# Patient Record
Sex: Female | Born: 1955 | Race: White | Hispanic: No | Marital: Married | Smoking: Former smoker
Health system: Southern US, Community
[De-identification: ages and names within clinical notes are randomized; demographics above are authoritative.]

## PROBLEM LIST (undated history)

## (undated) ENCOUNTER — Inpatient Hospital Stay: Admission: EM | Payer: Self-pay | Source: Home / Self Care

## (undated) DIAGNOSIS — N189 Chronic kidney disease, unspecified: Secondary | ICD-10-CM

## (undated) DIAGNOSIS — H269 Unspecified cataract: Secondary | ICD-10-CM

## (undated) DIAGNOSIS — I509 Heart failure, unspecified: Secondary | ICD-10-CM

## (undated) DIAGNOSIS — K529 Noninfective gastroenteritis and colitis, unspecified: Secondary | ICD-10-CM

## (undated) DIAGNOSIS — R350 Frequency of micturition: Secondary | ICD-10-CM

## (undated) DIAGNOSIS — F419 Anxiety disorder, unspecified: Secondary | ICD-10-CM

## (undated) DIAGNOSIS — K5090 Crohn's disease, unspecified, without complications: Secondary | ICD-10-CM

## (undated) DIAGNOSIS — K5790 Diverticulosis of intestine, part unspecified, without perforation or abscess without bleeding: Secondary | ICD-10-CM

## (undated) DIAGNOSIS — Z5189 Encounter for other specified aftercare: Secondary | ICD-10-CM

## (undated) DIAGNOSIS — F32A Depression, unspecified: Secondary | ICD-10-CM

## (undated) DIAGNOSIS — K743 Primary biliary cirrhosis: Secondary | ICD-10-CM

## (undated) DIAGNOSIS — N390 Urinary tract infection, site not specified: Secondary | ICD-10-CM

## (undated) DIAGNOSIS — E119 Type 2 diabetes mellitus without complications: Secondary | ICD-10-CM

## (undated) DIAGNOSIS — M1990 Unspecified osteoarthritis, unspecified site: Secondary | ICD-10-CM

## (undated) DIAGNOSIS — D649 Anemia, unspecified: Secondary | ICD-10-CM

## (undated) DIAGNOSIS — R51 Headache: Secondary | ICD-10-CM

## (undated) DIAGNOSIS — I5189 Other ill-defined heart diseases: Secondary | ICD-10-CM

## (undated) DIAGNOSIS — R748 Abnormal levels of other serum enzymes: Secondary | ICD-10-CM

## (undated) DIAGNOSIS — R519 Headache, unspecified: Secondary | ICD-10-CM

## (undated) DIAGNOSIS — R112 Nausea with vomiting, unspecified: Secondary | ICD-10-CM

## (undated) DIAGNOSIS — N3010 Interstitial cystitis (chronic) without hematuria: Secondary | ICD-10-CM

## (undated) DIAGNOSIS — G459 Transient cerebral ischemic attack, unspecified: Secondary | ICD-10-CM

## (undated) DIAGNOSIS — Z9889 Other specified postprocedural states: Secondary | ICD-10-CM

## (undated) DIAGNOSIS — G2581 Restless legs syndrome: Secondary | ICD-10-CM

## (undated) DIAGNOSIS — F329 Major depressive disorder, single episode, unspecified: Secondary | ICD-10-CM

## (undated) HISTORY — DX: Noninfective gastroenteritis and colitis, unspecified: K52.9

## (undated) HISTORY — DX: Unspecified osteoarthritis, unspecified site: M19.90

## (undated) HISTORY — DX: Encounter for other specified aftercare: Z51.89

## (undated) HISTORY — DX: Headache, unspecified: R51.9

## (undated) HISTORY — DX: Type 2 diabetes mellitus without complications: E11.9

## (undated) HISTORY — DX: Anxiety disorder, unspecified: F41.9

## (undated) HISTORY — DX: Crohn's disease, unspecified, without complications: K50.90

## (undated) HISTORY — DX: Heart failure, unspecified: I50.9

## (undated) HISTORY — DX: Major depressive disorder, single episode, unspecified: F32.9

## (undated) HISTORY — PX: COLONOSCOPY: SHX174

## (undated) HISTORY — DX: Unspecified cataract: H26.9

## (undated) HISTORY — DX: Interstitial cystitis (chronic) without hematuria: N30.10

## (undated) HISTORY — DX: Transient cerebral ischemic attack, unspecified: G45.9

## (undated) HISTORY — DX: Headache: R51

## (undated) HISTORY — DX: Urinary tract infection, site not specified: N39.0

## (undated) HISTORY — DX: Depression, unspecified: F32.A

## (undated) HISTORY — PX: FL INJ LEFT KNEE CT ARTHROGRAM (ARMC HX): HXRAD1307

## (undated) HISTORY — DX: Frequency of micturition: R35.0

## (undated) HISTORY — PX: ABDOMINAL HYSTERECTOMY: SHX81

## (undated) HISTORY — PX: CARPAL TUNNEL RELEASE: SHX101

## (undated) HISTORY — PX: TONSILLECTOMY: SUR1361

## (undated) HISTORY — PX: TOTAL ABDOMINAL HYSTERECTOMY W/ BILATERAL SALPINGOOPHORECTOMY: SHX83

---
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
## 1994-07-30 HISTORY — PX: KNEE ARTHROSCOPY: SUR90

## 2000-09-21 ENCOUNTER — Emergency Department (HOSPITAL_COMMUNITY): Admission: EM | Admit: 2000-09-21 | Discharge: 2000-09-21 | Payer: Self-pay | Admitting: Emergency Medicine

## 2001-07-30 HISTORY — PX: CARPAL TUNNEL RELEASE: SHX101

## 2003-12-30 ENCOUNTER — Other Ambulatory Visit: Payer: Self-pay

## 2004-09-03 ENCOUNTER — Emergency Department: Payer: Self-pay | Admitting: Emergency Medicine

## 2005-03-26 ENCOUNTER — Ambulatory Visit: Payer: Self-pay | Admitting: General Practice

## 2005-03-30 ENCOUNTER — Ambulatory Visit: Payer: Self-pay | Admitting: General Practice

## 2005-04-23 ENCOUNTER — Ambulatory Visit: Payer: Self-pay | Admitting: Family Medicine

## 2005-04-23 IMAGING — CT CT ABD-PELV W/ CM
1 of 2 series · 15 of 32 positions shown, 20 images · non-contrast
Comparison: none

REASON FOR EXAM: ACUTE ABDOMINAL PAIN
 Call report to:[[email or web address]_NUMBER]
COMMENTS:

PROCEDURE:     CT  - CT ABDOMEN / PELVIS  W  - [DATE]  [DATE]
RESULT:     Standard IV and oral contrast enhanced CT of abdomen and pelvis
is obtained.

[Series 2: abdomen · axial · 0.81mm/px · z∈[-491,-75]mm · 15 of 58 slices shown, 20 images]
[im 3/58  soft-tissue]
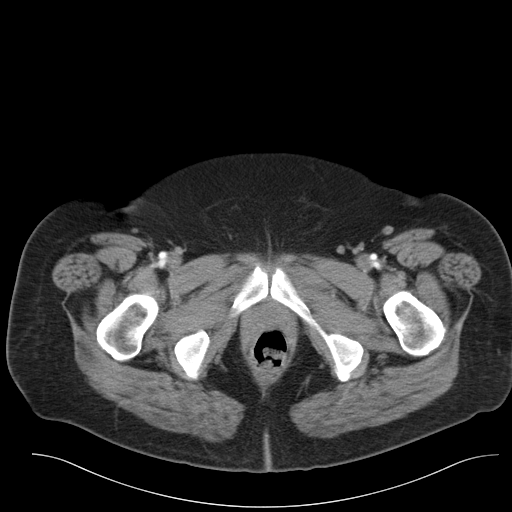
[im 3/58  bone]
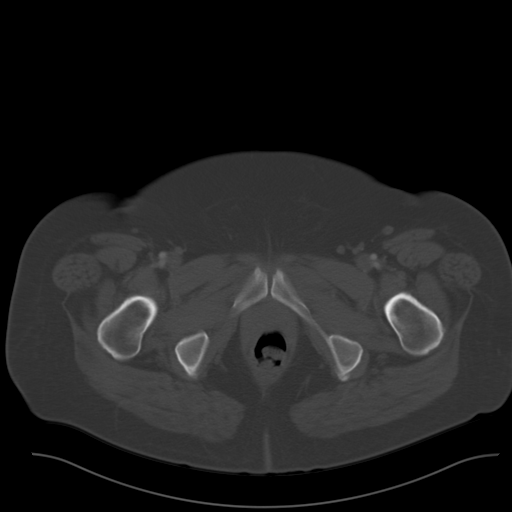
[im 8/58  soft-tissue]
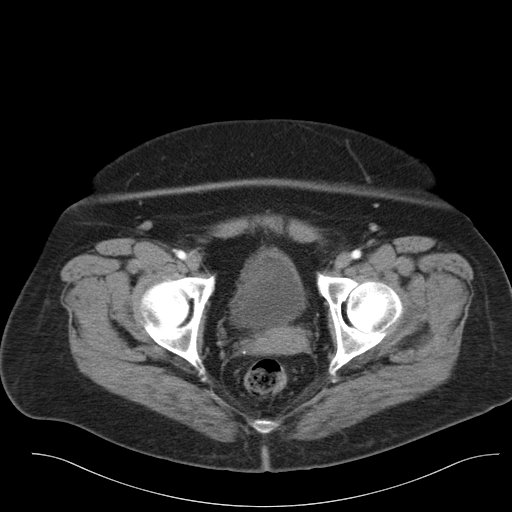
[im 10/58  soft-tissue]
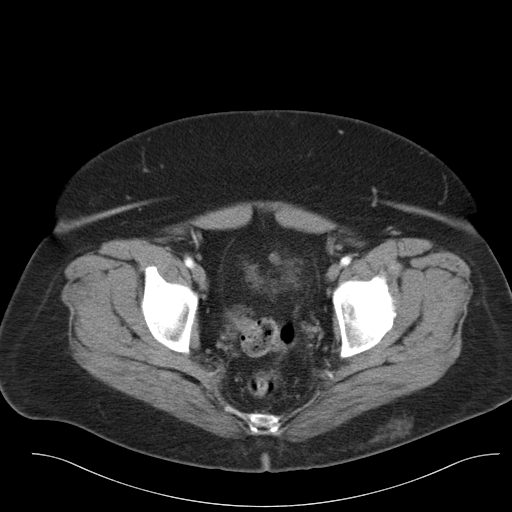
[im 15/58  soft-tissue]
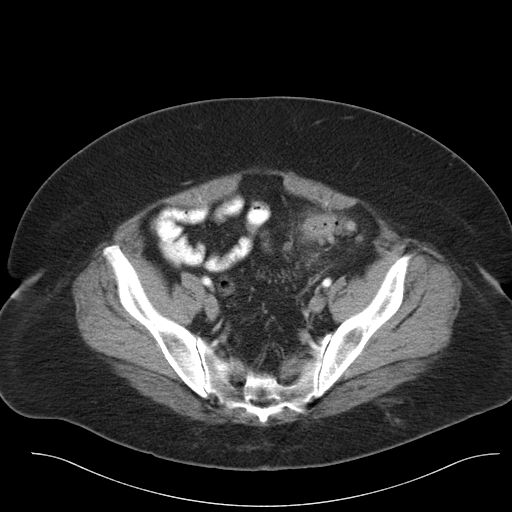
[im 20/58  soft-tissue]
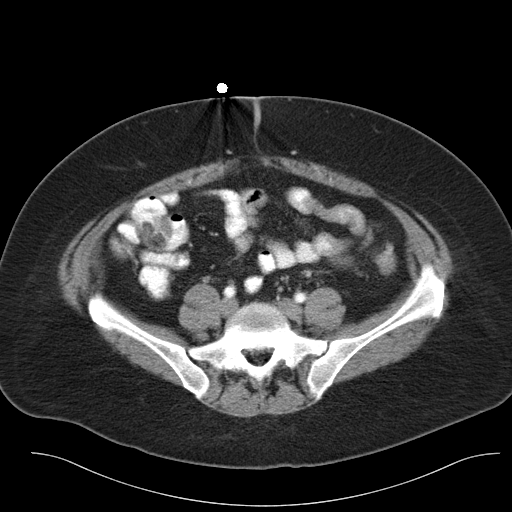
[im 23/58  soft-tissue]
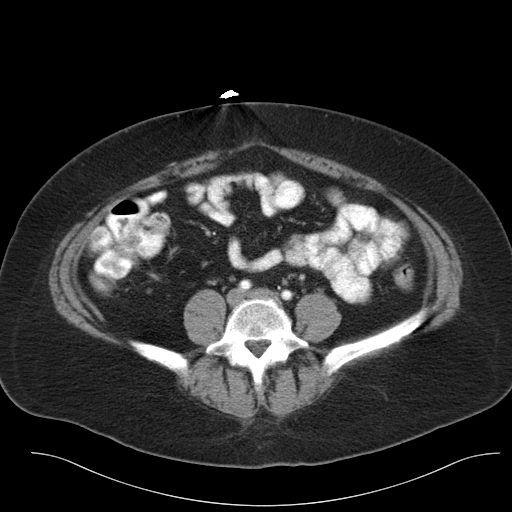
[im 28/58  soft-tissue]
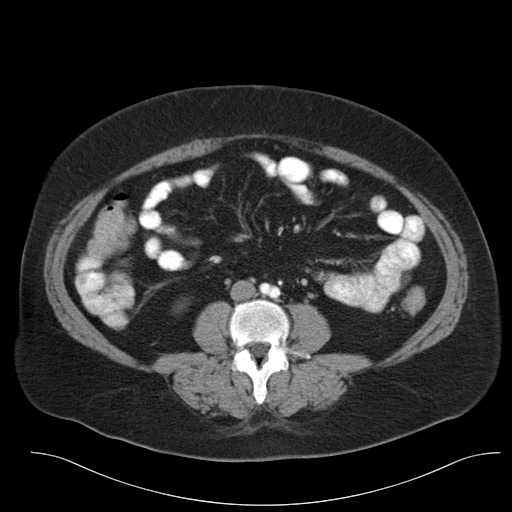
[im 30/58  soft-tissue]
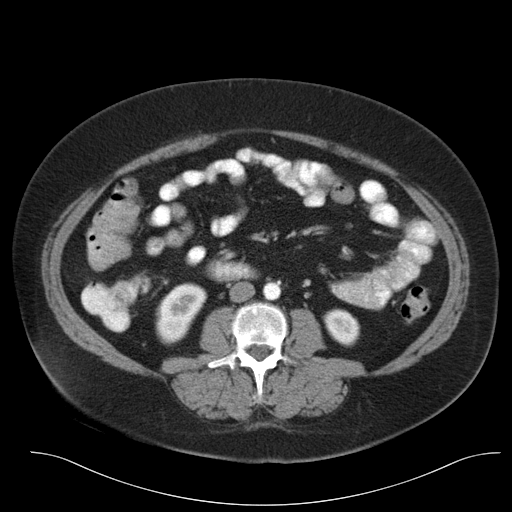
[im 35/58  soft-tissue]
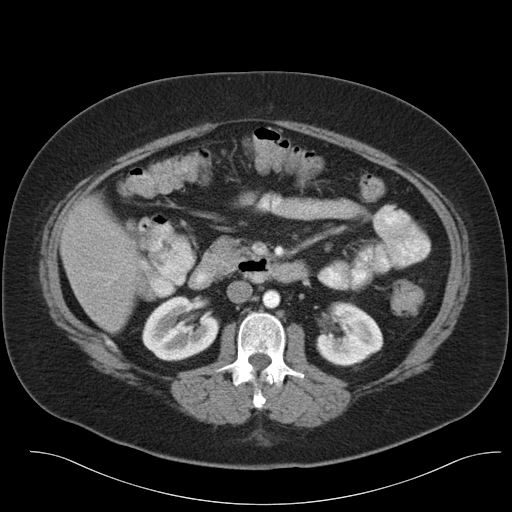
[im 35/58  bone]
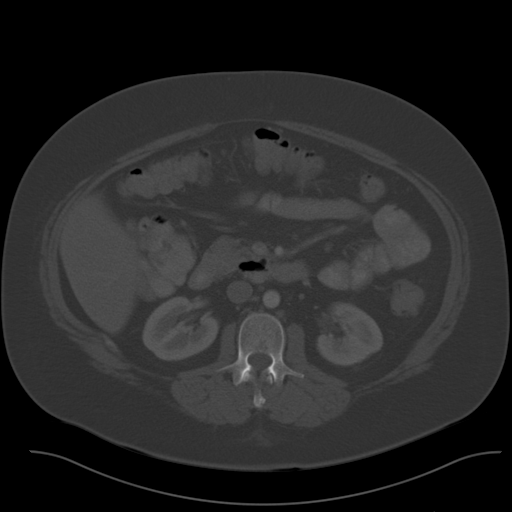
[im 38/58  soft-tissue]
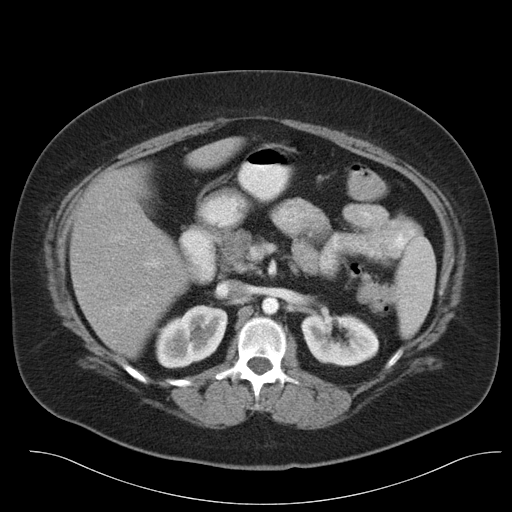
[im 43/58  soft-tissue]
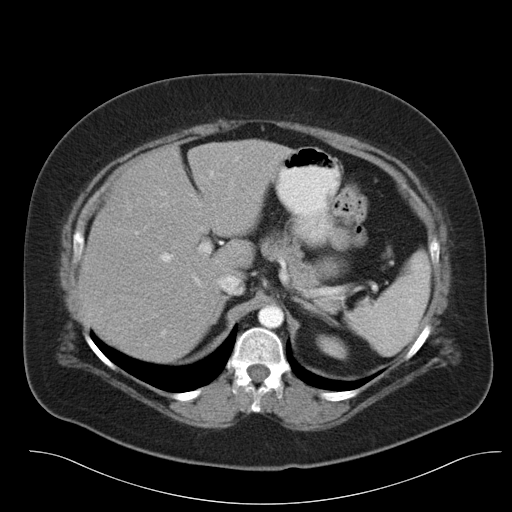
[im 48/58  soft-tissue]
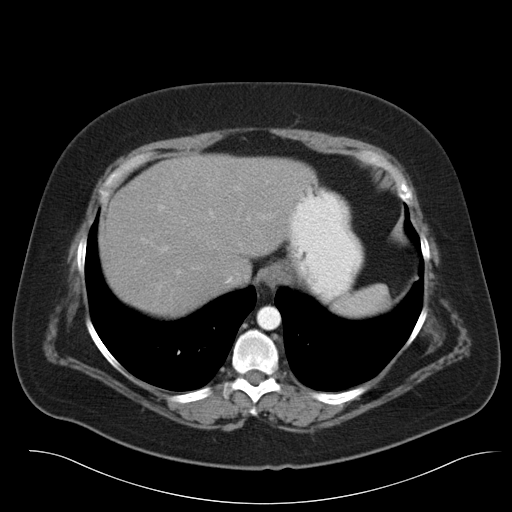
[im 48/58  lung]
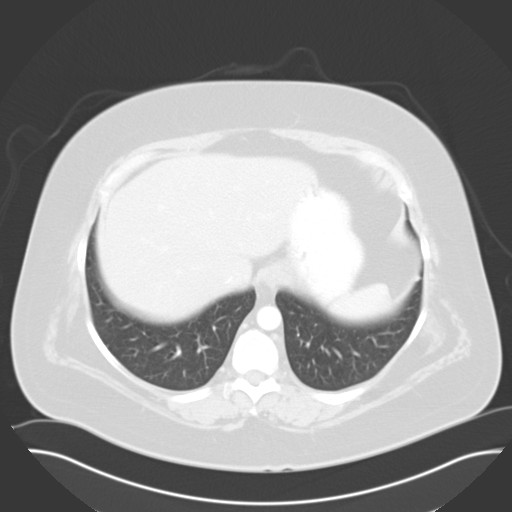
[im 50/58  soft-tissue]
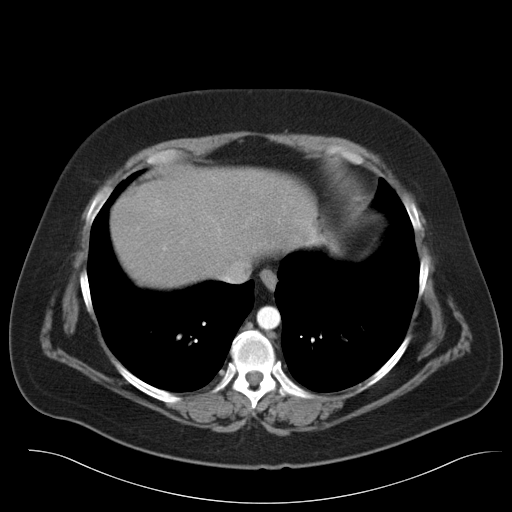
[im 50/58  lung]
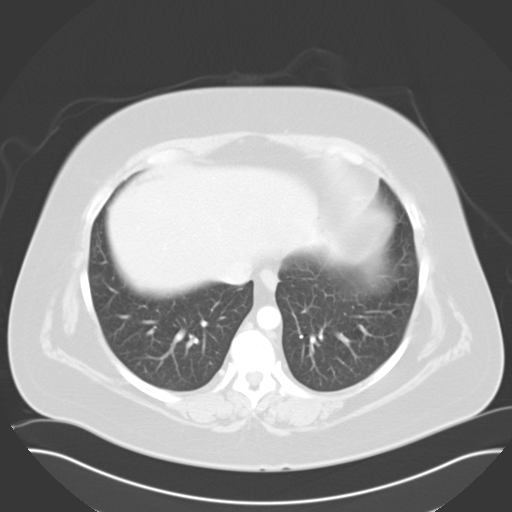
[im 53/58  lung]
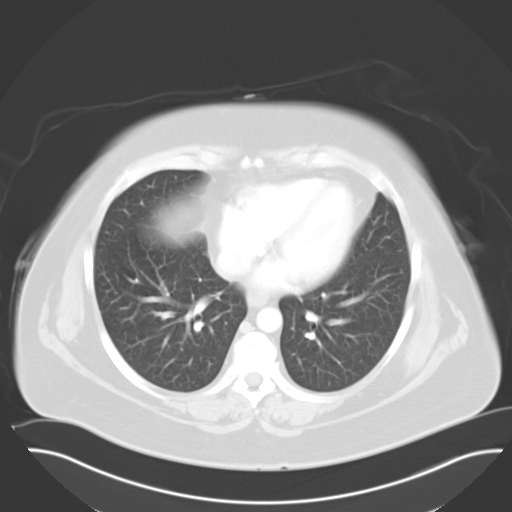
[im 55/58  soft-tissue]
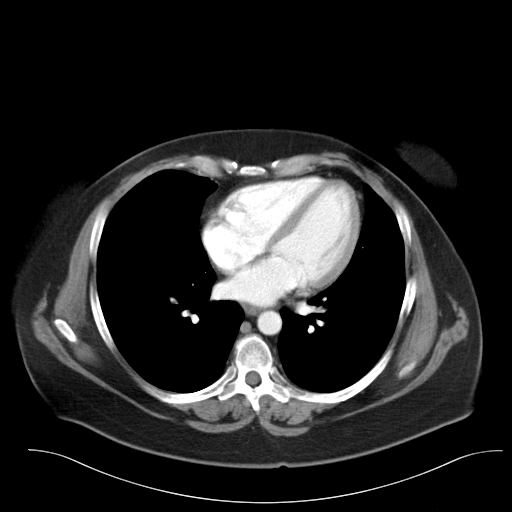
[im 55/58  lung]
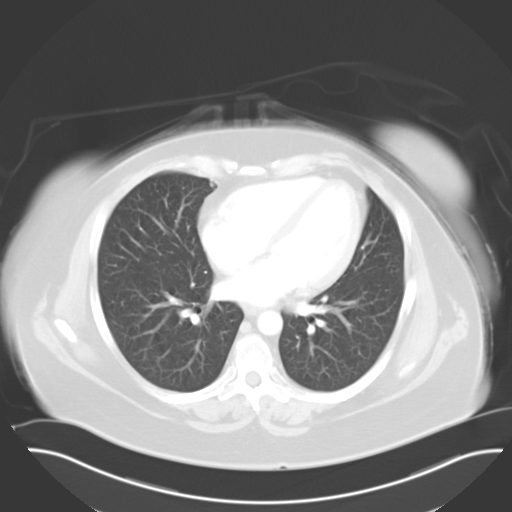

[15 of 32 positions shown; findings below may reference images not displayed]

FINDINGS: The liver and spleen are normal. The pancreas is normal. The
adrenals are normal. No focal renal abnormalities are identified. There is
no bowel distension. The RIGHT lower quadrant is unremarkable. Sigmoid
colonic diverticulosis is noted. Mild diverticulitis cannot be excluded as
there is mild perisigmoid colonic fat plane streaking. The pelvis is
otherwise unremarkable. No inguinal adenopathy is noted. The lung bases are
clear.
IMPRESSION: Subtle diverticulitis cannot be excluded.

This report was phoned to the physician at the time of the study.

## 2005-04-29 ENCOUNTER — Ambulatory Visit: Payer: Self-pay | Admitting: General Practice

## 2005-04-30 ENCOUNTER — Inpatient Hospital Stay: Payer: Self-pay | Admitting: Internal Medicine

## 2005-04-30 IMAGING — CR DG ABDOMEN 3V
1 series · 4 of 4 positions shown · non-contrast
Comparison: none

REASON FOR EXAM: please do tonight  abdominal pain
COMMENTS:

[Series 1: view not recorded · 0.17mm/px · 4 of 4 slices shown]
[im 1/4]
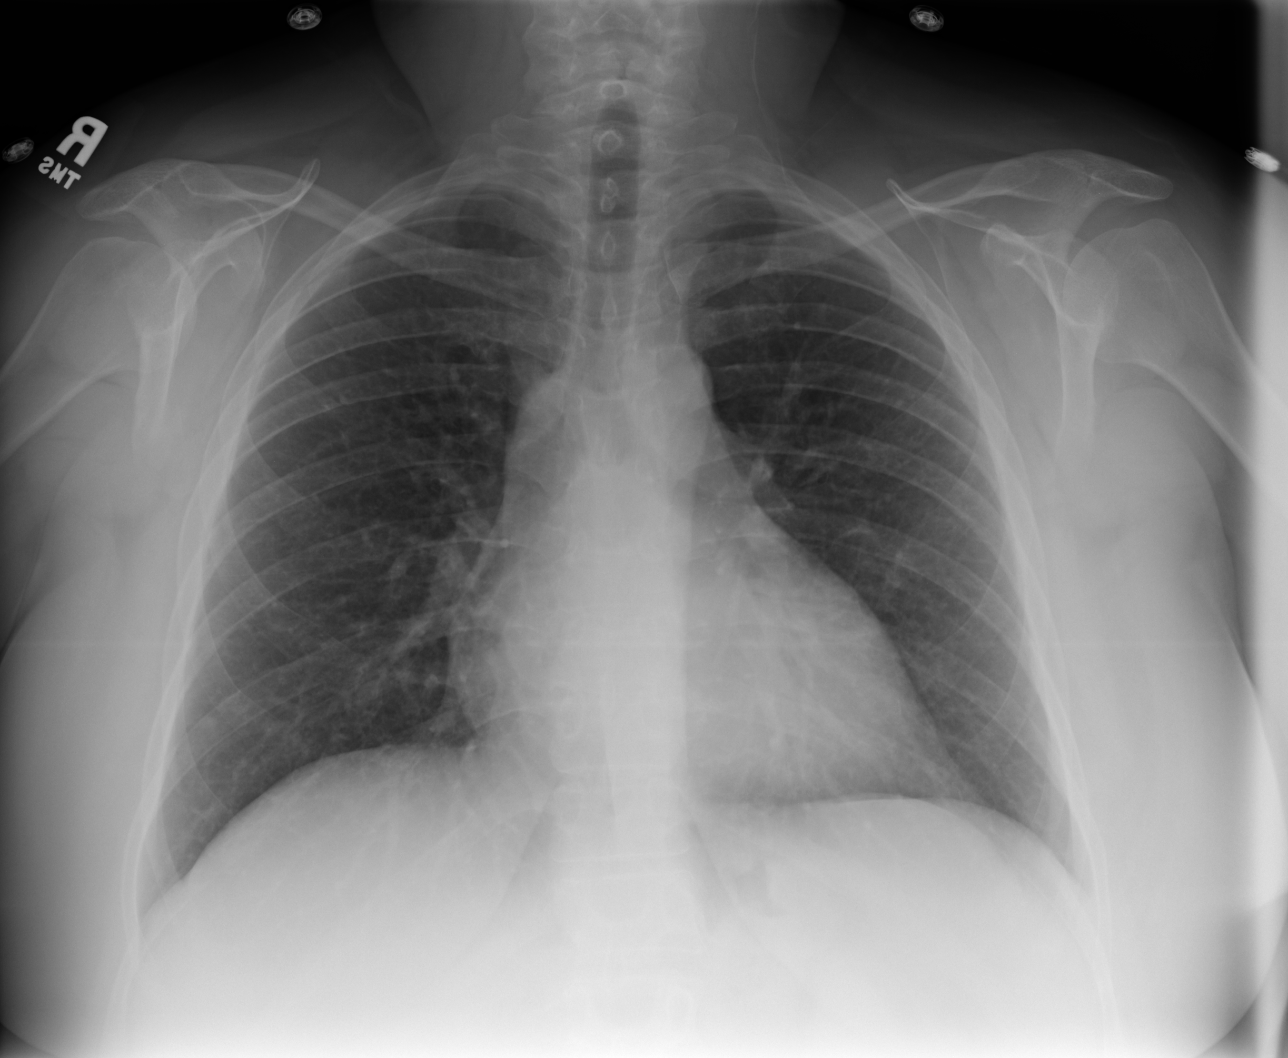
[im 2/4]
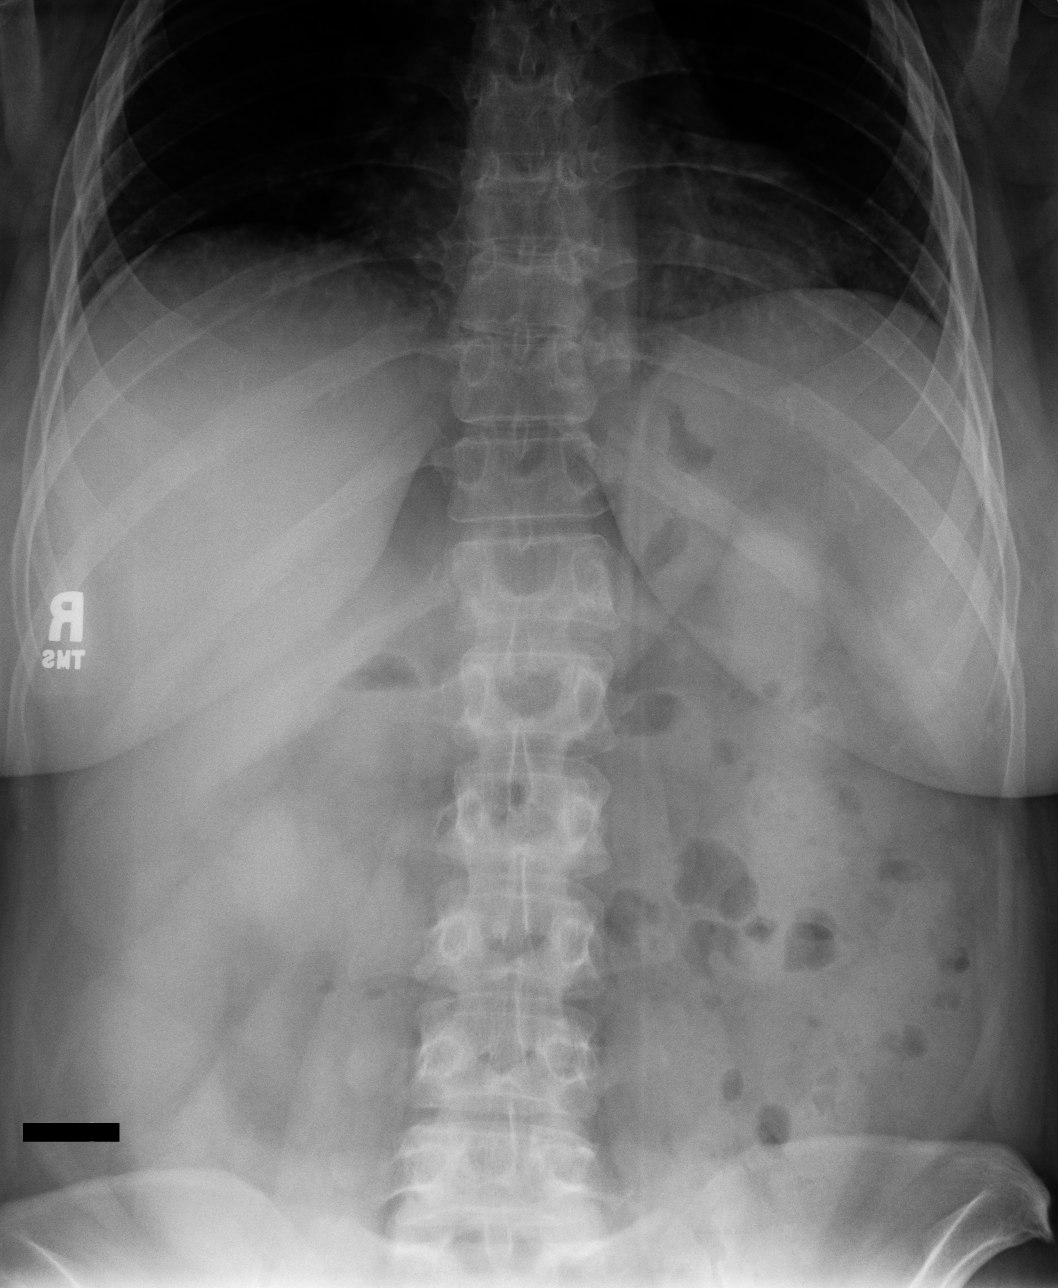
[im 3/4]
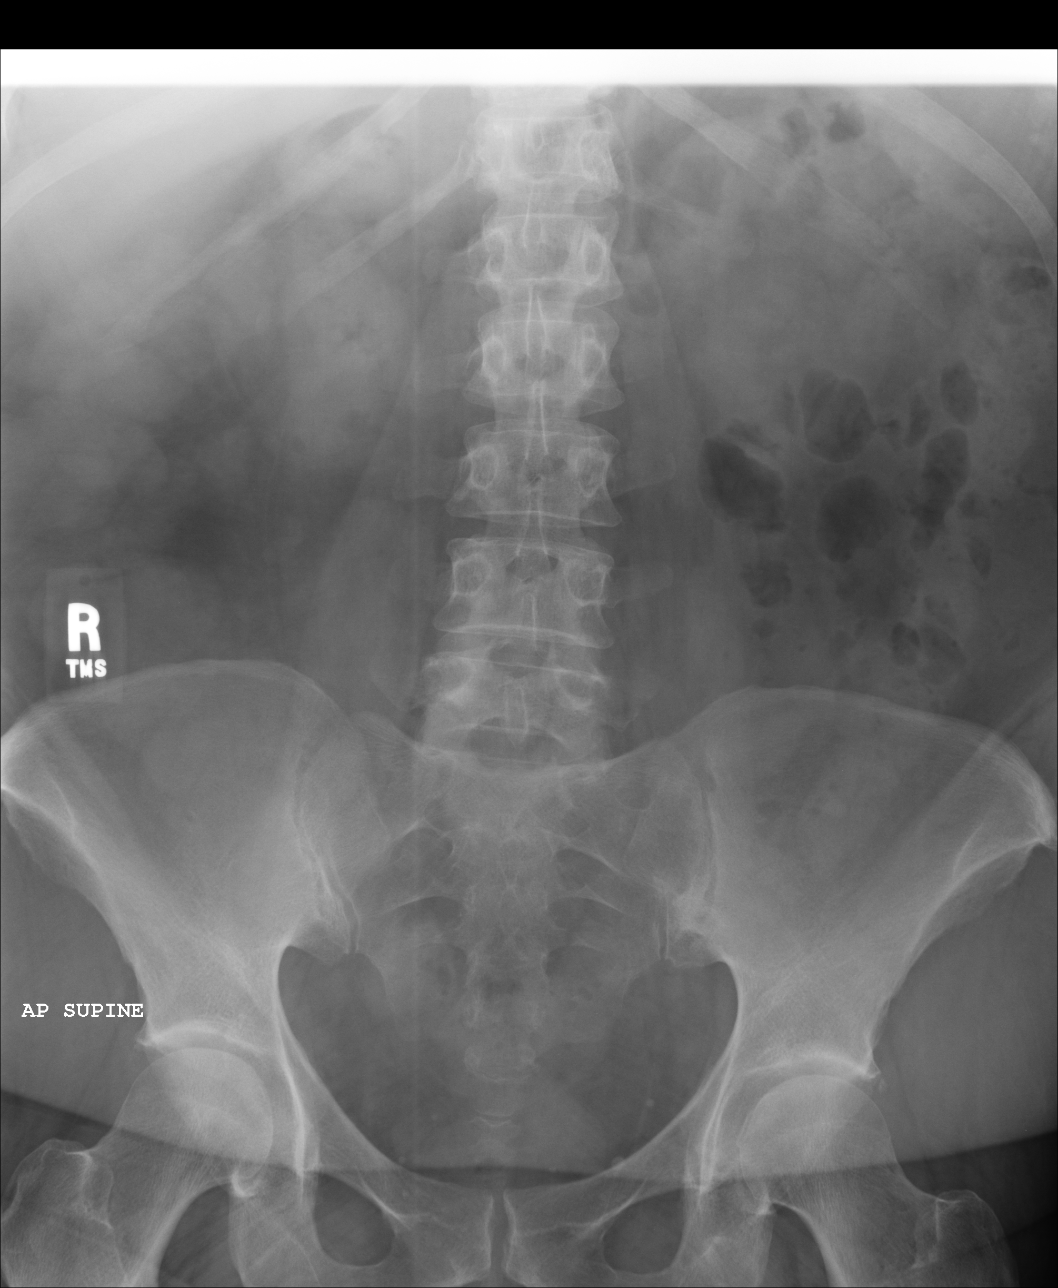
[im 4/4]
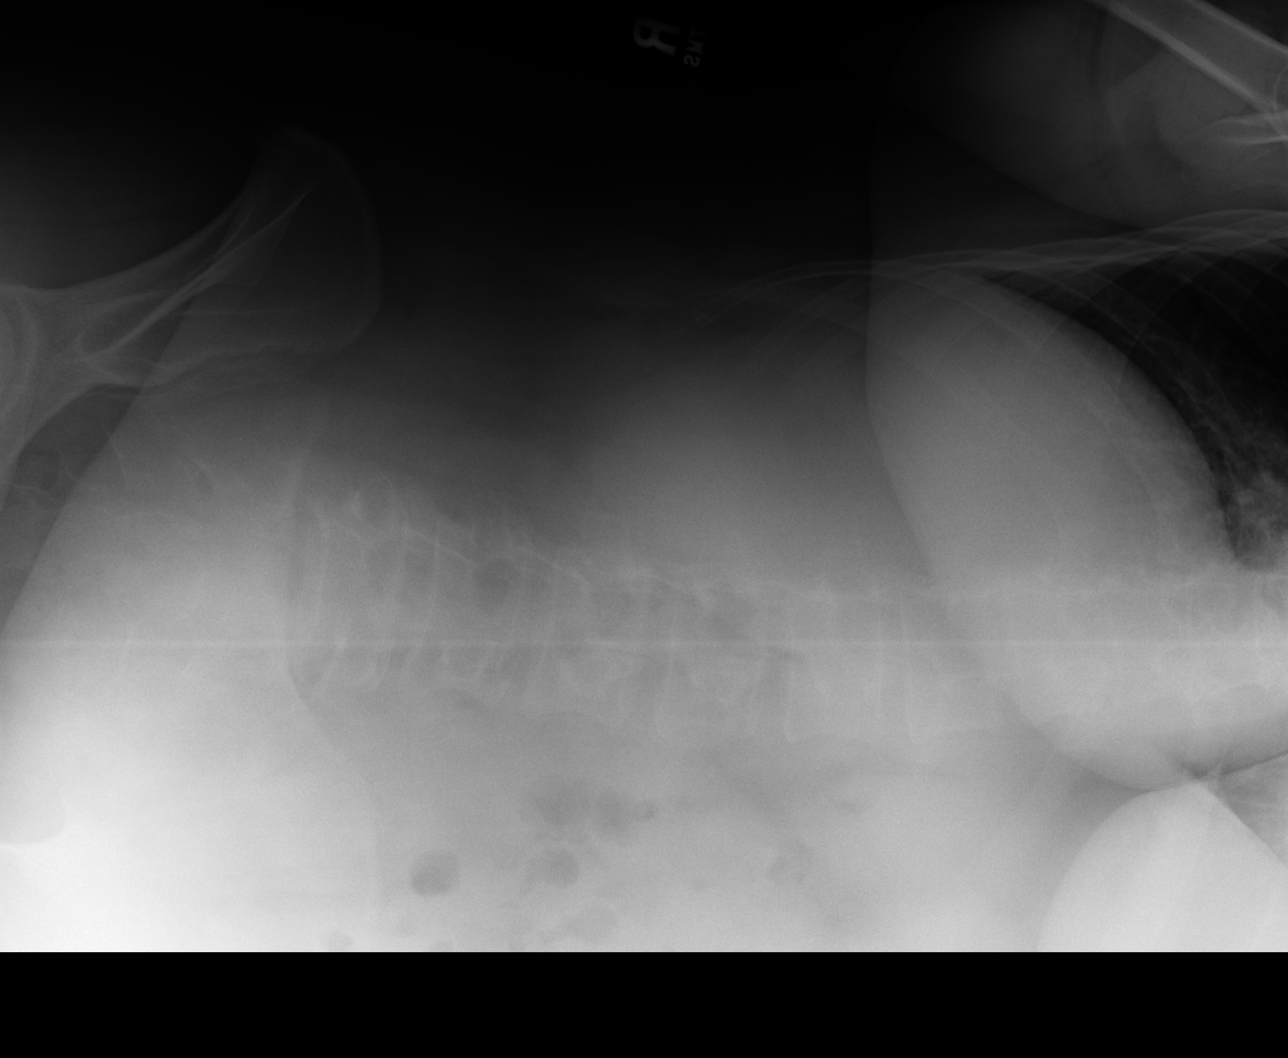

[4 of 4 positions shown; findings below may reference images not displayed]

PROCEDURE:     DXR - DXR ABDOMEN 3-WAY (INCL PA CXR)  - [DATE] [DATE]

RESULT:     PA view of the chest shows the lung fields to be clear.  The
heart size is normal. Three views of the abdomen were obtained. No
subdiaphragmatic free air is seen. The bowel gas pattern shows no specific
abnormalities. No significantly dilated loops of bowel are noted.  No
abnormal intraabdominal calcifications are seen. The osseous structures are
normal in appearance.
IMPRESSION: 1)No specific abnormalities are identified.

## 2005-05-01 IMAGING — CT CT ABD-PELV W/O CM
1 of 2 series · 15 of 32 positions shown, 19 images · non-contrast
Comparison: none

REASON FOR EXAM: LEFT lower quadrant and midabdominal pain
COMMENTS:

[Series 2: abdomen · axial · 0.76mm/px · z∈[-625,-210]mm · 15 of 91 slices shown, 19 images]
[im 4/91  soft-tissue]
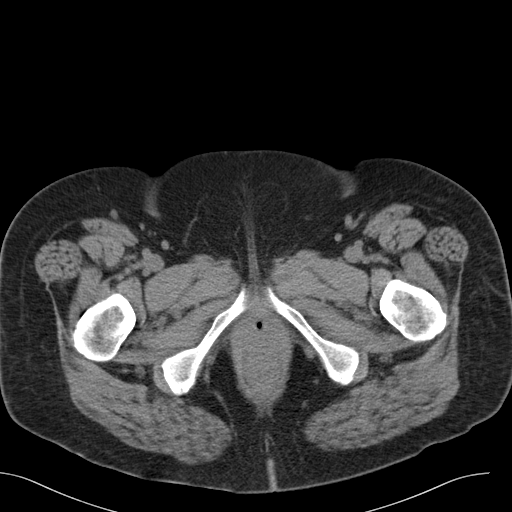
[im 4/91  bone]
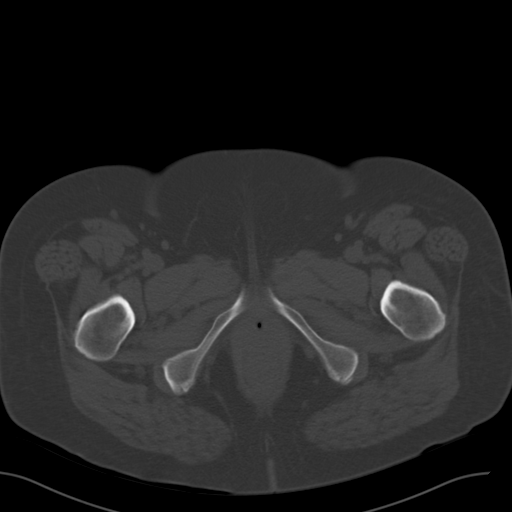
[im 12/91  soft-tissue]
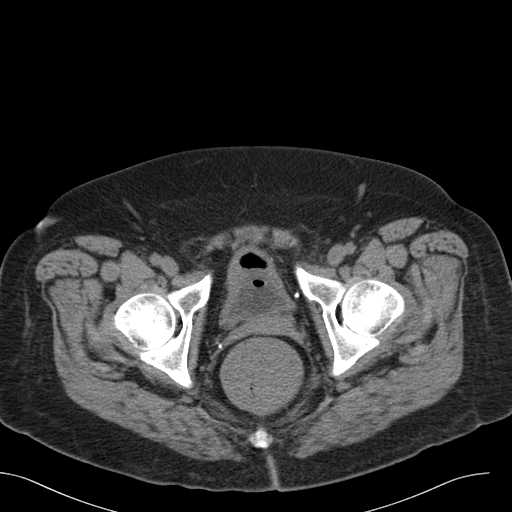
[im 19/91  soft-tissue]
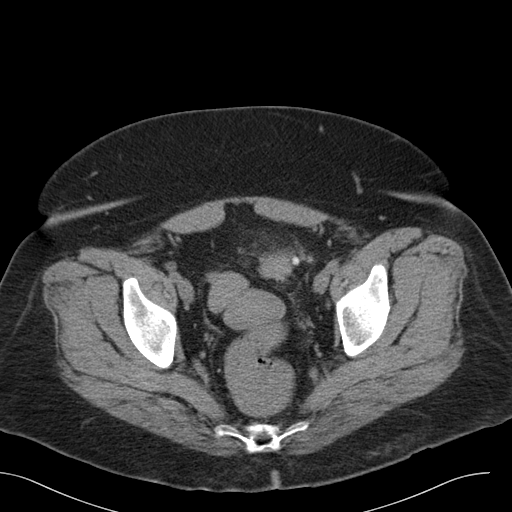
[im 27/91  soft-tissue]
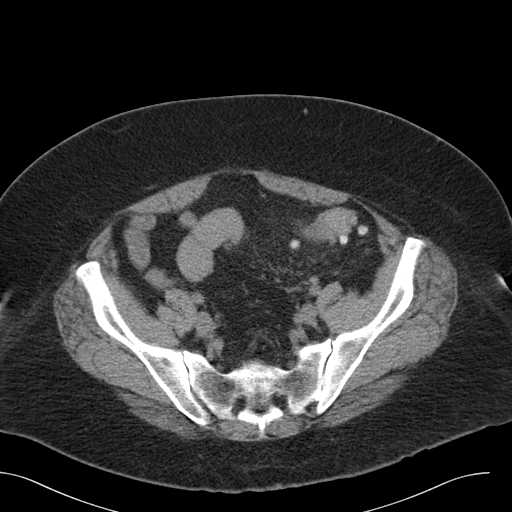
[im 31/91  soft-tissue]
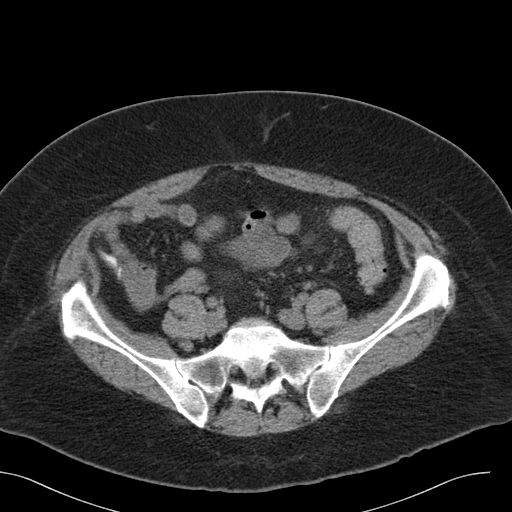
[im 38/91  soft-tissue]
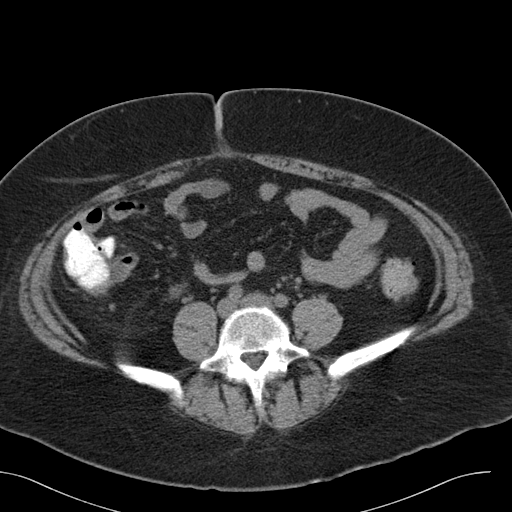
[im 46/91  soft-tissue]
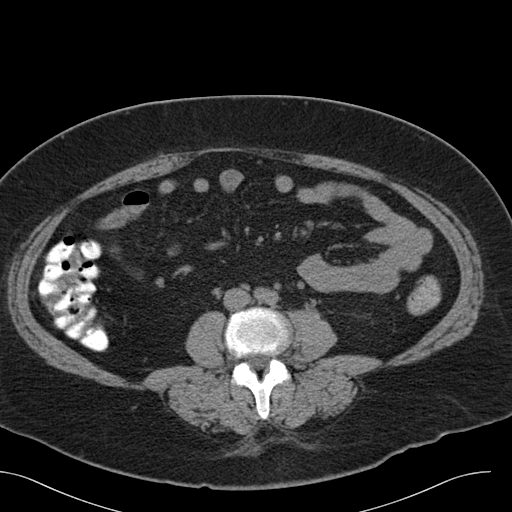
[im 53/91  soft-tissue]
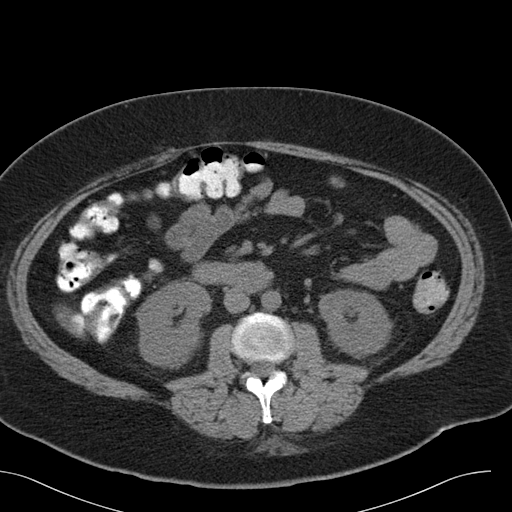
[im 61/91  soft-tissue]
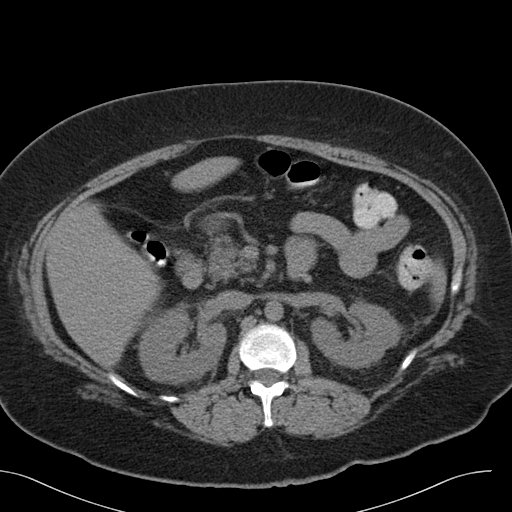
[im 61/91  bone]
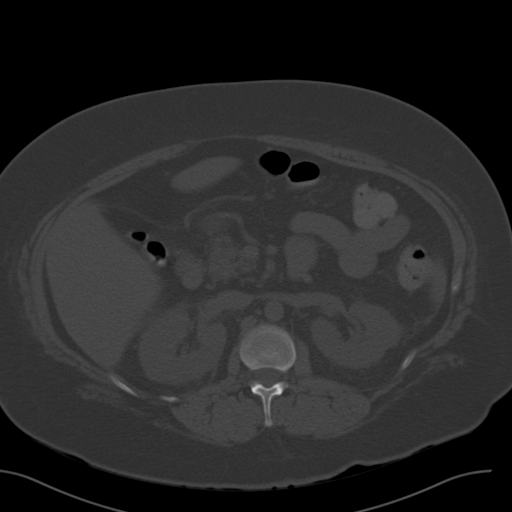
[im 64/91  soft-tissue]
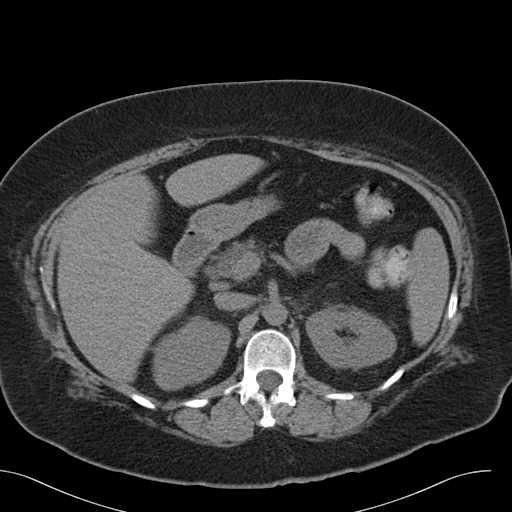
[im 72/91  soft-tissue]
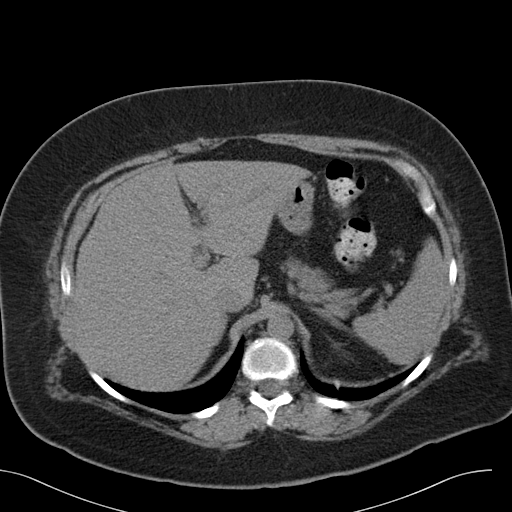
[im 76/91  lung]
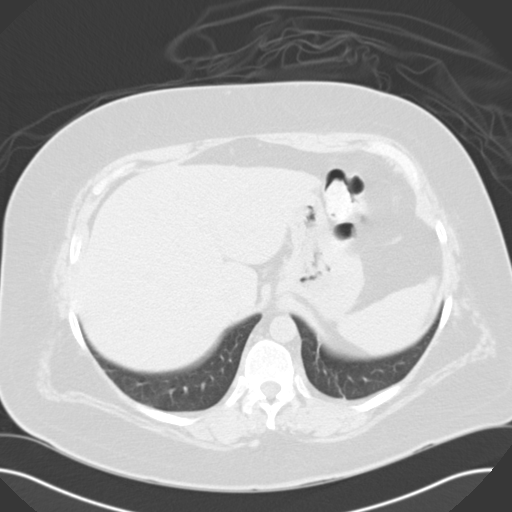
[im 79/91  soft-tissue]
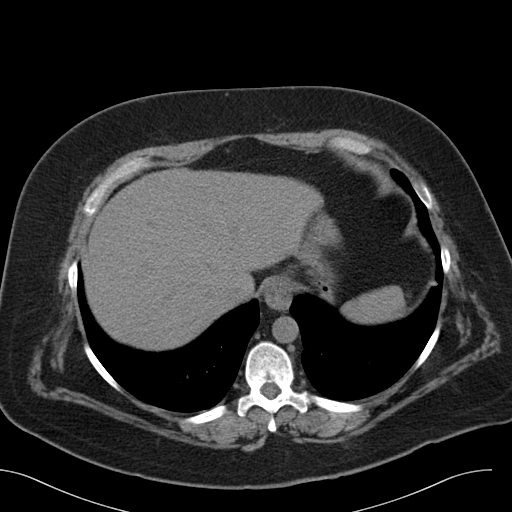
[im 79/91  lung]
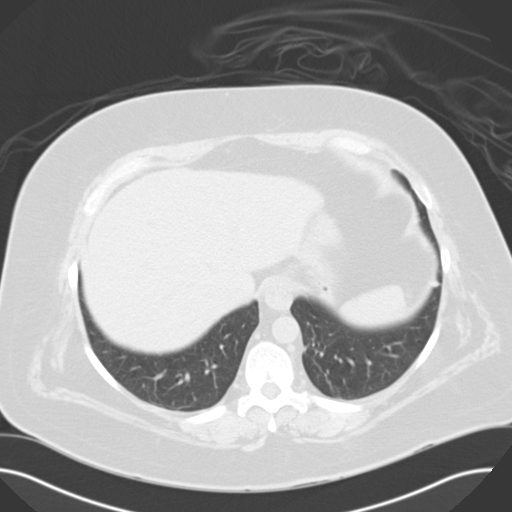
[im 83/91  lung]
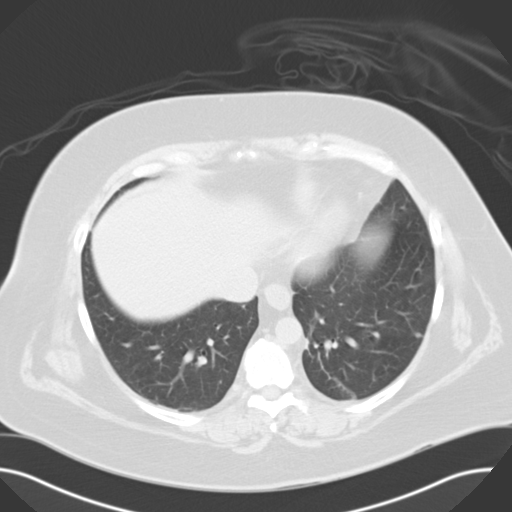
[im 87/91  soft-tissue]
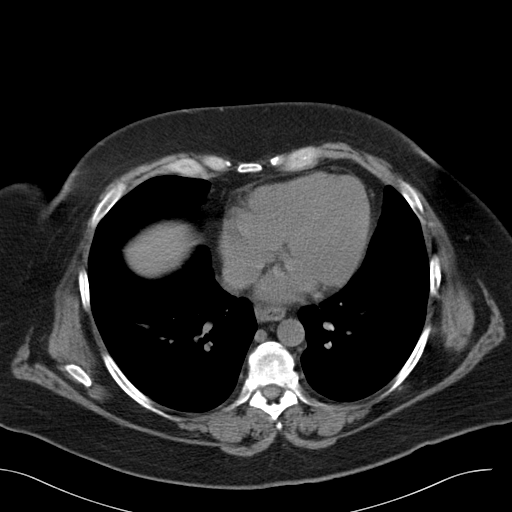
[im 87/91  lung]
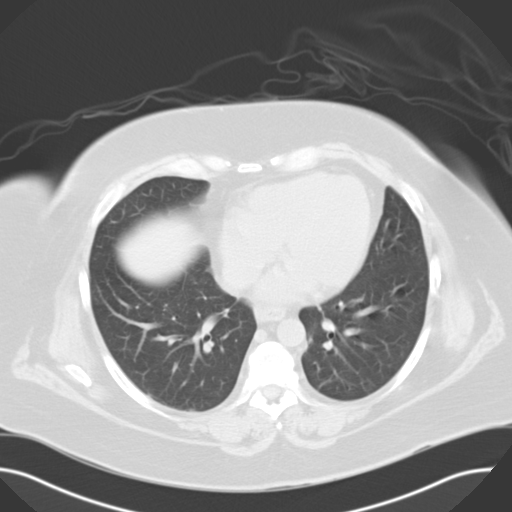

[15 of 32 positions shown; findings below may reference images not displayed]

PROCEDURE:     CT  - CT ABDOMEN AND PELVIS W[DATE] [DATE]

RESULT:     Axial images were obtained from the hemidiaphragm to the pubic
symphysis.  No IV contrast was administered since the patient's creatinine
is elevated at 1.9.  There is some oral contrast although the patient
apparently continued to vomit the oral contrast.

No major organ abnormalities are identified.  No obvious hydronephrosis is
noted.  No intraabdominal or retroperitoneal masses are seen.  There are
noted multiple diverticula in the descending colon and sigmoid colon
regions.  No fluid collections or drainable loculated fluid is noted in the
abdomen or pelvis.

The images through the lung bases reveal the lung bases to be clear. There
are no effusions noted.
IMPRESSION: Diverticulosis in the descending and sigmoid colon regions.  No definite
evidence of diverticulitis seen. No fluid collections are seen in the
abdomen or pelvis.

## 2006-01-18 ENCOUNTER — Emergency Department: Payer: Self-pay | Admitting: Emergency Medicine

## 2006-01-18 ENCOUNTER — Other Ambulatory Visit: Payer: Self-pay

## 2007-08-20 ENCOUNTER — Emergency Department: Payer: Self-pay | Admitting: Emergency Medicine

## 2007-12-02 ENCOUNTER — Ambulatory Visit: Payer: Self-pay | Admitting: Surgery

## 2007-12-02 IMAGING — RF DG UGI W/O KUB
1 series · 14 of 24 positions shown · non-contrast
Comparison: none

RESULT:      Comparison: No available comparison exam.

Procedure:
Following the ingestion of gas-forming crystals, the patient ingested thick
barium in the upright position. Multiple fluoroscopic spot images of the
esophagus were obtained. The patient was placed in a prone position, and
turned from right lateral decubitus through the supine position to left
lateral decubitus. Multiple fluoroscopic spot images of the stomach and
duodenum were obtained. Following this, the patient was placed in a prone
right side down oblique position and thin barium was ingested while multiple
fluoroscopic spot images of the esophagus were obtained. Patient swallowed a
13 mm barium pill at the end of the exam.

[Series 1: run · 23 acquisitions, 14 frames shown]
[im 1/23]
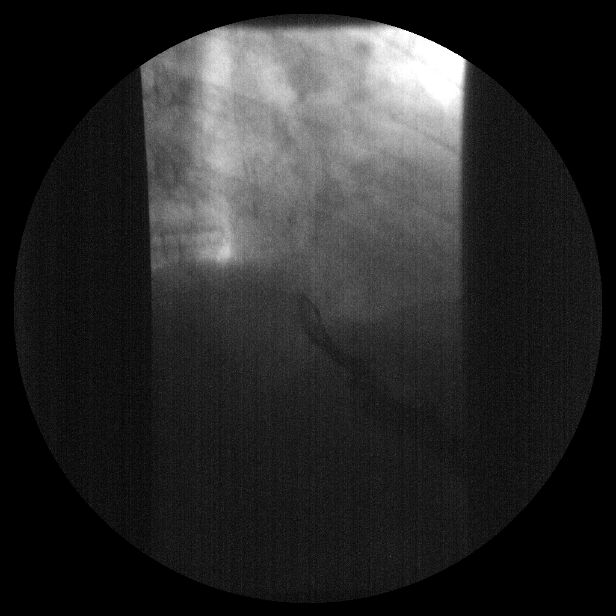
[im 2/23]
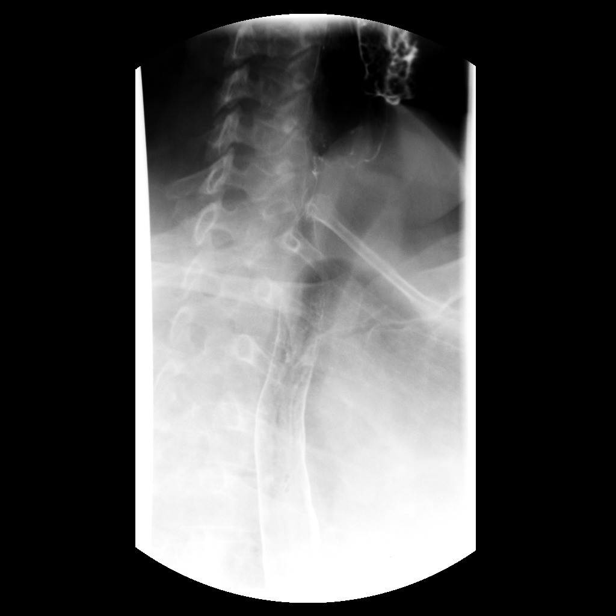
[im 3/23]
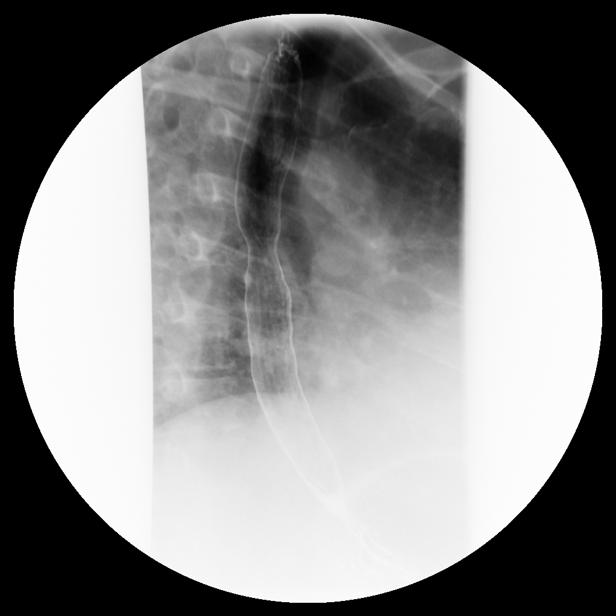
[im 5/23]
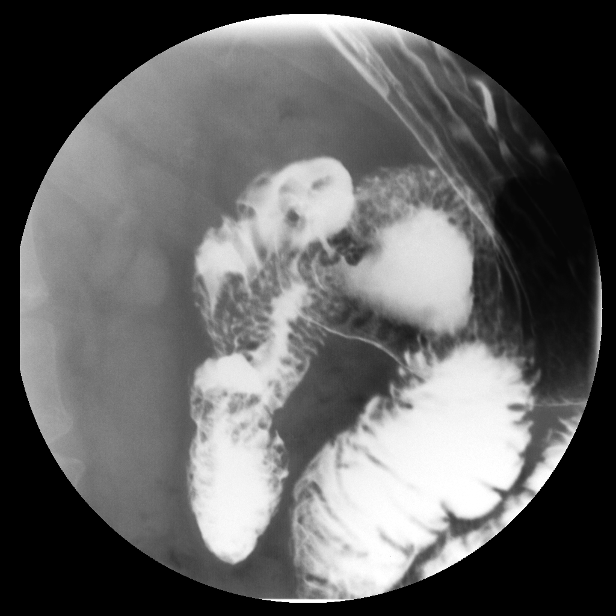
[im 6/23]
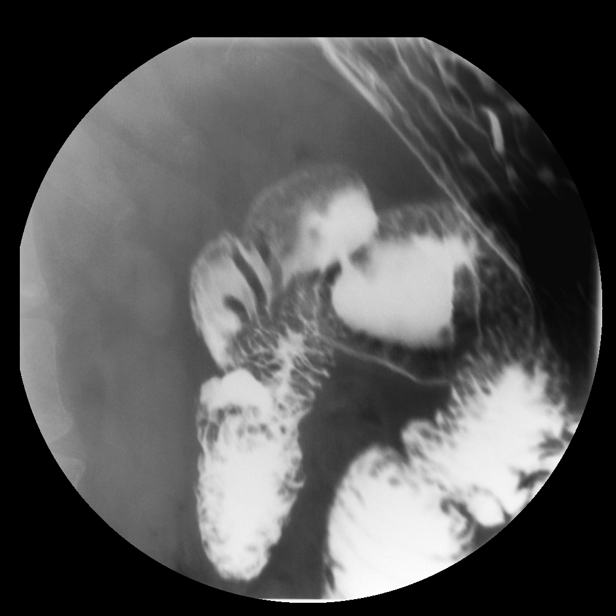
[im 9/23]
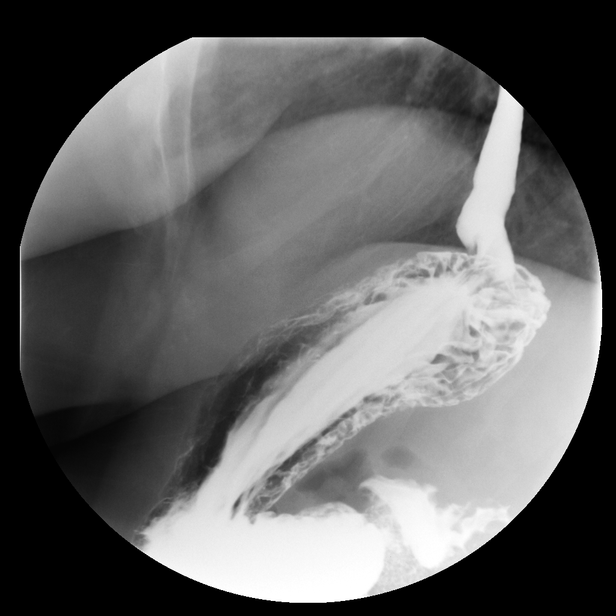
[im 13/23]
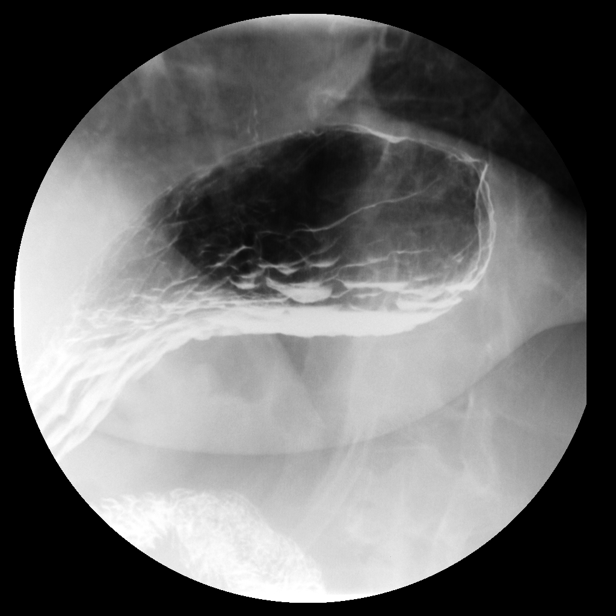
[im 14/23]
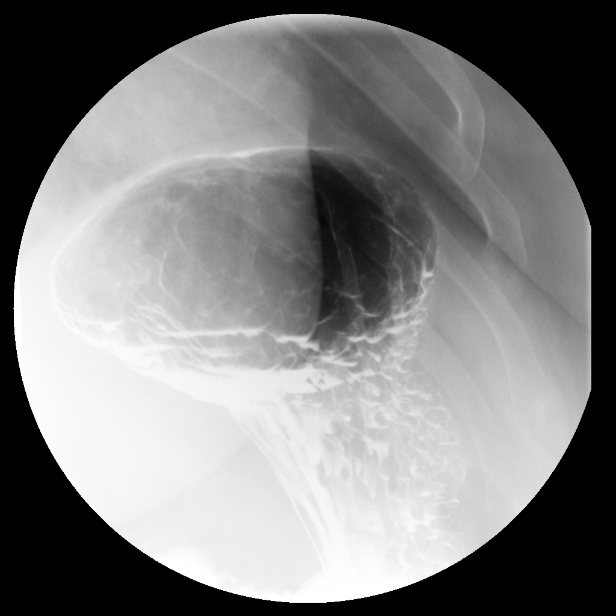
[im 15/23]
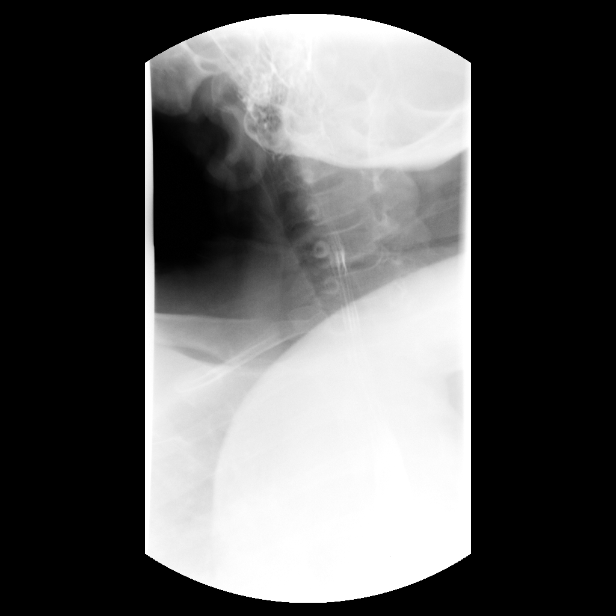
[im 16/23]
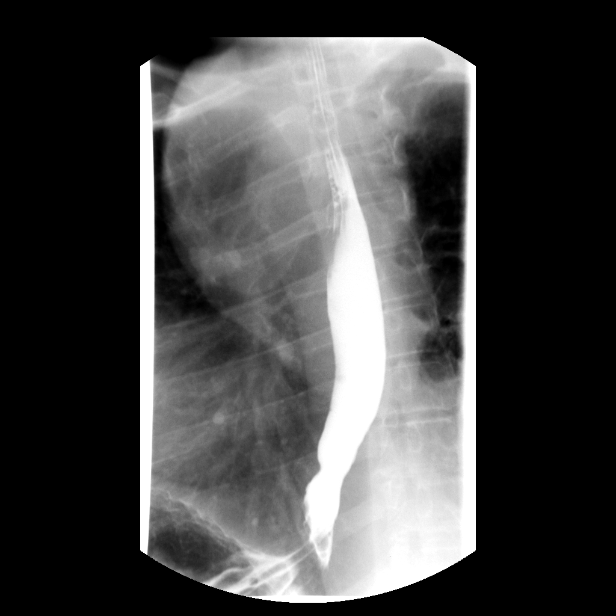
[im 17/23]
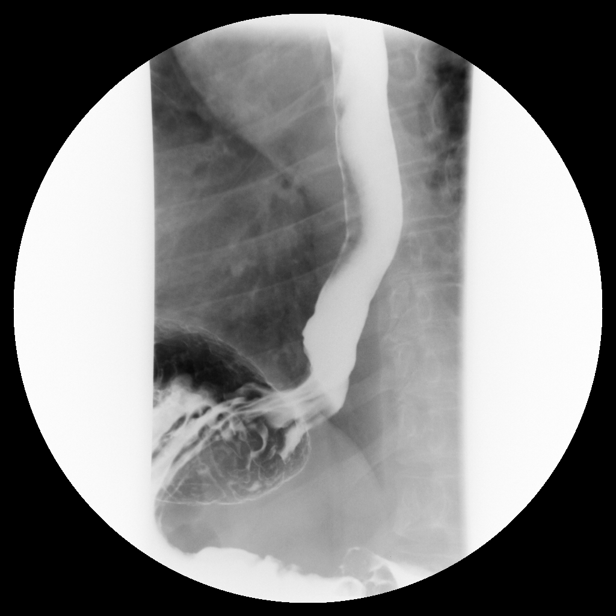
[im 17/23]
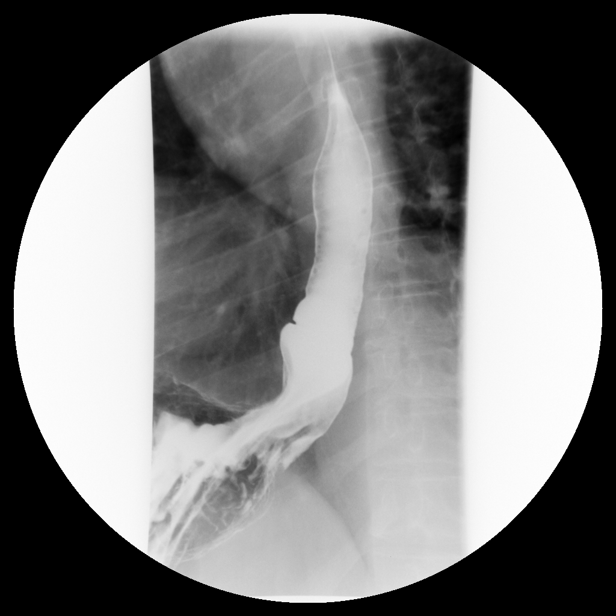
[im 20/23]
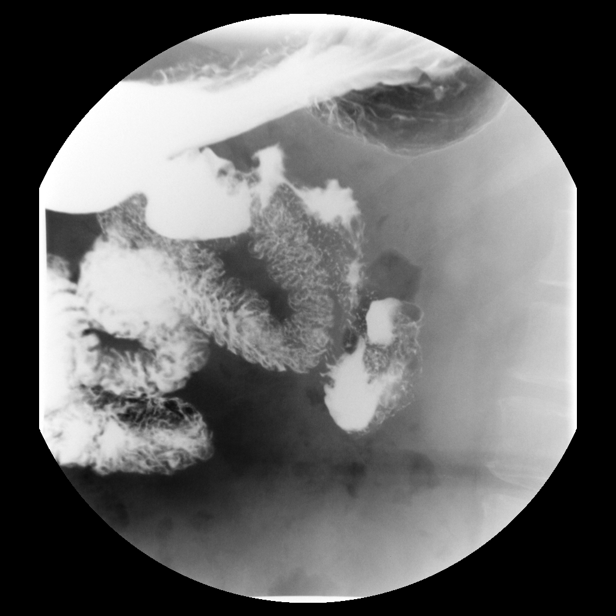
[im 23/23]
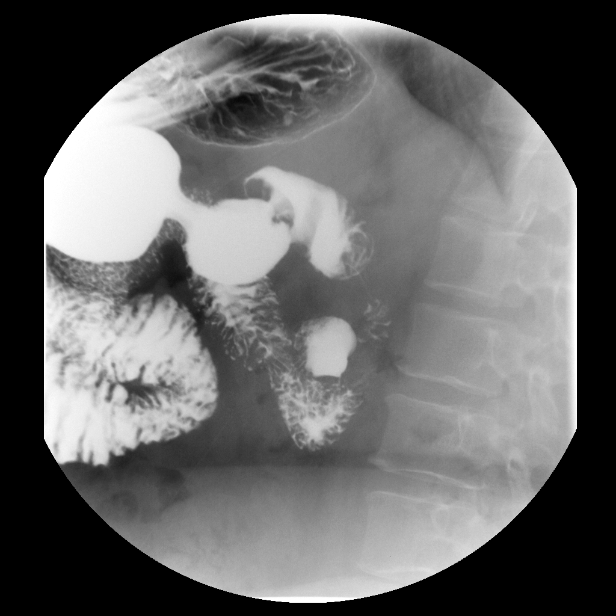

[14 of 24 positions shown; findings below may reference images not displayed]

FINDINGS: The swallowing function is grossly normal. There is normal esophageal
motility. There is a small hiatal hernia. Gastroesophageal reflux is noted.
There is a diverticulum along the inner aspect of the 2nd/3rd duodenal
junction, measuring approximately 2.5 cm. There is no evidence of stricture,
mass, or gross ulceration. 13 mm barium pill passed without difficulty from
the esophagus into the stomach.
IMPRESSION: 1. Small sliding hiatal hernia.
2. Gastroesophageal reflux
3. Duodenal diverticulum.

## 2008-07-30 HISTORY — PX: GASTRIC BYPASS: SHX52

## 2008-09-02 ENCOUNTER — Emergency Department: Payer: Self-pay | Admitting: Emergency Medicine

## 2008-09-02 IMAGING — CT CT ABD-PELV W/O CM
1 of 2 series · 15 of 32 positions shown, 19 images · non-contrast
Comparison: none

REASON FOR EXAM: (1) LLQ/L FLANK PAIN AND URINARY RETENTION; (2) EVAL
DIVERTICULITIS, KIDNEY STONE
COMMENTS:

[Series 2: stone · axial · 0.75mm/px · z∈[-177,+225]mm · 15 of 146 slices shown, 19 images]
[im 6/146  soft-tissue]
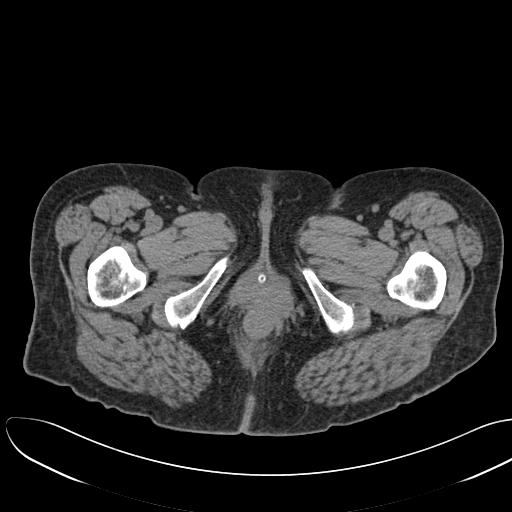
[im 6/146  bone]
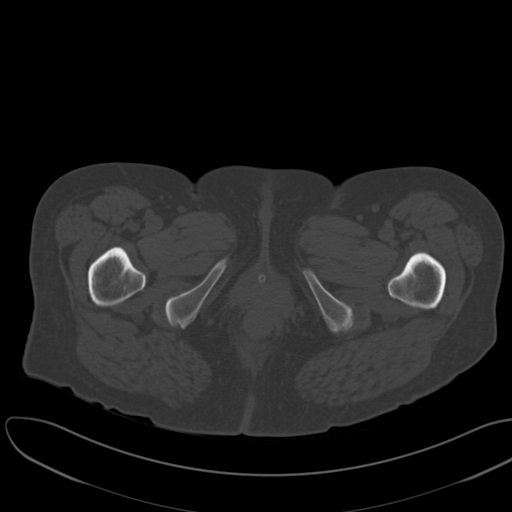
[im 17/146  soft-tissue]
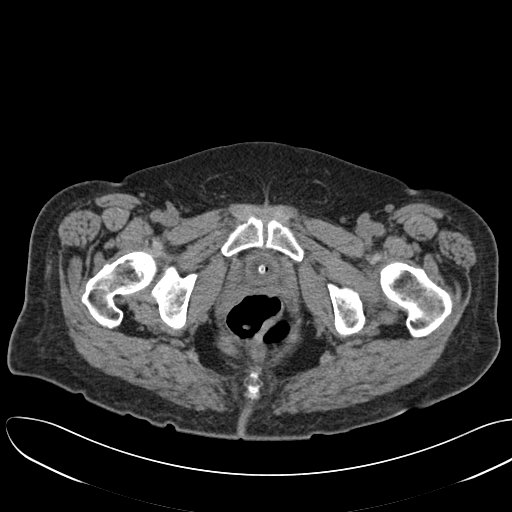
[im 28/146  soft-tissue]
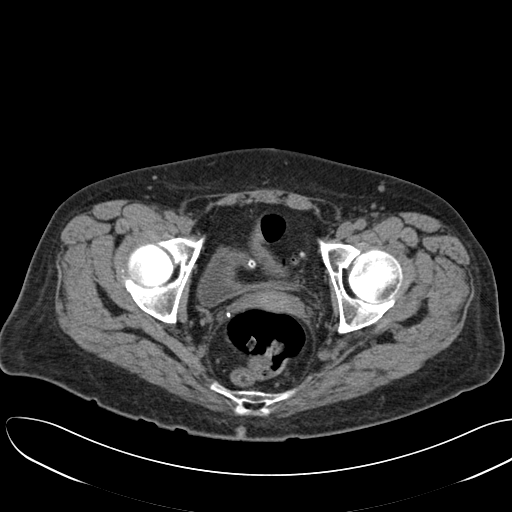
[im 40/146  soft-tissue]
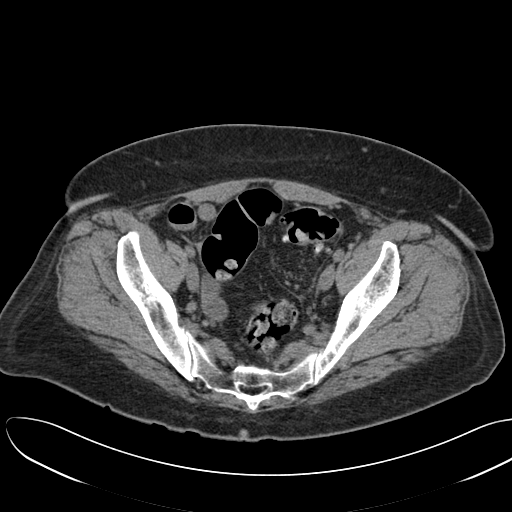
[im 51/146  soft-tissue]
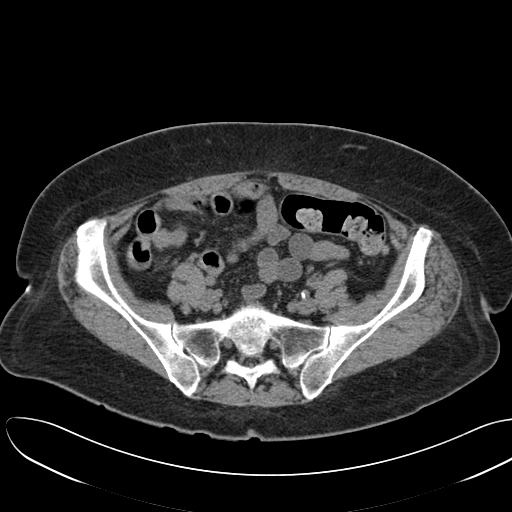
[im 62/146  soft-tissue]
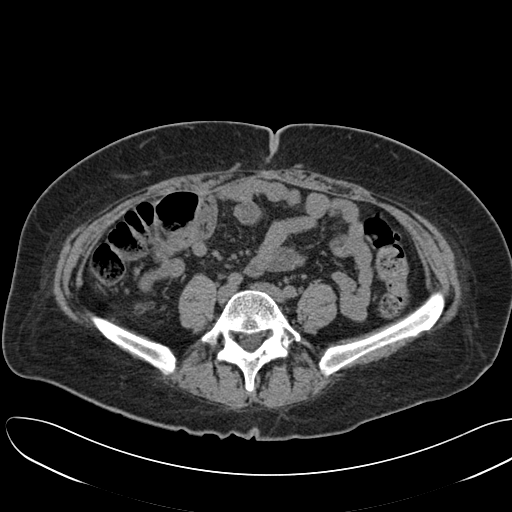
[im 73/146  soft-tissue]
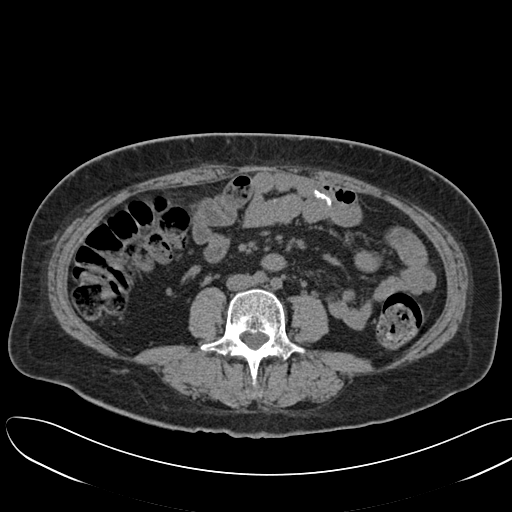
[im 84/146  soft-tissue]
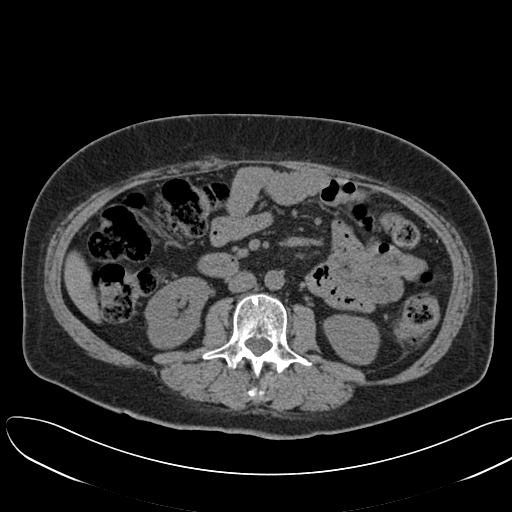
[im 95/146  soft-tissue]
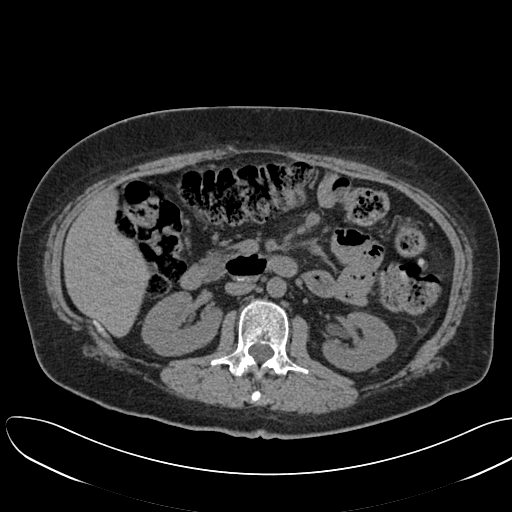
[im 95/146  bone]
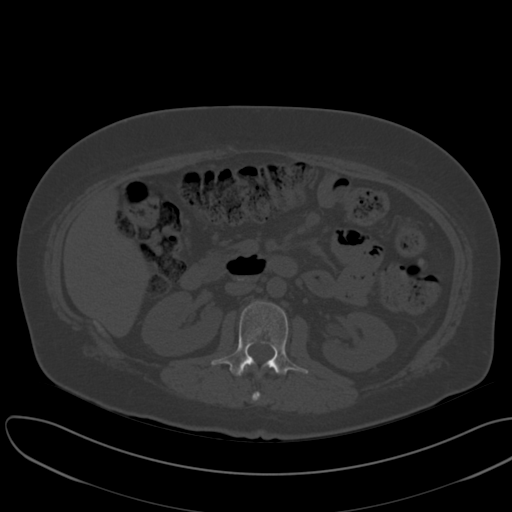
[im 106/146  soft-tissue]
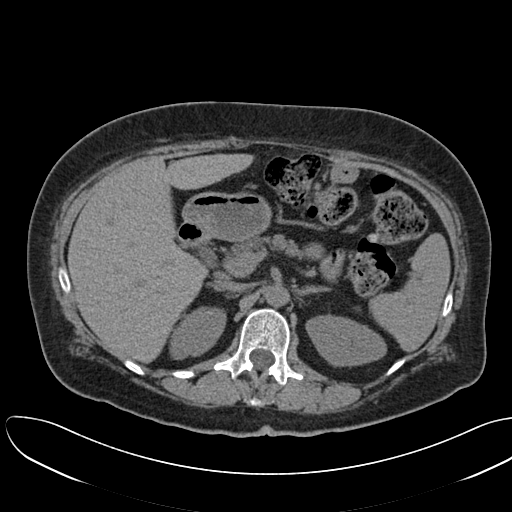
[im 118/146  soft-tissue]
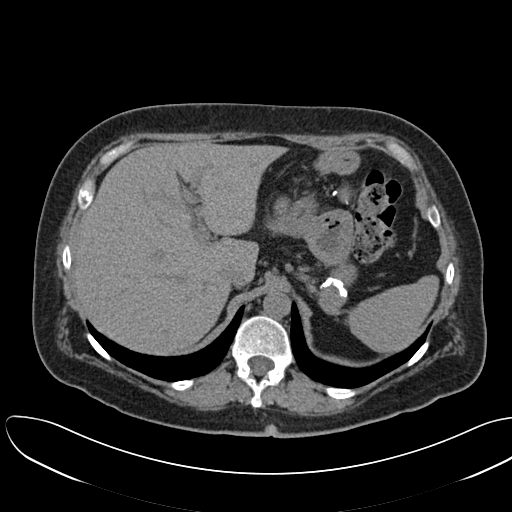
[im 123/146  lung]
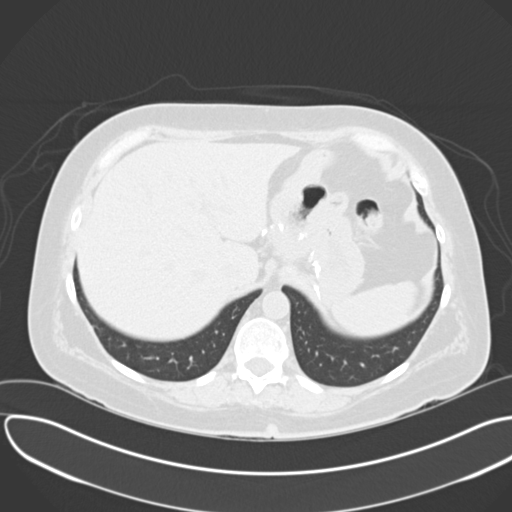
[im 129/146  soft-tissue]
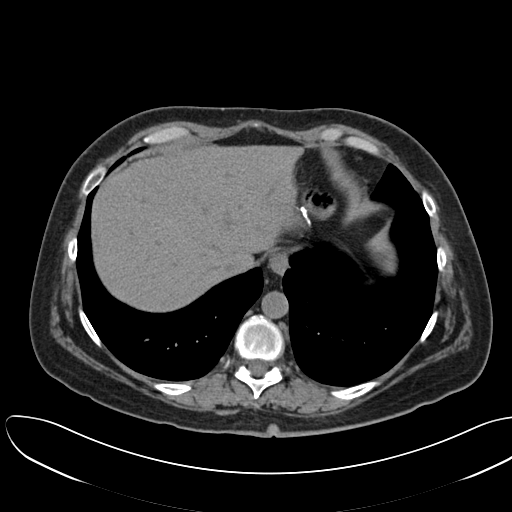
[im 129/146  lung]
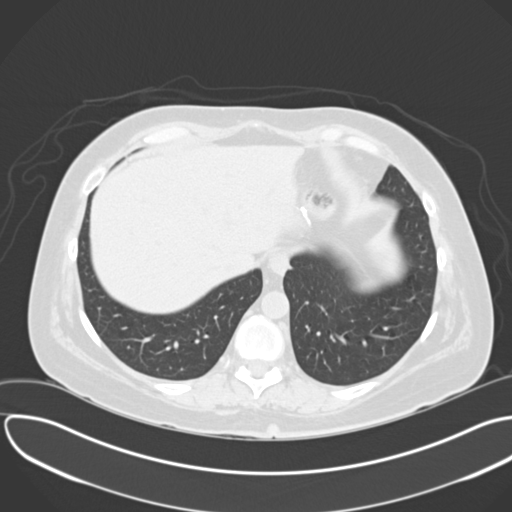
[im 134/146  lung]
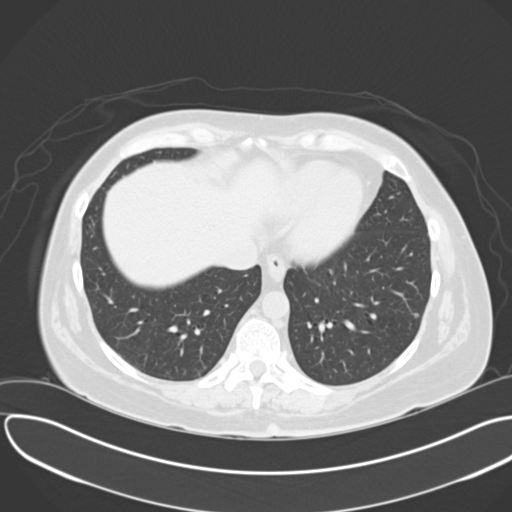
[im 140/146  soft-tissue]
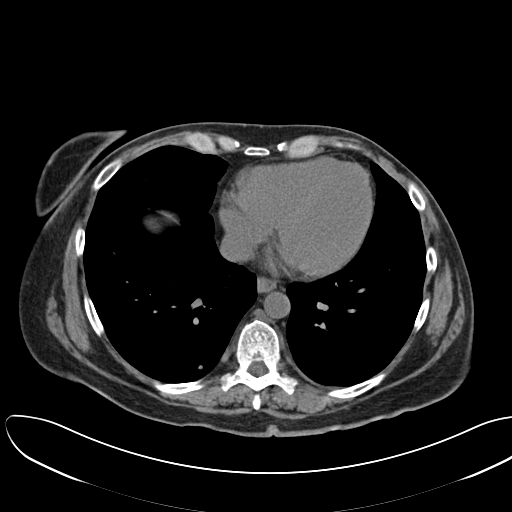
[im 140/146  lung]
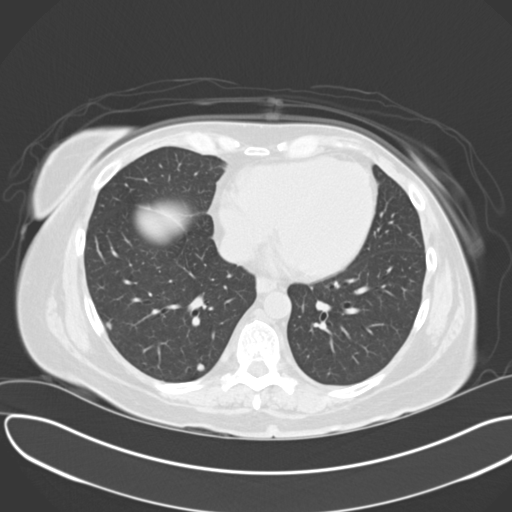

[15 of 32 positions shown; findings below may reference images not displayed]

PROCEDURE:     CT  - CT ABDOMEN AND PELVIS W[DATE]  [DATE]

RESULT:     Renal stone protocol emergent CT of the abdomen and pelvis is
performed and reconstructed in the axial plane at 3 mm slice thickness. The
patient has no previous examination for comparison.

The urinary bladder is nondistended. A Foley catheter is present. There is
extensive colonic diverticulosis. There appears to be some minimal
peridiverticular inflammatory stranding in the mid descending colon
posteriorly best seen on images #59 through #62. There is no abscess or free
air. There is no free fluid. The urinary system shows no obstruction. No
urinary calculi are present. The appendix is not evident. There is no
abnormal bowel distention. Surgical changes are seen in the anterior mid
abdomen. Gastric surgical changes are present. The upper abdominal viscera
appear to be within normal limits for a noncontrast exam. The lungs
demonstrate a tiny nodule on image
#8 in the LEFT lower lobe at lung window settings only which is nonspecific
measuring 4 mm. Follow-up in three months with a Chest CT would be
recommended. There appears to be some linear fibrosis in the LEFT
costophrenic angle. Minimal nodular density is seen in the paraspinal RIGHT
lower lobe on image #4 at lung window settings measuring approximately 4 mm.
Subpleural RIGHT lower lobe soft tissue nodular density is seen on image #4
more laterally measuring approximately 4 mm.
IMPRESSION: 1. Descending colon mild diverticulosis with diverticulitis. There is no
abscess or perforation. There is no urinary obstruction or evidence of a
urinary tract stone.
2. Multiple tiny pulmonary nodules for which chest CT follow-up is
recommended. Is there a history of underlying malignancy?

## 2008-10-20 ENCOUNTER — Ambulatory Visit: Payer: Self-pay | Admitting: Unknown Physician Specialty

## 2008-10-20 LAB — HM COLONOSCOPY

## 2008-12-15 ENCOUNTER — Ambulatory Visit: Payer: Self-pay | Admitting: Unknown Physician Specialty

## 2008-12-22 ENCOUNTER — Ambulatory Visit: Payer: Self-pay | Admitting: Unknown Physician Specialty

## 2008-12-23 ENCOUNTER — Emergency Department: Payer: Self-pay | Admitting: Unknown Physician Specialty

## 2008-12-23 IMAGING — CT CT CHEST-ABD-PELV W/ CM
1 of 2 series · 14 of 32 positions shown, 18 images · non-contrast
Comparison: none

REASON FOR EXAM: (1) pulmonary nodules on ct in [DATE]) abd pain
COMMENTS:

[Series 2: soft tissue · axial · 0.72mm/px · z∈[-474,+70]mm · 14 of 121 slices shown, 18 images]
[im 6/121  soft-tissue]
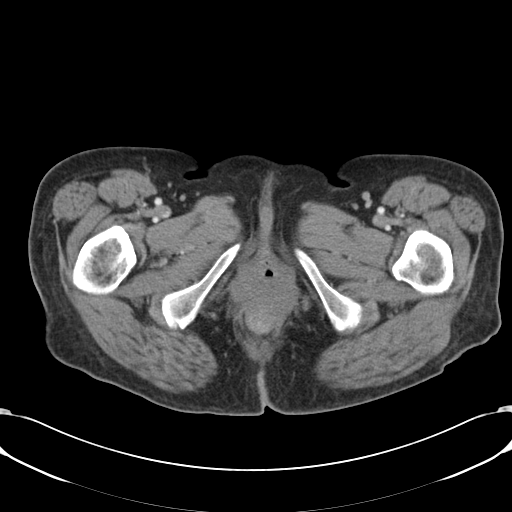
[im 6/121  bone]
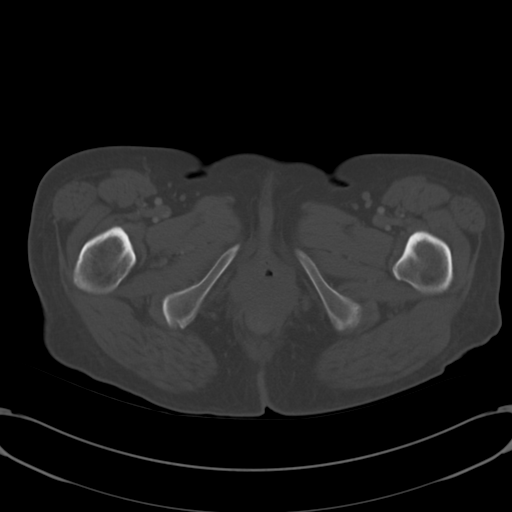
[im 18/121  soft-tissue]
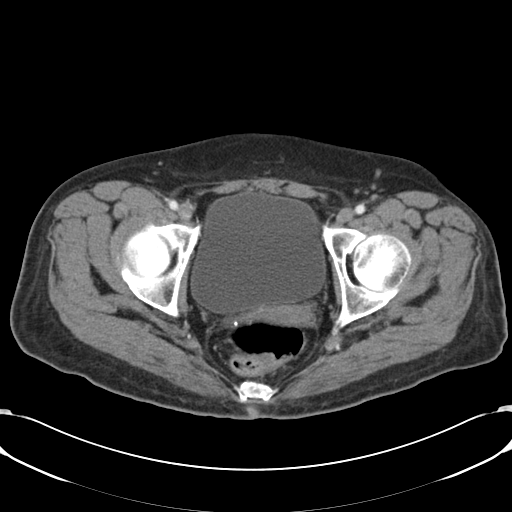
[im 29/121  soft-tissue]
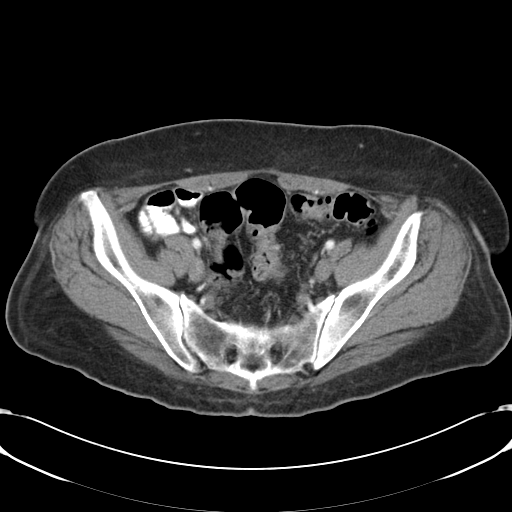
[im 35/121  soft-tissue]
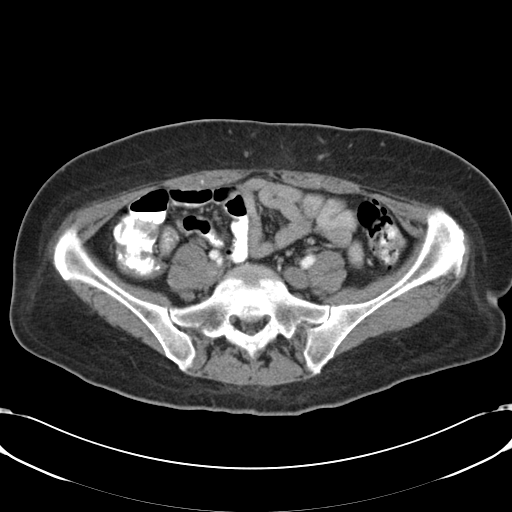
[im 46/121  soft-tissue]
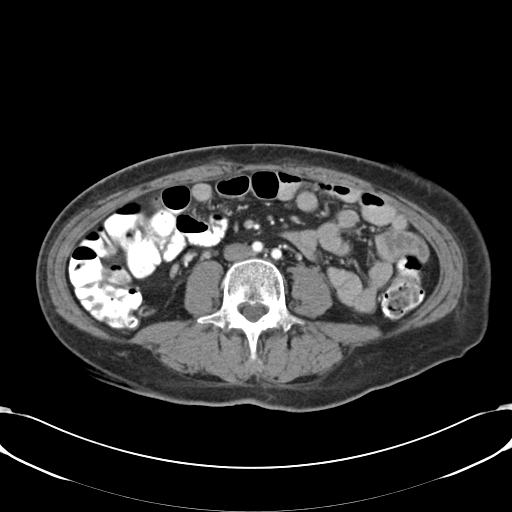
[im 58/121  soft-tissue]
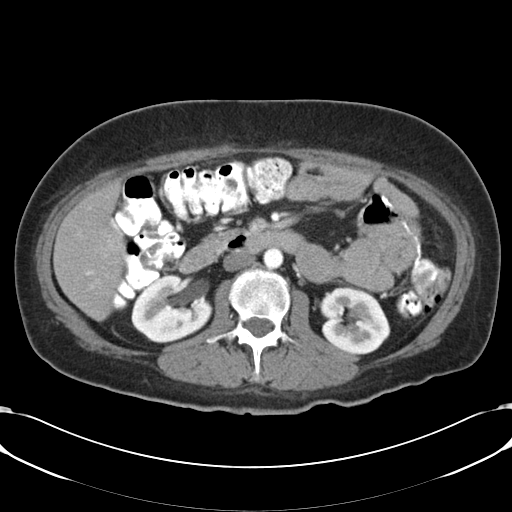
[im 63/121  soft-tissue]
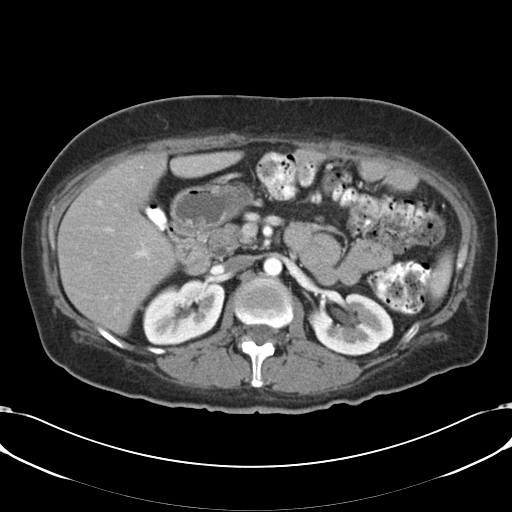
[im 75/121  soft-tissue]
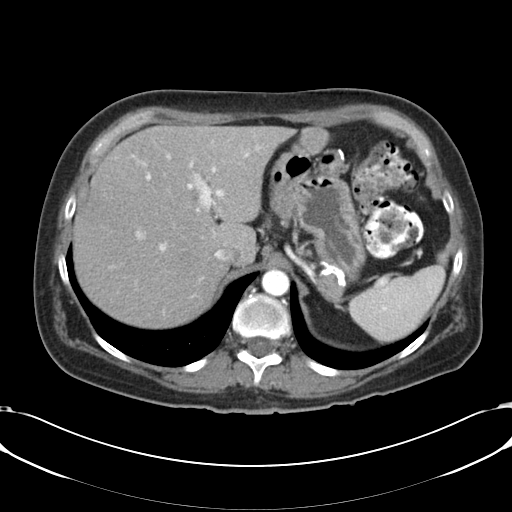
[im 86/121  soft-tissue]
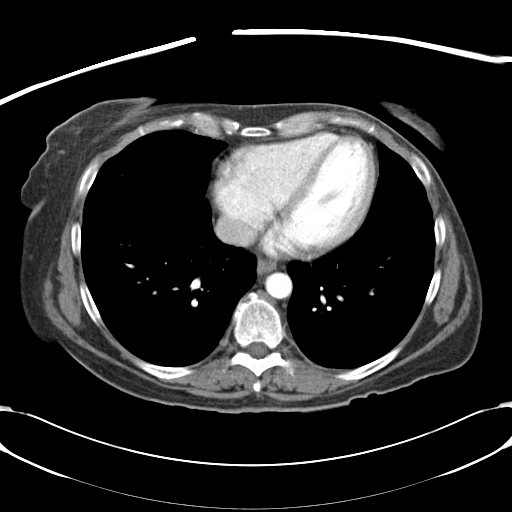
[im 86/121  bone]
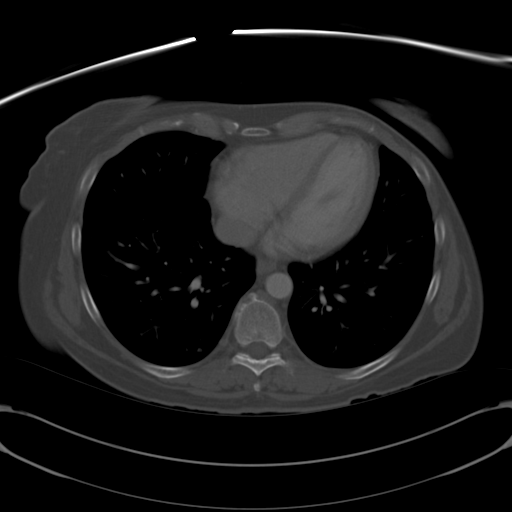
[im 92/121  soft-tissue]
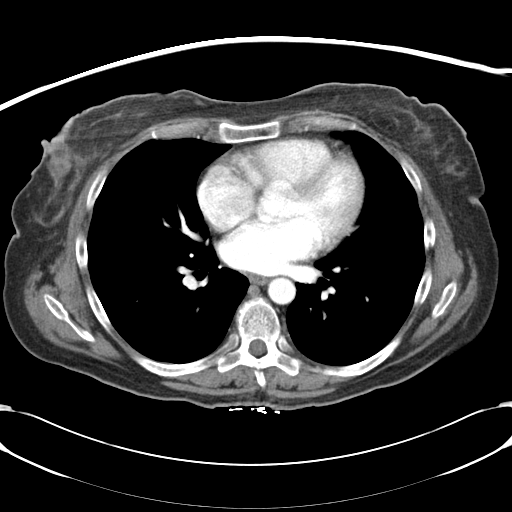
[im 98/121  lung]
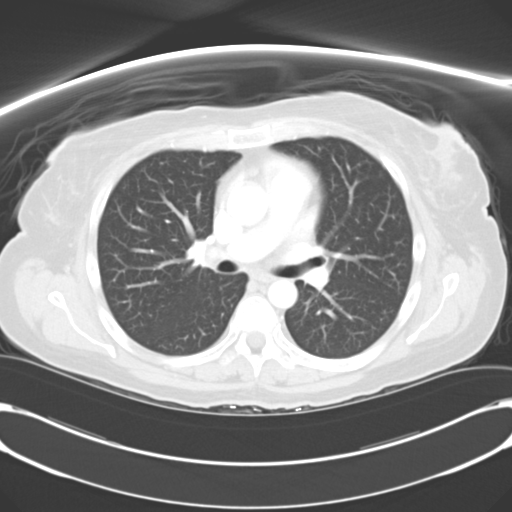
[im 103/121  soft-tissue]
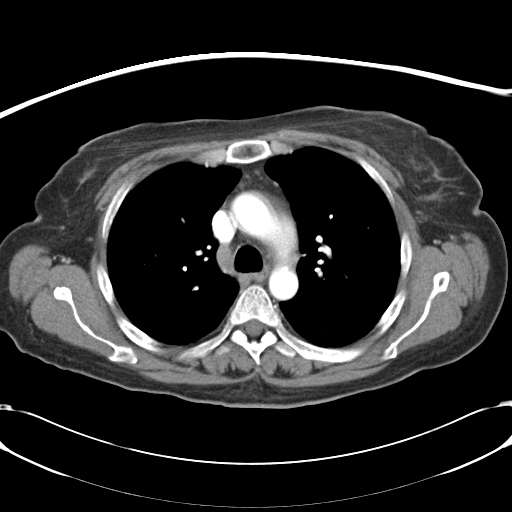
[im 103/121  lung]
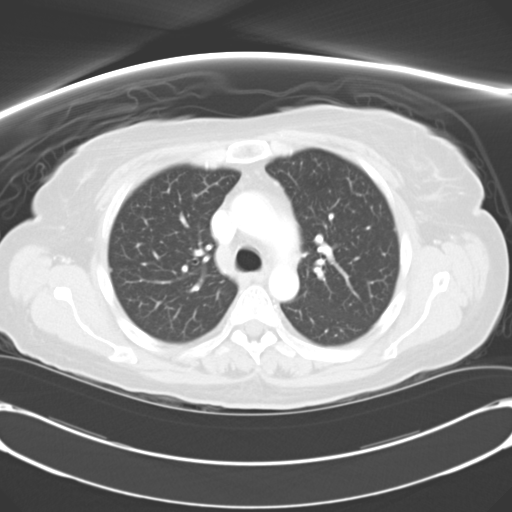
[im 109/121  lung]
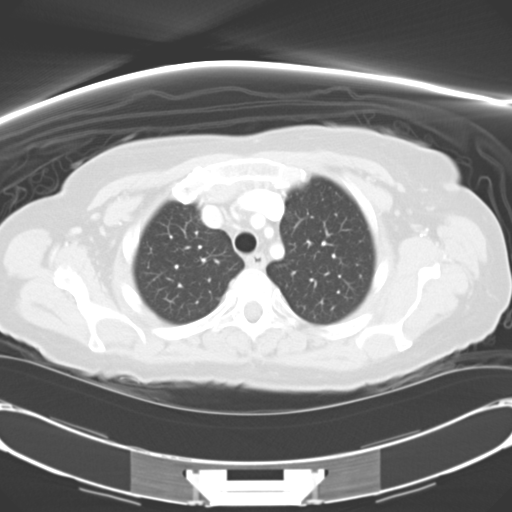
[im 115/121  soft-tissue]
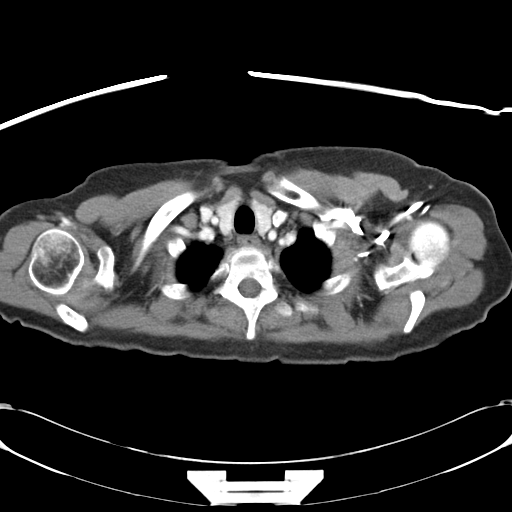
[im 115/121  lung]
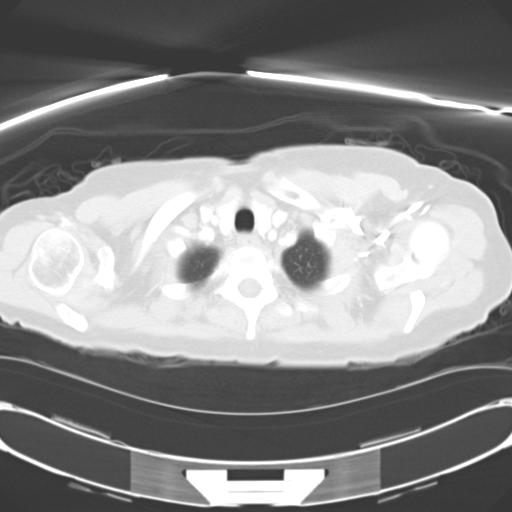

[14 of 32 positions shown; findings below may reference images not displayed]

PROCEDURE:     CT  - CT CHEST ABDOMEN AND PELVIS W  - [DATE]  [DATE]

RESULT:     Axial CT scanning was performed through the chest, abdomen, and
pelvis at 5 mm intervals and slice thicknesses. Review of 3-dimensional
reconstructed images was performed separately on the [employer] monitor.

CT scan of the chest: At lung window settings I see no interstitial nor
alveolar infiltrates. No pulmonary parenchymal masses are identified. On
image 32 in the right lower lobe there is a tiny nodule measuring
approximately 3 mm in diameter. On image 36 there is an additional
approximately 3 mm diameter nodule. These are stable from the study [DATE]. A tiny subpleural nodule on image 19 measuring 2 mm in
diameter was not included in the field-of-view on the prior abdominal CT
scan. It lies laterally in the right upper lobe in a subpleural location. On
the left a tiny nodule measuring no more than 2 mm in diameter seen on image
19 and a subpleural location in the upper lobe. I do not see definite
nodules elsewhere in the left lung.

At mediastinal window settings the cardiac chambers are top normal in size.
No pathologic sized mediastinal or hilar lymph nodes are demonstrated. The
caliber of the thoracic aorta is normal. The thoracic vertebral bodies are
preserved in height.
CONCLUSION: There are a few subcentimeter nodules in both lungs. The ones
visible on the prior study have not recently changed.
2. I see no mediastinal or hilar lymphadenopathy nor other acute abnormality
within the thorax.

CT scan of the abdomen and pelvis colon on the prior study from [DATE] the patient was noted to have descending colonic diverticulitis. This
area has normalized in appearance. There is a moderate amount of stool in
the left colon that may indicate an element of constipation. The sigmoid
colon exhibits no finding to suggest acute diverticulitis. The right colon
as well as the partially contrast-filled loops of small bowel are normal in
appearance.

The liver, spleen, kidneys, adrenal glands, and pancreas are normal in
appearance. There are surgical sutures noted adjacent to the stomach in the
upper abdomen. The gallbladder is surgically absent. The caliber of the
abdominal aorta is normal. I do not see evidence of ascites. No inguinal nor
umbilical hernia is demonstrated. The psoas musculature is normal in density
and contour. The partially distended urinary bladder exhibits no acute
abnormality. The lumbar vertebral bodies are preserved in height.
IMPRESSION: 1. I do not see objective evidence of acute diverticulitis or other forms of
colitis nor for bowel obstruction. There may be an element of constipation
present.
2. I do not see evidence of acute hepatobiliary abnormality nor acute
urinary tract abnormality.
3. Please see the discussion above regarding findings in the thorax.

## 2009-11-01 LAB — HM PAP SMEAR

## 2009-11-06 ENCOUNTER — Emergency Department: Payer: Self-pay | Admitting: Unknown Physician Specialty

## 2010-03-13 ENCOUNTER — Ambulatory Visit: Payer: Self-pay | Admitting: Internal Medicine

## 2010-03-30 ENCOUNTER — Ambulatory Visit: Payer: Self-pay | Admitting: Internal Medicine

## 2010-04-19 ENCOUNTER — Ambulatory Visit: Payer: Self-pay | Admitting: Internal Medicine

## 2010-04-29 ENCOUNTER — Ambulatory Visit: Payer: Self-pay | Admitting: Internal Medicine

## 2010-05-16 IMAGING — US ABDOMEN ULTRASOUND
1 series · 17 of 25 positions shown · non-contrast
Comparison: none

REASON FOR EXAM: leukopenia anemia eval for hepatosplenomegaly or
cirrhosis
COMMENTS:

[Series 1: abdomen ultrasound · 17 of 72 slices shown]
[im 1/72]
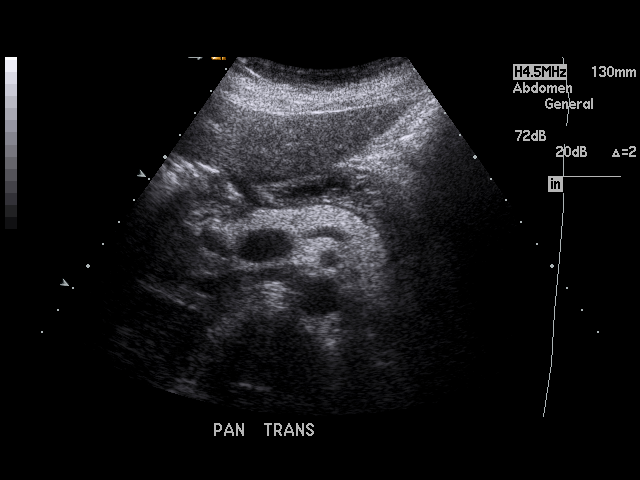
[im 6/72]
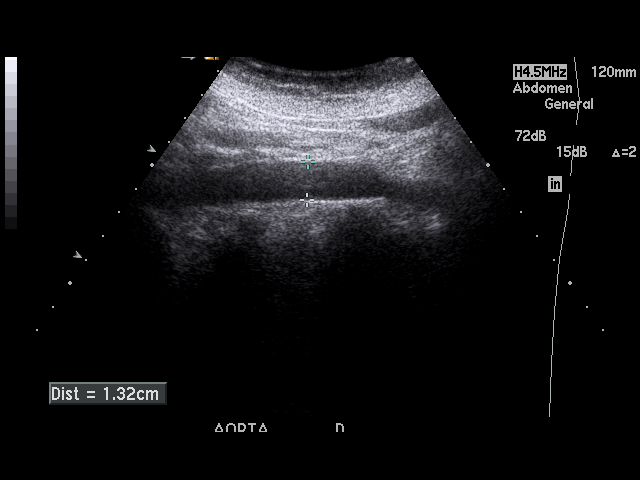
[im 9/72]
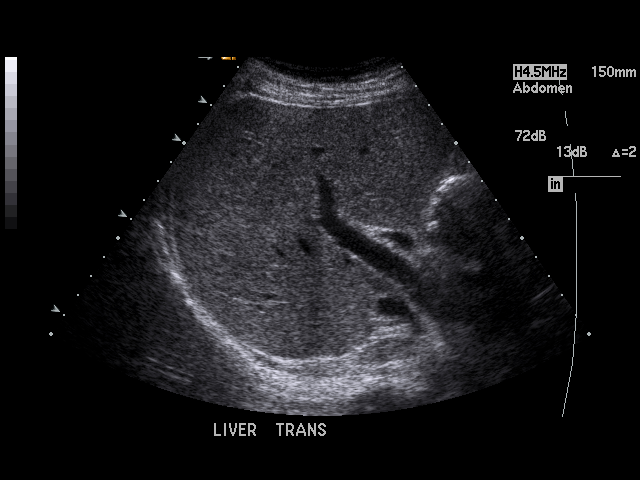
[im 15/72]
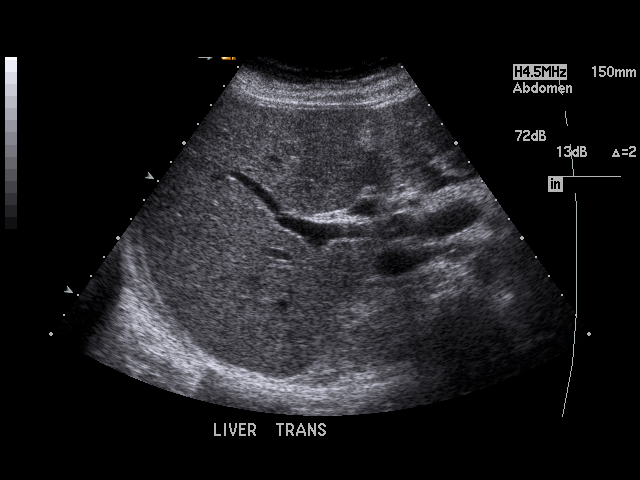
[im 18/72]
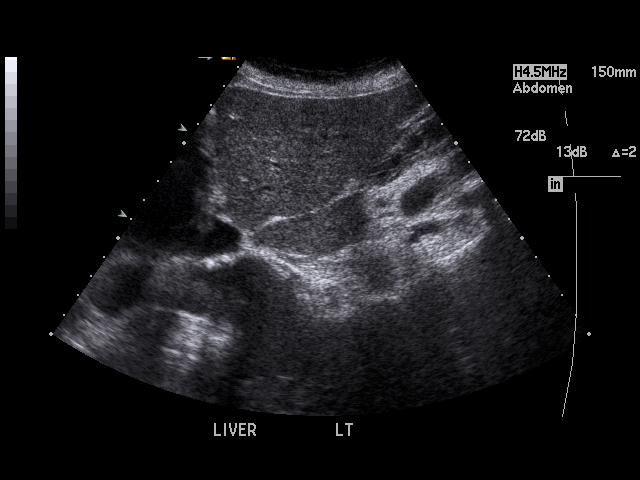
[im 24/72]
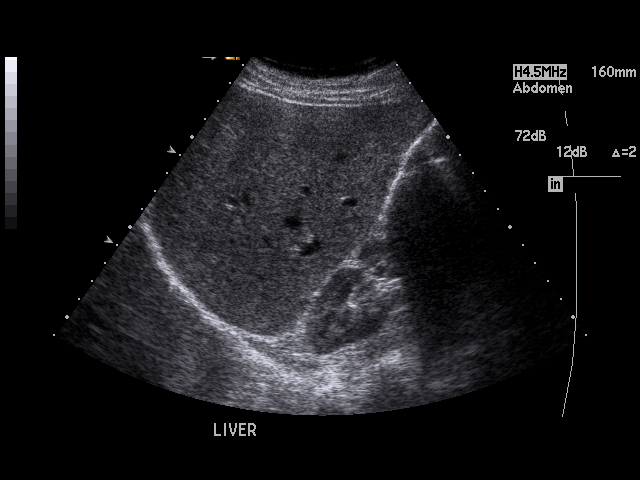
[im 27/72]
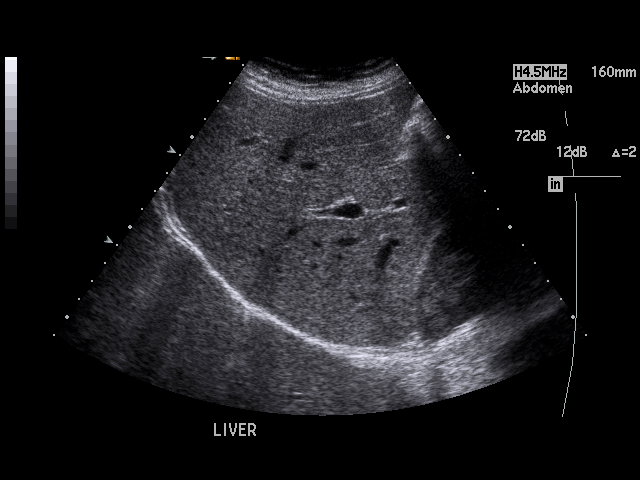
[im 33/72]
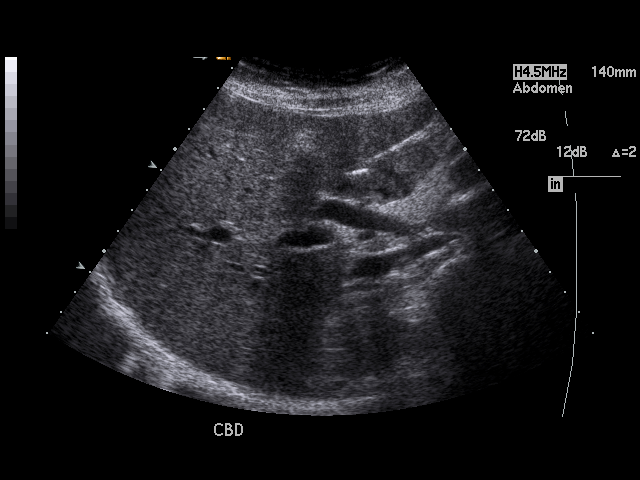
[im 36/72]
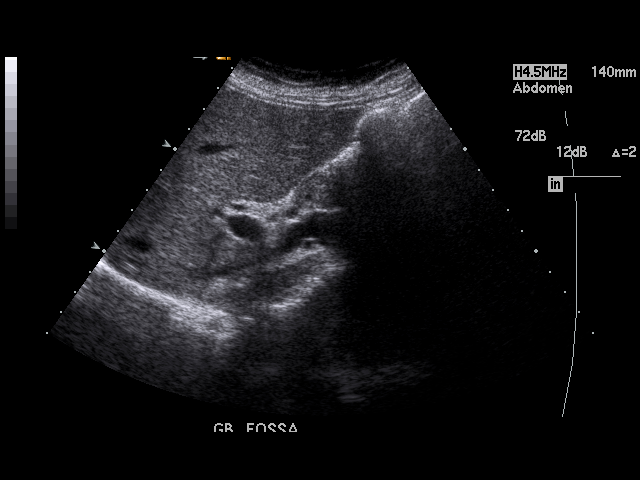
[im 39/72]
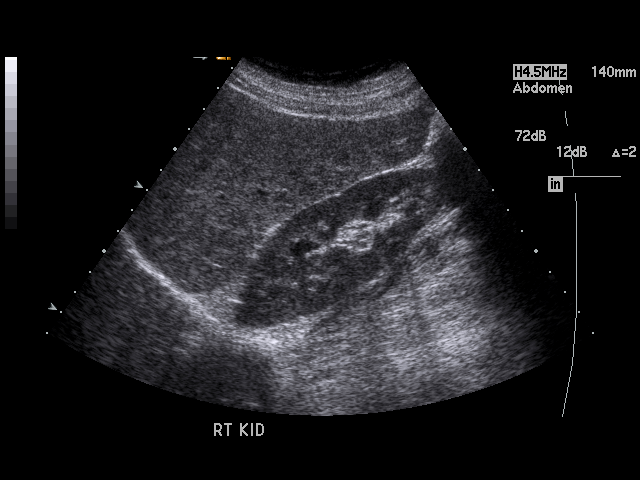
[im 45/72]
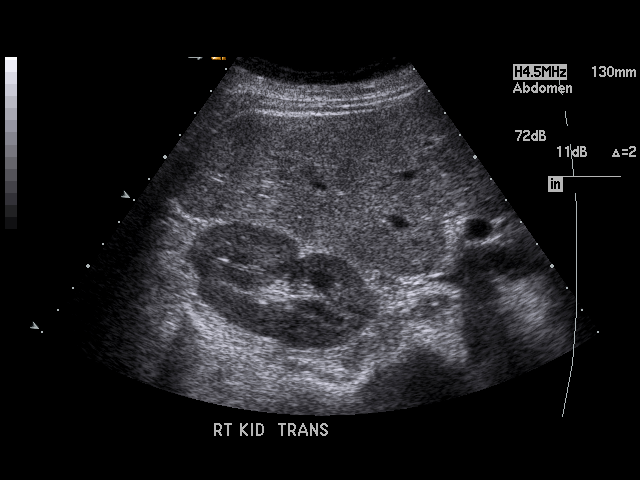
[im 48/72]
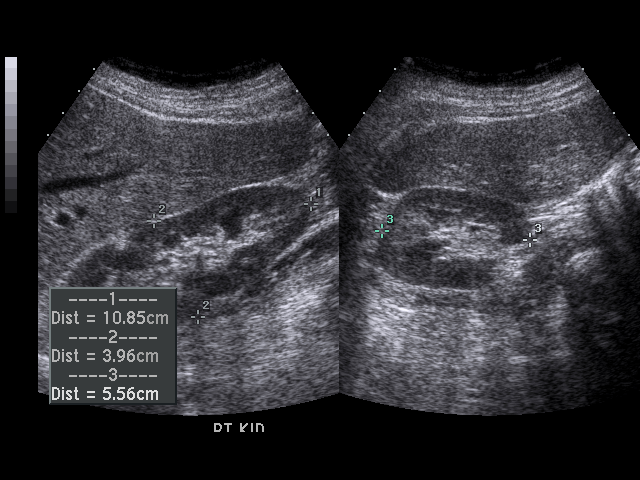
[im 54/72]
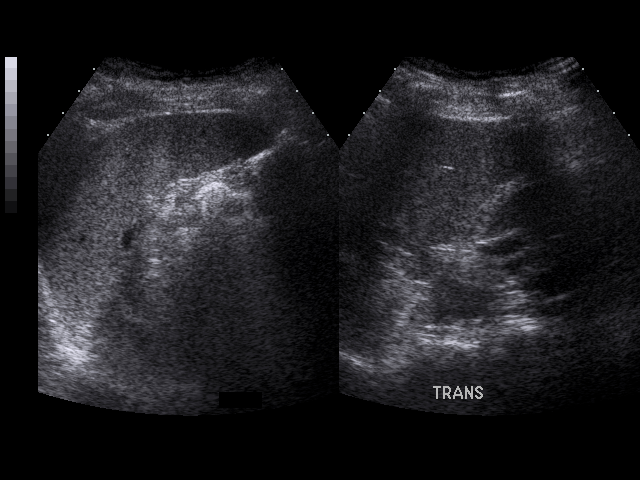
[im 57/72]
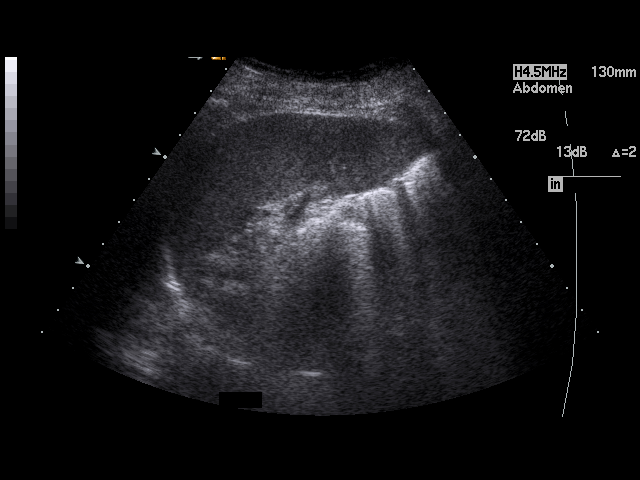
[im 63/72]
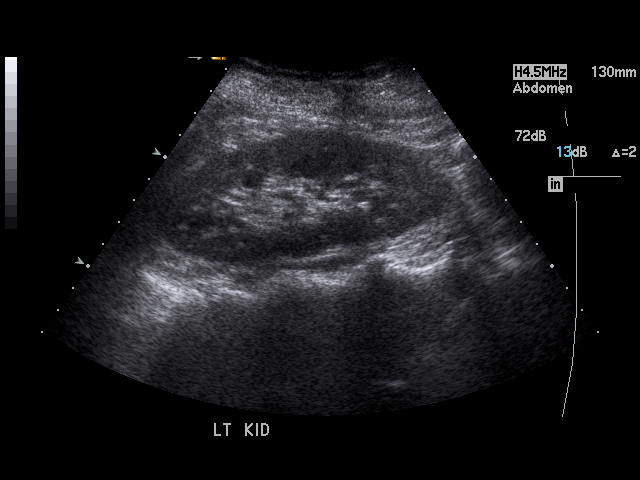
[im 66/72]
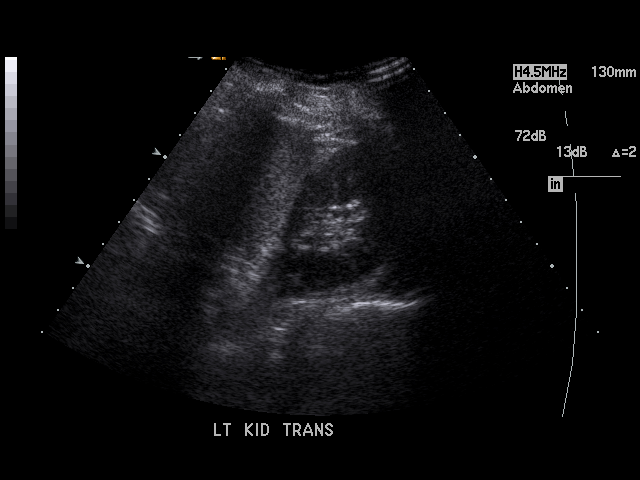
[im 72/72]
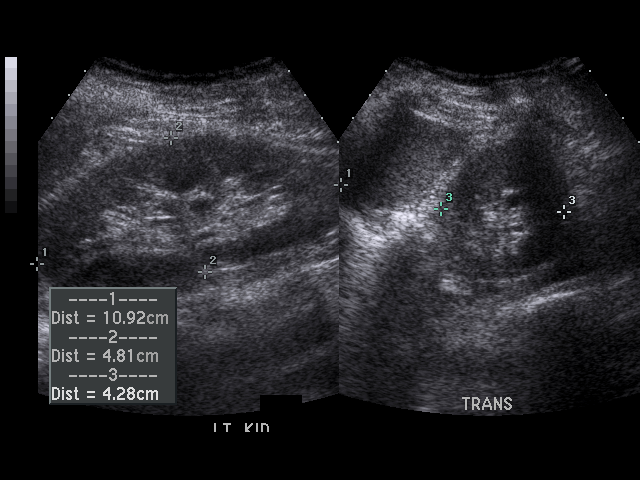

[17 of 25 positions shown; findings below may reference images not displayed]

PROCEDURE:     US  - US ABDOMEN GENERAL SURVEY  - [DATE] [DATE]

RESULT:     The liver, spleen, abdominal aorta and inferior vena cava show
no significant abnormalities. A portion of the pancreatic tail is obscured
by bowel gas but otherwise the pancreas is normal in appearance. The
gallbladder is not seen compatible with prior cholecystectomy. The common
bile duct measures 8.4 mm in diameter which is prominent but within the
limits of normal for the post cholecystectomy patient. The kidneys show no
hydronephrosis. The right kidney measures 10.85 cm sagittally and the left
kidney measures 10.92 cm. No ascites is seen.
IMPRESSION: 1. The patient is status post cholecystectomy.
2. No acute changes are identified.

## 2010-05-30 ENCOUNTER — Ambulatory Visit: Payer: Self-pay | Admitting: Internal Medicine

## 2010-06-29 ENCOUNTER — Ambulatory Visit: Payer: Self-pay | Admitting: Internal Medicine

## 2010-07-30 HISTORY — PX: OTHER SURGICAL HISTORY: SHX169

## 2011-03-07 ENCOUNTER — Ambulatory Visit: Payer: Self-pay | Admitting: Internal Medicine

## 2011-03-31 ENCOUNTER — Ambulatory Visit: Payer: Self-pay | Admitting: Internal Medicine

## 2011-07-31 HISTORY — PX: HEMORRHOID SURGERY: SHX153

## 2011-09-21 ENCOUNTER — Ambulatory Visit: Payer: Self-pay | Admitting: Internal Medicine

## 2011-09-28 ENCOUNTER — Ambulatory Visit: Payer: Self-pay | Admitting: Internal Medicine

## 2011-12-12 ENCOUNTER — Ambulatory Visit: Payer: Self-pay | Admitting: Surgery

## 2012-01-09 ENCOUNTER — Observation Stay: Payer: Self-pay | Admitting: Internal Medicine

## 2012-01-09 LAB — URINALYSIS, COMPLETE
Bacteria: NONE SEEN
Bilirubin,UR: NEGATIVE
Blood: NEGATIVE
Ketone: NEGATIVE
Leukocyte Esterase: NEGATIVE
Nitrite: NEGATIVE
Ph: 8 (ref 4.5–8.0)
RBC,UR: NONE SEEN /HPF (ref 0–5)
Specific Gravity: 1.003 (ref 1.003–1.030)
Squamous Epithelial: <1
WBC UR: NONE SEEN /HPF (ref 0–5)

## 2012-01-09 LAB — BASIC METABOLIC PANEL
Calcium, Total: 8.7 mg/dL (ref 8.5–10.1)
EGFR (African American): >60
Glucose: 259 mg/dL — ABNORMAL HIGH (ref 65–99)
Osmolality: 269 (ref 275–301)
Potassium: 4.1 mmol/L (ref 3.5–5.1)
Sodium: 131 mmol/L — ABNORMAL LOW (ref 136–145)

## 2012-01-09 LAB — CBC
HCT: 37.5 % (ref 35.0–47.0)
MCH: 29.2 pg (ref 26.0–34.0)
MCHC: 32.4 g/dL (ref 32.0–36.0)
Platelet: 218 10*3/uL (ref 150–440)
RBC: 4.17 10*6/uL (ref 3.80–5.20)
RDW: 12.9 % (ref 11.5–14.5)
WBC: 4.2 10*3/uL (ref 3.6–11.0)

## 2012-01-09 LAB — CK TOTAL AND CKMB (NOT AT ARMC)
CK, Total: 48 U/L (ref 21–215)
CK-MB: <0.5 ng/mL — ABNORMAL LOW (ref 0.5–3.6)

## 2012-01-09 LAB — TROPONIN I: Troponin-I: <0.02 ng/mL

## 2012-01-09 IMAGING — CR DG CHEST 2V
1 series · 2 of 2 positions shown · non-contrast
Comparison: none

REASON FOR EXAM: cp
COMMENTS:

[Series 1: w chest pa · 0.14mm/px · 2 of 2 slices shown]
[im 1/2]
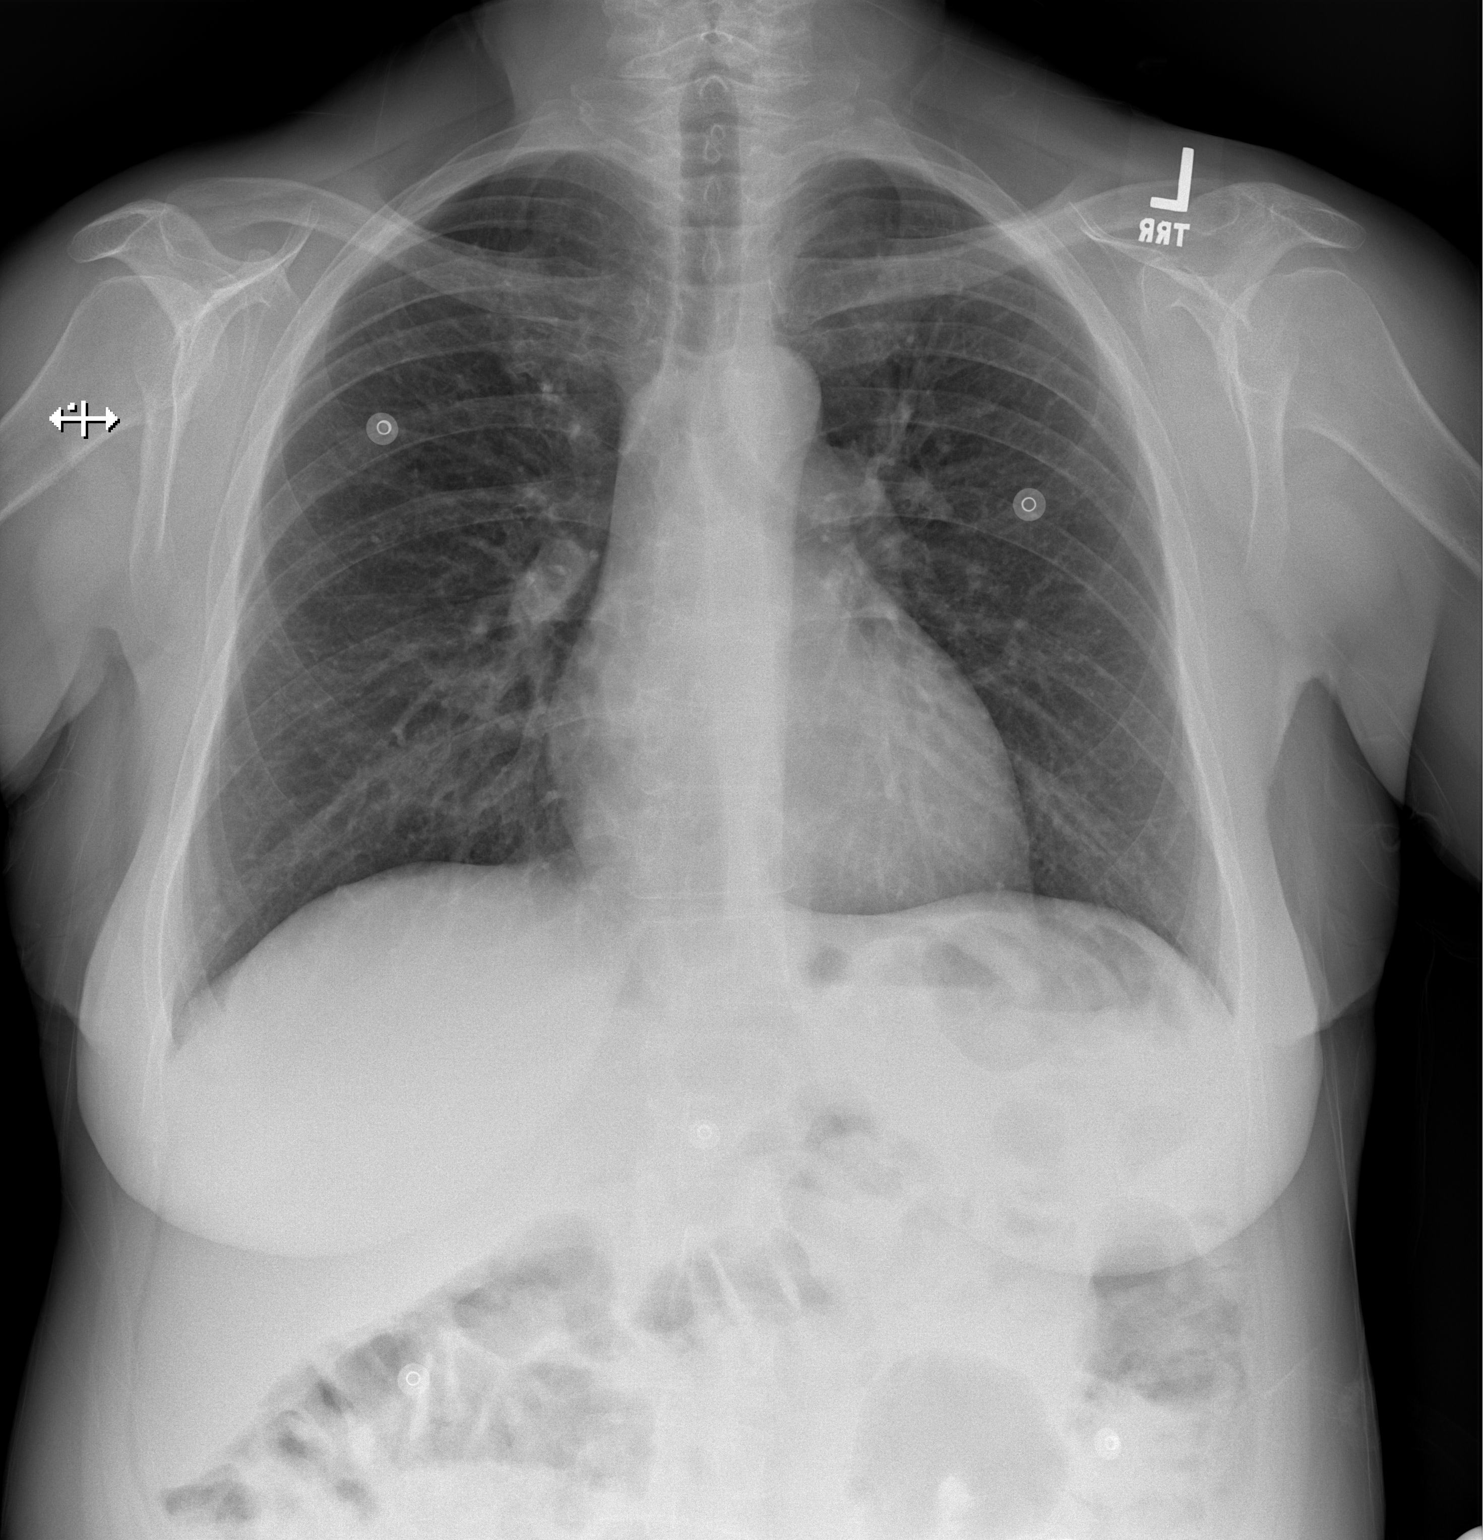
[im 2/2]
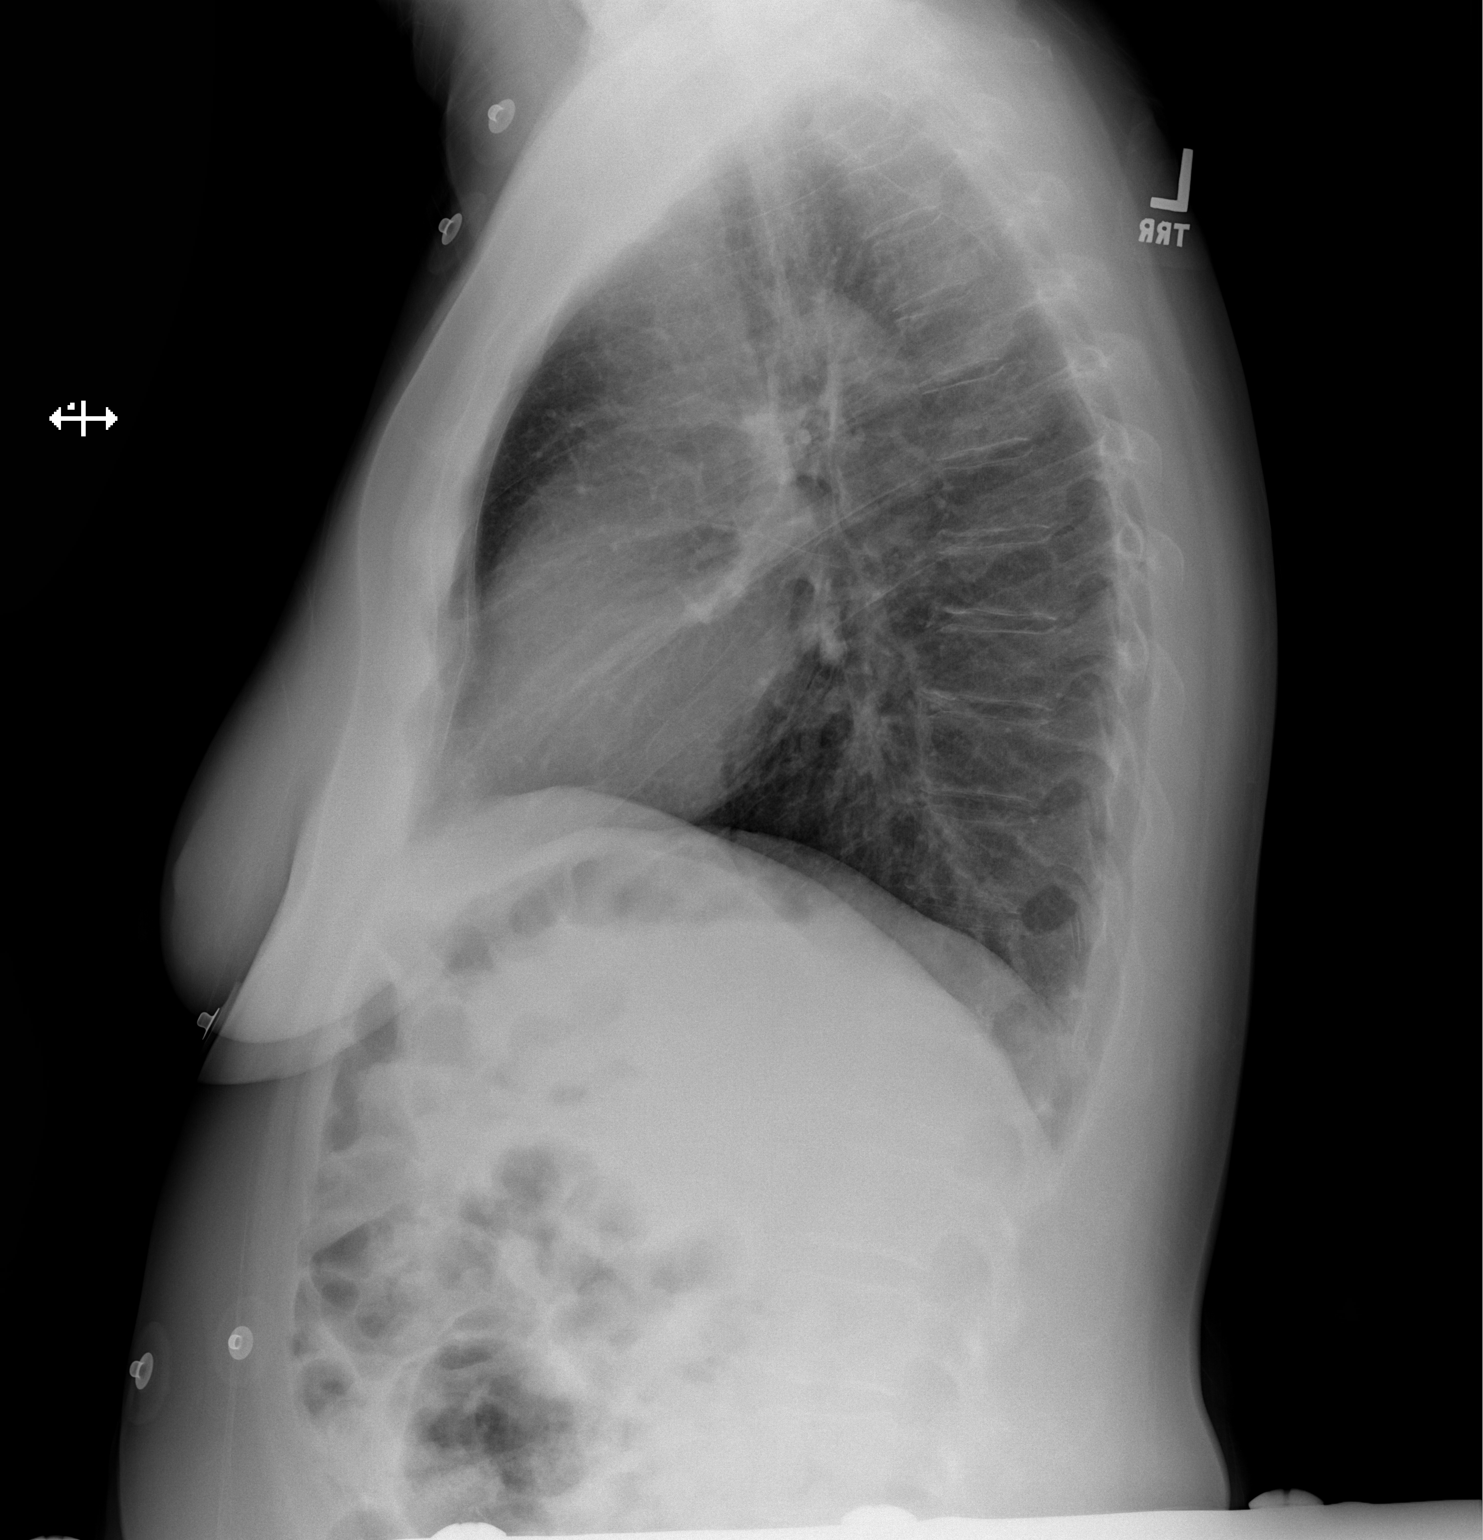

[2 of 2 positions shown; findings below may reference images not displayed]

PROCEDURE:     DXR - DXR CHEST PA (OR AP) AND LATERAL  - [DATE]  [DATE]

RESULT:     There is no previous exam for comparison.

There is some ill-defined increased density superimposed over the anterior
right first rib which could represent some prominent calcification. Lordotic
projection is recommended to exclude underlying mass. The lungs are
otherwise clear. The cardiac silhouette and pulmonary vasculature are
normal. The bony mediastinal structures otherwise within normal limits.
IMPRESSION: 1. Followup lordotic view is recommended to evaluate the right lung apex.
Please see above.

[REDACTED]

## 2012-01-10 LAB — CBC WITH DIFFERENTIAL/PLATELET
Basophil #: 0 10*3/uL (ref 0.0–0.1)
Eosinophil %: 0.7 %
HCT: 37.4 % (ref 35.0–47.0)
HGB: 12.3 g/dL (ref 12.0–16.0)
Lymphocyte #: 1.4 10*3/uL (ref 1.0–3.6)
Lymphocyte %: 45.3 %
MCHC: 32.9 g/dL (ref 32.0–36.0)
Monocyte #: 0.3 x10 3/mm (ref 0.2–0.9)
Neutrophil #: 1.4 10*3/uL (ref 1.4–6.5)
Platelet: 225 10*3/uL (ref 150–440)
RBC: 4.21 10*6/uL (ref 3.80–5.20)
RDW: 12.9 % (ref 11.5–14.5)
WBC: 3 10*3/uL — ABNORMAL LOW (ref 3.6–11.0)

## 2012-01-10 LAB — BASIC METABOLIC PANEL
Anion Gap: 7 (ref 7–16)
Calcium, Total: 8.8 mg/dL (ref 8.5–10.1)
Chloride: 105 mmol/L (ref 98–107)
Co2: 28 mmol/L (ref 21–32)
Creatinine: 0.58 mg/dL — ABNORMAL LOW (ref 0.60–1.30)

## 2012-01-10 LAB — LIPID PANEL
Triglycerides: 110 mg/dL (ref 0–200)
VLDL Cholesterol, Calc: 22 mg/dL (ref 5–40)

## 2012-01-10 LAB — CK TOTAL AND CKMB (NOT AT ARMC): CK-MB: <0.5 ng/mL — ABNORMAL LOW (ref 0.5–3.6)

## 2012-08-30 ENCOUNTER — Ambulatory Visit: Payer: Self-pay | Admitting: Internal Medicine

## 2012-09-27 ENCOUNTER — Ambulatory Visit: Payer: Self-pay | Admitting: Internal Medicine

## 2012-12-24 ENCOUNTER — Ambulatory Visit: Payer: Self-pay

## 2012-12-24 LAB — HM MAMMOGRAPHY

## 2012-12-24 IMAGING — MG MM CAD SCREENING MAMMO
1 series · 5 of 5 positions shown · non-contrast
Comparison: none

REASON FOR EXAM: SCR MAMMO NO ORDER
COMMENTS:

[R CC · right · 5 of 5 slices shown]
[im 1/5]
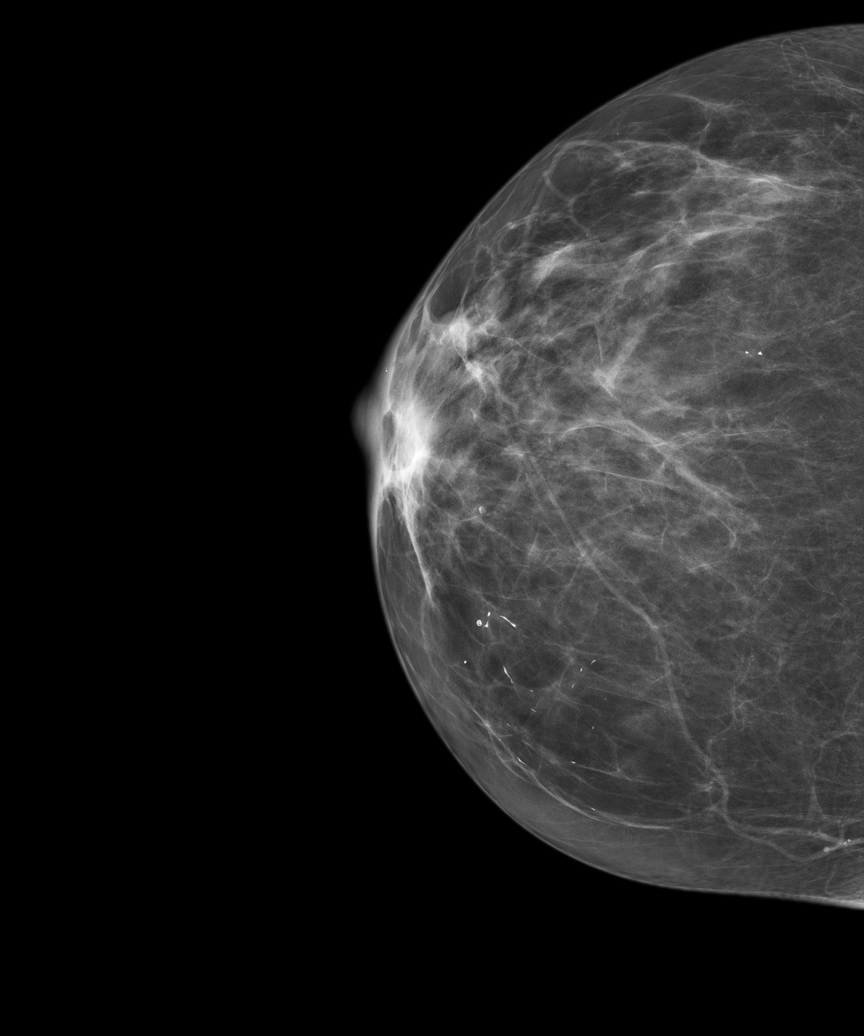
[im 2/5]
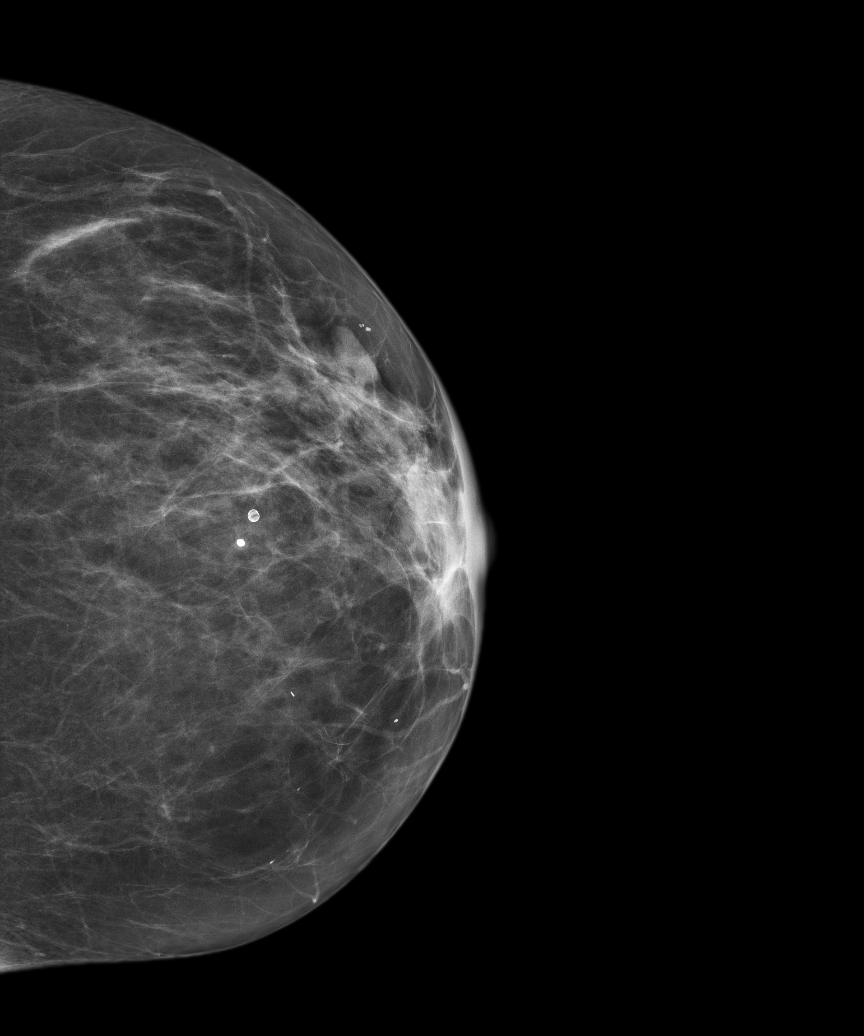
[im 3/5]
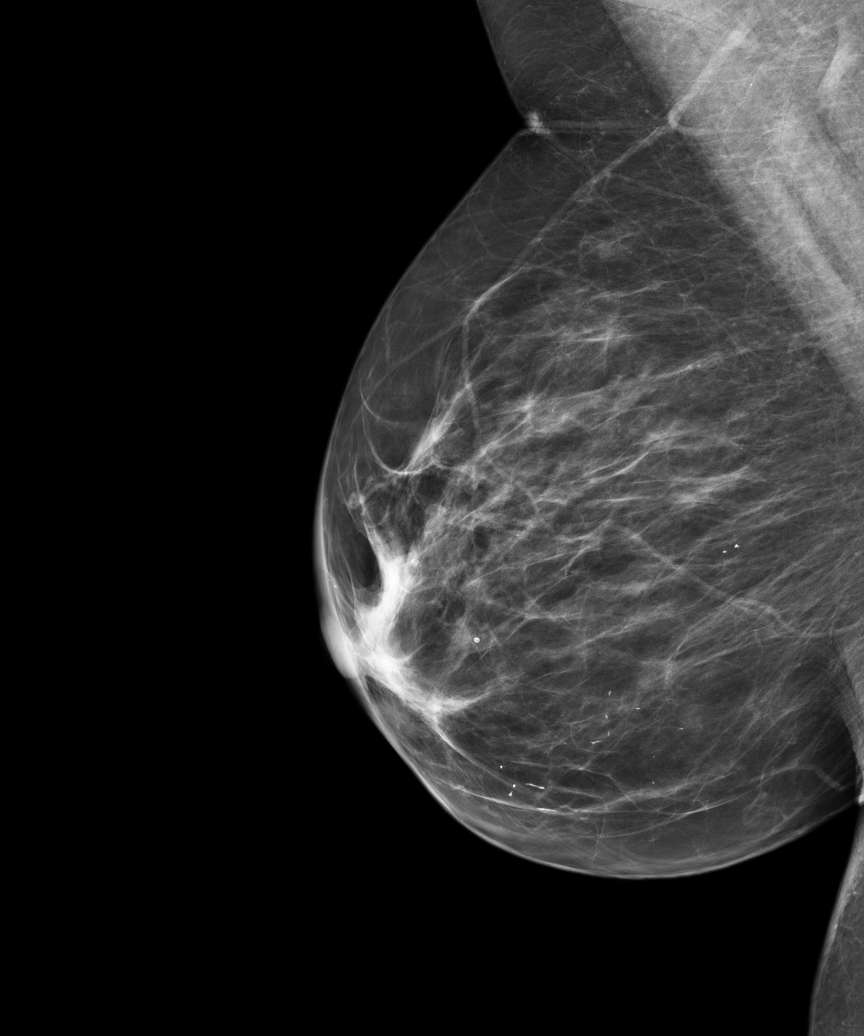
[im 4/5]
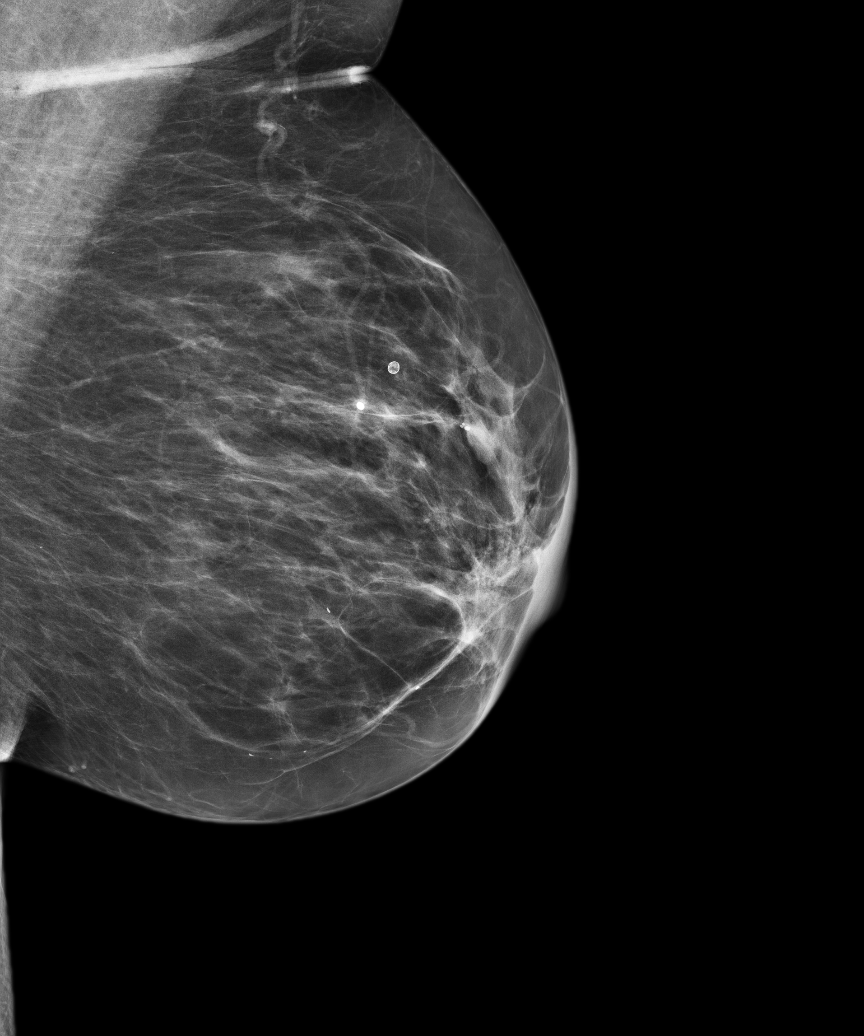
[im 5/5]
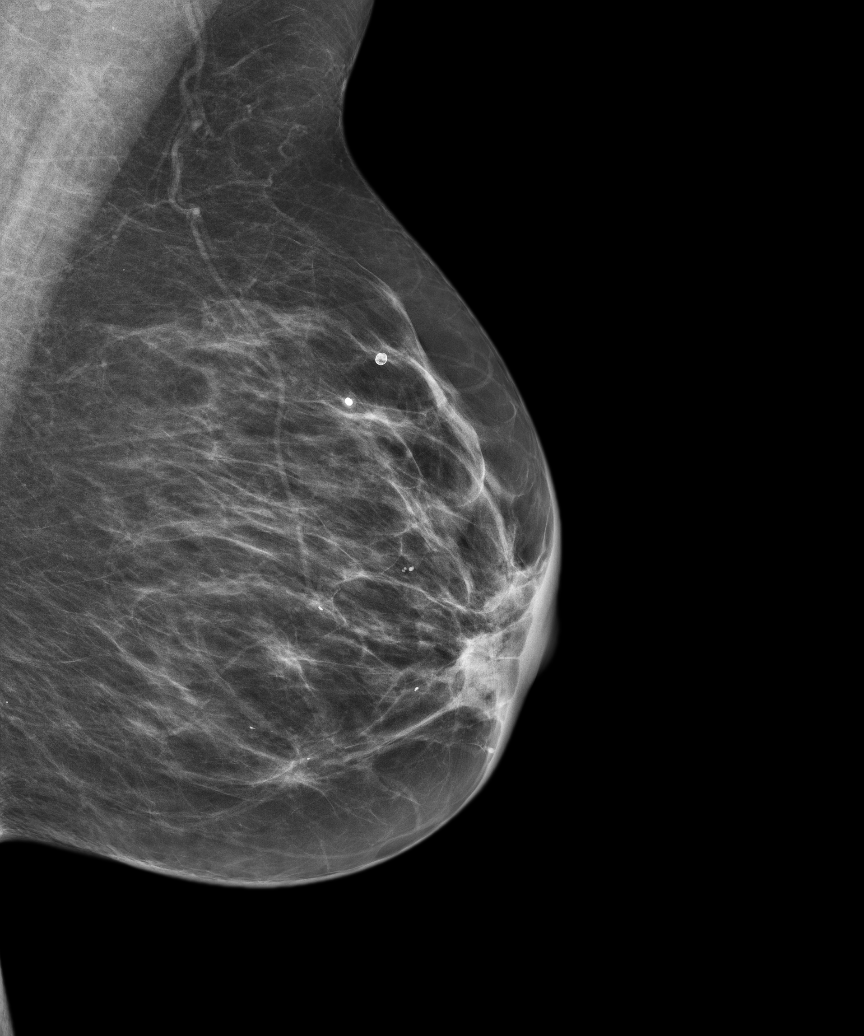

[5 of 5 positions shown; findings below may reference images not displayed]

PROCEDURE:     MAM - MAM DGTL SCRN MAM NO ORDER W/CAD  - [DATE]  [DATE]

RESULT:     There is a family history of breast cancer in the patient's
aunt. The patient denies previous breast surgery or breast cancer history.
Comparison is made to previous digital images dated [DATE] common
[DATE] and [DATE] area there is a heterogeneous pattern of parenchyma
without a dominant mass, developing density, malignant calcification or
architectural distortion. Stable benign appearing calcifications are present.
IMPRESSION: Stable, benign appearing bilateral mammogram. Please
continue to encourage annual mammographic followup and monthly breast self
exam. BREAST COMPOSITION: The breast composition is HETEROGENEOUSLY DENSE
(glandular tissue is 51-75%) This may decrease the sensitivity of
mammography.  BI-RADS: Category 2- Benign Finding

A NEGATIVE MAMMOGRAM REPORT DOES NOT PRECLUDE BIOPSY OR OTHER EVALUATION OF
A CLINICALLY PALPABLE OR OTHERWISE SUSPICIOUS MASS OR LESION. BREAST CANCER
MAY NOT BE DETECTED IN UP TO 10% OF CASES.

[REDACTED]

## 2013-09-08 ENCOUNTER — Ambulatory Visit: Payer: Self-pay | Admitting: Internal Medicine

## 2013-09-28 ENCOUNTER — Ambulatory Visit: Payer: Self-pay | Admitting: Internal Medicine

## 2013-10-28 ENCOUNTER — Ambulatory Visit: Payer: Self-pay | Admitting: Internal Medicine

## 2014-09-07 ENCOUNTER — Emergency Department: Payer: Self-pay | Admitting: Emergency Medicine

## 2014-09-07 IMAGING — CT CT HEAD WITHOUT CONTRAST
1 series · 16 of 30 positions shown, 20 images · non-contrast
Comparison: Report of CT scan dated [DATE] end brain MR dated
[DATE] bone

CLINICAL DATA: Left frontal headache with photosensitivity.

EXAM:
CT HEAD WITHOUT CONTRAST
TECHNIQUE: Contiguous axial images were obtained from the base of the skull
through the vertex without intravenous contrast.

[Series 2: head wo · axial · 0.38mm/px · z∈[+463,+593]mm · 16 of 30 slices shown, 20 images]
[im 2/30  brain]
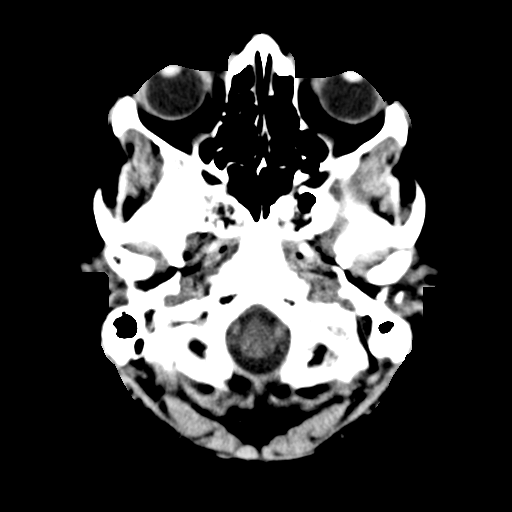
[im 2/30  bone]
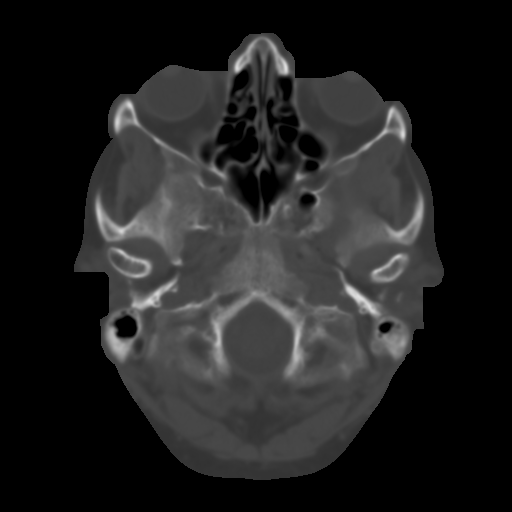
[im 4/30  brain]
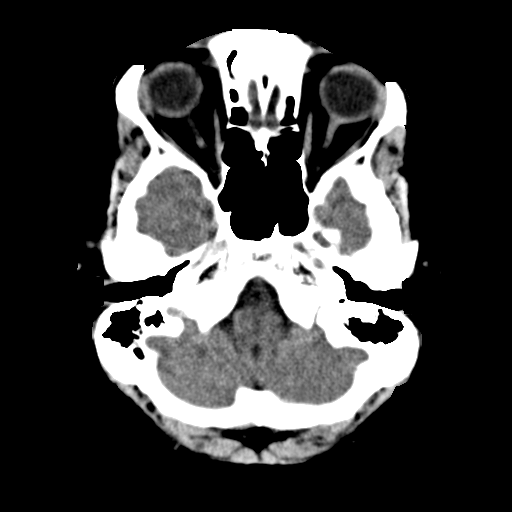
[im 6/30  brain]
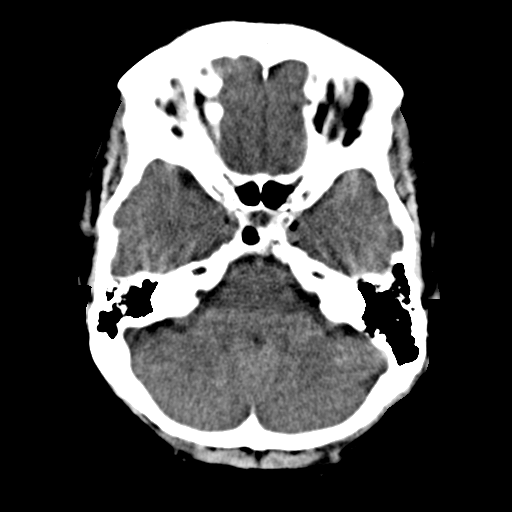
[im 8/30  brain]
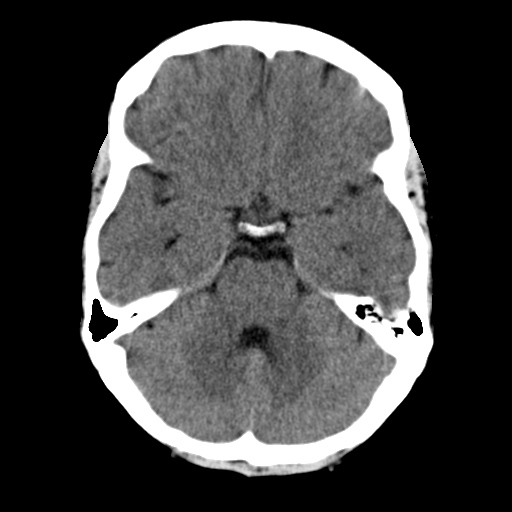
[im 9/30  brain]
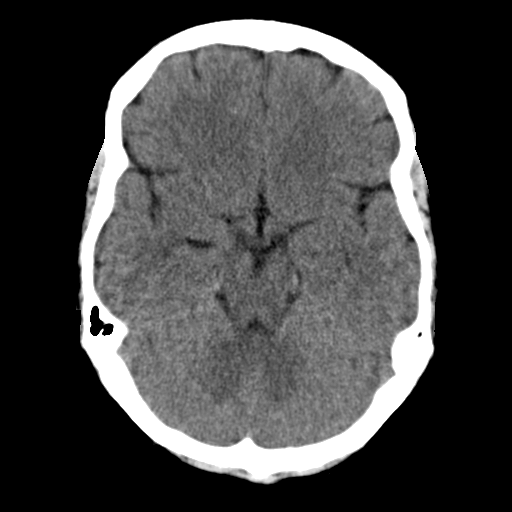
[im 9/30  bone]
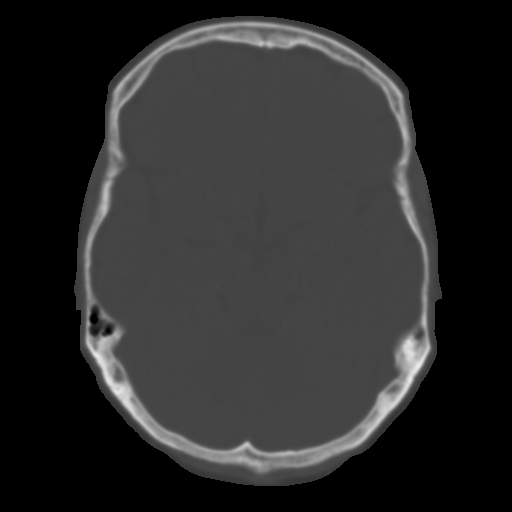
[im 11/30  brain]
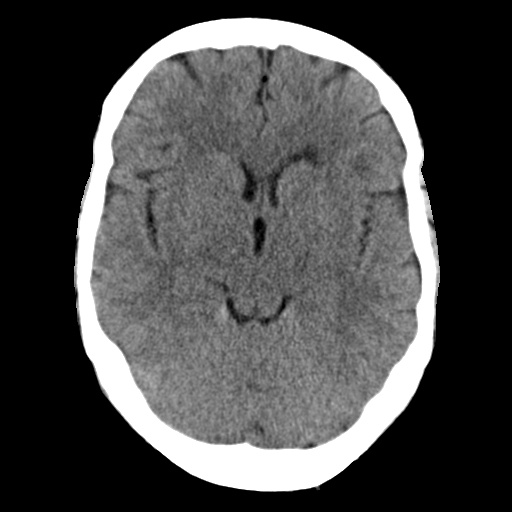
[im 13/30  brain]
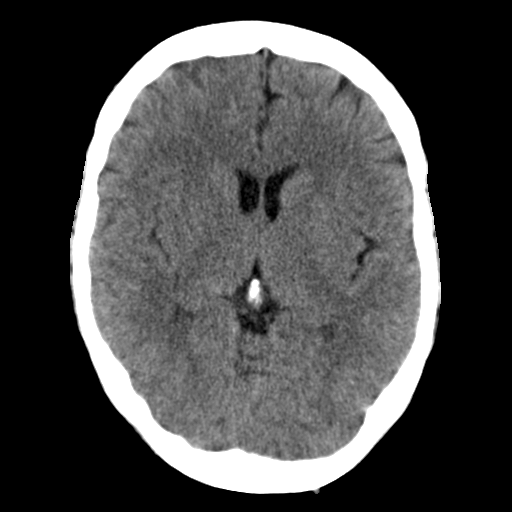
[im 15/30  brain]
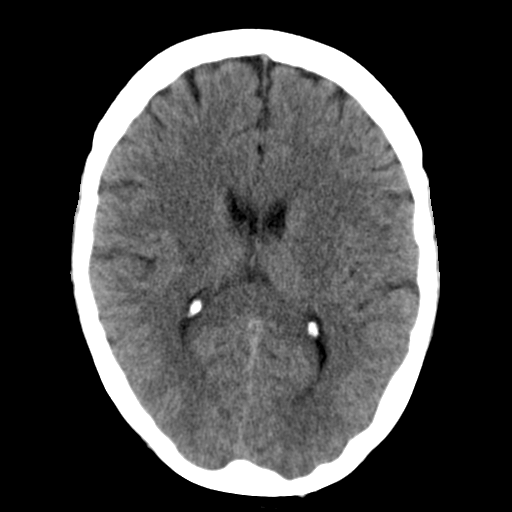
[im 16/30  brain]
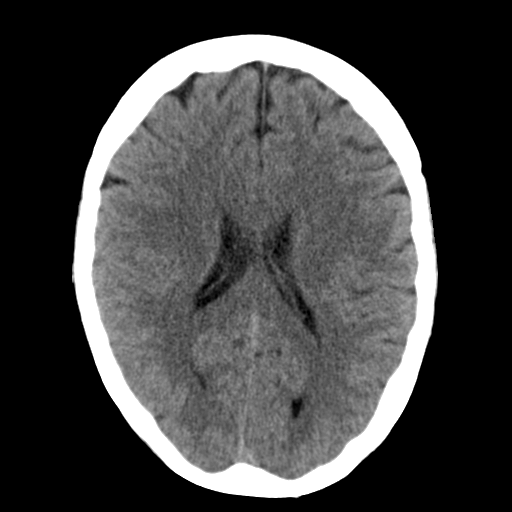
[im 16/30  bone]
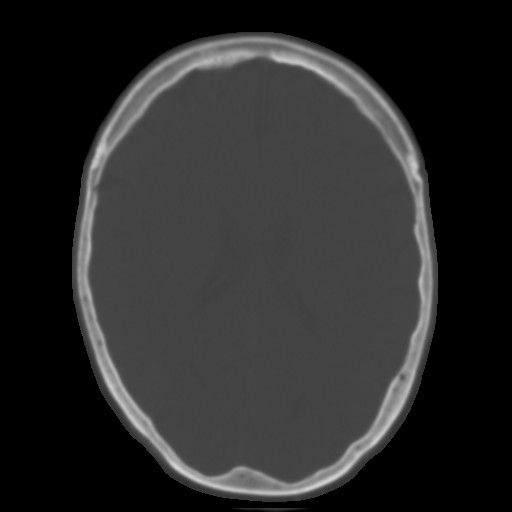
[im 18/30  brain]
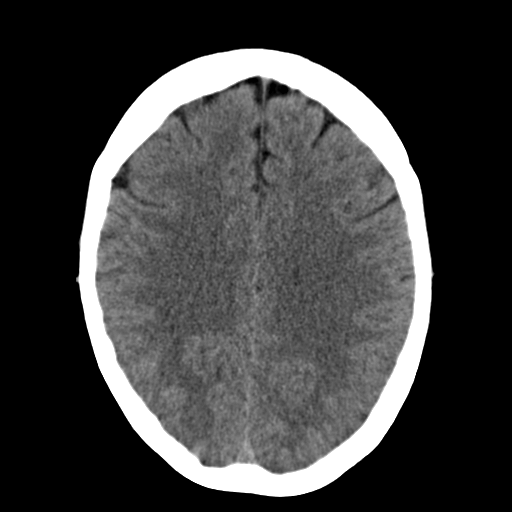
[im 20/30  brain]
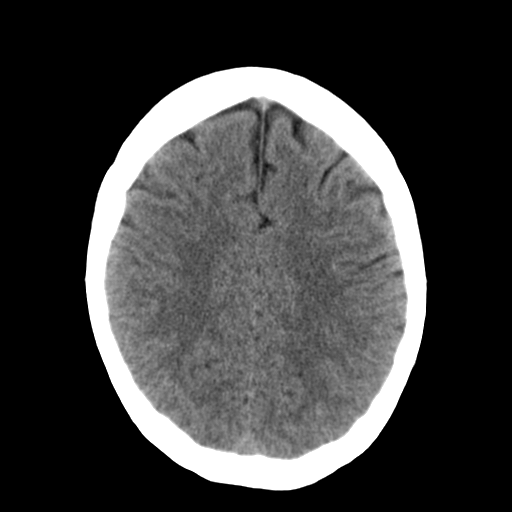
[im 22/30  brain]
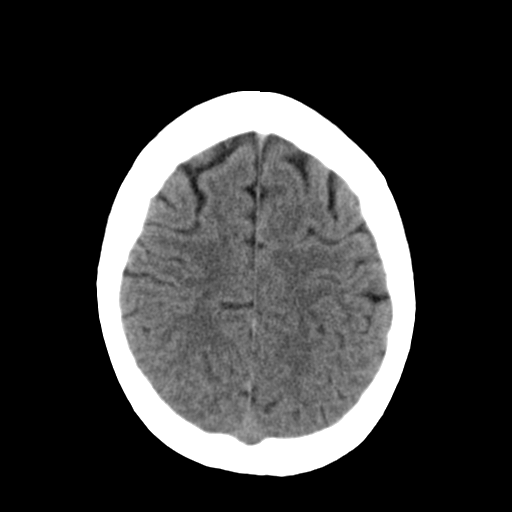
[im 23/30  brain]
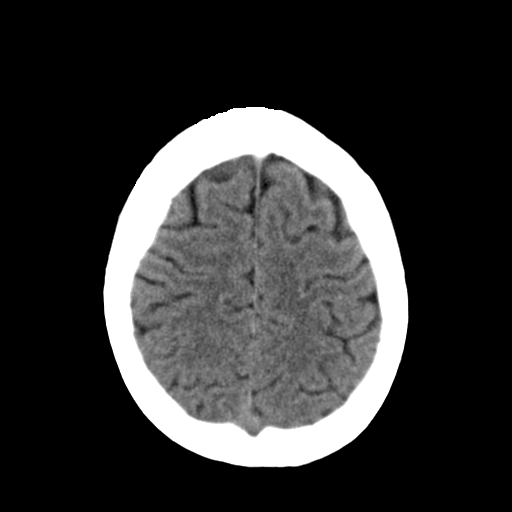
[im 23/30  bone]
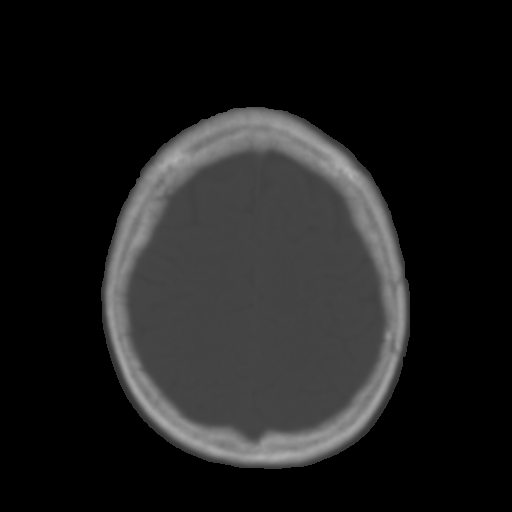
[im 25/30  brain]
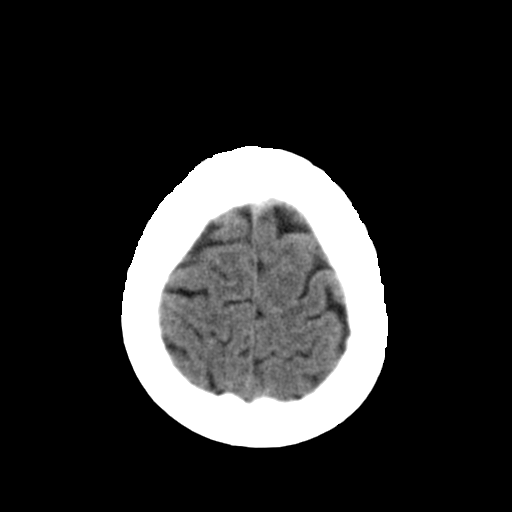
[im 27/30  brain]
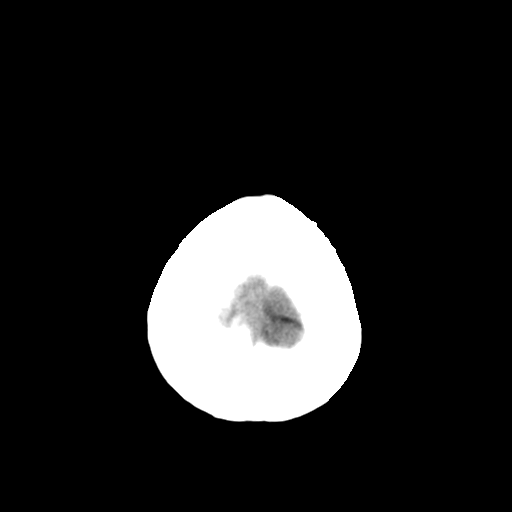
[im 29/30  brain]
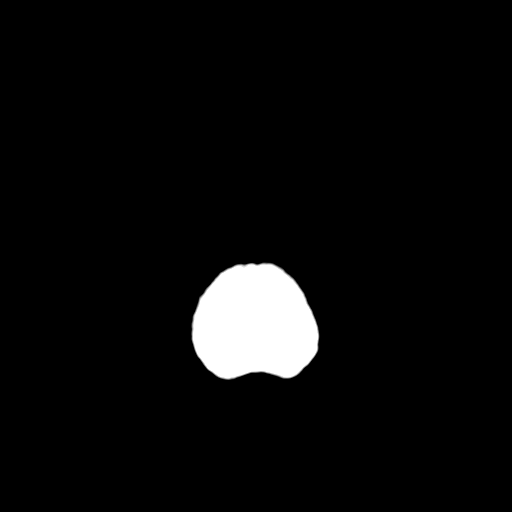

[16 of 30 positions shown; findings below may reference images not displayed]

FINDINGS: There is no acute intracranial hemorrhage, acute infarction, or mass
lesion.

There is a chronic old infarct in the anterior aspect of the head of
the left caudate nucleus, described on the prior brain MR. brain
parenchyma is otherwise normal. No osseous abnormality.
IMPRESSION: No acute abnormalities.

## 2014-11-21 NOTE — Op Note (Signed)
PATIENT NAME:  Amy Hansen, TOOTHAKER MR#:  [id number] DATE OF BIRTH:  19-Jun-1956  DATE OF PROCEDURE:  12/12/2011  PREOPERATIVE DIAGNOSIS: Hemorrhoids.   POSTOPERATIVE DIAGNOSIS: Hemorrhoids.   PROCEDURE: Hemorrhoidectomy.   SURGEON: Loreli Dollar, MD   ANESTHESIA: General.   INDICATIONS: This 60 year old female has history of anal pain, swelling, itching, burning, and bleeding. She had physical findings of large internal and external hemorrhoids. There were four complexes identified and surgery was recommended for definitive treatment.   DESCRIPTION OF PROCEDURE: The patient was placed on the operating table in the supine position under general anesthesia. Legs were elevated into the lithotomy position using ankle straps. The anal area was prepared with Betadine solution and draped in a sterile manner. Initial inspection revealed that there were four hemorrhoid complexes at approximately 2 o'clock, 4 o'clock, 8 o'clock, and 11 o'clock. The anoderm was infiltrated with 0.5% Sensorcaine with epinephrine and also deeper tissues infiltrated as well using a total of 10 mL. Next, the anal canal was dilated large enough to admit three fingers. The bivalve anal retractor was introduced. There were internal hemorrhoids which were moderately enlarged which corresponded to the hemorrhoids found on the outside. There was a rectocele demonstrated which was moderate in size. No fissure was seen.   First at the 4 o'clock position the upper extent of the internal component was ligated with 3-0 chromic suture ligature and external V-shaped incision was made. The external hemorrhoid was dissected away from surrounding subcutaneous tissues with scissors. This dissection was carried up over the internal anal sphincter and continued with electrocautery up to the previously placed suture ligature and the hemorrhoid was again ligated and was excised. The wound was inspected. Several small bleeding points were cauterized. Next,  the wound was closed with a running locked tied 3-0 chromic suture leaving a small opening externally for drainage. Next, a similar procedure was carried out at the 2 o'clock position. Next, a similar procedure was carried out at the 11 o'clock position. Next, a similar procedure was carried out at the 8 o'clock position for a total of four internal and external hemorrhoidectomies.   There was another fairly prominent external hemorrhoidal skin tag which was excised with an obliquely oriented incision using scissors and this extended from the 12 o'clock to the 2 o'clock position. This wound was hemostatic and was closed with interrupted 5-0 chromic simple sutures.   Following this, the wound was again dressed with Betadine solution. The skin and subcutaneous tissues were then infiltrated with 20 mL of Exparel. Following this, dressings were applied with paper tape. The patient tolerated surgery satisfactorily and was then prepared for transfer to the recovery room.   ____________________________ Lenna Sciara. Rochel Brome, MD [id number] D: 12/12/2011 09:06:22 ET T: 12/12/2011 09:55:19 ET JOB#: [id number]  cc: Loreli Dollar, MD, <Dictator> Loreli Dollar MD ELECTRONICALLY SIGNED 12/12/2011 20:07

## 2014-11-21 NOTE — Discharge Summary (Signed)
PATIENT NAME:  Amy Hansen, Amy Hansen MR#:  [id number] DATE OF BIRTH:  1955/12/19  DATE OF ADMISSION:  01/09/2012 DATE OF DISCHARGE:  01/10/2012  ADMISSION DIAGNOSIS: Chest pain.   DISCHARGE DIAGNOSES:  1. Chest pain, likely not cardiac, possible gastrointestinal-related, negative stress test.  2. Hyperlipidemia.  3. Diabetes-type 2, not on any treatment. Blood glucose elevated on admission but then normalized.  4. History of anxiety.  5. Depression. 6. Bipolar disorder.  7. Hemorrhoids, status post hemorrhoidectomy.  8. Asthma.  9. History of allergic rhinitis.  10. 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 Ma Hillock.  11. History of iron deficiency anemia.  12. History of morbid obesity, status post gastric bypass surgery in July 2009.  13. Possible gastroparesis and needs outpatient Gastroenterology followup for any further evaluation.   PERTINENT LABORATORY, DIAGNOSTIC AND RADIOLOGICAL DATA:  EKG shows normal sinus rhythm without any ST-T wave changes.  CPK 48. CK-MB was less than 0.5. Troponin was less than 0.02.  CBC was normal.  BMP: Sodium 131, potassium 4.1, chloride 97, bicarbonate 26, BUN 6, creatinine 0.76, glucose 259, calcium 8.7.  Chest x-ray: Negative.  D-dimer was less than 0.02.   Lexiscan showed no evidence of ischemia or infarct. Ejection fraction 50%.   HOSPITAL COURSE: Please refer to History and Physical done by the admitting physician. The patient is a 59 year old white female who presented with multiple complaints of having some palpitations, chest pain, numbness in her face. The patient, due to her symptoms, was seen in the ED and was placed under observation for chest pain. Serial cardiac enzymes were negative. She underwent a stress MIBI which was negative. She does have a history of gastric bypass, and her symptoms could be related to possible gastroesophageal reflux disease. She could also have  gastroparesis. She will be tried on PPIs. Consider outpatient gastric emptying study. The patient otherwise is doing much better and is stable for discharge.   DISCHARGE MEDICATIONS:  1. Lamictal 150 mg, 2 tabs daily.  2. Lunesta 3 mg at bedtime.  3. Folic acid 1 mg daily.  4. Bupropion 150 mg, 3 tabs daily.  5. Risperidone 3 mg daily.  6. Protonix 40 mg daily.   DIET: ADA diet.   ACTIVITY: As tolerated.   TIMEFRAME FOR FOLLOWUP:  1. Follow up in 1 to 2 weeks with Dr. Jeananne Rama.  2. Follow up in 1 to 2 weeks with Dr. Vira Agar.   TIME SPENT ON DISCHARGE:  25 minutes spent.  ____________________________ Lafonda Mosses. Posey Pronto, MD shp:cbb D: 01/11/2012 10:29:16 ET T: 01/11/2012 11:40:42 ET JOB#: [id number]  cc: Samaa Ueda H. Posey Pronto, MD, <Dictator> Guadalupe Maple, MD Alric Seton MD ELECTRONICALLY SIGNED 01/21/2012 14:48

## 2014-11-21 NOTE — H&P (Signed)
PATIENT NAME:  Amy Hansen, Amy Hansen MR#:  [id number] DATE OF BIRTH:  April 24, 1956  DATE OF ADMISSION:  01/09/2012  REFERRING PHYSICIAN: Dr. Robet Leu. PRIMARY CARE PHYSICIAN:  Dr. Jeananne Rama.   CHIEF COMPLAINT: "I've been having pain in my chest."   HISTORY OF PRESENT ILLNESS: The patient is a 59 year old female with past medical history as listed below who presented to the Emergency Department with the above-mentioned chief complaint. The patient states that she has had chest pain which developed while at rest while sitting at her computer which started yesterday and persisted until today located in the substernal portion of her chest radiating through to her back associated with shortness of breath, nausea, and diaphoresis, but no vomiting. Chest pain has also radiated to the left arm some. She denies fevers or chills. Chest pain is described as a squeezing sensation. Chest pain is five out of ten in intensity at its worst. The chest pain is better with rest and relaxation and there are no exacerbating factors. She came to the ER for further evaluation of her symptoms. She is noted to have a normal set of cardiac enzymes with negative troponin and EKG is unremarkable. Thereafter, hospitalist services were contacted for further evaluation and for hospital admission. Otherwise, she is without specific complaints at this time.   PAST SURGICAL HISTORY:  1. Cholecystectomy. 2. Hysterectomy.  3. Gastric bypass surgery in July 2009.  4. Hemorrhoidectomy.   PAST MEDICAL HISTORY:    1. Hyperlipidemia.  2. Type 2 diabetes mellitus, but the patient states that after her gastric bypass surgery she is no longer taking any medications for her diabetes and her sugars have been overall relatively well controlled.  3. History of anxiety. 4. Depression--bipolar disorder.  5. Hemorrhoids status post hemorrhoidectomy.  6. Asthma.  7. History of allergic rhinitis. 8. 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 The patient is followed by Dr. Ma Hillock.  9. History of iron deficiency anemia followed by Dr. Ma Hillock.  10. History of morbid obesity status post gastric bypass surgery July 2009.   ALLERGIES: Avelox, morphine, Stadol, tape, but she states she can tolerate Percocet and has done so in the ER today.   MEDICATIONS:  1. Wellbutrin XL 150 mg 3 tablets p.o. daily for a total of 450 mg daily.  2. Lamictal 300 mg at bedtime.  3. Risperdal 3 mg at bedtime. 4. Lunesta 3 mg at bedtime.   FAMILY HISTORY: Mother alive age at 48, relatively healthy. Father deceased at age 69. History of stroke. History of angina.   SOCIAL HISTORY: Negative for tobacco, alcohol, or illicit drug use. The patient lives in [place], [place]. She works as a Charity fundraiser for [employer]. She comes accompanied by her husband today.   REVIEW OF SYSTEMS: CONSTITUTIONAL: Reports chest pain associated with shortness of breath. Denies fevers, chills, or recent changes in weight or fatigue. Denies any weakness. HEAD/EYES: Denies headache or blurry vision. ENT: Denies tinnitus, earache, nasal discharge, or sore throat. RESPIRATORY: Has some shortness of breath associated with her chest pain. Denies cough or wheezing. CARDIOVASCULAR: Reports chest pain and also some heart palpitations at times. Denies lower extremity edema or orthopnea. GASTROINTESTINAL: Has some nausea. Denies vomiting, diarrhea, constipation, melena, hematochezia, or abdominal pain. GENITOURINARY: Denies dysuria or hematuria. ENDOCRINE: Denies heat or cold intolerance. HEME/LYMPH: Denies easy bruising or bleeding. INTEGUMENTARY: Denies rash or lesions. MUSCULOSKELETAL: She had some chest pain radiating to her back. Denies muscle weakness. NEUROLOGIC: Denies headache, numbness, weakness, tingling, or dysarthria. PSYCH:  Has underlying depression and anxiety. Denies suicidal or homicidal ideation.   PHYSICAL EXAMINATION:  VITAL SIGNS: Temperature 97.9, pulse 70,  blood pressure 132/89 on arrival, currently 119/76 respirations 18, oxygen saturation 99-100% on room air.   GENERAL: The patient is alert and oriented, not in acute distress.   HEENT: Normocephalic, atraumatic. Pupils are equal, round and reactive to light accommodation. Extraocular muscles are intact. Anicteric sclerae. Conjunctivae pink. Hearing intact to voice. Lids are without drainage. Oral mucosa moist without lesions.   NECK: Supple with full range of motion. No jugular venous distention, lymphadenopathy, or carotid bruits bilaterally. No thyromegaly or tenderness to palpation over thyroid gland.   CHEST: Normal respiratory effort without use of accessory respiratory muscles.   LUNGS: Clear to auscultation bilaterally without crackles, rales, or wheezes.   CARDIOVASCULAR: S1, S2 positive. Regular rate and rhythm. No murmurs, rubs, or gallops. PMI is non-lateralized. No tenderness to palpation over anterior chest wall.   ABDOMEN: Soft, nontender, nondistended. Normoactive bowel sounds. No hepatosplenomegaly or palpable masses. No hernias.   EXTREMITIES: No clubbing, cyanosis, or edema. Pedal pulses are palpable bilaterally.   SKIN: No suspicious rashes. Skin turgor is good.   LYMPH: No cervical lymphadenopathy.   NEUROLOGIC: Alert and oriented x3. Cranial nerves II through XII are grossly intact. No focal deficits.   PSYCH: Somewhat flattened affect. Otherwise, pleasant female and otherwise her affect is appropriate.   LABORATORY, RADIOLOGICAL AND DIAGNOSTIC DATA: EKG: Normal sinus rhythm, heart rate 78 beats per minute with normal axis, normal intervals, no acute ST or T wave changes. Cardiac enzymes were within normal limits. CK 48, CK-MB less than 0.5, troponin less than 0.02. CBC within normal limits with normal WBC, RBC, hemoglobin, hematocrit and platelet counts. BMP: Sodium 131, potassium 4.1, chloride 97, bicarbonate 26, BUN 6, creatinine 0.76, glucose 259, calcium 8.7, anion  gap 8. Preliminary report on chest x-ray PA and lateral: No acute cardiopulmonary abnormalities. Awaiting official read. D-dimer is pending at this time.   ASSESSMENT AND PLAN: 59 year old female with past medical history of hyperlipidemia, diabetes mellitus, morbid obesity, status post gastric bypass surgery, bipolar disorder with depression, anxiety, asthma, migraines, leukopenia and anemia here with.  1. Chest pain with some typical as well as atypical features. She does have some risk factors for coronary artery disease. Will admit the patient to telemetry unit for observation. We will rule out myocardial infarction by continuing to cycle her cardiac enzymes. In the meanwhile, will start her on oxygen and aspirin therapy and also low dose nitrate. Consider low dose nitrate until she is chest pain free. Also consider beta blocker but currently her blood pressure is normotensive and at goal and her current blood pressure may not allow initiation of beta blocker and we do not want to precipitate hypotension. Therefore, we will hold off on starting this acutely for now. Will risk stratify by checking hemoglobin A1c and lipid fasting lipid profile in a.m. If she rules out for myocardial infarction, we will proceed with treadmill Myoview in the a.m. Of note, she also complains of chest pain radiating to her back and differential diagnoses would include PE and aortic dissection. Will check a d-dimer and if negative/within normal limits, the likelihood of pulmonary embolus would be very less likely. However, she is refusing a chest CT at this time and states that she will only proceed with chest CT if her d-dimer comes back elevated. I have also explained her that differential diagnoses includes aortic dissection and if she has  this that it could potentially lead to death and she understands this risk, but she continues to refuse CT scan at this time. Further work-up and management to follow depending on the  patient's clinical course.  2. Type 2 diabetes mellitus with uncontrolled hyperglycemia. The patient states that her blood sugars have been well controlled after her gastric bypass surgery and she is currently not on any diabetic medications. She currently has hyperglycemia. Will keep her on NovoLog sliding scale insulin and check hemoglobin A1c and if not at goal, consider oral hypoglycemic upon discharge. We will defer to primary team.  3. Bipolar disorder. Continue Lamictal and Wellbutrin as well as Risperdal and she will need close outpatient follow up with a psychiatrist.  4. Anxiety. Will write for p.r.n. Xanax as she states this helps her some and she is actually requesting it.  5. History of leukopenia, (felt to be due to early MDS per oncology) as well as history of iron deficiency anemia. CBC is currently within normal limits with normal white blood cell count and normal hemoglobin and hematocrit and she will need to have this closely monitored as an outpatient and is followed by Dr. Ma Hillock.  6. Asthma, stable, and without acute exacerbation. She is not on any inhalers at home at baseline. We will write for p.r.n. albuterol metered dose inhaler in the event of worsening shortness of breath or wheezing. Currently her oxygen saturations are normal on room air. 7. Deep venous thrombosis prophylaxis. SCDs and TEDs and the patient is ambulatory and we will encourage early ambulation.  8. CODE STATUS: Full code.   TIME SPENT ON ADMISSION: Approximately 45 minutes.   ____________________________ Romie Jumper, MD knl:ap D: 01/09/2012 21:44:16 ET T: 01/10/2012 08:26:21 ET JOB#: [id number]  cc: Romie Jumper, MD, <Dictator> Guadalupe Maple, MD  Romie Jumper MD ELECTRONICALLY SIGNED 01/29/2012 1:18

## 2015-02-08 ENCOUNTER — Ambulatory Visit: Payer: Self-pay | Admitting: Unknown Physician Specialty

## 2015-02-09 ENCOUNTER — Telehealth: Payer: Self-pay | Admitting: Unknown Physician Specialty

## 2015-02-09 NOTE — Telephone Encounter (Signed)
Tried to call and ask patient a few questions about this medication because she should not be out yet. This medication was refilled on 11/23/14 for a 90 day supply with one refill. I left the patient a voicemail and asked for her to return my call.

## 2015-02-09 NOTE — Telephone Encounter (Signed)
E-fax came through for refill on her: Rx: Nitrofurant Copy in basket

## 2015-02-14 ENCOUNTER — Telehealth: Payer: Self-pay | Admitting: Unknown Physician Specialty

## 2015-02-14 ENCOUNTER — Other Ambulatory Visit: Payer: Self-pay

## 2015-02-14 MED ORDER — PRAMIPEXOLE DIHYDROCHLORIDE 0.5 MG PO TABS: 0.5000 mg | ORAL_TABLET | Freq: Every day | ORAL | Status: DC

## 2015-02-14 MED ORDER — NITROFURANTOIN MACROCRYSTAL 50 MG PO CAPS: 50.0000 mg | ORAL_CAPSULE | Freq: Every day | ORAL | Status: DC

## 2015-02-14 NOTE — Telephone Encounter (Signed)
Patient was last seen on 08/10/14, practice partner number is [phone], and pharmacy is [facility] on [address]. I found that the Nitrofurant was last refilled on 11/23/14 for 90 days with one refill. I have tried to call the patient about this medication to find out if she is truly out or what exactly is going on.

## 2015-02-14 NOTE — Telephone Encounter (Signed)
Called and left patient a voicemail to return my call so I can ask her about this medication.

## 2015-02-14 NOTE — Telephone Encounter (Signed)
E-Fax came through for refill on her: Rx: Pramipexole Copy in basket

## 2015-04-06 ENCOUNTER — Encounter: Payer: 59 | Admitting: Unknown Physician Specialty

## 2015-04-19 ENCOUNTER — Encounter: Payer: Self-pay | Admitting: *Deleted

## 2015-04-19 ENCOUNTER — Ambulatory Visit: Payer: 59 | Admitting: Urology

## 2015-04-25 ENCOUNTER — Encounter: Payer: Self-pay | Admitting: Urology

## 2015-04-25 ENCOUNTER — Ambulatory Visit (INDEPENDENT_AMBULATORY_CARE_PROVIDER_SITE_OTHER): Payer: 59 | Admitting: Urology

## 2015-04-25 VITALS — BP 117/79 | HR 79 | Ht 62.0 in | Wt 151.4 lb

## 2015-04-25 DIAGNOSIS — N952 Postmenopausal atrophic vaginitis: Secondary | ICD-10-CM | POA: Diagnosis not present

## 2015-04-25 DIAGNOSIS — N390 Urinary tract infection, site not specified: Secondary | ICD-10-CM | POA: Diagnosis not present

## 2015-04-25 DIAGNOSIS — N941 Unspecified dyspareunia: Secondary | ICD-10-CM

## 2015-04-25 LAB — URINALYSIS, COMPLETE
BILIRUBIN UA: NEGATIVE
KETONES UA: NEGATIVE
Leukocytes, UA: NEGATIVE
NITRITE UA: NEGATIVE
Protein, UA: NEGATIVE
RBC UA: NEGATIVE
SPEC GRAV UA: 1.01 (ref 1.005–1.030)
UUROB: 0.2 mg/dL (ref 0.2–1.0)
pH, UA: 6 (ref 5.0–7.5)

## 2015-04-25 LAB — BLADDER SCAN AMB NON-IMAGING: SCAN RESULT: 104

## 2015-04-25 LAB — MICROSCOPIC EXAMINATION
Bacteria, UA: NONE SEEN
RBC MICROSCOPIC, UA: NONE SEEN /HPF (ref 0–?)

## 2015-04-25 NOTE — Progress Notes (Signed)
04/25/2015 10:56 PM   Amy Hansen 12/25/1955 [id number]  Referring provider: Kathrine Haddock, NP [place], [place] [address]  Chief Complaint  Patient presents with  . Recurrent UTI    the pt was seen at [facility] x2 for UTI in the last 2 mths.     HPI: Patient is a 59 year old white female who is a former patient of [facility] who presents today for further evaluation after experiencing 3 urinary tract infections in less than 1 year.    She had her first infection in March while she was in [place]. She sought treatment in an urgent care and was treated for a urinary tract infection. She believes the organism at that time was Escherichia coli.  She did visit an urgent care here and in town and had a positive urine culture for  Beta-hemolytic Streptococcus, group B on 03/07/2015 and 04/12/2015.  Her urinary tract infection symptoms consist of bladder spasms, pain and stinging when urinating and back pain. She is not sexually active due to dyspareunia and does not suffer with constipation. She has been experiencing diarrhea. She is uncertain whether this is from the antibiotics given to her for the urinary tract infections or if the diarrhea started before infections.  She is not experiencing gross hematuria or suprapubic pain. She has not had fevers, chills, nausea or vomiting with these infections. She does not have a prior history of nephrolithiasis.  Her baseline urinary symptoms consist of nocturia 3 and voiding 5-6 times during the day. She does not have urinary incontinence. She did not have microscopic hematuria on the urinalysis from 03/07/2015, 04/12/2015 or on today's UA.  PMH: Past Medical History  Diagnosis Date  . Anxiety   . Depression   . Diabetes   . Frequent headaches   . Recurrent UTI   . Urinary frequency     Surgical History: Past Surgical History  Procedure Laterality Date  . Cholecystectomy  1975  . Hemorrhoid surgery  2013    . Gastric bypass  2010  . Carpal tunnel release Right 2003  . Knee arthroscopy Left 1996  . Abdominal hysterectomy      Home Medications:    Medication List       This list is accurate as of: 04/25/15 11:59 PM.  Always use your most recent med list.               ALPRAZolam 0.5 MG tablet  Commonly known as:  XANAX  Take 0.5 mg by mouth at bedtime as needed for anxiety.     buPROPion 150 MG 12 hr tablet  Commonly known as:  ZYBAN  Take 150 mg by mouth 2 (two) times daily.     buPROPion 150 MG 24 hr tablet  Commonly known as:  WELLBUTRIN XL  Take by mouth.     cephALEXin 500 MG capsule  Commonly known as:  KEFLEX  Take by mouth.     Eszopiclone 3 MG Tabs  Take by mouth.     fluticasone 50 MCG/ACT nasal spray  Commonly known as:  FLONASE  Place into the nose.     lamoTRIgine 150 MG tablet  Commonly known as:  LAMICTAL  Take 150 mg by mouth daily.     nitrofurantoin 50 MG capsule  Commonly known as:  MACRODANTIN  Take by mouth.     pramipexole 0.5 MG tablet  Commonly known as:  MIRAPEX  Take 1 tablet (0.5 mg total) by mouth at  bedtime.     risperiDONE 1 MG tablet  Commonly known as:  RISPERDAL  Take by mouth.     zaleplon 10 MG capsule  Commonly known as:  SONATA        Allergies:  Allergies  Allergen Reactions  . Avelox [Moxifloxacin Hcl In Nacl] Anaphylaxis  . Stadol [Butorphanol] Rash    Family History: Family History  Problem Relation Age of Onset  . Stroke Father   . Bladder Cancer Neg Hx   . Kidney disease Neg Hx   . Prostate cancer Neg Hx     Social History:  reports that she quit smoking about 40 years ago. She does not have any smokeless tobacco history on file. She reports that she does not drink alcohol or use illicit drugs.  ROS: UROLOGY Frequent Urination?: Yes Hard to postpone urination?: No Burning/pain with urination?: No Get up at night to urinate?: Yes Leakage of urine?: No Urine stream starts and stops?:  No Trouble starting stream?: Yes Do you have to strain to urinate?: No Blood in urine?: No Urinary tract infection?: Yes Sexually transmitted disease?: No Injury to kidneys or bladder?: No Painful intercourse?: Yes Weak stream?: No Currently pregnant?: No Vaginal bleeding?: No Last menstrual period?: n  Gastrointestinal Nausea?: No Vomiting?: No Indigestion/heartburn?: No Diarrhea?: No Constipation?: No  Constitutional Fever: No Night sweats?: No Weight loss?: Yes Fatigue?: Yes  Skin Skin rash/lesions?: No Itching?: No  Eyes Blurred vision?: No Double vision?: No  Ears/Nose/Throat Sore throat?: No Sinus problems?: No  Hematologic/Lymphatic Swollen glands?: No Easy bruising?: Yes  Cardiovascular Leg swelling?: No Chest pain?: No  Respiratory Cough?: No Shortness of breath?: No  Endocrine Excessive thirst?: No  Musculoskeletal Back pain?: Yes Joint pain?: No  Neurological Headaches?: Yes Dizziness?: No  Psychologic Depression?: No Anxiety?: No  Physical Exam: BP 117/79 mmHg  Pulse 79  Ht 5' 2"  (1.575 m)  Wt 151 lb 6.4 oz (68.675 kg)  BMI 27.68 kg/m2  Constitutional:  Alert and oriented, No acute distress. HEENT: [place] AT, moist mucus membranes.  Trachea midline, no masses. Cardiovascular: No clubbing, cyanosis, or edema. Respiratory: Normal respiratory effort, no increased work of breathing. GI: Abdomen is soft, nontender, nondistended, no abdominal masses GU: No CVA tenderness. Atrophic external genitalia.  Normal urethral meatus. No urethral masses and/or tenderness. No bladder fullness or masses. No vaginal lesions or discharge. Normal rectal tone, no masses. Normal anus and perineum.  Skin: No rashes, bruises or suspicious lesions. Lymph: No cervical or inguinal adenopathy. Neurologic: Grossly intact, no focal deficits, moving all 4 extremities. Psychiatric: Normal mood and affect.  Laboratory Data: Lab Results  Component Value Date    WBC 3.0* 01/10/2012   HGB 12.3 01/10/2012   HCT 37.4 01/10/2012   MCV 89 01/10/2012   PLT 225 01/10/2012   Lab Results  Component Value Date   CREATININE 0.58* 01/10/2012   Lab Results  Component Value Date   HGBA1C 6.6* 01/10/2012    Urinalysis Results for orders placed or performed in visit on 04/25/15  Microscopic Examination  Result Value Ref Range   WBC, UA 0-5 0 -  5 /hpf   RBC, UA None seen 0 -  2 /hpf   Epithelial Cells (non renal) 0-10 0 - 10 /hpf   Bacteria, UA None seen None seen/Few  Urinalysis, Complete  Result Value Ref Range   Specific Gravity, UA 1.010 1.005 - 1.030   pH, UA 6.0 5.0 - 7.5   Color, UA Yellow Yellow  Appearance Ur Clear Clear   Leukocytes, UA Negative Negative   Protein, UA Negative Negative/Trace   Glucose, UA 3+ (A) Negative   Ketones, UA Negative Negative   RBC, UA Negative Negative   Bilirubin, UA Negative Negative   Urobilinogen, Ur 0.2 0.2 - 1.0 mg/dL   Nitrite, UA Negative Negative   Microscopic Examination See below:    Pertinent Imaging: Results for ZANE, SAMSON (MRN [id number]) as of 04/25/2015 15:47  Ref. Range 04/25/2015 15:38  Scan Result Unknown 104    Assessment & Plan:   1. Recurrent UTI:   Patient has had 3 urinary tract infections and less than 1 year.  They have responded to antibiotics, but patient is fearful that they may continue. She has a remote history of recurrent urinary tract infections. Her UA today is unremarkable.    - Urinalysis, Complete - Bladder Scan (Post Void Residual) in office  2. Atrophic vaginitis:   I explained to the patient how the condition of atrophic vaginitis contributes to recurrent urinary tract infections. Patient was given a sample of vaginal estrogen cream (Premarin) and instructed to apply 0.19m (pea-sized amount)  just inside the vaginal introitus with a finger-tip every night for two weeks and then Monday, Wednesday and Friday nights.  I explained to the patient that  vaginally administered estrogen, which causes only a slight increase in the blood estrogen levels, have fewer contraindications and adverse systemic effects that oral HT.  3. Dyspareunia:   Patient's pain during sexual intercourse is frictional in nature. I explained to her how the vaginal estrogen cream can help rebuild her vaginal mucosa and increased mucous secretions.  Return in about 2 weeks (around 05/09/2015) for UA and exam.  SZara Council [facility] [address] [place] [phone]

## 2015-04-25 NOTE — Progress Notes (Signed)
Bladder Scan Patient} void: 104 ml Performed By: Larna Daughters

## 2015-04-26 ENCOUNTER — Encounter: Payer: Self-pay | Admitting: Urology

## 2015-04-26 DIAGNOSIS — N941 Unspecified dyspareunia: Secondary | ICD-10-CM | POA: Insufficient documentation

## 2015-04-26 DIAGNOSIS — N390 Urinary tract infection, site not specified: Secondary | ICD-10-CM | POA: Insufficient documentation

## 2015-04-26 DIAGNOSIS — N952 Postmenopausal atrophic vaginitis: Secondary | ICD-10-CM | POA: Insufficient documentation

## 2015-05-12 ENCOUNTER — Telehealth: Payer: Self-pay

## 2015-05-12 DIAGNOSIS — B379 Candidiasis, unspecified: Secondary | ICD-10-CM

## 2015-05-12 MED ORDER — FLUCONAZOLE 100 MG PO TABS: 100.0000 mg | ORAL_TABLET | Freq: Every day | ORAL | Status: DC

## 2015-05-12 NOTE — Telephone Encounter (Signed)
Pt called c/o having a severe yeast infection post abx therapy. Per Dr. Pilar Jarvis pt was given 1 diflucan pill. Pt voiced understanding. Medication called into pt pharmacy.

## 2015-05-18 ENCOUNTER — Ambulatory Visit (INDEPENDENT_AMBULATORY_CARE_PROVIDER_SITE_OTHER): Payer: 59 | Admitting: Urology

## 2015-05-18 ENCOUNTER — Encounter: Payer: Self-pay | Admitting: Urology

## 2015-05-18 ENCOUNTER — Telehealth: Payer: Self-pay | Admitting: Urology

## 2015-05-18 VITALS — BP 123/75 | HR 85 | Ht 62.0 in | Wt 157.5 lb

## 2015-05-18 DIAGNOSIS — N952 Postmenopausal atrophic vaginitis: Secondary | ICD-10-CM

## 2015-05-18 DIAGNOSIS — N941 Unspecified dyspareunia: Secondary | ICD-10-CM | POA: Diagnosis not present

## 2015-05-18 DIAGNOSIS — N390 Urinary tract infection, site not specified: Secondary | ICD-10-CM

## 2015-05-18 LAB — MICROSCOPIC EXAMINATION
Bacteria, UA: NONE SEEN
RBC MICROSCOPIC, UA: NONE SEEN /HPF (ref 0–?)

## 2015-05-18 LAB — URINALYSIS, COMPLETE
BILIRUBIN UA: NEGATIVE
KETONES UA: NEGATIVE
NITRITE UA: NEGATIVE
Protein, UA: NEGATIVE
RBC UA: NEGATIVE
SPEC GRAV UA: 1.01 (ref 1.005–1.030)
Urobilinogen, Ur: 0.2 mg/dL (ref 0.2–1.0)
pH, UA: 5.5 (ref 5.0–7.5)

## 2015-05-18 NOTE — Telephone Encounter (Signed)
Would you call in the compounded vaginal estrogen cream to [facility]?

## 2015-05-18 NOTE — Progress Notes (Signed)
4:47 PM   Amy Hansen [birth year] [id number]  Referring provider: Kathrine Haddock, NP [place], [place] [address]  Chief Complaint  Patient presents with  . Recurrent UTI    one month follow up  . Vaginitis    HPI: Patient is a 59 year old white female who is a former patient of [facility] who presents today after being placed on vaginal estrogen cream for recurrent urinary tract infections, dyspareunia and vaginal atrophy.   Previous history: She had her first infection in March while she was in [place]. She sought treatment in an urgent care and was treated for a urinary tract infection. She believes the organism at that time was Escherichia coli.  She did visit an urgent care here and in town and had a positive urine culture for  Beta-hemolytic Streptococcus, group B on 03/07/2015 and 04/12/2015.  Her urinary tract infection symptoms consist of bladder spasms, pain and stinging when urinating and back pain. She is not sexually active due to dyspareunia and does not suffer with constipation. She has been experiencing diarrhea. She is uncertain whether this is from the antibiotics given to her for the urinary tract infections or if the diarrhea started before infections.  She is not experiencing gross hematuria or suprapubic pain. She has not had fevers, chills, nausea or vomiting with these infections. She does not have a prior history of nephrolithiasis.  Her baseline urinary symptoms consist of nocturia 3 and voiding 5-6 times during the day. She does not have urinary incontinence. She did not have microscopic hematuria on the urinalysis from 03/07/2015, 04/12/2015 or on today's UA.  Today, she did experience a vaginal yeast infection last week for which she was prescribed Diflucan. She no longer has the symptoms of the vaginal yeast infection. She is using the vaginal estrogen cream and has seen improvement in her dyspareunia. She is not having symptoms of urinary tract  infection at this time.  Her UA is unremarkable.    PMH: Past Medical History  Diagnosis Date  . Anxiety   . Depression   . Diabetes ([place])   . Frequent headaches   . Recurrent UTI   . Urinary frequency     Surgical History: Past Surgical History  Procedure Laterality Date  . Cholecystectomy  1975  . Hemorrhoid surgery  2013  . Gastric bypass  2010  . Carpal tunnel release Right 2003  . Knee arthroscopy Left 1996  . Abdominal hysterectomy      Home Medications:    Medication List       This list is accurate as of: 05/18/15  4:47 PM.  Always use your most recent med list.               ALPRAZolam 0.5 MG tablet  Commonly known as:  XANAX  Take 0.5 mg by mouth at bedtime as needed for anxiety.     buPROPion 150 MG 24 hr tablet  Commonly known as:  WELLBUTRIN XL  Take by mouth.     estrogens (conjugated) 0.625 MG tablet  Commonly known as:  PREMARIN  Take 0.625 mg by mouth daily. Take daily for 21 days then do not take for 7 days.     Eszopiclone 3 MG Tabs  Take by mouth.     fluconazole 100 MG tablet  Commonly known as:  DIFLUCAN  Take 1 tablet (100 mg total) by mouth daily. X 7 days     fluticasone 50 MCG/ACT nasal spray  Commonly known  as:  FLONASE  Place into the nose.     lamoTRIgine 150 MG tablet  Commonly known as:  LAMICTAL  Take 150 mg by mouth daily.     nitrofurantoin 50 MG capsule  Commonly known as:  MACRODANTIN  Take by mouth.     pramipexole 0.5 MG tablet  Commonly known as:  MIRAPEX  Take 1 tablet (0.5 mg total) by mouth at bedtime.     risperiDONE 1 MG tablet  Commonly known as:  RISPERDAL  Take by mouth.     zaleplon 10 MG capsule  Commonly known as:  SONATA        Allergies:  Allergies  Allergen Reactions  . Avelox [Moxifloxacin Hcl In Nacl] Anaphylaxis  . Stadol [Butorphanol] Rash    Family History: Family History  Problem Relation Age of Onset  . Stroke Father   . Bladder Cancer Neg Hx   . Kidney disease Neg  Hx   . Prostate cancer Neg Hx     Social History:  reports that she quit smoking about 40 years ago. She does not have any smokeless tobacco history on file. She reports that she does not drink alcohol or use illicit drugs.  ROS: UROLOGY Frequent Urination?: Yes Hard to postpone urination?: No Burning/pain with urination?: No Get up at night to urinate?: Yes Leakage of urine?: No Urine stream starts and stops?: No Trouble starting stream?: Yes Do you have to strain to urinate?: No Blood in urine?: No Urinary tract infection?: No Sexually transmitted disease?: No Injury to kidneys or bladder?: No Painful intercourse?: No Weak stream?: No Currently pregnant?: No Vaginal bleeding?: No Last menstrual period?: n  Gastrointestinal Nausea?: No Vomiting?: No Indigestion/heartburn?: No Diarrhea?: No Constipation?: No  Constitutional Fever: No Night sweats?: No Weight loss?: No Fatigue?: No  Skin Skin rash/lesions?: No Itching?: No  Eyes Blurred vision?: No Double vision?: No  Ears/Nose/Throat Sore throat?: No Sinus problems?: No  Hematologic/Lymphatic Swollen glands?: No Easy bruising?: Yes  Cardiovascular Leg swelling?: No Chest pain?: No  Respiratory Cough?: No Shortness of breath?: No  Endocrine Excessive thirst?: Yes  Musculoskeletal Back pain?: Yes Joint pain?: No  Neurological Headaches?: Yes Dizziness?: No  Psychologic Depression?: No Anxiety?: No  Physical Exam: Blood pressure 123/75, pulse 85, height 5' 2"  (1.575 m), weight 157 lb 8 oz (71.442 kg).  GU: No CVA tenderness. Atrophic external genitalia.  Normal urethral meatus. No urethral masses and/or tenderness. No bladder fullness or masses. No vaginal lesions or discharge. Normal rectal tone, no masses. Normal anus and perineum.   Laboratory Data:  Urinalysis: Results for orders placed or performed in visit on 05/18/15  Microscopic Examination  Result Value Ref Range   WBC, UA  0-5 0 -  5 /hpf   RBC, UA None seen 0 -  2 /hpf   Epithelial Cells (non renal) 0-10 0 - 10 /hpf   Renal Epithel, UA 0-10 (A) None seen /hpf   Bacteria, UA None seen None seen/Few  Urinalysis, Complete  Result Value Ref Range   Specific Gravity, UA 1.010 1.005 - 1.030   pH, UA 5.5 5.0 - 7.5   Color, UA Yellow Yellow   Appearance Ur Clear Clear   Leukocytes, UA 1+ (A) Negative   Protein, UA Negative Negative/Trace   Glucose, UA 3+ (A) Negative   Ketones, UA Negative Negative   RBC, UA Negative Negative   Bilirubin, UA Negative Negative   Urobilinogen, Ur 0.2 0.2 - 1.0 mg/dL   Nitrite, UA  Negative Negative   Microscopic Examination See below:     Pertinent Imaging: Results for JAZLINE, CUMBEE (MRN [id number]) as of 04/25/2015 15:47  Ref. Range 04/25/2015 15:38  Scan Result Unknown 104    Assessment & Plan:   1. Recurrent UTI:   Patient's UA today is unremarkable.   She is currently not experiencing symptoms of urinary tract infection.    - Urinalysis, Complete   2. Atrophic vaginitis:   Patient will continue the vaginal estrogen cream on Monday nights, Wednesday nights and Friday nights.  Her insurance will not cover the Premarin or the Estrace cream, so I gave her samples of the Premarin cream.  I will also call into a compounding pharmacy to see if this is a financially feasible.  She will RTC in 3 months for a vaginal exam.   3. Dyspareunia:   Patient's dyspareunia is improving.  She will continue the vaginal estrogen cream.    Return in about 3 months (around 08/18/2015) for exam.  Zara Council, [facility]  [place] [address] [place], [place] [address] [phone]

## 2015-05-19 NOTE — Telephone Encounter (Signed)
Vaginal cream called into [facility].

## 2015-05-22 ENCOUNTER — Emergency Department
Admission: EM | Admit: 2015-05-22 | Discharge: 2015-05-22 | Disposition: A | Discharge status: Home / Self Care | Payer: 59 | Attending: Emergency Medicine | Admitting: Emergency Medicine

## 2015-05-22 ENCOUNTER — Emergency Department: Payer: 59

## 2015-05-22 DIAGNOSIS — K5900 Constipation, unspecified: Secondary | ICD-10-CM | POA: Diagnosis not present

## 2015-05-22 DIAGNOSIS — Z79899 Other long term (current) drug therapy: Secondary | ICD-10-CM | POA: Insufficient documentation

## 2015-05-22 DIAGNOSIS — R109 Unspecified abdominal pain: Secondary | ICD-10-CM

## 2015-05-22 DIAGNOSIS — N390 Urinary tract infection, site not specified: Secondary | ICD-10-CM | POA: Diagnosis not present

## 2015-05-22 DIAGNOSIS — Z792 Long term (current) use of antibiotics: Secondary | ICD-10-CM | POA: Diagnosis not present

## 2015-05-22 DIAGNOSIS — Z87891 Personal history of nicotine dependence: Secondary | ICD-10-CM | POA: Insufficient documentation

## 2015-05-22 DIAGNOSIS — E119 Type 2 diabetes mellitus without complications: Secondary | ICD-10-CM | POA: Diagnosis not present

## 2015-05-22 LAB — URINALYSIS COMPLETE WITH MICROSCOPIC (ARMC ONLY)
Bilirubin Urine: NEGATIVE
Glucose, UA: NEGATIVE mg/dL
Ketones, ur: NEGATIVE mg/dL
Nitrite: NEGATIVE
PROTEIN: NEGATIVE mg/dL
Specific Gravity, Urine: 1.004 — ABNORMAL LOW (ref 1.005–1.030)
pH: 6 (ref 5.0–8.0)

## 2015-05-22 LAB — CBC WITH DIFFERENTIAL/PLATELET
BASOS ABS: 0 10*3/uL (ref 0–0.1)
Basophils Relative: 0 %
EOS PCT: 0 %
Eosinophils Absolute: 0 10*3/uL (ref 0–0.7)
HEMATOCRIT: 36.4 % (ref 35.0–47.0)
Hemoglobin: 12.3 g/dL (ref 12.0–16.0)
LYMPHS PCT: 32 %
Lymphs Abs: 1.5 10*3/uL (ref 1.0–3.6)
MCH: 29.5 pg (ref 26.0–34.0)
MCHC: 33.8 g/dL (ref 32.0–36.0)
MCV: 87.2 fL (ref 80.0–100.0)
Monocytes Absolute: 0.3 10*3/uL (ref 0.2–0.9)
Monocytes Relative: 6 %
NEUTROS ABS: 2.9 10*3/uL (ref 1.4–6.5)
Neutrophils Relative %: 62 %
PLATELETS: 271 10*3/uL (ref 150–440)
RBC: 4.18 MIL/uL (ref 3.80–5.20)
RDW: 12.9 % (ref 11.5–14.5)
WBC: 4.7 10*3/uL (ref 3.6–11.0)

## 2015-05-22 LAB — COMPREHENSIVE METABOLIC PANEL
ALT: 27 U/L (ref 14–54)
AST: 26 U/L (ref 15–41)
Albumin: 3.7 g/dL (ref 3.5–5.0)
Alkaline Phosphatase: 132 U/L — ABNORMAL HIGH (ref 38–126)
Anion gap: 7 (ref 5–15)
BUN: 7 mg/dL (ref 6–20)
CALCIUM: 8.9 mg/dL (ref 8.9–10.3)
CO2: 24 mmol/L (ref 22–32)
Chloride: 108 mmol/L (ref 101–111)
Creatinine, Ser: 0.68 mg/dL (ref 0.44–1.00)
GFR calc Af Amer: >60 mL/min (ref >60)
Glucose, Bld: 149 mg/dL — ABNORMAL HIGH (ref 65–99)
POTASSIUM: 3.6 mmol/L (ref 3.5–5.1)
Sodium: 139 mmol/L (ref 135–145)
TOTAL PROTEIN: 6.9 g/dL (ref 6.5–8.1)
Total Bilirubin: 0.1 mg/dL — ABNORMAL LOW (ref 0.3–1.2)

## 2015-05-22 LAB — LIPASE, BLOOD: Lipase: 20 U/L (ref 11–51)

## 2015-05-22 IMAGING — CT CT RENAL STONE PROTOCOL
1 of 2 series · 15 of 32 positions shown, 19 images · non-contrast
Comparison: [DATE]

CLINICAL DATA: Recurrent UTIs, left flank pain/tenderness

EXAM:
CT ABDOMEN AND PELVIS WITHOUT CONTRAST
TECHNIQUE: Multidetector CT imaging of the abdomen and pelvis was performed
following the standard protocol without IV contrast.

[Series 2: stone standard full · axial · 0.71mm/px · z∈[-940,-544]mm · 15 of 87 slices shown, 19 images]
[im 4/87  soft-tissue]
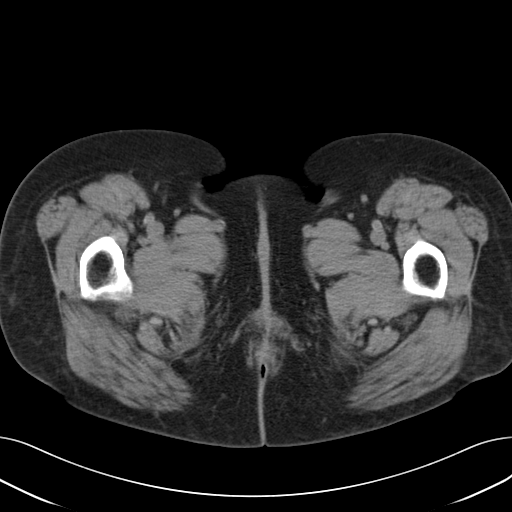
[im 4/87  bone]
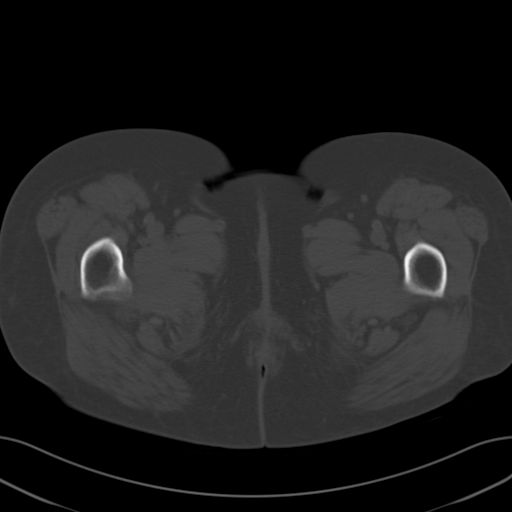
[im 11/87  soft-tissue]
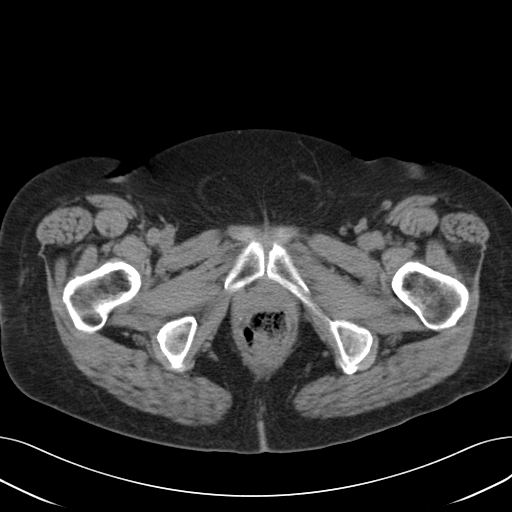
[im 18/87  soft-tissue]
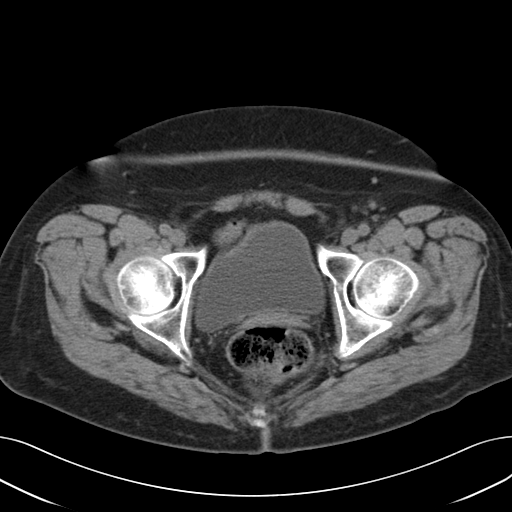
[im 26/87  soft-tissue]
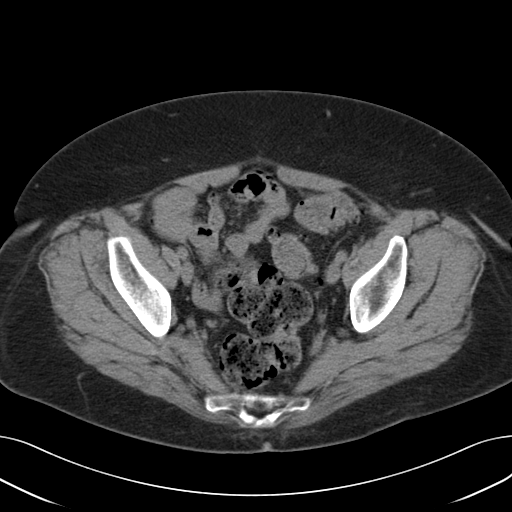
[im 29/87  soft-tissue]
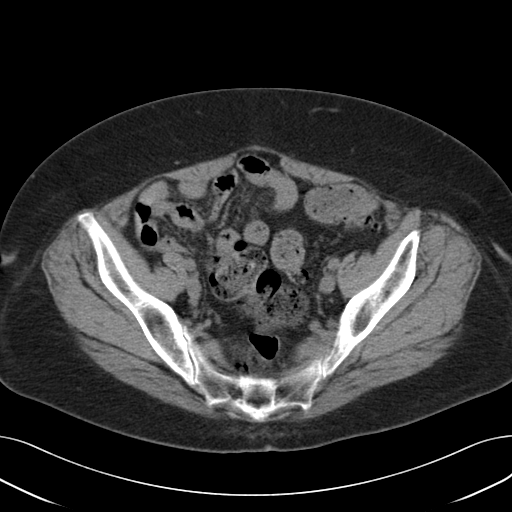
[im 36/87  soft-tissue]
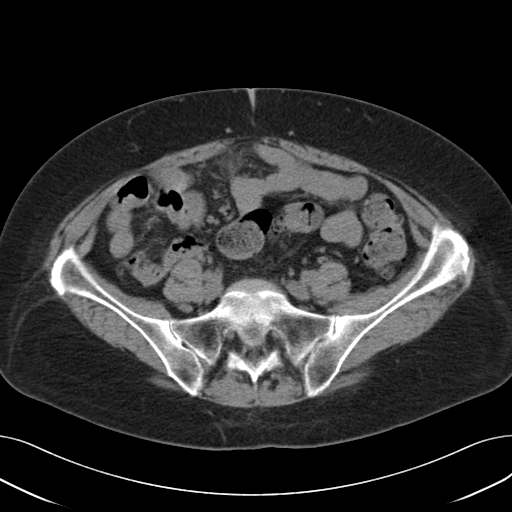
[im 44/87  soft-tissue]
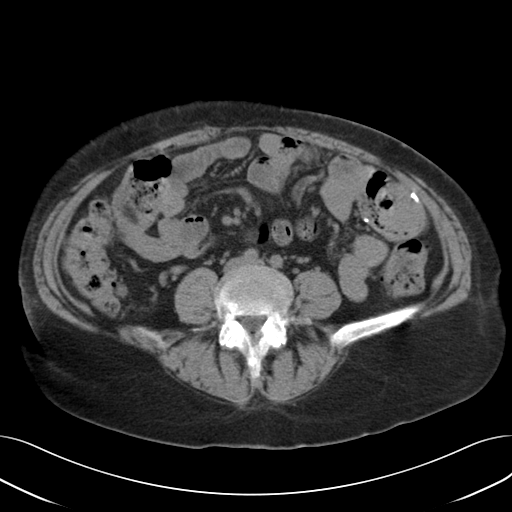
[im 51/87  soft-tissue]
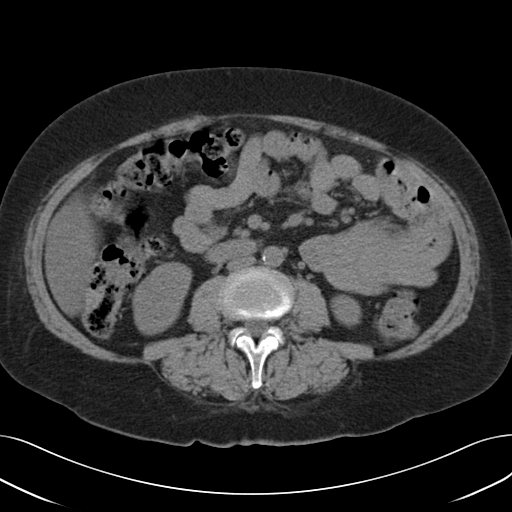
[im 58/87  soft-tissue]
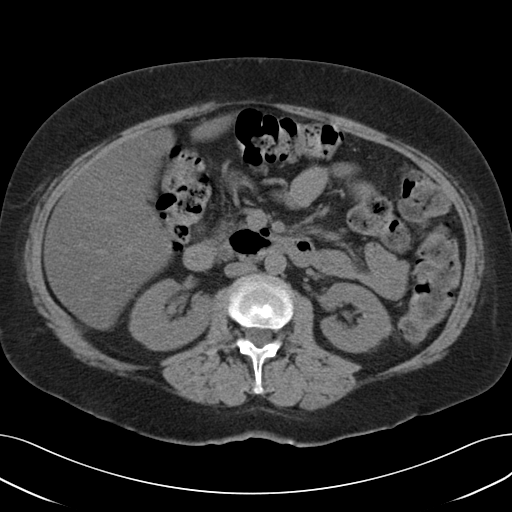
[im 58/87  bone]
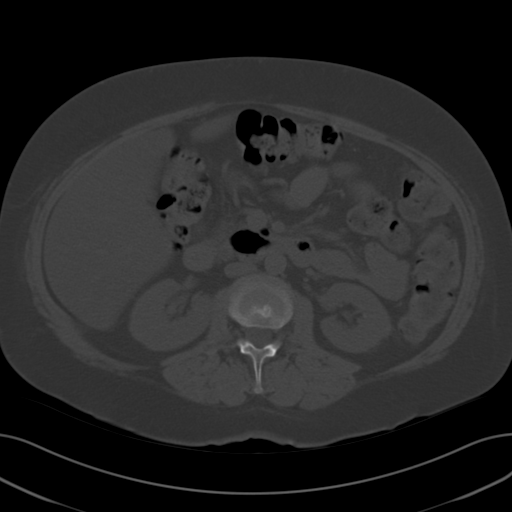
[im 61/87  soft-tissue]
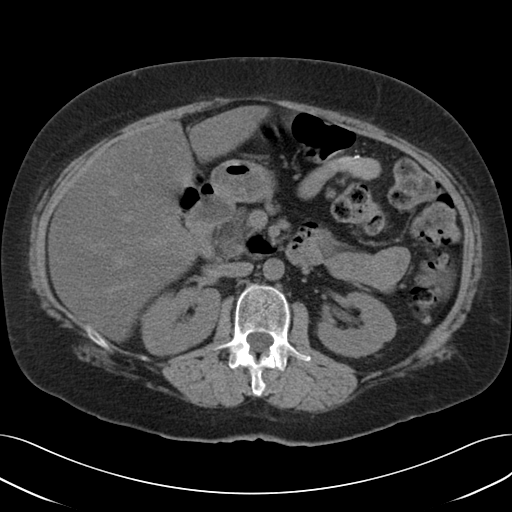
[im 69/87  soft-tissue]
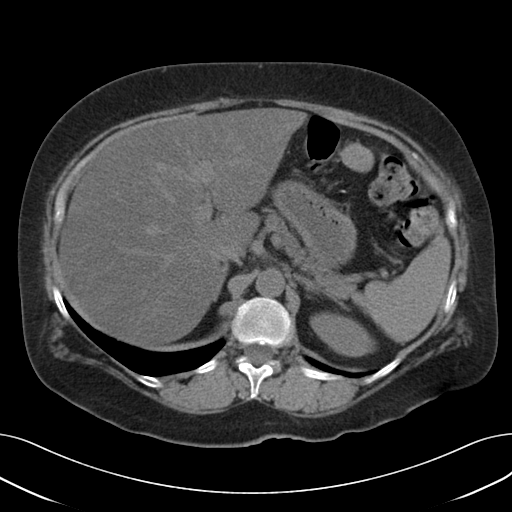
[im 72/87  lung]
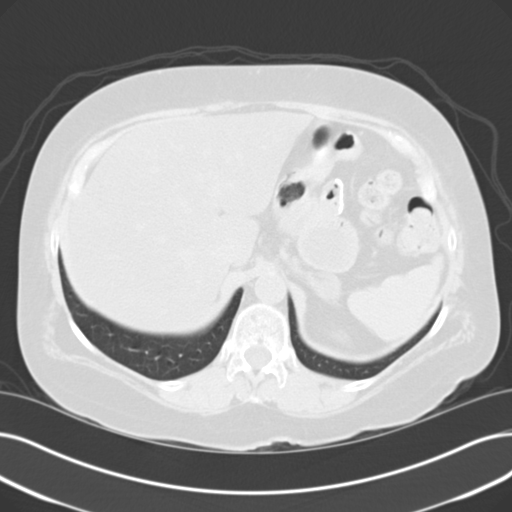
[im 76/87  soft-tissue]
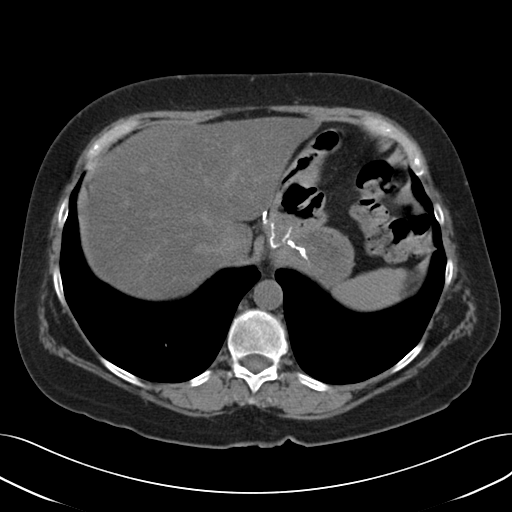
[im 76/87  lung]
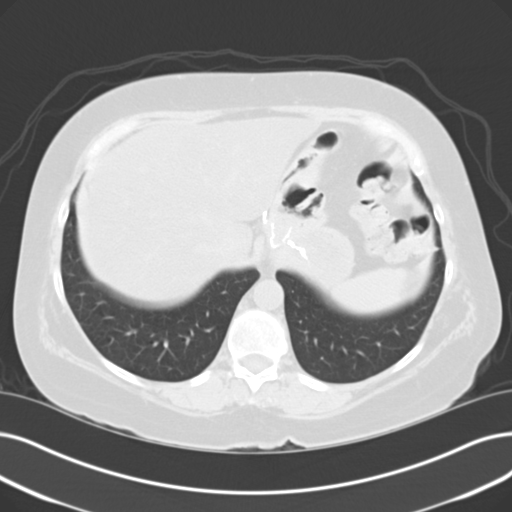
[im 79/87  lung]
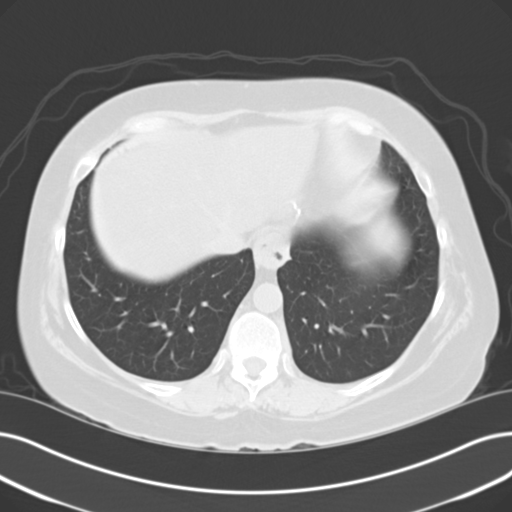
[im 83/87  soft-tissue]
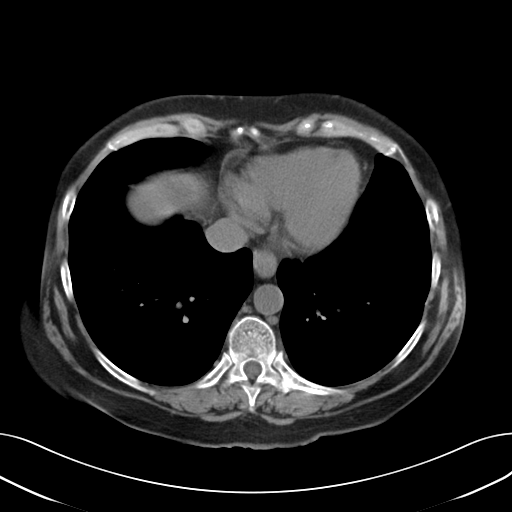
[im 83/87  lung]
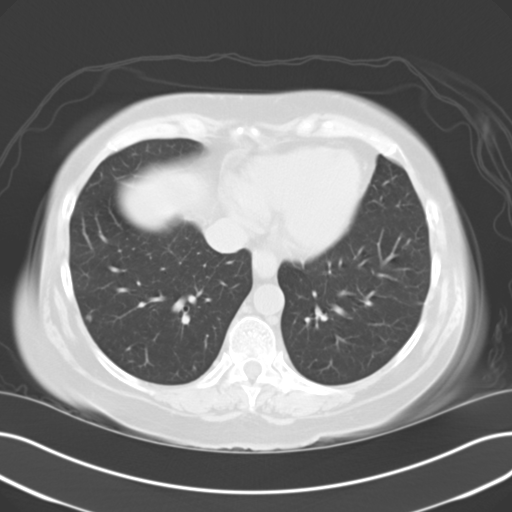

[15 of 32 positions shown; findings below may reference images not displayed]

FINDINGS: Lower chest:  Lung bases are clear.

Hepatobiliary: Moderate to severe hepatic steatosis. Suspected focal
fat along the lateral segment left hepatic lobe (series 2/image 15).

Status post cholecystectomy. No intrahepatic or extrahepatic ductal
dilatation.

Pancreas: Within normal limits.

Spleen: Within normal limits.

Adrenals/Urinary Tract: Adrenal glands are within normal limits.

Kidneys are within normal limits.

No renal, ureteral, or bladder calculi.  No hydronephrosis.

Bladder is unremarkable.

Stomach/Bowel: Stomach is notable for a small hiatal hernia and
postsurgical changes related to gastric bypass.

No evidence of bowel obstruction.

Normal appendix (series 2/image 51).

Colonic diverticulosis, without evidence of diverticulitis.

Moderate colonic stool burden.

Vascular/Lymphatic: No evidence of abdominal aortic aneurysm.

No suspicious abdominopelvic lymphadenopathy.

Reproductive: Uterus is unremarkable.

No adnexal masses.

Other: No abdominopelvic ascites.

Musculoskeletal: Mild degenerative changes of the visualized
thoracolumbar spine.
IMPRESSION: No renal, ureteral, or bladder calculi.  No hydronephrosis.

No evidence of bowel obstruction.  Normal appendix.

Colonic diverticulosis, without evidence of diverticulitis.

Moderate colonic stool burden, suggesting constipation.

## 2015-05-22 MED ORDER — SODIUM CHLORIDE 0.9 % IV BOLUS (SEPSIS)
500.0000 mL | Freq: Once | INTRAVENOUS | Status: AC
  Administered 2015-05-22: 500 mL via INTRAVENOUS

## 2015-05-22 MED ORDER — CEPHALEXIN 500 MG PO CAPS: 500.0000 mg | ORAL_CAPSULE | Freq: Four times a day (QID) | ORAL | Status: AC

## 2015-05-22 MED ORDER — DEXTROSE 5 % IV SOLN
1.0000 g | Freq: Once | INTRAVENOUS | Status: AC
  Administered 2015-05-22: 1 g via INTRAVENOUS
  Filled 2015-05-22: qty 10

## 2015-05-22 MED ORDER — FENTANYL CITRATE (PF) 100 MCG/2ML IJ SOLN
50.0000 ug | Freq: Once | INTRAMUSCULAR | Status: AC
  Administered 2015-05-22: 50 ug via INTRAVENOUS
  Filled 2015-05-22: qty 2

## 2015-05-22 NOTE — ED Provider Notes (Addendum)
[facility] Emergency Department Provider Note  ____________________________________________   I have reviewed the triage vital signs and the nursing notes.   HISTORY  Chief Complaint Flank Pain    HPI Amy Hansen is a 59 y.o. female presents today complaining of flank pain on the left, had a little bit of flank pain on the right as well. Began in the middle the night. History of recurrent urinary tract infections has been on urinary tract antibiotics off and on for last 3 months. Does have a urologist for this. Has never had a cystoscopy. States that she does have cystitis recurrently. She has had a little bit of hematuria. She was not suffering from significant amount of dysuria however prior to the advent of this pain last night. She has not vomited but felt nauseated. She has not had flank pain like this before. History of gastric bypass remotely otherwise nonsurgical, no right lower quadrant pain. Also complaining of suprapubic discomfort.  Past Medical History  Diagnosis Date  . Anxiety   . Depression   . Diabetes ([place])   . Frequent headaches   . Recurrent UTI   . Urinary frequency     Patient Active Problem List   Diagnosis Date Noted  . Recurrent UTI 04/26/2015  . Atrophic vaginitis 04/26/2015  . Dyspareunia, female 04/26/2015  . Frequent UTI 04/25/2015    Past Surgical History  Procedure Laterality Date  . Cholecystectomy  1975  . Hemorrhoid surgery  2013  . Gastric bypass  2010  . Carpal tunnel release Right 2003  . Knee arthroscopy Left 1996  . Abdominal hysterectomy      Current Outpatient Rx  Name  Route  Sig  Dispense  Refill  . ALPRAZolam (XANAX) 0.5 MG tablet   Oral   Take 0.5 mg by mouth at bedtime as needed for anxiety.         [name] buPROPion (WELLBUTRIN XL) 150 MG 24 hr tablet   Oral   Take by mouth.         . estrogens, conjugated, (PREMARIN) 0.625 MG tablet   Oral   Take 0.625 mg by mouth daily. Take daily for 21  days then do not take for 7 days.         . Eszopiclone 3 MG TABS   Oral   Take by mouth.         . fluconazole (DIFLUCAN) 100 MG tablet   Oral   Take 1 tablet (100 mg total) by mouth daily. X 7 days Patient not taking: Reported on 05/18/2015   7 tablet   0   . fluticasone (FLONASE) 50 MCG/ACT nasal spray   Nasal   Place into the nose.         . lamoTRIgine (LAMICTAL) 150 MG tablet   Oral   Take 150 mg by mouth daily.         . nitrofurantoin (MACRODANTIN) 50 MG capsule   Oral   Take by mouth.         . pramipexole (MIRAPEX) 0.5 MG tablet   Oral   Take 1 tablet (0.5 mg total) by mouth at bedtime.   90 tablet   3   . risperiDONE (RISPERDAL) 1 MG tablet   Oral   Take by mouth.         . zaleplon (SONATA) 10 MG capsule                 Allergies Avelox and Stadol  Family History  Problem Relation Age of Onset  . Stroke Father   . Bladder Cancer Neg Hx   . Kidney disease Neg Hx   . Prostate cancer Neg Hx     Social History Social History  Substance Use Topics  . Smoking status: Former Smoker    Quit date: 04/25/1975  . Smokeless tobacco: None     Comment: quit 40 years  . Alcohol Use: No    Review of Systems Constitutional: No fever/chills Eyes: No visual changes. ENT: No sore throat. No stiff neck no neck pain Cardiovascular: Denies chest pain. Respiratory: Denies shortness of breath. Gastrointestinal:   no vomiting.  No diarrhea.  No constipation. Genitourinary: See history of present illness Musculoskeletal: Negative lower extremity swelling Skin: Negative for rash. Neurological: Negative for headaches, focal weakness or numbness. 10-point ROS otherwise negative.  ____________________________________________   PHYSICAL EXAM:  VITAL SIGNS: ED Triage Vitals  Enc Vitals Group     BP 05/22/15 1413 162/97 mmHg     Pulse Rate 05/22/15 1413 71     Resp 05/22/15 1413 17     Temp 05/22/15 1413 97.8 F (36.6 C)     Temp Source  05/22/15 1413 Oral     SpO2 05/22/15 1413 99 %     Weight 05/22/15 1413 156 lb (70.761 kg)     Height 05/22/15 1413 5' 2"  (1.575 m)     Head Cir --      Peak Flow --      Pain Score 05/22/15 1417 5     Pain Loc --      Pain Edu? --      Excl. in [place]? --     Constitutional: Alert and oriented. Well appearing and in no acute distress. Appears uncomfortable but not toxic Eyes: Conjunctivae are normal. PERRL. EOMI. Head: Atraumatic. Nose: No congestion/rhinnorhea. Mouth/Throat: Mucous membranes are moist.  Oropharynx non-erythematous. Neck: No stridor.   Nontender with no meningismus Cardiovascular: Normal rate, regular rhythm. Grossly normal heart sounds.  Good peripheral circulation. Respiratory: Normal respiratory effort.  No retractions. Lungs CTAB. Gastrointestinal: Soft and superpubic tenderness noted. No distention. No guarding no rebound Back:  There is no focal tenderness or step off there is no midline tenderness there are no lesions noted. there is left CVA tenderness Musculoskeletal: No lower extremity tenderness. No joint effusions, no DVT signs strong distal pulses no edema Neurologic:  Normal speech and language. No gross focal neurologic deficits are appreciated.  Skin:  Skin is warm, dry and intact. No rash noted. Psychiatric: Mood and affect are normal. Speech and behavior are normal.  ____________________________________________   LABS (all labs ordered are listed, but only abnormal results are displayed)  Labs Reviewed  URINE CULTURE  URINALYSIS COMPLETEWITH MICROSCOPIC ([place])  CBC WITH DIFFERENTIAL/PLATELET  COMPREHENSIVE METABOLIC PANEL  LIPASE, BLOOD   ____________________________________________  EKG   ____________________________________________  RADIOLOGY  Personally reviewed ____________________________________________   PROCEDURES  Procedure(s) performed: None  Critical Care performed:  None  ____________________________________________   INITIAL IMPRESSION / ASSESSMENT AND PLAN / ED COURSE  Pertinent labs & imaging results that were available during my care of the patient were reviewed by me and considered in my medical decision making (see chart for details).  Patient with history of recurrent cystitis presents today with hematuria and flank pain. Certainly could be pyelonephritis, she is afebrile here, vital signs are reassuring we will give her IV fluid, pain medication I'll obtain a CT scan to make sure she does  not have an infected or otherwise complicated kidney stone. We'll obtain basic blood work including liver function tests and lipase given her history of gastric bypass and reassess ____________________________________________  ----------------------------------------- 4:22 PM on 05/22/2015 -----------------------------------------  Christoper Fabian reassuring exam serial exams show no progression of abdominal discomfort and affect at this time she is pain-free. CT is unremarkable blood work is reassuring, possibly a small urinary tract infection unclear. We will give her, pending cultures, antibiotics here and to go. Patient does not require admission and I do not believe. There is no evidence of ischemic or diverticulitis or any other acute intra-abdominal pathology today. I have extended patient that she is somewhat constipated which certainly could be causing her left-sided discomfort. Blood work is very reassuring vital signs are very reassuring. She could've passed a stone but there is no evidence of kidney stone on CT. There is no evidence of pyelonephritis either. We will discharge patient therefore with close follow-up with her urologist return precautions and urine culture pending   FINAL CLINICAL IMPRESSION(S) / ED DIAGNOSES  Final diagnoses:  Flank pain     Schuyler Amor, MD 05/22/15 [place], MD 05/22/15 1623

## 2015-05-22 NOTE — ED Notes (Signed)
Pt reports having reoccurrence UTI's. Pain and tenderness on palpitation on left flank and left lower abdomen.

## 2015-05-22 NOTE — Discharge Instructions (Signed)
Return to the emergency room if you feel worse in any way including fever, nausea, vomiting, diarrhea. Follow closely with your urologist.

## 2015-05-22 NOTE — ED Notes (Signed)
Pt c/o left flank pain with nausea since 4am..denies hx of kidney stones but states this feels like UTI she has had in the past.

## 2015-05-23 ENCOUNTER — Encounter: Payer: Self-pay | Admitting: Family Medicine

## 2015-05-23 ENCOUNTER — Ambulatory Visit (INDEPENDENT_AMBULATORY_CARE_PROVIDER_SITE_OTHER): Payer: 59 | Admitting: Family Medicine

## 2015-05-23 VITALS — BP 143/91 | HR 77 | Temp 98.0°F | Ht 60.6 in | Wt 153.0 lb

## 2015-05-23 DIAGNOSIS — G2581 Restless legs syndrome: Secondary | ICD-10-CM

## 2015-05-23 DIAGNOSIS — Z23 Encounter for immunization: Secondary | ICD-10-CM | POA: Diagnosis not present

## 2015-05-23 DIAGNOSIS — N390 Urinary tract infection, site not specified: Secondary | ICD-10-CM | POA: Diagnosis not present

## 2015-05-23 DIAGNOSIS — G43909 Migraine, unspecified, not intractable, without status migrainosus: Secondary | ICD-10-CM | POA: Diagnosis not present

## 2015-05-23 LAB — URINE CULTURE

## 2015-05-23 MED ORDER — TRAMADOL HCL 50 MG PO TABS: 50.0000 mg | ORAL_TABLET | Freq: Three times a day (TID) | ORAL | Status: DC | PRN

## 2015-05-23 MED ORDER — BUTALBITAL-APAP-CAFFEINE 50-325-40 MG PO TABS: 1.0000 | ORAL_TABLET | Freq: Four times a day (QID) | ORAL | Status: DC | PRN

## 2015-05-23 MED ORDER — PRAMIPEXOLE DIHYDROCHLORIDE 0.5 MG PO TABS: 0.5000 mg | ORAL_TABLET | Freq: Every day | ORAL | Status: DC

## 2015-05-23 NOTE — Assessment & Plan Note (Signed)
Take Keflex as prescribed by ER. Tramadol given for pain. ? Passed kidney stone. Follow up 1 week for recheck on pain.

## 2015-05-23 NOTE — Assessment & Plan Note (Signed)
Refill of fioricet given.

## 2015-05-23 NOTE — Progress Notes (Signed)
BP 143/91 mmHg  Pulse 77  Temp(Src) 98 F (36.7 C)  Ht 5' 0.6" (1.539 m)  Wt 153 lb (69.4 kg)  BMI 29.30 kg/m2  SpO2 97%   Subjective:    Patient ID: Amy Hansen, female    DOB: 10/08/1955, 59 y.o.   MRN: [id number]  HPI: Amy Hansen is a 59 y.o. female  Chief Complaint  Patient presents with  . ER Follow Up    UTI, Patient was sent to the ER yesterday by UC to rule out a kidney stone. She is here to follow up. She states that some of the pelvic pressure is better, but the back pain is still bad.   ER FOLLOW UP Time since discharge: 1 day Hospital/facility: [facility] Diagnosis: flank pain Procedures/tests: CT abdomen and pelvis Consultants: none New medications: rocephin IV  Discharge instructions:  Follow up here Status: stable  URINARY SYMPTOMS- flank pain Duration: 1 day Dysuria: burning Urinary frequency: yes Urgency: yes Small volume voids: no Symptom severity: moderate Urinary incontinence: no Foul odor: yes Hematuria: yes Abdominal pain: yes Back pain: yes Suprapubic pain/pressure: yes Flank pain: yes Fever:  subjective Vomiting: no- but got really nauseous with dry heaving Relief with cranberry juice: no Relief with pyridium: no Status: stable Previous urinary tract infection: yes Recurrent urinary tract infection: yes Vaginal discharge: no Treatments attempted: antibiotics, pyridium, cranberry and increasing fluids   Relevant past medical, surgical, family and social history reviewed and updated as indicated. Interim medical history since our last visit reviewed. Allergies and medications reviewed and updated.  Review of Systems  Constitutional: Negative.   Respiratory: Negative.   Cardiovascular: Negative.   Gastrointestinal: Negative.   Genitourinary: Negative.   Psychiatric/Behavioral: Negative.     Per HPI unless specifically indicated above     Objective:    BP 143/91 mmHg  Pulse 77  Temp(Src) 98 F (36.7 C)  Ht 5' 0.6"  (1.539 m)  Wt 153 lb (69.4 kg)  BMI 29.30 kg/m2  SpO2 97%  Wt Readings from Last 3 Encounters:  05/23/15 153 lb (69.4 kg)  05/22/15 156 lb (70.761 kg)  05/18/15 157 lb 8 oz (71.442 kg)    Physical Exam  Constitutional: She is oriented to person, place, and time. She appears well-developed and well-nourished. No distress.  HENT:  Head: Normocephalic and atraumatic.  Right Ear: Hearing normal.  Left Ear: Hearing normal.  Nose: Nose normal.  Eyes: Conjunctivae and lids are normal. Right eye exhibits no discharge. Left eye exhibits no discharge. No scleral icterus.  Cardiovascular: Normal rate, regular rhythm, normal heart sounds and intact distal pulses.  Exam reveals no gallop and no friction rub.   No murmur heard. Pulmonary/Chest: Effort normal and breath sounds normal. No respiratory distress. She has no wheezes. She has no rales. She exhibits no tenderness.  Abdominal: Soft. Bowel sounds are normal. She exhibits no distension and no mass. There is tenderness in the suprapubic area. There is CVA tenderness. There is no rebound and no guarding.  Musculoskeletal: Normal range of motion.  Neurological: She is alert and oriented to person, place, and time.  Skin: Skin is intact. No rash noted.  Psychiatric: She has a normal mood and affect. Her speech is normal and behavior is normal. Judgment and thought content normal. Cognition and memory are normal.  Nursing note and vitals reviewed.   Results for orders placed or performed during the hospital encounter of 05/22/15  Urine culture  Result Value Ref Range  Specimen Description URINE, CLEAN CATCH    Special Requests NONE    Culture INSIGNIFICANT GROWTH    Report Status 05/23/2015 FINAL   Urinalysis complete, with microscopic  Result Value Ref Range   Color, Urine STRAW (A) YELLOW   APPearance CLEAR (A) CLEAR   Glucose, UA NEGATIVE NEGATIVE mg/dL   Bilirubin Urine NEGATIVE NEGATIVE   Ketones, ur NEGATIVE NEGATIVE mg/dL    Specific Gravity, Urine 1.004 (L) 1.005 - 1.030   Hgb urine dipstick 1+ (A) NEGATIVE   pH 6.0 5.0 - 8.0   Protein, ur NEGATIVE NEGATIVE mg/dL   Nitrite NEGATIVE NEGATIVE   Leukocytes, UA TRACE (A) NEGATIVE   RBC / HPF 0-5 0 - 5 RBC/hpf   WBC, UA 6-30 0 - 5 WBC/hpf   Bacteria, UA RARE (A) NONE SEEN   Squamous Epithelial / LPF 0-5 (A) NONE SEEN  CBC with Differential  Result Value Ref Range   WBC 4.7 3.6 - 11.0 K/uL   RBC 4.18 3.80 - 5.20 MIL/uL   Hemoglobin 12.3 12.0 - 16.0 g/dL   HCT 36.4 35.0 - 47.0 %   MCV 87.2 80.0 - 100.0 fL   MCH 29.5 26.0 - 34.0 pg   MCHC 33.8 32.0 - 36.0 g/dL   RDW 12.9 11.5 - 14.5 %   Platelets 271 150 - 440 K/uL   Neutrophils Relative % 62 %   Neutro Abs 2.9 1.4 - 6.5 K/uL   Lymphocytes Relative 32 %   Lymphs Abs 1.5 1.0 - 3.6 K/uL   Monocytes Relative 6 %   Monocytes Absolute 0.3 0.2 - 0.9 K/uL   Eosinophils Relative 0 %   Eosinophils Absolute 0.0 0 - 0.7 K/uL   Basophils Relative 0 %   Basophils Absolute 0.0 0 - 0.1 K/uL  Comprehensive metabolic panel  Result Value Ref Range   Sodium 139 135 - 145 mmol/L   Potassium 3.6 3.5 - 5.1 mmol/L   Chloride 108 101 - 111 mmol/L   CO2 24 22 - 32 mmol/L   Glucose, Bld 149 (H) 65 - 99 mg/dL   BUN 7 6 - 20 mg/dL   Creatinine, Ser 0.68 0.44 - 1.00 mg/dL   Calcium 8.9 8.9 - 10.3 mg/dL   Total Protein 6.9 6.5 - 8.1 g/dL   Albumin 3.7 3.5 - 5.0 g/dL   AST 26 15 - 41 U/L   ALT 27 14 - 54 U/L   Alkaline Phosphatase 132 (H) 38 - 126 U/L   Total Bilirubin 0.1 (L) 0.3 - 1.2 mg/dL   GFR calc non Af Amer >60 >60 mL/min   GFR calc Af Amer >60 >60 mL/min   Anion gap 7 5 - 15  Lipase, blood  Result Value Ref Range   Lipase 20 11 - 51 U/L      Assessment & Plan:   Problem List Items Addressed This Visit      Cardiovascular and Mediastinum   Migraines    Refill of fioricet given.       Relevant Medications   traMADol (ULTRAM) 50 MG tablet   butalbital-acetaminophen-caffeine (FIORICET, ESGIC) 50-325-40  MG tablet     Genitourinary   Recurrent UTI    Take Keflex as prescribed by ER. Tramadol given for pain. ? Passed kidney stone. Follow up 1 week for recheck on pain.         Other   Restless legs syndrome (RLS)    Refill of mirapex given.  Other Visit Diagnoses    Immunization due    -  Primary    Relevant Orders    Flu Vaccine QUAD 36+ mos PF IM (Fluarix & Fluzone Quad PF) (Completed)     Records from the hospital and her most recent urology visit reviewed and discussed with patient. Lab results reviewed. Imaging reviewed and discussed. Previous notes reviewed.    Follow up plan: Return in about 1 week (around 05/30/2015) for follow up UTI with CW.

## 2015-05-23 NOTE — Assessment & Plan Note (Signed)
Refill of mirapex given.

## 2015-05-25 ENCOUNTER — Telehealth: Payer: Self-pay | Admitting: Urology

## 2015-05-25 NOTE — Telephone Encounter (Signed)
I spoke with the patient while she was drawing my blood from my TB test.  She is still having pelvic discomfort. She did have an episode of gross hematuria over the weekend. Her urine culture was negative. Her non contrast CT of the abdomen and pelvis were negative for stones.  She states she is still having significant amount of pelvic discomfort.  I suggested she undergo cystoscopic with bilateral retrogrades. The procedure and risks involved were explained to the patient. She understands and wishes to proceed. I told her that you will be contacting her to set this procedure.

## 2015-05-25 NOTE — Telephone Encounter (Signed)
LMOM

## 2015-05-25 NOTE — Telephone Encounter (Signed)
Urine culture from the ED was negative for infection.  Her CT scan was negative for stones.  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

## 2015-05-26 NOTE — Telephone Encounter (Signed)
LMOM

## 2015-05-26 NOTE — Telephone Encounter (Signed)
Pt notified of surgery scheduled 06/06/15, pre-admit appt on 05/30/15 @9 :00 and to call Friday prior to surgery for arrival time to [facility]. Pt advised to be npo after mn day of surgery. Pt voices understanding.

## 2015-05-30 ENCOUNTER — Encounter
Admission: RE | Admit: 2015-05-30 | Discharge: 2015-05-30 | Disposition: A | Discharge status: Home / Self Care | Payer: 59 | Source: Ambulatory Visit | Attending: Urology | Admitting: Urology

## 2015-05-30 ENCOUNTER — Other Ambulatory Visit: Payer: Self-pay | Admitting: Urology

## 2015-05-30 DIAGNOSIS — Z01818 Encounter for other preprocedural examination: Secondary | ICD-10-CM | POA: Diagnosis not present

## 2015-05-30 DIAGNOSIS — R310 Gross hematuria: Secondary | ICD-10-CM | POA: Diagnosis not present

## 2015-05-30 HISTORY — DX: Anemia, unspecified: D64.9

## 2015-05-30 HISTORY — DX: Diverticulosis of intestine, part unspecified, without perforation or abscess without bleeding: K57.90

## 2015-05-30 HISTORY — DX: Restless legs syndrome: G25.81

## 2015-05-30 HISTORY — DX: Chronic kidney disease, unspecified: N18.9

## 2015-05-30 NOTE — Patient Instructions (Signed)
  Your procedure is scheduled PI:[id number] 7, 2016 (Monday) Report to Day [facility]) To find out your arrival time please call [phone] between 1PM - 3PM on June 03, 2015 ([place]).  Remember: Instructions that are not followed completely may result in serious medical risk, up to and including death, or upon the discretion of your surgeon and anesthesiologist your surgery may need to be rescheduled.    __x__ 1. Do not eat food or drink liquids after midnight. No gum chewing or hard candies.     ____ 2. No Alcohol for 24 hours before or after surgery.   ____ 3. Bring all medications with you on the day of surgery if instructed.    _x___ 4. Notify your doctor if there is any change in your medical condition     (cold, fever, infections).     Do not wear jewelry, make-up, hairpins, clips or nail polish.  Do not wear lotions, powders, or perfumes. You may wear deodorant.  Do not shave 48 hours prior to surgery. Men may shave face and neck.  Do not bring valuables to the hospital.    [facility] is not responsible for any belongings or valuables.               Contacts, dentures or bridgework may not be worn into surgery.  Leave your suitcase in the car. After surgery it may be brought to your room.  For patients admitted to the hospital, discharge time is determined by your                treatment team.   Patients discharged the day of surgery will not be allowed to drive home.   Please read over the following fact sheets that you were given:      __x__ Take these medicines the morning of surgery with A SIP OF WATER:    1. Wellbutrin  2.   3.   4.  5.  6.  ____ Fleet Enema (as directed)   ____ Use CHG Soap as directed  ____ Use inhalers on the day of surgery  ____ Stop metformin 2 days prior to surgery    ____ Take 1/2 of usual insulin dose the night before surgery and none on the morning of surgery.   ____ Stop Coumadin/Plavix/aspirin on   __x__  Stop Anti-inflammatories on (Tylenol ok to take for pain if needed)   ____ Stop supplements until after surgery.    ____ Bring C-Pap to the hospital.

## 2015-05-31 ENCOUNTER — Ambulatory Visit: Payer: 59 | Admitting: Family Medicine

## 2015-05-31 LAB — BASIC METABOLIC PANEL
BUN/Creatinine Ratio: 8 — ABNORMAL LOW (ref 9–23)
BUN: 5 mg/dL — ABNORMAL LOW (ref 6–24)
CALCIUM: 8.7 mg/dL (ref 8.7–10.2)
CHLORIDE: 101 mmol/L (ref 97–106)
CO2: 20 mmol/L (ref 18–29)
Creatinine, Ser: 0.63 mg/dL (ref 0.57–1.00)
GFR calc Af Amer: 115 mL/min/{1.73_m2} (ref >59)
GFR, EST NON AFRICAN AMERICAN: 100 mL/min/{1.73_m2} (ref >59)
GLUCOSE: 171 mg/dL — AB (ref 65–99)
POTASSIUM: 4 mmol/L (ref 3.5–5.2)
SODIUM: 139 mmol/L (ref 136–144)

## 2015-05-31 LAB — CBC, NO DIFFERENTIAL/PLATELET
HEMOGLOBIN: 11.7 g/dL (ref 11.1–15.9)
Hematocrit: 36.6 % (ref 34.0–46.6)
MCH: 28 pg (ref 26.6–33.0)
MCHC: 32 g/dL (ref 31.5–35.7)
MCV: 88 fL (ref 79–97)
RBC: 4.18 x10E6/uL (ref 3.77–5.28)
RDW: 13.1 % (ref 12.3–15.4)
WBC: 4.6 10*3/uL (ref 3.4–10.8)

## 2015-05-31 LAB — URINE CULTURE: Organism ID, Bacteria: NO GROWTH

## 2015-06-06 ENCOUNTER — Encounter
Admission: RE | Disposition: A | Discharge status: Home / Self Care | Payer: Self-pay | Source: Ambulatory Visit | Attending: Urology

## 2015-06-06 ENCOUNTER — Ambulatory Visit: Payer: 59 | Admitting: *Deleted

## 2015-06-06 ENCOUNTER — Ambulatory Visit
Admission: RE | Admit: 2015-06-06 | Discharge: 2015-06-06 | Disposition: A | Discharge status: Home / Self Care | Payer: 59 | Source: Ambulatory Visit | Attending: Urology | Admitting: Urology

## 2015-06-06 ENCOUNTER — Encounter: Payer: Self-pay | Admitting: *Deleted

## 2015-06-06 DIAGNOSIS — Z87891 Personal history of nicotine dependence: Secondary | ICD-10-CM | POA: Diagnosis not present

## 2015-06-06 DIAGNOSIS — R102 Pelvic and perineal pain: Secondary | ICD-10-CM | POA: Insufficient documentation

## 2015-06-06 DIAGNOSIS — Z888 Allergy status to other drugs, medicaments and biological substances status: Secondary | ICD-10-CM | POA: Insufficient documentation

## 2015-06-06 DIAGNOSIS — Z9049 Acquired absence of other specified parts of digestive tract: Secondary | ICD-10-CM | POA: Diagnosis not present

## 2015-06-06 DIAGNOSIS — F419 Anxiety disorder, unspecified: Secondary | ICD-10-CM | POA: Diagnosis not present

## 2015-06-06 DIAGNOSIS — Z87440 Personal history of urinary (tract) infections: Secondary | ICD-10-CM | POA: Diagnosis not present

## 2015-06-06 DIAGNOSIS — Z9884 Bariatric surgery status: Secondary | ICD-10-CM | POA: Insufficient documentation

## 2015-06-06 DIAGNOSIS — Z90710 Acquired absence of both cervix and uterus: Secondary | ICD-10-CM | POA: Diagnosis not present

## 2015-06-06 DIAGNOSIS — F329 Major depressive disorder, single episode, unspecified: Secondary | ICD-10-CM | POA: Diagnosis not present

## 2015-06-06 DIAGNOSIS — Z9889 Other specified postprocedural states: Secondary | ICD-10-CM | POA: Insufficient documentation

## 2015-06-06 DIAGNOSIS — R350 Frequency of micturition: Secondary | ICD-10-CM | POA: Insufficient documentation

## 2015-06-06 DIAGNOSIS — R309 Painful micturition, unspecified: Secondary | ICD-10-CM | POA: Diagnosis not present

## 2015-06-06 DIAGNOSIS — Z823 Family history of stroke: Secondary | ICD-10-CM | POA: Insufficient documentation

## 2015-06-06 DIAGNOSIS — G2581 Restless legs syndrome: Secondary | ICD-10-CM | POA: Diagnosis not present

## 2015-06-06 DIAGNOSIS — E119 Type 2 diabetes mellitus without complications: Secondary | ICD-10-CM | POA: Diagnosis not present

## 2015-06-06 DIAGNOSIS — Z79899 Other long term (current) drug therapy: Secondary | ICD-10-CM | POA: Insufficient documentation

## 2015-06-06 DIAGNOSIS — D649 Anemia, unspecified: Secondary | ICD-10-CM | POA: Insufficient documentation

## 2015-06-06 HISTORY — PX: CYSTOSCOPY W/ RETROGRADES: SHX1426

## 2015-06-06 LAB — GLUCOSE, CAPILLARY
Glucose-Capillary: 134 mg/dL — ABNORMAL HIGH (ref 65–99)
Glucose-Capillary: 129 mg/dL — ABNORMAL HIGH (ref 65–99)

## 2015-06-06 SURGERY — CYSTOSCOPY, WITH RETROGRADE PYELOGRAM
Anesthesia: General | Laterality: Bilateral | Wound class: Clean Contaminated

## 2015-06-06 MED ORDER — SCOPOLAMINE 1 MG/3DAYS TD PT72
MEDICATED_PATCH | TRANSDERMAL | Status: AC
  Administered 2015-06-06: 1.5 mg via TRANSDERMAL
  Filled 2015-06-06: qty 1

## 2015-06-06 MED ORDER — BUPIVACAINE HCL (PF) 0.5 % IJ SOLN
INTRAMUSCULAR | Status: AC
  Filled 2015-06-06: qty 30

## 2015-06-06 MED ORDER — ONDANSETRON HCL 4 MG/2ML IJ SOLN
4.0000 mg | Freq: Once | INTRAMUSCULAR | Status: AC | PRN
  Administered 2015-06-06: 4 mg via INTRAVENOUS

## 2015-06-06 MED ORDER — FENTANYL CITRATE (PF) 100 MCG/2ML IJ SOLN
INTRAMUSCULAR | Status: DC | PRN
  Administered 2015-06-06 (×2): 50 ug via INTRAVENOUS

## 2015-06-06 MED ORDER — METOCLOPRAMIDE HCL 5 MG/ML IJ SOLN
INTRAMUSCULAR | Status: DC | PRN
  Administered 2015-06-06: 5 mg via INTRAVENOUS

## 2015-06-06 MED ORDER — SODIUM CHLORIDE 0.9 % IV SOLN
INTRAVENOUS | Status: DC
  Administered 2015-06-06: 13:00:00 via INTRAVENOUS

## 2015-06-06 MED ORDER — FENTANYL CITRATE (PF) 100 MCG/2ML IJ SOLN
25.0000 ug | INTRAMUSCULAR | Status: DC | PRN
  Administered 2015-06-06: 50 ug via INTRAVENOUS

## 2015-06-06 MED ORDER — MIDAZOLAM HCL 2 MG/2ML IJ SOLN
INTRAMUSCULAR | Status: DC | PRN
  Administered 2015-06-06: 2 mg via INTRAVENOUS

## 2015-06-06 MED ORDER — GLYCOPYRROLATE 0.2 MG/ML IJ SOLN
INTRAMUSCULAR | Status: DC | PRN
  Administered 2015-06-06: .2 mg via INTRAVENOUS

## 2015-06-06 MED ORDER — SCOPOLAMINE 1 MG/3DAYS TD PT72
1.0000 | MEDICATED_PATCH | TRANSDERMAL | Status: DC
  Administered 2015-06-06: 1.5 mg via TRANSDERMAL

## 2015-06-06 MED ORDER — ONDANSETRON HCL 4 MG/2ML IJ SOLN
INTRAMUSCULAR | Status: AC
  Administered 2015-06-06: 4 mg via INTRAVENOUS
  Filled 2015-06-06: qty 2

## 2015-06-06 MED ORDER — CEFAZOLIN SODIUM-DEXTROSE 2-3 GM-% IV SOLR
INTRAVENOUS | Status: AC
  Filled 2015-06-06: qty 50

## 2015-06-06 MED ORDER — LIDOCAINE HCL (CARDIAC) 20 MG/ML IV SOLN
INTRAVENOUS | Status: DC | PRN
  Administered 2015-06-06: 80 mg via INTRAVENOUS

## 2015-06-06 MED ORDER — EPHEDRINE SULFATE 50 MG/ML IJ SOLN
INTRAMUSCULAR | Status: DC | PRN
  Administered 2015-06-06: 5 mg via INTRAVENOUS

## 2015-06-06 MED ORDER — PROMETHAZINE HCL 25 MG/ML IJ SOLN
INTRAMUSCULAR | Status: AC
  Filled 2015-06-06: qty 1

## 2015-06-06 MED ORDER — CEFAZOLIN SODIUM-DEXTROSE 2-3 GM-% IV SOLR
2.0000 g | Freq: Once | INTRAVENOUS | Status: AC
  Administered 2015-06-06: 2 g via INTRAVENOUS

## 2015-06-06 MED ORDER — PROPOFOL 10 MG/ML IV BOLUS
INTRAVENOUS | Status: DC | PRN
  Administered 2015-06-06: 150 mg via INTRAVENOUS

## 2015-06-06 MED ORDER — MEPERIDINE HCL 25 MG/ML IJ SOLN
INTRAMUSCULAR | Status: AC
  Filled 2015-06-06: qty 1

## 2015-06-06 MED ORDER — FAMOTIDINE 20 MG PO TABS
ORAL_TABLET | ORAL | Status: AC
  Filled 2015-06-06: qty 1

## 2015-06-06 MED ORDER — PROMETHAZINE HCL 25 MG/ML IJ SOLN
6.2500 mg | Freq: Once | INTRAMUSCULAR | Status: AC
  Administered 2015-06-06: 6.25 mg via INTRAVENOUS

## 2015-06-06 MED ORDER — FENTANYL CITRATE (PF) 100 MCG/2ML IJ SOLN
INTRAMUSCULAR | Status: AC
  Administered 2015-06-06: 50 ug via INTRAVENOUS
  Filled 2015-06-06: qty 2

## 2015-06-06 MED ORDER — PHENYLEPHRINE HCL 10 MG/ML IJ SOLN
INTRAMUSCULAR | Status: DC | PRN
  Administered 2015-06-06: 100 ug via INTRAVENOUS

## 2015-06-06 MED ORDER — IOTHALAMATE MEGLUMINE 43 % IV SOLN
INTRAVENOUS | Status: DC | PRN
  Administered 2015-06-06: 25 mL

## 2015-06-06 MED ORDER — ONDANSETRON HCL 4 MG/2ML IJ SOLN
INTRAMUSCULAR | Status: DC | PRN
  Administered 2015-06-06: 4 mg via INTRAVENOUS

## 2015-06-06 MED ORDER — BUPIVACAINE HCL 0.5 % IJ SOLN
INTRAMUSCULAR | Status: DC | PRN
  Administered 2015-06-06: 30 mL

## 2015-06-06 MED ORDER — MEPERIDINE HCL 25 MG/ML IJ SOLN
25.0000 mg | Freq: Once | INTRAMUSCULAR | Status: AC
  Administered 2015-06-06: 25 mg via INTRAVENOUS

## 2015-06-06 SURGICAL SUPPLY — 41 items
BAG DRAIN CYSTO-URO LG1000N (MISCELLANEOUS) ×1 IMPLANT
CATH FOL LX CONE TIP  8F (CATHETERS) ×1
CATH FOL LX CONE TIP 8F (CATHETERS) ×2 IMPLANT
CATH ROBINSON RED A/P 20FR (CATHETERS) IMPLANT
CATH URETL 5X70 OPEN END (CATHETERS) ×3 IMPLANT
CATH URETL OPEN END 6X70 (CATHETERS) ×3 IMPLANT
CONRAY 43 FOR UROLOGY 50M (MISCELLANEOUS) ×3 IMPLANT
DRAPE XRAY CASSETTE 23X24 (DRAPES) ×3 IMPLANT
ELECT BUTTON HF 24-28F 2 30DE (ELECTRODE) IMPLANT
ELECT LOOP MED HF 24F 12D (CUTTING LOOP) IMPLANT
GLOVE BIO SURGEON STRL SZ7 (GLOVE) ×3 IMPLANT
GLOVE BIO SURGEON STRL SZ7.5 (GLOVE) ×3 IMPLANT
GLOVE BIO SURGEON STRL SZ8 (GLOVE) ×3 IMPLANT
GOWN STRL REUS W/ TWL LRG LVL3 (GOWN DISPOSABLE) ×2 IMPLANT
GOWN STRL REUS W/ TWL LRG LVL4 (GOWN DISPOSABLE) ×2 IMPLANT
GOWN STRL REUS W/ TWL XL LVL3 (GOWN DISPOSABLE) ×2 IMPLANT
GOWN STRL REUS W/TWL LRG LVL3 (GOWN DISPOSABLE) ×3
GOWN STRL REUS W/TWL LRG LVL4 (GOWN DISPOSABLE) ×3
GOWN STRL REUS W/TWL XL LVL3 (GOWN DISPOSABLE) ×3
GUIDEWIRE STR ZIPWIRE 035X150 (MISCELLANEOUS) ×3 IMPLANT
NDL SAFETY 18GX1.5 (NEEDLE) IMPLANT
NS IRRIG 500ML POUR BTL (IV SOLUTION) ×3 IMPLANT
PACK CYSTO AR (MISCELLANEOUS) ×3 IMPLANT
PAD GROUND ADULT SPLIT (MISCELLANEOUS) ×1 IMPLANT
PLUG CATH AND CAP STER (CATHETERS) IMPLANT
PREP PVP WINGED SPONGE (MISCELLANEOUS) ×3 IMPLANT
SET CYSTO W/LG BORE CLAMP LF (SET/KITS/TRAYS/PACK) ×3 IMPLANT
SET IRRIG Y TYPE TUR BLADDER L (SET/KITS/TRAYS/PACK) ×3 IMPLANT
SOL .9 NS 3000ML IRR  AL (IV SOLUTION) ×1
SOL .9 NS 3000ML IRR AL (IV SOLUTION) ×2
SOL .9 NS 3000ML IRR UROMATIC (IV SOLUTION) ×2 IMPLANT
SOL PREP PVP 2OZ (MISCELLANEOUS) ×3
SOLUTION PREP PVP 2OZ (MISCELLANEOUS) ×2 IMPLANT
STENT URET 6FRX24 CONTOUR (STENTS) ×3 IMPLANT
STENT URET 6FRX26 CONTOUR (STENTS) ×3 IMPLANT
SURGILUBE 2OZ TUBE FLIPTOP (MISCELLANEOUS) ×3 IMPLANT
SYR 50ML LL SCALE MARK (SYRINGE) IMPLANT
SYRINGE IRR TOOMEY STRL 70CC (SYRINGE) ×3 IMPLANT
TUBING CONNECTING 10 (TUBING) ×3 IMPLANT
WATER STERILE IRR 1000ML POUR (IV SOLUTION) ×3 IMPLANT
WATER STERILE IRR 3000ML UROMA (IV SOLUTION) ×3 IMPLANT

## 2015-06-06 NOTE — Progress Notes (Signed)
In with pt at this time. Pt is complaining of a Migraine HA and reports that she gets them often. Pt rated Headache a 10 on 0-10 pain scale and Dr. Kayleen Memos was notified and orders for Demoral 25 mg was ordered and given. Will continue to monitor and tx pt according to MD orders. Husband and daughter are at the chairside.

## 2015-06-06 NOTE — Op Note (Signed)
Pre-op diagnosis: UTI symptoms, pelvic pain, flank pain  Post-op diagnosis: same  Procedure: Exam under anesthesia, cystoscopy, bilateral retrograde pyelogram  Surgeon: Junious Silk  Anesthesia: Gen.  Indication for procedure: 59 year old with history of UTI like symptoms, pelvic pain and flank pain. Noncontrast CT was normal. She was brought for exam under anesthesia, cystoscopy and bilateral retrograde pyelogram to complete the evaluation.  Findings: On exam under anesthesia the full flow appeared normal without mass or lesion. There was some mild atrophy present. Meatus appeared normal. On bimanual exam the urethra was palpably normal, the bladder was palpably normal. There were no vaginal masses palpated.  On cystoscopy the urethra appeared normal, the bladder was unremarkable. The trigone and ureteral orifices were normal with clear efflux. There were no stones or foreign bodies in the bladder. There were no tumors or other mucosal abnormalities. Bladder capacity seemed normal.  Right retrograde pyelogram-this outlined a single ureter single collecting system unit without filling defect, stricture or dilation. There was brisk drainage.  Left retrograde pyelogram-this outlined a single ureter single collecting system unit without filling defect, stricture or dilation. There was brisk drainage.  Description of procedure: After consent was obtained patient brought to the operating room. After adequate anesthesia she was placed in lithotomy position and prepped and draped in the usual sterile fashion. A timeout was performed to confirm the patient and procedure. A cystoscope was passed per urethra and the bladder carefully inspected. The bladder was examined with a 30 and 70 lens. Bladder mucosa was very healthy and capacity seemed a normal. The 5 [place] open-ended catheter was used to cannulate the right ureteral orifice and retrograde injection of contrast was performed. A 5 French open-ended  catheter was used to cannulate the left ureteral orifice and retrograde injection of contrast was performed. The bladder was drained. 30 mL of Marcaine was instilled into the bladder per the scope and the scope removed. She was awakened and taken to the recovery room in stable condition.  Complications: None Blood loss: Minimal Specimens: None Drains: None Disposition: Patient stable to PACU

## 2015-06-06 NOTE — Discharge Instructions (Signed)
Cystoscopy, Care After Refer to this sheet in the next few weeks. These instructions provide you with information on caring for yourself after your procedure. Your caregiver may also give you more specific instructions. Your treatment has been planned according to current medical practices, but problems sometimes occur. Call your caregiver if you have any problems or questions after your procedure. HOME CARE INSTRUCTIONS  Things you can do to ease any discomfort after your procedure include:  Drinking enough water and fluids to keep your urine clear or pale yellow.  Taking a warm bath to relieve any burning feelings. SEEK IMMEDIATE MEDICAL CARE IF:   You have an increase in blood in your urine.  You notice blood clots in your urine.  You have difficulty passing urine.  You have the chills.  You have abdominal pain.  You have a fever or persistent symptoms for more than 2-3 days.  You have a fever and your symptoms suddenly get worse. MAKE SURE YOU:   Understand these instructions.  Will watch your condition.  Will get help right away if you are not doing well or get worse.   This information is not intended to replace advice given to you by your health care provider. Make sure you discuss any questions you have with your health care provider.  General Anesthesia, Adult, Care After Refer to this sheet in the next few weeks. These instructions provide you with information on caring for yourself after your procedure. Your health care provider may also give you more specific instructions. Your treatment has been planned according to current medical practices, but problems sometimes occur. Call your health care provider if you have any problems or questions after your procedure. WHAT TO EXPECT AFTER THE PROCEDURE After the procedure, it is typical to experience:  Sleepiness.  Nausea and vomiting. HOME CARE INSTRUCTIONS  For the first 24 hours after general anesthesia:  Have a  responsible person with you.  Do not drive a car. If you are alone, do not take public transportation.  Do not drink alcohol.  Do not take medicine that has not been prescribed by your health care provider.  Do not sign important papers or make important decisions.  You may resume a normal diet and activities as directed by your health care provider.  Change bandages (dressings) as directed.  If you have questions or problems that seem related to general anesthesia, call the hospital and ask for the anesthetist or anesthesiologist on call. SEEK MEDICAL CARE IF:  You have nausea and vomiting that continue the day after anesthesia.  You develop a rash. SEEK IMMEDIATE MEDICAL CARE IF:   You have difficulty breathing.  You have chest pain.  You have any allergic problems.   This information is not intended to replace advice given to you by your health care provider. Make sure you discuss any questions you have with your health care provider.   Document Released: 10/22/2000 Document Revised: 08/06/2014 Document Reviewed: 11/14/2011 Elsevier Interactive Patient Education [employer].

## 2015-06-06 NOTE — Progress Notes (Signed)
In with pt at this time. Pt is vomiting and complaining of severe nausea. Dr. Kayleen Memos notified and orders given for 6.25 mg of Phenergan. Lights in the room are dimmed and curtains pulled. VS and orders assessed and will continue to monitor and tx pt according to MD orders. Husband and daughter remains at the bedside.

## 2015-06-06 NOTE — Anesthesia Preprocedure Evaluation (Addendum)
Anesthesia Evaluation  Patient identified by MRN, date of birth, ID band Patient awake    Reviewed: Allergy & Precautions, NPO status , Patient's Chart, lab work & pertinent test results  History of Anesthesia Complications (+) PONV  Airway Mallampati: II  TM Distance: >3 FB Neck ROM: Full    Dental  (+) Upper Dentures   Pulmonary former smoker,    breath sounds clear to auscultation       Cardiovascular Exercise Tolerance: Good Normal cardiovascular exam Rhythm:Regular Rate:Normal     Neuro/Psych    GI/Hepatic negative GI ROS,   Endo/Other  diabetes, Type 2BG 134.  Renal/GU Stones     Musculoskeletal   Abdominal (+)  Abdomen: soft.    Peds  Hematology  (+) anemia , Hb 11.7.   Anesthesia Other Findings   Reproductive/Obstetrics                            Anesthesia Physical Anesthesia Plan  ASA: III  Anesthesia Plan: General   Post-op Pain Management:    Induction: Intravenous  Airway Management Planned: LMA  Additional Equipment:   Intra-op Plan:   Post-operative Plan: Extubation in OR  Informed Consent: I have reviewed the patients History and Physical, chart, labs and discussed the procedure including the risks, benefits and alternatives for the proposed anesthesia with the patient or authorized representative who has indicated his/her understanding and acceptance.     Plan Discussed with: CRNA  Anesthesia Plan Comments:         Anesthesia Quick Evaluation

## 2015-06-06 NOTE — Anesthesia Postprocedure Evaluation (Signed)
  Anesthesia Post-op Note  Patient: Amy Hansen  Procedure(s) Performed: Procedure(s): CYSTOSCOPY WITH RETROGRADE PYELOGRAM (Bilateral)  Anesthesia type:General  Patient location: PACU  Post pain: Pain level controlled  Post assessment: Post-op Vital signs reviewed, Patient's Cardiovascular Status Stable, Respiratory Function Stable, Patent Airway and No signs of Nausea or vomiting  Post vital signs: Reviewed and stable  Last Vitals:  Filed Vitals:   06/06/15 1346  BP: 125/78  Pulse: 93  Temp: 36.4 C  Resp: 12    Level of consciousness: awake, alert  and patient cooperative  Complications: No apparent anesthesia complications

## 2015-06-06 NOTE — Transfer of Care (Signed)
Immediate Anesthesia Transfer of Care Note  Patient: Amy Hansen  Procedure(s) Performed: Procedure(s): CYSTOSCOPY WITH RETROGRADE PYELOGRAM (Bilateral)  Patient Location: PACU  Anesthesia Type:General  Level of Consciousness: awake, alert , oriented and patient cooperative  Airway & Oxygen Therapy: Patient Spontanous Breathing and Patient connected to face mask oxygen  Post-op Assessment: Report given to RN, Post -op Vital signs reviewed and stable and Patient moving all extremities X 4  Post vital signs: Reviewed and stable  Last Vitals:  Filed Vitals:   06/06/15 1346  BP: 125/78  Pulse: 93  Temp: 36.4 C  Resp: 12    Complications: No apparent anesthesia complications

## 2015-06-06 NOTE — Anesthesia Procedure Notes (Signed)
Procedure Name: LMA Insertion Date/Time: 06/06/2015 1:16 PM Performed by: Silvana Newness Pre-anesthesia Checklist: Patient identified, Emergency Drugs available, Suction available, Patient being monitored and Timeout performed Patient Re-evaluated:Patient Re-evaluated prior to inductionOxygen Delivery Method: Circle system utilized Preoxygenation: Pre-oxygenation with 100% oxygen Intubation Type: IV induction Ventilation: Mask ventilation without difficulty LMA: LMA inserted LMA Size: 3.5 Number of attempts: 1 Placement Confirmation: positive ETCO2 and breath sounds checked- equal and bilateral Tube secured with: Tape Dental Injury: Teeth and Oropharynx as per pre-operative assessment

## 2015-06-06 NOTE — H&P (Signed)
H&P  Chief Complaint: pelvic pain  History of Present Illness: 59 yo with pelvic pain, recurrent UTI symptoms. Last cx and labs normal. Non-con CT reviewed -normal. She is well today with no dysuria, fever or hematuria.   Past Medical History  Diagnosis Date  . Anxiety   . Depression   . Diabetes ([place])   . Frequent headaches   . Recurrent UTI   . Urinary frequency   . Chronic kidney disease     UTI, hematuria in urine  . Anemia   . Diverticulosis   . Restless leg syndrome    Past Surgical History  Procedure Laterality Date  . Cholecystectomy  1975  . Hemorrhoid surgery  2013  . Gastric bypass  2010  . Carpal tunnel release Right 2003  . Knee arthroscopy Left 1996  . Abdominal hysterectomy    . Tonsillectomy      Home Medications:  Prescriptions prior to admission  Medication Sig Dispense Refill Last Dose  . ALPRAZolam (XANAX) 0.5 MG tablet Take 0.5 mg by mouth at bedtime as needed for anxiety.   06/06/2015 at Unknown time  . buPROPion (WELLBUTRIN XL) 150 MG 24 hr tablet Take 150 mg by mouth daily.    06/05/2015 at Unknown time  . Eszopiclone 3 MG TABS Take 3 mg by mouth at bedtime.    06/05/2015 at Unknown time  . lamoTRIgine (LAMICTAL) 150 MG tablet Take 150 mg by mouth at bedtime.    06/05/2015 at Unknown time  . pramipexole (MIRAPEX) 0.5 MG tablet Take 1 tablet (0.5 mg total) by mouth at bedtime. (Patient taking differently: Take 0.5 mg by mouth as needed. ) 90 tablet 0 Past Month at Unknown time  . risperiDONE (RISPERDAL) 1 MG tablet Take 1 mg by mouth at bedtime.    06/05/2015 at Unknown time  . traMADol (ULTRAM) 50 MG tablet Take 1 tablet (50 mg total) by mouth every 8 (eight) hours as needed. 30 tablet 0 Past Week at Unknown time  . Albiglutide (TANZEUM) 30 MG PEN Inject 30 mg into the skin once a week. (Tuesday)   05/16/2015  . butalbital-acetaminophen-caffeine (FIORICET, ESGIC) 50-325-40 MG tablet Take 1 tablet by mouth every 6 (six) hours as needed for headache. 40  tablet 0 06/01/2015  . fluticasone (FLONASE) 50 MCG/ACT nasal spray Place 2 sprays into the nose as needed.    06/03/2015   Allergies:  Allergies  Allergen Reactions  . Avelox [Moxifloxacin Hcl In Nacl] Anaphylaxis  . Stadol [Butorphanol] Rash    Family History  Problem Relation Age of Onset  . Stroke Father   . Bladder Cancer Neg Hx   . Kidney disease Neg Hx   . Prostate cancer Neg Hx    Social History:  reports that she quit smoking about 40 years ago. Her smoking use included Cigarettes. She smoked 0.50 packs per day. She has never used smokeless tobacco. She reports that she does not drink alcohol or use illicit drugs.  ROS: A complete review of systems was performed.  All systems are negative except for pertinent findings as noted. ROS   Physical Exam:  Vital signs in last 24 hours: Temp:  [96.2 F (35.7 C)] 96.2 F (35.7 C) (11/07 1205) Pulse Rate:  [65] 65 (11/07 1205) Resp:  [16] 16 (11/07 1205) BP: (117)/(62) 117/62 mmHg (11/07 1205) SpO2:  [100 %] 100 % (11/07 1205) General:  Alert and oriented, No acute distress HEENT: Normocephalic, atraumatic Neck: No JVD or lymphadenopathy Cardiovascular: Regular rate and  rhythm Lungs: Regular rate and effort Abdomen: Soft, nontender, nondistended, no abdominal masses Extremities: No edema Neurologic: Grossly intact  Laboratory Data:  Results for orders placed or performed during the hospital encounter of 06/06/15 (from the past 24 hour(s))  Glucose, capillary     Status: Abnormal   Collection Time: 06/06/15 11:55 AM  Result Value Ref Range   Glucose-Capillary 134 (H) 65 - 99 mg/dL   Recent Results (from the past 240 hour(s))  Urine culture     Status: None   Collection Time: 05/30/15 10:13 AM  Result Value Ref Range Status   Urine Culture, Routine Final report  Final   Urine Culture result 1 No growth  Final   Creatinine: No results for input(s): CREATININE in the last 168 hours.  Impression/Assessment:  Pelvic  pain, luts  Plan:  I discussed with the patient the nature, potential benefits, risks and alternatives to cysto, bilateral RGP's, including side effects of the proposed treatment, the likelihood of the patient achieving the goals of the procedure, and any potential problems that might occur during the procedure or recuperation. All questions answered. Discussed alternatives such as cystoscopy in the office and contrast CT imaging. Patient elects to proceed with the above.   Justinian Miano 06/06/2015, 1:04 PM

## 2015-06-07 ENCOUNTER — Encounter: Payer: Self-pay | Admitting: Family Medicine

## 2015-06-07 ENCOUNTER — Ambulatory Visit (INDEPENDENT_AMBULATORY_CARE_PROVIDER_SITE_OTHER): Payer: 59 | Admitting: Family Medicine

## 2015-06-07 VITALS — BP 150/89 | HR 68 | Temp 97.2°F | Wt 157.0 lb

## 2015-06-07 DIAGNOSIS — R030 Elevated blood-pressure reading, without diagnosis of hypertension: Secondary | ICD-10-CM | POA: Diagnosis not present

## 2015-06-07 DIAGNOSIS — N390 Urinary tract infection, site not specified: Secondary | ICD-10-CM | POA: Diagnosis not present

## 2015-06-07 DIAGNOSIS — IMO0001 Reserved for inherently not codable concepts without codable children: Secondary | ICD-10-CM

## 2015-06-07 NOTE — Patient Instructions (Signed)
DASH Eating Plan  DASH stands for "Dietary Approaches to Stop Hypertension." The DASH eating plan is a healthy eating plan that has been shown to reduce high blood pressure (hypertension). Additional health benefits may include reducing the risk of type 2 diabetes mellitus, heart disease, and stroke. The DASH eating plan may also help with weight loss.  WHAT DO I NEED TO KNOW ABOUT THE DASH EATING PLAN?  For the DASH eating plan, you will follow these general guidelines:  · Choose foods with a percent daily value for sodium of less than 5% (as listed on the food label).  · Use salt-free seasonings or herbs instead of table salt or sea salt.  · Check with your health care provider or pharmacist before using salt substitutes.  · Eat lower-sodium products, often labeled as "lower sodium" or "no salt added."  · Eat fresh foods.  · Eat more vegetables, fruits, and low-fat dairy products.  · Choose whole grains. Look for the word "whole" as the first word in the ingredient list.  · Choose fish and skinless chicken or turkey more often than red meat. Limit fish, poultry, and meat to 6 oz (170 g) each day.  · Limit sweets, desserts, sugars, and sugary drinks.  · Choose heart-healthy fats.  · Limit cheese to 1 oz (28 g) per day.  · Eat more home-cooked food and less restaurant, buffet, and fast food.  · Limit fried foods.  · Cook foods using methods other than frying.  · Limit canned vegetables. If you do use them, rinse them well to decrease the sodium.  · When eating at a restaurant, ask that your food be prepared with less salt, or no salt if possible.  WHAT FOODS CAN I EAT?  Seek help from a dietitian for individual calorie needs.  Grains  Whole grain or whole wheat bread. Brown rice. Whole grain or whole wheat pasta. Quinoa, bulgur, and whole grain cereals. Low-sodium cereals. Corn or whole wheat flour tortillas. Whole grain cornbread. Whole grain crackers. Low-sodium crackers.  Vegetables  Fresh or frozen vegetables  (raw, steamed, roasted, or grilled). Low-sodium or reduced-sodium tomato and vegetable juices. Low-sodium or reduced-sodium tomato sauce and paste. Low-sodium or reduced-sodium canned vegetables.   Fruits  All fresh, canned (in natural juice), or frozen fruits.  Meat and Other Protein Products  Ground beef (85% or leaner), grass-fed beef, or beef trimmed of fat. Skinless chicken or turkey. Ground chicken or turkey. Pork trimmed of fat. All fish and seafood. Eggs. Dried beans, peas, or lentils. Unsalted nuts and seeds. Unsalted canned beans.  Dairy  Low-fat dairy products, such as skim or 1% milk, 2% or reduced-fat cheeses, low-fat ricotta or cottage cheese, or plain low-fat yogurt. Low-sodium or reduced-sodium cheeses.  Fats and Oils  Tub margarines without trans fats. Light or reduced-fat mayonnaise and salad dressings (reduced sodium). Avocado. Safflower, olive, or canola oils. Natural peanut or almond butter.  Other  Unsalted popcorn and pretzels.  The items listed above may not be a complete list of recommended foods or beverages. Contact your dietitian for more options.  WHAT FOODS ARE NOT RECOMMENDED?  Grains  White bread. White pasta. White rice. Refined cornbread. Bagels and croissants. Crackers that contain trans fat.  Vegetables  Creamed or fried vegetables. Vegetables in a cheese sauce. Regular canned vegetables. Regular canned tomato sauce and paste. Regular tomato and vegetable juices.  Fruits  Dried fruits. Canned fruit in light or heavy syrup. Fruit juice.  Meat and Other Protein   Products  Fatty cuts of meat. Ribs, chicken wings, bacon, sausage, bologna, salami, chitterlings, fatback, hot dogs, bratwurst, and packaged luncheon meats. Salted nuts and seeds. Canned beans with salt.  Dairy  Whole or 2% milk, cream, half-and-half, and cream cheese. Whole-fat or sweetened yogurt. Full-fat cheeses or blue cheese. Nondairy creamers and whipped toppings. Processed cheese, cheese spreads, or cheese  curds.  Condiments  Onion and garlic salt, seasoned salt, table salt, and sea salt. Canned and packaged gravies. Worcestershire sauce. Tartar sauce. Barbecue sauce. Teriyaki sauce. Soy sauce, including reduced sodium. Steak sauce. Fish sauce. Oyster sauce. Cocktail sauce. Horseradish. Ketchup and mustard. Meat flavorings and tenderizers. Bouillon cubes. Hot sauce. Tabasco sauce. Marinades. Taco seasonings. Relishes.  Fats and Oils  Butter, stick margarine, lard, shortening, ghee, and bacon fat. Coconut, palm kernel, or palm oils. Regular salad dressings.  Other  Pickles and olives. Salted popcorn and pretzels.  The items listed above may not be a complete list of foods and beverages to avoid. Contact your dietitian for more information.  WHERE CAN I FIND MORE INFORMATION?  National Heart, Lung, and Blood Institute: www.nhlbi.nih.gov/health/health-topics/topics/dash/     This information is not intended to replace advice given to you by your health care provider. Make sure you discuss any questions you have with your health care provider.     Document Released: 07/05/2011 Document Revised: 08/06/2014 Document Reviewed: 05/20/2013  Elsevier Interactive Patient Education ©2016 Elsevier Inc.

## 2015-06-07 NOTE — Assessment & Plan Note (Signed)
Continue to follow with urology. Referral for future appointments made today. Continue to monitor.

## 2015-06-07 NOTE — Progress Notes (Signed)
BP 150/89 mmHg  Pulse 68  Temp(Src) 97.2 F (36.2 C)  Wt 157 lb (71.215 kg)  SpO2 99%   Subjective:    Patient ID: Amy Hansen, female    DOB: March 19, 1956, 59 y.o.   MRN: [id number]  HPI: Amy Hansen is a 59 y.o. female  Chief Complaint  Patient presents with  . Hematuria    Patient needs a referral to [facility], she needs it to be dated 05/23/15 if possible, they will not accept the referral from Urgent Care   Patient presents today for referral to urology. Has been seeing them for 2 months, but she got a letter from her insurance stating that the referral from the Urgent Care didn't count and that she needed a referral from her PCP or she would not get covered. She has been following with them and is doing well. She notes that she is a little groggy today from being under anesthesia yesterday, but is otherwise feeling well with no other concerns or complaints.   Relevant past medical, surgical, family and social history reviewed and updated as indicated. Interim medical history since our last visit reviewed. Allergies and medications reviewed and updated.  Review of Systems  Constitutional: Negative.   Respiratory: Negative.   Cardiovascular: Negative.   Gastrointestinal: Negative.   Genitourinary: Negative.   Psychiatric/Behavioral: Negative.     Per HPI unless specifically indicated above     Objective:    BP 150/89 mmHg  Pulse 68  Temp(Src) 97.2 F (36.2 C)  Wt 157 lb (71.215 kg)  SpO2 99%  Wt Readings from Last 3 Encounters:  06/07/15 157 lb (71.215 kg)  05/30/15 156 lb (70.761 kg)  05/23/15 153 lb (69.4 kg)    Physical Exam  Constitutional: She is oriented to person, place, and time. She appears well-developed and well-nourished. No distress.  HENT:  Head: Normocephalic and atraumatic.  Right Ear: Hearing normal.  Left Ear: Hearing normal.  Nose: Nose normal.  Eyes: Conjunctivae and lids are normal. Right eye exhibits no discharge. Left  eye exhibits no discharge. No scleral icterus.  Cardiovascular: Normal rate, regular rhythm, normal heart sounds and intact distal pulses.  Exam reveals no gallop and no friction rub.   No murmur heard. Pulmonary/Chest: Effort normal and breath sounds normal. No respiratory distress. She has no wheezes. She has no rales. She exhibits no tenderness.  Musculoskeletal: Normal range of motion.  Neurological: She is alert and oriented to person, place, and time.  Skin: Skin is warm, dry and intact. No rash noted. No erythema. No pallor.  Psychiatric: She has a normal mood and affect. Her speech is normal and behavior is normal. Judgment and thought content normal. Cognition and memory are normal.  Nursing note and vitals reviewed.   Results for orders placed or performed during the hospital encounter of 06/06/15  Glucose, capillary  Result Value Ref Range   Glucose-Capillary 134 (H) 65 - 99 mg/dL  Glucose, capillary  Result Value Ref Range   Glucose-Capillary 129 (H) 65 - 99 mg/dL      Assessment & Plan:   Problem List Items Addressed This Visit      Genitourinary   Recurrent UTI - Primary    Continue to follow with urology. Referral for future appointments made today. Continue to monitor.       Relevant Orders   Ambulatory referral to Urology    Other Visit Diagnoses    Elevated BP  Information on DASH diet given today. Return as scheduled for recheck on BP.        Follow up plan: Return if symptoms worsen or fail to improve.

## 2015-06-13 ENCOUNTER — Ambulatory Visit (INDEPENDENT_AMBULATORY_CARE_PROVIDER_SITE_OTHER): Payer: 59 | Admitting: Urology

## 2015-06-13 ENCOUNTER — Encounter: Payer: Self-pay | Admitting: Urology

## 2015-06-13 VITALS — BP 146/87 | HR 83 | Ht 62.0 in | Wt 155.6 lb

## 2015-06-13 DIAGNOSIS — N390 Urinary tract infection, site not specified: Secondary | ICD-10-CM | POA: Diagnosis not present

## 2015-06-13 DIAGNOSIS — R300 Dysuria: Secondary | ICD-10-CM

## 2015-06-13 LAB — URINALYSIS, COMPLETE
BILIRUBIN UA: NEGATIVE
GLUCOSE, UA: NEGATIVE
KETONES UA: NEGATIVE
Leukocytes, UA: NEGATIVE
NITRITE UA: POSITIVE — AB
PROTEIN UA: NEGATIVE
Specific Gravity, UA: <1.005 — ABNORMAL LOW (ref 1.005–1.030)
UUROB: 0.2 mg/dL (ref 0.2–1.0)
pH, UA: 6 (ref 5.0–7.5)

## 2015-06-13 LAB — MICROSCOPIC EXAMINATION: Bacteria, UA: NONE SEEN

## 2015-06-13 LAB — BLADDER SCAN AMB NON-IMAGING: SCAN RESULT: 128

## 2015-06-13 MED ORDER — URELLE 81 MG PO TABS: 1.0000 | ORAL_TABLET | Freq: Four times a day (QID) | ORAL | Status: DC

## 2015-06-13 NOTE — Progress Notes (Signed)
06/13/2015 10:16 AM   [name] [birth year] [id number]  Referring provider: Kathrine Haddock, NP [place], [place] [address]  Chief Complaint  Patient presents with  . Urinary Tract Infection    HPI: Patient is a 59 year old white female who presents today complaining of a four day history of dysuria and bladder spasms that are occuring intermittently.  She is not experiencing any gross hematuria.  She is not having any associated fevers, chills, nausea or vomiting.  She is taking OTC AZO with some relief.  Her UA is unremarkable today.    About three weeks ago, she was seen in [facility]  ED for gross hematuria and had a non-contrast CT that demonstrated no renal, ureteral or bladder calculi.  Her urine culture at that time was negative for infection.  She was still having significant bladder symptoms, so she underwent cystoscopy with bilateral retrogrades and no GU pathology was identified.    She continues to have intermittent bladder spasms.  She has done some research on the Internet and feels she has interstitial cystitis.     PMH: Past Medical History  Diagnosis Date  . Anxiety   . Depression   . Diabetes ([place])   . Frequent headaches   . Recurrent UTI   . Urinary frequency   . Chronic kidney disease     UTI, hematuria in urine  . Anemia   . Diverticulosis   . Restless leg syndrome     Surgical History: Past Surgical History  Procedure Laterality Date  . Cholecystectomy  1975  . Hemorrhoid surgery  2013  . Gastric bypass  2010  . Carpal tunnel release Right 2003  . Knee arthroscopy Left 1996  . Abdominal hysterectomy    . Tonsillectomy    . Cystoscopy w/ retrogrades Bilateral 06/06/2015    Procedure: CYSTOSCOPY WITH RETROGRADE PYELOGRAM;  Surgeon: Festus Aloe, MD;  Location: [facility] ORS;  Service: Urology;  Laterality: Bilateral;    Home Medications:    Medication List       This list is accurate as of: 06/13/15 11:59 PM.  Always use your most recent  med list.               ALPRAZolam 0.5 MG tablet  Commonly known as:  XANAX  Take 0.5 mg by mouth at bedtime as needed for anxiety.     buPROPion 150 MG 24 hr tablet  Commonly known as:  WELLBUTRIN XL  Take 150 mg by mouth daily.     butalbital-acetaminophen-caffeine 50-325-40 MG tablet  Commonly known as:  FIORICET, ESGIC  Take 1 tablet by mouth every 6 (six) hours as needed for headache.     Eszopiclone 3 MG Tabs  Take 3 mg by mouth at bedtime.     fluticasone 50 MCG/ACT nasal spray  Commonly known as:  FLONASE  Place 2 sprays into the nose as needed.     lamoTRIgine 150 MG tablet  Commonly known as:  LAMICTAL  Take 150 mg by mouth at bedtime.     pramipexole 0.5 MG tablet  Commonly known as:  MIRAPEX  Take 1 tablet (0.5 mg total) by mouth at bedtime.     risperiDONE 1 MG tablet  Commonly known as:  RISPERDAL  Take 1 mg by mouth at bedtime.     TANZEUM 30 MG Pen  Generic drug:  Albiglutide  Inject 30 mg into the skin once a week. (Tuesday)     traMADol 50 MG tablet  Commonly known as:  ULTRAM  Take 1 tablet (50 mg total) by mouth every 8 (eight) hours as needed.     URELLE 81 MG Tabs tablet  Take 1 tablet (81 mg total) by mouth 4 (four) times daily.        Allergies:  Allergies  Allergen Reactions  . Avelox [Moxifloxacin Hcl In Nacl] Anaphylaxis  . Stadol [Butorphanol] Rash    Family History: Family History  Problem Relation Age of Onset  . Stroke Father   . Bladder Cancer Neg Hx   . Kidney disease Neg Hx   . Prostate cancer Neg Hx     Social History:  reports that she quit smoking about 40 years ago. Her smoking use included Cigarettes. She smoked 0.50 packs per day. She has never used smokeless tobacco. She reports that she does not drink alcohol or use illicit drugs.  ROS: UROLOGY Frequent Urination?: Yes Hard to postpone urination?: Yes Burning/pain with urination?: Yes Get up at night to urinate?: Yes Leakage of urine?: No Urine  stream starts and stops?: No Trouble starting stream?: No Do you have to strain to urinate?: No Blood in urine?: Yes Urinary tract infection?: Yes Sexually transmitted disease?: No Injury to kidneys or bladder?: No Painful intercourse?: Yes Weak stream?: No Currently pregnant?: No Vaginal bleeding?: No Last menstrual period?: n  Gastrointestinal Nausea?: No Vomiting?: No Indigestion/heartburn?: No Diarrhea?: Yes Constipation?: Yes  Constitutional Fever: No Night sweats?: No Weight loss?: No Fatigue?: Yes  Skin Skin rash/lesions?: No Itching?: No  Eyes Blurred vision?: No Double vision?: No  Ears/Nose/Throat Sore throat?: No Sinus problems?: Yes  Hematologic/Lymphatic Swollen glands?: No Easy bruising?: Yes  Cardiovascular Leg swelling?: Yes Chest pain?: No  Respiratory Cough?: No Shortness of breath?: No  Endocrine Excessive thirst?: No  Musculoskeletal Back pain?: No Joint pain?: No  Neurological Headaches?: Yes Dizziness?: No  Psychologic Depression?: Yes Anxiety?: Yes  Physical Exam: BP 146/87 mmHg  Pulse 83  Ht 5' 2"  (1.575 m)  Wt 155 lb 9.6 oz (70.58 kg)  BMI 28.45 kg/m2  Constitutional: Well nourished. Alert and oriented, No acute distress. HEENT: [place] AT, moist mucus membranes. Trachea midline, no masses. Cardiovascular: No clubbing, cyanosis, or edema. Respiratory: Normal respiratory effort, no increased work of breathing. GI: Abdomen is soft, non tender, non distended, no abdominal masses. Liver and spleen not palpable.  No hernias appreciated.  Stool sample for occult testing is not indicated.   GU: No CVA tenderness.  No bladder fullness or masses.   Skin: No rashes, bruises or suspicious lesions. Lymph: No cervical or inguinal adenopathy. Neurologic: Grossly intact, no focal deficits, moving all 4 extremities. Psychiatric: Normal mood and affect.  Laboratory Data: Lab Results  Component Value Date   WBC 4.6 05/30/2015    HGB 12.3 05/22/2015   HCT 36.6 05/30/2015   MCV 87.2 05/22/2015   PLT 271 05/22/2015    Lab Results  Component Value Date   CREATININE 0.63 05/30/2015    Lab Results  Component Value Date   HGBA1C 6.6* 01/10/2012    Urinalysis Results for orders placed or performed in visit on 06/13/15  Microscopic Examination  Result Value Ref Range   WBC, UA 0-5 0 -  5 /hpf   RBC, UA 0-2 0 -  2 /hpf   Epithelial Cells (non renal) 0-10 0 - 10 /hpf   Renal Epithel, UA 0-10 (A) None seen /hpf   Bacteria, UA None seen None seen/Few  Urinalysis, Complete  Result Value Ref  Range   Specific Gravity, UA <1.005 (L) 1.005 - 1.030   pH, UA 6.0 5.0 - 7.5   Color, UA Amber (A) Yellow   Appearance Ur Clear Clear   Leukocytes, UA Negative Negative   Protein, UA Negative Negative/Trace   Glucose, UA Negative Negative   Ketones, UA Negative Negative   RBC, UA Trace (A) Negative   Bilirubin, UA Negative Negative   Urobilinogen, Ur 0.2 0.2 - 1.0 mg/dL   Nitrite, UA Positive (A) Negative   Microscopic Examination See below:   BLADDER SCAN AMB NON-IMAGING  Result Value Ref Range   Scan Result 128      Assessment & Plan:    1. Dysuria:   I will send the patient's urine for culture and will wait to prescribe an antibiotic until sensitives are available.  If the urine culture is negative, I will obtain an urine cytology.  I have sent a prescription to her pharmacy for U[place].    - Urinalysis, Complete - BLADDER SCAN AMB NON-IMAGING  2. Recurrent UTI:   Patient has had 3 documented infections in less than one year.  She has not had a recent UTI, but she is still experiencing bladder spasms.  Her urine will be sent today for culture.  Return for pending urine culture.  Zara Council, [facility] [address], [place], [place] [address] [phone]

## 2015-06-14 DIAGNOSIS — R300 Dysuria: Secondary | ICD-10-CM | POA: Insufficient documentation

## 2015-06-15 LAB — CULTURE, URINE COMPREHENSIVE

## 2015-06-16 ENCOUNTER — Telehealth: Payer: Self-pay

## 2015-06-16 DIAGNOSIS — N390 Urinary tract infection, site not specified: Secondary | ICD-10-CM

## 2015-06-16 MED ORDER — AMOXICILLIN-POT CLAVULANATE 875-125 MG PO TABS: 1.0000 | ORAL_TABLET | Freq: Two times a day (BID) | ORAL | Status: AC

## 2015-06-16 NOTE — Telephone Encounter (Signed)
-----   Message from Nori Riis, PA-C sent at 06/15/2015 11:50 AM EST ----- Patient has an infection.  She needs to start Augmentin 875/125 one tablet twice daily for seven days and then recheck in 3 to 5 days for a CATH specimen.

## 2015-06-16 NOTE — Telephone Encounter (Signed)
LMOM-medication sent to pharmacy

## 2015-06-17 NOTE — Telephone Encounter (Signed)
Pt stated she spoke with michelle.

## 2015-06-17 NOTE — Telephone Encounter (Signed)
Spoke with the patient and she is aware and will follow up after she finishes the abx  michelle

## 2015-07-20 ENCOUNTER — Telehealth: Payer: Self-pay | Admitting: *Deleted

## 2015-07-20 NOTE — Telephone Encounter (Signed)
Pt called to say that she is very fatigued, no energy and having frequent UTI's that she is seeing Zara Council PA for.  She wants to know if she can have labs checked and then if needed see md.  She uses [facility] for her labs because she gets them for free.  The fax # to send form for labs is [phone]. I told pt that I will have to ask md in am and then let her know what md wants to do. She is agreeable to plan.

## 2015-07-22 ENCOUNTER — Encounter: Payer: Self-pay | Admitting: Internal Medicine

## 2015-07-26 ENCOUNTER — Telehealth: Payer: Self-pay | Admitting: *Deleted

## 2015-07-26 NOTE — Telephone Encounter (Signed)
Pt contacted to let her know the labs she had 12/23 resulted out on 12/24 and faxed to [facility].  Her cbc completely normal no IDA, her ferritin level was within normal limits and she would not need any iron.  Asked her to call me back if she has questions or concerns.

## 2015-08-03 ENCOUNTER — Encounter: Payer: Self-pay | Admitting: Unknown Physician Specialty

## 2015-08-03 ENCOUNTER — Telehealth: Payer: Self-pay | Admitting: Unknown Physician Specialty

## 2015-08-03 ENCOUNTER — Ambulatory Visit (INDEPENDENT_AMBULATORY_CARE_PROVIDER_SITE_OTHER): Payer: Managed Care, Other (non HMO) | Admitting: Unknown Physician Specialty

## 2015-08-03 VITALS — BP 119/82 | HR 74 | Temp 98.3°F | Ht 61.1 in | Wt 155.6 lb

## 2015-08-03 DIAGNOSIS — E119 Type 2 diabetes mellitus without complications: Secondary | ICD-10-CM

## 2015-08-03 MED ORDER — METFORMIN HCL 1000 MG PO TABS: 1000.0000 mg | ORAL_TABLET | Freq: Two times a day (BID) | ORAL | Status: DC

## 2015-08-03 MED ORDER — CANAGLIFLOZIN 100 MG PO TABS: 100.0000 mg | ORAL_TABLET | Freq: Every day | ORAL | Status: DC

## 2015-08-03 NOTE — Progress Notes (Signed)
BP 119/82 mmHg  Pulse 74  Temp(Src) 98.3 F (36.8 C)  Ht 5' 1.1" (1.552 m)  Wt 155 lb 9.6 oz (70.58 kg)  BMI 29.30 kg/m2  SpO2 98%   Subjective:    Patient ID: Amy Hansen, female    DOB: October 19, 1955, 60 y.o.   MRN: [id number]  HPI: Amy Hansen is a 60 y.o. female  Chief Complaint  Patient presents with  . Fatigue    pt states she has felt tired for about 3 to 4 months  . Shortness of Breath    pt states she feels like a medication is causing her SOB   Labs were sent over by [facility].  Included was a CBC, BMP, TSH and Lithium.  Sodium was 131 but blood glucose was 419.  Fatigue has been ongoing for 3 to 4 months.  She is complaining of frequent urination and thirst.  No feet problems.  Feeling SOB but breathing is normal.  Checking BS at home and it is running around 220 and sometimes higher.  GFR was 103  Relevant past medical, surgical, family and social history reviewed and updated as indicated. Interim medical history since our last visit reviewed. Allergies and medications reviewed and updated.  Review of Systems  Per HPI unless specifically indicated above     Objective:    BP 119/82 mmHg  Pulse 74  Temp(Src) 98.3 F (36.8 C)  Ht 5' 1.1" (1.552 m)  Wt 155 lb 9.6 oz (70.58 kg)  BMI 29.30 kg/m2  SpO2 98%  Wt Readings from Last 3 Encounters:  08/03/15 155 lb 9.6 oz (70.58 kg)  06/13/15 155 lb 9.6 oz (70.58 kg)  06/07/15 157 lb (71.215 kg)    Physical Exam  Constitutional: She is oriented to person, place, and time. She appears well-developed and well-nourished. No distress.  HENT:  Head: Normocephalic and atraumatic.  Eyes: Conjunctivae and lids are normal. Right eye exhibits no discharge. Left eye exhibits no discharge. No scleral icterus.  Neck: Normal range of motion. Neck supple. No JVD present. Carotid bruit is not present.  Cardiovascular: Normal rate, regular rhythm and normal heart sounds.   Pulmonary/Chest: Effort normal and breath sounds normal.   Abdominal: Normal appearance. There is no splenomegaly or hepatomegaly.  Musculoskeletal: Normal range of motion.  Neurological: She is alert and oriented to person, place, and time.  Skin: Skin is warm, dry and intact. No rash noted. No pallor.  Psychiatric: She has a normal mood and affect. Her behavior is normal. Judgment and thought content normal.      Assessment & Plan:   Problem List Items Addressed This Visit    None    Visit Diagnoses    Type 2 diabetes mellitus without complication, without long-term current use of insulin (HCC)    -  Primary    Relevant Medications    risperiDONE (RISPERDAL) 3 MG tablet    zaleplon (SONATA) 10 MG capsule    canagliflozin (INVOKANA) 100 MG TABS tablet    metFORMIN (GLUCOPHAGE) 1000 MG tablet    Other Relevant Orders    Microalbumin, Urine Waived    Bayer DCA Hb A1c Waived    Lipid Panel Piccolo, Waived    Glucose Hemocue Waived       Not a new diagnosis with a history of DM but diet controlled for quite some time.  She does not want to go to the lifestyle center.  Blood sugar is 200 now. Will start Metformin 500 mg twice  a day and Invokana 100 mg.    Follow up plan: Return in about 4 weeks (around 08/31/2015).

## 2015-08-03 NOTE — Telephone Encounter (Signed)
Pt left without checking out, called and left voicemail to call and schedule follow up appt.

## 2015-08-04 LAB — LIPID PANEL PICCOLO, WAIVED
CHOL/HDL RATIO PICCOLO,WAIVE: 2.8 mg/dL
CHOLESTEROL PICCOLO, WAIVED: 138 mg/dL (ref <200)
HDL Chol Piccolo, Waived: 49 mg/dL — ABNORMAL LOW (ref >59)
LDL Chol Calc Piccolo Waived: 61 mg/dL (ref <100)
Triglycerides Piccolo,Waived: 139 mg/dL (ref <150)
VLDL Chol Calc Piccolo,Waive: 28 mg/dL (ref <30)

## 2015-08-04 LAB — GLUCOSE HEMOCUE WAIVED: GLU HEMOCUE WAIVED: 200 mg/dL — AB (ref 65–99)

## 2015-08-04 LAB — MICROALBUMIN, URINE WAIVED
Creatinine, Urine Waived: 50 mg/dL (ref 10–300)
MICROALB, UR WAIVED: 10 mg/L (ref 0–19)

## 2015-08-04 LAB — BAYER DCA HB A1C WAIVED: HB A1C: 9.4 % — AB (ref <7.0)

## 2015-08-04 NOTE — Telephone Encounter (Signed)
Called patient and scheduled her 4 week f/u for 08/31/15.

## 2015-08-09 ENCOUNTER — Telehealth: Payer: Self-pay | Admitting: Unknown Physician Specialty

## 2015-08-09 NOTE — Telephone Encounter (Signed)
Pt would like a call back invokana. She stated that she did research on the medication and it has effects of heart attacks, strokes, kidney failure and kidney. She would like to know her other options.

## 2015-08-09 NOTE — Telephone Encounter (Signed)
Routing to provider  

## 2015-08-09 NOTE — Telephone Encounter (Signed)
Another option would be an injection.  An example is Victoza or Byetta.  It can be daily or weekly

## 2015-08-09 NOTE — Telephone Encounter (Signed)
Called and let patient know about other options. Patient stated she was going to research them and then give [name] a call back.

## 2015-08-15 NOTE — Telephone Encounter (Signed)
Routing to provider  

## 2015-08-15 NOTE — Telephone Encounter (Signed)
Pt called stated she has decided to go with Byetta. Pharm is [facility] [address] in [place]. Thanks.

## 2015-08-15 NOTE — Telephone Encounter (Signed)
Called and left patient a voicemail asking for her to please return my call.  

## 2015-08-15 NOTE — Telephone Encounter (Signed)
Please ask her if it's the Byetta she has in mind which is twice a day vs Bydureon which is weekly

## 2015-08-16 MED ORDER — EXENATIDE ER 2 MG ~~LOC~~ PEN: 2.0000 mg | PEN_INJECTOR | SUBCUTANEOUS | Status: DC

## 2015-08-16 NOTE — Telephone Encounter (Signed)
Patient returned my call and stated she wanted to do the bydureon, the weekly injection. Pharmacy is [facility].

## 2015-08-16 NOTE — Telephone Encounter (Signed)
Called and left patient a voicemail asking for her to please return my call.  

## 2015-08-17 ENCOUNTER — Encounter: Payer: Self-pay | Admitting: Urology

## 2015-08-17 ENCOUNTER — Ambulatory Visit (INDEPENDENT_AMBULATORY_CARE_PROVIDER_SITE_OTHER): Payer: 59 | Admitting: Urology

## 2015-08-17 VITALS — BP 113/70 | HR 98 | Ht 62.0 in | Wt 155.7 lb

## 2015-08-17 DIAGNOSIS — N390 Urinary tract infection, site not specified: Secondary | ICD-10-CM

## 2015-08-17 DIAGNOSIS — R300 Dysuria: Secondary | ICD-10-CM | POA: Diagnosis not present

## 2015-08-17 LAB — URINALYSIS, COMPLETE
Bilirubin, UA: NEGATIVE
Glucose, UA: NEGATIVE
NITRITE UA: NEGATIVE
Protein, UA: NEGATIVE
RBC, UA: NEGATIVE
SPEC GRAV UA: 1.01 (ref 1.005–1.030)
Urobilinogen, Ur: 0.2 mg/dL (ref 0.2–1.0)
pH, UA: 5.5 (ref 5.0–7.5)

## 2015-08-17 LAB — MICROSCOPIC EXAMINATION
BACTERIA UA: NONE SEEN
RBC MICROSCOPIC, UA: NONE SEEN /HPF (ref 0–?)

## 2015-08-17 NOTE — Progress Notes (Signed)
8:31 AM   Amy Hansen 11/03/1955 [id number]  Referring provider: Kathrine Haddock, NP [place], [place] [id number]  Chief Complaint  Patient presents with  . Recurrent UTI    3 month follow up    HPI: Patient is a 60 year old Caucasian female who presents today for a 3 month follow up after under a work up for gross hematuria, with a non-contrast CT and cystoscopy with bilateral retrogrades.    Previous history She was seen in [facility]  ED on 05/22/2015 for gross hematuria and had a non-contrast CT that demonstrated no renal, ureteral or bladder calculi.  Her urine culture at that time was negative for infection.  She was still having significant bladder symptoms, so she underwent cystoscopy with bilateral retrogrades on  06/06/2015 and no GU pathology was identified.    Today, she is not having any urinary symptoms.  She has not experienced any further gross hematuria.  Her UA was negative on today's exam.  She has not had recent fevers, chills, nausea or vomiting.  She is having diarrhea due to her starting metformin recently.     PMH: Past Medical History  Diagnosis Date  . Anxiety   . Depression   . Diabetes ([place])   . Frequent headaches   . Recurrent UTI   . Urinary frequency   . Chronic kidney disease     UTI, hematuria in urine  . Anemia   . Diverticulosis   . Restless leg syndrome     Surgical History: Past Surgical History  Procedure Laterality Date  . Cholecystectomy  1975  . Hemorrhoid surgery  2013  . Gastric bypass  2010  . Carpal tunnel release Right 2003  . Knee arthroscopy Left 1996  . Abdominal hysterectomy    . Tonsillectomy    . Cystoscopy w/ retrogrades Bilateral 06/06/2015    Procedure: CYSTOSCOPY WITH RETROGRADE PYELOGRAM;  Surgeon: Festus Aloe, MD;  Location: [facility] ORS;  Service: Urology;  Laterality: Bilateral;    Home Medications:    Medication List       This list is accurate as of: 08/17/15 11:59 PM.  Always use your most recent  med list.               ALPRAZolam 0.5 MG tablet  Commonly known as:  XANAX  Take 0.5 mg by mouth at bedtime as needed for anxiety.     buPROPion 150 MG 24 hr tablet  Commonly known as:  WELLBUTRIN XL  Take 150 mg by mouth daily.     butalbital-acetaminophen-caffeine 50-325-40 MG tablet  Commonly known as:  FIORICET, ESGIC  Take 1 tablet by mouth every 6 (six) hours as needed for headache.     canagliflozin 100 MG Tabs tablet  Commonly known as:  INVOKANA  Take 1 tablet (100 mg total) by mouth daily before breakfast.     Eszopiclone 3 MG Tabs  Take 3 mg by mouth at bedtime.     Exenatide ER 2 MG Pen  Commonly known as:  BYDUREON  Inject 2 mg into the skin once a week.     fluticasone 50 MCG/ACT nasal spray  Commonly known as:  FLONASE  Place 2 sprays into the nose as needed.     lamoTRIgine 150 MG tablet  Commonly known as:  LAMICTAL  Take 150 mg by mouth at bedtime.     metFORMIN 1000 MG tablet  Commonly known as:  GLUCOPHAGE  Take 1 tablet (1,000 mg total)  by mouth 2 (two) times daily with a meal.     pramipexole 0.5 MG tablet  Commonly known as:  MIRAPEX  Take 1 tablet (0.5 mg total) by mouth at bedtime.     risperiDONE 3 MG tablet  Commonly known as:  RISPERDAL  Take 3 mg by mouth at bedtime.     TANZEUM 30 MG Pen  Generic drug:  Albiglutide  Inject 30 mg into the skin once a week. Reported on 08/17/2015     URELLE 81 MG Tabs tablet  Take 1 tablet (81 mg total) by mouth 4 (four) times daily.     zaleplon 10 MG capsule  Commonly known as:  SONATA  Take 10 mg by mouth daily as needed.        Allergies:  Allergies  Allergen Reactions  . Avelox [Moxifloxacin Hcl In Nacl] Anaphylaxis  . Stadol [Butorphanol] Rash    Family History: Family History  Problem Relation Age of Onset  . Stroke Father   . Bladder Cancer Neg Hx   . Kidney disease Neg Hx   . Prostate cancer Neg Hx     Social History:  reports that she quit smoking about 40 years ago.  Her smoking use included Cigarettes. She smoked 0.50 packs per day. She has never used smokeless tobacco. She reports that she does not drink alcohol or use illicit drugs.  ROS: UROLOGY Frequent Urination?: Yes Hard to postpone urination?: Yes Burning/pain with urination?: Yes Get up at night to urinate?: Yes Leakage of urine?: No Urine stream starts and stops?: No Trouble starting stream?: No Do you have to strain to urinate?: No Blood in urine?: No Urinary tract infection?: No Sexually transmitted disease?: No Injury to kidneys or bladder?: No Painful intercourse?: No Weak stream?: No Currently pregnant?: No Vaginal bleeding?: No Last menstrual period?: n  Gastrointestinal Nausea?: No Vomiting?: No Indigestion/heartburn?: No Diarrhea?: No Constipation?: No  Constitutional Fever: No Night sweats?: No Weight loss?: No Fatigue?: No  Skin Skin rash/lesions?: No Itching?: No  Eyes Blurred vision?: No Double vision?: No  Ears/Nose/Throat Sore throat?: No Sinus problems?: No  Hematologic/Lymphatic Swollen glands?: No Easy bruising?: No  Cardiovascular Leg swelling?: No Chest pain?: No  Respiratory Cough?: No Shortness of breath?: Yes  Endocrine Excessive thirst?: Yes  Musculoskeletal Back pain?: Yes Joint pain?: No  Neurological Headaches?: No Dizziness?: Yes  Psychologic Depression?: No Anxiety?: No  Physical Exam: BP 113/70 mmHg  Pulse 98  Ht 5' 2"  (1.575 m)  Wt 155 lb 11.2 oz (70.625 kg)  BMI 28.47 kg/m2  Constitutional: Well nourished. Alert and oriented, No acute distress. HEENT: [place] AT, moist mucus membranes. Trachea midline, no masses. Cardiovascular: No clubbing, cyanosis, or edema. Respiratory: Normal respiratory effort, no increased work of breathing. GI: Abdomen is soft, non tender, non distended, no abdominal masses. Liver and spleen not palpable.  No hernias appreciated.  Stool sample for occult testing is not indicated.     GU: No CVA tenderness.  No bladder fullness or masses.   Skin: No rashes, bruises or suspicious lesions. Lymph: No cervical or inguinal adenopathy. Neurologic: Grossly intact, no focal deficits, moving all 4 extremities. Psychiatric: Normal mood and affect.  Laboratory Data: Lab Results  Component Value Date   WBC 4.6 05/30/2015   HGB 12.3 05/22/2015   HCT 36.6 05/30/2015   MCV 88 05/30/2015   PLT 271 05/22/2015    Lab Results  Component Value Date   CREATININE 0.63 05/30/2015    Lab Results  Component Value Date   HGBA1C 6.6* 01/10/2012    Urinalysis Results for orders placed or performed in visit on 08/17/15  Microscopic Examination  Result Value Ref Range   WBC, UA 0-5 0 -  5 /hpf   RBC, UA None seen 0 -  2 /hpf   Epithelial Cells (non renal) 0-10 0 - 10 /hpf   Renal Epithel, UA 0-10 (A) None seen /hpf   Mucus, UA Present (A) Not Estab.   Bacteria, UA None seen None seen/Few  Urinalysis, Complete  Result Value Ref Range   Specific Gravity, UA 1.010 1.005 - 1.030   pH, UA 5.5 5.0 - 7.5   Color, UA Yellow Yellow   Appearance Ur Clear Clear   Leukocytes, UA Trace (A) Negative   Protein, UA Negative Negative/Trace   Glucose, UA Negative Negative   Ketones, UA Trace (A) Negative   RBC, UA Negative Negative   Bilirubin, UA Negative Negative   Urobilinogen, Ur 0.2 0.2 - 1.0 mg/dL   Nitrite, UA Negative Negative   Microscopic Examination See below:      Assessment & Plan:    1. Dysuria:   Resolved for now.  She will RTC in 3 months for symptom recheck, exam and UA.  Today's UA is negative.  - Urinalysis, Complete   2. Recurrent UTI:   Patient has had 3 documented infections in less than one year.  She is currently not having symptoms.  Her UA is negative.  We will continue to monitor.  She will RTC in 3 months for symptom recheck, exam and UA.   Return in about 3 months (around 11/15/2015) for exam and UA.  Zara Council, [facility] [address], [place], [place] [address] [phone]

## 2015-08-31 ENCOUNTER — Ambulatory Visit: Payer: Self-pay | Admitting: Unknown Physician Specialty

## 2015-09-12 LAB — HM DIABETES EYE EXAM

## 2015-09-13 ENCOUNTER — Ambulatory Visit (INDEPENDENT_AMBULATORY_CARE_PROVIDER_SITE_OTHER): Payer: Managed Care, Other (non HMO) | Admitting: Unknown Physician Specialty

## 2015-09-13 ENCOUNTER — Encounter: Payer: Self-pay | Admitting: Unknown Physician Specialty

## 2015-09-13 VITALS — BP 126/84 | HR 73 | Temp 98.1°F | Ht 60.9 in | Wt 154.0 lb

## 2015-09-13 DIAGNOSIS — G43009 Migraine without aura, not intractable, without status migrainosus: Secondary | ICD-10-CM

## 2015-09-13 DIAGNOSIS — E1165 Type 2 diabetes mellitus with hyperglycemia: Secondary | ICD-10-CM

## 2015-09-13 DIAGNOSIS — E119 Type 2 diabetes mellitus without complications: Secondary | ICD-10-CM

## 2015-09-13 MED ORDER — EXENATIDE ER 2 MG ~~LOC~~ PEN: 2.0000 mg | PEN_INJECTOR | SUBCUTANEOUS | Status: DC

## 2015-09-13 MED ORDER — METFORMIN HCL 1000 MG PO TABS: 1000.0000 mg | ORAL_TABLET | Freq: Two times a day (BID) | ORAL | Status: DC

## 2015-09-13 MED ORDER — TRAMADOL HCL 50 MG PO TABS: 50.0000 mg | ORAL_TABLET | Freq: Three times a day (TID) | ORAL | Status: DC | PRN

## 2015-09-13 NOTE — Assessment & Plan Note (Signed)
>>  ASSESSMENT AND PLAN FOR DIABETES MELLITUS TYPE 2 IN NONOBESE (HCC) WRITTEN ON 09/13/2015  9:40 AM BY Gabriel Cirri, NP  Doing better with the addition of Bydureon

## 2015-09-13 NOTE — Progress Notes (Signed)
BP 126/84 mmHg  Pulse 73  Temp(Src) 98.1 F (36.7 C)  Ht 5' 0.9" (1.547 m)  Wt 154 lb (69.854 kg)  BMI 29.19 kg/m2  SpO2 100%  LMP  (LMP Unknown)   Subjective:    Patient ID: Amy Hansen, female    DOB: October 09, 1955, 60 y.o.   MRN: [id number]  HPI: Amy Hansen is a 60 y.o. female  Chief Complaint  Patient presents with  . Diabetes   F/U of poorly controlled blood sugar.  Pt started weekly Bydureon and doing well.  She states that her blood sugars are generally lower and highest she has seen is 200.  She feels better with less fatigue.     Migraines: she had been taking Fiorinol but found Tramadol seemed to work beter and would like a prescription for this  Relevant past medical, surgical, family and social history reviewed and updated as indicated. Interim medical history since our last visit reviewed. Allergies and medications reviewed and updated.  Review of Systems  Per HPI unless specifically indicated above     Objective:    BP 126/84 mmHg  Pulse 73  Temp(Src) 98.1 F (36.7 C)  Ht 5' 0.9" (1.547 m)  Wt 154 lb (69.854 kg)  BMI 29.19 kg/m2  SpO2 100%  LMP  (LMP Unknown)  Wt Readings from Last 3 Encounters:  09/13/15 154 lb (69.854 kg)  08/17/15 155 lb 11.2 oz (70.625 kg)  08/03/15 155 lb 9.6 oz (70.58 kg)    Physical Exam  Constitutional: She is oriented to person, place, and time. She appears well-developed and well-nourished. No distress.  HENT:  Head: Normocephalic and atraumatic.  Eyes: Conjunctivae and lids are normal. Right eye exhibits no discharge. Left eye exhibits no discharge. No scleral icterus.  Cardiovascular: Normal rate.   Pulmonary/Chest: Effort normal.  Abdominal: Normal appearance. There is no splenomegaly or hepatomegaly.  Musculoskeletal: Normal range of motion.  Neurological: She is alert and oriented to person, place, and time.  Skin: Skin is intact. No rash noted. No pallor.  Psychiatric: She has a normal mood and affect.  Her behavior is normal. Judgment and thought content normal.    Results for orders placed or performed in visit on 08/17/15  Microscopic Examination  Result Value Ref Range   WBC, UA 0-5 0 -  5 /hpf   RBC, UA None seen 0 -  2 /hpf   Epithelial Cells (non renal) 0-10 0 - 10 /hpf   Renal Epithel, UA 0-10 (A) None seen /hpf   Mucus, UA Present (A) Not Estab.   Bacteria, UA None seen None seen/Few  Urinalysis, Complete  Result Value Ref Range   Specific Gravity, UA 1.010 1.005 - 1.030   pH, UA 5.5 5.0 - 7.5   Color, UA Yellow Yellow   Appearance Ur Clear Clear   Leukocytes, UA Trace (A) Negative   Protein, UA Negative Negative/Trace   Glucose, UA Negative Negative   Ketones, UA Trace (A) Negative   RBC, UA Negative Negative   Bilirubin, UA Negative Negative   Urobilinogen, Ur 0.2 0.2 - 1.0 mg/dL   Nitrite, UA Negative Negative   Microscopic Examination See below:       Assessment & Plan:   Problem List Items Addressed This Visit      Unprioritized   Migraines - Primary    Rx for Tramadol      Relevant Medications   traMADol (ULTRAM) 50 MG tablet   Poorly controlled type 2  diabetes mellitus ([place])    Doing better with the addition of Bydureon      Relevant Medications   Exenatide ER (BYDUREON) 2 MG PEN   metFORMIN (GLUCOPHAGE) 1000 MG tablet    Other Visit Diagnoses    Type 2 diabetes mellitus without complication, without long-term current use of insulin (HCC)        Relevant Medications    Exenatide ER (BYDUREON) 2 MG PEN    metFORMIN (GLUCOPHAGE) 1000 MG tablet        Follow up plan: No Follow-up on file.

## 2015-09-13 NOTE — Assessment & Plan Note (Signed)
Rx for Tramadol

## 2015-09-13 NOTE — Assessment & Plan Note (Signed)
Doing better with the addition of Bydureon

## 2015-11-01 ENCOUNTER — Other Ambulatory Visit: Payer: Self-pay | Admitting: Family Medicine

## 2015-11-01 NOTE — Telephone Encounter (Signed)
Your patient 

## 2015-11-09 ENCOUNTER — Ambulatory Visit: Payer: Managed Care, Other (non HMO) | Admitting: Unknown Physician Specialty

## 2015-11-29 ENCOUNTER — Other Ambulatory Visit: Payer: Self-pay | Admitting: Family Medicine

## 2015-11-30 NOTE — Telephone Encounter (Signed)
Your patient 

## 2016-01-02 ENCOUNTER — Telehealth: Payer: Self-pay | Admitting: Unknown Physician Specialty

## 2016-01-02 DIAGNOSIS — N644 Mastodynia: Secondary | ICD-10-CM

## 2016-01-02 NOTE — Telephone Encounter (Signed)
Pt would like to get an order for a mammogram. She said she is having breast tenderness in her right breast.

## 2016-01-02 NOTE — Telephone Encounter (Signed)
Called and left patient a voicemail letting her know that orders were placed. I gave her the number to [facility] and ask for her to give [name] a call if we can do anything else for her.

## 2016-01-02 NOTE — Telephone Encounter (Signed)
Routing to provider  

## 2016-01-20 ENCOUNTER — Ambulatory Visit
Admission: RE | Admit: 2016-01-20 | Discharge: 2016-01-20 | Disposition: A | Discharge status: Home / Self Care | Payer: Managed Care, Other (non HMO) | Source: Ambulatory Visit | Attending: Unknown Physician Specialty | Admitting: Unknown Physician Specialty

## 2016-01-20 ENCOUNTER — Other Ambulatory Visit: Payer: Self-pay | Admitting: Unknown Physician Specialty

## 2016-01-20 DIAGNOSIS — N644 Mastodynia: Secondary | ICD-10-CM

## 2016-01-20 DIAGNOSIS — Z1231 Encounter for screening mammogram for malignant neoplasm of breast: Secondary | ICD-10-CM

## 2016-01-20 IMAGING — MG MM DIGITAL SCREENING BILAT W/ TOMO W/ CAD
9 of 14 series · 9 of 30 positions shown · non-contrast
Comparison: Previous exam(s).

CLINICAL DATA: Screening.

EXAM:
2D DIGITAL SCREENING BILATERAL MAMMOGRAM WITH CAD AND ADJUNCT TOMO

[L MLO]
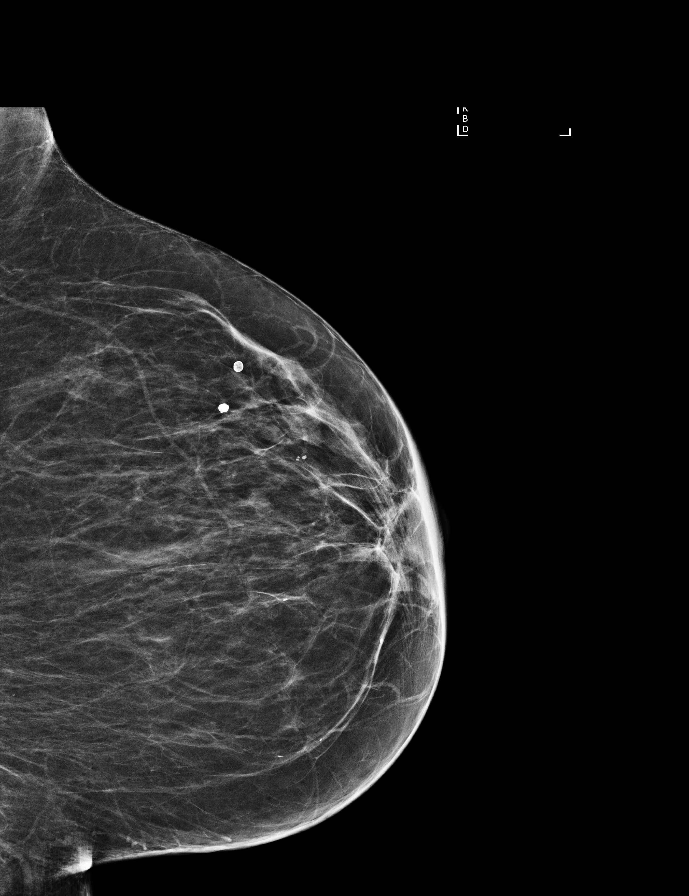

[R MLO (1 of 2)]
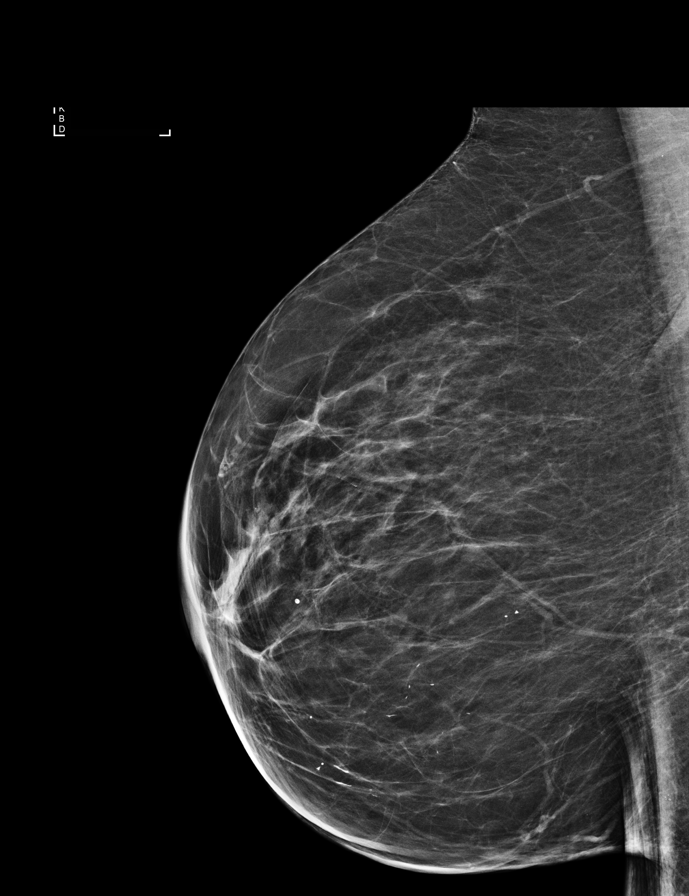

[L CC]
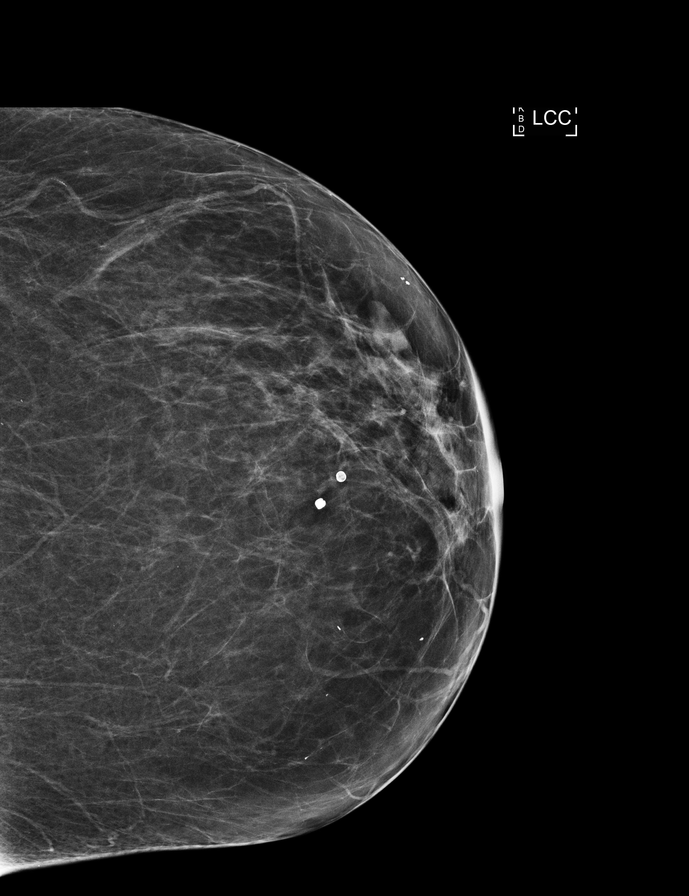

[R MLO synth-2D]
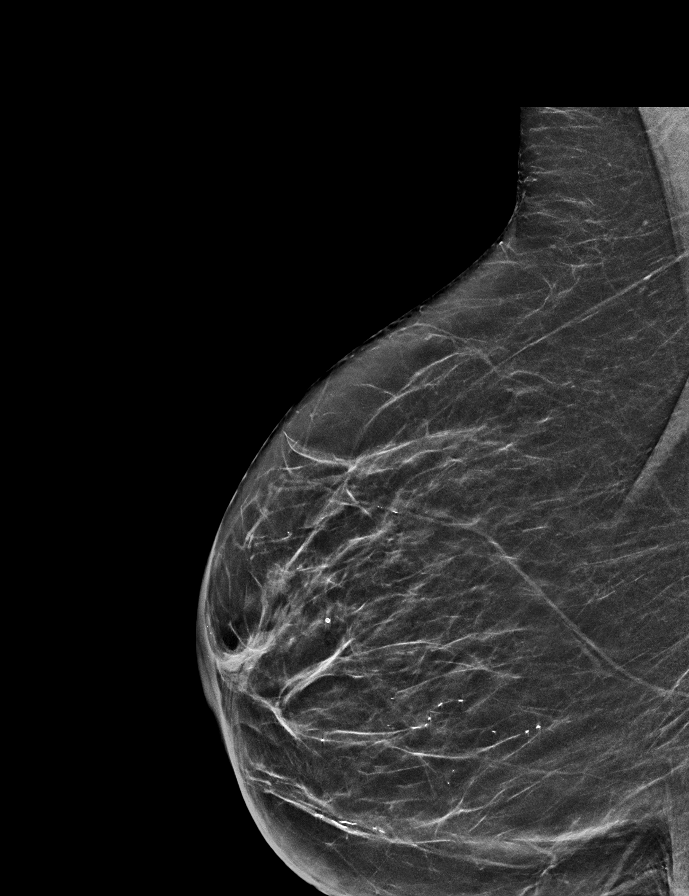

[R CC]
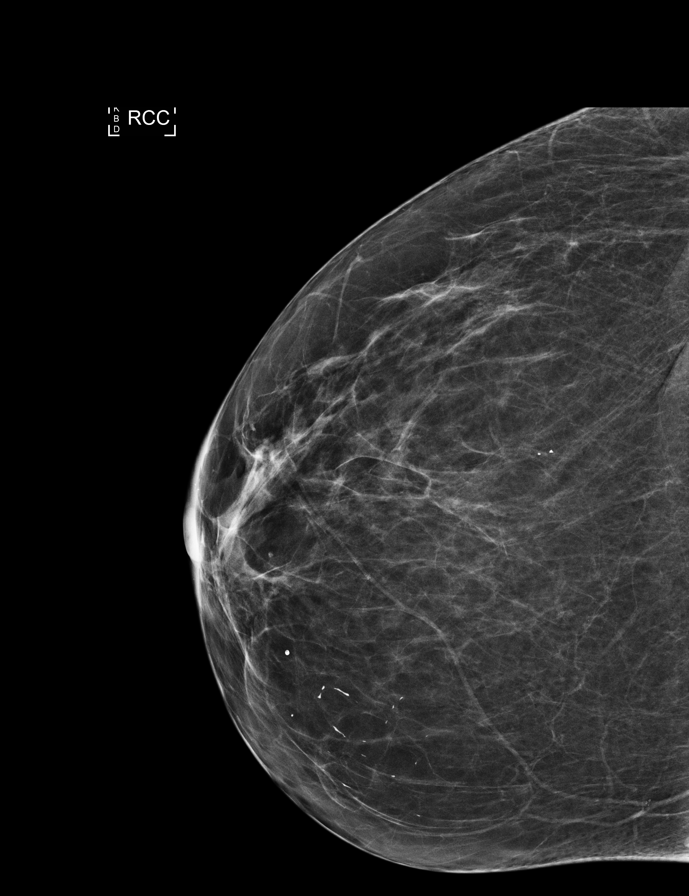

[L MLO synth-2D]
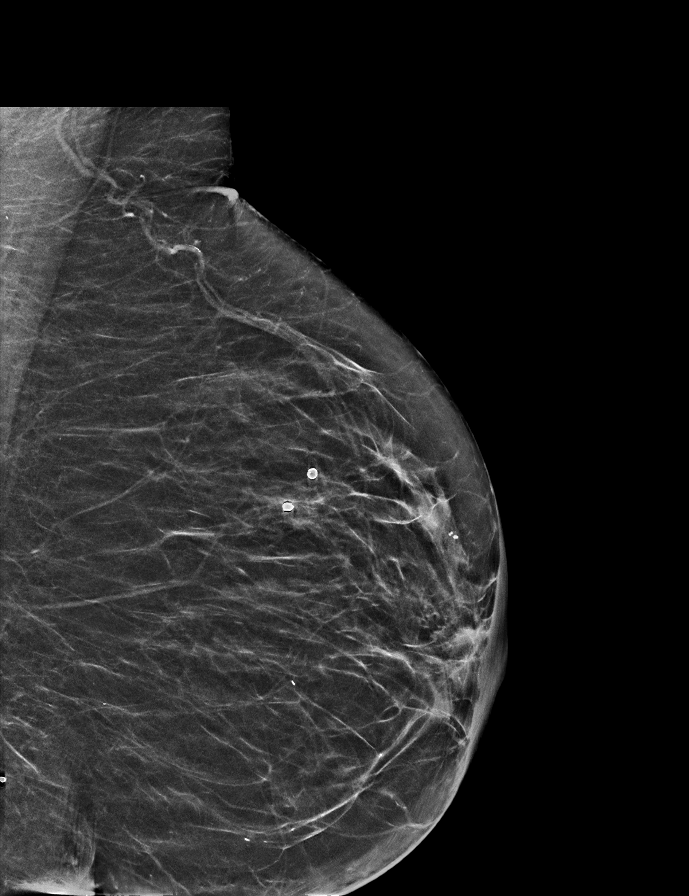

[R MLO (2 of 2)]
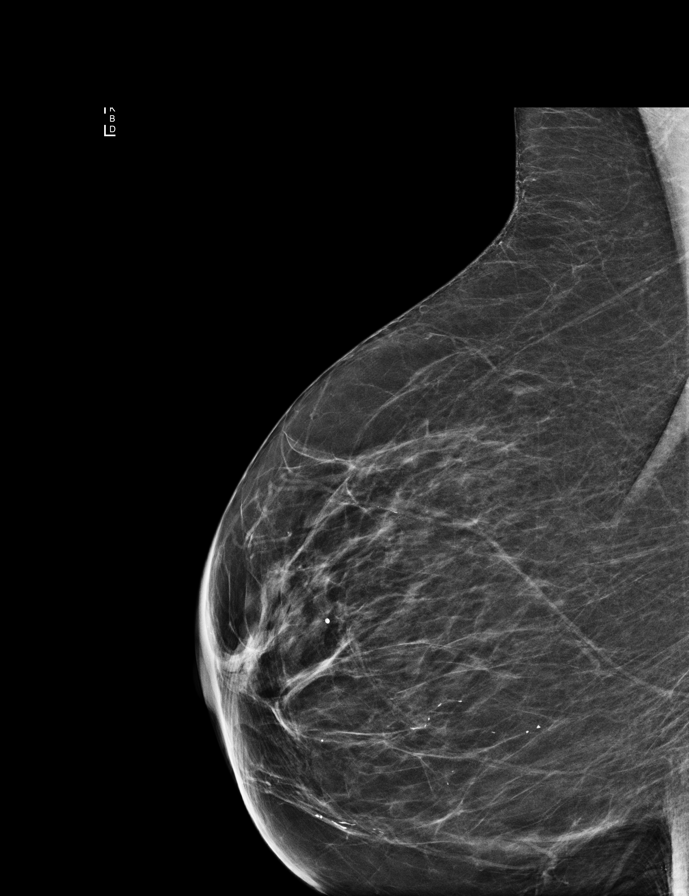

[R CC synth-2D]
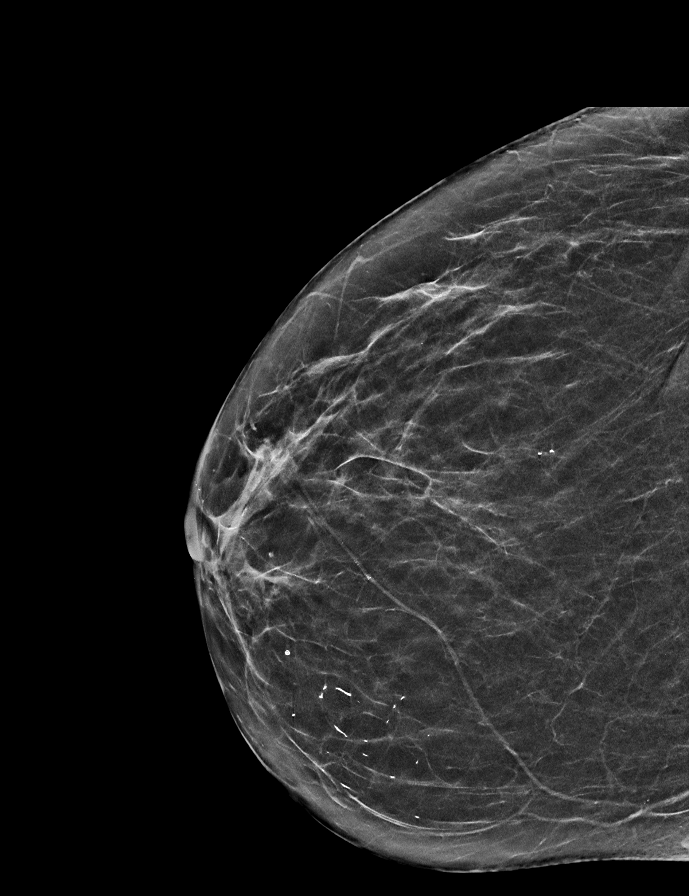

[L CC synth-2D]
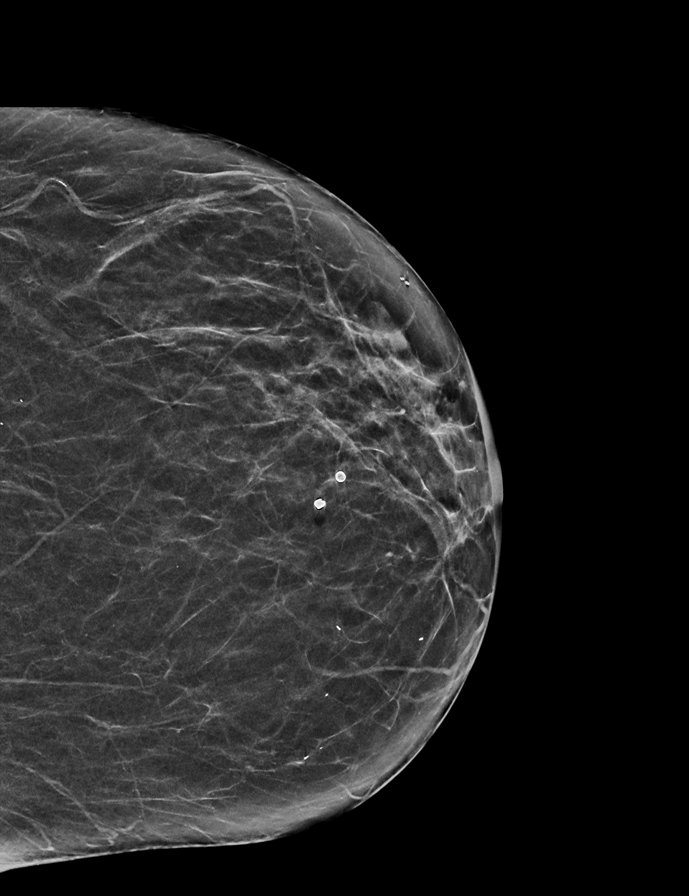

[9 of 30 positions shown; findings below may reference images not displayed]

ACR Breast Density Category b: There are scattered areas of
fibroglandular density.
FINDINGS: There are no findings suspicious for malignancy. Images were
processed with CAD.
IMPRESSION: No mammographic evidence of malignancy. A result letter of this
screening mammogram will be mailed directly to the patient.

RECOMMENDATION:
Screening mammogram in one year. (Code:[33])

BI-RADS CATEGORY  1: Negative.

## 2016-02-01 ENCOUNTER — Other Ambulatory Visit: Payer: Self-pay | Admitting: Unknown Physician Specialty

## 2016-02-01 MED ORDER — TRAMADOL HCL 50 MG PO TABS: 50.0000 mg | ORAL_TABLET | Freq: Three times a day (TID) | ORAL | Status: DC | PRN

## 2016-02-01 NOTE — Telephone Encounter (Signed)
Routing to provider  

## 2016-02-01 NOTE — Telephone Encounter (Signed)
Pt is out of Tramadol and has a migraine, would like to get a prescription asap.  Please call when ready, her husband, [name] will pick up per pt.

## 2016-02-01 NOTE — Telephone Encounter (Signed)
Called and left patient a voicemail letting her know that rx was ready for pick up.

## 2016-02-06 ENCOUNTER — Inpatient Hospital Stay: Payer: Managed Care, Other (non HMO) | Admitting: Family Medicine

## 2016-02-21 ENCOUNTER — Ambulatory Visit (INDEPENDENT_AMBULATORY_CARE_PROVIDER_SITE_OTHER): Payer: Managed Care, Other (non HMO) | Admitting: Unknown Physician Specialty

## 2016-02-21 ENCOUNTER — Encounter: Payer: Self-pay | Admitting: Unknown Physician Specialty

## 2016-02-21 VITALS — BP 155/95 | HR 69 | Temp 98.0°F | Ht 61.8 in | Wt 156.4 lb

## 2016-02-21 DIAGNOSIS — E559 Vitamin D deficiency, unspecified: Secondary | ICD-10-CM | POA: Insufficient documentation

## 2016-02-21 DIAGNOSIS — R159 Full incontinence of feces: Secondary | ICD-10-CM

## 2016-02-21 DIAGNOSIS — D649 Anemia, unspecified: Secondary | ICD-10-CM | POA: Diagnosis not present

## 2016-02-21 DIAGNOSIS — E538 Deficiency of other specified B group vitamins: Secondary | ICD-10-CM

## 2016-02-21 DIAGNOSIS — R5382 Chronic fatigue, unspecified: Secondary | ICD-10-CM | POA: Diagnosis not present

## 2016-02-21 MED ORDER — CYANOCOBALAMIN 1000 MCG/ML IJ SOLN
1000.0000 ug | INTRAMUSCULAR | Status: AC
  Administered 2016-02-21 – 2016-11-27 (×8): 1000 ug via INTRAMUSCULAR

## 2016-02-21 NOTE — Assessment & Plan Note (Signed)
Taking 50 K Vit D wekly through psychiatry

## 2016-02-21 NOTE — Progress Notes (Signed)
BP (!) 155/95 (BP Location: Left Arm, Cuff Size: Normal)   Pulse 69   Temp 98 F (36.7 C)   Ht 5' 1.8" (1.57 m)   Wt 156 lb 6.4 oz (70.9 kg)   LMP  (LMP Unknown)   SpO2 99%   BMI 28.79 kg/m    Subjective:    Patient ID: Amy Hansen, female    DOB: 1956-06-14, 60 y.o.   MRN: [id number]  HPI: Amy Hansen is a 60 y.o. female  Chief Complaint  Patient presents with  . Fatigue    pt states she has felt tired and had some lab done recently that show vitamin defs.    Fatigue Psychiatrist did some lab work due to fatigue. Her Vitamin D was found to be 6.2 and B12 143.    Finds she is gassy and is having pain.  She is having night time incontinence.  States she wears Depends at night for about 6 months. States these symptoms preceded the Bydureon.    Swelling  in feet and ankles, especially the left one.     Diabetes Last Hgb A1C was 9.4 in January.  She is refusing a Hgb A1C as "I'm not ready."  Not checking her blood sugar but her Glucose was 186  Relevant past medical, surgical, family and social history reviewed and updated as indicated. Interim medical history since our last visit reviewed. Allergies and medications reviewed and updated.  Review of Systems  Per HPI unless specifically indicated above     Objective:    BP (!) 155/95 (BP Location: Left Arm, Cuff Size: Normal)   Pulse 69   Temp 98 F (36.7 C)   Ht 5' 1.8" (1.57 m)   Wt 156 lb 6.4 oz (70.9 kg)   LMP  (LMP Unknown)   SpO2 99%   BMI 28.79 kg/m   Wt Readings from Last 3 Encounters:  02/21/16 156 lb 6.4 oz (70.9 kg)  09/13/15 154 lb (69.9 kg)  08/17/15 155 lb 11.2 oz (70.6 kg)    Physical Exam  Constitutional: She is oriented to person, place, and time. She appears well-developed and well-nourished. No distress.  HENT:  Head: Normocephalic and atraumatic.  Eyes: Conjunctivae and lids are normal. Right eye exhibits no discharge. Left eye exhibits no discharge. No scleral icterus.  Neck:  Normal range of motion. Neck supple. No JVD present. Carotid bruit is not present.  Cardiovascular: Normal rate, regular rhythm and normal heart sounds.   Pulmonary/Chest: Effort normal and breath sounds normal.  Abdominal: Normal appearance. There is no splenomegaly or hepatomegaly.  Musculoskeletal: Normal range of motion.       Right ankle: She exhibits swelling. She exhibits normal pulse.       Left ankle: She exhibits swelling. She exhibits normal pulse.  Trace edema  Neurological: She is alert and oriented to person, place, and time.  Skin: Skin is warm, dry and intact. No rash noted. No pallor.  Psychiatric: She has a normal mood and affect. Her behavior is normal. Judgment and thought content normal.    Results for orders placed or performed in visit on 08/17/15  Microscopic Examination  Result Value Ref Range   WBC, UA 0-5 0 - 5 /hpf   RBC, UA None seen 0 - 2 /hpf   Epithelial Cells (non renal) 0-10 0 - 10 /hpf   Renal Epithel, UA 0-10 (A) None seen /hpf   Mucus, UA Present (A) Not Estab.   Bacteria, UA  None seen None seen/Few  Urinalysis, Complete  Result Value Ref Range   Specific Gravity, UA 1.010 1.005 - 1.030   pH, UA 5.5 5.0 - 7.5   Color, UA Yellow Yellow   Appearance Ur Clear Clear   Leukocytes, UA Trace (A) Negative   Protein, UA Negative Negative/Trace   Glucose, UA Negative Negative   Ketones, UA Trace (A) Negative   RBC, UA Negative Negative   Bilirubin, UA Negative Negative   Urobilinogen, Ur 0.2 0.2 - 1.0 mg/dL   Nitrite, UA Negative Negative   Microscopic Examination See below:       Assessment & Plan:   Problem List Items Addressed This Visit      Unprioritized   B12 deficiency - Primary    Start B12 monthly plus Vit B12 sublingual      Relevant Medications   cyanocobalamin ((VITAMIN B-12)) injection 1,000 mcg   Incontinent of feces    Refer to GI      Relevant Orders   Ambulatory referral to Gastroenterology   Tissue transglutaminase,  IgA   Vitamin D deficiency    Taking 22 K Vit D wekly through psychiatry       Other Visit Diagnoses    Chronic fatigue       Anemia, unspecified anemia type       Relevant Medications   cyanocobalamin ((VITAMIN B-12)) injection 1,000 mcg   Other Relevant Orders   Ambulatory referral to Gastroenterology   Tissue transglutaminase, IgA   IgA      Check celiac panel Follow up plan: Return in about 4 weeks (around 03/20/2016) for sugar and B12 shot.

## 2016-02-21 NOTE — Assessment & Plan Note (Signed)
Refer to GI 

## 2016-02-21 NOTE — Assessment & Plan Note (Signed)
Start B12 monthly plus Vit B12 sublingual

## 2016-02-23 LAB — IGA: IgA/Immunoglobulin A, Serum: 94 mg/dL (ref 87–352)

## 2016-02-23 LAB — TISSUE TRANSGLUTAMINASE, IGA: Transglutaminase IgA: <2 U/mL (ref 0–3)

## 2016-02-24 ENCOUNTER — Encounter: Payer: Self-pay | Admitting: Unknown Physician Specialty

## 2016-02-27 ENCOUNTER — Telehealth: Payer: Self-pay | Admitting: Unknown Physician Specialty

## 2016-02-27 MED ORDER — PRAMIPEXOLE DIHYDROCHLORIDE 0.5 MG PO TABS
0.5000 mg | ORAL_TABLET | Freq: Every day | ORAL | 3 refills | Status: DC
Start: 2016-02-27 — End: 2017-02-18

## 2016-02-27 NOTE — Telephone Encounter (Signed)
Routing to provider  

## 2016-02-27 NOTE — Telephone Encounter (Signed)
Pt would like restless leg medication sent to [facility] for [facility] day supply.

## 2016-03-06 ENCOUNTER — Other Ambulatory Visit: Payer: Self-pay

## 2016-03-21 ENCOUNTER — Ambulatory Visit (INDEPENDENT_AMBULATORY_CARE_PROVIDER_SITE_OTHER): Payer: Managed Care, Other (non HMO) | Admitting: Unknown Physician Specialty

## 2016-03-21 ENCOUNTER — Encounter: Payer: Self-pay | Admitting: Unknown Physician Specialty

## 2016-03-21 ENCOUNTER — Other Ambulatory Visit: Payer: Self-pay

## 2016-03-21 VITALS — BP 115/70 | HR 84 | Temp 98.3°F | Ht 60.6 in | Wt 153.4 lb

## 2016-03-21 DIAGNOSIS — E538 Deficiency of other specified B group vitamins: Secondary | ICD-10-CM

## 2016-03-21 DIAGNOSIS — E1165 Type 2 diabetes mellitus with hyperglycemia: Secondary | ICD-10-CM | POA: Diagnosis not present

## 2016-03-21 DIAGNOSIS — I10 Essential (primary) hypertension: Secondary | ICD-10-CM | POA: Diagnosis not present

## 2016-03-21 DIAGNOSIS — E559 Vitamin D deficiency, unspecified: Secondary | ICD-10-CM

## 2016-03-21 DIAGNOSIS — E1159 Type 2 diabetes mellitus with other circulatory complications: Secondary | ICD-10-CM | POA: Insufficient documentation

## 2016-03-21 LAB — HEMOGLOBIN A1C

## 2016-03-21 MED ORDER — VITAMIN D (ERGOCALCIFEROL) 1.25 MG (50000 UNIT) PO CAPS: 50000.0000 [IU] | ORAL_CAPSULE | ORAL | 0 refills | Status: DC

## 2016-03-21 NOTE — Assessment & Plan Note (Signed)
Refilled weekly Vitamin D.  Recheck in 2 months

## 2016-03-21 NOTE — Assessment & Plan Note (Signed)
Injection today.  Recheck levels in 2 months

## 2016-03-21 NOTE — Assessment & Plan Note (Signed)
Stable, continue present medications.   

## 2016-03-21 NOTE — Progress Notes (Signed)
BP 115/70 (BP Location: Left Arm, Patient Position: Sitting, Cuff Size: Normal)   Pulse 84   Temp 98.3 F (36.8 C)   Ht 5' 0.6" (1.539 m)   Wt 153 lb 6.4 oz (69.6 kg)   LMP  (LMP Unknown)   SpO2 97%   BMI 29.37 kg/m    Subjective:    Patient ID: Amy Hansen, female    DOB: [birth year], 60 y.o.   MRN: [id number]  HPI: Amy Hansen is a 60 y.o. female  Chief Complaint  Patient presents with  . Diabetes   Diabetes: Using medications without difficulties No hypoglycemic episodes No hyperglycemic episodes Feet problems: Blood Sugars averaging: eye exam within last year Last Hgb A1C:9.4  Hypertension  Using medications without difficulty Average home BPs   Using medication without problems or lightheadedness No chest pain with exertion or shortness of breath No Edema  B12 deficiency Feeling better with B12 shots  Vit D Deficiency Taking weekly Vitamin D  Relevant past medical, surgical, family and social history reviewed and updated as indicated. Interim medical history since our last visit reviewed. Allergies and medications reviewed and updated.  Review of Systems  Per HPI unless specifically indicated above     Objective:    BP 115/70 (BP Location: Left Arm, Patient Position: Sitting, Cuff Size: Normal)   Pulse 84   Temp 98.3 F (36.8 C)   Ht 5' 0.6" (1.539 m)   Wt 153 lb 6.4 oz (69.6 kg)   LMP  (LMP Unknown)   SpO2 97%   BMI 29.37 kg/m   Wt Readings from Last 3 Encounters:  03/21/16 153 lb 6.4 oz (69.6 kg)  02/21/16 156 lb 6.4 oz (70.9 kg)  09/13/15 154 lb (69.9 kg)    Physical Exam  Constitutional: She is oriented to person, place, and time. She appears well-developed and well-nourished. No distress.  HENT:  Head: Normocephalic and atraumatic.  Eyes: Conjunctivae and lids are normal. Right eye exhibits no discharge. Left eye exhibits no discharge. No scleral icterus.  Neck: Normal range of motion. Neck supple. No JVD present. Carotid  bruit is not present.  Cardiovascular: Normal rate, regular rhythm and normal heart sounds.   Pulmonary/Chest: Effort normal and breath sounds normal.  Abdominal: Normal appearance. There is no splenomegaly or hepatomegaly.  Musculoskeletal: Normal range of motion.  Neurological: She is alert and oriented to person, place, and time.  Skin: Skin is warm, dry and intact. No rash noted. No pallor.  Psychiatric: She has a normal mood and affect. Her behavior is normal. Judgment and thought content normal.    Results for orders placed or performed in visit on 02/21/16  Tissue transglutaminase, IgA  Result Value Ref Range   Transglutaminase IgA <2 0 - 3 U/mL  IgA  Result Value Ref Range   IgA/Immunoglobulin A, Serum 94 87 - 352 mg/dL      Assessment & Plan:   Problem List Items Addressed This Visit      Unprioritized   B12 deficiency    Injection today.  Recheck levels in 2 months      Hypertension    Stable, continue present medications.        Poorly controlled type 2 diabetes mellitus ([place]) - Primary    Improved today.  Hgb A1C is 7.3 which is 9.4      Relevant Orders   Bayer DCA Hb A1c Waived   Comprehensive metabolic panel   Microalbumin, Urine Waived   Vitamin D  deficiency    Refilled weekly Vitamin D.  Recheck in 2 months       Other Visit Diagnoses   None.      Follow up plan: Return in about 2 months (around 05/21/2016) for Vit D and Vit B12.

## 2016-03-21 NOTE — Assessment & Plan Note (Signed)
>>  ASSESSMENT AND PLAN FOR DIABETES MELLITUS TYPE 2 IN NONOBESE (HCC) WRITTEN ON 03/21/2016  4:40 PM BY Gabriel Cirri, NP  Improved today.  Hgb A1C is 7.3 which is 9.4

## 2016-03-21 NOTE — Assessment & Plan Note (Signed)
Improved today.  Hgb A1C is 7.3 which is 9.4

## 2016-03-22 LAB — COMPREHENSIVE METABOLIC PANEL
ALBUMIN: 3.8 g/dL (ref 3.5–5.5)
ALK PHOS: 127 IU/L — AB (ref 39–117)
ALT: 17 IU/L (ref 0–32)
AST: 20 IU/L (ref 0–40)
Albumin/Globulin Ratio: 1.7 (ref 1.2–2.2)
BUN / CREAT RATIO: 17 (ref 9–23)
BUN: 12 mg/dL (ref 6–24)
Bilirubin Total: <0.2 mg/dL (ref 0.0–1.2)
CALCIUM: 8.5 mg/dL — AB (ref 8.7–10.2)
CO2: 22 mmol/L (ref 18–29)
CREATININE: 0.71 mg/dL (ref 0.57–1.00)
Chloride: 100 mmol/L (ref 96–106)
GFR calc Af Amer: 109 mL/min/{1.73_m2} (ref >59)
GFR, EST NON AFRICAN AMERICAN: 94 mL/min/{1.73_m2} (ref >59)
GLUCOSE: 155 mg/dL — AB (ref 65–99)
Globulin, Total: 2.3 g/dL (ref 1.5–4.5)
Potassium: 4.1 mmol/L (ref 3.5–5.2)
Sodium: 136 mmol/L (ref 134–144)
Total Protein: 6.1 g/dL (ref 6.0–8.5)

## 2016-03-22 LAB — MICROALBUMIN, URINE WAIVED
CREATININE, URINE WAIVED: 10 mg/dL (ref 10–300)
MICROALB, UR WAIVED: 10 mg/L (ref 0–19)
Microalb/Creat Ratio: <30 mg/g (ref <30)

## 2016-03-22 LAB — BAYER DCA HB A1C WAIVED: HB A1C (BAYER DCA - WAIVED): 7.3 % — ABNORMAL HIGH (ref <7.0)

## 2016-04-25 ENCOUNTER — Ambulatory Visit (INDEPENDENT_AMBULATORY_CARE_PROVIDER_SITE_OTHER): Payer: Managed Care, Other (non HMO)

## 2016-04-25 DIAGNOSIS — E538 Deficiency of other specified B group vitamins: Secondary | ICD-10-CM | POA: Diagnosis not present

## 2016-05-30 ENCOUNTER — Ambulatory Visit (INDEPENDENT_AMBULATORY_CARE_PROVIDER_SITE_OTHER): Payer: Managed Care, Other (non HMO) | Admitting: Unknown Physician Specialty

## 2016-05-30 ENCOUNTER — Other Ambulatory Visit: Payer: Self-pay

## 2016-05-30 ENCOUNTER — Encounter: Payer: Self-pay | Admitting: Unknown Physician Specialty

## 2016-05-30 VITALS — BP 145/87 | HR 80 | Temp 98.0°F | Wt 156.8 lb

## 2016-05-30 DIAGNOSIS — G43009 Migraine without aura, not intractable, without status migrainosus: Secondary | ICD-10-CM | POA: Diagnosis not present

## 2016-05-30 DIAGNOSIS — Z23 Encounter for immunization: Secondary | ICD-10-CM | POA: Diagnosis not present

## 2016-05-30 DIAGNOSIS — E538 Deficiency of other specified B group vitamins: Secondary | ICD-10-CM | POA: Diagnosis not present

## 2016-05-30 MED ORDER — TRAMADOL HCL 50 MG PO TABS: 50.0000 mg | ORAL_TABLET | Freq: Three times a day (TID) | ORAL | 1 refills | Status: DC | PRN

## 2016-05-30 MED ORDER — CYCLOBENZAPRINE HCL 10 MG PO TABS: 10.0000 mg | ORAL_TABLET | Freq: Every day | ORAL | 0 refills | Status: DC

## 2016-05-30 NOTE — Assessment & Plan Note (Signed)
Refill Tramadol

## 2016-05-30 NOTE — Progress Notes (Signed)
   BP (!) 145/87 (BP Location: Left Arm, Cuff Size: Normal)   Pulse 80   Temp 98 F (36.7 C)   Wt 156 lb 12.8 oz (71.1 kg)   LMP  (LMP Unknown)   SpO2 97%   BMI 30.02 kg/m    Subjective:    Patient ID: Amy Hansen, female    DOB: [birth year], 60 y.o.   MRN: [id number]  HPI: [name] is a 60 y.o. female  Chief Complaint  Patient presents with  . Migraine    pt states she has had a continuous migraine since Thursday    Migraine Pt has a well known history of migraine headaches.  She is taking Tramadol and is out.  Pt states she has had a migraine and requires 8/month.  She has had a migraine 4/7 days.  She is having generalized tingling in her head but not one side or the other.  No assymetry in face.    Vitamin B12 deficiency Needs that checked today  Relevant past medical, surgical, family and social history reviewed and updated as indicated. Interim medical history since our last visit reviewed. Allergies and medications reviewed and updated.  Review of Systems  Per HPI unless specifically indicated above     Objective:    BP (!) 145/87 (BP Location: Left Arm, Cuff Size: Normal)   Pulse 80   Temp 98 F (36.7 C)   Wt 156 lb 12.8 oz (71.1 kg)   LMP  (LMP Unknown)   SpO2 97%   BMI 30.02 kg/m   Wt Readings from Last 3 Encounters:  05/30/16 156 lb 12.8 oz (71.1 kg)  03/21/16 153 lb 6.4 oz (69.6 kg)  02/21/16 156 lb 6.4 oz (70.9 kg)    Physical Exam  Constitutional: She is oriented to person, place, and time. She appears well-developed and well-nourished. No distress.  HENT:  Head: Normocephalic and atraumatic.  Eyes: Conjunctivae and lids are normal. Right eye exhibits no discharge. Left eye exhibits no discharge. No scleral icterus.  Neck: Normal range of motion. Neck supple. No JVD present. Carotid bruit is not present.  Cardiovascular: Normal rate, regular rhythm and normal heart sounds.   Pulmonary/Chest: Effort normal and breath sounds normal.    Abdominal: Normal appearance. There is no splenomegaly or hepatomegaly.  Musculoskeletal: Normal range of motion.  Neurological: She is alert and oriented to person, place, and time. She has normal strength. No cranial nerve deficit or sensory deficit. She displays a negative Romberg sign.  Skin: Skin is warm, dry and intact. No rash noted. No pallor.  Psychiatric: She has a normal mood and affect. Her behavior is normal. Judgment and thought content normal.    Results for orders placed or performed in visit on 03/21/16  Hemoglobin A1c  Result Value Ref Range   Hemoglobin A1C 7.3%       Assessment & Plan:   Problem List Items Addressed This Visit      Unprioritized   Migraines    Refill Tramadol      Relevant Medications   traMADol (ULTRAM) 50 MG tablet    Other Visit Diagnoses    Need for influenza vaccination    -  Primary   Relevant Orders   Flu Vaccine QUAD 36+ mos IM (Completed)       Follow up plan: Return in about 4 weeks (around 06/27/2016) for blood sugar.

## 2016-05-30 NOTE — Patient Instructions (Signed)

## 2016-06-12 ENCOUNTER — Telehealth: Payer: Self-pay | Admitting: Unknown Physician Specialty

## 2016-06-12 NOTE — Telephone Encounter (Signed)
Called pt to notify that she needed to call 911. She didn't say she would call only said ok and hung up.

## 2016-06-12 NOTE — Telephone Encounter (Signed)
Please tell her to call 911

## 2016-06-12 NOTE — Telephone Encounter (Signed)
Christian placed the patient on hold and called me. I advised that patient needed to go to the ER. Cheryl walked by and I asked her. Malachy Mood stated that the patient needed to go to the ER. Will route to [place] for documentation.

## 2016-06-12 NOTE — Telephone Encounter (Signed)
Pt called and stated that she felt like she was having a stroke. She stated that her head/face was numb, she was having sharp stabbing pains on the right side of her head(front and back), and she felt like she was going to fall whenever she tried to stand or walk. I advised the pt to go to the ED and asked the pt if she had a ride to go to the ED and she stated that she would go to [facility] if she went but she's scared and may not go.

## 2016-06-18 ENCOUNTER — Ambulatory Visit (INDEPENDENT_AMBULATORY_CARE_PROVIDER_SITE_OTHER): Payer: Managed Care, Other (non HMO) | Admitting: Unknown Physician Specialty

## 2016-06-18 ENCOUNTER — Encounter: Payer: Self-pay | Admitting: Unknown Physician Specialty

## 2016-06-18 VITALS — BP 137/83 | HR 87 | Temp 97.9°F | Wt 157.0 lb

## 2016-06-18 DIAGNOSIS — M7711 Lateral epicondylitis, right elbow: Secondary | ICD-10-CM | POA: Diagnosis not present

## 2016-06-18 DIAGNOSIS — G4485 Primary stabbing headache: Secondary | ICD-10-CM | POA: Diagnosis not present

## 2016-06-18 DIAGNOSIS — G43009 Migraine without aura, not intractable, without status migrainosus: Secondary | ICD-10-CM | POA: Diagnosis not present

## 2016-06-18 NOTE — Assessment & Plan Note (Signed)
Refer to neurology as also has migraines

## 2016-06-18 NOTE — Patient Instructions (Addendum)
Tennis Elbow Rehab Ask your health care provider which exercises are safe for you. Do exercises exactly as told by your health care provider and adjust them as directed. It is normal to feel mild stretching, pulling, tightness, or discomfort as you do these exercises, but you should stop right away if you feel sudden pain or your pain gets worse. Do not begin these exercises until told by your health care provider. Stretching and range of motion exercises These exercises warm up your muscles and joints and improve the movement and flexibility of your elbow. These exercises also help to relieve pain, numbness, and tingling. Exercise A: Wrist extensor stretch 1. Extend your left / right elbow with your fingers pointing down. 2. Gently pull the palm of your left / right hand toward you until you feel a gentle stretch on the top of your forearm. 3. To increase the stretch, push your left / right hand toward the outer edge or pinkie side of your forearm. 4. Hold this position for __________ seconds. Repeat __________ times. Complete this exercise __________ times a day. If directed by your health care provider, repeat this stretch except do it with a bent elbow this time. Exercise B: Wrist flexor stretch 1. Extend your left / right elbow and turn your palm upward. 2. Gently pull your left / right palm and fingertips back so your wrist extends and your fingers point more toward the ground. 3. You should feel a gentle stretch on the inside of your forearm. 4. Hold this position for __________ seconds. Repeat __________ times. Complete this exercise __________ times a day. If directed by your health care provider, repeat this stretch except do it with a bent elbow this time. Strengthening exercises These exercises build strength and endurance in your elbow. Endurance is the ability to use your muscles for a long time, even after they get tired. Exercise C: Wrist extensors 1. Sit with your left / right  forearm palm-down and fully supported on a table or countertop. Your elbow should be resting below the height of your shoulder. 2. Let your left / right wrist extend over the edge of the surface. 3. Loosely hold a __________ weight or a piece of rubber exercise band or tubing in your left / right hand. Slowly curl your left / right hand up toward your forearm. If you are using band or tubing, hold the band or tubing in place with your other hand to provide resistance. 4. Hold this position for __________ seconds. 5. Slowly return to the starting position. Repeat __________ times. Complete this exercise __________ times a day. Exercise D: Radial deviators 1. Stand with a __________ weight in your left / righthand. Or, sit while holding a rubber exercise band or tubing with your other arm supported on a table or countertop. Position your hand so your thumb is on top. 2. Raise your hand upward in front of you so your thumb travels toward your forearm, or pull up on the rubber tubing. 3. Hold this position for __________ seconds. 4. Slowly return to the starting position. Repeat __________ times. Complete this exercise __________ times a day. Exercise E: Eccentric wrist extensors 1. Sit with your left / right forearm palm-down and fully supported on a table or countertop. Your elbow should be resting below the height of your shoulder. 2. If told by your health care provider, hold a __________ weight in your hand. 3. Let your left / right wrist extend over the edge of the surface. 4.  Use your other hand to lift up your left / right hand toward your forearm. Keep your forearm on the table. 5. Using only the muscles in your left / right hand, slowly lower your hand back down to the starting position. Repeat __________ times. Complete this exercise __________ times a day. This information is not intended to replace advice given to you by your health care provider. Make sure you discuss any questions you  have with your health care provider. Document Released: 07/16/2005 Document Revised: 03/21/2016 Document Reviewed: 04/14/2015 Elsevier Interactive Patient Education  2017 [employer]  Tennis Elbow Tennis elbow (lateral epicondylitis) is inflammation of the outer tendons of your forearm close to your elbow. Your tendons attach your muscles to your bones. The outer tendons of your forearm are used to extend your wrist, and they attach on the outside part of your elbow. Tennis elbow is often found in people who play tennis, but anyone may get the condition from repeatedly extending the wrist or turning the forearm. What are the causes? This condition is caused by repeatedly extending your wrist and using your hands. It can result from sports or work that requires repetitive forearm movements. Tennis elbow may also be caused by an injury. What increases the risk? You have a higher risk of developing tennis elbow if you play tennis or another racquet sport. You also have a higher risk if you frequently use your hands for work. This condition is also more likely to develop in:  Musicians.  Carpenters, painters, and plumbers.  Cooks.  Cashiers.  People who work in [employer].  Architect workers.  Butchers.  People who use computers. What are the signs or symptoms? Symptoms of this condition include:  Pain and tenderness in your forearm and the outer part of your elbow. You may only feel the pain when you use your arm, or you may feel it even when you are not using your arm.  A burning feeling that runs from your elbow through your arm.  Weak grip in your hands. How is this diagnosed? This condition may be diagnosed by medical history and physical exam. You may also have other tests, including:  X-rays.  MRI. How is this treated? Your health care provider will recommend lifestyle adjustments, such as resting and icing your arm. Treatment may also include:  Medicines for  inflammation. This may include shots of cortisone if your pain continues.  Physical therapy. This may include massage or exercises.  An elbow brace. Surgery may eventually be recommended if your pain does not go away with treatment. Follow these instructions at home: Activity  Rest your elbow and wrist as directed by your health care provider. Try to avoid any activities that caused the problem until your health care provider says that you can do them again.  If a physical therapist teaches you exercises, do all of them as directed.  If you lift an object, lift it with your palm facing upward. This lowers the stress on your elbow. Lifestyle  If your tennis elbow is caused by sports, check your equipment and make sure that:  You are using it correctly.  It is the best fit for you.  If your tennis elbow is caused by work, take breaks frequently, if you are able. Talk with your manager about how to best perform tasks in a way that is safe.  If your tennis elbow is caused by computer use, talk with your manager about any changes that can be made to  your work environment. General instructions  If directed, apply ice to the painful area:  Put ice in a plastic bag.  Place a towel between your skin and the bag.  Leave the ice on for 20 minutes, 2-3 times per day.  Take medicines only as directed by your health care provider.  If you were given a brace, wear it as directed by your health care provider.  Keep all follow-up visits as directed by your health care provider. This is important. Contact a health care provider if:  Your pain does not get better with treatment.  Your pain gets worse.  You have numbness or weakness in your forearm, hand, or fingers. This information is not intended to replace advice given to you by your health care provider. Make sure you discuss any questions you have with your health care provider. Document Released: 07/16/2005 Document Revised:  03/15/2016 Document Reviewed: 07/12/2014 Elsevier Interactive Patient Education  2017 [employer].

## 2016-06-18 NOTE — Assessment & Plan Note (Signed)
Tennis elbow brace and stretching

## 2016-06-18 NOTE — Progress Notes (Signed)
BP 137/83 (BP Location: Left Arm, Patient Position: Sitting, Cuff Size: Normal)   Pulse 87   Temp 97.9 F (36.6 C)   Wt 157 lb (71.2 kg)   LMP  (LMP Unknown)   SpO2 97%   BMI 30.06 kg/m    Subjective:    Patient ID: Amy Hansen, female    DOB: Oct 13, 1955, 60 y.o.   MRN: [id number]  HPI: Amy Hansen is a 60 y.o. female  Chief Complaint  Patient presents with  . Headache    pt states she has a stabbing like pain on right side of her head that started about 2 weeks ago  . Arm Pain    pt states she has radiating pain down her right arm, states it started about 3 to 4 weeks ago   . Referral    pt states she would like a neurology referral to see Dr. Melrose Nakayama    Pt states she is having sharp stabbing pain right side of head.  No real pattern.  No nausea.  She does have a history of migraines.  Pt without any speech or movement disorders.    Also having radiating pain from wrist area up through arm but not to neck.    Since she already has migraines, she would like a referral to neurology  Relevant past medical, surgical, family and social history reviewed and updated as indicated. Interim medical history since our last visit reviewed. Allergies and medications reviewed and updated.  Review of Systems  Per HPI unless specifically indicated above     Objective:    BP 137/83 (BP Location: Left Arm, Patient Position: Sitting, Cuff Size: Normal)   Pulse 87   Temp 97.9 F (36.6 C)   Wt 157 lb (71.2 kg)   LMP  (LMP Unknown)   SpO2 97%   BMI 30.06 kg/m   Wt Readings from Last 3 Encounters:  06/18/16 157 lb (71.2 kg)  05/30/16 156 lb 12.8 oz (71.1 kg)  03/21/16 153 lb 6.4 oz (69.6 kg)    Physical Exam  Constitutional: She is oriented to person, place, and time. She appears well-developed and well-nourished. No distress.  HENT:  Head: Normocephalic and atraumatic.  Eyes: Conjunctivae and lids are normal. Right eye exhibits no discharge. Left eye exhibits no  discharge. No scleral icterus.  Neck: Normal range of motion. Neck supple. No JVD present. Carotid bruit is not present.  Cardiovascular: Normal rate, regular rhythm and normal heart sounds.   Pulmonary/Chest: Effort normal and breath sounds normal.  Abdominal: Normal appearance. There is no splenomegaly or hepatomegaly.  Musculoskeletal: Normal range of motion.       Right elbow: She exhibits normal range of motion, no swelling, no effusion, no deformity and no laceration. Tenderness found.  Tenderness lateral epicondyle  Neurological: She is alert and oriented to person, place, and time. She has normal strength and normal reflexes. No cranial nerve deficit or sensory deficit. She displays a negative Romberg sign.  Skin: Skin is warm, dry and intact. No rash noted. No pallor.  Psychiatric: She has a normal mood and affect. Her behavior is normal. Judgment and thought content normal.    Results for orders placed or performed in visit on 03/21/16  Hemoglobin A1c  Result Value Ref Range   Hemoglobin A1C 7.3%       Assessment & Plan:   Problem List Items Addressed This Visit      Unprioritized   Epicondylitis, lateral, right  Tennis elbow brace and stretching      Migraines - Primary   Relevant Medications   lamoTRIgine (LAMICTAL) 200 MG tablet   Other Relevant Orders   Ambulatory referral to Neurology   Stabbing headache    Refer to neurology as also has migraines      Relevant Medications   lamoTRIgine (LAMICTAL) 200 MG tablet   Other Relevant Orders   Ambulatory referral to Neurology       Follow up plan: Return if symptoms worsen or fail to improve.

## 2016-06-26 ENCOUNTER — Ambulatory Visit: Payer: Managed Care, Other (non HMO) | Admitting: Unknown Physician Specialty

## 2016-07-17 ENCOUNTER — Encounter: Payer: Self-pay | Admitting: Unknown Physician Specialty

## 2016-07-17 ENCOUNTER — Ambulatory Visit (INDEPENDENT_AMBULATORY_CARE_PROVIDER_SITE_OTHER): Payer: Managed Care, Other (non HMO) | Admitting: Unknown Physician Specialty

## 2016-07-17 VITALS — BP 138/85 | HR 71 | Temp 98.5°F | Wt 161.4 lb

## 2016-07-17 DIAGNOSIS — I10 Essential (primary) hypertension: Secondary | ICD-10-CM

## 2016-07-17 DIAGNOSIS — M7711 Lateral epicondylitis, right elbow: Secondary | ICD-10-CM | POA: Diagnosis not present

## 2016-07-17 DIAGNOSIS — G43009 Migraine without aura, not intractable, without status migrainosus: Secondary | ICD-10-CM | POA: Diagnosis not present

## 2016-07-17 DIAGNOSIS — M7989 Other specified soft tissue disorders: Secondary | ICD-10-CM

## 2016-07-17 DIAGNOSIS — E538 Deficiency of other specified B group vitamins: Secondary | ICD-10-CM | POA: Diagnosis not present

## 2016-07-17 DIAGNOSIS — E1165 Type 2 diabetes mellitus with hyperglycemia: Secondary | ICD-10-CM

## 2016-07-17 LAB — LIPID PANEL PICCOLO, WAIVED
CHOL/HDL RATIO PICCOLO,WAIVE: 2.8 mg/dL
Cholesterol Piccolo, Waived: 139 mg/dL (ref <200)
HDL Chol Piccolo, Waived: 54 mg/dL — ABNORMAL LOW (ref >59)
LDL CHOL CALC PICCOLO WAIVED: 65 mg/dL (ref <100)
TRIGLYCERIDES PICCOLO,WAIVED: 99 mg/dL (ref <150)
VLDL Chol Calc Piccolo,Waive: 20 mg/dL (ref <30)

## 2016-07-17 LAB — BAYER DCA HB A1C WAIVED: HB A1C: 7.6 % — AB (ref <7.0)

## 2016-07-17 LAB — MICROALBUMIN, URINE WAIVED
CREATININE, URINE WAIVED: 10 mg/dL (ref 10–300)
MICROALB, UR WAIVED: 10 mg/L (ref 0–19)
Microalb/Creat Ratio: <30 mg/g (ref <30)

## 2016-07-17 MED ORDER — DICLOFENAC SODIUM 1 % TD GEL: 4.0000 g | Freq: Four times a day (QID) | TRANSDERMAL | 12 refills | Status: DC

## 2016-07-17 MED ORDER — FUROSEMIDE 20 MG PO TABS: 20.0000 mg | ORAL_TABLET | Freq: Every day | ORAL | 1 refills | Status: DC

## 2016-07-17 NOTE — Progress Notes (Signed)
BP 138/85 (BP Location: Left Arm, Patient Position: Sitting, Cuff Size: Large)   Pulse 71   Temp 98.5 F (36.9 C)   Wt 161 lb 6.4 oz (73.2 kg)   LMP  (LMP Unknown)   SpO2 98%   BMI 30.90 kg/m    Subjective:    Patient ID: [name], female    DOB: 11-05-1955, 60 y.o.   MRN: [id number]  HPI: DISA RIEDLINGER is a 60 y.o. female  Chief Complaint  Patient presents with  . Follow-up    4 week f/up  . Foot Swelling    pt states she has bilateral feet swelling that started about a month ago, but states it got very bad Friday, Saturday, and Sunday. States she went to urgent care Sunday and was given 3 days worth of 20 mg furosemide to get to this appointment.   . Diabetes    pt states she had eye exam earlier in the year, will fax form to Dr. Gloriann Loan   Migraine Appt with Dr. Melrose Nakayama conflicting with work.  She plans to reschedule.    Feet swelling States she has swelling in her feet that goes down at night.  She went to urgent care and received Furosemide.  It is better.  Originally, she even had bruising on right foot.    Diabetes: Using medications without difficulties No hypoglycemic episodes No hyperglycemic episodes Feet problems:see above Blood Sugars averaging: Not checking eye exam within last year Last Hgb A1C: 7.3  Hypertension  Using medications without difficulty Average home BPs not checking   Using medication without problems or lightheadedness No chest pain with exertion or shortness of breath No Edema  Elevated Cholesterol Using medications without problems No Muscle aches  Diet/Exercise: "not great"  Relevant past medical, surgical, family and social history reviewed and updated as indicated. Interim medical history since our last visit reviewed. Allergies and medications reviewed and updated.  Review of Systems  Per HPI unless specifically indicated above     Objective:    BP 138/85 (BP Location: Left Arm, Patient Position: Sitting, Cuff  Size: Large)   Pulse 71   Temp 98.5 F (36.9 C)   Wt 161 lb 6.4 oz (73.2 kg)   LMP  (LMP Unknown)   SpO2 98%   BMI 30.90 kg/m   Wt Readings from Last 3 Encounters:  07/17/16 161 lb 6.4 oz (73.2 kg)  06/18/16 157 lb (71.2 kg)  05/30/16 156 lb 12.8 oz (71.1 kg)    Physical Exam  Constitutional: She is oriented to person, place, and time. She appears well-developed and well-nourished. No distress.  HENT:  Head: Normocephalic and atraumatic.  Eyes: Conjunctivae and lids are normal. Right eye exhibits no discharge. Left eye exhibits no discharge. No scleral icterus.  Neck: Normal range of motion. Neck supple. No JVD present. Carotid bruit is not present.  Cardiovascular: Normal rate, regular rhythm and normal heart sounds.   Pulmonary/Chest: Effort normal and breath sounds normal.  Abdominal: Normal appearance. There is no splenomegaly or hepatomegaly.  Musculoskeletal: Normal range of motion.  Neurological: She is alert and oriented to person, place, and time.  Skin: Skin is warm, dry and intact. No rash noted. No pallor.  Psychiatric: She has a normal mood and affect. Her behavior is normal. Judgment and thought content normal.    Results for orders placed or performed in visit on 03/21/16  Hemoglobin A1c  Result Value Ref Range   Hemoglobin A1C 7.3%  Assessment & Plan:   Problem List Items Addressed This Visit      Unprioritized   Epicondylitis, lateral, right    This is bothering her.  I suspect the Ibuprofen is causing the swelling      Foot swelling    She saw a cardilologist 10 years ago.  needs an echocardiogram.  Stop Ibuprofen she is taking for her arm.  Wean off Ibuprofen      Relevant Orders   Ambulatory referral to Cardiology   Hypertension   Relevant Medications   furosemide (LASIX) 20 MG tablet   Other Relevant Orders   Comprehensive metabolic panel   Lipid Panel [place], Waived   Uric acid   Migraines    Pending visit with neurology       Relevant Medications   furosemide (LASIX) 20 MG tablet   Poorly controlled type 2 diabetes mellitus (HCC) - Primary    Hgb A1C is 7.6.  She plans to work on diet      Relevant Orders   [facility] [place] Hb A1c Waived   Comprehensive metabolic panel   Microalbumin, Urine Waived       Follow up plan: Return in about 3 months (around 10/15/2016).

## 2016-07-17 NOTE — Assessment & Plan Note (Signed)
>>  ASSESSMENT AND PLAN FOR DIABETES MELLITUS TYPE 2 IN NONOBESE (HCC) WRITTEN ON 07/17/2016  2:35 PM BY WICKER, CHERYL, NP  Hgb A1C is 7.6.  She plans to work on diet

## 2016-07-17 NOTE — Assessment & Plan Note (Signed)
Pending visit with neurology

## 2016-07-17 NOTE — Assessment & Plan Note (Signed)
Hgb A1C is 7.6.  She plans to work on diet

## 2016-07-17 NOTE — Assessment & Plan Note (Addendum)
She saw a cardilologist 10 years ago.  needs an echocardiogram.  Stop Ibuprofen she is taking for her arm.  Wean off Ibuprofen

## 2016-07-17 NOTE — Assessment & Plan Note (Signed)
This is bothering her.  I suspect the Ibuprofen is causing the swelling

## 2016-07-18 LAB — COMPREHENSIVE METABOLIC PANEL
A/G RATIO: 1.6 (ref 1.2–2.2)
ALT: 26 IU/L (ref 0–32)
AST: 22 IU/L (ref 0–40)
Albumin: 3.9 g/dL (ref 3.5–5.5)
Alkaline Phosphatase: 145 IU/L — ABNORMAL HIGH (ref 39–117)
BUN/Creatinine Ratio: 11 (ref 9–23)
BUN: 7 mg/dL (ref 6–24)
CHLORIDE: 98 mmol/L (ref 96–106)
CO2: 27 mmol/L (ref 18–29)
Calcium: 8.9 mg/dL (ref 8.7–10.2)
Creatinine, Ser: 0.64 mg/dL (ref 0.57–1.00)
GFR calc non Af Amer: 98 mL/min/{1.73_m2} (ref >59)
GFR, EST AFRICAN AMERICAN: 113 mL/min/{1.73_m2} (ref >59)
Globulin, Total: 2.4 g/dL (ref 1.5–4.5)
Glucose: 166 mg/dL — ABNORMAL HIGH (ref 65–99)
POTASSIUM: 3.6 mmol/L (ref 3.5–5.2)
Sodium: 140 mmol/L (ref 134–144)
TOTAL PROTEIN: 6.3 g/dL (ref 6.0–8.5)

## 2016-07-18 LAB — URIC ACID: Uric Acid: 3.4 mg/dL (ref 2.5–7.1)

## 2016-07-25 ENCOUNTER — Other Ambulatory Visit: Payer: Self-pay

## 2016-07-26 MED ORDER — FUROSEMIDE 20 MG PO TABS: 20.0000 mg | ORAL_TABLET | Freq: Every day | ORAL | 1 refills | Status: DC

## 2016-07-27 ENCOUNTER — Telehealth: Payer: Self-pay | Admitting: Unknown Physician Specialty

## 2016-07-27 NOTE — Telephone Encounter (Signed)
Microbid patient needs a refill  [facility]

## 2016-08-02 ENCOUNTER — Ambulatory Visit (INDEPENDENT_AMBULATORY_CARE_PROVIDER_SITE_OTHER): Payer: Managed Care, Other (non HMO) | Admitting: Cardiology

## 2016-08-02 ENCOUNTER — Encounter: Payer: Self-pay | Admitting: Cardiology

## 2016-08-02 VITALS — BP 112/85 | HR 85 | Ht 62.0 in | Wt 159.5 lb

## 2016-08-02 DIAGNOSIS — R609 Edema, unspecified: Secondary | ICD-10-CM | POA: Diagnosis not present

## 2016-08-02 DIAGNOSIS — R0602 Shortness of breath: Secondary | ICD-10-CM

## 2016-08-02 DIAGNOSIS — Z136 Encounter for screening for cardiovascular disorders: Secondary | ICD-10-CM | POA: Diagnosis not present

## 2016-08-02 NOTE — Patient Instructions (Addendum)
Medication Instructions:  Your physician has recommended you make the following change in your medication:  1. Take Lasix for one more week.   Labwork: Labs today and I will call you with results.   Testing/Procedures: Your physician has requested that you have an echocardiogram. Echocardiography is a painless test that uses sound waves to create images of your heart. It provides your doctor with information about the size and shape of your heart and how well your heart's chambers and valves are working. This procedure takes approximately one hour. There are no restrictions for this procedure.  [place]  Your caregiver has ordered a Stress Test with nuclear imaging. The purpose of this test is to evaluate the blood supply to your heart muscle. This procedure is referred to as a "Non-Invasive Stress Test." This is because other than having an IV started in your vein, nothing is inserted or "invades" your body. Cardiac stress tests are done to find areas of poor blood flow to the heart by determining the extent of coronary artery disease (CAD). Some patients exercise on a treadmill, which naturally increases the blood flow to your heart, while others who are  unable to walk on a treadmill due to physical limitations have a pharmacologic/chemical stress agent called Lexiscan . This medicine will mimic walking on a treadmill by temporarily increasing your coronary blood flow.   Please note: these test may take anywhere between 2-4 hours to complete  PLEASE REPORT TO [place] AT THE FIRST DESK WILL DIRECT YOU WHERE TO GO  Date of Procedure:_Thursday, August 16, 2016 at 89:30AM _  Arrival Time for Procedure:__Arrive at 07:15AM to register_  Instructions regarding medication:   __X__ : Hold diabetes medication Metformin (Glucophage) the morning of procedure    PLEASE NOTIFY THE OFFICE AT LEAST 24 HOURS IN ADVANCE IF YOU ARE UNABLE TO KEEP YOUR APPOINTMENT.   [phone] AND  PLEASE NOTIFY NUCLEAR MEDICINE AT [facility] AT LEAST 24 HOURS IN ADVANCE IF YOU ARE UNABLE TO KEEP YOUR APPOINTMENT. [phone]  How to prepare for your Myoview test:  1. Do not eat or drink after midnight 2. No caffeine for 24 hours prior to test 3. No smoking 24 hours prior to test. 4. Your medication may be taken with water.  If your doctor stopped a medication because of this test, do not take that medication. 5. Ladies, please do not wear dresses.  Skirts or pants are appropriate. Please wear a short sleeve shirt. 6. No perfume, cologne or lotion. 7. Wear comfortable walking shoes. No heels!   Follow-Up: Your physician recommends that you schedule a follow-up appointment after testing with Dr. Yvone Neu.   It was a pleasure seeing you today here in the office. Please do not hesitate to give [name] a call back if you have any further questions. [place], BSN    Echocardiogram An echocardiogram, or echocardiography, uses sound waves (ultrasound) to produce an image of your heart. The echocardiogram is simple, painless, obtained within a short period of time, and offers valuable information to your health care provider. The images from an echocardiogram can provide information such as:  Evidence of coronary artery disease (CAD).  Heart size.  Heart muscle function.  Heart valve function.  Aneurysm detection.  Evidence of a past heart attack.  Fluid buildup around the heart.  Heart muscle thickening.  Assess heart valve function. Tell a health care provider about:  Any allergies you have.  All  medicines you are taking, including vitamins, herbs, eye drops, creams, and over-the-counter medicines.  Any problems you or family members have had with anesthetic medicines.  Any blood disorders you have.  Any surgeries you have had.  Any medical conditions you have.  Whether you are pregnant or may be pregnant. What happens before the  procedure? No special preparation is needed. Eat and drink normally. What happens during the procedure?  In order to produce an image of your heart, gel will be applied to your chest and a wand-like tool (transducer) will be moved over your chest. The gel will help transmit the sound waves from the transducer. The sound waves will harmlessly bounce off your heart to allow the heart images to be captured in real-time motion. These images will then be recorded.  You may need an IV to receive a medicine that improves the quality of the pictures. What happens after the procedure? You may return to your normal schedule including diet, activities, and medicines, unless your health care provider tells you otherwise. This information is not intended to replace advice given to you by your health care provider. Make sure you discuss any questions you have with your health care provider. Document Released: 07/13/2000 Document Revised: 03/03/2016 Document Reviewed: 03/23/2013 Elsevier Interactive Patient Education  2017 [place]. Cardiac Nuclear Scanning A cardiac nuclear scan is used to check your heart for problems, such as the following:  A portion of the heart is not getting enough blood.  Part of the heart muscle has died, which happens with a heart attack.  The heart wall is not working normally.  In this test, a radioactive dye (tracer) is injected into your bloodstream. After the tracer has traveled to your heart, a scanning device is used to measure how much of the tracer is absorbed by or distributed to various areas of your heart. LET [facility] CARE PROVIDER KNOW ABOUT:  Any allergies you have.  All medicines you are taking, including vitamins, herbs, eye drops, creams, and over-the-counter medicines.  Previous problems you or members of your family have had with the use of anesthetics.  Any blood disorders you have.  Previous surgeries you have had.  Medical conditions you  have.  RISKS AND COMPLICATIONS Generally, this is a safe procedure. However, as with any procedure, problems can occur. Possible problems include:   Serious chest pain.  Rapid heartbeat.  Sensation of warmth in your chest. This usually passes quickly. BEFORE THE PROCEDURE Ask your health care provider about changing or stopping your regular medicines. PROCEDURE This procedure is usually done at a hospital and takes 2-4 hours.  An IV tube is inserted into one of your veins.  Your health care provider will inject a small amount of radioactive tracer through the tube.  You will then wait for 20-40 minutes while the tracer travels through your bloodstream.  You will lie down on an exam table so images of your heart can be taken. Images will be taken for about 15-20 minutes.  You will exercise on a treadmill or stationary bike. While you exercise, your heart activity will be monitored with an electrocardiogram (ECG), and your blood pressure will be checked.  If you are unable to exercise, you may be given a medicine to make your heart beat faster.  When blood flow to your heart has peaked, tracer will again be injected through the IV tube.  After 20-40 minutes, you will get back on the exam table and have more images  taken of your heart.  When the procedure is over, your IV tube will be removed. AFTER THE PROCEDURE  You will likely be able to leave shortly after the test. Unless your health care provider tells you otherwise, you may return to your normal schedule, including diet, activities, and medicines.  Make sure you find out how and when you will get your test results. This information is not intended to replace advice given to you by your health care provider. Make sure you discuss any questions you have with your health care provider. Document Released: 08/10/2004 Document Revised: 07/21/2013 Document Reviewed: 06/24/2013 Elsevier Interactive Patient Education  2017 [employer].

## 2016-08-02 NOTE — Progress Notes (Signed)
Cardiology Office Note   Date:  08/02/2016   ID:  Amy Hansen, DOB 1956-07-03, MRN [id number]  Referring Doctor:  Kathrine Haddock, NP   Cardiologist:   Wende Bushy, MD   Reason for consultation:  Chief Complaint  Patient presents with  . other    C/o back pain radiates down left arm, sob and edema. Meds reviewed verbally with pt.      History of Present Illness: Amy Hansen is a 61 y.o. female who presents for leg swelling. Going on for 2 months. Significantly progressed over time. When it was at its worse, she developed some bruising over the dorsum of the right foot. There was significant discomfort at that time as well. She presented to urgent care and was given Lasix for 3 days. She followed up with PCP we'll continue the Lasix. With the Lasix, she has noted improvement in the swelling almost down to baseline. Swelling mainly in the feet and ankles and lower legs. Intensity was severe but improving now with the diuretics. Her PCP  Has mentioned the possibility that this was related to her increased NSAID use. She was using NSAIDs for elbow pain. Patient also admits to increased sodium intake.  Patient also complains of shortness of breath. This has been going on for quite some time now. Slightly progressed over time. No associated chest discomfort or chest pain. She has not noticed improvement with iuretic use.  Patient also complains of back pain that radiates down to the left underside of the arm This pain is moderate in intensity, lasting a few minutes at a time, precipitated by movements of the torso. Thi This is not related to the shortness of breath.  Patient denies palpitations, PND, orthopnea, loss of consciousness.   ROS:  Please see the history of present illness. Aside from mentioned under HPI, all other systems are reviewed and negative.     Past Medical History:  Diagnosis Date  . Anemia   . Anxiety   . Chronic kidney disease    UTI, hematuria in urine    . Depression   . Diabetes ([place])   . Diverticulosis   . Frequent headaches   . Recurrent UTI   . Restless leg syndrome   . Urinary frequency     Past Surgical History:  Procedure Laterality Date  . ABDOMINAL HYSTERECTOMY    . CARPAL TUNNEL RELEASE Right 2003  . CHOLECYSTECTOMY  1975  . CYSTOSCOPY W/ RETROGRADES Bilateral 06/06/2015   Procedure: CYSTOSCOPY WITH RETROGRADE PYELOGRAM;  Surgeon: Festus Aloe, MD;  Location: [facility] ORS;  Service: Urology;  Laterality: Bilateral;  . GASTRIC BYPASS  2010  . [place]  2013  . KNEE ARTHROSCOPY Left 1996  . TONSILLECTOMY       reports that she quit smoking about 41 years ago. Her smoking use included Cigarettes. She smoked 0.50 packs per day. She has never used smokeless tobacco. She reports that she does not drink alcohol or use drugs.   family history includes Heart failure in her sister; Stroke in her father.   Outpatient Medications Prior to Visit  Medication Sig Dispense Refill  . ALPRAZolam (XANAX) 0.5 MG tablet Take 0.5 mg by mouth at bedtime as needed for anxiety.    [name] buPROPion (WELLBUTRIN XL) 150 MG 24 hr tablet Take 150 mg by mouth daily.     . cyclobenzaprine (FLEXERIL) 10 MG tablet Take 1 tablet (10 mg total) by mouth at bedtime. 30 tablet 0  . diclofenac  sodium (VOLTAREN) 1 % GEL Apply 4 g topically 4 (four) times daily. Failed Ibuprofen 5 Tube 12  . Eszopiclone 3 MG TABS Take 3 mg by mouth at bedtime.     . Exenatide ER (BYDUREON) 2 MG PEN Inject 2 mg into the skin once a week. 12 each 3  . fluticasone (FLONASE) 50 MCG/ACT nasal spray Place 2 sprays into the nose as needed.     . furosemide (LASIX) 20 MG tablet Take 1 tablet (20 mg total) by mouth daily. 30 tablet 1  . lamoTRIgine (LAMICTAL) 200 MG tablet Take by mouth. Take 2 tablets daily at bedtime    . metFORMIN (GLUCOPHAGE) 1000 MG tablet Take 1 tablet (1,000 mg total) by mouth 2 (two) times daily with a meal. 180 tablet 3  . ONE TOUCH ULTRA TEST test strip  daily.    . pramipexole (MIRAPEX) 0.5 MG tablet Take 1 tablet (0.5 mg total) by mouth at bedtime. 90 tablet 3  . risperiDONE (RISPERDAL) 3 MG tablet Take 3 mg by mouth at bedtime.    . sertraline (ZOLOFT) 100 MG tablet Take 100 mg by mouth daily.    . traMADol (ULTRAM) 50 MG tablet Take 1 tablet (50 mg total) by mouth every 8 (eight) hours as needed. 40 tablet 1  . URELLE (URELLE/URISED) 81 MG TABS tablet Take 1 tablet (81 mg total) by mouth 4 (four) times daily. (Patient taking differently: Take 1 tablet by mouth every 6 (six) hours as needed. ) 120 each 0  . Vitamin D, Ergocalciferol, (DRISDOL) 50000 units CAPS capsule Take 1 capsule (50,000 Units total) by mouth every 7 (seven) days. 12 capsule 0  . zaleplon (SONATA) 10 MG capsule Take 10 mg by mouth daily as needed.    . prazosin (MINIPRESS) 1 MG capsule Take 1 mg by mouth at bedtime.     Facility-Administered Medications Prior to Visit  Medication Dose Route Frequency Provider Last Rate Last Dose  . cyanocobalamin ((VITAMIN B-12)) injection 1,000 mcg  1,000 mcg Intramuscular Q30 days Kathrine Haddock, NP   1,000 mcg at 07/17/16 1448     Allergies: Avelox [moxifloxacin hcl in nacl] and Stadol [butorphanol]    PHYSICAL EXAM: VS:  BP 112/85 (BP Location: Right Arm, Patient Position: Sitting, Cuff Size: Normal)   Pulse 85   Ht 5' 2"  (1.575 m)   Wt 159 lb 8 oz (72.3 kg)   LMP  (LMP Unknown)   BMI 29.17 kg/m  , Body mass index is 29.17 kg/m. Wt Readings from Last 3 Encounters:  08/02/16 159 lb 8 oz (72.3 kg)  07/17/16 161 lb 6.4 oz (73.2 kg)  06/18/16 157 lb (71.2 kg)    GENERAL:  well developed, well nourished,  not in acute distress HEENT: normocephalic, pink conjunctivae, anicteric sclerae, no xanthelasma, normal dentition, oropharynx clear NECK:  no neck vein engorgement, JVP normal, no hepatojugular reflux, carotid upstroke brisk and symmetric, no bruit, no thyromegaly, no lymphadenopathy LUNGS:  good respiratory effort, clear to  auscultation bilaterally CV:  PMI not displaced, no thrills, no lifts, S1 and S2 within normal limits, no palpable S3 or S4, no murmurs, no rubs, no gallops ABD:  Soft, nontender, nondistended, normoactive bowel sounds, no abdominal aortic bruit, no hepatomegaly, no splenomegaly MS: nontender back, no kyphosis, no scoliosis, no joint deformities EXT:  2+ DP/PT pulses, +-++ edema, no varicosities, no cyanosis, no clubbing SKIN: warm, nondiaphoretic, normal turgor, no ulcers NEUROPSYCH: alert, oriented to person, place, and time, sensory/motor grossly intact, normal  mood, appropriate affect  Recent Labs: 07/17/2016: ALT 26; BUN 7; Creatinine, Ser 0.64; Potassium 3.6; Sodium 140   Lipid Panel    Component Value Date/Time   CHOL 139 07/17/2016 1408   CHOL 148 01/10/2012 0717   TRIG 99 07/17/2016 1408   TRIG 110 01/10/2012 0717   HDL 47 01/10/2012 0717   VLDL 20 07/17/2016 1408   VLDL 22 01/10/2012 0717   LDLCALC 79 01/10/2012 0717     Other studies Reviewed:  EKG:  The ekg from1/10/2016 was personally reviewed by me and it revealed sinus rhythm, 85 BPM.  Additional studies/ records that were reviewed personally reviewed by me today include: none available   ASSESSMENT AND PLAN:  leg swelling Shortness of breath These 2 symptoms may not be completely related. Legs swelling may be multifactorial, agree with relationship betw and leg swelling. Also, increased sodium intake maybe playing a significant role as well. Continue Lasix 20 mg daily times one more week. Check BMP today. Check echocardiogram Recommend to rule out ischemia with exercise nuclear stress test.  Back pain radiating to left arm This is not exertional in nature Likely musculoskeletal vs neuropathic Recommend to follow-up with PCP   Current medicines are reviewed at length with the patient today.  The patient does not have concerns regarding medicines.  Labs/ tests ordered today include:  Orders Placed This  Encounter  Procedures  . EKG 12-Lead    I had a lengthy and detailed discussion with the patient regarding diagnoses, prognosis, diagnostic options, treatment options , and side effects of medications.   I counseled the patient on importance of lifestyle modification including heart healthy diet, regular physical activity .   Disposition:   FU with undersigned after tests    Signed, Wende Bushy, MD  08/02/2016 2:28 PM    [place]  This note was generated in part with voice recognition software and I apologize for any typographical errors that were not detected and corrected.

## 2016-08-07 NOTE — Telephone Encounter (Signed)
ERROR

## 2016-08-14 LAB — BASIC METABOLIC PANEL
BUN/Creatinine Ratio: 14 (ref 9–23)
BUN: 11 mg/dL (ref 6–24)
CO2: 23 mmol/L (ref 18–29)
Calcium: 8.7 mg/dL (ref 8.7–10.2)
Chloride: 98 mmol/L (ref 96–106)
Creatinine, Ser: 0.77 mg/dL (ref 0.57–1.00)
GFR calc Af Amer: 98 mL/min/{1.73_m2} (ref >59)
GFR, EST NON AFRICAN AMERICAN: 85 mL/min/{1.73_m2} (ref >59)
GLUCOSE: 398 mg/dL — AB (ref 65–99)
Potassium: 3.9 mmol/L (ref 3.5–5.2)
Sodium: 136 mmol/L (ref 134–144)

## 2016-08-20 ENCOUNTER — Telehealth: Payer: Self-pay | Admitting: Cardiology

## 2016-08-20 NOTE — Telephone Encounter (Signed)
Spoke with patient and rescheduled stress test that had been canceled due to snow. Scheduled testing for 08/30/16 at 07:30AM to arrive at 07:15AM to register. Reviewed instructions and information for testing and she verbalized understanding with no further questions at this time.

## 2016-08-20 NOTE — Telephone Encounter (Signed)
Left voicemail message to call back so that we can reschedule her exercise myoview that was canceled due to snow.

## 2016-08-21 ENCOUNTER — Other Ambulatory Visit: Payer: Self-pay | Admitting: Unknown Physician Specialty

## 2016-08-22 ENCOUNTER — Encounter: Payer: Self-pay | Admitting: Unknown Physician Specialty

## 2016-08-23 ENCOUNTER — Other Ambulatory Visit: Payer: Self-pay

## 2016-08-23 ENCOUNTER — Ambulatory Visit (INDEPENDENT_AMBULATORY_CARE_PROVIDER_SITE_OTHER): Payer: Managed Care, Other (non HMO)

## 2016-08-23 DIAGNOSIS — R609 Edema, unspecified: Secondary | ICD-10-CM

## 2016-08-23 DIAGNOSIS — R0602 Shortness of breath: Secondary | ICD-10-CM

## 2016-08-23 NOTE — Progress Notes (Unsigned)
[place] [employer]

## 2016-08-29 ENCOUNTER — Telehealth: Payer: Self-pay | Admitting: Cardiology

## 2016-08-29 NOTE — Telephone Encounter (Signed)
Reviewed instructions for stress test scheduled tomorrow. She verbalized understanding with no further questions at this time.

## 2016-08-30 ENCOUNTER — Encounter
Admission: RE | Admit: 2016-08-30 | Discharge: 2016-08-30 | Disposition: A | Discharge status: Home / Self Care | Payer: Managed Care, Other (non HMO) | Source: Ambulatory Visit | Attending: Cardiology | Admitting: Cardiology

## 2016-08-30 ENCOUNTER — Encounter: Payer: Self-pay | Admitting: *Deleted

## 2016-08-30 ENCOUNTER — Ambulatory Visit: Payer: Managed Care, Other (non HMO) | Admitting: Cardiology

## 2016-08-30 DIAGNOSIS — R0602 Shortness of breath: Secondary | ICD-10-CM | POA: Insufficient documentation

## 2016-08-30 DIAGNOSIS — R609 Edema, unspecified: Secondary | ICD-10-CM | POA: Diagnosis not present

## 2016-08-30 LAB — NM MYOCAR MULTI W/SPECT W/WALL MOTION / EF
CHL CUP MPHR: 136 {beats}/min
CHL CUP NUCLEAR SDS: 5
CHL CUP NUCLEAR SRS: 0
CHL CUP RESTING HR STRESS: 65 {beats}/min
CHL CUP STRESS STAGE 1 SPEED: 0 mph
CHL CUP STRESS STAGE 2 HR: 77 {beats}/min
CHL CUP STRESS STAGE 2 SPEED: 0 mph
CHL CUP STRESS STAGE 3 DBP: 62 mmHg
CHL CUP STRESS STAGE 3 SBP: 133 mmHg
CHL CUP STRESS STAGE 3 SPEED: 1.7 mph
CHL CUP STRESS STAGE 4 DBP: 67 mmHg
CHL CUP STRESS STAGE 5 SPEED: 0 mph
CHL CUP STRESS STAGE 6 GRADE: 0 %
CHL CUP STRESS STAGE 6 HR: 90 {beats}/min
CSEPED: 5 min
CSEPEDS: 51 s
CSEPEW: 7 METS
CSEPHR: 1076 %
CSEPPMHR: 90 %
LV dias vol: 76 mL (ref 46–106)
LV sys vol: 30 mL
Peak BP: 153 mmHg
Peak HR: 146 {beats}/min
SSS: 5
Stage 1 Grade: 0 %
Stage 1 HR: 77 {beats}/min
Stage 2 Grade: 0 %
Stage 3 Grade: 10 %
Stage 3 HR: 122 {beats}/min
Stage 4 Grade: 12 %
Stage 4 HR: 146 {beats}/min
Stage 4 SBP: 153 mmHg
Stage 4 Speed: 2.5 mph
Stage 5 Grade: 0 %
Stage 5 HR: 122 {beats}/min
Stage 6 DBP: 59 mmHg
Stage 6 SBP: 132 mmHg
Stage 6 Speed: 0 mph
TID: 1.09

## 2016-08-30 MED ORDER — TECHNETIUM TC 99M TETROFOSMIN IV KIT
13.0000 | PACK | Freq: Once | INTRAVENOUS | Status: AC | PRN
  Administered 2016-08-30: 11.8 via INTRAVENOUS

## 2016-08-30 MED ORDER — TECHNETIUM TC 99M TETROFOSMIN IV KIT
31.8500 | PACK | Freq: Once | INTRAVENOUS | Status: AC | PRN
  Administered 2016-08-30: 31.85 via INTRAVENOUS

## 2016-09-06 ENCOUNTER — Ambulatory Visit (INDEPENDENT_AMBULATORY_CARE_PROVIDER_SITE_OTHER): Payer: Managed Care, Other (non HMO) | Admitting: Cardiology

## 2016-09-06 ENCOUNTER — Encounter: Payer: Self-pay | Admitting: Cardiology

## 2016-09-06 VITALS — BP 118/74 | HR 80 | Ht 62.0 in | Wt 161.2 lb

## 2016-09-06 DIAGNOSIS — I5033 Acute on chronic diastolic (congestive) heart failure: Secondary | ICD-10-CM

## 2016-09-06 DIAGNOSIS — R609 Edema, unspecified: Secondary | ICD-10-CM

## 2016-09-06 MED ORDER — FUROSEMIDE 20 MG PO TABS: 20.0000 mg | ORAL_TABLET | Freq: Every day | ORAL | 3 refills | Status: DC

## 2016-09-06 NOTE — Patient Instructions (Signed)
Medication Instructions:  Refills sent in for lasix  Labwork: Make sure to have BMP done.   Follow-Up: Your physician wants you to follow-up in: 6 months with Dr. Yvone Neu. You will receive a reminder letter in the mail two months in advance. If you don't receive a letter, please call our office to schedule the follow-up appointment.  It was a pleasure seeing you today here in the office. Please do not hesitate to give [name] a call back if you have any further questions. [place], BSN

## 2016-09-06 NOTE — Progress Notes (Signed)
Cardiology Office Note   Date:  09/06/2016   ID:  Amy Hansen, DOB 1956/06/06, MRN [id number]  Referring Doctor:  Kathrine Haddock, NP   Cardiologist:   Wende Bushy, MD   Reason for consultation:  Chief Complaint  Patient presents with  . OTHER    F/u echo and myoview. Meds reviewed verbally with pt.      History of Present Illness: Amy Hansen is a 61 y.o. female who presents for Follow-up after testing  Since starting Lasix, she has noticed improvement in her swelling. She would like to continue taking the medication. Shortness of breath has improved as well.  Patient continues to try to work on her diet patient admits to taking in a lot of salt in her food, pretzels.   Patient denies chest pain, shortness of breath is better, no PND, orthopnea  ROS:  Please see the history of present illness. Aside from mentioned under HPI, all other systems are reviewed and negative.    Past Medical History:  Diagnosis Date  . Anemia   . Anxiety   . Chronic kidney disease    UTI, hematuria in urine  . Depression   . Diabetes ([place])   . Diverticulosis   . Frequent headaches   . Recurrent UTI   . Restless leg syndrome   . Urinary frequency     Past Surgical History:  Procedure Laterality Date  . ABDOMINAL HYSTERECTOMY    . CARPAL TUNNEL RELEASE Right 2003  . CHOLECYSTECTOMY  1975  . CYSTOSCOPY W/ RETROGRADES Bilateral 06/06/2015   Procedure: CYSTOSCOPY WITH RETROGRADE PYELOGRAM;  Surgeon: Festus Aloe, MD;  Location: [facility] ORS;  Service: Urology;  Laterality: Bilateral;  . GASTRIC BYPASS  2010  . [place]  2013  . KNEE ARTHROSCOPY Left 1996  . TONSILLECTOMY       reports that she quit smoking about 41 years ago. Her smoking use included Cigarettes. She smoked 0.50 packs per day. She has never used smokeless tobacco. She reports that she does not drink alcohol or use drugs.   family history includes Heart failure in her sister; Stroke in her father.    Outpatient Medications Prior to Visit  Medication Sig Dispense Refill  . ALPRAZolam (XANAX) 0.5 MG tablet Take 0.5 mg by mouth at bedtime as needed for anxiety.    [name] buPROPion (WELLBUTRIN XL) 150 MG 24 hr tablet Take 150 mg by mouth daily.     . cyclobenzaprine (FLEXERIL) 10 MG tablet Take 1 tablet (10 mg total) by mouth at bedtime. 30 tablet 0  . diclofenac sodium (VOLTAREN) 1 % GEL Apply 4 g topically 4 (four) times daily. Failed Ibuprofen 5 Tube 12  . Eszopiclone 3 MG TABS Take 3 mg by mouth at bedtime.     . Exenatide ER (BYDUREON) 2 MG PEN Inject 2 mg into the skin once a week. 12 each 3  . fluticasone (FLONASE) 50 MCG/ACT nasal spray Place 2 sprays into the nose as needed.     . lamoTRIgine (LAMICTAL) 200 MG tablet Take by mouth. Take 2 tablets daily at bedtime    . metFORMIN (GLUCOPHAGE) 1000 MG tablet Take 1 tablet (1,000 mg total) by mouth 2 (two) times daily with a meal. 180 tablet 3  . ONE TOUCH ULTRA TEST test strip daily.    . pramipexole (MIRAPEX) 0.5 MG tablet Take 1 tablet (0.5 mg total) by mouth at bedtime. 90 tablet 3  . risperiDONE (RISPERDAL) 3 MG tablet Take 3  mg by mouth at bedtime.    . sertraline (ZOLOFT) 100 MG tablet Take 100 mg by mouth daily.    . traMADol (ULTRAM) 50 MG tablet Take 1 tablet (50 mg total) by mouth every 8 (eight) hours as needed. 40 tablet 1  . URELLE (URELLE/URISED) 81 MG TABS tablet Take 1 tablet (81 mg total) by mouth 4 (four) times daily. (Patient taking differently: Take 1 tablet by mouth every 6 (six) hours as needed. ) 120 each 0  . Vitamin D, Ergocalciferol, (DRISDOL) 50000 units CAPS capsule TAKE 1 CAPSULE BY MOUTH  EVERY 7 DAYS 12 capsule 3  . zaleplon (SONATA) 10 MG capsule Take 10 mg by mouth daily as needed.    . furosemide (LASIX) 20 MG tablet Take 1 tablet (20 mg total) by mouth daily. 30 tablet 1   Facility-Administered Medications Prior to Visit  Medication Dose Route Frequency Provider Last Rate Last Dose  . cyanocobalamin  ((VITAMIN B-12)) injection 1,000 mcg  1,000 mcg Intramuscular Q30 days Kathrine Haddock, NP   1,000 mcg at 07/17/16 1448     Allergies: Avelox [moxifloxacin hcl in nacl] and Stadol [butorphanol]    PHYSICAL EXAM: VS:  BP 118/74 (BP Location: Left Arm, Patient Position: Sitting, Cuff Size: Normal)   Pulse 80   Ht 5' 2"  (1.575 m)   Wt 161 lb 4 oz (73.1 kg)   LMP  (LMP Unknown)   BMI 29.49 kg/m  , Body mass index is 29.49 kg/m. Wt Readings from Last 3 Encounters:  09/06/16 161 lb 4 oz (73.1 kg)  08/02/16 159 lb 8 oz (72.3 kg)  07/17/16 161 lb 6.4 oz (73.2 kg)    GENERAL:  well developed, well nourished,  not in acute distress HEENT: normocephalic, pink conjunctivae, anicteric sclerae, no xanthelasma, normal dentition, oropharynx clear NECK:  no neck vein engorgement, JVP normal, no hepatojugular reflux, carotid upstroke brisk and symmetric, no bruit, no thyromegaly, no lymphadenopathy LUNGS:  good respiratory effort, clear to auscultation bilaterally CV:  PMI not displaced, no thrills, no lifts, S1 and S2 within normal limits, no palpable S3 or S4, no murmurs, no rubs, no gallops ABD:  Soft, nontender, nondistended, normoactive bowel sounds, no abdominal aortic bruit, no hepatomegaly, no splenomegaly MS: nontender back, no kyphosis, no scoliosis, no joint deformities EXT:  2+ DP/PT pulses, + edema, no varicosities, no cyanosis, no clubbing SKIN: warm, nondiaphoretic, normal turgor, no ulcers NEUROPSYCH: alert, oriented to person, place, and time, sensory/motor grossly intact, normal mood, appropriate affect  Recent Labs: 07/17/2016: ALT 26 08/13/2016: BUN 11; Creatinine, Ser 0.77; Potassium 3.9; Sodium 136   Lipid Panel    Component Value Date/Time   CHOL 139 07/17/2016 1408   CHOL 148 01/10/2012 0717   TRIG 99 07/17/2016 1408   TRIG 110 01/10/2012 0717   HDL 47 01/10/2012 0717   VLDL 20 07/17/2016 1408   VLDL 22 01/10/2012 0717   LDLCALC 79 01/10/2012 0717     Other studies  Reviewed:  EKG:  The ekg from1/10/2016 was personally reviewed by me and it revealed sinus rhythm, 85 BPM.  Additional studies/ records that were reviewed personally reviewed by me today include: none available Echo 08/23/2016: Left ventricle: The cavity size was normal. Wall thickness was   normal. Systolic function was normal. The estimated ejection   fraction was in the range of 55% to 60%. Wall motion was normal;   there were no regional wall motion abnormalities. Doppler   parameters are consistent with abnormal left ventricular  relaxation (grade 1 diastolic dysfunction). - Mitral valve: There was mild regurgitation. - Left atrium: The atrium was mildly dilated.  Nuclear stress test 08/30/2016: Exercise  myocardial perfusion imaging study with no significant  ischemia Normal wall motion, EF estimated at 50% No EKG changes concerning for ischemia at peak stress or in recovery. Target heart rate achieved Adequate exercise tolerance, exercised for 5:50 min Low risk scan    ASSESSMENT AND PLAN:  leg swelling Shortness of breath Possible CHF, diastolic dysfunction, acute on chronic, improving Continue Lasix. Check BMP today. Sent in potassium supplementation of potassium is on the lower side.  Continue sodium restriction, dietary changes.  No evidence of ischemia on stress test.  Current medicines are reviewed at length with the patient today.  The patient does not have concerns regarding medicines.  Labs/ tests ordered today include:  Orders Placed This Encounter  Procedures  . Basic metabolic panel    I had a lengthy and detailed discussion with the patient regarding diagnoses, prognosis, diagnostic options, treatment options , and side effects of medications.   I counseled the patient on importance of lifestyle modification including heart healthy diet, regular physical activity .   Disposition:   FU with undersigned 6 months  Signed, Wende Bushy, MD    09/06/2016 4:48 PM    [place]  This note was generated in part with voice recognition software and I apologize for any typographical errors that were not detected and corrected.

## 2016-09-10 ENCOUNTER — Encounter: Payer: Self-pay | Admitting: Unknown Physician Specialty

## 2016-09-10 ENCOUNTER — Ambulatory Visit (INDEPENDENT_AMBULATORY_CARE_PROVIDER_SITE_OTHER): Payer: Managed Care, Other (non HMO) | Admitting: Unknown Physician Specialty

## 2016-09-10 VITALS — BP 137/81 | HR 92 | Temp 98.6°F | Wt 160.2 lb

## 2016-09-10 DIAGNOSIS — M62838 Other muscle spasm: Secondary | ICD-10-CM

## 2016-09-10 DIAGNOSIS — E538 Deficiency of other specified B group vitamins: Secondary | ICD-10-CM | POA: Diagnosis not present

## 2016-09-10 MED ORDER — TRAMADOL HCL 50 MG PO TABS: 50.0000 mg | ORAL_TABLET | Freq: Three times a day (TID) | ORAL | 1 refills | Status: DC | PRN

## 2016-09-10 MED ORDER — CYCLOBENZAPRINE HCL 10 MG PO TABS: 10.0000 mg | ORAL_TABLET | Freq: Three times a day (TID) | ORAL | 0 refills | Status: DC | PRN

## 2016-09-10 NOTE — Progress Notes (Signed)
   BP 137/81 (BP Location: Left Arm, Patient Position: Sitting, Cuff Size: Normal)   Pulse 92   Temp 98.6 F (37 C)   Wt 160 lb 3.2 oz (72.7 kg)   LMP  (LMP Unknown)   SpO2 98%   BMI 29.30 kg/m    Subjective:    Patient ID: Amy Hansen, female    DOB: March 10, 1956, 61 y.o.   MRN: [id number]  HPI: Amy Hansen is a 61 y.o. female  Chief Complaint  Patient presents with  . Pain    pt states she hurt her neck and right shoulder on Friday while in traffic. States she remembers turning her head to the right but nothing else.   . Letter for School/Work    pt states she is out of work today, but may need to be out tomorrow if provider thinks she does and pt states she will need a note for this   Neck pain States this happened after backing out of a parking space.  She is unable to move her neck at all.  No numbness and tingling in arm.  She is already taking Flexeril QHS and takes Tramadol one.    Relevant past medical, surgical, family and social history reviewed and updated as indicated. Interim medical history since our last visit reviewed. Allergies and medications reviewed and updated.  Review of Systems  Per HPI unless specifically indicated above     Objective:    BP 137/81 (BP Location: Left Arm, Patient Position: Sitting, Cuff Size: Normal)   Pulse 92   Temp 98.6 F (37 C)   Wt 160 lb 3.2 oz (72.7 kg)   LMP  (LMP Unknown)   SpO2 98%   BMI 29.30 kg/m   Wt Readings from Last 3 Encounters:  09/10/16 160 lb 3.2 oz (72.7 kg)  09/06/16 161 lb 4 oz (73.1 kg)  08/02/16 159 lb 8 oz (72.3 kg)    Physical Exam  Constitutional: She is oriented to person, place, and time. She appears well-developed and well-nourished. No distress.  HENT:  Head: Normocephalic and atraumatic.  Eyes: Conjunctivae and lids are normal. Right eye exhibits no discharge. Left eye exhibits no discharge. No scleral icterus.  Neck: Normal range of motion. Neck supple. No JVD present. Carotid bruit  is not present.  Cardiovascular: Normal rate, regular rhythm and normal heart sounds.   Pulmonary/Chest: Effort normal and breath sounds normal.  Abdominal: Normal appearance. There is no splenomegaly or hepatomegaly.  Musculoskeletal:       Cervical back: She exhibits decreased range of motion, tenderness and spasm. She exhibits no bony tenderness and no swelling.  Neurological: She is alert and oriented to person, place, and time.  Skin: Skin is warm, dry and intact. No rash noted. No pallor.  Psychiatric: She has a normal mood and affect. Her behavior is normal. Judgment and thought content normal.      Assessment & Plan:   Problem List Items Addressed This Visit    None    Visit Diagnoses    Cervical paraspinal muscle spasm    -  Primary   Rx for Flexeril up to 3 times a day.  Rx for Tramadol.  PT for improving mobility as unable to turn or bend at this time.  Unable to take NSAIDS due to history        Follow up plan: Return if symptoms worsen or fail to improve.

## 2016-09-11 ENCOUNTER — Other Ambulatory Visit
Admission: RE | Admit: 2016-09-11 | Discharge: 2016-09-11 | Disposition: A | Discharge status: Home / Self Care | Payer: Managed Care, Other (non HMO) | Source: Ambulatory Visit | Attending: Cardiology | Admitting: Cardiology

## 2016-09-11 ENCOUNTER — Other Ambulatory Visit: Payer: Self-pay | Admitting: *Deleted

## 2016-09-11 DIAGNOSIS — I10 Essential (primary) hypertension: Secondary | ICD-10-CM

## 2016-09-15 LAB — BASIC METABOLIC PANEL
BUN / CREAT RATIO: 8 — AB (ref 9–23)
BUN: 5 mg/dL — AB (ref 6–24)
CALCIUM: 8.5 mg/dL — AB (ref 8.7–10.2)
CO2: 26 mmol/L (ref 18–29)
CREATININE: 0.64 mg/dL (ref 0.57–1.00)
Chloride: 95 mmol/L — ABNORMAL LOW (ref 96–106)
GFR calc Af Amer: 113 mL/min/{1.73_m2} (ref >59)
GFR calc non Af Amer: 98 mL/min/{1.73_m2} (ref >59)
Glucose: 123 mg/dL — ABNORMAL HIGH (ref 65–99)
Potassium: 3.7 mmol/L (ref 3.5–5.2)
Sodium: 138 mmol/L (ref 134–144)

## 2016-09-18 ENCOUNTER — Telehealth: Payer: Self-pay | Admitting: *Deleted

## 2016-09-18 DIAGNOSIS — M7989 Other specified soft tissue disorders: Secondary | ICD-10-CM

## 2016-09-18 MED ORDER — POTASSIUM CHLORIDE ER 10 MEQ PO TBCR: 20.0000 meq | EXTENDED_RELEASE_TABLET | Freq: Every day | ORAL | 0 refills | Status: DC

## 2016-09-18 NOTE — Telephone Encounter (Signed)
Left voicemail message to call back regarding results and medication changes.

## 2016-09-18 NOTE — Telephone Encounter (Signed)
Spoke with patient and reviewed results and recommendations. Instructed her to start taking potassium once daily and to have repeat labs in one week. She verbalized understanding of our conversation, agreement with plan, and had no further questions at this time.

## 2016-09-18 NOTE — Telephone Encounter (Signed)
-----   Message from Wende Bushy, MD sent at 09/18/2016 10:20 AM EST ----- We can send in KCl 14mq qd and rpt in 1 week bmp

## 2016-10-17 ENCOUNTER — Ambulatory Visit (INDEPENDENT_AMBULATORY_CARE_PROVIDER_SITE_OTHER): Payer: Managed Care, Other (non HMO) | Admitting: Family Medicine

## 2016-10-17 ENCOUNTER — Encounter: Payer: Self-pay | Admitting: Family Medicine

## 2016-10-17 VITALS — BP 112/77 | HR 108 | Temp 98.6°F | Wt 157.0 lb

## 2016-10-17 DIAGNOSIS — E538 Deficiency of other specified B group vitamins: Secondary | ICD-10-CM | POA: Diagnosis not present

## 2016-10-17 DIAGNOSIS — D509 Iron deficiency anemia, unspecified: Secondary | ICD-10-CM | POA: Diagnosis not present

## 2016-10-17 LAB — CBC WITH DIFFERENTIAL/PLATELET
HEMATOCRIT: 34.1 % (ref 34.0–46.6)
HEMOGLOBIN: 10.7 g/dL — AB (ref 11.1–15.9)
Lymphocytes Absolute: 1.4 10*3/uL (ref 0.7–3.1)
Lymphs: 31 %
MCH: 23.4 pg — AB (ref 26.6–33.0)
MCHC: 31.4 g/dL — ABNORMAL LOW (ref 31.5–35.7)
MCV: 75 fL — AB (ref 79–97)
MID (ABSOLUTE): 0.3 10*3/uL (ref 0.1–1.6)
MID: 7 %
Neutrophils Absolute: 2.7 10*3/uL (ref 1.4–7.0)
Neutrophils: 62 %
Platelets: 368 10*3/uL (ref 150–379)
RBC: 4.58 x10E6/uL (ref 3.77–5.28)
RDW: 15.8 % — ABNORMAL HIGH (ref 12.3–15.4)
WBC: 4.4 10*3/uL (ref 3.4–10.8)

## 2016-10-17 NOTE — Progress Notes (Signed)
BP 112/77   Pulse (!) 108   Temp 98.6 F (37 C)   Wt 157 lb (71.2 kg)   LMP  (LMP Unknown)   SpO2 96%   BMI 28.72 kg/m    Subjective:    Patient ID: Amy Hansen, female    DOB: Nov 09, 1955, 61 y.o.   MRN: [id number]  HPI: Amy Hansen is a 61 y.o. female  Chief Complaint  Patient presents with  . Fatigue    x approx 6 weeks, getting worse. History of iron deficiency anemia and would like it checked.    Patient presents with progressively worsening fatigue for more than 6 weeks. Hx of MDS and iron deficiency anemia, was getting infusions with Hematology up until about 2 years ago but provider left and labs were stable at that time so she has not been back. Denies CP, SOB, HAs. Hx of gastric bypass so cannot take PO iron supplements. Denies recent travel, abnormal weight loss, fevers, chills, sweats, tick bites.   Also due for B12 injection today, gets them every 4 weeks. No issues or concerns, tolerates them well.   Relevant past medical, surgical, family and social history reviewed and updated as indicated. Interim medical history since our last visit reviewed. Allergies and medications reviewed and updated.  Review of Systems  Constitutional: Positive for fatigue.  HENT: Negative.   Eyes: Negative.   Respiratory: Negative.   Cardiovascular: Negative.   Gastrointestinal: Negative.  Negative for blood in stool.  Genitourinary: Negative.   Musculoskeletal: Negative.   Neurological: Negative.   Psychiatric/Behavioral: Negative.     Per HPI unless specifically indicated above     Objective:    BP 112/77   Pulse (!) 108   Temp 98.6 F (37 C)   Wt 157 lb (71.2 kg)   LMP  (LMP Unknown)   SpO2 96%   BMI 28.72 kg/m   Wt Readings from Last 3 Encounters:  10/17/16 157 lb (71.2 kg)  09/10/16 160 lb 3.2 oz (72.7 kg)  09/06/16 161 lb 4 oz (73.1 kg)    Physical Exam  Constitutional: She is oriented to person, place, and time. She appears well-developed and  well-nourished. No distress.  HENT:  Head: Atraumatic.  Nose: Nose normal.  Mouth/Throat: Oropharynx is clear and moist.  Pale conjunctiva   Eyes: Conjunctivae are normal. Pupils are equal, round, and reactive to light.  Neck: Normal range of motion. Neck supple.  Cardiovascular: Normal heart sounds and intact distal pulses.   Tachycardic rate  Pulmonary/Chest: Effort normal and breath sounds normal. No respiratory distress.  Abdominal: Soft. Bowel sounds are normal.  Musculoskeletal: Normal range of motion.  Lymphadenopathy:    She has no cervical adenopathy.  Neurological: She is alert and oriented to person, place, and time.  Skin: Skin is warm and dry.  Psychiatric: She has a normal mood and affect. Her behavior is normal.  Nursing note and vitals reviewed.     Assessment & Plan:   Problem List Items Addressed This Visit      Other   B12 deficiency    B12 injection given today. Stable      Iron deficiency anemia - Primary    Abnormal CBC indicating microcytic anemia, await anemia panel and refer back to hematology for management      Relevant Orders   CBC With Differential/Platelet (Completed)   Vitamin B12 (Completed)   Folate (Completed)   Iron and TIBC (Completed)   Ferritin (Completed)   Ambulatory  referral to Hematology       Follow up plan: Return if symptoms worsen or fail to improve.

## 2016-10-18 DIAGNOSIS — D509 Iron deficiency anemia, unspecified: Secondary | ICD-10-CM | POA: Insufficient documentation

## 2016-10-18 LAB — FOLATE: Folate: 17.4 ng/mL (ref >3.0)

## 2016-10-18 LAB — IRON AND TIBC
IRON: 27 ug/dL (ref 27–159)
Iron Saturation: 6 % — CL (ref 15–55)
TIBC: 473 ug/dL — AB (ref 250–450)
UIBC: 446 ug/dL — AB (ref 131–425)

## 2016-10-18 LAB — FERRITIN: FERRITIN: 9 ng/mL — AB (ref 15–150)

## 2016-10-18 LAB — VITAMIN B12: VITAMIN B 12: 375 pg/mL (ref 232–1245)

## 2016-10-18 NOTE — Assessment & Plan Note (Signed)
B12 injection given today. Stable

## 2016-10-18 NOTE — Assessment & Plan Note (Signed)
Abnormal CBC indicating microcytic anemia, await anemia panel and refer back to hematology for management

## 2016-10-18 NOTE — Patient Instructions (Signed)
Follow up with Hematology as scheduled

## 2016-10-19 ENCOUNTER — Telehealth: Payer: Self-pay

## 2016-10-19 NOTE — Telephone Encounter (Signed)
Received a fax from [facility] on tramadol presciption. They stated that they just want to make sure we are aware that the patient also takes Alprazolam 0.5 mg from Dr. Adelene Idler. They just wanted to let us know that she is on a benzo / opiod at the same time. Just FYI.

## 2016-10-30 ENCOUNTER — Encounter: Payer: Self-pay | Admitting: Unknown Physician Specialty

## 2016-10-30 ENCOUNTER — Other Ambulatory Visit: Payer: Self-pay | Admitting: Unknown Physician Specialty

## 2016-10-30 DIAGNOSIS — E119 Type 2 diabetes mellitus without complications: Secondary | ICD-10-CM

## 2016-11-02 ENCOUNTER — Encounter: Payer: Self-pay | Admitting: Oncology

## 2016-11-02 ENCOUNTER — Inpatient Hospital Stay: Payer: Managed Care, Other (non HMO) | Attending: Oncology | Admitting: Oncology

## 2016-11-02 VITALS — BP 152/81 | HR 88 | Temp 96.0°F | Resp 18 | Ht 62.99 in | Wt 159.5 lb

## 2016-11-02 DIAGNOSIS — R531 Weakness: Secondary | ICD-10-CM | POA: Diagnosis not present

## 2016-11-02 DIAGNOSIS — Z79899 Other long term (current) drug therapy: Secondary | ICD-10-CM | POA: Diagnosis not present

## 2016-11-02 DIAGNOSIS — R159 Full incontinence of feces: Secondary | ICD-10-CM | POA: Diagnosis not present

## 2016-11-02 DIAGNOSIS — G2581 Restless legs syndrome: Secondary | ICD-10-CM

## 2016-11-02 DIAGNOSIS — Z9884 Bariatric surgery status: Secondary | ICD-10-CM | POA: Diagnosis not present

## 2016-11-02 DIAGNOSIS — E538 Deficiency of other specified B group vitamins: Secondary | ICD-10-CM | POA: Insufficient documentation

## 2016-11-02 DIAGNOSIS — D509 Iron deficiency anemia, unspecified: Secondary | ICD-10-CM | POA: Diagnosis not present

## 2016-11-02 DIAGNOSIS — F419 Anxiety disorder, unspecified: Secondary | ICD-10-CM | POA: Diagnosis not present

## 2016-11-02 DIAGNOSIS — N952 Postmenopausal atrophic vaginitis: Secondary | ICD-10-CM | POA: Insufficient documentation

## 2016-11-02 DIAGNOSIS — Z7984 Long term (current) use of oral hypoglycemic drugs: Secondary | ICD-10-CM

## 2016-11-02 DIAGNOSIS — E1165 Type 2 diabetes mellitus with hyperglycemia: Secondary | ICD-10-CM | POA: Insufficient documentation

## 2016-11-02 DIAGNOSIS — Z87440 Personal history of urinary (tract) infections: Secondary | ICD-10-CM | POA: Diagnosis not present

## 2016-11-02 DIAGNOSIS — N189 Chronic kidney disease, unspecified: Secondary | ICD-10-CM | POA: Insufficient documentation

## 2016-11-02 DIAGNOSIS — Z8669 Personal history of other diseases of the nervous system and sense organs: Secondary | ICD-10-CM

## 2016-11-02 DIAGNOSIS — R300 Dysuria: Secondary | ICD-10-CM

## 2016-11-02 DIAGNOSIS — E559 Vitamin D deficiency, unspecified: Secondary | ICD-10-CM | POA: Diagnosis not present

## 2016-11-02 DIAGNOSIS — R5383 Other fatigue: Secondary | ICD-10-CM | POA: Diagnosis not present

## 2016-11-02 DIAGNOSIS — N941 Unspecified dyspareunia: Secondary | ICD-10-CM | POA: Diagnosis not present

## 2016-11-02 DIAGNOSIS — M7989 Other specified soft tissue disorders: Secondary | ICD-10-CM | POA: Diagnosis not present

## 2016-11-02 DIAGNOSIS — Z87891 Personal history of nicotine dependence: Secondary | ICD-10-CM | POA: Insufficient documentation

## 2016-11-02 DIAGNOSIS — F329 Major depressive disorder, single episode, unspecified: Secondary | ICD-10-CM | POA: Insufficient documentation

## 2016-11-02 DIAGNOSIS — I129 Hypertensive chronic kidney disease with stage 1 through stage 4 chronic kidney disease, or unspecified chronic kidney disease: Secondary | ICD-10-CM

## 2016-11-02 DIAGNOSIS — D508 Other iron deficiency anemias: Secondary | ICD-10-CM

## 2016-11-02 NOTE — Progress Notes (Signed)
Here  for new pt eval

## 2016-11-05 ENCOUNTER — Encounter: Payer: Self-pay | Admitting: Oncology

## 2016-11-05 NOTE — Progress Notes (Signed)
Hematology/Oncology Consult note [facility] [phone] [phone]  Patient Care Team: Kathrine Haddock, NP as PCP - General (Nurse Practitioner)   Name of the patient: Amy Hansen  [id number]  1956-04-13    Reason for referral- iron deficiency anemia   Referring physician- Phoebe Perch  Date of visit: 11/05/16   History of presenting illness- Patient is a 61 year old female with a history of gastric bypass in 2013. She has been referred to [name] for iron deficiency anemia. Patient also has a history of B12 deficiency and gets then monthly. 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 She has received IV iron in the past. Currently patient is not on any oral iron. She has GI in the past for fecal incontinence issues. Most recent CBC from 10/17/2016 showed white count of 4.4, H&H of 10.7/34.1 with an MCV of 75 and platelet count of 368. B12 and folate were within normal limits. Iron studies showed low iron saturation of 6 and elevated TIBC of 173. Serum iron was low normal at 27. Ferritin was low at 9. Patient denies any regular use of NSAIDs. Denies any bleeding in her urine and stool.  ECOG PS- 0  Pain scale- 0   Review of systems- Review of Systems  Constitutional: Positive for malaise/fatigue. Negative for chills, fever and weight loss.  HENT: Negative for congestion, ear discharge and nosebleeds.   Eyes: Negative for blurred vision.  Respiratory: Negative for cough, hemoptysis, sputum production, shortness of breath and wheezing.   Cardiovascular: Negative for chest pain, palpitations, orthopnea and claudication.  Gastrointestinal: Negative for abdominal pain, blood in stool, constipation, diarrhea, heartburn, melena, nausea and vomiting.  Genitourinary: Negative for dysuria, flank pain, frequency, hematuria and urgency.  Musculoskeletal: Negative for back pain, joint pain and myalgias.  Skin:  Negative for rash.  Neurological: Negative for dizziness, tingling, focal weakness, seizures, weakness and headaches.  Endo/Heme/Allergies: Does not bruise/bleed easily.  Psychiatric/Behavioral: Negative for depression and suicidal ideas. The patient does not have insomnia.     Allergies  Allergen Reactions  . Avelox [Moxifloxacin Hcl In Nacl] Anaphylaxis  . Stadol [Butorphanol] Rash    Patient Active Problem List   Diagnosis Date Noted  . Iron deficiency anemia 10/18/2016  . Foot swelling 07/17/2016  . Hypertension 03/21/2016  . Vitamin D deficiency 02/21/2016  . B12 deficiency 02/21/2016  . Incontinent of feces 02/21/2016  . Poorly controlled type 2 diabetes mellitus ([place]) 09/13/2015  . Dysuria 06/14/2015  . Migraines 05/23/2015  . Restless legs syndrome (RLS) 05/23/2015  . Recurrent UTI 04/26/2015  . Atrophic vaginitis 04/26/2015  . Dyspareunia, female 04/26/2015     Past Medical History:  Diagnosis Date  . Anemia   . Anxiety   . Chronic kidney disease    UTI, hematuria in urine  . Depression   . Diabetes ([place])   . Diverticulosis   . Frequent headaches   . Interstitial cystitis   . Recurrent UTI   . Restless leg syndrome   . Urinary frequency      Past Surgical History:  Procedure Laterality Date  . ABDOMINAL HYSTERECTOMY    . bariatric bypass  2012  . CARPAL TUNNEL RELEASE Right 2003  . CARPAL TUNNEL RELEASE Right    2008  . CHOLECYSTECTOMY  1975  . CYSTOSCOPY W/ RETROGRADES Bilateral 06/06/2015   Procedure: CYSTOSCOPY WITH RETROGRADE PYELOGRAM;  Surgeon: Festus Aloe, MD;  Location: [facility] ORS;  Service:  Urology;  Laterality: Bilateral;  . FL INJ LEFT KNEE CT ARTHROGRAM ([facility] HX) Left    1995  . GASTRIC BYPASS  2010  . [place]  2013  . KNEE ARTHROSCOPY Left 1996  . TONSILLECTOMY      Social History   Social History  . Marital status: Married    Spouse name: N/A  . Number of children: N/A  . Years of education: N/A    Occupational History  . Not on file.   Social History Main Topics  . Smoking status: Former Smoker    Packs/day: 0.50    Types: Cigarettes    Quit date: 04/25/1975  . Smokeless tobacco: Never Used     Comment: quit 40 years  . Alcohol use No  . Drug use: No  . Sexual activity: No   Other Topics Concern  . Not on file   Social History Narrative   Caffeine 5 servings per day.     Family History  Problem Relation Age of Onset  . Stroke Father   . Heart failure Sister   . Bladder Cancer Neg Hx   . Kidney disease Neg Hx   . Prostate cancer Neg Hx      Current Outpatient Prescriptions:  .  ALPRAZolam (XANAX) 0.5 MG tablet, Take 0.5 mg by mouth at bedtime as needed for anxiety., Disp: , Rfl:  .  buPROPion (WELLBUTRIN XL) 150 MG 24 hr tablet, Take 150 mg by mouth daily. , Disp: , Rfl:  .  BYDUREON 2 MG PEN, INJECT SUBCUTANEOUSLY 2MG  ONCE A WEEK, Disp: 12 each, Rfl: 3 .  cyclobenzaprine (FLEXERIL) 10 MG tablet, Take 1 tablet (10 mg total) by mouth 3 (three) times daily as needed for muscle spasms., Disp: 90 tablet, Rfl: 0 .  eszopiclone (LUNESTA) 1 MG TABS tablet, Take 1 mg by mouth at bedtime as needed for sleep. Take immediately before bedtime, Disp: , Rfl:  .  furosemide (LASIX) 20 MG tablet, Take 1 tablet (20 mg total) by mouth daily., Disp: 90 tablet, Rfl: 3 .  lamoTRIgine (LAMICTAL) 200 MG tablet, Take 150 mg by mouth. Take 2 tablets daily at bedtime, Disp: , Rfl:  .  metFORMIN (GLUCOPHAGE) 1000 MG tablet, TAKE 1 TABLET BY MOUTH TWO  TIMES DAILY WITH MEALS, Disp: 180 tablet, Rfl: 1 .  ONE TOUCH ULTRA TEST test strip, daily., Disp: , Rfl:  .  pramipexole (MIRAPEX) 0.5 MG tablet, Take 1 tablet (0.5 mg total) by mouth at bedtime., Disp: 90 tablet, Rfl: 3 .  risperiDONE (RISPERDAL) 1 MG tablet, Take 1 mg by mouth at bedtime., Disp: , Rfl:  .  sertraline (ZOLOFT) 100 MG tablet, Take 100 mg by mouth daily., Disp: , Rfl:  .  traMADol (ULTRAM) 50 MG tablet, Take 1 tablet (50 mg  total) by mouth every 8 (eight) hours as needed., Disp: 40 tablet, Rfl: 1 .  Vitamin D, Ergocalciferol, (DRISDOL) 50000 units CAPS capsule, TAKE 1 CAPSULE BY MOUTH  EVERY 7 DAYS, Disp: 12 capsule, Rfl: 3 .  fluticasone (FLONASE) 50 MCG/ACT nasal spray, Place 2 sprays into the nose as needed. , Disp: , Rfl:  .  URELLE (URELLE/URISED) 81 MG TABS tablet, Take 1 tablet (81 mg total) by mouth 4 (four) times daily. (Patient not taking: Reported on 11/02/2016), Disp: 120 each, Rfl: 0  Current Facility-Administered Medications:  .  cyanocobalamin ((VITAMIN B-12)) injection 1,000 mcg, 1,000 mcg, Intramuscular, Q30 days, Kathrine Haddock, NP, 1,000 mcg at 10/17/16 1000   Physical  exam:  Vitals:   11/02/16 1515  BP: (!) 152/81  Pulse: 88  Resp: 18  Temp: (!) 96 F (35.6 C)  TempSrc: Tympanic  Weight: 159 lb 8 oz (72.3 kg)  Height: 5' 2.99" (1.6 m)   Physical Exam  Constitutional: She is oriented to person, place, and time and well-developed, well-nourished, and in no distress.  HENT:  Head: Normocephalic and atraumatic.  Eyes: EOM are normal. Pupils are equal, round, and reactive to light.  Neck: Normal range of motion.  Cardiovascular: Normal rate, regular rhythm and normal heart sounds.   Pulmonary/Chest: Effort normal and breath sounds normal.  Abdominal: Soft. Bowel sounds are normal.  Neurological: She is alert and oriented to person, place, and time.  Skin: Skin is warm and dry.       CMP Latest Ref Rng & Units 09/06/2016  Glucose 65 - 99 mg/dL 123(H)  BUN 6 - 24 mg/dL 5(L)  Creatinine 0.57 - 1.00 mg/dL 0.64  Sodium 134 - 144 mmol/L 138  Potassium 3.5 - 5.2 mmol/L 3.7  Chloride 96 - 106 mmol/L 95(L)  CO2 18 - 29 mmol/L 26  Calcium 8.7 - 10.2 mg/dL 8.5(L)  Total Protein 6.0 - 8.5 g/dL -  Total Bilirubin 0.0 - 1.2 mg/dL -  Alkaline Phos 39 - 117 IU/L -  AST 0 - 40 IU/L -  ALT 0 - 32 IU/L -   CBC Latest Ref Rng & Units 10/17/2016  WBC 3.4 - 10.8 x10E3/uL 4.4  Hemoglobin 12.0 -  16.0 g/dL -  Hematocrit 34.0 - 46.6 % 34.1  Platelets 150 - 379 x10E3/uL 368    No images are attached to the encounter.  No results found.  Assessment and plan- Patient is a 61 y.o. female with a history of iron deficiency anemia likely secondary to gastric bypass  Patient has mild normocytic anemia and iron studies are indicated above severe iron deficiency. Given her history of gastric bypass I will proceed with 2 doses of ferriheme 510 mg. I will see her back in 4-6 weeks time with repeat CBC and iron studies.   Thank you for this kind referral and the opportunity to participate in the care of this patient   Visit Diagnosis 1. Other iron deficiency anemia     Dr. Randa Evens, MD, MPH [place] [facility] Pager- [phone] 11/05/2016

## 2016-11-06 ENCOUNTER — Inpatient Hospital Stay: Payer: Managed Care, Other (non HMO)

## 2016-11-06 VITALS — BP 118/76 | HR 74 | Temp 97.0°F | Resp 18

## 2016-11-06 DIAGNOSIS — D509 Iron deficiency anemia, unspecified: Secondary | ICD-10-CM

## 2016-11-06 MED ORDER — SODIUM CHLORIDE 0.9 % IV SOLN
510.0000 mg | Freq: Once | INTRAVENOUS | Status: AC
  Administered 2016-11-06: 510 mg via INTRAVENOUS
  Filled 2016-11-06: qty 17

## 2016-11-06 MED ORDER — SODIUM CHLORIDE 0.9 % IV SOLN
Freq: Once | INTRAVENOUS | Status: AC
  Administered 2016-11-06: 13:00:00 via INTRAVENOUS
  Filled 2016-11-06: qty 1000

## 2016-11-14 ENCOUNTER — Inpatient Hospital Stay: Payer: Managed Care, Other (non HMO)

## 2016-11-14 VITALS — BP 106/72 | HR 87 | Temp 96.5°F | Resp 18

## 2016-11-14 DIAGNOSIS — D509 Iron deficiency anemia, unspecified: Secondary | ICD-10-CM

## 2016-11-14 MED ORDER — SODIUM CHLORIDE 0.9 % IV SOLN
510.0000 mg | Freq: Once | INTRAVENOUS | Status: AC
  Administered 2016-11-14: 510 mg via INTRAVENOUS
  Filled 2016-11-14: qty 17

## 2016-11-14 MED ORDER — SODIUM CHLORIDE 0.9 % IV SOLN
Freq: Once | INTRAVENOUS | Status: AC
  Administered 2016-11-14: 13:00:00 via INTRAVENOUS
  Filled 2016-11-14: qty 1000

## 2016-11-27 ENCOUNTER — Ambulatory Visit (INDEPENDENT_AMBULATORY_CARE_PROVIDER_SITE_OTHER): Payer: Managed Care, Other (non HMO)

## 2016-11-27 DIAGNOSIS — E538 Deficiency of other specified B group vitamins: Secondary | ICD-10-CM

## 2016-11-28 LAB — HM DIABETES EYE EXAM

## 2016-12-01 ENCOUNTER — Other Ambulatory Visit: Payer: Self-pay | Admitting: Unknown Physician Specialty

## 2016-12-03 ENCOUNTER — Telehealth: Payer: Self-pay

## 2016-12-03 NOTE — Telephone Encounter (Signed)
Tramadol RX faxed to [facility].

## 2016-12-27 ENCOUNTER — Inpatient Hospital Stay: Payer: Managed Care, Other (non HMO) | Admitting: Oncology

## 2016-12-28 ENCOUNTER — Inpatient Hospital Stay: Payer: Managed Care, Other (non HMO) | Admitting: Oncology

## 2017-01-10 ENCOUNTER — Inpatient Hospital Stay: Payer: Managed Care, Other (non HMO) | Admitting: Oncology

## 2017-01-24 ENCOUNTER — Other Ambulatory Visit: Payer: Self-pay

## 2017-01-24 NOTE — Telephone Encounter (Signed)
Pharmacy is requesting a refill on Ondansetron. The request did not have a strength on it.

## 2017-01-25 ENCOUNTER — Inpatient Hospital Stay: Payer: Managed Care, Other (non HMO) | Attending: Oncology | Admitting: Oncology

## 2017-01-25 VITALS — BP 116/76 | HR 85 | Temp 96.2°F | Resp 18 | Wt 156.4 lb

## 2017-01-25 DIAGNOSIS — Z87440 Personal history of urinary (tract) infections: Secondary | ICD-10-CM | POA: Diagnosis not present

## 2017-01-25 DIAGNOSIS — G2581 Restless legs syndrome: Secondary | ICD-10-CM | POA: Insufficient documentation

## 2017-01-25 DIAGNOSIS — R350 Frequency of micturition: Secondary | ICD-10-CM | POA: Insufficient documentation

## 2017-01-25 DIAGNOSIS — D469 Myelodysplastic syndrome, unspecified: Secondary | ICD-10-CM | POA: Diagnosis not present

## 2017-01-25 DIAGNOSIS — E119 Type 2 diabetes mellitus without complications: Secondary | ICD-10-CM | POA: Diagnosis not present

## 2017-01-25 DIAGNOSIS — F419 Anxiety disorder, unspecified: Secondary | ICD-10-CM | POA: Diagnosis not present

## 2017-01-25 DIAGNOSIS — D509 Iron deficiency anemia, unspecified: Secondary | ICD-10-CM | POA: Diagnosis not present

## 2017-01-25 DIAGNOSIS — Z87891 Personal history of nicotine dependence: Secondary | ICD-10-CM

## 2017-01-25 DIAGNOSIS — F329 Major depressive disorder, single episode, unspecified: Secondary | ICD-10-CM | POA: Diagnosis not present

## 2017-01-25 DIAGNOSIS — E538 Deficiency of other specified B group vitamins: Secondary | ICD-10-CM | POA: Insufficient documentation

## 2017-01-25 DIAGNOSIS — Z79899 Other long term (current) drug therapy: Secondary | ICD-10-CM | POA: Insufficient documentation

## 2017-01-25 DIAGNOSIS — Z9884 Bariatric surgery status: Secondary | ICD-10-CM | POA: Diagnosis not present

## 2017-01-25 DIAGNOSIS — Z7984 Long term (current) use of oral hypoglycemic drugs: Secondary | ICD-10-CM

## 2017-01-25 MED ORDER — ONDANSETRON 4 MG PO TBDP: 4.0000 mg | ORAL_TABLET | Freq: Three times a day (TID) | ORAL | 0 refills | Status: DC | PRN

## 2017-01-25 NOTE — Telephone Encounter (Signed)
Rx sent in

## 2017-01-25 NOTE — Progress Notes (Signed)
Patient here today regarding iron deficiency anemia.

## 2017-01-26 NOTE — Progress Notes (Signed)
Hematology/Oncology Consult note [facility]  [phone] [phone]  Patient Care Team: Kathrine Haddock, NP as PCP - General (Nurse Practitioner)   Name of the patient: Amy Hansen  [id number]  June 20, 1956   Date of visit: 01/26/17  Diagnosis- iron deficiency anemia likely due to gastric bypass  Chief complaint/ Reason for visit- routine f/u  Heme/Onc history: Patient is a 61 year old female with a history of gastric bypass in 2013. She has been referred to [name] for iron deficiency anemia. Patient also has a history of B12 deficiency and gets then monthly. 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 She has received IV iron in the past. Currently patient is not on any oral iron. She has GI in the past for fecal incontinence issues. Most recent CBC from 10/17/2016 showed white count of 4.4, H&H of 10.7/34.1 with an MCV of 75 and platelet count of 368. B12 and folate were within normal limits. Iron studies showed low iron saturation of 6 and elevated TIBC of 173. Serum iron was low normal at 27. Ferritin was low at 9. Patient denies any regular use of NSAIDs. Denies any bleeding in her urine and stool.  Interval history- doing well. Denies any complaints. Energy levels improved  After IV iron  ECOG PS- 0   Review of systems- Review of Systems  Constitutional: Negative for chills, fever, malaise/fatigue and weight loss.  HENT: Negative for congestion, ear discharge and nosebleeds.   Eyes: Negative for blurred vision.  Respiratory: Negative for cough, hemoptysis, sputum production, shortness of breath and wheezing.   Cardiovascular: Negative for chest pain, palpitations, orthopnea and claudication.  Gastrointestinal: Negative for abdominal pain, blood in stool, constipation, diarrhea, heartburn, melena, nausea and vomiting.  Genitourinary: Negative for dysuria, flank pain, frequency, hematuria and  urgency.  Musculoskeletal: Negative for back pain, joint pain and myalgias.  Skin: Negative for rash.  Neurological: Negative for dizziness, tingling, focal weakness, seizures, weakness and headaches.  Endo/Heme/Allergies: Does not bruise/bleed easily.  Psychiatric/Behavioral: Negative for depression and suicidal ideas. The patient does not have insomnia.       Allergies  Allergen Reactions  . Avelox [Moxifloxacin Hcl In Nacl] Anaphylaxis  . Stadol [Butorphanol] Rash     Past Medical History:  Diagnosis Date  . Anemia   . Anxiety   . Chronic kidney disease    UTI, hematuria in urine  . Depression   . Diabetes ([place])   . Diverticulosis   . Frequent headaches   . Interstitial cystitis   . Recurrent UTI   . Restless leg syndrome   . Urinary frequency      Past Surgical History:  Procedure Laterality Date  . ABDOMINAL HYSTERECTOMY    . bariatric bypass  2012  . CARPAL TUNNEL RELEASE Right 2003  . CARPAL TUNNEL RELEASE Right    2008  . CHOLECYSTECTOMY  1975  . CYSTOSCOPY W/ RETROGRADES Bilateral 06/06/2015   Procedure: CYSTOSCOPY WITH RETROGRADE PYELOGRAM;  Surgeon: Festus Aloe, MD;  Location: [facility] ORS;  Service: Urology;  Laterality: Bilateral;  . FL INJ LEFT KNEE CT ARTHROGRAM ([facility] HX) Left    1995  . GASTRIC BYPASS  2010  . [place]  2013  . KNEE ARTHROSCOPY Left 1996  . TONSILLECTOMY      Social History   Social History  . Marital status: Married    Spouse name: N/A  . Number of children: N/A  . Years  of education: N/A   Occupational History  . Not on file.   Social History Main Topics  . Smoking status: Former Smoker    Packs/day: 0.50    Types: Cigarettes    Quit date: 04/25/1975  . Smokeless tobacco: Never Used     Comment: quit 40 years  . Alcohol use No  . Drug use: No  . Sexual activity: No   Other Topics Concern  . Not on file   Social History Narrative   Caffeine 5 servings per day.    Family History  Problem  Relation Age of Onset  . Stroke Father   . Heart failure Sister   . Bladder Cancer Neg Hx   . Kidney disease Neg Hx   . Prostate cancer Neg Hx      Current Outpatient Prescriptions:  .  ALPRAZolam (XANAX) 0.5 MG tablet, Take 0.5 mg by mouth at bedtime as needed for anxiety., Disp: , Rfl:  .  buPROPion (WELLBUTRIN XL) 150 MG 24 hr tablet, Take 150 mg by mouth daily. , Disp: , Rfl:  .  BYDUREON 2 MG PEN, INJECT SUBCUTANEOUSLY 2MG  ONCE A WEEK, Disp: 12 each, Rfl: 3 .  cyclobenzaprine (FLEXERIL) 10 MG tablet, Take 1 tablet (10 mg total) by mouth 3 (three) times daily as needed for muscle spasms., Disp: 90 tablet, Rfl: 0 .  eszopiclone (LUNESTA) 1 MG TABS tablet, Take 1 mg by mouth at bedtime as needed for sleep. Take immediately before bedtime, Disp: , Rfl:  .  fluticasone (FLONASE) 50 MCG/ACT nasal spray, Place 2 sprays into the nose as needed. , Disp: , Rfl:  .  furosemide (LASIX) 20 MG tablet, Take 1 tablet (20 mg total) by mouth daily., Disp: 90 tablet, Rfl: 3 .  lamoTRIgine (LAMICTAL) 200 MG tablet, Take 150 mg by mouth. Take 2 tablets daily at bedtime, Disp: , Rfl:  .  metFORMIN (GLUCOPHAGE) 1000 MG tablet, TAKE 1 TABLET BY MOUTH TWO  TIMES DAILY WITH MEALS, Disp: 180 tablet, Rfl: 1 .  ondansetron (ZOFRAN ODT) 4 MG disintegrating tablet, Take 1 tablet (4 mg total) by mouth every 8 (eight) hours as needed., Disp: 40 tablet, Rfl: 0 .  ONE TOUCH ULTRA TEST test strip, daily., Disp: , Rfl:  .  pramipexole (MIRAPEX) 0.5 MG tablet, Take 1 tablet (0.5 mg total) by mouth at bedtime., Disp: 90 tablet, Rfl: 3 .  risperiDONE (RISPERDAL) 1 MG tablet, Take 1 mg by mouth at bedtime., Disp: , Rfl:  .  sertraline (ZOLOFT) 100 MG tablet, Take 100 mg by mouth daily., Disp: , Rfl:  .  Vitamin D, Ergocalciferol, (DRISDOL) 50000 units CAPS capsule, TAKE 1 CAPSULE BY MOUTH  EVERY 7 DAYS, Disp: 12 capsule, Rfl: 3 .  traMADol (ULTRAM) 50 MG tablet, TAKE 1 TABLET BY MOUTH EVERY 8 HOURS AS NEEDED (Patient not  taking: Reported on 01/25/2017), Disp: 40 tablet, Rfl: 1 .  URELLE (URELLE/URISED) 81 MG TABS tablet, Take 1 tablet (81 mg total) by mouth 4 (four) times daily. (Patient not taking: Reported on 11/02/2016), Disp: 120 each, Rfl: 0  Current Facility-Administered Medications:  .  cyanocobalamin ((VITAMIN B-12)) injection 1,000 mcg, 1,000 mcg, Intramuscular, Q30 days, Kathrine Haddock, NP, 1,000 mcg at 11/27/16 1137  Physical exam:  Vitals:   01/25/17 1432  BP: 116/76  Pulse: 85  Resp: 18  Temp: (!) 96.2 F (35.7 C)  TempSrc: Tympanic  Weight: 156 lb 6 oz (70.9 kg)   Physical Exam  Constitutional: She is oriented  to person, place, and time and well-developed, well-nourished, and in no distress.  HENT:  Head: Normocephalic and atraumatic.  Eyes: EOM are normal. Pupils are equal, round, and reactive to light.  Neck: Normal range of motion.  Cardiovascular: Normal rate, regular rhythm and normal heart sounds.   Pulmonary/Chest: Effort normal and breath sounds normal.  Abdominal: Soft. Bowel sounds are normal.  Neurological: She is alert and oriented to person, place, and time.  Skin: Skin is warm and dry.     CMP Latest Ref Rng & Units 09/06/2016  Glucose 65 - 99 mg/dL 123(H)  BUN 6 - 24 mg/dL 5(L)  Creatinine 0.57 - 1.00 mg/dL 0.64  Sodium 134 - 144 mmol/L 138  Potassium 3.5 - 5.2 mmol/L 3.7  Chloride 96 - 106 mmol/L 95(L)  CO2 18 - 29 mmol/L 26  Calcium 8.7 - 10.2 mg/dL 8.5(L)  Total Protein 6.0 - 8.5 g/dL -  Total Bilirubin 0.0 - 1.2 mg/dL -  Alkaline Phos 39 - 117 IU/L -  AST 0 - 40 IU/L -  ALT 0 - 32 IU/L -   CBC Latest Ref Rng & Units 10/17/2016  WBC 3.4 - 10.8 x10E3/uL 4.4  Hemoglobin 11.1 - 15.9 g/dL 10.7(L)  Hematocrit 34.0 - 46.6 % 34.1  Platelets 150 - 379 x10E3/uL 368      Assessment and plan- Patient is a 61 y.o. female with iron deficiency anemia likely due to gastric bypass  Last dose of feraheme was on 11/14/16. H/H signifucantly improved to 12.1/38.0 from  10.7/34.1. Iron studies, copper and zinc levels WNL. No need for IV iron at this time. Repeat cbc and iron studies in 3 months and she will see me in 6 months with same labs   Visit Diagnosis 1. Iron deficiency anemia, unspecified iron deficiency anemia type      Dr. Randa Evens, MD, MPH [place] [facility] Pager- [phone] 01/26/2017 3:08 PM

## 2017-01-29 ENCOUNTER — Encounter: Payer: Self-pay | Admitting: Oncology

## 2017-02-05 ENCOUNTER — Ambulatory Visit: Payer: Managed Care, Other (non HMO) | Admitting: Unknown Physician Specialty

## 2017-02-18 ENCOUNTER — Ambulatory Visit (INDEPENDENT_AMBULATORY_CARE_PROVIDER_SITE_OTHER): Payer: Managed Care, Other (non HMO) | Admitting: Unknown Physician Specialty

## 2017-02-18 ENCOUNTER — Encounter: Payer: Self-pay | Admitting: Unknown Physician Specialty

## 2017-02-18 ENCOUNTER — Encounter: Payer: Self-pay | Admitting: Family Medicine

## 2017-02-18 VITALS — BP 134/87 | HR 79 | Temp 98.2°F | Wt 153.8 lb

## 2017-02-18 DIAGNOSIS — E538 Deficiency of other specified B group vitamins: Secondary | ICD-10-CM

## 2017-02-18 DIAGNOSIS — D509 Iron deficiency anemia, unspecified: Secondary | ICD-10-CM

## 2017-02-18 DIAGNOSIS — G43009 Migraine without aura, not intractable, without status migrainosus: Secondary | ICD-10-CM | POA: Diagnosis not present

## 2017-02-18 DIAGNOSIS — K219 Gastro-esophageal reflux disease without esophagitis: Secondary | ICD-10-CM

## 2017-02-18 DIAGNOSIS — E1165 Type 2 diabetes mellitus with hyperglycemia: Secondary | ICD-10-CM | POA: Diagnosis not present

## 2017-02-18 DIAGNOSIS — I10 Essential (primary) hypertension: Secondary | ICD-10-CM | POA: Diagnosis not present

## 2017-02-18 MED ORDER — TRAMADOL HCL 50 MG PO TABS: 50.0000 mg | ORAL_TABLET | Freq: Three times a day (TID) | ORAL | 1 refills | Status: DC | PRN

## 2017-02-18 MED ORDER — ONDANSETRON 4 MG PO TBDP: 4.0000 mg | ORAL_TABLET | Freq: Three times a day (TID) | ORAL | 0 refills | Status: DC | PRN

## 2017-02-18 MED ORDER — PRAMIPEXOLE DIHYDROCHLORIDE 0.5 MG PO TABS: 0.5000 mg | ORAL_TABLET | Freq: Every day | ORAL | 3 refills | Status: DC

## 2017-02-18 MED ORDER — OMEPRAZOLE MAGNESIUM 20 MG PO TBEC: 20.0000 mg | DELAYED_RELEASE_TABLET | Freq: Every day | ORAL | 1 refills | Status: DC

## 2017-02-18 MED ORDER — CYANOCOBALAMIN 1000 MCG/ML IJ SOLN
1000.0000 ug | INTRAMUSCULAR | Status: AC
  Administered 2017-02-18 – 2017-05-21 (×2): 1000 ug via INTRAMUSCULAR

## 2017-02-18 NOTE — Assessment & Plan Note (Signed)
Stable, continue present medications.   

## 2017-02-18 NOTE — Assessment & Plan Note (Signed)
Hgb A1C is 7.2 and down from 7.6.  Continue lifestyle changes

## 2017-02-18 NOTE — Patient Instructions (Addendum)
Gastroesophageal Reflux Disease, Adult Normally, food travels down the esophagus and stays in the stomach to be digested. However, when a person has gastroesophageal reflux disease (GERD), food and stomach acid move back up into the esophagus. When this happens, the esophagus becomes sore and inflamed. Over time, GERD can create small holes (ulcers) in the lining of the esophagus. What are the causes? This condition is caused by a problem with the muscle between the esophagus and the stomach (lower esophageal sphincter, or LES). Normally, the LES muscle closes after food passes through the esophagus to the stomach. When the LES is weakened or abnormal, it does not close properly, and that allows food and stomach acid to go back up into the esophagus. The LES can be weakened by certain dietary substances, medicines, and medical conditions, including:  Tobacco use.  Pregnancy.  Having a hiatal hernia.  Heavy alcohol use.  Certain foods and beverages, such as coffee, chocolate, onions, and peppermint.  What increases the risk? This condition is more likely to develop in:  People who have an increased body weight.  People who have connective tissue disorders.  People who use NSAID medicines.  What are the signs or symptoms? Symptoms of this condition include:  Heartburn.  Difficult or painful swallowing.  The feeling of having a lump in the throat.  Abitter taste in the mouth.  Bad breath.  Having a large amount of saliva.  Having an upset or bloated stomach.  Belching.  Chest pain.  Shortness of breath or wheezing.  Ongoing (chronic) cough or a night-time cough.  Wearing away of tooth enamel.  Weight loss.  Different conditions can cause chest pain. Make sure to see your health care provider if you experience chest pain. How is this diagnosed? Your health care provider will take a medical history and perform a physical exam. To determine if you have mild or severe  GERD, your health care provider may also monitor how you respond to treatment. You may also have other tests, including:  An endoscopy toexamine your stomach and esophagus with a small camera.  A test thatmeasures the acidity level in your esophagus.  A test thatmeasures how much pressure is on your esophagus.  A barium swallow or modified barium swallow to show the shape, size, and functioning of your esophagus.  How is this treated? The goal of treatment is to help relieve your symptoms and to prevent complications. Treatment for this condition may vary depending on how severe your symptoms are. Your health care provider may recommend:  Changes to your diet.  Medicine.  Surgery.  Follow these instructions at home: Diet  Follow a diet as recommended by your health care provider. This may involve avoiding foods and drinks such as: ? Coffee and tea (with or without caffeine). ? Drinks that containalcohol. ? Energy drinks and sports drinks. ? Carbonated drinks or sodas. ? Chocolate and cocoa. ? Peppermint and mint flavorings. ? Garlic and onions. ? Horseradish. ? Spicy and acidic foods, including peppers, chili powder, curry powder, vinegar, hot sauces, and barbecue sauce. ? Citrus fruit juices and citrus fruits, such as oranges, lemons, and limes. ? Tomato-based foods, such as red sauce, chili, salsa, and pizza with red sauce. ? Fried and fatty foods, such as donuts, french fries, potato chips, and high-fat dressings. ? High-fat meats, such as hot dogs and fatty cuts of red and white meats, such as rib eye steak, sausage, ham, and bacon. ? High-fat dairy items, such as whole milk,  butter, and cream cheese.  Eat small, frequent meals instead of large meals.  Avoid drinking large amounts of liquid with your meals.  Avoid eating meals during the 2-3 hours before bedtime.  Avoid lying down right after you eat.  Do not exercise right after you eat. General  instructions  Pay attention to any changes in your symptoms.  Take over-the-counter and prescription medicines only as told by your health care provider. Do not take aspirin, ibuprofen, or other NSAIDs unless your health care provider told you to do so.  Do not use any tobacco products, including cigarettes, chewing tobacco, and e-cigarettes. If you need help quitting, ask your health care provider.  Wear loose-fitting clothing. Do not wear anything tight around your waist that causes pressure on your abdomen.  Raise (elevate) the head of your bed 6 inches (15cm).  Try to reduce your stress, such as with yoga or meditation. If you need help reducing stress, ask your health care provider.  If you are overweight, reduce your weight to an amount that is healthy for you. Ask your health care provider for guidance about a safe weight loss goal.  Keep all follow-up visits as told by your health care provider. This is important. Contact a health care provider if:  You have new symptoms.  You have unexplained weight loss.  You have difficulty swallowing, or it hurts to swallow.  You have wheezing or a persistent cough.  Your symptoms do not improve with treatment.  You have a hoarse voice. Get help right away if:  You have pain in your arms, neck, jaw, teeth, or back.  You feel sweaty, dizzy, or light-headed.  You have chest pain or shortness of breath.  You vomit and your vomit looks like blood or coffee grounds.  You faint.  Your stool is bloody or black.  You cannot swallow, drink, or eat. This information is not intended to replace advice given to you by your health care provider. Make sure you discuss any questions you have with your health care provider. Document Released: 04/25/2005 Document Revised: 12/14/2015 Document Reviewed: 11/10/2014 Elsevier Interactive Patient Education  2017 [employer].

## 2017-02-18 NOTE — Progress Notes (Signed)
++++++++++++  BP 134/87   Pulse 79   Temp 98.2 F (36.8 C)   Wt 153 lb 12.8 oz (69.8 kg)   LMP  (LMP Unknown)   SpO2 98%   BMI 27.25 kg/m    Subjective:    Patient ID: Amy Hansen, female    DOB: Dec 20, 1955, 61 y.o.   MRN: [id number]  HPI: Amy Hansen is a 61 y.o. female  Chief Complaint  Patient presents with  . Diabetes    pt states she had a recent eye exam, will fax form to [facility]   . Hernia    pt states she was told she has a hernia that was seen on kidney scans per patient    Iron Deficiency anemia Seeing Heme/Onc for iron infusions  B12 deficiency Monthly injections at this office    Migraine headaches Taking Tramadol for her migraine headaches.  Took more while in PT for her neck.  Had a severe migraine attack a few days ago which required 3Tramadol.  Typically takes 20 in a month.    Diabetes: Using medications without difficulties No hypoglycemic episodes No hyperglycemic episodes Feet problems:none Blood Sugars averaging:Not checking eye exam within last year Last Hgb A1C: 7.6  Hypertension  Using medications without difficulty Average home BPs Not checking   Using medication without problems or lightheadedness No chest pain with exertion or shortness of breath No Edema  Elevated Cholesterol Using medications without problems No Muscle aches  Diet: Exercise: Started drinking a lot of water.  Lost weight.  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   Reflux Getting worse. Small hiatal hernia noted on CT scan upon chart review    Relevant past medical, surgical, family and social history reviewed and updated as indicated. Interim medical history since our last visit reviewed. Allergies and medications reviewed and updated.  Review of Systems  Per HPI unless specifically indicated above     Objective:    BP 134/87   Pulse 79   Temp 98.2 F (36.8 C)   Wt 153 lb 12.8 oz (69.8 kg)   LMP  (LMP Unknown)   SpO2 98%   BMI 27.25 kg/m    Wt Readings from Last 3 Encounters:  02/18/17 153 lb 12.8 oz (69.8 kg)  01/25/17 156 lb 6 oz (70.9 kg)  11/02/16 159 lb 8 oz (72.3 kg)    Physical Exam  Results for orders placed or performed in visit on 10/17/16  CBC With Differential/Platelet  Result Value Ref Range   WBC 4.4 3.4 - 10.8 x10E3/uL   RBC 4.58 3.77 - 5.28 x10E6/uL   Hemoglobin 10.7 (L) 11.1 - 15.9 g/dL   Hematocrit 34.1 34.0 - 46.6 %   MCV 75 (L) 79 - 97 fL   MCH 23.4 (L) 26.6 - 33.0 pg   MCHC 31.4 (L) 31.5 - 35.7 g/dL   RDW 15.8 (H) 12.3 - 15.4 %   Platelets 368 150 - 379 x10E3/uL   Neutrophils 62 Not Estab. %   Lymphs 31 Not Estab. %   MID 7 Not Estab. %   Neutrophils Absolute 2.7 1.4 - 7.0 x10E3/uL   Lymphocytes Absolute 1.4 0.7 - 3.1 x10E3/uL   MID (Absolute) 0.3 0.1 - 1.6 X10E3/uL  Vitamin B12  Result Value Ref Range   Vitamin B-12 375 232 - 1,245 pg/mL  Folate  Result Value Ref Range   Folate 17.4 >3.0 ng/mL  Iron and TIBC  Result Value Ref Range   Total Iron Binding  Capacity 473 (H) 250 - 450 ug/dL   UIBC 446 (H) 131 - 425 ug/dL   Iron 27 27 - 159 ug/dL   Iron Saturation 6 (LL) 15 - 55 %  Ferritin  Result Value Ref Range   Ferritin 9 (L) 15 - 150 ng/mL      Assessment & Plan:   Problem List Items Addressed This Visit      Unprioritized   B12 deficiency - Primary   Relevant Medications   cyanocobalamin ((VITAMIN B-12)) injection 1,000 mcg   GERD (gastroesophageal reflux disease)    New diagnosis.  Will start Omeprazole 20 mg      Relevant Medications   ondansetron (ZOFRAN ODT) 4 MG disintegrating tablet   omeprazole (PRILOSEC OTC) 20 MG tablet   Hypertension    Stable, continue present medications.        Relevant Orders   Comprehensive metabolic panel   Iron deficiency anemia   Relevant Medications   cyanocobalamin ((VITAMIN B-12)) injection 1,000 mcg   Migraines    Stable, continue present medications.        Relevant Medications   traMADol (ULTRAM) 50 MG tablet    Poorly controlled type 2 diabetes mellitus (HCC)    Hgb A1C is 7.2 and down from 7.6.  Continue lifestyle changes      Relevant Orders   Comprehensive metabolic panel   Bayer DCA Hb A1c Waived       Follow up plan: Return in about 3 months (around 05/21/2017).

## 2017-02-18 NOTE — Assessment & Plan Note (Signed)
>>  ASSESSMENT AND PLAN FOR DIABETES MELLITUS TYPE 2 IN NONOBESE (HCC) WRITTEN ON 02/18/2017  1:52 PM BY WICKER, CHERYL, NP  Hgb A1C is 7.2 and down from 7.6.  Continue lifestyle changes

## 2017-02-18 NOTE — Assessment & Plan Note (Signed)
New diagnosis.  Will start Omeprazole 20 mg

## 2017-02-19 LAB — COMPREHENSIVE METABOLIC PANEL
ALBUMIN: 4.2 g/dL (ref 3.5–5.5)
ALT: 23 IU/L (ref 0–32)
AST: 23 IU/L (ref 0–40)
Albumin/Globulin Ratio: 1.8 (ref 1.2–2.2)
Alkaline Phosphatase: 126 IU/L — ABNORMAL HIGH (ref 39–117)
BILIRUBIN TOTAL: 0.2 mg/dL (ref 0.0–1.2)
BUN / CREAT RATIO: 10 (ref 9–23)
BUN: 6 mg/dL (ref 6–24)
CHLORIDE: 96 mmol/L (ref 96–106)
CO2: 27 mmol/L (ref 20–29)
Calcium: 9.3 mg/dL (ref 8.7–10.2)
Creatinine, Ser: 0.61 mg/dL (ref 0.57–1.00)
GFR calc non Af Amer: 100 mL/min/{1.73_m2} (ref >59)
GFR, EST AFRICAN AMERICAN: 115 mL/min/{1.73_m2} (ref >59)
GLOBULIN, TOTAL: 2.4 g/dL (ref 1.5–4.5)
GLUCOSE: 157 mg/dL — AB (ref 65–99)
Potassium: 4 mmol/L (ref 3.5–5.2)
SODIUM: 140 mmol/L (ref 134–144)
TOTAL PROTEIN: 6.6 g/dL (ref 6.0–8.5)

## 2017-02-19 NOTE — Progress Notes (Signed)
Normal labs.  Pt notified through mychart

## 2017-02-21 LAB — BAYER DCA HB A1C WAIVED: HB A1C (BAYER DCA - WAIVED): 7.2 % — ABNORMAL HIGH (ref <7.0)

## 2017-03-02 ENCOUNTER — Encounter: Payer: Self-pay | Admitting: Unknown Physician Specialty

## 2017-03-05 ENCOUNTER — Encounter: Payer: Self-pay | Admitting: Internal Medicine

## 2017-03-05 ENCOUNTER — Ambulatory Visit (INDEPENDENT_AMBULATORY_CARE_PROVIDER_SITE_OTHER): Payer: Managed Care, Other (non HMO) | Admitting: Internal Medicine

## 2017-03-05 VITALS — BP 120/60 | HR 94 | Ht 62.0 in | Wt 155.5 lb

## 2017-03-05 DIAGNOSIS — I5032 Chronic diastolic (congestive) heart failure: Secondary | ICD-10-CM

## 2017-03-05 DIAGNOSIS — R0789 Other chest pain: Secondary | ICD-10-CM | POA: Diagnosis not present

## 2017-03-05 MED ORDER — FUROSEMIDE 20 MG PO TABS
20.0000 mg | ORAL_TABLET | ORAL | 3 refills | Status: DC
Start: 2017-03-05 — End: 2017-08-16

## 2017-03-05 NOTE — Patient Instructions (Signed)
Medication Instructions:  Your physician has recommended you make the following change in your medication:  1- CHANGE Lasix to 20 mg (1 tablet) by mouth every other day.  - You may take an additional dose as needed for swelling or weight gain.   Labwork: NONE  Testing/Procedures: NONE  Follow-Up: Your physician wants you to follow-up in: 6 MONTHS WITH DR END. You will receive a reminder letter in the mail two months in advance. If you don't receive a letter, please call our office to schedule the follow-up appointment.  If you need a refill on your cardiac medications before your next appointment, please call your pharmacy.

## 2017-03-05 NOTE — Progress Notes (Signed)
Follow-up Outpatient Visit Date: 03/05/2017  Primary Care Provider: Kathrine Haddock, NP [address] [address]  Chief Complaint: Chest pain  HPI:  Amy Hansen is a 61 y.o. year-old female with history of diastolic heart failure, diabetes mellitus, anemia, anxiety, depression, and restless leg syndrome, who presents for follow-up of chronic diastolic heart failure and atypical chest pain. She was previously followed by Dr. Yvone Neu, having last been seen in February. Today, Amy Hansen reports that she continues to have intermittent chest pain. She describes a substernal stabbing discomfort with maximal intensity is 7/10 that can occur at any time. Happens about once a month. She was recently diagnosed with a hiatal hernia and started on omeprazole, which she has been using for about 2 weeks. His Amy Hansen is not sure if this has been helping. However, she notes that dietary changes have significantly improved her discomfort. She is avoiding spicy and fatty foods. She is also trying not to eat for at least 2-3 hours before going to bed.  Amy Hansen reports that her breathing has been good. She has occasional trace ankle edema, which is well-controlled with furosemide. She weighs herself about once a week and notes that her weight has been fairly stable. 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 This has been present for several years. She has never fallen or passed out.  --------------------------------------------------------------------------------------------------  Cardiovascular History & Procedures: Cardiovascular Problems:  Chronic diastolic heart failure  Atypical chest pain  Risk Factors:  Diabetes mellitus  Cath/PCI:  None  CV Surgery:  None  EP Procedures and Devices:  None  Non-Invasive Evaluation(s):  Exercise MPI (08/30/16): Low risk study without significant ischemia or scar. LVEF  50%.  TTE (08/23/16): Normal LV size and wall thickness. LVEF 55-60% with normal wall motion. Grade 1 diastolic dysfunction. Mild MR. Mild left atrial enlargement. Normal RV size and function.  Recent CV Pertinent Labs: Lab Results  Component Value Date   CHOL 139 07/17/2016   CHOL 148 01/10/2012   HDL 47 01/10/2012   LDLCALC 79 01/10/2012   TRIG 99 07/17/2016   TRIG 110 01/10/2012   K 4.0 02/18/2017   K 4.1 01/10/2012   BUN 6 02/18/2017   BUN 4 (L) 01/10/2012   CREATININE 0.61 02/18/2017   CREATININE 0.58 (L) 01/10/2012    Past medical and surgical history were reviewed and updated in EPIC.  No outpatient prescriptions have been marked as taking for the 03/05/17 encounter (Appointment) with Eithan Beagle, Harrell Gave, MD.   Current Facility-Administered Medications for the 03/05/17 encounter (Appointment) with Giovannina Mun, Harrell Gave, MD  Medication  . cyanocobalamin ((VITAMIN B-12)) injection 1,000 mcg    Allergies: Avelox [moxifloxacin hcl in nacl] and Stadol [butorphanol]  Social History   Social History  . Marital status: Married    Spouse name: N/A  . Number of children: N/A  . Years of education: N/A   Occupational History  . Not on file.   Social History Main Topics  . Smoking status: Former Smoker    Packs/day: 0.50    Types: Cigarettes    Quit date: 04/25/1975  . Smokeless tobacco: Never Used     Comment: quit 40 years  . Alcohol use No  . Drug use: No  . Sexual activity: No   Other Topics Concern  . Not on file   Social History Narrative   Caffeine 5 servings per day.    Family History  Problem Relation Age of Onset  .  Stroke Father   . Heart failure Sister   . Bladder Cancer Neg Hx   . Kidney disease Neg Hx   . Prostate cancer Neg Hx     Review of Systems: A 12-system review of systems was performed and was negative except as noted in the HPI.  --------------------------------------------------------------------------------------------------  Physical  Exam: BP 120/60 (BP Location: Left Arm, Patient Position: Sitting, Cuff Size: Normal)   Pulse 94   Ht 5' 2"  (1.575 m)   Wt 155 lb 8 oz (70.5 kg)   LMP  (LMP Unknown)   BMI 28.44 kg/m   General:  Overweight woman, seated comfortably in the exam room. HEENT: No conjunctival pallor or scleral icterus. Moist mucous membranes.  OP clear. Neck: Supple without lymphadenopathy, thyromegaly, JVD, or HJR. No carotid bruit. Lungs: Normal work of breathing. Clear to auscultation bilaterally without wheezes or crackles. Heart: Distant heart sounds. Regular rate and rhythm without murmurs, rubs, or gallops. Non-displaced PMI. Abd: Bowel sounds present. Soft, NT/ND without hepatosplenomegaly Ext: No lower extremity edema. Radial, PT, and DP pulses are 2+ bilaterally. Skin: Warm and dry without rash.  EKG:  Normal sinus rhythm without abnormalities.  Lab Results  Component Value Date   WBC 4.4 10/17/2016   HGB 10.7 (L) 10/17/2016   HCT 34.1 10/17/2016   MCV 75 (L) 10/17/2016   PLT 368 10/17/2016    Lab Results  Component Value Date   NA 140 02/18/2017   K 4.0 02/18/2017   CL 96 02/18/2017   CO2 27 02/18/2017   BUN 6 02/18/2017   CREATININE 0.61 02/18/2017   GLUCOSE 157 (H) 02/18/2017   ALT 23 02/18/2017    Lab Results  Component Value Date   CHOL 139 07/17/2016   HDL 47 01/10/2012   LDLCALC 79 01/10/2012   TRIG 99 07/17/2016    --------------------------------------------------------------------------------------------------  ASSESSMENT AND PLAN: Atypical chest pain Symptoms remain atypical and are most consistent with GI etiology. I encouraged Amy Hansen to continue with her PPI and dietary modifications, as these interventions seem to be working. Myocardial perfusion stress test in February was low risk. No further workup at this time.  Chronic diastolic heart failure Amy Hansen appears euvolemic with the exception of trace ankle edema. She does not have any symptoms to  suggest decompensated heart failure. Given her orthostatic lightheadedness and normal blood pressure today, we have agreed to decrease furosemide to 20 mg every other day. She may take an additional dose if she gains weight or notices worsening edema.  Follow-up: Return to clinic in 6 months.  Nelva Bush, MD 03/05/2017 10:57 AM

## 2017-04-18 ENCOUNTER — Encounter: Payer: Self-pay | Admitting: Urology

## 2017-04-18 ENCOUNTER — Other Ambulatory Visit: Payer: Self-pay | Admitting: Urology

## 2017-04-18 ENCOUNTER — Ambulatory Visit (INDEPENDENT_AMBULATORY_CARE_PROVIDER_SITE_OTHER): Payer: Managed Care, Other (non HMO) | Admitting: Urology

## 2017-04-18 VITALS — BP 122/82 | HR 88 | Ht 62.0 in | Wt 149.4 lb

## 2017-04-18 DIAGNOSIS — N390 Urinary tract infection, site not specified: Secondary | ICD-10-CM

## 2017-04-18 DIAGNOSIS — Z87448 Personal history of other diseases of urinary system: Secondary | ICD-10-CM | POA: Diagnosis not present

## 2017-04-18 LAB — URINALYSIS, COMPLETE
Bilirubin, UA: NEGATIVE
GLUCOSE, UA: NEGATIVE
KETONES UA: NEGATIVE
NITRITE UA: POSITIVE — AB
Protein, UA: NEGATIVE
RBC UA: NEGATIVE
SPEC GRAV UA: 1.01 (ref 1.005–1.030)
UUROB: 0.2 mg/dL (ref 0.2–1.0)
pH, UA: 6 (ref 5.0–7.5)

## 2017-04-18 LAB — MICROSCOPIC EXAMINATION: RBC MICROSCOPIC, UA: NONE SEEN /HPF (ref 0–?)

## 2017-04-18 MED ORDER — AMOXICILLIN-POT CLAVULANATE 875-125 MG PO TABS: 1.0000 | ORAL_TABLET | Freq: Two times a day (BID) | ORAL | 0 refills | Status: DC

## 2017-04-18 MED ORDER — URELLE 81 MG PO TABS: 1.0000 | ORAL_TABLET | Freq: Four times a day (QID) | ORAL | 0 refills | Status: DC | PRN

## 2017-04-18 MED ORDER — URETRON D/S PO TABS: 1.0000 | ORAL_TABLET | Freq: Four times a day (QID) | ORAL | 0 refills | Status: DC | PRN

## 2017-04-18 NOTE — Progress Notes (Signed)
2:36 PM   Amy Hansen 1956/02/11 [id number]  Referring provider: Kathrine Haddock, NP [address], [place] [address]  Chief Complaint  Patient presents with  . Recurrent UTI    Patient states having Bladder Spams last seen 07/2015    HPI: Patient is a 61 year old Caucasian female who presents today for a requesting an urgent appointment for frequency, intermittency and hesitancy.   Previous history She was seen in [facility]  ED on 05/22/2015 for gross hematuria and had a non-contrast CT that demonstrated no renal, ureteral or bladder calculi.  Her urine culture at that time was negative for infection.  She was still having significant bladder symptoms, so she underwent cystoscopy with bilateral retrogrades on  06/06/2015 and no GU pathology was identified.    Today, she is experiencing bladder spasm for the last 3 months.  She is also complaining of frequency, urgency and hesitancy.  She has not experienced any further gross hematuria.  Her UA was positive for nitrate, many bacteria and 11-30 WBC's.  She has not had recent fevers, chills, nausea or vomiting.  She is having nausea, but that has been occurring for the last three months.   She has been vomiting in the morning for the last three months.    PMH: Past Medical History:  Diagnosis Date  . Anemia   . Anxiety   . Chronic kidney disease    UTI, hematuria in urine  . Depression   . Diabetes ([place])   . Diverticulosis   . Frequent headaches   . Interstitial cystitis   . Recurrent UTI   . Restless leg syndrome   . Urinary frequency     Surgical History: Past Surgical History:  Procedure Laterality Date  . ABDOMINAL HYSTERECTOMY    . bariatric bypass  2012  . CARPAL TUNNEL RELEASE Right 2003  . CARPAL TUNNEL RELEASE Right    2008  . CHOLECYSTECTOMY  1975  . CYSTOSCOPY W/ RETROGRADES Bilateral 06/06/2015   Procedure: CYSTOSCOPY WITH RETROGRADE PYELOGRAM;  Surgeon: Festus Aloe, MD;  Location: [facility] ORS;  Service:  Urology;  Laterality: Bilateral;  . FL INJ LEFT KNEE CT ARTHROGRAM ([facility] HX) Left    1995  . GASTRIC BYPASS  2010  . [place]  2013  . KNEE ARTHROSCOPY Left 1996  . TONSILLECTOMY      Home Medications:  Allergies as of 04/18/2017      Reactions   Avelox [moxifloxacin Hcl In Nacl] Anaphylaxis   Stadol [butorphanol] Rash      Medication List       Accurate as of 04/18/17  2:36 PM. Always use your most recent med list.          ALPRAZolam 0.5 MG tablet Commonly known as:  XANAX Take 0.5 mg by mouth at bedtime as needed for anxiety.   amoxicillin-clavulanate 875-125 MG tablet Commonly known as:  AUGMENTIN Take 1 tablet by mouth every 12 (twelve) hours.   buPROPion 150 MG 24 hr tablet Commonly known as:  WELLBUTRIN XL Take 150 mg by mouth daily.   BYDUREON 2 MG Pen Generic drug:  Exenatide ER INJECT SUBCUTANEOUSLY 2MG  ONCE A WEEK   cyclobenzaprine 10 MG tablet Commonly known as:  FLEXERIL Take 1 tablet (10 mg total) by mouth 3 (three) times daily as needed for muscle spasms.   fluticasone 50 MCG/ACT nasal spray Commonly known as:  FLONASE Place 2 sprays into the nose as needed.   furosemide 20 MG tablet Commonly  known as:  LASIX Take 1 tablet (20 mg total) by mouth every other day.   lamoTRIgine 200 MG tablet Commonly known as:  LAMICTAL Take 150 mg by mouth. Take 2 tablets daily at bedtime   metFORMIN 1000 MG tablet Commonly known as:  GLUCOPHAGE TAKE 1 TABLET BY MOUTH TWO  TIMES DAILY WITH MEALS   omeprazole 20 MG tablet Commonly known as:  PRILOSEC OTC Take 1 tablet (20 mg total) by mouth daily.   ondansetron 4 MG disintegrating tablet Commonly known as:  ZOFRAN ODT Take 1 tablet (4 mg total) by mouth every 8 (eight) hours as needed.   ONE TOUCH ULTRA TEST test strip Generic drug:  glucose blood daily.   pramipexole 0.5 MG tablet Commonly known as:  MIRAPEX Take 1 tablet (0.5 mg total) by mouth at bedtime.   risperiDONE 1 MG  tablet Commonly known as:  RISPERDAL Take 1 mg by mouth at bedtime.   sertraline 100 MG tablet Commonly known as:  ZOLOFT Take 100 mg by mouth daily.   traMADol 50 MG tablet Commonly known as:  ULTRAM Take 1 tablet (50 mg total) by mouth every 8 (eight) hours as needed.   URELLE 81 MG Tabs tablet Take 1 tablet (81 mg total) by mouth every 6 (six) hours as needed for bladder spasms.   Vitamin D (Ergocalciferol) 50000 units Caps capsule Commonly known as:  DRISDOL TAKE 1 CAPSULE BY MOUTH  EVERY 7 DAYS   zolpidem 10 MG tablet Commonly known as:  AMBIEN Take 10 mg by mouth at bedtime.            Discharge Care Instructions        Start     Ordered   04/18/17 0000  Urinalysis, Complete     04/18/17 1354   04/18/17 0000  CULTURE, URINE COMPREHENSIVE     04/18/17 1421   04/18/17 0000  URELLE (URELLE/URISED) 81 MG TABS tablet  Every 6 hours PRN    Question:  Supervising Provider  Answer:  Hollice Espy   04/18/17 1433   04/18/17 0000  amoxicillin-clavulanate (AUGMENTIN) 875-125 MG tablet  Every 12 hours    Question:  Supervising Provider  Answer:  Hollice Espy   04/18/17 1434      Allergies:  Allergies  Allergen Reactions  . Avelox [Moxifloxacin Hcl In Nacl] Anaphylaxis  . Stadol [Butorphanol] Rash    Family History: Family History  Problem Relation Age of Onset  . Stroke Father   . Heart failure Sister   . Bladder Cancer Neg Hx   . Kidney disease Neg Hx   . Prostate cancer Neg Hx   . Kidney cancer Neg Hx     Social History:  reports that she quit smoking about 42 years ago. Her smoking use included Cigarettes. She smoked 0.50 packs per day. She has never used smokeless tobacco. She reports that she does not drink alcohol or use drugs.  ROS: UROLOGY Frequent Urination?: Yes Hard to postpone urination?: No Burning/pain with urination?: No Get up at night to urinate?: No Leakage of urine?: No Urine stream starts and stops?: Yes Trouble starting  stream?: Yes Do you have to strain to urinate?: No Blood in urine?: No Urinary tract infection?: No Sexually transmitted disease?: No Injury to kidneys or bladder?: No Painful intercourse?: No Weak stream?: No Currently pregnant?: No Vaginal bleeding?: No Last menstrual period?: n  Gastrointestinal Nausea?: No Vomiting?: Yes Indigestion/heartburn?: No Diarrhea?: Yes Constipation?: No  Constitutional Fever: No Night sweats?:  No Weight loss?: Yes Fatigue?: No  Skin Skin rash/lesions?: No Itching?: No  Eyes Blurred vision?: No Double vision?: No  Ears/Nose/Throat Sore throat?: No Sinus problems?: No  Hematologic/Lymphatic Swollen glands?: No Easy bruising?: Yes  Cardiovascular Leg swelling?: No Chest pain?: No  Respiratory Cough?: No Shortness of breath?: No  Endocrine Excessive thirst?: Yes  Musculoskeletal Back pain?: No Joint pain?: No  Neurological Headaches?: Yes Dizziness?: Yes  Psychologic Depression?: No Anxiety?: Yes  Physical Exam: BP 122/82   Pulse 88   Ht 5' 2"  (1.575 m)   Wt 149 lb 6.4 oz (67.8 kg)   LMP  (LMP Unknown)   BMI 27.33 kg/m   Constitutional: Well nourished. Alert and oriented, No acute distress. HEENT: [place] AT, moist mucus membranes. Trachea midline, no masses. Cardiovascular: No clubbing, cyanosis, or edema. Respiratory: Normal respiratory effort, no increased work of breathing. Skin: No rashes, bruises or suspicious lesions. Lymph: No cervical or inguinal adenopathy. Neurologic: Grossly intact, no focal deficits, moving all 4 extremities. Psychiatric: Normal mood and affect.  Laboratory Data: Lab Results  Component Value Date   WBC 4.4 10/17/2016   HGB 10.7 (L) 10/17/2016   HCT 34.1 10/17/2016   MCV 75 (L) 10/17/2016   PLT 368 10/17/2016    Lab Results  Component Value Date   CREATININE 0.61 02/18/2017    Lab Results  Component Value Date   HGBA1C 7.3% 03/21/2016    Urinalysis 11-30 WBC's.    Many bacteria.  Nitrite positive.  See EPIC.    I have reviewed the labs  Assessment & Plan:    1. UTI  - UA is suspicious for infection - will send for culture - Augmentin prescribed - will change if appropriate once culture is available  - Urinalysis, Complete   2. History of hematuria  - She was seen in [facility]  ED on 05/22/2015 for gross hematuria and had a non-contrast CT that demonstrated no renal, ureteral or bladder calculi.  Her urine culture at that time was negative for infection.  She was still having significant bladder symptoms, so she underwent cystoscopy with bilateral retrogrades on  06/06/2015 and no GU pathology was identified.     Return in about 1 year (around 04/18/2018) for UA, , PVR and OAB questionnaire.  Zara Council, [facility] [address], [place], [place] [address] [phone]

## 2017-04-22 ENCOUNTER — Telehealth: Payer: Self-pay

## 2017-04-22 LAB — CULTURE, URINE COMPREHENSIVE

## 2017-04-22 NOTE — Telephone Encounter (Signed)
Notified on vmail

## 2017-04-22 NOTE — Telephone Encounter (Signed)
-----   Message from Nori Riis, PA-C sent at 04/22/2017  7:53 AM EDT ----- Please let Mrs. Mckimmy know that her urine culture is positive.  The Augmentin 875/125 is the appropriate antibiotic.

## 2017-05-03 ENCOUNTER — Other Ambulatory Visit: Payer: Self-pay | Admitting: Unknown Physician Specialty

## 2017-05-16 ENCOUNTER — Telehealth: Payer: Self-pay | Admitting: Unknown Physician Specialty

## 2017-05-16 MED ORDER — SUMATRIPTAN SUCCINATE 50 MG PO TABS: ORAL_TABLET | ORAL | 0 refills | Status: DC

## 2017-05-16 NOTE — Telephone Encounter (Signed)
Left message to call.

## 2017-05-16 NOTE — Telephone Encounter (Signed)
Patient notified. She said that Imitrex gives her neck and shoulder pain so she can't take that.

## 2017-05-16 NOTE — Telephone Encounter (Signed)
Routing to provider  

## 2017-05-16 NOTE — Telephone Encounter (Signed)
Patient calling in regards to a prescription for a different migraine medication.. Currently taking tramadol but it is not working. Patient use to take Fiorinal for her migraines and would like to see if she can start taking that again instead.  Patient would like a call back in regards to the medication.   Please Advise.  Thank you

## 2017-05-16 NOTE — Telephone Encounter (Signed)
Looks like she's already got an appt scheduled for next week, needs to wait until then for a controlled substance. Please let her know Malachy Mood will be back in Monday and will give her a call. In meantime, sent over some imitrex to [facility] for her to try

## 2017-05-17 NOTE — Telephone Encounter (Signed)
Ok, I will leave this for Malachy Mood to review Monday morning and advise pt on next steps

## 2017-05-20 MED ORDER — BUTALBITAL-ASPIRIN-CAFFEINE 50-325-40 MG PO CAPS: 1.0000 | ORAL_CAPSULE | Freq: Two times a day (BID) | ORAL | 1 refills | Status: DC | PRN

## 2017-05-20 NOTE — Telephone Encounter (Signed)
We can prescribe Fiorinol, but it is controversial.  I am OK to prescribe if she also sees a neurologist.

## 2017-05-20 NOTE — Telephone Encounter (Signed)
Called and let patient know about medication. Patient is coming in for appointment tomorrow and will talk about neurology then.

## 2017-05-20 NOTE — Telephone Encounter (Signed)
RX signed and placed up front for pick up. Will call patient to let her know and ask about neurology.

## 2017-05-21 ENCOUNTER — Ambulatory Visit (INDEPENDENT_AMBULATORY_CARE_PROVIDER_SITE_OTHER): Payer: Managed Care, Other (non HMO) | Admitting: Unknown Physician Specialty

## 2017-05-21 ENCOUNTER — Encounter: Payer: Self-pay | Admitting: Unknown Physician Specialty

## 2017-05-21 VITALS — BP 121/79 | HR 82 | Temp 98.2°F | Wt 151.8 lb

## 2017-05-21 DIAGNOSIS — G43009 Migraine without aura, not intractable, without status migrainosus: Secondary | ICD-10-CM | POA: Diagnosis not present

## 2017-05-21 DIAGNOSIS — I10 Essential (primary) hypertension: Secondary | ICD-10-CM

## 2017-05-21 DIAGNOSIS — E119 Type 2 diabetes mellitus without complications: Secondary | ICD-10-CM

## 2017-05-21 DIAGNOSIS — Z23 Encounter for immunization: Secondary | ICD-10-CM

## 2017-05-21 DIAGNOSIS — E538 Deficiency of other specified B group vitamins: Secondary | ICD-10-CM

## 2017-05-21 DIAGNOSIS — K219 Gastro-esophageal reflux disease without esophagitis: Secondary | ICD-10-CM

## 2017-05-21 DIAGNOSIS — E1165 Type 2 diabetes mellitus with hyperglycemia: Secondary | ICD-10-CM | POA: Diagnosis not present

## 2017-05-21 LAB — BAYER DCA HB A1C WAIVED: HB A1C: 6.4 % (ref <7.0)

## 2017-05-21 NOTE — Assessment & Plan Note (Signed)
Stable, continue present medications.   

## 2017-05-21 NOTE — Progress Notes (Signed)
BP 121/79   Pulse 82   Temp 98.2 F (36.8 C)   Wt 151 lb 12.8 oz (68.9 kg)   LMP  (LMP Unknown)   SpO2 98%   BMI 27.76 kg/m    Subjective:    Patient ID: Amy Hansen, female    DOB: 06-Jun-1956, 61 y.o.   MRN: [id number]  HPI: Amy Hansen is a 61 y.o. female  Chief Complaint  Patient presents with  . Follow-up  . Immunizations    pt interested in shingles vaccine, Shingrix written on RX pad for provider to sign    Migraine headaches Getting worse.  Required restarting Fioricet and prescription given.  Will go back to see neurology  Diabetes: Using medications without difficulties.  Lost some weight and drinking a lot of water No hypoglycemic episodes No hyperglycemic episodes Feet problems:none Blood Sugars averaging: Not checking eye exam within last year Last Hgb A1C: 7.2  Hypertension  Using medications without difficulty Average home BPs Not checking                    Using medication without problems or lightheadedness No chest pain with exertion or shortness of breath No Edema  Elevated Cholesterol Not fasting today Using medications without problems No Muscle aches  Diet: Exercise: Started drinking a lot of water.  Lost weight.  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   Reflux Doing much better with Omeprazole  Relevant past medical, surgical, family and social history reviewed and updated as indicated. Interim medical history since our last visit reviewed. Allergies and medications reviewed and updated.  Review of Systems  Per HPI unless specifically indicated above     Objective:    BP 121/79   Pulse 82   Temp 98.2 F (36.8 C)   Wt 151 lb 12.8 oz (68.9 kg)   LMP  (LMP Unknown)   SpO2 98%   BMI 27.76 kg/m   Wt Readings from Last 3 Encounters:  05/21/17 151 lb 12.8 oz (68.9 kg)  04/18/17 149 lb 6.4 oz (67.8 kg)  03/05/17 155 lb 8 oz (70.5 kg)    Physical Exam  Constitutional: She is oriented to person, place, and time. She  appears well-developed and well-nourished. No distress.  HENT:  Head: Normocephalic and atraumatic.  Eyes: Conjunctivae and lids are normal. Right eye exhibits no discharge. Left eye exhibits no discharge. No scleral icterus.  Neck: Normal range of motion. Neck supple. No JVD present. Carotid bruit is not present.  Cardiovascular: Normal rate, regular rhythm and normal heart sounds.   Pulmonary/Chest: Effort normal and breath sounds normal.  Abdominal: Normal appearance. There is no splenomegaly or hepatomegaly.  Musculoskeletal: Normal range of motion.  Neurological: She is alert and oriented to person, place, and time.  Skin: Skin is warm, dry and intact. No rash noted. No pallor.  Psychiatric: She has a normal mood and affect. Her behavior is normal. Judgment and thought content normal.    Results for orders placed or performed in visit on 04/18/17  CULTURE, URINE COMPREHENSIVE  Result Value Ref Range   Urine Culture, Comprehensive Final report (A)    Organism ID, Bacteria Klebsiella oxytoca (A)    ANTIMICROBIAL SUSCEPTIBILITY Comment   Microscopic Examination  Result Value Ref Range   WBC, UA 11-30 (A) 0 - 5 /hpf   RBC, UA None seen 0 - 2 /hpf   Epithelial Cells (non renal) 0-10 0 - 10 /hpf   Bacteria, UA Many (A) None  seen/Few  Urinalysis, Complete  Result Value Ref Range   Specific Gravity, UA 1.010 1.005 - 1.030   pH, UA 6.0 5.0 - 7.5   Color, UA Yellow Yellow   Appearance Ur Cloudy (A) Clear   Leukocytes, UA 2+ (A) Negative   Protein, UA Negative Negative/Trace   Glucose, UA Negative Negative   Ketones, UA Negative Negative   RBC, UA Negative Negative   Bilirubin, UA Negative Negative   Urobilinogen, Ur 0.2 0.2 - 1.0 mg/dL   Nitrite, UA Positive (A) Negative   Microscopic Examination See below:       Assessment & Plan:   Problem List Items Addressed This Visit      Unprioritized   B12 deficiency   Diabetes type 2, controlled ([place])    Hgb A1C was 6.4.   Continue present medications      GERD (gastroesophageal reflux disease)    Stable, continue present medications.        Hypertension    Stable, continue present medications.        Migraines    Worsening.  Refer back to neurology.  Rx given to Fioricet       Other Visit Diagnoses    Need for influenza vaccination    -  Primary   Relevant Orders   Flu Vaccine QUAD 36+ mos IM (Completed)       Follow up plan: Return in about 6 months (around 11/19/2017).

## 2017-05-21 NOTE — Assessment & Plan Note (Signed)
Worsening.  Refer back to neurology.  Rx given to Fioricet

## 2017-05-21 NOTE — Patient Instructions (Signed)

## 2017-05-21 NOTE — Assessment & Plan Note (Signed)
>>  ASSESSMENT AND PLAN FOR DIABETES MELLITUS TYPE 2 IN NONOBESE (HCC) WRITTEN ON 05/21/2017  2:31 PM BY WICKER, CHERYL, NP  Hgb A1C was 6.4.  Continue present medications

## 2017-05-21 NOTE — Assessment & Plan Note (Signed)
Hgb A1C was 6.4.  Continue present medications

## 2017-05-31 ENCOUNTER — Telehealth: Payer: Self-pay

## 2017-05-31 ENCOUNTER — Encounter: Payer: Self-pay | Admitting: Oncology

## 2017-05-31 NOTE — Telephone Encounter (Signed)
Copied from [place] [phone]. Topic: Inquiry >> May 31, 2017 12:26 PM Amy Hansen, RMA wrote: Reason for CRM: patient needs documentation of medication prescribed in 2017, florinal, and is requesting for Amy Haddock, NP nurse to call her please at [phone]   Called and spoke to patient. Patient wanted to know the last time she was prescribed Florinal and I told her that it looks like 05/23/15. Patient states she does not need anything else from [name] right now.

## 2017-06-07 ENCOUNTER — Ambulatory Visit: Payer: Managed Care, Other (non HMO) | Admitting: Family Medicine

## 2017-06-07 ENCOUNTER — Encounter: Payer: Self-pay | Admitting: Family Medicine

## 2017-06-07 VITALS — BP 129/82 | HR 108 | Temp 98.2°F | Wt 146.4 lb

## 2017-06-07 DIAGNOSIS — S39012A Strain of muscle, fascia and tendon of lower back, initial encounter: Secondary | ICD-10-CM | POA: Diagnosis not present

## 2017-06-07 MED ORDER — CYCLOBENZAPRINE HCL 10 MG PO TABS: 10.0000 mg | ORAL_TABLET | Freq: Three times a day (TID) | ORAL | 0 refills | Status: DC | PRN

## 2017-06-07 MED ORDER — NAPROXEN 500 MG PO TBEC: 500.0000 mg | DELAYED_RELEASE_TABLET | Freq: Two times a day (BID) | ORAL | 0 refills | Status: DC

## 2017-06-07 NOTE — Progress Notes (Signed)
Date:  06/07/2017   Name:  Amy Hansen   DOB:  07/03/1956   MRN:  [id number]  PCP:  Kathrine Haddock, NP    Chief Complaint: Back Pain (pt states she has moving a box with help about 2 hours ago and heard a pop in her back while moving the box, pt states the box was about 60 pounds. Pt states she is in a lot of pain )   History of Present Illness:  This is a 61 y.o. female seen urgently for acute back pain which started when lifting a heavy object about noon today. Felt pop in back, pain worse since then. Pain bilateral, non-radiating, no prior hx back problems, has taking nothing for pain yet.   Review of Systems:  Review of Systems  Constitutional: Negative for fever.  Respiratory: Negative for shortness of breath.   Cardiovascular: Negative for chest pain.  Gastrointestinal: Negative for abdominal pain.  Genitourinary: Negative for difficulty urinating and dysuria.  Neurological: Negative for syncope and light-headedness.    Patient Active Problem List   Diagnosis Date Noted  . Atypical chest pain 03/05/2017  . GERD (gastroesophageal reflux disease) 02/18/2017  . Iron deficiency anemia 10/18/2016  . Foot swelling 07/17/2016  . Hypertension 03/21/2016  . Vitamin D deficiency 02/21/2016  . B12 deficiency 02/21/2016  . Incontinent of feces 02/21/2016  . Diabetes type 2, controlled ([place]) 09/13/2015  . Dysuria 06/14/2015  . Migraines 05/23/2015  . Restless legs syndrome (RLS) 05/23/2015  . Recurrent UTI 04/26/2015  . Atrophic vaginitis 04/26/2015  . Dyspareunia, female 04/26/2015    Prior to Admission medications   Medication Sig Start Date End Date Taking? Authorizing Provider  ALPRAZolam Duanne Moron) 0.5 MG tablet Take 0.5 mg by mouth at bedtime as needed for anxiety.   Yes [provider]  buPROPion (WELLBUTRIN XL) 150 MG 24 hr tablet Take 150 mg by mouth daily.    Yes [provider]  butalbital-aspirin-caffeine Acquanetta Chain) 50-325-40 MG capsule Take 1  capsule by mouth 2 (two) times daily as needed for headache. 05/20/17  Yes Kathrine Haddock, NP  BYDUREON 2 MG PEN INJECT SUBCUTANEOUSLY 2MG  ONCE A WEEK 10/30/16  Yes Kathrine Haddock, NP  fluticasone (FLONASE) 50 MCG/ACT nasal spray Place 2 sprays into the nose as needed.  08/07/14  Yes [provider]  furosemide (LASIX) 20 MG tablet Take 1 tablet (20 mg total) by mouth every other day. 03/05/17 03/05/18 Yes End, Harrell Gave, MD  lamoTRIgine (LAMICTAL) 200 MG tablet Take 150 mg by mouth. Take 2 tablets daily at bedtime 06/10/16  Yes [provider]  metFORMIN (GLUCOPHAGE) 1000 MG tablet TAKE 1 TABLET BY MOUTH TWO  TIMES DAILY WITH MEALS 10/30/16  Yes Kathrine Haddock, NP  Meth-Hyo-M Barnett Hatter Phos-Ph Sal (URETRON D/S) TABS Take 1 tablet by mouth every 6 (six) hours as needed. 04/18/17  Yes McGowan, Larene Beach A, PA-C  omeprazole (PRILOSEC OTC) 20 MG tablet Take 1 tablet (20 mg total) by mouth daily. 02/18/17  Yes Kathrine Haddock, NP  ondansetron (ZOFRAN ODT) 4 MG disintegrating tablet Take 1 tablet (4 mg total) by mouth every 8 (eight) hours as needed. 02/18/17  Yes Kathrine Haddock, NP  ONE TOUCH ULTRA TEST test strip daily. 03/30/16  Yes [provider]  pramipexole (MIRAPEX) 0.5 MG tablet Take 1 tablet (0.5 mg total) by mouth at bedtime. 02/18/17  Yes Kathrine Haddock, NP  risperiDONE (RISPERDAL) 1 MG tablet Take 1 mg by mouth at bedtime.   Yes [provider]  sertraline (ZOLOFT) 100 MG tablet Take 100 mg by mouth daily. 01/25/16  Yes [provider]  Vitamin D, Ergocalciferol, (DRISDOL) 50000 units CAPS capsule TAKE 1 CAPSULE BY MOUTH  EVERY 7 DAYS 08/22/16  Yes Kathrine Haddock, NP  zolpidem (AMBIEN) 10 MG tablet Take 10 mg by mouth at bedtime. 02/13/17  Yes [provider]  cyclobenzaprine (FLEXERIL) 10 MG tablet Take 1 tablet (10 mg total) 3 (three) times daily as needed by mouth for muscle spasms. 06/07/17   Dima Mini, Gwyndolyn Saxon, MD  naproxen (NAPROXEN DR) 500 MG EC tablet Take 1  tablet (500 mg total) 2 (two) times daily with a meal by mouth. 06/07/17   Adline Potter, MD    Allergies  Allergen Reactions  . Avelox [Moxifloxacin Hcl In Nacl] Anaphylaxis  . Imitrex [Sumatriptan] Other (See Comments)    Neck and shoulder pain  . Stadol [Butorphanol] Rash    Past Surgical History:  Procedure Laterality Date  . ABDOMINAL HYSTERECTOMY    . bariatric bypass  2012  . CARPAL TUNNEL RELEASE Right 2003  . CARPAL TUNNEL RELEASE Right    2008  . CHOLECYSTECTOMY  1975  . FL INJ LEFT KNEE CT ARTHROGRAM ([facility] HX) Left    1995  . GASTRIC BYPASS  2010  . [place]  2013  . KNEE ARTHROSCOPY Left 1996  . TONSILLECTOMY      Social History   Tobacco Use  . Smoking status: Former Smoker    Packs/day: 0.50    Types: Cigarettes    Last attempt to quit: 04/25/1975    Years since quitting: 42.1  . Smokeless tobacco: Never Used  . Tobacco comment: quit 40 years  Substance Use Topics  . Alcohol use: No    Alcohol/week: 0.0 oz  . Drug use: No    Family History  Problem Relation Age of Onset  . Stroke Father   . Heart failure Sister   . Bladder Cancer Neg Hx   . Kidney disease Neg Hx   . Prostate cancer Neg Hx   . Kidney cancer Neg Hx     Medication list has been reviewed and updated.  Physical Examination: BP 129/82   Pulse (!) 108   Temp 98.2 F (36.8 C) (Oral)   Wt 146 lb 6.4 oz (66.4 kg)   LMP  (LMP Unknown)   SpO2 98%   BMI 26.78 kg/m   Physical Exam  Constitutional: She appears well-developed and well-nourished.  Musculoskeletal:  Pain with B SLR, bent knee flexion, and hip rotation B paralumbar tenderness but spine non-tender  Neurological: She is alert.  Skin: Skin is warm and dry.  Psychiatric: She has a normal mood and affect. Her behavior is normal.  Nursing note and vitals reviewed.   Assessment and Plan:  1. Strain of lumbar region, initial encounter No evidence radiculopathy or compression fracture, begin Naprosyn bid x 5d  and Flexeril prn, call if sxs worsen/persist  Return if symptoms worsen or fail to improve.  Satira Anis. Vada Swift, [facility]  06/07/2017

## 2017-07-05 ENCOUNTER — Encounter: Payer: Self-pay | Admitting: Unknown Physician Specialty

## 2017-07-05 ENCOUNTER — Ambulatory Visit (INDEPENDENT_AMBULATORY_CARE_PROVIDER_SITE_OTHER): Payer: Self-pay | Admitting: Unknown Physician Specialty

## 2017-07-05 DIAGNOSIS — G43009 Migraine without aura, not intractable, without status migrainosus: Secondary | ICD-10-CM

## 2017-07-05 NOTE — Progress Notes (Signed)
   BP 120/84   Pulse 85   Temp 98.2 F (36.8 C) (Oral)   Wt 154 lb 3.2 oz (69.9 kg)   LMP  (LMP Unknown)   SpO2 98%   BMI 28.20 kg/m    Subjective:    Patient ID: Amy Hansen, female    DOB: July 26, 1956, 61 y.o.   MRN: [id number]  HPI: Amy Hansen is a 61 y.o. female  Chief Complaint  Patient presents with  . Medication Problem    pt wants to discuss fiorinal prescription with provider   Pt states she has been taking Fiorinall for many years for severe migraine headaches.  Therefore, when she had a drug screen and tested positive, she lost her job as her prescription at that time had expired.  She has not been able to make it to the neurologist as she lost her job. She would like a letter letting them know I was aware of her taking the occasional Fiornol and has an active prescription for this.    Relevant past medical, surgical, family and social history reviewed and updated as indicated. Interim medical history since our last visit reviewed. Allergies and medications reviewed and updated.  Review of Systems  Per HPI unless specifically indicated above     Objective:    BP 120/84   Pulse 85   Temp 98.2 F (36.8 C) (Oral)   Wt 154 lb 3.2 oz (69.9 kg)   LMP  (LMP Unknown)   SpO2 98%   BMI 28.20 kg/m   Wt Readings from Last 3 Encounters:  07/05/17 154 lb 3.2 oz (69.9 kg)  06/07/17 146 lb 6.4 oz (66.4 kg)  05/21/17 151 lb 12.8 oz (68.9 kg)    Physical Exam  Constitutional: She is oriented to person, place, and time. She appears well-developed and well-nourished. No distress.  HENT:  Head: Normocephalic and atraumatic.  Eyes: Conjunctivae and lids are normal. Right eye exhibits no discharge. Left eye exhibits no discharge. No scleral icterus.  Cardiovascular: Normal rate.  Pulmonary/Chest: Effort normal.  Abdominal: Normal appearance. There is no splenomegaly or hepatomegaly.  Musculoskeletal: Normal range of motion.  Neurological: She is alert and oriented to  person, place, and time.  Skin: Skin is intact. No rash noted. No pallor.  Psychiatric: She has a normal mood and affect. Her behavior is normal. Judgment and thought content normal.    Results for orders placed or performed in visit on 05/21/17  Bayer DCA Hb A1c Waived  Result Value Ref Range   Bayer DCA Hb A1c Waived 6.4 <7.0 %      Assessment & Plan:   Problem List Items Addressed This Visit      Unprioritized   Migraines    Discussed migraines.  Will refer again to neurology when she has a job.  Continue current medications          Follow up plan: Return if symptoms worsen or fail to improve.

## 2017-07-05 NOTE — Assessment & Plan Note (Addendum)
Discussed migraines.  Will refer again to neurology when she has a job.  Continue current medications

## 2017-07-17 ENCOUNTER — Telehealth: Payer: Self-pay | Admitting: Oncology

## 2017-07-17 NOTE — Telephone Encounter (Signed)
Cancel appt, per patient request. Per patient, will call back to reschedule. MF

## 2017-07-26 ENCOUNTER — Other Ambulatory Visit: Payer: Self-pay | Admitting: Unknown Physician Specialty

## 2017-07-26 DIAGNOSIS — E119 Type 2 diabetes mellitus without complications: Secondary | ICD-10-CM

## 2017-08-02 ENCOUNTER — Ambulatory Visit: Payer: Managed Care, Other (non HMO) | Admitting: Oncology

## 2017-08-10 ENCOUNTER — Other Ambulatory Visit: Payer: Self-pay | Admitting: Unknown Physician Specialty

## 2017-08-16 ENCOUNTER — Telehealth: Payer: Self-pay | Admitting: Internal Medicine

## 2017-08-16 MED ORDER — FUROSEMIDE 20 MG PO TABS: 20.0000 mg | ORAL_TABLET | ORAL | 3 refills | Status: DC

## 2017-08-16 NOTE — Telephone Encounter (Signed)
Patient called in needing refills of her fluid pill. Reviewed chart and instructions and refills sent in to her pharmacy of choice. She had no further questions or concerns at this time.

## 2017-10-28 ENCOUNTER — Ambulatory Visit
Admission: RE | Admit: 2017-10-28 | Discharge: 2017-10-28 | Disposition: A | Discharge status: Home / Self Care | Payer: Self-pay | Source: Ambulatory Visit | Attending: Unknown Physician Specialty | Admitting: Unknown Physician Specialty

## 2017-10-28 ENCOUNTER — Ambulatory Visit (INDEPENDENT_AMBULATORY_CARE_PROVIDER_SITE_OTHER): Payer: Self-pay | Admitting: Unknown Physician Specialty

## 2017-10-28 ENCOUNTER — Encounter: Payer: Self-pay | Admitting: Unknown Physician Specialty

## 2017-10-28 VITALS — BP 109/72 | HR 84 | Temp 99.0°F | Wt 150.6 lb

## 2017-10-28 DIAGNOSIS — R0781 Pleurodynia: Secondary | ICD-10-CM

## 2017-10-28 DIAGNOSIS — G43009 Migraine without aura, not intractable, without status migrainosus: Secondary | ICD-10-CM

## 2017-10-28 IMAGING — DX DG RIBS 2V*L*
4 series · 4 of 4 positions shown · non-contrast
Comparison: None.

CLINICAL DATA: Left-sided chest pain, no known injury, initial
encounter

EXAM:
LEFT RIBS - 2 VIEW

[chest ap]
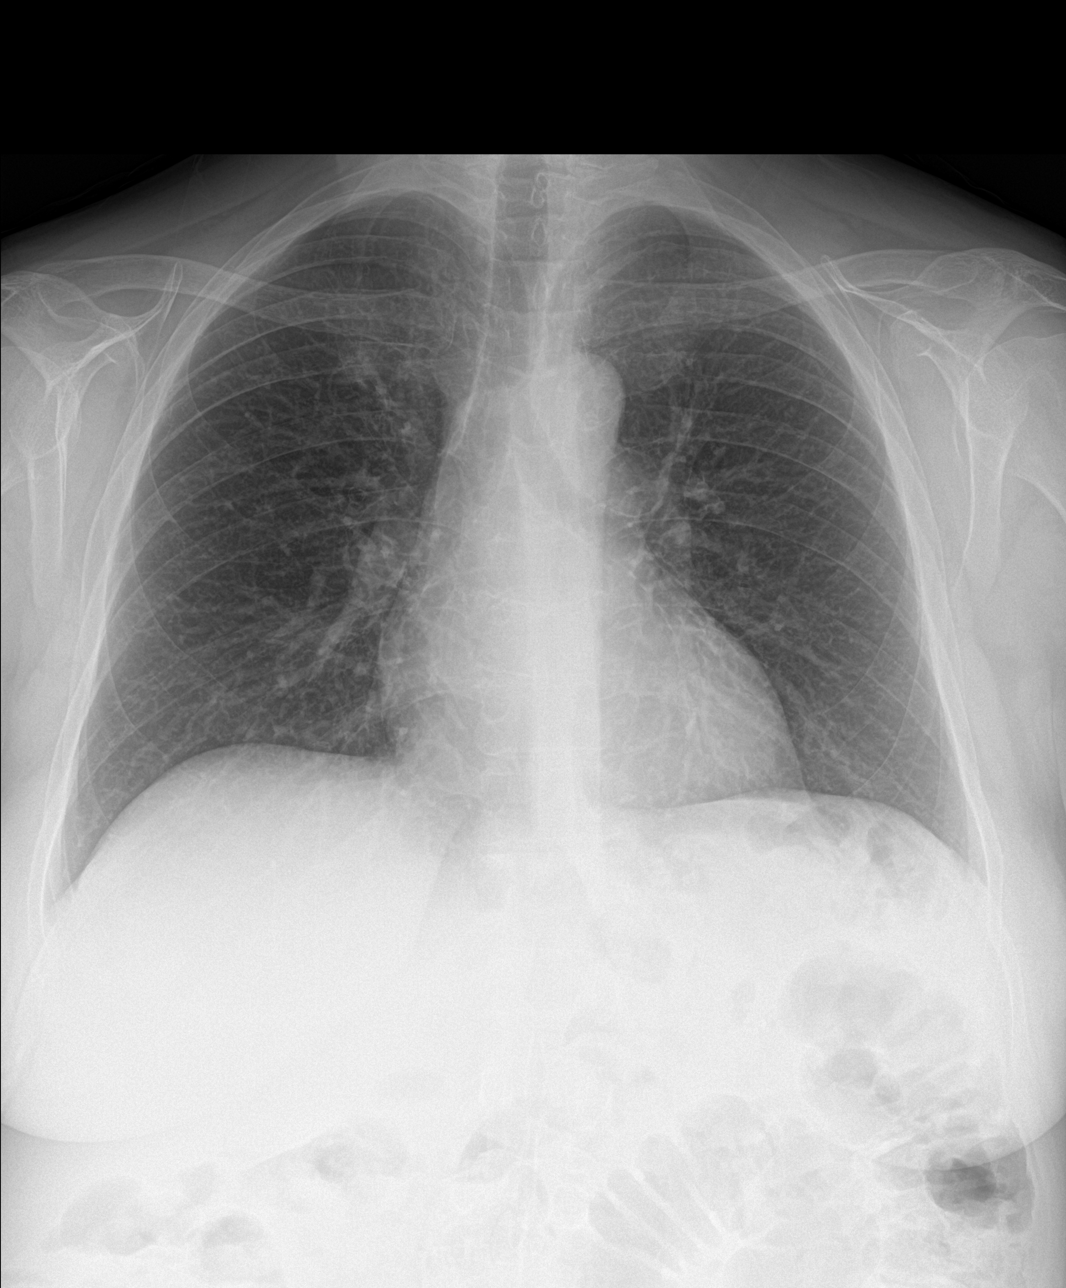

[rib ap (1 of 2)]
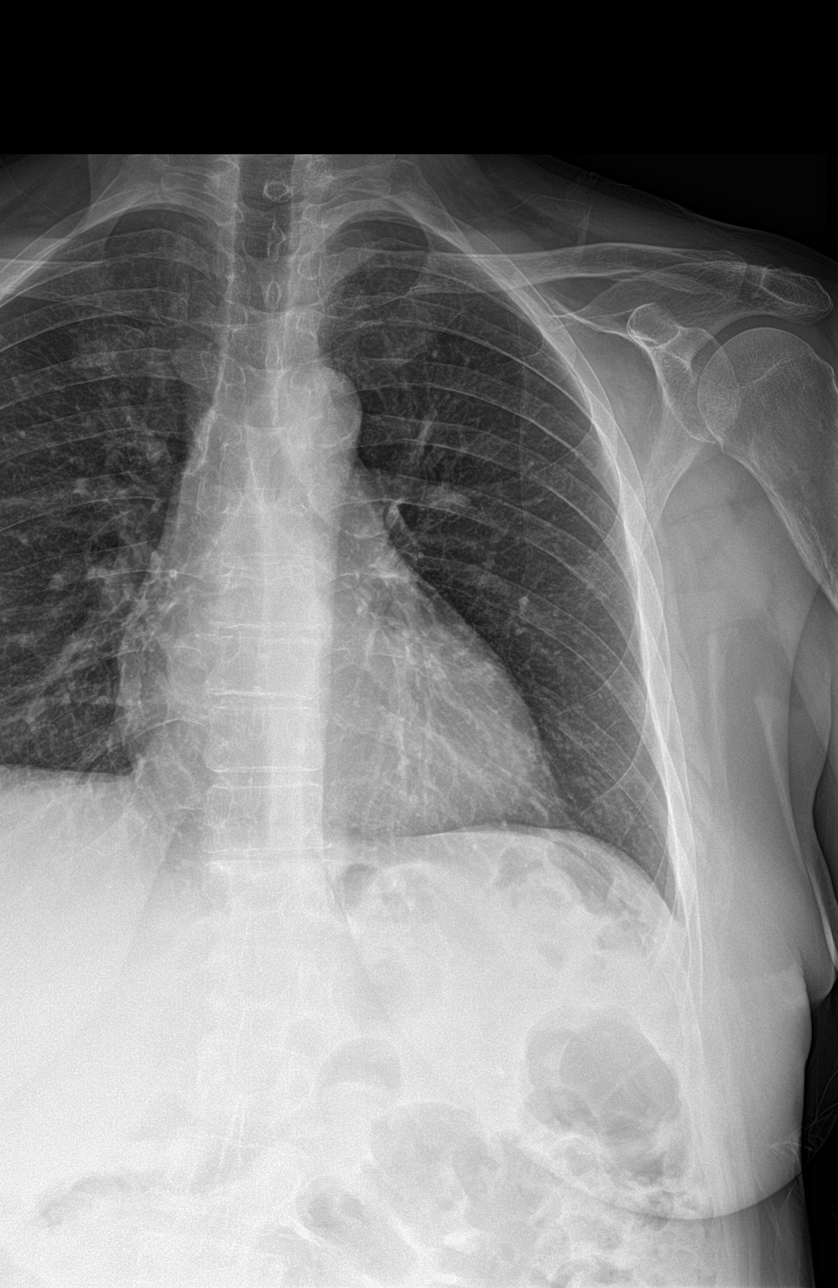

[rib ap (2 of 2)]
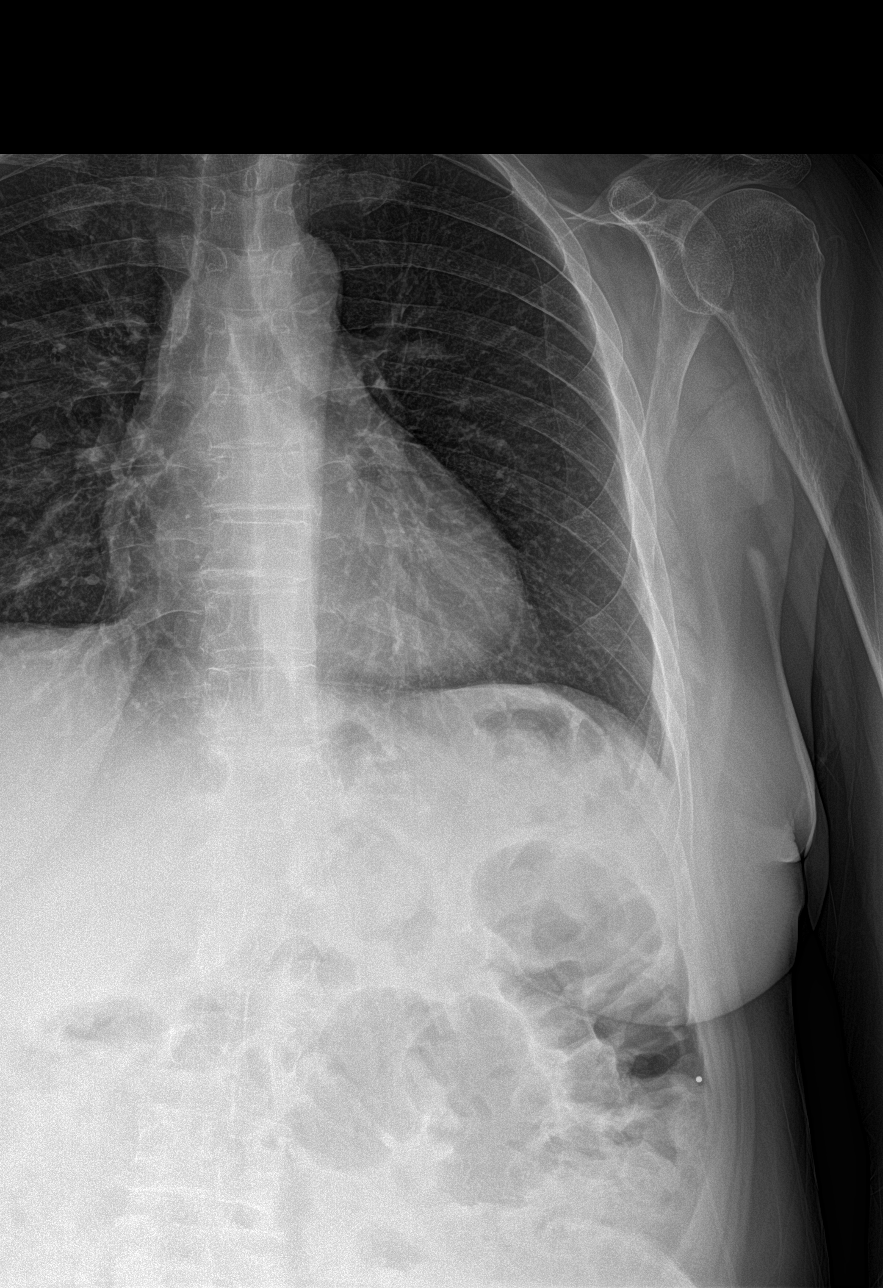

[rib obl]
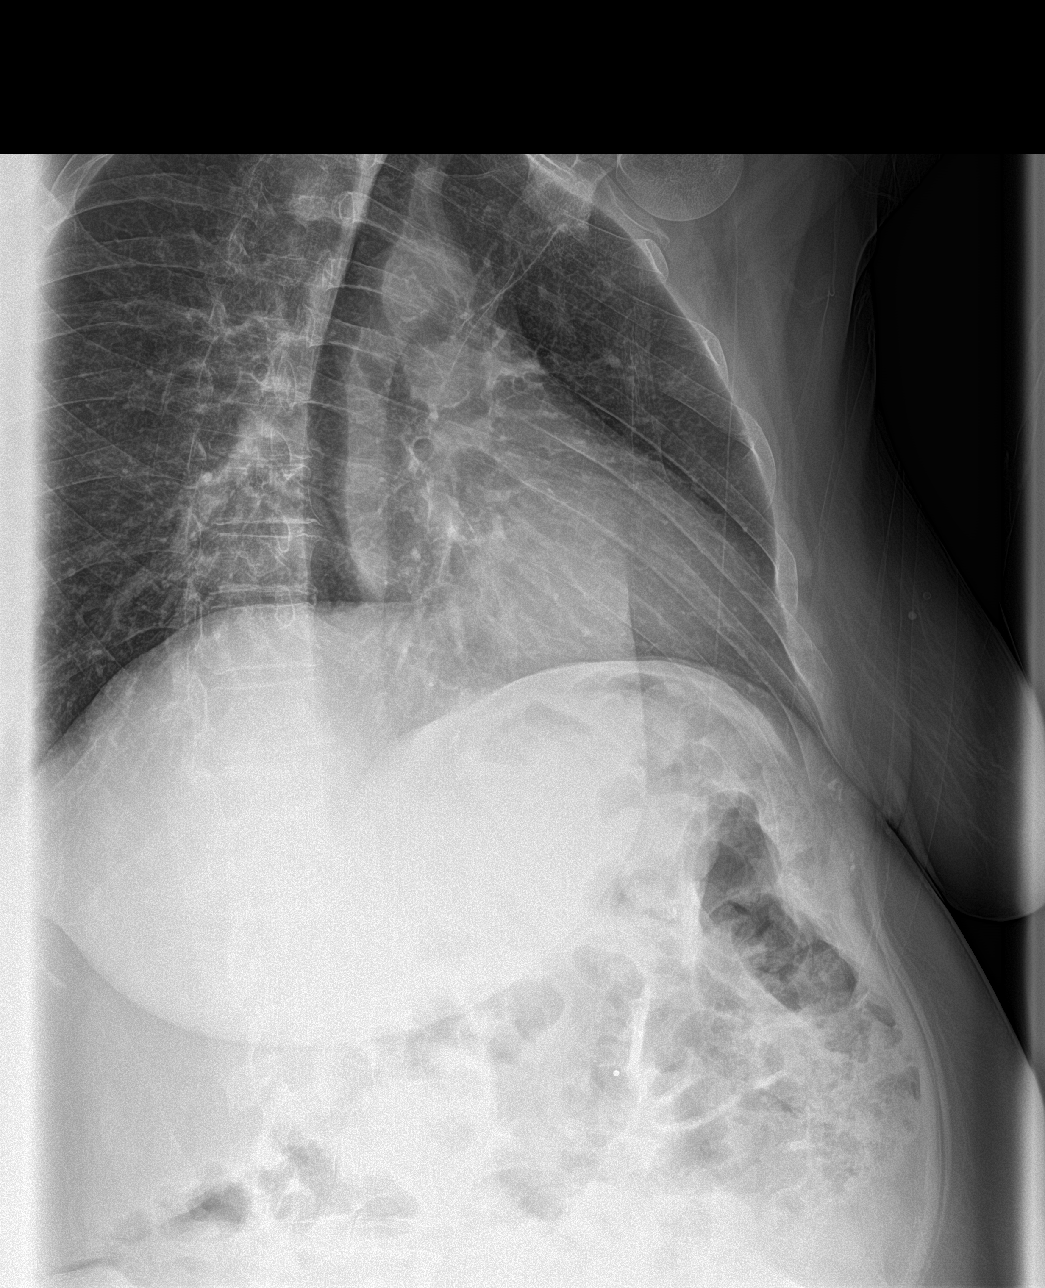

[4 of 4 positions shown; findings below may reference images not displayed]

FINDINGS: Cardiac shadow is within normal limits. The lungs are well aerated
bilaterally. No focal infiltrate, effusion or pneumothorax is seen.
No acute rib fracture is noted.
IMPRESSION: No acute abnormality noted.

## 2017-10-28 MED ORDER — CYCLOBENZAPRINE HCL 10 MG PO TABS: 10.0000 mg | ORAL_TABLET | Freq: Three times a day (TID) | ORAL | 0 refills | Status: DC | PRN

## 2017-10-28 MED ORDER — BUTALBITAL-ASPIRIN-CAFFEINE 50-325-40 MG PO CAPS: ORAL_CAPSULE | ORAL | 1 refills | Status: DC

## 2017-10-28 MED ORDER — ONDANSETRON 4 MG PO TBDP: 4.0000 mg | ORAL_TABLET | Freq: Three times a day (TID) | ORAL | 0 refills | Status: DC | PRN

## 2017-10-28 NOTE — Assessment & Plan Note (Signed)
Improved.  Refill medications.

## 2017-10-28 NOTE — Progress Notes (Signed)
BP 109/72   Pulse 84   Temp 99 F (37.2 C) (Oral)   Wt 150 lb 9.6 oz (68.3 kg)   LMP  (LMP Unknown)   SpO2 97%   BMI 27.55 kg/m    Subjective:    Patient ID: Amy Hansen, female    DOB: 1955/11/17, 62 y.o.   MRN: [id number]  HPI: Amy Hansen is a 62 y.o. female  Chief Complaint  Patient presents with  . Back Pain    pt states she felt a pain or knot on the upper left part of her back this morning  . Medication Refill    pt states she needs zofran and fiorinal refilled   Pt with left sided pain.  States she was hurting in her upper back.  When she touched her left side against the doorway it took her breath away.  State it is a nauseating pain which seemed to start suddenly.  Hurts to do anything.     Migraine Needs refill of Fiorinol and Zofran. States migraines are better since not working.     Relevant past medical, surgical, family and social history reviewed and updated as indicated. Interim medical history since our last visit reviewed. Allergies and medications reviewed and updated.  Review of Systems  Per HPI unless specifically indicated above     Objective:    BP 109/72   Pulse 84   Temp 99 F (37.2 C) (Oral)   Wt 150 lb 9.6 oz (68.3 kg)   LMP  (LMP Unknown)   SpO2 97%   BMI 27.55 kg/m   Wt Readings from Last 3 Encounters:  10/28/17 150 lb 9.6 oz (68.3 kg)  07/05/17 154 lb 3.2 oz (69.9 kg)  06/07/17 146 lb 6.4 oz (66.4 kg)    Physical Exam  Constitutional: She is oriented to person, place, and time. She appears well-developed and well-nourished. No distress.  HENT:  Head: Normocephalic and atraumatic.  Eyes: Conjunctivae and lids are normal. Right eye exhibits no discharge. Left eye exhibits no discharge. No scleral icterus.  Neck: Normal range of motion. Neck supple. No JVD present. Carotid bruit is not present.  Cardiovascular: Normal rate, regular rhythm and normal heart sounds.  Pulmonary/Chest: Effort normal and breath sounds normal.    Pt with point tenderness left lateral lower rib cage.    Abdominal: Normal appearance. There is no splenomegaly or hepatomegaly.  Musculoskeletal: Normal range of motion.  Neurological: She is alert and oriented to person, place, and time.  Skin: Skin is warm, dry and intact. No rash noted. No pallor.  Psychiatric: She has a normal mood and affect. Her behavior is normal. Judgment and thought content normal.    Results for orders placed or performed in visit on 05/21/17  Bayer DCA Hb A1c Waived  Result Value Ref Range   Bayer DCA Hb A1c Waived 6.4 <7.0 %      Assessment & Plan:   Problem List Items Addressed This Visit      Unprioritized   Migraines    Improved.  Refill medications.        Relevant Medications   butalbital-aspirin-caffeine (FIORINAL) 50-325-40 MG capsule   cyclobenzaprine (FLEXERIL) 10 MG tablet    Other Visit Diagnoses    Rib pain on left side    -  Primary   New problem.  Lidoderm patches, heat, and aspercreme.  Flexeril for severe pain.  Told to be alert to rash indicating zoster reaction   Relevant Orders  DG Ribs Unilateral Left   DG Chest 2 View       Follow up plan: Return if symptoms worsen or fail to improve.

## 2017-10-31 ENCOUNTER — Telehealth: Payer: Self-pay | Admitting: Unknown Physician Specialty

## 2017-10-31 ENCOUNTER — Other Ambulatory Visit: Payer: Self-pay | Admitting: Unknown Physician Specialty

## 2017-10-31 MED ORDER — CYCLOBENZAPRINE HCL 10 MG PO TABS: 10.0000 mg | ORAL_TABLET | Freq: Three times a day (TID) | ORAL | 0 refills | Status: DC | PRN

## 2017-10-31 NOTE — Telephone Encounter (Signed)
Called and spoke to patient. I let her know what Malachy Mood said and patient asked if the prescription could be sent to [address] instead. Will route back to [facility] and Dr. Wynetta Emery since Malachy Mood is not in and may not see this message until tomorrow.

## 2017-10-31 NOTE — Telephone Encounter (Signed)
Copied from [place] [id number]. Topic: Quick Communication - Rx Refill/Question >> Oct 31, 2017  9:38 AM Cecelia Byars, NT wrote: Medication:  cyclobenzaprine (FLEXERIL) 10 MG tablet  Has the patient contacted their pharmacy? {yes  (Agent: If no, request that the patient contact the pharmacy for the refill.) Preferred Pharmacy (with phone number or street name): [facility] [address] [place] - [place] [phone] (Phone) [phone] (Fax Agent: Please be advised that RX refills may take up to 3 business days. We ask that you follow-up with your pharmacy.

## 2017-10-31 NOTE — Telephone Encounter (Signed)
The x-ray was normal.  I apologize.  I thought I asked someone to call and let her know.  Lidoderm patches may be useful.  You can get them OTC at the pharmacy.  Happy to refill the Flexeril.  Yes, we can certainly see her again for this.  I would give it a little more time.  Called rx to [facility] [address]

## 2017-10-31 NOTE — Addendum Note (Signed)
Addended by: Georgina Peer on: 10/31/2017 11:03 AM   Modules accepted: Orders

## 2017-10-31 NOTE — Telephone Encounter (Signed)
Left detailed message for patient.

## 2017-10-31 NOTE — Addendum Note (Signed)
Addended by: Kathrine Haddock on: 10/31/2017 10:40 AM   Modules accepted: Orders

## 2017-10-31 NOTE — Telephone Encounter (Signed)
Pt requested refill of Flexeril. Seen by C. Wicker on 10/28/17 for left sided rib area pain. Call back to pt to discuss symptoms, need for additional medication. Pt stated the spasms "seen to be worsening" in intensity and frequency. Questioning if needs to be seen again. Also asking for results of X-Ray done; unable to give results as not released to [place].  Please advise: [phone]

## 2017-10-31 NOTE — Telephone Encounter (Signed)
Left message on machine.

## 2017-11-04 ENCOUNTER — Encounter: Payer: Self-pay | Admitting: Unknown Physician Specialty

## 2017-11-04 ENCOUNTER — Ambulatory Visit (INDEPENDENT_AMBULATORY_CARE_PROVIDER_SITE_OTHER): Payer: Self-pay | Admitting: Unknown Physician Specialty

## 2017-11-04 VITALS — BP 113/75 | HR 112 | Temp 98.0°F | Ht 62.0 in | Wt 152.3 lb

## 2017-11-04 DIAGNOSIS — R300 Dysuria: Secondary | ICD-10-CM

## 2017-11-04 DIAGNOSIS — N390 Urinary tract infection, site not specified: Secondary | ICD-10-CM

## 2017-11-04 DIAGNOSIS — R0789 Other chest pain: Secondary | ICD-10-CM

## 2017-11-04 DIAGNOSIS — M79605 Pain in left leg: Secondary | ICD-10-CM | POA: Insufficient documentation

## 2017-11-04 DIAGNOSIS — N3000 Acute cystitis without hematuria: Secondary | ICD-10-CM

## 2017-11-04 MED ORDER — SULFAMETHOXAZOLE-TRIMETHOPRIM 800-160 MG PO TABS: 1.0000 | ORAL_TABLET | Freq: Two times a day (BID) | ORAL | 0 refills | Status: DC

## 2017-11-04 MED ORDER — NITROFURANTOIN MACROCRYSTAL 50 MG PO CAPS: 50.0000 mg | ORAL_CAPSULE | Freq: Four times a day (QID) | ORAL | 2 refills | Status: DC

## 2017-11-04 MED ORDER — GABAPENTIN 300 MG PO CAPS: 300.0000 mg | ORAL_CAPSULE | Freq: Every day | ORAL | 3 refills | Status: DC

## 2017-11-04 NOTE — Patient Instructions (Signed)
Dr Francisca December:  [phone]

## 2017-11-04 NOTE — Assessment & Plan Note (Signed)
Start Nitrofurantoin 50 mg daily

## 2017-11-04 NOTE — Assessment & Plan Note (Addendum)
Appears musculoskeletal.  Get a D dimer to r/o PE.  Unable to do a CT due to costs.  Recommended Dr. Francisca December for chiropractic care.  Stretching discussed.  Gabapentin QHS.  Titrate up as needed.

## 2017-11-04 NOTE — Progress Notes (Signed)
BP 113/75   Pulse (!) 112   Temp 98 F (36.7 C) (Oral)   Ht 5' 2"  (1.575 m)   Wt 152 lb 4.8 oz (69.1 kg)   LMP  (LMP Unknown)   SpO2 98%   BMI 27.86 kg/m    Subjective:    Patient ID: Amy Hansen, female    DOB: [birth year], 62 y.o.   MRN: [id number]  HPI: Amy Hansen is a 62 y.o. female  Chief Complaint  Patient presents with  . Urinary Tract Infection    pt states she has had burning and a foul odor since yesterday   Pt with complaints of left chest wall pain.  She was improving but it "popped again" and felt severe pain.  No rash.  Rib films were negative.  Lidoderm patches OTC are helping.  Again gradually improving.    Urinary Tract Infection   This is a new problem. The current episode started today. The problem occurs every urination. Associated symptoms include frequency. Pertinent negatives include no chills, discharge, flank pain, hematuria, hesitancy, nausea, possible pregnancy, sweats, urgency or vomiting. Associated symptoms comments: Burning and odor. She has tried nothing for the symptoms. Her past medical history is significant for recurrent UTIs. Had been on preventative maintenance in the past and would like to try that again    Relevant past medical, surgical, family and social history reviewed and updated as indicated. Interim medical history since our last visit reviewed. Allergies and medications reviewed and updated.  Review of Systems  Constitutional: Negative for chills.  Gastrointestinal: Negative for nausea and vomiting.  Genitourinary: Positive for frequency. Negative for flank pain, hematuria, hesitancy and urgency.    Per HPI unless specifically indicated above     Objective:    BP 113/75   Pulse (!) 112   Temp 98 F (36.7 C) (Oral)   Ht 5' 2"  (1.575 m)   Wt 152 lb 4.8 oz (69.1 kg)   LMP  (LMP Unknown)   SpO2 98%   BMI 27.86 kg/m   Wt Readings from Last 3 Encounters:  11/04/17 152 lb 4.8 oz (69.1 kg)  10/28/17 150 lb 9.6 oz  (68.3 kg)  07/05/17 154 lb 3.2 oz (69.9 kg)    Physical Exam  Constitutional: She is oriented to person, place, and time. She appears well-developed and well-nourished. No distress.  HENT:  Head: Normocephalic and atraumatic.  Eyes: Conjunctivae and lids are normal. Right eye exhibits no discharge. Left eye exhibits no discharge. No scleral icterus.  Neck: Normal range of motion. Neck supple. No JVD present. Carotid bruit is not present.  Cardiovascular: Normal rate, regular rhythm and normal heart sounds.  Pulmonary/Chest: Effort normal and breath sounds normal.  Pt with left lower chest wall tenderness.  No CVA  Abdominal: Normal appearance. There is no splenomegaly or hepatomegaly.  Musculoskeletal: Normal range of motion.  Neurological: She is alert and oriented to person, place, and time.  Skin: Skin is warm, dry and intact. No rash noted. No pallor.  Psychiatric: She has a normal mood and affect. Her behavior is normal. Judgment and thought content normal.   Urine is positive for Nitratee, Leukocytes, and WBC  Results for orders placed or performed in visit on 05/21/17  Bayer DCA Hb A1c Waived  Result Value Ref Range   Bayer DCA Hb A1c Waived 6.4 <7.0 %      Assessment & Plan:   Problem List Items Addressed This Visit  Unprioritized   Left-sided chest wall pain    Appears musculoskeletal.  Get a D dimer to r/o PE.  Unable to do a CT due to costs.  Recommended Dr. Francisca December for chiropractic care.  Stretching discussed.  Gabapentin QHS.  Titrate up as needed.        Relevant Orders   D-Dimer, Quantitative   Recurrent UTI    Start Nitrofurantoin 50 mg daily      Relevant Medications   nitrofurantoin (MACRODANTIN) 50 MG capsule   sulfamethoxazole-trimethoprim (BACTRIM DS,SEPTRA DS) 800-160 MG tablet    Other Visit Diagnoses    Burning with urination    -  Primary   Relevant Orders   UA/M w/rflx Culture, Routine   Acute cystitis without hematuria       Urine  positive.  Pt ed on fluids.  Rx for Bactrim for 5 days.         Follow up plan: Return if symptoms worsen or fail to improve.

## 2017-11-05 LAB — D-DIMER, QUANTITATIVE (NOT AT ARMC): D-DIMER: 0.38 mg{FEU}/L (ref 0.00–0.49)

## 2017-11-06 LAB — MICROSCOPIC EXAMINATION: RBC MICROSCOPIC, UA: NONE SEEN /HPF (ref 0–2)

## 2017-11-06 LAB — URINE CULTURE, REFLEX

## 2017-11-06 LAB — UA/M W/RFLX CULTURE, ROUTINE
Bilirubin, UA: NEGATIVE
Ketones, UA: NEGATIVE
NITRITE UA: POSITIVE — AB
PH UA: 5 (ref 5.0–7.5)
Protein, UA: NEGATIVE
RBC, UA: NEGATIVE
Specific Gravity, UA: 1.015 (ref 1.005–1.030)
Urobilinogen, Ur: 0.2 mg/dL (ref 0.2–1.0)

## 2017-11-12 ENCOUNTER — Other Ambulatory Visit: Payer: Self-pay | Admitting: Unknown Physician Specialty

## 2017-11-12 NOTE — Telephone Encounter (Signed)
Left message to call back to office to review symptoms.

## 2017-11-12 NOTE — Telephone Encounter (Signed)
Copied from [place]. Topic: Quick Communication - Rx Refill/Question >> Nov 12, 2017  2:08 PM Margot Ables wrote: Medication: cyclobenzaprine (FLEXERIL) 10 MG tablet - pt is taking 2-3 per day. She only has 3 left.  Has the patient contacted their pharmacy? No - no refills Preferred Pharmacy (with phone number or street name): [facility] [address], [place] [phone] (Phone) [phone] (Fax)

## 2017-11-12 NOTE — Telephone Encounter (Signed)
Patient returned call. I asked how often is she taking the flexeril, she says "3 times a day. I am getting some better, but at times I turn and it catches and hurt. I was just wanting a refill on the medicine." I advised it was noted to return if symptoms are worse or not improving, she says "they are some better." I advised this would be sent to the provider and if she has any other questions or recommendations, someone will call with those, she verbalized understanding.

## 2017-11-13 MED ORDER — CYCLOBENZAPRINE HCL 10 MG PO TABS: 10.0000 mg | ORAL_TABLET | Freq: Three times a day (TID) | ORAL | 0 refills | Status: DC | PRN

## 2017-11-19 ENCOUNTER — Ambulatory Visit: Payer: Managed Care, Other (non HMO) | Admitting: Unknown Physician Specialty

## 2017-12-06 ENCOUNTER — Telehealth: Payer: Self-pay | Admitting: Unknown Physician Specialty

## 2017-12-06 NOTE — Telephone Encounter (Signed)
She can get the omeprazole OTC. The fiorinol is a controlled substance. I would need to see her to refill it, but Malachy Mood may be willing to on Monday without an appointment.

## 2017-12-06 NOTE — Telephone Encounter (Signed)
Patient notified and verbalized understanding. Routing to [employer] as FYI.

## 2017-12-06 NOTE — Telephone Encounter (Signed)
Copied from [place] [id number]. Topic: Quick Communication - Rx Refill/Question >> Dec 06, 2017 11:44 AM Boyd Kerbs wrote: Medication:  omeprazole (PRILOSEC OTC) 20 MG tablet Migraine medication  She does not have insurance right now and can not come in right now for OV.  She said she is not having any problems with her Diabetes right now   Has the patient contacted their pharmacy? No. (Agent: If no, request that the patient contact the pharmacy for the refill.) Preferred Pharmacy (with phone number or street name):  [facility] [address] [place] [address] Phone: [phone] Fax: [phone]   Agent: Please be advised that RX refills may take up to 3 business days. We ask that you follow-up with your pharmacy.

## 2017-12-06 NOTE — Telephone Encounter (Signed)
Routing to provider  

## 2017-12-09 ENCOUNTER — Ambulatory Visit: Payer: Self-pay | Admitting: Unknown Physician Specialty

## 2017-12-09 MED ORDER — BUTALBITAL-ASPIRIN-CAFFEINE 50-325-40 MG PO CAPS: ORAL_CAPSULE | ORAL | 1 refills | Status: DC

## 2017-12-10 ENCOUNTER — Other Ambulatory Visit: Payer: Self-pay | Admitting: Unknown Physician Specialty

## 2018-01-13 ENCOUNTER — Telehealth: Payer: Self-pay | Admitting: Unknown Physician Specialty

## 2018-01-13 NOTE — Telephone Encounter (Signed)
Copied from [place] [id number]. Topic: Quick Communication - Rx Refill/Question >> Jan 13, 2018 11:57 AM Antonieta Iba C wrote: Medication: cyclobenzaprine (FLEXERIL) 10 MG tablet --pt is self pay, she said that she was seen for a back injury, pt has an apt with PT but would like a Rx until.   Has the patient contacted their pharmacy? No  (Agent: If no, request that the patient contact the pharmacy for the refill.) (Agent: If yes, when and what did the pharmacy advise?)  Preferred Pharmacy (with phone number or street name): [facility] [address], [place] - [place] [phone] (Phone) [phone] (Fax)      Agent: Please be advised that RX refills may take up to 3 business days. We ask that you follow-up with your pharmacy.

## 2018-01-14 MED ORDER — CYCLOBENZAPRINE HCL 10 MG PO TABS: 10.0000 mg | ORAL_TABLET | Freq: Three times a day (TID) | ORAL | 0 refills | Status: DC | PRN

## 2018-01-14 NOTE — Telephone Encounter (Signed)
LOV 11/04/17 Amy Hansen Last refill 11/13/17  # 30 with 0 refill

## 2018-01-15 ENCOUNTER — Telehealth: Payer: Self-pay | Admitting: Internal Medicine

## 2018-01-15 NOTE — Telephone Encounter (Signed)
Copied from [place]. Topic: Inquiry >> Jan 15, 2018  6:19 PM Cecelia Byars, NT wrote: Reason for CRM: Patient called to follow up on the prescription for flexeril that was sent to [place], [place] instead of [place], [place] - [place] [phone] (Phone  , [phone] (Fax) as she requested

## 2018-01-15 NOTE — Telephone Encounter (Signed)
This med needs to go to [name] on [address].  Pt is no longer employed and needs to go to [facility] [address] [facility] [phone] She is wanting to get this medicine today

## 2018-01-16 ENCOUNTER — Telehealth: Payer: Self-pay | Admitting: Internal Medicine

## 2018-01-16 ENCOUNTER — Other Ambulatory Visit: Payer: Self-pay | Admitting: Unknown Physician Specialty

## 2018-01-16 NOTE — Telephone Encounter (Signed)
Has already been sent to [facility]. She needs to call [facility].

## 2018-01-16 NOTE — Telephone Encounter (Signed)
Copied from [place] [id number]. Topic: General - Other >> Jan 16, 2018  6:12 PM Cecelia Byars, NT wrote: Reason for CRM: Patient has called and said her migraine to medicine was sent to the pharmacy instead of the cyclobenzaprine (FLEXERIL) 10 MG tablet , please resend to [place], [place] - [place] [phone] (Phone) [phone] (Fax

## 2018-01-21 MED ORDER — CYCLOBENZAPRINE HCL 10 MG PO TABS: 10.0000 mg | ORAL_TABLET | Freq: Three times a day (TID) | ORAL | 0 refills | Status: DC | PRN

## 2018-01-21 NOTE — Telephone Encounter (Signed)
Can you please make sure that she got this- it had been sent to [facility] already for her.

## 2018-01-21 NOTE — Telephone Encounter (Signed)
The cyclobenzaprine was sent to [facility] and patient is requesting [facility]. Can we resend for her please?

## 2018-02-11 ENCOUNTER — Ambulatory Visit: Payer: Self-pay | Admitting: Family Medicine

## 2018-02-13 ENCOUNTER — Telehealth: Payer: Self-pay | Admitting: Unknown Physician Specialty

## 2018-02-13 NOTE — Telephone Encounter (Signed)
Copied from [place] [id number]. Topic: Quick Communication - Rx Refill/Question >> Feb 13, 2018  4:26 PM Waylan Rocher, Lumin L wrote: Medication: metFORMIN (GLUCOPHAGE) 1000 MG tablet (Out of script)  Has the patient contacted their pharmacy? Yes.   (Agent: If no, request that the patient contact the pharmacy for the refill.) (Agent: If yes, when and what did the pharmacy advise?)  Preferred Pharmacy (with phone number or street name): [name], [place] - [id number] [place] [address] Phone: [phone] Fax: [phone]   Agent: Please be advised that RX refills may take up to 3 business days. We ask that you follow-up with your pharmacy.

## 2018-02-14 ENCOUNTER — Other Ambulatory Visit: Payer: Self-pay | Admitting: *Deleted

## 2018-02-14 DIAGNOSIS — E119 Type 2 diabetes mellitus without complications: Secondary | ICD-10-CM

## 2018-02-14 MED ORDER — METFORMIN HCL 1000 MG PO TABS: ORAL_TABLET | ORAL | 0 refills | Status: DC

## 2018-02-14 NOTE — Telephone Encounter (Signed)
Rx refilled per protocol- lab 05/21/17

## 2018-02-18 ENCOUNTER — Ambulatory Visit: Payer: Self-pay | Admitting: *Deleted

## 2018-02-18 NOTE — Telephone Encounter (Signed)
Patient is calling to report that she has lost her insurance- she stopped her injectable and she has not been checking her glucose levels. Patient did urine dip today and she has ketones in her urine. She states she has been having fatigue, thrist and some confusion. Office has no open appointments today- patient advised to go to ED for her symptoms. She is going to ED - knowing they may send her next door to ED.  Patient may need help from office for supplies and insulin. Not insured.  Reason for Disposition . Patient sounds very sick or weak to the triager  Answer Assessment - Initial Assessment Questions 1. BLOOD GLUCOSE: "What is your blood glucose level?"      High glucose- patient has not been able to check glucose levels for a weeks 2. ONSET: "When did you check the blood glucose?"     Patient was checking for UTI and she noticed she has ketones as well 3. USUAL RANGE: "What is your glucose level usually?" (e.g., usual fasting morning value, usual evening value)     unknown 4. KETONES: "Do you check for ketones (urine or blood test strips)?" If yes, ask: "What does the test show now?"      Just today- + ketones 5. TYPE 1 or 2:  "Do you know what type of diabetes you have?"  (e.g., Type 1, Type 2, Gestational; doesn't know)      Type 2 6. INSULIN: "Do you take insulin?" "What type of insulin(s) do you use? What is the mode of delivery? (syringe, pen (e.g., injection or  pump)?"      No- she was on injections- but lost insurance and stopped medication 7. DIABETES PILLS: "Do you take any pills for your diabetes?" If yes, ask: "Have you missed taking any pills recently?"     Metformin- she is taking that regularly 8. OTHER SYMPTOMS: "Do you have any symptoms?" (e.g., fever, frequent urination, difficulty breathing, dizziness, weakness, vomiting)     Fatigue, thirst , some confusion 9. PREGNANCY: "Is there any chance you are pregnant?" "When was your last menstrual period?"      n/a  Protocols used: DIABETES - HIGH BLOOD SUGAR-A-AH

## 2018-02-28 ENCOUNTER — Other Ambulatory Visit: Payer: Self-pay | Admitting: Unknown Physician Specialty

## 2018-02-28 NOTE — Telephone Encounter (Signed)
Fiorinal 50-325-40 mg refill request  LOV 10/28/17 with Kathrine Haddock  LR:  12/09/17   #14   Refills:  1  [facility], [place]

## 2018-02-28 NOTE — Telephone Encounter (Signed)
Patient notified.  Patient understood and stated she would call back for an appointment.

## 2018-02-28 NOTE — Telephone Encounter (Signed)
Have done controlled substance search. Fairly heavy usage in the month of May with Rx filled every two weeks. Looks like usage has dropped off, I'm willing to fill this prescription once per month. Using this medication more than 4 times a month puts her at risk for worsening rebound headaches, should be used sparingly. She should come in for OV if her migraines are worsening.

## 2018-03-04 ENCOUNTER — Other Ambulatory Visit: Payer: Self-pay

## 2018-03-04 ENCOUNTER — Telehealth: Payer: Self-pay | Admitting: Internal Medicine

## 2018-03-04 MED ORDER — FUROSEMIDE 20 MG PO TABS: 20.0000 mg | ORAL_TABLET | ORAL | 0 refills | Status: DC

## 2018-03-04 NOTE — Telephone Encounter (Signed)
°*  STAT* If patient is at the pharmacy, call can be transferred to refill team.   1. Which medications need to be refilled? (please list name of each medication and dose if known)  Lasix 20 mg po everyother day   2. Which pharmacy/location (including street and city if local pharmacy) is medication to be sent to? [facility] [address]   3. Do they need a 30 day or 90 day supply? [place]

## 2018-03-04 NOTE — Telephone Encounter (Signed)
furosemide (LASIX) 20 MG tablet 45 tablet 0 03/04/2018 06/02/2018   Sig - Route: Take 1 tablet (20 mg total) by mouth every other day. - Oral   Sent to pharmacy as: furosemide (LASIX) 20 MG tablet   E-Prescribing Status: Receipt confirmed by pharmacy (03/04/2018 3:35 PM EDT)   Pharmacy   [facility], [place]

## 2018-03-25 ENCOUNTER — Ambulatory Visit: Payer: Self-pay | Admitting: Physician Assistant

## 2018-03-25 ENCOUNTER — Encounter: Payer: Self-pay | Admitting: Physician Assistant

## 2018-03-25 VITALS — BP 130/86 | HR 87 | Temp 98.2°F | Ht 62.0 in | Wt 143.2 lb

## 2018-03-25 DIAGNOSIS — Z23 Encounter for immunization: Secondary | ICD-10-CM

## 2018-03-25 DIAGNOSIS — R300 Dysuria: Secondary | ICD-10-CM

## 2018-03-25 DIAGNOSIS — E119 Type 2 diabetes mellitus without complications: Secondary | ICD-10-CM

## 2018-03-25 LAB — URINE CULTURE, REFLEX

## 2018-03-25 MED ORDER — CEPHALEXIN 500 MG PO CAPS: 500.0000 mg | ORAL_CAPSULE | Freq: Two times a day (BID) | ORAL | 0 refills | Status: AC

## 2018-03-25 NOTE — Patient Instructions (Signed)

## 2018-03-25 NOTE — Progress Notes (Signed)
Subjective:    Patient ID: Amy Hansen, female    DOB: [birth year], 62 y.o.   MRN: [id number]  Amy Hansen is a 62 y.o. female presenting on 03/25/2018 for Urinary Tract Infection (pt states she has been having burning with urination, odor, and cloudy urine for the past few days )   HPI   Dysuria x 3 days. Some stinging. Some nausea, three episodes of vomiting. Currently coming off Xanax. Has some mild back pain. Had positive urine culture April of this year, also was seen at urgent care two times since for UTI. Treated with Keflex and was effective. Had been followed by urology for interstitial cystitis, has appointment in September.   She has Type II DM. Would like flu shot today.   Social History   Tobacco Use  . Smoking status: Former Smoker    Packs/day: 0.50    Types: Cigarettes    Last attempt to quit: 04/25/1975    Years since quitting: 42.9  . Smokeless tobacco: Never Used  . Tobacco comment: quit 40 years  Substance Use Topics  . Alcohol use: No    Alcohol/week: 0.0 standard drinks  . Drug use: No    Review of Systems Per HPI unless specifically indicated above     Objective:    BP 130/86 (BP Location: Left Arm, Cuff Size: Normal)   Pulse 87   Temp 98.2 F (36.8 C) (Oral)   Ht 5' 2"  (1.575 m)   Wt 143 lb 3.2 oz (65 kg)   LMP  (LMP Unknown)   SpO2 98%   BMI 26.19 kg/m   Wt Readings from Last 3 Encounters:  03/25/18 143 lb 3.2 oz (65 kg)  11/04/17 152 lb 4.8 oz (69.1 kg)  10/28/17 150 lb 9.6 oz (68.3 kg)    Physical Exam  Constitutional: She is oriented to person, place, and time. She appears well-developed and well-nourished. No distress.  Cardiovascular: Normal rate and regular rhythm.  Pulmonary/Chest: Effort normal and breath sounds normal.  Abdominal: Soft. Bowel sounds are normal. She exhibits no distension. There is tenderness in the suprapubic area. There is no rebound, no guarding and no CVA tenderness.  Neurological: She is alert and  oriented to person, place, and time.  Skin: Skin is warm and dry. She is not diaphoretic.  Psychiatric: She has a normal mood and affect. Her behavior is normal.   Results for orders placed or performed in visit on 03/25/18  Microscopic Examination  Result Value Ref Range   WBC, UA 6-10 (A) 0 - 5 /hpf   RBC, UA 3-10 (A) 0 - 2 /hpf   Epithelial Cells (non renal) 0-10 0 - 10 /hpf   Bacteria, UA Many (A) None seen/Few  Urine Culture, Reflex  Result Value Ref Range   Urine Culture, Routine WILL FOLLOW   UA/M w/rflx Culture, Routine  Result Value Ref Range   Specific Gravity, UA 1.010 1.005 - 1.030   pH, UA 6.5 5.0 - 7.5   Color, UA Yellow Yellow   Appearance Ur Clear Clear   Leukocytes, UA Trace Negative   Protein, UA Negative Negative/Trace   Glucose, UA 3+ (A) Negative   Ketones, UA Negative Negative   RBC, UA 1+ (A) Negative   Bilirubin, UA Negative Negative   Urobilinogen, Ur 0.2 0.2 - 1.0 mg/dL   Nitrite, UA Positive (A) Negative   Microscopic Examination See below:    Urinalysis Reflex Comment       Assessment &  Plan:  1. Burning with urination  Recurrent UTI in diabetic. There is glucose in her urine. She is not checking her fasting sugars. She has not had a1c checked in 10 months, suggest she check her fasting sugars as her medications may need adjustments, likely contributing to UTI recurrence . Will need follow up for that.   - UA/M w/rflx Culture, Routine - cephALEXin (KEFLEX) 500 MG capsule; Take 1 capsule (500 mg total) by mouth 2 (two) times daily for 7 days.  Dispense: 14 capsule; Refill: 0  2. Need for immunization against influenza  - Flu Vaccine QUAD 36+ mos IM  3. Type II DM Follow up plan: Return if symptoms worsen or fail to improve.  Carles Collet, PA-C [facility] 03/25/2018, 4:59 PM

## 2018-03-26 NOTE — Addendum Note (Signed)
Addended by: Trinna Post on: 03/26/2018 08:33 AM   Modules accepted: Orders

## 2018-03-27 LAB — MICROSCOPIC EXAMINATION

## 2018-03-27 LAB — UA/M W/RFLX CULTURE, ROUTINE
Bilirubin, UA: NEGATIVE
Ketones, UA: NEGATIVE
Nitrite, UA: POSITIVE — AB
Protein, UA: NEGATIVE
Specific Gravity, UA: 1.01 (ref 1.005–1.030)
Urobilinogen, Ur: 0.2 mg/dL (ref 0.2–1.0)
pH, UA: 6.5 (ref 5.0–7.5)

## 2018-03-28 ENCOUNTER — Telehealth: Payer: Self-pay | Admitting: Unknown Physician Specialty

## 2018-03-28 MED ORDER — FLUCONAZOLE 150 MG PO TABS
150.0000 mg | ORAL_TABLET | Freq: Every day | ORAL | 0 refills | Status: DC
Start: 2018-03-28 — End: 2018-04-07

## 2018-03-28 NOTE — Telephone Encounter (Signed)
Rx sent to her pharmacy 

## 2018-03-28 NOTE — Telephone Encounter (Signed)
Patient notified via voicemail.

## 2018-03-28 NOTE — Telephone Encounter (Signed)
Copied from [place] [id number]. Topic: Quick Communication - See Telephone Encounter >> Mar 28, 2018  4:18 PM Neva Seat wrote: Pt was put on antibiotic and now has a yeast infection.  Pt is asking for the difulcan pill.  Pt needing this today before weekend comes.   [facility]

## 2018-03-29 LAB — CULTURE, URINE COMPREHENSIVE

## 2018-04-01 ENCOUNTER — Other Ambulatory Visit: Payer: Self-pay | Admitting: Unknown Physician Specialty

## 2018-04-01 DIAGNOSIS — R0789 Other chest pain: Secondary | ICD-10-CM

## 2018-04-02 NOTE — Telephone Encounter (Signed)
Neurontin refill Last Refill:11/04/17 # 90 with 3 refills Last OV: 03/25/18 PCP: [facility] Pharmacy:[facility] [address]

## 2018-04-07 ENCOUNTER — Ambulatory Visit (INDEPENDENT_AMBULATORY_CARE_PROVIDER_SITE_OTHER): Payer: Self-pay | Admitting: Family Medicine

## 2018-04-07 ENCOUNTER — Ambulatory Visit (INDEPENDENT_AMBULATORY_CARE_PROVIDER_SITE_OTHER): Payer: Self-pay | Admitting: Urology

## 2018-04-07 ENCOUNTER — Ambulatory Visit
Admission: RE | Admit: 2018-04-07 | Discharge: 2018-04-07 | Disposition: A | Discharge status: Home / Self Care | Payer: Self-pay | Source: Ambulatory Visit | Attending: Family Medicine | Admitting: Family Medicine

## 2018-04-07 ENCOUNTER — Encounter: Payer: Self-pay | Admitting: Urology

## 2018-04-07 VITALS — BP 116/80 | HR 83 | Ht 62.0 in | Wt 141.0 lb

## 2018-04-07 VITALS — BP 117/77 | HR 109 | Temp 98.0°F | Ht 62.0 in | Wt 143.2 lb

## 2018-04-07 DIAGNOSIS — Z87440 Personal history of urinary (tract) infections: Secondary | ICD-10-CM

## 2018-04-07 DIAGNOSIS — M25552 Pain in left hip: Secondary | ICD-10-CM | POA: Insufficient documentation

## 2018-04-07 DIAGNOSIS — Z87448 Personal history of other diseases of urinary system: Secondary | ICD-10-CM

## 2018-04-07 DIAGNOSIS — M79605 Pain in left leg: Secondary | ICD-10-CM

## 2018-04-07 LAB — BLADDER SCAN AMB NON-IMAGING: Scan Result: 0

## 2018-04-07 IMAGING — DX DG HIP (WITH OR WITHOUT PELVIS) 2-3V*L*
3 series · 3 of 3 positions shown · non-contrast
Comparison: Coronal and sagittal reconstructed images through the
pelvis and hips from an abdominal and pelvic CT scan [DATE].

CLINICAL DATA: The patient has sustained a significant fall 2 weeks
ago and has had persistent left anterior hip and groin pain. No
previous injury.

EXAM:
DG HIP (WITH OR WITHOUT PELVIS) 2-3V LEFT

[pelvis ap]
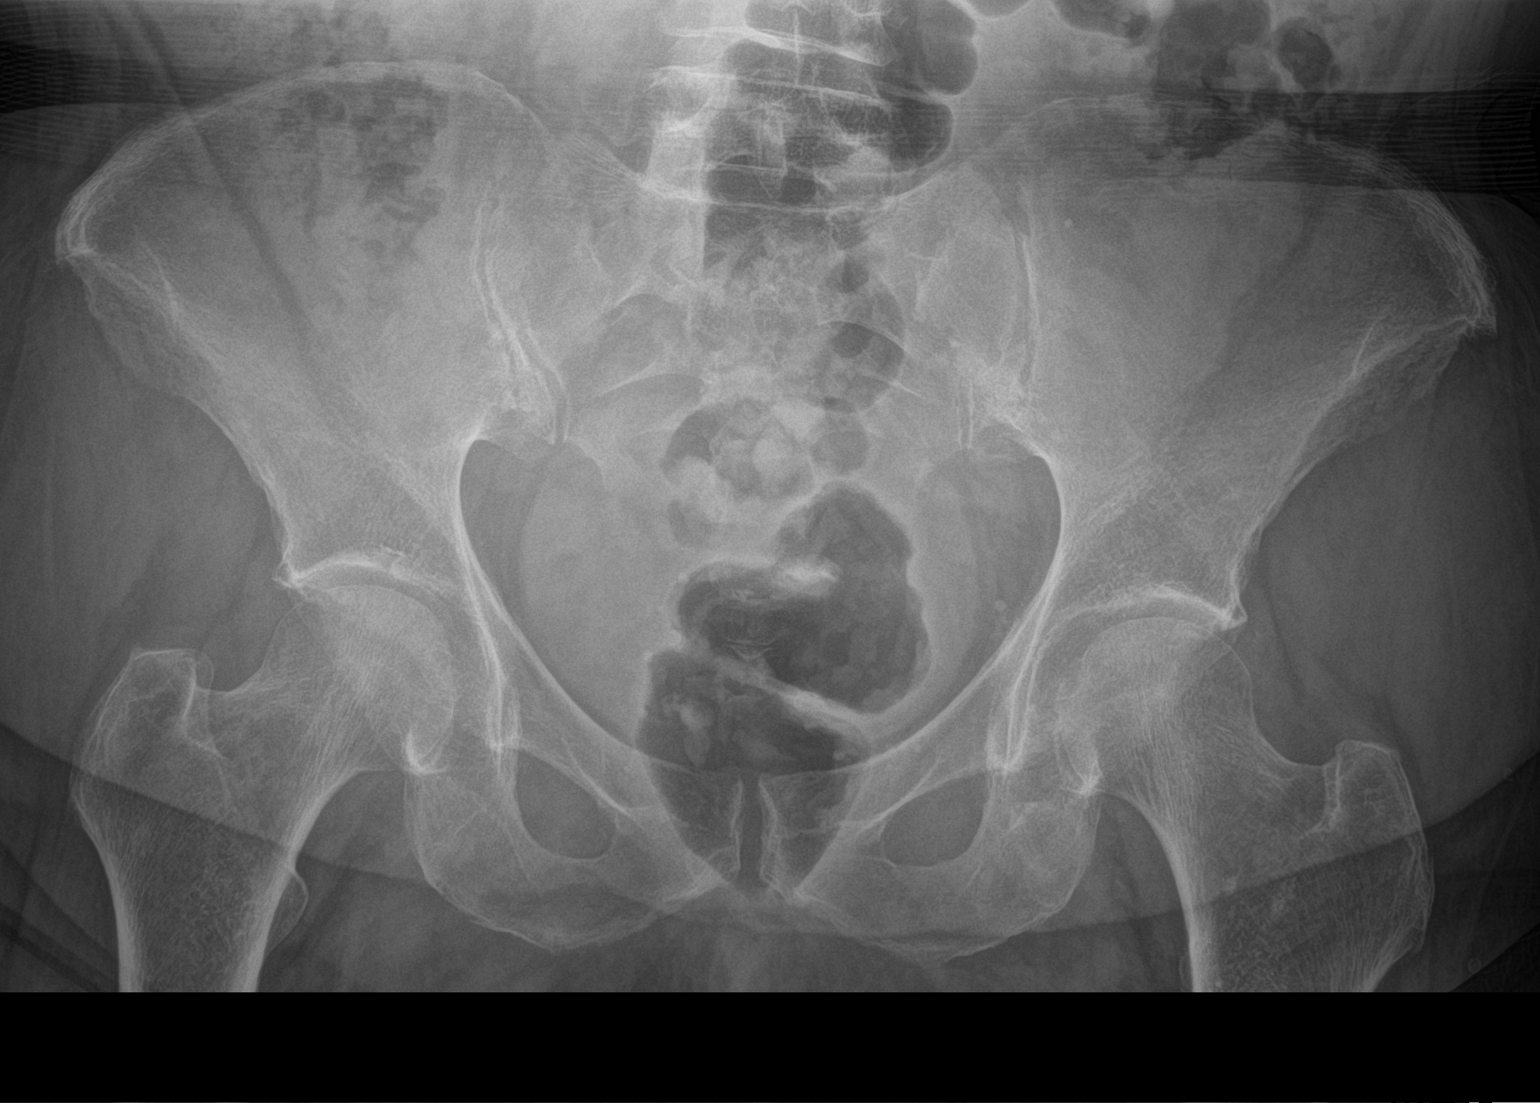

[hip ap]
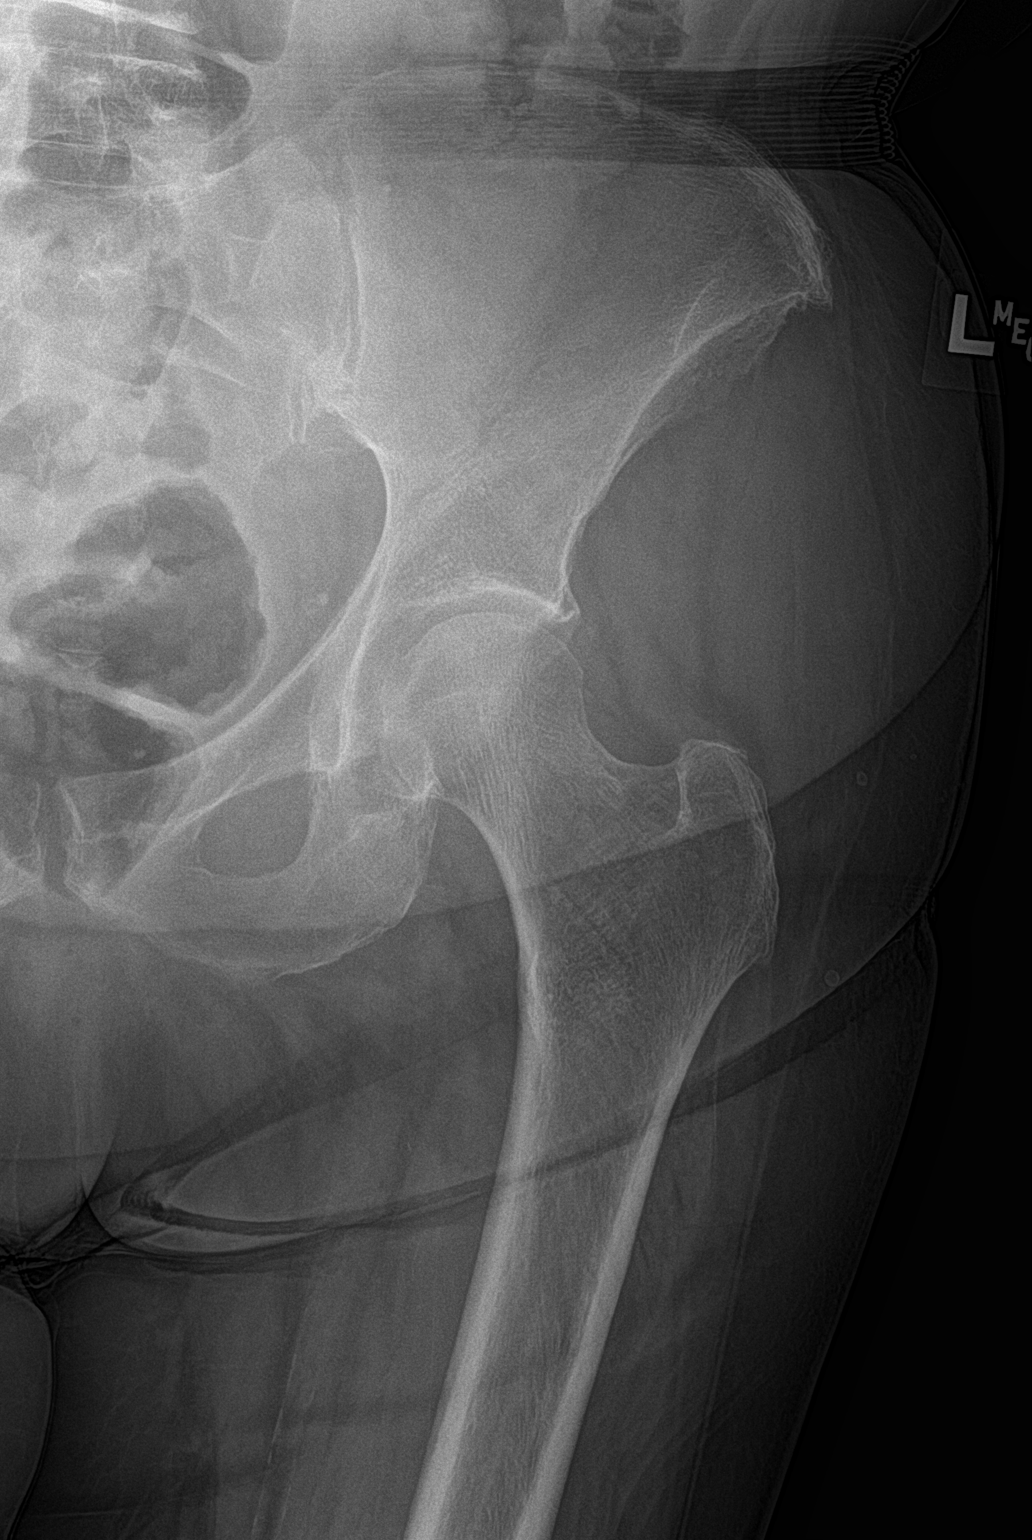

[hip lat]
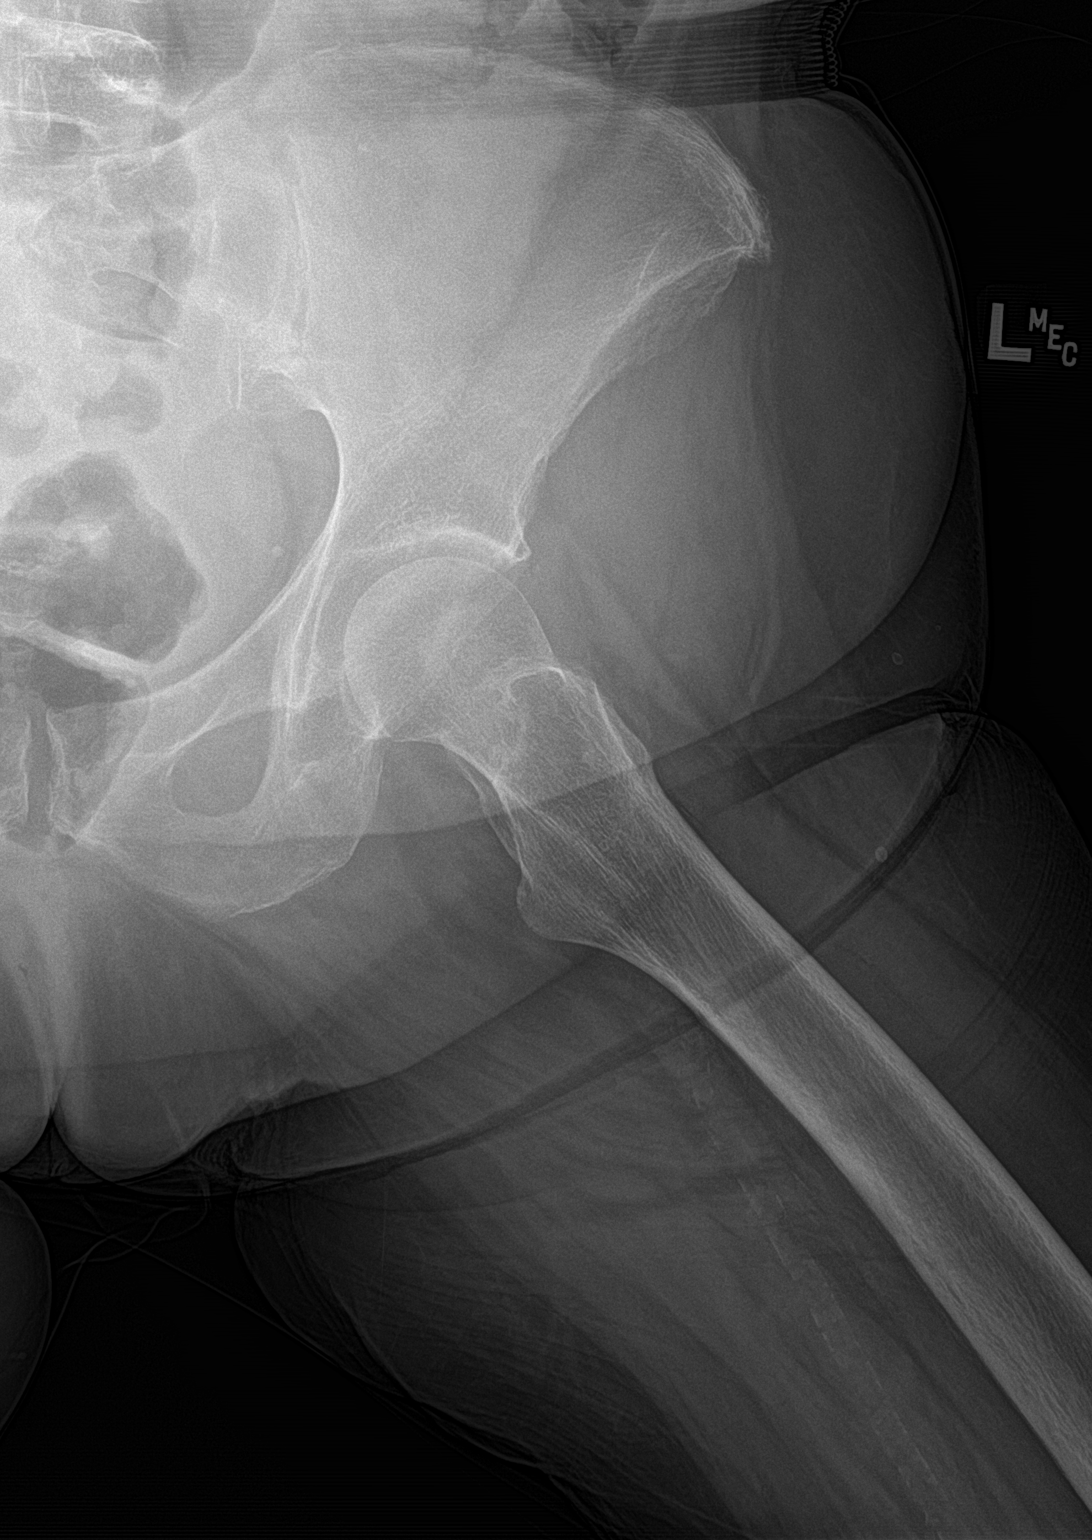

[3 of 3 positions shown; findings below may reference images not displayed]

FINDINGS: The bony pelvis is subjectively adequately mineralized. There is no
acute or healing fracture. AP and lateral views of the left hip
reveal preservation of the joint space. The articular surfaces of
the left femoral head and acetabulum remains smoothly rounded. The
femoral neck, intertrochanteric, and subtrochanteric regions are
normal.
IMPRESSION: There is no acute or significant chronic bony abnormality of the
left hip.

## 2018-04-07 MED ORDER — CYCLOBENZAPRINE HCL 10 MG PO TABS: 10.0000 mg | ORAL_TABLET | Freq: Three times a day (TID) | ORAL | 0 refills | Status: DC | PRN

## 2018-04-07 MED ORDER — ESTRADIOL 0.1 MG/GM VA CREA: TOPICAL_CREAM | VAGINAL | 12 refills | Status: DC

## 2018-04-07 MED ORDER — GABAPENTIN 300 MG PO CAPS: 300.0000 mg | ORAL_CAPSULE | Freq: Three times a day (TID) | ORAL | 1 refills | Status: DC | PRN

## 2018-04-07 MED ORDER — ESTROGENS, CONJUGATED 0.625 MG/GM VA CREA: TOPICAL_CREAM | VAGINAL | 12 refills | Status: DC

## 2018-04-07 NOTE — Progress Notes (Signed)
BP 117/77 (BP Location: Left Arm, Patient Position: Sitting, Cuff Size: Normal)   Pulse (!) 109   Temp 98 F (36.7 C) (Tympanic)   Ht 5' 2"  (1.575 m)   Wt 143 lb 3.2 oz (65 kg)   LMP  (LMP Unknown)   SpO2 96%   BMI 26.19 kg/m    Subjective:    Patient ID: [name], female    DOB: 1955-08-21, 62 y.o.   MRN: [id number]  HPI: SCARLETT PORTLOCK is a 62 y.o. female  Chief Complaint  Patient presents with  . Fall    Fell 2 weeks ago off side of porch. Patient states it's only getting worse. Severe pain when she walks.  . Hip Pain   [place] off the front porch into the yard onto the left side of her body 2 weeks ago. Taking naproxen with temporary relief. Sharp, deep pain in groin that's worse with weight bearing. Still has full ROM in b/l hips. Denies other areas of injury from fall other than two small abrasions to left side of body.   Relevant past medical, surgical, family and social history reviewed and updated as indicated. Interim medical history since our last visit reviewed. Allergies and medications reviewed and updated.  Review of Systems  Per HPI unless specifically indicated above     Objective:    BP 117/77 (BP Location: Left Arm, Patient Position: Sitting, Cuff Size: Normal)   Pulse (!) 109   Temp 98 F (36.7 C) (Tympanic)   Ht 5' 2"  (1.575 m)   Wt 143 lb 3.2 oz (65 kg)   LMP  (LMP Unknown)   SpO2 96%   BMI 26.19 kg/m   Wt Readings from Last 3 Encounters:  04/07/18 143 lb 3.2 oz (65 kg)  04/07/18 141 lb (64 kg)  03/25/18 143 lb 3.2 oz (65 kg)    Physical Exam  Constitutional: She is oriented to person, place, and time. She appears well-developed and well-nourished. No distress.  HENT:  Head: Atraumatic.  Eyes: Conjunctivae and EOM are normal.  Neck: Normal range of motion. Neck supple.  Cardiovascular: Normal rate, regular rhythm and intact distal pulses.  Pulmonary/Chest: Effort normal and breath sounds normal.  Musculoskeletal: She exhibits no  edema or deformity.  Antalgic gait Good ROM in b/l hips, though exam limited somewhat d/t patient discomfort  Neurological: She is alert and oriented to person, place, and time.  Skin: Skin is warm and dry. No erythema.  Psychiatric: She has a normal mood and affect. Her behavior is normal.  Vitals reviewed.   Results for orders placed or performed in visit on 04/07/18  CULTURE, URINE COMPREHENSIVE  Result Value Ref Range   Urine Culture, Comprehensive Preliminary report    Organism ID, Bacteria Comment   Microscopic Examination  Result Value Ref Range   WBC, UA 6-10 (A) 0 - 5 /hpf   RBC, UA 0-2 0 - 2 /hpf   Epithelial Cells (non renal) 0-10 0 - 10 /hpf   Mucus, UA Present (A) Not Estab.   Bacteria, UA Moderate (A) None seen/Few  Urinalysis, Complete  Result Value Ref Range   Specific Gravity, UA 1.010 1.005 - 1.030   pH, UA 5.5 5.0 - 7.5   Color, UA Yellow Yellow   Appearance Ur Clear Clear   Leukocytes, UA Trace (A) Negative   Protein, UA Negative Negative/Trace   Glucose, UA 2+ (A) Negative   Ketones, UA Negative Negative   RBC, UA Negative Negative  Bilirubin, UA Negative Negative   Urobilinogen, Ur 0.2 0.2 - 1.0 mg/dL   Nitrite, UA Negative Negative   Microscopic Examination See below:   Bladder Scan (Post Void Residual) in office  Result Value Ref Range   Scan Result 0       Assessment & Plan:   Problem List Items Addressed This Visit      Other   Left leg pain    Increase gabapentin to up to TID prn. Flexeril and naproxen prn.       Relevant Medications   gabapentin (NEURONTIN) 300 MG capsule    Other Visit Diagnoses    Left hip pain    -  Primary   Await x-ray report. Continue naproxen prn as well as flexeril and gabapentin.    Relevant Orders   DG HIP UNILAT WITH PELVIS 2-3 VIEWS LEFT (Completed)       Follow up plan: Return if symptoms worsen or fail to improve.

## 2018-04-07 NOTE — Progress Notes (Signed)
11:11 AM   SURAYA VIDRINE 1956-01-02 [id number]  Referring provider: Kathrine Haddock, NP [address], [place] [address]  Chief Complaint  Patient presents with  . Recurrent UTI    HPI: Patient is a 62 year old Caucasian female with a history of hematuria and IC who presents today for a one year follow up.  rUTI's She states that she had foul smelling urine, dysuria and frequency with her infections.  She is not using any vaginal estrogen cream.   Patient denies any gross hematuria, dysuria or suprapubic/flank pain.  Patient denies any fevers, chills, nausea or vomiting.   She has had three UTI's back to back over the summer.  She is drinking diet colas, six packs daily.  Her UA today is positive for moderate bacteria.  She feels that she may be starting another urinary tract infection at this time.    History of hematuria She was seen in [facility]  ED on 05/22/2015 for gross hematuria and had a non-contrast CT that demonstrated no renal, ureteral or bladder calculi.  Her urine culture at that time was negative for infection.  She was still having significant bladder symptoms, so she underwent cystoscopy with bilateral retrogrades on  06/06/2015 and no GU pathology was identified.   UA is negative for hematuria.    History of IC Her PVR today is 0 mL.    PMH: Past Medical History:  Diagnosis Date  . Anemia   . Anxiety   . Chronic kidney disease    UTI, hematuria in urine  . Depression   . Diabetes ([place])   . Diverticulosis   . Frequent headaches   . Interstitial cystitis   . Recurrent UTI   . Restless leg syndrome   . Urinary frequency     Surgical History: Past Surgical History:  Procedure Laterality Date  . ABDOMINAL HYSTERECTOMY    . bariatric bypass  2012  . CARPAL TUNNEL RELEASE Right 2003  . CARPAL TUNNEL RELEASE Right    2008  . CHOLECYSTECTOMY  1975  . CYSTOSCOPY W/ RETROGRADES Bilateral 06/06/2015   Procedure: CYSTOSCOPY WITH RETROGRADE PYELOGRAM;  Surgeon:  Festus Aloe, MD;  Location: [facility] ORS;  Service: Urology;  Laterality: Bilateral;  . FL INJ LEFT KNEE CT ARTHROGRAM ([facility] HX) Left    1995  . GASTRIC BYPASS  2010  . [place]  2013  . KNEE ARTHROSCOPY Left 1996  . TONSILLECTOMY      Home Medications:  Allergies as of 04/07/2018      Reactions   Avelox [moxifloxacin Hcl In Nacl] Anaphylaxis   Imitrex [sumatriptan] Other (See Comments)   Neck and shoulder pain   Stadol [butorphanol] Rash      Medication List        Accurate as of 04/07/18 11:11 AM. Always use your most recent med list.          ALPRAZolam 0.5 MG tablet Commonly known as:  XANAX Take 0.5 mg by mouth at bedtime as needed for anxiety.   buPROPion 150 MG 24 hr tablet Commonly known as:  WELLBUTRIN XL Take 150 mg by mouth daily.   butalbital-aspirin-caffeine 50-325-40 MG capsule Commonly known as:  FIORINAL TAKE 1 CAPSULE BY MOUTH TWICE DAILY AS NEEDED FOR HEADACHE   BYDUREON 2 MG Pen Generic drug:  Exenatide ER INJECT SUBCUTANEOUSLY 2MG  ONCE A WEEK   carbamazepine 200 MG tablet Commonly known as:  TEGRETOL TAKE 1 2 (ONE HALF) TABLET BY MOUTH THREE TIMES DAILY  conjugated estrogens vaginal cream Commonly known as:  PREMARIN Apply 0.61m (pea-sized amount)  just inside the vaginal introitus with a finger-tip on  Monday, Wednesday and Friday nights.   cyclobenzaprine 10 MG tablet Commonly known as:  FLEXERIL Take 1 tablet (10 mg total) by mouth 3 (three) times daily as needed for muscle spasms.   estradiol 0.1 MG/GM vaginal cream Commonly known as:  ESTRACE Apply 0.570m(pea-sized amount)  just inside the vaginal introitus with a finger-tip on Monday, Wednesday and Friday nights.   fluconazole 150 MG tablet Commonly known as:  DIFLUCAN Take 1 tablet (150 mg total) by mouth daily.   fluticasone 50 MCG/ACT nasal spray Commonly known as:  FLONASE Place 2 sprays into the nose as needed.   furosemide 20 MG tablet Commonly known as:   LASIX Take 1 tablet (20 mg total) by mouth every other day.   gabapentin 300 MG capsule Commonly known as:  NEURONTIN TAKE 1 CAPSULE BY MOUTH AT BEDTIME   lamoTRIgine 200 MG tablet Commonly known as:  LAMICTAL Take 150 mg by mouth. Take 2 tablets daily at bedtime   metFORMIN 1000 MG tablet Commonly known as:  GLUCOPHAGE TAKE 1 TABLET BY MOUTH TWO  TIMES DAILY WITH MEALS   naproxen 500 MG EC tablet Commonly known as:  EC NAPROSYN Take 1 tablet (500 mg total) 2 (two) times daily with a meal by mouth.   nitrofurantoin 50 MG capsule Commonly known as:  MACRODANTIN Take 1 capsule (50 mg total) by mouth 4 (four) times daily.   omeprazole 20 MG tablet Commonly known as:  PRILOSEC OTC Take 1 tablet (20 mg total) by mouth daily.   ondansetron 4 MG disintegrating tablet Commonly known as:  ZOFRAN-ODT Take 1 tablet (4 mg total) by mouth every 8 (eight) hours as needed.   ONE TOUCH ULTRA TEST test strip Generic drug:  glucose blood daily.   pramipexole 0.5 MG tablet Commonly known as:  MIRAPEX Take 1 tablet (0.5 mg total) by mouth at bedtime.   risperiDONE 1 MG tablet Commonly known as:  RISPERDAL Take 1 mg by mouth at bedtime.   sertraline 100 MG tablet Commonly known as:  ZOLOFT Take 100 mg by mouth daily.   URETRON D/S Tabs Take 1 tablet by mouth every 6 (six) hours as needed.   zolpidem 10 MG tablet Commonly known as:  AMBIEN Take 10 mg by mouth at bedtime.       Allergies:  Allergies  Allergen Reactions  . Avelox [Moxifloxacin Hcl In Nacl] Anaphylaxis  . Imitrex [Sumatriptan] Other (See Comments)    Neck and shoulder pain  . Stadol [Butorphanol] Rash    Family History: Family History  Problem Relation Age of Onset  . Stroke Father   . Heart failure Sister   . Bladder Cancer Neg Hx   . Kidney disease Neg Hx   . Prostate cancer Neg Hx   . Kidney cancer Neg Hx     Social History:  reports that she quit smoking about 42 years ago. Her smoking use  included cigarettes. She smoked 0.50 packs per day. She has never used smokeless tobacco. She reports that she does not drink alcohol or use drugs.  ROS: UROLOGY Frequent Urination?: No Hard to postpone urination?: No Burning/pain with urination?: No Get up at night to urinate?: No Leakage of urine?: No Urine stream starts and stops?: No Trouble starting stream?: No Do you have to strain to urinate?: No Blood in urine?: No Urinary tract  infection?: Yes Sexually transmitted disease?: No Injury to kidneys or bladder?: No Painful intercourse?: No Weak stream?: No Currently pregnant?: No Vaginal bleeding?: No Last menstrual period?: n  Gastrointestinal Nausea?: No Vomiting?: No Indigestion/heartburn?: No Diarrhea?: No Constipation?: No  Constitutional Fever: No Night sweats?: No Weight loss?: No Fatigue?: No  Skin Skin rash/lesions?: No Itching?: No  Eyes Blurred vision?: No Double vision?: No  Ears/Nose/Throat Sore throat?: No Sinus problems?: No  Hematologic/Lymphatic Swollen glands?: No Easy bruising?: No  Cardiovascular Leg swelling?: No Chest pain?: No  Respiratory Cough?: No Shortness of breath?: No  Endocrine Excessive thirst?: No  Musculoskeletal Back pain?: No Joint pain?: No  Neurological Headaches?: No Dizziness?: No  Psychologic Depression?: No Anxiety?: No  Physical Exam: BP 116/80   Pulse 83   Ht 5' 2"  (1.575 m)   Wt 141 lb (64 kg)   LMP  (LMP Unknown)   BMI 25.79 kg/m   Constitutional: Well nourished. Alert and oriented, No acute distress. HEENT: [place] AT, moist mucus membranes. Trachea midline, no masses. Cardiovascular: No clubbing, cyanosis, or edema. Respiratory: Normal respiratory effort, no increased work of breathing. GI: Abdomen is soft, non tender, non distended, no abdominal masses. Liver and spleen not palpable.  No hernias appreciated.  Stool sample for occult testing is not indicated.   GU: No CVA  tenderness.  No bladder fullness or masses.  Atrophy external genitalia, normal pubic hair distribution, no lesions.  Normal urethral meatus, no lesions, no prolapse, no discharge.   No urethral masses, tenderness and/or tenderness. No bladder fullness, tenderness or masses. Normal vagina mucosa, good estrogen effect, no discharge, no lesions, good pelvic support, grade I cystocele and no rectocele noted.  Cervix, uterus and adnexa are surgically absent.  Anus and perineum are without rashes or lesions.    Skin: No rashes, bruises or suspicious lesions. Lymph: No cervical or inguinal adenopathy. Neurologic: Grossly intact, no focal deficits, moving all 4 extremities. Psychiatric: Normal mood and affect.  Laboratory Data: Lab Results  Component Value Date   WBC 4.4 10/17/2016   HGB 10.7 (L) 10/17/2016   HCT 34.1 10/17/2016   MCV 75 (L) 10/17/2016   PLT 368 10/17/2016    Lab Results  Component Value Date   CREATININE 0.61 02/18/2017    Lab Results  Component Value Date   HGBA1C 7.3% 03/21/2016    Urinalysis Moderate bacteria.   See EPIC.   I have reviewed the labs  Pertinent imaging Results for SHAYE, ELLING (MRN [id number]) as of 04/07/2018 11:12  Ref. Range 04/07/2018 10:24  Scan Result Unknown 0    Assessment & Plan:    1. rUTI's UA is suspicious for infection, will send for culture but will not prescribe antibiotic until culture results are available Patient is instructed to discontinue drinking diet colas and increase water intake We will also restart her vaginal estrogen cream  2. History of hematuria Hematuria work up completed in 05/2015-  negative No report of gross hematuria  UA today negative for hematuria RTC in one year for UA - patient to report any gross hematuria in the interim    3. History of IC Currently no symptoms of IC   Return for pending urine culture results .  Zara Council, PA-C  [facility] [address]  [place], [place] [address] [phone]

## 2018-04-08 ENCOUNTER — Telehealth: Payer: Self-pay | Admitting: Family Medicine

## 2018-04-08 LAB — URINALYSIS, COMPLETE
Bilirubin, UA: NEGATIVE
Ketones, UA: NEGATIVE
NITRITE UA: NEGATIVE
PH UA: 5.5 (ref 5.0–7.5)
Protein, UA: NEGATIVE
RBC, UA: NEGATIVE
Specific Gravity, UA: 1.01 (ref 1.005–1.030)
Urobilinogen, Ur: 0.2 mg/dL (ref 0.2–1.0)

## 2018-04-08 LAB — MICROSCOPIC EXAMINATION

## 2018-04-08 NOTE — Telephone Encounter (Signed)
Result was released this morning via Mychart. Normal x-ray, no fracture  Copied from [place] [id number]. Topic: General - Other >> Apr 08, 2018  9:57 AM Valla Leaver wrote: Reason for CRM: Patient calling for x-ray results.

## 2018-04-08 NOTE — Telephone Encounter (Signed)
LVM for patient (DPR reviewed) of result.  Explained if patient had any questions or concerns to call us back.

## 2018-04-08 NOTE — Telephone Encounter (Signed)
Patient would like to know what she is suppose to do for pain since xray results were normal. Patient states she is in a lot of pain. Please advise. CB# [phone]

## 2018-04-09 NOTE — Assessment & Plan Note (Signed)
Increase gabapentin to up to TID prn. Flexeril and naproxen prn.

## 2018-04-09 NOTE — Telephone Encounter (Signed)
Naproxen, tylenol, and I could send some prednisone to help with the inflammation. She's on a few other controlled substances so I don't feel comfortable with any controlled pain medications but another option we have is going to orthopedics for hip injection

## 2018-04-09 NOTE — Telephone Encounter (Addendum)
Left message on machine for pt to return call to the office. CRM updated.  

## 2018-04-09 NOTE — Telephone Encounter (Signed)
Routing to provider to advise.  

## 2018-04-09 NOTE — Patient Instructions (Signed)
Follow up as needed

## 2018-04-11 ENCOUNTER — Telehealth: Payer: Self-pay

## 2018-04-11 LAB — CULTURE, URINE COMPREHENSIVE

## 2018-04-11 MED ORDER — CEPHALEXIN 500 MG PO CAPS: 500.0000 mg | ORAL_CAPSULE | Freq: Two times a day (BID) | ORAL | 0 refills | Status: DC

## 2018-04-11 NOTE — Telephone Encounter (Signed)
Patient notified, she states that Cipro cross reacts with her allergy to Avelox with is anaphylaxis. Per Larene Beach ok to switch to Keflex 536m BID. [facility]

## 2018-04-11 NOTE — Telephone Encounter (Signed)
-----   Message from Nori Riis, PA-C sent at 04/11/2018 10:04 AM EDT ----- Please let Mrs. Berges know that her urine culture was positive for infection.  Please send in a script for Cipro 500 mg, bid for seven days to her chosen pharmacy.

## 2018-04-15 NOTE — Telephone Encounter (Signed)
Spoke with patient to notify her. Denied referral to ortho and pred. She stated that she hit her head and she's been having speech issues, staggering, and disorientation. Patient stated she thinks it could be the gabapentin and flexeril.   Did not want to go to UC. Scheduled patient 04/18/2018 with Malachy Mood. I explained to patient if her symptoms worsened even the smallest to seek emergency care immediately.   Routing to provider to [facility].

## 2018-04-15 NOTE — Telephone Encounter (Signed)
I recommend she go to the ER for eval if those are her sxs

## 2018-04-16 ENCOUNTER — Ambulatory Visit: Payer: Managed Care, Other (non HMO) | Admitting: Urology

## 2018-04-18 ENCOUNTER — Ambulatory Visit (INDEPENDENT_AMBULATORY_CARE_PROVIDER_SITE_OTHER): Payer: Self-pay | Admitting: Unknown Physician Specialty

## 2018-04-18 ENCOUNTER — Encounter: Payer: Self-pay | Admitting: Unknown Physician Specialty

## 2018-04-18 VITALS — BP 124/82 | HR 79 | Temp 98.4°F | Ht 62.0 in | Wt 146.8 lb

## 2018-04-18 DIAGNOSIS — I10 Essential (primary) hypertension: Secondary | ICD-10-CM

## 2018-04-18 DIAGNOSIS — E119 Type 2 diabetes mellitus without complications: Secondary | ICD-10-CM

## 2018-04-18 DIAGNOSIS — E1165 Type 2 diabetes mellitus with hyperglycemia: Secondary | ICD-10-CM

## 2018-04-18 DIAGNOSIS — R42 Dizziness and giddiness: Secondary | ICD-10-CM

## 2018-04-18 LAB — BAYER DCA HB A1C WAIVED: HB A1C: 12.6 % — AB (ref <7.0)

## 2018-04-18 NOTE — Patient Instructions (Signed)
Base your long acting insulin on your fasting (usually in the morning) blood sugar.  Increase long acting (daily insulin) 2 units if fasting blood sugar is greater than 120.  Decrease by 2 units if fasting blood sugar is less than 95.

## 2018-04-18 NOTE — Assessment & Plan Note (Signed)
>>  ASSESSMENT AND PLAN FOR DIABETES MELLITUS TYPE 2 IN NONOBESE (HCC) WRITTEN ON 04/18/2018 10:34 AM BY Jamesetta Orleans, CHERYL, NP  Poor control.  Restart Bydureon and try to get pt assistance.  Samples given.  Start insulin with the following instructions: Base your long acting insulin on your fasting (usually in the morning) blood sugar.  Increase long acting (daily insulin) 2 units if fasting blood sugar is greater than 120.  Decrease by 2 units if fasting blood sugar is less than 95.  Toujeo given

## 2018-04-18 NOTE — Assessment & Plan Note (Signed)
Poor control.  Restart Bydureon and try to get pt assistance.  Samples given.  Start insulin with the following instructions: Base your long acting insulin on your fasting (usually in the morning) blood sugar.  Increase long acting (daily insulin) 2 units if fasting blood sugar is greater than 120.  Decrease by 2 units if fasting blood sugar is less than 95.  Toujeo given

## 2018-04-18 NOTE — Progress Notes (Signed)
BP 124/82   Pulse 79   Temp 98.4 F (36.9 C) (Oral)   Ht 5' 2"  (1.575 m)   Wt 146 lb 12.8 oz (66.6 kg)   LMP  (LMP Unknown)   SpO2 97%   BMI 26.85 kg/m    Subjective:    Patient ID: Amy Hansen, female    DOB: [birth year], 62 y.o.   MRN: [id number]  HPI: Amy Hansen is a 62 y.o. female  Chief Complaint  Patient presents with  . Medication Problem    pt states that she feels like the medications flexeril and gabapentin have been causing her to stagger and have speech problems   . Diabetes    pt states she has not had a recent eye exam    Pt fell a few weeks ago following a fall.  She was put on Gabapentin and Flexeril.  Since then she has had trouble with memory, confusion, agitation, and dizzyness.  She is also on Zoloft and Wellbutrin.    Diabetes: Lost insurance and not taking Bydureon Using medications without difficulties No hypoglycemic episodes No hyperglycemic episodes Feet problems: Blood Sugars averaging: eye exam within last year Last Hgb A1C:  Hypertension  Using medications without difficulty Average home BPs   Using medication without problems or lightheadedness No chest pain with exertion or shortness of breath No Edema  Elevated Cholesterol Statin intolerant.    Relevant past medical, surgical, family and social history reviewed and updated as indicated. Interim medical history since our last visit reviewed. Allergies and medications reviewed and updated.  Review of Systems  Constitutional: Positive for fatigue.  Psychiatric/Behavioral:       Reports a lot of stress at this time.      Per HPI unless specifically indicated above     Objective:    BP 124/82   Pulse 79   Temp 98.4 F (36.9 C) (Oral)   Ht 5' 2"  (1.575 m)   Wt 146 lb 12.8 oz (66.6 kg)   LMP  (LMP Unknown)   SpO2 97%   BMI 26.85 kg/m   Wt Readings from Last 3 Encounters:  04/18/18 146 lb 12.8 oz (66.6 kg)  04/07/18 143 lb 3.2 oz (65 kg)  04/07/18 141 lb (64 kg)      Physical Exam  Constitutional: She is oriented to person, place, and time. She appears well-developed and well-nourished.  HENT:  Head: Normocephalic and atraumatic.  Eyes: Pupils are equal, round, and reactive to light. EOM are normal. Right eye exhibits no discharge. Left eye exhibits no discharge. No scleral icterus.  Neck: Normal range of motion. Neck supple. Carotid bruit is not present. No thyromegaly present.  Cardiovascular: Normal rate, regular rhythm and normal heart sounds. Exam reveals no gallop and no friction rub.  No murmur heard. Pulmonary/Chest: Effort normal and breath sounds normal. No respiratory distress. She has no wheezes. She has no rales. No breast tenderness or discharge.  Abdominal: Soft. Bowel sounds are normal. There is no tenderness. There is no rebound.  Genitourinary: No breast tenderness or discharge.  Musculoskeletal: Normal range of motion.  Lymphadenopathy:    She has no cervical adenopathy.  Neurological: She is alert and oriented to person, place, and time. She displays normal reflexes. No cranial nerve deficit. She exhibits normal muscle tone. Coordination normal.  Skin: Skin is warm, dry and intact. No rash noted.  Psychiatric: She has a normal mood and affect. Her speech is normal and behavior is normal. Judgment and  thought content normal. Cognition and memory are normal.   Diabetic Foot Exam - Simple   Simple Foot Form Diabetic Foot exam was performed with the following findings:  Yes 04/18/2018 10:03 AM  Visual Inspection No deformities, no ulcerations, no other skin breakdown bilaterally:  Yes Sensation Testing See comments:  Yes Pulse Check Posterior Tibialis and Dorsalis pulse intact bilaterally:  Yes Comments Numbness to microfilament bilateral heels and left foot between first and third toe     Results for orders placed or performed in visit on 04/07/18  CULTURE, URINE COMPREHENSIVE  Result Value Ref Range   Urine Culture,  Comprehensive Final report (A)    Organism ID, Bacteria Comment (A)    ANTIMICROBIAL SUSCEPTIBILITY Comment   Microscopic Examination  Result Value Ref Range   WBC, UA 6-10 (A) 0 - 5 /hpf   RBC, UA 0-2 0 - 2 /hpf   Epithelial Cells (non renal) 0-10 0 - 10 /hpf   Mucus, UA Present (A) Not Estab.   Bacteria, UA Moderate (A) None seen/Few  Urinalysis, Complete  Result Value Ref Range   Specific Gravity, UA 1.010 1.005 - 1.030   pH, UA 5.5 5.0 - 7.5   Color, UA Yellow Yellow   Appearance Ur Clear Clear   Leukocytes, UA Trace (A) Negative   Protein, UA Negative Negative/Trace   Glucose, UA 2+ (A) Negative   Ketones, UA Negative Negative   RBC, UA Negative Negative   Bilirubin, UA Negative Negative   Urobilinogen, Ur 0.2 0.2 - 1.0 mg/dL   Nitrite, UA Negative Negative   Microscopic Examination See below:   Bladder Scan (Post Void Residual) in office  Result Value Ref Range   Scan Result 0       Assessment & Plan:   Problem List Items Addressed This Visit      Unprioritized   Hypertension    Stable, continue present medications.        Poorly controlled type 2 diabetes mellitus ([place]) - Primary    Poor control.  Restart Bydureon and try to get pt assistance.  Samples given.  Start insulin with the following instructions: Base your long acting insulin on your fasting (usually in the morning) blood sugar.  Increase long acting (daily insulin) 2 units if fasting blood sugar is greater than 120.  Decrease by 2 units if fasting blood sugar is less than 95.  Toujeo given       Other Visit Diagnoses    Dizzy       Largely resolved.  Due to Serotonin syndrome and Gabapentin.  Check CMP.        Refusing Lipid panel  Follow up plan: Return in about 2 weeks (around 05/02/2018).

## 2018-04-18 NOTE — Assessment & Plan Note (Signed)
Stable, continue present medications.   

## 2018-04-19 LAB — COMPREHENSIVE METABOLIC PANEL
ALT: 20 IU/L (ref 0–32)
AST: 12 IU/L (ref 0–40)
Albumin/Globulin Ratio: 1.5 (ref 1.2–2.2)
Albumin: 3.9 g/dL (ref 3.6–4.8)
Alkaline Phosphatase: 175 IU/L — ABNORMAL HIGH (ref 39–117)
BUN/Creatinine Ratio: 9 — ABNORMAL LOW (ref 12–28)
BUN: 7 mg/dL — AB (ref 8–27)
Bilirubin Total: <0.2 mg/dL (ref 0.0–1.2)
CALCIUM: 9.4 mg/dL (ref 8.7–10.3)
CO2: 24 mmol/L (ref 20–29)
Chloride: 94 mmol/L — ABNORMAL LOW (ref 96–106)
Creatinine, Ser: 0.78 mg/dL (ref 0.57–1.00)
GFR, EST AFRICAN AMERICAN: 96 mL/min/{1.73_m2} (ref >59)
GFR, EST NON AFRICAN AMERICAN: 83 mL/min/{1.73_m2} (ref >59)
GLUCOSE: 316 mg/dL — AB (ref 65–99)
Globulin, Total: 2.6 g/dL (ref 1.5–4.5)
Potassium: 4.3 mmol/L (ref 3.5–5.2)
Sodium: 134 mmol/L (ref 134–144)
TOTAL PROTEIN: 6.5 g/dL (ref 6.0–8.5)

## 2018-04-21 NOTE — Progress Notes (Signed)
Normal labs.  Pt notified through mychart

## 2018-04-23 ENCOUNTER — Telehealth: Payer: Self-pay | Admitting: Urology

## 2018-04-23 ENCOUNTER — Other Ambulatory Visit: Payer: Self-pay | Admitting: Urology

## 2018-04-23 MED ORDER — FLUCONAZOLE 150 MG PO TABS: 150.0000 mg | ORAL_TABLET | Freq: Once | ORAL | 0 refills | Status: AC

## 2018-04-23 NOTE — Telephone Encounter (Signed)
Patient called asking for diflucan due to a yeast infection she got after taking an ABX we gave her. Can we call it into [facility] on [address].    Sharyn Lull

## 2018-04-23 NOTE — Progress Notes (Unsigned)
Diflucan sent to pharmacy.

## 2018-04-24 NOTE — Telephone Encounter (Signed)
Called patient and let her know  Had to lm on [place]

## 2018-05-02 ENCOUNTER — Ambulatory Visit: Payer: Self-pay | Admitting: Unknown Physician Specialty

## 2018-05-05 ENCOUNTER — Telehealth: Payer: Self-pay | Admitting: Physician Assistant

## 2018-05-07 ENCOUNTER — Other Ambulatory Visit: Payer: Self-pay

## 2018-05-13 ENCOUNTER — Other Ambulatory Visit: Payer: Self-pay | Admitting: Family Medicine

## 2018-05-13 ENCOUNTER — Telehealth: Payer: Self-pay | Admitting: Family Medicine

## 2018-05-13 DIAGNOSIS — M79605 Pain in left leg: Secondary | ICD-10-CM

## 2018-05-13 NOTE — Telephone Encounter (Signed)
Pt.notified

## 2018-05-13 NOTE — Telephone Encounter (Signed)
She would need to be taking 6 pills a day to use that much- can we please find out how many a day she is taking so we can put the proper dose on the Rx? Thanks!

## 2018-05-13 NOTE — Telephone Encounter (Signed)
Requested Prescriptions  Pending Prescriptions Disp Refills  . gabapentin (NEURONTIN) 300 MG capsule [Pharmacy Med Name: GABAPENTIN 300MG    CAP] 90 capsule 1    Sig: TAKE 1 CAPSULE BY MOUTH THREE TIMES DAILY AS NEEDED     Neurology: Anticonvulsants - gabapentin Passed - 05/13/2018  2:22 PM      Passed - Valid encounter within last 12 months    Recent Outpatient Visits          3 weeks ago Controlled type 2 diabetes mellitus without complication, without long-term current use of insulin ([place])   [facility] Kathrine Haddock, NP   1 month ago Left hip pain   [facility] Merrie Roof [place], [place]   1 month ago Burning with urination   [place], [place], [place]   6 months ago Burning with urination   [place], NP   6 months ago Rib pain on left side   [facility] Kathrine Haddock, NP

## 2018-05-13 NOTE — Telephone Encounter (Signed)
[facility] calling and states pt filled the gabapentin on 04/07/18, then used her 1 refill on 04/22/18. Now calling in for another refill. They believe the pt may be taking the drug more than directed and would like to see how the provider would like to proceed.

## 2018-05-13 NOTE — Progress Notes (Signed)
Follow-up Outpatient Visit Date: 05/15/2018  Primary Care Provider: Volney American, PA-C [place] [id number]  Chief Complaint: Follow-up HFpEF and chest pain  HPI:  Amy Hansen is a 62 y.o. year-old female with history of HFpEF, DM, anemia, anxiety, depression, and restless leg syndrome, who presents for follow-up of HFpEF and atypical chest pain.  I last saw her in 02/2017 (previously followed by Dr. Yvone Neu).  At that time, Ms. Kube reported continued intermittent chest pain occurring about once a month.  She noted that she had been recently diagnosed with a hiatal hernia and was taking omeprazole for about 2 weeks.  Due to intermittent orthostatic lightheadedness, we agreed to decrease furosemide to 20 mg every other day, though Ms. Spargo reports that she has still been taking on a daily basis.  She is feeling well, denying chest pain, shortness of breath, palpitations, lightheadedness, and edema.  She remains on omeprazole, given history of GERD and hiatal hernia.  --------------------------------------------------------------------------------------------------  Cardiovascular History & Procedures: Cardiovascular Problems:  Chronic diastolic heart failure  Atypical chest pain  Risk Factors:  Diabetes mellitus  Cath/PCI:  None  CV Surgery:  None  EP Procedures and Devices:  None  Non-Invasive Evaluation(s):  Exercise MPI (08/30/16): Low risk study without significant ischemia or scar. LVEF 50%.  TTE (08/23/16): Normal LV size and wall thickness. LVEF 55-60% with normal wall motion. Grade 1 diastolic dysfunction. Mild MR. Mild left atrial enlargement. Normal RV size and function.  Recent CV Pertinent Labs: Lab Results  Component Value Date   CHOL 139 07/17/2016   CHOL 148 01/10/2012   HDL 47 01/10/2012   LDLCALC 79 01/10/2012   TRIG 99 07/17/2016   TRIG 110 01/10/2012   K 4.3 04/18/2018   K 4.1 01/10/2012   BUN 7 (L) 04/18/2018   BUN  4 (L) 01/10/2012   CREATININE 0.78 04/18/2018   CREATININE 0.58 (L) 01/10/2012    Past medical and surgical history were reviewed and updated in EPIC.  Current Meds  Medication Sig  . ALPRAZolam (XANAX) 0.5 MG tablet Take 0.5 mg by mouth at bedtime as needed for anxiety.  [name] buPROPion (WELLBUTRIN XL) 150 MG 24 hr tablet Take 150 mg by mouth daily.   . butalbital-aspirin-caffeine (FIORINAL) 50-325-40 MG capsule TAKE 1 CAPSULE BY MOUTH TWICE DAILY AS NEEDED FOR HEADACHE  . BYDUREON 2 MG PEN INJECT SUBCUTANEOUSLY 2MG  ONCE A WEEK  . cephALEXin (KEFLEX) 500 MG capsule Take 1 capsule (500 mg total) by mouth 2 (two) times daily.  . cyclobenzaprine (FLEXERIL) 10 MG tablet Take 1 tablet (10 mg total) by mouth 3 (three) times daily as needed for muscle spasms.  [name] estradiol (ESTRACE VAGINAL) 0.1 MG/GM vaginal cream Apply 0.50m (pea-sized amount)  just inside the vaginal introitus with a finger-tip on Monday, Wednesday and Friday nights.  . fluticasone (FLONASE) 50 MCG/ACT nasal spray Place 2 sprays into the nose as needed.   . furosemide (LASIX) 20 MG tablet Take 1 tablet (20 mg total) by mouth every other day.  . gabapentin (NEURONTIN) 300 MG capsule TAKE 1 CAPSULE BY MOUTH THREE TIMES DAILY AS NEEDED  . lamoTRIgine (LAMICTAL) 200 MG tablet Take 150 mg by mouth. Take 2 tablets daily at bedtime  . metFORMIN (GLUCOPHAGE) 1000 MG tablet TAKE 1 TABLET BY MOUTH TWO  TIMES DAILY WITH MEALS  . Meth-Hyo-M Bl-Na Phos-Ph Sal (URETRON D/S) TABS Take 1 tablet by mouth every 6 (six) hours as needed.  . naproxen (NAPROXEN  DR) 500 MG EC tablet Take 1 tablet (500 mg total) 2 (two) times daily with a meal by mouth.  [name] omeprazole (PRILOSEC OTC) 20 MG tablet Take 1 tablet (20 mg total) by mouth daily.  . ondansetron (ZOFRAN ODT) 4 MG disintegrating tablet Take 1 tablet (4 mg total) by mouth every 8 (eight) hours as needed.  . ONE TOUCH ULTRA TEST test strip daily.  . pramipexole (MIRAPEX) 0.5 MG tablet Take 1 tablet  (0.5 mg total) by mouth at bedtime.  . risperiDONE (RISPERDAL) 1 MG tablet Take 1 mg by mouth at bedtime.  . sertraline (ZOLOFT) 100 MG tablet Take 100 mg by mouth daily.  [name] zolpidem (AMBIEN) 10 MG tablet Take 10 mg by mouth at bedtime.    Allergies: Avelox [moxifloxacin hcl in nacl]; Imitrex [sumatriptan]; and Stadol [butorphanol]  Social History   Tobacco Use  . Smoking status: Former Smoker    Packs/day: 0.50    Types: Cigarettes    Last attempt to quit: 04/25/1975    Years since quitting: 43.0  . Smokeless tobacco: Never Used  . Tobacco comment: quit 40 years  Substance Use Topics  . Alcohol use: No    Alcohol/week: 0.0 standard drinks  . Drug use: No    Family History  Problem Relation Age of Onset  . Stroke Father   . Heart failure Sister   . Bladder Cancer Neg Hx   . Kidney disease Neg Hx   . Prostate cancer Neg Hx   . Kidney cancer Neg Hx     Review of Systems: A 12-system review of systems was performed and was negative except as noted in the HPI.  --------------------------------------------------------------------------------------------------  Physical Exam: BP 126/80 (BP Location: Left Arm, Patient Position: Sitting, Cuff Size: Normal)   Ht 5' 2"  (1.575 m)   Wt 146 lb 12 oz (66.6 kg)   LMP  (LMP Unknown)   BMI 26.84 kg/m   General: NAD. HEENT: No conjunctival pallor or scleral icterus. Moist mucous membranes.  OP clear. Neck: Supple without lymphadenopathy, thyromegaly, JVD, or HJR. Lungs: Normal work of breathing. Clear to auscultation bilaterally without wheezes or crackles. Heart: Regular rate and rhythm without murmurs, rubs, or gallops. Non-displaced PMI. Abd: Bowel sounds present. Soft, NT/ND without hepatosplenomegaly Ext: No lower extremity edema. Skin: Warm and dry without rash.  EKG: Patient declined.  Lab Results  Component Value Date   WBC 4.4 10/17/2016   HGB 10.7 (L) 10/17/2016   HCT 34.1 10/17/2016   MCV 75 (L) 10/17/2016    PLT 368 10/17/2016    Lab Results  Component Value Date   NA 134 04/18/2018   K 4.3 04/18/2018   CL 94 (L) 04/18/2018   CO2 24 04/18/2018   BUN 7 (L) 04/18/2018   CREATININE 0.78 04/18/2018   GLUCOSE 316 (H) 04/18/2018   ALT 20 04/18/2018    Lab Results  Component Value Date   CHOL 139 07/17/2016   HDL 47 01/10/2012   LDLCALC 79 01/10/2012   TRIG 99 07/17/2016    --------------------------------------------------------------------------------------------------  ASSESSMENT AND PLAN: HFpEF Patient appears euvolemic with NYHA class I-II heart failure symptoms.  Recent labs showed normal renal function and potassium.  Given that she is tolerating furosemide 20 mg daily well, we will plan to continue this.  Atypical chest pain Resolved with PPI therapy.  Given low risk stress test in the past, I favor her prior chest pain being due to GERD.  No further work-up or intervention at this time.  Follow-up: Return to clinic in 1 year.  Nelva Bush, MD 05/15/2018 3:07 PM

## 2018-05-13 NOTE — Telephone Encounter (Signed)
Spoke with pharmacy, they stated that the patient had 90 filled on 04/07/18 and two weeks later had the other 90 filled. They are concerned that she is taking more than prescribed.  I spoke with the patient, she states that she is taking 1 tablet three times a day  Please contact the pharmacy and discuss this with them.

## 2018-05-13 NOTE — Telephone Encounter (Signed)
Requested medication (s) are due for refill today -patient should have refill for this month  Requested medication (s) are on the active medication list -yes  Future visit scheduled -no  Notes to clinic: Patient was having dizziness and was supposed to follow up early this month. Rx refill forwarded for provider review.  Requested Prescriptions  Pending Prescriptions Disp Refills   gabapentin (NEURONTIN) 300 MG capsule [Pharmacy Med Name: GABAPENTIN 300MG    CAP] 90 capsule 1    Sig: TAKE 1 CAPSULE BY MOUTH THREE TIMES DAILY AS NEEDED     Neurology: Anticonvulsants - gabapentin Passed - 05/13/2018  9:48 AM      Passed - Valid encounter within last 12 months    Recent Outpatient Visits          3 weeks ago Controlled type 2 diabetes mellitus without complication, without long-term current use of insulin ([place])   [facility] Kathrine Haddock, NP   1 month ago Left hip pain   [facility] Merrie Roof [place], [place]   1 month ago Burning with urination   [place], [place], [place]   6 months ago Burning with urination   [place], NP   6 months ago Rib pain on left side   [facility] Kathrine Haddock, NP

## 2018-05-14 NOTE — Telephone Encounter (Signed)
ERROR

## 2018-05-14 NOTE — Telephone Encounter (Signed)
Refill sent in yesterday. Pt needs to make sure she isn't taking more than directed. Will discuss at next OV

## 2018-05-15 ENCOUNTER — Ambulatory Visit: Payer: Self-pay | Admitting: Internal Medicine

## 2018-05-15 ENCOUNTER — Encounter: Payer: Self-pay | Admitting: Internal Medicine

## 2018-05-15 VITALS — BP 126/80 | Ht 62.0 in | Wt 146.8 lb

## 2018-05-15 DIAGNOSIS — I5032 Chronic diastolic (congestive) heart failure: Secondary | ICD-10-CM

## 2018-05-15 DIAGNOSIS — R0789 Other chest pain: Secondary | ICD-10-CM

## 2018-05-15 MED ORDER — FUROSEMIDE 20 MG PO TABS: 20.0000 mg | ORAL_TABLET | Freq: Every day | ORAL | 3 refills | Status: DC

## 2018-05-15 NOTE — Patient Instructions (Signed)
Medication Instructions:  Your physician has recommended you make the following change in your medication:  TAKE Furosemide 20 mg by mouth once a day.  If you need a refill on your cardiac medications before your next appointment, please call your pharmacy.   Lab work: NONE If you have labs (blood work) drawn today and your tests are completely normal, you will receive your results only by: [name] MyChart Message (if you have MyChart) OR . A paper copy in the mail If you have any lab test that is abnormal or we need to change your treatment, we will call you to review the results.  Testing/Procedures: NONE  Follow-Up: At [facility], you and your health needs are our priority.  As part of our continuing mission to provide you with exceptional heart care, we have created designated Provider Care Teams.  These Care Teams include your primary Cardiologist (physician) and Advanced Practice Providers (APPs -  Physician Assistants and Nurse Practitioners) who all work together to provide you with the care you need, when you need it. You will need a follow up appointment in 12 months.  Please call our office 2 months in advance to schedule this appointment.  You may see DR Harrell Gave END or one of the following Advanced Practice Providers on your designated Care Team:   Murray Hodgkins, NP Christell Faith, PA-C . Marrianne Mood, PA-C

## 2018-05-16 ENCOUNTER — Encounter: Payer: Self-pay | Admitting: Internal Medicine

## 2018-05-16 DIAGNOSIS — I5033 Acute on chronic diastolic (congestive) heart failure: Secondary | ICD-10-CM | POA: Insufficient documentation

## 2018-05-16 DIAGNOSIS — I5032 Chronic diastolic (congestive) heart failure: Secondary | ICD-10-CM | POA: Insufficient documentation

## 2018-05-16 HISTORY — DX: Acute on chronic diastolic (congestive) heart failure: I50.33

## 2018-05-23 ENCOUNTER — Encounter: Payer: Self-pay | Admitting: Family Medicine

## 2018-05-23 ENCOUNTER — Ambulatory Visit (INDEPENDENT_AMBULATORY_CARE_PROVIDER_SITE_OTHER): Payer: Self-pay | Admitting: Family Medicine

## 2018-05-23 ENCOUNTER — Ambulatory Visit: Payer: Self-pay | Admitting: *Deleted

## 2018-05-23 VITALS — BP 121/84 | HR 90 | Temp 98.6°F | Wt 138.0 lb

## 2018-05-23 DIAGNOSIS — R399 Unspecified symptoms and signs involving the genitourinary system: Secondary | ICD-10-CM

## 2018-05-23 DIAGNOSIS — N390 Urinary tract infection, site not specified: Secondary | ICD-10-CM

## 2018-05-23 DIAGNOSIS — E119 Type 2 diabetes mellitus without complications: Secondary | ICD-10-CM

## 2018-05-23 DIAGNOSIS — E1165 Type 2 diabetes mellitus with hyperglycemia: Secondary | ICD-10-CM

## 2018-05-23 LAB — UA/M W/RFLX CULTURE, ROUTINE
Bilirubin, UA: NEGATIVE
KETONES UA: NEGATIVE
NITRITE UA: NEGATIVE
Protein, UA: NEGATIVE
RBC UA: NEGATIVE
SPEC GRAV UA: 1.01 (ref 1.005–1.030)
Urobilinogen, Ur: 0.2 mg/dL (ref 0.2–1.0)
pH, UA: 5 (ref 5.0–7.5)

## 2018-05-23 LAB — MICROSCOPIC EXAMINATION

## 2018-05-23 MED ORDER — SULFAMETHOXAZOLE-TRIMETHOPRIM 800-160 MG PO TABS: 1.0000 | ORAL_TABLET | Freq: Two times a day (BID) | ORAL | 0 refills | Status: DC

## 2018-05-23 MED ORDER — NITROFURANTOIN MONOHYD MACRO 100 MG PO CAPS: 100.0000 mg | ORAL_CAPSULE | Freq: Two times a day (BID) | ORAL | 0 refills | Status: DC

## 2018-05-23 MED ORDER — METFORMIN HCL 1000 MG PO TABS: ORAL_TABLET | ORAL | 1 refills | Status: DC

## 2018-05-23 MED ORDER — FLUCONAZOLE 150 MG PO TABS: 150.0000 mg | ORAL_TABLET | Freq: Once | ORAL | 0 refills | Status: AC

## 2018-05-23 MED ORDER — GLIPIZIDE 5 MG PO TABS: 5.0000 mg | ORAL_TABLET | Freq: Two times a day (BID) | ORAL | 1 refills | Status: DC

## 2018-05-23 NOTE — Telephone Encounter (Signed)
Patient notified that a new antibiotic is being sent in.

## 2018-05-23 NOTE — Assessment & Plan Note (Signed)
>>  ASSESSMENT AND PLAN FOR DIABETES MELLITUS TYPE 2 IN NONOBESE (HCC) WRITTEN ON 05/23/2018 12:33 PM BY LANE, RACHEL ELIZABETH, PA-C  Significant jump in A1C from the 6's to 12.6 last month. Discussed cleaning up her eating habits drastically and will add glipizide 5 mg BID to her metformin regimen. Can't afford the bydureon so never started on it. Suspect her recurrent UTIs are largely coming from her poorly controlled sugars currently.

## 2018-05-23 NOTE — Telephone Encounter (Signed)
Pt calling with complaints of itching all over and coughing. Pt also stating that it feels like her throat is getting hoarse. Pt states that she took 2 new medications (Glipizide and Bactrim) approximately 30 min before calling the office. Pt states she has already taken 3 benadryl and wants to know if the office could provide her with an EpiPen. Pt states if she goes to the hospital they will only hook her up to an IV and give her benadryl. Pt advised that based on current symptoms she needs to go to the ED to be evaluated. Pt states she is at home by herself and pt was advised to call 911. Pt verbalized understanding. While ending the call with the pt states now I have pain in my chest going into by back. Pt advised again to call 911 for current symptoms. Understanding verbalized. Reason for Disposition . [1] Widespread hives, itching or facial swelling AND [2] onset < 2 hours of exposure to high-risk allergen (e.g., sting, nuts, 1st dose of antibiotic)  Answer Assessment - Initial Assessment Questions 1. MAIN SYMPTOM: "What is your main symptom?" "How bad is it?"      Itching, coughing and feeling as if throat is getting hoarse 2. RESPIRATORY STATUS: "Are you having difficulty breathing?"  (e.g., yes/no, wheezing, unable to complete a sentence)      Pt able to talk clearly without difficulty 3. SWALLOWING: "Can you swallow?" (e.g., yes/no, food, fluid, saliva)      Pt did not voice any difficulty swallowing 4. VASCULAR STATUS: "Are you feeling weak?" If so, ask: "Can you stand and walk normally?"     No assessed 5. ONSET: "When did the reaction start?" (Minutes or hours ago)      Pt states  6. SUBSTANCE: "What are you reacting to?" "When did the contact occur?"      Pt states she was prescribed two new medications today which were Glipizide and Bactrim 7. PREVIOUS REACTION: "Have you ever reacted to it before?" If so, ask: "What happened that time?"     Pt states she does have an allergy to  Avelox and Cipro 8. EPINEPHRINE: "Do you have an epinephrine autoinjector (e.g., EpiPen)?"     No pt asking if the office would have a EpiPen for her current symptoms  Protocols used: ANAPHYLAXIS-A-AH

## 2018-05-23 NOTE — Assessment & Plan Note (Signed)
Significant jump in A1C from the 6's to 12.6 last month. Discussed cleaning up her eating habits drastically and will add glipizide 5 mg BID to her metformin regimen. Can't afford the bydureon so never started on it. Suspect her recurrent UTIs are largely coming from her poorly controlled sugars currently.

## 2018-05-23 NOTE — Assessment & Plan Note (Signed)
U/A +, will start bactrim and await cx. Per pt, this is her 7th UTI in the last few months. Work on compliance with estrogen cream, add probiotics, starkly reduce sugar intake to improve diabetes control and in turn reduce the amount of glucose in urine (currently 3+). Pt will contact her Urologist about her request to restart prophylactic macrobid

## 2018-05-23 NOTE — Telephone Encounter (Signed)
Patient agreeable. Would like to know if new antibiotic will be called in.

## 2018-05-23 NOTE — Telephone Encounter (Signed)
Please have her go to the ER

## 2018-05-23 NOTE — Progress Notes (Signed)
BP 121/84   Pulse 90   Temp 98.6 F (37 C) (Oral)   Wt 138 lb (62.6 kg)   LMP  (LMP Unknown)   SpO2 94%   BMI 25.24 kg/m    Subjective:    Patient ID: Amy Hansen, female    DOB: [birth year], 62 y.o.   MRN: [id number]  HPI: Amy Hansen is a 62 y.o. female  Chief Complaint  Patient presents with  . Urinary Tract Infection    started on Tuesday. Burning, urine odor   Here today with 2 days of dysuria, urine odor. Denies vaginal itching, discharge, hematuria, low back pain, N/V. Hx of interstitial cystitis - has been to Urologist in the past few months and given estrogen cream to help reduce frequency. Notes poor compliance with this so unsure if it's helping. Taking AZO prn with some relief. This is the 7th UTI that she's had in the past 6 months.   A1C significantly elevated per labs 9/27, states she has made some major dietary changes for the worse since losing her job last year and now eats a high sugar diet and does not exercise. Was started on bydureon earlier this year but does not have insurance and could not afford the medicine. Only taking metformin now. Does not check home sugars.   Past Medical History:  Diagnosis Date  . Anemia   . Anxiety   . Chronic kidney disease    UTI, hematuria in urine  . Depression   . Diabetes ([place])   . Diverticulosis   . Frequent headaches   . Interstitial cystitis   . Recurrent UTI   . Restless leg syndrome   . Urinary frequency    Social History   Socioeconomic History  . Marital status: Married    Spouse name: Not on file  . Number of children: Not on file  . Years of education: Not on file  . Highest education level: Not on file  Occupational History  . Not on file  Social Needs  . Financial resource strain: Not on file  . Food insecurity:    Worry: Not on file    Inability: Not on file  . Transportation needs:    Medical: Not on file    Non-medical: Not on file  Tobacco Use  . Smoking status: Former Smoker    Packs/day: 0.50    Types: Cigarettes    Last attempt to quit: 04/25/1975    Years since quitting: 43.1  . Smokeless tobacco: Never Used  . Tobacco comment: quit 40 years  Substance and Sexual Activity  . Alcohol use: No    Alcohol/week: 0.0 standard drinks  . Drug use: No  . Sexual activity: Never  Lifestyle  . Physical activity:    Days per week: Not on file    Minutes per session: Not on file  . Stress: Not on file  Relationships  . Social connections:    Talks on phone: Not on file    Gets together: Not on file    Attends religious service: Not on file    Active member of club or organization: Not on file    Attends meetings of clubs or organizations: Not on file    Relationship status: Not on file  . Intimate partner violence:    Fear of current or ex partner: Not on file    Emotionally abused: Not on file    Physically abused: Not on file    Forced sexual activity:  Not on file  Other Topics Concern  . Not on file  Social History Narrative   Caffeine 5 servings per day.    Relevant past medical, surgical, family and social history reviewed and updated as indicated. Interim medical history since our last visit reviewed. Allergies and medications reviewed and updated.  Review of Systems  Per HPI unless specifically indicated above     Objective:    BP 121/84   Pulse 90   Temp 98.6 F (37 C) (Oral)   Wt 138 lb (62.6 kg)   LMP  (LMP Unknown)   SpO2 94%   BMI 25.24 kg/m   Wt Readings from Last 3 Encounters:  05/23/18 138 lb (62.6 kg)  05/15/18 146 lb 12 oz (66.6 kg)  04/18/18 146 lb 12.8 oz (66.6 kg)    Physical Exam  Constitutional: She is oriented to person, place, and time. She appears well-developed and well-nourished. No distress.  HENT:  Head: Atraumatic.  Eyes: Conjunctivae and EOM are normal.  Neck: Neck supple.  Cardiovascular: Normal rate, regular rhythm and normal heart sounds.  Pulmonary/Chest: Effort normal and breath sounds normal.    Abdominal: Soft. Bowel sounds are normal. She exhibits no mass. There is no tenderness. There is no guarding.  Musculoskeletal: Normal range of motion. She exhibits no tenderness (No CVA tenderness b/l).  Neurological: She is alert and oriented to person, place, and time.  Skin: Skin is warm and dry.  Psychiatric: She has a normal mood and affect. Her behavior is normal.  Nursing note and vitals reviewed.   Results for orders placed or performed in visit on 04/18/18  Bayer DCA Hb A1c Waived  Result Value Ref Range   HB A1C (BAYER DCA - WAIVED) 12.6 (H) <7.0 %  Comprehensive metabolic panel  Result Value Ref Range   Glucose 316 (H) 65 - 99 mg/dL   BUN 7 (L) 8 - 27 mg/dL   Creatinine, Ser 0.78 0.57 - 1.00 mg/dL   GFR calc non Af Amer 83 >59 mL/min/1.73   GFR calc Af Amer 96 >59 mL/min/1.73   BUN/Creatinine Ratio 9 (L) 12 - 28   Sodium 134 134 - 144 mmol/L   Potassium 4.3 3.5 - 5.2 mmol/L   Chloride 94 (L) 96 - 106 mmol/L   CO2 24 20 - 29 mmol/L   Calcium 9.4 8.7 - 10.3 mg/dL   Total Protein 6.5 6.0 - 8.5 g/dL   Albumin 3.9 3.6 - 4.8 g/dL   Globulin, Total 2.6 1.5 - 4.5 g/dL   Albumin/Globulin Ratio 1.5 1.2 - 2.2   Bilirubin Total <0.2 0.0 - 1.2 mg/dL   Alkaline Phosphatase 175 (H) 39 - 117 IU/L   AST 12 0 - 40 IU/L   ALT 20 0 - 32 IU/L      Assessment & Plan:   Problem List Items Addressed This Visit      Endocrine   Poorly controlled type 2 diabetes mellitus ([place])    Significant jump in A1C from the 6's to 12.6 last month. Discussed cleaning up her eating habits drastically and will add glipizide 5 mg BID to her metformin regimen. Can't afford the bydureon so never started on it. Suspect her recurrent UTIs are largely coming from her poorly controlled sugars currently.       Relevant Medications   glipiZIDE (GLUCOTROL) 5 MG tablet   metFORMIN (GLUCOPHAGE) 1000 MG tablet     Genitourinary   Recurrent UTI    U/A +, will start bactrim  and await cx. Per pt, this is her  7th UTI in the last few months. Work on compliance with estrogen cream, add probiotics, starkly reduce sugar intake to improve diabetes control and in turn reduce the amount of glucose in urine (currently 3+). Pt will contact her Urologist about her request to restart prophylactic macrobid      Relevant Medications   sulfamethoxazole-trimethoprim (BACTRIM DS,SEPTRA DS) 800-160 MG tablet   fluconazole (DIFLUCAN) 150 MG tablet    Other Visit Diagnoses    Lower urinary tract symptoms (LUTS)    -  Primary   Relevant Orders   UA/M w/rflx Culture, Routine   Urine Culture   Type 2 diabetes mellitus without complication, without long-term current use of insulin ([facility])       Relevant Medications   glipiZIDE (GLUCOTROL) 5 MG tablet   metFORMIN (GLUCOPHAGE) 1000 MG tablet       Follow up plan: Return in about 3 months (around 08/23/2018) for DM.

## 2018-05-23 NOTE — Patient Instructions (Signed)
Follow up in 3 months

## 2018-05-23 NOTE — Telephone Encounter (Signed)
Yes, macrobid called in

## 2018-05-27 LAB — URINE CULTURE

## 2018-06-01 DIAGNOSIS — F314 Bipolar disorder, current episode depressed, severe, without psychotic features: Secondary | ICD-10-CM

## 2018-06-01 DIAGNOSIS — F329 Major depressive disorder, single episode, unspecified: Secondary | ICD-10-CM | POA: Insufficient documentation

## 2018-06-01 DIAGNOSIS — F429 Obsessive-compulsive disorder, unspecified: Secondary | ICD-10-CM | POA: Insufficient documentation

## 2018-06-03 ENCOUNTER — Other Ambulatory Visit: Payer: Self-pay | Admitting: Physician Assistant

## 2018-06-03 NOTE — Telephone Encounter (Signed)
Checking with provider

## 2018-06-04 ENCOUNTER — Other Ambulatory Visit: Payer: Self-pay

## 2018-06-16 ENCOUNTER — Telehealth: Payer: Self-pay | Admitting: Family Medicine

## 2018-06-16 ENCOUNTER — Other Ambulatory Visit: Payer: Self-pay | Admitting: Physician Assistant

## 2018-06-16 ENCOUNTER — Other Ambulatory Visit: Payer: Self-pay

## 2018-06-16 DIAGNOSIS — R399 Unspecified symptoms and signs involving the genitourinary system: Secondary | ICD-10-CM

## 2018-06-16 MED ORDER — CEPHALEXIN 500 MG PO CAPS: 500.0000 mg | ORAL_CAPSULE | Freq: Two times a day (BID) | ORAL | 0 refills | Status: DC

## 2018-06-16 NOTE — Telephone Encounter (Signed)
Left message for patient. DPR reviewed. Explained what Apolonio Schneiders advised.

## 2018-06-16 NOTE — Telephone Encounter (Signed)
I had asked her a month ago to bring in another specimen if she wasn't feeling better as there were multiple organisms and we couldn't get a good read on it. She can drop one off today and I will send in the keflex until we can see what needs to be given. She really needs to get back in with Urology as recommended last month

## 2018-06-16 NOTE — Telephone Encounter (Signed)
Copied from [place] [id number]. Topic: Quick Communication - See Telephone Encounter >> Jun 16, 2018  8:59 AM Blase Mess A wrote: CRM for notification. See Telephone encounter for: 06/16/18.  Patient is calling because she recently saw Rachael for an UTI the UTI is still persistent. Patient is requesting Keflex for her UTI that this time. Patient tried to schedule an appointment but Ralph Leyden is out of the office Please advise [phone]

## 2018-06-17 ENCOUNTER — Ambulatory Visit: Payer: Self-pay | Admitting: Family Medicine

## 2018-06-17 ENCOUNTER — Ambulatory Visit (INDEPENDENT_AMBULATORY_CARE_PROVIDER_SITE_OTHER): Payer: Self-pay | Admitting: Physician Assistant

## 2018-06-17 ENCOUNTER — Encounter: Payer: Self-pay | Admitting: Physician Assistant

## 2018-06-17 DIAGNOSIS — F411 Generalized anxiety disorder: Secondary | ICD-10-CM

## 2018-06-17 DIAGNOSIS — G4700 Insomnia, unspecified: Secondary | ICD-10-CM

## 2018-06-17 DIAGNOSIS — F319 Bipolar disorder, unspecified: Secondary | ICD-10-CM

## 2018-06-17 LAB — URINE CULTURE, REFLEX

## 2018-06-17 MED ORDER — SERTRALINE HCL 100 MG PO TABS: 100.0000 mg | ORAL_TABLET | ORAL | 1 refills | Status: DC

## 2018-06-17 MED ORDER — CARBAMAZEPINE 200 MG PO TABS: 100.0000 mg | ORAL_TABLET | Freq: Two times a day (BID) | ORAL | 1 refills | Status: DC

## 2018-06-17 MED ORDER — BUPROPION HCL ER (XL) 150 MG PO TB24: 450.0000 mg | ORAL_TABLET | Freq: Every day | ORAL | 1 refills | Status: DC

## 2018-06-17 NOTE — Progress Notes (Signed)
[facility] Check  Patient ID: Amy Hansen,  MRN: [id number]  PCP: Volney American, PA-C  Date of Evaluation: 06/17/2018 Time spent:15 minutes  Chief Complaint:  Chief Complaint    Follow-up      HISTORY/CURRENT STATUS: HPI For 6 month med check.  Since LOV, she either lost her bottle of Xanax or someone stole it.  (Notes are in paper chart) I had put her on carbamazepine which has helped with the anxiety.  She does state that she is been getting Xanax from a friend who had extra.  "I do not know what I would have done without it.  I feel so anxious all the time.  I have panic attacks at least one a day where I get palpitations and shortness of breath.  If I do not have the Xanax it is horrible.  The Xanax helps calm me down in about 15 to 20 minutes."  She is not working.  She has not been able to find a job.  She is considering applying for disability due to the bipolar disorder.  She also has myelodysplastic syndrome is stable present.  Patient denies loss of interest in usual activities and is able to enjoy things.  Denies decreased energy or motivation.  Appetite has not changed.  No extreme sadness, tearfulness, or feelings of hopelessness.  Denies any changes in concentration, making decisions or remembering things.  Denies suicidal or homicidal thoughts.  She sleeps well as long as she has the Ambien.  Otherwise she only sleeps 4 to 5 hours and is extremely tired the next day.  States she has fallen a couple of times since her last visit 6 months ago.  She missed a step in her home and fell.  There were no serious injuries.  She also fell backwards while riding an escalator at the mall.  She did not hit her head either time but from the incident at the mall, she did have back and neck injuries and had to go to physical therapy for a while.  It is a lot better now.  Individual Medical History/ Review of Systems: Changes? :No    Past medications for mental health  diagnoses include: Trazodone, Risperdal, Zoloft, Lunesta, praises son, [employer], Prozac, Depakote, Lamictal, lithium, Wellbutrin, Xanax, Ambien, carbamazepine  Allergies: Avelox [moxifloxacin hcl in nacl]; Bactrim [sulfamethoxazole-trimethoprim]; Depakote [divalproex sodium]; Imitrex [sumatriptan]; and Stadol [butorphanol]  Current Medications:  Current Outpatient Medications:  .  ALPRAZolam (XANAX) 0.5 MG tablet, TAKE 1 TABLET BY MOUTH EVERY 6 HOURS AS NEEDED FOR ANXIETY, Disp: 360 tablet, Rfl: 0 .  buPROPion (WELLBUTRIN XL) 150 MG 24 hr tablet, Take 3 tablets (450 mg total) by mouth daily., Disp: 270 tablet, Rfl: 1 .  butalbital-aspirin-caffeine (FIORINAL) 50-325-40 MG capsule, TAKE 1 CAPSULE BY MOUTH TWICE DAILY AS NEEDED FOR HEADACHE, Disp: 14 capsule, Rfl: 0 .  cephALEXin (KEFLEX) 500 MG capsule, Take 1 capsule (500 mg total) by mouth 2 (two) times daily., Disp: 14 capsule, Rfl: 0 .  estradiol (ESTRACE VAGINAL) 0.1 MG/GM vaginal cream, Apply 0.38m (pea-sized amount)  just inside the vaginal introitus with a finger-tip on Monday, Wednesday and Friday nights., Disp: 30 g, Rfl: 12 .  fluticasone (FLONASE) 50 MCG/ACT nasal spray, Place 2 sprays into the nose as needed. , Disp: , Rfl:  .  furosemide (LASIX) 20 MG tablet, Take 1 tablet (20 mg total) by mouth daily., Disp: 90 tablet, Rfl: 3 .  glipiZIDE (GLUCOTROL) 5 MG tablet, Take 1 tablet (5 mg  total) by mouth 2 (two) times daily before a meal., Disp: 180 tablet, Rfl: 1 .  lamoTRIgine (LAMICTAL) 200 MG tablet, Take 400 mg by mouth. Take 2 tablets daily at bedtime, Disp: , Rfl:  .  metFORMIN (GLUCOPHAGE) 1000 MG tablet, TAKE 1 TABLET BY MOUTH TWO  TIMES DAILY WITH MEALS, Disp: 180 tablet, Rfl: 1 .  Meth-Hyo-M Bl-Na Phos-Ph Sal (URETRON D/S) TABS, Take 1 tablet by mouth every 6 (six) hours as needed., Disp: 120 each, Rfl: 0 .  naproxen (NAPROXEN DR) 500 MG EC tablet, Take 1 tablet (500 mg total) 2 (two) times daily with a meal by mouth., Disp: 10  tablet, Rfl: 0 .  nitrofurantoin, macrocrystal-monohydrate, (MACROBID) 100 MG capsule, Take 1 capsule (100 mg total) by mouth 2 (two) times daily., Disp: 14 capsule, Rfl: 0 .  omeprazole (PRILOSEC OTC) 20 MG tablet, Take 1 tablet (20 mg total) by mouth daily., Disp: 90 tablet, Rfl: 1 .  ondansetron (ZOFRAN ODT) 4 MG disintegrating tablet, Take 1 tablet (4 mg total) by mouth every 8 (eight) hours as needed., Disp: 40 tablet, Rfl: 0 .  ONE TOUCH ULTRA TEST test strip, daily., Disp: , Rfl:  .  pramipexole (MIRAPEX) 0.5 MG tablet, Take 1 tablet (0.5 mg total) by mouth at bedtime., Disp: 90 tablet, Rfl: 3 .  risperiDONE (RISPERDAL) 3 MG tablet, Take 3 mg by mouth at bedtime. , Disp: , Rfl:  .  sertraline (ZOLOFT) 100 MG tablet, Take 1 tablet (100 mg total) by mouth every morning., Disp: 90 tablet, Rfl: 1 .  zolpidem (AMBIEN) 10 MG tablet, TAKE 1/2 TO 1 (ONE-HALF TO ONE) TABLET BY MOUTH AT BEDTIME AS NEEDED FOR SLEEP. MAY FILL 03/06/18, Disp: 90 tablet, Rfl: 0 .  carbamazepine (TEGRETOL) 200 MG tablet, Take 0.5 tablets (100 mg total) by mouth 2 (two) times daily., Disp: 90 tablet, Rfl: 1 .  cyclobenzaprine (FLEXERIL) 10 MG tablet, Take 1 tablet (10 mg total) by mouth 3 (three) times daily as needed for muscle spasms. (Patient not taking: Reported on 06/17/2018), Disp: 90 tablet, Rfl: 0 Medication Side Effects: none  Family Medical/ Social History: Changes? No  MENTAL HEALTH EXAM:  There were no vitals taken for this visit.There is no height or weight on file to calculate BMI.  General Appearance: Casual  Eye Contact:  Good  Speech:  Clear and Coherent  Volume:  Normal  Mood:  Euthymic  Affect:  Appropriate  Thought Process:  Goal Directed  Orientation:  Full (Time, Place, and Person)  Thought Content: Logical   Suicidal Thoughts:  No  Homicidal Thoughts:  No  Memory:  WNL  Judgement:  Good  Insight:  Good  Psychomotor Activity:  Normal  Concentration:  Concentration: Good  Recall:  Good   Fund of Knowledge: Good  Language: Good  Assets:  Desire for Improvement  ADL's:  Intact  Cognition: WNL  Prognosis:  Good    DIAGNOSES:    ICD-10-CM   1. Bipolar I disorder ([place]) F31.9   2. Generalized anxiety disorder F41.1   3. Insomnia, unspecified type G47.00   Type 2 diabetes, recurrent UTI, restless leg syndrome, vitamin D deficiency, B12 deficiency, iron deficiency anemia, OCD, GERD, interstitial cystitis  Receiving Psychotherapy: No    RECOMMENDATIONS: Discussed the loss of the Xanax bottle.  She has been told that I will no longer prescribe a 90 day supply of any controlled substances.  Ambien and Xanax were filled a few weeks ago, for 90 days, but she's aware  only a 30 day supply of controlled substances, with RF,  will be given from now on.  She needs to only carry enough meds with her for a few days, not the entire bottle, when she shops, or whatever.  Since the carbamazepine seems to be helping with the anxiety, we will continue that. She is aware that it is illegal to get medications from another person or to share hers with someone else.  She is also aware that I will not refill meds early or prescribe meds if prescriptions are lost or stolen. Continue all other current medications. Recommend psychotherapy. As far as disability goes, I feel that the bipolar disorder is stable at least at the moment.  That can change quickly however. Return in 6 months or sooner as needed  Donnal Moat, PA-C

## 2018-06-18 ENCOUNTER — Telehealth: Payer: Self-pay | Admitting: Family Medicine

## 2018-06-18 LAB — UA/M W/RFLX CULTURE, ROUTINE
BILIRUBIN UA: NEGATIVE
KETONES UA: NEGATIVE
Nitrite, UA: POSITIVE — AB
Protein, UA: NEGATIVE
RBC, UA: NEGATIVE
SPEC GRAV UA: 1.01 (ref 1.005–1.030)
Urobilinogen, Ur: 0.2 mg/dL (ref 0.2–1.0)
pH, UA: 5.5 (ref 5.0–7.5)

## 2018-06-18 LAB — MICROSCOPIC EXAMINATION
EPITHELIAL CELLS (NON RENAL): NONE SEEN /HPF (ref 0–10)
RBC, UA: NONE SEEN /hpf (ref 0–2)

## 2018-06-18 MED ORDER — NITROFURANTOIN MONOHYD MACRO 100 MG PO CAPS: 100.0000 mg | ORAL_CAPSULE | Freq: Two times a day (BID) | ORAL | 0 refills | Status: DC

## 2018-06-18 NOTE — Telephone Encounter (Signed)
Please let her know that it looks like she does have a UTI again. I've sent through an antibiotic and we'll wait on the culture. Thanks!

## 2018-06-18 NOTE — Telephone Encounter (Addendum)
Message relayed to patient. Pt states she was given Keflex by Mrs. Orene Desanctis, PA-C on Monday.

## 2018-06-19 LAB — URINE CULTURE

## 2018-06-20 ENCOUNTER — Other Ambulatory Visit: Payer: Self-pay

## 2018-07-10 ENCOUNTER — Other Ambulatory Visit: Payer: Self-pay | Admitting: Physician Assistant

## 2018-07-10 ENCOUNTER — Other Ambulatory Visit: Payer: Self-pay | Admitting: Unknown Physician Specialty

## 2018-07-10 DIAGNOSIS — R0789 Other chest pain: Secondary | ICD-10-CM

## 2018-07-11 ENCOUNTER — Other Ambulatory Visit: Payer: Self-pay

## 2018-07-11 ENCOUNTER — Encounter: Payer: Self-pay | Admitting: Family Medicine

## 2018-07-11 ENCOUNTER — Ambulatory Visit (INDEPENDENT_AMBULATORY_CARE_PROVIDER_SITE_OTHER): Payer: Self-pay | Admitting: Family Medicine

## 2018-07-11 VITALS — BP 124/81 | HR 79 | Temp 98.3°F | Ht 61.0 in | Wt 145.0 lb

## 2018-07-11 DIAGNOSIS — G43009 Migraine without aura, not intractable, without status migrainosus: Secondary | ICD-10-CM

## 2018-07-11 DIAGNOSIS — M62838 Other muscle spasm: Secondary | ICD-10-CM

## 2018-07-11 DIAGNOSIS — R110 Nausea: Secondary | ICD-10-CM

## 2018-07-11 MED ORDER — CYCLOBENZAPRINE HCL 10 MG PO TABS: 10.0000 mg | ORAL_TABLET | Freq: Three times a day (TID) | ORAL | 0 refills | Status: DC | PRN

## 2018-07-11 MED ORDER — RIZATRIPTAN BENZOATE 10 MG PO TABS: 10.0000 mg | ORAL_TABLET | ORAL | 0 refills | Status: DC | PRN

## 2018-07-11 MED ORDER — ONDANSETRON HCL 4 MG PO TABS: 4.0000 mg | ORAL_TABLET | Freq: Three times a day (TID) | ORAL | 0 refills | Status: DC | PRN

## 2018-07-11 MED ORDER — BUTALBITAL-ASPIRIN-CAFFEINE 50-325-40 MG PO CAPS: 1.0000 | ORAL_CAPSULE | Freq: Every day | ORAL | 0 refills | Status: DC | PRN

## 2018-07-11 NOTE — Progress Notes (Signed)
BP 124/81   Pulse 79   Temp 98.3 F (36.8 C) (Oral)   Ht 5' 1"  (1.549 m)   Wt 145 lb (65.8 kg)   LMP  (LMP Unknown)   SpO2 97%   BMI 27.40 kg/m    Subjective:    Patient ID: Amy Hansen, female    DOB: Oct 04, 1955, 62 y.o.   MRN: [id number]  HPI: Amy Hansen is a 62 y.o. female  Chief Complaint  Patient presents with  . Migraine    medication refill   Here today for migraine f/u. Currently on fiorinal prn. Getting 5+ migraines monthly that can last days at a time. Currently has had a Migraine x 4 days and is out of medicine. Has failed imitrex in the past due to joint pain side effects. Does state she has significant neck soreness associated with her migraines for which she was previously on flexeril, and takes zofran prn for the associated nausea  Relevant past medical, surgical, family and social history reviewed and updated as indicated. Interim medical history since our last visit reviewed. Allergies and medications reviewed and updated.  Review of Systems  Per HPI unless specifically indicated above     Objective:    BP 124/81   Pulse 79   Temp 98.3 F (36.8 C) (Oral)   Ht 5' 1"  (1.549 m)   Wt 145 lb (65.8 kg)   LMP  (LMP Unknown)   SpO2 97%   BMI 27.40 kg/m   Wt Readings from Last 3 Encounters:  07/11/18 145 lb (65.8 kg)  05/23/18 138 lb (62.6 kg)  05/15/18 146 lb 12 oz (66.6 kg)    Physical Exam Vitals signs and nursing note reviewed.  Constitutional:      Appearance: Normal appearance. She is not ill-appearing.  HENT:     Head: Atraumatic.  Eyes:     Extraocular Movements: Extraocular movements intact.     Conjunctiva/sclera: Conjunctivae normal.     Pupils: Pupils are equal, round, and reactive to light.  Neck:     Musculoskeletal: Normal range of motion and neck supple. Muscular tenderness (b/l trapezius soreness) present.  Cardiovascular:     Rate and Rhythm: Normal rate and regular rhythm.     Pulses: Normal pulses.     Heart sounds:  Normal heart sounds.  Pulmonary:     Effort: Pulmonary effort is normal.     Breath sounds: Normal breath sounds.  Musculoskeletal: Normal range of motion.  Skin:    General: Skin is warm and dry.  Neurological:     General: No focal deficit present.     Mental Status: She is alert and oriented to person, place, and time.  Psychiatric:        Mood and Affect: Mood normal.        Behavior: Behavior normal.        Thought Content: Thought content normal.     Results for orders placed or performed in visit on 06/16/18  Urine Culture  Result Value Ref Range   Urine Culture, Routine Final report (A)    Organism ID, Bacteria Escherichia coli (A)    Antimicrobial Susceptibility Comment   Microscopic Examination  Result Value Ref Range   WBC, UA 0-5 0 - 5 /hpf   RBC, UA None seen 0 - 2 /hpf   Epithelial Cells (non renal) None seen 0 - 10 /hpf   Bacteria, UA Many (A) None seen/Few  Urine Culture, Reflex  Result Value Ref  Range   Urine Culture, Routine WILL FOLLOW   UA/M w/rflx Culture, Routine  Result Value Ref Range   Specific Gravity, UA 1.010 1.005 - 1.030   pH, UA 5.5 5.0 - 7.5   Color, UA Yellow Yellow   Appearance Ur Cloudy (A) Clear   Leukocytes, UA 1+ (A) Negative   Protein, UA Negative Negative/Trace   Glucose, UA Trace (A) Negative   Ketones, UA Negative Negative   RBC, UA Negative Negative   Bilirubin, UA Negative Negative   Urobilinogen, Ur 0.2 0.2 - 1.0 mg/dL   Nitrite, UA Positive (A) Negative   Microscopic Examination See below:       Assessment & Plan:   Problem List Items Addressed This Visit      Cardiovascular and Mediastinum   Migraines - Primary    Long discussion about risks with fiorinal and concerns about rebound headaches. Side effects in the past with imitrex, but will trial maxalt and monitor closely for benefit. Will give 15 fiorinal tabs and pt aware this should last year a year. She does plan to see a Neurologist to discuss this in the next  few months. Unable to try aimovig or emgality as she's self pay at this time and those are cost-prohibitive.      Relevant Medications   rizatriptan (MAXALT) 10 MG tablet   cyclobenzaprine (FLEXERIL) 10 MG tablet   butalbital-aspirin-caffeine (FIORINAL) 50-325-40 MG capsule    Other Visit Diagnoses    Nausea       related to migraines. zofran prn sent   Neck muscle spasm       Flexeril prn for neck soreness     [place] Controlled Substance Database reviewed and appropriate. Controlled substance agreement has been signed and scanned into patient's chart. Risks of sedation and addiction reviewed, particularly in conjunction with her prescribed xanax and ambien. She knows not to mix these   Follow up plan: Return for as scheduled.

## 2018-07-12 NOTE — Assessment & Plan Note (Signed)
Long discussion about risks with fiorinal and concerns about rebound headaches. Side effects in the past with imitrex, but will trial maxalt and monitor closely for benefit. Will give 15 fiorinal tabs and pt aware this should last year a year. She does plan to see a Neurologist to discuss this in the next few months. Unable to try aimovig or emgality as she's self pay at this time and those are cost-prohibitive.

## 2018-07-13 ENCOUNTER — Other Ambulatory Visit: Payer: Self-pay | Admitting: Family Medicine

## 2018-07-13 DIAGNOSIS — M79605 Pain in left leg: Secondary | ICD-10-CM

## 2018-07-29 ENCOUNTER — Telehealth: Payer: Self-pay | Admitting: Family Medicine

## 2018-07-29 NOTE — Telephone Encounter (Signed)
Needs appt unfortunately, but probably should just go ahead and see Urology as I've recommended to her multiple times given the frequency of her infections. She can just call and schedule with them (I am happy to work her in for this acute episode if they can't but she does need to see them soon for this)  Copied from [place] [id number]. Topic: General - Inquiry >> Jul 29, 2018 10:08 AM Alanda Slim E wrote: Reason for CRM: Pt called in stating she believes she has a UTI. Patient requested to be seen today/ advised Pt of schedule. She asked if she can come in and leave a urine specimen like she's done before and have something called in to treat it/ please advise

## 2018-07-29 NOTE — Telephone Encounter (Signed)
Message relayed to patient. Verbalized understanding and denied questions.   

## 2018-07-31 ENCOUNTER — Other Ambulatory Visit: Payer: BLUE CROSS/BLUE SHIELD

## 2018-07-31 DIAGNOSIS — N941 Unspecified dyspareunia: Secondary | ICD-10-CM

## 2018-07-31 DIAGNOSIS — Z87448 Personal history of other diseases of urinary system: Secondary | ICD-10-CM

## 2018-07-31 DIAGNOSIS — Z87440 Personal history of urinary (tract) infections: Secondary | ICD-10-CM

## 2018-07-31 DIAGNOSIS — N952 Postmenopausal atrophic vaginitis: Secondary | ICD-10-CM

## 2018-07-31 DIAGNOSIS — N390 Urinary tract infection, site not specified: Secondary | ICD-10-CM

## 2018-07-31 LAB — URINALYSIS, COMPLETE
BILIRUBIN UA: NEGATIVE
KETONES UA: NEGATIVE
Nitrite, UA: NEGATIVE
Protein, UA: NEGATIVE
Specific Gravity, UA: <1.005 — ABNORMAL LOW (ref 1.005–1.030)
Urobilinogen, Ur: 0.2 mg/dL (ref 0.2–1.0)
pH, UA: 5.5 (ref 5.0–7.5)

## 2018-07-31 LAB — MICROSCOPIC EXAMINATION

## 2018-08-04 ENCOUNTER — Telehealth: Payer: Self-pay

## 2018-08-04 LAB — CULTURE, URINE COMPREHENSIVE

## 2018-08-04 MED ORDER — CEPHALEXIN 500 MG PO CAPS: 500.0000 mg | ORAL_CAPSULE | Freq: Two times a day (BID) | ORAL | 0 refills | Status: DC

## 2018-08-04 NOTE — Telephone Encounter (Signed)
-----   Message from Nori Riis, PA-C sent at 08/04/2018 12:13 PM EST ----- I do not see a noted in the chart as to why patient dropped off her urine.  Please have Amy Hansen start Keflex 500 mg, BID x seven days.  She will need to have an appointment after she completes her antibiotic per her PCP for rUTI's.

## 2018-08-06 ENCOUNTER — Encounter: Payer: Self-pay | Admitting: Urology

## 2018-08-06 ENCOUNTER — Ambulatory Visit (INDEPENDENT_AMBULATORY_CARE_PROVIDER_SITE_OTHER): Payer: BLUE CROSS/BLUE SHIELD | Admitting: Urology

## 2018-08-06 VITALS — BP 147/100 | HR 105 | Ht 62.0 in | Wt 143.0 lb

## 2018-08-06 DIAGNOSIS — Z87448 Personal history of other diseases of urinary system: Secondary | ICD-10-CM

## 2018-08-06 DIAGNOSIS — N390 Urinary tract infection, site not specified: Secondary | ICD-10-CM

## 2018-08-06 LAB — URINALYSIS, COMPLETE
Bilirubin, UA: NEGATIVE
KETONES UA: NEGATIVE
Nitrite, UA: POSITIVE — AB
Protein, UA: NEGATIVE
RBC, UA: NEGATIVE
SPEC GRAV UA: 1.015 (ref 1.005–1.030)
Urobilinogen, Ur: 0.2 mg/dL (ref 0.2–1.0)
pH, UA: 7 (ref 5.0–7.5)

## 2018-08-06 LAB — MICROSCOPIC EXAMINATION: RBC, UA: NONE SEEN /hpf (ref 0–2)

## 2018-08-06 NOTE — Progress Notes (Signed)
08/06/2018 11:50 AM   Amy Hansen 06-21-1956 [id number]  Referring provider: Volney American, PA-C [address] [name],  [address]  No chief complaint on file.   HPI: Patient is a 63 year old Caucasian female with a history of hematuria, IC and history of rUTI's who currently has an UTI presents today with the complaint of seeing blood on her tissue paper when she wipes and back pain.     Background History, rUTI's:  She states that she had foul smelling urine, dysuria and frequency with her infections.  She is not using any vaginal estrogen cream.   Patient denies any gross hematuria, dysuria or suprapubic/flank pain.  Patient denies any fevers, chills, nausea or vomiting.   She has had three UTI's back to back over the summer.  She is drinking diet colas, six packs daily.  Her UA today is positive for moderate bacteria.  She feels that she may be starting another urinary tract infection at this time.    rUTI's She is complaining of right lower back pain, started 500 mg Keflex on yesterday, 08/05/2018 with 3 doses so far. She is worried about her kidney function. She reports associated hematuria, chills, and diarrhea. She notices blood on the tissue when she wipes. She consumes diet ginger ale daily with a small amount of water. She is not currently taking a probiotic, however, she used to take probiotics, but they caused her to have an upset stomach. She doesn't consume diet Dr. Malachi Bonds or tea. She is not sexually active and she doesn't soak in tubs. She denies fever, constipation, and any other symptoms. She doesn't drink enough water per day. She has had a hysterectomy.   History of hematuria She was seen in [facility]  ED on 05/22/2015 for gross hematuria and had a non-contrast CT that demonstrated no renal, ureteral or bladder calculi.  Her urine culture at that time was negative for infection.  She was still having significant bladder symptoms, so she underwent cystoscopy with  bilateral retrogrades on  06/06/2015 and no GU pathology was identified.   UA is negative for hematuria.    History of IC Previous PVR was 0 mL.    PMH: Past Medical History:  Diagnosis Date  . Anemia   . Anxiety   . Chronic kidney disease    UTI, hematuria in urine  . Depression   . Diabetes ([place])   . Diverticulosis   . Frequent headaches   . Interstitial cystitis   . Recurrent UTI   . Restless leg syndrome   . Urinary frequency     Surgical History: Past Surgical History:  Procedure Laterality Date  . ABDOMINAL HYSTERECTOMY    . bariatric bypass  2012  . CARPAL TUNNEL RELEASE Right 2003  . CARPAL TUNNEL RELEASE Right    2008  . CHOLECYSTECTOMY  1975  . CYSTOSCOPY W/ RETROGRADES Bilateral 06/06/2015   Procedure: CYSTOSCOPY WITH RETROGRADE PYELOGRAM;  Surgeon: Festus Aloe, MD;  Location: [facility] ORS;  Service: Urology;  Laterality: Bilateral;  . FL INJ LEFT KNEE CT ARTHROGRAM ([facility] HX) Left    1995  . GASTRIC BYPASS  2010  . [place]  2013  . KNEE ARTHROSCOPY Left 1996  . TONSILLECTOMY      Home Medications:  Allergies as of 08/06/2018      Reactions   Avelox [moxifloxacin Hcl In Nacl] Anaphylaxis   Bactrim [sulfamethoxazole-trimethoprim] Anaphylaxis   Depakote [divalproex Sodium]    Imitrex [sumatriptan] Other (See Comments)  Neck and shoulder pain   Stadol [butorphanol] Rash      Medication List       Accurate as of August 06, 2018 11:50 AM. Always use your most recent med list.        ALPRAZolam 0.5 MG tablet Commonly known as:  XANAX TAKE 1 TABLET BY MOUTH EVERY 6 HOURS AS NEEDED FOR ANXIETY   buPROPion 150 MG 24 hr tablet Commonly known as:  WELLBUTRIN XL Take 3 tablets (450 mg total) by mouth daily.   butalbital-aspirin-caffeine 50-325-40 MG capsule Commonly known as:  FIORINAL Take 1 capsule by mouth daily as needed for headache.   cephALEXin 500 MG capsule Commonly known as:  KEFLEX Take 1 capsule (500 mg total) by mouth 2  (two) times daily.   cephALEXin 500 MG capsule Commonly known as:  KEFLEX Take 1 capsule (500 mg total) by mouth 2 (two) times daily.   cyclobenzaprine 10 MG tablet Commonly known as:  FLEXERIL Take 1 tablet (10 mg total) by mouth 3 (three) times daily as needed for muscle spasms.   cyclobenzaprine 10 MG tablet Commonly known as:  FLEXERIL Take 1 tablet (10 mg total) by mouth 3 (three) times daily as needed for muscle spasms.   estradiol 0.1 MG/GM vaginal cream Commonly known as:  ESTRACE VAGINAL Apply 0.50m (pea-sized amount)  just inside the vaginal introitus with a finger-tip on Monday, Wednesday and Friday nights.   fluticasone 50 MCG/ACT nasal spray Commonly known as:  FLONASE Place 2 sprays into the nose as needed.   furosemide 20 MG tablet Commonly known as:  LASIX Take 1 tablet (20 mg total) by mouth daily.   glipiZIDE 5 MG tablet Commonly known as:  GLUCOTROL Take 1 tablet (5 mg total) by mouth 2 (two) times daily before a meal.   lamoTRIgine 200 MG tablet Commonly known as:  LAMICTAL Take 400 mg by mouth. Take 2 tablets daily at bedtime   metFORMIN 1000 MG tablet Commonly known as:  GLUCOPHAGE TAKE 1 TABLET BY MOUTH TWO  TIMES DAILY WITH MEALS   naproxen 500 MG EC tablet Commonly known as:  NAPROXEN DR Take 1 tablet (500 mg total) 2 (two) times daily with a meal by mouth.   nitrofurantoin (macrocrystal-monohydrate) 100 MG capsule Commonly known as:  MACROBID Take 1 capsule (100 mg total) by mouth 2 (two) times daily.   omeprazole 20 MG tablet Commonly known as:  PRILOSEC OTC Take 1 tablet (20 mg total) by mouth daily.   ondansetron 4 MG tablet Commonly known as:  ZOFRAN Take 1 tablet (4 mg total) by mouth every 8 (eight) hours as needed for nausea or vomiting.   ONE TOUCH ULTRA TEST test strip Generic drug:  glucose blood daily.   pramipexole 0.5 MG tablet Commonly known as:  MIRAPEX Take 1 tablet (0.5 mg total) by mouth at bedtime.     risperiDONE 3 MG tablet Commonly known as:  RISPERDAL Take 3 mg by mouth at bedtime.   rizatriptan 10 MG tablet Commonly known as:  MAXALT Take 1 tablet (10 mg total) by mouth as needed for migraine. May repeat in 2 hours if needed   sertraline 100 MG tablet Commonly known as:  ZOLOFT Take 1 tablet (100 mg total) by mouth every morning.   URETRON D/S Tabs Take 1 tablet by mouth every 6 (six) hours as needed.   zolpidem 10 MG tablet Commonly known as:  AMBIEN TAKE 1/2 TO 1 (ONE-HALF TO ONE) TABLET BY MOUTH AT  BEDTIME AS NEEDED FOR SLEEP. MAY FILL 03/06/18       Allergies:  Allergies  Allergen Reactions  . Avelox [Moxifloxacin Hcl In Nacl] Anaphylaxis  . Bactrim [Sulfamethoxazole-Trimethoprim] Anaphylaxis  . Depakote [Divalproex Sodium]   . Imitrex [Sumatriptan] Other (See Comments)    Neck and shoulder pain  . Stadol [Butorphanol] Rash    Family History: Family History  Problem Relation Age of Onset  . Stroke Father   . Heart failure Sister   . Bladder Cancer Neg Hx   . Kidney disease Neg Hx   . Prostate cancer Neg Hx   . Kidney cancer Neg Hx     Social History:  reports that she quit smoking about 43 years ago. Her smoking use included cigarettes. She smoked 0.50 packs per day. She has never used smokeless tobacco. She reports that she does not drink alcohol or use drugs.  ROS: UROLOGY Frequent Urination?: Yes Hard to postpone urination?: Yes Burning/pain with urination?: No Get up at night to urinate?: No Leakage of urine?: No Urine stream starts and stops?: No Trouble starting stream?: No Do you have to strain to urinate?: No Blood in urine?: Yes Urinary tract infection?: Yes Sexually transmitted disease?: No Injury to kidneys or bladder?: No Painful intercourse?: No Weak stream?: No Currently pregnant?: No Vaginal bleeding?: No Last menstrual period?: n  Gastrointestinal Nausea?: Yes Vomiting?: No Indigestion/heartburn?: No Diarrhea?:  No Constipation?: No  Constitutional Fever: No Night sweats?: Yes Weight loss?: No Fatigue?: No  Skin Skin rash/lesions?: No Itching?: No  Eyes Blurred vision?: No Double vision?: No  Ears/Nose/Throat Sore throat?: No Sinus problems?: No  Hematologic/Lymphatic Swollen glands?: No Easy bruising?: No  Cardiovascular Leg swelling?: Yes Chest pain?: No  Respiratory Cough?: Yes Shortness of breath?: No  Endocrine Excessive thirst?: No  Musculoskeletal Back pain?: Yes Joint pain?: No  Neurological Headaches?: Yes Dizziness?: No  Psychologic Depression?: No Anxiety?: No  Physical Exam: BP (!) 147/100   Pulse (!) 105   Ht 5' 2"  (1.575 m)   Wt 143 lb (64.9 kg)   LMP  (LMP Unknown)   BMI 26.16 kg/m   Constitutional: Well nourished. Alert and oriented, No acute distress. Cardiovascular: No clubbing, cyanosis, or edema. Respiratory: Normal respiratory effort, no increased work of breathing. Neurologic: Grossly intact, no focal deficits, moving all 4 extremities. Psychiatric: Normal mood and affect.  Laboratory Data: Lab Results  Component Value Date   WBC 4.4 10/17/2016   HGB 10.7 (L) 10/17/2016   HCT 34.1 10/17/2016   MCV 75 (L) 10/17/2016   PLT 368 10/17/2016    Lab Results  Component Value Date   CREATININE 0.78 04/18/2018    Lab Results  Component Value Date   HGBA1C 12.6 (H) 04/18/2018    Urinalysis Microscopic UA from 07/31/2018 showed: moderate bacteria, WBC 0-5, RBC 0-5. UA culture with E. Coli growth. See EPIC.   I have reviewed the labs  Pertinent imaging Results for KAMIA, INSALACO (MRN [id number]) as of 04/07/2018 11:12  Ref. Range 04/07/2018 10:24  Scan Result Unknown 0    Assessment & Plan:    1. rUTI's -Microscopic UA from 07/31/2018 showed: moderate bacteria, WBC 0-5, RBC 0-5. UA culture with E. Coli growth.   -Patient on 500 mg Keflex started on yesterday, 08/05/2018 (she has taken 3 total doses so far). Advised to continue  with this antibiotic and that symptoms may not start to improve until the patient is 3 days into the abx course.  -Patient is instructed  to discontinue drinking diet colas and increase water intake up to 8-10 cups per day, consume yogurt, and consuming cranberry juice.  -Patient advised to avoid soaking in tubs.  -Advised the patient to continue on her vaginal estrogen cream three times a week.  -Reviewed symptoms of UTI (fevers, chills, gross hematuria, mental status changes, dysuria, suprapubic pain, back pain and/or sudden worsening of urinary symptoms) and advised not to have urine checked or be treated for UTI if not experiencing symptoms       -RTC in 2 weeks for recheck                                  2. History of hematuria -work up in 2016 was negative  -Patient noted previous occurrences of hematuria and blood noted on tissue while wiping.  -UA from 07/31/2018 negative for hematuria -RTC in 2 weeks for recheck     3. History of IC Currently no symptoms of IC   Return in about 2 weeks (around 08/20/2018).  [facility] [address] [place], [place] [address] [phone]  I, [place], am acting as a Education administrator for [employer].   I have reviewed the above documentation for accuracy and completeness, and I agree with the above.    Zara Council, PA-C

## 2018-08-17 ENCOUNTER — Other Ambulatory Visit: Payer: Self-pay | Admitting: Physician Assistant

## 2018-08-21 ENCOUNTER — Encounter: Payer: Self-pay | Admitting: Urology

## 2018-08-21 ENCOUNTER — Ambulatory Visit (INDEPENDENT_AMBULATORY_CARE_PROVIDER_SITE_OTHER): Payer: Self-pay | Admitting: Urology

## 2018-08-21 VITALS — BP 130/78 | HR 99 | Ht 62.0 in | Wt 143.0 lb

## 2018-08-21 DIAGNOSIS — N390 Urinary tract infection, site not specified: Secondary | ICD-10-CM

## 2018-08-21 NOTE — Progress Notes (Signed)
08/21/2018 1:06 PM   Amy Hansen 01/17/1956 [id number]  Referring provider: Volney American, PA-C [address] [place], [place] [address]  Chief Complaint  Patient presents with  . Hematuria    HPI: Patient is a 63 year old Caucasian female with a history of hematuria, [name]   Pt left without completing treatment.   rUTI's She is complaining of right lower back pain, started 500 mg Keflex on yesterday, 08/05/2018 with 3 doses so far. She is worried about her kidney function. She reports associated hematuria, chills, and diarrhea. She notices blood on the tissue when she wipes. She consumes diet ginger ale daily with a small amount of water. She is not currently taking a probiotic, however, she used to take probiotics, but they caused her to have an upset stomach. She doesn't consume diet Dr. Malachi Bonds or tea. She is not sexually active and she doesn't soak in tubs. She denies fever, constipation, and any other symptoms. She doesn't drink enough water per day. She has had a hysterectomy.   She reports of improvement in symptoms starting last week. She reports of nausea and her urine is cloudy. She denies burning with urination, gross hematuria, flank pain and a little pain in the suprapubic area.   Her UA positive for  0-5 WBCs and many bacteria.   History of hematuria She was seen in [facility]  ED on 05/22/2015 for gross hematuria and had a non-contrast CT that demonstrated no renal, ureteral or bladder calculi.  Her urine culture at that time was negative for infection.  She was still having significant bladder symptoms, so she underwent cystoscopy with bilateral retrogrades on 06/06/2015 and no GU pathology was identified.    History of IC Previous PVR was 0 mL.    PMH: Past Medical History:  Diagnosis Date  . Anemia   . Anxiety   . Chronic kidney disease    UTI, hematuria in  urine  . Depression   . Diabetes ([place])   . Diverticulosis   . Frequent headaches   . Interstitial cystitis   . Recurrent UTI   . Restless leg syndrome   . Urinary frequency     Surgical History: Past Surgical History:  Procedure Laterality Date  . ABDOMINAL HYSTERECTOMY    . bariatric bypass  2012  . CARPAL TUNNEL RELEASE Right 2003  . CARPAL TUNNEL RELEASE Right    2008  . CHOLECYSTECTOMY  1975  . CYSTOSCOPY W/ RETROGRADES Bilateral 06/06/2015   Procedure: CYSTOSCOPY WITH RETROGRADE PYELOGRAM;  Surgeon: Festus Aloe, MD;  Location: [facility] ORS;  Service: Urology;  Laterality: Bilateral;  . FL INJ LEFT KNEE CT ARTHROGRAM ([facility] HX) Left    1995  . GASTRIC BYPASS  2010  . [place]  2013  . KNEE ARTHROSCOPY Left 1996  . TONSILLECTOMY      Home Medications:  Allergies as of 08/21/2018      Reactions   Avelox [moxifloxacin Hcl In Nacl] Anaphylaxis   Bactrim [sulfamethoxazole-trimethoprim] Anaphylaxis   Depakote [divalproex Sodium]    Imitrex [sumatriptan] Other (See Comments)   Neck and shoulder pain   Stadol [butorphanol] Rash      Medication List       Accurate as of August 21, 2018  1:06 PM. Always use your most recent med list.        ALPRAZolam 0.5 MG tablet Commonly known  as:  XANAX TAKE 1 TABLET BY MOUTH EVERY 6 HOURS AS NEEDED FOR ANXIETY   buPROPion 150 MG 24 hr tablet Commonly known as:  WELLBUTRIN XL Take 3 tablets (450 mg total) by mouth daily.   butalbital-aspirin-caffeine 50-325-40 MG capsule Commonly known as:  FIORINAL Take 1 capsule by mouth daily as needed for headache.   cephALEXin 500 MG capsule Commonly known as:  KEFLEX Take 1 capsule (500 mg total) by mouth 2 (two) times daily.   cephALEXin 500 MG capsule Commonly known as:  KEFLEX Take 1 capsule (500 mg total) by mouth 2 (two) times daily.   cyclobenzaprine 10 MG tablet Commonly known as:  FLEXERIL Take 1 tablet (10 mg total) by mouth 3 (three) times daily as needed  for muscle spasms.   cyclobenzaprine 10 MG tablet Commonly known as:  FLEXERIL Take 1 tablet (10 mg total) by mouth 3 (three) times daily as needed for muscle spasms.   estradiol 0.1 MG/GM vaginal cream Commonly known as:  ESTRACE VAGINAL Apply 0.63m (pea-sized amount)  just inside the vaginal introitus with a finger-tip on Monday, Wednesday and Friday nights.   fluticasone 50 MCG/ACT nasal spray Commonly known as:  FLONASE Place 2 sprays into the nose as needed.   furosemide 20 MG tablet Commonly known as:  LASIX Take 1 tablet (20 mg total) by mouth daily.   glipiZIDE 5 MG tablet Commonly known as:  GLUCOTROL Take 1 tablet (5 mg total) by mouth 2 (two) times daily before a meal.   lamoTRIgine 200 MG tablet Commonly known as:  LAMICTAL TAKE 2 TABLETS BY MOUTH AT BEDTIME   metFORMIN 1000 MG tablet Commonly known as:  GLUCOPHAGE TAKE 1 TABLET BY MOUTH TWO  TIMES DAILY WITH MEALS   naproxen 500 MG EC tablet Commonly known as:  NAPROXEN DR Take 1 tablet (500 mg total) 2 (two) times daily with a meal by mouth.   nitrofurantoin (macrocrystal-monohydrate) 100 MG capsule Commonly known as:  MACROBID Take 1 capsule (100 mg total) by mouth 2 (two) times daily.   omeprazole 20 MG tablet Commonly known as:  PRILOSEC OTC Take 1 tablet (20 mg total) by mouth daily.   ondansetron 4 MG tablet Commonly known as:  ZOFRAN Take 1 tablet (4 mg total) by mouth every 8 (eight) hours as needed for nausea or vomiting.   ONE TOUCH ULTRA TEST test strip Generic drug:  glucose blood daily.   pramipexole 0.5 MG tablet Commonly known as:  MIRAPEX Take 1 tablet (0.5 mg total) by mouth at bedtime.   risperiDONE 3 MG tablet Commonly known as:  RISPERDAL Take 3 mg by mouth at bedtime.   rizatriptan 10 MG tablet Commonly known as:  MAXALT Take 1 tablet (10 mg total) by mouth as needed for migraine. May repeat in 2 hours if needed   sertraline 100 MG tablet Commonly known as:  ZOLOFT Take  1 tablet (100 mg total) by mouth every morning.   URETRON D/S Tabs Take 1 tablet by mouth every 6 (six) hours as needed.   zolpidem 10 MG tablet Commonly known as:  AMBIEN TAKE 1/2 TO 1 (ONE-HALF TO ONE) TABLET BY MOUTH AT BEDTIME AS NEEDED FOR SLEEP. MAY FILL 03/06/18       Allergies:  Allergies  Allergen Reactions  . Avelox [Moxifloxacin Hcl In Nacl] Anaphylaxis  . Bactrim [Sulfamethoxazole-Trimethoprim] Anaphylaxis  . Depakote [Divalproex Sodium]   . Imitrex [Sumatriptan] Other (See Comments)    Neck and shoulder pain  .  Stadol [Butorphanol] Rash    Family History: Family History  Problem Relation Age of Onset  . Stroke Father   . Heart failure Sister   . Bladder Cancer Neg Hx   . Kidney disease Neg Hx   . Prostate cancer Neg Hx   . Kidney cancer Neg Hx     Social History:  reports that she quit smoking about 43 years ago. Her smoking use included cigarettes. She smoked 0.50 packs per day. She has never used smokeless tobacco. She reports that she does not drink alcohol or use drugs.  ROS: UROLOGY Frequent Urination?: Yes Hard to postpone urination?: No Burning/pain with urination?: No Get up at night to urinate?: Yes Leakage of urine?: No Urine stream starts and stops?: No Trouble starting stream?: No Do you have to strain to urinate?: No Blood in urine?: No Urinary tract infection?: No Sexually transmitted disease?: No Injury to kidneys or bladder?: No Painful intercourse?: No Weak stream?: No Currently pregnant?: No Vaginal bleeding?: No Last menstrual period?: n  Gastrointestinal Nausea?: Yes Vomiting?: No Indigestion/heartburn?: No Diarrhea?: No Constipation?: No  Constitutional Fever: No Night sweats?: No Weight loss?: No Fatigue?: No  Skin Skin rash/lesions?: No Itching?: No  Eyes Blurred vision?: No Double vision?: No  Ears/Nose/Throat Sore throat?: No Sinus problems?: No  Hematologic/Lymphatic Swollen glands?: No Easy  bruising?: No  Cardiovascular Leg swelling?: No Chest pain?: No  Respiratory Cough?: No Shortness of breath?: No  Endocrine Excessive thirst?: No  Musculoskeletal Back pain?: No Joint pain?: No  Neurological Headaches?: Yes Dizziness?: No  Psychologic Depression?: No Anxiety?: No  Physical Exam: BP 130/78   Pulse 99   Ht 5' 2"  (1.575 m)   Wt 143 lb (64.9 kg)   LMP  (LMP Unknown)   BMI 26.16 kg/m   Constitutional: Well nourished. Alert and oriented, No acute distress. Cardiovascular: No clubbing, cyanosis, or edema. Respiratory: Normal respiratory effort, no increased work of breathing. Neurologic: Grossly intact, no focal deficits, moving all 4 extremities. Psychiatric: Normal mood and affect.  Laboratory Data: Urinalysis Many bacteria.  See Epic.  I have reviewed the labs.  Pertinent imaging  Assessment & Plan:    1. rUTI's                           2. History of hematuria   3. History of IC   No follow-ups on file.  Zara Council, PA-C  [place] [address] [place], [place] [address] [phone]  I, [place], am acting as a Education administrator for [employer],  I have reviewed the above documentation for accuracy and completeness, and I agree with the above.    Zara Council, PA-C  Patient left after leaving an UA specimen, so appointment could not be completed.

## 2018-08-22 ENCOUNTER — Encounter: Payer: Self-pay | Admitting: Family Medicine

## 2018-08-22 ENCOUNTER — Ambulatory Visit (INDEPENDENT_AMBULATORY_CARE_PROVIDER_SITE_OTHER): Payer: Self-pay | Admitting: Family Medicine

## 2018-08-22 VITALS — BP 119/76 | HR 98 | Temp 98.6°F | Wt 146.2 lb

## 2018-08-22 DIAGNOSIS — E119 Type 2 diabetes mellitus without complications: Secondary | ICD-10-CM

## 2018-08-22 DIAGNOSIS — G43009 Migraine without aura, not intractable, without status migrainosus: Secondary | ICD-10-CM

## 2018-08-22 LAB — BAYER DCA HB A1C WAIVED: HB A1C (BAYER DCA - WAIVED): 6.7 % (ref <7.0)

## 2018-08-22 LAB — URINALYSIS, COMPLETE
Bilirubin, UA: NEGATIVE
GLUCOSE, UA: NEGATIVE
Ketones, UA: NEGATIVE
Nitrite, UA: NEGATIVE
Protein, UA: NEGATIVE
RBC, UA: NEGATIVE
Specific Gravity, UA: 1.01 (ref 1.005–1.030)
Urobilinogen, Ur: 0.2 mg/dL (ref 0.2–1.0)
pH, UA: 5.5 (ref 5.0–7.5)

## 2018-08-22 LAB — MICROSCOPIC EXAMINATION
Epithelial Cells (non renal): NONE SEEN /hpf (ref 0–10)
RBC, UA: NONE SEEN /hpf (ref 0–2)

## 2018-08-22 MED ORDER — RIZATRIPTAN BENZOATE 10 MG PO TABS: 10.0000 mg | ORAL_TABLET | ORAL | 0 refills | Status: DC | PRN

## 2018-08-22 MED ORDER — PIOGLITAZONE HCL 30 MG PO TABS: 30.0000 mg | ORAL_TABLET | Freq: Every day | ORAL | 2 refills | Status: DC

## 2018-08-22 NOTE — Progress Notes (Signed)
BP 119/76   Pulse 98   Temp 98.6 F (37 C) (Oral)   Wt 146 lb 3.2 oz (66.3 kg)   LMP  (LMP Unknown)   SpO2 98%   BMI 26.74 kg/m    Subjective:    Patient ID: Amy Hansen, female    DOB: 1955/09/25, 63 y.o.   MRN: [id number]  HPI: Amy Hansen is a 63 y.o. female  Chief Complaint  Patient presents with  . Diabetes    3 month f/up- pt states last eye exam in chart is correct    Here today for 3 month DM follow up. Fasting sugars running about 200 when checked intermittently. Was given an insulin sample 3 months ago but used that up and did not start the bydureon due to being self pay. Still taking metformin and glipizide daily. Has cleaned up her diet significantly since the last visit when her A1C was up so much. Denies hypoglycemic episodes.   Used up her fioricet, wanting to see about getting more. States the maxalt was too expensive so she never got it.   Past Medical History:  Diagnosis Date  . Anemia   . Anxiety   . Chronic kidney disease    UTI, hematuria in urine  . Depression   . Diabetes ([place])   . Diverticulosis   . Frequent headaches   . Interstitial cystitis   . Recurrent UTI   . Restless leg syndrome   . Urinary frequency    Social History   Socioeconomic History  . Marital status: Married    Spouse name: Not on file  . Number of children: Not on file  . Years of education: Not on file  . Highest education level: Not on file  Occupational History  . Not on file  Social Needs  . Financial resource strain: Not on file  . Food insecurity:    Worry: Not on file    Inability: Not on file  . Transportation needs:    Medical: Not on file    Non-medical: Not on file  Tobacco Use  . Smoking status: Former Smoker    Packs/day: 0.50    Types: Cigarettes    Last attempt to quit: 04/25/1975    Years since quitting: 43.3  . Smokeless tobacco: Never Used  . Tobacco comment: quit 40 years  Substance and Sexual Activity  . Alcohol use: No   Alcohol/week: 0.0 standard drinks  . Drug use: No  . Sexual activity: Not Currently    Birth control/protection: Post-menopausal  Lifestyle  . Physical activity:    Days per week: Not on file    Minutes per session: Not on file  . Stress: Not on file  Relationships  . Social connections:    Talks on phone: Not on file    Gets together: Not on file    Attends religious service: Not on file    Active member of club or organization: Not on file    Attends meetings of clubs or organizations: Not on file    Relationship status: Not on file  . Intimate partner violence:    Fear of current or ex partner: Not on file    Emotionally abused: Not on file    Physically abused: Not on file    Forced sexual activity: Not on file  Other Topics Concern  . Not on file  Social History Narrative   Caffeine 5 servings per day.    Relevant past medical, surgical, family  and social history reviewed and updated as indicated. Interim medical history since our last visit reviewed. Allergies and medications reviewed and updated.  Review of Systems  Per HPI unless specifically indicated above     Objective:    BP 119/76   Pulse 98   Temp 98.6 F (37 C) (Oral)   Wt 146 lb 3.2 oz (66.3 kg)   LMP  (LMP Unknown)   SpO2 98%   BMI 26.74 kg/m   Wt Readings from Last 3 Encounters:  08/22/18 146 lb 3.2 oz (66.3 kg)  08/21/18 143 lb (64.9 kg)  08/06/18 143 lb (64.9 kg)    Physical Exam Vitals signs and nursing note reviewed.  Constitutional:      Appearance: Normal appearance. She is not ill-appearing.  HENT:     Head: Atraumatic.  Eyes:     Extraocular Movements: Extraocular movements intact.     Conjunctiva/sclera: Conjunctivae normal.  Neck:     Musculoskeletal: Normal range of motion and neck supple.  Cardiovascular:     Rate and Rhythm: Normal rate and regular rhythm.     Heart sounds: Normal heart sounds.  Pulmonary:     Effort: Pulmonary effort is normal.     Breath sounds:  Normal breath sounds.  Musculoskeletal: Normal range of motion.  Skin:    General: Skin is warm and dry.  Neurological:     Mental Status: She is alert and oriented to person, place, and time.  Psychiatric:        Mood and Affect: Mood normal.        Thought Content: Thought content normal.        Judgment: Judgment normal.     Results for orders placed or performed in visit on 16/10/96  Basic Metabolic Panel (BMET)  Result Value Ref Range   Glucose 140 (H) 65 - 99 mg/dL   BUN 9 8 - 27 mg/dL   Creatinine, Ser 0.98 0.57 - 1.00 mg/dL   GFR calc non Af Amer 62 >59 mL/min/1.73   GFR calc Af Amer 72 >59 mL/min/1.73   BUN/Creatinine Ratio 9 (L) 12 - 28   Sodium 135 134 - 144 mmol/L   Potassium 4.1 3.5 - 5.2 mmol/L   Chloride 98 96 - 106 mmol/L   CO2 21 20 - 29 mmol/L   Calcium 9.1 8.7 - 10.3 mg/dL  Bayer DCA Hb A1c Waived  Result Value Ref Range   HB A1C (BAYER DCA - WAIVED) 6.7 <7.0 %      Assessment & Plan:   Problem List Items Addressed This Visit      Cardiovascular and Mediastinum   Migraines    Will not refill fioricet, pt aware that the 15 tabs was to last the year. Coupon given for maxalt, she will try this.       Relevant Medications   rizatriptan (MAXALT) 10 MG tablet    Other Visit Diagnoses    Type 2 diabetes mellitus without complication, without long-term current use of insulin (HCC)    -  Primary   Repeat A1C 6.7, continue metformin and glipizide as well as good diet and exercise. Recheck in 3 months    Relevant Orders   Basic Metabolic Panel (BMET) (Completed)   Bayer DCA Hb A1c Waived (Completed)       Follow up plan: Return in about 3 months (around 11/21/2018) for DM.

## 2018-08-23 LAB — BASIC METABOLIC PANEL
BUN / CREAT RATIO: 9 — AB (ref 12–28)
BUN: 9 mg/dL (ref 8–27)
CO2: 21 mmol/L (ref 20–29)
Calcium: 9.1 mg/dL (ref 8.7–10.3)
Chloride: 98 mmol/L (ref 96–106)
Creatinine, Ser: 0.98 mg/dL (ref 0.57–1.00)
GFR calc Af Amer: 72 mL/min/{1.73_m2} (ref >59)
GFR calc non Af Amer: 62 mL/min/{1.73_m2} (ref >59)
Glucose: 140 mg/dL — ABNORMAL HIGH (ref 65–99)
Potassium: 4.1 mmol/L (ref 3.5–5.2)
Sodium: 135 mmol/L (ref 134–144)

## 2018-08-24 NOTE — Assessment & Plan Note (Signed)
Will not refill fioricet, pt aware that the 15 tabs was to last the year. Coupon given for maxalt, she will try this.

## 2018-08-25 ENCOUNTER — Telehealth: Payer: Self-pay

## 2018-08-25 MED ORDER — FLUCONAZOLE 150 MG PO TABS: 150.0000 mg | ORAL_TABLET | Freq: Once | ORAL | 0 refills | Status: AC

## 2018-08-25 NOTE — Telephone Encounter (Signed)
-----   Message from Nori Riis, PA-C sent at 08/21/2018  4:39 PM EST ----- Please let Mrs. Helbig know that her urine was positive for yeast.  We will send in a script for Diflucan for her.

## 2018-08-27 ENCOUNTER — Other Ambulatory Visit: Payer: Self-pay | Admitting: Family Medicine

## 2018-08-27 NOTE — Telephone Encounter (Signed)
Copied from [place] [id number]. Topic: Quick Communication - Rx Refill/Question >> Aug 27, 2018  3:50 PM Rayann Heman wrote: Medication: cyclobenzaprine (FLEXERIL) 10 MG tablet [[id number]]   Has the patient contacted their pharmacy?no Preferred Pharmacy (with phone number or street name): [facility] [address] [place] - [place] [phone] (Phone) [phone] (Fax)  Agent: Please be advised that RX refills may take up to 3 business days. We ask that you follow-up with your pharmacy.

## 2018-08-27 NOTE — Telephone Encounter (Signed)
Requested medication (s) are due for refill today -yes  Requested medication (s) are on the active medication list -yes  Future visit scheduled -yes  Last refill: 07/11/18  Notes to clinic: Patient is requesting refill of non delegated medication. Request sent for review  Requested Prescriptions  Pending Prescriptions Disp Refills   cyclobenzaprine (FLEXERIL) 10 MG tablet 30 tablet 0    Sig: Take 1 tablet (10 mg total) by mouth 3 (three) times daily as needed for muscle spasms.     Not Delegated - Analgesics:  Muscle Relaxants Failed - 08/27/2018  3:54 PM      Failed - This refill cannot be delegated      Passed - Valid encounter within last 6 months    Recent Outpatient Visits          5 days ago Type 2 diabetes mellitus without complication, without long-term current use of insulin [facility])   [facility], [place], [place]   1 month ago Migraine without aura and without status migrainosus, not intractable   [facility] [place], [place]   3 months ago Lower urinary tract symptoms (LUTS)   [facility], [place], [place]   4 months ago Controlled type 2 diabetes mellitus without complication, without long-term current use of insulin ([address])   [facility] Kathrine Haddock, NP   4 months ago Left hip pain   [place], [name], [place]      Future Appointments            In 2 months [facility] Desanctis, [name], PA-C [place], [facility]            Requested Prescriptions  Pending Prescriptions Disp Refills   cyclobenzaprine (FLEXERIL) 10 MG tablet 30 tablet 0    Sig: Take 1 tablet (10 mg total) by mouth 3 (three) times daily as needed for muscle spasms.     Not Delegated - Analgesics:  Muscle Relaxants Failed - 08/27/2018  3:54 PM      Failed - This refill cannot be delegated      Passed - Valid encounter within last 6 months    Recent Outpatient Visits          5 days ago Type 2 diabetes mellitus without complication, without long-term current use of insulin [facility])   [facility], [place], [place]   1 month ago Migraine without aura and without status migrainosus, not intractable   [facility] Mer[place], [place]   3 months ago Lower urinary tract symptoms (LUTS)   [place], [place], [place]   4 months ago Controlled type 2 diabetes mellitus without complication, without long-term current use of insulin ([place])   [facility] Kathrine Haddock, NP   4 months ago Left hip pain   [place], [name], [place]      Future Appointments            In 2 months Orene Desanctis, [name], [place], [facility]

## 2018-08-28 ENCOUNTER — Other Ambulatory Visit: Payer: Self-pay | Admitting: Physician Assistant

## 2018-08-28 MED ORDER — CYCLOBENZAPRINE HCL 10 MG PO TABS: 10.0000 mg | ORAL_TABLET | Freq: Three times a day (TID) | ORAL | 0 refills | Status: DC | PRN

## 2018-08-29 ENCOUNTER — Other Ambulatory Visit: Payer: Self-pay | Admitting: Physician Assistant

## 2018-08-29 ENCOUNTER — Telehealth: Payer: Self-pay | Admitting: Urology

## 2018-08-29 MED ORDER — ONDANSETRON HCL 4 MG PO TABS: 4.0000 mg | ORAL_TABLET | Freq: Three times a day (TID) | ORAL | 0 refills | Status: DC | PRN

## 2018-08-29 NOTE — Telephone Encounter (Signed)
Patient is requesting ondansetron 4 mg and rizatriptan 26m  to be sent to [facility] would not do so for the patient.    Thank you

## 2018-08-29 NOTE — Telephone Encounter (Signed)
Will need appt for urine testing, and should probably try to get in with her Urologist as they are managing her recurrent UTIs.   Her rizatriptan is already at [facility], just re sent the zofran to [facility] as well

## 2018-08-29 NOTE — Telephone Encounter (Signed)
Pt called and states that she thinks that she has a UTI and wants to know if she can just come and drop off a urine. She states that she does not want to see a nurse.Please advise. Thank You/

## 2018-08-31 NOTE — Telephone Encounter (Signed)
Only allowed 30 DAY on CONTROLLED substances per provider

## 2018-09-01 ENCOUNTER — Encounter: Payer: Self-pay | Admitting: Family Medicine

## 2018-09-01 ENCOUNTER — Ambulatory Visit (INDEPENDENT_AMBULATORY_CARE_PROVIDER_SITE_OTHER): Payer: Self-pay | Admitting: Family Medicine

## 2018-09-01 VITALS — BP 116/83 | HR 98 | Ht 62.0 in | Wt 144.9 lb

## 2018-09-01 DIAGNOSIS — N390 Urinary tract infection, site not specified: Secondary | ICD-10-CM

## 2018-09-01 LAB — URINALYSIS, COMPLETE
Bilirubin, UA: NEGATIVE
Glucose, UA: NEGATIVE
Ketones, UA: NEGATIVE
Nitrite, UA: POSITIVE — AB
Protein, UA: NEGATIVE
Specific Gravity, UA: 1.015 (ref 1.005–1.030)
Urobilinogen, Ur: 0.2 mg/dL (ref 0.2–1.0)
pH, UA: 7 (ref 5.0–7.5)

## 2018-09-01 LAB — MICROSCOPIC EXAMINATION: RBC MICROSCOPIC, UA: NONE SEEN /HPF (ref 0–2)

## 2018-09-01 MED ORDER — CEPHALEXIN 500 MG PO CAPS: 500.0000 mg | ORAL_CAPSULE | Freq: Three times a day (TID) | ORAL | 0 refills | Status: DC

## 2018-09-01 NOTE — Progress Notes (Signed)
Patient presents today with urinary frequency and dysuria. Patient states she has not been on ABX or had any Urological surgeries in the last 30 days. A urine was collected today for UA, UCX. Dr. Diamantina Providence reviewed the UA in office today and sent Keflex 500 TID to pharmacy for 5 days. We will call with UCX results.

## 2018-09-01 NOTE — Telephone Encounter (Signed)
Patient walked into the office today.  Added to the nurse schedule.

## 2018-09-01 NOTE — Addendum Note (Signed)
Addended by: Kyra Manges on: 09/01/2018 03:43 PM   Modules accepted: Level of Service

## 2018-09-04 ENCOUNTER — Telehealth: Payer: Self-pay

## 2018-09-04 DIAGNOSIS — N390 Urinary tract infection, site not specified: Secondary | ICD-10-CM

## 2018-09-04 DIAGNOSIS — B9620 Unspecified Escherichia coli [E. coli] as the cause of diseases classified elsewhere: Secondary | ICD-10-CM

## 2018-09-04 LAB — CULTURE, URINE COMPREHENSIVE

## 2018-09-04 MED ORDER — CEFPODOXIME PROXETIL 100 MG PO TABS: 100.0000 mg | ORAL_TABLET | Freq: Two times a day (BID) | ORAL | 0 refills | Status: AC

## 2018-09-04 NOTE — Telephone Encounter (Signed)
Called pt informed her of the information below. Pt gave verbal understanding. RX sent in.

## 2018-09-04 NOTE — Telephone Encounter (Signed)
-----   Message from Billey Co, MD sent at 09/04/2018  5:13 PM EST ----- UTI with multiple resistances, stop keflex and start cefpodoxime 164m BID x 5 days.  BNickolas Madrid MD 09/04/2018

## 2018-09-16 ENCOUNTER — Encounter: Payer: Self-pay | Admitting: Urology

## 2018-09-16 ENCOUNTER — Ambulatory Visit (INDEPENDENT_AMBULATORY_CARE_PROVIDER_SITE_OTHER): Payer: BLUE CROSS/BLUE SHIELD | Admitting: Urology

## 2018-09-16 VITALS — BP 114/72 | HR 90 | Ht 62.0 in | Wt 147.8 lb

## 2018-09-16 DIAGNOSIS — N390 Urinary tract infection, site not specified: Secondary | ICD-10-CM | POA: Diagnosis not present

## 2018-09-16 DIAGNOSIS — Z87440 Personal history of urinary (tract) infections: Secondary | ICD-10-CM | POA: Diagnosis not present

## 2018-09-16 DIAGNOSIS — Z87448 Personal history of other diseases of urinary system: Secondary | ICD-10-CM | POA: Diagnosis not present

## 2018-09-16 LAB — URINALYSIS, COMPLETE
Bilirubin, UA: NEGATIVE
Glucose, UA: NEGATIVE
Ketones, UA: NEGATIVE
LEUKOCYTES UA: NEGATIVE
NITRITE UA: POSITIVE — AB
Protein, UA: NEGATIVE
RBC, UA: NEGATIVE
Specific Gravity, UA: 1.015 (ref 1.005–1.030)
Urobilinogen, Ur: 0.2 mg/dL (ref 0.2–1.0)
pH, UA: 6 (ref 5.0–7.5)

## 2018-09-16 NOTE — Progress Notes (Signed)
09/16/2018 11:41 AM   Amy Hansen May 08, 1956 [id number]  Referring provider: Volney American, PA-C [address] [place], [place] [address]  Chief Complaint  Patient presents with  . Recurrent UTI    HPI: Amy Hansen is a 64 y.o. female Caucasian with a history of hematuria, [name]   On 08/21/2018, patient left without completing treatment.  Patient has lost insurance due to unemployment.  rUTI's She was on 08/06/2018 complaining of right lower back pain, started 500 mg Keflex on yesterday, 08/05/2018 with 3 doses so far.  She was worried about her kidney function.  She reported associated hematuria, chills, and diarrhea.  She noticed blood on the tissue when she wiped.  She consumes diet ginger ale daily with a small amount of water.  She was not currently taking a probiotic, however, she used to take probiotics, but they caused her to have an upset stomach.  She doesn't consume diet Dr. Malachi Bonds or tea.  She was not sexually active and she doesn't soak in tubs. She denied fever, constipation, and any other symptoms.  She didn't drink enough water per day.  She has had a hysterectomy.  On 08/21/2018, she reported improvement in symptoms starting the previous week. She reported nausea and her urine was cloudy. She denied burning with urination, gross hematuria, flank pain and a little pain in the suprapubic area.   Her UA on 08/21/2018 was positive for 0-5 WBCs and many bacteria.   Today (09/16/2018), her UA is negative, with a false positive for nitrates due to taking Azo.  Patient admits dysuria and 'horrorible' odor.  Patient admits burning.  Patient says she just finished an antibiotic last week, but does not feel she got any relief.  Patient says she is very tired of this.  Patient admits positional lower back pain and nocturia.  Patient says this has been going on for months.   Patient says she has been taking Azo for the past few days.  Patient reports using the vaginal cream.  Patient says she does not feel she is drinking enough water.  History of hematuria She was seen in [facility]  ED on 05/22/2015 for gross hematuria and had a non-contrast CT that demonstrated no renal, ureteral or bladder calculi.  Her urine culture at that time was negative for infection.  She was still having significant bladder symptoms, so she underwent cystoscopy with bilateral retrogrades on 06/06/2015 and no GU pathology was identified.    History of IC Previous PVR was 0 mL.    PMH: Past Medical History:  Diagnosis Date  . Anemia   . Anxiety   . Chronic kidney disease    UTI, hematuria in urine  . Depression   . Diabetes ([place])   . Diverticulosis   . Frequent headaches   . Interstitial cystitis   . Recurrent UTI   . Restless leg syndrome   . Urinary frequency     Surgical History: Past Surgical History:  Procedure Laterality Date  . ABDOMINAL HYSTERECTOMY    . bariatric bypass  2012  . CARPAL TUNNEL RELEASE Right 2003  . CARPAL TUNNEL RELEASE Right    2008  . CHOLECYSTECTOMY  1975  . CYSTOSCOPY W/ RETROGRADES Bilateral 06/06/2015   Procedure: CYSTOSCOPY WITH RETROGRADE PYELOGRAM;  Surgeon: Festus Aloe, MD;  Location: [facility] ORS;  Service: Urology;  Laterality: Bilateral;  . [place]  INJ LEFT KNEE CT ARTHROGRAM ([facility] HX) Left    1995  . GASTRIC BYPASS  2010  . [place]  2013  . KNEE ARTHROSCOPY Left 1996  . TONSILLECTOMY      Home Medications:  Allergies as of 09/16/2018      Reactions   Avelox [moxifloxacin Hcl In Nacl] Anaphylaxis   Bactrim [sulfamethoxazole-trimethoprim] Anaphylaxis   Depakote [divalproex Sodium]    Imitrex [sumatriptan] Other (See Comments)   Neck and shoulder pain   Stadol [butorphanol] Rash      Medication List       Accurate as of September 16, 2018 11:41 AM. Always use your most recent med list.        ALPRAZolam 0.5 MG  tablet Commonly known as:  XANAX TAKE 1 TABLET BY MOUTH EVERY 6 HOURS AS NEEDED FOR ANXIETY   buPROPion 150 MG 24 hr tablet Commonly known as:  WELLBUTRIN XL Take 3 tablets (450 mg total) by mouth daily.   butalbital-aspirin-caffeine 50-325-40 MG capsule Commonly known as:  FIORINAL Take 1 capsule by mouth daily as needed for headache.   cyclobenzaprine 10 MG tablet Commonly known as:  FLEXERIL Take 1 tablet (10 mg total) by mouth 3 (three) times daily as needed for muscle spasms.   estradiol 0.1 MG/GM vaginal cream Commonly known as:  ESTRACE VAGINAL Apply 0.5m (pea-sized amount)  just inside the vaginal introitus with a finger-tip on Monday, Wednesday and Friday nights.   fluticasone 50 MCG/ACT nasal spray Commonly known as:  FLONASE Place 2 sprays into the nose as needed.   furosemide 20 MG tablet Commonly known as:  LASIX Take 1 tablet (20 mg total) by mouth daily.   glipiZIDE 5 MG tablet Commonly known as:  GLUCOTROL Take 1 tablet (5 mg total) by mouth 2 (two) times daily before a meal.   lamoTRIgine 200 MG tablet Commonly known as:  LAMICTAL TAKE 2 TABLETS BY MOUTH AT BEDTIME   metFORMIN 1000 MG tablet Commonly known as:  GLUCOPHAGE TAKE 1 TABLET BY MOUTH TWO  TIMES DAILY WITH MEALS   naproxen 500 MG EC tablet Commonly known as:  NAPROXEN DR Take 1 tablet (500 mg total) 2 (two) times daily with a meal by mouth.   omeprazole 20 MG tablet Commonly known as:  PRILOSEC OTC Take 1 tablet (20 mg total) by mouth daily.   ondansetron 4 MG tablet Commonly known as:  ZOFRAN Take 1 tablet (4 mg total) by mouth every 8 (eight) hours as needed for nausea or vomiting.   ONE TOUCH ULTRA TEST test strip Generic drug:  glucose blood daily.   pramipexole 0.5 MG tablet Commonly known as:  MIRAPEX Take 1 tablet (0.5 mg total) by mouth at bedtime.   risperiDONE 3 MG tablet Commonly known as:  RISPERDAL Take 3 mg by mouth at bedtime.   rizatriptan 10 MG  tablet Commonly known as:  MAXALT Take 1 tablet (10 mg total) by mouth as needed for migraine. May repeat in 2 hours if needed   sertraline 100 MG tablet Commonly known as:  ZOLOFT Take 1 tablet (100 mg total) by mouth every morning.   zolpidem 10 MG tablet Commonly known as:  AMBIEN TAKE 1/2 TO 1 (ONE-HALF TO ONE) TABLET BY MOUTH AT BEDTIME AS NEEDED FOR SLEEP       Allergies:  Allergies  Allergen Reactions  . Avelox [Moxifloxacin Hcl In Nacl] Anaphylaxis  . Bactrim [Sulfamethoxazole-Trimethoprim] Anaphylaxis  . Depakote [Divalproex Sodium]   . Imitrex [Sumatriptan]  Other (See Comments)    Neck and shoulder pain  . Stadol [Butorphanol] Rash    Family History: Family History  Problem Relation Age of Onset  . Stroke Father   . Heart failure Sister   . Bladder Cancer Neg Hx   . Kidney disease Neg Hx   . Prostate cancer Neg Hx   . Kidney cancer Neg Hx     Social History:  reports that she quit smoking about 43 years ago. Her smoking use included cigarettes. She smoked 0.50 packs per day. She has never used smokeless tobacco. She reports that she does not drink alcohol or use drugs.  ROS: UROLOGY Frequent Urination?: Yes Hard to postpone urination?: Yes Burning/pain with urination?: Yes Get up at night to urinate?: Yes Leakage of urine?: No Urine stream starts and stops?: No Trouble starting stream?: No Do you have to strain to urinate?: No Blood in urine?: No Urinary tract infection?: Yes Sexually transmitted disease?: No Injury to kidneys or bladder?: No Painful intercourse?: No Weak stream?: No Currently pregnant?: No Vaginal bleeding?: No Last menstrual period?: n  Gastrointestinal Nausea?: Yes Vomiting?: No Indigestion/heartburn?: No Diarrhea?: No Constipation?: No  Constitutional Fever: No Night sweats?: No Weight loss?: No Fatigue?: Yes  Skin Skin rash/lesions?: No Itching?: No  Eyes Blurred vision?: No Double vision?:  No  Ears/Nose/Throat Sore throat?: No Sinus problems?: No  Hematologic/Lymphatic Swollen glands?: No Easy bruising?: No  Cardiovascular Leg swelling?: No Chest pain?: No  Respiratory Cough?: No Shortness of breath?: No  Endocrine Excessive thirst?: No  Musculoskeletal Back pain?: Yes Joint pain?: No  Neurological Headaches?: Yes Dizziness?: No  Psychologic Depression?: Yes Anxiety?: Yes  Physical Exam: BP 114/72 (BP Location: Left Arm, Patient Position: Sitting, Cuff Size: Normal)   Pulse 90   Ht 5' 2"  (1.575 m)   Wt 147 lb 12.8 oz (67 kg)   LMP  (LMP Unknown)   BMI 27.03 kg/m   Constitutional: Well nourished. Alert and oriented, No acute distress. Cardiovascular: No clubbing, cyanosis, or edema. Respiratory: Normal respiratory effort, no increased work of breathing. GU: Bladder is tender.  No bladder fullness or masses.  Normal external genitalia, sparce pubic hair distribution, no lesions.  Normal urethral meatus, no lesions, no prolapse, no discharge.   No urethral masses, tenderness and/or tenderness. No bladder fullness, tenderness or masses. pink vagina mucosa, good estrogen effect, no discharge, no lesions, fair pelvic support, grade 1 cystocele and no rectocele noted.     Anus and perineum are without rashes or lesions. Skin: No rashes, bruises or suspicious lesions. Neurologic: Grossly intact, no focal deficits, moving all 4 extremities. Psychiatric: Normal mood and affect.   Laboratory Data:  Urinalysis Negative; NIT is a false positive due to being on Azo.  See Epic.   I have reviewed the labs.  Assessment & Plan:    1. rUTI's - continue vaginal estrogen cream - have patient scheduled for a mammogram and pelvic exam through the [facility] program - CATH UA is negative - will send for culture as patient is symptomatic - will hold on prescribing an antibiotic at this time until culture results are available as patient has a history of rUTI's with  multiresistant organsims -follow up based on urine culture results   2. History of hematuria - CATH UA is negative for RBC's  3. History of IC - see above   Return for pending urine culture .  Zara Council, [place] [address]  [place], [place] [address] [phone]  I, Adele Schilder, am acting as a Education administrator for [employer], PA-C.   I have reviewed the above documentation for accuracy and completeness, and I agree with the above.    Zara Council, PA-C

## 2018-09-16 NOTE — Progress Notes (Signed)
In and Out Catheterization  Patient is present today for a I & O catheterization due to recurrent UTI. Patient was cleaned and prepped in a sterile fashion with betadine and Lidocaine 2% jelly was instilled into the urethra.  A 14FR cath was inserted no complications were noted , 247m of urine return was noted, urine was orange in color. A clean urine sample was collected for UA,. Bladder was drained  And catheter was removed with out difficulty.    Preformed by: CElberta Leatherwood CMA

## 2018-09-19 ENCOUNTER — Encounter: Payer: Self-pay | Admitting: Family Medicine

## 2018-09-19 ENCOUNTER — Ambulatory Visit (INDEPENDENT_AMBULATORY_CARE_PROVIDER_SITE_OTHER): Payer: BLUE CROSS/BLUE SHIELD | Admitting: Family Medicine

## 2018-09-19 VITALS — BP 163/88 | HR 80 | Temp 97.8°F | Wt 154.7 lb

## 2018-09-19 DIAGNOSIS — G43009 Migraine without aura, not intractable, without status migrainosus: Secondary | ICD-10-CM

## 2018-09-19 LAB — CULTURE, URINE COMPREHENSIVE

## 2018-09-19 MED ORDER — PROMETHAZINE HCL 25 MG/ML IJ SOLN
25.0000 mg | Freq: Once | INTRAMUSCULAR | Status: AC
  Administered 2018-09-19: 25 mg via INTRAMUSCULAR

## 2018-09-19 MED ORDER — GALCANEZUMAB-GNLM 120 MG/ML ~~LOC~~ SOAJ: 1.0000 "pen " | SUBCUTANEOUS | 1 refills | Status: DC

## 2018-09-19 MED ORDER — KETOROLAC TROMETHAMINE 60 MG/2ML IM SOLN
60.0000 mg | Freq: Once | INTRAMUSCULAR | Status: AC
  Administered 2018-09-19: 60 mg via INTRAMUSCULAR

## 2018-09-19 NOTE — Progress Notes (Signed)
BP (!) 163/88   Pulse 80   Temp 97.8 F (36.6 C) (Oral)   Wt 154 lb 11.2 oz (70.2 kg)   LMP  (LMP Unknown)   SpO2 97%   BMI 28.30 kg/m    Subjective:    Patient ID: Amy Hansen, female    DOB: 11-27-1955, 63 y.o.   MRN: [id number]  HPI: Amy Hansen is a 63 y.o. female  Chief Complaint  Patient presents with  . Migraine    since this morning    Here today with a migraine that started this morning. N/V, right sided temple pain, fatigue. Could not tolerate maxalt or imitrex and has been out of fioricet. Has had issues with prophylactic migraine medications tried in the past as well and not interested in trying again. Taking OTC pain relievers without relief.   Relevant past medical, surgical, family and social history reviewed and updated as indicated. Interim medical history since our last visit reviewed. Allergies and medications reviewed and updated.  Review of Systems  Per HPI unless specifically indicated above     Objective:    BP (!) 163/88   Pulse 80   Temp 97.8 F (36.6 C) (Oral)   Wt 154 lb 11.2 oz (70.2 kg)   LMP  (LMP Unknown)   SpO2 97%   BMI 28.30 kg/m   Wt Readings from Last 3 Encounters:  09/19/18 154 lb 11.2 oz (70.2 kg)  09/16/18 147 lb 12.8 oz (67 kg)  09/01/18 144 lb 14.4 oz (65.7 kg)    Physical Exam Vitals signs and nursing note reviewed.  Constitutional:      Appearance: Normal appearance. She is not ill-appearing.  HENT:     Head: Atraumatic.  Eyes:     Extraocular Movements: Extraocular movements intact.     Conjunctiva/sclera: Conjunctivae normal.  Neck:     Musculoskeletal: Normal range of motion and neck supple.  Cardiovascular:     Rate and Rhythm: Normal rate and regular rhythm.     Heart sounds: Normal heart sounds.  Pulmonary:     Effort: Pulmonary effort is normal.     Breath sounds: Normal breath sounds.  Musculoskeletal: Normal range of motion.  Skin:    General: Skin is warm and dry.  Neurological:   Mental Status: She is alert and oriented to person, place, and time.  Psychiatric:        Mood and Affect: Mood normal.        Thought Content: Thought content normal.        Judgment: Judgment normal.     Results for orders placed or performed in visit on 09/16/18  CULTURE, URINE COMPREHENSIVE  Result Value Ref Range   Urine Culture, Comprehensive Final report (A)    Organism ID, Bacteria Comment (A)    ANTIMICROBIAL SUSCEPTIBILITY Comment   Urinalysis, Complete  Result Value Ref Range   Specific Gravity, UA 1.015 1.005 - 1.030   pH, UA 6.0 5.0 - 7.5   Color, UA Yellow Yellow   Appearance Ur Clear Clear   Leukocytes, UA Negative Negative   Protein, UA Negative Negative/Trace   Glucose, UA Negative Negative   Ketones, UA Negative Negative   RBC, UA Negative Negative   Bilirubin, UA Negative Negative   Urobilinogen, Ur 0.2 0.2 - 1.0 mg/dL   Nitrite, UA Positive (A) Negative      Assessment & Plan:   Problem List Items Addressed This Visit      Cardiovascular and Mediastinum  Migraines - Primary    Has tried and failed multiple migraine treatments in the past, fiorinal seems to be the only thing that has helped her. Will trial sample of emgality and monitor for benefit.       Relevant Medications   promethazine (PHENERGAN) injection 25 mg (Completed)   ketorolac (TORADOL) injection 60 mg (Completed)   Galcanezumab-gnlm (EMGALITY) 120 MG/ML SOAJ       Follow up plan: Return for as scheduled.

## 2018-09-20 NOTE — Assessment & Plan Note (Signed)
Has tried and failed multiple migraine treatments in the past, fiorinal seems to be the only thing that has helped her. Will trial sample of emgality and monitor for benefit.

## 2018-09-23 ENCOUNTER — Telehealth: Payer: Self-pay | Admitting: Urology

## 2018-09-23 NOTE — Telephone Encounter (Signed)
Pt called and requests a call back for urine results from last week. Please advise.

## 2018-09-24 NOTE — Telephone Encounter (Signed)
When trying to send in Cipro in to the pharmacy a contraindication for Avelox - anaphylaxis. Due to this Per Dr. Diamantina Providence patient should not be treated with Cipro and due to allergy and low colony count we will not treat at this time. Patient was called and notified of this and verbalized understanding will disregard mychart message sent previously

## 2018-10-03 ENCOUNTER — Other Ambulatory Visit: Payer: Self-pay | Admitting: Family Medicine

## 2018-10-03 DIAGNOSIS — Z1231 Encounter for screening mammogram for malignant neoplasm of breast: Secondary | ICD-10-CM

## 2018-10-06 ENCOUNTER — Encounter: Payer: Self-pay | Admitting: Urology

## 2018-10-06 ENCOUNTER — Ambulatory Visit (INDEPENDENT_AMBULATORY_CARE_PROVIDER_SITE_OTHER): Payer: BLUE CROSS/BLUE SHIELD | Admitting: Urology

## 2018-10-06 ENCOUNTER — Ambulatory Visit: Payer: BLUE CROSS/BLUE SHIELD

## 2018-10-06 VITALS — BP 125/80 | HR 105 | Ht 62.0 in | Wt 148.0 lb

## 2018-10-06 DIAGNOSIS — N390 Urinary tract infection, site not specified: Secondary | ICD-10-CM

## 2018-10-06 LAB — URINALYSIS, COMPLETE
Bilirubin, UA: NEGATIVE
Glucose, UA: NEGATIVE
Ketones, UA: NEGATIVE
NITRITE UA: NEGATIVE
Protein, UA: NEGATIVE
RBC, UA: NEGATIVE
Specific Gravity, UA: 1.01 (ref 1.005–1.030)
Urobilinogen, Ur: 0.2 mg/dL (ref 0.2–1.0)
pH, UA: 6 (ref 5.0–7.5)

## 2018-10-06 LAB — MICROSCOPIC EXAMINATION: RBC, UA: NONE SEEN /hpf (ref 0–2)

## 2018-10-06 MED ORDER — NITROFURANTOIN MACROCRYSTAL 100 MG PO CAPS: 100.0000 mg | ORAL_CAPSULE | Freq: Every day | ORAL | 11 refills | Status: DC

## 2018-10-06 NOTE — Progress Notes (Signed)
10/06/2018 2:28 PM   Amy Hansen  09/26/1955 [id number]  Referring provider: Volney American, PA-C [address] [place], [place] [address]  Chief Complaint  Patient presents with  . Recurrent UTI    HPI: Amy Hansen is a 63 y.o. female Caucasian with a history of hematuria, [name]   When seen in February by nurse practitioner she had cystitis symptoms.  It appears in 2016 she was evaluated with retrogrades and cystoscopy and a nonenhanced CT scan that was negative.  She was told to continue vaginal estrogen cream in the urine culture was positive on that same day of the visit.  It appears she has had 3+ urine cultures in the last 2 months  Today Patient describes back-to-back infections over the last 5 months.  When she is not infected she voids 3 times a night and every 2 hours during the day and stays continent.  She has had a previous sling.  She has not had kidney stones.  No blood in urine today.  Urine sent for culture  Modifying factors: There are no other modifying factors  Associated signs and symptoms: There are no other associated signs and symptoms Aggravating and relieving factors: There are no other aggravating or relieving factors Severity: Moderate Duration: Persistent   PMH: Past Medical History:  Diagnosis Date  . Anemia   . Anxiety   . Chronic kidney disease    UTI, hematuria in urine  . Depression   . Diabetes ([place])   . Diverticulosis   . Frequent headaches   . Interstitial cystitis   . Recurrent UTI   . Restless leg syndrome   . Urinary frequency     Surgical History: Past Surgical History:  Procedure Laterality Date  . ABDOMINAL HYSTERECTOMY    . bariatric bypass  2012  . CARPAL TUNNEL RELEASE Right 2003  . CARPAL TUNNEL RELEASE Right    2008  . CHOLECYSTECTOMY  1975  . CYSTOSCOPY W/ RETROGRADES Bilateral 06/06/2015   Procedure: CYSTOSCOPY WITH RETROGRADE PYELOGRAM;  Surgeon: Festus Aloe, MD;  Location: [facility] ORS;  Service: Urology;  Laterality: Bilateral;  . FL INJ LEFT KNEE CT ARTHROGRAM ([facility] HX) Left    1995  . GASTRIC BYPASS  2010  . [place]  2013  . KNEE ARTHROSCOPY Left 1996  . TONSILLECTOMY      Home Medications:  Allergies as of 10/06/2018      Reactions   Avelox [moxifloxacin Hcl In Nacl] Anaphylaxis   Bactrim [sulfamethoxazole-trimethoprim] Anaphylaxis   Depakote [divalproex Sodium]    Imitrex [sumatriptan] Other (See Comments)   Neck and shoulder pain   Stadol [butorphanol] Rash      Medication List       Accurate as of October 06, 2018  2:28 PM. Always use your most recent med list.        ALPRAZolam 0.5 MG tablet Commonly known as:  XANAX TAKE 1 TABLET BY MOUTH EVERY 6 HOURS AS NEEDED FOR ANXIETY   buPROPion 150 MG 24 hr tablet Commonly known as:  WELLBUTRIN XL Take 3 tablets (450 mg total) by mouth daily.   butalbital-aspirin-caffeine 50-325-40 MG capsule Commonly known as:  FIORINAL Take 1 capsule by mouth daily as needed for headache.   cyclobenzaprine 10 MG tablet Commonly known as:  FLEXERIL Take 1 tablet (10 mg total) by mouth 3 (three) times daily as  needed for muscle spasms.   estradiol 0.1 MG/GM vaginal cream Commonly known as:  ESTRACE VAGINAL Apply 0.26m (pea-sized amount)  just inside the vaginal introitus with a finger-tip on Monday, Wednesday and Friday nights.   fluticasone 50 MCG/ACT nasal spray Commonly known as:  FLONASE Place 2 sprays into the nose as needed.   furosemide 20 MG tablet Commonly known as:  LASIX Take 1 tablet (20 mg total) by mouth daily.   Galcanezumab-gnlm 120 MG/ML Soaj Commonly known as:  Emgality Inject 1 pen into the skin every 30 (thirty) days.   glipiZIDE 5 MG tablet Commonly known as:  GLUCOTROL Take 1 tablet (5 mg total) by mouth 2 (two) times daily before a meal.   lamoTRIgine 200 MG tablet Commonly  known as:  LAMICTAL TAKE 2 TABLETS BY MOUTH AT BEDTIME   metFORMIN 1000 MG tablet Commonly known as:  GLUCOPHAGE TAKE 1 TABLET BY MOUTH TWO  TIMES DAILY WITH MEALS   naproxen 500 MG EC tablet Commonly known as:  Naproxen DR Take 1 tablet (500 mg total) 2 (two) times daily with a meal by mouth.   omeprazole 20 MG tablet Commonly known as:  PriLOSEC OTC Take 1 tablet (20 mg total) by mouth daily.   ondansetron 4 MG tablet Commonly known as:  Zofran Take 1 tablet (4 mg total) by mouth every 8 (eight) hours as needed for nausea or vomiting.   ONE TOUCH ULTRA TEST test strip Generic drug:  glucose blood daily.   pramipexole 0.5 MG tablet Commonly known as:  MIRAPEX Take 1 tablet (0.5 mg total) by mouth at bedtime.   risperiDONE 3 MG tablet Commonly known as:  RISPERDAL Take 3 mg by mouth at bedtime.   rizatriptan 10 MG tablet Commonly known as:  Maxalt Take 1 tablet (10 mg total) by mouth as needed for migraine. May repeat in 2 hours if needed   sertraline 100 MG tablet Commonly known as:  ZOLOFT Take 1 tablet (100 mg total) by mouth every morning.   zolpidem 10 MG tablet Commonly known as:  AMBIEN TAKE 1/2 TO 1 (ONE-HALF TO ONE) TABLET BY MOUTH AT BEDTIME AS NEEDED FOR SLEEP       Allergies:  Allergies  Allergen Reactions  . Avelox [Moxifloxacin Hcl In Nacl] Anaphylaxis  . Bactrim [Sulfamethoxazole-Trimethoprim] Anaphylaxis  . Depakote [Divalproex Sodium]   . Imitrex [Sumatriptan] Other (See Comments)    Neck and shoulder pain  . Stadol [Butorphanol] Rash    Family History: Family History  Problem Relation Age of Onset  . Stroke Father   . Heart failure Sister   . Bladder Cancer Neg Hx   . Kidney disease Neg Hx   . Prostate cancer Neg Hx   . Kidney cancer Neg Hx     Social History:  reports that she quit smoking about 43 years ago. Her smoking use included cigarettes. She smoked 0.50 packs per day. She has never used smokeless tobacco. She reports that  she does not drink alcohol or use drugs.  ROS: UROLOGY Frequent Urination?: Yes Hard to postpone urination?: No Burning/pain with urination?: Yes Get up at night to urinate?: Yes Leakage of urine?: No Urine stream starts and stops?: No Trouble starting stream?: Yes Do you have to strain to urinate?: No Blood in urine?: No Urinary tract infection?: Yes Sexually transmitted disease?: No Injury to kidneys or bladder?: No Painful intercourse?: No Weak stream?: No Currently pregnant?: No Vaginal bleeding?: No Last menstrual period?: n  Gastrointestinal Nausea?:  Yes Vomiting?: No Indigestion/heartburn?: No Diarrhea?: Yes Constipation?: No  Constitutional Fever: No Night sweats?: No Weight loss?: No Fatigue?: No  Skin Skin rash/lesions?: No Itching?: No  Eyes Blurred vision?: No Double vision?: No  Ears/Nose/Throat Sore throat?: No Sinus problems?: No  Hematologic/Lymphatic Swollen glands?: No Easy bruising?: Yes  Cardiovascular Leg swelling?: Yes Chest pain?: No  Respiratory Cough?: No Shortness of breath?: No  Endocrine Excessive thirst?: No  Musculoskeletal Back pain?: No Joint pain?: No  Neurological Headaches?: Yes Dizziness?: No  Psychologic Depression?: Yes Anxiety?: Yes  Physical Exam: BP 125/80 (BP Location: Left Arm, Patient Position: Sitting)   Pulse (!) 105   Ht 5' 2"  (1.575 m)   Wt 148 lb (67.1 kg)   LMP  (LMP Unknown)   BMI 27.07 kg/m   Constitutional:  Alert and oriented, No acute distress.  Laboratory Data: Lab Results  Component Value Date   WBC 4.4 10/17/2016   HGB 10.7 (L) 10/17/2016   HCT 34.1 10/17/2016   MCV 75 (L) 10/17/2016   PLT 368 10/17/2016    Lab Results  Component Value Date   CREATININE 0.98 08/22/2018    No results found for: PSA  No results found for: TESTOSTERONE  Lab Results  Component Value Date   HGBA1C 6.7 08/22/2018    Urinalysis    Component Value Date/Time   COLORURINE  STRAW (A) 05/22/2015 1431   APPEARANCEUR Clear 09/16/2018 1103   LABSPEC 1.004 (L) 05/22/2015 1431   LABSPEC 1.003 01/09/2012 2126   PHURINE 6.0 05/22/2015 1431   GLUCOSEU Negative 09/16/2018 1103   GLUCOSEU Negative 01/09/2012 2126   HGBUR 1+ (A) 05/22/2015 1431   BILIRUBINUR Negative 09/16/2018 1103   BILIRUBINUR Negative 01/09/2012 2126   KETONESUR NEGATIVE 05/22/2015 1431   PROTEINUR Negative 09/16/2018 1103   PROTEINUR NEGATIVE 05/22/2015 1431   NITRITE Positive (A) 09/16/2018 1103   NITRITE NEGATIVE 05/22/2015 1431   LEUKOCYTESUR Negative 09/16/2018 1103   LEUKOCYTESUR Negative 01/09/2012 2126    Pertinent Imaging:   Assessment & Plan: Patient will have a renal ultrasound.  Pathophysiology described.  Call if ultrasound is normal.  Call if cultures positive.  Start patient on Macrodantin and reevaluate in 7 weeks.  She has significant allergies to a number of antibiotics.  I did not yet perform cystoscopy but she has had a previous sling  1. Recurrent UTI  - Urinalysis, Complete   No follow-ups on file.  Reece Packer, MD  [facility] [address], [place], [place] [address] [phone]

## 2018-10-09 LAB — CULTURE, URINE COMPREHENSIVE

## 2018-10-14 ENCOUNTER — Telehealth: Payer: Self-pay | Admitting: Family Medicine

## 2018-10-14 ENCOUNTER — Telehealth: Payer: Self-pay | Admitting: Urology

## 2018-10-14 DIAGNOSIS — B9620 Unspecified Escherichia coli [E. coli] as the cause of diseases classified elsewhere: Secondary | ICD-10-CM

## 2018-10-14 DIAGNOSIS — N390 Urinary tract infection, site not specified: Secondary | ICD-10-CM

## 2018-10-14 MED ORDER — CIPROFLOXACIN HCL 250 MG PO TABS: 250.0000 mg | ORAL_TABLET | Freq: Two times a day (BID) | ORAL | 0 refills | Status: DC

## 2018-10-14 NOTE — Telephone Encounter (Signed)
Pt called office again stating that she's allergic to Cipro and that should be in her chart.  She would like to know if something else can be called in for her infection.  Please give pt a call.

## 2018-10-14 NOTE — Telephone Encounter (Signed)
-----   Message from Chrystie Nose, [place] sent at 10/13/2018 10:00 AM EDT -----  ----- Message ----- From: Bjorn Loser, MD Sent: 10/12/2018  11:25 AM EDT To: Chrystie Nose, CMA   Cipro 250 mg bid for 7 days, then restart daily macrodantin        ----- Message ----- From: Chrystie Nose, CMA Sent: 10/08/2018   8:15 AM EDT To: Bjorn Loser, MD   ----- Message ----- From: Interface, [facility] Lab Results In Sent: 10/06/2018   4:36 PM EDT To: [facility]

## 2018-10-14 NOTE — Telephone Encounter (Signed)
Pt Amy Hansen and states that Cipro was called in for her and she is allergic to Cipro. Please advise.

## 2018-10-14 NOTE — Telephone Encounter (Signed)
LMOM for patient to pick up ABX from pharmacy for UTI.

## 2018-10-15 MED ORDER — CEPHALEXIN 500 MG PO CAPS: 500.0000 mg | ORAL_CAPSULE | Freq: Three times a day (TID) | ORAL | 0 refills | Status: AC

## 2018-10-15 NOTE — Telephone Encounter (Signed)
Called pt to inquire about what reaction she has when taking Cipro, no answer, left detailed message for pt to call back and give [name] more details so that I can update chart accordingly. Can we please send a new rx in the meantime? Thanks

## 2018-10-15 NOTE — Telephone Encounter (Signed)
Pt returns call she states that she was told by a pharmacist never to take cipro as it is in the Avelox family. New rx for Keflex sent in.

## 2018-10-15 NOTE — Addendum Note (Signed)
Addended by: Donalee Citrin on: 10/15/2018 04:29 PM   Modules accepted: Orders

## 2018-10-15 NOTE — Telephone Encounter (Signed)
Keflex 500 mg 3 times a day for 7 days

## 2018-10-25 ENCOUNTER — Other Ambulatory Visit: Payer: Self-pay | Admitting: Physician Assistant

## 2018-10-28 ENCOUNTER — Other Ambulatory Visit: Payer: Self-pay | Admitting: Physician Assistant

## 2018-11-10 ENCOUNTER — Telehealth: Payer: Self-pay | Admitting: Urology

## 2018-11-10 NOTE — Telephone Encounter (Signed)
Pt called and states that she thinks that she may have another UTI, Her urine is cloudy she is having discomfort when she urinates and also states that her urine has an odor. Please advise.

## 2018-11-11 ENCOUNTER — Other Ambulatory Visit: Payer: Self-pay

## 2018-11-11 ENCOUNTER — Other Ambulatory Visit: Payer: Self-pay | Admitting: Radiology

## 2018-11-11 DIAGNOSIS — N390 Urinary tract infection, site not specified: Secondary | ICD-10-CM

## 2018-11-11 LAB — URINALYSIS, COMPLETE
Bilirubin, UA: NEGATIVE
Ketones, UA: NEGATIVE
Nitrite, UA: POSITIVE — AB
Protein,UA: NEGATIVE
RBC, UA: NEGATIVE
Specific Gravity, UA: 1.015 (ref 1.005–1.030)
Urobilinogen, Ur: 0.2 mg/dL (ref 0.2–1.0)
pH, UA: 5.5 (ref 5.0–7.5)

## 2018-11-11 LAB — MICROSCOPIC EXAMINATION: RBC, Urine: NONE SEEN /hpf (ref 0–2)

## 2018-11-11 MED ORDER — CEFUROXIME AXETIL 250 MG PO TABS: 250.0000 mg | ORAL_TABLET | Freq: Two times a day (BID) | ORAL | 0 refills | Status: AC

## 2018-11-11 NOTE — Telephone Encounter (Signed)
Patient added to lab schedule. Will consult with Dr Bernardo Heater on UA and advise patient accordingly

## 2018-11-11 NOTE — Telephone Encounter (Signed)
Dr Bernardo Heater reviewed UA. Ceftin sent to [facility] on [address]. Called and advised patient. Will call with CX results. Patient verbalized understanding.

## 2018-11-11 NOTE — Addendum Note (Signed)
Addended by: Feliberto Gottron on: 11/11/2018 02:49 PM   Modules accepted: Orders

## 2018-11-14 LAB — CULTURE, URINE COMPREHENSIVE

## 2018-11-17 ENCOUNTER — Telehealth: Payer: Self-pay

## 2018-11-17 NOTE — Telephone Encounter (Signed)
-----   Message from Abbie Sons, MD sent at 11/17/2018 12:54 PM EDT ----- Urine culture was positive and sensitive to cefuroxime

## 2018-11-17 NOTE — Telephone Encounter (Signed)
Called pt informed her of of the information below. Pt gave verbal understanding.

## 2018-11-20 ENCOUNTER — Telehealth: Payer: Self-pay | Admitting: Family Medicine

## 2018-11-20 NOTE — Telephone Encounter (Signed)
Called pt to set up virtual visit for tomorrow, no answer, left voicemail.

## 2018-11-21 ENCOUNTER — Ambulatory Visit: Payer: Self-pay | Admitting: Family Medicine

## 2018-11-24 ENCOUNTER — Ambulatory Visit: Payer: BLUE CROSS/BLUE SHIELD | Admitting: Urology

## 2018-12-03 ENCOUNTER — Other Ambulatory Visit: Payer: Self-pay | Admitting: Physician Assistant

## 2018-12-09 ENCOUNTER — Telehealth: Payer: Self-pay | Admitting: Urology

## 2018-12-09 ENCOUNTER — Other Ambulatory Visit: Payer: Self-pay

## 2018-12-09 DIAGNOSIS — N390 Urinary tract infection, site not specified: Secondary | ICD-10-CM

## 2018-12-09 LAB — URINALYSIS, COMPLETE
Bilirubin, UA: NEGATIVE
Ketones, UA: NEGATIVE
Nitrite, UA: NEGATIVE
Protein,UA: NEGATIVE
RBC, UA: NEGATIVE
Specific Gravity, UA: 1.015 (ref 1.005–1.030)
Urobilinogen, Ur: 0.2 mg/dL (ref 0.2–1.0)
pH, UA: 5 (ref 5.0–7.5)

## 2018-12-09 LAB — MICROSCOPIC EXAMINATION: RBC, Urine: NONE SEEN /hpf (ref 0–2)

## 2018-12-09 MED ORDER — CEFUROXIME AXETIL 250 MG PO TABS: 250.0000 mg | ORAL_TABLET | Freq: Two times a day (BID) | ORAL | 0 refills | Status: AC

## 2018-12-09 NOTE — Telephone Encounter (Signed)
Patient is coming in to drop off a UA for  Possible UTI Burning with urination, odor and cloudy urine I have added her to the lab schedule

## 2018-12-09 NOTE — Telephone Encounter (Signed)
Pt presents in clinic for UA and CX currently pt is c/o urinary frequency, burning with urination, back pain, nausea, vomiting, and night sweats. She has been on an antibiotic in the past 30 days and has been taking a suppressive antibiotic.  Upon reviewing UA per Dr.Stoioff pt to have Ceftin 258m BID x 7 days. RX sent pt informed.

## 2018-12-09 NOTE — Telephone Encounter (Signed)
Patient notified of prescription and expressed understanding.

## 2018-12-11 ENCOUNTER — Telehealth: Payer: Self-pay | Admitting: Family Medicine

## 2018-12-11 MED ORDER — CYCLOBENZAPRINE HCL 10 MG PO TABS: 10.0000 mg | ORAL_TABLET | Freq: Three times a day (TID) | ORAL | 0 refills | Status: DC | PRN

## 2018-12-11 NOTE — Telephone Encounter (Signed)
Rx refilled.

## 2018-12-11 NOTE — Telephone Encounter (Signed)
Copied from [place] [id number]. Topic: Quick Communication - Rx Refill/Question >> Dec 11, 2018 10:01 AM Rayann Heman wrote: Medication: cyclobenzaprine (FLEXERIL) 10 MG tablet [[id number]]   Has the patient contacted their pharmacy? no Preferred Pharmacy (with phone number or street name):[facility] [address], [place] - [place] [phone] (Phone) [phone] (Fax)    Agent: Please be advised that RX refills may take up to 3 business days. We ask that you follow-up with your pharmacy.

## 2018-12-14 LAB — CULTURE, URINE COMPREHENSIVE

## 2018-12-15 ENCOUNTER — Telehealth: Payer: Self-pay

## 2018-12-15 NOTE — Telephone Encounter (Signed)
-----   Message from Abbie Sons, MD sent at 12/15/2018  2:31 PM EDT ----- Urine culture was positive and sensitive to prescribed antibiotic

## 2018-12-16 ENCOUNTER — Ambulatory Visit (INDEPENDENT_AMBULATORY_CARE_PROVIDER_SITE_OTHER): Payer: BLUE CROSS/BLUE SHIELD | Admitting: Physician Assistant

## 2018-12-16 ENCOUNTER — Other Ambulatory Visit: Payer: Self-pay

## 2018-12-16 ENCOUNTER — Encounter: Payer: Self-pay | Admitting: Physician Assistant

## 2018-12-16 DIAGNOSIS — F319 Bipolar disorder, unspecified: Secondary | ICD-10-CM

## 2018-12-16 DIAGNOSIS — G4700 Insomnia, unspecified: Secondary | ICD-10-CM

## 2018-12-16 DIAGNOSIS — F411 Generalized anxiety disorder: Secondary | ICD-10-CM

## 2018-12-16 MED ORDER — ZOLPIDEM TARTRATE ER 12.5 MG PO TBCR: 12.5000 mg | EXTENDED_RELEASE_TABLET | Freq: Every evening | ORAL | 0 refills | Status: DC | PRN

## 2018-12-16 NOTE — Progress Notes (Signed)
[facility]  Patient ID: Amy Hansen,  MRN: [id number]  PCP: Volney American, PA-C  Date of Evaluation: 12/16/2018 Time spent:15 minutes  Chief Complaint:  Chief Complaint    Depression; Anxiety; Insomnia; Medication Refill     Virtual Visit via Telephone Note  I connected with patient by a video enabled telemedicine application or telephone, with their informed consent, and verified patient privacy and that I am speaking with the correct person using two identifiers.  I am private, in my home and the patient is at home.  I discussed the limitations, risks, security and privacy concerns of performing an evaluation and management service by telephone and the availability of in person appointments. I also discussed with the patient that there may be a patient responsible charge related to this service. The patient expressed understanding and agreed to proceed.   I discussed the assessment and treatment plan with the patient. The patient was provided an opportunity to ask questions and all were answered. The patient agreed with the plan and demonstrated an understanding of the instructions.   The patient was advised to call back or seek an in-person evaluation if the symptoms worsen or if the condition fails to improve as anticipated.  I provided 15 minutes of non-face-to-face time during this encounter.  HISTORY/CURRENT STATUS: HPI for 68-monthmed check.  Patient states she is doing really well mentally.  Her only complaint is not being able to sleep through the night.  The Ambien does put her to sleep for about 4 hours but then she starts waking up frequently.  She will sometimes have trouble going back to sleep.  Patient denies loss of interest in usual activities and is able to enjoy things.  Denies decreased energy or motivation.  Appetite has not changed.  No extreme sadness, tearfulness, or feelings of hopelessness.  Denies any changes in concentration,  making decisions or remembering things.  Denies suicidal or homicidal thoughts.  Anxiety is well controlled with the Xanax.  The coronavirus pandemic has caused a little more anxiety but relieved with the Xanax.  Patient denies increased energy with decreased need for sleep, no increased talkativeness, no racing thoughts, no impulsivity or risky behaviors, no increased spending, no increased libido, no grandiosity.  Denies dizziness, syncope, seizures, numbness, tingling, tremor, tics, unsteady gait, slurred speech, confusion. Denies muscle or joint pain, stiffness, or dystonia.  Individual Medical History/ Review of Systems: Changes? :No    Past medications for mental health diagnoses include: Trazodone, Risperdal, Zoloft, Lunesta, praises son, [employer] Prozac, Depakote, Lamictal, lithium, Wellbutrin, Xanax, Ambien, carbamazepine  Allergies: Avelox [moxifloxacin hcl in nacl]; Bactrim [sulfamethoxazole-trimethoprim]; Ciprofloxacin; Depakote [divalproex sodium]; Imitrex [sumatriptan]; and Stadol [butorphanol]  Current Medications:  Current Outpatient Medications:  .  ALPRAZolam (XANAX) 0.5 MG tablet, TAKE 1 TABLET BY MOUTH EVERY 6 HOURS AS NEEDED FOR ANXIETY, Disp: 120 tablet, Rfl: 1 .  buPROPion (WELLBUTRIN XL) 150 MG 24 hr tablet, Take 3 tablets (450 mg total) by mouth daily., Disp: 270 tablet, Rfl: 1 .  butalbital-aspirin-caffeine (FIORINAL) 50-325-40 MG capsule, Take 1 capsule by mouth daily as needed for headache., Disp: 15 capsule, Rfl: 0 .  cefUROXime (CEFTIN) 250 MG tablet, Take 1 tablet (250 mg total) by mouth 2 (two) times daily with a meal for 7 days., Disp: 14 tablet, Rfl: 0 .  cyclobenzaprine (FLEXERIL) 10 MG tablet, Take 1 tablet (10 mg total) by mouth 3 (three) times daily as needed for muscle spasms., Disp: 30 tablet, Rfl: 0 .  estradiol (ESTRACE VAGINAL) 0.1 MG/GM vaginal cream, Apply 0.92m (pea-sized amount)  just inside the vaginal introitus with a finger-tip on Monday,  Wednesday and Friday nights., Disp: 30 g, Rfl: 12 .  fluticasone (FLONASE) 50 MCG/ACT nasal spray, Place 2 sprays into the nose as needed. , Disp: , Rfl:  .  furosemide (LASIX) 20 MG tablet, Take 1 tablet (20 mg total) by mouth daily., Disp: 90 tablet, Rfl: 3 .  glipiZIDE (GLUCOTROL) 5 MG tablet, Take 1 tablet (5 mg total) by mouth 2 (two) times daily before a meal., Disp: 180 tablet, Rfl: 1 .  lamoTRIgine (LAMICTAL) 200 MG tablet, TAKE 2 TABLETS BY MOUTH AT BEDTIME (Patient taking differently: 200 mg daily. ), Disp: 180 tablet, Rfl: 0 .  metFORMIN (GLUCOPHAGE) 1000 MG tablet, TAKE 1 TABLET BY MOUTH TWO  TIMES DAILY WITH MEALS, Disp: 180 tablet, Rfl: 1 .  naproxen (NAPROXEN DR) 500 MG EC tablet, Take 1 tablet (500 mg total) 2 (two) times daily with a meal by mouth., Disp: 10 tablet, Rfl: 0 .  omeprazole (PRILOSEC OTC) 20 MG tablet, Take 1 tablet (20 mg total) by mouth daily., Disp: 90 tablet, Rfl: 1 .  ondansetron (ZOFRAN) 4 MG tablet, Take 1 tablet (4 mg total) by mouth every 8 (eight) hours as needed for nausea or vomiting., Disp: 30 tablet, Rfl: 0 .  ONE TOUCH ULTRA TEST test strip, daily., Disp: , Rfl:  .  pramipexole (MIRAPEX) 0.5 MG tablet, Take 1 tablet (0.5 mg total) by mouth at bedtime., Disp: 90 tablet, Rfl: 3 .  risperiDONE (RISPERDAL) 3 MG tablet, Take 3 mg by mouth at bedtime. , Disp: , Rfl:  .  sertraline (ZOLOFT) 100 MG tablet, Take 1 tablet (100 mg total) by mouth every morning., Disp: 90 tablet, Rfl: 1 .  ciprofloxacin (CIPRO) 250 MG tablet, Take 1 tablet (250 mg total) by mouth 2 (two) times daily. (Patient not taking: Reported on 12/16/2018), Disp: 14 tablet, Rfl: 0 .  Galcanezumab-gnlm (EMGALITY) 120 MG/ML SOAJ, Inject 1 pen into the skin every 30 (thirty) days. (Patient not taking: Reported on 12/16/2018), Disp: 3 pen, Rfl: 1 .  nitrofurantoin (MACRODANTIN) 100 MG capsule, Take 1 capsule (100 mg total) by mouth daily. (Patient not taking: Reported on 12/16/2018), Disp: 30 capsule,  Rfl: 11 .  rizatriptan (MAXALT) 10 MG tablet, Take 1 tablet (10 mg total) by mouth as needed for migraine. May repeat in 2 hours if needed (Patient not taking: Reported on 12/16/2018), Disp: 9 tablet, Rfl: 0 .  zolpidem (AMBIEN CR) 12.5 MG CR tablet, Take 1 tablet (12.5 mg total) by mouth at bedtime as needed for sleep., Disp: 30 tablet, Rfl: 0 Medication Side Effects: none  Family Medical/ Social History: Changes? Yes still unemployed but is looking for a job.  States the coronavirus pandemic has "put that on hold."  She is a pCharity fundraiser  MENTAL HEALTH EXAM:  There were no vitals taken for this visit.There is no height or weight on file to calculate BMI.  General Appearance: unable to assess  Eye Contact:  unable to assess  Speech:  Clear and Coherent  Volume:  Normal  Mood:  Euthymic  Affect:  unable to assess  Thought Process:  Goal Directed  Orientation:  Full (Time, Place, and Person)  Thought Content: Logical   Suicidal Thoughts:  No  Homicidal Thoughts:  No  Memory:  WNL  Judgement:  Good  Insight:  Good  Psychomotor Activity:  unable to assess  Concentration:  Concentration:  Good  Recall:  Good  Fund of Knowledge: Good  Language: Good  Assets:  Desire for Improvement  ADL's:  Intact  Cognition: WNL  Prognosis:  Good    DIAGNOSES:    ICD-10-CM   1. Bipolar I disorder ([place]) F31.9   2. Generalized anxiety disorder F41.1   3. Insomnia, unspecified type G47.00     Receiving Psychotherapy: No    RECOMMENDATIONS:  DC Ambien. Start Ambien CR 12.5 mg nightly as needed.  I am only giving her enough for 1 month.  She will call back in approximately 3 weeks and let me know how it is working.  If it is working well, I will send in a prescription with refills, enough to get her through to her next appointment. Sleep hygiene was discussed. Continue Xanax 0.5 mg 1 every 6 hours as needed anxiety. Continue Wellbutrin XL 150 mg, 3 p.o. every morning. Continue Lamictal 200  mg, 2 nightly. Continue Risperdal 3 mg nightly. Continue Zoloft 100 mg every morning. She has lab work through her PCP and the diabetes is followed by them. Return in 6 months.  Donnal Moat, PA-C   This record has been created using [employer].  Chart creation errors have been sought, but may not always have been located and corrected. Such creation errors do not reflect on the standard of medical care.

## 2018-12-24 ENCOUNTER — Other Ambulatory Visit: Payer: Self-pay | Admitting: Physician Assistant

## 2018-12-24 NOTE — Telephone Encounter (Signed)
Last visit in May f/up 6 months. Any refills?

## 2019-01-05 ENCOUNTER — Other Ambulatory Visit: Payer: Self-pay

## 2019-01-05 ENCOUNTER — Encounter: Payer: Self-pay | Admitting: Urology

## 2019-01-05 ENCOUNTER — Ambulatory Visit: Payer: BLUE CROSS/BLUE SHIELD | Admitting: Urology

## 2019-01-05 MED ORDER — RISPERIDONE 3 MG PO TABS: 3.0000 mg | ORAL_TABLET | Freq: Every day | ORAL | 1 refills | Status: DC

## 2019-01-15 ENCOUNTER — Other Ambulatory Visit: Payer: Self-pay | Admitting: Physician Assistant

## 2019-02-09 ENCOUNTER — Ambulatory Visit (INDEPENDENT_AMBULATORY_CARE_PROVIDER_SITE_OTHER): Payer: BLUE CROSS/BLUE SHIELD | Admitting: Urology

## 2019-02-09 ENCOUNTER — Encounter: Payer: Self-pay | Admitting: Urology

## 2019-02-09 ENCOUNTER — Other Ambulatory Visit: Payer: Self-pay

## 2019-02-09 VITALS — BP 133/82 | HR 114 | Ht 62.0 in | Wt 145.0 lb

## 2019-02-09 DIAGNOSIS — N390 Urinary tract infection, site not specified: Secondary | ICD-10-CM

## 2019-02-09 LAB — URINALYSIS, COMPLETE
Bilirubin, UA: NEGATIVE
Nitrite, UA: POSITIVE — AB
Protein,UA: NEGATIVE
RBC, UA: NEGATIVE
Specific Gravity, UA: 1.02 (ref 1.005–1.030)
Urobilinogen, Ur: 0.2 mg/dL (ref 0.2–1.0)
pH, UA: 5.5 (ref 5.0–7.5)

## 2019-02-09 LAB — MICROSCOPIC EXAMINATION: RBC, Urine: NONE SEEN /hpf (ref 0–2)

## 2019-02-09 MED ORDER — NITROFURANTOIN MACROCRYSTAL 100 MG PO CAPS: 100.0000 mg | ORAL_CAPSULE | Freq: Every day | ORAL | 11 refills | Status: DC

## 2019-02-09 NOTE — Progress Notes (Signed)
02/09/2019 3:25 PM   Amy Hansen 1956/07/05 [id number]  Referring provider: Volney American, PA-C [address] [place],  [place] [address]  No chief complaint on file.   HPI: Amy Hansen with a history of hematuria, [name]  When seen in February by nurse practitioner she had cystitis symptoms.  It appears in 2016 she was evaluated with retrogrades and cystoscopy and a nonenhanced CT scan that was negative.  She was told to continue vaginal estrogen cream in the urine culture was positive on that same day of the visit.  It appears she has had 3+ urine cultures in the last 2 months  Today Patient describes back-to-back infections over the last 5 months.  When she is not infected she voids 3 times a night and every 2 hours during the day and stays continent.  She has had a previous sling.  She has not had kidney stones.    Patient will have a renal ultrasound.  Pathophysiology described.  Call if ultrasound is normal.  Call if cultures positive.  Start patient on Macrodantin and reevaluate in 7 weeks.  She has significant allergies to a number of antibiotics.  I did not yet perform cystoscopy but she has had a previous sling  Today When I saw the patient in March the urine culture is positive.  She has had 2+ culture since.  Frequency stable  Family history a little bit difficult but she seems to have been doing very well in the last 8 or 10 weeks on daily Macrodantin.  In the last 3 days her urine is a bit foul-smelling with intermittent burning and we sent the urine for culture.  She said she is been doing well on it.  She never had the ultrasound.  She is been worked up extensively as noted above  Modifying factors: There are no other modifying factors  Associated signs and symptoms: There are no other associated signs and symptoms  Aggravating and relieving factors: There are no other aggravating or relieving factors Severity: Moderate Duration: Persistent  PMH: Past Medical History:  Diagnosis Date  . Anemia   . Anxiety   . Chronic kidney disease    UTI, hematuria in urine  . Depression   . Diabetes ([place])   . Diverticulosis   . Frequent headaches   . Interstitial cystitis   . Recurrent UTI   . Restless leg syndrome   . Urinary frequency     Surgical History: Past Surgical History:  Procedure Laterality Date  . ABDOMINAL HYSTERECTOMY    . bariatric bypass  2012  . CARPAL TUNNEL RELEASE Right 2003  . CARPAL TUNNEL RELEASE Right    2008  . CHOLECYSTECTOMY  1975  . CYSTOSCOPY W/ RETROGRADES Bilateral 06/06/2015   Procedure: CYSTOSCOPY WITH RETROGRADE PYELOGRAM;  Surgeon: Festus Aloe, MD;  Location: [facility] ORS;  Service: Urology;  Laterality: Bilateral;  . FL INJ LEFT KNEE CT ARTHROGRAM ([facility] HX) Left    1995  . GASTRIC BYPASS  2010  . [place]  2013  . KNEE ARTHROSCOPY Left 1996  . TONSILLECTOMY      Home Medications:  Allergies as of 02/09/2019      Reactions   Avelox [moxifloxacin Hcl In Nacl] Anaphylaxis   Bactrim [sulfamethoxazole-trimethoprim] Anaphylaxis   Ciprofloxacin Other (See Comments)   Pt states she was told never to take  this as it is in the same family as Avelox.    Depakote [divalproex Sodium]    Imitrex [sumatriptan] Other (See Comments)   Neck and shoulder pain   Stadol [butorphanol] Rash      Medication List       Accurate as of February 09, 2019  3:25 PM. If you have any questions, ask your nurse or doctor.        ALPRAZolam 0.5 MG tablet Commonly known as: XANAX TAKE 1 TABLET BY MOUTH EVERY 6 HOURS AS NEEDED FOR ANXIETY   buPROPion 150 MG 24 hr tablet Commonly known as: WELLBUTRIN XL Take 3 tablets (450 mg total) by mouth daily.   butalbital-aspirin-caffeine 50-325-40 MG capsule Commonly known as: FIORINAL Take 1 capsule by mouth daily as needed for  headache.   ciprofloxacin 250 MG tablet Commonly known as: Cipro Take 1 tablet (250 mg total) by mouth 2 (two) times daily.   cyclobenzaprine 10 MG tablet Commonly known as: FLEXERIL Take 1 tablet (10 mg total) by mouth 3 (three) times daily as needed for muscle spasms.   estradiol 0.1 MG/GM vaginal cream Commonly known as: ESTRACE VAGINAL Apply 0.32m (pea-sized amount)  just inside the vaginal introitus with a finger-tip on Monday, Wednesday and Friday nights.   fluticasone 50 MCG/ACT nasal spray Commonly known as: FLONASE Place 2 sprays into the nose as needed.   furosemide 20 MG tablet Commonly known as: LASIX Take 1 tablet (20 mg total) by mouth daily.   Galcanezumab-gnlm 120 MG/ML Soaj Commonly known as: Emgality Inject 1 pen into the skin every 30 (thirty) days.   glipiZIDE 5 MG tablet Commonly known as: GLUCOTROL Take 1 tablet (5 mg total) by mouth 2 (two) times daily before a meal.   lamoTRIgine 200 MG tablet Commonly known as: LAMICTAL TAKE 2 TABLETS BY MOUTH AT BEDTIME What changed:   how much to take  how to take this  when to take this   metFORMIN 1000 MG tablet Commonly known as: GLUCOPHAGE TAKE 1 TABLET BY MOUTH TWO  TIMES DAILY WITH MEALS   naproxen 500 MG EC tablet Commonly known as: Naproxen DR Take 1 tablet (500 mg total) 2 (two) times daily with a meal by mouth.   nitrofurantoin 100 MG capsule Commonly known as: Macrodantin Take 1 capsule (100 mg total) by mouth daily.   omeprazole 20 MG tablet Commonly known as: PriLOSEC OTC Take 1 tablet (20 mg total) by mouth daily.   ondansetron 4 MG tablet Commonly known as: Zofran Take 1 tablet (4 mg total) by mouth every 8 (eight) hours as needed for nausea or vomiting.   ONE TOUCH ULTRA TEST test strip Generic drug: glucose blood daily.   pramipexole 0.5 MG tablet Commonly known as: MIRAPEX Take 1 tablet (0.5 mg total) by mouth at bedtime.   risperiDONE 3 MG tablet Commonly known as:  RISPERDAL Take 1 tablet (3 mg total) by mouth at bedtime.   rizatriptan 10 MG tablet Commonly known as: Maxalt Take 1 tablet (10 mg total) by mouth as needed for migraine. May repeat in 2 hours if needed   sertraline 100 MG tablet Commonly known as: ZOLOFT Take 1 tablet (100 mg total) by mouth every morning.   zolpidem 12.5 MG CR tablet Commonly known as: AMBIEN CR TAKE 1 TABLET BY MOUTH AT BEDTIME AS NEEDED FOR SLEEP       Allergies:  Allergies  Allergen Reactions  . Avelox [Moxifloxacin Hcl In Nacl] Anaphylaxis  . Bactrim [Sulfamethoxazole-Trimethoprim]  Anaphylaxis  . Ciprofloxacin Other (See Comments)    Pt states she was told never to take this as it is in the same family as Avelox.   . Depakote [Divalproex Sodium]   . Imitrex [Sumatriptan] Other (See Comments)    Neck and shoulder pain  . Stadol [Butorphanol] Rash    Family History: Family History  Problem Relation Age of Onset  . Stroke Father   . Heart failure Sister   . Bladder Cancer Neg Hx   . Kidney disease Neg Hx   . Prostate cancer Neg Hx   . Kidney cancer Neg Hx     Social History:  reports that she quit smoking about 43 years ago. Her smoking use included cigarettes. She smoked 0.50 packs per day. She has never used smokeless tobacco. She reports that she does not drink alcohol or use drugs.  ROS:                                        Physical Exam: LMP  (LMP Unknown)   Constitutional:  Alert and oriented, No acute distress.   Laboratory Data: Lab Results  Component Value Date   WBC 4.4 10/17/2016   HGB 10.7 (L) 10/17/2016   HCT 34.1 10/17/2016   MCV 75 (L) 10/17/2016   PLT 368 10/17/2016    Lab Results  Component Value Date   CREATININE 0.98 08/22/2018    No results found for: PSA  No results found for: TESTOSTERONE  Lab Results  Component Value Date   HGBA1C 6.7 08/22/2018    Urinalysis    Component Value Date/Time   COLORURINE STRAW (A)  05/22/2015 1431   APPEARANCEUR Cloudy (A) 12/09/2018 1106   LABSPEC 1.004 (L) 05/22/2015 1431   LABSPEC 1.003 01/09/2012 2126   PHURINE 6.0 05/22/2015 1431   GLUCOSEU Trace (A) 12/09/2018 1106   GLUCOSEU Negative 01/09/2012 2126   HGBUR 1+ (A) 05/22/2015 1431   BILIRUBINUR Negative 12/09/2018 1106   BILIRUBINUR Negative 01/09/2012 2126   KETONESUR NEGATIVE 05/22/2015 1431   PROTEINUR Negative 12/09/2018 1106   PROTEINUR NEGATIVE 05/22/2015 1431   NITRITE Negative 12/09/2018 1106   NITRITE NEGATIVE 05/22/2015 1431   LEUKOCYTESUR 1+ (A) 12/09/2018 1106   LEUKOCYTESUR Negative 01/09/2012 2126    Pertinent Imaging:   Assessment & Plan: Urine sent for culture.  Renew Macrodantin.  Recheck her in 6 months because of possible breakthrough  There are no diagnoses linked to this encounter.  No follow-ups on file.  Reece Packer, MD  [place] [address], [place], [place] [address] [phone]

## 2019-02-12 LAB — CULTURE, URINE COMPREHENSIVE

## 2019-02-14 ENCOUNTER — Other Ambulatory Visit: Payer: Self-pay | Admitting: Physician Assistant

## 2019-02-16 ENCOUNTER — Telehealth: Payer: Self-pay | Admitting: Family Medicine

## 2019-02-16 MED ORDER — CEFDINIR 300 MG PO CAPS: 300.0000 mg | ORAL_CAPSULE | Freq: Two times a day (BID) | ORAL | 0 refills | Status: DC

## 2019-02-16 NOTE — Telephone Encounter (Signed)
LMOM for patient to return call. I need pharmacy name to send ABX.

## 2019-02-16 NOTE — Telephone Encounter (Signed)
Pt. States-[facility] on [address].

## 2019-02-16 NOTE — Telephone Encounter (Signed)
ABX sent to pharmacy

## 2019-02-16 NOTE — Telephone Encounter (Signed)
Pt on 400 mg or 200 mg?

## 2019-02-16 NOTE — Telephone Encounter (Signed)
-----   Message from Bjorn Loser, MD sent at 02/13/2019 11:15 AM EDT -----  Carole Civil 300 mg twice a day for 7 days; then go back on daily Macrodantin  ----- Message ----- From: Kyra Manges, CMA Sent: 02/11/2019   2:38 PM EDT To: Bjorn Loser, MD   ----- Message ----- From: Interface, [facility] Lab Results In Sent: 02/09/2019   4:34 PM EDT To: [facility]

## 2019-03-17 ENCOUNTER — Ambulatory Visit (INDEPENDENT_AMBULATORY_CARE_PROVIDER_SITE_OTHER): Payer: Self-pay | Admitting: Physician Assistant

## 2019-03-17 ENCOUNTER — Encounter: Payer: Self-pay | Admitting: Physician Assistant

## 2019-03-17 ENCOUNTER — Other Ambulatory Visit: Payer: Self-pay

## 2019-03-17 VITALS — BP 132/89 | HR 91 | Ht 62.0 in | Wt 150.0 lb

## 2019-03-17 DIAGNOSIS — Z87440 Personal history of urinary (tract) infections: Secondary | ICD-10-CM

## 2019-03-17 DIAGNOSIS — R3915 Urgency of urination: Secondary | ICD-10-CM

## 2019-03-17 DIAGNOSIS — R300 Dysuria: Secondary | ICD-10-CM

## 2019-03-17 DIAGNOSIS — R350 Frequency of micturition: Secondary | ICD-10-CM

## 2019-03-17 LAB — URINALYSIS, COMPLETE
Bilirubin, UA: NEGATIVE
Glucose, UA: NEGATIVE
Ketones, UA: NEGATIVE
Nitrite, UA: NEGATIVE
Protein,UA: NEGATIVE
RBC, UA: NEGATIVE
Specific Gravity, UA: 1.01 (ref 1.005–1.030)
Urobilinogen, Ur: 0.2 mg/dL (ref 0.2–1.0)
pH, UA: 6 (ref 5.0–7.5)

## 2019-03-17 LAB — MICROSCOPIC EXAMINATION: RBC, Urine: NONE SEEN /hpf (ref 0–2)

## 2019-03-17 MED ORDER — URIBEL 118 MG PO CAPS: 1.0000 | ORAL_CAPSULE | Freq: Four times a day (QID) | ORAL | 0 refills | Status: DC | PRN

## 2019-03-17 NOTE — Patient Instructions (Signed)
Please start taking an OTC cranberry supplement for urinary tract health to help with UTI prevention. This should be taken ideally twice daily on an empty stomach. If you develop new bladder pain on this supplement, you may discontinue it.

## 2019-03-17 NOTE — Progress Notes (Signed)
03/17/2019 4:47 PM   Amy Hansen Dec 13, 1955 [id number]  CC: Dysuria, frequency  HPI: Amy Hansen is a 63 y.o. female who presents today for evaluation of possible UTI. She is an established [facility] patient who last saw Dr. Matilde Sprang on 02/09/2019 for follow-up of of IC and recurrent UTIs with a history of hematuria.  She reports a 4-day history of dysuria, malodorous urine, urgency, frequency, nocturia x4-5, nausea, and flank pain. She denies fevers, chills, vomiting, and gross hematuria. She has taken nothing at home for symptom palliation.  She does have a history of recurrent UTI, most recently on 02/09/2019 and treated with Omnicef 363m BID x7 days. She has been on nitrofurantoin daily for prophylaxis for 5 months.  She reports allergies to moxifloxacin and Bactrim.  Patient has failed probiotic supplementation for UTI prevention, reporting that probiotics cause GI upset and unpredictable bowel movements.  She reports that her only symptom of IC is vaginal dryness.  She has been prescribed Premarin cream but reports that she forgets to use it consistently.  She is having daily bowel movements.  In-office UA today positive for 1+ leukocyte esterase; urine microscopy with 11-30 WBCs/HPF.   PMH: Past Medical History:  Diagnosis Date  . Anemia   . Anxiety   . Chronic kidney disease    UTI, hematuria in urine  . Depression   . Diabetes ([place]   . Diverticulosis   . Frequent headaches   . Interstitial cystitis   . Recurrent UTI   . Restless leg syndrome   . Urinary frequency     Surgical History: Past Surgical History:  Procedure Laterality Date  . ABDOMINAL HYSTERECTOMY    . bariatric bypass  2012  . CARPAL TUNNEL RELEASE Right 2003  . CARPAL TUNNEL RELEASE Right    2008  . CHOLECYSTECTOMY  1975  . CYSTOSCOPY W/ RETROGRADES Bilateral 06/06/2015   Procedure: CYSTOSCOPY WITH RETROGRADE PYELOGRAM;  Surgeon: MFestus Aloe MD;  Location: [facility] ORS;  Service: Urology;   Laterality: Bilateral;  . FL INJ LEFT KNEE CT ARTHROGRAM ([facility] HX) Left    1995  . GASTRIC BYPASS  2010  . H[place] 2013  . KNEE ARTHROSCOPY Left 1996  . TONSILLECTOMY      Home Medications:  Allergies as of 03/17/2019      Reactions   Avelox [moxifloxacin Hcl In Nacl] Anaphylaxis   Bactrim [sulfamethoxazole-trimethoprim] Anaphylaxis   Ciprofloxacin Other (See Comments)   Pt states she was told never to take this as it is in the same family as Avelox.    Depakote [divalproex Sodium]    Imitrex [sumatriptan] Other (See Comments)   Neck and shoulder pain   Stadol [butorphanol] Rash      Medication List       Accurate as of March 17, 2019  4:47 PM. If you have any questions, ask your nurse or doctor.        STOP taking these medications   cefdinir 300 MG capsule Commonly known as: OMNICEF Stopped by: SDebroah Loop PA-C   ondansetron 4 MG tablet Commonly known as: Zofran Stopped by: SDebroah Loop PA-C     TAKE these medications   ALPRAZolam 0.5 MG tablet Commonly known as: XANAX TAKE 1 TABLET BY MOUTH EVERY 6 HOURS AS NEEDED FOR ANXIETY   buPROPion 150 MG 24 hr tablet Commonly known as: WELLBUTRIN XL Take 3 tablets (450 mg total) by mouth daily.   butalbital-aspirin-caffeine 50-325-40 MG capsule Commonly known as: FIORINAL Take 1  capsule by mouth daily as needed for headache.   cyclobenzaprine 10 MG tablet Commonly known as: FLEXERIL Take 1 tablet (10 mg total) by mouth 3 (three) times daily as needed for muscle spasms.   estradiol 0.1 MG/GM vaginal cream Commonly known as: ESTRACE VAGINAL Apply 0.35m (pea-sized amount)  just inside the vaginal introitus with a finger-tip on Monday, Wednesday and Friday nights.   fluticasone 50 MCG/ACT nasal spray Commonly known as: FLONASE Place 2 sprays into the nose as needed.   furosemide 20 MG tablet Commonly known as: LASIX Take 1 tablet (20 mg total) by mouth daily.    Galcanezumab-gnlm 120 MG/ML Soaj Commonly known as: Emgality Inject 1 pen into the skin every 30 (thirty) days.   glipiZIDE 5 MG tablet Commonly known as: GLUCOTROL Take 1 tablet (5 mg total) by mouth 2 (two) times daily before a meal.   lamoTRIgine 200 MG tablet Commonly known as: LAMICTAL TAKE 2 TABLETS BY MOUTH AT BEDTIME   metFORMIN 1000 MG tablet Commonly known as: GLUCOPHAGE TAKE 1 TABLET BY MOUTH TWO  TIMES DAILY WITH MEALS   naproxen 500 MG EC tablet Commonly known as: Naproxen DR Take 1 tablet (500 mg total) 2 (two) times daily with a meal by mouth.   nitrofurantoin 100 MG capsule Commonly known as: Macrodantin Take 1 capsule (100 mg total) by mouth daily.   omeprazole 20 MG tablet Commonly known as: PriLOSEC OTC Take 1 tablet (20 mg total) by mouth daily.   ONE TOUCH ULTRA TEST test strip Generic drug: glucose blood daily.   pramipexole 0.5 MG tablet Commonly known as: MIRAPEX Take 1 tablet (0.5 mg total) by mouth at bedtime.   risperiDONE 3 MG tablet Commonly known as: RISPERDAL Take 1 tablet (3 mg total) by mouth at bedtime.   rizatriptan 10 MG tablet Commonly known as: Maxalt Take 1 tablet (10 mg total) by mouth as needed for migraine. May repeat in 2 hours if needed   sertraline 100 MG tablet Commonly known as: ZOLOFT Take 1 tablet (100 mg total) by mouth every morning.   Uribel 118 MG Caps Take 1 capsule (118 mg total) by mouth 4 (four) times daily as needed. Started by: SDebroah Loop PA-C   zolpidem 12.5 MG CR tablet Commonly known as: AMBIEN CR TAKE 1 TABLET BY MOUTH AT BEDTIME AS NEEDED FOR SLEEP       Allergies:  Allergies  Allergen Reactions  . Avelox [Moxifloxacin Hcl In Nacl] Anaphylaxis  . Bactrim [Sulfamethoxazole-Trimethoprim] Anaphylaxis  . Ciprofloxacin Other (See Comments)    Pt states she was told never to take this as it is in the same family as Avelox.   . Depakote [Divalproex Sodium]   . Imitrex [Sumatriptan]  Other (See Comments)    Neck and shoulder pain  . Stadol [Butorphanol] Rash    Family History: Family History  Problem Relation Age of Onset  . Stroke Father   . Heart failure Sister   . Bladder Cancer Neg Hx   . Kidney disease Neg Hx   . Prostate cancer Neg Hx   . Kidney cancer Neg Hx     Social History:   reports that she quit smoking about 43 years ago. Her smoking use included cigarettes. She smoked 0.50 packs per day. She has never used smokeless tobacco. She reports that she does not drink alcohol or use drugs.  ROS: UROLOGY Frequent Urination?: Yes Hard to postpone urination?: No Burning/pain with urination?: Yes Get up at night to urinate?:  No Leakage of urine?: No Urine stream starts and stops?: No Trouble starting stream?: No Do you have to strain to urinate?: No Blood in urine?: No Urinary tract infection?: Yes Sexually transmitted disease?: No Injury to kidneys or bladder?: No Painful intercourse?: No Weak stream?: No Currently pregnant?: No Vaginal bleeding?: No Last menstrual period?: n  Gastrointestinal Nausea?: No Vomiting?: No Indigestion/heartburn?: No Diarrhea?: No Constipation?: No  Constitutional Fever: No Night sweats?: No Weight loss?: No Fatigue?: No  Skin Skin rash/lesions?: No Itching?: No  Eyes Blurred vision?: No Double vision?: No  Ears/Nose/Throat Sore throat?: No Sinus problems?: No  Hematologic/Lymphatic Swollen glands?: No Easy bruising?: No  Cardiovascular Leg swelling?: No Chest pain?: No  Respiratory Cough?: No Shortness of breath?: No  Endocrine Excessive thirst?: No  Musculoskeletal Back pain?: No Joint pain?: No  Neurological Headaches?: Yes Dizziness?: No  Psychologic Depression?: No Anxiety?: No  Physical Exam: BP 132/89 (BP Location: Left Arm, Patient Position: Sitting, Cuff Size: Normal)   Pulse 91   Ht 5' 2"  (1.575 m)   Wt 150 lb (68 kg)   LMP  (LMP Unknown)   BMI 27.44 kg/m    Constitutional:  Alert and oriented, no acute distress, nontoxic appearing HEENT: [place], AT Cardiovascular: No clubbing, cyanosis, or edema Respiratory: Normal respiratory effort, no increased work of breathing Skin: No rashes, bruises or suspicious lesions Neurologic: Grossly intact, no focal deficits, moving all 4 extremities Psychiatric: Normal mood and affect  Laboratory Data: Results for orders placed or performed in visit on 03/17/19  Microscopic Examination   URINE  Result Value Ref Range   WBC, UA 11-30 (A) 0 - 5 /hpf   RBC None seen 0 - 2 /hpf   Epithelial Cells (non renal) 0-10 0 - 10 /hpf   Renal Epithel, UA 0-10 (A) None seen /hpf   Bacteria, UA Few None seen/Few  Urinalysis, Complete  Result Value Ref Range   Specific Gravity, UA 1.010 1.005 - 1.030   pH, UA 6.0 5.0 - 7.5   Color, UA Yellow Yellow   Appearance Ur Clear Clear   Leukocytes,UA 1+ (A) Negative   Protein,UA Negative Negative/Trace   Glucose, UA Negative Negative   Ketones, UA Negative Negative   RBC, UA Negative Negative   Bilirubin, UA Negative Negative   Urobilinogen, Ur 0.2 0.2 - 1.0 mg/dL   Nitrite, UA Negative Negative   Microscopic Examination See below:    Assessment & Plan:   1. Recurrent UTI Patient with symptoms consistent with acute UTI, however UA nonspecific.  Upon chart review, prior UTIs this year have been associated with more grossly positive UAs.  Given her medication allergies and history of frequent urinary tract infections, I am hesitant to initiate antibiotic therapy today.  I counseled patient that I believe would be more prudent to await the results of urine culture.  She agrees with this plan.  We will prescribe 5 days of Uribel and await urine culture results.  In the meantime, I encouraged patient to initiate cranberry supplementation to add to her UTI prophylaxis regimen. - Urinalysis, Complete - Urine culture - Uribel 118 mg up to 4 times daily PRN  2.  History of cystitis  Patient's only reported complaint associated with her history of IC is vaginal dryness.  This seems more consistent with atrophic vaginitis.  She has been prescribed Premarin cream.  I reminded her that I expect consistent use of this would help with her dryness and with prevention of her recurrent UTIs.  She expressed an understanding of this plan but admitted that it is easy to forget to use this on a regular basis.  I made the patient aware that cranberry supplementation for my recommendation above may exacerbate bladder pain.  I advised her that if she begins to have increased bladder pain with initiation of the supplement, that she should stop it.  Debroah Loop, PA-C  [facility] [address] [place], [place] [address] [phone]

## 2019-03-20 ENCOUNTER — Telehealth: Payer: Self-pay | Admitting: Physician Assistant

## 2019-03-20 ENCOUNTER — Other Ambulatory Visit: Payer: Self-pay | Admitting: Physician Assistant

## 2019-03-20 LAB — CULTURE, URINE COMPREHENSIVE

## 2019-03-20 MED ORDER — CEFDINIR 300 MG PO CAPS: 300.0000 mg | ORAL_CAPSULE | Freq: Two times a day (BID) | ORAL | 0 refills | Status: AC

## 2019-03-20 MED ORDER — CEPHALEXIN 500 MG PO CAPS: 500.0000 mg | ORAL_CAPSULE | Freq: Two times a day (BID) | ORAL | 0 refills | Status: AC

## 2019-03-20 NOTE — Telephone Encounter (Signed)
Patient notified

## 2019-03-20 NOTE — Telephone Encounter (Signed)
LMOM for patient to return call.

## 2019-03-20 NOTE — Telephone Encounter (Signed)
Please contact the patient and inform her that the preliminary results of her urine culture indicate that she does have a urinary tract infection.  I have prescribed her an antibiotic and sent it to her pharmacy.  Please advise her to pick this up today and start it as soon as possible.  If further data indicates that we need to adjust treatment, I will contact her early next week.  Additionally, please advise her that she should not take her daily prophylactic antibiotic while she is on this medication.  She can resume the nitrofurantoin when she completes this new antibiotic.

## 2019-03-20 NOTE — Telephone Encounter (Signed)
Please contact patient again and inform her that the sensitivities report on her urine culture returned this afternoon.  The bacteria that grew out is resistant to the Keflex that I prescribed.  I have sent in a different order for [place] that she should start instead.

## 2019-03-23 NOTE — Telephone Encounter (Signed)
Patient notified

## 2019-03-25 ENCOUNTER — Other Ambulatory Visit: Payer: Self-pay | Admitting: Family Medicine

## 2019-03-25 DIAGNOSIS — E119 Type 2 diabetes mellitus without complications: Secondary | ICD-10-CM

## 2019-03-25 NOTE — Telephone Encounter (Signed)
Requested medication (s) are due for refill today: yes  Requested medication (s) are on the active medication list: yes  Last refill:  05/23/2018  Future visit scheduled: no  Notes to clinic:  Per protocol unable to refill   Requested Prescriptions  Pending Prescriptions Disp Refills   glipiZIDE (GLUCOTROL) 5 MG tablet [Pharmacy Med Name: glipiZIDE 5 MG Oral Tablet] 180 tablet 0    Sig: TAKE 1 TABLET BY MOUTH TWICE DAILY WITH A MEAL     Endocrinology:  Diabetes - Sulfonylureas Failed - 03/25/2019  5:31 AM      Failed - HBA1C is between 0 and 7.9 and within 180 days    Hemoglobin A1C  Date Value Ref Range Status  03/21/2016 7.3%  Final   HB A1C (BAYER DCA - WAIVED)  Date Value Ref Range Status  08/22/2018 6.7 <7.0 % Final    Comment:                                          Diabetic Adult            <7.0                                       Healthy Adult        4.3 - 5.7                                                           (DCCT/NGSP) American Diabetes Association's Summary of Glycemic Recommendations for Adults with Diabetes: Hemoglobin A1c <7.0%. More stringent glycemic goals (A1c <6.0%) may further reduce complications at the cost of increased risk of hypoglycemia.          Failed - Valid encounter within last 6 months    Recent Outpatient Visits          6 months ago Migraine without aura and without status migrainosus, not intractable   [place], [place], [place]   7 months ago Type 2 diabetes mellitus without complication, without long-term current use of insulin [facility])   [facility], [place], [place]   8 months ago Migraine without aura and without status migrainosus, not intractable   [facility] Mer[facility] Roof [place], [place]   10 months ago Lower urinary tract symptoms (LUTS)   [facility] [place], [place]   11 months ago Controlled type 2 diabetes mellitus without  complication, without long-term current use of insulin ([place])   [facility] Kathrine Haddock, NP              metFORMIN (GLUCOPHAGE) 1000 MG tablet [Pharmacy Med Name: metFORMIN HCl 1000 MG Oral Tablet] 180 tablet 0    Sig: TAKE 1 TABLET BY MOUTH TWICE DAILY WITH MEALS     Endocrinology:  Diabetes - Biguanides Failed - 03/25/2019  5:31 AM      Failed - HBA1C is between 0 and 7.9 and within 180 days    Hemoglobin A1C  Date Value Ref Range Status  03/21/2016 7.3%  Final   HB A1C (BAYER DCA - WAIVED)  Date  Value Ref Range Status  08/22/2018 6.7 <7.0 % Final    Comment:                                          Diabetic Adult            <7.0                                       Healthy Adult        4.3 - 5.7                                                           (DCCT/NGSP) American Diabetes Association's Summary of Glycemic Recommendations for Adults with Diabetes: Hemoglobin A1c <7.0%. More stringent glycemic goals (A1c <6.0%) may further reduce complications at the cost of increased risk of hypoglycemia.          Failed - Valid encounter within last 6 months    Recent Outpatient Visits          6 months ago Migraine without aura and without status migrainosus, not intractable   [place], [place], [place]   7 months ago Type 2 diabetes mellitus without complication, without long-term current use of insulin [facility])   [facility], [place], [place]   8 months ago Migraine without aura and without status migrainosus, not intractable   [facility] Merrie Roof [place], [place]   10 months ago Lower urinary tract symptoms (LUTS)   [facility] Merrie Roof [place], [place]   11 months ago Controlled type 2 diabetes mellitus without complication, without long-term current use of insulin ([place])   [facility] Kathrine Haddock, NP             Passed - Cr in normal range and within 360  days    Creatinine  Date Value Ref Range Status  01/10/2012 0.58 (L) 0.60 - 1.30 mg/dL Final   Creatinine, Ser  Date Value Ref Range Status  08/22/2018 0.98 0.57 - 1.00 mg/dL Final         Passed - eGFR in normal range and within 360 days    EGFR (African American)  Date Value Ref Range Status  01/10/2012 >60  Final   GFR calc Af Amer  Date Value Ref Range Status  08/22/2018 72 >59 mL/min/1.73 Final   EGFR (Non-African Amer.)  Date Value Ref Range Status  01/10/2012 >60  Final    Comment:    eGFR values <24m/min/1.73 m2 may be an indication of chronic kidney disease (CKD). Calculated eGFR is useful in patients with stable renal function. The eGFR calculation will not be reliable in acutely ill patients when serum creatinine is changing rapidly. It is not useful in  patients on dialysis. The eGFR calculation may not be applicable to patients at the low and high extremes of body sizes, pregnant women, and vegetarians.    GFR calc non Af Amer  Date Value Ref Range Status  08/22/2018 62 >59 mL/min/1.73 Final

## 2019-03-28 ENCOUNTER — Other Ambulatory Visit: Payer: Self-pay | Admitting: Physician Assistant

## 2019-04-03 ENCOUNTER — Other Ambulatory Visit: Payer: Self-pay | Admitting: Physician Assistant

## 2019-04-05 ENCOUNTER — Other Ambulatory Visit: Payer: Self-pay | Admitting: Physician Assistant

## 2019-04-21 ENCOUNTER — Other Ambulatory Visit: Payer: Self-pay

## 2019-04-21 ENCOUNTER — Encounter: Payer: Self-pay | Admitting: Physician Assistant

## 2019-04-21 ENCOUNTER — Ambulatory Visit (INDEPENDENT_AMBULATORY_CARE_PROVIDER_SITE_OTHER): Payer: BLUE CROSS/BLUE SHIELD | Admitting: Physician Assistant

## 2019-04-21 VITALS — BP 113/75 | HR 103 | Ht 62.0 in | Wt 147.0 lb

## 2019-04-21 DIAGNOSIS — R300 Dysuria: Secondary | ICD-10-CM

## 2019-04-21 DIAGNOSIS — Z87440 Personal history of urinary (tract) infections: Secondary | ICD-10-CM

## 2019-04-21 LAB — URINALYSIS, COMPLETE
Bilirubin, UA: NEGATIVE
Glucose, UA: NEGATIVE
Nitrite, UA: POSITIVE — AB
Protein,UA: NEGATIVE
RBC, UA: NEGATIVE
Specific Gravity, UA: 1.015 (ref 1.005–1.030)
Urobilinogen, Ur: 0.2 mg/dL (ref 0.2–1.0)
pH, UA: 5.5 (ref 5.0–7.5)

## 2019-04-21 LAB — MICROSCOPIC EXAMINATION: RBC, Urine: NONE SEEN /hpf (ref 0–2)

## 2019-04-21 NOTE — Progress Notes (Signed)
04/21/2019 11:46 AM   Amy Hansen 04/27/1956 [id number]  CC: Dysuria, frequency  HPI: Amy Hansen is a 63 y.o. female who presents today for evaluation of possible UTI. She is an established [facility] patient who last saw me on 03/17/2019 for evaluation of possible UTI.  Urine culture with multi-drug-resistant E. coli, treated with Omnicef 300 mg twice daily x5 days.  She reports a 3-week history of dysuria, nausea, flank pain, and frequency. She denies vomiting, fevers, chills, and gross hematuria. She has taken Uribel at home with adequate symptom palliation.  She does have a history of recurrent UTI. She takes nitrofurantoin 100 mg daily for prophylaxis.  She has been prescribed Premarin estrogen cream, but reports she forgets to use it consistently and so she has not used it in "quite some time."  She has failed oral probiotic therapy.  Of note, patient was started on Macrobid prophylaxis in March 2020.  Today would represent her fifth urinary tract infection since being on the suppressive antibiotic.  In-office UA today positive for nitrites and 1+ leukocyte esterase; urine microscopy with 11-30 WBCs/HPF and moderate bacteria.  PMH: Past Medical History:  Diagnosis Date  . Anemia   . Anxiety   . Chronic kidney disease    UTI, hematuria in urine  . Depression   . Diabetes ([place])   . Diverticulosis   . Frequent headaches   . Interstitial cystitis   . Recurrent UTI   . Restless leg syndrome   . Urinary frequency     Surgical History: Past Surgical History:  Procedure Laterality Date  . ABDOMINAL HYSTERECTOMY    . bariatric bypass  2012  . CARPAL TUNNEL RELEASE Right 2003  . CARPAL TUNNEL RELEASE Right    2008  . CHOLECYSTECTOMY  1975  . CYSTOSCOPY W/ RETROGRADES Bilateral 06/06/2015   Procedure: CYSTOSCOPY WITH RETROGRADE PYELOGRAM;  Surgeon: Festus Aloe, MD;  Location: [facility] ORS;  Service: Urology;  Laterality: Bilateral;  . FL INJ LEFT KNEE CT ARTHROGRAM ([facility]  HX) Left    1995  . GASTRIC BYPASS  2010  . [place]  2013  . KNEE ARTHROSCOPY Left 1996  . TONSILLECTOMY      Home Medications:  Allergies as of 04/21/2019      Reactions   Avelox [moxifloxacin Hcl In Nacl] Anaphylaxis   Bactrim [sulfamethoxazole-trimethoprim] Anaphylaxis   Ciprofloxacin Other (See Comments)   Pt states she was told never to take this as it is in the same family as Avelox.    Depakote [divalproex Sodium]    Imitrex [sumatriptan] Other (See Comments)   Neck and shoulder pain   Stadol [butorphanol] Rash      Medication List       Accurate as of April 21, 2019 11:59 PM. If you have any questions, ask your nurse or doctor.        ALPRAZolam 0.5 MG tablet Commonly known as: XANAX TAKE 1 TABLET BY MOUTH EVERY 6 HOURS AS NEEDED FOR ANXIETY   buPROPion 150 MG 24 hr tablet Commonly known as: WELLBUTRIN XL Take 3 tablets by mouth once daily   butalbital-aspirin-caffeine 50-325-40 MG capsule Commonly known as: FIORINAL Take 1 capsule by mouth daily as needed for headache.   cyclobenzaprine 10 MG tablet Commonly known as: FLEXERIL Take 1 tablet (10 mg total) by mouth 3 (three) times daily as needed for muscle spasms.   estradiol 0.1 MG/GM vaginal cream Commonly known as: ESTRACE VAGINAL Apply 0.94m (pea-sized amount)  just inside the  vaginal introitus with a finger-tip on Monday, Wednesday and Friday nights.   fluticasone 50 MCG/ACT nasal spray Commonly known as: FLONASE Place 2 sprays into the nose as needed.   furosemide 20 MG tablet Commonly known as: LASIX Take 1 tablet (20 mg total) by mouth daily.   Galcanezumab-gnlm 120 MG/ML Soaj Commonly known as: Emgality Inject 1 pen into the skin every 30 (thirty) days.   glipiZIDE 5 MG tablet Commonly known as: GLUCOTROL TAKE 1 TABLET BY MOUTH TWICE DAILY WITH A MEAL   lamoTRIgine 200 MG tablet Commonly known as: LAMICTAL TAKE 2 TABLETS BY MOUTH AT BEDTIME   metFORMIN 1000 MG tablet  Commonly known as: GLUCOPHAGE TAKE 1 TABLET BY MOUTH TWICE DAILY WITH MEALS   naproxen 500 MG EC tablet Commonly known as: Naproxen DR Take 1 tablet (500 mg total) 2 (two) times daily with a meal by mouth.   nitrofurantoin 100 MG capsule Commonly known as: Macrodantin Take 1 capsule (100 mg total) by mouth daily.   omeprazole 20 MG tablet Commonly known as: PriLOSEC OTC Take 1 tablet (20 mg total) by mouth daily.   ONE TOUCH ULTRA TEST test strip Generic drug: glucose blood daily.   pramipexole 0.5 MG tablet Commonly known as: MIRAPEX Take 1 tablet (0.5 mg total) by mouth at bedtime.   risperiDONE 3 MG tablet Commonly known as: RISPERDAL Take 1 tablet (3 mg total) by mouth at bedtime.   rizatriptan 10 MG tablet Commonly known as: Maxalt Take 1 tablet (10 mg total) by mouth as needed for migraine. May repeat in 2 hours if needed   sertraline 100 MG tablet Commonly known as: ZOLOFT TAKE 1 TABLET BY MOUTH IN THE MORNING   Uribel 118 MG Caps Take 1 capsule (118 mg total) by mouth 4 (four) times daily as needed.   zolpidem 12.5 MG CR tablet Commonly known as: AMBIEN CR TAKE 1 TABLET BY MOUTH AT BEDTIME AS NEEDED FOR SLEEP       Allergies:  Allergies  Allergen Reactions  . Avelox [Moxifloxacin Hcl In Nacl] Anaphylaxis  . Bactrim [Sulfamethoxazole-Trimethoprim] Anaphylaxis  . Ciprofloxacin Other (See Comments)    Pt states she was told never to take this as it is in the same family as Avelox.   . Depakote [Divalproex Sodium]   . Imitrex [Sumatriptan] Other (See Comments)    Neck and shoulder pain  . Stadol [Butorphanol] Rash    Family History: Family History  Problem Relation Age of Onset  . Stroke Father   . Heart failure Sister   . Bladder Cancer Neg Hx   . Kidney disease Neg Hx   . Prostate cancer Neg Hx   . Kidney cancer Neg Hx     Social History:   reports that she quit smoking about 44 years ago. Her smoking use included cigarettes. She smoked  0.50 packs per day. She has never used smokeless tobacco. She reports that she does not drink alcohol or use drugs.  ROS: UROLOGY Frequent Urination?: Yes Hard to postpone urination?: Yes Burning/pain with urination?: Yes Get up at night to urinate?: Yes Leakage of urine?: No Urine stream starts and stops?: No Trouble starting stream?: No Do you have to strain to urinate?: No Blood in urine?: No Urinary tract infection?: Yes Sexually transmitted disease?: No Injury to kidneys or bladder?: No Painful intercourse?: No Weak stream?: No Currently pregnant?: No Vaginal bleeding?: No Last menstrual period?: n  Gastrointestinal Nausea?: No Vomiting?: No Indigestion/heartburn?: No Diarrhea?: No Constipation?: No  Constitutional Fever: No Night sweats?: No Weight loss?: No Fatigue?: No  Skin Skin rash/lesions?: No Itching?: No  Eyes Blurred vision?: No Double vision?: No  Ears/Nose/Throat Sore throat?: No Sinus problems?: No  Hematologic/Lymphatic Swollen glands?: No Easy bruising?: No  Cardiovascular Leg swelling?: No Chest pain?: No  Respiratory Cough?: No Shortness of breath?: No  Endocrine Excessive thirst?: No  Musculoskeletal Back pain?: Yes Joint pain?: No  Neurological Headaches?: Yes Dizziness?: No  Psychologic Depression?: No Anxiety?: No  Physical Exam: BP 113/75 (BP Location: Left Arm, Patient Position: Sitting, Cuff Size: Normal)   Pulse (!) 103   Ht 5' 2"  (1.575 m)   Wt 147 lb (66.7 kg)   LMP  (LMP Unknown)   BMI 26.89 kg/m   Constitutional:  Alert and oriented, no acute distress, nontoxic appearing HEENT: [place], AT Cardiovascular: No clubbing, cyanosis, or edema Respiratory: Normal respiratory effort, no increased work of breathing Skin: No rashes, bruises or suspicious lesions Neurologic: Grossly intact, no focal deficits, moving all 4 extremities Psychiatric: Normal mood and affect  Laboratory Data: Results for orders  placed or performed in visit on 04/21/19  Microscopic Examination   URINE  Result Value Ref Range   WBC, UA 11-30 (A) 0 - 5 /hpf   RBC None seen 0 - 2 /hpf   Epithelial Cells (non renal) 0-10 0 - 10 /hpf   Renal Epithel, UA 0-10 (A) None seen /hpf   Bacteria, UA Moderate (A) None seen/Few  Urinalysis, Complete  Result Value Ref Range   Specific Gravity, UA 1.015 1.005 - 1.030   pH, UA 5.5 5.0 - 7.5   Color, UA Green (A) Yellow   Appearance Ur Hazy (A) Clear   Leukocytes,UA 1+ (A) Negative   Protein,UA Negative Negative/Trace   Glucose, UA Negative Negative   Ketones, UA Trace (A) Negative   RBC, UA Negative Negative   Bilirubin, UA Negative Negative   Urobilinogen, Ur 0.2 0.2 - 1.0 mg/dL   Nitrite, UA Positive (A) Negative   Microscopic Examination See below:    Assessment & Plan:   1. Dysuria Patient with symptoms and UA consistent with acute cystitis without hematuria. Will send for culture. Will defer treatment today given drug allergies and recent culture resistance patterns. I advised her to stay hydrated and take Uribel for symptom palliation until I call her with her results and initiate treatment later this week. She expressed understanding. - Urinalysis, Complete - CULTURE, URINE COMPREHENSIVE  2. History of recurrent UTI (urinary tract infection) I advised her to set an alarm on her phone to remind her to use her Premarin cream 3 times weekly. I counseled her that it may take several weeks of consistent use for her to see benefit of this therapy. Additionally, I counseled her to stay well hydrated and start cranberry supplementation to augment her rUTI prophylaxis regimen.  Debroah Loop, PA-C  [facility] [address], [place], [place] [address] [phone]

## 2019-04-21 NOTE — Patient Instructions (Signed)
Recurrent UTI Prevention Strategies  1. Stay well hydrated. 2. Start taking an over-the-counter cranberry supplement for urinary tract health. Take this once or twice daily on an empty stomach, e.g. right before bed. 3. Start vaginal estrogen cream. Apply a pea-sized amount around the urethra three times weekly. Set an alarm on your phone to remember to take this.

## 2019-04-24 LAB — CULTURE, URINE COMPREHENSIVE

## 2019-04-27 ENCOUNTER — Other Ambulatory Visit: Payer: Self-pay | Admitting: Physician Assistant

## 2019-04-27 ENCOUNTER — Telehealth: Payer: Self-pay | Admitting: Physician Assistant

## 2019-04-27 MED ORDER — CEFDINIR 300 MG PO CAPS: 300.0000 mg | ORAL_CAPSULE | Freq: Two times a day (BID) | ORAL | 0 refills | Status: AC

## 2019-04-27 NOTE — Progress Notes (Signed)
Omnicef 383m BID x5 days sent to pharmacy. VMOM to return call to inform pt.

## 2019-04-27 NOTE — Telephone Encounter (Signed)
Pt states that she is returning a call about lab results, I did not see any notes from today. I did see where Carole Civil was called in for her and I told her that, she would like a call back to discuss. She states that she is at work and a VM will need to be left.

## 2019-04-27 NOTE — Telephone Encounter (Signed)
I left a message for patient. Did not see any results in her chart. She will call back with any questions.,

## 2019-04-28 ENCOUNTER — Telehealth: Payer: Self-pay | Admitting: Physician Assistant

## 2019-04-28 NOTE — Telephone Encounter (Signed)
Patient's urine culture returned yesterday morning indicating a necessary change in antibiotic therapy for her urinary tract infection.  Prescription sent to her pharmacy.  I left 2 voicemails at her listed number since yesterday in an attempt to explain this to her.  So far I have not heard back from her.

## 2019-04-30 NOTE — Telephone Encounter (Signed)
I just spoke with the patient via telephone.  I explained that I needed to change her antibiotic based on the results of her urine culture as previously described.  She expressed understanding.

## 2019-05-04 ENCOUNTER — Other Ambulatory Visit: Payer: Self-pay | Admitting: Physician Assistant

## 2019-05-04 NOTE — Telephone Encounter (Signed)
Patient notified and voiced understanding.

## 2019-05-04 NOTE — Telephone Encounter (Signed)
Pt called and states that [facility] states that they have not received a new Rx for ABX.

## 2019-05-04 NOTE — Telephone Encounter (Signed)
Unable to leave message for patient. Mailbox is full. The medication she is suppose to be on is Omnicef. She does not need to change this medication. She needs to complete the course.

## 2019-05-09 ENCOUNTER — Other Ambulatory Visit: Payer: Self-pay | Admitting: Family Medicine

## 2019-05-09 NOTE — Telephone Encounter (Signed)
Forwarding medication refill request to PCP for review. 

## 2019-05-10 ENCOUNTER — Other Ambulatory Visit: Payer: Self-pay | Admitting: Physician Assistant

## 2019-05-11 ENCOUNTER — Ambulatory Visit: Payer: Self-pay | Admitting: Physician Assistant

## 2019-06-16 ENCOUNTER — Other Ambulatory Visit: Payer: Self-pay | Admitting: Physician Assistant

## 2019-06-16 NOTE — Telephone Encounter (Signed)
Due back this month nothing set up yet

## 2019-06-19 ENCOUNTER — Other Ambulatory Visit: Payer: Self-pay | Admitting: Gastroenterology

## 2019-06-19 ENCOUNTER — Ambulatory Visit
Admission: RE | Admit: 2019-06-19 | Discharge: 2019-06-19 | Disposition: A | Discharge status: Home / Self Care | Payer: Self-pay | Source: Ambulatory Visit | Attending: Gastroenterology | Admitting: Gastroenterology

## 2019-06-19 ENCOUNTER — Other Ambulatory Visit: Payer: Self-pay

## 2019-06-19 DIAGNOSIS — R197 Diarrhea, unspecified: Secondary | ICD-10-CM

## 2019-06-19 DIAGNOSIS — R1032 Left lower quadrant pain: Secondary | ICD-10-CM

## 2019-06-19 DIAGNOSIS — Z8719 Personal history of other diseases of the digestive system: Secondary | ICD-10-CM

## 2019-06-19 DIAGNOSIS — R110 Nausea: Secondary | ICD-10-CM

## 2019-06-19 IMAGING — CT CT ABD-PELV W/ CM
2 of 5 series · 16 of 46 positions shown, 18 images · IV contrast (APPLIED)
Comparison: CT stone study [DATE]

CLINICAL DATA: Left lower quadrant pain.

EXAM:
CT ABDOMEN AND PELVIS WITH CONTRAST
TECHNIQUE: Multidetector CT imaging of the abdomen and pelvis was performed
using the standard protocol following bolus administration of
intravenous contrast.
CONTRAST:  100mL OMNIPAQUE IOHEXOL 300 MG/ML  SOLN

[Series 2: routine abd/pel with · axial · 0.78mm/px · z∈[-337,+63]mm · 13 of 91 slices shown, 15 images]
[im 6/91  soft-tissue]
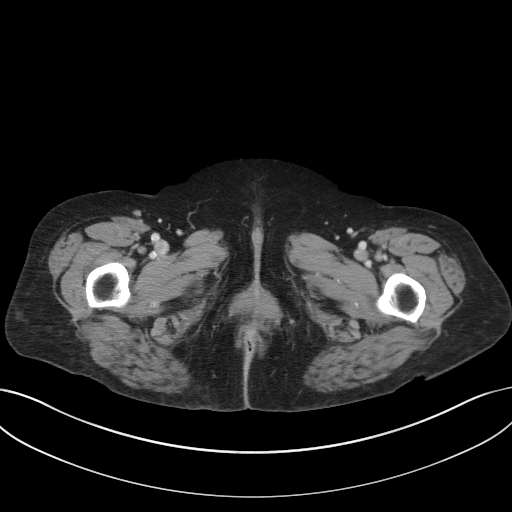
[im 6/91  bone]
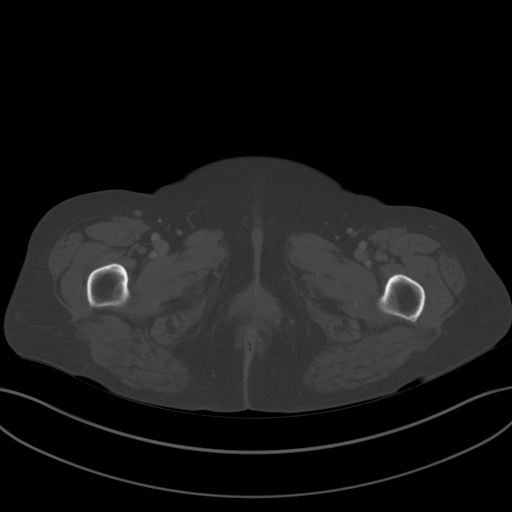
[im 11/91  soft-tissue]
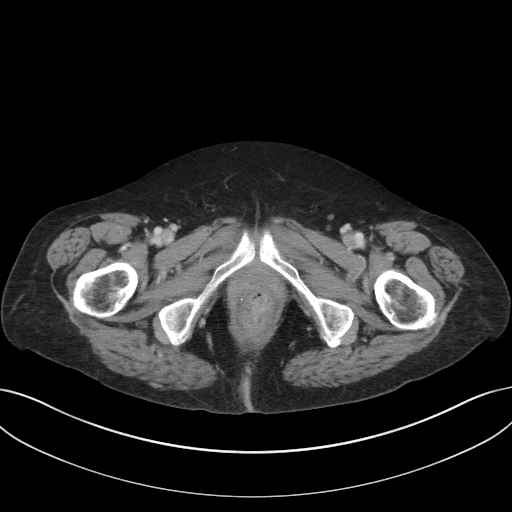
[im 21/91  soft-tissue]
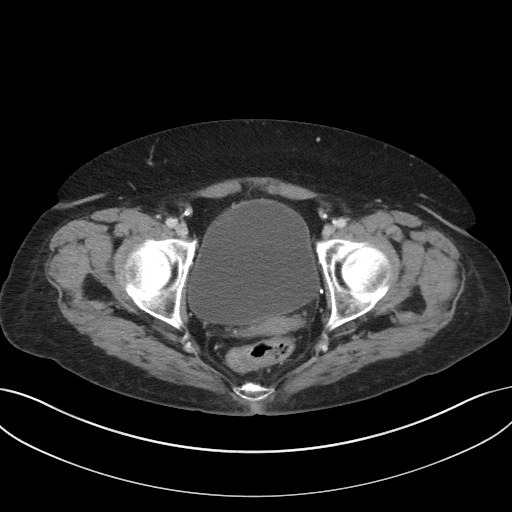
[im 26/91  soft-tissue]
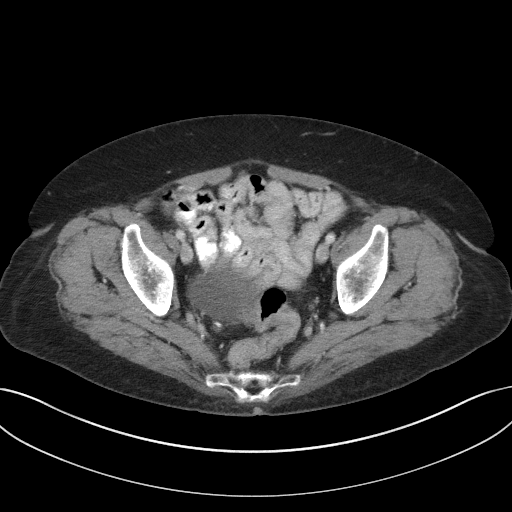
[im 31/91  soft-tissue]
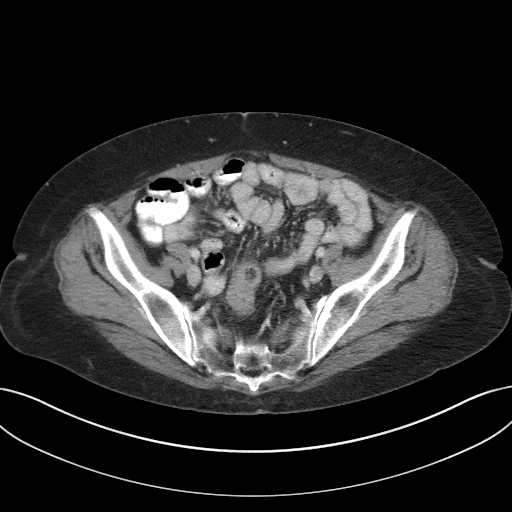
[im 41/91  soft-tissue]
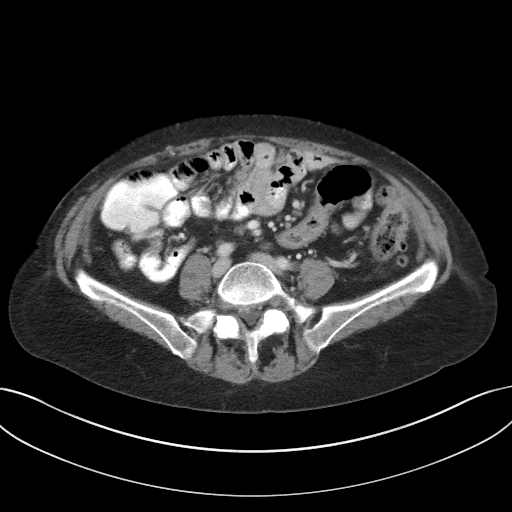
[im 46/91  soft-tissue]
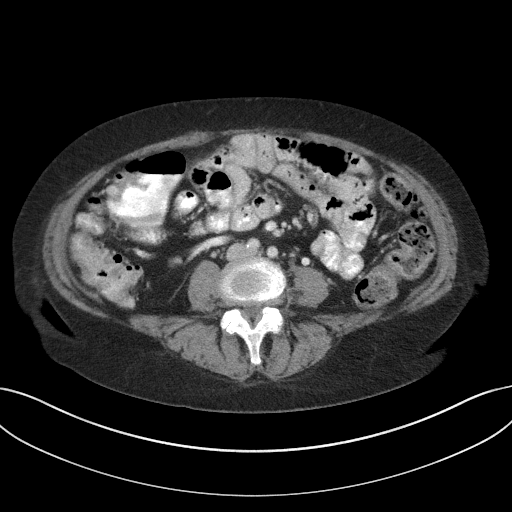
[im 51/91  soft-tissue]
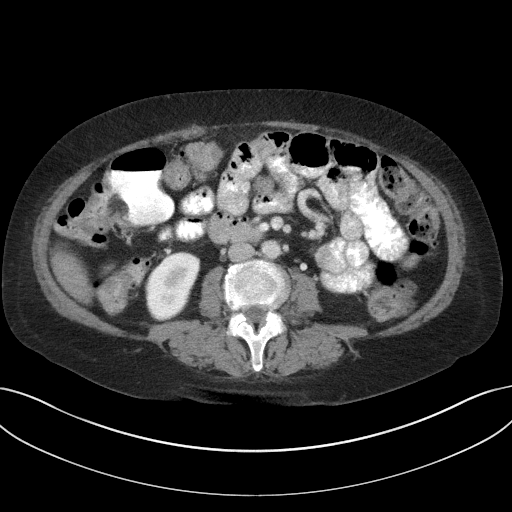
[im 61/91  soft-tissue]
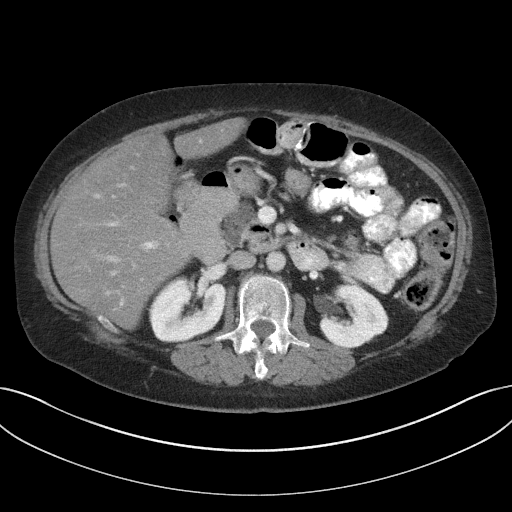
[im 61/91  bone]
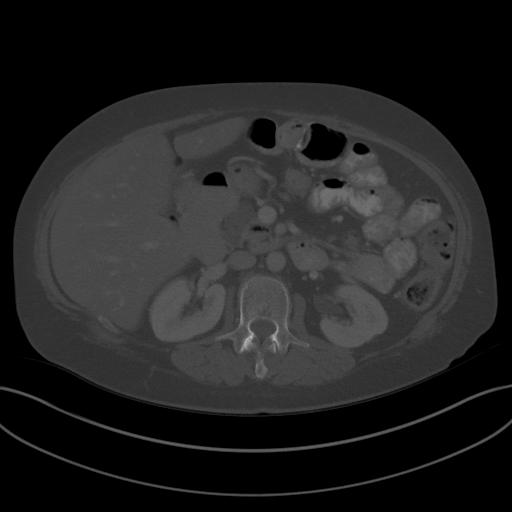
[im 66/91  soft-tissue]
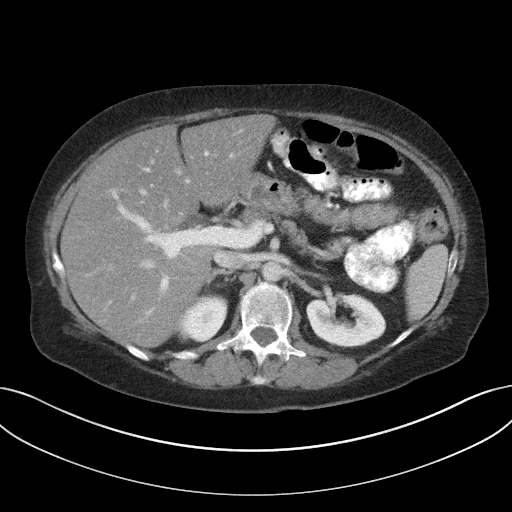
[im 71/91  soft-tissue]
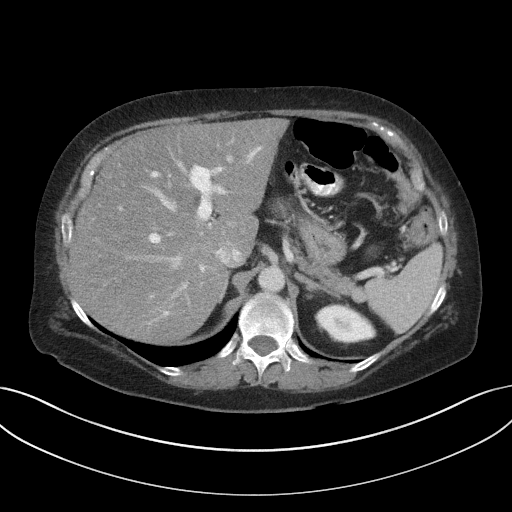
[im 81/91  soft-tissue]
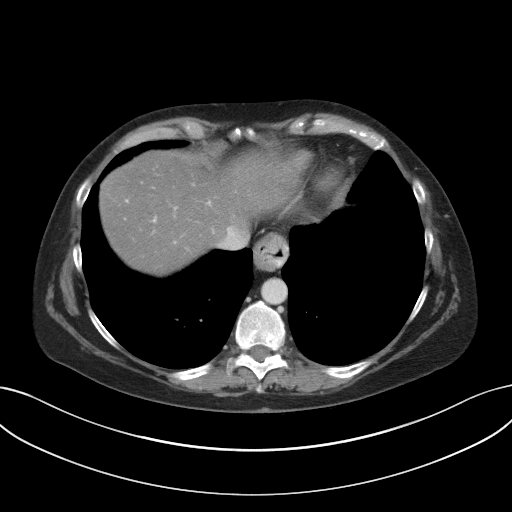
[im 86/91  soft-tissue]
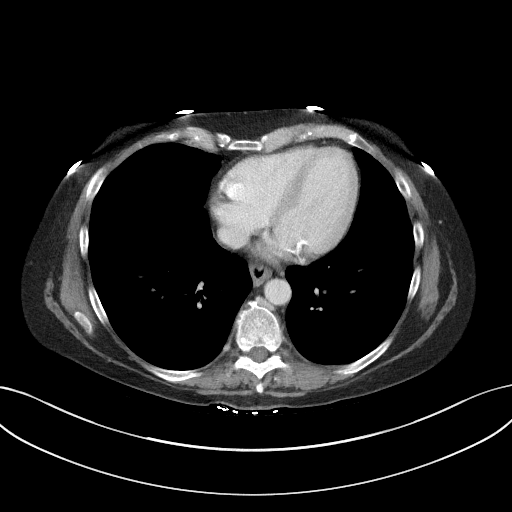

[Series 6: coronal st · coronal · 0.79mm/px · 3 of 88 slices shown]
[im 30/88  soft-tissue]
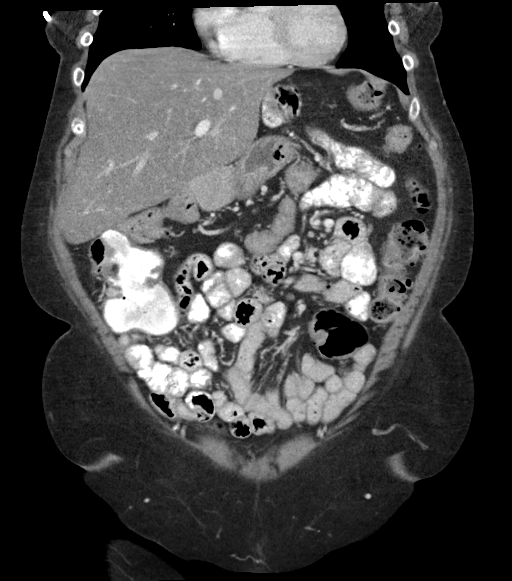
[im 39/88  soft-tissue]
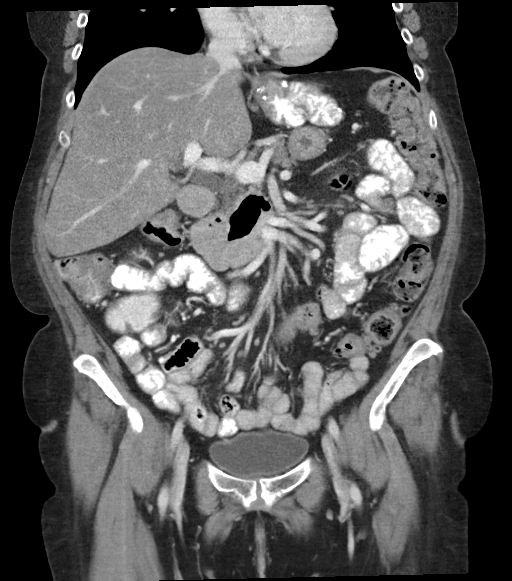
[im 49/88  soft-tissue]
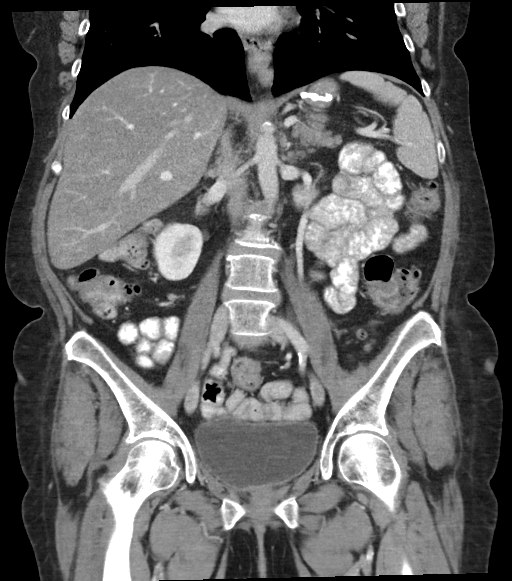

[16 of 46 positions shown; findings below may reference images not displayed]

FINDINGS: Lower chest: Tiny pulmonary nodules in the lung bases measure up to
5 mm (right lower lobe image 7/series 4) stable in the interval
consistent with benign etiology.

Hepatobiliary: The liver shows diffusely decreased attenuation
suggesting fat deposition. 9 mm low-density lesion lateral segment
left liver on [DATE] is stable since prior consistent with benign
etiology. Gallbladder surgically absent. No intrahepatic or
extrahepatic biliary dilation.

Pancreas: No focal mass lesion. No dilatation of the main duct. No
intraparenchymal cyst. No peripancreatic edema.

Spleen: No splenomegaly. No focal mass lesion.

Adrenals/Urinary Tract: No adrenal nodule or mass. Kidneys
unremarkable. No evidence for hydroureter. The urinary bladder
appears normal for the degree of distention.

Stomach/Bowel: Tiny hiatal hernia. Status post gastric bypass. Roux
limb is nondilated. Afferent limb is nondilated. No small bowel wall
thickening. No small bowel dilatation. The terminal ileum is normal.
The appendix is not visualized, but there is no edema or
inflammation in the region of the cecum. Diverticular changes are
noted in the left colon without diverticulitis. Focal area of
circumferential wall thickening is identified in the proximal
sigmoid colon (image 55/series 2). While suspicious appearing on
axial imaging, coronal imaging (38/6) is less so period.

Vascular/Lymphatic: There is abdominal aortic atherosclerosis
without aneurysm. There is no gastrohepatic or hepatoduodenal
ligament lymphadenopathy. No intraperitoneal or retroperitoneal
lymphadenopathy. No pelvic sidewall lymphadenopathy.

Reproductive: There is no adnexal mass.

Other: No intraperitoneal free fluid.

Musculoskeletal: No worrisome lytic or sclerotic osseous
abnormality. Superior endplate compression deformity at T12 is age
indeterminate but likely nonacute. This is new in the interval since
[DATE].
IMPRESSION: 1. No findings to explain the patient's history of left lower
quadrant pain. Specifically, no changes of diverticulitis in this
patient with left colonic diverticulosis.
2. Short segment of apparent wall thickening in the proximal sigmoid
colon. Likely peristalsis, but correlation with colorectal cancer
screening history recommended as neoplasm not entirely excluded.
3. Status post gastric bypass.
4.  Aortic Atherosclerois ([WS]-170.0)

## 2019-06-19 MED ORDER — IOHEXOL 300 MG/ML  SOLN
100.0000 mL | Freq: Once | INTRAMUSCULAR | Status: AC | PRN
  Administered 2019-06-19: 100 mL via INTRAVENOUS

## 2019-06-23 ENCOUNTER — Other Ambulatory Visit: Payer: Self-pay | Admitting: Family Medicine

## 2019-06-23 NOTE — Telephone Encounter (Signed)
Routing to provider  

## 2019-06-23 NOTE — Telephone Encounter (Signed)
Overdue for f/u, was already given courtesy fill. Please get her scheduled. I will give 2 weeks because it's the holiday week and likely she won't be able to get an appt right away

## 2019-06-23 NOTE — Telephone Encounter (Signed)
LVM To make medication refill appointment and to notify of refills.

## 2019-06-23 NOTE — Telephone Encounter (Signed)
Requested medication (s) are due for refill today: yes  Requested medication (s) are on the active medication list: yes  Last refill:  05/11/2019  Future visit scheduled: no  Notes to clinic:  Overdue for office visit  Review for refill   Requested Prescriptions  Pending Prescriptions Disp Refills   glipiZIDE (GLUCOTROL) 5 MG tablet [Pharmacy Med Name: glipiZIDE 5 MG Oral Tablet] 28 tablet 0    Sig: TAKE 1 TABLET BY MOUTH TWICE DAILY WITH MEALS (NEEDS  APPOINTMENT  FOR  FURTHER  REFILLS)     Endocrinology:  Diabetes - Sulfonylureas Failed - 06/23/2019  6:12 AM      Failed - HBA1C is between 0 and 7.9 and within 180 days    Hemoglobin A1C  Date Value Ref Range Status  03/21/2016 7.3%  Final   HB A1C (BAYER DCA - WAIVED)  Date Value Ref Range Status  08/22/2018 6.7 <7.0 % Final    Comment:                                          Diabetic Adult            <7.0                                       Healthy Adult        4.3 - 5.7                                                           (DCCT/NGSP) American Diabetes Association's Summary of Glycemic Recommendations for Adults with Diabetes: Hemoglobin A1c <7.0%. More stringent glycemic goals (A1c <6.0%) may further reduce complications at the cost of increased risk of hypoglycemia.          Failed - Valid encounter within last 6 months    Recent Outpatient Visits          9 months ago Migraine without aura and without status migrainosus, not intractable   [facility] Merrie Roof [place], [place]   10 months ago Type 2 diabetes mellitus without complication, without long-term current use of insulin [facility])   [facility] [place], [place], [place]   11 months ago Migraine without aura and without status migrainosus, not intractable   [facility] [place], [place]   1 year ago Lower urinary tract symptoms (LUTS)   [facility] Merrie Roof [place], [place]   1 year  ago Controlled type 2 diabetes mellitus without complication, without long-term current use of insulin ([place])   [facility] Kathrine Haddock, NP

## 2019-06-26 ENCOUNTER — Other Ambulatory Visit: Payer: Self-pay | Admitting: Physician Assistant

## 2019-07-01 ENCOUNTER — Other Ambulatory Visit: Payer: Self-pay

## 2019-07-01 ENCOUNTER — Ambulatory Visit (INDEPENDENT_AMBULATORY_CARE_PROVIDER_SITE_OTHER): Payer: Self-pay | Admitting: Family Medicine

## 2019-07-01 VITALS — BP 114/79 | HR 108 | Temp 98.3°F | Ht 60.25 in | Wt 144.0 lb

## 2019-07-01 DIAGNOSIS — R112 Nausea with vomiting, unspecified: Secondary | ICD-10-CM

## 2019-07-01 DIAGNOSIS — D508 Other iron deficiency anemias: Secondary | ICD-10-CM

## 2019-07-01 DIAGNOSIS — I10 Essential (primary) hypertension: Secondary | ICD-10-CM

## 2019-07-01 DIAGNOSIS — F329 Major depressive disorder, single episode, unspecified: Secondary | ICD-10-CM

## 2019-07-01 DIAGNOSIS — R413 Other amnesia: Secondary | ICD-10-CM

## 2019-07-01 DIAGNOSIS — I5032 Chronic diastolic (congestive) heart failure: Secondary | ICD-10-CM

## 2019-07-01 DIAGNOSIS — D509 Iron deficiency anemia, unspecified: Secondary | ICD-10-CM

## 2019-07-01 DIAGNOSIS — E1165 Type 2 diabetes mellitus with hyperglycemia: Secondary | ICD-10-CM

## 2019-07-01 MED ORDER — PROMETHAZINE HCL 25 MG PO TABS: 25.0000 mg | ORAL_TABLET | Freq: Three times a day (TID) | ORAL | 0 refills | Status: DC | PRN

## 2019-07-01 NOTE — Assessment & Plan Note (Signed)
Recheck CBC today based on poor PO intake and weakness, has a hx of requiring IV iron infusions in the past and feels similar to those times

## 2019-07-01 NOTE — Assessment & Plan Note (Signed)
>>  ASSESSMENT AND PLAN FOR DIABETES MELLITUS TYPE 2 IN NONOBESE (HCC) WRITTEN ON 07/01/2019  1:06 PM BY LANE, RACHEL ELIZABETH, PA-C  Sporadically taking both medications, and having some hypoglycemic episodes given poor PO intake. For now, will hold glipizide and decrease metformin to 500 mg BID until PO back to baseline. Recheck A1C today, continue close monitoring and advancing diet as tolerated. Hoping phenergan will help with this

## 2019-07-01 NOTE — Assessment & Plan Note (Signed)
Sporadically taking both medications, and having some hypoglycemic episodes given poor PO intake. For now, will hold glipizide and decrease metformin to 500 mg BID until PO back to baseline. Recheck A1C today, continue close monitoring and advancing diet as tolerated. Hoping phenergan will help with this

## 2019-07-01 NOTE — Patient Instructions (Signed)
Hold glipizide while not eating much, and cut back to 500 mg metformin twice daily. Once your diet returns more to normal, you can resume previous regimen

## 2019-07-01 NOTE — Assessment & Plan Note (Signed)
Followed by Psychiatry, anxiousness exacerbated currently by health conditions. Continue per their recommendations

## 2019-07-01 NOTE — Assessment & Plan Note (Signed)
Euvolemic today, cautiously continue lasix pending repeat labs.
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
## 2019-07-01 NOTE — Assessment & Plan Note (Signed)
Will cautiously proceed with lasix, pending repeat labs. Euvolemic today but concern for hypotensive episodes given her poor PO intake currently. Monitor home readings carefully

## 2019-07-02 LAB — COMPREHENSIVE METABOLIC PANEL WITH GFR
ALT: 18 [IU]/L (ref 0–32)
AST: 24 [IU]/L (ref 0–40)
Albumin/Globulin Ratio: 1.3 (ref 1.2–2.2)
Albumin: 3.2 g/dL — ABNORMAL LOW (ref 3.8–4.8)
Alkaline Phosphatase: 168 [IU]/L — ABNORMAL HIGH (ref 39–117)
BUN/Creatinine Ratio: 11 — ABNORMAL LOW (ref 12–28)
BUN: 9 mg/dL (ref 8–27)
Bilirubin Total: <0.2 mg/dL (ref 0.0–1.2)
CO2: 23 mmol/L (ref 20–29)
Calcium: 8.6 mg/dL — ABNORMAL LOW (ref 8.7–10.3)
Chloride: 103 mmol/L (ref 96–106)
Creatinine, Ser: 0.8 mg/dL (ref 0.57–1.00)
GFR calc Af Amer: 91 mL/min/{1.73_m2}
GFR calc non Af Amer: 79 mL/min/{1.73_m2}
Globulin, Total: 2.5 g/dL (ref 1.5–4.5)
Glucose: 244 mg/dL — ABNORMAL HIGH (ref 65–99)
Potassium: 4.8 mmol/L (ref 3.5–5.2)
Sodium: 138 mmol/L (ref 134–144)
Total Protein: 5.7 g/dL — ABNORMAL LOW (ref 6.0–8.5)

## 2019-07-02 LAB — CBC WITH DIFFERENTIAL/PLATELET
Basophils Absolute: 0 10*3/uL (ref 0.0–0.2)
Basos: 0 %
EOS (ABSOLUTE): 0.1 10*3/uL (ref 0.0–0.4)
Eos: 1 %
Hematocrit: 35.1 % (ref 34.0–46.6)
Hemoglobin: 11 g/dL — ABNORMAL LOW (ref 11.1–15.9)
Immature Grans (Abs): 0 10*3/uL (ref 0.0–0.1)
Immature Granulocytes: 1 %
Lymphocytes Absolute: 1.2 10*3/uL (ref 0.7–3.1)
Lymphs: 25 %
MCH: 27.2 pg (ref 26.6–33.0)
MCHC: 31.3 g/dL — ABNORMAL LOW (ref 31.5–35.7)
MCV: 87 fL (ref 79–97)
Monocytes Absolute: 0.5 10*3/uL (ref 0.1–0.9)
Monocytes: 10 %
Neutrophils Absolute: 3 10*3/uL (ref 1.4–7.0)
Neutrophils: 63 %
Platelets: 488 10*3/uL — ABNORMAL HIGH (ref 150–450)
RBC: 4.04 x10E6/uL (ref 3.77–5.28)
RDW: 14.9 % (ref 11.7–15.4)
WBC: 4.7 10*3/uL (ref 3.4–10.8)

## 2019-07-02 LAB — HEMOGLOBIN A1C
Est. average glucose Bld gHb Est-mCnc: 137 mg/dL
Hgb A1c MFr Bld: 6.4 % — ABNORMAL HIGH (ref 4.8–5.6)

## 2019-07-03 ENCOUNTER — Ambulatory Visit: Payer: Self-pay

## 2019-07-20 ENCOUNTER — Other Ambulatory Visit: Payer: Self-pay | Admitting: Family Medicine

## 2019-07-20 NOTE — Telephone Encounter (Signed)
Requested medication (s) are due for refill today: yes  Requested medication (s) are on the active medication list:yes  Last refill: 06/23/2019  Future visit scheduled: no  Notes to clinic:  patient had appointment on 07/01/2019. Review for refill and Qty   Requested Prescriptions  Pending Prescriptions Disp Refills   glipiZIDE (GLUCOTROL) 5 MG tablet [Pharmacy Med Name: glipiZIDE 5 MG Oral Tablet] 14 tablet 0    Sig: TAKE 1 TABLET BY MOUTH TWICE DAILY WITH MEALS . APPOINTMENT REQUIRED FOR FUTURE REFILLS      Endocrinology:  Diabetes - Sulfonylureas Passed - 07/20/2019  8:19 AM      Passed - HBA1C is between 0 and 7.9 and within 180 days    Hemoglobin A1C  Date Value Ref Range Status  03/21/2016 7.3%  Final   HB A1C (BAYER DCA - WAIVED)  Date Value Ref Range Status  08/22/2018 6.7 <7.0 % Final    Comment:                                          Diabetic Adult            <7.0                                       Healthy Adult        4.3 - 5.7                                                           (DCCT/NGSP) American Diabetes Association's Summary of Glycemic Recommendations for Adults with Diabetes: Hemoglobin A1c <7.0%. More stringent glycemic goals (A1c <6.0%) may further reduce complications at the cost of increased risk of hypoglycemia.    Hgb A1c MFr Bld  Date Value Ref Range Status  07/01/2019 6.4 (H) 4.8 - 5.6 % Final    Comment:             Prediabetes: 5.7 - 6.4          Diabetes: >6.4          Glycemic control for adults with diabetes: <7.0           Passed - Valid encounter within last 6 months    Recent Outpatient Visits           2 weeks ago Hypertension, unspecified type   [facility], [place], [place]   10 months ago Migraine without aura and without status migrainosus, not intractable   [facility], [place], [place]   11 months ago Type 2 diabetes mellitus without complication, without  long-term current use of insulin [facility])   [facility], [place], [place]   1 year ago Migraine without aura and without status migrainosus, not intractable   [place], [name], [place]   1 year ago Lower urinary tract symptoms (LUTS)   [place], [place], [place]

## 2019-07-25 ENCOUNTER — Other Ambulatory Visit: Payer: Self-pay | Admitting: Physician Assistant

## 2019-07-27 ENCOUNTER — Other Ambulatory Visit: Payer: Self-pay | Admitting: Internal Medicine

## 2019-07-27 NOTE — Telephone Encounter (Signed)
Sent message for front office to set up apt, last visit May. Due back 6 months

## 2019-07-28 ENCOUNTER — Telehealth: Payer: Self-pay

## 2019-07-28 NOTE — Telephone Encounter (Signed)
Patient notified of recent lab results. Patient complains of bilateral leg swelling where they are becoming tight, she has been using support stockings, she would like to know what to do about this.   Copied from [employer] [phone]. Topic: General - Other >> Jul 28, 2019  4:11 PM Wynetta Emery, [place] C wrote: Reason for CRM: pt called in for her lab results.   Please assist

## 2019-07-29 ENCOUNTER — Telehealth: Payer: Self-pay | Admitting: Internal Medicine

## 2019-07-29 MED ORDER — FUROSEMIDE 20 MG PO TABS: 20.0000 mg | ORAL_TABLET | Freq: Every day | ORAL | 11 refills | Status: DC

## 2019-07-29 NOTE — Telephone Encounter (Signed)
Call to patient to discuss LE swelling. She has been using compression and trying to keep legs elevated with some improvement. She denies SOB or weight gain.   Pt ran out of lasix and is going to pick up Rx today. Pt is scheduled for appt 1/11. She will continue to monitor swelling and weights. Encouraged pt to call if SOB occurs or if she has any new or worsening sx.

## 2019-07-29 NOTE — Telephone Encounter (Signed)
Pt c/o swelling: STAT is pt has developed SOB within 24 hours  1) How much weight have you gained and in what time span? no  2) If swelling, where is the swelling located? Legs (including feet/ankles)  3) Are you currently taking a fluid pill? Pt ran out. Requested a refill earlier in the week  4) Are you currently SOB? no  5) Do you have a log of your daily weights (if so, list)? No. Pt does not have a scale. Pt went to different doctors appts this month (~2 weeks apart). Weighed 140 at 1 appt and 145 at the next.    6) Have you gained 3 pounds in a day or 5 pounds in a week? no  7) Have you traveled recently? no  Pt wears support socks during the day, as she is on her feet for work. She notices that when she gets home the swelling (fluid) backs up and her knees are swollen to twice their normal size. She reports a 10 lb weight loss but her legs are still swollen. Pt has diabetes and her a1c was 6 at last test  Patient just got a refill of her lasix, but she has not started taking the new rx yet. Pt also has an appt scheduled to see Dr. Saunders Revel 08-10-19 at 1:40 pm

## 2019-07-30 NOTE — Telephone Encounter (Signed)
Called pt to schedule appt, no answer, left vm

## 2019-07-30 NOTE — Telephone Encounter (Signed)
Needs appt

## 2019-08-06 NOTE — Telephone Encounter (Signed)
Called patient, no answer, left a message asking patient to return my call to schedule an appt if she is still having the swelling in her legs.

## 2019-08-10 ENCOUNTER — Ambulatory Visit (INDEPENDENT_AMBULATORY_CARE_PROVIDER_SITE_OTHER): Payer: Self-pay | Admitting: Internal Medicine

## 2019-08-10 ENCOUNTER — Encounter: Payer: Self-pay | Admitting: Internal Medicine

## 2019-08-10 ENCOUNTER — Other Ambulatory Visit: Payer: Self-pay

## 2019-08-10 ENCOUNTER — Ambulatory Visit (INDEPENDENT_AMBULATORY_CARE_PROVIDER_SITE_OTHER): Payer: Self-pay

## 2019-08-10 VITALS — BP 120/62 | HR 80 | Ht 61.0 in | Wt 141.5 lb

## 2019-08-10 DIAGNOSIS — R42 Dizziness and giddiness: Secondary | ICD-10-CM

## 2019-08-10 DIAGNOSIS — R002 Palpitations: Secondary | ICD-10-CM

## 2019-08-10 DIAGNOSIS — M7989 Other specified soft tissue disorders: Secondary | ICD-10-CM

## 2019-08-10 DIAGNOSIS — I5032 Chronic diastolic (congestive) heart failure: Secondary | ICD-10-CM

## 2019-08-10 DIAGNOSIS — M79604 Pain in right leg: Secondary | ICD-10-CM

## 2019-08-10 DIAGNOSIS — M79605 Pain in left leg: Secondary | ICD-10-CM

## 2019-08-10 NOTE — Patient Instructions (Signed)
Medication Instructions:  Your physician recommends that you continue on your current medications as directed. Please refer to the Current Medication list given to you today.  *If you need a refill on your cardiac medications before your next appointment, please call your pharmacy*  Lab Work: none If you have labs (blood work) drawn today and your tests are completely normal, you will receive your results only by: [name] MyChart Message (if you have MyChart) OR . A paper copy in the mail If you have any lab test that is abnormal or we need to change your treatment, we will call you to review the results.  Testing/Procedures: Your physician has requested that you have a lower extremity venous duplex. This test is an ultrasound of the veins in the legs or arms. It looks at venous blood flow that carries blood from the heart to the legs. Allow one hour for a Lower Venous exam. There are no restrictions or special instructions.   Your physician has recommended that you wear an 14 day ZIO event monitor. Event monitors are medical devices that record the heart's electrical activity. Doctors most often [name] these monitors to diagnose arrhythmias. Arrhythmias are problems with the speed or rhythm of the heartbeat. The monitor is a small, portable device. You can wear one while you do your normal daily activities. This is usually used to diagnose what is causing palpitations/syncope (passing out). A Zio Patch Event Heart monitor will be applied to your chest today.  You will wear the patch for 14 days. After 24 hours, you may shower with the heart monitor on. If you feel any SYMPTOMS, you may press and release the button in the middle of the monitor.  Follow-Up: At [facility], you and your health needs are our priority.  As part of our continuing mission to provide you with exceptional heart care, we have created designated Provider Care Teams.  These Care Teams include your primary Cardiologist  (physician) and Advanced Practice Providers (APPs -  Physician Assistants and Nurse Practitioners) who all work together to provide you with the care you need, when you need it.  Your next appointment:   6 week(s)  The format for your next appointment:   In Person  Provider:    You may see DR Harrell Gave END or one of the following Advanced Practice Providers on your designated Care Team:    Murray Hodgkins, NP  Christell Faith, PA-C  Marrianne Mood, PA-C

## 2019-08-10 NOTE — Progress Notes (Signed)
Follow-up Outpatient Visit Date: 08/10/2019  Primary Care Provider: Volney American, PA-C [place] [id number]  Chief Complaint: Swelling and lightheadedness  HPI:  Ms. Balash is a 64 y.o. female with history of chronic HFpEF, diabetes mellitus, anemia, anxiety, depression, and restless leg syndrome, who presents for follow-up of HFpEF and atypical chest pain.  I last saw her in 04/2018, at which time Ms. Fitzsimons reported feeling well without further chest pain after the addition of omeprazole for treatment of possible GERD in the setting of hiatal hernia.  Today, Ms. Linebaugh reports doing relatively well.  Beginning in November, she experienced worsening leg swelling that she now attributes to increased sodium intake.  Since cutting back on her salt consumption, the edema has resolved.  She had recently put on 4 pounds, though that she has lost this weight after taking an extra dose of furosemide last week.  She denies chest pain, shortness of breath, and orthopnea.  Ms. Righter is also concerned about intermittent palpitations that happen randomly and are associated with lightheadedness.  These also began in November around the time that she was diagnosed with diverticulitis.  Sometimes, the palpitations will last up to 30 minutes at a time.  She tried cutting back on her caffeine consumption in November, though it is unclear if this affected her palpitations.  Ms. Hogsett also notes intermittent leg pain, including cramping in her her calves yesterday.  --------------------------------------------------------------------------------------------------  Cardiovascular History & Procedures: Cardiovascular Problems:  Chronic diastolic heart failure  Atypical chest pain  Risk Factors:  Diabetes mellitus  Cath/PCI:  None  CV Surgery:  None  EP Procedures and Devices:  None  Non-Invasive Evaluation(s):  Exercise MPI (08/30/16): Low risk study without  significant ischemia or scar. LVEF 50%.  TTE (08/23/16): Normal LV size and wall thickness. LVEF 55-60% with normal wall motion. Grade 1 diastolic dysfunction. Mild MR. Mild left atrial enlargement. Normal RV size and function.  Recent CV Pertinent Labs: Lab Results  Component Value Date   CHOL 139 07/17/2016   CHOL 148 01/10/2012   HDL 47 01/10/2012   LDLCALC 79 01/10/2012   TRIG 99 07/17/2016   TRIG 110 01/10/2012   K 4.8 07/01/2019   K 4.1 01/10/2012   BUN 9 07/01/2019   BUN 4 (L) 01/10/2012   CREATININE 0.80 07/01/2019   CREATININE 0.58 (L) 01/10/2012    Past medical and surgical history were reviewed and updated in EPIC.  Current Meds  Medication Sig  . ALPRAZolam (XANAX) 0.5 MG tablet TAKE 1 TABLET BY MOUTH EVERY 6 HOURS AS NEEDED FOR ANXIETY NEED  TO  SCHEDULE  APPOINTMENT  . buPROPion (WELLBUTRIN XL) 150 MG 24 hr tablet Take 3 tablets by mouth once daily  . estradiol (ESTRACE VAGINAL) 0.1 MG/GM vaginal cream Apply 0.104m (pea-sized amount)  just inside the vaginal introitus with a finger-tip on Monday, Wednesday and Friday nights.  . furosemide (LASIX) 20 MG tablet Take 1 tablet (20 mg total) by mouth daily.  .Marland KitchenglipiZIDE (GLUCOTROL) 5 MG tablet TAKE 1 TABLET BY MOUTH TWICE DAILY WITH MEALS . APPOINTMENT REQUIRED FOR FUTURE REFILLS  . lamoTRIgine (LAMICTAL) 200 MG tablet TAKE 2 TABLETS BY MOUTH AT BEDTIME  . metFORMIN (GLUCOPHAGE) 1000 MG tablet TAKE 1 TABLET BY MOUTH TWICE DAILY WITH MEALS  . nitrofurantoin (MACRODANTIN) 100 MG capsule Take 1 capsule (100 mg total) by mouth daily.  . ONE TOUCH ULTRA TEST test strip daily.  . pramipexole (MIRAPEX) 0.5 MG tablet Take  1 tablet (0.5 mg total) by mouth at bedtime.  . promethazine (PHENERGAN) 25 MG tablet Take 1 tablet (25 mg total) by mouth every 8 (eight) hours as needed for nausea or vomiting.  . risperiDONE (RISPERDAL) 3 MG tablet TAKE 1 TABLET BY MOUTH AT BEDTIME  . sertraline (ZOLOFT) 100 MG tablet TAKE 1 TABLET BY MOUTH IN  THE MORNING  . zolpidem (AMBIEN CR) 12.5 MG CR tablet TAKE 1 TABLET BY MOUTH AT BEDTIME AS NEEDED FOR SLEEP    Allergies: Avelox [moxifloxacin hcl in nacl], Bactrim [sulfamethoxazole-trimethoprim], Ciprofloxacin, Depakote [divalproex sodium], Imitrex [sumatriptan], and Stadol [butorphanol]  Social History   Tobacco Use  . Smoking status: Former Smoker    Packs/day: 0.50    Types: Cigarettes    Quit date: 04/25/1975    Years since quitting: 44.3  . Smokeless tobacco: Never Used  . Tobacco comment: quit 40 years  Substance Use Topics  . Alcohol use: No    Alcohol/week: 0.0 standard drinks  . Drug use: No    Family History  Problem Relation Age of Onset  . Stroke Father   . Heart failure Sister   . Bladder Cancer Neg Hx   . Kidney disease Neg Hx   . Prostate cancer Neg Hx   . Kidney cancer Neg Hx     Review of Systems: A 12-system review of systems was performed and was negative except as noted in the HPI.  --------------------------------------------------------------------------------------------------  Physical Exam: BP 120/62 (BP Location: Left Arm, Patient Position: Sitting, Cuff Size: Normal)   Pulse 80   Ht 5' 1"  (1.549 m)   Wt 141 lb 8 oz (64.2 kg)   LMP  (LMP Unknown)   SpO2 97%   BMI 26.74 kg/m   General: NAD. HEENT: No conjunctival pallor or scleral icterus. Facemask in place. Neck: Supple without lymphadenopathy, thyromegaly, JVD, or HJR. Lungs: Normal work of breathing. Clear to auscultation bilaterally without wheezes or crackles. Heart: Regular rate and rhythm without murmurs, rubs, or gallops. Non-displaced PMI. Abd: Bowel sounds present. Soft, NT/ND without hepatosplenomegaly Ext: Trace pretibial edema bilaterally.  Tenderness noted along the posterior aspect of the left calf.  Radial and pedal pulses are 1-2+. Skin: Warm and dry without rash.  EKG: Normal sinus rhythm with borderline LVH.  Lab Results  Component Value Date   WBC 4.7 07/01/2019    HGB 11.0 (L) 07/01/2019   HCT 35.1 07/01/2019   MCV 87 07/01/2019   PLT 488 (H) 07/01/2019    Lab Results  Component Value Date   NA 138 07/01/2019   K 4.8 07/01/2019   CL 103 07/01/2019   CO2 23 07/01/2019   BUN 9 07/01/2019   CREATININE 0.80 07/01/2019   GLUCOSE 244 (H) 07/01/2019   ALT 18 07/01/2019    Lab Results  Component Value Date   CHOL 139 07/17/2016   HDL 47 01/10/2012   LDLCALC 79 01/10/2012   TRIG 99 07/17/2016    --------------------------------------------------------------------------------------------------  ASSESSMENT AND PLAN: Chronic HFpEF: I suspect recent weight gain and edema was related to increased sodium intake.  Since cutting down on her salt and taking an extra dose of furosemide last week, Ms. Heinke reports that her edema is essentially gone and she feels close to her baseline.  We discussed the importance of sodium restriction.  Continue current dose of furosemide.  Palpitations: Present since November with associated lightheadedness.  EKG today is normal.  We will obtain a 14-day event monitor for further assessment.  Leg pain:  Most likely musculoskeletal, though in the setting of recent edema, we have agreed to obtain a lower extremity venous duplex to exclude DVT.  Pedal pulses are reasonable on exam today.  If symptoms persist, we will need to consider ABIs.  Follow-up: Return to clinic in 6 weeks.  Nelva Bush, MD 08/11/2019 8:35 PM

## 2019-08-12 ENCOUNTER — Ambulatory Visit (INDEPENDENT_AMBULATORY_CARE_PROVIDER_SITE_OTHER): Payer: Self-pay

## 2019-08-12 ENCOUNTER — Other Ambulatory Visit: Payer: Self-pay

## 2019-08-12 DIAGNOSIS — M7989 Other specified soft tissue disorders: Secondary | ICD-10-CM

## 2019-08-12 DIAGNOSIS — M79605 Pain in left leg: Secondary | ICD-10-CM

## 2019-08-12 DIAGNOSIS — M79604 Pain in right leg: Secondary | ICD-10-CM

## 2019-08-13 ENCOUNTER — Encounter: Payer: Self-pay | Admitting: Physician Assistant

## 2019-08-13 ENCOUNTER — Ambulatory Visit (INDEPENDENT_AMBULATORY_CARE_PROVIDER_SITE_OTHER): Payer: Self-pay | Admitting: Physician Assistant

## 2019-08-13 VITALS — BP 100/64 | HR 93 | Ht 61.0 in | Wt 139.0 lb

## 2019-08-13 DIAGNOSIS — R300 Dysuria: Secondary | ICD-10-CM

## 2019-08-13 DIAGNOSIS — Z87440 Personal history of urinary (tract) infections: Secondary | ICD-10-CM

## 2019-08-13 LAB — URINALYSIS, COMPLETE
Bilirubin, UA: NEGATIVE
Glucose, UA: NEGATIVE
Nitrite, UA: POSITIVE — AB
Protein,UA: NEGATIVE
RBC, UA: NEGATIVE
Specific Gravity, UA: 1.02 (ref 1.005–1.030)
Urobilinogen, Ur: 0.2 mg/dL (ref 0.2–1.0)
pH, UA: 5 (ref 5.0–7.5)

## 2019-08-13 LAB — MICROSCOPIC EXAMINATION
Epithelial Cells (non renal): >10 /hpf — AB (ref 0–10)
RBC, Urine: NONE SEEN /hpf (ref 0–2)

## 2019-08-13 NOTE — Progress Notes (Signed)
08/13/2019 3:44 PM   Amy Hansen 07-Sep-1955 [id number]  CC: Dysuria, urgency, frequency, nocturia  HPI: Amy Hansen is a 64 y.o. female who presents today for evaluation of possible UTI. PMH significant for recurrent UTI, most recently on 04/21/2019 with probable ESBL E. coli and treated with Omnicef twice daily x5 days. She is on Macrobid 100 mg daily for prophylaxis.  Today, she reports a 1 week history of dysuria, urgency, frequency, and nocturia.  She denies gross hematuria, flank pain, fever, and chills.  She has not taken anything at home to treat the symptoms.  She also reports some nausea and dry heaves over the last 3 weeks, however she is unsure if this is associated with her urinary symptoms.  Of note, she did have an episode of diverticulitis in late 2020 associated with nausea and vomiting.  In-office UA today positive for nitrites and trace leukocyte esterase; urine microscopy with >10 epithelial cells and moderate bacteria.   PMH: Past Medical History:  Diagnosis Date  . Anemia   . Anxiety   . Chronic kidney disease    UTI, hematuria in urine  . Depression   . Diabetes ([place])   . Diverticulosis   . Frequent headaches   . Interstitial cystitis   . Recurrent UTI   . Restless leg syndrome   . Urinary frequency     Surgical History: Past Surgical History:  Procedure Laterality Date  . ABDOMINAL HYSTERECTOMY    . bariatric bypass  2012  . CARPAL TUNNEL RELEASE Right 2003  . CARPAL TUNNEL RELEASE Right    2008  . CHOLECYSTECTOMY  1975  . CYSTOSCOPY W/ RETROGRADES Bilateral 06/06/2015   Procedure: CYSTOSCOPY WITH RETROGRADE PYELOGRAM;  Surgeon: Festus Aloe, MD;  Location: [facility] ORS;  Service: Urology;  Laterality: Bilateral;  . FL INJ LEFT KNEE CT ARTHROGRAM ([facility] HX) Left    1995  . GASTRIC BYPASS  2010  . [place]  2013  . KNEE ARTHROSCOPY Left 1996  . TONSILLECTOMY      Home Medications:  Allergies as of 08/13/2019      Reactions   Avelox [moxifloxacin Hcl In Nacl] Anaphylaxis   Bactrim [sulfamethoxazole-trimethoprim] Anaphylaxis   Ciprofloxacin Other (See Comments)   Pt states she was told never to take this as it is in the same family as Avelox.    Depakote [divalproex Sodium]    Imitrex [sumatriptan] Other (See Comments)   Neck and shoulder pain   Stadol [butorphanol] Rash      Medication List       Accurate as of August 13, 2019  3:44 PM. If you have any questions, ask your nurse or doctor.        ALPRAZolam 0.5 MG tablet Commonly known as: XANAX TAKE 1 TABLET BY MOUTH EVERY 6 HOURS AS NEEDED FOR ANXIETY NEED  TO  SCHEDULE  APPOINTMENT   buPROPion 150 MG 24 hr tablet Commonly known as: WELLBUTRIN XL Take 3 tablets by mouth once daily   estradiol 0.1 MG/GM vaginal cream Commonly known as: ESTRACE VAGINAL Apply 0.27m (pea-sized amount)  just inside the vaginal introitus with a finger-tip on Monday, Wednesday and Friday nights.   furosemide 20 MG tablet Commonly known as: LASIX Take 1 tablet (20 mg total) by mouth daily.   glipiZIDE 5 MG tablet Commonly known as: GLUCOTROL TAKE 1 TABLET BY MOUTH TWICE DAILY WITH MEALS . APPOINTMENT REQUIRED FOR FUTURE REFILLS   lamoTRIgine 200 MG tablet Commonly known as: LAMICTAL TAKE 2  TABLETS BY MOUTH AT BEDTIME   metFORMIN 1000 MG tablet Commonly known as: GLUCOPHAGE TAKE 1 TABLET BY MOUTH TWICE DAILY WITH MEALS   nitrofurantoin 100 MG capsule Commonly known as: Macrodantin Take 1 capsule (100 mg total) by mouth daily.   ONE TOUCH ULTRA TEST test strip Generic drug: glucose blood daily.   pramipexole 0.5 MG tablet Commonly known as: MIRAPEX Take 1 tablet (0.5 mg total) by mouth at bedtime.   promethazine 25 MG tablet Commonly known as: PHENERGAN Take 1 tablet (25 mg total) by mouth every 8 (eight) hours as needed for nausea or vomiting.   risperiDONE 3 MG tablet Commonly known as: RISPERDAL TAKE 1 TABLET BY MOUTH AT BEDTIME   sertraline 100  MG tablet Commonly known as: ZOLOFT TAKE 1 TABLET BY MOUTH IN THE MORNING   zolpidem 12.5 MG CR tablet Commonly known as: AMBIEN CR TAKE 1 TABLET BY MOUTH AT BEDTIME AS NEEDED FOR SLEEP       Allergies:  Allergies  Allergen Reactions  . Avelox [Moxifloxacin Hcl In Nacl] Anaphylaxis  . Bactrim [Sulfamethoxazole-Trimethoprim] Anaphylaxis  . Ciprofloxacin Other (See Comments)    Pt states she was told never to take this as it is in the same family as Avelox.   . Depakote [Divalproex Sodium]   . Imitrex [Sumatriptan] Other (See Comments)    Neck and shoulder pain  . Stadol [Butorphanol] Rash    Family History: Family History  Problem Relation Age of Onset  . Stroke Father   . Heart failure Sister   . Bladder Cancer Neg Hx   . Kidney disease Neg Hx   . Prostate cancer Neg Hx   . Kidney cancer Neg Hx     Social History:   reports that she quit smoking about 44 years ago. Her smoking use included cigarettes. She smoked 0.50 packs per day. She has never used smokeless tobacco. She reports that she does not drink alcohol or use drugs.  ROS: UROLOGY Frequent Urination?: Yes Hard to postpone urination?: Yes Burning/pain with urination?: Yes Get up at night to urinate?: Yes Leakage of urine?: Yes Urine stream starts and stops?: No Trouble starting stream?: No Do you have to strain to urinate?: No Blood in urine?: No Urinary tract infection?: Yes Sexually transmitted disease?: No Injury to kidneys or bladder?: No Painful intercourse?: No Weak stream?: No Currently pregnant?: No Vaginal bleeding?: No Last menstrual period?: n  Gastrointestinal Nausea?: Yes Vomiting?: No Indigestion/heartburn?: No Diarrhea?: No Constipation?: No  Constitutional Fever: No Night sweats?: No Weight loss?: No Fatigue?: No  Skin Skin rash/lesions?: No Itching?: No  Eyes Blurred vision?: No Double vision?: No  Ears/Nose/Throat Sore throat?: No Sinus problems?:  No  Hematologic/Lymphatic Swollen glands?: No Easy bruising?: Yes  Cardiovascular Leg swelling?: Yes Chest pain?: No  Respiratory Cough?: No Shortness of breath?: No  Endocrine Excessive thirst?: No  Musculoskeletal Back pain?: No Joint pain?: No  Neurological Headaches?: Yes Dizziness?: No  Psychologic Depression?: No Anxiety?: No  Physical Exam: BP 100/64   Pulse 93   Ht 5' 1"  (1.549 m)   Wt 139 lb (63 kg)   LMP  (LMP Unknown)   BMI 26.26 kg/m   Constitutional:  Alert and oriented, no acute distress, nontoxic appearing HEENT: [employer], AT Cardiovascular: No clubbing, cyanosis, or edema Respiratory: Normal respiratory effort, no increased work of breathing GU: No CVA tenderness Skin: No rashes, bruises or suspicious lesions Neurologic: Grossly intact, no focal deficits, moving all 4 extremities Psychiatric: Normal mood  and affect  Laboratory Data: Results for orders placed or performed in visit on 08/13/19  Microscopic Examination   URINE  Result Value Ref Range   WBC, UA 0-5 0 - 5 /hpf   RBC None seen 0 - 2 /hpf   Epithelial Cells (non renal) >10 (A) 0 - 10 /hpf   Bacteria, UA Moderate (A) None seen/Few  Urinalysis, Complete  Result Value Ref Range   Specific Gravity, UA 1.020 1.005 - 1.030   pH, UA 5.0 5.0 - 7.5   Color, UA Yellow Yellow   Appearance Ur Cloudy (A) Clear   Leukocytes,UA Trace (A) Negative   Protein,UA Negative Negative/Trace   Glucose, UA Negative Negative   Ketones, UA Trace (A) Negative   RBC, UA Negative Negative   Bilirubin, UA Negative Negative   Urobilinogen, Ur 0.2 0.2 - 1.0 mg/dL   Nitrite, UA Positive (A) Negative   Microscopic Examination See below:    Assessment & Plan:   64 year old female with a history of recurrent UTI here with a 1 week history of dysuria, urgency, frequency, and nocturia. 1. Dysuria Patient provided a contaminated sample today, however it was nitrite positive.  Will send for culture and treat based  on results. - Urinalysis, Complete - CULTURE, URINE COMPREHENSIVE  2. History of recurrent UTI (urinary tract infection) On Macrobid daily prophylaxis, due for routine follow-up with Dr. Matilde Sprang.  We will schedule this today; anticipate that we will have today's urine culture results in advance of that appointment.  If her current infection is resistant to Macrobid, may consider transitioning her to new prophylactic antibiotic or stopping them altogether.  Return in about 4 weeks (around 09/10/2019) for rUTI follow-up with Dr. Matilde Sprang.  Debroah Loop, PA-C  [facility] [address] [place], [place] [address] [phone]

## 2019-08-16 LAB — CULTURE, URINE COMPREHENSIVE

## 2019-08-17 ENCOUNTER — Telehealth: Payer: Self-pay | Admitting: Physician Assistant

## 2019-08-17 MED ORDER — CEFDINIR 300 MG PO CAPS: 300.0000 mg | ORAL_CAPSULE | Freq: Two times a day (BID) | ORAL | 0 refills | Status: AC

## 2019-08-17 NOTE — Telephone Encounter (Signed)
Called pt and informed her of her prescription waiting for pickup. Advised pt to stop the Macrobid and  Start the [place]. Pt verbalized understanding.

## 2019-08-17 NOTE — Telephone Encounter (Signed)
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  Omnicef 300 mg twice daily x5 days sent to the [facility] in [place].  Please contact patient and instruct her to stop her suppressive Macrobid and start this antibiotic ASAP.    As this is her third Macrobid resistant infection since August, I do not think she needs to restart Macrobid when she completes [place].  She can discuss further with Dr. Matilde Sprang at her follow-up appointment with him later this month.

## 2019-08-19 NOTE — Telephone Encounter (Signed)
No contact from patient after multiple attempts. Will close this encounter.

## 2019-08-21 ENCOUNTER — Telehealth: Payer: Self-pay | Admitting: Family Medicine

## 2019-08-21 MED ORDER — PRAMIPEXOLE DIHYDROCHLORIDE 0.5 MG PO TABS: 0.5000 mg | ORAL_TABLET | Freq: Every day | ORAL | 0 refills | Status: DC

## 2019-08-21 NOTE — Telephone Encounter (Signed)
Will send in short supply, needs to be seen in the next week or two about it. Can see anyone

## 2019-08-21 NOTE — Telephone Encounter (Signed)
Pt called and stated that she would like medications called Pramipexole dihydrochloride pt states that when she is trying to sleep her legs will not stop moving. Pt states that she can not come see Apolonio Schne[address] until next Wednesday but it looks like she will be out of the office. Pt would like to know if this can be called in because the old RX she has is out of date. Please advise    [place], [place] Phone:  [phone]  Fax:  [phone]

## 2019-08-21 NOTE — Telephone Encounter (Signed)
Routing to provider to advise.  

## 2019-08-22 ENCOUNTER — Other Ambulatory Visit: Payer: Self-pay | Admitting: Physician Assistant

## 2019-08-23 NOTE — Telephone Encounter (Signed)
Has apt 01/27

## 2019-08-24 ENCOUNTER — Encounter: Payer: Self-pay | Admitting: Urology

## 2019-08-24 ENCOUNTER — Ambulatory Visit (INDEPENDENT_AMBULATORY_CARE_PROVIDER_SITE_OTHER): Payer: Self-pay | Admitting: Urology

## 2019-08-24 ENCOUNTER — Other Ambulatory Visit: Payer: Self-pay

## 2019-08-24 VITALS — BP 118/79 | HR 109 | Ht 61.0 in | Wt 140.0 lb

## 2019-08-24 DIAGNOSIS — R300 Dysuria: Secondary | ICD-10-CM

## 2019-08-24 DIAGNOSIS — N3020 Other chronic cystitis without hematuria: Secondary | ICD-10-CM

## 2019-08-24 LAB — URINALYSIS, COMPLETE
Bilirubin, UA: NEGATIVE
Glucose, UA: NEGATIVE
Ketones, UA: NEGATIVE
Leukocytes,UA: NEGATIVE
Nitrite, UA: NEGATIVE
Protein,UA: NEGATIVE
RBC, UA: NEGATIVE
Specific Gravity, UA: 1.015 (ref 1.005–1.030)
Urobilinogen, Ur: 0.2 mg/dL (ref 0.2–1.0)
pH, UA: 5.5 (ref 5.0–7.5)

## 2019-08-24 LAB — MICROSCOPIC EXAMINATION
Bacteria, UA: NONE SEEN
RBC, Urine: NONE SEEN /hpf (ref 0–2)

## 2019-08-24 MED ORDER — CEPHALEXIN 250 MG PO CAPS: 250.0000 mg | ORAL_CAPSULE | Freq: Two times a day (BID) | ORAL | 11 refills | Status: DC

## 2019-08-24 NOTE — Addendum Note (Signed)
Addended by: Verlene Mayer A on: 08/24/2019 09:02 AM   Modules accepted: Orders

## 2019-08-24 NOTE — Progress Notes (Signed)
08/24/2019 8:52 AM   Amy Hansen [birth year] [id number]  Referring provider: Volney American, PA-C [address] [place],  [place] [address]  Chief Complaint  Patient presents with  . Follow-up    HPI: Amy Hansen:Amy Hansen a57 y.o.femaleCaucasian with a history of hematuria, IC and history of rUTI's who returns today for a f/u for the evaluation and management of rUTIs and hematuria.When seen in February by nurse practitioner she had cystitis symptoms. It appears in 2016 she was evaluated with retrogrades and cystoscopy and a nonenhanced CT scan that was negative. She was told to continue vaginal estrogen cream in the urine culture was positive on that same day of the visit. It appears she has had 3+ urine cultures in the last 2 months  Today Patient describes back-to-back infections over the last 5 months. When she is not infected she voids 3 times a night and every 2 hours during the day and stays continent. She has had a previous sling. .   Start patient on Macrodantin and reevaluate in 7 weeks.She has significant allergies to a number of antibiotics. I did not yet perform cystoscopy but she has had a previous sling  Today When I saw the patient in March the urine culture is positive.  She has had 2+ culture since.  Frequency stable  Family history a little bit difficult but she seems to have been doing very well in the last 8 or 10 weeks on daily Macrodantin.  In the last 3 days her urine is a bit foul-smelling with intermittent burning and we sent the urine for culture.  She said she is been doing well on it.  She never had the ultrasound.    Reviewed chart Patient has breakthrough infections on daily Macrobid.  Just finished antibiotic.  She is not certain she still infected.  Frequency stable.  Nocturia stable  I will send urine for culture.  Because of allergies I cannot use trimethoprim.  I will switch her to daily Keflex and see her in 3  months.  Urinalysis reviewed.  Culture sent.  Reviewed   PMH: Past Medical History:  Diagnosis Date  . Anemia   . Anxiety   . Chronic kidney disease    UTI, hematuria in urine  . Depression   . Diabetes ([place])   . Diverticulosis   . Frequent headaches   . Interstitial cystitis   . Recurrent UTI   . Restless leg syndrome   . Urinary frequency     Surgical History: Past Surgical History:  Procedure Laterality Date  . ABDOMINAL HYSTERECTOMY    . bariatric bypass  2012  . CARPAL TUNNEL RELEASE Right 2003  . CARPAL TUNNEL RELEASE Right    2008  . CHOLECYSTECTOMY  1975  . CYSTOSCOPY W/ RETROGRADES Bilateral 06/06/2015   Procedure: CYSTOSCOPY WITH RETROGRADE PYELOGRAM;  Surgeon: Festus Aloe, MD;  Location: [facility] ORS;  Service: Urology;  Laterality: Bilateral;  . FL INJ LEFT KNEE CT ARTHROGRAM ([facility] HX) Left    1995  . GASTRIC BYPASS  2010  . [place]  2013  . KNEE ARTHROSCOPY Left 1996  . TONSILLECTOMY      Home Medications:  Allergies as of 08/24/2019      Reactions   Avelox [moxifloxacin Hcl In Nacl] Anaphylaxis   Bactrim [sulfamethoxazole-trimethoprim] Anaphylaxis   Ciprofloxacin Other (See Comments)   Pt states she was told never to take this as it is in the same family as Avelox.    Depakote [  divalproex Sodium]    Imitrex [sumatriptan] Other (See Comments)   Neck and shoulder pain   Stadol [butorphanol] Rash      Medication List       Accurate as of August 24, 2019  8:52 AM. If you have any questions, ask your nurse or doctor.        ALPRAZolam 0.5 MG tablet Commonly known as: XANAX TAKE 1 TABLET BY MOUTH EVERY 6 HOURS AS NEEDED FOR ANXIETY NEED  TO  SCHEDULE  APPOINTMENT   buPROPion 150 MG 24 hr tablet Commonly known as: WELLBUTRIN XL Take 3 tablets by mouth once daily   estradiol 0.1 MG/GM vaginal cream Commonly known as: ESTRACE VAGINAL Apply 0.69m (pea-sized amount)  just inside the vaginal introitus with a finger-tip on Monday,  Wednesday and Friday nights.   furosemide 20 MG tablet Commonly known as: LASIX Take 1 tablet (20 mg total) by mouth daily.   glipiZIDE 5 MG tablet Commonly known as: GLUCOTROL TAKE 1 TABLET BY MOUTH TWICE DAILY WITH MEALS . APPOINTMENT REQUIRED FOR FUTURE REFILLS   lamoTRIgine 200 MG tablet Commonly known as: LAMICTAL TAKE 2 TABLETS BY MOUTH AT BEDTIME   metFORMIN 1000 MG tablet Commonly known as: GLUCOPHAGE TAKE 1 TABLET BY MOUTH TWICE DAILY WITH MEALS   nitrofurantoin 100 MG capsule Commonly known as: Macrodantin Take 1 capsule (100 mg total) by mouth daily.   ONE TOUCH ULTRA TEST test strip Generic drug: glucose blood daily.   pramipexole 0.5 MG tablet Commonly known as: MIRAPEX Take 1 tablet (0.5 mg total) by mouth at bedtime.   promethazine 25 MG tablet Commonly known as: PHENERGAN Take 1 tablet (25 mg total) by mouth every 8 (eight) hours as needed for nausea or vomiting.   risperiDONE 3 MG tablet Commonly known as: RISPERDAL TAKE 1 TABLET BY MOUTH AT BEDTIME   sertraline 100 MG tablet Commonly known as: ZOLOFT TAKE 1 TABLET BY MOUTH IN THE MORNING   zolpidem 12.5 MG CR tablet Commonly known as: AMBIEN CR TAKE 1 TABLET BY MOUTH AT BEDTIME AS NEEDED FOR SLEEP       Allergies:  Allergies  Allergen Reactions  . Avelox [Moxifloxacin Hcl In Nacl] Anaphylaxis  . Bactrim [Sulfamethoxazole-Trimethoprim] Anaphylaxis  . Ciprofloxacin Other (See Comments)    Pt states she was told never to take this as it is in the same family as Avelox.   . Depakote [Divalproex Sodium]   . Imitrex [Sumatriptan] Other (See Comments)    Neck and shoulder pain  . Stadol [Butorphanol] Rash    Family History: Family History  Problem Relation Age of Onset  . Stroke Father   . Heart failure Sister   . Bladder Cancer Neg Hx   . Kidney disease Neg Hx   . Prostate cancer Neg Hx   . Kidney cancer Neg Hx     Social History:  reports that she quit smoking about 44 years ago.  Her smoking use included cigarettes. She smoked 0.50 packs per day. She has never used smokeless tobacco. She reports that she does not drink alcohol or use drugs.  ROS: UROLOGY Frequent Urination?: Yes Hard to postpone urination?: No Burning/pain with urination?: Yes Get up at night to urinate?: Yes Leakage of urine?: No Urine stream starts and stops?: No Trouble starting stream?: No Do you have to strain to urinate?: No Blood in urine?: No Urinary tract infection?: Yes Sexually transmitted disease?: No Injury to kidneys or bladder?: No Painful intercourse?: No Weak stream?: No Currently  pregnant?: No Vaginal bleeding?: No Last menstrual period?: n  Gastrointestinal Nausea?: No Vomiting?: No Indigestion/heartburn?: No Diarrhea?: No Constipation?: No  Constitutional Fever: No Night sweats?: No Weight loss?: No Fatigue?: No  Skin Skin rash/lesions?: No Itching?: No  Eyes Blurred vision?: No Double vision?: No  Ears/Nose/Throat Sore throat?: No Sinus problems?: No  Hematologic/Lymphatic Swollen glands?: No Easy bruising?: No  Cardiovascular Leg swelling?: No Chest pain?: No  Respiratory Cough?: No Shortness of breath?: No  Endocrine Excessive thirst?: No  Musculoskeletal Back pain?: No Joint pain?: No  Neurological Headaches?: No Dizziness?: No  Psychologic Depression?: No Anxiety?: No  Physical Exam: BP 118/79   Pulse (!) 109   Ht 5' 1"  (1.549 m)   Wt 63.5 kg   LMP  (LMP Unknown)   BMI 26.45 kg/m   Constitutional:  Alert and oriented, No acute distress.   Laboratory Data: Lab Results  Component Value Date   WBC 4.7 07/01/2019   HGB 11.0 (L) 07/01/2019   HCT 35.1 07/01/2019   MCV 87 07/01/2019   PLT 488 (H) 07/01/2019    Lab Results  Component Value Date   CREATININE 0.80 07/01/2019    No results found for: PSA  No results found for: TESTOSTERONE  Lab Results  Component Value Date   HGBA1C 6.4 (H) 07/01/2019     Urinalysis    Component Value Date/Time   COLORURINE STRAW (A) 05/22/2015 1431   APPEARANCEUR Cloudy (A) 08/13/2019 1509   LABSPEC 1.004 (L) 05/22/2015 1431   LABSPEC 1.003 01/09/2012 2126   PHURINE 6.0 05/22/2015 1431   GLUCOSEU Negative 08/13/2019 1509   GLUCOSEU Negative 01/09/2012 2126   HGBUR 1+ (A) 05/22/2015 1431   BILIRUBINUR Negative 08/13/2019 1509   BILIRUBINUR Negative 01/09/2012 2126   KETONESUR NEGATIVE 05/22/2015 1431   PROTEINUR Negative 08/13/2019 1509   PROTEINUR NEGATIVE 05/22/2015 1431   NITRITE Positive (A) 08/13/2019 1509   NITRITE NEGATIVE 05/22/2015 1431   LEUKOCYTESUR Trace (A) 08/13/2019 1509   LEUKOCYTESUR Negative 01/09/2012 2126    Pertinent Imaging:   Assessment & Plan: See in 64-month 1. Dysuria  - Urinalysis, Complete   No follow-ups on file.  SReece Packer MD  [address] [place] [phone]

## 2019-08-24 NOTE — Telephone Encounter (Signed)
Called pt to schedule appt per Apolonio Schneiders, no answer, left vm

## 2019-08-25 NOTE — Telephone Encounter (Signed)
LVM for pt to call back.

## 2019-08-26 ENCOUNTER — Ambulatory Visit: Payer: Self-pay | Admitting: Physician Assistant

## 2019-08-28 ENCOUNTER — Encounter: Payer: Self-pay | Admitting: Family Medicine

## 2019-08-28 LAB — CULTURE, URINE COMPREHENSIVE

## 2019-08-28 NOTE — Telephone Encounter (Signed)
LVM for pt to call back.

## 2019-08-28 NOTE — Telephone Encounter (Signed)
Letter sent through [place] and printed to mail

## 2019-08-31 ENCOUNTER — Telehealth: Payer: Self-pay | Admitting: *Deleted

## 2019-08-31 NOTE — Telephone Encounter (Addendum)
Left Vm to return call.  ----- Message from Bjorn Loser, MD sent at 08/30/2019 12:36 PM EST ----- Macrodantin 100 mg bid for 7 days Then go back to daily triprim      ----- Message ----- From: Shanon Ace, CMA Sent: 08/28/2019   1:05 PM EST To: Bjorn Loser, MD   ----- Message ----- From: Interface, [facility] Lab Results In Sent: 08/24/2019   4:36 PM EST To: Rowe [facility] Clinical

## 2019-09-03 NOTE — Telephone Encounter (Signed)
2nd attempt to contact patient, left Vm to return call

## 2019-09-11 ENCOUNTER — Ambulatory Visit (INDEPENDENT_AMBULATORY_CARE_PROVIDER_SITE_OTHER): Payer: Self-pay | Admitting: Family Medicine

## 2019-09-11 ENCOUNTER — Encounter: Payer: Self-pay | Admitting: Family Medicine

## 2019-09-11 ENCOUNTER — Other Ambulatory Visit: Payer: Self-pay

## 2019-09-11 VITALS — BP 102/71 | HR 102 | Temp 98.1°F | Ht 61.0 in | Wt 142.0 lb

## 2019-09-11 DIAGNOSIS — G2581 Restless legs syndrome: Secondary | ICD-10-CM

## 2019-09-11 DIAGNOSIS — R110 Nausea: Secondary | ICD-10-CM

## 2019-09-11 MED ORDER — PRAMIPEXOLE DIHYDROCHLORIDE 1 MG PO TABS: 1.0000 mg | ORAL_TABLET | Freq: Every day | ORAL | 1 refills | Status: DC

## 2019-09-11 MED ORDER — PROMETHAZINE HCL 25 MG PO TABS: 25.0000 mg | ORAL_TABLET | Freq: Three times a day (TID) | ORAL | 1 refills | Status: DC | PRN

## 2019-09-11 NOTE — Assessment & Plan Note (Signed)
Increase mirapex to 1 mg QHS prn and continue to monitor closely

## 2019-09-11 NOTE — Progress Notes (Signed)
BP 102/71   Pulse (!) 102   Temp 98.1 F (36.7 C) (Oral)   Ht 5' 1"  (1.549 m)   Wt 142 lb (64.4 kg)   LMP  (LMP Unknown)   SpO2 95%   BMI 26.83 kg/m    Subjective:    Patient ID: Amy Hansen, female    DOB: [birth year], 64 y.o.   MRN: [id number]  HPI: Amy Hansen is a 64 y.o. female  Chief Complaint  Patient presents with  . Diabetes  . RLS  . Medication Refill   Patient presenting today following up on RLS after restarting mirapex. Taking mirapex as needed at bedtime, taking about 3-4 times per month. Taking 2 of the 0.5 mg tabs at a time usually to get good relief. Denies side effects or new concerns.   Stomach issues have gotten much better the last few months - following with GI for these. Still having occasional dry heaves for which she takes phenergan.   Relevant past medical, surgical, family and social history reviewed and updated as indicated. Interim medical history since our last visit reviewed. Allergies and medications reviewed and updated.  Review of Systems  Per HPI unless specifically indicated above     Objective:    BP 102/71   Pulse (!) 102   Temp 98.1 F (36.7 C) (Oral)   Ht 5' 1"  (1.549 m)   Wt 142 lb (64.4 kg)   LMP  (LMP Unknown)   SpO2 95%   BMI 26.83 kg/m   Wt Readings from Last 3 Encounters:  09/11/19 142 lb (64.4 kg)  08/24/19 140 lb (63.5 kg)  08/13/19 139 lb (63 kg)    Physical Exam Vitals and nursing note reviewed.  Constitutional:      Appearance: Normal appearance. She is not ill-appearing.  HENT:     Head: Atraumatic.  Eyes:     Extraocular Movements: Extraocular movements intact.     Conjunctiva/sclera: Conjunctivae normal.  Cardiovascular:     Rate and Rhythm: Normal rate and regular rhythm.     Heart sounds: Normal heart sounds.  Pulmonary:     Effort: Pulmonary effort is normal.     Breath sounds: Normal breath sounds.  Abdominal:     General: Bowel sounds are normal. There is no distension.   Palpations: Abdomen is soft.     Tenderness: There is no abdominal tenderness.  Musculoskeletal:        General: Normal range of motion.     Cervical back: Normal range of motion and neck supple.  Skin:    General: Skin is warm and dry.  Neurological:     Mental Status: She is alert and oriented to person, place, and time.  Psychiatric:        Mood and Affect: Mood normal.        Thought Content: Thought content normal.        Judgment: Judgment normal.     Results for orders placed or performed in visit on 08/24/19  CULTURE, URINE COMPREHENSIVE   Specimen: Urine   UR  Result Value Ref Range   Urine Culture, Comprehensive Final report (A)    Organism ID, Bacteria Enterococcus faecalis (A)    ANTIMICROBIAL SUSCEPTIBILITY Comment   Microscopic Examination   URINE  Result Value Ref Range   WBC, UA 0-5 0 - 5 /hpf   RBC None seen 0 - 2 /hpf   Epithelial Cells (non renal) 0-10 0 - 10 /hpf   Renal  Epithel, UA 0-10 (A) None seen /hpf   Casts Present (A) None seen /lpf   Cast Type Hyaline casts N/A   Bacteria, UA None seen None seen/Few  Urinalysis, Complete  Result Value Ref Range   Specific Gravity, UA 1.015 1.005 - 1.030   pH, UA 5.5 5.0 - 7.5   Color, UA Yellow Yellow   Appearance Ur Clear Clear   Leukocytes,UA Negative Negative   Protein,UA Negative Negative/Trace   Glucose, UA Negative Negative   Ketones, UA Negative Negative   RBC, UA Negative Negative   Bilirubin, UA Negative Negative   Urobilinogen, Ur 0.2 0.2 - 1.0 mg/dL   Nitrite, UA Negative Negative   Microscopic Examination See below:       Assessment & Plan:   Problem List Items Addressed This Visit      Other   Restless legs syndrome (RLS) - Primary    Increase mirapex to 1 mg QHS prn and continue to monitor closely       Other Visit Diagnoses    Nausea       Following with GI, on prn phenergan wtih good relief. Continue current regimen       Follow up plan: Return in about 4 months (around  01/09/2020) for 6 month f/u.

## 2019-09-16 ENCOUNTER — Telehealth: Payer: Self-pay | Admitting: Internal Medicine

## 2019-09-16 NOTE — Telephone Encounter (Signed)
No answer. Left message to call back.   

## 2019-09-16 NOTE — Telephone Encounter (Signed)
Patient calling to discuss recent zio testing results   Please call

## 2019-09-17 MED ORDER — METOPROLOL SUCCINATE ER 25 MG PO TB24: 12.5000 mg | ORAL_TABLET | Freq: Every day | ORAL | 1 refills | Status: DC

## 2019-09-17 NOTE — Telephone Encounter (Signed)
S/w patient and she verbalized understanding of the ZIO results. She will think about trying the metoprolol succinate and would like me to go ahead and send to her pharmacy.  Rx sent. She is aware of upcoming appointment date and time and will call back if she needs to reschedule.

## 2019-09-18 ENCOUNTER — Ambulatory Visit (INDEPENDENT_AMBULATORY_CARE_PROVIDER_SITE_OTHER): Payer: Self-pay | Admitting: Physician Assistant

## 2019-09-18 ENCOUNTER — Other Ambulatory Visit: Payer: Self-pay

## 2019-09-18 ENCOUNTER — Encounter: Payer: Self-pay | Admitting: Physician Assistant

## 2019-09-18 DIAGNOSIS — F411 Generalized anxiety disorder: Secondary | ICD-10-CM

## 2019-09-18 DIAGNOSIS — F319 Bipolar disorder, unspecified: Secondary | ICD-10-CM

## 2019-09-18 DIAGNOSIS — G4700 Insomnia, unspecified: Secondary | ICD-10-CM

## 2019-09-18 MED ORDER — ZOLPIDEM TARTRATE ER 12.5 MG PO TBCR: 12.5000 mg | EXTENDED_RELEASE_TABLET | Freq: Every evening | ORAL | 2 refills | Status: DC | PRN

## 2019-09-18 MED ORDER — RISPERIDONE 3 MG PO TABS: 3.0000 mg | ORAL_TABLET | Freq: Every day | ORAL | 2 refills | Status: DC

## 2019-09-18 MED ORDER — BUPROPION HCL ER (XL) 150 MG PO TB24: 450.0000 mg | ORAL_TABLET | Freq: Every day | ORAL | 2 refills | Status: DC

## 2019-09-18 MED ORDER — SERTRALINE HCL 100 MG PO TABS: 150.0000 mg | ORAL_TABLET | Freq: Every day | ORAL | 2 refills | Status: DC

## 2019-09-18 MED ORDER — ALPRAZOLAM 0.5 MG PO TABS: 0.5000 mg | ORAL_TABLET | Freq: Four times a day (QID) | ORAL | 2 refills | Status: DC | PRN

## 2019-09-18 NOTE — Progress Notes (Signed)
[facility]  Patient ID: Amy Hansen,  MRN: [id number]  PCP: Volney American, PA-C  Date of Evaluation: 09/18/2019 Time spent:20 minutes  Chief Complaint:  Chief Complaint    Depression; Anxiety      HISTORY/CURRENT STATUS: HPI For routine med check.  Not doing well.  Feels hopeless at times. Her daughter has divorced her husband and moved back.  Pt has been working at [employer] 2 days a week, trying to support her and her kids. That has put a strain on her and her husband. Has low energy and motivation. Job wears her down so bad.  When she gets home, she's so tired. Doesn't cry easily.  Has thoughts about 'not being here' but doesn't have plans to kill herself. No HI.  Patient denies increased energy with decreased need for sleep, no increased talkativeness, no racing thoughts, no impulsivity or risky behaviors, no increased spending, no increased libido, no grandiosity. No AH/VH.  Still is anxious a lot. The Xanax helps a lot. "I'm just going through a lot and I'll admit to taking 5-6 per day sometimes."  Sleeps okay most of the time with the Ambien.    Denies dizziness, syncope, seizures, numbness, tingling, tremor, tics, unsteady gait, slurred speech, confusion. Denies muscle or joint pain, stiffness, or dystonia.  Individual Medical History/ Review of Systems: Changes? :Yes  has had cardiac w/u recently, checked for DVT, and Holter monitor.    Past medications for mental health diagnoses include: Trazodone, Risperdal, Zoloft, Johnnye Sima, praises son, [employer], Prozac, Depakote, Lamictal, lithium, Wellbutrin, Xanax, Ambien, carbamazepine  Allergies: Avelox [moxifloxacin hcl in nacl], Bactrim [sulfamethoxazole-trimethoprim], Ciprofloxacin, Depakote [divalproex sodium], Imitrex [sumatriptan], and Stadol [butorphanol]  Current Medications:  Current Outpatient Medications:  .  ALPRAZolam (XANAX) 0.5 MG tablet, Take 1 tablet (0.5 mg total) by mouth 4 (four) times  daily as needed for anxiety., Disp: 120 tablet, Rfl: 2 .  buPROPion (WELLBUTRIN XL) 150 MG 24 hr tablet, Take 3 tablets (450 mg total) by mouth daily., Disp: 90 tablet, Rfl: 2 .  estradiol (ESTRACE VAGINAL) 0.1 MG/GM vaginal cream, Apply 0.32m (pea-sized amount)  just inside the vaginal introitus with a finger-tip on Monday, Wednesday and Friday nights., Disp: 30 g, Rfl: 12 .  furosemide (LASIX) 20 MG tablet, Take 1 tablet (20 mg total) by mouth daily., Disp: 30 tablet, Rfl: 11 .  glipiZIDE (GLUCOTROL) 5 MG tablet, TAKE 1 TABLET BY MOUTH TWICE DAILY WITH MEALS . APPOINTMENT REQUIRED FOR FUTURE REFILLS, Disp: 180 tablet, Rfl: 1 .  lamoTRIgine (LAMICTAL) 200 MG tablet, TAKE 2 TABLETS BY MOUTH AT BEDTIME, Disp: 180 tablet, Rfl: 1 .  metFORMIN (GLUCOPHAGE) 1000 MG tablet, TAKE 1 TABLET BY MOUTH TWICE DAILY WITH MEALS, Disp: 60 tablet, Rfl: 0 .  ONE TOUCH ULTRA TEST test strip, daily., Disp: , Rfl:  .  pramipexole (MIRAPEX) 1 MG tablet, Take 1 tablet (1 mg total) by mouth at bedtime., Disp: 30 tablet, Rfl: 1 .  promethazine (PHENERGAN) 25 MG tablet, Take 1 tablet (25 mg total) by mouth every 8 (eight) hours as needed for nausea or vomiting., Disp: 30 tablet, Rfl: 1 .  risperiDONE (RISPERDAL) 3 MG tablet, Take 1 tablet (3 mg total) by mouth at bedtime., Disp: 30 tablet, Rfl: 2 .  zolpidem (AMBIEN CR) 12.5 MG CR tablet, Take 1 tablet (12.5 mg total) by mouth at bedtime as needed. for sleep, Disp: 30 tablet, Rfl: 2 .  cephALEXin (KEFLEX) 250 MG capsule, Take 1 capsule (250 mg total) by  mouth 2 (two) times daily. (Patient not taking: Reported on 09/18/2019), Disp: 30 capsule, Rfl: 11 .  metoprolol succinate (TOPROL XL) 25 MG 24 hr tablet, Take 0.5 tablets (12.5 mg total) by mouth daily. (Patient not taking: Reported on 09/18/2019), Disp: 45 tablet, Rfl: 1 .  sertraline (ZOLOFT) 100 MG tablet, Take 1.5 tablets (150 mg total) by mouth daily., Disp: 45 tablet, Rfl: 2 Medication Side Effects: none  Family Medical/  Social History: Changes? Yes Her Mom died last fall  [place]:  There were no vitals taken for this visit.There is no height or weight on file to calculate BMI.  General Appearance: Casual, Neat, Well Groomed and Obese  Eye Contact:  Good  Speech:  Clear and Coherent  Volume:  Normal  Mood:  Depressed and Hopeless  Affect:  Depressed  Thought Process:  Goal Directed and Descriptions of Associations: Intact  Orientation:  Full (Time, Place, and Person)  Thought Content: Logical   Suicidal Thoughts:  Yes.  without intent/plan  Homicidal Thoughts:  No  Memory:  WNL  Judgement:  Good  Insight:  Good  Psychomotor Activity:  Normal  Concentration:  Concentration: Good and Attention Span: Good  Recall:  Good  Fund of Knowledge: Good  Language: Good  Assets:  Desire for Improvement  ADL's:  Intact  Cognition: WNL  Prognosis:  Good    DIAGNOSES:    ICD-10-CM   1. Bipolar disorder with depression ([place])  F31.9   2. Generalized anxiety disorder  F41.1   3. Insomnia, unspecified type  G47.00     Receiving Psychotherapy: No    RECOMMENDATIONS:  Contract for safety is in place.  She knows to go to [place] [facility] emergency [facility] or [facility] if she becomes suicidal with a plan. PDMP reviewed.  I spent 20 minutes with her. Increase Zoloft 100 mg, 1.5 pills daily. Continue Xanax 0.5 mg 4 times daily as needed.  Do not overtake!  She understands.  She is going to get her husband to be in control of the Xanax.  She understands that there is a correlation between high-dose benzos for a long period of time and possible dementia.  She accepts those risks. Continue Wellbutrin XL 150 mg, 3 p.o. daily. Continue Lamictal 200 mg, 1 p.o. twice daily. Continue Mirapex 1 mg nightly (from another provider). Continue Risperdal 3 mg nightly. Continue Ambien CR 12.5 mg nightly as needed. Recommend counseling. Return in 6 weeks.  Donnal Moat, PA-C

## 2019-09-21 ENCOUNTER — Ambulatory Visit: Payer: Self-pay | Admitting: Internal Medicine

## 2019-10-06 ENCOUNTER — Ambulatory Visit: Payer: Self-pay | Admitting: Physician Assistant

## 2019-10-16 ENCOUNTER — Ambulatory Visit: Payer: Self-pay | Attending: Internal Medicine

## 2019-10-16 DIAGNOSIS — Z23 Encounter for immunization: Secondary | ICD-10-CM

## 2019-10-16 NOTE — Progress Notes (Signed)
   Covid-19 Vaccination Clinic  Name:  Amy Hansen    MRN: [id number] DOB: 1955-12-17  10/16/2019  [name] was observed post Covid-19 immunization for 15 minutes without incident. She was provided with Vaccine Information Sheet and instruction to access the V-Safe system.   [name] was instructed to call 911 with any severe reactions post vaccine: [name] Difficulty breathing  . Swelling of face and throat  . A fast heartbeat  . A bad rash all over body  . Dizziness and weakness   Immunizations Administered    Name Date Dose VIS Date Route   Pfizer COVID-19 Vaccine 10/16/2019  8:51 AM 0.3 mL 07/10/2019 Intramuscular   Manufacturer: [place]   Lot: [id number]   [place]: [id number]

## 2019-10-23 ENCOUNTER — Other Ambulatory Visit: Payer: Self-pay | Admitting: Family Medicine

## 2019-11-10 ENCOUNTER — Ambulatory Visit: Payer: Self-pay

## 2019-11-10 ENCOUNTER — Ambulatory Visit: Payer: Self-pay | Attending: Internal Medicine

## 2019-11-10 DIAGNOSIS — Z23 Encounter for immunization: Secondary | ICD-10-CM

## 2019-11-10 NOTE — Progress Notes (Signed)
   Covid-19 Vaccination Clinic  Name:  Amy Hansen    MRN: [id number] DOB: [birth year]  11/10/2019  Amy Hansen was observed post Covid-19 immunization for 15 minutes without incident. She was provided with Vaccine Information Sheet and instruction to access the V-Safe system.   Amy Hansen was instructed to call 911 with any severe reactions post vaccine: [name] Difficulty breathing  . Swelling of face and throat  . A fast heartbeat  . A bad rash all over body  . Dizziness and weakness   Immunizations Administered    Name Date Dose VIS Date Route   Pfizer COVID-19 Vaccine 11/10/2019 10:27 AM 0.3 mL 07/10/2019 Intramuscular   Manufacturer: [place]   Lot: [id number]   [place]: [id number]

## 2019-11-27 ENCOUNTER — Telehealth: Payer: Self-pay | Admitting: Family Medicine

## 2019-11-27 ENCOUNTER — Encounter: Payer: Self-pay | Admitting: Family Medicine

## 2019-11-27 NOTE — Telephone Encounter (Signed)
Letter generated, called pt and told her letter is ready she states she will come Monday

## 2019-11-27 NOTE — Telephone Encounter (Signed)
OK to generate note stating she should have access to water during day at her desk

## 2019-11-27 NOTE — Telephone Encounter (Signed)
Copied from [place] [id number]. Topic: General - Other >> Nov 27, 2019  9:03 AM [name] wrote: Reason for CRM: Patient would like to speak with the nurse regarding her medication at work and she stated that she needs to have fluid at her desk during the day.  Her employer said that she will need a note from the doctor.  Please advise and call patient to discuss at [phone]

## 2019-11-30 ENCOUNTER — Other Ambulatory Visit: Payer: Self-pay

## 2019-11-30 ENCOUNTER — Ambulatory Visit: Payer: Self-pay

## 2019-11-30 VITALS — BP 111/73 | HR 105 | Temp 98.0°F | Ht 61.0 in | Wt 129.7 lb

## 2019-11-30 DIAGNOSIS — N390 Urinary tract infection, site not specified: Secondary | ICD-10-CM

## 2019-11-30 LAB — URINALYSIS, COMPLETE
Bilirubin, UA: NEGATIVE
Ketones, UA: NEGATIVE
Leukocytes,UA: NEGATIVE
Nitrite, UA: POSITIVE — AB
Protein,UA: NEGATIVE
RBC, UA: NEGATIVE
Specific Gravity, UA: 1.015 (ref 1.005–1.030)
Urobilinogen, Ur: 0.2 mg/dL (ref 0.2–1.0)
pH, UA: 7 (ref 5.0–7.5)

## 2019-11-30 LAB — MICROSCOPIC EXAMINATION: RBC, Urine: NONE SEEN /hpf (ref 0–2)

## 2019-11-30 MED ORDER — NITROFURANTOIN MACROCRYSTAL 100 MG PO CAPS: 100.0000 mg | ORAL_CAPSULE | Freq: Two times a day (BID) | ORAL | 0 refills | Status: AC

## 2019-11-30 NOTE — Progress Notes (Signed)
Urine Drop Off  Patient is present today complaining of dysuria, frequency, incontinence and urgency. Patient also complains of nausea and night sweats. Patient states that symptoms have been present for several days. Patient is taking her suppressive antibiotic Keflex 272m BID. Confirmed patient's allergies and pharmacy. Will review urine results with provider and call patient for further instruction.   Spoke with Dr. MMatilde Sprangwho reviewed U/A and gave verbal orders for me to send in Macrodantin 1033m BID x 7 days. RX sent. Urine today was positive for nitrites and many bacteria. Urine was sent for culture. Antibiotics given today will call with culture results. Patient gave verbal understanding.

## 2019-12-01 NOTE — Telephone Encounter (Signed)
Detailed VM per DPR left with information below.

## 2019-12-01 NOTE — Telephone Encounter (Signed)
Pt states she wants note to say access to beverages not only water.Pt states she wants to be able to drink tea and coffee as well.

## 2019-12-01 NOTE — Telephone Encounter (Signed)
There is no medical necessity to her being allowed access to tea or water while at work. Will not write a medical note for this. She is allowed water to stay hydrated.

## 2019-12-02 LAB — CULTURE, URINE COMPREHENSIVE

## 2019-12-03 ENCOUNTER — Telehealth: Payer: Self-pay | Admitting: *Deleted

## 2019-12-03 MED ORDER — DOXYCYCLINE HYCLATE 100 MG PO CAPS
100.0000 mg | ORAL_CAPSULE | Freq: Two times a day (BID) | ORAL | 0 refills | Status: DC
Start: 2019-12-03 — End: 2019-12-15

## 2019-12-03 NOTE — Telephone Encounter (Addendum)
Patient informed, RX sent in to [facility] as requested.  Voiced understanding.   ----- Message from Bjorn Loser, MD sent at 12/02/2019  5:37 PM EDT ----- Doxycycline 100 mg twice a day for 1 weekThen restart daily prophylactic antibiotics  ----- Message ----- From: Shanon Ace, CMA Sent: 12/02/2019   4:47 PM EDT To: Bjorn Loser, MD   ----- Message ----- From: Interface, [facility] Lab Results In Sent: 11/30/2019   4:37 PM EDT To: [facility]

## 2019-12-07 ENCOUNTER — Telehealth: Payer: Self-pay | Admitting: Urology

## 2019-12-07 NOTE — Telephone Encounter (Signed)
Pt states she now has a yeast infection from the antibiotics given for UTI, asks for a Rx for Diflucan to be sent to [facility] on [address] in [place]. Please advise.

## 2019-12-08 NOTE — Telephone Encounter (Signed)
Please send diflucan 100 mg daily for 3 days

## 2019-12-09 MED ORDER — FLUCONAZOLE 100 MG PO TABS
100.0000 mg | ORAL_TABLET | Freq: Every day | ORAL | 0 refills | Status: DC
Start: 2019-12-09 — End: 2019-12-14

## 2019-12-09 NOTE — Telephone Encounter (Signed)
Left patient a VM with message details, RX sent to [facility].

## 2019-12-14 ENCOUNTER — Emergency Department
Admission: EM | Admit: 2019-12-14 | Discharge: 2019-12-14 | Disposition: A | Discharge status: Home / Self Care | Payer: Self-pay | Attending: Emergency Medicine | Admitting: Emergency Medicine

## 2019-12-14 ENCOUNTER — Emergency Department: Payer: Self-pay

## 2019-12-14 ENCOUNTER — Encounter: Payer: Self-pay | Admitting: Family Medicine

## 2019-12-14 ENCOUNTER — Other Ambulatory Visit: Payer: Self-pay

## 2019-12-14 ENCOUNTER — Ambulatory Visit (INDEPENDENT_AMBULATORY_CARE_PROVIDER_SITE_OTHER): Payer: Self-pay | Admitting: Family Medicine

## 2019-12-14 ENCOUNTER — Encounter: Payer: Self-pay | Admitting: Emergency Medicine

## 2019-12-14 VITALS — BP 102/69 | HR 102 | Temp 98.1°F | Wt 121.0 lb

## 2019-12-14 DIAGNOSIS — R630 Anorexia: Secondary | ICD-10-CM

## 2019-12-14 DIAGNOSIS — Z79899 Other long term (current) drug therapy: Secondary | ICD-10-CM | POA: Insufficient documentation

## 2019-12-14 DIAGNOSIS — R634 Abnormal weight loss: Secondary | ICD-10-CM

## 2019-12-14 DIAGNOSIS — R410 Disorientation, unspecified: Secondary | ICD-10-CM

## 2019-12-14 DIAGNOSIS — E1165 Type 2 diabetes mellitus with hyperglycemia: Secondary | ICD-10-CM | POA: Insufficient documentation

## 2019-12-14 DIAGNOSIS — R739 Hyperglycemia, unspecified: Secondary | ICD-10-CM

## 2019-12-14 DIAGNOSIS — I5032 Chronic diastolic (congestive) heart failure: Secondary | ICD-10-CM | POA: Insufficient documentation

## 2019-12-14 DIAGNOSIS — Z87891 Personal history of nicotine dependence: Secondary | ICD-10-CM | POA: Insufficient documentation

## 2019-12-14 DIAGNOSIS — I130 Hypertensive heart and chronic kidney disease with heart failure and stage 1 through stage 4 chronic kidney disease, or unspecified chronic kidney disease: Secondary | ICD-10-CM | POA: Insufficient documentation

## 2019-12-14 DIAGNOSIS — N189 Chronic kidney disease, unspecified: Secondary | ICD-10-CM | POA: Insufficient documentation

## 2019-12-14 DIAGNOSIS — E1122 Type 2 diabetes mellitus with diabetic chronic kidney disease: Secondary | ICD-10-CM | POA: Insufficient documentation

## 2019-12-14 DIAGNOSIS — Z7984 Long term (current) use of oral hypoglycemic drugs: Secondary | ICD-10-CM | POA: Insufficient documentation

## 2019-12-14 DIAGNOSIS — R300 Dysuria: Secondary | ICD-10-CM

## 2019-12-14 LAB — GLUCOSE HEMOCUE WAIVED: Glu Hemocue Waived: >444 mg/dL (ref 65–99)

## 2019-12-14 LAB — CBC
HCT: 40 % (ref 36.0–46.0)
Hemoglobin: 12.6 g/dL (ref 12.0–15.0)
MCH: 22.1 pg — ABNORMAL LOW (ref 26.0–34.0)
MCHC: 31.5 g/dL (ref 30.0–36.0)
MCV: 70.3 fL — ABNORMAL LOW (ref 80.0–100.0)
Platelets: 469 10*3/uL — ABNORMAL HIGH (ref 150–400)
RBC: 5.69 MIL/uL — ABNORMAL HIGH (ref 3.87–5.11)
RDW: 15.3 % (ref 11.5–15.5)
WBC: 6.8 10*3/uL (ref 4.0–10.5)
nRBC: 0 % (ref 0.0–0.2)

## 2019-12-14 LAB — BLOOD GAS, VENOUS
Acid-base deficit: 3.9 mmol/L — ABNORMAL HIGH (ref 0.0–2.0)
Bicarbonate: 22.2 mmol/L (ref 20.0–28.0)
O2 Saturation: 66.7 %
Patient temperature: 37
pCO2, Ven: 43 mmHg — ABNORMAL LOW (ref 44.0–60.0)
pH, Ven: 7.32 (ref 7.250–7.430)
pO2, Ven: 38 mmHg (ref 32.0–45.0)

## 2019-12-14 LAB — URINALYSIS, COMPLETE (UACMP) WITH MICROSCOPIC
Bacteria, UA: NONE SEEN
Bilirubin Urine: NEGATIVE
Glucose, UA: >=500 mg/dL — AB
Hgb urine dipstick: NEGATIVE
Ketones, ur: NEGATIVE mg/dL
Leukocytes,Ua: NEGATIVE
Nitrite: NEGATIVE
Protein, ur: NEGATIVE mg/dL
Specific Gravity, Urine: 1.031 — ABNORMAL HIGH (ref 1.005–1.030)
pH: 6 (ref 5.0–8.0)

## 2019-12-14 LAB — BASIC METABOLIC PANEL
Anion gap: 12 (ref 5–15)
BUN: 14 mg/dL (ref 8–23)
CO2: 21 mmol/L — ABNORMAL LOW (ref 22–32)
Calcium: 8.7 mg/dL — ABNORMAL LOW (ref 8.9–10.3)
Chloride: 93 mmol/L — ABNORMAL LOW (ref 98–111)
Creatinine, Ser: 0.87 mg/dL (ref 0.44–1.00)
GFR calc Af Amer: >60 mL/min (ref >60)
GFR calc non Af Amer: >60 mL/min (ref >60)
Glucose, Bld: 571 mg/dL (ref 70–99)
Potassium: 4.4 mmol/L (ref 3.5–5.1)
Sodium: 126 mmol/L — ABNORMAL LOW (ref 135–145)

## 2019-12-14 LAB — BAYER DCA HB A1C WAIVED: HB A1C (BAYER DCA - WAIVED): >14 % — ABNORMAL HIGH (ref <7.0)

## 2019-12-14 LAB — CBC WITH DIFFERENTIAL/PLATELET
Hematocrit: 38.4 % (ref 34.0–46.6)
Hemoglobin: 12.5 g/dL (ref 11.1–15.9)
Lymphocytes Absolute: 1.2 10*3/uL (ref 0.7–3.1)
Lymphs: 21 %
MCH: 23.4 pg — ABNORMAL LOW (ref 26.6–33.0)
MCHC: 32.6 g/dL (ref 31.5–35.7)
MCV: 72 fL — ABNORMAL LOW (ref 79–97)
MID (Absolute): 0.3 10*3/uL (ref 0.1–1.6)
MID: 6 %
Neutrophils Absolute: 4.2 10*3/uL (ref 1.4–7.0)
Neutrophils: 73 %
Platelets: 501 10*3/uL — ABNORMAL HIGH (ref 150–450)
RBC: 5.35 x10E6/uL — ABNORMAL HIGH (ref 3.77–5.28)
RDW: 16.1 % — ABNORMAL HIGH (ref 11.7–15.4)
WBC: 5.7 10*3/uL (ref 3.4–10.8)

## 2019-12-14 LAB — GLUCOSE, CAPILLARY
Glucose-Capillary: 587 mg/dL (ref 70–99)
Glucose-Capillary: 334 mg/dL — ABNORMAL HIGH (ref 70–99)
Glucose-Capillary: 301 mg/dL — ABNORMAL HIGH (ref 70–99)

## 2019-12-14 IMAGING — CT CT HEAD W/O CM
3 series · 15 of 46 positions shown, 18 images · non-contrast
Comparison: [DATE]

CLINICAL DATA: Altered mental status, disorientation

EXAM:
CT HEAD WITHOUT CONTRAST
TECHNIQUE: Contiguous axial images were obtained from the base of the skull
through the vertex without intravenous contrast.

[Series 2: head wo · axial · 0.44mm/px · z∈[-136,-16]mm · 9 of 29 slices shown, 12 images]
[im 3/29  brain]
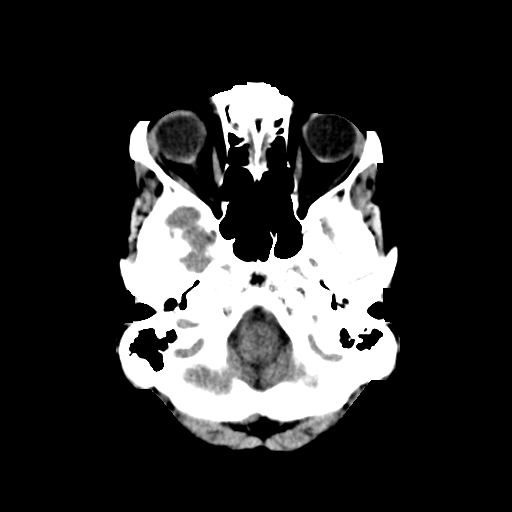
[im 3/29  bone]
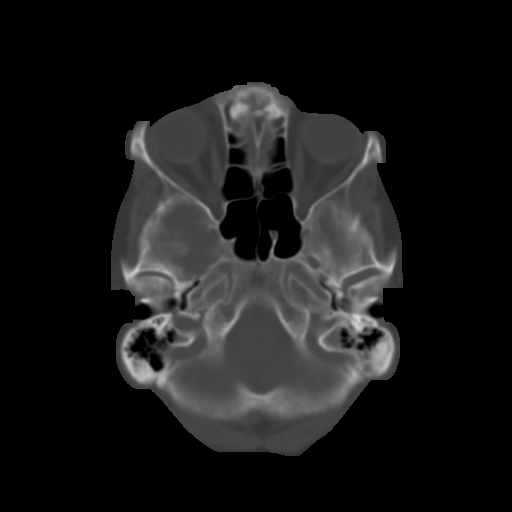
[im 6/29  brain]
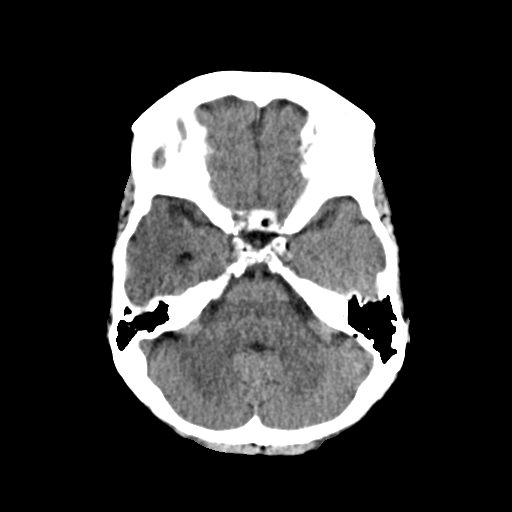
[im 9/29  brain]
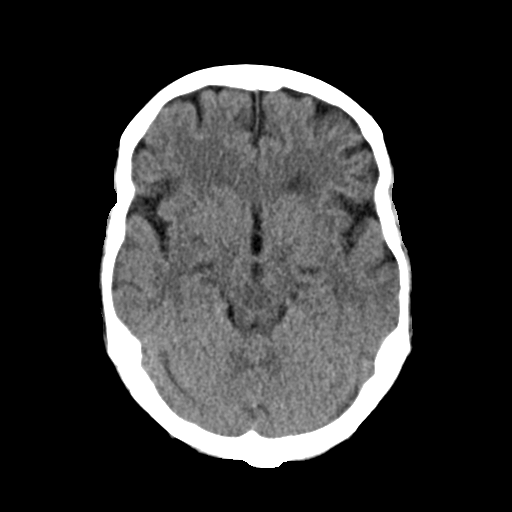
[im 12/29  brain]
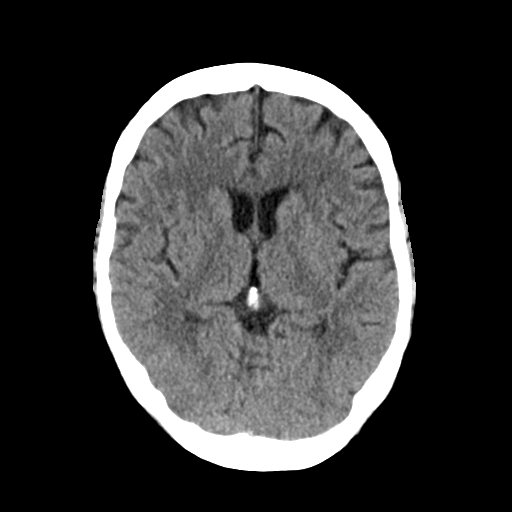
[im 15/29  brain]
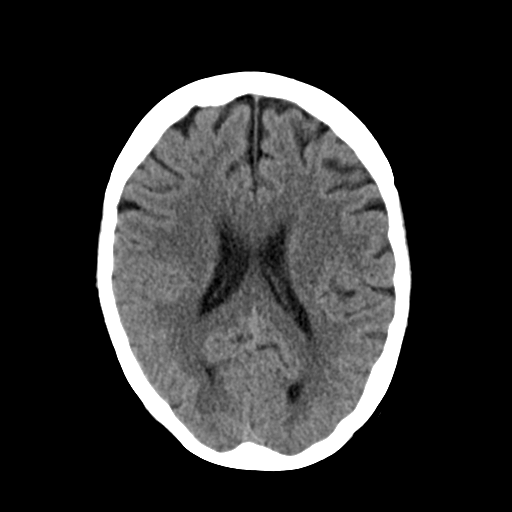
[im 15/29  bone]
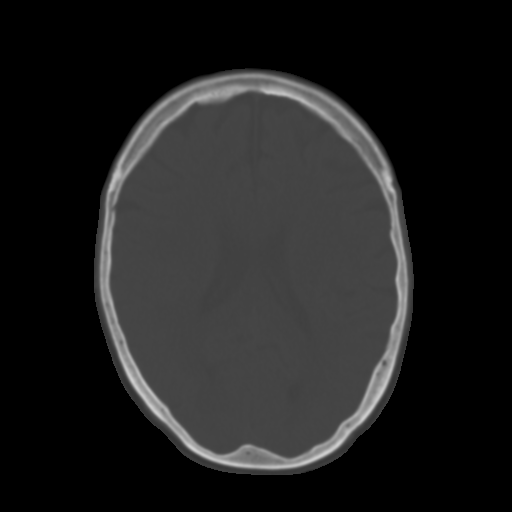
[im 18/29  brain]
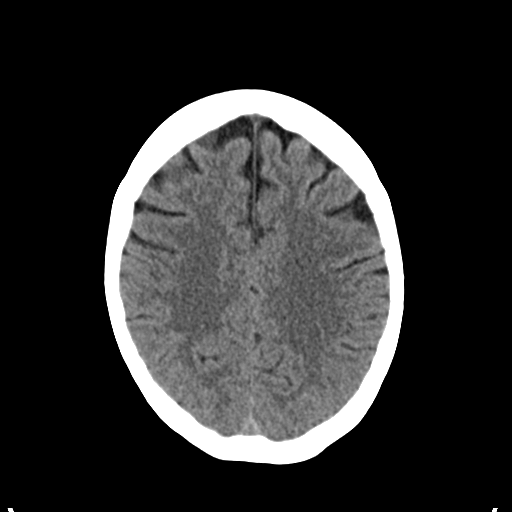
[im 21/29  brain]
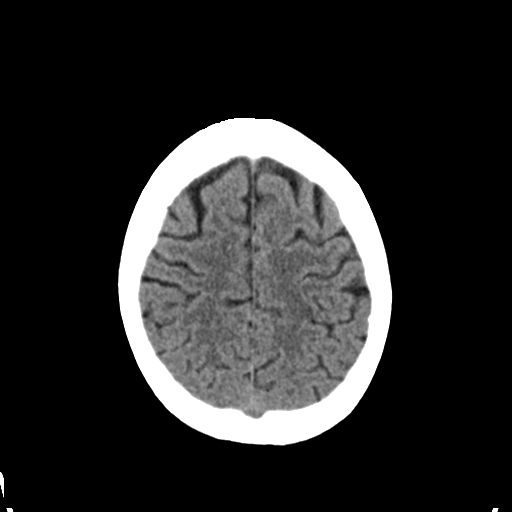
[im 24/29  brain]
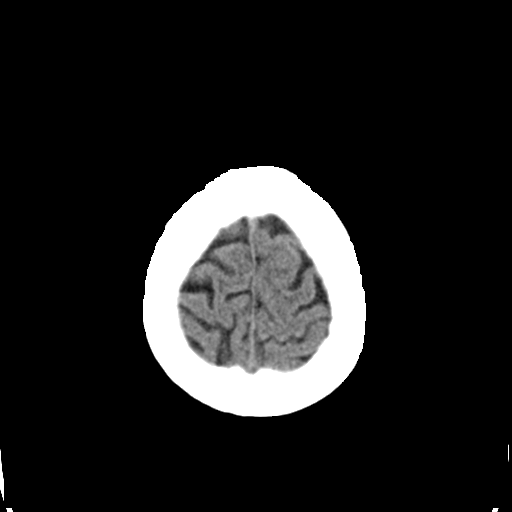
[im 27/29  brain]
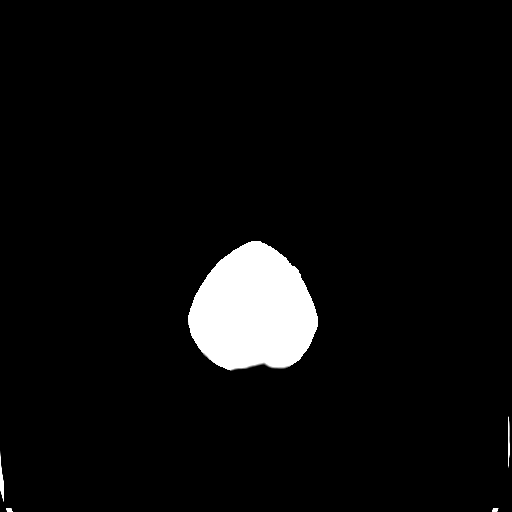
[im 27/29  bone]
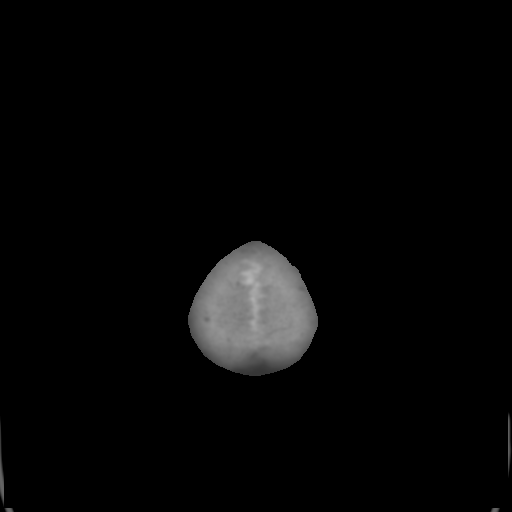

[Series 4: coronal soft tissue · coronal · 0.29mm/px · 3 of 60 slices shown]
[im 20/60  brain]
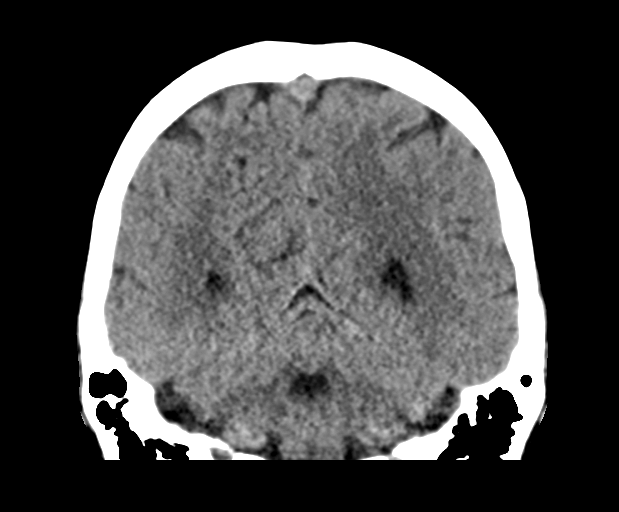
[im 27/60  brain]
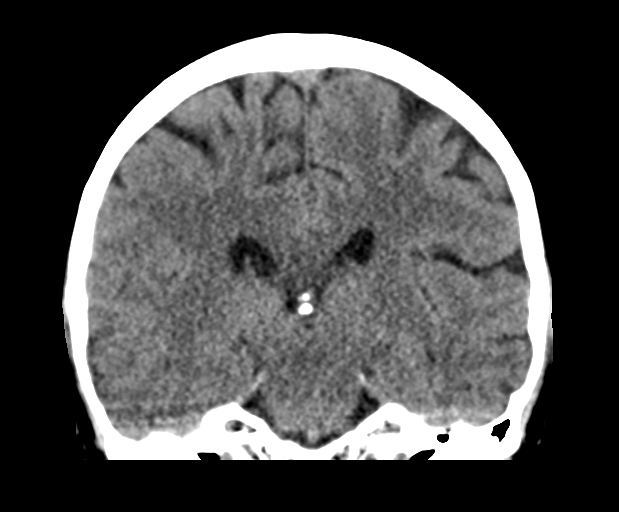
[im 33/60  brain]
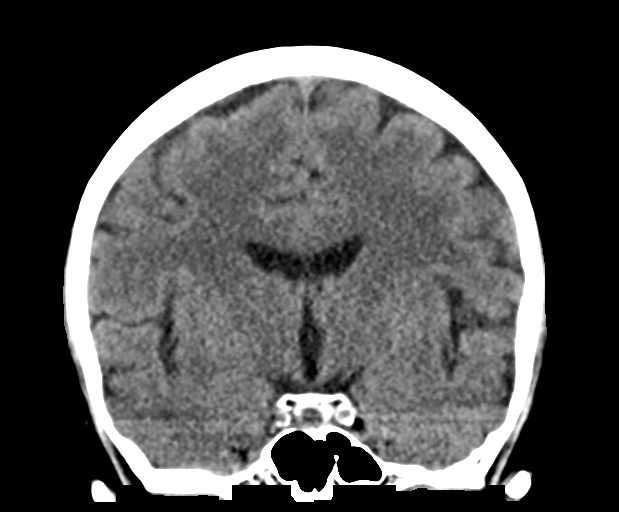

[Series 5: sagittal soft tissue · sagittal · 0.29mm/px · 3 of 48 slices shown]
[im 16/48  brain]
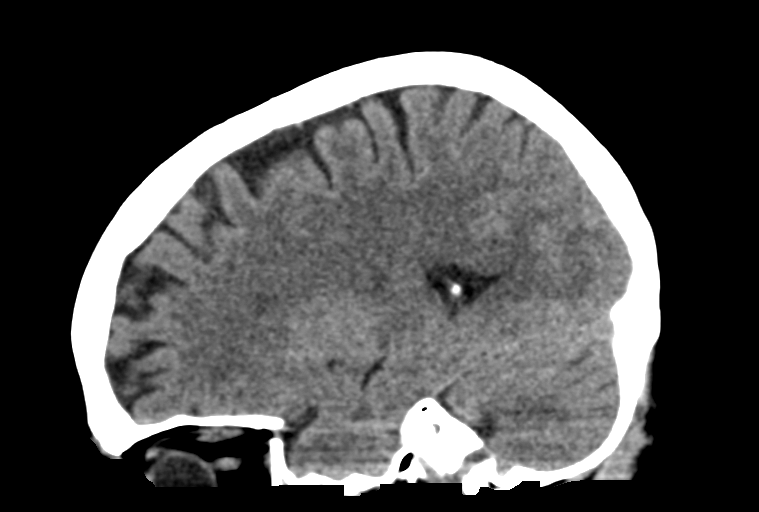
[im 24/48  brain]
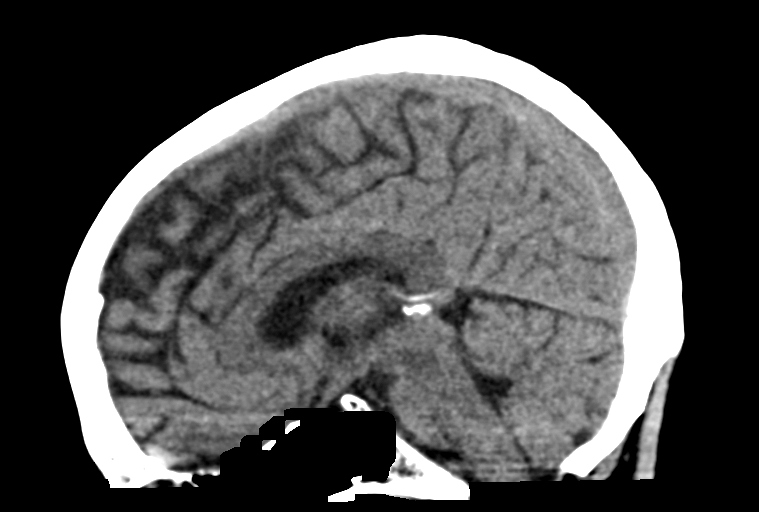
[im 32/48  brain]
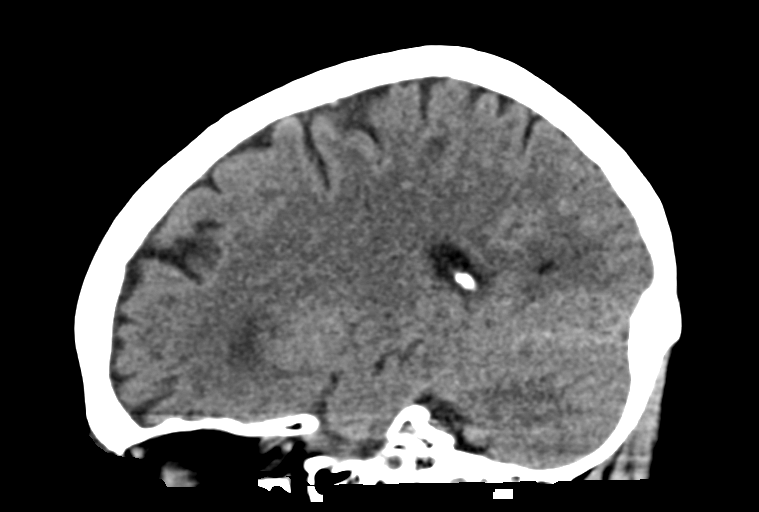

[15 of 46 positions shown; findings below may reference images not displayed]

FINDINGS: Brain: No evidence of acute infarction, hemorrhage, hydrocephalus,
extra-axial collection or mass lesion/mass effect. Mild
periventricular white matter hypodensity.

Vascular: No hyperdense vessel or unexpected calcification.

Skull: Normal. Negative for fracture or focal lesion.

Sinuses/Orbits: No acute finding.

Other: None.
IMPRESSION: No acute intracranial pathology.  Small-vessel white matter disease.

## 2019-12-14 MED ORDER — SODIUM CHLORIDE 0.9 % IV BOLUS
1000.0000 mL | Freq: Once | INTRAVENOUS | Status: AC
  Administered 2019-12-14: 1000 mL via INTRAVENOUS

## 2019-12-14 NOTE — Progress Notes (Signed)
BP 102/69   Pulse (!) 102   Temp 98.1 F (36.7 C) (Oral)   Wt 121 lb (54.9 kg)   LMP  (LMP Unknown)   SpO2 97%   BMI 22.86 kg/m    Subjective:    Patient ID: Amy Hansen, female    DOB: 1955-10-11, 64 y.o.   MRN: [id number]  HPI: Amy Hansen is a 64 y.o. female  Chief Complaint  Patient presents with  . Altered Mental Status    disoriented x over 2 weeks  . Dysuria   Patient presents today with husband, who provides most of the history given her altered mental status recently. About 2 weeks not feeling well, no appetite, no energy, disoriented off and on. Keeping down what she eats, but not eating much. Husband is unsure if she has been taking any of her medications recently, she typically manages these on her own. Does not typically check home BSs.  Known UTI currently, being treated with doxycycline by her Urologist. Has been keeping UTIs despite suppressive keflex regimen and per record review, 3+ glucose in urinalysis recently. Denies known fever, chills, sweats, CP, SOB.    Relevant past medical, surgical, family and social history reviewed and updated as indicated. Interim medical history since our last visit reviewed. Allergies and medications reviewed and updated.  Review of Systems  Per HPI unless specifically indicated above     Objective:    BP 102/69   Pulse (!) 102   Temp 98.1 F (36.7 C) (Oral)   Wt 121 lb (54.9 kg)   LMP  (LMP Unknown)   SpO2 97%   BMI 22.86 kg/m   Wt Readings from Last 3 Encounters:  12/15/19 120 lb (54.4 kg)  12/14/19 120 lb (54.4 kg)  12/14/19 121 lb (54.9 kg)    Physical Exam Vitals and nursing note reviewed.  Constitutional:      Appearance: She is ill-appearing.     Comments: Underweight  HENT:     Head: Atraumatic.  Eyes:     Extraocular Movements: Extraocular movements intact.     Conjunctiva/sclera: Conjunctivae normal.  Cardiovascular:     Rate and Rhythm: Regular rhythm. Tachycardia present.     Heart  sounds: Normal heart sounds.  Pulmonary:     Effort: Pulmonary effort is normal.     Breath sounds: Normal breath sounds.  Abdominal:     General: Bowel sounds are normal. There is no distension.     Palpations: Abdomen is soft.     Tenderness: There is no abdominal tenderness.  Musculoskeletal:     Cervical back: Normal range of motion and neck supple.     Comments: Weak, ambulating independently but appears more unsteady than baseline  Skin:    General: Skin is warm and dry.  Neurological:     Comments: Disoriented, withdrawn, not at baseline  Psychiatric:     Comments: Appears withdrawn, disoriented. Minimally conversative during interview     Results for orders placed or performed in visit on 12/14/19  Microscopic Examination   URINE  Result Value Ref Range   WBC, UA 11-30 (A) 0 - 5 /hpf   RBC None seen 0 - 2 /hpf   Epithelial Cells (non renal) 0-10 0 - 10 /hpf   Bacteria, UA Many (A) None seen/Few  Urine Culture, Reflex   URINE  Result Value Ref Range   Urine Culture, Routine Final report (A)    Organism ID, Bacteria Klebsiella aerogenes (A)  Antimicrobial Susceptibility Comment   UA/M w/rflx Culture, Routine   Specimen: Urine   URINE  Result Value Ref Range   Specific Gravity, UA <1.005 (L) 1.005 - 1.030   pH, UA 5.5 5.0 - 7.5   Color, UA Yellow Yellow   Appearance Ur Clear Clear   Leukocytes,UA Negative Negative   Protein,UA Negative Negative/Trace   Glucose, UA 3+ (A) Negative   Ketones, UA Negative Negative   RBC, UA Negative Negative   Bilirubin, UA Negative Negative   Urobilinogen, Ur 0.2 0.2 - 1.0 mg/dL   Nitrite, UA Positive (A) Negative   Microscopic Examination See below:    Urinalysis Reflex Comment   Comprehensive metabolic panel  Result Value Ref Range   Glucose 626 (HH) 65 - 99 mg/dL   BUN 13 8 - 27 mg/dL   Creatinine, Ser 0.75 0.57 - 1.00 mg/dL   GFR calc non Af Amer 86 >59 mL/min/1.73   GFR calc Af Amer 99 >59 mL/min/1.73    BUN/Creatinine Ratio 17 12 - 28   Sodium 127 (L) 134 - 144 mmol/L   Potassium 5.0 3.5 - 5.2 mmol/L   Chloride 94 (L) 96 - 106 mmol/L   CO2 18 (L) 20 - 29 mmol/L   Calcium 9.0 8.7 - 10.3 mg/dL   Total Protein 6.5 6.0 - 8.5 g/dL   Albumin 4.2 3.8 - 4.8 g/dL   Globulin, Total 2.3 1.5 - 4.5 g/dL   Albumin/Globulin Ratio 1.8 1.2 - 2.2   Bilirubin Total <0.2 0.0 - 1.2 mg/dL   Alkaline Phosphatase 107 48 - 121 IU/L   AST 16 0 - 40 IU/L   ALT 17 0 - 32 IU/L  Bayer DCA Hb A1c Waived  Result Value Ref Range   HB A1C (BAYER DCA - WAIVED) >14.0 (H) <7.0 %  Glucose Hemocue Waived  Result Value Ref Range   Glu Hemocue Waived >444 (HH) 65 - 99 mg/dL  CBC With Differential/Platelet  Result Value Ref Range   WBC 5.7 3.4 - 10.8 x10E3/uL   RBC 5.35 (H) 3.77 - 5.28 x10E6/uL   Hemoglobin 12.5 11.1 - 15.9 g/dL   Hematocrit 38.4 34.0 - 46.6 %   MCV 72 (L) 79 - 97 fL   MCH 23.4 (L) 26.6 - 33.0 pg   MCHC 32.6 31.5 - 35.7 g/dL   RDW 16.1 (H) 11.7 - 15.4 %   Platelets 501 (H) 150 - 450 x10E3/uL   Neutrophils 73 Not Estab. %   Lymphs 21 Not Estab. %   MID 6 Not Estab. %   Neutrophils Absolute 4.2 1.4 - 7.0 x10E3/uL   Lymphocytes Absolute 1.2 0.7 - 3.1 x10E3/uL   MID (Absolute) 0.3 0.1 - 1.6 X10E3/uL      Assessment & Plan:   Problem List Items Addressed This Visit      Endocrine   Poorly controlled type 2 diabetes mellitus ([place])    Unclear if or how she's been taking her medications recently. Glucose above register on point of care testing today, suspect this and significant dehydration are likely contributing to her disorientation, weight loss, poor appetite. Strongly recommending husband take her to ER for further evaluation, IV hydration and monitoring      Relevant Orders   Bayer DCA Hb A1c Waived (Completed)   Glucose Hemocue Waived (Completed)     Other   Dysuria   Relevant Orders   UA/M w/rflx Culture, Routine (Completed)    Other Visit Diagnoses    Disorientation    -  Primary    Suspect signifciant hyperglycemia and dehydration as culprit in addition to her current UTI but reccomend ER for further eval today, husband will drive her   Relevant Orders   Comprehensive metabolic panel (Completed)   Weight loss       Likely from poor PO intake and significantly uncontrolled DM. Work on both of thse and monitor for [employer] gain   Relevant Orders   CBC With Differential/Platelet (Completed)   Anorexia           Follow up plan: Return for ER f/u.

## 2019-12-14 NOTE — ED Provider Notes (Signed)
[facility] Emergency Department Provider Note   ____________________________________________   First MD Initiated Contact with Patient 12/14/19 1558     (approximate)  I have reviewed the triage vital signs and the nursing notes.   HISTORY  Chief Complaint Hyperglycemia    HPI Amy Hansen is a 64 y.o. female with past medical history of diabetes, anxiety, and depression who presents to the ED for hyperglycemia and altered mental status.  Patient states she has been "feeling confused" for the past couple of days and her husband agrees that she has not been acting like her usual self.  She reports feeling fatigued and lightheaded, but denies any fevers, cough, chest pain, shortness of breath, or dysuria.  Family was initially concerned for stroke but she has not had any changes in her vision or speech, denies any numbness or weakness.  She was seen by her PCP earlier this morning, noted to be hyperglycemic and sent to the ED for further evaluation.  Patient states she has forgotten to take any of her medications for the past week and her husband was not aware of this.  He states that she has never had similar episodes in the past, denies any history of dementia.        Past Medical History:  Diagnosis Date  . Anemia   . Anxiety   . Chronic kidney disease    UTI, hematuria in urine  . Depression   . Diabetes ([place])   . Diverticulosis   . Frequent headaches   . Interstitial cystitis   . Recurrent UTI   . Restless leg syndrome   . Urinary frequency     Patient Active Problem List   Diagnosis Date Noted  . Major depression, chronic 06/01/2018  . OCD (obsessive compulsive disorder) 06/01/2018  . Chronic heart failure with preserved ejection fraction ([place]) 05/16/2018  . Left leg pain 11/04/2017  . Atypical chest pain 03/05/2017  . GERD (gastroesophageal reflux disease) 02/18/2017  . Iron deficiency anemia 10/18/2016  . Foot swelling 07/17/2016  .  Hypertension 03/21/2016  . Vitamin D deficiency 02/21/2016  . B12 deficiency 02/21/2016  . Incontinent of feces 02/21/2016  . Poorly controlled type 2 diabetes mellitus ([place]) 09/13/2015  . Dysuria 06/14/2015  . Migraines 05/23/2015  . Restless legs syndrome (RLS) 05/23/2015  . Recurrent UTI 04/26/2015  . Atrophic vaginitis 04/26/2015  . Dyspareunia, female 04/26/2015    Past Surgical History:  Procedure Laterality Date  . ABDOMINAL HYSTERECTOMY    . bariatric bypass  2012  . CARPAL TUNNEL RELEASE Right 2003  . CARPAL TUNNEL RELEASE Right    2008  . CHOLECYSTECTOMY  1975  . CYSTOSCOPY W/ RETROGRADES Bilateral 06/06/2015   Procedure: CYSTOSCOPY WITH RETROGRADE PYELOGRAM;  Surgeon: Festus Aloe, MD;  Location: [facility] ORS;  Service: Urology;  Laterality: Bilateral;  . FL INJ LEFT KNEE CT ARTHROGRAM ([facility] HX) Left    1995  . GASTRIC BYPASS  2010  . [place]  2013  . KNEE ARTHROSCOPY Left 1996  . TONSILLECTOMY      Prior to Admission medications   Medication Sig Start Date End Date Taking? Authorizing Provider  ALPRAZolam Duanne Moron) 0.5 MG tablet Take 1 tablet (0.5 mg total) by mouth 4 (four) times daily as needed for anxiety. 09/18/19   Addison Lank, PA-C  buPROPion (WELLBUTRIN XL) 150 MG 24 hr tablet Take 3 tablets (450 mg total) by mouth daily. 09/18/19   Donnal Moat T, PA-C  clotrimazole (GYNE-LOTRIMIN) 1 %  vaginal cream Can apply externally twice daily for 7 days 10/08/19   [provider]  doxycycline (VIBRAMYCIN) 100 MG capsule Take 1 capsule (100 mg total) by mouth 2 (two) times daily. 12/03/19   Bjorn Loser, MD  estradiol (ESTRACE VAGINAL) 0.1 MG/GM vaginal cream Apply 0.75m (pea-sized amount)  just inside the vaginal introitus with a finger-tip on Monday, Wednesday and Friday nights. 04/07/18   MZara CouncilA, PA-C  furosemide (LASIX) 20 MG tablet Take 1 tablet (20 mg total) by mouth daily. 07/29/19   End, CHarrell Gave MD  glipiZIDE (GLUCOTROL) 5 MG  tablet TAKE 1 TABLET BY MOUTH TWICE DAILY WITH MEALS . APPOINTMENT REQUIRED FOR FUTURE REFILLS 07/21/19   LVolney American PA-C  lamoTRIgine (LAMICTAL) 200 MG tablet TAKE 2 TABLETS BY MOUTH AT BEDTIME 05/10/19   HDonnal MoatT, PA-C  metFORMIN (GLUCOPHAGE) 1000 MG tablet TAKE 1 TABLET BY MOUTH TWICE DAILY WITH MEALS 03/26/19   LVolney American PA-C  metoprolol succinate (TOPROL XL) 25 MG 24 hr tablet Take 0.5 tablets (12.5 mg total) by mouth daily. 09/17/19   End, CHarrell Gave MD  ONE TOUCH ULTRA TEST test strip daily. 03/30/16   [provider]  pramipexole (MIRAPEX) 1 MG tablet TAKE 1 TABLET BY MOUTH AT BEDTIME 10/23/19   LVolney American PA-C  promethazine (PHENERGAN) 25 MG tablet Take 1 tablet (25 mg total) by mouth every 8 (eight) hours as needed for nausea or vomiting. 09/11/19   LVolney American PA-C  risperiDONE (RISPERDAL) 3 MG tablet Take 1 tablet (3 mg total) by mouth at bedtime. 09/18/19   HDonnal MoatT, PA-C  sertraline (ZOLOFT) 100 MG tablet Take 1.5 tablets (150 mg total) by mouth daily. 09/18/19   HDonnal MoatT, PA-C  zolpidem (AMBIEN CR) 12.5 MG CR tablet Take 1 tablet (12.5 mg total) by mouth at bedtime as needed. for sleep Patient not taking: Reported on 12/14/2019 09/18/19   HDonnal MoatT, PA-C    Allergies Avelox [moxifloxacin hcl in nacl], Bactrim [sulfamethoxazole-trimethoprim], Ciprofloxacin, Depakote [divalproex sodium], Imitrex [sumatriptan], and Stadol [butorphanol]  Family History  Problem Relation Age of Onset  . Stroke Father   . Colon cancer Mother   . Heart failure Sister   . Bladder Cancer Neg Hx   . Kidney disease Neg Hx   . Prostate cancer Neg Hx   . Kidney cancer Neg Hx     Social History Social History   Tobacco Use  . Smoking status: Former Smoker    Packs/day: 0.50    Types: Cigarettes    Quit date: 04/25/1975    Years since quitting: 44.6  . Smokeless tobacco: Never Used  . Tobacco comment: quit 40 years    Substance Use Topics  . Alcohol use: No    Alcohol/week: 0.0 standard drinks  . Drug use: No    Review of Systems  Constitutional: No fever/chills.  Positive for confusion and generalized weakness. Eyes: No visual changes. ENT: No sore throat. Cardiovascular: Denies chest pain. Respiratory: Denies shortness of breath. Gastrointestinal: No abdominal pain.  No nausea, no vomiting.  No diarrhea.  No constipation. Genitourinary: Negative for dysuria. Musculoskeletal: Negative for back pain. Skin: Negative for rash. Neurological: Negative for headaches, focal weakness or numbness.  ____________________________________________   PHYSICAL EXAM:  VITAL SIGNS: ED Triage Vitals [12/14/19 1214]  Enc Vitals Group     BP 118/77     Pulse Rate 91     Resp 16     Temp 98.2 F (  36.8 C)     Temp Source Oral     SpO2 98 %     Weight 120 lb (54.4 kg)     Height 5' 1"  (1.549 m)     Head Circumference      Peak Flow      Pain Score 0     Pain Loc      Pain Edu?      Excl. in [place]?     Constitutional: Alert and oriented to person, place, and time. Eyes: Conjunctivae are normal.  Pupils equal round and reactive to light bilaterally. Head: Atraumatic. Nose: No congestion/rhinnorhea. Mouth/Throat: Mucous membranes are moist. Neck: Normal ROM Cardiovascular: Normal rate, regular rhythm. Grossly normal heart sounds. Respiratory: Normal respiratory effort.  No retractions. Lungs CTAB. Gastrointestinal: Soft and nontender. No distention. Genitourinary: deferred Musculoskeletal: No lower extremity tenderness nor edema. Neurologic:  Normal speech and language. No gross focal neurologic deficits are appreciated.  5-5 strength in bilateral upper and lower extremities, no pronator drift. Skin:  Skin is warm, dry and intact. No rash noted. Psychiatric: Mood and affect are normal. Speech and behavior are normal.  ____________________________________________   LABS (all labs ordered are  listed, but only abnormal results are displayed)  Labs Reviewed  GLUCOSE, CAPILLARY - Abnormal; Notable for the following components:      Result Value   Glucose-Capillary 587 (*)    All other components within normal limits  BASIC METABOLIC PANEL - Abnormal; Notable for the following components:   Sodium 126 (*)    Chloride 93 (*)    CO2 21 (*)    Glucose, Bld 571 (*)    Calcium 8.7 (*)    All other components within normal limits  CBC - Abnormal; Notable for the following components:   RBC 5.69 (*)    MCV 70.3 (*)    MCH 22.1 (*)    Platelets 469 (*)    All other components within normal limits  URINALYSIS, COMPLETE (UACMP) WITH MICROSCOPIC - Abnormal; Notable for the following components:   Color, Urine STRAW (*)    APPearance CLEAR (*)    Specific Gravity, Urine 1.031 (*)    Glucose, UA >=500 (*)    All other components within normal limits  BLOOD GAS, VENOUS - Abnormal; Notable for the following components:   pCO2, Ven 43 (*)    Acid-base deficit 3.9 (*)    All other components within normal limits  GLUCOSE, CAPILLARY - Abnormal; Notable for the following components:   Glucose-Capillary 334 (*)    All other components within normal limits  GLUCOSE, CAPILLARY - Abnormal; Notable for the following components:   Glucose-Capillary 301 (*)    All other components within normal limits  CBG MONITORING, ED  CBG MONITORING, ED   ____________________________________________  EKG  ED ECG REPORT I, Blake Divine, the attending physician, personally viewed and interpreted this ECG.   Date: 12/14/2019  EKG Time: 18:00  Rate: 81  Rhythm: normal EKG, normal sinus rhythm, unchanged from previous tracings  Axis: Normal  Intervals:none  ST&T Change: None   PROCEDURES  Procedure(s) performed (including Critical Care):  Procedures   ____________________________________________   INITIAL IMPRESSION / ASSESSMENT AND PLAN / ED COURSE       64 year old female with  history of diabetes, anxiety, and depression presents to the ED for confusion, fatigue, and generalized weakness over the past week.  She admits to not taking her medications recently and was noted to be hyperglycemic at her PCPs  office.  Lab work here is significant for hyperglycemia however there is no acidosis to suggest DKA.  She does have pseudohyponatremia, sodium within normal limits when corrected for glucose.  Her glucose is now improved following IV fluid bolus.  She has no focal neurologic deficits on exam and stroke seems unlikely.  We will further assess with CT head.  No evidence of infectious process as she denies any infectious symptoms, UA is unremarkable.  CT head is negative for acute process, patient remains neurologically intact on my exam.  She was offered admission for MRI and further work-up, but she and husband declined.  Husband states that her mental status seems to have improved following IV fluids.  I counseled husband to ensure that she takes her diabetic medications as prescribed and have her closely follow-up with her PCP.  We will also provide with referral to neurology, has been counseled to have patient return to the ED for any new or worsening symptoms.  Patient and family agree with plan.      ____________________________________________   FINAL CLINICAL IMPRESSION(S) / ED DIAGNOSES  Final diagnoses:  Hyperglycemia  Confusion     ED Discharge Orders    None       Note:  This document was prepared using Dragon voice recognition software and may include unintentional dictation errors.   Blake Divine, MD 12/14/19 2130

## 2019-12-14 NOTE — ED Triage Notes (Signed)
Patient presents to the ED for hyperglycemia after seeing her PCP this morning.  Patient states she has felt confused for the past several days.  Patient states, "I worked Friday but I can't remember how I got there or how I got home."  Patient states, "I'm having trouble remembering, I need my husband."  Since patient verbalizes confusion, this RN agreed it would be appropriate for patient's husband to be a visitor in triage and in the waiting room.

## 2019-12-14 NOTE — ED Triage Notes (Signed)
Patient's husband states patient has not been feeling herself for the past few weeks, patient came home disoriented on Friday and yesterday asked her husband multiple times what day it was yesterday which is abnormal.  Husband states patient was not taking her diabetes medication last week.  Patient states this past week she can't remember taking her medicine.

## 2019-12-15 ENCOUNTER — Ambulatory Visit: Payer: Self-pay | Admitting: General Practice

## 2019-12-15 ENCOUNTER — Telehealth: Payer: Self-pay | Admitting: Family Medicine

## 2019-12-15 ENCOUNTER — Telehealth (INDEPENDENT_AMBULATORY_CARE_PROVIDER_SITE_OTHER): Payer: Self-pay | Admitting: Family Medicine

## 2019-12-15 ENCOUNTER — Encounter: Payer: Self-pay | Admitting: Family Medicine

## 2019-12-15 ENCOUNTER — Ambulatory Visit: Payer: Self-pay | Admitting: Pharmacist

## 2019-12-15 VITALS — BP 100/71 | HR 90 | Temp 98.0°F | Wt 120.0 lb

## 2019-12-15 DIAGNOSIS — R634 Abnormal weight loss: Secondary | ICD-10-CM

## 2019-12-15 DIAGNOSIS — G2581 Restless legs syndrome: Secondary | ICD-10-CM

## 2019-12-15 DIAGNOSIS — I5032 Chronic diastolic (congestive) heart failure: Secondary | ICD-10-CM

## 2019-12-15 DIAGNOSIS — E1165 Type 2 diabetes mellitus with hyperglycemia: Secondary | ICD-10-CM

## 2019-12-15 DIAGNOSIS — F329 Major depressive disorder, single episode, unspecified: Secondary | ICD-10-CM

## 2019-12-15 DIAGNOSIS — R410 Disorientation, unspecified: Secondary | ICD-10-CM

## 2019-12-15 DIAGNOSIS — I10 Essential (primary) hypertension: Secondary | ICD-10-CM

## 2019-12-15 LAB — COMPREHENSIVE METABOLIC PANEL
ALT: 17 IU/L (ref 0–32)
AST: 16 IU/L (ref 0–40)
Albumin/Globulin Ratio: 1.8 (ref 1.2–2.2)
Albumin: 4.2 g/dL (ref 3.8–4.8)
Alkaline Phosphatase: 107 IU/L (ref 48–121)
BUN/Creatinine Ratio: 17 (ref 12–28)
BUN: 13 mg/dL (ref 8–27)
Bilirubin Total: <0.2 mg/dL (ref 0.0–1.2)
CO2: 18 mmol/L — ABNORMAL LOW (ref 20–29)
Calcium: 9 mg/dL (ref 8.7–10.3)
Chloride: 94 mmol/L — ABNORMAL LOW (ref 96–106)
Creatinine, Ser: 0.75 mg/dL (ref 0.57–1.00)
GFR calc Af Amer: 99 mL/min/{1.73_m2} (ref >59)
GFR calc non Af Amer: 86 mL/min/{1.73_m2} (ref >59)
Globulin, Total: 2.3 g/dL (ref 1.5–4.5)
Glucose: 626 mg/dL (ref 65–99)
Potassium: 5 mmol/L (ref 3.5–5.2)
Sodium: 127 mmol/L — ABNORMAL LOW (ref 134–144)
Total Protein: 6.5 g/dL (ref 6.0–8.5)

## 2019-12-15 MED ORDER — PEN NEEDLES 32G X 4 MM MISC: 1.0000 [IU] | Freq: Every day | 1 refills | Status: DC

## 2019-12-15 NOTE — Chronic Care Management (AMB) (Signed)
Chronic Care Management   Note  12/15/2019 Name: Amy Hansen MRN: [id number] DOB: [birth year]   Subjective:  Amy Hansen is a 64 y.o. year old female who is a primary care patient of Volney American, [place]. The CCM team was consulted for assistance with chronic disease management and care coordination needs.    Contacted patient for urgent medication access needs.  Review of patient status, including review of consultants reports, laboratory and other test data, was performed as part of comprehensive evaluation and provision of chronic care management services.   SDOH (Social Determinants of Health) assessments and interventions performed:  yes  Objective:  Lab Results  Component Value Date   CREATININE 0.75 12/14/2019   CREATININE 0.87 12/14/2019   CREATININE 0.80 07/01/2019    Lab Results  Component Value Date   HGBA1C >14.0 (H) 12/14/2019       Component Value Date/Time   CHOL 139 07/17/2016 1408   CHOL 148 01/10/2012 0717   TRIG 99 07/17/2016 1408   TRIG 110 01/10/2012 0717   HDL 47 01/10/2012 0717   VLDL 20 07/17/2016 1408   VLDL 22 01/10/2012 0717   LDLCALC 79 01/10/2012 0717    Clinical ASCVD: No  The ASCVD Risk score Mikey Bussing DC Jr., et al., 2013) failed to calculate for the following reasons:   Cannot find a previous HDL lab   Cannot find a previous total cholesterol lab    BP Readings from Last 3 Encounters:  12/15/19 100/71  12/14/19 111/74  12/14/19 102/69    Allergies  Allergen Reactions  . Avelox [Moxifloxacin Hcl In Nacl] Anaphylaxis  . Bactrim [Sulfamethoxazole-Trimethoprim] Anaphylaxis  . Ciprofloxacin Other (See Comments)    Pt states she was told never to take this as it is in the same family as Avelox.   . Depakote [Divalproex Sodium]   . Imitrex [Sumatriptan] Other (See Comments)    Neck and shoulder pain  . Stadol [Butorphanol] Rash    Medications Reviewed Today    Reviewed by De Hollingshead, [facility] (Pharmacist) on  12/15/19 at [place] List Status: <None>  Medication Order Taking? Sig Documenting Provider Last Dose Status Informant  ALPRAZolam (XANAX) 0.5 MG tablet [id number] Yes Take 1 tablet (0.5 mg total) by mouth 4 (four) times daily as needed for anxiety. Addison Lank, PA-C Taking Active            Med Note De Hollingshead   Tue Dec 15, 2019  3:57 PM) 2-3 times daily   buPROPion (WELLBUTRIN XL) 150 MG 24 hr tablet [id number] Yes Take 3 tablets (450 mg total) by mouth daily. Donnal Moat T, PA-C Taking Active   clotrimazole (GYNE-LOTRIMIN) 1 % vaginal cream [id number] Yes Can apply externally twice daily for 7 days [provider] Taking Active            Med Note Sharlyne Pacas Dec 14, 2019 10:32 AM) As needed        Discontinued 12/15/19 1605 (Completed Course)   estradiol (ESTRACE VAGINAL) 0.1 MG/GM vaginal cream [id number] Yes Apply 0.90m (pea-sized amount)  just inside the vaginal introitus with a finger-tip on Monday, Wednesday and Friday nights. MZara CouncilA, PA-C Taking Active   furosemide (LASIX) 20 MG tablet [id number] Take 1 tablet (20 mg total) by mouth daily. End, CHarrell Gave MD Taking Active   glipiZIDE (GLUCOTROL) 5 MG tablet [id number] TAKE 1 TABLET BY MOUTH TWICE DAILY WITH MEALS . APPOINTMENT REQUIRED FOR FUTURE  REFILLS Volney American, PA-C Taking Active   insulin glargine, 2 Unit Dial, (TOUJEO MAX SOLOSTAR) 300 UNIT/ML Solostar Pen [id number] Yes Inject 10 Units into the skin. [provider] Taking Active   Insulin Pen Needle (PEN NEEDLES) 32G X 4 MM MISC [id number] Yes 1 Units by Does not apply route at bedtime. Volney American, [place] Taking Active   lamoTRIgine (LAMICTAL) 200 MG tablet [id number] Yes TAKE 2 TABLETS BY MOUTH AT BEDTIME Addison Lank, PA-C Taking Active   metFORMIN (GLUCOPHAGE) 1000 MG tablet [id number] Yes TAKE 1 TABLET BY MOUTH TWICE DAILY WITH MEALS Volney American, PA-C Taking Active   metoprolol succinate  (TOPROL XL) 25 MG 24 hr tablet [id number] No Take 0.5 tablets (12.5 mg total) by mouth daily.  Patient not taking: Reported on 12/15/2019   Nelva Bush, MD Not Taking Active   ONE TOUCH ULTRA TEST test strip [id number] Yes daily. [provider] Taking Active            Med Note Shelia Media, Gwendolyn Fill May 30, 2016  1:19 PM) Received from: External Pharmacy  pramipexole (MIRAPEX) 1 MG tablet [id number] Yes TAKE 1 TABLET BY MOUTH AT BEDTIME Volney American, PA-C Taking Active            Med Note ([place] Dec 15, 2019  4:00 PM) PRN  promethazine (PHENERGAN) 25 MG tablet [id number]  Take 1 tablet (25 mg total) by mouth every 8 (eight) hours as needed for nausea or vomiting. Volney American, [place]  Active   risperiDONE (RISPERDAL) 3 MG tablet [id number] Yes Take 1 tablet (3 mg total) by mouth at bedtime. Addison Lank, PA-C Taking Active   sertraline (ZOLOFT) 100 MG tablet [id number] Yes Take 1.5 tablets (150 mg total) by mouth daily. Addison Lank, PA-C Taking Active   zolpidem (AMBIEN CR) 12.5 MG CR tablet [id number] No Take 1 tablet (12.5 mg total) by mouth at bedtime as needed. for sleep  Patient not taking: Reported on 12/14/2019   Alen Blew Not Taking Active            Assessment:   Goals Addressed            This Visit's Progress     Patient Stated   . PharmD "I can't afford my medications" (pt-stated)       CARE PLAN ENTRY (see longtitudinal plan of care for additional care plan information)  Current Barriers:  . Polypharmacy; complex patient with multiple comorbidities including HFpEF, T2DM, depression, OCD, RLS . Financial concerns - patient is uninsured, started on insulin therapy today. Referred for affordability concerns . Most recent eGFR: ~90 o T2DM: A1c 14%, ED visit yesterday for hyperglycemia and resultant mental status changes. Metformin 1000 mg BID (though prescription has not been refilled since August),  glipizide 5 mg BID, and started on Toujeo sample today. To start at 10 units QPM. Patient picked up sample this afternoon and will start tonight. Notes that she worries her glucometer is not accurate o Depression: Managed by Donnal Moat, PA, psychiatry. sertraline 150 mg daily, bupropion XL 450 mg daily, lamotrigine 400 mg QPM, risperidone 3 mg QPM, alprazolam 0.5 mg up to 4 times daily PRN, using 2-3 times daily  o HFpEF: furosemide 20 mg daily, prescribed metoprolol succinate 12.5 mg daily per Dr. Saunders Revel after Zio placement, but patient reports that she didn't pick it up or ever start it o RLS: pramipexole 1  mg QPM  Pharmacist Clinical Goal(s):  [name] Over the next 90 days, patient will work with PharmD and provider towards optimized medication management  Interventions: . Comprehensive medication review performed; medication list updated in electronic medical record . Inter-disciplinary care team collaboration (see longitudinal plan of care) . Discussed [facility]. Patient amenable to having prescriptions sent there to hopefully receive for free. Will collaborate w/ PCP to send metformin, glipizide, Toujeo, pramipexole, pen needles, and glucometer prescriptions (they will provide glucometers and testing supplies for patients on insulin). Will collaborate w/ PharmD to outreach patient to discuss eligibility . Will outreach Donnal Moat to send psych scripts to [facility]. [facility] does not supply controlled substances, patient is aware she will need to continue to fill alprazolam at [facility] . Reviewed that given HFpEF and previous reports of palpitations, starting metoprolol and continuing f/u with Dr. Saunders Revel would be in her best interest. Will continue to discuss moving forward, and engage financial resource support as appropriate.   Patient Self Care Activities:  . Patient will take medications as prescribed  Please see past updates related to this goal by clicking on the "Past Updates" button in  the selected goal         Plan: - Will collaborate w/ interdisciplinary team as above  Catie Darnelle Maffucci, PharmD, [place] [phone]

## 2019-12-15 NOTE — Telephone Encounter (Signed)
Just spoke to patient on husband's number - [phone]. Can you call that to schedule? She said her phone is acting up. Thanks!

## 2019-12-15 NOTE — Patient Instructions (Signed)
Visit Information  Goals Addressed            This Visit's Progress    RNCM: Pt- "Today my blood sugar was 465" (pt-stated)       CARE PLAN ENTRY (see longtitudinal plan of care for additional care plan information)  Objective:  Lab Results  Component Value Date   HGBA1C >14.0 (H) 12/14/2019    Lab Results  Component Value Date   CREATININE 0.75 12/14/2019   CREATININE 0.87 12/14/2019   CREATININE 0.80 07/01/2019     No results found for: EGFR  Current Barriers:   Knowledge Deficits related to basic Diabetes pathophysiology and self care/management  Knowledge Deficits related to medications used for management of diabetes  Difficulty obtaining or cannot afford medications  Does not have glucometer to monitor blood sugar- has access to a meter to take blood sugar readings  Financial Constraints  Cognitive Deficits  Limited Social Support  Case Manager Clinical Goal(s):  Over the next 120 days, patient will demonstrate improved adherence to prescribed treatment plan for diabetes self care/management as evidenced by:   daily monitoring and recording of CBG   adherence to ADA/ carb modified diet  adherence to prescribed medication regimen  Interventions:   Provided education to patient about basic DM disease process  Reviewed medications with patient and discussed importance of medication adherence  Discussed plans with patient for ongoing care management follow up and provided patient with direct contact information for care management team  Provided patient with written educational materials related to hypo and hyperglycemia and importance of correct treatment. Sending information by my Chart and the EMMI system.   Reviewed scheduled/upcoming provider appointments including: Was in ER on 12/14/2019 for hyperglycemia. Telephone visit today with pcp. Next pcp visit 12-31-2019 at 10:45.   Reviewed with the patient the effects uncontrolled diabetes can have on  the eyes, kidneys, and other organ systems.   Advised patient, providing education and rationale, to check cbg 2 times or more daily and record, calling pcp for findings outside established parameters.    Provided a calendar/record book for recording blood sugars and samples with coupons for Glucerna for the patient at the front desk. She will come by and pick up the book and Glucerna samples.   Referral for LCSW due to depression and help with coping mechanisms.   Patient Self Care Activities:   UNABLE to independently manage diabetes  Self administers oral medications as prescribed  Self administers insulin as prescribed  Checks blood sugars as prescribed and utilize hyper and hypoglycemia protocol as needed  Adheres to prescribed ADA/carb modified  Initial goal documentation      RNCM: Pt-"I have a way of checking my blood pressure" "I do not weigh daily" (pt-stated)       CARE PLAN ENTRY (see longtitudinal plan of care for additional care plan information)  Current Barriers:   Chronic Disease Management support, education, and care coordination needs related to CHF, HTN, and Depression  Clinical Goal(s) related to CHF, HTN, and Depression:  Over the next 120 days, patient will:   Work with the care management team to address educational, disease management, and care coordination needs   Begin or continue self health monitoring activities as directed today Measure and record blood pressure 2 times per week, Measure and record weight daily, and adhere to a heart healthy/ADA diet  Call provider office for new or worsened signs and symptoms Blood pressure findings outside established parameters, Weight outside established parameters, and  New or worsened symptom related to depression or other chronic health conditions  Call care management team with questions or concerns  Verbalize basic understanding of patient centered plan of care established today  Interventions related  to CHF, HTN, and Depression:   Evaluation of current treatment plans and patient's adherence to plan as established by provider  Assessed patient understanding of disease states.  The patient verbalized she understands and she doesn't know why she let things get so out of control. Has not taken medications in 2 weeks. Education and support given.   Assessed patient's education and care coordination needs.  Education on the benefits of effective management of chronic conditions. The importance of daily weights even with preserved heart failure. The patient does have a way of checking her blood pressure and will start doing this on a consistent basis.   Provided disease specific education to patient. Educational information sent to the patient by my Chart and EMMI on chronic conditions and effective management.   Collaborated with appropriate clinical care team members regarding patient needs.  Pharm D spoke to the patient today. A referral made to the LCSW to assist with depression. The patient denies any suicidal ideation/harm of self or others. The RNCM was concerned since she has been off of her medications of  changes in mood/mental status.   Patient Self Care Activities related to CHF, HTN, and Depression:   Patient is unable to independently self-manage chronic health conditions  Initial goal documentation        Ms. Kriesel was given information about Care Management services today including:  1. Care Management services include personalized support from designated clinical staff supervised by her physician, including individualized plan of care and coordination with other care providers 2. 24/7 contact phone numbers for assistance for urgent and routine care needs. 3. The patient may stop CCM services at any time (effective at the end of the month) by phone call to the office staff.  Patient agreed to services and verbal consent obtained.   Patient verbalizes understanding of  instructions provided today.   The care management team will reach out to the patient again over the next 30 to 60 days.   Noreene Larsson RN, MSN, [facility] Mobile: [phone]

## 2019-12-15 NOTE — Chronic Care Management (AMB) (Signed)
Care Management   Initial Visit Note  12/15/2019 Name: Amy Hansen MRN: [id number] DOB: 11-25-1955  Subjective: "My sugar today was 465"  Objective:  BP Readings from Last 3 Encounters:  12/15/19 100/71  12/14/19 111/74  12/14/19 102/69   Lab Results  Component Value Date   HGBA1C >14.0 (H) 12/14/2019    Assessment: Amy Hansen is a 64 y.o. year old female who sees Volney American, [place] for primary care. The care management team was consulted for assistance with care management and care coordination needs related to Disease Management Educational Needs Care Coordination Medication Management and Education Psychosocial Support.   Review of patient status, including review of consultants reports, relevant laboratory and other test results, and collaboration with appropriate care team members and the patient's provider was performed as part of comprehensive patient evaluation and provision of care management services.    SDOH (Social Determinants of Health) assessments performed: Yes See Care Plan activities for detailed interventions related to SDOH)  SDOH Interventions     Most Recent Value  SDOH Interventions  SDOH Interventions for the Following Domains  Physical Activity  Physical Activity Interventions  Other (Comments) [Does not do structured activity.]  Depression Interventions/Treatment   -- [The patient has a history of depression and on medications.  Has been off of medications for 2 weeks.]       Outpatient Encounter Medications as of 12/15/2019  Medication Sig Note  . ALPRAZolam (XANAX) 0.5 MG tablet Take 1 tablet (0.5 mg total) by mouth 4 (four) times daily as needed for anxiety. 12/15/2019: 2-3 times daily   . buPROPion (WELLBUTRIN XL) 150 MG 24 hr tablet Take 3 tablets (450 mg total) by mouth daily.   . clotrimazole (GYNE-LOTRIMIN) 1 % vaginal cream Can apply externally twice daily for 7 days 12/14/2019: As needed  . estradiol (ESTRACE VAGINAL) 0.1  MG/GM vaginal cream Apply 0.21m (pea-sized amount)  just inside the vaginal introitus with a finger-tip on Monday, Wednesday and Friday nights.   . furosemide (LASIX) 20 MG tablet Take 1 tablet (20 mg total) by mouth daily.   .Marland KitchenglipiZIDE (GLUCOTROL) 5 MG tablet TAKE 1 TABLET BY MOUTH TWICE DAILY WITH MEALS . APPOINTMENT REQUIRED FOR FUTURE REFILLS   . insulin glargine, 2 Unit Dial, (TOUJEO MAX SOLOSTAR) 300 UNIT/ML Solostar Pen Inject 10 Units into the skin.   . Insulin Pen Needle (PEN NEEDLES) 32G X 4 MM MISC 1 Units by Does not apply route at bedtime.   . lamoTRIgine (LAMICTAL) 200 MG tablet TAKE 2 TABLETS BY MOUTH AT BEDTIME   . metFORMIN (GLUCOPHAGE) 1000 MG tablet TAKE 1 TABLET BY MOUTH TWICE DAILY WITH MEALS   . metoprolol succinate (TOPROL XL) 25 MG 24 hr tablet Take 0.5 tablets (12.5 mg total) by mouth daily. (Patient not taking: Reported on 12/15/2019)   . ONE TOUCH ULTRA TEST test strip daily. 05/30/2016: Received from: External Pharmacy  . pramipexole (MIRAPEX) 1 MG tablet TAKE 1 TABLET BY MOUTH AT BEDTIME 12/15/2019: PRN  . promethazine (PHENERGAN) 25 MG tablet Take 1 tablet (25 mg total) by mouth every 8 (eight) hours as needed for nausea or vomiting.   . risperiDONE (RISPERDAL) 3 MG tablet Take 1 tablet (3 mg total) by mouth at bedtime.   . sertraline (ZOLOFT) 100 MG tablet Take 1.5 tablets (150 mg total) by mouth daily.   .Marland Kitchenzolpidem (AMBIEN CR) 12.5 MG CR tablet Take 1 tablet (12.5 mg total) by mouth at bedtime as needed. for sleep (  Patient not taking: Reported on 12/14/2019)    No facility-administered encounter medications on file as of 12/15/2019.    Goals Addressed            This Visit's Progress   . RNCM: Pt- "Today my blood sugar was 465" (pt-stated)       CARE PLAN ENTRY (see longtitudinal plan of care for additional care plan information)  Objective:  Lab Results  Component Value Date   HGBA1C >14.0 (H) 12/14/2019 .   Lab Results  Component Value Date    CREATININE 0.75 12/14/2019   CREATININE 0.87 12/14/2019   CREATININE 0.80 07/01/2019 .   [name] No results found for: EGFR  Current Barriers:  [name] Knowledge Deficits related to basic Diabetes pathophysiology and self care/management . Knowledge Deficits related to medications used for management of diabetes . Difficulty obtaining or cannot afford medications . Does not have glucometer to monitor blood sugar- has access to a meter to take blood sugar readings . Film/video editor . Cognitive Deficits . Limited Social Support  Case Manager Clinical Goal(s):  Over the next 120 days, patient will demonstrate improved adherence to prescribed treatment plan for diabetes self care/management as evidenced by:  . daily monitoring and recording of CBG  . adherence to ADA/ carb modified diet . adherence to prescribed medication regimen  Interventions:  . Provided education to patient about basic DM disease process . Reviewed medications with patient and discussed importance of medication adherence . Discussed plans with patient for ongoing care management follow up and provided patient with direct contact information for care management team . Provided patient with written educational materials related to hypo and hyperglycemia and importance of correct treatment. Sending information by my Chart and the EMMI system.  . Reviewed scheduled/upcoming provider appointments including: Was in ER on 12/14/2019 for hyperglycemia. Telephone visit today with pcp. Next pcp visit 12-31-2019 at 10:45.  . Reviewed with the patient the effects uncontrolled diabetes can have on the eyes, kidneys, and other organ systems.  . Advised patient, providing education and rationale, to check cbg 2 times or more daily and record, calling pcp for findings outside established parameters.   . Provided a calendar/record book for recording blood sugars and samples with coupons for Glucerna for the patient at the front desk. She will  come by and pick up the book and Glucerna samples.  . Referral for LCSW due to depression and help with coping mechanisms.   Patient Self Care Activities:  . UNABLE to independently manage diabetes . Self administers oral medications as prescribed . Self administers insulin as prescribed . Checks blood sugars as prescribed and utilize hyper and hypoglycemia protocol as needed . Adheres to prescribed ADA/carb modified  Initial goal documentation     . RNCM: Pt-"I have a way of checking my blood pressure" "I do not weigh daily" (pt-stated)       CARE PLAN ENTRY (see longtitudinal plan of care for additional care plan information)  Current Barriers:  . Chronic Disease Management support, education, and care coordination needs related to CHF, HTN, and Depression  Clinical Goal(s) related to CHF, HTN, and Depression:  Over the next 120 days, patient will:  . Work with the care management team to address educational, disease management, and care coordination needs  . Begin or continue self health monitoring activities as directed today Measure and record blood pressure 2 times per week, Measure and record weight daily, and adhere to a heart healthy/ADA diet . Call provider  office for new or worsened signs and symptoms Blood pressure findings outside established parameters, Weight outside established parameters, and New or worsened symptom related to depression or other chronic health conditions . Call care management team with questions or concerns . Verbalize basic understanding of patient centered plan of care established today  Interventions related to CHF, HTN, and Depression:  . Evaluation of current treatment plans and patient's adherence to plan as established by provider . Assessed patient understanding of disease states.  The patient verbalized she understands and she doesn't know why she let things get so out of control. Has not taken medications in 2 weeks. Education and support  given.  . Assessed patient's education and care coordination needs.  Education on the benefits of effective management of chronic conditions. The importance of daily weights even with preserved heart failure. The patient does have a way of checking her blood pressure and will start doing this on a consistent basis.  . Provided disease specific education to patient. Educational information sent to the patient by my Chart and EMMI on chronic conditions and effective management.  Nash Dimmer with appropriate clinical care team members regarding patient needs.  Pharm D spoke to the patient today. A referral made to the LCSW to assist with depression. The patient denies any suicidal ideation/harm of self or others. The RNCM was concerned since she has been off of her medications of  changes in mood/mental status.   Patient Self Care Activities related to CHF, HTN, and Depression:  . Patient is unable to independently self-manage chronic health conditions  Initial goal documentation         Follow up plan:  The care management team will reach out to the patient again over the next 30 to 60 days.   Ms. Boesen was given information about Care Management services today including:  1. Care Management services include personalized support from designated clinical staff supervised by a physician, including individualized plan of care and coordination with other care providers 2. 24/7 contact phone numbers for assistance for urgent and routine care needs. 3. The patient may stop Care Management services at any time (effective at the end of the month) by phone call to the office staff.  Patient agreed to services and verbal consent obtained.  Noreene Larsson RN, MSN, [facility] Mobile: [phone]

## 2019-12-15 NOTE — Telephone Encounter (Signed)
lvm to make this apt.

## 2019-12-15 NOTE — Assessment & Plan Note (Signed)
>>  ASSESSMENT AND PLAN FOR DIABETES MELLITUS TYPE 2 IN NONOBESE (HCC) WRITTEN ON 12/15/2019 12:05 PM BY LANE, RACHEL ELIZABETH, PA-C  Poor glycemic control on oral agents, with recent inconsistent use. Given the extent of her hyperglycemia do recommend starting low dose insulin. Will give toujeo sample from office to start, and try 10 units initially at bedtime. Will check in often about how BSs have been after starting, discussed currently that the goal for fasting sugars in the AM will be around 150-180 to not bring sugars down too quickly given they've recently been in 300-500 range. Also discussed importance of consistency with PO intake. Urgent CCM referral placed to consult on case with DM education and med adherence/coverage given pt self pay status. Pt's husband states they have access to a family member's glucometer so they can get reliable BS readings. Questions answered, return precautions reviewed. Will recheck in 2 weeks in person and be in touch by phone in meantime for check ins

## 2019-12-15 NOTE — Assessment & Plan Note (Signed)
Poor glycemic control on oral agents, with recent inconsistent use. Given the extent of her hyperglycemia do recommend starting low dose insulin. Will give toujeo sample from office to start, and try 10 units initially at bedtime. Will check in often about how BSs have been after starting, discussed currently that the goal for fasting sugars in the AM will be around 150-180 to not bring sugars down too quickly given they've recently been in 300-500 range. Also discussed importance of consistency with PO intake. Urgent CCM referral placed to consult on case with DM education and med adherence/coverage given pt self pay status. Pt's husband states they have access to a family member's glucometer so they can get reliable BS readings. Questions answered, return precautions reviewed. Will recheck in 2 weeks in person and be in touch by phone in meantime for check ins

## 2019-12-15 NOTE — Patient Instructions (Signed)
Visit Information  Goals Addressed            This Visit's Progress     Patient Stated   . PharmD "I can't afford my medications" (pt-stated)       CARE PLAN ENTRY (see longtitudinal plan of care for additional care plan information)  Current Barriers:  . Polypharmacy; complex patient with multiple comorbidities including HFpEF, T2DM, depression, OCD, RLS . Financial concerns - patient is uninsured, started on insulin therapy today. Referred for affordability concerns . Most recent eGFR: ~90 o T2DM: A1c 14%, ED visit yesterday for hyperglycemia and resultant mental status changes. Metformin 1000 mg BID (though prescription has not been refilled since August), glipizide 5 mg BID, and started on Toujeo sample today. To start at 10 units QPM. Patient picked up sample this afternoon and will start tonight. Notes that she worries her glucometer is not accurate o Depression: Managed by Donnal Moat, PA, psychiatry. sertraline 150 mg daily, bupropion XL 450 mg daily, lamotrigine 400 mg QPM, risperidone 3 mg QPM, alprazolam 0.5 mg up to 4 times daily PRN, using 2-3 times daily  o HFpEF: furosemide 20 mg daily, prescribed metoprolol succinate 12.5 mg daily per Dr. Saunders Revel after Zio placement, but patient reports that she didn't pick it up or ever start it o RLS: pramipexole 1 mg QPM  Pharmacist Clinical Goal(s):  [name] Over the next 90 days, patient will work with PharmD and provider towards optimized medication management  Interventions: . Comprehensive medication review performed; medication list updated in electronic medical record . Inter-disciplinary care team collaboration (see longitudinal plan of care) . D[facility]ed [facility]. Patient amenable to having prescriptions sent there to hopefully receive for free. Will collaborate w/ PCP to send metformin, glipizide, Toujeo, pramipexole, pen needles, and glucometer prescriptions (they will provide glucometers and testing supplies for  patients on insulin). Will collaborate w/ PharmD to outreach patient to discuss eligibility . Will outreach Donnal Moat to send psych scripts to [facility]. [facility] does not supply controlled substances, patient is aware she will need to continue to fill alprazolam at [facility] . Reviewed that given HFpEF and previous reports of palpitations, starting metoprolol and continuing f/u with Dr. Saunders Revel would be in her best interest. Will continue to discuss moving forward, and engage financial resource support as appropriate.   Patient Self Care Activities:  . Patient will take medications as prescribed  Please see past updates related to this goal by clicking on the "Past Updates" button in the selected goal         Patient verbalizes understanding of instructions provided today.  Plan: - Will collaborate w/ interdisciplinary team as above  Catie Darnelle Maffucci, PharmD, [place] [phone]

## 2019-12-15 NOTE — Progress Notes (Signed)
BP 100/71   Pulse 90   Temp 98 F (36.7 C)   Wt 120 lb (54.4 kg)   LMP  (LMP Unknown)   BMI 22.67 kg/m    Subjective:    Patient ID: Amy Hansen, female    DOB: January 21, 1956, 64 y.o.   MRN: [id number]  HPI: Amy Hansen is a 64 y.o. female  Chief Complaint  Patient presents with  . er follow up    Diabetes    . This visit was completed via MyChart due to the restrictions of the COVID-19 pandemic. All issues as above were discussed and addressed. Physical exam was done as above through visual confirmation on MyChart. If it was felt that the patient should be evaluated in the office, they were directed there. The patient verbally consented to this visit. . Location of the patient: home . Location of the provider: home . Those involved with this call:  . Provider: Merrie Roof, PA-C . CMA: Tiffany Reel, CMA . Front Desk/Registration: Jill Side  . Time spent on call: 25 minutes with patient face to face via video conference. More than 50% of this time was spent in counseling and coordination of care. 5 minutes total spent in review of patient's record and preparation of their chart. I verified patient identity using two factors (patient name and date of birth). Patient consents verbally to being seen via telemedicine visit today.   Presenting today for ER f/u for severe hyperglycemia and altered mental status. On presentation to ER sugars were 587 and pt was significantly dehydrated. She was given IV fluids which helped bring sugars down to 301 and helped some with her mental status. CT head was overall benign but did show evidence of some small vessel disease. BS this morning was 465, but per husband they don't believe the machine is functioning properly as it read his personal sugar in the 400s and he is not diabetic. They plan to obtain a new machine from a family member who never used it later today so they have reliable equipment at home. Overall pt feeling slightly better  but still very groggy and without appetite. She did admit in ER to skipping her medications the past week but took them as prescribed this morning.   Relevant past medical, surgical, family and social history reviewed and updated as indicated. Interim medical history since our last visit reviewed. Allergies and medications reviewed and updated.  Review of Systems  Per HPI unless specifically indicated above     Objective:    BP 100/71   Pulse 90   Temp 98 F (36.7 C)   Wt 120 lb (54.4 kg)   LMP  (LMP Unknown)   BMI 22.67 kg/m   Wt Readings from Last 3 Encounters:  12/15/19 120 lb (54.4 kg)  12/14/19 120 lb (54.4 kg)  12/14/19 121 lb (54.9 kg)    Physical Exam Vitals and nursing note reviewed.  Constitutional:      General: She is not in acute distress.    Appearance: Normal appearance.  HENT:     Head: Atraumatic.     Right Ear: External ear normal.     Left Ear: External ear normal.     Nose: Nose normal. No congestion.     Mouth/Throat:     Mouth: Mucous membranes are moist.     Pharynx: Oropharynx is clear. No posterior oropharyngeal erythema.  Eyes:     Extraocular Movements: Extraocular movements intact.  Conjunctiva/sclera: Conjunctivae normal.  Cardiovascular:     Comments: Unable to assess via virtual visit Pulmonary:     Effort: Pulmonary effort is normal. No respiratory distress.  Musculoskeletal:        General: Normal range of motion.     Cervical back: Normal range of motion.  Skin:    General: Skin is dry.     Findings: No erythema.  Neurological:     Mental Status: She is alert.     Comments: Mental status not quite back to baseline but improved from yesterday, alert and responsive, answering questions simply but appropriately  Psychiatric:        Mood and Affect: Mood normal.        Thought Content: Thought content normal.        Judgment: Judgment normal.     Results for orders placed or performed during the hospital encounter of  12/14/19  Glucose, capillary  Result Value Ref Range   Glucose-Capillary 587 (HH) 70 - 99 mg/dL   Comment 1 Notify RN    Comment 2 Repeat Test   Basic metabolic panel  Result Value Ref Range   Sodium 126 (L) 135 - 145 mmol/L   Potassium 4.4 3.5 - 5.1 mmol/L   Chloride 93 (L) 98 - 111 mmol/L   CO2 21 (L) 22 - 32 mmol/L   Glucose, Bld 571 (HH) 70 - 99 mg/dL   BUN 14 8 - 23 mg/dL   Creatinine, Ser 0.87 0.44 - 1.00 mg/dL   Calcium 8.7 (L) 8.9 - 10.3 mg/dL   GFR calc non Af Amer >60 >60 mL/min   GFR calc Af Amer >60 >60 mL/min   Anion gap 12 5 - 15  CBC  Result Value Ref Range   WBC 6.8 4.0 - 10.5 K/uL   RBC 5.69 (H) 3.87 - 5.11 MIL/uL   Hemoglobin 12.6 12.0 - 15.0 g/dL   HCT 40.0 36.0 - 46.0 %   MCV 70.3 (L) 80.0 - 100.0 fL   MCH 22.1 (L) 26.0 - 34.0 pg   MCHC 31.5 30.0 - 36.0 g/dL   RDW 15.3 11.5 - 15.5 %   Platelets 469 (H) 150 - 400 K/uL   nRBC 0.0 0.0 - 0.2 %  Urinalysis, Complete w Microscopic  Result Value Ref Range   Color, Urine STRAW (A) YELLOW   APPearance CLEAR (A) CLEAR   Specific Gravity, Urine 1.031 (H) 1.005 - 1.030   pH 6.0 5.0 - 8.0   Glucose, UA >=500 (A) NEGATIVE mg/dL   Hgb urine dipstick NEGATIVE NEGATIVE   Bilirubin Urine NEGATIVE NEGATIVE   Ketones, ur NEGATIVE NEGATIVE mg/dL   Protein, ur NEGATIVE NEGATIVE mg/dL   Nitrite NEGATIVE NEGATIVE   Leukocytes,Ua NEGATIVE NEGATIVE   WBC, UA 6-10 0 - 5 WBC/hpf   Bacteria, UA NONE SEEN NONE SEEN   Squamous Epithelial / LPF 0-5 0 - 5  Blood gas, venous  Result Value Ref Range   pH, Ven 7.32 7.250 - 7.430   pCO2, Ven 43 (L) 44.0 - 60.0 mmHg   pO2, Ven 38.0 32.0 - 45.0 mmHg   Bicarbonate 22.2 20.0 - 28.0 mmol/L   Acid-base deficit 3.9 (H) 0.0 - 2.0 mmol/L   O2 Saturation 66.7 %   Patient temperature 37.0    Collection site VEIN    Sample type VEIN   Glucose, capillary  Result Value Ref Range   Glucose-Capillary 334 (H) 70 - 99 mg/dL  Glucose, capillary  Result Value Ref  Range   Glucose-Capillary  301 (H) 70 - 99 mg/dL      Assessment & Plan:   Problem List Items Addressed This Visit      Endocrine   Poorly controlled type 2 diabetes mellitus ([place])    Poor glycemic control on oral agents, with recent inconsistent use. Given the extent of her hyperglycemia do recommend starting low dose insulin. Will give toujeo sample from office to start, and try 10 units initially at bedtime. Will check in often about how BSs have been after starting, discussed currently that the goal for fasting sugars in the AM will be around 150-180 to not bring sugars down too quickly given they've recently been in 300-500 range. Also discussed importance of consistency with PO intake. Urgent CCM referral placed to consult on case with DM education and med adherence/coverage given pt self pay status. Pt's husband states they have access to a family member's glucometer so they can get reliable BS readings. Questions answered, return precautions reviewed. Will recheck in 2 weeks in person and be in touch by phone in meantime for check ins      Relevant Orders   Referral to Chronic Care Management Services    Other Visit Diagnoses    Disorientation    -  Primary   Improved from yesterday with IV re-hydration in ER but not back to baseline. Continue to monitor and work on glycemic control. Neuro referral pending   Relevant Orders   Referral to Chronic Care Management Services   Weight loss       Increase PO intake, improve glycemic control. May need GI referral if not re-gaining as these improve   Relevant Orders   Referral to Chronic Care Management Services       Follow up plan: Return in about 2 weeks (around 12/29/2019) for DM, weight f/u.

## 2019-12-15 NOTE — Telephone Encounter (Signed)
-----   Message from Volney American, [place] sent at 12/15/2019 12:11 PM EDT ----- 2 week DM f/u in person please

## 2019-12-16 ENCOUNTER — Other Ambulatory Visit: Payer: Self-pay | Admitting: Physician Assistant

## 2019-12-16 MED ORDER — RISPERIDONE 3 MG PO TABS: 3.0000 mg | ORAL_TABLET | Freq: Every day | ORAL | 2 refills | Status: DC

## 2019-12-16 MED ORDER — BUPROPION HCL ER (XL) 150 MG PO TB24: 450.0000 mg | ORAL_TABLET | Freq: Every day | ORAL | 2 refills | Status: DC

## 2019-12-16 MED ORDER — SERTRALINE HCL 100 MG PO TABS: 150.0000 mg | ORAL_TABLET | Freq: Every day | ORAL | 2 refills | Status: DC

## 2019-12-16 MED ORDER — LAMOTRIGINE 200 MG PO TABS: 200.0000 mg | ORAL_TABLET | Freq: Two times a day (BID) | ORAL | 2 refills | Status: DC

## 2019-12-16 NOTE — Telephone Encounter (Signed)
Apt 09/18/2019 due back 6 weeks

## 2019-12-17 ENCOUNTER — Telehealth: Payer: Self-pay | Admitting: Family Medicine

## 2019-12-17 ENCOUNTER — Encounter: Payer: Self-pay | Admitting: Family Medicine

## 2019-12-17 LAB — MICROSCOPIC EXAMINATION: RBC, Urine: NONE SEEN /hpf (ref 0–2)

## 2019-12-17 LAB — UA/M W/RFLX CULTURE, ROUTINE
Bilirubin, UA: NEGATIVE
Ketones, UA: NEGATIVE
Leukocytes,UA: NEGATIVE
Nitrite, UA: POSITIVE — AB
Protein,UA: NEGATIVE
RBC, UA: NEGATIVE
Specific Gravity, UA: <1.005 — ABNORMAL LOW (ref 1.005–1.030)
Urobilinogen, Ur: 0.2 mg/dL (ref 0.2–1.0)
pH, UA: 5.5 (ref 5.0–7.5)

## 2019-12-17 LAB — URINE CULTURE, REFLEX

## 2019-12-17 NOTE — Telephone Encounter (Signed)
Letter sent through mychart.

## 2019-12-17 NOTE — Telephone Encounter (Signed)
OK to generate note

## 2019-12-17 NOTE — Telephone Encounter (Signed)
Copied from [place] [id number]. Topic: General - Call Back - No Documentation >> Dec 17, 2019 11:33 AM Amy Hansen wrote: Reason for CRM: Pt called to report that she needs an out of work note for the week because she has been sick and unable to work due to hyperglycemia complications. She is requesting this for 12/14/2019 - 12/18/2019  Best contact: [phone]

## 2019-12-19 ENCOUNTER — Other Ambulatory Visit: Payer: Self-pay | Admitting: Physician Assistant

## 2019-12-20 ENCOUNTER — Other Ambulatory Visit: Payer: Self-pay | Admitting: Family Medicine

## 2019-12-20 DIAGNOSIS — E119 Type 2 diabetes mellitus without complications: Secondary | ICD-10-CM

## 2019-12-20 MED ORDER — PRAMIPEXOLE DIHYDROCHLORIDE 1 MG PO TABS: 1.0000 mg | ORAL_TABLET | Freq: Every day | ORAL | 0 refills | Status: DC

## 2019-12-20 MED ORDER — PEN NEEDLES 32G X 4 MM MISC: 1.0000 [IU] | Freq: Every day | 1 refills | Status: DC

## 2019-12-20 MED ORDER — TOUJEO MAX SOLOSTAR 300 UNIT/ML ~~LOC~~ SOPN: 10.0000 [IU] | PEN_INJECTOR | Freq: Every day | SUBCUTANEOUS | 0 refills | Status: DC

## 2019-12-20 MED ORDER — METFORMIN HCL 1000 MG PO TABS: ORAL_TABLET | ORAL | 0 refills | Status: DC

## 2019-12-20 MED ORDER — GLIPIZIDE 5 MG PO TABS: ORAL_TABLET | ORAL | 1 refills | Status: DC

## 2019-12-21 NOTE — Telephone Encounter (Signed)
Last apt 09/18/2019, was due back 6 weeks

## 2019-12-22 ENCOUNTER — Telehealth: Payer: Self-pay | Admitting: Pharmacy Technician

## 2019-12-22 NOTE — Telephone Encounter (Signed)
Patient received a 30 day supply of medication.  Provided patient with new patient packet to obtain ongoing [facility] services.  [facility] must receive requested financial documentation within 30 days in order to determine eligibility and provide additional medication assistance.  [facility]

## 2019-12-28 NOTE — Assessment & Plan Note (Signed)
Unclear if or how she's been taking her medications recently. Glucose above register on point of care testing today, suspect this and significant dehydration are likely contributing to her disorientation, weight loss, poor appetite. Strongly recommending husband take her to ER for further evaluation, IV hydration and monitoring

## 2019-12-28 NOTE — Assessment & Plan Note (Signed)
>>  ASSESSMENT AND PLAN FOR DIABETES MELLITUS TYPE 2 IN NONOBESE (HCC) WRITTEN ON 12/28/2019 10:43 PM BY LANE, RACHEL ELIZABETH, PA-C  Unclear if or how she's been taking her medications recently. Glucose above register on point of care testing today, suspect this and significant dehydration are likely contributing to her disorientation, weight loss, poor appetite. Strongly recommending husband take her to ER for further evaluation, IV hydration and monitoring

## 2019-12-31 ENCOUNTER — Ambulatory Visit: Payer: Self-pay | Admitting: Family Medicine

## 2020-01-01 ENCOUNTER — Ambulatory Visit (INDEPENDENT_AMBULATORY_CARE_PROVIDER_SITE_OTHER): Payer: Self-pay | Admitting: Family Medicine

## 2020-01-01 ENCOUNTER — Other Ambulatory Visit: Payer: Self-pay

## 2020-01-01 ENCOUNTER — Encounter: Payer: Self-pay | Admitting: Family Medicine

## 2020-01-01 VITALS — BP 106/73 | HR 89 | Temp 98.3°F | Wt 130.0 lb

## 2020-01-01 DIAGNOSIS — E1165 Type 2 diabetes mellitus with hyperglycemia: Secondary | ICD-10-CM

## 2020-01-01 NOTE — Assessment & Plan Note (Signed)
Toujeo samples given until we can work with Pharmacy on her insulin rx, increase to 15 units and monitor BSs. May titrate up slowly by 2 units at a time until reaching goal of 100-150 fasting in the AMs. Continue working on diet changes.

## 2020-01-01 NOTE — Assessment & Plan Note (Signed)
>>  ASSESSMENT AND PLAN FOR DIABETES MELLITUS TYPE 2 IN NONOBESE (HCC) WRITTEN ON 01/01/2020 11:58 AM BY LANE, RACHEL ELIZABETH, PA-C  Toujeo samples given until we can work with Pharmacy on her insulin rx, increase to 15 units and monitor BSs. May titrate up slowly by 2 units at a time until reaching goal of 100-150 fasting in the AMs. Continue working on diet changes.

## 2020-01-01 NOTE — Progress Notes (Signed)
BP 106/73   Pulse 89   Temp 98.3 F (36.8 C) (Oral)   Wt 130 lb (59 kg)   LMP  (LMP Unknown)   SpO2 98%   BMI 24.56 kg/m    Subjective:    Patient ID: Amy Hansen, female    DOB: 1956-07-03, 64 y.o.   MRN: [id number]  HPI: Amy Hansen is a 64 y.o. female  Chief Complaint  Patient presents with  . Diabetes  . Weight Check   Here today for DM f/u after adding insulin for severely uncontrolled DM 2 weeks ago. Was evaluated in the ER for altered mental status, sugars in the 400-500s, anorexia and weight loss, dehydration. Restarted metformin and glipizide, and started on toujeo. Was taking 10 units of tujeo nightly prior to running out about a week ago. BSs running between 180-350 range when taking. Has gained 10 lb, appetite has returned back to a more normal pace and feeling mentally much more clear. Denies low blood sugar spells, side effects to medications.   Relevant past medical, surgical, family and social history reviewed and updated as indicated. Interim medical history since our last visit reviewed. Allergies and medications reviewed and updated.  Review of Systems  Per HPI unless specifically indicated above     Objective:    BP 106/73   Pulse 89   Temp 98.3 F (36.8 C) (Oral)   Wt 130 lb (59 kg)   LMP  (LMP Unknown)   SpO2 98%   BMI 24.56 kg/m   Wt Readings from Last 3 Encounters:  01/01/20 130 lb (59 kg)  12/15/19 120 lb (54.4 kg)  12/14/19 120 lb (54.4 kg)    Physical Exam Vitals and nursing note reviewed.  Constitutional:      Appearance: Normal appearance. She is not ill-appearing.  HENT:     Head: Atraumatic.  Eyes:     Extraocular Movements: Extraocular movements intact.     Conjunctiva/sclera: Conjunctivae normal.  Cardiovascular:     Rate and Rhythm: Normal rate and regular rhythm.     Heart sounds: Normal heart sounds.  Pulmonary:     Effort: Pulmonary effort is normal.     Breath sounds: Normal breath sounds.  Musculoskeletal:         General: Normal range of motion.     Cervical back: Normal range of motion and neck supple.  Skin:    General: Skin is warm and dry.  Neurological:     Mental Status: She is alert and oriented to person, place, and time.  Psychiatric:        Mood and Affect: Mood normal.        Thought Content: Thought content normal.        Judgment: Judgment normal.     Results for orders placed or performed during the hospital encounter of 12/14/19  Glucose, capillary  Result Value Ref Range   Glucose-Capillary 587 (HH) 70 - 99 mg/dL   Comment 1 Notify RN    Comment 2 Repeat Test   Basic metabolic panel  Result Value Ref Range   Sodium 126 (L) 135 - 145 mmol/L   Potassium 4.4 3.5 - 5.1 mmol/L   Chloride 93 (L) 98 - 111 mmol/L   CO2 21 (L) 22 - 32 mmol/L   Glucose, Bld 571 (HH) 70 - 99 mg/dL   BUN 14 8 - 23 mg/dL   Creatinine, Ser 0.87 0.44 - 1.00 mg/dL   Calcium 8.7 (L) 8.9 - 10.3  mg/dL   GFR calc non Af Amer >60 >60 mL/min   GFR calc Af Amer >60 >60 mL/min   Anion gap 12 5 - 15  CBC  Result Value Ref Range   WBC 6.8 4.0 - 10.5 K/uL   RBC 5.69 (H) 3.87 - 5.11 MIL/uL   Hemoglobin 12.6 12.0 - 15.0 g/dL   HCT 40.0 36.0 - 46.0 %   MCV 70.3 (L) 80.0 - 100.0 fL   MCH 22.1 (L) 26.0 - 34.0 pg   MCHC 31.5 30.0 - 36.0 g/dL   RDW 15.3 11.5 - 15.5 %   Platelets 469 (H) 150 - 400 K/uL   nRBC 0.0 0.0 - 0.2 %  Urinalysis, Complete w Microscopic  Result Value Ref Range   Color, Urine STRAW (A) YELLOW   APPearance CLEAR (A) CLEAR   Specific Gravity, Urine 1.031 (H) 1.005 - 1.030   pH 6.0 5.0 - 8.0   Glucose, UA >=500 (A) NEGATIVE mg/dL   Hgb urine dipstick NEGATIVE NEGATIVE   Bilirubin Urine NEGATIVE NEGATIVE   Ketones, ur NEGATIVE NEGATIVE mg/dL   Protein, ur NEGATIVE NEGATIVE mg/dL   Nitrite NEGATIVE NEGATIVE   Leukocytes,Ua NEGATIVE NEGATIVE   WBC, UA 6-10 0 - 5 WBC/hpf   Bacteria, UA NONE SEEN NONE SEEN   Squamous Epithelial / LPF 0-5 0 - 5  Blood gas, venous  Result Value Ref  Range   pH, Ven 7.32 7.250 - 7.430   pCO2, Ven 43 (L) 44.0 - 60.0 mmHg   pO2, Ven 38.0 32.0 - 45.0 mmHg   Bicarbonate 22.2 20.0 - 28.0 mmol/L   Acid-base deficit 3.9 (H) 0.0 - 2.0 mmol/L   O2 Saturation 66.7 %   Patient temperature 37.0    Collection site VEIN    Sample type VEIN   Glucose, capillary  Result Value Ref Range   Glucose-Capillary 334 (H) 70 - 99 mg/dL  Glucose, capillary  Result Value Ref Range   Glucose-Capillary 301 (H) 70 - 99 mg/dL      Assessment & Plan:   Problem List Items Addressed This Visit      Endocrine   Poorly controlled type 2 diabetes mellitus ([place]) - Primary    Toujeo samples given until we can work with Pharmacy on her insulin rx, increase to 15 units and monitor BSs. May titrate up slowly by 2 units at a time until reaching goal of 100-150 fasting in the AMs. Continue working on diet changes.           Follow up plan: Return in about 2 months (around 03/02/2020) for DM.

## 2020-01-02 ENCOUNTER — Other Ambulatory Visit: Payer: Self-pay | Admitting: Physician Assistant

## 2020-01-03 NOTE — Telephone Encounter (Signed)
Apt 06/29

## 2020-01-19 ENCOUNTER — Other Ambulatory Visit: Payer: Self-pay

## 2020-01-19 ENCOUNTER — Ambulatory Visit: Payer: Self-pay | Admitting: Pharmacy Technician

## 2020-01-19 ENCOUNTER — Ambulatory Visit: Payer: Self-pay | Admitting: Pharmacist

## 2020-01-19 ENCOUNTER — Telehealth: Payer: Self-pay | Admitting: Pharmacist

## 2020-01-19 DIAGNOSIS — E1165 Type 2 diabetes mellitus with hyperglycemia: Secondary | ICD-10-CM

## 2020-01-19 DIAGNOSIS — Z79899 Other long term (current) drug therapy: Secondary | ICD-10-CM

## 2020-01-19 NOTE — Patient Instructions (Signed)
Visit Information  Goals Addressed              This Visit's Progress     Patient Stated   .  PharmD "I can't afford my medications" (pt-stated)        CARE PLAN ENTRY (see longtitudinal plan of care for additional care plan information)  Current Barriers:  . Polypharmacy; complex patient with multiple comorbidities including HFpEF, T2DM, depression, OCD, RLS . Financial concerns - patient is uninsured. Received message from Medication Management that patient is over income for insulin assistance, based upon their total household income . Most recent eGFR: ~90 o T2DM: A1c 14%, metformin 1000 mg BID, glipizide 5 mg BID, Toujeo 20 units daily  o Depression: Managed by Donnal Moat, PA, psychiatry. sertraline 150 mg daily, bupropion XL 450 mg daily, lamotrigine 400 mg QPM, risperidone 3 mg QPM, alprazolam 0.5 mg up to 4 times daily PRN, using 2-3 times daily  o HFpEF: furosemide 20 mg daily, prescribed metoprolol succinate 12.5 mg daily per Dr. Saunders Revel after Zio placement, but patient reports that she didn't pick it up or ever start it o RLS: pramipexole 1 mg QPM  Pharmacist Clinical Goal(s):  [name] Over the next 90 days, patient will work with PharmD and provider towards optimized medication management  Interventions: . Comprehensive medication review performed; medication list updated in electronic medical record . Inter-disciplinary care team collaboration (see longitudinal plan of care) . Reviewed [employer] $99/month program savings card. Downloaded card on behalf of patient. Will collaborate w/ PCP to send Toujeo script to her local pharmacy, and I will call the copay card information in to the pharmacy.   Patient Self Care Activities:  . Patient will take medications as prescribed  Please see past updates related to this goal by clicking on the "Past Updates" button in the selected goal         The patient verbalized understanding of instructions provided today and declined a print  copy of patient instruction materials.  Plan:  - Will collaborate w/ patient and provider as above  Catie Darnelle Maffucci, PharmD, [place] [phone]

## 2020-01-19 NOTE — Telephone Encounter (Signed)
Patient needs script for Lenon Curt Solostar sent to local pharmacy to use in combination w/ $99/month card. Pended script for provider ease - please send if in agreement

## 2020-01-19 NOTE — Chronic Care Management (AMB) (Signed)
Chronic Care Management   Follow Up Note   01/19/2020 Name: Amy Hansen MRN: [id number] DOB: Dec 13, 1955  Referred by: Volney American, PA-C Reason for referral : Chronic Care Management (Medication Management)   Amy Hansen is a 64 y.o. year old female who is a primary care patient of Volney American, [place]. The CCM team was consulted for assistance with chronic disease management and care coordination needs.    Medication access needs addressed today.   Review of patient status, including review of consultants reports, relevant laboratory and other test results, and collaboration with appropriate care team members and the patient's provider was performed as part of comprehensive patient evaluation and provision of chronic care management services.    SDOH (Social Determinants of Health) assessments performed: Yes See Care Plan activities for detailed interventions related to [facility])     Outpatient Encounter Medications as of 01/19/2020  Medication Sig Note  . insulin glargine, 2 Unit Dial, (TOUJEO MAX SOLOSTAR) 300 UNIT/ML Solostar Pen Inject 10 Units into the skin daily. 01/19/2020: 20 units   . ALPRAZolam (XANAX) 0.5 MG tablet TAKE 1 TABLET BY MOUTH 4 TIMES DAILY AS NEEDED FOR  ANXIETY  MUST  MAKE  AN  APPOINTMENT  BEFORE  ANY  OTHER  REFILLS   . buPROPion (WELLBUTRIN XL) 150 MG 24 hr tablet Take 3 tablets (450 mg total) by mouth daily.   . clotrimazole (GYNE-LOTRIMIN) 1 % vaginal cream Can apply externally twice daily for 7 days 12/14/2019: As needed  . estradiol (ESTRACE VAGINAL) 0.1 MG/GM vaginal cream Apply 0.51m (pea-sized amount)  just inside the vaginal introitus with a finger-tip on Monday, Wednesday and Friday nights.   . furosemide (LASIX) 20 MG tablet Take 1 tablet (20 mg total) by mouth daily.   .Marland KitchenglipiZIDE (GLUCOTROL) 5 MG tablet TAKE 1 TABLET BY MOUTH TWICE DAILY WITH MEALS . APPOINTMENT REQUIRED FOR FUTURE REFILLS   . Insulin Pen Needle (PEN NEEDLES) 32G  X 4 MM MISC 1 Units by Does not apply route at bedtime.   . lamoTRIgine (LAMICTAL) 200 MG tablet Take 1 tablet (200 mg total) by mouth 2 (two) times daily.   . metFORMIN (GLUCOPHAGE) 1000 MG tablet TAKE 1 TABLET BY MOUTH TWICE DAILY WITH MEALS   . ONE TOUCH ULTRA TEST test strip daily. 05/30/2016: Received from: External Pharmacy  . pramipexole (MIRAPEX) 1 MG tablet Take 1 tablet (1 mg total) by mouth at bedtime.   . promethazine (PHENERGAN) 25 MG tablet Take 1 tablet (25 mg total) by mouth every 8 (eight) hours as needed for nausea or vomiting.   . risperiDONE (RISPERDAL) 3 MG tablet Take 1 tablet (3 mg total) by mouth at bedtime.   . sertraline (ZOLOFT) 100 MG tablet Take 1.5 tablets (150 mg total) by mouth daily.    No facility-administered encounter medications on file as of 01/19/2020.     Objective:   Goals Addressed              This Visit's Progress     Patient Stated   .  PharmD "I can't afford my medications" (pt-stated)        CARE PLAN ENTRY (see longtitudinal plan of care for additional care plan information)  Current Barriers:  . Polypharmacy; complex patient with multiple comorbidities including HFpEF, T2DM, depression, OCD, RLS . Financial concerns - patient is uninsured. Received message from Medication Management that patient is over income for insulin assistance, based upon their total household income . Most  recent eGFR: ~90 o T2DM: A1c 14%, metformin 1000 mg BID, glipizide 5 mg BID, Toujeo 20 units daily  o Depression: Managed by Donnal Moat, PA, psychiatry. sertraline 150 mg daily, bupropion XL 450 mg daily, lamotrigine 400 mg QPM, risperidone 3 mg QPM, alprazolam 0.5 mg up to 4 times daily PRN, using 2-3 times daily  o HFpEF: furosemide 20 mg daily, prescribed metoprolol succinate 12.5 mg daily per Dr. Saunders Revel after Zio placement, but patient reports that she didn't pick it up or ever start it o RLS: pramipexole 1 mg QPM  Pharmacist Clinical Goal(s):  [name] Over the  next 90 days, patient will work with PharmD and provider towards optimized medication management  Interventions: . Comprehensive medication review performed; medication list updated in electronic medical record . Inter-disciplinary care team collaboration (see longitudinal plan of care) . Reviewed [employer] $99/month program savings card. Downloaded card on behalf of patient. Will collaborate w/ PCP to send Toujeo script to her local pharmacy, and I will call the copay card information in to the pharmacy.   Patient Self Care Activities:  . Patient will take medications as prescribed  Please see past updates related to this goal by clicking on the "Past Updates" button in the selected goal          Plan:  - Will collaborate w/ patient and provider as above  Catie Darnelle Maffucci, PharmD, [place] [phone]

## 2020-01-19 NOTE — Progress Notes (Signed)
Patient presented with 2020 Tax Return with income that exceeds the eligibility criteria for [facility] program.  Patient stated that she is concerned about how to pay for her Toujeo.  Toujeo comes from [employer].  Income exceeds [employer]'s income limit.  Patient referred to [facility] by Catie Darnelle Maffucci, Pharmacist at [facility].  I sent Catie Darnelle Maffucci a message making her aware of the situation.  Provided patient with information about [employer].  [employer] provide medications at [facility].  Patient to reach out to their clinics to find out cost and what she needs to do to obtain the Toujeo from one of their clinics.  [place] [facility] [facility]

## 2020-01-20 ENCOUNTER — Ambulatory Visit: Payer: Self-pay | Admitting: Pharmacist

## 2020-01-20 DIAGNOSIS — E1165 Type 2 diabetes mellitus with hyperglycemia: Secondary | ICD-10-CM

## 2020-01-20 MED ORDER — TOUJEO MAX SOLOSTAR 300 UNIT/ML ~~LOC~~ SOPN: PEN_INJECTOR | SUBCUTANEOUS | 1 refills | Status: DC

## 2020-01-20 NOTE — Telephone Encounter (Signed)
Copay card information called to the pharmacy. $99 copay confirmed. Patient notified

## 2020-01-20 NOTE — Telephone Encounter (Signed)
Sent!

## 2020-01-20 NOTE — Patient Instructions (Signed)
Visit Information  Goals Addressed              This Visit's Progress     Patient Stated   .  PharmD "I can't afford my medications" (pt-stated)        CARE PLAN ENTRY (see longtitudinal plan of care for additional care plan information)  Current Barriers:  . Polypharmacy; complex patient with multiple comorbidities including HFpEF, T2DM, depression, OCD, RLS . Financial concerns - patient is uninsured. Over income for insulin assistance. Utilizing [employer] My$99 insulin program. . Most recent eGFR: ~90 o T2DM: A1c 14%, metformin 1000 mg BID, glipizide 5 mg BID, Toujeo 20 units daily  o Depression: Managed by Donnal Moat, PA, psychiatry. sertraline 150 mg daily, bupropion XL 450 mg daily, lamotrigine 400 mg QPM, risperidone 3 mg QPM, alprazolam 0.5 mg up to 4 times daily PRN, using 2-3 times daily  o HFpEF: furosemide 20 mg daily, prescribed metoprolol succinate 12.5 mg daily per Dr. Saunders Revel after Zio placement, but patient reports that she didn't pick it up or ever start it o RLS: pramipexole 1 mg QPM  Pharmacist Clinical Goal(s):  [name] Over the next 90 days, patient will work with PharmD and provider towards optimized medication management  Interventions: . Confirmed [employer] coupon card worked.  . Discussed with patient to increase Toujeo to 22 units daily. She will pick up from [facility]. Though [facility] has card information on file now, I will place the printed coupon card at the front desk for patient to pick up at her convenience .  Patient Self Care Activities:  . Patient will take medications as prescribed  Please see past updates related to this goal by clicking on the "Past Updates" button in the selected goal         The patient verbalized understanding of instructions provided today and declined a print copy of patient instruction materials.   Plan:  - Scheduled f/u call in ~ 6 weeks  Catie Darnelle Maffucci, PharmD, [place] [phone]

## 2020-01-20 NOTE — Chronic Care Management (AMB) (Signed)
Chronic Care Management   Follow Up Note   01/20/2020 Name: Amy Hansen MRN: [id number] DOB: 12/06/1955  Referred by: Volney American, PA-C Reason for referral : Chronic Care Management (Medication Management)   Amy Hansen is a 64 y.o. year old female who is a primary care patient of Volney American, [place]. The CCM team was consulted for assistance with chronic disease management and care coordination needs.    Review of patient status, including review of consultants reports, relevant laboratory and other test results, and collaboration with appropriate care team members and the patient's provider was performed as part of comprehensive patient evaluation and provision of chronic care management services.    SDOH (Social Determinants of Health) assessments performed: Yes See Care Plan activities for detailed interventions related to [facility])     Outpatient Encounter Medications as of 01/20/2020  Medication Sig Note  . ALPRAZolam (XANAX) 0.5 MG tablet TAKE 1 TABLET BY MOUTH 4 TIMES DAILY AS NEEDED FOR  ANXIETY  MUST  MAKE  AN  APPOINTMENT  BEFORE  ANY  OTHER  REFILLS   . buPROPion (WELLBUTRIN XL) 150 MG 24 hr tablet Take 3 tablets (450 mg total) by mouth daily.   . clotrimazole (GYNE-LOTRIMIN) 1 % vaginal cream Can apply externally twice daily for 7 days 12/14/2019: As needed  . estradiol (ESTRACE VAGINAL) 0.1 MG/GM vaginal cream Apply 0.86m (pea-sized amount)  just inside the vaginal introitus with a finger-tip on Monday, Wednesday and Friday nights.   . furosemide (LASIX) 20 MG tablet Take 1 tablet (20 mg total) by mouth daily.   .Marland KitchenglipiZIDE (GLUCOTROL) 5 MG tablet TAKE 1 TABLET BY MOUTH TWICE DAILY WITH MEALS . APPOINTMENT REQUIRED FOR FUTURE REFILLS   . insulin glargine, 2 Unit Dial, (TOUJEO MAX SOLOSTAR) 300 UNIT/ML Solostar Pen Inject 24 units daily. Titrate as instructed. Max daily dose 40 units   . Insulin Pen Needle (PEN NEEDLES) 32G X 4 MM MISC 1 Units by Does not  apply route at bedtime.   . lamoTRIgine (LAMICTAL) 200 MG tablet Take 1 tablet (200 mg total) by mouth 2 (two) times daily.   . metFORMIN (GLUCOPHAGE) 1000 MG tablet TAKE 1 TABLET BY MOUTH TWICE DAILY WITH MEALS   . ONE TOUCH ULTRA TEST test strip daily. 05/30/2016: Received from: External Pharmacy  . pramipexole (MIRAPEX) 1 MG tablet Take 1 tablet (1 mg total) by mouth at bedtime.   . promethazine (PHENERGAN) 25 MG tablet Take 1 tablet (25 mg total) by mouth every 8 (eight) hours as needed for nausea or vomiting.   . risperiDONE (RISPERDAL) 3 MG tablet Take 1 tablet (3 mg total) by mouth at bedtime.   . sertraline (ZOLOFT) 100 MG tablet Take 1.5 tablets (150 mg total) by mouth daily.    No facility-administered encounter medications on file as of 01/20/2020.     Objective:   Goals Addressed              This Visit's Progress     Patient Stated   .  PharmD "I can't afford my medications" (pt-stated)        CARE PLAN ENTRY (see longtitudinal plan of care for additional care plan information)  Current Barriers:  . Polypharmacy; complex patient with multiple comorbidities including HFpEF, T2DM, depression, OCD, RLS . Financial concerns - patient is uninsured. Over income for insulin assistance. Utilizing [employer] insulin program. . Most recent eGFR: ~90 o T2DM: A1c 14%, metformin 1000 mg BID, glipizide 5 mg  BID, Toujeo 20 units daily  o Depression: Managed by Donnal Moat, PA, psychiatry. sertraline 150 mg daily, bupropion XL 450 mg daily, lamotrigine 400 mg QPM, risperidone 3 mg QPM, alprazolam 0.5 mg up to 4 times daily PRN, using 2-3 times daily  o HFpEF: furosemide 20 mg daily, prescribed metoprolol succinate 12.5 mg daily per Dr. Saunders Revel after Zio placement, but patient reports that she didn't pick it up or ever start it o RLS: pramipexole 1 mg QPM  Pharmacist Clinical Goal(s):  [name] Over the next 90 days, patient will work with PharmD and provider towards optimized medication  management  Interventions: . Confirmed [employer] coupon card worked.  . Discussed with patient to increase Toujeo to 22 units daily. She will pick up from [facility]. Though [facility] has card information on file now, I will place the printed coupon card at the front desk for patient to pick up at her convenience .  Patient Self Care Activities:  . Patient will take medications as prescribed  Please see past updates related to this goal by clicking on the "Past Updates" button in the selected goal          Plan:  - Scheduled f/u call in ~ 6 weeks  Catie Darnelle Maffucci, PharmD, [place] [phone]

## 2020-01-22 ENCOUNTER — Ambulatory Visit: Payer: Self-pay | Admitting: Pharmacist

## 2020-01-22 DIAGNOSIS — E1165 Type 2 diabetes mellitus with hyperglycemia: Secondary | ICD-10-CM

## 2020-01-22 NOTE — Patient Instructions (Signed)
Visit Information  Goals Addressed              This Visit's Progress     Patient Stated     PharmD "I can't afford my medications" (pt-stated)        CARE PLAN ENTRY (see longtitudinal plan of care for additional care plan information)  Current Barriers:   Polypharmacy; complex patient with multiple comorbidities including HFpEF, T2DM, depression, OCD, RLS  Financial concerns - patient is uninsured. Over income for insulin assistance. Utilizing [employer] My$99 insulin program. Confirmed on Wednesday that they pharmacy ran this through appropriately.  Most recent eGFR: ~90 o T2DM: A1c 14%, metformin 1000 mg BID, glipizide 5 mg BID, Toujeo 22 units daily  o Depression: Managed by Donnal Moat, PA, psychiatry. sertraline 150 mg daily, bupropion XL 450 mg daily, lamotrigine 400 mg QPM, risperidone 3 mg QPM, alprazolam 0.5 mg up to 4 times daily PRN, using 2-3 times daily  o HFpEF: furosemide 20 mg daily, prescribed metoprolol succinate 12.5 mg daily per Dr. Saunders Revel after Zio placement, but patient reports that she didn't pick it up or ever start it o RLS: pramipexole 1 mg QPM  Pharmacist Clinical Goal(s):   Over the next 90 days, patient will work with PharmD and provider towards optimized medication management  Interventions:  Confirmed with patient's husband that they were able to pick up patient's San[employer]i from the pharmacy. Denied any other questions or concerns at this time.   Patient Self Care Activities:   Patient will take medications as prescribed  Please see past updates related to this goal by clicking on the "Past Updates" button in the selected goal         The patient verbalized understanding of instructions provided today and declined a print copy of patient instruction materials.   Plan:  - CCM team will outreach patient again over the next 4-6 weeks  Catie Darnelle Maffucci, PharmD, [place] [phone]

## 2020-01-22 NOTE — Chronic Care Management (AMB) (Signed)
Chronic Care Management   Follow Up Note   01/22/2020 Name: DAVINITY FANARA MRN: [id number] DOB: [birth year]  Referred by: Volney American, PA-C Reason for referral : Chronic Care Management (Medication Management)   Amy Hansen is a 64 y.o. year old female who is a primary care patient of Volney American, [place]. The CCM team was consulted for assistance with chronic disease management and care coordination needs.    Contacted patient for medication management review follow up. Spoke with husband.   Review of patient status, including review of consultants reports, relevant laboratory and other test results, and collaboration with appropriate care team members and the patient's provider was performed as part of comprehensive patient evaluation and provision of chronic care management services.    SDOH (Social Determinants of Health) assessments performed: Yes See Care Plan activities for detailed interventions related to [facility])     Outpatient Encounter Medications as of 01/22/2020  Medication Sig Note  . ALPRAZolam (XANAX) 0.5 MG tablet TAKE 1 TABLET BY MOUTH 4 TIMES DAILY AS NEEDED FOR  ANXIETY  MUST  MAKE  AN  APPOINTMENT  BEFORE  ANY  OTHER  REFILLS   . buPROPion (WELLBUTRIN XL) 150 MG 24 hr tablet Take 3 tablets (450 mg total) by mouth daily.   . clotrimazole (GYNE-LOTRIMIN) 1 % vaginal cream Can apply externally twice daily for 7 days 12/14/2019: As needed  . estradiol (ESTRACE VAGINAL) 0.1 MG/GM vaginal cream Apply 0.105m (pea-sized amount)  just inside the vaginal introitus with a finger-tip on Monday, Wednesday and Friday nights.   . furosemide (LASIX) 20 MG tablet Take 1 tablet (20 mg total) by mouth daily.   .Marland KitchenglipiZIDE (GLUCOTROL) 5 MG tablet TAKE 1 TABLET BY MOUTH TWICE DAILY WITH MEALS . APPOINTMENT REQUIRED FOR FUTURE REFILLS   . insulin glargine, 2 Unit Dial, (TOUJEO MAX SOLOSTAR) 300 UNIT/ML Solostar Pen Inject 24 units daily. Titrate as instructed. Max daily  dose 40 units   . Insulin Pen Needle (PEN NEEDLES) 32G X 4 MM MISC 1 Units by Does not apply route at bedtime.   . lamoTRIgine (LAMICTAL) 200 MG tablet Take 1 tablet (200 mg total) by mouth 2 (two) times daily.   . metFORMIN (GLUCOPHAGE) 1000 MG tablet TAKE 1 TABLET BY MOUTH TWICE DAILY WITH MEALS   . ONE TOUCH ULTRA TEST test strip daily. 05/30/2016: Received from: External Pharmacy  . pramipexole (MIRAPEX) 1 MG tablet Take 1 tablet (1 mg total) by mouth at bedtime.   . promethazine (PHENERGAN) 25 MG tablet Take 1 tablet (25 mg total) by mouth every 8 (eight) hours as needed for nausea or vomiting.   . risperiDONE (RISPERDAL) 3 MG tablet Take 1 tablet (3 mg total) by mouth at bedtime.   . sertraline (ZOLOFT) 100 MG tablet Take 1.5 tablets (150 mg total) by mouth daily.    No facility-administered encounter medications on file as of 01/22/2020.     Objective:   Goals Addressed              This Visit's Progress     Patient Stated   .  PharmD "I can't afford my medications" (pt-stated)        CARE PLAN ENTRY (see longtitudinal plan of care for additional care plan information)  Current Barriers:  . Polypharmacy; complex patient with multiple comorbidities including HFpEF, T2DM, depression, OCD, RLS . Financial concerns - patient is uninsured. Over income for insulin assistance. Utilizing [employer] insulin program. Confirmed on Wednesday  that they pharmacy ran this through appropriately. . Most recent eGFR: ~90 o T2DM: A1c 14%, metformin 1000 mg BID, glipizide 5 mg BID, Toujeo 22 units daily  o Depression: Managed by Donnal Moat, PA, psychiatry. sertraline 150 mg daily, bupropion XL 450 mg daily, lamotrigine 400 mg QPM, risperidone 3 mg QPM, alprazolam 0.5 mg up to 4 times daily PRN, using 2-3 times daily  o HFpEF: furosemide 20 mg daily, prescribed metoprolol succinate 12.5 mg daily per Dr. Saunders Revel after Zio placement, but patient reports that she didn't pick it up or ever start  it o RLS: pramipexole 1 mg QPM  Pharmacist Clinical Goal(s):  [name] Over the next 90 days, patient will work with PharmD and provider towards optimized medication management  Interventions: . Confirmed with patient's husband that they were able to pick up patient's Sanofi from the pharmacy. Denied any other questions or concerns at this time.   Patient Self Care Activities:  . Patient will take medications as prescribed  Please see past updates related to this goal by clicking on the "Past Updates" button in the selected goal          Plan:  - CCM team will outreach patient again over the next 4-6 weeks  Catie Darnelle Maffucci, PharmD, [place] [phone]

## 2020-01-26 ENCOUNTER — Other Ambulatory Visit: Payer: Self-pay

## 2020-01-26 ENCOUNTER — Encounter: Payer: Self-pay | Admitting: Physician Assistant

## 2020-01-26 ENCOUNTER — Other Ambulatory Visit: Payer: Self-pay | Admitting: Physician Assistant

## 2020-01-26 ENCOUNTER — Ambulatory Visit (INDEPENDENT_AMBULATORY_CARE_PROVIDER_SITE_OTHER): Payer: Self-pay | Admitting: Physician Assistant

## 2020-01-26 DIAGNOSIS — F319 Bipolar disorder, unspecified: Secondary | ICD-10-CM

## 2020-01-26 DIAGNOSIS — F411 Generalized anxiety disorder: Secondary | ICD-10-CM

## 2020-01-26 MED ORDER — ALPRAZOLAM 0.5 MG PO TABS: 0.5000 mg | ORAL_TABLET | Freq: Four times a day (QID) | ORAL | 5 refills | Status: DC | PRN

## 2020-01-26 NOTE — Progress Notes (Signed)
[facility] Med Check  Patient ID: Amy Hansen,  MRN: [id number]  PCP: Volney American, PA-C  Date of Evaluation: 01/26/2020 Time spent:20 minutes  Chief Complaint:  Chief Complaint    Anxiety; Depression; Insomnia      HISTORY/CURRENT STATUS: HPI For routine med check.  Doing better.  At [place] 08/2019 we increased Zoloft d/t anxiety and depression. It's helped a lot. Not needing the Xanax but qid now.  She is able to enjoy things.  Energy and motivation are good for the most part.  She continues to work at [employer].  Not crying easily.  She sleeps well most of the time.  The last time she had the Ambien refilled, it was from a different generic company evidently and it was way too strong for her.  She was very sedated the next day after she took it.  She has stopped it and is sleeping fine without any sleep aid.  No suicidal or homicidal thoughts.  Patient denies increased energy with decreased need for sleep, no increased talkativeness, no racing thoughts, no impulsivity or risky behaviors, no increased spending, no increased libido, no grandiosity. No AH/VH.  Denies dizziness, syncope, seizures, numbness, tingling, tremor, tics, unsteady gait, slurred speech, confusion. Denies muscle or joint pain, stiffness, or dystonia.  Individual Medical History/ Review of Systems: Changes? :No    Past medications for mental health diagnoses include: Trazodone, Risperdal, Zoloft, Lunesta, praises son, [employer], Prozac, Depakote, Lamictal, lithium, Wellbutrin, Xanax, Ambien, carbamazepine  Allergies: Avelox [moxifloxacin hcl in nacl], Bactrim [sulfamethoxazole-trimethoprim], Ciprofloxacin, Depakote [divalproex sodium], Imitrex [sumatriptan], and Stadol [butorphanol]  Current Medications:  Current Outpatient Medications:  .  ALPRAZolam (XANAX) 0.5 MG tablet, Take 1 tablet (0.5 mg total) by mouth 4 (four) times daily as needed for anxiety., Disp: 120 tablet, Rfl: 5 .  buPROPion  (WELLBUTRIN XL) 150 MG 24 hr tablet, Take 3 tablets (450 mg total) by mouth daily., Disp: 90 tablet, Rfl: 2 .  clotrimazole (GYNE-LOTRIMIN) 1 % vaginal cream, Can apply externally twice daily for 7 days, Disp: , Rfl:  .  estradiol (ESTRACE VAGINAL) 0.1 MG/GM vaginal cream, Apply 0.83m (pea-sized amount)  just inside the vaginal introitus with a finger-tip on Monday, Wednesday and Friday nights., Disp: 30 g, Rfl: 12 .  furosemide (LASIX) 20 MG tablet, Take 1 tablet (20 mg total) by mouth daily., Disp: 30 tablet, Rfl: 11 .  glipiZIDE (GLUCOTROL) 5 MG tablet, TAKE 1 TABLET BY MOUTH TWICE DAILY WITH MEALS . APPOINTMENT REQUIRED FOR FUTURE REFILLS, Disp: 180 tablet, Rfl: 1 .  insulin glargine, 2 Unit Dial, (TOUJEO MAX SOLOSTAR) 300 UNIT/ML Solostar Pen, Inject 24 units daily. Titrate as instructed. Max daily dose 40 units, Disp: 12 mL, Rfl: 1 .  Insulin Pen Needle (PEN NEEDLES) 32G X 4 MM MISC, 1 Units by Does not apply route at bedtime., Disp: 100 each, Rfl: 1 .  lamoTRIgine (LAMICTAL) 200 MG tablet, Take 1 tablet (200 mg total) by mouth 2 (two) times daily., Disp: 60 tablet, Rfl: 2 .  metFORMIN (GLUCOPHAGE) 1000 MG tablet, TAKE 1 TABLET BY MOUTH TWICE DAILY WITH MEALS, Disp: 180 tablet, Rfl: 0 .  ONE TOUCH ULTRA TEST test strip, daily., Disp: , Rfl:  .  pramipexole (MIRAPEX) 1 MG tablet, Take 1 tablet (1 mg total) by mouth at bedtime., Disp: 90 tablet, Rfl: 0 .  promethazine (PHENERGAN) 25 MG tablet, Take 1 tablet (25 mg total) by mouth every 8 (eight) hours as needed for nausea or vomiting., Disp: 30 tablet,  Rfl: 1 .  risperiDONE (RISPERDAL) 3 MG tablet, Take 1 tablet (3 mg total) by mouth at bedtime., Disp: 30 tablet, Rfl: 2 .  sertraline (ZOLOFT) 100 MG tablet, Take 1.5 tablets (150 mg total) by mouth daily., Disp: 45 tablet, Rfl: 2 Medication Side Effects: none  Family Medical/ Social History: Changes?  No  MENTAL HEALTH EXAM:  There were no vitals taken for this visit.There is no height or  weight on file to calculate BMI.  General Appearance: Casual, Neat, Well Groomed and Obese  Eye Contact:  Good  Speech:  Clear and Coherent and Normal Rate  Volume:  Normal  Mood:  Euthymic  Affect:  Appropriate  Thought Process:  Goal Directed and Descriptions of Associations: Intact  Orientation:  Full (Time, Place, and Person)  Thought Content: Logical   Suicidal Thoughts:  No  Homicidal Thoughts:  No  Memory:  WNL  Judgement:  Good  Insight:  Good  Psychomotor Activity:  Normal  Concentration:  Concentration: Good and Attention Span: Good  Recall:  Good  Fund of Knowledge: Good  Language: Good  Assets:  Desire for Improvement  ADL's:  Intact  Cognition: WNL  Prognosis:  Good    DIAGNOSES:    ICD-10-CM   1. Bipolar I disorder ([place])  F31.9   2. Generalized anxiety disorder  F41.1     Receiving Psychotherapy: No    RECOMMENDATIONS:  PDMP reviewed.  I provided 20 minutes of face-to-face care during this encounter. I am glad to see her doing so well! Continue Zoloft 100 mg, 1.5 pills daily. Continue Xanax 0.5 mg 4 times daily as needed.  Continue Wellbutrin XL 150 mg, 3 p.o. daily. Continue Lamictal 200 mg, 1 p.o. twice daily. Continue Mirapex 1 mg nightly (from another provider). Continue Risperdal 3 mg nightly.  (Labs are followed by another provider because of her known diabetes.) D/C ambien Recommend counseling. Return in 6 months.  Donnal Moat, PA-C

## 2020-01-29 ENCOUNTER — Telehealth: Payer: Self-pay

## 2020-01-29 ENCOUNTER — Ambulatory Visit: Payer: Self-pay | Admitting: General Practice

## 2020-01-29 NOTE — Chronic Care Management (AMB) (Signed)
  Chronic Care Management   Outreach Note  01/29/2020 Name: GEOFFREY MANKIN MRN: [id number] DOB: 1956-03-20  Referred by: Volney American, PA-C Reason for referral : Care Coordination (RNCM: Follow up- Chronic Disease Management and Care Coordination needs)   An unsuccessful telephone outreach was attempted today. The patient was referred to the case management team for assistance with care management and care coordination.   Follow Up Plan: A HIPPA compliant phone message was left for the patient providing contact information and requesting a return call.   Noreene Larsson RN, MSN, [facility] Mobile: [phone]

## 2020-02-03 ENCOUNTER — Ambulatory Visit: Payer: Self-pay

## 2020-02-03 NOTE — Chronic Care Management (AMB) (Signed)
  Care Management   Follow Up Note   02/03/2020 Name: Amy Hansen MRN: [id number] DOB: 1956-05-08  Referred by: Volney American, PA-C Reason for referral : [name] is a 64 y.o. year old female who is a primary care patient of Volney American, [place]. The care management team was consulted for assistance with care management and care coordination needs.    Review of patient status, including review of consultants reports, relevant laboratory and other test results, and collaboration with appropriate care team members and the patient's provider was performed as part of comprehensive patient evaluation and provision of chronic care management services.    LCSW completed CCM outreach attempt today but was unable to reach patient successfully. A HIPPA compliant voice message was left encouraging patient to return call once available. LCSW rescheduled CCM SW appointment as well.  A HIPPA compliant phone message was left for the patient providing contact information and requesting a return call.   Eula Fried, BSW, MSW, [facility] Care Management [email or web address] Phone: [phone]

## 2020-02-15 ENCOUNTER — Other Ambulatory Visit: Payer: Self-pay | Admitting: Physician Assistant

## 2020-03-04 ENCOUNTER — Telehealth: Payer: Self-pay | Admitting: Pharmacist

## 2020-03-04 ENCOUNTER — Ambulatory Visit: Payer: Self-pay

## 2020-03-04 NOTE — Chronic Care Management (AMB) (Deleted)
  Chronic Care Management   Outreach Note  03/04/2020 Name: Amy Hansen MRN: [id number] DOB: 30-Jul-1956  Referred by: Volney American, PA-C Reason for referral : Medication management  An unsuccessful telephone outreach was attempted today. The patient was referred to the pharmacist for assistance with care management and care coordination.   Follow Up Plan: Left HIPAA compliant message  to return my call at her convenience.  Will outreach patient in next 6-8  weeks for continued medication management concerns.    [name]. Kenton Kingfisher PharmD, [facility] [phone]

## 2020-03-04 NOTE — Progress Notes (Signed)
°  Chronic Care Management   Outreach Note  03/04/2020 Name: Amy Hansen MRN: [id number] DOB: July 12, 1956  Referred by: Volney American, PA-C Reason for referral : Medication management.  An unsuccessful telephone outreach was attempted today. The patient was referred to the pharmacist for assistance with care management and care coordination.    Follow Up Plan: Left HIPAA compliant message for her  to return my call at her convenience.  Will outreach patient in next 4-6 weeks for continued medication management concerns.   [name]. Kenton Kingfisher PharmD, [facility] [phone]

## 2020-03-04 NOTE — Progress Notes (Signed)
  Chronic Care Management   Outreach Note  03/04/2020 Name: Amy Hansen MRN: [id number] DOB: 04-14-1956  Referred by: Volney American, PA-C Reason for referral : Medication Management  An unsuccessful telephone outreach was attempted today. The patient was referred to the pharmacist for assistance with care management and care coordination.   Follow Up Plan: Left HIPAA compliant message to return my call at her convenience.  Will outreach patient in next 6-8 weeks for continued medication management concerns.    [name]. Kenton Kingfisher PharmD, [facility] [phone]

## 2020-03-07 NOTE — Progress Notes (Signed)
Pt has been rt/s for 03/14/2020

## 2020-03-11 ENCOUNTER — Telehealth: Payer: Self-pay | Admitting: General Practice

## 2020-03-11 ENCOUNTER — Telehealth: Payer: Self-pay

## 2020-03-11 NOTE — Telephone Encounter (Signed)
  Chronic Care Management   Outreach Note  03/11/2020 Name: Amy Hansen MRN: [id number] DOB: 1956-07-05  Referred by: Volney American, PA-C Reason for referral : Appointment (RNCM Follow up Call: 2nd attempt and Chronic Disease Management and Care Coordination Needs.)   A second unsuccessful telephone outreach was attempted today. The patient was referred to the case management team for assistance with care management and care coordination.   Follow Up Plan: A HIPPA compliant phone message was left for the patient providing contact information and requesting a return call.   Noreene Larsson RN, MSN, [facility] Mobile: [phone]

## 2020-03-13 ENCOUNTER — Other Ambulatory Visit: Payer: Self-pay | Admitting: Physician Assistant

## 2020-03-14 ENCOUNTER — Other Ambulatory Visit: Payer: Self-pay | Admitting: Family Medicine

## 2020-03-14 ENCOUNTER — Ambulatory Visit: Payer: Self-pay | Admitting: Pharmacist

## 2020-03-14 DIAGNOSIS — E119 Type 2 diabetes mellitus without complications: Secondary | ICD-10-CM

## 2020-03-14 NOTE — Telephone Encounter (Signed)
LOV 01/01/20  Last filled: 12/20/2019, 180 pills, with 1 refill

## 2020-03-14 NOTE — Telephone Encounter (Signed)
Requested medication (s) are due for refill today: no  Requested medication (s) are on the active medication list: yes  Last refill:  5/242021  Future visit scheduled: yes  Notes to clinic: overdue for follow up    Requested Prescriptions  Pending Prescriptions Disp Refills   glipiZIDE (GLUCOTROL) 5 MG tablet [Pharmacy Med Name: glipiZIDE 5 MG Oral Tablet] 180 tablet 0    Sig: TAKE 1 TABLET BY MOUTH TWICE DAILY WITH MEALS (APPOINTMENT  REQUIRED  FOR  FUTURE  REFILLS)      Endocrinology:  Diabetes - Sulfonylureas Failed - 03/14/2020  5:30 AM      Failed - HBA1C is between 0 and 7.9 and within 180 days    Hemoglobin A1C  Date Value Ref Range Status  03/21/2016 7.3%  Final   HB A1C (BAYER DCA - WAIVED)  Date Value Ref Range Status  12/14/2019 >14.0 (H) <7.0 % Final    Comment:                                          Diabetic Adult            <7.0                                       Healthy Adult        4.3 - 5.7                                                           (DCCT/NGSP) American Diabetes Association's Summary of Glycemic Recommendations for Adults with Diabetes: Hemoglobin A1c <7.0%. More stringent glycemic goals (A1c <6.0%) may further reduce complications at the cost of increased risk of hypoglycemia.           Passed - Valid encounter within last 6 months    Recent Outpatient Visits           2 months ago Poorly controlled type 2 diabetes mellitus [facility])   [facility] Volney American, [place]   3 months ago [place], [place], [place]   3 months ago [place], [place], [place]   6 months ago Restless legs syndrome (RLS)   [place], [place], [place]   8 months ago Hypertension, unspecified type   [facility], [place], [place]

## 2020-03-15 ENCOUNTER — Telehealth: Payer: Self-pay

## 2020-03-15 NOTE — Telephone Encounter (Signed)
Last seen 01/01/20

## 2020-03-15 NOTE — Telephone Encounter (Signed)
Requested medications are due for refill today?  Yes  Requested medications are on active medication list?  Yes  Last Refill:   12/20/2019  # 180 with no refills - note stating patient will need an appointment for further refills.    Future visit scheduled? No   Notes to Clinic:  Poorly controlled diabetic.  No future appointments scheduled.

## 2020-03-15 NOTE — Chronic Care Management (AMB) (Signed)
  Care Management   Note  03/15/2020 Name: Amy Hansen MRN: [id number] DOB: 1955/09/27  Amy Hansen is a 64 y.o. year old female who is a primary care patient of Volney American, Hershal Coria and is actively engaged with the care management team. I reached out to [name] by phone today to assist with re-scheduling a follow up visit with the RN Case Manager  Follow up plan: Unsuccessful telephone outreach attempt made. A HIPPA compliant phone message was left for the patient providing contact information and requesting a return call.  The care management team will reach out to the patient again over the next 7 days.  If patient returns call to provider office, please advise to call [facility]  at [place], [place] [address], [place], [place] [address] Direct Dial: [phone] [email or web address]: [email or web address]

## 2020-03-15 NOTE — Telephone Encounter (Signed)
Requested medications are due for refill today?  Yes  Requested medications are on active medication list?  Yes  Last Refill:   12/20/2019  # 180 with no refills - also note indicating patient would need appt for further refills.    Future visit scheduled?  No   Notes to Clinic:    Poorly controlled diabetic.  Patient does not have a future appt scheduled.

## 2020-03-17 NOTE — Chronic Care Management (AMB) (Signed)
Chronic Care Management   Follow Up Note   03/17/2020 Name: Amy Hansen MRN: [id number] DOB: 1956/07/03  Referred by: Volney American, PA-C Reason for referral : No chief complaint on file.   Amy Hansen is a 64 y.o. year old female who is a primary care patient of Volney American, [place]. The CCM team was consulted for assistance with chronic disease management and care coordination needs.    Review of patient status, including review of consultants reports, relevant laboratory and other test results, and collaboration with appropriate care team members and the patient's provider was performed as part of comprehensive patient evaluation and provision of chronic care management services.    SDOH (Social Determinants of Health) assessments performed: No See Care Plan activities for detailed interventions related to [facility])     Outpatient Encounter Medications as of 03/14/2020  Medication Sig Note  . ALPRAZolam (XANAX) 0.5 MG tablet Take 1 tablet (0.5 mg total) by mouth 4 (four) times daily as needed for anxiety.   [name] buPROPion (WELLBUTRIN XL) 150 MG 24 hr tablet Take 3 tablets (450 mg total) by mouth daily.   . clotrimazole (GYNE-LOTRIMIN) 1 % vaginal cream Can apply externally twice daily for 7 days 12/14/2019: As needed  . estradiol (ESTRACE VAGINAL) 0.1 MG/GM vaginal cream Apply 0.74m (pea-sized amount)  just inside the vaginal introitus with a finger-tip on Monday, Wednesday and Friday nights.   . furosemide (LASIX) 20 MG tablet Take 1 tablet (20 mg total) by mouth daily.   .Marland KitchenglipiZIDE (GLUCOTROL) 5 MG tablet TAKE 1 TABLET BY MOUTH TWICE DAILY WITH MEALS . APPOINTMENT REQUIRED FOR FUTURE REFILLS   . insulin glargine, 2 Unit Dial, (TOUJEO MAX SOLOSTAR) 300 UNIT/ML Solostar Pen Inject 24 units daily. Titrate as instructed. Max daily dose 40 units   . Insulin Pen Needle (PEN NEEDLES) 32G X 4 MM MISC 1 Units by Does not apply route at bedtime.   . ONE TOUCH ULTRA TEST test  strip daily. 05/30/2016: Received from: External Pharmacy  . pramipexole (MIRAPEX) 1 MG tablet Take 1 tablet (1 mg total) by mouth at bedtime.   . promethazine (PHENERGAN) 25 MG tablet Take 1 tablet (25 mg total) by mouth every 8 (eight) hours as needed for nausea or vomiting.   . risperiDONE (RISPERDAL) 3 MG tablet TAKE 1 TABLET BY MOUTH EVERY DAY AT BEDTIME   . sertraline (ZOLOFT) 100 MG tablet Take 1.5 tablets (150 mg total) by mouth daily.   . [DISCONTINUED] lamoTRIgine (LAMICTAL) 200 MG tablet Take 1 tablet (200 mg total) by mouth 2 (two) times daily.   . [DISCONTINUED] metFORMIN (GLUCOPHAGE) 1000 MG tablet TAKE 1 TABLET BY MOUTH TWICE DAILY WITH MEALS    No facility-administered encounter medications on file as of 03/14/2020.     Objective:   Goals Addressed              This Visit's Progress   .  PharmD "I can't afford my medications" (pt-stated)        CARE PLAN ENTRY (see longtitudinal plan of care for additional care plan information)  Current Barriers:  . Polypharmacy; complex patient with multiple comorbidities including HFpEF, T2DM, depression, OCD, RLS . Financial concerns - patient is uninsured. Over income for insulin assistance. Utilizing [facility] insulin program. Confirmed on Wednesday that they pharmacy ran this through appropriately. . Most recent eGFR: ~90 o T2DM: A1c 14%, metformin 1000 mg BID, glipizide 5 mg BID, Toujeo 22 units daily  o Depression:  Managed by Donnal Moat, PA, psychiatry. sertraline 150 mg daily, bupropion XL 450 mg daily, lamotrigine 400 mg QPM, risperidone 3 mg QPM, alprazolam 0.5 mg up to 4 times daily PRN, using 2-3 times daily  o HFpEF: furosemide 20 mg daily, prescribed metoprolol succinate 12.5 mg daily per Dr. Saunders Revel after Zio placement, but patient reports that she didn't pick it up or ever start it o RLS: pramipexole 1 mg QPM  Pharmacist Clinical Goal(s):  [name] Over the next 90 days, patient will work with PharmD and provider towards  optimized medication management  Interventions: . Patient with adequate supply of Toujeo. Denied any other questions or concerns at this time.   Patient Self Care Activities:  . Patient will take medications as prescribed  Please see past updates related to this goal by clicking on the "Past Updates" button in the selected goal          Plan:   The patient has been provided with contact information for the care management team and has been advised to call with any health related questions or concerns.    [name]. Kenton Kingfisher PharmD, [facility] [phone]

## 2020-03-23 NOTE — Chronic Care Management (AMB) (Signed)
°  Care Management   Note  03/23/2020 Name: TAMYIA MINICH MRN: [id number] DOB: 1956-02-09  JERZI TIGERT is a 64 y.o. year old female who is a primary care patient of Volney American, Hershal Coria and is actively engaged with the care management team. I reached out to [name] by phone today to assist with re-scheduling a follow up visit with the RN Case Manager  Follow up plan: Unsuccessful telephone outreach attempt made. A HIPPA compliant phone message was left for the patient providing contact information and requesting a return call.  The care management team will reach out to the patient again over the next 7 days.  If patient returns call to provider office, please advise to call [facility]  at [place], [place], [place], [place] [address] Direct Dial: [phone] [email or web address] Website: [email or web address]

## 2020-03-28 NOTE — Telephone Encounter (Signed)
Third Unsuccessful Outreach

## 2020-03-28 NOTE — Telephone Encounter (Signed)
Thanks Museum/gallery conservator.

## 2020-03-28 NOTE — Chronic Care Management (AMB) (Signed)
  Care Management   Note  03/28/2020 Name: Amy Hansen MRN: [id number] DOB: [birth year]  Amy Hansen is a 64 y.o. year old female who is a primary care patient of Volney American, Hershal Coria and is actively engaged with the care management team. I reached out to [name] by phone today to assist with re-scheduling a follow up visit with the RN Case Manager  Follow up plan: Unable to make contact on outreach attempts x 3. PCP Merrie Roof PA-C and Dellie Catholic RN CM  notified via routed documentation in medical record.   Noreene Larsson, [place], [place], [place] [address] Direct Dial: [phone] [email or web address] Website: [name]

## 2020-04-13 ENCOUNTER — Telehealth: Payer: Self-pay

## 2020-04-14 ENCOUNTER — Other Ambulatory Visit: Payer: Self-pay | Admitting: Family Medicine

## 2020-04-14 DIAGNOSIS — E119 Type 2 diabetes mellitus without complications: Secondary | ICD-10-CM

## 2020-04-18 ENCOUNTER — Telehealth: Payer: Self-pay | Admitting: Family Medicine

## 2020-04-18 NOTE — Telephone Encounter (Signed)
If covid test was negative, can be in person

## 2020-04-18 NOTE — Telephone Encounter (Signed)
Copied from [facility] [place] [phone]. Topic: General - Inquiry >> Apr 18, 2020  3:09 PM Greggory Keen D wrote: Reason for CRM: Pt called saying she was in acute care yesterday with a fever of 103. and said she has had diarrhea for a month.  They took her out of work until Friday .  They did a rapid Covid and it was negative.    Pt has apt on 04/21/2020 in office did it need to be virtual?   CB#  [phone]

## 2020-04-21 ENCOUNTER — Inpatient Hospital Stay
Admission: EM | Admit: 2020-04-21 | Discharge: 2020-04-24 | DRG: 872 | Disposition: A | Discharge status: Home Health | Payer: Self-pay | Attending: Internal Medicine | Admitting: Internal Medicine

## 2020-04-21 ENCOUNTER — Other Ambulatory Visit: Payer: Self-pay

## 2020-04-21 ENCOUNTER — Encounter: Payer: Self-pay | Admitting: Nurse Practitioner

## 2020-04-21 ENCOUNTER — Ambulatory Visit: Payer: Self-pay | Admitting: Nurse Practitioner

## 2020-04-21 ENCOUNTER — Emergency Department: Payer: Self-pay

## 2020-04-21 ENCOUNTER — Ambulatory Visit (INDEPENDENT_AMBULATORY_CARE_PROVIDER_SITE_OTHER): Payer: Self-pay | Admitting: Nurse Practitioner

## 2020-04-21 VITALS — BP 90/61 | HR 102 | Temp 98.5°F | Wt 124.4 lb

## 2020-04-21 DIAGNOSIS — E1165 Type 2 diabetes mellitus with hyperglycemia: Secondary | ICD-10-CM | POA: Diagnosis present

## 2020-04-21 DIAGNOSIS — A419 Sepsis, unspecified organism: Secondary | ICD-10-CM

## 2020-04-21 DIAGNOSIS — N179 Acute kidney failure, unspecified: Secondary | ICD-10-CM

## 2020-04-21 DIAGNOSIS — Z79899 Other long term (current) drug therapy: Secondary | ICD-10-CM

## 2020-04-21 DIAGNOSIS — Z87891 Personal history of nicotine dependence: Secondary | ICD-10-CM

## 2020-04-21 DIAGNOSIS — I110 Hypertensive heart disease with heart failure: Secondary | ICD-10-CM | POA: Diagnosis present

## 2020-04-21 DIAGNOSIS — K5100 Ulcerative (chronic) pancolitis without complications: Secondary | ICD-10-CM | POA: Diagnosis present

## 2020-04-21 DIAGNOSIS — D649 Anemia, unspecified: Secondary | ICD-10-CM

## 2020-04-21 DIAGNOSIS — Z881 Allergy status to other antibiotic agents status: Secondary | ICD-10-CM

## 2020-04-21 DIAGNOSIS — Z791 Long term (current) use of non-steroidal anti-inflammatories (NSAID): Secondary | ICD-10-CM

## 2020-04-21 DIAGNOSIS — E1122 Type 2 diabetes mellitus with diabetic chronic kidney disease: Secondary | ICD-10-CM | POA: Diagnosis present

## 2020-04-21 DIAGNOSIS — Z20822 Contact with and (suspected) exposure to covid-19: Secondary | ICD-10-CM | POA: Diagnosis present

## 2020-04-21 DIAGNOSIS — K219 Gastro-esophageal reflux disease without esophagitis: Secondary | ICD-10-CM | POA: Diagnosis present

## 2020-04-21 DIAGNOSIS — F419 Anxiety disorder, unspecified: Secondary | ICD-10-CM | POA: Diagnosis present

## 2020-04-21 DIAGNOSIS — D509 Iron deficiency anemia, unspecified: Secondary | ICD-10-CM | POA: Diagnosis present

## 2020-04-21 DIAGNOSIS — Z888 Allergy status to other drugs, medicaments and biological substances status: Secondary | ICD-10-CM

## 2020-04-21 DIAGNOSIS — A4150 Gram-negative sepsis, unspecified: Principal | ICD-10-CM | POA: Diagnosis present

## 2020-04-21 DIAGNOSIS — I5032 Chronic diastolic (congestive) heart failure: Secondary | ICD-10-CM | POA: Diagnosis present

## 2020-04-21 DIAGNOSIS — K529 Noninfective gastroenteritis and colitis, unspecified: Secondary | ICD-10-CM | POA: Diagnosis present

## 2020-04-21 DIAGNOSIS — R6520 Severe sepsis without septic shock: Secondary | ICD-10-CM | POA: Diagnosis present

## 2020-04-21 DIAGNOSIS — Z9884 Bariatric surgery status: Secondary | ICD-10-CM

## 2020-04-21 DIAGNOSIS — E861 Hypovolemia: Secondary | ICD-10-CM | POA: Diagnosis present

## 2020-04-21 DIAGNOSIS — N3010 Interstitial cystitis (chronic) without hematuria: Secondary | ICD-10-CM | POA: Diagnosis present

## 2020-04-21 DIAGNOSIS — N390 Urinary tract infection, site not specified: Secondary | ICD-10-CM | POA: Diagnosis present

## 2020-04-21 DIAGNOSIS — E876 Hypokalemia: Secondary | ICD-10-CM

## 2020-04-21 DIAGNOSIS — Z1619 Resistance to other specified beta lactam antibiotics: Secondary | ICD-10-CM | POA: Diagnosis present

## 2020-04-21 DIAGNOSIS — Z87440 Personal history of urinary (tract) infections: Secondary | ICD-10-CM

## 2020-04-21 DIAGNOSIS — E119 Type 2 diabetes mellitus without complications: Secondary | ICD-10-CM | POA: Diagnosis present

## 2020-04-21 DIAGNOSIS — Z794 Long term (current) use of insulin: Secondary | ICD-10-CM

## 2020-04-21 DIAGNOSIS — R197 Diarrhea, unspecified: Secondary | ICD-10-CM

## 2020-04-21 DIAGNOSIS — Z800 Family history of malignant neoplasm of digestive organs: Secondary | ICD-10-CM

## 2020-04-21 DIAGNOSIS — Z8249 Family history of ischemic heart disease and other diseases of the circulatory system: Secondary | ICD-10-CM

## 2020-04-21 DIAGNOSIS — Z823 Family history of stroke: Secondary | ICD-10-CM

## 2020-04-21 DIAGNOSIS — E871 Hypo-osmolality and hyponatremia: Secondary | ICD-10-CM

## 2020-04-21 DIAGNOSIS — F429 Obsessive-compulsive disorder, unspecified: Secondary | ICD-10-CM | POA: Diagnosis present

## 2020-04-21 DIAGNOSIS — G2581 Restless legs syndrome: Secondary | ICD-10-CM | POA: Diagnosis present

## 2020-04-21 DIAGNOSIS — R1032 Left lower quadrant pain: Secondary | ICD-10-CM

## 2020-04-21 DIAGNOSIS — F329 Major depressive disorder, single episode, unspecified: Secondary | ICD-10-CM | POA: Diagnosis present

## 2020-04-21 LAB — URINALYSIS, COMPLETE (UACMP) WITH MICROSCOPIC
Bilirubin Urine: NEGATIVE
Glucose, UA: NEGATIVE mg/dL
Hgb urine dipstick: NEGATIVE
Ketones, ur: NEGATIVE mg/dL
Nitrite: POSITIVE — AB
Protein, ur: NEGATIVE mg/dL
Specific Gravity, Urine: 1.011 (ref 1.005–1.030)
pH: 5 (ref 5.0–8.0)

## 2020-04-21 LAB — CBC
HCT: 29.6 % — ABNORMAL LOW (ref 36.0–46.0)
Hemoglobin: 9.5 g/dL — ABNORMAL LOW (ref 12.0–15.0)
MCH: 19.9 pg — ABNORMAL LOW (ref 26.0–34.0)
MCHC: 32.1 g/dL (ref 30.0–36.0)
MCV: 62.1 fL — ABNORMAL LOW (ref 80.0–100.0)
Platelets: 583 10*3/uL — ABNORMAL HIGH (ref 150–400)
RBC: 4.77 MIL/uL (ref 3.87–5.11)
RDW: 18.8 % — ABNORMAL HIGH (ref 11.5–15.5)
WBC: 19.5 10*3/uL — ABNORMAL HIGH (ref 4.0–10.5)
nRBC: 0 % (ref 0.0–0.2)

## 2020-04-21 LAB — COMPREHENSIVE METABOLIC PANEL
ALT: 14 U/L (ref 0–44)
AST: 21 U/L (ref 15–41)
Albumin: 2.4 g/dL — ABNORMAL LOW (ref 3.5–5.0)
Alkaline Phosphatase: 165 U/L — ABNORMAL HIGH (ref 38–126)
Anion gap: 15 (ref 5–15)
BUN: 14 mg/dL (ref 8–23)
CO2: 20 mmol/L — ABNORMAL LOW (ref 22–32)
Calcium: 8.1 mg/dL — ABNORMAL LOW (ref 8.9–10.3)
Chloride: 90 mmol/L — ABNORMAL LOW (ref 98–111)
Creatinine, Ser: 1.39 mg/dL — ABNORMAL HIGH (ref 0.44–1.00)
GFR calc Af Amer: 47 mL/min — ABNORMAL LOW (ref >60)
GFR calc non Af Amer: 41 mL/min — ABNORMAL LOW (ref >60)
Glucose, Bld: 219 mg/dL — ABNORMAL HIGH (ref 70–99)
Potassium: 2.7 mmol/L — CL (ref 3.5–5.1)
Sodium: 125 mmol/L — ABNORMAL LOW (ref 135–145)
Total Bilirubin: 0.5 mg/dL (ref 0.3–1.2)
Total Protein: 6.7 g/dL (ref 6.5–8.1)

## 2020-04-21 LAB — GLUCOSE, CAPILLARY: Glucose-Capillary: 145 mg/dL — ABNORMAL HIGH (ref 70–99)

## 2020-04-21 LAB — LACTIC ACID, PLASMA
Lactic Acid, Venous: 3.9 mmol/L (ref 0.5–1.9)
Lactic Acid, Venous: 2 mmol/L (ref 0.5–1.9)

## 2020-04-21 LAB — SODIUM: Sodium: 131 mmol/L — ABNORMAL LOW (ref 135–145)

## 2020-04-21 LAB — BAYER DCA HB A1C WAIVED: HB A1C (BAYER DCA - WAIVED): 7.7 % — ABNORMAL HIGH (ref <7.0)

## 2020-04-21 LAB — RESPIRATORY PANEL BY RT PCR (FLU A&B, COVID)
Influenza A by PCR: NEGATIVE
Influenza B by PCR: NEGATIVE
SARS Coronavirus 2 by RT PCR: NEGATIVE

## 2020-04-21 LAB — LIPASE, BLOOD: Lipase: 18 U/L (ref 11–51)

## 2020-04-21 IMAGING — CT CT ABD-PELV W/ CM
2 of 5 series · 15 of 46 positions shown, 17 images · IV contrast (APPLIED)
Comparison: [DATE]

CLINICAL DATA: Diarrhea for 1 month.

EXAM:
CT ABDOMEN AND PELVIS WITH CONTRAST
TECHNIQUE: Multidetector CT imaging of the abdomen and pelvis was performed
using the standard protocol following bolus administration of
intravenous contrast.
CONTRAST:  75mL OMNIPAQUE IOHEXOL 300 MG/ML  SOLN

[Series 2: routine abd/pel with · axial · 0.81mm/px · z∈[-284,+141]mm · 12 of 95 slices shown, 14 images]
[im 5/95  soft-tissue]
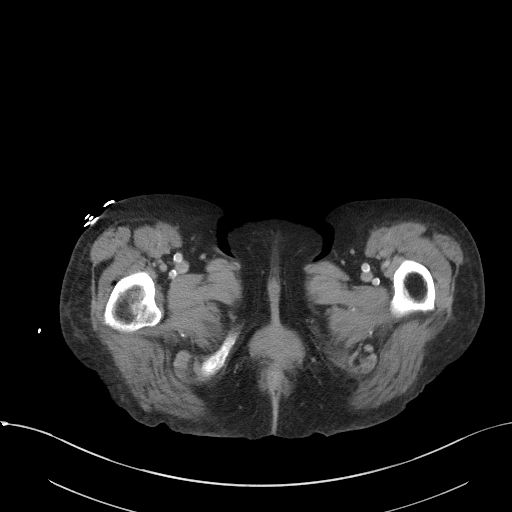
[im 5/95  bone]
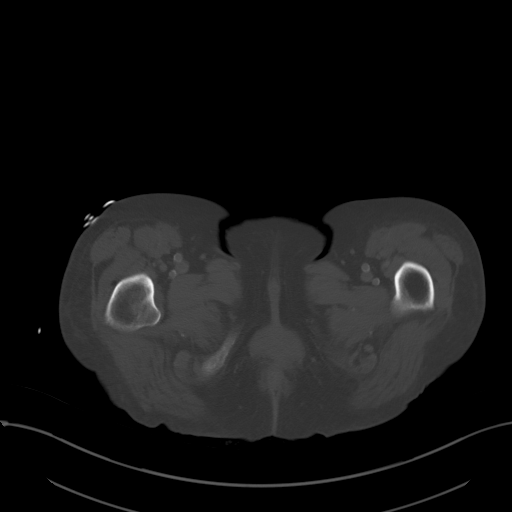
[im 15/95  soft-tissue]
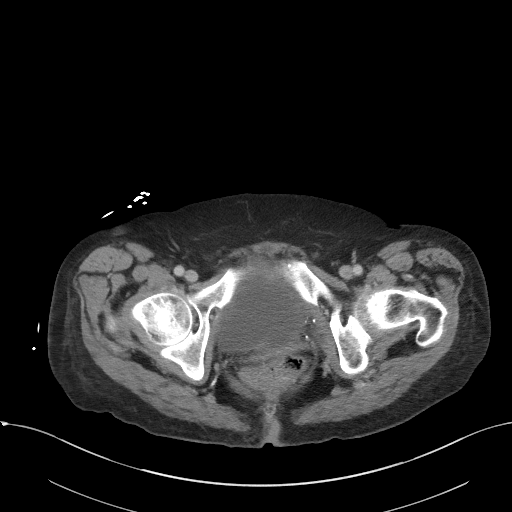
[im 20/95  soft-tissue]
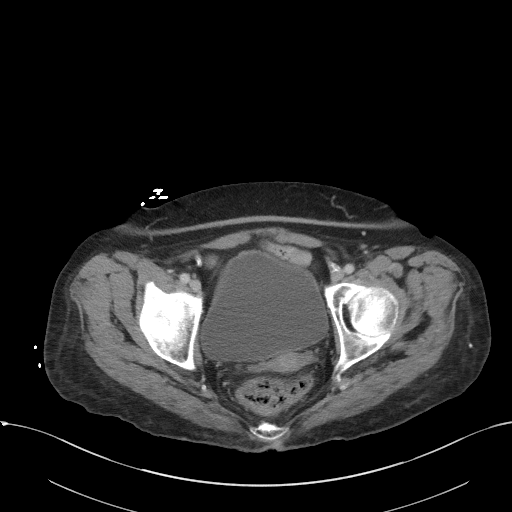
[im 30/95  soft-tissue]
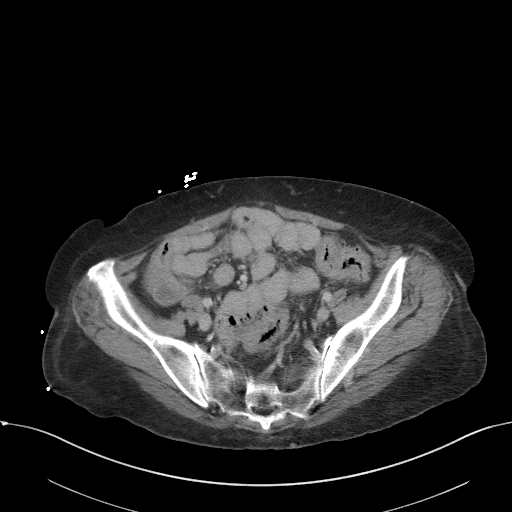
[im 35/95  soft-tissue]
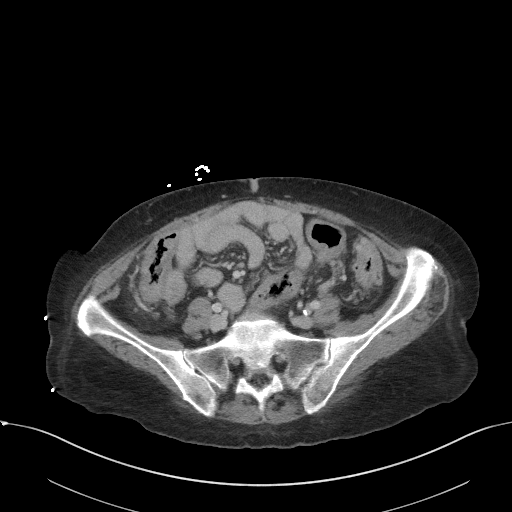
[im 45/95  soft-tissue]
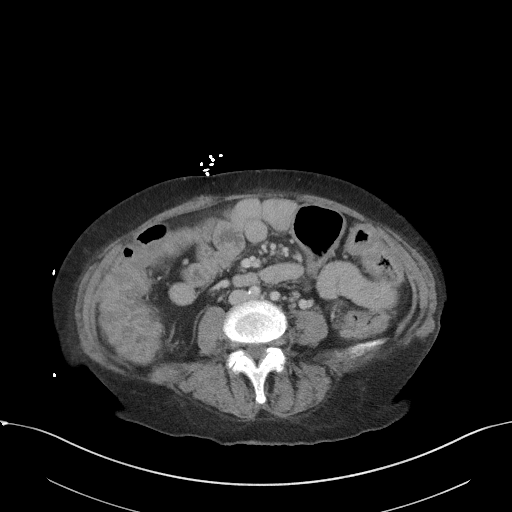
[im 50/95  soft-tissue]
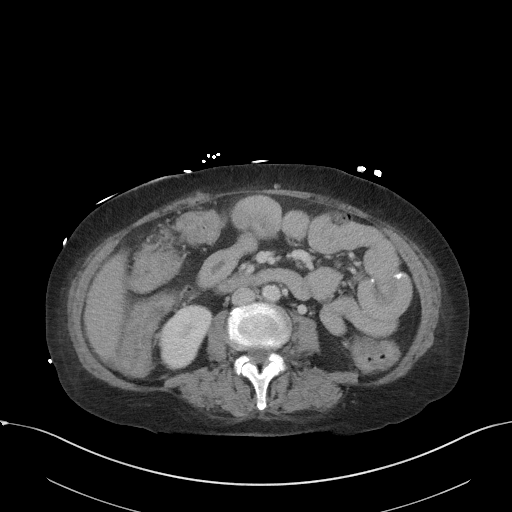
[im 60/95  soft-tissue]
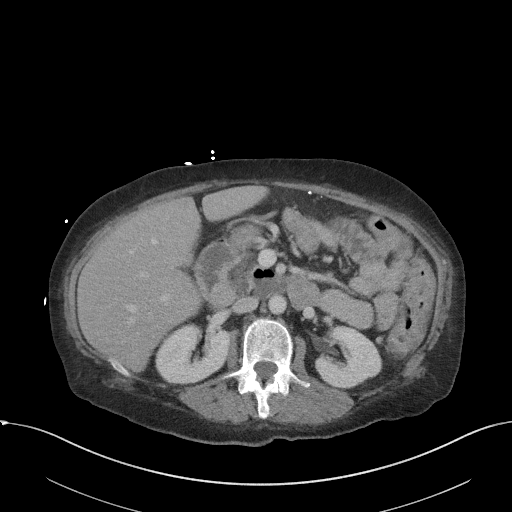
[im 65/95  soft-tissue]
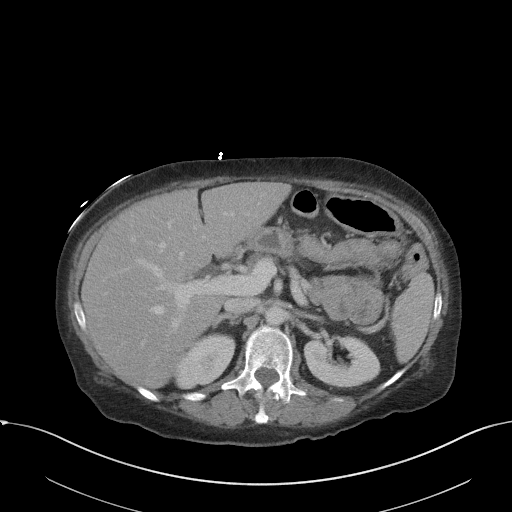
[im 65/95  bone]
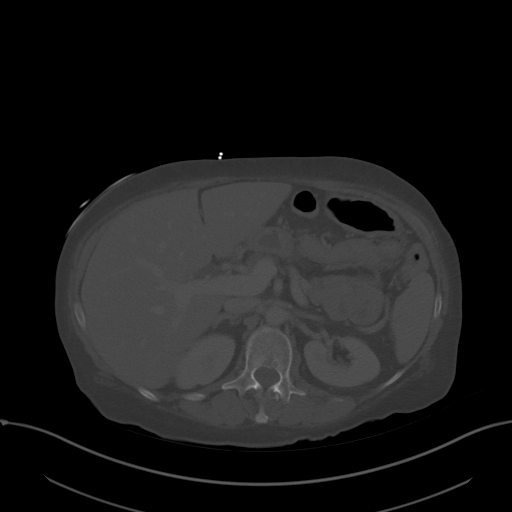
[im 75/95  soft-tissue]
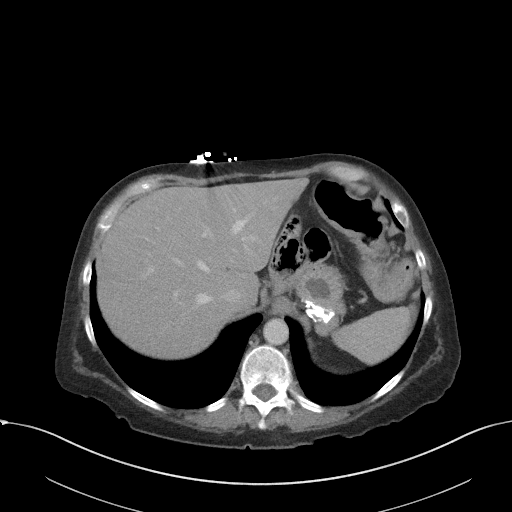
[im 80/95  soft-tissue]
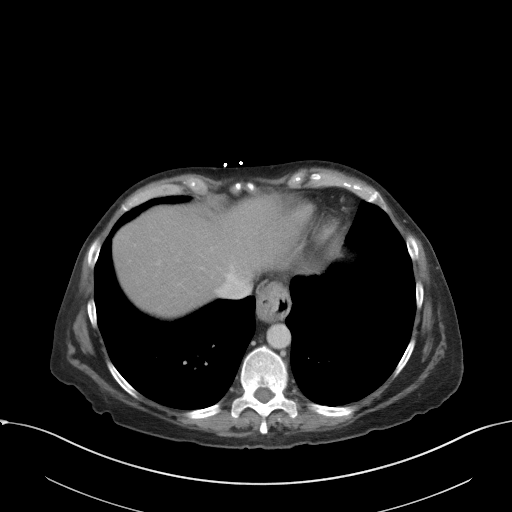
[im 90/95  soft-tissue]
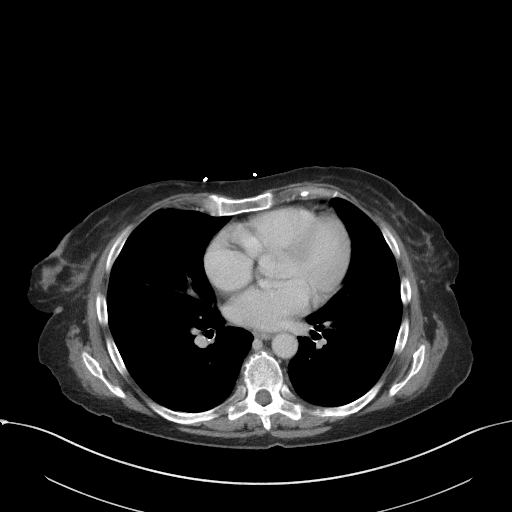

[Series 5: coronal st · coronal · 0.73mm/px · 3 of 83 slices shown]
[im 28/83  soft-tissue]
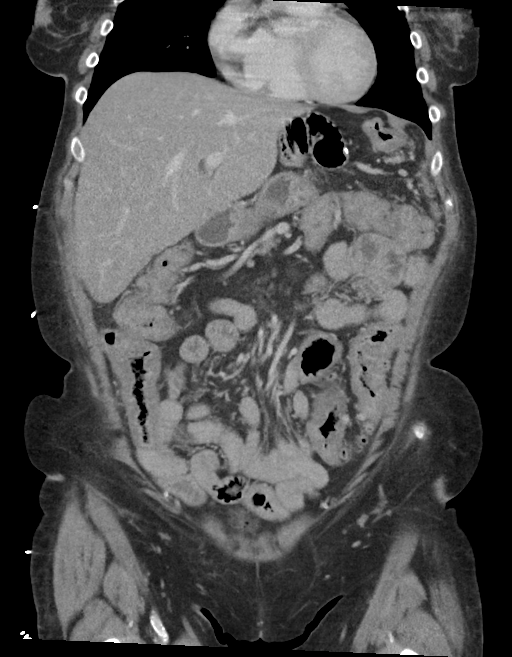
[im 37/83  soft-tissue]
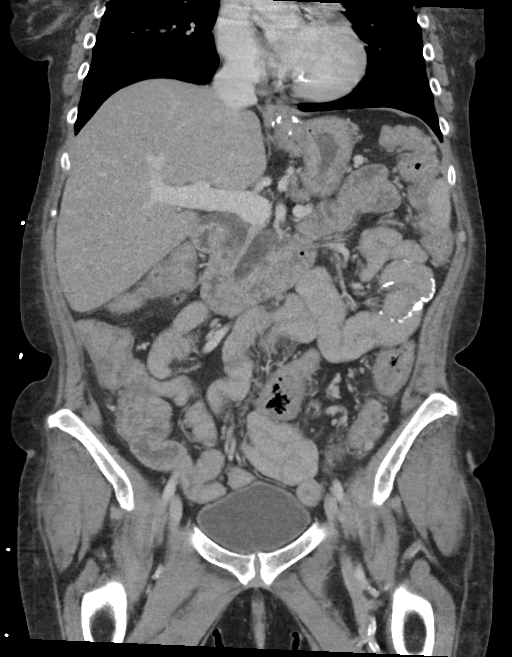
[im 46/83  soft-tissue]
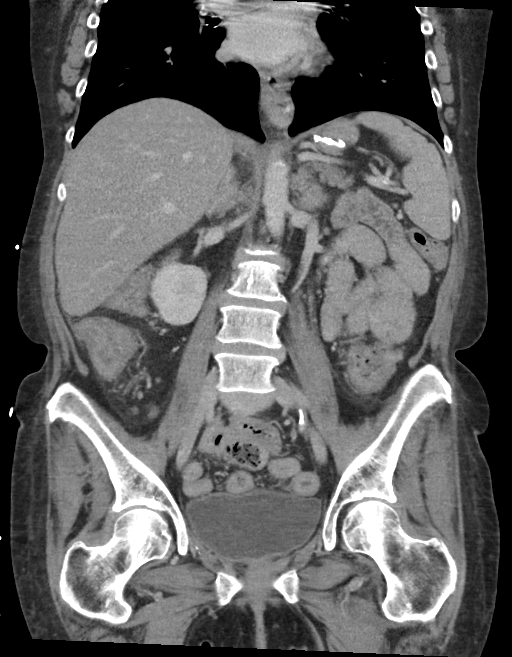

[15 of 46 positions shown; findings below may reference images not displayed]

FINDINGS: Lower chest: There are stable small pulmonary nodules at the lung
bases measuring up to approximately 5 mm. No further follow-up is
recommended.The heart size is normal.

Hepatobiliary: The liver is normal. Status post
cholecystectomy.There is mild intrahepatic and extrahepatic biliary
ductal dilatation.

Pancreas: There appear to be a cluster cysts at the pancreatic head,
similar to prior study.

Spleen: Unremarkable.

Adrenals/Urinary Tract:

--Adrenal glands: Unremarkable.

--Right kidney/ureter: No hydronephrosis or radiopaque kidney
stones.

--Left kidney/ureter: No hydronephrosis or radiopaque kidney stones.

--Urinary bladder: There is a punctate focus of gas within the
urinary bladder.

Stomach/Bowel:

--Stomach/Duodenum: There are postsurgical changes of the stomach
related to prior gastric bypass.

--Small bowel: Unremarkable.

--Colon: There is diffuse circumferential wall thickening of
virtually all of the colon, most notably at the level of the cecum
and ascending colon.

--Appendix: Normal.

Vascular/Lymphatic: Atherosclerotic calcification is present within
the non-aneurysmal abdominal aorta, without hemodynamically
significant stenosis.

--No retroperitoneal lymphadenopathy.

--there are few mildly enlarged mesenteric lymph nodes, most notably
in the right lower quadrant. These are presumably reactive.

--No pelvic or inguinal lymphadenopathy.

Reproductive: Status post hysterectomy. No adnexal mass.

Other: No ascites or free air. The abdominal wall is normal.

Musculoskeletal. No acute displaced fractures.
IMPRESSION: 1. Diffuse circumferential wall thickening of virtually all of the
colon, most notably at the level of the cecum and ascending colon,
consistent with infectious or inflammatory colitis.
2. There is a punctate focus of gas within the urinary bladder.
Correlate for history of recent instrumentation.

Aortic Atherosclerosis ([7X]-[7X]).

## 2020-04-21 MED ORDER — SODIUM CHLORIDE 0.9 % IV SOLN: INTRAVENOUS | Status: AC

## 2020-04-21 MED ORDER — INSULIN ASPART 100 UNIT/ML ~~LOC~~ SOLN: 0.0000 [IU] | Freq: Every day | SUBCUTANEOUS | Status: DC

## 2020-04-21 MED ORDER — BUPROPION HCL ER (XL) 150 MG PO TB24
450.0000 mg | ORAL_TABLET | Freq: Every day | ORAL | Status: DC
  Administered 2020-04-22 – 2020-04-24 (×3): 450 mg via ORAL
  Filled 2020-04-21 (×3): qty 3

## 2020-04-21 MED ORDER — SODIUM CHLORIDE 0.9 % IV SOLN: Freq: Once | INTRAVENOUS | Status: AC

## 2020-04-21 MED ORDER — RISPERIDONE 3 MG PO TABS
3.0000 mg | ORAL_TABLET | Freq: Every day | ORAL | Status: DC
  Administered 2020-04-21 – 2020-04-23 (×3): 3 mg via ORAL
  Filled 2020-04-21 (×2): qty 1
  Filled 2020-04-21: qty 3
  Filled 2020-04-21 (×2): qty 1

## 2020-04-21 MED ORDER — CLOTRIMAZOLE 1 % VA CREA
1.0000 | TOPICAL_CREAM | Freq: Every day | VAGINAL | Status: DC | PRN
  Filled 2020-04-21: qty 45

## 2020-04-21 MED ORDER — PANTOPRAZOLE SODIUM 40 MG IV SOLR
40.0000 mg | Freq: Every day | INTRAVENOUS | Status: DC
  Administered 2020-04-21 – 2020-04-23 (×3): 40 mg via INTRAVENOUS
  Filled 2020-04-21 (×3): qty 40

## 2020-04-21 MED ORDER — ALPRAZOLAM 0.5 MG PO TABS
0.5000 mg | ORAL_TABLET | Freq: Four times a day (QID) | ORAL | Status: DC | PRN
  Administered 2020-04-22 – 2020-04-23 (×2): 0.5 mg via ORAL
  Filled 2020-04-21 (×2): qty 1

## 2020-04-21 MED ORDER — SODIUM CHLORIDE 0.9 % IV BOLUS (SEPSIS)
500.0000 mL | Freq: Once | INTRAVENOUS | Status: AC
  Administered 2020-04-21: 500 mL via INTRAVENOUS

## 2020-04-21 MED ORDER — SODIUM CHLORIDE 0.9 % IV BOLUS (SEPSIS)
250.0000 mL | Freq: Once | INTRAVENOUS | Status: AC
  Administered 2020-04-21: 250 mL via INTRAVENOUS

## 2020-04-21 MED ORDER — VANCOMYCIN 50 MG/ML ORAL SOLUTION
125.0000 mg | Freq: Four times a day (QID) | ORAL | Status: DC
  Administered 2020-04-21: 125 mg via ORAL
  Filled 2020-04-21 (×2): qty 2.5

## 2020-04-21 MED ORDER — PIPERACILLIN-TAZOBACTAM 3.375 G IVPB
3.3750 g | Freq: Once | INTRAVENOUS | Status: AC
  Administered 2020-04-21: 3.375 g via INTRAVENOUS
  Filled 2020-04-21: qty 50

## 2020-04-21 MED ORDER — POTASSIUM CHLORIDE 10 MEQ/100ML IV SOLN
10.0000 meq | Freq: Once | INTRAVENOUS | Status: AC
  Administered 2020-04-21: 10 meq via INTRAVENOUS
  Filled 2020-04-21: qty 100

## 2020-04-21 MED ORDER — ESTRADIOL 0.1 MG/GM VA CREA
1.0000 | TOPICAL_CREAM | VAGINAL | Status: DC
  Filled 2020-04-21: qty 42.5

## 2020-04-21 MED ORDER — PRAMIPEXOLE DIHYDROCHLORIDE 0.25 MG PO TABS
1.0000 mg | ORAL_TABLET | Freq: Every day | ORAL | Status: DC
  Administered 2020-04-21 – 2020-04-23 (×3): 1 mg via ORAL
  Filled 2020-04-21: qty 1
  Filled 2020-04-21 (×2): qty 4
  Filled 2020-04-21: qty 1

## 2020-04-21 MED ORDER — SERTRALINE HCL 50 MG PO TABS
150.0000 mg | ORAL_TABLET | Freq: Every day | ORAL | Status: DC
  Administered 2020-04-22 – 2020-04-24 (×3): 150 mg via ORAL
  Filled 2020-04-21 (×3): qty 3

## 2020-04-21 MED ORDER — POTASSIUM CHLORIDE 10 MEQ/100ML IV SOLN
10.0000 meq | INTRAVENOUS | Status: AC
  Administered 2020-04-21 (×3): 10 meq via INTRAVENOUS
  Filled 2020-04-21 (×3): qty 100

## 2020-04-21 MED ORDER — INSULIN DETEMIR 100 UNIT/ML ~~LOC~~ SOLN
10.0000 [IU] | Freq: Every day | SUBCUTANEOUS | Status: DC
  Administered 2020-04-21: 10 [IU] via SUBCUTANEOUS
  Filled 2020-04-21 (×2): qty 0.1

## 2020-04-21 MED ORDER — IOHEXOL 300 MG/ML  SOLN
75.0000 mL | Freq: Once | INTRAMUSCULAR | Status: AC | PRN
  Administered 2020-04-21: 75 mL via INTRAVENOUS

## 2020-04-21 MED ORDER — INSULIN ASPART 100 UNIT/ML ~~LOC~~ SOLN
0.0000 [IU] | Freq: Three times a day (TID) | SUBCUTANEOUS | Status: DC
  Administered 2020-04-23 – 2020-04-24 (×3): 2 [IU] via SUBCUTANEOUS
  Filled 2020-04-21 (×4): qty 1

## 2020-04-21 MED ORDER — ENOXAPARIN SODIUM 40 MG/0.4ML ~~LOC~~ SOLN
40.0000 mg | SUBCUTANEOUS | Status: DC
  Administered 2020-04-21: 40 mg via SUBCUTANEOUS
  Filled 2020-04-21: qty 0.4

## 2020-04-21 MED ORDER — ACETAMINOPHEN 325 MG PO TABS
650.0000 mg | ORAL_TABLET | Freq: Once | ORAL | Status: AC
  Administered 2020-04-21: 650 mg via ORAL
  Filled 2020-04-21: qty 2

## 2020-04-21 MED ORDER — SODIUM CHLORIDE 0.9 % IV BOLUS
1000.0000 mL | Freq: Once | INTRAVENOUS | Status: AC
  Administered 2020-04-21: 1000 mL via INTRAVENOUS

## 2020-04-21 MED ORDER — PIPERACILLIN-TAZOBACTAM 3.375 G IVPB
3.3750 g | Freq: Three times a day (TID) | INTRAVENOUS | Status: DC
  Administered 2020-04-22 – 2020-04-24 (×7): 3.375 g via INTRAVENOUS
  Filled 2020-04-21 (×7): qty 50

## 2020-04-21 MED ORDER — LAMOTRIGINE 100 MG PO TABS
400.0000 mg | ORAL_TABLET | Freq: Every day | ORAL | Status: DC
  Administered 2020-04-21 – 2020-04-23 (×3): 400 mg via ORAL
  Filled 2020-04-21 (×3): qty 4

## 2020-04-21 NOTE — Assessment & Plan Note (Addendum)
Chronic, improved with Hgba1c 7.7% today.  Continue current medication regimen for now.  BMP checked today.  Follow up in 3 months.

## 2020-04-21 NOTE — ED Notes (Signed)
Assumed care of pt, reporting headache at this time, provider made aware and awaiting orders. Pt husband at bedside. Medicated per MAR. Pt made aware of NPO diet orders. AO x4. Talking in full sentences with regular and unlabored breathing.

## 2020-04-21 NOTE — Progress Notes (Signed)
BP 90/61   Pulse (!) 102   Temp 98.5 F (36.9 C) (Oral)   Wt 124 lb 6.4 oz (56.4 kg)   LMP  (LMP Unknown)   SpO2 98%   BMI 23.51 kg/m    Subjective:    Patient ID: Amy Hansen, female    DOB: Feb 22, 1956, 64 y.o.   MRN: [id number]  HPI: Amy Hansen is a 64 y.o. female presenting for follow up.  Chief Complaint  Patient presents with  . Diabetes  . Diarrhea    pt states she has been having diarrhea for the last month, chills and a fever for the last 5 days. Was seen at UC over the weekend, negative COVID. HX of diverticulitis 8 years ago per husband.    Patient reports she has had diarrhea for 1 month.  It has gotten worse the past 2 weeks and she has been wearing depends because she has been having some incontinent episodes.    This Sunday, she was running a fever of 103.  Her husband took her to the walk-in clinic where she was COVID tested and was negative.  Since then, she has been having 101.9 fever daily.  She has been vomiting and has been nauseous every night and morning.  She has been unable to eat much as "it goes right through me."    Patient is wondering if it could be H. Pylori or C. Diff.  She was taking a preventative antibiotic for UTIs (thinks Cephalexin); was not taking when symptoms started.  Husband reports 8-10 years ago, spent about 4 days in the hospital for diverticulitis.    ABDOMINAL PAIN  Duration:months Onset: sudden Severity: moderate Quality: throbbing Location:  LLQ  Episode duration: minutes Radiation: no Frequency: intermittent; before bowel movement Alleviating factors: nothing Aggravating factors: eating, drinking Status: worse Treatments attempted: pedialyte bland foods Fever: yes Nausea: yes Vomiting: yes  Episodes of vomit per day: 2-3 Description of vomit: stomach acid/bile-colored Weight loss: yes Decreased appetite: yes Diarrhea: yes ; non-bloody Episodes of diarrhea per day: 5-6 Description of diarrhea: mucus, some  solids, mostly watery Constipation: no Blood in stool: no Heartburn: yes Jaundice: no Rash: no Dysuria/urinary frequency: no Hematuria: no History of sexually transmitted disease: no Recurrent NSAID use: no   DIABETES Hypoglycemic episodes:no Polydipsia/polyuria: no Visual disturbance: no Chest pain: no Paresthesias: no Glucose Monitoring: yes  Accucheck frequency: at random times  Fasting glucose: <90 Taking Insulin?: yes  Long acting insulin: Toujeo 15 units Blood Pressure Monitoring: not checking Retinal Examination: Not up to Date Foot Exam: Not up to Date Diabetic Education: Not Completed Pneumovax: Up to Date Influenza: Not up to Date Aspirin: no  Allergies  Allergen Reactions  . Avelox [Moxifloxacin Hcl In Nacl] Anaphylaxis  . Bactrim [Sulfamethoxazole-Trimethoprim] Anaphylaxis  . Ciprofloxacin Other (See Comments)    Pt states she was told never to take this as it is in the same family as Avelox.   . Depakote [Divalproex Sodium]   . Imitrex [Sumatriptan] Other (See Comments)    Neck and shoulder pain  . Stadol [Butorphanol] Rash   Outpatient Encounter Medications as of 04/21/2020  Medication Sig Note  . ALPRAZolam (XANAX) 0.5 MG tablet Take 1 tablet (0.5 mg total) by mouth 4 (four) times daily as needed for anxiety.   [name] buPROPion (WELLBUTRIN XL) 150 MG 24 hr tablet Take 3 tablets (450 mg total) by mouth daily.   . clotrimazole (GYNE-LOTRIMIN) 1 % vaginal cream Can apply externally twice daily  for 7 days 12/14/2019: As needed  . estradiol (ESTRACE VAGINAL) 0.1 MG/GM vaginal cream Apply 0.24m (pea-sized amount)  just inside the vaginal introitus with a finger-tip on Monday, Wednesday and Friday nights.   . furosemide (LASIX) 20 MG tablet Take 1 tablet (20 mg total) by mouth daily.   .Marland KitchenglipiZIDE (GLUCOTROL) 5 MG tablet TAKE 1 TABLET BY MOUTH TWICE DAILY WITH MEALS (APPOINTMENT  REQUIRED  FOR  FUTURE  REFILLS)   . insulin glargine, 2 Unit Dial, (TOUJEO MAX SOLOSTAR)  300 UNIT/ML Solostar Pen Inject 24 units daily. Titrate as instructed. Max daily dose 40 units   . Insulin Pen Needle (PEN NEEDLES) 32G X 4 MM MISC 1 Units by Does not apply route at bedtime.   . lamoTRIgine (LAMICTAL) 200 MG tablet TAKE 2 TABLETS BY MOUTH AT BEDTIME   . metFORMIN (GLUCOPHAGE) 1000 MG tablet TAKE 1 TABLET BY MOUTH TWICE DAILY WITH MEALS . APPOINTMENT REQUIRED FOR FUTURE REFILLS   . ONE TOUCH ULTRA TEST test strip daily. 05/30/2016: Received from: External Pharmacy  . pramipexole (MIRAPEX) 1 MG tablet Take 1 tablet (1 mg total) by mouth at bedtime.   . promethazine (PHENERGAN) 25 MG tablet Take 1 tablet (25 mg total) by mouth every 8 (eight) hours as needed for nausea or vomiting.   . risperiDONE (RISPERDAL) 3 MG tablet TAKE 1 TABLET BY MOUTH EVERY DAY AT BEDTIME   . sertraline (ZOLOFT) 100 MG tablet Take 1.5 tablets (150 mg total) by mouth daily.    No facility-administered encounter medications on file as of 04/21/2020.   Patient Active Problem List   Diagnosis Date Noted  . Diarrhea 04/21/2020  . Left lower quadrant abdominal pain 04/21/2020  . Major depression, chronic 06/01/2018  . OCD (obsessive compulsive disorder) 06/01/2018  . Chronic heart failure with preserved ejection fraction ([place] 05/16/2018  . Left leg pain 11/04/2017  . Atypical chest pain 03/05/2017  . GERD (gastroesophageal reflux disease) 02/18/2017  . Iron deficiency anemia 10/18/2016  . Foot swelling 07/17/2016  . Hypertension 03/21/2016  . Vitamin D deficiency 02/21/2016  . B12 deficiency 02/21/2016  . Incontinent of feces 02/21/2016  . Poorly controlled type 2 diabetes mellitus ([place] 09/13/2015  . Dysuria 06/14/2015  . Migraines 05/23/2015  . Restless legs syndrome (RLS) 05/23/2015  . Recurrent UTI 04/26/2015  . Atrophic vaginitis 04/26/2015  . Dyspareunia, female 04/26/2015   Past Medical History:  Diagnosis Date  . Anemia   . Anxiety   . Chronic kidney disease    UTI, hematuria in urine   . Depression   . Diabetes ([place]   . Diverticulosis   . Frequent headaches   . Interstitial cystitis   . Recurrent UTI   . Restless leg syndrome   . Urinary frequency    Relevant past medical, surgical, family and social history reviewed and updated as indicated. Interim medical history since our last visit reviewed.  Review of Systems  Constitutional: Positive for activity change, appetite change, fever and unexpected weight change. Negative for fatigue.  HENT: Positive for sore throat and trouble swallowing. Negative for congestion, dental problem, ear discharge, facial swelling, mouth sores, postnasal drip, rhinorrhea and sinus pressure.   Eyes: Negative.   Respiratory: Negative.  Negative for cough, shortness of breath and wheezing.   Cardiovascular: Negative.  Negative for chest pain.  Gastrointestinal: Positive for abdominal pain, diarrhea, nausea and vomiting. Negative for blood in stool and constipation.       + acid reflux  Genitourinary: Negative.  Negative for dysuria, flank pain, frequency, hematuria and urgency.  Musculoskeletal: Negative.   Skin: Negative.  Negative for color change, pallor and rash.  Neurological: Positive for weakness and light-headedness. Negative for dizziness and headaches.  Psychiatric/Behavioral: Negative.    Per HPI unless specifically indicated above     Objective:    BP 90/61   Pulse (!) 102   Temp 98.5 F (36.9 C) (Oral)   Wt 124 lb 6.4 oz (56.4 kg)   LMP  (LMP Unknown)   SpO2 98%   BMI 23.51 kg/m   Wt Readings from Last 3 Encounters:  04/21/20 124 lb 5.4 oz (56.4 kg)  04/21/20 124 lb 6.4 oz (56.4 kg)  01/01/20 130 lb (59 kg)    Physical Exam Vitals and nursing note reviewed.  Constitutional:      General: She is not in acute distress.    Appearance: She is ill-appearing and toxic-appearing.  HENT:     Head: Normocephalic and atraumatic.     Right Ear: External ear normal.     Left Ear: External ear normal.  Eyes:      General: No scleral icterus.    Extraocular Movements: Extraocular movements intact.  Cardiovascular:     Rate and Rhythm: Regular rhythm. Tachycardia present.  Pulmonary:     Effort: Pulmonary effort is normal. No respiratory distress.     Breath sounds: Normal breath sounds. No wheezing or rhonchi.  Abdominal:     General: Bowel sounds are normal.     Palpations: There is no mass.     Tenderness: There is abdominal tenderness in the left lower quadrant. There is guarding. There is no right CVA tenderness or left CVA tenderness.  Skin:    Capillary Refill: Capillary refill takes less than 2 seconds.     Coloration: Skin is pale. Skin is not jaundiced.     Findings: No bruising, erythema, lesion or rash.  Neurological:     Mental Status: She is alert and oriented to person, place, and time.     Motor: Weakness present.     Gait: Gait normal.  Psychiatric:        Mood and Affect: Mood normal.        Behavior: Behavior normal.        Thought Content: Thought content normal.        Judgment: Judgment normal.        Assessment & Plan:   Problem List Items Addressed This Visit      Endocrine   Poorly controlled type 2 diabetes mellitus ([place]) - Primary    Chronic, improved with Hgba1c 7.7% today.  Continue current medication regimen for now.  BMP checked today.  Follow up in 3 months.      Relevant Orders   Bayer DCA Hb A1c Waived (Completed)   Basic Metabolic Panel (BMET)     Other   Diarrhea    Acute x 1 month, ongoing.  Unclear etiology although infectious organism such as C. Diff seems likely.  Also consider diverticulitis or colitis.  Stool samples obtained while in clinic today.  CBC in clinic showed WBC >17,000.  With leukocytosis, elevated HR, low BP, and fever, concern for sepsis.  Advised to emergently go to ER for further evaluation.       Relevant Orders   Cdiff NAA+O+P+Stool Culture   CBC with Differential/Platelet   Left lower quadrant abdominal pain     Acute, ongoing.  With diarrhea.  Unclear etiology although diverticulitis vs.  C. Diff colitis seems likely.  With unstable vital signs, increased WBC and unstable appearance, advised to emergently go to ER for further evaluation.            Follow up plan: Return in about 3 months (around 07/21/2020) for diabetes follow up.   45+ minutes spent with the patient today.

## 2020-04-21 NOTE — ED Notes (Signed)
One set of cultures and lactic sent down.

## 2020-04-21 NOTE — Consult Note (Signed)
Pharmacy Antibiotic Note  Amy Hansen is a 64 y.o. female admitted on 04/21/2020 with Intra-abdominal infection.  Pharmacy has been consulted for pip/tazo dosing. Pt is also on PO vancomycin. F/u with C.diff test.   Plan: Zosyn 3.375g IV q8h (4 hour infusion).  Height: 5' 2"  (157.5 cm) Weight: 56.4 kg (124 lb 5.4 oz) IBW/kg (Calculated) : 50.1  Temp (24hrs), Avg:98.2 F (36.8 C), Min:97.9 F (36.6 C), Max:98.5 F (36.9 C)  Recent Labs  Lab 04/21/20 1523 04/21/20 1642  WBC 19.5*  --   CREATININE 1.39*  --   LATICACIDVEN  --  3.9*    Estimated Creatinine Clearance: 33.2 mL/min (A) (by C-G formula based on SCr of 1.39 mg/dL (H)).    Allergies  Allergen Reactions  . Avelox [Moxifloxacin Hcl In Nacl] Anaphylaxis  . Bactrim [Sulfamethoxazole-Trimethoprim] Anaphylaxis  . Ciprofloxacin Other (See Comments)    Pt states she was told never to take this as it is in the same family as Avelox.   . Depakote [Divalproex Sodium]   . Imitrex [Sumatriptan] Other (See Comments)    Neck and shoulder pain  . Stadol [Butorphanol] Rash     Thank you for allowing pharmacy to be a part of this patient's care.  Oswald Hillock 04/21/2020 9:06 PM

## 2020-04-21 NOTE — Patient Instructions (Signed)
Diarrhea, Adult Diarrhea is frequent loose and watery bowel movements. Diarrhea can make you feel weak and cause you to become dehydrated. Dehydration can make you tired and thirsty, cause you to have a dry mouth, and decrease how often you urinate. Diarrhea typically lasts 2-3 days. However, it can last longer if it is a sign of something more serious. It is important to treat your diarrhea as told by your health care provider. Follow these instructions at home: Eating and drinking     Follow these recommendations as told by your health care provider:  Take an oral rehydration solution (ORS). This is an over-the-counter medicine that helps return your body to its normal balance of nutrients and water. It is found at pharmacies and retail stores.  Drink plenty of fluids, such as water, ice chips, diluted fruit juice, and low-calorie sports drinks. You can drink milk also, if desired.  Avoid drinking fluids that contain a lot of sugar or caffeine, such as energy drinks, sports drinks, and soda.  Eat bland, easy-to-digest foods in small amounts as you are able. These foods include bananas, applesauce, rice, lean meats, toast, and crackers.  Avoid alcohol.  Avoid spicy or fatty foods.  Medicines  Take over-the-counter and prescription medicines only as told by your health care provider.  If you were prescribed an antibiotic medicine, take it as told by your health care provider. Do not stop using the antibiotic even if you start to feel better. General instructions   Wash your hands often using soap and water. If soap and water are not available, use a hand sanitizer. Others in the household should wash their hands as well. Hands should be washed: ? After using the toilet or changing a diaper. ? Before preparing, cooking, or serving food. ? While caring for a sick person or while visiting someone in a hospital.  Drink enough fluid to keep your urine pale yellow.  Rest at home while  you recover.  Watch your condition for any changes.  Take a warm bath to relieve any burning or pain from frequent diarrhea episodes.  Keep all follow-up visits as told by your health care provider. This is important. Contact a health care provider if:  You have a fever.  Your diarrhea gets worse.  You have new symptoms.  You cannot keep fluids down.  You feel light-headed or dizzy.  You have a headache.  You have muscle cramps. Get help right away if:  You have chest pain.  You feel extremely weak or you faint.  You have bloody or black stools or stools that look like tar.  You have severe pain, cramping, or bloating in your abdomen.  You have trouble breathing or you are breathing very quickly.  Your heart is beating very quickly.  Your skin feels cold and clammy.  You feel confused.  You have signs of dehydration, such as: ? Dark urine, very little urine, or no urine. ? Cracked lips. ? Dry mouth. ? Sunken eyes. ? Sleepiness. ? Weakness. Summary  Diarrhea is frequent loose and watery bowel movements. Diarrhea can make you feel weak and cause you to become dehydrated.  Drink enough fluids to keep your urine pale yellow.  Make sure that you wash your hands after using the toilet. If soap and water are not available, use hand sanitizer.  Contact a health care provider if your diarrhea gets worse or you have new symptoms.  Get help right away if you have signs of dehydration. This  information is not intended to replace advice given to you by your health care provider. Make sure you discuss any questions you have with your health care provider. Document Revised: 12/02/2018 Document Reviewed: 12/20/2017 Elsevier Patient Education  [place].

## 2020-04-21 NOTE — ED Notes (Addendum)
Pt has K+ of 2.7, this RN attempted to call EDP who was unavailable. Secure chat sent to Charge RN and EDP.

## 2020-04-21 NOTE — Assessment & Plan Note (Signed)
Acute x 1 month, ongoing.  Unclear etiology although infectious organism such as C. Diff seems likely.  Also consider diverticulitis or colitis.  Stool samples obtained while in clinic today.  CBC in clinic showed WBC >17,000.  With leukocytosis, elevated HR, low BP, and fever, concern for sepsis.  Advised to emergently go to ER for further evaluation.

## 2020-04-21 NOTE — ED Notes (Signed)
Dr. Cherylann Banas made aware of lactic 3.9

## 2020-04-21 NOTE — ED Provider Notes (Signed)
[facility] Emergency Department Provider Note ____________________________________________   First MD Initiated Contact with Patient 04/21/20 1626     (approximate)  I have reviewed the triage vital signs and the nursing notes.   HISTORY  Chief Complaint Diarrhea, Abdominal Pain, and Nausea    HPI Amy Hansen is a 64 y.o. female with PMH as noted below who presents with diarrhea over the last month, persistent course, watery in nature, nonbloody, and occurring about 5 times per day.  It is associated with some left lower and mid abdominal pain as well as with fever.  The patient states that she had diverticulitis once several years ago but states it was not as severe as this.  She reports nausea, decreased appetite, and generalized weakness, but has not been vomiting.  She states that she was previously on an antibiotic for prevention of UTIs, but has had no other antibiotics recently.  She has not been hospitalized recently.  Past Medical History:  Diagnosis Date  . Anemia   . Anxiety   . Chronic kidney disease    UTI, hematuria in urine  . Depression   . Diabetes ([place])   . Diverticulosis   . Frequent headaches   . Interstitial cystitis   . Recurrent UTI   . Restless leg syndrome   . Urinary frequency     Patient Active Problem List   Diagnosis Date Noted  . Diarrhea 04/21/2020  . Left lower quadrant abdominal pain 04/21/2020  . Major depression, chronic 06/01/2018  . OCD (obsessive compulsive disorder) 06/01/2018  . Chronic heart failure with preserved ejection fraction ([place]) 05/16/2018  . Left leg pain 11/04/2017  . Atypical chest pain 03/05/2017  . GERD (gastroesophageal reflux disease) 02/18/2017  . Iron deficiency anemia 10/18/2016  . Foot swelling 07/17/2016  . Hypertension 03/21/2016  . Vitamin D deficiency 02/21/2016  . B12 deficiency 02/21/2016  . Incontinent of feces 02/21/2016  . Poorly controlled type 2 diabetes mellitus  ([place]) 09/13/2015  . Dysuria 06/14/2015  . Migraines 05/23/2015  . Restless legs syndrome (RLS) 05/23/2015  . Recurrent UTI 04/26/2015  . Atrophic vaginitis 04/26/2015  . Dyspareunia, female 04/26/2015    Past Surgical History:  Procedure Laterality Date  . ABDOMINAL HYSTERECTOMY    . bariatric bypass  2012  . CARPAL TUNNEL RELEASE Right 2003  . CARPAL TUNNEL RELEASE Right    2008  . CHOLECYSTECTOMY  1975  . CYSTOSCOPY W/ RETROGRADES Bilateral 06/06/2015   Procedure: CYSTOSCOPY WITH RETROGRADE PYELOGRAM;  Surgeon: Festus Aloe, MD;  Location: [facility] ORS;  Service: Urology;  Laterality: Bilateral;  . FL INJ LEFT KNEE CT ARTHROGRAM ([facility] HX) Left    1995  . GASTRIC BYPASS  2010  . [place]  2013  . KNEE ARTHROSCOPY Left 1996  . TONSILLECTOMY      Prior to Admission medications   Medication Sig Start Date End Date Taking? Authorizing Provider  ALPRAZolam Duanne Moron) 0.5 MG tablet Take 1 tablet (0.5 mg total) by mouth 4 (four) times daily as needed for anxiety. 01/26/20   Addison Lank, PA-C  buPROPion (WELLBUTRIN XL) 150 MG 24 hr tablet Take 3 tablets (450 mg total) by mouth daily. 12/16/19   Addison Lank, PA-C  clotrimazole (GYNE-LOTRIMIN) 1 % vaginal cream Can apply externally twice daily for 7 days 10/08/19   [provider]  estradiol (ESTRACE VAGINAL) 0.1 MG/GM vaginal cream Apply 0.42m (pea-sized amount)  just inside the vaginal introitus with a finger-tip on Monday, Wednesday  and Friday nights. 04/07/18   Zara Council A, PA-C  furosemide (LASIX) 20 MG tablet Take 1 tablet (20 mg total) by mouth daily. 07/29/19   End, Harrell Gave, MD  glipiZIDE (GLUCOTROL) 5 MG tablet TAKE 1 TABLET BY MOUTH TWICE DAILY WITH MEALS (APPOINTMENT  REQUIRED  FOR  FUTURE  REFILLS) 04/14/20   Volney American, PA-C  insulin glargine, 2 Unit Dial, (TOUJEO MAX SOLOSTAR) 300 UNIT/ML Solostar Pen Inject 24 units daily. Titrate as instructed. Max daily dose 40 units 01/20/20   Volney American, PA-C  Insulin Pen Needle (PEN NEEDLES) 32G X 4 MM MISC 1 Units by Does not apply route at bedtime. 12/20/19   Volney American, PA-C  lamoTRIgine (LAMICTAL) 200 MG tablet TAKE 2 TABLETS BY MOUTH AT BEDTIME 03/14/20   Hurst, Helene Kelp T, PA-C  metFORMIN (GLUCOPHAGE) 1000 MG tablet TAKE 1 TABLET BY MOUTH TWICE DAILY WITH MEALS . APPOINTMENT REQUIRED FOR FUTURE REFILLS 04/14/20   Volney American, PA-C  ONE [facility] ULTRA TEST test strip daily. 03/30/16   [provider]  pramipexole (MIRAPEX) 1 MG tablet Take 1 tablet (1 mg total) by mouth at bedtime. 12/20/19   Volney American, PA-C  promethazine (PHENERGAN) 25 MG tablet Take 1 tablet (25 mg total) by mouth every 8 (eight) hours as needed for nausea or vomiting. 09/11/19   Volney American, PA-C  risperiDONE (RISPERDAL) 3 MG tablet TAKE 1 TABLET BY MOUTH EVERY DAY AT BEDTIME 02/16/20   Donnal Moat T, PA-C  sertraline (ZOLOFT) 100 MG tablet Take 1.5 tablets (150 mg total) by mouth daily. 12/16/19   Donnal Moat T, PA-C    Allergies Avelox [moxifloxacin hcl in nacl], Bactrim [sulfamethoxazole-trimethoprim], Ciprofloxacin, Depakote [divalproex sodium], Imitrex [sumatriptan], and Stadol [butorphanol]  Family History  Problem Relation Age of Onset  . Stroke Father   . Colon cancer Mother   . Heart failure Sister   . Bladder Cancer Neg Hx   . Kidney disease Neg Hx   . Prostate cancer Neg Hx   . Kidney cancer Neg Hx     Social History Social History   Tobacco Use  . Smoking status: Former Smoker    Packs/day: 0.50    Types: Cigarettes    Quit date: 04/25/1975    Years since quitting: 45.0  . Smokeless tobacco: Never Used  . Tobacco comment: quit 40 years  Vaping Use  . Vaping Use: Never used  Substance Use Topics  . Alcohol use: No    Alcohol/week: 0.0 standard drinks  . Drug use: No    Review of Systems  Constitutional: Positive for fever and weakness. Eyes: No redness. ENT: No sore  throat. Cardiovascular: Denies chest pain. Respiratory: Denies shortness of breath. Gastrointestinal: Positive for nausea and diarrhea.  Genitourinary: Negative for dysuria.  Musculoskeletal: Negative for back pain. Skin: Negative for rash. Neurological: Negative for headache.   ____________________________________________   PHYSICAL EXAM:  VITAL SIGNS: ED Triage Vitals  Enc Vitals Group     BP 04/21/20 1506 (!) 85/59     Pulse Rate 04/21/20 1506 (!) 101     Resp 04/21/20 1506 16     Temp 04/21/20 1506 97.9 F (36.6 C)     Temp Source 04/21/20 1506 Oral     SpO2 04/21/20 1506 100 %     Weight 04/21/20 1508 124 lb 5.4 oz (56.4 kg)     Height 04/21/20 1508 5' 2"  (1.575 m)     Head Circumference --  Peak Flow --      Pain Score 04/21/20 1507 5     Pain Loc --      Pain Edu? --      Excl. in [place]? --     Constitutional: Alert and oriented.  Somewhat weak appearing but in no acute distress. Eyes: Conjunctivae are normal.  Head: Atraumatic. Nose: No congestion/rhinnorhea. Mouth/Throat: Mucous membranes are dry.   Neck: Normal range of motion.  Cardiovascular: Normal rate, regular rhythm.   Good peripheral circulation. Respiratory: Normal respiratory effort.  No retractions. Gastrointestinal: Soft with mild left lower/mid abdominal tenderness.  No distention.  Genitourinary: No flank tenderness. Musculoskeletal: No lower extremity edema.  Extremities warm and well perfused.  Neurologic:  Normal speech and language. No gross focal neurologic deficits are appreciated.  Skin:  Skin is warm and dry. No rash noted. Psychiatric: Mood and affect are normal. Speech and behavior are normal.  ____________________________________________   LABS (all labs ordered are listed, but only abnormal results are displayed)  Labs Reviewed  COMPREHENSIVE METABOLIC PANEL - Abnormal; Notable for the following components:      Result Value   Sodium 125 (*)    Potassium 2.7 (*)     Chloride 90 (*)    CO2 20 (*)    Glucose, Bld 219 (*)    Creatinine, Ser 1.39 (*)    Calcium 8.1 (*)    Albumin 2.4 (*)    Alkaline Phosphatase 165 (*)    GFR calc non Af Amer 41 (*)    GFR calc Af Amer 47 (*)    All other components within normal limits  CBC - Abnormal; Notable for the following components:   WBC 19.5 (*)    Hemoglobin 9.5 (*)    HCT 29.6 (*)    MCV 62.1 (*)    MCH 19.9 (*)    RDW 18.8 (*)    Platelets 583 (*)    All other components within normal limits  URINALYSIS, COMPLETE (UACMP) WITH MICROSCOPIC - Abnormal; Notable for the following components:   Color, Urine YELLOW (*)    APPearance CLOUDY (*)    Nitrite POSITIVE (*)    Leukocytes,Ua LARGE (*)    Bacteria, UA MANY (*)    All other components within normal limits  LACTIC ACID, PLASMA - Abnormal; Notable for the following components:   Lactic Acid, Venous 3.9 (*)    All other components within normal limits  RESPIRATORY PANEL BY RT PCR (FLU A&B, COVID)  GASTROINTESTINAL PANEL BY PCR, STOOL (REPLACES STOOL CULTURE)  C DIFFICILE QUICK SCREEN W PCR REFLEX  LIPASE, BLOOD  LACTIC ACID, PLASMA   ____________________________________________  EKG  ED ECG REPORT I, Arta Silence, the attending physician, personally viewed and interpreted this ECG.  Date: 04/21/2020 EKG Time: 1758 Rate: 105 Rhythm: Sinus tachycardia QRS Axis: normal Intervals: normal ST/T Wave abnormalities: normal Narrative Interpretation: no evidence of acute ischemia  ____________________________________________  RADIOLOGY  CT abdomen: Diffuse colitis with no evidence of abscess or other complication  ____________________________________________   PROCEDURES  Procedure(s) performed: No  Procedures  Critical Care performed: No ____________________________________________   INITIAL IMPRESSION / ASSESSMENT AND PLAN / ED COURSE  Pertinent labs & imaging results that were available during my care of the patient  were reviewed by me and considered in my medical decision making (see chart for details).  64 year old female with PMH as noted above presents with diarrhea for the last month associated with nausea, decreased appetite, generalized weakness, and over the  last week left sided abdominal pain and fever.  I reviewed the past medical records in epic; the patient has had no recent ED visits or admissions.  On exam, she is somewhat weak and pale appearing but in no acute distress.  She is slightly hypotensive and orthostatic, with mild tachycardia when standing.  Her other vital signs are normal.  The abdomen is soft with some mild left lower quadrant tenderness but no peritoneal signs.  Initial lab work-up obtained from triage reveals significantly elevated WBC count as well as hyponatremia and hypokalemia.  Differential includes diverticulitis, colitis, enteritis, C. difficile, or other infectious diarrhea.  I suspect that the orthostatic hypotension is more related to dehydration and hypovolemia rather than sepsis.  We will obtain additional labs including a lactic acid, a CT scan of the abdomen, and give fluids and IV potassium.  ----------------------------------------- 8:16 PM on 04/21/2020 -----------------------------------------  CT shows evidence of diffuse colitis.  I have ordered IV Zosyn as the patient is allergic to ciprofloxacin.  The lactic acid is also elevated, and fluids have been ordered per the sepsis protocol.  CT also shows a punctate focus of air in the bladder.  The patient has no history of bladder instrumentation, however she has no tenderness there or peritoneal signs, and there is no evidence that this is clinically significant.  I discussed the case with Dr. Flossie Buffy from the hospitalist service for admission. ____________________________________________   FINAL CLINICAL IMPRESSION(S) / ED DIAGNOSES  Final diagnoses:  Colitis      NEW MEDICATIONS STARTED DURING THIS  VISIT:  New Prescriptions   No medications on file     Note:  This document was prepared using Dragon voice recognition software and may include unintentional dictation errors.    Arta Silence, MD 04/21/20 2017

## 2020-04-21 NOTE — ED Triage Notes (Signed)
Says she has had diarrhea for a month now.  Has been seen by her doctor.  She went to them to day and was sent here for dehydration.  She says she did get stool specimens today.  Says she cannot eat--she is nauseated and dry heaving.

## 2020-04-21 NOTE — Assessment & Plan Note (Signed)
>>  ASSESSMENT AND PLAN FOR DIABETES MELLITUS TYPE 2 IN NONOBESE (HCC) WRITTEN ON 04/21/2020  4:33 PM BY MARTINEZ, JESSICA A, NP  Chronic, improved with Hgba1c 7.7% today.  Continue current medication regimen for now.  BMP checked today.  Follow up in 3 months.

## 2020-04-21 NOTE — Assessment & Plan Note (Signed)
Acute, ongoing.  With diarrhea.  Unclear etiology although diverticulitis vs. C. Diff colitis seems likely.  With unstable vital signs, increased WBC and unstable appearance, advised to emergently go to ER for further evaluation.

## 2020-04-21 NOTE — H&P (Signed)
History and Physical    Amy Hansen QTM:[id number] DOB: [birth year] DOA: 04/21/2020  PCP: Volney American, PA-C  Patient coming from: Home  I have personally briefly reviewed patient's old medical records in [place]  Chief Complaint: Persistent diarrhea  HPI: Amy Hansen is a 64 y.o. female with medical history significant for chronic UTI on chronic prophylactic Keflex, hypertension, migraine, insulin-dependent type 2 diabetes, chronic diastolic heart failure, GERD, restless leg syndrome, OCD who presents with persistent diarrhea and weakness.  Patient reports that she started having diarrhea a month ago that has been persistent.  Has about 5 episodes a day.  Denies any bright red blood per rectum or dark stools but does note mucus.  She also has been having fever up to 103 for the past 4 days.  Prior to this she had been eating more ice cream but states they were lactose-free.  She has since discontinued this.  She denies any recent travels.  Does not drink any well water.  Husband who lives with her does not have any symptoms.  She has had history of diverticulitis flareups requiring hospitalization and states she has mostly left lower quadrant pain.  She notes bad reflux and has been feeling nauseous when she tries to eat.   She has chronic UTI and has been on Keflex for the past year but stopped this 3 weeks ago.  She is followed by urology outpatient.  She states that normally when she has an acute UTI she notices dysuria and odor but has not noted any of the symptoms this time. She denies any alcohol or illicit drug use. Her abdominal surgeries include cholecystectomy, total hysterectomy and bariatric gastric bypass about 10 years ago.  ED Course: She was afebrile, hypotensive down to 70s over 50s and was tachycardic on room air.  Has significant leukocytosis of 19.5, lactate acid of 3.9. microcytic anemia of 9.5 compared to 12.64 months ago.  Thrombocytosis of  583. Hyponatremia 125, hypokalemia of 2.7, glucose of 219, creatinine with AKI of 1.39 from prior of 0.75, LFTs otherwise normal.  UA shows positive nitrate, large leukocytes and many bacteria.  CT abdomen pelvis showed diffuse circumferential wall thickening of all of the colon reflecting colitis.  There is also a punctate focus of gas within the urinary bladder.  Review of Systems:  Constitutional: No Weight Change, + Fever ENT/Mouth: No sore throat, No Rhinorrhea Eyes: No Eye Pain, No Vision Changes Cardiovascular: No Chest Pain, no SOB Respiratory: No Cough, No Sputum, No Wheezing, no Dyspnea  Gastrointestinal: + Nausea, No Vomiting, + Diarrhea, No Constipation,+ Pain Genitourinary: no Urinary Incontinence, No Urgency, No Flank Pain Musculoskeletal: No Arthralgias, No Myalgias Skin: No Skin Lesions, No Pruritus, Neuro:+ Weakness, No Numbness,  No Loss of Consciousness, No Syncope Psych: No Anxiety/Panic, No Depression, no decrease appetite Heme/Lymph: No Bruising, No Bleeding Past Medical History:  Diagnosis Date  . Anemia   . Anxiety   . Chronic kidney disease    UTI, hematuria in urine  . Depression   . Diabetes ([place])   . Diverticulosis   . Frequent headaches   . Interstitial cystitis   . Recurrent UTI   . Restless leg syndrome   . Urinary frequency     Past Surgical History:  Procedure Laterality Date  . ABDOMINAL HYSTERECTOMY    . bariatric bypass  2012  . CARPAL TUNNEL RELEASE Right 2003  . CARPAL TUNNEL RELEASE Right    2008  . CHOLECYSTECTOMY  1975  .  CYSTOSCOPY W/ RETROGRADES Bilateral 06/06/2015   Procedure: CYSTOSCOPY WITH RETROGRADE PYELOGRAM;  Surgeon: Festus Aloe, MD;  Location: [facility] ORS;  Service: Urology;  Laterality: Bilateral;  . FL INJ LEFT KNEE CT ARTHROGRAM ([facility] HX) Left    1995  . GASTRIC BYPASS  2010  . Home  2013  . KNEE ARTHROSCOPY Left 1996  . TONSILLECTOMY       reports that she quit smoking about 45 years ago.  Her smoking use included cigarettes. She smoked 0.50 packs per day. She has never used smokeless tobacco. She reports that she does not drink alcohol and does not use drugs. Social History  Allergies  Allergen Reactions  . Avelox [Moxifloxacin Hcl In Nacl] Anaphylaxis  . Bactrim [Sulfamethoxazole-Trimethoprim] Anaphylaxis  . Ciprofloxacin Other (See Comments)    Pt states she was told never to take this as it is in the same family as Avelox.   . Depakote [Divalproex Sodium]   . Imitrex [Sumatriptan] Other (See Comments)    Neck and shoulder pain  . Stadol [Butorphanol] Rash    Family History  Problem Relation Age of Onset  . Stroke Father   . Colon cancer Mother   . Heart failure Sister   . Bladder Cancer Neg Hx   . Kidney disease Neg Hx   . Prostate cancer Neg Hx   . Kidney cancer Neg Hx      Prior to Admission medications   Medication Sig Start Date End Date Taking? Authorizing Provider  ALPRAZolam Duanne Moron) 0.5 MG tablet Take 1 tablet (0.5 mg total) by mouth 4 (four) times daily as needed for anxiety. 01/26/20   Addison Lank, PA-C  buPROPion (WELLBUTRIN XL) 150 MG 24 hr tablet Take 3 tablets (450 mg total) by mouth daily. 12/16/19   Addison Lank, PA-C  clotrimazole (GYNE-LOTRIMIN) 1 % vaginal cream Can apply externally twice daily for 7 days 10/08/19   [provider]  estradiol (ESTRACE VAGINAL) 0.1 MG/GM vaginal cream Apply 0.91m (pea-sized amount)  just inside the vaginal introitus with a finger-tip on Monday, Wednesday and Friday nights. 04/07/18   MZara CouncilA, PA-C  furosemide (LASIX) 20 MG tablet Take 1 tablet (20 mg total) by mouth daily. 07/29/19   End, CHarrell Gave MD  glipiZIDE (GLUCOTROL) 5 MG tablet TAKE 1 TABLET BY MOUTH TWICE DAILY WITH MEALS (APPOINTMENT  REQUIRED  FOR  FUTURE  REFILLS) Patient taking differently: Take 5 mg by mouth 2 (two) times daily before a meal. TAKE 1 TABLET BY MOUTH TWICE DAILY WITH MEALS (APPOINTMENT  REQUIRED  FOR  FUTURE   REFILLS) 04/14/20   LVolney American PA-C  insulin glargine, 2 Unit Dial, (TOUJEO MAX SOLOSTAR) 300 UNIT/ML Solostar Pen Inject 24 units daily. Titrate as instructed. Max daily dose 40 units 01/20/20   LVolney American PA-C  Insulin Pen Needle (PEN NEEDLES) 32G X 4 MM MISC 1 Units by Does not apply route at bedtime. 12/20/19   LVolney American PA-C  lamoTRIgine (LAMICTAL) 200 MG tablet TAKE 2 TABLETS BY MOUTH AT BEDTIME Patient taking differently: Take 400 mg by mouth at bedtime.  03/14/20   HDonnal MoatT, PA-C  metFORMIN (GLUCOPHAGE) 1000 MG tablet TAKE 1 TABLET BY MOUTH TWICE DAILY WITH MEALS . APPOINTMENT REQUIRED FOR FUTURE REFILLS Patient taking differently: Take 1,000 mg by mouth 2 (two) times daily with a meal. TAKE 1 TABLET BY MOUTH TWICE DAILY WITH MEALS . APPOINTMENT REQUIRED FOR FUTURE REFILLS 04/14/20   LVolney American PA-C  ONE TOUCH ULTRA TEST test strip daily. 03/30/16   [provider]  pramipexole (MIRAPEX) 1 MG tablet Take 1 tablet (1 mg total) by mouth at bedtime. 12/20/19   Volney American, PA-C  promethazine (PHENERGAN) 25 MG tablet Take 1 tablet (25 mg total) by mouth every 8 (eight) hours as needed for nausea or vomiting. 09/11/19   Volney American, PA-C  risperiDONE (RISPERDAL) 3 MG tablet TAKE 1 TABLET BY MOUTH EVERY DAY AT BEDTIME Patient taking differently: Take 3 mg by mouth at bedtime.  02/16/20   Donnal Moat T, PA-C  sertraline (ZOLOFT) 100 MG tablet Take 1.5 tablets (150 mg total) by mouth daily. 12/16/19   Addison Lank, PA-C    Physical Exam: Vitals:   04/21/20 1508 04/21/20 1514 04/21/20 1810 04/21/20 2000  BP:  (!) 76/56 118/64 123/67  Pulse:  (!) 113 98 (!) 104  Resp:   18 18  Temp:      TempSrc:      SpO2:   98% 100%  Weight: 56.4 kg     Height: 5' 2"  (1.575 m)       Constitutional: NAD, calm, comfortable, ill appearing elderly female laying flat in bed Vitals:   04/21/20 1508 04/21/20 1514 04/21/20 1810  04/21/20 2000  BP:  (!) 76/56 118/64 123/67  Pulse:  (!) 113 98 (!) 104  Resp:   18 18  Temp:      TempSrc:      SpO2:   98% 100%  Weight: 56.4 kg     Height: 5' 2"  (1.575 m)      Eyes: PERRL, lids and conjunctivae normal ENMT: Mucous membranes are moist. Posterior pharynx clear of any exudate or lesions.Normal dentition. Cobblestoning of posterior tongue. Neck: normal, supple, no masses, no thyromegaly Respiratory: clear to auscultation bilaterally, no wheezing, no crackles. Normal respiratory effort. No accessory muscle use.  Cardiovascular: Regular rate and rhythm, no murmurs / rubs / gallops. No extremity edema.   Abdomen: moderate tenderness to left upper and lower quadrants without any guarding, rigidity or rebound tenderness, no masses palpated.  Bowel sounds positive.  Musculoskeletal: no clubbing / cyanosis. No joint deformity upper and lower extremities. Good ROM, no contractures. Normal muscle tone.  Skin: no rashes, lesions, ulcers. No induration Neurologic: CN 2-12 grossly intact. Sensation intact. Strength 5/5 in all 4.  Psychiatric: Normal judgment and insight. Alert and oriented x 3. Normal mood.     Labs on Admission: I have personally reviewed following labs and imaging studies  CBC: Recent Labs  Lab 04/21/20 1523  WBC 19.5*  HGB 9.5*  HCT 29.6*  MCV 62.1*  PLT 174*   Basic Metabolic Panel: Recent Labs  Lab 04/21/20 1523  NA 125*  K 2.7*  CL 90*  CO2 20*  GLUCOSE 219*  BUN 14  CREATININE 1.39*  CALCIUM 8.1*   GFR: Estimated Creatinine Clearance: 33.2 mL/min (A) (by C-G formula based on SCr of 1.39 mg/dL (H)). Liver Function Tests: Recent Labs  Lab 04/21/20 1523  AST 21  ALT 14  ALKPHOS 165*  BILITOT 0.5  PROT 6.7  ALBUMIN 2.4*   Recent Labs  Lab 04/21/20 1523  LIPASE 18   No results for input(s): AMMONIA in the last 168 hours. Coagulation Profile: No results for input(s): INR, PROTIME in the last 168 hours. Cardiac Enzymes: No  results for input(s): CKTOTAL, CKMB, CKMBINDEX, TROPONINI in the last 168 hours. BNP (last 3 results) No results for input(s): PROBNP in the  last 8760 hours. HbA1C: Recent Labs    04/21/20 1350  HGBA1C 7.7*   CBG: No results for input(s): GLUCAP in the last 168 hours. Lipid Profile: No results for input(s): CHOL, HDL, LDLCALC, TRIG, CHOLHDL, LDLDIRECT in the last 72 hours. Thyroid Function Tests: No results for input(s): TSH, T4TOTAL, FREET4, T3FREE, THYROIDAB in the last 72 hours. Anemia Panel: No results for input(s): VITAMINB12, FOLATE, FERRITIN, TIBC, IRON, RETICCTPCT in the last 72 hours. Urine analysis:    Component Value Date/Time   COLORURINE YELLOW (A) 04/21/2020 1747   APPEARANCEUR CLOUDY (A) 04/21/2020 1747   APPEARANCEUR Clear 12/14/2019 1040   LABSPEC 1.011 04/21/2020 1747   LABSPEC 1.003 01/09/2012 2126   PHURINE 5.0 04/21/2020 1747   GLUCOSEU NEGATIVE 04/21/2020 1747   GLUCOSEU Negative 01/09/2012 2126   HGBUR NEGATIVE 04/21/2020 [place] 04/21/2020 1747   BILIRUBINUR Negative 12/14/2019 1040   BILIRUBINUR Negative 01/09/2012 2126   KETONESUR NEGATIVE 04/21/2020 1747   PROTEINUR NEGATIVE 04/21/2020 1747   NITRITE POSITIVE (A) 04/21/2020 1747   LEUKOCYTESUR LARGE (A) 04/21/2020 1747   LEUKOCYTESUR Negative 01/09/2012 2126    Radiological Exams on Admission: CT ABDOMEN PELVIS W CONTRAST  Result Date: 04/21/2020 CLINICAL DATA:  Diarrhea for 1 month. EXAM: CT ABDOMEN AND PELVIS WITH CONTRAST TECHNIQUE: Multidetector CT imaging of the abdomen and pelvis was performed using the standard protocol following bolus administration of intravenous contrast. CONTRAST:  66m OMNIPAQUE IOHEXOL 300 MG/ML  SOLN COMPARISON:  June 19, 2019 FINDINGS: Lower chest: There are stable small pulmonary nodules at the lung bases measuring up to approximately 5 mm. No further follow-up is recommended.The heart size is normal. Hepatobiliary: The liver is normal.  Status post cholecystectomy.There is mild intrahepatic and extrahepatic biliary ductal dilatation. Pancreas: There appear to be a cluster cysts at the pancreatic head, similar to prior study. Spleen: Unremarkable. Adrenals/Urinary Tract: --Adrenal glands: Unremarkable. --Right kidney/ureter: No hydronephrosis or radiopaque kidney stones. --Left kidney/ureter: No hydronephrosis or radiopaque kidney stones. --Urinary bladder: There is a punctate focus of gas within the urinary bladder. 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 --Small bowel: Unremarkable. --Colon: There is diffuse circumferential wall thickening of virtually all of the colon, most notably at the level of the cecum and ascending colon. --Appendix: Normal. Vascular/Lymphatic: Atherosclerotic calcification is present within the non-aneurysmal abdominal aorta, without hemodynamically significant stenosis. --No retroperitoneal lymphadenopathy. --there are few mildly enlarged mesenteric lymph nodes, most notably in the right lower quadrant. These are presumably reactive. --No pelvic or inguinal lymphadenopathy. Reproductive: Status post hysterectomy. No adnexal mass. Other: No ascites or free air. The abdominal wall is normal. Musculoskeletal. No acute displaced fractures. IMPRESSION: 1. Diffuse circumferential wall thickening of virtually all of the colon, most notably at the level of the cecum and ascending colon, consistent with infectious or inflammatory colitis. 2. There is a punctate focus of gas within the urinary bladder. Correlate for history of recent instrumentation. Aortic Atherosclerosis (ICD10-I70.0). Electronically Signed   By: CConstance HolsterM.D.   On: 04/21/2020 18:04      Assessment/Plan  Severe sepsis secondary to pancolitis Pt tachycardic, has leukocytosis and elevated lactate C. difficile and GI panel testing pending.  High risk of C. difficile  infection given patient is normally on prophylactic antibiotics for her chronic UTI, has fever, persistent diarrhea and significant abdominal tenderness on exam. Will treat prophylactically with oral vancomycin pending C. difficile results.  Also will continue treatment with IV Zosyn for colitis  should this not be C. difficile related. Continue to trend lactate Has received a bolus of IV normal saline fluids in the ED.  Continuous IV fluids  Chronic UTI with ?acute UTI Recently was on Keflex but discontinued 3 weeks ago. Currently has asymptomatic bacteriuria.  Urine culture pending  Hypotension secondary to sepsis and hypovolemia continuous IV fluids Hold antihypertensives   Hyponatremia Due to hypovolemia.  Check every 4 on IV continuous fluid to avoid rapid correction.  Hypokalemia replete with IV potassium  AKI Pre-renal from hypovolemia Continuous IV fluid Avoid nephrotoxic agent  Microcytic anemia Has hx of gastric bypass will check for vitamin deficiency and FOBT  Insulin-dependent type 2 diabetes HbA1C of 7.7 in september  At home she is on glargine 24 units,glipizide, metformin start with 10 units Levermir qHS and sensitive SSI while on clear liquid diet   OCD Continue bupropion, Risperdal, Lamictal, Zoloft  Restless leg syndrome Continue Pramipexole   DVT prophylaxis:.Lovenox Code Status: Full Family Communication: Plan discussed with patient at bedside  disposition Plan: Home with at least 2 midnight stays  Consults called:  Admission status: inpatient Status is: Inpatient  Remains inpatient appropriate because:Inpatient level of care appropriate due to severity of illness   Dispo: The patient is from: Home              Anticipated d/c is to: Home              Anticipated d/c date is: 3 days              Patient currently is not medically stable to d/c.         Orene Desanctis DO [facility]   If 7PM-7AM, please contact  night-coverage [email or web address]   04/21/2020, 8:56 PM

## 2020-04-22 LAB — GASTROINTESTINAL PANEL BY PCR, STOOL (REPLACES STOOL CULTURE)

## 2020-04-22 LAB — MAGNESIUM: Magnesium: 1.7 mg/dL (ref 1.7–2.4)

## 2020-04-22 LAB — BASIC METABOLIC PANEL
Anion gap: 9 (ref 5–15)
BUN/Creatinine Ratio: 10 — ABNORMAL LOW (ref 12–28)
BUN: 12 mg/dL (ref 8–27)
BUN: 5 mg/dL — ABNORMAL LOW (ref 8–23)
CO2: 18 mmol/L — ABNORMAL LOW (ref 20–29)
CO2: 26 mmol/L (ref 22–32)
Calcium: 8.3 mg/dL — ABNORMAL LOW (ref 8.7–10.3)
Calcium: 7.7 mg/dL — ABNORMAL LOW (ref 8.9–10.3)
Chloride: 91 mmol/L — ABNORMAL LOW (ref 96–106)
Chloride: 99 mmol/L (ref 98–111)
Creatinine, Ser: 1.24 mg/dL — ABNORMAL HIGH (ref 0.57–1.00)
Creatinine, Ser: 0.57 mg/dL (ref 0.44–1.00)
GFR calc Af Amer: 54 mL/min/{1.73_m2} — ABNORMAL LOW (ref >59)
GFR calc Af Amer: >60 mL/min (ref >60)
GFR calc non Af Amer: 47 mL/min/{1.73_m2} — ABNORMAL LOW (ref >59)
GFR calc non Af Amer: >60 mL/min (ref >60)
Glucose, Bld: 68 mg/dL — ABNORMAL LOW (ref 70–99)
Glucose: 244 mg/dL — ABNORMAL HIGH (ref 65–99)
Potassium: 3.3 mmol/L — ABNORMAL LOW (ref 3.5–5.2)
Potassium: 2.6 mmol/L — CL (ref 3.5–5.1)
Sodium: 129 mmol/L — ABNORMAL LOW (ref 134–144)
Sodium: 134 mmol/L — ABNORMAL LOW (ref 135–145)

## 2020-04-22 LAB — CBC WITH DIFFERENTIAL/PLATELET
Basophils Absolute: 0.1 10*3/uL (ref 0.0–0.2)
Basos: 0 %
EOS (ABSOLUTE): 0 10*3/uL (ref 0.0–0.4)
Eos: 0 %
Hematocrit: 27.7 % — ABNORMAL LOW (ref 34.0–46.6)
Hemoglobin: 8.2 g/dL — ABNORMAL LOW (ref 11.1–15.9)
Immature Grans (Abs): 0.1 10*3/uL (ref 0.0–0.1)
Immature Granulocytes: 1 %
Lymphocytes Absolute: 1.2 10*3/uL (ref 0.7–3.1)
Lymphs: 7 %
MCH: 19.5 pg — ABNORMAL LOW (ref 26.6–33.0)
MCHC: 29.6 g/dL — ABNORMAL LOW (ref 31.5–35.7)
MCV: 66 fL — ABNORMAL LOW (ref 79–97)
Monocytes Absolute: 0.9 10*3/uL (ref 0.1–0.9)
Monocytes: 5 %
Neutrophils Absolute: 15.5 10*3/uL — ABNORMAL HIGH (ref 1.4–7.0)
Neutrophils: 87 %
Platelets: 567 10*3/uL — ABNORMAL HIGH (ref 150–450)
RBC: 4.2 x10E6/uL (ref 3.77–5.28)
RDW: 17.7 % — ABNORMAL HIGH (ref 11.7–15.4)
WBC: 17.8 10*3/uL — ABNORMAL HIGH (ref 3.4–10.8)

## 2020-04-22 LAB — IRON AND TIBC
Iron: 7 ug/dL — ABNORMAL LOW (ref 28–170)
Saturation Ratios: 3 % — ABNORMAL LOW (ref 10.4–31.8)
TIBC: 204 ug/dL — ABNORMAL LOW (ref 250–450)
UIBC: 197 ug/dL

## 2020-04-22 LAB — CBC
HCT: 23.5 % — ABNORMAL LOW (ref 36.0–46.0)
HCT: 23 % — ABNORMAL LOW (ref 36.0–46.0)
Hemoglobin: 7.3 g/dL — ABNORMAL LOW (ref 12.0–15.0)
Hemoglobin: 7.1 g/dL — ABNORMAL LOW (ref 12.0–15.0)
MCH: 19.9 pg — ABNORMAL LOW (ref 26.0–34.0)
MCH: 19.6 pg — ABNORMAL LOW (ref 26.0–34.0)
MCHC: 31.1 g/dL (ref 30.0–36.0)
MCHC: 30.9 g/dL (ref 30.0–36.0)
MCV: 64 fL — ABNORMAL LOW (ref 80.0–100.0)
MCV: 63.4 fL — ABNORMAL LOW (ref 80.0–100.0)
Platelets: 668 10*3/uL — ABNORMAL HIGH (ref 150–400)
Platelets: 676 10*3/uL — ABNORMAL HIGH (ref 150–400)
RBC: 3.67 MIL/uL — ABNORMAL LOW (ref 3.87–5.11)
RBC: 3.63 MIL/uL — ABNORMAL LOW (ref 3.87–5.11)
RDW: 18.9 % — ABNORMAL HIGH (ref 11.5–15.5)
RDW: 18.7 % — ABNORMAL HIGH (ref 11.5–15.5)
WBC: 12.2 10*3/uL — ABNORMAL HIGH (ref 4.0–10.5)
WBC: 11.6 10*3/uL — ABNORMAL HIGH (ref 4.0–10.5)
nRBC: 0 % (ref 0.0–0.2)
nRBC: 0 % (ref 0.0–0.2)

## 2020-04-22 LAB — VITAMIN B12: Vitamin B-12: 346 pg/mL (ref 180–914)

## 2020-04-22 LAB — C DIFFICILE QUICK SCREEN W PCR REFLEX
C Diff antigen: NEGATIVE
C Diff interpretation: NOT DETECTED
C Diff toxin: NEGATIVE

## 2020-04-22 LAB — HIV ANTIBODY (ROUTINE TESTING W REFLEX): HIV Screen 4th Generation wRfx: NONREACTIVE

## 2020-04-22 LAB — GLUCOSE, CAPILLARY
Glucose-Capillary: 61 mg/dL — ABNORMAL LOW (ref 70–99)
Glucose-Capillary: 94 mg/dL (ref 70–99)
Glucose-Capillary: 76 mg/dL (ref 70–99)
Glucose-Capillary: 159 mg/dL — ABNORMAL HIGH (ref 70–99)
Glucose-Capillary: 106 mg/dL — ABNORMAL HIGH (ref 70–99)

## 2020-04-22 LAB — SODIUM
Sodium: 135 mmol/L (ref 135–145)
Sodium: 134 mmol/L — ABNORMAL LOW (ref 135–145)

## 2020-04-22 LAB — OCCULT BLOOD X 1 CARD TO LAB, STOOL: Fecal Occult Bld: POSITIVE — AB

## 2020-04-22 LAB — FOLATE: Folate: 10.1 ng/mL (ref >5.9)

## 2020-04-22 MED ORDER — ACETAMINOPHEN 325 MG PO TABS
650.0000 mg | ORAL_TABLET | Freq: Four times a day (QID) | ORAL | Status: DC | PRN
  Administered 2020-04-22: 650 mg via ORAL
  Filled 2020-04-22: qty 2

## 2020-04-22 MED ORDER — POTASSIUM CHLORIDE 10 MEQ/100ML IV SOLN
10.0000 meq | INTRAVENOUS | Status: AC
  Administered 2020-04-22 (×2): 10 meq via INTRAVENOUS
  Filled 2020-04-22: qty 100

## 2020-04-22 MED ORDER — POTASSIUM CHLORIDE 10 MEQ/100ML IV SOLN
10.0000 meq | INTRAVENOUS | Status: DC
  Administered 2020-04-22 (×3): 10 meq via INTRAVENOUS
  Filled 2020-04-22 (×3): qty 100

## 2020-04-22 MED ORDER — POTASSIUM CHLORIDE CRYS ER 20 MEQ PO TBCR
40.0000 meq | EXTENDED_RELEASE_TABLET | Freq: Once | ORAL | Status: AC
  Administered 2020-04-22: 40 meq via ORAL
  Filled 2020-04-22: qty 2

## 2020-04-22 MED ORDER — SODIUM CHLORIDE 0.9 % IV SOLN
510.0000 mg | Freq: Once | INTRAVENOUS | Status: AC
  Administered 2020-04-22: 510 mg via INTRAVENOUS
  Filled 2020-04-22: qty 17

## 2020-04-22 MED ORDER — ONDANSETRON HCL 4 MG/2ML IJ SOLN
4.0000 mg | Freq: Four times a day (QID) | INTRAMUSCULAR | Status: DC | PRN
  Administered 2020-04-22: 4 mg via INTRAVENOUS
  Filled 2020-04-22: qty 2

## 2020-04-22 MED ORDER — MAGNESIUM SULFATE IN D5W 1-5 GM/100ML-% IV SOLN
1.0000 g | Freq: Once | INTRAVENOUS | Status: AC
  Administered 2020-04-22: 1 g via INTRAVENOUS
  Filled 2020-04-22: qty 100

## 2020-04-22 MED ORDER — LOPERAMIDE HCL 2 MG PO CAPS
2.0000 mg | ORAL_CAPSULE | ORAL | Status: DC | PRN
  Administered 2020-04-24: 2 mg via ORAL
  Filled 2020-04-22: qty 1

## 2020-04-22 MED ORDER — INSULIN DETEMIR 100 UNIT/ML ~~LOC~~ SOLN
7.0000 [IU] | Freq: Every day | SUBCUTANEOUS | Status: DC
  Filled 2020-04-22 (×3): qty 0.07

## 2020-04-22 MED ORDER — SODIUM CHLORIDE 0.9 % IV SOLN
INTRAVENOUS | Status: DC | PRN
  Administered 2020-04-22: 250 mL via INTRAVENOUS

## 2020-04-22 NOTE — ED Notes (Signed)
Attempted to call floor for pt's room assignment. Per Secretary they are not taking anymore pt's at this time due to staff

## 2020-04-22 NOTE — ED Notes (Signed)
Pt provide with OJ at this time. Pt requesting tylenol and nausea medication. Admit MD messaged regarding pt's requests. Will wait for orders

## 2020-04-22 NOTE — ED Notes (Signed)
Attempted to call Admit MD to inform of pt's critical K+ result. Unable to get an answer at this time. Will continue to try

## 2020-04-22 NOTE — Progress Notes (Addendum)
   04/22/20 1945  Clinical Encounter Type  Visited With Patient and family together  Visit Type Initial  Referral From Chaplain  Consult/Referral To [place] responded to OR for an AD. Chaplain took form to room. Chaplain explained 2 witnesses and a notary are needed to complete the form. When pt is ready to complete form chaplains will orchestrate getting form completed.

## 2020-04-22 NOTE — Progress Notes (Signed)
Inpatient Diabetes Program Recommendations  AACE/ADA: New Consensus Statement on Inpatient Glycemic Control (2015)  Target Ranges:  Prepandial:   less than 140 mg/dL      Peak postprandial:   less than 180 mg/dL (1-2 hours)      Critically ill patients:  140 - 180 mg/dL   Results for Amy Hansen, Amy Hansen (MRN [id number]) as of 04/22/2020 10:33  Ref. Range 04/21/2020 22:05 04/22/2020 08:29 04/22/2020 09:18  Glucose-Capillary Latest Ref Range: 70 - 99 mg/dL 145 (H)  10 units LEVEMIR 61 (L) 94    Admit with: Severe sepsis secondary to pancolitis  History: DM  Home DM Meds: Glipizide 5 mg BID       Toujeo 24 units Daily       Metformin 1000 mg BID  Current Orders: Levemir 10 units QHS      Novolog Sensitive Correction Scale/ SSI (0-9 units) TID AC + HS     MD- Note patient with mild Hypoglycemia this AM after getting 10 units Levemir last PM  Please consider reducing the Levemir to 7 units QHS for tonight     --Will follow patient during hospitalization--  Wyn Quaker RN, MSN, CDE Diabetes Coordinator Inpatient Glycemic Control Team Team Pager: [phone] (8a-5p)

## 2020-04-22 NOTE — ED Notes (Signed)
Pt third bad of 10 meq potassium started at this time. Pharmacy contacted to retime subsequent doses for every 2 hours due to ml/hr decrease for pt comfort.

## 2020-04-22 NOTE — Progress Notes (Signed)
PROGRESS NOTE    Amy Hansen  GLO:[id number] DOB: 1955-10-07 DOA: 04/21/2020 PCP: Volney American, PA-C   Brief Narrative: Taken from H&P. Amy Hansen is a 64 y.o. female with medical history significant for chronic UTI on chronic prophylactic Keflex, hypertension, migraine, insulin-dependent type 2 diabetes, chronic diastolic heart failure, GERD, restless leg syndrome, OCD who presents with persistent diarrhea and weakness.  Patient reports that she started having diarrhea a month ago that has been persistent.  Has about 5 episodes a day.  Denies any bright red blood per rectum or dark stools but does note mucus.  Patient did endorse some subjective fever but she was afebrile on admission.  Denies any recent travels.  Has a prior history of diverticulitis requiring hospitalization. Has an history of chronic UTI and was taking Keflex for the past whole year, recently stopped about 3 weeks ago.  C. difficile colitis and GI pathogen was negative.  He followed up with urology as an outpatient.  But denies any urinary symptoms.  UA looked infected, may be colonization.  FOBT was positive but that can be secondary to colitis.  CT abdomen was concerning of colitis.  She was started on Zosyn.  Subjective: Patient continued to have some nausea, vomiting seems improving.  Continues to have some belly pain.  She had 2 incidents of diarrhea since this morning.  She is having 4-5 loose bowel movements daily for the past 1 month.  Has not seen a GI yet.  Assessment & Plan:   Active Problems:   Recurrent UTI   Restless legs syndrome (RLS)   Poorly controlled type 2 diabetes mellitus (HCC)   OCD (obsessive compulsive disorder)   Colitis   Severe sepsis (HCC)   AKI (acute kidney injury) ([place])   Hypokalemia   Hyponatremia   Anemia  Severe sepsis secondary to pancolitis.  Initially meeting sepsis criteria with tachycardia, tachypnea, leukocytosis and elevated lactic acid.  C. difficile and  GI pathogen negative.  Patient is having chronic diarrhea. She was started on Zosyn for pancolitis. She was also started on p.o. vancomycin while waiting for C. difficile results as she was high risk after taking antibiotics for a year-p.o. vancomycin was discontinued when C. difficile came back negative.  Lactic acidosis improving. -Continue supportive care. -Continue Zosyn. -Stool cultures.  Chronic UTI.  UA with nitrites and pyuria.  Most likely a colonization as she is not having any urinary symptoms at this time. -Urine culture pending.  Electrolyte abnormalities.  Most likely secondary to GI losses.  Patient has hyponatremia which is improving.  Hypokalemia with borderline magnesium which are being repleted. -Continue to monitor and replete as needed.  Microcytic anemia.  Patient has an history of gastric bypass.  Iron studies with low iron and saturation along with low TIBC more consistent with iron deficiency with anemia of chronic disease.  FOBT positive but patient also has active colitis.  B12 and folate within normal limit. -Give her 1 dose of Feraheme. -Patient needs outpatient GI evaluation for chronic diarrhea and positive FOBT once recovered from acute colitis.  Insulin-dependent type 2 diabetes.  A1c of 7.7 in September. -Continue sliding scale with Levemir.  Hypertension.  Blood pressure within goal today. Home antihypertensives were held due to softer blood pressure on admission. -Continue to hold home antihypertensives-can be restarted if blood pressure started going up.  OCD Continue bupropion, Risperdal, Lamictal, Zoloft  Restless leg syndrome Continue Pramipexole.  Objective: Vitals:   04/22/20 0840 04/22/20 0841 04/22/20  1135 04/22/20 1418  BP:  (!) 113/91 (!) 115/95 111/67  Pulse:  85 84 80  Resp:  17 18 16   Temp:      TempSrc:      SpO2:  97% 99% 99%  Weight: 56.4 kg     Height: 5' 2"  (1.575 m)       Intake/Output Summary (Last 24 hours) at  04/22/2020 1609 Last data filed at 04/22/2020 1538 Gross per 24 hour  Intake 2490 ml  Output 1200 ml  Net 1290 ml   Filed Weights   04/21/20 1508 04/22/20 0840  Weight: 56.4 kg 56.4 kg    Examination:  General exam: Appears calm and comfortable  Respiratory system: Clear to auscultation. Respiratory effort normal. Cardiovascular system: S1 & S2 heard, RRR. No JVD, murmurs, rubs, gallops or clicks. Gastrointestinal system: Soft, mild periumbilical tenderness, nondistended, bowel sounds positive. Central nervous system: Alert and oriented. No focal neurological deficits.Symmetric 5 x 5 power. Extremities: No edema, no cyanosis, pulses intact and symmetrical. Psychiatry: Judgement and insight appear normal. Mood & affect appropriate.    DVT prophylaxis: SCDs Code Status: Full Family Communication: Husband was updated at bedside. Disposition Plan:  Status is: Inpatient  Remains inpatient appropriate because:Inpatient level of care appropriate due to severity of illness   Dispo: The patient is from: Home              Anticipated d/c is to: Home              Anticipated d/c date is: 1 day              Patient currently is not medically stable to d/c.  Consultants:   None  Procedures:  Antimicrobials:  Zosyn  Data Reviewed: I have personally reviewed following labs and imaging studies  CBC: Recent Labs  Lab 04/21/20 1503 04/21/20 1523 04/22/20 0515 04/22/20 0833  WBC 17.8* 19.5* 12.2* 11.6*  NEUTROABS 15.5*  --   --   --   HGB 8.2* 9.5* 7.3* 7.1*  HCT 27.7* 29.6* 23.5* 23.0*  MCV 66* 62.1* 64.0* 63.4*  PLT 567* 583* 668* 413*   Basic Metabolic Panel: Recent Labs  Lab 04/21/20 1406 04/21/20 1523 04/21/20 2114 04/22/20 0515 04/22/20 0833 04/22/20 1416  NA 129* 125* 131* 135 134* 134*  K 3.3* 2.7*  --   --  2.6*  --   CL 91* 90*  --   --  99  --   CO2 18* 20*  --   --  26  --   GLUCOSE 244* 219*  --   --  68*  --   BUN 12 14  --   --  5*  --    CREATININE 1.24* 1.39*  --   --  0.57  --   CALCIUM 8.3* 8.1*  --   --  7.7*  --   MG  --   --   --  1.7  --   --    GFR: Estimated Creatinine Clearance: 57.7 mL/min (by C-G formula based on SCr of 0.57 mg/dL). Liver Function Tests: Recent Labs  Lab 04/21/20 1523  AST 21  ALT 14  ALKPHOS 165*  BILITOT 0.5  PROT 6.7  ALBUMIN 2.4*   Recent Labs  Lab 04/21/20 1523  LIPASE 18   No results for input(s): AMMONIA in the last 168 hours. Coagulation Profile: No results for input(s): INR, PROTIME in the last 168 hours. Cardiac Enzymes: No results for input(s): CKTOTAL, CKMB, CKMBINDEX,  TROPONINI in the last 168 hours. BNP (last 3 results) No results for input(s): PROBNP in the last 8760 hours. HbA1C: Recent Labs    04/21/20 1350  HGBA1C 7.7*   CBG: Recent Labs  Lab 04/21/20 2205 04/22/20 0829 04/22/20 0918 04/22/20 1140 04/22/20 1420  GLUCAP 145* 61* 94 76 159*   Lipid Profile: No results for input(s): CHOL, HDL, LDLCALC, TRIG, CHOLHDL, LDLDIRECT in the last 72 hours. Thyroid Function Tests: No results for input(s): TSH, T4TOTAL, FREET4, T3FREE, THYROIDAB in the last 72 hours. Anemia Panel: Recent Labs    04/22/20 0515  VITAMINB12 346  FOLATE 10.1  TIBC 204*  IRON 7*   Sepsis Labs: Recent Labs  Lab 04/21/20 1642 04/21/20 2114  LATICACIDVEN 3.9* 2.0*    Recent Results (from the past 240 hour(s))  Respiratory Panel by RT PCR (Flu A&B, Covid) - Nasopharyngeal Swab     Status: None   Collection Time: 04/21/20  5:47 PM   Specimen: Nasopharyngeal Swab  Result Value Ref Range Status   SARS Coronavirus 2 by RT PCR NEGATIVE NEGATIVE Final    Comment: (NOTE) SARS-CoV-2 target nucleic acids are NOT DETECTED.  The SARS-CoV-2 RNA is generally detectable in upper respiratoy specimens during the acute phase of infection. The lowest concentration of SARS-CoV-2 viral copies this assay can detect is 131 copies/mL. A negative result does not preclude  SARS-Cov-2 infection and should not be used as the sole basis for treatment or other patient management decisions. A negative result may occur with  improper specimen collection/handling, submission of specimen other than nasopharyngeal swab, presence of viral mutation(s) within the areas targeted by this assay, and inadequate number of viral copies (<131 copies/mL). A negative result must be combined with clinical observations, patient history, and epidemiological information. The expected result is Negative.  Fact Sheet for Patients:  PinkCheek.be  Fact Sheet for Healthcare Providers:  GravelBags.it  This test is no t yet approved or cleared by the [place] FDA and  has been authorized for detection and/or diagnosis of SARS-CoV-2 by FDA under an Emergency Use Authorization (EUA). This EUA will remain  in effect (meaning this test can be used) for the duration of the COVID-19 declaration under Section 564(b)(1) of the Act, 21 U.S.C. section 360bbb-3(b)(1), unless the authorization is terminated or revoked sooner.     Influenza A by PCR NEGATIVE NEGATIVE Final   Influenza B by PCR NEGATIVE NEGATIVE Final    Comment: (NOTE) The Xpert Xpress SARS-CoV-2/FLU/RSV assay is intended as an aid in  the diagnosis of influenza from Nasopharyngeal swab specimens and  should not be used as a sole basis for treatment. Nasal washings and  aspirates are unacceptable for Xpert Xpress SARS-CoV-2/FLU/RSV  testing.  Fact Sheet for Patients: PinkCheek.be  Fact Sheet for Healthcare Providers: GravelBags.it  This test is not yet approved or cleared by the [place] FDA and  has been authorized for detection and/or diagnosis of SARS-CoV-2 by  FDA under an Emergency Use Authorization (EUA). This EUA will remain  in effect (meaning this test can be used) for the duration of the   Covid-19 declaration under Section 564(b)(1) of the Act, 21  U.S.C. section 360bbb-3(b)(1), unless the authorization is  terminated or revoked. Performed at [facility], [address] [place], [place] [address]   Gastrointestinal Panel by PCR , Stool     Status: None   Collection Time: 04/22/20  2:07 AM   Specimen: Stool  Result Value Ref Range Status  Campylobacter species NOT DETECTED NOT DETECTED Final   Plesimonas shigelloides NOT DETECTED NOT DETECTED Final   Salmonella species NOT DETECTED NOT DETECTED Final   Yersinia enterocolitica NOT DETECTED NOT DETECTED Final   Vibrio species NOT DETECTED NOT DETECTED Final   Vibrio cholerae NOT DETECTED NOT DETECTED Final   Enteroaggregative E coli (EAEC) NOT DETECTED NOT DETECTED Final   Enteropathogenic E coli (EPEC) NOT DETECTED NOT DETECTED Final   Enterotoxigenic E coli (ETEC) NOT DETECTED NOT DETECTED Final   Shiga like toxin producing E coli (STEC) NOT DETECTED NOT DETECTED Final   Shigella/Enteroinvasive E coli (EIEC) NOT DETECTED NOT DETECTED Final   Cryptosporidium NOT DETECTED NOT DETECTED Final   Cyclospora cayetanensis NOT DETECTED NOT DETECTED Final   Entamoeba histolytica NOT DETECTED NOT DETECTED Final   Giardia lamblia NOT DETECTED NOT DETECTED Final   Adenovirus F40/41 NOT DETECTED NOT DETECTED Final   Astrovirus NOT DETECTED NOT DETECTED Final   Norovirus GI/GII NOT DETECTED NOT DETECTED Final   Rotavirus A NOT DETECTED NOT DETECTED Final   Sapovirus (I, II, IV, and V) NOT DETECTED NOT DETECTED Final    Comment: Performed at [facility], [address] [place], [place] [address]  C Difficile Quick Screen w PCR reflex     Status: None   Collection Time: 04/22/20  2:07 AM   Specimen: STOOL  Result Value Ref Range Status   C Diff antigen NEGATIVE NEGATIVE Final   C Diff toxin NEGATIVE NEGATIVE Final   C Diff interpretation No C. difficile detected.  Final    Comment: Performed at [facility], [place] [address] [place], [place] [address]     Radiology Studies: CT ABDOMEN PELVIS W CONTRAST  Result Date: 04/21/2020 CLINICAL DATA:  Diarrhea for 1 month. EXAM: CT ABDOMEN AND PELVIS WITH CONTRAST TECHNIQUE: Multidetector CT imaging of the abdomen and pelvis was performed using the standard protocol following bolus administration of intravenous contrast. CONTRAST:  19m OMNIPAQUE IOHEXOL 300 MG/ML  SOLN COMPARISON:  June 19, 2019 FINDINGS: Lower chest: There are stable small pulmonary nodules at the lung bases measuring up to approximately 5 mm. No further follow-up is recommended.The heart size is normal. Hepatobiliary: The liver is normal. Status post cholecystectomy.There is mild intrahepatic and extrahepatic biliary ductal dilatation. Pancreas: There appear to be a cluster cysts at the pancreatic head, similar to prior study. Spleen: Unremarkable. Adrenals/Urinary Tract: --Adrenal glands: Unremarkable. --Right kidney/ureter: No hydronephrosis or radiopaque kidney stones. --Left kidney/ureter: No hydronephrosis or radiopaque kidney stones. --Urinary bladder: There is a punctate focus of gas within the urinary bladder. 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 --Small bowel: Unremarkable. --Colon: There is diffuse circumferential wall thickening of virtually all of the colon, most notably at the level of the cecum and ascending colon. --Appendix: Normal. Vascular/Lymphatic: Atherosclerotic calcification is present within the non-aneurysmal abdominal aorta, without hemodynamically significant stenosis. --No retroperitoneal lymphadenopathy. --there are few mildly enlarged mesenteric lymph nodes, most notably in the right lower quadrant. These are presumably reactive. --No pelvic or inguinal lymphadenopathy. Reproductive: Status post hysterectomy. No adnexal mass. Other: No ascites or free air. The abdominal wall is  normal. Musculoskeletal. No acute displaced fractures. IMPRESSION: 1. Diffuse circumferential wall thickening of virtually all of the colon, most notably at the level of the cecum and ascending colon, consistent with infectious or inflammatory colitis. 2. There is a punctate focus of gas within the urinary bladder. Correlate for history of recent  instrumentation. Aortic Atherosclerosis (ICD10-I70.0). Electronically Signed   By: Constance Holster M.D.   On: 04/21/2020 18:04    Scheduled Meds: . buPROPion  450 mg Oral Daily  . estradiol  1 Applicatorful Vaginal Once per day on Mon Wed Fri  . insulin aspart  0-5 Units Subcutaneous QHS  . insulin aspart  0-9 Units Subcutaneous TID WC  . insulin detemir  10 Units Subcutaneous QHS  . lamoTRIgine  400 mg Oral QHS  . pantoprazole (PROTONIX) IV  40 mg Intravenous QHS  . pramipexole  1 mg Oral QHS  . risperiDONE  3 mg Oral QHS  . sertraline  150 mg Oral Daily   Continuous Infusions: . ferumoxytol    . piperacillin-tazobactam (ZOSYN)  IV 3.375 g (04/22/20 1417)  . potassium chloride       LOS: 1 day   Time spent: 35 minutes.  Lorella Nimrod, MD [facility]  If 7PM-7AM, please contact night-coverage [email or web address]  04/22/2020, 4:09 PM   This record has been created using Systems analyst. Errors have been sought and corrected,but may not always be located. Such creation errors do not reflect on the standard of care.

## 2020-04-22 NOTE — ED Notes (Signed)
Pt given more OJ at this time.

## 2020-04-23 DIAGNOSIS — D509 Iron deficiency anemia, unspecified: Secondary | ICD-10-CM

## 2020-04-23 DIAGNOSIS — K529 Noninfective gastroenteritis and colitis, unspecified: Secondary | ICD-10-CM

## 2020-04-23 LAB — CBC
HCT: 21.9 % — ABNORMAL LOW (ref 36.0–46.0)
Hemoglobin: 6.9 g/dL — ABNORMAL LOW (ref 12.0–15.0)
MCH: 20 pg — ABNORMAL LOW (ref 26.0–34.0)
MCHC: 31.5 g/dL (ref 30.0–36.0)
MCV: 63.5 fL — ABNORMAL LOW (ref 80.0–100.0)
Platelets: 639 10*3/uL — ABNORMAL HIGH (ref 150–400)
RBC: 3.45 MIL/uL — ABNORMAL LOW (ref 3.87–5.11)
RDW: 18.9 % — ABNORMAL HIGH (ref 11.5–15.5)
WBC: 7.2 10*3/uL (ref 4.0–10.5)
nRBC: 0 % (ref 0.0–0.2)

## 2020-04-23 LAB — LACTIC ACID, PLASMA: Lactic Acid, Venous: 2.7 mmol/L (ref 0.5–1.9)

## 2020-04-23 LAB — BASIC METABOLIC PANEL
Anion gap: 8 (ref 5–15)
BUN: <5 mg/dL — ABNORMAL LOW (ref 8–23)
CO2: 26 mmol/L (ref 22–32)
Calcium: 8 mg/dL — ABNORMAL LOW (ref 8.9–10.3)
Chloride: 99 mmol/L (ref 98–111)
Creatinine, Ser: 0.47 mg/dL (ref 0.44–1.00)
GFR calc Af Amer: >60 mL/min (ref >60)
GFR calc non Af Amer: >60 mL/min (ref >60)
Glucose, Bld: 137 mg/dL — ABNORMAL HIGH (ref 70–99)
Potassium: 3.7 mmol/L (ref 3.5–5.1)
Sodium: 133 mmol/L — ABNORMAL LOW (ref 135–145)

## 2020-04-23 LAB — GLUCOSE, CAPILLARY
Glucose-Capillary: 154 mg/dL — ABNORMAL HIGH (ref 70–99)
Glucose-Capillary: 130 mg/dL — ABNORMAL HIGH (ref 70–99)
Glucose-Capillary: 127 mg/dL — ABNORMAL HIGH (ref 70–99)
Glucose-Capillary: 145 mg/dL — ABNORMAL HIGH (ref 70–99)

## 2020-04-23 LAB — PREPARE RBC (CROSSMATCH)

## 2020-04-23 LAB — ABO/RH: ABO/RH(D): O POS

## 2020-04-23 LAB — MAGNESIUM: Magnesium: 1.7 mg/dL (ref 1.7–2.4)

## 2020-04-23 MED ORDER — ADULT MULTIVITAMIN W/MINERALS CH
1.0000 | ORAL_TABLET | Freq: Every day | ORAL | Status: DC
  Administered 2020-04-23: 1 via ORAL
  Filled 2020-04-23 (×2): qty 1

## 2020-04-23 MED ORDER — BOOST / RESOURCE BREEZE PO LIQD CUSTOM
1.0000 | Freq: Two times a day (BID) | ORAL | Status: DC
  Administered 2020-04-24: 1 via ORAL

## 2020-04-23 MED ORDER — SODIUM CHLORIDE 0.9% IV SOLUTION: Freq: Once | INTRAVENOUS | Status: AC

## 2020-04-23 NOTE — Progress Notes (Signed)
Initial Nutrition Assessment  DOCUMENTATION CODES:   Not applicable  INTERVENTION:  Boost Breeze po BID, each supplement provides 250 kcal and 9 grams of protein  MVI with minerals daily  Pt is at risk for refeeding given persistent diarrhea and reported poor po intake, recommend monitoring K, Mg, and P daily as po intake improves.   NUTRITION DIAGNOSIS:   Inadequate oral intake related to decreased appetite, diarrhea as evidenced by per patient/family report.    GOAL:   Patient will meet greater than or equal to 90% of their needs    MONITOR:   Labs, I & O's, Supplement acceptance, PO intake, Weight trends  REASON FOR ASSESSMENT:   Malnutrition Screening Tool    ASSESSMENT:  RD working remotely.  65 year old female with history of chronic UTI, HTN, migraine, IDDM2, chronic dCHF, s/p gastric bypass, GERD, RLS, OCD presents with persistent diarrhea and weakness over the past month.  Pt admitted with severe sepsis secondary to pancolitis.  Spoke with pt via phone this afternoon. She reports feeling some better today, still having multiple bowel movements however decreasing in frequency and stools are more formed. Diet advanced to CM, she has ordered vegetable soup with crackers for dinner. Pt endorsed decreased appetite and intake at home over the past couple of months, states that nothing has tasted good and limited po d/t frequent bowel movements. RD educated on soft foods and small frequent meals for easier digestion as well as recommended oral supplement to aid with meeting needs. Pt agreeable to Boost Breeze BID. She has history of DM, will monitor CBGs and make changes to supplement if needed.    UBW 146 lbs per pt report, endorses 10 lb wt loss over the last 3 months.  Per chart, weights have trended down ~18 lbs (12.5%) over the past 7 months; significant. Pt reports some intentional wt loss over this time due to dietary modifications.   Per GI notes, colitis  likely NSAID induced.  Recommendations for outpt EGD and colonoscopy once colitis has resolved, start PPI, continue for 8-12 weeks and stop all NSAID use.  Medications reviewed and include: SSI, Levemir 7 units daily, Protonix, Mirapex, Risperdal IVPB: Zosyn  Labs: CBGs 130,154,106,159,76 x 24 hrs, Na 133 (L), BUN <5 L), Hgb 6.9 (L), Hgb 21.9 (L), RBC 3.45 (L) K/Mg - WNL   NUTRITION - FOCUSED PHYSICAL EXAM: Unable to complete at this time, RD working remotely.  Diet Order:   Diet Order            Diet Carb Modified Fluid consistency: Thin; Room service appropriate? Yes  Diet effective now                 EDUCATION NEEDS:   Education needs have been addressed  Skin:  Skin Assessment: Reviewed RN Assessment  Last BM:  9/26-type 6  Height:   Ht Readings from Last 1 Encounters:  04/22/20 5' 2"  (1.575 m)    Weight:   Wt Readings from Last 1 Encounters:  04/22/20 56.4 kg    Ideal Body Weight:  50 kg  BMI:  Body mass index is 22.74 kg/m.  Estimated Nutritional Needs:   Kcal:  1700-1900  Protein:  85-95  Fluid:  >1.6 L/day   Lajuan Lines, RD, LDN Clinical Nutrition After Hours/Weekend Pager # in [place]

## 2020-04-23 NOTE — Progress Notes (Signed)
PROGRESS NOTE    Amy Hansen  YQM:[id number] DOB: [birth year] DOA: 04/21/2020 PCP: Volney American, PA-C   Brief Narrative: Taken from H&P. Amy Hansen is a 64 y.o. female with medical history significant for chronic UTI on chronic prophylactic Keflex, hypertension, migraine, insulin-dependent type 2 diabetes, chronic diastolic heart failure, GERD, restless leg syndrome, OCD who presents with persistent diarrhea and weakness.  Patient reports that she started having diarrhea a month ago that has been persistent.  Has about 5 episodes a day.  Denies any bright red blood per rectum or dark stools but does note mucus.  Patient did endorse some subjective fever but she was afebrile on admission.  Denies any recent travels.  Has a prior history of diverticulitis requiring hospitalization. Has an history of chronic UTI and was taking Keflex for the past whole year, recently stopped about 3 weeks ago.  C. difficile colitis and GI pathogen was negative.  He followed up with urology as an outpatient.  But denies any urinary symptoms.  UA looked infected, may be colonization.  FOBT was positive but that can be secondary to colitis.  CT abdomen was concerning of pan colitis.  She was started on Zosyn. Hemoglobin dropped to 6.9 on 04/23/2020.  No obvious bleeding.  She did received 2 units of PRBC.  GI was also consulted.  They ordered celiac panel and our concern of NSAID induced colitis which can also cause chronic diarrhea and chronic GI bleed as her hemoglobin dropped over the period of few months.  It was 12.5 in May 2021.  They recommend no NSAIDs and PPI for 6 to 8 weeks.  Outpatient EGD and colonoscopy once colitis subsides.  Subjective: Patient was complaining of dizziness and nausea.  Has not seen any blood in his stool.  Denies any melena.  Belly pain is improving.  She was becoming dizzy whenever she was standing up or moving around.  Discussed that hemoglobin is 6.9 today and we will  give her some blood transfusions and she agrees.  Assessment & Plan:   Active Problems:   Recurrent UTI   Restless legs syndrome (RLS)   Poorly controlled type 2 diabetes mellitus (HCC)   OCD (obsessive compulsive disorder)   Colitis   Severe sepsis (HCC)   AKI (acute kidney injury) ([place])   Hypokalemia   Hyponatremia   Anemia  Severe sepsis secondary to pancolitis.  Initially meeting sepsis criteria with tachycardia, tachypnea, leukocytosis and elevated lactic acid.  C. difficile and GI pathogen negative.  Patient is having chronic diarrhea. She was started on Zosyn for pancolitis. She was also started on p.o. vancomycin while waiting for C. difficile results as she was high risk after taking antibiotics for a year-p.o. vancomycin was discontinued when C. difficile came back negative.  Lactic acidosis improving. Apparently patient was using Excedrin daily for headaches.  GI was consulted-appreciate their help.  According to their note chronic NSAID use can cause colitis, chronic GI bleed and diarrhea.  They advised stopping all NSAID and continue with PPI for 6 to 8 weeks followed by outpatient EGD and colonoscopy.  They did order some celiac labs.  And fecal calprotectin. -Continue supportive care. -Continue Zosyn.  Chronic UTI.  UA with nitrites and pyuria.  Most likely a colonization as she is not having any urinary symptoms at this time. -Urine culture-with Enterobacter aeruginosa-pending susceptibility results. Patient has an history of prior cultures positive for Klebsiella aeruginosa.  Electrolyte abnormalities.  Improved today.  Most likely  secondary to GI losses.  Patient has hyponatremia which is improving.  Hypokalemia with borderline magnesium which are being repleted. -Continue to monitor and replete as needed.  Microcytic anemia.  Patient has an history of gastric bypass.  Iron studies with low iron and saturation along with low TIBC more consistent with iron deficiency  with anemia of chronic disease.  FOBT positive but patient also has active colitis.  B12 and folate within normal limit. GI ordered homocystine level and MMA levels. Received 1 dose of Feraheme on 04/22/2020. Hemoglobin dropped to 6.9 today.  Can be dilutional as it was 7.3 on admission.  Patient was complaining of dizziness. -Give her 2 unit of PRBC for symptomatic anemia. -Continue to monitor.  Insulin-dependent type 2 diabetes.  A1c of 7.7 in September. -Continue sliding scale with Levemir.  Hypertension.  Blood pressure within goal today. Home antihypertensives were held due to softer blood pressure on admission. -Continue to hold home antihypertensives-can be restarted if blood pressure started going up.  OCD Continue bupropion, Risperdal, Lamictal, Zoloft  Restless leg syndrome Continue Pramipexole.  Objective: Vitals:   04/23/20 1120 04/23/20 1129 04/23/20 1158 04/23/20 1505  BP:   107/67 116/75  Pulse: 85 85 85 82  Resp:    20  Temp: 98.7 F (37.1 C) 98.7 F (37.1 C) 98.3 F (36.8 C) 98.5 F (36.9 C)  TempSrc: Oral Oral Oral Oral  SpO2: 97% 97% 95% 97%  Weight:      Height:        Intake/Output Summary (Last 24 hours) at 04/23/2020 1541 Last data filed at 04/23/2020 1158 Gross per 24 hour  Intake 319.84 ml  Output 650 ml  Net -330.16 ml   Filed Weights   04/21/20 1508 04/22/20 0840  Weight: 56.4 kg 56.4 kg    Examination:  General.  Well-developed lady, in no acute distress. Pulmonary.  Lungs clear bilaterally, normal respiratory effort. CV.  Regular rate and rhythm, no JVD, rub or murmur. Abdomen.  Soft, nontender, nondistended, BS positive. CNS.  Alert and oriented x3.  No focal neurologic deficit. Extremities.  No edema, no cyanosis, pulses intact and symmetrical. Psychiatry.  Judgment and insight appears normal.  DVT prophylaxis: SCDs Code Status: Full Family Communication: Discussed with patient. Disposition Plan:  Status is:  Inpatient  Remains inpatient appropriate because:Inpatient level of care appropriate due to severity of illness   Dispo: The patient is from: Home              Anticipated d/c is to: Home              Anticipated d/c date is: 1 day              Patient currently is not medically stable to d/c.  Consultants:   GI  Procedures:  Antimicrobials:  Zosyn  Data Reviewed: I have personally reviewed following labs and imaging studies  CBC: Recent Labs  Lab 04/21/20 1503 04/21/20 1523 04/22/20 0515 04/22/20 0833 04/23/20 0603  WBC 17.8* 19.5* 12.2* 11.6* 7.2  NEUTROABS 15.5*  --   --   --   --   HGB 8.2* 9.5* 7.3* 7.1* 6.9*  HCT 27.7* 29.6* 23.5* 23.0* 21.9*  MCV 66* 62.1* 64.0* 63.4* 63.5*  PLT 567* 583* 668* 676* 101*   Basic Metabolic Panel: Recent Labs  Lab 04/21/20 1406 04/21/20 1523 04/21/20 1523 04/21/20 2114 04/22/20 0515 04/22/20 0833 04/22/20 1416 04/23/20 0603  NA 129* 125*   < > 131* 135 134* 134* 133*  K 3.3* 2.7*  --   --   --  2.6*  --  3.7  CL 91* 90*  --   --   --  99  --  99  CO2 18* 20*  --   --   --  26  --  26  GLUCOSE 244* 219*  --   --   --  68*  --  137*  BUN 12 14  --   --   --  5*  --  <5*  CREATININE 1.24* 1.39*  --   --   --  0.57  --  0.47  CALCIUM 8.3* 8.1*  --   --   --  7.7*  --  8.0*  MG  --   --   --   --  1.7  --   --  1.7   < > = values in this interval not displayed.   GFR: Estimated Creatinine Clearance: 57.7 mL/min (by C-G formula based on SCr of 0.47 mg/dL). Liver Function Tests: Recent Labs  Lab 04/21/20 1523  AST 21  ALT 14  ALKPHOS 165*  BILITOT 0.5  PROT 6.7  ALBUMIN 2.4*   Recent Labs  Lab 04/21/20 1523  LIPASE 18   No results for input(s): AMMONIA in the last 168 hours. Coagulation Profile: No results for input(s): INR, PROTIME in the last 168 hours. Cardiac Enzymes: No results for input(s): CKTOTAL, CKMB, CKMBINDEX, TROPONINI in the last 168 hours. BNP (last 3 results) No results for input(s): PROBNP  in the last 8760 hours. HbA1C: Recent Labs    04/21/20 1350  HGBA1C 7.7*   CBG: Recent Labs  Lab 04/22/20 1140 04/22/20 1420 04/22/20 2139 04/23/20 0737 04/23/20 1158  GLUCAP 76 159* 106* 154* 130*   Lipid Profile: No results for input(s): CHOL, HDL, LDLCALC, TRIG, CHOLHDL, LDLDIRECT in the last 72 hours. Thyroid Function Tests: No results for input(s): TSH, T4TOTAL, FREET4, T3FREE, THYROIDAB in the last 72 hours. Anemia Panel: Recent Labs    04/22/20 0515  VITAMINB12 346  FOLATE 10.1  TIBC 204*  IRON 7*   Sepsis Labs: Recent Labs  Lab 04/21/20 1642 04/21/20 2114 04/23/20 0603  LATICACIDVEN 3.9* 2.0* 2.7*    Recent Results (from the past 240 hour(s))  Cdiff NAA+O+P+Stool Culture     Status: None (Preliminary result)   Collection Time: 04/21/20  2:45 PM   Specimen: Stool   ST  Result Value Ref Range Status   Salmonella/Shigella Screen WILL FOLLOW  Preliminary   Campylobacter Culture WILL FOLLOW  Preliminary   E coli, Shiga toxin Assay Negative Negative Final   OVA + PARASITE EXAM WILL FOLLOW  Preliminary   Toxigenic C. Difficile by PCR Negative Negative Final  Respiratory Panel by RT PCR (Flu A&B, Covid) - Nasopharyngeal Swab     Status: None   Collection Time: 04/21/20  5:47 PM   Specimen: Nasopharyngeal Swab  Result Value Ref Range Status   SARS Coronavirus 2 by RT PCR NEGATIVE NEGATIVE Final    Comment: (NOTE) SARS-CoV-2 target nucleic acids are NOT DETECTED.  The SARS-CoV-2 RNA is generally detectable in upper respiratoy specimens during the acute phase of infection. The lowest concentration of SARS-CoV-2 viral copies this assay can detect is 131 copies/mL. A negative result does not preclude SARS-Cov-2 infection and should not be used as the sole basis for treatment or other patient management decisions. A negative result may occur with  improper specimen collection/handling, submission of specimen other than nasopharyngeal swab,  presence of viral  mutation(s) within the areas targeted by this assay, and inadequate number of viral copies (<131 copies/mL). A negative result must be combined with clinical observations, patient history, and epidemiological information. The expected result is Negative.  Fact Sheet for Patients:  PinkCheek.be  Fact Sheet for Healthcare Providers:  GravelBags.it  This test is no t yet approved or cleared by the [place] FDA and  has been authorized for detection and/or diagnosis of SARS-CoV-2 by FDA under an Emergency Use Authorization (EUA). This EUA will remain  in effect (meaning this test can be used) for the duration of the COVID-19 declaration under Section 564(b)(1) of the Act, 21 U.S.C. section 360bbb-3(b)(1), unless the authorization is terminated or revoked sooner.     Influenza A by PCR NEGATIVE NEGATIVE Final   Influenza B by PCR NEGATIVE NEGATIVE Final    Comment: (NOTE) The Xpert Xpress SARS-CoV-2/FLU/RSV assay is intended as an aid in  the diagnosis of influenza from Nasopharyngeal swab specimens and  should not be used as a sole basis for treatment. Nasal washings and  aspirates are unacceptable for Xpert Xpress SARS-CoV-2/FLU/RSV  testing.  Fact Sheet for Patients: PinkCheek.be  Fact Sheet for Healthcare Providers: GravelBags.it  This test is not yet approved or cleared by the [place] FDA and  has been authorized for detection and/or diagnosis of SARS-CoV-2 by  FDA under an Emergency Use Authorization (EUA). This EUA will remain  in effect (meaning this test can be used) for the duration of the  Covid-19 declaration under Section 564(b)(1) of the Act, 21  U.S.C. section 360bbb-3(b)(1), unless the authorization is  terminated or revoked. Performed at [facility], [address] [place], [place] [address]   Urine Culture     Status:  Abnormal (Preliminary result)   Collection Time: 04/21/20  5:47 PM   Specimen: Urine, Random  Result Value Ref Range Status   Specimen Description   Final    URINE, RANDOM Performed at [facility], [address] [place], [place] [address]    Special Requests   Final    NONE Performed at [facility], [address], [place], [place] [address]    Culture (A)  Final    >=100,000 COLONIES/mL ENTEROBACTER AEROGENES SUSCEPTIBILITIES TO FOLLOW Performed at [facility], [place] [address], [place], [place] [address]    Report Status PENDING  Incomplete  Gastrointestinal Panel by PCR , Stool     Status: None   Collection Time: 04/22/20  2:07 AM   Specimen: Stool  Result Value Ref Range Status   Campylobacter species NOT DETECTED NOT DETECTED Final   Plesimonas shigelloides NOT DETECTED NOT DETECTED Final   Salmonella species NOT DETECTED NOT DETECTED Final   Yersinia enterocolitica NOT DETECTED NOT DETECTED Final   Vibrio species NOT DETECTED NOT DETECTED Final   Vibrio cholerae NOT DETECTED NOT DETECTED Final   Enteroaggregative E coli (EAEC) NOT DETECTED NOT DETECTED Final   Enteropathogenic E coli (EPEC) NOT DETECTED NOT DETECTED Final   Enterotoxigenic E coli (ETEC) NOT DETECTED NOT DETECTED Final   Shiga like toxin producing E coli (STEC) NOT DETECTED NOT DETECTED Final   Shigella/Enteroinvasive E coli (EIEC) NOT DETECTED NOT DETECTED Final   Cryptosporidium NOT DETECTED NOT DETECTED Final   Cyclospora cayetanensis NOT DETECTED NOT DETECTED Final   Entamoeba histolytica NOT DETECTED NOT DETECTED Final   Giardia lamblia NOT DETECTED NOT DETECTED Final   Adenovirus F40/41 NOT DETECTED NOT DETECTED Final  Astrovirus NOT DETECTED NOT DETECTED Final   Norovirus GI/GII NOT DETECTED NOT DETECTED Final   Rotavirus A NOT DETECTED NOT DETECTED Final   Sapovirus (I, II, IV, and V) NOT DETECTED NOT DETECTED Final    Comment: Performed at [facility],  [address] [place], [place] [address]  C Difficile Quick Screen w PCR reflex     Status: None   Collection Time: 04/22/20  2:07 AM   Specimen: STOOL  Result Value Ref Range Status   C Diff antigen NEGATIVE NEGATIVE Final   C Diff toxin NEGATIVE NEGATIVE Final   C Diff interpretation No C. difficile detected.  Final    Comment: Performed at [facility], [place] [address], [place], [place] [address]     Radiology Studies: CT ABDOMEN PELVIS W CONTRAST  Result Date: 04/21/2020 CLINICAL DATA:  Diarrhea for 1 month. EXAM: CT ABDOMEN AND PELVIS WITH CONTRAST TECHNIQUE: Multidetector CT imaging of the abdomen and pelvis was performed using the standard protocol following bolus administration of intravenous contrast. CONTRAST:  20m OMNIPAQUE IOHEXOL 300 MG/ML  SOLN COMPARISON:  June 19, 2019 FINDINGS: Lower chest: There are stable small pulmonary nodules at the lung bases measuring up to approximately 5 mm. No further follow-up is recommended.The heart size is normal. Hepatobiliary: The liver is normal. Status post cholecystectomy.There is mild intrahepatic and extrahepatic biliary ductal dilatation. Pancreas: There appear to be a cluster cysts at the pancreatic head, similar to prior study. Spleen: Unremarkable. Adrenals/Urinary Tract: --Adrenal glands: Unremarkable. --Right kidney/ureter: No hydronephrosis or radiopaque kidney stones. --Left kidney/ureter: No hydronephrosis or radiopaque kidney stones. --Urinary bladder: There is a punctate focus of gas within the urinary bladder. 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 --Small bowel: Unremarkable. --Colon: There is diffuse circumferential wall thickening of virtually all of the colon, most notably at the level of the cecum and ascending colon. --Appendix: Normal. Vascular/Lymphatic: Atherosclerotic calcification is present within the non-aneurysmal abdominal aorta,  without hemodynamically significant stenosis. --No retroperitoneal lymphadenopathy. --there are few mildly enlarged mesenteric lymph nodes, most notably in the right lower quadrant. These are presumably reactive. --No pelvic or inguinal lymphadenopathy. Reproductive: Status post hysterectomy. No adnexal mass. Other: No ascites or free air. The abdominal wall is normal. Musculoskeletal. No acute displaced fractures. IMPRESSION: 1. Diffuse circumferential wall thickening of virtually all of the colon, most notably at the level of the cecum and ascending colon, consistent with infectious or inflammatory colitis. 2. There is a punctate focus of gas within the urinary bladder. Correlate for history of recent instrumentation. Aortic Atherosclerosis (ICD10-I70.0). Electronically Signed   By: CConstance HolsterM.D.   On: 04/21/2020 18:04    Scheduled Meds: . buPROPion  450 mg Oral Daily  . estradiol  1 Applicatorful Vaginal Once per day on Mon Wed Fri  . insulin aspart  0-5 Units Subcutaneous QHS  . insulin aspart  0-9 Units Subcutaneous TID WC  . insulin detemir  7 Units Subcutaneous QHS  . lamoTRIgine  400 mg Oral QHS  . pantoprazole (PROTONIX) IV  40 mg Intravenous QHS  . pramipexole  1 mg Oral QHS  . risperiDONE  3 mg Oral QHS  . sertraline  150 mg Oral Daily   Continuous Infusions: . sodium chloride Stopped (04/23/20 0544)  . piperacillin-tazobactam (ZOSYN)  IV 3.375 g (04/23/20 1353)     LOS: 2 days   Time spent: 30 minutes.  SLorella Nimrod MD [facility]  If 7PM-7AM,  please contact night-coverage [email or web address]  04/23/2020, 3:41 PM   This record has been created using Systems analyst. Errors have been sought and corrected,but may not always be located. Such creation errors do not reflect on the standard of care.

## 2020-04-23 NOTE — Progress Notes (Signed)
Mobility Specialist - Progress Note   04/23/20 1059  Mobility  Activity Contraindicated/medical hold  Mobility performed by Mobility specialist    Per chart review, pt's Hgb currently at 6.9. Not appropriate for mobility session at this time. Will hold and re-attempt session at a later date/time.   Annamay Laymon Mobility Specialist  04/23/20, 11:01 AM

## 2020-04-23 NOTE — Consult Note (Signed)
Amy Hansen , MD [address] [place], [place], [place], [address] [address], [place], [place], [place], [address] Phone: [phone]  Fax: [phone]  Consultation  Referring Provider:    Dr Reesa Chew  Primary Care Physician:  Volney American, [place] Primary Gastroenterologist:  Dr. Alice Reichert        Reason for Consultation:    Anemia   Date of Admission:  04/21/2020 Date of Consultation:  04/23/2020         HPI:   Amy Hansen is a 64 y.o. female is a patient who follows with Dr. Alice Reichert as an outpatient.  Last seen at their office on 06/19/2019.  She has previously been evaluated for chronic diarrhea, diverticulitis.  Last colonoscopy in March 2010 showed sigmoid diverticulosis and internal hemorrhoids.  At her last visit she continued to have diarrhea and a CT scan of the abdomen was ordered.  It demonstrated no findings.  Some thickening of the proximal sigmoid colon was noted likely peristalsis.  History of gastric bypass.  She did not follow-up subsequently.   She presented to the emergency room yesterday with persistent diarrhea.  Up to 5 episodes a day.  Having fever of 103 degrees for the past 4 days.  Has been on Keflex for the past years and stopped 3 weeks back.  This has been for recurrent UTIs.  She underwent a CT scan of the abdomen and pelvis 2 days back that demonstrated diffuse circumferential wall thickening of virtually all of the colon most notably at the level of the cecum and ascending colon consistent with infectious or inflammatory colitis.  GI PCR panel, stool for C. difficile was negative.  On admission hemoglobin was 7.3 g with an MCV of 64.  Looking back hemoglobin was normal at 12.6 g 4 months back with an MCV of 70.  In November 2020 when seen at [facility] clinic hemoglobin was 12.7 g with MCV of 85.5.  Iron studies were checked which showed an iron of 7, B12 346.  And folate which was normal.  On admission creatinine was elevated at 1.24 and this morning  creatinine 0.57.  She states that she has had diarrhea for a few months. Up to 5 times a day. Watery in nature. Nonbloody. Denies any abdominal pain. She has been taking Excedrin 4 tablets a day for many years for migraines. No other complaints.  Past Medical History:  Diagnosis Date  . Anemia   . Anxiety   . Chronic kidney disease    UTI, hematuria in urine  . Depression   . Diabetes ([place])   . Diverticulosis   . Frequent headaches   . Interstitial cystitis   . Recurrent UTI   . Restless leg syndrome   . Urinary frequency     Past Surgical History:  Procedure Laterality Date  . ABDOMINAL HYSTERECTOMY    . bariatric bypass  2012  . CARPAL TUNNEL RELEASE Right 2003  . CARPAL TUNNEL RELEASE Right    2008  . CHOLECYSTECTOMY  1975  . CYSTOSCOPY W/ RETROGRADES Bilateral 06/06/2015   Procedure: CYSTOSCOPY WITH RETROGRADE PYELOGRAM;  Surgeon: Festus Aloe, MD;  Location: [facility] ORS;  Service: Urology;  Laterality: Bilateral;  . FL INJ LEFT KNEE CT ARTHROGRAM ([facility] HX) Left    1995  . GASTRIC BYPASS  2010  . [place]  2013  . KNEE ARTHROSCOPY Left 1996  . TONSILLECTOMY      Prior to Admission medications   Medication Sig Start Date  End Date Taking? Authorizing Provider  ALPRAZolam Duanne Moron) 0.5 MG tablet Take 1 tablet (0.5 mg total) by mouth 4 (four) times daily as needed for anxiety. 01/26/20  Yes Hurst, Dorothea Glassman, PA-C  buPROPion (WELLBUTRIN XL) 150 MG 24 hr tablet Take 3 tablets (450 mg total) by mouth daily. 12/16/19  Yes Donnal Moat T, PA-C  estradiol (ESTRACE VAGINAL) 0.1 MG/GM vaginal cream Apply 0.35m (pea-sized amount)  just inside the vaginal introitus with a finger-tip on Monday, Wednesday and Friday nights. 04/07/18  Yes McGowan, SLarene BeachA, PA-C  furosemide (LASIX) 20 MG tablet Take 1 tablet (20 mg total) by mouth daily. 07/29/19  Yes End, CHarrell Gave MD  glipiZIDE (GLUCOTROL) 5 MG tablet TAKE 1 TABLET BY MOUTH TWICE DAILY WITH MEALS (APPOINTMENT  REQUIRED  FOR   FUTURE  REFILLS) Patient taking differently: Take 5 mg by mouth 2 (two) times daily before a meal. TAKE 1 TABLET BY MOUTH TWICE DAILY WITH MEALS (APPOINTMENT  REQUIRED  FOR  FUTURE  REFILLS) 04/14/20  Yes LVolney American PA-C  lamoTRIgine (LAMICTAL) 200 MG tablet TAKE 2 TABLETS BY MOUTH AT BEDTIME Patient taking differently: Take 400 mg by mouth at bedtime.  03/14/20  Yes Hurst, THelene KelpT, PA-C  metFORMIN (GLUCOPHAGE) 1000 MG tablet TAKE 1 TABLET BY MOUTH TWICE DAILY WITH MEALS . APPOINTMENT REQUIRED FOR FUTURE REFILLS Patient taking differently: Take 1,000 mg by mouth 2 (two) times daily with a meal. TAKE 1 TABLET BY MOUTH TWICE DAILY WITH MEALS . APPOINTMENT REQUIRED FOR FUTURE REFILLS 04/14/20  Yes LVolney American PA-C  pramipexole (MIRAPEX) 1 MG tablet Take 1 tablet (1 mg total) by mouth at bedtime. 12/20/19  Yes LVolney American PA-C  promethazine (PHENERGAN) 25 MG tablet Take 1 tablet (25 mg total) by mouth every 8 (eight) hours as needed for nausea or vomiting. 09/11/19  Yes LVolney American PA-C  risperiDONE (RISPERDAL) 3 MG tablet TAKE 1 TABLET BY MOUTH EVERY DAY AT BEDTIME Patient taking differently: Take 3 mg by mouth at bedtime.  02/16/20  Yes HDonnal MoatT, PA-C  sertraline (ZOLOFT) 100 MG tablet Take 1.5 tablets (150 mg total) by mouth daily. 12/16/19  Yes HDonnal MoatT, PA-C  clotrimazole (GYNE-LOTRIMIN) 1 % vaginal cream Can apply externally twice daily for 7 days 10/08/19   [provider]  insulin glargine, 2 Unit Dial, (TOUJEO MAX SOLOSTAR) 300 UNIT/ML Solostar Pen Inject 24 units daily. Titrate as instructed. Max daily dose 40 units 01/20/20   LVolney American PA-C  Insulin Pen Needle (PEN NEEDLES) 32G X 4 MM MISC 1 Units by Does not apply route at bedtime. 12/20/19   LVolney American PA-C  ONE TOUCH ULTRA TEST test strip daily. 03/30/16   [provider]    Family History  Problem Relation Age of Onset  . Stroke Father   .  Colon cancer Mother   . Heart failure Sister   . Bladder Cancer Neg Hx   . Kidney disease Neg Hx   . Prostate cancer Neg Hx   . Kidney cancer Neg Hx      Social History   Tobacco Use  . Smoking status: Former Smoker    Packs/day: 0.50    Types: Cigarettes    Quit date: 04/25/1975    Years since quitting: 45.0  . Smokeless tobacco: Never Used  . Tobacco comment: quit 40 years  Vaping Use  . Vaping Use: Never used  Substance Use Topics  . Alcohol use: No  Alcohol/week: 0.0 standard drinks  . Drug use: No    Allergies as of 04/21/2020 - Review Complete 04/21/2020  Allergen Reaction Noted  . Avelox [moxifloxacin hcl in nacl] Anaphylaxis 04/19/2015  . Bactrim [sulfamethoxazole-trimethoprim] Anaphylaxis 06/17/2018  . Ciprofloxacin Other (See Comments) 10/15/2018  . Depakote [divalproex sodium]  06/01/2018  . Imitrex [sumatriptan] Other (See Comments) 05/21/2017  . Stadol [butorphanol] Rash 04/19/2015    Review of Systems:    All systems reviewed and negative except where noted in HPI.   Physical Exam:  Vital signs in last 24 hours: Temp:  [98.5 F (36.9 C)-98.9 F (37.2 C)] 98.9 F (37.2 C) (09/25 0319) Pulse Rate:  [80-97] 94 (09/25 0319) Resp:  [16-22] 18 (09/25 0319) BP: (106-126)/(62-95) 109/66 (09/25 0319) SpO2:  [96 %-100 %] 96 % (09/25 0319) Last BM Date: 04/22/20 General:   Pleasant, cooperative in NAD Head:  Normocephalic and atraumatic. Eyes:   No icterus.   Conjunctiva pink. PERRLA. Ears:  Normal auditory acuity. Neck:  Supple; no masses or thyroidomegaly Lungs: Respirations even and unlabored. Lungs clear to auscultation bilaterally.   No wheezes, crackles, or rhonchi.  Heart:  Regular rate and rhythm;  Without murmur, clicks, rubs or gallops Abdomen:  Soft, nondistended, nontender. Normal bowel sounds. No appreciable masses or hepatomegaly.  No rebound or guarding.  Neurologic:  Alert and oriented x3;  grossly normal neurologically. Psych:  Alert and  cooperative. Normal affect.  LAB RESULTS: Recent Labs    04/22/20 0515 04/22/20 0833 04/23/20 0603  WBC 12.2* 11.6* 7.2  HGB 7.3* 7.1* 6.9*  HCT 23.5* 23.0* 21.9*  PLT 668* 676* 639*   BMET Recent Labs    04/21/20 1523 04/21/20 2114 04/22/20 0833 04/22/20 1416 04/23/20 0603  NA 125*   < > 134* 134* 133*  K 2.7*  --  2.6*  --  3.7  CL 90*  --  99  --  99  CO2 20*  --  26  --  26  GLUCOSE 219*  --  68*  --  137*  BUN 14  --  5*  --  <5*  CREATININE 1.39*  --  0.57  --  0.47  CALCIUM 8.1*  --  7.7*  --  8.0*   < > = values in this interval not displayed.   LFT Recent Labs    04/21/20 1523  PROT 6.7  ALBUMIN 2.4*  AST 21  ALT 14  ALKPHOS 165*  BILITOT 0.5   PT/INR No results for input(s): LABPROT, INR in the last 72 hours.  STUDIES: CT ABDOMEN PELVIS W CONTRAST  Result Date: 04/21/2020 CLINICAL DATA:  Diarrhea for 1 month. EXAM: CT ABDOMEN AND PELVIS WITH CONTRAST TECHNIQUE: Multidetector CT imaging of the abdomen and pelvis was performed using the standard protocol following bolus administration of intravenous contrast. CONTRAST:  75m OMNIPAQUE IOHEXOL 300 MG/ML  SOLN COMPARISON:  June 19, 2019 FINDINGS: Lower chest: There are stable small pulmonary nodules at the lung bases measuring up to approximately 5 mm. No further follow-up is recommended.The heart size is normal. Hepatobiliary: The liver is normal. Status post cholecystectomy.There is mild intrahepatic and extrahepatic biliary ductal dilatation. Pancreas: There appear to be a cluster cysts at the pancreatic head, similar to prior study. Spleen: Unremarkable. Adrenals/Urinary Tract: --Adrenal glands: Unremarkable. --Right kidney/ureter: No hydronephrosis or radiopaque kidney stones. --Left kidney/ureter: No hydronephrosis or radiopaque kidney stones. --Urinary bladder: There is a punctate focus of gas within the urinary bladder. Stomach/Bowel: --Stomach/Duodenum: There are postsurgical changes of  the stomach  related to prior gastric bypass. --Small bowel: Unremarkable. --Colon: There is diffuse circumferential wall thickening of virtually all of the colon, most notably at the level of the cecum and ascending colon. --Appendix: Normal. Vascular/Lymphatic: Atherosclerotic calcification is present within the non-aneurysmal abdominal aorta, without hemodynamically significant stenosis. --No retroperitoneal lymphadenopathy. --there are few mildly enlarged mesenteric lymph nodes, most notably in the right lower quadrant. These are presumably reactive. --No pelvic or inguinal lymphadenopathy. Reproductive: Status post hysterectomy. No adnexal mass. Other: No ascites or free air. The abdominal wall is normal. Musculoskeletal. No acute displaced fractures. IMPRESSION: 1. Diffuse circumferential wall thickening of virtually all of the colon, most notably at the level of the cecum and ascending colon, consistent with infectious or inflammatory colitis. 2. There is a punctate focus of gas within the urinary bladder. Correlate for history of recent instrumentation. Aortic Atherosclerosis (ICD10-I70.0). Electronically Signed   By: Constance Holster M.D.   On: 04/21/2020 18:04      Impression / Plan:   RAVYNN HOGATE is a 64 y.o. y/o female with a patient of Dr. Alice Reichert last seen at their office in November 2020 presents to the hospital with diarrhea and fever.  She has a history of chronic diarrhea which she was seen by them back in November 2024.  She did not follow-up.  Last colonoscopy was back in 2010.  Cannot obtain report but was recommended to be repeated in 10 years.  On admission there was a drastic drop in hemoglobin since November 2020 and new onset microcytosis with iron deficiency.  The B12 levels are less than 400 so it is indeterminate whether she has B12 deficiency.  No overt blood loss per history.  CT scan of the abdomen shows pancolitis most significant at the cecum level. She has a long history of use of  Excedrin which can cause NSAID induced colitis which in turn can cause diarrhea and the long-term use of NSAIDs is associated with iron deficiency anemia.  Plan 1.  IV iron  2.  Check homocysteine and MMA levels.  If both are elevated likely B12 deficiency.  3.  Check celiac serology.  4. Suggest to stop all NSAID use. Commence on PPI and continue for the next 8 to 12 weeks. Since there is no overt blood loss and we have a history of long-term NSAID use would suggest that we perform an EGD and colonoscopy as an outpatient once the colitis has resolved which is very likely due to NSAID use. At that time we can confirm that the colitis if resolved would be consistent with NSAID use. If not resolved will need to rule out ulcerative colitis  5.  In terms of the acute drop in hemoglobin since yesterday it merely reflects hemodilution as the creatinine was 1.24 yesterday and has dropped by 50% 2.6 with rehydration and appropriately the hemoglobin has followed the same.  I would not be concerned of an acute GI bleed unless the patient had hematemesis or melena.  Stool occult testing is not appropriate to be performed in an inpatient setting as it is a test for colon cancer screening only. It  does not rule in or rule out a GI bleed.  6. Check baseline CRP,fecal calprotectin , diet without any lactose, high fructose corn syrup and artificial sweetners  7. Discussed with the patient's nurse if has no evidence of melena or hematochezia the next bowel movement can resume normal diet. I would expect the diarrhea to start improving in  24 to 48 hours.   Thank you for involving me in the care of this patient.      LOS: 2 days   Amy Bellows, MD  04/23/2020, 10:45 AM

## 2020-04-24 LAB — BASIC METABOLIC PANEL
Anion gap: 11 (ref 5–15)
BUN: <5 mg/dL — ABNORMAL LOW (ref 8–23)
CO2: 28 mmol/L (ref 22–32)
Calcium: 7.9 mg/dL — ABNORMAL LOW (ref 8.9–10.3)
Chloride: 96 mmol/L — ABNORMAL LOW (ref 98–111)
Creatinine, Ser: 0.48 mg/dL (ref 0.44–1.00)
GFR calc Af Amer: >60 mL/min (ref >60)
GFR calc non Af Amer: >60 mL/min (ref >60)
Glucose, Bld: 112 mg/dL — ABNORMAL HIGH (ref 70–99)
Potassium: 3 mmol/L — ABNORMAL LOW (ref 3.5–5.1)
Sodium: 135 mmol/L (ref 135–145)

## 2020-04-24 LAB — TYPE AND SCREEN
ABO/RH(D): O POS
Antibody Screen: NEGATIVE
Unit division: 0
Unit division: 0

## 2020-04-24 LAB — CBC
HCT: 27.9 % — ABNORMAL LOW (ref 36.0–46.0)
Hemoglobin: 9.4 g/dL — ABNORMAL LOW (ref 12.0–15.0)
MCH: 21.9 pg — ABNORMAL LOW (ref 26.0–34.0)
MCHC: 33.7 g/dL (ref 30.0–36.0)
MCV: 65 fL — ABNORMAL LOW (ref 80.0–100.0)
Platelets: 574 10*3/uL — ABNORMAL HIGH (ref 150–400)
RBC: 4.29 MIL/uL (ref 3.87–5.11)
RDW: 22.5 % — ABNORMAL HIGH (ref 11.5–15.5)
WBC: 4.4 10*3/uL (ref 4.0–10.5)
nRBC: 0 % (ref 0.0–0.2)

## 2020-04-24 LAB — URINE CULTURE: Culture: >=100000 — AB

## 2020-04-24 LAB — BPAM RBC
Blood Product Expiration Date: 202110282359
Blood Product Expiration Date: 202110282359
ISSUE DATE / TIME: 202109251129
ISSUE DATE / TIME: 202109251946
Unit Type and Rh: 5100
Unit Type and Rh: 5100

## 2020-04-24 LAB — GLUCOSE, CAPILLARY
Glucose-Capillary: 182 mg/dL — ABNORMAL HIGH (ref 70–99)
Glucose-Capillary: 187 mg/dL — ABNORMAL HIGH (ref 70–99)

## 2020-04-24 LAB — HEMOGLOBIN AND HEMATOCRIT, BLOOD
HCT: 29.3 % — ABNORMAL LOW (ref 36.0–46.0)
Hemoglobin: 9.5 g/dL — ABNORMAL LOW (ref 12.0–15.0)

## 2020-04-24 LAB — C-REACTIVE PROTEIN: CRP: 12.7 mg/dL — ABNORMAL HIGH (ref <1.0)

## 2020-04-24 LAB — MAGNESIUM: Magnesium: 1.7 mg/dL (ref 1.7–2.4)

## 2020-04-24 LAB — LACTIC ACID, PLASMA: Lactic Acid, Venous: 1.7 mmol/L (ref 0.5–1.9)

## 2020-04-24 MED ORDER — FERROUS SULFATE 325 (65 FE) MG PO TBEC: 325.0000 mg | DELAYED_RELEASE_TABLET | Freq: Two times a day (BID) | ORAL | 3 refills | Status: DC

## 2020-04-24 MED ORDER — AMOXICILLIN-POT CLAVULANATE 875-125 MG PO TABS: 1.0000 | ORAL_TABLET | Freq: Two times a day (BID) | ORAL | 0 refills | Status: AC

## 2020-04-24 MED ORDER — POTASSIUM CHLORIDE CRYS ER 20 MEQ PO TBCR
40.0000 meq | EXTENDED_RELEASE_TABLET | ORAL | Status: AC
  Administered 2020-04-24 (×2): 40 meq via ORAL
  Filled 2020-04-24 (×2): qty 2

## 2020-04-24 MED ORDER — FOSFOMYCIN TROMETHAMINE 3 G PO PACK: 3.0000 g | PACK | Freq: Once | ORAL | 0 refills | Status: AC

## 2020-04-24 MED ORDER — FOSFOMYCIN TROMETHAMINE 3 G PO PACK
3.0000 g | PACK | ORAL | Status: DC
  Administered 2020-04-24: 3 g via ORAL
  Filled 2020-04-24 (×2): qty 3

## 2020-04-24 MED ORDER — AMOXICILLIN-POT CLAVULANATE 875-125 MG PO TABS
1.0000 | ORAL_TABLET | Freq: Two times a day (BID) | ORAL | Status: DC
  Administered 2020-04-24: 1 via ORAL
  Filled 2020-04-24: qty 1

## 2020-04-24 MED ORDER — ADULT MULTIVITAMIN W/MINERALS CH: 1.0000 | ORAL_TABLET | Freq: Every day | ORAL | 0 refills | Status: DC

## 2020-04-24 MED ORDER — LOPERAMIDE HCL 2 MG PO CAPS: 2.0000 mg | ORAL_CAPSULE | ORAL | 0 refills | Status: DC | PRN

## 2020-04-24 NOTE — Evaluation (Signed)
Physical Therapy Evaluation Patient Details Name: Amy Hansen MRN: [id number] DOB: October 08, 1955 Today's Date: 04/24/2020   History of Present Illness  Pt is a 64 y.o. female presenting to hospital 9/23 with diarrhea x1 month; L lower and mid abdominal pain and fever; nausea, decreased appetite, and generalized weakness.  Pt admitted with severe sepsis secondary to pancolitis, chronic UTI with questionable acute UTI, hypotension, hyponatremia, hypokalemia, AKI, and microcytic anemia.  PMH includes anemia, anxiety, CKD, depression, DM, RLS, recurrent UTI, urinary frequency, OCD, R CTR, gastric bypass.  Clinical Impression  Prior to hospital admission, pt was independent with ambulation; lives with her husband on main level of home with 3 STE.  Currently pt is modified independent with bed mobility; SBA with transfers; CGA ambulating up to 150 feet with RW; and CGA to min assist (railing vs hand hold assist) navigating stairs.  Initially pt appearing cautious and shaky with ambulation but improved balance, gait quality, and confidence noted with RW use and increased distance ambulating.  Pt would benefit from skilled PT to address noted impairments and functional limitations (see below for any additional details).  Upon hospital discharge, pt would benefit from HHPT, initial SBA for functional mobility (using RW), and assist for stairs (pt reports having this assist available).    Follow Up Recommendations Home health PT;Supervision for mobility/OOB;Other (comment) (assist for stairs)    Equipment Recommendations  Rolling walker with 5" wheels (pt reports she will get a RW from her sister (will stop by sister's house on way home))    Recommendations for Other Services       Precautions / Restrictions Precautions Precautions: Fall Restrictions Weight Bearing Restrictions: No      Mobility  Bed Mobility Overal bed mobility: Modified Independent             General bed mobility  comments: Semi-supine to/from sitting without any noted difficulties  Transfers Overall transfer level: Needs assistance Equipment used: None;Rolling walker (2 wheeled) Transfers: Sit to/from [facility] Sit to Stand: Supervision (able to stand x2 trials no AD use and x2 trials with RW use (pt prefering to hold onto something with one hand for support)) Stand pivot transfers: Supervision       General transfer comment: mild increased effort to stand but overall steady and safe  Ambulation/Gait Ambulation/Gait assistance: Min guard Gait Distance (Feet):  (40 feet x2; 150 feet) Assistive device: Rolling walker (2 wheeled)   Gait velocity: mildly decreased   General Gait Details: initially decreased B LE step length and pt appearing cautious but improved step length and confidence noted with increased distance ambulating  Stairs Stairs: Yes Stairs assistance: Min guard;Min assist Stair Management: One rail Right;Forwards;Step to pattern;Alternating pattern Number of Stairs: 8 General stair comments: step to pattern descending 8 steps with R UE support on railing CGA; alternating steps ascending 5 steps with R UE support on railing CGA and then 3 steps with L UE hand hold assist (min assist)  Wheelchair Mobility    Modified Rankin (Stroke Patients Only)       Balance Overall balance assessment: Needs assistance Sitting-balance support: No upper extremity supported;Feet supported Sitting balance-Leahy Scale: Normal Sitting balance - Comments: steady sitting reaching outside BOS   Standing balance support: No upper extremity supported;During functional activity Standing balance-Leahy Scale: Good Standing balance comment: pt able to walk short distances no AD but appearing shaky (no loss of balance noted)  Pertinent Vitals/Pain Pain Assessment: No/denies pain  Vitals (HR and O2 on room air) stable and WFL throughout  treatment session.    Home Living Family/patient expects to be discharged to:: Private residence Living Arrangements: Spouse/significant other Available Help at Discharge: Family;Available PRN/intermittently Type of Home: House Home Access: Stairs to enter Entrance Stairs-Rails: None Entrance Stairs-Number of Steps: 3 Home Layout: One level;Laundry or work area in basement (15 steps down to basement (R railing ascending) for work area) Actor: Environmental consultant - 2 wheels (pt's sister has RW pt can use)      Prior Function Level of Independence: Independent         Comments: No falls in past 6 months; 3 falls in past year     Hand Dominance        Extremity/Trunk Assessment   Upper Extremity Assessment Upper Extremity Assessment: Generalized weakness    Lower Extremity Assessment Lower Extremity Assessment: Generalized weakness    Cervical / Trunk Assessment Cervical / Trunk Assessment: Normal  Communication   Communication: No difficulties  Cognition Arousal/Alertness: Awake/alert Behavior During Therapy: WFL for tasks assessed/performed Overall Cognitive Status: Within Functional Limits for tasks assessed                                        General Comments   Nursing cleared pt for participation in physical therapy.  Pt agreeable to PT session.    Exercises  Walker use; transfers; ambulation; stairs training   Assessment/Plan    PT Assessment Patient needs continued PT services  PT Problem List Decreased strength;Decreased activity tolerance;Decreased balance;Decreased mobility;Decreased knowledge of use of DME       PT Treatment Interventions DME instruction;Gait training;Stair training;Functional mobility training;Therapeutic activities;Therapeutic exercise;Balance training;Patient/family education    PT Goals (Current goals can be found in the Care Plan section)  Acute Rehab PT Goals Patient Stated Goal: to go home PT Goal  Formulation: With patient Time For Goal Achievement: 05/08/20 Potential to Achieve Goals: Good    Frequency Min 2X/week   Barriers to discharge        Co-evaluation               AM-PAC PT "6 Clicks" Mobility  Outcome Measure Help needed turning from your back to your side while in a flat bed without using bedrails?: None Help needed moving from lying on your back to sitting on the side of a flat bed without using bedrails?: None Help needed moving to and from a bed to a chair (including a wheelchair)?: A Little Help needed standing up from a chair using your arms (e.g., wheelchair or bedside chair)?: A Little Help needed to walk in hospital room?: A Little Help needed climbing 3-5 steps with a railing? : A Little 6 Click Score: 20    End of Session Equipment Utilized During Treatment: Gait belt Activity Tolerance: Patient tolerated treatment well Patient left: in bed;with call bell/phone within reach;with nursing/sitter in room (pt sitting on edge of bed eating lunch) Nurse Communication: Mobility status;Precautions;Other (comment) (nurse reports pt does not need bed alarm on) PT Visit Diagnosis: Unsteadiness on feet (R26.81);Other abnormalities of gait and mobility (R26.89);Muscle weakness (generalized) (M62.81);History of falling (Z91.81);Difficulty in walking, not elsewhere classified (R26.2)    Time: 8850-2774 PT Time Calculation (min) (ACUTE ONLY): 32 min   Charges:   PT Evaluation $PT Eval Low Complexity: 1 Low PT Treatments $Gait Training:  23-37 mins       Leitha Bleak, PT 04/24/20, 1:15 PM

## 2020-04-24 NOTE — Plan of Care (Addendum)
Discharge order received. Patient mental status is at baseline. Vital signs stable . No signs of acute distress. Discharge instructions given. Patient verbalized understanding. No other issues noted at this time. Per [facility] PT has been ordered. Not sure if can be done today. Patient was discharged and picked up by family member.

## 2020-04-24 NOTE — Discharge Summary (Signed)
Physician Discharge Summary  Amy Hansen:[id number] DOB: [birth year] DOA: 04/21/2020  PCP: Volney American, PA-C  Admit date: 04/21/2020 Discharge date: 04/24/2020  Admitted From: Home Disposition: Home  Recommendations for Outpatient Follow-up:  1. Follow up with PCP in 1-2 weeks 2. Follow-up with gastroenterology 3. Please obtain BMP/CBC in one week 4. Please follow up on the following pending results: Celiac labs, calprotectin, MMA and homocystine levels.  Home Health: Yes Equipment/Devices: Rolling walker Discharge Condition: Stable CODE STATUS: Full Diet recommendation: Heart Healthy / Carb Modified   Brief/Interim Summary: Amy Hansen a 64 y.o.femalewith medical history significant forchronic UTI on chronic prophylactic Keflex, hypertension, migraine, insulin-dependent type 2 diabetes,chronic diastolic heart failure, GERD, restless leg syndrome, OCD who presents with persistent diarrhea and weakness.  Patient reports that she started having diarrhea a month ago that has been persistent. Has about 5 episodes a day. Denies any bright red blood per rectum or dark stools but does note mucus.  Patient did endorse some subjective fever but she was afebrile on admission.  Denies any recent travels.  Has a prior history of diverticulitis requiring hospitalization. Has an history of chronic UTI and was taking Keflex for the past whole year, recently stopped about 3 weeks ago.  C. difficile colitis and GI pathogen was negative.  He followed up with urology as an outpatient.  But denies any urinary symptoms.  UA looked infected, may be colonization.  FOBT was positive but that can be secondary to colitis.  CT abdomen was concerning of pan colitis.  She was started on Zosyn. Hemoglobin dropped to 6.9 on 04/23/2020, no gross bleeding but FOBT was positive. Patient has hemoglobin of 12.5 in May 2021. On admission it was 7.3. She did received 2 units of PRBC and hemoglobin  on the day of discharge was 9.5. GI was consulted. Patient uses Excedrin daily for headaches. There was some concern of NSAID induced colitis which can also cause chronic diarrhea and chronic GI bleed. She was instructed to avoid NSAID. She will follow up with GI after completion of antibiotics and resolution of inflammation from her pancolitis for outpatient EGD and colonoscopy. They also ordered some specific labs for chronic diarrhea with pending results on discharge.  She was treated with Zosyn while in the hospital and discharged on Augmentin to complete the course of antibiotics. Patient did meet sepsis criteria with tachycardia, tachypnea, leukocytosis and elevated lactic acidosis with pancolitis. C. difficile was negative.  Patient has an history of chronic UTI and was taking Keflex for the past year. She recently stopped taking Keflex for couple of weeks ago. Prior urine cultures with Klebsiella aeruginosa, no current urinary symptoms. UA with positive nitrites and pyuria can be a colonization. Urine cultures grew Enterobacter aeruginosa which were resistant to Keflex and Zosyn. She was given a dose of fosfomycin while in the hospital and discharged home for an another dose to be taken on April 27, 2020. She was advised to discontinue taking Keflex.  She had multiple electrolyte abnormalities on admission secondary to GI losses. Which improved with repletion.  Patient had microcytic anemia. History of gastric bypass. Iron studies with low iron and saturation along with low TIBC more consistent with iron deficiency with anemia of chronic disease.  Patient was experiencing some dizziness with ambulation initially. Most likely secondary to symptomatic anemia. Symptoms improved with blood transfusion. PT also evaluated her and recommended home health PT which was ordered.  She will continue with rest of her home meds  and follow-up with her primary care provider.  Discharge Diagnoses:  Active  Problems:   Recurrent UTI   Restless legs syndrome (RLS)   Poorly controlled type 2 diabetes mellitus (HCC)   OCD (obsessive compulsive disorder)   Colitis   Severe sepsis (HCC)   AKI (acute kidney injury) ([place])   Hypokalemia   Hyponatremia   Anemia  Discharge Instructions  Discharge Instructions    Diet - low sodium heart healthy   Complete by: As directed    Discharge instructions   Complete by: As directed    It was pleasure taking care of you. Your urine culture grew a bacteria which was resistant to Keflex, please do not take Keflex anymore.  You are being provided with 1 more dose of fosfomycin which should be taken on April 27, 2020.  You already received 1 dose in the hospital.  That should take care of your urinary tract infection. You are being given Augmentin for your colitis for 5 more days. Please follow-up with GI in 2 weeks for further recommendations as you are losing blood through your intestines and need colonoscopy for further evaluation. You are also being given iron supplement to keep your blood levels at a reasonable level, iron can also upset your stomach, please take it with meals. If you continue to have diarrhea you can use Imodium and do not use your home dose of Lasix as it can worsen your dehydration when you are losing with diarrhea. You can continue with rest of your medication and follow-up with your primary care provider.   Increase activity slowly   Complete by: As directed      Allergies as of 04/24/2020      Reactions   Avelox [moxifloxacin Hcl In Nacl] Anaphylaxis   Bactrim [sulfamethoxazole-trimethoprim] Anaphylaxis   Ciprofloxacin Other (See Comments)   Pt states she was told never to take this as it is in the same family as Avelox.    Depakote [divalproex Sodium]    Imitrex [sumatriptan] Other (See Comments)   Neck and shoulder pain   Stadol [butorphanol] Rash      Medication List    TAKE these medications   ALPRAZolam 0.5 MG  tablet Commonly known as: XANAX Take 1 tablet (0.5 mg total) by mouth 4 (four) times daily as needed for anxiety.   amoxicillin-clavulanate 875-125 MG tablet Commonly known as: AUGMENTIN Take 1 tablet by mouth every 12 (twelve) hours for 5 days.   buPROPion 150 MG 24 hr tablet Commonly known as: WELLBUTRIN XL Take 3 tablets (450 mg total) by mouth daily.   clotrimazole 1 % vaginal cream Commonly known as: GYNE-LOTRIMIN Can apply externally twice daily for 7 days   estradiol 0.1 MG/GM vaginal cream Commonly known as: ESTRACE VAGINAL Apply 0.16m (pea-sized amount)  just inside the vaginal introitus with a finger-tip on Monday, Wednesday and Friday nights.   ferrous sulfate 325 (65 FE) MG EC tablet Take 1 tablet (325 mg total) by mouth 2 (two) times daily.   fosfomycin 3 g Pack Commonly known as: MONUROL Take 3 g by mouth once for 1 dose. Start taking on: April 27, 2020   furosemide 20 MG tablet Commonly known as: LASIX Take 1 tablet (20 mg total) by mouth daily.   glipiZIDE 5 MG tablet Commonly known as: GLUCOTROL TAKE 1 TABLET BY MOUTH TWICE DAILY WITH MEALS (APPOINTMENT  REQUIRED  FOR  FUTURE  REFILLS) What changed:   how much to take  how to take this  when to take this   lamoTRIgine 200 MG tablet Commonly known as: LAMICTAL TAKE 2 TABLETS BY MOUTH AT BEDTIME   loperamide 2 MG capsule Commonly known as: IMODIUM Take 1 capsule (2 mg total) by mouth as needed for diarrhea or loose stools.   metFORMIN 1000 MG tablet Commonly known as: GLUCOPHAGE TAKE 1 TABLET BY MOUTH TWICE DAILY WITH MEALS . APPOINTMENT REQUIRED FOR FUTURE REFILLS What changed:   how much to take  how to take this  when to take this   multivitamin with minerals Tabs tablet Take 1 tablet by mouth daily. Start taking on: April 25, 2020   ONE TOUCH ULTRA TEST test strip Generic drug: glucose blood daily.   Pen Needles 32G X 4 MM Misc 1 Units by Does not apply route at  bedtime.   pramipexole 1 MG tablet Commonly known as: MIRAPEX Take 1 tablet (1 mg total) by mouth at bedtime.   promethazine 25 MG tablet Commonly known as: PHENERGAN Take 1 tablet (25 mg total) by mouth every 8 (eight) hours as needed for nausea or vomiting.   risperiDONE 3 MG tablet Commonly known as: RISPERDAL TAKE 1 TABLET BY MOUTH EVERY DAY AT BEDTIME   sertraline 100 MG tablet Commonly known as: ZOLOFT Take 1.5 tablets (150 mg total) by mouth daily.   Toujeo Max SoloStar 300 UNIT/ML Solostar Pen Generic drug: insulin glargine (2 Unit Dial) Inject 24 units daily. Titrate as instructed. Max daily dose 40 units       Follow-up Information    [name], MD. Schedule an appointment as soon as possible for a visit in 2 week(s).   Specialty: Gastroenterology Contact information: [place] [address] [phone]        Volney American, [place]. Schedule an appointment as soon as possible for a visit.   Specialty: Family Medicine Contact information: [place] [address] [phone]              Allergies  Allergen Reactions  . Avelox [Moxifloxacin Hcl In Nacl] Anaphylaxis  . Bactrim [Sulfamethoxazole-Trimethoprim] Anaphylaxis  . Ciprofloxacin Other (See Comments)    Pt states she was told never to take this as it is in the same family as Avelox.   . Depakote [Divalproex Sodium]   . Imitrex [Sumatriptan] Other (See Comments)    Neck and shoulder pain  . Stadol [Butorphanol] Rash    Consultations:  GI  Procedures/Studies: CT ABDOMEN PELVIS W CONTRAST  Result Date: 04/21/2020 CLINICAL DATA:  Diarrhea for 1 month. EXAM: CT ABDOMEN AND PELVIS WITH CONTRAST TECHNIQUE: Multidetector CT imaging of the abdomen and pelvis was performed using the standard protocol following bolus administration of intravenous contrast. CONTRAST:  107m OMNIPAQUE IOHEXOL 300 MG/ML  SOLN COMPARISON:  June 19, 2019 FINDINGS: Lower  chest: There are stable small pulmonary nodules at the lung bases measuring up to approximately 5 mm. No further follow-up is recommended.The heart size is normal. Hepatobiliary: The liver is normal. Status post cholecystectomy.There is mild intrahepatic and extrahepatic biliary ductal dilatation. Pancreas: There appear to be a cluster cysts at the pancreatic head, similar to prior study. Spleen: Unremarkable. Adrenals/Urinary Tract: --Adrenal glands: Unremarkable. --Right kidney/ureter: No hydronephrosis or radiopaque kidney stones. --Left kidney/ureter: No hydronephrosis or radiopaque kidney stones. --Urinary bladder: There is a punctate focus of gas within the urinary bladder. 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 --Small bowel: Unremarkable. --Colon: There is diffuse  circumferential wall thickening of virtually all of the colon, most notably at the level of the cecum and ascending colon. --Appendix: Normal. Vascular/Lymphatic: Atherosclerotic calcification is present within the non-aneurysmal abdominal aorta, without hemodynamically significant stenosis. --No retroperitoneal lymphadenopathy. --there are few mildly enlarged mesenteric lymph nodes, most notably in the right lower quadrant. These are presumably reactive. --No pelvic or inguinal lymphadenopathy. Reproductive: Status post hysterectomy. No adnexal mass. Other: No ascites or free air. The abdominal wall is normal. Musculoskeletal. No acute displaced fractures. IMPRESSION: 1. Diffuse circumferential wall thickening of virtually all of the colon, most notably at the level of the cecum and ascending colon, consistent with infectious or inflammatory colitis. 2. There is a punctate focus of gas within the urinary bladder. Correlate for history of recent instrumentation. Aortic Atherosclerosis (ICD10-I70.0). Electronically Signed   By: Constance Holster M.D.   On: 04/21/2020 18:04      Subjective: Patient was feeling better when seen today. Dizziness has been improved. She wants to go home. She was also asking for a letter for work which was provided.  Discharge Exam: Vitals:   04/24/20 0410 04/24/20 1128  BP: 117/69 119/88  Pulse: 84 84  Resp: 16 18  Temp: 98.4 F (36.9 C) 98.2 F (36.8 C)  SpO2: 94% 97%   Vitals:   04/23/20 2013 04/23/20 2305 04/24/20 0410 04/24/20 1128  BP: 117/70 100/65 117/69 119/88  Pulse: 84 75 84 84  Resp: 19 16 16 18   Temp: 98.3 F (36.8 C) 98.5 F (36.9 C) 98.4 F (36.9 C) 98.2 F (36.8 C)  TempSrc:   Oral   SpO2:  98% 94% 97%  Weight:      Height:        General: Pt is alert, awake, not in acute distress Cardiovascular: RRR, S1/S2 +, no rubs, no gallops Respiratory: CTA bilaterally, no wheezing, no rhonchi Abdominal: Soft, NT, ND, bowel sounds + Extremities: no edema, no cyanosis   The results of significant diagnostics from this hospitalization (including imaging, microbiology, ancillary and laboratory) are listed below for reference.    Microbiology: Recent Results (from the past 240 hour(s))  Cdiff NAA+O+P+Stool Culture     Status: None (Preliminary result)   Collection Time: 04/21/20  2:45 PM   Specimen: Stool   ST  Result Value Ref Range Status   Salmonella/Shigella Screen Final report  Final   Stool Culture result 1 (RSASHR) Comment  Final    Comment: No Salmonella or Shigella recovered.   Campylobacter Culture Preliminary report  Preliminary   Stool Culture result 1 (CMPCXR) Comment  Preliminary    Comment: Specimen has been received and testing has been initiated.   E coli, Shiga toxin Assay Negative Negative Final   OVA + PARASITE EXAM WILL FOLLOW  Preliminary   Toxigenic C. Difficile by PCR Negative Negative Final  Respiratory Panel by RT PCR (Flu A&B, Covid) - Nasopharyngeal Swab     Status: None   Collection Time: 04/21/20  5:47 PM   Specimen: Nasopharyngeal Swab  Result Value Ref Range Status    SARS Coronavirus 2 by RT PCR NEGATIVE NEGATIVE Final    Comment: (NOTE) SARS-CoV-2 target nucleic acids are NOT DETECTED.  The SARS-CoV-2 RNA is generally detectable in upper respiratoy specimens during the acute phase of infection. The lowest concentration of SARS-CoV-2 viral copies this assay can detect is 131 copies/mL. A negative result does not preclude SARS-Cov-2 infection and should not be used as the sole basis for treatment or other patient management  decisions. A negative result may occur with  improper specimen collection/handling, submission of specimen other than nasopharyngeal swab, presence of viral mutation(s) within the areas targeted by this assay, and inadequate number of viral copies (<131 copies/mL). A negative result must be combined with clinical observations, patient history, and epidemiological information. The expected result is Negative.  Fact Sheet for Patients:  PinkCheek.be  Fact Sheet for Healthcare Providers:  GravelBags.it  This test is no t yet approved or cleared by the [place] FDA and  has been authorized for detection and/or diagnosis of SARS-CoV-2 by FDA under an Emergency Use Authorization (EUA). This EUA will remain  in effect (meaning this test can be used) for the duration of the COVID-19 declaration under Section 564(b)(1) of the Act, 21 U.S.C. section 360bbb-3(b)(1), unless the authorization is terminated or revoked sooner.     Influenza A by PCR NEGATIVE NEGATIVE Final   Influenza B by PCR NEGATIVE NEGATIVE Final    Comment: (NOTE) The Xpert Xpress SARS-CoV-2/FLU/RSV assay is intended as an aid in  the diagnosis of influenza from Nasopharyngeal swab specimens and  should not be used as a sole basis for treatment. Nasal washings and  aspirates are unacceptable for Xpert Xpress SARS-CoV-2/FLU/RSV  testing.  Fact Sheet for  Patients: PinkCheek.be  Fact Sheet for Healthcare Providers: GravelBags.it  This test is not yet approved or cleared by the [place] FDA and  has been authorized for detection and/or diagnosis of SARS-CoV-2 by  FDA under an Emergency Use Authorization (EUA). This EUA will remain  in effect (meaning this test can be used) for the duration of the  Covid-19 declaration under Section 564(b)(1) of the Act, 21  U.S.C. section 360bbb-3(b)(1), unless the authorization is  terminated or revoked. Performed at [facility], [address] [place], [place] [address]   Urine Culture     Status: Abnormal   Collection Time: 04/21/20  5:47 PM   Specimen: Urine, Random  Result Value Ref Range Status   Specimen Description   Final    URINE, RANDOM Performed at [facility], [address] [place], [place] [address]    Special Requests   Final    NONE Performed at [facility], [address] [place], [place] [address]    Culture >=100,000 COLONIES/mL ENTEROBACTER AEROGENES (A)  Final   Report Status 04/24/2020 FINAL  Final   Organism ID, Bacteria ENTEROBACTER AEROGENES (A)  Final      Susceptibility   Enterobacter aerogenes - MIC*    CEFAZOLIN >=64 RESISTANT Resistant     CEFTRIAXONE 32 RESISTANT Resistant     CIPROFLOXACIN <=0.25 SENSITIVE Sensitive     GENTAMICIN <=1 SENSITIVE Sensitive     IMIPENEM 1 SENSITIVE Sensitive     NITROFURANTOIN 64 INTERMEDIATE Intermediate     TRIMETH/SULFA <=20 SENSITIVE Sensitive     PIP/TAZO >=128 RESISTANT Resistant     * >=100,000 COLONIES/mL ENTEROBACTER AEROGENES  Gastrointestinal Panel by PCR , Stool     Status: None   Collection Time: 04/22/20  2:07 AM   Specimen: Stool  Result Value Ref Range Status   Campylobacter species NOT DETECTED NOT DETECTED Final   Plesimonas shigelloides NOT DETECTED NOT DETECTED Final   Salmonella species NOT DETECTED NOT  DETECTED Final   Yersinia enterocolitica NOT DETECTED NOT DETECTED Final   Vibrio species NOT DETECTED NOT DETECTED Final   Vibrio cholerae NOT DETECTED NOT DETECTED Final   Enteroaggregative E coli (EAEC) NOT DETECTED NOT DETECTED Final  Enteropathogenic E coli (EPEC) NOT DETECTED NOT DETECTED Final   Enterotoxigenic E coli (ETEC) NOT DETECTED NOT DETECTED Final   Shiga like toxin producing E coli (STEC) NOT DETECTED NOT DETECTED Final   Shigella/Enteroinvasive E coli (EIEC) NOT DETECTED NOT DETECTED Final   Cryptosporidium NOT DETECTED NOT DETECTED Final   Cyclospora cayetanensis NOT DETECTED NOT DETECTED Final   Entamoeba histolytica NOT DETECTED NOT DETECTED Final   Giardia lamblia NOT DETECTED NOT DETECTED Final   Adenovirus F40/41 NOT DETECTED NOT DETECTED Final   Astrovirus NOT DETECTED NOT DETECTED Final   Norovirus GI/GII NOT DETECTED NOT DETECTED Final   Rotavirus A NOT DETECTED NOT DETECTED Final   Sapovirus (I, II, IV, and V) NOT DETECTED NOT DETECTED Final    Comment: Performed at [facility], [address] [place], [place] [address]  C Difficile Quick Screen w PCR reflex     Status: None   Collection Time: 04/22/20  2:07 AM   Specimen: STOOL  Result Value Ref Range Status   C Diff antigen NEGATIVE NEGATIVE Final   C Diff toxin NEGATIVE NEGATIVE Final   C Diff interpretation No C. difficile detected.  Final    Comment: Performed at [facility], [place] [address], [place], [place] [address]     Labs: BNP (last 3 results) No results for input(s): BNP in the last 8760 hours. Basic Metabolic Panel: Recent Labs  Lab 04/21/20 1406 04/21/20 1523 04/21/20 2114 04/22/20 0515 04/22/20 0833 04/22/20 1416 04/23/20 0603 04/24/20 0416  NA 129* 125*   < > 135 134* 134* 133* 135  K 3.3* 2.7*  --   --  2.6*  --  3.7 3.0*  CL 91* 90*  --   --  99  --  99 96*  CO2 18* 20*  --   --  26  --  26 28  GLUCOSE 244* 219*  --   --  68*  --  137* 112*  BUN 12 14  --    --  5*  --  <5* <5*  CREATININE 1.24* 1.39*  --   --  0.57  --  0.47 0.48  CALCIUM 8.3* 8.1*  --   --  7.7*  --  8.0* 7.9*  MG  --   --   --  1.7  --   --  1.7 1.7   < > = values in this interval not displayed.   Liver Function Tests: Recent Labs  Lab 04/21/20 1523  AST 21  ALT 14  ALKPHOS 165*  BILITOT 0.5  PROT 6.7  ALBUMIN 2.4*   Recent Labs  Lab 04/21/20 1523  LIPASE 18   No results for input(s): AMMONIA in the last 168 hours. CBC: Recent Labs  Lab 04/21/20 1503 04/21/20 1523 04/21/20 1523 04/22/20 0515 04/22/20 5427 04/23/20 0603 04/24/20 0034 04/24/20 0416  WBC 17.8* 19.5*  --  12.2* 11.6* 7.2  --  4.4  NEUTROABS 15.5*  --   --   --   --   --   --   --   HGB 8.2* 9.5*   < > 7.3* 7.1* 6.9* 9.5* 9.4*  HCT 27.7* 29.6*   < > 23.5* 23.0* 21.9* 29.3* 27.9*  MCV 66* 62.1*  --  64.0* 63.4* 63.5*  --  65.0*  PLT 567* 583*  --  668* 676* 639*  --  574*   < > = values in this interval not displayed.   Cardiac Enzymes: No  results for input(s): CKTOTAL, CKMB, CKMBINDEX, TROPONINI in the last 168 hours. BNP: Invalid input(s): POCBNP CBG: Recent Labs  Lab 04/23/20 1158 04/23/20 1647 04/23/20 2106 04/24/20 0745 04/24/20 1123  GLUCAP 130* 127* 145* 182* 187*   D-Dimer No results for input(s): DDIMER in the last 72 hours. Hgb A1c Recent Labs    04/21/20 1350  HGBA1C 7.7*   Lipid Profile No results for input(s): CHOL, HDL, LDLCALC, TRIG, CHOLHDL, LDLDIRECT in the last 72 hours. Thyroid function studies No results for input(s): TSH, T4TOTAL, T3FREE, THYROIDAB in the last 72 hours.  Invalid input(s): FREET3 Anemia work up Recent Labs    04/22/20 0515  VITAMINB12 346  FOLATE 10.1  TIBC 204*  IRON 7*   Urinalysis    Component Value Date/Time   COLORURINE YELLOW (A) 04/21/2020 1747   APPEARANCEUR CLOUDY (A) 04/21/2020 1747   APPEARANCEUR Clear 12/14/2019 1040   LABSPEC 1.011 04/21/2020 1747   LABSPEC 1.003 01/09/2012 2126   PHURINE 5.0 04/21/2020  1747   GLUCOSEU NEGATIVE 04/21/2020 1747   GLUCOSEU Negative 01/09/2012 2126   HGBUR NEGATIVE 04/21/2020 1747   BILIRUBINUR NEGATIVE 04/21/2020 1747   BILIRUBINUR Negative 12/14/2019 1040   BILIRUBINUR Negative 01/09/2012 2126   KETONESUR NEGATIVE 04/21/2020 1747   PROTEINUR NEGATIVE 04/21/2020 1747   NITRITE POSITIVE (A) 04/21/2020 1747   LEUKOCYTESUR LARGE (A) 04/21/2020 1747   LEUKOCYTESUR Negative 01/09/2012 2126   Sepsis Labs Invalid input(s): PROCALCITONIN,  WBC,  LACTICIDVEN Microbiology Recent Results (from the past 240 hour(s))  Cdiff NAA+O+P+Stool Culture     Status: None (Preliminary result)   Collection Time: 04/21/20  2:45 PM   Specimen: Stool   ST  Result Value Ref Range Status   Salmonella/Shigella Screen Final report  Final   Stool Culture result 1 (RSASHR) Comment  Final    Comment: No Salmonella or Shigella recovered.   Campylobacter Culture Preliminary report  Preliminary   Stool Culture result 1 (CMPCXR) Comment  Preliminary    Comment: Specimen has been received and testing has been initiated.   E coli, Shiga toxin Assay Negative Negative Final   OVA + PARASITE EXAM WILL FOLLOW  Preliminary   Toxigenic C. Difficile by PCR Negative Negative Final  Respiratory Panel by RT PCR (Flu A&B, Covid) - Nasopharyngeal Swab     Status: None   Collection Time: 04/21/20  5:47 PM   Specimen: Nasopharyngeal Swab  Result Value Ref Range Status   SARS Coronavirus 2 by RT PCR NEGATIVE NEGATIVE Final    Comment: (NOTE) SARS-CoV-2 target nucleic acids are NOT DETECTED.  The SARS-CoV-2 RNA is generally detectable in upper respiratoy specimens during the acute phase of infection. The lowest concentration of SARS-CoV-2 viral copies this assay can detect is 131 copies/mL. A negative result does not preclude SARS-Cov-2 infection and should not be used as the sole basis for treatment or other patient management decisions. A negative result may occur with  improper specimen  collection/handling, submission of specimen other than nasopharyngeal swab, presence of viral mutation(s) within the areas targeted by this assay, and inadequate number of viral copies (<131 copies/mL). A negative result must be combined with clinical observations, patient history, and epidemiological information. The expected result is Negative.  Fact Sheet for Patients:  PinkCheek.be  Fact Sheet for Healthcare Providers:  GravelBags.it  This test is no t yet approved or cleared by the [place] FDA and  has been authorized for detection and/or diagnosis of SARS-CoV-2 by FDA under an Emergency Use Authorization (EUA).  This EUA will remain  in effect (meaning this test can be used) for the duration of the COVID-19 declaration under Section 564(b)(1) of the Act, 21 U.S.C. section 360bbb-3(b)(1), unless the authorization is terminated or revoked sooner.     Influenza A by PCR NEGATIVE NEGATIVE Final   Influenza B by PCR NEGATIVE NEGATIVE Final    Comment: (NOTE) The Xpert Xpress SARS-CoV-2/FLU/RSV assay is intended as an aid in  the diagnosis of influenza from Nasopharyngeal swab specimens and  should not be used as a sole basis for treatment. Nasal washings and  aspirates are unacceptable for Xpert Xpress SARS-CoV-2/FLU/RSV  testing.  Fact Sheet for Patients: PinkCheek.be  Fact Sheet for Healthcare Providers: [email or web address]  This test is not yet approved or cleared by the [place] FDA and  has been authorized for detection and/or diagnosis of SARS-CoV-2 by  FDA under an Emergency Use Authorization (EUA). This EUA will remain  in effect (meaning this test can be used) for the duration of the  Covid-19 declaration under Section 564(b)(1) of the Act, 21  U.S.C. section 360bbb-3(b)(1), unless the authorization is  terminated or revoked. Performed at [facility], [address] [place], [place] [address]   Urine Culture     Status: Abnormal   Collection Time: 04/21/20  5:47 PM   Specimen: Urine, Random  Result Value Ref Range Status   Specimen Description   Final    URINE, RANDOM Performed at [facility], [address] [place], [place] [address]    Special Requests   Final    NONE Performed at [facility], [address] [place], [place] [address]    Culture >=100,000 COLONIES/mL ENTEROBACTER AEROGENES (A)  Final   Report Status 04/24/2020 FINAL  Final   Organism ID, Bacteria ENTEROBACTER AEROGENES (A)  Final      Susceptibility   Enterobacter aerogenes - MIC*    CEFAZOLIN >=64 RESISTANT Resistant     CEFTRIAXONE 32 RESISTANT Resistant     CIPROFLOXACIN <=0.25 SENSITIVE Sensitive     GENTAMICIN <=1 SENSITIVE Sensitive     IMIPENEM 1 SENSITIVE Sensitive     NITROFURANTOIN 64 INTERMEDIATE Intermediate     TRIMETH/SULFA <=20 SENSITIVE Sensitive     PIP/TAZO >=128 RESISTANT Resistant     * >=100,000 COLONIES/mL ENTEROBACTER AEROGENES  Gastrointestinal Panel by PCR , Stool     Status: None   Collection Time: 04/22/20  2:07 AM   Specimen: Stool  Result Value Ref Range Status   Campylobacter species NOT DETECTED NOT DETECTED Final   Plesimonas shigelloides NOT DETECTED NOT DETECTED Final   Salmonella species NOT DETECTED NOT DETECTED Final   Yersinia enterocolitica NOT DETECTED NOT DETECTED Final   Vibrio species NOT DETECTED NOT DETECTED Final   Vibrio cholerae NOT DETECTED NOT DETECTED Final   Enteroaggregative E coli (EAEC) NOT DETECTED NOT DETECTED Final   Enteropathogenic E coli (EPEC) NOT DETECTED NOT DETECTED Final   Enterotoxigenic E coli (ETEC) NOT DETECTED NOT DETECTED Final   Shiga like toxin producing E coli (STEC) NOT DETECTED NOT DETECTED Final   Shigella/Enteroinvasive E coli (EIEC) NOT DETECTED NOT DETECTED Final   Cryptosporidium NOT DETECTED NOT DETECTED Final   Cyclospora  cayetanensis NOT DETECTED NOT DETECTED Final   Entamoeba histolytica NOT DETECTED NOT DETECTED Final   Giardia lamblia NOT DETECTED NOT DETECTED Final   Adenovirus F40/41 NOT DETECTED NOT DETECTED Final   Astrovirus NOT DETECTED NOT DETECTED Final   Norovirus GI/GII NOT DETECTED  NOT DETECTED Final   Rotavirus A NOT DETECTED NOT DETECTED Final   Sapovirus (I, II, IV, and V) NOT DETECTED NOT DETECTED Final    Comment: Performed at [facility], [address] [place], [place] [address]  C Difficile Quick Screen w PCR reflex     Status: None   Collection Time: 04/22/20  2:07 AM   Specimen: STOOL  Result Value Ref Range Status   C Diff antigen NEGATIVE NEGATIVE Final   C Diff toxin NEGATIVE NEGATIVE Final   C Diff interpretation No C. difficile detected.  Final    Comment: Performed at [facility], [place] [address], [place], [place] [address]    Time coordinating discharge: Over 30 minutes  SIGNED:  Lorella Nimrod, MD  [facility] 04/24/2020, 1:08 PM  If 7PM-7AM, please contact night-coverage [email or web address]  This record has been created using Systems analyst. Errors have been sought and corrected,but may not always be located. Such creation errors do not reflect on the standard of care.

## 2020-04-25 ENCOUNTER — Encounter: Payer: Self-pay | Admitting: Emergency Medicine

## 2020-04-25 ENCOUNTER — Observation Stay
Admission: EM | Admit: 2020-04-25 | Discharge: 2020-04-26 | Disposition: A | Discharge status: Home / Self Care | Payer: Self-pay | Attending: Internal Medicine | Admitting: Internal Medicine

## 2020-04-25 ENCOUNTER — Emergency Department: Payer: Self-pay

## 2020-04-25 ENCOUNTER — Ambulatory Visit: Payer: Self-pay

## 2020-04-25 ENCOUNTER — Other Ambulatory Visit: Payer: Self-pay

## 2020-04-25 DIAGNOSIS — K5100 Ulcerative (chronic) pancolitis without complications: Secondary | ICD-10-CM

## 2020-04-25 DIAGNOSIS — Z20822 Contact with and (suspected) exposure to covid-19: Secondary | ICD-10-CM | POA: Insufficient documentation

## 2020-04-25 DIAGNOSIS — R471 Dysarthria and anarthria: Secondary | ICD-10-CM | POA: Diagnosis present

## 2020-04-25 DIAGNOSIS — Z794 Long term (current) use of insulin: Secondary | ICD-10-CM | POA: Insufficient documentation

## 2020-04-25 DIAGNOSIS — K529 Noninfective gastroenteritis and colitis, unspecified: Secondary | ICD-10-CM

## 2020-04-25 DIAGNOSIS — N390 Urinary tract infection, site not specified: Secondary | ICD-10-CM | POA: Diagnosis present

## 2020-04-25 DIAGNOSIS — F429 Obsessive-compulsive disorder, unspecified: Secondary | ICD-10-CM | POA: Diagnosis present

## 2020-04-25 DIAGNOSIS — Z87891 Personal history of nicotine dependence: Secondary | ICD-10-CM | POA: Insufficient documentation

## 2020-04-25 DIAGNOSIS — I129 Hypertensive chronic kidney disease with stage 1 through stage 4 chronic kidney disease, or unspecified chronic kidney disease: Secondary | ICD-10-CM | POA: Insufficient documentation

## 2020-04-25 DIAGNOSIS — R202 Paresthesia of skin: Secondary | ICD-10-CM | POA: Insufficient documentation

## 2020-04-25 DIAGNOSIS — E119 Type 2 diabetes mellitus without complications: Secondary | ICD-10-CM

## 2020-04-25 DIAGNOSIS — G2581 Restless legs syndrome: Secondary | ICD-10-CM | POA: Diagnosis present

## 2020-04-25 DIAGNOSIS — Z79899 Other long term (current) drug therapy: Secondary | ICD-10-CM | POA: Insufficient documentation

## 2020-04-25 DIAGNOSIS — R260 Ataxic gait: Secondary | ICD-10-CM | POA: Insufficient documentation

## 2020-04-25 DIAGNOSIS — N189 Chronic kidney disease, unspecified: Secondary | ICD-10-CM | POA: Insufficient documentation

## 2020-04-25 DIAGNOSIS — D509 Iron deficiency anemia, unspecified: Secondary | ICD-10-CM | POA: Diagnosis present

## 2020-04-25 DIAGNOSIS — H532 Diplopia: Secondary | ICD-10-CM

## 2020-04-25 DIAGNOSIS — R270 Ataxia, unspecified: Secondary | ICD-10-CM

## 2020-04-25 DIAGNOSIS — R200 Anesthesia of skin: Secondary | ICD-10-CM

## 2020-04-25 DIAGNOSIS — R531 Weakness: Principal | ICD-10-CM

## 2020-04-25 LAB — DIFFERENTIAL
Abs Immature Granulocytes: 0.28 10*3/uL — ABNORMAL HIGH (ref 0.00–0.07)
Basophils Absolute: 0 10*3/uL (ref 0.0–0.1)
Basophils Relative: 0 %
Eosinophils Absolute: 0 10*3/uL (ref 0.0–0.5)
Eosinophils Relative: 0 %
Immature Granulocytes: 5 %
Lymphocytes Relative: 17 %
Lymphs Abs: 0.9 10*3/uL (ref 0.7–4.0)
Monocytes Absolute: 0.3 10*3/uL (ref 0.1–1.0)
Monocytes Relative: 6 %
Neutro Abs: 3.9 10*3/uL (ref 1.7–7.7)
Neutrophils Relative %: 72 %
Smear Review: NORMAL

## 2020-04-25 LAB — CBC
HCT: 34.3 % — ABNORMAL LOW (ref 36.0–46.0)
Hemoglobin: 10.9 g/dL — ABNORMAL LOW (ref 12.0–15.0)
MCH: 22 pg — ABNORMAL LOW (ref 26.0–34.0)
MCHC: 31.8 g/dL (ref 30.0–36.0)
MCV: 69.2 fL — ABNORMAL LOW (ref 80.0–100.0)
Platelets: 675 10*3/uL — ABNORMAL HIGH (ref 150–400)
RBC: 4.96 MIL/uL (ref 3.87–5.11)
RDW: 23.1 % — ABNORMAL HIGH (ref 11.5–15.5)
WBC: 5.5 10*3/uL (ref 4.0–10.5)
nRBC: 0 % (ref 0.0–0.2)

## 2020-04-25 LAB — COMPREHENSIVE METABOLIC PANEL
ALT: 13 U/L (ref 0–44)
AST: 15 U/L (ref 15–41)
Albumin: 2.3 g/dL — ABNORMAL LOW (ref 3.5–5.0)
Alkaline Phosphatase: 176 U/L — ABNORMAL HIGH (ref 38–126)
Anion gap: 12 (ref 5–15)
BUN: <5 mg/dL — ABNORMAL LOW (ref 8–23)
CO2: 26 mmol/L (ref 22–32)
Calcium: 8.3 mg/dL — ABNORMAL LOW (ref 8.9–10.3)
Chloride: 97 mmol/L — ABNORMAL LOW (ref 98–111)
Creatinine, Ser: 0.55 mg/dL (ref 0.44–1.00)
GFR calc Af Amer: >60 mL/min (ref >60)
GFR calc non Af Amer: >60 mL/min (ref >60)
Glucose, Bld: 143 mg/dL — ABNORMAL HIGH (ref 70–99)
Potassium: 3.6 mmol/L (ref 3.5–5.1)
Sodium: 135 mmol/L (ref 135–145)
Total Bilirubin: 0.5 mg/dL (ref 0.3–1.2)
Total Protein: 6.3 g/dL — ABNORMAL LOW (ref 6.5–8.1)

## 2020-04-25 LAB — APTT: aPTT: 32 seconds (ref 24–36)

## 2020-04-25 LAB — GLUCOSE, CAPILLARY
Glucose-Capillary: 149 mg/dL — ABNORMAL HIGH (ref 70–99)
Glucose-Capillary: 116 mg/dL — ABNORMAL HIGH (ref 70–99)

## 2020-04-25 LAB — RESPIRATORY PANEL BY RT PCR (FLU A&B, COVID)
Influenza A by PCR: NEGATIVE
Influenza B by PCR: NEGATIVE
SARS Coronavirus 2 by RT PCR: NEGATIVE

## 2020-04-25 LAB — PROTIME-INR
INR: 0.9 (ref 0.8–1.2)
Prothrombin Time: 11.5 seconds (ref 11.4–15.2)

## 2020-04-25 IMAGING — CT CT HEAD W/O CM
3 series · 15 of 46 positions shown, 18 images · non-contrast
Comparison: [DATE]

CLINICAL DATA: Dizziness and diplopia

EXAM:
CT HEAD WITHOUT CONTRAST
TECHNIQUE: Contiguous axial images were obtained from the base of the skull
through the vertex without intravenous contrast.

[Series 3: head wo · axial · 0.40mm/px · z∈[+271,+391]mm · 9 of 29 slices shown, 12 images]
[im 3/29  brain]
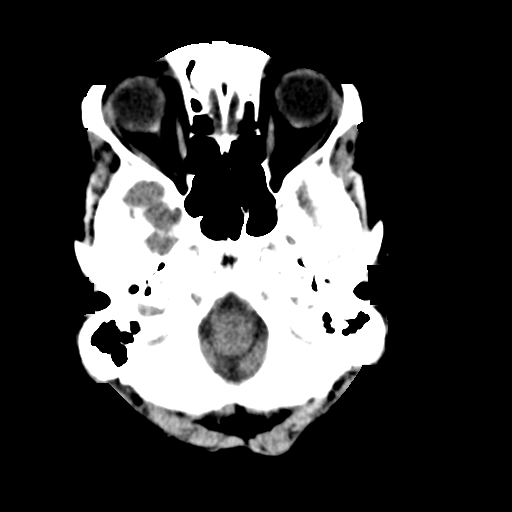
[im 3/29  bone]
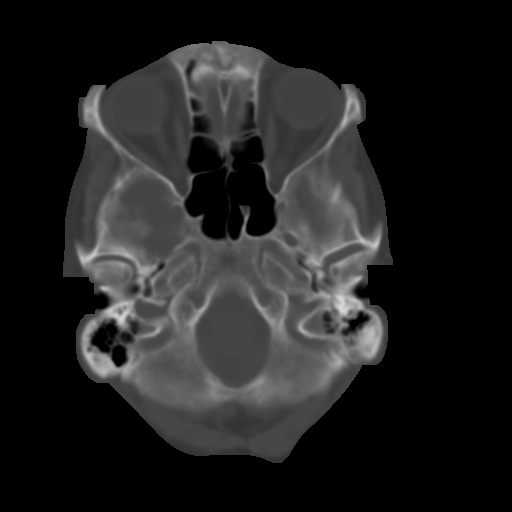
[im 6/29  brain]
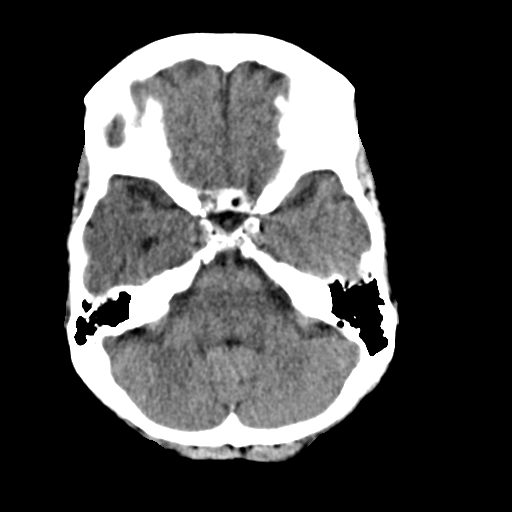
[im 9/29  brain]
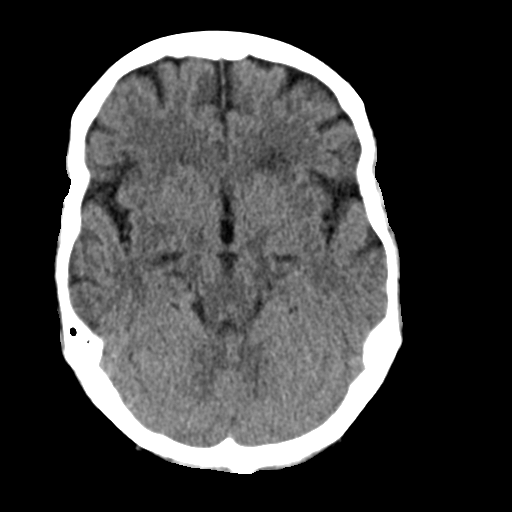
[im 12/29  brain]
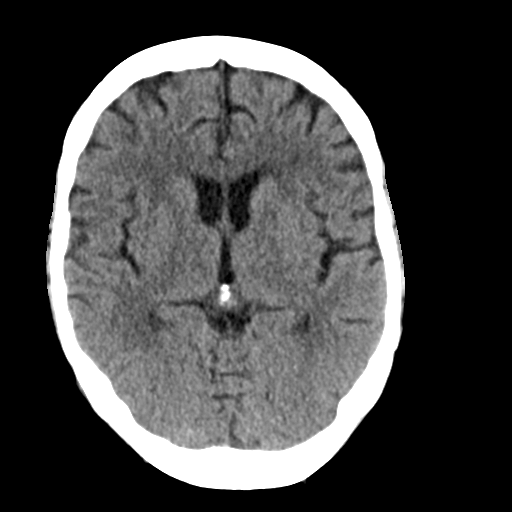
[im 15/29  brain]
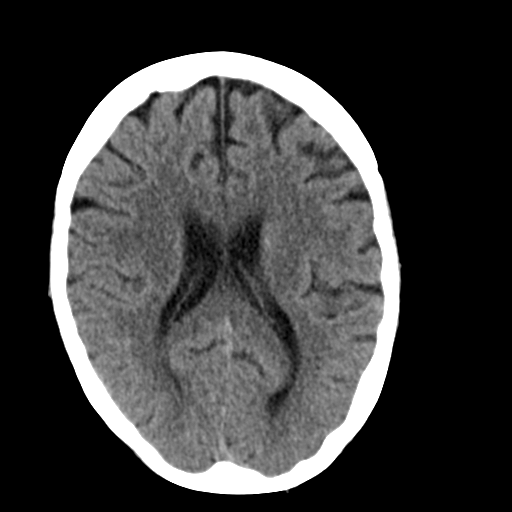
[im 15/29  bone]
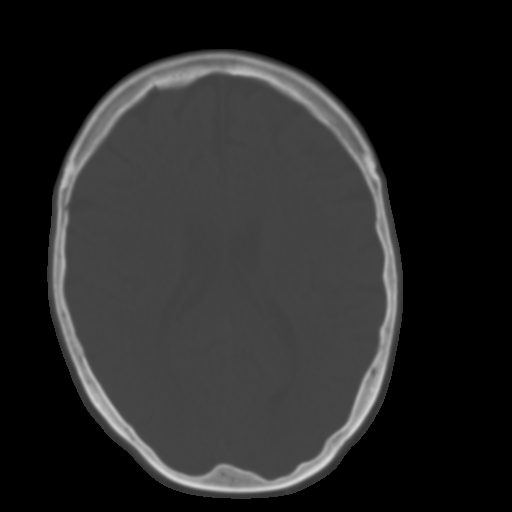
[im 18/29  brain]
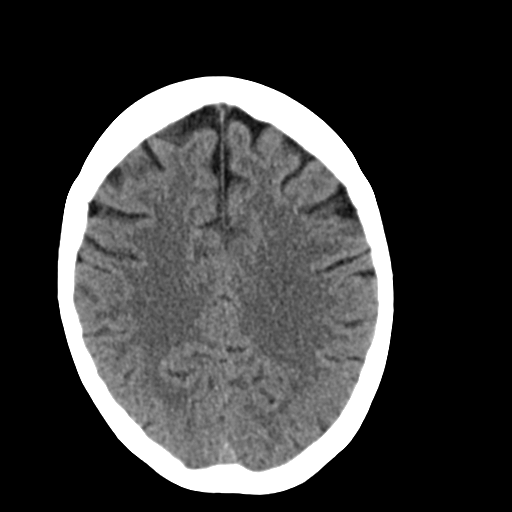
[im 21/29  brain]
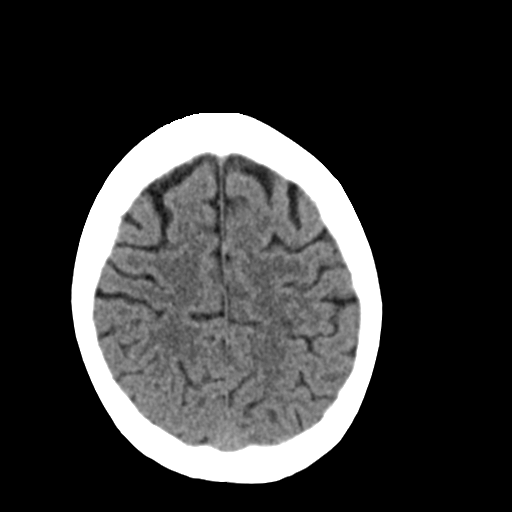
[im 24/29  brain]
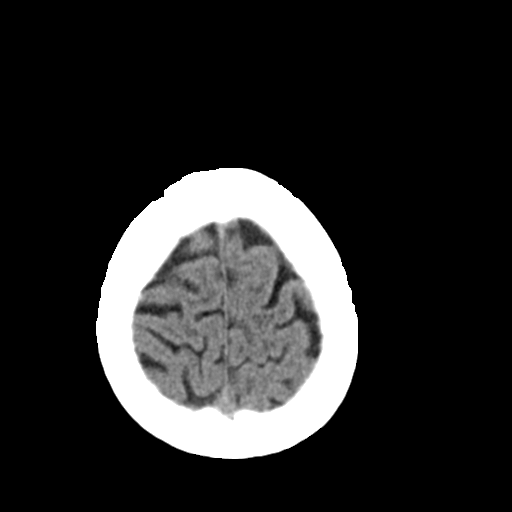
[im 27/29  brain]
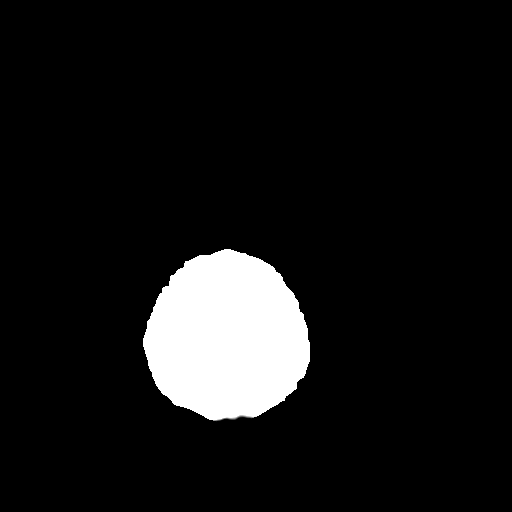
[im 27/29  bone]
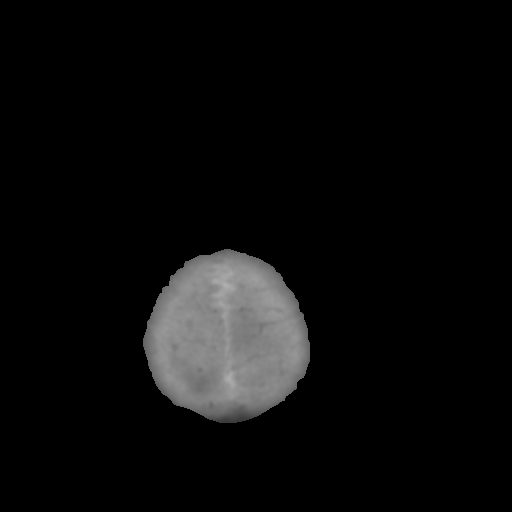

[Series 4: coronal soft tissue · coronal · 0.30mm/px · 3 of 65 slices shown]
[im 22/65  brain]
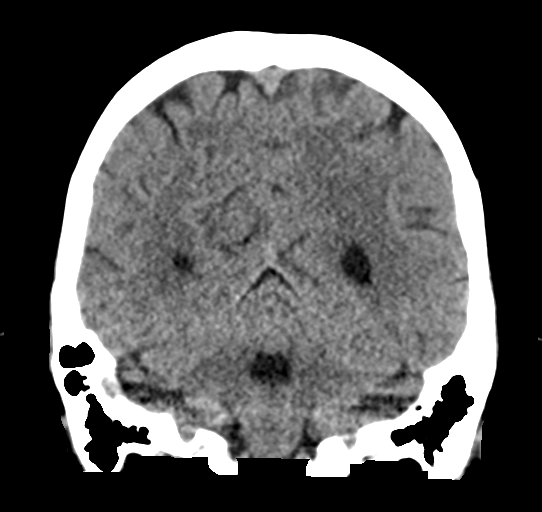
[im 29/65  brain]
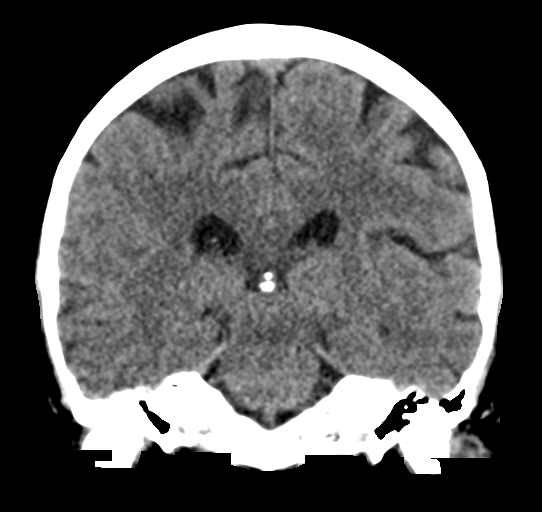
[im 36/65  brain]
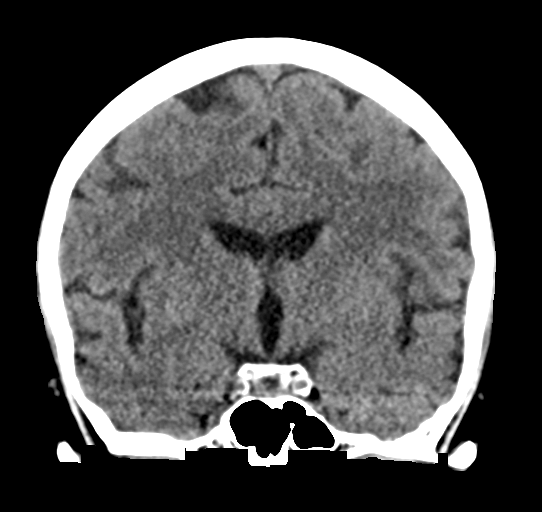

[Series 5: sagittal soft tissue · sagittal · 0.32mm/px · 3 of 51 slices shown]
[im 17/51  brain]
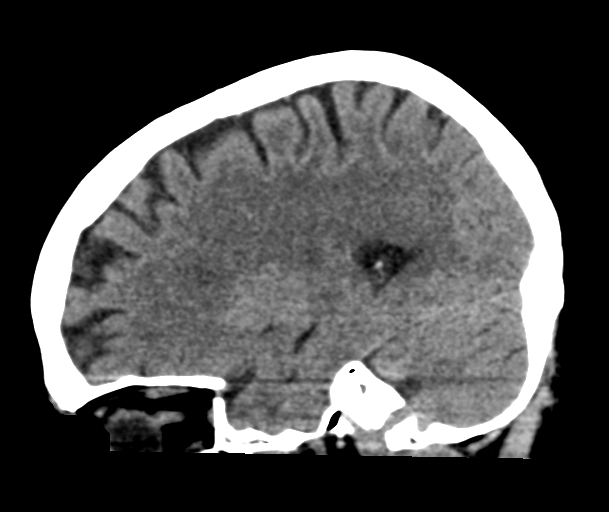
[im 26/51  brain]
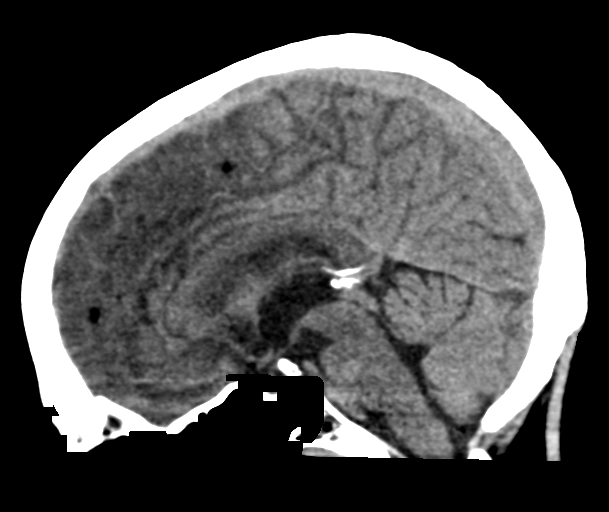
[im 34/51  brain]
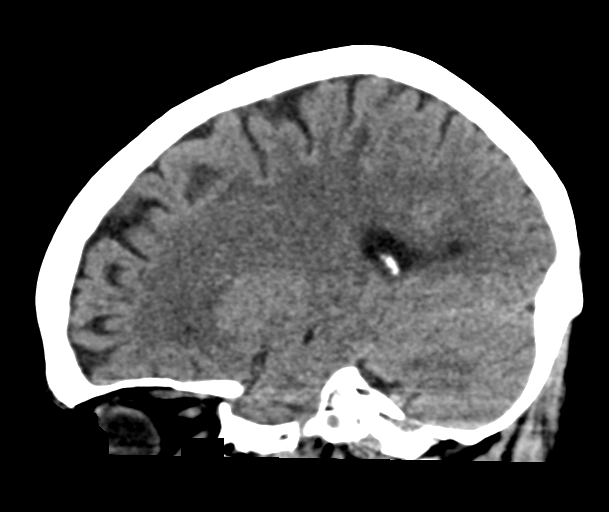

[15 of 46 positions shown; findings below may reference images not displayed]

FINDINGS: Brain: Ventricles and sulci are normal in size and configuration.
There is no intracranial mass, hemorrhage, extra-axial fluid
collection, or midline shift. There is mild decreased attenuation in
the anterior centra semiovale bilaterally as well as in the anterior
limbs of the left internal and external capsule. Elsewhere brain
parenchyma appears unremarkable. No acute infarct evident.

Vascular: No hyperdense vessel. There is mild calcification in each
carotid siphon region.

Skull: The bony calvarium appears intact.

Sinuses/Orbits: There is mild mucosal thickening in several ethmoid
air cells. Other visualized paranasal sinuses are clear. Visualized
orbits appear symmetric bilaterally.

Other: Mastoid air cells are clear.
IMPRESSION: Decreased attenuation in the anterior periventricular white matter
and in portions of the anterior limbs of the internal and external
capsules. Suspect small vessel vascular disease as most likely.
Diplopia does raise concern for possible underlying demyelination.
No acute appearing infarct. The appearance of the brain parenchyma
is stable compared to previous study. No mass or hemorrhage.

Mild arterial vascular calcification noted. Mild mucosal thickening
noted in several ethmoid air cells.

## 2020-04-25 MED ORDER — INSULIN DETEMIR 100 UNIT/ML ~~LOC~~ SOLN
10.0000 [IU] | Freq: Every day | SUBCUTANEOUS | Status: DC
  Filled 2020-04-25: qty 0.1

## 2020-04-25 MED ORDER — INSULIN ASPART 100 UNIT/ML ~~LOC~~ SOLN
0.0000 [IU] | Freq: Three times a day (TID) | SUBCUTANEOUS | Status: DC
  Administered 2020-04-26: 2 [IU] via SUBCUTANEOUS
  Filled 2020-04-25: qty 1

## 2020-04-25 MED ORDER — SENNOSIDES-DOCUSATE SODIUM 8.6-50 MG PO TABS: 1.0000 | ORAL_TABLET | Freq: Every evening | ORAL | Status: DC | PRN

## 2020-04-25 MED ORDER — FUROSEMIDE 40 MG PO TABS
20.0000 mg | ORAL_TABLET | Freq: Every day | ORAL | Status: DC
  Administered 2020-04-26: 20 mg via ORAL
  Filled 2020-04-25: qty 1

## 2020-04-25 MED ORDER — BUPROPION HCL ER (XL) 150 MG PO TB24
450.0000 mg | ORAL_TABLET | Freq: Every day | ORAL | Status: DC
  Administered 2020-04-26: 450 mg via ORAL
  Filled 2020-04-25: qty 3

## 2020-04-25 MED ORDER — ESTRADIOL 0.1 MG/GM VA CREA
1.0000 | TOPICAL_CREAM | VAGINAL | Status: DC
  Filled 2020-04-25: qty 42.5

## 2020-04-25 MED ORDER — ACETAMINOPHEN 325 MG PO TABS
650.0000 mg | ORAL_TABLET | ORAL | Status: DC | PRN
  Filled 2020-04-25: qty 2

## 2020-04-25 MED ORDER — PRAMIPEXOLE DIHYDROCHLORIDE 1 MG PO TABS
1.0000 mg | ORAL_TABLET | Freq: Every day | ORAL | Status: DC
  Filled 2020-04-25 (×2): qty 1

## 2020-04-25 MED ORDER — RISPERIDONE 1 MG PO TABS
3.0000 mg | ORAL_TABLET | Freq: Every day | ORAL | Status: DC
  Administered 2020-04-25: 3 mg via ORAL
  Filled 2020-04-25: qty 3

## 2020-04-25 MED ORDER — AMOXICILLIN-POT CLAVULANATE 875-125 MG PO TABS
1.0000 | ORAL_TABLET | Freq: Two times a day (BID) | ORAL | Status: DC
  Administered 2020-04-25 – 2020-04-26 (×2): 1 via ORAL
  Filled 2020-04-25 (×2): qty 1

## 2020-04-25 MED ORDER — ENOXAPARIN SODIUM 40 MG/0.4ML ~~LOC~~ SOLN
40.0000 mg | SUBCUTANEOUS | Status: DC
  Administered 2020-04-25: 40 mg via SUBCUTANEOUS
  Filled 2020-04-25: qty 0.4

## 2020-04-25 MED ORDER — SERTRALINE HCL 50 MG PO TABS
150.0000 mg | ORAL_TABLET | Freq: Every day | ORAL | Status: DC
  Administered 2020-04-26: 150 mg via ORAL
  Filled 2020-04-25: qty 3

## 2020-04-25 MED ORDER — INSULIN ASPART 100 UNIT/ML ~~LOC~~ SOLN: 0.0000 [IU] | Freq: Every day | SUBCUTANEOUS | Status: DC

## 2020-04-25 MED ORDER — ASPIRIN 81 MG PO CHEW
81.0000 mg | CHEWABLE_TABLET | Freq: Once | ORAL | Status: AC
  Administered 2020-04-25: 81 mg via ORAL
  Filled 2020-04-25: qty 1

## 2020-04-25 MED ORDER — ALPRAZOLAM 0.25 MG PO TABS
0.5000 mg | ORAL_TABLET | Freq: Four times a day (QID) | ORAL | Status: DC | PRN
  Administered 2020-04-26: 0.5 mg via ORAL
  Filled 2020-04-25: qty 1

## 2020-04-25 MED ORDER — ACETAMINOPHEN 500 MG PO TABS
1000.0000 mg | ORAL_TABLET | Freq: Once | ORAL | Status: AC
  Administered 2020-04-25: 1000 mg via ORAL
  Filled 2020-04-25: qty 2

## 2020-04-25 MED ORDER — FERROUS SULFATE 325 (65 FE) MG PO TABS
325.0000 mg | ORAL_TABLET | Freq: Two times a day (BID) | ORAL | Status: DC
  Filled 2020-04-25 (×2): qty 1

## 2020-04-25 MED ORDER — ACETAMINOPHEN 160 MG/5ML PO SOLN
650.0000 mg | ORAL | Status: DC | PRN
  Filled 2020-04-25: qty 20.3

## 2020-04-25 MED ORDER — STROKE: EARLY STAGES OF RECOVERY BOOK: Freq: Once | Status: DC

## 2020-04-25 MED ORDER — FOSFOMYCIN TROMETHAMINE 3 G PO PACK: 3.0000 g | PACK | Freq: Once | ORAL | Status: DC

## 2020-04-25 MED ORDER — LAMOTRIGINE 100 MG PO TABS
400.0000 mg | ORAL_TABLET | Freq: Every day | ORAL | Status: DC
  Administered 2020-04-25: 400 mg via ORAL
  Filled 2020-04-25: qty 4

## 2020-04-25 MED ORDER — LOPERAMIDE HCL 2 MG PO CAPS: 2.0000 mg | ORAL_CAPSULE | ORAL | Status: DC | PRN

## 2020-04-25 MED ORDER — ACETAMINOPHEN 650 MG RE SUPP: 650.0000 mg | RECTAL | Status: DC | PRN

## 2020-04-25 NOTE — H&P (Addendum)
History and Physical    Amy Hansen IWL:[id number] DOB: [birth year] DOA: 04/25/2020  PCP: Volney American, PA-C  Patient coming from: Home, husband at bedside  I have personally briefly reviewed patient's old medical records in [place]  Chief Complaint: worsening weakness and diplopia  HPI: Amy Hansen is a 64 y.o. female with medical history significant for chronic UTI on chronic prophylactic Keflex, hypertension, migraine, insulin-dependent type 2 diabetes, chronic diastolic heart failure, GERD, restless leg syndrome, OCD who presents with worsening weakness and diplopia.  Patient recently admitted 9/23-9/26 for persistent diarrhea for a month with CT abdomen showing pancolitis.  Patient was treated with IV Zosyn in the hospital and was discharged on Augmentin.  Also has history of chronic UTI previously on Keflex.  Her urine culture grew Enterobacter aeruginosa that was resistant to Keflex and Zosyn.  She was given a dose of fosfomycin in the hospital and was discharged to take home another dose on 9/29.  Her hospital course was also complicated by acute anemia with a hemoglobin drop of 6.9 from 7.3 on admission and she was given 2 units of PRBC with discharge hemoglobin of 9.5.  FOBT was positive and GI was consulted and thinks that it could be due to her Excedrin use daily for headache which can also contribute to her chronic diarrhea and chronic GI bleed.  An outpatient GI follow-up was recommended for outpatient EGD and colonoscopy after completion of her antibiotics.  About 2 days prior to discharge she has felt worsening bilateral lower extremity weakness and required the assistance of a walker to ambulate which she did not before.  She also noticed some new left-sided upper extremity weakness and bilateral hand tremors.  When she returned home following discharge she was watching TV and  noted a brief episode of diplopia that has since resolved.  Here in the ER she has  also noted new left-sided facial numbness and tingling around her eyes and cheek.  Has been also endorsing slurred speech that comes and goes but usually is only when she is tired. She continues to have her diarrhea poor p.o. intake since discharge.  ED Course: She was afebrile, intermittently hypertensive at times up to 160s over 90s.  No leukocytosis, microcytic anemia stable at 10.9 from 9.4 at discharge from the hospital yesterday.  Platelets continue to be elevated up to 675.  Other electrolytes and creatinine remained stable.  PCR test negative.  CT head shows "decreased attenuation in the anterior periventricular white matter and in portions of the anterior limbs of the internal and external capsule suspicious of small vessel vascular disease ."However there is concerns that her diplopia could be possible for underlying demyelination.  Tele-neuro recommendations pending at time of admission.  Review of Systems: Constitutional: No Weight Change, No Fever ENT/Mouth: No sore throat, No Rhinorrhea Eyes: No Eye Pain, No Vision Changes Cardiovascular: No Chest Pain, no SOB Respiratory: No Cough, No Sputum, No Wheezing, no Dyspnea  Gastrointestinal: No Nausea, No Vomiting, + Diarrhea, No Constipation, No Pain Genitourinary: no Urinary Incontinence Musculoskeletal: No Arthralgias, No Myalgias Skin: No Skin Lesions, No Pruritus, Neuro: + Weakness, No Numbness,  No Loss of Consciousness, No Syncope Psych: No Anxiety/Panic, No Depression, no decrease appetite Heme/Lymph: No Bruising, No Bleeding   Past Medical History:  Diagnosis Date  . Anemia   . Anxiety   . Chronic kidney disease    UTI, hematuria in urine  . Depression   . Diabetes ([place])   .  Diverticulosis   . Frequent headaches   . Interstitial cystitis   . Recurrent UTI   . Restless leg syndrome   . Urinary frequency     Past Surgical History:  Procedure Laterality Date  . ABDOMINAL HYSTERECTOMY    . bariatric bypass   2012  . CARPAL TUNNEL RELEASE Right 2003  . CARPAL TUNNEL RELEASE Right    2008  . CHOLECYSTECTOMY  1975  . CYSTOSCOPY W/ RETROGRADES Bilateral 06/06/2015   Procedure: CYSTOSCOPY WITH RETROGRADE PYELOGRAM;  Surgeon: Festus Aloe, MD;  Location: [facility] ORS;  Service: Urology;  Laterality: Bilateral;  . FL INJ LEFT KNEE CT ARTHROGRAM ([facility] HX) Left    1995  . GASTRIC BYPASS  2010  . [place]  2013  . KNEE ARTHROSCOPY Left 1996  . TONSILLECTOMY       reports that she quit smoking about 45 years ago. Her smoking use included cigarettes. She smoked 0.50 packs per day. She has never used smokeless tobacco. She reports that she does not drink alcohol and does not use drugs. Social History  Allergies  Allergen Reactions  . Avelox [Moxifloxacin Hcl In Nacl] Anaphylaxis  . Bactrim [Sulfamethoxazole-Trimethoprim] Anaphylaxis  . Ciprofloxacin Other (See Comments)    Pt states she was told never to take this as it is in the same family as Avelox.   . Depakote [Divalproex Sodium]   . Imitrex [Sumatriptan] Other (See Comments)    Neck and shoulder pain  . Stadol [Butorphanol] Rash    Family History  Problem Relation Age of Onset  . Stroke Father   . Colon cancer Mother   . Heart failure Sister   . Bladder Cancer Neg Hx   . Kidney disease Neg Hx   . Prostate cancer Neg Hx   . Kidney cancer Neg Hx      Prior to Admission medications   Medication Sig Start Date End Date Taking? Authorizing Provider  ALPRAZolam Duanne Moron) 0.5 MG tablet Take 1 tablet (0.5 mg total) by mouth 4 (four) times daily as needed for anxiety. 01/26/20  Yes Donnal Moat T, PA-C  amoxicillin-clavulanate (AUGMENTIN) 875-125 MG tablet Take 1 tablet by mouth every 12 (twelve) hours for 5 days. 04/24/20 04/29/20 Yes Lorella Nimrod, MD  buPROPion (WELLBUTRIN XL) 150 MG 24 hr tablet Take 3 tablets (450 mg total) by mouth daily. 12/16/19  Yes Donnal Moat T, PA-C  estradiol (ESTRACE VAGINAL) 0.1 MG/GM vaginal cream  Apply 0.59m (pea-sized amount)  just inside the vaginal introitus with a finger-tip on Monday, Wednesday and Friday nights. 04/07/18  Yes McGowan, SLarene BeachA, PA-C  ferrous sulfate 325 (65 FE) MG EC tablet Take 1 tablet (325 mg total) by mouth 2 (two) times daily. 04/24/20 04/24/21 Yes ALorella Nimrod MD  fosfomycin (MONUROL) 3 g PACK Take 3 g by mouth once for 1 dose. 04/27/20 04/27/20 Yes ALorella Nimrod MD  furosemide (LASIX) 20 MG tablet Take 1 tablet (20 mg total) by mouth daily. 07/29/19  Yes End, CHarrell Gave MD  glipiZIDE (GLUCOTROL) 5 MG tablet TAKE 1 TABLET BY MOUTH TWICE DAILY WITH MEALS (APPOINTMENT  REQUIRED  FOR  FUTURE  REFILLS) Patient taking differently: Take 5 mg by mouth 2 (two) times daily before a meal. TAKE 1 TABLET BY MOUTH TWICE DAILY WITH MEALS (APPOINTMENT  REQUIRED  FOR  FUTURE  REFILLS) 04/14/20  Yes LVolney American PA-C  lamoTRIgine (LAMICTAL) 200 MG tablet TAKE 2 TABLETS BY MOUTH AT BEDTIME Patient taking differently: Take 400 mg by mouth at bedtime.  03/14/20  Yes Hurst, Helene Kelp T, PA-C  metFORMIN (GLUCOPHAGE) 1000 MG tablet TAKE 1 TABLET BY MOUTH TWICE DAILY WITH MEALS . APPOINTMENT REQUIRED FOR FUTURE REFILLS Patient taking differently: Take 1,000 mg by mouth 2 (two) times daily with a meal. TAKE 1 TABLET BY MOUTH TWICE DAILY WITH MEALS . APPOINTMENT REQUIRED FOR FUTURE REFILLS 04/14/20  Yes Volney American, PA-C  Multiple Vitamin (MULTIVITAMIN WITH MINERALS) TABS tablet Take 1 tablet by mouth daily. 04/25/20  Yes Lorella Nimrod, MD  pramipexole (MIRAPEX) 1 MG tablet Take 1 tablet (1 mg total) by mouth at bedtime. 12/20/19  Yes Volney American, PA-C  risperiDONE (RISPERDAL) 3 MG tablet TAKE 1 TABLET BY MOUTH EVERY DAY AT BEDTIME Patient taking differently: Take 3 mg by mouth at bedtime.  02/16/20  Yes Donnal Moat T, PA-C  sertraline (ZOLOFT) 100 MG tablet Take 1.5 tablets (150 mg total) by mouth daily. 12/16/19  Yes Hurst, Teresa T, PA-C  insulin glargine, 2 Unit  Dial, (TOUJEO MAX SOLOSTAR) 300 UNIT/ML Solostar Pen Inject 24 units daily. Titrate as instructed. Max daily dose 40 units 01/20/20   Volney American, PA-C  Insulin Pen Needle (PEN NEEDLES) 32G X 4 MM MISC 1 Units by Does not apply route at bedtime. 12/20/19   Volney American, PA-C  loperamide (IMODIUM) 2 MG capsule Take 1 capsule (2 mg total) by mouth as needed for diarrhea or loose stools. 04/24/20   Lorella Nimrod, MD  promethazine (PHENERGAN) 25 MG tablet Take 1 tablet (25 mg total) by mouth every 8 (eight) hours as needed for nausea or vomiting. Patient not taking: Reported on 04/25/2020 09/11/19   Volney American, [place]    Physical Exam: Vitals:   04/25/20 1900 04/25/20 1930 04/25/20 2000 04/25/20 2030  BP: (!) 159/96 (!) 162/98 (!) 167/96 (!) 154/89  Pulse: 85 83 92 98  Resp: 19 (!) 21 (!) 21 (!) 22  Temp:      TempSrc:      SpO2: 98% 99% 97% 98%  Weight:      Height:        Constitutional: NAD, calm, comfortable, elderly female laying flat in bed Vitals:   04/25/20 1900 04/25/20 1930 04/25/20 2000 04/25/20 2030  BP: (!) 159/96 (!) 162/98 (!) 167/96 (!) 154/89  Pulse: 85 83 92 98  Resp: 19 (!) 21 (!) 21 (!) 22  Temp:      TempSrc:      SpO2: 98% 99% 97% 98%  Weight:      Height:       Eyes: PERRL, lids and conjunctivae normal ENMT: Mucous membranes are moist.  Neck: normal, supple Respiratory: clear to auscultation bilaterally, no wheezing, no crackles. Normal respiratory effort. No accessory muscle use.  Cardiovascular: Regular rate and rhythm, no murmurs / rubs / gallops. No extremity edema. 2+ pedal pulses. No carotid bruits.  Abdomen: no tenderness, no masses palpated.  Bowel sounds positive.  Musculoskeletal: no clubbing / cyanosis. No joint deformity upper and lower extremities. Good ROM, no contractures. Normal muscle tone.  Skin: no rashes, lesions, ulcers. No induration Neurologic: CN 2-12 grossly intact. Sensation intact, DTR normal. Strength 5/5  in all 4 although lower extremity began shaking following strength testing.  Also had intentional tremors in bilateral hands.  Intact finger-nose.  Intact heel-to-shin. Psychiatric: Normal judgment and insight. Alert and oriented x 3. Normal mood.    Labs on Admission: I have personally reviewed following labs and imaging studies  CBC: Recent Labs  Lab  04/21/20 1503 04/21/20 1523 04/22/20 0515 04/22/20 0515 04/22/20 5809 04/23/20 0603 04/24/20 0034 04/24/20 0416 04/25/20 1201  WBC 17.8*   < > 12.2*  --  11.6* 7.2  --  4.4 5.5  NEUTROABS 15.5*  --   --   --   --   --   --   --  3.9  HGB 8.2*   < > 7.3*   < > 7.1* 6.9* 9.5* 9.4* 10.9*  HCT 27.7*   < > 23.5*   < > 23.0* 21.9* 29.3* 27.9* 34.3*  MCV 66*   < > 64.0*  --  63.4* 63.5*  --  65.0* 69.2*  PLT 567*   < > 668*  --  676* 639*  --  574* 675*   < > = values in this interval not displayed.   Basic Metabolic Panel: Recent Labs  Lab 04/21/20 1523 04/21/20 2114 04/22/20 0515 04/22/20 0515 04/22/20 0833 04/22/20 1416 04/23/20 0603 04/24/20 0416 04/25/20 1201  NA 125*   < > 135   < > 134* 134* 133* 135 135  K 2.7*  --   --   --  2.6*  --  3.7 3.0* 3.6  CL 90*  --   --   --  99  --  99 96* 97*  CO2 20*  --   --   --  26  --  26 28 26   GLUCOSE 219*  --   --   --  68*  --  137* 112* 143*  BUN 14  --   --   --  5*  --  <5* <5* <5*  CREATININE 1.39*  --   --   --  0.57  --  0.47 0.48 0.55  CALCIUM 8.1*  --   --   --  7.7*  --  8.0* 7.9* 8.3*  MG  --   --  1.7  --   --   --  1.7 1.7  --    < > = values in this interval not displayed.   GFR: Estimated Creatinine Clearance: 57.7 mL/min (by C-G formula based on SCr of 0.55 mg/dL). Liver Function Tests: Recent Labs  Lab 04/21/20 1523 04/25/20 1201  AST 21 15  ALT 14 13  ALKPHOS 165* 176*  BILITOT 0.5 0.5  PROT 6.7 6.3*  ALBUMIN 2.4* 2.3*   Recent Labs  Lab 04/21/20 1523  LIPASE 18   No results for input(s): AMMONIA in the last 168 hours. Coagulation  Profile: Recent Labs  Lab 04/25/20 1201  INR 0.9   Cardiac Enzymes: No results for input(s): CKTOTAL, CKMB, CKMBINDEX, TROPONINI in the last 168 hours. BNP (last 3 results) No results for input(s): PROBNP in the last 8760 hours. HbA1C: No results for input(s): HGBA1C in the last 72 hours. CBG: Recent Labs  Lab 04/23/20 1647 04/23/20 2106 04/24/20 0745 04/24/20 1123 04/25/20 1200  GLUCAP 127* 145* 182* 187* 149*   Lipid Profile: No results for input(s): CHOL, HDL, LDLCALC, TRIG, CHOLHDL, LDLDIRECT in the last 72 hours. Thyroid Function Tests: No results for input(s): TSH, T4TOTAL, FREET4, T3FREE, THYROIDAB in the last 72 hours. Anemia Panel: No results for input(s): VITAMINB12, FOLATE, FERRITIN, TIBC, IRON, RETICCTPCT in the last 72 hours. Urine analysis:    Component Value Date/Time   COLORURINE YELLOW (A) 04/21/2020 1747   APPEARANCEUR CLOUDY (A) 04/21/2020 1747   APPEARANCEUR Clear 12/14/2019 1040   LABSPEC 1.011 04/21/2020 1747   LABSPEC 1.003 01/09/2012 2126   PHURINE  5.0 04/21/2020 1747   GLUCOSEU NEGATIVE 04/21/2020 1747   GLUCOSEU Negative 01/09/2012 2126   HGBUR NEGATIVE 04/21/2020 [place] 04/21/2020 1747   BILIRUBINUR Negative 12/14/2019 1040   BILIRUBINUR Negative 01/09/2012 2126   KETONESUR NEGATIVE 04/21/2020 1747   PROTEINUR NEGATIVE 04/21/2020 1747   NITRITE POSITIVE (A) 04/21/2020 1747   LEUKOCYTESUR LARGE (A) 04/21/2020 1747   LEUKOCYTESUR Negative 01/09/2012 2126    Radiological Exams on Admission: CT HEAD WO CONTRAST  Result Date: 04/25/2020 CLINICAL DATA:  Dizziness and diplopia EXAM: CT HEAD WITHOUT CONTRAST TECHNIQUE: Contiguous axial images were obtained from the base of the skull through the vertex without intravenous contrast. COMPARISON:  Dec 14, 2019 FINDINGS: Brain: Ventricles and sulci are normal in size and configuration. There is no intracranial mass, hemorrhage, extra-axial fluid collection, or midline shift.  There is mild decreased attenuation in the anterior centra semiovale bilaterally as well as in the anterior limbs of the left internal and external capsule. Elsewhere brain parenchyma appears unremarkable. No acute infarct evident. Vascular: No hyperdense vessel. There is mild calcification in each carotid siphon region. Skull: The bony calvarium appears intact. Sinuses/Orbits: There is mild mucosal thickening in several ethmoid air cells. Other visualized paranasal sinuses are clear. Visualized orbits appear symmetric bilaterally. Other: Mastoid air cells are clear. IMPRESSION: Decreased attenuation in the anterior periventricular white matter and in portions of the anterior limbs of the internal and external capsules. 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 Diplopia does raise concern for possible underlying demyelination. No acute appearing infarct. The appearance of the brain parenchyma is stable compared to previous study. No mass or hemorrhage. Mild arterial vascular calcification noted. Mild mucosal thickening noted in several ethmoid air cells. Electronically Signed   By: Lowella Grip III M.D.   On: 04/25/2020 12:26      Assessment/Plan  Weakness/Diplopia Majority of her weakness I suspect is coming from her persistent diarrhea and decrease PO intake for the last month.  However that does not explain her acute episode of diplopia and some left-sided facial numbness. CT of the head show "decreased attenuation in the anterior periventricular white matter and in portions of the anterior limb of the internal and external capsule.  Suspect small vascular disease but difficulty also raise concerns of underlying demyelination" Pt overall more tremulous on exam compare to when I evaluated her for her last admission a few days ago. No focal neurology deficits.  Appreciate Tele-neuro for further recommendations  Hx of recent pancolitis Continue Augmentin  Hx of recent UTI Need  second dose of fosfomycin on 9/29   Microcytic anemia stable Has hx of gastric bypass Required 2 units of pRBC transfusion in last admission that GI thinks is due to chronic GI bleed from chronic Excedrin use Continue iron supplement   Insulin-dependent type 2 diabetes HbA1C of 7.7 in september  At home she is on glargine 24 units,glipizide, metformin start with 10 units Levermir qHS and sensitive SSI    OCD Continue bupropion, Risperdal, Lamictal, Zoloft   Restless leg syndrome Continue Pramipexole    Status is: Observation  The patient remains OBS appropriate and will d/c before 2 midnights.  Dispo: The patient is from: Home              Anticipated d/c is to: Home              Anticipated d/c date is: 1 day  Patient currently is not medically stable to d/c.         Orene Desanctis DO [facility]   If 7PM-7AM, please contact night-coverage [email or web address]   04/25/2020, 9:16 PM

## 2020-04-25 NOTE — Telephone Encounter (Signed)
Agree with disposition. 

## 2020-04-25 NOTE — ED Triage Notes (Signed)
Pt presents to ED via [facility] with c/o dizziness and increasing weakness. Pt states has been unable to walk without assistance. Pt states has been using a walker at home to get around which is not at baseline. Per First RN Note pt dx with UTI yesterday and started her abx today. Pt states dizziness started on Saturday.   Pt with sudden onset slower speech and word seeking in triage that resolved, pt with L sided facial droop noted in triage, pt with numbness to L side face/hand/leg, L hand grip weaker. Pt states numbness/tingling since last week. Pt with noted intermittent confusion, stating symptoms started Monday, this RN informed patient it is Monday, pt appears confused. Pt disoriented to month, oriented to year, place, situation, and self.

## 2020-04-25 NOTE — ED Provider Notes (Signed)
[facility] Emergency Department Provider Note   ____________________________________________   First MD Initiated Contact with Patient 04/25/20 1656     (approximate)  I have reviewed the triage vital signs and the nursing notes.   HISTORY  Chief Complaint Weakness and Dizziness    HPI Amy Hansen is a 64 y.o. female with a past medical history of diabetes, anxiety, and CKD who presents for multiple symptoms including ataxia, generalized weakness, left upper extremity numbness, difficulty word finding, and diplopia that occurred within the last 3 days. Patient states that her generalized weakness and and ambulatory dysfunction began approximately 48 hours ago. Patient states that she has had to walk with a walker in the last 24 hours has been unable to ambulate. Patient states that her left upper extremity numbness began approximately 24 hours prior to arrival and has somewhat decreased. Patient states that she started having diplopia approximately 24 hours ago that resolved spontaneously in less than 5 minutes. Patient states that she started having difficulty word finding this morning and has been stable since onset. Patient denies any other complaints at this time.         Past Medical History:  Diagnosis Date  . Anemia   . Anxiety   . Chronic kidney disease    UTI, hematuria in urine  . Depression   . Diabetes ([place])   . Diverticulosis   . Frequent headaches   . Interstitial cystitis   . Recurrent UTI   . Restless leg syndrome   . Urinary frequency     Patient Active Problem List   Diagnosis Date Noted  . Dysarthria 04/25/2020  . Diarrhea 04/21/2020  . Left lower quadrant abdominal pain 04/21/2020  . Colitis 04/21/2020  . Severe sepsis ([place]) 04/21/2020  . AKI (acute kidney injury) ([place]) 04/21/2020  . Hypokalemia 04/21/2020  . Hyponatremia 04/21/2020  . Anemia 04/21/2020  . Major depression, chronic 06/01/2018  . OCD (obsessive  compulsive disorder) 06/01/2018  . Chronic heart failure with preserved ejection fraction ([place]) 05/16/2018  . Left leg pain 11/04/2017  . Atypical chest pain 03/05/2017  . GERD (gastroesophageal reflux disease) 02/18/2017  . Iron deficiency anemia 10/18/2016  . Foot swelling 07/17/2016  . Hypertension 03/21/2016  . Vitamin D deficiency 02/21/2016  . B12 deficiency 02/21/2016  . Incontinent of feces 02/21/2016  . Poorly controlled type 2 diabetes mellitus ([place]) 09/13/2015  . Dysuria 06/14/2015  . Migraines 05/23/2015  . Restless legs syndrome (RLS) 05/23/2015  . Recurrent UTI 04/26/2015  . Atrophic vaginitis 04/26/2015  . Dyspareunia, female 04/26/2015    Past Surgical History:  Procedure Laterality Date  . ABDOMINAL HYSTERECTOMY    . bariatric bypass  2012  . CARPAL TUNNEL RELEASE Right 2003  . CARPAL TUNNEL RELEASE Right    2008  . CHOLECYSTECTOMY  1975  . CYSTOSCOPY W/ RETROGRADES Bilateral 06/06/2015   Procedure: CYSTOSCOPY WITH RETROGRADE PYELOGRAM;  Surgeon: Festus Aloe, MD;  Location: [facility] ORS;  Service: Urology;  Laterality: Bilateral;  . FL INJ LEFT KNEE CT ARTHROGRAM ([facility] HX) Left    1995  . GASTRIC BYPASS  2010  . [place]  2013  . KNEE ARTHROSCOPY Left 1996  . TONSILLECTOMY      Prior to Admission medications   Medication Sig Start Date End Date Taking? Authorizing Provider  ALPRAZolam Duanne Moron) 0.5 MG tablet Take 1 tablet (0.5 mg total) by mouth 4 (four) times daily as needed for anxiety. 01/26/20  Yes Addison Lank, PA-C  amoxicillin-clavulanate (AUGMENTIN) 875-125 MG tablet Take 1 tablet by mouth every 12 (twelve) hours for 5 days. 04/24/20 04/29/20 Yes Lorella Nimrod, MD  buPROPion (WELLBUTRIN XL) 150 MG 24 hr tablet Take 3 tablets (450 mg total) by mouth daily. 12/16/19  Yes Donnal Moat T, PA-C  estradiol (ESTRACE VAGINAL) 0.1 MG/GM vaginal cream Apply 0.59m (pea-sized amount)  just inside the vaginal introitus with a finger-tip on Monday,  Wednesday and Friday nights. 04/07/18  Yes McGowan, SLarene BeachA, PA-C  ferrous sulfate 325 (65 FE) MG EC tablet Take 1 tablet (325 mg total) by mouth 2 (two) times daily. 04/24/20 04/24/21 Yes ALorella Nimrod MD  fosfomycin (MONUROL) 3 g PACK Take 3 g by mouth once for 1 dose. 04/27/20 04/27/20 Yes ALorella Nimrod MD  furosemide (LASIX) 20 MG tablet Take 1 tablet (20 mg total) by mouth daily. 07/29/19  Yes End, CHarrell Gave MD  glipiZIDE (GLUCOTROL) 5 MG tablet TAKE 1 TABLET BY MOUTH TWICE DAILY WITH MEALS (APPOINTMENT  REQUIRED  FOR  FUTURE  REFILLS) Patient taking differently: Take 5 mg by mouth 2 (two) times daily before a meal. TAKE 1 TABLET BY MOUTH TWICE DAILY WITH MEALS (APPOINTMENT  REQUIRED  FOR  FUTURE  REFILLS) 04/14/20  Yes LVolney American PA-C  lamoTRIgine (LAMICTAL) 200 MG tablet TAKE 2 TABLETS BY MOUTH AT BEDTIME Patient taking differently: Take 400 mg by mouth at bedtime.  03/14/20  Yes Hurst, THelene KelpT, PA-C  metFORMIN (GLUCOPHAGE) 1000 MG tablet TAKE 1 TABLET BY MOUTH TWICE DAILY WITH MEALS . APPOINTMENT REQUIRED FOR FUTURE REFILLS Patient taking differently: Take 1,000 mg by mouth 2 (two) times daily with a meal. TAKE 1 TABLET BY MOUTH TWICE DAILY WITH MEALS . APPOINTMENT REQUIRED FOR FUTURE REFILLS 04/14/20  Yes LVolney American PA-C  Multiple Vitamin (MULTIVITAMIN WITH MINERALS) TABS tablet Take 1 tablet by mouth daily. 04/25/20  Yes ALorella Nimrod MD  pramipexole (MIRAPEX) 1 MG tablet Take 1 tablet (1 mg total) by mouth at bedtime. 12/20/19  Yes LVolney American PA-C  risperiDONE (RISPERDAL) 3 MG tablet TAKE 1 TABLET BY MOUTH EVERY DAY AT BEDTIME Patient taking differently: Take 3 mg by mouth at bedtime.  02/16/20  Yes HDonnal MoatT, PA-C  sertraline (ZOLOFT) 100 MG tablet Take 1.5 tablets (150 mg total) by mouth daily. 12/16/19  Yes Hurst, Teresa T, PA-C  insulin glargine, 2 Unit Dial, (TOUJEO MAX SOLOSTAR) 300 UNIT/ML Solostar Pen Inject 24 units daily. Titrate as  instructed. Max daily dose 40 units 01/20/20   LVolney American PA-C  Insulin Pen Needle (PEN NEEDLES) 32G X 4 MM MISC 1 Units by Does not apply route at bedtime. 12/20/19   LVolney American PA-C  loperamide (IMODIUM) 2 MG capsule Take 1 capsule (2 mg total) by mouth as needed for diarrhea or loose stools. 04/24/20   ALorella Nimrod MD  promethazine (PHENERGAN) 25 MG tablet Take 1 tablet (25 mg total) by mouth every 8 (eight) hours as needed for nausea or vomiting. Patient not taking: Reported on 04/25/2020 09/11/19   LVolney American PA-C    Allergies Avelox [moxifloxacin hcl in nacl], Bactrim [sulfamethoxazole-trimethoprim], Ciprofloxacin, Depakote [divalproex sodium], Imitrex [sumatriptan], and Stadol [butorphanol]  Family History  Problem Relation Age of Onset  . Stroke Father   . Colon cancer Mother   . Heart failure Sister   . Bladder Cancer Neg Hx   . Kidney disease Neg Hx   . Prostate cancer Neg Hx   . Kidney cancer Neg Hx  Social History Social History   Tobacco Use  . Smoking status: Former Smoker    Packs/day: 0.50    Types: Cigarettes    Quit date: 04/25/1975    Years since quitting: 45.0  . Smokeless tobacco: Never Used  . Tobacco comment: quit 40 years  Vaping Use  . Vaping Use: Never used  Substance Use Topics  . Alcohol use: No    Alcohol/week: 0.0 standard drinks  . Drug use: No    Review of Systems Constitutional: No fever/chills Eyes: No visual changes. ENT: No sore throat. Cardiovascular: Denies chest pain. Respiratory: Denies shortness of breath. Gastrointestinal: No abdominal pain.  No nausea, no vomiting.  No diarrhea. Genitourinary: Negative for dysuria. Musculoskeletal: Negative for acute arthralgias Skin: Negative for rash. Neurological: Negative for headaches, paresthesias in any extremity Psychiatric: Negative for suicidal ideation/homicidal ideation   ____________________________________________   PHYSICAL  EXAM:  VITAL SIGNS: ED Triage Vitals  Enc Vitals Group     BP 04/25/20 1143 (!) 144/82     Pulse Rate 04/25/20 1143 90     Resp 04/25/20 1143 18     Temp 04/25/20 1143 98.7 F (37.1 C)     Temp Source 04/25/20 1143 Oral     SpO2 04/25/20 1143 98 %     Weight 04/25/20 1144 125 lb (56.7 kg)     Height 04/25/20 1144 5' 2"  (1.575 m)     Head Circumference --      Peak Flow --      Pain Score 04/25/20 1149 0     Pain Loc --      Pain Edu? --      Excl. in [place]? --    Constitutional: Alert and oriented. Well appearing and in no acute distress. Eyes: Conjunctivae are normal. PERRL. Head: Atraumatic. Nose: No congestion/rhinnorhea. Mouth/Throat: Mucous membranes are moist. Neck: No stridor Cardiovascular: Grossly normal heart sounds.  Good peripheral circulation. Respiratory: Normal respiratory effort.  No retractions. Gastrointestinal: Soft and nontender. No distention. Musculoskeletal: No obvious deformities Neurologic: Patient alert and oriented x3. Difficulty word finding. Decreased sensation in left hand. Decreased strength in bilateral lower extremities Skin:  Skin is warm and dry. No rash noted. Psychiatric: Mood and affect are normal. Speech and behavior are normal.  ____________________________________________   LABS (all labs ordered are listed, but only abnormal results are displayed)  Labs Reviewed  GLUCOSE, CAPILLARY - Abnormal; Notable for the following components:      Result Value   Glucose-Capillary 149 (*)    All other components within normal limits  CBC - Abnormal; Notable for the following components:   Hemoglobin 10.9 (*)    HCT 34.3 (*)    MCV 69.2 (*)    MCH 22.0 (*)    RDW 23.1 (*)    Platelets 675 (*)    All other components within normal limits  DIFFERENTIAL - Abnormal; Notable for the following components:   Abs Immature Granulocytes 0.28 (*)    All other components within normal limits  COMPREHENSIVE METABOLIC PANEL - Abnormal; Notable for  the following components:   Chloride 97 (*)    Glucose, Bld 143 (*)    BUN <5 (*)    Calcium 8.3 (*)    Total Protein 6.3 (*)    Albumin 2.3 (*)    Alkaline Phosphatase 176 (*)    All other components within normal limits  RESPIRATORY PANEL BY RT PCR (FLU A&B, COVID)  PROTIME-INR  APTT  LIPID PANEL  CBG MONITORING,  ED   ____________________________________________  EKG  ED ECG REPORT I, Naaman Plummer, the attending physician, personally viewed and interpreted this ECG.  Date: 04/25/2020 EKG Time: 1144 Rate: 97 Rhythm: normal sinus rhythm QRS Axis: normal Intervals: normal ST/T Wave abnormalities: normal Narrative Interpretation: no evidence of acute ischemia  ____________________________________________  RADIOLOGY  ED MD interpretation: CT noncontrast of the brain shows decreased attenuation in the anterior periventricular white matter and portions of the anterior limbs of the external and internal capsules that likely represent small vessel vascular disease. No evidence of acute hemorrhage Official radiology report(s): CT HEAD WO CONTRAST  Result Date: 04/25/2020 CLINICAL DATA:  Dizziness and diplopia EXAM: CT HEAD WITHOUT CONTRAST TECHNIQUE: Contiguous axial images were obtained from the base of the skull through the vertex without intravenous contrast. COMPARISON:  Dec 14, 2019 FINDINGS: Brain: Ventricles and sulci are normal in size and configuration. There is no intracranial mass, hemorrhage, extra-axial fluid collection, or midline shift. There is mild decreased attenuation in the anterior centra semiovale bilaterally as well as in the anterior limbs of the left internal and external capsule. Elsewhere brain parenchyma appears unremarkable. No acute infarct evident. Vascular: No hyperdense vessel. There is mild calcification in each carotid siphon region. Skull: The bony calvarium appears intact. Sinuses/Orbits: There is mild mucosal thickening in several ethmoid air  cells. Other visualized paranasal sinuses are clear. Visualized orbits appear symmetric bilaterally. Other: Mastoid air cells are clear. IMPRESSION: Decreased attenuation in the anterior periventricular white matter and in portions of the anterior limbs of the internal and external capsules. 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 Diplopia does raise concern for possible underlying demyelination. No acute appearing infarct. The appearance of the brain parenchyma is stable compared to previous study. No mass or hemorrhage. Mild arterial vascular calcification noted. Mild mucosal thickening noted in several ethmoid air cells. Electronically Signed   By: Lowella Grip III M.D.   On: 04/25/2020 12:26    ____________________________________________   PROCEDURES  Procedure(s) performed (including Critical Care):  Procedures   ____________________________________________   INITIAL IMPRESSION / ASSESSMENT AND PLAN / ED COURSE  As part of my medical decision making, I reviewed the following data within the [place] notes reviewed and incorporated, Labs reviewed, EKG interpreted, Old chart reviewed, Radiograph reviewed and Notes from prior ED visits reviewed and incorporated        Patient is a 64 year old female who presents with symptoms concerning for TIA PMH risk factors: Hypertension Neurologic Deficits: Word finding issues, diplopia, ataxia, left-sided numbness Last known Well Time: 3 days prior to arrival NIH Stroke Score: 2 Given History and Exam I have lower suspicion for infectious etiology, neurologic changes secondary to toxicologic ingestion, seizure, complex migraine. Presentation concerning for possible stroke requiring workup.  Workup: Labs: POC glucose, CBC, BMP, LFTs, Troponin, PT/INR, PTT, Type and Screen Other Diagnostics: ECG, CXR, non-contrast head CT followed by CTA brain and neck (to r/o large vessel occlusion amenable to  thrombectomy) Interventions: Patient's not eligible for TPA due to resolution of symptoms prior to evaluation  Consult: hospitalist Disposition: Admit      ____________________________________________   FINAL CLINICAL IMPRESSION(S) / ED DIAGNOSES  Final diagnoses:  Generalized weakness  Left upper extremity numbness  Ataxia     ED Discharge Orders    None       Note:  This document was prepared using Dragon voice recognition software and may include unintentional dictation errors.   Naaman Plummer, MD 04/25/20  2120  

## 2020-04-25 NOTE — ED Triage Notes (Signed)
Pt in via EMS from home with c/o weakness increasing over yesterday. Pt dx'd with UTI yesterday and starting abx today, FSBS 150, pt also c/o dizziness  EMS reports neg stroke screen

## 2020-04-25 NOTE — Telephone Encounter (Signed)
Went to ER last Thursday for colitis. Discharged yesterday. Cannot walk without a walker. Incontinence of bowels and having diarrhea. Dizzy, vision double, not able to text can't write. Numbness in left hand. Having trouble talking. Advised pt to call 911. Offered to call for pt but she stated she will call. Advised pt to call as soon as we hang up.   Reason for Disposition . [1] SEVERE weakness (i.e., unable to walk or barely able to walk, requires support) AND [2] new-onset or worsening  Additional Information . Commented on: [1] Numbness (i.e., loss of sensation) of the face, arm / hand, or leg / foot on one side of the body AND [2] sudden onset AND [3] present now    Left hand numb  Answer Assessment - Initial Assessment Questions 1. SYMPTOM: "What is the main symptom you are concerned about?" (e.g., weakness, numbness)     Incontinence, double vision, not able to write or text.  2. ONSET: "When did this start?" (minutes, hours, days; while sleeping)     This am 3. LAST NORMAL: "When was the last time you were normal (no symptoms)?"     Pt was symptomatic while in hospital, pt stated that she think she had a stroke 4. PATTERN "Does this come and go, or has it been constant since it started?"  "Is it present now?"     Present now 6. NEUROLOGIC SYMPTOMS: "Have you had any of the following symptoms: headache, dizziness, vision loss, double vision, changes in speech, unsteady on your feet?"     Dizziness, double vision, changes in speech, unsteady on feet. 7. OTHER SYMPTOMS: "Do you have any other symptoms?"     Left hand numbness (left hand is pt's dominant hand, incontinent of stool  Protocols used: NEUROLOGIC DEFICIT-A-AH

## 2020-04-25 NOTE — ED Triage Notes (Signed)
This RN discussed patient care with Dr. Creig Hines, per Dr. Creig Hines, due to being unable to establish LKW with numbness/weakness/L sided facial droop, and speech difficulties intermittent, no code stroke.

## 2020-04-25 NOTE — ED Notes (Signed)
Pt requesting Tylenol r/t a headache. Dr. Cheri Fowler, MD made aware.

## 2020-04-25 NOTE — ED Notes (Signed)
Pt ambulated to bedside toilet with walker and this RN at this time. Pt tolerated well.

## 2020-04-25 NOTE — ED Notes (Addendum)
Pt presents to the ED for difficulty ambulating and confusion. Pt was admitted to the hospital on Thursday r/t low hgb and low K+, pt was given a blood transfusion and was d/c from the hospital yesterday. Per husband, pt has been having difficulty ambulating and needed to use a walker he borrowed from the pt's sister which is not normal for this pt. Also, pt has been having some confusion and numbness on the L side. Pt is alert and oriented to self, situation, location but disoriented to time. Pt does not know the day. Per husband, pt was d/c with antibiotics r/t pt's colitis

## 2020-04-25 NOTE — ED Notes (Signed)
Pt ambulatory to the restroom at this time.

## 2020-04-25 NOTE — ED Notes (Signed)
This RN to bedside at this time with telecart in place from previous shift. This RN on phone with tele neuro, states nurse manager will call this RN back about tele neuro consult.

## 2020-04-26 ENCOUNTER — Observation Stay: Payer: Self-pay

## 2020-04-26 DIAGNOSIS — G459 Transient cerebral ischemic attack, unspecified: Secondary | ICD-10-CM

## 2020-04-26 LAB — LIPID PANEL
Cholesterol: 58 mg/dL (ref 0–200)
HDL: 29 mg/dL — ABNORMAL LOW (ref >40)
LDL Cholesterol: 16 mg/dL (ref 0–99)
Total CHOL/HDL Ratio: 2 RATIO
Triglycerides: 63 mg/dL (ref <150)
VLDL: 13 mg/dL (ref 0–40)

## 2020-04-26 LAB — CALPROTECTIN, FECAL: Calprotectin, Fecal: 5964 ug/g — ABNORMAL HIGH (ref 0–120)

## 2020-04-26 LAB — CDIFF NAA+O+P+STOOL CULTURE
E coli, Shiga toxin Assay: NEGATIVE
Toxigenic C. Difficile by PCR: NEGATIVE

## 2020-04-26 LAB — GLUCOSE, CAPILLARY: Glucose-Capillary: 155 mg/dL — ABNORMAL HIGH (ref 70–99)

## 2020-04-26 LAB — HOMOCYSTEINE: Homocysteine: 9.5 umol/L (ref 0.0–17.2)

## 2020-04-26 IMAGING — MR MR HEAD W/O CM
11 series · 40 of 48 positions shown · non-contrast
Comparison: Head CT [DATE] and earlier.

CLINICAL DATA: 63-year-old female with dizziness, ataxia, weakness,
left upper extremity numbness, abnormal speech, diplopia onset
within the last 3 days.

EXAM:
MRI HEAD WITHOUT CONTRAST
TECHNIQUE: Multiplanar, multiecho pulse sequences of the brain and surrounding
structures were obtained without intravenous contrast.

[Series 5: ax dwi_tracew · axial · 3.0mm · 0.60mm/px · z∈[-89,+66]mm · 4 of 48 slices shown]
[im 1/48]
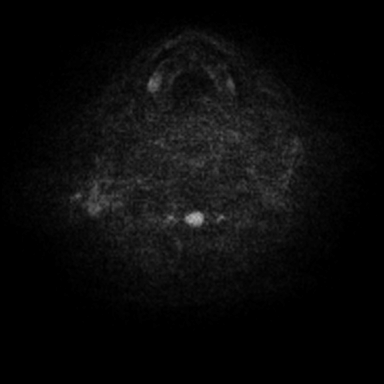
[im 16/48]
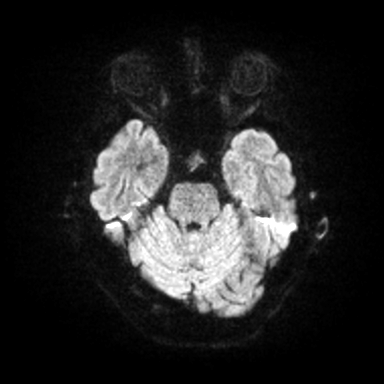
[im 32/48]
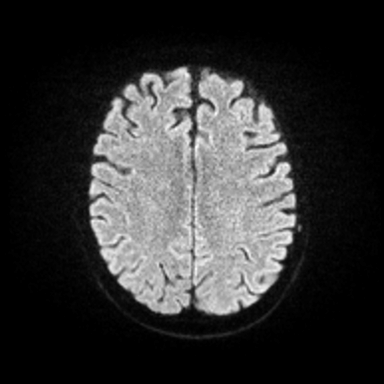
[im 48/48]
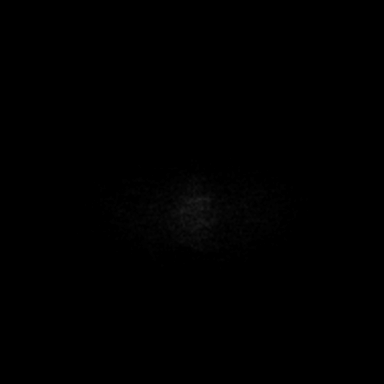

[Series 6: ax dwi_adc · axial · 3.0mm · 0.60mm/px · z∈[-89,+62]mm · 4 of 47 slices shown]
[im 1/47]
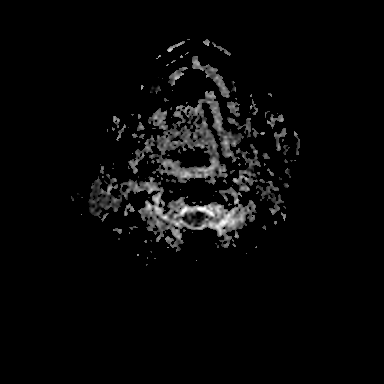
[im 16/47]
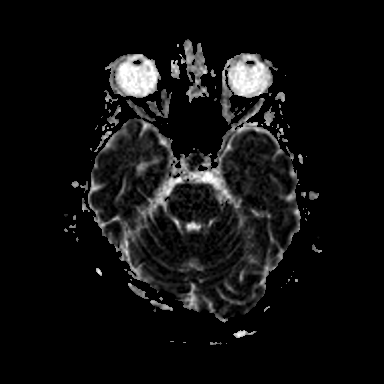
[im 31/47]
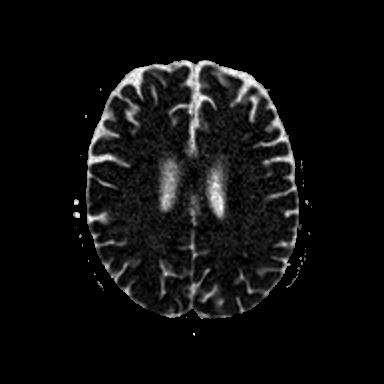
[im 47/47]
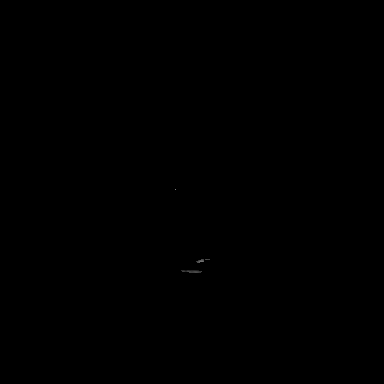

[Series 7: cor dwi_tracew · coronal · 5.0mm · 0.60mm/px · 3 of 38 slices shown]
[im 1/38]
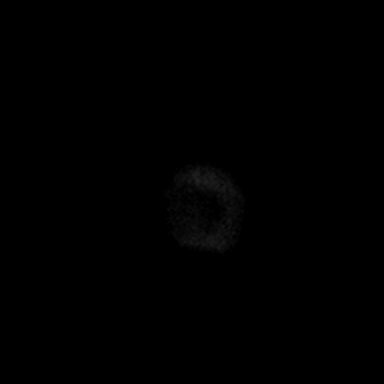
[im 19/38]
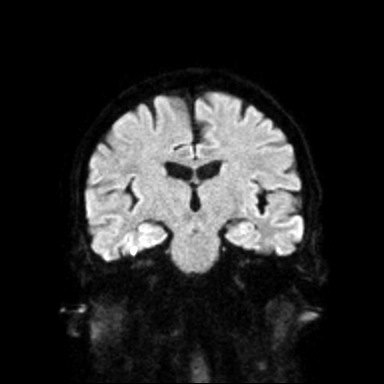
[im 38/38]
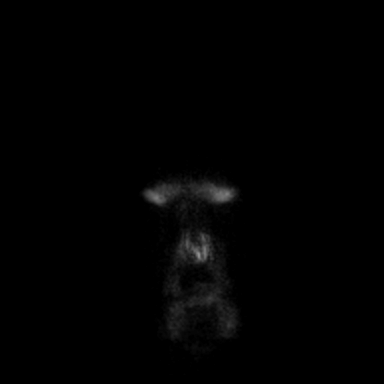

[Series 8: cor dwi_adc · coronal · 5.0mm · 0.60mm/px · 3 of 37 slices shown]
[im 1/37]
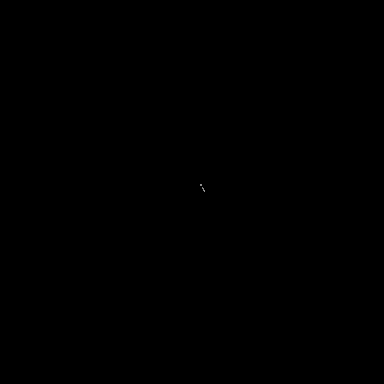
[im 19/37]
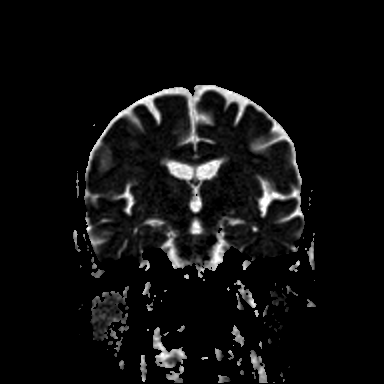
[im 37/37]
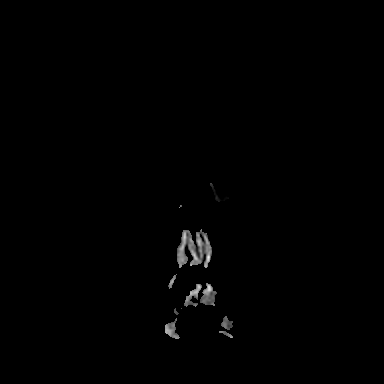

[Series 9: T1 · sagittal · 5.0mm · 0.62mm/px · 2 of 22 slices shown (1 of 2)]
[im 1/22]
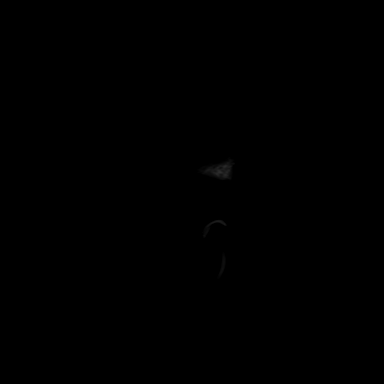
[im 22/22]
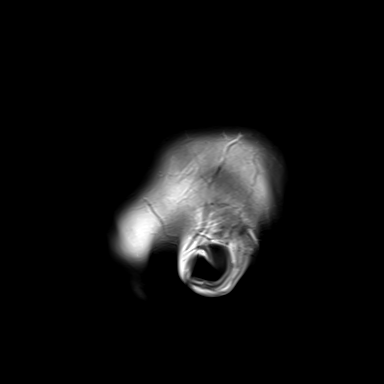

[Series 10: T2 · axial · 5.0mm · 0.53mm/px · z∈[-90,+66]mm · 2 of 27 slices shown (1 of 2)]
[im 1/27]
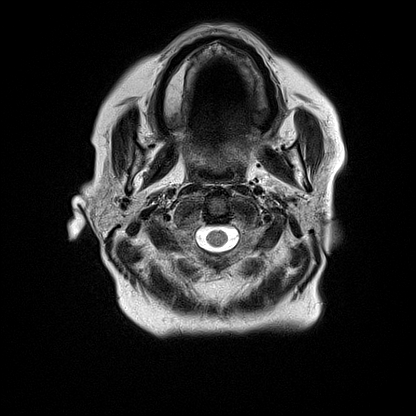
[im 27/27]
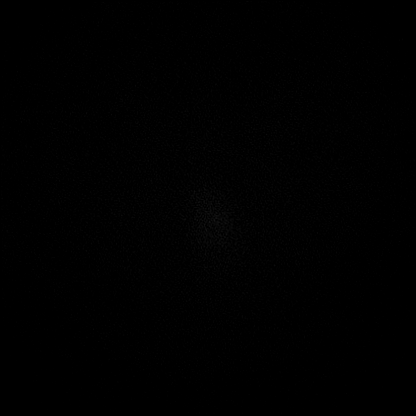

[Series 12: pha_images · axial · 3.0mm · 0.90mm/px · z∈[-96,+68]mm · 5 of 56 slices shown]
[im 1/56]
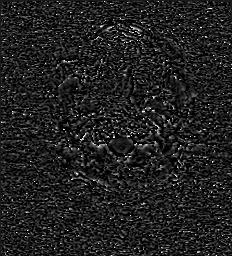
[im 14/56]
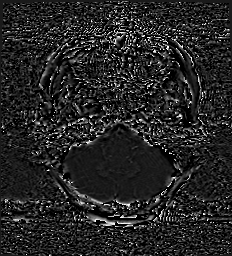
[im 28/56]
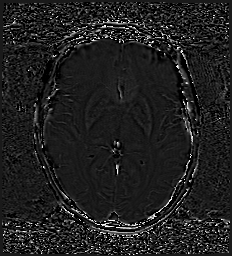
[im 42/56]
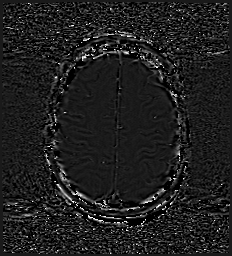
[im 56/56]
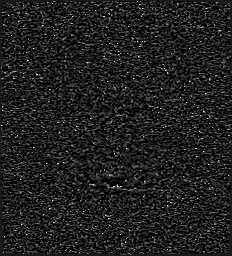

[Series 13: swi_images · axial · 3.0mm · 0.90mm/px · z∈[-99,-13]mm · 3 of 60 slices shown]
[im 1/60]
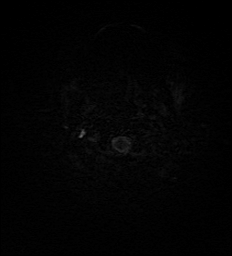
[im 15/60]
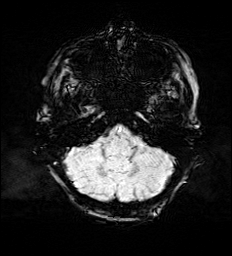
[im 30/60]
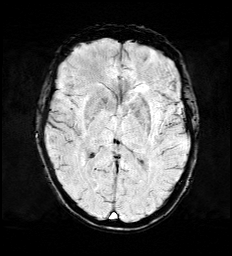

[Series 15: FLAIR · axial · 3.0mm · 0.53mm/px · z∈[-92,+69]mm · 4 of 55 slices shown]
[im 1/55]
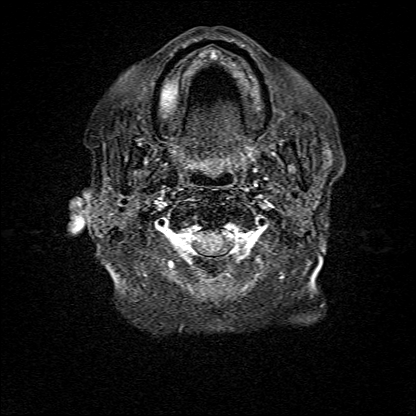
[im 19/55]
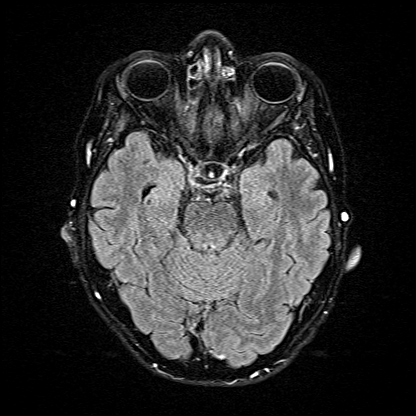
[im 37/55]
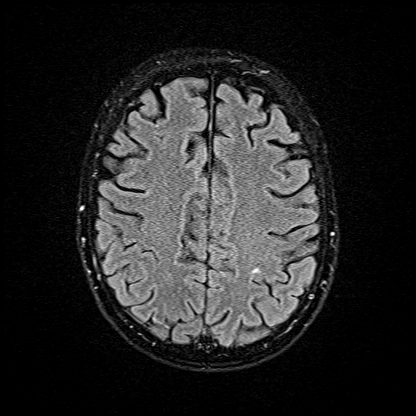
[im 55/55]
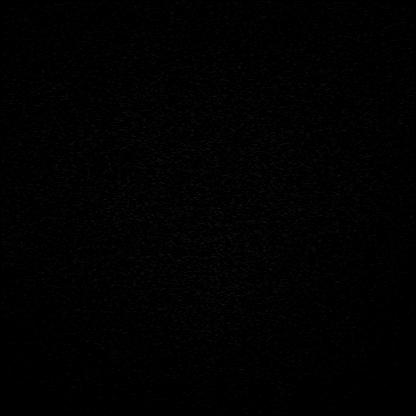

[Series 16: T1 · axial · 1.0mm · 0.98mm/px · z∈[-97,+77]mm · 8 of 176 slices shown (2 of 2)]
[im 1/176]
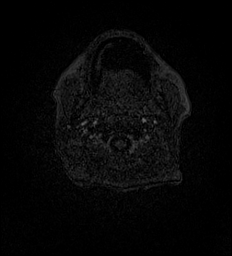
[im 27/176]
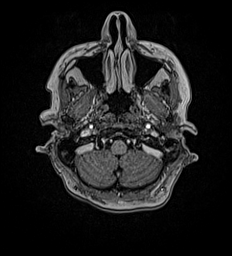
[im 54/176]
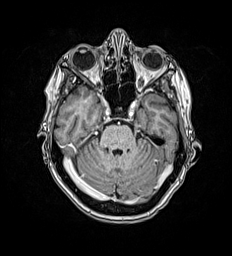
[im 81/176]
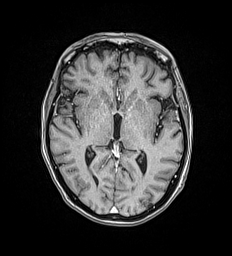
[im 95/176]
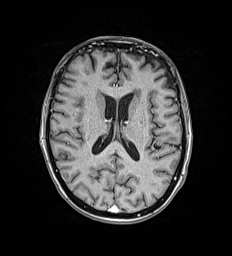
[im 122/176]
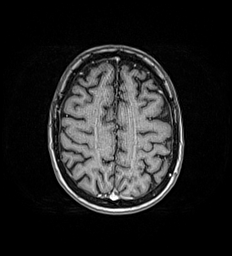
[im 149/176]
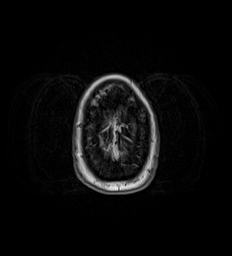
[im 176/176]
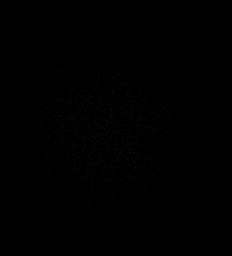

[Series 17: T2 · coronal · 5.0mm · 0.57mm/px · 2 of 29 slices shown (2 of 2)]
[im 1/29]
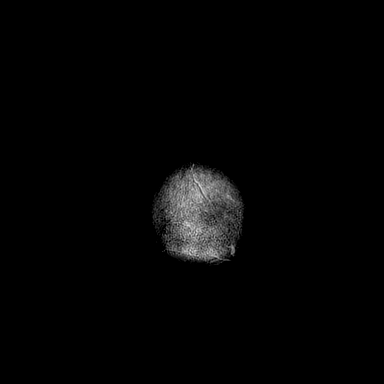
[im 29/29]
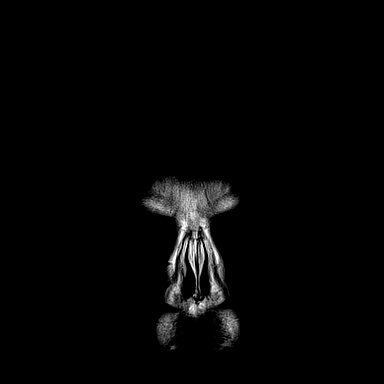

[40 of 48 positions shown; findings below may reference images not displayed]

FINDINGS: Brain: No restricted diffusion to suggest acute infarction. No
midline shift, mass effect, evidence of mass lesion,
ventriculomegaly, extra-axial collection or acute intracranial
hemorrhage. Cervicomedullary junction and pituitary are within
normal limits.

Scattered asymmetric T2 and FLAIR hyperintensity in the cerebral
white matter, with a small area of cortical or subcortical
encephalomalacia in the left parietal lobe (series 15, image 39).
The deep white matter capsules are involved bilaterally. The extent
is moderate for age. No chronic cerebral blood products or other
cortical encephalomalacia identified. Minimal T2 heterogeneity in
the deep gray nuclei. The brainstem and cerebellum appear within
normal limits.

Vascular: Major intracranial vascular flow voids are preserved.
Intrinsic T1 hyperintensity within the major intracranial vascular
structures. If the patient did not undergo IV contrast
administration prior to these images the intravenous iron could
explain this appearance.

Skull and upper cervical spine: Negative visible cervical spine for
age. Visualized bone marrow signal is within normal limits.

Sinuses/Orbits: Normal suprasellar cistern. Unremarkable noncontrast
cavernous sinus. Orbits appear symmetric and within normal limits.
Paranasal sinuses remain well pneumatized.

Other: Trace bilateral mastoid air cell fluid. Negative visible
nasopharynx aside from a probable tiny retention cyst in the midline
(series 10, image 5). Grossly normal visible internal auditory
structures. Visible scalp and face appear negative.
IMPRESSION: 1.  No acute intracranial abnormality.
2. Mild to moderate for age signal changes in the brain most
compatible with chronic small vessel disease.
3. Intrinsic T1 signal in the major vascular structures. If this
patient did not in a diverting TIGER receive gadolinium contrast prior
to this exam then recent administration of intravenous iron could
also have this appearance.

## 2020-04-26 IMAGING — MR MR MRA NECK W/O CM
1 of 2 series · 1 of 48 positions shown · non-contrast
Comparison: Brain MRI today reported separately.

CLINICAL DATA: 63-year-old female with dizziness, ataxia, weakness,
left upper extremity numbness, abnormal speech, diplopia onset
within the last 3 days.

EXAM:
MRA NECK WITHOUT CONTRAST
TECHNIQUE: Multiplanar, multiecho pulse sequences of the brain and surrounding
structures were obtained without intravenous contrast. Angiographic
images of the Circle of Willis were obtained using MRA technique
without intravenous contrast. Angiographic images of the neck were
obtained using MRA technique without intravenous contrast. Carotid
stenosis measurements (when applicable) are obtained utilizing
NASCET criteria, using the distal internal carotid diameter as the
denominator.

[Series 1048: neck tumble · axial · 0.5mm · 0.47mm/px · 1 of 2 slices shown]
[im 1/2]
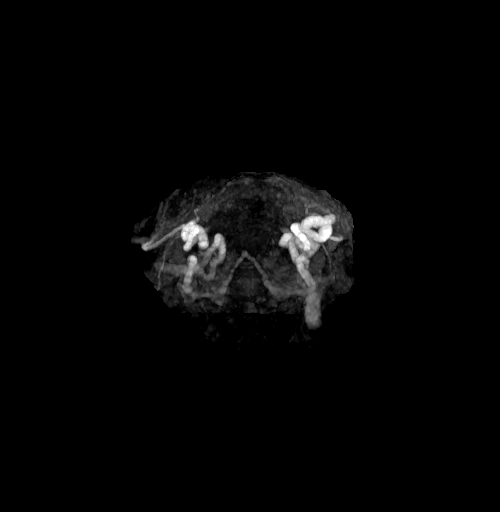

[1 of 48 positions shown; findings below may reference images not displayed]

FINDINGS: 3D time-of-flight neck MRA images reveal antegrade flow in both
cervical carotid and vertebral arteries to the skull base.

Both vertebral artery origins are included and appear normal.
Codominant vertebral arteries with no evidence of stenosis in the
neck.

Both carotid bifurcations appear within normal limits. No
hemodynamically significant cervical carotid stenosis identified.
IMPRESSION: Negative noncontrast neck MRA.

## 2020-04-26 IMAGING — MR MR MRA HEAD W/O CM
1 series · 19 of 48 positions shown · non-contrast
Comparison: Brain MRI and neck MRA today reported separately.

CLINICAL DATA: 63-year-old female with dizziness, ataxia, weakness,
left upper extremity numbness, abnormal speech, diplopia onset
within the last 3 days.

EXAM:
MRA HEAD WITHOUT CONTRAST
TECHNIQUE: Angiographic images of the Circle of Willis were obtained using MRA
technique without intravenous contrast.

[Series 1: TOF · axial · 0.5mm · 0.41mm/px · z∈[-91,+15]mm · 19 of 224 slices shown]
[im 1/224]
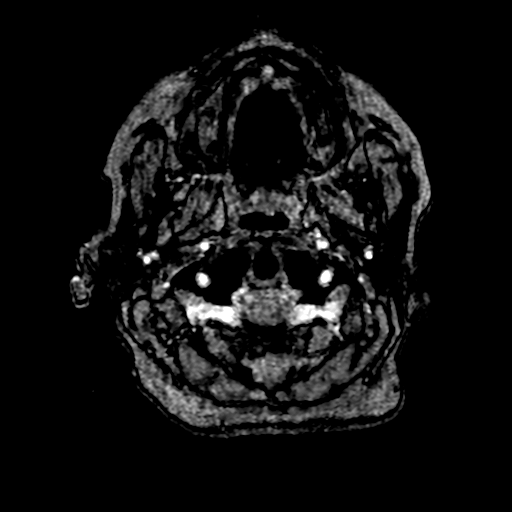
[im 5/224]
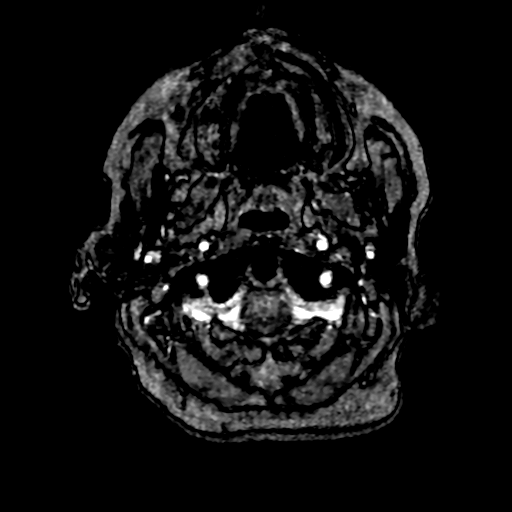
[im 10/224]
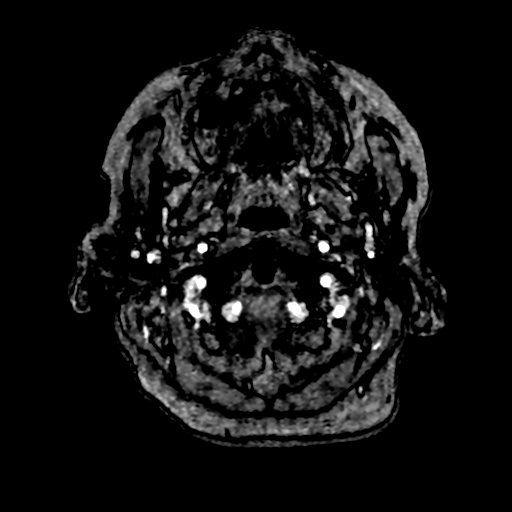
[im 15/224]
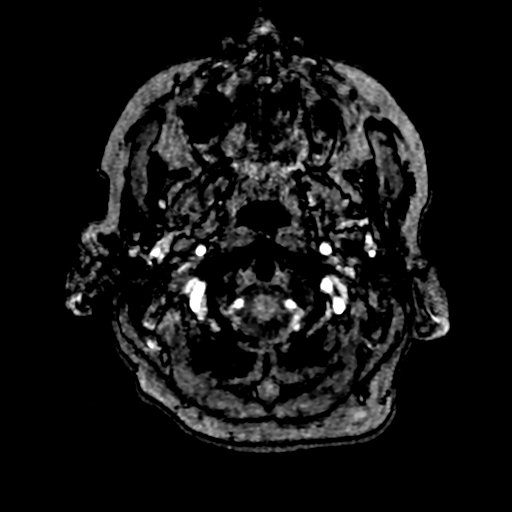
[im 19/224]
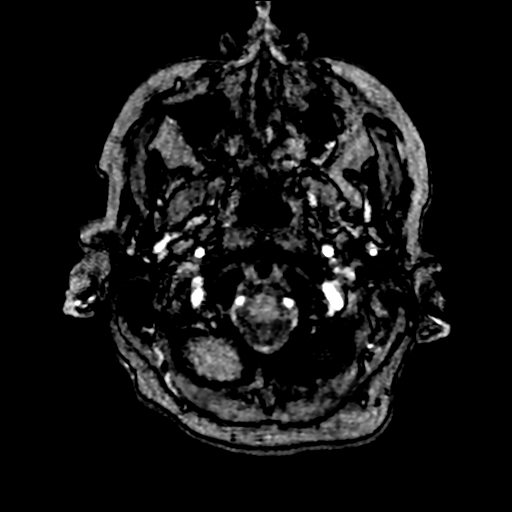
[im 24/224]
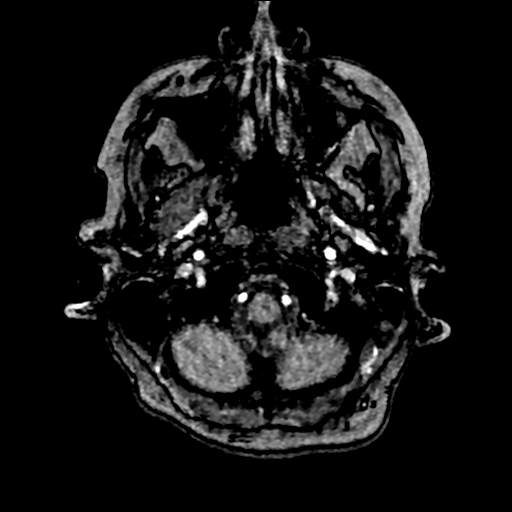
[im 29/224]
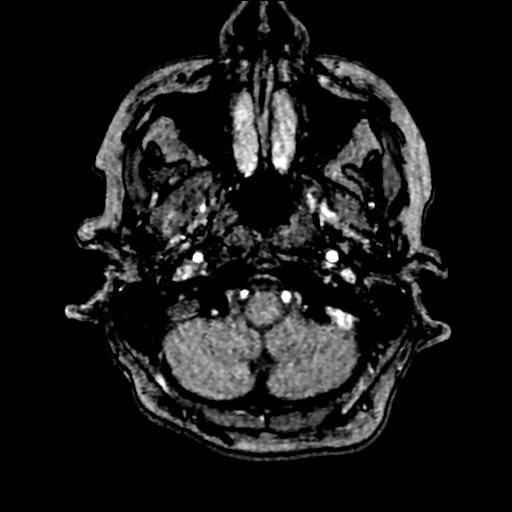
[im 34/224]
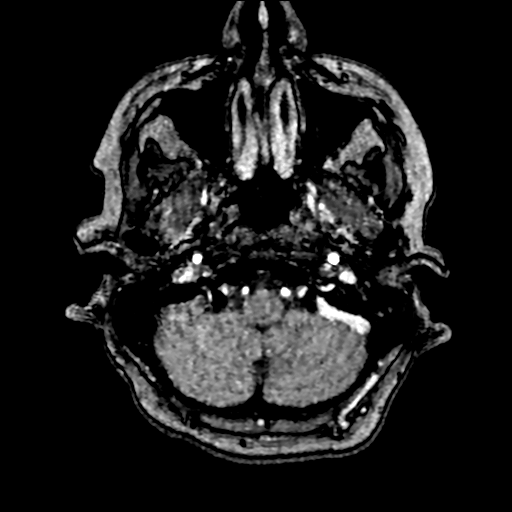
[im 38/224]
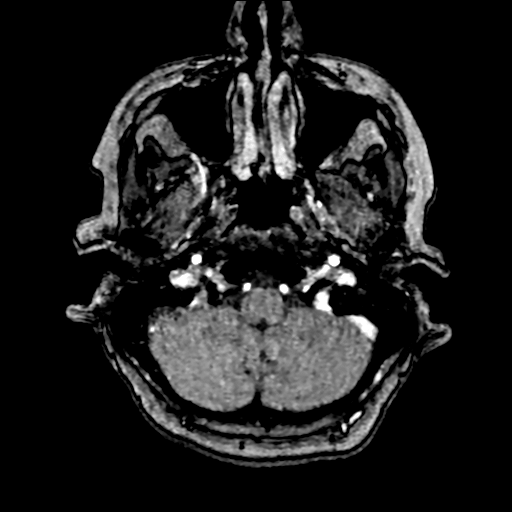
[im 43/224]
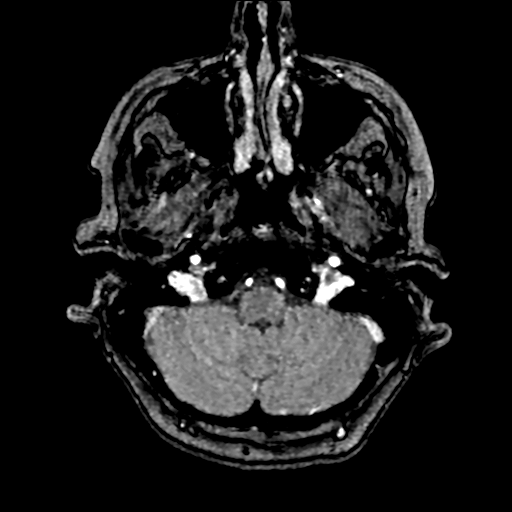
[im 48/224]
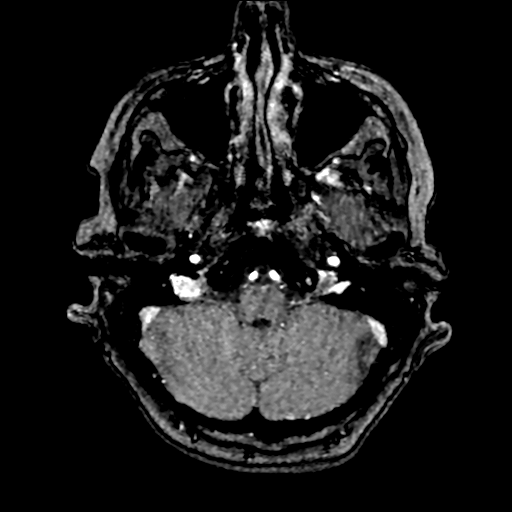
[im 72/224]
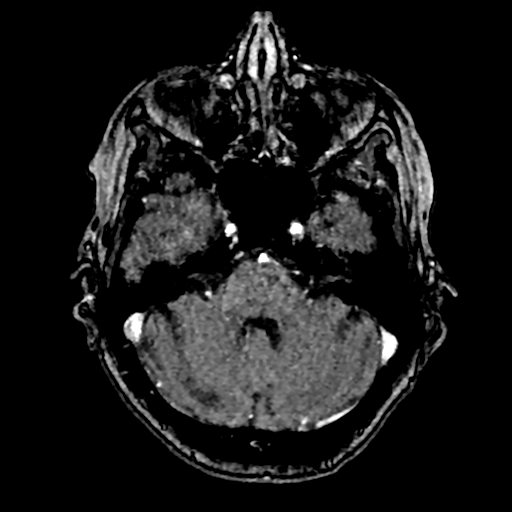
[im 100/224]
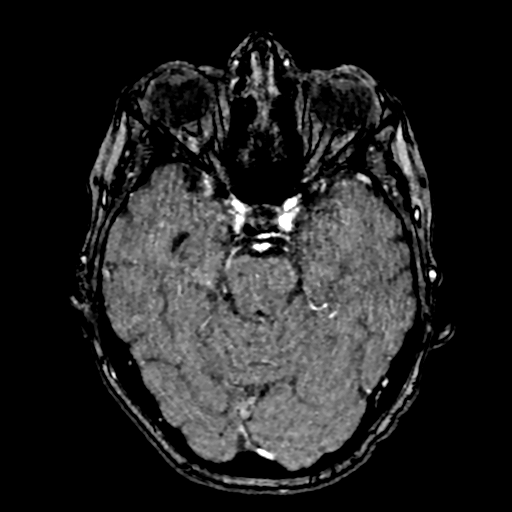
[im 114/224]
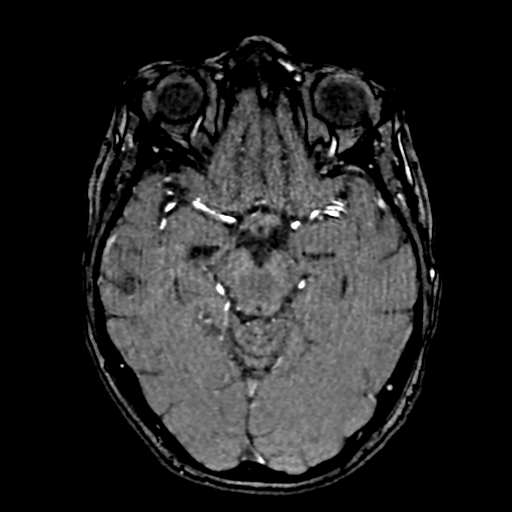
[im 129/224]
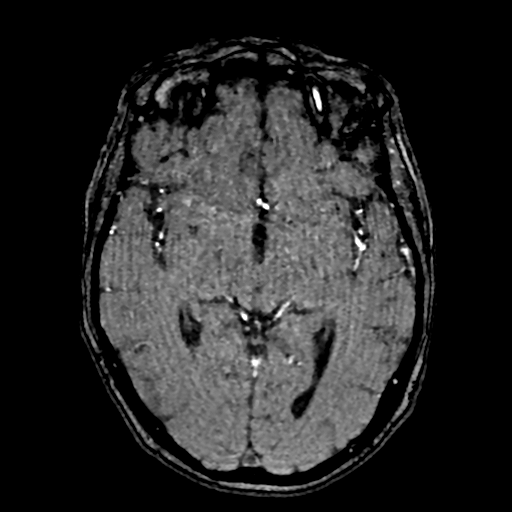
[im 157/224]
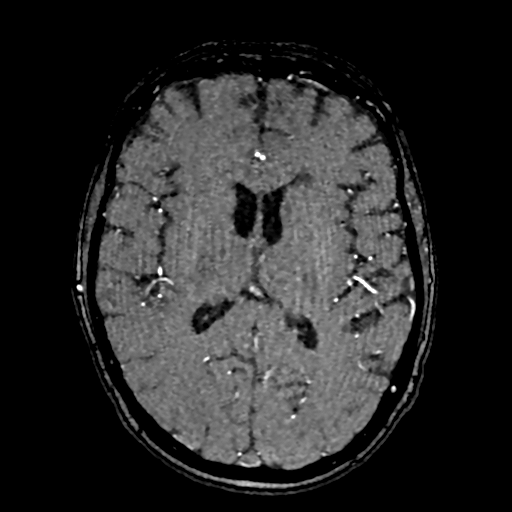
[im 186/224]
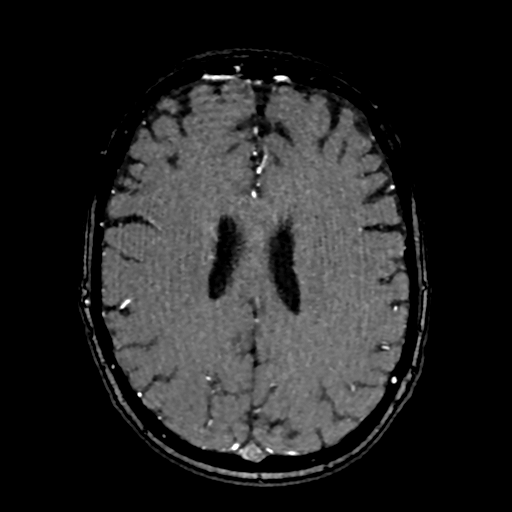
[im 190/224]
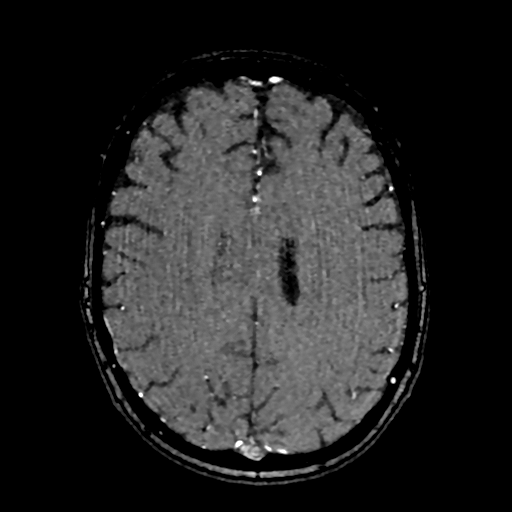
[im 214/224]
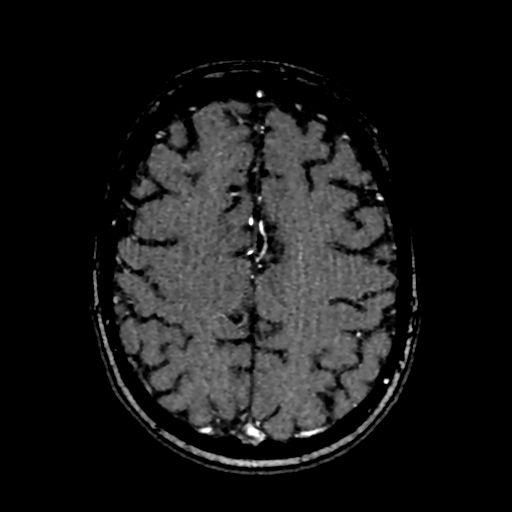

[19 of 48 positions shown; findings below may reference images not displayed]

FINDINGS: Intrinsic T1 hyperintensity as noted on the MRI this morning, with
associated increased signal seen in the venous vasculature here,
although not significantly affecting the quality of this
intracranial MRA.

Antegrade flow in the posterior circulation with codominant distal
vertebral arteries. No distal vertebral stenosis. Patent PICA
origins, vertebrobasilar junction, AICA origins, basilar artery, SCA
and PCA origins. Posterior communicating arteries are diminutive or
absent. Bilateral PCA branches are within normal limits.

Antegrade flow in both ICA siphons. No siphon stenosis. Patent
carotid termini. Dominant left and diminutive right ACA A1 segments.
Patent MCA and ACA origins. Anterior communicating artery and
visible bilateral ACA branches are within normal limits. The left
MCA M1 bifurcates early without stenosis. Right MCA M1 and right MCA
bifurcation are patent without stenosis. Visible bilateral MCA
branches are within normal limits.
IMPRESSION: 1.  Negative intracranial MRA.
2. Intrinsic T1 hyperintensity as described on the MRI this morning,
but not significantly affecting the quality of this MRA.

## 2020-04-26 MED ORDER — ASPIRIN EC 81 MG PO TBEC: 81.0000 mg | DELAYED_RELEASE_TABLET | Freq: Every day | ORAL | 0 refills | Status: DC

## 2020-04-26 NOTE — Discharge Summary (Signed)
Physician Discharge Summary  Patient ID: Amy Hansen MRN: [id number] DOB/AGE: [birth year] 63 y.o.  Admit date: 04/25/2020 Discharge date: 04/26/2020  Admission Diagnoses:  Discharge Diagnoses:  Principal Problem:   Weakness Active Problems:   Recurrent UTI   Restless legs syndrome (RLS)   Iron deficiency anemia   OCD (obsessive compulsive disorder)   Dysarthria   Diplopia   Pancolitis (HCC)   Type 2 diabetes mellitus (HCC) TIA.   Discharged Condition: good  Hospital Course:  Amy Hansen is a 64 y.o. female with medical history significant for chronic UTI on chronic prophylactic Keflex, hypertension, migraine, insulin-dependent type 2 diabetes,chronic diastolic heart failure, GERD, restless leg syndrome, OCD who presents with worsening weakness and diplopia. Patient was recently admitted to the hospital with UTI secondary to Enterobacter.  She was just discharged on 9/26.  Since discharge, her diarrhea essentially resolved.  She does not have any fever chills, no urinary symptoms.  She has yet to finish her antibiotics. Stroke work-up in the hospital including MRI of the brain, MRA of the brain and the neck.  Did not show significant cervical and intracranial vascular abnormality.  MRI of the brain did not show stroke.  Patient has been seen by teleneurology, her condition may be more consistent with TIA.  She did not have hypoglycemia at time of admission.  We will start aspirin per recommendation from teleneurology.  Her lipid panel showed LDL of 16, HDL 29.  She is advised to follow-up with her family doctor in 1 week and neurology in 2 weeks.   Consults: neurology  Significant Diagnostic Studies:  MRI HEAD WITHOUT CONTRAST  TECHNIQUE: Multiplanar, multiecho pulse sequences of the brain and surrounding structures were obtained without intravenous contrast.  COMPARISON:  Head CT 04/25/2020 and earlier.  FINDINGS: Brain: No restricted diffusion to suggest acute  infarction. No midline shift, mass effect, evidence of mass lesion, ventriculomegaly, extra-axial collection or acute intracranial hemorrhage. Cervicomedullary junction and pituitary are within normal limits.  Scattered asymmetric T2 and FLAIR hyperintensity in the cerebral white matter, with a small area of cortical or subcortical encephalomalacia in the left parietal lobe (series 15, image 39). The deep white matter capsules are involved bilaterally. The extent is moderate for age. No chronic cerebral blood products or other cortical encephalomalacia identified. Minimal T2 heterogeneity in the deep gray nuclei. The brainstem and cerebellum appear within normal limits.  Vascular: Major intracranial vascular flow voids are preserved. Intrinsic T1 hyperintensity within the major intracranial vascular structures. If the patient did not undergo IV contrast administration prior to these images the intravenous iron could explain this appearance.  Skull and upper cervical spine: Negative visible cervical spine for age. Visualized bone marrow signal is within normal limits.  Sinuses/Orbits: Normal suprasellar cistern. Unremarkable noncontrast cavernous sinus. Orbits appear symmetric and within normal limits. Paranasal sinuses remain well pneumatized.  Other: Trace bilateral mastoid air cell fluid. Negative visible nasopharynx aside from a probable tiny retention cyst in the midline (series 10, image 5). Grossly normal visible internal auditory structures. Visible scalp and face appear negative.  IMPRESSION: 1.  No acute intracranial abnormality. 2. Mild to moderate for age signal changes in the brain most compatible with chronic small vessel disease. 3. Intrinsic T1 signal in the major vascular structures. If this patient did not in a diverting [place] receive gadolinium contrast prior to this exam then recent administration of intravenous iron could also have this  appearance.  MRA NECK WITHOUT CONTRAST  TECHNIQUE: Multiplanar, multiecho pulse sequences  of the brain and surrounding structures were obtained without intravenous contrast. Angiographic images of the Circle of Willis were obtained using MRA technique without intravenous contrast. Angiographic images of the neck were obtained using MRA technique without intravenous contrast. Carotid stenosis measurements (when applicable) are obtained utilizing NASCET criteria, using the distal internal carotid diameter as the denominator.  COMPARISON:  Brain MRI today reported separately.  FINDINGS: 3D time-of-flight neck MRA images reveal antegrade flow in both cervical carotid and vertebral arteries to the skull base.  Both vertebral artery origins are included and appear normal. Codominant vertebral arteries with no evidence of stenosis in the neck.  Both carotid bifurcations appear within normal limits. No hemodynamically significant cervical carotid stenosis identified.  IMPRESSION: Negative noncontrast neck MRA.   Electronically Signed   By: Genevie Ann M.D.   On: 04/26/2020 06:26  MRA HEAD WITHOUT CONTRAST  TECHNIQUE: Angiographic images of the Circle of Willis were obtained using MRA technique without intravenous contrast.  COMPARISON:  Brain MRI and neck MRA today reported separately.  FINDINGS: Intrinsic T1 hyperintensity as noted on the MRI this morning, with associated increased signal seen in the venous vasculature here, although not significantly affecting the quality of this intracranial MRA.  Antegrade flow in the posterior circulation with codominant distal vertebral arteries. No distal vertebral stenosis. Patent PICA origins, vertebrobasilar junction, AICA origins, basilar artery, SCA and PCA origins. Posterior communicating arteries are diminutive or absent. Bilateral PCA branches are within normal limits.  Antegrade flow in both ICA siphons. No  siphon stenosis. Patent carotid termini. Dominant left and diminutive right ACA A1 segments. Patent MCA and ACA origins. Anterior communicating artery and visible bilateral ACA branches are within normal limits. The left MCA M1 bifurcates early without stenosis. Right MCA M1 and right MCA bifurcation are patent without stenosis. Visible bilateral MCA branches are within normal limits.  IMPRESSION: 1.  Negative intracranial MRA. 2. Intrinsic T1 hyperintensity as described on the MRI this morning, but not significantly affecting the quality of this MRA.   Electronically Signed   By: Genevie Ann M.D.   On: 04/26/2020 06:29    Treatments: Stroke work-up.  Discharge Exam: Blood pressure 125/81, pulse 90, temperature 98.7 F (37.1 C), temperature source Oral, resp. rate 19, height 5' 2"  (1.575 m), weight 56.7 kg, SpO2 97 %. General appearance: alert, cooperative and Oriented to time place and person. Resp: clear to auscultation bilaterally Cardio: regular rate and rhythm, S1, S2 normal, no murmur, click, rub or gallop GI: soft, non-tender; bowel sounds normal; no masses,  no organomegaly Extremities: extremities normal, atraumatic, no cyanosis or edema  Disposition: Discharge disposition: 01-Home or Self Care       Discharge Instructions    Diet - low sodium heart healthy   Complete by: As directed    Increase activity slowly   Complete by: As directed      Allergies as of 04/26/2020      Reactions   Avelox [moxifloxacin Hcl In Nacl] Anaphylaxis   Bactrim [sulfamethoxazole-trimethoprim] Anaphylaxis   Ciprofloxacin Other (See Comments)   Pt states she was told never to take this as it is in the same family as Avelox.    Depakote [divalproex Sodium]    Imitrex [sumatriptan] Other (See Comments)   Neck and shoulder pain   Stadol [butorphanol] Rash      Medication List    TAKE these medications   ALPRAZolam 0.5 MG tablet Commonly known as: XANAX Take 1 tablet (0.5 mg  total) by  mouth 4 (four) times daily as needed for anxiety.   amoxicillin-clavulanate 875-125 MG tablet Commonly known as: AUGMENTIN Take 1 tablet by mouth every 12 (twelve) hours for 5 days.   aspirin EC 81 MG tablet Take 1 tablet (81 mg total) by mouth daily. Swallow whole.   buPROPion 150 MG 24 hr tablet Commonly known as: WELLBUTRIN XL Take 3 tablets (450 mg total) by mouth daily.   estradiol 0.1 MG/GM vaginal cream Commonly known as: ESTRACE VAGINAL Apply 0.12m (pea-sized amount)  just inside the vaginal introitus with a finger-tip on Monday, Wednesday and Friday nights.   ferrous sulfate 325 (65 FE) MG EC tablet Take 1 tablet (325 mg total) by mouth 2 (two) times daily.   fosfomycin 3 g Pack Commonly known as: MONUROL Take 3 g by mouth once for 1 dose. Start taking on: April 27, 2020   furosemide 20 MG tablet Commonly known as: LASIX Take 1 tablet (20 mg total) by mouth daily.   glipiZIDE 5 MG tablet Commonly known as: GLUCOTROL TAKE 1 TABLET BY MOUTH TWICE DAILY WITH MEALS (APPOINTMENT  REQUIRED  FOR  FUTURE  REFILLS) What changed:   how much to take  how to take this  when to take this   lamoTRIgine 200 MG tablet Commonly known as: LAMICTAL TAKE 2 TABLETS BY MOUTH AT BEDTIME   loperamide 2 MG capsule Commonly known as: IMODIUM Take 1 capsule (2 mg total) by mouth as needed for diarrhea or loose stools.   metFORMIN 1000 MG tablet Commonly known as: GLUCOPHAGE TAKE 1 TABLET BY MOUTH TWICE DAILY WITH MEALS . APPOINTMENT REQUIRED FOR FUTURE REFILLS What changed:   how much to take  how to take this  when to take this   multivitamin with minerals Tabs tablet Take 1 tablet by mouth daily.   Pen Needles 32G X 4 MM Misc 1 Units by Does not apply route at bedtime.   pramipexole 1 MG tablet Commonly known as: MIRAPEX Take 1 tablet (1 mg total) by mouth at bedtime.   promethazine 25 MG tablet Commonly known as: PHENERGAN Take 1 tablet (25 mg  total) by mouth every 8 (eight) hours as needed for nausea or vomiting.   risperiDONE 3 MG tablet Commonly known as: RISPERDAL TAKE 1 TABLET BY MOUTH EVERY DAY AT BEDTIME   sertraline 100 MG tablet Commonly known as: ZOLOFT Take 1.5 tablets (150 mg total) by mouth daily.   Toujeo Max SoloStar 300 UNIT/ML Solostar Pen Generic drug: insulin glargine (2 Unit Dial) Inject 24 units daily. Titrate as instructed. Max daily dose 40 units       Follow-up Information    LVolney American PA-C Follow up in 1 week(s).   Specialty: Family Medicine Contact information: [address] [phone]        [facility] Follow up in 2 week(s).   Contact information: [phone]             Signed: DSharen Hones9/28/2021, 10:29 AM

## 2020-04-26 NOTE — Evaluation (Cosign Needed Addendum)
Speech Language Pathology Evaluation Patient Details Name: Amy Hansen MRN: [id number] DOB: 12/23/1955 Today's Date: 04/26/2020 Time: 1000-1040 SLP Time Calculation (min) (ACUTE ONLY): 40 min  Problem List:  Patient Active Problem List   Diagnosis Date Noted  . Dysarthria 04/25/2020  . Weakness 04/25/2020  . Diplopia 04/25/2020  . Pancolitis ([place]) 04/25/2020  . Type 2 diabetes mellitus ([place]) 04/25/2020  . Diarrhea 04/21/2020  . Left lower quadrant abdominal pain 04/21/2020  . Colitis 04/21/2020  . Severe sepsis ([place]) 04/21/2020  . AKI (acute kidney injury) ([place]) 04/21/2020  . Hypokalemia 04/21/2020  . Hyponatremia 04/21/2020  . Anemia 04/21/2020  . Major depression, chronic 06/01/2018  . OCD (obsessive compulsive disorder) 06/01/2018  . Chronic heart failure with preserved ejection fraction ([place]) 05/16/2018  . Left leg pain 11/04/2017  . Atypical chest pain 03/05/2017  . GERD (gastroesophageal reflux disease) 02/18/2017  . Iron deficiency anemia 10/18/2016  . Foot swelling 07/17/2016  . Hypertension 03/21/2016  . Vitamin D deficiency 02/21/2016  . B12 deficiency 02/21/2016  . Incontinent of feces 02/21/2016  . Poorly controlled type 2 diabetes mellitus ([place]) 09/13/2015  . Dysuria 06/14/2015  . Migraines 05/23/2015  . Restless legs syndrome (RLS) 05/23/2015  . Recurrent UTI 04/26/2015  . Atrophic vaginitis 04/26/2015  . Dyspareunia, female 04/26/2015   Past Medical History:  Past Medical History:  Diagnosis Date  . Anemia   . Anxiety   . Chronic kidney disease    UTI, hematuria in urine  . Depression   . Diabetes ([place])   . Diverticulosis   . Frequent headaches   . Interstitial cystitis   . Recurrent UTI   . Restless leg syndrome   . Urinary frequency    Past Surgical History:  Past Surgical History:  Procedure Laterality Date  . ABDOMINAL HYSTERECTOMY    . bariatric bypass  2012  . CARPAL TUNNEL RELEASE Right 2003  . CARPAL TUNNEL RELEASE Right     2008  . CHOLECYSTECTOMY  1975  . CYSTOSCOPY W/ RETROGRADES Bilateral 06/06/2015   Procedure: CYSTOSCOPY WITH RETROGRADE PYELOGRAM;  Surgeon: Festus Aloe, MD;  Location: [facility] ORS;  Service: Urology;  Laterality: Bilateral;  . FL INJ LEFT KNEE CT ARTHROGRAM ([facility] HX) Left    1995  . GASTRIC BYPASS  2010  . [place]  2013  . KNEE ARTHROSCOPY Left 1996  . TONSILLECTOMY     HPI:  Pt is a 64 y.o. female with a past medical history of diabetes, anxiety, and CKD who presents for multiple symptoms including ataxia, generalized weakness, left upper extremity numbness, difficulty word finding, and diplopia that occurred within the last 3 days. Patient states that her generalized weakness and and ambulatory dysfunction began approximately 48 hours ago. MRI (04/26/20): 1.No acute intracranial abnormality. 2. Mild to moderate for age signal changes in the brain most compatible with chronic small vessel disease. 3. Intrinsic T1 signal in the major vascular structures.  Assessment / Plan / Recommendation Clinical Impression  Pt was seen for informal cognitive-linguistic screening at bedside. Pt was A&O x4. Pt reports word finding difficulty which began on 9/27, concomittant with the other symptoms leading to her ED admittance. Per MRI (9/28): No acute intracranial abnormality; Mild to moderate for age signal changes in the brain most compatible with chronic small vessel disease; Intrinsic T1 signal in the major vascular structures. Upon arrival, pt appeared mildly agitated & fatigued, requiring increased listening and reassurance from the clinician. Given gentle encouragement, the pt's affect calmed and participation increased--  her communication appeared less deliberate as well.  Environmental distractions were reduced to allow the pt to remain on task. Throughout eval, pt's speech appeared to be deliberate, requiring min extra time to organize thoughts. Pt reported difficulty formulating a text to  her daughter this AM. Of note, she also reports difficulty swallowing and a globus sensation on the L side of the neck with mod associated pain-- especially prevalent when swallowing pills but present for all swallows. Pt recalls having swallowing difficulty for ~2 months & believes her overall oral intake is decreased d/t these symptoms. Pt was observed swallowing pills whole in thin liquids & no overt clinical S/S of aspiration were noted.  Pt's receptive and expressive verbal abilities appear to be functional. Both domains are impacted by pt's observed tendency to quickly lose train of thought & focus. During picture description task, pt used agrammatic speech-- using single words to describe objects seen in the picture. When prompted to use a full sentence to describe items in the picture, pt was stimulable & produced 4 complete sentences, appearing min effortful. During picture description pt also demonstrated a single semantic paraphasia-- replacing "sand castle" with "sand person". Deficits in receptive & expressive domains were improved when the clinician engaged the following communication strategies: repetition for follow through with tasks, simplification of linguistic output to increase understanding & follow through, and intermittent redirection to the task. Unsure if current setting & pt health status are having an impact on pt's overall cognitive-linguisitic presentation.   Based on the presentation of this pt. Follow up could be warranted-- pt may benefit from f/u at discharge for a formal assessment of speech, language, and cognitive abilities. Findings and suggestions were discussed with husband and pt: both agreed.     SLP Assessment  SLP Recommendation/Assessment: All further Speech Lanaguage Pathology  needs can be addressed in the next venue of care SLP Visit Diagnosis: Attention and concentration deficit;Cognitive communication deficit (R41.841) Attention and concentration deficit  following: Other cerebrovascular disease    Follow Up Recommendations  Home health SLP;Outpatient SLP    Frequency and Duration           SLP Evaluation Cognition  Overall Cognitive Status: Within Functional Limits for tasks assessed Arousal/Alertness: Awake/alert Orientation Level: Oriented X4 Memory: Appears intact Awareness: Appears intact Problem Solving: Appears intact Behaviors: Verbal agitation;Poor frustration tolerance;Lability       Comprehension  Auditory Comprehension Overall Auditory Comprehension: Appears within functional limits for tasks assessed Yes/No Questions: Within Functional Limits Commands: Within Functional Limits Conversation: Simple Other Conversation Comments: Some rephrasing required to reduce confusion EffectiveTechniques: Extra processing time;Pausing;Repetition;Slowed speech    Expression Expression Primary Mode of Expression: Verbal Verbal Expression Overall Verbal Expression: Appears within functional limits for tasks assessed Initiation: No impairment Automatic Speech: Name;Social Response Level of Generative/Spontaneous Verbalization: Word;Phrase;Sentence;Conversation Naming: No impairment Pragmatics: No impairment Interfering Components: Attention Effective Techniques: Open ended questions Written Expression Dominant Hand: Left Written Expression: Exceptions to [facility]   Oral / Motor  Motor Speech Overall Motor Speech: Appears within functional limits for tasks assessed Respiration: Within functional limits Phonation: Normal Resonance: Within functional limits Articulation: Within functional limitis Intelligibility: Intelligible   GO                    Quintella Baton  Graduate Clinician 04/26/2020, 12:03 PM

## 2020-04-26 NOTE — ED Notes (Addendum)
Admitting mD at bedisde

## 2020-04-26 NOTE — Consult Note (Signed)
[facility] Tele[facility]urology Consult Services  Stat Consult  Date of Service:   04/25/2020 23:49:12  Impression:     .  R42 - Dizziness/ Vertigo/ Giddiness     .  R53.1 - Weakness  Comments/Sign-Out: Inpatient stroke workup - telemetry, neurochecks, add asa, check lipids, MRI Head, MRA Head/neck, TTE, physical/occupational/speech therapy if appropriate  CT HEAD: Showed No Acute Hemorrhage or Acute Core Infarct  Metrics: [facility] Notification Time: 04/25/2020 23:48:00 Stamp Time: 04/25/2020 23:49:12 Callback Response Time: 04/25/2020 23:50:21  Our recommendations are outlined below.  Imaging Studies:     .  MRI Head Without Contrast     .  MRA Head and Neck Without Contrast When Available - Stroke Protocol     .  Echocardiogram - Transthoracic Echocardiogram  Therapies:     .  Physical Therapy, Occupational Therapy, Speech Therapy Assessment When Applicable  Other WorkUp:     .  Infectious/metabolic workup per primary team  Disposition: Neurology Follow Up Recommended  Sign Out:     .  Discussed with Emergency Department Provider  ----------------------------------------------------------------------------------------------------  Chief Complaint: weakness, dizziness  History of Present Illness: Patient is a 64 year old Female.  64 yo F h/o HTN, DM, migraine, GERD, CHF presents with dizziness, weakness, feeling funny with onset 3 days ago. She c/o dizziness now. She has had difficulty walking since yesterday, she fell yesterday and had to use a walker.   Past Medical History:     . Hypertension     . Diabetes Mellitus     . There is NO history of Stroke  Anticoagulant use:  No  Antiplatelet use: No    Examination: BP(131/92), Pulse(103), Blood Glucose(116)  Neuro Exam:  General: Alert,Awake  Speech: Fluent:  Language: Intact:  Face: Symmetric:  Facial Sensation:  Visual Fields:  Extraocular Movements: Intact:  Motor Exam: No  Drift:  Sensation:  Coordination: Intact:     Patient/Family was informed the Neurology Consult would occur via TeleHealth consult by way of interactive audio and video telecommunications and consented to receiving care in this manner.  Patient is being evaluated for possible acute neurologic impairment and high probability of imminent or life-threatening deterioration. I spent total of 30 minutes providing care to this patient, including time for face to face visit via telemedicine, review of medical records, imaging studies and discussion of findings with providers, the patient and/or family.   Dr Launa Grill   [facility] [phone]  Case [id number]

## 2020-04-26 NOTE — Evaluation (Signed)
Occupational Therapy Evaluation Patient Details Name: Amy Hansen MRN: [id number] DOB: 1956-02-11 Today's Date: 04/26/2020    History of Present Illness 64 y.o. female with medical history significant for chronic UTI on chronic prophylactic Keflex, hypertension, migraine, insulin-dependent type 2 diabetes, chronic diastolic heart failure, GERD, restless leg syndrome, OCD who presents with worsening weakness and diplopia.Patient was recently admitted to the hospital with UTI secondary to Enterobacter.  She was just discharged on 9/26.  Since discharge, her diarrhea essentially resolved.  She does not have any fever chills, no urinary symptoms.  She has yet to finish her antibiotics.Stroke work-up in the hospital including MRI of the brain, MRA of the brain and the neck.   Clinical Impression   Patient presenting with decreased I in self care, balance, functional mobility/transfers, endurance, and pt reports  Patient reports being independent PTA. She has been using a RW, shower seat, and BSC since fall yesterday. Patient currently functioning at min guard with functional tasks on RW. Pt is reporting diplopia throughout session. Patient will benefit from acute OT to increase overall independence in the areas of ADLs, functional mobility, and safety awareness in order to safely discharge home with caregiver.    Follow Up Recommendations  Home health OT;Supervision - Intermittent    Equipment Recommendations  None recommended by OT    Recommendations for Other Services Other (comment) (none at this time)     Precautions / Restrictions Precautions Precautions: Fall      Mobility Bed Mobility Overal bed mobility: Needs Assistance Bed Mobility: Rolling;Supine to Sit;Sit to Supine Rolling: Supervision   Supine to sit: Supervision Sit to supine: Supervision      Transfers Overall transfer level: Needs assistance   Transfers: Sit to/from Stand;Stand Pivot Transfers Sit to Stand: Min  guard Stand pivot transfers: Min guard       General transfer comment: mild increased effort to stand but overall steady and safe    Balance Overall balance assessment: Needs assistance Sitting-balance support: No upper extremity supported;Feet unsupported Sitting balance-Leahy Scale: Good Sitting balance - Comments: steady sitting reaching outside BOS   Standing balance support: No upper extremity supported;During functional activity Standing balance-Leahy Scale: Fair Standing balance comment: reliance on AD          ADL either performed or assessed with clinical judgement   ADL Overall ADL's : Needs assistance/impaired     Grooming: Wash/dry hands;Wash/dry face;Oral care;Standing;Supervision/safety               Lower Body Dressing: Min guard;Sitting/lateral leans Lower Body Dressing Details (indicate cue type and reason): B socks         Vision Patient Visual Report: Diplopia Vision Assessment?: Yes Eye Alignment: Within Functional Limits Ocular Range of Motion: Within Functional Limits Alignment/Gaze Preference: Within Defined Limits Tracking/Visual Pursuits: Decreased smoothness of eye movement to LEFT superior field;Decreased smoothness of eye movement to RIGHT superior field Saccades: Additional eye shifts occurred during testing Convergence: Impaired - to be further tested in functional context Diplopia Assessment: Objects split side to side;Objects split on top of one another;Only with left gaze            Pertinent Vitals/Pain Pain Assessment: No/denies pain     Hand Dominance Left   Extremity/Trunk Assessment Upper Extremity Assessment Upper Extremity Assessment: Generalized weakness   Lower Extremity Assessment Lower Extremity Assessment: Generalized weakness   Cervical / Trunk Assessment Cervical / Trunk Assessment: Normal   Communication Communication Communication: No difficulties   Cognition Arousal/Alertness:  Awake/alert  Behavior During Therapy: WFL for tasks assessed/performed Overall Cognitive Status: Within Functional Limits for tasks assessed                     Home Living Family/patient expects to be discharged to:: Private residence Living Arrangements: Spouse/significant other Available Help at Discharge: Family;Available PRN/intermittently Type of Home: House Home Access: Stairs to enter [employer] of Steps: 3 Entrance Stairs-Rails: None Home Layout: One level;Laundry or work area in [place] Shower/Tub: Teacher, early years/pre: [place]: Environmental consultant - 2 wheels;Bedside commode;Shower seat          Prior Functioning/Environment Level of Independence: Independent        Comments: She fell yesterday and has been using RW since        OT Problem List: Decreased strength;Decreased activity tolerance;Decreased safety awareness;Impaired balance (sitting and/or standing);Decreased knowledge of use of DME or AE;Impaired vision/perception;Cardiopulmonary status limiting activity      OT Treatment/Interventions: Self-care/ADL training;Therapeutic activities;Therapeutic exercise;Energy conservation;Patient/family education;Balance training;DME and/or AE instruction    OT Goals(Current goals can be found in the care plan section) Acute Rehab OT Goals Patient Stated Goal: to go home OT Goal Formulation: With patient Time For Goal Achievement: 05/10/20 Potential to Achieve Goals: Good ADL Goals Pt Will Perform Grooming: with modified independence;standing Pt Will Transfer to Toilet: with modified independence;ambulating;bedside commode Pt Will Perform Toileting - Clothing Manipulation and hygiene: with modified independence Pt Will Perform Tub/Shower Transfer: with supervision;shower seat  OT Frequency: Min 1X/week   Barriers to D/C: Other (comment)  none known at this time          AM-PAC OT "6 Clicks" Daily  Activity     Outcome Measure Help from another person eating meals?: None Help from another person taking care of personal grooming?: A Little Help from another person toileting, which includes using toliet, bedpan, or urinal?: A Little Help from another person bathing (including washing, rinsing, drying)?: A Little Help from another person to put on and taking off regular upper body clothing?: None Help from another person to put on and taking off regular lower body clothing?: A Little 6 Click Score: 20   End of Session Nurse Communication: Mobility status  Activity Tolerance: Patient tolerated treatment well Patient left: in bed  OT Visit Diagnosis: Muscle weakness (generalized) (M62.81);Repeated falls (R29.6);Unsteadiness on feet (R26.81)                Time: 6734-1937 OT Time Calculation (min): 22 min Charges:  OT General Charges $OT Visit: 1 Visit OT Evaluation $OT Eval Low Complexity: 1 Low OT Treatments $Self Care/Home Management : 8-22 mins  Darleen Crocker, MS, OTR/L , CBIS ascom [phone]  04/26/20, 11:16 AM

## 2020-04-26 NOTE — Discharge Instructions (Signed)
1.  Follow-up with PCP in 1 week. 2.  Follow-up with neurology in 2 weeks.

## 2020-04-26 NOTE — Evaluation (Signed)
Physical Therapy Evaluation Patient Details Name: Amy Hansen MRN: [id number] DOB: Sep 10, 1955 Today's Date: 04/26/2020   History of Present Illness  64 y.o. female with medical history significant for chronic UTI on chronic prophylactic Keflex, hypertension, migraine, insulin-dependent type 2 diabetes, chronic diastolic heart failure, GERD, restless leg syndrome, OCD who presents with worsening weakness and diplopia.Patient was recently admitted to the hospital with UTI secondary to Enterobacter.  She was just discharged on 9/26.  Since discharge, her diarrhea essentially resolved.  She does not have any fever chills, no urinary symptoms.  She has yet to finish her antibiotics.Stroke work-up in the hospital including MRI of the brain, MRA of the brain and the neck.  Clinical Impression  Patient received on stretcher. Husband present. Patient with flat affect, agreeable to PT assessment. She requires increased time for all mobility. Min guard for supine to sit. Min guard for sit to stand and min guard for ambulation using RW. She has slow cadence, requires multimodal cues for ambulation. Running into obstacles on her left with RW. Strength and sensation WNL bilaterally. She will continue to benefit from skilled PT while here to improve functional mobility and independence for safe return home.      Follow Up Recommendations Outpatient PT    Equipment Recommendations  None recommended by PT    Recommendations for Other Services       Precautions / Restrictions Precautions Precautions: Fall Restrictions Weight Bearing Restrictions: No      Mobility  Bed Mobility Overal bed mobility: Needs Assistance Bed Mobility: Supine to Sit;Sit to Supine Rolling: Supervision   Supine to sit: Min guard Sit to supine: Min assist   General bed mobility comments: min guard for safety coming off stretcher, min assist to bring legs back onto stretcher  Transfers Overall transfer level: Needs  assistance Equipment used: Rolling walker (2 wheeled) Transfers: Sit to/from Stand Sit to Stand: Min guard Stand pivot transfers: Min guard       General transfer comment: mild increased effort to stand but overall steady and safe  Ambulation/Gait Ambulation/Gait assistance: Min guard Gait Distance (Feet): 20 Feet Assistive device: Rolling walker (2 wheeled) Gait Pattern/deviations: Step-through pattern;Decreased stride length Gait velocity: decreased   General Gait Details: head down, cautious/hesitant with mobility. Requires mod multimodal cues. Slow to respond verbally and physically.  Stairs            Wheelchair Mobility    Modified Rankin (Stroke Patients Only)       Balance Overall balance assessment: Needs assistance Sitting-balance support: Feet supported Sitting balance-Leahy Scale: Good Sitting balance - Comments: steady sitting reaching outside BOS   Standing balance support: Bilateral upper extremity supported;During functional activity Standing balance-Leahy Scale: Fair Standing balance comment: reliance on AD                             Pertinent Vitals/Pain Pain Assessment: No/denies pain    Home Living Family/patient expects to be discharged to:: Private residence Living Arrangements: Spouse/significant other Available Help at Discharge: Family Type of Home: House Home Access: Stairs to enter Entrance Stairs-Rails: None Entrance Stairs-Number of Steps: 3 Home Layout: One level;Laundry or work area in [place]: Environmental consultant - 2 wheels;Bedside commode;Shower seat      Prior Function Level of Independence: Independent         Comments: She fell yesterday and has been using RW since, she was working at [employer] prior to her illness  Hand Dominance   Dominant Hand: Left    Extremity/Trunk Assessment   Upper Extremity Assessment Upper Extremity Assessment: Overall WFL for tasks assessed    Lower  Extremity Assessment Lower Extremity Assessment: Overall WFL for tasks assessed    Cervical / Trunk Assessment Cervical / Trunk Assessment: Normal  Communication   Communication: No difficulties  Cognition Arousal/Alertness: Awake/alert Behavior During Therapy: Flat affect;WFL for tasks assessed/performed Overall Cognitive Status: Within Functional Limits for tasks assessed                                 General Comments: patient slow to respond      General Comments      Exercises     Assessment/Plan    PT Assessment Patient needs continued PT services  PT Problem List Decreased strength;Decreased activity tolerance;Decreased balance;Decreased mobility;Decreased knowledge of use of DME;Decreased cognition       PT Treatment Interventions DME instruction;Gait training;Stair training;Functional mobility training;Therapeutic activities;Therapeutic exercise;Balance training;Patient/family education    PT Goals (Current goals can be found in the Care Plan section)  Acute Rehab PT Goals Patient Stated Goal: to go home PT Goal Formulation: With patient Time For Goal Achievement: 05/08/20 Potential to Achieve Goals: Good    Frequency Min 2X/week   Barriers to discharge Inaccessible home environment;Decreased caregiver support      Co-evaluation               AM-PAC PT "6 Clicks" Mobility  Outcome Measure Help needed turning from your back to your side while in a flat bed without using bedrails?: A Little Help needed moving from lying on your back to sitting on the side of a flat bed without using bedrails?: A Little Help needed moving to and from a bed to a chair (including a wheelchair)?: A Little Help needed standing up from a chair using your arms (e.g., wheelchair or bedside chair)?: A Little Help needed to walk in hospital room?: A Little Help needed climbing 3-5 steps with a railing? : A Little 6 Click Score: 18    End of Session Equipment  Utilized During Treatment: Gait belt Activity Tolerance: Patient tolerated treatment well Patient left: in bed;with call bell/phone within reach;with family/visitor present Nurse Communication: Mobility status PT Visit Diagnosis: Unsteadiness on feet (R26.81);Other abnormalities of gait and mobility (R26.89);Muscle weakness (generalized) (M62.81);History of falling (Z91.81);Difficulty in walking, not elsewhere classified (R26.2)    Time: 1443-1540 PT Time Calculation (min) (ACUTE ONLY): 16 min   Charges:   PT Evaluation $PT Eval Moderate Complexity: 1 Mod          Elton Heid, PT, GCS 04/26/20,12:20 PM

## 2020-04-26 NOTE — ED Notes (Signed)
Pt transported to MRI 

## 2020-04-26 NOTE — ED Notes (Signed)
This RN at bedside waiting for tele neurologist to come on cart at this time.

## 2020-04-26 NOTE — ED Notes (Signed)
Pt given orange juice at this time

## 2020-04-27 LAB — CELIAC DISEASE PANEL
Endomysial Ab, IgA: NEGATIVE
IgA: 150 mg/dL (ref 87–352)
Tissue Transglutaminase Ab, IgA: <2 U/mL (ref 0–3)

## 2020-04-29 ENCOUNTER — Telehealth: Payer: Self-pay

## 2020-04-29 LAB — METHYLMALONIC ACID, SERUM: Methylmalonic Acid, Quantitative: 126 nmol/L (ref 0–378)

## 2020-04-29 NOTE — Telephone Encounter (Signed)
Called pt to inform of results and Dr. Georgeann Oppenheim recommendations.  Unable to contact, LVM to return call

## 2020-04-29 NOTE — Telephone Encounter (Signed)
-----   Message from [name], MD sent at 04/28/2020  9:14 AM EDT ----- Amy Hansen inform stool test shows significant inflammation which can be from the NSAID's that she was taking- she has to follow up with her GI Dr [name], [name], PA-C   Dr [name] MD,MRCP [facility]) Gastroenterology/Hepatology Pager: [phone]

## 2020-04-30 ENCOUNTER — Encounter (HOSPITAL_COMMUNITY): Payer: Self-pay

## 2020-04-30 ENCOUNTER — Other Ambulatory Visit: Payer: Self-pay

## 2020-04-30 ENCOUNTER — Inpatient Hospital Stay (HOSPITAL_COMMUNITY)
Admission: EM | Admit: 2020-04-30 | Discharge: 2020-05-03 | DRG: 386 | Disposition: A | Discharge status: Home / Self Care | Payer: Self-pay | Attending: Internal Medicine | Admitting: Internal Medicine

## 2020-04-30 DIAGNOSIS — Z23 Encounter for immunization: Secondary | ICD-10-CM

## 2020-04-30 DIAGNOSIS — Z79890 Hormone replacement therapy (postmenopausal): Secondary | ICD-10-CM

## 2020-04-30 DIAGNOSIS — R933 Abnormal findings on diagnostic imaging of other parts of digestive tract: Secondary | ICD-10-CM

## 2020-04-30 DIAGNOSIS — E119 Type 2 diabetes mellitus without complications: Secondary | ICD-10-CM

## 2020-04-30 DIAGNOSIS — E876 Hypokalemia: Secondary | ICD-10-CM | POA: Diagnosis present

## 2020-04-30 DIAGNOSIS — Z87440 Personal history of urinary (tract) infections: Secondary | ICD-10-CM

## 2020-04-30 DIAGNOSIS — Z7982 Long term (current) use of aspirin: Secondary | ICD-10-CM

## 2020-04-30 DIAGNOSIS — Z9884 Bariatric surgery status: Secondary | ICD-10-CM

## 2020-04-30 DIAGNOSIS — K529 Noninfective gastroenteritis and colitis, unspecified: Secondary | ICD-10-CM | POA: Diagnosis present

## 2020-04-30 DIAGNOSIS — Z79899 Other long term (current) drug therapy: Secondary | ICD-10-CM

## 2020-04-30 DIAGNOSIS — I10 Essential (primary) hypertension: Secondary | ICD-10-CM | POA: Diagnosis present

## 2020-04-30 DIAGNOSIS — Z794 Long term (current) use of insulin: Secondary | ICD-10-CM

## 2020-04-30 DIAGNOSIS — Z90710 Acquired absence of both cervix and uterus: Secondary | ICD-10-CM

## 2020-04-30 DIAGNOSIS — R7982 Elevated C-reactive protein (CRP): Secondary | ICD-10-CM | POA: Diagnosis present

## 2020-04-30 DIAGNOSIS — Z888 Allergy status to other drugs, medicaments and biological substances status: Secondary | ICD-10-CM

## 2020-04-30 DIAGNOSIS — F419 Anxiety disorder, unspecified: Secondary | ICD-10-CM | POA: Diagnosis present

## 2020-04-30 DIAGNOSIS — R7989 Other specified abnormal findings of blood chemistry: Secondary | ICD-10-CM | POA: Diagnosis present

## 2020-04-30 DIAGNOSIS — K50111 Crohn's disease of large intestine with rectal bleeding: Principal | ICD-10-CM | POA: Diagnosis present

## 2020-04-30 DIAGNOSIS — D62 Acute posthemorrhagic anemia: Secondary | ICD-10-CM | POA: Diagnosis present

## 2020-04-30 DIAGNOSIS — Z20822 Contact with and (suspected) exposure to covid-19: Secondary | ICD-10-CM | POA: Diagnosis present

## 2020-04-30 DIAGNOSIS — Z9049 Acquired absence of other specified parts of digestive tract: Secondary | ICD-10-CM

## 2020-04-30 DIAGNOSIS — E1159 Type 2 diabetes mellitus with other circulatory complications: Secondary | ICD-10-CM | POA: Diagnosis present

## 2020-04-30 DIAGNOSIS — F32A Depression, unspecified: Secondary | ICD-10-CM | POA: Diagnosis present

## 2020-04-30 DIAGNOSIS — Z8249 Family history of ischemic heart disease and other diseases of the circulatory system: Secondary | ICD-10-CM

## 2020-04-30 DIAGNOSIS — G2581 Restless legs syndrome: Secondary | ICD-10-CM | POA: Diagnosis present

## 2020-04-30 DIAGNOSIS — Z87891 Personal history of nicotine dependence: Secondary | ICD-10-CM

## 2020-04-30 DIAGNOSIS — F429 Obsessive-compulsive disorder, unspecified: Secondary | ICD-10-CM | POA: Diagnosis present

## 2020-04-30 DIAGNOSIS — K625 Hemorrhage of anus and rectum: Secondary | ICD-10-CM | POA: Diagnosis present

## 2020-04-30 DIAGNOSIS — I959 Hypotension, unspecified: Secondary | ICD-10-CM | POA: Diagnosis present

## 2020-04-30 DIAGNOSIS — D638 Anemia in other chronic diseases classified elsewhere: Secondary | ICD-10-CM | POA: Diagnosis present

## 2020-04-30 DIAGNOSIS — G43909 Migraine, unspecified, not intractable, without status migrainosus: Secondary | ICD-10-CM | POA: Diagnosis present

## 2020-04-30 DIAGNOSIS — D509 Iron deficiency anemia, unspecified: Secondary | ICD-10-CM | POA: Diagnosis present

## 2020-04-30 DIAGNOSIS — N3010 Interstitial cystitis (chronic) without hematuria: Secondary | ICD-10-CM | POA: Diagnosis present

## 2020-04-30 DIAGNOSIS — I152 Hypertension secondary to endocrine disorders: Secondary | ICD-10-CM | POA: Diagnosis present

## 2020-04-30 DIAGNOSIS — Z881 Allergy status to other antibiotic agents status: Secondary | ICD-10-CM

## 2020-04-30 LAB — COMPREHENSIVE METABOLIC PANEL
ALT: 11 U/L (ref 0–44)
AST: 14 U/L — ABNORMAL LOW (ref 15–41)
Albumin: 1.8 g/dL — ABNORMAL LOW (ref 3.5–5.0)
Alkaline Phosphatase: 104 U/L (ref 38–126)
Anion gap: 10 (ref 5–15)
BUN: 5 mg/dL — ABNORMAL LOW (ref 8–23)
CO2: 19 mmol/L — ABNORMAL LOW (ref 22–32)
Calcium: 7.7 mg/dL — ABNORMAL LOW (ref 8.9–10.3)
Chloride: 103 mmol/L (ref 98–111)
Creatinine, Ser: 0.64 mg/dL (ref 0.44–1.00)
GFR calc Af Amer: >60 mL/min (ref >60)
GFR calc non Af Amer: >60 mL/min (ref >60)
Glucose, Bld: 172 mg/dL — ABNORMAL HIGH (ref 70–99)
Potassium: 3.3 mmol/L — ABNORMAL LOW (ref 3.5–5.1)
Sodium: 132 mmol/L — ABNORMAL LOW (ref 135–145)
Total Bilirubin: 0.2 mg/dL — ABNORMAL LOW (ref 0.3–1.2)
Total Protein: 5 g/dL — ABNORMAL LOW (ref 6.5–8.1)

## 2020-04-30 LAB — CBC
HCT: 30.3 % — ABNORMAL LOW (ref 36.0–46.0)
HCT: 27 % — ABNORMAL LOW (ref 36.0–46.0)
Hemoglobin: 9 g/dL — ABNORMAL LOW (ref 12.0–15.0)
Hemoglobin: 8.5 g/dL — ABNORMAL LOW (ref 12.0–15.0)
MCH: 21.6 pg — ABNORMAL LOW (ref 26.0–34.0)
MCH: 22.6 pg — ABNORMAL LOW (ref 26.0–34.0)
MCHC: 29.7 g/dL — ABNORMAL LOW (ref 30.0–36.0)
MCHC: 31.5 g/dL (ref 30.0–36.0)
MCV: 72.7 fL — ABNORMAL LOW (ref 80.0–100.0)
MCV: 71.8 fL — ABNORMAL LOW (ref 80.0–100.0)
Platelets: 470 10*3/uL — ABNORMAL HIGH (ref 150–400)
Platelets: 444 10*3/uL — ABNORMAL HIGH (ref 150–400)
RBC: 4.17 MIL/uL (ref 3.87–5.11)
RBC: 3.76 MIL/uL — ABNORMAL LOW (ref 3.87–5.11)
RDW: 24.8 % — ABNORMAL HIGH (ref 11.5–15.5)
RDW: 24.6 % — ABNORMAL HIGH (ref 11.5–15.5)
WBC: 8.3 10*3/uL (ref 4.0–10.5)
WBC: 5.5 10*3/uL (ref 4.0–10.5)
nRBC: 0 % (ref 0.0–0.2)
nRBC: 0 % (ref 0.0–0.2)

## 2020-04-30 LAB — POC OCCULT BLOOD, ED: Fecal Occult Bld: POSITIVE — AB

## 2020-04-30 MED ORDER — LACTATED RINGERS IV BOLUS
1000.0000 mL | Freq: Once | INTRAVENOUS | Status: AC
  Administered 2020-04-30: 1000 mL via INTRAVENOUS

## 2020-04-30 NOTE — ED Provider Notes (Signed)
[facility] EMERGENCY DEPARTMENT Provider Note   CSN: [id number] Arrival date & time: 04/30/20  1547     History No chief complaint on file.   Amy Hansen is a 64 y.o. female with history of diabetes, CKD, anemia who presents with bloody stool.  Patient endorses 5 weeks of diarrhea.  She was in the hospital last week and diagnosed with pancolitis.  She was C. difficile negative at that time, but did require transfusions for hemoglobin of 6.9.  She was discharged on Augmentin and has been extremely fatigued and lightheaded with continued diarrhea since then.  Denies vomiting, abdominal pain, dysuria, fevers.  Today, patient had 4 bowel movements that consisted of bright red blood per rectum with some blood clots.  No bleeding in between bowel movements.  No pain with defecation.   Rectal Bleeding Quality:  Bright red Amount:  Moderate Timing:  Intermittent Chronicity:  New Context: diarrhea   Context: not anal fissures, not foreign body, not rectal injury and not rectal pain   Similar prior episodes: no   Relieved by:  None tried Worsened by:  Nothing Ineffective treatments:  None tried Associated symptoms: light-headedness and recent illness   Associated symptoms: no abdominal pain, no fever, no hematemesis, no loss of consciousness and no vomiting   Risk factors: NSAID use        Past Medical History:  Diagnosis Date  . Anemia   . Anxiety   . Chronic kidney disease    UTI, hematuria in urine  . Depression   . Diabetes ([place])   . Diverticulosis   . Frequent headaches   . Interstitial cystitis   . Recurrent UTI   . Restless leg syndrome   . Urinary frequency     Patient Active Problem List   Diagnosis Date Noted  . Dysarthria 04/25/2020  . Weakness 04/25/2020  . Diplopia 04/25/2020  . Pancolitis ([place]) 04/25/2020  . Type 2 diabetes mellitus ([place]) 04/25/2020  . Diarrhea 04/21/2020  . Left lower quadrant abdominal pain 04/21/2020  . Colitis  04/21/2020  . Severe sepsis ([place]) 04/21/2020  . AKI (acute kidney injury) ([place]) 04/21/2020  . Hypokalemia 04/21/2020  . Hyponatremia 04/21/2020  . Anemia 04/21/2020  . Major depression, chronic 06/01/2018  . OCD (obsessive compulsive disorder) 06/01/2018  . Chronic heart failure with preserved ejection fraction ([place]) 05/16/2018  . Left leg pain 11/04/2017  . Atypical chest pain 03/05/2017  . GERD (gastroesophageal reflux disease) 02/18/2017  . Iron deficiency anemia 10/18/2016  . Foot swelling 07/17/2016  . Hypertension 03/21/2016  . Vitamin D deficiency 02/21/2016  . B12 deficiency 02/21/2016  . Incontinent of feces 02/21/2016  . Poorly controlled type 2 diabetes mellitus ([place]) 09/13/2015  . Dysuria 06/14/2015  . Migraines 05/23/2015  . Restless legs syndrome (RLS) 05/23/2015  . Recurrent UTI 04/26/2015  . Atrophic vaginitis 04/26/2015  . Dyspareunia, female 04/26/2015    Past Surgical History:  Procedure Laterality Date  . ABDOMINAL HYSTERECTOMY    . bariatric bypass  2012  . CARPAL TUNNEL RELEASE Right 2003  . CARPAL TUNNEL RELEASE Right    2008  . CHOLECYSTECTOMY  1975  . CYSTOSCOPY W/ RETROGRADES Bilateral 06/06/2015   Procedure: CYSTOSCOPY WITH RETROGRADE PYELOGRAM;  Surgeon: Festus Aloe, MD;  Location: [facility] ORS;  Service: Urology;  Laterality: Bilateral;  . FL INJ LEFT KNEE CT ARTHROGRAM ([facility] HX) Left    1995  . GASTRIC BYPASS  2010  . [place]  2013  . KNEE ARTHROSCOPY  Left 1996  . TONSILLECTOMY       OB History   No obstetric history on file.     Family History  Problem Relation Age of Onset  . Stroke Father   . Colon cancer Mother   . Heart failure Sister   . Bladder Cancer Neg Hx   . Kidney disease Neg Hx   . Prostate cancer Neg Hx   . Kidney cancer Neg Hx     Social History   Tobacco Use  . Smoking status: Former Smoker    Packs/day: 0.50    Types: Cigarettes    Quit date: 04/25/1975    Years since quitting: 45.0  .  Smokeless tobacco: Never Used  . Tobacco comment: quit 40 years  Vaping Use  . Vaping Use: Never used  Substance Use Topics  . Alcohol use: No    Alcohol/week: 0.0 standard drinks  . Drug use: No    Home Medications Prior to Admission medications   Medication Sig Start Date End Date Taking? Authorizing Provider  ALPRAZolam Duanne Moron) 0.5 MG tablet Take 1 tablet (0.5 mg total) by mouth 4 (four) times daily as needed for anxiety. 01/26/20   Addison Lank, PA-C  aspirin EC 81 MG tablet Take 1 tablet (81 mg total) by mouth daily. Swallow whole. 04/26/20 05/26/20  Sharen Hones, MD  buPROPion (WELLBUTRIN XL) 150 MG 24 hr tablet Take 3 tablets (450 mg total) by mouth daily. 12/16/19   Donnal Moat T, PA-C  estradiol (ESTRACE VAGINAL) 0.1 MG/GM vaginal cream Apply 0.84m (pea-sized amount)  just inside the vaginal introitus with a finger-tip on Monday, Wednesday and Friday nights. 04/07/18   MZara CouncilA, PA-C  ferrous sulfate 325 (65 FE) MG EC tablet Take 1 tablet (325 mg total) by mouth 2 (two) times daily. 04/24/20 04/24/21  ALorella Nimrod MD  furosemide (LASIX) 20 MG tablet Take 1 tablet (20 mg total) by mouth daily. 07/29/19   End, CHarrell Gave MD  glipiZIDE (GLUCOTROL) 5 MG tablet TAKE 1 TABLET BY MOUTH TWICE DAILY WITH MEALS (APPOINTMENT  REQUIRED  FOR  FUTURE  REFILLS) Patient taking differently: Take 5 mg by mouth 2 (two) times daily before a meal. TAKE 1 TABLET BY MOUTH TWICE DAILY WITH MEALS (APPOINTMENT  REQUIRED  FOR  FUTURE  REFILLS) 04/14/20   LVolney American PA-C  insulin glargine, 2 Unit Dial, (TOUJEO MAX SOLOSTAR) 300 UNIT/ML Solostar Pen Inject 24 units daily. Titrate as instructed. Max daily dose 40 units 01/20/20   LVolney American PA-C  Insulin Pen Needle (PEN NEEDLES) 32G X 4 MM MISC 1 Units by Does not apply route at bedtime. 12/20/19   LVolney American PA-C  lamoTRIgine (LAMICTAL) 200 MG tablet TAKE 2 TABLETS BY MOUTH AT BEDTIME Patient taking differently:  Take 400 mg by mouth at bedtime.  03/14/20   HDonnal MoatT, PA-C  loperamide (IMODIUM) 2 MG capsule Take 1 capsule (2 mg total) by mouth as needed for diarrhea or loose stools. 04/24/20   ALorella Nimrod MD  metFORMIN (GLUCOPHAGE) 1000 MG tablet TAKE 1 TABLET BY MOUTH TWICE DAILY WITH MEALS . APPOINTMENT REQUIRED FOR FUTURE REFILLS Patient taking differently: Take 1,000 mg by mouth 2 (two) times daily with a meal. TAKE 1 TABLET BY MOUTH TWICE DAILY WITH MEALS . APPOINTMENT REQUIRED FOR FUTURE REFILLS 04/14/20   LVolney American PA-C  Multiple Vitamin (MULTIVITAMIN WITH MINERALS) TABS tablet Take 1 tablet by mouth daily. 04/25/20   ALorella Nimrod MD  pramipexole (  MIRAPEX) 1 MG tablet Take 1 tablet (1 mg total) by mouth at bedtime. 12/20/19   Volney American, PA-C  promethazine (PHENERGAN) 25 MG tablet Take 1 tablet (25 mg total) by mouth every 8 (eight) hours as needed for nausea or vomiting. Patient not taking: Reported on 04/25/2020 09/11/19   Volney American, PA-C  risperiDONE (RISPERDAL) 3 MG tablet TAKE 1 TABLET BY MOUTH EVERY DAY AT BEDTIME Patient taking differently: Take 3 mg by mouth at bedtime.  02/16/20   Donnal Moat T, PA-C  sertraline (ZOLOFT) 100 MG tablet Take 1.5 tablets (150 mg total) by mouth daily. 12/16/19   Donnal Moat T, PA-C    Allergies    Avelox [moxifloxacin hcl in nacl], Bactrim [sulfamethoxazole-trimethoprim], Ciprofloxacin, Depakote [divalproex sodium], Imitrex [sumatriptan], and Stadol [butorphanol]  Review of Systems   Review of Systems  Constitutional: Negative for chills and fever.  HENT: Negative for ear pain and sore throat.   Eyes: Negative for pain and visual disturbance.  Respiratory: Negative for cough and shortness of breath.   Cardiovascular: Negative for chest pain and palpitations.  Gastrointestinal: Positive for blood in stool, diarrhea, hematochezia and nausea. Negative for abdominal pain, hematemesis and vomiting.  Genitourinary:  Negative for dysuria and hematuria.  Musculoskeletal: Negative for arthralgias and back pain.  Skin: Negative for color change and rash.  Neurological: Positive for light-headedness. Negative for seizures, loss of consciousness and syncope.  All other systems reviewed and are negative.   Physical Exam Updated Vital Signs BP 115/61   Pulse 88   Temp 98.1 F (36.7 C) (Oral)   Resp 20   LMP  (LMP Unknown)   SpO2 99%   Physical Exam Vitals and nursing note reviewed.  Constitutional:      Appearance: She is well-developed.     Comments: Appears fatigued.  HENT:     Head: Normocephalic and atraumatic.     Mouth/Throat:     Mouth: Mucous membranes are moist.     Pharynx: Oropharynx is clear.  Eyes:     Extraocular Movements: Extraocular movements intact.     Comments: Pale conjunctiva.  Cardiovascular:     Rate and Rhythm: Normal rate and regular rhythm.     Heart sounds: No murmur heard.   Pulmonary:     Effort: Pulmonary effort is normal. No respiratory distress.     Breath sounds: Normal breath sounds.  Abdominal:     Palpations: Abdomen is soft.     Tenderness: There is no abdominal tenderness.  Genitourinary:    Comments: No gross blood on DRE.  Patient has skin irritation around her rectum, but no visible hemorrhoids or fissures.  Performed with RN chaperone. Musculoskeletal:     Cervical back: Neck supple.  Skin:    General: Skin is warm and dry.     Coloration: Skin is pale.  Neurological:     General: No focal deficit present.     Mental Status: She is alert and oriented to person, place, and time.     ED Results / Procedures / Treatments   Labs (all labs ordered are listed, but only abnormal results are displayed) Labs Reviewed  COMPREHENSIVE METABOLIC PANEL - Abnormal; Notable for the following components:      Result Value   Sodium 132 (*)    Potassium 3.3 (*)    CO2 19 (*)    Glucose, Bld 172 (*)    BUN 5 (*)    Calcium 7.7 (*)    Total Protein  5.0 (*)    Albumin 1.8 (*)    AST 14 (*)    Total Bilirubin 0.2 (*)    All other components within normal limits  CBC - Abnormal; Notable for the following components:   Hemoglobin 9.0 (*)    HCT 30.3 (*)    MCV 72.7 (*)    MCH 21.6 (*)    MCHC 29.7 (*)    RDW 24.8 (*)    Platelets 470 (*)    All other components within normal limits  CBC - Abnormal; Notable for the following components:   RBC 3.76 (*)    Hemoglobin 8.5 (*)    HCT 27.0 (*)    MCV 71.8 (*)    MCH 22.6 (*)    RDW 24.6 (*)    Platelets 444 (*)    All other components within normal limits  POC OCCULT BLOOD, ED - Abnormal; Notable for the following components:   Fecal Occult Bld POSITIVE (*)    All other components within normal limits  GASTROINTESTINAL PANEL BY PCR, STOOL (REPLACES STOOL CULTURE)  RESPIRATORY PANEL BY RT PCR (FLU A&B, COVID)  TYPE AND SCREEN    EKG None  Radiology No results found.  Procedures Procedures (including critical care time)  Medications Ordered in ED Medications  lactated ringers bolus 1,000 mL (1,000 mLs Intravenous New Bag/Given 04/30/20 2313)    ED Course  I have reviewed the triage vital signs and the nursing notes.  Pertinent labs & imaging results that were available during my care of the patient were reviewed by me and considered in my medical decision making (see chart for details).    MDM Rules/Calculators/A&P                          On arrival, patient mildly hypotensive to 90/61 and tachycardic to 106.  Positive orthostatics.  Improved after 1 L fluid.  Patient is supposed to follow-up in 1 month with GI for EGD/colonoscopy in the setting of presumed lower GI bleed from her hospital admission.  She did require transfusions during that admission.  She was told that they would not scope her until her colitis had resolved.Upon discharge from the hospital 5 days ago, hemoglobin 10.9.  Today, hemoglobin 9.  Patient had additional bloody bowel movement in the ED,  and repeat hemoglobin 8.5 after 6 hours.  Patient is very uncomfortable with going home and prefer to stay in the hospital for hemoglobin monitoring.  Admit to hospitalist for further management.  This patient was seen with Dr. Maryan Rued. Final Clinical Impression(s) / ED Diagnoses Final diagnoses:  Rectal bleeding  Anemia due to acute blood loss    Rx / DC Orders ED Discharge Orders    None       Asencion Noble, MD 05/01/20 2229    Blanchie Dessert, MD 05/02/20 2204

## 2020-04-30 NOTE — ED Triage Notes (Signed)
Patient reports that she has had diarrhea x 5 weeks and just recently diagnosed with colitis. Patient states that today the diarrhea is bloody. Patient alert and oriented, denis pain

## 2020-05-01 DIAGNOSIS — K625 Hemorrhage of anus and rectum: Secondary | ICD-10-CM | POA: Diagnosis present

## 2020-05-01 LAB — C DIFFICILE QUICK SCREEN W PCR REFLEX
C Diff antigen: NEGATIVE
C Diff interpretation: NOT DETECTED
C Diff toxin: NEGATIVE

## 2020-05-01 LAB — GASTROINTESTINAL PANEL BY PCR, STOOL (REPLACES STOOL CULTURE)

## 2020-05-01 LAB — CBG MONITORING, ED
Glucose-Capillary: 100 mg/dL — ABNORMAL HIGH (ref 70–99)
Glucose-Capillary: 113 mg/dL — ABNORMAL HIGH (ref 70–99)
Glucose-Capillary: 96 mg/dL (ref 70–99)
Glucose-Capillary: 143 mg/dL — ABNORMAL HIGH (ref 70–99)

## 2020-05-01 LAB — RESPIRATORY PANEL BY RT PCR (FLU A&B, COVID)
Influenza A by PCR: NEGATIVE
Influenza B by PCR: NEGATIVE
SARS Coronavirus 2 by RT PCR: NEGATIVE

## 2020-05-01 LAB — HEMOGLOBIN AND HEMATOCRIT, BLOOD
HCT: 24.6 % — ABNORMAL LOW (ref 36.0–46.0)
HCT: 25.9 % — ABNORMAL LOW (ref 36.0–46.0)
Hemoglobin: 7.4 g/dL — ABNORMAL LOW (ref 12.0–15.0)
Hemoglobin: 7.8 g/dL — ABNORMAL LOW (ref 12.0–15.0)

## 2020-05-01 LAB — BASIC METABOLIC PANEL
Anion gap: 6 (ref 5–15)
BUN: <5 mg/dL — ABNORMAL LOW (ref 8–23)
CO2: 25 mmol/L (ref 22–32)
Calcium: 7.9 mg/dL — ABNORMAL LOW (ref 8.9–10.3)
Chloride: 105 mmol/L (ref 98–111)
Creatinine, Ser: 0.5 mg/dL (ref 0.44–1.00)
GFR calc Af Amer: >60 mL/min (ref >60)
GFR calc non Af Amer: >60 mL/min (ref >60)
Glucose, Bld: 103 mg/dL — ABNORMAL HIGH (ref 70–99)
Potassium: 3.5 mmol/L (ref 3.5–5.1)
Sodium: 136 mmol/L (ref 135–145)

## 2020-05-01 LAB — CBC
HCT: 25.1 % — ABNORMAL LOW (ref 36.0–46.0)
Hemoglobin: 7.7 g/dL — ABNORMAL LOW (ref 12.0–15.0)
MCH: 22.2 pg — ABNORMAL LOW (ref 26.0–34.0)
MCHC: 30.7 g/dL (ref 30.0–36.0)
MCV: 72.3 fL — ABNORMAL LOW (ref 80.0–100.0)
Platelets: 435 10*3/uL — ABNORMAL HIGH (ref 150–400)
RBC: 3.47 MIL/uL — ABNORMAL LOW (ref 3.87–5.11)
RDW: 24.9 % — ABNORMAL HIGH (ref 11.5–15.5)
WBC: 4.5 10*3/uL (ref 4.0–10.5)
nRBC: 0 % (ref 0.0–0.2)

## 2020-05-01 LAB — GLUCOSE, CAPILLARY
Glucose-Capillary: 108 mg/dL — ABNORMAL HIGH (ref 70–99)
Glucose-Capillary: 122 mg/dL — ABNORMAL HIGH (ref 70–99)

## 2020-05-01 MED ORDER — PRAMIPEXOLE DIHYDROCHLORIDE 1 MG PO TABS
1.0000 mg | ORAL_TABLET | Freq: Every day | ORAL | Status: DC
  Administered 2020-05-01 (×2): 1 mg via ORAL
  Filled 2020-05-01: qty 1
  Filled 2020-05-01: qty 8
  Filled 2020-05-01 (×3): qty 1
  Filled 2020-05-01: qty 8

## 2020-05-01 MED ORDER — LAMOTRIGINE 100 MG PO TABS
400.0000 mg | ORAL_TABLET | Freq: Every day | ORAL | Status: DC
  Administered 2020-05-01 – 2020-05-02 (×3): 400 mg via ORAL
  Filled 2020-05-01: qty 1
  Filled 2020-05-01 (×2): qty 4
  Filled 2020-05-01: qty 1

## 2020-05-01 MED ORDER — BUPROPION HCL ER (XL) 150 MG PO TB24
450.0000 mg | ORAL_TABLET | Freq: Every day | ORAL | Status: DC
  Administered 2020-05-01 – 2020-05-03 (×3): 450 mg via ORAL
  Filled 2020-05-01 (×3): qty 3

## 2020-05-01 MED ORDER — ACETAMINOPHEN 500 MG PO TABS
1000.0000 mg | ORAL_TABLET | Freq: Four times a day (QID) | ORAL | Status: DC | PRN
  Administered 2020-05-01 – 2020-05-03 (×5): 1000 mg via ORAL
  Filled 2020-05-01 (×5): qty 2

## 2020-05-01 MED ORDER — ALPRAZOLAM 0.5 MG PO TABS
0.5000 mg | ORAL_TABLET | Freq: Four times a day (QID) | ORAL | Status: DC | PRN
  Administered 2020-05-01 – 2020-05-02 (×3): 0.5 mg via ORAL
  Filled 2020-05-01 (×3): qty 1

## 2020-05-01 MED ORDER — ONDANSETRON HCL 4 MG PO TABS: 4.0000 mg | ORAL_TABLET | Freq: Four times a day (QID) | ORAL | Status: DC | PRN

## 2020-05-01 MED ORDER — PANTOPRAZOLE SODIUM 40 MG IV SOLR
40.0000 mg | Freq: Once | INTRAVENOUS | Status: AC
  Administered 2020-05-01: 40 mg via INTRAVENOUS
  Filled 2020-05-01: qty 40

## 2020-05-01 MED ORDER — INSULIN ASPART 100 UNIT/ML ~~LOC~~ SOLN
0.0000 [IU] | SUBCUTANEOUS | Status: DC
  Administered 2020-05-02 (×2): 1 [IU] via SUBCUTANEOUS
  Administered 2020-05-03: 3 [IU] via SUBCUTANEOUS

## 2020-05-01 MED ORDER — SODIUM CHLORIDE 0.9 % IV SOLN
80.0000 mg | Freq: Once | INTRAVENOUS | Status: AC
  Administered 2020-05-01: 80 mg via INTRAVENOUS
  Filled 2020-05-01: qty 80

## 2020-05-01 MED ORDER — POTASSIUM CHLORIDE 20 MEQ/15ML (10%) PO SOLN
40.0000 meq | Freq: Once | ORAL | Status: DC
  Filled 2020-05-01: qty 30

## 2020-05-01 MED ORDER — PANTOPRAZOLE SODIUM 40 MG IV SOLR: 40.0000 mg | Freq: Two times a day (BID) | INTRAVENOUS | Status: DC

## 2020-05-01 MED ORDER — RISPERIDONE 3 MG PO TABS
3.0000 mg | ORAL_TABLET | Freq: Every day | ORAL | Status: DC
  Administered 2020-05-01 – 2020-05-02 (×3): 3 mg via ORAL
  Filled 2020-05-01 (×5): qty 1

## 2020-05-01 MED ORDER — ONDANSETRON HCL 4 MG/2ML IJ SOLN: 4.0000 mg | Freq: Four times a day (QID) | INTRAMUSCULAR | Status: DC | PRN

## 2020-05-01 MED ORDER — POTASSIUM CHLORIDE 2 MEQ/ML IV SOLN
INTRAVENOUS | Status: AC
  Filled 2020-05-01 (×2): qty 1000

## 2020-05-01 MED ORDER — SODIUM CHLORIDE 0.9 % IV SOLN
8.0000 mg/h | INTRAVENOUS | Status: DC
  Administered 2020-05-01 – 2020-05-02 (×2): 8 mg/h via INTRAVENOUS
  Filled 2020-05-01 (×3): qty 80

## 2020-05-01 MED ORDER — SERTRALINE HCL 50 MG PO TABS
150.0000 mg | ORAL_TABLET | Freq: Every day | ORAL | Status: DC
  Administered 2020-05-01 – 2020-05-03 (×3): 150 mg via ORAL
  Filled 2020-05-01 (×3): qty 1

## 2020-05-01 MED ORDER — SODIUM CHLORIDE 0.9% FLUSH
3.0000 mL | Freq: Two times a day (BID) | INTRAVENOUS | Status: DC
  Administered 2020-05-02 – 2020-05-03 (×3): 3 mL via INTRAVENOUS

## 2020-05-01 NOTE — H&P (Signed)
History and Physical    Amy Hansen YYT:[id number] DOB: November 05, 1955 DOA: 04/30/2020  PCP: Volney American, PA-C  Patient coming from: Home  I have personally briefly reviewed patient's old medical records in [place]  Chief Complaint: Bloody diarrhea  HPI: Amy Hansen is a 64 y.o. female with medical history significant for type 2 diabetes, hypertension, anxiety/depression, OCD, RLS who presents to the ED for evaluation of bloody diarrhea.  Patient was recently admitted at [facility] hospital from 9/23-9/26 for pancolitis.  She was treated with IV Zosyn while in hospital and discharged on Augmentin.  She had associated rectal bleeding requiring 2 unit PRBC transfusions.  Colitis and GI bleeding were thought to be NSAID related.  C. difficile and GI pathogen panels were negative.  Patient states that since discharge she has been having continued black color watery diarrhea up to 5-6 times per day.  She has had poor oral intake with associated nausea and nonbloody nonbilious emesis.  Yesterday she developed bright red bloody diarrhea.  She has had lightheadedness and dizziness without syncope.  Patient states she was told to discontinue Excedrin which she has avoided.  However she says that she has been continuing to alternate ibuprofen with Tylenol for management of her back pain.  She was also back in the hospital 9/27-9/28 for generalized weakness.  She underwent stroke work-up without evidence of CVA.  There is question of TIA and she was started on aspirin 81 mg daily which she has been taking since leaving the hospital.  ED Course:  Initial vitals showed BP 90/61, pulse 106, RR 16, temp 98.1 Fahrenheit, SPO2 100% on room air.  Labs notable for sodium 132, potassium 3.3, bicarb 19, BUN 5, creatinine 0.64, serum glucose 172, AST 14, ALT 11, alk phos 104, total bilirubin 0.2, WBC 8.3, hemoglobin 9.0 (previously 10.9 on 04/25/2020), platelets 470,000.  Patient was given 1 L LR  with improvement in blood pressure.  Repeat CBC was obtained which showed repeat hemoglobin of 8.5.  FOBT was positive.  Patient had a bloody bowel movement while in the ED.  GI pathogen panel was obtained and pending.  Respiratory PCR panel was obtained and pending.  The hospitalist service was consulted to admit for further evaluation and management.  Review of Systems: All systems reviewed and are negative except as documented in history of present illness above.   Past Medical History:  Diagnosis Date  . Anemia   . Anxiety   . Chronic kidney disease    UTI, hematuria in urine  . Depression   . Diabetes ([place])   . Diverticulosis   . Frequent headaches   . Interstitial cystitis   . Recurrent UTI   . Restless leg syndrome   . Urinary frequency     Past Surgical History:  Procedure Laterality Date  . ABDOMINAL HYSTERECTOMY    . bariatric bypass  2012  . CARPAL TUNNEL RELEASE Right 2003  . CARPAL TUNNEL RELEASE Right    2008  . CHOLECYSTECTOMY  1975  . CYSTOSCOPY W/ RETROGRADES Bilateral 06/06/2015   Procedure: CYSTOSCOPY WITH RETROGRADE PYELOGRAM;  Surgeon: Festus Aloe, MD;  Location: [facility] ORS;  Service: Urology;  Laterality: Bilateral;  . FL INJ LEFT KNEE CT ARTHROGRAM ([facility] HX) Left    1995  . GASTRIC BYPASS  2010  . [place]  2013  . KNEE ARTHROSCOPY Left 1996  . TONSILLECTOMY      Social History:  reports that she quit smoking about 45 years  ago. Her smoking use included cigarettes. She smoked 0.50 packs per day. She has never used smokeless tobacco. She reports that she does not drink alcohol and does not use drugs.  Allergies  Allergen Reactions  . Avelox [Moxifloxacin Hcl In Nacl] Anaphylaxis  . Bactrim [Sulfamethoxazole-Trimethoprim] Anaphylaxis  . Ciprofloxacin Other (See Comments)    Pt states she was told never to take this as it is in the same family as Avelox.   . Depakote [Divalproex Sodium]   . Imitrex [Sumatriptan] Other (See Comments)     Neck and shoulder pain  . Stadol [Butorphanol] Rash    Family History  Problem Relation Age of Onset  . Stroke Father   . Colon cancer Mother   . Heart failure Sister   . Bladder Cancer Neg Hx   . Kidney disease Neg Hx   . Prostate cancer Neg Hx   . Kidney cancer Neg Hx      Prior to Admission medications   Medication Sig Start Date End Date Taking? Authorizing Provider  ALPRAZolam Duanne Moron) 0.5 MG tablet Take 1 tablet (0.5 mg total) by mouth 4 (four) times daily as needed for anxiety. 01/26/20  Yes Addison Lank, PA-C  aspirin EC 81 MG tablet Take 1 tablet (81 mg total) by mouth daily. Swallow whole. 04/26/20 05/26/20 Yes Sharen Hones, MD  buPROPion (WELLBUTRIN XL) 150 MG 24 hr tablet Take 3 tablets (450 mg total) by mouth daily. 12/16/19  Yes Donnal Moat T, PA-C  estradiol (ESTRACE VAGINAL) 0.1 MG/GM vaginal cream Apply 0.90m (pea-sized amount)  just inside the vaginal introitus with a finger-tip on Monday, Wednesday and Friday nights. 04/07/18  Yes McGowan, SLarene BeachA, PA-C  ferrous sulfate 325 (65 FE) MG EC tablet Take 1 tablet (325 mg total) by mouth 2 (two) times daily. 04/24/20 04/24/21 Yes ALorella Nimrod MD  furosemide (LASIX) 20 MG tablet Take 1 tablet (20 mg total) by mouth daily. 07/29/19  Yes End, CHarrell Gave MD  glipiZIDE (GLUCOTROL) 5 MG tablet TAKE 1 TABLET BY MOUTH TWICE DAILY WITH MEALS (APPOINTMENT  REQUIRED  FOR  FUTURE  REFILLS) Patient taking differently: Take 5 mg by mouth 2 (two) times daily before a meal. TAKE 1 TABLET BY MOUTH TWICE DAILY WITH MEALS (APPOINTMENT  REQUIRED  FOR  FUTURE  REFILLS) 04/14/20  Yes LVolney American PA-C  insulin glargine, 2 Unit Dial, (TOUJEO MAX SOLOSTAR) 300 UNIT/ML Solostar Pen Inject 24 units daily. Titrate as instructed. Max daily dose 40 units 01/20/20  Yes LVolney American PA-C  lamoTRIgine (LAMICTAL) 200 MG tablet TAKE 2 TABLETS BY MOUTH AT BEDTIME Patient taking differently: Take 400 mg by mouth at bedtime.  03/14/20   Yes HDonnal MoatT, PA-C  loperamide (IMODIUM) 2 MG capsule Take 1 capsule (2 mg total) by mouth as needed for diarrhea or loose stools. 04/24/20  Yes ALorella Nimrod MD  metFORMIN (GLUCOPHAGE) 1000 MG tablet TAKE 1 TABLET BY MOUTH TWICE DAILY WITH MEALS . APPOINTMENT REQUIRED FOR FUTURE REFILLS Patient taking differently: Take 1,000 mg by mouth 2 (two) times daily with a meal. TAKE 1 TABLET BY MOUTH TWICE DAILY WITH MEALS . APPOINTMENT REQUIRED FOR FUTURE REFILLS 04/14/20  Yes LVolney American PA-C  Multiple Vitamin (MULTIVITAMIN WITH MINERALS) TABS tablet Take 1 tablet by mouth daily. 04/25/20  Yes ALorella Nimrod MD  pramipexole (MIRAPEX) 1 MG tablet Take 1 tablet (1 mg total) by mouth at bedtime. 12/20/19  Yes LVolney American PA-C  risperiDONE (RISPERDAL) 3 MG tablet TAKE  1 TABLET BY MOUTH EVERY DAY AT BEDTIME Patient taking differently: Take 3 mg by mouth at bedtime.  02/16/20  Yes Donnal Moat T, PA-C  sertraline (ZOLOFT) 100 MG tablet Take 1.5 tablets (150 mg total) by mouth daily. 12/16/19  Yes Hurst, Dorothea Glassman, PA-C  Insulin Pen Needle (PEN NEEDLES) 32G X 4 MM MISC 1 Units by Does not apply route at bedtime. 12/20/19   Volney American, PA-C  promethazine (PHENERGAN) 25 MG tablet Take 1 tablet (25 mg total) by mouth every 8 (eight) hours as needed for nausea or vomiting. Patient not taking: Reported on 04/25/2020 09/11/19   Volney American, PA-C    Physical Exam: Vitals:   04/30/20 2000 04/30/20 2015 04/30/20 2345 05/01/20 0000  BP: 114/71 111/69 112/65 115/61  Pulse: 84 87 83 88  Resp:   17 20  Temp:      TempSrc:      SpO2: 100% 100% 99% 99%   Constitutional: Resting supine in bed, NAD, calm, comfortable Eyes: PERRL, lids and conjunctivae normal ENMT: Mucous membranes are moist. Posterior pharynx clear of any exudate or lesions.Normal dentition.  Neck: normal, supple, no masses. Respiratory: clear to auscultation bilaterally, no wheezing, no crackles. Normal  respiratory effort. No accessory muscle use.  Cardiovascular: Regular rate and rhythm, no murmurs / rubs / gallops. No extremity edema. 2+ pedal pulses. Abdomen: no tenderness, no masses palpated. No hepatosplenomegaly. Bowel sounds positive.  Musculoskeletal: no clubbing / cyanosis. No joint deformity upper and lower extremities. Good ROM, no contractures. Normal muscle tone.  Skin: no rashes, lesions, ulcers. No induration Neurologic: CN 2-12 grossly intact. Sensation intact,Strength 5/5 in all 4.  Psychiatric: Normal judgment and insight. Alert and oriented x 3. Normal mood.    Labs on Admission: I have personally reviewed following labs and imaging studies  CBC: Recent Labs  Lab 04/24/20 0416 04/25/20 1201 04/30/20 1619 04/30/20 2213  WBC 4.4 5.5 8.3 5.5  NEUTROABS  --  3.9  --   --   HGB 9.4* 10.9* 9.0* 8.5*  HCT 27.9* 34.3* 30.3* 27.0*  MCV 65.0* 69.2* 72.7* 71.8*  PLT 574* 675* 470* 010*   Basic Metabolic Panel: Recent Labs  Lab 04/24/20 0416 04/25/20 1201 04/30/20 1619  NA 135 135 132*  K 3.0* 3.6 3.3*  CL 96* 97* 103  CO2 28 26 19*  GLUCOSE 112* 143* 172*  BUN <5* <5* 5*  CREATININE 0.48 0.55 0.64  CALCIUM 7.9* 8.3* 7.7*  MG 1.7  --   --    GFR: Estimated Creatinine Clearance: 57.7 mL/min (by C-G formula based on SCr of 0.64 mg/dL). Liver Function Tests: Recent Labs  Lab 04/25/20 1201 04/30/20 1619  AST 15 14*  ALT 13 11  ALKPHOS 176* 104  BILITOT 0.5 0.2*  PROT 6.3* 5.0*  ALBUMIN 2.3* 1.8*   No results for input(s): LIPASE, AMYLASE in the last 168 hours. No results for input(s): AMMONIA in the last 168 hours. Coagulation Profile: Recent Labs  Lab 04/25/20 1201  INR 0.9   Cardiac Enzymes: No results for input(s): CKTOTAL, CKMB, CKMBINDEX, TROPONINI in the last 168 hours. BNP (last 3 results) No results for input(s): PROBNP in the last 8760 hours. HbA1C: No results for input(s): HGBA1C in the last 72 hours. CBG: Recent Labs  Lab  04/24/20 0745 04/24/20 1123 04/25/20 1200 04/25/20 2325 04/26/20 0804  GLUCAP 182* 187* 149* 116* 155*   Lipid Profile: No results for input(s): CHOL, HDL, LDLCALC, TRIG, CHOLHDL, LDLDIRECT in the  last 72 hours. Thyroid Function Tests: No results for input(s): TSH, T4TOTAL, FREET4, T3FREE, THYROIDAB in the last 72 hours. Anemia Panel: No results for input(s): VITAMINB12, FOLATE, FERRITIN, TIBC, IRON, RETICCTPCT in the last 72 hours. Urine analysis:    Component Value Date/Time   COLORURINE YELLOW (A) 04/21/2020 1747   APPEARANCEUR CLOUDY (A) 04/21/2020 1747   APPEARANCEUR Clear 12/14/2019 1040   LABSPEC 1.011 04/21/2020 1747   LABSPEC 1.003 01/09/2012 2126   PHURINE 5.0 04/21/2020 1747   GLUCOSEU NEGATIVE 04/21/2020 1747   GLUCOSEU Negative 01/09/2012 2126   HGBUR NEGATIVE 04/21/2020 [place] 04/21/2020 1747   BILIRUBINUR Negative 12/14/2019 1040   BILIRUBINUR Negative 01/09/2012 2126   KETONESUR NEGATIVE 04/21/2020 1747   PROTEINUR NEGATIVE 04/21/2020 1747   NITRITE POSITIVE (A) 04/21/2020 1747   LEUKOCYTESUR LARGE (A) 04/21/2020 1747   LEUKOCYTESUR Negative 01/09/2012 2126    Radiological Exams on Admission: No results found.  EKG: Not performed.  Assessment/Plan Principal Problem:   BRBPR (bright red blood per rectum) Active Problems:   Hypertension   Iron deficiency anemia   Hypokalemia   Type 2 diabetes mellitus ([place])  Amy Hansen is a 64 y.o. female with medical history significant for type 2 diabetes, hypertension, anxiety/depression, OCD, RLS who is admitted with rectal bleeding.  Diarrhea with rectal bleeding Recent pancolitis Iron deficiency anemia:  Recent admit for pancolitis with negative GI pathogen and C. difficile panels.  She was treated with Zosyn and discharged on oral antibiotics.  There is suspicion for NSAID mediated colitis and GI bleeding.  She was advised to discontinue Excedrin which she has stopped.  She was  started on aspirin 81 mg daily for questionable TIA with significant hospital 9/27-9/28.  She also is taking ibuprofen for back pain. -Hemoglobin 9.0 > 8.5 while in the ED -Repeat CBC in a.m., transfuse for hemoglobin <7.0 -Patient advised to avoid all NSAIDs including ibuprofen -Need to reconsider aspirin use without clear history of CVA, will hold for now -Continue IV fluid hydration overnight -Follow GI pathogen panel  Hypokalemia: Replete and recheck in a.m.  Type 2 diabetes: Hold home meds and place on sensitive SSI while NPO.  Anxiety/depression/OCD/RLS: Resume home sertraline, Risperdal, Lamictal, Wellbutrin, Mirapex, and Xanax as needed with hold parameters.  DVT prophylaxis: SCDs Code Status: Full code, confirmed with patient Family Communication: Discussed with patient, she has discussed with family Disposition Plan: From home and likely discharge to home pending symptomatic improvement Consults called: None Admission status:  Status is: Observation  The patient remains OBS appropriate and will d/c before 2 midnights.  Dispo: The patient is from: Home              Anticipated d/c is to: Home              Anticipated d/c date is: 1 day              Patient currently is not medically stable to d/c.   Zada Finders MD [facility]  If 7PM-7AM, please contact night-coverage [email or web address]  05/01/2020, 1:55 AM

## 2020-05-01 NOTE — ED Notes (Signed)
Pt's CBG result was 96. Informed Joe - RN.

## 2020-05-01 NOTE — Progress Notes (Signed)
The patient is a 64 yr old woman who was admitted by my colleague, Dr. Posey Pronto this morning. She presented to [facility] with complaints of black watery diarrhea up to 5-6 x a day after discharge from [facility] on 04/24/2020 for pancolitis with rectal bleeding requiring transfusion of 2 units of PRBC's. This colitis was thought to be NSAID related. Records indicate that the patient was told not to take NSAIDS at home, although the patient asserts that she was only told to avoid aspirin. She thinks that she was told that she could take NSAIDS.  She came back to the hospital on 9/27-9/28 for generalized weakness. Stroke work-up was performed on this occasion and was found to be negative for CVA. She was sent home on ASA 81 mg daily for a presumed TIA.  The patient states that her diarrhea turned bright red yesterday. She states taht she has not been able to eat due to nausea  She has been lightheaded, but has not passed out.   In the ED she was found to be hypotensive with systolic in the 31'S. Pulse was elevated at 106. Her temperature was normal and her SaO2 was 100% on room air.   Lab demonstrated K of 3.3, Bicarbonate of 19, and hemoglobin of 9.0. Her hemoglobin had been 10.8 on 9/27. Repeat Hgb demonstrate a drop in hemoglobin to 8.5 . FOBT is positive. GI pathogen panel was negative.   This morning the patient's vitals were within normal limits. She continues to saturate 100% on room air. Hemoglobin has now dropped to 7.4. Will transfuse for hemoglobin less than 7.0. She is receiving IV protonix.   The patient is awake, alert, and oriented x 3. No acute distress. Heart and lung sounds are within normal limits. Abdomen is soft, non-tender, non-distended. No distention or tenderness. Bowel sounds are normoactive. Extremities are without cyanosis, clubbing, or edema.  Will continue protonix. Monitor hemoglobin and transfuse for less than 7.0. The patient will be on a clear liquid diet. Consider GI consult should  the patient's hemoglobin continue to drop. At discharge on 04/24/2020, they had planned to scope the patient as outpatient after she had recovered from her pancolitis.

## 2020-05-01 NOTE — ED Notes (Signed)
Lunch Tray Ordered @ 0740.

## 2020-05-01 NOTE — ED Notes (Signed)
Gave pt diet ginger ale and quickly checked on her. Pt stated she was fine

## 2020-05-02 DIAGNOSIS — D509 Iron deficiency anemia, unspecified: Secondary | ICD-10-CM

## 2020-05-02 DIAGNOSIS — K625 Hemorrhage of anus and rectum: Secondary | ICD-10-CM

## 2020-05-02 DIAGNOSIS — R933 Abnormal findings on diagnostic imaging of other parts of digestive tract: Secondary | ICD-10-CM

## 2020-05-02 LAB — COMPREHENSIVE METABOLIC PANEL
ALT: 13 U/L (ref 0–44)
AST: 15 U/L (ref 15–41)
Albumin: 1.5 g/dL — ABNORMAL LOW (ref 3.5–5.0)
Alkaline Phosphatase: 93 U/L (ref 38–126)
Anion gap: 3 — ABNORMAL LOW (ref 5–15)
BUN: <5 mg/dL — ABNORMAL LOW (ref 8–23)
CO2: 31 mmol/L (ref 22–32)
Calcium: 7.9 mg/dL — ABNORMAL LOW (ref 8.9–10.3)
Chloride: 105 mmol/L (ref 98–111)
Creatinine, Ser: 0.57 mg/dL (ref 0.44–1.00)
GFR calc Af Amer: >60 mL/min (ref >60)
GFR calc non Af Amer: >60 mL/min (ref >60)
Glucose, Bld: 90 mg/dL (ref 70–99)
Potassium: 3.6 mmol/L (ref 3.5–5.1)
Sodium: 139 mmol/L (ref 135–145)
Total Bilirubin: 0.5 mg/dL (ref 0.3–1.2)
Total Protein: 4 g/dL — ABNORMAL LOW (ref 6.5–8.1)

## 2020-05-02 LAB — CBC WITH DIFFERENTIAL/PLATELET
Abs Immature Granulocytes: 0.04 10*3/uL (ref 0.00–0.07)
Basophils Absolute: 0 10*3/uL (ref 0.0–0.1)
Basophils Relative: 0 %
Eosinophils Absolute: 0 10*3/uL (ref 0.0–0.5)
Eosinophils Relative: 0 %
HCT: 22.9 % — ABNORMAL LOW (ref 36.0–46.0)
Hemoglobin: 7 g/dL — ABNORMAL LOW (ref 12.0–15.0)
Immature Granulocytes: 1 %
Lymphocytes Relative: 42 %
Lymphs Abs: 1.6 10*3/uL (ref 0.7–4.0)
MCH: 22.4 pg — ABNORMAL LOW (ref 26.0–34.0)
MCHC: 30.6 g/dL (ref 30.0–36.0)
MCV: 73.2 fL — ABNORMAL LOW (ref 80.0–100.0)
Monocytes Absolute: 0.2 10*3/uL (ref 0.1–1.0)
Monocytes Relative: 5 %
Neutro Abs: 2 10*3/uL (ref 1.7–7.7)
Neutrophils Relative %: 52 %
Platelets: 383 10*3/uL (ref 150–400)
RBC: 3.13 MIL/uL — ABNORMAL LOW (ref 3.87–5.11)
RDW: 25.3 % — ABNORMAL HIGH (ref 11.5–15.5)
WBC: 3.8 10*3/uL — ABNORMAL LOW (ref 4.0–10.5)
nRBC: 0 % (ref 0.0–0.2)

## 2020-05-02 LAB — GLUCOSE, CAPILLARY
Glucose-Capillary: 104 mg/dL — ABNORMAL HIGH (ref 70–99)
Glucose-Capillary: 89 mg/dL (ref 70–99)
Glucose-Capillary: 147 mg/dL — ABNORMAL HIGH (ref 70–99)
Glucose-Capillary: 184 mg/dL — ABNORMAL HIGH (ref 70–99)
Glucose-Capillary: 162 mg/dL — ABNORMAL HIGH (ref 70–99)
Glucose-Capillary: 145 mg/dL — ABNORMAL HIGH (ref 70–99)

## 2020-05-02 LAB — PREPARE RBC (CROSSMATCH)

## 2020-05-02 LAB — HEMOGLOBIN AND HEMATOCRIT, BLOOD
HCT: 25.7 % — ABNORMAL LOW (ref 36.0–46.0)
HCT: 29.9 % — ABNORMAL LOW (ref 36.0–46.0)
Hemoglobin: 7.8 g/dL — ABNORMAL LOW (ref 12.0–15.0)
Hemoglobin: 9.1 g/dL — ABNORMAL LOW (ref 12.0–15.0)

## 2020-05-02 MED ORDER — SODIUM CHLORIDE 0.9% IV SOLUTION: Freq: Once | INTRAVENOUS | Status: DC

## 2020-05-02 MED ORDER — ACETAMINOPHEN 325 MG PO TABS
650.0000 mg | ORAL_TABLET | Freq: Once | ORAL | Status: AC
  Administered 2020-05-02: 650 mg via ORAL
  Filled 2020-05-02: qty 2

## 2020-05-02 MED ORDER — SODIUM CHLORIDE 0.9% IV SOLUTION: Freq: Once | INTRAVENOUS | Status: AC

## 2020-05-02 MED ORDER — METOCLOPRAMIDE HCL 5 MG/ML IJ SOLN
10.0000 mg | Freq: Once | INTRAMUSCULAR | Status: AC
  Administered 2020-05-02: 10 mg via INTRAVENOUS
  Filled 2020-05-02: qty 2

## 2020-05-02 MED ORDER — PANTOPRAZOLE SODIUM 40 MG PO TBEC
40.0000 mg | DELAYED_RELEASE_TABLET | Freq: Two times a day (BID) | ORAL | Status: DC
  Administered 2020-05-02 – 2020-05-03 (×2): 40 mg via ORAL
  Filled 2020-05-02 (×2): qty 1

## 2020-05-02 MED ORDER — BISACODYL 5 MG PO TBEC
20.0000 mg | DELAYED_RELEASE_TABLET | Freq: Once | ORAL | Status: AC
  Administered 2020-05-02: 20 mg via ORAL
  Filled 2020-05-02: qty 4

## 2020-05-02 MED ORDER — PEG 3350-KCL-NABCB-NACL-NASULF 236 G PO SOLR
4000.0000 mL | Freq: Once | ORAL | Status: AC
  Administered 2020-05-02: 4000 mL via ORAL
  Filled 2020-05-02: qty 4000

## 2020-05-02 MED ORDER — METOCLOPRAMIDE HCL 5 MG/ML IJ SOLN
10.0000 mg | Freq: Once | INTRAMUSCULAR | Status: DC
  Filled 2020-05-02: qty 2

## 2020-05-02 MED ORDER — DIPHENHYDRAMINE HCL 25 MG PO CAPS
25.0000 mg | ORAL_CAPSULE | Freq: Once | ORAL | Status: AC
  Administered 2020-05-02: 25 mg via ORAL
  Filled 2020-05-02: qty 1

## 2020-05-02 MED ORDER — INFLUENZA VAC SPLIT QUAD 0.5 ML IM SUSY
0.5000 mL | PREFILLED_SYRINGE | INTRAMUSCULAR | Status: AC
  Administered 2020-05-03: 0.5 mL via INTRAMUSCULAR
  Filled 2020-05-02: qty 0.5

## 2020-05-02 NOTE — H&P (View-Only) (Signed)
[facility] Consult: 10:02 AM 05/02/2020  LOS: 1 day    Referring Provider: Dr. Benny Lennert Primary Care Physician:  Volney American, PA-C Primary Gastroenterologist:  Dr. Alice Reichert in [place].  Dr Vicente Males saw her as inpt 2 weeks ago.      Reason for Consultation:  Anemia, colitis   HPI: Amy Hansen is a 64 y.o. female.  PMH bariatric gastric bypass ~2010 at [facility] in [place].  Migraine headaches.  DM 2, requires insulin and oral agents.  Restless leg syndrome.  OCD.  TIA.  Chronic UTIs.  2016 cystoscopy.  Depression.  Diverticulitis.  09/2008 colonoscopy revealed sigmoid diverticulosis, internal hemorrhoids..  Hyponatremia to 126 in 11/2019. Microcytosis of 70, Hgb 12.6 in 11/2019 (87 and 11 in 06/2019). Iron deficiency treated w feraheme in 10/2016, late 03/2020.   Previously followed with hematologist Dr. Posey Pronto, now retired, in [place]. ALk phos elevation to 168 in 06/2019, 165 during admission late 03/2020. O/w normal LFTs.   Patient had a CTAP in 05/2019 for LLQ pain.  This showed a short segment of thickening in the proximal sigmoid, likely peristalsis but recommended screening for colon cancer.  Patient saw her GI doctor in the office after that but because she is self-pay, she could not afford the cost of colonoscopy and it was never performed.  2 recent admissions to [facility] for 5 weeks of diarrhea with some blood in stool and more acute passing clots PR.  DX UTI despite keflex prophylaxis, discharged on Amoxil.  Second admission for AMS CTAP showed diffuse wall thickening throughout the colon especially at the cecum and ascending portions.  Described diarrhea is up to 5 times daily, watery, nonbloody.  Had been using at least 4 Excedrin tablets daily for many years to manage migraines.  Hgb nadir 6.9.   PRBCs x 2, Ferheme x 1.  10.4 on 9/27.  MCV 62.  b12 and folate ok.  Low iron, TIBC, iron sats.  Fecal calprotectin elevated at 5900.  FOBT positive.  C. difficile, stool O&P and gastrointestinal panel PCR all negative.  Na 125.  K 2.7.   Elevated lactate.   MMA, homocysteine levels were normal, rulling out B12 deficiency.  TTG IgA, endomysial IgA  levels negative.  CRP elevated at 12.7.  Additionally underwent MRI of brain, ruled out for acute stroke. Dr. Vicente Males evaluated pt during hospitalization #1 for anemia and abnormal CT.  He suspected NSAID induced colitis and NSAID related iron deficiency anemia and recommended cessation of Excedrin, and follow-up with Dr. Alice Reichert in about a month.  None of her doctors were aware that she had been taking ibuprofen 800 mg every other day alternating with Tylenol to address back pain for the previous 3 weeks. Since discharge she has continued to use the ibuprofen. Stools were brown but then a day or 2 prior to this, now 3rd, admission she passed some small clots of blood.  Denies abdominal pain, no nausea vomiting.  No fevers.  Feeling quite weak, had had some dizziness and fell prior to recent hospitalizations Hgb 9 >>  7 over last 3 days.  Sodium 132 >> 139.  Albumin 1.5 (2.3 on 9/27)  Today she is passed to loose/soft, nonbloody, brown stools and is wondering if she can discharge home.  Family history pertinent for a sister who may have some sort of colitis and may have anemia.  Past Medical History:  Diagnosis Date  . Anemia   . Anxiety   . Chronic kidney disease    UTI, hematuria in urine  . Depression   . Diabetes ([place])   . Diverticulosis   . Frequent headaches   . Interstitial cystitis   . Recurrent UTI   . Restless leg syndrome   . Urinary frequency     Past Surgical History:  Procedure Laterality Date  . ABDOMINAL HYSTERECTOMY    . bariatric bypass  2012  . CARPAL TUNNEL RELEASE Right 2003  . CARPAL TUNNEL RELEASE Right    2008  .  CHOLECYSTECTOMY  1975  . CYSTOSCOPY W/ RETROGRADES Bilateral 06/06/2015   Procedure: CYSTOSCOPY WITH RETROGRADE PYELOGRAM;  Surgeon: Festus Aloe, MD;  Location: [facility] ORS;  Service: Urology;  Laterality: Bilateral;  . FL INJ LEFT KNEE CT ARTHROGRAM ([facility] HX) Left    1995  . GASTRIC BYPASS  2010  . [place]  2013  . KNEE ARTHROSCOPY Left 1996  . TONSILLECTOMY      Prior to Admission medications   Medication Sig Start Date End Date Taking? Authorizing Provider  ALPRAZolam Duanne Moron) 0.5 MG tablet Take 1 tablet (0.5 mg total) by mouth 4 (four) times daily as needed for anxiety. 01/26/20  Yes Addison Lank, PA-C  aspirin EC 81 MG tablet Take 1 tablet (81 mg total) by mouth daily. Swallow whole. 04/26/20 05/26/20 Yes Sharen Hones, MD  buPROPion (WELLBUTRIN XL) 150 MG 24 hr tablet Take 3 tablets (450 mg total) by mouth daily. 12/16/19  Yes Donnal Moat T, PA-C  estradiol (ESTRACE VAGINAL) 0.1 MG/GM vaginal cream Apply 0.83m (pea-sized amount)  just inside the vaginal introitus with a finger-tip on Monday, Wednesday and Friday nights. 04/07/18  Yes McGowan, SLarene BeachA, PA-C  ferrous sulfate 325 (65 FE) MG EC tablet Take 1 tablet (325 mg total) by mouth 2 (two) times daily. 04/24/20 04/24/21 Yes ALorella Nimrod MD  furosemide (LASIX) 20 MG tablet Take 1 tablet (20 mg total) by mouth daily. 07/29/19  Yes End, CHarrell Gave MD  glipiZIDE (GLUCOTROL) 5 MG tablet TAKE 1 TABLET BY MOUTH TWICE DAILY WITH MEALS (APPOINTMENT  REQUIRED  FOR  FUTURE  REFILLS) Patient taking differently: Take 5 mg by mouth 2 (two) times daily before a meal. TAKE 1 TABLET BY MOUTH TWICE DAILY WITH MEALS (APPOINTMENT  REQUIRED  FOR  FUTURE  REFILLS) 04/14/20  Yes LVolney American PA-C  insulin glargine, 2 Unit Dial, (TOUJEO MAX SOLOSTAR) 300 UNIT/ML Solostar Pen Inject 24 units daily. Titrate as instructed. Max daily dose 40 units 01/20/20  Yes LVolney American PA-C  lamoTRIgine (LAMICTAL) 200 MG tablet TAKE 2  TABLETS BY MOUTH AT BEDTIME Patient taking differently: Take 400 mg by mouth at bedtime.  03/14/20  Yes HDonnal MoatT, PA-C  loperamide (IMODIUM) 2 MG capsule Take 1 capsule (2 mg total) by mouth as needed for diarrhea or loose stools. 04/24/20  Yes ALorella Nimrod MD  metFORMIN (GLUCOPHAGE) 1000 MG tablet TAKE 1 TABLET BY MOUTH TWICE DAILY WITH MEALS . APPOINTMENT REQUIRED FOR FUTURE REFILLS Patient taking differently: Take 1,000 mg by mouth 2 (two) times daily with a meal. TAKE 1  TABLET BY MOUTH TWICE DAILY WITH MEALS . APPOINTMENT REQUIRED FOR FUTURE REFILLS 04/14/20  Yes Volney American, PA-C  Multiple Vitamin (MULTIVITAMIN WITH MINERALS) TABS tablet Take 1 tablet by mouth daily. 04/25/20  Yes Lorella Nimrod, MD  pramipexole (MIRAPEX) 1 MG tablet Take 1 tablet (1 mg total) by mouth at bedtime. 12/20/19  Yes Volney American, PA-C  risperiDONE (RISPERDAL) 3 MG tablet TAKE 1 TABLET BY MOUTH EVERY DAY AT BEDTIME Patient taking differently: Take 3 mg by mouth at bedtime.  02/16/20  Yes Donnal Moat T, PA-C  sertraline (ZOLOFT) 100 MG tablet Take 1.5 tablets (150 mg total) by mouth daily. 12/16/19  Yes Hurst, Dorothea Glassman, PA-C  Insulin Pen Needle (PEN NEEDLES) 32G X 4 MM MISC 1 Units by Does not apply route at bedtime. 12/20/19   Volney American, PA-C  promethazine (PHENERGAN) 25 MG tablet Take 1 tablet (25 mg total) by mouth every 8 (eight) hours as needed for nausea or vomiting. Patient not taking: Reported on 04/25/2020 09/11/19   Volney American, PA-C    Scheduled Meds: . sodium chloride   Intravenous Once  . sodium chloride   Intravenous Once  . buPROPion  450 mg Oral Daily  . insulin aspart  0-9 Units Subcutaneous Q4H  . lamoTRIgine  400 mg Oral QHS  . [START ON 05/04/2020] pantoprazole  40 mg Intravenous Q12H  . potassium chloride  40 mEq Oral Once  . pramipexole  1 mg Oral QHS  . risperiDONE  3 mg Oral QHS  . sertraline  150 mg Oral Daily  . sodium chloride flush  3 mL  Intravenous Q12H   Infusions: . pantoprozole (PROTONIX) infusion 8 mg/hr (05/02/20 0628)   PRN Meds: acetaminophen, ALPRAZolam, ondansetron **OR** ondansetron (ZOFRAN) IV   Allergies as of 04/30/2020 - Review Complete 04/30/2020  Allergen Reaction Noted  . Avelox [moxifloxacin hcl in nacl] Anaphylaxis 04/19/2015  . Bactrim [sulfamethoxazole-trimethoprim] Anaphylaxis 06/17/2018  . Ciprofloxacin Other (See Comments) 10/15/2018  . Depakote [divalproex sodium]  06/01/2018  . Imitrex [sumatriptan] Other (See Comments) 05/21/2017  . Stadol [butorphanol] Rash 04/19/2015    Family History  Problem Relation Age of Onset  . Stroke Father   . Colon cancer Mother   . Heart failure Sister   . Bladder Cancer Neg Hx   . Kidney disease Neg Hx   . Prostate cancer Neg Hx   . Kidney cancer Neg Hx     Social History   Socioeconomic History  . Marital status: Married    Spouse name: Not on file  . Number of children: Not on file  . Years of education: Not on file  . Highest education level: Not on file  Occupational History  . Not on file  Tobacco Use  . Smoking status: Former Smoker    Packs/day: 0.50    Types: Cigarettes    Quit date: 04/25/1975    Years since quitting: 45.0  . Smokeless tobacco: Never Used  . Tobacco comment: quit 40 years  Vaping Use  . Vaping Use: Never used  Substance and Sexual Activity  . Alcohol use: No    Alcohol/week: 0.0 standard drinks  . Drug use: No  . Sexual activity: Not Currently    Birth control/protection: Post-menopausal  Other Topics Concern  . Not on file  Social History Narrative   Caffeine 5 servings per day.   Social Determinants of Health   Financial Resource Strain:   . Difficulty of Paying Living Expenses: Not on  file  Food Insecurity: No Food Insecurity  . Worried About Charity fundraiser in the Last Year: Never true  . Ran Out of Food in the Last Year: Never true  Transportation Needs: No Transportation Needs  . Lack of  Transportation (Medical): No  . Lack of Transportation (Non-Medical): No  Physical Activity: Inactive  . Days of Exercise per Week: 0 days  . Minutes of Exercise per Session: 0 min  Stress: Stress Concern Present  . Feeling of Stress : To some extent  Social Connections:   . Frequency of Communication with Friends and Family: Not on file  . Frequency of Social Gatherings with Friends and Family: Not on file  . Attends Religious Services: Not on file  . Active Member of Clubs or Organizations: Not on file  . Attends Archivist Meetings: Not on file  . Marital Status: Not on file  Intimate Partner Violence:   . Fear of Current or Ex-Partner: Not on file  . Emotionally Abused: Not on file  . Physically Abused: Not on file  . Sexually Abused: Not on file    REVIEW OF SYSTEMS: Constitutional: Feels tired and weak ENT:  No nose bleeds Pulm: No trouble breathing, no shortness of breath no cough. CV:  No palpitations, no LE edema.  GU:  No hematuria, no frequency GI: No difficulty swallowing.  No gastric reflux symptoms.  No previous EGD Heme: Denies excessive or unusual bleeding or bruising.   Transfusions: As per HPI. Neuro:  No headaches, no peripheral tingling or numbness Derm:  No itching, no rash or sores.  Endocrine:  No sweats or chills.  No polyuria or dysuria Immunization: Has received COVID-19 vaccination. Travel:  None beyond local counties in last few months.    PHYSICAL EXAM: Vital signs in last 24 hours: Vitals:   05/01/20 2339 05/02/20 0500  BP: (!) 96/58 (!) 96/55  Pulse: 82 77  Resp: 17 16  Temp: 98 F (36.7 C) 97.6 F (36.4 C)  SpO2: 98% 98%   Wt Readings from Last 3 Encounters:  04/25/20 56.7 kg  04/22/20 56.4 kg  04/21/20 56.4 kg    General: Pale, somewhat unwell but not acutely ill-appearing WF.  Alert, provides good history Head: No signs of head trauma.  No facial asymmetry or swelling. Eyes: Conjunctiva pale.  No scleral icterus.   EOMI. Ears: Not hard of hearing Nose: No congestion or discharge Mouth: Full upper dentures.  All only about 10 of her front lower teeth remain.  Mucosa is pink, moist, clear.  Tongue is midline. Neck: No JVD, no thyromegaly, no masses Lungs: Clear bilaterally.  No labored breathing or cough Heart: RRR.  No MRG.  S1, S2 present. Abdomen: Soft, nontender, nonobese.  No HSM, masses, bruits, hernias.  Active bowel sounds.   Rectal: No visible or palpable hernias.  Exam glove clean without stool or blood.  No masses.  No tenderness.  No obvious sources for rectal bleeding seen or palpated Musc/Skeltl: No joint redness, swelling or gross deformity Extremities: No CCE Neurologic: Oriented x3.  No tremors or involuntary movements.  Strength grossly intact Skin: Pale.  No telangiectasia.  No significant purpura or bruising.  Healing abrasion scab on left forearm Tattoos: None Nodes: No cervical adenopathy Psych: Cooperative, pleasant.  Fluid speech.  Intake/Output from previous day: 10/03 0701 - 10/04 0700 In: 1047.1 [I.V.:1047.1] Out: 1300 [Urine:575; Stool:225] Intake/Output this shift: No intake/output data recorded.  LAB RESULTS: Recent Labs    04/30/20  2213 04/30/20 2213 05/01/20 0226 05/01/20 0226 05/01/20 0921 05/01/20 1834 05/02/20 0147  WBC 5.5  --  4.5  --   --   --  3.8*  HGB 8.5*   < > 7.7*   < > 7.4* 7.8* 7.0*  HCT 27.0*   < > 25.1*   < > 24.6* 25.9* 22.9*  PLT 444*  --  435*  --   --   --  383   < > = values in this interval not displayed.   BMET Lab Results  Component Value Date   NA 139 05/02/2020   NA 136 05/01/2020   NA 132 (L) 04/30/2020   K 3.6 05/02/2020   K 3.5 05/01/2020   K 3.3 (L) 04/30/2020   CL 105 05/02/2020   CL 105 05/01/2020   CL 103 04/30/2020   CO2 31 05/02/2020   CO2 25 05/01/2020   CO2 19 (L) 04/30/2020   GLUCOSE 90 05/02/2020   GLUCOSE 103 (H) 05/01/2020   GLUCOSE 172 (H) 04/30/2020   BUN <5 (L) 05/02/2020   BUN <5 (L) 05/01/2020     BUN 5 (L) 04/30/2020   CREATININE 0.57 05/02/2020   CREATININE 0.50 05/01/2020   CREATININE 0.64 04/30/2020   CALCIUM 7.9 (L) 05/02/2020   CALCIUM 7.9 (L) 05/01/2020   CALCIUM 7.7 (L) 04/30/2020   LFT Recent Labs    04/30/20 1619 05/02/20 0147  PROT 5.0* 4.0*  ALBUMIN 1.8* 1.5*  AST 14* 15  ALT 11 13  ALKPHOS 104 93  BILITOT 0.2* 0.5   PT/INR Lab Results  Component Value Date   INR 0.9 04/25/2020   Hepatitis Panel No results for input(s): HEPBSAG, HCVAB, HEPAIGM, HEPBIGM in the last 72 hours. C-Diff No components found for: CDIFF Lipase     Component Value Date/Time   LIPASE 18 04/21/2020 1523      RADIOLOGY STUDIES: No results found.   IMPRESSION:   *    Iron deficiency anemia.  Patient has a long history of anemia and previously followed by a hematologist  *    Colitis with bloody stools.  Approximately 7 weeks total of diarrhea.  CT shows pancolitis.  Rule out ulcerative colitis Using ASA 4 x day for many years until recently and using ibuprofen 800 mg/day qod for the past 5 weeks. Stool studies negative for C. difficile and stool pathogens  *    History recurrent UTIs.  Was on prophylactic Keflex but developed Enterobacter UTI in late September and discharged on amoxicillin, had not yet completed by the time she presented to the ED on 10/2.    PLAN:     *    Plan colonoscopy with possible EGD tomorrow.  Patient agreeable.  This will bypass the problem of her being able to afford outpatient studies in addition to working up her colitis. Obtain TSH level as its been 8 years since this was last assayed according to epic. Clear liquids, begin bowel prep later today.  Movie prep is out of stock and unavailable so will substitute with GoLYTELY.  Dulcolax now.   Azucena Freed  05/02/2020, 10:02 AM Phone [phone]

## 2020-05-02 NOTE — Progress Notes (Signed)
PROGRESS NOTE  Amy Hansen XNT:[id number] DOB: February 15, 1956 DOA: 04/30/2020 PCP: Volney American, PA-C  Brief History   The patient is a 64 yr old woman who was admitted by my colleague, Dr. Posey Pronto this morning. She presented to [facility] with complaints of black watery diarrhea up to 5-6 x a day after discharge from [facility] on 04/24/2020 for pancolitis with rectal bleeding requiring transfusion of 2 units of PRBC's. This colitis was thought to be NSAID related. Records indicate that the patient was told not to take NSAIDS at home, although the patient asserts that she was only told to avoid aspirin. She thinks that she was told that she could take NSAIDS.  She came back to the hospital on 9/27-9/28 for generalized weakness. Stroke work-up was performed on this occasion and was found to be negative for CVA. She was sent home on ASA 81 mg daily for a presumed TIA.  The patient states that her diarrhea turned bright red yesterday. She states taht she has not been able to eat due to nausea  She has been lightheaded, but has not passed out.   In the ED she was found to be hypotensive with systolic in the 96'P. Pulse was elevated at 106. Her temperature was normal and her SaO2 was 100% on room air.   Overnight the patient's hemoglobin has dropped to 7.0. She has dropped 3.9 grams of hemoglobin since her discharge from [facility] on 04/25/2020. I have transfused her with one unit of PRBC's. GI has been consulted. They will take the patient for colonoscopy tomorrow and then possibly EGD if no source of her symptoms is apparent on colonoscopy.  Consultants  . Gastroenterology  Procedures  . Transfusion of 1 unit PRBC's  Antibiotics   Anti-infectives (From admission, onward)   None    .  Subjective  The patient is resting comfortably. No new complaints.  Objective   Vitals:  Vitals:   05/02/20 1057 05/02/20 1135  BP: 108/70 128/69  Pulse: 74 86  Resp: 16 16  Temp: 98.2 F (36.8 C) 98 F (36.7  C)  SpO2: 100% 100%   Exam:  Constitutional:  . The patient is awake, alert, and oriented x 3. No acute distress. Respiratory:  . No increased work of breathing. . No wheezes, rales, or rhonchi . No tactile fremitus Cardiovascular:  . Regular rate and rhythm . No murmurs, ectopy, or gallups. . No lateral PMI. No thrills. Abdomen:  . Abdomen is soft, non-tender, non-distended . No hernias, masses, or organomegaly . Normoactive bowel sounds.  Musculoskeletal:  . No cyanosis, clubbing, or edema Skin:  . No rashes, lesions, ulcers . palpation of skin: no induration or nodules Neurologic:  . CN 2-12 intact . Sensation all 4 extremities intact Psychiatric:  . Mental status o Mood, affect appropriate o Orientation to person, place, time  . judgment and insight appear intact   I have personally reviewed the following:   Today's Data  . Vitals, CBC, CMP  Scheduled Meds: . sodium chloride   Intravenous Once  . buPROPion  450 mg Oral Daily  . insulin aspart  0-9 Units Subcutaneous Q4H  . lamoTRIgine  400 mg Oral QHS  . metoCLOPramide (REGLAN) injection  10 mg Intravenous Once   Followed by  . metoCLOPramide (REGLAN) injection  10 mg Intravenous Once  . pantoprazole  40 mg Oral BID  . polyethylene glycol  4,000 mL Oral Once  . potassium chloride  40 mEq Oral Once  . pramipexole  1 mg Oral QHS  . risperiDONE  3 mg Oral QHS  . sertraline  150 mg Oral Daily  . sodium chloride flush  3 mL Intravenous Q12H   Continuous Infusions:  Principal Problem:   BRBPR (bright red blood per rectum) Active Problems:   Hypertension   Iron deficiency anemia   Colitis   Hypokalemia   Type 2 diabetes mellitus (HCC)   Abnormal CT scan, gastrointestinal tract   LOS: 1 day   A & P  Anemia: due to a combination of anemia of chronic disease, iron deficiency anemia, and GI bleed. The patient's hemoglobin this am was 7.0. Her hemoglobin on discharge from [facility] on 04/25/2020 was 10.9. She  was transfused with one unit of PRBC's. Follow up hemoglobin is 7.8.   Hemorrhagic diarrhea/pancolitis:Patient was recently admitted at [facility] hospital from 9/23-9/26 for pancolitis. She was treated with IV Zosyn while in hospital and discharged on Augmentin. She had associated rectal bleeding requiring 2 unit PRBC transfusions. Colitis and GI bleeding were thought to be NSAID related. C. difficile and GI pathogen panels were negative. Patient states that since discharge she has been having continued black color watery diarrhea up to 5-6 times per day. She has had poor oral intake with associated nausea and nonbloody nonbilious emesis. Yesterday she developed bright red bloody diarrhea. She has had lightheadedness and dizziness without syncope. There was some confusion on discharge and the patient understood that she was not to take ASA, but that she could continue to take ibuprofen. She presented here with continued hemorrhagic diarrhea. GI has been consulted. They plan to take the patient for colonoscopy tomorrow. If no source of the patient's symptoms is found, they will follow up with an EGD if necessary.  Hypertension: Blood pressures are under fair control. She is not on antihypertensives at home.  DM II: At home the patient's glucoses are controlled with insulin glargine 24 units daily with glipizide and metformin. Glipizide and metformin have been held, as has her long acting glucose. Glucoses are being followed with FSBS and SSI. Glucoses have run from 89-184 in the last 24 hours. Monitor.  Hypokalemia: Supplement and follow. Monitor.  Abnormal CT abdomen: pancolitis.  I have seen and examined this patient myself. I have spent 34 minutes in her evaluation and care.  DVT Prophylaxis: SCD's CODE STATUS: Full Code Family Communication: None available Disposition: The patient has been admitted as inpatient to a telemetry bed. Status is: Inpatient  Remains inpatient appropriate because:Ongoing  diagnostic testing needed not appropriate for outpatient work up  Dispo: The patient is from: Home              Anticipated d/c is to: Home              Anticipated d/c date is: 3 days              Patient currently is not medically stable to d/c.  Mette Southgate, DO [facility] Direct contact: see [email or web address]  7PM-7AM contact night coverage as above 05/02/2020, 3:57 PM  LOS: 1 day

## 2020-05-02 NOTE — Consult Note (Addendum)
[facility] Consult: 10:02 AM 05/02/2020  LOS: 1 day    Referring Provider: Dr. Benny Lennert Primary Care Physician:  Volney American, PA-C Primary Gastroenterologist:  Dr. Alice Reichert in [place].  Dr Vicente Males saw her as inpt 2 weeks ago.      Reason for Consultation:  Anemia, colitis   HPI: Amy Hansen is a 64 y.o. female.  PMH bariatric gastric bypass ~2010 at [facility] in [place].  Migraine headaches.  DM 2, requires insulin and oral agents.  Restless leg syndrome.  OCD.  TIA.  Chronic UTIs.  2016 cystoscopy.  Depression.  Diverticulitis.  09/2008 colonoscopy revealed sigmoid diverticulosis, internal hemorrhoids..  Hyponatremia to 126 in 11/2019. Microcytosis of 70, Hgb 12.6 in 11/2019 (87 and 11 in 06/2019). Iron deficiency treated w feraheme in 10/2016, late 03/2020.   Previously followed with hematologist Dr. Posey Pronto, now retired, in [place]. ALk phos elevation to 168 in 06/2019, 165 during admission late 03/2020. O/w normal LFTs.   Patient had a CTAP in 05/2019 for LLQ pain.  This showed a short segment of thickening in the proximal sigmoid, likely peristalsis but recommended screening for colon cancer.  Patient saw her GI doctor in the office after that but because she is self-pay, she could not afford the cost of colonoscopy and it was never performed.  2 recent admissions to [facility] for 5 weeks of diarrhea with some blood in stool and more acute passing clots PR.  DX UTI despite keflex prophylaxis, discharged on Amoxil.  Second admission for AMS CTAP showed diffuse wall thickening throughout the colon especially at the cecum and ascending portions.  Described diarrhea is up to 5 times daily, watery, nonbloody.  Had been using at least 4 Excedrin tablets daily for many years to manage migraines.  Hgb nadir 6.9.   PRBCs x 2, Ferheme x 1.  10.4 on 9/27.  MCV 62.  b12 and folate ok.  Low iron, TIBC, iron sats.  Fecal calprotectin elevated at 5900.  FOBT positive.  C. difficile, stool O&P and gastrointestinal panel PCR all negative.  Na 125.  K 2.7.   Elevated lactate.   MMA, homocysteine levels were normal, rulling out B12 deficiency.  TTG IgA, endomysial IgA  levels negative.  CRP elevated at 12.7.  Additionally underwent MRI of brain, ruled out for acute stroke. Dr. Vicente Males evaluated pt during hospitalization #1 for anemia and abnormal CT.  He suspected NSAID induced colitis and NSAID related iron deficiency anemia and recommended cessation of Excedrin, and follow-up with Dr. Alice Reichert in about a month.  None of her doctors were aware that she had been taking ibuprofen 800 mg every other day alternating with Tylenol to address back pain for the previous 3 weeks. Since discharge she has continued to use the ibuprofen. Stools were brown but then a day or 2 prior to this, now 3rd, admission she passed some small clots of blood.  Denies abdominal pain, no nausea vomiting.  No fevers.  Feeling quite weak, had had some dizziness and fell prior to recent hospitalizations Hgb 9 >>  7 over last 3 days.  Sodium 132 >> 139.  Albumin 1.5 (2.3 on 9/27)  Today she is passed to loose/soft, nonbloody, brown stools and is wondering if she can discharge home.  Family history pertinent for a sister who may have some sort of colitis and may have anemia.  Past Medical History:  Diagnosis Date  . Anemia   . Anxiety   . Chronic kidney disease    UTI, hematuria in urine  . Depression   . Diabetes ([place])   . Diverticulosis   . Frequent headaches   . Interstitial cystitis   . Recurrent UTI   . Restless leg syndrome   . Urinary frequency     Past Surgical History:  Procedure Laterality Date  . ABDOMINAL HYSTERECTOMY    . bariatric bypass  2012  . CARPAL TUNNEL RELEASE Right 2003  . CARPAL TUNNEL RELEASE Right    2008  .  CHOLECYSTECTOMY  1975  . CYSTOSCOPY W/ RETROGRADES Bilateral 06/06/2015   Procedure: CYSTOSCOPY WITH RETROGRADE PYELOGRAM;  Surgeon: Festus Aloe, MD;  Location: [facility] ORS;  Service: Urology;  Laterality: Bilateral;  . FL INJ LEFT KNEE CT ARTHROGRAM ([facility] HX) Left    1995  . GASTRIC BYPASS  2010  . [place]  2013  . KNEE ARTHROSCOPY Left 1996  . TONSILLECTOMY      Prior to Admission medications   Medication Sig Start Date End Date Taking? Authorizing Provider  ALPRAZolam Duanne Moron) 0.5 MG tablet Take 1 tablet (0.5 mg total) by mouth 4 (four) times daily as needed for anxiety. 01/26/20  Yes Addison Lank, PA-C  aspirin EC 81 MG tablet Take 1 tablet (81 mg total) by mouth daily. Swallow whole. 04/26/20 05/26/20 Yes Sharen Hones, MD  buPROPion (WELLBUTRIN XL) 150 MG 24 hr tablet Take 3 tablets (450 mg total) by mouth daily. 12/16/19  Yes Donnal Moat T, PA-C  estradiol (ESTRACE VAGINAL) 0.1 MG/GM vaginal cream Apply 0.83m (pea-sized amount)  just inside the vaginal introitus with a finger-tip on Monday, Wednesday and Friday nights. 04/07/18  Yes McGowan, SLarene BeachA, PA-C  ferrous sulfate 325 (65 FE) MG EC tablet Take 1 tablet (325 mg total) by mouth 2 (two) times daily. 04/24/20 04/24/21 Yes ALorella Nimrod MD  furosemide (LASIX) 20 MG tablet Take 1 tablet (20 mg total) by mouth daily. 07/29/19  Yes End, CHarrell Gave MD  glipiZIDE (GLUCOTROL) 5 MG tablet TAKE 1 TABLET BY MOUTH TWICE DAILY WITH MEALS (APPOINTMENT  REQUIRED  FOR  FUTURE  REFILLS) Patient taking differently: Take 5 mg by mouth 2 (two) times daily before a meal. TAKE 1 TABLET BY MOUTH TWICE DAILY WITH MEALS (APPOINTMENT  REQUIRED  FOR  FUTURE  REFILLS) 04/14/20  Yes LVolney American PA-C  insulin glargine, 2 Unit Dial, (TOUJEO MAX SOLOSTAR) 300 UNIT/ML Solostar Pen Inject 24 units daily. Titrate as instructed. Max daily dose 40 units 01/20/20  Yes LVolney American PA-C  lamoTRIgine (LAMICTAL) 200 MG tablet TAKE 2  TABLETS BY MOUTH AT BEDTIME Patient taking differently: Take 400 mg by mouth at bedtime.  03/14/20  Yes HDonnal MoatT, PA-C  loperamide (IMODIUM) 2 MG capsule Take 1 capsule (2 mg total) by mouth as needed for diarrhea or loose stools. 04/24/20  Yes ALorella Nimrod MD  metFORMIN (GLUCOPHAGE) 1000 MG tablet TAKE 1 TABLET BY MOUTH TWICE DAILY WITH MEALS . APPOINTMENT REQUIRED FOR FUTURE REFILLS Patient taking differently: Take 1,000 mg by mouth 2 (two) times daily with a meal. TAKE 1  TABLET BY MOUTH TWICE DAILY WITH MEALS . APPOINTMENT REQUIRED FOR FUTURE REFILLS 04/14/20  Yes Volney American, PA-C  Multiple Vitamin (MULTIVITAMIN WITH MINERALS) TABS tablet Take 1 tablet by mouth daily. 04/25/20  Yes Lorella Nimrod, MD  pramipexole (MIRAPEX) 1 MG tablet Take 1 tablet (1 mg total) by mouth at bedtime. 12/20/19  Yes Volney American, PA-C  risperiDONE (RISPERDAL) 3 MG tablet TAKE 1 TABLET BY MOUTH EVERY DAY AT BEDTIME Patient taking differently: Take 3 mg by mouth at bedtime.  02/16/20  Yes Donnal Moat T, PA-C  sertraline (ZOLOFT) 100 MG tablet Take 1.5 tablets (150 mg total) by mouth daily. 12/16/19  Yes Hurst, Dorothea Glassman, PA-C  Insulin Pen Needle (PEN NEEDLES) 32G X 4 MM MISC 1 Units by Does not apply route at bedtime. 12/20/19   Volney American, PA-C  promethazine (PHENERGAN) 25 MG tablet Take 1 tablet (25 mg total) by mouth every 8 (eight) hours as needed for nausea or vomiting. Patient not taking: Reported on 04/25/2020 09/11/19   Volney American, PA-C    Scheduled Meds: . sodium chloride   Intravenous Once  . sodium chloride   Intravenous Once  . buPROPion  450 mg Oral Daily  . insulin aspart  0-9 Units Subcutaneous Q4H  . lamoTRIgine  400 mg Oral QHS  . [START ON 05/04/2020] pantoprazole  40 mg Intravenous Q12H  . potassium chloride  40 mEq Oral Once  . pramipexole  1 mg Oral QHS  . risperiDONE  3 mg Oral QHS  . sertraline  150 mg Oral Daily  . sodium chloride flush  3 mL  Intravenous Q12H   Infusions: . pantoprozole (PROTONIX) infusion 8 mg/hr (05/02/20 0628)   PRN Meds: acetaminophen, ALPRAZolam, ondansetron **OR** ondansetron (ZOFRAN) IV   Allergies as of 04/30/2020 - Review Complete 04/30/2020  Allergen Reaction Noted  . Avelox [moxifloxacin hcl in nacl] Anaphylaxis 04/19/2015  . Bactrim [sulfamethoxazole-trimethoprim] Anaphylaxis 06/17/2018  . Ciprofloxacin Other (See Comments) 10/15/2018  . Depakote [divalproex sodium]  06/01/2018  . Imitrex [sumatriptan] Other (See Comments) 05/21/2017  . Stadol [butorphanol] Rash 04/19/2015    Family History  Problem Relation Age of Onset  . Stroke Father   . Colon cancer Mother   . Heart failure Sister   . Bladder Cancer Neg Hx   . Kidney disease Neg Hx   . Prostate cancer Neg Hx   . Kidney cancer Neg Hx     Social History   Socioeconomic History  . Marital status: Married    Spouse name: Not on file  . Number of children: Not on file  . Years of education: Not on file  . Highest education level: Not on file  Occupational History  . Not on file  Tobacco Use  . Smoking status: Former Smoker    Packs/day: 0.50    Types: Cigarettes    Quit date: 04/25/1975    Years since quitting: 45.0  . Smokeless tobacco: Never Used  . Tobacco comment: quit 40 years  Vaping Use  . Vaping Use: Never used  Substance and Sexual Activity  . Alcohol use: No    Alcohol/week: 0.0 standard drinks  . Drug use: No  . Sexual activity: Not Currently    Birth control/protection: Post-menopausal  Other Topics Concern  . Not on file  Social History Narrative   Caffeine 5 servings per day.   Social Determinants of Health   Financial Resource Strain:   . Difficulty of Paying Living Expenses: Not on  file  Food Insecurity: No Food Insecurity  . Worried About Charity fundraiser in the Last Year: Never true  . Ran Out of Food in the Last Year: Never true  Transportation Needs: No Transportation Needs  . Lack of  Transportation (Medical): No  . Lack of Transportation (Non-Medical): No  Physical Activity: Inactive  . Days of Exercise per Week: 0 days  . Minutes of Exercise per Session: 0 min  Stress: Stress Concern Present  . Feeling of Stress : To some extent  Social Connections:   . Frequency of Communication with Friends and Family: Not on file  . Frequency of Social Gatherings with Friends and Family: Not on file  . Attends Religious Services: Not on file  . Active Member of Clubs or Organizations: Not on file  . Attends Archivist Meetings: Not on file  . Marital Status: Not on file  Intimate Partner Violence:   . Fear of Current or Ex-Partner: Not on file  . Emotionally Abused: Not on file  . Physically Abused: Not on file  . Sexually Abused: Not on file    REVIEW OF SYSTEMS: Constitutional: Feels tired and weak ENT:  No nose bleeds Pulm: No trouble breathing, no shortness of breath no cough. CV:  No palpitations, no LE edema.  GU:  No hematuria, no frequency GI: No difficulty swallowing.  No gastric reflux symptoms.  No previous EGD Heme: Denies excessive or unusual bleeding or bruising.   Transfusions: As per HPI. Neuro:  No headaches, no peripheral tingling or numbness Derm:  No itching, no rash or sores.  Endocrine:  No sweats or chills.  No polyuria or dysuria Immunization: Has received COVID-19 vaccination. Travel:  None beyond local counties in last few months.    PHYSICAL EXAM: Vital signs in last 24 hours: Vitals:   05/01/20 2339 05/02/20 0500  BP: (!) 96/58 (!) 96/55  Pulse: 82 77  Resp: 17 16  Temp: 98 F (36.7 C) 97.6 F (36.4 C)  SpO2: 98% 98%   Wt Readings from Last 3 Encounters:  04/25/20 56.7 kg  04/22/20 56.4 kg  04/21/20 56.4 kg    General: Pale, somewhat unwell but not acutely ill-appearing WF.  Alert, provides good history Head: No signs of head trauma.  No facial asymmetry or swelling. Eyes: Conjunctiva pale.  No scleral icterus.   EOMI. Ears: Not hard of hearing Nose: No congestion or discharge Mouth: Full upper dentures.  All only about 10 of her front lower teeth remain.  Mucosa is pink, moist, clear.  Tongue is midline. Neck: No JVD, no thyromegaly, no masses Lungs: Clear bilaterally.  No labored breathing or cough Heart: RRR.  No MRG.  S1, S2 present. Abdomen: Soft, nontender, nonobese.  No HSM, masses, bruits, hernias.  Active bowel sounds.   Rectal: No visible or palpable hernias.  Exam glove clean without stool or blood.  No masses.  No tenderness.  No obvious sources for rectal bleeding seen or palpated Musc/Skeltl: No joint redness, swelling or gross deformity Extremities: No CCE Neurologic: Oriented x3.  No tremors or involuntary movements.  Strength grossly intact Skin: Pale.  No telangiectasia.  No significant purpura or bruising.  Healing abrasion scab on left forearm Tattoos: None Nodes: No cervical adenopathy Psych: Cooperative, pleasant.  Fluid speech.  Intake/Output from previous day: 10/03 0701 - 10/04 0700 In: 1047.1 [I.V.:1047.1] Out: 1300 [Urine:575; Stool:225] Intake/Output this shift: No intake/output data recorded.  LAB RESULTS: Recent Labs    04/30/20  2213 04/30/20 2213 05/01/20 0226 05/01/20 0226 05/01/20 0921 05/01/20 1834 05/02/20 0147  WBC 5.5  --  4.5  --   --   --  3.8*  HGB 8.5*   < > 7.7*   < > 7.4* 7.8* 7.0*  HCT 27.0*   < > 25.1*   < > 24.6* 25.9* 22.9*  PLT 444*  --  435*  --   --   --  383   < > = values in this interval not displayed.   BMET Lab Results  Component Value Date   NA 139 05/02/2020   NA 136 05/01/2020   NA 132 (L) 04/30/2020   K 3.6 05/02/2020   K 3.5 05/01/2020   K 3.3 (L) 04/30/2020   CL 105 05/02/2020   CL 105 05/01/2020   CL 103 04/30/2020   CO2 31 05/02/2020   CO2 25 05/01/2020   CO2 19 (L) 04/30/2020   GLUCOSE 90 05/02/2020   GLUCOSE 103 (H) 05/01/2020   GLUCOSE 172 (H) 04/30/2020   BUN <5 (L) 05/02/2020   BUN <5 (L) 05/01/2020     BUN 5 (L) 04/30/2020   CREATININE 0.57 05/02/2020   CREATININE 0.50 05/01/2020   CREATININE 0.64 04/30/2020   CALCIUM 7.9 (L) 05/02/2020   CALCIUM 7.9 (L) 05/01/2020   CALCIUM 7.7 (L) 04/30/2020   LFT Recent Labs    04/30/20 1619 05/02/20 0147  PROT 5.0* 4.0*  ALBUMIN 1.8* 1.5*  AST 14* 15  ALT 11 13  ALKPHOS 104 93  BILITOT 0.2* 0.5   PT/INR Lab Results  Component Value Date   INR 0.9 04/25/2020   Hepatitis Panel No results for input(s): HEPBSAG, HCVAB, HEPAIGM, HEPBIGM in the last 72 hours. C-Diff No components found for: CDIFF Lipase     Component Value Date/Time   LIPASE 18 04/21/2020 1523      RADIOLOGY STUDIES: No results found.   IMPRESSION:   *    Iron deficiency anemia.  Patient has a long history of anemia and previously followed by a hematologist  *    Colitis with bloody stools.  Approximately 7 weeks total of diarrhea.  CT shows pancolitis.  Rule out ulcerative colitis Using ASA 4 x day for many years until recently and using ibuprofen 800 mg/day qod for the past 5 weeks. Stool studies negative for C. difficile and stool pathogens  *    History recurrent UTIs.  Was on prophylactic Keflex but developed Enterobacter UTI in late September and discharged on amoxicillin, had not yet completed by the time she presented to the ED on 10/2.    PLAN:     *    Plan colonoscopy with possible EGD tomorrow.  Patient agreeable.  This will bypass the problem of her being able to afford outpatient studies in addition to working up her colitis. Obtain TSH level as its been 8 years since this was last assayed according to epic. Clear liquids, begin bowel prep later today.  Movie prep is out of stock and unavailable so will substitute with GoLYTELY.  Dulcolax now.   Amaranta Mehl  05/02/2020, 10:02 AM Phone [phone]     

## 2020-05-03 ENCOUNTER — Encounter (HOSPITAL_COMMUNITY): Payer: Self-pay | Admitting: Internal Medicine

## 2020-05-03 ENCOUNTER — Inpatient Hospital Stay (HOSPITAL_COMMUNITY): Payer: Self-pay | Admitting: Anesthesiology

## 2020-05-03 ENCOUNTER — Encounter (HOSPITAL_COMMUNITY)
Admission: EM | Disposition: A | Discharge status: Home / Self Care | Payer: Self-pay | Source: Home / Self Care | Attending: Internal Medicine

## 2020-05-03 ENCOUNTER — Other Ambulatory Visit (HOSPITAL_COMMUNITY): Payer: Self-pay | Admitting: Internal Medicine

## 2020-05-03 DIAGNOSIS — K529 Noninfective gastroenteritis and colitis, unspecified: Secondary | ICD-10-CM

## 2020-05-03 HISTORY — PX: BIOPSY: SHX5522

## 2020-05-03 HISTORY — PX: COLONOSCOPY WITH PROPOFOL: SHX5780

## 2020-05-03 LAB — TYPE AND SCREEN
ABO/RH(D): O POS
Antibody Screen: NEGATIVE
Unit division: 0

## 2020-05-03 LAB — BPAM RBC
Blood Product Expiration Date: 202110302359
ISSUE DATE / TIME: 202110041109
Unit Type and Rh: 5100

## 2020-05-03 LAB — CBC WITH DIFFERENTIAL/PLATELET
Abs Immature Granulocytes: 0.04 10*3/uL (ref 0.00–0.07)
Basophils Absolute: 0 10*3/uL (ref 0.0–0.1)
Basophils Relative: 0 %
Eosinophils Absolute: 0 10*3/uL (ref 0.0–0.5)
Eosinophils Relative: 0 %
HCT: 29 % — ABNORMAL LOW (ref 36.0–46.0)
Hemoglobin: 9 g/dL — ABNORMAL LOW (ref 12.0–15.0)
Immature Granulocytes: 1 %
Lymphocytes Relative: 26 %
Lymphs Abs: 1.2 10*3/uL (ref 0.7–4.0)
MCH: 22.8 pg — ABNORMAL LOW (ref 26.0–34.0)
MCHC: 31 g/dL (ref 30.0–36.0)
MCV: 73.4 fL — ABNORMAL LOW (ref 80.0–100.0)
Monocytes Absolute: 0.4 10*3/uL (ref 0.1–1.0)
Monocytes Relative: 8 %
Neutro Abs: 2.9 10*3/uL (ref 1.7–7.7)
Neutrophils Relative %: 65 %
Platelets: 432 10*3/uL — ABNORMAL HIGH (ref 150–400)
RBC: 3.95 MIL/uL (ref 3.87–5.11)
RDW: 24.8 % — ABNORMAL HIGH (ref 11.5–15.5)
WBC: 4.5 10*3/uL (ref 4.0–10.5)
nRBC: 0 % (ref 0.0–0.2)

## 2020-05-03 LAB — BASIC METABOLIC PANEL
Anion gap: 9 (ref 5–15)
BUN: <5 mg/dL — ABNORMAL LOW (ref 8–23)
CO2: 24 mmol/L (ref 22–32)
Calcium: 8.1 mg/dL — ABNORMAL LOW (ref 8.9–10.3)
Chloride: 104 mmol/L (ref 98–111)
Creatinine, Ser: 0.51 mg/dL (ref 0.44–1.00)
GFR calc Af Amer: >60 mL/min (ref >60)
GFR calc non Af Amer: >60 mL/min (ref >60)
Glucose, Bld: 108 mg/dL — ABNORMAL HIGH (ref 70–99)
Potassium: 3 mmol/L — ABNORMAL LOW (ref 3.5–5.1)
Sodium: 137 mmol/L (ref 135–145)

## 2020-05-03 LAB — HEMOGLOBIN AND HEMATOCRIT, BLOOD
HCT: 30.1 % — ABNORMAL LOW (ref 36.0–46.0)
Hemoglobin: 9.2 g/dL — ABNORMAL LOW (ref 12.0–15.0)

## 2020-05-03 LAB — GLUCOSE, CAPILLARY
Glucose-Capillary: 230 mg/dL — ABNORMAL HIGH (ref 70–99)
Glucose-Capillary: 87 mg/dL (ref 70–99)
Glucose-Capillary: 90 mg/dL (ref 70–99)
Glucose-Capillary: 94 mg/dL (ref 70–99)

## 2020-05-03 LAB — TSH: TSH: 0.996 u[IU]/mL (ref 0.350–4.500)

## 2020-05-03 SURGERY — COLONOSCOPY WITH PROPOFOL
Anesthesia: Monitor Anesthesia Care

## 2020-05-03 MED ORDER — ONDANSETRON HCL 4 MG/2ML IJ SOLN
INTRAMUSCULAR | Status: DC | PRN
  Administered 2020-05-03: 4 mg via INTRAVENOUS

## 2020-05-03 MED ORDER — LACTATED RINGERS IV SOLN: INTRAVENOUS | Status: DC | PRN

## 2020-05-03 MED ORDER — PHENYLEPHRINE 40 MCG/ML (10ML) SYRINGE FOR IV PUSH (FOR BLOOD PRESSURE SUPPORT)
PREFILLED_SYRINGE | INTRAVENOUS | Status: DC | PRN
  Administered 2020-05-03: 80 ug via INTRAVENOUS

## 2020-05-03 MED ORDER — PANTOPRAZOLE SODIUM 40 MG PO TBEC
40.0000 mg | DELAYED_RELEASE_TABLET | Freq: Two times a day (BID) | ORAL | 0 refills | Status: DC
Start: 2020-05-03 — End: 2020-05-30

## 2020-05-03 MED ORDER — SODIUM CHLORIDE 0.9 % IV SOLN: INTRAVENOUS | Status: DC

## 2020-05-03 MED ORDER — PREDNISONE 20 MG PO TABS
40.0000 mg | ORAL_TABLET | Freq: Every day | ORAL | 0 refills | Status: DC
Start: 2020-05-04 — End: 2020-05-30

## 2020-05-03 MED ORDER — PREDNISONE 20 MG PO TABS
40.0000 mg | ORAL_TABLET | Freq: Every day | ORAL | Status: DC
  Administered 2020-05-03: 40 mg via ORAL
  Filled 2020-05-03: qty 2

## 2020-05-03 MED ORDER — PROPOFOL 500 MG/50ML IV EMUL
INTRAVENOUS | Status: DC | PRN
  Administered 2020-05-03: 100 ug/kg/min via INTRAVENOUS

## 2020-05-03 MED FILL — predniSONE 20 MG TABS: 20 | 30 days supply | Qty: 60 | Fill #0

## 2020-05-03 MED FILL — PANTOPRAZOLE SOD DR 40 MG T: 40 | 30 days supply | Qty: 60 | Fill #0

## 2020-05-03 SURGICAL SUPPLY — 25 items

## 2020-05-03 NOTE — Telephone Encounter (Signed)
Called pt to inform of results and Dr. Georgeann Oppenheim recommendations.  Unable to contact, LVM to return call

## 2020-05-03 NOTE — Anesthesia Postprocedure Evaluation (Signed)
Anesthesia Post Note  Patient: Amy Hansen  Procedure(s) Performed: COLONOSCOPY WITH PROPOFOL (N/A ) BIOPSY     Patient location during evaluation: PACU Anesthesia Type: MAC Level of consciousness: awake and alert Pain management: pain level controlled Vital Signs Assessment: post-procedure vital signs reviewed and stable Respiratory status: spontaneous breathing, nonlabored ventilation and respiratory function stable Cardiovascular status: stable and blood pressure returned to baseline Postop Assessment: no apparent nausea or vomiting Anesthetic complications: no   No complications documented.  Last Vitals:  Vitals:   05/03/20 0940 05/03/20 0945  BP: (!) 129/58 (!) 141/65  Pulse: 77 76  Resp: 20 19  Temp:    SpO2: 100% 100%    Last Pain:  Vitals:   05/03/20 0945  TempSrc:   PainSc: 0-No pain                 Rambo Sarafian A.

## 2020-05-03 NOTE — Anesthesia Preprocedure Evaluation (Signed)
Anesthesia Evaluation  Patient identified by MRN, date of birth, ID band Patient awake    Reviewed: Allergy & Precautions, NPO status , Patient's Chart, lab work & pertinent test results, reviewed documented beta blocker date and time   Airway Mallampati: II  TM Distance: >3 FB Neck ROM: Full    Dental  (+) Upper Dentures, Dental Advisory Given   Pulmonary former smoker,    Pulmonary exam normal breath sounds clear to auscultation       Cardiovascular hypertension, Pt. on medications Normal cardiovascular exam Rhythm:Regular Rate:Normal  Echo 08/23/16 Left ventricle: The cavity size was normal. Wall thickness was normal. Systolic function was normal. The estimated ejection fraction was in the range of 55% to 60%. Wall motion was normal;there were no regional wall motion abnormalities. Doppler parameters are consistent with abnormal left ventricular relaxation (grade 1 diastolic dysfunction).  - Mitral valve: There was mild regurgitation.  - Left atrium: The atrium was mildly dilated.   EKG 04/25/20 NSR, normal   Neuro/Psych  Headaches, PSYCHIATRIC DISORDERS Anxiety Depression Restless legs syndrome    GI/Hepatic Neg liver ROS, PUD, GERD  Medicated and Controlled,Pancolitis Lower GI bleed Hx/o diverticulosis Hx/o gastric bypass   Endo/Other  diabetes  Renal/GU Renal InsufficiencyRenal disease   Interstitial cystitis    Musculoskeletal negative musculoskeletal ROS (+)   Abdominal   Peds  Hematology  (+) anemia , Transfused 3 units of PRBC   Anesthesia Other Findings   Reproductive/Obstetrics                             Anesthesia Physical Anesthesia Plan  ASA: III  Anesthesia Plan: MAC   Post-op Pain Management:    Induction: Intravenous  PONV Risk Score and Plan: 2 and Propofol infusion, Treatment may vary due to age or medical condition and Ondansetron  Airway Management  Planned: Natural Airway, Nasal Cannula and Simple Face Mask  Additional Equipment:   Intra-op Plan:   Post-operative Plan:   Informed Consent: I have reviewed the patients History and Physical, chart, labs and discussed the procedure including the risks, benefits and alternatives for the proposed anesthesia with the patient or authorized representative who has indicated his/her understanding and acceptance.     Dental advisory given  Plan Discussed with: CRNA and Anesthesiologist  Anesthesia Plan Comments:         Anesthesia Quick Evaluation

## 2020-05-03 NOTE — Telephone Encounter (Signed)
Patient returning call for lab results.

## 2020-05-03 NOTE — Telephone Encounter (Signed)
Pt has been notified of results and Dr. Georgeann Oppenheim recommendations.

## 2020-05-03 NOTE — Interval H&P Note (Signed)
History and Physical Interval Note:  05/03/2020 8:13 AM  Amy Hansen  has presented today for surgery, with the diagnosis of iron def anemia.  passing clots of blood.  transfusions of PRBCs september 2021 and 04/2020.  colitis per ct.  using ibuprofen..  The various methods of treatment have been discussed with the patient and family. After consideration of risks, benefits and other options for treatment, the patient has consented to  Procedure(s): COLONOSCOPY WITH PROPOFOL (N/A) ESOPHAGOGASTRODUODENOSCOPY (EGD) WITH PROPOFOL (N/A) as a surgical intervention.  The patient's history has been reviewed, patient examined, no change in status, stable for surgery.  I have reviewed the patient's chart and labs.  Questions were answered to the patient's satisfaction.     Milus Banister

## 2020-05-03 NOTE — Progress Notes (Signed)
Discharge instructions reviewed with pt and her husband by other RN Remo Lipps.  Copy of instructions and scripts given to pt.  Pt d/c'd via wheelchair with belongings, with husband.            Escorted by hospital volunteer.

## 2020-05-03 NOTE — Telephone Encounter (Signed)
-----   Message from [name], MD sent at 04/28/2020  9:14 AM EDT ----- Sherald Hess inform stool test shows significant inflammation which can be from the NSAID's that she was taking- she has to follow up with her GI Dr [name], [name], PA-C   Dr [name] MD,MRCP [facility]) Gastroenterology/Hepatology Pager: [phone]

## 2020-05-03 NOTE — Op Note (Signed)
[facility] Patient Name: Amy Hansen Procedure Date : 05/03/2020 MRN: [id number] Attending MD: Milus Banister , MD Date of Birth: 1956/05/18 CSN: [id number] Age: 64 Admit Type: Inpatient Procedure:                Colonoscopy Indications:              Diarrhea, intermittently blood, abnormal colon on                            recent CT scan, QOD high dose NSAID use Providers:                Milus Banister, MD, Burtis Junes, RN, Erenest Rasher,                            RN, Ladona Ridgel, Technician, Viann Fish, CRNA Referring MD:              Medicines:                Monitored Anesthesia Care Complications:            No immediate complications. Estimated blood loss:                            None. Estimated Blood Loss:     Estimated blood loss: none. Procedure:                Pre-Anesthesia Assessment:                           - Prior to the procedure, a History and Physical                            was performed, and patient medications and                            allergies were reviewed. The patient's tolerance of                            previous anesthesia was also reviewed. The risks                            and benefits of the procedure and the sedation                            options and risks were discussed with the patient.                            All questions were answered, and informed consent                            was obtained. Prior Anticoagulants: The patient has                            taken no previous anticoagulant or antiplatelet  agents. ASA Grade Assessment: II - A patient with                            mild systemic disease. After reviewing the risks                            and benefits, the patient was deemed in                            satisfactory condition to undergo the procedure.                           After obtaining informed consent, the colonoscope                             was passed under direct vision. Throughout the                            procedure, the patient's blood pressure, pulse, and                            oxygen saturations were monitored continuously. The                            CF-HQ190L ([id number]) Olympus colonoscope was                            introduced through the anus and advanced to the the                            cecum, identified by appendiceal orifice and                            ileocecal valve. The colonoscopy was performed                            without difficulty. The patient tolerated the                            procedure well. The quality of the bowel                            preparation was excellent. The terminal ileum,                            ileocecal valve, appendiceal orifice, and rectum                            were photographed. Scope In: 9:01:39 AM Scope Out: 9:20:09 AM Scope Withdrawal Time: 0 hours 12 minutes 14 seconds  Total Procedure Duration: 0 hours 18 minutes 30 seconds  Findings:      The terminal ileum appeared normal. Biopsies were taken with a cold       forceps for histology (jar1).  There was mild to moderate inflammation from anus to cecum. This was       characterized by erythema, several punctate erosions which were most       signficant in the left colon. Biopsies taken from right (jar 2) and left       (jar 3)      The exam was otherwise without abnormality on direct and retroflexion       views. Impression:               - Mild to moderate inflammation from anus to cecum                            with a normal appearing terminal ileum. Multiple                            biopsies taken.                           - I am suspicious that she has IBD. Crohn's colitis                            favored over UC. Await final path results. Will                            start prednisone 31m once daily now. She is                            probably OK for d/c later  today or tomorrow and can                            follow up with her primary GI (Dr. TAlice Reichert). I will                            communicate the biopsy results to her when back. Recommendation:           - Return patient to hospital floor.                           - Will start prednisone for likely new diagnosis IBD Procedure Code(s):        --- Professional ---                           [id number] Colonoscopy, flexible; with biopsy, single                            or multiple Diagnosis Code(s):        --- Professional ---                           K52.9, Noninfective gastroenteritis and colitis,                            unspecified CPT copyright 2019 [employer]. All rights reserved. The codes documented in this report are preliminary and upon coder review may  be revised  to meet current compliance requirements. Milus Banister, MD 05/03/2020 9:36:09 AM This report has been signed electronically. Number of Addenda: 0

## 2020-05-03 NOTE — Transfer of Care (Signed)
Immediate Anesthesia Transfer of Care Note  Patient: Amy Hansen  Procedure(s) Performed: COLONOSCOPY WITH PROPOFOL (N/A ) BIOPSY  Patient Location: Endoscopy Unit  Anesthesia Type:MAC  Level of Consciousness: awake, alert  and oriented  Airway & Oxygen Therapy: Patient Spontanous Breathing  Post-op Assessment: Report given to RN and Post -op Vital signs reviewed and stable  Post vital signs: Reviewed and stable  Last Vitals:  Vitals Value Taken Time  BP 111/53 05/03/20 0927  Temp 36.5 C 05/03/20 0927  Pulse 81 05/03/20 0929  Resp 18 05/03/20 0929  SpO2 100 % 05/03/20 0929  Vitals shown include unvalidated device data.  Last Pain:  Vitals:   05/03/20 0927  TempSrc:   PainSc: 0-No pain         Complications: No complications documented.

## 2020-05-03 NOTE — Discharge Summary (Signed)
Physician Discharge Summary  Amy Hansen GYI:[id number] DOB: Oct 20, 1955 DOA: 04/30/2020  PCP: Volney American, PA-C  Admit date: 04/30/2020 Discharge date: 05/03/2020  Recommendations for Outpatient Follow-up:  1. Discharge to home 2. Follow up with Dr. Alice Reichert at next available appointment. 3. Follow up with PCP in 7-10 days. Have chemistry and CBC drawn at that visit.  Discharge Diagnoses: Principal diagnosis is #1 1. IBD - Croh's Colitis favored over ulcerative colitis Biopsies are pending 2. Hemorrhagic diarrhea 3. Hypertension 4. DM II 5. Hypokalemia 6. Abnormal CT abdomen: Pancolitis  Discharge Condition: Fair  Disposition: Home  Diet recommendation: Regular  There were no vitals filed for this visit.  History of present illness:  Amy Hansen is a 64 y.o. female with medical history significant for type 2 diabetes, hypertension, anxiety/depression, OCD, RLS who presents to the ED for evaluation of bloody diarrhea.  Patient was recently admitted at [facility] hospital from 9/23-9/26 for pancolitis.  She was treated with IV Zosyn while in hospital and discharged on Augmentin.  She had associated rectal bleeding requiring 2 unit PRBC transfusions.  Colitis and GI bleeding were thought to be NSAID related.  C. difficile and GI pathogen panels were negative.  Patient states that since discharge she has been having continued black color watery diarrhea up to 5-6 times per day.  She has had poor oral intake with associated nausea and nonbloody nonbilious emesis.  Yesterday she developed bright red bloody diarrhea.  She has had lightheadedness and dizziness without syncope.  Patient states she was told to discontinue Excedrin which she has avoided.  However she says that she has been continuing to alternate ibuprofen with Tylenol for management of her back pain.  She was also back in the hospital 9/27-9/28 for generalized weakness.  She underwent stroke work-up without  evidence of CVA.  There is question of TIA and she was started on aspirin 81 mg daily which she has been taking since leaving the hospital.  ED Course:  Initial vitals showed BP 90/61, pulse 106, RR 16, temp 98.1 Fahrenheit, SPO2 100% on room air.  Labs notable for sodium 132, potassium 3.3, bicarb 19, BUN 5, creatinine 0.64, serum glucose 172, AST 14, ALT 11, alk phos 104, total bilirubin 0.2, WBC 8.3, hemoglobin 9.0 (previously 10.9 on 04/25/2020), platelets 470,000.  Patient was given 1 L LR with improvement in blood pressure.  Repeat CBC was obtained which showed repeat hemoglobin of 8.5.  FOBT was positive.  Patient had a bloody bowel movement while in the ED.  GI pathogen panel was obtained and pending.  Respiratory PCR panel was obtained and pending.  The hospitalist service was consulted to admit for further evaluation and management.  Hospital Course:  The patient is a 64 yr old woman who was admitted by my colleague, Dr. Posey Pronto this morning. She presented to [facility] with complaints of black watery diarrhea up to 5-6 x a day after discharge from [facility] on 04/24/2020 for pancolitis with rectal bleeding requiring transfusion of 2 units of PRBC's. This colitis was thought to be NSAID related. Records indicate that the patient was told not to take NSAIDS at home, although the patient asserts that she was only told to avoid aspirin. She thinks that she was told that she could take NSAIDS.  She came back to the hospital on 9/27-9/28 for generalized weakness. Stroke work-up was performed on this occasion and was found to be negative for CVA. She was sent home on ASA 81 mg daily  for a presumed TIA.  The patient states that her diarrhea turned bright red yesterday. She states taht she has not been able to eat due to nausea She has been lightheaded, but has not passed out.   In the ED she was found to be hypotensive with systolic in the 54'U. Pulse was elevated at 106. Her temperature was normal  and her SaO2 was 100% on room air.   Overnight the patient's hemoglobin has dropped to 7.0. She has dropped 3.9 grams of hemoglobin since her discharge from [facility] on 04/25/2020. I have transfused her with one unit of PRBC's. GI has been consulted. They will take the patient for colonoscopy tomorrow and then possibly EGD if no source of her symptoms is apparent on colonoscopy.  The patient underwent colonoscopy with GI today. Dr. Ardis Hughs demonstrated finding consistent with IBD. He favors Chron's Colitis over ulcerative colitis. Biopsies taken.  She has been placed on oral steroids in instructed to follow up with her GI Dr., Dr. Alice Reichert ASAP. Biopsies are pending.  She has been cleared for discharge.  Today's assessment: S: The patient is resting comfortably. No new complaints. O: Vitals:  Vitals:   05/03/20 0945 05/03/20 1000  BP: (!) 141/65 (!) 151/74  Pulse: 76 74  Resp: 19 20  Temp:  98 F (36.7 C)  SpO2: 100% 99%   Exam:  Constitutional:   The patient is awake, alert, and oriented x 3. No acute distress. Respiratory:   No increased work of breathing.  No wheezes, rales, or rhonchi  No tactile fremitus Cardiovascular:   Regular rate and rhythm  No murmurs, ectopy, or gallups.  No lateral PMI. No thrills. Abdomen:   Abdomen is soft, non-tender, non-distended  No hernias, masses, or organomegaly  Normoactive bowel sounds.  Musculoskeletal:   No cyanosis, clubbing, or edema Skin:   No rashes, lesions, ulcers  palpation of skin: no induration or nodules Neurologic:   CN 2-12 intact  Sensation all 4 extremities intact Psychiatric:   Mental status ? Mood, affect appropriate ? Orientation to person, place, time   judgment and insight appear intact   Discharge Instructions  Discharge Instructions    Activity as tolerated - No restrictions   Complete by: As directed    Call MD for:  persistant nausea and vomiting   Complete by: As directed    Call  MD for:  temperature >100.4   Complete by: As directed    Diet - low sodium heart healthy   Complete by: As directed    Discharge instructions   Complete by: As directed    Discharge to home Follow up with Dr. Alice Reichert at next available appointment. Follow up with PCP in 7-10 days. Have chemistry and CBC drawn at that visit.   Increase activity slowly   Complete by: As directed      Allergies as of 05/03/2020      Reactions   Avelox [moxifloxacin Hcl In Nacl] Anaphylaxis   Bactrim [sulfamethoxazole-trimethoprim] Anaphylaxis   Ciprofloxacin Other (See Comments)   Pt states she was told never to take this as it is in the same family as Avelox.    Depakote [divalproex Sodium]    Imitrex [sumatriptan] Other (See Comments)   Neck and shoulder pain   Stadol [butorphanol] Rash      Medication List    STOP taking these medications   aspirin EC 81 MG tablet   estradiol 0.1 MG/GM vaginal cream Commonly known as: ESTRACE VAGINAL   promethazine  25 MG tablet Commonly known as: PHENERGAN     TAKE these medications   ALPRAZolam 0.5 MG tablet Commonly known as: XANAX Take 1 tablet (0.5 mg total) by mouth 4 (four) times daily as needed for anxiety.   buPROPion 150 MG 24 hr tablet Commonly known as: WELLBUTRIN XL Take 3 tablets (450 mg total) by mouth daily.   ferrous sulfate 325 (65 FE) MG EC tablet Take 1 tablet (325 mg total) by mouth 2 (two) times daily.   furosemide 20 MG tablet Commonly known as: LASIX Take 1 tablet (20 mg total) by mouth daily.   glipiZIDE 5 MG tablet Commonly known as: GLUCOTROL TAKE 1 TABLET BY MOUTH TWICE DAILY WITH MEALS (APPOINTMENT  REQUIRED  FOR  FUTURE  REFILLS) What changed:   how much to take  how to take this  when to take this   lamoTRIgine 200 MG tablet Commonly known as: LAMICTAL TAKE 2 TABLETS BY MOUTH AT BEDTIME   loperamide 2 MG capsule Commonly known as: IMODIUM Take 1 capsule (2 mg total) by mouth as needed for diarrhea or  loose stools.   metFORMIN 1000 MG tablet Commonly known as: GLUCOPHAGE TAKE 1 TABLET BY MOUTH TWICE DAILY WITH MEALS . APPOINTMENT REQUIRED FOR FUTURE REFILLS What changed:   how much to take  how to take this  when to take this   multivitamin with minerals Tabs tablet Take 1 tablet by mouth daily.   pantoprazole 40 MG tablet Commonly known as: PROTONIX Take 1 tablet (40 mg total) by mouth 2 (two) times daily.   Pen Needles 32G X 4 MM Misc 1 Units by Does not apply route at bedtime.   pramipexole 1 MG tablet Commonly known as: MIRAPEX Take 1 tablet (1 mg total) by mouth at bedtime.   predniSONE 20 MG tablet Commonly known as: DELTASONE Take 2 tablets (40 mg total) by mouth daily with breakfast. Start taking on: May 04, 2020   risperiDONE 3 MG tablet Commonly known as: RISPERDAL TAKE 1 TABLET BY MOUTH EVERY DAY AT BEDTIME   sertraline 100 MG tablet Commonly known as: ZOLOFT Take 1.5 tablets (150 mg total) by mouth daily.   Toujeo Max SoloStar 300 UNIT/ML Solostar Pen Generic drug: insulin glargine (2 Unit Dial) Inject 24 units daily. Titrate as instructed. Max daily dose 40 units      Allergies  Allergen Reactions  . Avelox [Moxifloxacin Hcl In Nacl] Anaphylaxis  . Bactrim [Sulfamethoxazole-Trimethoprim] Anaphylaxis  . Ciprofloxacin Other (See Comments)    Pt states she was told never to take this as it is in the same family as Avelox.   . Depakote [Divalproex Sodium]   . Imitrex [Sumatriptan] Other (See Comments)    Neck and shoulder pain  . Stadol [Butorphanol] Rash    The results of significant diagnostics from this hospitalization (including imaging, microbiology, ancillary and laboratory) are listed below for reference.    Significant Diagnostic Studies: CT HEAD WO CONTRAST  Result Date: 04/25/2020 CLINICAL DATA:  Dizziness and diplopia EXAM: CT HEAD WITHOUT CONTRAST TECHNIQUE: Contiguous axial images were obtained from the base of the skull  through the vertex without intravenous contrast. COMPARISON:  Dec 14, 2019 FINDINGS: Brain: Ventricles and sulci are normal in size and configuration. There is no intracranial mass, hemorrhage, extra-axial fluid collection, or midline shift. There is mild decreased attenuation in the anterior centra semiovale bilaterally as well as in the anterior limbs of the left internal and external capsule. Elsewhere brain parenchyma appears unremarkable.  No acute infarct evident. Vascular: No hyperdense vessel. There is mild calcification in each carotid siphon region. Skull: The bony calvarium appears intact. Sinuses/Orbits: There is mild mucosal thickening in several ethmoid air cells. Other visualized paranasal sinuses are clear. Visualized orbits appear symmetric bilaterally. Other: Mastoid air cells are clear. IMPRESSION: Decreased attenuation in the anterior periventricular white matter and in portions of the anterior limbs of the internal and external capsules. 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 Diplopia does raise concern for possible underlying demyelination. No acute appearing infarct. The appearance of the brain parenchyma is stable compared to previous study. No mass or hemorrhage. Mild arterial vascular calcification noted. Mild mucosal thickening noted in several ethmoid air cells. Electronically Signed   By: Lowella Grip III M.D.   On: 04/25/2020 12:26   MR ANGIO HEAD WO CONTRAST  Result Date: 04/26/2020 CLINICAL DATA:  64 year old female with dizziness, ataxia, weakness, left upper extremity numbness, abnormal speech, diplopia onset within the last 3 days. EXAM: MRA HEAD WITHOUT CONTRAST TECHNIQUE: Angiographic images of the Circle of Willis were obtained using MRA technique without intravenous contrast. COMPARISON:  Brain MRI and neck MRA today reported separately. FINDINGS: Intrinsic T1 hyperintensity as noted on the MRI this morning, with associated increased signal seen in the  venous vasculature here, although not significantly affecting the quality of this intracranial MRA. Antegrade flow in the posterior circulation with codominant distal vertebral arteries. No distal vertebral stenosis. Patent PICA origins, vertebrobasilar junction, AICA origins, basilar artery, SCA and PCA origins. Posterior communicating arteries are diminutive or absent. Bilateral PCA branches are within normal limits. Antegrade flow in both ICA siphons. No siphon stenosis. Patent carotid termini. Dominant left and diminutive right ACA A1 segments. Patent MCA and ACA origins. Anterior communicating artery and visible bilateral ACA branches are within normal limits. The left MCA M1 bifurcates early without stenosis. Right MCA M1 and right MCA bifurcation are patent without stenosis. Visible bilateral MCA branches are within normal limits. IMPRESSION: 1.  Negative intracranial MRA. 2. Intrinsic T1 hyperintensity as described on the MRI this morning, but not significantly affecting the quality of this MRA. Electronically Signed   By: Genevie Ann M.D.   On: 04/26/2020 06:29   MR ANGIO NECK WO CONTRAST  Result Date: 04/26/2020 CLINICAL DATA:  64 year old female with dizziness, ataxia, weakness, left upper extremity numbness, abnormal speech, diplopia onset within the last 3 days. EXAM: MRA NECK WITHOUT CONTRAST TECHNIQUE: Multiplanar, multiecho pulse sequences of the brain and surrounding structures were obtained without intravenous contrast. Angiographic images of the Circle of Willis were obtained using MRA technique without intravenous contrast. Angiographic images of the neck were obtained using MRA technique without intravenous contrast. Carotid stenosis measurements (when applicable) are obtained utilizing NASCET criteria, using the distal internal carotid diameter as the denominator. COMPARISON:  Brain MRI today reported separately. FINDINGS: 3D time-of-flight neck MRA images reveal antegrade flow in both cervical  carotid and vertebral arteries to the skull base. Both vertebral artery origins are included and appear normal. Codominant vertebral arteries with no evidence of stenosis in the neck. Both carotid bifurcations appear within normal limits. No hemodynamically significant cervical carotid stenosis identified. IMPRESSION: Negative noncontrast neck MRA. Electronically Signed   By: Genevie Ann M.D.   On: 04/26/2020 06:26   MR BRAIN WO CONTRAST  Result Date: 04/26/2020 CLINICAL DATA:  64 year old female with dizziness, ataxia, weakness, left upper extremity numbness, abnormal speech, diplopia onset within the last 3 days. EXAM: MRI HEAD WITHOUT CONTRAST TECHNIQUE:  Multiplanar, multiecho pulse sequences of the brain and surrounding structures were obtained without intravenous contrast. COMPARISON:  Head CT 04/25/2020 and earlier. FINDINGS: Brain: No restricted diffusion to suggest acute infarction. No midline shift, mass effect, evidence of mass lesion, ventriculomegaly, extra-axial collection or acute intracranial hemorrhage. Cervicomedullary junction and pituitary are within normal limits. Scattered asymmetric T2 and FLAIR hyperintensity in the cerebral white matter, with a small area of cortical or subcortical encephalomalacia in the left parietal lobe (series 15, image 39). The deep white matter capsules are involved bilaterally. The extent is moderate for age. No chronic cerebral blood products or other cortical encephalomalacia identified. Minimal T2 heterogeneity in the deep gray nuclei. The brainstem and cerebellum appear within normal limits. Vascular: Major intracranial vascular flow voids are preserved. Intrinsic T1 hyperintensity within the major intracranial vascular structures. If the patient did not undergo IV contrast administration prior to these images the intravenous iron could explain this appearance. Skull and upper cervical spine: Negative visible cervical spine for age. Visualized bone marrow signal  is within normal limits. Sinuses/Orbits: Normal suprasellar cistern. Unremarkable noncontrast cavernous sinus. Orbits appear symmetric and within normal limits. Paranasal sinuses remain well pneumatized. Other: Trace bilateral mastoid air cell fluid. Negative visible nasopharynx aside from a probable tiny retention cyst in the midline (series 10, image 5). Grossly normal visible internal auditory structures. Visible scalp and face appear negative. IMPRESSION: 1.  No acute intracranial abnormality. 2. Mild to moderate for age signal changes in the brain most compatible with chronic small vessel disease. 3. Intrinsic T1 signal in the major vascular structures. If this patient did not in a diverting Lea receive gadolinium contrast prior to this exam then recent administration of intravenous iron could also have this appearance. Electronically Signed   By: Genevie Ann M.D.   On: 04/26/2020 06:13   CT ABDOMEN PELVIS W CONTRAST  Result Date: 04/21/2020 CLINICAL DATA:  Diarrhea for 1 month. EXAM: CT ABDOMEN AND PELVIS WITH CONTRAST TECHNIQUE: Multidetector CT imaging of the abdomen and pelvis was performed using the standard protocol following bolus administration of intravenous contrast. CONTRAST:  68m OMNIPAQUE IOHEXOL 300 MG/ML  SOLN COMPARISON:  June 19, 2019 FINDINGS: Lower chest: There are stable small pulmonary nodules at the lung bases measuring up to approximately 5 mm. No further follow-up is recommended.The heart size is normal. Hepatobiliary: The liver is normal. Status post cholecystectomy.There is mild intrahepatic and extrahepatic biliary ductal dilatation. Pancreas: There appear to be a cluster cysts at the pancreatic head, similar to prior study. Spleen: Unremarkable. Adrenals/Urinary Tract: --Adrenal glands: Unremarkable. --Right kidney/ureter: No hydronephrosis or radiopaque kidney stones. --Left kidney/ureter: No hydronephrosis or radiopaque kidney stones. --Urinary bladder: There is a punctate  focus of gas within the urinary bladder. 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 --Small bowel: Unremarkable. --Colon: There is diffuse circumferential wall thickening of virtually all of the colon, most notably at the level of the cecum and ascending colon. --Appendix: Normal. Vascular/Lymphatic: Atherosclerotic calcification is present within the non-aneurysmal abdominal aorta, without hemodynamically significant stenosis. --No retroperitoneal lymphadenopathy. --there are few mildly enlarged mesenteric lymph nodes, most notably in the right lower quadrant. These are presumably reactive. --No pelvic or inguinal lymphadenopathy. Reproductive: Status post hysterectomy. No adnexal mass. Other: No ascites or free air. The abdominal wall is normal. Musculoskeletal. No acute displaced fractures. IMPRESSION: 1. Diffuse circumferential wall thickening of virtually all of the colon, most notably at the level of the cecum and ascending colon, consistent  with infectious or inflammatory colitis. 2. There is a punctate focus of gas within the urinary bladder. Correlate for history of recent instrumentation. Aortic Atherosclerosis (ICD10-I70.0). Electronically Signed   By: Constance Holster M.D.   On: 04/21/2020 18:04    Microbiology: Recent Results (from the past 240 hour(s))  Respiratory Panel by RT PCR (Flu A&B, Covid) - Nasopharyngeal Swab     Status: None   Collection Time: 04/25/20  7:25 PM   Specimen: Nasopharyngeal Swab  Result Value Ref Range Status   SARS Coronavirus 2 by RT PCR NEGATIVE NEGATIVE Final    Comment: (NOTE) SARS-CoV-2 target nucleic acids are NOT DETECTED.  The SARS-CoV-2 RNA is generally detectable in upper respiratoy specimens during the acute phase of infection. The lowest concentration of SARS-CoV-2 viral copies this assay can detect is 131 copies/mL. A negative result does not preclude SARS-Cov-2 infection and  should not be used as the sole basis for treatment or other patient management decisions. A negative result may occur with  improper specimen collection/handling, submission of specimen other than nasopharyngeal swab, presence of viral mutation(s) within the areas targeted by this assay, and inadequate number of viral copies (<131 copies/mL). A negative result must be combined with clinical observations, patient history, and epidemiological information. The expected result is Negative.  Fact Sheet for Patients:  PinkCheek.be  Fact Sheet for Healthcare Providers:  GravelBags.it  This test is no t yet approved or cleared by the [place] FDA and  has been authorized for detection and/or diagnosis of SARS-CoV-2 by FDA under an Emergency Use Authorization (EUA). This EUA will remain  in effect (meaning this test can be used) for the duration of the COVID-19 declaration under Section 564(b)(1) of the Act, 21 U.S.C. section 360bbb-3(b)(1), unless the authorization is terminated or revoked sooner.     Influenza A by PCR NEGATIVE NEGATIVE Final   Influenza B by PCR NEGATIVE NEGATIVE Final    Comment: (NOTE) The Xpert Xpress SARS-CoV-2/FLU/RSV assay is intended as an aid in  the diagnosis of influenza from Nasopharyngeal swab specimens and  should not be used as a sole basis for treatment. Nasal washings and  aspirates are unacceptable for Xpert Xpress SARS-CoV-2/FLU/RSV  testing.  Fact Sheet for Patients: PinkCheek.be  Fact Sheet for Healthcare Providers: GravelBags.it  This test is not yet approved or cleared by the [place] FDA and  has been authorized for detection and/or diagnosis of SARS-CoV-2 by  FDA under an Emergency Use Authorization (EUA). This EUA will remain  in effect (meaning this test can be used) for the duration of the  Covid-19 declaration  under Section 564(b)(1) of the Act, 21  U.S.C. section 360bbb-3(b)(1), unless the authorization is  terminated or revoked. Performed at [facility], [address] [place], [place] [address]   Gastrointestinal Panel by PCR , Stool     Status: None   Collection Time: 04/30/20 11:13 PM   Specimen: Stool  Result Value Ref Range Status   Campylobacter species NOT DETECTED NOT DETECTED Final   Plesimonas shigelloides NOT DETECTED NOT DETECTED Final   Salmonella species NOT DETECTED NOT DETECTED Final   Yersinia enterocolitica NOT DETECTED NOT DETECTED Final   Vibrio species NOT DETECTED NOT DETECTED Final   Vibrio cholerae NOT DETECTED NOT DETECTED Final   Enteroaggregative E coli (EAEC) NOT DETECTED NOT DETECTED Final   Enteropathogenic E coli (EPEC) NOT DETECTED NOT DETECTED Final   Enterotoxigenic E coli (ETEC) NOT DETECTED NOT DETECTED Final   Shiga  like toxin producing E coli (STEC) NOT DETECTED NOT DETECTED Final   Shigella/Enteroinvasive E coli (EIEC) NOT DETECTED NOT DETECTED Final   Cryptosporidium NOT DETECTED NOT DETECTED Final   Cyclospora cayetanensis NOT DETECTED NOT DETECTED Final   Entamoeba histolytica NOT DETECTED NOT DETECTED Final   Giardia lamblia NOT DETECTED NOT DETECTED Final   Adenovirus F40/41 NOT DETECTED NOT DETECTED Final   Astrovirus NOT DETECTED NOT DETECTED Final   Norovirus GI/GII NOT DETECTED NOT DETECTED Final   Rotavirus A NOT DETECTED NOT DETECTED Final   Sapovirus (I, II, IV, and V) NOT DETECTED NOT DETECTED Final    Comment: Performed at [facility], [address] [place], [place] [address]  Respiratory Panel by RT PCR (Flu A&B, Covid) - Nasopharyngeal Swab     Status: None   Collection Time: 05/01/20 12:47 AM   Specimen: Nasopharyngeal Swab  Result Value Ref Range Status   SARS Coronavirus 2 by RT PCR NEGATIVE NEGATIVE Final    Comment: (NOTE) SARS-CoV-2 target nucleic acids are NOT DETECTED.  The SARS-CoV-2 RNA is  generally detectable in upper respiratoy specimens during the acute phase of infection. The lowest concentration of SARS-CoV-2 viral copies this assay can detect is 131 copies/mL. A negative result does not preclude SARS-Cov-2 infection and should not be used as the sole basis for treatment or other patient management decisions. A negative result may occur with  improper specimen collection/handling, submission of specimen other than nasopharyngeal swab, presence of viral mutation(s) within the areas targeted by this assay, and inadequate number of viral copies (<131 copies/mL). A negative result must be combined with clinical observations, patient history, and epidemiological information. The expected result is Negative.  Fact Sheet for Patients:  PinkCheek.be  Fact Sheet for Healthcare Providers:  GravelBags.it  This test is no t yet approved or cleared by the [place] FDA and  has been authorized for detection and/or diagnosis of SARS-CoV-2 by FDA under an Emergency Use Authorization (EUA). This EUA will remain  in effect (meaning this test can be used) for the duration of the COVID-19 declaration under Section 564(b)(1) of the Act, 21 U.S.C. section 360bbb-3(b)(1), unless the authorization is terminated or revoked sooner.     Influenza A by PCR NEGATIVE NEGATIVE Final   Influenza B by PCR NEGATIVE NEGATIVE Final    Comment: (NOTE) The Xpert Xpress SARS-CoV-2/FLU/RSV assay is intended as an aid in  the diagnosis of influenza from Nasopharyngeal swab specimens and  should not be used as a sole basis for treatment. Nasal washings and  aspirates are unacceptable for Xpert Xpress SARS-CoV-2/FLU/RSV  testing.  Fact Sheet for Patients: PinkCheek.be  Fact Sheet for Healthcare Providers: GravelBags.it  This test is not yet approved or cleared by the [place] FDA and  has been authorized for detection and/or diagnosis of SARS-CoV-2 by  FDA under an Emergency Use Authorization (EUA). This EUA will remain  in effect (meaning this test can be used) for the duration of the  Covid-19 declaration under Section 564(b)(1) of the Act, 21  U.S.C. section 360bbb-3(b)(1), unless the authorization is  terminated or revoked. Performed at [facility], [place] [address] [place], [place] [address]   C Difficile Quick Screen w PCR reflex     Status: None   Collection Time: 05/01/20  5:52 PM   Specimen: STOOL  Result Value Ref Range Status   C Diff antigen NEGATIVE NEGATIVE Final   C Diff toxin NEGATIVE NEGATIVE Final  C Diff interpretation No C. difficile detected.  Final    Comment: Performed at [facility], [place] [address], [place], [place] [address]     Labs: Basic Metabolic Panel: Recent Labs  Lab 04/30/20 1619 05/01/20 0226 05/02/20 0147 05/03/20 0028  NA 132* 136 139 137  K 3.3* 3.5 3.6 3.0*  CL 103 105 105 104  CO2 19* 25 31 24   GLUCOSE 172* 103* 90 108*  BUN 5* <5* <5* <5*  CREATININE 0.64 0.50 0.57 0.51  CALCIUM 7.7* 7.9* 7.9* 8.1*   Liver Function Tests: Recent Labs  Lab 04/30/20 1619 05/02/20 0147  AST 14* 15  ALT 11 13  ALKPHOS 104 93  BILITOT 0.2* 0.5  PROT 5.0* 4.0*  ALBUMIN 1.8* 1.5*   No results for input(s): LIPASE, AMYLASE in the last 168 hours. No results for input(s): AMMONIA in the last 168 hours. CBC: Recent Labs  Lab 04/30/20 1619 04/30/20 1619 04/30/20 2213 04/30/20 2213 05/01/20 0226 05/01/20 0921 05/02/20 0147 05/02/20 1004 05/02/20 1658 05/03/20 0028 05/03/20 1040  WBC 8.3  --  5.5  --  4.5  --  3.8*  --   --  4.5  --   NEUTROABS  --   --   --   --   --   --  2.0  --   --  2.9  --   HGB 9.0*   < > 8.5*   < > 7.7*   < > 7.0* 7.8* 9.1* 9.0* 9.2*  HCT 30.3*   < > 27.0*   < > 25.1*   < > 22.9* 25.7* 29.9* 29.0* 30.1*  MCV 72.7*  --  71.8*  --  72.3*  --  73.2*  --   --  73.4*  --    PLT 470*  --  444*  --  435*  --  383  --   --  432*  --    < > = values in this interval not displayed.   Cardiac Enzymes: No results for input(s): CKTOTAL, CKMB, CKMBINDEX, TROPONINI in the last 168 hours. BNP: BNP (last 3 results) No results for input(s): BNP in the last 8760 hours.  ProBNP (last 3 results) No results for input(s): PROBNP in the last 8760 hours.  CBG: Recent Labs  Lab 05/02/20 1950 05/03/20 0013 05/03/20 0402 05/03/20 0735 05/03/20 1143  GLUCAP 145* 87 94 90 230*    Principal Problem:   BRBPR (bright red blood per rectum) Active Problems:   Hypertension   Iron deficiency anemia   Colitis   Hypokalemia   Type 2 diabetes mellitus (HCC)   Abnormal CT scan, gastrointestinal tract   Time coordinating discharge: 38 minutes.  Signed:        Caryl Manas, DO [facility]  05/03/2020, 7:42 PM

## 2020-05-03 NOTE — Telephone Encounter (Signed)
-----   Message from [name], MD sent at 04/28/2020  9:14 AM EDT ----- Sherald Hess inform stool test shows significant inflammation which can be from the NSAID's that she was taking- she has to follow up with her GI Dr [name], [name], PA-C   Dr [name] MD,MRCP [facility]) Gastroenterology/Hepatology Pager: [phone]

## 2020-05-05 ENCOUNTER — Telehealth: Payer: Self-pay

## 2020-05-05 LAB — SURGICAL PATHOLOGY

## 2020-05-05 NOTE — Telephone Encounter (Signed)
Called and spoke to pt.  She confirmed she can come to appt on Nov 1st at 9:00am to see [facility]. She indicated she has the paperwork with our address,

## 2020-05-05 NOTE — Telephone Encounter (Signed)
-----   Message from Roetta Sessions, [place] sent at 05/03/2020 12:25 PM EDT ----- Regarding: FW: follow up Call patient and let her know she is scheduled with Jaclyn Shaggy on Monday, 11-1 at 9:00 am    ----- Message ----- From: Yetta Flock, MD Sent: 05/03/2020  12:07 PM EDT To: Roetta Sessions, CMA Subject: follow up                                      Hey Jan, This patient will need follow up with me or APP in the next 3 weeks or so. To be discharged later today or tomorrow. She has seen multiple GIs recently but wants to follow with [name]. Thanks

## 2020-05-06 ENCOUNTER — Other Ambulatory Visit: Payer: Self-pay

## 2020-05-06 ENCOUNTER — Encounter (HOSPITAL_COMMUNITY): Payer: Self-pay | Admitting: Gastroenterology

## 2020-05-06 ENCOUNTER — Ambulatory Visit: Payer: Self-pay | Admitting: Licensed Clinical Social Worker

## 2020-05-06 ENCOUNTER — Ambulatory Visit: Payer: Self-pay | Admitting: General Practice

## 2020-05-06 ENCOUNTER — Ambulatory Visit (INDEPENDENT_AMBULATORY_CARE_PROVIDER_SITE_OTHER): Payer: Self-pay | Admitting: Nurse Practitioner

## 2020-05-06 ENCOUNTER — Telehealth: Payer: Self-pay | Admitting: Gastroenterology

## 2020-05-06 VITALS — BP 101/70 | HR 96 | Temp 98.7°F | Ht 62.0 in | Wt 120.0 lb

## 2020-05-06 DIAGNOSIS — F329 Major depressive disorder, single episode, unspecified: Secondary | ICD-10-CM

## 2020-05-06 DIAGNOSIS — Z09 Encounter for follow-up examination after completed treatment for conditions other than malignant neoplasm: Secondary | ICD-10-CM

## 2020-05-06 DIAGNOSIS — I10 Essential (primary) hypertension: Secondary | ICD-10-CM

## 2020-05-06 DIAGNOSIS — E1165 Type 2 diabetes mellitus with hyperglycemia: Secondary | ICD-10-CM

## 2020-05-06 DIAGNOSIS — K529 Noninfective gastroenteritis and colitis, unspecified: Secondary | ICD-10-CM

## 2020-05-06 DIAGNOSIS — I5032 Chronic diastolic (congestive) heart failure: Secondary | ICD-10-CM

## 2020-05-06 NOTE — Progress Notes (Signed)
BP 101/70 (BP Location: Right Arm, Patient Position: Sitting)   Pulse 96   Temp 98.7 F (37.1 C) (Oral)   Ht 5' 2"  (1.575 m)   Wt 120 lb (54.4 kg)   LMP  (LMP Unknown)   SpO2 98%   BMI 21.95 kg/m    Subjective:    Patient ID: Amy Hansen, female    DOB: 08-16-1955, 64 y.o.   MRN: [id number]  HPI: Amy Hansen is a 64 y.o. female presenting for hospital follow up.  Chief Complaint  Patient presents with  . Hospitalization Follow-up    rectum bleeding and anemia, feels weak today, back pain pt thinks it could be due to being in the bed at the hospital   Transition of [facility] Follow up.   Has had 3 hospital visits in the past 2 weeks.  9/23: Patient admitted to [facility] hospital on for colitis.  CT abdomen was concerning for pancolitis and patient was started on IV antibiotics.  C. difficile was negative.  Occult stool was positive and hemoglobin dropped to 6.9.  She received 2 units of PRBC while at the hospital.  GI was consulted and recommended stopping all NSAIDs and following up as an outpatient after pancolitis has improved.  They also ordered some specific laboratory testing for chronic diarrhea.  She was discharged on Augmentin to complete her course of antibiotics.  She was also discharged with home health physical therapy, and a rolling walker.  She was experiencing some weakness and dizziness with ambulation, likely due to the anemia and GI losses.  She was discharged home on September 26 by Dr. Reesa Chew.   9/27: Patient presented to [facility] for generalized weakness, left upper extremity numbness, difficulty word finding, and diplopia that started 3 days prior to this visit.  She had a CT of the brain that suggested small vessel vascular disease, however there is no appearing infarct, mass, or hemorrhage.  She was placed in observation, and later had an MRI of her head and neck.  These did not show any acute stroke.  She was discharged on September 29 and was told to start a  daily baby aspirin.  10/2: Patient presented to [facility] emergency room for bright red blood per rectum.  At the time, she had stopped taking Excedrin but was alternating Tylenol and ibuprofen for management of her back pain.  She was also taking a daily baby aspirin.  She continued to have bright red blood per rectum in the ED and received 1 unit of packed red blood cells.  GI was consulted.  She had a colonoscopy on October 5 with Dr. Ardis Hughs that showed findings consistent with IBD and Crohn's colitis.  They took biopsies and patient was placed on oral prednisone and instructed to follow-up with GI doctor-Dr. Alice Reichert.  She was discharged on October 5.  Records Requested: completed Records Received:  completed Records Reviewed: completed  Diagnoses on Discharge:  IBD-Crohn's colitis favored over ulcerative colitis.  Hemorrhagic diarrhea Hypertension Type 2 diabetes Hypokalemia Abnormal CT abdomen: Pancolitis   Date of interactive Contact within 48 hours of discharge:  Contact was through: phone  Date of 7 day or 14 day face-to-face visit:    within 7 days  Outpatient Encounter Medications as of 05/06/2020  Medication Sig  . ALPRAZolam (XANAX) 0.5 MG tablet Take 1 tablet (0.5 mg total) by mouth 4 (four) times daily as needed for anxiety.  [name] buPROPion (WELLBUTRIN XL) 150 MG 24 hr tablet Take  3 tablets (450 mg total) by mouth daily.  . ferrous sulfate 325 (65 FE) MG EC tablet Take 1 tablet (325 mg total) by mouth 2 (two) times daily.  . furosemide (LASIX) 20 MG tablet Take 1 tablet (20 mg total) by mouth daily.  [name] glipiZIDE (GLUCOTROL) 5 MG tablet TAKE 1 TABLET BY MOUTH TWICE DAILY WITH MEALS (APPOINTMENT  REQUIRED  FOR  FUTURE  REFILLS) (Patient taking differently: Take 5 mg by mouth 2 (two) times daily before a meal. TAKE 1 TABLET BY MOUTH TWICE DAILY WITH MEALS (APPOINTMENT  REQUIRED  FOR  FUTURE  REFILLS))  . insulin glargine, 2 Unit Dial, (TOUJEO MAX SOLOSTAR) 300 UNIT/ML Solostar Pen  Inject 24 units daily. Titrate as instructed. Max daily dose 40 units  . Insulin Pen Needle (PEN NEEDLES) 32G X 4 MM MISC 1 Units by Does not apply route at bedtime.  . lamoTRIgine (LAMICTAL) 200 MG tablet TAKE 2 TABLETS BY MOUTH AT BEDTIME (Patient taking differently: Take 400 mg by mouth at bedtime. )  . loperamide (IMODIUM) 2 MG capsule Take 1 capsule (2 mg total) by mouth as needed for diarrhea or loose stools.  . metFORMIN (GLUCOPHAGE) 1000 MG tablet TAKE 1 TABLET BY MOUTH TWICE DAILY WITH MEALS . APPOINTMENT REQUIRED FOR FUTURE REFILLS (Patient taking differently: Take 1,000 mg by mouth 2 (two) times daily with a meal. TAKE 1 TABLET BY MOUTH TWICE DAILY WITH MEALS . APPOINTMENT REQUIRED FOR FUTURE REFILLS)  . pantoprazole (PROTONIX) 40 MG tablet Take 1 tablet (40 mg total) by mouth 2 (two) times daily.  . pramipexole (MIRAPEX) 1 MG tablet Take 1 tablet (1 mg total) by mouth at bedtime.  . predniSONE (DELTASONE) 20 MG tablet Take 2 tablets (40 mg total) by mouth daily with breakfast.  . risperiDONE (RISPERDAL) 3 MG tablet TAKE 1 TABLET BY MOUTH EVERY DAY AT BEDTIME (Patient taking differently: Take 3 mg by mouth at bedtime. )  . sertraline (ZOLOFT) 100 MG tablet Take 1.5 tablets (150 mg total) by mouth daily.  . [DISCONTINUED] Multiple Vitamin (MULTIVITAMIN WITH MINERALS) TABS tablet Take 1 tablet by mouth daily. (Patient not taking: Reported on 05/06/2020)   No facility-administered encounter medications on file as of 05/06/2020.    Diagnostic Tests Reviewed/Disposition: see above; CT abdomen, colonoscopy, CT head, MRI brain  Consults: Gastroenterology  Discharge Instructions: Follow-up with PCP, follow-up ASAP with Dr. Alice Reichert.  Obtain CBC, CMP  Disease/illness Education: done; has been eating some soft pudding and soup  Home Health/Community Services Discussions/Referrals: Discharge with home health  Establishment or re-establishment of referral orders for community resources:  n/a  Discussion with other health care providers: n/a  Assessment and Support of treatment regimen adherence: completed  Appointments Coordinated with: patient  Education for self-management, independent living, and ADLs: completed  Allergies  Allergen Reactions  . Avelox [Moxifloxacin Hcl In Nacl] Anaphylaxis  . Bactrim [Sulfamethoxazole-Trimethoprim] Anaphylaxis  . Ciprofloxacin Other (See Comments)    Pt states she was told never to take this as it is in the same family as Avelox.   . Depakote [Divalproex Sodium]   . Imitrex [Sumatriptan] Other (See Comments)    Neck and shoulder pain  . Stadol [Butorphanol] Rash   Outpatient Encounter Medications as of 05/06/2020  Medication Sig  . ALPRAZolam (XANAX) 0.5 MG tablet Take 1 tablet (0.5 mg total) by mouth 4 (four) times daily as needed for anxiety.  [name] buPROPion (WELLBUTRIN XL) 150 MG 24 hr tablet Take 3 tablets (450 mg total) by  mouth daily.  . ferrous sulfate 325 (65 FE) MG EC tablet Take 1 tablet (325 mg total) by mouth 2 (two) times daily.  . furosemide (LASIX) 20 MG tablet Take 1 tablet (20 mg total) by mouth daily.  [name] glipiZIDE (GLUCOTROL) 5 MG tablet TAKE 1 TABLET BY MOUTH TWICE DAILY WITH MEALS (APPOINTMENT  REQUIRED  FOR  FUTURE  REFILLS) (Patient taking differently: Take 5 mg by mouth 2 (two) times daily before a meal. TAKE 1 TABLET BY MOUTH TWICE DAILY WITH MEALS (APPOINTMENT  REQUIRED  FOR  FUTURE  REFILLS))  . insulin glargine, 2 Unit Dial, (TOUJEO MAX SOLOSTAR) 300 UNIT/ML Solostar Pen Inject 24 units daily. Titrate as instructed. Max daily dose 40 units  . Insulin Pen Needle (PEN NEEDLES) 32G X 4 MM MISC 1 Units by Does not apply route at bedtime.  . lamoTRIgine (LAMICTAL) 200 MG tablet TAKE 2 TABLETS BY MOUTH AT BEDTIME (Patient taking differently: Take 400 mg by mouth at bedtime. )  . loperamide (IMODIUM) 2 MG capsule Take 1 capsule (2 mg total) by mouth as needed for diarrhea or loose stools.  . metFORMIN (GLUCOPHAGE)  1000 MG tablet TAKE 1 TABLET BY MOUTH TWICE DAILY WITH MEALS . APPOINTMENT REQUIRED FOR FUTURE REFILLS (Patient taking differently: Take 1,000 mg by mouth 2 (two) times daily with a meal. TAKE 1 TABLET BY MOUTH TWICE DAILY WITH MEALS . APPOINTMENT REQUIRED FOR FUTURE REFILLS)  . pantoprazole (PROTONIX) 40 MG tablet Take 1 tablet (40 mg total) by mouth 2 (two) times daily.  . pramipexole (MIRAPEX) 1 MG tablet Take 1 tablet (1 mg total) by mouth at bedtime.  . predniSONE (DELTASONE) 20 MG tablet Take 2 tablets (40 mg total) by mouth daily with breakfast.  . risperiDONE (RISPERDAL) 3 MG tablet TAKE 1 TABLET BY MOUTH EVERY DAY AT BEDTIME (Patient taking differently: Take 3 mg by mouth at bedtime. )  . sertraline (ZOLOFT) 100 MG tablet Take 1.5 tablets (150 mg total) by mouth daily.  . [DISCONTINUED] Multiple Vitamin (MULTIVITAMIN WITH MINERALS) TABS tablet Take 1 tablet by mouth daily. (Patient not taking: Reported on 05/06/2020)   No facility-administered encounter medications on file as of 05/06/2020.   Patient Active Problem List   Diagnosis Date Noted  . Abnormal CT scan, gastrointestinal tract   . BRBPR (bright red blood per rectum) 05/01/2020  . Dysarthria 04/25/2020  . Weakness 04/25/2020  . Diplopia 04/25/2020  . Pancolitis ([place]) 04/25/2020  . Type 2 diabetes mellitus ([place]) 04/25/2020  . Diarrhea 04/21/2020  . Left lower quadrant abdominal pain 04/21/2020  . Colitis 04/21/2020  . Severe sepsis ([place]) 04/21/2020  . AKI (acute kidney injury) ([place]) 04/21/2020  . Hypokalemia 04/21/2020  . Hyponatremia 04/21/2020  . Anemia 04/21/2020  . Major depression, chronic 06/01/2018  . OCD (obsessive compulsive disorder) 06/01/2018  . Chronic heart failure with preserved ejection fraction ([place]) 05/16/2018  . Left leg pain 11/04/2017  . Atypical chest pain 03/05/2017  . GERD (gastroesophageal reflux disease) 02/18/2017  . Iron deficiency anemia 10/18/2016  . Foot swelling 07/17/2016  .  Hypertension 03/21/2016  . Vitamin D deficiency 02/21/2016  . B12 deficiency 02/21/2016  . Incontinent of feces 02/21/2016  . Poorly controlled type 2 diabetes mellitus ([place]) 09/13/2015  . Dysuria 06/14/2015  . Migraines 05/23/2015  . Restless legs syndrome (RLS) 05/23/2015  . Recurrent UTI 04/26/2015  . Atrophic vaginitis 04/26/2015  . Dyspareunia, female 04/26/2015   Past Medical History:  Diagnosis Date  . Anemia   .  Anxiety   . Chronic kidney disease    UTI, hematuria in urine  . Colitis   . Crohn's disease ([place])   . Depression   . Diabetes ([place])   . Diverticulosis   . Frequent headaches   . Interstitial cystitis   . Recurrent UTI   . Restless leg syndrome   . Urinary frequency    Relevant past medical, surgical, family and social history reviewed and updated as indicated. Interim medical history since our last visit reviewed. Allergies and medications reviewed and updated.  Review of Systems  Constitutional: Positive for activity change, appetite change and fatigue. Negative for chills, diaphoresis, fever and unexpected weight change.  Respiratory: Negative.   Cardiovascular: Negative.   Gastrointestinal: Positive for abdominal pain and nausea. Negative for abdominal distention, blood in stool, constipation, diarrhea and vomiting.  Genitourinary: Negative.   Musculoskeletal: Negative.   Skin: Negative.   Neurological: Positive for weakness. Negative for dizziness, light-headedness, numbness and headaches.  Psychiatric/Behavioral: Negative.     Per HPI unless specifically indicated above     Objective:    BP 101/70 (BP Location: Right Arm, Patient Position: Sitting)   Pulse 96   Temp 98.7 F (37.1 C) (Oral)   Ht 5' 2"  (1.575 m)   Wt 120 lb (54.4 kg)   LMP  (LMP Unknown)   SpO2 98%   BMI 21.95 kg/m   Wt Readings from Last 3 Encounters:  05/06/20 120 lb (54.4 kg)  04/25/20 125 lb (56.7 kg)  04/22/20 124 lb 5.4 oz (56.4 kg)    Physical Exam Vitals and  nursing note reviewed.  Constitutional:      General: She is not in acute distress.    Appearance: Normal appearance. She is not toxic-appearing.  HENT:     Head: Normocephalic and atraumatic.  Eyes:     General: No scleral icterus.    Extraocular Movements: Extraocular movements intact.  Cardiovascular:     Rate and Rhythm: Normal rate and regular rhythm.     Heart sounds: Normal heart sounds. No murmur heard.   Pulmonary:     Effort: Pulmonary effort is normal. No respiratory distress.     Breath sounds: Normal breath sounds. No wheezing or rhonchi.  Abdominal:     General: Abdomen is flat. Bowel sounds are normal. There is no distension.     Palpations: Abdomen is soft. There is no mass.     Tenderness: There is abdominal tenderness.     Hernia: No hernia is present.  Skin:    General: Skin is warm and dry.     Capillary Refill: Capillary refill takes less than 2 seconds.     Coloration: Skin is pale. Skin is not jaundiced.     Findings: No bruising or erythema.  Neurological:     General: No focal deficit present.     Mental Status: She is alert and oriented to person, place, and time.     Motor: No weakness.     Gait: Gait normal.  Psychiatric:        Mood and Affect: Mood normal.        Behavior: Behavior normal.        Thought Content: Thought content normal.        Judgment: Judgment normal.       Assessment & Plan:   Problem List Items Addressed This Visit      Digestive   Colitis    Acute, ongoing.  Reports she has finished course of  antibiotics.  Has continued prednisone daily.  Follow-up scheduled with gastroenterologist 1 November.  Continue to slowly increase activity and continue easy to digest foods.  Follow-up in 4 weeks or sooner if needs arise.      Relevant Orders   CBC with Differential/Platelet (Completed)   Comprehensive metabolic panel (Completed)     Endocrine   Poorly controlled type 2 diabetes mellitus (HCC)    Chronic, ongoing.  A1c  late September 7.7%.  Is going to be complicated in future by treatment for Crohn's disease-has started daily prednisone.  Will need very close follow-up ongoing to carefully manage ongoing Crohn's disease with poorly controlled diabetes.  Follow-up in 4 weeks or sooner if needs arise.       Other Visit Diagnoses    Hospital discharge follow-up    -  Primary       Follow up plan: Return in about 4 weeks (around 06/03/2020).

## 2020-05-06 NOTE — Patient Instructions (Signed)
Visit Information  Goals Addressed              This Visit's Progress     RNCM: Pt- "Today my blood sugar was 465" (pt-stated)        CARE PLAN ENTRY (see longtitudinal plan of care for additional care plan information)  Objective:   Lab Results  Component Value Date   HGBA1C 7.7 (H) 04/21/2020      Lab Results  Component Value Date   CREATININE 0.51 05/03/2020   BUN <5 (L) 05/03/2020   NA 137 05/03/2020   K 3.0 (L) 05/03/2020   CL 104 05/03/2020   CO2 24 05/03/2020     No results found for: EGFR  Current Barriers:   Knowledge Deficits related to basic Diabetes pathophysiology and self care/management  Knowledge Deficits related to medications used for management of diabetes  Difficulty obtaining or cannot afford medications  Does not have glucometer to monitor blood sugar- has access to a meter to take blood sugar readings  Financial Constraints  Cognitive Deficits  Limited Social Support  Case Manager Clinical Goal(s):  Over the next 120 days, patient will demonstrate improved adherence to prescribed treatment plan for diabetes self care/management as evidenced by:   daily monitoring and recording of CBG   adherence to ADA/ carb modified diet  adherence to prescribed medication regimen  Interventions:   Provided education to patient about basic DM disease process  Reviewed medications with patient and discussed importance of medication adherence. 05-06-2020: The patient is taking prednisone right now so her blood sugars are a little elevated at 230.  Education and support.   Discussed plans with patient for ongoing care management follow up and provided patient with direct contact information for care management team  Provided patient with written educational materials related to hypo and hyperglycemia and importance of correct treatment. Sending information by my Chart and the EMMI system. 05-06-2020: the patient is doing better at managing her DM.  Praised for [employer].   Reviewed scheduled/upcoming provider appointments including: 05-06-2020 with pcp. Reminded the patient to take her paperwork with her from recent hospitalization and questions she has for the pcp.   Reviewed with the patient the effects uncontrolled diabetes can have on the eyes, kidneys, and other organ systems.   Advised patient, providing education and rationale, to check cbg 2 times or more daily and record, calling pcp for findings outside established parameters.  05-06-2020: The patient is checking blood sugars. Her sugars had been doing really good, she is having some higher readings right now but is on prednisone. The patient educated on prednisone causing blood sugars to be elevated. The patient had gotten her hemoglobin A1C down to 7.7. Praised for accomplishments.   Provided a calendar/record book for recording blood sugars and samples with coupons for Glucerna for the patient at the front desk. She will come by and pick up the book and Glucerna samples.   Referral for LCSW due to depression and help with coping mechanisms. Will alert the CCM pharmacist for help with the management of medications and new diagnosis of chron's disease.   Patient Self Care Activities:   UNABLE to independently manage diabetes. Hemoglobin A1C was >14 on 12-14-2019; Hemoglobin A1C on 04-21-2020 7.7%  Self administers oral medications as prescribed  Self administers insulin as prescribed  Checks blood sugars as prescribed and utilize hyper and hypoglycemia protocol as needed  Adheres to prescribed ADA/carb modified  Please see past updates related to this goal by  clicking on the "Past Updates" button in the selected goal        RNCM: Pt-"I have a way of checking my blood pressure" "I do not weigh daily" (pt-stated)        [place] (see longtitudinal plan of care for additional care plan information)  Current Barriers:   Chronic Disease Management support,  education, and care coordination needs related to CHF, HTN, Depression, with new diagnosis of Chron's and IBS with recent hospitalization in September of 2021  Clinical Goal(s) related to : CHF, HTN, Depression, with new diagnosis of Chron's and IBS with recent hospitalization in September of 2021 Over the next 120 days, patient will:   Work with the care management team to address educational, disease management, and care coordination needs   Begin or continue self health monitoring activities as directed today Measure and record blood pressure 2 times per week, Measure and record weight daily, and adhere to a heart healthy/ADA diet. Also to report changes related to Chron's and IBS.   Call provider office for new or worsened signs and symptoms Blood pressure findings outside established parameters, Weight outside established parameters, and New or worsened symptom related to depression or other chronic health conditions  Call care management team with questions or concerns  Verbalize basic understanding of patient centered plan of care established today  Interventions related to : CHF, HTN, Depression, with new diagnosis of Chron's and IBS with recent hospitalization in September of 2021  Evaluation of current treatment plans and patient's adherence to plan as established by provider.  05-06-2020: The patient has a post hospitalization visit with the pcp today. She is concerned about her recent hospitalization. She has a new diagnosis of Chron's and IBS. She is doing well post hospitalization and says the medication she is on is helping a lot. She is experiencing elevated blood sugars due to the prednisone she is taking. Education on taking discharge paperwork with her to appointment today and follow recommendations of the provider.   Assessed patient understanding of disease states.  05-06-2020: The patient feels like she has some answers now. She does not know how long she will be out of work  but she wants to manage her health and well being. The patient also spoke to the LCSW today to help with management of her depression and the impact of her new diagnosis.  She is thankful for the CCM support and actually reached out to the [facility] asking for assistance.   Assessed patient's education and care coordination needs.  Education on the benefits of effective management of chronic conditions. The importance of daily weights even with preserved heart failure. The patient does have a way of checking her blood pressure and will start doing this on a consistent basis.   Provided disease specific education to patient. Educational information sent to the patient by my Chart and EMMI on chronic conditions and effective management. 05-06-2020: Will also send new information on Chron;s and IBS.   Collaborated with appropriate clinical care team members regarding patient needs.  Pharm D spoke to the patient today. A referral made to the LCSW to assist with depression. The patient denies any suicidal ideation/harm of self or others. The RNCM was concerned since she has been off of her medications of  changes in mood/mental status. 05-06-2020: The LCSW is working with the patient. Will ask the pharmacist to follow up with the patient since she has a new diagnosis.   Patient Self Care Activities related  to : CHF, HTN, Depression, with new diagnosis of Chron's and IBS with recent hospitalization in September of 2021  Patient is unable to independently self-manage chronic health conditions  Please see past updates related to this goal by clicking on the "Past Updates" button in the selected goal         Patient verbalizes understanding of instructions provided today.   Telephone follow up appointment with care management team member scheduled for: 06-21-2020 at 4:15 pm  [place], MSN, [facility] Mobile:  [phone]

## 2020-05-06 NOTE — Chronic Care Management (AMB) (Signed)
Care Management   Follow Up Note   05/06/2020 Name: Amy Hansen MRN: [id number] DOB: 10-11-1955  Referred by: Volney American, PA-C Reason for referral : Amy Hansen is a 64 y.o. year old female who is a primary care patient of Volney American, [place]. The care management team was consulted for assistance with care management and care coordination needs.    Review of patient status, including review of consultants reports, relevant laboratory and other test results, and collaboration with appropriate care team members and the patient's provider was performed as part of comprehensive patient evaluation and provision of chronic care management services.    SDOH (Social Determinants of Health) assessments performed: Yes See Care Plan activities for detailed interventions related to [facility])     Advanced Directives: See Care Plan and Vynca application for related entries.   Goals Addressed    . SW-Matintain My Quality of Life       Follow Up Date- 80 days from 05/06/20   - check out options for in-home help, long-term care or hospice - complete a living will - discuss my treatment options with the doctor or nurse - do one enjoyable thing every day - do something different, like talking to a new person or going to a new place, every day - learn something new by asking, reading and searching the Internet every day - make an audio or video recording for my loved ones - make shared treatment decisions with doctor - meditate daily - name a health care proxy (decision maker) - share memories using a picture album or scrapbook with my loved ones - spend time with a child every day, borrow one if I have to - spend time outdoors at least 3 times a week - strengthen or fix relationships with loved ones    Why is this important?   Having a long-term illness can be scary.  It can also be stressful for you and your caregiver.  These steps may help.      Notes: Patient has had several recent hospital admissions. She was admitted for a GI bleed and was diagnosed with Colitis and Crohn's disease. Patient wishes for RNCM involvement to provide education and material on new disorders. LCSW will place new referral.     . SW-Protect My Health       Follow Up Date 90 days from 05/06/20   - schedule appointment for flu shot - schedule appointment for vaccines needed due to my age or health - schedule recommended health tests (blood work, mammogram, colonoscopy, pap test) - schedule and keep appointment for annual check-up    Why is this important?   Screening tests can find diseases early when they are easier to treat.  Your doctor or nurse will talk with you about which tests are important for you.  Getting shots for common diseases like the flu and shingles will help prevent them.     Notes: Patient's caregiver is spouse. She reports that he is a great support to her. She states that she was having difficulty walking two weeks ago but is now able to walk again without her walker. Patient is using her deceased mother's medical equipment for mobility assistance.     [name] SW-Track and Manage My Symptoms       Follow Up Date 90 days from 05/06/20   - avoid negative self-talk - develop a personal safety plan - develop a plan to deal with triggers like  holidays, anniversaries - exercise at least 2 to 3 times per week - have a plan for how to handle bad days - journal feelings and what helps to feel better or worse - spend time or talk with others at least 2 to 3 times per week - spend time or talk with others every day - watch for early signs of feeling worse - write in journal every day    Why is this important?   Keeping track of your progress will help your treatment team find the right mix of medicine and therapy for you.  Write in your journal every day.  Day-to-day changes in depression symptoms are normal. It may be more helpful to check  your progress at the end of each week instead of every day.     Notes: Patient has a long term mental health provider that she sees regular. Patient sees Amy Hansen at [facility] in [place], [place]. Patient is currently on several medications to treat her anxiety/depression which include: Lamictal, Wellbutrin, Zoloft, Risperdal and Xanax.      The care management team will reach out to the patient again over the next 90 days.   Eula Fried, BSW, MSW, [facility] Care Management [email or web address] Phone: [phone]

## 2020-05-06 NOTE — Chronic Care Management (AMB) (Signed)
Care Management   Follow Up Note   05/06/2020 Name: Amy Hansen MRN: [id number] DOB: 12/27/1955  Referred by: Volney American, PA-C Reason for referral : Care Coordination (RNCM: Patient called and left a VM, returning patient call: Chronic Disease Management and Care Coordination Needs)   Amy Hansen is a 64 y.o. year old female who is a primary care patient of Volney American, [place]. The care management team was consulted for assistance with care management and care coordination needs.    Review of patient status, including review of consultants reports, relevant laboratory and other test results, and collaboration with appropriate care team members and the patient's provider was performed as part of comprehensive patient evaluation and provision of chronic care management services.    SDOH (Social Determinants of Health) assessments performed: Yes See Care Plan activities for detailed interventions related to [name])     Advanced Directives: See Care Plan and Vynca application for related entries.   Goals Addressed              This Visit's Progress   .  RNCM: Pt- "Today my blood sugar was 465" (pt-stated)        CARE PLAN ENTRY (see longtitudinal plan of care for additional care plan information)  Objective:   Lab Results  Component Value Date   HGBA1C 7.7 (H) 04/21/2020      Lab Results  Component Value Date   CREATININE 0.51 05/03/2020   BUN <5 (L) 05/03/2020   NA 137 05/03/2020   K 3.0 (L) 05/03/2020   CL 104 05/03/2020   CO2 24 05/03/2020    . No results found for: EGFR  Current Barriers:  [name] Knowledge Deficits related to basic Diabetes pathophysiology and self care/management . Knowledge Deficits related to medications used for management of diabetes . Difficulty obtaining or cannot afford medications . Does not have glucometer to monitor blood sugar- has access to a meter to take blood sugar readings . Film/video editor . Cognitive  Deficits . Limited Social Support  Case Manager Clinical Goal(s):  Over the next 120 days, patient will demonstrate improved adherence to prescribed treatment plan for diabetes self care/management as evidenced by:  . daily monitoring and recording of CBG  . adherence to ADA/ carb modified diet . adherence to prescribed medication regimen  Interventions:  . Provided education to patient about basic DM disease process . Reviewed medications with patient and discussed importance of medication adherence. 05-06-2020: The patient is taking prednisone right now so her blood sugars are a little elevated at 230.  Education and support.  . Discussed plans with patient for ongoing care management follow up and provided patient with direct contact information for care management team . Provided patient with written educational materials related to hypo and hyperglycemia and importance of correct treatment. Sending information by my Chart and the EMMI system. 05-06-2020: the patient is doing better at managing her DM. Praised for [employer].  . Reviewed scheduled/upcoming provider appointments including: 05-06-2020 with pcp. Reminded the patient to take her paperwork with her from recent hospitalization and questions she has for the pcp.  . Reviewed with the patient the effects uncontrolled diabetes can have on the eyes, kidneys, and other organ systems.  . Advised patient, providing education and rationale, to check cbg 2 times or more daily and record, calling pcp for findings outside established parameters.  05-06-2020: The patient is checking blood sugars. Her sugars had been doing really good, she is having some  higher readings right now but is on prednisone. The patient educated on prednisone causing blood sugars to be elevated. The patient had gotten her hemoglobin A1C down to 7.7. Praised for accomplishments.  . Provided a calendar/record book for recording blood sugars and samples with coupons for  Glucerna for the patient at the front desk. She will come by and pick up the book and Glucerna samples.  . Referral for LCSW due to depression and help with coping mechanisms. Will alert the CCM pharmacist for help with the management of medications and new diagnosis of chron's disease.   Patient Self Care Activities:  . UNABLE to independently manage diabetes. Hemoglobin A1C was >14 on 12-14-2019; Hemoglobin A1C on 04-21-2020 7.7% . Self administers oral medications as prescribed . Self administers insulin as prescribed . Checks blood sugars as prescribed and utilize hyper and hypoglycemia protocol as needed . Adheres to prescribed ADA/carb modified  Please see past updates related to this goal by clicking on the "Past Updates" button in the selected goal      .  RNCM: Pt-"I have a way of checking my blood pressure" "I do not weigh daily" (pt-stated)        [place] (see longtitudinal plan of care for additional care plan information)  Current Barriers:  . Chronic Disease Management support, education, and care coordination needs related to CHF, HTN, Depression, with new diagnosis of Chron's and IBS with recent hospitalization in September of 2021  Clinical Goal(s) related to : CHF, HTN, Depression, with new diagnosis of Chron's and IBS with recent hospitalization in September of 2021 Over the next 120 days, patient will:  . Work with the care management team to address educational, disease management, and care coordination needs  . Begin or continue self health monitoring activities as directed today Measure and record blood pressure 2 times per week, Measure and record weight daily, and adhere to a heart healthy/ADA diet. Also to report changes related to Chron's and IBS.  . Call provider office for new or worsened signs and symptoms Blood pressure findings outside established parameters, Weight outside established parameters, and New or worsened symptom related to depression or other  chronic health conditions . Call care management team with questions or concerns . Verbalize basic understanding of patient centered plan of care established today  Interventions related to : CHF, HTN, Depression, with new diagnosis of Chron's and IBS with recent hospitalization in September of 2021 . Evaluation of current treatment plans and patient's adherence to plan as established by provider.  05-06-2020: The patient has a post hospitalization visit with the pcp today. She is concerned about her recent hospitalization. She has a new diagnosis of Chron's and IBS. She is doing well post hospitalization and says the medication she is on is helping a lot. She is experiencing elevated blood sugars due to the prednisone she is taking. Education on taking discharge paperwork with her to appointment today and follow recommendations of the provider.  . Assessed patient understanding of disease states.  05-06-2020: The patient feels like she has some answers now. She does not know how long she will be out of work but she wants to manage her health and well being. The patient also spoke to the LCSW today to help with management of her depression and the impact of her new diagnosis.  She is thankful for the CCM support and actually reached out to the [facility] asking for assistance.  . Assessed patient's education and care coordination needs.  Education on the benefits of effective management of chronic conditions. The importance of daily weights even with preserved heart failure. The patient does have a way of checking her blood pressure and will start doing this on a consistent basis.  . Provided disease specific education to patient. Educational information sent to the patient by my Chart and EMMI on chronic conditions and effective management. 05-06-2020: Will also send new information on Chron;s and IBS.  [name] Collaborated with appropriate clinical care team members regarding patient needs.  Pharm D spoke to the patient  today. A referral made to the LCSW to assist with depression. The patient denies any suicidal ideation/harm of self or others. The RNCM was concerned since she has been off of her medications of  changes in mood/mental status. 05-06-2020: The LCSW is working with the patient. Will ask the pharmacist to follow up with the patient since she has a new diagnosis.   Patient Self Care Activities related to : CHF, HTN, Depression, with new diagnosis of Chron's and IBS with recent hospitalization in September of 2021 . Patient is unable to independently self-manage chronic health conditions  Please see past updates related to this goal by clicking on the "Past Updates" button in the selected goal          Telephone follow up appointment with care management team member scheduled for: 06-21-2020 at 4:15 pm  [place], MSN, [facility] Mobile: [phone]

## 2020-05-07 LAB — CBC WITH DIFFERENTIAL/PLATELET
Basophils Absolute: 0 10*3/uL (ref 0.0–0.2)
Basos: 0 %
EOS (ABSOLUTE): 0 10*3/uL (ref 0.0–0.4)
Eos: 0 %
Hematocrit: 32.4 % — ABNORMAL LOW (ref 34.0–46.6)
Hemoglobin: 10.1 g/dL — ABNORMAL LOW (ref 11.1–15.9)
Immature Grans (Abs): 0.1 10*3/uL (ref 0.0–0.1)
Immature Granulocytes: 1 %
Lymphocytes Absolute: 0.9 10*3/uL (ref 0.7–3.1)
Lymphs: 12 %
MCH: 23.8 pg — ABNORMAL LOW (ref 26.6–33.0)
MCHC: 31.2 g/dL — ABNORMAL LOW (ref 31.5–35.7)
MCV: 76 fL — ABNORMAL LOW (ref 79–97)
Monocytes Absolute: 0.1 10*3/uL (ref 0.1–0.9)
Monocytes: 2 %
Neutrophils Absolute: 6.6 10*3/uL (ref 1.4–7.0)
Neutrophils: 85 %
Platelets: 604 10*3/uL — ABNORMAL HIGH (ref 150–450)
RBC: 4.24 x10E6/uL (ref 3.77–5.28)
RDW: 24.8 % — ABNORMAL HIGH (ref 11.7–15.4)
WBC: 7.7 10*3/uL (ref 3.4–10.8)

## 2020-05-07 LAB — COMPREHENSIVE METABOLIC PANEL
ALT: 15 IU/L (ref 0–32)
AST: 18 IU/L (ref 0–40)
Albumin/Globulin Ratio: 1 — ABNORMAL LOW (ref 1.2–2.2)
Albumin: 2.8 g/dL — ABNORMAL LOW (ref 3.8–4.8)
Alkaline Phosphatase: 143 IU/L — ABNORMAL HIGH (ref 44–121)
BUN/Creatinine Ratio: 10 — ABNORMAL LOW (ref 12–28)
BUN: 6 mg/dL — ABNORMAL LOW (ref 8–27)
Bilirubin Total: <0.2 mg/dL (ref 0.0–1.2)
CO2: 24 mmol/L (ref 20–29)
Calcium: 8.4 mg/dL — ABNORMAL LOW (ref 8.7–10.3)
Chloride: 97 mmol/L (ref 96–106)
Creatinine, Ser: 0.58 mg/dL (ref 0.57–1.00)
GFR calc Af Amer: 114 mL/min/{1.73_m2} (ref >59)
GFR calc non Af Amer: 99 mL/min/{1.73_m2} (ref >59)
Globulin, Total: 2.7 g/dL (ref 1.5–4.5)
Glucose: 184 mg/dL — ABNORMAL HIGH (ref 65–99)
Potassium: 4.3 mmol/L (ref 3.5–5.2)
Sodium: 136 mmol/L (ref 134–144)
Total Protein: 5.5 g/dL — ABNORMAL LOW (ref 6.0–8.5)

## 2020-05-07 NOTE — Assessment & Plan Note (Signed)
Chronic, ongoing.  A1c late September 7.7%.  Is going to be complicated in future by treatment for Crohn's disease-has started daily prednisone.  Will need very close follow-up ongoing to carefully manage ongoing Crohn's disease with poorly controlled diabetes.  Follow-up in 4 weeks or sooner if needs arise.

## 2020-05-07 NOTE — Assessment & Plan Note (Signed)
>>  ASSESSMENT AND PLAN FOR DIABETES MELLITUS TYPE 2 IN NONOBESE (HCC) WRITTEN ON 05/07/2020  7:48 AM BY MARTINEZ, JESSICA A, NP  Chronic, ongoing.  A1c late September 7.7%.  Is going to be complicated in future by treatment for Crohn's disease-has started daily prednisone.  Will need very close follow-up ongoing to carefully manage ongoing Crohn's disease with poorly controlled diabetes.  Follow-up in 4 weeks or sooner if needs arise.

## 2020-05-07 NOTE — Assessment & Plan Note (Signed)
Acute, ongoing.  Reports she has finished course of antibiotics.  Has continued prednisone daily.  Follow-up scheduled with gastroenterologist 1 November.  Continue to slowly increase activity and continue easy to digest foods.  Follow-up in 4 weeks or sooner if needs arise.

## 2020-05-09 NOTE — Telephone Encounter (Signed)
Spoke with patient regarding her pathology results, see 05/03/20 pathology result note.

## 2020-05-10 ENCOUNTER — Encounter: Payer: Self-pay | Admitting: Nurse Practitioner

## 2020-05-13 ENCOUNTER — Other Ambulatory Visit: Payer: Self-pay | Admitting: Family Medicine

## 2020-05-13 ENCOUNTER — Encounter: Payer: Self-pay | Admitting: Nurse Practitioner

## 2020-05-13 MED ORDER — PRAMIPEXOLE DIHYDROCHLORIDE 1 MG PO TABS
1.0000 mg | ORAL_TABLET | Freq: Every day | ORAL | 1 refills | Status: DC
Start: 2020-05-13 — End: 2020-05-16

## 2020-05-13 NOTE — Telephone Encounter (Signed)
Requested medication (s) are due for refill today:yes  Requested medication (s) are on the active medication list: yes  Last refill: 12/20/19  #90  0 refills  Future visit scheduled: no  Notes to clinic:   received High warning that this dose exceeds recommended daily dose.    Requested Prescriptions  Pending Prescriptions Disp Refills   pramipexole (MIRAPEX) 1 MG tablet 90 tablet 1    Sig: Take 1 tablet (1 mg total) by mouth at bedtime.      Neurology:  Parkinsonian Agents Passed - 05/13/2020  9:33 AM      Passed - Last BP in normal range    BP Readings from Last 1 Encounters:  05/06/20 101/70          Passed - Valid encounter within last 12 months    Recent Outpatient Visits           1 week ago Hospital discharge follow-up   [facility] Eulogio Bear, NP   3 weeks ago Poorly controlled type 2 diabetes mellitus ([place])   [facility] Eulogio Bear, NP   4 months ago Poorly controlled type 2 diabetes mellitus [facility])   [facility] Volney American, [place]   5 months ago [place], [place], [place]   5 months ago [place], [place], [place]

## 2020-05-13 NOTE — Telephone Encounter (Signed)
Do one of you mind looking into this refill if you get a chance pleae?

## 2020-05-13 NOTE — Telephone Encounter (Signed)
Pt scheduled for 06/10/2020 4 week f.u

## 2020-05-13 NOTE — Telephone Encounter (Signed)
Looks like she takes for RLS as needed when I read through notes and cleaned up her problem list.  I will send refill.  She does need 4 week follow-up though as stated in [place] recent note, can we get that scheduled please.

## 2020-05-13 NOTE — Telephone Encounter (Signed)
Medication Refill - Medication: Mirapex   Has the patient contacted their pharmacy? Yes.   Pt states that she is completely out as of today. Please advise. (Agent: If no, request that the patient contact the pharmacy for the refill.) (Agent: If yes, when and what did the pharmacy advise?)  Preferred Pharmacy (with phone number or street name):  [place], [place] - [place]  [address] [place] [address]  Phone: [phone] Fax: [phone]  Hours: Not open 24 hours     Agent: Please be advised that RX refills may take up to 3 business days. We ask that you follow-up with your pharmacy.

## 2020-05-16 ENCOUNTER — Other Ambulatory Visit: Payer: Self-pay

## 2020-05-16 MED ORDER — PRAMIPEXOLE DIHYDROCHLORIDE 1 MG PO TABS
1.0000 mg | ORAL_TABLET | Freq: Every day | ORAL | 1 refills | Status: DC
Start: 2020-05-16 — End: 2020-11-16

## 2020-05-16 NOTE — Telephone Encounter (Signed)
Copied from [place] [id number]. Topic: General - Other >> May 13, 2020  5:13 PM Rainey Pines A wrote: Patient stated that prescription is still not at pharmacy. Checked status prescription on our end and it states that is it in progress due to escribe being down today. Please advise

## 2020-05-16 NOTE — Telephone Encounter (Signed)
Pt presented in office stating that she is unable to go back to work, she states that she has tried and she's just too weak. Please advise.

## 2020-05-16 NOTE — Telephone Encounter (Signed)
RX did not go through as e-prescribing was down. Can we resend please?

## 2020-05-18 ENCOUNTER — Encounter: Payer: Self-pay | Admitting: Physician Assistant

## 2020-05-18 ENCOUNTER — Telehealth: Payer: Self-pay | Admitting: Physician Assistant

## 2020-05-18 ENCOUNTER — Other Ambulatory Visit: Payer: Self-pay

## 2020-05-18 ENCOUNTER — Ambulatory Visit (INDEPENDENT_AMBULATORY_CARE_PROVIDER_SITE_OTHER): Payer: Self-pay | Admitting: Physician Assistant

## 2020-05-18 VITALS — BP 100/65 | HR 89 | Temp 98.2°F | Ht 62.0 in | Wt 125.8 lb

## 2020-05-18 DIAGNOSIS — N390 Urinary tract infection, site not specified: Secondary | ICD-10-CM

## 2020-05-18 MED ORDER — NITROFURANTOIN MONOHYD MACRO 100 MG PO CAPS: 100.0000 mg | ORAL_CAPSULE | Freq: Two times a day (BID) | ORAL | 0 refills | Status: AC

## 2020-05-18 NOTE — Telephone Encounter (Signed)
Patient called the office this morning with complaint of possible UTI.  Her symptoms include: coudy urine, stinging with urination, nauseous, dry heaves.  I added her to Sam's schedule for this afternoon at 2pm.

## 2020-05-19 LAB — URINALYSIS, COMPLETE
Bilirubin, UA: NEGATIVE
Leukocytes,UA: NEGATIVE
Nitrite, UA: NEGATIVE
Protein,UA: NEGATIVE
RBC, UA: NEGATIVE
Specific Gravity, UA: 1.015 (ref 1.005–1.030)
Urobilinogen, Ur: 0.2 mg/dL (ref 0.2–1.0)
pH, UA: 6.5 (ref 5.0–7.5)

## 2020-05-19 LAB — MICROSCOPIC EXAMINATION

## 2020-05-19 NOTE — Progress Notes (Signed)
05/18/2020 3:27 PM   Amy Hansen 1955-10-11 [id number]  CC: Chief Complaint  Patient presents with  . Urinary Tract Infection    HPI: Amy Hansen is a 64 y.o. female with PMH recurrent UTI on daily suppressive Macrobid, IC, and hematuria who presents today for evaluation of possible UTI.   Today she reports an approximate 3-day history of dysuria, lower abdominal pain, nausea, and dry heaves.  She had cloudy urine this morning.  She denies fever and chills.  She took her daily suppressive Macrobid this morning.  She was recently hospitalized with Enterobacter UTI and treated with 2 doses of fosfomycin.  Urine culture with cefazolin, ceftriaxone, and Pepto-Bismol resistant Enterobacter with intermediate susceptibility to Macrobid.  She is allergic to Bactrim and Cipro.  In-office UA today positive for trace glucose and trace ketones; urine microscopy with moderate bacteria.  PMH: Past Medical History:  Diagnosis Date  . Anemia   . Anxiety   . Chronic kidney disease    UTI, hematuria in urine  . Colitis   . Crohn's disease ([place])   . Depression   . Diabetes ([place])   . Diverticulosis   . Frequent headaches   . Interstitial cystitis   . Recurrent UTI   . Restless leg syndrome   . Urinary frequency     Surgical History: Past Surgical History:  Procedure Laterality Date  . ABDOMINAL HYSTERECTOMY    . bariatric bypass  2012  . BIOPSY  05/03/2020   Procedure: BIOPSY;  Surgeon: Milus Banister, MD;  Location: [facility] ENDOSCOPY;  Service: Endoscopy;;  . CARPAL TUNNEL RELEASE Right 2003  . CARPAL TUNNEL RELEASE Right    2008  . CHOLECYSTECTOMY  1975  . COLONOSCOPY WITH PROPOFOL N/A 05/03/2020   Procedure: COLONOSCOPY WITH PROPOFOL;  Surgeon: Milus Banister, MD;  Location: [facility] ENDOSCOPY;  Service: Endoscopy;  Laterality: N/A;  . CYSTOSCOPY W/ RETROGRADES Bilateral 06/06/2015   Procedure: CYSTOSCOPY WITH RETROGRADE PYELOGRAM;  Surgeon: Festus Aloe, MD;  Location: [facility]  ORS;  Service: Urology;  Laterality: Bilateral;  . FL INJ LEFT KNEE CT ARTHROGRAM ([facility] HX) Left    1995  . GASTRIC BYPASS  2010  . [place]  2013  . KNEE ARTHROSCOPY Left 1996  . TONSILLECTOMY      Home Medications:  Allergies as of 05/18/2020      Reactions   Avelox [moxifloxacin Hcl In Nacl] Anaphylaxis   Bactrim [sulfamethoxazole-trimethoprim] Anaphylaxis   Ciprofloxacin Other (See Comments)   Pt states she was told never to take this as it is in the same family as Avelox.    Depakote [divalproex Sodium]    Imitrex [sumatriptan] Other (See Comments)   Neck and shoulder pain   Stadol [butorphanol] Rash      Medication List       Accurate as of May 18, 2020 11:59 PM. If you have any questions, ask your nurse or doctor.        ALPRAZolam 0.5 MG tablet Commonly known as: XANAX Take 1 tablet (0.5 mg total) by mouth 4 (four) times daily as needed for anxiety.   buPROPion 150 MG 24 hr tablet Commonly known as: WELLBUTRIN XL Take 3 tablets (450 mg total) by mouth daily.   ferrous sulfate 325 (65 FE) MG EC tablet Take 1 tablet (325 mg total) by mouth 2 (two) times daily.   furosemide 20 MG tablet Commonly known as: LASIX Take 1 tablet (20 mg total) by mouth daily.   glipiZIDE 5  MG tablet Commonly known as: GLUCOTROL TAKE 1 TABLET BY MOUTH TWICE DAILY WITH MEALS (APPOINTMENT  REQUIRED  FOR  FUTURE  REFILLS) What changed:   how much to take  how to take this  when to take this   lamoTRIgine 200 MG tablet Commonly known as: LAMICTAL TAKE 2 TABLETS BY MOUTH AT BEDTIME   loperamide 2 MG capsule Commonly known as: IMODIUM Take 1 capsule (2 mg total) by mouth as needed for diarrhea or loose stools.   metFORMIN 1000 MG tablet Commonly known as: GLUCOPHAGE TAKE 1 TABLET BY MOUTH TWICE DAILY WITH MEALS . APPOINTMENT REQUIRED FOR FUTURE REFILLS What changed:   how much to take  how to take this  when to take this   nitrofurantoin  (macrocrystal-monohydrate) 100 MG capsule Commonly known as: MACROBID Take 1 capsule (100 mg total) by mouth every 12 (twelve) hours for 10 days. Started by: Debroah Loop, PA-C   pantoprazole 40 MG tablet Commonly known as: PROTONIX Take 1 tablet (40 mg total) by mouth 2 (two) times daily.   Pen Needles 32G X 4 MM Misc 1 Units by Does not apply route at bedtime.   pramipexole 1 MG tablet Commonly known as: MIRAPEX Take 1 tablet (1 mg total) by mouth at bedtime.   predniSONE 20 MG tablet Commonly known as: DELTASONE Take 2 tablets (40 mg total) by mouth daily with breakfast.   risperiDONE 3 MG tablet Commonly known as: RISPERDAL TAKE 1 TABLET BY MOUTH EVERY DAY AT BEDTIME   sertraline 100 MG tablet Commonly known as: ZOLOFT Take 1.5 tablets (150 mg total) by mouth daily.   Toujeo Max SoloStar 300 UNIT/ML Solostar Pen Generic drug: insulin glargine (2 Unit Dial) Inject 24 units daily. Titrate as instructed. Max daily dose 40 units       Allergies:  Allergies  Allergen Reactions  . Avelox [Moxifloxacin Hcl In Nacl] Anaphylaxis  . Bactrim [Sulfamethoxazole-Trimethoprim] Anaphylaxis  . Ciprofloxacin Other (See Comments)    Pt states she was told never to take this as it is in the same family as Avelox.   . Depakote [Divalproex Sodium]   . Imitrex [Sumatriptan] Other (See Comments)    Neck and shoulder pain  . Stadol [Butorphanol] Rash    Family History: Family History  Problem Relation Age of Onset  . Stroke Father   . Colon cancer Mother   . Heart failure Sister   . Bladder Cancer Neg Hx   . Kidney disease Neg Hx   . Prostate cancer Neg Hx   . Kidney cancer Neg Hx     Social History:   reports that she quit smoking about 45 years ago. Her smoking use included cigarettes. She smoked 0.50 packs per day. She has never used smokeless tobacco. She reports that she does not drink alcohol and does not use drugs.  Physical Exam: BP 100/65   Pulse 89    Temp 98.2 F (36.8 C)   Ht 5' 2"  (1.575 m)   Wt 125 lb 12.8 oz (57.1 kg)   LMP  (LMP Unknown)   BMI 23.01 kg/m   Constitutional:  Alert and oriented, no acute distress, nontoxic appearing HEENT: [place], AT Cardiovascular: No clubbing, cyanosis, or edema Respiratory: Normal respiratory effort, no increased work of breathing Skin: No rashes, bruises or suspicious lesions Neurologic: Grossly intact, no focal deficits, moving all 4 extremities Psychiatric: Normal mood and affect  Laboratory Data: Results for orders placed or performed in visit on 05/18/20  Microscopic Examination  Urine  Result Value Ref Range   WBC, UA 0-5 0 - 5 /hpf   RBC 0-2 0 - 2 /hpf   Epithelial Cells (non renal) 0-10 0 - 10 /hpf   Renal Epithel, UA 0-10 (A) None seen /hpf   Bacteria, UA Moderate (A) None seen/Few  Urinalysis, Complete  Result Value Ref Range   Specific Gravity, UA 1.015 1.005 - 1.030   pH, UA 6.5 5.0 - 7.5   Color, UA Yellow Yellow   Appearance Ur Hazy (A) Clear   Leukocytes,UA Negative Negative   Protein,UA Negative Negative/Trace   Glucose, UA Trace (A) Negative   Ketones, UA Trace (A) Negative   RBC, UA Negative Negative   Bilirubin, UA Negative Negative   Urobilinogen, Ur 0.2 0.2 - 1.0 mg/dL   Nitrite, UA Negative Negative   Microscopic Examination See below:    Assessment & Plan:   1. Recurrent UTI History of recurrent UTI and acute presentation of irritative voiding symptoms.  UA is benign today, unclear if this is secondary to suppressive Macrobid use.  Per chart review, her most recent urine culture was resistant or she reports allergies to all tested oral antibiotics.  Recommend starting an empiric treatment course of Macrobid and sending urine for culture for further evaluation. - Urinalysis, Complete - CULTURE, URINE COMPREHENSIVE - nitrofurantoin, macrocrystal-monohydrate, (MACROBID) 100 MG capsule; Take 1 capsule (100 mg total) by mouth every 12 (twelve) hours for 10 days.   Dispense: 20 capsule; Refill: 0  Return for Will call with results.  Debroah Loop, PA-C  [facility] [address] [place], [place] [address] [phone]

## 2020-05-21 LAB — CULTURE, URINE COMPREHENSIVE

## 2020-05-23 ENCOUNTER — Telehealth: Payer: Self-pay | Admitting: Physician Assistant

## 2020-05-23 MED ORDER — DOXYCYCLINE HYCLATE 100 MG PO CAPS: 100.0000 mg | ORAL_CAPSULE | Freq: Two times a day (BID) | ORAL | 0 refills | Status: AC

## 2020-05-23 NOTE — Telephone Encounter (Signed)
Please contact the patient and inform her that I would like to switch her prescribed antibiotics based on her urine culture results.  I have sent a prescription for doxycycline 100 mg twice daily x7 days to the [facility] on [address].  She should start this ASAP and may resume suppressive Macrobid upon completion.

## 2020-05-23 NOTE — Telephone Encounter (Signed)
Notified patient as instructed, patient pleased. Discussed follow-up appointments, patient agrees  

## 2020-05-25 MED ORDER — FLUCONAZOLE 150 MG PO TABS: 150.0000 mg | ORAL_TABLET | Freq: Once | ORAL | 0 refills | Status: AC

## 2020-05-25 NOTE — Telephone Encounter (Signed)
Patient called and left a vmail, she believes she has a yeast infection due to abx. And would like medication sent in to her pharmacy if possible

## 2020-05-25 NOTE — Telephone Encounter (Signed)
Which she prefer a prescription for Diflucan or a vaginal suppository for her yeast infection?

## 2020-05-25 NOTE — Telephone Encounter (Signed)
Spoke to patient and she is requesting Diflucan

## 2020-05-25 NOTE — Addendum Note (Signed)
Addended by: Kyra Manges on: 05/25/2020 04:47 PM   Modules accepted: Orders

## 2020-05-25 NOTE — Telephone Encounter (Signed)
Okay.  We can send in a script for Diflucan.

## 2020-05-26 ENCOUNTER — Ambulatory Visit (INDEPENDENT_AMBULATORY_CARE_PROVIDER_SITE_OTHER): Payer: Self-pay | Admitting: Nurse Practitioner

## 2020-05-26 ENCOUNTER — Encounter: Payer: Self-pay | Admitting: Nurse Practitioner

## 2020-05-26 ENCOUNTER — Other Ambulatory Visit: Payer: Self-pay

## 2020-05-26 VITALS — BP 111/71 | HR 104 | Temp 98.7°F | Wt 134.2 lb

## 2020-05-26 DIAGNOSIS — M7989 Other specified soft tissue disorders: Secondary | ICD-10-CM

## 2020-05-26 DIAGNOSIS — E1165 Type 2 diabetes mellitus with hyperglycemia: Secondary | ICD-10-CM

## 2020-05-26 DIAGNOSIS — K529 Noninfective gastroenteritis and colitis, unspecified: Secondary | ICD-10-CM

## 2020-05-26 NOTE — Assessment & Plan Note (Signed)
Acute, ongoing. Was seen by cardiology last [place] likely needs close follow-up with them after everything is straightened with gastroenterologist. For now, will increase Lasix to twice daily for the next 3 days. BNP, BMP, and CBC checked today. Return to clinic in 2 weeks for follow-up.

## 2020-05-26 NOTE — Assessment & Plan Note (Signed)
>>  ASSESSMENT AND PLAN FOR DIABETES MELLITUS TYPE 2 IN NONOBESE (HCC) WRITTEN ON 05/26/2020 10:42 AM BY MARTINEZ, JESSICA A, NP  Chronic, ongoing. A1c in September 7.7%, next A1c will be due in December. With some low blood sugars, encouraged patient to routinely check blood sugar 3 times daily: Fasting, 2 hours after biggest meal, and before bed. If blood sugar is lower than 150 at bedtime, encouraged to eat high-protein snack. Bring this log to next appointment. Most likely complicated by daily prednisone for Crohn's disease-we'll continue to need close follow-up.

## 2020-05-26 NOTE — Assessment & Plan Note (Signed)
Chronic, ongoing. A1c in September 7.7%, next A1c will be due in December. With some low blood sugars, encouraged patient to routinely check blood sugar 3 times daily: Fasting, 2 hours after biggest meal, and before bed. If blood sugar is lower than 150 at bedtime, encouraged to eat high-protein snack. Bring this log to next appointment. Most likely complicated by daily prednisone for Crohn's disease-we'll continue to need close follow-up.

## 2020-05-26 NOTE — Progress Notes (Signed)
BP 111/71    Pulse (!) 104    Temp 98.7 F (37.1 C) (Oral)    Wt 134 lb 3.2 oz (60.9 kg)    LMP  (LMP Unknown)    SpO2 97%    BMI 24.55 kg/m    Subjective:    Patient ID: [name], female    DOB: August 26, 1955, 64 y.o.   MRN: [id number]  HPI: HAYDEE JABBOUR is a 64 y.o. female presenting for follow up.  Chief Complaint  Patient presents with   colitis    follow up and needs work note, doing better today, having a little abdominal pain this morning.   Reports overall, she is starting to feel better.  Has not been back to work and is asking to return to work starting November 8th, after her appointment with the Gastroenterologist.  [place] Reports this has been getting worse since she was discharged from the hospital.  A couple of days ago, she took 2 doses of her Lasix when the swelling was really bad and this helped the swelling decrease slightly. Duration: weeks Pain: no Severity: mild  Quality:  Pulling of skin Location:  tops of feet, lower calves Bilateral:  yes Onset: gradual Frequency: constant Time of  day:   at random Sudden unintentional leg jerking:   no Paresthesias:   no Decreased sensation:  no Weakness:   no Insomnia:   no Fatigue:   no Alleviating factors: laying in bed at night, Lasix twice daily Aggravating factors: walking, Status: stable Treatments attempted:  Increasing Lasix  DIABETES Reports today, woke up in the middle of a night for a blood sugar of 60, ate some food and it came up to 130.  She went back to sleep and then woke up again with symptoms and it was 67.  She was shaky, sweaty, and lightheaded. Hypoglycemic episodes:no Polydipsia/polyuria: no Visual disturbance: no Chest pain: no Paresthesias: no Glucose Monitoring: yes  Accucheck frequency: random times Taking Insulin?: yes Blood Pressure Monitoring: not checking Retinal Examination: Not up to Date Foot Exam: Not up to Date Diabetic Education: Completed Pneumovax: Up  to Date Influenza: Up to Date Aspirin: no   Allergies  Allergen Reactions   Avelox [Moxifloxacin Hcl In Nacl] Anaphylaxis   Bactrim [Sulfamethoxazole-Trimethoprim] Anaphylaxis   Ciprofloxacin Other (See Comments)    Pt states she was told never to take this as it is in the same family as Avelox.    Depakote [Divalproex Sodium]    Imitrex [Sumatriptan] Other (See Comments)    Neck and shoulder pain   Stadol [Butorphanol] Rash   Outpatient Encounter Medications as of 05/26/2020  Medication Sig   ALPRAZolam (XANAX) 0.5 MG tablet Take 1 tablet (0.5 mg total) by mouth 4 (four) times daily as needed for anxiety.   buPROPion (WELLBUTRIN XL) 150 MG 24 hr tablet Take 3 tablets (450 mg total) by mouth daily.   doxycycline (VIBRAMYCIN) 100 MG capsule Take 1 capsule (100 mg total) by mouth 2 (two) times daily for 7 days.   ferrous sulfate 325 (65 FE) MG EC tablet Take 1 tablet (325 mg total) by mouth 2 (two) times daily.   furosemide (LASIX) 20 MG tablet Take 1 tablet (20 mg total) by mouth daily.   glipiZIDE (GLUCOTROL) 5 MG tablet TAKE 1 TABLET BY MOUTH TWICE DAILY WITH MEALS (APPOINTMENT  REQUIRED  FOR  FUTURE  REFILLS) (Patient taking differently: Take 5 mg by mouth 2 (two) times daily before a meal. TAKE  1 TABLET BY MOUTH TWICE DAILY WITH MEALS (APPOINTMENT  REQUIRED  FOR  FUTURE  REFILLS))   insulin glargine, 2 Unit Dial, (TOUJEO MAX SOLOSTAR) 300 UNIT/ML Solostar Pen Inject 24 units daily. Titrate as instructed. Max daily dose 40 units   Insulin Pen Needle (PEN NEEDLES) 32G X 4 MM MISC 1 Units by Does not apply route at bedtime.   lamoTRIgine (LAMICTAL) 200 MG tablet TAKE 2 TABLETS BY MOUTH AT BEDTIME (Patient taking differently: Take 400 mg by mouth at bedtime. )   loperamide (IMODIUM) 2 MG capsule Take 1 capsule (2 mg total) by mouth as needed for diarrhea or loose stools. (Patient not taking: Reported on 05/26/2020)   metFORMIN (GLUCOPHAGE) 1000 MG tablet TAKE 1 TABLET BY  MOUTH TWICE DAILY WITH MEALS . APPOINTMENT REQUIRED FOR FUTURE REFILLS (Patient taking differently: Take 1,000 mg by mouth 2 (two) times daily with a meal. TAKE 1 TABLET BY MOUTH TWICE DAILY WITH MEALS . APPOINTMENT REQUIRED FOR FUTURE REFILLS)   nitrofurantoin, macrocrystal-monohydrate, (MACROBID) 100 MG capsule Take 1 capsule (100 mg total) by mouth every 12 (twelve) hours for 10 days.   pantoprazole (PROTONIX) 40 MG tablet Take 1 tablet (40 mg total) by mouth 2 (two) times daily.   pramipexole (MIRAPEX) 1 MG tablet Take 1 tablet (1 mg total) by mouth at bedtime.   predniSONE (DELTASONE) 20 MG tablet Take 2 tablets (40 mg total) by mouth daily with breakfast.   risperiDONE (RISPERDAL) 3 MG tablet TAKE 1 TABLET BY MOUTH EVERY DAY AT BEDTIME (Patient taking differently: Take 3 mg by mouth at bedtime. )   sertraline (ZOLOFT) 100 MG tablet Take 1.5 tablets (150 mg total) by mouth daily.   No facility-administered encounter medications on file as of 05/26/2020.   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   Type 2 diabetes mellitus ([place]) 04/25/2020   Colitis 04/21/2020   AKI (acute kidney injury) ([place]) 04/21/2020   Major depression, chronic 06/01/2018   OCD (obsessive compulsive disorder) 06/01/2018   Chronic heart failure with preserved ejection fraction ([place]) 05/16/2018   Left leg pain 11/04/2017   GERD (gastroesophageal reflux disease) 02/18/2017   Iron deficiency anemia 10/18/2016   Leg swelling 07/17/2016   Hypertension 03/21/2016   Vitamin D deficiency 02/21/2016   B12 deficiency 02/21/2016   Poorly controlled type 2 diabetes mellitus ([place]) 09/13/2015   Migraines 05/23/2015   Restless legs syndrome ([place]) 05/23/2015   Recurrent UTI 04/26/2015   Atrophic vaginitis 04/26/2015   Dyspareunia, female  04/26/2015   Past Medical History:  Diagnosis Date   Anemia    Anxiety    Chronic kidney disease    UTI, hematuria in urine   Colitis    Crohn's disease ([place])    Depression    Diabetes ([place])    Diverticulosis    Frequent headaches    Interstitial cystitis    Recurrent UTI    Restless leg syndrome    Urinary frequency    Relevant past medical, surgical, family and social history reviewed and updated as indicated. Interim medical history since our last visit reviewed.  Review of Systems  Constitutional: Negative.  Negative for activity change, appetite change, fatigue and fever.  Eyes: Negative.  Negative for visual disturbance.  Respiratory: Negative.  Negative for chest tightness, shortness of breath and wheezing.   Cardiovascular: Positive  for leg swelling. Negative for chest pain and palpitations.  Gastrointestinal: Negative.   Skin: Negative.  Negative for color change and pallor.  Neurological: Positive for weakness. Negative for dizziness, light-headedness and headaches.  Psychiatric/Behavioral: Negative.     Per HPI unless specifically indicated above     Objective:    BP 111/71    Pulse (!) 104    Temp 98.7 F (37.1 C) (Oral)    Wt 134 lb 3.2 oz (60.9 kg)    LMP  (LMP Unknown)    SpO2 97%    BMI 24.55 kg/m   Wt Readings from Last 3 Encounters:  05/26/20 134 lb 3.2 oz (60.9 kg)  05/18/20 125 lb 12.8 oz (57.1 kg)  05/06/20 120 lb (54.4 kg)    Physical Exam Vitals and nursing note reviewed.  Constitutional:      General: She is not in acute distress.    Appearance: Normal appearance. She is not toxic-appearing.  Cardiovascular:     Rate and Rhythm: Normal rate and regular rhythm.     Heart sounds: Normal heart sounds. No murmur heard.   Pulmonary:     Effort: Pulmonary effort is normal. No respiratory distress.     Breath sounds: Normal breath sounds. No wheezing, rhonchi or rales.  Abdominal:     General: Abdomen is flat. There is no  distension.     Palpations: Abdomen is soft.     Tenderness: There is no abdominal tenderness.  Musculoskeletal:     Right lower leg: No tenderness or bony tenderness. 3+ Pitting Edema present.     Left lower leg: No tenderness or bony tenderness. 3+ Pitting Edema present.  Skin:    General: Skin is warm and dry.     Coloration: Skin is not jaundiced or pale.     Findings: No erythema.  Neurological:     General: No focal deficit present.     Mental Status: She is alert and oriented to person, place, and time.     Motor: Weakness present.  Psychiatric:        Mood and Affect: Mood normal.        Behavior: Behavior normal.        Thought Content: Thought content normal.        Judgment: Judgment normal.       Assessment & Plan:   Problem List Items Addressed This Visit      Digestive   Colitis    Chronic, ongoing. Reports pain has slowed down. Will fill out FMLA paperwork for time out of work since September. To return to work November 8-work note given. Has GI appointment this upcoming Monday. Encouraged patient to discuss with them ongoing intermittent disability.        Endocrine   Poorly controlled type 2 diabetes mellitus (HCC) - Primary    Chronic, ongoing. A1c in September 7.7%, next A1c will be due in December. With some low blood sugars, encouraged patient to routinely check blood sugar 3 times daily: Fasting, 2 hours after biggest meal, and before bed. If blood sugar is lower than 150 at bedtime, encouraged to eat high-protein snack. Bring this log to next appointment. Most likely complicated by daily prednisone for Crohn's disease-we'll continue to need close follow-up.         Other   Leg swelling    Acute, ongoing. Was seen by cardiology last [place] likely needs close follow-up with them after everything is straightened with gastroenterologist. For now, will increase Lasix  to twice daily for the next 3 days. BNP, BMP, and CBC checked today. Return to clinic  in 2 weeks for follow-up.      Relevant Orders   B Nat Peptide   Basic Metabolic Panel (BMET)   CBC with Differential/Platelet       Follow up plan: Return in about 2 weeks (around 06/09/2020) for follow up of blood sugars and leg swelling.

## 2020-05-26 NOTE — Assessment & Plan Note (Addendum)
Chronic, ongoing. Reports pain has slowed down. Will fill out FMLA paperwork for time out of work since September. To return to work November 8-work note given. Has GI appointment this upcoming Monday. Encouraged patient to discuss with them ongoing intermittent disability.

## 2020-05-27 LAB — CBC WITH DIFFERENTIAL/PLATELET
Basophils Absolute: 0 10*3/uL (ref 0.0–0.2)
Basos: 0 %
EOS (ABSOLUTE): 0 10*3/uL (ref 0.0–0.4)
Eos: 0 %
Hematocrit: 35.6 % (ref 34.0–46.6)
Hemoglobin: 10.9 g/dL — ABNORMAL LOW (ref 11.1–15.9)
Immature Grans (Abs): 0 10*3/uL (ref 0.0–0.1)
Immature Granulocytes: 1 %
Lymphocytes Absolute: 0.6 10*3/uL — ABNORMAL LOW (ref 0.7–3.1)
Lymphs: 11 %
MCH: 26.6 pg (ref 26.6–33.0)
MCHC: 30.6 g/dL — ABNORMAL LOW (ref 31.5–35.7)
MCV: 87 fL (ref 79–97)
Monocytes Absolute: 0.1 10*3/uL (ref 0.1–0.9)
Monocytes: 2 %
Neutrophils Absolute: 4.8 10*3/uL (ref 1.4–7.0)
Neutrophils: 86 %
Platelets: 364 10*3/uL (ref 150–450)
RBC: 4.1 x10E6/uL (ref 3.77–5.28)
WBC: 5.6 10*3/uL (ref 3.4–10.8)

## 2020-05-27 LAB — BASIC METABOLIC PANEL WITH GFR
BUN/Creatinine Ratio: 8 — ABNORMAL LOW (ref 12–28)
BUN: 5 mg/dL — ABNORMAL LOW (ref 8–27)
CO2: 21 mmol/L (ref 20–29)
Calcium: 8.1 mg/dL — ABNORMAL LOW (ref 8.7–10.3)
Chloride: 103 mmol/L (ref 96–106)
Creatinine, Ser: 0.61 mg/dL (ref 0.57–1.00)
GFR calc Af Amer: 112 mL/min/1.73
GFR calc non Af Amer: 97 mL/min/1.73
Glucose: 261 mg/dL — ABNORMAL HIGH (ref 65–99)
Potassium: 4.1 mmol/L (ref 3.5–5.2)
Sodium: 138 mmol/L (ref 134–144)

## 2020-05-27 LAB — BRAIN NATRIURETIC PEPTIDE: BNP: 55 pg/mL (ref 0.0–100.0)

## 2020-05-29 NOTE — Progress Notes (Signed)
05/31/2020 Amy Hansen [id number] 30-Oct-1955   Chief Complaint: colitis follow up   History of Present Illness: Amy Hansen is a 64 year old female with a past medical history anxiety, depression, hypertension, DM II, restless syndrome and iron deficiency anemia which required IV iron 2018 and 04/22/2020. S/P gastric bypass surgery 2010. She was admitted at [facility] hospital from 9/23-9/26 for pancolitis. She had associated rectal bleeding requiring 2 unit PRBC transfusions. She was treated with IV Zosyn then discharged home on Augmentin. Her colitis and rectal bleeding were thought to be NSAID related. She reported taking Ibuprofen alternating with Tylenol for back pain. C. difficile and GI pathogen panels were negative.   She came back to the hospital on 9/27-9/28 for generalized weakness. Stroke work-up was performed on this occasion and was found to be negative for CVA. She was sent home on ASA 81 mg daily for a presumed TIA.  She was readmitted to the hospital 04/30/2020 with a colitis flare. She reported having black watery diarrhea 5 to 6 times daily since her discharged from the hospital 9/26. Her Hg dropped to 7.0. She received 1 unit of PRBCs. She underwent a colonoscopy by Dr. Ardis Hughs which showed nonspecific ileitis and nonspecific colitis. Crohn's disease was suspected but her colonoscopy biopsies are not definitive. She was discharged home 10/5 on Prednisone 10m QD. Her Hg level was 9.2 at the time of discharge.    She presents to our office today for further follow up and to establish her GI management with Dr. AHavery Moros She reports feeling fairly well. Today is her lats dose of Prednisone 474mQD, she requires further Prednisone tapering instructions and RX. She is having less frequent BMs and minimal lower abdominal pain. She is passing 4 to 5 loose stools daily. No bloody diarrhea. No melena. Her appetite is good. No fevers. No family history of IBD. Her mother died from  colon cancer at the age of 63  CBC Latest Ref Rng & Units 05/26/2020 05/06/2020 05/03/2020  WBC 3.4 - 10.8 x10E3/uL 5.6 7.7 -  Hemoglobin 11.1 - 15.9 g/dL 10.9(L) 10.1(L) 9.2(L)  Hematocrit 34.0 - 46.6 % 35.6 32.4(L) 30.1(L)  Platelets 150 - 450 x10E3/uL 364 604(H) -   CMP Latest Ref Rng & Units 05/26/2020 05/06/2020 05/03/2020  Glucose 65 - 99 mg/dL 261(H) 184(H) 108(H)  BUN 8 - 27 mg/dL 5(L) 6(L) <5(L)  Creatinine 0.57 - 1.00 mg/dL 0.61 0.58 0.51  Sodium 134 - 144 mmol/L 138 136 137  Potassium 3.5 - 5.2 mmol/L 4.1 4.3 3.0(L)  Chloride 96 - 106 mmol/L 103 97 104  CO2 20 - 29 mmol/L _0 Calcium 8.7 - 10.3 mg/dL 8.1(L) 8.4(L) 8.1(L)  Total Protein 6.0 - 8.5 g/dL - 5.5(L) -  Total Bilirubin 0.0 - 1.2 mg/dL - <0.2 -  Alkaline Phos 44 - 121 IU/L - 143(H) -  AST 0 - 40 IU/L - 18 -  ALT 0 - 32 IU/L - 15 -     Colonoscopy 05/03/2020: - Mild to moderate inflammation from anus to cecum with a normal appearing terminal ileum. Multiple biopsies taken.  A. SMALL BOWEL, TERMINAL ILEUM, BIOPSY:  - Focally active nonspecific ileitis  - Negative for features of chronicity or granulomas   B. COLON, RIGHT, BIOPSY:  - Patchy active nonspecific colitis with focal ulceration  - Focal microscopic aggregate of epithelioid histiocytes suggestive of a  microadenoma  - See comment   C. COLON, LEFT, BIOPSY:  -  Patchy mildly active nonspecific colitis  - Negative for features of chronicity or granulomas   CTAP with contrast 04/21/2020: 1. Diffuse circumferential wall thickening of virtually all of the colon, most notably at the level of the cecum and ascending colon, consistent with infectious or inflammatory colitis. 2. There is a punctate focus of gas within the urinary bladder. Correlate for history of recent instrumentation. Aortic Atherosclerosis    Current Outpatient Medications on File Prior to Visit  Medication Sig Dispense Refill  . ALPRAZolam (XANAX) 0.5 MG tablet Take 1  tablet (0.5 mg total) by mouth 4 (four) times daily as needed for anxiety. 120 tablet 5  . buPROPion (WELLBUTRIN XL) 150 MG 24 hr tablet Take 3 tablets (450 mg total) by mouth daily. 90 tablet 2  . ferrous sulfate 325 (65 FE) MG EC tablet Take 1 tablet (325 mg total) by mouth 2 (two) times daily. 60 tablet 3  . furosemide (LASIX) 20 MG tablet Take 1 tablet (20 mg total) by mouth daily. 30 tablet 11  . glipiZIDE (GLUCOTROL) 5 MG tablet TAKE 1 TABLET BY MOUTH TWICE DAILY WITH MEALS (APPOINTMENT  REQUIRED  FOR  FUTURE  REFILLS) (Patient taking differently: Take 5 mg by mouth 2 (two) times daily before a meal. TAKE 1 TABLET BY MOUTH TWICE DAILY WITH MEALS (APPOINTMENT  REQUIRED  FOR  FUTURE  REFILLS)) 180 tablet 0  . insulin glargine, 2 Unit Dial, (TOUJEO MAX SOLOSTAR) 300 UNIT/ML Solostar Pen Inject 24 units daily. Titrate as instructed. Max daily dose 40 units 12 mL 1  . Insulin Pen Needle (PEN NEEDLES) 32G X 4 MM MISC 1 Units by Does not apply route at bedtime. 100 each 1  . lamoTRIgine (LAMICTAL) 200 MG tablet TAKE 2 TABLETS BY MOUTH AT BEDTIME (Patient taking differently: Take 400 mg by mouth at bedtime. ) 180 tablet 1  . loperamide (IMODIUM) 2 MG capsule Take 1 capsule (2 mg total) by mouth as needed for diarrhea or loose stools. 30 capsule 0  . metFORMIN (GLUCOPHAGE) 1000 MG tablet TAKE 1 TABLET BY MOUTH TWICE DAILY WITH MEALS . APPOINTMENT REQUIRED FOR FUTURE REFILLS (Patient taking differently: Take 1,000 mg by mouth 2 (two) times daily with a meal. TAKE 1 TABLET BY MOUTH TWICE DAILY WITH MEALS . APPOINTMENT REQUIRED FOR FUTURE REFILLS) 60 tablet 0  . pramipexole (MIRAPEX) 1 MG tablet Take 1 tablet (1 mg total) by mouth at bedtime. 90 tablet 1  . risperiDONE (RISPERDAL) 3 MG tablet TAKE 1 TABLET BY MOUTH EVERY DAY AT BEDTIME (Patient taking differently: Take 3 mg by mouth at bedtime. ) 30 tablet 5  . sertraline (ZOLOFT) 100 MG tablet Take 1.5 tablets (150 mg total) by mouth daily. 45 tablet 2   No  current facility-administered medications on file prior to visit.   Allergies  Allergen Reactions  . Avelox [Moxifloxacin Hcl In Nacl] Anaphylaxis  . Bactrim [Sulfamethoxazole-Trimethoprim] Anaphylaxis  . Ciprofloxacin Other (See Comments)    Pt states she was told never to take this as it is in the same family as Avelox.   . Depakote [Divalproex Sodium]   . Imitrex [Sumatriptan] Other (See Comments)    Neck and shoulder pain  . Stadol [Butorphanol] Rash   Current Medications, Allergies, Past Medical History, Past Surgical History, Family History and Social History were reviewed in [employer] record.   Review of Systems:   Constitutional: Negative for fever, sweats, chills or weight loss.  Respiratory: Negative for shortness of breath.  Cardiovascular: Negative for chest pain, palpitations and leg swelling.  Gastrointestinal: See HPI.  Musculoskeletal: + back pain.  Neurological: Negative for dizziness, headaches or paresthesias.    Physical Exam: BP 108/60   Pulse 86   Ht _0  (1.549 m)   Wt 126 lb (57.2 kg)   LMP  (LMP Unknown)   SpO2 98%   BMI 23.81 kg/m   BP 108/60   Pulse 86   Ht _1  (1.549 m)   Wt 126 lb (57.2 kg)   LMP  (LMP Unknown)   SpO2 98%   BMI 23.81 kg/m   General: Well developed 64 year old female in no acute distress. Head: Normocephalic and atraumatic. Eyes: No scleral icterus. Conjunctiva pink . Ears: Normal auditory acuity. Mouth: Dentition intact. No ulcers or lesions.  Lungs: Clear throughout to auscultation. Heart: Regular rate and rhythm, no murmur. Abdomen: Soft, nontender and nondistended. No masses or hepatomegaly. Large vertical scar right of midline.  Normal bowel sounds x 4 quadrants.  Rectal: Deferred.  Musculoskeletal: Symmetrical with no gross deformities. Extremities: No edema. Neurological: Alert oriented x 4. No focal deficits.  Psychological: Alert and cooperative. Normal mood and  affect  Assessment and Recommendations:  28. 64 year old female with pancolitis s/p colonoscopy during he hospital admission 05/03/2020 showed nonspecific ileitis and colitis, suspected Crohn's disease +/- NSAID component -Prednisone taper as follows: Prednisone 89m QD x 7 days then 250mQD x 7 days then 2037mD x 7 days then 21m77m x 7 days then 10mg27mx 7 days then 5mg Q9m 7 days  -Consider starting Lialda, await recommendations from Dr. ArmbruHavery MorosNSAIDs. ASA was discontinued during her hospitalization -C. Diff PCR quant  -Reduce Pantoprazole to QD ( Pantoprazole 40mg p42md may be contributing to her ongoing diarrhea) -Follow up in office in 6 to 8 weeks -Patient to call our office if her loose stools or abdominal pain persists or worsens  2. 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 She received Feraheme IV x 2  in 2018 and x 1 on 04/22/2020.  -Iron, iron saturation, TIBC, Ferritin level in 2 weeks. (She had labs done 10/28 and she preferred to have iron studies in 2 weeks. Repeat CBC in 2 weeks as well)  3. Elevated alk phos with normal AST, ALT and T. Bili levels. CTAP 04/21/2020 showed a normal liver, mild intrahepatic and extrahepatic biliary ductal dilatation s/p cholecystectomy -Add hepatic panel, GGT, ANA, SMA and AMA to the above lab order -Consider abdominal MRI/MRCP after the above lab results reviewed

## 2020-05-30 ENCOUNTER — Encounter: Payer: Self-pay | Admitting: Nurse Practitioner

## 2020-05-30 ENCOUNTER — Ambulatory Visit (INDEPENDENT_AMBULATORY_CARE_PROVIDER_SITE_OTHER): Payer: Self-pay | Admitting: Nurse Practitioner

## 2020-05-30 VITALS — BP 108/60 | HR 86 | Ht 61.0 in | Wt 126.0 lb

## 2020-05-30 DIAGNOSIS — K529 Noninfective gastroenteritis and colitis, unspecified: Secondary | ICD-10-CM

## 2020-05-30 DIAGNOSIS — D509 Iron deficiency anemia, unspecified: Secondary | ICD-10-CM

## 2020-05-30 MED ORDER — PREDNISONE 10 MG PO TABS: ORAL_TABLET | ORAL | 0 refills | Status: AC

## 2020-05-30 MED ORDER — PANTOPRAZOLE SODIUM 40 MG PO TBEC
40.0000 mg | DELAYED_RELEASE_TABLET | Freq: Every day | ORAL | 1 refills | Status: DC
Start: 2020-05-30 — End: 2021-02-10

## 2020-05-30 NOTE — Patient Instructions (Addendum)
If you are age 63 or older, your body mass index should be between 23-30. Your Body mass index is 23.81 kg/m. If this is out of the aforementioned range listed, please consider follow up with your Primary Care Provider.  If you are age 9 or younger, your body mass index should be between 19-25. Your Body mass index is 23.81 kg/m. If this is out of the aformentioned range listed, please consider follow up with your Primary Care Provider.   Your provider has requested that you go to the basement level for lab work in two weeks. Press "B" on the elevator. The lab is located at the first door on the left as you exit the elevator.  Due to recent changes in healthcare laws, you may see the results of your imaging and laboratory studies on MyChart before your provider has had a chance to review them.  We understand that in some cases there may be results that are confusing or concerning to you. Not all laboratory results come back in the same time frame and the provider may be waiting for multiple results in order to interpret others.  Please give [name] 48 hours in order for your provider to thoroughly review all the results before contacting the office for clarification of your results.   Decrease your Pantoprazole to 40 mg once daily.  START Pantoprazole 10 mg 3 tablets daily for 7 days, then 2.5 tablets for 7 days, then 2 tablets for 7 days, then 1.5 tablets for 7 days, then 1 tablet daily for 7 days, then 0.5 tablet for 7 days.  Pleas contact the office in a few weeks to schedule your 2 month follow up with [name], CRNP.

## 2020-05-31 NOTE — Progress Notes (Signed)
Beth, Pls contact the patient and let her know Dr. Havery Moros would like her to start taking Lialda 2.4gm po once daily. Continue Prednisone taper and prescribed. Remind her no NSAIDs. Pt to call our office if her symptoms worsen. She has a follow up appt scheduled.  Pls send RX for Lialda 1.2gm tab take two tabs po QD # 60, 2 additional refills. Thx

## 2020-05-31 NOTE — Progress Notes (Signed)
Agree with assessment and plan as outlined. NSAIDs could have been related to this presentation although symptoms much more dramatic and severe that would expect with solely NSAIDs. Path not diagnostic for Crohn's but that remains quite possible. Time will tell what her diagnosis is. Sounds like she has improved significantly with the prednisone and would taper as outlined. Lialda is safe and I think reasonable to try this dosed at 2.4gm / day as she tapers off prednisone and see how she does, but will need to keep a close eye on things. If she truly has Crohn's disease, I think would benefit most from biologic therapy, and Lialda may not be the best long term choice for her but I think okay to try it now and await her course. Will await GI pathogen panel to ensure nothing else going on in light of her prednisone use, ensure she did not develop C Diff. Will need to keep a close eye on her blood counts to make sure anemia does not recur. I will see her back in 6-8 weeks for reassessment and to discuss long term plan. If she flares in the interim while tapering prednisone she needs to let us know. She needs to completely avoid all NSAIDs.

## 2020-06-01 ENCOUNTER — Other Ambulatory Visit: Payer: Self-pay

## 2020-06-01 ENCOUNTER — Telehealth: Payer: Self-pay

## 2020-06-01 DIAGNOSIS — R748 Abnormal levels of other serum enzymes: Secondary | ICD-10-CM

## 2020-06-01 NOTE — Telephone Encounter (Signed)
Called the patient. No answer. Left a message to call back to discuss.

## 2020-06-01 NOTE — Telephone Encounter (Addendum)
-----   Message from Noralyn Pick, NP sent at 05/31/2020  2:40 PM EDT ----- Author: Noralyn Pick, NP Service: Gastroenterology Author Type: Nurse Practitioner  Filed: 05/31/2020 2:45 PM Encounter Date: 05/30/2020 Status: Signed  Editor: Noralyn Pick, NP (Nurse Practitioner)     Show:Clear all [x] Manual[] Template[] Copied  Added by: [x] Noralyn Pick, NP  [] Hover for details Beth, Pls contact the patient and let her know Dr. Havery Moros would like her to start taking Lialda 2.4gm po once daily. Continue Prednisone taper and prescribed. Remind her no NSAIDs. Pt to call our office if her symptoms worsen. She has a follow up appt scheduled.  Pls send RX for Lialda 1.2gm tab take two tabs po QD # 60, 2 additional refills. Thx

## 2020-06-01 NOTE — Progress Notes (Signed)
ggtt

## 2020-06-02 ENCOUNTER — Other Ambulatory Visit: Payer: Self-pay

## 2020-06-02 ENCOUNTER — Other Ambulatory Visit (INDEPENDENT_AMBULATORY_CARE_PROVIDER_SITE_OTHER): Payer: Self-pay

## 2020-06-02 DIAGNOSIS — D509 Iron deficiency anemia, unspecified: Secondary | ICD-10-CM

## 2020-06-02 DIAGNOSIS — K529 Noninfective gastroenteritis and colitis, unspecified: Secondary | ICD-10-CM

## 2020-06-02 DIAGNOSIS — R748 Abnormal levels of other serum enzymes: Secondary | ICD-10-CM

## 2020-06-02 LAB — B12 AND FOLATE PANEL
Folate: 23 ng/mL (ref >5.9)
Vitamin B-12: 208 pg/mL — ABNORMAL LOW (ref 211–911)

## 2020-06-02 LAB — CBC
HCT: 36.7 % (ref 36.0–46.0)
Hemoglobin: 11.8 g/dL — ABNORMAL LOW (ref 12.0–15.0)
MCHC: 32.2 g/dL (ref 30.0–36.0)
MCV: 83.9 fl (ref 78.0–100.0)
Platelets: 357 10*3/uL (ref 150.0–400.0)
RBC: 4.38 Mil/uL (ref 3.87–5.11)
RDW: 29.7 % — ABNORMAL HIGH (ref 11.5–15.5)
WBC: 5.6 10*3/uL (ref 4.0–10.5)

## 2020-06-02 LAB — HEPATIC FUNCTION PANEL
ALT: 67 U/L — ABNORMAL HIGH (ref 0–35)
AST: 42 U/L — ABNORMAL HIGH (ref 0–37)
Albumin: 3.4 g/dL — ABNORMAL LOW (ref 3.5–5.2)
Alkaline Phosphatase: 115 U/L (ref 39–117)
Bilirubin, Direct: 0 mg/dL (ref 0.0–0.3)
Total Bilirubin: 0.2 mg/dL (ref 0.2–1.2)
Total Protein: 6 g/dL (ref 6.0–8.3)

## 2020-06-02 LAB — GAMMA GT: GGT: 109 U/L — ABNORMAL HIGH (ref 7–51)

## 2020-06-02 LAB — VITAMIN D 25 HYDROXY (VIT D DEFICIENCY, FRACTURES): VITD: 10.78 ng/mL — ABNORMAL LOW (ref 30.00–100.00)

## 2020-06-02 MED ORDER — MESALAMINE 1.2 G PO TBEC: 2.4000 g | DELAYED_RELEASE_TABLET | Freq: Every day | ORAL | 2 refills | Status: DC

## 2020-06-02 NOTE — Telephone Encounter (Signed)
Spoke to Amy Hansen and advised her about the Lialda medication and to continue prednisone taper as prescribed. She would like the medication to be sent to [facility] on [address] in [place]. Says she will come in today to obtain labwork that she declined the other day. Would like the lab orders released so she can stop by with no issues. Says she will call back next week to schedule the follow up or sooner if she has any issues.

## 2020-06-02 NOTE — Telephone Encounter (Signed)
Lialda 1.2 gm.  To take 2 PO daily transmitted to the [facility] as requested.

## 2020-06-03 ENCOUNTER — Telehealth: Payer: Self-pay | Admitting: Nurse Practitioner

## 2020-06-03 ENCOUNTER — Other Ambulatory Visit: Payer: Self-pay

## 2020-06-03 ENCOUNTER — Telehealth: Payer: Self-pay | Admitting: Family Medicine

## 2020-06-03 DIAGNOSIS — K529 Noninfective gastroenteritis and colitis, unspecified: Secondary | ICD-10-CM

## 2020-06-03 DIAGNOSIS — D509 Iron deficiency anemia, unspecified: Secondary | ICD-10-CM

## 2020-06-03 NOTE — Telephone Encounter (Signed)
Patient is calling for advice and lab results regarding her liver. Patient has viewed lab results on mychart. CB- [phone]

## 2020-06-03 NOTE — Telephone Encounter (Signed)
Called the patient back. She has seen some of her lab results that have not been reviewed by her provider as of yet. Labs were drawn in the last 24 hours. With her permission, I looked at the currently resulted labs. She was particularly worried about the liver test. I explained that while I cannot give recommendations, I can tell her of things that can cause elevations in these tests including, some medications, steatosis and alcohol. Reassured th patient that once all of the labs are resulted and reviewed by her provider, she will be contacted. Patient expressed gratitude for my call.

## 2020-06-03 NOTE — Telephone Encounter (Signed)
The doctor has not yet reviewed lab results for the patient. Once reviewed, and documented on we will call the patient with results.

## 2020-06-08 LAB — GI PROFILE, STOOL, PCR
Adenovirus F 40/41: NOT DETECTED
Astrovirus: NOT DETECTED
C difficile toxin A/B: NOT DETECTED
Campylobacter: NOT DETECTED
Cryptosporidium: NOT DETECTED
Cyclospora cayetanensis: NOT DETECTED
E coli O157: NOT DETECTED
Entamoeba histolytica: NOT DETECTED
Enteroaggregative E coli: NOT DETECTED
Enteropathogenic E coli: NOT DETECTED
Enterotoxigenic E coli: NOT DETECTED
Giardia lamblia: NOT DETECTED
Norovirus GI/GII: NOT DETECTED
Plesiomonas shigelloides: NOT DETECTED
Rotavirus A: NOT DETECTED
Salmonella: NOT DETECTED
Sapovirus: NOT DETECTED
Shiga-toxin-producing E coli: NOT DETECTED
Shigella/Enteroinvasive E coli: NOT DETECTED
Vibrio cholerae: NOT DETECTED
Vibrio: NOT DETECTED
Yersinia enterocolitica: NOT DETECTED

## 2020-06-09 ENCOUNTER — Telehealth: Payer: Self-pay | Admitting: Nurse Practitioner

## 2020-06-09 LAB — MITOCHONDRIAL ANTIBODIES: Mitochondrial M2 Ab, IgG: 24.5 U — ABNORMAL HIGH

## 2020-06-09 LAB — ANTI-NUCLEAR AB-TITER (ANA TITER): ANA Titer 1: 1:40 {titer} — ABNORMAL HIGH

## 2020-06-09 LAB — IRON,TIBC AND FERRITIN PANEL
%SAT: 13 % (calc) — ABNORMAL LOW (ref 16–45)
Ferritin: 30 ng/mL (ref 16–288)
Iron: 39 ug/dL — ABNORMAL LOW (ref 45–160)
TIBC: 310 mcg/dL (calc) (ref 250–450)

## 2020-06-09 LAB — ANA: Anti Nuclear Antibody (ANA): POSITIVE — AB

## 2020-06-09 LAB — ANTI-SMOOTH MUSCLE ANTIBODY, IGG: Actin (Smooth Muscle) Antibody (IGG): <20 U (ref <20)

## 2020-06-09 NOTE — Telephone Encounter (Signed)
Pt is requesting a call back from a nurse regarding her lab results, pt missed a call from [place] this morning

## 2020-06-10 ENCOUNTER — Ambulatory Visit: Payer: Self-pay | Admitting: Nurse Practitioner

## 2020-06-13 NOTE — Telephone Encounter (Signed)
2nd call to patient, see lab notes and my chart msg.

## 2020-06-14 ENCOUNTER — Telehealth: Payer: Self-pay

## 2020-06-14 ENCOUNTER — Other Ambulatory Visit: Payer: Self-pay

## 2020-06-14 DIAGNOSIS — E559 Vitamin D deficiency, unspecified: Secondary | ICD-10-CM

## 2020-06-14 DIAGNOSIS — D509 Iron deficiency anemia, unspecified: Secondary | ICD-10-CM

## 2020-06-14 DIAGNOSIS — R748 Abnormal levels of other serum enzymes: Secondary | ICD-10-CM

## 2020-06-14 MED ORDER — VITAMIN D3 1.25 MG (50000 UT) PO CAPS: 1.0000 | ORAL_CAPSULE | ORAL | 0 refills | Status: DC

## 2020-06-14 NOTE — Telephone Encounter (Signed)
See result note from her Gastro provider from 06/02/20 labs. Patient was told to follow up with PCP for B12 replacement.

## 2020-06-14 NOTE — Telephone Encounter (Signed)
Patient notified of Amy Hansen's recommendation and verbalized understanding. Patient states she will call back to schedule lab visit as she does not have her schedule yet.

## 2020-06-14 NOTE — Telephone Encounter (Signed)
Copied from [place] [id number]. Topic: General - Other >> Jun 14, 2020 11:49 AM Leward Quan A wrote: Reason for CRM: Patient called in to inquire of Noemi Chapel if she can start her coming to the office for her B-12 shots. Please call Ph# [phone]

## 2020-06-14 NOTE — Telephone Encounter (Signed)
Thank you for pointing that out.  She can start with oral replacement - take OTC Vitamin B12 1000 mcg daily and ensure we recheck levels in about 6 weeks.

## 2020-06-14 NOTE — Telephone Encounter (Signed)
I don't see where she has been on B-12 injections - please call her to clarify?

## 2020-06-21 ENCOUNTER — Telehealth: Payer: Self-pay | Admitting: General Practice

## 2020-06-21 ENCOUNTER — Telehealth: Payer: Self-pay

## 2020-06-21 NOTE — Telephone Encounter (Signed)
  Chronic Care Management   Outreach Note  06/21/2020 Name: Amy Hansen MRN: [id number] DOB: 1955-11-15  Referred by: Volney American, PA-C Reason for referral : Care Coordination (RNCM Follow up Call for Chronic Disease Management and Care Coordination Needs )   An unsuccessful telephone outreach was attempted today. The patient was referred to the case management team for assistance with care management and care coordination.   Follow Up Plan: A HIPAA compliant phone message was left for the patient providing contact information and requesting a return call.   Noreene Larsson RN, MSN, [facility] Mobile: [phone]

## 2020-06-27 ENCOUNTER — Ambulatory Visit: Payer: Self-pay | Admitting: Physician Assistant

## 2020-06-28 ENCOUNTER — Other Ambulatory Visit (INDEPENDENT_AMBULATORY_CARE_PROVIDER_SITE_OTHER): Payer: Self-pay

## 2020-06-28 ENCOUNTER — Other Ambulatory Visit: Payer: Self-pay | Admitting: Physician Assistant

## 2020-06-28 DIAGNOSIS — R748 Abnormal levels of other serum enzymes: Secondary | ICD-10-CM

## 2020-06-28 DIAGNOSIS — D509 Iron deficiency anemia, unspecified: Secondary | ICD-10-CM

## 2020-06-28 DIAGNOSIS — E559 Vitamin D deficiency, unspecified: Secondary | ICD-10-CM

## 2020-06-28 LAB — VITAMIN D 25 HYDROXY (VIT D DEFICIENCY, FRACTURES): VITD: 34.85 ng/mL (ref 30.00–100.00)

## 2020-06-28 LAB — CBC
HCT: 38.8 % (ref 36.0–46.0)
Hemoglobin: 12.7 g/dL (ref 12.0–15.0)
MCHC: 32.7 g/dL (ref 30.0–36.0)
MCV: 87.5 fl (ref 78.0–100.0)
Platelets: 413 10*3/uL — ABNORMAL HIGH (ref 150.0–400.0)
RBC: 4.43 Mil/uL (ref 3.87–5.11)
RDW: 22.6 % — ABNORMAL HIGH (ref 11.5–15.5)
WBC: 7.2 10*3/uL (ref 4.0–10.5)

## 2020-06-28 LAB — HEPATIC FUNCTION PANEL
ALT: 32 U/L (ref 0–35)
AST: 27 U/L (ref 0–37)
Albumin: 3.7 g/dL (ref 3.5–5.2)
Alkaline Phosphatase: 104 U/L (ref 39–117)
Bilirubin, Direct: 0 mg/dL (ref 0.0–0.3)
Total Bilirubin: 0.2 mg/dL (ref 0.2–1.2)
Total Protein: 6.7 g/dL (ref 6.0–8.3)

## 2020-06-29 ENCOUNTER — Ambulatory Visit: Payer: Self-pay | Admitting: Gastroenterology

## 2020-06-29 ENCOUNTER — Other Ambulatory Visit: Payer: Self-pay | Admitting: Physician Assistant

## 2020-06-29 ENCOUNTER — Telehealth: Payer: Self-pay | Admitting: Urology

## 2020-06-29 LAB — IGG: IgG (Immunoglobin G), Serum: 1098 mg/dL (ref 600–1540)

## 2020-06-29 LAB — IRON,TIBC AND FERRITIN PANEL
%SAT: 14 % (calc) — ABNORMAL LOW (ref 16–45)
Ferritin: 23 ng/mL (ref 16–288)
Iron: 48 ug/dL (ref 45–160)
TIBC: 355 mcg/dL (calc) (ref 250–450)

## 2020-06-29 MED ORDER — ESTRADIOL 0.1 MG/GM VA CREA: TOPICAL_CREAM | VAGINAL | 12 refills | Status: DC

## 2020-06-29 NOTE — Telephone Encounter (Signed)
Done

## 2020-06-29 NOTE — Telephone Encounter (Signed)
Patient requesting a new prescription for estradol cream.  The one that she has (prescribed by Dr. Erlene Quan) is expired.  She would like it sent to the [facility] on [address] in [place].

## 2020-06-29 NOTE — Telephone Encounter (Signed)
Per DPR, ok to leave detailed message on VM. Notified patient of RX.

## 2020-07-07 ENCOUNTER — Telehealth: Payer: Self-pay

## 2020-07-07 NOTE — Chronic Care Management (AMB) (Signed)
  Care Management   Note  07/07/2020 Name: ACQUANETTA CABANILLA MRN: [id number] DOB: [birth year]  DARIELA STOKER is a 64 y.o. year old female who is a primary care patient of Volney American, Hershal Coria and is actively engaged with the care management team. I reached out to [name] by phone today to assist with re-scheduling a follow up visit with the RN Case Manager  Follow up plan: Telephone appointment with care management team member scheduled for:08/27/2019  Noreene Larsson, [place] [address], [place], [place] [address] Direct Dial: [phone] [email or web address]

## 2020-07-07 NOTE — Chronic Care Management (AMB) (Signed)
  Care Management   Note  07/07/2020 Name: [name] MRN: [id number] DOB: 25-Jan-1956  Amy Hansen is a 64 y.o. year old female who is a primary care patient of Volney American, Hershal Coria and is actively engaged with the care management team. I reached out to [name] by phone today to assist with re-scheduling a follow up visit with the RN Case Manager  Follow up plan: Unsuccessful telephone outreach attempt made. A HIPAA compliant phone message was left for the patient providing contact information and requesting a return call.  The care management team will reach out to the patient again over the next 7 days.  If patient returns call to provider office, please advise to call [facility]  at [place], [place], [place], [place] [address] Direct Dial: [phone] [email or web address]: [email or web address]

## 2020-07-12 ENCOUNTER — Ambulatory Visit (INDEPENDENT_AMBULATORY_CARE_PROVIDER_SITE_OTHER): Payer: Self-pay | Admitting: Nurse Practitioner

## 2020-07-12 ENCOUNTER — Encounter: Payer: Self-pay | Admitting: Nurse Practitioner

## 2020-07-12 ENCOUNTER — Other Ambulatory Visit (INDEPENDENT_AMBULATORY_CARE_PROVIDER_SITE_OTHER): Payer: Self-pay

## 2020-07-12 VITALS — BP 110/64 | HR 105 | Ht 62.0 in | Wt 133.0 lb

## 2020-07-12 DIAGNOSIS — K529 Noninfective gastroenteritis and colitis, unspecified: Secondary | ICD-10-CM

## 2020-07-12 DIAGNOSIS — D509 Iron deficiency anemia, unspecified: Secondary | ICD-10-CM

## 2020-07-12 LAB — HEPATIC FUNCTION PANEL
ALT: 37 U/L — ABNORMAL HIGH (ref 0–35)
AST: 38 U/L — ABNORMAL HIGH (ref 0–37)
Albumin: 3.3 g/dL — ABNORMAL LOW (ref 3.5–5.2)
Alkaline Phosphatase: 116 U/L (ref 39–117)
Bilirubin, Direct: 0 mg/dL (ref 0.0–0.3)
Total Bilirubin: 0.2 mg/dL (ref 0.2–1.2)
Total Protein: 6.2 g/dL (ref 6.0–8.3)

## 2020-07-12 MED ORDER — MESALAMINE 1.2 G PO TBEC: 4.8000 g | DELAYED_RELEASE_TABLET | Freq: Every day | ORAL | 1 refills | Status: DC

## 2020-07-12 MED ORDER — NYSTATIN 100000 UNIT/GM EX POWD: 1.0000 "application " | Freq: Three times a day (TID) | CUTANEOUS | 0 refills | Status: DC

## 2020-07-12 NOTE — Progress Notes (Signed)
07/12/2020 GLENETTA KIGER [id number] 1955/08/10   Chief Complaint: Follow up colitis   History of Present Illness: Amy Hansen is a 64 year old female with a past medical history anxiety, depression, hypertension, DM II, restless syndrome and iron deficiency anemia which required IV iron 2018 and 04/22/2020. S/P gastric bypass surgery 2010.  Refer to her office consultation 05/30/2020 for a comprehensive history review including her recent hospital admissions for IDA and colitis.  At that time, her colitis symptoms improved on Prednisone 40 mg daily.  She was having 4-5 loose stools at that time, no bloody diarrhea.  She was instructed to taper her Prednisone by 5 mg weekly until off.  She is currently on Prednisone 5 mg daily for the past 2 to 3 days.  She remains on Mesalamine 1.2 g two tabs daily since 11/1.  Overall, she reports feeling  well.  She is passing a softly formed to loose bowel movement four times daily and every other day she is passing a firm solid stool.  No bloody stools.  No abdominal pain.  Her appetite is good.  No nausea or vomiting.  Repeat laboratory studies 06/28/2020 showed improved hemoglobin 12.7.  Iron 48.  Iron saturation 14.  Ferritin 23.  LFTs were normal.  IgG 1098.  See results below.  Note laboratory studies 06/02/2020 showed a positive ANA.  AMA mildly elevated 24.5.  SMA < 20.    CBC Latest Ref Rng & Units 06/28/2020 06/02/2020 05/26/2020  WBC 4.0 - 10.5 K/uL 7.2 5.6 5.6  Hemoglobin 12.0 - 15.0 g/dL 12.7 11.8(L) 10.9(L)  Hematocrit 36.0 - 46.0 % 38.8 36.7 35.6  Platelets 150.0 - 400.0 K/uL 413.0(H) 357.0 364    CMP Latest Ref Rng & Units 06/28/2020 06/02/2020 05/26/2020  Glucose 65 - 99 mg/dL - - 261(H)  BUN 8 - 27 mg/dL - - 5(L)  Creatinine 0.57 - 1.00 mg/dL - - 0.61  Sodium 134 - 144 mmol/L - - 138  Potassium 3.5 - 5.2 mmol/L - - 4.1  Chloride 96 - 106 mmol/L - - 103  CO2 20 - 29 mmol/L - - 21  Calcium 8.7 - 10.3 mg/dL - - 8.1(L)  Total  Protein 6.0 - 8.3 g/dL 6.7 6.0 -  Total Bilirubin 0.2 - 1.2 mg/dL 0.2 0.2 -  Alkaline Phos 39 - 117 U/L 104 115 -  AST 0 - 37 U/L 27 42(H) -  ALT 0 - 35 U/L 32 67(H) -    Colonoscopy 05/03/2020: - Mild to moderate inflammation from anus to cecum with a normal appearing terminal ileum. Multiple biopsies taken.  A. SMALL BOWEL, TERMINAL ILEUM, BIOPSY:  - Focally active nonspecific ileitis  - Negative for features of chronicity or granulomas   B. COLON, RIGHT, BIOPSY:  - Patchy active nonspecific colitis with focal ulceration  - Focal microscopic aggregate of epithelioid histiocytes suggestive of a  microadenoma  - See comment   C. COLON, LEFT, BIOPSY:  - Patchy mildly active nonspecific colitis  - Negative for features of chronicity or granulomas   CTAP with contrast 04/21/2020: 1. Diffuse circumferential wall thickening of virtually all of the colon, most notably at the level of the cecum and ascending colon, consistent with infectious or inflammatory colitis. 2. There is a punctate focus of gas within the urinary bladder. Correlate for history of recent instrumentation. Aortic Atherosclerosis   . Current Outpatient Medications on File Prior to Visit  Medication Sig Dispense Refill  . ALPRAZolam Duanne Moron)  0.5 MG tablet Take 1 tablet (0.5 mg total) by mouth 4 (four) times daily as needed for anxiety. 120 tablet 5  . buPROPion (WELLBUTRIN XL) 150 MG 24 hr tablet Take 3 tablets by mouth once daily 270 tablet 0  . Cholecalciferol (VITAMIN D3) 1.25 MG (50000 UT) CAPS Take 1 capsule by mouth once a week. 12 capsule 0  . estradiol (ESTRACE) 0.1 MG/GM vaginal cream Apply one pea-sized amount around the opening of the urethra three times weekly. 42.5 g 12  . ferrous sulfate 325 (65 FE) MG EC tablet Take 1 tablet (325 mg total) by mouth 2 (two) times daily. 60 tablet 3  . furosemide (LASIX) 20 MG tablet Take 1 tablet (20 mg total) by mouth daily. 30 tablet 11  . glipiZIDE (GLUCOTROL)  5 MG tablet TAKE 1 TABLET BY MOUTH TWICE DAILY WITH MEALS (APPOINTMENT  REQUIRED  FOR  FUTURE  REFILLS) (Patient taking differently: Take 5 mg by mouth 2 (two) times daily before a meal. TAKE 1 TABLET BY MOUTH TWICE DAILY WITH MEALS (APPOINTMENT  REQUIRED  FOR  FUTURE  REFILLS)) 180 tablet 0  . insulin glargine, 2 Unit Dial, (TOUJEO MAX SOLOSTAR) 300 UNIT/ML Solostar Pen Inject 24 units daily. Titrate as instructed. Max daily dose 40 units 12 mL 1  . Insulin Pen Needle (PEN NEEDLES) 32G X 4 MM MISC 1 Units by Does not apply route at bedtime. 100 each 1  . lamoTRIgine (LAMICTAL) 200 MG tablet TAKE 2 TABLETS BY MOUTH AT BEDTIME (Patient taking differently: Take 400 mg by mouth at bedtime.) 180 tablet 1  . loperamide (IMODIUM) 2 MG capsule Take 1 capsule (2 mg total) by mouth as needed for diarrhea or loose stools. 30 capsule 0  . metFORMIN (GLUCOPHAGE) 1000 MG tablet TAKE 1 TABLET BY MOUTH TWICE DAILY WITH MEALS . APPOINTMENT REQUIRED FOR FUTURE REFILLS (Patient taking differently: Take 1,000 mg by mouth 2 (two) times daily with a meal. TAKE 1 TABLET BY MOUTH TWICE DAILY WITH MEALS . APPOINTMENT REQUIRED FOR FUTURE REFILLS) 60 tablet 0  . pantoprazole (PROTONIX) 40 MG tablet Take 1 tablet (40 mg total) by mouth daily. 30 tablet 1  . pramipexole (MIRAPEX) 1 MG tablet Take 1 tablet (1 mg total) by mouth at bedtime. 90 tablet 1  . risperiDONE (RISPERDAL) 3 MG tablet TAKE 1 TABLET BY MOUTH EVERY DAY AT BEDTIME (Patient taking differently: Take 3 mg by mouth at bedtime.) 30 tablet 5  . sertraline (ZOLOFT) 100 MG tablet TAKE 1 & 1/2 (ONE & ONE-HALF) TABLETS BY MOUTH ONCE DAILY 45 tablet 0   No current facility-administered medications on file prior to visit.   Allergies  Allergen Reactions  . Avelox [Moxifloxacin Hcl In Nacl] Anaphylaxis  . Bactrim [Sulfamethoxazole-Trimethoprim] Anaphylaxis  . Ciprofloxacin Other (See Comments)    Pt states she was told never to take this as it is in the same family as  Avelox.   . Depakote [Divalproex Sodium]   . Imitrex [Sumatriptan] Other (See Comments)    Neck and shoulder pain  . Stadol [Butorphanol] Rash    Current Medications, Allergies, Past Medical History, Past Surgical History, Family History and Social History were reviewed in [employer] record.   Review of Systems:   Constitutional: Negative for fever, sweats, chills or weight loss.  Respiratory: Negative for shortness of breath.   Cardiovascular: Negative for chest pain, palpitations and leg swelling.  Gastrointestinal: See HPI.  Musculoskeletal: Negative for back pain or muscle aches.  Neurological: Negative for dizziness, headaches or paresthesias.    Physical Exam: BP 110/64   Pulse (!) 105   Ht _0  (1.575 m)   Wt 133 lb (60.3 kg)   LMP  (LMP Unknown)   BMI 24.33 kg/m    Wt Readings from Last 3 Encounters:  07/12/20 133 lb (60.3 kg)  05/30/20 126 lb (57.2 kg)  05/26/20 134 lb 3.2 oz (60.9 kg)   General: Well developed 64 year old female in no acute distress. Head: Normocephalic and atraumatic. Eyes: No scleral icterus. Conjunctiva pink . Ears: Normal auditory acuity. Lungs: Clear throughout to auscultation. Heart: Regular rate and rhythm, no murmur. Abdomen: Soft, nontender and nondistended. No masses or hepatomegaly. Normal bowel sounds x 4 quadrants.  Rectal: Deferred. Musculoskeletal: Symmetrical with no gross deformities. Extremities: No edema. Neurological: Alert oriented x 4. No focal deficits.  Psychological: Alert and cooperative. Normal mood and affect  Assessment and Recommendations:  50. 45. 64 year old female with pancolitis s/p colonoscopy during he hospital admission 05/03/2020 showed nonspecific ileitis and colitis, suspected Crohn's disease +/- NSAID component.  Her colitis symptoms improved on a Prednisone 40 mg taper, she is down to Prednisone 5 mg daily at this time.  She was also started on Mesalamine 2.4 g p.o. daily on  05/30/2020.  She is having four softly formed to loose bowel movements daily, one solid stool QOD.  -Complete Prednisone taper -Increase Mesalamine 1.2 g four tabs p.o. daily (patient dose not wish to take 2 tabs po bid)  2.   IDA improved. Transfused 2 units of PRBCs 9/20201 at [facility] and  1 unit of PRBCs 10/2 at [facility]. She received Feraheme IV x 2  in 2018 and x 1 on 04/22/2020.  Hemoglobin 11.8 -> 12.7. -Repeat CBC, iron, iron saturation, TIBC and ferritin in 6 weeks  3. Elevated alk phos level resolved. Normal AST/ALT levels. AMA + with mildly elevated AMA 24.5. -No need for liver biopsy or MRCP at this time -Hepatic panel  Patient to follow-up in the office with Dr. Havery Moros in 6 weeks

## 2020-07-12 NOTE — Progress Notes (Signed)
Agree with assessment and plan as outlined. I will see her back in the office in 6 weeks.

## 2020-07-12 NOTE — Patient Instructions (Addendum)
If you are age 63 or older, your body mass index should be between 23-30. Your Body mass index is 24.33 kg/m. If this is out of the aforementioned range listed, please consider follow up with your Primary Care Provider.  If you are age 73 or younger, your body mass index should be between 19-25. Your Body mass index is 24.33 kg/m. If this is out of the aformentioned range listed, please consider follow up with your Primary Care Provider.   Your provider has requested that you go to the basement level for lab work before leaving today. Press "B" on the elevator. The lab is located at the first door on the left as you exit the elevator.   We have sent the following medications to your pharmacy for you to pick up at your convenience: Mesalamine 4.8 g daily.   Follow up with Dr. Havery Moros in 6-8 weeks, complete blood test before appointment.

## 2020-07-13 ENCOUNTER — Other Ambulatory Visit: Payer: Self-pay

## 2020-07-13 DIAGNOSIS — R748 Abnormal levels of other serum enzymes: Secondary | ICD-10-CM

## 2020-07-13 DIAGNOSIS — D509 Iron deficiency anemia, unspecified: Secondary | ICD-10-CM

## 2020-07-13 NOTE — Telephone Encounter (Signed)
Pt has been r/s  

## 2020-07-20 ENCOUNTER — Telehealth: Payer: Self-pay

## 2020-07-20 ENCOUNTER — Other Ambulatory Visit: Payer: Self-pay | Admitting: Physician Assistant

## 2020-08-02 ENCOUNTER — Other Ambulatory Visit: Payer: Self-pay | Admitting: Family Medicine

## 2020-08-02 DIAGNOSIS — E119 Type 2 diabetes mellitus without complications: Secondary | ICD-10-CM

## 2020-08-02 NOTE — Telephone Encounter (Signed)
Notes to clinic:  medication last filled by Amy Hansen Review for refill   Requested Prescriptions  Pending Prescriptions Disp Refills   metFORMIN (GLUCOPHAGE) 1000 MG tablet [Pharmacy Med Name: metFORMIN HCl 1000 MG Oral Tablet] 60 tablet 0    Sig: TAKE 1 TABLET BY MOUTH TWICE DAILY WITH MEALS . APPOINTMENT REQUIRED FOR FUTURE REFILLS      Endocrinology:  Diabetes - Biguanides Passed - 08/02/2020  5:30 AM      Passed - Cr in normal range and within 360 days    Creatinine  Date Value Ref Range Status  01/10/2012 0.58 (L) 0.60 - 1.30 mg/dL Final   Creatinine, Ser  Date Value Ref Range Status  05/26/2020 0.61 0.57 - 1.00 mg/dL Final          Passed - HBA1C is between 0 and 7.9 and within 180 days    Hemoglobin A1C  Date Value Ref Range Status  03/21/2016 7.3%  Final   HB A1C (BAYER DCA - WAIVED)  Date Value Ref Range Status  04/21/2020 7.7 (H) <7.0 % Final    Comment:                                          Diabetic Adult            <7.0                                       Healthy Adult        4.3 - 5.7                                                           (DCCT/NGSP) American Diabetes Association's Summary of Glycemic Recommendations for Adults with Diabetes: Hemoglobin A1c <7.0%. More stringent glycemic goals (A1c <6.0%) may further reduce complications at the cost of increased risk of hypoglycemia.           Passed - eGFR in normal range and within 360 days    EGFR (African American)  Date Value Ref Range Status  01/10/2012 >60  Final   GFR calc Af Amer  Date Value Ref Range Status  05/26/2020 112 >59 mL/min/1.73 Final    Comment:    **In accordance with recommendations from the NKF-ASN Task force,**   [facility] is in the process of updating its eGFR calculation to the   2021 CKD-EPI creatinine equation that estimates kidney function   without a race variable.    EGFR (Non-African Amer.)  Date Value Ref Range Status  01/10/2012 >60  Final    Comment:     eGFR values <65m/min/1.73 m2 may be an indication of chronic kidney disease (CKD). Calculated eGFR is useful in patients with stable renal function. The eGFR calculation will not be reliable in acutely ill patients when serum creatinine is changing rapidly. It is not useful in  patients on dialysis. The eGFR calculation may not be applicable to patients at the low and high extremes of body sizes, pregnant women, and vegetarians.    GFR calc non Af Amer  Date Value Ref Range Status  05/26/2020 97 >  59 mL/min/1.73 Final          Passed - Valid encounter within last 6 months    Recent Outpatient Visits           2 months ago Poorly controlled type 2 diabetes mellitus ([place])   [facility] Eulogio Bear, NP   2 months ago Hospital discharge follow-up   [facility] Eulogio Bear, NP   3 months ago Poorly controlled type 2 diabetes mellitus [facility])   [facility] Eulogio Bear, NP   7 months ago Poorly controlled type 2 diabetes mellitus [facility])   [facility] [place], [place]   7 months ago [place], [place], [place]

## 2020-08-02 NOTE — Telephone Encounter (Signed)
Last seen in October

## 2020-08-02 NOTE — Telephone Encounter (Signed)
On recall list

## 2020-08-18 ENCOUNTER — Telehealth: Payer: Self-pay

## 2020-08-18 NOTE — Telephone Encounter (Signed)
-----   Message from Hughie Closs, RN sent at 07/13/2020  3:21 PM EST ----- Call pt. To come in for repeat labs in 6 weeks = 08/24/20. Orders in [place]

## 2020-08-18 NOTE — Telephone Encounter (Signed)
Called and left message on patient's voice mail to please come into our lab in the basement for repeat labs. Let her know she can come in Mon.-Fri. Between 7:30am-4:30pm, and to please call back at [phone] if  any questions.

## 2020-08-23 ENCOUNTER — Other Ambulatory Visit: Payer: Self-pay | Admitting: Physician Assistant

## 2020-08-23 ENCOUNTER — Other Ambulatory Visit (INDEPENDENT_AMBULATORY_CARE_PROVIDER_SITE_OTHER): Payer: Self-pay

## 2020-08-23 DIAGNOSIS — D509 Iron deficiency anemia, unspecified: Secondary | ICD-10-CM

## 2020-08-23 DIAGNOSIS — R748 Abnormal levels of other serum enzymes: Secondary | ICD-10-CM

## 2020-08-23 LAB — CBC WITH DIFFERENTIAL/PLATELET
Basophils Absolute: 0 10*3/uL (ref 0.0–0.1)
Basophils Relative: 0.3 % (ref 0.0–3.0)
Eosinophils Absolute: 0.1 10*3/uL (ref 0.0–0.7)
Eosinophils Relative: 1.1 % (ref 0.0–5.0)
HCT: 37.2 % (ref 36.0–46.0)
Hemoglobin: 12.2 g/dL (ref 12.0–15.0)
Lymphocytes Relative: 25.6 % (ref 12.0–46.0)
Lymphs Abs: 1.2 10*3/uL (ref 0.7–4.0)
MCHC: 32.7 g/dL (ref 30.0–36.0)
MCV: 88.8 fl (ref 78.0–100.0)
Monocytes Absolute: 0.3 10*3/uL (ref 0.1–1.0)
Monocytes Relative: 6.6 % (ref 3.0–12.0)
Neutro Abs: 3.2 10*3/uL (ref 1.4–7.7)
Neutrophils Relative %: 66.4 % (ref 43.0–77.0)
Platelets: 357 10*3/uL (ref 150.0–400.0)
RBC: 4.19 Mil/uL (ref 3.87–5.11)
RDW: 13.9 % (ref 11.5–15.5)
WBC: 4.8 10*3/uL (ref 4.0–10.5)

## 2020-08-23 LAB — HEPATIC FUNCTION PANEL
ALT: 21 U/L (ref 0–35)
AST: 15 U/L (ref 0–37)
Albumin: 3.8 g/dL (ref 3.5–5.2)
Alkaline Phosphatase: 99 U/L (ref 39–117)
Bilirubin, Direct: 0.1 mg/dL (ref 0.0–0.3)
Total Bilirubin: 0.2 mg/dL (ref 0.2–1.2)
Total Protein: 6.5 g/dL (ref 6.0–8.3)

## 2020-08-23 NOTE — Telephone Encounter (Signed)
Next apt 09/02/20

## 2020-08-24 ENCOUNTER — Other Ambulatory Visit: Payer: Self-pay | Admitting: Physician Assistant

## 2020-08-24 LAB — IRON,TIBC AND FERRITIN PANEL
%SAT: 22 % (calc) (ref 16–45)
Ferritin: 27 ng/mL (ref 16–288)
Iron: 74 ug/dL (ref 45–160)
TIBC: 343 mcg/dL (calc) (ref 250–450)

## 2020-08-26 ENCOUNTER — Telehealth: Payer: Self-pay | Admitting: General Practice

## 2020-08-26 ENCOUNTER — Ambulatory Visit: Payer: Self-pay | Admitting: General Practice

## 2020-08-26 DIAGNOSIS — I10 Essential (primary) hypertension: Secondary | ICD-10-CM

## 2020-08-26 DIAGNOSIS — K529 Noninfective gastroenteritis and colitis, unspecified: Secondary | ICD-10-CM

## 2020-08-26 DIAGNOSIS — R197 Diarrhea, unspecified: Secondary | ICD-10-CM

## 2020-08-26 DIAGNOSIS — E1165 Type 2 diabetes mellitus with hyperglycemia: Secondary | ICD-10-CM

## 2020-08-26 NOTE — Patient Instructions (Signed)
Visit Information  Goals Addressed              This Visit's Progress   .  COMPLETED: RNCM: Pt- "Today my blood sugar was 465" (pt-stated)        CARE PLAN ENTRY (see longtitudinal plan of care for additional care plan information)  Objective: Closing this goal and opening in new ELS  Lab Results  Component Value Date   HGBA1C 7.7 (H) 04/21/2020      Lab Results  Component Value Date   CREATININE 0.51 05/03/2020   BUN <5 (L) 05/03/2020   NA 137 05/03/2020   K 3.0 (L) 05/03/2020   CL 104 05/03/2020   CO2 24 05/03/2020    . No results found for: EGFR  Current Barriers:  [name] Knowledge Deficits related to basic Diabetes pathophysiology and self care/management . Knowledge Deficits related to medications used for management of diabetes . Difficulty obtaining or cannot afford medications . Does not have glucometer to monitor blood sugar- has access to a meter to take blood sugar readings . Film/video editor . Cognitive Deficits . Limited Social Support  Case Manager Clinical Goal(s):  Over the next 120 days, patient will demonstrate improved adherence to prescribed treatment plan for diabetes self care/management as evidenced by:  . daily monitoring and recording of CBG  . adherence to ADA/ carb modified diet . adherence to prescribed medication regimen  Interventions:  . Provided education to patient about basic DM disease process . Reviewed medications with patient and discussed importance of medication adherence. 05-06-2020: The patient is taking prednisone right now so her blood sugars are a little elevated at 230.  Education and support.  . Discussed plans with patient for ongoing care management follow up and provided patient with direct contact information for care management team . Provided patient with written educational materials related to hypo and hyperglycemia and importance of correct treatment. Sending information by my Chart and the EMMI system.  05-06-2020: the patient is doing better at managing her DM. Praised for [employer].  . Reviewed scheduled/upcoming provider appointments including: 05-06-2020 with pcp. Reminded the patient to take her paperwork with her from recent hospitalization and questions she has for the pcp.  . Reviewed with the patient the effects uncontrolled diabetes can have on the eyes, kidneys, and other organ systems.  . Advised patient, providing education and rationale, to check cbg 2 times or more daily and record, calling pcp for findings outside established parameters.  05-06-2020: The patient is checking blood sugars. Her sugars had been doing really good, she is having some higher readings right now but is on prednisone. The patient educated on prednisone causing blood sugars to be elevated. The patient had gotten her hemoglobin A1C down to 7.7. Praised for accomplishments.  . Provided a calendar/record book for recording blood sugars and samples with coupons for Glucerna for the patient at the front desk. She will come by and pick up the book and Glucerna samples.  . Referral for LCSW due to depression and help with coping mechanisms. Will alert the CCM pharmacist for help with the management of medications and new diagnosis of chron's disease.   Patient Self Care Activities:  . UNABLE to independently manage diabetes. Hemoglobin A1C was >14 on 12-14-2019; Hemoglobin A1C on 04-21-2020 7.7% . Self administers oral medications as prescribed . Self administers insulin as prescribed . Checks blood sugars as prescribed and utilize hyper and hypoglycemia protocol as needed . Adheres to prescribed ADA/carb modified  Please  see past updates related to this goal by clicking on the "Past Updates" button in the selected goal      .  COMPLETED: RNCM: Pt-"I have a way of checking my blood pressure" "I do not weigh daily" (pt-stated)        CARE PLAN ENTRY (see longtitudinal plan of care for additional care plan  information)  Current Barriers: Closing this goal and opening in new ELS . Chronic Disease Management support, education, and care coordination needs related to CHF, HTN, Depression, with new diagnosis of Chron's and IBS with recent hospitalization in September of 2021  Clinical Goal(s) related to : CHF, HTN, Depression, with new diagnosis of Chron's and IBS with recent hospitalization in September of 2021 Over the next 120 days, patient will:  . Work with the care management team to address educational, disease management, and care coordination needs  . Begin or continue self health monitoring activities as directed today Measure and record blood pressure 2 times per week, Measure and record weight daily, and adhere to a heart healthy/ADA diet. Also to report changes related to Chron's and IBS.  . Call provider office for new or worsened signs and symptoms Blood pressure findings outside established parameters, Weight outside established parameters, and New or worsened symptom related to depression or other chronic health conditions . Call care management team with questions or concerns . Verbalize basic understanding of patient centered plan of care established today  Interventions related to : CHF, HTN, Depression, with new diagnosis of Chron's and IBS with recent hospitalization in September of 2021 . Evaluation of current treatment plans and patient's adherence to plan as established by provider.  05-06-2020: The patient has a post hospitalization visit with the pcp today. She is concerned about her recent hospitalization. She has a new diagnosis of Chron's and IBS. She is doing well post hospitalization and says the medication she is on is helping a lot. She is experiencing elevated blood sugars due to the prednisone she is taking. Education on taking discharge paperwork with her to appointment today and follow recommendations of the provider.  . Assessed patient understanding of disease states.   05-06-2020: The patient feels like she has some answers now. She does not know how long she will be out of work but she wants to manage her health and well being. The patient also spoke to the LCSW today to help with management of her depression and the impact of her new diagnosis.  She is thankful for the CCM support and actually reached out to the [facility] asking for assistance.  . Assessed patient's education and care coordination needs.  Education on the benefits of effective management of chronic conditions. The importance of daily weights even with preserved heart failure. The patient does have a way of checking her blood pressure and will start doing this on a consistent basis.  . Provided disease specific education to patient. Educational information sent to the patient by my Chart and EMMI on chronic conditions and effective management. 05-06-2020: Will also send new information on Chron;s and IBS.  [name] Collaborated with appropriate clinical care team members regarding patient needs.  Pharm D spoke to the patient today. A referral made to the LCSW to assist with depression. The patient denies any suicidal ideation/harm of self or others. The RNCM was concerned since she has been off of her medications of  changes in mood/mental status. 05-06-2020: The LCSW is working with the patient. Will ask the pharmacist to follow up  with the patient since she has a new diagnosis.   Patient Self Care Activities related to : CHF, HTN, Depression, with new diagnosis of Chron's and IBS with recent hospitalization in September of 2021 . Patient is unable to independently self-manage chronic health conditions  Please see past updates related to this goal by clicking on the "Past Updates" button in the selected goal          Patient Care Plan: RNCM: Diabetes Type 2 (Adult)    Problem Identified: RNCM: Glycemic Management (Diabetes, Type 2)   Priority: Medium    Goal: RNCM: Glycemic Management Optimized   Priority:  Medium  Note:   Objective:  Lab Results  Component Value Date   HGBA1C 7.7 (H) 04/21/2020 .   Lab Results  Component Value Date   CREATININE 0.61 05/26/2020   CREATININE 0.58 05/06/2020   CREATININE 0.51 05/03/2020 .   [name] No results found for: EGFR Current Barriers:  [name] Knowledge Deficits related to basic Diabetes pathophysiology and self care/management . Knowledge Deficits related to medications used for management of diabetes . Limited Social Support . Unable to independently manage DM . Does not contact provider office for questions/concerns Case Manager Clinical Goal(s):  [name] Collaboration with [name], NP regarding development and update of comprehensive plan of care as evidenced by provider attestation and co-signature . Inter-disciplinary care team collaboration (see longitudinal plan of care) . Over the next 120 days, patient will demonstrate improved adherence to prescribed treatment plan for diabetes self care/management as evidenced by:  . daily monitoring and recording of CBG  . adherence to ADA/ carb modified diet . exercise 2/3 days/week . adherence to prescribed medication regimen Interventions:  . Provided education to patient about basic DM disease process . Reviewed medications with patient and discussed importance of medication adherence . Discussed plans with patient for ongoing care management follow up and provided patient with direct contact information for care management team . Provided patient with written educational materials related to hypo and hyperglycemia and importance of correct treatment . Reviewed scheduled/upcoming provider appointments including: encouraged the patient to call and get an appointment to see pcp . Advised patient, providing education and rationale, to check cbg daily  and record, calling pcp for findings outside established parameters.   . Review of patient status, including review of consultants reports, relevant  laboratory and other test results, and medications completed. Patient Goals/Self-Care Activities . Over the next 120 days, patient will:  - Self administers oral medications as prescribed Self administers insulin as prescribed Attends all scheduled provider appointments Checks blood sugars as prescribed and utilize hyper and hypoglycemia protocol as needed Adheres to prescribed ADA/carb modified - barriers to adherence to treatment plan identified - blood glucose monitoring encouraged - blood glucose readings reviewed - mutual A1C goal set or reviewed - resources required to improve adherence to care identified - self-awareness of signs/symptoms of hypo or hyperglycemia encouraged - use of blood glucose monitoring log promoted Follow Up Plan: Telephone follow up appointment with care management team member scheduled for: 10-19-2020 at 0900 am   Task: RNCM: Alleviate Barriers to Glycemic Management   Note:   Care Management Activities:    - barriers to adherence to treatment plan identified - blood glucose monitoring encouraged - blood glucose readings reviewed - mutual A1C goal set or reviewed - resources required to improve adherence to care identified - self-awareness of signs/symptoms of hypo or hyperglycemia encouraged - use of blood glucose monitoring log promoted  Patient Care Plan: RNCM: Hypertension (Adult)    Problem Identified: RNCM: Hypertension (Hypertension)   Priority: Medium    Goal: RNCM: Hypertension Monitored   Priority: Medium  Note:   Objective:  . Last practice recorded BP readings:  BP Readings from Last 3 Encounters:  07/12/20 110/64  05/30/20 108/60  05/26/20 111/71 .   [name] Most recent eGFR/CrCl: No results found for: EGFR  No components found for: CRCL Current Barriers:  [name] Knowledge Deficits related to basic understanding of hypertension pathophysiology and self care management . Knowledge Deficits related to understanding of medications  prescribed for management of hypertension . Limited Social Support . Unable to self administer medications as prescribed . Lacks social connections . Does not contact provider office for questions/concerns Case Manager Clinical Goal(s):  [name] Over the next 120 days, patient will verbalize understanding of plan for hypertension management . Over the next 120 days, patient will demonstrate improved adherence to prescribed treatment plan for hypertension as evidenced by taking all medications as prescribed, monitoring and recording blood pressure as directed, adhering to low sodium/DASH diet . Over the next 120 days, patient will demonstrate improved health management independence as evidenced by checking blood pressure as directed and notifying PCP if SBP>160 or DBP > 90, taking all medications as prescribe, and adhering to a low sodium diet as discussed. Interventions:  . Collaboration with [name], NP regarding development and update of comprehensive plan of care as evidenced by provider attestation and co-signature . Inter-disciplinary care team collaboration (see longitudinal plan of care) . Evaluation of current treatment plan related to hypertension self management and patient's adherence to plan as established by provider. . Provided education to patient re: stroke prevention, s/s of heart attack and stroke, DASH diet, complications of uncontrolled blood pressure . Reviewed medications with patient and discussed importance of compliance . Discussed plans with patient for ongoing care management follow up and provided patient with direct contact information for care management team . Advised patient, providing education and rationale, to monitor blood pressure daily and record, calling PCP for findings outside established parameters.  Patient Goals/Self-Care Activities . Over the next 120 days, patient will:  - Self administers medications as prescribed Attends all scheduled provider  appointments Calls provider office for new concerns, questions, or BP outside discussed parameters Checks BP and records as discussed Follows a low sodium diet/DASH diet - blood pressure trends reviewed - depression screen reviewed - home or ambulatory blood pressure monitoring encouraged Follow Up Plan: Telephone follow up appointment with care management team member scheduled for: 10-18-2020 at 0900 am   Task: RNCM: Identify and Monitor Blood Pressure Elevation   Note:   Care Management Activities:    - blood pressure trends reviewed - depression screen reviewed - home or ambulatory blood pressure monitoring encouraged       Patient Care Plan: RNCM: Effective management of Colitis/Chron's    Problem Identified: RNCM: Health Promotion or Disease Self-Management (General Plan of Care) Colitis and Chron's management   Priority: High    Goal: RNCM: Self-Management Plan Developed   Priority: High  Note:   Current Barriers:  [name] Knowledge Deficits related to resources and education on effective management of colitis and chron's  . Chronic Disease Management support and education needs related to colitis and chron's . Lacks caregiver support.  . Unable to independently manage exacerbations of colitis/chron's . Lacks social connections . Does not contact provider office for questions/concerns  Nurse Case Manager Clinical Goal(s):  [name] Over  the next 120 days, patient will verbalize understanding of plan for effective management of chron's and colitis  . Over the next 120 days, patient will work with [place], and pcp to address needs related to Management of chron's and colitis . Over the next 120 days, patient will demonstrate a decrease in colitis/chron's  exacerbations as evidenced by effective management of sx/sx, taking medications as prescribed, managing diet, and keeping appointments  . Over the next 120 days, patient will verbalize basic understanding of colitis/chron's  disease  process and self health management plan as evidenced by no acute exacerbations and stabilization of disease process  Interventions:  . 1:1 collaboration with [name], NP regarding development and update of comprehensive plan of care as evidenced by provider attestation and co-signature . Inter-disciplinary care team collaboration (see longitudinal plan of care) . Evaluation of current treatment plan related to effective management of colitis and chron's disease  and patient's adherence to plan as established by provider. . Advised patient to call the provider for changes in condition, worsening sx/sx or exacerbations  . Provided education to patient re: dietary support with colitis and chron's . Discussed plans with patient for ongoing care management follow up and provided patient with direct contact information for care management team  Patient Goals/Self-Care Activities Over the next 120 days, patient will:  - Patient will self administer medications as prescribed Patient will attend all scheduled provider appointments Patient will call pharmacy for medication refills Patient will call provider office for new concerns or questions Patient will work with BSW to address care coordination needs and will continue to work with the clinical team to address health care and disease management related needs.   - barriers to meeting goals identified - choices provided - collaboration with team encouraged - decision-making supported - health risks reviewed - problem-solving facilitated - questions answered - readiness for change evaluated - reassurance provided - resources needed to meet goals identified - self-reflection promoted - self-reliance encouraged - verbalization of feelings encouraged  Follow Up Plan: Telephone follow up appointment with care management team member scheduled for: 10-19-2020 at 0900       Task: RNCM: Mutually Develop and Royce Macadamia Achievement of Patient  Goals   Note:   Care Management Activities:    - barriers to meeting goals identified - choices provided - collaboration with team encouraged - decision-making supported - health risks reviewed - problem-solving facilitated - questions answered - readiness for change evaluated - reassurance provided - resources needed to meet goals identified - self-reflection promoted - self-reliance encouraged - verbalization of feelings encouraged    Notes: 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   Telephone follow up appointment with care management team member scheduled for: 10-19-2020 at 0900 am  Noreene Larsson RN, MSN, [facility] Mobile: [phone]

## 2020-08-26 NOTE — Telephone Encounter (Signed)
  Chronic Care Management   Note  08/26/2020 Name: Amy Hansen MRN: [id number] DOB: 1955-12-07  The patient called and left a VM. The RNCM was able to call the patient back and completed the outreach. See new encounter.   Follow up plan: Telephone follow up appointment with care management team member scheduled for: 10-19-2020 at 0900 am  Noreene Larsson RN, MSN, [facility] Mobile: [phone]

## 2020-08-26 NOTE — Chronic Care Management (AMB) (Signed)
Care Management    RN Visit Note  08/26/2020 Name: Amy Hansen MRN: [id number] DOB: [birth year]  Subjective: Amy Hansen is a 65 y.o. year old female who is a primary care patient of Amy Billings, NP. The care management team was consulted for assistance with disease management and care coordination needs.    Engaged with patient by telephone for follow up visit in response to provider referral for case management and/or care coordination services.   Consent to Services:   Amy Hansen was given information about Care Management services today including:  1. Care Management services includes personalized support from designated clinical staff supervised by her physician, including individualized plan of care and coordination with other care providers 2. 24/7 contact phone numbers for assistance for urgent and routine care needs. 3. The patient may stop case management services at any time by phone call to the office staff.  Patient agreed to services and consent obtained.   Assessment: Review of patient past medical history, allergies, medications, health status, including review of consultants reports, laboratory and other test data, was performed as part of comprehensive evaluation and provision of chronic care management services.   SDOH (Social Determinants of Health) assessments and interventions performed:    Care Plan  Allergies  Allergen Reactions   Avelox [Moxifloxacin Hcl In Nacl] Anaphylaxis   Bactrim [Sulfamethoxazole-Trimethoprim] Anaphylaxis   Ciprofloxacin Other (See Comments)    Pt states she was told never to take this as it is in the same family as Avelox.    Depakote [Divalproex Sodium]    Imitrex [Sumatriptan] Other (See Comments)    Neck and shoulder pain   Stadol [Butorphanol] Rash    Outpatient Encounter Medications as of 08/26/2020  Medication Sig   ALPRAZolam (XANAX) 0.5 MG tablet TAKE 1 TABLET BY MOUTH 4 TIMES DAILY AS NEEDED FOR ANXIETY    buPROPion (WELLBUTRIN XL) 150 MG 24 hr tablet Take 3 tablets by mouth once daily   Cholecalciferol (VITAMIN D3) 1.25 MG (50000 UT) CAPS Take 1 capsule by mouth once a week.   estradiol (ESTRACE) 0.1 MG/GM vaginal cream Apply one pea-sized amount around the opening of the urethra three times weekly.   ferrous sulfate 325 (65 FE) MG EC tablet Take 1 tablet (325 mg total) by mouth 2 (two) times daily.   furosemide (LASIX) 20 MG tablet Take 1 tablet (20 mg total) by mouth daily.   glipiZIDE (GLUCOTROL) 5 MG tablet TAKE 1 TABLET BY MOUTH TWICE DAILY WITH MEALS (APPOINTMENT  REQUIRED  FOR  FUTURE  REFILLS) (Patient taking differently: Take 5 mg by mouth 2 (two) times daily before a meal. TAKE 1 TABLET BY MOUTH TWICE DAILY WITH MEALS (APPOINTMENT  REQUIRED  FOR  FUTURE  REFILLS))   insulin glargine, 2 Unit Dial, (TOUJEO MAX SOLOSTAR) 300 UNIT/ML Solostar Pen Inject 24 units daily. Titrate as instructed. Max daily dose 40 units   Insulin Pen Needle (PEN NEEDLES) 32G X 4 MM MISC 1 Units by Does not apply route at bedtime.   lamoTRIgine (LAMICTAL) 200 MG tablet TAKE 2 TABLETS BY MOUTH AT BEDTIME (Patient taking differently: Take 400 mg by mouth at bedtime.)   loperamide (IMODIUM) 2 MG capsule Take 1 capsule (2 mg total) by mouth as needed for diarrhea or loose stools.   mesalamine (LIALDA) 1.2 g EC tablet Take 4 tablets (4.8 g total) by mouth daily with breakfast.   metFORMIN (GLUCOPHAGE) 1000 MG tablet TAKE 1 TABLET BY MOUTH TWICE DAILY WITH MEALS  APPOINTMENT  REQUIRED  FOR  FUTURE REFILLS   nystatin (MYCOSTATIN/NYSTOP) powder Apply 1 application topically 3 (three) times daily. Apply to lower abdominal fold tid x 2 to 3 weeks   pantoprazole (PROTONIX) 40 MG tablet Take 1 tablet (40 mg total) by mouth daily.   pramipexole (MIRAPEX) 1 MG tablet Take 1 tablet (1 mg total) by mouth at bedtime.   risperiDONE (RISPERDAL) 3 MG tablet TAKE 1 TABLET BY MOUTH EVERY DAY AT BEDTIME (Patient taking  differently: Take 3 mg by mouth at bedtime.)   sertraline (ZOLOFT) 100 MG tablet TAKE 1 & 1/2 (ONE & ONE-HALF) TABLETS BY MOUTH ONCE DAILY   No facility-administered encounter medications on file as of 08/26/2020.    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   Type 2 diabetes mellitus ([place]) 04/25/2020   Colitis 04/21/2020   AKI (acute kidney injury) ([place]) 04/21/2020   Major depression, chronic 06/01/2018   OCD (obsessive compulsive disorder) 06/01/2018   Chronic heart failure with preserved ejection fraction ([place]) 05/16/2018   Left leg pain 11/04/2017   GERD (gastroesophageal reflux disease) 02/18/2017   Iron deficiency anemia 10/18/2016   Leg swelling 07/17/2016   Hypertension 03/21/2016   Vitamin D deficiency 02/21/2016   B12 deficiency 02/21/2016   Poorly controlled type 2 diabetes mellitus ([place]) 09/13/2015   Migraines 05/23/2015   Restless legs syndrome (RLS) 05/23/2015   Recurrent UTI 04/26/2015   Atrophic vaginitis 04/26/2015   Dyspareunia, female 04/26/2015    Conditions to be addressed/monitored: HTN, DMII and colitis/chron's disease  Care Plan : Depression (Adult)  Updates made by Amy Hansen since 08/26/2020 12:00 AM    Care Plan : RNCM: Diabetes Type 2 (Adult)  Updates made by Amy Hansen since 08/26/2020 12:00 AM    Problem: RNCM: Glycemic Management (Diabetes, Type 2)   Priority: Medium    Goal: RNCM: Glycemic Management Optimized   Priority: Medium  Note:   Objective:  Lab Results  Component Value Date   HGBA1C 7.7 (H) 04/21/2020    Lab Results  Component Value Date   CREATININE 0.61 05/26/2020   CREATININE 0.58 05/06/2020   CREATININE 0.51 05/03/2020     No results found for: EGFR Current Barriers:   Knowledge Deficits related to basic Diabetes pathophysiology  and self care/management  Knowledge Deficits related to medications used for management of diabetes  Limited Social Support  Unable to independently manage DM  Does not contact provider office for questions/concerns Case Manager Clinical Goal(s):   Collaboration with Amy Billings, NP regarding development and update of comprehensive plan of care as evidenced by provider attestation and co-signature  Inter-disciplinary care team collaboration (see longitudinal plan of care)  Over the next 120 days, patient will demonstrate improved adherence to prescribed treatment plan for diabetes self care/management as evidenced by:   daily monitoring and recording of CBG   adherence to ADA/ carb modified diet  exercise 2/3 days/week  adherence to prescribed medication regimen Interventions:   Provided education to patient about basic DM disease process  Reviewed medications with patient and discussed importance of medication adherence  Discussed plans with patient for ongoing care management follow up and provided patient with direct contact information for care management team  Provided patient with written educational materials related to hypo and hyperglycemia and importance of correct treatment  Reviewed  scheduled/upcoming provider appointments including: encouraged the patient to call and get an appointment to see pcp  Advised patient, providing education and rationale, to check cbg daily  and record, calling pcp for findings outside established parameters.    Review of patient status, including review of consultants reports, relevant laboratory and other test results, and medications completed. Patient Goals/Self-Care Activities  Over the next 120 days, patient will:  - Self administers oral medications as prescribed Self administers insulin as prescribed Attends all scheduled provider appointments Checks blood sugars as prescribed and utilize hyper and hypoglycemia  protocol as needed Adheres to prescribed ADA/carb modified - barriers to adherence to treatment plan identified - blood glucose monitoring encouraged - blood glucose readings reviewed - mutual A1C goal set or reviewed - resources required to improve adherence to care identified - self-awareness of signs/symptoms of hypo or hyperglycemia encouraged - use of blood glucose monitoring log promoted Follow Up Plan: Telephone follow up appointment with care management team member scheduled for: 10-19-2020 at 0900 am   Task: RNCM: Alleviate Barriers to Glycemic Management   Note:   Care Management Activities:    - barriers to adherence to treatment plan identified - blood glucose monitoring encouraged - blood glucose readings reviewed - mutual A1C goal set or reviewed - resources required to improve adherence to care identified - self-awareness of signs/symptoms of hypo or hyperglycemia encouraged - use of blood glucose monitoring log promoted     Care Plan : RNCM: Hypertension (Adult)  Updates made by Amy Hansen since 08/26/2020 12:00 AM    Problem: RNCM: Hypertension (Hypertension)   Priority: Medium    Goal: RNCM: Hypertension Monitored   Priority: Medium  Note:   Objective:   Last practice recorded BP readings:  BP Readings from Last 3 Encounters:  07/12/20 110/64  05/30/20 108/60  05/26/20 111/71     Most recent eGFR/CrCl: No results found for: EGFR  No components found for: CRCL Current Barriers:   Knowledge Deficits related to basic understanding of hypertension pathophysiology and self care management  Knowledge Deficits related to understanding of medications prescribed for management of hypertension  Limited Social Support  Unable to self administer medications as prescribed  Lacks social connections  Does not contact provider office for questions/concerns Case Manager Clinical Goal(s):   Over the next 120 days, patient will verbalize understanding of  plan for hypertension management  Over the next 120 days, patient will demonstrate improved adherence to prescribed treatment plan for hypertension as evidenced by taking all medications as prescribed, monitoring and recording blood pressure as directed, adhering to low sodium/DASH diet  Over the next 120 days, patient will demonstrate improved health management independence as evidenced by checking blood pressure as directed and notifying PCP if SBP>160 or DBP > 90, taking all medications as prescribe, and adhering to a low sodium diet as discussed. Interventions:   Collaboration with Amy Billings, NP regarding development and update of comprehensive plan of care as evidenced by provider attestation and co-signature  Inter-disciplinary care team collaboration (see longitudinal plan of care)  Evaluation of current treatment plan related to hypertension self management and patient's adherence to plan as established by provider.  Provided education to patient re: stroke prevention, s/s of heart attack and stroke, DASH diet, complications of uncontrolled blood pressure  Reviewed medications with patient and discussed importance of compliance  Discussed plans with patient for ongoing care management follow up and provided patient with direct contact information for care management team  Advised  patient, providing education and rationale, to monitor blood pressure daily and record, calling PCP for findings outside established parameters.  Patient Goals/Self-Care Activities  Over the next 120 days, patient will:  - Self administers medications as prescribed Attends all scheduled provider appointments Calls provider office for new concerns, questions, or BP outside discussed parameters Checks BP and records as discussed Follows a low sodium diet/DASH diet - blood pressure trends reviewed - depression screen reviewed - home or ambulatory blood pressure monitoring encouraged Follow Up  Plan: Telephone follow up appointment with care management team member scheduled for: 10-18-2020 at 0900 am   Task: RNCM: Identify and Monitor Blood Pressure Elevation   Note:   Care Management Activities:    - blood pressure trends reviewed - depression screen reviewed - home or ambulatory blood pressure monitoring encouraged       Care Plan : RNCM: Effective management of Colitis/Chron's  Updates made by Amy Hansen since 08/26/2020 12:00 AM    Problem: RNCM: Health Promotion or Disease Self-Management (General Plan of Care) Colitis and Chron's management   Priority: High    Goal: RNCM: Self-Management Plan Developed   Priority: High  Note:   Current Barriers:   Knowledge Deficits related to resources and education on effective management of colitis and chron's   Chronic Disease Management support and education needs related to colitis and chron's  Lacks caregiver support.   Unable to independently manage exacerbations of colitis/chron's  Lacks social connections  Does not contact provider office for questions/concerns  Nurse Case Manager Clinical Goal(s):   Over the next 120 days, patient will verbalize understanding of plan for effective management of chron's and colitis   Over the next 120 days, patient will work with [place], and pcp to address needs related to Management of chron's and colitis  Over the next 120 days, patient will demonstrate a decrease in colitis/chron's  exacerbations as evidenced by effective management of sx/sx, taking medications as prescribed, managing diet, and keeping appointments   Over the next 120 days, patient will verbalize basic understanding of colitis/chron's  disease process and self health management plan as evidenced by no acute exacerbations and stabilization of disease process  Interventions:   1:1 collaboration with Amy Billings, NP regarding development and update of comprehensive plan of care as evidenced by provider  attestation and co-signature  Inter-disciplinary care team collaboration (see longitudinal plan of care)  Evaluation of current treatment plan related to effective management of colitis and chron's disease  and patient's adherence to plan as established by provider.  Advised patient to call the provider for changes in condition, worsening sx/sx or exacerbations   Provided education to patient re: dietary support with colitis and chron's  Discussed plans with patient for ongoing care management follow up and provided patient with direct contact information for care management team  Patient Goals/Self-Care Activities Over the next 120 days, patient will:  - Patient will self administer medications as prescribed Patient will attend all scheduled provider appointments Patient will call pharmacy for medication refills Patient will call provider office for new concerns or questions Patient will work with BSW to address care coordination needs and will continue to work with the clinical team to address health care and disease management related needs.   - barriers to meeting goals identified - choices provided - collaboration with team encouraged - decision-making supported - health risks reviewed - problem-solving facilitated - questions answered - readiness for change evaluated - reassurance provided - resources needed to  meet goals identified - self-reflection promoted - self-reliance encouraged - verbalization of feelings encouraged  Follow Up Plan: Telephone follow up appointment with care management team member scheduled for: 10-19-2020 at 0900       Task: RNCM: Mutually Develop and Royce Macadamia Achievement of Patient Goals   Note:   Care Management Activities:    - barriers to meeting goals identified - choices provided - collaboration with team encouraged - decision-making supported - health risks reviewed - problem-solving facilitated - questions answered - readiness for  change evaluated - reassurance provided - resources needed to meet goals identified - self-reflection promoted - self-reliance encouraged - verbalization of feelings encouraged    Notes: 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

## 2020-08-29 ENCOUNTER — Telehealth: Payer: Self-pay

## 2020-08-29 ENCOUNTER — Telehealth: Payer: Self-pay | Admitting: Licensed Clinical Social Worker

## 2020-08-29 NOTE — Telephone Encounter (Signed)
Chronic Care Management    Clinical Social Work General Follow Up Note  08/29/2020 Name: Amy Hansen MRN: [id number] DOB: 1956-06-16  Amy Hansen is a 65 y.o. year old female who is a primary care patient of [name], NP. The CCM team was consulted for assistance with [employer] .   Review of patient status, including review of consultants reports, relevant laboratory and other test results, and collaboration with appropriate care team members and the patient's provider was performed as part of comprehensive patient evaluation and provision of chronic care management services.    LCSW completed CCM outreach attempt today but was unable to reach patient successfully. A HIPPA compliant voice message was left encouraging patient to return call once available. LCSW will ask Scheduling Care Guide to reschedule CCM SW appointment with patient as well.  Outpatient Encounter Medications as of 08/29/2020  Medication Sig  . ALPRAZolam (XANAX) 0.5 MG tablet TAKE 1 TABLET BY MOUTH 4 TIMES DAILY AS NEEDED FOR ANXIETY  . buPROPion (WELLBUTRIN XL) 150 MG 24 hr tablet Take 3 tablets by mouth once daily  . Cholecalciferol (VITAMIN D3) 1.25 MG (50000 UT) CAPS Take 1 capsule by mouth once a week.  . estradiol (ESTRACE) 0.1 MG/GM vaginal cream Apply one pea-sized amount around the opening of the urethra three times weekly.  . ferrous sulfate 325 (65 FE) MG EC tablet Take 1 tablet (325 mg total) by mouth 2 (two) times daily.  . furosemide (LASIX) 20 MG tablet Take 1 tablet (20 mg total) by mouth daily.  [name] glipiZIDE (GLUCOTROL) 5 MG tablet TAKE 1 TABLET BY MOUTH TWICE DAILY WITH MEALS (APPOINTMENT  REQUIRED  FOR  FUTURE  REFILLS) (Patient taking differently: Take 5 mg by mouth 2 (two) times daily before a meal. TAKE 1 TABLET BY MOUTH TWICE DAILY WITH MEALS (APPOINTMENT  REQUIRED  FOR  FUTURE  REFILLS))  . insulin glargine, 2 Unit Dial, (TOUJEO MAX SOLOSTAR) 300 UNIT/ML Solostar Pen Inject 24  units daily. Titrate as instructed. Max daily dose 40 units  . Insulin Pen Needle (PEN NEEDLES) 32G X 4 MM MISC 1 Units by Does not apply route at bedtime.  . lamoTRIgine (LAMICTAL) 200 MG tablet TAKE 2 TABLETS BY MOUTH AT BEDTIME (Patient taking differently: Take 400 mg by mouth at bedtime.)  . loperamide (IMODIUM) 2 MG capsule Take 1 capsule (2 mg total) by mouth as needed for diarrhea or loose stools.  . mesalamine (LIALDA) 1.2 g EC tablet Take 4 tablets (4.8 g total) by mouth daily with breakfast.  . metFORMIN (GLUCOPHAGE) 1000 MG tablet TAKE 1 TABLET BY MOUTH TWICE DAILY WITH MEALS APPOINTMENT  REQUIRED  FOR  FUTURE REFILLS  . nystatin (MYCOSTATIN/NYSTOP) powder Apply 1 application topically 3 (three) times daily. Apply to lower abdominal fold tid x 2 to 3 weeks  . pantoprazole (PROTONIX) 40 MG tablet Take 1 tablet (40 mg total) by mouth daily.  . pramipexole (MIRAPEX) 1 MG tablet Take 1 tablet (1 mg total) by mouth at bedtime.  . risperiDONE (RISPERDAL) 3 MG tablet TAKE 1 TABLET BY MOUTH EVERY DAY AT BEDTIME (Patient taking differently: Take 3 mg by mouth at bedtime.)  . sertraline (ZOLOFT) 100 MG tablet TAKE 1 & 1/2 (ONE & ONE-HALF) TABLETS BY MOUTH ONCE DAILY   No facility-administered encounter medications on file as of 08/29/2020.    Follow Up Plan: [name] will reach out to patient to reschedule appointment.   Eula Fried, BSW, MSW, [facility] Care Management [email or web address] Phone: [phone]

## 2020-09-01 ENCOUNTER — Ambulatory Visit (INDEPENDENT_AMBULATORY_CARE_PROVIDER_SITE_OTHER): Payer: Self-pay | Admitting: Physician Assistant

## 2020-09-01 ENCOUNTER — Encounter: Payer: Self-pay | Admitting: Physician Assistant

## 2020-09-01 ENCOUNTER — Other Ambulatory Visit: Payer: Self-pay

## 2020-09-01 VITALS — BP 142/86 | HR 90 | Temp 98.2°F | Ht 61.0 in | Wt 138.0 lb

## 2020-09-01 DIAGNOSIS — N390 Urinary tract infection, site not specified: Secondary | ICD-10-CM

## 2020-09-01 LAB — URINALYSIS, COMPLETE
Bilirubin, UA: NEGATIVE
Glucose, UA: NEGATIVE
Ketones, UA: NEGATIVE
Nitrite, UA: POSITIVE — AB
Protein,UA: NEGATIVE
RBC, UA: NEGATIVE
Specific Gravity, UA: 1.01 (ref 1.005–1.030)
Urobilinogen, Ur: 0.2 mg/dL (ref 0.2–1.0)
pH, UA: 5.5 (ref 5.0–7.5)

## 2020-09-01 LAB — MICROSCOPIC EXAMINATION

## 2020-09-01 MED ORDER — DOXYCYCLINE HYCLATE 100 MG PO CAPS: 100.0000 mg | ORAL_CAPSULE | Freq: Two times a day (BID) | ORAL | 0 refills | Status: AC

## 2020-09-01 NOTE — Progress Notes (Unsigned)
09/01/2020 2:35 PM   Amy Hansen March 09, 1956 [id number]  CC: Chief Complaint  Patient presents with  . Recurrent UTI   HPI: Amy Hansen is a 65 y.o. female with PMH recurrent UTI previously on suppressive Macrobid, IC, and hematuria who presents today for evaluation of possible UTI.  Today she reports a 3-week history of dysuria, nausea, low back pain, urgency, frequency, and headache.  She initially had blood on the toilet paper when wiping after urination that has since resolved.  She denies fever, chills, and vomiting.  She states she ran out of suppressive Macrobid 1 month ago.  She continues to take topical vaginal estrogen cream twice weekly.  Notably, her most recent urine culture from October 2021 revealed Macrobid intermediate Klebsiella.  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  She has an EpiPen and is comfortable using it.  In-office UA today positive for nitrites and trace leukocyte esterase; urine microscopy with 11-30 WBCs/HPF and many bacteria.   PMH: Past Medical History:  Diagnosis Date  . Anemia   . Anxiety   . Chronic kidney disease    UTI, hematuria in urine  . Colitis   . Crohn's disease ([place])   . Depression   . Diabetes ([place])   . Diverticulosis   . Frequent headaches   . Interstitial cystitis   . Recurrent UTI   . Restless leg syndrome   . Urinary frequency     Surgical History: Past Surgical History:  Procedure Laterality Date  . ABDOMINAL HYSTERECTOMY    . bariatric bypass  2012  . BIOPSY  05/03/2020   Procedure: BIOPSY;  Surgeon: Milus Banister, MD;  Location: [facility] ENDOSCOPY;  Service: Endoscopy;;  . CARPAL TUNNEL RELEASE Right 2003  . CARPAL TUNNEL RELEASE Right    2008  . CHOLECYSTECTOMY  1975  . COLONOSCOPY WITH PROPOFOL N/A 05/03/2020   Procedure: COLONOSCOPY WITH PROPOFOL;  Surgeon: Milus Banister, MD;  Location: [facility] ENDOSCOPY;  Service: Endoscopy;  Laterality: N/A;  .  CYSTOSCOPY W/ RETROGRADES Bilateral 06/06/2015   Procedure: CYSTOSCOPY WITH RETROGRADE PYELOGRAM;  Surgeon: Festus Aloe, MD;  Location: [facility] ORS;  Service: Urology;  Laterality: Bilateral;  . FL INJ LEFT KNEE CT ARTHROGRAM ([facility] HX) Left    1995  . GASTRIC BYPASS  2010  . [place]  2013  . KNEE ARTHROSCOPY Left 1996  . TONSILLECTOMY      Home Medications:  Allergies as of 09/01/2020      Reactions   Avelox [moxifloxacin Hcl In Nacl] Anaphylaxis   Bactrim [sulfamethoxazole-trimethoprim] Anaphylaxis   Ciprofloxacin Other (See Comments)   Pt states she was told never to take this as it is in the same family as Avelox.    Depakote [divalproex Sodium]    Imitrex [sumatriptan] Other (See Comments)   Neck and shoulder pain   Stadol [butorphanol] Rash      Medication List       Accurate as of September 01, 2020  2:35 PM. If you have any questions, ask your nurse or doctor.        ALPRAZolam 0.5 MG tablet Commonly known as: XANAX TAKE 1 TABLET BY MOUTH 4 TIMES DAILY AS NEEDED FOR ANXIETY   buPROPion 150 MG 24 hr tablet Commonly known as: WELLBUTRIN XL Take 3 tablets by mouth once daily   cephALEXin 250 MG capsule Commonly known as: KEFLEX Take 250 mg by mouth 2 (two) times daily.   estradiol 0.1  MG/GM vaginal cream Commonly known as: ESTRACE Apply one pea-sized amount around the opening of the urethra three times weekly.   ferrous sulfate 325 (65 FE) MG EC tablet Take 1 tablet (325 mg total) by mouth 2 (two) times daily.   furosemide 20 MG tablet Commonly known as: LASIX Take 1 tablet (20 mg total) by mouth daily.   glipiZIDE 5 MG tablet Commonly known as: GLUCOTROL TAKE 1 TABLET BY MOUTH TWICE DAILY WITH MEALS (APPOINTMENT  REQUIRED  FOR  FUTURE  REFILLS) What changed:   how much to take  how to take this  when to take this   lamoTRIgine 200 MG tablet Commonly known as: LAMICTAL TAKE 2 TABLETS BY MOUTH AT BEDTIME   loperamide 2 MG  capsule Commonly known as: IMODIUM Take 1 capsule (2 mg total) by mouth as needed for diarrhea or loose stools.   mesalamine 1.2 g EC tablet Commonly known as: LIALDA Take 4 tablets (4.8 g total) by mouth daily with breakfast.   metFORMIN 1000 MG tablet Commonly known as: GLUCOPHAGE TAKE 1 TABLET BY MOUTH TWICE DAILY WITH MEALS APPOINTMENT  REQUIRED  FOR  FUTURE REFILLS   nystatin powder Commonly known as: MYCOSTATIN/NYSTOP Apply 1 application topically 3 (three) times daily. Apply to lower abdominal fold tid x 2 to 3 weeks   pantoprazole 40 MG tablet Commonly known as: PROTONIX Take 1 tablet (40 mg total) by mouth daily.   Pen Needles 32G X 4 MM Misc 1 Units by Does not apply route at bedtime.   pramipexole 1 MG tablet Commonly known as: MIRAPEX Take 1 tablet (1 mg total) by mouth at bedtime.   risperiDONE 3 MG tablet Commonly known as: RISPERDAL TAKE 1 TABLET BY MOUTH EVERY DAY AT BEDTIME   sertraline 100 MG tablet Commonly known as: ZOLOFT TAKE 1 & 1/2 (ONE & ONE-HALF) TABLETS BY MOUTH ONCE DAILY   Toujeo Max SoloStar 300 UNIT/ML Solostar Pen Generic drug: insulin glargine (2 Unit Dial) Inject 24 units daily. Titrate as instructed. Max daily dose 40 units   Vitamin D3 1.25 MG (50000 UT) Caps Take 1 capsule by mouth once a week.       Allergies:  Allergies  Allergen Reactions  . Avelox [Moxifloxacin Hcl In Nacl] Anaphylaxis  . Bactrim [Sulfamethoxazole-Trimethoprim] Anaphylaxis  . Ciprofloxacin Other (See Comments)    Pt states she was told never to take this as it is in the same family as Avelox.   . Depakote [Divalproex Sodium]   . Imitrex [Sumatriptan] Other (See Comments)    Neck and shoulder pain  . Stadol [Butorphanol] Rash    Family History: Family History  Problem Relation Age of Onset  . Stroke Father   . Colon cancer Mother   . Heart failure Sister   . Bladder Cancer Neg Hx   . Kidney disease Neg Hx   . Prostate cancer Neg Hx   . Kidney  cancer Neg Hx   . Pancreatic cancer Neg Hx   . Esophageal cancer Neg Hx     Social History:   reports that she quit smoking about 45 years ago. Her smoking use included cigarettes. She smoked 0.50 packs per day. She has never used smokeless tobacco. She reports that she does not drink alcohol and does not use drugs.  Physical Exam: BP (!) 142/86   Pulse 90   Temp 98.2 F (36.8 C) (Oral)   Ht 5' 1"  (1.549 m)   Wt 138 lb (62.6 kg)  LMP  (LMP Unknown)   BMI 26.07 kg/m   Constitutional:  Alert and oriented, no acute distress, nontoxic appearing HEENT: [place], AT Cardiovascular: No clubbing, cyanosis, or edema Respiratory: Normal respiratory effort, no increased work of breathing Skin: No rashes, bruises or suspicious lesions Neurologic: Grossly intact, no focal deficits, moving all 4 extremities Psychiatric: Normal mood and affect  Laboratory Data: Results for orders placed or performed in visit on 09/01/20  Microscopic Examination   Urine  Result Value Ref Range   WBC, UA 11-30 (A) 0 - 5 /hpf   RBC 0-2 0 - 2 /hpf   Epithelial Cells (non renal) 0-10 0 - 10 /hpf   Bacteria, UA Many (A) None seen/Few  Urinalysis, Complete  Result Value Ref Range   Specific Gravity, UA 1.010 1.005 - 1.030   pH, UA 5.5 5.0 - 7.5   Color, UA Yellow Yellow   Appearance Ur Cloudy (A) Clear   Leukocytes,UA Trace (A) Negative   Protein,UA Negative Negative/Trace   Glucose, UA Negative Negative   Ketones, UA Negative Negative   RBC, UA Negative Negative   Bilirubin, UA Negative Negative   Urobilinogen, Ur 0.2 0.2 - 1.0 mg/dL   Nitrite, UA Positive (A) Negative   Microscopic Examination See below:    Assessment & Plan:   1. Recurrent UTI Grossly infected UA today with multiple irritative symptoms consistent with past infections in the setting of recent discontinuation of suppressive Macrobid.  We will start empiric doxycycline and send for culture for further evaluation.  Recommend reinitiation  of suppressive antibiotics.  If culture finalizes with a Macrobid sensitive bacteria, okay to resume Macrobid.  If Macrobid resistant, I recommend consideration of alternative agents for suppression.  This will be challenging given her multiple medication allergies and recent resistance patterns on urine culture.  A potential option would be a trial of trimethoprim, as it is possible that her anaphylactic reaction is to the sulfa component of Bactrim.  I offered the patient a referral to immunology today for trimethoprim allergy testing, however she declined this as she is uninsured and concerned about cost.  She would like to try trimethoprim.  I explained that I would only agree to this if she took the medication while having an unexpired EpiPen on hand, being comfortable using the EpiPen, and with the understanding that she proceed to the ED immediately upon using her EpiPen regardless of symptomatic improvement. Patient is in agreement with this plan and demonstrated knowledge of the s/s of anaphylaxis. - Urinalysis, Complete - CULTURE, URINE COMPREHENSIVE - doxycycline (VIBRAMYCIN) 100 MG capsule; Take 1 capsule (100 mg total) by mouth 2 (two) times daily for 7 days.  Dispense: 14 capsule; Refill: 0   Return if symptoms worsen or fail to improve.  Debroah Loop, PA-C  [facility] [address], [place], [place] [address] [phone]

## 2020-09-01 NOTE — Patient Instructions (Signed)
Please make sure that your EpiPen is current and not expired, then let me know if you require a refill. Today we discussed holding off on allergy testing due to cost concerns. Instead we will try to switch your suppressive antibiotics to trimethoprim, but we will only attempt this if the following are true: 1. You have your EpiPen with you and is it not expired. 2. You know how to use your EpiPen and feel comfortable doing so. 3. You know the signs and symptoms of anaphylaxis. 4. You know to go to the Emergency Department IMMEDIATELY after using your EpiPen, even if you feel better.

## 2020-09-02 ENCOUNTER — Encounter: Payer: Self-pay | Admitting: Gastroenterology

## 2020-09-02 ENCOUNTER — Encounter: Payer: Self-pay | Admitting: Physician Assistant

## 2020-09-02 ENCOUNTER — Ambulatory Visit (INDEPENDENT_AMBULATORY_CARE_PROVIDER_SITE_OTHER): Payer: Self-pay | Admitting: Gastroenterology

## 2020-09-02 ENCOUNTER — Ambulatory Visit (INDEPENDENT_AMBULATORY_CARE_PROVIDER_SITE_OTHER): Payer: Self-pay | Admitting: Physician Assistant

## 2020-09-02 VITALS — BP 128/88 | HR 96 | Ht 60.0 in | Wt 135.4 lb

## 2020-09-02 DIAGNOSIS — G4700 Insomnia, unspecified: Secondary | ICD-10-CM

## 2020-09-02 DIAGNOSIS — F411 Generalized anxiety disorder: Secondary | ICD-10-CM

## 2020-09-02 DIAGNOSIS — G2581 Restless legs syndrome: Secondary | ICD-10-CM

## 2020-09-02 DIAGNOSIS — E559 Vitamin D deficiency, unspecified: Secondary | ICD-10-CM

## 2020-09-02 DIAGNOSIS — K529 Noninfective gastroenteritis and colitis, unspecified: Secondary | ICD-10-CM

## 2020-09-02 DIAGNOSIS — F319 Bipolar disorder, unspecified: Secondary | ICD-10-CM

## 2020-09-02 DIAGNOSIS — D509 Iron deficiency anemia, unspecified: Secondary | ICD-10-CM

## 2020-09-02 DIAGNOSIS — G43009 Migraine without aura, not intractable, without status migrainosus: Secondary | ICD-10-CM

## 2020-09-02 MED ORDER — SUTAB 1479-225-188 MG PO TABS: 1.0000 | ORAL_TABLET | Freq: Once | ORAL | 0 refills | Status: AC

## 2020-09-02 MED ORDER — LAMOTRIGINE 200 MG PO TABS: 400.0000 mg | ORAL_TABLET | Freq: Every day | ORAL | 1 refills | Status: DC

## 2020-09-02 MED ORDER — VITAMIN D 50 MCG (2000 UT) PO CAPS: 1.0000 | ORAL_CAPSULE | Freq: Every day | ORAL | Status: DC

## 2020-09-02 MED ORDER — SERTRALINE HCL 100 MG PO TABS: ORAL_TABLET | ORAL | 1 refills | Status: DC

## 2020-09-02 NOTE — Progress Notes (Signed)
HPI :  65 year old female here for a follow-up visit for colitis and iron deficiency anemia.  She has a history of gastric bypass surgery in 2010.  I know her from when she was admitted to the hospital in October 21 for iron deficiency anemia and diarrhea/colitis.  She had an infectious work-up performed which was negative.  CT scan showed pancolitis at the time.  She underwent a colonoscopy with Dr. Ardis Hughs showing focal active nonspecific ileitis, patchy nonspecific colitis in the right and left colon on biopsies, pancolonic inflammation grossly.  She was started on prednisone and then transition eventually to Lialda maintenance therapy.  She has received a few doses of IV iron.  She has done really well with IV iron, steroids and transitioning to Lialda.  She currently has 1-2 bowel movements a day with formed stools.  No diarrhea.  No blood in the stools.  Her hemoglobin has come back to normal, as have iron studies, from January 25. She denies any abdominal pains.  During her course in the hospitalization she had some elevated liver enzymes which was worked up with serologies.  AMA was mildly elevated and ANA was positive however smooth muscle AB negative as well as IgG.  With treatment of her colitis her liver enzymes have normalized.  She states around the time she was diagnosed with colitis she was taking Excedrin twice daily for migraines as well as daily use of Advil and aspirin.  She has since stopped all NSAIDs since her diagnosis.  She is also been taking vitamin D supplementation when she was noted to have deficiency there.  We discussed options moving forward and any further work-up needs to occur.   Colonoscopy 05/03/2020: - Mild to moderate inflammation from anus to cecum with a normal appearing terminal ileum. Multiple biopsies taken.  A. SMALL BOWEL, TERMINAL ILEUM, BIOPSY:  - Focally active nonspecific ileitis  - Negative for features of chronicity or granulomas   B. COLON,  RIGHT, BIOPSY:  - Patchy active nonspecific colitis with focal ulceration  - Focal microscopic aggregate of epithelioid histiocytes suggestive of a  microadenoma  - See comment   C. COLON, LEFT, BIOPSY:  - Patchy mildly active nonspecific colitis  - Negative for features of chronicity or granulomas  COMMENT:   A, B and C.  Taken together, the ileocolonic biopsies show patchy  active colitis with microgranulomas within the right colon. Diagnostic  histologic features of idiopathic inflammatory bowel disease, including  lamina propria and basal lymphoplasmacytosis or crypt architectural  distortion are not present. Differential diagnosis can include  infection, drug effect and evolving/early inflammatory bowel disease  (e.g. Crohn's disease). AFB and GMS special stains are negative for  acid-fast bacilli and fungal organisms respectively.  Clinical-radiologic correlation and patient follow-up is suggested.    CTAP with contrast 04/21/2020: 1. Diffuse circumferential wall thickening of virtually all of the colon, most notably at the level of the cecum and ascending colon, consistent with infectious or inflammatory colitis. 2. There is a punctate focus of gas within the urinary bladder. Correlate for history of recent instrumentation. Aortic Atherosclerosis    Past Medical History:  Diagnosis Date  . Anemia   . Anxiety   . Chronic kidney disease    UTI, hematuria in urine  . Colitis   . Crohn's disease ([place])   . Depression   . Diabetes ([place])   . Diverticulosis   . Frequent headaches   . Interstitial cystitis   . Recurrent UTI   .  Restless leg syndrome   . Urinary frequency      Past Surgical History:  Procedure Laterality Date  . ABDOMINAL HYSTERECTOMY    . bariatric bypass  2012  . BIOPSY  05/03/2020   Procedure: BIOPSY;  Surgeon: Milus Banister, MD;  Location: [facility] ENDOSCOPY;  Service: Endoscopy;;  . CARPAL TUNNEL RELEASE Right 2003  . CARPAL TUNNEL  RELEASE Right    2008  . CHOLECYSTECTOMY  1975  . COLONOSCOPY WITH PROPOFOL N/A 05/03/2020   Procedure: COLONOSCOPY WITH PROPOFOL;  Surgeon: Milus Banister, MD;  Location: [facility] ENDOSCOPY;  Service: Endoscopy;  Laterality: N/A;  . CYSTOSCOPY W/ RETROGRADES Bilateral 06/06/2015   Procedure: CYSTOSCOPY WITH RETROGRADE PYELOGRAM;  Surgeon: Festus Aloe, MD;  Location: [facility] ORS;  Service: Urology;  Laterality: Bilateral;  . FL INJ LEFT KNEE CT ARTHROGRAM ([facility] HX) Left    1995  . GASTRIC BYPASS  2010  . [place]  2013  . KNEE ARTHROSCOPY Left 1996  . TONSILLECTOMY     Family History  Problem Relation Age of Onset  . Stroke Father   . Colon cancer Mother   . Heart failure Sister   . Bladder Cancer Neg Hx   . Kidney disease Neg Hx   . Prostate cancer Neg Hx   . Kidney cancer Neg Hx   . Pancreatic cancer Neg Hx   . Esophageal cancer Neg Hx    Social History   Tobacco Use  . Smoking status: Former Smoker    Packs/day: 0.50    Types: Cigarettes    Quit date: 04/25/1975    Years since quitting: 45.3  . Smokeless tobacco: Never Used  . Tobacco comment: quit 40 years  Vaping Use  . Vaping Use: Never used  Substance Use Topics  . Alcohol use: No    Alcohol/week: 0.0 standard drinks  . Drug use: No   Current Outpatient Medications  Medication Sig Dispense Refill  . ALPRAZolam (XANAX) 0.5 MG tablet TAKE 1 TABLET BY MOUTH 4 TIMES DAILY AS NEEDED FOR ANXIETY 120 tablet 0  . buPROPion (WELLBUTRIN XL) 150 MG 24 hr tablet Take 3 tablets by mouth once daily 270 tablet 0  . Cholecalciferol (VITAMIN D3) 1.25 MG (50000 UT) CAPS Take 1 capsule by mouth once a week. 12 capsule 0  . doxycycline (VIBRAMYCIN) 100 MG capsule Take 1 capsule (100 mg total) by mouth 2 (two) times daily for 7 days. 14 capsule 0  . estradiol (ESTRACE) 0.1 MG/GM vaginal cream Apply one pea-sized amount around the opening of the urethra three times weekly. 42.5 g 12  . ferrous sulfate 325 (65 FE) MG EC tablet  Take 1 tablet (325 mg total) by mouth 2 (two) times daily. 60 tablet 3  . furosemide (LASIX) 20 MG tablet Take 1 tablet (20 mg total) by mouth daily. 30 tablet 11  . glipiZIDE (GLUCOTROL) 5 MG tablet TAKE 1 TABLET BY MOUTH TWICE DAILY WITH MEALS (APPOINTMENT  REQUIRED  FOR  FUTURE  REFILLS) (Patient taking differently: Take 5 mg by mouth 2 (two) times daily before a meal. TAKE 1 TABLET BY MOUTH TWICE DAILY WITH MEALS (APPOINTMENT  REQUIRED  FOR  FUTURE  REFILLS)) 180 tablet 0  . insulin glargine, 2 Unit Dial, (TOUJEO MAX SOLOSTAR) 300 UNIT/ML Solostar Pen Inject 24 units daily. Titrate as instructed. Max daily dose 40 units 12 mL 1  . Insulin Pen Needle (PEN NEEDLES) 32G X 4 MM MISC 1 Units by Does not apply route at bedtime.  100 each 1  . lamoTRIgine (LAMICTAL) 200 MG tablet Take 2 tablets (400 mg total) by mouth at bedtime. 180 tablet 1  . mesalamine (LIALDA) 1.2 g EC tablet Take 4 tablets (4.8 g total) by mouth daily with breakfast. 360 tablet 1  . metFORMIN (GLUCOPHAGE) 1000 MG tablet TAKE 1 TABLET BY MOUTH TWICE DAILY WITH MEALS APPOINTMENT  REQUIRED  FOR  FUTURE REFILLS 60 tablet 0  . nystatin (MYCOSTATIN/NYSTOP) powder Apply 1 application topically 3 (three) times daily. Apply to lower abdominal fold tid x 2 to 3 weeks 15 g 0  . pantoprazole (PROTONIX) 40 MG tablet Take 1 tablet (40 mg total) by mouth daily. 30 tablet 1  . pramipexole (MIRAPEX) 1 MG tablet Take 1 tablet (1 mg total) by mouth at bedtime. 90 tablet 1  . risperiDONE (RISPERDAL) 3 MG tablet TAKE 1 TABLET BY MOUTH EVERY DAY AT BEDTIME (Patient taking differently: Take 3 mg by mouth at bedtime.) 30 tablet 5  . sertraline (ZOLOFT) 100 MG tablet TAKE 1 & 1/2 (ONE & ONE-HALF) TABLETS BY MOUTH ONCE DAILY 135 tablet 1  . loperamide (IMODIUM) 2 MG capsule Take 1 capsule (2 mg total) by mouth as needed for diarrhea or loose stools. (Patient not taking: Reported on 09/02/2020) 30 capsule 0   No current facility-administered medications for  this visit.   Allergies  Allergen Reactions  . Avelox [Moxifloxacin Hcl In Nacl] Anaphylaxis  . Bactrim [Sulfamethoxazole-Trimethoprim] Anaphylaxis  . Ciprofloxacin Other (See Comments)    Pt states she was told never to take this as it is in the same family as Avelox.   . Depakote [Divalproex Sodium]   . Imitrex [Sumatriptan] Other (See Comments)    Neck and shoulder pain  . Stadol [Butorphanol] Rash     Review of Systems: All systems reviewed and negative except where noted in HPI.   Lab Results  Component Value Date   WBC 4.8 08/23/2020   HGB 12.2 08/23/2020   HCT 37.2 08/23/2020   MCV 88.8 08/23/2020   PLT 357.0 08/23/2020    Lab Results  Component Value Date   CREATININE 0.61 05/26/2020   BUN 5 (L) 05/26/2020   NA 138 05/26/2020   K 4.1 05/26/2020   CL 103 05/26/2020   CO2 21 05/26/2020  '  Lab Results  Component Value Date   ALT 21 08/23/2020   AST 15 08/23/2020   ALKPHOS 99 08/23/2020   BILITOT 0.2 08/23/2020   Lab Results  Component Value Date   IRON 74 08/23/2020   TIBC 343 08/23/2020   FERRITIN 27 08/23/2020     Physical Exam: BP 128/88 (BP Location: Left Arm, Patient Position: Sitting, Cuff Size: Normal)   Pulse 96   Ht 5' (1.524 m) Comment: height measured without shoes  Wt 135 lb 6 oz (61.4 kg)   LMP  (LMP Unknown)   BMI 26.44 kg/m  Constitutional: Pleasant,well-developed, female in no acute distress. Neurological: Alert and oriented to person place and time. Psychiatric: Normal mood and affect. Behavior is normal.   ASSESSMENT AND PLAN: 65 year old female here for reassessment of the following:  Colitis Iron deficiency anemia Vitamin D deficiency  History as outlined above.  Severe iron deficiency anemia with diarrhea and colitis back in October.  This was occurring in the setting of significant NSAID use.  Her presentation would be rather dramatic for NSAID related colitis although it is possible, as her colonoscopy biopsies were  not classic for Crohn's disease /IBD.  She has  done remarkably well since stopping NSAIDs, course of prednisone, and transitioning to Lialda monotherapy.  She has normal bowel habits, no abdominal pain and her anemia has resolved.  She has now been off NSAIDs for several months.  I am recommending a colonoscopy to reevaluate her colon on Lialda to ensure mucosal healing, take biopsies to see if any evidence of ongoing colitis.  If she has no evidence of colitis or chronicity on biopsies then we could consider stopping the Lialda and monitoring her course with resumption of it if she notices any symptoms.  If she has clear evidence of IBD otherwise would continue her therapy definitely.  I discussed risks and benefits of colonoscopy and anesthesia with her and she wants to proceed.  I think it is okay to reduce her Lialda to 2 tabs per day at this time.  She can continue to take vitamin D supplementation 2000 units a day and will need to monitor CBC and LFTs moving forward.  If she has any flares of symptoms in the interim she should contact us.  Plan: - colonoscopy - reduce Lialda to 2 tabs / day - continue vitamin D at 2000 IU / day - monitor Hgb and LFTs moving forward - NO NSAIDs  [place] Cellar, MD [facility]

## 2020-09-02 NOTE — Patient Instructions (Addendum)
If you are age 64 or older, your body mass index should be between 23-30. Your Body mass index is 26.44 kg/m. If this is out of the aforementioned range listed, please consider follow up with your Primary Care Provider.  If you are age 77 or younger, your body mass index should be between 19-25. Your Body mass index is 26.44 kg/m. If this is out of the aformentioned range listed, please consider follow up with your Primary Care Provider.   Due to recent changes in healthcare laws, you may see the results of your imaging and laboratory studies on MyChart before your provider has had a chance to review them.  We understand that in some cases there may be results that are confusing or concerning to you. Not all laboratory results come back in the same time frame and the provider may be waiting for multiple results in order to interpret others.  Please give [name] 48 hours in order for your provider to thoroughly review all the results before contacting the office for clarification of your results.   You have been scheduled for a colonoscopy. Please follow written instructions given to you at your visit today.  Please pick up your prep supplies at the pharmacy within the next 1-3 days. If you use inhalers (even only as needed), please bring them with you on the day of your procedure.  We are giving you a sample of Sutab today.   Decrease Lialda to 2.4 g (2 tablets) daily with breakfast.  Please purchase the following medications over the counter and take as directed: Vitamin D 2000 units daily.     Thank you for entrusting me with your care and for choosing [facility], Dr. [place] Cellar

## 2020-09-02 NOTE — Progress Notes (Signed)
[facility]  Patient ID: Amy Hansen,  MRN: [id number]  PCP: [name], NP  Date of Evaluation: 09/02/2020 Time spent:20 minutes  Chief Complaint:  Chief Complaint    Anxiety; Depression; Insomnia      HISTORY/CURRENT STATUS: For 6 month f/u.  Had a GI bleed in Oct and had to have 2 blood transfusions. Found to have Crohns colitis. Is doing much better now.   Doing well emotionally. Able to enjoy things, energy and motivation are better since her H/H are better.  She is not isolating.  She works at [employer] and that is going fine.  It is hard work at her age.  Appetite is normal.  Denies suicidal or homicidal thoughts.  Sleeps good.  Anxiety is well controlled with the Xanax.  She is not having panic attacks but more of a generalized sense of uneasiness that is relieved with Xanax.  Patient denies increased energy with decreased need for sleep, no increased talkativeness, no racing thoughts, no impulsivity or risky behaviors, no increased spending, no increased libido, no grandiosity. No AH/VH.  Denies dizziness, syncope, seizures, numbness, tingling, tremor, tics, unsteady gait, slurred speech, confusion. Denies muscle or joint pain, stiffness, or dystonia.  Individual Medical History/ Review of Systems: Changes? :Yes See above  Past medications for mental health diagnoses include: Trazodone, Risperdal, Zoloft, Lunesta, praises son, Read D[employer], Prozac, Depakote, Lamictal, lithium, Wellbutrin, Xanax, Ambien, carbamazepine  Allergies: Avelox [moxifloxacin hcl in nacl], Bactrim [sulfamethoxazole-trimethoprim], Ciprofloxacin, Depakote [divalproex sodium], Imitrex [sumatriptan], and Stadol [butorphanol]  Current Medications:  Current Outpatient Medications:  .  ALPRAZolam (XANAX) 0.5 MG tablet, TAKE 1 TABLET BY MOUTH 4 TIMES DAILY AS NEEDED FOR ANXIETY, Disp: 120 tablet, Rfl: 0 .  buPROPion (WELLBUTRIN XL) 150 MG 24 hr tablet, Take 3 tablets by mouth once  daily, Disp: 270 tablet, Rfl: 0 .  Cholecalciferol (VITAMIN D3) 1.25 MG (50000 UT) CAPS, Take 1 capsule by mouth once a week., Disp: 12 capsule, Rfl: 0 .  doxycycline (VIBRAMYCIN) 100 MG capsule, Take 1 capsule (100 mg total) by mouth 2 (two) times daily for 7 days., Disp: 14 capsule, Rfl: 0 .  estradiol (ESTRACE) 0.1 MG/GM vaginal cream, Apply one pea-sized amount around the opening of the urethra three times weekly., Disp: 42.5 g, Rfl: 12 .  ferrous sulfate 325 (65 FE) MG EC tablet, Take 1 tablet (325 mg total) by mouth 2 (two) times daily., Disp: 60 tablet, Rfl: 3 .  furosemide (LASIX) 20 MG tablet, Take 1 tablet (20 mg total) by mouth daily., Disp: 30 tablet, Rfl: 11 .  glipiZIDE (GLUCOTROL) 5 MG tablet, TAKE 1 TABLET BY MOUTH TWICE DAILY WITH MEALS (APPOINTMENT  REQUIRED  FOR  FUTURE  REFILLS) (Patient taking differently: Take 5 mg by mouth 2 (two) times daily before a meal. TAKE 1 TABLET BY MOUTH TWICE DAILY WITH MEALS (APPOINTMENT  REQUIRED  FOR  FUTURE  REFILLS)), Disp: 180 tablet, Rfl: 0 .  insulin glargine, 2 Unit Dial, (TOUJEO MAX SOLOSTAR) 300 UNIT/ML Solostar Pen, Inject 24 units daily. Titrate as instructed. Max daily dose 40 units, Disp: 12 mL, Rfl: 1 .  Insulin Pen Needle (PEN NEEDLES) 32G X 4 MM MISC, 1 Units by Does not apply route at bedtime., Disp: 100 each, Rfl: 1 .  loperamide (IMODIUM) 2 MG capsule, Take 1 capsule (2 mg total) by mouth as needed for diarrhea or loose stools., Disp: 30 capsule, Rfl: 0 .  mesalamine (LIALDA) 1.2 g EC tablet, Take 4 tablets (4.8 g  total) by mouth daily with breakfast., Disp: 360 tablet, Rfl: 1 .  metFORMIN (GLUCOPHAGE) 1000 MG tablet, TAKE 1 TABLET BY MOUTH TWICE DAILY WITH MEALS APPOINTMENT  REQUIRED  FOR  FUTURE REFILLS, Disp: 60 tablet, Rfl: 0 .  nystatin (MYCOSTATIN/NYSTOP) powder, Apply 1 application topically 3 (three) times daily. Apply to lower abdominal fold tid x 2 to 3 weeks, Disp: 15 g, Rfl: 0 .  pantoprazole (PROTONIX) 40 MG tablet, Take  1 tablet (40 mg total) by mouth daily., Disp: 30 tablet, Rfl: 1 .  pramipexole (MIRAPEX) 1 MG tablet, Take 1 tablet (1 mg total) by mouth at bedtime., Disp: 90 tablet, Rfl: 1 .  risperiDONE (RISPERDAL) 3 MG tablet, TAKE 1 TABLET BY MOUTH EVERY DAY AT BEDTIME (Patient taking differently: Take 3 mg by mouth at bedtime.), Disp: 30 tablet, Rfl: 5 .  cephALEXin (KEFLEX) 250 MG capsule, Take 250 mg by mouth 2 (two) times daily. (Patient not taking: Reported on 09/01/2020), Disp: , Rfl:  .  lamoTRIgine (LAMICTAL) 200 MG tablet, Take 2 tablets (400 mg total) by mouth at bedtime., Disp: 180 tablet, Rfl: 1 .  sertraline (ZOLOFT) 100 MG tablet, TAKE 1 & 1/2 (ONE & ONE-HALF) TABLETS BY MOUTH ONCE DAILY, Disp: 135 tablet, Rfl: 1 Medication Side Effects: none  Family Medical/ Social History: Changes?  No  MENTAL HEALTH EXAM:  There were no vitals taken for this visit.There is no height or weight on file to calculate BMI.  General Appearance: Casual, Neat, Well Groomed and Obese  Eye Contact:  Good  Speech:  Clear and Coherent and Normal Rate  Volume:  Normal  Mood:  Euthymic  Affect:  Appropriate  Thought Process:  Goal Directed and Descriptions of Associations: Intact  Orientation:  Full (Time, Place, and Person)  Thought Content: Logical   Suicidal Thoughts:  No  Homicidal Thoughts:  No  Memory:  WNL  Judgement:  Good  Insight:  Good  Psychomotor Activity:  Normal  Concentration:  Concentration: Good and Attention Span: Good  Recall:  Good  Fund of Knowledge: Good  Language: Good  Assets:  Desire for Improvement  ADL's:  Intact  Cognition: WNL  Prognosis:  Good   Reviewed 04/30/2020 ER notes and labs. Most recent pertinent labs for psych medications include: 05/26/2020 BMP glucose 261 BUN 5, creatinine 0.61 04/26/2020 lipid panel cholesterol 58, triglycerides 63, HDL 29, LDL 16  DIAGNOSES:    ICD-10-CM   1. Bipolar I disorder ([place])  F31.9   2. Generalized anxiety disorder  F41.1   3.  Insomnia, unspecified type  G47.00   4. Restless legs syndrome (RLS)  G25.81   5. Vitamin D deficiency  E55.9   6. Migraine without aura and without status migrainosus, not intractable  G43.009     Receiving Psychotherapy: No    RECOMMENDATIONS:  PDMP reviewed.  I provided 30 minutes of face-to-face care during this encounter reviewing hospital records including most recent labs for psych purposes.  She does follow up with her endocrinologist concerning the diabetes.  As far as her mental health goes she is doing well so no changes would be made. Continue Zoloft 100 mg, 1.5 pills daily. Continue Xanax 0.5 mg 4 times daily as needed.  Continue Wellbutrin XL 150 mg, 3 p.o. daily. Continue Lamictal 200 mg, 1 p.o. twice daily. Continue Mirapex 1 mg nightly (from another provider). Continue Risperdal 3 mg nightly.   Recommend counseling. Return in 6 months.  Donnal Moat, PA-C

## 2020-09-05 ENCOUNTER — Telehealth: Payer: Self-pay | Admitting: Gastroenterology

## 2020-09-05 LAB — CULTURE, URINE COMPREHENSIVE

## 2020-09-06 ENCOUNTER — Telehealth: Payer: Self-pay

## 2020-09-06 NOTE — Telephone Encounter (Signed)
Resolved patient's questions, please diregard message

## 2020-09-06 NOTE — Chronic Care Management (AMB) (Signed)
  Care Management   Note  09/06/2020 Name: TAKODA SIEDLECKI MRN: [id number] DOB: May 23, 1956  RANISHA ALLAIRE is a 65 y.o. year old female who is a primary care patient of [name], NP and is actively engaged with the care management team. I reached out to [name] by phone today to assist with re-scheduling a follow up visit with the Licensed Clinical Social Worker  Follow up plan: Unsuccessful telephone outreach attempt made. A HIPAA compliant phone message was left for the patient providing contact information and requesting a return call.  The care management team will reach out to the patient again over the next 7 days.  If patient returns call to provider office, please advise to call [facility]  at [place], [place], [place],  [address] Direct Dial: [phone] [email or web address]: [email or web address]

## 2020-09-07 ENCOUNTER — Other Ambulatory Visit: Payer: Self-pay | Admitting: Physician Assistant

## 2020-09-16 ENCOUNTER — Telehealth: Payer: Self-pay | Admitting: Family Medicine

## 2020-09-16 NOTE — Telephone Encounter (Signed)
-----   Message from Nori Riis, PA-C sent at 09/16/2020 12:42 PM EST ----- Please let Mrs. Morikawa know that her urine culture was positive for infection and to finish the doxycyline.

## 2020-09-16 NOTE — Telephone Encounter (Signed)
Patient notified and voiced understanding.

## 2020-09-23 ENCOUNTER — Other Ambulatory Visit: Payer: Self-pay | Admitting: Physician Assistant

## 2020-09-27 NOTE — Chronic Care Management (AMB) (Signed)
  Care Management   Note  09/27/2020 Name: PAVIELLE BIGGAR MRN: [id number] DOB: August 12, 1955  ELAINE ROANHORSE is a 65 y.o. year old female who is a primary care patient of [name], NP and is actively engaged with the care management team. I reached out to [name] by phone today to assist with re-scheduling a follow up visit with the Licensed Clinical Social Worker  Follow up plan: Telephone appointment with care management team member scheduled for:10/31/2020  Noreene Larsson, [place], [place], [place] [address] Direct Dial: [phone] [email or web address] Website: [email or web address]

## 2020-09-29 ENCOUNTER — Other Ambulatory Visit: Payer: Self-pay | Admitting: Physician Assistant

## 2020-10-04 ENCOUNTER — Other Ambulatory Visit: Payer: Self-pay | Admitting: Internal Medicine

## 2020-10-06 ENCOUNTER — Other Ambulatory Visit: Payer: Self-pay

## 2020-10-06 ENCOUNTER — Encounter: Payer: Self-pay | Admitting: Gastroenterology

## 2020-10-06 ENCOUNTER — Ambulatory Visit (AMBULATORY_SURGERY_CENTER): Payer: Self-pay | Admitting: Gastroenterology

## 2020-10-06 VITALS — BP 144/66 | HR 78 | Temp 97.7°F | Resp 21 | Ht 60.0 in | Wt 135.0 lb

## 2020-10-06 DIAGNOSIS — K5150 Left sided colitis without complications: Secondary | ICD-10-CM

## 2020-10-06 DIAGNOSIS — K5730 Diverticulosis of large intestine without perforation or abscess without bleeding: Secondary | ICD-10-CM

## 2020-10-06 DIAGNOSIS — K529 Noninfective gastroenteritis and colitis, unspecified: Secondary | ICD-10-CM

## 2020-10-06 MED ORDER — SODIUM CHLORIDE 0.9 % IV SOLN: 500.0000 mL | Freq: Once | INTRAVENOUS | Status: DC

## 2020-10-06 NOTE — Progress Notes (Signed)
Called to room to assist during endoscopic procedure.  Patient ID and intended procedure confirmed with present staff. Received instructions for my participation in the procedure from the performing physician.  

## 2020-10-06 NOTE — Progress Notes (Signed)
VS taken by Enis Gash

## 2020-10-06 NOTE — Op Note (Signed)
[facility] Patient Name: Amy Hansen Procedure Date: 10/06/2020 5:09 PM MRN: [id number] Endoscopist: Remo Lipps P. Yancarlos Berthold , MD Age: 65 Referring MD:  Date of Birth: 29-Jan-1956 Gender: Female Account #: [id number] Procedure:                Colonoscopy Indications:              Chronic diarrhea, Follow-up of colitis - previously                            noted to have mild colitis with mild inflammation                            in the ileum on prior colonoscopy 04/2020 when                            admitted for severe IDA and colitis in the setting                            of NSAID use. Now on Lialda after treatment with                            prednisone with significant improvement in                            symptoms. Surveillance to see if in remission on                            Lialda 2.4gm / day Medicines:                Monitored Anesthesia Care Procedure:                Pre-Anesthesia Assessment:                           - Prior to the procedure, a History and Physical                            was performed, and patient medications and                            allergies were reviewed. The patient's tolerance of                            previous anesthesia was also reviewed. The risks                            and benefits of the procedure and the sedation                            options and risks were discussed with the patient.                            All questions were answered, and informed consent  was obtained. Prior Anticoagulants: The patient has                            taken no previous anticoagulant or antiplatelet                            agents. ASA Grade Assessment: III - A patient with                            severe systemic disease. After reviewing the risks                            and benefits, the patient was deemed in                            satisfactory condition to undergo the  procedure.                           After obtaining informed consent, the colonoscope                            was passed under direct vision. Throughout the                            procedure, the patient's blood pressure, pulse, and                            oxygen saturations were monitored continuously. The                            Olympus PCF-H190DL ([id number]) Colonoscope was                            introduced through the anus and advanced to the the                            terminal ileum, with identification of the                            appendiceal orifice and IC valve. The colonoscopy                            was performed without difficulty. The patient                            tolerated the procedure well. The quality of the                            bowel preparation was fair. The terminal ileum,                            ileocecal valve, appendiceal orifice, and rectum  were photographed. Scope In: 5:17:16 PM Scope Out: 5:39:00 PM Scope Withdrawal Time: 0 hours 14 minutes 51 seconds  Total Procedure Duration: 0 hours 21 minutes 44 seconds  Findings:                 The perianal and digital rectal examinations were                            normal.                           The terminal ileum appeared normal.                           Patchy inflammation characterized by erosions,                            granularity, mucus and aphthous ulcerations was                            found in the entire colon, most prominent in the                            left colon. Biopsies were taken with a cold forceps                            for histology.                           Scattered medium-mouthed diverticula were found in                            the entire colon.                           The exam was otherwise without abnormality. Complications:            No immediate complications. Estimated blood loss:                             Minimal. Estimated Blood Loss:     Estimated blood loss was minimal. Impression:               - Preparation of the colon was fair.                           - The examined portion of the ileum was normal.                           - Patchy inflammation was found in the entire                            examined colon - characterized by patchy erosions /                            ulcerations, mild erythema. Biopsied.                           -  Diverticulosis in the entire examined colon.                           - The examination was otherwise normal.                           Patient has been on NSAIDs. Suspect findings could                            represent Crohn's colitis, will await biopsy                            results. Recommendation:           - Patient has a contact number available for                            emergencies. The signs and symptoms of potential                            delayed complications were discussed with the                            patient. Return to normal activities tomorrow.                            Written discharge instructions were provided to the                            patient.                           - Resume previous diet.                           - Continue present medications.                           - Increase Lialda back to 4 tabs / day                           - Await pathology results. May need to discuss role                            of biologic therapy given active disease on Lialda,                            will discuss with the patient Amy Hansen. Amy Kluth, MD 10/06/2020 5:48:32 PM This report has been signed electronically.

## 2020-10-06 NOTE — Progress Notes (Signed)
A/ox3, pleased with MAC, report to RN 

## 2020-10-06 NOTE — Patient Instructions (Signed)
Read all of the handouts given to you by your recovery room nurse.  Be sure to take 4 tablets of lialda per day instead of 2.  The doctor will wait for path results and discuss further medications with you.  YOU HAD AN ENDOSCOPIC PROCEDURE TODAY AT [facility]:   Refer to the procedure report that was given to you for any specific questions about what was found during the examination.  If the procedure report does not answer your questions, please call your gastroenterologist to clarify.  If you requested that your care partner not be given the details of your procedure findings, then the procedure report has been included in a sealed envelope for you to review at your convenience later.  YOU SHOULD EXPECT: Some feelings of bloating in the abdomen. Passage of more gas than usual.  Walking can help get rid of the air that was put into your GI tract during the procedure and reduce the bloating. If you had a lower endoscopy (such as a colonoscopy or flexible sigmoidoscopy) you may notice spotting of blood in your stool or on the toilet paper. If you underwent a bowel prep for your procedure, you may not have a normal bowel movement for a few days.  Please Note:  You might notice some irritation and congestion in your nose or some drainage.  This is from the oxygen used during your procedure.  There is no need for concern and it should clear up in a day or so.  SYMPTOMS TO REPORT IMMEDIATELY:   Following lower endoscopy (colonoscopy or flexible sigmoidoscopy):  Excessive amounts of blood in the stool  Significant tenderness or worsening of abdominal pains  Swelling of the abdomen that is new, acute  Fever of 100F or higher   For urgent or emergent issues, a gastroenterologist can be reached at any hour by calling [phone]. Do not use MyChart messaging for urgent concerns.    DIET:  We do recommend a small meal at first, but then you may proceed to your regular diet.  Drink  plenty of fluids but you should avoid alcoholic beverages for 24 hours.  ACTIVITY:  You should plan to take it easy for the rest of today and you should NOT DRIVE or use heavy machinery until tomorrow (because of the sedation medicines used during the test).    FOLLOW UP: Our staff will call the number listed on your records 48-72 hours following your procedure to check on you and address any questions or concerns that you may have regarding the information given to you following your procedure. If we do not reach you, we will leave a message.  We will attempt to reach you two times.  During this call, we will ask if you have developed any symptoms of COVID 19. If you develop any symptoms (ie: fever, flu-like symptoms, shortness of breath, cough etc.) before then, please call [phone].  If you test positive for Covid 19 in the 2 weeks post procedure, please call and report this information to [name].    If any biopsies were taken you will be contacted by phone or by letter within the next 1-3 weeks.  Please call us at [phone] if you have not heard about the biopsies in 3 weeks.    SIGNATURES/CONFIDENTIALITY: You and/or your care partner have signed paperwork which will be entered into your electronic medical record.  These signatures attest to the fact that that the information above on your After  Visit Summary has been reviewed and is understood.  Full responsibility of the confidentiality of this discharge information lies with you and/or your care-partner.

## 2020-10-07 ENCOUNTER — Telehealth: Payer: Self-pay | Admitting: Gastroenterology

## 2020-10-07 MED ORDER — ONDANSETRON 4 MG PO TBDP: 4.0000 mg | ORAL_TABLET | Freq: Four times a day (QID) | ORAL | 3 refills | Status: DC | PRN

## 2020-10-07 NOTE — Telephone Encounter (Signed)
Pt is requesting a call back from a nurse to see if she can be prescribed something for her nausea/colitis.

## 2020-10-07 NOTE — Telephone Encounter (Signed)
Lm on vm for patient to return call 

## 2020-10-07 NOTE — Telephone Encounter (Signed)
Let's try Zofran 43m ODT every 6 hours PRN #30 RF#3.  I will call her when biopsies come back from her procedures to discuss next steps in management of her colitis. Thanks

## 2020-10-07 NOTE — Telephone Encounter (Signed)
Spoke with patient's husband, he is aware that we have sent in a prescription for Zofran and advised that we will be in contact with her once the biopsy results come back to discuss next steps of management. Husband verbalized understanding of all information, he will relay the information to patient when he returns home, he had no concerns at the end of the call.  Prescription for Zofran sent to pharmacy on file.

## 2020-10-07 NOTE — Telephone Encounter (Signed)
Spoke with patient, reports that nausea started several days before procedure, comes and goes, when she is having an episode of nausea she is not able to eat, no vomiting. Patient states that since she is having a colitis flare she is not able to eat much anyway but is taking Lialda 4 tablets a day. Patient would like medication for nausea, please advise. Thanks.

## 2020-10-10 ENCOUNTER — Telehealth: Payer: Self-pay | Admitting: Gastroenterology

## 2020-10-10 MED ORDER — PREDNISONE 10 MG PO TABS: ORAL_TABLET | ORAL | 0 refills | Status: DC

## 2020-10-10 NOTE — Telephone Encounter (Signed)
Sorry to hear about this. Colonoscopy done recently showed active colitis. I had asked her to increase Lialda to 4 tabs per day.  If that is not helping and her symptoms are consistent for her with typical colitis symptoms, we can give her prednisone 40 mg a day for 2 weeks followed by a slow taper of 5 mg/week until done.  I am awaiting biopsy results and will contact her with those, we may need to consider biologic therapy moving forward.  Thanks.  If she is feeling poorly in the interim despite the prednisone she is to contact us.  Thanks

## 2020-10-10 NOTE — Telephone Encounter (Signed)
Spoke with patient, she reports mid left sided abdominal pain, sharp pain yesterday but has eased off through the night, no diarrhea, some nausea but she has a prescription for Zofran. She states that when she was in the hospital with a flare before they prescribed her Prednisone which seemed to help, she reports taking Tylenol about 30 minutes ago. Patient requesting a prescription for prednisone. Please advise, thank you.

## 2020-10-10 NOTE — Telephone Encounter (Signed)
Spoke with patient in regards to Dr. Doyne Keel recommendations. Patient has increased Lialda to 4 tabs daily and still having symptoms. Patient is aware that I have sent in prescription for Prednisone taper, she has been advised that if she is still not feeling well despite Prednisone she will need to call and let us know. Patient is aware that we will reach out to her once her pathology results have returned, she is aware that a biologic therapy may be considered. Patient verbalized understanding and had no concerns at the end of the call.

## 2020-10-11 ENCOUNTER — Telehealth: Payer: Self-pay

## 2020-10-11 NOTE — Telephone Encounter (Signed)
First attempt follow up call to pt, voicemail full

## 2020-10-11 NOTE — Telephone Encounter (Signed)
  Follow up Call-  Call back number 10/06/2020  Post procedure Call Back phone  # [phone]  Permission to leave phone message Yes  Some recent data might be hidden     Called patient and got voicemail but mailbox is full

## 2020-10-17 ENCOUNTER — Telehealth: Payer: Self-pay | Admitting: Gastroenterology

## 2020-10-17 ENCOUNTER — Other Ambulatory Visit: Payer: Self-pay | Admitting: *Deleted

## 2020-10-17 ENCOUNTER — Telehealth: Payer: Self-pay

## 2020-10-17 MED ORDER — FUROSEMIDE 20 MG PO TABS: 20.0000 mg | ORAL_TABLET | Freq: Every day | ORAL | 1 refills | Status: DC

## 2020-10-17 NOTE — Chronic Care Management (AMB) (Signed)
  Care Management   Note  10/17/2020 Name: Amy Hansen MRN: [id number] DOB: 1955-08-02  Amy Hansen is a 65 y.o. year old female who is a primary care patient of [name], NP and is actively engaged with the care management team. I reached out to [name] by phone today to assist with re-scheduling a follow up visit with the RN Case Manager  Follow up plan: Unsuccessful telephone outreach attempt made. A HIPAA compliant phone message was left for the patient providing contact information and requesting a return call.  The care management team will reach out to the patient again over the next 7 days.  If patient returns call to provider office, please advise to call [facility] at [place], [place], [place], [place] [address] Direct Dial: [phone] [email or web address]: [email or web address]

## 2020-10-17 NOTE — Telephone Encounter (Signed)
Patient requesting refill for furosemide.  Patient needs office visit for further refills. 30-day supply sent in with message to contact office for further refills.

## 2020-10-17 NOTE — Telephone Encounter (Signed)
Patient has returned call and is requesting to be contacted again when possible Please contact to advise further at [phone]

## 2020-10-18 ENCOUNTER — Telehealth: Payer: Self-pay

## 2020-10-18 NOTE — Telephone Encounter (Signed)
Spoke with patient, see 10/06/20 pathology result note for more information.

## 2020-10-18 NOTE — Chronic Care Management (AMB) (Signed)
  Care Management   Note  10/18/2020 Name: Amy Hansen MRN: [id number] DOB: 01-09-1956  [name] is a 65 y.o. year old female who is a primary care patient of [name], NP and is actively engaged with the care management team. I reached out to [name] by phone today to assist with re-scheduling a follow up visit with the RN Case Manager  Follow up plan: Unsuccessful telephone outreach attempt made. The care management team will reach out to the patient again over the next 7 days.  If patient returns call to provider office, please advise to call [facility]  at [place], [place], [place], [place] [address] Direct Dial: [phone] [email or web address]

## 2020-10-18 NOTE — Telephone Encounter (Signed)
Patient is returning your call for path results.

## 2020-10-19 ENCOUNTER — Other Ambulatory Visit: Payer: Self-pay | Admitting: Physician Assistant

## 2020-10-19 NOTE — Telephone Encounter (Signed)
Controlled substance 

## 2020-10-25 ENCOUNTER — Other Ambulatory Visit (INDEPENDENT_AMBULATORY_CARE_PROVIDER_SITE_OTHER): Payer: Self-pay

## 2020-10-25 ENCOUNTER — Ambulatory Visit (INDEPENDENT_AMBULATORY_CARE_PROVIDER_SITE_OTHER): Payer: Self-pay | Admitting: Gastroenterology

## 2020-10-25 ENCOUNTER — Other Ambulatory Visit: Payer: Self-pay

## 2020-10-25 VITALS — BP 138/70 | HR 108 | Wt 130.0 lb

## 2020-10-25 DIAGNOSIS — K523 Indeterminate colitis: Secondary | ICD-10-CM

## 2020-10-25 LAB — BASIC METABOLIC PANEL
BUN: 14 mg/dL (ref 6–23)
CO2: 23 mEq/L (ref 19–32)
Calcium: 8.6 mg/dL (ref 8.4–10.5)
Chloride: 98 mEq/L (ref 96–112)
Creatinine, Ser: 0.83 mg/dL (ref 0.40–1.20)
GFR: 75.04 mL/min (ref >60.00)
Glucose, Bld: 457 mg/dL — ABNORMAL HIGH (ref 70–99)
Potassium: 4.1 mEq/L (ref 3.5–5.1)
Sodium: 133 mEq/L — ABNORMAL LOW (ref 135–145)

## 2020-10-25 MED ORDER — CURCUMIN POWD: 0 refills | Status: DC

## 2020-10-25 NOTE — Patient Instructions (Addendum)
If you are age 64 or older, your body mass index should be between 23-30. Your Body mass index is 25.39 kg/m. If this is out of the aforementioned range listed, please consider follow up with your Primary Care Provider.  If you are age 64 or younger, your body mass index should be between 19-25. Your Body mass index is 25.39 kg/m. If this is out of the aformentioned range listed, please consider follow up with your Primary Care Provider.   Continue Lialda 4.8 g daily  Please go to the lab in the basement of our building to have lab work done as you leave today. Hit "B" for basement when you get on the elevator.  When the doors open the lab is on your left.  We will call you with the results. Thank you.  Due to recent changes in healthcare laws, you may see the results of your imaging and laboratory studies on MyChart before your provider has had a chance to review them.  We understand that in some cases there may be results that are confusing or concerning to you. Not all laboratory results come back in the same time frame and the provider may be waiting for multiple results in order to interpret others.  Please give [name] 48 hours in order for your provider to thoroughly review all the results before contacting the office for clarification of your results.   Continue the prednisone taper - decrease by 5 mg each week until gone.  Please purchase the following medications over the counter and take as directed: Curcumin  We have scheduled you for a Follow up appointment on Monday, 6-27 at 10:10am. Please call to reschedule if this is not convenient. [phone].   Thank you for entrusting me with your care and for choosing [facility], Dr. Assumption Cellar

## 2020-10-25 NOTE — Progress Notes (Signed)
HPI :  65 year old female here for a follow-up visit for colitis.  Recall she has a history of gastric bypass surgery in 2010.  I know her from when she was admitted to the hospital in October 21 for iron deficiency anemia and diarrhea/colitis.  She had an infectious work-up performed which was negative.  CT scan showed pancolitis at the time.  She underwent a colonoscopy with Dr. Ardis Hughs showing focal active nonspecific ileitis (on biopsies only - endoscopically ileum was normal), patchy nonspecific colitis in the right and left colon on biopsies, pancolonic inflammation grossly.  She was started on prednisone and then transition eventually to Lialda maintenance therapy.  She has received a few doses of IV iron.  Over time she had done well with steroids and transitioning to Lialda.  When I saw her in the office in February she was doing really well, her anemia had gone away and iron studies normalized.  During her hospitalization she had some elevated liver enzymes which was worked up with serologies.  AMA was mildly elevated and ANA was positive however smooth muscle AB negative as well as IgG.  With treatment of her colitis her liver enzymes have normalized. Around the time she was diagnosed with colitis she was taking Excedrin twice daily for migraines as well as daily use of Advil and aspirin.  She has since stopped all NSAIDs since her diagnosis.    She had a colonoscopy with me on March 10 for surveillance.  She states about a week prior to that colonoscopy she started developing flare of symptoms.  She had increased stool frequency along with abdominal pain and cramping.  Colonoscopy showed a normal ileum, but with pancolitis that is active, worse in the left colon.  Biopsies showed chronic active colitis throughout.  I placed her back on prednisone 40 mg for 2 weeks, she has started tapering by 5 mg/week, currently on 35 mg a day.  Also we increased her Lialda to 4 tabs per day.  She is in the  office today to discuss biologic therapy.  She tells me today that she does not have any healthcare insurance.  She is applying for a job that would provide her benefits and she is hopeful that may come through in the upcoming weeks.   Prior work-up  Colonoscopy 05/03/2020: - Mild to moderate inflammation from anus to cecum with a normal appearing terminal ileum. Multiple biopsies taken.  A. SMALL BOWEL, TERMINAL ILEUM, BIOPSY:  - Focally active nonspecific ileitis  - Negative for features of chronicity or granulomas   B. COLON, RIGHT, BIOPSY:  - Patchy active nonspecific colitis with focal ulceration  - Focal microscopic aggregate of epithelioid histiocytes suggestive of a  microadenoma  - See comment   C. COLON, LEFT, BIOPSY:  - Patchy mildly active nonspecific colitis  - Negative for features of chronicity or granulomas  COMMENT:   A, B and C.  Taken together, the ileocolonic biopsies show patchy  active colitis with microgranulomas within the right colon. Diagnostic  histologic features of idiopathic inflammatory bowel disease, including  lamina propria and basal lymphoplasmacytosis or crypt architectural  distortion are not present. Differential diagnosis can include  infection, drug effect and evolving/early inflammatory bowel disease  (e.g. Crohn's disease). AFB and GMS special stains are negative for  acid-fast bacilli and fungal organisms respectively.  Clinical-radiologic correlation and patient follow-up is suggested.    CTAP with contrast 04/21/2020: 1. Diffuse circumferential wall thickening of virtually all of the colon, most  notably at the level of the cecum and ascending colon, consistent with infectious or inflammatory colitis. 2. There is a punctate focus of gas within the urinary bladder. Correlate for history of recent instrumentation. Aortic Atherosclerosis    Colonoscopy 10/06/20 - The perianal and digital rectal examinations were  normal. - The terminal ileum appeared normal. - Patchy inflammation characterized by erosions, granularity, mucus and aphthous ulcerations was found in the entire colon, most prominent in the left colon. Biopsies were taken with a cold forceps for histology. - Scattered medium-mouthed diverticula were found in the entire colon. - The exam was otherwise without abnormality.  1. Surgical [P], random right sided sites - [place], DYSPLASIA OR MALIGNANCY IDENTIFIED 2. Surgical [P], colon, transverse - CHRONIC ACTIVE COLITIS - NO GRANULOMATA, DYSPLASIA OR MALIGNANCY IDENTIFIED 3. Surgical [P], random left sided sites - CHRONIC ACTIVE COLITIS - NO GRANULOMATA, DYSPLASIA OR MALIGNANCY IDENTIFIED    Past Medical History:  Diagnosis Date  . Anemia   . Anxiety   . Chronic kidney disease    UTI, hematuria in urine  . Colitis   . Crohn's disease ([place])   . Depression   . Diabetes ([place])   . Diverticulosis   . Frequent headaches   . Interstitial cystitis   . Recurrent UTI   . Restless leg syndrome   . Urinary frequency      Past Surgical History:  Procedure Laterality Date  . ABDOMINAL HYSTERECTOMY    . bariatric bypass  2012  . BIOPSY  05/03/2020   Procedure: BIOPSY;  Surgeon: Milus Banister, MD;  Location: [facility] ENDOSCOPY;  Service: Endoscopy;;  . CARPAL TUNNEL RELEASE Right 2003  . CARPAL TUNNEL RELEASE Right    2008  . CHOLECYSTECTOMY  1975  . COLONOSCOPY WITH PROPOFOL N/A 05/03/2020   Procedure: COLONOSCOPY WITH PROPOFOL;  Surgeon: Milus Banister, MD;  Location: [facility] ENDOSCOPY;  Service: Endoscopy;  Laterality: N/A;  . CYSTOSCOPY W/ RETROGRADES Bilateral 06/06/2015   Procedure: CYSTOSCOPY WITH RETROGRADE PYELOGRAM;  Surgeon: Festus Aloe, MD;  Location: [facility] ORS;  Service: Urology;  Laterality: Bilateral;  . FL INJ LEFT KNEE CT ARTHROGRAM ([facility] HX) Left    1995  . GASTRIC BYPASS  2010  . [place]  2013  . KNEE ARTHROSCOPY Left  1996  . TONSILLECTOMY     Family History  Problem Relation Age of Onset  . Stroke Father   . Colon cancer Mother   . Heart failure Sister   . Bladder Cancer Neg Hx   . Kidney disease Neg Hx   . Prostate cancer Neg Hx   . Kidney cancer Neg Hx   . Pancreatic cancer Neg Hx   . Esophageal cancer Neg Hx   . Stomach cancer Neg Hx   . Rectal cancer Neg Hx    Social History   Tobacco Use  . Smoking status: Former Smoker    Packs/day: 0.50    Types: Cigarettes    Quit date: 04/25/1975    Years since quitting: 45.5  . Smokeless tobacco: Never Used  . Tobacco comment: quit 40 years  Vaping Use  . Vaping Use: Never used  Substance Use Topics  . Alcohol use: No    Alcohol/week: 0.0 standard drinks  . Drug use: No   Current Outpatient Medications  Medication Sig Dispense Refill  . ALPRAZolam (XANAX) 0.5 MG tablet TAKE 1 TABLET BY MOUTH 4 TIMES DAILY AS NEEDED FOR ANXIETY 120 tablet 5  . buPROPion [facility]  XL) 150 MG 24 hr tablet Take 3 tablets by mouth once daily 270 tablet 3  . Cholecalciferol (VITAMIN D) 50 MCG (2000 UT) CAPS Take 1 capsule (2,000 Units total) by mouth daily. 30 capsule   . estradiol (ESTRACE) 0.1 MG/GM vaginal cream Apply one pea-sized amount around the opening of the urethra three times weekly. 42.5 g 12  . ferrous sulfate 325 (65 FE) MG EC tablet Take 1 tablet (325 mg total) by mouth 2 (two) times daily. 60 tablet 3  . furosemide (LASIX) 20 MG tablet Take 1 tablet (20 mg total) by mouth daily. 30 tablet 1  . glipiZIDE (GLUCOTROL) 5 MG tablet TAKE 1 TABLET BY MOUTH TWICE DAILY WITH MEALS (APPOINTMENT  REQUIRED  FOR  FUTURE  REFILLS) (Patient taking differently: Take 5 mg by mouth 2 (two) times daily before a meal. TAKE 1 TABLET BY MOUTH TWICE DAILY WITH MEALS (APPOINTMENT  REQUIRED  FOR  FUTURE  REFILLS)) 180 tablet 0  . insulin glargine, 2 Unit Dial, (TOUJEO MAX SOLOSTAR) 300 UNIT/ML Solostar Pen Inject 24 units daily. Titrate as instructed. Max daily dose 40  units 12 mL 1  . Insulin Pen Needle (PEN NEEDLES) 32G X 4 MM MISC 1 Units by Does not apply route at bedtime. 100 each 1  . lamoTRIgine (LAMICTAL) 200 MG tablet Take 2 tablets (400 mg total) by mouth at bedtime. 180 tablet 1  . loperamide (IMODIUM) 2 MG capsule Take 1 capsule (2 mg total) by mouth as needed for diarrhea or loose stools. (Patient not taking: No sig reported) 30 capsule 0  . mesalamine (LIALDA) 1.2 g EC tablet Take 4.8 g by mouth daily with breakfast. 60 tablet 5  . metFORMIN (GLUCOPHAGE) 1000 MG tablet TAKE 1 TABLET BY MOUTH TWICE DAILY WITH MEALS APPOINTMENT  REQUIRED  FOR  FUTURE REFILLS 60 tablet 0  . nystatin (MYCOSTATIN/NYSTOP) powder Apply 1 application topically 3 (three) times daily. Apply to lower abdominal fold tid x 2 to 3 weeks 15 g 0  . ondansetron (ZOFRAN-ODT) 4 MG disintegrating tablet Take 1 tablet (4 mg total) by mouth every 6 (six) hours as needed for nausea. Place tablet under tongue 30 tablet 3  . pantoprazole (PROTONIX) 40 MG tablet Take 1 tablet (40 mg total) by mouth daily. 30 tablet 1  . pramipexole (MIRAPEX) 1 MG tablet Take 1 tablet (1 mg total) by mouth at bedtime. 90 tablet 1  . predniSONE (DELTASONE) 10 MG tablet Take 4 tablets (40 mg total) by mouth daily with breakfast for 14 days, THEN 3.5 tablets (35 mg total) daily with breakfast for 7 days, THEN 3 tablets (30 mg total) daily with breakfast for 7 days, THEN 2.5 tablets (25 mg total) daily with breakfast for 7 days, THEN 2 tablets (20 mg total) daily with breakfast for 7 days, THEN 1.5 tablets (15 mg total) daily with breakfast for 7 days, THEN 1 tablet (10 mg total) daily with breakfast for 7 days, THEN 0.5 tablets (5 mg total) daily with breakfast for 7 days. 160 tablet 0  . risperiDONE (RISPERDAL) 3 MG tablet Take 1 tablet (3 mg total) by mouth at bedtime. 30 tablet 5  . sertraline (ZOLOFT) 100 MG tablet TAKE 1 & 1/2 (ONE & ONE-HALF) TABLETS BY MOUTH ONCE DAILY 135 tablet 1   Current  Facility-Administered Medications  Medication Dose Route Frequency Provider Last Rate Last Admin  . 0.9 %  sodium chloride infusion  500 mL Intravenous Once Artemis Loyal, Carlota Raspberry, MD  Allergies  Allergen Reactions  . Avelox [Moxifloxacin Hcl In Nacl] Anaphylaxis  . Bactrim [Sulfamethoxazole-Trimethoprim] Anaphylaxis  . Ciprofloxacin Other (See Comments)    Pt states she was told never to take this as it is in the same family as Avelox.   . Depakote [Divalproex Sodium]   . Imitrex [Sumatriptan] Other (See Comments)    Neck and shoulder pain  . Stadol [Butorphanol] Rash     Review of Systems: All systems reviewed and negative except where noted in HPI.   Lab Results  Component Value Date   WBC 4.8 08/23/2020   HGB 12.2 08/23/2020   HCT 37.2 08/23/2020   MCV 88.8 08/23/2020   PLT 357.0 08/23/2020    Lab Results  Component Value Date   CREATININE 0.61 05/26/2020   BUN 5 (L) 05/26/2020   NA 138 05/26/2020   K 4.1 05/26/2020   CL 103 05/26/2020   CO2 21 05/26/2020    Lab Results  Component Value Date   ALT 21 08/23/2020   AST 15 08/23/2020   ALKPHOS 99 08/23/2020   BILITOT 0.2 08/23/2020     Physical Exam: BP 138/70   Pulse (!) 108   Wt 130 lb (59 kg)   LMP  (LMP Unknown)   BMI 25.39 kg/m  Constitutional: Pleasant,well-developed, female in no acute distress. Neurological: Alert and oriented to person place and time. Psychiatric: Normal mood and affect. Behavior is normal.   ASSESSMENT AND PLAN: 65 year old female here for reassessment of the following:  Indeterminate colitis - location fits ulcerative colitis but she had some aphthous ulcerations throughout her colon, grossly this looked more so consistent with Crohn's.  She unfortunately developed a flare on Lialda monotherapy just prior to her last colonoscopy.  She has been on steroids since that time and doing quite well.  Given her course over the past year, including hospitalization, severe IDA, now  with recurrent flare despite oral mesalamine, I think she probably warrants biologic therapy.  I discussed treatment options with her to include anti-TNF therapy, Rosalene Billings, thiopurines, etc. I discussed risks and benefits of all of these.  If she could pick any of them, her preference would be Entyvio given her disease location and safety profile of the drug.  Unfortunately she does not have insurance and I do not think any of these biologic therapies will be affordable right now. Potentially should could afford thiopurines but wants to hold off on those right now. She is hopeful she will get a job with benefits in the upcoming weeks and if that is the case we may be able to pursue biologic therapy relatively soon.  She will keep me posted on this.  In the interim she will continue Lialda 4 tabs a day, continue prednisone taper, decrease by 5 mg a week till gone.  We discussed potentially adding curcumin to the regimen as that has been shown to provide benefit in patients with colitis on Lialda.  I will see her back in the office in 3 months.  If she has a flare of her symptoms in the interim she should contact me.  Given she has been on mesalamine for a few months now we will recheck her renal function to ensure normal.  She agreed  Plan: - discussed importance of obtaining healthcare insurance if you want to use biologic therapy, she will work on this.  Hopefully she will get the job she applied for that has benefits with insurance, if not she may apply  for Medicaid.  She will contact me when she has insurance - continue Lialda 4.8gm / day - taper prednisone by 5 mg/week until done - can add curcumin supplementation to the Lialda - BMET today to make sure renal function stable - follow-up in the office in 3 months or sooner with issues  Raymond Cellar, MD [facility]

## 2020-10-26 ENCOUNTER — Ambulatory Visit: Payer: Self-pay | Admitting: General Practice

## 2020-10-26 ENCOUNTER — Telehealth: Payer: Self-pay | Admitting: Nurse Practitioner

## 2020-10-26 DIAGNOSIS — E1165 Type 2 diabetes mellitus with hyperglycemia: Secondary | ICD-10-CM

## 2020-10-26 NOTE — Telephone Encounter (Signed)
Can you get patient scheduled for a follow up

## 2020-10-26 NOTE — Patient Instructions (Signed)
Visit Information  Patient Care Plan: Depression (Adult)    Patient Care Plan: RNCM: Diabetes Type 2 (Adult)    Problem Identified: RNCM: Glycemic Management (Diabetes, Type 2)   Priority: Medium    Goal: RNCM: Glycemic Management Optimized   Priority: Medium  Note:   Objective:  Lab Results  Component Value Date   HGBA1C 7.7 (H) 04/21/2020 .   Lab Results  Component Value Date   CREATININE 0.61 05/26/2020   CREATININE 0.58 05/06/2020   CREATININE 0.51 05/03/2020 .   [name] No results found for: EGFR Current Barriers:  [name] Knowledge Deficits related to basic Diabetes pathophysiology and self care/management . Knowledge Deficits related to medications used for management of diabetes . Limited Social Support . Unable to independently manage DM . Does not contact provider office for questions/concerns Case Manager Clinical Goal(s):  [name] Collaboration with [name], NP regarding development and update of comprehensive plan of care as evidenced by provider attestation and co-signature . Inter-disciplinary care team collaboration (see longitudinal plan of care) . Over the next 120 days, patient will demonstrate improved adherence to prescribed treatment plan for diabetes self care/management as evidenced by:  . daily monitoring and recording of CBG  . adherence to ADA/ carb modified diet . exercise 2/3 days/week . adherence to prescribed medication regimen Interventions:  . Provided education to patient about basic DM disease process . Reviewed medications with patient and discussed importance of medication adherence . Discussed plans with patient for ongoing care management follow up and provided patient with direct contact information for care management team . Provided patient with written educational materials related to hypo and hyperglycemia and importance of correct treatment . Reviewed scheduled/upcoming provider appointments including: encouraged the patient to call and  get an appointment to see pcp . Advised patient, providing education and rationale, to check cbg daily  and record, calling pcp for findings outside established parameters.  10-26-2020: The patient told the care guide she had been having elevated blood sugars. Care guide reached out to the [facility] and ask for recommendations. RNCM sent messages by in basket to pcp and staff. The patient has an appointment for evaluation and recommendations on 10-27-2020.  The RNCM called the patient and the patient states she forget that she has been taking prednisone x 2 weeks for a colitis flare up and that is likely why her blood sugars are elevated. Advised the patient to go to the ER for nausea/vomiting, fruity smelling/tasting breath, confusion, elevated blood sugars, and changes in condition. The patient verbalized understanding. The patient advised also to keep her appointment with the pcp on 10-27-2020.  The patient verbalized understanding of recommendations. Will continue to monitor.  . Review of patient status, including review of consultants reports, relevant laboratory and other test results, and medications completed. Patient Goals/Self-Care Activities . Over the next 120 days, patient will:  - Self administers oral medications as prescribed Self administers insulin as prescribed Attends all scheduled provider appointments Checks blood sugars as prescribed and utilize hyper and hypoglycemia protocol as needed Adheres to prescribed ADA/carb modified - barriers to adherence to treatment plan identified - blood glucose monitoring encouraged - blood glucose readings reviewed - mutual A1C goal set or reviewed - resources required to improve adherence to care identified - self-awareness of signs/symptoms of hypo or hyperglycemia encouraged - use of blood glucose monitoring log promoted Follow Up Plan: Telephone follow up appointment with care management team member scheduled for: next quarter.    Task: RNCM:  Alleviate  Barriers to Glycemic Management   Note:   Care Management Activities:    - barriers to adherence to treatment plan identified - blood glucose monitoring encouraged - blood glucose readings reviewed - mutual A1C goal set or reviewed - resources required to improve adherence to care identified - self-awareness of signs/symptoms of hypo or hyperglycemia encouraged - use of blood glucose monitoring log promoted     Patient Care Plan: RNCM: Hypertension (Adult)    Problem Identified: RNCM: Hypertension (Hypertension)   Priority: Medium    Goal: RNCM: Hypertension Monitored   Priority: Medium  Note:   Objective:  . Last practice recorded BP readings:  BP Readings from Last 3 Encounters:  07/12/20 110/64  05/30/20 108/60  05/26/20 111/71 .   [name] Most recent eGFR/CrCl: No results found for: EGFR  No components found for: CRCL Current Barriers:  [name] Knowledge Deficits related to basic understanding of hypertension pathophysiology and self care management . Knowledge Deficits related to understanding of medications prescribed for management of hypertension . Limited Social Support . Unable to self administer medications as prescribed . Lacks social connections . Does not contact provider office for questions/concerns Case Manager Clinical Goal(s):  [name] Over the next 120 days, patient will verbalize understanding of plan for hypertension management . Over the next 120 days, patient will demonstrate improved adherence to prescribed treatment plan for hypertension as evidenced by taking all medications as prescribed, monitoring and recording blood pressure as directed, adhering to low sodium/DASH diet . Over the next 120 days, patient will demonstrate improved health management independence as evidenced by checking blood pressure as directed and notifying PCP if SBP>160 or DBP > 90, taking all medications as prescribe, and adhering to a low sodium diet as discussed. Interventions:   . Collaboration with [name], NP regarding development and update of comprehensive plan of care as evidenced by provider attestation and co-signature . Inter-disciplinary care team collaboration (see longitudinal plan of care) . Evaluation of current treatment plan related to hypertension self management and patient's adherence to plan as established by provider. . Provided education to patient re: stroke prevention, s/s of heart attack and stroke, DASH diet, complications of uncontrolled blood pressure . Reviewed medications with patient and discussed importance of compliance . Discussed plans with patient for ongoing care management follow up and provided patient with direct contact information for care management team . Advised patient, providing education and rationale, to monitor blood pressure daily and record, calling PCP for findings outside established parameters.  Patient Goals/Self-Care Activities . Over the next 120 days, patient will:  - Self administers medications as prescribed Attends all scheduled provider appointments Calls provider office for new concerns, questions, or BP outside discussed parameters Checks BP and records as discussed Follows a low sodium diet/DASH diet - blood pressure trends reviewed - depression screen reviewed - home or ambulatory blood pressure monitoring encouraged Follow Up Plan: Telephone follow up appointment with care management team member scheduled for: 10-18-2020 at 0900 am   Task: RNCM: Identify and Monitor Blood Pressure Elevation   Note:   Care Management Activities:    - blood pressure trends reviewed - depression screen reviewed - home or ambulatory blood pressure monitoring encouraged       Patient Care Plan: RNCM: Effective management of Colitis/Chron's    Problem Identified: RNCM: Health Promotion or Disease Self-Management (General Plan of Care) Colitis and Chron's management   Priority: High    Goal: RNCM:  Self-Management Plan Developed   Priority: High  Note:   Current Barriers:  [name] Knowledge Deficits related to resources and education on effective management of colitis and chron's  . Chronic Disease Management support and education needs related to colitis and chron's . Lacks caregiver support.  . Unable to independently manage exacerbations of colitis/chron's . Lacks social connections . Does not contact provider office for questions/concerns  Nurse Case Manager Clinical Goal(s):  [name] Over the next 120 days, patient will verbalize understanding of plan for effective management of chron's and colitis  . Over the next 120 days, patient will work with Bruno, and pcp to address needs related to Management of chron's and colitis . Over the next 120 days, patient will demonstrate a decrease in colitis/chron's  exacerbations as evidenced by effective management of sx/sx, taking medications as prescribed, managing diet, and keeping appointments  . Over the next 120 days, patient will verbalize basic understanding of colitis/chron's  disease process and self health management plan as evidenced by no acute exacerbations and stabilization of disease process  Interventions:  . 1:1 collaboration with [name], NP regarding development and update of comprehensive plan of care as evidenced by provider attestation and co-signature . Inter-disciplinary care team collaboration (see longitudinal plan of care) . Evaluation of current treatment plan related to effective management of colitis and chron's disease  and patient's adherence to plan as established by provider. . Advised patient to call the provider for changes in condition, worsening sx/sx or exacerbations  . Provided education to patient re: dietary support with colitis and chron's . Discussed plans with patient for ongoing care management follow up and provided patient with direct contact information for care management team  Patient  Goals/Self-Care Activities Over the next 120 days, patient will:  - Patient will self administer medications as prescribed Patient will attend all scheduled provider appointments Patient will call pharmacy for medication refills Patient will call provider office for new concerns or questions Patient will work with BSW to address care coordination needs and will continue to work with the clinical team to address health care and disease management related needs.   - barriers to meeting goals identified - choices provided - collaboration with team encouraged - decision-making supported - health risks reviewed - problem-solving facilitated - questions answered - readiness for change evaluated - reassurance provided - resources needed to meet goals identified - self-reflection promoted - self-reliance encouraged - verbalization of feelings encouraged  Follow Up Plan: Telephone follow up appointment with care management team member scheduled for: 10-19-2020 at 0900       Task: RNCM: Mutually Develop and Royce Macadamia Achievement of Patient Goals   Note:   Care Management Activities:    - barriers to meeting goals identified - choices provided - collaboration with team encouraged - decision-making supported - health risks reviewed - problem-solving facilitated - questions answered - readiness for change evaluated - reassurance provided - resources needed to meet goals identified - self-reflection promoted - self-reliance encouraged - verbalization of feelings encouraged    Notes: new diagnosis in September 2021 of chron's colitis     Patient verbalizes understanding of instructions provided today and agrees to view in [place].   Telephone follow up appointment with care management team member scheduled for: next quarter   Noreene Larsson RN, MSN, [facility] Mobile: [phone]

## 2020-10-26 NOTE — Telephone Encounter (Signed)
-----   Message from Vanita Ingles sent at 10/26/2020 12:27 PM EDT ----- Regarding: Patient with blood sugar >500 Hello, The scheduling care guide reached out to me and had the patient on the phone. The patient states her blood sugars are >500 and she took 20 units of insulin.  She has tried to call the office but could not get through. Asking for call back with recommendations.  Thanks, Pam

## 2020-10-26 NOTE — Telephone Encounter (Signed)
Please make patient an appointment.  If she is having symptoms of hyperglycemia such Fruity-smelling breath, Nausea and vomiting, Shortness of breath, Dry mouth, Weakness, Confusion she should be evaluated in the ER.

## 2020-10-26 NOTE — Chronic Care Management (AMB) (Signed)
Care Management    RN Visit Note  10/26/2020 Name: Amy Hansen MRN: [id number] DOB: [birth year]  Subjective: Amy Hansen is a 65 y.o. year old female who is a primary care patient of Amy Billings, NP. The care management team was consulted for assistance with disease management and care coordination needs.    Engaged with patient by telephone for follow up visit in response to provider referral for case management and/or care coordination services.   Consent to Services:   [name] was given information about Care Management services today including:  1. Care Management services includes personalized support from designated clinical staff supervised by her physician, including individualized plan of care and coordination with other care providers 2. 24/7 contact phone numbers for assistance for urgent and routine care needs. 3. The patient may stop case management services at any time by phone call to the office staff.  Patient agreed to services and consent obtained.   Assessment: Review of patient past medical history, allergies, medications, health status, including review of consultants reports, laboratory and other test data, was performed as part of comprehensive evaluation and provision of chronic care management services.   SDOH (Social Determinants of Health) assessments and interventions performed:    Care Plan  Allergies  Allergen Reactions  . Avelox [Moxifloxacin Hcl In Nacl] Anaphylaxis  . Bactrim [Sulfamethoxazole-Trimethoprim] Anaphylaxis  . Ciprofloxacin Other (See Comments)    Pt states she was told never to take this as it is in the same family as Avelox.   . Depakote [Divalproex Sodium]   . Imitrex [Sumatriptan] Other (See Comments)    Neck and shoulder pain  . Stadol [Butorphanol] Rash    Outpatient Encounter Medications as of 10/26/2020  Medication Sig  . ALPRAZolam (XANAX) 0.5 MG tablet TAKE 1 TABLET BY MOUTH 4 TIMES DAILY AS NEEDED FOR ANXIETY   . buPROPion (WELLBUTRIN XL) 150 MG 24 hr tablet Take 3 tablets by mouth once daily  . Cholecalciferol (VITAMIN D) 50 MCG (2000 UT) CAPS Take 1 capsule (2,000 Units total) by mouth daily.  [name] estradiol (ESTRACE) 0.1 MG/GM vaginal cream Apply one pea-sized amount around the opening of the urethra three times weekly.  . ferrous sulfate 325 (65 FE) MG EC tablet Take 1 tablet (325 mg total) by mouth 2 (two) times daily.  . furosemide (LASIX) 20 MG tablet Take 1 tablet (20 mg total) by mouth daily.  [name] glipiZIDE (GLUCOTROL) 5 MG tablet TAKE 1 TABLET BY MOUTH TWICE DAILY WITH MEALS (APPOINTMENT  REQUIRED  FOR  FUTURE  REFILLS) (Patient taking differently: Take 5 mg by mouth 2 (two) times daily before a meal. TAKE 1 TABLET BY MOUTH TWICE DAILY WITH MEALS (APPOINTMENT  REQUIRED  FOR  FUTURE  REFILLS))  . insulin glargine, 2 Unit Dial, (TOUJEO MAX SOLOSTAR) 300 UNIT/ML Solostar Pen Inject 24 units daily. Titrate as instructed. Max daily dose 40 units  . Insulin Pen Needle (PEN NEEDLES) 32G X 4 MM MISC 1 Units by Does not apply route at bedtime.  . lamoTRIgine (LAMICTAL) 200 MG tablet Take 2 tablets (400 mg total) by mouth at bedtime.  [name] loperamide (IMODIUM) 2 MG capsule Take 1 capsule (2 mg total) by mouth as needed for diarrhea or loose stools. (Patient not taking: No sig reported)  . mesalamine (LIALDA) 1.2 g EC tablet Take 4.8 g by mouth daily with breakfast.  . metFORMIN (GLUCOPHAGE) 1000 MG tablet TAKE 1 TABLET BY MOUTH TWICE DAILY WITH MEALS APPOINTMENT  REQUIRED  FOR  FUTURE REFILLS  . nystatin (MYCOSTATIN/NYSTOP) powder Apply 1 application topically 3 (three) times daily. Apply to lower abdominal fold tid x 2 to 3 weeks  . ondansetron (ZOFRAN-ODT) 4 MG disintegrating tablet Take 1 tablet (4 mg total) by mouth every 6 (six) hours as needed for nausea. Place tablet under tongue  . pantoprazole (PROTONIX) 40 MG tablet Take 1 tablet (40 mg total) by mouth daily.  . pramipexole (MIRAPEX) 1 MG tablet Take 1  tablet (1 mg total) by mouth at bedtime.  . predniSONE (DELTASONE) 10 MG tablet Take 4 tablets (40 mg total) by mouth daily with breakfast for 14 days, THEN 3.5 tablets (35 mg total) daily with breakfast for 7 days, THEN 3 tablets (30 mg total) daily with breakfast for 7 days, THEN 2.5 tablets (25 mg total) daily with breakfast for 7 days, THEN 2 tablets (20 mg total) daily with breakfast for 7 days, THEN 1.5 tablets (15 mg total) daily with breakfast for 7 days, THEN 1 tablet (10 mg total) daily with breakfast for 7 days, THEN 0.5 tablets (5 mg total) daily with breakfast for 7 days.  . risperiDONE (RISPERDAL) 3 MG tablet Take 1 tablet (3 mg total) by mouth at bedtime.  . sertraline (ZOLOFT) 100 MG tablet TAKE 1 & 1/2 (ONE & ONE-HALF) TABLETS BY MOUTH ONCE DAILY  . Turmeric, Curcuma Longa, (CURCUMIN) POWD Take as directed   Facility-Administered Encounter Medications as of 10/26/2020  Medication  . 0.9 %  sodium chloride infusion    Patient Active Problem List   Diagnosis Date Noted  . Abnormal CT scan, gastrointestinal tract   . BRBPR (bright red blood per rectum) 05/01/2020  . Weakness 04/25/2020  . Pancolitis ([place]) 04/25/2020  . Type 2 diabetes mellitus ([place]) 04/25/2020  . Colitis 04/21/2020  . AKI (acute kidney injury) ([place]) 04/21/2020  . Major depression, chronic 06/01/2018  . OCD (obsessive compulsive disorder) 06/01/2018  . Chronic heart failure with preserved ejection fraction ([place]) 05/16/2018  . Left leg pain 11/04/2017  . GERD (gastroesophageal reflux disease) 02/18/2017  . Iron deficiency anemia 10/18/2016  . Leg swelling 07/17/2016  . Hypertension 03/21/2016  . Vitamin D deficiency 02/21/2016  . B12 deficiency 02/21/2016  . Poorly controlled type 2 diabetes mellitus ([place]) 09/13/2015  . Migraines 05/23/2015  . Restless legs syndrome (RLS) 05/23/2015  . Recurrent UTI 04/26/2015  . Atrophic vaginitis 04/26/2015  . Dyspareunia, female 04/26/2015    Conditions to be  addressed/monitored: DMII  Care Plan : RNCM: Diabetes Type 2 (Adult)  Updates made by Vanita Ingles since 10/26/2020 12:00 AM    Problem: RNCM: Glycemic Management (Diabetes, Type 2)   Priority: Medium    Goal: RNCM: Glycemic Management Optimized   Priority: Medium  Note:   Objective:  Lab Results  Component Value Date   HGBA1C 7.7 (H) 04/21/2020 .   Lab Results  Component Value Date   CREATININE 0.61 05/26/2020   CREATININE 0.58 05/06/2020   CREATININE 0.51 05/03/2020 .   [name] No results found for: EGFR Current Barriers:  [name] Knowledge Deficits related to basic Diabetes pathophysiology and self care/management . Knowledge Deficits related to medications used for management of diabetes . Limited Social Support . Unable to independently manage DM . Does not contact provider office for questions/concerns Case Manager Clinical Goal(s):  [name] Collaboration with Amy Billings, NP regarding development and update of comprehensive plan of care as evidenced by provider attestation and co-signature . Inter-disciplinary care team collaboration (see longitudinal plan  of care) . Over the next 120 days, patient will demonstrate improved adherence to prescribed treatment plan for diabetes self care/management as evidenced by:  . daily monitoring and recording of CBG  . adherence to ADA/ carb modified diet . exercise 2/3 days/week . adherence to prescribed medication regimen Interventions:  . Provided education to patient about basic DM disease process . Reviewed medications with patient and discussed importance of medication adherence . Discussed plans with patient for ongoing care management follow up and provided patient with direct contact information for care management team . Provided patient with written educational materials related to hypo and hyperglycemia and importance of correct treatment . Reviewed scheduled/upcoming provider appointments including: encouraged the patient to call  and get an appointment to see pcp . Advised patient, providing education and rationale, to check cbg daily  and record, calling pcp for findings outside established parameters.  10-26-2020: The patient told the care guide she had been having elevated blood sugars. Care guide reached out to the [facility] and ask for recommendations. RNCM sent messages by in basket to pcp and staff. The patient has an appointment for evaluation and recommendations on 10-27-2020.  The RNCM called the patient and the patient states she forget that she has been taking prednisone x 2 weeks for a colitis flare up and that is likely why her blood sugars are elevated. Advised the patient to go to the ER for nausea/vomiting, fruity smelling/tasting breath, confusion, elevated blood sugars, and changes in condition. The patient verbalized understanding. The patient advised also to keep her appointment with the pcp on 10-27-2020.  The patient verbalized understanding of recommendations. Will continue to monitor.  . Review of patient status, including review of consultants reports, relevant laboratory and other test results, and medications completed. Patient Goals/Self-Care Activities . Over the next 120 days, patient will:  - Self administers oral medications as prescribed Self administers insulin as prescribed Attends all scheduled provider appointments Checks blood sugars as prescribed and utilize hyper and hypoglycemia protocol as needed Adheres to prescribed ADA/carb modified - barriers to adherence to treatment plan identified - blood glucose monitoring encouraged - blood glucose readings reviewed - mutual A1C goal set or reviewed - resources required to improve adherence to care identified - self-awareness of signs/symptoms of hypo or hyperglycemia encouraged - use of blood glucose monitoring log promoted Follow Up Plan: Telephone follow up appointment with care management team member scheduled for: next quarter.      Plan:  Telephone follow up appointment with care management team member scheduled for:  next quarter   Noreene Larsson RN, MSN, [facility] Mobile: [phone]

## 2020-10-27 ENCOUNTER — Ambulatory Visit (INDEPENDENT_AMBULATORY_CARE_PROVIDER_SITE_OTHER): Payer: Self-pay | Admitting: Nurse Practitioner

## 2020-10-27 ENCOUNTER — Other Ambulatory Visit: Payer: Self-pay

## 2020-10-27 ENCOUNTER — Encounter: Payer: Self-pay | Admitting: Nurse Practitioner

## 2020-10-27 ENCOUNTER — Telehealth: Payer: Self-pay | Admitting: Nurse Practitioner

## 2020-10-27 VITALS — BP 125/75 | HR 118 | Temp 98.2°F | Wt 130.0 lb

## 2020-10-27 DIAGNOSIS — Z794 Long term (current) use of insulin: Secondary | ICD-10-CM

## 2020-10-27 DIAGNOSIS — E119 Type 2 diabetes mellitus without complications: Secondary | ICD-10-CM

## 2020-10-27 DIAGNOSIS — E1165 Type 2 diabetes mellitus with hyperglycemia: Secondary | ICD-10-CM

## 2020-10-27 DIAGNOSIS — N898 Other specified noninflammatory disorders of vagina: Secondary | ICD-10-CM

## 2020-10-27 LAB — URINALYSIS, ROUTINE W REFLEX MICROSCOPIC
Bilirubin, UA: NEGATIVE
Ketones, UA: NEGATIVE
Nitrite, UA: NEGATIVE
Protein,UA: NEGATIVE
RBC, UA: NEGATIVE
Specific Gravity, UA: 1.005 (ref 1.005–1.030)
Urobilinogen, Ur: 0.2 mg/dL (ref 0.2–1.0)
pH, UA: 6 (ref 5.0–7.5)

## 2020-10-27 LAB — WET PREP FOR TRICH, YEAST, CLUE
Clue Cell Exam: NEGATIVE
Trichomonas Exam: NEGATIVE
Yeast Exam: NEGATIVE

## 2020-10-27 LAB — BAYER DCA HB A1C WAIVED: HB A1C (BAYER DCA - WAIVED): 8.7 % — ABNORMAL HIGH (ref <7.0)

## 2020-10-27 LAB — MICROSCOPIC EXAMINATION

## 2020-10-27 MED ORDER — TOUJEO MAX SOLOSTAR 300 UNIT/ML ~~LOC~~ SOPN
PEN_INJECTOR | SUBCUTANEOUS | 1 refills | Status: DC
Start: 2020-10-27 — End: 2021-02-22

## 2020-10-27 MED ORDER — DOXYCYCLINE HYCLATE 100 MG PO TABS: 100.0000 mg | ORAL_TABLET | Freq: Two times a day (BID) | ORAL | 0 refills | Status: AC

## 2020-10-27 NOTE — Assessment & Plan Note (Signed)
>>  ASSESSMENT AND PLAN FOR DIABETES MELLITUS TYPE 2 IN NONOBESE (HCC) WRITTEN ON 10/27/2020  8:40 AM BY HOLDSWORTH, KAREN, NP  Chronic.  Uncontrolled.  Patient is currently on Prednisone by GI for Colitis.  Lengthy discussion had about titrating insulin.  Patient instructed to do 40 u of insulin daily.  Need to titrate insulin to goal of <130.  Increase insulin every other day by 2u until goal of <130 is met. Max dose to inject at one time is 40u.  If patient requires more insulin to meet goal she will need to inject twice daily. Patient declined referral to Endocrinology due to not having insurance.  Emphasized the importance of checking blood sugars regularly. Return in 1 month to have medication adjusted.  ER precautions discussed with patient during visit.

## 2020-10-27 NOTE — Telephone Encounter (Signed)
Can you please let patient know that her a1c was 8.7%.  Not terrible but she only started the steroids two weeks ago so we may see it increase but titrating the insulin will help combat this.   She did not have a yeast infection but there were some bacteria in her urine so I sent doxycycline to the pharmacy. Twice daily for 7 days.  I set it for culture and may need to change the antibiotic but we will start with this.

## 2020-10-27 NOTE — Assessment & Plan Note (Signed)
Chronic.  Uncontrolled.  Patient is currently on Prednisone by GI for Colitis.  Lengthy discussion had about titrating insulin.  Patient instructed to do 40 u of insulin daily.  Need to titrate insulin to goal of <130.  Increase insulin every other day by 2u until goal of <130 is met. Max dose to inject at one time is 40u.  If patient requires more insulin to meet goal she will need to inject twice daily. Patient declined referral to Endocrinology due to not having insurance.  Emphasized the importance of checking blood sugars regularly. Return in 1 month to have medication adjusted.  ER precautions discussed with patient during visit.

## 2020-10-27 NOTE — Patient Instructions (Addendum)
40 u of insulin daily.  Need to titrate insulin to goal of <130.    Increase insulin every other day by 2u until goal of <130 is met.   Max dose to inject at one time is 50u.  If patient requires more insulin she will need to inject twice daily.

## 2020-10-27 NOTE — Telephone Encounter (Signed)
Patient notified

## 2020-10-27 NOTE — Progress Notes (Signed)
BP 125/75   Pulse (!) 118   Temp 98.2 F (36.8 C)   Wt 130 lb (59 kg)   LMP  (LMP Unknown)   SpO2 96%   BMI 25.39 kg/m    Subjective:    Patient ID: Amy Hansen, female    DOB: [birth year], 65 y.o.   MRN: [id number]  HPI: Amy Hansen is a 65 y.o. female  Chief Complaint  Patient presents with  . Diabetes    Currently on prednisone  . Vaginal Itching   DIABETES Patient states that she has not been checking her sugars.  She states that yesterday she wasn't feeling well she decided to check it and it was 504. Patient took 30u of insulin, an additional metformin, and glipizide.  Patient took another 30u of insulin later in the day and she came down to 109.  Patient is currently on Prednisone for about the last 2 weeks.  Patient will be on the Prednisone for about 50 more days.  Patient is taking Prednisone for colitis. She has had two episodes since October and two colonoscopies.  Denies visual disturbance, polyuria, polydipsia, chest pain and numbness.   Patient is having vaginal itching.  Would like to know if she has a yeast infection.  Denies dysuria, fever, or back pain.   Relevant past medical, surgical, family and social history reviewed and updated as indicated. Interim medical history since our last visit reviewed. Allergies and medications reviewed and updated.  Review of Systems  Eyes: Negative for visual disturbance.  Cardiovascular: Negative for chest pain.  Endocrine: Negative for polydipsia and polyuria.  Genitourinary: Negative for dysuria.       Vaginal itching  Musculoskeletal: Negative for gait problem.  Neurological: Negative for numbness.    Per HPI unless specifically indicated above     Objective:    BP 125/75   Pulse (!) 118   Temp 98.2 F (36.8 C)   Wt 130 lb (59 kg)   LMP  (LMP Unknown)   SpO2 96%   BMI 25.39 kg/m   Wt Readings from Last 3 Encounters:  10/27/20 130 lb (59 kg)  10/25/20 130 lb (59 kg)  10/06/20 135 lb (61.2 kg)     Physical Exam Vitals and nursing note reviewed.  Constitutional:      General: She is not in acute distress.    Appearance: Normal appearance. She is normal weight. She is not ill-appearing, toxic-appearing or diaphoretic.  HENT:     Head: Normocephalic.     Right Ear: External ear normal.     Left Ear: External ear normal.     Nose: Nose normal.     Mouth/Throat:     Mouth: Mucous membranes are moist.     Pharynx: Oropharynx is clear.  Eyes:     General:        Right eye: No discharge.        Left eye: No discharge.     Extraocular Movements: Extraocular movements intact.     Conjunctiva/sclera: Conjunctivae normal.     Pupils: Pupils are equal, round, and reactive to light.  Cardiovascular:     Rate and Rhythm: Normal rate and regular rhythm.     Heart sounds: No murmur heard.   Pulmonary:     Effort: Pulmonary effort is normal. No respiratory distress.     Breath sounds: Normal breath sounds. No wheezing or rales.  Musculoskeletal:     Cervical back: Normal range of motion and neck  supple.  Skin:    General: Skin is warm and dry.     Capillary Refill: Capillary refill takes less than 2 seconds.  Neurological:     General: No focal deficit present.     Mental Status: She is alert and oriented to person, place, and time. Mental status is at baseline.  Psychiatric:        Mood and Affect: Mood normal.        Behavior: Behavior normal.        Thought Content: Thought content normal.        Judgment: Judgment normal.     Results for orders placed or performed in visit on 46/27/03  Basic metabolic panel  Result Value Ref Range   Sodium 133 (L) 135 - 145 mEq/L   Potassium 4.1 3.5 - 5.1 mEq/L   Chloride 98 96 - 112 mEq/L   CO2 23 19 - 32 mEq/L   Glucose, Bld 457 (H) 70 - 99 mg/dL   BUN 14 6 - 23 mg/dL   Creatinine, Ser 0.83 0.40 - 1.20 mg/dL   GFR 75.04 >60.00 mL/min   Calcium 8.6 8.4 - 10.5 mg/dL      Assessment & Plan:   Problem List Items Addressed This  Visit      Endocrine   Poorly controlled type 2 diabetes mellitus ([place]) - Primary    Chronic.  Uncontrolled.  Patient is currently on Prednisone by GI for Colitis.  Lengthy discussion had about titrating insulin.  Patient instructed to do 40 u of insulin daily.  Need to titrate insulin to goal of <130.  Increase insulin every other day by 2u until goal of <130 is met. Max dose to inject at one time is 40u.  If patient requires more insulin to meet goal she will need to inject twice daily. Patient declined referral to Endocrinology due to not having insurance.  Emphasized the importance of checking blood sugars regularly. Return in 1 month to have medication adjusted.  ER precautions discussed with patient during visit.       Relevant Medications   insulin glargine, 2 Unit Dial, (TOUJEO MAX SOLOSTAR) 300 UNIT/ML Solostar Pen   Other Relevant Orders   Bayer DCA Hb A1c Waived   Basic metabolic panel   Type 2 diabetes mellitus (HCC)   Relevant Medications   insulin glargine, 2 Unit Dial, (TOUJEO MAX SOLOSTAR) 300 UNIT/ML Solostar Pen   Other Relevant Orders   Bayer DCA Hb A1c Waived   Basic metabolic panel    Other Visit Diagnoses    Vaginal itching       Wet prep and UA/UC obtained in office today.  Will treat patient based on results. Return to clinic if symptoms worsen or fail to improve.    Relevant Orders   WET PREP FOR [place], YEAST, CLUE   Urinalysis, Routine w reflex microscopic   Urine Culture       Follow up plan: Return in about 1 month (around 11/26/2020) for Blood sugar check .

## 2020-10-27 NOTE — Addendum Note (Signed)
Addended by: [name] on: 10/27/2020 10:12 AM   Modules accepted: Orders

## 2020-10-28 LAB — BASIC METABOLIC PANEL
BUN/Creatinine Ratio: 18 (ref 12–28)
BUN: 13 mg/dL (ref 8–27)
CO2: 17 mmol/L — ABNORMAL LOW (ref 20–29)
Calcium: 8.3 mg/dL — ABNORMAL LOW (ref 8.7–10.3)
Chloride: 98 mmol/L (ref 96–106)
Creatinine, Ser: 0.71 mg/dL (ref 0.57–1.00)
Glucose: 351 mg/dL — ABNORMAL HIGH (ref 65–99)
Potassium: 3.9 mmol/L (ref 3.5–5.2)
Sodium: 136 mmol/L (ref 134–144)
eGFR: 95 mL/min/{1.73_m2} (ref >59)

## 2020-10-28 NOTE — Progress Notes (Signed)
Can you please let patient know that her a1c was 8.7%.  Not terrible but she only started the steroids two weeks ago so we may see it increase but titrating the insulin will help combat this.  Calcium is a little bit high.  We will continue to monitor at future visits. Otherwise her lab work looks good.  Complete the course of antibiotics that were sent.  Follow up in one month.

## 2020-10-31 ENCOUNTER — Telehealth: Payer: Self-pay

## 2020-11-01 NOTE — Progress Notes (Signed)
Please let patient know that her urine shows she has a UTI.  She should complete the course of doxycycline that was prescribed.  Return to clinic if symptoms worsen or fail to improve.

## 2020-11-03 LAB — URINE CULTURE

## 2020-11-04 ENCOUNTER — Ambulatory Visit: Payer: Self-pay | Admitting: Nurse Practitioner

## 2020-11-04 ENCOUNTER — Other Ambulatory Visit: Payer: Self-pay

## 2020-11-04 DIAGNOSIS — N390 Urinary tract infection, site not specified: Secondary | ICD-10-CM

## 2020-11-04 DIAGNOSIS — A499 Bacterial infection, unspecified: Secondary | ICD-10-CM

## 2020-11-04 MED ORDER — CEFTRIAXONE SODIUM 1 G IJ SOLR
1.0000 g | Freq: Once | INTRAMUSCULAR | Status: AC
  Administered 2020-11-04: 1 g via INTRAMUSCULAR

## 2020-11-04 NOTE — Progress Notes (Signed)
Patient presents today for an injection of Ceftriaxone 1 g for UTI due to culture growth per pcp

## 2020-11-04 NOTE — Progress Notes (Signed)
Please call patient and let her know that the Doxycycline she is on is not effective for the bacteria that grew in her urine.  She needs to come in for an injection of Ceftriaxone 1G.  Also, she needs to see a specialist, specifically Urology due to this being a difficult bacteria to kill.  I understand that patient is self pay however this is very important.  Please let me know if she is not able to do this.  I would also like patient to come back in two weeks to repeat the urine culture if she is not able to get into see Urology.

## 2020-11-05 ENCOUNTER — Other Ambulatory Visit: Payer: Self-pay | Admitting: Nurse Practitioner

## 2020-11-05 ENCOUNTER — Other Ambulatory Visit: Payer: Self-pay | Admitting: Family Medicine

## 2020-11-05 DIAGNOSIS — E119 Type 2 diabetes mellitus without complications: Secondary | ICD-10-CM

## 2020-11-05 NOTE — Telephone Encounter (Signed)
Requested Prescriptions  Pending Prescriptions Disp Refills  . glipiZIDE (GLUCOTROL) 5 MG tablet [Pharmacy Med Name: glipiZIDE 5 MG Oral Tablet] 180 tablet 1    Sig: TAKE 1 TABLET BY MOUTH TWICE DAILY WITH MEALS (APPOINTMENT  REQUIRED  FOR  FUTURE  REFILLS)     Endocrinology:  Diabetes - Sulfonylureas Failed - 11/05/2020  6:30 AM      Failed - HBA1C is between 0 and 7.9 and within 180 days    Hemoglobin A1C  Date Value Ref Range Status  03/21/2016 7.3%  Final   HB A1C (BAYER DCA - WAIVED)  Date Value Ref Range Status  10/27/2020 8.7 (H) <7.0 % Final    Comment:                                          Diabetic Adult            <7.0                                       Healthy Adult        4.3 - 5.7                                                           (DCCT/NGSP) American Diabetes Association's Summary of Glycemic Recommendations for Adults with Diabetes: Hemoglobin A1c <7.0%. More stringent glycemic goals (A1c <6.0%) may further reduce complications at the cost of increased risk of hypoglycemia.          Passed - Valid encounter within last 6 months    Recent Outpatient Visits          Yesterday UTI (urinary tract infection), bacterial   [place], NP   1 week ago Poorly controlled type 2 diabetes mellitus ([place])   [facility] [name], NP   5 months ago Poorly controlled type 2 diabetes mellitus ([place])   [place] Eulogio Bear, NP   6 months ago Hospital discharge follow-up   [place], NP   6 months ago Poorly controlled type 2 diabetes mellitus ([place])   [place] Eulogio Bear, NP      Future Appointments            In 2 days Luana Shu [place]   In 3 weeks [name], NP [facility], [place]

## 2020-11-05 NOTE — Telephone Encounter (Signed)
Requested medications are due for refill today.  yes  Requested medications are on the active medications list.  yes  Last refill. 08/02/2020  Future visit scheduled.   Yes  Notes to clinic.  Courtesy refill already given.

## 2020-11-07 ENCOUNTER — Encounter: Payer: Self-pay | Admitting: Physician Assistant

## 2020-11-07 ENCOUNTER — Ambulatory Visit (INDEPENDENT_AMBULATORY_CARE_PROVIDER_SITE_OTHER): Payer: Self-pay | Admitting: Physician Assistant

## 2020-11-07 ENCOUNTER — Other Ambulatory Visit: Payer: Self-pay

## 2020-11-07 VITALS — BP 138/81 | HR 103 | Ht 60.0 in | Wt 133.0 lb

## 2020-11-07 DIAGNOSIS — R350 Frequency of micturition: Secondary | ICD-10-CM

## 2020-11-07 DIAGNOSIS — Z87440 Personal history of urinary (tract) infections: Secondary | ICD-10-CM

## 2020-11-07 DIAGNOSIS — N952 Postmenopausal atrophic vaginitis: Secondary | ICD-10-CM

## 2020-11-07 NOTE — Progress Notes (Signed)
11/07/2020 9:31 AM   Amy Hansen [birth year] [id number]  CC: Chief Complaint  Patient presents with  . Recurrent UTI   HPI: Amy Hansen is a 65 y.o. female with PMH recurrent UTI previously on suppressive Macrobid, IC, and hematuria who presents today for evaluation of possible UTI.  She was seen by her PCP 11 days ago for routine follow-up of her diabetes.  She reported vaginal pruritus at that time and UA was checked, which was notable for 2+ glucose and trace leukocyte esterase.  No urine microscopy was performed.  Urine culture grew Augmentin, cefazolin, and cefuroxime resistant Klebsiella aerogenes.  She was initially treated with doxycycline, however for unclear reasons this was felt to be culture inappropriate and she also received 1 dose of Rocephin IM in clinic.  Today she reports interim resolution of vaginal pruritus.  She clarifies that she had no dysuria at her recent PCP visit, nor does she have dysuria today.  She does have ongoing urinary frequency x7-8 and nocturia x2-3 associated with urgency and infrequent urge incontinence.  She wears absorbent pads at night for security, but rarely wets these.  Patient took Myrbetriq in the past for management of OAB, however she developed urinary retention requiring Foley catheterization per patient report.  No notes available to corroborate this.  Notably, she has been prescribed topical vaginal estrogen cream previously but has struggled to take this continuously.  In addition to her recent episode of vaginal pruritus, she reports ongoing vaginal dryness.  Additionally, patient reports frustration with her recurrent UTIs.  She wishes to undergo allergy testing to determine if her history of anaphylaxis to Bactrim is related to sulfamethoxazole or trimethoprim with the goal of initiating suppressive trimethoprim in the long run.  In-office UA today positive for 1+ glucose; urine microscopy pan negative.   PMH: Past Medical  History:  Diagnosis Date  . Anemia   . Anxiety   . Chronic kidney disease    UTI, hematuria in urine  . Colitis   . Crohn's disease ([place])   . Depression   . Diabetes ([place])   . Diverticulosis   . Frequent headaches   . Interstitial cystitis   . Recurrent UTI   . Restless leg syndrome   . Urinary frequency     Surgical History: Past Surgical History:  Procedure Laterality Date  . ABDOMINAL HYSTERECTOMY    . bariatric bypass  2012  . BIOPSY  05/03/2020   Procedure: BIOPSY;  Surgeon: Milus Banister, MD;  Location[facility] ENDOSCOPY;  Service: Endoscopy;;  . CARPAL TUNNEL RELEASE Right 2003  . CARPAL TUNNEL RELEASE Right    2008  . CHOLECYSTECTOMY  1975  . COLONOSCOPY WITH PROPOFOL N/A 05/03/2020   Procedure: COLONOSCOPY WITH PROPOFOL;  Surgeon: Milus Banister, MD;  Location: [facility] ENDOSCOPY;  Service: Endoscopy;  Laterality: N/A;  . CYSTOSCOPY W/ RETROGRADES Bilateral 06/06/2015   Procedure: CYSTOSCOPY WITH RETROGRADE PYELOGRAM;  Surgeon: Festus Aloe, MD;  Location: [facility] ORS;  Service: Urology;  Laterality: Bilateral;  . FL INJ LEFT KNEE CT ARTHROGRAM ([facility] HX) Left    1995  . GASTRIC BYPASS  2010  . [place]  2013  . KNEE ARTHROSCOPY Left 1996  . TONSILLECTOMY      Home Medications:  Allergies as of 11/07/2020      Reactions   Avelox [moxifloxacin Hcl In Nacl] Anaphylaxis   Bactrim [sulfamethoxazole-trimethoprim] Anaphylaxis   Ciprofloxacin Other (See Comments)   Pt states she was told never to take  this as it is in the same family as Avelox.    Depakote [divalproex Sodium]    Imitrex [sumatriptan] Other (See Comments)   Neck and shoulder pain   Stadol [butorphanol] Rash      Medication List       Accurate as of November 07, 2020  9:31 AM. If you have any questions, ask your nurse or doctor.        ALPRAZolam 0.5 MG tablet Commonly known as: XANAX TAKE 1 TABLET BY MOUTH 4 TIMES DAILY AS NEEDED FOR ANXIETY   buPROPion 150 MG 24 hr tablet Commonly  known as: WELLBUTRIN XL Take 3 tablets by mouth once daily   Curcumin Powd Take as directed   estradiol 0.1 MG/GM vaginal cream Commonly known as: ESTRACE Apply one pea-sized amount around the opening of the urethra three times weekly.   ferrous sulfate 325 (65 FE) MG EC tablet Take 1 tablet (325 mg total) by mouth 2 (two) times daily.   furosemide 20 MG tablet Commonly known as: LASIX Take 1 tablet (20 mg total) by mouth daily.   glipiZIDE 5 MG tablet Commonly known as: GLUCOTROL TAKE 1 TABLET BY MOUTH TWICE DAILY WITH MEALS (APPOINTMENT  REQUIRED  FOR  FUTURE  REFILLS)   lamoTRIgine 200 MG tablet Commonly known as: LAMICTAL Take 2 tablets (400 mg total) by mouth at bedtime.   loperamide 2 MG capsule Commonly known as: IMODIUM Take 1 capsule (2 mg total) by mouth as needed for diarrhea or loose stools.   mesalamine 1.2 g EC tablet Commonly known as: LIALDA Take 4.8 g by mouth daily with breakfast.   metFORMIN 1000 MG tablet Commonly known as: GLUCOPHAGE TAKE 1 TABLET BY MOUTH TWICE DAILY WITH MEALS (APPOINTMENT  REQUIRED  FOR  FUTURE  REFILLS)   nystatin powder Commonly known as: MYCOSTATIN/NYSTOP Apply 1 application topically 3 (three) times daily. Apply to lower abdominal fold tid x 2 to 3 weeks   ondansetron 4 MG disintegrating tablet Commonly known as: ZOFRAN-ODT Take 1 tablet (4 mg total) by mouth every 6 (six) hours as needed for nausea. Place tablet under tongue   pantoprazole 40 MG tablet Commonly known as: PROTONIX Take 1 tablet (40 mg total) by mouth daily.   Pen Needles 32G X 4 MM Misc 1 Units by Does not apply route at bedtime.   pramipexole 1 MG tablet Commonly known as: MIRAPEX Take 1 tablet (1 mg total) by mouth at bedtime.   predniSONE 10 MG tablet Commonly known as: DELTASONE Take 4 tablets (40 mg total) by mouth daily with breakfast for 14 days, THEN 3.5 tablets (35 mg total) daily with breakfast for 7 days, THEN 3 tablets (30 mg total)  daily with breakfast for 7 days, THEN 2.5 tablets (25 mg total) daily with breakfast for 7 days, THEN 2 tablets (20 mg total) daily with breakfast for 7 days, THEN 1.5 tablets (15 mg total) daily with breakfast for 7 days, THEN 1 tablet (10 mg total) daily with breakfast for 7 days, THEN 0.5 tablets (5 mg total) daily with breakfast for 7 days. Start taking on: October 10, 2020   risperiDONE 3 MG tablet Commonly known as: RISPERDAL Take 1 tablet (3 mg total) by mouth at bedtime.   sertraline 100 MG tablet Commonly known as: ZOLOFT TAKE 1 & 1/2 (ONE & ONE-HALF) TABLETS BY MOUTH ONCE DAILY   Toujeo Max SoloStar 300 UNIT/ML Solostar Pen Generic drug: insulin glargine (2 Unit Dial) Inject 24 units daily. Titrate  as instructed. Max daily dose 40 units   Vitamin D 50 MCG (2000 UT) Caps Take 1 capsule (2,000 Units total) by mouth daily.       Allergies:  Allergies  Allergen Reactions  . Avelox [Moxifloxacin Hcl In Nacl] Anaphylaxis  . Bactrim [Sulfamethoxazole-Trimethoprim] Anaphylaxis  . Ciprofloxacin Other (See Comments)    Pt states she was told never to take this as it is in the same family as Avelox.   . Depakote [Divalproex Sodium]   . Imitrex [Sumatriptan] Other (See Comments)    Neck and shoulder pain  . Stadol [Butorphanol] Rash    Family History: Family History  Problem Relation Age of Onset  . Stroke Father   . Colon cancer Mother   . Heart failure Sister   . Bladder Cancer Neg Hx   . Kidney disease Neg Hx   . Prostate cancer Neg Hx   . Kidney cancer Neg Hx   . Pancreatic cancer Neg Hx   . Esophageal cancer Neg Hx   . Stomach cancer Neg Hx   . Rectal cancer Neg Hx     Social History:   reports that she quit smoking about 45 years ago. Her smoking use included cigarettes. She smoked 0.50 packs per day. She has never used smokeless tobacco. She reports that she does not drink alcohol and does not use drugs.  Physical Exam: BP 138/81   Pulse (!) 103   Ht 5'  (1.524 m)   Wt 133 lb (60.3 kg)   LMP  (LMP Unknown)   BMI 25.97 kg/m   Constitutional:  Alert and oriented, no acute distress, nontoxic appearing HEENT: [place], AT Cardiovascular: No clubbing, cyanosis, or edema Respiratory: Normal respiratory effort, no increased work of breathing Skin: No rashes, bruises or suspicious lesions Neurologic: Grossly intact, no focal deficits, moving all 4 extremities Psychiatric: Normal mood and affect  Laboratory Data: Results for orders placed or performed in visit on 11/07/20  Microscopic Examination   Urine  Result Value Ref Range   WBC, UA None seen 0 - 5 /hpf   RBC 0-2 0 - 2 /hpf   Epithelial Cells (non renal) None seen 0 - 10 /hpf   Bacteria, UA None seen None seen/Few  Urinalysis, Complete  Result Value Ref Range   Specific Gravity, UA <1.005 (L) 1.005 - 1.030   pH, UA 5.5 5.0 - 7.5   Color, UA Yellow Yellow   Appearance Ur Clear Clear   Leukocytes,UA Negative Negative   Protein,UA Negative Negative/Trace   Glucose, UA 1+ (A) Negative   Ketones, UA Negative Negative   RBC, UA Negative Negative   Bilirubin, UA Negative Negative   Urobilinogen, Ur 0.2 0.2 - 1.0 mg/dL   Nitrite, UA Negative Negative   Microscopic Examination See below:    Assessment & Plan:   1. Urinary frequency Patient with a history of OAB wet not associated with dysuria.  We discussed various treatment options today including pharmacotherapy, InterStim, intravesical Botox, and PTNS.  Patient is hesitant to pursue pharmacotherapy given her history of retention on Myrbetriq in the past.  She reports minimal bother from her OAB symptoms and states she wishes to defer further treatment at this point.  I am in agreement with this plan. - Urinalysis, Complete  2. Atrophic vaginitis I again encouraged the patient to use her topical vaginal estrogen cream 3 times weekly and explained that this will help with vaginal dryness and her history of recurrent UTIs as below.  Additionally, I counseled her to use nonhormonal vaginal moisturizers on days she is not using topical estrogen for symptomatic relief.  3. History of recurrent UTI (urinary tract infection) Not clinically infected today, and not clinically infected at the time of her recent treatment.  With a recent positive urine culture in the absence of irritative voiding symptoms, I suspect she may be chronically colonized with Klebsiella.  On chart review, she has grown this organism numerous times on urine culture.  I again encouraged 3 times weekly vaginal estrogen as above.  I advised her to set an alarm on her phone to remind her to use this.  I will reach out to ID to discuss allergy testing against trimethoprim in case we wish to pursue this as a suppressive agent in the future, however I explained today that asymptomatic bacteriuria does not warrant antibiotic therapy.  Return if symptoms worsen or fail to improve.  Debroah Loop, PA-C  [facility] [address], [place], [place] [address] [phone]

## 2020-11-07 NOTE — Patient Instructions (Addendum)
For management of your recurrent UTIs and vaginal dryness: -Topical vaginal estrogen cream is the cornerstone of UTI prevention. It needs to be used consistently three times weekly for up to 3 months to work appropriately. I recommend setting an alarm on your phone with usage reminders. -If you are having bothersome vaginal dryness on the days you don't use vaginal estrogen, I recommend adding the nonhormonal vaginal moisturizer called Replens. It is available over the counter at the pharmacy.  For management of your overactive bladder: -I do not recommend trying Myrbetriq again given your history of retention on this medication. We could try another class of medications, called anticholinergics, starting with the lowest possible dose and monitoring you closely for adverse effects. We decided to defer this today. -Alternative treatments include Botox injections in the bladder, which carry the rare risk of incomplete bladder emptying for the duration of the medication's effect (~6 months); PTNS, which requires weekly treatments for 12 weeks followed by monthly maintenance treatments; and InterStim, the implantable device that would work to modulate the signal from your brain to your bladder. -If and when you decide to pursue any of the above, please let me know.  I will coordinate with my colleagues in infectious diseases and immunology to arrange allergy testing for antibiotics; I'll contact you when I have more information.

## 2020-11-07 NOTE — Telephone Encounter (Signed)
Pt last apt was on 11/04/20 does pt need another apt? Next apt on 11/28/2020

## 2020-11-08 LAB — MICROSCOPIC EXAMINATION
Bacteria, UA: NONE SEEN
Epithelial Cells (non renal): NONE SEEN /hpf (ref 0–10)
WBC, UA: NONE SEEN /hpf (ref 0–5)

## 2020-11-08 LAB — URINALYSIS, COMPLETE
Bilirubin, UA: NEGATIVE
Ketones, UA: NEGATIVE
Leukocytes,UA: NEGATIVE
Nitrite, UA: NEGATIVE
Protein,UA: NEGATIVE
RBC, UA: NEGATIVE
Specific Gravity, UA: <1.005 — ABNORMAL LOW (ref 1.005–1.030)
Urobilinogen, Ur: 0.2 mg/dL (ref 0.2–1.0)
pH, UA: 5.5 (ref 5.0–7.5)

## 2020-11-10 ENCOUNTER — Ambulatory Visit: Payer: Self-pay | Admitting: Licensed Clinical Social Worker

## 2020-11-10 NOTE — Patient Instructions (Signed)
Licensed Clinical Social Worker Visit Information  Goals we discussed today:  Goals Addressed            This Visit's Progress   . SW-Track and Manage My Symptoms       Timeframe:  Long-Range Goal Priority:  Medium Start Date:   11/10/20                       Expected End Date:  02/09/21                     Follow Up Date- 01/09/21  Current barriers:   . Chronic Mental Health needs related to Depression and Anxiety  Needs Support, Education, and Care Coordination in order to meet unmet mental health needs. Amy Hansen knowledge of where and how to connect for mental health support resources  Clinical Goal(s): Over the next 120 days, patient will work with SW to reduce or manage symptoms of anxiety and depression and increase knowledge and/or ability of: coping skills, healthy habits, self-management skills, and stress reduction.until connected for ongoing counseling.   Clinical Interventions:  . Assessed patient's previous treatment, needs, coping skills, current treatment, support system and barriers to care  .  brief mental health assessment;Suicidal Ideation/Homicidal Ideation: No ) . Provided basic mental health support, education  . Provided interventions: Active listening / Reflection utilized , Emotional Supportive Provided, Behavioral Activation, Motivational Interviewing, and Verbalization of feelings encouraged  ; . Patient interviewed and appropriate assessments performed . Assisted patient/caregiver with obtaining information about health plan benefits . Provided education and assistance to client regarding Advanced Directives. . Provided education to patient/caregiver regarding level of care options. . Discussed several options for long term counseling based on need and insurance.  . Reviewed mental health medications with patient prescribed and discussed compliance. [name] Collaboration with PCP regarding development and update of comprehensive plan of care as evidenced by  provider attestation and co-signature . Patient was informed that current CCM LCSW will be leaving position next month and her next CCM Social Work follow up visit will be with another LCSW. Patient was appreciative of support provided and receptive to news . Inter-disciplinary care team collaboration (see longitudinal plan of care) . Patient has a long term mental health provider that she sees regular. Patient sees Amy Hansen at [facility] in [place], [place]. Patient is currently on several medications to treat her anxiety/depression which include: Lamictal, Wellbutrin, Zoloft, Risperdal and Xanax.   Patient Goals/Self-Care Activities: Over the next 120 days . - avoid negative self-talk . - develop a personal safety plan . - develop a plan to deal with triggers like holidays, anniversaries . - exercise at least 2 to 3 times per week . - have a plan for how to handle bad days . - journal feelings and what helps to feel better or worse . - spend time or talk with others at least 2 to 3 times per week . - spend time or talk with others every day . - watch for early signs of feeling worse . - write in journal every day         Amy Hansen, [name], MSW, [name] Phone: [phone]

## 2020-11-10 NOTE — Chronic Care Management (AMB) (Signed)
Care Management Clinical Social Work Note  11/10/2020 Name: Amy Hansen MRN: [id number] DOB: November 15, 1955  Amy Hansen is a 65 y.o. year old female who is a primary care patient of [name], NP.  The Care Management team was consulted for assistance with chronic disease management and coordination needs.  Engaged with patient by telephone for follow up visit in response to provider referral for social work chronic care management and care coordination services  Consent to Services:  Ms. Likes was given information about Care Management services today including:  1. Care Management services includes personalized support from designated clinical staff supervised by her physician, including individualized plan of care and coordination with other care providers 2. 24/7 contact phone numbers for assistance for urgent and routine care needs. 3. The patient may stop case management services at any time by phone call to the office staff.  Patient agreed to services and consent obtained.   Assessment: Review of patient past medical history, allergies, medications, and health status, including review of relevant consultants reports was performed today as part of a comprehensive evaluation and provision of chronic care management and care coordination services.  SDOH (Social Determinants of Health) assessments and interventions performed:    Advanced Directives Status: See Care Plan for related entries.  Care Plan  Allergies  Allergen Reactions  . Avelox [Moxifloxacin Hcl In Nacl] Anaphylaxis  . Bactrim [Sulfamethoxazole-Trimethoprim] Anaphylaxis  . Ciprofloxacin Other (See Comments)    Pt states she was told never to take this as it is in the same family as Avelox.   . Depakote [Divalproex Sodium]   . Imitrex [Sumatriptan] Other (See Comments)    Neck and shoulder pain  . Stadol [Butorphanol] Rash    Outpatient Encounter Medications as of 11/10/2020  Medication Sig  .  ALPRAZolam (XANAX) 0.5 MG tablet TAKE 1 TABLET BY MOUTH 4 TIMES DAILY AS NEEDED FOR ANXIETY  . buPROPion (WELLBUTRIN XL) 150 MG 24 hr tablet Take 3 tablets by mouth once daily  . Cholecalciferol (VITAMIN D) 50 MCG (2000 UT) CAPS Take 1 capsule (2,000 Units total) by mouth daily.  [name] estradiol (ESTRACE) 0.1 MG/GM vaginal cream Apply one pea-sized amount around the opening of the urethra three times weekly.  . ferrous sulfate 325 (65 FE) MG EC tablet Take 1 tablet (325 mg total) by mouth 2 (two) times daily.  . furosemide (LASIX) 20 MG tablet Take 1 tablet (20 mg total) by mouth daily.  [name] glipiZIDE (GLUCOTROL) 5 MG tablet TAKE 1 TABLET BY MOUTH TWICE DAILY WITH MEALS (APPOINTMENT  REQUIRED  FOR  FUTURE  REFILLS)  . insulin glargine, 2 Unit Dial, (TOUJEO MAX SOLOSTAR) 300 UNIT/ML Solostar Pen Inject 24 units daily. Titrate as instructed. Max daily dose 40 units  . Insulin Pen Needle (PEN NEEDLES) 32G X 4 MM MISC 1 Units by Does not apply route at bedtime.  . lamoTRIgine (LAMICTAL) 200 MG tablet Take 2 tablets (400 mg total) by mouth at bedtime.  [name] loperamide (IMODIUM) 2 MG capsule Take 1 capsule (2 mg total) by mouth as needed for diarrhea or loose stools.  . mesalamine (LIALDA) 1.2 g EC tablet Take 4.8 g by mouth daily with breakfast.  . metFORMIN (GLUCOPHAGE) 1000 MG tablet TAKE 1 TABLET BY MOUTH TWICE DAILY WITH MEALS (APPOINTMENT  REQUIRED  FOR  FUTURE  REFILLS)  . nystatin (MYCOSTATIN/NYSTOP) powder Apply 1 application topically 3 (three) times daily. Apply to lower abdominal fold tid x 2 to 3 weeks  .  ondansetron (ZOFRAN-ODT) 4 MG disintegrating tablet Take 1 tablet (4 mg total) by mouth every 6 (six) hours as needed for nausea. Place tablet under tongue  . pantoprazole (PROTONIX) 40 MG tablet Take 1 tablet (40 mg total) by mouth daily.  . pramipexole (MIRAPEX) 1 MG tablet Take 1 tablet (1 mg total) by mouth at bedtime.  . predniSONE (DELTASONE) 10 MG tablet Take 4 tablets (40 mg total) by mouth  daily with breakfast for 14 days, THEN 3.5 tablets (35 mg total) daily with breakfast for 7 days, THEN 3 tablets (30 mg total) daily with breakfast for 7 days, THEN 2.5 tablets (25 mg total) daily with breakfast for 7 days, THEN 2 tablets (20 mg total) daily with breakfast for 7 days, THEN 1.5 tablets (15 mg total) daily with breakfast for 7 days, THEN 1 tablet (10 mg total) daily with breakfast for 7 days, THEN 0.5 tablets (5 mg total) daily with breakfast for 7 days.  . risperiDONE (RISPERDAL) 3 MG tablet Take 1 tablet (3 mg total) by mouth at bedtime.  . sertraline (ZOLOFT) 100 MG tablet TAKE 1 & 1/2 (ONE & ONE-HALF) TABLETS BY MOUTH ONCE DAILY  . Turmeric, Curcuma Longa, (CURCUMIN) POWD Take as directed   No facility-administered encounter medications on file as of 11/10/2020.    Patient Active Problem List   Diagnosis Date Noted  . Abnormal CT scan, gastrointestinal tract   . BRBPR (bright red blood per rectum) 05/01/2020  . Weakness 04/25/2020  . Pancolitis ([facility]) 04/25/2020  . Type 2 diabetes mellitus ([facility]) 04/25/2020  . Colitis 04/21/2020  . AKI (acute kidney injury) ([place]) 04/21/2020  . Major depression, chronic 06/01/2018  . OCD (obsessive compulsive disorder) 06/01/2018  . Chronic heart failure with preserved ejection fraction ([facility]) 05/16/2018  . Left leg pain 11/04/2017  . GERD (gastroesophageal reflux disease) 02/18/2017  . Iron deficiency anemia 10/18/2016  . Leg swelling 07/17/2016  . Hypertension 03/21/2016  . Vitamin D deficiency 02/21/2016  . B12 deficiency 02/21/2016  . Poorly controlled type 2 diabetes mellitus ([place]) 09/13/2015  . Migraines 05/23/2015  . Restless legs syndrome (RLS) 05/23/2015  . Recurrent UTI 04/26/2015  . Atrophic vaginitis 04/26/2015  . Dyspareunia, female 04/26/2015    Conditions to be addressed/monitored: Anxiety; Mental Health Concerns   Care Plan : Depression (Adult)  Updates made by Greg Cutter, LCSW since 11/10/2020 12:00 AM     Problem: Response to Treatment (Depression)     Long-Range Goal: Response to Treatment Maximized   Start Date: 11/10/2020  Priority: Medium  Note:   Timeframe:  Long-Range Goal Priority:  Medium Start Date:   11/10/20                       Expected End Date:  02/09/21                     Follow Up Date- 01/09/21  Current barriers:   . Chronic Mental Health needs related to Depression and Anxiety  Needs Support, Education, and Care Coordination in order to meet unmet mental health needs. Leodis Liverpool knowledge of where and how to connect for mental health support resources  Clinical Goal(s): Over the next 120 days, patient will work with SW to reduce or manage symptoms of anxiety and depression and increase knowledge and/or ability of: coping skills, healthy habits, self-management skills, and stress reduction.until connected for ongoing counseling.   Clinical Interventions:  . Assessed patient's previous treatment, needs, coping  skills, current treatment, support system and barriers to care  .  brief mental health assessment;Suicidal Ideation/Homicidal Ideation: No ) . Provided basic mental health support, education  . Provided interventions: Active listening / Reflection utilized , Emotional Supportive Provided, Behavioral Activation, Motivational Interviewing, and Verbalization of feelings encouraged  ; . Patient interviewed and appropriate assessments performed . Assisted patient/caregiver with obtaining information about health plan benefits . Provided education and assistance to client regarding Advanced Directives. . Provided education to patient/caregiver regarding level of care options. . Discussed several options for long term counseling based on need and insurance.  . Reviewed mental health medications with patient prescribed and discussed compliance. [name] Collaboration with PCP regarding development and update of comprehensive plan of care as evidenced by provider attestation and  co-signature . Patient was informed that current CCM LCSW will be leaving position next month and her next CCM Social Work follow up visit will be with another LCSW. Patient was appreciative of support provided and receptive to news . Inter-disciplinary care team collaboration (see longitudinal plan of care) . Patient has a long term mental health provider that she sees regular. Patient sees Donnal Moat at [facility] in [place], [place]. Patient is currently on several medications to treat her anxiety/depression which include: Lamictal, Wellbutrin, Zoloft, Risperdal and Xanax.   Patient Goals/Self-Care Activities: Over the next 120 days . - avoid negative self-talk . - develop a personal safety plan . - develop a plan to deal with triggers like holidays, anniversaries . - exercise at least 2 to 3 times per week . - have a plan for how to handle bad days . - journal feelings and what helps to feel better or worse . - spend time or talk with others at least 2 to 3 times per week . - spend time or talk with others every day . - watch for early signs of feeling worse . - write in journal every day    Task: Facilitate Engagement in [place]   Note:   Care Management Activities:    - attendance at mental health appointments reviewed - barriers to treatment reviewed and addressed - risk of unmanaged depression discussed    Notes:       Follow Up Plan: SW will follow up with patient by phone over the next quarter  Eula Fried, BSW, MSW, [email or web address] Phone: [phone]

## 2020-11-16 ENCOUNTER — Other Ambulatory Visit: Payer: Self-pay | Admitting: Nurse Practitioner

## 2020-11-16 MED ORDER — PRAMIPEXOLE DIHYDROCHLORIDE 1 MG PO TABS: 1.0000 mg | ORAL_TABLET | Freq: Every day | ORAL | 0 refills | Status: DC

## 2020-11-16 NOTE — Telephone Encounter (Signed)
Medication Refill - Medication: pramipexole (MIRAPEX) 1 MG tablet   Has the patient contacted their pharmacy? No. (Agent: If no, request that the patient contact the pharmacy for the refill.) (Agent: If yes, when and what did the pharmacy advise?)  Preferred Pharmacy (with phone number or street name):  [facility] [address] [place] - [place]  [place] [address]  Phone: [phone] Fax: [phone]     Agent: Please be advised that RX refills may take up to 3 business days. We ask that you follow-up with your pharmacy.

## 2020-11-16 NOTE — Telephone Encounter (Signed)
Requested Prescriptions  Pending Prescriptions Disp Refills  . pramipexole (MIRAPEX) 1 MG tablet 90 tablet 0    Sig: Take 1 tablet (1 mg total) by mouth at bedtime.     Neurology:  Parkinsonian Agents Passed - 11/16/2020  4:18 PM      Passed - Last BP in normal range    BP Readings from Last 1 Encounters:  11/07/20 138/81         Passed - Valid encounter within last 12 months    Recent Outpatient Visits          1 week ago UTI (urinary tract infection), bacterial   [facility] [name], NP   2 weeks ago Poorly controlled type 2 diabetes mellitus ([facility])   [facility] [name], NP   5 months ago Poorly controlled type 2 diabetes mellitus ([facility])   [facility] Eulogio Bear, NP   6 months ago Hospital discharge follow-up   [facility] Eulogio Bear, NP   6 months ago Poorly controlled type 2 diabetes mellitus [facility])   [facility] Eulogio Bear, NP      Future Appointments            In 1 week [name], NP [facility], [place]

## 2020-11-16 NOTE — Chronic Care Management (AMB) (Signed)
  Care Management   Note  11/16/2020 Name: Amy Hansen MRN: [id number] DOB: Jan 27, 1956  Amy Hansen is a 65 y.o. year old female who is a primary care patient of [name], NP and is actively engaged with the care management team. I reached out to [name] by phone today to assist with re-scheduling a follow up visit with the RN Case Manager  Follow up plan: Telephone appointment with care management team member scheduled for:12/02/2020  Noreene Larsson, [place], [place], [place] [address] Direct Dial: [phone] [email or web address]: [email or web address]

## 2020-11-28 ENCOUNTER — Ambulatory Visit: Payer: Self-pay | Admitting: Nurse Practitioner

## 2020-11-29 ENCOUNTER — Telehealth: Payer: Self-pay | Admitting: Gastroenterology

## 2020-11-29 DIAGNOSIS — R197 Diarrhea, unspecified: Secondary | ICD-10-CM

## 2020-11-29 DIAGNOSIS — K529 Noninfective gastroenteritis and colitis, unspecified: Secondary | ICD-10-CM

## 2020-11-29 NOTE — Telephone Encounter (Signed)
Spoke with patient, she reports that she has been having a colitis flare since 11/24/20. Patient reports about 5-6 episodes of loose stools/ diarrhea a day. She reports that she noticed BRB in the commode on Saturday. Reports vomiting, and mid to lower left sided abdominal pain. Denies having a fever but has experienced chills. She reports that she is still taking Lialda 4.8 g daily, sje states that she has finished the prednisone taper. Please advise, thanks.

## 2020-11-29 NOTE — Telephone Encounter (Signed)
Inbound call from patient. States she has been having a colitis flare up since 4/28. Asked if there is something that can be called in to help. Best contact number [phone].

## 2020-11-29 NOTE — Telephone Encounter (Signed)
Difficult situation. She warrants biologic therapy but has no insurance. Can you clarify if she has any plans to obtain insurance soon as her medication is not holding her colitis in check.  That being said, I see she received antibiotics recently for a UTI. She should go to the lab for C diff PCR to ensure negative. Otherwise, continue Lialda, if C diff is negative would plan on giving her another prednisone taper, but would like C diff result back first. Can give some Zofran 76m ODT to use every 6 hours PRN. If she can go to the lab as soon as possible that would be helpful.

## 2020-11-29 NOTE — Telephone Encounter (Signed)
Spoke with patient in regards to recommendations. Patient states that she is still trying to obtain insurance at this time but has not had any success. Patient will come by tomorrow to pick up the stool kit and complete the stool sample. Advised patient to continue Lialda, she states that she has some Zofran to take and will take that as well. Advised patient that if stool study is negative then Dr. Havery Moros would like to begin patient on a Prednisone taper. Patient verbalized understanding of all information and had no concerns at the end of the call.   Lab order in epic.

## 2020-11-30 ENCOUNTER — Other Ambulatory Visit: Payer: Self-pay

## 2020-11-30 DIAGNOSIS — K529 Noninfective gastroenteritis and colitis, unspecified: Secondary | ICD-10-CM

## 2020-11-30 DIAGNOSIS — R197 Diarrhea, unspecified: Secondary | ICD-10-CM

## 2020-12-01 LAB — CLOSTRIDIUM DIFFICILE BY PCR: Toxigenic C. Difficile by PCR: NEGATIVE

## 2020-12-02 ENCOUNTER — Telehealth: Payer: Self-pay | Admitting: General Practice

## 2020-12-02 ENCOUNTER — Ambulatory Visit: Payer: Self-pay | Admitting: General Practice

## 2020-12-02 DIAGNOSIS — M7989 Other specified soft tissue disorders: Secondary | ICD-10-CM

## 2020-12-02 DIAGNOSIS — I10 Essential (primary) hypertension: Secondary | ICD-10-CM

## 2020-12-02 DIAGNOSIS — E1165 Type 2 diabetes mellitus with hyperglycemia: Secondary | ICD-10-CM

## 2020-12-02 DIAGNOSIS — K529 Noninfective gastroenteritis and colitis, unspecified: Secondary | ICD-10-CM

## 2020-12-02 MED ORDER — PREDNISONE 10 MG PO TABS: ORAL_TABLET | ORAL | 0 refills | Status: AC

## 2020-12-02 MED ORDER — PREDNISONE 10 MG PO TABS: 10.0000 mg | ORAL_TABLET | Freq: Every day | ORAL | 0 refills | Status: DC

## 2020-12-02 NOTE — Telephone Encounter (Signed)
Inbound call from patient. Wants to know if test results are back. States she can't stop using the restroom, can't eat. Comes in little pieces and lots of mucus. Best contact [phone]

## 2020-12-02 NOTE — Telephone Encounter (Signed)
Amy Flock, MD  12/01/2020 6:31 PM EDT Back to [facility] can you please relay the following: -C. difficile testing is negative -Please give her prednisone 40 mg/day for 2 weeks then taper by 5 mg/week until done for recent flare of symptoms -Please let her know this is a temporary solution I suspect she will continue to flare without more aggressive therapy. She really warrants biologic therapy and needs to get healthcare insurance in order to be on any other medication to treat this. If she can get insurance please have her contact us to discuss other options. Thanks. If for some reason prednisone does not help she needs to let us know as well   Patient notified of the results and recommendations. New rx sent.  She will call back if not improved by early next week.  She will call back if she is able to obtain health insurance.

## 2020-12-02 NOTE — Addendum Note (Signed)
Addended by: Marlon Pel on: 12/02/2020 09:14 AM   Modules accepted: Orders

## 2020-12-02 NOTE — Patient Instructions (Signed)
Visit Information  Patient Care Plan: Depression (Adult)    Problem Identified: Response to Treatment (Depression)     Long-Range Goal: Response to Treatment Maximized   Start Date: 11/10/2020  Priority: Medium  Note:   Timeframe:  Long-Range Goal Priority:  Medium Start Date:   11/10/20                       Expected End Date:  02/09/21                     Follow Up Date- 01/09/21  Current barriers:   . Chronic Mental Health needs related to Depression and Anxiety  Needs Support, Education, and Care Coordination in order to meet unmet mental health needs. Leodis [place] knowledge of where and how to connect for mental health support resources  Clinical Goal(s): Over the next 120 days, patient will work with SW to reduce or manage symptoms of anxiety and depression and increase knowledge and/or ability of: coping skills, healthy habits, self-management skills, and stress reduction.until connected for ongoing counseling.   Clinical Interventions:  . Assessed patient's previous treatment, needs, coping skills, current treatment, support system and barriers to care  .  brief mental health assessment;Suicidal Ideation/Homicidal Ideation: No ) . Provided basic mental health support, education  . Provided interventions: Active listening / Reflection utilized , Emotional Supportive Provided, Behavioral Activation, Motivational Interviewing, and Verbalization of feelings encouraged  ; . Patient interviewed and appropriate assessments performed . Assisted patient/caregiver with obtaining information about health plan benefits . Provided education and assistance to client regarding Advanced Directives. . Provided education to patient/caregiver regarding level of care options. . Discussed several options for long term counseling based on need and insurance.  . Reviewed mental health medications with patient prescribed and discussed compliance. [name] Collaboration with PCP regarding development and update  of comprehensive plan of care as evidenced by provider attestation and co-signature . Patient was informed that current CCM LCSW will be leaving position next month and her next CCM Social Work follow up visit will be with another LCSW. Patient was appreciative of support provided and receptive to news . Inter-disciplinary care team collaboration (see longitudinal plan of care) . Patient has a long term mental health provider that she sees regular. Patient sees Donnal Moat at [facility] in [place], [place]. Patient is currently on several medications to treat her anxiety/depression which include: Lamictal, Wellbutrin, Zoloft, Risperdal and Xanax.   Patient Goals/Self-Care Activities: Over the next 120 days . - avoid negative self-talk . - develop a personal safety plan . - develop a plan to deal with triggers like holidays, anniversaries . - exercise at least 2 to 3 times per week . - have a plan for how to handle bad days . - journal feelings and what helps to feel better or worse . - spend time or talk with others at least 2 to 3 times per week . - spend time or talk with others every day . - watch for early signs of feeling worse . - write in journal every day    Task: Facilitate Engagement in [place]   Note:   Care Management Activities:    - attendance at mental health appointments reviewed - barriers to treatment reviewed and addressed - risk of unmanaged depression discussed    Notes:    Patient Care Plan: RNCM: Diabetes Type 2 (Adult)    Problem Identified: RNCM: Glycemic Management (Diabetes, Type 2)  Priority: Medium    Goal: RNCM: Glycemic Management Optimized   Priority: Medium  Note:   Objective:  . Lab Results .  Component . Value . Date .   [name] HGBA1C . 8.7 (H) . 10/27/2020 .    Lab Results  Component Value Date   CREATININE 0.61 05/26/2020   CREATININE 0.58 05/06/2020   CREATININE 0.51 05/03/2020 .   [name] No results found for: EGFR Current  Barriers:  [name] Knowledge Deficits related to basic Diabetes pathophysiology and self care/management . Knowledge Deficits related to medications used for management of diabetes . Limited Social Support . Unable to independently manage DM . Does not contact provider office for questions/concerns . Frequent exacerbations of colitis with steroid therapy making glucose levels elevated Case Manager Clinical Goal(s):  [name] Collaboration with [name], NP regarding development and update of comprehensive plan of care as evidenced by provider attestation and co-signature . Inter-disciplinary care team collaboration (see longitudinal plan of care) . Over the next 120 days, patient will demonstrate improved adherence to prescribed treatment plan for diabetes self care/management as evidenced by:  . daily monitoring and recording of CBG  . adherence to ADA/ carb modified diet . exercise 2/3 days/week . adherence to prescribed medication regimen Interventions:  . Provided education to patient about basic DM disease process . Reviewed medications with patient and discussed importance of medication adherence. 12-02-2020: The patient is compliant with medications. The patient is currently experiencing and exacerbation in her colitis and is going to start a prednisone dose pack. Review of action plan provided by the pcp on how to take her insulin for a goal of keeping blood sugar <130.  The patient forgot that her blood sugars would elevate with the steroid therapy. Extensive education and support given. Advised the patient to call the office for questions or concerns related to elevated blood sugars.  The patient verbalized understanding.  . Discussed plans with patient for ongoing care management follow up and provided patient with direct contact information for care management team . Provided patient with written educational materials related to hypo and hyperglycemia and importance of correct treatment.  12-02-2020: Education on monitoring for elevated blood sugars especially with the use of prednisone. The patient is aware of sx and sx and has action plan in place. . Reviewed scheduled/upcoming provider appointments including: encouraged the patient to call the office for changes or questions, last saw pcp on 11-04-2020 for UTI that is now resolved.  . Advised patient, providing education and rationale, to check cbg daily  and record, calling pcp for findings outside established parameters.  10-26-2020: The patient told the care guide she had been having elevated blood sugars. Care guide reached out to the [facility] and ask for recommendations. RNCM sent messages by in basket to pcp and staff. The patient has an appointment for evaluation and recommendations on 10-27-2020.  The RNCM called the patient and the patient states she forget that she has been taking prednisone x 2 weeks for a colitis flare up and that is likely why her blood sugars are elevated. Advised the patient to go to the ER for nausea/vomiting, fruity smelling/tasting breath, confusion, elevated blood sugars, and changes in condition. The patient verbalized understanding. The patient advised also to keep her appointment with the pcp on 10-27-2020.  The patient verbalized understanding of recommendations. Will continue to monitor. 12-02-2020: The patient is having a new exacerbation of her colitis and is being followed by her GI provider. Review of blood sugar  readings. The patient states she has not taken her blood sugars in several days as she has not felt well. Encouraged the patient to take blood sugars and write down.  Reminded the patient of blood sugar elevations with taking prednisone and to follow the action plan by the pcp. Review of what worse looks like and to seek emergent help if needed.  . Review of patient status, including review of consultants reports, relevant laboratory and other test results, and medications completed. Patient  Goals/Self-Care Activities . Over the next 120 days, patient will:  - Self administers oral medications as prescribed Self administers insulin as prescribed Attends all scheduled provider appointments Checks blood sugars as prescribed and utilize hyper and hypoglycemia protocol as needed Adheres to prescribed ADA/carb modified - barriers to adherence to treatment plan identified- 12-02-2020: Exacerbations of colitis with prednisone use - blood glucose monitoring encouraged - blood glucose readings reviewed- 12-02-2020: has not taken in several days as she does not feel well. Education on taking readings and writing down the numbers and the importance of glucose testing even when not feeling well - mutual A1C goal set or reviewed. - resources required to improve adherence to care identified - self-awareness of signs/symptoms of hypo or hyperglycemia encouraged - use of blood glucose monitoring log promoted Follow Up Plan: Telephone follow up appointment with care management team member scheduled for: 01-24-2021 at 0900 am   Task: RNCM: Alleviate Barriers to Glycemic Management   Note:   Care Management Activities:    - barriers to adherence to treatment plan identified - blood glucose monitoring encouraged - blood glucose readings reviewed - mutual A1C goal set or reviewed - resources required to improve adherence to care identified - self-awareness of signs/symptoms of hypo or hyperglycemia encouraged - use of blood glucose monitoring log promoted     Patient Care Plan: RNCM: Hypertension (Adult)    Problem Identified: RNCM: Hypertension (Hypertension)   Priority: Medium    Long-Range Goal: RNCM: Hypertension Monitored   Priority: Medium  Note:   Objective:  . Last practice recorded BP readings:  . BP Readings from Last 3 Encounters: .  11/07/20 . 138/81 .  10/27/20 . 125/75 .  10/25/20 . 138/70 .    [name] Most recent eGFR/CrCl: No results found for: EGFR  No components found for:  CRCL Current Barriers:  [name] Knowledge Deficits related to basic understanding of hypertension pathophysiology and self care management . Knowledge Deficits related to understanding of medications prescribed for management of hypertension . Limited Social Support . Unable to self administer medications as prescribed . Lacks social connections . Does not contact provider office for questions/concerns Case Manager Clinical Goal(s):  [name] Over the next 120 days, patient will verbalize understanding of plan for hypertension management . Over the next 120 days, patient will demonstrate improved adherence to prescribed treatment plan for hypertension as evidenced by taking all medications as prescribed, monitoring and recording blood pressure as directed, adhering to low sodium/DASH diet . Over the next 120 days, patient will demonstrate improved health management independence as evidenced by checking blood pressure as directed and notifying PCP if SBP>160 or DBP > 90, taking all medications as prescribe, and adhering to a low sodium diet as discussed. Interventions:  . Collaboration with [name], NP regarding development and update of comprehensive plan of care as evidenced by provider attestation and co-signature . Inter-disciplinary care team collaboration (see longitudinal plan of care) . Evaluation of current treatment plan related to hypertension self management  and patient's adherence to plan as established by provider. 12-02-2020: The patient states she is doing well with managing her blood pressure. Is having swelling in her feet and legs likely due to her chronic heart failure, not worse than usual. Education on elevation of feet and legs when sitting, compliance with heart healthy/ADA diet, medications compliance, use of ted hose or compression socks, and calling provider for changes.  . Provided education to patient re: stroke prevention, s/s of heart attack and stroke, DASH diet,  complications of uncontrolled blood pressure . Reviewed medications with patient and discussed importance of compliance. 12-02-2020: The patient states that she is compliant with medications  . Discussed plans with patient for ongoing care management follow up and provided patient with direct contact information for care management team . Advised patient, providing education and rationale, to monitor blood pressure daily and record, calling PCP for findings outside established parameters.  Patient Goals/Self-Care Activities . Over the next 120 days, patient will:  - Self administers medications as prescribed Attends all scheduled provider appointments Calls provider office for new concerns, questions, or BP outside discussed parameters Checks BP and records as discussed Follows a low sodium diet/DASH diet - blood pressure trends reviewed - depression screen reviewed - home or ambulatory blood pressure monitoring encouraged Follow Up Plan: Telephone follow up appointment with care management team member scheduled for: 01-24-2021 at 0900 am   Task: RNCM: Identify and Monitor Blood Pressure Elevation   Note:   Care Management Activities:    - blood pressure trends reviewed - depression screen reviewed - home or ambulatory blood pressure monitoring encouraged       Patient Care Plan: RNCM: Effective management of Colitis/Chron's    Problem Identified: RNCM: Health Promotion or Disease Self-Management (General Plan of Care) Colitis and Chron's management   Priority: High    Goal: RNCM: Self-Management Plan Developed   Priority: High  Note:   Current Barriers:  [name] Knowledge Deficits related to resources and education on effective management of colitis and chron's  . Chronic Disease Management support and education needs related to colitis and chron's . Lacks caregiver support.  . Unable to independently manage exacerbations of colitis/chron's . Lacks social connections . Does not  contact provider office for questions/concerns  Nurse Case Manager Clinical Goal(s):  [name] Over the next 120 days, patient will verbalize understanding of plan for effective management of chron's and colitis  . Over the next 120 days, patient will work with [place], and pcp to address needs related to Management of chron's and colitis . Over the next 120 days, patient will demonstrate a decrease in colitis/chron's  exacerbations as evidenced by effective management of sx/sx, taking medications as prescribed, managing diet, and keeping appointments  . Over the next 120 days, patient will verbalize basic understanding of colitis/chron's  disease process and self health management plan as evidenced by no acute exacerbations and stabilization of disease process  Interventions:  . 1:1 collaboration with [name], NP regarding development and update of comprehensive plan of care as evidenced by provider attestation and co-signature . Inter-disciplinary care team collaboration (see longitudinal plan of care) . Evaluation of current treatment plan related to effective management of colitis and chron's disease  and patient's adherence to plan as established by provider. 12-02-2020: The patient is currently having an exacerbation of her colitis. Working with GI provider to get steroid dose pack for flare up. States since she was diagnosed in October she has had two bad flare ups.  Reminded the patient of the effects of prednisone on blood sugars and DM health. The patient advised to follow recommendations by pcp and specialist.  . Advised patient to call the provider for changes in condition, worsening sx/sx or exacerbations. 12-02-2020: Reviewed  . Provided education to patient re: dietary support with colitis and chron's. 12-02-2020: Education on staying hydrated and watching dietary restrictions of salt, fats, and sugars . Discussed plans with patient for ongoing care management follow up and provided patient  with direct contact information for care management team  Patient Goals/Self-Care Activities Over the next 120 days, patient will:  - Patient will self administer medications as prescribed Patient will attend all scheduled provider appointments Patient will call pharmacy for medication refills Patient will call provider office for new concerns or questions Patient will work with BSW to address care coordination needs and will continue to work with the clinical team to address health care and disease management related needs.   - barriers to meeting goals identified- current flare up (12-02-2020)  - choices provided - collaboration with team encouraged - decision-making supported - health risks reviewed - problem-solving facilitated - questions answered - readiness for change evaluated - reassurance provided - resources needed to meet goals identified - self-reflection promoted - self-reliance encouraged - verbalization of feelings encouraged  Follow Up Plan: Telephone follow up appointment with care management team member scheduled for: 01-24-2021 at 0900       Task: RNCM: Mutually Develop and Royce Macadamia Achievement of Patient Goals   Note:   Care Management Activities:    - barriers to meeting goals identified - choices provided - collaboration with team encouraged - decision-making supported - health risks reviewed - problem-solving facilitated - questions answered - readiness for change evaluated - reassurance provided - resources needed to meet goals identified - self-reflection promoted - self-reliance encouraged - verbalization of feelings encouraged    Notes: 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   Telephone follow up appointment with care management team member scheduled for: 01-24-2021 at 0900 am  Noreene Larsson RN, MSN, [facility] Mobile: [phone]

## 2020-12-02 NOTE — Telephone Encounter (Signed)
  Chronic Care Management   Note  12/02/2020 Name: LORELI DEBRULER MRN: [id number] DOB: [birth year]  The patient returned call the [facility].  See new encounter for information. Visit completed.   Chronic Care Management   Outreach Note  12/02/2020 Name: ANJANNETTE GAUGER MRN: [id number] DOB: 06-Sep-1955  Referred by: [name], NP Reason for referral : Appointment (RNCM: Follow up for Chronic Disease Management and Care Coordination Needs)  Follow up plan: Telephone follow up appointment with care management team member scheduled for:01-24-2021 at 0900 am  [place], MSN, [facility] Mobile: [phone]

## 2020-12-02 NOTE — Chronic Care Management (AMB) (Signed)
Care Management    RN Visit Note  12/02/2020 Name: Amy Hansen MRN: [id number] DOB: 1956/07/30  Subjective: Amy Hansen is a 65 y.o. year old female who is a primary care patient of [name], NP. The care management team was consulted for assistance with disease management and care coordination needs.    Engaged with patient by telephone for follow up visit in response to provider referral for case management and/or care coordination services.   Consent to Services:   Ms. Amy Hansen was given information about Care Management services today including:  1. Care Management services includes personalized support from designated clinical staff supervised by her physician, including individualized plan of care and coordination with other care providers 2. 24/7 contact phone numbers for assistance for urgent and routine care needs. 3. The patient may stop case management services at any time by phone call to the office staff.  Patient agreed to services and consent obtained.   Assessment: Review of patient past medical history, allergies, medications, health status, including review of consultants reports, laboratory and other test data, was performed as part of comprehensive evaluation and provision of chronic care management services.   SDOH (Social Determinants of Health) assessments and interventions performed:    Care Plan  Allergies  Allergen Reactions  . Avelox [Moxifloxacin Hcl In Nacl] Anaphylaxis  . Bactrim [Sulfamethoxazole-Trimethoprim] Anaphylaxis  . Ciprofloxacin Other (See Comments)    Pt states she was told never to take this as it is in the same family as Avelox.   . Depakote [Divalproex Sodium]   . Imitrex [Sumatriptan] Other (See Comments)    Neck and shoulder pain  . Stadol [Butorphanol] Rash    Outpatient Encounter Medications as of 12/02/2020  Medication Sig  . ALPRAZolam (XANAX) 0.5 MG tablet TAKE 1 TABLET BY MOUTH 4 TIMES DAILY AS NEEDED FOR ANXIETY   . buPROPion (WELLBUTRIN XL) 150 MG 24 hr tablet Take 3 tablets by mouth once daily  . Cholecalciferol (VITAMIN D) 50 MCG (2000 UT) CAPS Take 1 capsule (2,000 Units total) by mouth daily.  [name] estradiol (ESTRACE) 0.1 MG/GM vaginal cream Apply one pea-sized amount around the opening of the urethra three times weekly.  . ferrous sulfate 325 (65 FE) MG EC tablet Take 1 tablet (325 mg total) by mouth 2 (two) times daily.  . furosemide (LASIX) 20 MG tablet Take 1 tablet (20 mg total) by mouth daily.  [name] glipiZIDE (GLUCOTROL) 5 MG tablet TAKE 1 TABLET BY MOUTH TWICE DAILY WITH MEALS (APPOINTMENT  REQUIRED  FOR  FUTURE  REFILLS)  . insulin glargine, 2 Unit Dial, (TOUJEO MAX SOLOSTAR) 300 UNIT/ML Solostar Pen Inject 24 units daily. Titrate as instructed. Max daily dose 40 units  . Insulin Pen Needle (PEN NEEDLES) 32G X 4 MM MISC 1 Units by Does not apply route at bedtime.  . lamoTRIgine (LAMICTAL) 200 MG tablet Take 2 tablets (400 mg total) by mouth at bedtime.  [name] loperamide (IMODIUM) 2 MG capsule Take 1 capsule (2 mg total) by mouth as needed for diarrhea or loose stools.  . mesalamine (LIALDA) 1.2 g EC tablet Take 4.8 g by mouth daily with breakfast.  . metFORMIN (GLUCOPHAGE) 1000 MG tablet TAKE 1 TABLET BY MOUTH TWICE DAILY WITH MEALS (APPOINTMENT  REQUIRED  FOR  FUTURE  REFILLS)  . nystatin (MYCOSTATIN/NYSTOP) powder Apply 1 application topically 3 (three) times daily. Apply to lower abdominal fold tid x 2 to 3 weeks  . ondansetron (ZOFRAN-ODT) 4 MG disintegrating tablet Take  1 tablet (4 mg total) by mouth every 6 (six) hours as needed for nausea. Place tablet under tongue  . pantoprazole (PROTONIX) 40 MG tablet Take 1 tablet (40 mg total) by mouth daily.  . pramipexole (MIRAPEX) 1 MG tablet Take 1 tablet (1 mg total) by mouth at bedtime.  . predniSONE (DELTASONE) 10 MG tablet Take 1 tablet (10 mg total) by mouth daily with breakfast.  . predniSONE (DELTASONE) 10 MG tablet Take 4 tablets (40 mg total) by  mouth daily with breakfast for 14 days, THEN 3.5 tablets (35 mg total) daily with breakfast for 7 days, THEN 3 tablets (30 mg total) daily with breakfast for 7 days, THEN 2.5 tablets (25 mg total) daily with breakfast for 7 days, THEN 2 tablets (20 mg total) daily with breakfast for 7 days, THEN 1.5 tablets (15 mg total) daily with breakfast for 7 days, THEN 1 tablet (10 mg total) daily with breakfast for 7 days, THEN 0.5 tablets (5 mg total) daily with breakfast for 7 days.  . risperiDONE (RISPERDAL) 3 MG tablet Take 1 tablet (3 mg total) by mouth at bedtime.  . sertraline (ZOLOFT) 100 MG tablet TAKE 1 & 1/2 (ONE & ONE-HALF) TABLETS BY MOUTH ONCE DAILY  . Turmeric, Curcuma [place]a, (CURCUMIN) POWD Take as directed   No facility-administered encounter medications on file as of 12/02/2020.    Patient Active Problem List   Diagnosis Date Noted  . Abnormal CT scan, gastrointestinal tract   . BRBPR (bright red blood per rectum) 05/01/2020  . Weakness 04/25/2020  . Pancolitis ([place]) 04/25/2020  . Type 2 diabetes mellitus ([place]) 04/25/2020  . Colitis 04/21/2020  . AKI (acute kidney injury) ([place]) 04/21/2020  . Major depression, chronic 06/01/2018  . OCD (obsessive compulsive disorder) 06/01/2018  . Chronic heart failure with preserved ejection fraction ([place]) 05/16/2018  . Left leg pain 11/04/2017  . GERD (gastroesophageal reflux disease) 02/18/2017  . Iron deficiency anemia 10/18/2016  . Leg swelling 07/17/2016  . Hypertension 03/21/2016  . Vitamin D deficiency 02/21/2016  . B12 deficiency 02/21/2016  . Poorly controlled type 2 diabetes mellitus ([place]) 09/13/2015  . Migraines 05/23/2015  . Restless legs syndrome (RLS) 05/23/2015  . Recurrent UTI 04/26/2015  . Atrophic vaginitis 04/26/2015  . Dyspareunia, female 04/26/2015    Conditions to be addressed/monitored: HTN, DMII and Colitis   Care Plan : RNCM: Diabetes Type 2 (Adult)  Updates made by Vanita Ingles since 12/02/2020 12:00 AM     Problem: RNCM: Glycemic Management (Diabetes, Type 2)   Priority: Medium    Goal: RNCM: Glycemic Management Optimized   Priority: Medium  Note:   Objective:  . Lab Results .  Component . Value . Date .   [name] HGBA1C . 8.7 (H) . 10/27/2020 .    Lab Results  Component Value Date   CREATININE 0.61 05/26/2020   CREATININE 0.58 05/06/2020   CREATININE 0.51 05/03/2020 .   [name] No results found for: EGFR Current Barriers:  [name] Knowledge Deficits related to basic Diabetes pathophysiology and self care/management . Knowledge Deficits related to medications used for management of diabetes . Limited Social Support . Unable to independently manage DM . Does not contact provider office for questions/concerns . Frequent exacerbations of colitis with steroid therapy making glucose levels elevated Case Manager Clinical Goal(s):  [name] Collaboration with [name], NP regarding development and update of comprehensive plan of care as evidenced by provider attestation and co-signature . Inter-disciplinary care team collaboration (see longitudinal plan of  care) . Over the next 120 days, patient will demonstrate improved adherence to prescribed treatment plan for diabetes self care/management as evidenced by:  . daily monitoring and recording of CBG  . adherence to ADA/ carb modified diet . exercise 2/3 days/week . adherence to prescribed medication regimen Interventions:  . Provided education to patient about basic DM disease process . Reviewed medications with patient and discussed importance of medication adherence. 12-02-2020: The patient is compliant with medications. The patient is currently experiencing and exacerbation in her colitis and is going to start a prednisone dose pack. Review of action plan provided by the pcp on how to take her insulin for a goal of keeping blood sugar <130.  The patient forgot that her blood sugars would elevate with the steroid therapy. Extensive education and support  given. Advised the patient to call the office for questions or concerns related to elevated blood sugars.  The patient verbalized understanding.  . Discussed plans with patient for ongoing care management follow up and provided patient with direct contact information for care management team . Provided patient with written educational materials related to hypo and hyperglycemia and importance of correct treatment. 12-02-2020: Education on monitoring for elevated blood sugars especially with the use of prednisone. The patient is aware of sx and sx and has action plan in place. . Reviewed scheduled/upcoming provider appointments including: encouraged the patient to call the office for changes or questions, last saw pcp on 11-04-2020 for UTI that is now resolved.  . Advised patient, providing education and rationale, to check cbg daily  and record, calling pcp for findings outside established parameters.  10-26-2020: The patient told the care guide she had been having elevated blood sugars. Care guide reached out to the [facility] and ask for recommendations. RNCM sent messages by in basket to pcp and staff. The patient has an appointment for evaluation and recommendations on 10-27-2020.  The RNCM called the patient and the patient states she forget that she has been taking prednisone x 2 weeks for a colitis flare up and that is likely why her blood sugars are elevated. Advised the patient to go to the ER for nausea/vomiting, fruity smelling/tasting breath, confusion, elevated blood sugars, and changes in condition. The patient verbalized understanding. The patient advised also to keep her appointment with the pcp on 10-27-2020.  The patient verbalized understanding of recommendations. Will continue to monitor. 12-02-2020: The patient is having a new exacerbation of her colitis and is being followed by her GI provider. Review of blood sugar readings. The patient states she has not taken her blood sugars in several days as she has  not felt well. Encouraged the patient to take blood sugars and write down.  Reminded the patient of blood sugar elevations with taking prednisone and to follow the action plan by the pcp. Review of what worse looks like and to seek emergent help if needed.  . Review of patient status, including review of consultants reports, relevant laboratory and other test results, and medications completed. Patient Goals/Self-Care Activities . Over the next 120 days, patient will:  - Self administers oral medications as prescribed Self administers insulin as prescribed Attends all scheduled provider appointments Checks blood sugars as prescribed and utilize hyper and hypoglycemia protocol as needed Adheres to prescribed ADA/carb modified - barriers to adherence to treatment plan identified- 12-02-2020: Exacerbations of colitis with prednisone use - blood glucose monitoring encouraged - blood glucose readings reviewed- 12-02-2020: has not taken in several days as she does  not feel well. Education on taking readings and writing down the numbers and the importance of glucose testing even when not feeling well - mutual A1C goal set or reviewed. - resources required to improve adherence to care identified - self-awareness of signs/symptoms of hypo or hyperglycemia encouraged - use of blood glucose monitoring log promoted Follow Up Plan: Telephone follow up appointment with care management team member scheduled for: 01-24-2021 at 0900 am   Care Plan : RNCM: Hypertension (Adult)  Updates made by Vanita Ingles since 12/02/2020 12:00 AM    Problem: RNCM: Hypertension (Hypertension)   Priority: Medium    Long-Range Goal: RNCM: Hypertension Monitored   Priority: Medium  Note:   Objective:  . Last practice recorded BP readings:  . BP Readings from Last 3 Encounters: .  11/07/20 . 138/81 .  10/27/20 . 125/75 .  10/25/20 . 138/70 .    [name] Most recent eGFR/CrCl: No results found for: EGFR  No components found for:  CRCL Current Barriers:  [name] Knowledge Deficits related to basic understanding of hypertension pathophysiology and self care management . Knowledge Deficits related to understanding of medications prescribed for management of hypertension . Limited Social Support . Unable to self administer medications as prescribed . Lacks social connections . Does not contact provider office for questions/concerns Case Manager Clinical Goal(s):  [name] Over the next 120 days, patient will verbalize understanding of plan for hypertension management . Over the next 120 days, patient will demonstrate improved adherence to prescribed treatment plan for hypertension as evidenced by taking all medications as prescribed, monitoring and recording blood pressure as directed, adhering to low sodium/DASH diet . Over the next 120 days, patient will demonstrate improved health management independence as evidenced by checking blood pressure as directed and notifying PCP if SBP>160 or DBP > 90, taking all medications as prescribe, and adhering to a low sodium diet as discussed. Interventions:  . Collaboration with [name], NP regarding development and update of comprehensive plan of care as evidenced by provider attestation and co-signature . Inter-disciplinary care team collaboration (see longitudinal plan of care) . Evaluation of current treatment plan related to hypertension self management and patient's adherence to plan as established by provider. 12-02-2020: The patient states she is doing well with managing her blood pressure. Is having swelling in her feet and legs likely due to her chronic heart failure, not worse than usual. Education on elevation of feet and legs when sitting, compliance with heart healthy/ADA diet, medications compliance, use of ted hose or compression socks, and calling provider for changes.  . Provided education to patient re: stroke prevention, s/s of heart attack and stroke, DASH diet,  complications of uncontrolled blood pressure . Reviewed medications with patient and discussed importance of compliance. 12-02-2020: The patient states that she is compliant with medications  . Discussed plans with patient for ongoing care management follow up and provided patient with direct contact information for care management team . Advised patient, providing education and rationale, to monitor blood pressure daily and record, calling PCP for findings outside established parameters.  Patient Goals/Self-Care Activities . Over the next 120 days, patient will:  - Self administers medications as prescribed Attends all scheduled provider appointments Calls provider office for new concerns, questions, or BP outside discussed parameters Checks BP and records as discussed Follows a low sodium diet/DASH diet - blood pressure trends reviewed - depression screen reviewed - home or ambulatory blood pressure monitoring encouraged Follow Up Plan: Telephone follow up appointment with  care management team member scheduled for: 01-24-2021 at 0900 am   Care Plan : RNCM: Effective management of Colitis/Chron's  Updates made by Vanita Ingles since 12/02/2020 12:00 AM    Problem: RNCM: Health Promotion or Disease Self-Management (General Plan of Care) Colitis and Chron's management   Priority: High    Goal: RNCM: Self-Management Plan Developed   Priority: High  Note:   Current Barriers:  [name] Knowledge Deficits related to resources and education on effective management of colitis and chron's  . Chronic Disease Management support and education needs related to colitis and chron's . Lacks caregiver support.  . Unable to independently manage exacerbations of colitis/chron's . Lacks social connections . Does not contact provider office for questions/concerns  Nurse Case Manager Clinical Goal(s):  [name] Over the next 120 days, patient will verbalize understanding of plan for effective management of chron's and  colitis  . Over the next 120 days, patient will work with Fox, and pcp to address needs related to Management of chron's and colitis . Over the next 120 days, patient will demonstrate a decrease in colitis/chron's  exacerbations as evidenced by effective management of sx/sx, taking medications as prescribed, managing diet, and keeping appointments  . Over the next 120 days, patient will verbalize basic understanding of colitis/chron's  disease process and self health management plan as evidenced by no acute exacerbations and stabilization of disease process  Interventions:  . 1:1 collaboration with [name], NP regarding development and update of comprehensive plan of care as evidenced by provider attestation and co-signature . Inter-disciplinary care team collaboration (see longitudinal plan of care) . Evaluation of current treatment plan related to effective management of colitis and chron's disease  and patient's adherence to plan as established by provider. 12-02-2020: The patient is currently having an exacerbation of her colitis. Working with GI provider to get steroid dose pack for flare up. States since she was diagnosed in October she has had two bad flare ups. Reminded the patient of the effects of prednisone on blood sugars and DM health. The patient advised to follow recommendations by pcp and specialist.  . Advised patient to call the provider for changes in condition, worsening sx/sx or exacerbations. 12-02-2020: Reviewed  . Provided education to patient re: dietary support with colitis and chron's. 12-02-2020: Education on staying hydrated and watching dietary restrictions of salt, fats, and sugars . Discussed plans with patient for ongoing care management follow up and provided patient with direct contact information for care management team  Patient Goals/Self-Care Activities Over the next 120 days, patient will:  - Patient will self administer medications as prescribed Patient  will attend all scheduled provider appointments Patient will call pharmacy for medication refills Patient will call provider office for new concerns or questions Patient will work with BSW to address care coordination needs and will continue to work with the clinical team to address health care and disease management related needs.   - barriers to meeting goals identified- current flare up (12-02-2020)  - choices provided - collaboration with team encouraged - decision-making supported - health risks reviewed - problem-solving facilitated - questions answered - readiness for change evaluated - reassurance provided - resources needed to meet goals identified - self-reflection promoted - self-reliance encouraged - verbalization of feelings encouraged  Follow Up Plan: Telephone follow up appointment with care management team member scheduled for: 01-24-2021 at 0900         Plan: Telephone follow up appointment with care management team member scheduled  for:  01-24-2021 at 0900 am  Noreene Larsson RN, MSN, [facility] Mobile: [phone]

## 2020-12-13 ENCOUNTER — Telehealth: Payer: Self-pay | Admitting: Internal Medicine

## 2020-12-13 MED ORDER — FUROSEMIDE 20 MG PO TABS: 20.0000 mg | ORAL_TABLET | Freq: Every day | ORAL | 0 refills | Status: DC

## 2020-12-13 NOTE — Telephone Encounter (Signed)
Requested Prescriptions   Signed Prescriptions Disp Refills   furosemide (LASIX) 20 MG tablet 30 tablet 0    Sig: Take 1 tablet (20 mg total) by mouth daily. Please keep upcoming appointment for further refills. Thank you!    Authorizing Provider: END, CHRISTOPHER    Ordering User: Raelene Bott, Elois Averitt L

## 2020-12-13 NOTE — Telephone Encounter (Signed)
*  STAT* If patient is at the pharmacy, call can be transferred to refill team.   1. Which medications need to be refilled? (please list name of each medication and dose if known) lasix  2. Which pharmacy/location (including street and city if local pharmacy) is medication to be sent to? [facility] [address]  3. Do they need a 30 day or 90 day supply? 90  scheduled 5/31

## 2020-12-27 ENCOUNTER — Encounter: Payer: Self-pay | Admitting: Family

## 2020-12-27 ENCOUNTER — Other Ambulatory Visit: Payer: Self-pay

## 2020-12-27 ENCOUNTER — Ambulatory Visit (INDEPENDENT_AMBULATORY_CARE_PROVIDER_SITE_OTHER): Payer: Self-pay | Admitting: Family

## 2020-12-27 VITALS — BP 110/74 | HR 95 | Ht 61.0 in | Wt 131.0 lb

## 2020-12-27 DIAGNOSIS — R600 Localized edema: Secondary | ICD-10-CM

## 2020-12-27 DIAGNOSIS — I5032 Chronic diastolic (congestive) heart failure: Secondary | ICD-10-CM

## 2020-12-27 MED ORDER — FUROSEMIDE 20 MG PO TABS: ORAL_TABLET | ORAL | 0 refills | Status: DC

## 2020-12-27 MED ORDER — FUROSEMIDE 20 MG PO TABS: ORAL_TABLET | ORAL | 3 refills | Status: DC

## 2020-12-27 MED ORDER — POTASSIUM CHLORIDE ER 10 MEQ PO TBCR: 10.0000 meq | EXTENDED_RELEASE_TABLET | Freq: Every day | ORAL | 0 refills | Status: DC

## 2020-12-27 NOTE — Patient Instructions (Addendum)
Medication Instructions:  Your physician has recommended you make the following change in your medication:   CHANGE Furosemide to 64m daily for 2 weeks  START Potassium 192m daily  After 2 weeks,  CHANGE Furosemide to 2070maily with additional 45m40m needed for edema or fluid retention  STOP Potassium  *If you need a refill on your cardiac medications before your next appointment, please call your pharmacy*  Lab Work: Your physician recommends that you return for lab work today: BNP, CMP, CBC  If you have labs (blood work) drawn today and your tests are completely normal, you will receive your results only by: . MyMarland Kitchenhart Message (if you have MyChart) OR . A paper copy in the mail If you have any lab test that is abnormal or we need to change your treatment, we will call you to review the results.  Testing/Procedures:  Your physician has requested that you have an echocardiogram. Echocardiography is a painless test that uses sound waves to create images of your heart. It provides your doctor with information about the size and shape of your heart and how well your heart's chambers and valves are working. This procedure takes approximately one hour. There are no restrictions for this procedure.   Your EKG today showed normal sinus rhythm.    Follow-Up: [facility] and your health needs are our priority.  As part of our continuing mission to provide you with exceptional heart care, we have created designated Provider Care Teams.  These Care Teams include your primary Cardiologist (physician) and Advanced Practice Providers (APPs -  Physician Assistants and Nurse Practitioners) who all work together to provide you with the care you need, when you need it.  We recommend signing up for the patient portal called "MyChart".  Sign up information is provided on this After Visit Summary.  MyChart is used to connect with patients for Virtual Visits (Telemedicine).  Patients are  able to view lab/test results, encounter notes, upcoming appointments, etc.  Non-urgent messages can be sent to your provider as well.   To learn more about what you can do with MyChart, go to [email or web address] Your next appointment:   2 month(s)  The format for your next appointment:   In Person  Provider:   You may see ChriNelva Bush or one of the following Advanced Practice Providers on your designated Care Team:    ChriMurray Hodgkins  RyanChristell Faith-C  JacqMarrianne Mood-C  Cadence FurtKathlen Mody-CVermontitLaurann [place]  Other Instructions  To prevent or reduce lower extremity swelling: . Eat a low salt diet. Salt makes the body hold onto extra fluid which causes swelling. . Sit with legs elevated. For example, in the recliner or on an [place] Wear knee-high compression stockings during the daytime. Ones labeled 15-20 mmHg provide good compression.  Exercise recommendations: The American Heart Association recommends 150 minutes of moderate intensity exercise weekly. Try 30 minutes of moderate intensity exercise 4-5 times per week. This could include walking, jogging, or swimming.  Heart Healthy Diet Recommendations: A low-salt diet is recommended. Meats should be grilled, baked, or boiled. Avoid fried foods. Focus on lean protein sources like fish or chicken with vegetables and fruits. The American Heart Association is a GREAMicrobiologistmerican Heart Association Diet and Lifeystyle Recommendations

## 2020-12-27 NOTE — Progress Notes (Signed)
Office Visit    Patient Name: Amy Hansen Date of Encounter: 12/27/2020  PCP:  [name], NP   [facility]  Cardiologist:  Nelva Bush, MD  Advanced Practice Provider:  No care team member to display Electrophysiologist:  None   Chief Complaint    Amy Hansen is a 65 y.o. female with a hx of chronic HFpEF, diabetes mellitus, anemia, anxiety, depression, restless leg syndrome, atypical chest pain, diverticulitis, GERD, hiatal hernia presents today for follow-up of chronic HFpEF  Past Medical History    Past Medical History:  Diagnosis Date  . Anemia   . Anxiety   . Chronic kidney disease    UTI, hematuria in urine  . Colitis   . Crohn's disease ([place])   . Depression   . Diabetes ([place])   . Diverticulosis   . Frequent headaches   . Interstitial cystitis   . Recurrent UTI   . Restless leg syndrome   . Urinary frequency    Past Surgical History:  Procedure Laterality Date  . ABDOMINAL HYSTERECTOMY    . bariatric bypass  2012  . BIOPSY  05/03/2020   Procedure: BIOPSY;  Surgeon: Milus Banister, MD;  Location: [facility] ENDOSCOPY;  Service: Endoscopy;;  . CARPAL TUNNEL RELEASE Right 2003  . CARPAL TUNNEL RELEASE Right    2008  . CHOLECYSTECTOMY  1975  . COLONOSCOPY WITH PROPOFOL N/A 05/03/2020   Procedure: COLONOSCOPY WITH PROPOFOL;  Surgeon: Milus Banister, MD;  Location: [facility] ENDOSCOPY;  Service: Endoscopy;  Laterality: N/A;  . CYSTOSCOPY W/ RETROGRADES Bilateral 06/06/2015   Procedure: CYSTOSCOPY WITH RETROGRADE PYELOGRAM;  Surgeon: Festus Aloe, MD;  Location: [facility] ORS;  Service: Urology;  Laterality: Bilateral;  . FL INJ LEFT KNEE CT ARTHROGRAM ([facility] HX) Left    1995  . GASTRIC BYPASS  2010  . [place]  2013  . KNEE ARTHROSCOPY Left 1996  . TONSILLECTOMY      Allergies  Allergies  Allergen Reactions  . Avelox [Moxifloxacin Hcl In Nacl] Anaphylaxis  . Bactrim [Sulfamethoxazole-Trimethoprim] Anaphylaxis  .  Ciprofloxacin Other (See Comments)    Pt states she was told never to take this as it is in the same family as Avelox.   . Depakote [Divalproex Sodium]   . Imitrex [Sumatriptan] Other (See Comments)    Neck and shoulder pain  . Stadol [Butorphanol] Rash    History of Present Illness    NIKOL Hansen is a 65 y.o. female with a hx of chronic HFpEF, diabetes mellitus, anemia, anxiety, depression, restless leg syndrome, atypical chest pain, diverticulitis, GERD, hiatal hernia  last seen 08/10/2019 by Dr. Saunders Revel.  Previous echocardiogram January 2018 LVEF 55 to 60%, no R WMA, grade 1 diastolic dysfunction, mild to mildly dilated.  Stress test February 2018 was low risk with no evidence of ischemia.  She was previously evaluated for atypical chest pain which resolved after the addition of omeprazole for treatment of possible GERD in the setting of hiatal hernia.  She was last seen January 2021 at which time she noted 42-monthhistory of worsening lower extremity edema which she attributed to elevated sodium intake.  This resolved after he was reducing salt consumption.  She also noted intermittent palpitations.  She was recommended for 14-day event monitor.  She also noted leg pain and given recent edema venous duplex was recommended.  This was negative for DVT.  Her monitor showed predominantly normal sinus rhythm, average rate 101 bpm, rare PAC/PVC, single  episode of nonsustained VT lasting 9 beats, 20 episodes of SVT up to 11 beats with max rate of 176 bpm.  She was recommended for metoprolol succinate 12.5 mg daily.  She presents today for follow up with chief complaint of lower extremity edema. Of note, on 12/02/20 she was started on Prednisone 36m per day for 2 weeks then taper 511mweek for colitis flare.  She shares with me that she has been on and off prednisone since November.  Her lower extremity edema has been ongoing for 3 weeks and is worse at the end of the day.  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  Does sit most of the day with feet down.  She is wearing compression stockings with some improvement.  Endorses following a high salt diet.  Reports no shortness of breath, orthopnea, PND.  No chest pain, pressure, tightness.  EKGs/Labs/Other Studies Reviewed:   The following studies were reviewed today:  ZIO 07/2019  The patient was monitored for 12 days, 14 hours.  The predominant rhythm was sinus with an average rate of 101 bpm (range 66 to 150 bpm in sinus).  Rare PACs and PVCs were noted.  A single episode of nonsustained ventricular tachycardia lasting nine beats occurred, with a maximum rate of 152 bpm.  There were 20 episodes of supraventricular tachycardia lasting up to 11 beats with a maximal rate of 176 bpm.  No sustained arrhythmia or prolonged pause was observed.  Patient triggered events correspond to sinus rhythm with PACs.   Predominantly sinus rhythm with rare PACs and PVCs as well as brief PSVT and NSVT.  Patient triggered events correspond to sinus rhythm with PACs  Echocardiogram 07/2016 Left ventricle: The cavity size was normal. Wall thickness was    normal. Systolic function was normal. The estimated ejection    fraction was in the range of 55% to 60%. Wall motion was normal;    there were no regional wall motion abnormalities. Doppler    parameters are consistent with abnormal left ventricular    relaxation (grade 1 diastolic dysfunction).  - Mitral valve: There was mild regurgitation.  - Left atrium: The atrium was mildly dilated.   EKG:  EKG is  ordered today.  The ekg ordered today demonstrates NSR 95 bpm with possible LA enlargement and LVH. No acute ST/T wave changes.   Recent Labs: 04/24/2020: Magnesium 1.7 05/03/2020: TSH 0.996 05/26/2020: BNP 55.0 08/23/2020: ALT 21; Hemoglobin 12.2; Platelets 357.0 10/27/2020: BUN 13; Creatinine, Ser 0.71; Potassium 3.9; Sodium 136  Recent Lipid Panel     Component Value Date/Time   CHOL 58 04/26/2020 0339   CHOL 139 07/17/2016 1408   CHOL 148 01/10/2012 0717   TRIG 63 04/26/2020 0339   TRIG 99 07/17/2016 1408   TRIG 110 01/10/2012 0717   HDL 29 (L) 04/26/2020 0339   HDL 47 01/10/2012 0717   CHOLHDL 2.0 04/26/2020 0339   VLDL 13 04/26/2020 0339   VLDL 20 07/17/2016 1408   VLDL 22 01/10/2012 0717   LDLCALC 16 04/26/2020 0339   LDLCALC 79 01/10/2012 0717    Home Medications   Current Meds  Medication Sig  . ALPRAZolam (XANAX) 0.5 MG tablet TAKE 1 TABLET BY MOUTH 4 TIMES DAILY AS NEEDED FOR ANXIETY  . buPROPion (WELLBUTRIN XL) 150 MG 24 hr tablet Take 3 tablets by mouth once daily  . Cholecalciferol (VITAMIN D) 50 MCG (2000 UT) CAPS Take 1 capsule (2,000 Units total) by  mouth daily.  [name] estradiol (ESTRACE) 0.1 MG/GM vaginal cream Apply one pea-sized amount around the opening of the urethra three times weekly.  . ferrous sulfate 325 (65 FE) MG EC tablet Take 1 tablet (325 mg total) by mouth 2 (two) times daily.  . furosemide (LASIX) 20 MG tablet Take 1 tablet (65m) daily. Take an additional tablet (24m AS NEEDED.  . Marland KitchenlipiZIDE (GLUCOTROL) 5 MG tablet TAKE 1 TABLET BY MOUTH TWICE DAILY WITH MEALS (APPOINTMENT  REQUIRED  FOR  FUTURE  REFILLS)  . insulin glargine, 2 Unit Dial, (TOUJEO MAX SOLOSTAR) 300 UNIT/ML Solostar Pen Inject 24 units daily. Titrate as instructed. Max daily dose 40 units  . Insulin Pen Needle (PEN NEEDLES) 32G X 4 MM MISC 1 Units by Does not apply route at bedtime.  . lamoTRIgine (LAMICTAL) 200 MG tablet Take 2 tablets (400 mg total) by mouth at bedtime.  . Marland Kitchenoperamide (IMODIUM) 2 MG capsule Take 1 capsule (2 mg total) by mouth as needed for diarrhea or loose stools.  . mesalamine (LIALDA) 1.2 g EC tablet Take 4.8 g by mouth daily with breakfast.  . metFORMIN (GLUCOPHAGE) 1000 MG tablet TAKE 1 TABLET BY MOUTH TWICE DAILY WITH MEALS (APPOINTMENT  REQUIRED  FOR  FUTURE  REFILLS)  . nystatin (MYCOSTATIN/NYSTOP) powder  Apply 1 application topically 3 (three) times daily. Apply to lower abdominal fold tid x 2 to 3 weeks  . ondansetron (ZOFRAN-ODT) 4 MG disintegrating tablet Take 1 tablet (4 mg total) by mouth every 6 (six) hours as needed for nausea. Place tablet under tongue  . pantoprazole (PROTONIX) 40 MG tablet Take 1 tablet (40 mg total) by mouth daily.  . potassium chloride (KLOR-CON) 10 MEQ tablet Take 1 tablet (10 mEq total) by mouth daily for 14 days.  . pramipexole (MIRAPEX) 1 MG tablet Take 1 tablet (1 mg total) by mouth at bedtime.  . predniSONE (DELTASONE) 10 MG tablet Take 4 tablets (40 mg total) by mouth daily with breakfast for 14 days, THEN 3.5 tablets (35 mg total) daily with breakfast for 7 days, THEN 3 tablets (30 mg total) daily with breakfast for 7 days, THEN 2.5 tablets (25 mg total) daily with breakfast for 7 days, THEN 2 tablets (20 mg total) daily with breakfast for 7 days, THEN 1.5 tablets (15 mg total) daily with breakfast for 7 days, THEN 1 tablet (10 mg total) daily with breakfast for 7 days, THEN 0.5 tablets (5 mg total) daily with breakfast for 7 days.  . risperiDONE (RISPERDAL) 3 MG tablet Take 1 tablet (3 mg total) by mouth at bedtime.  . sertraline (ZOLOFT) 100 MG tablet TAKE 1 & 1/2 (ONE & ONE-HALF) TABLETS BY MOUTH ONCE DAILY  . Turmeric, Curcuma Longa, (CURCUMIN) POWD Take as directed  . [DISCONTINUED] furosemide (LASIX) 20 MG tablet Take 2 tablets (4038mdaily x 2 weeks, then take 1 tablet (29m17maily. Take an additional 29mg67mlet AS NEEDED.     Review of Systems  All other systems reviewed and are otherwise negative except as noted above.  Physical Exam    VS:  BP 110/74 (BP Location: Left Arm, Patient Position: Sitting, Cuff Size: Normal)   Pulse 95   Ht 5' 1"  (1.549 m)   Wt 131 lb (59.4 kg)   LMP  (LMP Unknown)   SpO2 98%   BMI 24.75 kg/m  , BMI Body mass index is 24.75 kg/m.  Wt Readings from Last 3 Encounters:  12/27/20 131 lb (59.4 kg)  11/07/20 133  lb  (60.3 kg)  10/27/20 130 lb (59 kg)    GEN: Well nourished, well developed, in no acute distress. HEENT: normal. Neck: Supple, no JVD, carotid bruits, or masses. Cardiac: RRR, no murmurs, rubs, or gallops. No clubbing, cyanosis. Bilateral 2+ pitting pedal edema..  Radials/DP/PT 2+ and equal bilaterally.  Respiratory:  Respirations regular and unlabored, clear to auscultation bilaterally. GI: Soft, nontender, nondistended. MS: No deformity or atrophy. Skin: Warm and dry, no rash. Neuro:  Strength and sensation are intact. Psych: Normal affect.  Assessment & Plan    1. HFpEF / LE edema - Weight stable. Notes 3-week history of worsening lower extremity edema likely due to high-dose prednisone taper, sitting with legs in dependent position, high sodium diet.  Recommend compression stockings, low-salt diet, elevation. Change furosemide to 40 mg daily for 2 weeks while she is on higher doses of prednisone.  She will take potassium 10 mEq daily. After 2 weeks, reduce to Lasix 47m QD with additional 237mPRN for edema, weight gain and stop potassium. BNP, CBC, CMP today. Update echocardiogram   2. Colitis -presently on prednisone taper as prescribed by GI.  Likely contributory to lower extremity edema.  3. Palpitations- no recurrence.  No indication for AV nodal blocking therapy at this time.  If symptoms return could consider addition of low-dose Toprol.  4. DM2 - Continue to follow with PCP.  5. GERD- Symptoms well controlled on Protonix 40 mg daily.  Continue to follow with GI.   Disposition: Follow up in 2 month(s) with Dr. EnSaunders Revelr APP  Signed, CaLoel DubonnetNP 12/27/2020, 10:07 AM [place]

## 2020-12-28 ENCOUNTER — Telehealth: Payer: Self-pay

## 2020-12-28 LAB — CBC
Hematocrit: 36.1 % (ref 34.0–46.6)
Hemoglobin: 11.5 g/dL (ref 11.1–15.9)
MCH: 28.3 pg (ref 26.6–33.0)
MCHC: 31.9 g/dL (ref 31.5–35.7)
MCV: 89 fL (ref 79–97)
Platelets: 475 10*3/uL — ABNORMAL HIGH (ref 150–450)
RBC: 4.06 x10E6/uL (ref 3.77–5.28)
RDW: 13.4 % (ref 11.7–15.4)
WBC: 9.1 10*3/uL (ref 3.4–10.8)

## 2020-12-28 LAB — COMPREHENSIVE METABOLIC PANEL
ALT: 16 IU/L (ref 0–32)
AST: 20 IU/L (ref 0–40)
Albumin/Globulin Ratio: 1.2 (ref 1.2–2.2)
Albumin: 3.2 g/dL — ABNORMAL LOW (ref 3.8–4.8)
Alkaline Phosphatase: 139 IU/L — ABNORMAL HIGH (ref 44–121)
BUN/Creatinine Ratio: 12 (ref 12–28)
BUN: 9 mg/dL (ref 8–27)
Bilirubin Total: <0.2 mg/dL (ref 0.0–1.2)
CO2: 19 mmol/L — ABNORMAL LOW (ref 20–29)
Calcium: 8.3 mg/dL — ABNORMAL LOW (ref 8.7–10.3)
Chloride: 97 mmol/L (ref 96–106)
Creatinine, Ser: 0.76 mg/dL (ref 0.57–1.00)
Globulin, Total: 2.7 g/dL (ref 1.5–4.5)
Glucose: 346 mg/dL — ABNORMAL HIGH (ref 65–99)
Potassium: 4.7 mmol/L (ref 3.5–5.2)
Sodium: 133 mmol/L — ABNORMAL LOW (ref 134–144)
Total Protein: 5.9 g/dL — ABNORMAL LOW (ref 6.0–8.5)
eGFR: 88 mL/min/{1.73_m2} (ref >59)

## 2020-12-28 LAB — BRAIN NATRIURETIC PEPTIDE: BNP: 60.6 pg/mL (ref 0.0–100.0)

## 2020-12-28 NOTE — Chronic Care Management (AMB) (Signed)
  Care Management   Note  12/28/2020 Name: Amy Hansen MRN: [id number] DOB: 1956/03/27  Amy Hansen is a 65 y.o. year old female who is a primary care patient of [name], NP and is actively engaged with the care management team. I reached out to [name] by phone today to assist with re-scheduling a follow up visit with the Licensed Clinical Social Worker  Follow up plan: Unsuccessful telephone outreach attempt made. A HIPAA compliant phone message was left for the patient providing contact information and requesting a return call.  The care management team will reach out to the patient again over the next 7 days.  If patient returns call to provider office, please advise to call [facility]  at [place], [place], [place], [place] [address] Direct Dial: [phone] [email or web address]: [email or web address]

## 2020-12-29 ENCOUNTER — Telehealth: Payer: Self-pay | Admitting: *Deleted

## 2020-12-29 DIAGNOSIS — Z79899 Other long term (current) drug therapy: Secondary | ICD-10-CM

## 2020-12-29 DIAGNOSIS — I5032 Chronic diastolic (congestive) heart failure: Secondary | ICD-10-CM

## 2020-12-29 NOTE — Telephone Encounter (Signed)
Attempted to call pt to review results. No answer. Lmtcb.

## 2020-12-29 NOTE — Telephone Encounter (Signed)
-----   Message from Loel Dubonnet, NP sent at 12/28/2020  7:49 AM EDT ----- Lab work shows normal kidney function and liver enzymes. Glucose elevated, continue to follow with primary care regarding diabetes. Sodium mildly low, recommend less than 2L fluid intake per day and repeat BMP in 1 week.  Protein and albumin mildly low which can contribute to fluid retention, recommend increased protein intake.  Alkaline phosphatase mildly elevated - avoid Acetaminophen, fried foods, alcohol. CBC with no significant anemia. Mild elevation in platelets which has been seen previously.

## 2021-01-03 NOTE — Telephone Encounter (Signed)
Spoke to pt. Notified of lab results and provider's recc.  Pt verbalized understanding. [facility]  Lab orders placed. Pt has no further questions.

## 2021-01-04 ENCOUNTER — Other Ambulatory Visit: Payer: Self-pay | Admitting: Nurse Practitioner

## 2021-01-04 DIAGNOSIS — E119 Type 2 diabetes mellitus without complications: Secondary | ICD-10-CM

## 2021-01-04 NOTE — Telephone Encounter (Signed)
Requested Prescriptions  Pending Prescriptions Disp Refills  . metFORMIN (GLUCOPHAGE) 1000 MG tablet [Pharmacy Med Name: metFORMIN HCl 1000 MG Oral Tablet] 60 tablet 2    Sig: TAKE 1 TABLET BY MOUTH TWICE DAILY WITH MEALS (APPOINTMENT  REQUIRED  FOR  FUTURE  REFILLS)     Endocrinology:  Diabetes - Biguanides Failed - 01/04/2021  5:30 AM      Failed - HBA1C is between 0 and 7.9 and within 180 days    Hemoglobin A1C  Date Value Ref Range Status  03/21/2016 7.3%  Final   HB A1C (BAYER DCA - WAIVED)  Date Value Ref Range Status  10/27/2020 8.7 (H) <7.0 % Final    Comment:                                          Diabetic Adult            <7.0                                       Healthy Adult        4.3 - 5.7                                                           (DCCT/NGSP) American Diabetes Association's Summary of Glycemic Recommendations for Adults with Diabetes: Hemoglobin A1c <7.0%. More stringent glycemic goals (A1c <6.0%) may further reduce complications at the cost of increased risk of hypoglycemia.          Passed - Cr in normal range and within 360 days    Creatinine  Date Value Ref Range Status  01/10/2012 0.58 (L) 0.60 - 1.30 mg/dL Final   Creatinine, Ser  Date Value Ref Range Status  12/27/2020 0.76 0.57 - 1.00 mg/dL Final         Passed - eGFR in normal range and within 360 days    EGFR (African American)  Date Value Ref Range Status  01/10/2012 >60  Final   GFR calc Af Amer  Date Value Ref Range Status  05/26/2020 112 >59 mL/min/1.73 Final    Comment:    **In accordance with recommendations from the NKF-ASN Task force,**   [facility] is in the process of updating its eGFR calculation to the   2021 CKD-EPI creatinine equation that estimates kidney function   without a race variable.    EGFR (Non-African Amer.)  Date Value Ref Range Status  01/10/2012 >60  Final    Comment:    eGFR values <68m/min/1.73 m2 may be an indication of chronic kidney disease  (CKD). Calculated eGFR is useful in patients with stable renal function. The eGFR calculation will not be reliable in acutely ill patients when serum creatinine is changing rapidly. It is not useful in  patients on dialysis. The eGFR calculation may not be applicable to patients at the low and high extremes of body sizes, pregnant women, and vegetarians.    GFR calc non Af Amer  Date Value Ref Range Status  05/26/2020 97 >59 mL/min/1.73 Final   GFR  Date Value Ref Range Status  10/25/2020 75.04 >  60.00 mL/min Final    Comment:    Calculated using the CKD-EPI Creatinine Equation (2021)   eGFR  Date Value Ref Range Status  12/27/2020 88 >59 mL/min/1.73 Final         Passed - Valid encounter within last 6 months    Recent Outpatient Visits          2 months ago UTI (urinary tract infection), bacterial   [facility] [name], NP   2 months ago Poorly controlled type 2 diabetes mellitus ([place])   [facility] [name], NP   7 months ago Poorly controlled type 2 diabetes mellitus ([place])   [place] Eulogio Bear, NP   8 months ago Hospital discharge follow-up   [place], NP   8 months ago Poorly controlled type 2 diabetes mellitus [facility])   [facility] Eulogio Bear, NP

## 2021-01-05 NOTE — Chronic Care Management (AMB) (Signed)
  Care Management   Note  01/05/2021 Name: Amy Hansen MRN: [id number] DOB: Mar 19, 1956  Amy Hansen is a 65 y.o. year old female who is a primary care patient of [name], NP and is actively engaged with the care management team. I reached out to [name] by phone today to assist with re-scheduling a follow up visit with the Licensed Clinical Social Worker  Follow up plan: Unsuccessful telephone outreach attempt made. A HIPAA compliant phone message was left for the patient providing contact information and requesting a return call.  The care management team will reach out to the patient again over the next 7 days.  If patient returns call to provider office, please advise to call [facility] at [place], [place], [place], [place] [address] Direct Dial: [phone] [email or web address]: [email or web address]

## 2021-01-06 ENCOUNTER — Other Ambulatory Visit: Payer: Self-pay

## 2021-01-06 ENCOUNTER — Emergency Department: Payer: Self-pay

## 2021-01-06 ENCOUNTER — Other Ambulatory Visit: Payer: Self-pay | Admitting: Surgery

## 2021-01-06 ENCOUNTER — Emergency Department
Admission: EM | Admit: 2021-01-06 | Discharge: 2021-01-06 | Disposition: A | Discharge status: Home / Self Care | Payer: Self-pay | Attending: Emergency Medicine | Admitting: Emergency Medicine

## 2021-01-06 DIAGNOSIS — Z87891 Personal history of nicotine dependence: Secondary | ICD-10-CM | POA: Insufficient documentation

## 2021-01-06 DIAGNOSIS — E119 Type 2 diabetes mellitus without complications: Secondary | ICD-10-CM | POA: Insufficient documentation

## 2021-01-06 DIAGNOSIS — Z794 Long term (current) use of insulin: Secondary | ICD-10-CM | POA: Insufficient documentation

## 2021-01-06 DIAGNOSIS — N189 Chronic kidney disease, unspecified: Secondary | ICD-10-CM | POA: Insufficient documentation

## 2021-01-06 DIAGNOSIS — Y9389 Activity, other specified: Secondary | ICD-10-CM | POA: Insufficient documentation

## 2021-01-06 DIAGNOSIS — S61012A Laceration without foreign body of left thumb without damage to nail, initial encounter: Secondary | ICD-10-CM | POA: Insufficient documentation

## 2021-01-06 DIAGNOSIS — W272XXA Contact with scissors, initial encounter: Secondary | ICD-10-CM | POA: Insufficient documentation

## 2021-01-06 DIAGNOSIS — I509 Heart failure, unspecified: Secondary | ICD-10-CM | POA: Insufficient documentation

## 2021-01-06 DIAGNOSIS — Z7984 Long term (current) use of oral hypoglycemic drugs: Secondary | ICD-10-CM | POA: Insufficient documentation

## 2021-01-06 DIAGNOSIS — I130 Hypertensive heart and chronic kidney disease with heart failure and stage 1 through stage 4 chronic kidney disease, or unspecified chronic kidney disease: Secondary | ICD-10-CM | POA: Insufficient documentation

## 2021-01-06 IMAGING — DX DG FINGER THUMB 2+V*L*
3 series · 3 of 3 positions shown · non-contrast
Comparison: None.

CLINICAL DATA: The patient suffered a left thumb laceration on
scissors due to a slip and fall today. Initial encounter.

EXAM:
LEFT THUMB 2+V

[finger ap]
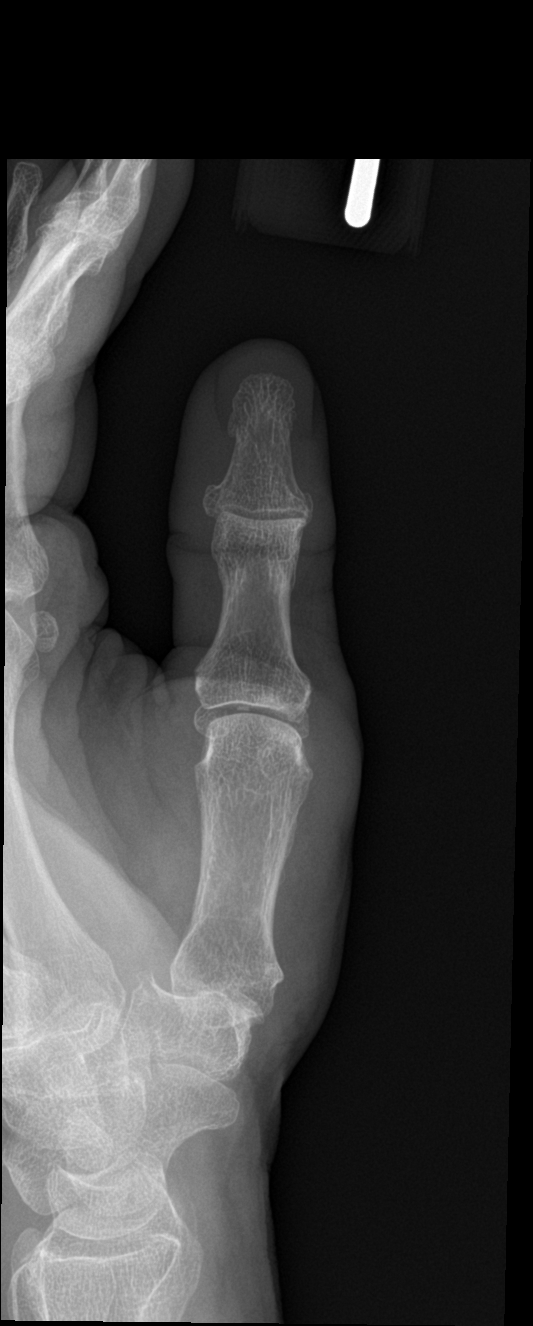

[finger obl]
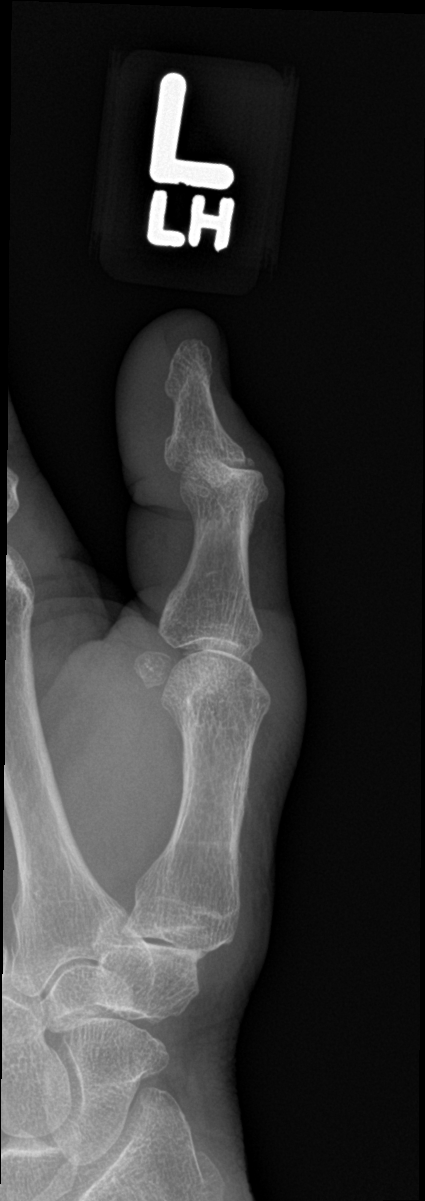

[finger lat]
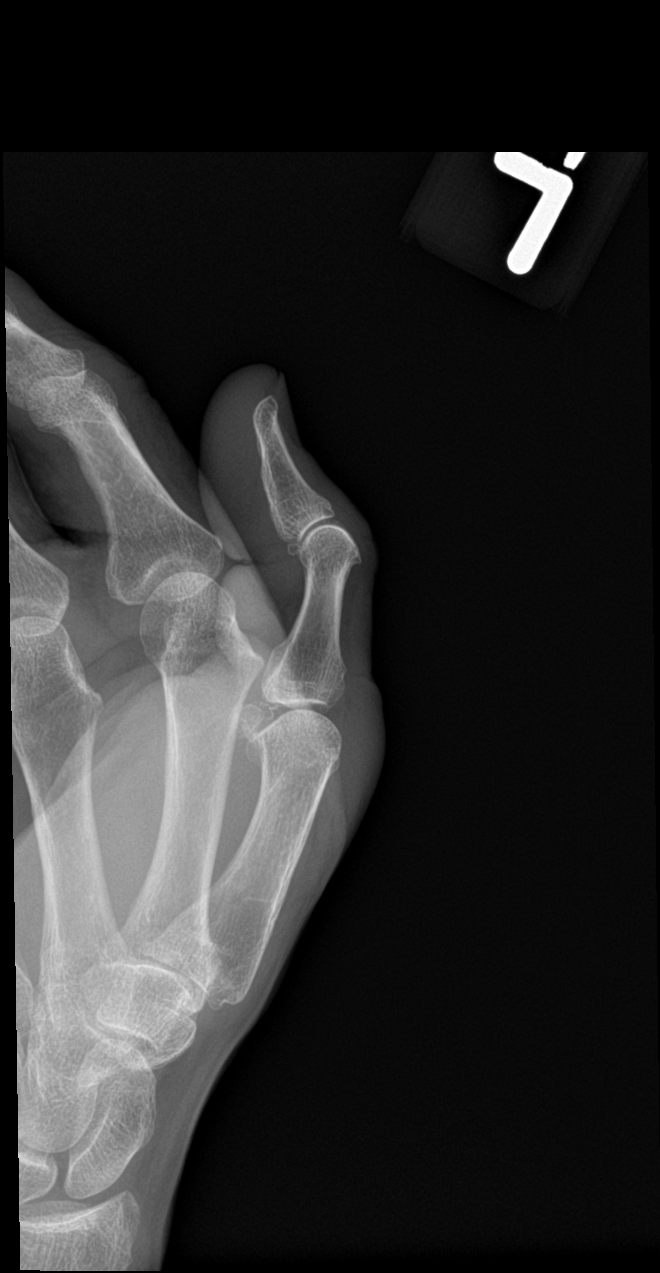

[3 of 3 positions shown; findings below may reference images not displayed]

FINDINGS: There is no evidence of fracture or dislocation. Mild first CMC
osteoarthritis is noted. No radiopaque foreign body. Soft tissues
are unremarkable.
IMPRESSION: No acute abnormality.

## 2021-01-06 MED ORDER — LIDOCAINE HCL (PF) 1 % IJ SOLN
5.0000 mL | Freq: Once | INTRAMUSCULAR | Status: AC
  Administered 2021-01-06: 5 mL
  Filled 2021-01-06: qty 5

## 2021-01-06 MED ORDER — OXYCODONE-ACETAMINOPHEN 5-325 MG PO TABS: 1.0000 | ORAL_TABLET | ORAL | 0 refills | Status: DC | PRN

## 2021-01-06 MED ORDER — BACITRACIN-NEOMYCIN-POLYMYXIN 400-5-5000 EX OINT
TOPICAL_OINTMENT | Freq: Once | CUTANEOUS | Status: AC
  Filled 2021-01-06: qty 1

## 2021-01-06 NOTE — Discharge Instructions (Addendum)
Follow discharge care instructions and take medication as directed.  May remove splint for hygiene purposes and then reapply.

## 2021-01-06 NOTE — ED Notes (Signed)
See triage note  Presents with laceration to left thumb  States she was standing on boxes ..slipped fell  Stats she cut her thumb with the scissors

## 2021-01-06 NOTE — ED Triage Notes (Signed)
Pt states she was breaking down some boxes with scissors and slipped and fell and cut the posterior left thumb, bandage in place on arrival, bleeding is controlled

## 2021-01-06 NOTE — ED Provider Notes (Signed)
[facility] Emergency Department Provider Note   ____________________________________________   Event Date/Time   First MD Initiated Contact with Patient 01/06/21 1021     (approximate)  I have reviewed the triage vital signs and the nursing notes.   HISTORY  Chief Complaint Laceration    HPI Amy Hansen is a 66 y.o. female patient presents with flexion injury and laceration of the left thumb.  Patient states she is loss of balance and fell and cut her thumb on the past open scissors.  Patient denies loss sensation.  Patient stated decreased extension of the thumb after injury.  Patient went to urgent care clinic and was sent to the emergency secondary to suspected tendon injury.  Patient rates her pain as a 6/10.  Patient described pain as "achy".  Patient is left-hand dominant.  Tetanus shot is up-to-date.         Past Medical History:  Diagnosis Date   Anemia    Anxiety    Chronic kidney disease    UTI, hematuria in urine   Colitis    Crohn's disease ([place])    Depression    Diabetes ([place])    Diverticulosis    Frequent headaches    Interstitial cystitis    Recurrent UTI    Restless leg syndrome    Urinary frequency     Patient Active Problem List   Diagnosis Date Noted   Abnormal CT scan, gastrointestinal tract    BRBPR (bright red blood per rectum) 05/01/2020   Weakness 04/25/2020   Pancolitis ([place]) 04/25/2020   Type 2 diabetes mellitus ([place]) 04/25/2020   Colitis 04/21/2020   AKI (acute kidney injury) ([place]) 04/21/2020   Major depression, chronic 06/01/2018   OCD (obsessive compulsive disorder) 06/01/2018   Chronic heart failure with preserved ejection fraction ([place]) 05/16/2018   Left leg pain 11/04/2017   GERD (gastroesophageal reflux disease) 02/18/2017   Iron deficiency anemia 10/18/2016   Leg swelling 07/17/2016   Hypertension 03/21/2016   Vitamin D deficiency 02/21/2016   B12 deficiency 02/21/2016   Poorly controlled  type 2 diabetes mellitus ([place]) 09/13/2015   Migraines 05/23/2015   Restless legs syndrome (RLS) 05/23/2015   Recurrent UTI 04/26/2015   Atrophic vaginitis 04/26/2015   Dyspareunia, female 04/26/2015    Past Surgical History:  Procedure Laterality Date   ABDOMINAL HYSTERECTOMY     bariatric bypass  2012   BIOPSY  05/03/2020   Procedure: BIOPSY;  Surgeon: Milus Banister, MD;  Location: [facility] ENDOSCOPY;  Service: Endoscopy;;   CARPAL TUNNEL RELEASE Right 2003   CARPAL TUNNEL RELEASE Right    2008   CHOLECYSTECTOMY  1975   COLONOSCOPY WITH PROPOFOL N/A 05/03/2020   Procedure: COLONOSCOPY WITH PROPOFOL;  Surgeon: Milus Banister, MD;  Location: [facility]COPY;  Service: Endoscopy;  Laterality: N/A;   CYSTOSCOPY W/ RETROGRADES Bilateral 06/06/2015   Procedure: CYSTOSCOPY WITH RETROGRADE PYELOGRAM;  Surgeon: Festus Aloe, MD;  Location: [facility] ORS;  Service: Urology;  Laterality: Bilateral;   FL INJ LEFT KNEE CT ARTHROGRAM ([facility] HX) Left    1995   GASTRIC BYPASS  2010   HEMORRHOID SURGERY  2013   KNEE ARTHROSCOPY Left 1996   TONSILLECTOMY      Prior to Admission medications   Medication Sig Start Date End Date Taking? Authorizing Provider  oxyCODONE-acetaminophen (PERCOCET) 5-325 MG tablet Take 1 tablet by mouth every 4 (four) hours as needed for severe pain. 01/06/21 01/06/22 Yes Sable Feil, PA-C  ALPRAZolam Duanne Moron)  0.5 MG tablet TAKE 1 TABLET BY MOUTH 4 TIMES DAILY AS NEEDED FOR ANXIETY 10/19/20   Donnal Moat T, PA-C  buPROPion (WELLBUTRIN XL) 150 MG 24 hr tablet Take 3 tablets by mouth once daily 09/29/20   Addison Lank, PA-C  Cholecalciferol (VITAMIN D) 50 MCG (2000 UT) CAPS Take 1 capsule (2,000 Units total) by mouth daily. 09/02/20   Armbruster, Carlota Raspberry, MD  estradiol (ESTRACE) 0.1 MG/GM vaginal cream Apply one pea-sized amount around the opening of the urethra three times weekly. 06/29/20   Vaillancourt, Aldona Bar, PA-C  ferrous sulfate 325 (65 FE) MG EC tablet Take 1 tablet (325  mg total) by mouth 2 (two) times daily. 04/24/20 04/24/21  Lorella Nimrod, MD  furosemide (LASIX) 20 MG tablet Take 1 tablet (21m) daily. Take an additional tablet (266m AS NEEDED. 12/27/20   WaLoel DubonnetNP  furosemide (LASIX) 20 MG tablet Take 2 tablets (4091mdaily x 2 weeks, then take 1 tablet (33m61maily. Take an additional 33mg52mlet AS NEEDED. 12/27/20 01/26/21  WalkeLoel Dubonnet glipiZIDE (GLUCOTROL) 5 MG tablet TAKE 1 TABLET BY MOUTH TWICE DAILY WITH MEALS (APPOINTMENT  REQUIRED  FOR  FUTURE  REFILLS) 11/05/20   HoldsJon Billings insulin glargine, 2 Unit Dial, (TOUJEO MAX SOLOSTAR) 300 UNIT/ML Solostar Pen Inject 24 units daily. Titrate as instructed. Max daily dose 40 units 10/27/20   HoldsJon Billings Insulin Pen Needle (PEN NEEDLES) 32G X 4 MM MISC 1 Units by Does not apply route at bedtime. 12/20/19   Lane,Volney AmericanC  lamoTRIgine (LAMICTAL) 200 MG tablet Take 2 tablets (400 mg total) by mouth at bedtime. 09/02/20   HurstDonnal MoatA-C  loperamide (IMODIUM) 2 MG capsule Take 1 capsule (2 mg total) by mouth as needed for diarrhea or loose stools. 04/24/20   Amin,Lorella Nimrod mesalamine (LIALDA) 1.2 g EC tablet Take 4.8 g by mouth daily with breakfast. 09/02/20   Armbruster, SteveCarlota Raspberry metFORMIN (GLUCOPHAGE) 1000 MG tablet TAKE 1 TABLET BY MOUTH TWICE DAILY WITH MEALS (APPOINTMENT  REQUIRED  FOR  FUTURE  REFILLS) 01/04/21   HoldsJon Billings nystatin (MYCOSTATIN/NYSTOP) powder Apply 1 application topically 3 (three) times daily. Apply to lower abdominal fold tid x 2 to 3 weeks 07/12/20   KenneNoralyn Pick ondansetron (ZOFRAN-ODT) 4 MG disintegrating tablet Take 1 tablet (4 mg total) by mouth every 6 (six) hours as needed for nausea. Place tablet under tongue 10/07/20   Armbruster, SteveCarlota Raspberry pantoprazole (PROTONIX) 40 MG tablet Take 1 tablet (40 mg total) by mouth daily. 05/30/20   KenneNoralyn Pick potassium chloride (KLOR-CON) 10 MEQ  tablet Take 1 tablet (10 mEq total) by mouth daily for 14 days. 12/27/20 01/10/21  WalkeLoel Dubonnet pramipexole (MIRAPEX) 1 MG tablet Take 1 tablet (1 mg total) by mouth at bedtime. 11/16/20   Cannady, JolenHenrine ScrewsP  predniSONE (DELTASONE) 10 MG tablet Take 4 tablets (40 mg total) by mouth daily with breakfast for 14 days, THEN 3.5 tablets (35 mg total) daily with breakfast for 7 days, THEN 3 tablets (30 mg total) daily with breakfast for 7 days, THEN 2.5 tablets (25 mg total) daily with breakfast for 7 days, THEN 2 tablets (20 mg total) daily with breakfast for 7 days, THEN 1.5 tablets (15 mg total) daily with breakfast for 7 days, THEN 1 tablet (10 mg total) daily with breakfast for 7  days, THEN 0.5 tablets (5 mg total) daily with breakfast for 7 days. 12/02/20 02/03/21  Armbruster, Carlota Raspberry, MD  risperiDONE (RISPERDAL) 3 MG tablet Take 1 tablet (3 mg total) by mouth at bedtime. 09/08/20   Donnal Moat T, PA-C  sertraline (ZOLOFT) 100 MG tablet TAKE 1 & 1/2 (ONE & ONE-HALF) TABLETS BY MOUTH ONCE DAILY 09/02/20   Addison Lank, PA-C  Turmeric, Lear Ng, [place]) POWD Take as directed 10/25/20   Armbruster, Carlota Raspberry, MD    Allergies Avelox [moxifloxacin hcl in nacl], Bactrim [sulfamethoxazole-trimethoprim], Ciprofloxacin, Depakote [divalproex sodium], Imitrex [sumatriptan], and Stadol [butorphanol]  Family History  Problem Relation Age of Onset   Stroke Father    Colon cancer Mother    Heart failure Sister    Bladder Cancer Neg Hx    Kidney disease Neg Hx    Prostate cancer Neg Hx    Kidney cancer Neg Hx    Pancreatic cancer Neg Hx    Esophageal cancer Neg Hx    Stomach cancer Neg Hx    Rectal cancer Neg Hx     Social History Social History   Tobacco Use   Smoking status: Former    Packs/day: 0.50    Pack years: 0.00    Types: Cigarettes    Quit date: 04/25/1975    Years since quitting: 45.7   Smokeless tobacco: Never   Tobacco comments:    quit 40 years  Vaping Use    Vaping Use: Never used  Substance Use Topics   Alcohol use: No    Alcohol/week: 0.0 standard drinks   Drug use: No    Review of Systems  Constitutional: No fever/chills Eyes: No visual changes. ENT: No sore throat. Cardiovascular: Denies chest pain. Respiratory: Denies shortness of breath. Gastrointestinal: No abdominal pain.  No nausea, no vomiting.  No diarrhea.  No constipation. Genitourinary: Negative for dysuria. Musculoskeletal: Negative for back pain. Skin: Negative for rash.  Left thumb laceration. Neurological: Negative for headaches, focal weakness or numbness. Psychiatric: Anxiety and depression. Endocrine: Diabetes Allergic/Immunilogical: See extensive medication allergy list ____________________________________________   PHYSICAL EXAM:  VITAL SIGNS: ED Triage Vitals  Enc Vitals Group     BP 01/06/21 1016 120/85     Pulse Rate 01/06/21 1016 100     Resp 01/06/21 1016 18     Temp 01/06/21 1016 98 F (36.7 C)     Temp Source 01/06/21 1016 Oral     SpO2 01/06/21 1016 95 %     Weight 01/06/21 1006 130 lb (59 kg)     Height 01/06/21 1006 5' 1"  (1.549 m)     Head Circumference --      Peak Flow --      Pain Score 01/06/21 1006 6     Pain Loc --      Pain Edu? --      Excl. in [place]? --     Constitutional: Alert and oriented. Well appearing and in no acute distress. Eyes: Conjunctivae are normal. PERRL. EOMI. Head: Atraumatic. Nose: No congestion/rhinnorhea. Mouth/Throat: Mucous membranes are moist.  Oropharynx non-erythematous. Neck: No stridor.  No cervical spine tenderness to palpation. Hematological/Lymphatic/Immunilogical: No cervical lymphadenopathy. Cardiovascular: Normal rate, regular rhythm. Grossly normal heart sounds.  Good peripheral circulation. Respiratory: Normal respiratory effort.  No retractions. Lungs CTAB. Gastrointestinal: Soft and nontender. No distention. No abdominal bruits. No CVA tenderness. Genitourinary:  Musculoskeletal: No  lower extremity tenderness nor edema.  No joint effusions. Neurologic:  Normal speech and language. No gross  focal neurologic deficits are appreciated. No gait instability. Skin:  Skin is warm, dry and intact. No rash noted. Psychiatric: Mood and affect are normal. Speech and behavior are normal.  ____________________________________________   LABS (all labs ordered are listed, but only abnormal results are displayed)  Labs Reviewed - No data to display ____________________________________________  EKG   ____________________________________________  RADIOLOGY I, Sable Feil, personally viewed and evaluated these images (plain radiographs) as part of my medical decision making, as well as reviewing the written report by the radiologist.  ED MD interpretation:    Official radiology report(s): DG Finger Thumb Left  Result Date: 01/06/2021 CLINICAL DATA:  The patient suffered a left thumb laceration on scissors due to a slip and fall today. Initial encounter. EXAM: LEFT THUMB 2+V COMPARISON:  None. FINDINGS: There is no evidence of fracture or dislocation. Mild first CMC osteoarthritis is noted. No radiopaque foreign body. Soft tissues are unremarkable. IMPRESSION: No acute abnormality. Electronically Signed   By: Inge Rise M.D.   On: 01/06/2021 11:10    ____________________________________________   PROCEDURES  Procedure(s) performed (including Critical Care):  Marland KitchenMarland KitchenLaceration Repair  Date/Time: 01/06/2021 12:04 PM Performed by: Sable Feil, PA-C Authorized by: Sable Feil, PA-C   Consent:    Consent obtained:  Verbal   Consent given by:  Patient   Risks discussed:  Infection, pain, tendon damage, poor cosmetic result, nerve damage, poor wound healing and need for additional repair Universal protocol:    Procedure explained and questions answered to patient or proxy's satisfaction: yes     Imaging studies available: yes     Immediately prior to procedure, a  time out was called: yes     Patient identity confirmed:  Verbally with patient Anesthesia:    Anesthesia method:  Nerve block   Block anesthetic:  Lidocaine 1% w/o epi   Block injection procedure:  Anatomic landmarks identified, incremental injection and anatomic landmarks palpated   Block outcome:  Anesthesia achieved Laceration details:    Location:  Finger   Finger location:  L thumb   Length (cm):  1.5   Depth (mm):  2 Pre-procedure details:    Preparation:  Imaging obtained to evaluate for foreign bodies Exploration:    Limited defect created (wound extended): no     Hemostasis achieved with:  Tourniquet   Wound exploration: wound explored through full range of motion and entire depth of wound visualized     Contaminated: no   Treatment:    Area cleansed with:  Povidone-iodine and saline   Amount of cleaning:  Standard   Irrigation solution:  Sterile saline   Irrigation method:  Syringe   Visualized foreign bodies/material removed: no     Debridement:  None   Undermining:  None   Scar revision: no   Skin repair:    Repair method:  Sutures   Suture size:  4-0   Suture material:  Nylon   Suture technique:  Simple interrupted   Number of sutures:  6 Approximation:    Approximation:  Close Repair type:    Repair type:  Simple Post-procedure details:    Dressing:  Antibiotic ointment, sterile dressing and splint for protection   Procedure completion:  Tolerated well, no immediate complications   ____________________________________________   INITIAL IMPRESSION / ASSESSMENT AND PLAN / ED COURSE  As part of my medical decision making, I reviewed the following data within the electronic MEDICAL RECORD NUMBER         Patient  presents for laceration and a flexor deformity to the left thumb.  No acute findings on x-ray.  Patient complaint physical exam consistent with laceration and tendon injury to the left thumb.  Discussed with supervising doctor, Ortho on-call, and hand  surgeon.  See procedure note for wound closure.  Patient will follow-up with [facility] for tendon repair per schedule appointment.  Patient given discharge care instruction advised take pain medication as needed.     ____________________________________________   FINAL CLINICAL IMPRESSION(S) / ED DIAGNOSES  Final diagnoses:  Laceration of left thumb without damage to nail, foreign body presence unspecified, initial encounter     ED Discharge Orders          Ordered    oxyCODONE-acetaminophen (PERCOCET) 5-325 MG tablet  Every 4 hours PRN        01/06/21 1203             Note:  This document was prepared using Dragon voice recognition software and may include unintentional dictation errors.    Sable Feil, PA-C 01/06/21 1211    Lucrezia Starch, MD 01/08/21 [phone]

## 2021-01-09 ENCOUNTER — Telehealth: Payer: Self-pay

## 2021-01-10 ENCOUNTER — Ambulatory Visit: Admit: 2021-01-10 | Payer: Self-pay | Admitting: Surgery

## 2021-01-10 SURGERY — REPAIR, TENDON, EXTENSOR
Anesthesia: Choice | Site: Thumb | Laterality: Left

## 2021-01-19 HISTORY — PX: HAND SURGERY: SHX662

## 2021-01-20 ENCOUNTER — Ambulatory Visit: Payer: Self-pay | Admitting: Gastroenterology

## 2021-01-23 ENCOUNTER — Ambulatory Visit: Payer: Self-pay | Admitting: Gastroenterology

## 2021-01-24 ENCOUNTER — Telehealth: Payer: Self-pay

## 2021-01-24 ENCOUNTER — Telehealth: Payer: Self-pay | Admitting: General Practice

## 2021-01-24 NOTE — Telephone Encounter (Signed)
  Care Management   Follow Up Note   01/24/2021 Name: Amy Hansen MRN: [id number] DOB: 02-06-1956   Referred by: [name], NP Reason for referral : Care Coordination (RNCM: Follow up call for Chronic Disease Management and Care Coordination Needs )   An unsuccessful telephone outreach was attempted today. The patient was referred to the case management team for assistance with care management and care coordination.   Follow Up Plan: A HIPPA compliant phone message was left for the patient providing contact information and requesting a return call.   Noreene Larsson RN, MSN, [facility] Mobile: [phone]

## 2021-02-01 ENCOUNTER — Other Ambulatory Visit: Payer: Self-pay | Admitting: Gastroenterology

## 2021-02-01 ENCOUNTER — Telehealth: Payer: Self-pay | Admitting: Gastroenterology

## 2021-02-01 DIAGNOSIS — K523 Indeterminate colitis: Secondary | ICD-10-CM

## 2021-02-01 DIAGNOSIS — K529 Noninfective gastroenteritis and colitis, unspecified: Secondary | ICD-10-CM

## 2021-02-01 DIAGNOSIS — R197 Diarrhea, unspecified: Secondary | ICD-10-CM

## 2021-02-01 NOTE — Telephone Encounter (Signed)
Okay thanks very much I appreciate the follow-up

## 2021-02-01 NOTE — Telephone Encounter (Signed)
Spoke with patient, she states that she finished her Prednisone taper on Monday and feels bad. She states that on Monday she had a fever of 101 degrees. Patient states that she has had vomiting and diarrhea. She states that she has diarrhea anytime she eats or drinks anything. She states that she did notice BRB in her stool once there other day but hasn't seen anymore. She states that she is not eating solid foods and trying to stay on clear liquids. She is currently taking Lialda 4.8 g daily. No fever today. Patient is wanting to know if she needs to have more steroids. Please advise, thanks.

## 2021-02-01 NOTE — Telephone Encounter (Signed)
Spoke with patient in regards to recommendations. She will come in the morning for lab work and to pick up stool kit. She is aware that no appt is necessary. Patient states that she started a new job 2 months ago and has to work 90 days before she is eligible for insurance. She states that she will be without insurance for another month. Advised patient to hold imodium until we receive stool study results back. Patient verbalized understanding of all information and had no concerns at the end of the call.

## 2021-02-01 NOTE — Telephone Encounter (Signed)
Lm on vm for patient to return call 

## 2021-02-01 NOTE — Telephone Encounter (Signed)
Inbound call from patient. States she is having a colitis flare up and requesting steroids or if she need OV. Best contact number [phone]

## 2021-02-01 NOTE — Telephone Encounter (Signed)
Lm on vm for patient to return call.  Lab orders in epic.

## 2021-02-01 NOTE — Telephone Encounter (Signed)
Sorry to hear this. With her fever would recommend C diff PCR to make sure negative and CBC to ensure stable. Can you clarify if she had made any progress in getting insurance? She warrants biologic therapy but does not have insurance so we have not pursued it yet, but I am concerned she will continue to have flares until she can escalate therapy to a biologic. If she does have insurance her preference would be Entyvio.   Otherwise, I would like to get her stool study and labs back first but if symptoms persist I agree she probably needs steroids again and will plan on starting that if her C diff is negative. Thanks

## 2021-02-02 ENCOUNTER — Other Ambulatory Visit: Payer: Self-pay | Admitting: Gastroenterology

## 2021-02-02 ENCOUNTER — Other Ambulatory Visit (INDEPENDENT_AMBULATORY_CARE_PROVIDER_SITE_OTHER): Payer: BC Managed Care – PPO

## 2021-02-02 DIAGNOSIS — K529 Noninfective gastroenteritis and colitis, unspecified: Secondary | ICD-10-CM

## 2021-02-02 DIAGNOSIS — K51919 Ulcerative colitis, unspecified with unspecified complications: Secondary | ICD-10-CM

## 2021-02-02 DIAGNOSIS — R197 Diarrhea, unspecified: Secondary | ICD-10-CM | POA: Diagnosis not present

## 2021-02-02 DIAGNOSIS — K523 Indeterminate colitis: Secondary | ICD-10-CM | POA: Diagnosis not present

## 2021-02-02 DIAGNOSIS — A0472 Enterocolitis due to Clostridium difficile, not specified as recurrent: Secondary | ICD-10-CM

## 2021-02-02 LAB — CBC WITH DIFFERENTIAL/PLATELET
Basophils Absolute: 0 10*3/uL (ref 0.0–0.1)
Basophils Relative: 0.1 % (ref 0.0–3.0)
Eosinophils Absolute: 0 10*3/uL (ref 0.0–0.7)
Eosinophils Relative: 0 % (ref 0.0–5.0)
HCT: 33.9 % — ABNORMAL LOW (ref 36.0–46.0)
Hemoglobin: 10.9 g/dL — ABNORMAL LOW (ref 12.0–15.0)
Lymphocytes Relative: 4.3 % — ABNORMAL LOW (ref 12.0–46.0)
Lymphs Abs: 0.8 10*3/uL (ref 0.7–4.0)
MCHC: 32.2 g/dL (ref 30.0–36.0)
MCV: 84.5 fl (ref 78.0–100.0)
Monocytes Absolute: 1.3 10*3/uL — ABNORMAL HIGH (ref 0.1–1.0)
Monocytes Relative: 7.5 % (ref 3.0–12.0)
Neutro Abs: 15.5 10*3/uL — ABNORMAL HIGH (ref 1.4–7.7)
Neutrophils Relative %: 88.1 % — ABNORMAL HIGH (ref 43.0–77.0)
Platelets: 576 10*3/uL — ABNORMAL HIGH (ref 150.0–400.0)
RBC: 4.01 Mil/uL (ref 3.87–5.11)
RDW: 15.7 % — ABNORMAL HIGH (ref 11.5–15.5)
WBC: 17.6 10*3/uL — ABNORMAL HIGH (ref 4.0–10.5)

## 2021-02-02 MED ORDER — VANCOMYCIN HCL 125 MG PO CAPS
125.0000 mg | ORAL_CAPSULE | Freq: Four times a day (QID) | ORAL | 0 refills | Status: DC
Start: 2021-02-02 — End: 2021-02-10

## 2021-02-02 MED ORDER — PREDNISONE 10 MG PO TABS: ORAL_TABLET | ORAL | 0 refills | Status: DC

## 2021-02-02 NOTE — Progress Notes (Signed)
See telephone note for details

## 2021-02-02 NOTE — Telephone Encounter (Signed)
I received a voicemail from patient requesting a prescription for Prednisone be sent to the pharmacy. She came in for lab work today. She states that she is afraid that she will get worse going into the weekend with no medication. Please advise, thanks.

## 2021-02-03 ENCOUNTER — Other Ambulatory Visit: Payer: Self-pay

## 2021-02-03 DIAGNOSIS — K529 Noninfective gastroenteritis and colitis, unspecified: Secondary | ICD-10-CM

## 2021-02-03 DIAGNOSIS — R197 Diarrhea, unspecified: Secondary | ICD-10-CM

## 2021-02-03 DIAGNOSIS — K523 Indeterminate colitis: Secondary | ICD-10-CM

## 2021-02-03 NOTE — Telephone Encounter (Signed)
Spoke with patient, she states that she has not had a fever since Wednesday. Diarrhea is not as bad, still kind of watery, but she is not going as often. No blood. Feeling better. Patient would like to know if you want her to go ahead and start vancomycin? Please advise, thanks.

## 2021-02-03 NOTE — Telephone Encounter (Signed)
Noted, will await patient's call with an update on symptoms.

## 2021-02-03 NOTE — Telephone Encounter (Signed)
Called patient.  She states she has been feeling much better since her dose of steroid yesterday.  She has not started empiric vancomycin.  She submitted C. difficile stool test and that is pending.  Given she is feeling much better with just steroids we will hold off on empiric vancomycin for now and await her stool test.  She will continue the steroids as previously recommended, 40 mg/day for 2 weeks followed by a slow taper.  She anticipates getting insurance in the next month and will contact me when she does so so we can coordinate biologic therapy for her.  If she has any worsening over the weekend I asked her to contact us.  She agreed

## 2021-02-03 NOTE — Telephone Encounter (Signed)
Pt called to update you on her sxs.

## 2021-02-06 NOTE — Telephone Encounter (Signed)
Inbound call from patient stating she started to feel ill again since yesterday and is wanting to know if she should take the antibiotics.  Please advise.

## 2021-02-06 NOTE — Telephone Encounter (Signed)
Noted, thank you

## 2021-02-06 NOTE — Telephone Encounter (Signed)
Safety zone portal event entered, event [id number]

## 2021-02-06 NOTE — Telephone Encounter (Signed)
Not sure why the lab did that. Can you help submit a safety portal zone on this, as this error is unfortunately now complicating her management.  I would ask her to come back to the lab to submit a C Diff PCR if possible, not sure if she is feeling well enough to do that, but if she is having chills may be best to just start the oral vancomycin and treat empirically for C diff given she already has it at home and let's see how she does. She should also continue prednisone. If she is not feeling better tomorrow she should call us back, may repeat her labs. If she has high fever, worsening symptoms in the interim on this regimen, she may need to go to the ED. She should contact us should that occur. Thanks

## 2021-02-06 NOTE — Addendum Note (Signed)
Addended by: Yevette Edwards on: 02/06/2021 01:23 PM   Modules accepted: Orders

## 2021-02-06 NOTE — Telephone Encounter (Signed)
Spoke with patient, she states that she felt better Friday and Saturday but then began having chills yesterday and today. No fever noted at this time, pt states that usually when she has consistent chills a fever will follow. She reports that she has had 8 episodes of watery diarrhea today. Currently on Prednisone 40 mg. I just checked the stool study results and it looks like the lab has been cancelled, it looks like it was received at the wrong temperature. Please advise, thanks.

## 2021-02-06 NOTE — Chronic Care Management (AMB) (Signed)
  Care Management   Note  02/06/2021 Name: DAVEIGH BATTY MRN: [id number] DOB: 04/20/1956  Amy Hansen is a 65 y.o. year old female who is a primary care patient of [name], NP and is actively engaged with the care management team. I reached out to [name] by phone today to assist with re-scheduling a follow up visit with the RN Case Manager and Licensed Clinical Social Worker  Follow up plan: Patient declines further follow up and engagement by the care management team. Appropriate care team members and provider have been notified via electronic communication.   Noreene Larsson, [place], [place], [place] [address] Direct Dial: [phone] [email or web address]

## 2021-02-06 NOTE — Telephone Encounter (Signed)
Spoke with patient in regards to recommendations. Patient will have her husband come by to pick up new kit. Patient will start oral vancomycin and will continue Prednisone. Patient now reports fever of 102. Advised patient that if her symptoms worsen throughout the day or her fever increases she will need to go to the ED for evaluation. Patient will call tomorrow if she is not any better. Patient verbalized understanding of all information and had no concerns at the end of the call.  New lab order in epic.

## 2021-02-06 NOTE — Telephone Encounter (Signed)
Patient declined rescheduling f/u

## 2021-02-08 ENCOUNTER — Encounter (HOSPITAL_COMMUNITY): Payer: Self-pay

## 2021-02-08 ENCOUNTER — Other Ambulatory Visit: Payer: Self-pay

## 2021-02-08 ENCOUNTER — Emergency Department (HOSPITAL_COMMUNITY): Payer: BC Managed Care – PPO

## 2021-02-08 ENCOUNTER — Inpatient Hospital Stay (HOSPITAL_COMMUNITY)
Admission: EM | Admit: 2021-02-08 | Discharge: 2021-02-10 | DRG: 386 | Disposition: A | Discharge status: Home / Self Care | Payer: BC Managed Care – PPO | Attending: Family Medicine | Admitting: Family Medicine

## 2021-02-08 DIAGNOSIS — K51919 Ulcerative colitis, unspecified with unspecified complications: Secondary | ICD-10-CM

## 2021-02-08 DIAGNOSIS — E1165 Type 2 diabetes mellitus with hyperglycemia: Secondary | ICD-10-CM | POA: Diagnosis not present

## 2021-02-08 DIAGNOSIS — D62 Acute posthemorrhagic anemia: Secondary | ICD-10-CM | POA: Diagnosis present

## 2021-02-08 DIAGNOSIS — Z8249 Family history of ischemic heart disease and other diseases of the circulatory system: Secondary | ICD-10-CM

## 2021-02-08 DIAGNOSIS — K529 Noninfective gastroenteritis and colitis, unspecified: Secondary | ICD-10-CM

## 2021-02-08 DIAGNOSIS — Z6824 Body mass index (BMI) 24.0-24.9, adult: Secondary | ICD-10-CM

## 2021-02-08 DIAGNOSIS — Z800 Family history of malignant neoplasm of digestive organs: Secondary | ICD-10-CM

## 2021-02-08 DIAGNOSIS — Y929 Unspecified place or not applicable: Secondary | ICD-10-CM

## 2021-02-08 DIAGNOSIS — K523 Indeterminate colitis: Secondary | ICD-10-CM

## 2021-02-08 DIAGNOSIS — F32A Depression, unspecified: Secondary | ICD-10-CM | POA: Diagnosis not present

## 2021-02-08 DIAGNOSIS — Z9884 Bariatric surgery status: Secondary | ICD-10-CM

## 2021-02-08 DIAGNOSIS — E440 Moderate protein-calorie malnutrition: Secondary | ICD-10-CM | POA: Diagnosis not present

## 2021-02-08 DIAGNOSIS — N3000 Acute cystitis without hematuria: Secondary | ICD-10-CM | POA: Diagnosis not present

## 2021-02-08 DIAGNOSIS — Z823 Family history of stroke: Secondary | ICD-10-CM | POA: Diagnosis not present

## 2021-02-08 DIAGNOSIS — R197 Diarrhea, unspecified: Secondary | ICD-10-CM | POA: Diagnosis not present

## 2021-02-08 DIAGNOSIS — Z20822 Contact with and (suspected) exposure to covid-19: Secondary | ICD-10-CM | POA: Diagnosis not present

## 2021-02-08 DIAGNOSIS — Z7984 Long term (current) use of oral hypoglycemic drugs: Secondary | ICD-10-CM

## 2021-02-08 DIAGNOSIS — Z87440 Personal history of urinary (tract) infections: Secondary | ICD-10-CM | POA: Diagnosis not present

## 2021-02-08 DIAGNOSIS — E43 Unspecified severe protein-calorie malnutrition: Secondary | ICD-10-CM | POA: Insufficient documentation

## 2021-02-08 DIAGNOSIS — A09 Infectious gastroenteritis and colitis, unspecified: Secondary | ICD-10-CM | POA: Diagnosis not present

## 2021-02-08 DIAGNOSIS — K5090 Crohn's disease, unspecified, without complications: Secondary | ICD-10-CM | POA: Diagnosis not present

## 2021-02-08 DIAGNOSIS — E871 Hypo-osmolality and hyponatremia: Secondary | ICD-10-CM | POA: Diagnosis present

## 2021-02-08 DIAGNOSIS — R748 Abnormal levels of other serum enzymes: Secondary | ICD-10-CM | POA: Diagnosis present

## 2021-02-08 DIAGNOSIS — Z9049 Acquired absence of other specified parts of digestive tract: Secondary | ICD-10-CM | POA: Diagnosis not present

## 2021-02-08 DIAGNOSIS — F419 Anxiety disorder, unspecified: Secondary | ICD-10-CM | POA: Diagnosis not present

## 2021-02-08 DIAGNOSIS — Z794 Long term (current) use of insulin: Secondary | ICD-10-CM

## 2021-02-08 DIAGNOSIS — K5010 Crohn's disease of large intestine without complications: Secondary | ICD-10-CM | POA: Diagnosis not present

## 2021-02-08 DIAGNOSIS — G2581 Restless legs syndrome: Secondary | ICD-10-CM | POA: Diagnosis present

## 2021-02-08 DIAGNOSIS — E876 Hypokalemia: Secondary | ICD-10-CM | POA: Diagnosis present

## 2021-02-08 DIAGNOSIS — Z90710 Acquired absence of both cervix and uterus: Secondary | ICD-10-CM | POA: Diagnosis not present

## 2021-02-08 DIAGNOSIS — E861 Hypovolemia: Secondary | ICD-10-CM | POA: Diagnosis not present

## 2021-02-08 DIAGNOSIS — T380X5A Adverse effect of glucocorticoids and synthetic analogues, initial encounter: Secondary | ICD-10-CM | POA: Diagnosis present

## 2021-02-08 DIAGNOSIS — K50118 Crohn's disease of large intestine with other complication: Secondary | ICD-10-CM | POA: Diagnosis not present

## 2021-02-08 DIAGNOSIS — Z79899 Other long term (current) drug therapy: Secondary | ICD-10-CM

## 2021-02-08 DIAGNOSIS — Z87891 Personal history of nicotine dependence: Secondary | ICD-10-CM

## 2021-02-08 LAB — CBC
HCT: 38 % (ref 36.0–46.0)
Hemoglobin: 12 g/dL (ref 12.0–15.0)
MCH: 26.8 pg (ref 26.0–34.0)
MCHC: 31.6 g/dL (ref 30.0–36.0)
MCV: 85 fL (ref 80.0–100.0)
Platelets: 639 10*3/uL — ABNORMAL HIGH (ref 150–400)
RBC: 4.47 MIL/uL (ref 3.87–5.11)
RDW: 14.6 % (ref 11.5–15.5)
WBC: 16.3 10*3/uL — ABNORMAL HIGH (ref 4.0–10.5)
nRBC: 0 % (ref 0.0–0.2)

## 2021-02-08 LAB — URINALYSIS, ROUTINE W REFLEX MICROSCOPIC
Bilirubin Urine: NEGATIVE
Glucose, UA: >=500 mg/dL — AB
Hgb urine dipstick: NEGATIVE
Ketones, ur: 5 mg/dL — AB
Nitrite: POSITIVE — AB
Protein, ur: NEGATIVE mg/dL
Specific Gravity, Urine: 1.021 (ref 1.005–1.030)
pH: 6 (ref 5.0–8.0)

## 2021-02-08 LAB — COMPREHENSIVE METABOLIC PANEL
ALT: 11 U/L (ref 0–44)
AST: 9 U/L — ABNORMAL LOW (ref 15–41)
Albumin: 2.3 g/dL — ABNORMAL LOW (ref 3.5–5.0)
Alkaline Phosphatase: 130 U/L — ABNORMAL HIGH (ref 38–126)
Anion gap: 12 (ref 5–15)
BUN: 5 mg/dL — ABNORMAL LOW (ref 8–23)
CO2: 21 mmol/L — ABNORMAL LOW (ref 22–32)
Calcium: 8.3 mg/dL — ABNORMAL LOW (ref 8.9–10.3)
Chloride: 93 mmol/L — ABNORMAL LOW (ref 98–111)
Creatinine, Ser: 0.7 mg/dL (ref 0.44–1.00)
GFR, Estimated: >60 mL/min (ref >60)
Glucose, Bld: 453 mg/dL — ABNORMAL HIGH (ref 70–99)
Potassium: 3.5 mmol/L (ref 3.5–5.1)
Sodium: 126 mmol/L — ABNORMAL LOW (ref 135–145)
Total Bilirubin: 0.5 mg/dL (ref 0.3–1.2)
Total Protein: 6.5 g/dL (ref 6.5–8.1)

## 2021-02-08 LAB — LACTIC ACID, PLASMA
Lactic Acid, Venous: 3.2 mmol/L (ref 0.5–1.9)
Lactic Acid, Venous: 0.9 mmol/L (ref 0.5–1.9)

## 2021-02-08 LAB — LIPASE, BLOOD: Lipase: 21 U/L (ref 11–51)

## 2021-02-08 IMAGING — CT CT ABD-PELV W/ CM
2 of 5 series · 15 of 46 positions shown, 17 images · IV contrast (Omni 300)
Comparison: CT abdomen pelvis dated [DATE].

CLINICAL DATA: 64-year-old female with abdominal pain and fever.

EXAM:
CT ABDOMEN AND PELVIS WITH CONTRAST
TECHNIQUE: Multidetector CT imaging of the abdomen and pelvis was performed
using the standard protocol following bolus administration of
intravenous contrast.
CONTRAST:  100mL OMNIPAQUE IOHEXOL 300 MG/ML  SOLN

[Series 3: a/p w/ 5mm · axial · 0.70mm/px · z∈[+73,+498]mm · 12 of 97 slices shown, 14 images]
[im 6/97  soft-tissue]
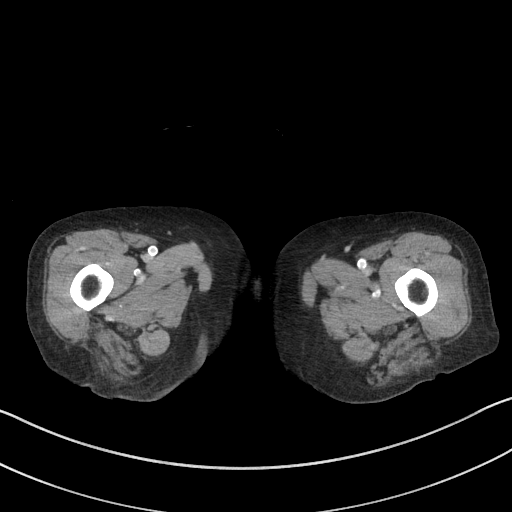
[im 6/97  bone]
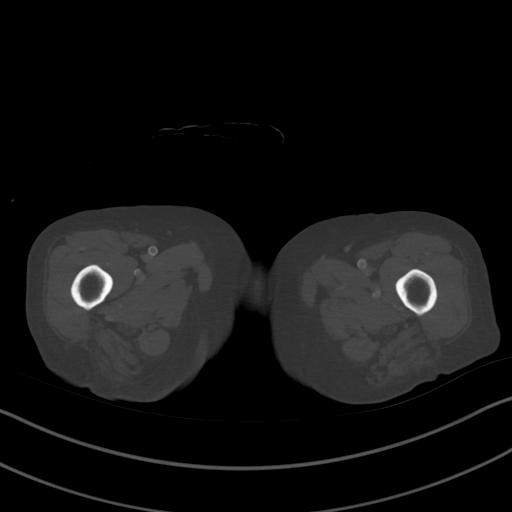
[im 17/97  soft-tissue]
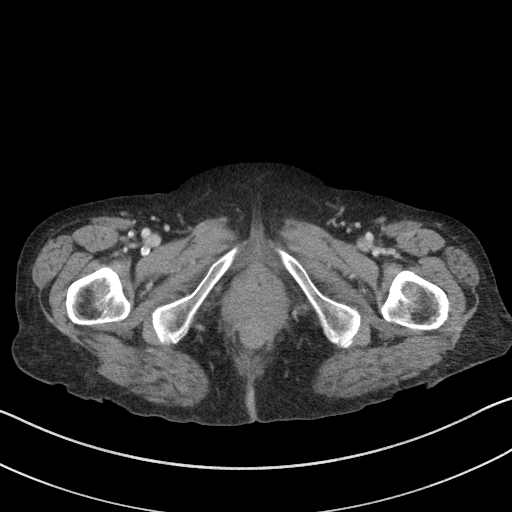
[im 22/97  soft-tissue]
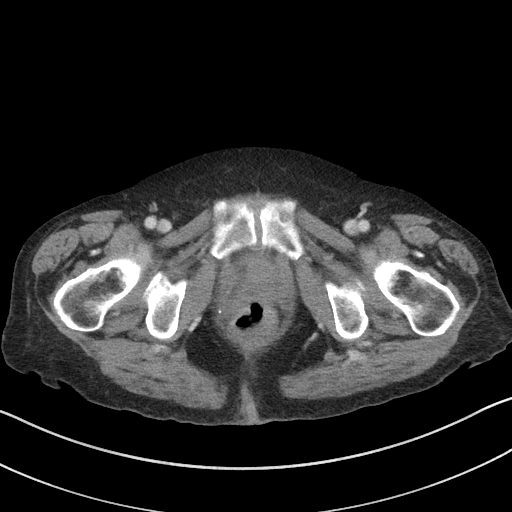
[im 27/97  soft-tissue]
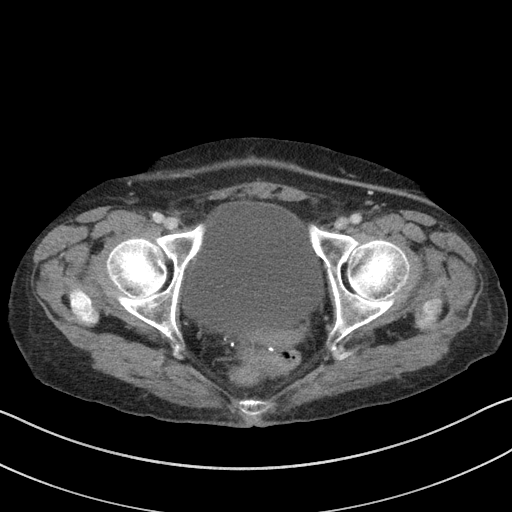
[im 38/97  soft-tissue]
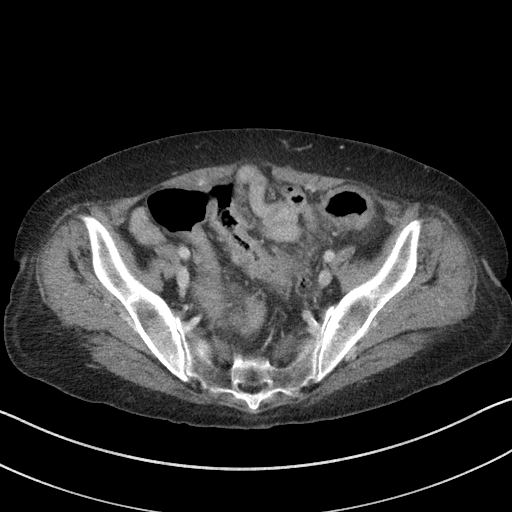
[im 43/97  soft-tissue]
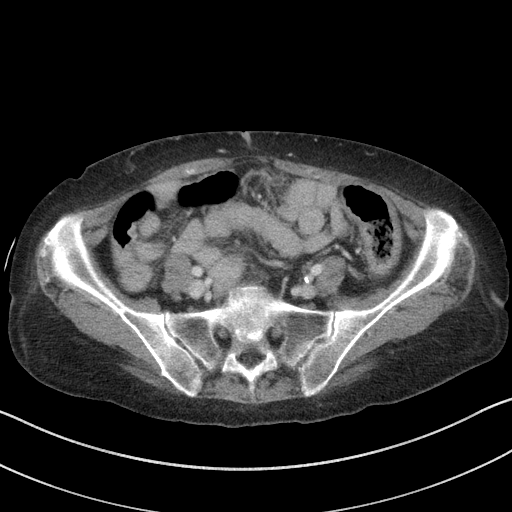
[im 54/97  soft-tissue]
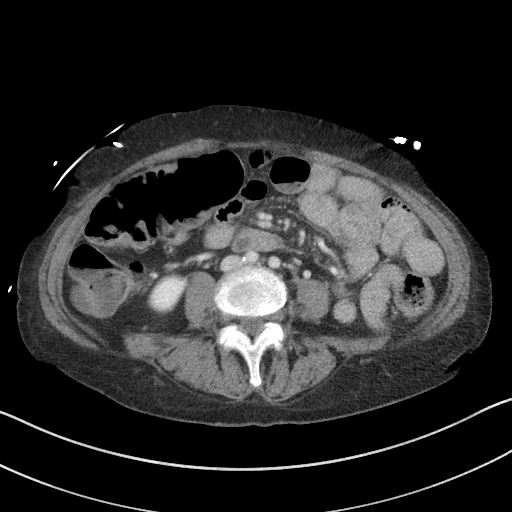
[im 59/97  soft-tissue]
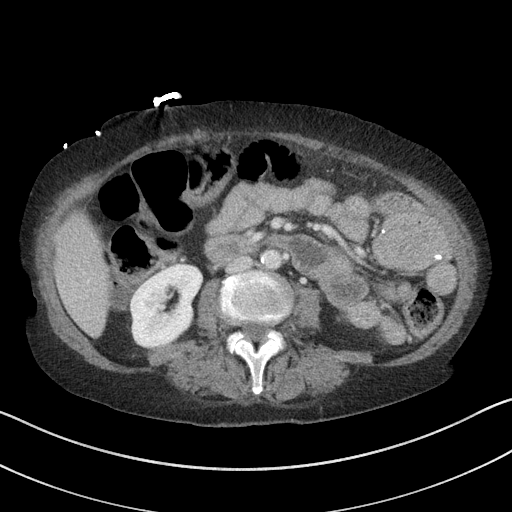
[im 70/97  soft-tissue]
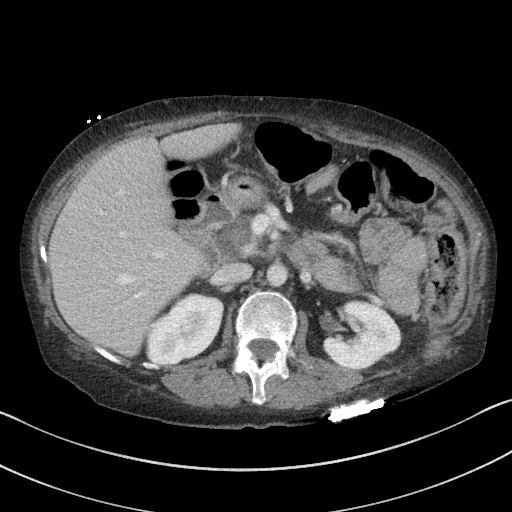
[im 70/97  bone]
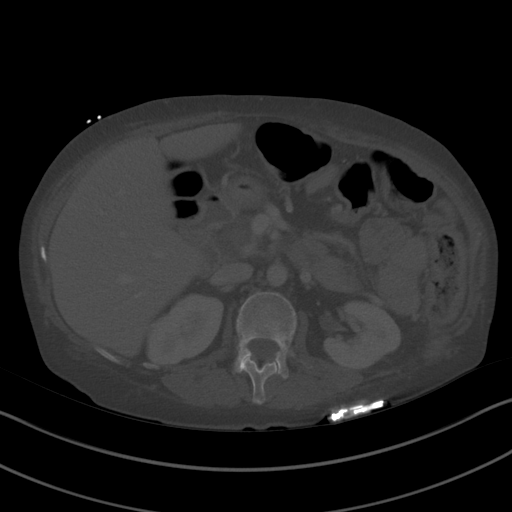
[im 75/97  soft-tissue]
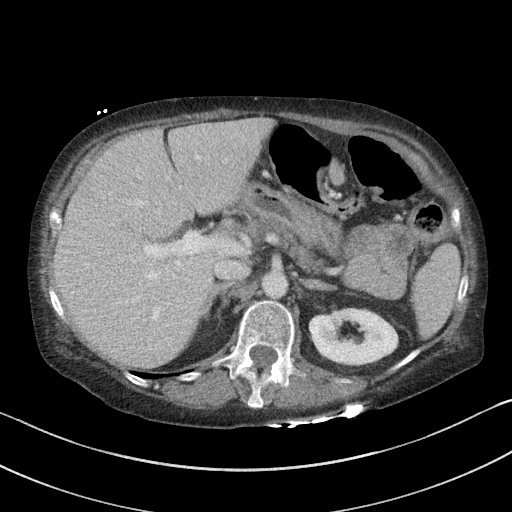
[im 81/97  soft-tissue]
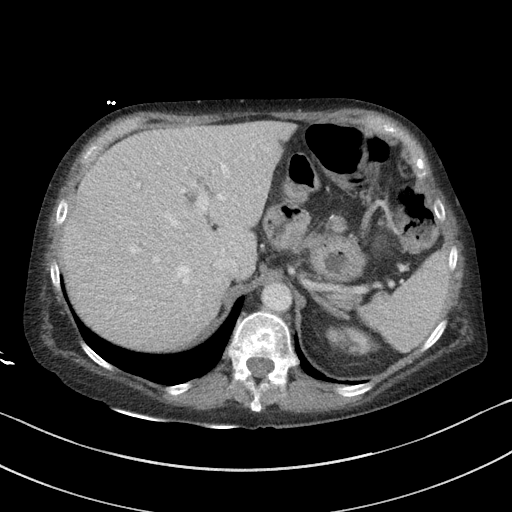
[im 91/97  soft-tissue]
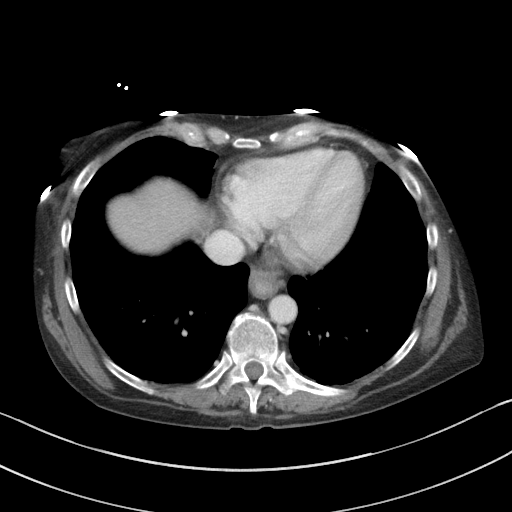

[Series 6: a/p w/ cor · coronal · 0.77mm/px · 3 of 127 slices shown]
[im 43/127  soft-tissue]
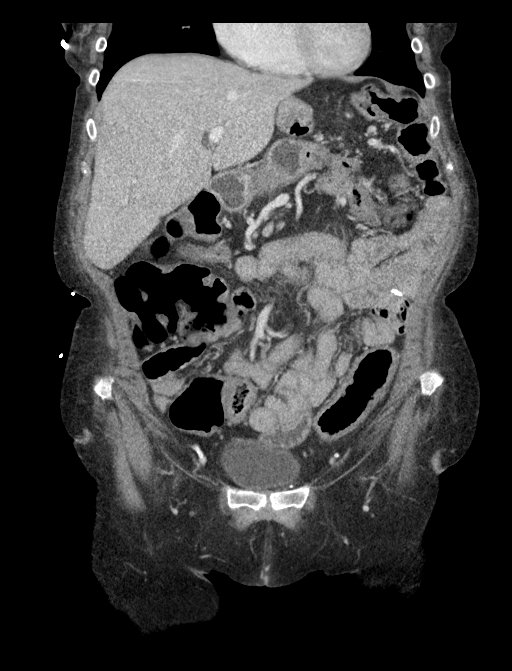
[im 57/127  soft-tissue]
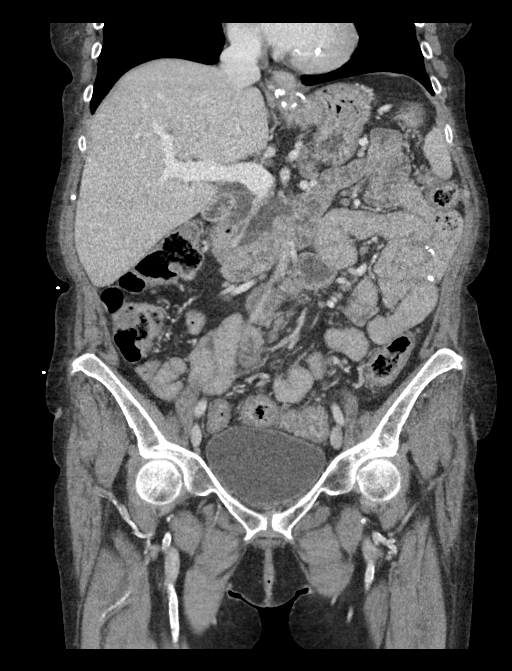
[im 71/127  soft-tissue]
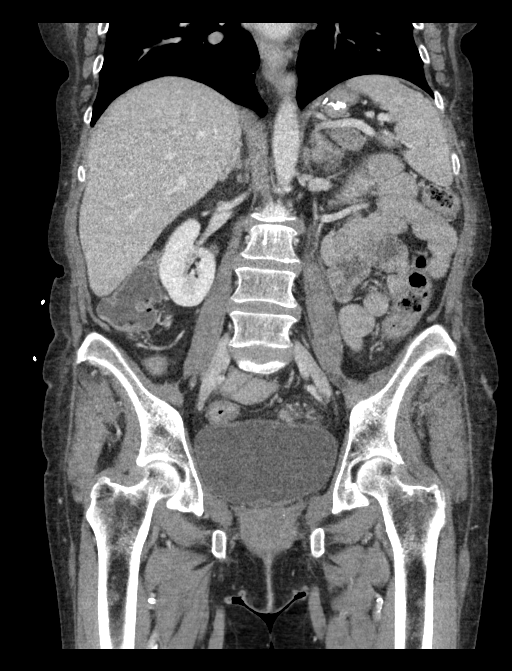

[15 of 46 positions shown; findings below may reference images not displayed]

FINDINGS: Lower chest: Minimal bibasilar atelectasis. The visualized lung
bases are otherwise clear. There is coronary vascular calcification.

No intra-abdominal free air or free fluid.

Hepatobiliary: The liver is unremarkable. There is mild intrahepatic
biliary duct dilatation, likely post cholecystectomy. No retained
calcified stone noted in the central CBD.

Pancreas: No acute findings.

Spleen: Normal in size without focal abnormality.

Adrenals/Urinary Tract: The adrenal glands are unremarkable. There
is a 6 mm focus of calcification or a nonobstructing stone in the
upper pole of the left kidney. There is mild cortical irregularity
and scarring of the upper pole of the left kidney. There is no
hydronephrosis on either side. There is symmetric enhancement and
excretion of contrast by both kidneys. The visualized ureters and
the urinary bladder are unremarkable.

Stomach/Bowel: There are scattered distal colonic diverticula
without active inflammatory changes. There is diffuse thickened
appearance of the distal colon most consistent with colitis.
Correlation with clinical exam and stool cultures recommended. There
is postsurgical changes of the bowel with anastomotic sutures. There
is a small hiatal hernia. There is no bowel obstruction. The
appendix is not visualized with certainty. No inflammatory changes
identified in the right lower quadrant.

Vascular/Lymphatic: Mild aortoiliac atherosclerotic disease. The IVC
is unremarkable. No portal venous gas. There is no adenopathy.

Reproductive: Hysterectomy. No adnexal masses.

Other: None

Musculoskeletal: Chronic appearing mild T12 compression fracture and
Schmorl's node of the superior endplate. No acute osseous pathology.
IMPRESSION: 1. Colitis of the distal colon. Correlation with clinical exam and
stool cultures recommended. No bowel obstruction.
2. Colonic diverticulosis.
3. A 6 mm focus of calcification in the upper pole of the left
kidney. No hydronephrosis.
4. Aortic Atherosclerosis ([Z8]-[Z8]).

## 2021-02-08 MED ORDER — ONDANSETRON HCL 4 MG/2ML IJ SOLN
4.0000 mg | Freq: Once | INTRAMUSCULAR | Status: AC
  Administered 2021-02-08: 4 mg via INTRAVENOUS
  Filled 2021-02-08: qty 2

## 2021-02-08 MED ORDER — MORPHINE SULFATE (PF) 4 MG/ML IV SOLN
6.0000 mg | Freq: Once | INTRAVENOUS | Status: AC
  Administered 2021-02-09: 6 mg via INTRAVENOUS
  Filled 2021-02-08: qty 2

## 2021-02-08 MED ORDER — SODIUM CHLORIDE 0.9 % IV BOLUS
1000.0000 mL | Freq: Once | INTRAVENOUS | Status: AC
  Administered 2021-02-08: 1000 mL via INTRAVENOUS

## 2021-02-08 MED ORDER — IOHEXOL 300 MG/ML  SOLN
100.0000 mL | Freq: Once | INTRAMUSCULAR | Status: AC | PRN
  Administered 2021-02-08: 100 mL via INTRAVENOUS

## 2021-02-08 MED ORDER — HYDROMORPHONE HCL 1 MG/ML IJ SOLN
1.0000 mg | Freq: Once | INTRAMUSCULAR | Status: AC
  Administered 2021-02-08: 1 mg via INTRAVENOUS
  Filled 2021-02-08: qty 1

## 2021-02-08 MED ORDER — SODIUM CHLORIDE 0.9 % IV SOLN
1.0000 g | Freq: Once | INTRAVENOUS | Status: AC
  Administered 2021-02-09: 1 g via INTRAVENOUS
  Filled 2021-02-08: qty 10

## 2021-02-08 MED ORDER — ENOXAPARIN SODIUM 40 MG/0.4ML IJ SOSY
40.0000 mg | PREFILLED_SYRINGE | INTRAMUSCULAR | Status: DC
  Administered 2021-02-09: 40 mg via SUBCUTANEOUS
  Filled 2021-02-08: qty 0.4

## 2021-02-08 MED ORDER — METRONIDAZOLE 500 MG/100ML IV SOLN
500.0000 mg | Freq: Three times a day (TID) | INTRAVENOUS | Status: DC
  Administered 2021-02-09 – 2021-02-10 (×5): 500 mg via INTRAVENOUS
  Filled 2021-02-08 (×5): qty 100

## 2021-02-08 MED ORDER — SODIUM CHLORIDE 0.9 % IV SOLN: 2.0000 g | INTRAVENOUS | Status: DC

## 2021-02-08 MED ORDER — SODIUM CHLORIDE 0.9 % IV SOLN
1.0000 g | Freq: Once | INTRAVENOUS | Status: AC
  Administered 2021-02-08: 1 g via INTRAVENOUS
  Filled 2021-02-08: qty 10

## 2021-02-08 MED ORDER — SODIUM CHLORIDE 0.9 % IV SOLN: INTRAVENOUS | Status: DC

## 2021-02-08 NOTE — Telephone Encounter (Signed)
Patient is currently in the ED

## 2021-02-08 NOTE — Telephone Encounter (Signed)
Pt states that she had fever of 105 last night. She wants to know if she needs to repeat bloodwork. Pls call her.

## 2021-02-08 NOTE — Telephone Encounter (Signed)
Spoke with patient and advised that she needed to go to the ED for evaluation. Patient did not sound well on the phone. Advised that if she is admitted we have providers at the hospital. Patient verbalized understanding and had no concerns at the end of the call.

## 2021-02-08 NOTE — H&P (Signed)
History and Physical  Amy Hansen RXV:[id number] DOB: Nov 26, 1955 DOA: 02/08/2021  Referring physician: Dr. Kathrynn Humble, [place]. PCP: [name], NP  Outpatient Specialists: GI Patient coming from: Home.  Chief Complaint: Diffuse abdominal discomfort.  HPI: Amy Hansen is a 65 y.o. female with medical history significant for Crohn's disease, type 2 diabetes, chronic anxiety/depression, iron deficiency anemia, GERD who presented to [facility] ED due to persistent abdominal pain, gradually worsening in the last week.  Associated with intermittent nausea, vomiting, diarrhea, intermittent blood in the stool, fever 105 the night prior to presentation.  Patient is on prednisone taper.  Reports symptoms onset in October 2021 intermittently, on prednisone taper off and on, no worsening or improving factors.  She follows with GI [place].  She was started on p.o. vancomycin and prednisone taper 4 days ago.  She presented to the ED for further evaluation.  CT abdomen and pelvis showed colitis to the distal colon.  Stool cultures recommended.  No bowel obstruction.  A 6 mm focus of calcification in the upper pole of the left kidney no hydronephrosis.  Aortic atherosclerosis.  ED Course:  Temperature 98.2.  BP 127/85, pulse 83, respiratory rate 24 O2 saturation 97%.  Lab studies remarkable for serum sodium 136, potassium 3.5, serum bicarb 21, serum glucose 453, BUN 5, creatinine 0.70, calcium 8.3, alkaline phosphatase 130, albumin 2.3, AST 9, ALT 11, GFR greater than 60.  Lactic acid 3.2, 0.9.  WBC 16.3, hemoglobin 12.0, platelet count 639.  UA positive for glycosuria and pyuria.  Review of Systems: Review of systems as noted in the HPI. All other systems reviewed and are negative.   Past Medical History:  Diagnosis Date   Anemia    Anxiety    Chronic kidney disease    UTI, hematuria in urine   Colitis    Crohn's disease ([place])    Depression    Diabetes ([place])    Diverticulosis    Frequent headaches     Interstitial cystitis    Recurrent UTI    Restless leg syndrome    Urinary frequency    Past Surgical History:  Procedure Laterality Date   ABDOMINAL HYSTERECTOMY     bariatric bypass  2012   BIOPSY  05/03/2020   Procedure: BIOPSY;  Surgeon: Milus Banister, MD;  Location: [facility] ENDOSCOPY;  Service: Endoscopy;;   CARPAL TUNNEL RELEASE Right 2003   CARPAL TUNNEL RELEASE Right    2008   CHOLECYSTECTOMY  1975   COLONOSCOPY WITH PROPOFOL N/A 05/03/2020   Procedure: COLONOSCOPY WITH PROPOFOL;  Surgeon: Milus Banister, MD;  Location: [facility] ENDOSCOPY;  Service: Endoscopy;  Laterality: N/A;   CYSTOSCOPY W/ RETROGRADES Bilateral 06/06/2015   Procedure: CYSTOSCOPY WITH RETROGRADE PYELOGRAM;  Surgeon: Festus Aloe, MD;  Location: [facility] ORS;  Service: Urology;  Laterality: Bilateral;   FL INJ LEFT KNEE CT ARTHROGRAM ([facility] HX) Left    1995   GASTRIC BYPASS  2010   HEMORRHOID SURGERY  2013   KNEE ARTHROSCOPY Left 1996   TONSILLECTOMY      Social History:  reports that she quit smoking about 45 years ago. Her smoking use included cigarettes. She smoked an average of .5 packs per day. She has never used smokeless tobacco. She reports that she does not drink alcohol and does not use drugs.   Allergies  Allergen Reactions   Avelox [Moxifloxacin Hcl In Nacl] Anaphylaxis   Bactrim [Sulfamethoxazole-Trimethoprim] Anaphylaxis   Ciprofloxacin Other (See Comments)    Pt states she was told  never to take this as it is in the same family as Avelox.    Depakote [Divalproex Sodium]    Imitrex [Sumatriptan] Other (See Comments)    Neck and shoulder pain   Stadol [Butorphanol] Rash    Family History  Problem Relation Age of Onset   Stroke Father    Colon cancer Mother    Heart failure Sister    Bladder Cancer Neg Hx    Kidney disease Neg Hx    Prostate cancer Neg Hx    Kidney cancer Neg Hx    Pancreatic cancer Neg Hx    Esophageal cancer Neg Hx    Stomach cancer Neg Hx    Rectal cancer Neg Hx        Prior to Admission medications   Medication Sig Start Date End Date Taking? Authorizing Provider  ALPRAZolam Duanne Moron) 0.5 MG tablet TAKE 1 TABLET BY MOUTH 4 TIMES DAILY AS NEEDED FOR ANXIETY 10/19/20   Donnal Moat T, PA-C  buPROPion (WELLBUTRIN XL) 150 MG 24 hr tablet Take 3 tablets by mouth once daily 09/29/20   Addison Lank, PA-C  Cholecalciferol (VITAMIN D) 50 MCG (2000 UT) CAPS Take 1 capsule (2,000 Units total) by mouth daily. 09/02/20   Armbruster, Carlota Raspberry, MD  estradiol (ESTRACE) 0.1 MG/GM vaginal cream Apply one pea-sized amount around the opening of the urethra three times weekly. 06/29/20   Vaillancourt, Aldona Bar, PA-C  ferrous sulfate 325 (65 FE) MG EC tablet Take 1 tablet (325 mg total) by mouth 2 (two) times daily. 04/24/20 04/24/21  Lorella Nimrod, MD  furosemide (LASIX) 20 MG tablet Take 1 tablet (72m) daily. Take an additional tablet (29m AS NEEDED. 12/27/20   WaLoel DubonnetNP  furosemide (LASIX) 20 MG tablet Take 2 tablets (4012mdaily x 2 weeks, then take 1 tablet (54m88maily. Take an additional 54mg55mlet AS NEEDED. 12/27/20 01/26/21  WalkeLoel Dubonnet glipiZIDE (GLUCOTROL) 5 MG tablet TAKE 1 TABLET BY MOUTH TWICE DAILY WITH MEALS (APPOINTMENT  REQUIRED  FOR  FUTURE  REFILLS) 11/05/20   HoldsJon Billings insulin glargine, 2 Unit Dial, (TOUJEO MAX SOLOSTAR) 300 UNIT/ML Solostar Pen Inject 24 units daily. Titrate as instructed. Max daily dose 40 units 10/27/20   HoldsJon Billings Insulin Pen Needle (PEN NEEDLES) 32G X 4 MM MISC 1 Units by Does not apply route at bedtime. 12/20/19   Lane,Volney AmericanC  lamoTRIgine (LAMICTAL) 200 MG tablet Take 2 tablets (400 mg total) by mouth at bedtime. 09/02/20   HurstDonnal MoatA-C  loperamide (IMODIUM) 2 MG capsule Take 1 capsule (2 mg total) by mouth as needed for diarrhea or loose stools. 04/24/20   Amin,Lorella Nimrod mesalamine (LIALDA) 1.2 g EC tablet Take 4.8 g by mouth daily with breakfast. 09/02/20   Armbruster,  SteveCarlota Raspberry metFORMIN (GLUCOPHAGE) 1000 MG tablet TAKE 1 TABLET BY MOUTH TWICE DAILY WITH MEALS (APPOINTMENT  REQUIRED  FOR  FUTURE  REFILLS) 01/04/21   HoldsJon Billings nystatin (MYCOSTATIN/NYSTOP) powder Apply 1 application topically 3 (three) times daily. Apply to lower abdominal fold tid x 2 to 3 weeks 07/12/20   KenneNoralyn Pick ondansetron (ZOFRAN-ODT) 4 MG disintegrating tablet DISSOLVE 1 TABLET IN MOUTH EVERY 6 HOURS AS NEEDED FOR NAUSEA 02/02/21   Armbruster, SteveCarlota Raspberry oxyCODONE-acetaminophen (PERCOCET) 5-325 MG tablet Take 1 tablet by mouth every 4 (four) hours as needed for severe pain. 01/06/21 01/06/22  SmithTamala Julian  Hinda Lenis, PA-C  pantoprazole (PROTONIX) 40 MG tablet Take 1 tablet (40 mg total) by mouth daily. 05/30/20   Noralyn Pick, NP  potassium chloride (KLOR-CON) 10 MEQ tablet Take 1 tablet (10 mEq total) by mouth daily for 14 days. 12/27/20 01/10/21  Loel Dubonnet, NP  pramipexole (MIRAPEX) 1 MG tablet Take 1 tablet (1 mg total) by mouth at bedtime. 11/16/20   Cannady, Henrine Screws T, NP  predniSONE (DELTASONE) 10 MG tablet Take 4 tablets (40 mg total) by mouth daily with breakfast for 14 days, THEN 3.5 tablets (35 mg total) daily with breakfast for 7 days, THEN 3 tablets (30 mg total) daily with breakfast for 7 days, THEN 2.5 tablets (25 mg total) daily with breakfast for 7 days, THEN 2 tablets (20 mg total) daily with breakfast for 7 days, THEN 1.5 tablets (15 mg total) daily with breakfast for 7 days, THEN 1 tablet (10 mg total) daily with breakfast for 7 days, THEN 0.5 tablets (5 mg total) daily with breakfast for 7 days. 02/02/21 04/06/21  Armbruster, Carlota Raspberry, MD  risperiDONE (RISPERDAL) 3 MG tablet Take 1 tablet (3 mg total) by mouth at bedtime. 09/08/20   Donnal Moat T, PA-C  sertraline (ZOLOFT) 100 MG tablet TAKE 1 & 1/2 (ONE & ONE-HALF) TABLETS BY MOUTH ONCE DAILY 09/02/20   Addison Lank, PA-C  Turmeric, Lear Ng, [place]) POWD Take as directed 10/25/20    Armbruster, Carlota Raspberry, MD  vancomycin (VANCOCIN) 125 MG capsule Take 1 capsule (125 mg total) by mouth 4 (four) times daily for 10 days. 02/02/21 02/12/21  Yetta Flock, MD    Physical Exam: BP 130/74   Pulse 82   Temp 98.1 F (36.7 C) (Oral)   Resp 15   LMP  (LMP Unknown)   SpO2 97%   General: 65 y.o. year-old female well developed well nourished in no acute distress.  Alert and oriented x3. Cardiovascular: Regular rate and rhythm with no rubs or gallops.  No thyromegaly or JVD noted.  No lower extremity edema. 2/4 pulses in all 4 extremities. Respiratory: Clear to auscultation with no wheezes or rales. Good inspiratory effort. Abdomen: Soft lower quadrant abdominal tenderness, nondistended with normal bowel sounds x4 quadrants. Muskuloskeletal: No cyanosis, clubbing or edema noted bilaterally Neuro: CN II-XII intact, strength, sensation, reflexes Skin: No ulcerative lesions noted or rashes Psychiatry: Judgement and insight appear normal. Mood is appropriate for condition and setting          Labs on Admission:  Basic Metabolic Panel: Recent Labs  Lab 02/08/21 1253  NA 126*  K 3.5  CL 93*  CO2 21*  GLUCOSE 453*  BUN 5*  CREATININE 0.70  CALCIUM 8.3*   Liver Function Tests: Recent Labs  Lab 02/08/21 1253  AST 9*  ALT 11  ALKPHOS 130*  BILITOT 0.5  PROT 6.5  ALBUMIN 2.3*   Recent Labs  Lab 02/08/21 1253  LIPASE 21   No results for input(s): AMMONIA in the last 168 hours. CBC: Recent Labs  Lab 02/02/21 1201 02/08/21 1253  WBC 17.6* 16.3*  NEUTROABS 15.5*  --   HGB 10.9* 12.0  HCT 33.9* 38.0  MCV 84.5 85.0  PLT 576.0* 639*   Cardiac Enzymes: No results for input(s): CKTOTAL, CKMB, CKMBINDEX, TROPONINI in the last 168 hours.  BNP (last 3 results) Recent Labs    05/26/20 0907 12/27/20 0929  BNP 55.0 60.6    ProBNP (last 3 results) No results for input(s): PROBNP in the  last 8760 hours.  CBG: No results for input(s): GLUCAP in the last  168 hours.  Radiological Exams on Admission: CT ABDOMEN PELVIS W CONTRAST  Result Date: 02/08/2021 CLINICAL DATA:  65 year old female with abdominal pain and fever. EXAM: CT ABDOMEN AND PELVIS WITH CONTRAST TECHNIQUE: Multidetector CT imaging of the abdomen and pelvis was performed using the standard protocol following bolus administration of intravenous contrast. CONTRAST:  168m OMNIPAQUE IOHEXOL 300 MG/ML  SOLN COMPARISON:  CT abdomen pelvis dated 04/21/2020. FINDINGS: Lower chest: Minimal bibasilar atelectasis. The visualized lung bases are otherwise clear. There is coronary vascular calcification. No intra-abdominal free air or free fluid. Hepatobiliary: The liver is unremarkable. There is mild intrahepatic biliary duct dilatation, likely post cholecystectomy. No retained calcified stone noted in the central CBD. Pancreas: No acute findings. Spleen: Normal in size without focal abnormality. Adrenals/Urinary Tract: The adrenal glands are unremarkable. There is a 6 mm focus of calcification or a nonobstructing stone in the upper pole of the left kidney. There is mild cortical irregularity and scarring of the upper pole of the left kidney. There is no hydronephrosis on either side. There is symmetric enhancement and excretion of contrast by both kidneys. The visualized ureters and the urinary bladder are unremarkable. Stomach/Bowel: There are scattered distal colonic diverticula without active inflammatory changes. There is diffuse thickened appearance of the distal colon most consistent with colitis. Correlation with clinical exam and stool cultures recommended. There is postsurgical changes of the bowel with anastomotic sutures. There is a small hiatal hernia. There is no bowel obstruction. The appendix is not visualized with certainty. No inflammatory changes identified in the right lower quadrant. Vascular/Lymphatic: Mild aortoiliac atherosclerotic disease. The IVC is unremarkable. No portal venous gas.  There is no adenopathy. Reproductive: Hysterectomy. No adnexal masses. Other: None Musculoskeletal: Chronic appearing mild T12 compression fracture and Schmorl's node of the superior endplate. No acute osseous pathology. IMPRESSION: 1. Colitis of the distal colon. Correlation with clinical exam and stool cultures recommended. No bowel obstruction. 2. Colonic diverticulosis. 3. A 6 mm focus of calcification in the upper pole of the left kidney. No hydronephrosis. 4. Aortic Atherosclerosis (ICD10-I70.0). Electronically Signed   By: AAnner CreteM.D.   On: 02/08/2021 21:17    EKG: I independently viewed the EKG done and my findings are as followed: None available at time of this visit.  Assessment/Plan Present on Admission:  Colitis  Active Problems:   Colitis  Crohn's colitis Presented with worsening abdominal pain, nausea, vomiting, diarrhea, fever of 105 last night, intermittent blood in the stool Followed by GI [place] On prednisone taper prior to admission, switched to Solu-Medrol IV 40 mg daily. Resume home regimen GI consult Continue IV antibiotic started in the ED, Rocephin and IV Flagyl.  Diarrhea in the setting of Crohn's colitis Obtain C. difficile PCR and GI panel by PCR Can add As Needed Imodium if C. difficile ruled out She was started on p.o. vancomycin outpatient x10 days with end date on 02/16/2021.  Hypovolemic hyponatremia Serum sodium 126 Normal saline at 75 cc/h Repeat BMP in the morning  Type 2 diabetes with hyperglycemia Likely exacerbated by steroids Obtain A1c Insulin sliding scale  Moderate protein calorie malnutrition Albumin 2.3 Moderate muscle mass loss Dietary consult  Isolated elevated alkaline phosphatase, nonspecific Monitor for now  Restless leg syndrome Resume home regimen  Chronic anxiety/depression Resume home regimen  Pyuria, asymptomatic Leukocyte esterase small, nitrite positive, WBC 6-10. Obtain urine culture Follow for ID  and sensitivities.   DVT  prophylaxis: Subcu Lovenox daily.  Code Status: Full code  Family Communication: Husband at bedside  Disposition Plan: Admitted to telemetry medical.  Consults called: GI consulted by EDP.  Admission status: Inpatient status.  Patient will require at least 2 midnights for further evaluation and treatment of present condition.   Status is: Inpatient   Dispo:  Patient From: Home  Planned Disposition: Home  Medically stable for discharge: No         Kayleen Memos MD [facility] Pager [phone]  If 7PM-7AM, please contact night-coverage [email or web address] Password [facility]  02/08/2021, 11:03 PM

## 2021-02-08 NOTE — ED Triage Notes (Signed)
Patient complains of ongoing lower abdominal pain with nausea/vomiting for 1 week. Her MD told her colitis and just started antibiotics. Fever again this am.

## 2021-02-08 NOTE — Telephone Encounter (Signed)
Sorry to hear this.  She just submitted her C. difficile PCR this morning (first study was canceled).  She should be on steroids and oral vancomycin at this time.  If she is having fever to 105 and feeling worse despite this regimen I agree she should probably go to the emergency room for blood work and further evaluation as appropriate.

## 2021-02-08 NOTE — ED Provider Notes (Signed)
[facility] EMERGENCY DEPARTMENT Provider Note   CSN: [id number] Arrival date & time: 02/08/21  1238     History No chief complaint on file.   Amy Hansen is a 65 y.o. female.  HPI     Amy Hansen is a 65 y.o. female, with a history of anemia, anxiety, Crohn's disease, diabetes, recurrent UTIs, presenting to the ED with abdominal pain for the last week. She endorses waxing and waning pain throughout the abdomen.  Sharper on the right, more dull on the left.  Currently rates her pain 5/10.  Also endorses nausea, vomiting, and diarrhea with multiple stools a day for the past several days.  Fever up to 105 F. Also notes dysuria and urinary frequency for several days. She states she was communicating with Dr. Havery Moros via phone.  There was suspicion on their part for possible C. difficile.  She was started on a long taper of prednisone last week and started on vancomycin orally 2 days ago. She denies any improvement.  Denies any recent antibiotics.  Denies chest pain, shortness of breath, flank/back pain, hematochezia/melena, or any other complaints.     Past Medical History:  Diagnosis Date   Anemia    Anxiety    Chronic kidney disease    UTI, hematuria in urine   Colitis    Crohn's disease ([place])    Depression    Diabetes ([place])    Diverticulosis    Frequent headaches    Interstitial cystitis    Recurrent UTI    Restless leg syndrome    Urinary frequency     Patient Active Problem List   Diagnosis Date Noted   Abnormal CT scan, gastrointestinal tract    BRBPR (bright red blood per rectum) 05/01/2020   Weakness 04/25/2020   Pancolitis ([place]) 04/25/2020   Type 2 diabetes mellitus ([place]) 04/25/2020   Colitis 04/21/2020   AKI (acute kidney injury) ([place]) 04/21/2020   Major depression, chronic 06/01/2018   OCD (obsessive compulsive disorder) 06/01/2018   Chronic heart failure with preserved ejection fraction ([place]) 05/16/2018   Left leg pain  11/04/2017   GERD (gastroesophageal reflux disease) 02/18/2017   Iron deficiency anemia 10/18/2016   Leg swelling 07/17/2016   Hypertension 03/21/2016   Vitamin D deficiency 02/21/2016   B12 deficiency 02/21/2016   Poorly controlled type 2 diabetes mellitus ([place]) 09/13/2015   Migraines 05/23/2015   Restless legs syndrome (RLS) 05/23/2015   Recurrent UTI 04/26/2015   Atrophic vaginitis 04/26/2015   Dyspareunia, female 04/26/2015    Past Surgical History:  Procedure Laterality Date   ABDOMINAL HYSTERECTOMY     bariatric bypass  2012   BIOPSY  05/03/2020   Procedure: BIOPSY;  Surgeon: Milus Banister, MD;  Location: [facility] ENDOSCOPY;  Service: Endoscopy;;   CARPAL TUNNEL RELEASE Right 2003   CARPAL TUNNEL RELEASE Right    2008   CHOLECYSTECTOMY  1975   COLONOSCOPY WITH PROPOFOL N/A 05/03/2020   Procedure: COLONOSCOPY WITH PROPOFOL;  Surgeon: Milus Banister, MD;  Location: [facility] ENDOSCOPY;  Service: Endoscopy;  Laterality: N/A;   CYSTOSCOPY W/ RETROGRADES Bilateral 06/06/2015   Procedure: CYSTOSCOPY WITH RETROGRADE PYELOGRAM;  Surgeon: Festus Aloe, MD;  Location: [facility] ORS;  Service: Urology;  Laterality: Bilateral;   FL INJ LEFT KNEE CT ARTHROGRAM ([facility] HX) Left    1995   GASTRIC BYPASS  2010   HEMORRHOID SURGERY  2013   KNEE ARTHROSCOPY Left 1996   TONSILLECTOMY       OB  History   No obstetric history on file.     Family History  Problem Relation Age of Onset   Stroke Father    Colon cancer Mother    Heart failure Sister    Bladder Cancer Neg Hx    Kidney disease Neg Hx    Prostate cancer Neg Hx    Kidney cancer Neg Hx    Pancreatic cancer Neg Hx    Esophageal cancer Neg Hx    Stomach cancer Neg Hx    Rectal cancer Neg Hx     Social History   Tobacco Use   Smoking status: Former    Packs/day: 0.50    Pack years: 0.00    Types: Cigarettes    Quit date: 04/25/1975    Years since quitting: 45.8   Smokeless tobacco: Never   Tobacco comments:    quit 40 years   Vaping Use   Vaping Use: Never used  Substance Use Topics   Alcohol use: No    Alcohol/week: 0.0 standard drinks   Drug use: No    Home Medications Prior to Admission medications   Medication Sig Start Date End Date Taking? Authorizing Provider  ALPRAZolam Duanne Moron) 0.5 MG tablet TAKE 1 TABLET BY MOUTH 4 TIMES DAILY AS NEEDED FOR ANXIETY 10/19/20   Donnal Moat T, PA-C  buPROPion (WELLBUTRIN XL) 150 MG 24 hr tablet Take 3 tablets by mouth once daily 09/29/20   Addison Lank, PA-C  Cholecalciferol (VITAMIN D) 50 MCG (2000 UT) CAPS Take 1 capsule (2,000 Units total) by mouth daily. 09/02/20   Armbruster, Carlota Raspberry, MD  estradiol (ESTRACE) 0.1 MG/GM vaginal cream Apply one pea-sized amount around the opening of the urethra three times weekly. 06/29/20   Vaillancourt, Aldona Bar, PA-C  ferrous sulfate 325 (65 FE) MG EC tablet Take 1 tablet (325 mg total) by mouth 2 (two) times daily. 04/24/20 04/24/21  Lorella Nimrod, MD  furosemide (LASIX) 20 MG tablet Take 1 tablet (75m) daily. Take an additional tablet (267m AS NEEDED. 12/27/20   WaLoel DubonnetNP  furosemide (LASIX) 20 MG tablet Take 2 tablets (4057mdaily x 2 weeks, then take 1 tablet (23m18maily. Take an additional 23mg64mlet AS NEEDED. 12/27/20 01/26/21  WalkeLoel Dubonnet glipiZIDE (GLUCOTROL) 5 MG tablet TAKE 1 TABLET BY MOUTH TWICE DAILY WITH MEALS (APPOINTMENT  REQUIRED  FOR  FUTURE  REFILLS) 11/05/20   HoldsJon Billings insulin glargine, 2 Unit Dial, (TOUJEO MAX SOLOSTAR) 300 UNIT/ML Solostar Pen Inject 24 units daily. Titrate as instructed. Max daily dose 40 units 10/27/20   HoldsJon Billings Insulin Pen Needle (PEN NEEDLES) 32G X 4 MM MISC 1 Units by Does not apply route at bedtime. 12/20/19   Lane,Volney AmericanC  lamoTRIgine (LAMICTAL) 200 MG tablet Take 2 tablets (400 mg total) by mouth at bedtime. 09/02/20   HurstDonnal MoatA-C  loperamide (IMODIUM) 2 MG capsule Take 1 capsule (2 mg total) by mouth as needed for  diarrhea or loose stools. 04/24/20   Amin,Lorella Nimrod mesalamine (LIALDA) 1.2 g EC tablet Take 4.8 g by mouth daily with breakfast. 09/02/20   Armbruster, SteveCarlota Raspberry metFORMIN (GLUCOPHAGE) 1000 MG tablet TAKE 1 TABLET BY MOUTH TWICE DAILY WITH MEALS (APPOINTMENT  REQUIRED  FOR  FUTURE  REFILLS) 01/04/21   HoldsJon Billings nystatin (MYCOSTATIN/NYSTOP) powder Apply 1 application topically 3 (three) times daily. Apply to lower abdominal fold tid x 2 to 3  weeks 07/12/20   Noralyn Pick, NP  ondansetron (ZOFRAN-ODT) 4 MG disintegrating tablet DISSOLVE 1 TABLET IN MOUTH EVERY 6 HOURS AS NEEDED FOR NAUSEA 02/02/21   Armbruster, Carlota Raspberry, MD  oxyCODONE-acetaminophen (PERCOCET) 5-325 MG tablet Take 1 tablet by mouth every 4 (four) hours as needed for severe pain. 01/06/21 01/06/22  Sable Feil, PA-C  pantoprazole (PROTONIX) 40 MG tablet Take 1 tablet (40 mg total) by mouth daily. 05/30/20   Noralyn Pick, NP  potassium chloride (KLOR-CON) 10 MEQ tablet Take 1 tablet (10 mEq total) by mouth daily for 14 days. 12/27/20 01/10/21  Loel Dubonnet, NP  pramipexole (MIRAPEX) 1 MG tablet Take 1 tablet (1 mg total) by mouth at bedtime. 11/16/20   Cannady, Henrine Screws T, NP  predniSONE (DELTASONE) 10 MG tablet Take 4 tablets (40 mg total) by mouth daily with breakfast for 14 days, THEN 3.5 tablets (35 mg total) daily with breakfast for 7 days, THEN 3 tablets (30 mg total) daily with breakfast for 7 days, THEN 2.5 tablets (25 mg total) daily with breakfast for 7 days, THEN 2 tablets (20 mg total) daily with breakfast for 7 days, THEN 1.5 tablets (15 mg total) daily with breakfast for 7 days, THEN 1 tablet (10 mg total) daily with breakfast for 7 days, THEN 0.5 tablets (5 mg total) daily with breakfast for 7 days. 02/02/21 04/06/21  Armbruster, Carlota Raspberry, MD  risperiDONE (RISPERDAL) 3 MG tablet Take 1 tablet (3 mg total) by mouth at bedtime. 09/08/20   Donnal Moat T, PA-C  sertraline (ZOLOFT) 100 MG tablet  TAKE 1 & 1/2 (ONE & ONE-HALF) TABLETS BY MOUTH ONCE DAILY 09/02/20   Addison Lank, PA-C  Turmeric, Lear Ng, [place]) POWD Take as directed 10/25/20   Armbruster, Carlota Raspberry, MD  vancomycin (VANCOCIN) 125 MG capsule Take 1 capsule (125 mg total) by mouth 4 (four) times daily for 10 days. 02/02/21 02/12/21  Yetta Flock, MD    Allergies    Avelox [moxifloxacin hcl in nacl], Bactrim [sulfamethoxazole-trimethoprim], Ciprofloxacin, Depakote [divalproex sodium], Imitrex [sumatriptan], and Stadol [butorphanol]  Review of Systems   Review of Systems  Constitutional:  Positive for fatigue and fever.  Respiratory:  Negative for cough and shortness of breath.   Cardiovascular:  Negative for chest pain.  Gastrointestinal:  Positive for abdominal pain, diarrhea, nausea and vomiting. Negative for blood in stool.  Genitourinary:  Positive for dysuria and frequency. Negative for flank pain.  Neurological:  Positive for weakness.  All other systems reviewed and are negative.  Physical Exam Updated Vital Signs BP 121/75 (BP Location: Right Arm)   Pulse 98   Temp 98.1 F (36.7 C) (Oral)   Resp 16   LMP  (LMP Unknown)   SpO2 97%   Physical Exam Vitals and nursing note reviewed.  Constitutional:      General: She is not in acute distress.    Appearance: She is well-developed. She is not diaphoretic.  HENT:     Head: Normocephalic and atraumatic.     Mouth/Throat:     Mouth: Mucous membranes are moist.     Pharynx: Oropharynx is clear.  Eyes:     Conjunctiva/sclera: Conjunctivae normal.  Cardiovascular:     Rate and Rhythm: Normal rate and regular rhythm.     Pulses: Normal pulses.          Radial pulses are 2+ on the right side and 2+ on the left side.     Heart sounds: Normal  heart sounds.  Pulmonary:     Effort: Pulmonary effort is normal. No respiratory distress.     Breath sounds: Normal breath sounds.  Abdominal:     Palpations: Abdomen is soft.     Tenderness: There is  generalized abdominal tenderness. There is no guarding.  Musculoskeletal:     Cervical back: Neck supple.     Right lower leg: No edema.     Left lower leg: No edema.  Skin:    General: Skin is warm and dry.  Neurological:     Mental Status: She is alert.  Psychiatric:        Mood and Affect: Mood and affect normal.        Speech: Speech normal.        Behavior: Behavior normal.    ED Results / Procedures / Treatments   Labs (all labs ordered are listed, but only abnormal results are displayed) Labs Reviewed  COMPREHENSIVE METABOLIC PANEL - Abnormal; Notable for the following components:      Result Value   Sodium 126 (*)    Chloride 93 (*)    CO2 21 (*)    Glucose, Bld 453 (*)    BUN 5 (*)    Calcium 8.3 (*)    Albumin 2.3 (*)    AST 9 (*)    Alkaline Phosphatase 130 (*)    All other components within normal limits  CBC - Abnormal; Notable for the following components:   WBC 16.3 (*)    Platelets 639 (*)    All other components within normal limits  URINALYSIS, ROUTINE W REFLEX MICROSCOPIC - Abnormal; Notable for the following components:   APPearance HAZY (*)    Glucose, UA >=500 (*)    Ketones, ur 5 (*)    Nitrite POSITIVE (*)    Leukocytes,Ua SMALL (*)    Bacteria, UA RARE (*)    All other components within normal limits  URINE CULTURE  GASTROINTESTINAL PANEL BY PCR, STOOL (REPLACES STOOL CULTURE)  C DIFFICILE QUICK SCREEN W PCR REFLEX    CULTURE, BLOOD (ROUTINE X 2)  CULTURE, BLOOD (ROUTINE X 2)  LIPASE, BLOOD  LACTIC ACID, PLASMA  LACTIC ACID, PLASMA    EKG None  Radiology No results found.  Procedures Procedures   Medications Ordered in ED Medications  cefTRIAXone (ROCEPHIN) 1 g in sodium chloride 0.9 % 100 mL IVPB (has no administration in time range)  HYDROmorphone (DILAUDID) injection 1 mg (1 mg Intravenous Given 02/08/21 1902)  ondansetron (ZOFRAN) injection 4 mg (4 mg Intravenous Given 02/08/21 1900)  sodium chloride 0.9 % bolus 1,000 mL  (1,000 mLs Intravenous New Bag/Given 02/08/21 1859)    ED Course  I have reviewed the triage vital signs and the nursing notes.  Pertinent labs & imaging results that were available during my care of the patient were reviewed by me and considered in my medical decision making (see chart for details).    MDM Rules/Calculators/A&P                          Patient presents with abdominal pain, nausea, vomiting, diarrhea. Patient is nontoxic appearing, afebrile, not tachycardic on my exam, not tachypneic, not hypotensive, maintains excellent SPO2 on room air.  I have reviewed the patient's chart to obtain more information.   I reviewed and interpreted the patient's available labs. She does have a leukocytosis, however, it should be noted she is currently on prednisone. UA with nitrites,  leukocytes, rare bacteria, combined with urinary symptoms, we do think it prudent to initiate treatment on this, especially since we have no definitive prove of current C. difficile infection and she has not had any recent antibiotics.  C. difficile samples were negative in May 2022 and September 2021.  A sample was submitted for testing July 8, but testing was canceled due to the lab receiving a frozen sample.   End of shift patient care handoff report given to Varney Biles, MD. Plan: Pending abdominal CT.  Disposition accordingly.  Continue to treat for UTI?  Final Clinical Impression(s) / ED Diagnoses Final diagnoses:  None    Rx / DC Orders ED Discharge Orders     None        Layla Maw 02/08/21 1936    Varney Biles, MD 02/08/21 2251

## 2021-02-09 ENCOUNTER — Encounter (HOSPITAL_COMMUNITY): Payer: Self-pay | Admitting: Internal Medicine

## 2021-02-09 DIAGNOSIS — R197 Diarrhea, unspecified: Secondary | ICD-10-CM

## 2021-02-09 DIAGNOSIS — E43 Unspecified severe protein-calorie malnutrition: Secondary | ICD-10-CM | POA: Insufficient documentation

## 2021-02-09 DIAGNOSIS — E876 Hypokalemia: Secondary | ICD-10-CM

## 2021-02-09 DIAGNOSIS — A09 Infectious gastroenteritis and colitis, unspecified: Secondary | ICD-10-CM

## 2021-02-09 DIAGNOSIS — K50118 Crohn's disease of large intestine with other complication: Secondary | ICD-10-CM

## 2021-02-09 DIAGNOSIS — F419 Anxiety disorder, unspecified: Secondary | ICD-10-CM

## 2021-02-09 LAB — BASIC METABOLIC PANEL
Anion gap: 6 (ref 5–15)
BUN: <5 mg/dL — ABNORMAL LOW (ref 8–23)
CO2: 25 mmol/L (ref 22–32)
Calcium: 7.9 mg/dL — ABNORMAL LOW (ref 8.9–10.3)
Chloride: 104 mmol/L (ref 98–111)
Creatinine, Ser: 0.51 mg/dL (ref 0.44–1.00)
GFR, Estimated: >60 mL/min (ref >60)
Glucose, Bld: 221 mg/dL — ABNORMAL HIGH (ref 70–99)
Potassium: 3 mmol/L — ABNORMAL LOW (ref 3.5–5.1)
Sodium: 135 mmol/L (ref 135–145)

## 2021-02-09 LAB — CBC
HCT: 30.2 % — ABNORMAL LOW (ref 36.0–46.0)
Hemoglobin: 9.7 g/dL — ABNORMAL LOW (ref 12.0–15.0)
MCH: 27.2 pg (ref 26.0–34.0)
MCHC: 32.1 g/dL (ref 30.0–36.0)
MCV: 84.6 fL (ref 80.0–100.0)
Platelets: 496 10*3/uL — ABNORMAL HIGH (ref 150–400)
RBC: 3.57 MIL/uL — ABNORMAL LOW (ref 3.87–5.11)
RDW: 14.4 % (ref 11.5–15.5)
WBC: 8.6 10*3/uL (ref 4.0–10.5)
nRBC: 0 % (ref 0.0–0.2)

## 2021-02-09 LAB — C DIFFICILE QUICK SCREEN W PCR REFLEX
C Diff antigen: NEGATIVE
C Diff interpretation: NOT DETECTED
C Diff toxin: NEGATIVE

## 2021-02-09 LAB — GLUCOSE, CAPILLARY
Glucose-Capillary: 297 mg/dL — ABNORMAL HIGH (ref 70–99)
Glucose-Capillary: 306 mg/dL — ABNORMAL HIGH (ref 70–99)
Glucose-Capillary: 229 mg/dL — ABNORMAL HIGH (ref 70–99)
Glucose-Capillary: 126 mg/dL — ABNORMAL HIGH (ref 70–99)

## 2021-02-09 LAB — SARS CORONAVIRUS 2 (TAT 6-24 HRS): SARS Coronavirus 2: NEGATIVE

## 2021-02-09 LAB — HEMOGLOBIN A1C
Hgb A1c MFr Bld: 11.7 % — ABNORMAL HIGH (ref 4.8–5.6)
Mean Plasma Glucose: 289.09 mg/dL

## 2021-02-09 LAB — VITAMIN B12: Vitamin B-12: 1067 pg/mL — ABNORMAL HIGH (ref 180–914)

## 2021-02-09 MED ORDER — PRAMIPEXOLE DIHYDROCHLORIDE 1 MG PO TABS
1.0000 mg | ORAL_TABLET | Freq: Every day | ORAL | Status: DC
  Administered 2021-02-09: 1 mg via ORAL
  Filled 2021-02-09: qty 1

## 2021-02-09 MED ORDER — ALPRAZOLAM 0.5 MG PO TABS: 0.5000 mg | ORAL_TABLET | Freq: Four times a day (QID) | ORAL | Status: DC | PRN

## 2021-02-09 MED ORDER — POTASSIUM CHLORIDE 10 MEQ/100ML IV SOLN
10.0000 meq | INTRAVENOUS | Status: AC
  Administered 2021-02-09 (×4): 10 meq via INTRAVENOUS
  Filled 2021-02-09 (×4): qty 100

## 2021-02-09 MED ORDER — LAMOTRIGINE 100 MG PO TABS
400.0000 mg | ORAL_TABLET | Freq: Every day | ORAL | Status: DC
  Administered 2021-02-09: 400 mg via ORAL
  Filled 2021-02-09: qty 4

## 2021-02-09 MED ORDER — INSULIN ASPART 100 UNIT/ML IJ SOLN: 0.0000 [IU] | Freq: Every day | INTRAMUSCULAR | Status: DC

## 2021-02-09 MED ORDER — ONDANSETRON HCL 4 MG/2ML IJ SOLN
4.0000 mg | Freq: Four times a day (QID) | INTRAMUSCULAR | Status: DC | PRN
  Administered 2021-02-09 – 2021-02-10 (×3): 4 mg via INTRAVENOUS
  Filled 2021-02-09 (×2): qty 2

## 2021-02-09 MED ORDER — METHYLPREDNISOLONE SODIUM SUCC 40 MG IJ SOLR
40.0000 mg | Freq: Every day | INTRAMUSCULAR | Status: DC
  Administered 2021-02-09 – 2021-02-10 (×2): 40 mg via INTRAVENOUS
  Filled 2021-02-09 (×2): qty 1

## 2021-02-09 MED ORDER — ADULT MULTIVITAMIN W/MINERALS CH
1.0000 | ORAL_TABLET | Freq: Every day | ORAL | Status: DC
  Administered 2021-02-10: 1 via ORAL
  Filled 2021-02-09 (×2): qty 1

## 2021-02-09 MED ORDER — PANTOPRAZOLE SODIUM 40 MG PO TBEC
40.0000 mg | DELAYED_RELEASE_TABLET | Freq: Every day | ORAL | Status: DC
  Administered 2021-02-09 – 2021-02-10 (×2): 40 mg via ORAL
  Filled 2021-02-09 (×2): qty 1

## 2021-02-09 MED ORDER — RISPERIDONE 3 MG PO TABS
3.0000 mg | ORAL_TABLET | Freq: Every day | ORAL | Status: DC
  Administered 2021-02-09: 3 mg via ORAL
  Filled 2021-02-09: qty 1

## 2021-02-09 MED ORDER — INSULIN ASPART 100 UNIT/ML IJ SOLN
0.0000 [IU] | Freq: Three times a day (TID) | INTRAMUSCULAR | Status: DC
  Administered 2021-02-09: 5 [IU] via SUBCUTANEOUS
  Administered 2021-02-09: 3 [IU] via SUBCUTANEOUS
  Administered 2021-02-09: 7 [IU] via SUBCUTANEOUS
  Administered 2021-02-10: 2 [IU] via SUBCUTANEOUS

## 2021-02-09 MED ORDER — SODIUM CHLORIDE 0.9 % IV SOLN: 12.5000 mg | Freq: Four times a day (QID) | INTRAVENOUS | Status: DC | PRN

## 2021-02-09 MED ORDER — POTASSIUM CHLORIDE CRYS ER 20 MEQ PO TBCR
40.0000 meq | EXTENDED_RELEASE_TABLET | Freq: Once | ORAL | Status: AC
  Administered 2021-02-09: 40 meq via ORAL
  Filled 2021-02-09: qty 2

## 2021-02-09 MED ORDER — ACETAMINOPHEN 325 MG PO TABS
650.0000 mg | ORAL_TABLET | Freq: Four times a day (QID) | ORAL | Status: DC | PRN
  Filled 2021-02-09: qty 2

## 2021-02-09 MED ORDER — SERTRALINE HCL 100 MG PO TABS
100.0000 mg | ORAL_TABLET | Freq: Every day | ORAL | Status: DC
  Administered 2021-02-09 – 2021-02-10 (×2): 100 mg via ORAL
  Filled 2021-02-09: qty 1

## 2021-02-09 MED ORDER — ONDANSETRON HCL 4 MG/2ML IJ SOLN
INTRAMUSCULAR | Status: AC
  Filled 2021-02-09: qty 2

## 2021-02-09 MED ORDER — VANCOMYCIN HCL 125 MG PO CAPS
125.0000 mg | ORAL_CAPSULE | Freq: Four times a day (QID) | ORAL | Status: DC
  Administered 2021-02-09 – 2021-02-10 (×5): 125 mg via ORAL
  Filled 2021-02-09 (×6): qty 1

## 2021-02-09 MED ORDER — HYDROMORPHONE HCL 1 MG/ML IJ SOLN
0.5000 mg | Freq: Once | INTRAMUSCULAR | Status: DC
  Filled 2021-02-09: qty 0.5

## 2021-02-09 MED ORDER — SERTRALINE HCL 50 MG PO TABS
150.0000 mg | ORAL_TABLET | Freq: Every day | ORAL | Status: DC
  Filled 2021-02-09: qty 3

## 2021-02-09 NOTE — Progress Notes (Addendum)
PROGRESS NOTE    Amy Hansen  PQZ:[id number] DOB: 1955-08-05 DOA: 02/08/2021 PCP: [name], NP    Brief Narrative:  Amy Hansen is a 65 y.o. female with medical history significant for Crohn's disease, type 2 diabetes, chronic anxiety/depression, iron deficiency anemia, GERD who [facility] due to persistent abdominal pain, gradually worsening in the last week.  Associated with intermittent nausea, vomiting, diarrhea, intermittent blood in the stool, fever 105 the night prior to presentation.  Patient is on prednisone taper.  Reports symptoms onset in October 2021 intermittently, on prednisone taper off and on, no worsening or improving factors.  She follows with GI [place].  She was started on p.o. vancomycin and prednisone taper 4 days ago.  She presented to the ED for further evaluation.  CT abdomen and pelvis showed colitis to the distal colon.  Stool cultures recommended.  No bowel obstruction.  A 6 mm focus of calcification in the upper pole of the left kidney no hydronephrosis.  Aortic atherosclerosis.    7/14 stool studies pending  Consultants:  GI  Procedures: CT scan  Antimicrobials:  Ceftriaxone x1 vancomycin, metronidazole   Subjective: Feels very weak this a.m.  Still with mild abdominal pain.  Although feeling better than when she had was admitted.  Objective: Vitals:   02/08/21 2200 02/08/21 2300 02/09/21 0242 02/09/21 0310  BP: 130/74  123/72 127/85  Pulse: 82  82 83  Resp: 15  16 20   Temp:   97.9 F (36.6 C) 98.2 F (36.8 C)  TempSrc:   Oral Oral  SpO2: 97%  96% 97%  Weight:  59 kg    Height:  5' 1"  (1.549 m)      Intake/Output Summary (Last 24 hours) at 02/09/2021 0713 Last data filed at 02/09/2021 0500 Gross per 24 hour  Intake 2348.09 ml  Output --  Net 2348.09 ml   Filed Weights   02/08/21 2300  Weight: 59 kg    Examination:  General exam: Appears calm and comfortable, looks weak Respiratory system: Clear to  auscultation. Respiratory effort normal. Cardiovascular system: S1 & S2 heard, RRR. No gallop Gastrointestinal system: Abdomen is nondistended, soft and nontender.  Normal bowel sounds heard. Central nervous system: Alert and oriented.  Grossly intact Extremities: No edema Skin: Warm dry Psychiatry: Judgement and insight appear normal. Mood & affect appropriate.     Data Reviewed: I have personally reviewed following labs and imaging studies  CBC: Recent Labs  Lab 02/02/21 1201 02/08/21 1253 02/09/21 0614  WBC 17.6* 16.3* 8.6  NEUTROABS 15.5*  --   --   HGB 10.9* 12.0 9.7*  HCT 33.9* 38.0 30.2*  MCV 84.5 85.0 84.6  PLT 576.0* 639* 335*   Basic Metabolic Panel: Recent Labs  Lab 02/08/21 1253  NA 126*  K 3.5  CL 93*  CO2 21*  GLUCOSE 453*  BUN 5*  CREATININE 0.70  CALCIUM 8.3*   GFR: Estimated Creatinine Clearance: 59.4 mL/min (by C-G formula based on SCr of 0.7 mg/dL). Liver Function Tests: Recent Labs  Lab 02/08/21 1253  AST 9*  ALT 11  ALKPHOS 130*  BILITOT 0.5  PROT 6.5  ALBUMIN 2.3*   Recent Labs  Lab 02/08/21 1253  LIPASE 21   No results for input(s): AMMONIA in the last 168 hours. Coagulation Profile: No results for input(s): INR, PROTIME in the last 168 hours. Cardiac Enzymes: No results for input(s): CKTOTAL, CKMB, CKMBINDEX, TROPONINI in the last 168 hours. BNP (last 3 results)  No results for input(s): PROBNP in the last 8760 hours. HbA1C: Recent Labs    02/09/21 0614  HGBA1C 11.7*   CBG: No results for input(s): GLUCAP in the last 168 hours. Lipid Profile: No results for input(s): CHOL, HDL, LDLCALC, TRIG, CHOLHDL, LDLDIRECT in the last 72 hours. Thyroid Function Tests: No results for input(s): TSH, T4TOTAL, FREET4, T3FREE, THYROIDAB in the last 72 hours. Anemia Panel: No results for input(s): VITAMINB12, FOLATE, FERRITIN, TIBC, IRON, RETICCTPCT in the last 72 hours. Sepsis Labs: Recent Labs  Lab 02/08/21 1808 02/08/21 2317   LATICACIDVEN 3.2* 0.9    Recent Results (from the past 240 hour(s))  Clostridium Difficile by PCR     Status: None   Collection Time: 02/03/21  2:49 PM   Specimen: STOOL   Stool  Result Value Ref Range Status   Toxigenic C. Difficile by PCR CANCELED      Comment: Test not performed. Specimen received frozen.  Result canceled by the ancillary.   SARS CORONAVIRUS 2 (TAT 6-24 HRS) Nasopharyngeal Nasopharyngeal Swab     Status: None   Collection Time: 02/08/21 11:51 PM   Specimen: Nasopharyngeal Swab  Result Value Ref Range Status   SARS Coronavirus 2 NEGATIVE NEGATIVE Final    Comment: (NOTE) SARS-CoV-2 target nucleic acids are NOT DETECTED.  The SARS-CoV-2 RNA is generally detectable in upper and lower respiratory specimens during the acute phase of infection. Negative results do not preclude SARS-CoV-2 infection, do not rule out co-infections with other pathogens, and should not be used as the sole basis for treatment or other patient management decisions. Negative results must be combined with clinical observations, patient history, and epidemiological information. The expected result is Negative.  Fact Sheet for Patients: SugarRoll.be  Fact Sheet for Healthcare Providers: https://www.woods-mathews.com/  This test is not yet approved or cleared by the [place] FDA and  has been authorized for detection and/or diagnosis of SARS-CoV-2 by FDA under an Emergency Use Authorization (EUA). This EUA will remain  in effect (meaning this test can be used) for the duration of the COVID-19 declaration under Se ction 564(b)(1) of the Act, 21 U.S.C. section 360bbb-3(b)(1), unless the authorization is terminated or revoked sooner.  Performed at [facility], [place] [address] [place], [place] [address]          Radiology Studies: CT ABDOMEN PELVIS W CONTRAST  Result Date: 02/08/2021 CLINICAL DATA:  65 year old female with  abdominal pain and fever. EXAM: CT ABDOMEN AND PELVIS WITH CONTRAST TECHNIQUE: Multidetector CT imaging of the abdomen and pelvis was performed using the standard protocol following bolus administration of intravenous contrast. CONTRAST:  150m OMNIPAQUE IOHEXOL 300 MG/ML  SOLN COMPARISON:  CT abdomen pelvis dated 04/21/2020. FINDINGS: Lower chest: Minimal bibasilar atelectasis. The visualized lung bases are otherwise clear. There is coronary vascular calcification. No intra-abdominal free air or free fluid. Hepatobiliary: The liver is unremarkable. There is mild intrahepatic biliary duct dilatation, likely post cholecystectomy. No retained calcified stone noted in the central CBD. Pancreas: No acute findings. Spleen: Normal in size without focal abnormality. Adrenals/Urinary Tract: The adrenal glands are unremarkable. There is a 6 mm focus of calcification or a nonobstructing stone in the upper pole of the left kidney. There is mild cortical irregularity and scarring of the upper pole of the left kidney. There is no hydronephrosis on either side. There is symmetric enhancement and excretion of contrast by both kidneys. The visualized ureters and the urinary bladder are unremarkable. Stomach/Bowel: There are scattered distal colonic  diverticula without active inflammatory changes. There is diffuse thickened appearance of the distal colon most consistent with colitis. Correlation with clinical exam and stool cultures recommended. There is postsurgical changes of the bowel with anastomotic sutures. There is a small hiatal hernia. There is no bowel obstruction. The appendix is not visualized with certainty. No inflammatory changes identified in the right lower quadrant. Vascular/Lymphatic: Mild aortoiliac atherosclerotic disease. The IVC is unremarkable. No portal venous gas. There is no adenopathy. Reproductive: Hysterectomy. No adnexal masses. Other: None Musculoskeletal: Chronic appearing mild T12 compression  fracture and Schmorl's node of the superior endplate. No acute osseous pathology. IMPRESSION: 1. Colitis of the distal colon. Correlation with clinical exam and stool cultures recommended. No bowel obstruction. 2. Colonic diverticulosis. 3. A 6 mm focus of calcification in the upper pole of the left kidney. No hydronephrosis. 4. Aortic Atherosclerosis (ICD10-I70.0). Electronically Signed   By: Anner Crete M.D.   On: 02/08/2021 21:17        Scheduled Meds:  enoxaparin (LOVENOX) injection  40 mg Subcutaneous Q24H    HYDROmorphone (DILAUDID) injection  0.5 mg Intravenous Once   insulin aspart  0-5 Units Subcutaneous QHS   insulin aspart  0-9 Units Subcutaneous TID WC   lamoTRIgine  400 mg Oral QHS   methylPREDNISolone (SOLU-MEDROL) injection  40 mg Intravenous Daily   pramipexole  1 mg Oral QHS   risperiDONE  3 mg Oral QHS   sertraline  150 mg Oral Daily   vancomycin  125 mg Oral QID   Continuous Infusions:  sodium chloride 75 mL/hr at 02/09/21 0131   cefTRIAXone (ROCEPHIN)  IV     metronidazole Stopped (02/09/21 0233)   promethazine (PHENERGAN) injection (IM or IVPB)      Assessment & Plan:   Active Problems:   Colitis   Crohn's colitis Presented with worsening abdominal pain, nausea, vomiting, diarrhea, fever of 105 last night, intermittent blood in the stool Followed by GI [place] On prednisone taper prior to admission, switched to Solu-Medrol IV 40 mg daily. 7/14 GI consulted, pending Continue IV antibiotics started in ED, vancomycin and metronidazole IV fluids for hydration Monitor H&H   Diarrhea in the setting of Crohn's colitis She was started on p.o. vancomycin outpatient x10 days with end date on 02/16/2021. Lactic acid improved with IV fluids 7/14 stool for C. difficile PCR and GI panel are pending  Hypovolemic hyponatremia Sodium 126>>> 135 Improving with IV fluids Monitor levels  Hypokalemia K3.0 this a.m. Will replace with p.o. and IV Monitor  levels  Acute drop in h/h- Hemoglobin from 12>>>> 9.7 She did report intermittent blood in the stool Also possibly dilutional as she was dehydrated on admission and receiving IV fluids Continue to monitor Transfuse if hemoglobin less than 7    Type 2 diabetes with hyperglycemia Likely exacerbated by steroids Obtain A1c 7/14 BG's are elevated On Lantus at home we will start low dose here Lantus 3 units qd   Moderate protein calorie malnutrition Albumin 2.3 Moderate muscle mass loss RD consult   Isolated elevated alkaline phosphatase, nonspecific Monitor for now   Restless leg syndrome Resume home regimen   Chronic anxiety/depression Continue home regimen   Pyuria, asymptomatic Leukocyte esterase small, nitrite positive, WBC 6-10. Obtain urine culture Follow for ID and sensitivities.     DVT prophylaxis: Lovenox discontinued as H&H dropped.  SCD to start Code Status: Full Family Communication: None at bedside Disposition Plan:  Status is: Inpatient   Remains inpatient appropriate because:Inpatient level of care  appropriate due to severity of illness Dispo:  Patient From: Home  Planned Disposition: Home  Medically stable for discharge: No              LOS: 1 day   Time spent: 45 minutes with more than 50% on [place], MD [facility] Pager [phone]  If 7PM-7AM, please contact night-coverage 02/09/2021, 7:13 AM

## 2021-02-09 NOTE — Discharge Instructions (Signed)

## 2021-02-09 NOTE — Progress Notes (Addendum)
Inpatient Diabetes Program Recommendations  AACE/ADA: New Consensus Statement on Inpatient Glycemic Control (2015)  Target Ranges:  Prepandial:   less than 140 mg/dL      Peak postprandial:   less than 180 mg/dL (1-2 hours)      Critically ill patients:  140 - 180 mg/dL   Lab Results  Component Value Date   GLUCAP 306 (H) 02/09/2021   HGBA1C 11.7 (H) 02/09/2021    Review of Glycemic Control Results for ELODIA, HAVILAND (MRN [id number]) as of 02/09/2021 11:29  Ref. Range 02/09/2021 09:15 02/09/2021 11:23  Glucose-Capillary Latest Ref Range: 70 - 99 mg/dL 297 (H) 306 (H)   Diabetes history: DM 2 Outpatient Diabetes medications:  Glucotrol 5 mg bid, Lantus 24 units daily, Metformin 1000 mg bid Current orders for Inpatient glycemic control:  Novolog sensitive tid with meals and HS Solumedrol 40 mg daily  Inpatient Diabetes Program Recommendations:    Please add Lantus 24 units daily.  This is patients home dose.  Also consider adding Novolog 4 units tid with meals (hold if patient eats less than 50% or NPO).   Thanks,  Adah Perl, RN, BC-ADM Inpatient Diabetes Coordinator Pager [phone]    Addendum:  Spoke with patient regarding A1C=11.7%.  She states that she was recently started on steroids which likely affected her blood sugar control.  Informed her of A1C and she was very surprised. She had not been monitoring blood sugars at home prior to admit.  Recommended that she check blood sugars at least 2 times a day and reminded her of glucose goals of 100-180 mg/dL.  She see's MD at [facility].  She states she does take her insulin as ordered.  May need adjustment in home meds if she is sent home on steroids.  Patient appreciative of call.  Will follow.

## 2021-02-09 NOTE — Plan of Care (Signed)

## 2021-02-09 NOTE — Plan of Care (Signed)
Care plan added

## 2021-02-09 NOTE — Consult Note (Addendum)
Consultation  Referring Provider:      Primary Care Physician:  [name], NP Primary Gastroenterologist:         Reason for Consultation:          Impression / Plan:   Acute febrile diarrheal illness most likely C. difficile question other infection question possibility of her Crohn's colitis though I favor an infection such as C. difficile.  Continue current care and await C. difficile testing further steps after that.  I think that the findings on CT represent her known Crohn's colitis, she had antibiotic use in the past few months and Dr. Havery Moros was suspicious of C. difficile but unfortunately the outpatient specimen was submitted improperly though she followed the directions about her lab.  Uncontrolled diabetes mellitus exacerbated by steroids though that is only recently so there are other issues  Pseudohyponatremia related to severe hyperglycemia at presentation, resolved  Normocytic anemia history of iron deficiency, also B12 level 208 earlier this year.  Would not repeat iron test right now necessarily because they can  be acute phase reactants.  She has had iron supplementation before.  Her hemoglobin is relatively stable.  I will repeat her B12 level  We will follow-up regarding the results and plans.  I have allowed her to have a soft carb modified diet.  I agree with dietitian input.  She needs nutrient dense foods and I would favor a lower carbohydrate diet in this patient for many reasons.  Diabetes is uncontrolled at this time certainly exacerbated by steroids.  If she is C. difficile positive we will reduce the steroids quickly.   I have discontinued ceftriaxone I think we need to cover C. difficile and her Crohn's disease for now.   I appreciate the opportunity to care for you.  Gatha Mayer, MD, [facility] [place] [facility] 02/09/2021 10:33 AM          HPI:   Amy Hansen is a 65 y.o. female with bariatric surgery with gastric bypass,  and Crohn's colitis diagnosed last year and confirmed to have chronic colitis on colonoscopy in March.  She had a granulomatous colitis last fall without signs of chronicity but has had persistent problems though improved on Lialda as an outpatient.  More recently she developed diarrhea problems around-the-clock she was recommended to start prednisone which she did she felt a little bit better.  A stool C. difficile specimen was submitted but she says our lab told her to freeze it which was not correct so it was resubmitted and that is pending as well as labs from the ER last night when she presented with worsening diarrhea and problems.  A CT scan has shown distal colitis with circumferential thickening this is consistent with the sigmoid findings at March 2022 colonoscopy per Dr. Havery Moros.  She feels a little better has not defecated since last night and wants to eat.  Cramps are less.  She has been febrile.  She has been without insurance which has been a barrier to treatment, plans are to start biologic therapy at some point.  She has a job at this point 2 months and at 3 months she is able to refer her benefits.   She has had a little bit of bleeding with this but not much.  She says this illness is not as severe as what led her to hospitalization last year at the time of her diagnosis.  Past Medical History:  Diagnosis Date   Anemia  Anxiety    Chronic kidney disease    UTI, hematuria in urine   Colitis    Crohn's disease ([place])    Depression    Diabetes ([place])    Diverticulosis    Frequent headaches    Interstitial cystitis    Recurrent UTI    Restless leg syndrome    Urinary frequency     Past Surgical History:  Procedure Laterality Date   ABDOMINAL HYSTERECTOMY     bariatric bypass  2012   BIOPSY  05/03/2020   Procedure: BIOPSY;  Surgeon: Milus Banister, MD;  Location: [facility] ENDOSCOPY;  Service: Endoscopy;;   CARPAL TUNNEL RELEASE Right 2003   CARPAL TUNNEL RELEASE Right     2008   CHOLECYSTECTOMY  1975   COLONOSCOPY WITH PROPOFOL N/A 05/03/2020   Procedure: COLONOSCOPY WITH PROPOFOL;  Surgeon: Milus Banister, MD;  Location: [facility] ENDOSCOPY;  Service: Endoscopy;  Laterality: N/A;   CYSTOSCOPY W/ RETROGRADES Bilateral 06/06/2015   Procedure: CYSTOSCOPY WITH RETROGRADE PYELOGRAM;  Surgeon: Festus Aloe, MD;  Location: [facility] ORS;  Service: Urology;  Laterality: Bilateral;   FL INJ LEFT KNEE CT ARTHROGRAM ([facility] HX) Left    1995   GASTRIC BYPASS  2010   HEMORRHOID SURGERY  2013   KNEE ARTHROSCOPY Left 1996   TONSILLECTOMY      Family History  Problem Relation Age of Onset   Stroke Father    Colon cancer Mother    Heart failure Sister    Bladder Cancer Neg Hx    Kidney disease Neg Hx    Prostate cancer Neg Hx    Kidney cancer Neg Hx    Pancreatic cancer Neg Hx    Esophageal cancer Neg Hx    Stomach cancer Neg Hx    Rectal cancer Neg Hx     Social History   Tobacco Use   Smoking status: Former    Packs/day: 0.50    Types: Cigarettes    Quit date: 04/25/1975    Years since quitting: 45.8   Smokeless tobacco: Never   Tobacco comments:    quit 40 years  Vaping Use   Vaping Use: Never used  Substance Use Topics   Alcohol use: No    Alcohol/week: 0.0 standard drinks   Drug use: No    Prior to Admission medications   Medication Sig Start Date End Date Taking? Authorizing Provider  ALPRAZolam (XANAX) 0.5 MG tablet TAKE 1 TABLET BY MOUTH 4 TIMES DAILY AS NEEDED FOR ANXIETY Patient taking differently: Take 0.5 mg by mouth 4 (four) times daily as needed for anxiety. 10/19/20  Yes Addison Lank, PA-C  buPROPion (WELLBUTRIN XL) 150 MG 24 hr tablet Take 3 tablets by mouth once daily 09/29/20  Yes Hurst, Teresa T, PA-C  Cholecalciferol (VITAMIN D) 50 MCG (2000 UT) CAPS Take 1 capsule (2,000 Units total) by mouth daily. 09/02/20  Yes Armbruster, Carlota Raspberry, MD  furosemide (LASIX) 20 MG tablet Take 1 tablet (80m) daily. Take an additional tablet (286m AS  NEEDED. Patient taking differently: Take 20 mg by mouth daily. Take 1 tablet (202mdaily. Take an additional tablet (86m49mS NEEDED. 12/27/20  Yes WalkLoel Dubonnet  glipiZIDE (GLUCOTROL) 5 MG tablet TAKE 1 TABLET BY MOUTH TWICE DAILY WITH MEALS (APPOINTMENT  REQUIRED  FOR  FUTURE  REFILLS) Patient taking differently: Take 5 mg by mouth 2 (two) times daily. TAKE 1 TABLET BY MOUTH TWICE DAILY WITH MEALS (APPOINTMENT  REQUIRED  FOR  FUTURE  REFILLS)  11/05/20  Yes [name], NP  insulin glargine, 2 Unit Dial, (TOUJEO MAX SOLOSTAR) 300 UNIT/ML Solostar Pen Inject 24 units daily. Titrate as instructed. Max daily dose 40 units 10/27/20  Yes [name], NP  Insulin Pen Needle (PEN NEEDLES) 32G X 4 MM MISC 1 Units by Does not apply route at bedtime. 12/20/19  Yes Volney American, PA-C  lamoTRIgine (LAMICTAL) 200 MG tablet Take 2 tablets (400 mg total) by mouth at bedtime. 09/02/20  Yes Donnal Moat T, PA-C  loperamide (IMODIUM) 2 MG capsule Take 1 capsule (2 mg total) by mouth as needed for diarrhea or loose stools. 04/24/20  Yes Lorella Nimrod, MD  metFORMIN (GLUCOPHAGE) 1000 MG tablet TAKE 1 TABLET BY MOUTH TWICE DAILY WITH MEALS (APPOINTMENT  REQUIRED  FOR  FUTURE  REFILLS) Patient taking differently: Take 1,000 mg by mouth 2 (two) times daily with a meal. TAKE 1 TABLET BY MOUTH TWICE DAILY WITH MEALS (APPOINTMENT  REQUIRED  FOR  FUTURE  REFILLS) 01/04/21  Yes [name], NP  ondansetron (ZOFRAN-ODT) 4 MG disintegrating tablet DISSOLVE 1 TABLET IN MOUTH EVERY 6 HOURS AS NEEDED FOR NAUSEA Patient taking differently: Take 4 mg by mouth every 6 (six) hours as needed for nausea. 02/02/21  Yes Armbruster, Carlota Raspberry, MD  oxyCODONE-acetaminophen (PERCOCET) 5-325 MG tablet Take 1 tablet by mouth every 4 (four) hours as needed for severe pain. 01/06/21 01/06/22 Yes Sable Feil, PA-C  pramipexole (MIRAPEX) 1 MG tablet Take 1 tablet (1 mg total) by mouth at bedtime. 11/16/20  Yes Cannady, Jolene  T, NP  predniSONE (DELTASONE) 10 MG tablet Take 4 tablets (40 mg total) by mouth daily with breakfast for 14 days, THEN 3.5 tablets (35 mg total) daily with breakfast for 7 days, THEN 3 tablets (30 mg total) daily with breakfast for 7 days, THEN 2.5 tablets (25 mg total) daily with breakfast for 7 days, THEN 2 tablets (20 mg total) daily with breakfast for 7 days, THEN 1.5 tablets (15 mg total) daily with breakfast for 7 days, THEN 1 tablet (10 mg total) daily with breakfast for 7 days, THEN 0.5 tablets (5 mg total) daily with breakfast for 7 days. 02/02/21 04/06/21 Yes Armbruster, Carlota Raspberry, MD  risperiDONE (RISPERDAL) 3 MG tablet Take 1 tablet (3 mg total) by mouth at bedtime. 09/08/20  Yes Hurst, Teresa T, PA-C  sertraline (ZOLOFT) 100 MG tablet TAKE 1 & 1/2 (ONE & ONE-HALF) TABLETS BY MOUTH ONCE DAILY Patient taking differently: Take 100 mg by mouth daily. 09/02/20  Yes Hurst, Dorothea Glassman, PA-C  vancomycin (VANCOCIN) 125 MG capsule Take 1 capsule (125 mg total) by mouth 4 (four) times daily for 10 days. 02/02/21 02/12/21 Yes Armbruster, Carlota Raspberry, MD  estradiol (ESTRACE) 0.1 MG/GM vaginal cream Apply one pea-sized amount around the opening of the urethra three times weekly. Patient not taking: No sig reported 06/29/20   Debroah Loop, PA-C  ferrous sulfate 325 (65 FE) MG EC tablet Take 1 tablet (325 mg total) by mouth 2 (two) times daily. Patient not taking: Reported on 02/08/2021 04/24/20 04/24/21  Lorella Nimrod, MD  furosemide (LASIX) 20 MG tablet Take 2 tablets (46m) daily x 2 weeks, then take 1 tablet (239m daily. Take an additional 2060mablet AS NEEDED. Patient not taking: Reported on 02/08/2021 12/27/20 01/26/21  WalLoel DubonnetP  nystatin (MYCOSTATIN/NYSTOP) powder Apply 1 application topically 3 (three) times daily. Apply to lower abdominal fold tid x 2 to 3 weeks Patient not taking: No sig reported  07/12/20   Noralyn Pick, NP  pantoprazole (PROTONIX) 40 MG tablet Take 1 tablet (40  mg total) by mouth daily. Patient not taking: No sig reported 05/30/20   Noralyn Pick, NP  potassium chloride (KLOR-CON) 10 MEQ tablet Take 1 tablet (10 mEq total) by mouth daily for 14 days. Patient not taking: Reported on 02/08/2021 12/27/20 01/10/21  Loel Dubonnet, NP  Turmeric, Lear Ng, [place]) POWD Take as directed Patient not taking: No sig reported 10/25/20   Armbruster, Carlota Raspberry, MD    Current Facility-Administered Medications  Medication Dose Route Frequency Provider Last Rate Last Admin   0.9 %  sodium chloride infusion   Intravenous Continuous Kayleen Memos, DO 75 mL/hr at 02/09/21 0131 New Bag at 02/09/21 0131   ALPRAZolam (XANAX) tablet 0.5 mg  0.5 mg Oral QID PRN Kayleen Memos, DO       cefTRIAXone (ROCEPHIN) 2 g in sodium chloride 0.9 % 100 mL IVPB  2 g Intravenous Q24H Hall, Carole N, DO       enoxaparin (LOVENOX) injection 40 mg  40 mg Subcutaneous Q24H Hall, Carole N, DO   40 mg at 02/09/21 1004   HYDROmorphone (DILAUDID) injection 0.5 mg  0.5 mg Intravenous Once Kathryne Eriksson, NP       insulin aspart (novoLOG) injection 0-5 Units  0-5 Units Subcutaneous QHS Hall, Carole N, DO       insulin aspart (novoLOG) injection 0-9 Units  0-9 Units Subcutaneous TID WC Irene Pap N, DO   5 Units at 02/09/21 0955   lamoTRIgine (LAMICTAL) tablet 400 mg  400 mg Oral QHS Hall, Carole N, DO       methylPREDNISolone sodium succinate (SOLU-MEDROL) 40 mg/mL injection 40 mg  40 mg Intravenous Daily Irene Pap N, DO   40 mg at 02/09/21 0650   metroNIDAZOLE (FLAGYL) IVPB 500 mg  500 mg Intravenous Q8H Hall, Carole N, DO 100 mL/hr at 02/09/21 0958 500 mg at 02/09/21 0958   ondansetron (ZOFRAN) injection 4 mg  4 mg Intravenous Q6H PRN Irene Pap N, DO   4 mg at 02/09/21 0005   pramipexole (MIRAPEX) tablet 1 mg  1 mg Oral QHS Hall, Carole N, DO       promethazine (PHENERGAN) 12.5 mg in sodium chloride 0.9 % 50 mL IVPB  12.5 mg Intravenous Q6H PRN Irene Pap N, DO        risperiDONE (RISPERDAL) tablet 3 mg  3 mg Oral QHS Hall, Carole N, DO       sertraline (ZOLOFT) tablet 100 mg  100 mg Oral Daily Donnamae Jude, RPH   100 mg at 02/09/21 1005   vancomycin (VANCOCIN) capsule 125 mg  125 mg Oral QID Irene Pap N, DO        Allergies as of 02/08/2021 - Review Complete 02/08/2021  Allergen Reaction Noted   Avelox [moxifloxacin hcl in nacl] Anaphylaxis 04/19/2015   Bactrim [sulfamethoxazole-trimethoprim] Anaphylaxis 06/17/2018   Ciprofloxacin Other (See Comments) 10/15/2018   Depakote [divalproex sodium] Other (See Comments) 06/01/2018   Imitrex [sumatriptan] Other (See Comments) 05/21/2017   Stadol [butorphanol] Rash 04/19/2015     Review of Systems:    This is positive for those things mentioned in the HPI,All other review of systems are negative.       Physical Exam:  Vital signs in last 24 hours: Temp:  [97.9 F (36.6 C)-98.6 F (37 C)] 98.6 F (37 C) (07/14 0729) Pulse Rate:  [77-110] 77 (07/14  5956) Resp:  [14-20] 19 (07/14 0729) BP: (100-130)/(72-88) 127/78 (07/14 0729) SpO2:  [95 %-100 %] 96 % (07/14 0729) Weight:  [59 kg] 59 kg (07/13 2300)    General:  Somewhat pale white woman looking mild to moderately ill Eyes:  anicteric. Lungs: Clear to auscultation bilaterally anterior exam Heart:   S1S2, no rubs, murmurs, gallops. Abdomen:  soft, non-tender, no hepatosplenomegaly, hernia, or mass and BS+.  Bowel sounds are diminished Extremities:   no edema Neuro:  A&O x 3.  Psych:  appropriate mood and  Affect.   Data Reviewed:   LAB RESULTS: Recent Labs    02/08/21 1253 02/09/21 0614  WBC 16.3* 8.6  HGB 12.0 9.7*  HCT 38.0 30.2*  PLT 639* 496*   BMET Recent Labs    02/08/21 1253 02/09/21 0614  NA 126* 135  K 3.5 3.0*  CL 93* 104  CO2 21* 25  GLUCOSE 453* 221*  BUN 5* <5*  CREATININE 0.70 0.51  CALCIUM 8.3* 7.9*   LFT Recent Labs    02/08/21 1253  PROT 6.5  ALBUMIN 2.3*  AST 9*  ALT 11  ALKPHOS 130*   BILITOT 0.5    STUDIES: CT ABDOMEN PELVIS W CONTRAST  Result Date: 02/08/2021 CLINICAL DATA:  65 year old female with abdominal pain and fever. EXAM: CT ABDOMEN AND PELVIS WITH CONTRAST TECHNIQUE: Multidetector CT imaging of the abdomen and pelvis was performed using the standard protocol following bolus administration of intravenous contrast. CONTRAST:  115m OMNIPAQUE IOHEXOL 300 MG/ML  SOLN COMPARISON:  CT abdomen pelvis dated 04/21/2020. FINDINGS: Lower chest: Minimal bibasilar atelectasis. The visualized lung bases are otherwise clear. There is coronary vascular calcification. No intra-abdominal free air or free fluid. Hepatobiliary: The liver is unremarkable. There is mild intrahepatic biliary duct dilatation, likely post cholecystectomy. No retained calcified stone noted in the central CBD. Pancreas: No acute findings. Spleen: Normal in size without focal abnormality. Adrenals/Urinary Tract: The adrenal glands are unremarkable. There is a 6 mm focus of calcification or a nonobstructing stone in the upper pole of the left kidney. There is mild cortical irregularity and scarring of the upper pole of the left kidney. There is no hydronephrosis on either side. There is symmetric enhancement and excretion of contrast by both kidneys. The visualized ureters and the urinary bladder are unremarkable. Stomach/Bowel: There are scattered distal colonic diverticula without active inflammatory changes. There is diffuse thickened appearance of the distal colon most consistent with colitis. Correlation with clinical exam and stool cultures recommended. There is postsurgical changes of the bowel with anastomotic sutures. There is a small hiatal hernia. There is no bowel obstruction. The appendix is not visualized with certainty. No inflammatory changes identified in the right lower quadrant. Vascular/Lymphatic: Mild aortoiliac atherosclerotic disease. The IVC is unremarkable. No portal venous gas. There is no  adenopathy. Reproductive: Hysterectomy. No adnexal masses. Other: None Musculoskeletal: Chronic appearing mild T12 compression fracture and Schmorl's node of the superior endplate. No acute osseous pathology. IMPRESSION: 1. Colitis of the distal colon. Correlation with clinical exam and stool cultures recommended. No bowel obstruction. 2. Colonic diverticulosis. 3. A 6 mm focus of calcification in the upper pole of the left kidney. No hydronephrosis. 4. Aortic Atherosclerosis (ICD10-I70.0). Electronically Signed   By: AAnner CreteM.D.   On: 02/08/2021 21:17        Thanks   LOS: 1 day   @Madisson Kulaga  ESimonne Maffucci MD, FTaunton [facility]@  02/09/2021, 10:30 AM

## 2021-02-09 NOTE — Progress Notes (Signed)
Initial Nutrition Assessment  DOCUMENTATION CODES:  Severe malnutrition in context of acute illness/injury  INTERVENTION:  Add snacks TID - RD to order.  Add MVI with minerals daily.  NUTRITION DIAGNOSIS:  Severe Malnutrition related to acute illness (Crohn's disease flare) as evidenced by energy intake < or equal to 50% for > or equal to 5 days, severe fat depletion, severe muscle depletion.  GOAL:  Patient will meet greater than or equal to 90% of their needs  MONITOR:  PO intake, Supplement acceptance, Labs, Weight trends, I & O's  REASON FOR ASSESSMENT:  Consult, Malnutrition Screening Tool Assessment of nutrition requirement/status  ASSESSMENT:  65 yo female with PMH of Crohn's disease, T2DM, chronic anxiety/depression, iron deficiency anemia, and GERD who presents with colitis.  Spoke with pt and husband at bedside. Pt endorses a flare in her Crohn's disease, lasting the last several weeks. It was mostly diarrhea and stomach pain until about a week PTA, when pt experienced nausea and vomiting as well. She reports that she has been eating significantly less.  Pt endorses a 10-15 lb weight loss over the past 2 months. Per Epic, pt's weight has remained stable the past 5 months.  On exam, pt has some moderate to severe depletions.  Given the above information, pt with severe malnutrition in the acute setting.  Pt avoids dairy and ONS to avoid her Crohn's disease from flaring up. RD to order snacks TID. Also recommend adding MVI with minerals.  Medications: reviewed; SSI, Solu-Medrol, Vancomycin QID, NaCl @ 75 ml/hr via IV, ceftriaxone via IV, Flagyl TID via IV, K-Cl 10 mEq hourly via IV, Zofran PRN (given once today)  Labs: reviewed; K 3 (L), CBG 297 HbA1c: 11.7% (01/2021)  NUTRITION - FOCUSED PHYSICAL EXAM: Flowsheet Row Most Recent Value  Orbital Region Severe depletion  Upper Arm Region Mild depletion  Thoracic and Lumbar Region Mild depletion  Buccal Region Severe  depletion  Temple Region Severe depletion  Clavicle Bone Region Moderate depletion  Clavicle and Acromion Bone Region Moderate depletion  Scapular Bone Region Unable to assess  Dorsal Hand Moderate depletion  Patellar Region Moderate depletion  Anterior Thigh Region Moderate depletion  Posterior Calf Region Moderate depletion  Edema (RD Assessment) None  Hair Reviewed  Eyes Reviewed  Mouth Reviewed  Skin Reviewed  Nails Reviewed   Diet Order:   Diet Order             DIET SOFT Room service appropriate? Yes; Fluid consistency: Thin  Diet effective now                  EDUCATION NEEDS:  Education needs have been addressed  Skin:  Skin Assessment: Reviewed RN Assessment  Last BM:  PTA/unknown  Height:  Ht Readings from Last 1 Encounters:  02/08/21 5' 1"  (1.549 m)   Weight:  Wt Readings from Last 1 Encounters:  02/08/21 59 kg   BMI:  Body mass index is 24.58 kg/m.  Estimated Nutritional Needs:  Kcal:  1850-2050 Protein:  75-90 grams Fluid:  >1.85 L  Derrel Nip, RD, LDN (she/her/hers) Registered Dietitian I After-Hours/Weekend Pager # in [place]

## 2021-02-10 ENCOUNTER — Telehealth: Payer: Self-pay

## 2021-02-10 LAB — GASTROINTESTINAL PANEL BY PCR, STOOL (REPLACES STOOL CULTURE)

## 2021-02-10 LAB — CBC
HCT: 30.1 % — ABNORMAL LOW (ref 36.0–46.0)
Hemoglobin: 9.5 g/dL — ABNORMAL LOW (ref 12.0–15.0)
MCH: 26.9 pg (ref 26.0–34.0)
MCHC: 31.6 g/dL (ref 30.0–36.0)
MCV: 85.3 fL (ref 80.0–100.0)
Platelets: 531 10*3/uL — ABNORMAL HIGH (ref 150–400)
RBC: 3.53 MIL/uL — ABNORMAL LOW (ref 3.87–5.11)
RDW: 14.6 % (ref 11.5–15.5)
WBC: 5.9 10*3/uL (ref 4.0–10.5)
nRBC: 0 % (ref 0.0–0.2)

## 2021-02-10 LAB — GLUCOSE, CAPILLARY: Glucose-Capillary: 158 mg/dL — ABNORMAL HIGH (ref 70–99)

## 2021-02-10 MED ORDER — ONDANSETRON HCL 4 MG PO TABS: 4.0000 mg | ORAL_TABLET | Freq: Every day | ORAL | 1 refills | Status: DC | PRN

## 2021-02-10 MED ORDER — PREDNISONE 10 MG PO TABS: 10.0000 mg | ORAL_TABLET | Freq: Every day | ORAL | 0 refills | Status: DC

## 2021-02-10 MED ORDER — PREDNISONE 20 MG PO TABS: 40.0000 mg | ORAL_TABLET | Freq: Every day | ORAL | 0 refills | Status: AC

## 2021-02-10 NOTE — Progress Notes (Signed)
   Patient to go home today  C diff negative   Prednisone 40 mg daily and we will arrange f/u and taper   Gatha Mayer, MD, [facility] 02/10/2021 9:57 AM

## 2021-02-10 NOTE — Progress Notes (Signed)
Pt discharged to home. DC instructions given. No concerns voiced. Pt left unit in wheelchair pushed by hospital Transport team member. Left in stable condition.

## 2021-02-10 NOTE — Telephone Encounter (Signed)
Patient is scheduled for a follow up with Amy Esterwood on 02/23/21 at 1:30 PM.

## 2021-02-10 NOTE — Telephone Encounter (Signed)
-----   Message from Yetta Flock, MD sent at 02/10/2021  1:05 PM EDT ----- Regarding: RE: needs f/u Great, thank you Glendell Docker.  I know the pred has made her DM worse but unfortunately tough situation until she can get insurance to afford a biologic. Anikin Prosser can you let her know to take prednisone 39m / day for the next week and if feeling well can go to 321m/ day for the next week. It should get her to her appointment with Amy later this month and can discuss further taper at that time. Thanks  ----- Message ----- From: GeGatha MayerMD Sent: 02/10/2021   9:43 AM EDT To: StYetta FlockMD, BrYevette EdwardsRN Subject: needs f/u                                      Going home today on prednisone  C diff neg  Can we arrange a f/u with her in next couple of weeks APP vs work-in spot  I sent her on 40 mg prednisone so she may need advice on tapering depending upon tiiming of f/u, StRichardson Landry

## 2021-02-10 NOTE — Discharge Summary (Signed)
Physician Discharge Summary  Amy Hansen PQZ:[id number] DOB: Jun 12, 1956 DOA: 02/08/2021  PCP: [name], NP  Admit date: 02/08/2021 Discharge date: 02/10/2021  Admitted From: Home  Disposition:  Home   Recommendations for Outpatient Follow-up:  Follow up with GI in 2 weeks Please obtain BMP and CBC in 2 weeks     Home Health: None  Equipment/Devices: None new  Discharge Condition: Good  CODE STATUS: FULL Diet recommendation: Regular  Brief/Interim Summary: Amy Hansen is a 65 year old F with Crohn's disease, DM, IDA, and anxiety who presented with abdominal discomfort, nausea, frequent loose stools, and occasional hematochezia as well as fever to 105.  Had been started on vancomycin and prednisone taper several days ago as an outpatient, but continued to get worse.  In the ER CT abdomen and pelvis showed distal colitis, no bowel obstruction.  Admitted for IV fluids.     PRINCIPAL HOSPITAL DIAGNOSIS: Crohn's flare    Discharge Diagnoses:  Colitis Patient presented with abdominal pain, vomiting, diarrhea, fever, and hematochezia.  She was started on IV Solu-Medrol, as well as Flagyl and oral vancomycin and GI were consulted.  C. difficile testing was negative, antibiotics were stopped.  The patient symptoms improved, she was discharged with prednisone 40 mg daily and outpatient GI follow-up  Hyponatremia Improved with fluids  Hypokalemia Resolved with supplementation  Acute blood loss anemia Hemoglobin dropped from 12-9, possibly delusional, may be some hematochezia, no need for transfusion  Diabetes  Moderate protein calorie malnutrition  Asymptomatic pyuria No need for treatment            Discharge Instructions  Discharge Instructions     Discharge instructions   Complete by: As directed    From Dr. Loleta Books: You were admitted for diarrhea and vomiting.  Here, your Cdiff testing was negative. You should stop taking  vancomycin  We believe some of your symptoms are from Crohn's flaring up, so start prednisone Take prednisone 40 mg daily See Dr. Havery Moros WITHIN 2 weeks, and taper prednisone as he recommends   While you are on prednisone, your sugars will be high Take your Lantus and glipizide If your morning sugars are OVER 200, increase your Lantus by 3 units every 2 days If your sugars are LESS than 100, then decrease your Lantus by 3 units  Follow up with your PCP in 1 week to discuss blood sugar control  Take ondansetron as needed for nausea  Resume your other home meds   Increase activity slowly   Complete by: As directed       Allergies as of 02/10/2021       Reactions   Avelox [moxifloxacin Hcl In Nacl] Anaphylaxis   Bactrim [sulfamethoxazole-trimethoprim] Anaphylaxis   Ciprofloxacin Other (See Comments)   Pt states she was told never to take this as it is in the same family as Avelox.    Depakote [divalproex Sodium] Other (See Comments)   Unknown Reaction   Imitrex [sumatriptan] Other (See Comments)   Neck and shoulder pain   Stadol [butorphanol] Rash        Medication List     STOP taking these medications    Curcumin Powd   estradiol 0.1 MG/GM vaginal cream Commonly known as: ESTRACE   ferrous sulfate 325 (65 FE) MG EC tablet   nystatin powder Commonly known as: MYCOSTATIN/NYSTOP   ondansetron 4 MG disintegrating tablet Commonly known as: ZOFRAN-ODT   pantoprazole 40 MG tablet Commonly known as: PROTONIX   potassium chloride 10 MEQ tablet Commonly  known as: KLOR-CON   vancomycin 125 MG capsule Commonly known as: VANCOCIN       TAKE these medications    ALPRAZolam 0.5 MG tablet Commonly known as: XANAX TAKE 1 TABLET BY MOUTH 4 TIMES DAILY AS NEEDED FOR ANXIETY What changed: See the new instructions.   buPROPion 150 MG 24 hr tablet Commonly known as: WELLBUTRIN XL Take 3 tablets by mouth once daily   furosemide 20 MG tablet Commonly known as:  LASIX Take 1 tablet (27m) daily. Take an additional tablet (232m AS NEEDED. What changed:  how much to take how to take this when to take this Another medication with the same name was removed. Continue taking this medication, and follow the directions you see here.   glipiZIDE 5 MG tablet Commonly known as: GLUCOTROL TAKE 1 TABLET BY MOUTH TWICE DAILY WITH MEALS (APPOINTMENT  REQUIRED  FOR  FUTURE  REFILLS) What changed: See the new instructions.   lamoTRIgine 200 MG tablet Commonly known as: LAMICTAL Take 2 tablets (400 mg total) by mouth at bedtime.   loperamide 2 MG capsule Commonly known as: IMODIUM Take 1 capsule (2 mg total) by mouth as needed for diarrhea or loose stools.   metFORMIN 1000 MG tablet Commonly known as: GLUCOPHAGE TAKE 1 TABLET BY MOUTH TWICE DAILY WITH MEALS (APPOINTMENT  REQUIRED  FOR  FUTURE  REFILLS) What changed: See the new instructions.   ondansetron 4 MG tablet Commonly known as: Zofran Take 1 tablet (4 mg total) by mouth daily as needed for nausea or vomiting.   oxyCODONE-acetaminophen 5-325 MG tablet Commonly known as: Percocet Take 1 tablet by mouth every 4 (four) hours as needed for severe pain.   Pen Needles 32G X 4 MM Misc 1 Units by Does not apply route at bedtime.   pramipexole 1 MG tablet Commonly known as: MIRAPEX Take 1 tablet (1 mg total) by mouth at bedtime.   predniSONE 20 MG tablet Commonly known as: DELTASONE Take 2 tablets (40 mg total) by mouth daily with breakfast for 14 days. What changed: You were already taking a medication with the same name, and this prescription was added. Make sure you understand how and when to take each.   predniSONE 10 MG tablet Commonly known as: DELTASONE Take 1 tablet (10 mg total) by mouth daily. What changed: You were already taking a medication with the same name, and this prescription was added. Make sure you understand how and when to take each.   predniSONE 10 MG tablet Commonly  known as: DELTASONE Take 1 tablet (10 mg total) by mouth daily. What changed: You were already taking a medication with the same name, and this prescription was added. Make sure you understand how and when to take each.   predniSONE 20 MG tablet Commonly known as: DELTASONE Take 2 tablets (40 mg total) by mouth daily with breakfast for 14 days. What changed:  medication strength See the new instructions.   risperiDONE 3 MG tablet Commonly known as: RISPERDAL Take 1 tablet (3 mg total) by mouth at bedtime.   sertraline 100 MG tablet Commonly known as: ZOLOFT TAKE 1 & 1/2 (ONE & ONE-HALF) TABLETS BY MOUTH ONCE DAILY What changed:  how much to take how to take this when to take this additional instructions   Toujeo Max SoloStar 300 UNIT/ML Solostar Pen Generic drug: insulin glargine (2 Unit Dial) Inject 24 units daily. Titrate as instructed. Max daily dose 40 units   Vitamin D 50 MCG (2000 UT)  Caps Take 1 capsule (2,000 Units total) by mouth daily.        Follow-up Information     Armbruster, Carlota Raspberry, MD Follow up.   Specialty: Gastroenterology Why: Office will call and set up follow-up if appointment not seen in [place] information: [place] [address] [phone]         [name], NP. Schedule an appointment as soon as possible for a visit in 1 week(s).   Specialty: Nurse Practitioner Contact information: [address] [place] [address] [phone]         Nelva Bush, MD .   Specialty: Cardiology Contact information: [place] [address] [phone]                Allergies  Allergen Reactions   Avelox [Moxifloxacin Hcl In Nacl] Anaphylaxis   Bactrim [Sulfamethoxazole-Trimethoprim] Anaphylaxis   Ciprofloxacin Other (See Comments)    Pt states she was told never to take this as it is in the same family as Avelox.    Depakote [Divalproex Sodium] Other (See  Comments)    Unknown Reaction   Imitrex [Sumatriptan] Other (See Comments)    Neck and shoulder pain   Stadol [Butorphanol] Rash    Consultations: Gastroenterology   Procedures/Studies: CT ABDOMEN PELVIS W CONTRAST  Result Date: 02/08/2021 CLINICAL DATA:  66 year old female with abdominal pain and fever. EXAM: CT ABDOMEN AND PELVIS WITH CONTRAST TECHNIQUE: Multidetector CT imaging of the abdomen and pelvis was performed using the standard protocol following bolus administration of intravenous contrast. CONTRAST:  165m OMNIPAQUE IOHEXOL 300 MG/ML  SOLN COMPARISON:  CT abdomen pelvis dated 04/21/2020. FINDINGS: Lower chest: Minimal bibasilar atelectasis. The visualized lung bases are otherwise clear. There is coronary vascular calcification. No intra-abdominal free air or free fluid. Hepatobiliary: The liver is unremarkable. There is mild intrahepatic biliary duct dilatation, likely post cholecystectomy. No retained calcified stone noted in the central CBD. Pancreas: No acute findings. Spleen: Normal in size without focal abnormality. Adrenals/Urinary Tract: The adrenal glands are unremarkable. There is a 6 mm focus of calcification or a nonobstructing stone in the upper pole of the left kidney. There is mild cortical irregularity and scarring of the upper pole of the left kidney. There is no hydronephrosis on either side. There is symmetric enhancement and excretion of contrast by both kidneys. The visualized ureters and the urinary bladder are unremarkable. Stomach/Bowel: There are scattered distal colonic diverticula without active inflammatory changes. There is diffuse thickened appearance of the distal colon most consistent with colitis. Correlation with clinical exam and stool cultures recommended. There is postsurgical changes of the bowel with anastomotic sutures. There is a small hiatal hernia. There is no bowel obstruction. The appendix is not visualized with certainty. No inflammatory changes  identified in the right lower quadrant. Vascular/Lymphatic: Mild aortoiliac atherosclerotic disease. The IVC is unremarkable. No portal venous gas. There is no adenopathy. Reproductive: Hysterectomy. No adnexal masses. Other: None Musculoskeletal: Chronic appearing mild T12 compression fracture and Schmorl's node of the superior endplate. No acute osseous pathology. IMPRESSION: 1. Colitis of the distal colon. Correlation with clinical exam and stool cultures recommended. No bowel obstruction. 2. Colonic diverticulosis. 3. A 6 mm focus of calcification in the upper pole of the left kidney. No hydronephrosis. 4. Aortic Atherosclerosis (ICD10-I70.0). Electronically Signed   By: AAnner CreteM.D.   On: 02/08/2021 21:17      Subjective: Still tired.  No more vmiting  or ddiarrhea.  One stool overnight, formed, no blood.  No fever.    Discharge Exam: Vitals:   02/09/21 2033 02/10/21 0700  BP: (!) 152/70 121/74  Pulse: 77 66  Resp: 14 16  Temp: 98.2 F (36.8 C) 97.9 F (36.6 C)  SpO2: 96% 100%   Vitals:   02/09/21 0729 02/09/21 1124 02/09/21 2033 02/10/21 0700  BP: 127/78 129/74 (!) 152/70 121/74  Pulse: 77 79 77 66  Resp: 19 18 14 16   Temp: 98.6 F (37 C) 98.3 F (36.8 C) 98.2 F (36.8 C) 97.9 F (36.6 C)  TempSrc: Oral Oral  Oral  SpO2: 96% 100% 96% 100%  Weight:      Height:        General: Pt is alert, awake, not in acute distress, appears tired Cardiovascular: RRR, nl S1-S2, no murmurs appreciated.   No LE edema.   Respiratory: Normal respiratory rate and rhythm.  CTAB without rales or wheezes. Abdominal: Abdomen soft and non-tender.  No distension or HSM.   Neuro/Psych: Strength symmetric in upper and lower extremities.  Judgment and insight appear normal.   The results of significant diagnostics from this hospitalization (including imaging, microbiology, ancillary and laboratory) are listed below for reference.     Microbiology: Recent Results (from the past 240  hour(s))  Clostridium Difficile by PCR     Status: None   Collection Time: 02/03/21  2:49 PM   Specimen: STOOL   Stool  Result Value Ref Range Status   Toxigenic C. Difficile by PCR CANCELED      Comment: Test not performed. Specimen received frozen.  Result canceled by the ancillary.   Culture, blood (routine x 2)     Status: None (Preliminary result)   Collection Time: 02/08/21  6:08 PM   Specimen: BLOOD  Result Value Ref Range Status   Specimen Description BLOOD SITE NOT SPECIFIED  Final   Special Requests   Final    BOTTLES DRAWN AEROBIC AND ANAEROBIC Blood Culture results may not be optimal due to an excessive volume of blood received in culture bottles   Culture   Final    NO GROWTH 2 DAYS Performed at [facility], [place] [address], [place], [place] [address]    Report Status PENDING  Incomplete  Culture, blood (routine x 2)     Status: None (Preliminary result)   Collection Time: 02/08/21  6:17 PM   Specimen: BLOOD  Result Value Ref Range Status   Specimen Description BLOOD SITE NOT SPECIFIED  Final   Special Requests   Final    BOTTLES DRAWN AEROBIC AND ANAEROBIC Blood Culture results may not be optimal due to an excessive volume of blood received in culture bottles   Culture   Final    NO GROWTH 2 DAYS Performed at [facility], [place] [address], [place], [place] [address]    Report Status PENDING  Incomplete  SARS CORONAVIRUS 2 (TAT 6-24 HRS) Nasopharyngeal Nasopharyngeal Swab     Status: None   Collection Time: 02/08/21 11:51 PM   Specimen: Nasopharyngeal Swab  Result Value Ref Range Status   SARS Coronavirus 2 NEGATIVE NEGATIVE Final    Comment: (NOTE) SARS-CoV-2 target nucleic acids are NOT DETECTED.  The SARS-CoV-2 RNA is generally detectable in upper and lower respiratory specimens during the acute phase of infection. Negative results do not preclude SARS-CoV-2 infection, do not rule out co-infections with other pathogens, and should not be used  as the sole basis  for treatment or other patient management decisions. Negative results must be combined with clinical observations, patient history, and epidemiological information. The expected result is Negative.  Fact Sheet for Patients: SugarRoll.be  Fact Sheet for Healthcare Providers: https://www.woods-mathews.com/  This test is not yet approved or cleared by the [place] FDA and  has been authorized for detection and/or diagnosis of SARS-CoV-2 by FDA under an Emergency Use Authorization (EUA). This EUA will remain  in effect (meaning this test can be used) for the duration of the COVID-19 declaration under Se ction 564(b)(1) of the Act, 21 U.S.C. section 360bbb-3(b)(1), unless the authorization is terminated or revoked sooner.  Performed at [facility], [place] [address], [place], [place] [address]   Gastrointestinal Panel by PCR , Stool     Status: None   Collection Time: 02/09/21  3:35 PM   Specimen: Stool  Result Value Ref Range Status   Campylobacter species NOT DETECTED NOT DETECTED Final   Plesimonas shigelloides NOT DETECTED NOT DETECTED Final   Salmonella species NOT DETECTED NOT DETECTED Final   Yersinia enterocolitica NOT DETECTED NOT DETECTED Final   Vibrio species NOT DETECTED NOT DETECTED Final   Vibrio cholerae NOT DETECTED NOT DETECTED Final   Enteroaggregative E coli (EAEC) NOT DETECTED NOT DETECTED Final   Enteropathogenic E coli (EPEC) NOT DETECTED NOT DETECTED Final   Enterotoxigenic E coli (ETEC) NOT DETECTED NOT DETECTED Final   Shiga like toxin producing E coli (STEC) NOT DETECTED NOT DETECTED Final   Shigella/Enteroinvasive E coli (EIEC) NOT DETECTED NOT DETECTED Final   Cryptosporidium NOT DETECTED NOT DETECTED Final   Cyclospora cayetanensis NOT DETECTED NOT DETECTED Final   Entamoeba histolytica NOT DETECTED NOT DETECTED Final   Giardia lamblia NOT DETECTED NOT DETECTED Final   Adenovirus  F40/41 NOT DETECTED NOT DETECTED Final   Astrovirus NOT DETECTED NOT DETECTED Final   Norovirus GI/GII NOT DETECTED NOT DETECTED Final   Rotavirus A NOT DETECTED NOT DETECTED Final   Sapovirus (I, II, IV, and V) NOT DETECTED NOT DETECTED Final    Comment: Performed at [facility], [place] [place], [place] [address]  C Difficile Quick Screen w PCR reflex     Status: None   Collection Time: 02/09/21  3:35 PM   Specimen: Stool  Result Value Ref Range Status   C Diff antigen NEGATIVE NEGATIVE Final   C Diff toxin NEGATIVE NEGATIVE Final   C Diff interpretation No C. difficile detected.  Final    Comment: Performed at [facility], [place] [address] [place], [place] [address]     Labs: BNP (last 3 results) Recent Labs    05/26/20 0907 12/27/20 0929  BNP 55.0 29.9   Basic Metabolic Panel: Recent Labs  Lab 02/08/21 1253 02/09/21 0614  NA 126* 135  K 3.5 3.0*  CL 93* 104  CO2 21* 25  GLUCOSE 453* 221*  BUN 5* <5*  CREATININE 0.70 0.51  CALCIUM 8.3* 7.9*   Liver Function Tests: Recent Labs  Lab 02/08/21 1253  AST 9*  ALT 11  ALKPHOS 130*  BILITOT 0.5  PROT 6.5  ALBUMIN 2.3*   Recent Labs  Lab 02/08/21 1253  LIPASE 21   No results for input(s): AMMONIA in the last 168 hours. CBC: Recent Labs  Lab 02/08/21 1253 02/09/21 0614 02/10/21 0317  WBC 16.3* 8.6 5.9  HGB 12.0 9.7* 9.5*  HCT 38.0 30.2* 30.1*  MCV 85.0 84.6 85.3  PLT 639* 496* 531*  Cardiac Enzymes: No results for input(s): CKTOTAL, CKMB, CKMBINDEX, TROPONINI in the last 168 hours. BNP: Invalid input(s): POCBNP CBG: Recent Labs  Lab 02/09/21 0915 02/09/21 1123 02/09/21 1544 02/09/21 2036 02/10/21 0611  GLUCAP 297* 306* 229* 126* 158*   D-Dimer No results for input(s): DDIMER in the last 72 hours. Hgb A1c Recent Labs    02/09/21 0614  HGBA1C 11.7*   Lipid Profile No results for input(s): CHOL, HDL, LDLCALC, TRIG, CHOLHDL, LDLDIRECT in the last 72  hours. Thyroid function studies No results for input(s): TSH, T4TOTAL, T3FREE, THYROIDAB in the last 72 hours.  Invalid input(s): FREET3 Anemia work up Recent Labs    02/09/21 0614  VITAMINB12 1,067*   Urinalysis    Component Value Date/Time   COLORURINE YELLOW 02/08/2021 1253   APPEARANCEUR HAZY (A) 02/08/2021 1253   APPEARANCEUR Clear 11/07/2020 0907   LABSPEC 1.021 02/08/2021 1253   LABSPEC 1.003 01/09/2012 2126   PHURINE 6.0 02/08/2021 1253   GLUCOSEU >=500 (A) 02/08/2021 1253   GLUCOSEU Negative 01/09/2012 2126   HGBUR NEGATIVE 02/08/2021 1253   BILIRUBINUR NEGATIVE 02/08/2021 1253   BILIRUBINUR Negative 11/07/2020 0907   BILIRUBINUR Negative 01/09/2012 2126   KETONESUR 5 (A) 02/08/2021 1253   PROTEINUR NEGATIVE 02/08/2021 1253   NITRITE POSITIVE (A) 02/08/2021 1253   LEUKOCYTESUR SMALL (A) 02/08/2021 1253   LEUKOCYTESUR Negative 01/09/2012 2126   Sepsis Labs Invalid input(s): PROCALCITONIN,  WBC,  LACTICIDVEN Microbiology Recent Results (from the past 240 hour(s))  Clostridium Difficile by PCR     Status: None   Collection Time: 02/03/21  2:49 PM   Specimen: STOOL   Stool  Result Value Ref Range Status   Toxigenic C. Difficile by PCR CANCELED      Comment: Test not performed. Specimen received frozen.  Result canceled by the ancillary.   Culture, blood (routine x 2)     Status: None (Preliminary result)   Collection Time: 02/08/21  6:08 PM   Specimen: BLOOD  Result Value Ref Range Status   Specimen Description BLOOD SITE NOT SPECIFIED  Final   Special Requests   Final    BOTTLES DRAWN AEROBIC AND ANAEROBIC Blood Culture results may not be optimal due to an excessive volume of blood received in culture bottles   Culture   Final    NO GROWTH 2 DAYS Performed at [facility], [place] [address] [place], [place] [address]    Report Status PENDING  Incomplete  Culture, blood (routine x 2)     Status: None (Preliminary result)   Collection Time: 02/08/21   6:17 PM   Specimen: BLOOD  Result Value Ref Range Status   Specimen Description BLOOD SITE NOT SPECIFIED  Final   Special Requests   Final    BOTTLES DRAWN AEROBIC AND ANAEROBIC Blood Culture results may not be optimal due to an excessive volume of blood received in culture bottles   Culture   Final    NO GROWTH 2 DAYS Performed at [facility], [place] [address], [place], [place] [address]    Report Status PENDING  Incomplete  SARS CORONAVIRUS 2 (TAT 6-24 HRS) Nasopharyngeal Nasopharyngeal Swab     Status: None   Collection Time: 02/08/21 11:51 PM   Specimen: Nasopharyngeal Swab  Result Value Ref Range Status   SARS Coronavirus 2 NEGATIVE NEGATIVE Final    Comment: (NOTE) SARS-CoV-2 target nucleic acids are NOT DETECTED.  The SARS-CoV-2 RNA is generally detectable in upper and lower respiratory specimens during  the acute phase of infection. Negative results do not preclude SARS-CoV-2 infection, do not rule out co-infections with other pathogens, and should not be used as the sole basis for treatment or other patient management decisions. Negative results must be combined with clinical observations, patient history, and epidemiological information. The expected result is Negative.  Fact Sheet for Patients: SugarRoll.be  Fact Sheet for Healthcare Providers: https://www.woods-mathews.com/  This test is not yet approved or cleared by the [place] FDA and  has been authorized for detection and/or diagnosis of SARS-CoV-2 by FDA under an Emergency Use Authorization (EUA). This EUA will remain  in effect (meaning this test can be used) for the duration of the COVID-19 declaration under Se ction 564(b)(1) of the Act, 21 U.S.C. section 360bbb-3(b)(1), unless the authorization is terminated or revoked sooner.  Performed at [facility], [place] [address] [place], [place] [address]   Gastrointestinal Panel by PCR , Stool      Status: None   Collection Time: 02/09/21  3:35 PM   Specimen: Stool  Result Value Ref Range Status   Campylobacter species NOT DETECTED NOT DETECTED Final   Plesimonas shigelloides NOT DETECTED NOT DETECTED Final   Salmonella species NOT DETECTED NOT DETECTED Final   Yersinia enterocolitica NOT DETECTED NOT DETECTED Final   Vibrio species NOT DETECTED NOT DETECTED Final   Vibrio cholerae NOT DETECTED NOT DETECTED Final   Enteroaggregative E coli (EAEC) NOT DETECTED NOT DETECTED Final   Enteropathogenic E coli (EPEC) NOT DETECTED NOT DETECTED Final   Enterotoxigenic E coli (ETEC) NOT DETECTED NOT DETECTED Final   Shiga like toxin producing E coli (STEC) NOT DETECTED NOT DETECTED Final   Shigella/Enteroinvasive E coli (EIEC) NOT DETECTED NOT DETECTED Final   Cryptosporidium NOT DETECTED NOT DETECTED Final   Cyclospora cayetanensis NOT DETECTED NOT DETECTED Final   Entamoeba histolytica NOT DETECTED NOT DETECTED Final   Giardia lamblia NOT DETECTED NOT DETECTED Final   Adenovirus F40/41 NOT DETECTED NOT DETECTED Final   Astrovirus NOT DETECTED NOT DETECTED Final   Norovirus GI/GII NOT DETECTED NOT DETECTED Final   Rotavirus A NOT DETECTED NOT DETECTED Final   Sapovirus (I, II, IV, and V) NOT DETECTED NOT DETECTED Final    Comment: Performed at [facility], [place] [place], [place] [address]  C Difficile Quick Screen w PCR reflex     Status: None   Collection Time: 02/09/21  3:35 PM   Specimen: Stool  Result Value Ref Range Status   C Diff antigen NEGATIVE NEGATIVE Final   C Diff toxin NEGATIVE NEGATIVE Final   C Diff interpretation No C. difficile detected.  Final    Comment: Performed at [facility], [place] [address], [place], [place] [address]     Time coordinating discharge: 25 minutes     30 Day Unplanned Readmission Risk Score    Flowsheet Row ED to Hosp-Admission (Current) from 02/08/2021 in [place]  30 Day  Unplanned Readmission Risk Score (%) 21.61 Filed at 02/10/2021 0801       This score is the patient's risk of an unplanned readmission within 30 days of being discharged (0 -100%). The score is based on dignosis, age, lab data, medications, orders, and past utilization.   Low:  0-14.9   Medium: 15-21.9   High: 22-29.9   Extreme: 30 and above            SIGNED:   Edwin Dada, MD  [facility] 02/10/2021, 11:20 PM

## 2021-02-10 NOTE — Telephone Encounter (Signed)
Spoke with patient in regards to Prednisone taper as outlined by Dr. Havery Moros. Patient is aware that they will discuss tapering further at her appt with Nicoletta Ba on 02/23/21. Patient verbalized understanding and had no concerns at the end of the call.

## 2021-02-12 LAB — URINE CULTURE: Culture: >=100000 — AB

## 2021-02-13 ENCOUNTER — Telehealth: Payer: Self-pay

## 2021-02-13 LAB — CULTURE, BLOOD (ROUTINE X 2)
Culture: NO GROWTH
Culture: NO GROWTH

## 2021-02-13 NOTE — Telephone Encounter (Signed)
Transition Care Management Follow-up Telephone Call Date of discharge and from where: 02/10/2021 [facility] How have you been since you were released from the hospital? Had a fall Any questions or concerns? Yes. Says having trouble getting around, but she is seeing the neurologist next week  Items Reviewed: Did the pt receive and understand the discharge instructions provided? Yes  Medications obtained and verified? Yes  Other? No  Any new allergies since your discharge? No  Dietary orders reviewed? Yes Do you have support at home? Yes   Home Care and Equipment/Supplies: Were home health services ordered? no If so, what is the name of the agency? N/a  Has the agency set up a time to come to the patient's home? not applicable Were any new equipment or medical supplies ordered?  No What is the name of the medical supply agency? N/a Were you able to get the supplies/equipment? not applicable Do you have any questions related to the use of the equipment or supplies? No  Functional Questionnaire: (I = Independent and D = Dependent) ADLs: I  Bathing/Dressing- I  Meal Prep- I  Eating- I  Maintaining continence- I  Transferring/Ambulation- I  Managing Meds- I  Follow up appointments reviewed:  PCP Hospital f/u appt confirmed? Yes  Scheduled to see [name] NP  on 02/20/2021 @ 3:00. Are transportation arrangements needed? No  If their condition worsens, is the pt aware to call PCP or go to the Emergency Dept.? Yes Was the patient provided with contact information for the PCP's office or ED? Yes Was to pt encouraged to call back with questions or concerns? Yes

## 2021-02-16 ENCOUNTER — Encounter: Payer: Self-pay | Admitting: Oncology

## 2021-02-20 ENCOUNTER — Other Ambulatory Visit: Payer: Self-pay

## 2021-02-20 ENCOUNTER — Encounter: Payer: Self-pay | Admitting: Nurse Practitioner

## 2021-02-20 ENCOUNTER — Ambulatory Visit (INDEPENDENT_AMBULATORY_CARE_PROVIDER_SITE_OTHER): Payer: Self-pay | Admitting: Nurse Practitioner

## 2021-02-20 VITALS — BP 110/72 | HR 91 | Temp 98.8°F | Wt 119.0 lb

## 2021-02-20 DIAGNOSIS — G459 Transient cerebral ischemic attack, unspecified: Secondary | ICD-10-CM

## 2021-02-20 DIAGNOSIS — N179 Acute kidney failure, unspecified: Secondary | ICD-10-CM

## 2021-02-20 DIAGNOSIS — K529 Noninfective gastroenteritis and colitis, unspecified: Secondary | ICD-10-CM

## 2021-02-20 DIAGNOSIS — E119 Type 2 diabetes mellitus without complications: Secondary | ICD-10-CM

## 2021-02-20 DIAGNOSIS — Z09 Encounter for follow-up examination after completed treatment for conditions other than malignant neoplasm: Secondary | ICD-10-CM

## 2021-02-20 HISTORY — DX: Transient cerebral ischemic attack, unspecified: G45.9

## 2021-02-20 LAB — SPECIMEN STATUS REPORT

## 2021-02-20 LAB — CLOSTRIDIUM DIFFICILE BY PCR

## 2021-02-20 MED ORDER — METFORMIN HCL 1000 MG PO TABS: ORAL_TABLET | ORAL | 1 refills | Status: DC

## 2021-02-20 MED ORDER — GLIPIZIDE 5 MG PO TABS: ORAL_TABLET | ORAL | 1 refills | Status: DC

## 2021-02-20 NOTE — Assessment & Plan Note (Signed)
Most recent A1c is 11. Patient will have insurance soon. Will adjust medications once insurance information is updated. Refills sent today.

## 2021-02-20 NOTE — Progress Notes (Signed)
BP 110/72   Pulse 91   Temp 98.8 F (37.1 C)   Wt 119 lb (54 kg)   LMP  (LMP Unknown)   SpO2 95%   BMI 22.48 kg/m    Subjective:    Patient ID: Amy Hansen, female    DOB: [birth year], 65 y.o.   MRN: [id number]  HPI: Amy Hansen is a 65 y.o. female  Chief Complaint  Patient presents with   Hospitalization Follow-up   Paralysis    Rt foot, unable to lift foot and big toe, causing falls. Had a TIA while in the hospital late 2021   Transition of [facility] Follow up.   [facility] Physician: 02/10/2021 D/C Date: 02/10/2021  Records Requested: None Records Received: None Records Reviewed: Reviewed visit from [facility]  Diagnoses on Discharge: Colitis flare  Date of interactive Contact within 48 hours of discharge: yes Contact was through: phone  Date of 7 day or 14 day face-to-face visit:    within 7 days  Outpatient Encounter Medications as of 02/20/2021  Medication Sig Note   ALPRAZolam (XANAX) 0.5 MG tablet TAKE 1 TABLET BY MOUTH 4 TIMES DAILY AS NEEDED FOR ANXIETY    buPROPion (WELLBUTRIN XL) 150 MG 24 hr tablet Take 3 tablets by mouth once daily    Cholecalciferol (VITAMIN D) 50 MCG (2000 UT) CAPS Take 1 capsule (2,000 Units total) by mouth daily.    furosemide (LASIX) 20 MG tablet Take 1 tablet (11m) daily. Take an additional tablet (232m AS NEEDED.    glipiZIDE (GLUCOTROL) 5 MG tablet Take 1 tablet by mouth twice daily.    insulin glargine, 2 Unit Dial, (TOUJEO MAX SOLOSTAR) 300 UNIT/ML Solostar Pen Inject 24 units daily. Titrate as instructed. Max daily dose 40 units    Insulin Pen Needle (PEN NEEDLES) 32G X 4 MM MISC 1 Units by Does not apply route at bedtime.    lamoTRIgine (LAMICTAL) 200 MG tablet Take 2 tablets (400 mg total) by mouth at bedtime.    loperamide (IMODIUM) 2 MG capsule Take 1 capsule (2 mg total) by mouth as needed for diarrhea or loose stools.    metFORMIN (GLUCOPHAGE) 1000 MG tablet Take 1 tablet by mouth twice  daily.    ondansetron (ZOFRAN) 4 MG tablet Take 1 tablet (4 mg total) by mouth daily as needed for nausea or vomiting.    oxyCODONE-acetaminophen (PERCOCET) 5-325 MG tablet Take 1 tablet by mouth every 4 (four) hours as needed for severe pain.    pramipexole (MIRAPEX) 1 MG tablet Take 1 tablet (1 mg total) by mouth at bedtime.    predniSONE (DELTASONE) 10 MG tablet Take 1 tablet (10 mg total) by mouth daily.    predniSONE (DELTASONE) 10 MG tablet Take 1 tablet (10 mg total) by mouth daily.    predniSONE (DELTASONE) 20 MG tablet Take 2 tablets (40 mg total) by mouth daily with breakfast for 14 days.    predniSONE (DELTASONE) 20 MG tablet Take 2 tablets (40 mg total) by mouth daily with breakfast for 14 days.    risperiDONE (RISPERDAL) 3 MG tablet Take 1 tablet (3 mg total) by mouth at bedtime.    sertraline (ZOLOFT) 100 MG tablet TAKE 1 & 1/2 (ONE & ONE-HALF) TABLETS BY MOUTH ONCE DAILY 02/09/2021: Per pt was bumped up to 15024mor a time but has been down to 100m44mr months per MD   [DISCONTINUED] glipiZIDE (GLUCOTROL) 5 MG tablet TAKE 1 TABLET BY MOUTH TWICE  DAILY WITH MEALS (APPOINTMENT  REQUIRED  FOR  FUTURE  REFILLS)    [DISCONTINUED] metFORMIN (GLUCOPHAGE) 1000 MG tablet TAKE 1 TABLET BY MOUTH TWICE DAILY WITH MEALS (APPOINTMENT  REQUIRED  FOR  FUTURE  REFILLS)    No facility-administered encounter medications on file as of 02/20/2021.    Diagnostic Tests Reviewed/Disposition: Reviewed   Consults: GI  Discharge Instructions- sent home on oral prednisone. Antibiotics stopped.  Disease/illness Education: provided.  Home Health/Community Services Discussions/Referrals: already in contact with CCM team  Establishment or re-establishment of referral orders for community resources: yes  Discussion with other health care providers: Ongoing follow up with GI.  Assessment and Support of treatment regimen adherence: provided  Appointments Coordinated with: She will follow up with [facility]  in two weeks  Education for self-management, independent living, and ADLs: provided  Colitis Given IV Solu-Medrol and vancomycin.  C. difficile testing was negative, antibiotics were stopped. The patient symptoms improved, she was discharged with prednisone 40 mg daily and outpatient GI follow-up  Hyponatremia Improved with fluids  Hypokalemia Resolved with supplementation  Acute blood loss anemia Hemoglobin dropped from 12-9, possibly delusional, may be some hematochezia, no need for transfusion  Diabetes  Moderate protein calorie malnutrition  Patient states that she isn't able to bend and flex her right foot like she used to.  She states it is dragging on the ground.  She has fallen twice because of this.  She as been tripping often but able to catch herself. She is having to be intentional about picking her leg up in order to keep herself from falling.  She is having some swelling and numbness in her right foot.  She feels like she can't feel her foot at times.   Relevant past medical, surgical, family and social history reviewed and updated as indicated. Interim medical history since our last visit reviewed. Allergies and medications reviewed and updated.  Review of Systems  Musculoskeletal:  Positive for gait problem.       Swelling in foot  Neurological:  Positive for numbness.   Per HPI unless specifically indicated above     Objective:    BP 110/72   Pulse 91   Temp 98.8 F (37.1 C)   Wt 119 lb (54 kg)   LMP  (LMP Unknown)   SpO2 95%   BMI 22.48 kg/m   Wt Readings from Last 3 Encounters:  02/20/21 119 lb (54 kg)  02/08/21 130 lb 1.1 oz (59 kg)  01/06/21 130 lb (59 kg)    Physical Exam Vitals and nursing note reviewed.  Constitutional:      General: She is not in acute distress.    Appearance: Normal appearance. She is normal weight. She is not ill-appearing, toxic-appearing or diaphoretic.  HENT:     Head: Normocephalic.     Right Ear: External ear  normal.     Left Ear: External ear normal.     Nose: Nose normal.     Mouth/Throat:     Mouth: Mucous membranes are moist.     Pharynx: Oropharynx is clear.  Eyes:     General:        Right eye: No discharge.        Left eye: No discharge.     Extraocular Movements: Extraocular movements intact.     Conjunctiva/sclera: Conjunctivae normal.     Pupils: Pupils are equal, round, and reactive to light.  Cardiovascular:     Rate and Rhythm: Normal rate and regular  rhythm.     Heart sounds: No murmur heard. Pulmonary:     Effort: Pulmonary effort is normal. No respiratory distress.     Breath sounds: Normal breath sounds. No wheezing or rales.  Musculoskeletal:     Cervical back: Normal range of motion and neck supple.  Skin:    General: Skin is warm and dry.     Capillary Refill: Capillary refill takes less than 2 seconds.  Neurological:     General: No focal deficit present.     Mental Status: She is alert and oriented to person, place, and time. Mental status is at baseline.  Psychiatric:        Mood and Affect: Mood normal.        Behavior: Behavior normal.        Thought Content: Thought content normal.        Judgment: Judgment normal.    Results for orders placed or performed during the hospital encounter of 02/08/21  Urine Culture   Specimen: Urine, Clean Catch  Result Value Ref Range   Specimen Description URINE, CLEAN CATCH    Special Requests      NONE Performed at [facility], [place] [address], [place], [place] [address]    Culture >=100,000 COLONIES/mL ESCHERICHIA COLI (A)    Report Status 02/12/2021 FINAL    Organism ID, Bacteria ESCHERICHIA COLI (A)       Susceptibility   Escherichia coli - MIC*    AMPICILLIN >=32 RESISTANT Resistant     CEFAZOLIN <=4 SENSITIVE Sensitive     CEFEPIME <=0.12 SENSITIVE Sensitive     CEFTRIAXONE <=0.25 SENSITIVE Sensitive     CIPROFLOXACIN 1 SENSITIVE Sensitive     GENTAMICIN <=1 SENSITIVE Sensitive     IMIPENEM <=0.25  SENSITIVE Sensitive     NITROFURANTOIN <=16 SENSITIVE Sensitive     TRIMETH/SULFA <=20 SENSITIVE Sensitive     AMPICILLIN/SULBACTAM >=32 RESISTANT Resistant     PIP/TAZO <=4 SENSITIVE Sensitive     * >=100,000 COLONIES/mL ESCHERICHIA COLI  Gastrointestinal Panel by PCR , Stool   Specimen: Stool  Result Value Ref Range   Campylobacter species NOT DETECTED NOT DETECTED   Plesimonas shigelloides NOT DETECTED NOT DETECTED   Salmonella species NOT DETECTED NOT DETECTED   Yersinia enterocolitica NOT DETECTED NOT DETECTED   Vibrio species NOT DETECTED NOT DETECTED   Vibrio cholerae NOT DETECTED NOT DETECTED   Enteroaggregative E coli (EAEC) NOT DETECTED NOT DETECTED   Enteropathogenic E coli (EPEC) NOT DETECTED NOT DETECTED   Enterotoxigenic E coli (ETEC) NOT DETECTED NOT DETECTED   Shiga like toxin producing E coli (STEC) NOT DETECTED NOT DETECTED   Shigella/Enteroinvasive E coli (EIEC) NOT DETECTED NOT DETECTED   Cryptosporidium NOT DETECTED NOT DETECTED   Cyclospora cayetanensis NOT DETECTED NOT DETECTED   Entamoeba histolytica NOT DETECTED NOT DETECTED   Giardia lamblia NOT DETECTED NOT DETECTED   Adenovirus F40/41 NOT DETECTED NOT DETECTED   Astrovirus NOT DETECTED NOT DETECTED   Norovirus GI/GII NOT DETECTED NOT DETECTED   Rotavirus A NOT DETECTED NOT DETECTED   Sapovirus (I, II, IV, and V) NOT DETECTED NOT DETECTED  C Difficile Quick Screen w PCR reflex   Specimen: Stool  Result Value Ref Range   C Diff antigen NEGATIVE NEGATIVE   C Diff toxin NEGATIVE NEGATIVE   C Diff interpretation No C. difficile detected.   Culture, blood (routine x 2)   Specimen: BLOOD  Result Value Ref Range   Specimen Description BLOOD  SITE NOT SPECIFIED    Special Requests      BOTTLES DRAWN AEROBIC AND ANAEROBIC Blood Culture results may not be optimal due to an excessive volume of blood received in culture bottles   Culture      NO GROWTH 5 DAYS Performed at [facility] [address], [place], [place] [address]    Report Status 02/13/2021 FINAL   Culture, blood (routine x 2)   Specimen: BLOOD  Result Value Ref Range   Specimen Description BLOOD SITE NOT SPECIFIED    Special Requests      BOTTLES DRAWN AEROBIC AND ANAEROBIC Blood Culture results may not be optimal due to an excessive volume of blood received in culture bottles   Culture      NO GROWTH 5 DAYS Performed at [facility] [address] [place], [place] [address]    Report Status 02/13/2021 FINAL   SARS CORONAVIRUS 2 (TAT 6-24 HRS) Nasopharyngeal Nasopharyngeal Swab   Specimen: Nasopharyngeal Swab  Result Value Ref Range   SARS Coronavirus 2 NEGATIVE NEGATIVE  Lipase, blood  Result Value Ref Range   Lipase 21 11 - 51 U/L  Comprehensive metabolic panel  Result Value Ref Range   Sodium 126 (L) 135 - 145 mmol/L   Potassium 3.5 3.5 - 5.1 mmol/L   Chloride 93 (L) 98 - 111 mmol/L   CO2 21 (L) 22 - 32 mmol/L   Glucose, Bld 453 (H) 70 - 99 mg/dL   BUN 5 (L) 8 - 23 mg/dL   Creatinine, Ser 0.70 0.44 - 1.00 mg/dL   Calcium 8.3 (L) 8.9 - 10.3 mg/dL   Total Protein 6.5 6.5 - 8.1 g/dL   Albumin 2.3 (L) 3.5 - 5.0 g/dL   AST 9 (L) 15 - 41 U/L   ALT 11 0 - 44 U/L   Alkaline Phosphatase 130 (H) 38 - 126 U/L   Total Bilirubin 0.5 0.3 - 1.2 mg/dL   GFR, Estimated >60 >60 mL/min   Anion gap 12 5 - 15  CBC  Result Value Ref Range   WBC 16.3 (H) 4.0 - 10.5 K/uL   RBC 4.47 3.87 - 5.11 MIL/uL   Hemoglobin 12.0 12.0 - 15.0 g/dL   HCT 38.0 36.0 - 46.0 %   MCV 85.0 80.0 - 100.0 fL   MCH 26.8 26.0 - 34.0 pg   MCHC 31.6 30.0 - 36.0 g/dL   RDW 14.6 11.5 - 15.5 %   Platelets 639 (H) 150 - 400 K/uL   nRBC 0.0 0.0 - 0.2 %  Urinalysis, Routine w reflex microscopic Urine, Clean Catch  Result Value Ref Range   Color, Urine YELLOW YELLOW   APPearance HAZY (A) CLEAR   Specific Gravity, Urine 1.021 1.005 - 1.030   pH 6.0 5.0 - 8.0   Glucose, UA >=500 (A) NEGATIVE mg/dL   Hgb urine dipstick NEGATIVE NEGATIVE    Bilirubin Urine NEGATIVE NEGATIVE   Ketones, ur 5 (A) NEGATIVE mg/dL   Protein, ur NEGATIVE NEGATIVE mg/dL   Nitrite POSITIVE (A) NEGATIVE   Leukocytes,Ua SMALL (A) NEGATIVE   RBC / HPF 0-5 0 - 5 RBC/hpf   WBC, UA 6-10 0 - 5 WBC/hpf   Bacteria, UA RARE (A) NONE SEEN   Squamous Epithelial / LPF 0-5 0 - 5   Mucus PRESENT   Lactic acid, plasma  Result Value Ref Range   Lactic Acid, Venous 3.2 (HH) 0.5 - 1.9 mmol/L  Lactic acid, plasma  Result  Value Ref Range   Lactic Acid, Venous 0.9 0.5 - 1.9 mmol/L  Hemoglobin A1c  Result Value Ref Range   Hgb A1c MFr Bld 11.7 (H) 4.8 - 5.6 %   Mean Plasma Glucose 289.09 mg/dL  CBC  Result Value Ref Range   WBC 8.6 4.0 - 10.5 K/uL   RBC 3.57 (L) 3.87 - 5.11 MIL/uL   Hemoglobin 9.7 (L) 12.0 - 15.0 g/dL   HCT 30.2 (L) 36.0 - 46.0 %   MCV 84.6 80.0 - 100.0 fL   MCH 27.2 26.0 - 34.0 pg   MCHC 32.1 30.0 - 36.0 g/dL   RDW 14.4 11.5 - 15.5 %   Platelets 496 (H) 150 - 400 K/uL   nRBC 0.0 0.0 - 0.2 %  Basic metabolic panel  Result Value Ref Range   Sodium 135 135 - 145 mmol/L   Potassium 3.0 (L) 3.5 - 5.1 mmol/L   Chloride 104 98 - 111 mmol/L   CO2 25 22 - 32 mmol/L   Glucose, Bld 221 (H) 70 - 99 mg/dL   BUN <5 (L) 8 - 23 mg/dL   Creatinine, Ser 0.51 0.44 - 1.00 mg/dL   Calcium 7.9 (L) 8.9 - 10.3 mg/dL   GFR, Estimated >60 >60 mL/min   Anion gap 6 5 - 15  Glucose, capillary  Result Value Ref Range   Glucose-Capillary 297 (H) 70 - 99 mg/dL  Vitamin B12  Result Value Ref Range   Vitamin B-12 1,067 (H) 180 - 914 pg/mL  Glucose, capillary  Result Value Ref Range   Glucose-Capillary 306 (H) 70 - 99 mg/dL  Glucose, capillary  Result Value Ref Range   Glucose-Capillary 229 (H) 70 - 99 mg/dL  CBC  Result Value Ref Range   WBC 5.9 4.0 - 10.5 K/uL   RBC 3.53 (L) 3.87 - 5.11 MIL/uL   Hemoglobin 9.5 (L) 12.0 - 15.0 g/dL   HCT 30.1 (L) 36.0 - 46.0 %   MCV 85.3 80.0 - 100.0 fL   MCH 26.9 26.0 - 34.0 pg   MCHC 31.6 30.0 - 36.0 g/dL   RDW 14.6  11.5 - 15.5 %   Platelets 531 (H) 150 - 400 K/uL   nRBC 0.0 0.0 - 0.2 %  Glucose, capillary  Result Value Ref Range   Glucose-Capillary 126 (H) 70 - 99 mg/dL  Glucose, capillary  Result Value Ref Range   Glucose-Capillary 158 (H) 70 - 99 mg/dL      Assessment & Plan:   Problem List Items Addressed This Visit       Cardiovascular and Mediastinum   TIA (transient ischemic attack)    History of TIA in October 2021. Thinks she has had one since then but not sure when. Has not seen Neurology due to not having insurance. Does have an upcoming appointment with neurology to evaluate foot concern and previous TIAs.         Digestive   Colitis    Continues on prednisone. Follow up with GI in 2 weeks. Labs ordered today.        Relevant Orders   Comp Met (CMET)   CBC w/Diff     Endocrine   Type 2 diabetes mellitus ([place])    Most recent A1c is 11. Patient will have insurance soon. Will adjust medications once insurance information is updated. Refills sent today.        Relevant Medications   metFORMIN (GLUCOPHAGE) 1000 MG tablet   glipiZIDE (GLUCOTROL) 5 MG tablet  Genitourinary   AKI (acute kidney injury) ([place])    Labs ordered at visit today. Will make recommendations based on lab results.        Relevant Orders   Comp Met (CMET)   CBC w/Diff   Other Visit Diagnoses     Hospital discharge follow-up    -  Primary   Symptoms have improved. Has follow up with GI scheduled. Ordered CMP/CBC today. Patient needs appointment with neurology for foot concern.        Follow up plan: Return in about 2 months (around 04/23/2021) for HTN, HLD, DM2 FU.

## 2021-02-20 NOTE — Assessment & Plan Note (Signed)
Continues on prednisone. Follow up with GI in 2 weeks. Labs ordered today.

## 2021-02-20 NOTE — Assessment & Plan Note (Signed)
History of TIA in October 2021. Thinks she has had one since then but not sure when. Has not seen Neurology due to not having insurance. Does have an upcoming appointment with neurology to evaluate foot concern and previous TIAs.

## 2021-02-20 NOTE — Assessment & Plan Note (Signed)
Labs ordered at visit today. Will make recommendations based on lab results.

## 2021-02-21 LAB — COMPREHENSIVE METABOLIC PANEL
ALT: 49 IU/L — ABNORMAL HIGH (ref 0–32)
AST: 76 IU/L — ABNORMAL HIGH (ref 0–40)
Albumin/Globulin Ratio: 1.3 (ref 1.2–2.2)
Albumin: 3.4 g/dL — ABNORMAL LOW (ref 3.8–4.8)
Alkaline Phosphatase: 122 IU/L — ABNORMAL HIGH (ref 44–121)
BUN/Creatinine Ratio: 8 — ABNORMAL LOW (ref 12–28)
BUN: 5 mg/dL — ABNORMAL LOW (ref 8–27)
Bilirubin Total: <0.2 mg/dL (ref 0.0–1.2)
CO2: 26 mmol/L (ref 20–29)
Calcium: 8.8 mg/dL (ref 8.7–10.3)
Chloride: 96 mmol/L (ref 96–106)
Creatinine, Ser: 0.6 mg/dL (ref 0.57–1.00)
Globulin, Total: 2.7 g/dL (ref 1.5–4.5)
Glucose: 222 mg/dL — ABNORMAL HIGH (ref 65–99)
Potassium: 4.7 mmol/L (ref 3.5–5.2)
Sodium: 135 mmol/L (ref 134–144)
Total Protein: 6.1 g/dL (ref 6.0–8.5)
eGFR: 101 mL/min/{1.73_m2} (ref >59)

## 2021-02-21 LAB — CBC WITH DIFFERENTIAL/PLATELET
Basophils Absolute: 0 10*3/uL (ref 0.0–0.2)
Basos: 0 %
EOS (ABSOLUTE): 0 10*3/uL (ref 0.0–0.4)
Eos: 0 %
Hematocrit: 35.7 % (ref 34.0–46.6)
Hemoglobin: 11.2 g/dL (ref 11.1–15.9)
Immature Grans (Abs): 0.1 10*3/uL (ref 0.0–0.1)
Immature Granulocytes: 1 %
Lymphocytes Absolute: 2.3 10*3/uL (ref 0.7–3.1)
Lymphs: 28 %
MCH: 26.3 pg — ABNORMAL LOW (ref 26.6–33.0)
MCHC: 31.4 g/dL — ABNORMAL LOW (ref 31.5–35.7)
MCV: 84 fL (ref 79–97)
Monocytes Absolute: 0.5 10*3/uL (ref 0.1–0.9)
Monocytes: 6 %
Neutrophils Absolute: 5.3 10*3/uL (ref 1.4–7.0)
Neutrophils: 65 %
Platelets: 491 10*3/uL — ABNORMAL HIGH (ref 150–450)
RBC: 4.26 x10E6/uL (ref 3.77–5.28)
RDW: 14.8 % (ref 11.7–15.4)
WBC: 8.2 10*3/uL (ref 3.4–10.8)

## 2021-02-21 NOTE — Progress Notes (Signed)
Hi Amy Hansen.  Overall your lab work is improving. Please speak with your GI doctor about your elevated liver enzymes. They are slightly elevated and we will continue to monitor them. Your complete blood count is continuing to improve which is great news. Your white blood count has returned to normal.  Please let me know if you have any questions.

## 2021-02-22 ENCOUNTER — Ambulatory Visit: Payer: Self-pay | Admitting: *Deleted

## 2021-02-22 ENCOUNTER — Encounter: Payer: Self-pay | Admitting: *Deleted

## 2021-02-22 ENCOUNTER — Other Ambulatory Visit: Payer: Self-pay | Admitting: Family Medicine

## 2021-02-22 DIAGNOSIS — E1165 Type 2 diabetes mellitus with hyperglycemia: Secondary | ICD-10-CM

## 2021-02-22 MED ORDER — TOUJEO MAX SOLOSTAR 300 UNIT/ML ~~LOC~~ SOPN: PEN_INJECTOR | SUBCUTANEOUS | 1 refills | Status: DC

## 2021-02-22 NOTE — Telephone Encounter (Signed)
Patient notified

## 2021-02-22 NOTE — Telephone Encounter (Signed)
Patient should inject 30u once daily and increase by 2 units every other day until her sugars are less than 150 fasting.  If she develops symptoms of hyperglycemia she should be seen in the ER.

## 2021-02-22 NOTE — Telephone Encounter (Signed)
Reason for Disposition  [1] Caller has URGENT medication or insulin pump question AND [2] triager unable to answer question  Answer Assessment - Initial Assessment Questions 1. BLOOD GLUCOSE: "What is your blood glucose level?"      420 2. ONSET: "When did you check the blood glucose?"     3 hours ago- 577 3. USUAL RANGE: "What is your glucose level usually?" (e.g., usual fasting morning value, usual evening value)     Usually in 200 4. KETONES: "Do you check for ketones (urine or blood test strips)?" If yes, ask: "What does the test show now?"      N/a 5. TYPE 1 or 2:  "Do you know what type of diabetes you have?"  (e.g., Type 1, Type 2, Gestational; doesn't know)      Type 2 6. INSULIN: "Do you take insulin?" "What type of insulin(s) do you use? What is the mode of delivery? (syringe, pen; injection or pump)?"      Toujeo- patient is using prednisone and that is making her glucose high- patient has used 20 units- earlier 10 units twice 7. DIABETES PILLS: "Do you take any pills for your diabetes?" If yes, ask: "Have you missed taking any pills recently?"     Metformin and glipizide 8. OTHER SYMPTOMS: "Do you have any symptoms?" (e.g., fever, frequent urination, difficulty breathing, dizziness, weakness, vomiting)     Vision changes, nausea 9. PREGNANCY: "Is there any chance you are pregnant?" "When was your last menstrual period?"     N/a  Protocols used: Diabetes - High Blood Sugar-A-AH

## 2021-02-22 NOTE — Telephone Encounter (Signed)
  Notes to clinic:  Requested script has expired  Review for continued use    Requested Prescriptions  Pending Prescriptions Disp Refills   TOUJEO MAX SOLOSTAR 300 UNIT/ML Solostar Pen [Pharmacy Med Name: Lenon Curt SoloStar 300 UNIT/ML Subcutaneous Solution Pen-injector] 6 mL 0    Sig: INJECT 24 UNITS SUBCUTANEOUSLY ONCE DAILY TITRATE  AS  INSTRUCTED  MAX  DAILY  DOSE  OF  40  UNITS      Endocrinology:  Diabetes - Insulins Failed - 02/22/2021  3:13 PM      Failed - HBA1C is between 0 and 7.9 and within 180 days    Hemoglobin A1C  Date Value Ref Range Status  03/21/2016 7.3%  Final   HB A1C (BAYER DCA - WAIVED)  Date Value Ref Range Status  10/27/2020 8.7 (H) <7.0 % Final    Comment:                                          Diabetic Adult            <7.0                                       Healthy Adult        4.3 - 5.7                                                           (DCCT/NGSP) American Diabetes Association's Summary of Glycemic Recommendations for Adults with Diabetes: Hemoglobin A1c <7.0%. More stringent glycemic goals (A1c <6.0%) may further reduce complications at the cost of increased risk of hypoglycemia.    Hgb A1c MFr Bld  Date Value Ref Range Status  02/09/2021 11.7 (H) 4.8 - 5.6 % Final    Comment:    (NOTE) Pre diabetes:          5.7%-6.4%  Diabetes:              >6.4%  Glycemic control for   <7.0% adults with diabetes           Passed - Valid encounter within last 6 months    Recent Outpatient Visits           2 days ago Hospital discharge follow-up   [facility] [name], NP   3 months ago UTI (urinary tract infection), bacterial   [facility] [name], NP   3 months ago Poorly controlled type 2 diabetes mellitus ([place])   [facility] [name], NP   9 months ago Poorly controlled type 2 diabetes mellitus ([place])   [place] Eulogio Bear, NP   9 months  ago Hospital discharge follow-up   [facility] Eulogio Bear, NP

## 2021-02-22 NOTE — Telephone Encounter (Signed)
Please send in a refill

## 2021-02-22 NOTE — Addendum Note (Signed)
Addended by: [name] on: 02/22/2021 04:31 PM   Modules accepted: Orders

## 2021-02-22 NOTE — Telephone Encounter (Signed)
Patient states she has done 2- 20 units and then 1 - 10 units. Patient states she down to 340. Patient denies having any of the symptoms listed in the prior message. Please advise?

## 2021-02-22 NOTE — Telephone Encounter (Signed)
Possible duplicate 

## 2021-02-22 NOTE — Telephone Encounter (Signed)
Patient is calling to report she is having elevated glucose readings- patient is on taper of prednisone and she will be on this medication for several weeks. Patient only has Trujao to use for insulin control(she does take orals as well). Patient has been dosing herself with increased units of Trujao. Call to office to let PCP know that patient may need additional/different insulin during this time. Patient has been made aware also- patient is requesting samples until Rx can be filled by pharmacy if necessary.

## 2021-02-22 NOTE — Telephone Encounter (Signed)
Can we please find out what dose of the toujeo she is giving herself? I can't adjust it unless I know what she is doing.   Also, if she is having symptoms of hyperglycemia such as Fast, deep breathing, Dry skin and mouth, flushed face, fruity-smelling breath, Headache, Muscle stiffness or aches, Being very tired, Nausea and vomiting.

## 2021-02-22 NOTE — Telephone Encounter (Signed)
Refill sent.

## 2021-02-22 NOTE — Telephone Encounter (Signed)
Please advise 

## 2021-02-23 ENCOUNTER — Telehealth: Payer: Self-pay

## 2021-02-23 ENCOUNTER — Ambulatory Visit: Payer: Self-pay | Admitting: Physician Assistant

## 2021-02-23 NOTE — Chronic Care Management (AMB) (Signed)
  Chronic Care Management   Note  02/23/2021 Name: Amy Hansen MRN: [id number] DOB: Nov 25, 1955  Amy Hansen is a 65 y.o. year old female who is a primary care patient of [name], NP. I reached out to [name] by phone today in response to a referral sent by [name]'s health plan.     [name] was given information about Chronic Care Management services today including:  CCM service includes personalized support from designated clinical staff supervised by her physician, including individualized plan of care and coordination with other care providers 24/7 contact phone numbers for assistance for urgent and routine care needs. Service will only be billed when office clinical staff spend 20 minutes or more in a month to coordinate care. Only one practitioner may furnish and bill the service in a calendar month. The patient may stop CCM services at any time (effective at the end of the month) by phone call to the office staff. The patient will be responsible for cost sharing (co-pay) of up to 20% of the service fee (after annual deductible is met).  Patient agreed to services and verbal consent obtained.   Follow up plan: Telephone appointment with care management team member scheduled for:02/28/2021  Noreene Larsson, [place], [place], [place] [address] Direct Dial: [phone] [email or web address]: [email or web address]

## 2021-02-23 NOTE — Telephone Encounter (Signed)
Copied from [employer] [id number]. Topic: General - Other >> Feb 23, 2021  8:13 AM Valere Dross wrote: Reason for CRM: Pt called in about having a previous Saving card to pay for medication, and she was told by pharmacy to contact office to get another one, please advise.

## 2021-02-23 NOTE — Telephone Encounter (Signed)
Spoke with patient and advised her our office is currently out of the savings cards for prescription Tou[place]. I advised patient to try logging onto the [employer] and put her personal information to apply for the savings card and if the patient has any questions to give our office a call back. Patient verbalized understanding.

## 2021-02-28 ENCOUNTER — Telehealth: Payer: Self-pay

## 2021-02-28 ENCOUNTER — Telehealth: Payer: Self-pay | Admitting: General Practice

## 2021-02-28 NOTE — Telephone Encounter (Signed)
  Care Management   Follow Up Note   02/28/2021 Name: Amy Hansen MRN: [id number] DOB: [birth year]   Referred by: [name], NP Reason for referral : Care Coordination (RNCM; Follow up for Chronic Disease Management and Care Coordination Needs )   An unsuccessful telephone outreach was attempted today. The patient was referred to the case management team for assistance with care management and care coordination. Called the patient back and she was at the grocery store. She ask the [facility] if she could call the RNCM back. Agreed. The patient has not called back.   Follow Up Plan: A HIPPA compliant phone message was left for the patient providing contact information and requesting a return call.   Noreene Larsson RN, MSN, [facility] Mobile: [phone]

## 2021-03-01 ENCOUNTER — Encounter: Payer: Self-pay | Admitting: General Practice

## 2021-03-01 ENCOUNTER — Ambulatory Visit: Payer: Self-pay | Admitting: General Practice

## 2021-03-01 ENCOUNTER — Telehealth: Payer: Self-pay | Admitting: General Practice

## 2021-03-01 DIAGNOSIS — I10 Essential (primary) hypertension: Secondary | ICD-10-CM

## 2021-03-01 DIAGNOSIS — K529 Noninfective gastroenteritis and colitis, unspecified: Secondary | ICD-10-CM

## 2021-03-01 DIAGNOSIS — R296 Repeated falls: Secondary | ICD-10-CM

## 2021-03-01 DIAGNOSIS — F329 Major depressive disorder, single episode, unspecified: Secondary | ICD-10-CM

## 2021-03-01 DIAGNOSIS — E1165 Type 2 diabetes mellitus with hyperglycemia: Secondary | ICD-10-CM

## 2021-03-01 NOTE — Chronic Care Management (AMB) (Signed)
Care Management    RN Visit Note  03/01/2021 Name: Amy Hansen MRN: [id number] DOB: July 22, 1956  Subjective: Amy Hansen is a 65 y.o. year old female who is a primary care patient of [name], NP. The care management team was consulted for assistance with disease management and care coordination needs.    Engaged with patient by telephone for follow up visit in response to provider referral for case management and/or care coordination services.   Consent to Services:   Ms. Sinha was given information about Care Management services today including:  Care Management services includes personalized support from designated clinical staff supervised by her physician, including individualized plan of care and coordination with other care providers 24/7 contact phone numbers for assistance for urgent and routine care needs. The patient may stop case management services at any time by phone call to the office staff.  Patient agreed to services and consent obtained.   Assessment: Review of patient past medical history, allergies, medications, health status, including review of consultants reports, laboratory and other test data, was performed as part of comprehensive evaluation and provision of chronic care management services.   SDOH (Social Determinants of Health) assessments and interventions performed:    Care Plan  Allergies  Allergen Reactions   Avelox [Moxifloxacin Hcl In Nacl] Anaphylaxis   Bactrim [Sulfamethoxazole-Trimethoprim] Anaphylaxis   Ciprofloxacin Other (See Comments)    Pt states she was told never to take this as it is in the same family as Avelox.    Depakote [Divalproex Sodium] Other (See Comments)    Unknown Reaction   Imitrex [Sumatriptan] Other (See Comments)    Neck and shoulder pain   Stadol [Butorphanol] Rash    Outpatient Encounter Medications as of 03/01/2021  Medication Sig Note   ALPRAZolam (XANAX) 0.5 MG tablet TAKE 1 TABLET BY MOUTH 4 TIMES  DAILY AS NEEDED FOR ANXIETY    buPROPion (WELLBUTRIN XL) 150 MG 24 hr tablet Take 3 tablets by mouth once daily    Cholecalciferol (VITAMIN D) 50 MCG (2000 UT) CAPS Take 1 capsule (2,000 Units total) by mouth daily.    furosemide (LASIX) 20 MG tablet Take 1 tablet (82m) daily. Take an additional tablet (263m AS NEEDED.    glipiZIDE (GLUCOTROL) 5 MG tablet Take 1 tablet by mouth twice daily.    insulin glargine, 2 Unit Dial, (TOUJEO MAX SOLOSTAR) 300 UNIT/ML Solostar Pen Inject 24 units daily. Titrate as instructed. Max daily dose 40 units    Insulin Pen Needle (PEN NEEDLES) 32G X 4 MM MISC 1 Units by Does not apply route at bedtime.    lamoTRIgine (LAMICTAL) 200 MG tablet Take 2 tablets (400 mg total) by mouth at bedtime.    loperamide (IMODIUM) 2 MG capsule Take 1 capsule (2 mg total) by mouth as needed for diarrhea or loose stools.    metFORMIN (GLUCOPHAGE) 1000 MG tablet Take 1 tablet by mouth twice daily.    ondansetron (ZOFRAN) 4 MG tablet Take 1 tablet (4 mg total) by mouth daily as needed for nausea or vomiting.    oxyCODONE-acetaminophen (PERCOCET) 5-325 MG tablet Take 1 tablet by mouth every 4 (four) hours as needed for severe pain.    pramipexole (MIRAPEX) 1 MG tablet Take 1 tablet (1 mg total) by mouth at bedtime.    predniSONE (DELTASONE) 10 MG tablet Take 1 tablet (10 mg total) by mouth daily.    predniSONE (DELTASONE) 10 MG tablet Take 1 tablet (10 mg total) by mouth daily.  risperiDONE (RISPERDAL) 3 MG tablet Take 1 tablet (3 mg total) by mouth at bedtime.    sertraline (ZOLOFT) 100 MG tablet TAKE 1 & 1/2 (ONE & ONE-HALF) TABLETS BY MOUTH ONCE DAILY 02/09/2021: Per pt was bumped up to 153m for a time but has been down to 1062mfor months per MD   No facility-administered encounter medications on file as of 03/01/2021.    Patient Active Problem List   Diagnosis Date Noted   TIA (transient ischemic attack) 02/20/2021   Protein-calorie malnutrition, severe 02/09/2021   Abnormal  CT scan, gastrointestinal tract    BRBPR (bright red blood per rectum) 05/01/2020   Weakness 04/25/2020   Pancolitis ([facility]/27/2021   Type 2 diabetes mellitus ([facility]09/27/2021   Colitis 04/21/2020   AKI (acute kidney injury) ([facility]/23/2021   Major depression, chronic 06/01/2018   OCD (obsessive compulsive disorder) 06/01/2018   Chronic heart failure with preserved ejection fraction ([facility]10/18/2019   Left leg pain 11/04/2017   GERD (gastroesophageal reflux disease) 02/18/2017   Iron deficiency anemia 10/18/2016   Leg swelling 07/17/2016   Hypertension 03/21/2016   Vitamin D deficiency 02/21/2016   B12 deficiency 02/21/2016   Poorly controlled type 2 diabetes mellitus ([facility]02/14/2017   Migraines 05/23/2015   Restless legs syndrome (RLS) 05/23/2015   Recurrent UTI 04/26/2015   Atrophic vaginitis 04/26/2015   Dyspareunia, female 04/26/2015    Conditions to be addressed/monitored: HTN, DMII, Depression, and colitis and falls   Care Plan : LCSW: Depression (Adult)  Updates made by TaVanita Inglesince 03/01/2021 12:00 AM     Care Plan : RNCM: Diabetes Type 2 (Adult)  Updates made by TaVanita Inglesince 03/01/2021 12:00 AM     Problem: RNCM: Glycemic Management (Diabetes, Type 2)   Priority: Medium     Long-Range Goal: RNCM: Glycemic Management Optimized   Start Date: 03/01/2021  Expected End Date: 03/01/2022  This Visit's Progress: Not on track  Priority: Medium  Note:   Objective:  Lab Results  Component Value Date   HGBA1C 11.7 (H) 02/09/2021     Lab Results  Component Value Date   CREATININE 0.60 02/20/2021   CREATININE 0.51 02/09/2021   CREATININE 0.70 02/08/2021    No results found for: EGFR Current Barriers:  Knowledge Deficits related to basic Diabetes pathophysiology and self care/management Knowledge Deficits related to medications used for management of diabetes Limited Social Support Unable to independently manage DM Does not contact provider office  for questions/concerns Frequent exacerbations of colitis with steroid therapy making glucose levels elevated Case Manager Clinical Goal(s):  Collaboration with HoJon BillingsNP regarding development and update of comprehensive plan of care as evidenced by provider attestation and co-signature Inter-disciplinary care team collaboration (see longitudinal plan of care) patient will demonstrate improved adherence to prescribed treatment plan for diabetes self care/management as evidenced by:  daily monitoring and recording of CBG  adherence to ADA/ carb modified diet exercise 2/3 days/week adherence to prescribed medication regimen Interventions:  Provided education to patient about basic DM disease process Reviewed medications with patient and discussed importance of medication adherence. 12-02-2020: The patient is compliant with medications. The patient is currently experiencing and exacerbation in her colitis and is going to start a prednisone dose pack. Review of action plan provided by the pcp on how to take her insulin for a goal of keeping blood sugar <130.  The patient forgot that her blood sugars would elevate with the steroid therapy. Extensive education and support  given. Advised the patient to call the office for questions or concerns related to elevated blood sugars.  The patient verbalized understanding. 03-01-2021: The patient has not been taking her medications at night because during the night the patient has been having lows in the 30's to 50's and she is afraid to take the medications because of that. She is having highs during the day. Secure chat with the NP and the NP would like for the patient to come in for a visit or do a virtual visit. Advised the patient of the need for a follow up and the patient agreed.  Discussed plans with patient for ongoing care management follow up and provided patient with direct contact information for care management team Provided patient with written  educational materials related to hypo and hyperglycemia and importance of correct treatment. 12-02-2020: Education on monitoring for elevated blood sugars especially with the use of prednisone. The patient is aware of sx and sx and has action plan in place.03-01-2021: The patient is having highs during the day. The patients meter this am would not register a reading. After taking 10 units of insulin it still was in the 400's all day. The patient is on prednisone for a colitis flare up currently. The patient is very concerned because at night her blood sugars are 35, 40's, and 50's during the middle of the night. She has been eating a snack before going to bed. Education provided on making sure she takes her medications as prescribed, eating a bedtime snack and coming in to see the pcp for follow up. The patient verbalized understanding.  Reviewed scheduled/upcoming provider appointments including: 03-01-2021: The patient knows to expect a call from the pcp office for follow up on blood sugars Advised patient, providing education and rationale, to check cbg daily  and record, calling pcp for findings outside established parameters.  03-01-2021: The patient is having a new exacerbation of her colitis and is being followed by her GI provider is taking prednisone.  Review of blood sugar readings the patient is having very highs and during the middle of the night very lows.  Reminded the patient of blood sugar elevations with taking prednisone and to follow the action plan by the pcp. Review of what worse looks like and to seek emergent help if needed. Discussed with pcp about the patients concerns and she advised the patient come in for evaluation. Review of patient status, including review of consultants reports, relevant laboratory and other test results, and medications completed. Patient Goals/Self-Care Activities Over the next 120 days, patient will:  - Self administers oral medications as prescribed Self  administers insulin as prescribed Attends all scheduled provider appointments Checks blood sugars as prescribed and utilize hyper and hypoglycemia protocol as needed Adheres to prescribed ADA/carb modified - barriers to adherence to treatment plan identified-03-01-2021: Exacerbations of colitis with prednisone use - blood glucose monitoring encouraged - blood glucose readings reviewed- 03-01-2021: The patient is having high readings of 400's and is concerned, during the night levels are dropping to lows- 30-50's  - mutual A1C goal set or reviewed. - resources required to improve adherence to care identified - self-awareness of signs/symptoms of hypo or hyperglycemia encouraged - use of blood glucose monitoring log promoted Follow Up Plan: Telephone follow up appointment with care management team member scheduled for: 04-12-2021 at 1 pm    Task: RNCM: Alleviate Barriers to Glycemic Management Completed 03/01/2021  Outcome: Positive  Note:   Care Management Activities:    -  barriers to adherence to treatment plan identified - blood glucose monitoring encouraged - blood glucose readings reviewed - mutual A1C goal set or reviewed - resources required to improve adherence to care identified - self-awareness of signs/symptoms of hypo or hyperglycemia encouraged - use of blood glucose monitoring log promoted      Care Plan : RNCM: Hypertension (Adult)  Updates made by Vanita Ingles since 03/01/2021 12:00 AM     Problem: RNCM: Hypertension (Hypertension)   Priority: Medium     Long-Range Goal: RNCM: Hypertension Monitored   Start Date: 03/01/2021  Expected End Date: 03/01/2022  This Visit's Progress: On track  Priority: Medium  Note:   Objective:  Last practice recorded BP readings:  BP Readings from Last 3 Encounters:  02/20/21 110/72  02/10/21 121/74  01/06/21 120/85     Most recent eGFR/CrCl: No results found for: EGFR  No components found for: CRCL Current Barriers:  Knowledge  Deficits related to basic understanding of hypertension pathophysiology and self care management Knowledge Deficits related to understanding of medications prescribed for management of hypertension Limited Social Support Unable to self administer medications as prescribed Lacks social connections Does not contact provider office for questions/concerns Case Manager Clinical Goal(s):   patient will verbalize understanding of plan for hypertension management  patient will demonstrate improved adherence to prescribed treatment plan for hypertension as evidenced by taking all medications as prescribed, monitoring and recording blood pressure as directed, adhering to low sodium/DASH diet  patient will demonstrate improved health management independence as evidenced by checking blood pressure as directed and notifying PCP if SBP>160 or DBP > 90, taking all medications as prescribe, and adhering to a low sodium diet as discussed. Interventions:  Collaboration with [name], NP regarding development and update of comprehensive plan of care as evidenced by provider attestation and co-signature Inter-disciplinary care team collaboration (see longitudinal plan of care) Evaluation of current treatment plan related to hypertension self management and patient's adherence to plan as established by provider. 03-01-2021: The patient states she is doing well with managing her blood pressure. Is having swelling in her feet and legs likely due to her chronic heart failure, not worse than usual. Education on elevation of feet and legs when sitting, compliance with heart healthy/ADA diet, medications compliance, use of ted hose or compression socks, and calling provider for changes.  Provided education to patient re: stroke prevention, s/s of heart attack and stroke, DASH diet, complications of uncontrolled blood pressure Reviewed medications with patient and discussed importance of compliance.03-01-2021: The patient  states that she is compliant with cardiac medications  Discussed plans with patient for ongoing care management follow up and provided patient with direct contact information for care management team Advised patient, providing education and rationale, to monitor blood pressure daily and record, calling PCP for findings outside established parameters.  Patient Goals/Self-Care Activities Over the next 120 days, patient will:  - Self administers medications as prescribed Attends all scheduled provider appointments Calls provider office for new concerns, questions, or BP outside discussed parameters Checks BP and records as discussed Follows a low sodium diet/DASH diet - blood pressure trends reviewed - depression screen reviewed - home or ambulatory blood pressure monitoring encouraged Follow Up Plan: Telephone follow up appointment with care management team member scheduled for: 04-12-2021 at 1 pm    Task: RNCM: Identify and Monitor Blood Pressure Elevation Completed 03/01/2021  Outcome: Positive  Note:   Care Management Activities:    - blood pressure trends reviewed -  depression screen reviewed - home or ambulatory blood pressure monitoring encouraged        Care Plan : RNCM: Effective management of Colitis/Chron's  Updates made by Vanita Ingles since 03/01/2021 12:00 AM     Problem: RNCM: Health Promotion or Disease Self-Management (General Plan of Care) Colitis and Chron's management   Priority: High     Long-Range Goal: RNCM: Self-Management Plan Developed   Start Date: 03/01/2021  Expected End Date: 03/01/2022  This Visit's Progress: On track  Priority: High  Note:   Current Barriers:  Knowledge Deficits related to resources and education on effective management of colitis and chron's  Chronic Disease Management support and education needs related to colitis and chron's Lacks caregiver support.  Unable to independently manage exacerbations of colitis/chron's Lacks social  connections Does not contact provider office for questions/concerns  Nurse Case Manager Clinical Goal(s):  patient will verbalize understanding of plan for effective management of chron's and colitis  patient will work with [place], and pcp to address needs related to Management of chron's and colitis patient will demonstrate a decrease in colitis/chron's  exacerbations as evidenced by effective management of sx/sx, taking medications as prescribed, managing diet, and keeping appointments   patient will verbalize basic understanding of colitis/chron's  disease process and self health management plan as evidenced by no acute exacerbations and stabilization of disease process  Interventions:  1:1 collaboration with [name], NP regarding development and update of comprehensive plan of care as evidenced by provider attestation and co-signature Inter-disciplinary care team collaboration (see longitudinal plan of care) Evaluation of current treatment plan related to effective management of colitis and chron's disease  and patient's adherence to plan as established by provider. 03-01-2021: The patient is currently having an exacerbation of her colitis. She was in the hospital in July for colitis flare up. Is currently on a prednisone dose pack. Reminded the patient of the effects of prednisone on blood sugars and DM health. The patient advised to follow recommendations by pcp and specialist.  Advised patient to call the provider for changes in condition, worsening sx/sx or exacerbations. 03-01-2021: Reviewed  Provided education to patient re: dietary support with colitis and chron's. 03-01-2021: Education on staying hydrated and watching dietary restrictions of salt, fats, and sugars. Discussed bland foods and staying away from spicy foods and fried foods that can exacerbate her colitis  Discussed plans with patient for ongoing care management follow up and provided patient with direct contact information  for care management team  Patient Goals/Self-Care Activities Over the next 120 days, patient will:  - Patient will self administer medications as prescribed Patient will attend all scheduled provider appointments Patient will call pharmacy for medication refills Patient will call provider office for new concerns or questions Patient will work with BSW to address care coordination needs and will continue to work with the clinical team to address health care and disease management related needs.   - barriers to meeting goals identified- current flare up (12-02-2020)  - choices provided - collaboration with team encouraged - decision-making supported - health risks reviewed - problem-solving facilitated - questions answered - readiness for change evaluated - reassurance provided - resources needed to meet goals identified - self-reflection promoted - self-reliance encouraged - verbalization of feelings encouraged  Follow Up Plan: Telephone follow up appointment with care management team member scheduled for: 04-12-2021 at 1 pm        Task: RNCM: Mutually Develop and Royce Macadamia Achievement of Patient Goals Completed 03/01/2021  Outcome: Positive  Note:   Care Management Activities:    - barriers to meeting goals identified - choices provided - collaboration with team encouraged - decision-making supported - health risks reviewed - problem-solving facilitated - questions answered - readiness for change evaluated - reassurance provided - resources needed to meet goals identified - self-reflection promoted - self-reliance encouraged - verbalization of feelings encouraged    Notes: new diagnosis in September 2021 of chron's colitis    Care Plan : RNCM: Fall Risk (Adult)  Updates made by Vanita Ingles since 03/01/2021 12:00 AM     Problem: RNCM: Fall Risk   Priority: High  Onset Date: 03/01/2021     Long-Range Goal: RNCM: Absence of Fall and Fall-Related Injury   Start Date:  03/01/2021  Expected End Date: 03/01/2022  This Visit's Progress: Not on track  Priority: High  Note:   Current Barriers:  Knowledge Deficits related to fall precautions in patient with  Decreased adherence to prescribed treatment for fall prevention Unable to independently manage falls prevention due to onset of right foot dragging and "paralysis"  Unable to self administer medications as prescribed Does not adhere to prescribed medication regimen Does not contact provider office for questions/concerns Falls x 2 on 02-26-2021 and falls before that 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  Chronic Disease Management support and education needs related to multiple chronic conditions impacting her care Non-adherence to prescribed medication regimen Frequent falls and high fall risk due to right foot dragging and paraysis  Clinical Goal(s):  patient will demonstrate improved adherence to prescribed treatment plan for decreasing falls as evidenced by patient reporting and review of EMR patient will verbalize using fall risk reduction strategies discussed patient will not experience additional falls patient will verbalize understanding of plan for effective management of preventing falls and remaining safe in her environment  patient will work with [facility] and pcp  to address needs related to falls prevention and safety and new onset of right foot dragging and paralysis  patient will demonstrate a decrease in falls  exacerbations patient will take all medications exactly as prescribed and will call provider for medication related questions patient will attend all scheduled medical appointments: patient to see pcp for follow up, patient to call neurologist to get an appointment for new onset of paralysis and foot dragging  in her right foot patient will demonstrate improved health management independence patient will demonstrate understanding of rationale for  each prescribed medication the patient will demonstrate ongoing self health care management ability Interventions:  Collaboration with [name], NP regarding development and update of comprehensive plan of care as evidenced by provider attestation and co-signature Inter-disciplinary care team collaboration (see longitudinal plan of care) Provided written and verbal education re: Potential causes of falls and Fall prevention strategies Reviewed medications and discussed potential side effects of medications such as dizziness and frequent urination Assessed for s/s of orthostatic hypotension Assessed for falls since last encounter. 03-01-2021: Last reported fall x 2 was on 02-26-2021 Assessed patients knowledge of fall risk prevention secondary to previously provided education. Assessed working status of life alert bracelet and patient adherence Provided patient information for fall alert systems Evaluation of current treatment plan related to falls prevention and safety and patient's adherence to plan as established by provider. Advised patient to call the office for new falls, also to call the neurology office to obtain an appointment for recommendations and evaluation Provided education to patient re: safety  and using a cane or a walker to help support weak side, preventing fractures or head injuries due to high fall risk Collaborated with pcp regarding changes in condition and new onset of right foot dragging and paralysis  Discussed plans with patient for ongoing care management follow up and provided patient with direct contact information for care management team Self-Care Deficits:  Unable to independently manage falls as evidence of falls x 2 on 7-31-2022c Does not adhere to prescribed medication regimen Does not contact provider office for questions/concerns Patient Goals:  - Utilize cane or a walker  (assistive device) appropriately with all ambulation - De-clutter walkways -  Change positions slowly - Wear secure fitting shoes at all times with ambulation - Utilize home lighting for dim lit areas - Demonstrate self and pet awareness at all times Follow Up Plan: Telephone follow up appointment with care management team member scheduled for: 04-12-2021 at 1 pm    Task: Identify and Manage Contributors to Fall Risk Completed 03/01/2021  Outcome: Positive  Note:   Care Management Activities:    - activities of daily living skills assessed - assistive or adaptive device use encouraged - barriers to physical activity or exercise addressed - barriers to physical activity or exercise identified - barriers to safety identified - cognition assessed - cognitive-stimulating activities promoted - fall prevention plan reviewed and updated - fear of falling, loss of independence and pain acknowledged - medication list reviewed - modification of home and work environment promoted    Notes:      Plan: Telephone follow up appointment with care management team member scheduled for:  04-12-2021 at 1 pm  [place], MSN, [facility] Mobile: [phone]

## 2021-03-01 NOTE — Patient Instructions (Signed)
Visit Information   Goals Addressed             This Visit's Progress    RNCM: Manage emotions due to depression       Timeframe:  Long-Range Goal Priority:  High Start Date:     03-01-2021                        Expected End Date:    03-01-2022                   Follow Up Date 04/12/2021    - check out counseling -talk about feelings and what is causing changes in mood, anxiety, depression -work with the CCM team to meet mental health needs    Why is this important?   Beating depression may take some time.  If you don't feel better right away, don't give up on your treatment plan.    Notes: 03-01-2021: The patient is having a hard time with all the issues related to the changes in her health. She is really concerned about her safety and well being. She is having blood sugars drop really low at night and being very high in the am. The patient agrees to get an appointment to see the pcp for evaluation and recommendations.      RNCM: Prevent Falls and Injury       Follow Up Date 04/12/2021    - add more outdoor lighting - always use handrails on the stairs - always wear low-heeled or flat shoes or slippers with nonskid soles - call the doctor if I am feeling too drowsy - install bathroom grab bars - keep a flashlight by the bed - keep my cell phone with me always - learn how to get back up if I fall - make an emergency alert plan in case I fall - pick up clutter from the floors - use a nonslip pad with throw rugs, or remove them completely - use a cane or walker - use a nightlight in the bathroom -call neurologist for an appointment for follow up for new onset of foot paralysis and dragging of right foot.     Why is this important?   Most falls happen when it is hard for you to walk safely. Your balance may be off because of an illness. You may have pain in your knees, hip or other joints.  You may be overly tired or taking medicines that make you sleepy. You may not be able to  see or hear clearly.  Falls can lead to broken bones, bruises or other injuries.  There are things you can do to help prevent falling.     Notes: 03-01-2021: The patient is having issues with her right foot with "paralysis and dragging", this has caused her to fall several times. The patient states that she is concerned about her health and is wanting help with the management of symptoms. Agrees to work with the CCM team for effective management of health and well being and to prevent falls and injuries.         Patient verbalizes understanding of instructions provided today and agrees to view in [place].   Telephone follow up appointment with care management team member scheduled for: 04-12-2021 at 1 pm  Noreene Larsson RN, MSN, [facility] Mobile: [phone]

## 2021-03-01 NOTE — Telephone Encounter (Signed)
  Chronic Care Management   Note  03/01/2021 Name: SHINE SCROGHAM MRN: [id number] DOB: [birth year]  Returned the call the patient and completed the call. See the new encounter   Follow up plan: Telephone follow up appointment with care management team member scheduled for:04-12-2021  Noreene Larsson RN, MSN, [facility] Mobile: [phone]

## 2021-03-01 NOTE — Progress Notes (Signed)
This encounter was created in error - please disregard.

## 2021-03-02 ENCOUNTER — Ambulatory Visit (INDEPENDENT_AMBULATORY_CARE_PROVIDER_SITE_OTHER): Payer: BC Managed Care – PPO | Admitting: Physician Assistant

## 2021-03-02 ENCOUNTER — Telehealth (INDEPENDENT_AMBULATORY_CARE_PROVIDER_SITE_OTHER): Payer: Self-pay | Admitting: Nurse Practitioner

## 2021-03-02 ENCOUNTER — Encounter: Payer: Self-pay | Admitting: Nurse Practitioner

## 2021-03-02 ENCOUNTER — Encounter: Payer: Self-pay | Admitting: Oncology

## 2021-03-02 ENCOUNTER — Encounter: Payer: Self-pay | Admitting: Physician Assistant

## 2021-03-02 ENCOUNTER — Other Ambulatory Visit: Payer: Self-pay

## 2021-03-02 DIAGNOSIS — F319 Bipolar disorder, unspecified: Secondary | ICD-10-CM

## 2021-03-02 DIAGNOSIS — F411 Generalized anxiety disorder: Secondary | ICD-10-CM

## 2021-03-02 DIAGNOSIS — G4700 Insomnia, unspecified: Secondary | ICD-10-CM | POA: Diagnosis not present

## 2021-03-02 DIAGNOSIS — R69 Illness, unspecified: Secondary | ICD-10-CM

## 2021-03-02 DIAGNOSIS — E1165 Type 2 diabetes mellitus with hyperglycemia: Secondary | ICD-10-CM

## 2021-03-02 DIAGNOSIS — G2581 Restless legs syndrome: Secondary | ICD-10-CM

## 2021-03-02 MED ORDER — LAMOTRIGINE 200 MG PO TABS: 400.0000 mg | ORAL_TABLET | Freq: Every day | ORAL | 11 refills | Status: DC

## 2021-03-02 MED ORDER — RISPERIDONE 3 MG PO TABS: 3.0000 mg | ORAL_TABLET | Freq: Every day | ORAL | 5 refills | Status: DC

## 2021-03-02 MED ORDER — ALPRAZOLAM 1 MG PO TABS: 1.0000 mg | ORAL_TABLET | Freq: Three times a day (TID) | ORAL | 1 refills | Status: DC | PRN

## 2021-03-02 MED ORDER — SERTRALINE HCL 100 MG PO TABS: 200.0000 mg | ORAL_TABLET | Freq: Every day | ORAL | 11 refills | Status: DC

## 2021-03-02 NOTE — Progress Notes (Signed)
[facility]  Patient ID: Amy Hansen,  MRN: [id number]  PCP: [name], NP  Date of Evaluation: 03/02/2021 Time spent:40 minutes  Chief Complaint:  Chief Complaint   Anxiety; Depression; Stress; Follow-up      HISTORY/CURRENT STATUS: For 6 month f/u. It's been a rough 6 months.   Had an acute flair of UC and Crohns and was hospitalized in July. Didn't need transfusions this time like last fall.  Had a TIA when in hospitalized. Is now having dragging her right foot. Has appt with neuro next week. Has fallen 3 times since the TIA, she thinks it is b/c of the foot.  She does not feel dizzy or off balance in any way.  Had Ortho surgery left thumb after she accidentally fell with scissors in her hand and severed the tendon in left thumb.  Glucoses are out the roof b/c on steroids.    States all of this has taken a toll on her mental health.  She has had more anxiety and is taking more Xanax than she should.  Usually 5 pills/day instead of 4 which is what is prescribed.  She has a lot of generalized anxiety plus panic attacks.  Especially when thinking about these chronic illnesses and the unknown is unnerving.  She has trouble sleeping sometimes because she has obtrusive thoughts that will not go away to let her go to sleep.  Restless leg seems to be controlled for the most part.  She is able to work at [employer] but it is difficult.  Does not have a lot of energy or motivation.  She is helping out with her nephew who has autism and cannot be left alone.  In fact he is with her today.  Has a hard time enjoying things but it is mostly due to the physical problems.  She is not isolating.  Does cry easily at times.  Appetite is fair.  She has lost more weight with this recent episode of Crohn's and ulcerative colitis flare.  Denies suicidal thoughts.  Patient denies increased energy with decreased need for sleep, no increased talkativeness, no racing thoughts, no  impulsivity or risky behaviors, no increased spending, no increased libido, no grandiosity, no increased irritability or anger, and no hallucinations.  Review of Systems  Constitutional:  Positive for malaise/fatigue.  HENT: Negative.    Eyes: Negative.   Respiratory: Negative.    Cardiovascular: Negative.   Gastrointestinal:  Positive for abdominal pain and diarrhea.  Genitourinary: Negative.   Musculoskeletal:  Positive for falls.       See HPI  Skin: Negative.   Neurological:  Positive for weakness.  Endo/Heme/Allergies: Negative.   Psychiatric/Behavioral:  Positive for depression. The patient is nervous/anxious and has insomnia.    Individual Medical History/ Review of Systems: Changes? :Yes See above  Past medications for mental health diagnoses include: Trazodone, Risperdal, Zoloft, Lunesta, praises son, [employer], Prozac, Depakote, Lamictal, lithium, Wellbutrin, Xanax, Ambien, carbamazepine  Allergies: Avelox [moxifloxacin hcl in nacl], Bactrim [sulfamethoxazole-trimethoprim], Ciprofloxacin, Depakote [divalproex sodium], Imitrex [sumatriptan], and Stadol [butorphanol]  Current Medications:  Current Outpatient Medications:    ALPRAZolam (XANAX) 1 MG tablet, Take 1 tablet (1 mg total) by mouth 3 (three) times daily as needed for anxiety., Disp: 90 tablet, Rfl: 1   buPROPion (WELLBUTRIN XL) 150 MG 24 hr tablet, Take 3 tablets by mouth once daily, Disp: 270 tablet, Rfl: 3   furosemide (LASIX) 20 MG tablet, Take 1 tablet (65m) daily. Take an additional tablet (  30m) AS NEEDED., Disp: 30 tablet, Rfl: 3   glipiZIDE (GLUCOTROL) 5 MG tablet, Take 1 tablet by mouth twice daily., Disp: 180 tablet, Rfl: 1   insulin glargine, 2 Unit Dial, (TOUJEO MAX SOLOSTAR) 300 UNIT/ML Solostar Pen, Inject 24 units daily. Titrate as instructed. Max daily dose 40 units, Disp: 12 mL, Rfl: 1   Insulin Pen Needle (PEN NEEDLES) 32G X 4 MM MISC, 1 Units by Does not apply route at bedtime., Disp: 100 each, Rfl:  1   loperamide (IMODIUM) 2 MG capsule, Take 1 capsule (2 mg total) by mouth as needed for diarrhea or loose stools., Disp: 30 capsule, Rfl: 0   metFORMIN (GLUCOPHAGE) 1000 MG tablet, Take 1 tablet by mouth twice daily., Disp: 180 tablet, Rfl: 1   ondansetron (ZOFRAN) 4 MG tablet, Take 1 tablet (4 mg total) by mouth daily as needed for nausea or vomiting., Disp: 30 tablet, Rfl: 1   pramipexole (MIRAPEX) 1 MG tablet, Take 1 tablet (1 mg total) by mouth at bedtime., Disp: 90 tablet, Rfl: 0   predniSONE (DELTASONE) 10 MG tablet, Take 1 tablet (10 mg total) by mouth daily., Disp: 30 tablet, Rfl: 0   predniSONE (DELTASONE) 10 MG tablet, Take 1 tablet (10 mg total) by mouth daily., Disp: 30 tablet, Rfl: 0   Cholecalciferol (VITAMIN D) 50 MCG (2000 UT) CAPS, Take 1 capsule (2,000 Units total) by mouth daily., Disp: 30 capsule, Rfl:    lamoTRIgine (LAMICTAL) 200 MG tablet, Take 2 tablets (400 mg total) by mouth at bedtime., Disp: 60 tablet, Rfl: 11   oxyCODONE-acetaminophen (PERCOCET) 5-325 MG tablet, Take 1 tablet by mouth every 4 (four) hours as needed for severe pain. (Patient not taking: No sig reported), Disp: 20 tablet, Rfl: 0   risperiDONE (RISPERDAL) 3 MG tablet, Take 1 tablet (3 mg total) by mouth at bedtime., Disp: 30 tablet, Rfl: 5   sertraline (ZOLOFT) 100 MG tablet, Take 2 tablets (200 mg total) by mouth daily., Disp: 60 tablet, Rfl: 11 Medication Side Effects: none  Family Medical/ Social History: Changes?  No  MENTAL HEALTH EXAM:  There were no vitals taken for this visit.There is no height or weight on file to calculate BMI.  General Appearance: Casual, Neat, and Well Groomed  Eye Contact:  Good  Speech:  Clear and Coherent and Normal Rate  Volume:  Normal  Mood:  Euthymic  Affect:  Appropriate  Thought Process:  Goal Directed and Descriptions of Associations: Intact  Orientation:  Full (Time, Place, and Person)  Thought Content: Logical   Suicidal Thoughts:  No  Homicidal  Thoughts:  No  Memory:  WNL  Judgement:  Good  Insight:  Good  Psychomotor Activity:  Normal  Concentration:  Concentration: Good and Attention Span: Good  Recall:  Good  Fund of Knowledge: Good  Language: Good  Assets:  Desire for Improvement  ADL's:  Intact  Cognition: WNL  Prognosis:  Good   labs 02/20/2021 CBC platelets 491, otherwise essentially normal. Glucose 222, BUN and creatinine were normal, alk phos was 122, AST 76, ALT 49.  DIAGNOSES:    ICD-10-CM   1. Bipolar I disorder ([place]  F31.9     2. Chronic illness  R69     3. Generalized anxiety disorder  F41.1     4. Insomnia, unspecified type  G47.00     5. Restless legs syndrome (RLS)  G25.81        Receiving Psychotherapy: No    RECOMMENDATIONS:  PDMP reviewed.  Last Xanax filled 02/14/2021. I provided 40 minutes of face to face time during this encounter, including time spent before and after the visit in records review, medical decision making, and charting.  We discussed her serious chronic illnesses.  I think increasing the Xanax will be helpful for the anxiety which then causes more depression.  Also recommend increasing Zoloft which will help with the depression and anxiety. Increase Xanax to 1 mg, 1 po tid prn. Or ok to take 1/2 pill, 6 times per day. Not over 3 pills per day.  (This is a 0.5 to 1 mg increase daily.) Continue Wellbutrin XL 150 mg, 3 p.o. daily. Continue Lamictal 200 mg, 1 p.o. twice daily. Continue Mirapex 1 mg nightly (from another provider). Continue Risperdal 3 mg nightly.   Increase Zoloft to 100 mg, 2 p.o. daily. Recommend counseling. Return in 6 weeks.  Donnal Moat, PA-C

## 2021-03-02 NOTE — Assessment & Plan Note (Signed)
>>  ASSESSMENT AND PLAN FOR DIABETES MELLITUS TYPE 2 IN NONOBESE (HCC) WRITTEN ON 03/02/2021 11:33 AM BY HOLDSWORTH, KAREN, NP  Chronic. Not controlled. Discussed with patient the importance of being consistent with Toujeo and taking it even if her sugars are normal. Discussed with patient how toujeo works.  Reviewed with patient how to titrate insulin up by 2u every other day to have fasting blood sugars less than 150. Patient agrees with the plan.  Will follow up in 2 weeks to reevaluate blood sugars. Call sooner if concerns arise.

## 2021-03-02 NOTE — Progress Notes (Signed)
LMP  (LMP Unknown)    Subjective:    Patient ID: Amy Hansen, female    DOB: Nov 26, 1955, 65 y.o.   MRN: [id number]  HPI: Amy Hansen is a 65 y.o. female  Chief Complaint  Patient presents with   Diabetes    Patient states she was in office last week for a hospitalization follow up. Patient states she Case Manager wanted her to schedule an follow up visit due to elevated sugar levels of 600+. Patient state she took 15 units of insulin and it went down to 130 yesterday evening. Patient states she checked this morning and the reading was 184. Patient state that the elevated sugars are interfering with her visions, as she use to could see things far away and now she is having trouble. Patient Diabetic Eye Exam   DIABETES Patient has not been titrating insulin up as directed.  She is taking it only when her sugars are high and only doing 20 u.  Today patient's sugars were 310 after eating so she gave herself another 8u of toujeo. Hypoglycemic episodes:no Polydipsia/polyuria: no Visual disturbance: yes Chest pain: no Paresthesias: no Glucose Monitoring: yes  Accucheck frequency: Daily  Fasting glucose: 184  Post prandial:310  Evening:  Before meals: Taking Insulin?: yes  Long acting insulin:  Short acting insulin:   Relevant past medical, surgical, family and social history reviewed and updated as indicated. Interim medical history since our last visit reviewed. Allergies and medications reviewed and updated.  Review of Systems  Eyes:  Negative for visual disturbance.  Cardiovascular:  Negative for chest pain.  Endocrine: Negative for polydipsia and polyuria.  Neurological:  Negative for numbness.   Per HPI unless specifically indicated above     Objective:    LMP  (LMP Unknown)   Wt Readings from Last 3 Encounters:  02/20/21 119 lb (54 kg)  02/08/21 130 lb 1.1 oz (59 kg)  01/06/21 130 lb (59 kg)    Physical Exam Vitals and nursing note reviewed.  HENT:      Head: Normocephalic.     Right Ear: Hearing normal.     Left Ear: Hearing normal.     Nose: Nose normal.  Eyes:     Pupils: Pupils are equal, round, and reactive to light.  Pulmonary:     Effort: Pulmonary effort is normal. No respiratory distress.  Neurological:     Mental Status: She is alert.  Psychiatric:        Mood and Affect: Mood normal.        Behavior: Behavior normal.        Thought Content: Thought content normal.        Judgment: Judgment normal.    Results for orders placed or performed in visit on 02/20/21  Comp Met (CMET)  Result Value Ref Range   Glucose 222 (H) 65 - 99 mg/dL   BUN 5 (L) 8 - 27 mg/dL   Creatinine, Ser 0.60 0.57 - 1.00 mg/dL   eGFR 101 >59 mL/min/1.73   BUN/Creatinine Ratio 8 (L) 12 - 28   Sodium 135 134 - 144 mmol/L   Potassium 4.7 3.5 - 5.2 mmol/L   Chloride 96 96 - 106 mmol/L   CO2 26 20 - 29 mmol/L   Calcium 8.8 8.7 - 10.3 mg/dL   Total Protein 6.1 6.0 - 8.5 g/dL   Albumin 3.4 (L) 3.8 - 4.8 g/dL   Globulin, Total 2.7 1.5 - 4.5 g/dL   Albumin/Globulin Ratio 1.3  1.2 - 2.2   Bilirubin Total <0.2 0.0 - 1.2 mg/dL   Alkaline Phosphatase 122 (H) 44 - 121 IU/L   AST 76 (H) 0 - 40 IU/L   ALT 49 (H) 0 - 32 IU/L  CBC w/Diff  Result Value Ref Range   WBC 8.2 3.4 - 10.8 x10E3/uL   RBC 4.26 3.77 - 5.28 x10E6/uL   Hemoglobin 11.2 11.1 - 15.9 g/dL   Hematocrit 35.7 34.0 - 46.6 %   MCV 84 79 - 97 fL   MCH 26.3 (L) 26.6 - 33.0 pg   MCHC 31.4 (L) 31.5 - 35.7 g/dL   RDW 14.8 11.7 - 15.4 %   Platelets 491 (H) 150 - 450 x10E3/uL   Neutrophils 65 Not Estab. %   Lymphs 28 Not Estab. %   Monocytes 6 Not Estab. %   Eos 0 Not Estab. %   Basos 0 Not Estab. %   Neutrophils Absolute 5.3 1.4 - 7.0 x10E3/uL   Lymphocytes Absolute 2.3 0.7 - 3.1 x10E3/uL   Monocytes Absolute 0.5 0.1 - 0.9 x10E3/uL   EOS (ABSOLUTE) 0.0 0.0 - 0.4 x10E3/uL   Basophils Absolute 0.0 0.0 - 0.2 x10E3/uL   Immature Granulocytes 1 Not Estab. %   Immature Grans (Abs) 0.1 0.0 - 0.1  x10E3/uL      Assessment & Plan:   Problem List Items Addressed This Visit       Endocrine   Poorly controlled type 2 diabetes mellitus (HCC)    Chronic. Not controlled. Discussed with patient the importance of being consistent with Toujeo and taking it even if her sugars are normal. Discussed with patient how toujeo works.  Reviewed with patient how to titrate insulin up by 2u every other day to have fasting blood sugars less than 150. Patient agrees with the plan.  Will follow up in 2 weeks to reevaluate blood sugars. Call sooner if concerns arise.          Follow up plan: Return in about 2 weeks (around 03/16/2021) for DM check  (virtual).  This visit was completed via MyChart due to the restrictions of the COVID-19 pandemic. All issues as above were discussed and addressed. Physical exam was done as above through visual confirmation on MyChart. If it was felt that the patient should be evaluated in the office, they were directed there. The patient verbally consented to this visit. Location of the patient: Home Location of the provider: Office Those involved with this call:  Provider: [name], NP CMA: Irena Reichmann, [place] Desk/Registration: Roe Rutherford This encounter was conducted via Video.  I spent 20 dedicated to the care of this patient on the date of this encounter to include previsit review of 30, face to face time with the patient, and post visit ordering of testing.

## 2021-03-02 NOTE — Assessment & Plan Note (Signed)
Chronic. Not controlled. Discussed with patient the importance of being consistent with Toujeo and taking it even if her sugars are normal. Discussed with patient how toujeo works.  Reviewed with patient how to titrate insulin up by 2u every other day to have fasting blood sugars less than 150. Patient agrees with the plan.  Will follow up in 2 weeks to reevaluate blood sugars. Call sooner if concerns arise.

## 2021-03-07 ENCOUNTER — Other Ambulatory Visit: Payer: Self-pay | Admitting: Physician Assistant

## 2021-03-07 DIAGNOSIS — R200 Anesthesia of skin: Secondary | ICD-10-CM | POA: Diagnosis not present

## 2021-03-07 DIAGNOSIS — R262 Difficulty in walking, not elsewhere classified: Secondary | ICD-10-CM

## 2021-03-07 DIAGNOSIS — R531 Weakness: Secondary | ICD-10-CM | POA: Diagnosis not present

## 2021-03-07 DIAGNOSIS — R4781 Slurred speech: Secondary | ICD-10-CM

## 2021-03-07 DIAGNOSIS — R296 Repeated falls: Secondary | ICD-10-CM

## 2021-03-07 DIAGNOSIS — M21371 Foot drop, right foot: Secondary | ICD-10-CM

## 2021-03-15 ENCOUNTER — Encounter: Payer: Self-pay | Admitting: Oncology

## 2021-03-16 ENCOUNTER — Other Ambulatory Visit: Payer: Self-pay

## 2021-03-16 ENCOUNTER — Ambulatory Visit
Admission: RE | Admit: 2021-03-16 | Discharge: 2021-03-16 | Disposition: A | Discharge status: Home / Self Care | Payer: BC Managed Care – PPO | Source: Ambulatory Visit | Attending: Physician Assistant | Admitting: Physician Assistant

## 2021-03-16 DIAGNOSIS — R4781 Slurred speech: Secondary | ICD-10-CM | POA: Diagnosis not present

## 2021-03-16 DIAGNOSIS — R296 Repeated falls: Secondary | ICD-10-CM | POA: Diagnosis not present

## 2021-03-16 DIAGNOSIS — R200 Anesthesia of skin: Secondary | ICD-10-CM | POA: Diagnosis not present

## 2021-03-16 DIAGNOSIS — M21371 Foot drop, right foot: Secondary | ICD-10-CM | POA: Insufficient documentation

## 2021-03-16 DIAGNOSIS — R531 Weakness: Secondary | ICD-10-CM | POA: Diagnosis not present

## 2021-03-16 DIAGNOSIS — Z1231 Encounter for screening mammogram for malignant neoplasm of breast: Secondary | ICD-10-CM

## 2021-03-16 DIAGNOSIS — R202 Paresthesia of skin: Secondary | ICD-10-CM | POA: Diagnosis not present

## 2021-03-16 DIAGNOSIS — R262 Difficulty in walking, not elsewhere classified: Secondary | ICD-10-CM | POA: Diagnosis not present

## 2021-03-16 IMAGING — MR MR HEAD W/O CM
12 series · 45 of 48 positions shown · non-contrast
Comparison: Brain MRI [DATE].

CLINICAL DATA: Right foot drop [P9] ([P9]-CM). Weakness [P9]
([P9]-CM). Numbness and tingling R20.0, [P9] ([P9]-CM).
Difficulty walking [P9] ([P9]-CM). Slurred speech [P9]
([P9]-CM)F. Alls frequently [P9] ([P9]-CM). Additional history
provided by scanning technologist: Frequent falls, speech
difficulty, history of right sided footdrop.

EXAM:
MRI HEAD WITHOUT CONTRAST
TECHNIQUE: Multiplanar, multiecho pulse sequences of the brain and surrounding
structures were obtained without intravenous contrast.

[Series 5: ax dwi_tracew · axial · 3.0mm · 0.65mm/px · z∈[-101,+54]mm · 3 of 48 slices shown]
[im 1/48]
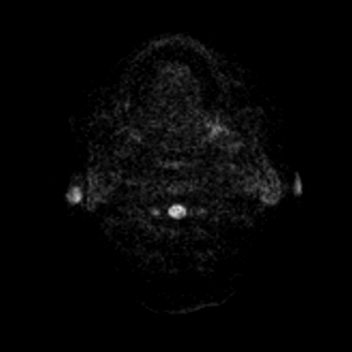
[im 24/48]
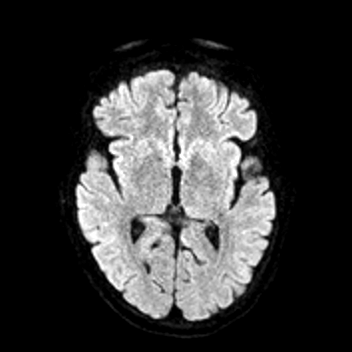
[im 48/48]
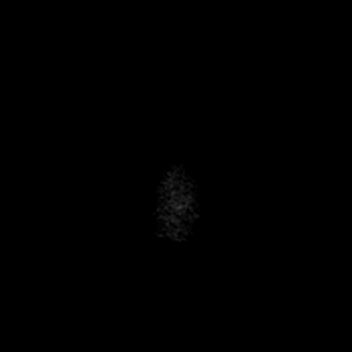

[Series 6: ax dwi_adc · axial · 3.0mm · 0.65mm/px · z∈[-101,+54]mm · 4 of 48 slices shown]
[im 1/48]
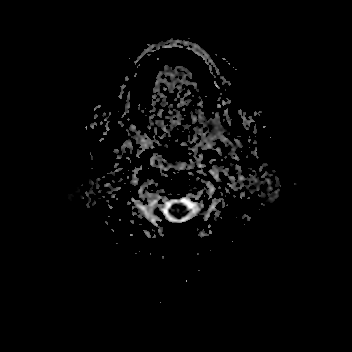
[im 16/48]
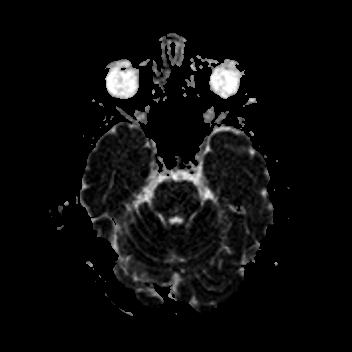
[im 32/48]
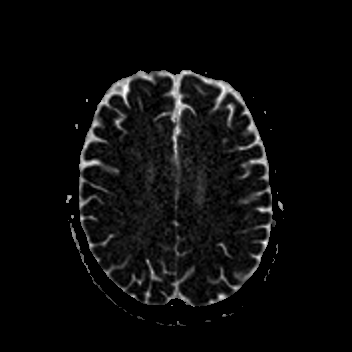
[im 48/48]
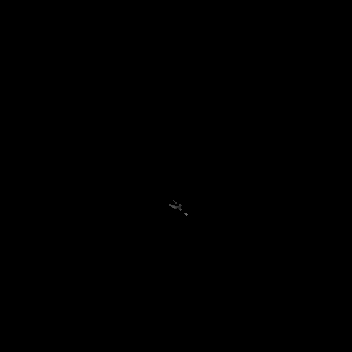

[Series 7: cor dwi_tracew · coronal · 5.0mm · 0.65mm/px · 3 of 38 slices shown]
[im 1/38]
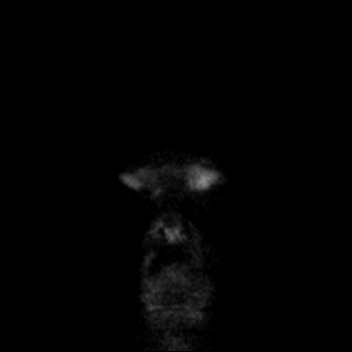
[im 19/38]
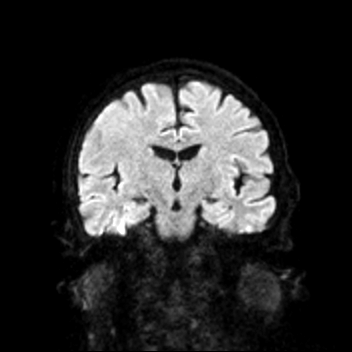
[im 38/38]
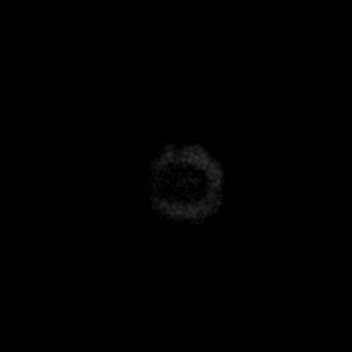

[Series 8: cor dwi_adc · coronal · 5.0mm · 0.65mm/px · 3 of 35 slices shown]
[im 1/35]
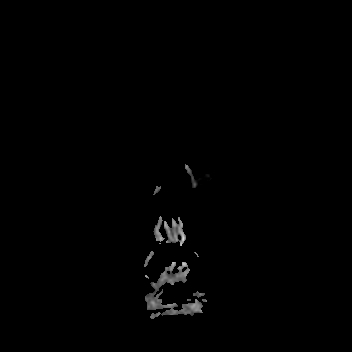
[im 18/35]
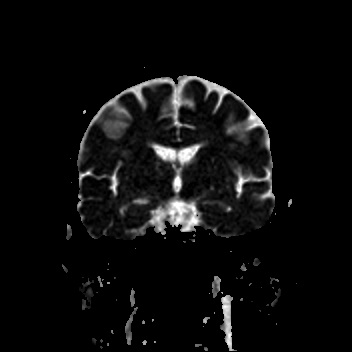
[im 35/35]
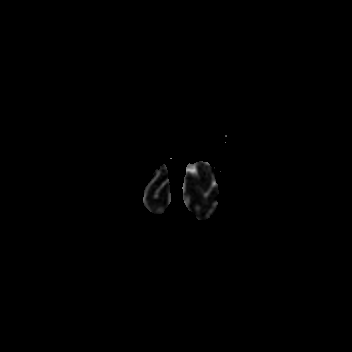

[Series 9: T1 · sagittal · 5.0mm · 0.62mm/px · 2 of 21 slices shown (1 of 2)]
[im 1/21]
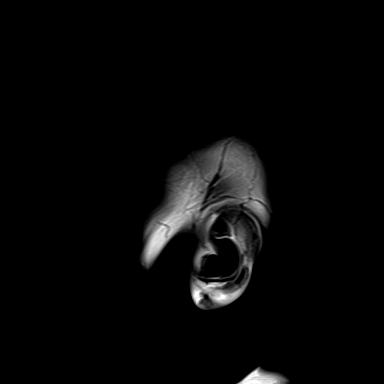
[im 21/21]
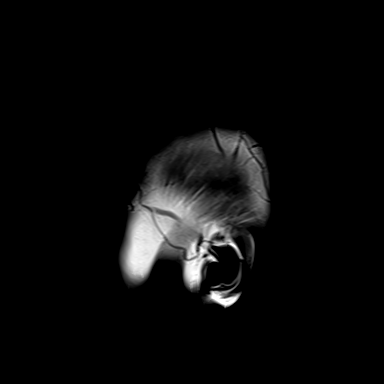

[Series 10: T2 · axial · 5.0mm · 0.53mm/px · z∈[-96,+48]mm · 2 of 25 slices shown (1 of 2)]
[im 1/25]
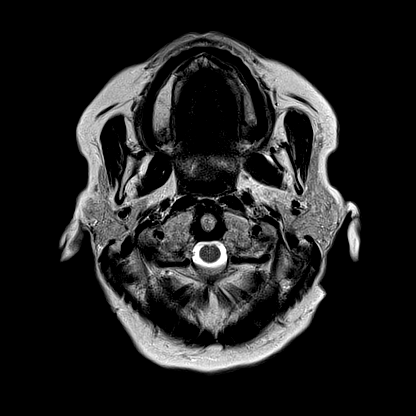
[im 25/25]
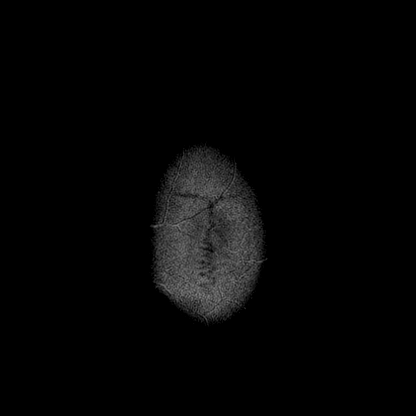

[Series 11: mag_images · axial · 3.0mm · 0.90mm/px · z∈[-112,+65]mm · 4 of 60 slices shown]
[im 1/60]
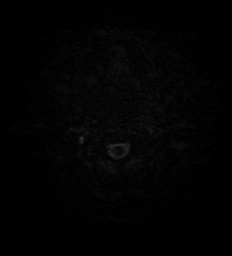
[im 20/60]
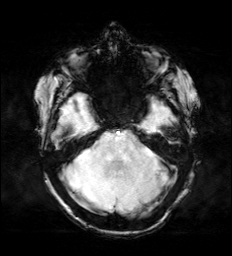
[im 40/60]
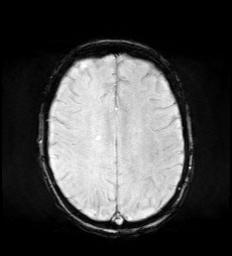
[im 60/60]
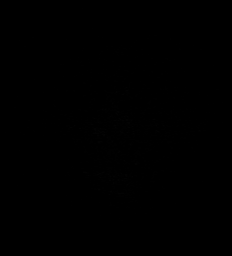

[Series 12: pha_images · axial · 3.0mm · 0.90mm/px · z∈[-109,+65]mm · 4 of 57 slices shown]
[im 1/57]
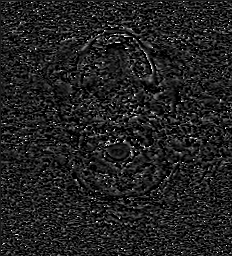
[im 19/57]
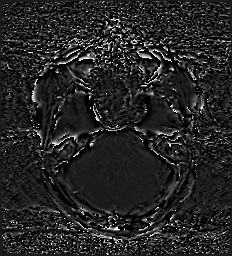
[im 38/57]
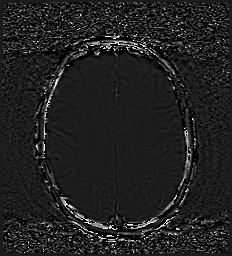
[im 57/57]
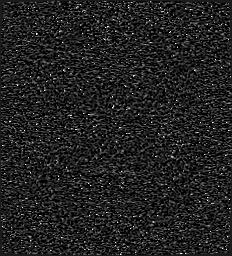

[Series 13: swi_images · axial · 3.0mm · 0.90mm/px · z∈[-112,+65]mm · 4 of 60 slices shown]
[im 1/60]
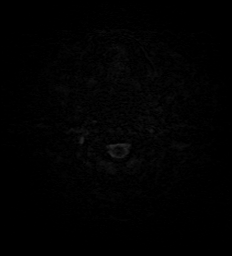
[im 20/60]
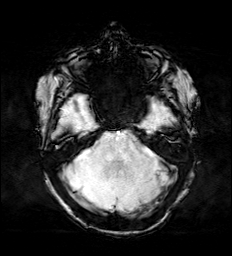
[im 40/60]
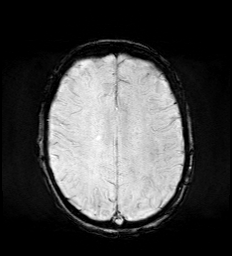
[im 60/60]
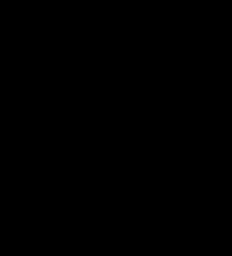

[Series 15: FLAIR · axial · 3.0mm · 0.53mm/px · z∈[-105,+57]mm · 4 of 55 slices shown]
[im 1/55]
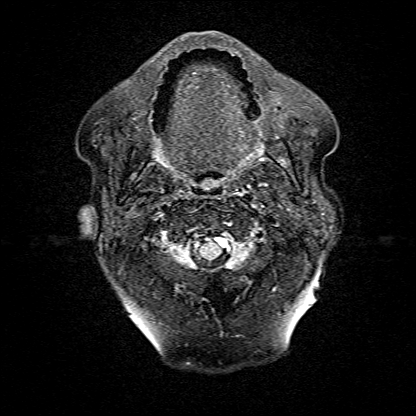
[im 19/55]
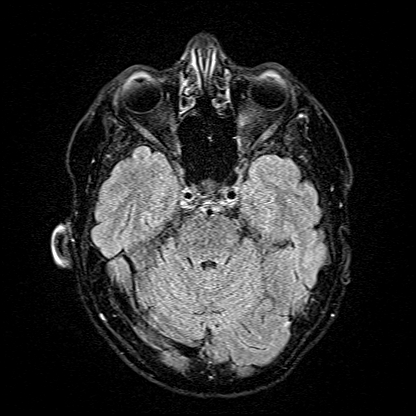
[im 37/55]
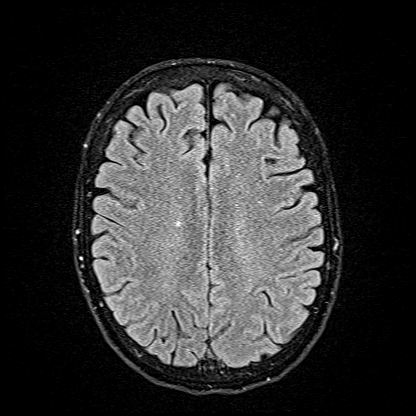
[im 55/55]
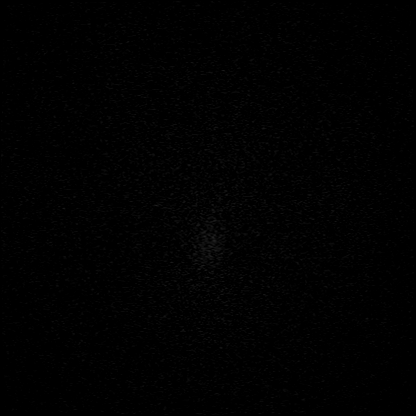

[Series 16: T1 · axial · 1.0mm · 0.98mm/px · z∈[-111,+64]mm · 10 of 176 slices shown (2 of 2)]
[im 1/176]
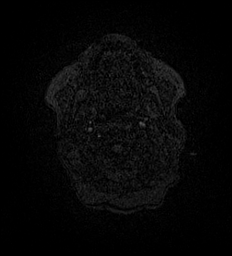
[im 15/176]
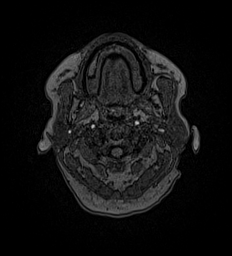
[im 30/176]
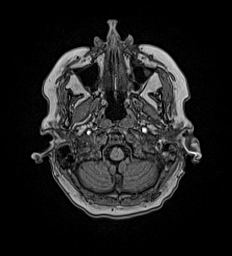
[im 44/176]
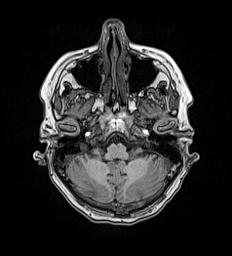
[im 59/176]
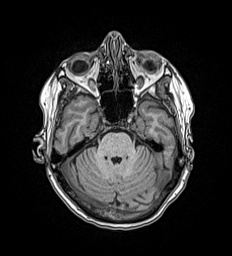
[im 73/176]
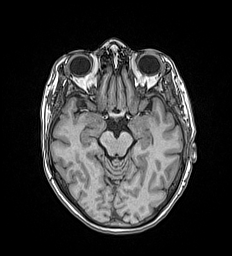
[im 103/176]
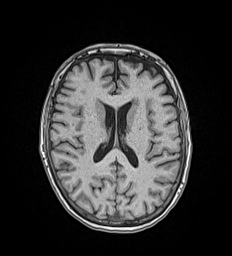
[im 117/176]
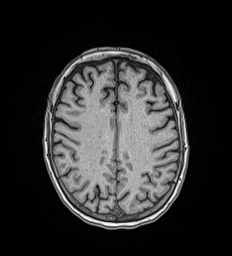
[im 146/176]
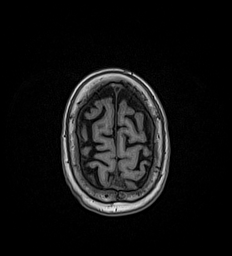
[im 176/176]
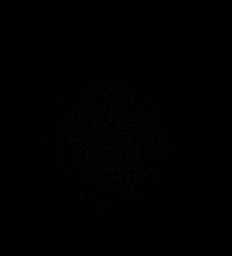

[Series 17: T2 · coronal · 5.0mm · 0.57mm/px · 2 of 29 slices shown (2 of 2)]
[im 1/29]
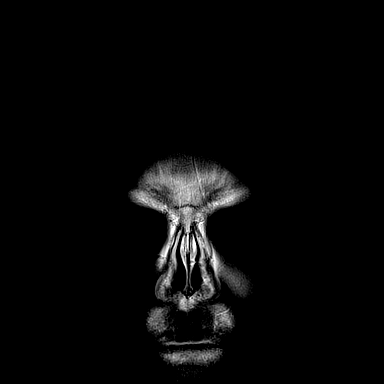
[im 29/29]
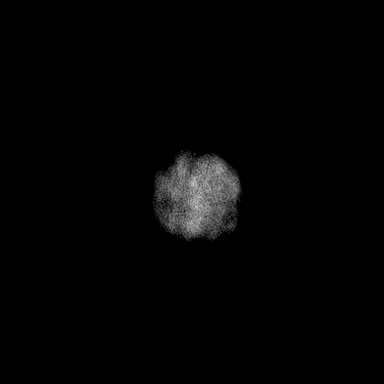

[45 of 48 positions shown; findings below may reference images not displayed]

FINDINGS: Brain:

Cerebral volume is normal for age.

Redemonstrated small chronic cortical/subcortical infarct within the
left parietal lobe (series 15, image 40).

As before, there are tiny chronic lacunar infarcts within the basal
ganglia bilaterally.

Stable mild-to-moderate multifocal T2/FLAIR hyperintense signal
abnormality within the cerebral white matter, nonspecific but most
often secondary to chronic small vessel ischemia.

There is no acute infarct.

No evidence of an intracranial mass.

No chronic intracranial blood products.

No extra-axial fluid collection.

No midline shift.

Vascular: Maintained flow voids within the proximal large arterial
vessels.

Skull and upper cervical spine: No focal suspicious marrow lesion.

Sinuses/Orbits: Visualized orbits show no acute finding. Trace
bilateral ethmoid and maxillary sinus mucosal thickening.

Other: Trace fluid within the right mastoid air cells.
IMPRESSION: No evidence of acute intracranial abnormality.

Stable non-contrast MRI appearance of the brain as compared
[DATE].

Small chronic cortical/subcortical infarct within the left parietal
lobe.

Mild-to-moderate multifocal T2/FLAIR hyperintense signal abnormality
within the cerebral white matter, nonspecific but most often
secondary to chronic small vessel ischemia.

Small chronic lacunar infarcts within the bilateral basal ganglia.

Minimal bilateral ethmoid and maxillary sinus mucosal thickening.

Trace fluid within the right mastoid air cells.

## 2021-03-17 ENCOUNTER — Other Ambulatory Visit: Payer: Self-pay | Admitting: Family

## 2021-03-20 DIAGNOSIS — M21371 Foot drop, right foot: Secondary | ICD-10-CM | POA: Diagnosis not present

## 2021-03-22 ENCOUNTER — Telehealth: Payer: Self-pay | Admitting: Internal Medicine

## 2021-03-22 ENCOUNTER — Other Ambulatory Visit: Payer: Self-pay

## 2021-03-22 MED ORDER — FUROSEMIDE 20 MG PO TABS: ORAL_TABLET | ORAL | 0 refills | Status: DC

## 2021-03-22 NOTE — Telephone Encounter (Signed)
*  STAT* If patient is at the pharmacy, call can be transferred to refill team.   1. Which medications need to be refilled? (please list name of each medication and dose if known) furosemide (LASIX) 20 MG tablet  2. Which pharmacy/location (including street and city if local pharmacy) is medication to be sent to?[facility] [place], [place]  3. Do they need a 30 day or 90 day supply? 30ds

## 2021-03-22 NOTE — Telephone Encounter (Signed)
furosemide (LASIX) 20 MG tablet 30 tablet 0 03/22/2021    Sig: Take 1 tablet (87m) daily. Take an additional tablet (217m AS NEEDED.   Sent to pharmacy as: furosemide (LASIX) 20 MG tablet   Notes to Pharmacy: Patient will need a follow up appt for further refills, Thanks ! [phone] E-Prescribing Status: Sent to pharmacy (03/22/2021 11:24 AM EDT)    [email or web address]

## 2021-03-28 ENCOUNTER — Ambulatory Visit: Payer: Self-pay | Admitting: Nurse Practitioner

## 2021-03-28 NOTE — Progress Notes (Deleted)
LMP  (LMP Unknown)    Subjective:    Patient ID: Amy Hansen, female    DOB: 1955-11-13, 65 y.o.   MRN: [id number]  HPI: Amy Hansen is a 65 y.o. female  No chief complaint on file.  DIABETES Hypoglycemic episodes:{Blank single:19197::"yes","no"} Polydipsia/polyuria: {Blank single:19197::"yes","no"} Visual disturbance: {Blank single:19197::"yes","no"} Chest pain: {Blank single:19197::"yes","no"} Paresthesias: {Blank single:19197::"yes","no"} Glucose Monitoring: {Blank single:19197::"yes","no"}  Accucheck frequency: {Blank single:19197::"Not Checking","Daily","BID","TID"}  Fasting glucose:  Post prandial:  Evening:  Before meals: Taking Insulin?: {Blank single:19197::"yes","no"}  Long acting insulin:  Short acting insulin: Blood Pressure Monitoring: {Blank single:19197::"not checking","rarely","daily","weekly","monthly","a few times a day","a few times a week","a few times a month"} Retinal Examination: {Blank single:19197::"Up to Date","Not up to Date"} Foot Exam: {Blank single:19197::"Up to Date","Not up to Date"} Diabetic Education: {Blank single:19197::"Completed","Not Completed"} Pneumovax: {Blank single:19197::"Up to Date","Not up to Date","unknown"} Influenza: {Blank single:19197::"Up to Date","Not up to Date","unknown"} Aspirin: {Blank single:19197::"yes","no"}  Relevant past medical, surgical, family and social history reviewed and updated as indicated. Interim medical history since our last visit reviewed. Allergies and medications reviewed and updated.  Review of Systems  Per HPI unless specifically indicated above     Objective:    LMP  (LMP Unknown)   Wt Readings from Last 3 Encounters:  02/20/21 119 lb (54 kg)  02/08/21 130 lb 1.1 oz (59 kg)  01/06/21 130 lb (59 kg)    Physical Exam  Results for orders placed or performed in visit on 02/20/21  Comp Met (CMET)  Result Value Ref Range   Glucose 222 (H) 65 - 99 mg/dL   BUN 5 (L) 8 - 27 mg/dL    Creatinine, Ser 0.60 0.57 - 1.00 mg/dL   eGFR 101 >59 mL/min/1.73   BUN/Creatinine Ratio 8 (L) 12 - 28   Sodium 135 134 - 144 mmol/L   Potassium 4.7 3.5 - 5.2 mmol/L   Chloride 96 96 - 106 mmol/L   CO2 26 20 - 29 mmol/L   Calcium 8.8 8.7 - 10.3 mg/dL   Total Protein 6.1 6.0 - 8.5 g/dL   Albumin 3.4 (L) 3.8 - 4.8 g/dL   Globulin, Total 2.7 1.5 - 4.5 g/dL   Albumin/Globulin Ratio 1.3 1.2 - 2.2   Bilirubin Total <0.2 0.0 - 1.2 mg/dL   Alkaline Phosphatase 122 (H) 44 - 121 IU/L   AST 76 (H) 0 - 40 IU/L   ALT 49 (H) 0 - 32 IU/L  CBC w/Diff  Result Value Ref Range   WBC 8.2 3.4 - 10.8 x10E3/uL   RBC 4.26 3.77 - 5.28 x10E6/uL   Hemoglobin 11.2 11.1 - 15.9 g/dL   Hematocrit 35.7 34.0 - 46.6 %   MCV 84 79 - 97 fL   MCH 26.3 (L) 26.6 - 33.0 pg   MCHC 31.4 (L) 31.5 - 35.7 g/dL   RDW 14.8 11.7 - 15.4 %   Platelets 491 (H) 150 - 450 x10E3/uL   Neutrophils 65 Not Estab. %   Lymphs 28 Not Estab. %   Monocytes 6 Not Estab. %   Eos 0 Not Estab. %   Basos 0 Not Estab. %   Neutrophils Absolute 5.3 1.4 - 7.0 x10E3/uL   Lymphocytes Absolute 2.3 0.7 - 3.1 x10E3/uL   Monocytes Absolute 0.5 0.1 - 0.9 x10E3/uL   EOS (ABSOLUTE) 0.0 0.0 - 0.4 x10E3/uL   Basophils Absolute 0.0 0.0 - 0.2 x10E3/uL   Immature Granulocytes 1 Not Estab. %   Immature Grans (Abs) 0.1 0.0 - 0.1 x10E3/uL  Assessment & Plan:   Problem List Items Addressed This Visit   None    Follow up plan: No follow-ups on file.

## 2021-03-30 ENCOUNTER — Encounter: Payer: Self-pay | Admitting: Physician Assistant

## 2021-03-30 ENCOUNTER — Ambulatory Visit (INDEPENDENT_AMBULATORY_CARE_PROVIDER_SITE_OTHER): Payer: BC Managed Care – PPO | Admitting: Physician Assistant

## 2021-03-30 ENCOUNTER — Telehealth: Payer: Self-pay

## 2021-03-30 ENCOUNTER — Other Ambulatory Visit (INDEPENDENT_AMBULATORY_CARE_PROVIDER_SITE_OTHER): Payer: BC Managed Care – PPO

## 2021-03-30 ENCOUNTER — Other Ambulatory Visit: Payer: Self-pay

## 2021-03-30 VITALS — BP 100/56 | HR 95 | Ht 61.0 in | Wt 136.0 lb

## 2021-03-30 DIAGNOSIS — K50119 Crohn's disease of large intestine with unspecified complications: Secondary | ICD-10-CM | POA: Diagnosis not present

## 2021-03-30 DIAGNOSIS — Z111 Encounter for screening for respiratory tuberculosis: Secondary | ICD-10-CM | POA: Diagnosis not present

## 2021-03-30 DIAGNOSIS — R197 Diarrhea, unspecified: Secondary | ICD-10-CM | POA: Diagnosis not present

## 2021-03-30 DIAGNOSIS — R7989 Other specified abnormal findings of blood chemistry: Secondary | ICD-10-CM | POA: Diagnosis not present

## 2021-03-30 LAB — BASIC METABOLIC PANEL
BUN: 10 mg/dL (ref 6–23)
CO2: 21 mEq/L (ref 19–32)
Calcium: 8.7 mg/dL (ref 8.4–10.5)
Chloride: 103 mEq/L (ref 96–112)
Creatinine, Ser: 0.72 mg/dL (ref 0.40–1.20)
GFR: 88.73 mL/min (ref >60.00)
Glucose, Bld: 235 mg/dL — ABNORMAL HIGH (ref 70–99)
Potassium: 3.9 mEq/L (ref 3.5–5.1)
Sodium: 136 mEq/L (ref 135–145)

## 2021-03-30 LAB — CBC WITH DIFFERENTIAL/PLATELET
Basophils Absolute: 0 10*3/uL (ref 0.0–0.1)
Basophils Relative: 0.2 % (ref 0.0–3.0)
Eosinophils Absolute: 0 10*3/uL (ref 0.0–0.7)
Eosinophils Relative: 0.1 % (ref 0.0–5.0)
HCT: 35.1 % — ABNORMAL LOW (ref 36.0–46.0)
Hemoglobin: 11.2 g/dL — ABNORMAL LOW (ref 12.0–15.0)
Lymphocytes Relative: 12.2 % (ref 12.0–46.0)
Lymphs Abs: 1.2 10*3/uL (ref 0.7–4.0)
MCHC: 31.9 g/dL (ref 30.0–36.0)
MCV: 86.4 fl (ref 78.0–100.0)
Monocytes Absolute: 0.5 10*3/uL (ref 0.1–1.0)
Monocytes Relative: 4.8 % (ref 3.0–12.0)
Neutro Abs: 7.9 10*3/uL — ABNORMAL HIGH (ref 1.4–7.7)
Neutrophils Relative %: 82.7 % — ABNORMAL HIGH (ref 43.0–77.0)
Platelets: 410 10*3/uL — ABNORMAL HIGH (ref 150.0–400.0)
RBC: 4.06 Mil/uL (ref 3.87–5.11)
RDW: 16.6 % — ABNORMAL HIGH (ref 11.5–15.5)
WBC: 9.5 10*3/uL (ref 4.0–10.5)

## 2021-03-30 LAB — C-REACTIVE PROTEIN: CRP: 7.4 mg/dL (ref 0.5–20.0)

## 2021-03-30 LAB — SEDIMENTATION RATE: Sed Rate: 47 mm/hr — ABNORMAL HIGH (ref 0–30)

## 2021-03-30 MED ORDER — FLUCONAZOLE 150 MG PO TABS: ORAL_TABLET | ORAL | 1 refills | Status: DC

## 2021-03-30 MED ORDER — FUROSEMIDE 20 MG PO TABS: ORAL_TABLET | ORAL | 0 refills | Status: DC

## 2021-03-30 MED ORDER — DICYCLOMINE HCL 10 MG PO CAPS: 10.0000 mg | ORAL_CAPSULE | Freq: Three times a day (TID) | ORAL | 4 refills | Status: DC | PRN

## 2021-03-30 MED ORDER — PREDNISONE 20 MG PO TABS: 20.0000 mg | ORAL_TABLET | Freq: Every day | ORAL | 2 refills | Status: DC

## 2021-03-30 NOTE — Telephone Encounter (Signed)
Patient was wondering if we could send in some Diflucan since the prednisone has been shooting her blood sugars and causing her to have a yeast infection?

## 2021-03-30 NOTE — Progress Notes (Signed)
Subjective:    Patient ID: Amy Hansen, female    DOB: 07/24/1956, 65 y.o.   MRN: [id number]  HPI Amy Hansen is a 65 year old white female, established with Dr. Havery Moros.  She comes in today for post hospital follow-up after recent hospitalization with acute febrile diarrheal illness.  Patient has diagnosis of Crohn's colitis. She was hospitalized 7/13 through 02/10/2021 with acute exacerbation of diarrhea.  There was suspicion for C. difficile and she had been started on empiric vancomycin just prior to admission as well as prednisone.  GI path panel was negative and C. difficile quick screen also negative.  She did complete a course of vancomycin, and was discharged on prednisone 40 mg/day which has gradually been tapered.  She says she has been on 20 mg/day over the past couple of weeks. She had previously been on Lialda 4/day but says she stopped that with this recent hospitalization as she does not feel that it was helping her at all and in someway she actually felt worse on the Lialda. Most recent colonoscopy had been done in March 2022 which showed a normal ileum, pandiverticular disease and patchy pancolitis worse in the left colon.  Biopsy showed chronic active colitis throughout. At that time discussion was started regarding potential need for biologic therapy however she did not have any insurance at that time.  She has since obtained insurance which has activated. Other medical problems include congestive heart failure, prior TIA, adult onset diabetes mellitus, restless leg syndrome, chronic depression/OCD and she is status post remote gastric bypass in 2010.  Since discharge from the hospital she says she has been having good days and bad days.  On good days she will have 3-4 semiformed bowel movements per day and on bad days usually at least 8 or 9 bowel movements per day associated with abdominal cramping and pain.  She is not been seeing any blood recently.  She is lactose intolerant and  has been avoiding lactose. She definitely feels that prednisone helps her and due to ongoing symptoms does not want her dose tapered.  She is in agreement with proceeding with biologic therapy.  Review of Systems Pertinent positive and negative review of systems were noted in the above HPI section.  All other review of systems was otherwise negative.   Outpatient Encounter Medications as of 03/30/2021  Medication Sig   ALPRAZolam (XANAX) 1 MG tablet Take 1 tablet (1 mg total) by mouth 3 (three) times daily as needed for anxiety.   buPROPion (WELLBUTRIN XL) 150 MG 24 hr tablet Take 3 tablets by mouth once daily   Cholecalciferol (VITAMIN D) 50 MCG (2000 UT) CAPS Take 1 capsule (2,000 Units total) by mouth daily.   dicyclomine (BENTYL) 10 MG capsule Take 1 capsule (10 mg total) by mouth 3 (three) times daily as needed for spasms.   gabapentin (NEURONTIN) 100 MG capsule Take 1 mg by mouth as needed.   glipiZIDE (GLUCOTROL) 5 MG tablet Take 1 tablet by mouth twice daily.   insulin glargine, 2 Unit Dial, (TOUJEO MAX SOLOSTAR) 300 UNIT/ML Solostar Pen Inject 24 units daily. Titrate as instructed. Max daily dose 40 units   Insulin Pen Needle (PEN NEEDLES) 32G X 4 MM MISC 1 Units by Does not apply route at bedtime.   lamoTRIgine (LAMICTAL) 200 MG tablet Take 2 tablets (400 mg total) by mouth at bedtime.   metFORMIN (GLUCOPHAGE) 1000 MG tablet Take 1 tablet by mouth twice daily.   ondansetron (ZOFRAN) 4 MG tablet Take  1 tablet (4 mg total) by mouth daily as needed for nausea or vomiting.   pramipexole (MIRAPEX) 1 MG tablet Take 1 tablet (1 mg total) by mouth at bedtime.   risperiDONE (RISPERDAL) 3 MG tablet Take 1 tablet (3 mg total) by mouth at bedtime.   sertraline (ZOLOFT) 100 MG tablet Take 2 tablets (200 mg total) by mouth daily.   [DISCONTINUED] furosemide (LASIX) 20 MG tablet Take 1 tablet (71m) daily. Take an additional tablet (272m AS NEEDED.   [DISCONTINUED] predniSONE (DELTASONE) 10 MG tablet  Take 1 tablet (10 mg total) by mouth daily.   [DISCONTINUED] predniSONE (DELTASONE) 10 MG tablet Take 1 tablet (10 mg total) by mouth daily.   predniSONE (DELTASONE) 20 MG tablet Take 1 tablet (20 mg total) by mouth daily with breakfast.   [DISCONTINUED] loperamide (IMODIUM) 2 MG capsule Take 1 capsule (2 mg total) by mouth as needed for diarrhea or loose stools.   [DISCONTINUED] oxyCODONE-acetaminophen (PERCOCET) 5-325 MG tablet Take 1 tablet by mouth every 4 (four) hours as needed for severe pain. (Patient not taking: No sig reported)   No facility-administered encounter medications on file as of 03/30/2021.   Allergies  Allergen Reactions   Avelox [Moxifloxacin Hcl In Nacl] Anaphylaxis   Bactrim [Sulfamethoxazole-Trimethoprim] Anaphylaxis   Ciprofloxacin Other (See Comments)    Pt states she was told never to take this as it is in the same family as Avelox.    Depakote [Divalproex Sodium] Other (See Comments)    Unknown Reaction   Imitrex [Sumatriptan] Other (See Comments)    Neck and shoulder pain   Stadol [Butorphanol] Rash   Patient Active Problem List   Diagnosis Date Noted   TIA (transient ischemic attack) 02/20/2021   Protein-calorie malnutrition, severe 02/09/2021   Abnormal CT scan, gastrointestinal tract    BRBPR (bright red blood per rectum) 05/01/2020   Weakness 04/25/2020   Pancolitis ([facility]/27/2021   Type 2 diabetes mellitus ([facility]/27/2021   Colitis 04/21/2020   AKI (acute kidney injury) ([facility]/23/2021   Major depression, chronic 06/01/2018   OCD (obsessive compulsive disorder) 06/01/2018   Chronic heart failure with preserved ejection fraction ([facility]/18/2019   Left leg pain 11/04/2017   GERD (gastroesophageal reflux disease) 02/18/2017   Iron deficiency anemia 10/18/2016   Leg swelling 07/17/2016   Hypertension 03/21/2016   Vitamin D deficiency 02/21/2016   B12 deficiency 02/21/2016   Poorly controlled type 2 diabetes mellitus ([facility]02/14/2017    Migraines 05/23/2015   Restless legs syndrome (RLS) 05/23/2015   Recurrent UTI 04/26/2015   Atrophic vaginitis 04/26/2015   Dyspareunia, female 04/26/2015   Social History   Socioeconomic History   Marital status: Married    Spouse name: Not on file   Number of children: Not on file   Years of education: Not on file   Highest education level: Not on file  Occupational History   Not on file  Tobacco Use   Smoking status: Former    Packs/day: 0.50    Types: Cigarettes    Quit date: 04/25/1975    Years since quitting: 45.9   Smokeless tobacco: Never   Tobacco comments:    quit 40 years  Vaping Use   Vaping Use: Never used  Substance and Sexual Activity   Alcohol use: No    Alcohol/week: 0.0 standard drinks   Drug use: No   Sexual activity: Not Currently    Birth control/protection: Post-menopausal  Other Topics Concern   Not on file  Social History Narrative   Caffeine 5 servings per day.   Social Determinants of Health   Financial Resource Strain: Not on file  Food Insecurity: Not on file  Transportation Needs: Not on file  Physical Activity: Not on file  Stress: Not on file  Social Connections: Not on file  Intimate Partner Violence: Not on file    Amy Hansen's family history includes Colon cancer in her mother; Heart failure in her sister; Stroke in her father.      Objective:    Vitals:   03/30/21 1114  BP: (!) 100/56  Pulse: 95    Physical Exam Well-developed well-nourished older white female in no acute distress.  Height, Weight, 136 BMI 25.7  HEENT; nontraumatic normocephalic, EOMI, PE R LA, sclera anicteric. Oropharynx; not examined today Neck; supple, no JVD Cardiovascular; regular rate and rhythm with S1-S2, no murmur rub or gallop Pulmonary; Clear bilaterally Abdomen; soft, he is tender in the left mid and left lower quadrant no guarding nondistended, no palpable mass or hepatosplenomegaly, bowel sounds are active Rectal; not done  today Skin; benign exam, no jaundice rash or appreciable lesions Extremities; no clubbing cyanosis or edema skin warm and dry Neuro/Psych; alert and oriented x4, grossly nonfocal mood and affect appropriate        Assessment & Plan:   # 53 65 year old white female with Crohn's colitis here for follow-up after brief hospitalization July 2022 with an acute febrile diarrheal illness. She had empirically been started on vancomycin and completed a course.  After she had been on the vancomycin for a few days GI path panel was negative as well as C. difficile quick screen.  Discharged on prednisone as well which is now at 20 mg/day.  She remains symptomatic with anywhere from 3-4 bowel movements per day up to 9 bowel movements per day.  She continues to have some abdominal cramping and discomfort.  Previously had been on Lialda which she self discontinued as she said she actually felt worse on it.  #2 status post prior gastric bypass 2010 3.  Adult onset diabetes mellitus 4.  Chronic depression/OCD 5.  Restless leg syndrome 6.  Prior TIA 7.  Congestive heart failure/preserved EF  Plan; continue prednisone at 20 mg/day.  Have sent new prescription for 20 mg tablets.  She is aware she may need to stay on prednisone over the next 6 to 8 weeks until we can get biologic initiated. Start dicyclomine 10 mg p.o. 3 times daily as needed for abdominal pain and cramping CBC with differential, sed rate, CRP today QuantiFERON gold and hepatitis B/C serology She has been vaccinated and Boosted /COVID-19 and says she is up-to-date with other immunizations Will initiate prior authorization for biologic therapy, per Dr. Doyne Keel prior notes preference would be for [facility]. Plan office follow-up with Dr. Havery Moros in 4 to 6 weeks.  Patient was advised to call should she have any worsening of symptoms in the interim while we are waiting for initiation of biologic therapy.  Amy Hansen Genia Harold  PA-C 03/30/2021   Cc: [name], NP

## 2021-03-30 NOTE — Addendum Note (Signed)
Addended by: Octavio Manns E on: 03/30/2021 12:05 PM   Modules accepted: Orders

## 2021-03-30 NOTE — Telephone Encounter (Signed)
Diflucan has been sent to the patient's pharmacy.

## 2021-03-30 NOTE — Patient Instructions (Addendum)
If you are age 64 or younger, your body mass index should be between 19-25. Your Body mass index is 25.7 kg/m. If this is out of the aformentioned range listed, please consider follow up with your Primary Care Provider.  __________________________________________________________  The [facility] providers would like to encourage you to use [facility] to communicate with providers for non-urgent requests or questions.  Due to long hold times on the telephone, sending your provider a message by [facility] may be a faster and more efficient way to get a response.  Please allow 48 business hours for a response.  Please remember that this is for non-urgent requests.   Your provider has requested that you go to the basement level for lab work before leaving today. Press "B" on the elevator. The lab is located at the first door on the left as you exit the elevator.  START Dicyclomine 20 mg 1 tablet three times daily as needed for abdominal pain/cramping  INCREASE Prednisone 20 mg 1 tablet with breakfast   Push fluids Avoid Lactose  You have been scheduled to follow up with Dr. Havery Moros on May 19, 2021 at 10:30 am  Thank you for entrusting me with your care and choosing [facility].  Amy Esterwood, PA-C

## 2021-03-30 NOTE — Telephone Encounter (Signed)
Copied from [place] [id number]. Topic: General - Inquiry >> Mar 30, 2021 10:07 AM Robina Ade, Helene Kelp D wrote: Reason for CRM: Patient called and said that she has a yeast infection and wants to know if her provider can call her antibiotic for it to [place].   Patient needs an appointment before medication can be sent in. Please call to schedule.

## 2021-03-31 ENCOUNTER — Telehealth: Payer: Self-pay

## 2021-03-31 ENCOUNTER — Other Ambulatory Visit: Payer: Self-pay | Admitting: Gastroenterology

## 2021-03-31 DIAGNOSIS — K50119 Crohn's disease of large intestine with unspecified complications: Secondary | ICD-10-CM

## 2021-03-31 LAB — HEPATITIS C ANTIBODY
Hepatitis C Ab: NONREACTIVE
SIGNAL TO CUT-OFF: 0.01 (ref <1.00)

## 2021-03-31 LAB — HEPATITIS B SURFACE ANTIGEN: Hepatitis B Surface Ag: NONREACTIVE

## 2021-03-31 NOTE — Telephone Encounter (Signed)
Pt was called and she stated that she will call the office that prescribed her the steriods to see if they can assist with the discomfort of her yeast infection.  Pt would call back if she needs [name] but at this present time she no longer needs anything further from our office.

## 2021-03-31 NOTE — Progress Notes (Signed)
Agree with assessment and plan as outlined. Yes my preference would be to treat with [facility] and get her started as soon as possible. Can you refer her to  infusion center, they can take care of prior auth, etc. Thanks

## 2021-03-31 NOTE — Telephone Encounter (Signed)
Secure staff message sent to Wynetta Fines to see which biologic is covered by patient's insurance.

## 2021-03-31 NOTE — Telephone Encounter (Signed)
[place], Amy S, PA-C  [place], Carlota Raspberry, MD; Yevette Edwards, RN I saw this patient in clinic today in hospital follow-up.  Ready to initiate biologic therapy-Crohn'Hansen colitis   Dr. Havery Moros, per your prior notes [name] would be preference   [facility], please initiate prior authorization process through her insurance company for biologic therapy, and then decision can be made based on what will be covered  Please let me/Dr. Havery Moros know so orders can be entered once we have authorization.

## 2021-04-04 ENCOUNTER — Telehealth: Payer: Self-pay | Admitting: Pharmacy Technician

## 2021-04-04 LAB — QUANTIFERON-TB GOLD PLUS
Mitogen-NIL: >10 IU/mL
NIL: 0.04 IU/mL
QuantiFERON-TB Gold Plus: NEGATIVE
TB1-NIL: 0 IU/mL
TB2-NIL: 0 IU/mL

## 2021-04-04 NOTE — Telephone Encounter (Signed)
Auth Submission: PENDING Payer: BCBS Medication & CPT/J Code(s) submitted: Entyvio (Vedolizumab) [id number] Route of submission (phone, fax, portal): COVER MY MEDS (MEDICAL BENEFIT) Auth type: Buy/Bill Units/visits requested: 9 Reference number: [id number]   Will update once we receive a response.

## 2021-04-05 NOTE — Telephone Encounter (Signed)
Entyvio approved, see alternate telephone encounter.

## 2021-04-05 NOTE — Telephone Encounter (Signed)
ENTYVIO CONNECT $5 CO-PAY ASSISTANCE: APPROVED ID# [id number] BIN: [id number] [id number] GR: [id number]

## 2021-04-05 NOTE — Telephone Encounter (Signed)
Wonderful thanks for your help

## 2021-04-05 NOTE — Telephone Encounter (Addendum)
Dr. Havery Moros,  Auth Submission: APPROVED - ENTYVIO Payer: BCBS Medication & CPT/J Code(s) submitted: Entyvio (Vedolizumab) [phone] Route of submission (phone, fax, portal): COVER MY MEDS Auth type: Buy/Bill Units/visits requested: 8 Reference number: [id number] APPROVAL DATE: 04/04/21 - 04/03/22

## 2021-04-07 DIAGNOSIS — N390 Urinary tract infection, site not specified: Secondary | ICD-10-CM | POA: Diagnosis not present

## 2021-04-07 DIAGNOSIS — R300 Dysuria: Secondary | ICD-10-CM | POA: Diagnosis not present

## 2021-04-11 DIAGNOSIS — E113393 Type 2 diabetes mellitus with moderate nonproliferative diabetic retinopathy without macular edema, bilateral: Secondary | ICD-10-CM | POA: Diagnosis not present

## 2021-04-12 ENCOUNTER — Ambulatory Visit: Payer: Self-pay

## 2021-04-12 ENCOUNTER — Telehealth: Payer: Self-pay | Admitting: General Practice

## 2021-04-12 DIAGNOSIS — R296 Repeated falls: Secondary | ICD-10-CM

## 2021-04-12 DIAGNOSIS — I10 Essential (primary) hypertension: Secondary | ICD-10-CM

## 2021-04-12 DIAGNOSIS — F329 Major depressive disorder, single episode, unspecified: Secondary | ICD-10-CM

## 2021-04-12 DIAGNOSIS — K529 Noninfective gastroenteritis and colitis, unspecified: Secondary | ICD-10-CM

## 2021-04-12 DIAGNOSIS — E1165 Type 2 diabetes mellitus with hyperglycemia: Secondary | ICD-10-CM

## 2021-04-12 NOTE — Patient Instructions (Signed)
Visit Information   Goals Addressed             This Visit's Progress    RNCM: Manage emotions due to depression       Timeframe:  Long-Range Goal Priority:  High Start Date:     03-01-2021                        Expected End Date:    03-01-2022                   Follow Up Date 06/14/2021    - check out counseling -talk about feelings and what is causing changes in mood, anxiety, depression -work with the CCM team to meet mental health needs    Why is this important?   Beating depression may take some time.  If you don't feel better right away, don't give up on your treatment plan.    Notes: 03-01-2021: The patient is having a hard time with all the issues related to the changes in her health. She is really concerned about her safety and well being. She is having blood sugars drop really low at night and being very high in the am. The patient agrees to get an appointment to see the pcp for evaluation and recommendations. 04-12-2021: The patient has been on antibiotics and still on prednisone. Blood sugars have been very erratic. The patient follows up with the pcp on 04-19-2021. Encouraged her to keep appointment with pcp.     RNCM: Prevent Falls and Injury       Follow Up Date 06/14/2021    - add more outdoor lighting - always use handrails on the stairs - always wear low-heeled or flat shoes or slippers with nonskid soles - call the doctor if I am feeling too drowsy - install bathroom grab bars - keep a flashlight by the bed - keep my cell phone with me always - learn how to get back up if I fall - make an emergency alert plan in case I fall - pick up clutter from the floors - use a nonslip pad with throw rugs, or remove them completely - use a cane or walker - use a nightlight in the bathroom -call neurologist for an appointment for follow up for new onset of foot paralysis and dragging of right foot.     Why is this important?   Most falls happen when it is hard for you to  walk safely. Your balance may be off because of an illness. You may have pain in your knees, hip or other joints.  You may be overly tired or taking medicines that make you sleepy. You may not be able to see or hear clearly.  Falls can lead to broken bones, bruises or other injuries.  There are things you can do to help prevent falling.     Notes: 03-01-2021: The patient is having issues with her right foot with "paralysis and dragging", this has caused her to fall several times. The patient states that she is concerned about her health and is wanting help with the management of symptoms. Agrees to work with the CCM team for effective management of health and well being and to prevent falls and injuries. 04-12-2021: The patient states that she has "foot drop" and had a light stroke.  Her doctor has recommended a support that will reduce her % of falls by 50%. She says she is doing better. No new falls  since last outreach. She is being careful. Will continue to monitor.         Patient verbalizes understanding of instructions provided today and agrees to view in [place].   Telephone follow up appointment with care management team member scheduled for: 06-14-2021 at 1 pm  Noreene Larsson RN, MSN, [facility] Mobile: [phone]

## 2021-04-12 NOTE — Chronic Care Management (AMB) (Signed)
Care Management    RN Visit Note  04/12/2021 Name: Amy Hansen MRN: [id number] DOB: [birth year]  Subjective: Amy Hansen is a 65 y.o. year old female who is a primary care patient of [name], NP. The care management team was consulted for assistance with disease management and care coordination needs.    Engaged with patient by telephone for follow up visit in response to provider referral for case management and/or care coordination services.   Consent to Services:   Amy Hansen was given information about Care Management services today including:  Care Management services includes personalized support from designated clinical staff supervised by her physician, including individualized plan of care and coordination with other care providers 24/7 contact phone numbers for assistance for urgent and routine care needs. The patient may stop case management services at any time by phone call to the office staff.  Patient agreed to services and consent obtained.   Assessment: Review of patient past medical history, allergies, medications, health status, including review of consultants reports, laboratory and other test data, was performed as part of comprehensive evaluation and provision of chronic care management services.   SDOH (Social Determinants of Health) assessments and interventions performed:  SDOH Interventions    Flowsheet Row Most Recent Value  SDOH Interventions   Physical Activity Interventions Other (Comments)  [no structured activity]  Social Connections Interventions Other (Comment)  [good support system]        Care Plan  Allergies  Allergen Reactions   Avelox [Moxifloxacin Hcl In Nacl] Anaphylaxis   Bactrim [Sulfamethoxazole-Trimethoprim] Anaphylaxis   Ciprofloxacin Other (See Comments)    Pt states she was told never to take this as it is in the same family as Avelox.    Depakote [Divalproex Sodium] Other (See Comments)    Unknown Reaction    Imitrex [Sumatriptan] Other (See Comments)    Neck and shoulder pain   Stadol [Butorphanol] Rash    Outpatient Encounter Medications as of 04/12/2021  Medication Sig   ALPRAZolam (XANAX) 1 MG tablet Take 1 tablet (1 mg total) by mouth 3 (three) times daily as needed for anxiety.   buPROPion (WELLBUTRIN XL) 150 MG 24 hr tablet Take 3 tablets by mouth once daily   Cholecalciferol (VITAMIN D) 50 MCG (2000 UT) CAPS Take 1 capsule (2,000 Units total) by mouth daily.   dicyclomine (BENTYL) 10 MG capsule Take 1 capsule (10 mg total) by mouth 3 (three) times daily as needed for spasms.   fluconazole (DIFLUCAN) 150 MG tablet Take 1 tablet by mouth for 1 day, then repeat in 5 days if needed.   furosemide (LASIX) 20 MG tablet Take 1 tablet (34m) daily. Take an additional tablet (272m AS NEEDED.   gabapentin (NEURONTIN) 100 MG capsule Take 1 mg by mouth as needed.   glipiZIDE (GLUCOTROL) 5 MG tablet Take 1 tablet by mouth twice daily.   insulin glargine, 2 Unit Dial, (TOUJEO MAX SOLOSTAR) 300 UNIT/ML Solostar Pen Inject 24 units daily. Titrate as instructed. Max daily dose 40 units   Insulin Pen Needle (PEN NEEDLES) 32G X 4 MM MISC 1 Units by Does not apply route at bedtime.   lamoTRIgine (LAMICTAL) 200 MG tablet Take 2 tablets (400 mg total) by mouth at bedtime.   metFORMIN (GLUCOPHAGE) 1000 MG tablet Take 1 tablet by mouth twice daily.   ondansetron (ZOFRAN) 4 MG tablet Take 1 tablet (4 mg total) by mouth daily as needed for nausea or vomiting.   pramipexole (MIRAPEX) 1  MG tablet Take 1 tablet (1 mg total) by mouth at bedtime.   predniSONE (DELTASONE) 20 MG tablet Take 1 tablet (20 mg total) by mouth daily with breakfast.   risperiDONE (RISPERDAL) 3 MG tablet Take 1 tablet (3 mg total) by mouth at bedtime.   sertraline (ZOLOFT) 100 MG tablet Take 2 tablets (200 mg total) by mouth daily.   No facility-administered encounter medications on file as of 04/12/2021.    Patient Active Problem List    Diagnosis Date Noted   TIA (transient ischemic attack) 02/20/2021   Protein-calorie malnutrition, severe 02/09/2021   Abnormal CT scan, gastrointestinal tract    BRBPR (bright red blood per rectum) 05/01/2020   Weakness 04/25/2020   Pancolitis ([place]) 04/25/2020   Type 2 diabetes mellitus ([place]) 04/25/2020   Colitis 04/21/2020   AKI (acute kidney injury) ([place]) 04/21/2020   Major depression, chronic 06/01/2018   OCD (obsessive compulsive disorder) 06/01/2018   Chronic heart failure with preserved ejection fraction ([place]) 05/16/2018   Left leg pain 11/04/2017   GERD (gastroesophageal reflux disease) 02/18/2017   Iron deficiency anemia 10/18/2016   Leg swelling 07/17/2016   Hypertension 03/21/2016   Vitamin D deficiency 02/21/2016   B12 deficiency 02/21/2016   Poorly controlled type 2 diabetes mellitus ([place]) 09/13/2015   Migraines 05/23/2015   Restless legs syndrome (RLS) 05/23/2015   Recurrent UTI 04/26/2015   Atrophic vaginitis 04/26/2015   Dyspareunia, female 04/26/2015    Conditions to be addressed/monitored: HTN, DMII, Depression, and Colitis, Chron's and falls risk  Care Plan : RNCM: Diabetes Type 2 (Adult)  Updates made by Vanita Ingles, RN since 04/12/2021 12:00 AM     Problem: RNCM: Glycemic Management (Diabetes, Type 2)   Priority: Medium     Long-Range Goal: RNCM: Glycemic Management Optimized   Start Date: 03/01/2021  Expected End Date: 03/01/2022  This Visit's Progress: Not on track  Recent Progress: Not on track  Priority: Medium  Note:   Objective:  Lab Results  Component Value Date   HGBA1C 11.7 (H) 02/09/2021     Lab Results  Component Value Date   CREATININE 0.72 03/30/2021   CREATININE 0.60 02/20/2021   CREATININE 0.51 02/09/2021    No results found for: EGFR Current Barriers:  Knowledge Deficits related to basic Diabetes pathophysiology and self care/management Knowledge Deficits related to medications used for management of diabetes Limited Social  Support Unable to independently manage DM Does not contact provider office for questions/concerns Frequent exacerbations of colitis with steroid therapy making glucose levels elevated Case Manager Clinical Goal(s):  Collaboration with [name], NP regarding development and update of comprehensive plan of care as evidenced by provider attestation and co-signature Inter-disciplinary care team collaboration (see longitudinal plan of care) patient will demonstrate improved adherence to prescribed treatment plan for diabetes self care/management as evidenced by:  daily monitoring and recording of CBG  adherence to ADA/ carb modified diet exercise 2/3 days/week adherence to prescribed medication regimen Interventions:  Provided education to patient about basic DM disease process Reviewed medications with patient and discussed importance of medication adherence. 12-02-2020: The patient is compliant with medications. The patient is currently experiencing and exacerbation in her colitis and is going to start a prednisone dose pack. Review of action plan provided by the pcp on how to take her insulin for a goal of keeping blood sugar <130.  The patient forgot that her blood sugars would elevate with the steroid therapy. Extensive education and support given. Advised the patient to call  the office for questions or concerns related to elevated blood sugars.  The patient verbalized understanding. 03-01-2021: The patient has not been taking her medications at night because during the night the patient has been having lows in the 30's to 50's and she is afraid to take the medications because of that. She is having highs during the day. Secure chat with the NP and the NP would like for the patient to come in for a visit or do a virtual visit. Advised the patient of the need for a follow up and the patient agreed. 04-12-2021: The patient is on prednisone and an abx for UTI. The patient is having erratic blood  sugars. The patient states that it will be around 66 in the am and then shoot up to 300. The patient states she is having a hard time with it. Follows up with the pcp on 04-19-2021. Encouraged the patient to keep appointment. The patient knows how to effectively manage high and low blood sugars.  Discussed plans with patient for ongoing care management follow up and provided patient with direct contact information for care management team Provided patient with written educational materials related to hypo and hyperglycemia and importance of correct treatment. 12-02-2020: Education on monitoring for elevated blood sugars especially with the use of prednisone. The patient is aware of sx and sx and has action plan in place.03-01-2021: The patient is having highs during the day. The patients meter this am would not register a reading. After taking 10 units of insulin it still was in the 400's all day. The patient is on prednisone for a colitis flare up currently. The patient is very concerned because at night her blood sugars are 35, 40's, and 50's during the middle of the night. She has been eating a snack before going to bed. Education provided on making sure she takes her medications as prescribed, eating a bedtime snack and coming in to see the pcp for follow up. The patient verbalized understanding. 04-12-2021: The patient is still having fluctuations of blood sugars. Is hopeful the infusions she is starting on the 21st for her colitis will help with management of her colitis therefore she will not have to take prednisone. The patient having range of 66 to 300. Knows how to respond to high and low blood sugars.  Reviewed scheduled/upcoming provider appointments including: 04-19-2021 at [place] am Advised patient, providing education and rationale, to check cbg daily  and record, calling pcp for findings outside established parameters.  03-01-2021: The patient is having a new exacerbation of her colitis and is being  followed by her GI provider is taking prednisone.  Review of blood sugar readings the patient is having very highs and during the middle of the night very lows.  Reminded the patient of blood sugar elevations with taking prednisone and to follow the action plan by the pcp. Review of what worse looks like and to seek emergent help if needed. Discussed with pcp about the patients concerns and she advised the patient come in for evaluation. 04-12-2021: No current exacerbation of colitis but the patient is on prednisone and has been taking abx for a UTI. Starts a new infusion next week for her colitis. She is having variations in blood sugar. Encouraged the patient to discuss with the pcp at appointment on 04-19-2021. Review of patient status, including review of consultants reports, relevant laboratory and other test results, and medications completed. Patient Goals/Self-Care Activities  patient will:  - Self administers oral medications as  prescribed Self administers insulin as prescribed Attends all scheduled provider appointments Checks blood sugars as prescribed and utilize hyper and hypoglycemia protocol as needed Adheres to prescribed ADA/carb modified - barriers to adherence to treatment plan identified-04-12-2021: Continued prednisone use - blood glucose monitoring encouraged - blood glucose readings reviewed- 04-12-2021: The patient is having readings of 66 in am to 300 in the afternoon, during the night levels are dropping to lows- 30-50's. Will follow up with the pcp next week. Encouraged her to discuss options for better control with the pcp.  - mutual A1C goal set or reviewed. 04-12-2021: Goal of A1C is <7.0 - resources required to improve adherence to care identified - self-awareness of signs/symptoms of hypo or hyperglycemia encouraged - use of blood glucose monitoring log promoted Follow Up Plan: Telephone follow up appointment with care management team member scheduled for: 06-14-2021 at 1  pm    Care Plan : RNCM: Hypertension (Adult)  Updates made by Vanita Ingles, RN since 04/12/2021 12:00 AM     Problem: RNCM: Hypertension (Hypertension)   Priority: Medium     Long-Range Goal: RNCM: Hypertension Monitored   Start Date: 03/01/2021  Expected End Date: 03/01/2022  This Visit's Progress: On track  Recent Progress: On track  Priority: Medium  Note:   Objective:  Last practice recorded BP readings:  BP Readings from Last 3 Encounters:  03/30/21 (!) 100/56  02/20/21 110/72  02/10/21 121/74     Most recent eGFR/CrCl: No results found for: EGFR  No components found for: CRCL Current Barriers:  Knowledge Deficits related to basic understanding of hypertension pathophysiology and self care management Knowledge Deficits related to understanding of medications prescribed for management of hypertension Limited Social Support Unable to self administer medications as prescribed Lacks social connections Does not contact provider office for questions/concerns Case Manager Clinical Goal(s):   patient will verbalize understanding of plan for hypertension management  patient will demonstrate improved adherence to prescribed treatment plan for hypertension as evidenced by taking all medications as prescribed, monitoring and recording blood pressure as directed, adhering to low sodium/DASH diet  patient will demonstrate improved health management independence as evidenced by checking blood pressure as directed and notifying PCP if SBP>160 or DBP > 90, taking all medications as prescribe, and adhering to a low sodium diet as discussed. Interventions:  Collaboration with [name], NP regarding development and update of comprehensive plan of care as evidenced by provider attestation and co-signature Inter-disciplinary care team collaboration (see longitudinal plan of care) Evaluation of current treatment plan related to hypertension self management and patient's adherence to plan  as established by provider. 04-12-2021: The patient states she is doing well with managing her blood pressure. Is having swelling in her feet and legs likely due to her chronic heart failure, not worse than usual. Education on elevation of feet and legs when sitting, compliance with heart healthy/ADA diet, medications compliance, use of ted hose or compression socks, and calling provider for changes.  Provided education to patient re: stroke prevention, s/s of heart attack and stroke, DASH diet, complications of uncontrolled blood pressure Reviewed medications with patient and discussed importance of compliance.04-12-2021: The patient states that she is compliant with cardiac medications  Discussed plans with patient for ongoing care management follow up and provided patient with direct contact information for care management team Advised patient, providing education and rationale, to monitor blood pressure daily and record, calling PCP for findings outside established parameters.  Patient Goals/Self-Care Activities  patient will:  -  Self administers medications as prescribed Attends all scheduled provider appointments Calls provider office for new concerns, questions, or BP outside discussed parameters Checks BP and records as discussed Follows a low sodium diet/DASH diet - blood pressure trends reviewed - depression screen reviewed - home or ambulatory blood pressure monitoring encouraged Follow Up Plan: Telephone follow up appointment with care management team member scheduled for: 04-12-2021 at 1 pm    Care Plan : RNCM: Effective management of Colitis/Chron's  Updates made by Vanita Ingles, RN since 04/12/2021 12:00 AM     Problem: RNCM: Health Promotion or Disease Self-Management (General Plan of Care) Colitis and Chron's management   Priority: High     Long-Range Goal: RNCM: Self-Management Plan Developed   Start Date: 03/01/2021  Expected End Date: 03/01/2022  This Visit's Progress: On  track  Recent Progress: On track  Priority: High  Note:   Current Barriers:  Knowledge Deficits related to resources and education on effective management of colitis and chron's  Chronic Disease Management support and education needs related to colitis and chron's Lacks caregiver support.  Unable to independently manage exacerbations of colitis/chron's Lacks social connections Does not contact provider office for questions/concerns  Nurse Case Manager Clinical Goal(s):  patient will verbalize understanding of plan for effective management of chron's and colitis  patient will work with [place], and pcp to address needs related to Management of chron's and colitis patient will demonstrate a decrease in colitis/chron's  exacerbations as evidenced by effective management of sx/sx, taking medications as prescribed, managing diet, and keeping appointments   patient will verbalize basic understanding of colitis/chron's  disease process and self health management plan as evidenced by no acute exacerbations and stabilization of disease process  Interventions:  1:1 collaboration with [name], NP regarding development and update of comprehensive plan of care as evidenced by provider attestation and co-signature Inter-disciplinary care team collaboration (see longitudinal plan of care) Evaluation of current treatment plan related to effective management of colitis and chron's disease  and patient's adherence to plan as established by provider. 03-01-2021: The patient is currently having an exacerbation of her colitis. She was in the hospital in July for colitis flare up. Is currently on a prednisone dose pack. Reminded the patient of the effects of prednisone on blood sugars and DM health. The patient advised to follow recommendations by pcp and specialist. 04-12-2021: The patient is not having a flare up but on prednisone. Her insurance has approved for her to have the infusions for colitis and she is  having her first one on the 21st. She is hopeful this will help and she will not have further exacerbations of her colitis. She also sees the pcp on 04-19-2021.  Advised patient to call the provider for changes in condition, worsening sx/sx or exacerbations. 04-12-2021: Reviewed  Provided education to patient re: dietary support with colitis and chron's. 03-01-2021: Education on staying hydrated and watching dietary restrictions of salt, fats, and sugars. Discussed bland foods and staying away from spicy foods and fried foods that can exacerbate her colitis 04-12-2021: Denies any exacerbation at this time. Is on prednisone. Is having fluctuations with her blood sugars.  Discussed plans with patient for ongoing care management follow up and provided patient with direct contact information for care management team  Patient Goals/Self-Care Activities  patient will:  - Patient will self administer medications as prescribed Patient will attend all scheduled provider appointments Patient will call pharmacy for medication refills Patient will call provider office for  new concerns or questions Patient will work with BSW to address care coordination needs and will continue to work with the clinical team to address health care and disease management related needs.   - barriers to meeting goals identified- current flare up (12-02-2020)  04-12-2021: No current flare ups at this time - choices provided - collaboration with team encouraged - decision-making supported - health risks reviewed - problem-solving facilitated - questions answered - readiness for change evaluated - reassurance provided - resources needed to meet goals identified - self-reflection promoted - self-reliance encouraged - verbalization of feelings encouraged  Follow Up Plan: Telephone follow up appointment with care management team member scheduled for: 06-14-2021 at 1 pm        Care Plan : RNCM: Fall Risk (Adult)  Updates made by  Vanita Ingles, RN since 04/12/2021 12:00 AM     Problem: RNCM: Fall Risk   Priority: High  Onset Date: 03/01/2021     Long-Range Goal: RNCM: Absence of Fall and Fall-Related Injury   Start Date: 03/01/2021  Expected End Date: 03/01/2022  This Visit's Progress: On track  Recent Progress: Not on track  Priority: High  Note:   Current Barriers:  Knowledge Deficits related to fall precautions in patient with  Decreased adherence to prescribed treatment for fall prevention Unable to independently manage falls prevention due to onset of right foot dragging and "paralysis"  Unable to self administer medications as prescribed Does not adhere to prescribed medication regimen Does not contact provider office for questions/concerns Falls x 2 on 02-26-2021 and falls before that . 04-12-2021: Denies any new falls at this time.  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  Chronic Disease Management support and education needs related to multiple chronic conditions impacting her care Non-adherence to prescribed medication regimen Frequent falls and high fall risk due to right foot dragging and paraysis  Clinical Goal(s):  patient will demonstrate improved adherence to prescribed treatment plan for decreasing falls as evidenced by patient reporting and review of EMR patient will verbalize using fall risk reduction strategies discussed patient will not experience additional falls patient will verbalize understanding of plan for effective management of preventing falls and remaining safe in her environment  patient will work with [facility] and pcp  to address needs related to falls prevention and safety and new onset of right foot dragging and paralysis  patient will demonstrate a decrease in falls  exacerbations patient will take all medications exactly as prescribed and will call provider for medication related questions patient will attend all scheduled medical  appointments: patient to see pcp for follow up, patient to call neurologist to get an appointment for new onset of paralysis and foot dragging  in her right foot patient will demonstrate improved health management independence patient will demonstrate understanding of rationale for each prescribed medication the patient will demonstrate ongoing self health care management ability Interventions:  Collaboration with [name], NP regarding development and update of comprehensive plan of care as evidenced by provider attestation and co-signature Inter-disciplinary care team collaboration (see longitudinal plan of care) Provided written and verbal education re: Potential causes of falls and Fall prevention strategies Reviewed medications and discussed potential side effects of medications such as dizziness and frequent urination Assessed for s/s of orthostatic hypotension Assessed for falls since last encounter. 03-01-2021: Last reported fall x 2 was on 02-26-2021. 04-12-2021: No new falls at this time. Will continue to monitor. The patient states that she  has been diagnosed with foot drop and had a mini stroke. She is wearing a support on her foot and this is supposed to reduce her fall risk by 50%. Assessed patients knowledge of fall risk prevention secondary to previously provided education. Assessed working status of life alert bracelet and patient adherence Provided patient information for fall alert systems Evaluation of current treatment plan related to falls prevention and safety and patient's adherence to plan as established by provider. Advised patient to call the office for new falls, also to call the neurology office to obtain an appointment for recommendations and evaluation Provided education to patient re: safety and using a cane or a walker to help support weak side, preventing fractures or head injuries due to high fall risk Collaborated with pcp regarding changes in condition and  new onset of right foot dragging and paralysis  Discussed plans with patient for ongoing care management follow up and provided patient with direct contact information for care management team Self-Care Deficits:  Unable to independently manage falls as evidence of falls x 2 on 7-31-2022c Does not adhere to prescribed medication regimen Does not contact provider office for questions/concerns Patient Goals:  - Utilize cane or a walker  (assistive device) appropriately with all ambulation - De-clutter walkways - Change positions slowly - Wear secure fitting shoes at all times with ambulation - Utilize home lighting for dim lit areas - Demonstrate self and pet awareness at all times Follow Up Plan: Telephone follow up appointment with care management team member scheduled for: 06-14-2021 at 1 pm     Plan: Telephone follow up appointment with care management team member scheduled for:  06-14-2021 at 1 pm  [place], MSN, [facility] Mobile: [phone]

## 2021-04-14 ENCOUNTER — Encounter: Payer: Self-pay | Admitting: Oncology

## 2021-04-14 ENCOUNTER — Encounter: Payer: Self-pay | Admitting: Gastroenterology

## 2021-04-18 NOTE — Progress Notes (Signed)
BP 108/73   Pulse 87   Temp 98.2 F (36.8 C) (Oral)   Wt 136 lb (61.7 kg)   LMP  (LMP Unknown)   SpO2 97%   BMI 25.70 kg/m    Subjective:    Patient ID: Amy Hansen, female    DOB: [birth year], 65 y.o.   MRN: [id number]  HPI: Amy Hansen is a 65 y.o. female  Chief Complaint  Patient presents with   Diabetes    Patient is here for a follow up on Diabetes. Patient states her readings have been low in the mornings and she states she does not check them in the evenings. Patient states it was 147 this morning. Patient having any other concerns at today's visit. Patient recent Diabetic Eye Exam was requested at today's visit.    DIABETES Patient states she has had several episodes where she has woken up and her sugars are below 60. Hypoglycemic episodes:yes Polydipsia/polyuria: yes Visual disturbance: no Chest pain: no Paresthesias:yes Glucose Monitoring: yes  Accucheck frequency: Daily  Fasting glucose: 120-130  Post prandial:  Evening:  Before meals: Taking Insulin?: yes  Long acting insulin: 30u  Short acting insulin: Blood Pressure Monitoring: not checking  Patient states her infusion of entivio for her chron's disease.  Now that she has insurance the goal is to stop the steroids.  Relevant past medical, surgical, family and social history reviewed and updated as indicated. Interim medical history since our last visit reviewed. Allergies and medications reviewed and updated.  Review of Systems  Eyes:  Negative for visual disturbance.  Cardiovascular:  Negative for chest pain.  Endocrine: Positive for polydipsia and polyuria.  Neurological:  Positive for dizziness, light-headedness and numbness.   Per HPI unless specifically indicated above     Objective:    BP 108/73   Pulse 87   Temp 98.2 F (36.8 C) (Oral)   Wt 136 lb (61.7 kg)   LMP  (LMP Unknown)   SpO2 97%   BMI 25.70 kg/m   Wt Readings from Last 3 Encounters:  04/19/21 136 lb (61.7 kg)   03/30/21 136 lb (61.7 kg)  02/20/21 119 lb (54 kg)    Physical Exam Vitals and nursing note reviewed.  Constitutional:      General: She is not in acute distress.    Appearance: Normal appearance. She is normal weight. She is not ill-appearing, toxic-appearing or diaphoretic.  HENT:     Head: Normocephalic.     Right Ear: External ear normal.     Left Ear: External ear normal.     Nose: Nose normal.     Mouth/Throat:     Mouth: Mucous membranes are moist.     Pharynx: Oropharynx is clear.  Eyes:     General:        Right eye: No discharge.        Left eye: No discharge.     Extraocular Movements: Extraocular movements intact.     Conjunctiva/sclera: Conjunctivae normal.     Pupils: Pupils are equal, round, and reactive to light.  Cardiovascular:     Rate and Rhythm: Normal rate and regular rhythm.     Heart sounds: No murmur heard. Pulmonary:     Effort: Pulmonary effort is normal. No respiratory distress.     Breath sounds: Normal breath sounds. No wheezing or rales.  Musculoskeletal:     Cervical back: Normal range of motion and neck supple.  Skin:    General: Skin is warm  and dry.     Capillary Refill: Capillary refill takes less than 2 seconds.  Neurological:     General: No focal deficit present.     Mental Status: She is alert and oriented to person, place, and time. Mental status is at baseline.  Psychiatric:        Mood and Affect: Mood normal.        Behavior: Behavior normal.        Thought Content: Thought content normal.        Judgment: Judgment normal.    Results for orders placed or performed in visit on 03/30/21  CBC w/Diff  Result Value Ref Range   WBC 9.5 4.0 - 10.5 K/uL   RBC 4.06 3.87 - 5.11 Mil/uL   Hemoglobin 11.2 (L) 12.0 - 15.0 g/dL   HCT 35.1 (L) 36.0 - 46.0 %   MCV 86.4 78.0 - 100.0 fl   MCHC 31.9 30.0 - 36.0 g/dL   RDW 16.6 (H) 11.5 - 15.5 %   Platelets 410.0 (H) 150.0 - 400.0 K/uL   Neutrophils Relative % 82.7 (H) 43.0 - 77.0 %    Lymphocytes Relative 12.2 12.0 - 46.0 %   Monocytes Relative 4.8 3.0 - 12.0 %   Eosinophils Relative 0.1 0.0 - 5.0 %   Basophils Relative 0.2 0.0 - 3.0 %   Neutro Abs 7.9 (H) 1.4 - 7.7 K/uL   Lymphs Abs 1.2 0.7 - 4.0 K/uL   Monocytes Absolute 0.5 0.1 - 1.0 K/uL   Eosinophils Absolute 0.0 0.0 - 0.7 K/uL   Basophils Absolute 0.0 0.0 - 0.1 K/uL  Basic metabolic panel  Result Value Ref Range   Sodium 136 135 - 145 mEq/L   Potassium 3.9 3.5 - 5.1 mEq/L   Chloride 103 96 - 112 mEq/L   CO2 21 19 - 32 mEq/L   Glucose, Bld 235 (H) 70 - 99 mg/dL   BUN 10 6 - 23 mg/dL   Creatinine, Ser 0.72 0.40 - 1.20 mg/dL   GFR 88.73 >60.00 mL/min   Calcium 8.7 8.4 - 10.5 mg/dL  Sed Rate (ESR)  Result Value Ref Range   Sed Rate 47 (H) 0 - 30 mm/hr  C-reactive protein  Result Value Ref Range   CRP 7.4 0.5 - 20.0 mg/dL  Hepatitis B Surface AntiGEN  Result Value Ref Range   Hepatitis B Surface Ag NON-REACTIVE NON-REACTIVE  Hepatitis C Antibody  Result Value Ref Range   Hepatitis C Ab NON-REACTIVE NON-REACTIVE   SIGNAL TO CUT-OFF 0.01 <1.00  QuantiFERON-TB Gold Plus  Result Value Ref Range   QuantiFERON-TB Gold Plus NEGATIVE NEGATIVE   NIL 0.04 IU/mL   Mitogen-NIL >10.00 IU/mL   TB1-NIL 0.00 IU/mL   TB2-NIL 0.00 IU/mL      Assessment & Plan:   Problem List Items Addressed This Visit       Endocrine   Poorly controlled type 2 diabetes mellitus ([place]) - Primary    Chronic.  Not well controlled.  Last A1c 11.4.  Patient has been on Toujeo 30u at bedtime. Will decrease Toujeo to 20u daily due to patient having low blood sugars.  Will draw A1c during visit today.  Now that patient has insurance she will likely be coming off of the prednisone.  Would like to taper Toujeo off completely if A1c improves.  Patient understands and agrees with the plan of care.       Relevant Orders   HgB A1c   Comp Met (CMET)  Follow up plan: Return in about 1 month (around 05/19/2021) for Diabetic  check.

## 2021-04-19 ENCOUNTER — Other Ambulatory Visit: Payer: Self-pay

## 2021-04-19 ENCOUNTER — Ambulatory Visit (INDEPENDENT_AMBULATORY_CARE_PROVIDER_SITE_OTHER): Payer: BC Managed Care – PPO

## 2021-04-19 ENCOUNTER — Ambulatory Visit (INDEPENDENT_AMBULATORY_CARE_PROVIDER_SITE_OTHER): Payer: BC Managed Care – PPO | Admitting: Nurse Practitioner

## 2021-04-19 ENCOUNTER — Encounter: Payer: Self-pay | Admitting: Nurse Practitioner

## 2021-04-19 VITALS — BP 108/73 | HR 87 | Temp 98.2°F | Wt 136.0 lb

## 2021-04-19 VITALS — BP 130/82 | HR 79 | Temp 98.2°F | Resp 18 | Wt 136.2 lb

## 2021-04-19 DIAGNOSIS — K5100 Ulcerative (chronic) pancolitis without complications: Secondary | ICD-10-CM | POA: Diagnosis not present

## 2021-04-19 DIAGNOSIS — E1165 Type 2 diabetes mellitus with hyperglycemia: Secondary | ICD-10-CM | POA: Diagnosis not present

## 2021-04-19 MED ORDER — SODIUM CHLORIDE 0.9 % IV SOLN: Freq: Once | INTRAVENOUS | Status: DC | PRN

## 2021-04-19 MED ORDER — METHYLPREDNISOLONE SODIUM SUCC 125 MG IJ SOLR: 125.0000 mg | Freq: Once | INTRAMUSCULAR | Status: DC | PRN

## 2021-04-19 MED ORDER — SODIUM CHLORIDE 0.9% FLUSH: 10.0000 mL | Freq: Once | INTRAVENOUS | Status: DC | PRN

## 2021-04-19 MED ORDER — VEDOLIZUMAB 300 MG IV SOLR
300.0000 mg | Freq: Once | INTRAVENOUS | Status: AC
  Administered 2021-04-19: 300 mg via INTRAVENOUS
  Filled 2021-04-19: qty 5

## 2021-04-19 MED ORDER — ALBUTEROL SULFATE HFA 108 (90 BASE) MCG/ACT IN AERS: 2.0000 | INHALATION_SPRAY | Freq: Once | RESPIRATORY_TRACT | Status: DC | PRN

## 2021-04-19 MED ORDER — HEPARIN SOD (PORK) LOCK FLUSH 100 UNIT/ML IV SOLN: 250.0000 [IU] | Freq: Once | INTRAVENOUS | Status: DC | PRN

## 2021-04-19 MED ORDER — DIPHENHYDRAMINE HCL 50 MG/ML IJ SOLN: 50.0000 mg | Freq: Once | INTRAMUSCULAR | Status: DC | PRN

## 2021-04-19 MED ORDER — FAMOTIDINE IN NACL 20-0.9 MG/50ML-% IV SOLN: 20.0000 mg | Freq: Once | INTRAVENOUS | Status: DC | PRN

## 2021-04-19 MED ORDER — HEPARIN SOD (PORK) LOCK FLUSH 100 UNIT/ML IV SOLN: 500.0000 [IU] | Freq: Once | INTRAVENOUS | Status: DC | PRN

## 2021-04-19 MED ORDER — ALTEPLASE 2 MG IJ SOLR: 2.0000 mg | Freq: Once | INTRAMUSCULAR | Status: DC | PRN

## 2021-04-19 MED ORDER — EPINEPHRINE 0.3 MG/0.3ML IJ SOAJ: 0.3000 mg | Freq: Once | INTRAMUSCULAR | Status: DC | PRN

## 2021-04-19 MED ORDER — SODIUM CHLORIDE 0.9% FLUSH: 3.0000 mL | Freq: Once | INTRAVENOUS | Status: DC | PRN

## 2021-04-19 MED ORDER — ANTICOAGULANT SODIUM CITRATE 4% (200MG/5ML) IV SOLN: 5.0000 mL | Freq: Once | Status: DC | PRN

## 2021-04-19 NOTE — Assessment & Plan Note (Signed)
Chronic.  Not well controlled.  Last A1c 11.4.  Patient has been on Toujeo 30u at bedtime. Will decrease Toujeo to 20u daily due to patient having low blood sugars.  Will draw A1c during visit today.  Now that patient has insurance she will likely be coming off of the prednisone.  Would like to taper Toujeo off completely if A1c improves.  Patient understands and agrees with the plan of care.

## 2021-04-19 NOTE — Progress Notes (Signed)
Diagnosis: Crohn's Disease  Provider:  Marshell Garfinkel, MD  Procedure: Infusion  IV Type: Peripheral, IV Location: L Antecubital  Entyvio (Vedolizumab), Dose: 300 mg  Infusion Start Time: 8466  Infusion Stop Time: 1425  Post Infusion IV Care: Observation period completed and Peripheral IV Discontinued  Discharge: Condition: Good, Destination: Home . AVS provided to patient.   Performed by:  Charlie Pitter, RN

## 2021-04-19 NOTE — Assessment & Plan Note (Signed)
>>  ASSESSMENT AND PLAN FOR DIABETES MELLITUS TYPE 2 IN NONOBESE ([facility]) WRITTEN ON 04/19/2021 10:24 AM BY HOLDSWORTH, KAREN, NP  Chronic.  Not well controlled.  Last A1c 11.4.  Patient has been on Toujeo 30u at bedtime. Will decrease Toujeo to 20u daily due to patient having low blood sugars.  Will draw A1c during visit today.  Now that patient has insurance she will likely be coming off of the prednisone.  Would like to taper Toujeo off completely if A1c improves.  Patient understands and agrees with the plan of care.

## 2021-04-20 ENCOUNTER — Encounter: Payer: Self-pay | Admitting: Physician Assistant

## 2021-04-20 ENCOUNTER — Other Ambulatory Visit: Payer: Self-pay

## 2021-04-20 ENCOUNTER — Ambulatory Visit (INDEPENDENT_AMBULATORY_CARE_PROVIDER_SITE_OTHER): Payer: BC Managed Care – PPO

## 2021-04-20 ENCOUNTER — Ambulatory Visit (INDEPENDENT_AMBULATORY_CARE_PROVIDER_SITE_OTHER): Payer: BC Managed Care – PPO | Admitting: Physician Assistant

## 2021-04-20 DIAGNOSIS — I5032 Chronic diastolic (congestive) heart failure: Secondary | ICD-10-CM | POA: Diagnosis not present

## 2021-04-20 DIAGNOSIS — F411 Generalized anxiety disorder: Secondary | ICD-10-CM

## 2021-04-20 DIAGNOSIS — G4700 Insomnia, unspecified: Secondary | ICD-10-CM

## 2021-04-20 DIAGNOSIS — G2581 Restless legs syndrome: Secondary | ICD-10-CM

## 2021-04-20 DIAGNOSIS — F319 Bipolar disorder, unspecified: Secondary | ICD-10-CM

## 2021-04-20 DIAGNOSIS — R600 Localized edema: Secondary | ICD-10-CM | POA: Diagnosis not present

## 2021-04-20 DIAGNOSIS — R69 Illness, unspecified: Secondary | ICD-10-CM

## 2021-04-20 LAB — ECHOCARDIOGRAM COMPLETE
AR max vel: 2.71 cm2
AV Area VTI: 2.49 cm2
AV Area mean vel: 2.73 cm2
AV Mean grad: 5 mmHg
AV Peak grad: 8.5 mmHg
Ao pk vel: 1.46 m/s
Area-P 1/2: 3.93 cm2
Calc EF: 64.3 %
S' Lateral: 2.1 cm
Single Plane A2C EF: 72.1 %
Single Plane A4C EF: 55.3 %

## 2021-04-20 LAB — COMPREHENSIVE METABOLIC PANEL
ALT: 111 IU/L — ABNORMAL HIGH (ref 0–32)
AST: 71 IU/L — ABNORMAL HIGH (ref 0–40)
Albumin/Globulin Ratio: 1.7 (ref 1.2–2.2)
Albumin: 4.1 g/dL (ref 3.8–4.8)
Alkaline Phosphatase: 121 IU/L (ref 44–121)
BUN/Creatinine Ratio: 15 (ref 12–28)
BUN: 10 mg/dL (ref 8–27)
Bilirubin Total: <0.2 mg/dL (ref 0.0–1.2)
CO2: 21 mmol/L (ref 20–29)
Calcium: 9 mg/dL (ref 8.7–10.3)
Chloride: 100 mmol/L (ref 96–106)
Creatinine, Ser: 0.66 mg/dL (ref 0.57–1.00)
Globulin, Total: 2.4 g/dL (ref 1.5–4.5)
Glucose: 164 mg/dL — ABNORMAL HIGH (ref 65–99)
Potassium: 4.6 mmol/L (ref 3.5–5.2)
Sodium: 137 mmol/L (ref 134–144)
Total Protein: 6.5 g/dL (ref 6.0–8.5)
eGFR: 99 mL/min/{1.73_m2} (ref >59)

## 2021-04-20 LAB — HEMOGLOBIN A1C
Est. average glucose Bld gHb Est-mCnc: 209 mg/dL
Hgb A1c MFr Bld: 8.9 % — ABNORMAL HIGH (ref 4.8–5.6)

## 2021-04-20 MED ORDER — QUETIAPINE FUMARATE 25 MG PO TABS: 25.0000 mg | ORAL_TABLET | Freq: Every day | ORAL | 1 refills | Status: DC

## 2021-04-20 MED ORDER — ALPRAZOLAM 1 MG PO TABS: 1.0000 mg | ORAL_TABLET | Freq: Three times a day (TID) | ORAL | 5 refills | Status: DC | PRN

## 2021-04-20 NOTE — Progress Notes (Signed)
Hi Amy Hansen. It was good to see you yesterday.  Your A1c has improved from 11.7 to 8.9 over the last two months which is great news. Continue to cut back on the Toujeo to 20u daily.  We will follow up in 1 month.  If you are still on your steroids at that point we will consider adding another medication with less risk of hypoglycemia.  Please let me know if you have any questions.

## 2021-04-20 NOTE — Progress Notes (Signed)
[facility] Check  Patient ID: Amy Hansen,  MRN: [id number]  PCP: [name], NP  Date of Evaluation: 04/20/2021 Time spent:40 minutes  Chief Complaint:  Chief Complaint   Insomnia     HISTORY/CURRENT STATUS: For 6 week med check.  At last office visit 6 weeks ago the Zoloft and Xanax were increased.  States anxiety is better controlled since the increased medications.  She does still need the Xanax routinely but it is more helpful now at a little bit higher dose.  States she feels better mentally and emotionally.  Is somewhat able to enjoy things.  Her physical health has improved with the Crohn's and ulcerative colitis so that has decreased the anxiety and depression as well.  Energy and motivation are a little better.  She does have chronic fatigue though.  Appetite is normal.  Not isolating.  Does not cry easily.  Denies suicidal or homicidal thoughts.  Her biggest problem right now is insomnia.  She has only been getting 3 to 4 hours of sleep every night and is exhausted.  She has trouble going to sleep and staying asleep.  She has only been taking the Mirapex as needed, prescribed by a different provider for restless leg syndrome.  May have been taking it more routinely up until several weeks ago, the insomnia worsened around that time.  Not having a lot of problems with restless leg at this point.  Patient denies increased energy with decreased need for sleep, no increased talkativeness, no racing thoughts, no impulsivity or risky behaviors, no increased spending, no increased libido, no grandiosity, no increased irritability or anger, no paranoia, and no hallucinations.  Review of Systems  Constitutional:  Positive for malaise/fatigue.  HENT: Negative.    Eyes: Negative.   Respiratory: Negative.    Cardiovascular: Negative.   Gastrointestinal: Negative.  Negative for abdominal pain and diarrhea.  Genitourinary: Negative.   Musculoskeletal: Negative.   Negative for falls.       See HPI  Skin: Negative.   Neurological: Negative.  Negative for weakness.  Endo/Heme/Allergies: Negative.   Psychiatric/Behavioral:  Negative for depression. The patient has insomnia. The patient is not nervous/anxious.        See HPI.   Individual Medical History/ Review of Systems: Changes? :No      Past medications for mental health diagnoses include: Trazodone, Risperdal, Zoloft, Lunesta, prazosin, Sonata, Prozac, Depakote, Lamictal, lithium, Wellbutrin, Xanax, Ambien, carbamazepine  Allergies: Avelox [moxifloxacin hcl in nacl], Bactrim [sulfamethoxazole-trimethoprim], Ciprofloxacin, Depakote [divalproex sodium], Imitrex [sumatriptan], and Stadol [butorphanol]  Current Medications:  Current Outpatient Medications:    buPROPion (WELLBUTRIN XL) 150 MG 24 hr tablet, Take 3 tablets by mouth once daily, Disp: 270 tablet, Rfl: 3   Cholecalciferol (VITAMIN D) 50 MCG (2000 UT) CAPS, Take 1 capsule (2,000 Units total) by mouth daily., Disp: 30 capsule, Rfl:    dicyclomine (BENTYL) 10 MG capsule, Take 1 capsule (10 mg total) by mouth 3 (three) times daily as needed for spasms., Disp: 90 capsule, Rfl: 4   furosemide (LASIX) 20 MG tablet, Take 1 tablet (94m) daily. Take an additional tablet (253m AS NEEDED., Disp: 90 tablet, Rfl: 0   gabapentin (NEURONTIN) 100 MG capsule, Take 1 mg by mouth as needed., Disp: , Rfl:    glipiZIDE (GLUCOTROL) 5 MG tablet, Take 1 tablet by mouth twice daily., Disp: 180 tablet, Rfl: 1   insulin glargine, 2 Unit Dial, (TOUJEO MAX SOLOSTAR) 300 UNIT/ML Solostar Pen, Inject 24 units daily. Titrate as instructed. Max  daily dose 40 units, Disp: 12 mL, Rfl: 1   Insulin Pen Needle (PEN NEEDLES) 32G X 4 MM MISC, 1 Units by Does not apply route at bedtime., Disp: 100 each, Rfl: 1   lamoTRIgine (LAMICTAL) 200 MG tablet, Take 2 tablets (400 mg total) by mouth at bedtime., Disp: 60 tablet, Rfl: 11   metFORMIN (GLUCOPHAGE) 1000 MG tablet, Take 1 tablet  by mouth twice daily., Disp: 180 tablet, Rfl: 1   ondansetron (ZOFRAN) 4 MG tablet, Take 1 tablet (4 mg total) by mouth daily as needed for nausea or vomiting., Disp: 30 tablet, Rfl: 1   pramipexole (MIRAPEX) 1 MG tablet, Take 1 tablet (1 mg total) by mouth at bedtime., Disp: 90 tablet, Rfl: 0   predniSONE (DELTASONE) 20 MG tablet, Take 1 tablet (20 mg total) by mouth daily with breakfast., Disp: 30 tablet, Rfl: 2   QUEtiapine (SEROQUEL) 25 MG tablet, Take 1-2 tablets (25-50 mg total) by mouth at bedtime. As needed for sleep, Disp: 20 tablet, Rfl: 1   risperiDONE (RISPERDAL) 3 MG tablet, Take 1 tablet (3 mg total) by mouth at bedtime., Disp: 30 tablet, Rfl: 5   sertraline (ZOLOFT) 100 MG tablet, Take 2 tablets (200 mg total) by mouth daily., Disp: 60 tablet, Rfl: 11   ALPRAZolam (XANAX) 1 MG tablet, Take 1 tablet (1 mg total) by mouth 3 (three) times daily as needed for anxiety., Disp: 90 tablet, Rfl: 5 Medication Side Effects: none  Family Medical/ Social History: Changes?  No  MENTAL HEALTH EXAM:  There were no vitals taken for this visit.There is no height or weight on file to calculate BMI.  General Appearance: Casual, Neat, and Well Groomed  Eye Contact:  Good  Speech:  Clear and Coherent and Normal Rate  Volume:  Normal  Mood:  Euthymic  Affect:  Appropriate  Thought Process:  Goal Directed and Descriptions of Associations: Intact  Orientation:  Full (Time, Place, and Person)  Thought Content: Logical   Suicidal Thoughts:  No  Homicidal Thoughts:  No  Memory:  WNL  Judgement:  Good  Insight:  Good  Psychomotor Activity:  Normal  Concentration:  Concentration: Good and Attention Span: Good  Recall:  Good  Fund of Knowledge: Good  Language: Good  Assets:  Desire for Improvement  ADL's:  Intact  Cognition: WNL  Prognosis:  Good   Labs 04/19/2021 Hgb A1C 8.9 down from 11.7 done 2 months ago. Glu 164, AST 71, ALT 111, o/w nl.  DIAGNOSES:    ICD-10-CM   1. Bipolar I  disorder ([name])  F31.9     2. Restless legs syndrome ([facility])  G25.81     3. Generalized anxiety disorder  F41.1     4. Insomnia, unspecified type  G47.00     5. Chronic illness  R69       Receiving Psychotherapy: No    RECOMMENDATIONS:  PDMP reviewed.  Last Xanax filled 04/02/2021. I provided 40 minutes of face to face time during this encounter, including time spent before and after the visit in records review, complex medical decision making, counseling pertinent to today's visit, and charting.  We discussed the insomnia.  First of all I think taking the Mirapex routinely will help with the insomnia.  If that is not effective after 2 nights, stop the Mirapex and increase the gabapentin to 600 mg 2-4 nights before wanting to go to sleep.  If that is not effective after 2 nights then get the Seroquel filled and take  that as needed.  She understands. Continue Xanax 1 mg, 1 po tid prn. Or ok to take 1/2 pill, 6 times per day. Not over 3 pills per day. Continue Wellbutrin XL 150 mg, 3 p.o. daily. Continue Lamictal 200 mg, 1 p.o. twice daily. Continue Mirapex 1 mg p.o. nightly but take routinely not as needed. Continue Risperdal 3 mg nightly.   Continue Zoloft 100 mg, 2 p.o. daily. Start Seroquel 25 mg, 1-2 p.o. nightly as directed.  Increase Gabapentin to 600 mg (see above.) Recommend counseling. Return in 3 months.  Donnal Moat, PA-C

## 2021-04-20 NOTE — Patient Instructions (Signed)
Re-start the Mirapex at time.  If it does not help with insomnia after tonight's, stop it and start the gabapentin.  Take 600 mg approximately 2 to 4 hours before you want to go to sleep.  Do that for 2 nights and if that does not help you sleep then get the Seroquel filled

## 2021-04-24 ENCOUNTER — Other Ambulatory Visit: Payer: Self-pay

## 2021-04-24 ENCOUNTER — Telehealth: Payer: Self-pay | Admitting: Physician Assistant

## 2021-04-24 NOTE — Telephone Encounter (Signed)
Have her stay at the 25-50 mgs. We  just started the Seroquel 3-4 days ago and I do not want her to become tolerant to it and continue to need a higher dose.

## 2021-04-24 NOTE — Telephone Encounter (Signed)
Amy Hansen called and said that she has increased the seroquel to 100 mg for sleep. She needs a script of that dose to be sent into the [facility] on [address] in [place]

## 2021-04-24 NOTE — Telephone Encounter (Signed)
Pt was taking 25-50 mg at hs of Seroquel. Pt now asking for higher dose of 100 mg.   I started to change it but wanted to double check on her.

## 2021-04-25 NOTE — Telephone Encounter (Signed)
Rtc to pt and she reports trying the 25-50 mg Seroquel first few nights and was not helping, then tried 75 mg and when she got to 100 mg she was able to sleep all through the night. Her daughter also takes 100 mg Seroquel for sleep. Advised her Amy Hansen preferred she stayed at lower doses. But I would inform  Amy Hansen with her information on sleep.

## 2021-04-25 NOTE — Telephone Encounter (Signed)
Pt was supposed to try taking the Mirapex qhs first for a few nights, if that wasn't effective then use the Seroquel.  She did not give the Mirapex time to work, if she tried it at all, I am not sure.  At any rate I am going to hold off on increasing dose, I would prefer her use the Mirapex for sleep as well as restless leg, just because she is on so many medications already, including another atypical antipsychotic.

## 2021-04-26 NOTE — Telephone Encounter (Signed)
Pt called requesting Rx for Seroquel 100 mg. Pt stated out and can not use refill on the 25 mg Rx, too early. Had to take 100 mg to even help.  Contact # [phone]. [place].

## 2021-04-26 NOTE — Telephone Encounter (Signed)
Rtc to pt and discussed if tried the Mirapex, she reports she does take that as needed for restless legs but it's not helped with sleep. Doesn't  understand why she can't get more Seroquel. She won't have any for tonight, she reports she finally finds something to help with sleep and isn't able to get a Rx.  Informed her I would update Helene Kelp and f/up asap.

## 2021-04-27 NOTE — Telephone Encounter (Signed)
Amy Hansen called again this morning wanting to know why she can't get a refill of her seroquel.  She repeated that the 161m is working.  Finally she has a medication that will help her sleep through the night.  She just doesn't understand the problem and really needs a call back.

## 2021-04-27 NOTE — Telephone Encounter (Signed)
I called [place] back.  She states she took the Mirapex plus Seroquel 25 mg on the first night after seeing me, it did not keep her asleep so she got up and took another 25 mg pill of Seroquel.  Reports taking the Mirapex plus Seroquel 50 mg the next night and it did not work.  So from then on she has taken 100 mg of Seroquel every night and has now run out.  She has called here several times telling me to send in a prescription for her. Patient was told that first of all she needs to follow my instructions, do not increase the dose without me telling her to.  I reiterated the instructions for the Mirapex at our last visit, when she does not take it seems to cause insomnia.  She interrupts me several times and said "I only take it when my restless leg syndrome is acting up."  I understand that but it can be taken every night, she may be having some restlessness while she is asleep and she does not know it, therefore the Mirapex every night may be helpful. She kept repeating over and over that her daughter takes Seroquel 100 mg and it helps her sleep.  Rosina was told that it does not matter what her daughter takes that I do not want her to take Seroquel at all if possible because she already takes numerous medications for various things.  Plus she is on Risperdal which is in the same class of drugs as the Seroquel.  Her reply was that the Risperdal does not help with her sleep.  She was told it is not a sleeping pill so I would not expect it to.  I do not want her to be on 2 antipsychotics if not needed.  That increases the risk of movement disorders especially, but of course affects blood sugar, cholesterol, increased hunger, and weight gain.  So I have a reason that I am not going to call in higher dose of Seroquel. I want her to take the Mirapex 1 pill nightly for 4 nights and if that is not effective then call our office on Monday.  At that time, I will start to wean her off Risperdal and cross taper with  Seroquel.

## 2021-05-02 ENCOUNTER — Encounter: Payer: Self-pay | Admitting: Gastroenterology

## 2021-05-02 ENCOUNTER — Encounter: Payer: Self-pay | Admitting: Oncology

## 2021-05-02 ENCOUNTER — Other Ambulatory Visit: Payer: Self-pay

## 2021-05-02 ENCOUNTER — Ambulatory Visit (INDEPENDENT_AMBULATORY_CARE_PROVIDER_SITE_OTHER): Payer: BC Managed Care – PPO

## 2021-05-02 VITALS — BP 126/78 | HR 89 | Temp 97.6°F | Resp 16 | Ht 61.0 in | Wt 136.4 lb

## 2021-05-02 DIAGNOSIS — R519 Headache, unspecified: Secondary | ICD-10-CM | POA: Diagnosis not present

## 2021-05-02 DIAGNOSIS — K5100 Ulcerative (chronic) pancolitis without complications: Secondary | ICD-10-CM | POA: Diagnosis not present

## 2021-05-02 DIAGNOSIS — R200 Anesthesia of skin: Secondary | ICD-10-CM | POA: Diagnosis not present

## 2021-05-02 DIAGNOSIS — M21371 Foot drop, right foot: Secondary | ICD-10-CM | POA: Diagnosis not present

## 2021-05-02 DIAGNOSIS — G4440 Drug-induced headache, not elsewhere classified, not intractable: Secondary | ICD-10-CM | POA: Diagnosis not present

## 2021-05-02 MED ORDER — DIPHENHYDRAMINE HCL 50 MG/ML IJ SOLN: 50.0000 mg | Freq: Once | INTRAMUSCULAR | Status: DC | PRN

## 2021-05-02 MED ORDER — ALBUTEROL SULFATE HFA 108 (90 BASE) MCG/ACT IN AERS: 2.0000 | INHALATION_SPRAY | Freq: Once | RESPIRATORY_TRACT | Status: DC | PRN

## 2021-05-02 MED ORDER — VEDOLIZUMAB 300 MG IV SOLR
300.0000 mg | Freq: Once | INTRAVENOUS | Status: AC
  Administered 2021-05-02: 300 mg via INTRAVENOUS
  Filled 2021-05-02: qty 5

## 2021-05-02 MED ORDER — SODIUM CHLORIDE 0.9 % IV SOLN: Freq: Once | INTRAVENOUS | Status: DC | PRN

## 2021-05-02 MED ORDER — METHYLPREDNISOLONE SODIUM SUCC 125 MG IJ SOLR: 125.0000 mg | Freq: Once | INTRAMUSCULAR | Status: DC | PRN

## 2021-05-02 MED ORDER — FAMOTIDINE IN NACL 20-0.9 MG/50ML-% IV SOLN: 20.0000 mg | Freq: Once | INTRAVENOUS | Status: DC | PRN

## 2021-05-02 MED ORDER — EPINEPHRINE 0.3 MG/0.3ML IJ SOAJ: 0.3000 mg | Freq: Once | INTRAMUSCULAR | Status: DC | PRN

## 2021-05-02 NOTE — Progress Notes (Addendum)
Diagnosis: Indeterminate Colitis  Provider:  Marshell Garfinkel, MD  Procedure: Infusion  IV Type: Peripheral, IV Location: L Hand  Entyvio (Vedolizumab), Dose: 300 mg  Infusion Start Time: 4473  Infusion Stop Time: 1620  Post Infusion IV Care: Peripheral IV Discontinued  Discharge: Condition: Good, Destination: Home . AVS provided to patient.   Performed by:  Charlie Pitter, RN

## 2021-05-02 NOTE — Telephone Encounter (Signed)
Addendum to previous message on 04/27/21. Amy Hansen called and said that she took the Mirapex for four nights and no Seroquel for 2 days. Mirapex is not helping. She is asking for a higher dose of Seroquel. She just called on the 29th and had spoken to [place]. I told her what the message said again.  She seems to be really confused. Her phone number is [phone].

## 2021-05-04 ENCOUNTER — Other Ambulatory Visit: Payer: Self-pay | Admitting: Physician Assistant

## 2021-05-04 ENCOUNTER — Telehealth: Payer: Self-pay | Admitting: Physician Assistant

## 2021-05-04 MED ORDER — RISPERIDONE 2 MG PO TABS: 2.0000 mg | ORAL_TABLET | Freq: Every day | ORAL | 1 refills | Status: DC

## 2021-05-04 MED ORDER — QUETIAPINE FUMARATE 100 MG PO TABS: 100.0000 mg | ORAL_TABLET | Freq: Every day | ORAL | 1 refills | Status: DC

## 2021-05-04 NOTE — Telephone Encounter (Signed)
Pt advised that she was given instructions to try (she didn't specify what) over the weekend and call back Monday with the results.  She has called back and is waiting for someone to tell her the next course of action.  Pls call her back.

## 2021-05-04 NOTE — Telephone Encounter (Signed)
Per Carlos American for documentation: Please let her know that I sent in a prescription for Seroquel 100 mg, 1 p.o. nightly.  I also sent in a prescription for Risperdal 2 mg, 1 p.o. nightly.  We are doing that to wean off the Risperdal. Reminded her she needs to make an appointment with me within the next month to 6 weeks.  I see that she does not have an appointment on the books. Please make sure these messages go in the chart as notes like they always do.  I am not sure if they do when they are attached to a prescription like this. Also cancel the refills on Risperdal 3 mg at her pharmacy. Thank you.

## 2021-05-04 NOTE — Telephone Encounter (Signed)
Pt informed

## 2021-05-04 NOTE — Telephone Encounter (Signed)
Please review

## 2021-05-04 NOTE — Telephone Encounter (Signed)
Please let her know that I sent in a prescription for Seroquel 100 mg, 1 p.o. nightly.  I also sent in a prescription for Risperdal 2 mg, 1 p.o. nightly.  We are doing that to wean off the Risperdal. Reminded her she needs to make an appointment with me within the next month to 6 weeks.  I see that she does not have an appointment on the books. Please make sure these messages go in the chart as notes like they always do.  I am not sure if they do when they are attached to a prescription like this. Also cancel the refills on Risperdal 3 mg at her pharmacy. Thank you.

## 2021-05-12 NOTE — Telephone Encounter (Signed)
Yes I did I made a note on it so it could be documented

## 2021-05-12 NOTE — Telephone Encounter (Signed)
Amy Hansen, have you spoke with pt or do I need to contact her?

## 2021-05-14 DIAGNOSIS — M25561 Pain in right knee: Secondary | ICD-10-CM | POA: Diagnosis not present

## 2021-05-14 DIAGNOSIS — M13861 Other specified arthritis, right knee: Secondary | ICD-10-CM | POA: Diagnosis not present

## 2021-05-15 ENCOUNTER — Telehealth: Payer: Self-pay | Admitting: Physician Assistant
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
## 2021-05-15 NOTE — Telephone Encounter (Signed)
Yes Medrol dose pack is fine if she needs that. She should otherwise avoid NSAIDs. Thanks

## 2021-05-15 NOTE — Telephone Encounter (Signed)
Patient is advised.  
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
## 2021-05-19 ENCOUNTER — Ambulatory Visit (INDEPENDENT_AMBULATORY_CARE_PROVIDER_SITE_OTHER): Payer: BC Managed Care – PPO | Admitting: Gastroenterology

## 2021-05-19 ENCOUNTER — Other Ambulatory Visit (INDEPENDENT_AMBULATORY_CARE_PROVIDER_SITE_OTHER): Payer: BC Managed Care – PPO

## 2021-05-19 ENCOUNTER — Encounter: Payer: Self-pay | Admitting: Gastroenterology

## 2021-05-19 VITALS — BP 134/84 | HR 74 | Ht 61.0 in | Wt 142.0 lb

## 2021-05-19 DIAGNOSIS — K523 Indeterminate colitis: Secondary | ICD-10-CM | POA: Diagnosis not present

## 2021-05-19 DIAGNOSIS — R7989 Other specified abnormal findings of blood chemistry: Secondary | ICD-10-CM | POA: Diagnosis not present

## 2021-05-19 LAB — HEPATIC FUNCTION PANEL
ALT: 45 U/L — ABNORMAL HIGH (ref 0–35)
AST: 41 U/L — ABNORMAL HIGH (ref 0–37)
Albumin: 4 g/dL (ref 3.5–5.2)
Alkaline Phosphatase: 98 U/L (ref 39–117)
Bilirubin, Direct: 0 mg/dL (ref 0.0–0.3)
Total Bilirubin: 0.2 mg/dL (ref 0.2–1.2)
Total Protein: 6.9 g/dL (ref 6.0–8.3)

## 2021-05-19 NOTE — Progress Notes (Signed)
HPI :  65 year old female here for a follow-up visit for colitis.   Colitis History: She was admitted to the hospital in October 2021 for iron deficiency anemia and diarrhea/colitis.  She had an infectious work-up performed which was negative.  CT scan showed pancolitis at the time.  She underwent a colonoscopy with Dr. Ardis Hughs showing focal active nonspecific ileitis (on biopsies only - endoscopically ileum was normal), patchy nonspecific colitis in the right and left colon on biopsies, pancolonic inflammation grossly.  She was started on prednisone and then transitioned eventually to Lialda maintenance therapy.     She had a colonoscopy with me on March 2022 for surveillance.  She states about a week prior to that colonoscopy she started developing flare of symptoms. Colonoscopy showed a normal ileum, but with pancolitis that is active, worse in the left colon.  Biopsies showed chronic active colitis throughout.  She was hospitalized 7/13 through 02/10/2021 with acute exacerbation of diarrhea.  There was suspicion for C. difficile and she had been started on empiric vancomycin just prior to admission as well as prednisone.  GI path panel was negative and C. difficile quick screen also negative.  She did complete a course of vancomycin, and was discharged on prednisone 40 mg/day which has gradually been tapered. Eventually transitioned to [facility] once she got health care insurance.   Since the last visit: We last saw her in September at which time she was able to obtain insurance.  She recently started on Entyvio about 1 month ago, she has had 2 infusions so far and has her next infusion on October 26.  She has since been off all steroids for the past month.  She states she is feeling much better.  She states this is the best her bowels have felt since she has had a diagnosis of colitis.  She is currently having 2-3 formed bowel movements per day without blood.  No abdominal pains.  No blood in her  stools.  She has been using ibuprofen fairly frequently for daily knee pain, she was seen by orthopedics recently when her knee "locked up".  Of note she has had some elevated liver enzymes in July and again in September when checked.  AST is ranged in the 70s with ALT from 49-111, normal alk phos and bilirubin.During her hospitalization she had some elevated liver enzymes which was worked up with serologies.  AMA was mildly elevated and ANA was positive however smooth muscle AB negative as well as IgG.  With treatment of her colitis her liver enzymes have normalized.  She states the only new medication she has been on since this past summer has been Seroquel.  Entyvio only added over the past month.     Prior work-up  Colonoscopy 05/03/2020: - Mild to moderate inflammation from anus to cecum with a normal appearing terminal ileum. Multiple biopsies taken.   A. SMALL BOWEL, TERMINAL ILEUM, BIOPSY:  - Focally active nonspecific ileitis  - Negative for features of chronicity or granulomas   B. COLON, RIGHT, BIOPSY:  - Patchy active nonspecific colitis with focal ulceration  - Focal microscopic aggregate of epithelioid histiocytes suggestive of a  microadenoma  - See comment   C. COLON, LEFT, BIOPSY:  - Patchy mildly active nonspecific colitis  - Negative for features of chronicity or granulomas  COMMENT:   A, B and C.   Taken together, the ileocolonic biopsies show patchy  active colitis with microgranulomas within the right colon.  Diagnostic  histologic  features of idiopathic inflammatory bowel disease, including  lamina propria and basal lymphoplasmacytosis or crypt architectural  distortion are not present.  Differential diagnosis can include  infection, drug effect and evolving/early inflammatory bowel disease  (e.g. Crohn's disease).  AFB and GMS special stains are negative for  acid-fast bacilli and fungal organisms respectively.  Clinical-radiologic correlation and patient  follow-up is suggested.      CTAP with contrast 04/21/2020: 1. Diffuse circumferential wall thickening of virtually all of the colon, most notably at the level of the cecum and ascending colon, consistent with infectious or inflammatory colitis. 2. There is a punctate focus of gas within the urinary bladder. Correlate for history of recent instrumentation.  Aortic Atherosclerosis      Colonoscopy 10/06/20 - The perianal and digital rectal examinations were normal. - The terminal ileum appeared normal. - Patchy inflammation characterized by erosions, granularity, mucus and aphthous ulcerations was found in the entire colon, most prominent in the left colon. Biopsies were taken with a cold forceps for histology. - Scattered medium-mouthed diverticula were found in the entire colon. - The exam was otherwise without abnormality.   1. Surgical [P], random right sided sites - [place], DYSPLASIA OR MALIGNANCY IDENTIFIED 2. Surgical [P], colon, transverse - CHRONIC ACTIVE COLITIS - NO GRANULOMATA, DYSPLASIA OR MALIGNANCY IDENTIFIED 3. Surgical [P], random left sided sites - CHRONIC ACTIVE COLITIS - NO GRANULOMATA, DYSPLASIA OR MALIGNANCY IDENTIFIED    Past Medical History:  Diagnosis Date   Anemia    Anxiety    Arthritis    Chronic kidney disease    UTI, hematuria in urine   Colitis    Crohn's disease ([place])    Depression    Diabetes ([place])    Diverticulosis    Frequent headaches    Interstitial cystitis    Recurrent UTI    Restless leg syndrome    Urinary frequency      Past Surgical History:  Procedure Laterality Date   ABDOMINAL HYSTERECTOMY     bariatric bypass  2012   BIOPSY  05/03/2020   Procedure: BIOPSY;  Surgeon: Milus Banister, MD;  Location[facility] ENDOSCOPY;  Service: Endoscopy;;   CARPAL TUNNEL RELEASE Right 2003   CARPAL TUNNEL RELEASE Right    2008   CHOLECYSTECTOMY  1975   COLONOSCOPY WITH PROPOFOL N/A 05/03/2020    Procedure: COLONOSCOPY WITH PROPOFOL;  Surgeon: Milus Banister, MD;  Location: [facility] ENDOSCOPY;  Service: Endoscopy;  Laterality: N/A;   CYSTOSCOPY W/ RETROGRADES Bilateral 06/06/2015   Procedure: CYSTOSCOPY WITH RETROGRADE PYELOGRAM;  Surgeon: Festus Aloe, MD;  Location: [facility] ORS;  Service: Urology;  Laterality: Bilateral;   FL INJ LEFT KNEE CT ARTHROGRAM ([facility] HX) Left    1995   GASTRIC BYPASS  2010   HAND SURGERY Left 01/19/2021   Thumb   HEMORRHOID SURGERY  2013   KNEE ARTHROSCOPY Left 1996   TONSILLECTOMY     Family History  Problem Relation Age of Onset   Stroke Father    Colon cancer Mother    Heart failure Sister    Bladder Cancer Neg Hx    Kidney disease Neg Hx    Prostate cancer Neg Hx    Kidney cancer Neg Hx    Pancreatic cancer Neg Hx    Esophageal cancer Neg Hx    Stomach cancer Neg Hx    Rectal cancer Neg Hx    Social History   Tobacco Use   Smoking status: Former  Packs/day: 0.50    Types: Cigarettes    Quit date: 04/25/1975    Years since quitting: 46.0   Smokeless tobacco: Never   Tobacco comments:    quit 40 years  Vaping Use   Vaping Use: Never used  Substance Use Topics   Alcohol use: No    Alcohol/week: 0.0 standard drinks   Drug use: No   Current Outpatient Medications  Medication Sig Dispense Refill   ALPRAZolam (XANAX) 1 MG tablet Take 1 tablet (1 mg total) by mouth 3 (three) times daily as needed for anxiety. 90 tablet 5   buPROPion (WELLBUTRIN XL) 150 MG 24 hr tablet Take 3 tablets by mouth once daily 270 tablet 3   Cholecalciferol (VITAMIN D) 50 MCG (2000 UT) CAPS Take 1 capsule (2,000 Units total) by mouth daily. 30 capsule    dicyclomine (BENTYL) 10 MG capsule Take 1 capsule (10 mg total) by mouth 3 (three) times daily as needed for spasms. 90 capsule 4   furosemide (LASIX) 20 MG tablet Take 1 tablet (79m) daily. Take an additional tablet (238m AS NEEDED. 90 tablet 0   gabapentin (NEURONTIN) 100 MG capsule Take 1 mg by mouth as  needed.     glipiZIDE (GLUCOTROL) 5 MG tablet Take 1 tablet by mouth twice daily. 180 tablet 1   insulin glargine, 2 Unit Dial, (TOUJEO MAX SOLOSTAR) 300 UNIT/ML Solostar Pen Inject 24 units daily. Titrate as instructed. Max daily dose 40 units 12 mL 1   Insulin Pen Needle (PEN NEEDLES) 32G X 4 MM MISC 1 Units by Does not apply route at bedtime. 100 each 1   lamoTRIgine (LAMICTAL) 200 MG tablet Take 2 tablets (400 mg total) by mouth at bedtime. 60 tablet 11   metFORMIN (GLUCOPHAGE) 1000 MG tablet Take 1 tablet by mouth twice daily. 180 tablet 1   ondansetron (ZOFRAN) 4 MG tablet Take 1 tablet (4 mg total) by mouth daily as needed for nausea or vomiting. 30 tablet 1   pramipexole (MIRAPEX) 1 MG tablet Take 1 tablet (1 mg total) by mouth at bedtime. 90 tablet 0   QUEtiapine (SEROQUEL) 100 MG tablet Take 1 tablet (100 mg total) by mouth at bedtime. 30 tablet 1   QUEtiapine (SEROQUEL) 25 MG tablet Take 1-2 tablets (25-50 mg total) by mouth at bedtime. As needed for sleep 20 tablet 1   risperiDONE (RISPERDAL) 2 MG tablet Take 1 tablet (2 mg total) by mouth at bedtime. 30 tablet 1   sertraline (ZOLOFT) 100 MG tablet Take 2 tablets (200 mg total) by mouth daily. 60 tablet 11   No current facility-administered medications for this visit.   Allergies  Allergen Reactions   Avelox [Moxifloxacin Hcl In Nacl] Anaphylaxis   Bactrim [Sulfamethoxazole-Trimethoprim] Anaphylaxis   Ciprofloxacin Other (See Comments)    Pt states she was told never to take this as it is in the same family as Avelox.    Depakote [Divalproex Sodium] Other (See Comments)    Unknown Reaction   Imitrex [Sumatriptan] Other (See Comments)    Neck and shoulder pain   Stadol [Butorphanol] Rash     Review of Systems: All systems reviewed and negative except where noted in HPI.    ECHOCARDIOGRAM COMPLETE  Result Date: 04/20/2021    ECHOCARDIOGRAM REPORT   Patient Name:   WAKHRYSTYNA SCHWALMate of Exam: 04/20/2021 Medical Rec #:   [id number]    Height:       61.0 in Accession #:  [id number]     Weight:       136.2 lb Date of Birth:  1956-05-28      BSA:          1.604 m Patient Age:    6 years       BP:           115/62 mmHg Patient Gender: F              HR:           93 bpm. Exam Location:  [place] Procedure: 2D Echo, Cardiac Doppler, Color Doppler and Strain Analysis Indications:    R60.0 Lower extremity edema; O12.24 Chronic diastolic                 (congestive) heart failure  History:        Patient has prior history of Echocardiogram examinations, most                 recent 08/23/2016. CHF, TIA, Signs/Symptoms:Edema; Risk                 Factors:Hypertension and Diabetes.  Sonographer:    Caesar Chestnut RDCS, RVT Referring Phys: [id number] Loel Dubonnet  Sonographer Comments: Global longitudinal strain was attempted. IMPRESSIONS  1. Left ventricular ejection fraction, by estimation, is 60 to 65%. Left ventricular ejection fraction by 2D MOD biplane is 64.3 %. The left ventricle has normal function. The left ventricle has no regional wall motion abnormalities. Left ventricular diastolic parameters are consistent with Grade I diastolic dysfunction (impaired relaxation).  2. Right ventricular systolic function is normal. The right ventricular size is normal.  3. The mitral valve is grossly normal. No evidence of mitral valve regurgitation.  4. The aortic valve is tricuspid. Aortic valve regurgitation is not visualized. Mild aortic valve sclerosis is present, with no evidence of aortic valve stenosis.  5. The inferior vena cava is normal in size with greater than 50% respiratory variability, suggesting right atrial pressure of 3 mmHg. Comparison(s): LVEF 55-60%. FINDINGS  Left Ventricle: Left ventricular ejection fraction, by estimation, is 60 to 65%. Left ventricular ejection fraction by 2D MOD biplane is 64.3 %. The left ventricle has normal function. The left ventricle has no regional wall motion abnormalities. Global   longitudinal strain performed but not reported based on interpreter judgement due to suboptimal tracking. The left ventricular internal cavity size was normal in size. There is no left ventricular hypertrophy. Left ventricular diastolic parameters are consistent with Grade I diastolic dysfunction (impaired relaxation). Right Ventricle: The right ventricular size is normal. No increase in right ventricular wall thickness. Right ventricular systolic function is normal. Left Atrium: Left atrial size was normal in size. Right Atrium: Right atrial size was normal in size. Pericardium: There is no evidence of pericardial effusion. Mitral Valve: The mitral valve is grossly normal. Mild mitral annular calcification. No evidence of mitral valve regurgitation. Tricuspid Valve: The tricuspid valve is normal in structure. Tricuspid valve regurgitation is not demonstrated. Aortic Valve: The aortic valve is tricuspid. Aortic valve regurgitation is not visualized. Mild aortic valve sclerosis is present, with no evidence of aortic valve stenosis. Aortic valve mean gradient measures 5.0 mmHg. Aortic valve peak gradient measures 8.5 mmHg. Aortic valve area, by VTI measures 2.49 cm. Pulmonic Valve: The pulmonic valve was normal in structure. Pulmonic valve regurgitation is not visualized. Aorta: The aortic root is normal in size and structure. Venous: The inferior vena cava is normal in size with greater than 50%  respiratory variability, suggesting right atrial pressure of 3 mmHg. IAS/Shunts: No atrial level shunt detected by color flow Doppler.  LEFT VENTRICLE PLAX 2D                        Biplane EF (MOD) LVIDd:         3.40 cm         LV Biplane EF:   Left LVIDs:         2.10 cm                          ventricular LV PW:         1.00 cm                          ejection LV IVS:        1.00 cm                          fraction by LVOT diam:     2.00 cm                          2D MOD LV SV:         74                                biplane is LV SV Index:   46                               64.3 %. LVOT Area:     3.14 cm                                Diastology                                LV e' medial:    5.87 cm/s LV Volumes (MOD)               LV E/e' medial:  9.0 LV vol d, MOD    94.2 ml       LV e' lateral:   6.74 cm/s A2C:                           LV E/e' lateral: 7.9 LV vol d, MOD    85.7 ml A4C: LV vol s, MOD    26.3 ml A2C: LV vol s, MOD    38.3 ml A4C: LV SV MOD A2C:   67.9 ml LV SV MOD A4C:   85.7 ml LV SV MOD BP:    57.6 ml RIGHT VENTRICLE             IVC RV Basal diam:  3.70 cm     IVC diam: 1.00 cm RV Mid diam:    3.10 cm RV S prime:     10.90 cm/s TAPSE (M-mode): 2.1 cm LEFT ATRIUM             Index       RIGHT ATRIUM           Index LA Vol (  A2C):   36.9 ml 23.00 ml/m RA Area:     11.90 cm LA Vol (A4C):   35.6 ml 22.19 ml/m RA Volume:   25.40 ml  15.83 ml/m LA Biplane Vol: 36.5 ml 22.75 ml/m  AORTIC VALVE                    PULMONIC VALVE AV Area (Vmax):    2.71 cm     PV Vmax:          0.89 m/s AV Area (Vmean):   2.73 cm     PV Peak grad:     3.1 mmHg AV Area (VTI):     2.49 cm     PR End Diast Vel: 4.16 msec AV Vmax:           146.00 cm/s AV Vmean:          103.000 cm/s AV VTI:            0.297 m AV Peak Grad:      8.5 mmHg AV Mean Grad:      5.0 mmHg LVOT Vmax:         126.00 cm/s LVOT Vmean:        89.400 cm/s LVOT VTI:          0.235 m LVOT/AV VTI ratio: 0.79  AORTA Ao Root diam: 2.60 cm Ao Asc diam:  2.60 cm Ao Arch diam: 2.6 cm MITRAL VALVE               TRICUSPID VALVE MV Area (PHT): 3.93 cm    TR Peak grad:   6.6 mmHg MV Decel Time: 193 msec    TR Vmax:        128.00 cm/s MV E velocity: 53.10 cm/s MV A velocity: 78.40 cm/s  SHUNTS MV E/A ratio:  0.68        Systemic VTI:  0.24 m                            Systemic Diam: 2.00 cm Kate Sable MD Electronically signed by Kate Sable MD Signature Date/Time: 04/20/2021/2:21:02 PM    Final     Physical Exam: BP 134/84   Pulse 74   Ht 5' 1"  (1.549  m)   Wt 142 lb (64.4 kg)   LMP  (LMP Unknown)   SpO2 97%   BMI 26.83 kg/m  Constitutional: Pleasant,well-developed, female in no acute distress. Neurological: Alert and oriented to person place and time. Psychiatric: Normal mood and affect. Behavior is normal.   ASSESSMENT AND PLAN: 65 year old female here for reassessment following:  Indeterminate colitis - favor Crohn's colitis based on endoscopic appearance in the past Elevated LFTs  Patient failed mesalamine monotherapy with flare that led to hospitalization and active colitis on colonoscopy.  Her course of been complicated by severe iron deficiency anemia in the past.  We have been trying to get her on biologic therapy for some months but was finally able to start this last month when she got healthcare insurance.  She is currently on Entyvio and feeling really well after just 2 doses.  She will continue her induction dosing and then continue with maintenance every 8 weeks.  She is off all steroids.  Moving forward we will continue this regimen, we will plan for surveillance colonoscopy in 6 to 9 months or so to assess for mucosal healing.  She has been using NSAIDs routinely for aches and  pains lately, I recommend she completely avoid NSAIDs and use Tylenol as needed for joint pains as needed.  Otherwise her liver functions had normalized from prior elevation last year, however recurrent mild elevation recently.  We will recheck her LFTs today and if persistent elevation will consider reimaging of her liver and/or liver biopsy if this persists as prior serologic work-up was unremarkable.  Seroquel can cause transient elevations in transaminases and this was the latest medication that was added to her regimen.  Plan: - continue Entyvio - stop ibuprofen and NSAIDs - Tylenol PRN for achs / pains - LFTs today - further workup if persistent - f/u 4 months in office - surveillance colonoscopy in 6 months or so to assess response to  [facility].  Jolly Mango, MD [facility]

## 2021-05-19 NOTE — Patient Instructions (Addendum)
If you are age 64 or older, your body mass index should be between 23-30. Your Body mass index is 26.83 kg/m. If this is out of the aforementioned range listed, please consider follow up with your Primary Care Provider.  If you are age 6 or younger, your body mass index should be between 19-25. Your Body mass index is 26.83 kg/m. If this is out of the aformentioned range listed, please consider follow up with your Primary Care Provider.   ________________________________________________________  The [facility] providers would like to encourage you to use [facility] to communicate with providers for non-urgent requests or questions.  Due to long hold times on the telephone, sending your provider a message by [facility] may be a faster and more efficient way to get a response.  Please allow 48 business hours for a response.  Please remember that this is for non-urgent requests.  _______________________________________________________  Amy Hansen.  Please go to the lab in the basement of our building to have lab work done as you leave today. Hit "B" for basement when you get on the elevator.  When the doors open the lab is on your left.  We will call you with the results. Thank you.  Stop all NSAIDs (Ibuprofen).  Use Tylenol for pain as needed.  Thank you for entrusting me with your care and for choosing [facility], Dr. Maui Cellar

## 2021-05-20 ENCOUNTER — Other Ambulatory Visit: Payer: Self-pay | Admitting: Physician Assistant

## 2021-05-22 ENCOUNTER — Other Ambulatory Visit: Payer: Self-pay

## 2021-05-22 DIAGNOSIS — R7989 Other specified abnormal findings of blood chemistry: Secondary | ICD-10-CM

## 2021-05-23 ENCOUNTER — Ambulatory Visit: Payer: BC Managed Care – PPO

## 2021-05-23 DIAGNOSIS — L03032 Cellulitis of left toe: Secondary | ICD-10-CM | POA: Diagnosis not present

## 2021-05-24 ENCOUNTER — Ambulatory Visit: Payer: BC Managed Care – PPO

## 2021-05-24 ENCOUNTER — Other Ambulatory Visit: Payer: Self-pay

## 2021-05-24 ENCOUNTER — Ambulatory Visit: Payer: BC Managed Care – PPO | Admitting: Nurse Practitioner

## 2021-05-24 ENCOUNTER — Encounter: Payer: Self-pay | Admitting: Nurse Practitioner

## 2021-05-24 DIAGNOSIS — E1165 Type 2 diabetes mellitus with hyperglycemia: Secondary | ICD-10-CM | POA: Diagnosis not present

## 2021-05-24 DIAGNOSIS — K5100 Ulcerative (chronic) pancolitis without complications: Secondary | ICD-10-CM

## 2021-05-24 MED ORDER — TRULICITY 0.75 MG/0.5ML ~~LOC~~ SOAJ: 0.7500 mg | SUBCUTANEOUS | 0 refills | Status: DC

## 2021-05-24 NOTE — Assessment & Plan Note (Signed)
Chronic.  Not well controlled.  Improved since stopping the steroids.  Patient is no longer on Prednisone, she is not checking her sugars and stopped the Toujeo.  Will start Trulicity 3.50VD weekly for kidney protection and A1c control.  Recommend patient check blood sugars fasting at home.  Follow up in 2 months for reevaluation of A1c.

## 2021-05-24 NOTE — Progress Notes (Signed)
Patient presented today for Entyvio Infusion. Peripheral IV was started in right [facility] with no problems and vitals obtained. After reviewing last treatment we noticed that this appointment was a week too soon as last treatment was on 05/02/21 and due 4 weeks later.  We discontinued IV and rescheduled patient. No infusion was completed. Informed practice manager Jena Gauss, RN.

## 2021-05-24 NOTE — Progress Notes (Signed)
BP 132/86   Pulse 88   Ht 5' 1"  (1.549 m)   Wt 140 lb 3.2 oz (63.6 kg)   LMP  (LMP Unknown)   SpO2 98%   BMI 26.49 kg/m    Subjective:    Patient ID: Amy Hansen, female    DOB: November 12, 1955, 65 y.o.   MRN: [id number]  HPI: Amy Hansen is a 65 y.o. female  Chief Complaint  Patient presents with   Diabetes   DIABETES Patient states she has had several episodes where she has woken up and her sugars are below 60. Hypoglycemic episodes: no Polydipsia/polyuria: no Visual disturbance: no Chest pain: no Paresthesias yes Glucose Monitoring: No  Accucheck frequency: Not checking  Fasting glucose:   Post prandial:  Evening:  Before meals: Taking Insulin?: no  Long acting insulin:   Short acting insulin: Blood Pressure Monitoring: not checking  CHRON'S  Patient states her infusion of entivio for her chron's disease.  She will get her 3rd infusion today.  She can already tell a big difference in her symptoms since starting the medication.   Relevant past medical, surgical, family and social history reviewed and updated as indicated. Interim medical history since our last visit reviewed. Allergies and medications reviewed and updated.  Review of Systems  Eyes:  Negative for visual disturbance.  Cardiovascular:  Negative for chest pain.  Endocrine: Negative for polydipsia and polyuria.  Neurological:  Positive for numbness. Negative for dizziness and light-headedness.   Per HPI unless specifically indicated above     Objective:    BP 132/86   Pulse 88   Ht 5' 1"  (1.549 m)   Wt 140 lb 3.2 oz (63.6 kg)   LMP  (LMP Unknown)   SpO2 98%   BMI 26.49 kg/m   Wt Readings from Last 3 Encounters:  05/24/21 140 lb 3.2 oz (63.6 kg)  05/19/21 142 lb (64.4 kg)  05/02/21 136 lb 6.4 oz (61.9 kg)    Physical Exam Vitals and nursing note reviewed.  Constitutional:      General: She is not in acute distress.    Appearance: Normal appearance. She is normal weight. She is  not ill-appearing, toxic-appearing or diaphoretic.  HENT:     Head: Normocephalic.     Right Ear: External ear normal.     Left Ear: External ear normal.     Nose: Nose normal.     Mouth/Throat:     Mouth: Mucous membranes are moist.     Pharynx: Oropharynx is clear.  Eyes:     General:        Right eye: No discharge.        Left eye: No discharge.     Extraocular Movements: Extraocular movements intact.     Conjunctiva/sclera: Conjunctivae normal.     Pupils: Pupils are equal, round, and reactive to light.  Cardiovascular:     Rate and Rhythm: Normal rate and regular rhythm.     Heart sounds: No murmur heard. Pulmonary:     Effort: Pulmonary effort is normal. No respiratory distress.     Breath sounds: Normal breath sounds. No wheezing or rales.  Musculoskeletal:     Cervical back: Normal range of motion and neck supple.  Skin:    General: Skin is warm and dry.     Capillary Refill: Capillary refill takes less than 2 seconds.  Neurological:     General: No focal deficit present.     Mental Status: She is alert  and oriented to person, place, and time. Mental status is at baseline.  Psychiatric:        Mood and Affect: Mood normal.        Behavior: Behavior normal.        Thought Content: Thought content normal.        Judgment: Judgment normal.    Results for orders placed or performed in visit on 05/19/21  Hepatic function panel  Result Value Ref Range   Total Bilirubin 0.2 0.2 - 1.2 mg/dL   Bilirubin, Direct 0.0 0.0 - 0.3 mg/dL   Alkaline Phosphatase 98 39 - 117 U/L   AST 41 (H) 0 - 37 U/L   ALT 45 (H) 0 - 35 U/L   Total Protein 6.9 6.0 - 8.3 g/dL   Albumin 4.0 3.5 - 5.2 g/dL      Assessment & Plan:   Problem List Items Addressed This Visit       Endocrine   Poorly controlled type 2 diabetes mellitus (HCC)    Chronic.  Not well controlled.  Improved since stopping the steroids.  Patient is no longer on Prednisone, she is not checking her sugars and stopped  the Toujeo.  Will start Trulicity 4.54UJ weekly for kidney protection and A1c control.  Recommend patient check blood sugars fasting at home.  Follow up in 2 months for reevaluation of A1c.        Relevant Medications   Dulaglutide (TRULICITY) 8.11 BJ/4.7WG SOPN     Follow up plan: Return in about 2 months (around 07/24/2021) for HTN, HLD, DM2 FU.

## 2021-05-24 NOTE — Assessment & Plan Note (Signed)
>>  ASSESSMENT AND PLAN FOR DIABETES MELLITUS TYPE 2 IN NONOBESE (HCC) WRITTEN ON 05/24/2021  9:37 AM BY HOLDSWORTH, KAREN, NP  Chronic.  Not well controlled.  Improved since stopping the steroids.  Patient is no longer on Prednisone, she is not checking her sugars and stopped the Toujeo.  Will start Trulicity 0.75mg  weekly for kidney protection and A1c control.  Recommend patient check blood sugars fasting at home.  Follow up in 2 months for reevaluation of A1c.

## 2021-05-26 ENCOUNTER — Telehealth: Payer: Self-pay

## 2021-05-26 NOTE — Telephone Encounter (Signed)
PA for Trulicity initiated and approved via Cover My Meds. Key: [id number]

## 2021-05-29 DIAGNOSIS — M1711 Unilateral primary osteoarthritis, right knee: Secondary | ICD-10-CM | POA: Diagnosis not present

## 2021-06-01 ENCOUNTER — Ambulatory Visit (INDEPENDENT_AMBULATORY_CARE_PROVIDER_SITE_OTHER): Payer: BC Managed Care – PPO

## 2021-06-01 ENCOUNTER — Other Ambulatory Visit: Payer: Self-pay

## 2021-06-01 VITALS — BP 125/78 | HR 76 | Temp 98.2°F | Ht 61.0 in | Wt 139.0 lb

## 2021-06-01 DIAGNOSIS — K5100 Ulcerative (chronic) pancolitis without complications: Secondary | ICD-10-CM

## 2021-06-01 MED ORDER — DIPHENHYDRAMINE HCL 50 MG/ML IJ SOLN: 50.0000 mg | Freq: Once | INTRAMUSCULAR | Status: DC | PRN

## 2021-06-01 MED ORDER — EPINEPHRINE 0.3 MG/0.3ML IJ SOAJ: 0.3000 mg | Freq: Once | INTRAMUSCULAR | Status: DC | PRN

## 2021-06-01 MED ORDER — ALBUTEROL SULFATE HFA 108 (90 BASE) MCG/ACT IN AERS: 2.0000 | INHALATION_SPRAY | Freq: Once | RESPIRATORY_TRACT | Status: DC | PRN

## 2021-06-01 MED ORDER — FAMOTIDINE IN NACL 20-0.9 MG/50ML-% IV SOLN: 20.0000 mg | Freq: Once | INTRAVENOUS | Status: DC | PRN

## 2021-06-01 MED ORDER — METHYLPREDNISOLONE SODIUM SUCC 125 MG IJ SOLR: 125.0000 mg | Freq: Once | INTRAMUSCULAR | Status: DC | PRN

## 2021-06-01 MED ORDER — SODIUM CHLORIDE 0.9 % IV SOLN: Freq: Once | INTRAVENOUS | Status: DC | PRN

## 2021-06-01 MED ORDER — VEDOLIZUMAB 300 MG IV SOLR
300.0000 mg | Freq: Once | INTRAVENOUS | Status: AC
  Administered 2021-06-01: 300 mg via INTRAVENOUS
  Filled 2021-06-01: qty 5

## 2021-06-01 NOTE — Progress Notes (Signed)
Diagnosis: Crohn's Disease  Provider:  Marshell Garfinkel, MD  Procedure: Infusion  IV Type: Peripheral, IV Location: R Antecubital  Entyvio (Vedolizumab), Dose: 300 mg  Infusion Start Time: 3709  Infusion Stop Time: 1216  Post Infusion IV Care: Peripheral IV Discontinued  Discharge: Condition: Good, Destination: Home . AVS provided to patient.   Performed by:  Liley Rake, Sherlon Handing, LPN

## 2021-06-03 DIAGNOSIS — R300 Dysuria: Secondary | ICD-10-CM | POA: Diagnosis not present

## 2021-06-03 DIAGNOSIS — N390 Urinary tract infection, site not specified: Secondary | ICD-10-CM | POA: Diagnosis not present

## 2021-06-06 NOTE — Progress Notes (Signed)
Follow-up Outpatient Visit Date: 06/07/2021  Primary Care Provider: [name], NP [place] [address]  Chief Complaint: Follow-up HFpEF  HPI:  Amy Hansen is a 65 y.o. female with history of chronic HFpEF, diabetes mellitus, anemia, anxiety, depression, and restless leg syndrome, who presents for follow-up of HFpEF and atypical chest pain.  She was last seen in our office in May by Laurann Montana, NP, for evaluation of lower extremity edema.  This was preceded by a course of prednisone for colitis flare.  Furosemide was increased to 40 mg daily for 2 weeks while on higher dose of prednisone.  Compression stockings were also recommended.  Repeat echo in September showed normal LVEF with grade 1 diastolic dysfunction.  RV size and function were also normal.  There was no significant valvular abnormality.  Today, Amy Hansen reports that she has been feeling fairly well.  Her leg edema has resolved with discontinuation of prednisone as well as daily use of furosemide 20 mg.  She has not had any chest pain, shortness of breath, palpitations, or lightheadedness.  Her mobility is somewhat limited by knee pain.  She is scheduled to begin physical therapy and received knee joint injections.  She believes she may ultimately require surgery.  She attributes elevated heart rate today to rushing to get to today's appointment.  --------------------------------------------------------------------------------------------------  Cardiovascular History & Procedures: Cardiovascular Problems: Chronic diastolic heart failure Atypical chest pain   Risk Factors: Diabetes mellitus   Cath/PCI: None   CV Surgery: None   EP Procedures and Devices: Event monitor (08/10/2019): Patient was monitored for 12 days, 14 hours.  Predominantly sinus rhythm with rare PACs and PVCs as well as brief PSVT and NSVT.  Patient triggered events correspond to sinus rhythm with PACs.   Non-Invasive  Evaluation(s): TTE (04/20/2021): Normal LV size and wall thickness.  LVEF 60-65% with normal wall motion.  Grade 1 diastolic dysfunction.  Normal RV size and function.  Normal biatrial size.  Mild mitral annular calcification noted.  Aortic sclerosis without stenosis present.  Normal CVP. Bilateral lower extremity venous duplex (08/12/2019): No evidence of DVT in either leg. Exercise MPI (08/30/16): Low risk study without significant ischemia or scar. LVEF 50%. TTE (08/23/16): Normal LV size and wall thickness. LVEF 55-60% with normal wall motion. Grade 1 diastolic dysfunction. Mild MR. Mild left atrial enlargement. Normal RV size and function.  Recent CV Pertinent Labs: Lab Results  Component Value Date   CHOL 58 04/26/2020   CHOL 139 07/17/2016   CHOL 148 01/10/2012   HDL 29 (L) 04/26/2020   HDL 47 01/10/2012   LDLCALC 16 04/26/2020   LDLCALC 79 01/10/2012   TRIG 63 04/26/2020   TRIG 99 07/17/2016   TRIG 110 01/10/2012   CHOLHDL 2.0 04/26/2020   INR 0.9 04/25/2020   BNP 60.6 12/27/2020   K 4.6 04/19/2021   K 4.1 01/10/2012   MG 1.7 04/24/2020   BUN 10 04/19/2021   BUN 4 (L) 01/10/2012   CREATININE 0.66 04/19/2021   CREATININE 0.58 (L) 01/10/2012    Past medical and surgical history were reviewed and updated in EPIC.  Current Meds  Medication Sig   ALPRAZolam (XANAX) 1 MG tablet Take 1 tablet (1 mg total) by mouth 3 (three) times daily as needed for anxiety.   buPROPion (WELLBUTRIN XL) 150 MG 24 hr tablet Take 3 tablets by mouth once daily   dicyclomine (BENTYL) 10 MG capsule Take 1 capsule (10 mg total) by mouth 3 (  three) times daily as needed for spasms.   doxycycline (VIBRAMYCIN) 100 MG capsule Take 100 mg by mouth 2 (two) times daily.   furosemide (LASIX) 20 MG tablet Take 1 tablet (23m) daily. Take an additional tablet (277m AS NEEDED.   gabapentin (NEURONTIN) 100 MG capsule Take 100 mg by mouth as needed.   glipiZIDE (GLUCOTROL) 5 MG tablet Take 1 tablet by mouth twice  daily.   lamoTRIgine (LAMICTAL) 200 MG tablet Take 2 tablets (400 mg total) by mouth at bedtime.   metFORMIN (GLUCOPHAGE) 1000 MG tablet Take 1 tablet by mouth twice daily.   ondansetron (ZOFRAN) 4 MG tablet Take 1 tablet (4 mg total) by mouth daily as needed for nausea or vomiting.   pramipexole (MIRAPEX) 1 MG tablet Take 1 tablet (1 mg total) by mouth at bedtime.   QUEtiapine (SEROQUEL) 100 MG tablet Take 1 tablet (100 mg total) by mouth at bedtime.   risperiDONE (RISPERDAL) 2 MG tablet Take 1 tablet (2 mg total) by mouth at bedtime.   sertraline (ZOLOFT) 100 MG tablet Take 2 tablets (200 mg total) by mouth daily.   vedolizumab (ENTYVIO) 300 MG injection Inject into the vein every 8 (eight) weeks.    Allergies: Avelox [moxifloxacin hcl in nacl], Bactrim [sulfamethoxazole-trimethoprim], Ciprofloxacin, Depakote [divalproex sodium], Imitrex [sumatriptan], and Stadol [butorphanol]  Social History   Tobacco Use   Smoking status: Former    Packs/day: 0.50    Types: Cigarettes    Quit date: 04/25/1975    Years since quitting: 46.1   Smokeless tobacco: Never   Tobacco comments:    quit 40 years  Vaping Use   Vaping Use: Never used  Substance Use Topics   Alcohol use: No    Alcohol/week: 0.0 standard drinks   Drug use: No    Family History  Problem Relation Age of Onset   Stroke Father    Colon cancer Mother    Heart failure Sister    Bladder Cancer Neg Hx    Kidney disease Neg Hx    Prostate cancer Neg Hx    Kidney cancer Neg Hx    Pancreatic cancer Neg Hx    Esophageal cancer Neg Hx    Stomach cancer Neg Hx    Rectal cancer Neg Hx     Review of Systems: A 12-system review of systems was performed and was negative except as noted in the HPI.  --------------------------------------------------------------------------------------------------  Physical Exam: BP 120/86 (BP Location: Left Arm, Patient Position: Sitting, Cuff Size: Normal)   Pulse (!) 101   Ht 5' 1"  (1.2.542m)   Wt 139 lb (63 kg)   LMP  (LMP Unknown)   SpO2 93%   BMI 26.26 kg/m   General:  NAD. Neck: No JVD or HJR. Lungs: Clear to auscultation bilaterally without wheezes or crackles. Heart: Tachycardic but regular without murmurs, rubs, or gallops. Abdomen: Soft, nontender, nondistended. Extremities: No lower extremity edema.  EKG: Sinus tachycardia with borderline LVH.  Heart rate has increased since 12/27/2020.  Otherwise, no significant interval change.  Lab Results  Component Value Date   WBC 9.5 03/30/2021   HGB 11.2 (L) 03/30/2021   HCT 35.1 (L) 03/30/2021   MCV 86.4 03/30/2021   PLT 410.0 (H) 03/30/2021    Lab Results  Component Value Date   NA 137 04/19/2021   K 4.6 04/19/2021   CL 100 04/19/2021   CO2 21 04/19/2021   BUN 10 04/19/2021   CREATININE 0.66 04/19/2021   GLUCOSE 164 (H)  04/19/2021   ALT 45 (H) 05/19/2021    Lab Results  Component Value Date   CHOL 58 04/26/2020   HDL 29 (L) 04/26/2020   LDLCALC 16 04/26/2020   TRIG 63 04/26/2020   CHOLHDL 2.0 04/26/2020    --------------------------------------------------------------------------------------------------  ASSESSMENT AND PLAN: Chronic HFpEF: Amy Hansen appears euvolemic with resolution of leg edema after discontinuation of prednisone.  We will continue furosemide 20 mg daily with additional 20 mg dose later in the day as needed for weight gain/edema.  Continued sodium restriction encouraged.  Type 2 diabetes mellitus: Hemoglobin A1c above goal on last check, potentially exacerbated by corticosteroid use earlier this year.  Ongoing management per PCP encouraged.  Follow-up: Return to clinic in 1 year.  Nelva Bush, MD 06/07/2021 11:12 AM

## 2021-06-07 ENCOUNTER — Encounter: Payer: Self-pay | Admitting: Internal Medicine

## 2021-06-07 ENCOUNTER — Ambulatory Visit: Payer: BC Managed Care – PPO | Admitting: Internal Medicine

## 2021-06-07 ENCOUNTER — Other Ambulatory Visit: Payer: Self-pay

## 2021-06-07 VITALS — BP 120/86 | HR 101 | Ht 61.0 in | Wt 139.0 lb

## 2021-06-07 DIAGNOSIS — I5032 Chronic diastolic (congestive) heart failure: Secondary | ICD-10-CM

## 2021-06-07 DIAGNOSIS — E119 Type 2 diabetes mellitus without complications: Secondary | ICD-10-CM | POA: Diagnosis not present

## 2021-06-07 MED ORDER — FUROSEMIDE 20 MG PO TABS: 20.0000 mg | ORAL_TABLET | Freq: Every day | ORAL | 3 refills | Status: DC

## 2021-06-07 NOTE — Patient Instructions (Signed)
Medication Instructions:   Your physician recommends that you continue on your current medications as directed. Please refer to the Current Medication list given to you today.  Refill has been sent to for Lasix 20 mg daily. May take an additional Lasix 20 mg AS NEEDED for swelling and/or weight gain.  *If you need a refill on your cardiac medications before your next appointment, please call your pharmacy*   Lab Work:  None ordered  Testing/Procedures:  None ordered   Follow-Up: At [facility], you and your health needs are our priority.  As part of our continuing mission to provide you with exceptional heart care, we have created designated Provider Care Teams.  These Care Teams include your primary Cardiologist (physician) and Advanced Practice Providers (APPs -  Physician Assistants and Nurse Practitioners) who all work together to provide you with the care you need, when you need it.  We recommend signing up for the patient portal called "MyChart".  Sign up information is provided on this After Visit Summary.  MyChart is used to connect with patients for Virtual Visits (Telemedicine).  Patients are able to view lab/test results, encounter notes, upcoming appointments, etc.  Non-urgent messages can be sent to your provider as well.   To learn more about what you can do with MyChart, go to [email or web address].    Your next appointment:   1 year(s)  The format for your next appointment:   In Person  Provider:   You may see Nelva Bush, MD or one of the following Advanced Practice Providers on your designated Care Team:   Murray Hodgkins, NP Christell Faith, PA-C Cadence Kathlen Mody, [place]

## 2021-06-08 ENCOUNTER — Ambulatory Visit: Payer: BC Managed Care – PPO | Admitting: Internal Medicine

## 2021-06-09 ENCOUNTER — Telehealth: Payer: Self-pay | Admitting: Nurse Practitioner

## 2021-06-09 DIAGNOSIS — M1711 Unilateral primary osteoarthritis, right knee: Secondary | ICD-10-CM | POA: Diagnosis not present

## 2021-06-09 NOTE — Telephone Encounter (Signed)
Left a message for patient to give our office a call back to discuss clarification if the patient is currently taking the Trulicity medication. As patient had an EKG on 06/07/21 and it looks like patient reported "not taking" in chart. Patient advised to give our office a call back to clarify medication.

## 2021-06-09 NOTE — Telephone Encounter (Signed)
Copied from [place] [facility] [id number]. Topic: General - Other >> Jun 09, 2021 11:37 AM Leward Quan A wrote: Reason for CRM: Patient called in to inform [name] that she is still waiting to get the Trulicity say that she was told by the pharmacy that they are waiting on the office to know if the prior auth have been approved or denied. Patient waiting to hear from Dr about medication can be reached at  Ph# [phone]

## 2021-06-12 NOTE — Telephone Encounter (Signed)
Spoke with patient and informed me that she has received her Trulicity and she is currently taking the medication. Patient verbalized understanding.

## 2021-06-14 ENCOUNTER — Other Ambulatory Visit: Payer: Self-pay

## 2021-06-14 ENCOUNTER — Encounter: Payer: Self-pay | Admitting: Physician Assistant

## 2021-06-14 ENCOUNTER — Other Ambulatory Visit: Payer: Self-pay | Admitting: Nurse Practitioner

## 2021-06-14 ENCOUNTER — Ambulatory Visit: Payer: Self-pay

## 2021-06-14 ENCOUNTER — Telehealth: Payer: Self-pay | Admitting: General Practice

## 2021-06-14 ENCOUNTER — Ambulatory Visit (INDEPENDENT_AMBULATORY_CARE_PROVIDER_SITE_OTHER): Payer: BC Managed Care – PPO | Admitting: Physician Assistant

## 2021-06-14 DIAGNOSIS — M25562 Pain in left knee: Secondary | ICD-10-CM

## 2021-06-14 DIAGNOSIS — E1165 Type 2 diabetes mellitus with hyperglycemia: Secondary | ICD-10-CM

## 2021-06-14 DIAGNOSIS — R296 Repeated falls: Secondary | ICD-10-CM

## 2021-06-14 DIAGNOSIS — Z794 Long term (current) use of insulin: Secondary | ICD-10-CM

## 2021-06-14 DIAGNOSIS — F319 Bipolar disorder, unspecified: Secondary | ICD-10-CM | POA: Diagnosis not present

## 2021-06-14 DIAGNOSIS — I10 Essential (primary) hypertension: Secondary | ICD-10-CM

## 2021-06-14 DIAGNOSIS — G4700 Insomnia, unspecified: Secondary | ICD-10-CM

## 2021-06-14 DIAGNOSIS — F419 Anxiety disorder, unspecified: Secondary | ICD-10-CM

## 2021-06-14 DIAGNOSIS — K529 Noninfective gastroenteritis and colitis, unspecified: Secondary | ICD-10-CM

## 2021-06-14 DIAGNOSIS — F329 Major depressive disorder, single episode, unspecified: Secondary | ICD-10-CM

## 2021-06-14 DIAGNOSIS — E119 Type 2 diabetes mellitus without complications: Secondary | ICD-10-CM

## 2021-06-14 DIAGNOSIS — G8929 Other chronic pain: Secondary | ICD-10-CM

## 2021-06-14 DIAGNOSIS — F411 Generalized anxiety disorder: Secondary | ICD-10-CM | POA: Diagnosis not present

## 2021-06-14 DIAGNOSIS — G2581 Restless legs syndrome: Secondary | ICD-10-CM | POA: Diagnosis not present

## 2021-06-14 DIAGNOSIS — M79605 Pain in left leg: Secondary | ICD-10-CM

## 2021-06-14 MED ORDER — QUETIAPINE FUMARATE ER 300 MG PO TB24: 300.0000 mg | ORAL_TABLET | Freq: Every day | ORAL | 1 refills | Status: DC

## 2021-06-14 NOTE — Patient Instructions (Signed)
Visit Information  RNCM Clinical Goal(s):  Patient will verbalize understanding of plan for management of HTN, DMII, Anxiety, Depression, Bipolar Disorder, and colitis, chron's, chronic pain, and falls prevention  as evidenced by no new falls, stable pain, stable chronic conditions, checking blood sugars as directed and calling the office for changes in conditions take all medications exactly as prescribed and will call provider for medication related questions as evidenced by compliance with medications and calling for refills before running out of medications     attend all scheduled medical appointments: 07-24-2021 at 0900 am as evidenced by keeping appointments and calling for schedule changes needs         demonstrate improved and ongoing health management independence as evidenced by effective management of chronic conditions and working with the CCM team to optimize health and well being        demonstrate a decrease in HTN, DMII, Anxiety, Depression, Bipolar Disorder, and Colitis/Chron's, chronic pain, and falls prevention and safety  exacerbations  as evidenced by compliance with the plan of care and working with the pcp and CCM team to effectively manage chronic conditions  demonstrate ongoing self health care management ability for effective management of chronic conditions as evidenced by working with the CCM team  through collaboration with Consulting civil engineer, provider, and care team.   Interventions: 1:1 collaboration with primary care provider regarding development and update of comprehensive plan of care as evidenced by provider attestation and co-signature Inter-disciplinary care team collaboration (see longitudinal plan of care) Evaluation of current treatment plan related to  self management and patient's adherence to plan as established by provider   Diabetes:  (Status: Goal on Track (progressing): YES.) Long Term Goal   Lab Results  Component Value Date   HGBA1C 8.9 (H)  04/19/2021  Assessed patient's understanding of A1c goal: <7% Provided education to patient about basic DM disease process; Reviewed medications with patient and discussed importance of medication adherence;        Reviewed prescribed diet with patient heart healthy/ADA; Counseled on importance of regular laboratory monitoring as prescribed;        Discussed plans with patient for ongoing care management follow up and provided patient with direct contact information for care management team;      Provided patient with written educational materials related to hypo and hyperglycemia and importance of correct treatment. 06-14-2021: Denies any lows at this time, has not been checking so does not have numbers to provide to [facility];       Reviewed scheduled/upcoming provider appointments including: 07-24-2021 at 0900 am;         Advised patient, providing education and rationale, to check cbg as directed  and record. 06-14-2021: The patient has not been checking blood sugars on a consistent basis. Reviewed the benefits of checking blood sugars and knowing what her levels are. Goals of fasting are <130 and <180 post prandial. The patient states she has trulicity now and she will start taking blood sugars.        call provider for findings outside established parameters;       Review of patient status, including review of consultants reports, relevant laboratory and other test results, and medications completed;       Advised patient to discuss discuss blood sugar trends, and changes in health( the patient has had a recent UTI and toe infection) with provider;       Falls:  (Status: Goal on Track (progressing): YES.) Long Term Goal  Provided  written and verbal education re: potential causes of falls and Fall prevention strategies Reviewed medications and discussed potential side effects of medications such as dizziness and frequent urination Advised patient of importance of notifying provider of falls.  06-14-2021: Denies any new falls, is using her cane when ambulating. Assessed for signs and symptoms of orthostatic hypotension Assessed for falls since last encounter Assessed patients knowledge of fall risk prevention secondary to previously provided education Provided patient information for fall alert systems Assessed working status of life alert bracelet and patient adherence Advised patient to discuss new falls and safety concerns with provider   Depression, Anxiety, Depression   (Status: Goal on Track (progressing): YES.) Long Term Goal  Evaluation of current treatment plan related to Anxiety, Depression, and Bipolar Disorder, Mental Health Concerns  self-management and patient's adherence to plan as established by provider. Discussed plans with patient for ongoing care management follow up and provided patient with direct contact information for care management team Advised patient to provide appropriate vaccination information to provider or CM team member at next visit; Advised patient to call the office for changes in mood, anxiety, or depression ; Provided education to patient re: keeping appointments and working with [facility] to maintain mental health and well being ; Reviewed medications with patient and discussed compliance ; Reviewed scheduled/upcoming provider appointments including 07-24-2021 at 0900 am; Discussed plans with patient for ongoing care management follow up and provided patient with direct contact information for care management team; Advised patient to discuss changes in mental health with provider; Screening for signs and symptoms of depression related to chronic disease state;  Assessed social determinant of health barriers;    Colitis/Chron's  (Status: Goal on Track (progressing): YES.) Long Term Goal  Evaluation of current treatment plan related to  Colitis/Chron's ,  self-management and patient's adherence to plan as established by provider. Discussed plans with  patient for ongoing care management follow up and provided patient with direct contact information for care management team Advised patient to call the office for changes in Chron's or Colitis ; Reviewed medications with patient and discussed compliance. The patient has been taking infusions and these are working well for her colitis/Chron's ; Reviewed scheduled/upcoming provider appointments including 07-24-2021 and several upcoming with the specialist ; Discussed plans with patient for ongoing care management follow up and provided patient with direct contact information for care management team; Advised patient to discuss new concerns with her GI health with provider;  Hypertension: (Status: Goal on Track (progressing): YES.) Last practice recorded BP readings:  BP Readings from Last 3 Encounters:  06/07/21 120/86  06/01/21 125/78  05/24/21 135/83  Most recent eGFR/CrCl:  Lab Results  Component Value Date   EGFR 99 04/19/2021    No components found for: CRCL  Evaluation of current treatment plan related to hypertension self management and patient's adherence to plan as established by provider;   Provided education to patient re: stroke prevention, s/s of heart attack and stroke; Reviewed prescribed diet heart healthy Reviewed medications with patient and discussed importance of compliance;  Discussed plans with patient for ongoing care management follow up and provided patient with direct contact information for care management team; Advised patient, providing education and rationale, to monitor blood pressure daily and record, calling PCP for findings outside established parameters;  Provided education on prescribed diet heart healthy ;  Discussed complications of poorly controlled blood pressure such as heart disease, stroke, circulatory complications, vision complications, kidney impairment, sexual dysfunction;   Pain:  (  Status: Goal on track: NO.) Long Term Goal  Pain assessment  performed. 06-14-2021: the patient rates her pain at an 8 today. States that she has had several appointments and it is worse today. The patient states that she is waiting for approval to have the "gel injections". She is taking PT. If the injections do not work she is looking at Knee replacement surgery. Medications reviewed Reviewed provider established plan for pain management; Discussed importance of adherence to all scheduled medical appointments; Counseled on the importance of reporting any/all new or changed pain symptoms or management strategies to pain management provider; Advised patient to report to care team affect of pain on daily activities; Discussed use of relaxation techniques and/or diversional activities to assist with pain reduction (distraction, imagery, relaxation, massage, acupressure, TENS, heat, and cold application; Reviewed with patient prescribed pharmacological and nonpharmacological pain relief strategies; Advised patient to discuss uncontrolled pain, changes in level or intensity of pain  with provider;  Patient Goals/Self-Care Activities: Patient will self administer medications as prescribed as evidenced by self report/primary caregiver report  Patient will attend all scheduled provider appointments as evidenced by clinician review of documented attendance to scheduled appointments and patient/caregiver report Patient will call pharmacy for medication refills as evidenced by patient report and review of pharmacy fill history as appropriate Patient will attend church or other social activities as evidenced by patient report Patient will continue to perform ADL's independently as evidenced by patient/caregiver report Patient will continue to perform IADL's independently as evidenced by patient/caregiver report Patient will call provider office for new concerns or questions as evidenced by review of documented incoming telephone call notes and patient report Patient  will work with BSW to address care coordination needs and will continue to work with the clinical team to address health care and disease management related needs as evidenced by documented adherence to scheduled care management/care coordination appointments keep appointment with eye doctor check blood sugar at prescribed times: before meals and at bedtime, when you have symptoms of low or high blood sugar, before and after exercise, and as directed by the pcp, the patient has not been taking blood sugars, explained the benefits of checking blood sugars on a consistent basis  check feet daily for cuts, sores or redness enter blood sugar readings and medication or insulin into daily log take the blood sugar log to all doctor visits take the blood sugar meter to all doctor visits trim toenails straight across drink 6 to 8 glasses of water each day eat fish at least once per week fill half of plate with vegetables keep a food diary limit fast food meals to no more than 1 per week manage portion size prepare main meal at home 3 to 5 days each week read food labels for fat, fiber, carbohydrates and portion size reduce red meat to 2 to 3 times a week keep feet up while sitting wash and dry feet carefully every day wear comfortable, cotton socks wear comfortable, well-fitting shoes - check blood pressure weekly - choose a place to take my blood pressure (home, clinic or office, retail store) - write blood pressure results in a log or diary - learn about high blood pressure - keep a blood pressure log - take blood pressure log to all doctor appointments - call doctor for signs and symptoms of high blood pressure - develop an action plan for high blood pressure - keep all doctor appointments - take medications for blood pressure exactly as prescribed - report  new symptoms to your doctor - eat more whole grains, fruits and vegetables, lean meats and healthy fats    Patient verbalizes  understanding of instructions provided today and agrees to view in [place].   Telephone follow up appointment with care management team member scheduled for: 08-08-2021 at 145 pm  Noreene Larsson RN, MSN, [facility] Mobile: [phone]

## 2021-06-14 NOTE — Telephone Encounter (Signed)
Routing to provider for refill if possible.

## 2021-06-14 NOTE — Patient Instructions (Signed)
Wean off the Risperdal 2 mg by taking 1/2 pill every night for 6 or 7 nights and then stop it.  Go ahead and start Seroquel XR 300 mg 1 every night at the same time.

## 2021-06-14 NOTE — Chronic Care Management (AMB) (Signed)
Chronic Care Management   CCM RN Visit Note  06/14/2021 Name: Amy Hansen MRN: [id number] DOB: [birth year]  Subjective: Amy Hansen is a 65 y.o. year old female who is a primary care patient of [name], NP. The care management team was consulted for assistance with disease management and care coordination needs.    Engaged with patient by telephone for follow up visit in response to provider referral for case management and/or care coordination services.   Consent to Services:  The patient was given information about Chronic Care Management services, agreed to services, and gave verbal consent prior to initiation of services.  Please see initial visit note for detailed documentation.   Patient agreed to services and verbal consent obtained.   Assessment: Review of patient past medical history, allergies, medications, health status, including review of consultants reports, laboratory and other test data, was performed as part of comprehensive evaluation and provision of chronic care management services.   SDOH (Social Determinants of Health) assessments and interventions performed:    CCM Care Plan  Allergies  Allergen Reactions   Avelox [Moxifloxacin Hcl In Nacl] Anaphylaxis   Bactrim [Sulfamethoxazole-Trimethoprim] Anaphylaxis   Ciprofloxacin Other (See Comments)    Pt states she was told never to take this as it is in the same family as Avelox.    Depakote [Divalproex Sodium] Other (See Comments)    Unknown Reaction   Imitrex [Sumatriptan] Other (See Comments)    Neck and shoulder pain   Stadol [Butorphanol] Rash    Outpatient Encounter Medications as of 06/14/2021  Medication Sig   ALPRAZolam (XANAX) 1 MG tablet Take 1 tablet (1 mg total) by mouth 3 (three) times daily as needed for anxiety.   buPROPion (WELLBUTRIN XL) 150 MG 24 hr tablet Take 3 tablets by mouth once daily   dicyclomine (BENTYL) 10 MG capsule Take 1 capsule (10 mg total) by mouth 3 (three)  times daily as needed for spasms.   doxycycline (VIBRAMYCIN) 100 MG capsule Take 100 mg by mouth 2 (two) times daily.   Dulaglutide (TRULICITY) 2.33 ID/5.6YS SOPN Inject 0.75 mg into the skin once a week.   furosemide (LASIX) 20 MG tablet Take 1 tablet (20 mg total) by mouth daily. May take an additional tablet (17m) AS NEEDED for weight gain and/or swelling.   gabapentin (NEURONTIN) 100 MG capsule Take 100 mg by mouth 2 (two) times daily as needed.   glipiZIDE (GLUCOTROL) 5 MG tablet Take 1 tablet by mouth twice daily.   lamoTRIgine (LAMICTAL) 200 MG tablet Take 2 tablets (400 mg total) by mouth at bedtime.   metFORMIN (GLUCOPHAGE) 1000 MG tablet Take 1 tablet by mouth twice daily.   ondansetron (ZOFRAN) 4 MG tablet Take 1 tablet (4 mg total) by mouth daily as needed for nausea or vomiting.   pramipexole (MIRAPEX) 1 MG tablet Take 1 tablet (1 mg total) by mouth at bedtime.   QUEtiapine (SEROQUEL XR) 300 MG 24 hr tablet Take 1 tablet (300 mg total) by mouth at bedtime.   sertraline (ZOLOFT) 100 MG tablet Take 2 tablets (200 mg total) by mouth daily.   vedolizumab (ENTYVIO) 300 MG injection Inject into the vein every 8 (eight) weeks.   No facility-administered encounter medications on file as of 06/14/2021.    Patient Active Problem List   Diagnosis Date Noted   TIA (transient ischemic attack) 02/20/2021   Protein-calorie malnutrition, severe 02/09/2021   Abnormal CT scan, gastrointestinal tract    BRBPR (bright red blood per  rectum) 05/01/2020   Weakness 04/25/2020   Pancolitis ([place]) 04/25/2020   Type 2 diabetes mellitus ([place]) 04/25/2020   Colitis 04/21/2020   AKI (acute kidney injury) ([place]) 04/21/2020   Major depression, chronic 06/01/2018   OCD (obsessive compulsive disorder) 06/01/2018   Chronic heart failure with preserved ejection fraction ([place]) 05/16/2018   Left leg pain 11/04/2017   GERD (gastroesophageal reflux disease) 02/18/2017   Iron deficiency anemia 10/18/2016   Leg  swelling 07/17/2016   Hypertension 03/21/2016   Vitamin D deficiency 02/21/2016   B12 deficiency 02/21/2016   Poorly controlled type 2 diabetes mellitus ([place]) 09/13/2015   Migraines 05/23/2015   Restless legs syndrome (RLS) 05/23/2015   Recurrent UTI 04/26/2015   Atrophic vaginitis 04/26/2015   Dyspareunia, female 04/26/2015    Conditions to be addressed/monitored:HTN, DMII, Anxiety, Depression, Bipolar Disorder, and Colitis/Chron's, chronic pain and falls prevention and safety                  Care Plan : RNCM: General Plan of Care (Adult) for Chronic Disease Management and Care Coordination Needs  Updates made by Vanita Ingles, RN since 06/14/2021 12:00 AM     Problem: RNCM: Development of Plan of Care for Chronic Disease Management (DM, HTN, Colitis, Chrons, anxiety, depression, Bipolar, chronic pain, Falls)   Priority: High     Long-Range Goal: RNCM: Effective Management of Plan of Care for Chronic Disease Management (DM, HTN, Colitis, Chrons, anxiety, depression, Bipolar, chronic pain, Falls)   Start Date: 08/14/2020  Expected End Date: 08/14/2021  Priority: High  Note:   Current Barriers:  Knowledge Deficits related to plan of care for management of HTN, DMII, Anxiety with Excessive Worry,, Depression: depressed mood anxiety disturbed sleep, and Bipolar Disorder, and Colitis/Chron's, chronic pain, falls prevention and safety  Chronic Disease Management support and education needs related to HTN, DMII, Anxiety with Excessive Worry,, Depression: depressed mood anxiety insomnia, Bipolar Disorder, and Insomnia/Sleep Difficulties, and Colitis, chron's, chronic pain, and falls and safety  RNCM Clinical Goal(s):  Patient will verbalize understanding of plan for management of HTN, DMII, Anxiety, Depression, Bipolar Disorder, and colitis, chron's, chronic pain, and falls prevention  as evidenced by no new falls, stable pain, stable chronic conditions, checking blood  sugars as directed and calling the office for changes in conditions take all medications exactly as prescribed and will call provider for medication related questions as evidenced by compliance with medications and calling for refills before running out of medications     attend all scheduled medical appointments: 07-24-2021 at 0900 am as evidenced by keeping appointments and calling for schedule changes needs         demonstrate improved and ongoing health management independence as evidenced by effective management of chronic conditions and working with the CCM team to optimize health and well being        demonstrate a decrease in HTN, DMII, Anxiety, Depression, Bipolar Disorder, and Colitis/Chron's, chronic pain, and falls prevention and safety  exacerbations  as evidenced by compliance with the plan of care and working with the pcp and CCM team to effectively manage chronic conditions  demonstrate ongoing self health care management ability for effective management of chronic conditions as evidenced by working with the CCM team  through collaboration with Consulting civil engineer, provider, and care team.   Interventions: 1:1 collaboration with primary care provider regarding development and update of comprehensive plan of care as evidenced by provider attestation and co-signature Inter-disciplinary care team collaboration (see longitudinal plan  of care) Evaluation of current treatment plan related to  self management and patient's adherence to plan as established by provider   Diabetes:  (Status: Goal on Track (progressing): YES.) Long Term Goal   Lab Results  Component Value Date   HGBA1C 8.9 (H) 04/19/2021  Assessed patient's understanding of A1c goal: <7% Provided education to patient about basic DM disease process; Reviewed medications with patient and discussed importance of medication adherence;        Reviewed prescribed diet with patient heart healthy/ADA; Counseled on importance of  regular laboratory monitoring as prescribed;        Discussed plans with patient for ongoing care management follow up and provided patient with direct contact information for care management team;      Provided patient with written educational materials related to hypo and hyperglycemia and importance of correct treatment. 06-14-2021: Denies any lows at this time, has not been checking so does not have numbers to provide to [facility];       Reviewed scheduled/upcoming provider appointments including: 07-24-2021 at 0900 am;         Advised patient, providing education and rationale, to check cbg as directed  and record. 06-14-2021: The patient has not been checking blood sugars on a consistent basis. Reviewed the benefits of checking blood sugars and knowing what her levels are. Goals of fasting are <130 and <180 post prandial. The patient states she has trulicity now and she will start taking blood sugars.        call provider for findings outside established parameters;       Review of patient status, including review of consultants reports, relevant laboratory and other test results, and medications completed;       Advised patient to discuss discuss blood sugar trends, and changes in health( the patient has had a recent UTI and toe infection) with provider;       Falls:  (Status: Goal on Track (progressing): YES.) Long Term Goal  Provided written and verbal education re: potential causes of falls and Fall prevention strategies Reviewed medications and discussed potential side effects of medications such as dizziness and frequent urination Advised patient of importance of notifying provider of falls. 06-14-2021: Denies any new falls, is using her cane when ambulating. Assessed for signs and symptoms of orthostatic hypotension Assessed for falls since last encounter Assessed patients knowledge of fall risk prevention secondary to previously provided education Provided patient information for fall alert  systems Assessed working status of life alert bracelet and patient adherence Advised patient to discuss new falls and safety concerns with provider   Depression, Anxiety, Depression   (Status: Goal on Track (progressing): YES.) Long Term Goal  Evaluation of current treatment plan related to Anxiety, Depression, and Bipolar Disorder, Mental Health Concerns  self-management and patient's adherence to plan as established by provider. Discussed plans with patient for ongoing care management follow up and provided patient with direct contact information for care management team Advised patient to provide appropriate vaccination information to provider or CM team member at next visit; Advised patient to call the office for changes in mood, anxiety, or depression ; Provided education to patient re: keeping appointments and working with [facility] to maintain mental health and well being ; Reviewed medications with patient and discussed compliance ; Reviewed scheduled/upcoming provider appointments including 07-24-2021 at 0900 am; Discussed plans with patient for ongoing care management follow up and provided patient with direct contact information for care management team; Advised patient to discuss changes  in mental health with provider; Screening for signs and symptoms of depression related to chronic disease state;  Assessed social determinant of health barriers;    Colitis/Chron's  (Status: Goal on Track (progressing): YES.) Long Term Goal  Evaluation of current treatment plan related to  Colitis/Chron's ,  self-management and patient's adherence to plan as established by provider. Discussed plans with patient for ongoing care management follow up and provided patient with direct contact information for care management team Advised patient to call the office for changes in Chron's or Colitis ; Reviewed medications with patient and discussed compliance. The patient has been taking infusions and these are  working well for her colitis/Chron's ; Reviewed scheduled/upcoming provider appointments including 07-24-2021 and several upcoming with the specialist ; Discussed plans with patient for ongoing care management follow up and provided patient with direct contact information for care management team; Advised patient to discuss new concerns with her GI health with provider;  Hypertension: (Status: Goal on Track (progressing): YES.) Last practice recorded BP readings:  BP Readings from Last 3 Encounters:  06/07/21 120/86  06/01/21 125/78  05/24/21 135/83  Most recent eGFR/CrCl:  Lab Results  Component Value Date   EGFR 99 04/19/2021    No components found for: CRCL  Evaluation of current treatment plan related to hypertension self management and patient's adherence to plan as established by provider;   Provided education to patient re: stroke prevention, s/s of heart attack and stroke; Reviewed prescribed diet heart healthy Reviewed medications with patient and discussed importance of compliance;  Discussed plans with patient for ongoing care management follow up and provided patient with direct contact information for care management team; Advised patient, providing education and rationale, to monitor blood pressure daily and record, calling PCP for findings outside established parameters;  Provided education on prescribed diet heart healthy ;  Discussed complications of poorly controlled blood pressure such as heart disease, stroke, circulatory complications, vision complications, kidney impairment, sexual dysfunction;   Pain:  (Status: Goal on track: NO.) Long Term Goal  Pain assessment performed. 06-14-2021: the patient rates her pain at an 8 today. States that she has had several appointments and it is worse today. The patient states that she is waiting for approval to have the "gel injections". She is taking PT. If the injections do not work she is looking at Knee replacement  surgery. Medications reviewed Reviewed provider established plan for pain management; Discussed importance of adherence to all scheduled medical appointments; Counseled on the importance of reporting any/all new or changed pain symptoms or management strategies to pain management provider; Advised patient to report to care team affect of pain on daily activities; Discussed use of relaxation techniques and/or diversional activities to assist with pain reduction (distraction, imagery, relaxation, massage, acupressure, TENS, heat, and cold application; Reviewed with patient prescribed pharmacological and nonpharmacological pain relief strategies; Advised patient to discuss uncontrolled pain, changes in level or intensity of pain  with provider;  Patient Goals/Self-Care Activities: Patient will self administer medications as prescribed as evidenced by self report/primary caregiver report  Patient will attend all scheduled provider appointments as evidenced by clinician review of documented attendance to scheduled appointments and patient/caregiver report Patient will call pharmacy for medication refills as evidenced by patient report and review of pharmacy fill history as appropriate Patient will attend church or other social activities as evidenced by patient report Patient will continue to perform ADL's independently as evidenced by patient/caregiver report Patient will continue to perform IADL's independently as evidenced  by patient/caregiver report Patient will call provider office for new concerns or questions as evidenced by review of documented incoming telephone call notes and patient report Patient will work with BSW to address care coordination needs and will continue to work with the clinical team to address health care and disease management related needs as evidenced by documented adherence to scheduled care management/care coordination appointments keep appointment with eye doctor check  blood sugar at prescribed times: before meals and at bedtime, when you have symptoms of low or high blood sugar, before and after exercise, and as directed by the pcp, the patient has not been taking blood sugars, explained the benefits of checking blood sugars on a consistent basis  check feet daily for cuts, sores or redness enter blood sugar readings and medication or insulin into daily log take the blood sugar log to all doctor visits take the blood sugar meter to all doctor visits trim toenails straight across drink 6 to 8 glasses of water each day eat fish at least once per week fill half of plate with vegetables keep a food diary limit fast food meals to no more than 1 per week manage portion size prepare main meal at home 3 to 5 days each week read food labels for fat, fiber, carbohydrates and portion size reduce red meat to 2 to 3 times a week keep feet up while sitting wash and dry feet carefully every day wear comfortable, cotton socks wear comfortable, well-fitting shoes - check blood pressure weekly - choose a place to take my blood pressure (home, clinic or office, retail store) - write blood pressure results in a log or diary - learn about high blood pressure - keep a blood pressure log - take blood pressure log to all doctor appointments - call doctor for signs and symptoms of high blood pressure - develop an action plan for high blood pressure - keep all doctor appointments - take medications for blood pressure exactly as prescribed - report new symptoms to your doctor - eat more whole grains, fruits and vegetables, lean meats and healthy fats       Plan:Telephone follow up appointment with care management team member scheduled for:  08-08-2021 at 1:45 pm  Noreene Larsson RN, MSN, [facility] Mobile: [phone]

## 2021-06-14 NOTE — Progress Notes (Signed)
[facility]  Patient ID: Amy Hansen,  MRN: [id number]  PCP: [name], NP  Date of Evaluation: 06/14/2021 Time spent:40 minutes  Chief Complaint:  Chief Complaint   Anxiety; Depression; Insomnia; Follow-up      HISTORY/CURRENT STATUS: For 6 week med check.  Mood is better.  Able to enjoy things.  Energy and motivation are good unless she has not slept well.  She does not cry easily.  Appetite is normal and weight is stable.  No changes in memory.  No suicidal or homicidal thoughts.  Still has a lot of anxiety but it does seem to be better overall.  She is not taking 3 Xanax every day like she had been, but always needs 2.5 pills daily.  Not having panic attacks so much is generalized sense of doom, no known trigger.  Xanax does help.  States her blood sugar is running good and she has been taken off insulin and started on Trulicity.  Has no problems with her cholesterol.  She is still not sleeping well, gets about 5 hours of sleep on average but she has noticed some improvement since being on the higher dose of Seroquel.  Mostly has trouble staying asleep.  She wakes up and sometimes is unable to go back to sleep, usually about 3 or 4 in the morning.  Other times she can go back to sleep though and does feel more rested the next morning.  Review of Systems  Constitutional: Negative.   HENT: Negative.    Eyes: Negative.   Respiratory: Negative.    Cardiovascular: Negative.   Gastrointestinal: Negative.   Genitourinary: Negative.   Musculoskeletal:  Positive for joint pain.       Right knee pain.  Skin: Negative.   Neurological: Negative.   Endo/Heme/Allergies: Negative.   Psychiatric/Behavioral:         See HPI.    Individual Medical History/ Review of Systems: Changes? :Yes   right knee problem since LOV, saw ER ortho once she wasn't able to move it. Will have 3 shots (prob rooster comb)   Past medications for mental health diagnoses  include: Trazodone, Risperdal, Zoloft, Lunesta, prazosin, Sonata, Prozac, Depakote, Lamictal, lithium, Wellbutrin, Xanax, Ambien, carbamazepine, Seroquel   Allergies: Avelox [moxifloxacin hcl in nacl], Bactrim [sulfamethoxazole-trimethoprim], Ciprofloxacin, Depakote [divalproex sodium], Imitrex [sumatriptan], and Stadol [butorphanol]  Current Medications:  Current Outpatient Medications:    ALPRAZolam (XANAX) 1 MG tablet, Take 1 tablet (1 mg total) by mouth 3 (three) times daily as needed for anxiety., Disp: 90 tablet, Rfl: 5   buPROPion (WELLBUTRIN XL) 150 MG 24 hr tablet, Take 3 tablets by mouth once daily, Disp: 270 tablet, Rfl: 3   dicyclomine (BENTYL) 10 MG capsule, Take 1 capsule (10 mg total) by mouth 3 (three) times daily as needed for spasms., Disp: 90 capsule, Rfl: 4   doxycycline (VIBRAMYCIN) 100 MG capsule, Take 100 mg by mouth 2 (two) times daily., Disp: , Rfl:    Dulaglutide (TRULICITY) 2.99 BZ/1.6RC SOPN, Inject 0.75 mg into the skin once a week., Disp: 6 mL, Rfl: 0   furosemide (LASIX) 20 MG tablet, Take 1 tablet (20 mg total) by mouth daily. May take an additional tablet (66m) AS NEEDED for weight gain and/or swelling., Disp: 90 tablet, Rfl: 3   gabapentin (NEURONTIN) 100 MG capsule, Take 100 mg by mouth 2 (two) times daily as needed., Disp: , Rfl:    glipiZIDE (GLUCOTROL) 5 MG tablet, Take 1 tablet by mouth twice daily., Disp: 180  tablet, Rfl: 1   lamoTRIgine (LAMICTAL) 200 MG tablet, Take 2 tablets (400 mg total) by mouth at bedtime., Disp: 60 tablet, Rfl: 11   metFORMIN (GLUCOPHAGE) 1000 MG tablet, Take 1 tablet by mouth twice daily., Disp: 180 tablet, Rfl: 1   ondansetron (ZOFRAN) 4 MG tablet, Take 1 tablet (4 mg total) by mouth daily as needed for nausea or vomiting., Disp: 30 tablet, Rfl: 1   pramipexole (MIRAPEX) 1 MG tablet, Take 1 tablet (1 mg total) by mouth at bedtime., Disp: 90 tablet, Rfl: 0   QUEtiapine (SEROQUEL XR) 300 MG 24 hr tablet, Take 1 tablet (300 mg total)  by mouth at bedtime., Disp: 30 tablet, Rfl: 1   sertraline (ZOLOFT) 100 MG tablet, Take 2 tablets (200 mg total) by mouth daily., Disp: 60 tablet, Rfl: 11   vedolizumab (ENTYVIO) 300 MG injection, Inject into the vein every 8 (eight) weeks., Disp: , Rfl:  Medication Side Effects: none  Family Medical/ Social History: Changes?  No  MENTAL HEALTH EXAM:  There were no vitals taken for this visit.There is no height or weight on file to calculate BMI.  General Appearance: Casual, Neat, and Well Groomed  Eye Contact:  Good  Speech:  Clear and Coherent and Normal Rate  Volume:  Normal  Mood:  Euthymic  Affect:  Appropriate  Thought Process:  Goal Directed and Descriptions of Associations: Intact  Orientation:  Full (Time, Place, and Person)  Thought Content: Logical   Suicidal Thoughts:  No  Homicidal Thoughts:  No  Memory:  WNL  Judgement:  Good  Insight:  Good  Psychomotor Activity:  Normal  Concentration:  Concentration: Good and Attention Span: Good  Recall:  Good  Fund of Knowledge: Good  Language: Good  Assets:  Desire for Improvement Financial Resources/Insurance Housing Social Support Transportation  ADL's:  Intact  Cognition: WNL  Prognosis:  Good   Labs 04/19/2021 Hgb A1C 8.9 down from 11.7 done 2 months ago. Glu 164, AST 71, ALT 111, o/w nl.  05/19/2021 AST 41, ALT 45.   DIAGNOSES:    ICD-10-CM   1. Bipolar I disorder ([place])  F31.9     2. Insomnia, unspecified type  G47.00     3. Restless legs syndrome (RLS)  G25.81     4. Generalized anxiety disorder  F41.1        Receiving Psychotherapy: No    RECOMMENDATIONS:  PDMP reviewed.  Last Xanax filled 05/30/2020.  Tramadol known to me. I provided 40 minutes of face to face time during this encounter, including time spent before and after the visit in records review, medical decision making, counseling pertinent to today's visit, and charting.  Discussed weaning off of Risperdal and increasing the Seroquel.   Seroquel has helped her sleep more and I do not want her to be on 2 antipsychotics due to the risk of increasing glucose, cholesterol, and movement disorders.  She understands and would like to change. Sleep hygiene was discussed.  No screen time within 2 hours of the time she wants to go to sleep, or use blue blocker glasses if she is going to be on her phone or tablet.  Also no caffeine after lunch, she has been drinking a [employer] at suppertime occasionally.  She will try caffeine free [facility] instead. Wean off Risperdal 2 mg by taking 1/2 pill nightly for 6 days and then stop. Start Seroquel XR 300 mg, 1 p.o. nightly at the same time she is weaning off Risperdal.  Continue Xanax 1 mg, 1 po tid prn. Or ok to take 1/2 pill, 6 times per day. Not over 3 pills per day. Continue Wellbutrin XL 150 mg, 3 p.o. daily. Continue Lamictal 200 mg, 1 p.o. twice daily. Continue Mirapex 1 mg p.o. nightly but take routinely not as needed. Continue Zoloft 100 mg, 2 p.o. daily. Continue Gabapentin 100 mg, 1 po bid prn.  Recommend counseling. Return in 4 to 6 weeks.  Donnal Moat, PA-C

## 2021-06-14 NOTE — Telephone Encounter (Signed)
Pt walked in the office wanting to be seen today because medication MIRAPEX is expired and needs a refill. Pt made an appt for tomorrow and would like to have something to hold her until then. Pt would like to be called if there can be something called in for her.

## 2021-06-15 ENCOUNTER — Ambulatory Visit: Payer: BC Managed Care – PPO | Admitting: Nurse Practitioner

## 2021-06-15 ENCOUNTER — Other Ambulatory Visit: Payer: Self-pay

## 2021-06-15 MED ORDER — PRAMIPEXOLE DIHYDROCHLORIDE 1 MG PO TABS: 1.0000 mg | ORAL_TABLET | Freq: Every day | ORAL | 0 refills | Status: DC

## 2021-06-15 NOTE — Progress Notes (Deleted)
LMP  (LMP Unknown)    Subjective:    Patient ID: Amy Hansen, female    DOB: 25-May-1956, 65 y.o.   MRN: [id number]  HPI: Amy Hansen is a 65 y.o. female  No chief complaint on file.  DIABETES Patient states she has had several episodes where she has woken up and her sugars are below 60. Hypoglycemic episodes: no Polydipsia/polyuria: no Visual disturbance: no Chest pain: no Paresthesias yes Glucose Monitoring: No  Accucheck frequency: Not checking  Fasting glucose:   Post prandial:  Evening:  Before meals: Taking Insulin?: no  Long acting insulin:   Short acting insulin: Blood Pressure Monitoring: not checking  CHRON'S  Patient states her infusion of entivio for her chron's disease.  She will get her 3rd infusion today.  She can already tell a big difference in her symptoms since starting the medication.   Relevant past medical, surgical, family and social history reviewed and updated as indicated. Interim medical history since our last visit reviewed. Allergies and medications reviewed and updated.  Review of Systems  Eyes:  Negative for visual disturbance.  Cardiovascular:  Negative for chest pain.  Endocrine: Negative for polydipsia and polyuria.  Neurological:  Positive for numbness. Negative for dizziness and light-headedness.   Per HPI unless specifically indicated above     Objective:    LMP  (LMP Unknown)   Wt Readings from Last 3 Encounters:  06/07/21 139 lb (64 kg)  06/01/21 139 lb (63 kg)  05/24/21 139 lb 9.6 oz (63.3 kg)    Physical Exam Vitals and nursing note reviewed.  Constitutional:      General: She is not in acute distress.    Appearance: Normal appearance. She is normal weight. She is not ill-appearing, toxic-appearing or diaphoretic.  HENT:     Head: Normocephalic.     Right Ear: External ear normal.     Left Ear: External ear normal.     Nose: Nose normal.     Mouth/Throat:     Mouth: Mucous membranes are moist.     Pharynx:  Oropharynx is clear.  Eyes:     General:        Right eye: No discharge.        Left eye: No discharge.     Extraocular Movements: Extraocular movements intact.     Conjunctiva/sclera: Conjunctivae normal.     Pupils: Pupils are equal, round, and reactive to light.  Cardiovascular:     Rate and Rhythm: Normal rate and regular rhythm.     Heart sounds: No murmur heard. Pulmonary:     Effort: Pulmonary effort is normal. No respiratory distress.     Breath sounds: Normal breath sounds. No wheezing or rales.  Musculoskeletal:     Cervical back: Normal range of motion and neck supple.  Skin:    General: Skin is warm and dry.     Capillary Refill: Capillary refill takes less than 2 seconds.  Neurological:     General: No focal deficit present.     Mental Status: She is alert and oriented to person, place, and time. Mental status is at baseline.  Psychiatric:        Mood and Affect: Mood normal.        Behavior: Behavior normal.        Thought Content: Thought content normal.        Judgment: Judgment normal.    Results for orders placed or performed in visit on 05/19/21  Hepatic  function panel  Result Value Ref Range   Total Bilirubin 0.2 0.2 - 1.2 mg/dL   Bilirubin, Direct 0.0 0.0 - 0.3 mg/dL   Alkaline Phosphatase 98 39 - 117 U/L   AST 41 (H) 0 - 37 U/L   ALT 45 (H) 0 - 35 U/L   Total Protein 6.9 6.0 - 8.3 g/dL   Albumin 4.0 3.5 - 5.2 g/dL      Assessment & Plan:   Problem List Items Addressed This Visit   None    Follow up plan: No follow-ups on file.

## 2021-06-21 DIAGNOSIS — M2011 Hallux valgus (acquired), right foot: Secondary | ICD-10-CM | POA: Diagnosis not present

## 2021-06-27 DIAGNOSIS — M1711 Unilateral primary osteoarthritis, right knee: Secondary | ICD-10-CM | POA: Diagnosis not present

## 2021-07-03 ENCOUNTER — Other Ambulatory Visit: Payer: Self-pay | Admitting: Gastroenterology

## 2021-07-03 NOTE — Telephone Encounter (Signed)
Yes okay to refill.  Thank

## 2021-07-04 DIAGNOSIS — M1711 Unilateral primary osteoarthritis, right knee: Secondary | ICD-10-CM | POA: Diagnosis not present

## 2021-07-07 ENCOUNTER — Telehealth: Payer: Self-pay | Admitting: Physician Assistant

## 2021-07-07 NOTE — Telephone Encounter (Signed)
Patient notified 2 Rx would be sent since medication doesn't come in 125 mg dosing.

## 2021-07-07 NOTE — Telephone Encounter (Signed)
Ok to change to 125 mg, but I'll need to send in Rx, it doesn't come that strength so will have to prescribe 2 different doses of the short acting.

## 2021-07-07 NOTE — Telephone Encounter (Signed)
Patient lm stating the Seroquel she is currently taking is too much. She has decreased dosage to 150 mg, but she is still having difficulties. Please advise. # [id number].

## 2021-07-11 DIAGNOSIS — M1711 Unilateral primary osteoarthritis, right knee: Secondary | ICD-10-CM | POA: Diagnosis not present

## 2021-07-18 ENCOUNTER — Other Ambulatory Visit: Payer: Self-pay | Admitting: Physician Assistant

## 2021-07-18 MED ORDER — QUETIAPINE FUMARATE 100 MG PO TABS: 100.0000 mg | ORAL_TABLET | Freq: Every day | ORAL | 1 refills | Status: DC

## 2021-07-18 MED ORDER — QUETIAPINE FUMARATE 25 MG PO TABS: 25.0000 mg | ORAL_TABLET | Freq: Every day | ORAL | 1 refills | Status: DC

## 2021-07-18 NOTE — Telephone Encounter (Signed)
No problem.  I sent the prescription sent.

## 2021-07-24 ENCOUNTER — Ambulatory Visit: Payer: BC Managed Care – PPO | Admitting: Nurse Practitioner

## 2021-07-27 ENCOUNTER — Other Ambulatory Visit: Payer: Self-pay

## 2021-07-27 ENCOUNTER — Ambulatory Visit (INDEPENDENT_AMBULATORY_CARE_PROVIDER_SITE_OTHER): Payer: BC Managed Care – PPO

## 2021-07-27 VITALS — BP 115/70 | HR 88 | Temp 98.4°F | Resp 16 | Ht 61.0 in | Wt 144.2 lb

## 2021-07-27 DIAGNOSIS — K5100 Ulcerative (chronic) pancolitis without complications: Secondary | ICD-10-CM

## 2021-07-27 DIAGNOSIS — Z03818 Encounter for observation for suspected exposure to other biological agents ruled out: Secondary | ICD-10-CM | POA: Diagnosis not present

## 2021-07-27 DIAGNOSIS — J101 Influenza due to other identified influenza virus with other respiratory manifestations: Secondary | ICD-10-CM | POA: Diagnosis not present

## 2021-07-27 MED ORDER — VEDOLIZUMAB 300 MG IV SOLR
300.0000 mg | Freq: Once | INTRAVENOUS | Status: AC
  Administered 2021-07-27: 11:00:00 300 mg via INTRAVENOUS
  Filled 2021-07-27: qty 5

## 2021-07-27 MED ORDER — FAMOTIDINE IN NACL 20-0.9 MG/50ML-% IV SOLN: 20.0000 mg | Freq: Once | INTRAVENOUS | Status: DC | PRN

## 2021-07-27 MED ORDER — DIPHENHYDRAMINE HCL 50 MG/ML IJ SOLN: 50.0000 mg | Freq: Once | INTRAMUSCULAR | Status: DC | PRN

## 2021-07-27 MED ORDER — ALBUTEROL SULFATE HFA 108 (90 BASE) MCG/ACT IN AERS: 2.0000 | INHALATION_SPRAY | Freq: Once | RESPIRATORY_TRACT | Status: DC | PRN

## 2021-07-27 MED ORDER — SODIUM CHLORIDE 0.9 % IV SOLN: Freq: Once | INTRAVENOUS | Status: DC | PRN

## 2021-07-27 MED ORDER — METHYLPREDNISOLONE SODIUM SUCC 125 MG IJ SOLR: 125.0000 mg | Freq: Once | INTRAMUSCULAR | Status: DC | PRN

## 2021-07-27 MED ORDER — EPINEPHRINE 0.3 MG/0.3ML IJ SOAJ: 0.3000 mg | Freq: Once | INTRAMUSCULAR | Status: DC | PRN

## 2021-07-27 NOTE — Progress Notes (Signed)
Diagnosis: Crohn's Disease  Provider:  Marshell Garfinkel, MD  Procedure: Infusion  IV Type: Peripheral, IV Location: L Antecubital  Entyvio (Vedolizumab), Dose: 300 mg  Infusion Start Time: 5217  Infusion Stop Time: 1120  Post Infusion IV Care: Peripheral IV Discontinued  Discharge: Condition: Good, Destination: Home . AVS provided to patient.   Performed by:  Pepper Kerrick, Sherlon Handing, LPN

## 2021-07-28 NOTE — Progress Notes (Signed)
BP 112/73    Pulse (!) 108    Temp 98.1 F (36.7 C) (Oral)    Ht 4' 11.2" (1.504 m)    Wt 138 lb 3.2 oz (62.7 kg)    LMP  (LMP Unknown)    SpO2 97%    BMI 27.72 kg/m    Subjective:    Patient ID: Amy Hansen, female    DOB: 12/22/1955, 65 y.o.   MRN: [id number]  HPI: Amy Hansen is a 65 y.o. female  Chief Complaint  Patient presents with   Depression   Diabetes    Eye exam requested from Dr. Gloriann Loan    Hyperlipidemia   Hypertension   DIABETES Patient states she has had several episodes where she has woken up and her sugars are below 60. Hypoglycemic episodes: yes with some dizziness Polydipsia/polyuria: no Visual disturbance: no Chest pain: no Paresthesias yes Glucose Monitoring: No  Accucheck frequency: Not checking  Fasting glucose:   Post prandial:  Evening:  Before meals: Taking Insulin?: no  Long acting insulin:   Short acting insulin: Blood Pressure Monitoring: not checking  CHRON'S  Patient states her infusion of entivio for her chron's disease.  She had an infusion last week.  She hasn't had any flares since being on the medication.  She is on an 8 week cycle.  MOOD Patient states she is doing fair.  Feels like it is more anxiety related due to her home situation.  Does not feel like medications will change that.  Denies SI.  [place] Office Visit from 08/01/2021 in [place]  PHQ-9 Total Score 9      GAD 7 : Generalized Anxiety Score 08/01/2021 05/06/2020  Nervous, Anxious, on Edge 3 3  Control/stop worrying 3 3  Worry too much - different things 3 3  Trouble relaxing 3 2  Restless 3 0  Easily annoyed or irritable 1 2  Afraid - awful might happen 1 3  Total GAD 7 Score 17 16  Anxiety Difficulty Somewhat difficult Not difficult at all        Relevant past medical, surgical, family and social history reviewed and updated as indicated. Interim medical history since our last visit reviewed. Allergies and medications reviewed and  updated.  Review of Systems  Eyes:  Negative for visual disturbance.  Cardiovascular:  Negative for chest pain.  Endocrine: Negative for polydipsia and polyuria.  Neurological:  Positive for dizziness. Negative for light-headedness and numbness.  Psychiatric/Behavioral:  Positive for dysphoric mood. Negative for suicidal ideas. The patient is nervous/anxious.    Per HPI unless specifically indicated above     Objective:    BP 112/73    Pulse (!) 108    Temp 98.1 F (36.7 C) (Oral)    Ht 4' 11.2" (1.504 m)    Wt 138 lb 3.2 oz (62.7 kg)    LMP  (LMP Unknown)    SpO2 97%    BMI 27.72 kg/m   Wt Readings from Last 3 Encounters:  08/01/21 138 lb 3.2 oz (62.7 kg)  07/27/21 144 lb 3.2 oz (65.4 kg)  06/07/21 139 lb (63 kg)    Physical Exam Vitals and nursing note reviewed.  Constitutional:      General: She is not in acute distress.    Appearance: Normal appearance. She is normal weight. She is not ill-appearing, toxic-appearing or diaphoretic.  HENT:     Head: Normocephalic.     Right Ear: External ear normal.  Left Ear: External ear normal.     Nose: Nose normal.     Mouth/Throat:     Mouth: Mucous membranes are moist.     Pharynx: Oropharynx is clear.  Eyes:     General:        Right eye: No discharge.        Left eye: No discharge.     Extraocular Movements: Extraocular movements intact.     Conjunctiva/sclera: Conjunctivae normal.     Pupils: Pupils are equal, round, and reactive to light.  Cardiovascular:     Rate and Rhythm: Normal rate and regular rhythm.     Heart sounds: No murmur heard. Pulmonary:     Effort: Pulmonary effort is normal. No respiratory distress.     Breath sounds: Normal breath sounds. No wheezing or rales.  Musculoskeletal:     Cervical back: Normal range of motion and neck supple.  Skin:    General: Skin is warm and dry.     Capillary Refill: Capillary refill takes less than 2 seconds.  Neurological:     General: No focal deficit present.      Mental Status: She is alert and oriented to person, place, and time. Mental status is at baseline.  Psychiatric:        Mood and Affect: Mood normal.        Behavior: Behavior normal.        Thought Content: Thought content normal.        Judgment: Judgment normal.    Results for orders placed or performed in visit on 05/19/21  Hepatic function panel  Result Value Ref Range   Total Bilirubin 0.2 0.2 - 1.2 mg/dL   Bilirubin, Direct 0.0 0.0 - 0.3 mg/dL   Alkaline Phosphatase 98 39 - 117 U/L   AST 41 (H) 0 - 37 U/L   ALT 45 (H) 0 - 35 U/L   Total Protein 6.9 6.0 - 8.3 g/dL   Albumin 4.0 3.5 - 5.2 g/dL      Assessment & Plan:   Problem List Items Addressed This Visit       Endocrine   Type 2 diabetes mellitus ([place]) - Primary    Chronic.  Controlled.  Continue with current medication regimen.  Having some low blood sugars. Will check lab work today and decrease diabetic medications if needed.  Will stop Glipizide first if A1c is well controlled.  Return to clinic in 3 months for reevaluation.  Call sooner if concerns arise.        Relevant Orders   HgB A1c   Comp Met (CMET)   Lipid Profile     Other   Iron deficiency anemia    Labs ordered today. Will make recommendations based on lab results.       Relevant Orders   CBC w/Diff   Major depression, chronic    Chronic. Ongoing. Not well controlled at this time.  Does not want to change medications. Will continue to assess at future visits. Call sooner if concerns arise. Follow up in 3 months.        Follow up plan: Return in about 3 months (around 10/30/2021) for HTN, HLD, DM2 FU.

## 2021-08-01 ENCOUNTER — Ambulatory Visit (INDEPENDENT_AMBULATORY_CARE_PROVIDER_SITE_OTHER): Payer: BC Managed Care – PPO | Admitting: Nurse Practitioner

## 2021-08-01 ENCOUNTER — Encounter: Payer: Self-pay | Admitting: Nurse Practitioner

## 2021-08-01 ENCOUNTER — Other Ambulatory Visit: Payer: Self-pay

## 2021-08-01 VITALS — BP 112/73 | HR 108 | Temp 98.1°F | Ht 59.2 in | Wt 138.2 lb

## 2021-08-01 DIAGNOSIS — D509 Iron deficiency anemia, unspecified: Secondary | ICD-10-CM

## 2021-08-01 DIAGNOSIS — E119 Type 2 diabetes mellitus without complications: Secondary | ICD-10-CM

## 2021-08-01 DIAGNOSIS — F329 Major depressive disorder, single episode, unspecified: Secondary | ICD-10-CM | POA: Diagnosis not present

## 2021-08-01 MED ORDER — NYSTATIN 100000 UNIT/GM EX POWD: 1.0000 "application " | Freq: Three times a day (TID) | CUTANEOUS | 1 refills | Status: DC

## 2021-08-01 NOTE — Assessment & Plan Note (Signed)
Chronic. Ongoing. Not well controlled at this time.  Does not want to change medications. Will continue to assess at future visits. Call sooner if concerns arise. Follow up in 3 months.

## 2021-08-01 NOTE — Assessment & Plan Note (Signed)
Chronic.  Controlled.  Continue with current medication regimen.  Having some low blood sugars. Will check lab work today and decrease diabetic medications if needed.  Will stop Glipizide first if A1c is well controlled.  Return to clinic in 3 months for reevaluation.  Call sooner if concerns arise.

## 2021-08-01 NOTE — Assessment & Plan Note (Signed)
Labs ordered today.  Will make recommendations based on lab results. ?

## 2021-08-02 ENCOUNTER — Encounter: Payer: Self-pay | Admitting: Physician Assistant

## 2021-08-02 ENCOUNTER — Ambulatory Visit (INDEPENDENT_AMBULATORY_CARE_PROVIDER_SITE_OTHER): Payer: BC Managed Care – PPO | Admitting: Physician Assistant

## 2021-08-02 DIAGNOSIS — F319 Bipolar disorder, unspecified: Secondary | ICD-10-CM | POA: Diagnosis not present

## 2021-08-02 DIAGNOSIS — F411 Generalized anxiety disorder: Secondary | ICD-10-CM | POA: Diagnosis not present

## 2021-08-02 DIAGNOSIS — G2581 Restless legs syndrome: Secondary | ICD-10-CM

## 2021-08-02 DIAGNOSIS — G4700 Insomnia, unspecified: Secondary | ICD-10-CM

## 2021-08-02 LAB — CBC WITH DIFFERENTIAL/PLATELET
Basophils Absolute: 0 10*3/uL (ref 0.0–0.2)
Basos: 1 %
EOS (ABSOLUTE): 0 10*3/uL (ref 0.0–0.4)
Eos: 1 %
Hematocrit: 38.8 % (ref 34.0–46.6)
Hemoglobin: 12.9 g/dL (ref 11.1–15.9)
Immature Grans (Abs): 0 10*3/uL (ref 0.0–0.1)
Immature Granulocytes: 0 %
Lymphocytes Absolute: 1.9 10*3/uL (ref 0.7–3.1)
Lymphs: 44 %
MCH: 30.3 pg (ref 26.6–33.0)
MCHC: 33.2 g/dL (ref 31.5–35.7)
MCV: 91 fL (ref 79–97)
Monocytes Absolute: 0.3 10*3/uL (ref 0.1–0.9)
Monocytes: 6 %
Neutrophils Absolute: 2.1 10*3/uL (ref 1.4–7.0)
Neutrophils: 48 %
Platelets: 369 10*3/uL (ref 150–450)
RBC: 4.26 x10E6/uL (ref 3.77–5.28)
RDW: 14.2 % (ref 11.7–15.4)
WBC: 4.4 10*3/uL (ref 3.4–10.8)

## 2021-08-02 LAB — LIPID PANEL
Chol/HDL Ratio: 3.9 ratio (ref 0.0–4.4)
Cholesterol, Total: 143 mg/dL (ref 100–199)
HDL: 37 mg/dL — ABNORMAL LOW (ref >39)
LDL Chol Calc (NIH): 68 mg/dL (ref 0–99)
Triglycerides: 234 mg/dL — ABNORMAL HIGH (ref 0–149)
VLDL Cholesterol Cal: 38 mg/dL (ref 5–40)

## 2021-08-02 LAB — COMPREHENSIVE METABOLIC PANEL
ALT: 22 IU/L (ref 0–32)
AST: 22 IU/L (ref 0–40)
Albumin/Globulin Ratio: 1.5 (ref 1.2–2.2)
Albumin: 4 g/dL (ref 3.8–4.8)
Alkaline Phosphatase: 137 IU/L — ABNORMAL HIGH (ref 44–121)
BUN/Creatinine Ratio: 10 — ABNORMAL LOW (ref 12–28)
BUN: 8 mg/dL (ref 8–27)
Bilirubin Total: <0.2 mg/dL (ref 0.0–1.2)
CO2: 21 mmol/L (ref 20–29)
Calcium: 9 mg/dL (ref 8.7–10.3)
Chloride: 96 mmol/L (ref 96–106)
Creatinine, Ser: 0.82 mg/dL (ref 0.57–1.00)
Globulin, Total: 2.6 g/dL (ref 1.5–4.5)
Glucose: 129 mg/dL — ABNORMAL HIGH (ref 70–99)
Potassium: 4.5 mmol/L (ref 3.5–5.2)
Sodium: 136 mmol/L (ref 134–144)
Total Protein: 6.6 g/dL (ref 6.0–8.5)
eGFR: 80 mL/min/{1.73_m2} (ref >59)

## 2021-08-02 LAB — HEMOGLOBIN A1C
Est. average glucose Bld gHb Est-mCnc: 134 mg/dL
Hgb A1c MFr Bld: 6.3 % — ABNORMAL HIGH (ref 4.8–5.6)

## 2021-08-02 MED ORDER — QUETIAPINE FUMARATE 25 MG PO TABS: 50.0000 mg | ORAL_TABLET | Freq: Every day | ORAL | 0 refills | Status: DC

## 2021-08-02 MED ORDER — SERTRALINE HCL 100 MG PO TABS: 200.0000 mg | ORAL_TABLET | Freq: Every day | ORAL | 11 refills | Status: DC

## 2021-08-02 NOTE — Progress Notes (Signed)
[facility]  Patient ID: Amy Hansen,  MRN: [id number]  PCP: [name], NP  Date of Evaluation: 08/02/2021.   Time spent:30 minutes  Chief Complaint:  Chief Complaint   Anxiety; Depression; Follow-up       HISTORY/CURRENT STATUS: For 6 week med check.  She called since LOV stating that the Seroquel was too sedating.  She has decreased back to 50 mg and is doing well as far as sleep goes.  She is not groggy during the day.  She sleeps all through the night except for 1 time when she gets up to go to the bathroom.  Now she is frustrated because she cannot stay awake to do her devotion which she used to do during the middle of the night when she could not go to sleep.  Since going off the Risperdal she has had no negative changes in her mood. Patient denies loss of interest in usual activities and is able to enjoy things.  Denies decreased energy or motivation.  Appetite has not changed.  No extreme sadness, tearfulness, or feelings of hopelessness.  Denies any changes in concentration, making decisions or remembering things.  Denies suicidal or homicidal thoughts.  Patient denies increased energy with decreased need for sleep, no increased talkativeness, no racing thoughts, no impulsivity or risky behaviors, no increased spending, no increased libido, no grandiosity, no increased irritability or anger, and no hallucinations.  Still complains of a lot of anxiety.  Xanax does help.  If she does not take it fairly regularly then she will have panic attacks.  Otherwise she feels a sense of dread like something bad is about to happen.  States she has a tremor in her hands bilaterally, not sure how long it has been going on.  Her daughters and her husband have mentioned it to her recently.  She has not really noticed it until they brought it up.  It does affect her handwriting but otherwise no problems.  Denies dizziness, syncope, seizures, numbness, tingling, tics,  unsteady gait, slurred speech, confusion. Denies muscle or joint pain, stiffness, or dystonia.  Individual Medical History/ Review of Systems: Changes? :No     Past medications for mental health diagnoses include: Trazodone, Risperdal, Zoloft, Lunesta, prazosin, Sonata, Prozac, Depakote, Lamictal, lithium, Wellbutrin, Xanax, Ambien, carbamazepine, Seroquel   Allergies: Avelox [moxifloxacin hcl in nacl], Bactrim [sulfamethoxazole-trimethoprim], Ciprofloxacin, Depakote [divalproex sodium], Imitrex [sumatriptan], and Stadol [butorphanol]  Current Medications:  Current Outpatient Medications:    ALPRAZolam (XANAX) 1 MG tablet, Take 1 tablet (1 mg total) by mouth 3 (three) times daily as needed for anxiety., Disp: 90 tablet, Rfl: 5   buPROPion (WELLBUTRIN XL) 150 MG 24 hr tablet, Take 3 tablets by mouth once daily, Disp: 270 tablet, Rfl: 3   dicyclomine (BENTYL) 10 MG capsule, Take 1 capsule (10 mg total) by mouth 3 (three) times daily as needed for spasms., Disp: 90 capsule, Rfl: 4   Dulaglutide (TRULICITY) 2.35 TD/3.2KG SOPN, Inject 0.75 mg into the skin once a week., Disp: 6 mL, Rfl: 0   furosemide (LASIX) 20 MG tablet, Take 1 tablet (20 mg total) by mouth daily. May take an additional tablet (7m) AS NEEDED for weight gain and/or swelling., Disp: 90 tablet, Rfl: 3   gabapentin (NEURONTIN) 100 MG capsule, Take 100 mg by mouth 2 (two) times daily as needed., Disp: , Rfl:    glipiZIDE (GLUCOTROL) 5 MG tablet, Take 1 tablet by mouth twice daily., Disp: 180 tablet, Rfl: 1   lamoTRIgine (LAMICTAL)  200 MG tablet, Take 2 tablets (400 mg total) by mouth at bedtime., Disp: 60 tablet, Rfl: 11   metFORMIN (GLUCOPHAGE) 1000 MG tablet, Take 1 tablet by mouth twice daily., Disp: 180 tablet, Rfl: 1   ondansetron (ZOFRAN-ODT) 4 MG disintegrating tablet, Take 1 tablet (4 mg total) by mouth every 6 (six) hours as needed for nausea., Disp: 30 tablet, Rfl: 1   pramipexole (MIRAPEX) 1 MG tablet, Take 1 tablet (1 mg  total) by mouth at bedtime., Disp: 90 tablet, Rfl: 0   vedolizumab (ENTYVIO) 300 MG injection, Inject into the vein every 8 (eight) weeks., Disp: , Rfl:    nystatin (MYCOSTATIN/NYSTOP) powder, Apply 1 application topically 3 (three) times daily. (Patient not taking: Reported on 08/02/2021), Disp: 60 g, Rfl: 1   QUEtiapine (SEROQUEL) 25 MG tablet, Take 2 tablets (50 mg total) by mouth at bedtime., Disp: 180 tablet, Rfl: 0   sertraline (ZOLOFT) 100 MG tablet, Take 2 tablets (200 mg total) by mouth daily., Disp: 60 tablet, Rfl: 11 Medication Side Effects: none  Family Medical/ Social History: Changes?  No  MENTAL HEALTH EXAM:  There were no vitals taken for this visit.There is no height or weight on file to calculate BMI.  General Appearance: Casual, Neat, and Well Groomed  Eye Contact:  Good  Speech:  Clear and Coherent and Normal Rate  Volume:  Normal  Mood:  Euthymic  Affect:  Appropriate  Thought Process:  Goal Directed and Descriptions of Associations: Intact  Orientation:  Full (Time, Place, and Person)  Thought Content: Logical   Suicidal Thoughts:  No  Homicidal Thoughts:  No  Memory:  WNL  Judgement:  Good  Insight:  Good  Psychomotor Activity:   Very mild fine motor tremor in hands of outstretched arms, left greater than right.  Concentration:  Concentration: Good and Attention Span: Good  Recall:  Good  Fund of Knowledge: Good  Language: Good  Assets:  Desire for Improvement Financial Resources/Insurance Housing Social Support Transportation  ADL's:  Intact  Cognition: WNL  Prognosis:  Good     DIAGNOSES:    ICD-10-CM   1. Bipolar I disorder ([place])  F31.9     2. Insomnia, unspecified type  G47.00     3. Restless legs syndrome (RLS)  G25.81     4. Generalized anxiety disorder  F41.1         Receiving Psychotherapy: No    RECOMMENDATIONS:  PDMP reviewed.  Last Xanax was filled 07/28/2021 I provided 30 minutes of face to face time during this encounter,  including time spent before and after the visit in records review, medical decision making, counseling pertinent to today's visit, and charting.  We discussed the tremor.  I think since changing Risperdal to Seroquel and then decreasing the dose of Seroquel should help with the tremor.  So I would like to watch it but she knows to contact me if it worsens or does not go away.  What I see today is not really significant.  She agrees that it is not bad today. We will keep the Seroquel at a low dose for now.  Mood wise she is stable and this dose is not causing too much sedation.  She understands that the antipsychotic is at what would be considered a subtherapeutic dose and if she starts having any symptoms of depression or mania she should call immediately and I will either increase the dose again or change back to Risperdal. Continue Seroquel 25 mg, po 2  qhs.  Continue Xanax 1 mg, 1 po tid prn. Or ok to take 1/2 pill, 6 times per day. Not over 3 pills per day. Continue Wellbutrin XL 150 mg, 3 p.o. daily. Continue Lamictal 200 mg, 1 p.o. twice daily. Continue Mirapex 1 mg p.o. nightly.  PCP prescribes. Continue Zoloft 100 mg, 2 p.o. daily. Continue Gabapentin 100 mg, 1 po bid prn.  Recommend counseling. Return in 4 to 6 weeks.  Donnal Moat, PA-C

## 2021-08-02 NOTE — Progress Notes (Signed)
Hi Amy Hansen.  As expected your A1c came down to 6.3 which is great news.  I recommend you stop the Glipizide.  We will recheck in 3 months and if it is still well controlled we will continue to wean off other medications.  Your other lab work looks good. I will see you at our next visit.

## 2021-08-08 ENCOUNTER — Telehealth: Payer: BC Managed Care – PPO

## 2021-08-08 ENCOUNTER — Ambulatory Visit: Payer: Self-pay

## 2021-08-08 DIAGNOSIS — M25562 Pain in left knee: Secondary | ICD-10-CM

## 2021-08-08 DIAGNOSIS — E119 Type 2 diabetes mellitus without complications: Secondary | ICD-10-CM

## 2021-08-08 DIAGNOSIS — K529 Noninfective gastroenteritis and colitis, unspecified: Secondary | ICD-10-CM

## 2021-08-08 DIAGNOSIS — F319 Bipolar disorder, unspecified: Secondary | ICD-10-CM

## 2021-08-08 DIAGNOSIS — F329 Major depressive disorder, single episode, unspecified: Secondary | ICD-10-CM

## 2021-08-08 DIAGNOSIS — R296 Repeated falls: Secondary | ICD-10-CM

## 2021-08-08 DIAGNOSIS — F419 Anxiety disorder, unspecified: Secondary | ICD-10-CM

## 2021-08-08 DIAGNOSIS — I10 Essential (primary) hypertension: Secondary | ICD-10-CM

## 2021-08-08 DIAGNOSIS — K5100 Ulcerative (chronic) pancolitis without complications: Secondary | ICD-10-CM

## 2021-08-08 DIAGNOSIS — G8929 Other chronic pain: Secondary | ICD-10-CM

## 2021-08-08 NOTE — Patient Instructions (Signed)
Visit Information  Thank you for taking time to visit with me today. Please don't hesitate to contact me if I can be of assistance to you before our next scheduled telephone appointment.  Following are the goals we discussed today:  RNCM Clinical Goal(s):  Patient will verbalize understanding of plan for management of HTN, DMII, Anxiety, Depression, Bipolar Disorder, and colitis, chron's, chronic pain, and falls prevention  as evidenced by no new falls, stable pain, stable chronic conditions, checking blood sugars as directed and calling the office for changes in conditions take all medications exactly as prescribed and will call provider for medication related questions as evidenced by compliance with medications and calling for refills before running out of medications     attend all scheduled medical appointments: 10-30-2021 at 0900 am as evidenced by keeping appointments and calling for schedule changes needs         demonstrate improved and ongoing health management independence as evidenced by effective management of chronic conditions and working with the CCM team to optimize health and well being        demonstrate a decrease in HTN, DMII, Anxiety, Depression, Bipolar Disorder, and Colitis/Chron's, chronic pain, and falls prevention and safety  exacerbations  as evidenced by compliance with the plan of care and working with the pcp and CCM team to effectively manage chronic conditions  demonstrate ongoing self health care management ability for effective management of chronic conditions as evidenced by working with the CCM team  through collaboration with Consulting civil engineer, provider, and care team.    Interventions: 1:1 collaboration with primary care provider regarding development and update of comprehensive plan of care as evidenced by provider attestation and co-signature Inter-disciplinary care team collaboration (see longitudinal plan of care) Evaluation of current treatment plan related to   self management and patient's adherence to plan as established by provider     Diabetes:  (Status: Goal on Track (progressing): YES.) Long Term Goal         Lab Results  Component Value Date    HGBA1C 6.3 (H) 08/01/2021  Assessed patient's understanding of A1c goal: <7% Provided education to patient about basic DM disease process; Reviewed medications with patient and discussed importance of medication adherence. 08-08-2021: The patient is no longer taking glipizide and may come of of the Trulicity in April. She is having stable blood sugars and denies any lows at this time ;        Reviewed prescribed diet with patient heart healthy/ADA. 08-08-2021: The patient states she still does not have much of an appetite even since the colitis is better but she is maintaining her weight not losing more weight. She states she is monitoring her dietary intake. ; Counseled on importance of regular laboratory monitoring as prescribed. 08-08-2021: Has regular labwork. No new concerns related to labs at this time. Will have new labs in April. Praised the patient for getting A1C level down.        Discussed plans with patient for ongoing care management follow up and provided patient with direct contact information for care management team;      Provided patient with written educational materials related to hypo and hyperglycemia and importance of correct treatment. 06-14-2021: Denies any lows at this time, has not been checking so does not have numbers to provide to [facility]. 08-08-2021: The patient denies any lows at this time. Range has been 87 to 100's;       Reviewed scheduled/upcoming provider appointments including: 10-30-2021 at  0900 am;         Advised patient, providing education and rationale, to check cbg as directed  and record. 06-14-2021: The patient has not been checking blood sugars on a consistent basis. Reviewed the benefits of checking blood sugars and knowing what her levels are. Goals of fasting are  <130 and <180 post prandial. The patient states she has trulicity now and she will start taking blood sugars.  08-08-2021: The patient is checking more consistently now and states her range has been 87 to 100's. The patient is doing well with maintaining her blood sugars since her colitis has stabilized and she is no longer having to take steroids for exacerbations.     call provider for findings outside established parameters;       Review of patient status, including review of consultants reports, relevant laboratory and other test results, and medications completed;       Advised patient to discuss discuss blood sugar trends, and changes in health( the patient has had a recent UTI and toe infection) with provider;        Falls:  (Status: Goal on Track (progressing): YES.) Long Term Goal  Provided written and verbal education re: potential causes of falls and Fall prevention strategies Reviewed medications and discussed potential side effects of medications such as dizziness and frequent urination Advised patient of importance of notifying provider of falls. 06-14-2021: Denies any new falls, is using her cane when ambulating. 08-08-2021: The patient states she had a fall about 2 weeks ago when she was trying to get back in the bed in the middle of the night after she had gone to the bathroom. She states she has a scrap on her elbow that is healing. Denies hitting her head or any other injuries. Review of safety precautions.  Assessed for signs and symptoms of orthostatic hypotension. 08-08-2021: Denies any sx and sx of orthostatic hypotension  Assessed for falls since last encounter. 08-08-2021: Had a fall about "2 weeks ago" Assessed patients knowledge of fall risk prevention secondary to previously provided education Provided patient information for fall alert systems Assessed working status of life alert bracelet and patient adherence Advised patient to discuss new falls and safety concerns with  provider     Depression, Anxiety, Depression   (Status: Goal on Track (progressing): YES.) Long Term Goal  Evaluation of current treatment plan related to Anxiety, Depression, and Bipolar Disorder, Mental Health Concerns  self-management and patient's adherence to plan as established by provider. 08-08-2021: Patient is following up with behavioral health on a regular basis. Has had medication changes and these changes have been beneficial. She is sleeping well at night and not feeling groggy during the day. A tremor was noted by the patients husband and daughter. The patient states that she did not notice it until they said something. She feels like the Seroquel dose is working well for her and she will continue to work with behavioral health for management of mental health. Also has the support of the LCSW and CCM team.  Discussed plans with patient for ongoing care management follow up and provided patient with direct contact information for care management team Advised patient to provide appropriate vaccination information to provider or CM team member at next visit; Advised patient to call the office for changes in mood, anxiety, or depression ; Provided education to patient re: keeping appointments and working with [facility] to maintain mental health and well being. 08-08-2021: Is working with the behavioral health team.  She had a visit on 08-02-2021; Reviewed medications with patient and discussed compliance. 08-08-2021: Is compliant with her medications and denies any new concerns. States the medication changes have been effective; Reviewed scheduled/upcoming provider appointments including 10-30-2021 at 0900 am; Discussed plans with patient for ongoing care management follow up and provided patient with direct contact information for care management team; Advised patient to discuss changes in mental health with provider; Screening for signs and symptoms of depression related to chronic disease state;   Assessed social determinant of health barriers;     Colitis/Chron's  (Status: Goal on Track (progressing): YES.) Long Term Goal  Evaluation of current treatment plan related to  Colitis/Chron's ,  self-management and patient's adherence to plan as established by provider. 08-08-2021: The patient has been getting regular infusions and it has made a profound difference with her Colitis/Chron's. Last infusion was 07-27-2021. The patient states she feels so much better. Still does not have much of an appetite but is thankful for no exacerbations with the need of steroids. Had some dental work today. Denies any new concerns with GI health and wellness. Will continue to monitor.  Discussed plans with patient for ongoing care management follow up and provided patient with direct contact information for care management team Advised patient to call the office for changes in Chron's or Colitis ; Reviewed medications with patient and discussed compliance. The patient has been taking infusions and these are working well for her colitis/Chron's ; Reviewed scheduled/upcoming provider appointments including 10-30-2021 at 0900 am and several upcoming with the specialist ; Discussed plans with patient for ongoing care management follow up and provided patient with direct contact information for care management team; Advised patient to discuss new concerns with her GI health with provider;   Hypertension: (Status: Goal on Track (progressing): YES.) Last practice recorded BP readings:     BP Readings from Last 3 Encounters:  08/01/21 112/73  07/27/21 115/70  06/07/21 120/86  Most recent eGFR/CrCl:       Lab Results  Component Value Date    EGFR 80 08/01/2021    No components found for: CRCL   Evaluation of current treatment plan related to hypertension self management and patient's adherence to plan as established by provider. 08-08-2021: The patient is having stable blood pressures and denies any light  headedness or dizziness. The patient denies any orthostatic hypotension   Provided education to patient re: stroke prevention, s/s of heart attack and stroke; Reviewed prescribed diet heart healthy. 08-08-2021: Review of heart healthy/ADA diet  Reviewed medications with patient and discussed importance of compliance. 08-08-2021 Is compliant with medications;  Discussed plans with patient for ongoing care management follow up and provided patient with direct contact information for care management team; Advised patient, providing education and rationale, to monitor blood pressure daily and record, calling PCP for findings outside established parameters;  Provided education on prescribed diet heart healthy ;  Discussed complications of poorly controlled blood pressure such as heart disease, stroke, circulatory complications, vision complications, kidney impairment, sexual dysfunction;    Pain:  (Status: Goal on track: NO.) Long Term Goal  Pain assessment performed. 06-14-2021: the patient rates her pain at an 8 today. States that she has had several appointments and it is worse today. The patient states that she is waiting for approval to have the "gel injections". She is taking PT. If the injections do not work she is looking at Knee replacement surgery. 08-08-2021: The patient had 3 injection sin her knee and  she currently has no pain. She can get these injections every 6 months if needed. She is thankful the shots are working well for her because she did not want to have a knee replacement.  Medications reviewed. 08-08-2021: Had a series of 3 injections. States this has completely eliminated her pain in her knee Reviewed provider established plan for pain management; Discussed importance of adherence to all scheduled medical appointments; Counseled on the importance of reporting any/all new or changed pain symptoms or management strategies to pain management provider; Advised patient to report to care  team affect of pain on daily activities; Discussed use of relaxation techniques and/or diversional activities to assist with pain reduction (distraction, imagery, relaxation, massage, acupressure, TENS, heat, and cold application; Reviewed with patient prescribed pharmacological and nonpharmacological pain relief strategies; Advised patient to discuss uncontrolled pain, changes in level or intensity of pain  with provider;   Patient Goals/Self-Care Activities: Patient will self administer medications as prescribed as evidenced by self report/primary caregiver report  Patient will attend all scheduled provider appointments as evidenced by clinician review of documented attendance to scheduled appointments and patient/caregiver report Patient will call pharmacy for medication refills as evidenced by patient report and review of pharmacy fill history as appropriate Patient will attend church or other social activities as evidenced by patient report Patient will continue to perform ADL's independently as evidenced by patient/caregiver report Patient will continue to perform IADL's independently as evidenced by patient/caregiver report Patient will call provider office for new concerns or questions as evidenced by review of documented incoming telephone call notes and patient report Patient will work with BSW to address care coordination needs and will continue to work with the clinical team to address health care and disease management related needs as evidenced by documented adherence to scheduled care management/care coordination appointments keep appointment with eye doctor check blood sugar at prescribed times: before meals and at bedtime, when you have symptoms of low or high blood sugar, before and after exercise, and as directed by the pcp, the patient has not been taking blood sugars, explained the benefits of checking blood sugars on a consistent basis  check feet daily for cuts, sores or  redness enter blood sugar readings and medication or insulin into daily log take the blood sugar log to all doctor visits take the blood sugar meter to all doctor visits trim toenails straight across drink 6 to 8 glasses of water each day eat fish at least once per week fill half of plate with vegetables keep a food diary limit fast food meals to no more than 1 per week manage portion size prepare main meal at home 3 to 5 days each week read food labels for fat, fiber, carbohydrates and portion size reduce red meat to 2 to 3 times a week keep feet up while sitting wash and dry feet carefully every day wear comfortable, cotton socks wear comfortable, well-fitting shoes - check blood pressure weekly - choose a place to take my blood pressure (home, clinic or office, retail store) - write blood pressure results in a log or diary - learn about high blood pressure - keep a blood pressure log - take blood pressure log to all doctor appointments - call doctor for signs and symptoms of high blood pressure - develop an action plan for high blood pressure - keep all doctor appointments - take medications for blood pressure exactly as prescribed - report new symptoms to your doctor - eat more whole grains, fruits  and vegetables, lean meats and healthy fats    Our next appointment is by telephone on 10-10-2021  at 1 pm  Please call the care guide team at [phone] if you need to cancel or reschedule your appointment.   If you are experiencing a Mental Health or [place] or need someone to talk to, please call the Suicide and Crisis Lifeline: 988 call the [place] National Suicide Prevention Lifeline: (812)853-3429 or TTY: 769-091-4301 TTY (680)856-6094) to talk to a trained counselor call 1-800-273-TALK (toll free, 24 hour hotline)   Patient verbalizes understanding of instructions provided today and agrees to view in [place].   Telephone follow up appointment with care  management team member scheduled for: 10-10-2021 at 1 pm  Noreene Larsson RN, MSN, [facility] Mobile: [phone]

## 2021-08-08 NOTE — Chronic Care Management (AMB) (Signed)
Care Management    RN Visit Note  08/08/2021 Name: Amy Hansen MRN: [id number] DOB: Jun 15, 1956  Subjective: Amy Hansen is a 66 y.o. year old female who is a primary care patient of Amy Billings, NP. The care management team was consulted for assistance with disease management and care coordination needs.    Engaged with patient by telephone for follow up visit in response to provider referral for case management and/or care coordination services.   Consent to Services:   Amy Hansen was given information about Care Management services today including:  Care Management services includes personalized support from designated clinical staff supervised by her physician, including individualized plan of care and coordination with other care providers 24/7 contact phone numbers for assistance for urgent and routine care needs. The patient may stop case management services at any time by phone call to the office staff.  Patient agreed to services and consent obtained.   Assessment: Review of patient past medical history, allergies, medications, health status, including review of consultants reports, laboratory and other test data, was performed as part of comprehensive evaluation and provision of chronic care management services.   SDOH (Social Determinants of Health) assessments and interventions performed:    Care Plan  Allergies  Allergen Reactions   Avelox [Moxifloxacin Hcl In Nacl] Anaphylaxis   Bactrim [Sulfamethoxazole-Trimethoprim] Anaphylaxis   Ciprofloxacin Other (See Comments)    Pt states she was told never to take this as it is in the same family as Avelox.    Depakote [Divalproex Sodium] Other (See Comments)    Unknown Reaction   Imitrex [Sumatriptan] Other (See Comments)    Neck and shoulder pain   Stadol [Butorphanol] Rash    Outpatient Encounter Medications as of 08/08/2021  Medication Sig   ALPRAZolam (XANAX) 1 MG tablet Take 1 tablet (1 mg total) by mouth  3 (three) times daily as needed for anxiety.   buPROPion (WELLBUTRIN XL) 150 MG 24 hr tablet Take 3 tablets by mouth once daily   dicyclomine (BENTYL) 10 MG capsule Take 1 capsule (10 mg total) by mouth 3 (three) times daily as needed for spasms.   Dulaglutide (TRULICITY) 0.25 EN/2.7PO SOPN Inject 0.75 mg into the skin once a week.   furosemide (LASIX) 20 MG tablet Take 1 tablet (20 mg total) by mouth daily. May take an additional tablet (39m) AS NEEDED for weight gain and/or swelling.   gabapentin (NEURONTIN) 100 MG capsule Take 100 mg by mouth 2 (two) times daily as needed.   glipiZIDE (GLUCOTROL) 5 MG tablet Take 1 tablet by mouth twice daily.   lamoTRIgine (LAMICTAL) 200 MG tablet Take 2 tablets (400 mg total) by mouth at bedtime.   metFORMIN (GLUCOPHAGE) 1000 MG tablet Take 1 tablet by mouth twice daily.   nystatin (MYCOSTATIN/NYSTOP) powder Apply 1 application topically 3 (three) times daily. (Patient not taking: Reported on 08/02/2021)   ondansetron (ZOFRAN-ODT) 4 MG disintegrating tablet Take 1 tablet (4 mg total) by mouth every 6 (six) hours as needed for nausea.   pramipexole (MIRAPEX) 1 MG tablet Take 1 tablet (1 mg total) by mouth at bedtime.   QUEtiapine (SEROQUEL) 25 MG tablet Take 2 tablets (50 mg total) by mouth at bedtime.   sertraline (ZOLOFT) 100 MG tablet Take 2 tablets (200 mg total) by mouth daily.   vedolizumab (ENTYVIO) 300 MG injection Inject into the vein every 8 (eight) weeks.   No facility-administered encounter medications on file as of 08/08/2021.    Patient Active  Problem List   Diagnosis Date Noted   TIA (transient ischemic attack) 02/20/2021   Protein-calorie malnutrition, severe 02/09/2021   Abnormal CT scan, gastrointestinal tract    BRBPR (bright red blood per rectum) 05/01/2020   Weakness 04/25/2020   Pancolitis ([place]) 04/25/2020   Type 2 diabetes mellitus ([place]) 04/25/2020   Colitis 04/21/2020   AKI (acute kidney injury) ([place]) 04/21/2020   Major  depression, chronic 06/01/2018   OCD (obsessive compulsive disorder) 06/01/2018   Chronic heart failure with preserved ejection fraction ([place]) 05/16/2018   Left leg pain 11/04/2017   GERD (gastroesophageal reflux disease) 02/18/2017   Iron deficiency anemia 10/18/2016   Leg swelling 07/17/2016   Hypertension 03/21/2016   Vitamin D deficiency 02/21/2016   B12 deficiency 02/21/2016   Poorly controlled type 2 diabetes mellitus ([place]) 09/13/2015   Migraines 05/23/2015   Restless legs syndrome (RLS) 05/23/2015   Recurrent UTI 04/26/2015   Atrophic vaginitis 04/26/2015   Dyspareunia, female 04/26/2015    Conditions to be addressed/monitored: HTN, DMII, Anxiety, Depression, Bipolar Disorder, and Colitis, Chrons, chronic pain and falls   Care Plan : RNCM: General Plan of Care (Adult) for Chronic Disease Management and Care Coordination Needs  Updates made by Amy Ingles, RN since 08/08/2021 12:00 AM     Problem: RNCM: Development of Plan of Care for Chronic Disease Management (DM, HTN, Colitis, Chrons, anxiety, depression, Bipolar, chronic pain, Falls)   Priority: High     Long-Range Goal: RNCM: Effective Management of Plan of Care for Chronic Disease Management (DM, HTN, Colitis, Chrons, anxiety, depression, Bipolar, chronic pain, Falls)   Start Date: 08/14/2020  Expected End Date: 08/14/2021  Priority: High  Note:   Current Barriers:  Knowledge Deficits related to plan of care for management of HTN, DMII, Anxiety with Excessive Worry,, Depression: depressed mood anxiety disturbed sleep, and Bipolar Disorder, and Colitis/Chron's, chronic pain, falls prevention and safety  Chronic Disease Management support and education needs related to HTN, DMII, Anxiety with Excessive Worry,, Depression: depressed mood anxiety insomnia, Bipolar Disorder, and Insomnia/Sleep Difficulties, and Colitis, chron's, chronic pain, and falls and safety  RNCM Clinical Goal(s):  Patient will verbalize  understanding of plan for management of HTN, DMII, Anxiety, Depression, Bipolar Disorder, and colitis, chron's, chronic pain, and falls prevention  as evidenced by no new falls, stable pain, stable chronic conditions, checking blood sugars as directed and calling the office for changes in conditions take all medications exactly as prescribed and will call provider for medication related questions as evidenced by compliance with medications and calling for refills before running out of medications     attend all scheduled medical appointments: 10-30-2021 at 0900 am as evidenced by keeping appointments and calling for schedule changes needs         demonstrate improved and ongoing health management independence as evidenced by effective management of chronic conditions and working with the CCM team to optimize health and well being        demonstrate a decrease in HTN, DMII, Anxiety, Depression, Bipolar Disorder, and Colitis/Chron's, chronic pain, and falls prevention and safety  exacerbations  as evidenced by compliance with the plan of care and working with the pcp and CCM team to effectively manage chronic conditions  demonstrate ongoing self health care management ability for effective management of chronic conditions as evidenced by working with the CCM team  through collaboration with Consulting civil engineer, provider, and care team.   Interventions: 1:1 collaboration with primary care provider regarding development and update  of comprehensive plan of care as evidenced by provider attestation and co-signature Inter-disciplinary care team collaboration (see longitudinal plan of care) Evaluation of current treatment plan related to  self management and patient's adherence to plan as established by provider   Diabetes:  (Status: Goal on Track (progressing): YES.) Long Term Goal   Lab Results  Component Value Date   HGBA1C 6.3 (H) 08/01/2021  Assessed patient's understanding of A1c goal: <7% Provided  education to patient about basic DM disease process; Reviewed medications with patient and discussed importance of medication adherence. 08-08-2021: The patient is no longer taking glipizide and may come of of the Trulicity in April. She is having stable blood sugars and denies any lows at this time ;        Reviewed prescribed diet with patient heart healthy/ADA. 08-08-2021: The patient states she still does not have much of an appetite even since the colitis is better but she is maintaining her weight not losing more weight. She states she is monitoring her dietary intake. ; Counseled on importance of regular laboratory monitoring as prescribed. 08-08-2021: Has regular labwork. No new concerns related to labs at this time. Will have new labs in April. Praised the patient for getting A1C level down.        Discussed plans with patient for ongoing care management follow up and provided patient with direct contact information for care management team;      Provided patient with written educational materials related to hypo and hyperglycemia and importance of correct treatment. 06-14-2021: Denies any lows at this time, has not been checking so does not have numbers to provide to [facility]. 08-08-2021: The patient denies any lows at this time. Range has been 87 to 100's;       Reviewed scheduled/upcoming provider appointments including: 10-30-2021 at 0900 am;         Advised patient, providing education and rationale, to check cbg as directed  and record. 06-14-2021: The patient has not been checking blood sugars on a consistent basis. Reviewed the benefits of checking blood sugars and knowing what her levels are. Goals of fasting are <130 and <180 post prandial. The patient states she has trulicity now and she will start taking blood sugars.  08-08-2021: The patient is checking more consistently now and states her range has been 87 to 100's. The patient is doing well with maintaining her blood sugars since her colitis has  stabilized and she is no longer having to take steroids for exacerbations.     call provider for findings outside established parameters;       Review of patient status, including review of consultants reports, relevant laboratory and other test results, and medications completed;       Advised patient to discuss discuss blood sugar trends, and changes in health( the patient has had a recent UTI and toe infection) with provider;       Falls:  (Status: Goal on Track (progressing): YES.) Long Term Goal  Provided written and verbal education re: potential causes of falls and Fall prevention strategies Reviewed medications and discussed potential side effects of medications such as dizziness and frequent urination Advised patient of importance of notifying provider of falls. 06-14-2021: Denies any new falls, is using her cane when ambulating. 08-08-2021: The patient states she had a fall about 2 weeks ago when she was trying to get back in the bed in the middle of the night after she had gone to the bathroom. She states she has  a scrap on her elbow that is healing. Denies hitting her head or any other injuries. Review of safety precautions.  Assessed for signs and symptoms of orthostatic hypotension. 08-08-2021: Denies any sx and sx of orthostatic hypotension  Assessed for falls since last encounter. 08-08-2021: Had a fall about "2 weeks ago" Assessed patients knowledge of fall risk prevention secondary to previously provided education Provided patient information for fall alert systems Assessed working status of life alert bracelet and patient adherence Advised patient to discuss new falls and safety concerns with provider   Depression, Anxiety, Depression   (Status: Goal on Track (progressing): YES.) Long Term Goal  Evaluation of current treatment plan related to Anxiety, Depression, and Bipolar Disorder, Mental Health Concerns  self-management and patient's adherence to plan as established by provider.  08-08-2021: Patient is following up with behavioral health on a regular basis. Has had medication changes and these changes have been beneficial. She is sleeping well at night and not feeling groggy during the day. A tremor was noted by the patients husband and daughter. The patient states that she did not notice it until they said something. She feels like the Seroquel dose is working well for her and she will continue to work with behavioral health for management of mental health. Also has the support of the LCSW and CCM team.  Discussed plans with patient for ongoing care management follow up and provided patient with direct contact information for care management team Advised patient to provide appropriate vaccination information to provider or CM team member at next visit; Advised patient to call the office for changes in mood, anxiety, or depression ; Provided education to patient re: keeping appointments and working with [facility] to maintain mental health and well being. 08-08-2021: Is working with the behavioral health team. She had a visit on 08-02-2021; Reviewed medications with patient and discussed compliance. 08-08-2021: Is compliant with her medications and denies any new concerns. States the medication changes have been effective; Reviewed scheduled/upcoming provider appointments including 10-30-2021 at 0900 am; Discussed plans with patient for ongoing care management follow up and provided patient with direct contact information for care management team; Advised patient to discuss changes in mental health with provider; Screening for signs and symptoms of depression related to chronic disease state;  Assessed social determinant of health barriers;    Colitis/Chron's  (Status: Goal on Track (progressing): YES.) Long Term Goal  Evaluation of current treatment plan related to  Colitis/Chron's ,  self-management and patient's adherence to plan as established by provider. 08-08-2021: The patient has been  getting regular infusions and it has made a profound difference with her Colitis/Chron's. Last infusion was 07-27-2021. The patient states she feels so much better. Still does not have much of an appetite but is thankful for no exacerbations with the need of steroids. Had some dental work today. Denies any new concerns with GI health and wellness. Will continue to monitor.  Discussed plans with patient for ongoing care management follow up and provided patient with direct contact information for care management team Advised patient to call the office for changes in Chron's or Colitis ; Reviewed medications with patient and discussed compliance. The patient has been taking infusions and these are working well for her colitis/Chron's ; Reviewed scheduled/upcoming provider appointments including 10-30-2021 at 0900 am and several upcoming with the specialist ; Discussed plans with patient for ongoing care management follow up and provided patient with direct contact information for care management team; Advised patient to discuss  new concerns with her GI health with provider;  Hypertension: (Status: Goal on Track (progressing): YES.) Last practice recorded BP readings:  BP Readings from Last 3 Encounters:  08/01/21 112/73  07/27/21 115/70  06/07/21 120/86  Most recent eGFR/CrCl:  Lab Results  Component Value Date   EGFR 80 08/01/2021    No components found for: CRCL  Evaluation of current treatment plan related to hypertension self management and patient's adherence to plan as established by provider. 08-08-2021: The patient is having stable blood pressures and denies any light headedness or dizziness. The patient denies any orthostatic hypotension   Provided education to patient re: stroke prevention, s/s of heart attack and stroke; Reviewed prescribed diet heart healthy. 08-08-2021: Review of heart healthy/ADA diet  Reviewed medications with patient and discussed importance of compliance. 08-08-2021  Is compliant with medications;  Discussed plans with patient for ongoing care management follow up and provided patient with direct contact information for care management team; Advised patient, providing education and rationale, to monitor blood pressure daily and record, calling PCP for findings outside established parameters;  Provided education on prescribed diet heart healthy ;  Discussed complications of poorly controlled blood pressure such as heart disease, stroke, circulatory complications, vision complications, kidney impairment, sexual dysfunction;   Pain:  (Status: Goal on track: NO.) Long Term Goal  Pain assessment performed. 06-14-2021: the patient rates her pain at an 8 today. States that she has had several appointments and it is worse today. The patient states that she is waiting for approval to have the "gel injections". She is taking PT. If the injections do not work she is looking at Knee replacement surgery. 08-08-2021: The patient had 3 injection sin her knee and she currently has no pain. She can get these injections every 6 months if needed. She is thankful the shots are working well for her because she did not want to have a knee replacement.  Medications reviewed. 08-08-2021: Had a series of 3 injections. States this has completely eliminated her pain in her knee Reviewed provider established plan for pain management; Discussed importance of adherence to all scheduled medical appointments; Counseled on the importance of reporting any/all new or changed pain symptoms or management strategies to pain management provider; Advised patient to report to care team affect of pain on daily activities; Discussed use of relaxation techniques and/or diversional activities to assist with pain reduction (distraction, imagery, relaxation, massage, acupressure, TENS, heat, and cold application; Reviewed with patient prescribed pharmacological and nonpharmacological pain relief  strategies; Advised patient to discuss uncontrolled pain, changes in level or intensity of pain  with provider;  Patient Goals/Self-Care Activities: Patient will self administer medications as prescribed as evidenced by self report/primary caregiver report  Patient will attend all scheduled provider appointments as evidenced by clinician review of documented attendance to scheduled appointments and patient/caregiver report Patient will call pharmacy for medication refills as evidenced by patient report and review of pharmacy fill history as appropriate Patient will attend church or other social activities as evidenced by patient report Patient will continue to perform ADL's independently as evidenced by patient/caregiver report Patient will continue to perform IADL's independently as evidenced by patient/caregiver report Patient will call provider office for new concerns or questions as evidenced by review of documented incoming telephone call notes and patient report Patient will work with BSW to address care coordination needs and will continue to work with the clinical team to address health care and disease management related needs as evidenced by documented  adherence to scheduled care management/care coordination appointments keep appointment with eye doctor check blood sugar at prescribed times: before meals and at bedtime, when you have symptoms of low or high blood sugar, before and after exercise, and as directed by the pcp, the patient has not been taking blood sugars, explained the benefits of checking blood sugars on a consistent basis  check feet daily for cuts, sores or redness enter blood sugar readings and medication or insulin into daily log take the blood sugar log to all doctor visits take the blood sugar meter to all doctor visits trim toenails straight across drink 6 to 8 glasses of water each day eat fish at least once per week fill half of plate with vegetables keep a  food diary limit fast food meals to no more than 1 per week manage portion size prepare main meal at home 3 to 5 days each week read food labels for fat, fiber, carbohydrates and portion size reduce red meat to 2 to 3 times a week keep feet up while sitting wash and dry feet carefully every day wear comfortable, cotton socks wear comfortable, well-fitting shoes - check blood pressure weekly - choose a place to take my blood pressure (home, clinic or office, retail store) - write blood pressure results in a log or diary - learn about high blood pressure - keep a blood pressure log - take blood pressure log to all doctor appointments - call doctor for signs and symptoms of high blood pressure - develop an action plan for high blood pressure - keep all doctor appointments - take medications for blood pressure exactly as prescribed - report new symptoms to your doctor - eat more whole grains, fruits and vegetables, lean meats and healthy fats       Plan: Telephone follow up appointment with care management team member scheduled for:  10-10-2021 at 1 pm  [place], MSN, [facility] Mobile: [phone]

## 2021-08-09 ENCOUNTER — Other Ambulatory Visit: Payer: Self-pay | Admitting: Physician Assistant

## 2021-08-16 ENCOUNTER — Encounter: Payer: Self-pay | Admitting: Gastroenterology

## 2021-08-22 ENCOUNTER — Telehealth: Payer: Self-pay

## 2021-08-22 DIAGNOSIS — M1711 Unilateral primary osteoarthritis, right knee: Secondary | ICD-10-CM | POA: Diagnosis not present

## 2021-08-22 NOTE — Telephone Encounter (Signed)
Called and spoke to patient. She understands to go to the lab.  Scheduled her for OV with Dr. Havery Moros

## 2021-08-22 NOTE — Telephone Encounter (Signed)
-----   Message from Marlon Pel, RN sent at 05/22/2021  2:19 PM EDT ----- Needs LFTs- see results 10/24  [place].  Orders are in, please notify the patient

## 2021-08-27 DIAGNOSIS — M1711 Unilateral primary osteoarthritis, right knee: Secondary | ICD-10-CM | POA: Insufficient documentation

## 2021-08-28 ENCOUNTER — Other Ambulatory Visit: Payer: Self-pay | Admitting: Pharmacy Technician

## 2021-09-10 ENCOUNTER — Other Ambulatory Visit: Payer: Self-pay | Admitting: Gastroenterology

## 2021-09-11 ENCOUNTER — Other Ambulatory Visit: Payer: Self-pay | Admitting: Nurse Practitioner

## 2021-09-12 NOTE — Telephone Encounter (Signed)
Requested Prescriptions  Pending Prescriptions Disp Refills   TRULICITY 5.53 ZS/8.2LM SOPN [Pharmacy Med Name: Trulicity 7.86 LJ/4.4BE Subcutaneous Solution Pen-injector] 6 mL 0    Sig: INJECT 1 DOSE (0.75MG)  SUBCUTANEOUSLY ONCE A WEEK     Endocrinology:  Diabetes - GLP-1 Receptor Agonists Passed - 09/11/2021  5:31 AM      Passed - HBA1C is between 0 and 7.9 and within 180 days    Hemoglobin A1C  Date Value Ref Range Status  03/21/2016 7.3%  Final   HB A1C (BAYER DCA - WAIVED)  Date Value Ref Range Status  10/27/2020 8.7 (H) <7.0 % Final    Comment:                                          Diabetic Adult            <7.0                                       Healthy Adult        4.3 - 5.7                                                           (DCCT/NGSP) American Diabetes Association's Summary of Glycemic Recommendations for Adults with Diabetes: Hemoglobin A1c <7.0%. More stringent glycemic goals (A1c <6.0%) may further reduce complications at the cost of increased risk of hypoglycemia.    Hgb A1c MFr Bld  Date Value Ref Range Status  08/01/2021 6.3 (H) 4.8 - 5.6 % Final    Comment:             Prediabetes: 5.7 - 6.4          Diabetes: >6.4          Glycemic control for adults with diabetes: <7.0          Passed - Valid encounter within last 6 months    Recent Outpatient Visits          1 month ago Type 2 diabetes mellitus without complication, without long-term current use of insulin ([place])   [place], Santiago Glad, NP   3 months ago Poorly controlled type 2 diabetes mellitus ([place])   [facility] [name], NP   4 months ago Poorly controlled type 2 diabetes mellitus ([place])   [facility] [name], NP   6 months ago Poorly controlled type 2 diabetes mellitus ([place])   [place] [name], NP   6 months ago Hospital discharge follow-up   [facility] [name], NP       Future Appointments            In 1 month [name], NP [facility], [place]

## 2021-09-17 ENCOUNTER — Other Ambulatory Visit: Payer: Self-pay | Admitting: Nurse Practitioner

## 2021-09-18 NOTE — Telephone Encounter (Signed)
Requested Prescriptions  Pending Prescriptions Disp Refills   pramipexole (MIRAPEX) 1 MG tablet [Pharmacy Med Name: Pramipexole Dihydrochloride 1 MG Oral Tablet] 90 tablet 1    Sig: TAKE 1 TABLET BY MOUTH AT BEDTIME     Neurology:  Parkinsonian Agents Passed - 09/17/2021  6:30 AM      Passed - Last BP in normal range    BP Readings from Last 1 Encounters:  08/01/21 112/73         Passed - Last Heart Rate in normal range    Pulse Readings from Last 1 Encounters:  08/01/21 (!) 108         Passed - Valid encounter within last 12 months    Recent Outpatient Visits          1 month ago Type 2 diabetes mellitus without complication, without long-term current use of insulin ([place])   [place], Santiago Glad, NP   3 months ago Poorly controlled type 2 diabetes mellitus ([place])   [facility] [name], NP   5 months ago Poorly controlled type 2 diabetes mellitus ([place])   [facility] [name], NP   6 months ago Poorly controlled type 2 diabetes mellitus ([place])   [place], Karen, NP   7 months ago Hospital discharge follow-up   [facility] [name], NP      Future Appointments            In 1 month [name], NP [facility], [place]

## 2021-09-21 ENCOUNTER — Other Ambulatory Visit: Payer: Self-pay

## 2021-09-21 ENCOUNTER — Ambulatory Visit (INDEPENDENT_AMBULATORY_CARE_PROVIDER_SITE_OTHER): Payer: BC Managed Care – PPO

## 2021-09-21 VITALS — BP 116/78 | HR 89 | Temp 97.5°F | Resp 18 | Wt 140.0 lb

## 2021-09-21 DIAGNOSIS — K5100 Ulcerative (chronic) pancolitis without complications: Secondary | ICD-10-CM | POA: Diagnosis not present

## 2021-09-21 MED ORDER — SODIUM CHLORIDE 0.9 % IV SOLN: Freq: Once | INTRAVENOUS | Status: DC | PRN

## 2021-09-21 MED ORDER — VEDOLIZUMAB 300 MG IV SOLR
300.0000 mg | Freq: Once | INTRAVENOUS | Status: AC
  Administered 2021-09-21: 300 mg via INTRAVENOUS
  Filled 2021-09-21: qty 5

## 2021-09-21 MED ORDER — METHYLPREDNISOLONE SODIUM SUCC 125 MG IJ SOLR: 125.0000 mg | Freq: Once | INTRAMUSCULAR | Status: DC | PRN

## 2021-09-21 MED ORDER — ALBUTEROL SULFATE HFA 108 (90 BASE) MCG/ACT IN AERS: 2.0000 | INHALATION_SPRAY | Freq: Once | RESPIRATORY_TRACT | Status: DC | PRN

## 2021-09-21 MED ORDER — EPINEPHRINE 0.3 MG/0.3ML IJ SOAJ: 0.3000 mg | Freq: Once | INTRAMUSCULAR | Status: DC | PRN

## 2021-09-21 MED ORDER — DIPHENHYDRAMINE HCL 50 MG/ML IJ SOLN: 50.0000 mg | Freq: Once | INTRAMUSCULAR | Status: DC | PRN

## 2021-09-21 MED ORDER — FAMOTIDINE IN NACL 20-0.9 MG/50ML-% IV SOLN: 20.0000 mg | Freq: Once | INTRAVENOUS | Status: DC | PRN

## 2021-09-21 NOTE — Progress Notes (Signed)
Diagnosis: Crohn's Disease  Provider:  Marshell Garfinkel, MD  Procedure: Infusion  IV Type: Peripheral, IV Location: R Antecubital  Entyvio (Vedolizumab), Dose: 300 mg  Infusion Start Time: 10.52 09/21/2021  Infusion Stop Time: 11.30 09/21/2021  Post Infusion IV Care: Peripheral IV Discontinued  Discharge: Condition: Good, Destination: Home . AVS provided to patient.   Performed by:  Arnoldo Morale, RN

## 2021-09-29 ENCOUNTER — Ambulatory Visit: Payer: BC Managed Care – PPO | Admitting: Gastroenterology

## 2021-10-10 ENCOUNTER — Telehealth: Payer: BC Managed Care – PPO

## 2021-10-10 ENCOUNTER — Ambulatory Visit: Payer: Self-pay

## 2021-10-10 DIAGNOSIS — F329 Major depressive disorder, single episode, unspecified: Secondary | ICD-10-CM

## 2021-10-10 DIAGNOSIS — I10 Essential (primary) hypertension: Secondary | ICD-10-CM

## 2021-10-10 DIAGNOSIS — M79605 Pain in left leg: Secondary | ICD-10-CM

## 2021-10-10 DIAGNOSIS — K529 Noninfective gastroenteritis and colitis, unspecified: Secondary | ICD-10-CM

## 2021-10-10 DIAGNOSIS — E119 Type 2 diabetes mellitus without complications: Secondary | ICD-10-CM

## 2021-10-10 DIAGNOSIS — F419 Anxiety disorder, unspecified: Secondary | ICD-10-CM

## 2021-10-10 DIAGNOSIS — G8929 Other chronic pain: Secondary | ICD-10-CM

## 2021-10-10 DIAGNOSIS — F319 Bipolar disorder, unspecified: Secondary | ICD-10-CM

## 2021-10-10 DIAGNOSIS — R296 Repeated falls: Secondary | ICD-10-CM

## 2021-10-10 NOTE — Patient Instructions (Signed)
Visit Information ? ?Thank you for taking time to visit with me today. Please don't hesitate to contact me if I can be of assistance to you before our next scheduled telephone appointment. ? ?Following are the goals we discussed today:  ?RNCM Clinical Goal(s):  ?Patient will verbalize understanding of plan for management of HTN, DMII, Anxiety, Depression, Bipolar Disorder, and colitis, chron's, chronic pain, and falls prevention  as evidenced by no new falls, stable pain, stable chronic conditions, checking blood sugars as directed and calling the office for changes in conditions ?take all medications exactly as prescribed and will call provider for medication related questions as evidenced by compliance with medications and calling for refills before running out of medications     ?attend all scheduled medical appointments: 10-30-2021 at 0900 am as evidenced by keeping appointments and calling for schedule changes needs         ?demonstrate improved and ongoing health management independence as evidenced by effective management of chronic conditions and working with the CCM team to optimize health and well being        ?demonstrate a decrease in HTN, DMII, Anxiety, Depression, Bipolar Disorder, and Colitis/Chron's, chronic pain, and falls prevention and safety  exacerbations  as evidenced by compliance with the plan of care and working with the pcp and CCM team to effectively manage chronic conditions  ?demonstrate ongoing self health care management ability for effective management of chronic conditions as evidenced by working with the CCM team  through collaboration with Consulting civil engineer, provider, and care team.  ?  ?Interventions: ?1:1 collaboration with primary care provider regarding development and update of comprehensive plan of care as evidenced by provider attestation and co-signature ?Inter-disciplinary care team collaboration (see longitudinal plan of care) ?Evaluation of current treatment plan related to   self management and patient's adherence to plan as established by provider ?  ?  ?Diabetes:  (Status: Goal on Track (progressing): YES.) Long Term Goal  ?  ?     ?Lab Results  ?Component Value Date  ?  HGBA1C 6.3 (H) 08/01/2021  ?Assessed patient's understanding of A1c goal: <7% ?Provided education to patient about basic DM disease process; ?Reviewed medications with patient and discussed importance of medication adherence. 08-08-2021: The patient is no longer taking glipizide and may come of of the Trulicity in April. She is having stable blood sugars and denies any lows at this time. 10-10-2021: The patient is compliant with mediations;        ?Reviewed prescribed diet with patient heart healthy/ADA. 08-08-2021: The patient states she still does not have much of an appetite even since the colitis is better but she is maintaining her weight not losing more weight. She states she is monitoring her dietary intake. 10-10-2021: The patient had dental work on 10-09-2021 and is not eating regularly right now but otherwise doing well. She is maintaining her heart healthy/ADA diet and compliant with dietary restrictions ; ?Counseled on importance of regular laboratory monitoring as prescribed. 10-10-2021: Has regular labwork. No new concerns related to labs at this time. Will have new labs in April. Praised the patient for getting A1C level down.        ?Discussed plans with patient for ongoing care management follow up and provided patient with direct contact information for care management team;      ?Provided patient with written educational materials related to hypo and hyperglycemia and importance of correct treatment. 06-14-2021: Denies any lows at this time, has not been checking  so does not have numbers to provide to [facility]. 08-08-2021: The patient denies any lows at this time. Range has been 87 to 100's. 10-10-2021: Denies any lows at this time. ;       ?Reviewed scheduled/upcoming provider appointments including: 10-30-2021  at 0900 am;         ?Advised patient, providing education and rationale, to check cbg as directed  and record. 06-14-2021: The patient has not been checking blood sugars on a consistent basis. Reviewed the benefits of checking blood sugars and knowing what her levels are. Goals of fasting are <130 and <180 post prandial. The patient states she has trulicity now and she will start taking blood sugars.  08-08-2021: The patient is checking more consistently now and states her range has been 87 to 100's. The patient is doing well with maintaining her blood sugars since her colitis has stabilized and she is no longer having to take steroids for exacerbations. 10-10-2021: The patient states that her blood sugars are really good right now. This am was 144. She denies any acute changes in her blood sugar readings.     ?call provider for findings outside established parameters;       ?Review of patient status, including review of consultants reports, relevant laboratory and other test results, and medications completed;       ?Advised patient to discuss discuss blood sugar trends, and changes in health( the patient has had a recent UTI and toe infection) with provider;      ?  ?Falls:  (Status: Goal on Track (progressing): YES.) Long Term Goal  ?Provided written and verbal education re: potential causes of falls and Fall prevention strategies ?Reviewed medications and discussed potential side effects of medications such as dizziness and frequent urination ?Advised patient of importance of notifying provider of falls. 06-14-2021: Denies any new falls, is using her cane when ambulating. 08-08-2021: The patient states she had a fall about 2 weeks ago when she was trying to get back in the bed in the middle of the night after she had gone to the bathroom. She states she has a scrap on her elbow that is healing. Denies hitting her head or any other injuries. Review of safety precautions. 10-10-2021: The patient denies any new falls  today. The patient is recovering from oral surgery on 10-09-2021 and did not want to talk very long. She feels she is doing well.  ?Assessed for signs and symptoms of orthostatic hypotension. 08-08-2021: Denies any sx and sx of orthostatic hypotension  ?Assessed for falls since last encounter. 08-08-2021: Had a fall about "2 weeks ago". 10-10-2021: Denies any new falls ?Assessed patients knowledge of fall risk prevention secondary to previously provided education ?Provided patient information for fall alert systems ?Assessed working status of life alert bracelet and patient adherence ?Advised patient to discuss new falls and safety concerns with provider ?  ?  ?Depression, Anxiety, Depression   (Status: Goal on Track (progressing): YES.) Long Term Goal  ?Evaluation of current treatment plan related to Anxiety, Depression, and Bipolar Disorder, Mental Health Concerns  self-management and patient's adherence to plan as established by provider. 08-08-2021: Patient is following up with behavioral health on a regular basis. Has had medication changes and these changes have been beneficial. She is sleeping well at night and not feeling groggy during the day. A tremor was noted by the patients husband and daughter. The patient states that she did not notice it until they said something. She feels like the Seroquel  dose is working well for her and she will continue to work with behavioral health for management of mental health. Also has the support of the LCSW and CCM team. 10-10-2021: The patient is doing well and denies any new concerns with her mood, anxiety, depression or bipolar. She was upbeat today and feels like she is doing well. She had oral surgery yesterday and states she feels much better today than she did yesterday. Is working with the CCM team for effective management of her mental health and well being.  ?Discussed plans with patient for ongoing care management follow up and provided patient with direct contact  information for care management team ?Advised patient to provide appropriate vaccination information to provider or CM team member at next visit; ?Advised patient to call the office for changes in mood

## 2021-10-10 NOTE — Chronic Care Management (AMB) (Signed)
? Care Management ?  ? RN Visit Note ? ?10/10/2021 ?Name: Amy Hansen MRN: [id number] DOB: 1956/04/19 ? ?Subjective: ?Amy Hansen is a 66 y.o. year old female who is a primary care patient of Amy Billings, NP. The care management team was consulted for assistance with disease management and care coordination needs.   ? ?Engaged with patient by telephone for follow up visit in response to provider referral for case management and/or care coordination services.  ? ?Consent to Services:  ? Amy Hansen was given information about Care Management services today including:  ?Care Management services includes personalized support from designated clinical staff supervised by her physician, including individualized plan of care and coordination with other care providers ?24/7 contact phone numbers for assistance for urgent and routine care needs. ?The patient may stop case management services at any time by phone call to the office staff. ? ?Patient agreed to services and consent obtained.  ? ?Assessment: Review of patient past medical history, allergies, medications, health status, including review of consultants reports, laboratory and other test data, was performed as part of comprehensive evaluation and provision of chronic care management services.  ? ?SDOH (Social Determinants of Health) assessments and interventions performed:   ? ?Care Plan ? ?Allergies  ?Allergen Reactions  ? Avelox [Moxifloxacin Hcl In Nacl] Anaphylaxis  ? Bactrim [Sulfamethoxazole-Trimethoprim] Anaphylaxis  ? Ciprofloxacin Other (See Comments)  ?  Pt states she was told never to take this as it is in the same family as Avelox.   ? Depakote [Divalproex Sodium] Other (See Comments)  ?  Unknown Reaction  ? Imitrex [Sumatriptan] Other (See Comments)  ?  Neck and shoulder pain  ? Stadol [Butorphanol] Rash  ? ? ?Outpatient Encounter Medications as of 10/10/2021  ?Medication Sig  ? ALPRAZolam (XANAX) 1 MG tablet Take 1 tablet (1 mg total) by mouth  3 (three) times daily as needed for anxiety.  ? buPROPion (WELLBUTRIN XL) 150 MG 24 hr tablet Take 3 tablets by mouth once daily  ? dicyclomine (BENTYL) 10 MG capsule Take 1 capsule (10 mg total) by mouth 3 (three) times daily as needed for spasms.  ? furosemide (LASIX) 20 MG tablet Take 1 tablet (20 mg total) by mouth daily. May take an additional tablet (47m) AS NEEDED for weight gain and/or swelling.  ? gabapentin (NEURONTIN) 100 MG capsule Take 100 mg by mouth 2 (two) times daily as needed.  ? glipiZIDE (GLUCOTROL) 5 MG tablet Take 1 tablet by mouth twice daily.  ? lamoTRIgine (LAMICTAL) 200 MG tablet Take 2 tablets (400 mg total) by mouth at bedtime.  ? metFORMIN (GLUCOPHAGE) 1000 MG tablet Take 1 tablet by mouth twice daily.  ? nystatin (MYCOSTATIN/NYSTOP) powder Apply 1 application topically 3 (three) times daily. (Patient not taking: Reported on 08/02/2021)  ? ondansetron (ZOFRAN-ODT) 4 MG disintegrating tablet Take 1 tablet (4 mg total) by mouth every 8 (eight) hours as needed for nausea or vomiting. Please keep your upcoming appointment in March for further refills.  ? pramipexole (MIRAPEX) 1 MG tablet TAKE 1 TABLET BY MOUTH AT BEDTIME  ? QUEtiapine (SEROQUEL) 25 MG tablet Take 2 tablets (50 mg total) by mouth at bedtime.  ? sertraline (ZOLOFT) 100 MG tablet Take 2 tablets (200 mg total) by mouth daily.  ? TRULICITY 04.43MXV/4.0GQSOPN INJECT 1 DOSE (0.75MG)  SUBCUTANEOUSLY ONCE A WEEK  ? vedolizumab (ENTYVIO) 300 MG injection Inject into the vein every 8 (eight) weeks.  ? ?No facility-administered encounter medications on file  as of 10/10/2021.  ? ? ?Patient Active Problem List  ? Diagnosis Date Noted  ? TIA (transient ischemic attack) 02/20/2021  ? Protein-calorie malnutrition, severe 02/09/2021  ? Abnormal CT scan, gastrointestinal tract   ? BRBPR (bright red blood per rectum) 05/01/2020  ? Weakness 04/25/2020  ? Pancolitis ([place]) 04/25/2020  ? Type 2 diabetes mellitus ([place]) 04/25/2020  ? Colitis 04/21/2020   ? AKI (acute kidney injury) ([place]) 04/21/2020  ? Major depression, chronic 06/01/2018  ? OCD (obsessive compulsive disorder) 06/01/2018  ? Chronic heart failure with preserved ejection fraction ([place]) 05/16/2018  ? Left leg pain 11/04/2017  ? GERD (gastroesophageal reflux disease) 02/18/2017  ? Iron deficiency anemia 10/18/2016  ? Leg swelling 07/17/2016  ? Hypertension 03/21/2016  ? Vitamin D deficiency 02/21/2016  ? B12 deficiency 02/21/2016  ? Poorly controlled type 2 diabetes mellitus ([place]) 09/13/2015  ? Migraines 05/23/2015  ? Restless legs syndrome (RLS) 05/23/2015  ? Recurrent UTI 04/26/2015  ? Atrophic vaginitis 04/26/2015  ? Dyspareunia, female 04/26/2015  ? ? ?Conditions to be addressed/monitored: HTN, DMII, Anxiety, Depression, Bipolar Disorder, and colitis/chron's, chronic pain and falls ? ?Care Plan : RNCM: General Plan of Care (Adult) for Chronic Disease Management and Care Coordination Needs  ?Updates made by Amy Ingles, RN since 10/10/2021 12:00 AM  ?  ? ?Problem: RNCM: Development of Plan of Care for Chronic Disease Management (DM, HTN, Colitis, Chrons, anxiety, depression, Bipolar, chronic pain, Falls)   ?Priority: High  ?  ? ?Long-Range Goal: RNCM: Effective Management of Plan of Care for Chronic Disease Management (DM, HTN, Colitis, Chrons, anxiety, depression, Bipolar, chronic pain, Falls)   ?Start Date: 08/14/2020  ?Expected End Date: 08/14/2021  ?Priority: High  ?Note:   ?Current Barriers:  ?Knowledge Deficits related to plan of care for management of HTN, DMII, Anxiety with Excessive Worry,, Depression: depressed mood ?anxiety ?disturbed sleep, and Bipolar Disorder, and Colitis/Chron's, chronic pain, falls prevention and safety  ?Chronic Disease Management support and education needs related to HTN, DMII, Anxiety with Excessive Worry,, Depression: depressed mood ?anxiety ?insomnia, Bipolar Disorder, and Insomnia/Sleep Difficulties, and Colitis, chron's, chronic pain, and falls and  safety ? ?RNCM Clinical Goal(s):  ?Patient will verbalize understanding of plan for management of HTN, DMII, Anxiety, Depression, Bipolar Disorder, and colitis, chron's, chronic pain, and falls prevention  as evidenced by no new falls, stable pain, stable chronic conditions, checking blood sugars as directed and calling the office for changes in conditions ?take all medications exactly as prescribed and will call provider for medication related questions as evidenced by compliance with medications and calling for refills before running out of medications     ?attend all scheduled medical appointments: 10-30-2021 at 0900 am as evidenced by keeping appointments and calling for schedule changes needs         ?demonstrate improved and ongoing health management independence as evidenced by effective management of chronic conditions and working with the CCM team to optimize health and well being        ?demonstrate a decrease in HTN, DMII, Anxiety, Depression, Bipolar Disorder, and Colitis/Chron's, chronic pain, and falls prevention and safety  exacerbations  as evidenced by compliance with the plan of care and working with the pcp and CCM team to effectively manage chronic conditions  ?demonstrate ongoing self health care management ability for effective management of chronic conditions as evidenced by working with the CCM team  through collaboration with Consulting civil engineer, provider, and care team.  ? ?Interventions: ?1:1 collaboration with primary  care provider regarding development and update of comprehensive plan of care as evidenced by provider attestation and co-signature ?Inter-disciplinary care team collaboration (see longitudinal plan of care) ?Evaluation of current treatment plan related to  self management and patient's adherence to plan as established by provider ? ? ?Diabetes:  (Status: Goal on Track (progressing): YES.) Long Term Goal  ? ?Lab Results  ?Component Value Date  ? HGBA1C 6.3 (H) 08/01/2021  ?Assessed  patient's understanding of A1c goal: <7% ?Provided education to patient about basic DM disease process; ?Reviewed medications with patient and discussed importance of medication adherence. 08-08-2021: [facility]

## 2021-10-16 ENCOUNTER — Other Ambulatory Visit: Payer: Self-pay | Admitting: Physician Assistant

## 2021-10-27 ENCOUNTER — Other Ambulatory Visit: Payer: Self-pay | Admitting: Physician Assistant

## 2021-10-27 ENCOUNTER — Ambulatory Visit: Payer: BC Managed Care – PPO | Admitting: Gastroenterology

## 2021-10-30 ENCOUNTER — Encounter: Payer: Self-pay | Admitting: Nurse Practitioner

## 2021-10-30 ENCOUNTER — Ambulatory Visit: Payer: BC Managed Care – PPO | Admitting: Nurse Practitioner

## 2021-10-30 VITALS — BP 130/85 | HR 86 | Temp 98.4°F | Wt 139.8 lb

## 2021-10-30 DIAGNOSIS — Z136 Encounter for screening for cardiovascular disorders: Secondary | ICD-10-CM

## 2021-10-30 DIAGNOSIS — D509 Iron deficiency anemia, unspecified: Secondary | ICD-10-CM

## 2021-10-30 DIAGNOSIS — E559 Vitamin D deficiency, unspecified: Secondary | ICD-10-CM

## 2021-10-30 DIAGNOSIS — I5032 Chronic diastolic (congestive) heart failure: Secondary | ICD-10-CM

## 2021-10-30 DIAGNOSIS — E119 Type 2 diabetes mellitus without complications: Secondary | ICD-10-CM

## 2021-10-30 DIAGNOSIS — E538 Deficiency of other specified B group vitamins: Secondary | ICD-10-CM

## 2021-10-30 DIAGNOSIS — K5100 Ulcerative (chronic) pancolitis without complications: Secondary | ICD-10-CM

## 2021-10-30 DIAGNOSIS — I10 Essential (primary) hypertension: Secondary | ICD-10-CM

## 2021-10-30 DIAGNOSIS — G43009 Migraine without aura, not intractable, without status migrainosus: Secondary | ICD-10-CM

## 2021-10-30 DIAGNOSIS — F329 Major depressive disorder, single episode, unspecified: Secondary | ICD-10-CM

## 2021-10-30 NOTE — Assessment & Plan Note (Signed)
Labs ordered today.  Will make recommendations based on lab results. ?

## 2021-10-30 NOTE — Assessment & Plan Note (Signed)
Improved since she is no longer on steroids.  Will check a1c at visit today. Will make recommendations based on lab results.  ?

## 2021-10-30 NOTE — Assessment & Plan Note (Signed)
Chronic. Ongoing.  Followed by Pyschiatry.  Continue to follow per their recommendations. Follow up in 6 months.  Call sooner if concerns arise.  ?

## 2021-10-30 NOTE — Assessment & Plan Note (Signed)
Chronic. Has been having them more recently.  Used to use Fioricet.  Will check with Psychiatrist to see if they are okay with her being prescribed Fioricet due to her controlled substance agreement with them.  Patient will call back and let me know.  ?

## 2021-10-30 NOTE — Assessment & Plan Note (Signed)
Chronic.  Controlled.  Continue with current medication regimen.  Labs ordered today.  Return to clinic in 6 months for reevaluation.  Call sooner if concerns arise.  ? ?

## 2021-10-30 NOTE — Assessment & Plan Note (Signed)
Chronic.  Controlled.  Continue with current medication regimen of Entivio every 8 weeks.  Labs ordered today.  Return to clinic in 6 months for reevaluation.  Call sooner if concerns arise.  ? ?

## 2021-10-30 NOTE — Progress Notes (Signed)
? ?BP 130/85   Pulse 86   Temp 98.4 ?F (36.9 ?C) (Oral)   Wt 139 lb 12.8 oz (63.4 kg)   LMP  (LMP Unknown)   SpO2 100%   BMI 28.05 kg/m?   ? ?Subjective:  ? ? Patient ID: Amy Hansen, female    DOB: 10-06-1955, 66 y.o.   MRN: [id number] ? ?HPI: ?Amy Hansen is a 66 y.o. female ? ?Chief Complaint  ?Patient presents with  ? Hypertension  ?  Follow up , states feeling well overall, would like to discuss the bruising she has all over her arms ongoing x couple of years   ? Hyperlipidemia  ? Diabetes  ?  [facility] exam [facility] from [facility]  ? ?HYPERTENSION / HYPERLIPIDEMIA ?Satisfied with current treatment? yes ?Duration of hypertension: years ?BP monitoring frequency: not checking ?BP range:  ?BP medication side effects: no ?Past BP meds:  furosemide ?Duration of hyperlipidemia: years ?Cholesterol medication side effects: no ?Cholesterol supplements: none ?Past cholesterol medications: none ?Medication compliance: excellent compliance ?Aspirin: no ?Recent stressors: no ?Recurrent headaches: no ?Visual changes: no ?Palpitations: no ?Dyspnea: no ?Chest pain: no ?Lower extremity edema: no ?Dizzy/lightheaded: no ? ? ?DIABETES ?Hypoglycemic episodes: no ?Polydipsia/polyuria: no ?Visual disturbance: no ?Chest pain: no ?Paresthesias yes ?Glucose Monitoring: No ? Accucheck frequency: Not checking ? Fasting glucose:  ? Post prandial: ? Evening: ? Before meals: ?Taking Insulin?: no ? Long acting insulin:  ? Short acting insulin: ?Blood Pressure Monitoring: not checking ? ?CHRON'S  ?Patient states her infusion of entivio for her chron's disease.  She had an infusion last week.  She hasn't had any flares since being on the medication.  She is on an 8 week cycle.  She is still doing well with this.   ? ?MIGRAINES ?Patient states she has been getting migraines more frequently. She has used Fioricet in the past which worked well for her.  Wondering if she can have a refill. ? ?MOOD ?Patient states she is doing good.  She  is continuing to follow up with psychiatry.  She is going through oral surgery and that is trying for her but she is doing well overall.  Denies SI. ? ?[place] Office Visit from 08/01/2021 in [place]  ?PHQ-9 Total Score 9  ? ?  ? ? ?  08/01/2021  ?  9:36 AM 05/06/2020  ?  1:26 PM  ?GAD 7 : Generalized Anxiety Score  ?Nervous, Anxious, on Edge 3 3  ?Control/stop worrying 3 3  ?Worry too much - different things 3 3  ?Trouble relaxing 3 2  ?Restless 3 0  ?Easily annoyed or irritable 1 2  ?Afraid - awful might happen 1 3  ?Total GAD 7 Score 17 16  ?Anxiety Difficulty Somewhat difficult Not difficult at all  ? ? ? ?  ? ?Relevant past medical, surgical, family and social history reviewed and updated as indicated. Interim medical history since our last visit reviewed. ?Allergies and medications reviewed and updated. ? ?Review of Systems  ?Eyes:  Negative for visual disturbance.  ?Cardiovascular:  Negative for chest pain.  ?Endocrine: Negative for polydipsia and polyuria.  ?Neurological:  Positive for headaches. Negative for dizziness, light-headedness and numbness.  ?Psychiatric/Behavioral:  Positive for dysphoric mood. Negative for suicidal ideas. The patient is nervous/anxious.   ? ?Per HPI unless specifically indicated above ? ?   ?Objective:  ?  ?BP 130/85   Pulse 86   Temp 98.4 ?F (36.9 ?C) (Oral)   Wt  139 lb 12.8 oz (63.4 kg)   LMP  (LMP Unknown)   SpO2 100%   BMI 28.05 kg/m?   ?Wt Readings from Last 3 Encounters:  ?10/30/21 139 lb 12.8 oz (63.4 kg)  ?09/21/21 140 lb (63.5 kg)  ?08/01/21 138 lb 3.2 oz (62.7 kg)  ?  ?Physical Exam ?Vitals and nursing note reviewed.  ?Constitutional:   ?   General: She is not in acute distress. ?   Appearance: Normal appearance. She is normal weight. She is not ill-appearing, toxic-appearing or diaphoretic.  ?HENT:  ?   Head: Normocephalic.  ?   Right Ear: External ear normal.  ?   Left Ear: External ear normal.  ?   Nose: Nose normal.  ?   Mouth/Throat:  ?    Mouth: Mucous membranes are moist.  ?   Pharynx: Oropharynx is clear.  ?Eyes:  ?   General:     ?   Right eye: No discharge.     ?   Left eye: No discharge.  ?   Extraocular Movements: Extraocular movements intact.  ?   Conjunctiva/sclera: Conjunctivae normal.  ?   Pupils: Pupils are equal, round, and reactive to light.  ?Cardiovascular:  ?   Rate and Rhythm: Normal rate and regular rhythm.  ?   Heart sounds: No murmur heard. ?Pulmonary:  ?   Effort: Pulmonary effort is normal. No respiratory distress.  ?   Breath sounds: Normal breath sounds. No wheezing or rales.  ?Musculoskeletal:  ?   Cervical back: Normal range of motion and neck supple.  ?Skin: ?   General: Skin is warm and dry.  ?   Capillary Refill: Capillary refill takes less than 2 seconds.  ?Neurological:  ?   General: No focal deficit present.  ?   Mental Status: She is alert and oriented to person, place, and time. Mental status is at baseline.  ?Psychiatric:     ?   Mood and Affect: Mood normal.     ?   Behavior: Behavior normal.     ?   Thought Content: Thought content normal.     ?   Judgment: Judgment normal.  ? ? ?Results for orders placed or performed in visit on 08/01/21  ?HgB A1c  ?Result Value Ref Range  ? Hgb A1c MFr Bld 6.3 (H) 4.8 - 5.6 %  ? Est. average glucose Bld gHb Est-mCnc 134 mg/dL  ?Comp Met (CMET)  ?Result Value Ref Range  ? Glucose 129 (H) 70 - 99 mg/dL  ? BUN 8 8 - 27 mg/dL  ? Creatinine, Ser 0.82 0.57 - 1.00 mg/dL  ? eGFR 80 >59 mL/min/1.73  ? BUN/Creatinine Ratio 10 (L) 12 - 28  ? Sodium 136 134 - 144 mmol/L  ? Potassium 4.5 3.5 - 5.2 mmol/L  ? Chloride 96 96 - 106 mmol/L  ? CO2 21 20 - 29 mmol/L  ? Calcium 9.0 8.7 - 10.3 mg/dL  ? Total Protein 6.6 6.0 - 8.5 g/dL  ? Albumin 4.0 3.8 - 4.8 g/dL  ? Globulin, Total 2.6 1.5 - 4.5 g/dL  ? Albumin/Globulin Ratio 1.5 1.2 - 2.2  ? Bilirubin Total <0.2 0.0 - 1.2 mg/dL  ? Alkaline Phosphatase 137 (H) 44 - 121 IU/L  ? AST 22 0 - 40 IU/L  ? ALT 22 0 - 32 IU/L  ?Lipid Profile  ?Result Value  Ref Range  ? Cholesterol, Total 143 100 - 199 mg/dL  ? Triglycerides 234 (H) 0 - 149  mg/dL  ? HDL 37 (L) >39 mg/dL  ? VLDL Cholesterol Cal 38 5 - 40 mg/dL  ? LDL Chol Calc (NIH) 68 0 - 99 mg/dL  ? Chol/HDL Ratio 3.9 0.0 - 4.4 ratio  ?CBC w/Diff  ?Result Value Ref Range  ? WBC 4.4 3.4 - 10.8 x10E3/uL  ? RBC 4.26 3.77 - 5.28 x10E6/uL  ? Hemoglobin 12.9 11.1 - 15.9 g/dL  ? Hematocrit 38.8 34.0 - 46.6 %  ? MCV 91 79 - 97 fL  ? MCH 30.3 26.6 - 33.0 pg  ? MCHC 33.2 31.5 - 35.7 g/dL  ? RDW 14.2 11.7 - 15.4 %  ? Platelets 369 150 - 450 x10E3/uL  ? Neutrophils 48 Not Estab. %  ? Lymphs 44 Not Estab. %  ? Monocytes 6 Not Estab. %  ? Eos 1 Not Estab. %  ? Basos 1 Not Estab. %  ? Neutrophils Absolute 2.1 1.4 - 7.0 x10E3/uL  ? Lymphocytes Absolute 1.9 0.7 - 3.1 x10E3/uL  ? Monocytes Absolute 0.3 0.1 - 0.9 x10E3/uL  ? EOS (ABSOLUTE) 0.0 0.0 - 0.4 x10E3/uL  ? Basophils Absolute 0.0 0.0 - 0.2 x10E3/uL  ? Immature Granulocytes 0 Not Estab. %  ? Immature Grans (Abs) 0.0 0.0 - 0.1 x10E3/uL  ? ?   ?Assessment & Plan:  ? ?Problem List Items Addressed This Visit   ? ?  ? Cardiovascular and Mediastinum  ? Migraines  ?  Chronic. Has been having them more recently.  Used to use Fioricet.  Will check with Psychiatrist to see if they are okay with her being prescribed Fioricet due to her controlled substance agreement with them.  Patient will call back and let me know.  ?  ?  ? Hypertension - Primary  ?  Chronic.  Controlled.  Continue with current medication regimen.  Labs ordered today.  Return to clinic in 6 months for reevaluation.  Call sooner if concerns arise.  ? ?  ?  ? Relevant Orders  ? Comp Met (CMET)  ?  ? Digestive  ? 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  Controlled.  Continue with current medication regimen of Entivio every 8 weeks.  Labs ordered today.  Return to clinic in 6 months for reevaluation.  Call sooner if concerns arise.  ? ?  ?  ?  ? Endocrine  ? Type 2 diabetes mellitus ([place])  ?  Improved since she is no longer on  steroids.  Will check a1c at visit today. Will make recommendations based on lab results.  ?  ?  ? Relevant Orders  ? HgB A1c  ?  ? Other  ? Vitamin D deficiency  ?  Labs ordered today. Will make recommendations based on lab

## 2021-10-31 LAB — CBC WITH DIFFERENTIAL/PLATELET
Basophils Absolute: 0 10*3/uL (ref 0.0–0.2)
Basos: 0 %
EOS (ABSOLUTE): 0 10*3/uL (ref 0.0–0.4)
Eos: 1 %
Hematocrit: 35.4 % (ref 34.0–46.6)
Hemoglobin: 11 g/dL — ABNORMAL LOW (ref 11.1–15.9)
Immature Grans (Abs): 0 10*3/uL (ref 0.0–0.1)
Immature Granulocytes: 1 %
Lymphocytes Absolute: 1.4 10*3/uL (ref 0.7–3.1)
Lymphs: 25 %
MCH: 26.6 pg (ref 26.6–33.0)
MCHC: 31.1 g/dL — ABNORMAL LOW (ref 31.5–35.7)
MCV: 86 fL (ref 79–97)
Monocytes Absolute: 0.3 10*3/uL (ref 0.1–0.9)
Monocytes: 6 %
Neutrophils Absolute: 4 10*3/uL (ref 1.4–7.0)
Neutrophils: 67 %
Platelets: 366 10*3/uL (ref 150–450)
RBC: 4.13 x10E6/uL (ref 3.77–5.28)
RDW: 17.1 % — ABNORMAL HIGH (ref 11.7–15.4)
WBC: 5.8 10*3/uL (ref 3.4–10.8)

## 2021-10-31 LAB — HEMOGLOBIN A1C
Est. average glucose Bld gHb Est-mCnc: 212 mg/dL
Hgb A1c MFr Bld: 9 % — ABNORMAL HIGH (ref 4.8–5.6)

## 2021-10-31 LAB — COMPREHENSIVE METABOLIC PANEL
ALT: 31 IU/L (ref 0–32)
AST: 44 IU/L — ABNORMAL HIGH (ref 0–40)
Albumin/Globulin Ratio: 1.7 (ref 1.2–2.2)
Albumin: 3.8 g/dL (ref 3.8–4.8)
Alkaline Phosphatase: 120 IU/L (ref 44–121)
BUN/Creatinine Ratio: 10 — ABNORMAL LOW (ref 12–28)
BUN: 6 mg/dL — ABNORMAL LOW (ref 8–27)
Bilirubin Total: <0.2 mg/dL (ref 0.0–1.2)
CO2: 21 mmol/L (ref 20–29)
Calcium: 9.4 mg/dL (ref 8.7–10.3)
Chloride: 103 mmol/L (ref 96–106)
Creatinine, Ser: 0.63 mg/dL (ref 0.57–1.00)
Globulin, Total: 2.3 g/dL (ref 1.5–4.5)
Glucose: 230 mg/dL — ABNORMAL HIGH (ref 70–99)
Potassium: 4.2 mmol/L (ref 3.5–5.2)
Sodium: 135 mmol/L (ref 134–144)
Total Protein: 6.1 g/dL (ref 6.0–8.5)
eGFR: 99 mL/min/{1.73_m2} (ref >59)

## 2021-10-31 LAB — LIPID PANEL
Chol/HDL Ratio: 3.4 ratio (ref 0.0–4.4)
Cholesterol, Total: 124 mg/dL (ref 100–199)
HDL: 37 mg/dL — ABNORMAL LOW (ref >39)
LDL Chol Calc (NIH): 48 mg/dL (ref 0–99)
Triglycerides: 250 mg/dL — ABNORMAL HIGH (ref 0–149)
VLDL Cholesterol Cal: 39 mg/dL (ref 5–40)

## 2021-10-31 LAB — VITAMIN B12: Vitamin B-12: 623 pg/mL (ref 232–1245)

## 2021-10-31 LAB — VITAMIN D 25 HYDROXY (VIT D DEFICIENCY, FRACTURES): Vit D, 25-Hydroxy: 25.1 ng/mL — ABNORMAL LOW (ref 30.0–100.0)

## 2021-10-31 NOTE — Progress Notes (Signed)
Please let patient know that her lab work shows that her a1c is elevated to 9.0.  I recommend she increase her dose of Trulicity to 1.5TE weekly.  We should follow up in 3 months and recheck this.  Otherwise, blood work looks good.  We will continue to monitor this in the future.

## 2021-11-15 ENCOUNTER — Encounter: Payer: Self-pay | Admitting: Gastroenterology

## 2021-11-15 ENCOUNTER — Ambulatory Visit: Payer: BC Managed Care – PPO | Admitting: Gastroenterology

## 2021-11-15 VITALS — BP 118/70 | HR 107 | Ht 60.24 in | Wt 140.0 lb

## 2021-11-15 DIAGNOSIS — K523 Indeterminate colitis: Secondary | ICD-10-CM

## 2021-11-15 DIAGNOSIS — R7989 Other specified abnormal findings of blood chemistry: Secondary | ICD-10-CM | POA: Diagnosis not present

## 2021-11-15 DIAGNOSIS — E559 Vitamin D deficiency, unspecified: Secondary | ICD-10-CM | POA: Diagnosis not present

## 2021-11-15 MED ORDER — VITAMIN D 50 MCG (2000 UT) PO TABS: 4000.0000 [IU] | ORAL_TABLET | Freq: Every day | ORAL | Status: DC

## 2021-11-15 MED ORDER — LOPERAMIDE HCL 2 MG PO TABS: 2.0000 mg | ORAL_TABLET | ORAL | 0 refills | Status: DC | PRN

## 2021-11-15 MED ORDER — SUTAB 1479-225-188 MG PO TABS: 1.0000 | ORAL_TABLET | Freq: Once | ORAL | 0 refills | Status: AC

## 2021-11-15 NOTE — Patient Instructions (Addendum)
If you are age 65 or older, your body mass index should be between 23-30. Your Body mass index is 27.13 kg/m?[name] If this is out of the aforementioned range listed, please consider follow up with your Primary Care Provider. ? ?If you are age 47 or younger, your body mass index should be between 19-25. Your Body mass index is 27.13 kg/m?[name] If this is out of the aformentioned range listed, please consider follow up with your Primary Care Provider.  ? ?________________________________________________________ ? ?The [place] [facility] providers would like to encourage you to use [facility] to communicate with providers for non-urgent requests or questions.  Due to long hold times on the telephone, sending your provider a message by [facility] may be a faster and more efficient way to get a response.  Please allow 48 business hours for a response.  Please remember that this is for non-urgent requests.  ?_______________________________________________________ ? ?You have been scheduled for a colonoscopy. Please follow written instructions given to you at your visit today.  ?Please pick up your prep supplies at the pharmacy within the next 1-3 days. ?If you use inhalers (even only as needed), please bring them with you on the day of your procedure. ? ?You will be due for lab work in 1 to 2 months.  We will remind you when it is time to go to the lab in early June. ? ?Continue Entyvio. ? ?Please purchase the following medications over the counter and take as directed: ?Vitamin D supplement: Take 2000 to 5000 units a day ? ?Take Imodium as needed. ? ?Thank you for entrusting me with your care and for choosing [employer], ?Dr. [place] Cellar ? ? ? ?

## 2021-11-15 NOTE — Progress Notes (Signed)
? ?HPI :  ?66 year old female here for a follow-up visit for colitis. ?  ?Colitis History: ?Admitted October 2021 for iron deficiency anemia and diarrhea/colitis.  She had an infectious work-up performed which was negative.  CT scan showed pancolitis at the time.  She underwent a colonoscopy with Dr. Ardis Hughs showing focal active nonspecific ileitis (on biopsies only - endoscopically ileum was normal), patchy nonspecific colitis in the right and left colon on biopsies, pancolonic inflammation grossly.  She was started on prednisone and then transitioned eventually to Lialda maintenance therapy. Colonoscopy with me on March 2022 for surveillance, showed a normal ileum, but with pancolitis that is active, worse in the left colon.  Biopsies showed chronic active colitis throughout.  She was hospitalized 7/13 through 02/10/2021 with acute exacerbation of diarrhea.  There was suspicion for C. difficile and she had been started on empiric vancomycin just prior to admission as well as prednisone.  GI path panel was negative and C. difficile quick screen also negative.  She did complete a course of vancomycin. Eventually transitioned to [facility] once she got health care insurance.  ?  ?Since the last visit: ?I last saw her in October 2022.  She started [facility] around September or so.  At that time she had 2 infusions and her symptoms resolved and was well controlled.  She essentially has been on Entyvio every 8 weeks since that time and she states generally working okay.  She typically can have a few bowel movements per day that are formed and not bothersome to her.  For the past few months however she has had some episodic urgency with occasional fecal incontinence.  Symptoms seem sporadic.  She wears depends at this time in light of some of the accident she has had.  Of note she was started on Trulicity about 6 months ago, perhaps around the time when the symptoms started. ? ?She previously was anemic with a hemoglobin is  9.5 last July, her anemia resolved this past January, hemoglobin few weeks ago was 11.0.  Her hemoglobin A1c has risen to 9.0, being managed per PCP.  She was noted to have vitamin D level of 25.1. ?  ?Of note she has had some elevated liver enzymes last year when checked.  AST ranged in the 70s with ALT from 49-111, normal alk phos and bilirubin.During her hospitalization she had some elevated liver enzymes which was worked up with serologies.  AMA was mildly elevated and ANA was positive however smooth muscle AB negative as well as IgG.  With treatment of her colitis her liver enzymes normalized.  LFTs checked a few weeks ago and her alk phos is 120, AST 44, ALT 31, bili undetectable. ?  ?  ?  ?Prior work-up  ?Colonoscopy 05/03/2020: ?- Mild to moderate inflammation from anus to cecum with a normal appearing terminal ?ileum. Multiple biopsies taken. ?  ?A. SMALL BOWEL, TERMINAL ILEUM, BIOPSY:  ?- Focally active nonspecific ileitis  ?- Negative for features of chronicity or granulomas  ? ?B. COLON, RIGHT, BIOPSY:  ?- Patchy active nonspecific colitis with focal ulceration  ?- Focal microscopic aggregate of epithelioid histiocytes suggestive of a  ?microadenoma  ?- See comment  ? ?C. COLON, LEFT, BIOPSY:  ?- Patchy mildly active nonspecific colitis  ?- Negative for features of chronicity or granulomas ? ?COMMENT:  ? ?A, B and C.   Taken together, the ileocolonic biopsies show patchy  ?active colitis with microgranulomas within the right colon.  Diagnostic  ?histologic features of  idiopathic inflammatory bowel disease, including  ?lamina propria and basal lymphoplasmacytosis or crypt architectural  ?distortion are not present.  Differential diagnosis can include  ?infection, drug effect and evolving/early inflammatory bowel disease  ?(e.g. Crohn's disease).  AFB and GMS special stains are negative for  ?acid-fast bacilli and fungal organisms respectively.  ?Clinical-radiologic correlation and patient follow-up is  suggested.  ?  ?  ?CTAP with contrast 04/21/2020: ?1. Diffuse circumferential wall thickening of virtually all of the ?colon, most notably at the level of the cecum and ascending colon, ?consistent with infectious or inflammatory colitis. ?2. There is a punctate focus of gas within the urinary bladder. ?Correlate for history of recent instrumentation.  ?Aortic Atherosclerosis  ?  ?  ?Colonoscopy 10/06/20 - The perianal and digital rectal examinations were normal. ?- The terminal ileum appeared normal. ?- Patchy inflammation characterized by erosions, granularity, mucus and aphthous ulcerations ?was found in the entire colon, most prominent in the left colon. Biopsies were taken with a ?cold forceps for histology. ?- Scattered medium-mouthed diverticula were found in the entire colon. ?- The exam was otherwise without abnormality. ?  ?1. Surgical [P], random right sided sites ?- CHRONIC ACTIVE COLITIS ?- NO GRANULOMATA, DYSPLASIA OR MALIGNANCY IDENTIFIED ?2. Surgical [P], colon, transverse ?- CHRONIC ACTIVE COLITIS ?- NO GRANULOMATA, DYSPLASIA OR MALIGNANCY IDENTIFIED ?3. Surgical [P], random left sided sites ?- CHRONIC ACTIVE COLITIS ?- NO GRANULOMATA, DYSPLASIA OR MALIGNANCY IDENTIFIED ?  ? ?Past Medical History:  ?Diagnosis Date  ? Anemia   ? Anxiety   ? Arthritis   ? Chronic kidney disease   ? UTI, hematuria in urine  ? Colitis   ? Crohn's disease ([place])   ? Depression   ? Diabetes ([place])   ? Diverticulosis   ? Frequent headaches   ? Interstitial cystitis   ? Recurrent UTI   ? Restless leg syndrome   ? Urinary frequency   ? ? ? ?Past Surgical History:  ?Procedure Laterality Date  ? bariatric bypass  2012  ? BIOPSY  05/03/2020  ? Procedure: BIOPSY;  Surgeon: Milus Banister, MD;  Location: [facility] ENDOSCOPY;  Service: Endoscopy;;  ? CARPAL TUNNEL RELEASE Right 2003  ? CARPAL TUNNEL RELEASE Right   ? 2008  ? CHOLECYSTECTOMY  1975  ? COLONOSCOPY WITH PROPOFOL N/A 05/03/2020  ? Procedure: COLONOSCOPY WITH PROPOFOL;  Surgeon:  Milus Banister, MD;  Location: [facility] ENDOSCOPY;  Service: Endoscopy;  Laterality: N/A;  ? CYSTOSCOPY W/ RETROGRADES Bilateral 06/06/2015  ? Procedure: CYSTOSCOPY WITH RETROGRADE PYELOGRAM;  Surgeon: Festus Aloe, MD;  Location: [facility] ORS;  Service: Urology;  Laterality: Bilateral;  ? FL INJ LEFT KNEE CT ARTHROGRAM ([facility] HX) Left   ? 1995  ? GASTRIC BYPASS  2010  ? HAND SURGERY Left 01/19/2021  ? Thumb  ? HEMORRHOID SURGERY  2013  ? KNEE ARTHROSCOPY Left 1996  ? TONSILLECTOMY    ? TOTAL ABDOMINAL HYSTERECTOMY W/ BILATERAL SALPINGOOPHORECTOMY    ? ?Family History  ?Problem Relation Age of Onset  ? Stroke Father   ? Colon cancer Mother   ? Heart failure Sister   ? Bladder Cancer Neg Hx   ? Kidney disease Neg Hx   ? Prostate cancer Neg Hx   ? Kidney cancer Neg Hx   ? Pancreatic cancer Neg Hx   ? Esophageal cancer Neg Hx   ? Stomach cancer Neg Hx   ? Rectal cancer Neg Hx   ? ?Social History  ? ?Tobacco Use  ? Smoking status: Former  ?  Packs/day: 0.50  ?  Types: Cigarettes  ?  Quit date: 04/25/1975  ?  Years since quitting: 46.5  ? Smokeless tobacco: Never  ? Tobacco comments:  ?  quit 40 years  ?Vaping Use  ? Vaping Use: Never used  ?Substance Use Topics  ? Alcohol use: No  ?  Alcohol/week: 0.0 standard drinks  ? Drug use: No  ? ?Current Outpatient Medications  ?Medication Sig Dispense Refill  ? ALPRAZolam (XANAX) 1 MG tablet TAKE 1 TABLET BY MOUTH THREE TIMES DAILY AS NEEDED FOR ANXIETY 90 tablet 0  ? buPROPion (WELLBUTRIN XL) 150 MG 24 hr tablet Take 3 tablets by mouth once daily 270 tablet 0  ? dicyclomine (BENTYL) 10 MG capsule Take 1 capsule (10 mg total) by mouth 3 (three) times daily as needed for spasms. 90 capsule 4  ? furosemide (LASIX) 20 MG tablet Take 1 tablet (20 mg total) by mouth daily. May take an additional tablet (67m) AS NEEDED for weight gain and/or swelling. 90 tablet 3  ? gabapentin (NEURONTIN) 100 MG capsule Take 100 mg by mouth 2 (two) times daily as needed.    ? glipiZIDE (GLUCOTROL) 5 MG  tablet Take 1 tablet by mouth twice daily. 180 tablet 1  ? lamoTRIgine (LAMICTAL) 200 MG tablet Take 2 tablets (400 mg total) by mouth at bedtime. 60 tablet 11  ? metFORMIN (GLUCOPHAGE) 1000 MG tablet Take 1

## 2021-11-16 ENCOUNTER — Ambulatory Visit (INDEPENDENT_AMBULATORY_CARE_PROVIDER_SITE_OTHER): Payer: BC Managed Care – PPO

## 2021-11-16 VITALS — BP 121/82 | HR 82 | Temp 98.2°F | Resp 18 | Ht 60.0 in | Wt 144.0 lb

## 2021-11-16 DIAGNOSIS — K5100 Ulcerative (chronic) pancolitis without complications: Secondary | ICD-10-CM | POA: Diagnosis not present

## 2021-11-16 MED ORDER — VEDOLIZUMAB 300 MG IV SOLR
300.0000 mg | Freq: Once | INTRAVENOUS | Status: AC
  Administered 2021-11-16: 300 mg via INTRAVENOUS
  Filled 2021-11-16: qty 5

## 2021-11-16 NOTE — Progress Notes (Signed)
Diagnosis: Crohn's Disease ? ?Provider:  Marshell Garfinkel, MD ? ?Procedure: Infusion ? ?IV Type: Peripheral, IV Location: R Forearm ? ?Entyvio (Vedolizumab) Dose: 300 mg ? ?Infusion Start Time: [phone] ? ?Infusion Stop Time: 7026 ? ?Post Infusion IV Care: Peripheral IV Discontinued ? ?Discharge: Condition: Good, Destination: Home . AVS provided to patient.  ? ?Performed by:  Koren Shiver, RN  ?  ?

## 2021-11-17 ENCOUNTER — Other Ambulatory Visit: Payer: Self-pay | Admitting: Physician Assistant

## 2021-11-19 NOTE — Telephone Encounter (Signed)
Last filled 3/27, due 4/25  ?

## 2021-11-29 ENCOUNTER — Other Ambulatory Visit: Payer: Self-pay | Admitting: Physician Assistant

## 2021-11-29 NOTE — Telephone Encounter (Signed)
Has appt with Helene Kelp tomorrow ?

## 2021-11-30 ENCOUNTER — Other Ambulatory Visit: Payer: Self-pay | Admitting: Physician Assistant

## 2021-11-30 ENCOUNTER — Encounter: Payer: Self-pay | Admitting: Physician Assistant

## 2021-11-30 ENCOUNTER — Ambulatory Visit: Payer: BC Managed Care – PPO | Admitting: Physician Assistant

## 2021-11-30 DIAGNOSIS — G4700 Insomnia, unspecified: Secondary | ICD-10-CM

## 2021-11-30 DIAGNOSIS — F411 Generalized anxiety disorder: Secondary | ICD-10-CM

## 2021-11-30 DIAGNOSIS — F319 Bipolar disorder, unspecified: Secondary | ICD-10-CM | POA: Diagnosis not present

## 2021-11-30 DIAGNOSIS — E113393 Type 2 diabetes mellitus with moderate nonproliferative diabetic retinopathy without macular edema, bilateral: Secondary | ICD-10-CM | POA: Diagnosis not present

## 2021-11-30 DIAGNOSIS — G2581 Restless legs syndrome: Secondary | ICD-10-CM

## 2021-11-30 DIAGNOSIS — E559 Vitamin D deficiency, unspecified: Secondary | ICD-10-CM

## 2021-11-30 DIAGNOSIS — Z79899 Other long term (current) drug therapy: Secondary | ICD-10-CM | POA: Diagnosis not present

## 2021-11-30 DIAGNOSIS — R413 Other amnesia: Secondary | ICD-10-CM

## 2021-11-30 MED ORDER — VITAMIN D (ERGOCALCIFEROL) 1.25 MG (50000 UNIT) PO CAPS: 50000.0000 [IU] | ORAL_CAPSULE | ORAL | 0 refills | Status: DC

## 2021-11-30 MED ORDER — ALPRAZOLAM 1 MG PO TABS: 1.0000 mg | ORAL_TABLET | Freq: Three times a day (TID) | ORAL | 5 refills | Status: DC | PRN

## 2021-11-30 MED ORDER — QUETIAPINE FUMARATE 300 MG PO TABS: 300.0000 mg | ORAL_TABLET | Freq: Every day | ORAL | 1 refills | Status: DC

## 2021-11-30 MED ORDER — BUPROPION HCL ER (XL) 150 MG PO TB24: 450.0000 mg | ORAL_TABLET | Freq: Every day | ORAL | 3 refills | Status: DC

## 2021-11-30 NOTE — Progress Notes (Signed)
[facility] ? ?Patient ID: [name],  ?MRN: [id number] ? ?PCP: [name], NP ? ?Date of Evaluation: 11/30/2021   ?Time spent:40 minutes ? ?Chief Complaint:  ?Chief Complaint   ?Anxiety; Depression; Insomnia; Follow-up ?  ? ? ?HISTORY/CURRENT STATUS: ?For routine med check.  Her nephew is with her. ? ?As far as her mental health is concerned she is doing well.  Is able to enjoy things.  Energy and motivation are good for the most part.  Work is going well.  ADLs and personal hygiene are normal.  Does not cry easily.  Sleeps very well.  She increased the Seroquel back to the original dose as it has helped her mood and the sedation she had has resolved.  "I think I just needed to get used to it."  Appetite is normal and weight is stable.  No suicidal or homicidal thoughts. ? ?Reports being a bit more confused in the past few months.  It has concerned her to the point of thinking she may have Alzheimer's.  Last week for example she was driving and could not remember where she was at or how to get where she was going.  States she "snapped out of it" at some point and recognized a landmark.  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  She does not remember things that people have told her.  Feels "fuzzy headed."  History of mini strokes a few years ago.  Is not having symptoms like at that time, no facial drooping, numbness tingling paresthesias, weakness in any extremities, slurred speech, severe headache or visual changes.  When she was hospitalized for that she did see Dr. Melrose Nakayama and has followed up with him since, saw his PA back last fall for headaches. ? ?Still complains of a lot of anxiety.  Xanax does help.  If she does not take it fairly regularly then she will have panic attacks.  Otherwise she feels a sense of dread like something bad is about to happen. ? ?Patient denies increased energy with decreased need for sleep, no increased talkativeness, no racing thoughts, no  impulsivity or risky behaviors, no increased spending, no grandiosity, no increased irritability or anger, and no hallucinations. ? ?Denies dizziness, syncope, seizures, numbness, tingling, tremor, tics, unsteady gait. Denies muscle or joint pain, stiffness, or dystonia. Denies unexplained weight loss, frequent infections, or sores that heal slowly.  No polyphagia, polydipsia, or polyuria. Denies visual changes or paresthesias.  ? ?Individual Medical History/ Review of Systems: Changes? :No    ? ?Past medications for mental health diagnoses include: ?Trazodone, Risperdal, Zoloft, Lunesta, prazosin, Sonata, Prozac, Depakote, Lamictal, lithium, Wellbutrin, Xanax, Ambien, carbamazepine, Seroquel  ? ?Allergies: Avelox [moxifloxacin hcl in nacl], Bactrim [sulfamethoxazole-trimethoprim], Ciprofloxacin, Depakote [divalproex sodium], Imitrex [sumatriptan], and Stadol [butorphanol] ? ?Current Medications:  ?Current Outpatient Medications:  ?  furosemide (LASIX) 20 MG tablet, Take 1 tablet (20 mg total) by mouth daily. May take an additional tablet (49m) AS NEEDED for weight gain and/or swelling., Disp: 90 tablet, Rfl: 3 ?  glipiZIDE (GLUCOTROL) 5 MG tablet, Take 1 tablet by mouth twice daily., Disp: 180 tablet, Rfl: 1 ?  lamoTRIgine (LAMICTAL) 200 MG tablet, Take 2 tablets (400 mg total) by mouth at bedtime., Disp: 60 tablet, Rfl: 11 ?  metFORMIN (GLUCOPHAGE) 1000 MG tablet, Take 1 tablet by mouth twice daily., Disp: 180 tablet, Rfl: 1 ?  ondansetron (ZOFRAN-ODT) 4 MG disintegrating tablet, Take 1 tablet (4 mg total) by mouth every 8 (eight) hours  as needed for nausea or vomiting. Please keep your upcoming appointment in March for further refills., Disp: 30 tablet, Rfl: 0 ?  pramipexole (MIRAPEX) 1 MG tablet, TAKE 1 TABLET BY MOUTH AT BEDTIME, Disp: 90 tablet, Rfl: 1 ?  QUEtiapine (SEROQUEL) 300 MG tablet, Take 1 tablet (300 mg total) by mouth at bedtime., Disp: 90 tablet, Rfl: 1 ?  sertraline (ZOLOFT) 100 MG tablet, Take 2  tablets (200 mg total) by mouth daily., Disp: 60 tablet, Rfl: 11 ?  TRULICITY 7.12 RF/7.5OI SOPN, INJECT 1 DOSE (0.75MG)  SUBCUTANEOUSLY ONCE A WEEK, Disp: 6 mL, Rfl: 0 ?  vedolizumab (ENTYVIO) 300 MG injection, Inject into the vein every 8 (eight) weeks., Disp: , Rfl:  ?  Vitamin D, Ergocalciferol, (DRISDOL) 1.25 MG (50000 UNIT) CAPS capsule, Take 1 capsule (50,000 Units total) by mouth every 7 (seven) days., Disp: 12 capsule, Rfl: 0 ?  ALPRAZolam (XANAX) 1 MG tablet, Take 1 tablet (1 mg total) by mouth 3 (three) times daily as needed. for anxiety, Disp: 90 tablet, Rfl: 5 ?  buPROPion (WELLBUTRIN XL) 150 MG 24 hr tablet, Take 3 tablets (450 mg total) by mouth daily., Disp: 270 tablet, Rfl: 3 ?  Cholecalciferol (VITAMIN D) 50 MCG (2000 UT) tablet, Take 2 tablets (4,000 Units total) by mouth daily. (Patient not taking: Reported on 11/30/2021), Disp: , Rfl:  ?  dicyclomine (BENTYL) 10 MG capsule, Take 1 capsule (10 mg total) by mouth 3 (three) times daily as needed for spasms. (Patient not taking: Reported on 11/30/2021), Disp: 90 capsule, Rfl: 4 ?  gabapentin (NEURONTIN) 100 MG capsule, Take 100 mg by mouth 2 (two) times daily as needed. (Patient not taking: Reported on 11/30/2021), Disp: , Rfl:  ?  loperamide (IMODIUM A-D) 2 MG tablet, Take 1 tablet (2 mg total) by mouth as needed for diarrhea or loose stools. (Patient not taking: Reported on 11/30/2021), Disp: 30 tablet, Rfl: 0 ?Medication Side Effects: none ? ?Family Medical/ Social History: Changes?  No ? ?MENTAL HEALTH EXAM: ? ?There were no vitals taken for this visit.There is no height or weight on file to calculate BMI.  ?General Appearance: Casual, Neat, and Well Groomed  ?Eye Contact:  Good  ?Speech:  Clear and Coherent and Normal Rate  ?Volume:  Normal  ?Mood:  Euthymic  ?Affect:  Appropriate  ?Thought Process:  Goal Directed and Descriptions of Associations: Circumstantial  ?Orientation:  Full (Time, Place, and Person)  ?Thought Content: Logical   ?Suicidal  Thoughts:  No  ?Homicidal Thoughts:  No  ?Memory:  WNL  ?Judgement:  Good  ?Insight:  Good  ?Psychomotor Activity:  Normal  ?Concentration:  Concentration: Good and Attention Span: Good  ?Recall:  Good  ?Fund of Knowledge: Good  ?Language: Good  ?Assets:  Desire for Improvement ?Financial Resources/Insurance ?Housing ?Social Support ?Transportation  ?ADL's:  Intact  ?Cognition: WNL  ?Prognosis:  Good  ? ?Labs ?10/30/2021 ?CBC Hgb 11.0 o/w essentially nl ?CMP Glu 230, BUN/cr nl, AST 44 o/w nl ?Lipids Trig 250, HDL 37 ?A1C 9.0 ?B12 612 ?Vit D 25.1 ? ?DIAGNOSES:  ?  ICD-10-CM   ?1. Bipolar I disorder ([place])  F31.9   ?  ?2. Hypovitaminosis D  E55.9 VITAMIN D 25 Hydroxy (Vit-D Deficiency, Fractures)  ?  ?3. Encounter for long-term (current) use of medications  Z79.899 VITAMIN D 25 Hydroxy (Vit-D Deficiency, Fractures)  ?  ?4. Insomnia, unspecified type  G47.00   ?  ?5. Restless legs syndrome (RLS)  G25.81   ?  ?  6. Generalized anxiety disorder  F41.1   ?  ?7. Memory changes  R41.3   ?  ? ? ?Receiving Psychotherapy: No  ? ? ?RECOMMENDATIONS:  ?PDMP reviewed.  Last Xanax was filled 11/21/2021. Hydrocodone and oxycodone since LOV.  ?I provided 40 minutes of face to face time during this encounter, including time spent before and after the visit in records review, medical decision making, counseling pertinent to today's visit, and charting.  ?We discussed the memory and confusion issues that come and go.  As far as her mental health medications go she is on a benzodiazepine as well as Seroquel and pramipexole that all could be contributing to the symptoms.  There have been no recent changes however except for increasing the Seroquel within the past 3 months.  It is hard to say if that is related or not.  I recommend she get back in with her neurologist for evaluation.  She will call and make an appointment as soon as she can get in.  Of course if the symptoms worsen she should go to the ER, especially if accompanied by slurred  speech, weakness, paresthesia, etc. ?Discussed her low vitamin D level.  She has been on the weekly dose in the past.  I recommend restarting that and will recheck labs in a couple of months when she has routine labs

## 2021-12-02 ENCOUNTER — Other Ambulatory Visit: Payer: Self-pay | Admitting: Physician Assistant

## 2021-12-04 ENCOUNTER — Telehealth: Payer: Self-pay | Admitting: Physician Assistant

## 2021-12-04 NOTE — Telephone Encounter (Signed)
Pt called at 4:05 pm and said that the pharmacy never received the seroquel 300 mg. It looks like it went to print instead. Please resend script to the [facility] in [place] ?

## 2021-12-05 MED ORDER — QUETIAPINE FUMARATE 300 MG PO TABS: 300.0000 mg | ORAL_TABLET | Freq: Every day | ORAL | 1 refills | Status: DC

## 2021-12-05 NOTE — Telephone Encounter (Signed)
Rx sent 

## 2021-12-12 ENCOUNTER — Ambulatory Visit: Payer: Self-pay

## 2021-12-12 ENCOUNTER — Telehealth: Payer: BC Managed Care – PPO

## 2021-12-12 DIAGNOSIS — E119 Type 2 diabetes mellitus without complications: Secondary | ICD-10-CM

## 2021-12-12 DIAGNOSIS — F319 Bipolar disorder, unspecified: Secondary | ICD-10-CM

## 2021-12-12 DIAGNOSIS — I10 Essential (primary) hypertension: Secondary | ICD-10-CM

## 2021-12-12 DIAGNOSIS — G8929 Other chronic pain: Secondary | ICD-10-CM

## 2021-12-12 DIAGNOSIS — K529 Noninfective gastroenteritis and colitis, unspecified: Secondary | ICD-10-CM

## 2021-12-12 DIAGNOSIS — F329 Major depressive disorder, single episode, unspecified: Secondary | ICD-10-CM

## 2021-12-12 DIAGNOSIS — F419 Anxiety disorder, unspecified: Secondary | ICD-10-CM

## 2021-12-12 DIAGNOSIS — R413 Other amnesia: Secondary | ICD-10-CM

## 2021-12-12 DIAGNOSIS — R296 Repeated falls: Secondary | ICD-10-CM

## 2021-12-12 NOTE — Chronic Care Management (AMB) (Signed)
? Care Management ?  ? RN Visit Note ? ?12/12/2021 ?Name: Amy Hansen MRN: [id number] DOB: Nov 29, 1955 ? ?Subjective: ?Amy Hansen is a 66 y.o. year old female who is a primary care patient of Amy Billings, NP. The care management team was consulted for assistance with disease management and care coordination needs.   ? ?Engaged with patient by telephone for follow up visit in response to provider referral for case management and/or care coordination services.  ? ?Consent to Services:  ? Ms. Amy Hansen was given information about Care Management services today including:  ?Care Management services includes personalized support from designated clinical staff supervised by her physician, including individualized plan of care and coordination with other care providers ?24/7 contact phone numbers for assistance for urgent and routine care needs. ?The patient may stop case management services at any time by phone call to the office staff. ? ?Patient agreed to services and consent obtained.  ? ?Assessment: Review of patient past medical history, allergies, medications, health status, including review of consultants reports, laboratory and other test data, was performed as part of comprehensive evaluation and provision of chronic care management services.  ? ?SDOH (Social Determinants of Health) assessments and interventions performed:   ? ?Care Plan ? ?Allergies  ?Allergen Reactions  ? Avelox [Moxifloxacin Hcl In Nacl] Anaphylaxis  ? Bactrim [Sulfamethoxazole-Trimethoprim] Anaphylaxis  ? Ciprofloxacin Other (See Comments)  ?  Pt states she was told never to take this as it is in the same family as Avelox.   ? Depakote [Divalproex Sodium] Other (See Comments)  ?  Unknown Reaction  ? Imitrex [Sumatriptan] Other (See Comments)  ?  Neck and shoulder pain  ? Stadol [Butorphanol] Rash  ? ? ?Outpatient Encounter Medications as of 12/12/2021  ?Medication Sig  ? ALPRAZolam (XANAX) 1 MG tablet Take 1 tablet (1 mg total) by mouth  3 (three) times daily as needed. for anxiety  ? buPROPion (WELLBUTRIN XL) 150 MG 24 hr tablet Take 3 tablets (450 mg total) by mouth daily.  ? Cholecalciferol (VITAMIN D) 50 MCG (2000 UT) tablet Take 2 tablets (4,000 Units total) by mouth daily. (Patient not taking: Reported on 11/30/2021)  ? dicyclomine (BENTYL) 10 MG capsule Take 1 capsule (10 mg total) by mouth 3 (three) times daily as needed for spasms. (Patient not taking: Reported on 11/30/2021)  ? furosemide (LASIX) 20 MG tablet Take 1 tablet (20 mg total) by mouth daily. May take an additional tablet (61m) AS NEEDED for weight gain and/or swelling.  ? gabapentin (NEURONTIN) 100 MG capsule Take 100 mg by mouth 2 (two) times daily as needed. (Patient not taking: Reported on 11/30/2021)  ? glipiZIDE (GLUCOTROL) 5 MG tablet Take 1 tablet by mouth twice daily.  ? lamoTRIgine (LAMICTAL) 200 MG tablet Take 2 tablets (400 mg total) by mouth at bedtime.  ? loperamide (IMODIUM A-D) 2 MG tablet Take 1 tablet (2 mg total) by mouth as needed for diarrhea or loose stools. (Patient not taking: Reported on 11/30/2021)  ? metFORMIN (GLUCOPHAGE) 1000 MG tablet Take 1 tablet by mouth twice daily.  ? ondansetron (ZOFRAN-ODT) 4 MG disintegrating tablet Take 1 tablet (4 mg total) by mouth every 8 (eight) hours as needed for nausea or vomiting. Please keep your upcoming appointment in March for further refills.  ? pramipexole (MIRAPEX) 1 MG tablet TAKE 1 TABLET BY MOUTH AT BEDTIME  ? QUEtiapine (SEROQUEL) 300 MG tablet Take 1 tablet (300 mg total) by mouth at bedtime.  ? sertraline (ZOLOFT) 100  MG tablet Take 2 tablets (200 mg total) by mouth daily.  ? TRULICITY 2.29 NL/8.9QJ SOPN INJECT 1 DOSE (0.75MG)  SUBCUTANEOUSLY ONCE A WEEK  ? vedolizumab (ENTYVIO) 300 MG injection Inject into the vein every 8 (eight) weeks.  ? Vitamin D, Ergocalciferol, (DRISDOL) 1.25 MG (50000 UNIT) CAPS capsule Take 1 capsule (50,000 Units total) by mouth every 7 (seven) days.  ? ?No facility-administered  encounter medications on file as of 12/12/2021.  ? ? ?Patient Active Problem List  ? Diagnosis Date Noted  ? TIA (transient ischemic attack) 02/20/2021  ? Protein-calorie malnutrition, severe 02/09/2021  ? Abnormal CT scan, gastrointestinal tract   ? BRBPR (bright red blood per rectum) 05/01/2020  ? Weakness 04/25/2020  ? Pancolitis ([place]) 04/25/2020  ? Type 2 diabetes mellitus ([place]) 04/25/2020  ? Colitis 04/21/2020  ? AKI (acute kidney injury) ([place]) 04/21/2020  ? Major depression, chronic 06/01/2018  ? OCD (obsessive compulsive disorder) 06/01/2018  ? Chronic heart failure with preserved ejection fraction ([place]) 05/16/2018  ? Left leg pain 11/04/2017  ? GERD (gastroesophageal reflux disease) 02/18/2017  ? Iron deficiency anemia 10/18/2016  ? Leg swelling 07/17/2016  ? Hypertension 03/21/2016  ? Vitamin D deficiency 02/21/2016  ? B12 deficiency 02/21/2016  ? Poorly controlled type 2 diabetes mellitus ([place]) 09/13/2015  ? Migraines 05/23/2015  ? Restless legs syndrome (RLS) 05/23/2015  ? Recurrent UTI 04/26/2015  ? Atrophic vaginitis 04/26/2015  ? Dyspareunia, female 04/26/2015  ? ? ?Conditions to be addressed/monitored: HTN, DMII, Anxiety, Depression, Bipolar Disorder, and Colitis, Chron's, chronic pain and falls ? ?Care Plan : RNCM: General Plan of Care (Adult) for Chronic Disease Management and Care Coordination Needs  ?Updates made by Vanita Ingles, RN since 12/12/2021 12:00 AM  ?  ? ?Problem: RNCM: Development of Plan of Care for Chronic Disease Management (DM, HTN, Colitis, Chrons, anxiety, depression, Bipolar, chronic pain, Falls)   ?Priority: High  ?  ? ?Long-Range Goal: RNCM: Effective Management of Plan of Care for Chronic Disease Management (DM, HTN, Colitis, Chrons, anxiety, depression, Bipolar, chronic pain, Falls)   ?Start Date: 08/14/2020  ?Expected End Date: 08/14/2021  ?Priority: High  ?Note:   ?Current Barriers:  ?Knowledge Deficits related to plan of care for management of HTN, DMII, Anxiety with  Excessive Worry,, Depression: depressed mood ?anxiety ?disturbed sleep, and Bipolar Disorder, and Colitis/Chron's, chronic pain, falls prevention and safety  ?Chronic Disease Management support and education needs related to HTN, DMII, Anxiety with Excessive Worry,, Depression: depressed mood ?anxiety ?insomnia, Bipolar Disorder, and Insomnia/Sleep Difficulties, and Colitis, chron's, chronic pain, and falls and safety ? ?RNCM Clinical Goal(s):  ?Patient will verbalize understanding of plan for management of HTN, DMII, Anxiety, Depression, Bipolar Disorder, and colitis, chron's, chronic pain, and falls prevention  as evidenced by no new falls, stable pain, stable chronic conditions, checking blood sugars as directed and calling the office for changes in conditions ?take all medications exactly as prescribed and will call provider for medication related questions as evidenced by compliance with medications and calling for refills before running out of medications     ?attend all scheduled medical appointments: routine appointments with the pcp and specialist as evidenced by keeping appointments and calling for schedule changes needs         ?demonstrate improved and ongoing health management independence as evidenced by effective management of chronic conditions and working with the CCM team to optimize health and well being        ?demonstrate a decrease in HTN, DMII, Anxiety,  Depression, Bipolar Disorder, and Colitis/Chron's, chronic pain, and falls prevention and safety  exacerbations  as evidenced by compliance with the plan of care and working with the pcp and CCM team to effectively manage chronic conditions  ?demonstrate ongoing self health care management ability for effective management of chronic conditions as evidenced by working with the CCM team  through collaboration with Consulting civil engineer, provider, and care team.  ? ?Interventions: ?1:1 collaboration with primary care provider regarding development and  update of comprehensive plan of care as evidenced by provider attestation and co-signature ?Inter-disciplinary care team collaboration (see longitudinal plan of care) ?Evaluation of current treatment plan related

## 2021-12-12 NOTE — Patient Instructions (Signed)
Visit Information ? ?Thank you for taking time to visit with me today. Please don't hesitate to contact me if I can be of assistance to you before our next scheduled telephone appointment. ? ?Following are the goals we discussed today:  ?Diabetes:  (Status: Goal on Track (progressing): YES.) Long Term Goal  ?  ?     ?Lab Results  ?Component Value Date  ?  HGBA1C 9.0 (H) 10/30/2021  ?Previous A1C 6.3 on 08-01-2021 ?Assessed patient's understanding of A1c goal: <7%. 12-12-2021: The patient knows the goal of A1c is <7.0. Does well until having to take steroids for her Chrons/IBS/colitis flare ups. Review of goals of A1C today ?Provided education to patient about basic DM disease process. 12-12-2021: Review and education given; ?Reviewed medications with patient and discussed importance of medication adherence. 08-08-2021: The patient is no longer taking glipizide and may come of of the Trulicity in April. She is having stable blood sugars and denies any lows at this time. 12-12-2021: The patient is compliant with mediations. The patient states that she has increased her Trulicity to 1.5 mg weekly and this is helping;        ?Reviewed prescribed diet with patient heart healthy/ADA. 08-08-2021: The patient states she still does not have much of an appetite even since the colitis is better but she is maintaining her weight not losing more weight. She states she is monitoring her dietary intake. 10-10-2021: The patient had dental work on 10-09-2021 and is not eating regularly right now but otherwise doing well. She is maintaining her heart healthy/ADA diet and compliant with dietary restrictions. 12-12-2021: The patient is eating better but did have a flare up of her colitis. She is feeling much better now and mindful of dietary restrictions.  ; ?Counseled on importance of regular laboratory monitoring as prescribed. 10-10-2021: Has regular labwork. No new concerns related to labs at this time. Will have new labs in April. Praised  the patient for getting A1C level down.  12-12-2021: Has labs on a regular basis. Review goals of A1C and blood sugar ranges.      ?Discussed plans with patient for ongoing care management follow up and provided patient with direct contact information for care management team;      ?Provided patient with written educational materials related to hypo and hyperglycemia and importance of correct treatment. 06-14-2021: Denies any lows at this time, has not been checking so does not have numbers to provide to [facility]. 08-08-2021: The patient denies any lows at this time. Range has been 87 to 100's. 10-10-2021: Denies any lows at this time. 12-12-2021: The patient is not checking blood sugars like she should. She could not provide numbers but knows how to manage hypo and hyperglycemia ;       ?Reviewed scheduled/upcoming provider appointments including: 05-01-2022 at 0920 am;         ?Advised patient, providing education and rationale, to check cbg as directed  and record. 06-14-2021: The patient has not been checking blood sugars on a consistent basis. Reviewed the benefits of checking blood sugars and knowing what her levels are. Goals of fasting are <130 and <180 post prandial. The patient states she has trulicity now and she will start taking blood sugars.  08-08-2021: The patient is checking more consistently now and states her range has been 87 to 100's. The patient is doing well with maintaining her blood sugars since her colitis has stabilized and she is no longer having to take steroids for exacerbations.  12-12-2021: The patient admits that she is not checking her blood sugars like she should right now. She states that she knows she needs to do better. Encouraged the patient to start back taking blood sugars on a regular basis especially with elevated A1C and changes in medications.   ?call provider for findings outside established parameters;       ?Review of patient status, including review of consultants reports,  relevant laboratory and other test results, and medications completed;       ?Advised patient to discuss discuss blood sugar trends, and changes in health( the patient has had a recent UTI and toe infection) with provider;      ?  ?Falls:  (Status: Goal on Track (progressing): YES.) Long Term Goal  ?Provided written and verbal education re: potential causes of falls and Fall prevention strategies ?Reviewed medications and discussed potential side effects of medications such as dizziness and frequent urination ?Advised patient of importance of notifying provider of falls. 06-14-2021: Denies any new falls, is using her cane when ambulating. 08-08-2021: The patient states she had a fall about 2 weeks ago when she was trying to get back in the bed in the middle of the night after she had gone to the bathroom. She states she has a scrap on her elbow that is healing. Denies hitting her head or any other injuries. Review of safety precautions. 10-10-2021: The patient denies any new falls today. The patient is recovering from oral surgery on 10-09-2021 and did not want to talk very long. She feels she is doing well. 12-12-2021: the patient denies falls but did hit the coffee table last week and broke her toe. She states she did not go and get it checked out because she has broken her toe before and they did not do anything for it. She states it is "black and blue". Review of safety and monitoring for changes.  ?Assessed for signs and symptoms of orthostatic hypotension. 12-12-2021: Denies any sx and sx of orthostatic hypotension  ?Assessed for falls since last encounter. 08-08-2021: Had a fall about "2 weeks ago". 12-12-2021: Denies any new falls but hit her toe last week on the coffee table and states that she broke her toe.  ?Assessed patients knowledge of fall risk prevention secondary to previously provided education. 12-12-2021: Review with education and support given. ?Provided patient information for fall alert  systems ?Assessed working status of life alert bracelet and patient adherence ?Advised patient to discuss new falls and safety concerns with provider ?  ?  ?Depression, Anxiety, Depression, Memory changes  (Status: Goal on Track (progressing): YES.) Long Term Goal  ?Evaluation of current treatment plan related to Anxiety, Depression, and Bipolar Disorder, Memory changes. Mental Health Concerns  self-management and patient's adherence to plan as established by provider. 08-08-2021: Patient is following up with behavioral health on a regular basis. Has had medication changes and these changes have been beneficial. She is sleeping well at night and not feeling groggy during the day. A tremor was noted by the patients husband and daughter. The patient states that she did not notice it until they said something. She feels like the Seroquel dose is working well for her and she will continue to work with behavioral health for management of mental health. Also has the support of the LCSW and CCM team. 10-10-2021: The patient is doing well and denies any new concerns with her mood, anxiety, depression or bipolar. She was upbeat today and feels like she is doing  well. She had oral surgery yesterday and states she feels much better today than she did yesterday. Is working with the CCM team for effective management of her mental health and well being. 12-12-2021: The patient over all feels her mental health is stable. She states that she has some increased anxiety at times but usually can work through it without difficulty. The patient states that she is concerned about her memory as she is forgetful a lot. 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 The patient encouraged to discuss new concerns with her providers.  ?Discussed plans with patient for ongoing care management follow up and provided patient with direct contact information for care management team ?Advised patient  to provide appropriate vaccination information to provider or CM team member at next visit; ?Advised patient to call the office for changes in mood, anxiety, or depression, or memory. 12-12-2021: Education on doing word pu

## 2021-12-15 ENCOUNTER — Encounter: Payer: Self-pay | Admitting: Oncology

## 2021-12-15 ENCOUNTER — Other Ambulatory Visit (INDEPENDENT_AMBULATORY_CARE_PROVIDER_SITE_OTHER): Payer: BC Managed Care – PPO

## 2021-12-15 ENCOUNTER — Encounter: Payer: Self-pay | Admitting: Gastroenterology

## 2021-12-15 DIAGNOSIS — R7989 Other specified abnormal findings of blood chemistry: Secondary | ICD-10-CM | POA: Diagnosis not present

## 2021-12-15 LAB — HEPATIC FUNCTION PANEL
ALT: 16 U/L (ref 0–35)
AST: 14 U/L (ref 0–37)
Albumin: 4.1 g/dL (ref 3.5–5.2)
Alkaline Phosphatase: 120 U/L — ABNORMAL HIGH (ref 39–117)
Bilirubin, Direct: 0.1 mg/dL (ref 0.0–0.3)
Total Bilirubin: 0.3 mg/dL (ref 0.2–1.2)
Total Protein: 7.5 g/dL (ref 6.0–8.3)

## 2021-12-18 ENCOUNTER — Other Ambulatory Visit: Payer: Self-pay

## 2021-12-18 DIAGNOSIS — R7989 Other specified abnormal findings of blood chemistry: Secondary | ICD-10-CM

## 2021-12-26 ENCOUNTER — Encounter: Payer: Self-pay | Admitting: Gastroenterology

## 2021-12-26 ENCOUNTER — Telehealth: Payer: Self-pay

## 2021-12-26 ENCOUNTER — Ambulatory Visit (AMBULATORY_SURGERY_CENTER): Payer: BC Managed Care – PPO | Admitting: Gastroenterology

## 2021-12-26 VITALS — BP 148/83 | HR 68 | Temp 97.5°F | Resp 10 | Ht 60.0 in | Wt 140.0 lb

## 2021-12-26 DIAGNOSIS — K50119 Crohn's disease of large intestine with unspecified complications: Secondary | ICD-10-CM

## 2021-12-26 DIAGNOSIS — K523 Indeterminate colitis: Secondary | ICD-10-CM

## 2021-12-26 DIAGNOSIS — K621 Rectal polyp: Secondary | ICD-10-CM

## 2021-12-26 DIAGNOSIS — R7989 Other specified abnormal findings of blood chemistry: Secondary | ICD-10-CM

## 2021-12-26 DIAGNOSIS — D128 Benign neoplasm of rectum: Secondary | ICD-10-CM

## 2021-12-26 DIAGNOSIS — K529 Noninfective gastroenteritis and colitis, unspecified: Secondary | ICD-10-CM | POA: Diagnosis not present

## 2021-12-26 DIAGNOSIS — K5730 Diverticulosis of large intestine without perforation or abscess without bleeding: Secondary | ICD-10-CM

## 2021-12-26 MED ORDER — SODIUM CHLORIDE 0.9 % IV SOLN: 500.0000 mL | Freq: Once | INTRAVENOUS | Status: DC

## 2021-12-26 NOTE — Progress Notes (Signed)
Cell phone off per pt  Pt took Sutab at 1000 on 12-25-21 and 1900 12-25-21-results are cloudy, no solid stools. Dr. Havery Moros made aware

## 2021-12-26 NOTE — Telephone Encounter (Signed)
-----   Message from Roetta Sessions, [place] sent at 11/15/2021 10:25 AM EDT ----- Regarding: due for labs in early June Due for IBC/Ferritin, LFTs, IgG, ANA and Creatine Kinase in early June

## 2021-12-26 NOTE — Progress Notes (Signed)
Called to room to assist during endoscopic procedure.  Patient ID and intended procedure confirmed with present staff. Received instructions for my participation in the procedure from the performing physician.  

## 2021-12-26 NOTE — Progress Notes (Signed)
PT taken to PACU. Monitors in place. VSS. Report given to RN. 

## 2021-12-26 NOTE — Telephone Encounter (Signed)
Labs entered. Called and LM for patient to go to the lab one day this week or next.  MyChart message sent as well

## 2021-12-26 NOTE — Op Note (Signed)
Amy Hansen: Amy Hansen Procedure Date: 12/26/2021 10:20 AM MRN: [id number] Endoscopist: Remo Lipps P. Havery Moros , MD Age: 66 Referring MD:  Date of Birth: 01-06-1956 Gender: Female Account #: [id number] Procedure:                Colonoscopy Indications:              disease activity assessment of IBD - indeterminate                            colitis, suspected Crohn's disease. On Entyvio                            every 8 weeks Medicines:                Monitored Anesthesia Care Procedure:                Pre-Anesthesia Assessment:                           - Prior to the procedure, a History and Physical                            was performed, and patient medications and                            allergies were reviewed. The patient's tolerance of                            previous anesthesia was also reviewed. The risks                            and benefits of the procedure and the sedation                            options and risks were discussed with the patient.                            All questions were answered, and informed consent                            was obtained. Prior Anticoagulants: The patient has                            taken no previous anticoagulant or antiplatelet                            agents. ASA Grade Assessment: III - A patient with                            severe systemic disease. After reviewing the risks                            and benefits, the patient was deemed in  satisfactory condition to undergo the procedure.                           After obtaining informed consent, the colonoscope                            was passed under direct vision. Throughout the                            procedure, the patient's blood pressure, pulse, and                            oxygen saturations were monitored continuously. The                            Olympus PCF-H190DL (#[id number])  Colonoscope was                            introduced through the anus and advanced to the the                            terminal ileum, with identification of the                            appendiceal orifice and IC valve. The colonoscopy                            was performed without difficulty. The patient                            tolerated the procedure well. The quality of the                            bowel preparation was adequate. The terminal ileum,                            ileocecal valve, appendiceal orifice, and rectum                            were photographed. Scope In: 10:41:10 AM Scope Out: 11:10:50 AM Scope Withdrawal Time: 0 hours 17 minutes 40 seconds  Total Procedure Duration: 0 hours 29 minutes 40 seconds  Findings:                 The perianal and digital rectal examinations were                            normal.                           The terminal ileum appeared normal.                           Multiple small-mouthed diverticula were found in  the entire colon.                           Localized mild inflammation characterized by                            erosions and erythema was found in the transverse                            colon in one very focal area. Biopsies were taken                            with a cold forceps for histology.                           A 4 mm polyp was found in the distal rectum. The                            polyp was sessile. The polyp was removed with a                            cold biopsy forceps. Resection and retrieval were                            complete.                           The exam was otherwise without abnormality in                            regards to inflammatory burden. No overt                            inflammation seen elsewhere. There was significant                            residual stool. The prep was adequate enough to                            assess  for inflammatory changes but small or flat                            lesions may not have been appreciated in some                            portions of the colon.                           Biopsies were taken with a cold forceps for                            histology in the right, transverse, and left colon. Complications:            No immediate complications. Estimated blood loss:  Minimal. Estimated Blood Loss:     Estimated blood loss was minimal. Impression:               - The examined portion of the ileum was normal.                           - Diverticulosis in the entire examined colon.                           - Very focal localized mild inflammation was found                            in the transverse colon. Biopsied.                           - One 4 mm polyp in the distal rectum, removed with                            a cold biopsy forceps. Resected and retrieved.                           - Residual stool burden noted as above - small or                            flat lesions may not have been appreciated in                            certain portions of the colon                           - The examination was otherwise normal.                           - Biopsies were taken with a cold forceps for                            histology throughout the colon.                           Generally, good control of colitis on Entyvio - on                            focal small area of mild inflammation. Recommendation:           - Patient has a contact number available for                            emergencies. The signs and symptoms of potential                            delayed complications were discussed with the                            patient. Return to normal activities tomorrow.  Written discharge instructions were provided to the                            patient.                           - Resume  previous diet.                           - Continue present medications.                           - Await pathology results.                           - Consideration for checking Entyvio trough level                            prior to the next Entyvio infusion, will discuss                            with the patient Carlota Raspberry. Sharna Gabrys, MD 12/26/2021 11:20:49 AM This report has been signed electronically.

## 2021-12-26 NOTE — Patient Instructions (Signed)
Handouts on polyps and diverticulosis given to patient.  Await pathology results. Resume previous diet and continue present medications. Repeat colonoscopy for surveillance will be determined based off of pathology results. Consideration for checking Entyvio trough level prior to the next Entyvio infusion, will discuss with the patient   YOU HAD AN ENDOSCOPIC PROCEDURE TODAY AT [place]:   Refer to the procedure report that was given to you for any specific questions about what was found during the examination.  If the procedure report does not answer your questions, please call your gastroenterologist to clarify.  If you requested that your care partner not be given the details of your procedure findings, then the procedure report has been included in a sealed envelope for you to review at your convenience later.  YOU SHOULD EXPECT: Some feelings of bloating in the abdomen. Passage of more gas than usual.  Walking can help get rid of the air that was put into your GI tract during the procedure and reduce the bloating. If you had a lower endoscopy (such as a colonoscopy or flexible sigmoidoscopy) you may notice spotting of blood in your stool or on the toilet paper. If you underwent a bowel prep for your procedure, you may not have a normal bowel movement for a few days.  Please Note:  You might notice some irritation and congestion in your nose or some drainage.  This is from the oxygen used during your procedure.  There is no need for concern and it should clear up in a day or so.  SYMPTOMS TO REPORT IMMEDIATELY:  Following lower endoscopy (colonoscopy or flexible sigmoidoscopy):  Excessive amounts of blood in the stool  Significant tenderness or worsening of abdominal pains  Swelling of the abdomen that is new, acute  Fever of 100F or higher  For urgent or emergent issues, a gastroenterologist can be reached at any hour by calling [phone]. Do not use MyChart  messaging for urgent concerns.    DIET:  We do recommend a small meal at first, but then you may proceed to your regular diet.  Drink plenty of fluids but you should avoid alcoholic beverages for 24 hours.  ACTIVITY:  You should plan to take it easy for the rest of today and you should NOT DRIVE or use heavy machinery until tomorrow (because of the sedation medicines used during the test).    FOLLOW UP: Our staff will call the number listed on your records 48-72 hours following your procedure to check on you and address any questions or concerns that you may have regarding the information given to you following your procedure. If we do not reach you, we will leave a message.  We will attempt to reach you two times.  During this call, we will ask if you have developed any symptoms of COVID 19. If you develop any symptoms (ie: fever, flu-like symptoms, shortness of breath, cough etc.) before then, please call [phone].  If you test positive for Covid 19 in the 2 weeks post procedure, please call and report this information to [name].    If any biopsies were taken you will be contacted by phone or by letter within the next 1-3 weeks.  Please call us at [phone] if you have not heard about the biopsies in 3 weeks.    SIGNATURES/CONFIDENTIALITY: You and/or your care partner have signed paperwork which will be entered into your electronic medical record.  These signatures attest to the fact that that  the information above on your After Visit Summary has been reviewed and is understood.  Full responsibility of the confidentiality of this discharge information lies with you and/or your care-partner.

## 2021-12-26 NOTE — Progress Notes (Signed)
[facility] History and Physical   Primary Care Physician:  [name], NP   Reason for Procedure:   Indeterminate colitis - suspected Crohn's colitis  Plan:    colonoscopy     HPI: SHENICKA SUNDERLIN is a 66 y.o. female  here for colonoscopy surveillance of colitis. On entyvio since Sept 2022 and doing pretty well. Patient denies any bowel symptoms at this time.  Otherwise feels well without any cardiopulmonary symptoms. She wants to proceed after discussion of risks / benefits.   Past Medical History:  Diagnosis Date   Anemia    Anxiety    Arthritis    Blood transfusion without reported diagnosis    Cataract    CHF (congestive heart failure) ([place])    Chronic kidney disease    UTI, hematuria in urine   Colitis    Crohn's disease ([place])    Depression    Diabetes ([place])    Diverticulosis    Frequent headaches    Interstitial cystitis    Recurrent UTI    Restless leg syndrome    TIA (transient ischemic attack)    more than year ago per pt 12-26-21   Urinary frequency     Past Surgical History:  Procedure Laterality Date   bariatric bypass  2012   BIOPSY  05/03/2020   Procedure: BIOPSY;  Surgeon: Milus Banister, MD;  Location: [place];  Service: Endoscopy;;   CARPAL TUNNEL RELEASE Right 2003   CARPAL TUNNEL RELEASE Right    2008   CHOLECYSTECTOMY  1975   COLONOSCOPY     COLONOSCOPY WITH PROPOFOL N/A 05/03/2020   Procedure: COLONOSCOPY WITH PROPOFOL;  Surgeon: Milus Banister, MD;  Location: [place];  Service: Endoscopy;  Laterality: N/A;   CYSTOSCOPY W/ RETROGRADES Bilateral 06/06/2015   Procedure: CYSTOSCOPY WITH RETROGRADE PYELOGRAM;  Surgeon: Festus Aloe, MD;  Location: [facility];  Service: Urology;  Laterality: Bilateral;   FL INJ LEFT KNEE CT ARTHROGRAM ([facility] HX) Left    1995   GASTRIC BYPASS  2010   HAND SURGERY Left 01/19/2021   Thumb   HEMORRHOID SURGERY  2013   KNEE ARTHROSCOPY Left 1996   TONSILLECTOMY     TOTAL  ABDOMINAL HYSTERECTOMY W/ BILATERAL SALPINGOOPHORECTOMY      Prior to Admission medications   Medication Sig Start Date End Date Taking? Authorizing Provider  ALPRAZolam Duanne Moron) 1 MG tablet Take 1 tablet (1 mg total) by mouth 3 (three) times daily as needed. for anxiety 11/30/21  Yes Hurst, Teresa T, PA-C  buPROPion (WELLBUTRIN XL) 150 MG 24 hr tablet Take 3 tablets (450 mg total) by mouth daily. 11/30/21  Yes Donnal Moat T, PA-C  furosemide (LASIX) 20 MG tablet Take 1 tablet (20 mg total) by mouth daily. May take an additional tablet (16m) AS NEEDED for weight gain and/or swelling. 06/07/21 06/02/22 Yes End, CHarrell Gave MD  glipiZIDE (GLUCOTROL) 5 MG tablet Take 1 tablet by mouth twice daily. 02/20/21  Yes HJon Billings NP  metFORMIN (GLUCOPHAGE) 1000 MG tablet Take 1 tablet by mouth twice daily. 02/20/21  Yes HJon Billings NP  pramipexole (MIRAPEX) 1 MG tablet TAKE 1 TABLET BY MOUTH AT BEDTIME 09/18/21  Yes HJon Billings NP  QUEtiapine (SEROQUEL) 300 MG tablet Take 1 tablet (300 mg total) by mouth at bedtime. 12/05/21  Yes HDonnal MoatT, PA-C  sertraline (ZOLOFT) 100 MG tablet Take 2 tablets (200 mg total) by mouth daily. 08/02/21  Yes Hurst, TDorothea Glassman PA-C  Cholecalciferol (VITAMIN D) 50 MCG (  2000 UT) tablet Take 2 tablets (4,000 Units total) by mouth daily. Patient not taking: Reported on 11/30/2021 11/15/21   [name] Oki, Carlota Raspberry, MD  dicyclomine (BENTYL) 10 MG capsule Take 1 capsule (10 mg total) by mouth 3 (three) times daily as needed for spasms. Patient not taking: Reported on 11/30/2021 03/30/21   Esterwood, Amy S, PA-C  gabapentin (NEURONTIN) 100 MG capsule Take 100 mg by mouth 2 (two) times daily as needed. Patient not taking: Reported on 11/30/2021 03/07/21   [provider]  lamoTRIgine (LAMICTAL) 200 MG tablet Take 2 tablets (400 mg total) by mouth at bedtime. 03/02/21   Donnal Moat T, PA-C  loperamide (IMODIUM A-D) 2 MG tablet Take 1 tablet (2 mg total) by mouth as needed  for diarrhea or loose stools. Patient not taking: Reported on 11/30/2021 11/15/21   Yetta Flock, MD  ondansetron (ZOFRAN-ODT) 4 MG disintegrating tablet Take 1 tablet (4 mg total) by mouth every 8 (eight) hours as needed for nausea or vomiting. Please keep your upcoming appointment in March for further refills. Patient not taking: Reported on 12/26/2021 09/11/21   Yetta Flock, MD  TRULICITY 9.65 YO/3.7CH SOPN INJECT 1 DOSE (0.75MG)  SUBCUTANEOUSLY ONCE A WEEK 09/12/21   [name], NP  vedolizumab (ENTYVIO) 300 MG injection Inject into the vein every 8 (eight) weeks.    [provider]  Vitamin D, Ergocalciferol, (DRISDOL) 1.25 MG (50000 UNIT) CAPS capsule Take 1 capsule (50,000 Units total) by mouth every 7 (seven) days. Patient not taking: Reported on 12/26/2021 11/30/21   Addison Lank, PA-C    Current Outpatient Medications  Medication Sig Dispense Refill   ALPRAZolam (XANAX) 1 MG tablet Take 1 tablet (1 mg total) by mouth 3 (three) times daily as needed. for anxiety 90 tablet 5   buPROPion (WELLBUTRIN XL) 150 MG 24 hr tablet Take 3 tablets (450 mg total) by mouth daily. 270 tablet 3   furosemide (LASIX) 20 MG tablet Take 1 tablet (20 mg total) by mouth daily. May take an additional tablet (45m) AS NEEDED for weight gain and/or swelling. 90 tablet 3   glipiZIDE (GLUCOTROL) 5 MG tablet Take 1 tablet by mouth twice daily. 180 tablet 1   metFORMIN (GLUCOPHAGE) 1000 MG tablet Take 1 tablet by mouth twice daily. 180 tablet 1   pramipexole (MIRAPEX) 1 MG tablet TAKE 1 TABLET BY MOUTH AT BEDTIME 90 tablet 1   QUEtiapine (SEROQUEL) 300 MG tablet Take 1 tablet (300 mg total) by mouth at bedtime. 90 tablet 1   sertraline (ZOLOFT) 100 MG tablet Take 2 tablets (200 mg total) by mouth daily. 60 tablet 11   Cholecalciferol (VITAMIN D) 50 MCG (2000 UT) tablet Take 2 tablets (4,000 Units total) by mouth daily. (Patient not taking: Reported on 11/30/2021)     dicyclomine (BENTYL) 10  MG capsule Take 1 capsule (10 mg total) by mouth 3 (three) times daily as needed for spasms. (Patient not taking: Reported on 11/30/2021) 90 capsule 4   gabapentin (NEURONTIN) 100 MG capsule Take 100 mg by mouth 2 (two) times daily as needed. (Patient not taking: Reported on 11/30/2021)     lamoTRIgine (LAMICTAL) 200 MG tablet Take 2 tablets (400 mg total) by mouth at bedtime. 60 tablet 11   loperamide (IMODIUM A-D) 2 MG tablet Take 1 tablet (2 mg total) by mouth as needed for diarrhea or loose stools. (Patient not taking: Reported on 11/30/2021) 30 tablet 0   ondansetron (ZOFRAN-ODT) 4 MG disintegrating tablet Take  1 tablet (4 mg total) by mouth every 8 (eight) hours as needed for nausea or vomiting. Please keep your upcoming appointment in March for further refills. (Patient not taking: Reported on 12/26/2021) 30 tablet 0   TRULICITY 7.90 WI/0.9BD SOPN INJECT 1 DOSE (0.75MG)  SUBCUTANEOUSLY ONCE A WEEK 6 mL 0   vedolizumab (ENTYVIO) 300 MG injection Inject into the vein every 8 (eight) weeks.     Vitamin D, Ergocalciferol, (DRISDOL) 1.25 MG (50000 UNIT) CAPS capsule Take 1 capsule (50,000 Units total) by mouth every 7 (seven) days. (Patient not taking: Reported on 12/26/2021) 12 capsule 0   Current Facility-Administered Medications  Medication Dose Route Frequency Provider Last Rate Last Admin   0.9 %  sodium chloride infusion  500 mL Intravenous Once Jolita Haefner, Carlota Raspberry, MD        Allergies as of 12/26/2021 - Review Complete 12/26/2021  Allergen Reaction Noted   Avelox [moxifloxacin hcl in nacl] Anaphylaxis 04/19/2015   Bactrim [sulfamethoxazole-trimethoprim] Anaphylaxis 06/17/2018   Ciprofloxacin Other (See Comments) 10/15/2018   Depakote [divalproex sodium] Other (See Comments) 06/01/2018   Imitrex [sumatriptan] Other (See Comments) 05/21/2017   Stadol [butorphanol] Rash 04/19/2015    Family History  Problem Relation Age of Onset   Stroke Father    Colon cancer Mother    Heart failure  Sister    Bladder Cancer Neg Hx    Kidney disease Neg Hx    Prostate cancer Neg Hx    Kidney cancer Neg Hx    Pancreatic cancer Neg Hx    Esophageal cancer Neg Hx    Stomach cancer Neg Hx    Rectal cancer Neg Hx     Social History   Socioeconomic History   Marital status: Married    Spouse name: Not on file   Number of children: Not on file   Years of education: Not on file   Highest education level: Not on file  Occupational History   Not on file  Tobacco Use   Smoking status: Former    Packs/day: 0.50    Types: Cigarettes    Quit date: 04/25/1975    Years since quitting: 46.7   Smokeless tobacco: Never   Tobacco comments:    quit 40 years  Vaping Use   Vaping Use: Never used  Substance and Sexual Activity   Alcohol use: No    Alcohol/week: 0.0 standard drinks   Drug use: No   Sexual activity: Not Currently    Birth control/protection: Post-menopausal, Surgical  Other Topics Concern   Not on file  Social History Narrative   Caffeine 5 servings per day.   Social Determinants of Health   Financial Resource Strain: Medium Risk   Difficulty of Paying Living Expenses: Somewhat hard  Food Insecurity: No Food Insecurity   Worried About Charity fundraiser in the Last Year: Never true   Ran Out of Food in the Last Year: Never true  Transportation Needs: No Transportation Needs   Lack of Transportation (Medical): No   Lack of Transportation (Non-Medical): No  Physical Activity: Inactive   Days of Exercise per Week: 0 days   Minutes of Exercise per Session: 0 min  Stress: Stress Concern Present   Feeling of Stress : To some extent  Social Connections: Moderately Integrated   Frequency of Communication with Friends and Family: More than three times a week   Frequency of Social Gatherings with Friends and Family: More than three times a week   Attends Religious Services:  More than 4 times per year   Active Member of Clubs or Organizations: No   Attends Theatre manager Meetings: Never   Marital Status: Married  Human resources officer Violence: Not At Risk   Fear of Current or Ex-Partner: No   Emotionally Abused: No   Physically Abused: No   Sexually Abused: No    Review of Systems: All other review of systems negative except as mentioned in the HPI.  Physical Exam: Vital signs BP 119/77   Pulse 77   Temp (!) 97.5 F (36.4 C) (Skin)   Ht 5' (1.524 m)   Wt 140 lb (63.5 kg)   LMP  (LMP Unknown)   SpO2 97%   BMI 27.34 kg/m   General:   Alert,  Well-developed, pleasant and cooperative in NAD Lungs:  Clear throughout to auscultation.   Heart:  Regular rate and rhythm Abdomen:  Soft, nontender and nondistended.   Neuro/Psych:  Alert and cooperative. Normal mood and affect. A and O x 3  Jolly Mango, MD [facility]

## 2021-12-27 ENCOUNTER — Telehealth: Payer: Self-pay

## 2021-12-27 NOTE — Telephone Encounter (Signed)
  Follow up Call-     12/26/2021    9:50 AM 10/06/2020    3:48 PM  Call back number  Post procedure Call Back phone  # [phone]  Permission to leave phone message Yes Yes     Patient questions:  Do you have a fever, pain , or abdominal swelling? No. Pain Score  0 *  Have you tolerated food without any problems? Yes.    Have you been able to return to your normal activities? Yes.    Do you have any questions about your discharge instructions: Diet   No. Medications  No. Follow up visit  No.  Do you have questions or concerns about your Care? No.  Actions: * If pain score is 4 or above: No action needed, pain <4.

## 2022-01-03 ENCOUNTER — Other Ambulatory Visit: Payer: Self-pay

## 2022-01-03 DIAGNOSIS — K50119 Crohn's disease of large intestine with unspecified complications: Secondary | ICD-10-CM

## 2022-01-04 ENCOUNTER — Other Ambulatory Visit: Payer: Self-pay

## 2022-01-04 DIAGNOSIS — R7989 Other specified abnormal findings of blood chemistry: Secondary | ICD-10-CM

## 2022-01-04 DIAGNOSIS — K50119 Crohn's disease of large intestine with unspecified complications: Secondary | ICD-10-CM

## 2022-01-04 DIAGNOSIS — D509 Iron deficiency anemia, unspecified: Secondary | ICD-10-CM

## 2022-01-11 ENCOUNTER — Ambulatory Visit: Payer: BC Managed Care – PPO

## 2022-01-17 ENCOUNTER — Other Ambulatory Visit (INDEPENDENT_AMBULATORY_CARE_PROVIDER_SITE_OTHER): Payer: BC Managed Care – PPO

## 2022-01-17 ENCOUNTER — Ambulatory Visit (INDEPENDENT_AMBULATORY_CARE_PROVIDER_SITE_OTHER): Payer: BC Managed Care – PPO | Admitting: *Deleted

## 2022-01-17 ENCOUNTER — Ambulatory Visit: Payer: BC Managed Care – PPO

## 2022-01-17 VITALS — BP 150/75 | HR 93 | Temp 97.7°F | Resp 18 | Ht 60.0 in | Wt 147.0 lb

## 2022-01-17 DIAGNOSIS — K5100 Ulcerative (chronic) pancolitis without complications: Secondary | ICD-10-CM | POA: Diagnosis not present

## 2022-01-17 DIAGNOSIS — Z5181 Encounter for therapeutic drug level monitoring: Secondary | ICD-10-CM | POA: Diagnosis not present

## 2022-01-17 DIAGNOSIS — D509 Iron deficiency anemia, unspecified: Secondary | ICD-10-CM | POA: Diagnosis not present

## 2022-01-17 DIAGNOSIS — R7989 Other specified abnormal findings of blood chemistry: Secondary | ICD-10-CM

## 2022-01-17 DIAGNOSIS — K50119 Crohn's disease of large intestine with unspecified complications: Secondary | ICD-10-CM

## 2022-01-17 LAB — HEPATIC FUNCTION PANEL
ALT: 26 U/L (ref 0–35)
AST: 23 U/L (ref 0–37)
Albumin: 3.8 g/dL (ref 3.5–5.2)
Alkaline Phosphatase: 119 U/L — ABNORMAL HIGH (ref 39–117)
Bilirubin, Direct: 0.1 mg/dL (ref 0.0–0.3)
Total Bilirubin: 0.3 mg/dL (ref 0.2–1.2)
Total Protein: 7.1 g/dL (ref 6.0–8.3)

## 2022-01-17 LAB — IBC + FERRITIN
Ferritin: 8.4 ng/mL — ABNORMAL LOW (ref 10.0–291.0)
Iron: 60 ug/dL (ref 42–145)
Saturation Ratios: 10.5 % — ABNORMAL LOW (ref 20.0–50.0)
TIBC: 571.2 ug/dL — ABNORMAL HIGH (ref 250.0–450.0)
Transferrin: 408 mg/dL — ABNORMAL HIGH (ref 212.0–360.0)

## 2022-01-17 LAB — CK: Total CK: 63 U/L (ref 7–177)

## 2022-01-17 MED ORDER — VEDOLIZUMAB 300 MG IV SOLR
300.0000 mg | Freq: Once | INTRAVENOUS | Status: AC
  Administered 2022-01-17: 300 mg via INTRAVENOUS
  Filled 2022-01-17: qty 5

## 2022-01-17 NOTE — Progress Notes (Deleted)
Diagnosis: Crohn's Disease  Provider:  Marshell Garfinkel, MD  Procedure: Infusion  IV Type: Peripheral, IV Location: L Antecubital  Entyvio (Vedolizumab), Dose: 300 mg  Infusion Start Time: 9295  Infusion Stop Time: 1200  Post Infusion IV Care: Peripheral IV Discontinued  Discharge: Condition: Good, Destination: Home . AVS provided to patient.   Performed by:  Adelina Mings, LPN

## 2022-01-17 NOTE — Progress Notes (Signed)
Diagnosis: Crohn's Disease  Provider:  Marshell Garfinkel, MD  Procedure: Infusion  IV Type: Peripheral, IV Location: L Antecubital  Entyvio (Vedolizumab), Dose: 300 mg  Infusion Start Time: 1125 am  Infusion Stop Time: 1200 n  Post Infusion IV Care: Observation period completed and Peripheral IV Discontinued  Discharge: Condition: Good, Destination: Home . AVS provided to patient.   Performed by:  Oren Beckmann, RN

## 2022-01-18 ENCOUNTER — Ambulatory Visit: Payer: Self-pay

## 2022-01-18 ENCOUNTER — Ambulatory Visit: Payer: BC Managed Care – PPO | Admitting: Internal Medicine

## 2022-01-18 ENCOUNTER — Encounter: Payer: Self-pay | Admitting: Internal Medicine

## 2022-01-18 VITALS — BP 124/84 | HR 93 | Temp 98.1°F | Ht 60.0 in | Wt 144.6 lb

## 2022-01-18 DIAGNOSIS — R413 Other amnesia: Secondary | ICD-10-CM | POA: Insufficient documentation

## 2022-01-18 NOTE — Telephone Encounter (Signed)
  Chief Complaint: Memory issues, confusion, leg tingling Symptoms: ibid Frequency: Last normal 3 years ago.  Pertinent Negatives: Patient denies Chest pain SOB Disposition: [] ED /[] Urgent Care (no appt availability in office) / [x] Appointment(In office/virtual)/ []  [facility]/ [] Home Care/ [] Refused Recommended Disposition /[] [facility]/ []  Follow-up with PCP Additional Notes: Pt states that last normal was 3 years ago. In the past 3 months she had had severe HA, confusion, memory loss and intermittent leg tingling. Pt does not want to go to ED.   PT would also like refill of gabapentin.   Reason for Disposition  [1] Numbness or tingling in one or both hands AND [2] is a chronic symptom (recurrent or ongoing AND present > 4 weeks)  Answer Assessment - Initial Assessment Questions 1. SYMPTOM: "What is the main symptom you are concerned about?" (e.g., weakness, numbness)     Memory loss confusion 2. ONSET: "When did this start?" (minutes, hours, days; while sleeping)      3 months ago - and  getting worse   3. LAST NORMAL: "When was the last time you (the patient) were normal (no symptoms)?"     3 years ago 4. PATTERN "Does this come and go, or has it been constant since it started?"  "Is it present now?"     Come and goes 5. CARDIAC SYMPTOMS: "Have you had any of the following symptoms: chest pain, difficulty breathing, palpitations?"     no 6. NEUROLOGIC SYMPTOMS: "Have you had any of the following symptoms: headache, dizziness, vision loss, double vision, changes in speech, unsteady on your feet?"      7. OTHER SYMPTOMS: "Do you have any other symptoms?"     Back pain 8. PREGNANCY: "Is there any chance you are pregnant?" "When was your last menstrual period?"     na  Protocols used: Neurologic Deficit-A-AH

## 2022-01-18 NOTE — Progress Notes (Signed)
BP 124/84   Pulse 93   Temp 98.1 F (36.7 C) (Oral)   Ht 5' (1.524 m)   Wt 144 lb 9.6 oz (65.6 kg)   LMP  (LMP Unknown)   SpO2 97%   BMI 28.24 kg/m    Subjective:    Patient ID: Amy Hansen, female    DOB: [birth year], 66 y.o.   MRN: [id number]  Chief Complaint  Patient presents with  . Headache    For the past week only taking Tylenol. States she has been confused, and clumsy. Patient is stating that she feels like something is off with her and is feeling irritated.  . Altered Mental Status  . Memory Loss    HPI: Amy Hansen is a 66 y.o. female  Had some memory loss x last  Had an appt for an infusion couldn't get there in time but couldn't find it. She had to call and tell them she has been having memory changes.  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 Has been seeing neurology    Chief Complaint  Patient presents with  . Headache    For the past week only taking Tylenol. States she has been confused, and clumsy. Patient is stating that she feels like something is off with her and is feeling irritated.  . Altered Mental Status  . Memory Loss    Relevant past medical, surgical, family and social history reviewed and updated as indicated. Interim medical history since our last visit reviewed. Allergies and medications reviewed and updated.  Review of Systems  Neurological:  Negative for dizziness, seizures, syncope, facial asymmetry, light-headedness, numbness and headaches.  Psychiatric/Behavioral:  Positive for confusion and sleep disturbance. Negative for agitation and behavioral problems. The patient is not nervous/anxious.     Per HPI unless specifically indicated above     Objective:    BP 124/84   Pulse 93   Temp 98.1 F (36.7 C) (Oral)   Ht 5' (1.524 m)   Wt 144 lb 9.6 oz (65.6 kg)   LMP  (LMP Unknown)   SpO2 97%   BMI 28.24 kg/m   Wt Readings from Last 3 Encounters:  01/18/22 144 lb 9.6 oz (65.6 kg)  01/17/22 147 lb (66.7 kg)   12/26/21 140 lb (63.5 kg)    Physical Exam Vitals and nursing note reviewed.  Constitutional:      General: She is not in acute distress.    Appearance: Normal appearance. She is not ill-appearing or diaphoretic.  Eyes:     Conjunctiva/sclera: Conjunctivae normal.  Cardiovascular:     Rate and Rhythm: Normal rate and regular rhythm.  Skin:    General: Skin is warm and dry.     Coloration: Skin is not jaundiced.     Findings: No erythema.  Neurological:     Mental Status: She is alert.     Cranial Nerves: No cranial nerve deficit.     Motor: Weakness present.     Comments: On right lower ext weakness noted 4/5    Results for orders placed or performed in visit on 01/17/22  CK (Creatine Kinase)  Result Value Ref Range   Total CK 63 7 - 177 U/L  IgG  Result Value Ref Range   IgG (Immunoglobin G), Serum 1,220 600 - 1,540 mg/dL  Hepatic function panel  Result Value Ref Range   Total Bilirubin 0.3 0.2 - 1.2 mg/dL   Bilirubin, Direct 0.1 0.0 - 0.3  mg/dL   Alkaline Phosphatase 119 (H) 39 - 117 U/L   AST 23 0 - 37 U/L   ALT 26 0 - 35 U/L   Total Protein 7.1 6.0 - 8.3 g/dL   Albumin 3.8 3.5 - 5.2 g/dL  IBC + Ferritin  Result Value Ref Range   Iron 60 42 - 145 ug/dL   Transferrin 408.0 (H) 212.0 - 360.0 mg/dL   Saturation Ratios 10.5 (L) 20.0 - 50.0 %   Ferritin 8.4 (L) 10.0 - 291.0 ng/mL   TIBC 571.2 (H) 250.0 - 450.0 mcg/dL        Current Outpatient Medications:  .  ALPRAZolam (XANAX) 1 MG tablet, Take 1 tablet (1 mg total) by mouth 3 (three) times daily as needed. for anxiety, Disp: 90 tablet, Rfl: 5 .  buPROPion (WELLBUTRIN XL) 150 MG 24 hr tablet, Take 3 tablets (450 mg total) by mouth daily., Disp: 270 tablet, Rfl: 3 .  furosemide (LASIX) 20 MG tablet, Take 1 tablet (20 mg total) by mouth daily. May take an additional tablet (74m) AS NEEDED for weight gain and/or swelling., Disp: 90 tablet, Rfl: 3 .  glipiZIDE (GLUCOTROL) 5 MG tablet, Take 1 tablet by mouth twice  daily., Disp: 180 tablet, Rfl: 1 .  lamoTRIgine (LAMICTAL) 200 MG tablet, Take 2 tablets (400 mg total) by mouth at bedtime., Disp: 60 tablet, Rfl: 11 .  metFORMIN (GLUCOPHAGE) 1000 MG tablet, Take 1 tablet by mouth twice daily., Disp: 180 tablet, Rfl: 1 .  pramipexole (MIRAPEX) 1 MG tablet, TAKE 1 TABLET BY MOUTH AT BEDTIME, Disp: 90 tablet, Rfl: 1 .  QUEtiapine (SEROQUEL) 300 MG tablet, Take 1 tablet (300 mg total) by mouth at bedtime., Disp: 90 tablet, Rfl: 1 .  sertraline (ZOLOFT) 100 MG tablet, Take 2 tablets (200 mg total) by mouth daily., Disp: 60 tablet, Rfl: 11 .  TRULICITY 00.86MVH/8.4ONSOPN, INJECT 1 DOSE (0.75MG)  SUBCUTANEOUSLY ONCE A WEEK, Disp: 6 mL, Rfl: 0 .  vedolizumab (ENTYVIO) 300 MG injection, Inject into the vein every 8 (eight) weeks., Disp: , Rfl:  .  Cholecalciferol (VITAMIN D) 50 MCG (2000 UT) tablet, Take 2 tablets (4,000 Units total) by mouth daily. (Patient not taking: Reported on 11/30/2021), Disp: , Rfl:  .  dicyclomine (BENTYL) 10 MG capsule, Take 1 capsule (10 mg total) by mouth 3 (three) times daily as needed for spasms. (Patient not taking: Reported on 11/30/2021), Disp: 90 capsule, Rfl: 4 .  gabapentin (NEURONTIN) 100 MG capsule, Take 100 mg by mouth 2 (two) times daily as needed. (Patient not taking: Reported on 11/30/2021), Disp: , Rfl:  .  loperamide (IMODIUM A-D) 2 MG tablet, Take 1 tablet (2 mg total) by mouth as needed for diarrhea or loose stools. (Patient not taking: Reported on 11/30/2021), Disp: 30 tablet, Rfl: 0 .  mupirocin ointment (BACTROBAN) 2 %, mupirocin 2 % topical ointment  APPLY TOPICALLY ONCE DAILY, Disp: , Rfl:  .  ondansetron (ZOFRAN-ODT) 4 MG disintegrating tablet, Take 1 tablet (4 mg total) by mouth every 8 (eight) hours as needed for nausea or vomiting. Please keep your upcoming appointment in March for further refills. (Patient not taking: Reported on 12/26/2021), Disp: 30 tablet, Rfl: 0 .  Vitamin D, Ergocalciferol, (DRISDOL) 1.25 MG (50000 UNIT)  CAPS capsule, Take 1 capsule (50,000 Units total) by mouth every 7 (seven) days. (Patient not taking: Reported on 12/26/2021), Disp: 12 capsule, Rfl: 0    Assessment & Plan:  Dementia :check B12, folate, TSH, RPR. Patient  will be referred to Neurology appropriate paperwork given to caregiver with information regarding the care for patient with dementia. Patient probably has had severe dementia for a while now. Not been treated for such.     Problem List Items Addressed This Visit       Other   Memory change - Primary   Relevant Orders   Ambulatory referral to Neurology   RPR   Vitamin B12   Folate   CBC with Differential/Platelet   HIV antibody (with reflex)     Orders Placed This Encounter  Procedures  . RPR  . Vitamin B12  . Folate  . CBC with Differential/Platelet  . HIV antibody (with reflex)  . Ambulatory referral to Neurology     No orders of the defined types were placed in this encounter.    Follow up plan: Return in about 4 weeks (around 02/15/2022).

## 2022-01-19 DIAGNOSIS — M1711 Unilateral primary osteoarthritis, right knee: Secondary | ICD-10-CM | POA: Diagnosis not present

## 2022-01-19 LAB — CBC WITH DIFFERENTIAL/PLATELET
Basophils Absolute: 0 10*3/uL (ref 0.0–0.2)
Basos: 0 %
EOS (ABSOLUTE): 0.1 10*3/uL (ref 0.0–0.4)
Eos: 1 %
Hematocrit: 31.9 % — ABNORMAL LOW (ref 34.0–46.6)
Hemoglobin: 10.1 g/dL — ABNORMAL LOW (ref 11.1–15.9)
Immature Grans (Abs): 0.1 10*3/uL (ref 0.0–0.1)
Immature Granulocytes: 1 %
Lymphocytes Absolute: 1.7 10*3/uL (ref 0.7–3.1)
Lymphs: 30 %
MCH: 25.9 pg — ABNORMAL LOW (ref 26.6–33.0)
MCHC: 31.7 g/dL (ref 31.5–35.7)
MCV: 82 fL (ref 79–97)
Monocytes Absolute: 0.4 10*3/uL (ref 0.1–0.9)
Monocytes: 6 %
Neutrophils Absolute: 3.5 10*3/uL (ref 1.4–7.0)
Neutrophils: 62 %
Platelets: 382 10*3/uL (ref 150–450)
RBC: 3.9 x10E6/uL (ref 3.77–5.28)
RDW: 13.4 % (ref 11.7–15.4)
WBC: 5.6 10*3/uL (ref 3.4–10.8)

## 2022-01-19 LAB — VITAMIN B12: Vitamin B-12: 347 pg/mL (ref 232–1245)

## 2022-01-19 LAB — RPR: RPR Ser Ql: NONREACTIVE

## 2022-01-19 LAB — FOLATE: Folate: 10 ng/mL (ref >3.0)

## 2022-01-19 LAB — HIV ANTIBODY (ROUTINE TESTING W REFLEX): HIV Screen 4th Generation wRfx: NONREACTIVE

## 2022-01-23 LAB — ANA: Anti Nuclear Antibody (ANA): NEGATIVE

## 2022-01-23 LAB — IGG: IgG (Immunoglobin G), Serum: 1220 mg/dL (ref 600–1540)

## 2022-01-23 LAB — MITOCHONDRIAL ANTIBODIES: Mitochondrial M2 Ab, IgG: <=20 U (ref <=20.0)

## 2022-01-25 DIAGNOSIS — I739 Peripheral vascular disease, unspecified: Secondary | ICD-10-CM | POA: Diagnosis not present

## 2022-01-25 DIAGNOSIS — S6701XA Crushing injury of right thumb, initial encounter: Secondary | ICD-10-CM | POA: Diagnosis not present

## 2022-01-25 DIAGNOSIS — M189 Osteoarthritis of first carpometacarpal joint, unspecified: Secondary | ICD-10-CM | POA: Diagnosis not present

## 2022-01-25 DIAGNOSIS — S61011A Laceration without foreign body of right thumb without damage to nail, initial encounter: Secondary | ICD-10-CM | POA: Diagnosis not present

## 2022-01-25 LAB — SERIAL MONITORING

## 2022-01-26 ENCOUNTER — Other Ambulatory Visit: Payer: Self-pay | Admitting: Nurse Practitioner

## 2022-01-26 ENCOUNTER — Telehealth: Payer: Self-pay

## 2022-01-26 ENCOUNTER — Encounter: Payer: Self-pay | Admitting: Gastroenterology

## 2022-01-26 ENCOUNTER — Telehealth: Payer: Self-pay | Admitting: Pharmacy Technician

## 2022-01-26 DIAGNOSIS — D509 Iron deficiency anemia, unspecified: Secondary | ICD-10-CM

## 2022-01-26 DIAGNOSIS — M1711 Unilateral primary osteoarthritis, right knee: Secondary | ICD-10-CM | POA: Diagnosis not present

## 2022-01-26 LAB — VEDOLIZUMAB AND ANTI-VEDO AB
Anti-Vedolizumab Antibody: <25 ng/mL
Vedolizumab: 7.7 ug/mL

## 2022-01-26 NOTE — Telephone Encounter (Addendum)
Amy Hansen can you please let the patient know that her Entyvio levels are too low (7.7), ideally we like this > 14 or 15. I'm recommending a dose increase to every 4 weeks if she is willing to adjust this.  Otherwise hopefully she can start iron and do follow up labs. I am also recommending EGD to further evaluate iron deficiency if she is willing, can you please coordinate?  Thanks  ----- Message from Yetta Flock, MD sent at 01/23/2022  6:11 PM EDT ----- Amy Hansen can you relay the following to the patient: -Labs show that her liver enzyme is just above the limit of normal (alk phos 119) but labs done to evaluate this are negative.  We will continue to trend this over time and if rising or persistent may consider additional imaging of her liver, she had it done last year -Her iron levels are quite low, much lower than I would expect given her colon appeared fairly well controlled on Entyvio. -She needs to start ferrous sulfate 325 mg daily, would like to repeat CBC with iron studies and 8 weeks or so. -Given her significant iron deficiency with her colon looking much better under decent control, I am recommending an EGD to further evaluate her stomach and make sure no cause there.  Can you please schedule her for an upper endoscopy if she is willing? -Otherwise I am still waiting her Entyvio levels to determine if we need any dose adjustment, hopefully that will be back soon.  Thanks

## 2022-01-26 NOTE — Telephone Encounter (Signed)
Requested Prescriptions  Pending Prescriptions Disp Refills  . pramipexole (MIRAPEX) 1 MG tablet [Pharmacy Med Name: Pramipexole Dihydrochloride 1 MG Oral Tablet] 90 tablet 0    Sig: TAKE 1 TABLET BY MOUTH AT BEDTIME     Neurology:  Parkinsonian Agents Passed - 01/26/2022  2:31 PM      Passed - Last BP in normal range    BP Readings from Last 1 Encounters:  01/18/22 124/84         Passed - Last Heart Rate in normal range    Pulse Readings from Last 1 Encounters:  01/18/22 93         Passed - Valid encounter within last 12 months    Recent Outpatient Visits          1 week ago Memory change   [facility] Vigg, Avanti, MD   2 months ago Hypertension, unspecified type   [facility] [name], NP   5 months ago Type 2 diabetes mellitus without complication, without long-term current use of insulin ([place])   [facility] [name], NP   8 months ago Poorly controlled type 2 diabetes mellitus ([place])   [facility] [name], NP   9 months ago Poorly controlled type 2 diabetes mellitus ([place])   [facility] [name], NP      Future Appointments            In 3 months [name], NP [facility] [place]

## 2022-01-26 NOTE — Telephone Encounter (Signed)
Pt returned call. We have reviewed her lab results and recommendations as outlined by Dr. Havery Moros. Patient advised to begin Ferrous Sulfate 325 mg daily, she is aware that I will call her in about 8 weeks to remind her of repeat lab work. Pt would like to proceed with EGD. Pt has been scheduled for an EGD in the [facility] on Tuesday, 02/13/22 at 10 am. Pt is aware that she will need to arrive at 9 am with a care partner. I informed pt that I will send her instructions via MyChart and will place a copy in the mail for her. Pt would like to proceed with increasing Entyvio frequency to every 4 weeks. Pt's last infusion was 01/17/22, she will be due for 4 week infusion around 02/14/22. Pt is aware that we will update the order and the infusion center will contact her to adjust her appt. I told patient that I will send all of this to her MyChart since it is a lot of information. Pt knows to contact us with any questions. Pt verbalized understanding and had no concerns at the end of the call.  Lab orders and reminder in epic. Ambulatory referral to GI in epic. EGD instructions sent to pt via MyChart and a copy has been mailed to patient. MyChart message sent to patient with lab results and recommendations. Secure staff message sent to Tresa Res about changing Entyvio frequency to every 4 weeks.

## 2022-01-26 NOTE — Telephone Encounter (Signed)
Order for [facility] every 8 weeks has been discontinued. New order for [facility] every 4 weeks placed.

## 2022-01-26 NOTE — Telephone Encounter (Signed)
Auth Submission: increase in frequency -  pending Payer: [employer] Medication & CPT/J Code(s) submitted: Entyvio (Vedolizumab) [id number] Route of submission (phone, fax, portal): COVER MY MEDS Auth type: Buy/Bill Units/visits requested: 300MG Q4WKS Reference number: [id number] Approval from:  to  at [place] as    Will update once we receive a response.

## 2022-01-26 NOTE — Addendum Note (Signed)
Addended by: Yevette Edwards on: 01/26/2022 12:16 PM   Modules accepted: Orders

## 2022-01-28 ENCOUNTER — Emergency Department: Payer: BC Managed Care – PPO

## 2022-01-28 ENCOUNTER — Encounter: Payer: Self-pay | Admitting: Emergency Medicine

## 2022-01-28 ENCOUNTER — Other Ambulatory Visit: Payer: Self-pay

## 2022-01-28 ENCOUNTER — Emergency Department
Admission: EM | Admit: 2022-01-28 | Discharge: 2022-01-28 | Disposition: A | Discharge status: Home / Self Care | Payer: BC Managed Care – PPO | Attending: Emergency Medicine | Admitting: Emergency Medicine

## 2022-01-28 DIAGNOSIS — R519 Headache, unspecified: Secondary | ICD-10-CM | POA: Insufficient documentation

## 2022-01-28 DIAGNOSIS — H0469 Other changes of lacrimal passages: Secondary | ICD-10-CM

## 2022-01-28 DIAGNOSIS — H04302 Unspecified dacryocystitis of left lacrimal passage: Secondary | ICD-10-CM | POA: Diagnosis not present

## 2022-01-28 DIAGNOSIS — H5712 Ocular pain, left eye: Secondary | ICD-10-CM | POA: Diagnosis not present

## 2022-01-28 LAB — CBC
HCT: 36.6 % (ref 36.0–46.0)
Hemoglobin: 11.2 g/dL — ABNORMAL LOW (ref 12.0–15.0)
MCH: 25.5 pg — ABNORMAL LOW (ref 26.0–34.0)
MCHC: 30.6 g/dL (ref 30.0–36.0)
MCV: 83.2 fL (ref 80.0–100.0)
Platelets: 382 10*3/uL (ref 150–400)
RBC: 4.4 MIL/uL (ref 3.87–5.11)
RDW: 14.9 % (ref 11.5–15.5)
WBC: 5.8 10*3/uL (ref 4.0–10.5)
nRBC: 0 % (ref 0.0–0.2)

## 2022-01-28 LAB — COMPREHENSIVE METABOLIC PANEL
ALT: 26 U/L (ref 0–44)
AST: 19 U/L (ref 15–41)
Albumin: 3.9 g/dL (ref 3.5–5.0)
Alkaline Phosphatase: 107 U/L (ref 38–126)
Anion gap: 10 (ref 5–15)
BUN: 13 mg/dL (ref 8–23)
CO2: 23 mmol/L (ref 22–32)
Calcium: 9.1 mg/dL (ref 8.9–10.3)
Chloride: 100 mmol/L (ref 98–111)
Creatinine, Ser: 0.68 mg/dL (ref 0.44–1.00)
GFR, Estimated: >60 mL/min (ref >60)
Glucose, Bld: 201 mg/dL — ABNORMAL HIGH (ref 70–99)
Potassium: 4.1 mmol/L (ref 3.5–5.1)
Sodium: 133 mmol/L — ABNORMAL LOW (ref 135–145)
Total Bilirubin: 0.6 mg/dL (ref 0.3–1.2)
Total Protein: 7.5 g/dL (ref 6.5–8.1)

## 2022-01-28 MED ORDER — KETOROLAC TROMETHAMINE 30 MG/ML IJ SOLN
15.0000 mg | Freq: Once | INTRAMUSCULAR | Status: AC
  Administered 2022-01-28: 15 mg via INTRAVENOUS
  Filled 2022-01-28: qty 1

## 2022-01-28 MED ORDER — SODIUM CHLORIDE 0.9 % IV BOLUS
1000.0000 mL | Freq: Once | INTRAVENOUS | Status: AC
  Administered 2022-01-28: 1000 mL via INTRAVENOUS

## 2022-01-28 MED ORDER — METOCLOPRAMIDE HCL 5 MG/ML IJ SOLN
10.0000 mg | Freq: Once | INTRAMUSCULAR | Status: AC
  Administered 2022-01-28: 10 mg via INTRAVENOUS
  Filled 2022-01-28: qty 2

## 2022-01-28 MED ORDER — IOHEXOL 300 MG/ML  SOLN
75.0000 mL | Freq: Once | INTRAMUSCULAR | Status: AC | PRN
  Administered 2022-01-28: 75 mL via INTRAVENOUS

## 2022-01-28 MED ORDER — HYDROCODONE-ACETAMINOPHEN 5-325 MG PO TABS: 1.0000 | ORAL_TABLET | ORAL | 0 refills | Status: DC | PRN

## 2022-01-28 MED ORDER — DIPHENHYDRAMINE HCL 50 MG/ML IJ SOLN
25.0000 mg | Freq: Once | INTRAMUSCULAR | Status: AC
  Administered 2022-01-28: 25 mg via INTRAVENOUS
  Filled 2022-01-28: qty 1

## 2022-01-28 NOTE — ED Provider Notes (Signed)
[facility] Provider Note    Event Date/Time   First MD Initiated Contact with Patient 01/28/22 1801     (approximate)  History   Chief Complaint: Headache and Eye Drainage  HPI  TENSLEY WERY is a 66 y.o. female with a past medical history of anemia, anxiety, CHF, diabetes, presents to the emergency department for left-sided headache.  According to the patient over the past several months she has been experiencing increased tearing from the left eye.  Patient was prescribed an eyedrop by her ophthalmologist but continued to have tearing.  Patient states on Monday she pressed just under the left eye which caused pain to the area and ever since she has had increased pain and headache to the left side of her head.  Patient states a history of migraines in the past but states this feels somewhat different.  No fever.  Physical Exam   Triage Vital Signs: ED Triage Vitals  Enc Vitals Group     BP 01/28/22 1443 (!) 151/92     Pulse Rate 01/28/22 1443 (!) 104     Resp 01/28/22 1443 20     Temp 01/28/22 1443 98.6 F (37 C)     Temp Source 01/28/22 1443 Oral     SpO2 01/28/22 1443 96 %     Weight 01/28/22 1441 149 lb (67.6 kg)     Height 01/28/22 1441 5' (1.524 m)     Head Circumference --      Peak Flow --      Pain Score 01/28/22 1441 7     Pain Loc --      Pain Edu? --      Excl. in [place]? --     Most recent vital signs: Vitals:   01/28/22 1443  BP: (!) 151/92  Pulse: (!) 104  Resp: 20  Temp: 98.6 F (37 C)  SpO2: 96%    General: Awake, no distress.  CV:  Good peripheral perfusion.  Regular rate and rhythm  Resp:  Normal effort.  Equal breath sounds bilaterally.  Abd:  No distention.  Soft, nontender.  No rebound or guarding. Other:  No obvious abnormalities of the left eye.  Patient does state tenderness to palpation just below left eye.  No obvious masses palpated.  Extraocular muscles intact, pupils reactive no significant tearing.  No edema or  inflammation or erythema noted.   ED Results / Procedures / Treatments   RADIOLOGY  I viewed and interpreted the CT scan of the head images.  No obvious bleed or large abnormality seen on my evaluation. Radiology is read the CT scan is negative for acute abnormality. 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  MEDICATIONS ORDERED IN ED: Medications  ketorolac (TORADOL) 30 MG/ML injection 15 mg (has no administration in time range)  metoCLOPramide (REGLAN) injection 10 mg (has no administration in time range)  diphenhydrAMINE (BENADRYL) injection 25 mg (has no administration in time range)  sodium chloride 0.9 % bolus 1,000 mL (has no administration in time range)     IMPRESSION / MDM / ASSESSMENT AND PLAN / ED COURSE  I reviewed the triage vital signs and the nursing notes.  Patient's presentation is most consistent with acute presentation with potential threat to life or bodily function.  Patient presents emergency department for worsening pain below her left eye and a left-sided headache.  Differential would include ocular migraine, possible tear duct obstruction, less likely infectious given no erythema  or edema noted.  No fever.  We will check labs obtain CT imaging of the head and maxillofacial to further evaluate.  Patient agreeable to plan.  We will obtain maxillofacial CT with contrast to help further differentiate possible causes.  We will treat the patient's headache with Toradol Reglan Benadryl and IV fluids while awaiting CT imaging.  We will check basic labs and continue to closely monitor.  Patient's lab work shows no significant abnormality.  CBC is normal.  Chemistry is normal.  Patient CT scan however does appear to show a dacryocystocele which is very likely the cause of the patient's discomfort.  Patient is feeling better after medications.  I discussed with the patient using warm compresses to the area and massaging  the area and prompt follow-up with ophthalmology by calling tomorrow to arrange a follow-up appointment as the patient may require intervention of her nasolacrimal duct.  Patient agreeable to plan of care.  FINAL CLINICAL IMPRESSION(S) / ED DIAGNOSES   Left-sided headache Left eye pain Dacryocystocele   Note:  This document was prepared using Dragon voice recognition software and may include unintentional dictation errors.   Harvest Dark, MD 01/28/22 2016

## 2022-01-28 NOTE — ED Notes (Signed)
Dc ppw provided to patient. Follow up and rx information reviewed.  Pt provides verbal consent for dc at this time. Pt assisted to lobby on foot.

## 2022-01-28 NOTE — ED Triage Notes (Signed)
Pt via POV from home. Pt states that a couple months ago she had excess tears on the L eye states that she went to eye doctor and they just gave her some eye drops. States that Friday, she pressed onto her tear duct on the L side and began to have a headache and has had one since then. Pt also endorses blurry vision the L side. Pt is also nausea. Pt states that she also has some photosensitivity but states that she does have a hx of migraine. Pt is A&OX4 and NAD

## 2022-01-28 NOTE — Discharge Instructions (Addendum)
As we discussed please use warm compresses every 2-3 hours to your eye and nose.  You may also gently massage this area.  Please call the number provided for ophthalmology to arrange a follow-up appointment tomorrow morning for further evaluation.  Return to the emergency department for any visual changes, any significant redness or swelling of the face, any fever.

## 2022-01-31 ENCOUNTER — Other Ambulatory Visit: Payer: Self-pay | Admitting: Nurse Practitioner

## 2022-01-31 DIAGNOSIS — Z0100 Encounter for examination of eyes and vision without abnormal findings: Secondary | ICD-10-CM | POA: Diagnosis not present

## 2022-01-31 DIAGNOSIS — H04222 Epiphora due to insufficient drainage, left lacrimal gland: Secondary | ICD-10-CM | POA: Diagnosis not present

## 2022-01-31 LAB — HM DIABETES EYE EXAM

## 2022-02-01 ENCOUNTER — Other Ambulatory Visit: Payer: Self-pay | Admitting: Ophthalmology

## 2022-02-01 ENCOUNTER — Other Ambulatory Visit: Payer: Self-pay | Admitting: Pharmacy Technician

## 2022-02-01 ENCOUNTER — Encounter: Payer: Self-pay | Admitting: Gastroenterology

## 2022-02-01 ENCOUNTER — Encounter: Payer: Self-pay | Admitting: Oncology

## 2022-02-01 DIAGNOSIS — J3489 Other specified disorders of nose and nasal sinuses: Secondary | ICD-10-CM

## 2022-02-01 NOTE — Telephone Encounter (Signed)
Requested Prescriptions  Pending Prescriptions Disp Refills  . TRULICITY 3.15 QM/0.8QP SOPN [Pharmacy Med Name: Trulicity 6.19 JK/9.3OI Subcutaneous Solution Pen-injector] 12 mL 0    Sig: INJECT 0.75 MG SUBCUTANEOUSLY ONCE A WEEK     Endocrinology:  Diabetes - GLP-1 Receptor Agonists Failed - 01/31/2022  6:38 PM      Failed - HBA1C is between 0 and 7.9 and within 180 days    Hemoglobin A1C  Date Value Ref Range Status  03/21/2016 7.3%  Final   HB A1C (BAYER DCA - WAIVED)  Date Value Ref Range Status  10/27/2020 8.7 (H) <7.0 % Final    Comment:                                          Diabetic Adult            <7.0                                       Healthy Adult        4.3 - 5.7                                                           (DCCT/NGSP) American Diabetes Association's Summary of Glycemic Recommendations for Adults with Diabetes: Hemoglobin A1c <7.0%. More stringent glycemic goals (A1c <6.0%) may further reduce complications at the cost of increased risk of hypoglycemia.    Hgb A1c MFr Bld  Date Value Ref Range Status  10/30/2021 9.0 (H) 4.8 - 5.6 % Final    Comment:             Prediabetes: 5.7 - 6.4          Diabetes: >6.4          Glycemic control for adults with diabetes: <7.0          Passed - Valid encounter within last 6 months    Recent Outpatient Visits          2 weeks ago Memory change   [place] V[facility], Avanti, MD   3 months ago Hypertension, unspecified type   [facility] [name], NP   6 months ago Type 2 diabetes mellitus without complication, without long-term current use of insulin ([place])   [facility] [name], NP   8 months ago Poorly controlled type 2 diabetes mellitus ([place])   [facility] [name], NP   9 months ago Poorly controlled type 2 diabetes mellitus ([place])   [facility] [name], NP      Future Appointments            In 2  months [name], NP [facility] [place]

## 2022-02-02 DIAGNOSIS — M1711 Unilateral primary osteoarthritis, right knee: Secondary | ICD-10-CM | POA: Diagnosis not present

## 2022-02-05 NOTE — Telephone Encounter (Signed)
Amy Hansen, pt's last infusion was on 01/17/22. Her August appt will need to be rescheduled to 02/14/22, 4 weeks after the last appt. Thanks

## 2022-02-05 NOTE — Telephone Encounter (Signed)
Noted,  scheduling team is aware.  Thanks.

## 2022-02-07 ENCOUNTER — Encounter: Payer: Self-pay | Admitting: Certified Registered Nurse Anesthetist

## 2022-02-08 ENCOUNTER — Ambulatory Visit: Payer: Self-pay | Admitting: *Deleted

## 2022-02-08 NOTE — Telephone Encounter (Signed)
  Chief Complaint: fell last night , requesting medication refill Symptoms:  patient reaching to turn on light while in bed and lamp fell on arms, patient fell out of bed. Disoriented until she crawled to door to get light in. Reports bruising noted on arms. Denies injury or hitting head. C/o restless legs beoming worse. Has been taking more than prescribed mirapex 1 mg at hs. Takes at times during the day . No longer has refill until 02/21/22. Has been taking more than prescribed > than 2 months for relief of leg pain.  Frequency: last night fall, 2 months restless legs worsening  Pertinent Negatives: Patient denies na Disposition: [] ED /[] Urgent Care (no appt availability in office) / [x] Appointment(In office/virtual)/ []  [facility]/ [] Home Care/ [] Refused Recommended Disposition /[] [place] [facility]/ []  Follow-up with PCP Additional Notes:   Please advise if patient can get refill sooner until see a provider 02/12/22.    Reason for Disposition  Small bruise is present  Answer Assessment - Initial Assessment Questions 1. MECHANISM: "How did the fall happen?"     Fell last night leaning over to turn on lamp and hit arms and patient fell out of bed  2. DOMESTIC VIOLENCE AND ELDER ABUSE SCREENING: "Did you fall because someone pushed you or tried to hurt you?" If Yes, ask: "Are you safe now?"     na 3. ONSET: "When did the fall happen?" (e.g., minutes, hours, or days ago)     Last night  4. LOCATION: "What part of the body hit the ground?" (e.g., back, buttocks, head, hips, knees, hands, head, stomach)     Lamp fell on patient and hit arms  5. INJURY: "Did you hurt (injure) yourself when you fell?" If Yes, ask: "What did you injure? Tell me more about this?" (e.g., body area; type of injury; pain severity)"     Bruising noted to arms  6. PAIN: "Is there any pain?" If Yes, ask: "How bad is the pain?" (e.g., Scale 1-10; or mild,  moderate, severe)   - NONE (0): No pain   -  MILD (1-3): Doesn't interfere with normal activities    - MODERATE (4-7): Interferes with normal activities or awakens from sleep    - SEVERE (8-10): Excruciating pain, unable to do any normal activities      No  7. SIZE: For cuts, bruises, or swelling, ask: "How large is it?" (e.g., inches or centimeters)      Bruising to bilateral arms  8. PREGNANCY: "Is there any chance you are pregnant?" "When was your last menstrual period?"     na 9. OTHER SYMPTOMS: "Do you have any other symptoms?" (e.g., dizziness, fever, weakness; new onset or worsening).      No  10. CAUSE: "What do you think caused the fall (or falling)?" (e.g., tripped, dizzy spell)       Lamp fell on arms  Protocols used: Falls and [facility]

## 2022-02-09 NOTE — Telephone Encounter (Signed)
Patient verbalized understanding  

## 2022-02-09 NOTE — Telephone Encounter (Signed)
Pt requesting med refukk fir mirapex. No longer has refill until 02/21/22. Has been taking more than prescribed > than 2 months for relief of leg pain. Please advise. Next appt 02/12/2022

## 2022-02-09 NOTE — Telephone Encounter (Signed)
Patient can discuss at her visit on the 17th.

## 2022-02-10 NOTE — Patient Instructions (Signed)

## 2022-02-12 ENCOUNTER — Other Ambulatory Visit: Payer: Self-pay | Admitting: Physician Assistant

## 2022-02-12 ENCOUNTER — Ambulatory Visit: Admission: RE | Admit: 2022-02-12 | Payer: BC Managed Care – PPO | Source: Ambulatory Visit

## 2022-02-12 ENCOUNTER — Telehealth: Payer: Self-pay | Admitting: *Deleted

## 2022-02-12 ENCOUNTER — Ambulatory Visit: Payer: BC Managed Care – PPO | Admitting: Nurse Practitioner

## 2022-02-12 ENCOUNTER — Encounter: Payer: Self-pay | Admitting: Nurse Practitioner

## 2022-02-12 VITALS — BP 113/74 | HR 99 | Temp 98.2°F | Ht 60.0 in | Wt 141.0 lb

## 2022-02-12 DIAGNOSIS — E1165 Type 2 diabetes mellitus with hyperglycemia: Secondary | ICD-10-CM

## 2022-02-12 DIAGNOSIS — G2581 Restless legs syndrome: Secondary | ICD-10-CM | POA: Diagnosis not present

## 2022-02-12 DIAGNOSIS — W19XXXA Unspecified fall, initial encounter: Secondary | ICD-10-CM

## 2022-02-12 MED ORDER — ROPINIROLE HCL 1 MG PO TABS: 1.0000 mg | ORAL_TABLET | Freq: Two times a day (BID) | ORAL | 1 refills | Status: DC

## 2022-02-12 NOTE — Telephone Encounter (Signed)
Ok to send

## 2022-02-12 NOTE — Assessment & Plan Note (Signed)
Chronic, ongoing with April A1c 9%.  She wishes not to have labs today, is scheduled with PCP in October.  Have highly recommended she obtain pill box to assist her in remembering night time doses of Metformin and Glipizide, as she is forgetting thi 90% of the time.  At this time continue current medication regimen and adjust as needed.  Check sugars daily.  Focus on diabetic diet.  Return in October as scheduled.

## 2022-02-12 NOTE — Assessment & Plan Note (Signed)
>>  ASSESSMENT AND PLAN FOR DIABETES MELLITUS TYPE 2 IN NONOBESE (HCC) WRITTEN ON 02/12/2022  2:31 PM BY CANNADY, JOLENE T, NP  Chronic, ongoing with April A1c 9%.  She wishes not to have labs today, is scheduled with PCP in October.  Have highly recommended she obtain pill box to assist her in remembering night time doses of Metformin and Glipizide, as she is forgetting thi 90% of the time.  At this time continue current medication regimen and adjust as needed.  Check sugars daily.  Focus on diabetic diet.  Return in October as scheduled.

## 2022-02-12 NOTE — Telephone Encounter (Signed)
Pt states called pharmacy for ropinirole, stated they had not received. NT called pharmacy, stated med was ready for pick up. Called pt to alert med was refilled and ready at preferred pharmacy.

## 2022-02-12 NOTE — Progress Notes (Signed)
BP 113/74   Pulse 99   Temp 98.2 F (36.8 C) (Oral)   Ht 5' (1.524 m)   Wt 141 lb (64 kg)   LMP  (LMP Unknown)   SpO2 96%   BMI 27.54 kg/m    Subjective:    Patient ID: Amy Hansen, female    DOB: [birth year], 66 y.o.   MRN: [id number]  HPI: Amy Hansen is a 66 y.o. female  Chief Complaint  Patient presents with   Restless Leg Syndrome    Patient says she ran out of her medication. Patient says is requesting a refill on her medication. Patient says she thought she had one pill left for her diagnosis and she says went to grab the medication, the lamp fell on her and she fell trying to get up.    Fall   Diabetes    Patient says her sugar level was 196 this morning and just wanted to let her provider aware at today's visit.    RESTLESS LEGS & FALL She reports she ran out of Mirapex, she ran out because she has been taking more then ordered due to symptoms during afternoon time, at nap time.  This is why she ran out of medication.  She went to try to get medication recently, her room is blacked out.  She fell trying to get medication, lamp fell on her.  When she tried to get up to put lamp back she fell, no injuries with this or loss of consciousness. Duration: chronic Discomfort description:  cramping Pain: yes Location: lower legs Bilateral: yes Symmetric: yes Severity: 10/10 Onset:  gradual Frequency:  intermittent Symptoms only occur while legs at rest: yes Sudden unintentional leg jerking: no Bed partner bothered by leg movements: no LE numbness: no Decreased sensation: no Weakness: no Insomnia: no Daytime somnolence: no Fatigue: no Alleviating factors: medication Aggravating factors: resting Status: fluctuating Treatments attempted:  Mirapex  DIABETES Last A1c 9% in April.  Sugar this morning 168. To be taking Metformin 1000 MG BID, Glipizide 5 MG BID, Trulicity 4.09 MG.  Often forgets night time doses, 90% of the time she endorses, but remembers weekly  injection.   Hypoglycemic episodes:no Polydipsia/polyuria: no Visual disturbance: no Chest pain: no Paresthesias: no Glucose Monitoring: yes  Accucheck frequency:  once a week  Fasting glucose:  Post prandial:  Evening:  Before meals: Taking Insulin?: no  Long acting insulin:  Short acting insulin: Blood Pressure Monitoring: not checking Retinal Examination: Up to Date -- Dr. Curley Spice Exam: Up to Date Pneumovax: Up to Date Influenza: Up to Date Aspirin: no   Relevant past medical, surgical, family and social history reviewed and updated as indicated. Interim medical history since our last visit reviewed. Allergies and medications reviewed and updated.  Review of Systems  Constitutional:  Negative for activity change, appetite change, diaphoresis, fatigue and fever.  Respiratory:  Negative for cough, chest tightness and shortness of breath.   Cardiovascular:  Negative for chest pain, palpitations and leg swelling.  Gastrointestinal: Negative.   Endocrine: Negative for polydipsia, polyphagia and polyuria.  Musculoskeletal:  Positive for arthralgias.  Neurological: Negative.   Psychiatric/Behavioral: Negative.      Per HPI unless specifically indicated above     Objective:    BP 113/74   Pulse 99   Temp 98.2 F (36.8 C) (Oral)   Ht 5' (1.524 m)   Wt 141 lb (64 kg)   LMP  (LMP Unknown)   SpO2 96%  BMI 27.54 kg/m   Wt Readings from Last 3 Encounters:  02/12/22 141 lb (64 kg)  01/28/22 149 lb (67.6 kg)  01/18/22 144 lb 9.6 oz (65.6 kg)    Physical Exam Vitals and nursing note reviewed.  Constitutional:      General: She is awake. She is not in acute distress.    Appearance: She is well-developed and well-groomed. She is not ill-appearing or toxic-appearing.  HENT:     Head: Normocephalic.     Right Ear: Hearing normal.     Left Ear: Hearing normal.  Eyes:     General: Lids are normal.        Right eye: No discharge.        Left eye: No discharge.      Conjunctiva/sclera: Conjunctivae normal.     Pupils: Pupils are equal, round, and reactive to light.  Neck:     Thyroid: No thyromegaly.     Vascular: No carotid bruit.  Cardiovascular:     Rate and Rhythm: Normal rate and regular rhythm.     Heart sounds: Normal heart sounds. No murmur heard.    No gallop.  Pulmonary:     Effort: Pulmonary effort is normal. No accessory muscle usage or respiratory distress.     Breath sounds: Normal breath sounds.  Abdominal:     General: Bowel sounds are normal.     Palpations: Abdomen is soft. There is no hepatomegaly or splenomegaly.  Musculoskeletal:     Cervical back: Normal range of motion and neck supple.     Right lower leg: No edema.     Left lower leg: No edema.  Skin:    General: Skin is warm and dry.  Neurological:     Mental Status: She is alert and oriented to person, place, and time.     Deep Tendon Reflexes: Reflexes are normal and symmetric.     Reflex Scores:      Brachioradialis reflexes are 2+ on the right side and 2+ on the left side.      Patellar reflexes are 2+ on the right side and 2+ on the left side. Psychiatric:        Attention and Perception: Attention normal.        Mood and Affect: Mood normal.        Speech: Speech normal.        Behavior: Behavior normal. Behavior is cooperative.        Thought Content: Thought content normal.    Results for orders placed or performed during the hospital encounter of 01/28/22  CBC  Result Value Ref Range   WBC 5.8 4.0 - 10.5 K/uL   RBC 4.40 3.87 - 5.11 MIL/uL   Hemoglobin 11.2 (L) 12.0 - 15.0 g/dL   HCT 36.6 36.0 - 46.0 %   MCV 83.2 80.0 - 100.0 fL   MCH 25.5 (L) 26.0 - 34.0 pg   MCHC 30.6 30.0 - 36.0 g/dL   RDW 14.9 11.5 - 15.5 %   Platelets 382 150 - 400 K/uL   nRBC 0.0 0.0 - 0.2 %  Comprehensive metabolic panel  Result Value Ref Range   Sodium 133 (L) 135 - 145 mmol/L   Potassium 4.1 3.5 - 5.1 mmol/L   Chloride 100 98 - 111 mmol/L   CO2 23 22 - 32 mmol/L    Glucose, Bld 201 (H) 70 - 99 mg/dL   BUN 13 8 - 23 mg/dL   Creatinine, Ser 0.68 0.44 -  1.00 mg/dL   Calcium 9.1 8.9 - 10.3 mg/dL   Total Protein 7.5 6.5 - 8.1 g/dL   Albumin 3.9 3.5 - 5.0 g/dL   AST 19 15 - 41 U/L   ALT 26 0 - 44 U/L   Alkaline Phosphatase 107 38 - 126 U/L   Total Bilirubin 0.6 0.3 - 1.2 mg/dL   GFR, Estimated >60 >60 mL/min   Anion gap 10 5 - 15      Assessment & Plan:   Problem List Items Addressed This Visit       Endocrine   Poorly controlled type 2 diabetes mellitus ([place]) - Primary    Chronic, ongoing with April A1c 9%.  She wishes not to have labs today, is scheduled with PCP in October.  Have highly recommended she obtain pill box to assist her in remembering night time doses of Metformin and Glipizide, as she is forgetting thi 90% of the time.  At this time continue current medication regimen and adjust as needed.  Check sugars daily.  Focus on diabetic diet.  Return in October as scheduled.        Other   Restless legs syndrome (RLS)    Chronic, ongoing.  Taking Mirapex, but this is not offering benefit and is taking double the amount ordered and over the beneficial dose for RLS.  Will change to Requip 1 MG BID and adjust as needed, can go to 4 MG total daily if needed.  Recommend Magnesium 400 MG QHS as well for rest and RLS.  Return as scheduled in October with PCP.      Other Visit Diagnoses     Fall, initial encounter       Over one week ago, no injuries.  Accidental as room was dark and she tripped on lamp.  Monitor closely.        Follow up plan: Return for as scheduled in October.

## 2022-02-12 NOTE — Assessment & Plan Note (Signed)
Chronic, ongoing.  Taking Mirapex, but this is not offering benefit and is taking double the amount ordered and over the beneficial dose for RLS.  Will change to Requip 1 MG BID and adjust as needed, can go to 4 MG total daily if needed.  Recommend Magnesium 400 MG QHS as well for rest and RLS.  Return as scheduled in October with PCP.

## 2022-02-13 ENCOUNTER — Ambulatory Visit: Payer: BC Managed Care – PPO | Admitting: Gastroenterology

## 2022-02-13 ENCOUNTER — Encounter: Payer: Self-pay | Admitting: Gastroenterology

## 2022-02-13 VITALS — BP 130/71 | HR 91 | Temp 97.5°F | Resp 5 | Ht 60.0 in | Wt 140.0 lb

## 2022-02-13 DIAGNOSIS — D509 Iron deficiency anemia, unspecified: Secondary | ICD-10-CM

## 2022-02-13 MED ORDER — SODIUM CHLORIDE 0.9 % IV SOLN: 500.0000 mL | Freq: Once | INTRAVENOUS | Status: DC

## 2022-02-13 NOTE — Progress Notes (Signed)
34 Pt states she fell Friday and had a decrease in LOC. States she was also confused at times and entire event happen over a 15 minute period.  Pt has history of TIA, MD aware and plan to do EGD after she see neurology. Vss    0955 Robinul 0.1 mg IV given due large amount of secretions upon assessment.  MD made aware, vss

## 2022-02-13 NOTE — Progress Notes (Signed)
Patient procedure cancelled until follow up with neurology per Dr. Havery Moros. IV removed.

## 2022-02-13 NOTE — Progress Notes (Signed)
Spoke with patient pre-procedure. Recently states she had a fall at home, when she tripped in the dark and a lamp hit her. She states she was "disoriented" and is not sure if she lost consciousness at the time. She states she got up from the fall, took her medication, and got back into bed. When asked how much time lapsed she thinks 15 minutes or so, she is really not sure. She denied any significant head trauma, but her description of this even seems a bit atypical for a mechanical fall alone. She does not have good recollection of the event, quite possible she lost consciousness, and in light of her history of TIA, recommend we postpone her EGD and have her follow up with her Neurologist for reassessment and clearance prior to scheduling this EGD, in case she may have had another brief TIA / etc. Spoke with the patient and husband about this, they agree with the plan, will touch base with her neurologist ASAP and contact us when she has been evaluated. She was seen by her PCP about this yesterday. She otherwise feels at baseline today.

## 2022-02-14 ENCOUNTER — Ambulatory Visit (INDEPENDENT_AMBULATORY_CARE_PROVIDER_SITE_OTHER): Payer: BC Managed Care – PPO

## 2022-02-14 VITALS — BP 122/80 | HR 94 | Temp 98.3°F | Resp 16 | Ht 60.0 in | Wt 139.2 lb

## 2022-02-14 DIAGNOSIS — K5100 Ulcerative (chronic) pancolitis without complications: Secondary | ICD-10-CM | POA: Diagnosis not present

## 2022-02-14 MED ORDER — VEDOLIZUMAB 300 MG IV SOLR
300.0000 mg | Freq: Once | INTRAVENOUS | Status: AC
  Administered 2022-02-14: 300 mg via INTRAVENOUS
  Filled 2022-02-14: qty 5

## 2022-02-14 NOTE — Progress Notes (Signed)
Diagnosis: Crohn's Disease  Provider:  Marshell Garfinkel, MD  Procedure: Infusion  IV Type: Peripheral, IV Location: L Antecubital  Entyvio (Vedolizumab), Dose: 300 mg  Infusion Start Time: 0905  Infusion Stop Time: 9794  Post Infusion IV Care: Peripheral IV Discontinued  Discharge: Condition: Good, Destination: Home . AVS provided to patient.   Performed by:  Adelina Mings, LPN

## 2022-02-19 ENCOUNTER — Ambulatory Visit
Admission: RE | Admit: 2022-02-19 | Discharge: 2022-02-19 | Disposition: A | Discharge status: Home / Self Care | Payer: BC Managed Care – PPO | Source: Ambulatory Visit | Attending: Ophthalmology | Admitting: Ophthalmology

## 2022-02-19 DIAGNOSIS — R519 Headache, unspecified: Secondary | ICD-10-CM | POA: Diagnosis not present

## 2022-02-19 DIAGNOSIS — H04202 Unspecified epiphora, left lacrimal gland: Secondary | ICD-10-CM | POA: Diagnosis not present

## 2022-02-19 DIAGNOSIS — R296 Repeated falls: Secondary | ICD-10-CM | POA: Diagnosis not present

## 2022-02-19 DIAGNOSIS — R531 Weakness: Secondary | ICD-10-CM | POA: Diagnosis not present

## 2022-02-19 DIAGNOSIS — G629 Polyneuropathy, unspecified: Secondary | ICD-10-CM | POA: Diagnosis not present

## 2022-02-19 DIAGNOSIS — J3489 Other specified disorders of nose and nasal sinuses: Secondary | ICD-10-CM | POA: Diagnosis not present

## 2022-02-19 MED ORDER — GADOBUTROL 1 MMOL/ML IV SOLN
6.0000 mL | Freq: Once | INTRAVENOUS | Status: AC | PRN
  Administered 2022-02-19: 6 mL via INTRAVENOUS

## 2022-02-23 ENCOUNTER — Other Ambulatory Visit: Payer: Self-pay | Admitting: Physician Assistant

## 2022-02-23 DIAGNOSIS — G459 Transient cerebral ischemic attack, unspecified: Secondary | ICD-10-CM

## 2022-02-23 DIAGNOSIS — R410 Disorientation, unspecified: Secondary | ICD-10-CM

## 2022-03-01 ENCOUNTER — Ambulatory Visit: Payer: BC Managed Care – PPO | Admitting: Physician Assistant

## 2022-03-01 ENCOUNTER — Encounter: Payer: Self-pay | Admitting: Physician Assistant

## 2022-03-01 DIAGNOSIS — Z79899 Other long term (current) drug therapy: Secondary | ICD-10-CM | POA: Diagnosis not present

## 2022-03-01 DIAGNOSIS — G4700 Insomnia, unspecified: Secondary | ICD-10-CM

## 2022-03-01 DIAGNOSIS — G43009 Migraine without aura, not intractable, without status migrainosus: Secondary | ICD-10-CM

## 2022-03-01 DIAGNOSIS — G2581 Restless legs syndrome: Secondary | ICD-10-CM

## 2022-03-01 DIAGNOSIS — F319 Bipolar disorder, unspecified: Secondary | ICD-10-CM

## 2022-03-01 DIAGNOSIS — F411 Generalized anxiety disorder: Secondary | ICD-10-CM

## 2022-03-01 DIAGNOSIS — E559 Vitamin D deficiency, unspecified: Secondary | ICD-10-CM | POA: Diagnosis not present

## 2022-03-01 MED ORDER — LAMOTRIGINE 200 MG PO TABS
400.0000 mg | ORAL_TABLET | Freq: Every day | ORAL | 11 refills | Status: DC
Start: 2022-03-01 — End: 2022-10-18

## 2022-03-01 NOTE — Progress Notes (Signed)
[facility] Check  Patient ID: Amy Hansen,  MRN: [id number]  PCP: [name], NP  Date of Evaluation: 03/01/2022   Time spent:40 minutes  Chief Complaint:  Chief Complaint   Depression; Anxiety; Follow-up    HISTORY/CURRENT STATUS: For routine med check.  Her nephew is with her.  As far as her mood goes, states she is doing pretty well. Patient is able to enjoy things.  Energy and motivation are good.  Work is going well.   No extreme sadness, tearfulness, or feelings of hopelessness.  Sleeps well most of the time. ADLs and personal hygiene are normal.   Denies any changes in concentration, making decisions, or remembering things.  Appetite has not changed.  Weight is stable.  Denies suicidal or homicidal thoughts.  Anxiety is still a problem and the Xanax is not always effective.  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  Then she has to go without for a few days before it is time to fill it again.  She would really like to not be so dependent on it but she has been on it so long she does not know how to get off, especially since she still has a lot of anxiety.  If she does not take it she feels panicky, and will start to hyperventilate on rare occasions and will have palpitations.  Most of the time though it is not panic attacks that are the problem, it is a generalized sense of extreme unease, like something bad is going to happen at any time.  The Xanax does help but just not enough.  She took gabapentin a while back for sequela of a TIA but is off of it now.  She took a low dose and does not remember how she felt as far as anxiety went at that time.  Migraines are pretty frequent, has 1 to 2/week. Saw her neuro PA, Luella Cook, a few weeks ago, who wanted to consider changing Zoloft to Effexor for migraines and told M[place]artney to mention it to me to get my opinion.  The migraines can be severe but are usually not debilitating to the  point that she misses work.  She did have a really severe 1 a month ago that lasted 3 days, had to go to the ER with that one and was diagnosed with Dacryocystocele, not sure if that brought on a migraine or not.  She does get nauseated with the migraines.  She does have a history of TIAs.  Currently no reports of focal neurologic or motor deficits.  Has fallen several times in the past 3-4 months.  States she is "clumsy."  [place] in her bedroom during the night when she got up to go to the bathroom, no serious injury.  States her husband keeps their bedroom completely blacked out so she tripped while trying to turn on a lamp.  Another time she fell in the room getting ready for an endoscopy and the endoscopy was postponed due to the fall.  She did not hurt herself seriously.  Cut her right thumb and had to have stitches, after accidentally slamming it into the car door.  Patient denies increased energy with decreased need for sleep, increased talkativeness, racing thoughts, impulsivity or risky behaviors, increased spending, increased libido, grandiosity, increased irritability or anger, paranoia, and no hallucinations.  Review of Systems  Constitutional: Negative.   HENT: Negative.    Eyes:  See HPI  Respiratory: Negative.    Cardiovascular: Negative.   Gastrointestinal: Negative.   Genitourinary: Negative.   Musculoskeletal: Negative.   Skin: Negative.   Neurological:        See HPI  Endo/Heme/Allergies: Negative.   Psychiatric/Behavioral:         See HPI    Individual Medical History/ Review of Systems: Changes? :Yes    had to go to the ER 01/28/2022, cyst of left tear duct. Had severe h/a for 3 days.  Has had MRI and has seen a specialist, will have to have surgery, sounds like soon but she does not know yet.  Past medications for mental health diagnoses include: Trazodone, Risperdal, Zoloft, Lunesta, prazosin, Sonata, Prozac, Depakote, Lamictal, lithium, Wellbutrin, Xanax, Ambien,  carbamazepine, Seroquel   Allergies: Avelox [moxifloxacin hcl in nacl], Bactrim [sulfamethoxazole-trimethoprim], Ciprofloxacin, Depakote [divalproex sodium], Imitrex [sumatriptan], and Stadol [butorphanol]  Current Medications:  Current Outpatient Medications:    ALPRAZolam (XANAX) 1 MG tablet, Take 1 tablet (1 mg total) by mouth 3 (three) times daily as needed. for anxiety, Disp: 90 tablet, Rfl: 5   buPROPion (WELLBUTRIN XL) 150 MG 24 hr tablet, Take 3 tablets (450 mg total) by mouth daily., Disp: 270 tablet, Rfl: 3   furosemide (LASIX) 20 MG tablet, Take 1 tablet (20 mg total) by mouth daily. May take an additional tablet (58m) AS NEEDED for weight gain and/or swelling., Disp: 90 tablet, Rfl: 3   glipiZIDE (GLUCOTROL) 5 MG tablet, Take 1 tablet by mouth twice daily., Disp: 180 tablet, Rfl: 1   metFORMIN (GLUCOPHAGE) 1000 MG tablet, Take 1 tablet by mouth twice daily., Disp: 180 tablet, Rfl: 1   ondansetron (ZOFRAN-ODT) 4 MG disintegrating tablet, Take 1 tablet (4 mg total) by mouth every 8 (eight) hours as needed for nausea or vomiting. Please keep your upcoming appointment in March for further refills., Disp: 30 tablet, Rfl: 0   QUEtiapine (SEROQUEL) 300 MG tablet, Take 1 tablet (300 mg total) by mouth at bedtime., Disp: 90 tablet, Rfl: 1   rOPINIRole (REQUIP) 1 MG tablet, Take 1 tablet (1 mg total) by mouth 2 (two) times daily., Disp: 180 tablet, Rfl: 1   sertraline (ZOLOFT) 100 MG tablet, Take 2 tablets (200 mg total) by mouth daily., Disp: 60 tablet, Rfl: 11   TRULICITY 05.17MOH/6.0VPSOPN, INJECT 0.75 MG SUBCUTANEOUSLY ONCE A WEEK, Disp: 6 mL, Rfl: 0   vedolizumab (ENTYVIO) 300 MG injection, Inject into the vein every 8 (eight) weeks., Disp: , Rfl:    Vitamin D, Ergocalciferol, (DRISDOL) 1.25 MG (50000 UNIT) CAPS capsule, TAKE 1 CAPSULE BY MOUTH EVERY 7 DAYS, Disp: 6 capsule, Rfl: 0   Cholecalciferol (VITAMIN D) 50 MCG (2000 UT) tablet, Take 2 tablets (4,000 Units total) by mouth daily.  (Patient not taking: Reported on 03/01/2022), Disp: , Rfl:    dicyclomine (BENTYL) 10 MG capsule, Take 1 capsule (10 mg total) by mouth 3 (three) times daily as needed for spasms. (Patient not taking: Reported on 11/30/2021), Disp: 90 capsule, Rfl: 4   HYDROcodone-acetaminophen (NORCO/VICODIN) 5-325 MG tablet, Take 1 tablet by mouth every 4 (four) hours as needed for moderate pain. (Patient not taking: Reported on 02/12/2022), Disp: 12 tablet, Rfl: 0   lamoTRIgine (LAMICTAL) 200 MG tablet, Take 2 tablets (400 mg total) by mouth at bedtime., Disp: 60 tablet, Rfl: 11   loperamide (IMODIUM A-D) 2 MG tablet, Take 1 tablet (2 mg total) by mouth as needed for diarrhea or loose stools. (Patient not taking: Reported on  11/30/2021), Disp: 30 tablet, Rfl: 0   mupirocin ointment (BACTROBAN) 2 %, mupirocin 2 % topical ointment  APPLY TOPICALLY ONCE DAILY (Patient not taking: Reported on 02/12/2022), Disp: , Rfl:  Medication Side Effects: none  Family Medical/ Social History: Changes?  No  MENTAL HEALTH EXAM:  There were no vitals taken for this visit.There is no height or weight on file to calculate BMI.  General Appearance: Casual, Neat, and Well Groomed  Eye Contact:  Good  Speech:  Clear and Coherent and Normal Rate  Volume:  Normal  Mood:  Anxious  Affect:  Congruent  Thought Process:  Goal Directed and Descriptions of Associations: Circumstantial  Orientation:  Full (Time, Place, and Person)  Thought Content: Logical   Suicidal Thoughts:  No  Homicidal Thoughts:  No  Memory:  WNL  Judgement:  Good  Insight:  Good  Psychomotor Activity:  Normal  Concentration:  Concentration: Good and Attention Span: Good  Recall:  Good  Fund of Knowledge: Good  Language: Good  Assets:  Desire for Improvement Financial Resources/Insurance Housing Social Support Transportation  ADL's:  Intact  Cognition: WNL  Prognosis:  Good   Labs 01/18/2022 CBC hemoglobin 10.1, hematocrit 31, platelets 382 01/28/2022 CMP  sodium 133, glucose 201, otherwise normal   DIAGNOSES:    ICD-10-CM   1. Generalized anxiety disorder  F41.1     2. Hypovitaminosis D  E55.9 VITAMIN D 25 Hydroxy (Vit-D Deficiency, Fractures)    3. Encounter for long-term (current) use of medications  Z79.899 VITAMIN D 25 Hydroxy (Vit-D Deficiency, Fractures)    4. Bipolar I disorder ([place])  F31.9     5. Migraine without aura and without status migrainosus, not intractable  G43.009     6. Restless legs syndrome (RLS)  G25.81     7. Insomnia, unspecified type  G47.00      Receiving Psychotherapy: No   RECOMMENDATIONS:  PDMP reviewed.  Last Xanax was filled 02/23/2022.  Also prescribed hydrocodone and tramadol since our last visit. I provided 40 minutes of face to face time during this encounter, including time spent before and after the visit in records review, medical decision making, counseling pertinent to today's visit, and charting.   Disc the change from Zoloft to Effexor. I'll talk w/ Luella Cook, PA-C about effexor. (Pr to sign ROI) Probably not good idea at the moment, b/c of the anxiety and Effexor doesn't help anx as much as an SSRI.  Consider re-starting Gabapentin possibly at a higher dose to treat the anxiety, as we're weaning off Zoloft and going to Effexor, but  I'm hesitant to make big changes right now. She'll likely be having surgery for the dacrocystocele and I don't want to muddy the water with med changes that aren't 100% necessary at this time. She understands and agrees.   Disc BZ addiction/overuse. Even when taking as directed, there's risk of confusion, dizziness, falls and since she's overtaking, it makes those risks even greater. I will not make a change in the tid dosing for now, but have instructed her to take no more than prescribed. And once a definite plan is in place about the surgery and/or afterwards, will start to wean Xanax by taking 0.25 mg less daily for 2 weeks at a time until she's off. That can take  months, but if she has more falls or other problems, will wean off more rapidly.   Continue Xanax 1 mg, 1 po tid prn. Or ok to take 1/2 pill, 6 times  per day. Not over 3 pills per day. Continue Wellbutrin XL 150 mg, 3 p.o. daily. Continue Lamictal 200 mg, 1 p.o. twice daily. Continue Requip 1 mg bid (PCP) Continue Zoloft 100 mg, 2 p.o. daily. Continue Vit D 50,000 iu weekly. Lab ordered as above. Return in 4-6 weeks.   Donnal Moat, PA-C

## 2022-03-02 ENCOUNTER — Ambulatory Visit
Admission: RE | Admit: 2022-03-02 | Discharge: 2022-03-02 | Disposition: A | Discharge status: Home / Self Care | Payer: BC Managed Care – PPO | Source: Ambulatory Visit | Attending: Physician Assistant | Admitting: Physician Assistant

## 2022-03-02 DIAGNOSIS — R410 Disorientation, unspecified: Secondary | ICD-10-CM | POA: Diagnosis not present

## 2022-03-02 DIAGNOSIS — G459 Transient cerebral ischemic attack, unspecified: Secondary | ICD-10-CM

## 2022-03-06 ENCOUNTER — Ambulatory Visit: Payer: Self-pay

## 2022-03-06 ENCOUNTER — Telehealth: Payer: BC Managed Care – PPO

## 2022-03-06 NOTE — Patient Instructions (Signed)
Visit Information  Thank you for taking time to visit with me today. Please don't hesitate to contact me if I can be of assistance to you.   Following are the goals we discussed today:   Goals Addressed             This Visit's Progress    COMPLETED: RNCM: Effective Management of DM       Care Coordination Interventions: Lab Results  Component Value Date   HGBA1C 9.0 (H) 10/30/2021    Provided education to patient about basic DM disease process Reviewed medications with patient and discussed importance of medication adherence. 03-06-2022: The patient was forgetting to take her night time medications for her DM, but the patient has started now and feels her A1C is better Counseled on importance of regular laboratory monitoring as prescribed. Education and support given Discussed plans with patient for ongoing care management follow up and provided patient with direct contact information for care management team Provided patient with written educational materials related to hypo and hyperglycemia and importance of correct treatment Reviewed scheduled/upcoming provider appointments including: 05-01-2022 with pcp, several upcoming appointments with specialist  Advised patient, providing education and rationale, to check cbg as directed  and record, calling pcp for findings outside established parameters. 03-06-2022: The patient admits she does not check her blood sugars regularly. The patient encouraged to check regularly.  Review of patient status, including review of consultants reports, relevant laboratory and other test results, and medications completed Screening for signs and symptoms of depression related to chronic disease state  Assessed social determinant of health barriers        COMPLETED: RNCM; Effective management of pain       Care Coordination Interventions: Reviewed provider established plan for pain management Discussed importance of adherence to all scheduled medical  appointments Counseled on the importance of reporting any/all new or changed pain symptoms or management strategies to pain management provider Advised patient to report to care team affect of pain on daily activities Discussed use of relaxation techniques and/or diversional activities to assist with pain reduction (distraction, imagery, relaxation, massage, acupressure, TENS, heat, and cold application Reviewed with patient prescribed pharmacological and nonpharmacological pain relief strategies Advised patient to discuss unresolved pain, changes in level or intensity of pain with provider Screening for signs and symptoms of depression related to chronic disease state  Assessed social determinant of health barriers  The patient knows the [facility] is not going to do regular outreaches. Has RNCM number to call if needed in the future. Has several upcoming appointments. Knows to call for changes or new needs.              Please call the care guide team at [phone] if you need to cancel or reschedule your appointment.   If you are experiencing a Mental Health or [place] or need someone to talk to, please call the Suicide and Crisis Lifeline: 988 call the [place] National Suicide Prevention Lifeline: 702-561-3107 or TTY: (628) 334-6854 TTY 631 669 7911) to talk to a trained counselor call 1-800-273-TALK (toll free, 24 hour hotline)  Patient verbalizes understanding of instructions and care plan provided today and agrees to view in [place]. Active MyChart status and patient understanding of how to access instructions and care plan via MyChart confirmed with patient.     No further follow up required: the patient knows to call the [facility] for new changes or concerns  Noreene Larsson RN, MSN, [facility] Mobile: [phone]

## 2022-03-06 NOTE — Patient Outreach (Signed)
Care Coordination   Follow Up Visit Note   03/06/2022 Name: JAILEIGH WEIMER MRN: [id number] DOB: Oct 22, 1955  PIEDAD STANDIFORD is a 66 y.o. year old female who sees [name], NP for primary care. I spoke with  [name] by phone today  What matters to the patients health and wellness today?  My DM and pain management    Goals Addressed             This Visit's Progress    COMPLETED: RNCM: Effective Management of DM       Care Coordination Interventions: Lab Results  Component Value Date   HGBA1C 9.0 (H) 10/30/2021    Provided education to patient about basic DM disease process Reviewed medications with patient and discussed importance of medication adherence. 03-06-2022: The patient was forgetting to take her night time medications for her DM, but the patient has started now and feels her A1C is better Counseled on importance of regular laboratory monitoring as prescribed. Education and support given Discussed plans with patient for ongoing care management follow up and provided patient with direct contact information for care management team Provided patient with written educational materials related to hypo and hyperglycemia and importance of correct treatment Reviewed scheduled/upcoming provider appointments including: 05-01-2022 with pcp, several upcoming appointments with specialist  Advised patient, providing education and rationale, to check cbg as directed  and record, calling pcp for findings outside established parameters. 03-06-2022: The patient admits she does not check her blood sugars regularly. The patient encouraged to check regularly.  Review of patient status, including review of consultants reports, relevant laboratory and other test results, and medications completed Screening for signs and symptoms of depression related to chronic disease state  Assessed social determinant of health barriers        COMPLETED: RNCM; Effective management of pain       Care  Coordination Interventions: Reviewed provider established plan for pain management Discussed importance of adherence to all scheduled medical appointments Counseled on the importance of reporting any/all new or changed pain symptoms or management strategies to pain management provider Advised patient to report to care team affect of pain on daily activities Discussed use of relaxation techniques and/or diversional activities to assist with pain reduction (distraction, imagery, relaxation, massage, acupressure, TENS, heat, and cold application Reviewed with patient prescribed pharmacological and nonpharmacological pain relief strategies Advised patient to discuss unresolved pain, changes in level or intensity of pain with provider Screening for signs and symptoms of depression related to chronic disease state  Assessed social determinant of health barriers  The patient knows the [facility] is not going to do regular outreaches. Has RNCM number to call if needed in the future. Has several upcoming appointments. Knows to call for changes or new needs.            SDOH assessments and interventions completed:  Yes  SDOH Interventions Today    Flowsheet Row Most Recent Value  SDOH Interventions   Food Insecurity Interventions Intervention Not Indicated  Housing Interventions Intervention Not Indicated  Stress Interventions Intervention Not Indicated  Social Connections Interventions Other (Comment)  [good support system]  Transportation Interventions Intervention Not Indicated        Care Coordination Interventions Activated:  Yes  Care Coordination Interventions:  Yes, provided   Follow up plan: No further intervention required.   Encounter Outcome:  Pt. Visit Completed   Noreene Larsson RN, MSN, [facility] Mobile: [phone]

## 2022-03-08 DIAGNOSIS — H04222 Epiphora due to insufficient drainage, left lacrimal gland: Secondary | ICD-10-CM | POA: Diagnosis not present

## 2022-03-09 ENCOUNTER — Other Ambulatory Visit: Payer: Self-pay | Admitting: Pharmacy Technician

## 2022-03-14 ENCOUNTER — Ambulatory Visit (INDEPENDENT_AMBULATORY_CARE_PROVIDER_SITE_OTHER): Payer: BC Managed Care – PPO | Admitting: *Deleted

## 2022-03-14 VITALS — BP 121/78 | HR 92 | Temp 98.2°F | Resp 16 | Ht 64.0 in | Wt 148.0 lb

## 2022-03-14 DIAGNOSIS — K5100 Ulcerative (chronic) pancolitis without complications: Secondary | ICD-10-CM | POA: Diagnosis not present

## 2022-03-14 MED ORDER — ONDANSETRON HCL 4 MG/2ML IJ SOLN
4.0000 mg | Freq: Once | INTRAMUSCULAR | Status: AC
  Administered 2022-03-14: 4 mg via INTRAVENOUS
  Filled 2022-03-14: qty 2

## 2022-03-14 MED ORDER — VEDOLIZUMAB 300 MG IV SOLR
300.0000 mg | Freq: Once | INTRAVENOUS | Status: AC
  Administered 2022-03-14: 300 mg via INTRAVENOUS
  Filled 2022-03-14: qty 5

## 2022-03-14 NOTE — Progress Notes (Signed)
Diagnosis: Crohn's Disease  Provider:  Marshell Garfinkel MD  Procedure: Infusion  IV Type: Peripheral, IV Location: L Antecubital  Entyvio (Vedolizumab), Dose: 300 mg  Patient became nauseated, vomited small amount undigested food, physician called, IV Zofran 52m given @ 9:10 am  Infusion Start Time: 0928 am  Infusion Stop Time: 1000 am  Post Infusion IV Care: Observation period completed and Peripheral IV Discontinued Patient doing much better after Zofran  Discharge: Condition: Good, Destination: Home . AVS provided to patient.   Performed by:  WOren Beckmann RN

## 2022-03-15 ENCOUNTER — Telehealth: Payer: Self-pay

## 2022-03-15 NOTE — Telephone Encounter (Signed)
Letter for [employer] composed and placed in Dr. Doyne Keel office for signature.

## 2022-03-20 ENCOUNTER — Other Ambulatory Visit: Payer: Self-pay | Admitting: Physician Assistant

## 2022-03-20 NOTE — Telephone Encounter (Signed)
Ok to send

## 2022-03-20 NOTE — Telephone Encounter (Signed)
Ok to send in 2 pills only. Remind her to get the Vit D level checked, I need that before I know she needs this dose. Thanks.

## 2022-03-23 ENCOUNTER — Telehealth: Payer: Self-pay

## 2022-03-23 NOTE — Telephone Encounter (Signed)
-----   Message from Yevette Edwards, RN sent at 01/26/2022 11:49 AM EDT ----- Regarding: Labs CBC, IBC + Ferritin - orders are in epic

## 2022-03-23 NOTE — Telephone Encounter (Signed)
MyChart message sent to patient with lab reminder.

## 2022-03-26 NOTE — Telephone Encounter (Signed)
Spoke with patient to remind her that she is due for repeat labs at this time. No appointment is necessary. Patient is aware that she can stop by the lab in the basement at her convenience between 7:30 AM - 5 PM, Monday through Friday. Patient stated that she was also ready to reschedule her EGD. She states that she saw her neurologist and has been cleared to proceed with EGD. Dr. Havery Moros, I told pt that I would have you review her records before we reschedule. Thanks

## 2022-03-26 NOTE — Telephone Encounter (Signed)
Thanks [employer].  I can see that she was seen by her neurologist for the fall/questionable loss of consciousness that canceled her prior endoscopy.  Looks like she had a CT scan and EEG.  I have a hard time seeing in her epic chart a recap of recommendations based on her CT and EEG, if neurology can put something in the chart that she is cleared for endoscopy then we can proceed with scheduling her for her EGD.  Thanks very much.

## 2022-03-28 NOTE — Telephone Encounter (Addendum)
Medical clearance letter printed and faxed to Amy Hansen, [place] at [facility]: [phone], F: [phone])

## 2022-03-29 ENCOUNTER — Telehealth: Payer: Self-pay | Admitting: Pharmacy Technician

## 2022-03-29 ENCOUNTER — Telehealth: Payer: Self-pay | Admitting: Gastroenterology

## 2022-03-29 NOTE — Telephone Encounter (Signed)
Inbound call from patient stating she is ready to schedule for her edg? Pleaser give patient a call back to further advise.  Thank you

## 2022-03-29 NOTE — Telephone Encounter (Signed)
Please see alternate TE. We are awaiting medical clearance from neurology to reschedule EGD. I called pt and informed her of this information. Pt verbalized understanding and had no concerns at the end of the call.

## 2022-03-29 NOTE — Telephone Encounter (Addendum)
Urgent request PA RENEWAL  Auth Submission:  APPROVED Payer: [employer] Medication & CPT/J Code(s) submitted: Entyvio (Vedolizumab) [id number] Route of submission (phone, fax, portal): COVER MY MEDS Phone # Fax # Auth type: Buy/Bill Units/visits requested: 300MG Q4WKS Reference number: [id number] Approval from:  03/29/22 to  03/28/23 at [facility]

## 2022-04-03 ENCOUNTER — Encounter: Payer: Self-pay | Admitting: Oncology

## 2022-04-03 ENCOUNTER — Encounter: Payer: Self-pay | Admitting: Gastroenterology

## 2022-04-03 NOTE — Telephone Encounter (Signed)
Attempted to reach Neurology office at [facility], they are currently closed. Will attempt to reach office again during business hours.

## 2022-04-04 NOTE — Telephone Encounter (Signed)
Medical clearance request was returned - "Would recommend patient complete EEG first to evaluate for seizure like activity. Otherwise, clear from a neurology perspective. Hold ASA for shortest duration possible". I tried to call the neurology office to see when EEG appt is. I held for 15  minutes and was not able to speak with anyone.   Attempted to reach patient. Her phone went straight to vm. I left her a detailed vm with the information above. I asked that patient give me a call back about her EEG appt or she can send the information through [place]. I advised pt that once EEG has been completed and reviewed then we can discuss rescheduling EGD. I advised pt to call back with any questions or concerns.

## 2022-04-04 NOTE — Telephone Encounter (Signed)
Called Neurology office at [facility] clinic to follow up on medical clearance request. I spoke with [facility], she informed me that the nurses have the request and she will send them a note to send it back. I asked that it be faxed to my attention at [phone]. Will await fax.

## 2022-04-05 ENCOUNTER — Encounter: Payer: Self-pay | Admitting: Physician Assistant

## 2022-04-05 ENCOUNTER — Ambulatory Visit: Payer: BC Managed Care – PPO | Admitting: Physician Assistant

## 2022-04-05 DIAGNOSIS — F411 Generalized anxiety disorder: Secondary | ICD-10-CM

## 2022-04-05 DIAGNOSIS — G4700 Insomnia, unspecified: Secondary | ICD-10-CM

## 2022-04-05 DIAGNOSIS — F319 Bipolar disorder, unspecified: Secondary | ICD-10-CM

## 2022-04-05 DIAGNOSIS — G2581 Restless legs syndrome: Secondary | ICD-10-CM | POA: Diagnosis not present

## 2022-04-05 DIAGNOSIS — E559 Vitamin D deficiency, unspecified: Secondary | ICD-10-CM

## 2022-04-05 NOTE — Progress Notes (Signed)
[facility]  Patient ID: Amy Hansen,  MRN: [id number]  PCP: [name], NP  Date of Evaluation: 04/05/2022   Time spent:20 minutes  Chief Complaint:  Chief Complaint   Anxiety; Depression; Follow-up    HISTORY/CURRENT STATUS: For routine med check.  Her nephew is with her.  Sleeping better than she had been. Feels good as far as her mood goes. Patient is able to enjoy things.  Energy and motivation are good.  Work is going well.   No extreme sadness, tearfulness, or feelings of hopelessness.  ADLs and personal hygiene are normal.   Denies any changes in concentration, making decisions, or remembering things.  Appetite has not changed.  Weight is stable.  Denies suicidal or homicidal thoughts.  Anxiety is still a problem.  The Xanax is effective.  She will start to feel panicky several times a day and will take the Xanax which relieves the symptoms.  She has not had a full-blown panic attack recently.  Patient denies increased energy with decreased need for sleep, increased talkativeness, racing thoughts, impulsivity or risky behaviors, increased spending, increased libido, grandiosity, increased irritability or anger, paranoia, or hallucinations.  Denies dizziness, syncope, seizures, numbness, tingling, tremor, tics, unsteady gait, slurred speech, confusion. Denies muscle or joint pain, stiffness, or dystonia.Denies unexplained weight loss, frequent infections, or sores that heal slowly.  No polyphagia, polydipsia, or polyuria. Denies visual changes or paresthesias.   Individual Medical History/ Review of Systems: Changes? :Yes    will be having surgery on her eye either this month or next month.  See records on chart.  Past medications for mental health diagnoses include: Trazodone, Risperdal, Zoloft, Lunesta, prazosin, Sonata, Prozac, Depakote, Lamictal, lithium, Wellbutrin, Xanax, Ambien, carbamazepine, Seroquel   Allergies: Avelox [moxifloxacin hcl in nacl],  Bactrim [sulfamethoxazole-trimethoprim], Ciprofloxacin, Depakote [divalproex sodium], Imitrex [sumatriptan], and Stadol [butorphanol]  Current Medications:  Current Outpatient Medications:    ALPRAZolam (XANAX) 1 MG tablet, Take 1 tablet (1 mg total) by mouth 3 (three) times daily as needed. for anxiety, Disp: 90 tablet, Rfl: 5   buPROPion (WELLBUTRIN XL) 150 MG 24 hr tablet, Take 3 tablets (450 mg total) by mouth daily., Disp: 270 tablet, Rfl: 3   furosemide (LASIX) 20 MG tablet, Take 1 tablet (20 mg total) by mouth daily. May take an additional tablet (68m) AS NEEDED for weight gain and/or swelling., Disp: 90 tablet, Rfl: 3   glipiZIDE (GLUCOTROL) 5 MG tablet, Take 1 tablet by mouth twice daily., Disp: 180 tablet, Rfl: 1   lamoTRIgine (LAMICTAL) 200 MG tablet, Take 2 tablets (400 mg total) by mouth at bedtime., Disp: 60 tablet, Rfl: 11   metFORMIN (GLUCOPHAGE) 1000 MG tablet, Take 1 tablet by mouth twice daily., Disp: 180 tablet, Rfl: 1   ondansetron (ZOFRAN-ODT) 4 MG disintegrating tablet, Take 1 tablet (4 mg total) by mouth every 8 (eight) hours as needed for nausea or vomiting. Please keep your upcoming appointment in March for further refills., Disp: 30 tablet, Rfl: 0   QUEtiapine (SEROQUEL) 300 MG tablet, Take 1 tablet (300 mg total) by mouth at bedtime., Disp: 90 tablet, Rfl: 1   rOPINIRole (REQUIP) 1 MG tablet, Take 1 tablet (1 mg total) by mouth 2 (two) times daily., Disp: 180 tablet, Rfl: 1   sertraline (ZOLOFT) 100 MG tablet, Take 2 tablets (200 mg total) by mouth daily., Disp: 60 tablet, Rfl: 11   TRULICITY 08.31MDV/7.6HYSOPN, INJECT 0.75 MG SUBCUTANEOUSLY ONCE A WEEK, Disp: 6 mL, Rfl: 0   vedolizumab (  ENTYVIO) 300 MG injection, Inject into the vein every 8 (eight) weeks., Disp: , Rfl:    Vitamin D, Ergocalciferol, (DRISDOL) 1.25 MG (50000 UNIT) CAPS capsule, Take 1 capsule by mouth once a week, Disp: 2 capsule, Rfl: 0   Cholecalciferol (VITAMIN D) 50 MCG (2000 UT) tablet, Take 2  tablets (4,000 Units total) by mouth daily. (Patient not taking: Reported on 03/01/2022), Disp: , Rfl:    dicyclomine (BENTYL) 10 MG capsule, Take 1 capsule (10 mg total) by mouth 3 (three) times daily as needed for spasms. (Patient not taking: Reported on 11/30/2021), Disp: 90 capsule, Rfl: 4   HYDROcodone-acetaminophen (NORCO/VICODIN) 5-325 MG tablet, Take 1 tablet by mouth every 4 (four) hours as needed for moderate pain. (Patient not taking: Reported on 02/12/2022), Disp: 12 tablet, Rfl: 0   loperamide (IMODIUM A-D) 2 MG tablet, Take 1 tablet (2 mg total) by mouth as needed for diarrhea or loose stools. (Patient not taking: Reported on 11/30/2021), Disp: 30 tablet, Rfl: 0   mupirocin ointment (BACTROBAN) 2 %, mupirocin 2 % topical ointment  APPLY TOPICALLY ONCE DAILY (Patient not taking: Reported on 02/12/2022), Disp: , Rfl:  Medication Side Effects: none  Family Medical/ Social History: Changes?  Dtr and her 2 teenage dtrs have moved in with her and her husband.   MENTAL HEALTH EXAM:  There were no vitals taken for this visit.There is no height or weight on file to calculate BMI.  General Appearance: Casual, Neat, and Well Groomed  Eye Contact:  Good  Speech:  Clear and Coherent and Normal Rate  Volume:  Normal  Mood:  Euthymic  Affect:  Congruent  Thought Process:  Goal Directed and Descriptions of Associations: Circumstantial  Orientation:  Full (Time, Place, and Person)  Thought Content: Logical   Suicidal Thoughts:  No  Homicidal Thoughts:  No  Memory:  WNL  Judgement:  Good  Insight:  Good  Psychomotor Activity:  Normal  Concentration:  Concentration: Good and Attention Span: Good  Recall:  Good  Fund of Knowledge: Good  Language: Good  Assets:  Desire for Improvement Financial Resources/Insurance Housing Social Support Transportation  ADL's:  Intact  Cognition: WNL  Prognosis:  Good   DIAGNOSES:    ICD-10-CM   1. Bipolar I disorder ([place])  F31.9     2. Generalized  anxiety disorder  F41.1     3. Insomnia, unspecified type  G47.00     4. Restless legs syndrome (RLS)  G25.81     5. Hypovitaminosis D  E55.9      Receiving Psychotherapy: No   RECOMMENDATIONS:  PDMP reviewed.  Last Xanax was filled 03/22/2022.   I provided 20 minutes of face to face time during this encounter, including time spent before and after the visit in records review, medical decision making, counseling pertinent to today's visit, and charting.   At the last visit we discussed a slow decrease of the Xanax, but honestly this is not a good time in her life.  Her daughter and 2 granddaughters have moved in with her and her husband, she will be having eye surgery sometime in the next month or so, and then there is Thanksgiving and Christmas.  She knows she cannot take over 3 mg of Xanax per day and she will not get an early refill.  She understands and agrees that now is not a good time to try to wean.  Continue Xanax 1 mg, 1 po tid prn. Or ok to take 1/2 pill, 6  times per day. Not over 3 pills per day. Continue Wellbutrin XL 150 mg, 3 p.o. daily. Continue Lamictal 200 mg, 1 p.o. twice daily. Continue Requip 1 mg bid (PCP) Continue Zoloft 100 mg, 2 p.o. daily. Continue Vit D 50,000 iu weekly. Return in 4 months.  Donnal Moat, PA-C

## 2022-04-06 DIAGNOSIS — R569 Unspecified convulsions: Secondary | ICD-10-CM | POA: Diagnosis not present

## 2022-04-10 NOTE — Telephone Encounter (Signed)
Patient completed EEG on 04/06/22. Called Neurology at [facility] to follow up and see if patient was cleared to proceed with EGD. I was informed that EEG has not been reviewed by the physician yet and a message would be sent to the nurse.

## 2022-04-11 ENCOUNTER — Ambulatory Visit: Payer: BC Managed Care – PPO

## 2022-04-12 ENCOUNTER — Ambulatory Visit (INDEPENDENT_AMBULATORY_CARE_PROVIDER_SITE_OTHER): Payer: BC Managed Care – PPO

## 2022-04-12 VITALS — BP 111/72 | HR 98 | Temp 98.4°F | Resp 18 | Ht 60.0 in | Wt 148.0 lb

## 2022-04-12 DIAGNOSIS — K5100 Ulcerative (chronic) pancolitis without complications: Secondary | ICD-10-CM | POA: Diagnosis not present

## 2022-04-12 MED ORDER — VEDOLIZUMAB 300 MG IV SOLR
300.0000 mg | Freq: Once | INTRAVENOUS | Status: AC
  Administered 2022-04-12: 300 mg via INTRAVENOUS
  Filled 2022-04-12: qty 5

## 2022-04-12 NOTE — Progress Notes (Signed)
Diagnosis: Crohn's Disease  Provider:  Marshell Garfinkel MD  Procedure: Infusion  IV Type: Peripheral, IV Location: L Antecubital  Entyvio (Vedolizumab), Dose: 300 mg  Infusion Start Time: 9233  Infusion Stop Time: 0076  Post Infusion IV Care: Peripheral IV Discontinued  Discharge: Condition: Good, Destination: Home . AVS provided to patient.   Performed by:  Adelina Mings, LPN

## 2022-04-16 NOTE — Telephone Encounter (Signed)
I have not received a return call from Neurology at [facility]. I called their office again today and was not able to speak with anyone directly. I faxed a new medical clearance request to reschedule EGD.

## 2022-04-17 ENCOUNTER — Other Ambulatory Visit: Payer: Self-pay | Admitting: Nurse Practitioner

## 2022-04-17 NOTE — Telephone Encounter (Signed)
Refilled 02/01/2022 74m . Requested Prescriptions  Pending Prescriptions Disp Refills  . TRULICITY 08.89MVQ/9.4HWSOPN [Pharmacy Med Name: Trulicity 03.88MEK/8.0KLSubcutaneous Solution Pen-injector] 12 mL 0    Sig: INJECT 1 DOSE SUBCUTANEOUSLY ONCE A WEEK     Endocrinology:  Diabetes - GLP-1 Receptor Agonists Failed - 04/17/2022  9:52 AM      Failed - HBA1C is between 0 and 7.9 and within 180 days    Hemoglobin A1C  Date Value Ref Range Status  03/21/2016 7.3%  Final   HB A1C (BAYER DCA - WAIVED)  Date Value Ref Range Status  10/27/2020 8.7 (H) <7.0 % Final    Comment:                                          Diabetic Adult            <7.0                                       Healthy Adult        4.3 - 5.7                                                           (DCCT/NGSP) American Diabetes Association's Summary of Glycemic Recommendations for Adults with Diabetes: Hemoglobin A1c <7.0%. More stringent glycemic goals (A1c <6.0%) may further reduce complications at the cost of increased risk of hypoglycemia.    Hgb A1c MFr Bld  Date Value Ref Range Status  10/30/2021 9.0 (H) 4.8 - 5.6 % Final    Comment:             Prediabetes: 5.7 - 6.4          Diabetes: >6.4          Glycemic control for adults with diabetes: <7.0          Passed - Valid encounter within last 6 months    Recent Outpatient Visits          2 months ago Poorly controlled type 2 diabetes mellitus ([place]ann Faster [place]   2 months ago Memory change   [facility] Vigg, Avanti, MD   5 months ago [facility]pertension, unspecified type   [facility]   8 months ago Type 2 diabetes mellitus without complication, without long-term current use of insulin ([place]   [facility]   10 months ago Poorly controlled type 2 diabetes mellitus ([place]   [place]      Future  Appointments            In 2 weeks [facility] NP [facility] [place]

## 2022-04-20 NOTE — Telephone Encounter (Signed)
Received a fax back from Neurology stating ""If EEG is normal OK to proceed-results not in". Secure staff message sent to Dr. Havery Moros to see if he is ok with proceed with EGD.

## 2022-04-23 DIAGNOSIS — R519 Headache, unspecified: Secondary | ICD-10-CM | POA: Diagnosis not present

## 2022-04-24 ENCOUNTER — Telehealth: Payer: Self-pay | Admitting: Gastroenterology

## 2022-04-24 ENCOUNTER — Other Ambulatory Visit: Payer: Self-pay | Admitting: Pharmacy Technician

## 2022-04-24 NOTE — Telephone Encounter (Signed)
Inbound call from patient stating that she is scheduled to have an EGD on 10/4 and is seeking advice if she can get some medication proscribed in the meantime due to having some "symptoms" . Patient is requesting a call back to discuss. Please advise.

## 2022-04-24 NOTE — Telephone Encounter (Signed)
Called and spoke with patient. She states that she had a telemedicine visit yesterday with neurology and her EEG was WNL. I found her records in [place] and neurology cleared patient to have EGD. Patient has been scheduled for EGD in the [place] on Wednesday, 05/02/22 at 11 am with Dr. Havery Moros. Pt has been advised that she will need to arrive by 10 am with a care partner. Pt is aware that I will send his instructions via MyChart. Pt verbalized understanding and had no concerns at the end of the call.   Ambulatory referral to GI in epic. Secure staff message sent to pre-certification team since procedure is next week.  EGD instructions sent to patient via [place].

## 2022-04-25 MED ORDER — ONDANSETRON 4 MG PO TBDP: 4.0000 mg | ORAL_TABLET | Freq: Three times a day (TID) | ORAL | 0 refills | Status: DC | PRN

## 2022-04-25 NOTE — Telephone Encounter (Signed)
Spoke with pt and she is aware of recommendations but declined the labs and xray. She just wanted the zofran called to her pharmacy.

## 2022-04-25 NOTE — Telephone Encounter (Signed)
Have reviewed the chart. Pt with Crohns on entiyo Having N/V/abdo pain/diarrhea.  Plan: -Check CBC, CMP, CRP. -Stool studies for GI Pathogen (includes C. Diff) and Calprotectin. -X ray KUB 2 view  -Zofran 4 mg ODT Q 8hrs prn for N/V (#20) -I think she would be fine for EGD. -Let us know how she is the next few days.  RG

## 2022-04-25 NOTE — Telephone Encounter (Signed)
[place] pt with h/o crohns. Pt scheduled for EGD 05/02/22. Reports she is having diarrhea along with nausea and vomiting. She reports her stomach feels like its tied up in knots. She is not sure if it is anxiety and nerves or what. Pt calling to see if something can be  prescribed until her procedure next week. Please advise as DOD.

## 2022-04-30 DIAGNOSIS — E785 Hyperlipidemia, unspecified: Secondary | ICD-10-CM | POA: Insufficient documentation

## 2022-04-30 DIAGNOSIS — E781 Pure hyperglyceridemia: Secondary | ICD-10-CM | POA: Insufficient documentation

## 2022-04-30 NOTE — Progress Notes (Signed)
BP 114/79   Pulse 81   Temp (!) 97.5 F (36.4 C) (Oral)   Wt 145 lb (65.8 kg)   LMP  (LMP Unknown)   SpO2 100%   BMI 28.32 kg/m    Subjective:    Patient ID: Amy Hansen, female    DOB: Sep 16, 1955, 66 y.o.   MRN: [id number]  HPI: Amy Hansen is a 66 y.o. female  Chief Complaint  Patient presents with   Hypertension   Hyperlipidemia   Diabetes    6 month follow up    [place] She reports she ran out of Mirapex, she ran out because she has been taking more then ordered due to symptoms during afternoon time, at nap time.  This is why she ran out of medication.  She went to try to get medication recently, her room is blacked out.  She fell trying to get medication, lamp fell on her.  When she tried to get up to put lamp back she fell, no injuries with this or loss of consciousness. Duration: chronic Discomfort description:  cramping Pain: yes Location: lower legs Bilateral: yes Symmetric: yes Severity: 10/10 Onset:  gradual Frequency:  intermittent Symptoms only occur while legs at rest: yes Sudden unintentional leg jerking: no Bed partner bothered by leg movements: no LE numbness: no Decreased sensation: no Weakness: no Insomnia: no Daytime somnolence: no Fatigue: no Alleviating factors: medication Aggravating factors: resting Status: fluctuating Treatments attempted:  Mirapex  DIABETES Last A1c 9% in April.  Her glucometer broke and she hasn't been able to check her sugar.  To be taking Metformin 1000 MG BID, Glipizide 5 MG BID, Trulicity 1.19 MG.   Hypoglycemic episodes: yes Polydipsia/polyuria: no Visual disturbance: no Chest pain: no Paresthesias: no Glucose Monitoring: yes  Accucheck frequency:   Fasting glucose: 80-300  Post prandial:  Evening:  Before meals: Taking Insulin?: no  Long acting insulin:  Short acting insulin: Blood Pressure Monitoring: not checking Retinal Examination: Up to Date -- Dr. Edison Pace ([place]) Foot  Exam: Up to Date Pneumovax: Up to Date Influenza: Up to Date Aspirin: no   ANEMIA Anemia status: controlled Etiology of anemia: Duration of anemia treatment:  Compliance with treatment: excellent compliance Iron supplementation side effects: no Severity of anemia: mild Fatigue: no Decreased exercise tolerance: no  Dyspnea on exertion: no Palpitations: no Bleeding: no Pica: no  MIGRAINES Duration: years Onset: gradual Severity: moderate Frequency:  3-4 x per month Headache status at time of visit: asymptomatic Treatments attempted: Treatments attempted: aleve", excedrine and triptans   Aura: yes Nausea:  yes Vomiting: yes Photophobia:  yes Phonophobia:  yes Effect on social functioning:  yes   Relevant past medical, surgical, family and social history reviewed and updated as indicated. Interim medical history since our last visit reviewed. Allergies and medications reviewed and updated.  Review of Systems  Constitutional:  Negative for activity change, appetite change, diaphoresis, fatigue and fever.  Respiratory:  Negative for cough, chest tightness and shortness of breath.   Cardiovascular:  Negative for chest pain, palpitations and leg swelling.  Gastrointestinal: Negative.   Endocrine: Negative for polydipsia, polyphagia and polyuria.  Neurological:  Positive for headaches.  Psychiatric/Behavioral:  Positive for dysphoric mood. Negative for suicidal ideas. The patient is nervous/anxious.     Per HPI unless specifically indicated above     Objective:    BP 114/79   Pulse 81   Temp (!) 97.5 F (36.4 C) (Oral)   Wt 145  lb (65.8 kg)   LMP  (LMP Unknown)   SpO2 100%   BMI 28.32 kg/m   Wt Readings from Last 3 Encounters:  05/01/22 145 lb (65.8 kg)  04/12/22 148 lb (67.1 kg)  03/14/22 148 lb (67.1 kg)    Physical Exam Vitals and nursing note reviewed.  Constitutional:      General: She is awake. She is not in acute distress.    Appearance: Normal  appearance. She is well-developed, well-groomed and normal weight. She is not ill-appearing, toxic-appearing or diaphoretic.  HENT:     Head: Normocephalic.     Right Ear: Hearing and external ear normal.     Left Ear: Hearing and external ear normal.     Nose: Nose normal.     Mouth/Throat:     Mouth: Mucous membranes are moist.     Pharynx: Oropharynx is clear.  Eyes:     General: Lids are normal.        Right eye: No discharge.        Left eye: No discharge.     Extraocular Movements: Extraocular movements intact.     Conjunctiva/sclera: Conjunctivae normal.     Pupils: Pupils are equal, round, and reactive to light.  Neck:     Thyroid: No thyromegaly.     Vascular: No carotid bruit.  Cardiovascular:     Rate and Rhythm: Normal rate and regular rhythm.     Heart sounds: Normal heart sounds. No murmur heard.    No gallop.  Pulmonary:     Effort: Pulmonary effort is normal. No accessory muscle usage or respiratory distress.     Breath sounds: Normal breath sounds. No wheezing or rales.  Abdominal:     General: Bowel sounds are normal.     Palpations: Abdomen is soft. There is no hepatomegaly or splenomegaly.  Musculoskeletal:     Cervical back: Normal range of motion and neck supple.     Right lower leg: No edema.     Left lower leg: No edema.  Skin:    General: Skin is warm and dry.     Capillary Refill: Capillary refill takes less than 2 seconds.  Neurological:     General: No focal deficit present.     Mental Status: She is alert and oriented to person, place, and time. Mental status is at baseline.     Deep Tendon Reflexes: Reflexes are normal and symmetric.     Reflex Scores:      Brachioradialis reflexes are 2+ on the right side and 2+ on the left side.      Patellar reflexes are 2+ on the right side and 2+ on the left side. Psychiatric:        Attention and Perception: Attention normal.        Mood and Affect: Mood normal.        Speech: Speech normal.         Behavior: Behavior normal. Behavior is cooperative.        Thought Content: Thought content normal.        Judgment: Judgment normal.    Results for orders placed or performed during the hospital encounter of 01/28/22  CBC  Result Value Ref Range   WBC 5.8 4.0 - 10.5 K/uL   RBC 4.40 3.87 - 5.11 MIL/uL   Hemoglobin 11.2 (L) 12.0 - 15.0 g/dL   HCT 36.6 36.0 - 46.0 %   MCV 83.2 80.0 - 100.0 fL   MCH 25.5 (L)  26.0 - 34.0 pg   MCHC 30.6 30.0 - 36.0 g/dL   RDW 14.9 11.5 - 15.5 %   Platelets 382 150 - 400 K/uL   nRBC 0.0 0.0 - 0.2 %  Comprehensive metabolic panel  Result Value Ref Range   Sodium 133 (L) 135 - 145 mmol/L   Potassium 4.1 3.5 - 5.1 mmol/L   Chloride 100 98 - 111 mmol/L   CO2 23 22 - 32 mmol/L   Glucose, Bld 201 (H) 70 - 99 mg/dL   BUN 13 8 - 23 mg/dL   Creatinine, Ser 0.68 0.44 - 1.00 mg/dL   Calcium 9.1 8.9 - 10.3 mg/dL   Total Protein 7.5 6.5 - 8.1 g/dL   Albumin 3.9 3.5 - 5.0 g/dL   AST 19 15 - 41 U/L   ALT 26 0 - 44 U/L   Alkaline Phosphatase 107 38 - 126 U/L   Total Bilirubin 0.6 0.3 - 1.2 mg/dL   GFR, Estimated >60 >60 mL/min   Anion gap 10 5 - 15      Assessment & Plan:   Problem List Items Addressed This Visit       Cardiovascular and Mediastinum   Migraines    Chronic. Having 3-4 migraines monthly.  Has allergy to triptans.  Will give Nurtec.  Side effects and benefits of medication discussed during visit.  Labs ordered today. Follow up in 1 month.       Relevant Medications   Rimegepant Sulfate (NURTEC) 75 MG TBDP   Hypertension    Chronic.  Controlled.  Continue with current medication regimen.  Labs ordered today.  Return to clinic in 3 months for reevaluation.  Call sooner if concerns arise.        Relevant Orders   Comp Met (CMET)   Chronic heart failure with preserved ejection fraction (HCC) - Primary    Chronic.  Controlled.  Continue with current medication regimen.  Labs ordered today.  Return to clinic in 6 months for reevaluation.   Call sooner if concerns arise.  - Reminded to call for an overnight weight gain of >2 pounds or a weekly weight gain of >5 pounds - not adding salt to food and read food labels. Reviewed the importance of keeping daily sodium intake to <2086m daily. - Avoid Ibuprofen products.       Relevant Orders   Comp Met (CMET)     Digestive   Pancolitis (HCC)    Chronic. Has had a recent flare.  Has an Endoscopy scheduled next week.  Her Entyvio scheduled changed from every 8 weeks to every 4 to help control symptoms. Continue to follow up with GI.        Endocrine   Poorly controlled type 2 diabetes mellitus (HCC)    Chronic. Not well controlled.  Patient not eating regularly.  Discussed importance of consistent eating to prevent low blood sugars.  A1c ordered today.  [facility]  Will stop Glipizide due to low blood sugars in the 80s and patient being symptomatic.  Microalbumin ordered today. Eye exam requested.  Follow up in 1 month.  Call sooner if concerns arise.       Relevant Medications   tirzepatide (MOUNJARO) 2.5 MG/0.5ML Pen   metFORMIN (GLUCOPHAGE) 1000 MG tablet   Other Relevant Orders   HgB A1c     Other   Vitamin D deficiency    Labs ordered at visit today.  Will make recommendations based on lab results.  B12 deficiency    Labs ordered at visit today.  Will make recommendations based on lab results.        Relevant Orders   B12   Iron deficiency anemia    Labs ordered at visit today.  Will make recommendations based on lab results.        Relevant Orders   CBC w/Diff   Major depression, chronic    Chronic. Not well controlled.  Continue to follow up with Psychiatry for further evaluation and management.       Hypertriglyceridemia    Labs ordered at visit today.  Will make recommendations based on lab results.         Relevant Orders   Lipid Profile   Other Visit Diagnoses     Need for influenza vaccination        Relevant Orders   Flu Vaccine QUAD 6+ mos PF IM (Fluarix Quad PF) (Completed)   Need for tetanus booster       Relevant Orders   Tdap vaccine greater than or equal to 7yo IM (Completed)   Encounter for screening mammogram for malignant neoplasm of breast       Relevant Orders   MM 3D SCREEN BREAST BILATERAL   Type 2 diabetes mellitus without complication, without long-term current use of insulin (HCC)       Relevant Medications   tirzepatide (MOUNJARO) 2.5 MG/0.5ML Pen   metFORMIN (GLUCOPHAGE) 1000 MG tablet        Follow up plan: Return in about 1 month (around 06/01/2022) for Blood sugars.

## 2022-05-01 ENCOUNTER — Ambulatory Visit: Payer: BC Managed Care – PPO | Admitting: Nurse Practitioner

## 2022-05-01 ENCOUNTER — Encounter: Payer: Self-pay | Admitting: Nurse Practitioner

## 2022-05-01 ENCOUNTER — Other Ambulatory Visit: Payer: Self-pay | Admitting: Nurse Practitioner

## 2022-05-01 ENCOUNTER — Other Ambulatory Visit: Payer: Self-pay | Admitting: Physician Assistant

## 2022-05-01 VITALS — BP 114/79 | HR 81 | Temp 97.5°F | Wt 145.0 lb

## 2022-05-01 DIAGNOSIS — E559 Vitamin D deficiency, unspecified: Secondary | ICD-10-CM

## 2022-05-01 DIAGNOSIS — E1165 Type 2 diabetes mellitus with hyperglycemia: Secondary | ICD-10-CM | POA: Diagnosis not present

## 2022-05-01 DIAGNOSIS — Z1231 Encounter for screening mammogram for malignant neoplasm of breast: Secondary | ICD-10-CM

## 2022-05-01 DIAGNOSIS — D509 Iron deficiency anemia, unspecified: Secondary | ICD-10-CM

## 2022-05-01 DIAGNOSIS — F329 Major depressive disorder, single episode, unspecified: Secondary | ICD-10-CM

## 2022-05-01 DIAGNOSIS — K5100 Ulcerative (chronic) pancolitis without complications: Secondary | ICD-10-CM | POA: Diagnosis not present

## 2022-05-01 DIAGNOSIS — Z8673 Personal history of transient ischemic attack (TIA), and cerebral infarction without residual deficits: Secondary | ICD-10-CM | POA: Insufficient documentation

## 2022-05-01 DIAGNOSIS — I5032 Chronic diastolic (congestive) heart failure: Secondary | ICD-10-CM

## 2022-05-01 DIAGNOSIS — G43009 Migraine without aura, not intractable, without status migrainosus: Secondary | ICD-10-CM

## 2022-05-01 DIAGNOSIS — E119 Type 2 diabetes mellitus without complications: Secondary | ICD-10-CM

## 2022-05-01 DIAGNOSIS — E781 Pure hyperglyceridemia: Secondary | ICD-10-CM

## 2022-05-01 DIAGNOSIS — Z23 Encounter for immunization: Secondary | ICD-10-CM | POA: Diagnosis not present

## 2022-05-01 DIAGNOSIS — I10 Essential (primary) hypertension: Secondary | ICD-10-CM | POA: Diagnosis not present

## 2022-05-01 DIAGNOSIS — E538 Deficiency of other specified B group vitamins: Secondary | ICD-10-CM

## 2022-05-01 MED ORDER — METFORMIN HCL 1000 MG PO TABS: ORAL_TABLET | ORAL | 1 refills | Status: DC

## 2022-05-01 MED ORDER — TIRZEPATIDE 2.5 MG/0.5ML ~~LOC~~ SOAJ: 2.5000 mg | SUBCUTANEOUS | 0 refills | Status: DC

## 2022-05-01 MED ORDER — NURTEC 75 MG PO TBDP: 1.0000 | ORAL_TABLET | Freq: Every day | ORAL | 0 refills | Status: DC | PRN

## 2022-05-01 NOTE — Assessment & Plan Note (Signed)
Labs ordered at visit today.  Will make recommendations based on lab results.

## 2022-05-01 NOTE — Assessment & Plan Note (Signed)
Chronic. Not well controlled.  Continue to follow up with Psychiatry for further evaluation and management.

## 2022-05-01 NOTE — Telephone Encounter (Signed)
Refilled 05/01/2022 #180 1 rf. Requested Prescriptions  Pending Prescriptions Disp Refills  . metFORMIN (GLUCOPHAGE) 1000 MG tablet [Pharmacy Med Name: metFORMIN HCl 1000 MG Oral Tablet] 180 tablet 0    Sig: Take 1 tablet by mouth twice daily     Endocrinology:  Diabetes - Biguanides Failed - 05/01/2022  8:24 AM      Failed - HBA1C is between 0 and 7.9 and within 180 days    Hemoglobin A1C  Date Value Ref Range Status  03/21/2016 7.3%  Final   HB A1C (BAYER DCA - WAIVED)  Date Value Ref Range Status  10/27/2020 8.7 (H) <7.0 % Final    Comment:                                          Diabetic Adult            <7.0                                       Healthy Adult        4.3 - 5.7                                                           (DCCT/NGSP) American Diabetes Association's Summary of Glycemic Recommendations for Adults with Diabetes: Hemoglobin A1c <7.0%. More stringent glycemic goals (A1c <6.0%) may further reduce complications at the cost of increased risk of hypoglycemia.    Hgb A1c MFr Bld  Date Value Ref Range Status  10/30/2021 9.0 (H) 4.8 - 5.6 % Final    Comment:             Prediabetes: 5.7 - 6.4          Diabetes: >6.4          Glycemic control for adults with diabetes: <7.0          Passed - Cr in normal range and within 360 days    Creatinine  Date Value Ref Range Status  01/10/2012 0.58 (L) 0.60 - 1.30 mg/dL Final   Creatinine, Ser  Date Value Ref Range Status  01/28/2022 0.68 0.44 - 1.00 mg/dL Final         Passed - eGFR in normal range and within 360 days    EGFR (African American)  Date Value Ref Range Status  01/10/2012 >60  Final   GFR calc Af Amer  Date Value Ref Range Status  05/26/2020 112 >59 mL/min/1.73 Final    Comment:    **In accordance with recommendations from the NKF-ASN Task force,**   [facility] is in the process of updating its eGFR calculation to the   2021 CKD-EPI creatinine equation that estimates kidney function    without a race variable.    EGFR (Non-African Amer.)  Date Value Ref Range Status  01/10/2012 >60  Final    Comment:    eGFR values <65m/min/1.73 m2 may be an indication of chronic kidney disease (CKD). Calculated eGFR is useful in patients with stable renal function. The eGFR calculation will not be reliable in acutely ill patients when serum creatinine is changing rapidly.  It is not useful in  patients on dialysis. The eGFR calculation may not be applicable to patients at the low and high extremes of body sizes, pregnant women, and vegetarians.    GFR, Estimated  Date Value Ref Range Status  01/28/2022 >60 >60 mL/min Final    Comment:    (NOTE) Calculated using the CKD-EPI Creatinine Equation (2021)    GFR  Date Value Ref Range Status  03/30/2021 88.73 >60.00 mL/min Final    Comment:    Calculated using the CKD-EPI Creatinine Equation (2021)   eGFR  Date Value Ref Range Status  10/30/2021 99 >59 mL/min/1.73 Final         Passed - B12 Level in normal range and within 720 days    Vitamin B-12  Date Value Ref Range Status  01/18/2022 347 232 - 1,245 pg/mL Final         Passed - Valid encounter within last 6 months    Recent Outpatient Visits          Today Chronic heart failure with preserved ejection fraction ([place])   [facility] [name], NP   2 months ago Poorly controlled type 2 diabetes mellitus ([place])   [place], [place] T, NP   3 months ago Memory change   [facility] Vigg, Avanti, MD   6 months ago Hypertension, unspecified type   [facility] [name], NP   9 months ago Type 2 diabetes mellitus without complication, without long-term current use of insulin ([place])   [facility] [name], NP      Future Appointments            In 1 month [name], NP [place]           Passed - CBC within normal limits and completed in  the last 12 months    WBC  Date Value Ref Range Status  01/28/2022 5.8 4.0 - 10.5 K/uL Final   RBC  Date Value Ref Range Status  01/28/2022 4.40 3.87 - 5.11 MIL/uL Final   Hemoglobin  Date Value Ref Range Status  01/28/2022 11.2 (L) 12.0 - 15.0 g/dL Final  01/18/2022 10.1 (L) 11.1 - 15.9 g/dL Final   HCT  Date Value Ref Range Status  01/28/2022 36.6 36.0 - 46.0 % Final   Hematocrit  Date Value Ref Range Status  01/18/2022 31.9 (L) 34.0 - 46.6 % Final   MCHC  Date Value Ref Range Status  01/28/2022 30.6 30.0 - 36.0 g/dL Final   [facility]  Date Value Ref Range Status  01/28/2022 25.5 (L) 26.0 - 34.0 pg Final   MCV  Date Value Ref Range Status  01/28/2022 83.2 80.0 - 100.0 fL Final  01/18/2022 82 79 - 97 fL Final  01/10/2012 89 80 - 100 fL Final   No results found for: "PLTCOUNTKUC", "LABPLAT", "POCPLA" RDW  Date Value Ref Range Status  01/28/2022 14.9 11.5 - 15.5 % Final  01/18/2022 13.4 11.7 - 15.4 % Final  01/10/2012 12.9 11.5 - 14.5 % Final

## 2022-05-01 NOTE — Assessment & Plan Note (Signed)
Chronic.  Controlled.  Continue with current medication regimen.  Labs ordered today.  Return to clinic in 6 months for reevaluation.  Call sooner if concerns arise.  - Reminded to call for an overnight weight gain of >2 pounds or a weekly weight gain of >5 pounds - not adding salt to food and read food labels. Reviewed the importance of keeping daily sodium intake to <2095m daily. - Avoid Ibuprofen products.

## 2022-05-01 NOTE — Assessment & Plan Note (Signed)
>>  ASSESSMENT AND PLAN FOR DIABETES MELLITUS TYPE 2 IN NONOBESE (HCC) WRITTEN ON 05/01/2022  9:39 AM BY HOLDSWORTH, KAREN, NP  Chronic. Not well controlled.  Patient not eating regularly.  Discussed importance of consistent eating to prevent low blood sugars.  A1c ordered today.  Will change from Trulicity to [facility].  Will stop Glipizide due to low blood sugars in the 80s and patient being symptomatic.  Microalbumin ordered today. Eye exam requested.  Follow up in 1 month.  Call sooner if concerns arise.

## 2022-05-01 NOTE — Assessment & Plan Note (Signed)
Chronic. Not well controlled.  Patient not eating regularly.  Discussed importance of consistent eating to prevent low blood sugars.  A1c ordered today.  Will change from Trulicity to [facility].  Will stop Glipizide due to low blood sugars in the 80s and patient being symptomatic.  Microalbumin ordered today. Eye exam requested.  Follow up in 1 month.  Call sooner if concerns arise.

## 2022-05-01 NOTE — Assessment & Plan Note (Signed)
Chronic. Has had a recent flare.  Has an Endoscopy scheduled next week.  Her Entyvio scheduled changed from every 8 weeks to every 4 to help control symptoms. Continue to follow up with GI.

## 2022-05-01 NOTE — Assessment & Plan Note (Signed)
Chronic.  Controlled.  Continue with current medication regimen.  Labs ordered today.  Return to clinic in 3 months for reevaluation.  Call sooner if concerns arise.

## 2022-05-01 NOTE — Assessment & Plan Note (Signed)
Chronic. Having 3-4 migraines monthly.  Has allergy to triptans.  Will give Nurtec.  Side effects and benefits of medication discussed during visit.  Labs ordered today. Follow up in 1 month.

## 2022-05-02 ENCOUNTER — Ambulatory Visit (AMBULATORY_SURGERY_CENTER): Payer: BC Managed Care – PPO | Admitting: Gastroenterology

## 2022-05-02 ENCOUNTER — Telehealth: Payer: Self-pay

## 2022-05-02 ENCOUNTER — Encounter: Payer: Self-pay | Admitting: Gastroenterology

## 2022-05-02 VITALS — BP 120/70 | HR 73 | Temp 96.6°F | Resp 11

## 2022-05-02 DIAGNOSIS — K3189 Other diseases of stomach and duodenum: Secondary | ICD-10-CM

## 2022-05-02 DIAGNOSIS — D509 Iron deficiency anemia, unspecified: Secondary | ICD-10-CM | POA: Diagnosis not present

## 2022-05-02 LAB — CBC WITH DIFFERENTIAL/PLATELET
Basophils Absolute: 0 10*3/uL (ref 0.0–0.2)
Basos: 0 %
EOS (ABSOLUTE): 0.1 10*3/uL (ref 0.0–0.4)
Eos: 1 %
Hematocrit: 33.9 % — ABNORMAL LOW (ref 34.0–46.6)
Hemoglobin: 10.9 g/dL — ABNORMAL LOW (ref 11.1–15.9)
Immature Grans (Abs): 0 10*3/uL (ref 0.0–0.1)
Immature Granulocytes: 1 %
Lymphocytes Absolute: 1.7 10*3/uL (ref 0.7–3.1)
Lymphs: 30 %
MCH: 25.2 pg — ABNORMAL LOW (ref 26.6–33.0)
MCHC: 32.2 g/dL (ref 31.5–35.7)
MCV: 79 fL (ref 79–97)
Monocytes Absolute: 0.5 10*3/uL (ref 0.1–0.9)
Monocytes: 9 %
Neutrophils Absolute: 3.4 10*3/uL (ref 1.4–7.0)
Neutrophils: 59 %
Platelets: 387 10*3/uL (ref 150–450)
RBC: 4.32 x10E6/uL (ref 3.77–5.28)
RDW: 14.4 % (ref 11.7–15.4)
WBC: 5.6 10*3/uL (ref 3.4–10.8)

## 2022-05-02 LAB — COMPREHENSIVE METABOLIC PANEL
ALT: 19 IU/L (ref 0–32)
AST: 22 IU/L (ref 0–40)
Albumin/Globulin Ratio: 1.6 (ref 1.2–2.2)
Albumin: 4.2 g/dL (ref 3.9–4.9)
Alkaline Phosphatase: 147 IU/L — ABNORMAL HIGH (ref 44–121)
BUN/Creatinine Ratio: 13 (ref 12–28)
BUN: 10 mg/dL (ref 8–27)
Bilirubin Total: <0.2 mg/dL (ref 0.0–1.2)
CO2: 22 mmol/L (ref 20–29)
Calcium: 8.9 mg/dL (ref 8.7–10.3)
Chloride: 93 mmol/L — ABNORMAL LOW (ref 96–106)
Creatinine, Ser: 0.8 mg/dL (ref 0.57–1.00)
Globulin, Total: 2.7 g/dL (ref 1.5–4.5)
Glucose: 136 mg/dL — ABNORMAL HIGH (ref 70–99)
Potassium: 4.6 mmol/L (ref 3.5–5.2)
Sodium: 130 mmol/L — ABNORMAL LOW (ref 134–144)
Total Protein: 6.9 g/dL (ref 6.0–8.5)
eGFR: 82 mL/min/{1.73_m2} (ref >59)

## 2022-05-02 LAB — LIPID PANEL
Chol/HDL Ratio: 3 ratio (ref 0.0–4.4)
Cholesterol, Total: 145 mg/dL (ref 100–199)
HDL: 49 mg/dL (ref >39)
LDL Chol Calc (NIH): 69 mg/dL (ref 0–99)
Triglycerides: 159 mg/dL — ABNORMAL HIGH (ref 0–149)
VLDL Cholesterol Cal: 27 mg/dL (ref 5–40)

## 2022-05-02 LAB — VITAMIN B12: Vitamin B-12: 303 pg/mL (ref 232–1245)

## 2022-05-02 LAB — HEMOGLOBIN A1C
Est. average glucose Bld gHb Est-mCnc: 203 mg/dL
Hgb A1c MFr Bld: 8.7 % — ABNORMAL HIGH (ref 4.8–5.6)

## 2022-05-02 MED ORDER — OMEPRAZOLE 40 MG PO CPDR: DELAYED_RELEASE_CAPSULE | ORAL | 1 refills | Status: DC

## 2022-05-02 MED ORDER — SODIUM CHLORIDE 0.9 % IV SOLN: 500.0000 mL | Freq: Once | INTRAVENOUS | Status: DC

## 2022-05-02 NOTE — Progress Notes (Signed)
[facility] History and Physical   Primary Care Physician:  [name], NP   Reason for Procedure:   Iron deficiency  Plan:    EGD     HPI: Amy Hansen is a 66 y.o. female  here for EGD to evaluate iron deficiency. History of colitis on Entyvio, recent colonoscopy showed no significant inflammation. EGD to clear upper tract in light of anemia / IDA. She does endorse some poor appetite. Otherwise feels well today without complaints. No cardiopulmonary symptoms.   I have discussed risks / benefits of anesthesia and endoscopic procedure with [name] and they wish to proceed with the exams as outlined today.    Past Medical History:  Diagnosis Date   Anemia    Anxiety    Arthritis    Blood transfusion without reported diagnosis    Cataract    CHF (congestive heart failure) ([place])    Chronic kidney disease    UTI, hematuria in urine   Colitis    Crohn's disease ([place])    Depression    Diabetes ([place])    Diverticulosis    Frequent headaches    Interstitial cystitis    Recurrent UTI    Restless leg syndrome    TIA (transient ischemic attack)    more than year ago per pt 12-26-21   TIA (transient ischemic attack) 02/20/2021   Urinary frequency     Past Surgical History:  Procedure Laterality Date   bariatric bypass  2012   BIOPSY  05/03/2020   Procedure: BIOPSY;  Surgeon: Milus Banister, MD;  Location: [place];  Service: Endoscopy;;   CARPAL TUNNEL RELEASE Right 2003   CARPAL TUNNEL RELEASE Right    2008   CHOLECYSTECTOMY  1975   COLONOSCOPY     COLONOSCOPY WITH PROPOFOL N/A 05/03/2020   Procedure: COLONOSCOPY WITH PROPOFOL;  Surgeon: Milus Banister, MD;  Location: [place];  Service: Endoscopy;  Laterality: N/A;   CYSTOSCOPY W/ RETROGRADES Bilateral 06/06/2015   Procedure: CYSTOSCOPY WITH RETROGRADE PYELOGRAM;  Surgeon: Festus Aloe, MD;  Location: [facility];  Service: Urology;  Laterality: Bilateral;   FL INJ LEFT KNEE CT  ARTHROGRAM ([facility] HX) Left    1995   GASTRIC BYPASS  2010   HAND SURGERY Left 01/19/2021   Thumb   HEMORRHOID SURGERY  2013   KNEE ARTHROSCOPY Left 1996   TONSILLECTOMY     TOTAL ABDOMINAL HYSTERECTOMY W/ BILATERAL SALPINGOOPHORECTOMY      Prior to Admission medications   Medication Sig Start Date End Date Taking? Authorizing Provider  ALPRAZolam Duanne Moron) 1 MG tablet Take 1 tablet (1 mg total) by mouth 3 (three) times daily as needed. for anxiety 11/30/21  Yes Hurst, Teresa T, PA-C  buPROPion (WELLBUTRIN XL) 150 MG 24 hr tablet Take 3 tablets (450 mg total) by mouth daily. 11/30/21  Yes Donnal Moat T, PA-C  furosemide (LASIX) 20 MG tablet Take 1 tablet (20 mg total) by mouth daily. May take an additional tablet (46m) AS NEEDED for weight gain and/or swelling. 06/07/21 06/02/22 Yes End, CHarrell Gave MD  lamoTRIgine (LAMICTAL) 200 MG tablet Take 2 tablets (400 mg total) by mouth at bedtime. 03/01/22  Yes HDonnal MoatT, PA-C  metFORMIN (GLUCOPHAGE) 1000 MG tablet Take 1 tablet by mouth twice daily. 05/01/22  Yes HJon Billings NP  ondansetron (ZOFRAN-ODT) 4 MG disintegrating tablet Take 1 tablet (4 mg total) by mouth every 8 (eight) hours as needed for nausea or vomiting. Please keep your upcoming appointment in  March for further refills. 09/11/21  Yes Harshaan Whang, Carlota Raspberry, MD  QUEtiapine (SEROQUEL) 300 MG tablet TAKE 1 TABLET BY MOUTH AT BEDTIME 05/01/22  Yes Hurst, Teresa T, PA-C  rOPINIRole (REQUIP) 1 MG tablet Take 1 tablet (1 mg total) by mouth 2 (two) times daily. 02/12/22  Yes Marnee Guarneri T, NP  sertraline (ZOLOFT) 100 MG tablet Take 2 tablets by mouth once daily 05/01/22  Yes Hurst, Teresa T, PA-C  tirzepatide [facility]) 2.5 MG/0.5ML Pen Inject 2.5 mg into the skin once a week. 05/01/22  Yes [name], NP  Vitamin D, Ergocalciferol, (DRISDOL) 1.25 MG (50000 UNIT) CAPS capsule Take 1 capsule by mouth once a week 03/22/22  Yes Hurst, Teresa T, PA-C  Rimegepant Sulfate (NURTEC) 75 MG  TBDP Take 1 tablet by mouth daily as needed. 05/01/22   [name], NP  vedolizumab (ENTYVIO) 300 MG injection Inject into the vein every 8 (eight) weeks.    [provider]    Current Outpatient Medications  Medication Sig Dispense Refill   ALPRAZolam (XANAX) 1 MG tablet Take 1 tablet (1 mg total) by mouth 3 (three) times daily as needed. for anxiety 90 tablet 5   buPROPion (WELLBUTRIN XL) 150 MG 24 hr tablet Take 3 tablets (450 mg total) by mouth daily. 270 tablet 3   furosemide (LASIX) 20 MG tablet Take 1 tablet (20 mg total) by mouth daily. May take an additional tablet (21m) AS NEEDED for weight gain and/or swelling. 90 tablet 3   lamoTRIgine (LAMICTAL) 200 MG tablet Take 2 tablets (400 mg total) by mouth at bedtime. 60 tablet 11   metFORMIN (GLUCOPHAGE) 1000 MG tablet Take 1 tablet by mouth twice daily. 180 tablet 1   ondansetron (ZOFRAN-ODT) 4 MG disintegrating tablet Take 1 tablet (4 mg total) by mouth every 8 (eight) hours as needed for nausea or vomiting. Please keep your upcoming appointment in March for further refills. 30 tablet 0   QUEtiapine (SEROQUEL) 300 MG tablet TAKE 1 TABLET BY MOUTH AT BEDTIME 90 tablet 0   rOPINIRole (REQUIP) 1 MG tablet Take 1 tablet (1 mg total) by mouth 2 (two) times daily. 180 tablet 1   sertraline (ZOLOFT) 100 MG tablet Take 2 tablets by mouth once daily 60 tablet 3   tirzepatide (MOUNJARO) 2.5 MG/0.5ML Pen Inject 2.5 mg into the skin once a week. 2 mL 0   Vitamin D, Ergocalciferol, (DRISDOL) 1.25 MG (50000 UNIT) CAPS capsule Take 1 capsule by mouth once a week 2 capsule 0   Rimegepant Sulfate (NURTEC) 75 MG TBDP Take 1 tablet by mouth daily as needed. 10 tablet 0   vedolizumab (ENTYVIO) 300 MG injection Inject into the vein every 8 (eight) weeks.     Current Facility-Administered Medications  Medication Dose Route Frequency Provider Last Rate Last Admin   0.9 %  sodium chloride infusion  500 mL Intravenous Once Pinkie Manger, SCarlota Raspberry  MD        Allergies as of 05/02/2022 - Review Complete 05/02/2022  Allergen Reaction Noted   Avelox [moxifloxacin hcl in nacl] Anaphylaxis 04/19/2015   Bactrim [sulfamethoxazole-trimethoprim] Anaphylaxis 06/17/2018   Ciprofloxacin Other (See Comments) 10/15/2018   Depakote [divalproex sodium] Other (See Comments) 06/01/2018   Imitrex [sumatriptan] Other (See Comments) 05/21/2017   Stadol [butorphanol] Rash 04/19/2015    Family History  Problem Relation Age of Onset   Stroke Father    Colon cancer Mother    Heart failure Sister    Bladder Cancer Neg Hx  Kidney disease Neg Hx    Prostate cancer Neg Hx    Kidney cancer Neg Hx    Pancreatic cancer Neg Hx    Esophageal cancer Neg Hx    Stomach cancer Neg Hx    Rectal cancer Neg Hx     Social History   Socioeconomic History   Marital status: Married    Spouse name: Not on file   Number of children: Not on file   Years of education: Not on file   Highest education level: Not on file  Occupational History   Not on file  Tobacco Use   Smoking status: Former    Packs/day: 0.50    Types: Cigarettes    Quit date: 04/25/1975    Years since quitting: 47.0   Smokeless tobacco: Never   Tobacco comments:    quit 40 years  Vaping Use   Vaping Use: Never used  Substance and Sexual Activity   Alcohol use: No    Alcohol/week: 0.0 standard drinks of alcohol   Drug use: No   Sexual activity: Not Currently    Birth control/protection: Post-menopausal, Surgical  Other Topics Concern   Not on file  Social History Narrative   Caffeine 5 servings per day.   Social Determinants of Health   Financial Resource Strain: Medium Risk (04/12/2021)   Overall Financial Resource Strain (CARDIA)    Difficulty of Paying Living Expenses: Somewhat hard  Food Insecurity: No Food Insecurity (03/06/2022)   Hunger Vital Sign    Worried About Running Out of Food in the Last Year: Never true    Ran Out of Food in the Last Year: Never true   Transportation Needs: No Transportation Needs (03/06/2022)   PRAPARE - Hydrologist (Medical): No    Lack of Transportation (Non-Medical): No  Physical Activity: Inactive (04/12/2021)   Exercise Vital Sign    Days of Exercise per Week: 0 days    Minutes of Exercise per Session: 0 min  Stress: No Stress Concern Present (03/06/2022)   [place]    Feeling of Stress : Only a little  Social Connections: Moderately Integrated (03/06/2022)   Social Connection and Isolation Panel [NHANES]    Frequency of Communication with Friends and Family: More than three times a week    Frequency of Social Gatherings with Friends and Family: More than three times a week    Attends Religious Services: More than 4 times per year    Active Member of [employer] or Organizations: No    Attends Archivist Meetings: Never    Marital Status: Married  Human resources officer Violence: Not At Risk (03/06/2022)   Humiliation, Afraid, Rape, and Kick questionnaire    Fear of Current or Ex-Partner: No    Emotionally Abused: No    Physically Abused: No    Sexually Abused: No    Review of Systems: All other review of systems negative except as mentioned in the HPI.  Physical Exam: Vital signs BP 112/69   Pulse 69   Temp (!) 96.6 F (35.9 C)   Resp 15   LMP  (LMP Unknown)   SpO2 98%   General:   Alert,  Well-developed, pleasant and cooperative in NAD Lungs:  Clear throughout to auscultation.   Heart:  Regular rate and rhythm Abdomen:  Soft, nontender and nondistended.   Neuro/Psych:  Alert and cooperative. Normal mood and affect. A and O x 3  Richardson Landry  Havery Moros, MD [facility]

## 2022-05-02 NOTE — Progress Notes (Signed)
Pt's states no medical or surgical changes since previsit or office visit. 

## 2022-05-02 NOTE — Progress Notes (Signed)
Hi Amy Hansen. It was good to see you yesterday.  Your A1c improved from 9.0 to 8.7.  I want you to stop the Glipizide due to feeling like you are having low blood sugars.  I also changed the prescription from Trulicity to [facility].  I think this will be the extra push to get your A1c better controlled. Your Anemia is slightly down from prior.  But that is likely due to the Chron's Colitis.  Follow up with your GI.  Keep appt for next month so we can continue to titrate your medication.

## 2022-05-02 NOTE — Progress Notes (Signed)
A and O x3. Report to RN. Tolerated MAC anesthesia well.Teeth unchanged after procedure. 

## 2022-05-02 NOTE — Op Note (Signed)
[place] Patient Name: Amy Hansen Procedure Date: 05/02/2022 11:52 AM MRN: [id number] Endoscopist: Remo Lipps P. Havery Moros , MD Age: 66 Referring MD:  Date of Birth: 1955-08-14 Gender: Female Account #: [id number] Procedure:                Upper GI endoscopy Indications:              Iron deficiency anemia. History of colitis which is                            well controlled on most recent colonoscopy. History                            of gastric bypass several years ago Medicines:                Monitored Anesthesia Care Procedure:                Pre-Anesthesia Assessment:                           - Prior to the procedure, a History and Physical                            was performed, and patient medications and                            allergies were reviewed. The patient's tolerance of                            previous anesthesia was also reviewed. The risks                            and benefits of the procedure and the sedation                            options and risks were discussed with the patient.                            All questions were answered, and informed consent                            was obtained. Prior Anticoagulants: The patient has                            taken no previous anticoagulant or antiplatelet                            agents. ASA Grade Assessment: III - A patient with                            severe systemic disease. After reviewing the risks                            and benefits, the patient was deemed in  satisfactory condition to undergo the procedure.                           After obtaining informed consent, the endoscope was                            passed under direct vision. Throughout the                            procedure, the patient's blood pressure, pulse, and                            oxygen saturations were monitored continuously. The                             Endoscope was introduced through the mouth, and                            advanced to the jejunum. The upper GI endoscopy was                            accomplished without difficulty. The patient                            tolerated the procedure well. Scope In: Scope Out: Findings:                 Esophagogastric landmarks were identified: the                            Z-line was found at 34 cm, the gastroesophageal                            junction was found at 34 cm and the upper extent of                            the gastric folds was found at 34 cm from the                            incisors.                           The exam of the esophagus was otherwise normal.                           Evidence of a Roux-en-Y gastrojejunostomy was                            found. The gastrojejunal anastomosis was                            characterized by 2 small areas of ulceration.                           The gastric pouch was otherwise  small but normal.                           Biopsies were taken with a cold forceps for                            Helicobacter pylori testing.                           The examined small bowel limb and blind loop were                            normal. Complications:            No immediate complications. Estimated blood loss:                            Minimal. Estimated Blood Loss:     Estimated blood loss was minimal. Impression:               - Esophagogastric landmarks identified.                           - Normal esophagus                           - Roux-en-Y gastrojejunostomy with gastrojejunal                            anastomosis characterized by ulceration - 2 small                            marginal ulcers                           - Small gastric pouch which was otherwise normal.                            Biopsies taken to rule out H pylori.                           - Normal examined small bowel limb. Recommendation:            - Patient has a contact number available for                            emergencies. The signs and symptoms of potential                            delayed complications were discussed with the                            patient. Return to normal activities tomorrow.                            Written discharge instructions were provided to the  patient.                           - Resume previous diet.                           - Continue present medications.                           - Avoid NSAIDs                           - Start ferrous sulfate 376m / day if not already                            taking                           - Start omeprazole capsule - 418monce daily - open                            capsule and ingest contents - for 6 weeks                           - Await pathology results, trend labs over time. StRemo Lipps. Enda Santo, MD 05/02/2022 12:16:02 PM This report has been signed electronically.

## 2022-05-02 NOTE — Telephone Encounter (Signed)
PA for Nurtec has been initiated via covermymeds. Awaiting determination   Key: [id number]

## 2022-05-02 NOTE — Progress Notes (Signed)
Called to room to assist during endoscopic procedure.  Patient ID and intended procedure confirmed with present staff. Received instructions for my participation in the procedure from the performing physician.  

## 2022-05-02 NOTE — Patient Instructions (Addendum)
  Start Omeprazole 40 mg daily for 6 weeks- order sent to you pharmacy  Avoid Nsaids-Ibuprofen,Aleve,Advil ,etc  Start Ferrous Sulfate 325 mg per day if not already taking   Await pathology results on biopsies done    YOU HAD AN ENDOSCOPIC PROCEDURE TODAY AT [place]:   Refer to the procedure report that was given to you for any specific questions about what was found during the examination.  If the procedure report does not answer your questions, please call your gastroenterologist to clarify.  If you requested that your care partner not be given the details of your procedure findings, then the procedure report has been included in a sealed envelope for you to review at your convenience later.  YOU SHOULD EXPECT: Some feelings of bloating in the abdomen. Passage of more gas than usual.  Walking can help get rid of the air that was put into your GI tract during the procedure and reduce the bloating. If you had a lower endoscopy (such as a colonoscopy or flexible sigmoidoscopy) you may notice spotting of blood in your stool or on the toilet paper. If you underwent a bowel prep for your procedure, you may not have a normal bowel movement for a few days.  Please Note:  You might notice some irritation and congestion in your nose or some drainage.  This is from the oxygen used during your procedure.  There is no need for concern and it should clear up in a day or so.  SYMPTOMS TO REPORT IMMEDIATELY:   Following upper endoscopy (EGD)  Vomiting of blood or coffee ground material  New chest pain or pain under the shoulder blades  Painful or persistently difficult swallowing  New shortness of breath  Fever of 100F or higher  Black, tarry-looking stools  For urgent or emergent issues, a gastroenterologist can be reached at any hour by calling [phone]. Do not use MyChart messaging for urgent concerns.    DIET:  We do recommend a small meal at first, but then you may  proceed to your regular diet.  Drink plenty of fluids but you should avoid alcoholic beverages for 24 hours.  ACTIVITY:  You should plan to take it easy for the rest of today and you should NOT DRIVE or use heavy machinery until tomorrow (because of the sedation medicines used during the test).    FOLLOW UP: Our staff will call the number listed on your records the next business day following your procedure.  We will call around 7:15- 8:00 am to check on you and address any questions or concerns that you may have regarding the information given to you following your procedure. If we do not reach you, we will leave a message.     If any biopsies were taken you will be contacted by phone or by letter within the next 1-3 weeks.  Please call us at [phone] if you have not heard about the biopsies in 3 weeks.    SIGNATURES/CONFIDENTIALITY: You and/or your care partner have signed paperwork which will be entered into your electronic medical record.  These signatures attest to the fact that that the information above on your After Visit Summary has been reviewed and is understood.  Full responsibility of the confidentiality of this discharge information lies with you and/or your care-partner.

## 2022-05-03 ENCOUNTER — Telehealth: Payer: Self-pay

## 2022-05-03 NOTE — Telephone Encounter (Signed)
  Follow up Call-     05/02/2022   11:07 AM 02/13/2022    9:09 AM 12/26/2021    9:50 AM 10/06/2020    3:48 PM  Call back number  Post procedure Call Back phone  # [phone]  Permission to leave phone message Yes Yes Yes Yes     Patient questions:  Do you have a fever, pain , or abdominal swelling? No. Pain Score  0 *  Have you tolerated food without any problems? Yes.    Have you been able to return to your normal activities? Yes.    Do you have any questions about your discharge instructions: Diet   No. Medications  No. Follow up visit  No.  Do you have questions or concerns about your Care? No.  Actions: * If pain score is 4 or above: No action needed, pain <4.

## 2022-05-05 ENCOUNTER — Encounter: Payer: Self-pay | Admitting: Emergency Medicine

## 2022-05-05 ENCOUNTER — Other Ambulatory Visit: Payer: Self-pay

## 2022-05-05 DIAGNOSIS — H5789 Other specified disorders of eye and adnexa: Secondary | ICD-10-CM | POA: Diagnosis not present

## 2022-05-05 DIAGNOSIS — H04323 Acute dacryocystitis of bilateral lacrimal passages: Secondary | ICD-10-CM | POA: Insufficient documentation

## 2022-05-05 DIAGNOSIS — Q105 Congenital stenosis and stricture of lacrimal duct: Secondary | ICD-10-CM | POA: Diagnosis not present

## 2022-05-05 LAB — COMPREHENSIVE METABOLIC PANEL
ALT: 19 U/L (ref 0–44)
AST: 22 U/L (ref 15–41)
Albumin: 3.4 g/dL — ABNORMAL LOW (ref 3.5–5.0)
Alkaline Phosphatase: 106 U/L (ref 38–126)
Anion gap: 8 (ref 5–15)
BUN: 12 mg/dL (ref 8–23)
CO2: 24 mmol/L (ref 22–32)
Calcium: 8.6 mg/dL — ABNORMAL LOW (ref 8.9–10.3)
Chloride: 101 mmol/L (ref 98–111)
Creatinine, Ser: 0.78 mg/dL (ref 0.44–1.00)
GFR, Estimated: >60 mL/min (ref >60)
Glucose, Bld: 178 mg/dL — ABNORMAL HIGH (ref 70–99)
Potassium: 3.7 mmol/L (ref 3.5–5.1)
Sodium: 133 mmol/L — ABNORMAL LOW (ref 135–145)
Total Bilirubin: 0.5 mg/dL (ref 0.3–1.2)
Total Protein: 6.9 g/dL (ref 6.5–8.1)

## 2022-05-05 LAB — CBC
HCT: 32.5 % — ABNORMAL LOW (ref 36.0–46.0)
Hemoglobin: 9.9 g/dL — ABNORMAL LOW (ref 12.0–15.0)
MCH: 24.5 pg — ABNORMAL LOW (ref 26.0–34.0)
MCHC: 30.5 g/dL (ref 30.0–36.0)
MCV: 80.4 fL (ref 80.0–100.0)
Platelets: 333 10*3/uL (ref 150–400)
RBC: 4.04 MIL/uL (ref 3.87–5.11)
RDW: 15.5 % (ref 11.5–15.5)
WBC: 5.5 10*3/uL (ref 4.0–10.5)
nRBC: 0 % (ref 0.0–0.2)

## 2022-05-05 NOTE — ED Triage Notes (Signed)
FIRST NURSE NOTE:  Pt arrived via POV, Dr. Lazarus Salines from [facility] called stating pt concerned for eye infection, scheduled to have cyst drained soon.  Referred to ED for antibiotics and pain management.

## 2022-05-05 NOTE — ED Triage Notes (Signed)
Pt presents to ER from home. Pt reports left eye is more watery that it has been, feels like a cyst that she has in left eye has increased in size. Pt called [facility] due to eye pain and left side of face pain. 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 Pt reports some discharge from left eye. Pt talks in complete sentences no respiratory distress noted

## 2022-05-06 ENCOUNTER — Emergency Department: Payer: BC Managed Care – PPO

## 2022-05-06 ENCOUNTER — Other Ambulatory Visit: Payer: Self-pay

## 2022-05-06 ENCOUNTER — Emergency Department
Admission: EM | Admit: 2022-05-06 | Discharge: 2022-05-06 | Disposition: A | Discharge status: Home / Self Care | Payer: BC Managed Care – PPO | Attending: Emergency Medicine | Admitting: Emergency Medicine

## 2022-05-06 DIAGNOSIS — D509 Iron deficiency anemia, unspecified: Secondary | ICD-10-CM

## 2022-05-06 DIAGNOSIS — K50119 Crohn's disease of large intestine with unspecified complications: Secondary | ICD-10-CM

## 2022-05-06 DIAGNOSIS — H0469 Other changes of lacrimal passages: Secondary | ICD-10-CM

## 2022-05-06 MED ORDER — OXYCODONE HCL 5 MG PO TABS
5.0000 mg | ORAL_TABLET | Freq: Once | ORAL | Status: AC
  Administered 2022-05-06: 5 mg via ORAL
  Filled 2022-05-06: qty 1

## 2022-05-06 MED ORDER — NEOMYCIN-POLYMYXIN-DEXAMETH 3.5-10000-0.1 OP SUSP: 2.0000 [drp] | Freq: Three times a day (TID) | OPHTHALMIC | 0 refills | Status: DC

## 2022-05-06 MED ORDER — OXYCODONE HCL 5 MG PO TABS: 5.0000 mg | ORAL_TABLET | Freq: Three times a day (TID) | ORAL | 0 refills | Status: DC | PRN

## 2022-05-06 MED ORDER — ACETAMINOPHEN 500 MG PO TABS
1000.0000 mg | ORAL_TABLET | Freq: Once | ORAL | Status: AC
  Administered 2022-05-06: 1000 mg via ORAL
  Filled 2022-05-06: qty 2

## 2022-05-06 NOTE — Discharge Instructions (Addendum)
Use Tylenol for pain and fevers.  Up to 1000 mg per dose, up to 4 times per day.  Do not take more than 4000 mg of Tylenol/acetaminophen within 24 hours..  Use oxycodone for more severe pain  Use the Maxitrol eye drops three times per day

## 2022-05-06 NOTE — ED Provider Notes (Signed)
[facility] Provider Note    Event Date/Time   First MD Initiated Contact with Patient 05/06/22 [phone]     (approximate)   History   Eye Problem   HPI  Amy Hansen is a 66 y.o. female who presents to the ED for evaluation of Eye Problem   Patient presents to the ED at the direction of on-call ophthalmologist for evaluation of worsening pain to her left eye.  She was recently diagnosed with dacryocystocele and has an upcoming procedure planned for the beginning of December for removal and reconstruction of the lacrimal duct.  She reports the past couple days feeling like the palpable cyst at the medial aspect of her left eye has been larger and more tender to palpation.  Reports chronic blurry vision to the left eye without any acute vision changes, falls or trauma.  Denies fevers or systemic symptoms.  She called the on-call ophthalmologist who urged her to come to the ED to get checked out   Physical Exam   Triage Vital Signs: ED Triage Vitals [05/05/22 2302]  Enc Vitals Group     BP (!) 144/91     Pulse Rate 88     Resp 16     Temp (!) 97.5 F (36.4 C)     Temp Source Oral     SpO2 96 %     Weight 143 lb (64.9 kg)     Height 5' (1.524 m)     Head Circumference      Peak Flow      Pain Score 10     Pain Loc      Pain Edu?      Excl. in [place]?     Most recent vital signs: Vitals:   05/05/22 2302 05/06/22 0113  BP: (!) 144/91 117/82  Pulse: 88 74  Resp: 16 18  Temp: (!) 97.5 F (36.4 C) 97.7 F (36.5 C)  SpO2: 96% 98%    General: Awake, no distress.  Pleasant and conversational.  Looks well. CV:  Good peripheral perfusion.  Resp:  Normal effort.  Abd:  No distention.  MSK:  No deformity noted.  Neuro:  No focal deficits appreciated. Other:  No notable external or skin changes to the area in question medial to her medial canthus of the left eye.  With palpation, I cannot appreciate a subcutaneous cystic structure that feels perhaps  8 mm - 1 cm in size.  Mildly tender to palpation without any induration or surrounding skin changes or tenderness.  No signs of EOM entrapment.   ED Results / Procedures / Treatments   Labs (all labs ordered are listed, but only abnormal results are displayed) Labs Reviewed  CBC - Abnormal; Notable for the following components:      Result Value   Hemoglobin 9.9 (*)    HCT 32.5 (*)    MCH 24.5 (*)    All other components within normal limits  COMPREHENSIVE METABOLIC PANEL - Abnormal; Notable for the following components:   Sodium 133 (*)    Glucose, Bld 178 (*)    Calcium 8.6 (*)    Albumin 3.4 (*)    All other components within normal limits    EKG   RADIOLOGY   Official radiology report(s): No results found.  PROCEDURES and INTERVENTIONS:  Procedures  Medications  acetaminophen (TYLENOL) tablet 1,000 mg (1,000 mg Oral Given 05/06/22 0110)  oxyCODONE (Oxy IR/ROXICODONE) immediate release tablet 5 mg (5 mg Oral Given  05/06/22 0110)     IMPRESSION / MDM / ASSESSMENT AND PLAN / ED COURSE  I reviewed the triage vital signs and the nursing notes.  Differential diagnosis includes, but is not limited to, dacryocystitis, local pain, MSK pain, stroke, migraine  {Patient presents with symptoms of an acute illness or injury that is potentially life-threatening.  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  She looks systemically well with normal vital signs and reassuring screening labs without signs of leukocytosis or sepsis.  CMP is essentially normal with some mild hyperglycemia, but no acidosis.  I consult with ophthalmology who is reassured by my examination.  Recommends Maxitrol ointment and systemic pain medications and patient can follow-up on Monday with ophthalmology.  Clinical Course as of 05/06/22 9150  Nancy Fetter May 06, 2022  0112 I consult with Dr Lazarus Salines, ophtho. Can try combo abx/steroid eye drops. Pain meds. Maxatrol ointment, TID [DS]    Clinical Course User Index [DS] Vladimir Crofts, MD     FINAL CLINICAL IMPRESSION(S) / ED DIAGNOSES   Final diagnoses:  Dacryocystocele     Rx / DC Orders   ED Discharge Orders          Ordered    neomycin-polymyxin b-dexamethasone (MAXITROL) 3.5-10000-0.1 SUSP  3 times daily        05/06/22 0116    oxyCODONE (ROXICODONE) 5 MG immediate release tablet  Every 8 hours PRN        05/06/22 0117             Note:  This document was prepared using Dragon voice recognition software and may include unintentional dictation errors.   Vladimir Crofts, MD 05/06/22 [phone]

## 2022-05-06 NOTE — ED Notes (Signed)
Pt verbalized understanding of discharge. Pts husband at bedside and is pts ride home

## 2022-05-06 NOTE — Progress Notes (Signed)
Due for labs in 8 weeks (CBC and TIBC/Ferritin)

## 2022-05-07 ENCOUNTER — Telehealth: Payer: Self-pay

## 2022-05-07 NOTE — Telephone Encounter (Signed)
Patient has tried antidepressants, depakote and Triptans.

## 2022-05-07 NOTE — Telephone Encounter (Signed)
Transition Care Management Follow-up Telephone Call Date of discharge and from where: 05/06/22, [facility] How have you been since you were released from the hospital? Patient states she has gotten better. States she was seen in the ER because of a painful cyst she has behind her L eye. She states she was given eye drops and pain medicine at the ER. Eye drops made her eyes burn per patient. States the cyst drained in the middle of the night last night. Patient states she contacted her eye doctor this morning and was given different eye drops to use. States she has surgery scheduled to have this cyst removed in December.  Any questions or concerns? No  Items Reviewed: Did the pt receive and understand the discharge instructions provided? Yes  Medications obtained and verified? Yes  Other? No  Any new allergies since your discharge? Yes - added to the patient's allergy and intolerance list Dietary orders reviewed? Yes Do you have support at home? Yes   Home Care and Equipment/Supplies: Were home health services ordered? no If so, what is the name of the agency? N/A  Has the agency set up a time to come to the patient's home? not applicable Were any new equipment or medical supplies ordered?  No What is the name of the medical supply agency? N/A Were you able to get the supplies/equipment? not applicable Do you have any questions related to the use of the equipment or supplies? No  Functional Questionnaire: (I = Independent and D = Dependent) ADLs: I  Bathing/Dressing- I  Meal Prep- I  Eating- I  Maintaining continence- I  Transferring/Ambulation- I  Managing Meds- I  Follow up appointments reviewed:  PCP Hospital f/u appt confirmed? No   Specialist Hospital f/u appt confirmed? No   Are transportation arrangements needed? No  If their condition worsens, is the pt aware to call PCP or go to the Emergency Dept.? Yes Was the patient provided with contact information for the PCP's office  or ED? Yes Was to pt encouraged to call back with questions or concerns? Yes

## 2022-05-07 NOTE — Telephone Encounter (Signed)
PA for Nurtex has been denied, letter states none of the alternative meds have been tried for 4-6 weeks.   Alternatives include: beta blockers, antidepressives and anticonvulsants.   Please advise.

## 2022-05-09 ENCOUNTER — Telehealth: Payer: Self-pay

## 2022-05-09 NOTE — Telephone Encounter (Signed)
Patient has been scheduled at [facility] breast center for mammogram on 06/08/2022 @ 8:20 AM. Patient has verbalized understanding.

## 2022-05-09 NOTE — Telephone Encounter (Signed)
-----   Message from [name], NP sent at 05/01/2022  9:34 AM EDT ----- Can we schedule her mammogram?

## 2022-05-10 ENCOUNTER — Ambulatory Visit: Payer: Self-pay

## 2022-05-10 ENCOUNTER — Ambulatory Visit (INDEPENDENT_AMBULATORY_CARE_PROVIDER_SITE_OTHER): Payer: BC Managed Care – PPO

## 2022-05-10 VITALS — BP 114/68 | HR 103 | Temp 98.1°F | Resp 18 | Ht 60.0 in | Wt 145.6 lb

## 2022-05-10 DIAGNOSIS — K5100 Ulcerative (chronic) pancolitis without complications: Secondary | ICD-10-CM

## 2022-05-10 MED ORDER — VEDOLIZUMAB 300 MG IV SOLR
300.0000 mg | Freq: Once | INTRAVENOUS | Status: AC
  Administered 2022-05-10: 300 mg via INTRAVENOUS
  Filled 2022-05-10: qty 5

## 2022-05-10 NOTE — Progress Notes (Signed)
Diagnosis: Crohn's Disease  Provider:  Marshell Garfinkel MD  Procedure: Infusion  IV Type: Peripheral, IV Location: L Antecubital  Entyvio (Vedolizumab), Dose: 300 mg  Infusion Start Time: 0254  Infusion Stop Time: 8628  Post Infusion IV Care: Peripheral IV Discontinued  Discharge: Condition: Good, Destination: Home . AVS provided to patient.   Performed by:  Koren Shiver, RN

## 2022-05-10 NOTE — Telephone Encounter (Signed)
Message from Scherrie Gerlach sent at 05/10/2022  8:01 AM EDT  Summary: nausea   Pt states she suffers from nausea and has this past year, but not to this extent.  Pt finds it difficult to eat. She want s to know is there anything stronger than  4 mg and/or promethazine?  Neither of these are prescr.ibed by Santiago Glad          Chief Complaint: poor appetite, chronic nausea Symptoms: vomited last time was last week. Weight loss Frequency: chronic Pertinent Negatives: Patient denies pain Disposition: [] ED /[] Urgent Care (no appt availability in office) / [x] Appointment(In office/virtual)/ []  [place] [facility]/ [] Home Care/ [] Refused Recommended Disposition /[] [place] [facility]/ []  Follow-up with PCP Additional Notes: advised pt to also call gastroenterologist  Reason for Disposition  Nausea is a chronic symptom (recurrent or ongoing AND present > 4 weeks)  Answer Assessment - Initial Assessment Questions 1. NAUSEA SEVERITY: "How bad is the nausea?" (e.g., mild, moderate, severe; dehydration, weight loss)   - MILD: loss of appetite without change in eating habits   - MODERATE: decreased oral intake without significant weight loss, dehydration, or malnutrition   - SEVERE: inadequate caloric or fluid intake, significant weight loss, symptoms of dehydration     moderate 2. ONSET: "When did the nausea begin?"     chronic 3. VOMITING: "Any vomiting?" If Yes, ask: "How many times today?"     Last week 4. RECURRENT SYMPTOM: "Have you had nausea before?" If Yes, ask: "When was the last time?" "What happened that time?"     Poor appetite 5. CAUSE: "What do you think is causing the nausea?"    Unsure h/o gastric bypass 6. PREGNANCY: "Is there any chance you are pregnant?" (e.g., unprotected intercourse, missed birth control pill, broken condom)     N/a  Protocols used: Nausea-A-AH

## 2022-05-11 ENCOUNTER — Telehealth (INDEPENDENT_AMBULATORY_CARE_PROVIDER_SITE_OTHER): Payer: BC Managed Care – PPO | Admitting: Nurse Practitioner

## 2022-05-11 ENCOUNTER — Encounter: Payer: Self-pay | Admitting: Nurse Practitioner

## 2022-05-11 DIAGNOSIS — R110 Nausea: Secondary | ICD-10-CM

## 2022-05-11 MED ORDER — PROMETHAZINE HCL 25 MG PO TABS: 25.0000 mg | ORAL_TABLET | Freq: Three times a day (TID) | ORAL | 0 refills | Status: DC | PRN

## 2022-05-11 NOTE — Progress Notes (Signed)
LMP  (LMP Unknown)    Subjective:    Patient ID: Amy Hansen, female    DOB: [birth year], 66 y.o.   MRN: [id number]  HPI: Amy Hansen is a 66 y.o. female  Chief Complaint  Patient presents with  . Nausea    Chronic , patient states she is hardly able to eat anything. Patient reports nausea medication does not help much   . Diabetic Eye Exam    Will request recent eye exam from [facility].    Patient states she has been having a lot of nausea.  She had an endoscopy done two weeks ago.  She had a few ulcerations in her stomach.  Otherwise, he stated that everything looked okay. She didn't talk to the GI doctor about her nausea.  She was prescribed phenergan by the neurologist which she feels like helped better than the zofran for her nausea.  The nausea had worsened with the recent chron's flare.  Her Entivio was changed from every 8 weeks to every 4 weeks.  Patient had gastric by pass about 10 years ago.    Relevant past medical, surgical, family and social history reviewed and updated as indicated. Interim medical history since our last visit reviewed. Allergies and medications reviewed and updated.  Review of Systems  Gastrointestinal:  Positive for nausea.    Per HPI unless specifically indicated above     Objective:    LMP  (LMP Unknown)   Wt Readings from Last 3 Encounters:  05/10/22 145 lb 9.6 oz (66 kg)  05/05/22 143 lb (64.9 kg)  05/01/22 145 lb (65.8 kg)    Physical Exam Vitals and nursing note reviewed.  HENT:     Head: Normocephalic.     Right Ear: Hearing normal.     Left Ear: Hearing normal.     Nose: Nose normal.  Eyes:     Pupils: Pupils are equal, round, and reactive to light.  Pulmonary:     Effort: Pulmonary effort is normal. No respiratory distress.  Neurological:     Mental Status: She is alert.  Psychiatric:        Mood and Affect: Mood normal.        Behavior: Behavior normal.        Thought Content: Thought content normal.         Judgment: Judgment normal.    Results for orders placed or performed during the hospital encounter of 05/06/22  CBC  Result Value Ref Range   WBC 5.5 4.0 - 10.5 K/uL   RBC 4.04 3.87 - 5.11 MIL/uL   Hemoglobin 9.9 (L) 12.0 - 15.0 g/dL   HCT 32.5 (L) 36.0 - 46.0 %   MCV 80.4 80.0 - 100.0 fL   MCH 24.5 (L) 26.0 - 34.0 pg   MCHC 30.5 30.0 - 36.0 g/dL   RDW 15.5 11.5 - 15.5 %   Platelets 333 150 - 400 K/uL   nRBC 0.0 0.0 - 0.2 %  Comprehensive metabolic panel  Result Value Ref Range   Sodium 133 (L) 135 - 145 mmol/L   Potassium 3.7 3.5 - 5.1 mmol/L   Chloride 101 98 - 111 mmol/L   CO2 24 22 - 32 mmol/L   Glucose, Bld 178 (H) 70 - 99 mg/dL   BUN 12 8 - 23 mg/dL   Creatinine, Ser 0.78 0.44 - 1.00 mg/dL   Calcium 8.6 (L) 8.9 - 10.3 mg/dL   Total Protein 6.9 6.5 - 8.1 g/dL  Albumin 3.4 (L) 3.5 - 5.0 g/dL   AST 22 15 - 41 U/L   ALT 19 0 - 44 U/L   Alkaline Phosphatase 106 38 - 126 U/L   Total Bilirubin 0.5 0.3 - 1.2 mg/dL   GFR, Estimated >60 >60 mL/min   Anion gap 8 5 - 15      Assessment & Plan:   Problem List Items Addressed This Visit   None Visit Diagnoses     Nausea    -  Primary   Ongoing. Has worsened since her recent Chron's flare.  Refilled phenergen today. Recommend following up with GI for further evaluation and treatment.        Follow up plan: No follow-ups on file.  This visit was completed via MyChart due to the restrictions of the COVID-19 pandemic. All issues as above were discussed and addressed. Physical exam was done as above through visual confirmation on MyChart. If it was felt that the patient should be evaluated in the office, they were directed there. The patient verbally consented to this visit. Location of the patient: Home Location of the provider: Office Those involved with this call:  Provider: [name], NP CMA: Valinda Hoar, [place] Desk/Registration: Lynnell Catalan This encounter was conducted via video.  I spent 20  dedicated to the care of this patient on the date of this encounter to include previsit review of symptoms, plan of care, and follow  up, face to face time with the patient, and post visit ordering of testing.

## 2022-05-14 NOTE — Telephone Encounter (Signed)
PA initiated for Nurtec via covermymeds. Awaiting determination [id number]

## 2022-05-15 ENCOUNTER — Telehealth: Payer: Self-pay

## 2022-05-15 NOTE — Telephone Encounter (Signed)
PA has been approved for Nurtec from 05/14/2022 through 08/05/2022. Pt has been notified via phone call. No further action needed.

## 2022-05-16 ENCOUNTER — Telehealth: Payer: Self-pay | Admitting: Gastroenterology

## 2022-05-16 NOTE — Telephone Encounter (Signed)
Attempted to reach patient. Her vm is full and not able to accept any new messages at this time.

## 2022-05-16 NOTE — Telephone Encounter (Signed)
Patient called states she is constantly nauseous and medications are not helping. Wants to know if there is some types of injection or medication that can be call in for her. Please call to advise.

## 2022-05-18 NOTE — Telephone Encounter (Signed)
2nd attempt to reach patient - Her vm is still full at this time and not able to accept any new messages.

## 2022-05-21 NOTE — Telephone Encounter (Signed)
No return call received. Will await further communication from patient.

## 2022-05-28 ENCOUNTER — Telehealth: Payer: Self-pay | Admitting: Nurse Practitioner

## 2022-05-28 NOTE — Telephone Encounter (Signed)
Copied from [place] [id number]. Topic: General - Other >> May 25, 2022  9:30 AM Cyndi Bender wrote: Reason for CRM: Pt stated she will be switching insurance to Medicare and she needs to speak with someone about her medications. Pt stated she normally speak with some named Pam that assists with getting her medications at a discount. Pt requests that her call be returned asap because she has to sign up for Medicare by November 5. Cb# [phone]

## 2022-05-29 ENCOUNTER — Telehealth: Payer: Self-pay | Admitting: *Deleted

## 2022-05-29 NOTE — Patient Outreach (Signed)
  Care Coordination   05/29/2022 Name: DELAINIE CHAVANA MRN: [id number] DOB: 11-10-1955   Care Coordination Outreach Attempts:   Notified that member called requesting call back regarding questions about Medicare and medications.  Call was placed, unsuccessful and mailbox was full.    Follow Up Plan:  Additional outreach attempts will be made to offer the patient care coordination information and services.   Encounter Outcome:  No Answer  Care Coordination Interventions Activated:  No   Care Coordination Interventions:  No, not indicated    Valente David, RN, MSN, [facility] Care Management Care Management Coordinator [phone]

## 2022-05-31 NOTE — Progress Notes (Signed)
BP 125/80   Pulse 94   Temp 98.1 F (36.7 C) (Oral)   Wt 145 lb 6.4 oz (66 kg)   LMP  (LMP Unknown)   SpO2 98%   BMI 28.40 kg/m    Subjective:    Patient ID: Amy Hansen, female    DOB: Aug 06, 1955, 66 y.o.   MRN: [id number]  HPI: Amy Hansen is a 66 y.o. female  Chief Complaint  Patient presents with   Diabetes   DIABETES Patient states she was able to pick up the mounjaro.  No side effects with the mounjaro.  Hypoglycemic episodes:no Polydipsia/polyuria: no Visual disturbance: no Chest pain: no Paresthesias: no Glucose Monitoring: no  Accucheck frequency: Not Checking  Fasting glucose:  Post prandial:  Evening:  Before meals: Taking Insulin?: no  Long acting insulin:  Short acting insulin:  Relevant past medical, surgical, family and social history reviewed and updated as indicated. Interim medical history since our last visit reviewed. Allergies and medications reviewed and updated.  Review of Systems  Eyes:  Negative for visual disturbance.  Cardiovascular:  Negative for chest pain.  Endocrine: Negative for polydipsia and polyuria.  Neurological:  Negative for numbness.    Per HPI unless specifically indicated above     Objective:    BP 125/80   Pulse 94   Temp 98.1 F (36.7 C) (Oral)   Wt 145 lb 6.4 oz (66 kg)   LMP  (LMP Unknown)   SpO2 98%   BMI 28.40 kg/m   Wt Readings from Last 3 Encounters:  06/01/22 145 lb 6.4 oz (66 kg)  05/10/22 145 lb 9.6 oz (66 kg)  05/05/22 143 lb (64.9 kg)    Physical Exam Vitals and nursing note reviewed.  Constitutional:      General: She is not in acute distress.    Appearance: Normal appearance. She is normal weight. She is not ill-appearing, toxic-appearing or diaphoretic.  HENT:     Head: Normocephalic.     Right Ear: External ear normal.     Left Ear: External ear normal.     Nose: Nose normal.     Mouth/Throat:     Mouth: Mucous membranes are moist.     Pharynx: Oropharynx is clear.  Eyes:      General:        Right eye: No discharge.        Left eye: No discharge.     Extraocular Movements: Extraocular movements intact.     Conjunctiva/sclera: Conjunctivae normal.     Pupils: Pupils are equal, round, and reactive to light.  Cardiovascular:     Rate and Rhythm: Normal rate and regular rhythm.     Heart sounds: No murmur heard. Pulmonary:     Effort: Pulmonary effort is normal. No respiratory distress.     Breath sounds: Normal breath sounds. No wheezing or rales.  Musculoskeletal:     Cervical back: Normal range of motion and neck supple.  Skin:    General: Skin is warm and dry.     Capillary Refill: Capillary refill takes less than 2 seconds.  Neurological:     General: No focal deficit present.     Mental Status: She is alert and oriented to person, place, and time. Mental status is at baseline.  Psychiatric:        Mood and Affect: Mood normal.        Behavior: Behavior normal.        Thought Content: Thought content  normal.        Judgment: Judgment normal.     Results for orders placed or performed in visit on 05/15/22  HM DIABETES EYE EXAM  Result Value Ref Range   HM Diabetic Eye Exam No Retinopathy No Retinopathy      Assessment & Plan:   Problem List Items Addressed This Visit       Endocrine   Poorly controlled type 2 diabetes mellitus ([place]) - Primary    Chronic. Not well controlled.  Has not been checking sugars.  Mounjaro increased to 14m. Not having any side effects from medication.  Freestyle Libre sent to the pharmacy.  Will start Crestor 541mfor Cardioprotection.  Continue with other medications.  Follow up in 2 months.  Call sooner if concerns arise.       Relevant Medications   tirzepatide (MOUNJARO) 5 MG/0.5ML Pen   Continuous Blood Gluc Receiver (FREESTYLE LIBRE READER) DEVI   Continuous Blood Gluc Sensor (FREESTYLE LIBRE 14 DAY SENSOR) MISC   rosuvastatin (CRESTOR) 5 MG tablet   Other Relevant Orders   Urine Microalbumin w/creat.  ratio   Glucose Hemocue Waived (STAT)     Follow up plan: Return in about 2 months (around 08/01/2022) for HTN, HLD, DM2 FU.

## 2022-06-01 ENCOUNTER — Encounter: Payer: Self-pay | Admitting: Nurse Practitioner

## 2022-06-01 ENCOUNTER — Ambulatory Visit: Payer: BC Managed Care – PPO | Admitting: Nurse Practitioner

## 2022-06-01 VITALS — BP 125/80 | HR 94 | Temp 98.1°F | Wt 145.4 lb

## 2022-06-01 DIAGNOSIS — E1165 Type 2 diabetes mellitus with hyperglycemia: Secondary | ICD-10-CM | POA: Diagnosis not present

## 2022-06-01 LAB — GLUCOSE HEMOCUE WAIVED: Glu Hemocue Waived: 105 mg/dL — ABNORMAL HIGH (ref 70–99)

## 2022-06-01 LAB — MICROALBUMIN, URINE WAIVED
Creatinine, Urine Waived: 50 mg/dL (ref 10–300)
Microalb, Ur Waived: 10 mg/L (ref 0–19)

## 2022-06-01 MED ORDER — FREESTYLE LIBRE READER DEVI: 1.0000 | 0 refills | Status: DC

## 2022-06-01 MED ORDER — FREESTYLE LIBRE 14 DAY SENSOR MISC: 1.0000 | 0 refills | Status: DC

## 2022-06-01 MED ORDER — ROSUVASTATIN CALCIUM 5 MG PO TABS: 5.0000 mg | ORAL_TABLET | Freq: Every day | ORAL | 1 refills | Status: DC

## 2022-06-01 MED ORDER — TIRZEPATIDE 5 MG/0.5ML ~~LOC~~ SOAJ: 5.0000 mg | SUBCUTANEOUS | 1 refills | Status: DC

## 2022-06-01 NOTE — Addendum Note (Signed)
Addended by: [name] on: 06/01/2022 10:54 AM   Modules accepted: Orders

## 2022-06-01 NOTE — Assessment & Plan Note (Signed)
>>  ASSESSMENT AND PLAN FOR DIABETES MELLITUS TYPE 2 IN NONOBESE (HCC) WRITTEN ON 06/01/2022 10:48 AM BY HOLDSWORTH, KAREN, NP  Chronic. Not well controlled.  Has not been checking sugars.  Mounjaro increased to 5mg . Not having any side effects from medication.  Freestyle Libre sent to the pharmacy.  Will start Crestor 5mg  for Cardioprotection.  Continue with other medications.  Follow up in 2 months.  Call sooner if concerns arise.

## 2022-06-01 NOTE — Assessment & Plan Note (Signed)
Chronic. Not well controlled.  Has not been checking sugars.  Mounjaro increased to 62m. Not having any side effects from medication.  Freestyle Libre sent to the pharmacy.  Will start Crestor 534mfor Cardioprotection.  Continue with other medications.  Follow up in 2 months.  Call sooner if concerns arise.

## 2022-06-01 NOTE — Progress Notes (Signed)
Results discussed with patient during visit.

## 2022-06-06 ENCOUNTER — Telehealth: Payer: Self-pay | Admitting: Nurse Practitioner

## 2022-06-06 MED ORDER — PROMETHAZINE HCL 25 MG PO TABS: 25.0000 mg | ORAL_TABLET | Freq: Three times a day (TID) | ORAL | 0 refills | Status: DC | PRN

## 2022-06-06 NOTE — Telephone Encounter (Signed)
Patient has been notified via phone call.

## 2022-06-06 NOTE — Telephone Encounter (Signed)
Patient walked in and would like refill on promethazine (phenergan) 25 mg tablet. She said she has been very nauseous.

## 2022-06-06 NOTE — Telephone Encounter (Signed)
Medication sent to the pharmacy.

## 2022-06-07 ENCOUNTER — Ambulatory Visit (INDEPENDENT_AMBULATORY_CARE_PROVIDER_SITE_OTHER): Payer: BC Managed Care – PPO

## 2022-06-07 VITALS — BP 108/73 | HR 86 | Temp 97.6°F | Resp 20 | Ht 60.0 in | Wt 144.8 lb

## 2022-06-07 DIAGNOSIS — K5100 Ulcerative (chronic) pancolitis without complications: Secondary | ICD-10-CM

## 2022-06-07 MED ORDER — VEDOLIZUMAB 300 MG IV SOLR
300.0000 mg | Freq: Once | INTRAVENOUS | Status: AC
  Administered 2022-06-07: 300 mg via INTRAVENOUS
  Filled 2022-06-07: qty 5

## 2022-06-07 NOTE — Progress Notes (Signed)
Diagnosis: Crohn's Disease  Provider:  Marshell Garfinkel MD  Procedure: Infusion  IV Type: Peripheral, IV Location: L Antecubital  Entyvio (Vedolizumab), Dose: 300 mg  Infusion Start Time: 3414  Infusion Stop Time: 1208  Post Infusion IV Care: Peripheral IV Discontinued  Discharge: Condition: Good, Destination: Home . AVS provided to patient.   Performed by:  Koren Shiver, RN

## 2022-06-08 ENCOUNTER — Ambulatory Visit
Admission: RE | Admit: 2022-06-08 | Discharge: 2022-06-08 | Disposition: A | Discharge status: Home / Self Care | Payer: BC Managed Care – PPO | Source: Ambulatory Visit | Attending: Nurse Practitioner | Admitting: Nurse Practitioner

## 2022-06-08 DIAGNOSIS — Z1231 Encounter for screening mammogram for malignant neoplasm of breast: Secondary | ICD-10-CM | POA: Diagnosis not present

## 2022-06-09 ENCOUNTER — Other Ambulatory Visit: Payer: Self-pay | Admitting: Physician Assistant

## 2022-06-11 NOTE — Progress Notes (Signed)
Please let patient know her Mammogram did not show any evidence of a malignancy.  The recommendation is to repeat the Mammogram in 1 year.  

## 2022-06-11 NOTE — Telephone Encounter (Signed)
Last filled 10/21, due 11/19

## 2022-06-12 ENCOUNTER — Encounter: Payer: Self-pay | Admitting: Physician Assistant

## 2022-06-12 ENCOUNTER — Ambulatory Visit: Payer: BC Managed Care – PPO | Attending: Physician Assistant | Admitting: Physician Assistant

## 2022-06-12 ENCOUNTER — Ambulatory Visit (INDEPENDENT_AMBULATORY_CARE_PROVIDER_SITE_OTHER): Payer: BC Managed Care – PPO

## 2022-06-12 VITALS — BP 120/76 | HR 92 | Ht 60.0 in | Wt 140.4 lb

## 2022-06-12 DIAGNOSIS — I5032 Chronic diastolic (congestive) heart failure: Secondary | ICD-10-CM

## 2022-06-12 DIAGNOSIS — E119 Type 2 diabetes mellitus without complications: Secondary | ICD-10-CM

## 2022-06-12 DIAGNOSIS — R0609 Other forms of dyspnea: Secondary | ICD-10-CM

## 2022-06-12 DIAGNOSIS — I471 Supraventricular tachycardia, unspecified: Secondary | ICD-10-CM

## 2022-06-12 DIAGNOSIS — Z8673 Personal history of transient ischemic attack (TIA), and cerebral infarction without residual deficits: Secondary | ICD-10-CM | POA: Diagnosis not present

## 2022-06-12 DIAGNOSIS — R55 Syncope and collapse: Secondary | ICD-10-CM

## 2022-06-12 MED ORDER — FUROSEMIDE 20 MG PO TABS: 20.0000 mg | ORAL_TABLET | Freq: Every day | ORAL | 3 refills | Status: DC

## 2022-06-12 NOTE — Progress Notes (Signed)
Cardiology Office Note    Date:  06/12/2022   ID:  Amy Hansen, DOB 12-03-1955, MRN [id number]  PCP:  [name], NP  Cardiologist:  Nelva Bush, MD  Electrophysiologist:  None   Chief Complaint: Follow-up  History of Present Illness:   Amy Hansen is a 66 y.o. female with history of HFpEF, atypical chest pain, PSVT, DM2, anemia, diverticulitis, RLS, hiatal hernia, anxiety, depression, and GERD who presents for follow-up of her HFpEF.  She was previously followed by Dr. Yvone Neu with echo in 07/2016 showing an EF of 55 to 60%, no regional wall motion abnormalities, grade 1 diastolic dysfunction, mild mitral regurgitation, and a mildly dilated left atrium.  Treadmill MPI in 08/2016 showed no significant ischemia with adequate exercise tolerance, and was overall low risk.  Zio patch in 07/2019 showed a predominant rhythm of sinus with an average rate of 101 bpm, a single episode of NSVT lasting 9 beats, 20 episodes of SVT lasting up to 11 beats, and rare PACs/PVCs.  Patient triggered events corresponded to sinus rhythm with PACs.  Most recent echo from 03/2021, demonstrated an EF of 60 to 65%, no regional wall motion abnormalities, grade 1 diastolic dysfunction, normal RV systolic function and ventricular cavity size, mild aortic valve sclerosis without evidence of stenosis, and an estimated right atrial pressure of 3 mmHg.  She was last seen in the office in 05/2021 and was without symptoms of angina or decompensation.  Her previously noted lower edema resolved with discontinuation of prednisone and with daily usage of furosemide 20 mg.  Her mobility was limited by knee pain.  She comes in doing reasonably well from a cardiac perspective.  She is without symptoms of angina or decompensation.  She does report a 1 week history of exertional shortness of breath when going up or down a flight of stairs.  No lower extremity swelling, abdominal distention, or orthopnea.  She has not needed any  additional furosemide.    Prior MRI of the brain in 2022 demonstrated small chronic cortical/subcortical infarct within the left parietal lobe as well as a small chronic lacunar infarcts within the bilateral basal ganglia and changes consistent with chronic small vessel ischemia.  She reports an episode of waking up on the floor of her bedroom this past summer after a lamp fell on her.  CT of the head and MRI of the face showed no acute process.  It is unclear if this was associated with a syncopal episode or not.  No cardiac pro or post-drome.  No further episodes since.     Labs independently reviewed: 04/2022 - potassium 3.7, BUN 12, serum creatinine 0.78, albumin 3.4, AST/ALT normal, Hgb 9.9, PLT 333, A1c 8.7, TC 145, TG 159, HDL 49, LDL 69 04/2020 - TSH normal  Past Medical History:  Diagnosis Date   Anemia    Anxiety    Arthritis    Blood transfusion without reported diagnosis    Cataract    CHF (congestive heart failure) (HCC)    Chronic kidney disease    UTI, hematuria in urine   Colitis    Crohn's disease ([place])    Depression    Diabetes ([place])    Diverticulosis    Frequent headaches    Interstitial cystitis    Recurrent UTI    Restless leg syndrome    TIA (transient ischemic attack)    more than year ago per pt 12-26-21   TIA (transient ischemic attack) 02/20/2021   Urinary frequency  Past Surgical History:  Procedure Laterality Date   bariatric bypass  2012   BIOPSY  05/03/2020   Procedure: BIOPSY;  Surgeon: Milus Banister, MD;  Location: [facility] ENDOSCOPY;  Service: Endoscopy;;   CARPAL TUNNEL RELEASE Right 2003   CARPAL TUNNEL RELEASE Right    2008   CHOLECYSTECTOMY  1975   COLONOSCOPY     COLONOSCOPY WITH PROPOFOL N/A 05/03/2020   Procedure: COLONOSCOPY WITH PROPOFOL;  Surgeon: Milus Banister, MD;  Location: [facility] ENDOSCOPY;  Service: Endoscopy;  Laterality: N/A;   CYSTOSCOPY W/ RETROGRADES Bilateral 06/06/2015   Procedure: CYSTOSCOPY WITH RETROGRADE PYELOGRAM;   Surgeon: Festus Aloe, MD;  Location: [facility] ORS;  Service: Urology;  Laterality: Bilateral;   FL INJ LEFT KNEE CT ARTHROGRAM ([facility] HX) Left    1995   GASTRIC BYPASS  2010   HAND SURGERY Left 01/19/2021   Thumb   HEMORRHOID SURGERY  2013   KNEE ARTHROSCOPY Left 1996   TONSILLECTOMY     TOTAL ABDOMINAL HYSTERECTOMY W/ BILATERAL SALPINGOOPHORECTOMY      Current Medications: Current Meds  Medication Sig   ALPRAZolam (XANAX) 1 MG tablet Take 1 tablet (1 mg total) by mouth 3 (three) times daily as needed. for anxiety   buPROPion (WELLBUTRIN XL) 150 MG 24 hr tablet Take 3 tablets (450 mg total) by mouth daily.   Continuous Blood Gluc Receiver (FREESTYLE LIBRE READER) DEVI 1 Dose by Does not apply route every 14 (fourteen) days.   Continuous Blood Gluc Sensor (FREESTYLE LIBRE 14 DAY SENSOR) MISC 1 each by Does not apply route every 14 (fourteen) days.   lamoTRIgine (LAMICTAL) 200 MG tablet Take 2 tablets (400 mg total) by mouth at bedtime.   metFORMIN (GLUCOPHAGE) 1000 MG tablet Take 1 tablet by mouth twice daily.   omeprazole (PRILOSEC) 40 MG capsule Omeprazole 40 mg once daily for 6 weeks   promethazine (PHENERGAN) 25 MG tablet Take 1 tablet (25 mg total) by mouth every 8 (eight) hours as needed for nausea or vomiting.   QUEtiapine (SEROQUEL) 300 MG tablet TAKE 1 TABLET BY MOUTH AT BEDTIME   Rimegepant Sulfate (NURTEC) 75 MG TBDP Take 1 tablet by mouth daily as needed.   rOPINIRole (REQUIP) 1 MG tablet Take 1 tablet (1 mg total) by mouth 2 (two) times daily.   rosuvastatin (CRESTOR) 5 MG tablet Take 1 tablet (5 mg total) by mouth daily.   sertraline (ZOLOFT) 100 MG tablet Take 2 tablets by mouth once daily   tirzepatide (MOUNJARO) 5 MG/0.5ML Pen Inject 5 mg into the skin once a week.   vedolizumab (ENTYVIO) 300 MG injection Inject into the vein every 8 (eight) weeks.   Vitamin D, Ergocalciferol, (DRISDOL) 1.25 MG (50000 UNIT) CAPS capsule Take 1 capsule by mouth once a week    [DISCONTINUED] furosemide (LASIX) 20 MG tablet Take 1 tablet (20 mg total) by mouth daily. May take an additional tablet (75m) AS NEEDED for weight gain and/or swelling.    Allergies:   Avelox [moxifloxacin hcl in nacl], Bactrim [sulfamethoxazole-trimethoprim], Ciprofloxacin, Neomycin-polymyxin-dexameth, Depakote [divalproex sodium], Imitrex [sumatriptan], and Stadol [butorphanol]   Social History   Socioeconomic History   Marital status: Married    Spouse name: Not on file   Number of children: Not on file   Years of education: Not on file   Highest education level: Not on file  Occupational History   Not on file  Tobacco Use   Smoking status: Former    Packs/day: 0.50    Types: Cigarettes  Quit date: 04/25/1975    Years since quitting: 47.1   Smokeless tobacco: Never   Tobacco comments:    quit 40 years  Vaping Use   Vaping Use: Never used  Substance and Sexual Activity   Alcohol use: No    Alcohol/week: 0.0 standard drinks of alcohol   Drug use: No   Sexual activity: Not Currently    Birth control/protection: Post-menopausal, Surgical  Other Topics Concern   Not on file  Social History Narrative   Caffeine 5 servings per day.   Social Determinants of Health   Financial Resource Strain: Medium Risk (04/12/2021)   Overall Financial Resource Strain (CARDIA)    Difficulty of Paying Living Expenses: Somewhat hard  Food Insecurity: No Food Insecurity (03/06/2022)   Hunger Vital Sign    Worried About Running Out of Food in the Last Year: Never true    Ran Out of Food in the Last Year: Never true  Transportation Needs: No Transportation Needs (03/06/2022)   PRAPARE - Hydrologist (Medical): No    Lack of Transportation (Non-Medical): No  Physical Activity: Inactive (04/12/2021)   Exercise Vital Sign    Days of Exercise per Week: 0 days    Minutes of Exercise per Session: 0 min  Stress: No Stress Concern Present (03/06/2022)   [place]    Feeling of Stress : Only a little  Social Connections: Moderately Integrated (03/06/2022)   Social Connection and Isolation Panel [NHANES]    Frequency of Communication with Friends and Family: More than three times a week    Frequency of Social Gatherings with Friends and Family: More than three times a week    Attends Religious Services: More than 4 times per year    Active Member of [employer] or Organizations: No    Attends Archivist Meetings: Never    Marital Status: Married     Family History:  The patient's family history includes Colon cancer in her mother; Heart failure in her sister; Stroke in her father. There is no history of Bladder Cancer, Kidney disease, Prostate cancer, Kidney cancer, Pancreatic cancer, Esophageal cancer, Stomach cancer, Rectal cancer, or Breast cancer.  ROS:   12-point review of systems is negative unless otherwise noted in HPI   EKGs/Labs/Other Studies Reviewed:    Studies reviewed were summarized above. The additional studies were reviewed today:  2D echo 04/20/2021: 1. Left ventricular ejection fraction, by estimation, is 60 to 65%. Left  ventricular ejection fraction by 2D MOD biplane is 64.3 %. The left  ventricle has normal function. The left ventricle has no regional wall  motion abnormalities. Left ventricular  diastolic parameters are consistent with Grade I diastolic dysfunction  (impaired relaxation).   2. Right ventricular systolic function is normal. The right ventricular  size is normal.   3. The mitral valve is grossly normal. No evidence of mitral valve  regurgitation.   4. The aortic valve is tricuspid. Aortic valve regurgitation is not  visualized. Mild aortic valve sclerosis is present, with no evidence of  aortic valve stenosis.   5. The inferior vena cava is normal in size with greater than 50%  respiratory variability, suggesting right atrial pressure of 3  mmHg.   Comparison(s): LVEF 55-60%.  __________  Elwyn Reach patch 07/2019: The patient was monitored for 12 days, 14 hours. The predominant rhythm was sinus with an average rate of 101 bpm (range 66 to 150 bpm  in sinus). Rare PACs and PVCs were noted. A single episode of nonsustained ventricular tachycardia lasting nine beats occurred, with a maximum rate of 152 bpm. There were 20 episodes of supraventricular tachycardia lasting up to 11 beats with a maximal rate of 176 bpm. No sustained arrhythmia or prolonged pause was observed. Patient triggered events correspond to sinus rhythm with PACs.   Predominantly sinus rhythm with rare PACs and PVCs as well as brief PSVT and NSVT.  Patient triggered events correspond to sinus rhythm with PACs. __________  Treadmill MPI 08/30/2016: Exercise  myocardial perfusion imaging study with no significant  ischemia Normal wall motion, EF estimated at 50% No EKG changes concerning for ischemia at peak stress or in recovery. Target heart rate achieved Adequate exercise tolerance, exercised for 5:50 min Low risk scan __________  2D echo 08/23/2016: - Left ventricle: The cavity size was normal. Wall thickness was    normal. Systolic function was normal. The estimated ejection    fraction was in the range of 55% to 60%. Wall motion was normal;    there were no regional wall motion abnormalities. Doppler    parameters are consistent with abnormal left ventricular    relaxation (grade 1 diastolic dysfunction).  - Mitral valve: There was mild regurgitation.  - Left atrium: The atrium was mildly dilated.    EKG:  EKG is ordered today.  The EKG ordered today demonstrates NSR, 92 bpm, no acute ST-T changes  Recent Labs: 05/05/2022: ALT 19; BUN 12; Creatinine, Ser 0.78; Hemoglobin 9.9; Platelets 333; Potassium 3.7; Sodium 133  Recent Lipid Panel    Component Value Date/Time   CHOL 145 05/01/2022 0943   CHOL 139 07/17/2016 1408   CHOL 148 01/10/2012 0717    TRIG 159 (H) 05/01/2022 0943   TRIG 99 07/17/2016 1408   TRIG 110 01/10/2012 0717   HDL 49 05/01/2022 0943   HDL 47 01/10/2012 0717   CHOLHDL 3.0 05/01/2022 0943   CHOLHDL 2.0 04/26/2020 0339   VLDL 13 04/26/2020 0339   VLDL 20 07/17/2016 1408   VLDL 22 01/10/2012 0717   LDLCALC 69 05/01/2022 0943   LDLCALC 79 01/10/2012 0717    PHYSICAL EXAM:    VS:  BP 120/76 (BP Location: Left Arm, Patient Position: Sitting, Cuff Size: Normal)   Pulse 92   Ht 5' (1.524 m)   Wt 140 lb 6.4 oz (63.7 kg)   LMP  (LMP Unknown)   SpO2 97%   BMI 27.42 kg/m   BMI: Body mass index is 27.42 kg/m.  Physical Exam Vitals reviewed.  Constitutional:      Appearance: She is well-developed.  HENT:     Head: Normocephalic and atraumatic.  Eyes:     General:        Right eye: No discharge.        Left eye: No discharge.  Neck:     Vascular: No JVD.  Cardiovascular:     Rate and Rhythm: Normal rate and regular rhythm.     Pulses:          Posterior tibial pulses are 2+ on the right side and 2+ on the left side.     Heart sounds: Normal heart sounds, S1 normal and S2 normal. Heart sounds not distant. No midsystolic click and no opening snap. No murmur heard.    No friction rub.  Pulmonary:     Effort: Pulmonary effort is normal. No respiratory distress.     Breath sounds: Normal breath sounds. No  decreased breath sounds, wheezing or rales.  Chest:     Chest wall: No tenderness.  Abdominal:     General: There is no distension.  Musculoskeletal:     Cervical back: Normal range of motion.     Right lower leg: No edema.     Left lower leg: No edema.  Skin:    General: Skin is warm and dry.     Nails: There is no clubbing.  Neurological:     Mental Status: She is alert and oriented to person, place, and time.  Psychiatric:        Speech: Speech normal.        Behavior: Behavior normal.        Thought Content: Thought content normal.        Judgment: Judgment normal.     Wt Readings from  Last 3 Encounters:  06/12/22 140 lb 6.4 oz (63.7 kg)  06/07/22 144 lb 12.8 oz (65.7 kg)  06/01/22 145 lb 6.4 oz (66 kg)     ASSESSMENT & PLAN:   HFpEF: Appears euvolemic and well compensated on low-dose furosemide 20 mg daily with an additional 20 mg as needed, which she has not needed since she was last seen.  Weight stable when compared to her visit in our office in 05/2021.  With a 1 week history of exertional dyspnea, we will obtain an echo.  If results are reassuring, would consider noninvasive ischemic imaging with coronary CTA.  Continued optimal blood pressure control is recommended.  PSVT: Quiescent.  Not currently requiring AV nodal blocking medication.  History of strokes/near syncope: Followed by neurology.  Obtain echo and ZIO AT.  DM2: Would benefit from addition of SGLT2 inhibitor given concomitant DM2 and HFpEF.  Defer to PCP.   Disposition: F/u with Dr. Saunders Revel or an APP in 2 months.   Medication Adjustments/Labs and Tests Ordered: Current medicines are reviewed at length with the patient today.  Concerns regarding medicines are outlined above. Medication changes, Labs and Tests ordered today are summarized above and listed in the Patient Instructions accessible in Encounters.   Signed, Christell Faith, PA-C 06/12/2022 3:32 PM     [place] [address] [place], [place] [address] [phone]

## 2022-06-12 NOTE — Patient Instructions (Signed)
Medication Instructions:  - Your physician recommends that you continue on your current medications as directed. Please refer to the Current Medication list given to you today.  *If you need a refill on your cardiac medications before your next appointment, please call your pharmacy*   Lab Work: - none ordered  If you have labs (blood work) drawn today and your tests are completely normal, you will receive your results only by: [place] (if you have MyChart) OR A paper copy in the mail If you have any lab test that is abnormal or we need to change your treatment, we will call you to review the results.   Testing/Procedures:  1) Echocardiogram: - Your physician has requested that you have an echocardiogram. Echocardiography is a painless test that uses sound waves to create images of your heart. It provides your doctor with information about the size and shape of your heart and how well your heart's chambers and valves are working. This procedure takes approximately one hour. There are no restrictions for this procedure. There is a possibility that an IV may need to be started during your test to inject an image enhancing agent. This is done to obtain more optimal pictures of your heart. Therefore we ask that you do at least drink some water prior to coming in to hydrate your veins.   Please do NOT wear cologne, perfume, aftershave, or lotions (deodorant is allowed). Please arrive 15 minutes prior to your appointment time.    2) Heart Monitor:  Length of Wear: 14 days  Your monitor will be mailed to your home address within 3-5 business days. However, if you have not received your monitor after 5 business days please send [name] a MyChart message or call the office at [phone], so we may follow up on this for you.   Your physician has recommended that you wear a Zio AT (heart) monitor.   This monitor is a medical device that records the heart's electrical activity. Doctors  most often use these monitors to diagnose arrhythmias. Arrhythmias are problems with the speed or rhythm of the heartbeat. The monitor is a small device applied to your chest. You can wear one while you do your normal daily activities. While wearing this monitor if you have any symptoms to push the button and record what you felt. Once you have worn this monitor for the period of time provider prescribed (Usually 14 days), you will return the monitor device in the postage paid box. Once it is returned they will download the data collected and provide [name] with a report which the provider will then review and we will call you with those results. Important tips:  Avoid showering during the first 24 hours of wearing the monitor. Avoid excessive sweating to help maximize wear time. Do not submerge the device, no hot tubs, and no swimming pools. Keep any lotions or oils away from the patch. After 24 hours you may shower with the patch on. Take brief showers with your back facing the shower head.  Do not remove patch once it has been placed because that will interrupt data and decrease adhesive wear time. Push the button when you have any symptoms and write down what you were feeling. Once you have completed wearing your monitor, remove and place into box which has postage paid and place in your outgoing mailbox.  If for some reason you have misplaced your box then call our office and we can provide another box and/or mail  it off for you.     Follow-Up: At [facility], you and your health needs are our priority.  As part of our continuing mission to provide you with exceptional heart care, we have created designated Provider Care Teams.  These Care Teams include your primary Cardiologist (physician) and Advanced Practice Providers (APPs -  Physician Assistants and Nurse Practitioners) who all work together to provide you with the care you need, when you need it.  We recommend signing up for the  patient portal called "MyChart".  Sign up information is provided on this After Visit Summary.  MyChart is used to connect with patients for Virtual Visits (Telemedicine).  Patients are able to view lab/test results, encounter notes, upcoming appointments, etc.  Non-urgent messages can be sent to your provider as well.   To learn more about what you can do with MyChart, go to [email or web address].    Your next appointment:   2 month(s)  The format for your next appointment:   In Person  Provider:   You may see Nelva Bush, MD or one of the following Advanced Practice Providers on your designated Care Team:    Christell Faith, PA-C   Other Instructions  Echocardiogram An echocardiogram is a test that uses sound waves (ultrasound) to produce images of the heart. Images from an echocardiogram can provide important information about: Heart size and shape. The size and thickness and movement of your heart's walls. Heart muscle function and strength. Heart valve function or if you have stenosis. Stenosis is when the heart valves are too narrow. If blood is flowing backward through the heart valves (regurgitation). A tumor or infectious growth around the heart valves. Areas of heart muscle that are not working well because of poor blood flow or injury from a heart attack. Aneurysm detection. An aneurysm is a weak or damaged part of an artery wall. The wall bulges out from the normal force of blood pumping through the body. Tell a health care provider about: Any allergies you have. All medicines you are taking, including vitamins, herbs, eye drops, creams, and over-the-counter medicines. Any blood disorders you have. Any surgeries you have had. Any medical conditions you have. Whether you are pregnant or may be pregnant. What are the risks? Generally, this is a safe test. However, problems may occur, including an allergic reaction to dye (contrast) that may be used during the test. What  happens before the test? No specific preparation is needed. You may eat and drink normally. What happens during the test?  You will take off your clothes from the waist up and put on a hospital gown. Electrodes or electrocardiogram (ECG)patches may be placed on your chest. The electrodes or patches are then connected to a device that monitors your heart rate and rhythm. You will lie down on a table for an ultrasound exam. A gel will be applied to your chest to help sound waves pass through your skin. A handheld device, called a transducer, will be pressed against your chest and moved over your heart. The transducer produces sound waves that travel to your heart and bounce back (or "echo" back) to the transducer. These sound waves will be captured in real-time and changed into images of your heart that can be viewed on a video monitor. The images will be recorded on a computer and reviewed by your health care provider. You may be asked to change positions or hold your breath for a short time. This makes it easier to get  different views or better views of your heart. In some cases, you may receive contrast through an IV in one of your veins. This can improve the quality of the pictures from your heart. The procedure may vary among health care providers and hospitals. What can I expect after the test? You may return to your normal, everyday life, including diet, activities, and medicines, unless your health care provider tells you not to do that. Follow these instructions at home: It is up to you to get the results of your test. Ask your health care provider, or the department that is doing the test, when your results will be ready. Keep all follow-up visits. This is important. Summary An echocardiogram is a test that uses sound waves (ultrasound) to produce images of the heart. Images from an echocardiogram can provide important information about the size and shape of your heart, heart muscle  function, heart valve function, and other possible heart problems. You do not need to do anything to prepare before this test. You may eat and drink normally. After the echocardiogram is completed, you may return to your normal, everyday life, unless your health care provider tells you not to do that. This information is not intended to replace advice given to you by your health care provider. Make sure you discuss any questions you have with your health care provider. Document Revised: 03/29/2021 Document Reviewed: 03/08/2020 Elsevier Patient Education  [place]

## 2022-06-15 DIAGNOSIS — Z8673 Personal history of transient ischemic attack (TIA), and cerebral infarction without residual deficits: Secondary | ICD-10-CM | POA: Diagnosis not present

## 2022-06-15 DIAGNOSIS — R55 Syncope and collapse: Secondary | ICD-10-CM | POA: Diagnosis not present

## 2022-06-16 DIAGNOSIS — Z8673 Personal history of transient ischemic attack (TIA), and cerebral infarction without residual deficits: Secondary | ICD-10-CM | POA: Diagnosis not present

## 2022-06-16 DIAGNOSIS — R55 Syncope and collapse: Secondary | ICD-10-CM | POA: Diagnosis not present

## 2022-06-19 ENCOUNTER — Telehealth: Payer: Self-pay | Admitting: Nurse Practitioner

## 2022-06-19 NOTE — Telephone Encounter (Signed)
Copied from [place] [id number]. Topic: General - Inquiry >> Jun 19, 2022 12:49 PM Rosanne Ashing P wrote: Reason for CRM: Pt called saying she still has the nausea and vomiting .  She is vomiting about everythng.  She is out of the promethazine but says it dont work that well and is wondering if there is something else she can take  She uses Paediatric nurse  [address]  [phone]  [phone]

## 2022-06-20 ENCOUNTER — Encounter: Payer: Self-pay | Admitting: Physician Assistant

## 2022-06-20 ENCOUNTER — Ambulatory Visit (INDEPENDENT_AMBULATORY_CARE_PROVIDER_SITE_OTHER): Payer: BC Managed Care – PPO | Admitting: Physician Assistant

## 2022-06-20 VITALS — BP 113/78 | HR 101 | Temp 97.6°F | Wt 139.6 lb

## 2022-06-20 DIAGNOSIS — R110 Nausea: Secondary | ICD-10-CM | POA: Diagnosis not present

## 2022-06-20 MED ORDER — PROMETHAZINE HCL 25 MG PO TABS: 25.0000 mg | ORAL_TABLET | Freq: Three times a day (TID) | ORAL | 0 refills | Status: DC | PRN

## 2022-06-20 NOTE — Progress Notes (Signed)
Acute Office Visit   Patient: Amy Hansen   DOB: 19-Apr-1956   66 y.o. Female  MRN: [id number] Visit Date: 06/20/2022  Today's healthcare provider: Dani Gobble Jefferie Holston, PA-C  Introduced myself to the patient as a Journalist, newspaper and provided education on APPs in clinical practice.    Chief Complaint  Patient presents with   Nausea    Patient states she has been nauseated daily for 2 weeks, has been taking promethazine. Patient believes she is dehydrated.    Subjective    HPI HPI     Nausea    Additional comments: Patient states she has been nauseated daily for 2 weeks, has been taking promethazine. Patient believes she is dehydrated.       Last edited by Louanna Raw, [place] on 06/20/2022 10:07 AM.        Nausea   Reports she is out of her nausea medication Reports the Phenergan sometimes helps but sometimes doesn't  She reports she has tried Zofran in the past without much relief  States she knows she is dehydrated  Reports she is vomiting - about 3 times per week - denies bloody emesis or coffee ground appearance   She reports she is able to drink- only drinks tea or diet soda  She reports has reduced appetite and is only able to eat a few bites of her food - states this has been ongoing for some time   She reports she has been under more stress lately - she is unable to reduce this right now as she is living with family   She has not discussed her nausea with GI yet  She reports her bowel movements are typically more liquid in character  She has not noticed an increase in nausea with Mounjaro    Medications: Outpatient Medications Prior to Visit  Medication Sig   ALPRAZolam (XANAX) 1 MG tablet Take 1 tablet (1 mg total) by mouth 3 (three) times daily as needed. for anxiety   buPROPion (WELLBUTRIN XL) 150 MG 24 hr tablet Take 3 tablets (450 mg total) by mouth daily.   Continuous Blood Gluc Receiver (FREESTYLE LIBRE READER) DEVI 1 Dose by Does not apply route every 14  (fourteen) days.   Continuous Blood Gluc Sensor (FREESTYLE LIBRE 14 DAY SENSOR) MISC 1 each by Does not apply route every 14 (fourteen) days.   furosemide (LASIX) 20 MG tablet Take 1 tablet (20 mg total) by mouth daily. May take an additional tablet (56m) AS NEEDED for weight gain and/or swelling.   lamoTRIgine (LAMICTAL) 200 MG tablet Take 2 tablets (400 mg total) by mouth at bedtime.   metFORMIN (GLUCOPHAGE) 1000 MG tablet Take 1 tablet by mouth twice daily.   omeprazole (PRILOSEC) 40 MG capsule Omeprazole 40 mg once daily for 6 weeks   QUEtiapine (SEROQUEL) 300 MG tablet TAKE 1 TABLET BY MOUTH AT BEDTIME   Rimegepant Sulfate (NURTEC) 75 MG TBDP Take 1 tablet by mouth daily as needed.   rOPINIRole (REQUIP) 1 MG tablet Take 1 tablet (1 mg total) by mouth 2 (two) times daily.   rosuvastatin (CRESTOR) 5 MG tablet Take 1 tablet (5 mg total) by mouth daily.   sertraline (ZOLOFT) 100 MG tablet Take 2 tablets by mouth once daily   tirzepatide (MOUNJARO) 5 MG/0.5ML Pen Inject 5 mg into the skin once a week.   vedolizumab (ENTYVIO) 300 MG injection Inject into the vein every 8 (eight) weeks.   Vitamin D,  Ergocalciferol, (DRISDOL) 1.25 MG (50000 UNIT) CAPS capsule Take 1 capsule by mouth once a week   [DISCONTINUED] promethazine (PHENERGAN) 25 MG tablet Take 1 tablet (25 mg total) by mouth every 8 (eight) hours as needed for nausea or vomiting.   No facility-administered medications prior to visit.    Review of Systems  Constitutional:  Negative for chills, fatigue and fever.  Gastrointestinal:  Positive for abdominal pain, diarrhea, nausea and vomiting. Negative for anal bleeding, blood in stool and constipation.       Objective    BP 113/78   Pulse (!) 101   Temp 97.6 F (36.4 C)   Wt 139 lb 9.6 oz (63.3 kg)   LMP  (LMP Unknown)   SpO2 98%   BMI 27.26 kg/m    Physical Exam Constitutional:      General: She is awake.     Appearance: Normal appearance. She is well-developed and  well-groomed.  Cardiovascular:     Rate and Rhythm: Regular rhythm. Tachycardia present.     Pulses:          Radial pulses are 2+ on the right side and 2+ on the left side.     Heart sounds: Normal heart sounds. No murmur heard.    No friction rub. No gallop.  Pulmonary:     Effort: Pulmonary effort is normal.     Breath sounds: Normal breath sounds. No decreased air movement. No decreased breath sounds, wheezing, rhonchi or rales.  Abdominal:     General: Abdomen is flat. Bowel sounds are normal.     Palpations: Abdomen is soft.     Tenderness: There is generalized abdominal tenderness. There is no right CVA tenderness, left CVA tenderness or guarding.  Neurological:     General: No focal deficit present.     Mental Status: She is alert and oriented to person, place, and time.  Psychiatric:        Attention and Perception: Attention and perception normal.        Mood and Affect: Mood and affect normal.        Speech: Speech normal.        Behavior: Behavior normal. Behavior is cooperative.       No results found for any visits on 06/20/22.  Assessment & Plan      No follow-ups on file.       Problem List Items Addressed This Visit       Other   Nausea - Primary    Unsure of chronicity but ongoing Suspect this is likely multifactorial from GERD, Pancolitis, stress, and dehydration May also have some contributing factors from recent addition of Mounjaro for DM management  Recommend she increase efforts to stay well hydrated with at least 75 oz of water per day along with pedialyte or other preferred electrolyte replacement and incorporate small portioned, bland diet until symptoms are improving  Will send in refill for Phenergan today  Recommend she reach out to GI to discuss ongoing nausea concerns given her extensive GI hx  Follow up as needed for persistent or progressing symptoms       Relevant Medications   promethazine (PHENERGAN) 25 MG tablet     No  follow-ups on file.   I, Traci Plemons E Quandra Fedorchak, PA-C, have reviewed all documentation for this visit. The documentation on 06/20/22 for the exam, diagnosis, procedures, and orders are all accurate and complete.   Talitha Givens, MHS, PA-C [facility]

## 2022-06-20 NOTE — Patient Instructions (Addendum)
To help with your nausea I recommend the following:  Try eating small portions throughout the day (applesauce, boiled chicken, toast, rice, bananas, etc)  Do this for about 2 weeks or until you are feeling better - once you are feeling better you can start slowly reintroducing your normal diet  It is imperative that you drink water - at least 75 oz per day or more, especially while you are dehydrated. You can add a Pedialyte per day to help with electrolyte    Please reach out to your GI provider to discuss your ongoing nausea so they can help [name] rule out any underlying issues.

## 2022-06-20 NOTE — Assessment & Plan Note (Addendum)
Unsure of chronicity but ongoing Suspect this is likely multifactorial from GERD, Pancolitis, stress, and dehydration May also have some contributing factors from recent addition of Mounjaro for DM management  Recommend she increase efforts to stay well hydrated with at least 75 oz of water per day along with pedialyte or other preferred electrolyte replacement and incorporate small portioned, bland diet until symptoms are improving  Will send in refill for Phenergan today  Recommend she reach out to GI to discuss ongoing nausea concerns given her extensive GI hx  Follow up as needed for persistent or progressing symptoms

## 2022-06-25 ENCOUNTER — Telehealth: Payer: Self-pay

## 2022-06-25 NOTE — Telephone Encounter (Signed)
-----   Message from Roetta Sessions, [place] sent at 05/06/2022  9:30 PM EDT ----- Regarding: due for labs Cbc and tibc/ferretin due around Nov 29th

## 2022-06-25 NOTE — Telephone Encounter (Signed)
MyChart message sent to patient to go to the lab

## 2022-07-02 ENCOUNTER — Other Ambulatory Visit: Payer: Self-pay | Admitting: Physician Assistant

## 2022-07-03 ENCOUNTER — Telehealth: Payer: Self-pay | Admitting: Gastroenterology

## 2022-07-03 DIAGNOSIS — R110 Nausea: Secondary | ICD-10-CM

## 2022-07-03 NOTE — Telephone Encounter (Signed)
Returned call to patient. She states that she has been having nausea for several months. Zofran did not work and her PCP prescribed Phenergan and advised her to follow up with [name]. Pt would like refill sent to [facility] on file. Pt also states that she has been having what she feels like are stomach spasms. She described as a "nervous stomach" and feels like her muscles are in a ball. Pt is not sure what is going on. Pt has not been seen in the office since 10/2021. I scheduled her for a follow up on Thursday, 07/05/22 at 3:40 pm. Please advise, thanks.

## 2022-07-03 NOTE — Telephone Encounter (Signed)
Inbound call from patient stating that she believes that she is having stomach spasms. Patient is requesting a call back to discuss. Please advise.

## 2022-07-03 NOTE — Telephone Encounter (Signed)
Okay, sorry to hear this.  You can refill her Phenergan 12.5 mg every 8 hours as needed for nausea.  She may also want to try some FD guard over-the-counter to see if that will help until we can see her in the office in a few days.  Thanks

## 2022-07-04 MED ORDER — PROMETHAZINE HCL 25 MG PO TABS: 25.0000 mg | ORAL_TABLET | Freq: Three times a day (TID) | ORAL | 0 refills | Status: DC | PRN

## 2022-07-04 NOTE — Telephone Encounter (Signed)
Called and spoke with patient. She is aware that we have refilled the Phenergan and she has been advised to get FD Donald Prose OTC and take as directed on the box. Pt is aware that we will see her for her appt tomorrow. Pt verbalized understanding and had no concerns at the end of the call.

## 2022-07-05 ENCOUNTER — Other Ambulatory Visit (INDEPENDENT_AMBULATORY_CARE_PROVIDER_SITE_OTHER): Payer: BC Managed Care – PPO

## 2022-07-05 ENCOUNTER — Encounter: Payer: Self-pay | Admitting: Gastroenterology

## 2022-07-05 ENCOUNTER — Ambulatory Visit (INDEPENDENT_AMBULATORY_CARE_PROVIDER_SITE_OTHER): Payer: BC Managed Care – PPO

## 2022-07-05 ENCOUNTER — Ambulatory Visit: Payer: BC Managed Care – PPO | Admitting: Gastroenterology

## 2022-07-05 VITALS — BP 100/62 | HR 101 | Ht 60.0 in | Wt 137.0 lb

## 2022-07-05 VITALS — BP 116/78 | HR 98 | Temp 97.9°F | Resp 18 | Ht 60.0 in | Wt 137.6 lb

## 2022-07-05 DIAGNOSIS — R634 Abnormal weight loss: Secondary | ICD-10-CM

## 2022-07-05 DIAGNOSIS — R112 Nausea with vomiting, unspecified: Secondary | ICD-10-CM

## 2022-07-05 DIAGNOSIS — K289 Gastrojejunal ulcer, unspecified as acute or chronic, without hemorrhage or perforation: Secondary | ICD-10-CM | POA: Diagnosis not present

## 2022-07-05 DIAGNOSIS — D509 Iron deficiency anemia, unspecified: Secondary | ICD-10-CM

## 2022-07-05 DIAGNOSIS — K523 Indeterminate colitis: Secondary | ICD-10-CM

## 2022-07-05 DIAGNOSIS — K5100 Ulcerative (chronic) pancolitis without complications: Secondary | ICD-10-CM

## 2022-07-05 LAB — CBC WITH DIFFERENTIAL/PLATELET
Basophils Absolute: 0 10*3/uL (ref 0.0–0.1)
Basophils Relative: 0.3 % (ref 0.0–3.0)
Eosinophils Absolute: 0 10*3/uL (ref 0.0–0.7)
Eosinophils Relative: 0.7 % (ref 0.0–5.0)
HCT: 33.1 % — ABNORMAL LOW (ref 36.0–46.0)
Hemoglobin: 10.6 g/dL — ABNORMAL LOW (ref 12.0–15.0)
Lymphocytes Relative: 39.4 % (ref 12.0–46.0)
Lymphs Abs: 1.8 10*3/uL (ref 0.7–4.0)
MCHC: 31.9 g/dL (ref 30.0–36.0)
MCV: 78.6 fl (ref 78.0–100.0)
Monocytes Absolute: 0.3 10*3/uL (ref 0.1–1.0)
Monocytes Relative: 7.1 % (ref 3.0–12.0)
Neutro Abs: 2.3 10*3/uL (ref 1.4–7.7)
Neutrophils Relative %: 52.5 % (ref 43.0–77.0)
Platelets: 392 10*3/uL (ref 150.0–400.0)
RBC: 4.21 Mil/uL (ref 3.87–5.11)
RDW: 17.8 % — ABNORMAL HIGH (ref 11.5–15.5)
WBC: 4.4 10*3/uL (ref 4.0–10.5)

## 2022-07-05 LAB — IBC + FERRITIN
Ferritin: 5.7 ng/mL — ABNORMAL LOW (ref 10.0–291.0)
Iron: 27 ug/dL — ABNORMAL LOW (ref 42–145)
Saturation Ratios: 6.2 % — ABNORMAL LOW (ref 20.0–50.0)
TIBC: 432.6 ug/dL (ref 250.0–450.0)
Transferrin: 309 mg/dL (ref 212.0–360.0)

## 2022-07-05 MED ORDER — ONDANSETRON 8 MG PO TBDP: 8.0000 mg | ORAL_TABLET | Freq: Three times a day (TID) | ORAL | 1 refills | Status: DC | PRN

## 2022-07-05 MED ORDER — VEDOLIZUMAB 300 MG IV SOLR
300.0000 mg | Freq: Once | INTRAVENOUS | Status: AC
  Administered 2022-07-05: 300 mg via INTRAVENOUS
  Filled 2022-07-05: qty 5

## 2022-07-05 NOTE — Progress Notes (Signed)
HPI :  66 year old female here for a follow-up visit for colitis, nausea.   Colitis History: Admitted October 2021 for iron deficiency anemia and diarrhea/colitis.  She had an infectious work-up performed which was negative.  CT scan showed pancolitis at the time.  She underwent a colonoscopy with Dr. Ardis Hughs showing focal active nonspecific ileitis (on biopsies only - endoscopically ileum was normal), patchy nonspecific colitis in the right and left colon on biopsies, pancolonic inflammation grossly.  She was started on prednisone and then transitioned eventually to Lialda maintenance therapy. Colonoscopy with me on March 2022 for surveillance, showed a normal ileum, but with pancolitis that is active, worse in the left colon.  Biopsies showed chronic active colitis throughout.  She was hospitalized 7/13 through 02/10/2021 with acute exacerbation of diarrhea.  There was suspicion for C. difficile and she had been started on empiric vancomycin just prior to admission as well as prednisone.  GI path panel was negative and C. difficile quick screen also negative.  She did complete a course of vancomycin. Eventually transitioned to [facility] once she got health care insurance. Has responded well to it, had some mild active disease on surveillance colonoscopy 2023 with mildly low Entyvio levels, dosing increased to every 4 weeks.   Since the last visit: Patient had a surveillance colonoscopy in June while on Entyvio every 8-week dosing, this showed generally good control of disease with very mild active inflammation.  Levels were checked and were subtherapeutic (7.7), without antibodies, dosing was subsequently increased to every 4 weeks.  She has been on that dosing since then and states that she has been feeling well in regards to control of her colitis.  She has some occasional diarrhea at times but feels that her colitis is generally in good control.  Recall she was found to have significant iron deficiency  anemia over the summer as well, given her colitis seems generally improved, we proceeded with an EGD to clear her upper tract.  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  She had an EGD with me on October 4, she had 2 small marginal ulcers at the surgical anastomosis.  Biopsies negative for H. pylori.  She was started on omeprazole 40 mg, instructed to correct capsule open and take oral iron.  I have not seen her since that time.  She had last CBC on March 7 showing hemoglobin of 9.9 with MCV of 80.  This hemoglobin is decreased from prior baseline of 11-12's.  She reports her main complaint today has been nausea.  She states she constantly feels some level of nausea and has been vomiting perhaps once daily over the past few months.  Symptoms are getting worse over time.  She has a very poor appetite.  Because she is not eating well she is lost about 10 pounds over the past several months.  She has occasional heartburn but generally well-controlled with omeprazole.  She has not been cracking capsules opened as previously recommended.  She is also not been taking any iron tablets.  She does have occasional upper abdominal discomfort, feels anxious and has spasms in her abdomen.  Her bowels are generally okay, occasionally has diarrhea but no blood in her stools.  She is not using any NSAIDs.  On review of her medication list she has been on Mounjaro.  She states she was on this for the past 4 weeks or so, prior to that she was on Trulicity for the past year or so.  She initially used Zofran which she states helped somewhat, then transition to Phenergan which she states helped somewhat as well.  We discussed her options.  She is not using any NSAIDs.   Prior work-up  Colonoscopy 05/03/2020: - Mild to moderate inflammation from anus to cecum with a normal appearing terminal ileum. Multiple biopsies taken.   A. SMALL BOWEL, TERMINAL ILEUM, BIOPSY:  - Focally active nonspecific ileitis  -  Negative for features of chronicity or granulomas   B. COLON, RIGHT, BIOPSY:  - Patchy active nonspecific colitis with focal ulceration  - Focal microscopic aggregate of epithelioid histiocytes suggestive of a  microadenoma  - See comment   C. COLON, LEFT, BIOPSY:  - Patchy mildly active nonspecific colitis  - Negative for features of chronicity or granulomas  COMMENT:   A, B and C.   Taken together, the ileocolonic biopsies show patchy  active colitis with microgranulomas within the right colon.  Diagnostic  histologic features of idiopathic inflammatory bowel disease, including  lamina propria and basal lymphoplasmacytosis or crypt architectural  distortion are not present.  Differential diagnosis can include  infection, drug effect and evolving/early inflammatory bowel disease  (e.g. Crohn's disease).  AFB and GMS special stains are negative for  acid-fast bacilli and fungal organisms respectively.  Clinical-radiologic correlation and patient follow-up is suggested.      CTAP with contrast 04/21/2020: 1. Diffuse circumferential wall thickening of virtually all of the colon, most notably at the level of the cecum and ascending colon, consistent with infectious or inflammatory colitis. 2. There is a punctate focus of gas within the urinary bladder. Correlate for history of recent instrumentation.  Aortic Atherosclerosis      Colonoscopy 10/06/20 - The perianal and digital rectal examinations were normal. - The terminal ileum appeared normal. - Patchy inflammation characterized by erosions, granularity, mucus and aphthous ulcerations was found in the entire colon, most prominent in the left colon. Biopsies were taken with a cold forceps for histology. - Scattered medium-mouthed diverticula were found in the entire colon. - The exam was otherwise without abnormality.   1. Surgical [P], random right sided sites - [place], DYSPLASIA OR MALIGNANCY  IDENTIFIED 2. Surgical [P], colon, transverse - CHRONIC ACTIVE COLITIS - NO GRANULOMATA, DYSPLASIA OR MALIGNANCY IDENTIFIED 3. Surgical [P], random left sided sites - CHRONIC ACTIVE COLITIS - NO GRANULOMATA, DYSPLASIA OR MALIGNANCY IDENTIFIED      Colonoscopy 12/26/21: - The perianal and digital rectal examinations were normal. - The terminal ileum appeared normal. - Multiple small-mouthed diverticula were found in the entire colon. - Localized mild inflammation characterized by erosions and erythema was found in the transverse colon in one very focal area. Biopsies were taken with a cold forceps for histology. - A 4 mm polyp was found in the distal rectum. The polyp was sessile. The polyp was removed with a cold biopsy forceps. Resection and retrieval were complete. - The exam was otherwise without abnormality in regards to inflammatory burden. No overt inflammation seen elsewhere. There was significant residual stool. The prep was adequate enough to assess for inflammatory changes but small or flat lesions may not have been appreciated in some portions of the colon. - Biopsies were taken with a cold forceps for histology in the right, transverse, and left colon.  1. Surgical [P], random right colon sites - COLONIC MUCOSA WITH NO SPECIFIC HISTOPATHOLOGIC CHANGES - NEGATIVE FOR ACUTE INFLAMMATION, FEATURES OF CHRONICITY, GRANULOMAS OR DYSPLASIA 2. Surgical [P], colon, transverse -  INACTIVE CHRONIC COLITIS, MANIFESTED ONLY BY FOCAL MILD ARCHITECTURAL CHANGES - NEGATIVE FOR GRANULOMAS OR DYSPLASIA 3. Surgical [P], random left colon sites - COLONIC MUCOSA WITH NO SPECIFIC HISTOPATHOLOGIC CHANGES - NEGATIVE FOR ACUTE INFLAMMATION, FEATURES OF CHRONICITY, GRANULOMAS OR DYSPLASIA 4. Surgical [P], colon, rectum, polyp (1) - HYPERPLASTIC POLYP   Entyvio levels 01/17/22 - level of 7.7, AB undetectable - increased dosing to q 4 weeks this past summer- July   IDA noted, EGD done  EGD  05/02/22: - The exam of the esophagus was otherwise normal. - Evidence of a Roux-en-Y gastrojejunostomy was found. The gastrojejunal anastomosis was characterized by 2 small areas of ulceration. - The gastric pouch was otherwise small but normal. - Biopsies were taken with a cold forceps for Helicobacter pylori testing. - The examined small bowel limb and blind loop were normal.   Past Medical History:  Diagnosis Date   Anemia    Anxiety    Arthritis    Blood transfusion without reported diagnosis    Cataract    CHF (congestive heart failure) ([place])    Chronic kidney disease    UTI, hematuria in urine   Colitis    Crohn's disease ([place])    Depression    Diabetes ([place])    Diverticulosis    Frequent headaches    Interstitial cystitis    Recurrent UTI    Restless leg syndrome    TIA (transient ischemic attack)    more than year ago per pt 12-26-21   TIA (transient ischemic attack) 02/20/2021   Urinary frequency      Past Surgical History:  Procedure Laterality Date   bariatric bypass  2012   BIOPSY  05/03/2020   Procedure: BIOPSY;  Surgeon: Milus Banister, MD;  Location: [facility];  Service: Endoscopy;;   CARPAL TUNNEL RELEASE Right 2003   CARPAL TUNNEL RELEASE Right    2008   CHOLECYSTECTOMY  1975   COLONOSCOPY     COLONOSCOPY WITH PROPOFOL N/A 05/03/2020   Procedure: COLONOSCOPY WITH PROPOFOL;  Surgeon: Milus Banister, MD;  Location: [facility] ENDOSCOPY;  Service: Endoscopy;  Laterality: N/A;   CYSTOSCOPY W/ RETROGRADES Bilateral 06/06/2015   Procedure: CYSTOSCOPY WITH RETROGRADE PYELOGRAM;  Surgeon: Festus Aloe, MD;  Location: [facility] ORS;  Service: Urology;  Laterality: Bilateral;   FL INJ LEFT KNEE CT ARTHROGRAM ([facility] HX) Left    1995   GASTRIC BYPASS  2010   HAND SURGERY Left 01/19/2021   Thumb   HEMORRHOID SURGERY  2013   KNEE ARTHROSCOPY Left 1996   TONSILLECTOMY     TOTAL ABDOMINAL HYSTERECTOMY W/ BILATERAL SALPINGOOPHORECTOMY     Family History  Problem  Relation Age of Onset   Colon cancer Mother    Stroke Father    Heart failure Sister    Bladder Cancer Neg Hx    Kidney disease Neg Hx    Prostate cancer Neg Hx    Kidney cancer Neg Hx    Pancreatic cancer Neg Hx    Esophageal cancer Neg Hx    Stomach cancer Neg Hx    Rectal cancer Neg Hx    Breast cancer Neg Hx    Social History   Tobacco Use   Smoking status: Former    Packs/day: 0.50    Types: Cigarettes    Quit date: 04/25/1975    Years since quitting: 47.2   Smokeless tobacco: Never   Tobacco comments:    quit 40 years  Vaping Use   Vaping Use: Never used  Substance Use Topics   Alcohol use: No    Alcohol/week: 0.0 standard drinks of alcohol   Drug use: No   Current Outpatient Medications  Medication Sig Dispense Refill   ALPRAZolam (XANAX) 1 MG tablet Take 1 tablet (1 mg total) by mouth 3 (three) times daily as needed. for anxiety 90 tablet 0   buPROPion (WELLBUTRIN XL) 150 MG 24 hr tablet Take 3 tablets (450 mg total) by mouth daily. 270 tablet 3   furosemide (LASIX) 20 MG tablet Take 1 tablet (20 mg total) by mouth daily. May take an additional tablet (52m) AS NEEDED for weight gain and/or swelling. 90 tablet 3   lamoTRIgine (LAMICTAL) 200 MG tablet Take 2 tablets (400 mg total) by mouth at bedtime. 60 tablet 11   metFORMIN (GLUCOPHAGE) 1000 MG tablet Take 1 tablet by mouth twice daily. 180 tablet 1   omeprazole (PRILOSEC) 40 MG capsule Omeprazole 40 mg once daily for 6 weeks 30 capsule 1   ondansetron (ZOFRAN-ODT) 8 MG disintegrating tablet Take 1 tablet (8 mg total) by mouth every 8 (eight) hours as needed for nausea or vomiting. 60 tablet 1   promethazine (PHENERGAN) 25 MG tablet Take 1 tablet (25 mg total) by mouth every 8 (eight) hours as needed for nausea or vomiting. 20 tablet 0   QUEtiapine (SEROQUEL) 300 MG tablet TAKE 1 TABLET BY MOUTH AT BEDTIME 30 tablet 0   Rimegepant Sulfate (NURTEC) 75 MG TBDP Take 1 tablet by mouth daily as needed. 10 tablet 0    rOPINIRole (REQUIP) 1 MG tablet Take 1 tablet (1 mg total) by mouth 2 (two) times daily. 180 tablet 1   rosuvastatin (CRESTOR) 5 MG tablet Take 1 tablet (5 mg total) by mouth daily. 90 tablet 1   sertraline (ZOLOFT) 100 MG tablet Take 2 tablets by mouth once daily 60 tablet 3   tirzepatide (MOUNJARO) 5 MG/0.5ML Pen Inject 5 mg into the skin once a week. 6 mL 1   vedolizumab (ENTYVIO) 300 MG injection Inject into the vein every 8 (eight) weeks.     Vitamin D, Ergocalciferol, (DRISDOL) 1.25 MG (50000 UNIT) CAPS capsule Take 1 capsule by mouth once a week 2 capsule 0   Continuous Blood Gluc Receiver (FREESTYLE LIBRE READER) DEVI 1 Dose by Does not apply route every 14 (fourteen) days. (Patient not taking: Reported on 07/05/2022) 1 each 0   Continuous Blood Gluc Sensor (FREESTYLE LIBRE 14 DAY SENSOR) MISC 1 each by Does not apply route every 14 (fourteen) days. (Patient not taking: Reported on 07/05/2022) 12 each 0   No current facility-administered medications for this visit.   Allergies  Allergen Reactions   Avelox [Moxifloxacin Hcl In Nacl] Anaphylaxis   Bactrim [Sulfamethoxazole-Trimethoprim] Anaphylaxis   Ciprofloxacin Other (See Comments)    Pt states she was told never to take this as it is in the same family as Avelox.    Neomycin-Polymyxin-Dexameth Other (See Comments)    Burning in the eyes   Depakote [Divalproex Sodium] Other (See Comments)    Unknown Reaction   Imitrex [Sumatriptan] Other (See Comments)    Neck and shoulder pain   Stadol [Butorphanol] Rash     Review of Systems: All systems reviewed and negative except where noted in HPI.     Physical Exam: BP 100/62   Pulse (!) 101   Ht 5' (1.524 m)   Wt 137 lb (62.1 kg)   LMP  (LMP Unknown)   BMI 26.76 kg/m  Constitutional: Pleasant,well-developed, female in  no acute distress. Abdominal: Soft, nondistended, nontender. There are no masses palpable.  Neurological: Alert and oriented to person place and  time. Psychiatric: Normal mood and affect. Behavior is normal.   ASSESSMENT: 66 y.o. female here for assessment of the following  1. Nausea and vomiting, unspecified vomiting type   2. Loss of weight   3. Indeterminate colitis   4. Marginal ulcer   5. Iron deficiency anemia, unspecified iron deficiency anemia type    Main complaint is nausea, poor appetite, loss of weight.  She has been dealing with this for the past year but worsening in recent months.  Recently transition from Trulicity to [place].  I do not think this is a good class of medications for her to be on with all of the symptoms, counseled her the main risks are nausea and vomiting, given her weight loss and poor p.o. intake I think she needs to stop the [facility].  She should reach out to her prescribing provider of this to discuss her options if she stops it, I will reach out to them as well.  Will switch her back to Zofran 8 mg every 8 hours as needed ODT to see if that may work better than the Phenergan.  She had a marginal ulcer on her last endoscopy, she is taking omeprazole but I think she would do better if she cracks the capsule open to maximize her absorption.  She will work on doing this.  I think this is likely the cause of her iron deficiency in the setting of a gastric bypass state in general.  She has not been taking any oral iron, I counseled her she needs to be on baseline iron or she will continue to remain iron deficient.  I will recheck her CBC to make sure her hemoglobin has not dropped further and recheck her iron studies today.  Assuming she has a significant iron deficiency anemia I think she may do better with IV iron.  Will await her lab results and let her know if she is a candidate for that.  Otherwise, now on higher dosing of Entyvio, her lower tract symptoms fairly well-controlled although if she had active colitis that could be contributing to nausea as well.  Recommend fecal calprotectin to assess  inflammatory burden in her colon.  Hopefully higher dosing of [name] is working okay for her.  Will give her some IBgard to use as needed for cramps and bloating and see if that helps.  PLAN: - recommend stopping Mounjaro and that class of medications given significant upper tract symptoms, will discuss with PCP - switch to Zofran 16m ODT every 8 hours PRN  - crack open omeprazole when taking, 436m/ day - IB gard samples PRN bloating / spasm - lab today for CBC, TIBC / ferritin panel - may need IV iron - stool for fecal calprotectin - consider CT abdomen / pelvis if no improvement  I spent 35 minutes of time, including in depth chart review, face-to-face time with the patient, coordinating care, and documentation of the encounter.   StJolly MangoMD [facility]  CC: HoJon BillingsNP

## 2022-07-05 NOTE — Progress Notes (Signed)
Diagnosis: Iron Deficiency Anemia  Provider:  Marshell Garfinkel MD  Procedure: Infusion  IV Type: Peripheral, IV Location: L Antecubital  [place] (Romosozumab-aqqg), Dose: 300 mg  Infusion Start Time: 0881  Infusion Stop Time: 1031  Post Infusion IV Care: Peripheral IV Discontinued  Discharge: Condition: Good, Destination: Home . AVS provided to patient.   Performed by:  Arnoldo Morale, RN

## 2022-07-05 NOTE — Patient Instructions (Addendum)
Your provider has requested that you go to the basement level for lab work before leaving today. Press "B" on the elevator. The lab is located at the first door on the left as you exit the elevator.  Please speak to your primary care physician regarding stopping your [facility].   Open your omeprazole capsules when taking them.  Please take your IB gard samples as directed on box for bloating.   The [facility] providers would like to encourage you to use [facility] to communicate with providers for non-urgent requests or questions.  Due to long hold times on the telephone, sending your provider a message by [facility] may be a faster and more efficient way to get a response.  Please allow 48 business hours for a response.  Please remember that this is for non-urgent requests.   Due to recent changes in healthcare laws, you may see the results of your imaging and laboratory studies on MyChart before your provider has had a chance to review them.  We understand that in some cases there may be results that are confusing or concerning to you. Not all laboratory results come back in the same time frame and the provider may be waiting for multiple results in order to interpret others.  Please give [name] 48 hours in order for your provider to thoroughly review all the results before contacting the office for clarification of your results.   Thank you for entrusting me with your care and for choosing [facility], Dr. Gilbert Cellar

## 2022-07-06 ENCOUNTER — Telehealth: Payer: Self-pay | Admitting: Nurse Practitioner

## 2022-07-06 ENCOUNTER — Encounter: Payer: Self-pay | Admitting: Gastroenterology

## 2022-07-06 ENCOUNTER — Ambulatory Visit: Payer: BC Managed Care – PPO | Admitting: Gastroenterology

## 2022-07-06 DIAGNOSIS — H04202 Unspecified epiphora, left lacrimal gland: Secondary | ICD-10-CM | POA: Diagnosis not present

## 2022-07-06 DIAGNOSIS — Z8673 Personal history of transient ischemic attack (TIA), and cerebral infarction without residual deficits: Secondary | ICD-10-CM | POA: Diagnosis not present

## 2022-07-06 DIAGNOSIS — E785 Hyperlipidemia, unspecified: Secondary | ICD-10-CM | POA: Diagnosis not present

## 2022-07-06 DIAGNOSIS — I509 Heart failure, unspecified: Secondary | ICD-10-CM | POA: Diagnosis not present

## 2022-07-06 DIAGNOSIS — I110 Hypertensive heart disease with heart failure: Secondary | ICD-10-CM | POA: Diagnosis not present

## 2022-07-06 DIAGNOSIS — H04552 Acquired stenosis of left nasolacrimal duct: Secondary | ICD-10-CM | POA: Diagnosis not present

## 2022-07-06 DIAGNOSIS — H04512 Dacryolith of left lacrimal passage: Secondary | ICD-10-CM | POA: Diagnosis not present

## 2022-07-06 HISTORY — PX: EYE SURGERY: SHX253

## 2022-07-06 NOTE — Telephone Encounter (Signed)
Pt states she was advised by her gastrologist to d/c  tirzepatide [facility]) 5 MG/0.5ML Pen but it would be the pts decision   Pt states she does not want to d/c the medication and  requesting a cb from provider to for her opinion on d/c the medication  Please assist further

## 2022-07-06 NOTE — Addendum Note (Signed)
Addended by: Yevette Edwards on: 07/06/2022 11:37 AM   Modules accepted: Orders

## 2022-07-09 ENCOUNTER — Encounter: Payer: Self-pay | Admitting: Gastroenterology

## 2022-07-09 ENCOUNTER — Encounter: Payer: Self-pay | Admitting: Oncology

## 2022-07-09 NOTE — Progress Notes (Unsigned)
   LMP  (LMP Unknown)    Subjective:    Patient ID: Amy Hansen, female    DOB: [birth year], 66 y.o.   MRN: [id number]  HPI: Amy Hansen is a 66 y.o. female  No chief complaint on file.  DIABETES Hypoglycemic episodes:{Blank single:19197::"yes","no"} Polydipsia/polyuria: {Blank single:19197::"yes","no"} Visual disturbance: {Blank single:19197::"yes","no"} Chest pain: {Blank single:19197::"yes","no"} Paresthesias: {Blank single:19197::"yes","no"} Glucose Monitoring: {Blank single:19197::"yes","no"}  Accucheck frequency: {Blank single:19197::"Not Checking","Daily","BID","TID"}  Fasting glucose:  Post prandial:  Evening:  Before meals: Taking Insulin?: {Blank single:19197::"yes","no"}  Long acting insulin:  Short acting insulin: Blood Pressure Monitoring: {Blank single:19197::"not checking","rarely","daily","weekly","monthly","a few times a day","a few times a week","a few times a month"} Retinal Examination: {Blank single:19197::"Up to Date","Not up to Date"} Foot Exam: {Blank single:19197::"Up to Date","Not up to Date"} Diabetic Education: {Blank single:19197::"Completed","Not Completed"} Pneumovax: {Blank single:19197::"Up to Date","Not up to Date","unknown"} Influenza: {Blank single:19197::"Up to Date","Not up to Date","unknown"} Aspirin: {Blank single:19197::"yes","no"}  Relevant past medical, surgical, family and social history reviewed and updated as indicated. Interim medical history since our last visit reviewed. Allergies and medications reviewed and updated.  Review of Systems  Per HPI unless specifically indicated above     Objective:    LMP  (LMP Unknown)   Wt Readings from Last 3 Encounters:  07/05/22 137 lb (62.1 kg)  07/05/22 137 lb 9.6 oz (62.4 kg)  06/20/22 139 lb 9.6 oz (63.3 kg)    Physical Exam  Results for orders placed or performed in visit on 07/05/22  IBC + Ferritin  Result Value Ref Range   Iron 27 (L) 42 - 145 ug/dL   Transferrin  309.0 212.0 - 360.0 mg/dL   Saturation Ratios 6.2 (L) 20.0 - 50.0 %   Ferritin 5.7 (L) 10.0 - 291.0 ng/mL   TIBC 432.6 250.0 - 450.0 mcg/dL  CBC with Differential/Platelet  Result Value Ref Range   WBC 4.4 4.0 - 10.5 K/uL   RBC 4.21 3.87 - 5.11 Mil/uL   Hemoglobin 10.6 (L) 12.0 - 15.0 g/dL   HCT 33.1 (L) 36.0 - 46.0 %   MCV 78.6 78.0 - 100.0 fl   MCHC 31.9 30.0 - 36.0 g/dL   RDW 17.8 (H) 11.5 - 15.5 %   Platelets 392.0 150.0 - 400.0 K/uL   Neutrophils Relative % 52.5 43.0 - 77.0 %   Lymphocytes Relative 39.4 12.0 - 46.0 %   Monocytes Relative 7.1 3.0 - 12.0 %   Eosinophils Relative 0.7 0.0 - 5.0 %   Basophils Relative 0.3 0.0 - 3.0 %   Neutro Abs 2.3 1.4 - 7.7 K/uL   Lymphs Abs 1.8 0.7 - 4.0 K/uL   Monocytes Absolute 0.3 0.1 - 1.0 K/uL   Eosinophils Absolute 0.0 0.0 - 0.7 K/uL   Basophils Absolute 0.0 0.0 - 0.1 K/uL      Assessment & Plan:   Problem List Items Addressed This Visit       Endocrine   Poorly controlled type 2 diabetes mellitus ([place]) - Primary     Follow up plan: No follow-ups on file.

## 2022-07-09 NOTE — Telephone Encounter (Addendum)
Auth Submission: NO AUTH NEEDED Payer: [employer] Medication & CPT/J Code(s) submitted: Feraheme (ferumoxytol) [id number] Route of submission (phone, fax, portal): cover my meds Phone # Fax # Auth type: Buy/Bill Units/visits requested: X2 Reference number: [id number] Approval from:07/09/22   to 07/29/22  at

## 2022-07-10 ENCOUNTER — Encounter: Payer: Self-pay | Admitting: Nurse Practitioner

## 2022-07-10 ENCOUNTER — Ambulatory Visit: Payer: BC Managed Care – PPO | Admitting: Nurse Practitioner

## 2022-07-10 ENCOUNTER — Other Ambulatory Visit: Payer: BC Managed Care – PPO

## 2022-07-10 VITALS — BP 109/70 | HR 108 | Temp 98.0°F | Wt 137.6 lb

## 2022-07-10 DIAGNOSIS — E1165 Type 2 diabetes mellitus with hyperglycemia: Secondary | ICD-10-CM

## 2022-07-10 NOTE — Assessment & Plan Note (Signed)
Chronic. Improving. Will check A1c at next visit.  Sugars are running around 100.  Patient has elected to stay on the [facility].  She is happy with her sugars and does not feel like the nausea and vomiting are related to the medication.  Patient has been on [facility] for about 6 weeks and symptoms were persistent prior to starting the mediation.  If she chooses to switch medications will need to add SGLT2 and possibly long acting insulin.  Follow up in 1 month.  Call sooner if concerns arise.

## 2022-07-10 NOTE — Assessment & Plan Note (Signed)
>>  ASSESSMENT AND PLAN FOR DIABETES MELLITUS TYPE 2 IN NONOBESE (HCC) WRITTEN ON 07/10/2022 11:50 AM BY HOLDSWORTH, KAREN, NP  Chronic. Improving. Will check A1c at next visit.  Sugars are running around 100.  Patient has elected to stay on the [facility].  She is happy with her sugars and does not feel like the nausea and vomiting are related to the medication.  Patient has been on [facility] for about 6 weeks and symptoms were persistent prior to starting the mediation.  If she chooses to switch medications will need to add SGLT2 and possibly long acting insulin.  Follow up in 1 month.  Call sooner if concerns arise.

## 2022-07-12 ENCOUNTER — Ambulatory Visit (INDEPENDENT_AMBULATORY_CARE_PROVIDER_SITE_OTHER): Payer: BC Managed Care – PPO

## 2022-07-12 VITALS — BP 114/75 | HR 98 | Temp 98.4°F | Resp 18 | Ht 60.0 in | Wt 133.6 lb

## 2022-07-12 DIAGNOSIS — D509 Iron deficiency anemia, unspecified: Secondary | ICD-10-CM | POA: Diagnosis not present

## 2022-07-12 MED ORDER — DIPHENHYDRAMINE HCL 25 MG PO CAPS
25.0000 mg | ORAL_CAPSULE | Freq: Once | ORAL | Status: AC
  Administered 2022-07-12: 25 mg via ORAL
  Filled 2022-07-12: qty 1

## 2022-07-12 MED ORDER — ACETAMINOPHEN 325 MG PO TABS
650.0000 mg | ORAL_TABLET | Freq: Once | ORAL | Status: AC
  Administered 2022-07-12: 650 mg via ORAL
  Filled 2022-07-12: qty 2

## 2022-07-12 MED ORDER — SODIUM CHLORIDE 0.9 % IV SOLN
510.0000 mg | Freq: Once | INTRAVENOUS | Status: AC
  Administered 2022-07-12: 510 mg via INTRAVENOUS
  Filled 2022-07-12: qty 17

## 2022-07-12 NOTE — Progress Notes (Signed)
Diagnosis: Iron Deficiency Anemia  Provider:  Marshell Garfinkel MD  Procedure: Infusion  IV Type: Peripheral, IV Location: L Forearm  Feraheme (Ferumoxytol), Dose: 510 mg  Infusion Start Time: 0746  Infusion Stop Time: 0029  Post Infusion IV Care: Observation period completed and Peripheral IV Discontinued  Discharge: Condition: Good, Destination: Home . AVS provided to patient.   Performed by:  Arnoldo Morale, RN

## 2022-07-16 ENCOUNTER — Ambulatory Visit: Payer: Self-pay

## 2022-07-16 DIAGNOSIS — Z881 Allergy status to other antibiotic agents status: Secondary | ICD-10-CM | POA: Diagnosis not present

## 2022-07-16 DIAGNOSIS — Z8673 Personal history of transient ischemic attack (TIA), and cerebral infarction without residual deficits: Secondary | ICD-10-CM | POA: Diagnosis not present

## 2022-07-16 DIAGNOSIS — R4182 Altered mental status, unspecified: Secondary | ICD-10-CM | POA: Diagnosis not present

## 2022-07-16 DIAGNOSIS — I5032 Chronic diastolic (congestive) heart failure: Secondary | ICD-10-CM | POA: Diagnosis not present

## 2022-07-16 DIAGNOSIS — I110 Hypertensive heart disease with heart failure: Secondary | ICD-10-CM | POA: Diagnosis not present

## 2022-07-16 DIAGNOSIS — D649 Anemia, unspecified: Secondary | ICD-10-CM | POA: Diagnosis not present

## 2022-07-16 DIAGNOSIS — Z79899 Other long term (current) drug therapy: Secondary | ICD-10-CM | POA: Diagnosis not present

## 2022-07-16 DIAGNOSIS — E119 Type 2 diabetes mellitus without complications: Secondary | ICD-10-CM | POA: Diagnosis not present

## 2022-07-16 DIAGNOSIS — E785 Hyperlipidemia, unspecified: Secondary | ICD-10-CM | POA: Diagnosis not present

## 2022-07-16 DIAGNOSIS — R410 Disorientation, unspecified: Secondary | ICD-10-CM | POA: Diagnosis not present

## 2022-07-16 NOTE — Telephone Encounter (Signed)
FYI

## 2022-07-16 NOTE — Telephone Encounter (Signed)
  Chief Complaint: Neurological deficit Symptoms: Unable to remember words, confusion Frequency: Yesterday and some today Pertinent Negatives: Patient denies Chest pain Disposition: [] ED /[] Urgent Care (no appt availability in office) / [] Appointment(In office/virtual)/ []  [facility]/ [] Home Care/ [x] Refused Recommended Disposition /[] [facility]/ []  Follow-up with PCP Additional Notes: PT has a history of stroke. Yesterday pt was unable to remember words, and was confused.  PT continues to repeat herself in conversation.  PT states that she can now remember words she could not come up with yesterday in conversation. Pt refuses ED. PT has ov for weds.    Reason for Disposition  [1] Loss of speech or garbled speech AND [2] sudden onset AND [3] brief (now gone)  Answer Assessment - Initial Assessment Questions 1. SYMPTOM: "What is the main symptom you are concerned about?" (e.g., weakness, numbness)     Memory loss, confusion 2. ONSET: "When did this start?" (minutes, hours, days; while sleeping)     Yesterday - 12:30 pm 3. LAST NORMAL: "When was the last time you (the patient) were normal (no symptoms)?"     Confusion all morning - could not complete sentences 4. PATTERN "Does this come and go, or has it been constant since it started?"  "Is it present now?"     Comes and goes 5. CARDIAC SYMPTOMS: "Have you had any of the following symptoms: chest pain, difficulty breathing, palpitations?"     Difficulty breathing - takes a lot of deep breaths 6. NEUROLOGIC SYMPTOMS: "Have you had any of the following symptoms: headache, dizziness, vision loss, double vision, changes in speech, unsteady on your feet?"     Confusion, unable to remember words. 7. OTHER SYMPTOMS: "Do you have any other symptoms?"     Confusion, Memory loss, unable to complete sentences 8. PREGNANCY: "Is there any chance you are pregnant?" "When was your last menstrual period?"  Protocols used:  Neurologic Deficit-A-AH

## 2022-07-16 NOTE — Telephone Encounter (Signed)
Called and advised patient that per Santiago Glad, she would like for her to go to the ER for evaluation. Patient states she is not going to [facility] but plans to go to [facility].

## 2022-07-16 NOTE — Telephone Encounter (Signed)
Please call patient and reiterate she should be seen in the ER for further evaluation.

## 2022-07-17 DIAGNOSIS — R410 Disorientation, unspecified: Secondary | ICD-10-CM | POA: Diagnosis not present

## 2022-07-17 NOTE — Progress Notes (Unsigned)
   LMP  (LMP Unknown)    Subjective:    Patient ID: Amy Hansen, female    DOB: 11-15-1955, 66 y.o.   MRN: [id number]  HPI: Amy Hansen is a 66 y.o. female  No chief complaint on file.   Relevant past medical, surgical, family and social history reviewed and updated as indicated. Interim medical history since our last visit reviewed. Allergies and medications reviewed and updated.  Review of Systems  Per HPI unless specifically indicated above     Objective:    LMP  (LMP Unknown)   Wt Readings from Last 3 Encounters:  07/12/22 133 lb 9.6 oz (60.6 kg)  07/10/22 137 lb 9.6 oz (62.4 kg)  07/05/22 137 lb (62.1 kg)    Physical Exam  Results for orders placed or performed in visit on 07/05/22  IBC + Ferritin  Result Value Ref Range   Iron 27 (L) 42 - 145 ug/dL   Transferrin 309.0 212.0 - 360.0 mg/dL   Saturation Ratios 6.2 (L) 20.0 - 50.0 %   Ferritin 5.7 (L) 10.0 - 291.0 ng/mL   TIBC 432.6 250.0 - 450.0 mcg/dL  CBC with Differential/Platelet  Result Value Ref Range   WBC 4.4 4.0 - 10.5 K/uL   RBC 4.21 3.87 - 5.11 Mil/uL   Hemoglobin 10.6 (L) 12.0 - 15.0 g/dL   HCT 33.1 (L) 36.0 - 46.0 %   MCV 78.6 78.0 - 100.0 fl   MCHC 31.9 30.0 - 36.0 g/dL   RDW 17.8 (H) 11.5 - 15.5 %   Platelets 392.0 150.0 - 400.0 K/uL   Neutrophils Relative % 52.5 43.0 - 77.0 %   Lymphocytes Relative 39.4 12.0 - 46.0 %   Monocytes Relative 7.1 3.0 - 12.0 %   Eosinophils Relative 0.7 0.0 - 5.0 %   Basophils Relative 0.3 0.0 - 3.0 %   Neutro Abs 2.3 1.4 - 7.7 K/uL   Lymphs Abs 1.8 0.7 - 4.0 K/uL   Monocytes Absolute 0.3 0.1 - 1.0 K/uL   Eosinophils Absolute 0.0 0.0 - 0.7 K/uL   Basophils Absolute 0.0 0.0 - 0.1 K/uL      Assessment & Plan:   Problem List Items Addressed This Visit   None    Follow up plan: No follow-ups on file.

## 2022-07-18 ENCOUNTER — Encounter: Payer: Self-pay | Admitting: Nurse Practitioner

## 2022-07-18 ENCOUNTER — Ambulatory Visit: Payer: BC Managed Care – PPO | Admitting: Nurse Practitioner

## 2022-07-18 VITALS — BP 108/71 | HR 102 | Temp 97.7°F | Wt 132.8 lb

## 2022-07-18 DIAGNOSIS — R8290 Unspecified abnormal findings in urine: Secondary | ICD-10-CM | POA: Diagnosis not present

## 2022-07-18 DIAGNOSIS — Z23 Encounter for immunization: Secondary | ICD-10-CM | POA: Diagnosis not present

## 2022-07-18 DIAGNOSIS — R410 Disorientation, unspecified: Secondary | ICD-10-CM | POA: Diagnosis not present

## 2022-07-18 NOTE — Assessment & Plan Note (Signed)
Worsened over the weekend.  Patient was seen in the ER on 07/16/22.  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  She was not able to leave a urine sample in the ER- will obtain today.  MRI ordered for further evaluation.  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  Encouraged patient to keep her appointment with neurology for next week.  Will make recommendations based on imaging results.  If UTI present will treat patient appropriately.

## 2022-07-19 ENCOUNTER — Ambulatory Visit (INDEPENDENT_AMBULATORY_CARE_PROVIDER_SITE_OTHER): Payer: BC Managed Care – PPO

## 2022-07-19 VITALS — BP 109/72 | HR 86 | Temp 97.7°F | Resp 16 | Ht 60.0 in | Wt 132.6 lb

## 2022-07-19 DIAGNOSIS — D509 Iron deficiency anemia, unspecified: Secondary | ICD-10-CM

## 2022-07-19 DIAGNOSIS — R410 Disorientation, unspecified: Secondary | ICD-10-CM | POA: Diagnosis not present

## 2022-07-19 DIAGNOSIS — R8290 Unspecified abnormal findings in urine: Secondary | ICD-10-CM | POA: Diagnosis not present

## 2022-07-19 LAB — URINALYSIS, ROUTINE W REFLEX MICROSCOPIC
Bilirubin, UA: NEGATIVE
Glucose, UA: NEGATIVE
Ketones, UA: NEGATIVE
Nitrite, UA: POSITIVE — AB
Protein,UA: NEGATIVE
RBC, UA: NEGATIVE
Specific Gravity, UA: 1.015 (ref 1.005–1.030)
Urobilinogen, Ur: 0.2 mg/dL (ref 0.2–1.0)
pH, UA: 5 (ref 5.0–7.5)

## 2022-07-19 LAB — MICROSCOPIC EXAMINATION

## 2022-07-19 MED ORDER — SODIUM CHLORIDE 0.9 % IV SOLN
510.0000 mg | Freq: Once | INTRAVENOUS | Status: AC
  Administered 2022-07-19: 510 mg via INTRAVENOUS
  Filled 2022-07-19: qty 17

## 2022-07-19 MED ORDER — ACETAMINOPHEN 325 MG PO TABS
650.0000 mg | ORAL_TABLET | Freq: Once | ORAL | Status: AC
  Administered 2022-07-19: 650 mg via ORAL
  Filled 2022-07-19: qty 2

## 2022-07-19 MED ORDER — DIPHENHYDRAMINE HCL 25 MG PO CAPS
25.0000 mg | ORAL_CAPSULE | Freq: Once | ORAL | Status: AC
  Administered 2022-07-19: 25 mg via ORAL
  Filled 2022-07-19: qty 1

## 2022-07-19 MED ORDER — NITROFURANTOIN MONOHYD MACRO 100 MG PO CAPS: 100.0000 mg | ORAL_CAPSULE | Freq: Two times a day (BID) | ORAL | 0 refills | Status: DC

## 2022-07-19 NOTE — Progress Notes (Signed)
Diagnosis: Iron Deficiency Anemia  Provider:  Marshell Garfinkel MD  Procedure: Infusion  IV Type: Peripheral, IV Location: L Antecubital  Feraheme (Ferumoxytol), Dose: 510 mg  Infusion Start Time: 0814  Infusion Stop Time: 4818  Post Infusion IV Care: Peripheral IV Discontinued  Discharge: Condition: Good, Destination: Home . AVS provided to patient.   Performed by:  Arnoldo Morale, RN

## 2022-07-19 NOTE — Addendum Note (Signed)
Addended by: [name] on: 07/19/2022 10:13 AM   Modules accepted: Orders

## 2022-07-19 NOTE — Progress Notes (Signed)
Please let patient know that she does have a UTI.  I have sent in [place] which is an antibiotic to treat this.

## 2022-07-20 ENCOUNTER — Other Ambulatory Visit: Payer: Self-pay | Admitting: Physician Assistant

## 2022-07-20 ENCOUNTER — Ambulatory Visit: Payer: BC Managed Care – PPO | Admitting: Physician Assistant

## 2022-07-22 ENCOUNTER — Other Ambulatory Visit: Payer: Self-pay | Admitting: Physician Assistant

## 2022-07-23 LAB — URINE CULTURE

## 2022-07-24 ENCOUNTER — Ambulatory Visit: Payer: BC Managed Care – PPO | Attending: Physician Assistant

## 2022-07-24 DIAGNOSIS — R0609 Other forms of dyspnea: Secondary | ICD-10-CM

## 2022-07-24 LAB — ECHOCARDIOGRAM COMPLETE
AR max vel: 1.74 cm2
AV Area VTI: 1.87 cm2
AV Area mean vel: 1.67 cm2
AV Mean grad: 5 mmHg
AV Peak grad: 7.8 mmHg
Ao pk vel: 1.4 m/s
Area-P 1/2: 2.52 cm2
Calc EF: 53.8 %
S' Lateral: 2.8 cm
Single Plane A2C EF: 54.1 %
Single Plane A4C EF: 53.1 %

## 2022-07-24 NOTE — Progress Notes (Signed)
Please make sure patient picked up her prescription for macrobid and that she completed it.

## 2022-07-28 DIAGNOSIS — L03113 Cellulitis of right upper limb: Secondary | ICD-10-CM | POA: Diagnosis not present

## 2022-07-31 NOTE — Progress Notes (Deleted)
LMP  (LMP Unknown)    Subjective:    Patient ID: Amy Hansen, female    DOB: [birth year], 67 y.o.   MRN: [id number]  HPI: Amy Hansen is a 67 y.o. female  No chief complaint on file.  RESTLESS LEGS & FALL She reports she ran out of Mirapex, she ran out because she has been taking more then ordered due to symptoms during afternoon time, at nap time.  This is why she ran out of medication.  She went to try to get medication recently, her room is blacked out.  She fell trying to get medication, lamp fell on her.  When she tried to get up to put lamp back she fell, no injuries with this or loss of consciousness. Duration: chronic Discomfort description:  cramping Pain: yes Location: lower legs Bilateral: yes Symmetric: yes Severity: 10/10 Onset:  gradual Frequency:  intermittent Symptoms only occur while legs at rest: yes Sudden unintentional leg jerking: no Bed partner bothered by leg movements: no LE numbness: no Decreased sensation: no Weakness: no Insomnia: no Daytime somnolence: no Fatigue: no Alleviating factors: medication Aggravating factors: resting Status: fluctuating Treatments attempted:  Mirapex  DIABETES Last A1c 9% in April.  Her glucometer broke and she hasn't been able to check her sugar.  To be taking Metformin 1000 MG BID, Glipizide 5 MG BID, Trulicity 4.43 MG.   Hypoglycemic episodes: yes Polydipsia/polyuria: no Visual disturbance: no Chest pain: no Paresthesias: no Glucose Monitoring: yes  Accucheck frequency:   Fasting glucose: 80-300  Post prandial:  Evening:  Before meals: Taking Insulin?: no  Long acting insulin:  Short acting insulin: Blood Pressure Monitoring: not checking Retinal Examination: Up to Date -- Dr. Edison Pace ([place]) Foot Exam: Up to Date Pneumovax: Up to Date Influenza: Up to Date Aspirin: no   ANEMIA Anemia status: controlled Etiology of anemia: Duration of anemia treatment:  Compliance with treatment:  excellent compliance Iron supplementation side effects: no Severity of anemia: mild Fatigue: no Decreased exercise tolerance: no  Dyspnea on exertion: no Palpitations: no Bleeding: no Pica: no  MIGRAINES Duration: years Onset: gradual Severity: moderate Frequency:  3-4 x per month Headache status at time of visit: asymptomatic Treatments attempted: Treatments attempted: aleve", excedrine and triptans   Aura: yes Nausea:  yes Vomiting: yes Photophobia:  yes Phonophobia:  yes Effect on social functioning:  yes   Relevant past medical, surgical, family and social history reviewed and updated as indicated. Interim medical history since our last visit reviewed. Allergies and medications reviewed and updated.  Review of Systems  Constitutional:  Negative for activity change, appetite change, diaphoresis, fatigue and fever.  Respiratory:  Negative for cough, chest tightness and shortness of breath.   Cardiovascular:  Negative for chest pain, palpitations and leg swelling.  Gastrointestinal: Negative.   Endocrine: Negative for polydipsia, polyphagia and polyuria.  Neurological:  Positive for headaches.  Psychiatric/Behavioral:  Positive for dysphoric mood. Negative for suicidal ideas. The patient is nervous/anxious.     Per HPI unless specifically indicated above     Objective:    LMP  (LMP Unknown)   Wt Readings from Last 3 Encounters:  07/19/22 132 lb 9.6 oz (60.1 kg)  07/18/22 132 lb 12.8 oz (60.2 kg)  07/12/22 133 lb 9.6 oz (60.6 kg)    Physical Exam Vitals and nursing note reviewed.  Constitutional:      General: She is awake. She is not in acute distress.    Appearance: Normal appearance. She is  well-developed, well-groomed and normal weight. She is not ill-appearing, toxic-appearing or diaphoretic.  HENT:     Head: Normocephalic.     Right Ear: Hearing and external ear normal.     Left Ear: Hearing and external ear normal.     Nose: Nose normal.      Mouth/Throat:     Mouth: Mucous membranes are moist.     Pharynx: Oropharynx is clear.  Eyes:     General: Lids are normal.        Right eye: No discharge.        Left eye: No discharge.     Extraocular Movements: Extraocular movements intact.     Conjunctiva/sclera: Conjunctivae normal.     Pupils: Pupils are equal, round, and reactive to light.  Neck:     Thyroid: No thyromegaly.     Vascular: No carotid bruit.  Cardiovascular:     Rate and Rhythm: Normal rate and regular rhythm.     Heart sounds: Normal heart sounds. No murmur heard.    No gallop.  Pulmonary:     Effort: Pulmonary effort is normal. No accessory muscle usage or respiratory distress.     Breath sounds: Normal breath sounds. No wheezing or rales.  Abdominal:     General: Bowel sounds are normal.     Palpations: Abdomen is soft. There is no hepatomegaly or splenomegaly.  Musculoskeletal:     Cervical back: Normal range of motion and neck supple.     Right lower leg: No edema.     Left lower leg: No edema.  Skin:    General: Skin is warm and dry.     Capillary Refill: Capillary refill takes less than 2 seconds.  Neurological:     General: No focal deficit present.     Mental Status: She is alert and oriented to person, place, and time. Mental status is at baseline.     Deep Tendon Reflexes: Reflexes are normal and symmetric.     Reflex Scores:      Brachioradialis reflexes are 2+ on the right side and 2+ on the left side.      Patellar reflexes are 2+ on the right side and 2+ on the left side. Psychiatric:        Attention and Perception: Attention normal.        Mood and Affect: Mood normal.        Speech: Speech normal.        Behavior: Behavior normal. Behavior is cooperative.        Thought Content: Thought content normal.        Judgment: Judgment normal.   Results for orders placed or performed in visit on 07/24/22  ECHOCARDIOGRAM COMPLETE  Result Value Ref Range   AR max vel 1.74 cm2   AV Peak  grad 7.8 mmHg   Ao pk vel 1.40 m/s   S' Lateral 2.80 cm   Area-P 1/2 2.52 cm2   AV Area VTI 1.87 cm2   AV Mean grad 5.0 mmHg   Single Plane A4C EF 53.1 %   Single Plane A2C EF 54.1 %   Calc EF 53.8 %   AV Area mean vel 1.67 cm2      Assessment & Plan:   Problem List Items Addressed This Visit      Cardiovascular and Mediastinum   Hypertension   Chronic heart failure with preserved ejection fraction ([place]) - Primary     Digestive   Pancolitis ([place])  Endocrine   Poorly controlled type 2 diabetes mellitus ([place])     Other   Vitamin D deficiency   B12 deficiency   Iron deficiency anemia   Memory change     Follow up plan: No follow-ups on file.

## 2022-08-01 ENCOUNTER — Ambulatory Visit: Payer: BC Managed Care – PPO | Admitting: Nurse Practitioner

## 2022-08-01 DIAGNOSIS — D509 Iron deficiency anemia, unspecified: Secondary | ICD-10-CM

## 2022-08-01 DIAGNOSIS — E1165 Type 2 diabetes mellitus with hyperglycemia: Secondary | ICD-10-CM

## 2022-08-01 DIAGNOSIS — E538 Deficiency of other specified B group vitamins: Secondary | ICD-10-CM

## 2022-08-01 DIAGNOSIS — I10 Essential (primary) hypertension: Secondary | ICD-10-CM

## 2022-08-01 DIAGNOSIS — E559 Vitamin D deficiency, unspecified: Secondary | ICD-10-CM

## 2022-08-01 DIAGNOSIS — I5032 Chronic diastolic (congestive) heart failure: Secondary | ICD-10-CM

## 2022-08-01 DIAGNOSIS — K5100 Ulcerative (chronic) pancolitis without complications: Secondary | ICD-10-CM

## 2022-08-01 DIAGNOSIS — R413 Other amnesia: Secondary | ICD-10-CM

## 2022-08-02 ENCOUNTER — Ambulatory Visit: Payer: BC Managed Care – PPO

## 2022-08-02 ENCOUNTER — Ambulatory Visit: Payer: BC Managed Care – PPO | Admitting: Physician Assistant

## 2022-08-02 ENCOUNTER — Encounter: Payer: Self-pay | Admitting: Physician Assistant

## 2022-08-02 VITALS — BP 110/75 | HR 103 | Temp 97.3°F | Ht 60.0 in | Wt 131.1 lb

## 2022-08-02 DIAGNOSIS — E1165 Type 2 diabetes mellitus with hyperglycemia: Secondary | ICD-10-CM

## 2022-08-02 DIAGNOSIS — E538 Deficiency of other specified B group vitamins: Secondary | ICD-10-CM

## 2022-08-02 DIAGNOSIS — L03113 Cellulitis of right upper limb: Secondary | ICD-10-CM

## 2022-08-02 DIAGNOSIS — E559 Vitamin D deficiency, unspecified: Secondary | ICD-10-CM | POA: Diagnosis not present

## 2022-08-02 NOTE — Progress Notes (Signed)
Established Patient Office Visit  Name: Amy Hansen   MRN: [id number]    DOB: 11-14-1955   Date:08/06/2022  Today's Provider: Talitha Givens, MHS, PA-C Introduced myself to the patient as a PA-C and provided education on APPs in clinical practice.         Subjective  Chief Complaint  Chief Complaint  Patient presents with   Diabetes   Arm Swelling    Right fore arm to elbow, has been there for a week, went to UC last Saturday, was given ABX, dx'd Cellulitis.     Diabetes    Diabetes, Type 2 - Last A1c 8.7 on 05/01/22 - Medications: Metformin 1000 mg PO BID, Mounjaro 5 mg weekly injection  - Compliance: Good  - Checking BG at home: She reports she has lost her glucometer but is not sure when. She reports she bought this out of pocket  She reports that her glucose levels were good before she lost meter- in 90s-100s  - Diet: She is not following any particular diet to assist with her DM- states she does not have much of an appetite  She reports she has discussed this with her PCP  - Exercise: She is not engaged in regular exercise  - Eye exam: UTD - Foot exam: UTD  - Microalbumin: Completed  - Statin: Yes - rosuvastatin 5 mg po QD  - PNA vaccine: Completed  - Denies symptoms of hypoglycemia, polyuria, polydipsia, numbness extremities, foot ulcers/trauma  Right forearm injury Was treated at [facility] (07/28/22) for cellulitis with Keflex 500 mg PO QID  She states she has been taking Keflex and reports the redness and swelling seem to be improving She reports intense itching and pain if she scratches it  She is wondering if she can have a topical ointment or cream to assist with itching  She reports she has expelled a small amount of pus from the opening - reports it was white in appearance  She has tried to cover it with bandages and tape to cover the area but has noticed this has caused further skin irritation, burning and itching so she has stopped doing  this  She reports continued concerns for confusion, remembering what day it is, finding words, and instability on her feet She has previous hx of TIAs per chart review    Patient Active Problem List   Diagnosis Date Noted   Cellulitis of right upper extremity 08/06/2022   Confusion 07/18/2022   Nausea 06/20/2022   History of TIA (transient ischemic attack) 05/01/2022   Hypertriglyceridemia 04/30/2022   Memory change 01/18/2022   Primary osteoarthritis of right knee 08/27/2021   Protein-calorie malnutrition, severe 02/09/2021   Pancolitis ([place]) 04/25/2020   Major depression, chronic 06/01/2018   OCD (obsessive compulsive disorder) 06/01/2018   Chronic heart failure with preserved ejection fraction ([place]) 05/16/2018   GERD (gastroesophageal reflux disease) 02/18/2017   Iron deficiency anemia 10/18/2016   Hypertension 03/21/2016   Vitamin D deficiency 02/21/2016   B12 deficiency 02/21/2016   Poorly controlled type 2 diabetes mellitus ([place]) 09/13/2015   Migraines 05/23/2015   Restless legs syndrome (RLS) 05/23/2015   Recurrent UTI 04/26/2015   Atrophic vaginitis 04/26/2015    Past Surgical History:  Procedure Laterality Date   bariatric bypass  2012   BIOPSY  05/03/2020   Procedure: BIOPSY;  Surgeon: Milus Banister, MD;  Location: [facility] ENDOSCOPY;  Service: Endoscopy;;   CARPAL TUNNEL RELEASE Right 2003  CARPAL TUNNEL RELEASE Right    2008   CHOLECYSTECTOMY  1975   COLONOSCOPY     COLONOSCOPY WITH PROPOFOL N/A 05/03/2020   Procedure: COLONOSCOPY WITH PROPOFOL;  Surgeon: Milus Banister, MD;  Location: [facility] ENDOSCOPY;  Service: Endoscopy;  Laterality: N/A;   CYSTOSCOPY W/ RETROGRADES Bilateral 06/06/2015   Procedure: CYSTOSCOPY WITH RETROGRADE PYELOGRAM;  Surgeon: Festus Aloe, MD;  Location: [facility] ORS;  Service: Urology;  Laterality: Bilateral;   EYE SURGERY Left 07/06/2022   FL INJ LEFT KNEE CT ARTHROGRAM ([facility] HX) Left    1995   GASTRIC BYPASS  2010   HAND SURGERY  Left 01/19/2021   Thumb   HEMORRHOID SURGERY  2013   KNEE ARTHROSCOPY Left 1996   TONSILLECTOMY     TOTAL ABDOMINAL HYSTERECTOMY W/ BILATERAL SALPINGOOPHORECTOMY      Family History  Problem Relation Age of Onset   Colon cancer Mother    Stroke Father    Heart failure Sister    Bladder Cancer Neg Hx    Kidney disease Neg Hx    Prostate cancer Neg Hx    Kidney cancer Neg Hx    Pancreatic cancer Neg Hx    Esophageal cancer Neg Hx    Stomach cancer Neg Hx    Rectal cancer Neg Hx    Breast cancer Neg Hx     Social History   Tobacco Use   Smoking status: Former    Packs/day: 0.50    Types: Cigarettes    Quit date: 04/25/1975    Years since quitting: 47.3   Smokeless tobacco: Never   Tobacco comments:    quit 40 years  Substance Use Topics   Alcohol use: No    Alcohol/week: 0.0 standard drinks of alcohol     Current Outpatient Medications:    ALPRAZolam (XANAX) 1 MG tablet, TAKE 1 TABLET BY MOUTH THREE TIMES DAILY AS NEEDED FOR ANXIETY, Disp: 90 tablet, Rfl: 0   buPROPion (WELLBUTRIN XL) 150 MG 24 hr tablet, Take 3 tablets (450 mg total) by mouth daily., Disp: 270 tablet, Rfl: 3   furosemide (LASIX) 20 MG tablet, Take 1 tablet (20 mg total) by mouth daily. May take an additional tablet (63m) AS NEEDED for weight gain and/or swelling., Disp: 90 tablet, Rfl: 3   gabapentin (NEURONTIN) 100 MG capsule, Take 200 mg by mouth 2 (two) times daily., Disp: , Rfl:    lamoTRIgine (LAMICTAL) 200 MG tablet, Take 2 tablets (400 mg total) by mouth at bedtime., Disp: 60 tablet, Rfl: 11   metFORMIN (GLUCOPHAGE) 1000 MG tablet, Take 1 tablet by mouth twice daily., Disp: 180 tablet, Rfl: 1   nitrofurantoin, macrocrystal-monohydrate, (MACROBID) 100 MG capsule, Take 1 capsule (100 mg total) by mouth 2 (two) times daily., Disp: 10 capsule, Rfl: 0   omeprazole (PRILOSEC) 40 MG capsule, Omeprazole 40 mg once daily for 6 weeks, Disp: 30 capsule, Rfl: 1   ondansetron (ZOFRAN-ODT) 8 MG disintegrating  tablet, Take 1 tablet (8 mg total) by mouth every 8 (eight) hours as needed for nausea or vomiting., Disp: 60 tablet, Rfl: 1   promethazine (PHENERGAN) 25 MG tablet, Take 1 tablet (25 mg total) by mouth every 8 (eight) hours as needed for nausea or vomiting., Disp: 20 tablet, Rfl: 0   QUEtiapine (SEROQUEL) 300 MG tablet, TAKE 1 TABLET BY MOUTH AT BEDTIME, Disp: 30 tablet, Rfl: 0   Rimegepant Sulfate (NURTEC) 75 MG TBDP, Take 1 tablet by mouth daily as needed., Disp: 10 tablet, Rfl: 0  rOPINIRole (REQUIP) 1 MG tablet, Take 1 tablet (1 mg total) by mouth 2 (two) times daily., Disp: 180 tablet, Rfl: 1   rosuvastatin (CRESTOR) 5 MG tablet, Take 1 tablet (5 mg total) by mouth daily., Disp: 90 tablet, Rfl: 1   sertraline (ZOLOFT) 100 MG tablet, Take 2 tablets by mouth once daily (Patient taking differently: Take 100 mg by mouth daily.), Disp: 60 tablet, Rfl: 3   tirzepatide (MOUNJARO) 5 MG/0.5ML Pen, Inject 5 mg into the skin once a week., Disp: 6 mL, Rfl: 1   vedolizumab (ENTYVIO) 300 MG injection, Inject into the vein every 8 (eight) weeks., Disp: , Rfl:   Allergies  Allergen Reactions   Avelox [Moxifloxacin Hcl In Nacl] Anaphylaxis   Bactrim [Sulfamethoxazole-Trimethoprim] Anaphylaxis   Ciprofloxacin Other (See Comments)    Pt states she was told never to take this as it is in the same family as Avelox.    Neomycin-Polymyxin-Dexameth Other (See Comments)    Burning in the eyes   Depakote [Divalproex Sodium] Other (See Comments)    Unknown Reaction   Imitrex [Sumatriptan] Other (See Comments)    Neck and shoulder pain   Stadol [Butorphanol] Rash    I personally reviewed active problem list, medication list, allergies, health maintenance, notes from last encounter, lab results with the patient/caregiver today.   Review of Systems  Skin:        Redness with open laceration to left forearm       Objective  Vitals:   08/02/22 1419  BP: 110/75  Pulse: (!) 103  Temp: (!) 97.3 F (36.3  C)  TempSrc: Oral  SpO2: 96%  Weight: 131 lb 1.6 oz (59.5 kg)  Height: 5' (1.524 m)    Body mass index is 25.6 kg/m.  Physical Exam Vitals reviewed.  Constitutional:      General: She is awake.     Appearance: Normal appearance. She is well-developed and well-groomed.  HENT:     Head: Normocephalic and atraumatic.  Musculoskeletal:     Cervical back: Normal range of motion and neck supple.  Skin:    General: Skin is warm and dry.     Findings: Erythema and laceration present.     Comments: Cellulitis of right forearm : see photos attached below   Neurological:     Mental Status: She is alert.  Psychiatric:        Attention and Perception: Attention and perception normal.        Mood and Affect: Affect is tearful.        Behavior: Behavior is cooperative.     Media Information   Document Information  Photos  Left forearm cellulitis  08/02/2022 15:08  Attached To:  Office Visit on 08/02/22 with Tarik Teixeira, Dani Gobble, PA-C  Source Information  Kelsey Edman, [place]  [facility]    Media Information   Document Information  Photos    08/02/2022 15:09  Attached To:  Office Visit on 08/02/22 with Ernesto Zukowski, Dani Gobble, PA-C  Source Information  [place], [place]  [facility]     Media Information   Document Information  Photos    08/02/2022 15:09  Attached To:  Office Visit on 08/02/22 with Zeniyah Peaster, Dani Gobble, PA-C  Source Information  Eldred Lievanos, Dani Gobble, PA-C  [facility]    Recent Results (from the past 2160 hour(s))  Glucose Hemocue Waived (STAT)     Status: Abnormal   Collection Time: 06/01/22 10:45 AM  Result Value  Ref Range   Glu Hemocue Waived 105 (H) 70 - 99 mg/dL  Microalbumin, Urine Waived     Status: Abnormal   Collection Time: 06/01/22 10:58 AM  Result Value Ref Range   Microalb, Ur Waived 10 0 - 19 mg/L   Creatinine, Urine Waived 50 10 - 300 mg/dL   Microalb/Creat Ratio 30-300 (H) <30 mg/g    Comment:                               Abnormal:       30 - 300                         High Abnormal:           >300   IBC + Ferritin     Status: Abnormal   Collection Time: 07/05/22  4:26 PM  Result Value Ref Range   Iron 27 (L) 42 - 145 ug/dL   Transferrin 309.0 212.0 - 360.0 mg/dL   Saturation Ratios 6.2 (L) 20.0 - 50.0 %   Ferritin 5.7 (L) 10.0 - 291.0 ng/mL   TIBC 432.6 250.0 - 450.0 mcg/dL  CBC with Differential/Platelet     Status: Abnormal   Collection Time: 07/05/22  4:26 PM  Result Value Ref Range   WBC 4.4 4.0 - 10.5 K/uL   RBC 4.21 3.87 - 5.11 Mil/uL   Hemoglobin 10.6 (L) 12.0 - 15.0 g/dL   HCT 33.1 (L) 36.0 - 46.0 %   MCV 78.6 78.0 - 100.0 fl   MCHC 31.9 30.0 - 36.0 g/dL   RDW 17.8 (H) 11.5 - 15.5 %   Platelets 392.0 150.0 - 400.0 K/uL   Neutrophils Relative % 52.5 43.0 - 77.0 %   Lymphocytes Relative 39.4 12.0 - 46.0 %   Monocytes Relative 7.1 3.0 - 12.0 %   Eosinophils Relative 0.7 0.0 - 5.0 %   Basophils Relative 0.3 0.0 - 3.0 %   Neutro Abs 2.3 1.4 - 7.7 K/uL   Lymphs Abs 1.8 0.7 - 4.0 K/uL   Monocytes Absolute 0.3 0.1 - 1.0 K/uL   Eosinophils Absolute 0.0 0.0 - 0.7 K/uL   Basophils Absolute 0.0 0.0 - 0.1 K/uL  Urinalysis, Routine w reflex microscopic     Status: Abnormal   Collection Time: 07/19/22  8:00 AM  Result Value Ref Range   Specific Gravity, UA 1.015 1.005 - 1.030   pH, UA 5.0 5.0 - 7.5   Color, UA Yellow Yellow   Appearance Ur Turbid (A) Clear   Leukocytes,UA 1+ (A) Negative   Protein,UA Negative Negative/Trace   Glucose, UA Negative Negative   Ketones, UA Negative Negative   RBC, UA Negative Negative   Bilirubin, UA Negative Negative   Urobilinogen, Ur 0.2 0.2 - 1.0 mg/dL   Nitrite, UA Positive (A) Negative   Microscopic Examination See below:   Microscopic Examination     Status: Abnormal   Collection Time: 07/19/22  8:00 AM   Urine  Result Value Ref Range   WBC, UA 6-10 (A) 0 - 5 /hpf   RBC, Urine 11-30 (A) 0 - 2 /hpf   Epithelial Cells (non renal) 0-10 0 - 10  /hpf   Bacteria, UA Many (A) None seen/Few  Urine Culture     Status: Abnormal   Collection Time: 07/19/22 12:04 PM   Specimen: Urine   UR  Result Value Ref Range  Urine Culture, Routine Final report (A)    Organism ID, Bacteria Escherichia coli (A)     Comment: Cefazolin <=4 ug/mL Cefazolin with an MIC <=16 predicts susceptibility to the oral agents cefaclor, cefdinir, cefpodoxime, cefprozil, cefuroxime, cephalexin, and loracarbef when used for therapy of uncomplicated urinary tract infections due to E. coli, Klebsiella pneumoniae, and Proteus mirabilis. Greater than 100,000 colony forming units per mL    Antimicrobial Susceptibility Comment     Comment:       ** S = Susceptible; I = Intermediate; R = Resistant **                    P = Positive; N = Negative             MICS are expressed in micrograms per mL    Antibiotic                 RSLT#1    RSLT#2    RSLT#3    RSLT#4 Amoxicillin/Clavulanic Acid    I Ampicillin                     R Cefepime                       S Ceftriaxone                    S Cefuroxime                     I Ciprofloxacin                  S Ertapenem                      S Gentamicin                     S Imipenem                       S Levofloxacin                   S Meropenem                      S Nitrofurantoin                 S Piperacillin/Tazobactam        S Tetracycline                   S Tobramycin                     S Trimethoprim/Sulfa             S   ECHOCARDIOGRAM COMPLETE     Status: None   Collection Time: 07/24/22 10:58 AM  Result Value Ref Range   AR max vel 1.74 cm2   AV Peak grad 7.8 mmHg   Ao pk vel 1.40 m/s   S' Lateral 2.80 cm   Area-P 1/2 2.52 cm2   AV Area VTI 1.87 cm2   AV Mean grad 5.0 mmHg   Single Plane A4C EF 53.1 %   Single Plane A2C EF 54.1 %   Calc EF 53.8 %   AV Area mean vel 1.67 cm2  Comp Met (CMET)     Status: Abnormal   Collection Time: 08/02/22  3:18 PM  Result Value Ref Range   Glucose  151 (H) 70 - 99 mg/dL   BUN 12 8 - 27 mg/dL   Creatinine, Ser 1.30 (H) 0.57 - 1.00 mg/dL   eGFR 46 (L) >59 mL/min/1.73   BUN/Creatinine Ratio 9 (L) 12 - 28   Sodium 136 134 - 144 mmol/L   Potassium 3.8 3.5 - 5.2 mmol/L   Chloride 100 96 - 106 mmol/L   CO2 22 20 - 29 mmol/L   Calcium 8.7 8.7 - 10.3 mg/dL   Total Protein 5.8 (L) 6.0 - 8.5 g/dL   Albumin 3.3 (L) 3.9 - 4.9 g/dL   Globulin, Total 2.5 1.5 - 4.5 g/dL   Albumin/Globulin Ratio 1.3 1.2 - 2.2   Bilirubin Total <0.2 0.0 - 1.2 mg/dL   Alkaline Phosphatase 150 (H) 44 - 121 IU/L   AST 72 (H) 0 - 40 IU/L   ALT 70 (H) 0 - 32 IU/L  HgB A1c     Status: Abnormal   Collection Time: 08/02/22  3:18 PM  Result Value Ref Range   Hgb A1c MFr Bld 6.1 (H) 4.8 - 5.6 %    Comment:          Prediabetes: 5.7 - 6.4          Diabetes: >6.4          Glycemic control for adults with diabetes: <7.0    Est. average glucose Bld gHb Est-mCnc 128 mg/dL  CBC w/Diff     Status: Abnormal   Collection Time: 08/02/22  3:18 PM  Result Value Ref Range   WBC 4.5 3.4 - 10.8 x10E3/uL   RBC 4.23 3.77 - 5.28 x10E6/uL   Hemoglobin 11.5 11.1 - 15.9 g/dL   Hematocrit 35.4 34.0 - 46.6 %   MCV 84 79 - 97 fL   MCH 27.2 26.6 - 33.0 pg   MCHC 32.5 31.5 - 35.7 g/dL   RDW 18.3 (H) 11.7 - 15.4 %   Platelets 296 150 - 450 x10E3/uL   Neutrophils 59 Not Estab. %   Lymphs 34 Not Estab. %   Monocytes 6 Not Estab. %   Eos 1 Not Estab. %   Basos 0 Not Estab. %   Neutrophils Absolute 2.6 1.4 - 7.0 x10E3/uL   Lymphocytes Absolute 1.5 0.7 - 3.1 x10E3/uL   Monocytes Absolute 0.3 0.1 - 0.9 x10E3/uL   EOS (ABSOLUTE) 0.1 0.0 - 0.4 x10E3/uL   Basophils Absolute 0.0 0.0 - 0.2 x10E3/uL   Immature Granulocytes 0 Not Estab. %   Immature Grans (Abs) 0.0 0.0 - 0.1 x10E3/uL  B12     Status: None   Collection Time: 08/02/22  3:18 PM  Result Value Ref Range   Vitamin B-12 520 232 - 1,245 pg/mL  Vitamin D (25 hydroxy)     Status: Abnormal   Collection Time: 08/02/22  3:18 PM   Result Value Ref Range   Vit D, 25-Hydroxy 29.3 (L) 30.0 - 100.0 ng/mL    Comment: Vitamin D deficiency has been defined by the [place] and an Endocrine Society practice guideline as a level of serum 25-OH vitamin D less than 20 ng/mL (1,2). The Endocrine Society went on to further define vitamin D insufficiency as a level between 21 and 29 ng/mL (2). 1. IOM (Institute of Medicine). 2010. Dietary reference    intakes for calcium and D. [place]: The    [employer]. 2. Holick MF, Binkley [place], Bischoff-Ferrari HA, et al.  Evaluation, treatment, and prevention of vitamin D    deficiency: an Endocrine Society clinical practice    guideline. JCEM. 2011 Jul; 96(7):1911-30.      PHQ2/9:    07/18/2022    8:48 AM 01/18/2022    3:24 PM 08/01/2021    9:34 AM 02/20/2021    3:02 PM 10/27/2020    8:12 AM  Depression screen PHQ 2/9  Decreased Interest 1 2 0 3 0  Down, Depressed, Hopeless 1 2 0 1 0  PHQ - 2 Score 2 4 0 4 0  Altered sleeping _0 Tired, decreased energy _1 Change in appetite _2 Feeling bad or failure about yourself  0 3 0 0 0  Trouble concentrating _3 0  Moving slowly or fidgety/restless _4 0  Suicidal thoughts 0 1 0 0 0  PHQ-9 Score _5 Difficult doing work/chores Somewhat difficult Extremely dIfficult Not difficult at all Somewhat difficult Not difficult at all      Fall Risk:    07/18/2022    8:48 AM 05/11/2022    1:35 PM 01/18/2022    3:24 PM 08/01/2021    9:34 AM 12/14/2019   11:38 AM  Fall Risk   Falls in the past year? _6 0  Number falls in past yr: 1 0 1 1 0  Injury with Fall? 0 0 1 1 0  Risk for fall due to : History of fall(s) History of fall(s) History of fall(s) Medication side effect;Impaired balance/gait   Follow up Falls evaluation completed Falls evaluation completed Falls evaluation completed Falls evaluation completed       Functional Status Survey:       Assessment & Plan  Problem List Items Addressed This Visit       Endocrine   Poorly controlled type 2 diabetes mellitus ([place]) - Primary    Chronic, appears to be improving after reviewing labs A1c today was 6.1  She is taking Metformin 1000 mg PO BID and Mounjaro 5 mg weekly injection and reports she is satisfied with treatment Recommend she continue current management plan but follow up with her PCP to discuss reduced appetite and elevated liver enzymes as this could be from her injectable Follow up at earliest convenience to discuss        Relevant Orders   Comp Met (CMET) (Completed)   HgB A1c (Completed)     Other   Vitamin D deficiency   Relevant Orders   Vitamin D (25 hydroxy) (Completed)   B12 deficiency   Relevant Orders   CBC w/Diff (Completed)   B12 (Completed)   Cellulitis of right upper extremity    Acute, ongoing She is finishing a script for Keflex for cellulitis following a scratch to her right forearm from her dog Skin appears irritated, dry and is tender to palpation She admits to using bandaids and tape to try to apply a covering to the area which may account for some of the redness and irritation Recommend follow up in about a week to assess for resolution and recommend no further topicals or tapes/ adhesives other than Aquaphor or Vaseline to assist with itching and dryness May need to add Bactrim to regimen if not resolving at follow up - CBC is overall normal- no elevated WBCs to indicate more severe infection Follow up in about a week to ensure ongoing improvement  No follow-ups on file.   I, Javonne Louissaint E Amulya Quintin, PA-C, have reviewed all documentation for this visit. The documentation on 08/06/22 for the exam, diagnosis, procedures, and orders are all accurate and complete.   Talitha Givens, MHS, PA-C [facility]

## 2022-08-02 NOTE — Patient Instructions (Addendum)
For your arm I recommend the following: Please continue to take the Keflex and finish the full course of the antibiotic to make sure the infection is fully treated.   You can use Aquaphor or Vaseline to keep the skin hydrated and help with the itching   We will keep you updated on the results of your labs

## 2022-08-03 LAB — CBC WITH DIFFERENTIAL/PLATELET
Basophils Absolute: 0 10*3/uL (ref 0.0–0.2)
Basos: 0 %
EOS (ABSOLUTE): 0.1 10*3/uL (ref 0.0–0.4)
Eos: 1 %
Hematocrit: 35.4 % (ref 34.0–46.6)
Hemoglobin: 11.5 g/dL (ref 11.1–15.9)
Immature Grans (Abs): 0 10*3/uL (ref 0.0–0.1)
Immature Granulocytes: 0 %
Lymphocytes Absolute: 1.5 10*3/uL (ref 0.7–3.1)
Lymphs: 34 %
MCH: 27.2 pg (ref 26.6–33.0)
MCHC: 32.5 g/dL (ref 31.5–35.7)
MCV: 84 fL (ref 79–97)
Monocytes Absolute: 0.3 10*3/uL (ref 0.1–0.9)
Monocytes: 6 %
Neutrophils Absolute: 2.6 10*3/uL (ref 1.4–7.0)
Neutrophils: 59 %
Platelets: 296 10*3/uL (ref 150–450)
RBC: 4.23 x10E6/uL (ref 3.77–5.28)
RDW: 18.3 % — ABNORMAL HIGH (ref 11.7–15.4)
WBC: 4.5 10*3/uL (ref 3.4–10.8)

## 2022-08-03 LAB — VITAMIN D 25 HYDROXY (VIT D DEFICIENCY, FRACTURES): Vit D, 25-Hydroxy: 29.3 ng/mL — ABNORMAL LOW (ref 30.0–100.0)

## 2022-08-03 LAB — COMPREHENSIVE METABOLIC PANEL
ALT: 70 IU/L — ABNORMAL HIGH (ref 0–32)
AST: 72 IU/L — ABNORMAL HIGH (ref 0–40)
Albumin/Globulin Ratio: 1.3 (ref 1.2–2.2)
Albumin: 3.3 g/dL — ABNORMAL LOW (ref 3.9–4.9)
Alkaline Phosphatase: 150 IU/L — ABNORMAL HIGH (ref 44–121)
BUN/Creatinine Ratio: 9 — ABNORMAL LOW (ref 12–28)
BUN: 12 mg/dL (ref 8–27)
Bilirubin Total: <0.2 mg/dL (ref 0.0–1.2)
CO2: 22 mmol/L (ref 20–29)
Calcium: 8.7 mg/dL (ref 8.7–10.3)
Chloride: 100 mmol/L (ref 96–106)
Creatinine, Ser: 1.3 mg/dL — ABNORMAL HIGH (ref 0.57–1.00)
Globulin, Total: 2.5 g/dL (ref 1.5–4.5)
Glucose: 151 mg/dL — ABNORMAL HIGH (ref 70–99)
Potassium: 3.8 mmol/L (ref 3.5–5.2)
Sodium: 136 mmol/L (ref 134–144)
Total Protein: 5.8 g/dL — ABNORMAL LOW (ref 6.0–8.5)
eGFR: 46 mL/min/{1.73_m2} — ABNORMAL LOW (ref >59)

## 2022-08-03 LAB — HEMOGLOBIN A1C
Est. average glucose Bld gHb Est-mCnc: 128 mg/dL
Hgb A1c MFr Bld: 6.1 % — ABNORMAL HIGH (ref 4.8–5.6)

## 2022-08-03 LAB — VITAMIN B12: Vitamin B-12: 520 pg/mL (ref 232–1245)

## 2022-08-06 ENCOUNTER — Encounter: Payer: Self-pay | Admitting: Nurse Practitioner

## 2022-08-06 DIAGNOSIS — L03113 Cellulitis of right upper limb: Secondary | ICD-10-CM | POA: Insufficient documentation

## 2022-08-06 NOTE — Assessment & Plan Note (Signed)
Chronic, appears to be improving after reviewing labs A1c today was 6.1  She is taking Metformin 1000 mg PO BID and Mounjaro 5 mg weekly injection and reports she is satisfied with treatment Recommend she continue current management plan but follow up with her PCP to discuss reduced appetite and elevated liver enzymes as this could be from her injectable Follow up at earliest convenience to discuss

## 2022-08-06 NOTE — Assessment & Plan Note (Signed)
Acute, ongoing She is finishing a script for Keflex for cellulitis following a scratch to her right forearm from her dog Skin appears irritated, dry and is tender to palpation She admits to using bandaids and tape to try to apply a covering to the area which may account for some of the redness and irritation Recommend follow up in about a week to assess for resolution and recommend no further topicals or tapes/ adhesives other than Aquaphor or Vaseline to assist with itching and dryness May need to add Bactrim to regimen if not resolving at follow up - CBC is overall normal- no elevated WBCs to indicate more severe infection Follow up in about a week to ensure ongoing improvement

## 2022-08-06 NOTE — Assessment & Plan Note (Signed)
>>  ASSESSMENT AND PLAN FOR DIABETES MELLITUS TYPE 2 IN NONOBESE ([facility]) WRITTEN ON 08/06/2022  8:43 AM BY MECUM, ERIN E, PA-C  Chronic, appears to be improving after reviewing labs A1c today was 6.1  She is taking Metformin 1000 mg PO BID and Mounjaro 5 mg weekly injection and reports she is satisfied with treatment Recommend she continue current management plan but follow up with her PCP to discuss reduced appetite and elevated liver enzymes as this could be from her injectable Follow up at earliest convenience to discuss

## 2022-08-07 DIAGNOSIS — L03113 Cellulitis of right upper limb: Secondary | ICD-10-CM | POA: Diagnosis not present

## 2022-08-07 DIAGNOSIS — Z8639 Personal history of other endocrine, nutritional and metabolic disease: Secondary | ICD-10-CM | POA: Diagnosis not present

## 2022-08-09 ENCOUNTER — Ambulatory Visit (INDEPENDENT_AMBULATORY_CARE_PROVIDER_SITE_OTHER): Payer: BC Managed Care – PPO

## 2022-08-09 ENCOUNTER — Ambulatory Visit: Payer: BC Managed Care – PPO | Admitting: Physician Assistant

## 2022-08-09 ENCOUNTER — Telehealth: Payer: Self-pay | Admitting: Nurse Practitioner

## 2022-08-09 ENCOUNTER — Encounter: Payer: Self-pay | Admitting: Physician Assistant

## 2022-08-09 VITALS — BP 107/70 | HR 94 | Temp 97.8°F | Resp 20 | Ht 60.0 in | Wt 130.2 lb

## 2022-08-09 VITALS — BP 108/67 | HR 109 | Temp 97.6°F | Wt 130.9 lb

## 2022-08-09 DIAGNOSIS — L03113 Cellulitis of right upper limb: Secondary | ICD-10-CM | POA: Diagnosis not present

## 2022-08-09 DIAGNOSIS — K5100 Ulcerative (chronic) pancolitis without complications: Secondary | ICD-10-CM | POA: Diagnosis not present

## 2022-08-09 MED ORDER — VEDOLIZUMAB 300 MG IV SOLR
300.0000 mg | Freq: Once | INTRAVENOUS | Status: AC
  Administered 2022-08-09: 300 mg via INTRAVENOUS
  Filled 2022-08-09: qty 5

## 2022-08-09 NOTE — Telephone Encounter (Signed)
Copied from [place] [id number]. Topic: General - Inquiry >> Aug 08, 2022  5:05 PM Leilani Able wrote: [facility] MRI dept, Jinny Blossom calling / has sent chat to Santiago Glad re pt. Pt has called her, thinking she may  sdbe  sched soon? Wants PCP opinion call Jinny Blossom [phone] will be back in am

## 2022-08-09 NOTE — Progress Notes (Signed)
Diagnosis: Crohn's Disease  Provider:  Marshell Garfinkel MD  Procedure: Infusion  IV Type: Peripheral, IV Location: L Antecubital  Entyvio (Vedolizumab), Dose: 300 mg  Infusion Start Time: 4458  Infusion Stop Time: 0933  Post Infusion IV Care: Patient declined observation and Peripheral IV Discontinued  Discharge: Condition: Stable, Destination: Home . AVS provided to patient.   Performed by:  Binnie Kand, RN

## 2022-08-09 NOTE — Telephone Encounter (Signed)
PCP states she has responded to [facility] via secure chat this morning.

## 2022-08-09 NOTE — Progress Notes (Signed)
Acute Office Visit   Patient: Amy Hansen   DOB: [birth year]   67 y.o. Female  MRN: [id number] Visit Date: 08/09/2022  Today's healthcare provider: Dani Gobble Gertude Benito, PA-C  Introduced myself to the patient as a Journalist, newspaper and provided education on APPs in clinical practice.    Chief Complaint  Patient presents with   Cellulitis    1 week f/up- pt states her arm was getting worse so she went to urgent care on Tuesday. Started on Doxycycline, Cefdinir, and Mupirocin ointment.    Subjective    HPI HPI     Cellulitis    Additional comments: 1 week f/up- pt states her arm was getting worse so she went to urgent care on Tuesday. Started on Doxycycline, Cefdinir, and Mupirocin ointment.       Last edited by Georgina Peer, CMA on 08/09/2022  1:41 PM.      Follow up for cellulitis  She was seen in UC on 08/07/22 and was given Doxycycline and Omnicef as well and Mupirocin  She reports the itching has improved since starting the new medications She denies drainage, increased warmth to area or streaking   She reports concerns for scheduling her upcoming MRI  She has had iron infusions recently so will have to wait to avoid interference     Medications: Outpatient Medications Prior to Visit  Medication Sig   ALPRAZolam (XANAX) 1 MG tablet TAKE 1 TABLET BY MOUTH THREE TIMES DAILY AS NEEDED FOR ANXIETY   buPROPion (WELLBUTRIN XL) 150 MG 24 hr tablet Take 3 tablets (450 mg total) by mouth daily.   cefdinir (OMNICEF) 300 MG capsule Take 300 mg by mouth 2 (two) times daily.   doxycycline (VIBRAMYCIN) 100 MG capsule Take 100 mg by mouth 2 (two) times daily.   furosemide (LASIX) 20 MG tablet Take 1 tablet (20 mg total) by mouth daily. May take an additional tablet ('20mg'$ ) AS NEEDED for weight gain and/or swelling.   gabapentin (NEURONTIN) 100 MG capsule Take 200 mg by mouth 2 (two) times daily.   lamoTRIgine (LAMICTAL) 200 MG tablet Take 2 tablets (400 mg total) by mouth at bedtime.    metFORMIN (GLUCOPHAGE) 1000 MG tablet Take 1 tablet by mouth twice daily.   mupirocin ointment (BACTROBAN) 2 % Apply 1 Application topically 2 (two) times daily.   omeprazole (PRILOSEC) 40 MG capsule Omeprazole 40 mg once daily for 6 weeks   ondansetron (ZOFRAN-ODT) 8 MG disintegrating tablet Take 1 tablet (8 mg total) by mouth every 8 (eight) hours as needed for nausea or vomiting.   promethazine (PHENERGAN) 25 MG tablet Take 1 tablet (25 mg total) by mouth every 8 (eight) hours as needed for nausea or vomiting.   QUEtiapine (SEROQUEL) 300 MG tablet TAKE 1 TABLET BY MOUTH AT BEDTIME   Rimegepant Sulfate (NURTEC) 75 MG TBDP Take 1 tablet by mouth daily as needed.   rOPINIRole (REQUIP) 1 MG tablet Take 1 tablet (1 mg total) by mouth 2 (two) times daily.   rosuvastatin (CRESTOR) 5 MG tablet Take 1 tablet (5 mg total) by mouth daily.   sertraline (ZOLOFT) 100 MG tablet Take 2 tablets by mouth once daily (Patient taking differently: Take 100 mg by mouth daily.)   tirzepatide (MOUNJARO) 5 MG/0.5ML Pen Inject 5 mg into the skin once a week.   vedolizumab (ENTYVIO) 300 MG injection Inject into the vein every 8 (eight) weeks.   [DISCONTINUED] nitrofurantoin, macrocrystal-monohydrate, (MACROBID) 100 MG  capsule Take 1 capsule (100 mg total) by mouth 2 (two) times daily.   No facility-administered medications prior to visit.    Review of Systems  Skin:  Positive for rash.       Objective    BP 108/67   Pulse (!) 109   Temp 97.6 F (36.4 C) (Oral)   Wt 130 lb 14.4 oz (59.4 kg)   LMP  (LMP Unknown)   SpO2 97%   BMI 25.56 kg/m    Physical Exam Vitals reviewed.  Constitutional:      Appearance: Normal appearance.  HENT:     Head: Normocephalic and atraumatic.  Skin:    General: Skin is warm and dry.     Findings: Rash present.          Comments: Reddened area on right forearm with macular scaling and mild papular areas consistent with previous PE  Area is not swollen, draining, or  streaking    Neurological:     Mental Status: She is alert.       No results found for any visits on 08/09/22.  Assessment & Plan      No follow-ups on file.       Problem List Items Addressed This Visit       Other   Cellulitis of right upper extremity - Primary  Acute, ongoing concern, resolving She was seen in the UC on 08/07/22 and provided with Doxycycline, Cefdinir, and Mupirocin for her cellulitis  Her right forearm seems to be improving, laceration is now fully closed and swelling has reduced Recommend she continue with her current regimen until abx are finished  Follow up as needed for persistent or progressing symptoms Recommend using OTC ointments such as Aquaphor or vaseline for skin hydration   During visit we also discussed her MRI scheduling - I spent about 3 minutes of visit answering her questions about this and relaying information from her PCP.        No follow-ups on file.   I, Blima Jaimes E Naif Alabi, PA-C, have reviewed all documentation for this visit. The documentation on 08/09/22 for the exam, diagnosis, procedures, and orders are all accurate and complete.   Talitha Givens, MHS, PA-C [facility]

## 2022-08-12 NOTE — Progress Notes (Unsigned)
Cardiology Office Note    Date:  08/13/2022   ID:  Amy Hansen, DOB 27-May-1956, MRN [id number]  PCP:  [name], NP  Cardiologist:  Nelva Bush, MD  Electrophysiologist:  None   Chief Complaint: Follow up  History of Present Illness:   Amy Hansen is a 67 y.o. female with history of HFpEF, atypical chest pain, PSVT, CVA/TIA, DM2, anemia, diverticulitis, RLS, hiatal hernia, anxiety, depression, and GERD who presents for follow-up of her echo and Zio patch.   She was previously followed by Dr. Yvone Neu with echo in 07/2016 showing an EF of 55 to 60%, no regional wall motion abnormalities, grade 1 diastolic dysfunction, mild mitral regurgitation, and a mildly dilated left atrium.  Treadmill MPI in 08/2016 showed no significant ischemia with adequate exercise tolerance, and was overall low risk.  Zio patch in 07/2019 showed a predominant rhythm of sinus with an average rate of 101 bpm, a single episode of NSVT lasting 9 beats, 20 episodes of SVT lasting up to 11 beats, and rare PACs/PVCs.  Patient triggered events corresponded to sinus rhythm with PACs.  Echo from 03/2021 demonstrated an EF of 60 to 65%, no regional wall motion abnormalities, grade 1 diastolic dysfunction, normal RV systolic function and ventricular cavity size, mild aortic valve sclerosis without evidence of stenosis, and an estimated right atrial pressure of 3 mmHg.      Prior MRI of the brain in 2022 demonstrated small chronic cortical/subcortical infarct within the left parietal lobe as well as a small chronic lacunar infarcts within the bilateral basal ganglia and changes consistent with chronic small vessel ischemia.    She was last seen in the office in 05/2022 and was without symptoms of angina or decompensation.  She did report a 1 week history of exertional shortness of breath when going up or down a flight of stairs.  She also reported an episode during the summer where she woke up on the floor after a lamp  fell on her.  At that time, CT of the head and MRI of the face showed no acute process.  It was unclear if this was associated with a syncopal episode or not.  There was no cardiac pro or postdrome.  Zio patch in 05/2022 showed a predominant rhythm of sinus with an average rate of 103 bpm (range 74 to 150 bpm in sinus), single episode of NSVT occurred lasting 4 beats with a maximum rate of 222 bpm, 11 episodes of SVT lasting up to 7 beats with a maximum rate of 148 bpm, rare PACs and PVCs, and no sustained arrhythmias or prolonged pauses.  Patient triggered events corresponded to sinus rhythm and blocked PAC.  She was seen at outside ED on 07/16/2022 with intermittent confusion and difficulty saying specific words.  CT of the head showed no acute intracranial abnormality.  High-sensitivity troponin negative.  She followed up with PCP and was found to have UTI.  They have ordered an MRI of the brain which is pending at this time.  Echo on 07/24/2022 demonstrated an EF of 50 to 55%, no regional wall motion abnormalities, mild LVH, grade 1 diastolic dysfunction, normal RV systolic function and ventricular cavity size, mildly dilated left atrium, and trivial mitral regurgitation.  She comes in doing well from a cardiac perspective and is without symptoms of angina or decompensation.  No exertional dyspnea.  She does report a sensation to frequently sigh.  No significant lower extremity swelling or progressive orthopnea.  She has  reported to further episodes of transient confusion with associated dysarthria without weakness in the upper or lower extremities.  She reports these episodes have lasted for upwards of a day in duration.  Currently with out focal neurological deficit.   Labs independently reviewed: 07/2022 - Hgb 11.5, PLT 296, A1c 6.1, BUN 12, serum creatinine 1.3, potassium 3.8, albumin 3.3, AST 72, ALT 70 06/2022 - TSH 0.236, magnesium 1.7 04/2022 - TC 145, TG 159, HDL 49, LDL 69   Past Medical  History:  Diagnosis Date   Anemia    Anxiety    Arthritis    Blood transfusion without reported diagnosis    Cataract    CHF (congestive heart failure) (HCC)    Chronic kidney disease    UTI, hematuria in urine   Colitis    Crohn's disease ([place])    Depression    Diabetes ([place])    Diverticulosis    Frequent headaches    Interstitial cystitis    Recurrent UTI    Restless leg syndrome    TIA (transient ischemic attack)    more than year ago per pt 12-26-21   TIA (transient ischemic attack) 02/20/2021   Urinary frequency     Past Surgical History:  Procedure Laterality Date   bariatric bypass  2012   BIOPSY  05/03/2020   Procedure: BIOPSY;  Surgeon: Milus Banister, MD;  Location: [place];  Service: Endoscopy;;   CARPAL TUNNEL RELEASE Right 2003   CARPAL TUNNEL RELEASE Right    2008   CHOLECYSTECTOMY  1975   COLONOSCOPY     COLONOSCOPY WITH PROPOFOL N/A 05/03/2020   Procedure: COLONOSCOPY WITH PROPOFOL;  Surgeon: Milus Banister, MD;  Location: [place];  Service: Endoscopy;  Laterality: N/A;   CYSTOSCOPY W/ RETROGRADES Bilateral 06/06/2015   Procedure: CYSTOSCOPY WITH RETROGRADE PYELOGRAM;  Surgeon: Festus Aloe, MD;  Location: [facility];  Service: Urology;  Laterality: Bilateral;   EYE SURGERY Left 07/06/2022   FL INJ LEFT KNEE CT ARTHROGRAM ([facility] HX) Left    1995   GASTRIC BYPASS  2010   HAND SURGERY Left 01/19/2021   Thumb   HEMORRHOID SURGERY  2013   KNEE ARTHROSCOPY Left 1996   TONSILLECTOMY     TOTAL ABDOMINAL HYSTERECTOMY W/ BILATERAL SALPINGOOPHORECTOMY      Current Medications: Current Meds  Medication Sig   ALPRAZolam (XANAX) 1 MG tablet TAKE 1 TABLET BY MOUTH THREE TIMES DAILY AS NEEDED FOR ANXIETY   aspirin EC 81 MG tablet Take 1 tablet (81 mg) by mouth once daily. Swallow whole.   buPROPion (WELLBUTRIN XL) 150 MG 24 hr tablet Take 3 tablets (450 mg total) by mouth daily.   cefdinir (OMNICEF) 300 MG capsule Take 300 mg by mouth 2 (two)  times daily.   doxycycline (VIBRAMYCIN) 100 MG capsule Take 100 mg by mouth 2 (two) times daily.   erythromycin ophthalmic ointment Place 1 Application into the left eye 2 (two) times daily.   gabapentin (NEURONTIN) 100 MG capsule Take 200 mg by mouth 2 (two) times daily.   lamoTRIgine (LAMICTAL) 200 MG tablet Take 2 tablets (400 mg total) by mouth at bedtime.   metFORMIN (GLUCOPHAGE) 1000 MG tablet Take 1 tablet by mouth twice daily.   mupirocin ointment (BACTROBAN) 2 % Apply 1 Application topically 2 (two) times daily.   omeprazole (PRILOSEC) 40 MG capsule Omeprazole 40 mg once daily for 6 weeks   ondansetron (ZOFRAN-ODT) 8 MG disintegrating tablet Take 1 tablet (8 mg total) by mouth  every 8 (eight) hours as needed for nausea or vomiting.   promethazine (PHENERGAN) 25 MG tablet Take 1 tablet (25 mg total) by mouth every 8 (eight) hours as needed for nausea or vomiting.   QUEtiapine (SEROQUEL) 300 MG tablet TAKE 1 TABLET BY MOUTH AT BEDTIME   Rimegepant Sulfate (NURTEC) 75 MG TBDP Take 1 tablet by mouth daily as needed.   rOPINIRole (REQUIP) 1 MG tablet Take 1 tablet (1 mg total) by mouth 2 (two) times daily.   rosuvastatin (CRESTOR) 5 MG tablet Take 1 tablet (5 mg total) by mouth daily.   sertraline (ZOLOFT) 100 MG tablet Take 2 tablets by mouth once daily (Patient taking differently: Take 100 mg by mouth daily.)   tirzepatide (MOUNJARO) 5 MG/0.5ML Pen Inject 5 mg into the skin once a week.   vedolizumab (ENTYVIO) 300 MG injection Inject into the vein. Every 4 weeks   [DISCONTINUED] furosemide (LASIX) 20 MG tablet Take 1 tablet (20 mg total) by mouth daily. May take an additional tablet ('20mg'$ ) AS NEEDED for weight gain and/or swelling.    Allergies:   Avelox [moxifloxacin hcl in nacl], Bactrim [sulfamethoxazole-trimethoprim], Ciprofloxacin, Neomycin-polymyxin-dexameth, Aquaphor [lanolin-petrolatum], Depakote [divalproex sodium], Imitrex [sumatriptan], and Stadol [butorphanol]   Social History    Socioeconomic History   Marital status: Married    Spouse name: Not on file   Number of children: Not on file   Years of education: Not on file   Highest education level: Not on file  Occupational History   Not on file  Tobacco Use   Smoking status: Former    Packs/day: 0.50    Types: Cigarettes    Quit date: 04/25/1975    Years since quitting: 47.3   Smokeless tobacco: Never   Tobacco comments:    quit 40 years  Vaping Use   Vaping Use: Never used  Substance and Sexual Activity   Alcohol use: No    Alcohol/week: 0.0 standard drinks of alcohol   Drug use: No   Sexual activity: Not Currently    Birth control/protection: Post-menopausal, Surgical  Other Topics Concern   Not on file  Social History Narrative   Caffeine 5 servings per day.   Social Determinants of Health   Financial Resource Strain: Medium Risk (04/12/2021)   Overall Financial Resource Strain (CARDIA)    Difficulty of Paying Living Expenses: Somewhat hard  Food Insecurity: No Food Insecurity (03/06/2022)   Hunger Vital Sign    Worried About Running Out of Food in the Last Year: Never true    Ran Out of Food in the Last Year: Never true  Transportation Needs: No Transportation Needs (03/06/2022)   PRAPARE - Hydrologist (Medical): No    Lack of Transportation (Non-Medical): No  Physical Activity: Inactive (04/12/2021)   Exercise Vital Sign    Days of Exercise per Week: 0 days    Minutes of Exercise per Session: 0 min  Stress: No Stress Concern Present (03/06/2022)   [place]    Feeling of Stress : Only a little  Social Connections: Moderately Integrated (03/06/2022)   Social Connection and Isolation Panel [NHANES]    Frequency of Communication with Friends and Family: More than three times a week    Frequency of Social Gatherings with Friends and Family: More than three times a week    Attends Religious Services:  More than 4 times per year    Active Member of [employer] or Organizations:  No    Attends Archivist Meetings: Never    Marital Status: Married     Family History:  The patient's family history includes Colon cancer in her mother; Heart failure in her sister; Stroke in her father. There is no history of Bladder Cancer, Kidney disease, Prostate cancer, Kidney cancer, Pancreatic cancer, Esophageal cancer, Stomach cancer, Rectal cancer, or Breast cancer.  ROS:   12-point review of systems is negative unless otherwise noted in HPI   EKGs/Labs/Other Studies Reviewed:    Studies reviewed were summarized above. The additional studies were reviewed today:  2D echo 07/24/2022: 1. Left ventricular ejection fraction, by estimation, is 50 to 55%. Left  ventricular ejection fraction by 2D MOD biplane is 53.8 %. The left  ventricle has low normal function. The left ventricle has no regional wall  motion abnormalities. There is mild left ventricular hypertrophy. Left ventricular diastolic parameters are consistent with Grade I diastolic dysfunction (impaired relaxation). The average left ventricular global longitudinal strain is -16.8 %.   2. Right ventricular systolic function is normal. The right ventricular size is normal.   3. Left atrial size was mildly dilated.   4. The mitral valve is normal in structure. Trivial mitral valve regurgitation.   5. The aortic valve is tricuspid. Aortic valve regurgitation is not visualized. __________   Elwyn Reach patch 05/2022:   The patient was monitored for 13 days, 23 hours.   The predominant rhythm was sinus with an average rate of 103 bpm (range 74-150 bpm in sinus).   There were rare PAC's and PVC's.   A single episode of nonsustained ventricular tachycardia occurred, lasting 4 beats with a maximum rate of 222 bpm.   There were 11 supraventricular runs, lasting up to 7 beats with a maximum rate of 148 bpm.   No sustained arrhythmia or prolonged pause was  observed.   Patient triggered events correspond to sinus rhythm and blocked PAC.   Predominantly sinus rhythm with elevated average heart rate.  Rare PAC's and PVC's were observed, as well as a few brief episodes of NSVT and PSVT, as detailed above. __________  2D echo 04/20/2021: 1. Left ventricular ejection fraction, by estimation, is 60 to 65%. Left  ventricular ejection fraction by 2D MOD biplane is 64.3 %. The left  ventricle has normal function. The left ventricle has no regional wall  motion abnormalities. Left ventricular  diastolic parameters are consistent with Grade I diastolic dysfunction  (impaired relaxation).   2. Right ventricular systolic function is normal. The right ventricular  size is normal.   3. The mitral valve is grossly normal. No evidence of mitral valve  regurgitation.   4. The aortic valve is tricuspid. Aortic valve regurgitation is not  visualized. Mild aortic valve sclerosis is present, with no evidence of  aortic valve stenosis.   5. The inferior vena cava is normal in size with greater than 50%  respiratory variability, suggesting right atrial pressure of 3 mmHg.   Comparison(s): LVEF 55-60%.  __________   Elwyn Reach patch 07/2019: The patient was monitored for 12 days, 14 hours. The predominant rhythm was sinus with an average rate of 101 bpm (range 66 to 150 bpm in sinus). Rare PACs and PVCs were noted. A single episode of nonsustained ventricular tachycardia lasting nine beats occurred, with a maximum rate of 152 bpm. There were 20 episodes of supraventricular tachycardia lasting up to 11 beats with a maximal rate of 176 bpm. No sustained arrhythmia or prolonged pause was observed.  Patient triggered events correspond to sinus rhythm with PACs.   Predominantly sinus rhythm with rare PACs and PVCs as well as brief PSVT and NSVT.  Patient triggered events correspond to sinus rhythm with PACs. __________   Treadmill MPI 08/30/2016: Exercise  myocardial  perfusion imaging study with no significant  ischemia Normal wall motion, EF estimated at 50% No EKG changes concerning for ischemia at peak stress or in recovery. Target heart rate achieved Adequate exercise tolerance, exercised for 5:50 min Low risk scan __________   2D echo 08/23/2016: - Left ventricle: The cavity size was normal. Wall thickness was    normal. Systolic function was normal. The estimated ejection    fraction was in the range of 55% to 60%. Wall motion was normal;    there were no regional wall motion abnormalities. Doppler    parameters are consistent with abnormal left ventricular    relaxation (grade 1 diastolic dysfunction).  - Mitral valve: There was mild regurgitation.  - Left atrium: The atrium was mildly dilated.    EKG:  EKG is ordered today.  The EKG ordered today demonstrates NSR, 99 bpm, possible prior anterior infarct versus lead placement, no acute ST-T changes  Recent Labs: 08/02/2022: ALT 70; BUN 12; Creatinine, Ser 1.30; Hemoglobin 11.5; Platelets 296; Potassium 3.8; Sodium 136  Recent Lipid Panel    Component Value Date/Time   CHOL 145 05/01/2022 0943   CHOL 139 07/17/2016 1408   CHOL 148 01/10/2012 0717   TRIG 159 (H) 05/01/2022 0943   TRIG 99 07/17/2016 1408   TRIG 110 01/10/2012 0717   HDL 49 05/01/2022 0943   HDL 47 01/10/2012 0717   CHOLHDL 3.0 05/01/2022 0943   CHOLHDL 2.0 04/26/2020 0339   VLDL 13 04/26/2020 0339   VLDL 20 07/17/2016 1408   VLDL 22 01/10/2012 0717   LDLCALC 69 05/01/2022 0943   LDLCALC 79 01/10/2012 0717    PHYSICAL EXAM:    VS:  BP 98/66 (BP Location: Left Arm, Patient Position: Sitting, Cuff Size: Normal)   Pulse 99   Ht 5' (1.524 m)   Wt 132 lb (59.9 kg)   LMP  (LMP Unknown)   SpO2 99%   BMI 25.78 kg/m   BMI: Body mass index is 25.78 kg/m.  Physical Exam Vitals reviewed.  Constitutional:      Appearance: She is well-developed.  HENT:     Head: Normocephalic and atraumatic.  Eyes:     General:         Right eye: No discharge.        Left eye: No discharge.  Neck:     Vascular: No JVD.  Cardiovascular:     Rate and Rhythm: Normal rate and regular rhythm.     Pulses:          Posterior tibial pulses are 2+ on the right side and 2+ on the left side.     Heart sounds: Normal heart sounds, S1 normal and S2 normal. Heart sounds not distant. No midsystolic click and no opening snap. No murmur heard.    No friction rub.  Pulmonary:     Effort: Pulmonary effort is normal. No respiratory distress.     Breath sounds: Normal breath sounds. No decreased breath sounds, wheezing or rales.  Chest:     Chest wall: No tenderness.  Abdominal:     General: There is no distension.  Musculoskeletal:     Cervical back: Normal range of motion.  Skin:    General:  Skin is warm and dry.     Nails: There is no clubbing.  Neurological:     Mental Status: She is alert and oriented to person, place, and time.  Psychiatric:        Speech: Speech normal.        Behavior: Behavior normal.        Thought Content: Thought content normal.        Judgment: Judgment normal.     Wt Readings from Last 3 Encounters:  08/13/22 132 lb (59.9 kg)  08/09/22 130 lb 14.4 oz (59.4 kg)  08/09/22 130 lb 3.2 oz (59.1 kg)     ASSESSMENT & PLAN:   HFpEF: She appears euvolemic and well compensated on low-dose furosemide 20 mg daily with an additional 20 mg as needed.  Weight stable.  Echo demonstrated preserved LV systolic function with grade 1 diastolic dysfunction.  PSVT: Quiescent.  Most recent outpatient cardiac monitoring showed brief paroxysms of SVT with a maximum duration lasting just 7 beats.  Labs show a suppressed TSH, unclear if this is contributing or not.  Not requiring standing AV nodal blocking medication.  History of strokes/TIAs/near syncope: Prior MRI of the brain in 2022 showed small chronic lacunar infarcts within the bilateral basal ganglia as well as small chronic cortical/subcortical infarct  within the left parietal lobe and was stable when compared to study in 2021.  Echo last month without significant structural abnormalities.  Zio patch showed no significant sustained arrhythmias, including A-fib/flutter.  She remains on aspirin 81 mg and rosuvastatin.  Awaiting repeat MRI.  Follow-up with PCP and neurology.  DM2: A1c 6.1 earlier this month.  May benefit from addition of SGLT2 inhibitor if felt indicated by PCP.  Follow-up with PCP as directed.  Abnormal TSH: Follow-up with PCP.   Disposition: F/u with Dr. Saunders Revel or an APP in 6 months.   Medication Adjustments/Labs and Tests Ordered: Current medicines are reviewed at length with the patient today.  Concerns regarding medicines are outlined above. Medication changes, Labs and Tests ordered today are summarized above and listed in the Patient Instructions accessible in Encounters.   SignedChristell Faith, PA-C 08/13/2022 10:09 AM     [address] [place], [place] [id number] [phone]

## 2022-08-13 ENCOUNTER — Ambulatory Visit: Payer: BC Managed Care – PPO | Attending: Physician Assistant | Admitting: Physician Assistant

## 2022-08-13 ENCOUNTER — Encounter: Payer: Self-pay | Admitting: Physician Assistant

## 2022-08-13 VITALS — BP 98/66 | HR 99 | Ht 60.0 in | Wt 132.0 lb

## 2022-08-13 DIAGNOSIS — I5032 Chronic diastolic (congestive) heart failure: Secondary | ICD-10-CM | POA: Diagnosis not present

## 2022-08-13 DIAGNOSIS — I471 Supraventricular tachycardia, unspecified: Secondary | ICD-10-CM | POA: Diagnosis not present

## 2022-08-13 DIAGNOSIS — R7989 Other specified abnormal findings of blood chemistry: Secondary | ICD-10-CM

## 2022-08-13 DIAGNOSIS — E119 Type 2 diabetes mellitus without complications: Secondary | ICD-10-CM

## 2022-08-13 DIAGNOSIS — Z8673 Personal history of transient ischemic attack (TIA), and cerebral infarction without residual deficits: Secondary | ICD-10-CM

## 2022-08-13 MED ORDER — FUROSEMIDE 20 MG PO TABS: 20.0000 mg | ORAL_TABLET | Freq: Every day | ORAL | 3 refills | Status: DC

## 2022-08-13 NOTE — Patient Instructions (Signed)
Medication Instructions:  - Your physician recommends that you continue on your current medications as directed. Please refer to the Current Medication list given to you today.  *If you need a refill on your cardiac medications before your next appointment, please call your pharmacy*   Lab Work: - none ordered  If you have labs (blood work) drawn today and your tests are completely normal, you will receive your results only by: [place] (if you have MyChart) OR A paper copy in the mail If you have any lab test that is abnormal or we need to change your treatment, we will call you to review the results.   Testing/Procedures: - none ordered   Follow-Up: At [facility], you and your health needs are our priority.  As part of our continuing mission to provide you with exceptional heart care, we have created designated Provider Care Teams.  These Care Teams include your primary Cardiologist (physician) and Advanced Practice Providers (APPs -  Physician Assistants and Nurse Practitioners) who all work together to provide you with the care you need, when you need it.  We recommend signing up for the patient portal called "MyChart".  Sign up information is provided on this After Visit Summary.  MyChart is used to connect with patients for Virtual Visits (Telemedicine).  Patients are able to view lab/test results, encounter notes, upcoming appointments, etc.  Non-urgent messages can be sent to your provider as well.   To learn more about what you can do with MyChart, go to [email or web address].    Your next appointment:   6 month(s)  Provider:   You may see Nelva Bush, MD or one of the following Advanced Practice Providers on your designated Care Team:    Christell Faith, PA-C   Other Instructions N/a

## 2022-08-15 ENCOUNTER — Ambulatory Visit: Payer: Self-pay | Admitting: *Deleted

## 2022-08-15 NOTE — Telephone Encounter (Addendum)
4:30. Pt's husband returned call, pt is home. Advised to CB if needed. Chief Complaint: Falls, Anxiety. Symptoms: [place] Monday, missed step. States has been falling a lot. Reports fell on right knee, no longer painful but lower and upper back are with movement only. Reports severe anxiety presently, sitting in parking lot at [facility], "Afraid to go in, afraid of falling, afraid I might die of stroke." Pt with calm affect, speech clear, non halting. States took an Ativan 10-20 minutes prior to call, "Doesn't work." Frequency: [place] Monday, anxious today, worse prior to call. Pertinent Negatives: Patient denies  Disposition: '[]'$ ED /'[]'$ Urgent Care (no appt availability in office) / '[]'$ Appointment(In office/virtual)/ '[]'$  [facility]/ '[]'$ Home Care/ '[]'$ Refused Recommended Disposition /'[]'$  Mobile Bus/ '[x]'$  Follow-up with PCP Additional Notes: Pt is alone in car. "Too afraid to go in to [facility]." Advised and offered EMS, declines. Offered as wellness check, declines. Pt states she will call husband to come get her. Offered to call husband for her, declines. States she will not drive. Pt reports she has appt at 1:00 tomorrow with psychiatrist. Assured pt NT would route to practice for PCPs review. Pt has appt 08/28/22, asking to be seen sooner. Advised 911 if symptoms worsen, verbalizes understanding.  Attempted to reach husband to ensure pt did call him for assist, left message. Placed pt in CB to ensure she did get home safely.  Alerted I[facility] to situation. Reason for Disposition  [1] Panic attack symptoms (diagnosed in the past) AND [2] not better with usual treatment, reassurance, or Care Advice  Answer Assessment - Initial Assessment Questions 1. MECHANISM: "How did the fall happen?"     Missed last step 2. DOMESTIC VIOLENCE AND ELDER ABUSE SCREENING: "Did you fall because someone pushed you or tried to hurt you?" If Yes, ask: "Are you safe now?"     No 3. ONSET: "When did the fall happen?"  (e.g., minutes, hours, or days ago)     Monday 4. LOCATION: "What part of the body hit the ground?" (e.g., back, buttocks, head, hips, knees, hands, head, stomach)     Right knee, swelling, small scrap. 5. INJURY: "Did you hurt (injure) yourself when you fell?" If Yes, ask: "What did you injure? Tell me more about this?" (e.g., body area; type of injury; pain severity)"     Back sore, lower and upper, pain with movement only, no knee pain 6. PAIN: "Is there any pain?" If Yes, ask: "How bad is the pain?" (e.g., Scale 1-10; or mild,  moderate, severe)   - NONE (0): No pain   - MILD (1-3): Doesn't interfere with normal activities    - MODERATE (4-7): Interferes with normal activities or awakens from sleep    - SEVERE (8-10): Excruciating pain, unable to do any normal activities      10/10 7. SIZE: For cuts, bruises, or swelling, ask: "How large is it?" (e.g., inches or centimeters)      Small scrap of knee 9. OTHER SYMPTOMS: "Do you have any other symptoms?" (e.g., dizziness, fever, weakness; new onset or worsening).      Anxious 10. CAUSE: "What do you think caused the fall (or falling)?" (e.g., tripped, dizzy spell)       Missed step  Answer Assessment - Initial Assessment Questions 1. CONCERN: "Did anything happen that prompted you to call today?"      Fear of falling 2. ANXIETY SYMPTOMS: "Can you describe how you (your loved one; patient) have been feeling?" (e.g., tense, restless,  panicky, anxious, keyed up, overwhelmed, sense of impending doom).      Anxious 3. ONSET: "How long have you been feeling this way?" (e.g., hours, days, weeks)     Worse today 4. SEVERITY: "How would you rate the level of anxiety?" (e.g., 0 - 10; or mild, moderate, severe).     10/10 5. FUNCTIONAL IMPAIRMENT: "How have these feelings affected your ability to do daily activities?" "Have you had more difficulty than usual doing your normal daily activities?" (e.g., getting better, same, worse; self-care,  school, work, interactions)     Afraid of dying. 6. HISTORY: "Have you felt this way before?" "Have you ever been diagnosed with an anxiety problem in the past?" (e.g., generalized anxiety disorder, panic attacks, PTSD). If Yes, ask: "How was this problem treated?" (e.g., medicines, counseling, etc.)     Yes 7. RISK OF HARM - SUICIDAL IDEATION: "Do you ever have thoughts of hurting or killing yourself?" If Yes, ask:  "Do you have these feelings now?" "Do you have a plan on how you would do this?"     No 8. TREATMENT:  "What has been done so far to treat this anxiety?" (e.g., medicines, relaxation strategies). "What has helped?"     Ativan, "Not helping." 9. TREATMENT - THERAPIST: "Do you have a counselor or therapist? Name?"     Yes. Appt tomorrow at 1:00 10. POTENTIAL TRIGGERS: "Do you drink caffeinated beverages (e.g., coffee, colas, teas), and how much daily?" "Do you drink alcohol or use any drugs?" "Have you started any new medicines recently?"        11. PATIENT SUPPORT: "Who is with you now?" "Who do you live with?" "Do you have family or friends who you can talk to?"        Alone 12. OTHER SYMPTOMS: "Do you have any other symptoms?" (e.g., feeling depressed, trouble concentrating, trouble sleeping, trouble breathing, palpitations or fast heartbeat, chest pain, sweating, nausea, or diarrhea)  Protocols used: Falls and Falling-A-AH, Anxiety and Panic Attack-A-AH

## 2022-08-16 ENCOUNTER — Encounter: Payer: Self-pay | Admitting: Physician Assistant

## 2022-08-16 ENCOUNTER — Ambulatory Visit (INDEPENDENT_AMBULATORY_CARE_PROVIDER_SITE_OTHER): Payer: BC Managed Care – PPO | Admitting: Physician Assistant

## 2022-08-16 DIAGNOSIS — F411 Generalized anxiety disorder: Secondary | ICD-10-CM

## 2022-08-16 DIAGNOSIS — G4700 Insomnia, unspecified: Secondary | ICD-10-CM

## 2022-08-16 DIAGNOSIS — E559 Vitamin D deficiency, unspecified: Secondary | ICD-10-CM

## 2022-08-16 DIAGNOSIS — F319 Bipolar disorder, unspecified: Secondary | ICD-10-CM

## 2022-08-16 DIAGNOSIS — G2581 Restless legs syndrome: Secondary | ICD-10-CM

## 2022-08-16 DIAGNOSIS — R69 Illness, unspecified: Secondary | ICD-10-CM

## 2022-08-16 DIAGNOSIS — F1320 Sedative, hypnotic or anxiolytic dependence, uncomplicated: Secondary | ICD-10-CM | POA: Diagnosis not present

## 2022-08-16 MED ORDER — BUSPIRONE HCL 5 MG PO TABS: 5.0000 mg | ORAL_TABLET | Freq: Two times a day (BID) | ORAL | 1 refills | Status: DC

## 2022-08-16 MED ORDER — ALPRAZOLAM 1 MG PO TABS: ORAL_TABLET | ORAL | 0 refills | Status: DC

## 2022-08-16 NOTE — Patient Instructions (Addendum)
New dosage of Xanax 1 mg, take one every morning, one in the early afternoon, and 1/2 every evening, for 2 weeks.  Start taking Buspar 5 mg, one twice a day. Increase Zoloft 100 mg, to 1.5 pills for 2 weeks.

## 2022-08-16 NOTE — Telephone Encounter (Signed)
Called and spoke with patient. States she is better from yesterday, states she had a panic attack and is seeing psychiatry today at 1:00. Patient states she has not seen Neurology yet, but is going to call and schedule an appointment.

## 2022-08-16 NOTE — Progress Notes (Signed)
[facility]  Patient ID: Amy Hansen,  MRN: [id number]  PCP: [name], NP  Date of Evaluation: 08/16/2022  Time spent:40 minutes  Chief Complaint:  Chief Complaint   Anxiety; Depression; Follow-up    HISTORY/CURRENT STATUS: More anxious  More anxious, yesterday had a PA that lasted about 30 mins. She and her husband had a discussion yesterday about something important, 'we weren't fussing or anything, I don't know if that's what caused it or not.' States Xanax didn't help that time, Admits to taking 4 pills per day some of the time. Runs out early, gets anxious, c/o forgetting things a lot, has to write things down. Not new but worse. Has fallen a few times. No serious injury.  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  Is very nervous about that, not knowing what she will do when she gets panicky.  She misunderstood and has not been taking Zoloft 200 mg as directed, only taking 100 mg.  Patient is able to enjoy things.  Energy and motivation are fair to good, depending on the day.  Work is going ok.   No extreme sadness, tearfulness, or feelings of hopelessness.  Sleeps well most of the time. ADLs and personal hygiene are normal.   Denies any changes remembering things.  Has a hard time focusing, gets distracted easily.  Feels like her thinking is "cloudy."  She cannot think of words, gets confused sometimes.  This is no worse than normal for her.  Appetite has not changed.  Weight is stable.  Denies suicidal or homicidal thoughts.  Patient denies increased energy with decreased need for sleep, increased talkativeness, racing thoughts, impulsivity or risky behaviors, increased spending, increased libido, grandiosity, increased irritability or anger, paranoia, or hallucinations.  Has fallen a few times since the last visit.  No serious injury.  Denies dizziness, syncope, seizures, numbness, tingling, tremor, tics, slurred speech,  confusion. Denies muscle or joint pain, stiffness, or dystonia.Denies unexplained weight loss, frequent infections, or sores that heal slowly.  No polyphagia, polydipsia, or polyuria. Denies visual changes or paresthesias.  PCP follows labs.  Individual Medical History/ Review of Systems: Changes? :Yes    cellulitis on right arm, her dog scratched her which led to it.   Past medications for mental health diagnoses include: Trazodone, Risperdal, Zoloft, Lunesta, prazosin, Sonata, Prozac, Depakote, Lamictal, lithium, Wellbutrin, Xanax, Ambien, carbamazepine, Seroquel   Allergies: Avelox [moxifloxacin hcl in nacl], Bactrim [sulfamethoxazole-trimethoprim], Ciprofloxacin, Neomycin-polymyxin-dexameth, Aquaphor [lanolin-petrolatum], Depakote [divalproex sodium], Imitrex [sumatriptan], and Stadol [butorphanol]  Current Medications:  Current Outpatient Medications:    aspirin EC 81 MG tablet, Take 1 tablet (81 mg) by mouth once daily. Swallow whole., Disp: , Rfl:    buPROPion (WELLBUTRIN XL) 150 MG 24 hr tablet, Take 3 tablets (450 mg total) by mouth daily., Disp: 270 tablet, Rfl: 3   busPIRone (BUSPAR) 5 MG tablet, Take 1 tablet (5 mg total) by mouth 2 (two) times daily., Disp: 60 tablet, Rfl: 1   erythromycin ophthalmic ointment, Place 1 Application into the left eye 2 (two) times daily., Disp: , Rfl:    furosemide (LASIX) 20 MG tablet, Take 1 tablet (20 mg total) by mouth daily. May take an additional tablet ('20mg'$ ) AS NEEDED for weight gain and/or swelling., Disp: 90 tablet, Rfl: 3   gabapentin (NEURONTIN) 100 MG capsule, Take 200 mg by mouth 2 (two) times daily., Disp: , Rfl:    lamoTRIgine (LAMICTAL) 200 MG tablet, Take  2 tablets (400 mg total) by mouth at bedtime., Disp: 60 tablet, Rfl: 11   mupirocin ointment (BACTROBAN) 2 %, Apply 1 Application topically 2 (two) times daily., Disp: , Rfl:    omeprazole (PRILOSEC) 40 MG capsule, Omeprazole 40 mg once daily for 6 weeks, Disp: 30 capsule, Rfl: 1    ondansetron (ZOFRAN-ODT) 8 MG disintegrating tablet, Take 1 tablet (8 mg total) by mouth every 8 (eight) hours as needed for nausea or vomiting., Disp: 60 tablet, Rfl: 1   promethazine (PHENERGAN) 25 MG tablet, Take 1 tablet (25 mg total) by mouth every 8 (eight) hours as needed for nausea or vomiting., Disp: 20 tablet, Rfl: 0   QUEtiapine (SEROQUEL) 300 MG tablet, TAKE 1 TABLET BY MOUTH AT BEDTIME, Disp: 30 tablet, Rfl: 0   Rimegepant Sulfate (NURTEC) 75 MG TBDP, Take 1 tablet by mouth daily as needed., Disp: 10 tablet, Rfl: 0   rOPINIRole (REQUIP) 1 MG tablet, Take 1 tablet (1 mg total) by mouth 2 (two) times daily., Disp: 180 tablet, Rfl: 1   rosuvastatin (CRESTOR) 5 MG tablet, Take 1 tablet (5 mg total) by mouth daily., Disp: 90 tablet, Rfl: 1   sertraline (ZOLOFT) 100 MG tablet, Take 2 tablets by mouth once daily (Patient taking differently: Take 100 mg by mouth daily.), Disp: 60 tablet, Rfl: 3   tirzepatide (MOUNJARO) 5 MG/0.5ML Pen, Inject 5 mg into the skin once a week., Disp: 6 mL, Rfl: 1   vedolizumab (ENTYVIO) 300 MG injection, Inject into the vein. Every 4 weeks, Disp: , Rfl:    ALPRAZolam (XANAX) 1 MG tablet, 1 q am, 1 in afternoon, 1/2 in evening, Disp: 90 tablet, Rfl: 0   metFORMIN (GLUCOPHAGE) 1000 MG tablet, Take 1 tablet by mouth twice daily. (Patient not taking: Reported on 08/16/2022), Disp: 180 tablet, Rfl: 1 Medication Side Effects: none  Family Medical/ Social History: Changes?  no  MENTAL HEALTH EXAM:  There were no vitals taken for this visit.There is no height or weight on file to calculate BMI.  General Appearance: Casual, Neat, and Well Groomed  Eye Contact:  Good  Speech:  Clear and Coherent and Normal Rate  Volume:  Normal  Mood:  Anxious  Affect:  Anxious  Thought Process:  Goal Directed and Descriptions of Associations: Circumstantial  Orientation:  Full (Time, Place, and Person)  Thought Content: Logical   Suicidal Thoughts:  No  Homicidal Thoughts:  No   Memory:  WNL  Judgement:  Good  Insight:  Good  Psychomotor Activity:  Normal  Concentration:  Concentration: Good and Attention Span: Good  Recall:  Good  Fund of Knowledge: Good  Language: Good  Assets:  Desire for Improvement Financial Resources/Insurance Housing Social Support Transportation  ADL's:  Intact  Cognition: WNL  Prognosis:  Good   Labs 08/02/2022 Hemoglobin A1c 6.1 Reviewed all labs on chart.  DIAGNOSES:    ICD-10-CM   1. Generalized anxiety disorder  F41.1     2. Benzodiazepine dependence (HCC)  F13.20     3. Bipolar I disorder ([place])  F31.9     4. Insomnia, unspecified type  G47.00     5. Restless legs syndrome (RLS)  G25.81     6. Chronic illness  R69     7. Vitamin D deficiency  E55.9       Receiving Psychotherapy: No   RECOMMENDATIONS:  PDMP reviewed.  Last Xanax was filled 07/20/2022.  I provided 40 minutes of face to face time during this encounter, including  time spent before and after the visit in records review, medical decision making, counseling pertinent to today's visit, and charting.   We discussed the dependence on Xanax.  She is overtaking it.  She reports clouded thinking and has had a couple of falls and even though she did not suffer an injury, the Xanax could be contributing to them. Will wean off slowly. Also recommend starting Buspar. Benefits, risks, SE discussed and she would like to try it. Also increase Zoloft, should have been on 200 mg all along.  Changes made today are listed on the AVS.  Weaning off Xanax. Only send 1-2 wks at a time   Decreased Xanax 1 mg to 1 p.o. every morning, 1 p.o. q. afternoon and 1/2 pill in the evening. Continue Wellbutrin XL 150 mg, 3 p.o. daily. Start BuSpar 5 mg, 1 p.o. twice daily. Continue Lamictal 200 mg, 1 p.o. twice daily. Continue Seroquel 300 mg, 1 p.o. nightly. Continue Requip 1 mg bid (PCP) Increase Zoloft to 150 mg, 2 weeks, then 2 qd.  Continue Vit D 50,000 iu weekly. Return  in 2 weeks.   Donnal Moat, PA-C

## 2022-08-16 NOTE — Telephone Encounter (Signed)
Can you find out if patient has been seen by Neurology?

## 2022-08-16 NOTE — Telephone Encounter (Signed)
I highly recommend patient call neurology and make an appointment and since she is doing better I will see her on January 30.

## 2022-08-17 ENCOUNTER — Telehealth: Payer: Self-pay

## 2022-08-17 DIAGNOSIS — D509 Iron deficiency anemia, unspecified: Secondary | ICD-10-CM

## 2022-08-17 NOTE — Telephone Encounter (Signed)
-----  Message from Yevette Edwards, RN sent at 07/06/2022 11:35 AM EST ----- Regarding: Labs CBC and IBC + Ferritin - need to enter orders

## 2022-08-17 NOTE — Telephone Encounter (Signed)
Spoke with patient to remind her that she is due for repeat labs at this time. No appointment is necessary. Patient is aware that she can stop by the lab in the basement at her convenience between 7:30 AM - 5 PM, Monday through Friday. Patient verbalized understanding and had no concerns at the end of the call.   Lab orders in epic. 

## 2022-08-19 ENCOUNTER — Emergency Department
Admission: EM | Admit: 2022-08-19 | Discharge: 2022-08-19 | Disposition: A | Discharge status: Home / Self Care | Payer: BC Managed Care – PPO | Attending: Emergency Medicine | Admitting: Emergency Medicine

## 2022-08-19 DIAGNOSIS — I509 Heart failure, unspecified: Secondary | ICD-10-CM | POA: Diagnosis not present

## 2022-08-19 DIAGNOSIS — I110 Hypertensive heart disease with heart failure: Secondary | ICD-10-CM | POA: Diagnosis not present

## 2022-08-19 DIAGNOSIS — L03114 Cellulitis of left upper limb: Secondary | ICD-10-CM | POA: Diagnosis not present

## 2022-08-19 DIAGNOSIS — E119 Type 2 diabetes mellitus without complications: Secondary | ICD-10-CM | POA: Diagnosis not present

## 2022-08-19 DIAGNOSIS — Z5189 Encounter for other specified aftercare: Secondary | ICD-10-CM

## 2022-08-19 DIAGNOSIS — Z4801 Encounter for change or removal of surgical wound dressing: Secondary | ICD-10-CM | POA: Diagnosis not present

## 2022-08-19 LAB — CBC WITH DIFFERENTIAL/PLATELET
Abs Immature Granulocytes: 0 10*3/uL (ref 0.00–0.07)
Basophils Absolute: 0 10*3/uL (ref 0.0–0.1)
Basophils Relative: 0 %
Eosinophils Absolute: 0 10*3/uL (ref 0.0–0.5)
Eosinophils Relative: 1 %
HCT: 37.1 % (ref 36.0–46.0)
Hemoglobin: 11.8 g/dL — ABNORMAL LOW (ref 12.0–15.0)
Immature Granulocytes: 0 %
Lymphocytes Relative: 42 %
Lymphs Abs: 1.5 10*3/uL (ref 0.7–4.0)
MCH: 27.6 pg (ref 26.0–34.0)
MCHC: 31.8 g/dL (ref 30.0–36.0)
MCV: 86.7 fL (ref 80.0–100.0)
Monocytes Absolute: 0.3 10*3/uL (ref 0.1–1.0)
Monocytes Relative: 7 %
Neutro Abs: 1.7 10*3/uL (ref 1.7–7.7)
Neutrophils Relative %: 50 %
Platelets: 274 10*3/uL (ref 150–400)
RBC: 4.28 MIL/uL (ref 3.87–5.11)
RDW: 20 % — ABNORMAL HIGH (ref 11.5–15.5)
WBC: 3.5 10*3/uL — ABNORMAL LOW (ref 4.0–10.5)
nRBC: 0 % (ref 0.0–0.2)

## 2022-08-19 LAB — BASIC METABOLIC PANEL
Anion gap: 4 — ABNORMAL LOW (ref 5–15)
BUN: 10 mg/dL (ref 8–23)
CO2: 27 mmol/L (ref 22–32)
Calcium: 8.6 mg/dL — ABNORMAL LOW (ref 8.9–10.3)
Chloride: 104 mmol/L (ref 98–111)
Creatinine, Ser: 0.79 mg/dL (ref 0.44–1.00)
GFR, Estimated: >60 mL/min (ref >60)
Glucose, Bld: 99 mg/dL (ref 70–99)
Potassium: 4.1 mmol/L (ref 3.5–5.1)
Sodium: 135 mmol/L (ref 135–145)

## 2022-08-19 LAB — SEDIMENTATION RATE: Sed Rate: 10 mm/hr (ref 0–30)

## 2022-08-19 MED ORDER — DIPHENHYDRAMINE HCL 50 MG/ML IJ SOLN
12.5000 mg | Freq: Once | INTRAMUSCULAR | Status: AC
  Administered 2022-08-19: 12.5 mg via INTRAVENOUS
  Filled 2022-08-19: qty 1

## 2022-08-19 MED ORDER — DEXTROSE 5 % IV SOLN
1500.0000 mg | Freq: Once | INTRAVENOUS | Status: AC
  Administered 2022-08-19: 1500 mg via INTRAVENOUS
  Filled 2022-08-19: qty 75

## 2022-08-19 MED ORDER — TETANUS-DIPHTH-ACELL PERTUSSIS 5-2.5-18.5 LF-MCG/0.5 IM SUSY
0.5000 mL | PREFILLED_SYRINGE | Freq: Once | INTRAMUSCULAR | Status: DC
  Filled 2022-08-19: qty 0.5

## 2022-08-19 MED ORDER — SODIUM CHLORIDE 0.9 % IV SOLN
2.0000 g | INTRAVENOUS | Status: DC
  Administered 2022-08-19: 2 g via INTRAVENOUS
  Filled 2022-08-19: qty 20

## 2022-08-19 NOTE — ED Notes (Signed)
See triage note. Pt states she got scratched by her dog on approximately January 5 and since then the cellulitis has only grown and worsened.

## 2022-08-19 NOTE — ED Notes (Signed)
Pharmacy states medication is being made currently and will send when completed.

## 2022-08-19 NOTE — ED Notes (Signed)
Still no medication received from pharmacy.

## 2022-08-19 NOTE — ED Provider Notes (Signed)
[facility] Provider Note    Event Date/Time   First MD Initiated Contact with Patient 08/19/22 1549     (approximate)   History   Wound Check   HPI  Amy Hansen is a 67 y.o. female with a past medical history of diabetes, depression, GERD, heart failure, hypertension who presents today for evaluation of cellulitis to her arm.  Patient reports that several weeks ago she was scratched by her dog.  She was originally placed on Keflex, and then seen again on 08/07/2022 and started on cefdinir and doxycycline given that symptoms had worsened.  Patient reports that her symptoms have continued to worsen.  No fevers or chills.  She feels that the redness is worse.  She reports that the area is tender to touch.  Patient Active Problem List   Diagnosis Date Noted   Cellulitis of right upper extremity 08/06/2022   Confusion 07/18/2022   Nausea 06/20/2022   History of TIA (transient ischemic attack) 05/01/2022   Hypertriglyceridemia 04/30/2022   Memory change 01/18/2022   Primary osteoarthritis of right knee 08/27/2021   Protein-calorie malnutrition, severe 02/09/2021   Pancolitis ([place]) 04/25/2020   Major depression, chronic 06/01/2018   OCD (obsessive compulsive disorder) 06/01/2018   Chronic heart failure with preserved ejection fraction ([place]) 05/16/2018   GERD (gastroesophageal reflux disease) 02/18/2017   Iron deficiency anemia 10/18/2016   Hypertension 03/21/2016   Vitamin D deficiency 02/21/2016   B12 deficiency 02/21/2016   Poorly controlled type 2 diabetes mellitus ([place]) 09/13/2015   Migraines 05/23/2015   Restless legs syndrome (RLS) 05/23/2015   Recurrent UTI 04/26/2015   Atrophic vaginitis 04/26/2015          Physical Exam   Triage Vital Signs: ED Triage Vitals  Enc Vitals Group     BP 08/19/22 1523 (!) 148/88     Pulse Rate 08/19/22 1523 85     Resp 08/19/22 1523 18     Temp 08/19/22 1523 98.2 F (36.8 C)     Temp Source 08/19/22  1523 Oral     SpO2 08/19/22 1523 98 %     Weight 08/19/22 1524 132 lb (59.9 kg)     Height --      Head Circumference --      Peak Flow --      Pain Score 08/19/22 1523 10     Pain Loc --      Pain Edu? --      Excl. in [place]? --     Most recent vital signs: Vitals:   08/19/22 1523 08/19/22 1600  BP: (!) 148/88 129/84  Pulse: 85 82  Resp: 18   Temp: 98.2 F (36.8 C)   SpO2: 98% 97%    Physical Exam Vitals and nursing note reviewed.  Constitutional:      General: Awake and alert. No acute distress.    Appearance: Normal appearance. The patient is normal weight.  HENT:     Head: Normocephalic and atraumatic.     Mouth: Mucous membranes are moist.  Eyes:     General: PERRL. Normal EOMs        Right eye: No discharge.        Left eye: No discharge.     Conjunctiva/sclera: Conjunctivae normal.  Cardiovascular:     Rate and Rhythm: Normal rate and regular rhythm.     Pulses: Normal pulses.  Pulmonary:     Effort: Pulmonary effort is normal. No respiratory distress.  Breath sounds: Normal breath sounds.  Abdominal:     Abdomen is soft. There is no abdominal tenderness.  Musculoskeletal:        General: No swelling. Normal range of motion.     Cervical back: Normal range of motion and neck supple.  Skin:    General: Skin is warm and dry.     Capillary Refill: Capillary refill takes less than 2 seconds.     Findings: Right forearm with erythema in the entirety of the dorsum of the forearm, though not circumferential.  No lymphangitis.  Compartments are soft and compressible.  Tender to touch warm to the touch.  No involvement of wrist or elbow Neurological:     Mental Status: The patient is awake and alert.      ED Results / Procedures / Treatments   Labs (all labs ordered are listed, but only abnormal results are displayed) Labs Reviewed  BASIC METABOLIC PANEL - Abnormal; Notable for the following components:      Result Value   Calcium 8.6 (*)    Anion gap 4  (*)    All other components within normal limits  CBC WITH DIFFERENTIAL/PLATELET - Abnormal; Notable for the following components:   WBC 3.5 (*)    Hemoglobin 11.8 (*)    RDW 20.0 (*)    All other components within normal limits  SEDIMENTATION RATE     EKG     RADIOLOGY     PROCEDURES:  Critical Care performed:   Procedures   MEDICATIONS ORDERED IN ED: Medications  cefTRIAXone (ROCEPHIN) 2 g in sodium chloride 0.9 % 100 mL IVPB (0 g Intravenous Stopped 08/19/22 1659)  dalbavancin (DALVANCE) 1,500 mg in dextrose 5 % 500 mL IVPB (0 mg Intravenous Hold 08/19/22 1738)  diphenhydrAMINE (BENADRYL) injection 12.5 mg (12.5 mg Intravenous Given 08/19/22 1820)     IMPRESSION / MDM / ASSESSMENT AND PLAN / ED COURSE  I reviewed the triage vital signs and the nursing notes.   Differential diagnosis includes, but is not limited to, cellulitis, dermatitis, less likely abscess.  I reviewed the patient's chart.  Patient was seen originally on 07/28/2022 after she was scratched by her dog.  At that time she had a 3 x 3 cm area of erythema along the dorsum of her forearm.  She was seen again on 08/07/2022 and had an area that was 10 x 6 cm of erythema and she was started on doxycycline, mupirocin, and cefdinir.  Patient was seen 2 days later and was started on Aquaphor and Vaseline.    Patient was given rocephin upon arrival. Patient's labs are overall quite reassuring.  No leukocytosis or elevated sedimentation rate which is reassuring against infection.  Patient reports that the area is very itchy and she is requesting Benadryl to help with the itchiness which was ordered.  I had Dr. Corky Downs evaluate the patient to help ascertain whether this is infection versus dermatitis-type reaction given that patient is not responding to antibiotics, no constitutional symptoms, and scaly nature of the rash with her negative laboratory findings.  Dr. Corky Downs recommends [facility] infusion and infectious disease  referral.  Patient is in agreement with this plan.   Patient's presentation is most consistent with acute complicated illness / injury requiring diagnostic workup.    FINAL CLINICAL IMPRESSION(S) / ED DIAGNOSES   Final diagnoses:  Visit for wound check  Cellulitis of left upper extremity     Rx / DC Orders   ED Discharge Orders  Ordered    Ambulatory referral to Infectious Disease       Comments: Cellulitis patient:  Received dalbavancin on 08/19/2022.   08/19/22 1714             Note:  This document was prepared using Dragon voice recognition software and may include unintentional dictation errors.   Emeline Gins 08/19/22 1844    Lavonia Drafts, MD 08/19/22 [phone]

## 2022-08-19 NOTE — ED Triage Notes (Addendum)
Pt sts that she has cellulitis on her right arm. Pt sts that she can't use any ointments as it makes it worse. Pt sts that it is spreading. Pt sts that her dog scratched her arm weeks ago that started this.

## 2022-08-19 NOTE — ED Notes (Signed)
Pt in NAD; pt reports R arm looks significantly better than earlier today.

## 2022-08-19 NOTE — Discharge Instructions (Addendum)
You were given an antibiotic in the emergency department.  Please follow-up with infectious disease, a referral has been placed.  Please return for any new, worsening, or change in symptoms or other concerns including worsening redness, worsening pain, worsening swelling, fevers, chills, or other concerns.  It was a pleasure caring for you today.

## 2022-08-19 NOTE — ED Notes (Signed)
Called pharmacy they finished making medication sending it to ED flex.

## 2022-08-20 ENCOUNTER — Other Ambulatory Visit: Payer: Self-pay | Admitting: Physician Assistant

## 2022-08-21 ENCOUNTER — Other Ambulatory Visit: Payer: Self-pay

## 2022-08-21 MED ORDER — QUETIAPINE FUMARATE 300 MG PO TABS: 300.0000 mg | ORAL_TABLET | Freq: Every day | ORAL | 0 refills | Status: DC

## 2022-08-22 ENCOUNTER — Ambulatory Visit: Payer: Self-pay

## 2022-08-22 ENCOUNTER — Telehealth: Payer: Self-pay | Admitting: Physician Assistant

## 2022-08-22 NOTE — Telephone Encounter (Signed)
No she can stop the Buspar. The increase in Zoloft last week will help the anxiety.

## 2022-08-22 NOTE — Telephone Encounter (Signed)
Amy Hansen called at 9:15 to report that she cannot tolerate Buspar.  She is experiencing hallucinations, imbalance, (she has fallen 2-3 times).  The side effects have just been awful and she is going to stop taking this medication. Please call her about this and to discuss other options.  Appt 2/1

## 2022-08-22 NOTE — Telephone Encounter (Signed)
Recommend patient be seen by another provider before 1/30.  We can keep the appointment with me on 1/30 even if she is seen before then.

## 2022-08-22 NOTE — Telephone Encounter (Signed)
Chief Complaint: Falls x 2 on Monday and today Symptoms: dizzy, thought processes are off, confusion as to where things are Frequency: Fall happened today and Monday; since starting on Buspar Pertinent Negatives: Patient denies other symptoms Disposition: '[]'$ ED /'[]'$ Urgent Care (no appt availability in office) / '[x]'$ Appointment(In office/virtual)/ '[]'$  [facility]/ '[]'$ Home Care/ '[x]'$ Refused Recommended Disposition /'[]'$  Mobile Bus/ '[]'$  Follow-up with PCP Additional Notes: Patient advised she will need an evaluation in the office tomorrow with a different provider as no availability with her provider. She says she only wants to see Santiago Glad and she has an appt coming up on 08/28/22, so if she's seen before then, she will not see Santiago Glad for months because that appt will be cancelled. She says she has called her psychiatrist about the Buspar saying since starting on Buspar she's been experiencing the weakness, dizziness, falls, thoughts are off. She says she will wait to hear from them, but wanted Santiago Glad to know about the falls. I advised falling is concerning and she will need an evaluation. She says she will call back after talking to the psychiatrist is they tell her she needs to be seen and if she is available tomorrow. She mentioned having TIA's in the past and that she's having a MRI to rule out stroke, but refuses to be evaluated for current issues. Advised I will send this to Santiago Glad and someone will call back with her recommendations.    Reason for Disposition  MILD weakness (i.e., does not interfere with ability to work, go to school, normal activities)  (Exception: Mild weakness is a chronic symptom.)  Answer Assessment - Initial Assessment Questions 1. MECHANISM: "How did the fall happen?"     Opening pantry, turn around and fell into the pantry 2. DOMESTIC VIOLENCE AND ELDER ABUSE SCREENING: "Did you fall because someone pushed you or tried to hurt you?" If Yes, ask: "Are you safe  now?"     N/A 3. ONSET: "When did the fall happen?" (e.g., minutes, hours, or days ago)     Today and Monday 4. LOCATION: "What part of the body hit the ground?" (e.g., back, buttocks, head, hips, knees, hands, head, stomach)     Hit the floor and right knee to the blunt of both falls  5. INJURY: "Did you hurt (injure) yourself when you fell?" If Yes, ask: "What did you injure? Tell me more about this?" (e.g., body area; type of injury; pain severity)"     Right knee some bruising and a little carpet burn 6. PAIN: "Is there any pain?" If Yes, ask: "How bad is the pain?" (e.g., Scale 1-10; or mild,  moderate, severe)   - NONE (0): No pain   - MILD (1-3): Doesn't interfere with normal activities    - MODERATE (4-7): Interferes with normal activities or awakens from sleep    - SEVERE (8-10): Excruciating pain, unable to do any normal activities      Mild 7. SIZE: For cuts, bruises, or swelling, ask: "How large is it?" (e.g., inches or centimeters)      N/A 8. PREGNANCY: "Is there any chance you are pregnant?" "When was your last menstrual period?"     N/A 9. OTHER SYMPTOMS: "Do you have any other symptoms?" (e.g., dizziness, fever, weakness; new onset or worsening).      Dizziness, thought process not as clear/confusion, not able to pronounce some words 10. CAUSE: "What do you think caused the fall (or falling)?" (e.g., tripped, dizzy spell)  Buspar  Protocols used: Falls and [facility]

## 2022-08-22 NOTE — Telephone Encounter (Signed)
Please review.Does she need to wean off med ?

## 2022-08-22 NOTE — Telephone Encounter (Signed)
Pt scheduled 1/26

## 2022-08-22 NOTE — Telephone Encounter (Signed)
Please call and see if patient can schedule a visit before next week per Santiago Glad.

## 2022-08-22 NOTE — Telephone Encounter (Signed)
Pt informed

## 2022-08-23 ENCOUNTER — Ambulatory Visit: Payer: BC Managed Care – PPO | Admitting: Nurse Practitioner

## 2022-08-23 ENCOUNTER — Telehealth: Payer: Self-pay | Admitting: Physician Assistant

## 2022-08-23 ENCOUNTER — Other Ambulatory Visit: Payer: Self-pay

## 2022-08-23 MED ORDER — ALPRAZOLAM 1 MG PO TABS: ORAL_TABLET | ORAL | 0 refills | Status: DC

## 2022-08-23 NOTE — Telephone Encounter (Signed)
Pt called about Alprazolam Rx to wean off per apt 1/18. Pharmacy never received Rx. Resent 1/22 Rx is under print status. Please correct.

## 2022-08-23 NOTE — Telephone Encounter (Signed)
Pended.

## 2022-08-24 ENCOUNTER — Ambulatory Visit: Payer: BC Managed Care – PPO | Admitting: Nurse Practitioner

## 2022-08-24 ENCOUNTER — Encounter: Payer: Self-pay | Admitting: Nurse Practitioner

## 2022-08-24 ENCOUNTER — Other Ambulatory Visit (INDEPENDENT_AMBULATORY_CARE_PROVIDER_SITE_OTHER): Payer: BC Managed Care – PPO

## 2022-08-24 ENCOUNTER — Other Ambulatory Visit: Payer: Self-pay | Admitting: Nurse Practitioner

## 2022-08-24 VITALS — BP 117/76 | HR 83 | Temp 97.9°F | Wt 127.4 lb

## 2022-08-24 DIAGNOSIS — R413 Other amnesia: Secondary | ICD-10-CM

## 2022-08-24 DIAGNOSIS — D509 Iron deficiency anemia, unspecified: Secondary | ICD-10-CM | POA: Diagnosis not present

## 2022-08-24 DIAGNOSIS — N3001 Acute cystitis with hematuria: Secondary | ICD-10-CM | POA: Diagnosis not present

## 2022-08-24 LAB — URINALYSIS, ROUTINE W REFLEX MICROSCOPIC
Bilirubin, UA: NEGATIVE
Glucose, UA: NEGATIVE
Ketones, UA: NEGATIVE
Nitrite, UA: POSITIVE — AB
Protein,UA: NEGATIVE
RBC, UA: NEGATIVE
Specific Gravity, UA: 1.01 (ref 1.005–1.030)
Urobilinogen, Ur: 0.2 mg/dL (ref 0.2–1.0)
pH, UA: 5.5 (ref 5.0–7.5)

## 2022-08-24 LAB — CBC WITH DIFFERENTIAL/PLATELET
Basophils Absolute: 0 10*3/uL (ref 0.0–0.1)
Basophils Relative: 0.3 % (ref 0.0–3.0)
Eosinophils Absolute: 0.1 10*3/uL (ref 0.0–0.7)
Eosinophils Relative: 1 % (ref 0.0–5.0)
HCT: 38.6 % (ref 36.0–46.0)
Hemoglobin: 12.7 g/dL (ref 12.0–15.0)
Lymphocytes Relative: 32.7 % (ref 12.0–46.0)
Lymphs Abs: 1.7 10*3/uL (ref 0.7–4.0)
MCHC: 32.8 g/dL (ref 30.0–36.0)
MCV: 85.7 fl (ref 78.0–100.0)
Monocytes Absolute: 0.3 10*3/uL (ref 0.1–1.0)
Monocytes Relative: 6 % (ref 3.0–12.0)
Neutro Abs: 3.1 10*3/uL (ref 1.4–7.7)
Neutrophils Relative %: 60 % (ref 43.0–77.0)
Platelets: 313 10*3/uL (ref 150.0–400.0)
RBC: 4.5 Mil/uL (ref 3.87–5.11)
RDW: 20.1 % — ABNORMAL HIGH (ref 11.5–15.5)
WBC: 5.1 10*3/uL (ref 4.0–10.5)

## 2022-08-24 LAB — MICROSCOPIC EXAMINATION

## 2022-08-24 LAB — IBC + FERRITIN
Ferritin: 303.2 ng/mL — ABNORMAL HIGH (ref 10.0–291.0)
Iron: 101 ug/dL (ref 42–145)
Saturation Ratios: 36.1 % (ref 20.0–50.0)
TIBC: 280 ug/dL (ref 250.0–450.0)
Transferrin: 200 mg/dL — ABNORMAL LOW (ref 212.0–360.0)

## 2022-08-24 NOTE — Telephone Encounter (Signed)
Requested Prescriptions  Pending Prescriptions Disp Refills   rOPINIRole (REQUIP) 1 MG tablet [Pharmacy Med Name: rOPINIRole HCl 1 MG Oral Tablet] 180 tablet 3    Sig: Take 1 tablet by mouth twice daily     Neurology:  Parkinsonian Agents Failed - 08/24/2022 10:30 AM      Failed - Last BP in normal range    BP Readings from Last 1 Encounters:  08/24/22 117/76         Passed - Last Heart Rate in normal range    Pulse Readings from Last 1 Encounters:  08/24/22 83         Passed - Valid encounter within last 12 months    Recent Outpatient Visits           Today Memory change   [place] [name], NP   2 weeks ago Cellulitis of right upper extremity   [place], [place], PA-C   3 weeks ago Poorly controlled type 2 diabetes mellitus ([place])   [place], Dani Gobble, PA-C   1 month ago [place], Santiago Glad, NP   1 month ago Poorly controlled type 2 diabetes mellitus [facility])   [place] [name], NP       Future Appointments             In 4 days [name], NP [place], [facility]

## 2022-08-24 NOTE — Progress Notes (Signed)
BP 117/76   Pulse 83   Temp 97.9 F (36.6 C) (Oral)   Wt 127 lb 6.4 oz (57.8 kg)   LMP  (LMP Unknown)   SpO2 97%   BMI 24.88 kg/m    Subjective:    Patient ID: Amy Hansen, female    DOB: February 07, 1956, 67 y.o.   MRN: [id number]  HPI: Amy Hansen is a 67 y.o. female  Chief Complaint  Patient presents with   Fall    Pt states she had a fall on Monday and another on Wednesday. States her balance is off and her legs give out which is what makes her fall. States she has a cane that she has been using. States she wonders if she had a stroke after the fall on Wednesday. States she was trying to talk to her granddaughter after the fall and granddaughter could not understand her words, slurred speech.    Patient presents to clinic today following a fall on Monday and another on Wednesday. States her balance is off and her legs give out which is what makes her fall. States she has a cane that she has been using. States she wonders if she had a stroke after the fall on Wednesday. States she was trying to talk to her granddaughter after the fall and granddaughter could not understand her words, slurred speech. She is still having difficulty remembering her words in the middle of sentence.    She was started on Buspar by her psychiatrist which seemed to cause hallucinations on Tuesday.  She stopped the Buspar on Tuesday.  Since then she did fall on Wednesday and since then her symptoms seem to be better.  She is weaning off Xanax.   She had run out of Xanax and that was when she fell.     Relevant past medical, surgical, family and social history reviewed and updated as indicated. Interim medical history since our last visit reviewed. Allergies and medications reviewed and updated.  Review of Systems  Neurological:  Positive for speech difficulty.       Memory loss, fall    Per HPI unless specifically indicated above     Objective:    BP 117/76   Pulse 83   Temp 97.9 F (36.6 C)  (Oral)   Wt 127 lb 6.4 oz (57.8 kg)   LMP  (LMP Unknown)   SpO2 97%   BMI 24.88 kg/m   Wt Readings from Last 3 Encounters:  08/24/22 127 lb 6.4 oz (57.8 kg)  08/19/22 132 lb (59.9 kg)  08/13/22 132 lb (59.9 kg)    Physical Exam Vitals and nursing note reviewed.  Constitutional:      General: She is not in acute distress.    Appearance: Normal appearance. She is normal weight. She is not ill-appearing, toxic-appearing or diaphoretic.  HENT:     Head: Normocephalic.     Right Ear: External ear normal.     Left Ear: External ear normal.     Nose: Nose normal.     Mouth/Throat:     Mouth: Mucous membranes are moist.     Pharynx: Oropharynx is clear.  Eyes:     General:        Right eye: No discharge.        Left eye: No discharge.     Extraocular Movements: Extraocular movements intact.     Conjunctiva/sclera: Conjunctivae normal.     Pupils: Pupils are equal, round, and reactive to light.  Cardiovascular:     Rate and Rhythm: Normal rate and regular rhythm.     Heart sounds: No murmur heard. Pulmonary:     Effort: Pulmonary effort is normal. No respiratory distress.     Breath sounds: Normal breath sounds. No wheezing or rales.  Musculoskeletal:     Cervical back: Normal range of motion and neck supple.  Skin:    General: Skin is warm and dry.     Capillary Refill: Capillary refill takes less than 2 seconds.  Neurological:     General: No focal deficit present.     Mental Status: She is alert and oriented to person, place, and time. Mental status is at baseline.     Cranial Nerves: Cranial nerves 2-12 are intact.     Sensory: Sensation is intact.     Motor: Motor function is intact.     Coordination: Coordination is intact.     Gait: Gait is intact.  Psychiatric:        Mood and Affect: Mood normal.        Behavior: Behavior normal.        Thought Content: Thought content normal.        Judgment: Judgment normal.     Results for orders placed or performed during  the hospital encounter of 30/16/01  Basic metabolic panel  Result Value Ref Range   Sodium 135 135 - 145 mmol/L   Potassium 4.1 3.5 - 5.1 mmol/L   Chloride 104 98 - 111 mmol/L   CO2 27 22 - 32 mmol/L   Glucose, Bld 99 70 - 99 mg/dL   BUN 10 8 - 23 mg/dL   Creatinine, Ser 0.79 0.44 - 1.00 mg/dL   Calcium 8.6 (L) 8.9 - 10.3 mg/dL   GFR, Estimated >60 >60 mL/min   Anion gap 4 (L) 5 - 15  CBC with Differential  Result Value Ref Range   WBC 3.5 (L) 4.0 - 10.5 K/uL   RBC 4.28 3.87 - 5.11 MIL/uL   Hemoglobin 11.8 (L) 12.0 - 15.0 g/dL   HCT 37.1 36.0 - 46.0 %   MCV 86.7 80.0 - 100.0 fL   MCH 27.6 26.0 - 34.0 pg   MCHC 31.8 30.0 - 36.0 g/dL   RDW 20.0 (H) 11.5 - 15.5 %   Platelets 274 150 - 400 K/uL   nRBC 0.0 0.0 - 0.2 %   Neutrophils Relative % 50 %   Neutro Abs 1.7 1.7 - 7.7 K/uL   Lymphocytes Relative 42 %   Lymphs Abs 1.5 0.7 - 4.0 K/uL   Monocytes Relative 7 %   Monocytes Absolute 0.3 0.1 - 1.0 K/uL   Eosinophils Relative 1 %   Eosinophils Absolute 0.0 0.0 - 0.5 K/uL   Basophils Relative 0 %   Basophils Absolute 0.0 0.0 - 0.1 K/uL   Immature Granulocytes 0 %   Abs Immature Granulocytes 0.00 0.00 - 0.07 K/uL  Sedimentation rate  Result Value Ref Range   Sed Rate 10 0 - 30 mm/hr      Assessment & Plan:   Problem List Items Addressed This Visit       Other   Memory change - Primary    Ongoing concern.  Has not seen Neurology.  Was started on Buspar by psychiatry and fell on Monday and Wednesday.  Also ran out of Xanax at this time for about 5 days.  UA , CMP, and CBC obtained today.  Highly recommend to patient that she call  her Neurology provider.  Patient has an MRI scheduled for March.  It has been delayed due to iron infusions.        Relevant Orders   Urinalysis, Routine w reflex microscopic   Comp Met (CMET)   CBC w/Diff     Follow up plan: No follow-ups on file.

## 2022-08-24 NOTE — Assessment & Plan Note (Signed)
Ongoing concern.  Has not seen Neurology.  Was started on Buspar by psychiatry and fell on Monday and Wednesday.  Also ran out of Xanax at this time for about 5 days.  UA , CMP, and CBC obtained today.  Highly recommend to patient that she call her Neurology provider.  Patient has an MRI scheduled for March.  It has been delayed due to iron infusions.

## 2022-08-25 LAB — COMPREHENSIVE METABOLIC PANEL
ALT: 50 IU/L — ABNORMAL HIGH (ref 0–32)
AST: 55 IU/L — ABNORMAL HIGH (ref 0–40)
Albumin/Globulin Ratio: 1.5 (ref 1.2–2.2)
Albumin: 4 g/dL (ref 3.9–4.9)
Alkaline Phosphatase: 204 IU/L — ABNORMAL HIGH (ref 44–121)
BUN/Creatinine Ratio: 6 — ABNORMAL LOW (ref 12–28)
BUN: 5 mg/dL — ABNORMAL LOW (ref 8–27)
Bilirubin Total: 0.3 mg/dL (ref 0.0–1.2)
CO2: 26 mmol/L (ref 20–29)
Calcium: 9.5 mg/dL (ref 8.7–10.3)
Chloride: 99 mmol/L (ref 96–106)
Creatinine, Ser: 0.84 mg/dL (ref 0.57–1.00)
Globulin, Total: 2.6 g/dL (ref 1.5–4.5)
Glucose: 106 mg/dL — ABNORMAL HIGH (ref 70–99)
Potassium: 4.3 mmol/L (ref 3.5–5.2)
Sodium: 138 mmol/L (ref 134–144)
Total Protein: 6.6 g/dL (ref 6.0–8.5)
eGFR: 77 mL/min/{1.73_m2} (ref >59)

## 2022-08-25 LAB — CBC WITH DIFFERENTIAL/PLATELET
Basophils Absolute: 0 10*3/uL (ref 0.0–0.2)
Basos: 0 %
EOS (ABSOLUTE): 0.1 10*3/uL (ref 0.0–0.4)
Eos: 1 %
Hematocrit: 40.4 % (ref 34.0–46.6)
Hemoglobin: 12.9 g/dL (ref 11.1–15.9)
Immature Grans (Abs): 0 10*3/uL (ref 0.0–0.1)
Immature Granulocytes: 0 %
Lymphocytes Absolute: 1.5 10*3/uL (ref 0.7–3.1)
Lymphs: 35 %
MCH: 27.5 pg (ref 26.6–33.0)
MCHC: 31.9 g/dL (ref 31.5–35.7)
MCV: 86 fL (ref 79–97)
Monocytes Absolute: 0.3 10*3/uL (ref 0.1–0.9)
Monocytes: 7 %
Neutrophils Absolute: 2.4 10*3/uL (ref 1.4–7.0)
Neutrophils: 57 %
Platelets: 306 10*3/uL (ref 150–450)
RBC: 4.69 x10E6/uL (ref 3.77–5.28)
RDW: 17.4 % — ABNORMAL HIGH (ref 11.7–15.4)
WBC: 4.2 10*3/uL (ref 3.4–10.8)

## 2022-08-27 ENCOUNTER — Other Ambulatory Visit: Payer: Self-pay

## 2022-08-27 DIAGNOSIS — N3001 Acute cystitis with hematuria: Secondary | ICD-10-CM | POA: Diagnosis not present

## 2022-08-27 DIAGNOSIS — R7989 Other specified abnormal findings of blood chemistry: Secondary | ICD-10-CM

## 2022-08-27 MED ORDER — NITROFURANTOIN MONOHYD MACRO 100 MG PO CAPS: 100.0000 mg | ORAL_CAPSULE | Freq: Two times a day (BID) | ORAL | 0 refills | Status: DC

## 2022-08-27 NOTE — Progress Notes (Signed)
Please let patient know that her urine came back with bacteria.  I have sent in a prescription for macrobid to take twice daily for 5 days.

## 2022-08-27 NOTE — Addendum Note (Signed)
Addended by: [name] on: 08/27/2022 09:00 AM   Modules accepted: Orders

## 2022-08-28 ENCOUNTER — Encounter: Payer: Self-pay | Admitting: Nurse Practitioner

## 2022-08-28 ENCOUNTER — Ambulatory Visit: Payer: BC Managed Care – PPO | Admitting: Nurse Practitioner

## 2022-08-28 VITALS — BP 119/80 | HR 80 | Temp 97.8°F | Wt 131.3 lb

## 2022-08-28 DIAGNOSIS — I5032 Chronic diastolic (congestive) heart failure: Secondary | ICD-10-CM

## 2022-08-28 DIAGNOSIS — K5100 Ulcerative (chronic) pancolitis without complications: Secondary | ICD-10-CM

## 2022-08-28 DIAGNOSIS — E1165 Type 2 diabetes mellitus with hyperglycemia: Secondary | ICD-10-CM

## 2022-08-28 DIAGNOSIS — I10 Essential (primary) hypertension: Secondary | ICD-10-CM | POA: Diagnosis not present

## 2022-08-28 DIAGNOSIS — D509 Iron deficiency anemia, unspecified: Secondary | ICD-10-CM

## 2022-08-28 DIAGNOSIS — E559 Vitamin D deficiency, unspecified: Secondary | ICD-10-CM

## 2022-08-28 DIAGNOSIS — E781 Pure hyperglyceridemia: Secondary | ICD-10-CM

## 2022-08-28 NOTE — Assessment & Plan Note (Signed)
>>  ASSESSMENT AND PLAN FOR DIABETES MELLITUS TYPE 2 IN NONOBESE (HCC) WRITTEN ON 08/28/2022  3:01 PM BY HOLDSWORTH, KAREN, NP  Chronic. Improving. Last A1c two weeks ago was 6.1%.  Sugars are running around 100.  Patient has elected to stay on the [facility].  Follow up in 3 month.  Call sooner if concerns arise.

## 2022-08-28 NOTE — Assessment & Plan Note (Signed)
Chronic.  Controlled.  Continue with current medication regimen.  Labs ordered today.  Return to clinic in 6 months for reevaluation.  Call sooner if concerns arise.  ? ?

## 2022-08-28 NOTE — Assessment & Plan Note (Signed)
Labs ordered at visit today.  Will make recommendations based on lab results.   

## 2022-08-28 NOTE — Assessment & Plan Note (Signed)
Chronic. Improved.  Has an Endoscopy scheduled next week.  Her Entyvio scheduled changed from every 8 weeks to every 4 to help control symptoms. Continue to follow up with GI.

## 2022-08-28 NOTE — Assessment & Plan Note (Signed)
Chronic. Improving. Last A1c two weeks ago was 6.1%.  Sugars are running around 100.  Patient has elected to stay on the [facility].  Follow up in 3 month.  Call sooner if concerns arise.

## 2022-08-28 NOTE — Assessment & Plan Note (Signed)
Chronic.  Controlled.  Continue with current medication regimen.  Labs ordered today.  Return to clinic in 1 months for reevaluation.  Call sooner if concerns arise.

## 2022-08-28 NOTE — Progress Notes (Signed)
BP 119/80   Pulse 80   Temp 97.8 F (36.6 C) (Oral)   Wt 131 lb 4.8 oz (59.6 kg)   LMP  (LMP Unknown)   SpO2 98%   BMI 25.64 kg/m    Subjective:    Patient ID: Amy Hansen, female    DOB: 28-Sep-1955, 67 y.o.   MRN: [id number]  HPI: Amy Hansen is a 67 y.o. female  Chief Complaint  Patient presents with   Depression   Diabetes   Hyperlipidemia   Hypertension   RESTLESS LEGS & FALL At last visit patient had fallen a couple of times.  She had a UTI and has started the [place].  Urine has been sent for culture.   Duration: chronic Discomfort description:  cramping Pain: yes Location: lower legs Bilateral: yes Symmetric: yes Severity: 10/10 Onset:  gradual Frequency:  intermittent Symptoms only occur while legs at rest: yes Sudden unintentional leg jerking: no Bed partner bothered by leg movements: no LE numbness: no Decreased sensation: no Weakness: no Insomnia: no Daytime somnolence: no Fatigue: no Alleviating factors: medication Aggravating factors: resting Status: fluctuating Treatments attempted:  Mirapex  DIABETES Last A1c 6.1% in January.  Her glucometer broke and she hasn't been able to check her sugar.  To be taking Metformin 1000 MG BID, Glipizide 5 MG BID, Trulicity 7.85 MG.   Hypoglycemic episodes: yes Polydipsia/polyuria: no Visual disturbance: no Chest pain: no Paresthesias: no Glucose Monitoring: yes  Accucheck frequency:   Fasting glucose: 80-300  Post prandial:  Evening:  Before meals: Taking Insulin?: no  Long acting insulin:  Short acting insulin: Blood Pressure Monitoring: not checking Retinal Examination: Up to Date -- Dr. Edison Pace ([place]) Foot Exam: Up to Date Pneumovax: Up to Date Influenza: Up to Date Aspirin: no   ANEMIA Anemia status: controlled Etiology of anemia: Duration of anemia treatment:  Compliance with treatment: excellent compliance Iron supplementation side effects: no Severity of anemia:  mild Fatigue: no Decreased exercise tolerance: no  Dyspnea on exertion: no Palpitations: no Bleeding: no Pica: no  MIGRAINES Duration: years Onset: gradual Severity: moderate Frequency:  3-4 x per month Headache status at time of visit: asymptomatic Treatments attempted: Treatments attempted: aleve", excedrine and triptans   Aura: yes Nausea:  yes Vomiting: yes Photophobia:  yes Phonophobia:  yes Effect on social functioning:  yes   Relevant past medical, surgical, family and social history reviewed and updated as indicated. Interim medical history since our last visit reviewed. Allergies and medications reviewed and updated.  Review of Systems  Constitutional:  Negative for activity change, appetite change, diaphoresis, fatigue and fever.  Respiratory:  Negative for cough, chest tightness and shortness of breath.   Cardiovascular:  Negative for chest pain, palpitations and leg swelling.  Gastrointestinal: Negative.   Endocrine: Negative for polydipsia, polyphagia and polyuria.  Neurological:  Negative for headaches.  Psychiatric/Behavioral:  Positive for dysphoric mood. Negative for suicidal ideas. The patient is nervous/anxious.     Per HPI unless specifically indicated above     Objective:    BP 119/80   Pulse 80   Temp 97.8 F (36.6 C) (Oral)   Wt 131 lb 4.8 oz (59.6 kg)   LMP  (LMP Unknown)   SpO2 98%   BMI 25.64 kg/m   Wt Readings from Last 3 Encounters:  08/28/22 131 lb 4.8 oz (59.6 kg)  08/24/22 127 lb 6.4 oz (57.8 kg)  08/19/22 132 lb (59.9 kg)    Physical Exam Vitals and nursing  note reviewed.  Constitutional:      General: She is awake. She is not in acute distress.    Appearance: Normal appearance. She is well-developed, well-groomed and normal weight. She is not ill-appearing, toxic-appearing or diaphoretic.  HENT:     Head: Normocephalic.     Right Ear: Hearing and external ear normal.     Left Ear: Hearing and external ear normal.      Nose: Nose normal.     Mouth/Throat:     Mouth: Mucous membranes are moist.     Pharynx: Oropharynx is clear.  Eyes:     General: Lids are normal.        Right eye: No discharge.        Left eye: No discharge.     Extraocular Movements: Extraocular movements intact.     Conjunctiva/sclera: Conjunctivae normal.     Pupils: Pupils are equal, round, and reactive to light.  Neck:     Thyroid: No thyromegaly.     Vascular: No carotid bruit.  Cardiovascular:     Rate and Rhythm: Normal rate and regular rhythm.     Heart sounds: Normal heart sounds. No murmur heard.    No gallop.  Pulmonary:     Effort: Pulmonary effort is normal. No accessory muscle usage or respiratory distress.     Breath sounds: Normal breath sounds. No wheezing or rales.  Abdominal:     General: Bowel sounds are normal.     Palpations: Abdomen is soft. There is no hepatomegaly or splenomegaly.  Musculoskeletal:     Cervical back: Normal range of motion and neck supple.     Right lower leg: No edema.     Left lower leg: No edema.  Skin:    General: Skin is warm and dry.     Capillary Refill: Capillary refill takes less than 2 seconds.  Neurological:     General: No focal deficit present.     Mental Status: She is alert and oriented to person, place, and time. Mental status is at baseline.     Deep Tendon Reflexes: Reflexes are normal and symmetric.     Reflex Scores:      Brachioradialis reflexes are 2+ on the right side and 2+ on the left side.      Patellar reflexes are 2+ on the right side and 2+ on the left side. Psychiatric:        Attention and Perception: Attention normal.        Mood and Affect: Mood normal.        Speech: Speech normal.        Behavior: Behavior normal. Behavior is cooperative.        Thought Content: Thought content normal.        Judgment: Judgment normal.    Results for orders placed or performed in visit on 08/24/22  IBC + Ferritin  Result Value Ref Range   Iron 101 42 -  145 ug/dL   Transferrin 200.0 (L) 212.0 - 360.0 mg/dL   Saturation Ratios 36.1 20.0 - 50.0 %   Ferritin 303.2 (H) 10.0 - 291.0 ng/mL   TIBC 280.0 250.0 - 450.0 mcg/dL  CBC w/Diff  Result Value Ref Range   WBC 5.1 4.0 - 10.5 K/uL   RBC 4.50 3.87 - 5.11 Mil/uL   Hemoglobin 12.7 12.0 - 15.0 g/dL   HCT 38.6 36.0 - 46.0 %   MCV 85.7 78.0 - 100.0 fl   MCHC 32.8 30.0 - 36.0  g/dL   RDW 20.1 (H) 11.5 - 15.5 %   Platelets 313.0 150.0 - 400.0 K/uL   Neutrophils Relative % 60.0 43.0 - 77.0 %   Lymphocytes Relative 32.7 12.0 - 46.0 %   Monocytes Relative 6.0 3.0 - 12.0 %   Eosinophils Relative 1.0 0.0 - 5.0 %   Basophils Relative 0.3 0.0 - 3.0 %   Neutro Abs 3.1 1.4 - 7.7 K/uL   Lymphs Abs 1.7 0.7 - 4.0 K/uL   Monocytes Absolute 0.3 0.1 - 1.0 K/uL   Eosinophils Absolute 0.1 0.0 - 0.7 K/uL   Basophils Absolute 0.0 0.0 - 0.1 K/uL      Assessment & Plan:   Problem List Items Addressed This Visit       Cardiovascular and Mediastinum   Hypertension    Chronic.  Controlled.  Continue with current medication regimen.  Labs ordered today.  Return to clinic in 1 months for reevaluation.  Call sooner if concerns arise.        Chronic heart failure with preserved ejection fraction (HCC) - Primary    Chronic.  Controlled.  Continue with current medication regimen.  Labs ordered today.  Return to clinic in 1 months for reevaluation.  Call sooner if concerns arise.  - Reminded to call for an overnight weight gain of >2 pounds or a weekly weight gain of >5 pounds - not adding salt to food and read food labels. Reviewed the importance of keeping daily sodium intake to '2000mg'$  daily. - Avoid Ibuprofen products.         Digestive   Pancolitis (HCC)    Chronic. Improved.  Has an Endoscopy scheduled next week.  Her Entyvio scheduled changed from every 8 weeks to every 4 to help control symptoms. Continue to follow up with GI.        Endocrine   Poorly controlled type 2 diabetes mellitus (HCC)     Chronic. Improving. Last A1c two weeks ago was 6.1%.  Sugars are running around 100.  Patient has elected to stay on the [facility].  Follow up in 3 month.  Call sooner if concerns arise.         Other   Vitamin D deficiency    Chronic.  Controlled.  Continue with current medication regimen.  Labs ordered today.  Return to clinic in 6 months for reevaluation.  Call sooner if concerns arise.        Iron deficiency anemia    Chronic.  Controlled.  Continue with current medication regimen.  Labs ordered today.  Return to clinic in 6 months for reevaluation.  Call sooner if concerns arise.         Hypertriglyceridemia    Labs ordered at visit today.  Will make recommendations based on lab results.           Follow up plan: Return in about 2 months (around 10/27/2022) for Fall Follow up.

## 2022-08-28 NOTE — Assessment & Plan Note (Signed)
Chronic.  Controlled.  Continue with current medication regimen.  Labs ordered today.  Return to clinic in 1 months for reevaluation.  Call sooner if concerns arise.  - Reminded to call for an overnight weight gain of >2 pounds or a weekly weight gain of >5 pounds - not adding salt to food and read food labels. Reviewed the importance of keeping daily sodium intake to '2000mg'$  daily. - Avoid Ibuprofen products.

## 2022-08-29 ENCOUNTER — Ambulatory Visit: Payer: BC Managed Care – PPO | Admitting: Internal Medicine

## 2022-08-29 ENCOUNTER — Encounter: Payer: Self-pay | Admitting: Internal Medicine

## 2022-08-29 ENCOUNTER — Other Ambulatory Visit: Payer: Self-pay

## 2022-08-29 VITALS — BP 139/91 | HR 111 | Temp 97.7°F | Wt 125.0 lb

## 2022-08-29 DIAGNOSIS — L239 Allergic contact dermatitis, unspecified cause: Secondary | ICD-10-CM | POA: Diagnosis not present

## 2022-08-29 NOTE — Progress Notes (Signed)
[facility]      Reason for Consult:cellulitis    Referring Physician: ED    Patient ID: Amy Hansen, female    DOB: 20-Jan-1956, 67 y.o.   MRN: [id number]  HPI:   Amy Hansen is here for evaluation of right arm cellulitis She describes having right arm erythema, significant warmth and tenderness and she was treated with oral antibiotics then in the ED received ceftriaxone and dalbavancin.  She has been using several topical treatments as well and noted redness changed and started to itch more.  No fever.    Past Medical History:  Diagnosis Date   Anemia    Anxiety    Arthritis    Blood transfusion without reported diagnosis    Cataract    CHF (congestive heart failure) (HCC)    Chronic kidney disease    UTI, hematuria in urine   Colitis    Crohn's disease ([place])    Depression    Diabetes ([place])    Diverticulosis    Frequent headaches    Interstitial cystitis    Recurrent UTI    Restless leg syndrome    TIA (transient ischemic attack)    more than year ago per pt 12-26-21   TIA (transient ischemic attack) 02/20/2021   Urinary frequency     Prior to Admission medications   Medication Sig Start Date End Date Taking? Authorizing Provider  ALPRAZolam Amy Moron) 1 MG tablet 1 q am, 1 in afternoon, 1/2 in evening 08/23/22  Yes Hansen, Amy Glassman, PA-C  aspirin EC 81 MG tablet Take 1 tablet (81 mg) by mouth once daily. Swallow whole.   Yes [provider]  buPROPion (WELLBUTRIN XL) 150 MG 24 hr tablet Take 3 tablets (450 mg total) by mouth daily. 11/30/21  Yes Donnal Moat T, PA-C  erythromycin ophthalmic ointment Place 1 Application into the left eye 2 (two) times daily. 08/11/22  Yes [provider]  furosemide (LASIX) 20 MG tablet Take 1 tablet (20 mg total) by mouth daily. May take an additional tablet ('20mg'$ ) AS NEEDED for weight gain and/or swelling. 08/13/22 08/08/23 Yes Dunn, Areta Haber, PA-C  lamoTRIgine (LAMICTAL) 200 MG tablet Take 2 tablets  (400 mg total) by mouth at bedtime. 03/01/22  Yes Donnal Moat T, PA-C  omeprazole (PRILOSEC) 40 MG capsule Omeprazole 40 mg once daily for 6 weeks 05/02/22  Yes Hansen, Amy Raspberry, MD  ondansetron (ZOFRAN-ODT) 8 MG disintegrating tablet Take 1 tablet (8 mg total) by mouth every 8 (eight) hours as needed for nausea or vomiting. 07/05/22  Yes Hansen, Amy Raspberry, MD  promethazine (PHENERGAN) 25 MG tablet Take 1 tablet (25 mg total) by mouth every 8 (eight) hours as needed for nausea or vomiting. 07/04/22  Yes Hansen, Amy Raspberry, MD  QUEtiapine (SEROQUEL) 300 MG tablet Take 1 tablet (300 mg total) by mouth at bedtime. 08/21/22  Yes Hansen, Amy Kelp T, PA-C  Rimegepant Sulfate (NURTEC) 75 MG TBDP Take 1 tablet by mouth daily as needed. 05/01/22  Yes [name], NP  rOPINIRole (REQUIP) 1 MG tablet Take 1 tablet by mouth twice daily 08/24/22  Yes [name], NP  rosuvastatin (CRESTOR) 5 MG tablet Take 1 tablet (5 mg total) by mouth daily. 06/01/22  Yes [name], NP  sertraline (ZOLOFT) 100 MG tablet Take 2 tablets by mouth once daily Patient taking differently: Take 100 mg by mouth daily. 05/01/22  Yes Hansen, Amy T, PA-C  tirzepatide [facility]) 5 MG/0.5ML Pen Inject 5 mg  into the skin once a week. 06/01/22  Yes [name], NP  vedolizumab (ENTYVIO) 300 MG injection Inject into the vein. Every 4 weeks   Yes [provider]  gabapentin (NEURONTIN) 100 MG capsule Take 200 mg by mouth 2 (two) times daily. Patient not taking: Reported on 08/29/2022 07/12/22   [provider]  mupirocin ointment (BACTROBAN) 2 % Apply 1 Application topically 2 (two) times daily. Patient not taking: Reported on 08/29/2022 08/07/22   [provider]  nitrofurantoin, macrocrystal-monohydrate, (MACROBID) 100 MG capsule Take 1 capsule (100 mg total) by mouth 2 (two) times daily. Patient not taking: Reported on 08/29/2022 08/27/22   [name], NP    Allergies  Allergen  Reactions   Avelox [Moxifloxacin Hcl In Nacl] Anaphylaxis   Bactrim [Sulfamethoxazole-Trimethoprim] Anaphylaxis   Ciprofloxacin Other (See Comments)    Pt states she was told never to take this as it is in the same family as Avelox.    Neomycin-Polymyxin-Dexameth Other (See Comments)    Burning in the eyes   Aquaphor [Lanolin-Petrolatum] Itching and Rash   Depakote [Divalproex Sodium] Other (See Comments)    Unknown Reaction   Imitrex [Sumatriptan] Other (See Comments)    Neck and shoulder pain   Stadol [Butorphanol] Rash    Social History   Tobacco Use   Smoking status: Former    Packs/day: 0.50    Types: Cigarettes    Quit date: 04/25/1975    Years since quitting: 47.3   Smokeless tobacco: Never   Tobacco comments:    quit 40 years  Vaping Use   Vaping Use: Never used  Substance Use Topics   Alcohol use: No    Alcohol/week: 0.0 standard drinks of alcohol   Drug use: No    Family History  Problem Relation Age of Onset   Colon cancer Mother    Stroke Father    Heart failure Sister    Bladder Cancer Neg Hx    Kidney disease Neg Hx    Prostate cancer Neg Hx    Kidney cancer Neg Hx    Pancreatic cancer Neg Hx    Esophageal cancer Neg Hx    Stomach cancer Neg Hx    Rectal cancer Neg Hx    Breast cancer Neg Hx     Review of Systems  Constitutional: negative for fevers and chills All other systems reviewed and are negative    Constitutional: in no apparent distress Respiratory: normal respiratory effort Musculoskeletal: right arm with palpable erythema, no warmth, no tenderness, dry and flaky appearance   Labs: Lab Results  Component Value Date   WBC 5.1 08/24/2022   HGB 12.7 08/24/2022   HCT 38.6 08/24/2022   MCV 85.7 08/24/2022   PLT 313.0 08/24/2022    Lab Results  Component Value Date   CREATININE 0.84 08/24/2022   BUN 5 (L) 08/24/2022   NA 138 08/24/2022   K 4.3 08/24/2022   CL 99 08/24/2022   CO2 26 08/24/2022    Lab Results  Component  Value Date   ALT 50 (H) 08/24/2022   AST 55 (H) 08/24/2022   ALKPHOS 204 (H) 08/24/2022   BILITOT 0.3 08/24/2022   INR 0.9 04/25/2020     Assessment: probable allergic dermatitis.  I suspect from some of her topical treatments and will avoid now. Try hydrocortisone cream twice daily for one week. Will take 1-2 weeks to resolve  Plan: 1)  hydrocortisone cream twice a day for 7 days Follow up as needed

## 2022-08-29 NOTE — Patient Instructions (Signed)
Try over the counter hydrocortisone cream twice a day for 7-10 days

## 2022-08-30 ENCOUNTER — Encounter: Payer: Self-pay | Admitting: Physician Assistant

## 2022-08-30 ENCOUNTER — Ambulatory Visit: Payer: BC Managed Care – PPO | Admitting: Physician Assistant

## 2022-08-30 DIAGNOSIS — F319 Bipolar disorder, unspecified: Secondary | ICD-10-CM | POA: Diagnosis not present

## 2022-08-30 DIAGNOSIS — F411 Generalized anxiety disorder: Secondary | ICD-10-CM | POA: Diagnosis not present

## 2022-08-30 DIAGNOSIS — F1320 Sedative, hypnotic or anxiolytic dependence, uncomplicated: Secondary | ICD-10-CM

## 2022-08-30 DIAGNOSIS — G4700 Insomnia, unspecified: Secondary | ICD-10-CM | POA: Diagnosis not present

## 2022-08-30 DIAGNOSIS — G2581 Restless legs syndrome: Secondary | ICD-10-CM

## 2022-08-30 MED ORDER — SERTRALINE HCL 100 MG PO TABS: 200.0000 mg | ORAL_TABLET | Freq: Every day | ORAL | 3 refills | Status: DC

## 2022-08-30 MED ORDER — ALPRAZOLAM 1 MG PO TABS: 1.0000 mg | ORAL_TABLET | Freq: Two times a day (BID) | ORAL | 0 refills | Status: DC | PRN

## 2022-08-30 NOTE — Progress Notes (Signed)
[facility]  Patient ID: Amy Hansen,  MRN: [id number]  PCP: [name], NP  Date of Evaluation: 08/30/2022  Time spent:20 minutes  Chief Complaint:  Chief Complaint   Anxiety; Follow-up    HISTORY/CURRENT STATUS: For routine med check.  Didn't tolerate the Buspar. Stated it made her fall (although had been falling before) and caused hallucinations. After stopping it, the hallucinations went away.  States she is doing well with weaning off the Xanax.  States she is determined to do it.  Not having panic attacks.  She does still get overwhelmed but it is not terrible most of the time.  The Xanax is still effective.  Has had no withdrawals.  She did increase the Zoloft, unsure if it is helping with anxiety or not though.  It has been too soon.  Patient is able to enjoy things.  Energy and motivation are good.  Work is going well.   No extreme sadness, tearfulness, or feelings of hopelessness.  Sleeps well most of the time. ADLs and personal hygiene are normal.   Denies any changes in concentration, making decisions, or remembering things.  Appetite has not changed.  Weight is stable. Denies suicidal or homicidal thoughts.  Patient denies increased energy with decreased need for sleep, increased talkativeness, racing thoughts, impulsivity or risky behaviors, increased spending, increased libido, grandiosity, increased irritability or anger, paranoia, or hallucinations.  Denies dizziness, syncope, seizures, numbness, tingling, tremor, tics, slurred speech, confusion. Denies muscle or joint pain, stiffness, or dystonia.Denies unexplained weight loss, frequent infections, or sores that heal slowly.  No polyphagia, polydipsia, or polyuria. Denies visual changes or paresthesias.  PCP follows labs.  Individual Medical History/ Review of Systems: Changes? :Yes     UTI since her last visit.  Past medications for mental health diagnoses include: Trazodone, Risperdal, Zoloft,  Lunesta, prazosin, Sonata, Prozac, Depakote, Lamictal, lithium, Wellbutrin, Xanax, Ambien, carbamazepine, Seroquel   Allergies: Avelox [moxifloxacin hcl in nacl], Bactrim [sulfamethoxazole-trimethoprim], Ciprofloxacin, Buspar [buspirone], Neomycin-polymyxin-dexameth, Aquaphor [lanolin-petrolatum], Depakote [divalproex sodium], Imitrex [sumatriptan], and Stadol [butorphanol]  Current Medications:  Current Outpatient Medications:    aspirin EC 81 MG tablet, Take 1 tablet (81 mg) by mouth once daily. Swallow whole., Disp: , Rfl:    buPROPion (WELLBUTRIN XL) 150 MG 24 hr tablet, Take 3 tablets (450 mg total) by mouth daily., Disp: 270 tablet, Rfl: 3   erythromycin ophthalmic ointment, Place 1 Application into the left eye 2 (two) times daily., Disp: , Rfl:    furosemide (LASIX) 20 MG tablet, Take 1 tablet (20 mg total) by mouth daily. May take an additional tablet ('20mg'$ ) AS NEEDED for weight gain and/or swelling., Disp: 90 tablet, Rfl: 3   lamoTRIgine (LAMICTAL) 200 MG tablet, Take 2 tablets (400 mg total) by mouth at bedtime., Disp: 60 tablet, Rfl: 11   omeprazole (PRILOSEC) 40 MG capsule, Omeprazole 40 mg once daily for 6 weeks, Disp: 30 capsule, Rfl: 1   ondansetron (ZOFRAN-ODT) 8 MG disintegrating tablet, Take 1 tablet (8 mg total) by mouth every 8 (eight) hours as needed for nausea or vomiting., Disp: 60 tablet, Rfl: 1   promethazine (PHENERGAN) 25 MG tablet, Take 1 tablet (25 mg total) by mouth every 8 (eight) hours as needed for nausea or vomiting., Disp: 20 tablet, Rfl: 0   QUEtiapine (SEROQUEL) 300 MG tablet, Take 1 tablet (300 mg total) by mouth at bedtime., Disp: 30 tablet, Rfl: 0   Rimegepant Sulfate (NURTEC) 75 MG TBDP, Take 1 tablet by mouth daily as needed., Disp:  10 tablet, Rfl: 0   rOPINIRole (REQUIP) 1 MG tablet, Take 1 tablet by mouth twice daily, Disp: 180 tablet, Rfl: 3   rosuvastatin (CRESTOR) 5 MG tablet, Take 1 tablet (5 mg total) by mouth daily., Disp: 90 tablet, Rfl: 1    tirzepatide (MOUNJARO) 5 MG/0.5ML Pen, Inject 5 mg into the skin once a week., Disp: 6 mL, Rfl: 1   vedolizumab (ENTYVIO) 300 MG injection, Inject into the vein. Every 4 weeks, Disp: , Rfl:    ALPRAZolam (XANAX) 1 MG tablet, Take 1 tablet (1 mg total) by mouth 2 (two) times daily as needed for anxiety., Disp: 28 tablet, Rfl: 0   gabapentin (NEURONTIN) 100 MG capsule, Take 200 mg by mouth 2 (two) times daily. (Patient not taking: Reported on 08/29/2022), Disp: , Rfl:    mupirocin ointment (BACTROBAN) 2 %, Apply 1 Application topically 2 (two) times daily. (Patient not taking: Reported on 08/29/2022), Disp: , Rfl:    nitrofurantoin, macrocrystal-monohydrate, (MACROBID) 100 MG capsule, Take 1 capsule (100 mg total) by mouth 2 (two) times daily. (Patient not taking: Reported on 08/29/2022), Disp: 10 capsule, Rfl: 0   sertraline (ZOLOFT) 100 MG tablet, Take 2 tablets (200 mg total) by mouth daily., Disp: 60 tablet, Rfl: 3 Medication Side Effects: none  Family Medical/ Social History: Changes?  no  MENTAL HEALTH EXAM:  There were no vitals taken for this visit.There is no height or weight on file to calculate BMI.  General Appearance: Casual, Neat, and Well Groomed  Eye Contact:  Good  Speech:  Clear and Coherent and Normal Rate  Volume:  Normal  Mood:  Euthymic  Affect:  Congruent  Thought Process:  Goal Directed and Descriptions of Associations: Circumstantial  Orientation:  Full (Time, Place, and Person)  Thought Content: Logical   Suicidal Thoughts:  No  Homicidal Thoughts:  No  Memory:  WNL  Judgement:  Good  Insight:  Good  Psychomotor Activity:  Normal  Concentration:  Concentration: Good and Attention Span: Good  Recall:  Good  Fund of Knowledge: Good  Language: Good  Assets:  Desire for Improvement Financial Resources/Insurance Housing Social Support Transportation  ADL's:  Intact  Cognition: WNL  Prognosis:  Good   PCP follows labs  DIAGNOSES:    ICD-10-CM   1. Bipolar  I disorder ([place])  F31.9     2. Benzodiazepine dependence (HCC)  F13.20     3. Generalized anxiety disorder  F41.1     4. Insomnia, unspecified type  G47.00     5. Restless legs syndrome (RLS)  G25.81       Receiving Psychotherapy: No   RECOMMENDATIONS:  PDMP reviewed.  Last Xanax was filled 08/23/2022.  I provided 20 minutes of face to face time during this encounter, including time spent before and after the visit in records review, medical decision making, counseling pertinent to today's visit, and charting.   She is doing well weaning off the Xanax.  Will continue to drop the dose every 2 weeks.  Weaning off Xanax. Only send 1-2 wks at a time   Decrease Xanax 1 mg to 1 p.o. twice daily for 2 weeks and then 1 p.o. every morning and 1/2 pill q. evening (or vice versa) until the next visit. Continue Wellbutrin XL 150 mg, 3 p.o. every morning. Continue Lamictal 200 mg, 1 p.o. twice daily. Continue Seroquel 300 mg, 1 p.o. nightly. Continue Requip 1 mg bid (PCP) Continue Zoloft  100 mg, 2 qd.  Continue Vit D  50,000 iu weekly. Return in 4 weeks.   Donnal Moat, PA-C

## 2022-08-30 NOTE — Patient Instructions (Signed)
Decrease the Xanax 1 mg to one by mouth twice a day for 2 weeks, then decrease to 1 pill every morning, and 1/2 pill at night for 2 weeks, until we see each other in a month.

## 2022-08-31 NOTE — Telephone Encounter (Signed)
Patient called would like to go over her lab results again with you because she is not sure why the provider is ordering a CT exam for her.

## 2022-08-31 NOTE — Telephone Encounter (Signed)
Returned call to patient. I informed patient that we did not order a CT scan, but we did order a RUQ [name] to further evaluate elevated liver enzymes. Pt verbalized understanding and had no concerns at the end of the call.

## 2022-09-01 LAB — URINE CULTURE

## 2022-09-03 DIAGNOSIS — M1711 Unilateral primary osteoarthritis, right knee: Secondary | ICD-10-CM | POA: Diagnosis not present

## 2022-09-04 NOTE — Progress Notes (Signed)
Please let patient know that due to her allergies and the bacteria that she grew in her urine she will have to be seen in the ER for IV antibiotics.  There is nothing I am able to give her outpatient to treat the bacteria.

## 2022-09-05 ENCOUNTER — Other Ambulatory Visit: Payer: Self-pay | Admitting: Physician Assistant

## 2022-09-06 ENCOUNTER — Ambulatory Visit (INDEPENDENT_AMBULATORY_CARE_PROVIDER_SITE_OTHER): Payer: BC Managed Care – PPO

## 2022-09-06 ENCOUNTER — Other Ambulatory Visit: Payer: Self-pay | Admitting: Physician Assistant

## 2022-09-06 VITALS — BP 163/95 | HR 99 | Temp 97.7°F | Resp 20 | Ht 60.0 in | Wt 123.4 lb

## 2022-09-06 DIAGNOSIS — E785 Hyperlipidemia, unspecified: Secondary | ICD-10-CM | POA: Diagnosis not present

## 2022-09-06 DIAGNOSIS — Z8673 Personal history of transient ischemic attack (TIA), and cerebral infarction without residual deficits: Secondary | ICD-10-CM | POA: Diagnosis not present

## 2022-09-06 DIAGNOSIS — I110 Hypertensive heart disease with heart failure: Secondary | ICD-10-CM | POA: Diagnosis not present

## 2022-09-06 DIAGNOSIS — K5100 Ulcerative (chronic) pancolitis without complications: Secondary | ICD-10-CM

## 2022-09-06 DIAGNOSIS — F319 Bipolar disorder, unspecified: Secondary | ICD-10-CM | POA: Diagnosis not present

## 2022-09-06 DIAGNOSIS — M542 Cervicalgia: Secondary | ICD-10-CM | POA: Diagnosis not present

## 2022-09-06 DIAGNOSIS — K219 Gastro-esophageal reflux disease without esophagitis: Secondary | ICD-10-CM | POA: Diagnosis not present

## 2022-09-06 DIAGNOSIS — M25552 Pain in left hip: Secondary | ICD-10-CM | POA: Diagnosis not present

## 2022-09-06 DIAGNOSIS — W01198A Fall on same level from slipping, tripping and stumbling with subsequent striking against other object, initial encounter: Secondary | ICD-10-CM | POA: Diagnosis not present

## 2022-09-06 DIAGNOSIS — Z79899 Other long term (current) drug therapy: Secondary | ICD-10-CM | POA: Diagnosis not present

## 2022-09-06 DIAGNOSIS — R531 Weakness: Secondary | ICD-10-CM | POA: Diagnosis not present

## 2022-09-06 DIAGNOSIS — R9431 Abnormal electrocardiogram [ECG] [EKG]: Secondary | ICD-10-CM | POA: Diagnosis not present

## 2022-09-06 DIAGNOSIS — N3000 Acute cystitis without hematuria: Secondary | ICD-10-CM | POA: Diagnosis not present

## 2022-09-06 DIAGNOSIS — K5090 Crohn's disease, unspecified, without complications: Secondary | ICD-10-CM | POA: Diagnosis not present

## 2022-09-06 DIAGNOSIS — I5032 Chronic diastolic (congestive) heart failure: Secondary | ICD-10-CM | POA: Diagnosis not present

## 2022-09-06 DIAGNOSIS — S0990XA Unspecified injury of head, initial encounter: Secondary | ICD-10-CM | POA: Diagnosis not present

## 2022-09-06 DIAGNOSIS — E119 Type 2 diabetes mellitus without complications: Secondary | ICD-10-CM | POA: Diagnosis not present

## 2022-09-06 DIAGNOSIS — M25511 Pain in right shoulder: Secondary | ICD-10-CM | POA: Diagnosis not present

## 2022-09-06 DIAGNOSIS — M533 Sacrococcygeal disorders, not elsewhere classified: Secondary | ICD-10-CM | POA: Diagnosis not present

## 2022-09-06 MED ORDER — VEDOLIZUMAB 300 MG IV SOLR
300.0000 mg | Freq: Once | INTRAVENOUS | Status: AC
  Administered 2022-09-06: 300 mg via INTRAVENOUS
  Filled 2022-09-06: qty 5

## 2022-09-06 NOTE — Progress Notes (Signed)
Diagnosis: Crohn's Disease  Provider:  Marshell Garfinkel MD  Procedure: Infusion  IV Type: Peripheral, IV Location: L Antecubital  Entyvio (Vedolizumab), Dose: 300 mg  Infusion Start Time: 1050  Infusion Stop Time: 1122  Post Infusion IV Care: Peripheral IV Discontinued  Discharge: Condition: Good, Destination: Home . AVS Provided  Performed by:  Koren Shiver, RN

## 2022-09-07 ENCOUNTER — Other Ambulatory Visit: Payer: Self-pay

## 2022-09-07 MED ORDER — ALPRAZOLAM 1 MG PO TABS: ORAL_TABLET | ORAL | 0 refills | Status: DC

## 2022-09-10 ENCOUNTER — Telehealth: Payer: Self-pay | Admitting: Physician Assistant

## 2022-09-10 NOTE — Telephone Encounter (Signed)
Pt called and advised that Helene Kelp is tryiihng to wean her off Xanax.  She said she thinks the decrease may have been too fast since she is sick, throwing up and having "the shakes".  She isn't taking any new meds.  She said Helene Kelp took her from 2.5 pills a day to 1.5 pills a day.  She thought she should go from 2.5 to 2 pills.  Next appt 2/29

## 2022-09-10 NOTE — Telephone Encounter (Signed)
Patient informed of the recommendations. Told her to let us know by Friday how she was doing. She will need a new Rx sent to cover the increase.

## 2022-09-10 NOTE — Telephone Encounter (Signed)
Per med order history: On 1/25 she was supposed to decrease to a total of 2.5 mg/d for 2 weeks. Then 2/1 ordered for 2.0 mg daily for 2 weeks Then 2/9 gave her enough for 1.5 mg daily. All should be in divided doses.  There were a few phone calls in between out last visits and that's caused some confusion I think.  If she's taking 1.5 now, have her go back to 2 mg daily (divided so 1 mg bid) for 2 weeks, then decrease to 1.5 mg daily.

## 2022-09-10 NOTE — Telephone Encounter (Signed)
Please see message and advise 

## 2022-09-11 ENCOUNTER — Encounter (HOSPITAL_COMMUNITY): Payer: Self-pay

## 2022-09-11 ENCOUNTER — Ambulatory Visit (HOSPITAL_COMMUNITY)
Admission: RE | Admit: 2022-09-11 | Discharge: 2022-09-11 | Disposition: A | Discharge status: Home / Self Care | Payer: BC Managed Care – PPO | Source: Ambulatory Visit | Attending: Gastroenterology | Admitting: Gastroenterology

## 2022-09-11 DIAGNOSIS — R7989 Other specified abnormal findings of blood chemistry: Secondary | ICD-10-CM

## 2022-09-12 ENCOUNTER — Encounter: Payer: Self-pay | Admitting: Nurse Practitioner

## 2022-09-12 ENCOUNTER — Ambulatory Visit: Payer: BC Managed Care – PPO | Admitting: Nurse Practitioner

## 2022-09-12 ENCOUNTER — Ambulatory Visit (HOSPITAL_COMMUNITY)
Admission: RE | Admit: 2022-09-12 | Discharge: 2022-09-12 | Disposition: A | Discharge status: Home / Self Care | Payer: BC Managed Care – PPO | Source: Ambulatory Visit | Attending: Gastroenterology | Admitting: Gastroenterology

## 2022-09-12 VITALS — BP 110/75 | HR 93 | Temp 98.0°F | Wt 120.1 lb

## 2022-09-12 DIAGNOSIS — K921 Melena: Secondary | ICD-10-CM

## 2022-09-12 DIAGNOSIS — R945 Abnormal results of liver function studies: Secondary | ICD-10-CM | POA: Diagnosis not present

## 2022-09-12 DIAGNOSIS — Z9049 Acquired absence of other specified parts of digestive tract: Secondary | ICD-10-CM | POA: Diagnosis not present

## 2022-09-12 DIAGNOSIS — W19XXXD Unspecified fall, subsequent encounter: Secondary | ICD-10-CM | POA: Diagnosis not present

## 2022-09-12 DIAGNOSIS — N3000 Acute cystitis without hematuria: Secondary | ICD-10-CM | POA: Diagnosis not present

## 2022-09-12 DIAGNOSIS — R7989 Other specified abnormal findings of blood chemistry: Secondary | ICD-10-CM | POA: Diagnosis not present

## 2022-09-12 MED ORDER — METHOCARBAMOL 500 MG PO TABS: 500.0000 mg | ORAL_TABLET | Freq: Three times a day (TID) | ORAL | 0 refills | Status: DC | PRN

## 2022-09-12 NOTE — Progress Notes (Signed)
BP 110/75   Pulse 93   Temp 98 F (36.7 C) (Oral)   Wt 120 lb 1.6 oz (54.5 kg)   LMP  (LMP Unknown)   SpO2 97%   BMI 23.46 kg/m    Subjective:    Patient ID: Amy Hansen, female    DOB: 1956-05-31, 67 y.o.   MRN: [id number]  HPI: Amy Hansen is a 67 y.o. female  Chief Complaint  Patient presents with   ER Follow Up    Pt states she was seen at [facility] 09/06/22 after having a fall. States she fell straight backwards and hit her head, states she had imaging done and all showed no fractures. States she is still having a lot of pain from the waist down. States she has a lot of pain in her hips down into her legs, L is worse than R.     Pt states she was seen at [facility] 09/06/22 after having a fall. States she fell straight backwards and hit her head, states she had imaging done and all showed no fractures. States she is still having a lot of pain from the waist down. States she has a lot of pain in her hips down into her legs, L is worse than R.  She has trouble getting up and sitting up in her bed or rolling over in the bed at night.    Patient is having an [name] of her liver today at 3.    She is having black tarry stools since Sunday.  Patient states she is having abdominal pain that started Sunday.  She had to take a fleet enema on Sunday.  Hasn't called the GI doctor.      Relevant past medical, surgical, family and social history reviewed and updated as indicated. Interim medical history since our last visit reviewed. Allergies and medications reviewed and updated.  Review of Systems  Gastrointestinal:  Positive for abdominal pain and constipation.       Black tarry stool  Musculoskeletal:  Positive for myalgias.    Per HPI unless specifically indicated above     Objective:    BP 110/75   Pulse 93   Temp 98 F (36.7 C) (Oral)   Wt 120 lb 1.6 oz (54.5 kg)   LMP  (LMP Unknown)   SpO2 97%   BMI 23.46 kg/m   Wt Readings from Last 3 Encounters:  09/12/22 120 lb 1.6 oz  (54.5 kg)  09/06/22 123 lb 6.4 oz (56 kg)  08/29/22 125 lb (56.7 kg)    Physical Exam Vitals and nursing note reviewed.  Constitutional:      General: She is not in acute distress.    Appearance: Normal appearance. She is normal weight. She is not ill-appearing, toxic-appearing or diaphoretic.  HENT:     Head: Normocephalic.     Right Ear: External ear normal.     Left Ear: External ear normal.     Nose: Nose normal.     Mouth/Throat:     Mouth: Mucous membranes are moist.     Pharynx: Oropharynx is clear.  Eyes:     General:        Right eye: No discharge.        Left eye: No discharge.     Extraocular Movements: Extraocular movements intact.     Conjunctiva/sclera: Conjunctivae normal.     Pupils: Pupils are equal, round, and reactive to light.  Cardiovascular:     Rate and Rhythm: Normal  rate and regular rhythm.     Heart sounds: No murmur heard. Pulmonary:     Effort: Pulmonary effort is normal. No respiratory distress.     Breath sounds: Normal breath sounds. No wheezing or rales.  Abdominal:     General: Abdomen is flat. Bowel sounds are normal. There is no distension.     Palpations: Abdomen is soft.     Tenderness: There is no abdominal tenderness. There is no right CVA tenderness, left CVA tenderness or guarding.  Musculoskeletal:     Cervical back: Normal range of motion and neck supple.  Skin:    General: Skin is warm and dry.     Capillary Refill: Capillary refill takes less than 2 seconds.  Neurological:     General: No focal deficit present.     Mental Status: She is alert and oriented to person, place, and time. Mental status is at baseline.  Psychiatric:        Mood and Affect: Mood normal.        Behavior: Behavior normal.        Thought Content: Thought content normal.        Judgment: Judgment normal.     Results for orders placed or performed in visit on 08/24/22  IBC + Ferritin  Result Value Ref Range   Iron 101 42 - 145 ug/dL   Transferrin  200.0 (L) 212.0 - 360.0 mg/dL   Saturation Ratios 36.1 20.0 - 50.0 %   Ferritin 303.2 (H) 10.0 - 291.0 ng/mL   TIBC 280.0 250.0 - 450.0 mcg/dL  CBC w/Diff  Result Value Ref Range   WBC 5.1 4.0 - 10.5 K/uL   RBC 4.50 3.87 - 5.11 Mil/uL   Hemoglobin 12.7 12.0 - 15.0 g/dL   HCT 38.6 36.0 - 46.0 %   MCV 85.7 78.0 - 100.0 fl   MCHC 32.8 30.0 - 36.0 g/dL   RDW 20.1 (H) 11.5 - 15.5 %   Platelets 313.0 150.0 - 400.0 K/uL   Neutrophils Relative % 60.0 43.0 - 77.0 %   Lymphocytes Relative 32.7 12.0 - 46.0 %   Monocytes Relative 6.0 3.0 - 12.0 %   Eosinophils Relative 1.0 0.0 - 5.0 %   Basophils Relative 0.3 0.0 - 3.0 %   Neutro Abs 3.1 1.4 - 7.7 K/uL   Lymphs Abs 1.7 0.7 - 4.0 K/uL   Monocytes Absolute 0.3 0.1 - 1.0 K/uL   Eosinophils Absolute 0.1 0.0 - 0.7 K/uL   Basophils Absolute 0.0 0.0 - 0.1 K/uL      Assessment & Plan:   Problem List Items Addressed This Visit   None Visit Diagnoses     Acute cystitis without hematuria    -  Primary   Received IV antibiotics in the ER. Will repeat urine at visit today.  Will make recommendations based on lab results.   Relevant Orders   Urinalysis, Routine w reflex microscopic   Urine Culture   Black stool       Stool sample ordered. Recommend calling GI.  Will make recommendations based on labs.   Relevant Orders   Fecal occult blood, imunochemical   Fall, subsequent encounter       Seen in the ER.  Imaging reviewed and unremarkable for injury. Will give methocarbamol to help with pain.  Follow up in 2 weeks.        Follow up plan: Return in about 2 weeks (around 09/26/2022) for Pain.

## 2022-09-13 ENCOUNTER — Ambulatory Visit (HOSPITAL_COMMUNITY): Payer: BC Managed Care – PPO

## 2022-09-14 ENCOUNTER — Ambulatory Visit: Payer: Self-pay

## 2022-09-14 LAB — MICROSCOPIC EXAMINATION
Casts: NONE SEEN /lpf
RBC, Urine: NONE SEEN /hpf (ref 0–2)

## 2022-09-14 LAB — URINALYSIS, ROUTINE W REFLEX MICROSCOPIC
Bilirubin, UA: NEGATIVE
Glucose, UA: NEGATIVE
Nitrite, UA: POSITIVE — AB
Protein,UA: NEGATIVE
RBC, UA: NEGATIVE
Specific Gravity, UA: 1.017 (ref 1.005–1.030)
Urobilinogen, Ur: 0.2 mg/dL (ref 0.2–1.0)
pH, UA: 5.5 (ref 5.0–7.5)

## 2022-09-14 LAB — URINE CULTURE

## 2022-09-14 NOTE — Telephone Encounter (Signed)
Called and LVM notifying patient of providers instructions. Tried contacting the patient's husband as well, no answer.   OK for the [facility] to give the patient Karen's message if she calls back.

## 2022-09-14 NOTE — Telephone Encounter (Signed)
If symptoms have escalated she needs to be seen in the ER.

## 2022-09-14 NOTE — Telephone Encounter (Signed)
Chief Complaint: confusion, difficulty coming up with the right words to express herself Symptoms: driving erratically, has fallen 4 times , talks "out of my head"doesn't understand what she is saying. Husband advised pt that she sometimes slurs her speech Frequency: last night Pertinent Negatives: Patient denies arm drift, vision disturbance weakness or numbness of face arms or legs Disposition: [x]$ ED /[]$ Urgent Care (no appt availability in office) / []$ Appointment(In office/virtual)/ []$  [facility]/ []$ Home Care/ []$ Refused Recommended Disposition /[]$  Mobile Bus/ []$  Follow-up with PCP Additional Notes: Unsure  if she will go - sent teams message and called x2 no answer  Reason for Disposition  Difficulty breathing  Answer Assessment - Initial Assessment Questions 1. SYMPTOM: "What is the main symptom you are concerned about?" (e.g., weakness, numbness)     Confused and when talks sometimes not understandable - talks "out of head" she doesn't know what she is saying 2. ONSET: "When did this start?" (minutes, hours, days; while sleeping)     Last night- driving erratic- EMS station 3. LAST NORMAL: "When was the last time you (the patient) were normal (no symptoms)?"     2 days ago 4. PATTERN "Does this come and go, or has it been constant since it started?"  "Is it present now?"     Constant since last night 5. CARDIAC SYMPTOMS: "Have you had any of the following symptoms: chest pain, difficulty breathing, palpitations?"     *No Answer* 6. NEUROLOGIC SYMPTOMS: "Have you had any of the following symptoms: headache, dizziness, vision loss, double vision, changes in speech, unsteady on your feet?"     Dizziness, stumble around having expressive aphasia, mouth  like cotton fell few weeks ago and slurred speech and trouble confusion fallen 4 times in weeks  7. OTHER SYMPTOMS: "Do you have any other symptoms?"     *No Answer*  Answer Assessment - Initial Assessment  Questions 1. DESCRIPTION: "Describe your dizziness."     *No Answer* 2. LIGHTHEADED: "Do you feel lightheaded?" (e.g., somewhat faint, woozy, weak upon standing)     *No Answer* 3. VERTIGO: "Do you feel like either you or the room is spinning or tilting?" (i.e. vertigo)     *No Answer* 4. SEVERITY: "How bad is it?"  "Do you feel like you are going to faint?" "Can you stand and walk?"   - MILD: Feels slightly dizzy, but walking normally.   - MODERATE: Feels unsteady when walking, but not falling; interferes with normal activities (e.g., school, work).   - SEVERE: Unable to walk without falling, or requires assistance to walk without falling; feels like passing out now.      Moderate has fallen 4 times past 2 weeks  5. ONSET:  "When did the dizziness begin?"     On going due to falls 6. AGGRAVATING FACTORS: "Does anything make it worse?" (e.g., standing, change in head position)     Standing walking 7. HEART RATE: "Can you tell me your heart rate?" "How many beats in 15 seconds?"  (Note: not all patients can do this)       *No Answer* 8. CAUSE: "What do you think is causing the dizziness?"     Stroke  9. RECURRENT SYMPTOM: "Have you had dizziness before?" If Yes, ask: "When was the last time?" "What happened that time?"     Yes during falls 10. OTHER SYMPTOMS: "Do you have any other symptoms?" (e.g., fever, chest pain, vomiting, diarrhea, bleeding)       Expressive aphasia,  confusion, and understanding information. Stated that she slurred speech per husband at times, forgetful, husband is fed up going to hospital a lot. 11. PREGNANCY: "Is there any chance you are pregnant?" "When was your last menstrual period?"       *No Answer*  Protocols used: Neurologic Deficit-A-AH, Dizziness - Lightheadedness-A-AH

## 2022-09-17 ENCOUNTER — Other Ambulatory Visit: Payer: Self-pay

## 2022-09-17 ENCOUNTER — Telehealth: Payer: Self-pay

## 2022-09-17 MED ORDER — ALPRAZOLAM 1 MG PO TABS: ORAL_TABLET | ORAL | 0 refills | Status: DC

## 2022-09-17 NOTE — Telephone Encounter (Signed)
Pended.

## 2022-09-17 NOTE — Telephone Encounter (Signed)
Pls pend  1 mg, #14, 1 po bid, no RF. Thanks

## 2022-09-17 NOTE — Telephone Encounter (Signed)
Pt returned call to [place].  please call her back [phone]

## 2022-09-17 NOTE — Progress Notes (Signed)
Good Morning Amy Hansen.  Your urine culture didn't grow any bacteria which is great.  No need for further antibiotic treatment.

## 2022-09-17 NOTE — Telephone Encounter (Signed)
Called and LVM asking for patient to please return my call.  

## 2022-09-19 ENCOUNTER — Ambulatory Visit: Payer: BC Managed Care – PPO | Admitting: Physician Assistant

## 2022-09-19 VITALS — BP 123/80 | HR 103 | Ht 60.0 in | Wt 123.0 lb

## 2022-09-19 DIAGNOSIS — Z87440 Personal history of urinary (tract) infections: Secondary | ICD-10-CM

## 2022-09-19 DIAGNOSIS — N390 Urinary tract infection, site not specified: Secondary | ICD-10-CM

## 2022-09-19 DIAGNOSIS — R300 Dysuria: Secondary | ICD-10-CM

## 2022-09-19 DIAGNOSIS — R350 Frequency of micturition: Secondary | ICD-10-CM

## 2022-09-19 DIAGNOSIS — R3915 Urgency of urination: Secondary | ICD-10-CM

## 2022-09-19 DIAGNOSIS — R319 Hematuria, unspecified: Secondary | ICD-10-CM | POA: Diagnosis not present

## 2022-09-19 LAB — URINALYSIS, COMPLETE
Bilirubin, UA: NEGATIVE
Glucose, UA: NEGATIVE
Ketones, UA: NEGATIVE
Nitrite, UA: POSITIVE — AB
Protein,UA: NEGATIVE
Specific Gravity, UA: 1.01 (ref 1.005–1.030)
Urobilinogen, Ur: 0.2 mg/dL (ref 0.2–1.0)
pH, UA: 5.5 (ref 5.0–7.5)

## 2022-09-19 LAB — MICROSCOPIC EXAMINATION: WBC, UA: >30 /hpf — AB (ref 0–5)

## 2022-09-19 MED ORDER — METHENAMINE HIPPURATE 1 G PO TABS: 1.0000 g | ORAL_TABLET | Freq: Two times a day (BID) | ORAL | 11 refills | Status: DC

## 2022-09-19 MED ORDER — FOSFOMYCIN TROMETHAMINE 3 G PO PACK: 3.0000 g | PACK | Freq: Once | ORAL | 0 refills | Status: AC

## 2022-09-19 MED ORDER — PREMARIN 0.625 MG/GM VA CREA: TOPICAL_CREAM | VAGINAL | 4 refills | Status: DC

## 2022-09-19 NOTE — Progress Notes (Unsigned)
09/19/2022 3:07 PM   KERYN TEUTSCH 04/24/1956 [id number]  CC: Chief Complaint  Patient presents with   Follow-up   HPI: Amy Hansen is a 67 y.o. female with PMH recurrent UTI previously on suppressive Macrobid, IC, and hematuria who presents today for evaluation of possible UTI.    Her last visit in our clinic was on 11/07/2020 for the same. She has been treated for frequent UTIs by her PCP in the interim.  She sought care at the [facility] ED on 09/06/2022 after a fall.  Workup was reassuring for brain bleed or fracture.  At the time she reported having been recently treated for a UTI with Macrobid but having persistent dysuria.  She was treated with 1 dose of IV gentamicin prior to discharge with concerns for incompletely treated UTI.  Urine culture was repeated at that time, which grew multidrug-resistant Klebsiella aerogenes.   Today she reports symptoms did not improve after receiving gentamicin.  She continues to experience dysuria, urgency, and frequency as well as her typical baseline nausea/vomiting which is being managed by GI.  She continues to use vaginal estrogen cream and states overall this has helped her.  She denies fevers or chills.  She reports nocturia x 3 and states she does have relief with voiding.  In-office catheterized UA today positive for trace intact blood, nitrites, and 3+ leukocytes; urine microscopy with >30 WBCs/HPF, 3-10 RBCs/HPF, and many bacteria.   PMH: Past Medical History:  Diagnosis Date   Anemia    Anxiety    Arthritis    Blood transfusion without reported diagnosis    Cataract    CHF (congestive heart failure) (HCC)    Chronic kidney disease    UTI, hematuria in urine   Colitis    Crohn's disease ([place])    Depression    Diabetes ([place])    Diverticulosis    Frequent headaches    Interstitial cystitis    Recurrent UTI    Restless leg syndrome    TIA (transient ischemic attack)    more than year ago per pt 12-26-21   TIA  (transient ischemic attack) 02/20/2021   Urinary frequency     Surgical History: Past Surgical History:  Procedure Laterality Date   bariatric bypass  2012   BIOPSY  05/03/2020   Procedure: BIOPSY;  Surgeon: Milus Banister, MD;  Location: [place];  Service: Endoscopy;;   CARPAL TUNNEL RELEASE Right 2003   CARPAL TUNNEL RELEASE Right    2008   CHOLECYSTECTOMY  1975   COLONOSCOPY     COLONOSCOPY WITH PROPOFOL N/A 05/03/2020   Procedure: COLONOSCOPY WITH PROPOFOL;  Surgeon: Milus Banister, MD;  Location: [place];  Service: Endoscopy;  Laterality: N/A;   CYSTOSCOPY W/ RETROGRADES Bilateral 06/06/2015   Procedure: CYSTOSCOPY WITH RETROGRADE PYELOGRAM;  Surgeon: Festus Aloe, MD;  Location: [facility];  Service: Urology;  Laterality: Bilateral;   EYE SURGERY Left 07/06/2022   FL INJ LEFT KNEE CT ARTHROGRAM ([facility] HX) Left    1995   GASTRIC BYPASS  2010   HAND SURGERY Left 01/19/2021   [place]   HEMORRHOID SURGERY  2013   KNEE ARTHROSCOPY Left 1996   TONSILLECTOMY     TOTAL ABDOMINAL HYSTERECTOMY W/ BILATERAL SALPINGOOPHORECTOMY      Home Medications:  Allergies as of 09/19/2022       Reactions   Avelox [moxifloxacin Hcl In Nacl] Anaphylaxis   Bactrim [sulfamethoxazole-trimethoprim] Anaphylaxis   Ciprofloxacin Other (See Comments)   Pt  states she was told never to take this as it is in the same family as Avelox.    Buspar [buspirone] Other (See Comments)   hallucinations   Neomycin-polymyxin-dexameth Other (See Comments)   Burning in the eyes   Aquaphor [lanolin-petrolatum] Itching, Rash   Depakote [divalproex Sodium] Other (See Comments)   Unknown Reaction   Imitrex [sumatriptan] Other (See Comments)   Neck and shoulder pain   Stadol [butorphanol] Rash        Medication List        Accurate as of September 19, 2022  3:07 PM. If you have any questions, ask your nurse or doctor.          ALPRAZolam 1 MG tablet Commonly known as: XANAX Take 1 tablet  twice a day   aspirin EC 81 MG tablet Take 1 tablet (81 mg) by mouth once daily. Swallow whole.   buPROPion 150 MG 24 hr tablet Commonly known as: WELLBUTRIN XL Take 3 tablets (450 mg total) by mouth daily.   busPIRone 5 MG tablet Commonly known as: BUSPAR Take 5 mg by mouth 2 (two) times daily.   erythromycin ophthalmic ointment Place 1 Application into the left eye 2 (two) times daily.   furosemide 20 MG tablet Commonly known as: LASIX Take 1 tablet (20 mg total) by mouth daily. May take an additional tablet (58m) AS NEEDED for weight gain and/or swelling.   gabapentin 100 MG capsule Commonly known as: NEURONTIN Take 200 mg by mouth 2 (two) times daily.   lamoTRIgine 200 MG tablet Commonly known as: LAMICTAL Take 2 tablets (400 mg total) by mouth at bedtime.   methocarbamol 500 MG tablet Commonly known as: ROBAXIN Take 1 tablet (500 mg total) by mouth every 8 (eight) hours as needed for muscle spasms.   mupirocin ointment 2 % Commonly known as: BACTROBAN Apply 1 Application topically 2 (two) times daily.   Nurtec 75 MG Tbdp Generic drug: Rimegepant Sulfate Take 1 tablet by mouth daily as needed.   omeprazole 40 MG capsule Commonly known as: PRILOSEC Omeprazole 40 mg once daily for 6 weeks   ondansetron 8 MG disintegrating tablet Commonly known as: ZOFRAN-ODT Take 1 tablet (8 mg total) by mouth every 8 (eight) hours as needed for nausea or vomiting.   promethazine 25 MG tablet Commonly known as: PHENERGAN Take 1 tablet (25 mg total) by mouth every 8 (eight) hours as needed for nausea or vomiting.   QUEtiapine 300 MG tablet Commonly known as: SEROQUEL Take 1 tablet (300 mg total) by mouth at bedtime.   rOPINIRole 1 MG tablet Commonly known as: REQUIP Take 1 tablet by mouth twice daily   rosuvastatin 5 MG tablet Commonly known as: Crestor Take 1 tablet (5 mg total) by mouth daily.   sertraline 100 MG tablet Commonly known as: ZOLOFT Take 2 tablets  (200 mg total) by mouth daily.   tirzepatide 5 MG/0.5ML Pen Commonly known as: MOUNJARO Inject 5 mg into the skin once a week.   vedolizumab 300 MG injection Commonly known as: ENTYVIO Inject into the vein. Every 4 weeks        Allergies:  Allergies  Allergen Reactions   Avelox [Moxifloxacin Hcl In Nacl] Anaphylaxis   Bactrim [Sulfamethoxazole-Trimethoprim] Anaphylaxis   Ciprofloxacin Other (See Comments)    Pt states she was told never to take this as it is in the same family as Avelox.    Buspar [Buspirone] Other (See Comments)    hallucinations   Neomycin-Polymyxin-Dexameth Other (  See Comments)    Burning in the eyes   Aquaphor [Lanolin-Petrolatum] Itching and Rash   Depakote [Divalproex Sodium] Other (See Comments)    Unknown Reaction   Imitrex [Sumatriptan] Other (See Comments)    Neck and shoulder pain   Stadol [Butorphanol] Rash    Family History: Family History  Problem Relation Age of Onset   Colon cancer Mother    Stroke Father    Heart failure Sister    Bladder Cancer Neg Hx    Kidney disease Neg Hx    Prostate cancer Neg Hx    Kidney cancer Neg Hx    Pancreatic cancer Neg Hx    Esophageal cancer Neg Hx    Stomach cancer Neg Hx    Rectal cancer Neg Hx    Breast cancer Neg Hx     Social History:   reports that she quit smoking about 47 years ago. Her smoking use included cigarettes. She smoked an average of .5 packs per day. She has never used smokeless tobacco. She reports that she does not drink alcohol and does not use drugs.  Physical Exam: BP 123/80   Pulse (!) 103   Ht 5' (1.524 m)   Wt 123 lb (55.8 kg)   LMP  (LMP Unknown)   BMI 24.02 kg/m   Constitutional:  Alert and oriented, no acute distress, nontoxic appearing HEENT: [place], AT Cardiovascular: No clubbing, cyanosis, or edema Respiratory: Normal respiratory effort, no increased work of breathing Skin: No rashes, bruises or suspicious lesions Neurologic: Grossly intact, no focal  deficits, moving all 4 extremities Psychiatric: Normal mood and affect  Laboratory Data: Results for orders placed or performed in visit on 09/19/22  Microscopic Examination   Urine  Result Value Ref Range   WBC, UA >30 (A) 0 - 5 /hpf   RBC, Urine 3-10 (A) 0 - 2 /hpf   Epithelial Cells (non renal) 0-10 0 - 10 /hpf   Bacteria, UA Many (A) None seen/Few  Urinalysis, Complete  Result Value Ref Range   Specific Gravity, UA 1.010 1.005 - 1.030   pH, UA 5.5 5.0 - 7.5   Color, UA Yellow Yellow   Appearance Ur Hazy (A) Clear   Leukocytes,UA 3+ (A) Negative   Protein,UA Negative Negative/Trace   Glucose, UA Negative Negative   Ketones, UA Negative Negative   RBC, UA Trace (A) Negative   Bilirubin, UA Negative Negative   Urobilinogen, Ur 0.2 0.2 - 1.0 mg/dL   Nitrite, UA Positive (A) Negative   Microscopic Examination See below:    Assessment & Plan:   1. Recurrent UTI Complex urologic patient with a history of recurrent UTIs, IC, and hematuria who has been lost to follow-up for the last 2 years and presents today with continued frequent UTIs.  She continues to frequently grow increasingly resistant Klebsiella species, likely due to frequent antibiotic use.  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  Unfortunately, she is already on multiple other serotonergic agents, so I do not feel starting amitriptyline would be safe due to the risk for serotonin syndrome.  Will treat for possible UTI today with 1 dose of fosfomycin and switch her to twice daily Hiprex.  We discussed that my goal and starting Hiprex is to reduce her frequency of infection with a urinary antiseptic so that we can reduce her frequency of antibiotic use and possibly reverse some of her antibacterial resistance.  We discussed that if her symptoms do not improve on this therapy, then this furthers my suspicion that she may be  colonized and we need to turn our attention to her IC management instead.  She expressed understanding. - Urinalysis, Complete - conjugated estrogens (PREMARIN) vaginal cream; Apply one pea-sized amount around the opening of the urethra daily for 2 weeks, then 3 times weekly moving forward.  Dispense: 30 g; Refill: 4 - fosfomycin (MONUROL) 3 g PACK; Take 3 g by mouth once for 1 dose.  Dispense: 3 g; Refill: 0 - methenamine (HIPREX) 1 g tablet; Take 1 tablet (1 g total) by mouth 2 (two) times daily with a meal.  Dispense: 30 tablet; Refill: 11 - CULTURE, URINE COMPREHENSIVE   Return in about 6 weeks (around 10/31/2022) for Symptom recheck.  Debroah Loop, PA-C  [facility] [address] [place], [place] [address] [phone]

## 2022-09-19 NOTE — Patient Instructions (Signed)
Take the fosfomycin today or tomorrow to treat a UTI. The day after you take the fosfomycin, start the Hiprex for UTI prevention.

## 2022-09-20 ENCOUNTER — Ambulatory Visit: Payer: Self-pay | Admitting: *Deleted

## 2022-09-20 ENCOUNTER — Other Ambulatory Visit: Payer: Self-pay | Admitting: Nurse Practitioner

## 2022-09-20 NOTE — Telephone Encounter (Signed)
Requested medication (s) are due for refill today: yes  Requested medication (s) are on the active medication list: yes  Last refill:  09/12/22  Future visit scheduled: yes  Notes to clinic:  Unable to refill per protocol, cannot delegate.      Requested Prescriptions  Pending Prescriptions Disp Refills   methocarbamol (ROBAXIN) 500 MG tablet [Pharmacy Med Name: Methocarbamol 500 MG Oral Tablet] 30 tablet 0    Sig: TAKE 1 TABLET BY MOUTH EVERY 8 HOURS AS NEEDED FOR MUSCLE SPASM     Not Delegated - Analgesics:  Muscle Relaxants Failed - 09/20/2022  9:34 AM      Failed - This refill cannot be delegated      Passed - Valid encounter within last 6 months    Recent Outpatient Visits           1 week ago Acute cystitis without hematuria   [place], NP   3 weeks ago Chronic heart failure with preserved ejection fraction [facility])   [place] [name], NP   3 weeks ago Memory change   [place], NP   1 month ago Cellulitis of right upper extremity   [place], [place], PA-C   1 month ago Poorly controlled type 2 diabetes mellitus ([place])   [place], Dani [place], PA-C       Future Appointments             In 6 days [name], NP [place], [place]   In 1 month [name], NP [place], [place]   In 1 month [place], [place]

## 2022-09-20 NOTE — Telephone Encounter (Signed)
Before pt could be transferred to me the line must have disconnected.  It looked like she was still on the line but I could not get a response after repeated attempts of calling her name.  She was requesting pain medication per the agent.   She had a fall on 09/06/2022 and did get care afterward in the ED.    Still having pain.  I did send a message to [facility] to [name], NP.

## 2022-09-20 NOTE — Telephone Encounter (Signed)
  Chief Complaint: pain from a fall 09/06/2022.   Symptoms: Continued pain.   They are ruling out her having TIA's.   For an MRI.    Also had a UTI when she fell.  Frequency: pain since a fall on 09/06/2022 Pertinent Negatives: Patient denies N/A Disposition: []$ ED /[]$ Urgent Care (no appt availability in office) / [x]$ Appointment(In office/virtual)/ []$  [place] [facility]/ []$ Home Care/ []$ Refused Recommended Disposition /[]$ [place]/ []$  Follow-up with PCP Additional Notes: I called into [facility] and spoke with Iris.   She is going to check with [name] about seeing pt. Sooner.   They will call pt. Back.     Pt was agreeable to this plan.  I let pt know to return to the ED if she became dizzy, confused, speech slurred, falls again or becomes worse.    She verbalized understanding as she is having problems with processing and remembering things also with saying what she wants to say correctly since the fall.

## 2022-09-20 NOTE — Telephone Encounter (Signed)
Pt called in a few minutes ago but the line disconnected or something.    I could not hear her.    Pt called back in but due to system issues the agent wasn't able to transfer her to the triage nurses.  I called her back.     Reason for Disposition  [1] MODERATE weakness (i.e., interferes with work, school, normal activities) AND [2] new-onset or worsening  Answer Assessment - Initial Assessment Questions 1. MECHANISM: "How did the fall happen?"     I fell.    I was on a wooden floor and I don't know what happened.   I'm having mini strokes.    I slipped and fell backward hitting my head on the floor   I need to ED at [facility].   They did an xray and CT scan of my head and everything was fine.    I fell last Tues. Or Thur.   My granddaughter took me to the ED.     I've had 4 falls in 3 weeks.    I'm in touch with my neurologist.   I'm for a MRI March 26 to see what is going on with the mini strokes. Some of my medications are being changed.   Xanax is being weaned off for anxiety.    It's being cut back.   It's been hard.   Hopefully I can get through this without going to rehab to go through withdrawal.   I'm having a hard time remembering and speaking, confusion.    I had a UTI. I saw [name], NP and she said I had a UTI.   She sent me to the hospital.     They did an IV infusion of antibiotics.    I had the UTI before the fall.    I was very confused for a week and speech and thinking were not good.    It may be the UTI that contributed to the fall.   But they also think I'm having mini strokes.    I'm calling today because I need a refill on the muscle relaxer.    I don't know  the name of it.   It starts with an M. 2. DOMESTIC VIOLENCE AND ELDER ABUSE SCREENING: "Did you fall because someone pushed you or tried to hurt you?" If Yes, ask: "Are you safe now?"     N/A 3. ONSET: "When did the fall happen?" (e.g., minutes, hours, or days ago)     Last Tues. Or Thur. 4. LOCATION:  "What part of the body hit the ground?" (e.g., back, buttocks, head, hips, knees, hands, head, stomach)     Head    It affected the left side of my body when I fell.   My left buttocks took the brunt of the fall.   I can't hardly bend over to pick up anything due to the pain. 5. INJURY: "Did you hurt (injure) yourself when you fell?" If Yes, ask: "What did you injure? Tell me more about this?" (e.g., body area; type of injury; pain severity)"     Yes    See above 6. PAIN: "Is there any pain?" If Yes, ask: "How bad is the pain?" (e.g., Scale 1-10; or mild,  moderate, severe)   - NONE (0): No pain   - MILD (1-3): Doesn't interfere with normal activities    - MODERATE (4-7): Interferes with normal activities or awakens from sleep    - SEVERE (8-10):  Excruciating pain, unable to do any normal activities      Severe pain 7. SIZE: For cuts, bruises, or swelling, ask: "How large is it?" (e.g., inches or centimeters)      N/A 8. PREGNANCY: "Is there any chance you are pregnant?" "When was your last menstrual period?"     N/A due to age 67. OTHER SYMPTOMS: "Do you have any other symptoms?" (e.g., dizziness, fever, weakness; new onset or worsening).      See above 10. CAUSE: "What do you think caused the fall (or falling)?" (e.g., tripped, dizzy spell)       UTI or a mini stroke  Protocols used: Falls and [facility]

## 2022-09-20 NOTE — Telephone Encounter (Signed)
Okay to wait until next week for an appt.

## 2022-09-21 NOTE — Telephone Encounter (Signed)
Patient can come in today and see Erin.

## 2022-09-21 NOTE — Telephone Encounter (Signed)
Requested medication (s) are due for refill today: yes  Requested medication (s) are on the active medication list: yes  Last refill:  09/20/22 #30  Future visit scheduled: yes  Notes to clinic:  NT not delegated to refuse non delegated meds   Requested Prescriptions  Pending Prescriptions Disp Refills   methocarbamol (ROBAXIN) 500 MG tablet [Pharmacy Med Name: Methocarbamol 500 MG Oral Tablet] 30 tablet 0    Sig: TAKE 1 TABLET BY MOUTH EVERY 8 HOURS AS NEEDED FOR MUSCLE SPASM     Not Delegated - Analgesics:  Muscle Relaxants Failed - 09/20/2022  8:06 PM      Failed - This refill cannot be delegated      Passed - Valid encounter within last 6 months    Recent Outpatient Visits           1 week ago Acute cystitis without hematuria   [place], NP   3 weeks ago Chronic heart failure with preserved ejection fraction [facility])   [place], NP   4 weeks ago Memory change   [place], Karen, NP   1 month ago Cellulitis of right upper extremity   [place], [place], PA-C   1 month ago Poorly controlled type 2 diabetes mellitus ([place])   [place], PA-C       Future Appointments             In 5 days [name], NP [place], [place]   In 1 month [name], NP [place], [place]   In 1 month Debroah [place], [place]

## 2022-09-22 ENCOUNTER — Other Ambulatory Visit: Payer: Self-pay | Admitting: Physician Assistant

## 2022-09-25 ENCOUNTER — Other Ambulatory Visit: Payer: BC Managed Care – PPO

## 2022-09-25 DIAGNOSIS — R634 Abnormal weight loss: Secondary | ICD-10-CM | POA: Diagnosis not present

## 2022-09-25 DIAGNOSIS — R112 Nausea with vomiting, unspecified: Secondary | ICD-10-CM | POA: Diagnosis not present

## 2022-09-25 DIAGNOSIS — K523 Indeterminate colitis: Secondary | ICD-10-CM

## 2022-09-25 NOTE — Progress Notes (Unsigned)
   LMP  (LMP Unknown)    Subjective:    Patient ID: NATHALIA HERMRECK, female    DOB: 03/18/1956, 67 y.o.   MRN: [id number]  HPI: KYMBERLIE DRAVIS is a 68 y.o. female  No chief complaint on file.   Relevant past medical, surgical, family and social history reviewed and updated as indicated. Interim medical history since our last visit reviewed. Allergies and medications reviewed and updated.  Review of Systems  Per HPI unless specifically indicated above     Objective:    LMP  (LMP Unknown)   Wt Readings from Last 3 Encounters:  09/19/22 123 lb (55.8 kg)  09/12/22 120 lb 1.6 oz (54.5 kg)  09/06/22 123 lb 6.4 oz (56 kg)    Physical Exam  Results for orders placed or performed in visit on 09/19/22  CULTURE, URINE COMPREHENSIVE   Specimen: Urine   UR  Result Value Ref Range   Urine Culture, Comprehensive Preliminary report (A)    Organism ID, Bacteria Gram negative rods (A)    Organism ID, Bacteria Comment (A)   Microscopic Examination   Urine  Result Value Ref Range   WBC, UA >30 (A) 0 - 5 /hpf   RBC, Urine 3-10 (A) 0 - 2 /hpf   Epithelial Cells (non renal) 0-10 0 - 10 /hpf   Bacteria, UA Many (A) None seen/Few  Urinalysis, Complete  Result Value Ref Range   Specific Gravity, UA 1.010 1.005 - 1.030   pH, UA 5.5 5.0 - 7.5   Color, UA Yellow Yellow   Appearance Ur Hazy (A) Clear   Leukocytes,UA 3+ (A) Negative   Protein,UA Negative Negative/Trace   Glucose, UA Negative Negative   Ketones, UA Negative Negative   RBC, UA Trace (A) Negative   Bilirubin, UA Negative Negative   Urobilinogen, Ur 0.2 0.2 - 1.0 mg/dL   Nitrite, UA Positive (A) Negative   Microscopic Examination See below:       Assessment & Plan:   Problem List Items Addressed This Visit   None    Follow up plan: No follow-ups on file.

## 2022-09-26 ENCOUNTER — Ambulatory Visit
Admission: RE | Admit: 2022-09-26 | Discharge: 2022-09-26 | Disposition: A | Discharge status: Home / Self Care | Payer: BC Managed Care – PPO | Attending: Nurse Practitioner | Admitting: Nurse Practitioner

## 2022-09-26 ENCOUNTER — Encounter: Payer: Self-pay | Admitting: Nurse Practitioner

## 2022-09-26 ENCOUNTER — Telehealth: Payer: Self-pay | Admitting: Physician Assistant

## 2022-09-26 ENCOUNTER — Ambulatory Visit: Payer: BC Managed Care – PPO | Admitting: Nurse Practitioner

## 2022-09-26 ENCOUNTER — Ambulatory Visit
Admission: RE | Admit: 2022-09-26 | Discharge: 2022-09-26 | Disposition: A | Discharge status: Home / Self Care | Payer: BC Managed Care – PPO | Source: Ambulatory Visit | Attending: Nurse Practitioner | Admitting: Nurse Practitioner

## 2022-09-26 VITALS — BP 120/75 | HR 103 | Temp 97.6°F | Wt 124.8 lb

## 2022-09-26 DIAGNOSIS — R0781 Pleurodynia: Secondary | ICD-10-CM | POA: Insufficient documentation

## 2022-09-26 DIAGNOSIS — N390 Urinary tract infection, site not specified: Secondary | ICD-10-CM

## 2022-09-26 LAB — CULTURE, URINE COMPREHENSIVE

## 2022-09-26 MED ORDER — METHOCARBAMOL 500 MG PO TABS: ORAL_TABLET | ORAL | 0 refills | Status: DC

## 2022-09-26 NOTE — Telephone Encounter (Signed)
Patient said she is unable to do a telehealth visit tomorrow due to cost/insurance deductible. She is currently scheduled for 3/28. She said she has her MRI on 3/26 and hopefully will have results back them. She is asking for a RF on Xanax. I know she has been weaning and she last filled on 2/20 for 14 tablets.   09/18/2022 09/17/2022 1  Alprazolam 1 Mg Tablet 14.00 7 [place]

## 2022-09-26 NOTE — Assessment & Plan Note (Signed)
Ongoing problem.  Reviewed Urology note.  Patient states she took the Fosfomycin packet but not able to pick up the Hiprex due to pharmacy being out of medication.  Plans to pick up medication today.  Endorses starting the premarin cream.  Urine culture from Urology grew pseudomonas.  Will check urine culture during visit to see if Fosfomycin cleared the bacteria.  Keep follow up appointment with Urology.

## 2022-09-26 NOTE — Telephone Encounter (Signed)
Pt needs in office apt. Ins don't cover virtual. Had apt 2/29. RS to 3/28 and on canc list.Weaning off Xanax and received bad new and needs RTC to advise Provider what news about. Asking for RF Xanax. [phone] CONTACT #

## 2022-09-27 ENCOUNTER — Telehealth: Payer: Self-pay | Admitting: Nurse Practitioner

## 2022-09-27 ENCOUNTER — Other Ambulatory Visit: Payer: Self-pay

## 2022-09-27 ENCOUNTER — Ambulatory Visit: Payer: BC Managed Care – PPO | Admitting: Physician Assistant

## 2022-09-27 ENCOUNTER — Other Ambulatory Visit: Payer: Self-pay | Admitting: Physician Assistant

## 2022-09-27 MED ORDER — ALPRAZOLAM 0.5 MG PO TABS: ORAL_TABLET | ORAL | 0 refills | Status: DC

## 2022-09-27 NOTE — Telephone Encounter (Signed)
Copied from [place]. Topic: General - Other >> Sep 27, 2022  2:07 PM [id number] J wrote: Reason for CRM: pt called in to make provider aware that she has spoken with her insurance and was told that PA for testing has been approved and that they will cover. Pt would like to proceed with testing (MRI)

## 2022-09-27 NOTE — Telephone Encounter (Signed)
Pended.

## 2022-09-27 NOTE — Telephone Encounter (Signed)
Pt came in the office and stated that she would be willing to pay out of pocket if the insurance still does not approve.  Pt does not want to change the date of the MRI that is already scheduled feels that is very important to have as soon as possible.  Pt would like to be called to let her know.

## 2022-09-27 NOTE — Telephone Encounter (Signed)
Can you check into this authorization for imaging please?

## 2022-09-27 NOTE — Telephone Encounter (Signed)
I've sent in a new Rx for 0.5 mg pills, take 1.5 pill q am (0.75 mg) and 1 pill (0.5 mg)  qhs. Will keep it that way for a month, till our next OV. Am denying this Rx.

## 2022-09-27 NOTE — Telephone Encounter (Signed)
Pt is calling to report that the insurance company is wanting Santiago Glad to contact them for the 2nd time for authorization for MRI brain w & w/o contrast. Please advise  [id number]

## 2022-09-27 NOTE — Telephone Encounter (Signed)
According to chart review, imaging has been approved. Is this correct?

## 2022-09-28 NOTE — Progress Notes (Signed)
No evidence of fracture.  Continue with the muscle relaxer to help with pain.

## 2022-09-29 LAB — URINE CULTURE

## 2022-09-30 LAB — CALPROTECTIN, FECAL: Calprotectin, Fecal: 59 ug/g (ref 0–120)

## 2022-10-01 ENCOUNTER — Other Ambulatory Visit: Payer: Self-pay | Admitting: Physician Assistant

## 2022-10-01 NOTE — Addendum Note (Signed)
Addended by: [name] on: 10/01/2022 02:47 PM   Modules accepted: Orders

## 2022-10-01 NOTE — Progress Notes (Signed)
Please let patient know that her urine grew another bacteria.  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

## 2022-10-02 NOTE — Progress Notes (Signed)
Please find out if patient is having UTI symptoms.  If so, I will treat her with doxycyline.  If she is not, keep her follow up with Urology.  I saw she did not want to see ID but I do recommend that she see them for further management.

## 2022-10-03 ENCOUNTER — Ambulatory Visit: Payer: BC Managed Care – PPO | Admitting: Physician Assistant

## 2022-10-03 ENCOUNTER — Encounter: Payer: Self-pay | Admitting: Physician Assistant

## 2022-10-03 DIAGNOSIS — F1320 Sedative, hypnotic or anxiolytic dependence, uncomplicated: Secondary | ICD-10-CM | POA: Diagnosis not present

## 2022-10-03 DIAGNOSIS — G2581 Restless legs syndrome: Secondary | ICD-10-CM

## 2022-10-03 DIAGNOSIS — F319 Bipolar disorder, unspecified: Secondary | ICD-10-CM

## 2022-10-03 DIAGNOSIS — F411 Generalized anxiety disorder: Secondary | ICD-10-CM

## 2022-10-03 DIAGNOSIS — Z79899 Other long term (current) drug therapy: Secondary | ICD-10-CM

## 2022-10-03 DIAGNOSIS — R2689 Other abnormalities of gait and mobility: Secondary | ICD-10-CM

## 2022-10-03 MED ORDER — SERTRALINE HCL 100 MG PO TABS: 250.0000 mg | ORAL_TABLET | Freq: Every day | ORAL | 0 refills | Status: DC

## 2022-10-03 NOTE — Progress Notes (Signed)
[facility] Check  Patient ID: Amy Hansen,  MRN: [id number]  PCP: [name], NP  Date of Evaluation: 10/03/2022  Time spent:30 minutes  Chief Complaint:  Chief Complaint   Anxiety; Follow-up    HISTORY/CURRENT STATUS: Anxiety f/u  Fell on 09/06/2022, hit head on wood floor. No LOC, no fx on xray, CT of head showed no acute abnormality, cervical spine no fx, states she's been falling more. Is seeing Neuro, MRI brain has been ordered and pending, then f/u with neuro after that.  She is very worried she will get Alzheimer's.  She is tolerating the slow weaning of Xanax fairly well.  She has had 1 panic attack since our last visit.  That came when one of her providers told her she might have dementia and needs to be evaluated for that.  Otherwise no panic attacks just anxiety in general.  States she has read that gabapentin can cause confusion and falls so she has not been taking it very often.  Is afraid to.  States she had hallucinations and falls when she took the [place], does not want to take it ever again.  Patient is able to enjoy things.  Energy and motivation are good.  Work is going well.   No extreme sadness, tearfulness, or feelings of hopelessness.  Sleeps well most of the time. ADLs and personal hygiene are normal.   Continues to have trouble w/ memory. No worse.  Appetite has not changed.  Weight is stable.   Denies suicidal or homicidal thoughts.  Patient denies increased energy with decreased need for sleep, increased talkativeness, racing thoughts, impulsivity or risky behaviors, increased spending, increased libido, grandiosity, increased irritability or anger, paranoia, or hallucinations.  Denies dizziness, syncope, seizures, numbness, tingling, tremor, tics, slurred speech, confusion. Denies muscle or joint pain, stiffness, or dystonia.Denies unexplained weight loss, frequent infections, or sores that heal slowly.  No polyphagia, polydipsia, or polyuria.  Denies visual changes or paresthesias.  PCP follows labs.  Individual Medical History/ Review of Systems: Changes? :Yes    see notes on chart and HPI  Past medications for mental health diagnoses include: Trazodone, Risperdal, Zoloft, Lunesta, prazosin, Sonata, Prozac, Depakote, Lamictal, lithium, Wellbutrin, Xanax, Ambien, carbamazepine, Seroquel, Buspar caused falls and hallucinations, Gabapentin-she doesn't want to take  Allergies: Avelox [moxifloxacin hcl in nacl], Bactrim [sulfamethoxazole-trimethoprim], Ciprofloxacin, Buspar [buspirone], Neomycin-polymyxin-dexameth, Aquaphor [lanolin-petrolatum], Depakote [divalproex sodium], Imitrex [sumatriptan], and Stadol [butorphanol]  Current Medications:  Current Outpatient Medications:    ALPRAZolam (XANAX) 0.5 MG tablet, 1.5 pills (0.75 mg) q am, and 1 pill (0.5 mg) qhs., Disp: 75 tablet, Rfl: 0   aspirin EC 81 MG tablet, Take 1 tablet (81 mg) by mouth once daily. Swallow whole., Disp: , Rfl:    buPROPion (WELLBUTRIN XL) 150 MG 24 hr tablet, Take 3 tablets (450 mg total) by mouth daily., Disp: 270 tablet, Rfl: 3   conjugated estrogens (PREMARIN) vaginal cream, Apply one pea-sized amount around the opening of the urethra daily for 2 weeks, then 3 times weekly moving forward., Disp: 30 g, Rfl: 4   erythromycin ophthalmic ointment, Place 1 Application into the left eye 2 (two) times daily., Disp: , Rfl:    furosemide (LASIX) 20 MG tablet, Take 1 tablet (20 mg total) by mouth daily. May take an additional tablet ('20mg'$ ) AS NEEDED for weight gain and/or swelling., Disp: 90 tablet, Rfl: 3   lamoTRIgine (LAMICTAL) 200 MG tablet, Take 2 tablets (400 mg total) by mouth at bedtime., Disp: 60 tablet, Rfl: 11  methenamine (HIPREX) 1 g tablet, Take 1 tablet (1 g total) by mouth 2 (two) times daily with a meal., Disp: 30 tablet, Rfl: 11   methocarbamol (ROBAXIN) 500 MG tablet, TAKE 1 TABLET BY MOUTH EVERY 8 HOURS AS NEEDED FOR MUSCLE SPASM, Disp: 30 tablet, Rfl:  0   mupirocin ointment (BACTROBAN) 2 %, Apply 1 Application topically 2 (two) times daily., Disp: , Rfl:    omeprazole (PRILOSEC) 40 MG capsule, Omeprazole 40 mg once daily for 6 weeks, Disp: 30 capsule, Rfl: 1   ondansetron (ZOFRAN-ODT) 8 MG disintegrating tablet, Take 1 tablet (8 mg total) by mouth every 8 (eight) hours as needed for nausea or vomiting., Disp: 60 tablet, Rfl: 1   promethazine (PHENERGAN) 25 MG tablet, Take 1 tablet (25 mg total) by mouth every 8 (eight) hours as needed for nausea or vomiting., Disp: 20 tablet, Rfl: 0   QUEtiapine (SEROQUEL) 300 MG tablet, TAKE 1 TABLET BY MOUTH AT BEDTIME, Disp: 30 tablet, Rfl: 0   Rimegepant Sulfate (NURTEC) 75 MG TBDP, Take 1 tablet by mouth daily as needed., Disp: 10 tablet, Rfl: 0   rOPINIRole (REQUIP) 1 MG tablet, Take 1 tablet by mouth twice daily, Disp: 180 tablet, Rfl: 3   rosuvastatin (CRESTOR) 5 MG tablet, Take 1 tablet (5 mg total) by mouth daily., Disp: 90 tablet, Rfl: 1   tirzepatide (MOUNJARO) 5 MG/0.5ML Pen, Inject 5 mg into the skin once a week., Disp: 6 mL, Rfl: 1   vedolizumab (ENTYVIO) 300 MG injection, Inject into the vein. Every 4 weeks, Disp: , Rfl:    sertraline (ZOLOFT) 100 MG tablet, Take 2.5 tablets (250 mg total) by mouth daily., Disp: 75 tablet, Rfl: 0 Medication Side Effects: none  Family Medical/ Social History: Changes?  no  MENTAL HEALTH EXAM:  There were no vitals taken for this visit.There is no height or weight on file to calculate BMI.  General Appearance: Casual, Neat, and Well Groomed  Eye Contact:  Good  Speech:  Clear and Coherent and Normal Rate  Volume:  Normal  Mood:  Anxious  Affect:  Congruent  Thought Process:  Goal Directed and Descriptions of Associations: Circumstantial  Orientation:  Full (Time, Place, and Person)  Thought Content: Logical   Suicidal Thoughts:  No  Homicidal Thoughts:  No  Memory:   stable   Judgement:  Good  Insight:  Good  Psychomotor Activity:  Normal   Concentration:  Concentration: Good and Attention Span: Good  Recall:  Good  Fund of Knowledge: Good  Language: Good  Assets:  Desire for Improvement Financial Resources/Insurance Housing Transportation Vocational/Educational  ADL's:  Intact  Cognition: WNL  Prognosis:  Good   See labs on chart from 09/06/2022 ER notes reviewed.  DIAGNOSES:    ICD-10-CM   1. Bipolar I disorder ([place])  [id number] Basic metabolic panel    2. Encounter for long-term (current) use of medications  123456 Basic metabolic panel    3. Generalized anxiety disorder  F41.1     4. Benzodiazepine dependence ([place])  F13.20     5. Restless legs syndrome (RLS)  G25.81     6. Imbalance  R26.89      Receiving Psychotherapy: No   RECOMMENDATIONS:  PDMP reviewed.  Last Xanax was filled 09/27/2022. I provided 30 minutes of face to face time during this encounter, including time spent before and after the visit in records review, medical decision making, counseling pertinent to today's visit, and charting.   Weaning off Xanax. Only send  1-2 wks at a time   She had falls and hallucinations on the BuSpar, however I am not convinced it was from the drug.  She had a UTI at the same time, that could have been an issue.  In her research she has read that gabapentin can cause falls and confusion so she is not willing to take that even at low doses.  I reminded her that all medications can cause some sort of side effect, but until the neuro workup is complete and balance improves, we will hold off on that.  She ask about Valium.  I will not substitute 1 benzodiazepine for another.  She was not aware that this is also a benzodiazepine.  I recommend increasing the Zoloft to 250 mg, this is above FDA recommended dose but according to Dr. Phillips Climes prescribers guide up to 3 or 400 mg is sometimes necessary.  I will check for hyponatremia, sometime within the next week.  She understands the risk of that with being on an SSRI to  begin with.   Continue Xanax 0.5 mg, 1.5 pills every morning and 1 p.o. nightly.  She will take that for another 3 weeks and then will decrease to 0.5 mg twice daily. Continue Wellbutrin XL 150 mg, 3 p.o. every morning. Continue Lamictal 200 mg, 1 p.o. twice daily. Continue Seroquel 300 mg, 1 p.o. nightly. Continue Requip 1 mg bid (PCP) Increase Zoloft to 100 mg 2.5 pills daily. Continue Vit D 50,000 iu weekly. Return in 4 weeks.   Donnal Moat, PA-C

## 2022-10-04 ENCOUNTER — Ambulatory Visit (INDEPENDENT_AMBULATORY_CARE_PROVIDER_SITE_OTHER): Payer: BC Managed Care – PPO

## 2022-10-04 VITALS — BP 115/75 | HR 75 | Temp 97.8°F | Resp 18 | Ht 60.0 in | Wt 120.8 lb

## 2022-10-04 DIAGNOSIS — K5100 Ulcerative (chronic) pancolitis without complications: Secondary | ICD-10-CM

## 2022-10-04 MED ORDER — VEDOLIZUMAB 300 MG IV SOLR
300.0000 mg | Freq: Once | INTRAVENOUS | Status: AC
  Administered 2022-10-04: 300 mg via INTRAVENOUS
  Filled 2022-10-04: qty 5

## 2022-10-04 NOTE — Progress Notes (Signed)
Diagnosis: Crohn's Disease  Provider:  Marshell Garfinkel MD  Procedure: Infusion  IV Type: Peripheral, IV Location: L Forearm  Entyvio (Vedolizumab), Dose: 200 mg  Infusion Start Time: [id number]  Infusion Stop Time: [id number]  Post Infusion IV Care: Peripheral IV Discontinued  Discharge: Condition: Good, Destination: Home . AVS Provided  Performed by:  Arnoldo Morale, RN

## 2022-10-05 ENCOUNTER — Other Ambulatory Visit: Payer: Self-pay | Admitting: Nurse Practitioner

## 2022-10-05 NOTE — Telephone Encounter (Signed)
Pt states she is out of the medication and needs a refill asap

## 2022-10-05 NOTE — Telephone Encounter (Signed)
Requested medication (s) are due for refill today: yes  Requested medication (s) are on the active medication list: yes  Last refill:  09/26/22 #30  Future visit scheduled: yes  Notes to clinic:  med not delegated to NT to RF   Requested Prescriptions  Pending Prescriptions Disp Refills   methocarbamol (ROBAXIN) 500 MG tablet [Pharmacy Med Name: Methocarbamol 500 MG Oral Tablet] 30 tablet 0    Sig: TAKE 1 TABLET BY MOUTH EVERY 8 HOURS AS NEEDED FOR MUSCLE SPASM     Not Delegated - Analgesics:  Muscle Relaxants Failed - 10/05/2022  9:04 AM      Failed - This refill cannot be delegated      Passed - Valid encounter within last 6 months    Recent Outpatient Visits           1 week ago Recurrent UTI   [place], NP   3 weeks ago Acute cystitis without hematuria   [place], NP   1 month ago Chronic heart failure with preserved ejection fraction [facility])   [place] [name], NP   1 month ago Memory change   [place], Karen, NP   1 month ago Cellulitis of right upper extremity   [place], PA-C       Future Appointments             In 5 days [name], NP [place], [place]   In 3 weeks [name], NP [place], [place]   In 3 weeks [place] [address], [place]

## 2022-10-08 ENCOUNTER — Telehealth: Payer: Self-pay | Admitting: Physician Assistant

## 2022-10-08 ENCOUNTER — Ambulatory Visit: Payer: Self-pay

## 2022-10-08 NOTE — Telephone Encounter (Signed)
  Chief Complaint: Shortness of breath, confusion, weakness, difficulty swallowing Symptoms: above Frequency: SOB  started today, other s/s all weekend Pertinent Negatives: Patient denies  Disposition: [x] ED /[] Urgent Care (no appt availability in office) / [] Appointment(In office/virtual)/ []  [facility]/ [] Home Care/ [] Refused Recommended Disposition /[] [place] [facility]/ []  Follow-up with PCP Additional Notes: Pt states that after her shower this morning she was very SOB. She had to lean forward to breathe which was not all that helpful. Pt states that she has been weak all weekend. She also states that she is having difficulty swallowing and feels confused. PT will not call EMS. She does not want to go to [facility]. She will call her husband to come home and take her to [place].   Reason for Disposition  [1] MODERATE difficulty breathing (e.g., speaks in phrases, SOB even at rest, pulse 100-120) AND [2] NEW-onset or WORSE than normal  Answer Assessment - Initial Assessment Questions 1. RESPIRATORY STATUS: "Describe your breathing?" (e.g., wheezing, shortness of breath, unable to speak, severe coughing)      Shortness of breath after shower 2. ONSET: "When did this breathing problem begin?"      Hasn't felt good all weekend - breathing difficulty 3. PATTERN "Does the difficult breathing come and go, or has it been constant since it started?"      Constant 4. SEVERITY: "How bad is your breathing?" (e.g., mild, moderate, severe)    - MILD: No SOB at rest, mild SOB with walking, speaks normally in sentences, can lie down, no retractions, pulse < 100.    - MODERATE: SOB at rest, SOB with minimal exertion and prefers to sit, cannot lie down flat, speaks in phrases, mild retractions, audible wheezing, pulse 100-120.    - SEVERE: Very SOB at rest, speaks in single words, struggling to breathe, sitting hunched forward, retractions, pulse > 120      moderate 5. RECURRENT SYMPTOM:  "Have you had difficulty breathing before?" If Yes, ask: "When was the last time?" and "What happened that time?"       6. CARDIAC HISTORY: "Do you have any history of heart disease?" (e.g., heart attack, angina, bypass surgery, angioplasty)       7. LUNG HISTORY: "Do you have any history of lung disease?"  (e.g., pulmonary embolus, asthma, emphysema)      8. CAUSE: "What do you think is causing the breathing problem?"      unsure 9. OTHER SYMPTOMS: "Do you have any other symptoms? (e.g., dizziness, runny nose, cough, chest pain, fever)     Trouble swallowing, Weakness 10. O2 SATURATION MONITOR:  "Do you use an oxygen saturation monitor (pulse oximeter) at home?" If Yes, ask: "What is your reading (oxygen level) today?" "What is your usual oxygen saturation reading?" (e.g., 95%)  Protocols used: Breathing Difficulty-A-AH

## 2022-10-08 NOTE — Telephone Encounter (Signed)
[facility] called and said that she needs a prior authorization for her Sertraline. She said the paperwork was sent for this. States that the pharmacy can't dispense 2-1/2 pills daily.

## 2022-10-08 NOTE — Telephone Encounter (Signed)
Agree that patient should be seen in the ER.

## 2022-10-10 ENCOUNTER — Telehealth: Payer: Self-pay | Admitting: Physician Assistant

## 2022-10-10 ENCOUNTER — Ambulatory Visit: Payer: BC Managed Care – PPO | Admitting: Nurse Practitioner

## 2022-10-10 ENCOUNTER — Telehealth: Payer: Self-pay

## 2022-10-10 ENCOUNTER — Encounter: Payer: Self-pay | Admitting: Nurse Practitioner

## 2022-10-10 VITALS — BP 103/69 | HR 102 | Temp 97.5°F | Wt 118.5 lb

## 2022-10-10 DIAGNOSIS — F13930 Sedative, hypnotic or anxiolytic use, unspecified with withdrawal, uncomplicated: Secondary | ICD-10-CM | POA: Diagnosis not present

## 2022-10-10 DIAGNOSIS — R7989 Other specified abnormal findings of blood chemistry: Secondary | ICD-10-CM

## 2022-10-10 MED ORDER — ROPINIROLE HCL 1 MG PO TABS: 1.0000 mg | ORAL_TABLET | Freq: Every day | ORAL | 1 refills | Status: DC

## 2022-10-10 NOTE — Telephone Encounter (Signed)
Patient reviewed and responded to MyChart message. Last read by [name] at 12:29 PM on 10/10/2022.

## 2022-10-10 NOTE — Telephone Encounter (Signed)
Talked with patient and her husband. Pt saw PCP today for a scheduled appt and she said she is going through Bz withdrawal and thinks the taper should have been done slower. Sx started Monday. Husband said she broke out in a cold sweat, was curled up in a fetal position, crying, and he had difficulty calming her. Patient tried to tell me sx also but she struggled to describe them and asked me to talk with husband.   Pharmacy = [facility], [place]

## 2022-10-10 NOTE — Telephone Encounter (Signed)
Please clarify the xanax dose she's taking. In her PCP's note, it says she's currently "on 0.25 mg TID, after having been on 1 mg TID for 15 years."  Per my note on 10/03/2022, she should be taking 0.5 mg, 1.5 pills (0.75 mg) every morning and 1 pill (0.5 mg) nightly.  If she is taking 0.25 mg 3 times daily then she is weaning too quickly by not following directions.  So start taking as directed on 10/03/2022.  If she is taking it correctly then continue the 0.75 mg dose in the morning but increase from 0.5 mg up to 0.75 mg at night. For the record I am not sure that she has been completely forthright about the amount of Xanax that she was taking.  On 08/16/2022 she admitted that she was addicted to Xanax, had been taking it 4 times daily some days, when it was only prescribed for 3 times daily, and then would run out before she could get it filled in 30 days. She took more Seroquel than prescribed in the past (04/24/2021) and hasn't followed other med instructions correctly, ie increasing zoloft.

## 2022-10-10 NOTE — Progress Notes (Signed)
BP 103/69   Pulse (!) 102   Temp (!) 97.5 F (36.4 C) (Oral)   Wt 118 lb 8 oz (53.8 kg)   LMP  (LMP Unknown)   SpO2 99%   BMI 23.14 kg/m    Subjective:    Patient ID: Amy Hansen, female    DOB: 24-Feb-1956, 67 y.o.   MRN: [id number]  HPI: Amy Hansen is a 67 y.o. female  Chief Complaint  Patient presents with   Pain    2 week f/up    Patient presents to clinic today for follow up.  She took a shower on Monday and got out of the shower and got short of breath.  Her breathing was very labored.  She went ahead and went to work.  She wasn't able to check her sugars.  She is weaning off the xanax.  She ate some breakfast and took a nap for about two hours.  Now she still feels weak.  States yesterday she had an episode where she was cold, clammy, and sweaty.  Her sugar was 70.  Her husband gave her orange juice and she isn't sure if that helped because at the same time she took two additional Xanax.  She feels like she may need to go into a facility if the wean is going to be that fast.   She is scheduled for an MRI of her brain on March 26.     Relevant past medical, surgical, family and social history reviewed and updated as indicated. Interim medical history since our last visit reviewed. Allergies and medications reviewed and updated.  Review of Systems  Constitutional:  Positive for chills, diaphoresis and fatigue.  Genitourinary:  Negative for dysuria, frequency and urgency.    Per HPI unless specifically indicated above     Objective:    BP 103/69   Pulse (!) 102   Temp (!) 97.5 F (36.4 C) (Oral)   Wt 118 lb 8 oz (53.8 kg)   LMP  (LMP Unknown)   SpO2 99%   BMI 23.14 kg/m   Wt Readings from Last 3 Encounters:  10/10/22 118 lb 8 oz (53.8 kg)  10/04/22 120 lb 12.8 oz (54.8 kg)  09/26/22 124 lb 12.8 oz (56.6 kg)    Physical Exam Vitals and nursing note reviewed.  Constitutional:      General: She is not in acute distress.    Appearance: Normal  appearance. She is normal weight. She is not ill-appearing, toxic-appearing or diaphoretic.  HENT:     Head: Normocephalic.     Right Ear: External ear normal.     Left Ear: External ear normal.     Nose: Nose normal.     Mouth/Throat:     Mouth: Mucous membranes are moist.     Pharynx: Oropharynx is clear.  Eyes:     General:        Right eye: No discharge.        Left eye: No discharge.     Extraocular Movements: Extraocular movements intact.     Conjunctiva/sclera: Conjunctivae normal.     Pupils: Pupils are equal, round, and reactive to light.  Cardiovascular:     Rate and Rhythm: Normal rate and regular rhythm.     Heart sounds: No murmur heard. Pulmonary:     Effort: Pulmonary effort is normal. No respiratory distress.     Breath sounds: Normal breath sounds. No wheezing or rales.  Musculoskeletal:     Cervical back:  Normal range of motion and neck supple.  Skin:    General: Skin is warm and dry.     Capillary Refill: Capillary refill takes less than 2 seconds.  Neurological:     General: No focal deficit present.     Mental Status: She is alert and oriented to person, place, and time. Mental status is at baseline.  Psychiatric:        Mood and Affect: Mood normal.        Behavior: Behavior normal.        Thought Content: Thought content normal.        Judgment: Judgment normal.     Results for orders placed or performed in visit on 09/26/22  Urine Culture   Specimen: Urine   UR  Result Value Ref Range   Urine Culture, Routine Final report (A)    Organism ID, Bacteria Lelliottia amnigena (A)    Antimicrobial Susceptibility Comment       Assessment & Plan:   Problem List Items Addressed This Visit       Other   Benzodiazepine withdrawal without complication ([place]) - Primary    Patient is weaning down Xanax with psychiatrist. Was on '1mg'$  TID for 15 years. Currently on 0.'25mg'$  TID and not tolerating it well. Having withdrawal symptoms. Discussed with patient to  ask psychiatrist to wean medication more slowly. She wants to continue with the wean but is struggling with the withdrawal. Follow up in 3 weeks.  Call sooner if concerns arise.         Follow up plan: Return in about 3 weeks (around 10/31/2022) for HTN, HLD, DM2 FU.

## 2022-10-10 NOTE — Telephone Encounter (Signed)
Lab orders in epic (GGT included - see RUQ [name] result note).   MyChart message sent to patient with lab reminder.

## 2022-10-10 NOTE — Assessment & Plan Note (Signed)
Patient is weaning down Xanax with psychiatrist. Was on '1mg'$  TID for 15 years. Currently on 0.'25mg'$  TID and not tolerating it well. Having withdrawal symptoms. Discussed with patient to ask psychiatrist to wean medication more slowly. She wants to continue with the wean but is struggling with the withdrawal. Follow up in 3 weeks.  Call sooner if concerns arise.

## 2022-10-10 NOTE — Telephone Encounter (Signed)
-----   Message from Yevette Edwards, RN sent at 08/27/2022  1:09 PM EST ----- Regarding: Labs Hepatic function panel - need to enter order

## 2022-10-10 NOTE — Telephone Encounter (Signed)
Pt called at 11a.  She was just leaving her doctor.  She said he wanted her to see Helene Kelp as soon as possible because it sounds like she might be having withdrawal side effects from Xanax.  She moved her appt up to [facility] first available, but wants someone to call her back before then to discuss.  Next appt 3/21

## 2022-10-11 NOTE — Telephone Encounter (Signed)
Patient is taking as prescribed. She kept saying her provider said you were taking her down too quickly and it was scary. Provider was going off the medication bottle. I told her that the provider was not getting the whole picture based on that bottle. Patient repeatedly tells me the same thing over and over about the above and also repeats her sx that she described yesterday.

## 2022-10-11 NOTE — Telephone Encounter (Signed)
Addendum to attached message from yesterday. Tersa called again and told me the same story that is in the below messages. Her husband called and said he wants [name] to know that she is not a threat to herself or anyone else. She is trying to taper down her Xanax and wants to know how to do it safely. Her number is [phone]. Her husband is listed on the DPR to speak to.

## 2022-10-11 NOTE — Telephone Encounter (Signed)
Noted  

## 2022-10-12 NOTE — Telephone Encounter (Signed)
Amy Hansen requesting that husband come with patient to her appt next week.

## 2022-10-15 NOTE — Telephone Encounter (Signed)
Noted  

## 2022-10-15 NOTE — Telephone Encounter (Addendum)
Spoke with patient's husband. He is planning to come to her appt on Thursday. I again reviewed the medication directions with him and he said patient was taking 1/2 tab at night. I told him that was not correct, that she should be taking 1 tablet of 0.5 mg, not 1/2 tablet. He did not describe any further withdrawal sx similar to last week. He did express concerns that she is being weaned too quickly.

## 2022-10-15 NOTE — Telephone Encounter (Signed)
LVM to RC 

## 2022-10-17 ENCOUNTER — Other Ambulatory Visit: Payer: Self-pay | Admitting: Nurse Practitioner

## 2022-10-18 ENCOUNTER — Other Ambulatory Visit (INDEPENDENT_AMBULATORY_CARE_PROVIDER_SITE_OTHER): Payer: BC Managed Care – PPO

## 2022-10-18 ENCOUNTER — Ambulatory Visit (INDEPENDENT_AMBULATORY_CARE_PROVIDER_SITE_OTHER): Payer: BC Managed Care – PPO | Admitting: Physician Assistant

## 2022-10-18 ENCOUNTER — Encounter: Payer: Self-pay | Admitting: Physician Assistant

## 2022-10-18 DIAGNOSIS — F319 Bipolar disorder, unspecified: Secondary | ICD-10-CM | POA: Diagnosis not present

## 2022-10-18 DIAGNOSIS — E559 Vitamin D deficiency, unspecified: Secondary | ICD-10-CM

## 2022-10-18 DIAGNOSIS — R2689 Other abnormalities of gait and mobility: Secondary | ICD-10-CM

## 2022-10-18 DIAGNOSIS — G4700 Insomnia, unspecified: Secondary | ICD-10-CM

## 2022-10-18 DIAGNOSIS — F1320 Sedative, hypnotic or anxiolytic dependence, uncomplicated: Secondary | ICD-10-CM

## 2022-10-18 DIAGNOSIS — R413 Other amnesia: Secondary | ICD-10-CM

## 2022-10-18 DIAGNOSIS — F411 Generalized anxiety disorder: Secondary | ICD-10-CM | POA: Diagnosis not present

## 2022-10-18 DIAGNOSIS — G2581 Restless legs syndrome: Secondary | ICD-10-CM

## 2022-10-18 DIAGNOSIS — R7989 Other specified abnormal findings of blood chemistry: Secondary | ICD-10-CM

## 2022-10-18 LAB — HEPATIC FUNCTION PANEL
ALT: 47 U/L — ABNORMAL HIGH (ref 0–35)
AST: 62 U/L — ABNORMAL HIGH (ref 0–37)
Albumin: 3.5 g/dL (ref 3.5–5.2)
Alkaline Phosphatase: 229 U/L — ABNORMAL HIGH (ref 39–117)
Bilirubin, Direct: 0.1 mg/dL (ref 0.0–0.3)
Total Bilirubin: 0.3 mg/dL (ref 0.2–1.2)
Total Protein: 6.8 g/dL (ref 6.0–8.3)

## 2022-10-18 LAB — GAMMA GT: GGT: 78 U/L — ABNORMAL HIGH (ref 7–51)

## 2022-10-18 MED ORDER — LAMOTRIGINE 200 MG PO TABS: 400.0000 mg | ORAL_TABLET | Freq: Two times a day (BID) | ORAL | 11 refills | Status: DC

## 2022-10-18 MED ORDER — QUETIAPINE FUMARATE 300 MG PO TABS: 300.0000 mg | ORAL_TABLET | Freq: Every day | ORAL | 5 refills | Status: DC

## 2022-10-18 MED ORDER — ALPRAZOLAM 0.5 MG PO TABS: 0.7500 mg | ORAL_TABLET | Freq: Two times a day (BID) | ORAL | 0 refills | Status: DC

## 2022-10-18 NOTE — Patient Instructions (Signed)
On the Xanax 0.5 mg take 1.5 pills twice a day until we see each other again. On the lamotrigine 200 mg take 1 twice a day from now on. All other medications are the same.

## 2022-10-18 NOTE — Progress Notes (Signed)
[facility]  Patient ID: Amy Hansen,  MRN: [id number]  PCP: Amy Billings, NP  Date of Evaluation: 10/18/2022  Time spent:40 minutes  Chief Complaint:  Chief Complaint   Anxiety; Follow-up    HISTORY/CURRENT STATUS: Anxiety f/u. Husband, Amy Hansen, is with her and gives part of history.   Amy Hansen is not doing well.  There have been several phone calls in the past few weeks concerning her anxiety and weaning off Xanax.    Brief history is as follows: 08/16/2022 Xanax was reported as 1 mg 3 times daily, but sometimes took 4 pills/day and then would run out early.  She reported forgetfulness.  Not new or worse.  Stated she is addicted to Xanax and wanted to get off of it although nervous about that.  At that time she reported misunderstanding that she was not supposed to be on Zoloft 200 mg and was only taking 100 mg.   Copied from plan at that visit: "We discussed the dependence on Xanax.  She is overtaking it.  She reports clouded thinking and has had a couple of falls and even though she did not suffer an injury, the Xanax could be contributing to them. Will wean off slowly. Also recommend starting Buspar. Benefits, risks, SE discussed and she would like to try it. Also increase Zoloft, should have been on 200 mg all along.  Changes made today are listed on the AVS.   Weaning off Xanax. Only send 1-2 wks at a time    Decreased Xanax 1 mg to 1 p.o. every morning, 1 p.o. q. afternoon and 1/2 pill in the evening. Continue Wellbutrin XL 150 mg, 3 p.o. daily. Start BuSpar 5 mg, 1 p.o. twice daily. Continue Lamictal 200 mg, 1 p.o. twice daily. Continue Seroquel 300 mg, 1 p.o. nightly. Continue Requip 1 mg bid (PCP) Increase Zoloft to 150 mg, 2 weeks, then 2 qd.  Continue Vit D 50,000 iu weekly."  08/22/2022 phone call, patient stated that she was having hallucinations and could not tolerate the BuSpar.  Also fell 2-3 times since our visit.  BuSpar was stopped.  08/30/2022  office visit she reported she was doing well weaning off the Xanax, not having panic attacks.  Did get overwhelmed, Xanax effective.  Had no withdrawals.  She did increase Zoloft as directed.   Plan at that visit: "She is doing well weaning off the Xanax.  Will continue to drop the dose every 2 weeks.   Weaning off Xanax. Only send 1-2 wks at a time    Decrease Xanax 1 mg to 1 p.o. twice daily for 2 weeks and then 1 p.o. every morning and 1/2 pill q. evening (or vice versa) until the next visit. Continue Wellbutrin XL 150 mg, 3 p.o. every morning. Continue Lamictal 200 mg, 1 p.o. twice daily. Continue Seroquel 300 mg, 1 p.o. nightly. Continue Requip 1 mg bid (PCP) Continue Zoloft  100 mg, 2 qd.  Continue Vit D 50,000 iu weekly."  09/10/2022 phone call reporting she was having "shakes" and stated the Xanax decrease may have been too fast, she was also nauseated and had vomiting.  There was confusion concerning her dose of Xanax.  Plan was: "If she's taking 1.5 now, have her go back to 2 mg daily (divided so 1 mg bid) for 2 weeks, then decrease to 1.5 mg daily. "  10/03/2022 office visit, reported a fall hitting her head on the floor.  No loss of consciousness, no fracture on x-ray, CT  of the head showed no acute abnormality and no cervical spine fracture.  Stated she had been falling more and was seeing neuro with plan of MRI soon.  She is afraid of Alzheimer's. "She is tolerating the slow wean of Xanax fairly well.  Had 1 panic attack since our last visit.  She was not taking the gabapentin often because she read it can cause confusion and falls. Plan at that visit are as follows: "She had falls and hallucinations on the BuSpar, however I am not convinced it was from the drug.  She had a UTI at the same time, that could have been an issue.  In her research she has read that gabapentin can cause falls and confusion so she is not willing to take that even at low doses.  I reminded her that all  medications can cause some sort of side effect, but until the neuro workup is complete and balance improves, we will hold off on that.  She ask about Valium.  I will not substitute 1 benzodiazepine for another.  She was not aware that this is also a benzodiazepine.  I recommend increasing the Zoloft to 250 mg, this is above FDA recommended dose but according to Amy Hansen prescribers guide up to 3 or 400 mg is sometimes necessary.  I will check for hyponatremia, sometime within the next week.  She understands the risk of that with being on an SSRI to begin with.   Continue Xanax 0.5 mg, 1.5 pills every morning and 1 p.o. nightly.  She will take that for another 3 weeks and then will decrease to 0.5 mg twice daily. Continue Wellbutrin XL 150 mg, 3 p.o. every morning. Continue Lamictal 200 mg, 1 p.o. twice daily. Continue Seroquel 300 mg, 1 p.o. nightly. Continue Requip 1 mg bid (PCP) Increase Zoloft to 100 mg 2.5 pills daily. Continue Vit D 50,000 iu weekly.  10/10/2022 telephone call from husband stating he felt that the wean was too fast.  She was having sweats, crying and increased anxiety.  Patient reported that she was taking the Xanax as prescribed.  She saw her PCP on 10/10/2022 stating she was taking 0.25 mg 3 times daily which was not accurate.  Several other phone calls in between that time and this visit when CMA Amy Hansen spoke with the patient's husband, gave him the correct dose and directions.  Since that time her husband has been giving her her medications, Amy Hansen tells me today that she was not told to increase the Xanax dose on 10/10/2022, I documentation shows that she was called about this.  Patient does not remember it at all and did not follow directions.  She has been confused, had another episode on 10/14/2022 of sweats, cried, was yelling for a few minutes and curled up in a fetal position.  At that time her husband gave her another Xanax and she started feeling better  within about 20 minutes.  Today patient reports that she called a drug rehab facility in [place] and also another 1 in [place].  But she feels that with her husband's help she does not need to be admitted.  She really wants to get off this med.  She again admitted to overtaking it in the past.  Since last week her husband has been dispensing the medication but not giving as directed at the last phone call, patient did not remember.  She is very afraid of having Alzheimer's and also afraid for some reason that I  will drop her as a patient.  In the past she increased Seroquel on her own and if my memory serves me correctly we did talk about compliance with medications and the fact that she could be dismissed if she continued to change medications on her own without discussing with me.  States she is not depressed but scared about weaning off Xanax because she has been on it for so long.  Personal hygiene and appetite are both normal.  She is able to work and ADLs are normal.  She is sleeping okay.  No suicidal or homicidal thoughts.  Patient denies increased energy with decreased need for sleep, increased talkativeness, racing thoughts, impulsivity or risky behaviors, increased spending, increased libido, grandiosity, increased irritability or anger, paranoia, or hallucinations.  Review of Systems  Constitutional: Negative.   HENT: Negative.    Eyes: Negative.   Respiratory: Negative.    Cardiovascular: Negative.   Gastrointestinal: Negative.   Genitourinary: Negative.   Musculoskeletal: Negative.   Skin: Negative.   Neurological: Negative.   Endo/Heme/Allergies: Negative.   Psychiatric/Behavioral:         See HPI    Individual Medical History/ Review of Systems: Changes? :Yes    see HPI  Past medications for mental health diagnoses include: Trazodone, Risperdal, Zoloft, Lunesta, prazosin, Sonata, Prozac, Depakote, Lamictal, lithium, Wellbutrin, Xanax, Ambien, carbamazepine, Seroquel,  Buspar caused falls and hallucinations, Gabapentin-she doesn't want to take  Allergies: Avelox [moxifloxacin hcl in nacl], Bactrim [sulfamethoxazole-trimethoprim], Ciprofloxacin, Buspar [buspirone], Neomycin-polymyxin-dexameth, Aquaphor [lanolin-petrolatum], Depakote [divalproex sodium], Imitrex [sumatriptan], and Stadol [butorphanol]  Current Medications:  Current Outpatient Medications:    aspirin EC 81 MG tablet, Take 1 tablet (81 mg) by mouth once daily. Swallow whole., Disp: , Rfl:    buPROPion (WELLBUTRIN XL) 150 MG 24 hr tablet, Take 3 tablets (450 mg total) by mouth daily., Disp: 270 tablet, Rfl: 3   conjugated estrogens (PREMARIN) vaginal cream, Apply one pea-sized amount around the opening of the urethra daily for 2 weeks, then 3 times weekly moving forward., Disp: 30 g, Rfl: 4   erythromycin ophthalmic ointment, Place 1 Application into the left eye 2 (two) times daily., Disp: , Rfl:    furosemide (LASIX) 20 MG tablet, Take 1 tablet (20 mg total) by mouth daily. May take an additional tablet (20mg ) AS NEEDED for weight gain and/or swelling., Disp: 90 tablet, Rfl: 3   methenamine (HIPREX) 1 g tablet, Take 1 tablet (1 g total) by mouth 2 (two) times daily with a meal., Disp: 30 tablet, Rfl: 11   methocarbamol (ROBAXIN) 500 MG tablet, TAKE 1 TABLET BY MOUTH EVERY 8 HOURS AS NEEDED FOR MUSCLE SPASM, Disp: 30 tablet, Rfl: 0   Multiple Vitamin (MULTIVITAMIN) capsule, Take 1 capsule by mouth daily., Disp: , Rfl:    ondansetron (ZOFRAN-ODT) 8 MG disintegrating tablet, Take 1 tablet (8 mg total) by mouth every 8 (eight) hours as needed for nausea or vomiting., Disp: 60 tablet, Rfl: 1   promethazine (PHENERGAN) 25 MG tablet, Take 1 tablet (25 mg total) by mouth every 8 (eight) hours as needed for nausea or vomiting., Disp: 20 tablet, Rfl: 0   rOPINIRole (REQUIP) 1 MG tablet, Take 1 tablet (1 mg total) by mouth at bedtime., Disp: 90 tablet, Rfl: 1   rosuvastatin (CRESTOR) 5 MG tablet, Take 1 tablet  (5 mg total) by mouth daily., Disp: 90 tablet, Rfl: 1   sertraline (ZOLOFT) 100 MG tablet, Take 2.5 tablets (250 mg total) by mouth daily. (Patient taking differently: Take 100  mg by mouth daily.), Disp: 75 tablet, Rfl: 0   tirzepatide (MOUNJARO) 5 MG/0.5ML Pen, Inject 5 mg into the skin once a week., Disp: 6 mL, Rfl: 1   vedolizumab (ENTYVIO) 300 MG injection, Inject into the vein. Every 4 weeks, Disp: , Rfl:    ALPRAZolam (XANAX) 0.5 MG tablet, Take 1.5 tablets (0.75 mg total) by mouth 2 (two) times daily., Disp: 90 tablet, Rfl: 0   lamoTRIgine (LAMICTAL) 200 MG tablet, Take 2 tablets (400 mg total) by mouth 2 (two) times daily., Disp: 60 tablet, Rfl: 11   QUEtiapine (SEROQUEL) 300 MG tablet, Take 1 tablet (300 mg total) by mouth at bedtime., Disp: 30 tablet, Rfl: 5 Medication Side Effects: none  Family Medical/ Social History: Changes?  no  MENTAL HEALTH EXAM:  There were no vitals taken for this visit.There is no height or weight on file to calculate BMI.  General Appearance: Casual, Neat, and Well Groomed  Eye Contact:  Good  Speech:  Clear and Coherent and Normal Rate  Volume:  Normal  Mood:  Anxious  Affect:  Anxious  Thought Process:  Goal Directed and Descriptions of Associations: Circumstantial  Orientation:  Full (Time, Place, and Person)  Thought Content: Logical   Suicidal Thoughts:  No  Homicidal Thoughts:  No  Memory:   stable   Judgement:  Good  Insight:  Good  Psychomotor Activity:  Normal  Concentration:  Concentration: Good and Attention Span: Good  Recall:  Good  Fund of Knowledge: Good  Language: Good  Assets:  Desire for Improvement Financial Resources/Insurance Housing Transportation Vocational/Educational  ADL's:  Intact  Cognition: WNL  Prognosis:  Good   DIAGNOSES:    ICD-10-CM   1. 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 Bipolar I disorder ([place])  F31.9     3. Generalized anxiety disorder  F41.1     4. Restless legs syndrome (RLS)   G25.81     5. Imbalance  R26.89     6. Insomnia, unspecified type  G47.00     7. Vitamin D deficiency  E55.9     8. Memory changes  R41.3      Receiving Psychotherapy: No   RECOMMENDATIONS:  PDMP reviewed.  Last Xanax was filled 09/27/2022. I provided 40 minutes of face to face time during this encounter, including time spent before and after the visit in records review, medical decision making, counseling pertinent to today's visit, and charting.   We had a long discussion concerning benzodiazepine withdrawal, her inability to follow directions because of confusion and decreased memory.  Her husband will dispense the Xanax from now on and she will not be able to overtake it.  We will wean much slower than every 2 weeks, discussed the fact that she needs to be honest with me with how she is taking all of her medications and whether she is having withdrawals or side effects from the drugs or not.  Also discussed the fact that the dose of Zoloft she is on is slightly higher than FDA recommended dose, however it is common practice to prescribe this dose if needed.  The sweats she was having could possibly be caused by the Zoloft and I will watch that closely.  She is very afraid that she may have dementia caused by the Xanax.  Several years ago that was thought to be a possibility, however newer studies report that benzodiazepines do not cause an increased risk of dementia.  I reassured her that I  am not going to drop her as a patient.  She simply needs to follow directions and I will do my best to handle things at office visits, not over the phone unless we are speaking with her husband who does not have a problem with memory or confusion.  Changes made today were written on the AVS.  Will increase Xanax to what I had recommended several weeks ago and stay at that until her next visit.  Her husband needs to be with her at office visits, he understands and states he was planning to from now on  any way.  Increase Xanax 0.5 mg,to 1.5 pills twice daily. Continue Wellbutrin XL 150 mg, 3 p.o. every morning. Continue Lamictal 200 mg, 1 p.o. twice daily.  (Change from 2 pills nightly.) Continue Seroquel 300 mg, 1 p.o. nightly. Continue Requip 1 mg bid (PCP) Continue Zoloft 100 mg 2.5 pills daily. Continue Vit D 50,000 iu weekly. Return in 3 weeks.   Donnal Moat, PA-C

## 2022-10-18 NOTE — Telephone Encounter (Signed)
Requested medication (s) are due for refill today - provider review   Requested medication (s) are on the active medication list -yes  Future visit scheduled -yes  Last refill: 10/08/22 #30  Notes to clinic: non delegated Rx  Requested Prescriptions  Pending Prescriptions Disp Refills   methocarbamol (ROBAXIN) 500 MG tablet [Pharmacy Med Name: Methocarbamol 500 MG Oral Tablet] 30 tablet 0    Sig: TAKE 1 TABLET BY MOUTH EVERY 8 HOURS AS NEEDED FOR MUSCLE SPASM     Not Delegated - Analgesics:  Muscle Relaxants Failed - 10/17/2022 12:43 PM      Failed - This refill cannot be delegated      Passed - Valid encounter within last 6 months    Recent Outpatient Visits           1 week ago Benzodiazepine withdrawal without complication [facility])   [place] [name], NP   3 weeks ago Recurrent UTI   [place] [name], NP   1 month ago Acute cystitis without hematuria   [place] [name], NP   1 month ago Chronic heart failure with preserved ejection fraction [facility])   [place] [name], NP   1 month ago Memory change   [place] [name], NP       Future Appointments             In 1 week [name], NP [place], [place]   In 1 week [address], [place] [facility]               Requested Prescriptions  Pending Prescriptions Disp Refills   methocarbamol (ROBAXIN) 500 MG tablet [Pharmacy Med Name: Methocarbamol 500 MG Oral Tablet] 30 tablet 0    Sig: TAKE 1 TABLET BY MOUTH EVERY 8 HOURS AS NEEDED FOR MUSCLE SPASM     Not Delegated - Analgesics:  Muscle Relaxants Failed - 10/17/2022 12:43 PM      Failed - This refill cannot be delegated      Passed - Valid encounter within last 6 months    Recent Outpatient Visits           1 week ago  Benzodiazepine withdrawal without complication [facility])   [place], NP   3 weeks ago Recurrent UTI   [place], NP   1 month ago Acute cystitis without hematuria   [place], NP   1 month ago Chronic heart failure with preserved ejection fraction [facility])   [place] [name], NP   1 month ago Memory change   [place] [name], NP       Future Appointments             In 1 week [name], [place], [place]   In 1 week [address], [place]

## 2022-10-20 ENCOUNTER — Other Ambulatory Visit: Payer: Self-pay | Admitting: Nurse Practitioner

## 2022-10-20 DIAGNOSIS — E119 Type 2 diabetes mellitus without complications: Secondary | ICD-10-CM

## 2022-10-22 MED ORDER — SERTRALINE HCL 100 MG PO TABS: 250.0000 mg | ORAL_TABLET | Freq: Every day | ORAL | 0 refills | Status: DC

## 2022-10-22 NOTE — Telephone Encounter (Signed)
Unable to refill per protocol, Rx expired. Discontinued 08/24/22, competed course.  Requested Prescriptions  Pending Prescriptions Disp Refills   metFORMIN (GLUCOPHAGE) 1000 MG tablet [Pharmacy Med Name: metFORMIN HCl 1000 MG Oral Tablet] 180 tablet 0    Sig: Take 1 tablet by mouth twice daily     Endocrinology:  Diabetes - Biguanides Passed - 10/20/2022  6:40 AM      Passed - Cr in normal range and within 360 days    Creatinine  Date Value Ref Range Status  01/10/2012 0.58 (L) 0.60 - 1.30 mg/dL Final   Creatinine, Ser  Date Value Ref Range Status  08/24/2022 0.84 0.57 - 1.00 mg/dL Final         Passed - HBA1C is between 0 and 7.9 and within 180 days    Hemoglobin A1C  Date Value Ref Range Status  03/21/2016 7.3%  Final   HB A1C (BAYER DCA - WAIVED)  Date Value Ref Range Status  10/27/2020 8.7 (H) <7.0 % Final    Comment:                                          Diabetic Adult            <7.0                                       Healthy Adult        4.3 - 5.7                                                           (DCCT/NGSP) American Diabetes Association's Summary of Glycemic Recommendations for Adults with Diabetes: Hemoglobin A1c <7.0%. More stringent glycemic goals (A1c <6.0%) may further reduce complications at the cost of increased risk of hypoglycemia.    Hgb A1c MFr Bld  Date Value Ref Range Status  08/02/2022 6.1 (H) 4.8 - 5.6 % Final    Comment:             Prediabetes: 5.7 - 6.4          Diabetes: >6.4          Glycemic control for adults with diabetes: <7.0          Passed - eGFR in normal range and within 360 days    EGFR (African American)  Date Value Ref Range Status  01/10/2012 >60  Final   GFR calc Af Amer  Date Value Ref Range Status  05/26/2020 112 >59 mL/min/1.73 Final    Comment:    **In accordance with recommendations from the NKF-ASN Task force,**   [facility] is in the process of updating its eGFR calculation to the   2021 CKD-EPI  creatinine equation that estimates kidney function   without a race variable.    EGFR (Non-African Amer.)  Date Value Ref Range Status  01/10/2012 >60  Final    Comment:    eGFR values <72mL/min/1.73 m2 may be an indication of chronic kidney disease (CKD). Calculated eGFR is useful in patients with stable renal function. The eGFR calculation will not be reliable in acutely ill  patients when serum creatinine is changing rapidly. It is not useful in  patients on dialysis. The eGFR calculation may not be applicable to patients at the low and high extremes of body sizes, pregnant women, and vegetarians.    GFR, Estimated  Date Value Ref Range Status  08/19/2022 >60 >60 mL/min Final    Comment:    (NOTE) Calculated using the CKD-EPI Creatinine Equation (2021)    GFR  Date Value Ref Range Status  03/30/2021 88.73 >60.00 mL/min Final    Comment:    Calculated using the CKD-EPI Creatinine Equation (2021)   eGFR  Date Value Ref Range Status  08/24/2022 77 >59 mL/min/1.73 Final         Passed - B12 Level in normal range and within 720 days    Vitamin B-12  Date Value Ref Range Status  08/02/2022 520 232 - 1,245 pg/mL Final         Passed - Valid encounter within last 6 months    Recent Outpatient Visits           1 week ago Benzodiazepine withdrawal without complication ([place])   [place] [name], NP   3 weeks ago Recurrent UTI   [place], NP   1 month ago Acute cystitis without hematuria   [place] [name], NP   1 month ago Chronic heart failure with preserved ejection fraction [facility])   [place] [name], NP   1 month ago Memory change   [place] [name], NP       Future Appointments             In 1 week [name], NP [place]    In 1 week Debroah Loop, PA-C [facility] [place]            Passed - CBC within normal limits and completed in the last 12 months    WBC  Date Value Ref Range Status  08/24/2022 5.1 4.0 - 10.5 K/uL Final  08/24/2022 4.2 3.4 - 10.8 x10E3/uL Final   RBC  Date Value Ref Range Status  08/24/2022 4.50 3.87 - 5.11 Mil/uL Final  08/24/2022 4.69 3.77 - 5.28 x10E6/uL Final   Hemoglobin  Date Value Ref Range Status  08/24/2022 12.7 12.0 - 15.0 g/dL Final  08/24/2022 12.9 11.1 - 15.9 g/dL Final   HCT  Date Value Ref Range Status  08/24/2022 38.6 36.0 - 46.0 % Final   Hematocrit  Date Value Ref Range Status  08/24/2022 40.4 34.0 - 46.6 % Final   MCHC  Date Value Ref Range Status  08/24/2022 32.8 30.0 - 36.0 g/dL Final  08/24/2022 31.9 31.5 - 35.7 g/dL Final   [facility]  Date Value Ref Range Status  08/24/2022 27.5 26.6 - 33.0 pg Final  08/19/2022 27.6 26.0 - 34.0 pg Final   MCV  Date Value Ref Range Status  08/24/2022 85.7 78.0 - 100.0 fl Final  08/24/2022 86 79 - 97 fL Final  01/10/2012 89 80 - 100 fL Final   No results found for: "PLTCOUNTKUC", "LABPLAT", "POCPLA" RDW  Date Value Ref Range Status  08/24/2022 20.1 (H) 11.5 - 15.5 % Final  08/24/2022 17.4 (H) 11.7 - 15.4 % Final  01/10/2012 12.9 11.5 - 14.5 % Final

## 2022-10-23 ENCOUNTER — Ambulatory Visit
Admission: RE | Admit: 2022-10-23 | Discharge: 2022-10-23 | Disposition: A | Discharge status: Home / Self Care | Payer: BC Managed Care – PPO | Source: Ambulatory Visit | Attending: Nurse Practitioner | Admitting: Nurse Practitioner

## 2022-10-23 DIAGNOSIS — R410 Disorientation, unspecified: Secondary | ICD-10-CM | POA: Insufficient documentation

## 2022-10-23 MED ORDER — GADOBUTROL 1 MMOL/ML IV SOLN
5.0000 mL | Freq: Once | INTRAVENOUS | Status: AC | PRN
  Administered 2022-10-23: 5 mL via INTRAVENOUS

## 2022-10-23 NOTE — Telephone Encounter (Signed)
Patient would like to proceed with MRCP prior to her office visit with Dr. Havery Moros in April.  She is advised I will place the orders and she will be contacted directly with an appointment date and time.  She is also provided the number to central scheduling at [phone] to schedule directly if she wishes.

## 2022-10-23 NOTE — Addendum Note (Signed)
Addended by: Marlon Pel on: 10/23/2022 03:58 PM   Modules accepted: Orders

## 2022-10-23 NOTE — Telephone Encounter (Signed)
Inbound call from patient requesting to speak with a provider.

## 2022-10-24 ENCOUNTER — Encounter: Payer: Self-pay | Admitting: Nurse Practitioner

## 2022-10-24 NOTE — Progress Notes (Signed)
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  She did have a small newer stroke.  It does also appear that she has a small amount of dementia but that is stable since her MRI in 2022.  I recommend she follow up with her Neurologist.

## 2022-10-25 ENCOUNTER — Ambulatory Visit: Payer: BC Managed Care – PPO | Admitting: Physician Assistant

## 2022-10-25 ENCOUNTER — Encounter: Payer: Self-pay | Admitting: Physician Assistant

## 2022-10-25 VITALS — BP 115/79 | HR 118 | Ht 60.0 in | Wt 121.0 lb

## 2022-10-25 DIAGNOSIS — R310 Gross hematuria: Secondary | ICD-10-CM | POA: Diagnosis not present

## 2022-10-25 LAB — BLADDER SCAN AMB NON-IMAGING

## 2022-10-25 NOTE — Progress Notes (Deleted)
10/25/2022 2:51 PM   [facility] 1955-09-18 [id number]  CC: Chief Complaint  Patient presents with   Hematuria    HPI: [facility] is a 67 y.o. female with PMH *** who presents today for ***.   Today she reports ***  In-office UA today positive for ***; urine microscopy with *** WBCs/HPF, *** RBCs/HPF, and ***. PVR ***mL.  PMH: Past Medical History:  Diagnosis Date   Anemia    Anxiety    Arthritis    Blood transfusion without reported diagnosis    Cataract    CHF (congestive heart failure) (HCC)    Chronic kidney disease    UTI, hematuria in urine   Colitis    Crohn's disease ([place])    Depression    Diabetes ([place])    Diverticulosis    Frequent headaches    Interstitial cystitis    Recurrent UTI    Restless leg syndrome    TIA (transient ischemic attack)    more than year ago per pt 12-26-21   TIA (transient ischemic attack) 02/20/2021   Urinary frequency     Surgical History: Past Surgical History:  Procedure Laterality Date   bariatric bypass  2012   BIOPSY  05/03/2020   Procedure: BIOPSY;  Surgeon: Milus Banister, MD;  Location: [facility];  Service: Endoscopy;;   CARPAL TUNNEL RELEASE Right 2003   CARPAL TUNNEL RELEASE Right    2008   CHOLECYSTECTOMY  1975   COLONOSCOPY     COLONOSCOPY WITH PROPOFOL N/A 05/03/2020   Procedure: COLONOSCOPY WITH PROPOFOL;  Surgeon: Milus Banister, MD;  Location: [facility];  Service: Endoscopy;  Laterality: N/A;   CYSTOSCOPY W/ RETROGRADES Bilateral 06/06/2015   Procedure: CYSTOSCOPY WITH RETROGRADE PYELOGRAM;  Surgeon: Festus Aloe, MD;  Location: [facility];  Service: Urology;  Laterality: Bilateral;   EYE SURGERY Left 07/06/2022   FL INJ LEFT KNEE CT ARTHROGRAM ([facility] HX) Left    1995   GASTRIC BYPASS  2010   HAND SURGERY Left 01/19/2021   Thumb   HEMORRHOID SURGERY  2013   KNEE ARTHROSCOPY Left 1996   TONSILLECTOMY     TOTAL ABDOMINAL HYSTERECTOMY W/ BILATERAL SALPINGOOPHORECTOMY      Home  Medications:  Allergies as of 10/25/2022       Reactions   Avelox [moxifloxacin Hcl In Nacl] Anaphylaxis   Bactrim [sulfamethoxazole-trimethoprim] Anaphylaxis   Ciprofloxacin Other (See Comments)   Pt states she was told never to take this as it is in the same family as Avelox.    Buspar [buspirone] Other (See Comments)   hallucinations   Neomycin-polymyxin-dexameth Other (See Comments)   Burning in the eyes   Aquaphor [lanolin-petrolatum] Itching, Rash   Depakote [divalproex Sodium] Other (See Comments)   Unknown Reaction   Imitrex [sumatriptan] Other (See Comments)   Neck and shoulder pain   Stadol [butorphanol] Rash        Medication List        Accurate as of October 25, 2022  2:51 PM. If you have any questions, ask your nurse or doctor.          ALPRAZolam 0.5 MG tablet Commonly known as: Xanax Take 1.5 tablets (0.75 mg total) by mouth 2 (two) times daily.   aspirin EC 81 MG tablet Take 1 tablet (81 mg) by mouth once daily. Swallow whole.   buPROPion 150 MG 24 hr tablet Commonly known as: WELLBUTRIN XL Take 3 tablets (450 mg total) by mouth daily.  erythromycin ophthalmic ointment Place 1 Application into the left eye 2 (two) times daily.   furosemide 20 MG tablet Commonly known as: LASIX Take 1 tablet (20 mg total) by mouth daily. May take an additional tablet (20mg ) AS NEEDED for weight gain and/or swelling.   lamoTRIgine 200 MG tablet Commonly known as: LAMICTAL Take 2 tablets (400 mg total) by mouth 2 (two) times daily.   methenamine 1 g tablet Commonly known as: Hiprex Take 1 tablet (1 g total) by mouth 2 (two) times daily with a meal.   methocarbamol 500 MG tablet Commonly known as: ROBAXIN TAKE 1 TABLET BY MOUTH EVERY 8 HOURS AS NEEDED FOR MUSCLE SPASM   multivitamin capsule Take 1 capsule by mouth daily.   ondansetron 8 MG disintegrating tablet Commonly known as: ZOFRAN-ODT Take 1 tablet (8 mg total) by mouth every 8 (eight) hours as  needed for nausea or vomiting.   Premarin vaginal cream Generic drug: conjugated estrogens Apply one pea-sized amount around the opening of the urethra daily for 2 weeks, then 3 times weekly moving forward.   promethazine 25 MG tablet Commonly known as: PHENERGAN Take 1 tablet (25 mg total) by mouth every 8 (eight) hours as needed for nausea or vomiting.   QUEtiapine 300 MG tablet Commonly known as: SEROQUEL Take 1 tablet (300 mg total) by mouth at bedtime.   rOPINIRole 1 MG tablet Commonly known as: REQUIP Take 1 tablet (1 mg total) by mouth at bedtime.   rosuvastatin 5 MG tablet Commonly known as: Crestor Take 1 tablet (5 mg total) by mouth daily.   sertraline 100 MG tablet Commonly known as: ZOLOFT Take 2.5 tablets (250 mg total) by mouth daily.   tirzepatide 5 MG/0.5ML Pen Commonly known as: MOUNJARO Inject 5 mg into the skin once a week.   vedolizumab 300 MG injection Commonly known as: ENTYVIO Inject into the vein. Every 4 weeks        Allergies:  Allergies  Allergen Reactions   Avelox [Moxifloxacin Hcl In Nacl] Anaphylaxis   Bactrim [Sulfamethoxazole-Trimethoprim] Anaphylaxis   Ciprofloxacin Other (See Comments)    Pt states she was told never to take this as it is in the same family as Avelox.    Buspar [Buspirone] Other (See Comments)    hallucinations   Neomycin-Polymyxin-Dexameth Other (See Comments)    Burning in the eyes   Aquaphor [Lanolin-Petrolatum] Itching and Rash   Depakote [Divalproex Sodium] Other (See Comments)    Unknown Reaction   Imitrex [Sumatriptan] Other (See Comments)    Neck and shoulder pain   Stadol [Butorphanol] Rash    Family History: Family History  Problem Relation Age of Onset   Colon cancer Mother    Stroke Father    Heart failure Sister    Bladder Cancer Neg Hx    Kidney disease Neg Hx    Prostate cancer Neg Hx    Kidney cancer Neg Hx    Pancreatic cancer Neg Hx    Esophageal cancer Neg Hx    Stomach cancer  Neg Hx    Rectal cancer Neg Hx    Breast cancer Neg Hx     Social History:   reports that she quit smoking about 47 years ago. Her smoking use included cigarettes. She smoked an average of .5 packs per day. She has never used smokeless tobacco. She reports that she does not drink alcohol and does not use drugs.  Physical Exam: BP 115/79   Pulse (!) 118   Ht  5' (1.524 m)   Wt 121 lb (54.9 kg)   LMP  (LMP Unknown)   BMI 23.63 kg/m   Constitutional:  Alert and oriented, no acute distress, nontoxic appearing HEENT: [place], AT Cardiovascular: No clubbing, cyanosis, or edema Respiratory: Normal respiratory effort, no increased work of breathing GI: Abdomen is soft, nontender, nondistended, no abdominal masses GU: No CVA tenderness Lymph: No cervical or inguinal lymphadenopathy Skin: No rashes, bruises or suspicious lesions Neurologic: Grossly intact, no focal deficits, moving all 4 extremities Psychiatric: Normal mood and affect  Laboratory Data: Lab Results  Component Value Date   WBC 5.1 08/24/2022   HGB 12.7 08/24/2022   HCT 38.6 08/24/2022   MCV 85.7 08/24/2022   PLT 313.0 08/24/2022    Lab Results  Component Value Date   CREATININE 0.84 08/24/2022    CrCl cannot be calculated (Patient's most recent lab result is older than the maximum 21 days allowed.).  Results for orders placed or performed in visit on 10/18/22  Gamma GT  Result Value Ref Range   GGT 78 (H) 7 - 51 U/L  Hepatic function panel  Result Value Ref Range   Total Bilirubin 0.3 0.2 - 1.2 mg/dL   Bilirubin, Direct 0.1 0.0 - 0.3 mg/dL   Alkaline Phosphatase 229 (H) 39 - 117 U/L   AST 62 (H) 0 - 37 U/L   ALT 47 (H) 0 - 35 U/L   Total Protein 6.8 6.0 - 8.3 g/dL   Albumin 3.5 3.5 - 5.2 g/dL    ***  Pertinent Imaging: *** No results found for this or any previous visit.  No results found for this or any previous visit.  No results found for this or any previous visit.  No results found for this or  any previous visit.  No results found for this or any previous visit.  No valid procedures specified. No results found for this or any previous visit.  Results for orders placed during the hospital encounter of 05/22/15  CT RENAL STONE STUDY  Narrative CLINICAL DATA:  Recurrent UTIs, left flank pain/tenderness  EXAM: CT ABDOMEN AND PELVIS WITHOUT CONTRAST  TECHNIQUE: Multidetector CT imaging of the abdomen and pelvis was performed following the standard protocol without IV contrast.  COMPARISON:  12/23/2008  FINDINGS: Lower chest:  Lung bases are clear.  Hepatobiliary: Moderate to severe hepatic steatosis. Suspected focal fat along the lateral segment left hepatic lobe (series 2/image 15).  Status post cholecystectomy. No intrahepatic or extrahepatic ductal dilatation.  Pancreas: Within normal limits.  Spleen: Within normal limits.  Adrenals/Urinary Tract: Adrenal glands are within normal limits.  Kidneys are within normal limits.  No renal, ureteral, or bladder calculi.  No hydronephrosis.  Bladder is unremarkable.  Stomach/Bowel: Stomach is notable for a small hiatal hernia and postsurgical changes related to gastric bypass.  No evidence of bowel obstruction.  Normal appendix (series 2/image 51).  Colonic diverticulosis, without evidence of diverticulitis.  Moderate colonic stool burden.  Vascular/Lymphatic: No evidence of abdominal aortic aneurysm.  No suspicious abdominopelvic lymphadenopathy.  Reproductive: Uterus is unremarkable.  No adnexal masses.  Other: No abdominopelvic ascites.  Musculoskeletal: Mild degenerative changes of the visualized thoracolumbar spine.  IMPRESSION: No renal, ureteral, or bladder calculi.  No hydronephrosis.  No evidence of bowel obstruction.  Normal appendix.  Colonic diverticulosis, without evidence of diverticulitis.  Moderate colonic stool burden, suggesting constipation.   Electronically Signed By:  Julian Hy M.D. On: 05/22/2015 14:48   I personally reviewed the images referenced  above and ***.  Assessment & Plan:   1. Gross hematuria *** - Urinalysis, Complete - CULTURE, URINE COMPREHENSIVE   No follow-ups on file.  Debroah Loop, PA-C  [facility] [place] [address] [place], [place] [address] [phone]

## 2022-10-25 NOTE — Progress Notes (Signed)
10/25/2022 2:59 PM   Amy Hansen 30-Dec-1955 [id number]  CC: Chief Complaint  Patient presents with   Hematuria   HPI: Amy Hansen is a 67 y.o. female with PMH recurrent Klebsiella UTI versus chronic bacterial colonization previously on suppressive Macrobid but currently on Hiprex, IC, and hematuria who was recently lost to follow-up for 2 years who presents today for evaluation of gross hematuria.   Today she reports a several day history of gross hematuria without dysuria.  She admits this is the worst hematuria she has ever had.  She is also having some sensations of incomplete bladder emptying.  Notably, she has had a recent history of frequent falls and difficulty swallowing her pills.  She admits that is missing doses of Hiprex due to this.  She feels like "everything is falling apart."  She has had a number of recent scans including MRI brain and is scheduled for MRCP next week.  No association of gross hematuria onset with a follow-up.  In-office UA today positive for trace glucose, 3+ blood, 2+ protein, nitrites, and trace leukocytes; urine microscopy with 6-10 WBCs/HPF, >30 RBCs/HPF, and many bacteria. PVR 60mL.  PMH: Past Medical History:  Diagnosis Date   Anemia    Anxiety    Arthritis    Blood transfusion without reported diagnosis    Cataract    CHF (congestive heart failure) (HCC)    Chronic kidney disease    UTI, hematuria in urine   Colitis    Crohn's disease ([place])    Depression    Diabetes ([place])    Diverticulosis    Frequent headaches    Interstitial cystitis    Recurrent UTI    Restless leg syndrome    TIA (transient ischemic attack)    more than year ago per pt 12-26-21   TIA (transient ischemic attack) 02/20/2021   Urinary frequency     Surgical History: Past Surgical History:  Procedure Laterality Date   bariatric bypass  2012   BIOPSY  05/03/2020   Procedure: BIOPSY;  Surgeon: Milus Banister, MD;  Location: [place];  Service:  Endoscopy;;   CARPAL TUNNEL RELEASE Right 2003   CARPAL TUNNEL RELEASE Right    2008   CHOLECYSTECTOMY  1975   COLONOSCOPY     COLONOSCOPY WITH PROPOFOL N/A 05/03/2020   Procedure: COLONOSCOPY WITH PROPOFOL;  Surgeon: Milus Banister, MD;  Location: [place];  Service: Endoscopy;  Laterality: N/A;   CYSTOSCOPY W/ RETROGRADES Bilateral 06/06/2015   Procedure: CYSTOSCOPY WITH RETROGRADE PYELOGRAM;  Surgeon: Festus Aloe, MD;  Location: [facility];  Service: Urology;  Laterality: Bilateral;   EYE SURGERY Left 07/06/2022   FL INJ LEFT KNEE CT ARTHROGRAM ([facility] HX) Left    1995   GASTRIC BYPASS  2010   HAND SURGERY Left 01/19/2021   Thumb   HEMORRHOID SURGERY  2013   KNEE ARTHROSCOPY Left 1996   TONSILLECTOMY     TOTAL ABDOMINAL HYSTERECTOMY W/ BILATERAL SALPINGOOPHORECTOMY      Home Medications:  Allergies as of 10/25/2022       Reactions   Avelox [moxifloxacin Hcl In Nacl] Anaphylaxis   Bactrim [sulfamethoxazole-trimethoprim] Anaphylaxis   Ciprofloxacin Other (See Comments)   Pt states she was told never to take this as it is in the same family as Avelox.    Buspar [buspirone] Other (See Comments)   hallucinations   Neomycin-polymyxin-dexameth Other (See Comments)   Burning in the eyes   Aquaphor [lanolin-petrolatum] Itching, Rash  Depakote [divalproex Sodium] Other (See Comments)   Unknown Reaction   Imitrex [sumatriptan] Other (See Comments)   Neck and shoulder pain   Stadol [butorphanol] Rash        Medication List        Accurate as of October 25, 2022  2:59 PM. If you have any questions, ask your nurse or doctor.          ALPRAZolam 0.5 MG tablet Commonly known as: Xanax Take 1.5 tablets (0.75 mg total) by mouth 2 (two) times daily.   aspirin EC 81 MG tablet Take 1 tablet (81 mg) by mouth once daily. Swallow whole.   buPROPion 150 MG 24 hr tablet Commonly known as: WELLBUTRIN XL Take 3 tablets (450 mg total) by mouth daily.   erythromycin  ophthalmic ointment Place 1 Application into the left eye 2 (two) times daily.   furosemide 20 MG tablet Commonly known as: LASIX Take 1 tablet (20 mg total) by mouth daily. May take an additional tablet (20mg ) AS NEEDED for weight gain and/or swelling.   lamoTRIgine 200 MG tablet Commonly known as: LAMICTAL Take 2 tablets (400 mg total) by mouth 2 (two) times daily.   methenamine 1 g tablet Commonly known as: Hiprex Take 1 tablet (1 g total) by mouth 2 (two) times daily with a meal.   methocarbamol 500 MG tablet Commonly known as: ROBAXIN TAKE 1 TABLET BY MOUTH EVERY 8 HOURS AS NEEDED FOR MUSCLE SPASM   multivitamin capsule Take 1 capsule by mouth daily.   ondansetron 8 MG disintegrating tablet Commonly known as: ZOFRAN-ODT Take 1 tablet (8 mg total) by mouth every 8 (eight) hours as needed for nausea or vomiting.   Premarin vaginal cream Generic drug: conjugated estrogens Apply one pea-sized amount around the opening of the urethra daily for 2 weeks, then 3 times weekly moving forward.   promethazine 25 MG tablet Commonly known as: PHENERGAN Take 1 tablet (25 mg total) by mouth every 8 (eight) hours as needed for nausea or vomiting.   QUEtiapine 300 MG tablet Commonly known as: SEROQUEL Take 1 tablet (300 mg total) by mouth at bedtime.   rOPINIRole 1 MG tablet Commonly known as: REQUIP Take 1 tablet (1 mg total) by mouth at bedtime.   rosuvastatin 5 MG tablet Commonly known as: Crestor Take 1 tablet (5 mg total) by mouth daily.   sertraline 100 MG tablet Commonly known as: ZOLOFT Take 2.5 tablets (250 mg total) by mouth daily.   tirzepatide 5 MG/0.5ML Pen Commonly known as: MOUNJARO Inject 5 mg into the skin once a week.   vedolizumab 300 MG injection Commonly known as: ENTYVIO Inject into the vein. Every 4 weeks        Allergies:  Allergies  Allergen Reactions   Avelox [Moxifloxacin Hcl In Nacl] Anaphylaxis   Bactrim [Sulfamethoxazole-Trimethoprim]  Anaphylaxis   Ciprofloxacin Other (See Comments)    Pt states she was told never to take this as it is in the same family as Avelox.    Buspar [Buspirone] Other (See Comments)    hallucinations   Neomycin-Polymyxin-Dexameth Other (See Comments)    Burning in the eyes   Aquaphor [Lanolin-Petrolatum] Itching and Rash   Depakote [Divalproex Sodium] Other (See Comments)    Unknown Reaction   Imitrex [Sumatriptan] Other (See Comments)    Neck and shoulder pain   Stadol [Butorphanol] Rash    Family History: Family History  Problem Relation Age of Onset   Colon cancer Mother  Stroke Father    Heart failure Sister    Bladder Cancer Neg Hx    Kidney disease Neg Hx    Prostate cancer Neg Hx    Kidney cancer Neg Hx    Pancreatic cancer Neg Hx    Esophageal cancer Neg Hx    Stomach cancer Neg Hx    Rectal cancer Neg Hx    Breast cancer Neg Hx     Social History:   reports that she quit smoking about 47 years ago. Her smoking use included cigarettes. She smoked an average of .5 packs per day. She has never used smokeless tobacco. She reports that she does not drink alcohol and does not use drugs.  Physical Exam: BP 115/79   Pulse (!) 118   Ht 5' (1.524 m)   Wt 121 lb (54.9 kg)   LMP  (LMP Unknown)   BMI 23.63 kg/m   Constitutional:  Alert and oriented, no acute distress, nontoxic appearing HEENT: [place], AT Cardiovascular: No clubbing, cyanosis, or edema Respiratory: Normal respiratory effort, no increased work of breathing Skin: No rashes, bruises or suspicious lesions Neurologic: Grossly intact, no focal deficits, moving all 4 extremities Psychiatric: Normal mood and affect  Laboratory Data: See Epic Results for orders placed or performed in visit on 10/25/22  BLADDER SCAN AMB NON-IMAGING  Result Value Ref Range   Scan Result 66ml    Assessment & Plan:   1. Gross hematuria Acute worsening in painless hematuria.  UA with stable pyuria and bacteriuria consistent with  suspected colonization.  At this point I recommend pursuing hematuria workup and she agreed.  We discussed the differential for gross hematuria, including infection, stones, cysts, anticoagulation, and urologic malignancies.  We discussed that CT urogram and cystoscopy are complementary and work together to fully evaluate the urinary tract.    Will send urine for culture, but again I suspect she will grow Klebsiella consistent with suspected colonization.  I do not think this is likely to be the source of her worsening hematuria. - Urinalysis, Complete - CULTURE, URINE COMPREHENSIVE - CT HEMATURIA WORKUP; Future - BLADDER SCAN AMB NON-IMAGING   Return in about 4 weeks (around 11/22/2022) for Cysto and CTU results with Dr. Matilde Sprang.  Debroah Loop, PA-C  [facility] [place] [address] [place], [place] [address] [phone]

## 2022-10-26 LAB — URINALYSIS, COMPLETE
Bilirubin, UA: NEGATIVE
Ketones, UA: NEGATIVE
Nitrite, UA: POSITIVE — AB
Specific Gravity, UA: 1.015 (ref 1.005–1.030)
Urobilinogen, Ur: 0.2 mg/dL (ref 0.2–1.0)
pH, UA: 5.5 (ref 5.0–7.5)

## 2022-10-26 LAB — MICROSCOPIC EXAMINATION: RBC, Urine: >30 /hpf — AB (ref 0–2)

## 2022-10-29 ENCOUNTER — Other Ambulatory Visit: Payer: Self-pay | Admitting: Physician Assistant

## 2022-10-29 ENCOUNTER — Ambulatory Visit
Admission: RE | Admit: 2022-10-29 | Discharge: 2022-10-29 | Disposition: A | Discharge status: Home / Self Care | Payer: BC Managed Care – PPO | Source: Ambulatory Visit | Attending: Gastroenterology | Admitting: Gastroenterology

## 2022-10-29 ENCOUNTER — Ambulatory Visit: Payer: BC Managed Care – PPO | Admitting: Nurse Practitioner

## 2022-10-29 ENCOUNTER — Other Ambulatory Visit: Payer: Self-pay | Admitting: Gastroenterology

## 2022-10-29 DIAGNOSIS — G8929 Other chronic pain: Secondary | ICD-10-CM

## 2022-10-29 DIAGNOSIS — R1011 Right upper quadrant pain: Secondary | ICD-10-CM | POA: Insufficient documentation

## 2022-10-29 DIAGNOSIS — R935 Abnormal findings on diagnostic imaging of other abdominal regions, including retroperitoneum: Secondary | ICD-10-CM | POA: Diagnosis not present

## 2022-10-29 DIAGNOSIS — R7989 Other specified abnormal findings of blood chemistry: Secondary | ICD-10-CM | POA: Diagnosis not present

## 2022-10-29 DIAGNOSIS — K838 Other specified diseases of biliary tract: Secondary | ICD-10-CM | POA: Diagnosis not present

## 2022-10-29 MED ORDER — GADOBUTROL 1 MMOL/ML IV SOLN
6.0000 mL | Freq: Once | INTRAVENOUS | Status: AC | PRN
  Administered 2022-10-29: 6 mL via INTRAVENOUS

## 2022-10-30 ENCOUNTER — Ambulatory Visit: Payer: BC Managed Care – PPO | Admitting: Nurse Practitioner

## 2022-10-30 ENCOUNTER — Encounter: Payer: Self-pay | Admitting: Nurse Practitioner

## 2022-10-30 VITALS — BP 105/69 | HR 99 | Temp 99.2°F | Wt 117.7 lb

## 2022-10-30 DIAGNOSIS — F13930 Sedative, hypnotic or anxiolytic use, unspecified with withdrawal, uncomplicated: Secondary | ICD-10-CM

## 2022-10-30 DIAGNOSIS — Z8673 Personal history of transient ischemic attack (TIA), and cerebral infarction without residual deficits: Secondary | ICD-10-CM | POA: Diagnosis not present

## 2022-10-30 DIAGNOSIS — R296 Repeated falls: Secondary | ICD-10-CM | POA: Diagnosis not present

## 2022-10-30 DIAGNOSIS — R519 Headache, unspecified: Secondary | ICD-10-CM | POA: Diagnosis not present

## 2022-10-30 DIAGNOSIS — G629 Polyneuropathy, unspecified: Secondary | ICD-10-CM | POA: Diagnosis not present

## 2022-10-30 DIAGNOSIS — R410 Disorientation, unspecified: Secondary | ICD-10-CM | POA: Diagnosis not present

## 2022-10-30 LAB — CULTURE, URINE COMPREHENSIVE

## 2022-10-30 MED ORDER — ONDANSETRON 8 MG PO TBDP: 8.0000 mg | ORAL_TABLET | Freq: Three times a day (TID) | ORAL | 1 refills | Status: DC | PRN

## 2022-10-30 NOTE — Assessment & Plan Note (Signed)
Chronic.  Doing well.  Is followed by Neurology.  Continue to follow up with specialist.

## 2022-10-30 NOTE — Assessment & Plan Note (Signed)
Chronic.  Improving. Working with psychiatrist to do a slower wean.

## 2022-10-30 NOTE — Progress Notes (Signed)
BP 105/69   Pulse 99   Temp 99.2 F (37.3 C) (Oral)   Wt 117 lb 11.2 oz (53.4 kg)   LMP  (LMP Unknown)   SpO2 96%   BMI 22.99 kg/m    Subjective:    Patient ID: Amy Hansen, female    DOB: 03/27/1956, 67 y.o.   MRN: [id number]  HPI: Amy Hansen is a 67 y.o. female  Chief Complaint  Patient presents with   Fall   Patient presents to clinic today for follow up.  She has had multiple falls recently. Was able to get her MRI on 3/28 which did show that she has had another stroke.  There are some ischemic changes but those are consistent with prior MRIs in 2022.  Patient saw Neurology this morning who wants to put her on medication for her confusion and memory.  She will go back in two months for reevaluation.    States she has seen her psychiatrist and she is taking 0.75mg  of Xanax twice daily.  She feels like she is doing well with this dose.    She feels like she is doing okay right now.      Relevant past medical, surgical, family and social history reviewed and updated as indicated. Interim medical history since our last visit reviewed. Allergies and medications reviewed and updated.  Review of Systems  All other systems reviewed and are negative.   Per HPI unless specifically indicated above     Objective:    BP 105/69   Pulse 99   Temp 99.2 F (37.3 C) (Oral)   Wt 117 lb 11.2 oz (53.4 kg)   LMP  (LMP Unknown)   SpO2 96%   BMI 22.99 kg/m   Wt Readings from Last 3 Encounters:  10/30/22 117 lb 11.2 oz (53.4 kg)  10/25/22 121 lb (54.9 kg)  10/10/22 118 lb 8 oz (53.8 kg)    Physical Exam Vitals and nursing note reviewed.  Constitutional:      General: She is not in acute distress.    Appearance: Normal appearance. She is normal weight. She is not ill-appearing, toxic-appearing or diaphoretic.  HENT:     Head: Normocephalic.     Right Ear: External ear normal.     Left Ear: External ear normal.     Nose: Nose normal.     Mouth/Throat:     Mouth:  Mucous membranes are moist.     Pharynx: Oropharynx is clear.  Eyes:     General:        Right eye: No discharge.        Left eye: No discharge.     Extraocular Movements: Extraocular movements intact.     Conjunctiva/sclera: Conjunctivae normal.     Pupils: Pupils are equal, round, and reactive to light.  Cardiovascular:     Rate and Rhythm: Normal rate and regular rhythm.     Heart sounds: No murmur heard. Pulmonary:     Effort: Pulmonary effort is normal. No respiratory distress.     Breath sounds: Normal breath sounds. No wheezing or rales.  Musculoskeletal:     Cervical back: Normal range of motion and neck supple.  Skin:    General: Skin is warm and dry.     Capillary Refill: Capillary refill takes less than 2 seconds.  Neurological:     General: No focal deficit present.     Mental Status: She is alert and oriented to person, place, and time. Mental  status is at baseline.  Psychiatric:        Mood and Affect: Mood normal.        Behavior: Behavior normal.        Thought Content: Thought content normal.        Judgment: Judgment normal.     Results for orders placed or performed in visit on 10/25/22  CULTURE, URINE COMPREHENSIVE   Specimen: Urine   UR  Result Value Ref Range   Urine Culture, Comprehensive Preliminary report (A)    Organism ID, Bacteria Escherichia coli (A)   Microscopic Examination   Urine  Result Value Ref Range   WBC, UA 6-10 (A) 0 - 5 /hpf   RBC, Urine >30 (A) 0 - 2 /hpf   Epithelial Cells (non renal) 0-10 0 - 10 /hpf   Bacteria, UA Many (A) None seen/Few  Urinalysis, Complete  Result Value Ref Range   Specific Gravity, UA 1.015 1.005 - 1.030   pH, UA 5.5 5.0 - 7.5   Color, UA Red (A) Yellow   Appearance Ur Cloudy (A) Clear   Leukocytes,UA Trace (A) Negative   Protein,UA 2+ (A) Negative/Trace   Glucose, UA Trace (A) Negative   Ketones, UA Negative Negative   RBC, UA 3+ (A) Negative   Bilirubin, UA Negative Negative   Urobilinogen, Ur  0.2 0.2 - 1.0 mg/dL   Nitrite, UA Positive (A) Negative   Microscopic Examination See below:   BLADDER SCAN AMB NON-IMAGING  Result Value Ref Range   Scan Result 95ml       Assessment & Plan:   Problem List Items Addressed This Visit       Other   History of TIA (transient ischemic attack)    Chronic.  Doing well.  Is followed by Neurology.  Continue to follow up with specialist.       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  Improving. Working with psychiatrist to do a slower wean.          Follow up plan: Return in about 3 months (around 01/29/2023) for HTN, HLD, DM2 FU.

## 2022-10-31 ENCOUNTER — Telehealth: Payer: Self-pay | Admitting: Physician Assistant

## 2022-10-31 ENCOUNTER — Other Ambulatory Visit: Payer: Self-pay | Admitting: *Deleted

## 2022-10-31 ENCOUNTER — Ambulatory Visit: Payer: BC Managed Care – PPO | Admitting: Physician Assistant

## 2022-10-31 MED ORDER — CEFUROXIME AXETIL 250 MG PO TABS: 250.0000 mg | ORAL_TABLET | Freq: Two times a day (BID) | ORAL | 0 refills | Status: AC

## 2022-10-31 NOTE — Telephone Encounter (Signed)
Pt saw U/A and culture results in [place].  She wants to know if she has infection, if we can call something in.

## 2022-11-01 ENCOUNTER — Ambulatory Visit (INDEPENDENT_AMBULATORY_CARE_PROVIDER_SITE_OTHER): Payer: BC Managed Care – PPO

## 2022-11-01 VITALS — BP 121/80 | HR 84 | Temp 97.9°F | Resp 18 | Ht 60.0 in | Wt 115.2 lb

## 2022-11-01 DIAGNOSIS — K5100 Ulcerative (chronic) pancolitis without complications: Secondary | ICD-10-CM

## 2022-11-01 MED ORDER — VEDOLIZUMAB 300 MG IV SOLR
300.0000 mg | Freq: Once | INTRAVENOUS | Status: AC
  Administered 2022-11-01: 300 mg via INTRAVENOUS
  Filled 2022-11-01: qty 5

## 2022-11-01 NOTE — Telephone Encounter (Signed)
Replied, see results note.

## 2022-11-01 NOTE — Progress Notes (Signed)
Diagnosis: Pancolitis  Provider:  Marshell Garfinkel MD  Procedure: Infusion  IV Type: Peripheral, IV Location: L Forearm  Entyvio (Vedolizumab), Dose: 300 mg  Infusion Start Time: 1101  Infusion Stop Time: [id number]  Post Infusion IV Care: Peripheral IV Discontinued  Discharge: Condition: Good, Destination: Home . AVS Provided  Performed by:  Arnoldo Morale, RN

## 2022-11-05 ENCOUNTER — Encounter: Payer: Self-pay | Admitting: Gastroenterology

## 2022-11-05 ENCOUNTER — Other Ambulatory Visit: Payer: Self-pay

## 2022-11-05 ENCOUNTER — Ambulatory Visit: Payer: BC Managed Care – PPO | Admitting: Gastroenterology

## 2022-11-05 VITALS — BP 104/64 | HR 105 | Ht 60.0 in | Wt 117.2 lb

## 2022-11-05 DIAGNOSIS — K523 Indeterminate colitis: Secondary | ICD-10-CM | POA: Diagnosis not present

## 2022-11-05 DIAGNOSIS — D509 Iron deficiency anemia, unspecified: Secondary | ICD-10-CM

## 2022-11-05 DIAGNOSIS — R7989 Other specified abnormal findings of blood chemistry: Secondary | ICD-10-CM | POA: Diagnosis not present

## 2022-11-05 DIAGNOSIS — R112 Nausea with vomiting, unspecified: Secondary | ICD-10-CM

## 2022-11-05 MED ORDER — ROPINIROLE HCL 1 MG PO TABS: 1.0000 mg | ORAL_TABLET | Freq: Every day | ORAL | 1 refills | Status: DC

## 2022-11-05 NOTE — Patient Instructions (Addendum)
______________________________________________________  If your blood pressure at your visit was 140/90 or greater, please contact your primary care physician to follow up on this.  _______________________________________________________  If you are age 66 or older, your body mass index should be between 23-30. Your Body mass index is 22.9 kg/m. If this is out of the aforementioned range listed, please consider follow up with your Primary Care Provider.  If you are age 28 or younger, your body mass index should be between 19-25. Your Body mass index is 22.9 kg/m. If this is out of the aformentioned range listed, please consider follow up with your Primary Care Provider.   ________________________________________________________  The [facility] providers would like to encourage you to use [facility] to communicate with providers for non-urgent requests or questions.  Due to long hold times on the telephone, sending your provider a message by [facility] may be a faster and more efficient way to get a response.  Please allow 48 business hours for a response.  Please remember that this is for non-urgent requests.  _______________________________________________________  Please go to the lab in 2 weeks for LFTs.  You will be due for additional labs in 2 months. We will remind you when it is time to go.  Thank you for entrusting me with your care and for choosing [facility], Dr. Ileene Patrick

## 2022-11-05 NOTE — Progress Notes (Signed)
HPI : last seen December 2023  67 year old female here for a follow-up visit for colitis, elevated liver enzymes, nausea / poor appetite, IDA.   Colitis History: Admitted October 2021 for iron deficiency anemia and diarrhea/colitis.  She had an infectious work-up performed which was negative.  CT scan showed pancolitis at the time.  She underwent a colonoscopy with Dr. Christella HartiganJacobs showing focal active nonspecific ileitis (on biopsies only - endoscopically ileum was normal), patchy nonspecific colitis in the right and left colon on biopsies, pancolonic inflammation grossly.  She was started on prednisone and then transitioned eventually to Lialda maintenance therapy. Colonoscopy with me on March 2022 for surveillance, showed a normal ileum, but with pancolitis that is active, worse in the left colon.  Biopsies showed chronic active colitis throughout.  She was hospitalized 7/13 through 02/10/2021 with acute exacerbation of diarrhea.  There was suspicion for C. difficile and she had been started on empiric vancomycin just prior to admission as well as prednisone.  GI path panel was negative and C. difficile quick screen also negative.  She did complete a course of vancomycin. [facility]. Has responded well to it, had some mild active disease on surveillance colonoscopy 2023 with mildly low Entyvio levels, dosing increased to every 4 weeks.   SINCE THE LAST VISIT:  Patient has continued Entyvio every 4 weeks since have last seen her.  She reports her bowels have been very well-controlled and she denies any diarrhea.  She had a fecal calprotectin done since her last visit, February 27, with a value of 59.  This is markedly improved compared to her prior value when diagnosed which was almost 6000.  She appears to be tolerating it well and is quite happy with the regimen.  Recall that she has been on IV iron in the past for iron deficiency.  She has a  history of Roux-en-Y gastric bypass, she has a history of marginal ulcers there, colonoscopy had last showed interval healing.  Iron deficiency thought to be related to marginal ulcers in her gastric bypass status.  On IV iron and her anemia and iron studies normalized.  She has been on omeprazole capsules 40 mg opened prior to ingestion daily.  She continues to have poor appetite with nausea.  No significant vomiting.  She has lost some weight.  She is not eating well.  She has diabetes and has been on Mounjaro now for some time.  We had discussed having her speak with her primary care about this and switching to another regimen given her upper tract symptoms, she has not done this yet.  She is now down to 117 pounds, BMI 22.9.  Otherwise, she is here to discuss her liver enzymes today.  Her liver enzymes were normal in 2023, in January her ALT went to 70, AST 72, alk phos of 150.  End of January alk phos that increased to 204, AST and LT in the 50s.  She last had it checked 2 weeks ago, alk phos, increased a bit further to 229, GGT elevated, AST 62, ALT 47.  Recall in 2021 2022 she also had a similar enzyme elevation at the time.  She had a full serologic workup which was negative for any clear cause for chronic liver disease, her autoimmune workup was unremarkable.  Recall she had an equivocal elevation in antimitochondrial antibody in 2022, in 2023 it was repeated and negative.  IgG levels have remained normal.  She  had a positive ANA 2 years ago, most recently was -9 months ago.  She has had viral testing negative for hepatitis B and C.  Negative smooth muscle antibody in 2021.  She reports being on Lamictal chronically as well as Seroquel over the past 6 to 12 months.  No other really new medicine she has been on over the past year.  She denies any over-the-counter supplements. She is not using any NSAIDs.  She had a right upper quadrant ultrasound that looked okay, followed up with an MRCP which  showed mild CBD dilation in the setting of cholecystectomy but otherwise normal exam.     Prior work-up  Colonoscopy 05/03/2020: - Mild to moderate inflammation from anus to cecum with a normal appearing terminal ileum. Multiple biopsies taken.   A. SMALL BOWEL, TERMINAL ILEUM, BIOPSY:  - Focally active nonspecific ileitis  - Negative for features of chronicity or granulomas   B. COLON, RIGHT, BIOPSY:  - Patchy active nonspecific colitis with focal ulceration  - Focal microscopic aggregate of epithelioid histiocytes suggestive of a  microadenoma  - See comment   C. COLON, LEFT, BIOPSY:  - Patchy mildly active nonspecific colitis  - Negative for features of chronicity or granulomas  COMMENT:   A, B and C.   Taken together, the ileocolonic biopsies show patchy  active colitis with microgranulomas within the right colon.  Diagnostic  histologic features of idiopathic inflammatory bowel disease, including  lamina propria and basal lymphoplasmacytosis or crypt architectural  distortion are not present.  Differential diagnosis can include  infection, drug effect and evolving/early inflammatory bowel disease  (e.g. Crohn's disease).  AFB and GMS special stains are negative for  acid-fast bacilli and fungal organisms respectively.  Clinical-radiologic correlation and patient follow-up is suggested.      CTAP with contrast 04/21/2020: 1. Diffuse circumferential wall thickening of virtually all of the colon, most notably at the level of the cecum and ascending colon, consistent with infectious or inflammatory colitis. 2. There is a punctate focus of gas within the urinary bladder. Correlate for history of recent instrumentation.  Aortic Atherosclerosis      Colonoscopy 10/06/20 - The perianal and digital rectal examinations were normal. - The terminal ileum appeared normal. - Patchy inflammation characterized by erosions, granularity, mucus and aphthous ulcerations was found in the  entire colon, most prominent in the left colon. Biopsies were taken with a cold forceps for histology. - Scattered medium-mouthed diverticula were found in the entire colon. - The exam was otherwise without abnormality.   1. Surgical [P], random right sided sites - CHRONIC ACTIVE COLITIS - NO GRANULOMATA, DYSPLASIA OR MALIGNANCY IDENTIFIED 2. Surgical [P], colon, transverse - CHRONIC ACTIVE COLITIS - NO GRANULOMATA, DYSPLASIA OR MALIGNANCY IDENTIFIED 3. Surgical [P], random left sided sites - CHRONIC ACTIVE COLITIS - NO GRANULOMATA, DYSPLASIA OR MALIGNANCY IDENTIFIED       Colonoscopy 12/26/21: - The perianal and digital rectal examinations were normal. - The terminal ileum appeared normal. - Multiple small-mouthed diverticula were found in the entire colon. - Localized mild inflammation characterized by erosions and erythema was found in the transverse colon in one very focal area. Biopsies were taken with a cold forceps for histology. - A 4 mm polyp was found in the distal rectum. The polyp was sessile. The polyp was removed with a cold biopsy forceps. Resection and retrieval were complete. - The exam was otherwise without abnormality in regards to inflammatory burden. No overt inflammation seen elsewhere. There was significant residual  stool. The prep was adequate enough to assess for inflammatory changes but small or flat lesions may not have been appreciated in some portions of the colon. - Biopsies were taken with a cold forceps for histology in the right, transverse, and left colon.   1. Surgical [P], random right colon sites - COLONIC MUCOSA WITH NO SPECIFIC HISTOPATHOLOGIC CHANGES - NEGATIVE FOR ACUTE INFLAMMATION, FEATURES OF CHRONICITY, GRANULOMAS OR DYSPLASIA 2. Surgical [P], colon, transverse - INACTIVE CHRONIC COLITIS, MANIFESTED ONLY BY FOCAL MILD ARCHITECTURAL CHANGES - NEGATIVE FOR GRANULOMAS OR DYSPLASIA 3. Surgical [P], random left colon sites - COLONIC MUCOSA  WITH NO SPECIFIC HISTOPATHOLOGIC CHANGES - NEGATIVE FOR ACUTE INFLAMMATION, FEATURES OF CHRONICITY, GRANULOMAS OR DYSPLASIA 4. Surgical [P], colon, rectum, polyp (1) - HYPERPLASTIC POLYP     Entyvio levels 01/17/22 - level of 7.7, AB undetectable - increased dosing to q 4 weeks this past summer- July   EGD 05/02/22: - The exam of the esophagus was otherwise normal. - Evidence of a Roux-en-Y gastrojejunostomy was found. The gastrojejunal anastomosis was characterized by 2 small areas of ulceration. - The gastric pouch was otherwise small but normal. - Biopsies were taken with a cold forceps for Helicobacter pylori testing. - The examined small bowel limb and blind loop were normal.  Fecal calprotectin 09/25/22 - 59  Lab Results  Component Value Date   ALT 47 (H) 10/18/2022   AST 62 (H) 10/18/2022   ALKPHOS 229 (H) 10/18/2022   BILITOT 0.3 10/18/2022   GGT elevated to 70s  Prior serologic workup negative for this  RUQ [name] 09/15/22 - normal  MRCP 10/30/22: IMPRESSION: Prior cholecystectomy.   Mild biliary ductal dilatation, with common bile duct measuring 12 mm. No radiographic evidence of choledocholithiasis, stricture, or other obstructing etiology.    Past Medical History:  Diagnosis Date   Anemia    Anxiety    Arthritis    Blood transfusion without reported diagnosis    Cataract    CHF (congestive heart failure)    Chronic kidney disease    UTI, hematuria in urine   Colitis    Crohn's disease    Depression    Diabetes    Diverticulosis    Frequent headaches    Interstitial cystitis    Recurrent UTI    Restless leg syndrome    TIA (transient ischemic attack)    more than year ago per pt 12-26-21   TIA (transient ischemic attack) 02/20/2021   Urinary frequency      Past Surgical History:  Procedure Laterality Date   bariatric bypass  2012   BIOPSY  05/03/2020   Procedure: BIOPSY;  Surgeon: Rachael Fee, MD;  Location: [facility] ENDOSCOPY;  Service:  Endoscopy;;   CARPAL TUNNEL RELEASE Right 2003   CARPAL TUNNEL RELEASE Right    2008   CHOLECYSTECTOMY  1975   COLONOSCOPY     COLONOSCOPY WITH PROPOFOL N/A 05/03/2020   Procedure: COLONOSCOPY WITH PROPOFOL;  Surgeon: Rachael Fee, MD;  Location: [facility] ENDOSCOPY;  Service: Endoscopy;  Laterality: N/A;   CYSTOSCOPY W/ RETROGRADES Bilateral 06/06/2015   Procedure: CYSTOSCOPY WITH RETROGRADE PYELOGRAM;  Surgeon: Jerilee Field, MD;  Location: [facility] ORS;  Service: Urology;  Laterality: Bilateral;   EYE SURGERY Left 07/06/2022   FL INJ LEFT KNEE CT ARTHROGRAM ([facility] HX) Left    1995   GASTRIC BYPASS  2010   HAND SURGERY Left 01/19/2021   Thumb   HEMORRHOID SURGERY  2013   KNEE ARTHROSCOPY Left 1996  TONSILLECTOMY     TOTAL ABDOMINAL HYSTERECTOMY W/ BILATERAL SALPINGOOPHORECTOMY     Family History  Problem Relation Age of Onset   Colon cancer Mother    Stroke Father    Heart failure Sister    Bladder Cancer Neg Hx    Kidney disease Neg Hx    Prostate cancer Neg Hx    Kidney cancer Neg Hx    Pancreatic cancer Neg Hx    Esophageal cancer Neg Hx    Stomach cancer Neg Hx    Rectal cancer Neg Hx    Breast cancer Neg Hx    Social History   Tobacco Use   Smoking status: Former    Packs/day: .5    Types: Cigarettes    Quit date: 04/25/1975    Years since quitting: 47.5   Smokeless tobacco: Never   Tobacco comments:    quit 40 years  Vaping Use   Vaping Use: Never used  Substance Use Topics   Alcohol use: No    Alcohol/week: 0.0 standard drinks of alcohol   Drug use: No   Current Outpatient Medications  Medication Sig Dispense Refill   ALPRAZolam (XANAX) 0.5 MG tablet Take 1.5 tablets (0.75 mg total) by mouth 2 (two) times daily. 90 tablet 0   aspirin EC 81 MG tablet Take 1 tablet (81 mg) by mouth once daily. Swallow whole.     buPROPion (WELLBUTRIN XL) 150 MG 24 hr tablet Take 3 tablets (450 mg total) by mouth daily. 270 tablet 3   cefUROXime (CEFTIN) 250 MG tablet Take  1 tablet (250 mg total) by mouth 2 (two) times daily with a meal for 7 days. 14 tablet 0   conjugated estrogens (PREMARIN) vaginal cream Apply one pea-sized amount around the opening of the urethra daily for 2 weeks, then 3 times weekly moving forward. 30 g 4   furosemide (LASIX) 20 MG tablet Take 1 tablet (20 mg total) by mouth daily. May take an additional tablet (20mg ) AS NEEDED for weight gain and/or swelling. 90 tablet 3   lamoTRIgine (LAMICTAL) 200 MG tablet Take 2 tablets (400 mg total) by mouth 2 (two) times daily. 60 tablet 11   ondansetron (ZOFRAN-ODT) 8 MG disintegrating tablet Take 1 tablet (8 mg total) by mouth every 8 (eight) hours as needed for nausea or vomiting. 60 tablet 1   promethazine (PHENERGAN) 25 MG tablet Take 1 tablet (25 mg total) by mouth every 8 (eight) hours as needed for nausea or vomiting. 20 tablet 0   QUEtiapine (SEROQUEL) 300 MG tablet Take 1 tablet (300 mg total) by mouth at bedtime. 30 tablet 5   rOPINIRole (REQUIP) 1 MG tablet Take 1 tablet (1 mg total) by mouth at bedtime. 90 tablet 1   rosuvastatin (CRESTOR) 5 MG tablet Take 1 tablet (5 mg total) by mouth daily. 90 tablet 1   sertraline (ZOLOFT) 100 MG tablet Take 2.5 tablets (250 mg total) by mouth daily. 75 tablet 0   tirzepatide (MOUNJARO) 5 MG/0.5ML Pen Inject 5 mg into the skin once a week. 6 mL 1   vedolizumab (ENTYVIO) 300 MG injection Inject into the vein. Every 4 weeks     No current facility-administered medications for this visit.   Allergies  Allergen Reactions   Avelox [Moxifloxacin Hcl In Nacl] Anaphylaxis   Bactrim [Sulfamethoxazole-Trimethoprim] Anaphylaxis   Ciprofloxacin Other (See Comments)    Pt states she was told never to take this as it is in the same family as Avelox.  Buspar [Buspirone] Other (See Comments)    hallucinations   Neomycin-Polymyxin-Dexameth Other (See Comments)    Burning in the eyes   Aquaphor [Lanolin-Petrolatum] Itching and Rash   Depakote [Divalproex  Sodium] Other (See Comments)    Unknown Reaction   Imitrex [Sumatriptan] Other (See Comments)    Neck and shoulder pain   Stadol [Butorphanol] Rash     Review of Systems: All systems reviewed and negative except where noted in HPI.    MR ABDOMEN MRCP W WO CONTAST  Result Date: 10/30/2022 CLINICAL DATA:  Elevated liver function tests. Elevated alkaline phosphatase level. Abdominal pain. Prior cholecystectomy. EXAM: MRI ABDOMEN WITHOUT AND WITH CONTRAST (INCLUDING MRCP) TECHNIQUE: Multiplanar multisequence MR imaging of the abdomen was performed both before and after the administration of intravenous contrast. Heavily T2-weighted images of the biliary and pancreatic ducts were obtained, and three-dimensional MRCP images were rendered by post processing. CONTRAST:  6mL GADAVIST GADOBUTROL 1 MMOL/ML IV SOLN COMPARISON:  None Available. FINDINGS: Lower chest: No acute findings. Hepatobiliary: No hepatic masses identified. No evidence of steatosis. Prior cholecystectomy noted. Mild biliary ductal dilatation is seen with common bile duct measuring 12 mm in diameter, however there is no evidence of choledocholithiasis, stricture, or other obstructing etiology. Pancreas: No mass or inflammatory changes. No evidence of pancreatic ductal dilatation or pancreas divisum. Spleen:  Within normal limits in size and appearance. Adrenals/Urinary Tract: No suspicious masses identified. No evidence of hydronephrosis. Stomach/Bowel: Unremarkable. Vascular/Lymphatic: No pathologically enlarged lymph nodes identified. No acute vascular findings. Other:  None. Musculoskeletal:  No suspicious bone lesions identified. IMPRESSION: Prior cholecystectomy. Mild biliary ductal dilatation, with common bile duct measuring 12 mm. No radiographic evidence of choledocholithiasis, stricture, or other obstructing etiology. Electronically Signed   By: Danae Orleans M.D.   On: 10/30/2022 07:54   MR 3D Recon At Scanner  Result Date:  10/30/2022 CLINICAL DATA:  Elevated liver function tests. Elevated alkaline phosphatase level. Abdominal pain. Prior cholecystectomy. EXAM: MRI ABDOMEN WITHOUT AND WITH CONTRAST (INCLUDING MRCP) TECHNIQUE: Multiplanar multisequence MR imaging of the abdomen was performed both before and after the administration of intravenous contrast. Heavily T2-weighted images of the biliary and pancreatic ducts were obtained, and three-dimensional MRCP images were rendered by post processing. CONTRAST:  6mL GADAVIST GADOBUTROL 1 MMOL/ML IV SOLN COMPARISON:  None Available. FINDINGS: Lower chest: No acute findings. Hepatobiliary: No hepatic masses identified. No evidence of steatosis. Prior cholecystectomy noted. Mild biliary ductal dilatation is seen with common bile duct measuring 12 mm in diameter, however there is no evidence of choledocholithiasis, stricture, or other obstructing etiology. Pancreas: No mass or inflammatory changes. No evidence of pancreatic ductal dilatation or pancreas divisum. Spleen:  Within normal limits in size and appearance. Adrenals/Urinary Tract: No suspicious masses identified. No evidence of hydronephrosis. Stomach/Bowel: Unremarkable. Vascular/Lymphatic: No pathologically enlarged lymph nodes identified. No acute vascular findings. Other:  None. Musculoskeletal:  No suspicious bone lesions identified. IMPRESSION: Prior cholecystectomy. Mild biliary ductal dilatation, with common bile duct measuring 12 mm. No radiographic evidence of choledocholithiasis, stricture, or other obstructing etiology. Electronically Signed   By: Danae Orleans M.D.   On: 10/30/2022 07:54   MR Brain W Wo Contrast  Result Date: 10/23/2022 CLINICAL DATA:  Provided history: Confusion. Mental status change, persistent or worsening. EXAM: MRI HEAD WITHOUT AND WITH CONTRAST TECHNIQUE: Multiplanar, multiecho pulse sequences of the brain and surrounding structures were obtained without and with intravenous contrast. CONTRAST:   5mL GADAVIST GADOBUTROL 1 MMOL/ML IV SOLN COMPARISON:  Head CT 03/02/2022. MR  face/trigeminal 02/19/2022. Brain MRI 03/16/2021. FINDINGS: Brain: No age advanced or lobar predominant parenchymal atrophy. 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 Mild-to-moderate multifocal T2 FLAIR hyperintense signal abnormality within the cerebral white matter, nonspecific but most often secondary to chronic small vessel ischemia. Tiny chronic lacunar infarct within the right thalamus, new from the prior brain MRI of 03/16/2021 (series 15, image 28). There is no acute infarct. No evidence of an intracranial mass. No chronic intracranial blood products. No extra-axial fluid collection. No midline shift. No pathologic intracranial enhancement identified. Vascular: Maintained flow voids within the proximal large arterial vessels. Skull and upper cervical spine: No focal suspicious marrow lesion. Sinuses/Orbits: No mass or acute finding within the imaged orbits. No significant paranasal sinus disease. IMPRESSION: 1.  No evidence of an acute intracranial abnormality. 2. 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 3. Mild-to-moderate multifocal T2 FLAIR hyperintense signal abnormality within the cerebral white matter, nonspecific but most often secondary to chronic small vessel ischemia. These findings are similar to the prior brain MRI of 03/16/2021. 4. Tiny chronic lacunar infarct within the right thalamus, from the prior MRI. Electronically Signed   By: Jackey Loge D.O.   On: 10/23/2022 18:52    Physical Exam: BP 104/64   Pulse (!) 105   Ht 5' (1.524 m)   Wt 117 lb 4 oz (53.2 kg)   LMP  (LMP Unknown)   SpO2 98%   BMI 22.90 kg/m  Constitutional: Pleasant,well-developed, female in no acute distress. Neurological: Alert and oriented to person place and time. Psychiatric: Normal mood and affect. Behavior is normal.   ASSESSMENT: 67  y.o. female here for assessment of the following  1. Elevated LFTs   2. Indeterminate colitis   3. Iron deficiency anemia, unspecified iron deficiency anemia type   4. Nausea and vomiting, unspecified vomiting type    As above, mixed mild transaminitis and elevated alk phos back in 2021, 2022 which resolved, workup negative at that time.  2023 her LFTs were normal, again this year in recent months she has had mild transaminitis with elevated alk phos.  She has had a workup in the past for autoimmune liver disease which has been negative.  Imaging with MRCP shows mildly dilated CBD in the setting of postcholecystectomy status, otherwise normal exam.  We discussed differential diagnosis.  Potentially could have DILI, reviewed her medications with her, Lamictal perhaps would be the most likely to cause this although again she has been on this for quite a long time. Seroquel can also rarely do this, has been on it for the past year or so.  We discussed options, consideration for liver biopsy if this persists and elevates over time.  On the last few draws her ALT has come down while alk phos has gone up.  Will repeat this again in 2 to 3 weeks.  If enzymes are worsening she will be agreeable to a liver biopsy if needed, we discussed risk benefits of that.  If it downtrends or normalizes we may continue to monitor for some time.  Regarding her colitis, clinically doing really well on higher dosing of Entyvio, her fecal calprotectin is now normal.  Has had a very nice response we will continue that for now. In general not common to cause elevated liver enzymes but possible, will monitor as above.  Regarding her IDA, likely related to marginal ulcer in the setting of gastric bypass.  Continue omeprazole.  Continue  IV iron as needed.  She will come back for labs again in 2 months to reassess her blood counts and iron studies.  Otherwise continues to have nausea and decreased appetite in the setting of Mounjaro  use.  Again I discussed that I do not think this is a good regimen for her to be on in light of the symptoms and recommend she talk with her primary care about stopping it.  Her BMI is down to 22.9.  She will follow-up with her primary care to discuss diabetes regimen in this light.   PLAN: - LFTs in 2 weeks - consider liver biopsy if persistent elevation. Lamictal or seroquel could be most likely culprit on med list to cause this but has been on both for some time - continue Entyvio every 4 weeks - fecal calprotectin is reassuring and symtoms controlled - repeat CBC and TIBC / ferritin in 2 months, IV iron PRN, continue omeprazole - talk with PCP about stopping Mounjaro - continued upper tract symptoms I think related, she would feel  better on a different DM regimen. She agrees.  Harlin Rain, MD [facility]

## 2022-11-07 ENCOUNTER — Ambulatory Visit (INDEPENDENT_AMBULATORY_CARE_PROVIDER_SITE_OTHER): Payer: BC Managed Care – PPO | Admitting: Physician Assistant

## 2022-11-08 ENCOUNTER — Encounter: Payer: Self-pay | Admitting: Physician Assistant

## 2022-11-08 ENCOUNTER — Other Ambulatory Visit: Payer: Self-pay | Admitting: Nurse Practitioner

## 2022-11-08 ENCOUNTER — Ambulatory Visit: Payer: BC Managed Care – PPO | Admitting: Physician Assistant

## 2022-11-08 DIAGNOSIS — G2581 Restless legs syndrome: Secondary | ICD-10-CM

## 2022-11-08 DIAGNOSIS — F411 Generalized anxiety disorder: Secondary | ICD-10-CM | POA: Diagnosis not present

## 2022-11-08 DIAGNOSIS — F319 Bipolar disorder, unspecified: Secondary | ICD-10-CM

## 2022-11-08 DIAGNOSIS — F1320 Sedative, hypnotic or anxiolytic dependence, uncomplicated: Secondary | ICD-10-CM

## 2022-11-08 DIAGNOSIS — G4700 Insomnia, unspecified: Secondary | ICD-10-CM

## 2022-11-08 MED ORDER — ALPRAZOLAM 0.5 MG PO TABS: 0.7500 mg | ORAL_TABLET | Freq: Two times a day (BID) | ORAL | 0 refills | Status: DC

## 2022-11-08 NOTE — Progress Notes (Signed)
[facility]  Patient ID: Amy Hansen,  MRN: [id number]  PCP: [name], NP  Date of Evaluation: 11/08/2022  Time spent:30 minutes  Chief Complaint:  Chief Complaint   Anxiety; Follow-up    HISTORY/CURRENT STATUS: Anxiety f/u.   Worries about having another stroke. Saw neuro in f/u. See their note. Dementia is stable.  She is understandably worried about that.  But does feel more hopeful now that she knows exactly what is going on.   Cystoscopy is sched for next week due to gross hematuria.  She also has elevated LFTs and may need a liver biopsy.  So a lot of health scares going on that affect the anxiety and the weaning process of Xanax.  Husband has xanax locked in gun case.  He gives her enough for the day but that is all.  She has not had any extreme withdrawals like she had several weeks ago.  She does still have anxiety but no panic attacks.  Gets overwhelmed and has a sense of dread.  Xanax is still helpful.  Patient is able to enjoy things.  Energy and motivation are fair to good depending on the day.  Work is going okay.  No extreme sadness, tearfulness, or feelings of hopelessness.  Sleeps well most of the time. ADLs and personal hygiene are normal.   Denies any changes in remembering things.  She is at her baseline.  Appetite has not changed.  Weight is stable.  Denies suicidal or homicidal thoughts.  Patient denies increased energy with decreased need for sleep, increased talkativeness, racing thoughts, impulsivity or risky behaviors, increased spending, increased libido, grandiosity, increased irritability or anger, paranoia, or hallucinations.  Review of Systems  Constitutional: Negative.   HENT: Negative.    Eyes: Negative.   Respiratory: Negative.    Cardiovascular: Negative.   Gastrointestinal: Negative.   Genitourinary:  Positive for hematuria.  Musculoskeletal: Negative.   Skin: Negative.   Neurological:        See neuro notes   Endo/Heme/Allergies: Negative.   Psychiatric/Behavioral:         See HPI    Individual Medical History/ Review of Systems: Changes? :Yes    see notes on chart  Past medications for mental health diagnoses include: Trazodone, Risperdal, Zoloft, Lunesta, prazosin, Sonata, Prozac, Depakote, Lamictal, lithium, Wellbutrin, Xanax, Ambien, carbamazepine, Seroquel, Buspar caused falls and hallucinations, Gabapentin-she doesn't want to take  Allergies: Avelox [moxifloxacin hcl in nacl], Bactrim [sulfamethoxazole-trimethoprim], Ciprofloxacin, Buspar [buspirone], Neomycin-polymyxin-dexameth, Aquaphor [lanolin-petrolatum], Depakote [divalproex sodium], Imitrex [sumatriptan], and Stadol [butorphanol]  Current Medications:  Current Outpatient Medications:    aspirin EC 81 MG tablet, Take 1 tablet (81 mg) by mouth once daily. Swallow whole., Disp: , Rfl:    buPROPion (WELLBUTRIN XL) 150 MG 24 hr tablet, Take 3 tablets (450 mg total) by mouth daily., Disp: 270 tablet, Rfl: 3   conjugated estrogens (PREMARIN) vaginal cream, Apply one pea-sized amount around the opening of the urethra daily for 2 weeks, then 3 times weekly moving forward., Disp: 30 g, Rfl: 4   furosemide (LASIX) 20 MG tablet, Take 1 tablet (20 mg total) by mouth daily. May take an additional tablet (20mg ) AS NEEDED for weight gain and/or swelling., Disp: 90 tablet, Rfl: 3   lamoTRIgine (LAMICTAL) 200 MG tablet, Take 2 tablets (400 mg total) by mouth 2 (two) times daily., Disp: 60 tablet, Rfl: 11   ondansetron (ZOFRAN-ODT) 8 MG disintegrating tablet, Take 1 tablet (8 mg total) by mouth every 8 (eight) hours as  needed for nausea or vomiting., Disp: 60 tablet, Rfl: 1   promethazine (PHENERGAN) 25 MG tablet, Take 1 tablet (25 mg total) by mouth every 8 (eight) hours as needed for nausea or vomiting., Disp: 20 tablet, Rfl: 0   QUEtiapine (SEROQUEL) 300 MG tablet, Take 1 tablet (300 mg total) by mouth at bedtime., Disp: 30 tablet, Rfl: 5    rOPINIRole (REQUIP) 1 MG tablet, Take 1 tablet (1 mg total) by mouth at bedtime., Disp: 90 tablet, Rfl: 1   rosuvastatin (CRESTOR) 5 MG tablet, Take 1 tablet (5 mg total) by mouth daily., Disp: 90 tablet, Rfl: 1   sertraline (ZOLOFT) 100 MG tablet, Take 2.5 tablets (250 mg total) by mouth daily., Disp: 75 tablet, Rfl: 0   vedolizumab (ENTYVIO) 300 MG injection, Inject into the vein. Every 4 weeks, Disp: , Rfl:    ALPRAZolam (XANAX) 0.5 MG tablet, Take 1.5 tablets (0.75 mg total) by mouth 2 (two) times daily., Disp: 30 tablet, Rfl: 0   tirzepatide (MOUNJARO) 5 MG/0.5ML Pen, INJECT 1 SYRINGE SUBCUTANEOUSLY ONCE A WEEK, Disp: 12 mL, Rfl: 1 Medication Side Effects: none  Family Medical/ Social History: Changes?  no  MENTAL HEALTH EXAM:  There were no vitals taken for this visit.There is no height or weight on file to calculate BMI.  General Appearance: Casual, Neat, and Well Groomed  Eye Contact:  Good  Speech:  Clear and Coherent and Normal Rate  Volume:  Normal  Mood:  Euthymic  Affect:  Congruent  Thought Process:  Goal Directed and Descriptions of Associations: Circumstantial  Orientation:  Full (Time, Place, and Person)  Thought Content: Logical   Suicidal Thoughts:  No  Homicidal Thoughts:  No  Memory:   stable   Judgement:  Good  Insight:  Good  Psychomotor Activity:  Normal  Concentration:  Concentration: Good and Attention Span: Good  Recall:  Good  Fund of Knowledge: Good  Language: Good  Assets:  Desire for Improvement Financial Resources/Insurance Housing Transportation Vocational/Educational  ADL's:  Intact  Cognition: WNL  Prognosis:  Good   DIAGNOSES:    ICD-10-CM   1. Generalized anxiety disorder  F41.1     2. Benzodiazepine dependence  F13.20     3. Bipolar I disorder  F31.9     4. Restless legs syndrome (RLS)  G25.81     5. Insomnia, unspecified type  G47.00      Receiving Psychotherapy: No   RECOMMENDATIONS:  PDMP reviewed.  Last Xanax was  filled 10/20/2022. I provided 30 minutes of face to face time during this encounter, including time spent before and after the visit in records review, medical decision making, counseling pertinent to today's visit, and charting.   Continue Xanax 0.5 mg, 1.5 pills twice a day (or 1 three times a day) . I am not changing the dose today, because of upcoming medical procedures and stress involved with those. Will recheck in 3 weeks and likely decrease the dose slightly then.   Continue Xanax 0.5 mg, 1.5 pills p.o. twice daily.      Continue Wellbutrin XL 150 mg, 3 p.o. every morning. Continue Lamictal 200 mg, 1 p.o. twice daily.  (Change from 2 pills nightly.) Continue Seroquel 300 mg, 1 p.o. nightly. Continue Requip 1 mg bid (PCP) Continue Zoloft 100 mg 2.5 pills daily. Continue Vit D 50,000 iu weekly. Return in 3 weeks.   Melony Overlyeresa Larrissa Stivers, PA-C

## 2022-11-08 NOTE — Patient Instructions (Signed)
Continue Xanax 0.5 mg, 1.5 pills twice a day (or 1 three times a day) . I am not changing the dose today, because of upcoming medical procedures and stress involved with those. Will recheck in 3 weeks and likely decrease the dose slightly then.

## 2022-11-09 NOTE — Telephone Encounter (Signed)
Requested medications are due for refill today.  yes  Requested medications are on the active medications list.  yes  Last refill. 06/01/2022 21mL 1rf  Future visit scheduled.   yes  Notes to clinic.  No protocol for this medication. Please review for refill.    Requested Prescriptions  Pending Prescriptions Disp Refills   MOUNJARO 5 MG/0.5ML Pen [Pharmacy Med Name: Mounjaro 5 MG/0.5ML Subcutaneous Solution Pen-injector] 12 mL 0    Sig: INJECT 1 SYRINGE SUBCUTANEOUSLY ONCE A WEEK     Off-Protocol Failed - 11/08/2022  2:10 PM      Failed - Medication not assigned to a protocol, review manually.      Passed - Valid encounter within last 12 months    Recent Outpatient Visits           1 week ago Benzodiazepine withdrawal without complication   [facility] [name], NP   1 month ago Benzodiazepine withdrawal without complication [facility])   [place] [facility] [name], NP   1 month ago Recurrent UTI   [place] [facility] [name], NP   1 month ago Acute cystitis without hematuria   [place] [facility] [name], NP   2 months ago Chronic heart failure with preserved ejection fraction [facility])   [place] [facility] [name], NP       Future Appointments             In 2 months [name], NP [place] [facility]

## 2022-11-12 ENCOUNTER — Telehealth: Payer: Self-pay | Admitting: Nurse Practitioner

## 2022-11-12 DIAGNOSIS — D509 Iron deficiency anemia, unspecified: Secondary | ICD-10-CM

## 2022-11-12 NOTE — Telephone Encounter (Signed)
Referral Request - Has patient seen PCP for this complaint? yes but hasn't seen blotches on skin *If NO, is insurance requiring patient see PCP for this issue before PCP can refer them? Referral for which specialty: specialist for blood disorder, diagnosis from specialist Preferred provider/office: cancer center at [facility] Reason for referral: anemia iron deficiency, blotches on skin

## 2022-11-13 ENCOUNTER — Ambulatory Visit
Admission: RE | Admit: 2022-11-13 | Discharge: 2022-11-13 | Disposition: A | Discharge status: Home / Self Care | Payer: BC Managed Care – PPO | Source: Ambulatory Visit | Attending: Physician Assistant | Admitting: Physician Assistant

## 2022-11-13 DIAGNOSIS — R310 Gross hematuria: Secondary | ICD-10-CM | POA: Insufficient documentation

## 2022-11-13 DIAGNOSIS — N200 Calculus of kidney: Secondary | ICD-10-CM | POA: Diagnosis not present

## 2022-11-13 MED ORDER — IOHEXOL 300 MG/ML  SOLN
125.0000 mL | Freq: Once | INTRAMUSCULAR | Status: AC | PRN
  Administered 2022-11-13: 100 mL via INTRAVENOUS

## 2022-11-13 NOTE — Telephone Encounter (Signed)
  Pt called back saying the nurse at the cancer center is waiting on the referral so they can get her in.

## 2022-11-14 ENCOUNTER — Ambulatory Visit: Payer: BC Managed Care – PPO | Admitting: Physician Assistant

## 2022-11-14 ENCOUNTER — Ambulatory Visit: Payer: BC Managed Care – PPO | Admitting: Nurse Practitioner

## 2022-11-14 NOTE — Telephone Encounter (Signed)
Referral placed for patient.  

## 2022-11-19 ENCOUNTER — Inpatient Hospital Stay: Payer: BC Managed Care – PPO

## 2022-11-19 ENCOUNTER — Ambulatory Visit: Payer: BC Managed Care – PPO | Admitting: Urology

## 2022-11-19 ENCOUNTER — Inpatient Hospital Stay: Payer: BC Managed Care – PPO | Admitting: Internal Medicine

## 2022-11-19 ENCOUNTER — Telehealth: Payer: Self-pay | Admitting: Physician Assistant

## 2022-11-19 VITALS — BP 100/67 | HR 105 | Ht 60.0 in | Wt 117.0 lb

## 2022-11-19 DIAGNOSIS — N390 Urinary tract infection, site not specified: Secondary | ICD-10-CM

## 2022-11-19 LAB — URINALYSIS, COMPLETE
Bilirubin, UA: NEGATIVE
Glucose, UA: NEGATIVE
Ketones, UA: NEGATIVE
Nitrite, UA: POSITIVE — AB
Protein,UA: NEGATIVE
Specific Gravity, UA: <1.005 — ABNORMAL LOW (ref 1.005–1.030)
Urobilinogen, Ur: 0.2 mg/dL (ref 0.2–1.0)
pH, UA: 5 (ref 5.0–7.5)

## 2022-11-19 LAB — MICROSCOPIC EXAMINATION

## 2022-11-19 MED ORDER — AMOXICILLIN-POT CLAVULANATE 875-125 MG PO TABS: 1.0000 | ORAL_TABLET | Freq: Two times a day (BID) | ORAL | 0 refills | Status: DC

## 2022-11-19 MED ORDER — CEPHALEXIN 250 MG PO CAPS: 250.0000 mg | ORAL_CAPSULE | Freq: Every day | ORAL | 11 refills | Status: DC

## 2022-11-19 NOTE — Telephone Encounter (Signed)
Error, encounter not needed; medication already sent

## 2022-11-19 NOTE — Progress Notes (Signed)
11/19/2022 3:09 PM   Amy Hansen 01-Sep-1955 [id number]  Referring provider: [name], NP [address] [place],  [place] [address]  Chief Complaint  Patient presents with   Cysto    HPI: Amy Hansen: Amy Hansen is a 67 y.o. female Caucasian with a history of hematuria, [name]   When seen in February by nurse practitioner she had cystitis symptoms.  It appears in 2016 she was evaluated with retrogrades and cystoscopy and a nonenhanced CT scan that was negative.  She was told to continue vaginal estrogen cream in the urine culture was positive on that same day of the visit.  It appears she has had 3+ urine cultures in the last 2 months   Today Patient describes back-to-back infections over the last 5 months.  When she is not infected she voids 3 times a night and every 2 hours during the day and stays continent.  She has had a previous sling.  .    Start patient on Macrodantin and reevaluate in 7 weeks.  She has significant allergies to a number of antibiotics.  I did not yet perform cystoscopy but she has had a previous sling   Today When I saw the patient in March the urine culture is positive.  She has had 2+ culture since.  Frequency stable   Family history a little bit difficult but she seems to have been doing very well in the last 8 or 10 weeks on daily Macrodantin.  In the last 3 days her urine is a bit foul-smelling with intermittent burning and we sent the urine for culture.  She said she is been doing well on it.  She never had the ultrasound.     Reviewed chart Patient has breakthrough infections on daily Macrobid.  Just finished antibiotic.  She is not certain she still infected.  Frequency stable.  Nocturia stable   I will send urine for culture.  Because of allergies I cannot use trimethoprim.  I will switch her to daily Keflex and see her in 3 months.  She  did not return to follow-up  Today I have not seen the patient for about 3 years.  He saw the nurse practitioner March 2028.  She came in with a history of gross hematuria.  She has had positive cultures.  She was having a lot of medical issues.  The hematuria was painless.  There is a question whether or not she is colonized with Klebsiella.  She had a CT scan which showed a 7 mm nonobstructing stone in the left kidney.  She had a positive culture with E. coli in the urine.  She was having some nonspecific back pain associated with it  Patient said the gross hematuria settled down.  She was having some burning at the time of the infection.  She says about every 3 months she gets burning and frequency that generally respond to antibiotics.  She is not convinced that her current infection has normalized.  Cystoscopy: Patient underwent flexible cystoscopy.  Urine was cloudy with white flecks.  There may have been minimal erythema of the bladder mucosa but within normal limits.  She was a bit tender with bladder filling.  No carcinoma  Urine aspirated and sent for culture  No stress incontinence or prolapse on pelvic examination  It appears the patient was given [facility] February 2024.  On  March 28 she had Ceftin 250 mg twice a day for 7 days and then back in February she also had doxycycline.  She was referred to infectious disease by someone but did not go.  Her last culture was sensitive to sulfa Macrodantin ciprofloxacin and Augmentin and ampicillin as well as doxycycline.  She is allergic to moxifloxacin Bactrim Cipro with anaphylaxis to the moxifloxacin.  The culture prior to this in February demonstrated 2 organisms with different sensitivity and resistance patterns    PMH: Past Medical History:  Diagnosis Date   Anemia    Anxiety    Arthritis    Blood transfusion without reported diagnosis    Cataract    CHF (congestive heart failure)    Chronic kidney disease    UTI, hematuria in  urine   Colitis    Crohn's disease    Depression    Diabetes    Diverticulosis    Frequent headaches    Interstitial cystitis    Recurrent UTI    Restless leg syndrome    TIA (transient ischemic attack)    more than year ago per pt 12-26-21   TIA (transient ischemic attack) 02/20/2021   Urinary frequency     Surgical History: Past Surgical History:  Procedure Laterality Date   bariatric bypass  2012   BIOPSY  05/03/2020   Procedure: BIOPSY;  Surgeon: Rachael Fee, MD;  Location: [facility] ENDOSCOPY;  Service: Endoscopy;;   CARPAL TUNNEL RELEASE Right 2003   CARPAL TUNNEL RELEASE Right    2008   CHOLECYSTECTOMY  1975   COLONOSCOPY     COLONOSCOPY WITH PROPOFOL N/A 05/03/2020   Procedure: COLONOSCOPY WITH PROPOFOL;  Surgeon: Rachael Fee, MD;  Location: [facility] ENDOSCOPY;  Service: Endoscopy;  Laterality: N/A;   CYSTOSCOPY W/ RETROGRADES Bilateral 06/06/2015   Procedure: CYSTOSCOPY WITH RETROGRADE PYELOGRAM;  Surgeon: Jerilee Field, MD;  Location: [facility] ORS;  Service: Urology;  Laterality: Bilateral;   EYE SURGERY Left 07/06/2022   FL INJ LEFT KNEE CT ARTHROGRAM ([facility] HX) Left    1995   GASTRIC BYPASS  2010   HAND SURGERY Left 01/19/2021   Thumb   HEMORRHOID SURGERY  2013   KNEE ARTHROSCOPY Left 1996   TONSILLECTOMY     TOTAL ABDOMINAL HYSTERECTOMY W/ BILATERAL SALPINGOOPHORECTOMY      Home Medications:  Allergies as of 11/19/2022       Reactions   Avelox [moxifloxacin Hcl In Nacl] Anaphylaxis   Bactrim [sulfamethoxazole-trimethoprim] Anaphylaxis   Ciprofloxacin Other (See Comments)   Pt states she was told never to take this as it is in the same family as Avelox.    Buspar [buspirone] Other (See Comments)   hallucinations   Neomycin-polymyxin-dexameth Other (See Comments)   Burning in the eyes   Aquaphor [lanolin-petrolatum] Itching, Rash   Depakote [divalproex Sodium] Other (See Comments)   Unknown Reaction   Imitrex [sumatriptan] Other (See Comments)   Neck and  shoulder pain   Stadol [butorphanol] Rash        Medication List        Accurate as of November 19, 2022  3:09 PM. If you have any questions, ask your nurse or doctor.          ALPRAZolam 0.5 MG tablet Commonly known as: Xanax Take 1.5 tablets (0.75 mg total) by mouth 2 (two) times daily.   aspirin EC 81 MG tablet Take 1 tablet (81 mg) by mouth once daily. Swallow whole.   buPROPion 150 MG 24 hr  tablet Commonly known as: WELLBUTRIN XL Take 3 tablets (450 mg total) by mouth daily.   furosemide 20 MG tablet Commonly known as: LASIX Take 1 tablet (20 mg total) by mouth daily. May take an additional tablet ( ) AS NEEDED for weight gain and/or swelling.   lamoTRIgine 200 MG tablet Commonly known as: LAMICTAL Take 2 tablets (400 mg total) by mouth 2 (two) times daily.   Mounjaro 5 MG/0.5ML Pen Generic drug: tirzepatide INJECT 1 SYRINGE SUBCUTANEOUSLY ONCE A WEEK   ondansetron 8 MG disintegrating tablet Commonly known as: ZOFRAN-ODT Take 1 tablet (8 mg total) by mouth every 8 (eight) hours as needed for nausea or vomiting.   pramipexole 1 MG tablet Commonly known as: MIRAPEX Take 1 mg by mouth at bedtime.   Premarin vaginal cream Generic drug: conjugated estrogens Apply one pea-sized amount around the opening of the urethra daily for 2 weeks, then 3 times weekly moving forward.   promethazine 25 MG tablet Commonly known as: PHENERGAN Take 1 tablet (25 mg total) by mouth every 8 (eight) hours as needed for nausea or vomiting.   QUEtiapine 300 MG tablet Commonly known as: SEROQUEL Take 1 tablet (300 mg total) by mouth at bedtime.   rOPINIRole 1 MG tablet Commonly known as: REQUIP Take 1 tablet (1 mg total) by mouth at bedtime.   rosuvastatin 5 MG tablet Commonly known as: Crestor Take 1 tablet (5 mg total) by mouth daily.   sertraline 100 MG tablet Commonly known as: ZOLOFT Take 2.5 tablets (250 mg total) by mouth daily.   vedolizumab 300 MG  injection Commonly known as: ENTYVIO Inject into the vein. Every 4 weeks        Allergies:  Allergies  Allergen Reactions   Avelox [Moxifloxacin Hcl In Nacl] Anaphylaxis   Bactrim [Sulfamethoxazole-Trimethoprim] Anaphylaxis   Ciprofloxacin Other (See Comments)    Pt states she was told never to take this as it is in the same family as Avelox.    Buspar [Buspirone] Other (See Comments)    hallucinations   Neomycin-Polymyxin-Dexameth Other (See Comments)    Burning in the eyes   Aquaphor [Lanolin-Petrolatum] Itching and Rash   Depakote [Divalproex Sodium] Other (See Comments)    Unknown Reaction   Imitrex [Sumatriptan] Other (See Comments)    Neck and shoulder pain   Stadol [Butorphanol] Rash    Family History: Family History  Problem Relation Age of Onset   Colon cancer Mother    Stroke Father    Heart failure Sister    Bladder Cancer Neg Hx    Kidney disease Neg Hx    Prostate cancer Neg Hx    Kidney cancer Neg Hx    Pancreatic cancer Neg Hx    Esophageal cancer Neg Hx    Stomach cancer Neg Hx    Rectal cancer Neg Hx    Breast cancer Neg Hx     Social History:  reports that she quit smoking about 47 years ago. Her smoking use included cigarettes. She smoked an average of .5 packs per day. She has never used smokeless tobacco. She reports that she does not drink alcohol and does not use drugs.  ROS:                                        Physical Exam: LMP  (LMP Unknown)   Constitutional:  Alert and oriented, No acute distress. HEENT:  Ona AT, moist mucus membranes.  Trachea midline, no masses.  Laboratory Data: Lab Results  Component Value Date   WBC 5.1 08/24/2022   HGB 12.7 08/24/2022   HCT 38.6 08/24/2022   MCV 85.7 08/24/2022   PLT 313.0 08/24/2022    Lab Results  Component Value Date   CREATININE 0.84 08/24/2022    No results found for: "PSA"  No results found for: "TESTOSTERONE"  Lab Results  Component Value Date    HGBA1C 6.1 (H) 08/02/2022    Urinalysis    Component Value Date/Time   COLORURINE YELLOW 02/08/2021 1253   APPEARANCEUR Cloudy (A) 10/25/2022 1437   LABSPEC 1.021 02/08/2021 1253   LABSPEC 1.003 01/09/2012 2126   PHURINE 6.0 02/08/2021 1253   GLUCOSEU Trace (A) 10/25/2022 1437   GLUCOSEU Negative 01/09/2012 2126   HGBUR NEGATIVE 02/08/2021 1253   BILIRUBINUR Negative 10/25/2022 1437   BILIRUBINUR Negative 01/09/2012 2126   KETONESUR 5 (A) 02/08/2021 1253   PROTEINUR 2+ (A) 10/25/2022 1437   PROTEINUR NEGATIVE 02/08/2021 1253   NITRITE Positive (A) 10/25/2022 1437   NITRITE POSITIVE (A) 02/08/2021 1253   LEUKOCYTESUR Trace (A) 10/25/2022 1437   LEUKOCYTESUR SMALL (A) 02/08/2021 1253   LEUKOCYTESUR Negative 01/09/2012 2126    Pertinent Imaging:   Assessment & Plan: Urine sent for culture.  Patient was given Augmentin 1 tablet twice a day for 7 days.  Patient will then add on daily Keflex.  Hopefully this helps decrease infections.  We might have to go back to Macrobid if needed.  She can take Macrodantin and Keflex.  She has not been on prophylaxis for many months  1. Recurrent UTI  - Urinalysis, Complete   No follow-ups on file.  Martina Sinner, MD  [facility] [address], [address] [place], [place] [address] [phone]

## 2022-11-19 NOTE — Telephone Encounter (Signed)
MyChart message sent to patient to go to the lab for LFTs

## 2022-11-19 NOTE — Telephone Encounter (Signed)
-----   Message from Cooper Render, CMA sent at 11/05/2022  3:51 PM EDT ----- Regarding: due for LFTs Remind patient to go for LFTs around 4-22

## 2022-11-20 ENCOUNTER — Telehealth: Payer: Self-pay | Admitting: Physician Assistant

## 2022-11-20 NOTE — Telephone Encounter (Signed)
Pt called and said that her stomach is nervous. Coming off the xanax was fine. She wants to know if you can prescribe something for her anxious tummy. P{lease give her a call at  [phone]

## 2022-11-21 NOTE — Telephone Encounter (Signed)
Patient said she ran out of medication and did not have it Saturday or Sunday. She said she thinks she will be okay now that she has her medication. She has an appt next week. Told her to let us know if sx continued or if other concerns.

## 2022-11-22 ENCOUNTER — Telehealth: Payer: Self-pay

## 2022-11-22 NOTE — Telephone Encounter (Signed)
New patient courtesy call made to patient. Informed of cancer center location, mask policy and visitor policy. All pt questions answered.    

## 2022-11-23 ENCOUNTER — Other Ambulatory Visit (INDEPENDENT_AMBULATORY_CARE_PROVIDER_SITE_OTHER): Payer: BC Managed Care – PPO

## 2022-11-23 ENCOUNTER — Telehealth: Payer: Self-pay

## 2022-11-23 ENCOUNTER — Inpatient Hospital Stay: Payer: BC Managed Care – PPO

## 2022-11-23 ENCOUNTER — Inpatient Hospital Stay: Payer: BC Managed Care – PPO | Attending: Internal Medicine | Admitting: Internal Medicine

## 2022-11-23 ENCOUNTER — Encounter: Payer: Self-pay | Admitting: Internal Medicine

## 2022-11-23 VITALS — BP 113/76 | HR 102 | Temp 97.8°F | Resp 20 | Ht 60.0 in | Wt 114.6 lb

## 2022-11-23 DIAGNOSIS — K523 Indeterminate colitis: Secondary | ICD-10-CM

## 2022-11-23 DIAGNOSIS — Z79899 Other long term (current) drug therapy: Secondary | ICD-10-CM | POA: Diagnosis not present

## 2022-11-23 DIAGNOSIS — N189 Chronic kidney disease, unspecified: Secondary | ICD-10-CM | POA: Insufficient documentation

## 2022-11-23 DIAGNOSIS — R7989 Other specified abnormal findings of blood chemistry: Secondary | ICD-10-CM

## 2022-11-23 DIAGNOSIS — Z800 Family history of malignant neoplasm of digestive organs: Secondary | ICD-10-CM | POA: Diagnosis not present

## 2022-11-23 DIAGNOSIS — Z87891 Personal history of nicotine dependence: Secondary | ICD-10-CM | POA: Diagnosis not present

## 2022-11-23 DIAGNOSIS — E1122 Type 2 diabetes mellitus with diabetic chronic kidney disease: Secondary | ICD-10-CM | POA: Diagnosis not present

## 2022-11-23 DIAGNOSIS — Z7982 Long term (current) use of aspirin: Secondary | ICD-10-CM | POA: Insufficient documentation

## 2022-11-23 DIAGNOSIS — Z9884 Bariatric surgery status: Secondary | ICD-10-CM | POA: Insufficient documentation

## 2022-11-23 DIAGNOSIS — K5090 Crohn's disease, unspecified, without complications: Secondary | ICD-10-CM | POA: Diagnosis not present

## 2022-11-23 DIAGNOSIS — D509 Iron deficiency anemia, unspecified: Secondary | ICD-10-CM | POA: Insufficient documentation

## 2022-11-23 DIAGNOSIS — Z8673 Personal history of transient ischemic attack (TIA), and cerebral infarction without residual deficits: Secondary | ICD-10-CM | POA: Insufficient documentation

## 2022-11-23 DIAGNOSIS — F419 Anxiety disorder, unspecified: Secondary | ICD-10-CM | POA: Diagnosis not present

## 2022-11-23 DIAGNOSIS — R112 Nausea with vomiting, unspecified: Secondary | ICD-10-CM

## 2022-11-23 LAB — CBC WITH DIFFERENTIAL/PLATELET
Basophils Absolute: 0 10*3/uL (ref 0.0–0.1)
Basophils Relative: 0.3 % (ref 0.0–3.0)
Eosinophils Absolute: 0 10*3/uL (ref 0.0–0.7)
Eosinophils Relative: 0.3 % (ref 0.0–5.0)
HCT: 36.2 % (ref 36.0–46.0)
Hemoglobin: 12 g/dL (ref 12.0–15.0)
Lymphocytes Relative: 23.9 % (ref 12.0–46.0)
Lymphs Abs: 1.3 10*3/uL (ref 0.7–4.0)
MCHC: 33 g/dL (ref 30.0–36.0)
MCV: 93 fl (ref 78.0–100.0)
Monocytes Absolute: 0.3 10*3/uL (ref 0.1–1.0)
Monocytes Relative: 6.2 % (ref 3.0–12.0)
Neutro Abs: 3.6 10*3/uL (ref 1.4–7.7)
Neutrophils Relative %: 69.3 % (ref 43.0–77.0)
Platelets: 344 10*3/uL (ref 150.0–400.0)
RBC: 3.89 Mil/uL (ref 3.87–5.11)
RDW: 13.8 % (ref 11.5–15.5)
WBC: 5.2 10*3/uL (ref 4.0–10.5)

## 2022-11-23 LAB — HEPATIC FUNCTION PANEL
ALT: 17 U/L (ref 0–35)
AST: 26 U/L (ref 0–37)
Albumin: 3.2 g/dL — ABNORMAL LOW (ref 3.5–5.2)
Alkaline Phosphatase: 139 U/L — ABNORMAL HIGH (ref 39–117)
Bilirubin, Direct: 0.1 mg/dL (ref 0.0–0.3)
Total Bilirubin: 0.2 mg/dL (ref 0.2–1.2)
Total Protein: 6.2 g/dL (ref 6.0–8.3)

## 2022-11-23 LAB — CULTURE, URINE COMPREHENSIVE

## 2022-11-23 LAB — IBC + FERRITIN
Ferritin: 89.8 ng/mL (ref 10.0–291.0)
Iron: 54 ug/dL (ref 42–145)
Saturation Ratios: 22 % (ref 20.0–50.0)
TIBC: 245 ug/dL — ABNORMAL LOW (ref 250.0–450.0)
Transferrin: 175 mg/dL — ABNORMAL LOW (ref 212.0–360.0)
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
## 2022-11-23 NOTE — Telephone Encounter (Signed)
See other phone message  

## 2022-11-23 NOTE — Telephone Encounter (Signed)
Prior Authorization initiated with [employer] for Sertraline 100 mg 2.5 tablets daily #75/30 day, pending response.  Takes for MDD, OCD, and GAD

## 2022-11-24 LAB — CULTURE, URINE COMPREHENSIVE

## 2022-11-26 ENCOUNTER — Other Ambulatory Visit: Payer: Self-pay

## 2022-11-26 ENCOUNTER — Telehealth: Payer: Self-pay

## 2022-11-26 DIAGNOSIS — D509 Iron deficiency anemia, unspecified: Secondary | ICD-10-CM

## 2022-11-26 DIAGNOSIS — R7989 Other specified abnormal findings of blood chemistry: Secondary | ICD-10-CM

## 2022-11-26 DIAGNOSIS — K50119 Crohn's disease of large intestine with unspecified complications: Secondary | ICD-10-CM

## 2022-11-26 MED ORDER — DOXYCYCLINE HYCLATE 100 MG PO CAPS: 100.0000 mg | ORAL_CAPSULE | Freq: Two times a day (BID) | ORAL | 0 refills | Status: DC

## 2022-11-26 NOTE — Telephone Encounter (Signed)
Prior Approval received for Sertraline 100 mg #75 for 30 day with [employer], effective through 11/23/2023

## 2022-11-26 NOTE — Telephone Encounter (Signed)
Patient advised in regards to her results.  Patient states since 11/23/22 she has noticed a large amount of blood in her urine every time she has urinated. It is a darker color but can still tell it is blood. No blood clots. She has chronic off and on nausea, dizziness, and right lower back pain-she states this is not new for her. NO fever. NO vomiting or nausea. NO lower abdominal pain. She saw her CT scan showed kidney stone and wonders if blood coming from that.  New antibiotic sent in.  She had Cysto with Dr Sherron Monday on 11/19/22

## 2022-11-26 NOTE — Telephone Encounter (Signed)
-----   Message from Alfredo Martinez, MD sent at 11/26/2022  9:34 AM EDT ----- Stop the Augmentin Take doxycycline 100 mg twice a day for 7 days Then start daily Keflex as ordered ----- Message ----- From: Consuella Lose, CMA Sent: 11/26/2022   8:14 AM EDT To: Alfredo Martinez, MD   ----- Message ----- From: Interface, [facility] Lab Results In Sent: 11/19/2022   4:36 PM EDT To: Jennette Kettle Clinical

## 2022-11-28 ENCOUNTER — Encounter: Payer: Self-pay | Admitting: Physician Assistant

## 2022-11-28 ENCOUNTER — Ambulatory Visit: Payer: BC Managed Care – PPO | Admitting: Physician Assistant

## 2022-11-28 DIAGNOSIS — F411 Generalized anxiety disorder: Secondary | ICD-10-CM | POA: Diagnosis not present

## 2022-11-28 DIAGNOSIS — F319 Bipolar disorder, unspecified: Secondary | ICD-10-CM | POA: Diagnosis not present

## 2022-11-28 DIAGNOSIS — F1320 Sedative, hypnotic or anxiolytic dependence, uncomplicated: Secondary | ICD-10-CM

## 2022-11-28 DIAGNOSIS — G2581 Restless legs syndrome: Secondary | ICD-10-CM | POA: Diagnosis not present

## 2022-11-28 DIAGNOSIS — G4700 Insomnia, unspecified: Secondary | ICD-10-CM

## 2022-11-28 MED ORDER — ALPRAZOLAM 0.5 MG PO TABS: ORAL_TABLET | ORAL | 0 refills | Status: DC

## 2022-11-28 NOTE — Progress Notes (Signed)
[facility]  Patient ID: Amy Hansen,  MRN: [id number]  PCP: [name], NP  Date of Evaluation: 11/28/2022  Time spent:20 minutes  Chief Complaint:  Chief Complaint   Anxiety; Follow-up    HISTORY/CURRENT STATUS: For 3 week med check. Accomp by her husband.   Has been on current dose of Xanax for approx 1 month. Is anxious about decreasing dose more. That's the thing that's causing anxiety now. "I'm afraid of what it'll do to me." Has had 1 PA since our LOV.  Husband is still concerned about the speed of weaning the Xanax, and asks about the previous max dose of Xanax.  Energy and motivation are fair to good depending on the day.  Work is ok.   No extreme sadness, tearfulness, or feelings of hopelessness.  Sleeps well most of the time but RLS can be very aggravating. ADLs and personal hygiene are normal.   Memory is still bad, but no worse.   Appetite has not changed.  Weight is stable.  Denies suicidal or homicidal thoughts.  Patient denies increased energy with decreased need for sleep, increased talkativeness, racing thoughts, impulsivity or risky behaviors, increased spending, increased libido, grandiosity, increased irritability or anger, paranoia, or hallucinations.  Denies dizziness, syncope, seizures, numbness, tingling, tremor, tics, unsteady gait, slurred speech, confusion. Denies muscle or joint pain. RLS sx are worse sometimes. Denies unexplained weight loss, frequent infections, or sores that heal slowly.  No polyphagia, polydipsia, or polyuria. Denies visual changes or paresthesias.   Individual Medical History/ Review of Systems: Changes? :Yes    reviewed note from Dr. Alena Bills, oncology on 11/23/2022.   Past medications for mental health diagnoses include: Trazodone, Risperdal, Zoloft, Lunesta, prazosin, Sonata, Prozac, Depakote, Lamictal, lithium, Wellbutrin, Xanax, Ambien, carbamazepine, Seroquel, Buspar caused falls and hallucinations,  Gabapentin-she doesn't want to take  Allergies: Avelox [moxifloxacin hcl in nacl], Bactrim [sulfamethoxazole-trimethoprim], Ciprofloxacin, Buspar [buspirone], Neomycin-polymyxin-dexameth, Aquaphor [lanolin-petrolatum], Depakote [divalproex sodium], Imitrex [sumatriptan], and Stadol [butorphanol]  Current Medications:  Current Outpatient Medications:    aspirin EC 81 MG tablet, Take 1 tablet (81 mg) by mouth once daily. Swallow whole., Disp: , Rfl:    buPROPion (WELLBUTRIN XL) 150 MG 24 hr tablet, Take 3 tablets (450 mg total) by mouth daily., Disp: 270 tablet, Rfl: 3   conjugated estrogens (PREMARIN) vaginal cream, Apply one pea-sized amount around the opening of the urethra daily for 2 weeks, then 3 times weekly moving forward., Disp: 30 g, Rfl: 4   doxycycline (VIBRAMYCIN) 100 MG capsule, Take 1 capsule (100 mg total) by mouth 2 (two) times daily., Disp: 14 capsule, Rfl: 0   furosemide (LASIX) 20 MG tablet, Take 1 tablet (20 mg total) by mouth daily. May take an additional tablet (20mg ) AS NEEDED for weight gain and/or swelling., Disp: 90 tablet, Rfl: 3   lamoTRIgine (LAMICTAL) 200 MG tablet, Take 2 tablets (400 mg total) by mouth 2 (two) times daily., Disp: 60 tablet, Rfl: 11   ondansetron (ZOFRAN-ODT) 8 MG disintegrating tablet, Take 1 tablet (8 mg total) by mouth every 8 (eight) hours as needed for nausea or vomiting., Disp: 60 tablet, Rfl: 1   pramipexole (MIRAPEX) 1 MG tablet, Take 1 mg by mouth at bedtime., Disp: , Rfl:    promethazine (PHENERGAN) 25 MG tablet, Take 1 tablet (25 mg total) by mouth every 8 (eight) hours as needed for nausea or vomiting., Disp: 20 tablet, Rfl: 0   QUEtiapine (SEROQUEL) 300 MG tablet, Take 1 tablet (300 mg total) by mouth at  bedtime., Disp: 30 tablet, Rfl: 5   rOPINIRole (REQUIP) 1 MG tablet, Take 1 tablet (1 mg total) by mouth at bedtime., Disp: 90 tablet, Rfl: 1   sertraline (ZOLOFT) 100 MG tablet, Take 2.5 tablets (250 mg total) by mouth daily., Disp: 75  tablet, Rfl: 0   tirzepatide (MOUNJARO) 5 MG/0.5ML Pen, INJECT 1 SYRINGE SUBCUTANEOUSLY ONCE A WEEK, Disp: 12 mL, Rfl: 1   vedolizumab (ENTYVIO) 300 MG injection, Inject into the vein. Every 4 weeks, Disp: , Rfl:    ALPRAZolam (XANAX) 0.5 MG tablet, 1 po q am, and 1.5 po qhs, Disp: 75 tablet, Rfl: 0   rosuvastatin (CRESTOR) 5 MG tablet, Take 1 tablet by mouth once daily, Disp: 90 tablet, Rfl: 1 Medication Side Effects: none  Family Medical/ Social History: Changes?  no  MENTAL HEALTH EXAM:  There were no vitals taken for this visit.There is no height or weight on file to calculate BMI.  General Appearance: Casual, Neat, and Well Groomed  Eye Contact:  Good  Speech:  Clear and Coherent and Normal Rate  Volume:  Normal  Mood:  Anxious  Affect:  Anxious  Thought Process:  Goal Directed and Descriptions of Associations: Circumstantial  Orientation:  Full (Time, Place, and Person)  Thought Content: Logical   Suicidal Thoughts:  No  Homicidal Thoughts:  No  Memory:   stable   Judgement:  Good  Insight:  Good  Psychomotor Activity:  Normal  Concentration:  Concentration: Good and Attention Span: Good  Recall:  Good  Fund of Knowledge: Good  Language: Good  Assets:  Desire for Improvement Financial Resources/Insurance Housing Transportation Vocational/Educational  ADL's:  Intact  Cognition: WNL  Prognosis:  Good   DIAGNOSES:    ICD-10-CM   1. 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 Generalized anxiety disorder  F41.1     3. Bipolar I disorder (HCC)  F31.9     4. Restless legs syndrome (RLS)  G25.81     5. Insomnia, unspecified type  G47.00      Receiving Psychotherapy: No   RECOMMENDATIONS:  PDMP reviewed.  Last Xanax was filled 11/19/2022. I provided 20 minutes of face to face time during this encounter, including time spent before and after the visit in records review, medical decision making, counseling pertinent to today's visit, and charting.   Discussed  decreasing the Xanax again. It's been approx a month since it was decreased before. Her husband is concerned about the taper, that it may be too fast, we've discussed already and I have increased intervals between decreased doses. He is dispensing the pills, enough for 1 day at a time, will cont that, as she has admitted in the past that she takes more than is prescribed. That has made it difficult to know how much/how often to decrease the dose. He states he didn't know she was taking more than prescribed. She admitted to him that she had been for 'a long time,' and there would be lots of times she would take the month's supply in 3-3 /12 weeks and not have any for the rest of the month. All the more reason for him to give her a day's supply at a time. He has the bottle locked in a safe place.   She asks if there's a medication we'll put in place of Xanax. No BZ, we'll discuss further if needed. I stressed importance of no caffeine or no more than 1 reg cup of coffee or soft drink per  day. Stressed importance of counseling. She's hesitant b/c she doesn't want to talk to someone about lots of things that happened in her life. Reminded her that a pill can't solve her problems, which it sounds like she wants to have happen. She agrees it is.   Decrease Xanax 0.5 mg, to 1 (0.5 mg) po q am, 1.5 (0.75 mg) po at night. (Or vice verse)  Continue Wellbutrin XL 150 mg, 3 p.o. every morning. Continue Lamictal 200 mg, 1 p.o. twice daily.  (Change from 2 pills nightly.) Continue Seroquel 300 mg, 1 p.o. nightly. Continue Pramipexole 1 mg qhs by PCP.  Continue Requip 1 mg bid (PCP) Continue Zoloft 100 mg 2.5 pills daily. They're aware this is 50 mg over the usual max dose, however in Dr. Maryland Pink prescribers guide, which is the leading publication of psych medications, some pts may need to increase to 400 mg.  MUST get psychotherapy. She'll contact her insurance to get a list of counselors that they cover, then  call for an appt.  Return in 4 weeks.   Melony Overly, PA-C

## 2022-11-29 ENCOUNTER — Ambulatory Visit: Payer: BC Managed Care – PPO

## 2022-11-30 ENCOUNTER — Other Ambulatory Visit: Payer: Self-pay | Admitting: Nurse Practitioner

## 2022-11-30 ENCOUNTER — Ambulatory Visit: Payer: BC Managed Care – PPO | Admitting: Gastroenterology

## 2022-11-30 NOTE — Telephone Encounter (Signed)
Requested Prescriptions  Pending Prescriptions Disp Refills   rosuvastatin (CRESTOR) 5 MG tablet [Pharmacy Med Name: Rosuvastatin Calcium 5 MG Oral Tablet] 90 tablet 0    Sig: Take 1 tablet by mouth once daily     Cardiovascular:  Antilipid - Statins 2 Failed - 11/30/2022  9:28 AM      Failed - Lipid Panel in normal range within the last 12 months    Cholesterol, Total  Date Value Ref Range Status  05/01/2022 145 100 - 199 mg/dL Final   Cholesterol  Date Value Ref Range Status  01/10/2012 148 0 - 200 mg/dL Final   Cholesterol Piccolo, Waived  Date Value Ref Range Status  07/17/2016 139 <200 mg/dL Final    Comment:                            Desirable                <200                         Borderline High      200- 239                         High                     >239    Ldl Cholesterol, Calc  Date Value Ref Range Status  01/10/2012 79 0 - 100 mg/dL Final   LDL Chol Calc (NIH)  Date Value Ref Range Status  05/01/2022 69 0 - 99 mg/dL Final   HDL Cholesterol  Date Value Ref Range Status  01/10/2012 47 40 - 60 mg/dL Final   HDL  Date Value Ref Range Status  05/01/2022 49 >39 mg/dL Final   Triglycerides  Date Value Ref Range Status  05/01/2022 159 (H) 0 - 149 mg/dL Final  01/60/1093 235 0 - 200 mg/dL Final   Triglycerides Piccolo,Waived  Date Value Ref Range Status  07/17/2016 99 <150 mg/dL Final    Comment:                            Normal                   <150                         Borderline High     150 - 199                         High                200 - 499                         Very High                >499          Passed - Cr in normal range and within 360 days    Creatinine  Date Value Ref Range Status  01/10/2012 0.58 (L) 0.60 - 1.30 mg/dL Final   Creatinine, Ser  Date Value Ref Range Status  08/24/2022 0.84 0.57 - 1.00 mg/dL Final         Passed -  Patient is not pregnant      Passed - Valid encounter within last 12 months     Recent Outpatient Visits           1 month ago Benzodiazepine withdrawal without complication [facility])   [place] [facility] [name], NP   1 month ago Benzodiazepine withdrawal without complication [facility])   [place] [facility] [name], NP   2 months ago Recurrent UTI   [place] [facility] [name], NP   2 months ago Acute cystitis without hematuria   [place] [facility] [name], NP   3 months ago Chronic heart failure with preserved ejection fraction [facility])   [place] [facility] [name], NP       Future Appointments             In 1 month [name], NP  [facility]   In 1 month MacDiarmid, Lorin Picket, MD [facility]

## 2022-12-05 ENCOUNTER — Ambulatory Visit (INDEPENDENT_AMBULATORY_CARE_PROVIDER_SITE_OTHER): Payer: BC Managed Care – PPO

## 2022-12-05 VITALS — BP 126/81 | HR 95 | Temp 97.9°F | Resp 16 | Ht 60.0 in | Wt 113.8 lb

## 2022-12-05 DIAGNOSIS — K5100 Ulcerative (chronic) pancolitis without complications: Secondary | ICD-10-CM | POA: Diagnosis not present

## 2022-12-05 DIAGNOSIS — R410 Disorientation, unspecified: Secondary | ICD-10-CM | POA: Diagnosis not present

## 2022-12-05 DIAGNOSIS — R296 Repeated falls: Secondary | ICD-10-CM | POA: Diagnosis not present

## 2022-12-05 DIAGNOSIS — G250 Essential tremor: Secondary | ICD-10-CM | POA: Diagnosis not present

## 2022-12-05 DIAGNOSIS — R519 Headache, unspecified: Secondary | ICD-10-CM | POA: Diagnosis not present

## 2022-12-05 DIAGNOSIS — R413 Other amnesia: Secondary | ICD-10-CM | POA: Diagnosis not present

## 2022-12-05 MED ORDER — VEDOLIZUMAB 300 MG IV SOLR
300.0000 mg | Freq: Once | INTRAVENOUS | Status: AC
  Administered 2022-12-05: 300 mg via INTRAVENOUS
  Filled 2022-12-05: qty 5

## 2022-12-05 NOTE — Progress Notes (Signed)
Diagnosis: Pancolitis  Provider:  Chilton Greathouse MD  Procedure: IV Infusion  IV Type: Peripheral, IV Location: L Antecubital  Entyvio (Vedolizumab), Dose: 300 mg  Infusion Start Time: 1136  Infusion Stop Time: 1209  Post Infusion IV Care: Peripheral IV Discontinued  Discharge: Condition: Good, Destination: Home . AVS Provided  Performed by:  Garnette Czech, RN

## 2022-12-14 ENCOUNTER — Encounter: Payer: Self-pay | Admitting: Physician Assistant

## 2022-12-14 ENCOUNTER — Ambulatory Visit
Admission: RE | Admit: 2022-12-14 | Discharge: 2022-12-14 | Disposition: A | Discharge status: Home / Self Care | Payer: BC Managed Care – PPO | Source: Ambulatory Visit | Attending: Physician Assistant | Admitting: Physician Assistant

## 2022-12-14 ENCOUNTER — Other Ambulatory Visit: Payer: Self-pay | Admitting: Physician Assistant

## 2022-12-14 DIAGNOSIS — I63233 Cerebral infarction due to unspecified occlusion or stenosis of bilateral carotid arteries: Secondary | ICD-10-CM | POA: Diagnosis not present

## 2022-12-14 DIAGNOSIS — G459 Transient cerebral ischemic attack, unspecified: Secondary | ICD-10-CM | POA: Diagnosis not present

## 2022-12-14 DIAGNOSIS — I639 Cerebral infarction, unspecified: Secondary | ICD-10-CM | POA: Diagnosis not present

## 2022-12-14 DIAGNOSIS — R519 Headache, unspecified: Secondary | ICD-10-CM | POA: Diagnosis not present

## 2022-12-14 DIAGNOSIS — R413 Other amnesia: Secondary | ICD-10-CM | POA: Diagnosis not present

## 2022-12-14 MED ORDER — IOHEXOL 350 MG/ML SOLN
75.0000 mL | Freq: Once | INTRAVENOUS | Status: AC | PRN
  Administered 2022-12-14: 75 mL via INTRAVENOUS

## 2022-12-19 NOTE — Telephone Encounter (Signed)
-----   Message from Cooper Render, CMA sent at 11/26/2022  3:00 PM EDT ----- Regarding: labs Due for LFTs around 5-29

## 2022-12-19 NOTE — Telephone Encounter (Signed)
MyChart message to patient to go to the lab 

## 2022-12-21 ENCOUNTER — Emergency Department (HOSPITAL_COMMUNITY)
Admission: EM | Admit: 2022-12-21 | Discharge: 2022-12-21 | Disposition: A | Discharge status: Home / Self Care | Payer: Worker's Compensation | Attending: Emergency Medicine | Admitting: Emergency Medicine

## 2022-12-21 ENCOUNTER — Encounter: Payer: Self-pay | Admitting: Gastroenterology

## 2022-12-21 ENCOUNTER — Encounter: Payer: Self-pay | Admitting: Oncology

## 2022-12-21 ENCOUNTER — Emergency Department (HOSPITAL_COMMUNITY): Payer: Worker's Compensation

## 2022-12-21 DIAGNOSIS — Z7982 Long term (current) use of aspirin: Secondary | ICD-10-CM | POA: Insufficient documentation

## 2022-12-21 DIAGNOSIS — I509 Heart failure, unspecified: Secondary | ICD-10-CM | POA: Insufficient documentation

## 2022-12-21 DIAGNOSIS — S42212A Unspecified displaced fracture of surgical neck of left humerus, initial encounter for closed fracture: Secondary | ICD-10-CM | POA: Insufficient documentation

## 2022-12-21 DIAGNOSIS — W010XXA Fall on same level from slipping, tripping and stumbling without subsequent striking against object, initial encounter: Secondary | ICD-10-CM | POA: Insufficient documentation

## 2022-12-21 DIAGNOSIS — Y93E3 Activity, vacuuming: Secondary | ICD-10-CM | POA: Diagnosis not present

## 2022-12-21 DIAGNOSIS — E119 Type 2 diabetes mellitus without complications: Secondary | ICD-10-CM | POA: Insufficient documentation

## 2022-12-21 DIAGNOSIS — Z794 Long term (current) use of insulin: Secondary | ICD-10-CM | POA: Insufficient documentation

## 2022-12-21 DIAGNOSIS — W19XXXA Unspecified fall, initial encounter: Secondary | ICD-10-CM

## 2022-12-21 DIAGNOSIS — S0990XA Unspecified injury of head, initial encounter: Secondary | ICD-10-CM | POA: Diagnosis not present

## 2022-12-21 DIAGNOSIS — I10 Essential (primary) hypertension: Secondary | ICD-10-CM | POA: Diagnosis not present

## 2022-12-21 DIAGNOSIS — M7989 Other specified soft tissue disorders: Secondary | ICD-10-CM | POA: Diagnosis not present

## 2022-12-21 DIAGNOSIS — S4992XA Unspecified injury of left shoulder and upper arm, initial encounter: Secondary | ICD-10-CM | POA: Diagnosis not present

## 2022-12-21 MED ORDER — SENNOSIDES-DOCUSATE SODIUM 8.6-50 MG PO TABS: 1.0000 | ORAL_TABLET | Freq: Every day | ORAL | 0 refills | Status: DC

## 2022-12-21 MED ORDER — OXYCODONE HCL 5 MG PO TABS: 5.0000 mg | ORAL_TABLET | Freq: Four times a day (QID) | ORAL | 0 refills | Status: DC | PRN

## 2022-12-21 MED ORDER — KETOROLAC TROMETHAMINE 15 MG/ML IJ SOLN
15.0000 mg | Freq: Once | INTRAMUSCULAR | Status: AC
  Administered 2022-12-21: 15 mg via INTRAVENOUS
  Filled 2022-12-21: qty 1

## 2022-12-21 MED ORDER — MORPHINE SULFATE (PF) 4 MG/ML IV SOLN
4.0000 mg | Freq: Once | INTRAVENOUS | Status: AC
  Administered 2022-12-21: 4 mg via INTRAVENOUS
  Filled 2022-12-21: qty 1

## 2022-12-21 MED ORDER — OXYCODONE-ACETAMINOPHEN 5-325 MG PO TABS
1.0000 | ORAL_TABLET | Freq: Once | ORAL | Status: AC
  Administered 2022-12-21: 1 via ORAL
  Filled 2022-12-21: qty 1

## 2022-12-21 NOTE — ED Provider Notes (Signed)
[place] EMERGENCY DEPARTMENT AT [facility] Provider Note   CSN: [id number] Arrival date & time: 12/21/22  1621     History  Chief Complaint  Patient presents with   Fall   Arm Injury    Amy Hansen is a 67 y.o. female.  Patient is a 67 year old female with a past medical history of diabetes, CHF, Crohn's, TIA presenting to the emergency department with left shoulder pain after a fall.  The patient states that she was vacuuming this afternoon when she lost her balance and fell back and tried to catch herself with her left arm.  She denies hitting her head or losing consciousness.  She states that she immediately felt pain in the left arm.  She denies any other pain or injuries from the fall.  She denies any numbness or weakness.  She states she is on aspirin and Plavix but denies any other blood thinner use.  The history is provided by the patient and the spouse.  Fall  Arm Injury      Home Medications Prior to Admission medications   Medication Sig Start Date End Date Taking? Authorizing Provider  oxyCODONE (ROXICODONE) 5 MG immediate release tablet Take 1 tablet (5 mg total) by mouth every 6 (six) hours as needed for severe pain. 12/21/22  Yes Theresia Lo, Turkey K, DO  senna-docusate (SENOKOT-S) 8.6-50 MG tablet Take 1 tablet by mouth daily. 12/21/22  Yes Theresia Lo, Benetta Spar K, DO  ALPRAZolam Prudy Feeler) 0.5 MG tablet 1 po q am, and 1.5 po qhs 11/28/22   Hurst, Glade Nurse, PA-C  aspirin EC 81 MG tablet Take 1 tablet (81 mg) by mouth once daily. Swallow whole.    [provider]  buPROPion (WELLBUTRIN XL) 150 MG 24 hr tablet Take 3 tablets (450 mg total) by mouth daily. 11/30/21   Melony Overly T, PA-C  conjugated estrogens (PREMARIN) vaginal cream Apply one pea-sized amount around the opening of the urethra daily for 2 weeks, then 3 times weekly moving forward. 09/19/22   Vaillancourt, Lelon Mast, PA-C  doxycycline (VIBRAMYCIN) 100 MG capsule Take 1 capsule (100 mg  total) by mouth 2 (two) times daily. 11/26/22   Alfredo Martinez, MD  furosemide (LASIX) 20 MG tablet Take 1 tablet (20 mg total) by mouth daily. May take an additional tablet (20mg ) AS NEEDED for weight gain and/or swelling. 08/13/22 08/08/23  Sondra Barges, PA-C  lamoTRIgine (LAMICTAL) 200 MG tablet Take 2 tablets (400 mg total) by mouth 2 (two) times daily. 10/18/22   Melony Overly T, PA-C  ondansetron (ZOFRAN-ODT) 8 MG disintegrating tablet Take 1 tablet (8 mg total) by mouth every 8 (eight) hours as needed for nausea or vomiting. 10/30/22   [name], NP  pramipexole (MIRAPEX) 1 MG tablet Take 1 mg by mouth at bedtime. 11/03/22   [provider]  promethazine (PHENERGAN) 25 MG tablet Take 1 tablet (25 mg total) by mouth every 8 (eight) hours as needed for nausea or vomiting. 07/04/22   Armbruster, Willaim Rayas, MD  QUEtiapine (SEROQUEL) 300 MG tablet Take 1 tablet (300 mg total) by mouth at bedtime. 10/18/22   Melony Overly T, PA-C  rOPINIRole (REQUIP) 1 MG tablet Take 1 tablet (1 mg total) by mouth at bedtime. 11/05/22   [name], NP  rosuvastatin (CRESTOR) 5 MG tablet Take 1 tablet by mouth once daily 11/30/22   [name], NP  sertraline (ZOLOFT) 100 MG tablet Take 2.5 tablets (250 mg total) by mouth daily. 10/22/22   Hurst,  Glade Nurse, PA-C  tirzepatide [facility]) 5 MG/0.5ML Pen INJECT 1 SYRINGE SUBCUTANEOUSLY ONCE A WEEK 11/09/22   [name], NP  vedolizumab (ENTYVIO) 300 MG injection Inject into the vein. Every 4 weeks    [provider]      Allergies    Avelox [moxifloxacin hcl in nacl], Bactrim [sulfamethoxazole-trimethoprim], Ciprofloxacin, Buspar [buspirone], Neomycin-polymyxin-dexameth, Aquaphor [lanolin-petrolatum], Depakote [divalproex sodium], Imitrex [sumatriptan], and Stadol [butorphanol]    Review of Systems   Review of Systems  Physical Exam Updated Vital Signs BP (!) 122/92 (BP Location: Right Arm)   Pulse 72   Temp 98.7 F (37.1 C) (Oral)    Resp 16   Ht 5' (1.524 m)   Wt 50.8 kg   LMP  (LMP Unknown)   SpO2 99%   BMI 21.87 kg/m  Physical Exam Vitals and nursing note reviewed.  Constitutional:      Appearance: She is not toxic-appearing.     Comments: Uncomfortable appearing  HENT:     Head: Normocephalic and atraumatic.     Nose: Nose normal.     Mouth/Throat:     Mouth: Mucous membranes are moist.     Pharynx: Oropharynx is clear.  Eyes:     Extraocular Movements: Extraocular movements intact.     Conjunctiva/sclera: Conjunctivae normal.  Neck:     Comments: No midline neck tenderness Cardiovascular:     Rate and Rhythm: Normal rate.     Pulses: Normal pulses.  Pulmonary:     Effort: Pulmonary effort is normal.  Abdominal:     General: Abdomen is flat.     Palpations: Abdomen is soft.     Tenderness: There is no abdominal tenderness.  Musculoskeletal:     Cervical back: Normal range of motion and neck supple.     Comments: Tenderness to palpation of left shoulder with overlying swelling and decreased ROM secondary to pain, no tenderness to left elbow or wrist No bony tenderness to right upper extremity or bilateral lower extremities Pelvis stable, nontender  Skin:    General: Skin is warm and dry.  Neurological:     General: No focal deficit present.     Mental Status: She is alert and oriented to person, place, and time.     Sensory: No sensory deficit.     Motor: No weakness.  Psychiatric:        Mood and Affect: Mood normal.        Behavior: Behavior normal.     ED Results / Procedures / Treatments   Labs (all labs ordered are listed, but only abnormal results are displayed) Labs Reviewed - No data to display  EKG None  Radiology DG Shoulder Left  Result Date: 12/21/2022 CLINICAL DATA:  Fall EXAM: LEFT SHOULDER - 2+ VIEW COMPARISON:  Humerus series today FINDINGS: There is an oblique left humeral neck fracture. Mild lateral displacement of the humeral shaft relative to the head. No  subluxation or dislocation. AC joint is intact. Soft tissues unremarkable. IMPRESSION: Mildly displaced left humeral neck fracture. Electronically Signed   By: Charlett Nose M.D.   On: 12/21/2022 17:08   DG Humerus Left  Result Date: 12/21/2022 CLINICAL DATA:  Fall EXAM: LEFT HUMERUS - 2+ VIEW COMPARISON:  None Available. FINDINGS: There is an oblique humeral neck fracture noted. Mild lateral displacement of the humeral shaft relative to the humeral head. No subluxation or dislocation. AC joint is intact. IMPRESSION: Mildly displaced left humeral neck fracture. Electronically Signed   By: Charlett Nose  M.D.   On: 12/21/2022 17:07    Procedures Procedures    Medications Ordered in ED Medications  morphine (PF) 4 MG/ML injection 4 mg (4 mg Intravenous Given 12/21/22 1720)  oxyCODONE-acetaminophen (PERCOCET/ROXICET) 5-325 MG per tablet 1 tablet (1 tablet Oral Given 12/21/22 1719)  ketorolac (TORADOL) 15 MG/ML injection 15 mg (15 mg Intravenous Given 12/21/22 1720)    ED Course/ Medical Decision Making/ A&P                             Medical Decision Making This patient presents to the ED with chief complaint(s) of shoulder pain, fall with pertinent past medical history of CHF, DM, Crohn's, TIA which further complicates the presenting complaint. The complaint involves an extensive differential diagnosis and also carries with it a high risk of complications and morbidity.    The differential diagnosis includes shoulder fracture, dislocation, contusion, muscle sprain, no other traumatic injuries seen on exam, she is neurovascularly intact making neurovascular injury unlikely  Additional history obtained: Additional history obtained from spouse Records reviewed N/A  ED Course and Reassessment: Patient was initially evaluated by triage and had x-rays performed of her left shoulder and humerus.  X-rays confirmed a left humeral neck fracture with minimal displacement.  She is neurovascularly intact.   She will be placed in a shoulder sling and will be given pain control.  No other traumatic injuries were seen on exam.  She is stable for discharge home with outpatient orthopedic follow-up.  Independent labs interpretation:  N/A  Independent visualization of imaging: - I independently visualized the following imaging with scope of interpretation limited to determining acute life threatening conditions related to emergency care: L shoulder/humerus XR, which revealed L humeral neck fx  Consultation: - Consulted or discussed management/test interpretation w/ external professional: N/A  Consideration for admission or further workup: Patient has no emergent conditions requiring admission or further work-up at this time and is stable for discharge home with orthopedics follow-up  Social Determinants of health: N/A    Amount and/or Complexity of Data Reviewed Radiology: ordered.  Risk OTC drugs. Prescription drug management.          Final Clinical Impression(s) / ED Diagnoses Final diagnoses:  Closed displaced fracture of surgical neck of left humerus, unspecified fracture morphology, initial encounter  Fall, initial encounter    Rx / DC Orders ED Discharge Orders          Ordered    oxyCODONE (ROXICODONE) 5 MG immediate release tablet  Every 6 hours PRN        12/21/22 1823    senna-docusate (SENOKOT-S) 8.6-50 MG tablet  Daily        12/21/22 1823              Rexford Maus, DO 12/21/22 1840

## 2022-12-21 NOTE — Discharge Instructions (Signed)
You were seen in the emergency department after your fall.  You did break your your humerus bone and we have placed you in a sling.  You can take this off to shower but otherwise should wear at all times until further instructed by orthopedics.  You should follow-up with them within the next week.  You should take Tylenol and Motrin every 6 hours as needed for pain and you can take oxycodone for breakthrough pain.  This can make you drowsy so do not take it with your Xanax and do not take it before driving, working or operating heavy machinery.  It can also make you constipated so you should take a stool softener daily while you are on the narcotics.  You can ice your shoulder and keep it elevated to help with the swelling.  You should return to the emergency department if your pain gets significantly worse, you have numbness in your arm, your fingers turn blue or you have any other new or concerning symptoms.

## 2022-12-21 NOTE — ED Triage Notes (Signed)
Pt states that she was vacuuming and tripped over the cord, injuring her L upper arm. Pt denies head injury/LOC. Pt on Plavix. Pt received 200 mcg fentanyl enroute with EMS.

## 2022-12-21 NOTE — Progress Notes (Signed)
Orthopedic Tech Progress Note Patient Details:  Amy Hansen 03/08/1956 [id number]  Ortho Devices Type of Ortho Device: Shoulder immobilizer Ortho Device/Splint Location: LUE Ortho Device/Splint Interventions: Ordered, Application, Adjustment   Post Interventions Patient Tolerated: Poor Instructions Provided: Adjustment of device, Care of device  [place] A Gerilyn Pilgrim 12/21/2022, 5:51 PM

## 2022-12-24 DIAGNOSIS — S42202A Unspecified fracture of upper end of left humerus, initial encounter for closed fracture: Secondary | ICD-10-CM | POA: Diagnosis not present

## 2022-12-25 ENCOUNTER — Telehealth: Payer: Self-pay

## 2022-12-25 NOTE — Telephone Encounter (Signed)
Called and left message for patient to go to the labs for LFTs for Dr. Adela Lank this week

## 2022-12-26 ENCOUNTER — Ambulatory Visit: Payer: Self-pay | Admitting: *Deleted

## 2022-12-26 ENCOUNTER — Encounter: Payer: Self-pay | Admitting: Physician Assistant

## 2022-12-26 ENCOUNTER — Telehealth (INDEPENDENT_AMBULATORY_CARE_PROVIDER_SITE_OTHER): Payer: BC Managed Care – PPO | Admitting: Physician Assistant

## 2022-12-26 DIAGNOSIS — F319 Bipolar disorder, unspecified: Secondary | ICD-10-CM

## 2022-12-26 DIAGNOSIS — F1320 Sedative, hypnotic or anxiolytic dependence, uncomplicated: Secondary | ICD-10-CM

## 2022-12-26 DIAGNOSIS — R2689 Other abnormalities of gait and mobility: Secondary | ICD-10-CM

## 2022-12-26 DIAGNOSIS — F411 Generalized anxiety disorder: Secondary | ICD-10-CM | POA: Diagnosis not present

## 2022-12-26 DIAGNOSIS — S42202D Unspecified fracture of upper end of left humerus, subsequent encounter for fracture with routine healing: Secondary | ICD-10-CM | POA: Diagnosis not present

## 2022-12-26 DIAGNOSIS — G2581 Restless legs syndrome: Secondary | ICD-10-CM

## 2022-12-26 MED ORDER — ALPRAZOLAM 0.5 MG PO TABS: ORAL_TABLET | ORAL | 0 refills | Status: DC

## 2022-12-26 NOTE — Telephone Encounter (Signed)
  Chief Complaint: localized rash/hives- left arm Symptoms: itching, hives- comes and goes Frequency: 3 days ago Pertinent Negatives: Patient denies fever, tongue swelling, difficulty breathing, abdomen pain Disposition: [] ED /[] Urgent Care (no appt availability in office) / [x] Appointment(In office/virtual)/ []  [facility]/ [] Home Care/ [] Refused Recommended Disposition /[] [facility]/ []  Follow-up with PCP Additional Notes: Patient has been under a lot of stress and this broken arm is really getting to her- tearful on the phone. Patient will continue to treat her symptoms and keep appointment for am- she is aware to go to ED for increasing or changing symptoms.

## 2022-12-26 NOTE — Progress Notes (Signed)
[facility]  Patient ID: Amy Hansen,  MRN: [id number]  PCP: [name], NP  Date of Evaluation: 12/26/2022  Time spent:20 minutes  Chief Complaint:  Chief Complaint   Anxiety; Depression; Follow-up   Virtual Visit via Telehealth  I connected with patient by a video enabled telemedicine application with their informed consent, and verified patient privacy and that I am speaking with the correct person using two identifiers.  I am private, in my office and the patient is at home.  I discussed the limitations, risks, security and privacy concerns of performing an evaluation and management service by video and the availability of in person appointments. I also discussed with the patient that there may be a patient responsible charge related to this service. The patient expressed understanding and agreed to proceed.   I discussed the assessment and treatment plan with the patient. The patient was provided an opportunity to ask questions and all were answered. The patient agreed with the plan and demonstrated an understanding of the instructions.   The patient was advised to call back or seek an in-person evaluation if the symptoms worsen or if the condition fails to improve as anticipated.  I provided 20 minutes of non-face-to-face time during this encounter.  HISTORY/CURRENT STATUS: For 4 week med check.  Her husband is with her during appt.  She fell last week, tripped over the vacuum cleaner cord and broke her arm.  She did not faint and did not feel dizzy.  In a lot of pain.  Does not need surgery.  Prior to this accident her mood had been good and she had tolerated the decreased Xanax well.  Her husband said she was not as anxious about when she would be able to get the next dose.  Has not been having panic attacks.  But since breaking her arm she has been more overwhelmed.  Energy and motivation have been stable.  Has not been able to work due to the fracture.   Prior to the injury she had been able to enjoy things as normal.  No extreme sadness, tearfulness, or feelings of hopelessness.  Sleeps well most of the time. ADLs and personal hygiene are normal.   Denies any changes in concentration, making decisions, or remembering things.  Appetite has not changed.  Weight is stable.  Denies suicidal or homicidal thoughts.  Patient denies increased energy with decreased need for sleep, increased talkativeness, racing thoughts, impulsivity or risky behaviors, increased spending, increased libido, grandiosity, increased irritability or anger, paranoia, or hallucinations.  Denies dizziness, syncope, seizures, numbness, tingling, tremor, tics, unsteady gait, slurred speech, confusion. Denies muscle or joint pain, stiffness, or dystonia.  Individual Medical History/ Review of Systems: Changes? :Yes    see HPI  Past medications for mental health diagnoses include: Trazodone, Risperdal, Zoloft, Lunesta, prazosin, Sonata, Prozac, Depakote, Lamictal, lithium, Wellbutrin, Xanax, Ambien, carbamazepine, Seroquel, Buspar caused falls and hallucinations, Gabapentin-she doesn't want to take  Allergies: Avelox [moxifloxacin hcl in nacl], Bactrim [sulfamethoxazole-trimethoprim], Ciprofloxacin, Buspar [buspirone], Neomycin-polymyxin-dexameth, Aquaphor [lanolin-petrolatum], Depakote [divalproex sodium], Imitrex [sumatriptan], and Stadol [butorphanol]  Current Medications:  Current Outpatient Medications:    ALPRAZolam (XANAX) 0.5 MG tablet, 1 po q am, and 1.5 po qhs, Disp: 75 tablet, Rfl: 0   aspirin EC 81 MG tablet, Take 1 tablet (81 mg) by mouth once daily. Swallow whole., Disp: , Rfl:    buPROPion (WELLBUTRIN XL) 150 MG 24 hr tablet, Take 3 tablets (450 mg total) by mouth daily., Disp: 270 tablet, Rfl:  3   conjugated estrogens (PREMARIN) vaginal cream, Apply one pea-sized amount around the opening of the urethra daily for 2 weeks, then 3 times weekly moving forward., Disp: 30  g, Rfl: 4   furosemide (LASIX) 20 MG tablet, Take 1 tablet (20 mg total) by mouth daily. May take an additional tablet (20mg ) AS NEEDED for weight gain and/or swelling., Disp: 90 tablet, Rfl: 3   lamoTRIgine (LAMICTAL) 200 MG tablet, Take 2 tablets (400 mg total) by mouth 2 (two) times daily., Disp: 60 tablet, Rfl: 11   ondansetron (ZOFRAN-ODT) 8 MG disintegrating tablet, Take 1 tablet (8 mg total) by mouth every 8 (eight) hours as needed for nausea or vomiting., Disp: 60 tablet, Rfl: 1   oxyCODONE (ROXICODONE) 5 MG immediate release tablet, Take 1 tablet (5 mg total) by mouth every 6 (six) hours as needed for severe pain., Disp: 12 tablet, Rfl: 0   pramipexole (MIRAPEX) 1 MG tablet, Take 1 mg by mouth at bedtime., Disp: , Rfl:    promethazine (PHENERGAN) 25 MG tablet, Take 1 tablet (25 mg total) by mouth every 8 (eight) hours as needed for nausea or vomiting., Disp: 20 tablet, Rfl: 0   QUEtiapine (SEROQUEL) 300 MG tablet, Take 1 tablet (300 mg total) by mouth at bedtime., Disp: 30 tablet, Rfl: 5   rOPINIRole (REQUIP) 1 MG tablet, Take 1 tablet (1 mg total) by mouth at bedtime., Disp: 90 tablet, Rfl: 1   rosuvastatin (CRESTOR) 5 MG tablet, Take 1 tablet by mouth once daily, Disp: 90 tablet, Rfl: 1   senna-docusate (SENOKOT-S) 8.6-50 MG tablet, Take 1 tablet by mouth daily., Disp: 30 tablet, Rfl: 0   sertraline (ZOLOFT) 100 MG tablet, Take 2.5 tablets (250 mg total) by mouth daily., Disp: 75 tablet, Rfl: 0   tirzepatide (MOUNJARO) 5 MG/0.5ML Pen, INJECT 1 SYRINGE SUBCUTANEOUSLY ONCE A WEEK, Disp: 12 mL, Rfl: 1   vedolizumab (ENTYVIO) 300 MG injection, Inject into the vein. Every 4 weeks, Disp: , Rfl:  Medication Side Effects: none  Family Medical/ Social History: Changes?  no  MENTAL HEALTH EXAM:  There were no vitals taken for this visit.There is no height or weight on file to calculate BMI.  General Appearance: Casual, Neat, Well Groomed, and in left shoulder sling  Eye Contact:  Good   Speech:  Clear and Coherent and Normal Rate  Volume:  Normal  Mood:  Euthymic  Affect:  Congruent  Thought Process:  Goal Directed and Descriptions of Associations: Circumstantial  Orientation:  Full (Time, Place, and Person)  Thought Content: Logical   Suicidal Thoughts:  No  Homicidal Thoughts:  No  Memory:   stable   Judgement:  Good  Insight:  Good  Psychomotor Activity:  Normal  Concentration:  Concentration: Good and Attention Span: Good  Recall:  Good  Fund of Knowledge: Good  Language: Good  Assets:  Desire for Improvement Financial Resources/Insurance Housing Transportation Vocational/Educational  ADL's:  Intact  Cognition: WNL  Prognosis:  Good   DIAGNOSES:    ICD-10-CM   1. Generalized anxiety disorder  F41.1     2. Benzodiazepine dependence (HCC)  F13.20     3. Bipolar I disorder (HCC)  F31.9     4. Restless legs syndrome (RLS)  G25.81     5. Imbalance  R26.89      Receiving Psychotherapy: No   RECOMMENDATIONS:  PDMP reviewed.  Last Xanax was filled 11/28/2022.  Oxycodone given 12/24/2022. I provided 20 minutes of non-face-to-face time during this encounter,  including time spent before and after the visit in records review, medical decision making, counseling pertinent to today's visit, and charting.   Given the fact that she just broke her arm, I think it would be difficult for her to decrease the Xanax even more.  Her husband and patient agree.  No change will be made but I will see her in 2 weeks and will probably decrease at that time.  Continue Xanax 0.5 mg, to 1 (0.5 mg) po q am, 1.5 (0.75 mg) po at night.  Continue Wellbutrin XL 150 mg, 3 p.o. every morning. Continue Lamictal 200 mg, 1 p.o. twice daily.   Continue Seroquel 300 mg, 1 p.o. nightly. Continue Pramipexole 1 mg qhs by PCP.  Continue Requip 1 mg bid (PCP) Continue Zoloft 100 mg 2.5 pills daily. They're aware this is 50 mg over the usual max dose, however in Dr. [place] Pink  prescribers guide, which is the leading publication of psych medications, some pts may need to increase to 400 mg.  Recommend counseling. Return in 2 weeks.   Melony Overly, PA-C

## 2022-12-26 NOTE — Telephone Encounter (Signed)
  Reason for Disposition  [1] MODERATE-SEVERE hives persist (i.e., hives interfere with normal activities or work) AND [2] taking antihistamine (e.g., Benadryl, Claritin) > 24 hours  Answer Assessment - Initial Assessment Questions 1. APPEARANCE: "What does the rash look like?"      Red rash, "whelps" elbow down to wrist/hand 2. LOCATION: "Where is the rash located?"      Left-Arm only 3. NUMBER: "How many hives are there?"      multiple 4. SIZE: "How big are the hives?" (inches, cm, compare to coins) "Do they all look the same or is there lots of variation in shape and size?"      Covering whole forearm 5. ONSET: "When did the hives begin?" (Hours or days ago)      Started 3 days ago 6. ITCHING: "Does it itch?" If Yes, ask: "How bad is the itch?"    - MILD: doesn't interfere with normal activities   - MODERATE-SEVERE: interferes with work, school, sleep, or other activities      Yes- severe 7. RECURRENT PROBLEM: "Have you had hives before?" If Yes, ask: "When was the last time?" and "What happened that time?"      Only change is the sling 8. TRIGGERS: "Were you exposed to any new food, plant, cosmetic product or animal just before the hives began?"     Only the sling material, patient is taking medication every 4-6 hours 9. OTHER SYMPTOMS: "Do you have any other symptoms?" (e.g., fever, tongue swelling, difficulty breathing, abdomen pain)     no  Protocols used: Hives-A-AH

## 2022-12-27 ENCOUNTER — Telehealth: Payer: Self-pay | Admitting: *Deleted

## 2022-12-27 ENCOUNTER — Ambulatory Visit: Payer: BC Managed Care – PPO | Admitting: Family Medicine

## 2022-12-27 DIAGNOSIS — E119 Type 2 diabetes mellitus without complications: Secondary | ICD-10-CM | POA: Diagnosis not present

## 2022-12-27 DIAGNOSIS — Z0100 Encounter for examination of eyes and vision without abnormal findings: Secondary | ICD-10-CM | POA: Diagnosis not present

## 2022-12-27 DIAGNOSIS — H2513 Age-related nuclear cataract, bilateral: Secondary | ICD-10-CM | POA: Diagnosis not present

## 2022-12-27 LAB — HM DIABETES EYE EXAM

## 2022-12-27 NOTE — Progress Notes (Signed)
  Care Coordination   Note   12/27/2022 Name: Amy Hansen MRN: [id number] DOB: Jul 13, 1956  Amy Hansen is a 67 y.o. year old female who sees [name], NP for primary care. I reached out to [name] by phone today to offer care coordination services.  Ms. Kinloch was given information about Care Coordination services today including:   The Care Coordination services include support from the care team which includes your Nurse Coordinator, Clinical Social Worker, or Pharmacist.  The Care Coordination team is here to help remove barriers to the health concerns and goals most important to you. Care Coordination services are voluntary, and the patient may decline or stop services at any time by request to their care team member.   Care Coordination Consent Status: Patient agreed to services and verbal consent obtained.   Follow up plan:  Telephone appointment with care coordination team member scheduled for:  01/04/2023  Encounter Outcome:  Pt. Scheduled  Burman Nieves, CCMA Care Coordination Care Guide Direct Dial: [phone]

## 2022-12-28 ENCOUNTER — Encounter: Payer: Self-pay | Admitting: Oncology

## 2022-12-28 ENCOUNTER — Encounter: Payer: Self-pay | Admitting: Gastroenterology

## 2022-12-31 ENCOUNTER — Encounter: Payer: Self-pay | Admitting: Gastroenterology

## 2022-12-31 ENCOUNTER — Encounter: Payer: Self-pay | Admitting: Nurse Practitioner

## 2022-12-31 ENCOUNTER — Ambulatory Visit: Payer: Medicare Other | Admitting: Nurse Practitioner

## 2022-12-31 ENCOUNTER — Encounter: Payer: Self-pay | Admitting: Oncology

## 2022-12-31 VITALS — Ht 60.0 in | Wt 112.0 lb

## 2022-12-31 DIAGNOSIS — S42412D Displaced simple supracondylar fracture without intercondylar fracture of left humerus, subsequent encounter for fracture with routine healing: Secondary | ICD-10-CM | POA: Diagnosis not present

## 2022-12-31 DIAGNOSIS — K5100 Ulcerative (chronic) pancolitis without complications: Secondary | ICD-10-CM

## 2022-12-31 NOTE — Telephone Encounter (Signed)
-----   Message from Cooper Render, CMA sent at 11/05/2022  4:11 PM EDT ----- Regarding: due for labs Remind patient that she is due for labs in June (cbc and ibc/ferritin)

## 2022-12-31 NOTE — Telephone Encounter (Signed)
MyChart message sent patient to go to the lab for LFTs this week

## 2022-12-31 NOTE — Progress Notes (Signed)
Ht 5' (1.524 m)   Wt 112 lb (50.8 kg)   LMP  (LMP Unknown)   BMI 21.87 kg/m    Subjective:    Patient ID: Amy Hansen, female    DOB: Mar 31, 1956, 67 y.o.   MRN: [id number]  HPI: Amy Hansen is a 67 y.o. female  Chief Complaint  Patient presents with   Arm Injury    Her for follow up on her arm fracture of the Left surgical neck of the Humerus.    Patient states she was vacuuming and tripped over the cord and fell back and broke her left Humerus.  She has seen Ortho twice.  Is in a sling for 4-6 weeks.  She still has a lot of pain.  Taking oxycodone doesn't really help with the pain.  Her arm is making her down.  She has had thought that she would better off not here.  However, she doesn't have a plan or intentions to harm herself or others.    Relevant past medical, surgical, family and social history reviewed and updated as indicated. Interim medical history since our last visit reviewed. Allergies and medications reviewed and updated.  Review of Systems  Musculoskeletal:        Left arm pain    Per HPI unless specifically indicated above     Objective:    Ht 5' (1.524 m)   Wt 112 lb (50.8 kg)   LMP  (LMP Unknown)   BMI 21.87 kg/m   Wt Readings from Last 3 Encounters:  12/31/22 112 lb (50.8 kg)  12/21/22 112 lb (50.8 kg)  12/05/22 113 lb 12.8 oz (51.6 kg)    Physical Exam Vitals and nursing note reviewed.  Constitutional:      General: She is not in acute distress.    Appearance: Normal appearance. She is normal weight. She is not ill-appearing, toxic-appearing or diaphoretic.  HENT:     Head: Normocephalic.     Right Ear: External ear normal.     Left Ear: External ear normal.     Nose: Nose normal.     Mouth/Throat:     Mouth: Mucous membranes are moist.     Pharynx: Oropharynx is clear.  Eyes:     General:        Right eye: No discharge.        Left eye: No discharge.     Extraocular Movements: Extraocular movements intact.      Conjunctiva/sclera: Conjunctivae normal.     Pupils: Pupils are equal, round, and reactive to light.  Cardiovascular:     Rate and Rhythm: Normal rate and regular rhythm.     Heart sounds: No murmur heard. Pulmonary:     Effort: Pulmonary effort is normal. No respiratory distress.     Breath sounds: Normal breath sounds. No wheezing or rales.  Musculoskeletal:     Cervical back: Normal range of motion and neck supple.     Comments: Left arm in a sling  Skin:    General: Skin is warm and dry.     Capillary Refill: Capillary refill takes less than 2 seconds.  Neurological:     General: No focal deficit present.     Mental Status: She is alert and oriented to person, place, and time. Mental status is at baseline.  Psychiatric:        Mood and Affect: Mood normal.        Behavior: Behavior normal.  Thought Content: Thought content normal.        Judgment: Judgment normal.     Results for orders placed or performed in visit on 11/23/22  IBC + Ferritin  Result Value Ref Range   Iron 54 42 - 145 ug/dL   Transferrin 161.0 (L) 212.0 - 360.0 mg/dL   Saturation Ratios 96.0 20.0 - 50.0 %   Ferritin 89.8 10.0 - 291.0 ng/mL   TIBC 245.0 (L) 250.0 - 450.0 mcg/dL  CBC with Differential/Platelet  Result Value Ref Range   WBC 5.2 4.0 - 10.5 K/uL   RBC 3.89 3.87 - 5.11 Mil/uL   Hemoglobin 12.0 12.0 - 15.0 g/dL   HCT 45.4 09.8 - 11.9 %   MCV 93.0 78.0 - 100.0 fl   MCHC 33.0 30.0 - 36.0 g/dL   RDW 14.7 82.9 - 56.2 %   Platelets 344.0 150.0 - 400.0 K/uL   Neutrophils Relative % 69.3 43.0 - 77.0 %   Lymphocytes Relative 23.9 12.0 - 46.0 %   Monocytes Relative 6.2 3.0 - 12.0 %   Eosinophils Relative 0.3 0.0 - 5.0 %   Basophils Relative 0.3 0.0 - 3.0 %   Neutro Abs 3.6 1.4 - 7.7 K/uL   Lymphs Abs 1.3 0.7 - 4.0 K/uL   Monocytes Absolute 0.3 0.1 - 1.0 K/uL   Eosinophils Absolute 0.0 0.0 - 0.7 K/uL   Basophils Absolute 0.0 0.0 - 0.1 K/uL  Hepatic function panel  Result Value Ref Range    Total Bilirubin 0.2 0.2 - 1.2 mg/dL   Bilirubin, Direct 0.1 0.0 - 0.3 mg/dL   Alkaline Phosphatase 139 (H) 39 - 117 U/L   AST 26 0 - 37 U/L   ALT 17 0 - 35 U/L   Total Protein 6.2 6.0 - 8.3 g/dL   Albumin 3.2 (L) 3.5 - 5.2 g/dL      Assessment & Plan:   Problem List Items Addressed This Visit   None Visit Diagnoses     Closed supracondylar fracture of left humerus with routine healing, subsequent encounter    -  Primary   Continue to follow up with Ortho.  Continue with their recommendations.  Follow up if not improved.        Follow up plan: Return in about 6 weeks (around 02/11/2023) for Welcome to Medicare.

## 2022-12-31 NOTE — Progress Notes (Deleted)
LMP  (LMP Unknown)    Subjective:    Patient ID: Amy Hansen, female    DOB: 08-02-1955, 67 y.o.   MRN: [id number]  HPI: Amy Hansen is a 67 y.o. female presenting on 12/31/2022 for comprehensive medical examination. Current medical complaints include:{Blank single:19197::"none","***"}  She currently lives with: Menopausal Symptoms: {Blank single:19197::"yes","no"}  Functional Status Survey:       08/29/2022    2:12 PM 07/18/2022    8:48 AM 05/11/2022    1:35 PM 01/18/2022    3:24 PM 08/01/2021    9:34 AM  Fall Risk   Falls in the past year? 1 1 1 1 1   Number falls in past yr: 1 1 0 1 1  Injury with Fall? 1 0 0 1 1  Risk for fall due to : History of fall(s);Impaired balance/gait History of fall(s) History of fall(s) History of fall(s) Medication side effect;Impaired balance/gait  Follow up Falls evaluation completed Falls evaluation completed Falls evaluation completed Falls evaluation completed Falls evaluation completed    Depression Screen    07/18/2022    8:48 AM 01/18/2022    3:24 PM 08/01/2021    9:34 AM 02/20/2021    3:02 PM 10/27/2020    8:12 AM  Depression screen PHQ 2/9  Decreased Interest 1 2 0 3 0  Down, Depressed, Hopeless 1 2 0 1 0  PHQ - 2 Score 2 4 0 4 0  Altered sleeping 3 2 1 3 3   Tired, decreased energy 3 3 3 3 3   Change in appetite 1 3 1 3 3   Feeling bad or failure about yourself  0 3 0 0 0  Trouble concentrating 3 3 3 1  0  Moving slowly or fidgety/restless 1 3 1 3  0  Suicidal thoughts 0 1 0 0 0  PHQ-9 Score 13 22 9 17 9   Difficult doing work/chores Somewhat difficult Extremely dIfficult Not difficult at all Somewhat difficult Not difficult at all     Advanced Directives Does patient have a HCPOA?    {Blank single:19197::"yes","no"} If yes, name and contact information:  Does patient have a living will or MOST form?  {Blank single:19197::"yes","no"}  Past Medical History:  Past Medical History:  Diagnosis Date   Anemia    Anxiety     Arthritis    Blood transfusion without reported diagnosis    Cataract    CHF (congestive heart failure) (HCC)    Chronic kidney disease    UTI, hematuria in urine   Colitis    Crohn's disease (HCC)    Depression    Diabetes (HCC)    Diverticulosis    Frequent headaches    Interstitial cystitis    Recurrent UTI    Restless leg syndrome    TIA (transient ischemic attack)    more than year ago per pt 12-26-21   TIA (transient ischemic attack) 02/20/2021   Urinary frequency     Surgical History:  Past Surgical History:  Procedure Laterality Date   bariatric bypass  2012   BIOPSY  05/03/2020   Procedure: BIOPSY;  Surgeon: Rachael Fee, MD;  Location: [facility] ENDOSCOPY;  Service: Endoscopy;;   CARPAL TUNNEL RELEASE Right 2003   CARPAL TUNNEL RELEASE Right    2008   CHOLECYSTECTOMY  1975   COLONOSCOPY     COLONOSCOPY WITH PROPOFOL N/A 05/03/2020   Procedure: COLONOSCOPY WITH PROPOFOL;  Surgeon: Rachael Fee, MD;  Location: [facility] ENDOSCOPY;  Service: Endoscopy;  Laterality: N/A;  CYSTOSCOPY W/ RETROGRADES Bilateral 06/06/2015   Procedure: CYSTOSCOPY WITH RETROGRADE PYELOGRAM;  Surgeon: Jerilee Field, MD;  Location: [facility] ORS;  Service: Urology;  Laterality: Bilateral;   EYE SURGERY Left 07/06/2022   FL INJ LEFT KNEE CT ARTHROGRAM ([facility] HX) Left    1995   GASTRIC BYPASS  2010   HAND SURGERY Left 01/19/2021   Thumb   HEMORRHOID SURGERY  2013   KNEE ARTHROSCOPY Left 1996   TONSILLECTOMY     TOTAL ABDOMINAL HYSTERECTOMY W/ BILATERAL SALPINGOOPHORECTOMY      Medications:  Current Outpatient Medications on File Prior to Visit  Medication Sig   ALPRAZolam (XANAX) 0.5 MG tablet 1 po q am, and 1.5 po qhs   aspirin EC 81 MG tablet Take 1 tablet (81 mg) by mouth once daily. Swallow whole.   buPROPion (WELLBUTRIN XL) 150 MG 24 hr tablet Take 3 tablets (450 mg total) by mouth daily.   conjugated estrogens (PREMARIN) vaginal cream Apply one pea-sized amount around the opening of the  urethra daily for 2 weeks, then 3 times weekly moving forward.   furosemide (LASIX) 20 MG tablet Take 1 tablet (20 mg total) by mouth daily. May take an additional tablet (20mg ) AS NEEDED for weight gain and/or swelling.   lamoTRIgine (LAMICTAL) 200 MG tablet Take 2 tablets (400 mg total) by mouth 2 (two) times daily.   ondansetron (ZOFRAN-ODT) 8 MG disintegrating tablet Take 1 tablet (8 mg total) by mouth every 8 (eight) hours as needed for nausea or vomiting.   oxyCODONE (ROXICODONE) 5 MG immediate release tablet Take 1 tablet (5 mg total) by mouth every 6 (six) hours as needed for severe pain.   pramipexole (MIRAPEX) 1 MG tablet Take 1 mg by mouth at bedtime.   promethazine (PHENERGAN) 25 MG tablet Take 1 tablet (25 mg total) by mouth every 8 (eight) hours as needed for nausea or vomiting.   QUEtiapine (SEROQUEL) 300 MG tablet Take 1 tablet (300 mg total) by mouth at bedtime.   rOPINIRole (REQUIP) 1 MG tablet Take 1 tablet (1 mg total) by mouth at bedtime.   rosuvastatin (CRESTOR) 5 MG tablet Take 1 tablet by mouth once daily   senna-docusate (SENOKOT-S) 8.6-50 MG tablet Take 1 tablet by mouth daily.   sertraline (ZOLOFT) 100 MG tablet Take 2.5 tablets (250 mg total) by mouth daily.   tirzepatide (MOUNJARO) 5 MG/0.5ML Pen INJECT 1 SYRINGE SUBCUTANEOUSLY ONCE A WEEK   vedolizumab (ENTYVIO) 300 MG injection Inject into the vein. Every 4 weeks   No current facility-administered medications on file prior to visit.    Allergies:  Allergies  Allergen Reactions   Avelox [Moxifloxacin Hcl In Nacl] Anaphylaxis   Bactrim [Sulfamethoxazole-Trimethoprim] Anaphylaxis   Ciprofloxacin Other (See Comments)    Pt states she was told never to take this as it is in the same family as Avelox.    Buspar [Buspirone] Other (See Comments)    hallucinations   Neomycin-Polymyxin-Dexameth Other (See Comments)    Burning in the eyes   Aquaphor [Lanolin-Petrolatum] Itching and Rash   Depakote [Divalproex Sodium]  Other (See Comments)    Unknown Reaction   Imitrex [Sumatriptan] Other (See Comments)    Neck and shoulder pain   Stadol [Butorphanol] Rash    Social History:  Social History   Socioeconomic History   Marital status: Married    Spouse name: Not on file   Number of children: Not on file   Years of education: Not on file   Highest education  level: Not on file  Occupational History   Not on file  Tobacco Use   Smoking status: Former    Packs/day: .5    Types: Cigarettes    Quit date: 04/25/1975    Years since quitting: 47.7   Smokeless tobacco: Never   Tobacco comments:    quit 40 years ago  Vaping Use   Vaping Use: Never used  Substance and Sexual Activity   Alcohol use: No    Alcohol/week: 0.0 standard drinks of alcohol   Drug use: No   Sexual activity: Not Currently    Birth control/protection: Post-menopausal, Surgical  Other Topics Concern   Not on file  Social History Narrative   Caffeine 5 servings per day.   Social Determinants of Health   Financial Resource Strain: Medium Risk (04/12/2021)   Overall Financial Resource Strain (CARDIA)    Difficulty of Paying Living Expenses: Somewhat hard  Food Insecurity: No Food Insecurity (03/06/2022)   Hunger Vital Sign    Worried About Running Out of Food in the Last Year: Never true    Ran Out of Food in the Last Year: Never true  Transportation Needs: No Transportation Needs (03/06/2022)   PRAPARE - Administrator, Civil Service (Medical): No    Lack of Transportation (Non-Medical): No  Physical Activity: Inactive (04/12/2021)   Exercise Vital Sign    Days of Exercise per Week: 0 days    Minutes of Exercise per Session: 0 min  Stress: No Stress Concern Present (03/06/2022)   [employer] of Occupational Health - Occupational Stress Questionnaire    Feeling of Stress : Only a little  Social Connections: Moderately Integrated (03/06/2022)   Social Connection and Isolation Panel [NHANES]    Frequency of  Communication with Friends and Family: More than three times a week    Frequency of Social Gatherings with Friends and Family: More than three times a week    Attends Religious Services: More than 4 times per year    Active Member of [employer] or Organizations: No    Attends Banker Meetings: Never    Marital Status: Married  Catering manager Violence: Not At Risk (03/06/2022)   Humiliation, Afraid, Rape, and Kick questionnaire    Fear of Current or Ex-Partner: No    Emotionally Abused: No    Physically Abused: No    Sexually Abused: No   Social History   Tobacco Use  Smoking Status Former   Packs/day: .5   Types: Cigarettes   Quit date: 04/25/1975   Years since quitting: 47.7  Smokeless Tobacco Never  Tobacco Comments   quit 40 years ago   Social History   Substance and Sexual Activity  Alcohol Use No   Alcohol/week: 0.0 standard drinks of alcohol    Family History:  Family History  Problem Relation Age of Onset   Colon cancer Mother    Stroke Father    Heart failure Sister    Bladder Cancer Neg Hx    Kidney disease Neg Hx    Prostate cancer Neg Hx    Kidney cancer Neg Hx    Pancreatic cancer Neg Hx    Esophageal cancer Neg Hx    Stomach cancer Neg Hx    Rectal cancer Neg Hx    Breast cancer Neg Hx     Past medical history, surgical history, medications, allergies, family history and social history reviewed with patient today and changes made to appropriate areas of the chart.  ROS  All other ROS negative except what is listed above and in the HPI.      Objective:    LMP  (LMP Unknown)   Wt Readings from Last 3 Encounters:  12/21/22 112 lb (50.8 kg)  12/05/22 113 lb 12.8 oz (51.6 kg)  11/23/22 114 lb 9.6 oz (52 kg)    No results found.  Physical Exam      No data to display          Cognitive Testing - 6-CIT  Correct? Score   What year is it? {YES NO:22349} {Numbers; 0-4:31231} Yes = 0    No = 4  What month is it? {YES NO:22349}  {Numbers; 0-4:31231} Yes = 0    No = 3  Remember:     Floyde Parkins, [address], [place]     What time is it? {YES NO:22349} {Numbers; 0-4:31231} Yes = 0    No = 3  Count backwards from 20 to 1 {YES NO:22349} {Numbers; 0-4:31231} Correct = 0    1 error = 2   More than 1 error = 4  Say the months of the year in reverse. {YES NO:22349} {Numbers; 0-4:31231} Correct = 0    1 error = 2   More than 1 error = 4  What address did I ask you to remember? {YES NO:22349} {NUMBERS; 0-10:5044} Correct = 0  1 error = 2    2 error = 4    3 error = 6    4 error = 8    All wrong = 10       TOTAL SCORE  {Numbers; 1-61:09604}/54   Interpretation:  {Desc; normal/abnormal:11317::"Normal"}  Normal (0-7) Abnormal (8-28)   Results for orders placed or performed in visit on 11/23/22  IBC + Ferritin  Result Value Ref Range   Iron 54 42 - 145 ug/dL   Transferrin 098.1 (L) 212.0 - 360.0 mg/dL   Saturation Ratios 19.1 20.0 - 50.0 %   Ferritin 89.8 10.0 - 291.0 ng/mL   TIBC 245.0 (L) 250.0 - 450.0 mcg/dL  CBC with Differential/Platelet  Result Value Ref Range   WBC 5.2 4.0 - 10.5 K/uL   RBC 3.89 3.87 - 5.11 Mil/uL   Hemoglobin 12.0 12.0 - 15.0 g/dL   HCT 47.8 29.5 - 62.1 %   MCV 93.0 78.0 - 100.0 fl   MCHC 33.0 30.0 - 36.0 g/dL   RDW 30.8 65.7 - 84.6 %   Platelets 344.0 150.0 - 400.0 K/uL   Neutrophils Relative % 69.3 43.0 - 77.0 %   Lymphocytes Relative 23.9 12.0 - 46.0 %   Monocytes Relative 6.2 3.0 - 12.0 %   Eosinophils Relative 0.3 0.0 - 5.0 %   Basophils Relative 0.3 0.0 - 3.0 %   Neutro Abs 3.6 1.4 - 7.7 K/uL   Lymphs Abs 1.3 0.7 - 4.0 K/uL   Monocytes Absolute 0.3 0.1 - 1.0 K/uL   Eosinophils Absolute 0.0 0.0 - 0.7 K/uL   Basophils Absolute 0.0 0.0 - 0.1 K/uL  Hepatic function panel  Result Value Ref Range   Total Bilirubin 0.2 0.2 - 1.2 mg/dL   Bilirubin, Direct 0.1 0.0 - 0.3 mg/dL   Alkaline Phosphatase 139 (H) 39 - 117 U/L   AST 26 0 - 37 U/L   ALT 17 0 - 35 U/L   Total Protein 6.2 6.0 -  8.3 g/dL   Albumin 3.2 (L) 3.5 - 5.2 g/dL  Assessment & Plan:   Problem List Items Addressed This Visit   None Visit Diagnoses     Annual physical exam    -  Primary   Screening for ischemic heart disease       Welcome to Medicare preventive visit            Preventative Services:  AAA screening:  Health Risk Assessment and Personalized Prevention Plan: Bone Mass Measurements: Breast Cancer Screening: CVD Screening:  Cervical Cancer Screening: Colon Cancer Screening:  Depression Screening:  Diabetes Screening:  Glaucoma Screening:  Hepatitis B vaccine: Hepatitis C screening:  HIV Screening: Flu Vaccine: Lung cancer Screening: Obesity Screening:  Pneumonia Vaccines (2): STI Screening:  Follow up plan: No follow-ups on file.   LABORATORY TESTING:  - Pap smear: {Blank single:19197::"pap done","not applicable","up to date","done elsewhere"}  IMMUNIZATIONS:   - Tdap: Tetanus vaccination status reviewed: {tetanus status:315746}. - Influenza: {Blank single:19197::"Up to date","Administered today","Postponed to flu season","Refused","Given elsewhere"} - Pneumovax: {Blank single:19197::"Up to date","Administered today","Not applicable","Refused","Given elsewhere"} - Prevnar: {Blank single:19197::"Up to date","Administered today","Not applicable","Refused","Given elsewhere"} - Zostavax vaccine: {Blank single:19197::"Up to date","Administered today","Not applicable","Refused","Given elsewhere"}  SCREENING: -Mammogram: {Blank single:19197::"Up to date","Ordered today","Not applicable","Refused","Done elsewhere"}  - Colonoscopy: {Blank single:19197::"Up to date","Ordered today","Not applicable","Refused","Done elsewhere"}  - Bone Density: {Blank single:19197::"Up to date","Ordered today","Not applicable","Refused","Done elsewhere"}  -Hearing Test: {Blank single:19197::"Up to date","Ordered today","Not applicable","Refused","Done elsewhere"}  -Spirometry: {Blank  single:19197::"Up to date","Ordered today","Not applicable","Refused","Done elsewhere"}   PATIENT COUNSELING:   Advised to take 1 mg of folate supplement per day if capable of pregnancy.   Sexuality: Discussed sexually transmitted diseases, partner selection, use of condoms, avoidance of unintended pregnancy  and contraceptive alternatives.   Advised to avoid cigarette smoking.  I discussed with the patient that most people either abstain from alcohol or drink within safe limits (<=14/week and <=4 drinks/occasion for males, <=7/weeks and <= 3 drinks/occasion for females) and that the risk for alcohol disorders and other health effects rises proportionally with the number of drinks per week and how often a drinker exceeds daily limits.  Discussed cessation/primary prevention of drug use and availability of treatment for abuse.   Diet: Encouraged to adjust caloric intake to maintain  or achieve ideal body weight, to reduce intake of dietary saturated fat and total fat, to limit sodium intake by avoiding high sodium foods and not adding table salt, and to maintain adequate dietary potassium and calcium preferably from fresh fruits, vegetables, and low-fat dairy products.    stressed the importance of regular exercise  Injury prevention: Discussed safety belts, safety helmets, smoke detector, smoking near bedding or upholstery.   Dental health: Discussed importance of regular tooth brushing, flossing, and dental visits.    NEXT PREVENTATIVE PHYSICAL DUE IN 1 YEAR. No follow-ups on file.

## 2023-01-01 ENCOUNTER — Encounter: Payer: Self-pay | Admitting: Nurse Practitioner

## 2023-01-01 DIAGNOSIS — S42202A Unspecified fracture of upper end of left humerus, initial encounter for closed fracture: Secondary | ICD-10-CM | POA: Diagnosis not present

## 2023-01-01 LAB — HEPATIC FUNCTION PANEL
ALT: 43 IU/L — ABNORMAL HIGH (ref 0–32)
AST: 33 IU/L (ref 0–40)
Albumin: 3.7 g/dL — ABNORMAL LOW (ref 3.9–4.9)
Alkaline Phosphatase: 182 IU/L — ABNORMAL HIGH (ref 44–121)
Bilirubin Total: <0.2 mg/dL (ref 0.0–1.2)
Bilirubin, Direct: <0.1 mg/dL (ref 0.00–0.40)
Total Protein: 6.2 g/dL (ref 6.0–8.5)

## 2023-01-01 NOTE — Progress Notes (Signed)
Good Morning. Amy Hansen was in my office yesterday and I saw that you wanted her LFTs.  I went ahead and drew them since she is having trouble getting around with her humerus fracture.

## 2023-01-02 ENCOUNTER — Ambulatory Visit: Payer: BC Managed Care – PPO

## 2023-01-04 ENCOUNTER — Ambulatory Visit: Payer: Self-pay | Admitting: *Deleted

## 2023-01-04 NOTE — Patient Instructions (Addendum)
Visit Information  Thank you for taking time to visit with me today. Please don't hesitate to contact me if I can be of assistance to you.   Following are the goals we discussed today:  Please continue to follow up with your medical providers as scheduled  If you are experiencing a Mental Health or Behavioral Health Crisis or need someone to talk to, please call the Suicide and Crisis Lifeline: 988   Patient verbalizes understanding of instructions and care plan provided today and agrees to view in MyChart. Active MyChart status and patient understanding of how to access instructions and care plan via MyChart confirmed with patient.     No further follow up required: patient to contact this Child psychotherapist with any additional community resource needs   [employer], LCSW Clinical Social Worker  [facility] Care Management [phone]

## 2023-01-04 NOTE — Patient Outreach (Signed)
  Care Coordination   Initial Visit Note   01/04/2023 Name: Amy Hansen MRN: [id number] DOB: 1956/04/21  Amy Hansen is a 67 y.o. year old female who sees Amy Grooms, NP for primary care. I spoke with  Amy Hansen by phone today.  What matters to the patients health and wellness today?  Patient discussed  continued adjustment to recently breaking her arm. Patient describes a supportive family who assists with her care needs, however states that managing with just one functioning arm has been difficult. Patient states that she will consider private duty care and will continue to follow up with her outpatient  behavioral team for medication management and will plan to request a therapist as well. Patient Patient also has an appointment with [facility] on 01/07/23. Patient confirmed having no additional community resource needs at this time.   Goals Addressed             This Visit's Progress    care coordination activities       Interventions Today    Flowsheet Row Most Recent Value  Chronic Disease   Chronic disease during today's visit Other  [broken left arm, currently in a brace, due to type of brace cannot put in a cast 4-6 weeks projected healing process]  General Interventions   General Interventions Discussed/Reviewed General Interventions Discussed, Doctor Visits  Doctor Visits Discussed/Reviewed Doctor Visits Reviewed  [appt on 6/10 [facility], will look at x-ray to see if surgery is needed]  Mental Health Interventions   Mental Health Discussed/Reviewed --  [medication management f/u scheduled for 6/11, patient agreeable to follow up with a therapist as well, emotional support provided, ongoing mental health follow up encouraged]  Nutrition Interventions   Nutrition Discussed/Reviewed Nutrition Discussed  [patient discused poor appetite, states will add more protein to diet -encouraged patient to discuss  option with provider]  Safety Interventions   Safety  Discussed/Reviewed Safety Discussed  [encouraged patient to contact [phone] in the event of a mental health counseling]              SDOH assessments and interventions completed:  Yes  SDOH Interventions Today    Flowsheet Row Most Recent Value  SDOH Interventions   Food Insecurity Interventions Intervention Not Indicated  Housing Interventions Intervention Not Indicated  Transportation Interventions Intervention Not Indicated  Depression Interventions/Treatment  Currently on Treatment, Medication        Care Coordination Interventions:  Yes, provided   Follow up plan: No further intervention required.   Encounter Outcome:  Pt. Visit Completed

## 2023-01-07 NOTE — Telephone Encounter (Signed)
Called and left detailed message for patient with results and recommendations from Dr. Adela Lank.  Will place recall for LFTs in 3 months. Patient can call back if she would like to have liver biopsy now instead of monitoring  per recommendation

## 2023-01-08 ENCOUNTER — Ambulatory Visit: Payer: BC Managed Care – PPO | Admitting: Nurse Practitioner

## 2023-01-08 ENCOUNTER — Encounter: Payer: Self-pay | Admitting: Physician Assistant

## 2023-01-08 ENCOUNTER — Telehealth (INDEPENDENT_AMBULATORY_CARE_PROVIDER_SITE_OTHER): Payer: BC Managed Care – PPO | Admitting: Physician Assistant

## 2023-01-08 DIAGNOSIS — F1320 Sedative, hypnotic or anxiolytic dependence, uncomplicated: Secondary | ICD-10-CM

## 2023-01-08 DIAGNOSIS — G4700 Insomnia, unspecified: Secondary | ICD-10-CM

## 2023-01-08 DIAGNOSIS — G2581 Restless legs syndrome: Secondary | ICD-10-CM

## 2023-01-08 DIAGNOSIS — F411 Generalized anxiety disorder: Secondary | ICD-10-CM | POA: Diagnosis not present

## 2023-01-08 DIAGNOSIS — F319 Bipolar disorder, unspecified: Secondary | ICD-10-CM

## 2023-01-08 MED ORDER — ALPRAZOLAM 0.5 MG PO TABS: ORAL_TABLET | ORAL | 0 refills | Status: DC

## 2023-01-08 MED ORDER — SERTRALINE HCL 100 MG PO TABS: 250.0000 mg | ORAL_TABLET | Freq: Every day | ORAL | 3 refills | Status: DC

## 2023-01-08 NOTE — Progress Notes (Signed)
[facility]  Patient ID: Amy Hansen,  MRN: [id number]  PCP: [name], NP  Date of Evaluation: 01/08/2023  Time spent:20 minutes  Chief Complaint:  Chief Complaint   Anxiety; Depression; Follow-up    Virtual Visit via Telehealth  I connected with patient by a video enabled telemedicine application with their informed consent, and verified patient privacy and that I am speaking with the correct person using two identifiers.  I am private, in my office and the patient is at home.  I discussed the limitations, risks, security and privacy concerns of performing an evaluation and management service by video and the availability of in person appointments. I also discussed with the patient that there may be a patient responsible charge related to this service. The patient expressed understanding and agreed to proceed.   I discussed the assessment and treatment plan with the patient. The patient was provided an opportunity to ask questions and all were answered. The patient agreed with the plan and demonstrated an understanding of the instructions.   The patient was advised to call back or seek an in-person evaluation if the symptoms worsen or if the condition fails to improve as anticipated.  I provided 20 minutes of non-face-to-face time during this encounter.  HISTORY/CURRENT STATUS: For 2 week med check.   Appointment is to discuss the continued weaning off Xanax.  She has tolerated a very gradual wean well for the most part.  However she broke her arm several weeks ago and is still recovering from that.  At our last visit 2 weeks ago we decided to leave the dose the same until this visit.  She is not going to require surgery on her arm but will have to start physical therapy soon.  She feels that the anxiety is well controlled.  Her husband dispenses the Xanax to her and states that she is not having periods where she is asking when the next dose can be given like  she did initially.  She is not having panic attacks.  Not having falls.  Patient is able to enjoy things.  Energy and motivation are ok. No extreme sadness, tearfulness, or feelings of hopelessness.  Sleeps well most of the time. ADLs and personal hygiene are normal.   Denies any changes in concentration, making decisions, or remembering things.  Appetite has not changed.  Weight is stable.  Denies cutting or any form of self-harm.  Denies suicidal or homicidal thoughts.  Patient denies increased energy with decreased need for sleep, increased talkativeness, racing thoughts, impulsivity or risky behaviors, increased spending, increased libido, grandiosity, increased irritability or anger, paranoia, or hallucinations.  Denies dizziness, syncope, seizures, numbness, tingling, tremor, tics, unsteady gait, slurred speech, confusion. Denies muscle or joint pain, stiffness, or dystonia.  Individual Medical History/ Review of Systems: Changes? :Yes    arm is healing, will start PT on 6/24, not going to need surgery.   Past medications for mental health diagnoses include: Trazodone, Risperdal, Zoloft, Lunesta, prazosin, Sonata, Prozac, Depakote, Lamictal, lithium, Wellbutrin, Xanax, Ambien, carbamazepine, Seroquel, Buspar caused falls and hallucinations, Gabapentin-she doesn't want to take  Allergies: Avelox [moxifloxacin hcl in nacl], Bactrim [sulfamethoxazole-trimethoprim], Ciprofloxacin, Buspar [buspirone], Neomycin-polymyxin-dexameth, Aquaphor [lanolin-petrolatum], Depakote [divalproex sodium], Imitrex [sumatriptan], and Stadol [butorphanol]  Current Medications:  Current Outpatient Medications:    aspirin EC 81 MG tablet, Take 1 tablet (81 mg) by mouth once daily. Swallow whole., Disp: , Rfl:    buPROPion (WELLBUTRIN XL) 150 MG 24 hr tablet, Take 3 tablets (450  mg total) by mouth daily., Disp: 270 tablet, Rfl: 3   cefUROXime (CEFTIN) 250 MG tablet, TAKE 1 TABLET BY MOUTH TWICE DAILY WITH A MEAL FOR 7  DAYS, Disp: , Rfl:    clopidogrel (PLAVIX) 75 MG tablet, , Disp: , Rfl:    conjugated estrogens (PREMARIN) vaginal cream, Apply one pea-sized amount around the opening of the urethra daily for 2 weeks, then 3 times weekly moving forward., Disp: 30 g, Rfl: 4   furosemide (LASIX) 20 MG tablet, Take 1 tablet (20 mg total) by mouth daily. May take an additional tablet (20mg ) AS NEEDED for weight gain and/or swelling., Disp: 90 tablet, Rfl: 3   lamoTRIgine (LAMICTAL) 200 MG tablet, Take 2 tablets (400 mg total) by mouth 2 (two) times daily., Disp: 60 tablet, Rfl: 11   ondansetron (ZOFRAN-ODT) 8 MG disintegrating tablet, Take 1 tablet (8 mg total) by mouth every 8 (eight) hours as needed for nausea or vomiting., Disp: 60 tablet, Rfl: 1   oxyCODONE (ROXICODONE) 5 MG immediate release tablet, Take 1 tablet (5 mg total) by mouth every 6 (six) hours as needed for severe pain., Disp: 12 tablet, Rfl: 0   pramipexole (MIRAPEX) 1 MG tablet, Take 1 mg by mouth at bedtime., Disp: , Rfl:    promethazine (PHENERGAN) 25 MG tablet, Take 1 tablet (25 mg total) by mouth every 8 (eight) hours as needed for nausea or vomiting., Disp: 20 tablet, Rfl: 0   propranolol (INDERAL) 10 MG tablet, TAKE ONE TABLET BY MOUTH TWICE DAILY FOR 7 DAYS. THEN INCREASE TO TAKE TWO TABLETS BY MOUTH TWICE DAILY THEREAFTER, Disp: , Rfl:    QUEtiapine (SEROQUEL) 300 MG tablet, Take 1 tablet (300 mg total) by mouth at bedtime., Disp: 30 tablet, Rfl: 5   rOPINIRole (REQUIP) 1 MG tablet, Take 1 tablet (1 mg total) by mouth at bedtime., Disp: 90 tablet, Rfl: 1   rosuvastatin (CRESTOR) 5 MG tablet, Take 1 tablet by mouth once daily, Disp: 90 tablet, Rfl: 1   senna-docusate (SENOKOT-S) 8.6-50 MG tablet, Take 1 tablet by mouth daily., Disp: 30 tablet, Rfl: 0   tirzepatide (MOUNJARO) 5 MG/0.5ML Pen, INJECT 1 SYRINGE SUBCUTANEOUSLY ONCE A WEEK, Disp: 12 mL, Rfl: 1   vedolizumab (ENTYVIO) 300 MG injection, Inject into the vein. Every 4 weeks, Disp: , Rfl:     ALPRAZolam (XANAX) 0.5 MG tablet, 1 po q am, and 1.5 po qhs, Disp: 75 tablet, Rfl: 0   sertraline (ZOLOFT) 100 MG tablet, Take 2.5 tablets (250 mg total) by mouth daily., Disp: 225 tablet, Rfl: 3 Medication Side Effects: none  Family Medical/ Social History: Changes?  no  MENTAL HEALTH EXAM:  There were no vitals taken for this visit.There is no height or weight on file to calculate BMI.  General Appearance: Casual, Neat, Well Groomed, and in left shoulder sling  Eye Contact:  Good  Speech:  Clear and Coherent and Normal Rate  Volume:  Normal  Mood:  Euthymic  Affect:  Congruent  Thought Process:  Goal Directed and Descriptions of Associations: Circumstantial  Orientation:  Full (Time, Place, and Person)  Thought Content: Logical   Suicidal Thoughts:  No  Homicidal Thoughts:  No  Memory:   stable   Judgement:  Good  Insight:  Good  Psychomotor Activity:  Normal  Concentration:  Concentration: Good and Attention Span: Good  Recall:  Good  Fund of Knowledge: Good  Language: Good  Assets:  Communication Skills Desire for Improvement Hydrologist  ADL's:  Intact  Cognition: WNL  Prognosis:  Good   DIAGNOSES:    ICD-10-CM   1. Generalized anxiety disorder  F41.1     2. Benzodiazepine dependence (HCC)  F13.20     3. Bipolar I disorder (HCC)  F31.9     4. Restless legs syndrome (RLS)  G25.81     5. Insomnia, unspecified type  G47.00       Receiving Psychotherapy: No   RECOMMENDATIONS:  PDMP reviewed.  Last Xanax was filled 12/26/2022.  Oxycodone given 01/07/2023. I provided 20 minutes of non-face-to-face time during this encounter, including time spent before and after the visit in records review, medical decision making, counseling pertinent to today's visit, and charting.   Because there may be more anxiety related to the physical therapy and continued healing of the fractured arm, I recommend  keeping the Xanax dose the same for now.  The risk of doing that however could be possible confusion and falls.  But she is on a much lower dose than even 6 months ago so I do not think that will be a problem.  She and her husband both understand this and feel that it is best for her to stay on the current dose of Xanax for now.  We agree that after her arm is healing more we will resume gradual wean of Xanax.  Continue Xanax 0.5 mg, to 1 (0.5 mg) po q am, 1.5 (0.75 mg) po at night.  Continue Wellbutrin XL 150 mg, 3 p.o. every morning. Continue Lamictal 200 mg, 1 p.o. twice daily.   Continue Seroquel 300 mg, 1 p.o. nightly. Continue Pramipexole 1 mg qhs by PCP.  Continue Requip 1 mg bid (PCP) Continue Zoloft 100 mg 2.5 pills daily. They're aware this is 50 mg over the usual max dose, however in Dr. Maryland Pink prescribers guide, which is the leading publication of psych medications, some pts may need to increase to 400 mg.  Recommend counseling. Return in 4 weeks.   Melony Overly, PA-C

## 2023-01-11 ENCOUNTER — Ambulatory Visit: Payer: BC Managed Care – PPO

## 2023-01-11 ENCOUNTER — Other Ambulatory Visit: Payer: Self-pay

## 2023-01-11 ENCOUNTER — Telehealth: Payer: Self-pay | Admitting: Physician Assistant

## 2023-01-11 NOTE — Telephone Encounter (Signed)
Requesting refill on Xanax called to:  [facility] [address] [place] - [address]   Phone: [phone]  Fax: [phone]

## 2023-01-11 NOTE — Telephone Encounter (Signed)
Pended.

## 2023-01-14 ENCOUNTER — Ambulatory Visit: Payer: BC Managed Care – PPO | Admitting: Urology

## 2023-01-17 ENCOUNTER — Ambulatory Visit (INDEPENDENT_AMBULATORY_CARE_PROVIDER_SITE_OTHER): Payer: BC Managed Care – PPO

## 2023-01-17 VITALS — BP 132/83 | HR 92 | Temp 98.1°F | Resp 16 | Ht 65.0 in | Wt 117.8 lb

## 2023-01-17 DIAGNOSIS — K5100 Ulcerative (chronic) pancolitis without complications: Secondary | ICD-10-CM | POA: Diagnosis not present

## 2023-01-17 MED ORDER — VEDOLIZUMAB 300 MG IV SOLR
300.0000 mg | Freq: Once | INTRAVENOUS | Status: AC
  Administered 2023-01-17: 300 mg via INTRAVENOUS
  Filled 2023-01-17: qty 5

## 2023-01-17 NOTE — Progress Notes (Signed)
Diagnosis: Crohn's Disease  Provider:  Chilton Greathouse MD  Procedure: IV Infusion  IV Type: Peripheral, IV Location: R Antecubital  Entyvio (Vedolizumab), Dose: 300 mg  Infusion Start Time: 1403  Infusion Stop Time: 1436  Post Infusion IV Care: Peripheral IV Discontinued  Discharge: Condition: Good, Destination: Home . AVS Provided  Performed by:  Loney Hering, LPN

## 2023-01-18 ENCOUNTER — Telehealth: Payer: Self-pay

## 2023-01-18 NOTE — Telephone Encounter (Signed)
4:58 PM I called pt, got VM, left msg for her to call back on Monday to disc meds. For the record, she broke her arm on 12/21/2022, she had a telehealth visit with me on 12/26/2022 and 01/08/2023.  Her husband was with her on that last visit.  I personally reviewed the medication list with Amy Hansen both times, as well as her husband on the visit 01/08/2023, and they stated she was taking all medications as listed in my note on 01/08/2023.

## 2023-01-18 NOTE — Telephone Encounter (Signed)
Patient reports not taking any of her meds for a month due to issues with her broken arm. She wants to start back and asking if she needs to start any at lower dosing.  Copied from last note:    Continue Xanax 0.5 mg, to 1 (0.5 mg) po q am, 1.5 (0.75 mg) po at night.  Continue Wellbutrin XL 150 mg, 3 p.o. every morning. Continue Lamictal 200 mg, 1 p.o. twice daily.   Continue Seroquel 300 mg, 1 p.o. nightly. Continue Pramipexole 1 mg qhs by PCP.  Continue Requip 1 mg bid (PCP) Continue Zoloft 100 mg 2.5 pills daily.

## 2023-01-20 ENCOUNTER — Encounter: Payer: Self-pay | Admitting: Oncology

## 2023-01-20 ENCOUNTER — Encounter: Payer: Self-pay | Admitting: Gastroenterology

## 2023-01-22 DIAGNOSIS — N390 Urinary tract infection, site not specified: Secondary | ICD-10-CM | POA: Diagnosis not present

## 2023-01-22 DIAGNOSIS — S22080A Wedge compression fracture of T11-T12 vertebra, initial encounter for closed fracture: Secondary | ICD-10-CM | POA: Diagnosis not present

## 2023-01-22 DIAGNOSIS — R300 Dysuria: Secondary | ICD-10-CM | POA: Diagnosis not present

## 2023-01-22 DIAGNOSIS — R81 Glycosuria: Secondary | ICD-10-CM | POA: Diagnosis not present

## 2023-01-22 DIAGNOSIS — M545 Low back pain, unspecified: Secondary | ICD-10-CM | POA: Diagnosis not present

## 2023-01-22 DIAGNOSIS — R319 Hematuria, unspecified: Secondary | ICD-10-CM | POA: Diagnosis not present

## 2023-01-24 ENCOUNTER — Other Ambulatory Visit: Payer: Self-pay | Admitting: Physician Assistant

## 2023-02-01 ENCOUNTER — Ambulatory Visit: Payer: Self-pay | Admitting: *Deleted

## 2023-02-01 ENCOUNTER — Telehealth: Payer: Self-pay | Admitting: Gastroenterology

## 2023-02-01 NOTE — Telephone Encounter (Signed)
Summary: rx concern   The patient would like to be contacted by a member of clinical staff when possible  The patient is concerned with their tirzepatide [facility]) 5 MG/0.5ML Pen [[id number]] prescription  Their weight has dropped from 140 to 102 lbs within the past year  Please contact when available        3rd attempt to contact patient #[phone]  to review sx of weight loss and medication . No answer, LVMTCB [phone].    Reason for Disposition  Third attempt to contact caller AND no contact made. Phone number verified.  Answer Assessment - Initial Assessment Questions N/A Unable to reach patient regarding questions with medication and weight loss.  Protocols used: No Contact or Duplicate Contact Call-A-AH

## 2023-02-01 NOTE — Telephone Encounter (Signed)
Inbound call from patient stating she has been sick for 2 weeks with chills, constant vomiting, and nausea.States she has been into a walk in clinic. Requesting a call back to be advise on steps to take. Please advise, thank you.

## 2023-02-01 NOTE — Telephone Encounter (Signed)
Message from [employer] sent at 02/01/2023 10:00 AM EDT  Summary: rx concern   The patient would like to be contacted by a member of clinical staff when possible  The patient is concerned with their tirzepatide [facility]) 5 MG/0.5ML Pen [[id number]] prescription  Their weight has dropped from 140 to 102 lbs within the past year  Please contact when available        Called pt and LM on VM to cal back to discuss.

## 2023-02-01 NOTE — Telephone Encounter (Signed)
Returned call to patient. Pt reports breaking her arm about 6 weeks ago, she is also recently being treated for UTI. She is on Gideon, and mentioned holding that starting tomorrow. Pt states that she has had chills, persistent nausea/vomiting for the past 2 weeks. Pt denies any fever, but her face feels warm and flushed. Pt is only able to drink about a cup of water a day, can't hardly keep food down. I advised pt with her current symptoms she will need ED evaluation. Not sure if patient has an ongoing infection, do not want this to continue for too long and lead to sepsis. They will need to draw labs and possibly order imaging. Pt verbalized understanding and had no concerns at the end of the call.

## 2023-02-01 NOTE — Telephone Encounter (Addendum)
Summary: rx concern   The patient would like to be contacted by a member of clinical staff when possible  The patient is concerned with their tirzepatide [facility]) 5 MG/0.5ML Pen [[id number]] prescription  Their weight has dropped from 140 to 102 lbs within the past year  Please contact when available     Attempted to call patient- left message to call office

## 2023-02-02 DIAGNOSIS — Z87898 Personal history of other specified conditions: Secondary | ICD-10-CM | POA: Insufficient documentation

## 2023-02-02 DIAGNOSIS — F1320 Sedative, hypnotic or anxiolytic dependence, uncomplicated: Secondary | ICD-10-CM | POA: Insufficient documentation

## 2023-02-02 NOTE — Patient Instructions (Signed)
Please call to schedule your mammogram and/or bone density: [facility]  Address: [address], [place], [place] [address] Phone: [phone]  [facility] [place] [address] [place],  [place]  [address] Phone: [phone]    Diabetes Mellitus Basics  Diabetes mellitus, or diabetes, is a long-term (chronic) disease. It occurs when the body does not properly use sugar (glucose) that is released from food after you eat. Diabetes mellitus may be caused by one or both of these problems: Your pancreas does not make enough of a hormone called insulin. Your body does not react in a normal way to the insulin that it makes. Insulin lets glucose enter cells in your body. This gives you energy. If you have diabetes, glucose cannot get into cells. This causes high blood glucose (hyperglycemia). How to treat and manage diabetes You may need to take insulin or other diabetes medicines daily to keep your glucose in balance. If you are prescribed insulin, you will learn how to give yourself insulin by injection. You may need to adjust the amount of insulin you take based on the foods that you eat. You will need to check your blood glucose levels using a glucose monitor as told by your health care provider. The readings can help determine if you have low or high blood glucose. Generally, you should have these blood glucose levels: Before meals (preprandial): 80-130 mg/dL (4.4-7.2 mmol/L). After meals (postprandial): below 180 mg/dL (10 mmol/L). Hemoglobin A1c (HbA1c) level: less than 7%. Your health care provider will set treatment goals for you. Keep all follow-up visits. This is important. Follow these instructions at home: Diabetes medicines Take your diabetes medicines every day as told by your health care provider. List your diabetes medicines here: Name of medicine: ______________________________ Amount (dose):  _______________ Time (a.m./p.m.): _______________ Notes: ___________________________________ Name of medicine: ______________________________ Amount (dose): _______________ Time (a.m./p.m.): _______________ Notes: ___________________________________ Name of medicine: ______________________________ Amount (dose): _______________ Time (a.m./p.m.): _______________ Notes: ___________________________________ Insulin If you use insulin, list the types of insulin you use here: Insulin type: ______________________________ Amount (dose): _______________ Time (a.m./p.m.): _______________Notes: ___________________________________ Insulin type: ______________________________ Amount (dose): _______________ Time (a.m./p.m.): _______________ Notes: ___________________________________ Insulin type: ______________________________ Amount (dose): _______________ Time (a.m./p.m.): _______________ Notes: ___________________________________ Insulin type: ______________________________ Amount (dose): _______________ Time (a.m./p.m.): _______________ Notes: ___________________________________ Insulin type: ______________________________ Amount (dose): _______________ Time (a.m./p.m.): _______________ Notes: ___________________________________ Managing blood glucose  Check your blood glucose levels using a glucose monitor as told by your health care provider. Write down the times that you check your glucose levels here: Time: _______________ Notes: ___________________________________ Time: _______________ Notes: ___________________________________ Time: _______________ Notes: ___________________________________ Time: _______________ Notes: ___________________________________ Time: _______________ Notes: ___________________________________ Time: _______________ Notes: ___________________________________  Low blood glucose Low blood glucose (hypoglycemia) is when glucose is at or below 70 mg/dL (3.9 mmol/L).  Symptoms may include: Feeling: Hungry. Sweaty and clammy. Irritable or easily upset. Dizzy. Sleepy. Having: A fast heartbeat. A headache. A change in your vision. Numbness around the mouth, lips, or tongue. Having trouble with: Moving (coordination). Sleeping. Treating low blood glucose To treat low blood glucose, eat or drink something containing sugar right away. If you can think clearly and swallow safely, follow the 15:15 rule: Take 15 grams of a fast-acting carb (carbohydrate), as told by your health care provider. Some fast-acting carbs are: Glucose tablets: take 3-4 tablets. Hard candy: eat 3-5 pieces. Fruit juice: drink 4 oz (120 mL). Regular (not diet) soda: drink 4-6 oz (120-180 mL).   Honey or sugar: eat 1 Tbsp (15 mL). Check your blood glucose levels 15 minutes after you take the carb. If your glucose is still at or below 70 mg/dL (3.9 mmol/L), take 15 grams of a carb again. If your glucose does not go above 70 mg/dL (3.9 mmol/L) after 3 tries, get help right away. After your glucose goes back to normal, eat a meal or a snack within 1 hour. Treating very low blood glucose If your glucose is at or below 54 mg/dL (3 mmol/L), you have very low blood glucose (severe hypoglycemia). This is an emergency. Do not wait to see if the symptoms will go away. Get medical help right away. Call your local emergency services (911 in the [place] Do not drive yourself to the hospital. Questions to ask your health care provider Should I talk with a diabetes educator? What equipment will I need to care for myself at home? What diabetes medicines do I need? When should I take them? How often do I need to check my blood glucose levels? What number can I call if I have questions? When is my follow-up visit? Where can I find a support group for people with diabetes? Where to find more information American Diabetes Association: www.diabetes.org Association of Diabetes Care and Education  Specialists: www.diabeteseducator.org Contact a health care provider if: Your blood glucose is at or above 240 mg/dL (13.3 mmol/L) for 2 days in a row. You have been sick or have had a fever for 2 days or more, and you are not getting better. You have any of these problems for more than 6 hours: You cannot eat or drink. You feel nauseous. You vomit. You have diarrhea. Get help right away if: Your blood glucose is lower than 54 mg/dL (3 mmol/L). You get confused. You have trouble thinking clearly. You have trouble breathing. These symptoms may represent a serious problem that is an emergency. Do not wait to see if the symptoms will go away. Get medical help right away. Call your local emergency services (911 in the [place] Do not drive yourself to the hospital. Summary Diabetes mellitus is a chronic disease that occurs when the body does not properly use sugar (glucose) that is released from food after you eat. Take insulin and diabetes medicines as told. Check your blood glucose every day, as often as told. Keep all follow-up visits. This is important. This information is not intended to replace advice given to you by your health care provider. Make sure you discuss any questions you have with your health care provider. Document Revised: 11/17/2019 Document Reviewed: 11/17/2019 Elsevier Patient Education  2024 [employer]  

## 2023-02-04 ENCOUNTER — Other Ambulatory Visit: Payer: Self-pay

## 2023-02-04 ENCOUNTER — Ambulatory Visit (INDEPENDENT_AMBULATORY_CARE_PROVIDER_SITE_OTHER): Payer: BC Managed Care – PPO | Admitting: Nurse Practitioner

## 2023-02-04 ENCOUNTER — Inpatient Hospital Stay (HOSPITAL_COMMUNITY)
Admission: EM | Admit: 2023-02-04 | Discharge: 2023-02-08 | DRG: 683 | Disposition: A | Discharge status: Home / Self Care | Payer: BC Managed Care – PPO | Attending: Internal Medicine | Admitting: Internal Medicine

## 2023-02-04 ENCOUNTER — Emergency Department (HOSPITAL_COMMUNITY): Payer: BC Managed Care – PPO

## 2023-02-04 ENCOUNTER — Encounter: Payer: Self-pay | Admitting: Nurse Practitioner

## 2023-02-04 ENCOUNTER — Encounter (HOSPITAL_COMMUNITY): Payer: Self-pay

## 2023-02-04 VITALS — BP 108/75 | HR 103 | Temp 98.1°F | Ht 60.0 in | Wt 109.8 lb

## 2023-02-04 DIAGNOSIS — K529 Noninfective gastroenteritis and colitis, unspecified: Secondary | ICD-10-CM | POA: Diagnosis present

## 2023-02-04 DIAGNOSIS — R109 Unspecified abdominal pain: Secondary | ICD-10-CM | POA: Diagnosis not present

## 2023-02-04 DIAGNOSIS — R112 Nausea with vomiting, unspecified: Secondary | ICD-10-CM | POA: Diagnosis present

## 2023-02-04 DIAGNOSIS — E785 Hyperlipidemia, unspecified: Secondary | ICD-10-CM | POA: Diagnosis present

## 2023-02-04 DIAGNOSIS — F329 Major depressive disorder, single episode, unspecified: Secondary | ICD-10-CM

## 2023-02-04 DIAGNOSIS — E860 Dehydration: Secondary | ICD-10-CM | POA: Diagnosis present

## 2023-02-04 DIAGNOSIS — Z87440 Personal history of urinary (tract) infections: Secondary | ICD-10-CM

## 2023-02-04 DIAGNOSIS — F411 Generalized anxiety disorder: Secondary | ICD-10-CM | POA: Diagnosis present

## 2023-02-04 DIAGNOSIS — Z5986 Financial insecurity: Secondary | ICD-10-CM

## 2023-02-04 DIAGNOSIS — E1159 Type 2 diabetes mellitus with other circulatory complications: Secondary | ICD-10-CM | POA: Diagnosis present

## 2023-02-04 DIAGNOSIS — R634 Abnormal weight loss: Secondary | ICD-10-CM | POA: Diagnosis not present

## 2023-02-04 DIAGNOSIS — N3010 Interstitial cystitis (chronic) without hematuria: Secondary | ICD-10-CM | POA: Diagnosis present

## 2023-02-04 DIAGNOSIS — R399 Unspecified symptoms and signs involving the genitourinary system: Secondary | ICD-10-CM | POA: Diagnosis not present

## 2023-02-04 DIAGNOSIS — Z823 Family history of stroke: Secondary | ICD-10-CM

## 2023-02-04 DIAGNOSIS — Z881 Allergy status to other antibiotic agents status: Secondary | ICD-10-CM | POA: Diagnosis not present

## 2023-02-04 DIAGNOSIS — Z780 Asymptomatic menopausal state: Secondary | ICD-10-CM

## 2023-02-04 DIAGNOSIS — G2581 Restless legs syndrome: Secondary | ICD-10-CM | POA: Diagnosis not present

## 2023-02-04 DIAGNOSIS — Z91148 Patient's other noncompliance with medication regimen for other reason: Secondary | ICD-10-CM

## 2023-02-04 DIAGNOSIS — Z9181 History of falling: Secondary | ICD-10-CM

## 2023-02-04 DIAGNOSIS — F419 Anxiety disorder, unspecified: Secondary | ICD-10-CM | POA: Diagnosis present

## 2023-02-04 DIAGNOSIS — Z882 Allergy status to sulfonamides status: Secondary | ICD-10-CM

## 2023-02-04 DIAGNOSIS — Z79899 Other long term (current) drug therapy: Secondary | ICD-10-CM

## 2023-02-04 DIAGNOSIS — Z9884 Bariatric surgery status: Secondary | ICD-10-CM

## 2023-02-04 DIAGNOSIS — Z7982 Long term (current) use of aspirin: Secondary | ICD-10-CM

## 2023-02-04 DIAGNOSIS — K5900 Constipation, unspecified: Secondary | ICD-10-CM | POA: Diagnosis present

## 2023-02-04 DIAGNOSIS — E876 Hypokalemia: Secondary | ICD-10-CM | POA: Diagnosis not present

## 2023-02-04 DIAGNOSIS — R200 Anesthesia of skin: Secondary | ICD-10-CM | POA: Diagnosis present

## 2023-02-04 DIAGNOSIS — E1165 Type 2 diabetes mellitus with hyperglycemia: Secondary | ICD-10-CM

## 2023-02-04 DIAGNOSIS — R6883 Chills (without fever): Secondary | ICD-10-CM | POA: Diagnosis not present

## 2023-02-04 DIAGNOSIS — R1110 Vomiting, unspecified: Secondary | ICD-10-CM | POA: Diagnosis not present

## 2023-02-04 DIAGNOSIS — N12 Tubulo-interstitial nephritis, not specified as acute or chronic: Principal | ICD-10-CM

## 2023-02-04 DIAGNOSIS — W19XXXA Unspecified fall, initial encounter: Secondary | ICD-10-CM | POA: Diagnosis present

## 2023-02-04 DIAGNOSIS — E871 Hypo-osmolality and hyponatremia: Secondary | ICD-10-CM | POA: Diagnosis present

## 2023-02-04 DIAGNOSIS — K279 Peptic ulcer, site unspecified, unspecified as acute or chronic, without hemorrhage or perforation: Secondary | ICD-10-CM | POA: Diagnosis present

## 2023-02-04 DIAGNOSIS — R413 Other amnesia: Secondary | ICD-10-CM | POA: Diagnosis present

## 2023-02-04 DIAGNOSIS — E119 Type 2 diabetes mellitus without complications: Secondary | ICD-10-CM | POA: Diagnosis not present

## 2023-02-04 DIAGNOSIS — Z8673 Personal history of transient ischemic attack (TIA), and cerebral infarction without residual deficits: Secondary | ICD-10-CM | POA: Diagnosis not present

## 2023-02-04 DIAGNOSIS — I5032 Chronic diastolic (congestive) heart failure: Secondary | ICD-10-CM | POA: Diagnosis not present

## 2023-02-04 DIAGNOSIS — K311 Adult hypertrophic pyloric stenosis: Secondary | ICD-10-CM | POA: Diagnosis present

## 2023-02-04 DIAGNOSIS — I152 Hypertension secondary to endocrine disorders: Secondary | ICD-10-CM

## 2023-02-04 DIAGNOSIS — Z800 Family history of malignant neoplasm of digestive organs: Secondary | ICD-10-CM

## 2023-02-04 DIAGNOSIS — R1310 Dysphagia, unspecified: Secondary | ICD-10-CM

## 2023-02-04 DIAGNOSIS — R531 Weakness: Secondary | ICD-10-CM | POA: Diagnosis not present

## 2023-02-04 DIAGNOSIS — R519 Headache, unspecified: Secondary | ICD-10-CM | POA: Diagnosis present

## 2023-02-04 DIAGNOSIS — R000 Tachycardia, unspecified: Secondary | ICD-10-CM | POA: Diagnosis not present

## 2023-02-04 DIAGNOSIS — I672 Cerebral atherosclerosis: Secondary | ICD-10-CM | POA: Diagnosis not present

## 2023-02-04 DIAGNOSIS — I509 Heart failure, unspecified: Secondary | ICD-10-CM | POA: Diagnosis not present

## 2023-02-04 DIAGNOSIS — N179 Acute kidney failure, unspecified: Secondary | ICD-10-CM | POA: Diagnosis not present

## 2023-02-04 DIAGNOSIS — R8281 Pyuria: Secondary | ICD-10-CM | POA: Diagnosis not present

## 2023-02-04 DIAGNOSIS — Z888 Allergy status to other drugs, medicaments and biological substances status: Secondary | ICD-10-CM

## 2023-02-04 DIAGNOSIS — I110 Hypertensive heart disease with heart failure: Secondary | ICD-10-CM | POA: Diagnosis present

## 2023-02-04 DIAGNOSIS — K5100 Ulcerative (chronic) pancolitis without complications: Secondary | ICD-10-CM

## 2023-02-04 DIAGNOSIS — S42202A Unspecified fracture of upper end of left humerus, initial encounter for closed fracture: Secondary | ICD-10-CM | POA: Diagnosis not present

## 2023-02-04 DIAGNOSIS — F29 Unspecified psychosis not due to a substance or known physiological condition: Secondary | ICD-10-CM | POA: Diagnosis present

## 2023-02-04 DIAGNOSIS — K921 Melena: Secondary | ICD-10-CM | POA: Diagnosis not present

## 2023-02-04 DIAGNOSIS — Z87891 Personal history of nicotine dependence: Secondary | ICD-10-CM

## 2023-02-04 DIAGNOSIS — Z7985 Long-term (current) use of injectable non-insulin antidiabetic drugs: Secondary | ICD-10-CM

## 2023-02-04 DIAGNOSIS — N390 Urinary tract infection, site not specified: Secondary | ICD-10-CM

## 2023-02-04 DIAGNOSIS — N200 Calculus of kidney: Secondary | ICD-10-CM | POA: Diagnosis not present

## 2023-02-04 DIAGNOSIS — F410 Panic disorder [episodic paroxysmal anxiety] without agoraphobia: Secondary | ICD-10-CM | POA: Diagnosis present

## 2023-02-04 DIAGNOSIS — F319 Bipolar disorder, unspecified: Secondary | ICD-10-CM | POA: Diagnosis present

## 2023-02-04 DIAGNOSIS — E538 Deficiency of other specified B group vitamins: Secondary | ICD-10-CM | POA: Diagnosis not present

## 2023-02-04 DIAGNOSIS — Z6821 Body mass index (BMI) 21.0-21.9, adult: Secondary | ICD-10-CM

## 2023-02-04 DIAGNOSIS — R748 Abnormal levels of other serum enzymes: Secondary | ICD-10-CM | POA: Diagnosis not present

## 2023-02-04 DIAGNOSIS — Z7902 Long term (current) use of antithrombotics/antiplatelets: Secondary | ICD-10-CM

## 2023-02-04 DIAGNOSIS — E781 Pure hyperglyceridemia: Secondary | ICD-10-CM | POA: Diagnosis not present

## 2023-02-04 DIAGNOSIS — Z87442 Personal history of urinary calculi: Secondary | ICD-10-CM

## 2023-02-04 DIAGNOSIS — R21 Rash and other nonspecific skin eruption: Secondary | ICD-10-CM | POA: Diagnosis present

## 2023-02-04 DIAGNOSIS — Z8249 Family history of ischemic heart disease and other diseases of the circulatory system: Secondary | ICD-10-CM

## 2023-02-04 DIAGNOSIS — I5033 Acute on chronic diastolic (congestive) heart failure: Secondary | ICD-10-CM | POA: Diagnosis present

## 2023-02-04 DIAGNOSIS — K5090 Crohn's disease, unspecified, without complications: Secondary | ICD-10-CM | POA: Diagnosis present

## 2023-02-04 DIAGNOSIS — K5730 Diverticulosis of large intestine without perforation or abscess without bleeding: Secondary | ICD-10-CM | POA: Diagnosis not present

## 2023-02-04 DIAGNOSIS — Z91199 Patient's noncompliance with other medical treatment and regimen due to unspecified reason: Secondary | ICD-10-CM

## 2023-02-04 LAB — CBC WITH DIFFERENTIAL/PLATELET
Hematocrit: 39.2 % (ref 34.0–46.6)
Hemoglobin: 13.2 g/dL (ref 11.1–15.9)
Lymphocytes Absolute: 2.5 x10E3/uL (ref 0.7–3.1)
Lymphs: 28 %
MCH: 29.2 pg (ref 26.6–33.0)
MCHC: 33.7 g/dL (ref 31.5–35.7)
MCV: 87 fL (ref 79–97)
MID (Absolute): 0.4 X10E3/uL (ref 0.1–1.6)
MID: 4 %
Neutrophils Absolute: 6.1 x10E3/uL (ref 1.4–7.0)
Neutrophils: 68 %
Platelets: 352 x10E3/uL (ref 150–450)
RBC: 4.52 x10E6/uL (ref 3.77–5.28)
RDW: 14.2 % (ref 11.7–15.4)
WBC: 9 x10E3/uL (ref 3.4–10.8)

## 2023-02-04 LAB — MICROSCOPIC EXAMINATION: Bacteria, UA: NONE SEEN

## 2023-02-04 LAB — URINALYSIS, ROUTINE W REFLEX MICROSCOPIC
Bilirubin Urine: NEGATIVE
Bilirubin, UA: NEGATIVE
Glucose, UA: NEGATIVE
Glucose, UA: NEGATIVE mg/dL
Ketones, UA: NEGATIVE
Ketones, ur: NEGATIVE mg/dL
Nitrite, UA: NEGATIVE
Nitrite: NEGATIVE
Protein, ur: NEGATIVE mg/dL
Protein,UA: NEGATIVE
Specific Gravity, UA: 1.01 (ref 1.005–1.030)
Specific Gravity, Urine: 1.006 (ref 1.005–1.030)
Urobilinogen, Ur: 0.2 mg/dL (ref 0.2–1.0)
pH, UA: 5.5 (ref 5.0–7.5)
pH: 5 (ref 5.0–8.0)

## 2023-02-04 LAB — COMPREHENSIVE METABOLIC PANEL
ALT: 24 U/L (ref 0–44)
AST: 31 U/L (ref 15–41)
Albumin: 3 g/dL — ABNORMAL LOW (ref 3.5–5.0)
Alkaline Phosphatase: 205 U/L — ABNORMAL HIGH (ref 38–126)
Anion gap: 12 (ref 5–15)
BUN: 18 mg/dL (ref 8–23)
CO2: 21 mmol/L — ABNORMAL LOW (ref 22–32)
Calcium: 8.6 mg/dL — ABNORMAL LOW (ref 8.9–10.3)
Chloride: 99 mmol/L (ref 98–111)
Creatinine, Ser: 1.34 mg/dL — ABNORMAL HIGH (ref 0.44–1.00)
GFR, Estimated: 44 mL/min — ABNORMAL LOW (ref >60)
Glucose, Bld: 159 mg/dL — ABNORMAL HIGH (ref 70–99)
Potassium: 3.8 mmol/L (ref 3.5–5.1)
Sodium: 132 mmol/L — ABNORMAL LOW (ref 135–145)
Total Bilirubin: 0.4 mg/dL (ref 0.3–1.2)
Total Protein: 6.5 g/dL (ref 6.5–8.1)

## 2023-02-04 LAB — CBC
HCT: 39.5 % (ref 36.0–46.0)
Hemoglobin: 12.5 g/dL (ref 12.0–15.0)
MCH: 28 pg (ref 26.0–34.0)
MCHC: 31.6 g/dL (ref 30.0–36.0)
MCV: 88.6 fL (ref 80.0–100.0)
Platelets: 346 10*3/uL (ref 150–400)
RBC: 4.46 MIL/uL (ref 3.87–5.11)
RDW: 13.8 % (ref 11.5–15.5)
WBC: 8.2 10*3/uL (ref 4.0–10.5)
nRBC: 0 % (ref 0.0–0.2)

## 2023-02-04 LAB — CBG MONITORING, ED: Glucose-Capillary: 158 mg/dL — ABNORMAL HIGH (ref 70–99)

## 2023-02-04 LAB — LIPASE, BLOOD: Lipase: 32 U/L (ref 11–51)

## 2023-02-04 LAB — BAYER DCA HB A1C WAIVED: HB A1C (BAYER DCA - WAIVED): 6.5 % — ABNORMAL HIGH (ref 4.8–5.6)

## 2023-02-04 MED ORDER — ONDANSETRON 4 MG PO TBDP
4.0000 mg | ORAL_TABLET | Freq: Once | ORAL | Status: AC
  Administered 2023-02-04: 4 mg via ORAL
  Filled 2023-02-04: qty 1

## 2023-02-04 NOTE — Telephone Encounter (Signed)
Patient is calling to inform that she was seen by her PCP and they also recommended for her to go to the ED so she is on her way to [facility]

## 2023-02-04 NOTE — Progress Notes (Signed)
BP 108/75   Pulse (!) 103   Temp 98.1 F (36.7 C) (Oral)   Ht 5' (1.524 m)   Wt 109 lb 12.8 oz (49.8 kg)   LMP  (LMP Unknown)   SpO2 95%   BMI 21.44 kg/m    Subjective:    Patient ID: Amy Hansen, female    DOB: [birth year], 67 y.o.   MRN: [id number]  HPI: Amy Hansen is a 67 y.o. female  Chief Complaint  Patient presents with   Chills    For past 3 weeks wast treated 2 weeks ago for UTI and cloudy urine. Patient feels like she is on fire and does not have a temp   Vomiting    Patient relates vomiting to taking Mounjaro so she stopped taking.    DIABETES Last A1c 6.1% January.  Has stopped Mounjaro as made her feel bad, no dose this week -- significant weight loss and abdominal discomfort.  Has been throwing up off and on for one year, has stopped all oral medications recently as was taking and throwing up -- this became worse after she broke her arm May 24th and was taking Norco and Tizanidine.  She is followed by Dr. Adela Lank for GI, who recently told her to go to ER due to her symptoms and underlying pancolitis (takes [facility]) -- feels feverish, weakness, unable to keep fluid/food down, hypotension, N&V -- BP at home sitting 164/93 and standing 92/59, done by sister who is [employer] fundraiser.  Took oral medications this morning and did not have emesis.  Continues to have nausea.   Had UTI treated on 01/22/23.  She is passing bowels, had BM last night without difficulty.  Symptoms have been present for 3 weeks, but recently worsening with more nausea and unable to keep things down.   Hypoglycemic episodes:no Polydipsia/polyuria: no Visual disturbance: no Chest pain: no Paresthesias: no Glucose Monitoring: yes  Accucheck frequency: once a week  Fasting glucose:  Post prandial:  Evening:  Before meals: Taking Insulin?: no  Long acting insulin:  Short acting insulin: Blood Pressure Monitoring: not checking Retinal Examination: Up to Date -- Dr. Patsi Sears Exam: Up to  Date Pneumovax: Up to Date Influenza: Up to Date Aspirin: no   HYPERTENSION / HYPERLIPIDEMIA/HF Currently has Lasix to take and Rosuvastatin. Satisfied with current treatment? yes Duration of hypertension: chronic BP monitoring frequency: rarely BP range:  BP medication side effects: no Duration of hyperlipidemia: chronic Cholesterol medication side effects: no Cholesterol supplements: none Medication compliance: fair compliance Aspirin: no Recent stressors: yes Recurrent headaches: no Visual changes: no Palpitations: no Dyspnea: yes occasional recently Chest pain: no Lower extremity edema: no Dizzy/lightheaded: yes   URINARY SYMPTOMS Was treated weeks ago for UTI.   Dysuria: burning Urinary frequency: yes Urgency: yes Small volume voids: yes Symptom severity: yes Urinary incontinence: wears Attends at night at baseline due to bowels Foul odor: no Hematuria: no Abdominal pain: no Back pain: yes Suprapubic pain/pressure: yes Flank pain: no Fever:  subjective Vomiting: yes Status: stable Previous urinary tract infection: yes Recurrent urinary tract infection: no Sexual activity: No sexually active History of sexually transmitted disease: no Treatments attempted: antibiotics and increasing fluids    DEPRESSION Has Seroquel, Wellbutrin, Lamictal to take at home.  She visits [facility] and they are weaning her off Xanax -- is tolerating this she reports.  No longer taking Vitamin D or B12 at home.  Could not tolerate. Mood status: stable Satisfied with current treatment?: yes Symptom  severity: moderate  Duration of current treatment : chronic Side effects: no Medication compliance: good compliance Psychotherapy/counseling: yes in the past Depressed mood: yes Anxious mood: yes Anhedonia: yes Significant weight loss or gain: no Insomnia: yes hard to stay asleep Fatigue: yes Feelings of worthlessness or guilt: no Impaired  concentration/indecisiveness: no Suicidal ideations: no Hopelessness: no Crying spells: yes    01/04/2023    2:54 PM 12/31/2022    2:13 PM 07/18/2022    8:48 AM 01/18/2022    3:24 PM 08/01/2021    9:34 AM  Depression screen PHQ 2/9  Decreased Interest 2 2 1 2  0  Down, Depressed, Hopeless 3 3 1 2  0  PHQ - 2 Score 5 5 2 4  0  Altered sleeping 3 3 3 2 1   Tired, decreased energy 2 2 3 3 3   Change in appetite 3 3 1 3 1   Feeling bad or failure about yourself  3 3 0 3 0  Trouble concentrating 3 3 3 3 3   Moving slowly or fidgety/restless 0 3 1 3 1   Suicidal thoughts 2 3 0 1 0  PHQ-9 Score 21 25 13 22 9   Difficult doing work/chores Extremely dIfficult Extremely dIfficult Somewhat difficult Extremely dIfficult Not difficult at all       12/31/2022    2:15 PM 01/18/2022    3:25 PM 08/01/2021    9:36 AM 05/06/2020    1:26 PM  GAD 7 : Generalized Anxiety Score  Nervous, Anxious, on Edge 3 3 3 3   Control/stop worrying 3 2 3 3   Worry too much - different things 3 3 3 3   Trouble relaxing 3 2 3 2   Restless 3 3 3  0  Easily annoyed or irritable 3 3 1 2   Afraid - awful might happen 3 3 1 3   Total GAD 7 Score 21 19 17 16   Anxiety Difficulty Extremely difficult Very difficult Somewhat difficult Not difficult at all   Relevant past medical, surgical, family and social history reviewed and updated as indicated. Interim medical history since our last visit reviewed. Allergies and medications reviewed and updated.  Review of Systems  Constitutional:  Positive for appetite change, chills and fatigue. Negative for activity change, diaphoresis, fever and unexpected weight change.  Respiratory:  Positive for shortness of breath (occasional at baseline). Negative for cough, chest tightness and wheezing.   Cardiovascular:  Negative for chest pain, palpitations and leg swelling.  Gastrointestinal:  Positive for nausea and vomiting. Negative for abdominal distention, abdominal pain, blood in stool, constipation,  diarrhea and rectal pain.  Endocrine: Negative for cold intolerance, heat intolerance, polydipsia, polyphagia and polyuria.  Neurological:  Positive for dizziness and weakness. Negative for tremors, speech difficulty and headaches.  Psychiatric/Behavioral:  Positive for sleep disturbance. Negative for decreased concentration, self-injury and suicidal ideas. The patient is nervous/anxious.     Per HPI unless specifically indicated above     Objective:    BP 108/75   Pulse (!) 103   Temp 98.1 F (36.7 C) (Oral)   Ht 5' (1.524 m)   Wt 109 lb 12.8 oz (49.8 kg)   LMP  (LMP Unknown)   SpO2 95%   BMI 21.44 kg/m   Wt Readings from Last 3 Encounters:  02/04/23 109 lb (49.4 kg)  02/04/23 109 lb 12.8 oz (49.8 kg)  01/17/23 117 lb 12.8 oz (53.4 kg)    Physical Exam Vitals and nursing note reviewed.  Constitutional:      General: She is awake. She  is not in acute distress.    Appearance: She is well-developed, well-groomed and underweight. She is not ill-appearing or toxic-appearing.  HENT:     Head: Normocephalic.     Right Ear: Hearing and external ear normal.     Left Ear: Hearing and external ear normal.  Eyes:     General: Lids are normal.        Right eye: No discharge.        Left eye: No discharge.     Conjunctiva/sclera: Conjunctivae normal.     Pupils: Pupils are equal, round, and reactive to light.  Neck:     Thyroid: No thyromegaly.     Vascular: No carotid bruit.  Cardiovascular:     Rate and Rhythm: Regular rhythm. Tachycardia present.     Heart sounds: Normal heart sounds. No murmur heard.    No gallop.     Comments: Heart rate on auscultation 123 with moving from chair to exam table.  Regular. Pulmonary:     Effort: Pulmonary effort is normal. No accessory muscle usage or respiratory distress.     Breath sounds: Normal breath sounds. No decreased breath sounds, wheezing or rhonchi.  Abdominal:     General: Bowel sounds are normal. There is no distension.      Palpations: Abdomen is soft.     Tenderness: There is generalized abdominal tenderness. There is no right CVA tenderness or left CVA tenderness.     Comments: Overall tenderness on exam to all quadrants, worse to LUQ.  Musculoskeletal:     Cervical back: Normal range of motion and neck supple.     Right lower leg: No edema.     Left lower leg: No edema.  Lymphadenopathy:     Cervical: No cervical adenopathy.  Skin:    General: Skin is warm and dry.  Neurological:     Mental Status: She is alert and oriented to person, place, and time.     Deep Tendon Reflexes: Reflexes are normal and symmetric.     Reflex Scores:      Brachioradialis reflexes are 2+ on the right side and 2+ on the left side.      Patellar reflexes are 2+ on the right side and 2+ on the left side. Psychiatric:        Attention and Perception: Attention normal.        Mood and Affect: Mood normal.        Speech: Speech normal.        Behavior: Behavior normal. Behavior is cooperative.        Thought Content: Thought content normal.     Results for orders placed or performed in visit on 02/04/23  Microscopic Examination   Urine  Result Value Ref Range   WBC, UA 6-10 (A) 0 - 5 /hpf   RBC, Urine 0-2 0 - 2 /hpf   Epithelial Cells (non renal) 0-10 0 - 10 /hpf   Casts Present (A) None seen /lpf   Cast Type Hyaline casts N/A   Bacteria, UA None seen None seen/Few  Bayer DCA Hb A1c Waived  Result Value Ref Range   HB A1C (BAYER DCA - WAIVED) 6.5 (H) 4.8 - 5.6 %  CBC With Differential/Platelet  Result Value Ref Range   WBC 9.0 3.4 - 10.8 x10E3/uL   RBC 4.52 3.77 - 5.28 x10E6/uL   Hemoglobin 13.2 11.1 - 15.9 g/dL   Hematocrit 16.1 09.6 - 46.6 %   MCV 87 79 - 97  fL   MCH 29.2 26.6 - 33.0 pg   MCHC 33.7 31.5 - 35.7 g/dL   RDW 40.9 81.1 - 91.4 %   Platelets 352 150 - 450 x10E3/uL   Neutrophils 68 Not Estab. %   Lymphs 28 Not Estab. %   MID 4 Not Estab. %   Neutrophils Absolute 6.1 1.4 - 7.0 x10E3/uL    Lymphocytes Absolute 2.5 0.7 - 3.1 x10E3/uL   MID (Absolute) 0.4 0.1 - 1.6 X10E3/uL  Urinalysis, Routine w reflex microscopic  Result Value Ref Range   Specific Gravity, UA 1.010 1.005 - 1.030   pH, UA 5.5 5.0 - 7.5   Color, UA Yellow Yellow   Appearance Ur Cloudy (A) Clear   Leukocytes,UA 1+ (A) Negative   Protein,UA Negative Negative/Trace   Glucose, UA Negative Negative   Ketones, UA Negative Negative   RBC, UA Trace (A) Negative   Bilirubin, UA Negative Negative   Urobilinogen, Ur 0.2 0.2 - 1.0 mg/dL   Nitrite, UA Negative Negative   Microscopic Examination See below:       Assessment & Plan:   Problem List Items Addressed This Visit       Cardiovascular and Mediastinum   Chronic heart failure with preserved ejection fraction (HCC)    Chronic, stable.  Euvolemic.  Continue medication regimen. - Reminded to call for an overnight weight gain of >2 pounds or a weekly weight gain of >5 pounds - not adding salt to food and read food labels. Reviewed the importance of keeping daily sodium intake to 2000mg  daily.  - Avoid Ibuprofen products.      Relevant Orders   CBC With Differential/Platelet (Completed)   Hypertension associated with type 2 diabetes mellitus (HCC)    Chronic with levels on lower side recently and some orthostatic changes, suspected related to dehydration due to current N&V.  Recommend to hold Lasix.  Check BP 2 times daily a home and document for visits.  DASH diet at home.  LABS: CBC and CMP.  Plan for return in 2 weeks for recheck of BP.        Relevant Orders   Bayer DCA Hb A1c Waived (Completed)   Comprehensive metabolic panel     Digestive   Pancolitis (HCC)    Chronic, ongoing.  Followed by GI, continue this collaboration.  Continue medication regimen ordered by them.  Have recommended at this time to visit ER for further work-up of her N&V over past 3 weeks + chills.  ?pancreatitis related to [facility].  Labs obtained today.  CBC reassuring in  office.  Concern for dehydration with her lower BP and elevation in HR + recent UTI and overall acute illness, would benefit ER visit for further work-up and possible IV hydration.          Endocrine   Poorly controlled type 2 diabetes mellitus (HCC) - Primary    Chronic, stable with A1c at 6.5% today.  She stopped Mounjaro due to further weight loss and increased nausea, ?pancreatitis -- check labs today.  Urine protein negative on recent UA checks.  At this time: - Remain off Mounjaro -- plan for recheck of A1c in 3 months and if trend up consider SGLT2 if has not had trial of this.  Check Amylase and Lipase today as not tolerating Mounjaro. - Recommend heavy focus on diet and regular activity daily. - Check BS 2-3 times daily and document for provider visits.   - No ACE or ARB on board, could consider  in future, will hold off at this time due to orthostatic BP levels.  Maintain Statin. - Eye and foot exams up to date.      Relevant Orders   Bayer DCA Hb A1c Waived (Completed)   Amylase   Lipase     Genitourinary   Recurrent UTI    Ongoing issue, with recent abx treatment -- Augmentin.  UA performed today overall stable and CBC reassuring.  Will send for culture to further assess.  Have recommended at this time to visit ER for further work-up of her N&V over past 3 weeks + chills.  ?pancreatitis related to [facility] vs urinary issues.  Labs obtained today.  Concern for dehydration with her lower BP and elevation in HR + recent UTI and overall acute illness, would benefit ER visit for further work-up and possible IV hydration.        Relevant Orders   CBC With Differential/Platelet (Completed)   Urinalysis, Routine w reflex microscopic (Completed)     Other   B12 deficiency    Chronic, stable.  Continue supplement daily.  Check level today.      Relevant Orders   Vitamin B12   History of TIA (transient ischemic attack)    Stable, followed by neurology.  Continue to monitor with  goals A1c <6.5%, LDL <55, and BP <130/80.  Educated patient on this.      Hypertriglyceridemia    Chronic, ongoing.  Continue Rosuvastatin as ordered.  Lipid panel obtained today.      Relevant Orders   Lipid Panel w/o Chol/HDL Ratio   Major depression, chronic    Chronic, ongoing.  Followed by psychiatry, who is currently weaning her off benzo.  Continue this collaboration and medication regimen as ordered by them.  Denies SI/HI.      Other Visit Diagnoses     Postmenopausal estrogen deficiency       DEXA ordered today and discussed with patient.   Relevant Orders   DG Bone Density   Pyuria       Urine sent for culture.   Relevant Orders   Urine Culture        Follow up plan: Return in about 2 weeks (around 02/18/2023) for ABDOMINAL PAIN.

## 2023-02-04 NOTE — Assessment & Plan Note (Signed)
Chronic, stable with A1c at 6.5% today.  She stopped Mounjaro due to further weight loss and increased nausea, ?pancreatitis -- check labs today.  Urine protein negative on recent UA checks.  At this time: - Remain off Mounjaro -- plan for recheck of A1c in 3 months and if trend up consider SGLT2 if has not had trial of this.  Check Amylase and Lipase today as not tolerating Mounjaro. - Recommend heavy focus on diet and regular activity daily. - Check BS 2-3 times daily and document for provider visits.   - No ACE or ARB on board, could consider in future, will hold off at this time due to orthostatic BP levels.  Maintain Statin. - Eye and foot exams up to date.

## 2023-02-04 NOTE — Assessment & Plan Note (Signed)
Chronic with levels on lower side recently and some orthostatic changes, suspected related to dehydration due to current N&V.  Recommend to hold Lasix.  Check BP 2 times daily a home and document for visits.  DASH diet at home.  LABS: CBC and CMP.  Plan for return in 2 weeks for recheck of BP.

## 2023-02-04 NOTE — Assessment & Plan Note (Signed)
Ongoing issue, with recent abx treatment -- Augmentin.  UA performed today overall stable and CBC reassuring.  Will send for culture to further assess.  Have recommended at this time to visit ER for further work-up of her N&V over past 3 weeks + chills.  ?pancreatitis related to [facility] vs urinary issues.  Labs obtained today.  Concern for dehydration with her lower BP and elevation in HR + recent UTI and overall acute illness, would benefit ER visit for further work-up and possible IV hydration.

## 2023-02-04 NOTE — Assessment & Plan Note (Signed)
Chronic, ongoing.  Continue Rosuvastatin as ordered.  Lipid panel obtained today.

## 2023-02-04 NOTE — ED Triage Notes (Signed)
Pt came to ED for vomiting and chills for 3 weeks. Pt denies fever. Pt went to PCP and states they are concerned for pancreatitis. Pt states abd tender, hx of chron's and colitis. Axox4.

## 2023-02-04 NOTE — Assessment & Plan Note (Signed)
Chronic, stable.  Euvolemic.  Continue medication regimen. - Reminded to call for an overnight weight gain of >2 pounds or a weekly weight gain of >5 pounds - not adding salt to food and read food labels. Reviewed the importance of keeping daily sodium intake to 2000mg  daily.  - Avoid Ibuprofen products.

## 2023-02-04 NOTE — Assessment & Plan Note (Signed)
Chronic, stable.  Continue supplement daily.  Check level today.

## 2023-02-04 NOTE — Telephone Encounter (Signed)
Noted, thanks!

## 2023-02-04 NOTE — Assessment & Plan Note (Signed)
Chronic, ongoing.  Followed by psychiatry, who is currently weaning her off benzo.  Continue this collaboration and medication regimen as ordered by them.  Denies SI/HI.

## 2023-02-04 NOTE — Assessment & Plan Note (Signed)
Stable, followed by neurology.  Continue to monitor with goals A1c <6.5%, LDL <55, and BP <130/80.  Educated patient on this.

## 2023-02-04 NOTE — ED Provider Triage Note (Signed)
Emergency Medicine Provider Triage Evaluation Note  Amy Hansen , a 67 y.o. female  was evaluated in triage.  Pt complains of nausea, vomiting, cold chills for the last few days. Recently treated with antibiotics for UTI and has felt just very poor since then.  Review of Systems  Positive: N/V Negative:  diarrhea  Physical Exam  BP 139/83 (BP Location: Right Arm)   Pulse (!) 112   Temp 98.3 F (36.8 C) (Oral)   Resp 18   Ht 5' (1.524 m)   Wt 49.4 kg   LMP  (LMP Unknown)   SpO2 99%   BMI 21.29 kg/m  Gen:   Awake, no distress   Resp:  Normal effort  MSK:   Moves extremities without difficulty  Other:  Diffuse abd pain  Medical Decision Making  Medically screening exam initiated at 5:45 PM.  Appropriate orders placed.  Amy Hansen was informed that the remainder of the evaluation will be completed by another provider, this initial triage assessment does not replace that evaluation, and the importance of remaining in the ED until their evaluation is complete.     Pete Pelt, [place] 02/04/23 1757

## 2023-02-04 NOTE — Assessment & Plan Note (Addendum)
Chronic, ongoing.  Followed by GI, continue this collaboration.  Continue medication regimen ordered by them.  Have recommended at this time to visit ER for further work-up of her N&V over past 3 weeks + chills.  ?pancreatitis related to [facility].  Labs obtained today.  CBC reassuring in office.  Concern for dehydration with her lower BP and elevation in HR + recent UTI and overall acute illness, would benefit ER visit for further work-up and possible IV hydration.

## 2023-02-04 NOTE — Assessment & Plan Note (Signed)
>>  ASSESSMENT AND PLAN FOR DIABETES MELLITUS TYPE 2 IN NONOBESE (HCC) WRITTEN ON 02/04/2023  6:37 PM BY CANNADY, JOLENE T, NP  Chronic, stable with A1c at 6.5% today.  She stopped Mounjaro due to further weight loss and increased nausea, ?pancreatitis -- check labs today.  Urine protein negative on recent UA checks.  At this time: - Remain off Mounjaro -- plan for recheck of A1c in 3 months and if trend up consider SGLT2 if has not had trial of this.  Check Amylase and Lipase today as not tolerating Mounjaro. - Recommend heavy focus on diet and regular activity daily. - Check BS 2-3 times daily and document for provider visits.   - No ACE or ARB on board, could consider in future, will hold off at this time due to orthostatic BP levels.  Maintain Statin. - Eye and foot exams up to date.

## 2023-02-05 ENCOUNTER — Emergency Department (HOSPITAL_COMMUNITY): Payer: BC Managed Care – PPO

## 2023-02-05 ENCOUNTER — Encounter (HOSPITAL_COMMUNITY): Payer: Self-pay | Admitting: Internal Medicine

## 2023-02-05 ENCOUNTER — Telehealth: Payer: Self-pay | Admitting: Gastroenterology

## 2023-02-05 DIAGNOSIS — N179 Acute kidney failure, unspecified: Principal | ICD-10-CM

## 2023-02-05 DIAGNOSIS — F319 Bipolar disorder, unspecified: Secondary | ICD-10-CM | POA: Diagnosis not present

## 2023-02-05 DIAGNOSIS — F411 Generalized anxiety disorder: Secondary | ICD-10-CM | POA: Diagnosis not present

## 2023-02-05 DIAGNOSIS — R1310 Dysphagia, unspecified: Secondary | ICD-10-CM

## 2023-02-05 DIAGNOSIS — Z91148 Patient's other noncompliance with medication regimen for other reason: Secondary | ICD-10-CM | POA: Diagnosis not present

## 2023-02-05 DIAGNOSIS — F419 Anxiety disorder, unspecified: Secondary | ICD-10-CM | POA: Diagnosis present

## 2023-02-05 HISTORY — DX: Acute kidney failure, unspecified: N17.9

## 2023-02-05 LAB — CK: Total CK: 72 U/L (ref 38–234)

## 2023-02-05 LAB — COMPREHENSIVE METABOLIC PANEL
ALT: 22 IU/L (ref 0–32)
AST: 29 IU/L (ref 0–40)
Albumin: 3.4 g/dL — ABNORMAL LOW (ref 3.9–4.9)
Alkaline Phosphatase: 263 IU/L — ABNORMAL HIGH (ref 44–121)
BUN/Creatinine Ratio: 14 (ref 12–28)
BUN: 19 mg/dL (ref 8–27)
Bilirubin Total: 0.2 mg/dL (ref 0.0–1.2)
CO2: 20 mmol/L (ref 20–29)
Calcium: 8.6 mg/dL — ABNORMAL LOW (ref 8.7–10.3)
Chloride: 98 mmol/L (ref 96–106)
Creatinine, Ser: 1.38 mg/dL — ABNORMAL HIGH (ref 0.57–1.00)
Globulin, Total: 3.1 g/dL (ref 1.5–4.5)
Glucose: 114 mg/dL — ABNORMAL HIGH (ref 70–99)
Potassium: 4.1 mmol/L (ref 3.5–5.2)
Sodium: 134 mmol/L (ref 134–144)
Total Protein: 6.5 g/dL (ref 6.0–8.5)
eGFR: 42 mL/min/{1.73_m2} — ABNORMAL LOW (ref >59)

## 2023-02-05 LAB — CBG MONITORING, ED: Glucose-Capillary: 240 mg/dL — ABNORMAL HIGH (ref 70–99)

## 2023-02-05 LAB — LIPID PANEL W/O CHOL/HDL RATIO
Cholesterol, Total: 112 mg/dL (ref 100–199)
HDL: 44 mg/dL (ref >39)
LDL Chol Calc (NIH): 37 mg/dL (ref 0–99)
Triglycerides: 192 mg/dL — ABNORMAL HIGH (ref 0–149)
VLDL Cholesterol Cal: 31 mg/dL (ref 5–40)

## 2023-02-05 LAB — HIV ANTIBODY (ROUTINE TESTING W REFLEX): HIV Screen 4th Generation wRfx: NONREACTIVE

## 2023-02-05 LAB — LACTIC ACID, PLASMA
Lactic Acid, Venous: 2.1 mmol/L (ref 0.5–1.9)
Lactic Acid, Venous: 1.8 mmol/L (ref 0.5–1.9)

## 2023-02-05 LAB — AMYLASE: Amylase: 47 U/L (ref 31–110)

## 2023-02-05 LAB — LIPASE: Lipase: 34 U/L (ref 14–72)

## 2023-02-05 LAB — C-REACTIVE PROTEIN: CRP: 0.6 mg/dL (ref <1.0)

## 2023-02-05 LAB — TROPONIN I (HIGH SENSITIVITY)
Troponin I (High Sensitivity): 31 ng/L — ABNORMAL HIGH (ref <18)
Troponin I (High Sensitivity): 51 ng/L — ABNORMAL HIGH (ref <18)

## 2023-02-05 LAB — VITAMIN B12: Vitamin B-12: 345 pg/mL (ref 232–1245)

## 2023-02-05 MED ORDER — ROSUVASTATIN CALCIUM 5 MG PO TABS
5.0000 mg | ORAL_TABLET | Freq: Every day | ORAL | Status: DC
  Administered 2023-02-06 – 2023-02-08 (×3): 5 mg via ORAL
  Filled 2023-02-05 (×4): qty 1

## 2023-02-05 MED ORDER — POLYETHYLENE GLYCOL 3350 17 G PO PACK
17.0000 g | PACK | Freq: Two times a day (BID) | ORAL | Status: DC
  Administered 2023-02-05 – 2023-02-06 (×3): 17 g via ORAL
  Filled 2023-02-05 (×6): qty 1

## 2023-02-05 MED ORDER — SERTRALINE HCL 25 MG PO TABS
250.0000 mg | ORAL_TABLET | Freq: Every day | ORAL | Status: DC
  Filled 2023-02-05: qty 2

## 2023-02-05 MED ORDER — ALPRAZOLAM 0.5 MG PO TABS
0.7500 mg | ORAL_TABLET | Freq: Every day | ORAL | Status: DC
  Administered 2023-02-05: 0.75 mg via ORAL
  Filled 2023-02-05 (×2): qty 2

## 2023-02-05 MED ORDER — PRAMIPEXOLE DIHYDROCHLORIDE 0.25 MG PO TABS
1.0000 mg | ORAL_TABLET | Freq: Every day | ORAL | Status: DC
  Administered 2023-02-05 – 2023-02-07 (×3): 1 mg via ORAL
  Filled 2023-02-05 (×3): qty 4

## 2023-02-05 MED ORDER — LORAZEPAM 2 MG/ML IJ SOLN: 0.5000 mg | INTRAMUSCULAR | Status: DC | PRN

## 2023-02-05 MED ORDER — ENOXAPARIN SODIUM 30 MG/0.3ML IJ SOSY
30.0000 mg | PREFILLED_SYRINGE | INTRAMUSCULAR | Status: DC
  Administered 2023-02-05: 30 mg via SUBCUTANEOUS
  Filled 2023-02-05: qty 0.3

## 2023-02-05 MED ORDER — BUPROPION HCL ER (XL) 150 MG PO TB24
450.0000 mg | ORAL_TABLET | Freq: Every day | ORAL | Status: DC
  Filled 2023-02-05: qty 3

## 2023-02-05 MED ORDER — BISACODYL 5 MG PO TBEC: 5.0000 mg | DELAYED_RELEASE_TABLET | Freq: Every day | ORAL | Status: DC | PRN

## 2023-02-05 MED ORDER — LAMOTRIGINE 150 MG PO TABS
400.0000 mg | ORAL_TABLET | Freq: Two times a day (BID) | ORAL | Status: DC
  Filled 2023-02-05 (×2): qty 1

## 2023-02-05 MED ORDER — ROPINIROLE HCL 1 MG PO TABS: 1.0000 mg | ORAL_TABLET | Freq: Every day | ORAL | Status: DC

## 2023-02-05 MED ORDER — ASPIRIN 81 MG PO TBEC
81.0000 mg | DELAYED_RELEASE_TABLET | Freq: Every day | ORAL | Status: DC
  Administered 2023-02-06 – 2023-02-08 (×3): 81 mg via ORAL
  Filled 2023-02-05 (×4): qty 1

## 2023-02-05 MED ORDER — ACETAMINOPHEN 650 MG RE SUPP: 650.0000 mg | Freq: Four times a day (QID) | RECTAL | Status: DC | PRN

## 2023-02-05 MED ORDER — QUETIAPINE FUMARATE 100 MG PO TABS
100.0000 mg | ORAL_TABLET | Freq: Every day | ORAL | Status: DC
  Administered 2023-02-05 – 2023-02-07 (×3): 100 mg via ORAL
  Filled 2023-02-05 (×3): qty 1

## 2023-02-05 MED ORDER — SODIUM CHLORIDE 0.9 % IV SOLN
1.0000 g | Freq: Once | INTRAVENOUS | Status: AC
  Administered 2023-02-05: 1 g via INTRAVENOUS
  Filled 2023-02-05: qty 10

## 2023-02-05 MED ORDER — PROMETHAZINE HCL 25 MG/ML IJ SOLN
12.5000 mg | Freq: Once | INTRAMUSCULAR | Status: AC
  Administered 2023-02-05: 12.5 mg via INTRAMUSCULAR
  Filled 2023-02-05: qty 1

## 2023-02-05 MED ORDER — HYDRALAZINE HCL 20 MG/ML IJ SOLN
5.0000 mg | INTRAMUSCULAR | Status: DC | PRN
  Administered 2023-02-05: 5 mg via INTRAVENOUS
  Filled 2023-02-05: qty 1

## 2023-02-05 MED ORDER — FENTANYL CITRATE PF 50 MCG/ML IJ SOSY
50.0000 ug | PREFILLED_SYRINGE | Freq: Once | INTRAMUSCULAR | Status: AC
  Administered 2023-02-05: 50 ug via INTRAVENOUS
  Filled 2023-02-05: qty 1

## 2023-02-05 MED ORDER — ONDANSETRON HCL 4 MG/2ML IJ SOLN
INTRAMUSCULAR | Status: AC
  Filled 2023-02-05: qty 2

## 2023-02-05 MED ORDER — ONDANSETRON HCL 4 MG/2ML IJ SOLN
4.0000 mg | Freq: Four times a day (QID) | INTRAMUSCULAR | Status: DC | PRN
  Administered 2023-02-05 – 2023-02-07 (×7): 4 mg via INTRAVENOUS
  Filled 2023-02-05 (×7): qty 2

## 2023-02-05 MED ORDER — SENNOSIDES-DOCUSATE SODIUM 8.6-50 MG PO TABS
1.0000 | ORAL_TABLET | Freq: Every day | ORAL | Status: DC
  Administered 2023-02-06 – 2023-02-08 (×3): 1 via ORAL
  Filled 2023-02-05 (×4): qty 1

## 2023-02-05 MED ORDER — LACTATED RINGERS IV BOLUS
1000.0000 mL | Freq: Once | INTRAVENOUS | Status: AC
  Administered 2023-02-05: 1000 mL via INTRAVENOUS

## 2023-02-05 MED ORDER — ONDANSETRON HCL 4 MG/2ML IJ SOLN
4.0000 mg | Freq: Once | INTRAMUSCULAR | Status: AC
  Administered 2023-02-05: 4 mg via INTRAVENOUS
  Filled 2023-02-05: qty 2

## 2023-02-05 MED ORDER — ACETAMINOPHEN 325 MG PO TABS
650.0000 mg | ORAL_TABLET | Freq: Four times a day (QID) | ORAL | Status: DC | PRN
  Administered 2023-02-05 – 2023-02-08 (×5): 650 mg via ORAL
  Filled 2023-02-05 (×5): qty 2

## 2023-02-05 MED ORDER — SODIUM CHLORIDE 0.9% FLUSH
3.0000 mL | Freq: Two times a day (BID) | INTRAVENOUS | Status: DC
  Administered 2023-02-05 – 2023-02-08 (×7): 3 mL via INTRAVENOUS

## 2023-02-05 MED ORDER — QUETIAPINE FUMARATE 100 MG PO TABS: 300.0000 mg | ORAL_TABLET | Freq: Every day | ORAL | Status: DC

## 2023-02-05 MED ORDER — LACTATED RINGERS IV SOLN: INTRAVENOUS | Status: DC

## 2023-02-05 MED ORDER — POLYETHYLENE GLYCOL 3350 17 G PO PACK: 17.0000 g | PACK | Freq: Every day | ORAL | Status: DC | PRN

## 2023-02-05 MED ORDER — ALPRAZOLAM 0.5 MG PO TABS
0.5000 mg | ORAL_TABLET | Freq: Every day | ORAL | Status: DC
  Administered 2023-02-05 – 2023-02-06 (×2): 0.5 mg via ORAL
  Filled 2023-02-05 (×2): qty 1

## 2023-02-05 MED ORDER — LORAZEPAM 2 MG/ML IJ SOLN
0.5000 mg | Freq: Once | INTRAMUSCULAR | Status: AC
  Administered 2023-02-05: 0.5 mg via INTRAVENOUS
  Filled 2023-02-05: qty 1

## 2023-02-05 NOTE — Progress Notes (Signed)
Contacted via MyChart   Good morning Amy Hansen, I see you are admitted in hospital at this time for fluids and assessment.  Your labs with me have returned and are overall stable with exception of kidney function which has declined some, which they are currently treating you for with fluids.  I am glad you are getting some IV therapy and hope you get to feeling better.

## 2023-02-05 NOTE — ED Notes (Signed)
Pt was assisted to floor from wc by visitor, pt denies hitting head. Pt was brought back to triage to be reassessed by PA on duty.

## 2023-02-05 NOTE — H&P (Signed)
History and Physical    Patient: Amy Hansen:[id number] DOB: [birth year] DOA: 02/04/2023 DOS: the patient was seen and examined on 02/05/2023 PCP: [name], NP  Patient coming from: Home - lives with husband, daughter, granddaughters; NOK: Sherrilee Gilles, [phone]   Chief Complaint: B flank pain  HPI: Amy Hansen is a 67 y.o. female with medical history significant of chronic diastolic CHF, colitis, DM, and TIA presenting with flank pain.  Patient has a pan-positive ROS and reports anxiety most of all.  +headache, + chills, vision changes, "frequent TIAs" with memory loss, tinnitus, dysphagia, weight loss, palpitations, SOB, cough, abdominal pain (not present on exam while distracted), intractable n/v, urinary frequency, rash (not present on exam), bilateral flank pain (not present on exam while distracted), numbness, severe anxiety.  She went to be seen at urology recently and was treated with doxy x 10 days.  She also went to urgent care but they turned her away when they learned that GI told her to go to the ER.    ER Course:  Carryover, per Dr. Janalyn Shy:  Bilateral flank pain.  CT abdomen pelvis negative for pyelonephritis however given patient has bilateral flank pain in the setting of tachycardia concern for pyelonephritis by ED physician.  Currently on ceftriaxone      Review of Systems: Diffusely positive  Past Medical History:  Diagnosis Date   Anemia    Anxiety    Arthritis    Blood transfusion without reported diagnosis    Cataract    CHF (congestive heart failure) (HCC)    Chronic kidney disease    UTI, hematuria in urine   Colitis    Crohn's disease (HCC)    Depression    Diabetes (HCC)    Diverticulosis    Frequent headaches    Interstitial cystitis    Recurrent UTI    Restless leg syndrome    TIA (transient ischemic attack) 02/20/2021   Urinary frequency    Past Surgical History:  Procedure Laterality Date   bariatric bypass  2012    BIOPSY  05/03/2020   Procedure: BIOPSY;  Surgeon: Rachael Fee, MD;  Location: [facility] ENDOSCOPY;  Service: Endoscopy;;   CARPAL TUNNEL RELEASE Right 2003   CARPAL TUNNEL RELEASE Right    2008   CHOLECYSTECTOMY  1975   COLONOSCOPY     COLONOSCOPY WITH PROPOFOL N/A 05/03/2020   Procedure: COLONOSCOPY WITH PROPOFOL;  Surgeon: Rachael Fee, MD;  Location: [facility] ENDOSCOPY;  Service: Endoscopy;  Laterality: N/A;   CYSTOSCOPY W/ RETROGRADES Bilateral 06/06/2015   Procedure: CYSTOSCOPY WITH RETROGRADE PYELOGRAM;  Surgeon: Jerilee Field, MD;  Location: [facility] ORS;  Service: Urology;  Laterality: Bilateral;   EYE SURGERY Left 07/06/2022   FL INJ LEFT KNEE CT ARTHROGRAM ([facility] HX) Left    1995   GASTRIC BYPASS  2010   HAND SURGERY Left 01/19/2021   Thumb   HEMORRHOID SURGERY  2013   KNEE ARTHROSCOPY Left 1996   TONSILLECTOMY     TOTAL ABDOMINAL HYSTERECTOMY W/ BILATERAL SALPINGOOPHORECTOMY     Social History:  reports that she quit smoking about 47 years ago. Her smoking use included cigarettes. She smoked an average of .5 packs per day. She has never used smokeless tobacco. She reports that she does not drink alcohol and does not use drugs.  Allergies  Allergen Reactions   Avelox [Moxifloxacin Hcl In Nacl] Anaphylaxis   Bactrim [Sulfamethoxazole-Trimethoprim] Anaphylaxis   Ciprofloxacin Other (See Comments)    Pt states she  was told never to take this as it is in the same family as Avelox.    Buspar [Buspirone] Other (See Comments)    hallucinations   Neomycin-Polymyxin-Dexameth Other (See Comments)    Burning in the eyes   Aquaphor [Lanolin-Petrolatum] Itching and Rash   Depakote [Divalproex Sodium] Other (See Comments)    Unknown Reaction   Imitrex [Sumatriptan] Other (See Comments)    Neck and shoulder pain   Stadol [Butorphanol] Rash    Family History  Problem Relation Age of Onset   Colon cancer Mother    Stroke Father    Heart failure Sister    Bladder Cancer Neg Hx     Kidney disease Neg Hx    Prostate cancer Neg Hx    Kidney cancer Neg Hx    Pancreatic cancer Neg Hx    Esophageal cancer Neg Hx    Stomach cancer Neg Hx    Rectal cancer Neg Hx    Breast cancer Neg Hx     Prior to Admission medications   Medication Sig Start Date End Date Taking? Authorizing Provider  ALPRAZolam Prudy Feeler) 0.5 MG tablet 1 po q am, and 1.5 po qhs 01/08/23   Hurst, Glade Nurse, PA-C  aspirin EC 81 MG tablet Take 1 tablet (81 mg) by mouth once daily. Swallow whole.    [provider]  buPROPion (WELLBUTRIN XL) 150 MG 24 hr tablet Take 3 tablets (450 mg total) by mouth daily. 11/30/21   Cherie Ouch, PA-C  clopidogrel (PLAVIX) 75 MG tablet  12/14/22   [provider]  conjugated estrogens (PREMARIN) vaginal cream Apply one pea-sized amount around the opening of the urethra daily for 2 weeks, then 3 times weekly moving forward. 09/19/22   Vaillancourt, Lelon Mast, PA-C  furosemide (LASIX) 20 MG tablet Take 1 tablet (20 mg total) by mouth daily. May take an additional tablet (20mg ) AS NEEDED for weight gain and/or swelling. 08/13/22 08/08/23  Sondra Barges, PA-C  lamoTRIgine (LAMICTAL) 200 MG tablet Take 2 tablets (400 mg total) by mouth 2 (two) times daily. 10/18/22   Melony Overly T, PA-C  ondansetron (ZOFRAN-ODT) 8 MG disintegrating tablet Take 1 tablet (8 mg total) by mouth every 8 (eight) hours as needed for nausea or vomiting. 10/30/22   [name], NP  pramipexole (MIRAPEX) 1 MG tablet Take 1 mg by mouth at bedtime. 11/03/22   [provider]  promethazine (PHENERGAN) 25 MG tablet Take 1 tablet (25 mg total) by mouth every 8 (eight) hours as needed for nausea or vomiting. 07/04/22   Armbruster, Willaim Rayas, MD  QUEtiapine (SEROQUEL) 300 MG tablet Take 1 tablet (300 mg total) by mouth at bedtime. 10/18/22   Melony Overly T, PA-C  rOPINIRole (REQUIP) 1 MG tablet Take 1 tablet (1 mg total) by mouth at bedtime. 11/05/22   [name], NP  rosuvastatin (CRESTOR) 5  MG tablet Take 1 tablet by mouth once daily 11/30/22   [name], NP  senna-docusate (SENOKOT-S) 8.6-50 MG tablet Take 1 tablet by mouth daily. 12/21/22   Elayne Snare K, DO  sertraline (ZOLOFT) 100 MG tablet Take 2.5 tablets (250 mg total) by mouth daily. 01/08/23   Melony Overly T, PA-C  tirzepatide [facility]) 5 MG/0.5ML Pen INJECT 1 SYRINGE SUBCUTANEOUSLY ONCE A WEEK 11/09/22   [name], NP  vedolizumab (ENTYVIO) 300 MG injection Inject into the vein. Every 4 weeks    [provider]    Physical Exam: Vitals:   02/04/23 2232 02/05/23 0410  02/05/23 0743 02/05/23 1043  BP: (!) 165/111 (!) 163/117 (!) 183/116 (!) 186/99  Pulse: (!) 109 (!) 113 (!) 121 (!) 107  Resp: (!) 24 (!) 21 18 16   Temp:  98.9 F (37.2 C) 98 F (36.7 C) 98.4 F (36.9 C)  TempSrc:  Oral Oral   SpO2: 99% 91% 99% 99%  Weight:      Height:       General:  Appears calm and comfortable and is in NAD, palpably anxious Eyes:   EOMI, normal lids, iris ENT:  grossly normal hearing, lips & tongue, mmm Neck:  no LAD, masses or thyromegaly Cardiovascular:  RR with tachycardia, no m/r/g. No LE edema.  Respiratory:   CTA bilaterally with no wheezes/rales/rhonchi.  Normal respiratory effort. Abdomen:  soft, NT, ND Skin:  no rash or induration seen on limited exam Musculoskeletal:  grossly normal tone BUE/BLE, good ROM, no bony abnormality Psychiatric:  extremely anxious mood and affect, speech fluent and appropriate, AOx3 Neurologic:  CN 2-12 grossly intact, moves all extremities in coordinated fashion   Radiological Exams on Admission: Independently reviewed - see discussion in A/P where applicable  DG Chest 2 View  Result Date: 02/05/2023 CLINICAL DATA:  67 year old female with vomiting, weakness. EXAM: CHEST - 2 VIEW COMPARISON:  Chest radiograph 09/26/2022 and earlier. FINDINGS: Seated upright AP and lateral views of the chest at 0638 hours. Lung volumes and mediastinal contours remain  normal. Visualized tracheal air column is within normal limits. Lung markings do not appear significantly changed from 2013 radiographs, with no pneumothorax, pulmonary edema, pleural effusion, or confluent lung opacity. Subacute or chronic appearing deformity of the left humeral neck. Periosteal new bone formation there. Fracture there was demonstrated on left humerus series 12/21/2022. No new osseous abnormality identified. Negative visible bowel gas. IMPRESSION: 1. No acute cardiopulmonary abnormality. 2. Proximal left humerus fracture which occurred in May. Electronically Signed   By: Odessa Fleming M.D.   On: 02/05/2023 06:53   CT HEAD WO CONTRAST ( )  Result Date: 02/05/2023 CLINICAL DATA:  Multiple falls, facial trauma EXAM: CT HEAD WITHOUT CONTRAST TECHNIQUE: Contiguous axial images were obtained from the base of the skull through the vertex without intravenous contrast. RADIATION DOSE REDUCTION: This exam was performed according to the departmental dose-optimization program which includes automated exposure control, adjustment of the mA and/or kV according to patient size and/or use of iterative reconstruction technique. COMPARISON:  12/14/2022 CTA head and neck FINDINGS: Brain: No evidence of acute infarction, hemorrhage, mass, mass effect, or midline shift. No hydrocephalus or extra-axial fluid collection. Periventricular white matter changes, likely the sequela of chronic small vessel ischemic disease. Vascular: No hyperdense vessel. Atherosclerotic calcifications in the intracranial carotid and vertebral arteries. Skull: Negative for fracture or focal lesion. Sinuses/Orbits: No acute finding. Other: The mastoid air cells are well aerated. IMPRESSION: No acute intracranial process. Electronically Signed   By: Wiliam Ke M.D.   On: 02/05/2023 03:17   CT Renal Stone Study  Result Date: 02/05/2023 CLINICAL DATA:  Abdominal and flank pain vomiting and chills. Evaluate for pancreatitis. EXAM: CT ABDOMEN  AND PELVIS WITHOUT CONTRAST TECHNIQUE: Multidetector CT imaging of the abdomen and pelvis was performed following the standard protocol without IV contrast. RADIATION DOSE REDUCTION: This exam was performed according to the departmental dose-optimization program which includes automated exposure control, adjustment of the mA and/or kV according to patient size and/or use of iterative reconstruction technique. COMPARISON:  CT abdomen and pelvis 11/13/2022 FINDINGS: Lower chest: No acute abnormality. Hepatobiliary:  No focal liver abnormality is seen. Status post cholecystectomy. No biliary dilatation. Pancreas: Unremarkable. No pancreatic ductal dilatation or surrounding inflammatory changes. Spleen: Normal in size without focal abnormality. Adrenals/Urinary Tract: There is a 6 mm calculus in the left kidney. There is no hydronephrosis or perinephric fluid. Otherwise, the kidneys, adrenal glands, and bladder are within normal limits. 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 There is no evidence for bowel obstruction, pneumatosis or free air. The appendix is not visualized. There is a large amount of stool throughout the colon. Scattered colonic diverticula are present. Vascular/Lymphatic: Aortic atherosclerosis. No enlarged abdominal or pelvic lymph nodes. Reproductive: Status post hysterectomy. No adnexal masses. Other: No abdominal wall hernia or abnormality. No abdominopelvic ascites. 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 L2-L3 degenerative endplate changes are similar to the prior study. IMPRESSION: 1. No acute localizing process in the abdomen or pelvis. 2. Nonobstructing left renal calculus. 3. Gastric bypass changes. Aortic Atherosclerosis (ICD10-I70.0). Electronically Signed   By: Darliss Cheney M.D.   On: 02/05/2023 02:27   DG Abdomen 1 View  Result Date: 02/04/2023 CLINICAL DATA:  Evaluate for obstruction.  Vomiting EXAM: ABDOMEN - 1 VIEW COMPARISON:  None Available.  FINDINGS: Nonobstructive bowel gas pattern. No organomegaly or free air. No suspicious calcification or acute bony abnormality. Levoscoliosis in the upper lumbar spine. IMPRESSION: No evidence of bowel obstruction or free air. Electronically Signed   By: Charlett Nose M.D.   On: 02/04/2023 19:00    EKG: Independently reviewed.  Sinus tachycardia with rate 117; nonspecific ST changes with no evidence of acute ischemia   Labs on Admission: I have personally reviewed the available labs and imaging studies at the time of the admission.  Pertinent labs:    Na++ 132 Glucose 159 BUN 18/Creatinine 1.34/GFR 44; 12/0.78/>60 on 05/05/22 AP 205 Albumin 3.0 Normal CBC UA: moderate Hgb, moderate LE, rare bacteria Lactate 2.1, 1.8 HS troponin 31 CK 72   Assessment and Plan: Principal Problem:   AKI (acute kidney injury) (HCC) Active Problems:   Restless legs syndrome (RLS)   Diabetes mellitus type 2 in nonobese (HCC)   Hypertension associated with type 2 diabetes mellitus (HCC)   Chronic heart failure with preserved ejection fraction (HCC)   Pancolitis (HCC)   Anxiety disorder   Dysphagia    AKI -Patient without baseline compromised renal function  -Current creatinine is increased at least 1.5 times compared to baseline and presumed to have occurred within the last 7 days -Likely due to prerenal secondary to severe dehydration in the setting of dysphagia and n/v  -IVF -Follow up renal function by BMP -Avoid ACEI and NSAIDs   Anxiety -She had a diffusely positive ROS and obvious frank anxiety bordering on panic at the time of my evaluation -She is on significant psychotropic medications at baseline including alprazolam, bupropion, lamotrigine, Quetiapine, sertraline -She is on both Mirapex and Ropinirole - will hold one for now -Psychiatry consult requested  Dysphagia, N/V, Colitis -She has had significant colitis but this appears to be fairly well controlled on Entyvio -Will check  fecal calprotectin and CRP to ensure stability -She has a large amount of stool on CT and so likely needs a bowel regimen, which might help her n/v (although she was mostly just retching at the time of my evaluation and emesis bag was generally clear) - adding Senekot S (home med) + Miralax BID -Chronic AP elevation, appears generally stable -Discussed with GI - she likely will need a liver biopsy in  the future but this does not appear to be related to current symptoms -If not improving, GI is happy to formally consult -Will order speech therapy swallow evaluation  HTN -She does not appear to be taking medications for this issue at this time   DM -She has had significant weight loss and is quite thin -She was taking Mounjaro but has stopped this (appropriately) -Nutrition consult ordered -She is not currently on medications for this issue  Chronic diastolic CHF -Appears compenstated  HLD -Continue rosuvastatin     Advance Care Planning:   Code Status: Full Code - Code status was discussed with the patient and/or family at the time of admission.  The patient would want to receive full resuscitative measures at this time.   Consults: Psychiatry; GI (telephone only); PT/OT/SLP; TOC team; nutrition  DVT Prophylaxis: Lovenox  Family Communication: Family member present throughout evaluation  Severity of Illness: The appropriate patient status for this patient is OBSERVATION. Observation status is judged to be reasonable and necessary in order to provide the required intensity of service to ensure the patient's safety. The patient's presenting symptoms, physical exam findings, and initial radiographic and laboratory data in the context of their medical condition is felt to place them at decreased risk for further clinical deterioration. Furthermore, it is anticipated that the patient will be medically stable for discharge from the hospital within 2 midnights of admission.    Author: Jonah Blue, MD 02/05/2023 10:55 AM  For on call review [email or web address].

## 2023-02-05 NOTE — ED Provider Notes (Signed)
  2:39 AM Called to triage to reassess patient after she lowered herself to the floor in the lobby.  Per friend with her she seemed to be leaning forward in her chair and lowered herself to the floor.  There was no direct head trauma.  Bystander felt like she may have had a seizure, however no seizure activity witnessed, no incontinence, no tongue/dental injury. She was able to respond to RN during assessment in the lobby.  She has no history of seizures.  She now complains of headache.  She is on plavix so will obtain head CT.  Patient currently awake/alert in triage.  No apparent post-ictal period.  Feel less likely seizure.      Garlon Hatchet, PA-C 02/05/23 0244    Glynn Octave, MD 02/05/23 [phone]

## 2023-02-05 NOTE — Evaluation (Signed)
Occupational Therapy Evaluation Patient Details Name: Amy Hansen MRN: [id number] DOB: 01/04/1956 Today's Date: 02/05/2023   History of Present Illness 67 yo female presents to [facility] on 7/8 with n/v x3 weeks, flank pain. Workup for AKI. PMH includes anxiety, HTN, DM, colitis, recurrent falls, HF, TIA.   Clinical Impression   Bandi was evaluated s/p the above admission list. She reports several falls and a general health decline at baseline. She lives with her husband, daughter and granddaughters who assist with ADLs, and pt mobilizes with a SPC or 3-wheeled walker. Of note, pt fell 5/24 and has a L humeral fx, per her report she must maintain the sling and NWB. Upon evaluation the pt was limited by generalized weakness, anxiety, self-distracting thought, decreased balance and poor activity tolerance. Overall she completed simple transfers and short mobility with up to min G, no AD. Due to the deficits listed below the pt also needs up to min G for ADLs including standing at the sink for grooming tasks. Cues needed throughout for attention, pt has significant self-distracting thought. Pt will benefit from continued acute OT services and HHOT to facilitate safe transition to home setting with 24/7 assist from family.       Recommendations for follow up therapy are one component of a multi-disciplinary discharge planning process, led by the attending physician.  Recommendations may be updated based on patient status, additional functional criteria and insurance authorization.   Assistance Recommended at Discharge Frequent or constant Supervision/Assistance  Patient can return home with the following A little help with walking and/or transfers;A little help with bathing/dressing/bathroom;Assistance with cooking/housework;Direct supervision/assist for medications management;Direct supervision/assist for financial management;Assist for transportation;Help with stairs or ramp for entrance    Functional  Status Assessment  Patient has had a recent decline in their functional status and demonstrates the ability to make significant improvements in function in a reasonable and predictable amount of time.  Equipment Recommendations  None recommended by OT    Recommendations for Other Services       Precautions / Restrictions Precautions Precautions: Fall Precaution Comments: L humeral fx 12/21/2022 Required Braces or Orthoses: Sling Restrictions Weight Bearing Restrictions: Yes LUE Weight Bearing: Non weight bearing (per pt report)      Mobility Bed Mobility Overal bed mobility: Needs Assistance Bed Mobility: Supine to Sit, Sit to Supine     Supine to sit: Supervision, HOB elevated Sit to supine: Supervision, HOB elevated        Transfers Overall transfer level: Needs assistance Equipment used: None Transfers: Sit to/from Stand Sit to Stand: Min guard           General transfer comment: min G for STS, pivotal steps to the [facility] and for short mobility to/from the sink      Balance Overall balance assessment: Needs assistance, History of Falls Sitting-balance support: No upper extremity supported, Feet supported Sitting balance-Leahy Scale: Fair     Standing balance support: Single extremity supported, During functional activity, Reliant on assistive device for balance Standing balance-Leahy Scale: Poor                             ADL either performed or assessed with clinical judgement   ADL Overall ADL's : Needs assistance/impaired Eating/Feeding: Independent   Grooming: Min guard;Standing Grooming Details (indicate cue type and reason): washed hands at the sink Upper Body Bathing: Set up;Sitting   Lower Body Bathing: Min guard;Sit to/from stand   Upper  Body Dressing : Set up;Sitting   Lower Body Dressing: Min guard;Sit to/from stand   Toilet Transfer: Min Acupuncturist Details (indicate cue type and reason): pt  declined ambulating to the bathroom Toileting- Clothing Manipulation and Hygiene: Supervision/safety;Sitting/lateral lean       Functional mobility during ADLs: Min guard General ADL Comments: unsteady, poor activity tolerance     Vision Baseline Vision/History: 0 No visual deficits Vision Assessment?: No apparent visual deficits     Perception Perception Perception Tested?: No   Praxis Praxis Praxis tested?: Not tested    Pertinent Vitals/Pain Pain Assessment Pain Assessment: Faces Faces Pain Scale: Hurts little more Pain Location: L arm, back Pain Descriptors / Indicators: Sore, Discomfort Pain Intervention(s): Limited activity within patient's tolerance, Monitored during session     Hand Dominance Right   Extremity/Trunk Assessment Upper Extremity Assessment Upper Extremity Assessment: LUE deficits/detail LUE Deficits / Details: L humeral fx, arm in sling. Pt self reports NWB and immobile at the shoulder. Elbow, wrist and hand are [facility]   Lower Extremity Assessment Lower Extremity Assessment: Generalized weakness   Cervical / Trunk Assessment Cervical / Trunk Assessment: Kyphotic   Communication Communication Communication: No difficulties   Cognition Arousal/Alertness: Awake/alert Behavior During Therapy: Anxious Overall Cognitive Status: Impaired/Different from baseline Area of Impairment: Memory, Attention, Problem solving                   Current Attention Level: Sustained Memory: Decreased short-term memory       Problem Solving: Requires verbal cues, Requires tactile cues General Comments: pt internally distracted about figuring about what is going on medically with her, states she feels like "I am dying, I can't live feeling like this". Highly anxious. Verbose with alternating thought, needs to be redirected     General Comments  asking for anxiety medication    Exercises     Shoulder Instructions      Home Living Family/patient  expects to be discharged to:: Private residence Living Arrangements: Children;Spouse/significant other Available Help at Discharge: Family Type of Home: House Home Access: Stairs to enter [employer] of Steps: 3 Entrance Stairs-Rails: None Home Layout: One level;Laundry or work area in basement     [employer] Shower/Tub: Chief Strategy Officer: Standard     Home Equipment: Agricultural consultant (2 wheels);BSC/3in1;Shower seat;Cane - single point   Additional Comments: husband, dtr, and 2 granddaughters      Prior Functioning/Environment Prior Level of Function : Needs assist;History of Falls (last six months)             Mobility Comments: reports several falls, uses SPC vs 3 wheeled rollator ADLs Comments: has needed assist for ADLs due to LUE fx, prior to she was indep        OT Problem List: Decreased range of motion;Decreased strength;Decreased activity tolerance;Impaired balance (sitting and/or standing);Decreased safety awareness;Decreased coordination;Decreased knowledge of use of DME or AE;Decreased knowledge of precautions;Pain      OT Treatment/Interventions: Self-care/ADL training;Therapeutic exercise;DME and/or AE instruction;Therapeutic activities;Modalities;Balance training;Patient/family education    OT Goals(Current goals can be found in the care plan section) Acute Rehab OT Goals Patient Stated Goal: to feel better OT Goal Formulation: With patient Time For Goal Achievement: 02/19/23 Potential to Achieve Goals: Good ADL Goals Pt Will Perform Lower Body Dressing: Independently;sit to/from stand Pt Will Transfer to Toilet: with modified independence;ambulating Additional ADL Goal #1: Pt will tolerate at least 8 minutes of OOB activity to faciliate safe discharge home Additional ADL Goal #  2: Pt will follow 3 step way-finding task as a precursor to higher level IADLs  OT Frequency: Min 2X/week    Co-evaluation              AM-PAC OT  "6 Clicks" Daily Activity     Outcome Measure Help from another person eating meals?: None Help from another person taking care of personal grooming?: A Little Help from another person toileting, which includes using toliet, bedpan, or urinal?: A Little Help from another person bathing (including washing, rinsing, drying)?: A Little Help from another person to put on and taking off regular upper body clothing?: A Little Help from another person to put on and taking off regular lower body clothing?: A Little 6 Click Score: 19   End of Session Equipment Utilized During Treatment: Gait belt Nurse Communication: Mobility status  Activity Tolerance: Patient tolerated treatment well Patient left: in bed;with call bell/phone within reach;with bed alarm set  OT Visit Diagnosis: Unsteadiness on feet (R26.81);Other abnormalities of gait and mobility (R26.89);Muscle weakness (generalized) (M62.81);History of falling (Z91.81);Repeated falls (R29.6);Pain                Time: 4098-1191 OT Time Calculation (min): 19 min Charges:  OT General Charges $OT Visit: 1 Visit OT Evaluation $OT Eval Moderate Complexity: 1 Mod  Derenda Mis, OTR/L Acute Rehabilitation Services Office [phone] Secure Chat Communication Preferred   Donia Pounds 02/05/2023, 2:39 PM

## 2023-02-05 NOTE — Telephone Encounter (Signed)
Dr. Adela Lank is out of the office for the rest of the week. Will send to him as an FYI upon his return.

## 2023-02-05 NOTE — Consult Note (Signed)
[facility] Psychiatry Consult Evaluation  Service Date: February 05, 2023 LOS:  LOS: 0 days    Primary Psychiatric Diagnoses  Bipolar Disorder 2.  Anxiety 3.  BZD w/d  Assessment  Amy Hansen is Amy 67 y.o. female admitted medically for 02/04/2023  5:26 PM for bilateral flank pain after about Amy week of N+V. She carries the psychiatric diagnoses of bipolar disorder and GAD and has Amy past medical history of  arthritis, cataracts, CHF, colitis/chrohn's, diabetes, headaches, recurrent UTIs and restless leg syndrome. Psychiatry was consulted for palpable anxiety by Dr. Ophelia Hansen.   Her current presentation of worsening psychiatric symptoms particularly anxiety over the last month is most consistent with medication noncompliance (patient unable to hold pills down).  Thankfully nausea was somewhat improved by the time that I saw her.  She meets criteria for bipolar disorder and generalized anxiety disorder based on clinical interview.  Current outpatient psychotropic medications include Xanax, Wellbutrin, Seroquel, pramipexole, Requip, Zoloft, Lamictal and historically she has had Amy fair response to these medications.On initial examination, patient had responded fairly well to IV Ativan and was able to engage well in extended interview.  Using joint decision-making, we set the goals of treating patient's anxiety, helping patient's sleep, and avoiding triggering Amy manic episode or worsening nausea (do not want to start several serotonergic agents at once).  I explained in detail to patient that as she has been off of her extensive regimen for about Amy month we will be restarting medication slowly and she may or may not be discharged on her prior outpatient regimen.  please see plan below for detailed recommendations.   Diagnoses:  Active Hospital problems: Principal Problem:   AKI (acute kidney injury) (HCC) Active Problems:   Restless legs syndrome (RLS)   Diabetes mellitus type 2 in nonobese (HCC)    Hypertension associated with type 2 diabetes mellitus (HCC)   Chronic heart failure with preserved ejection fraction (HCC)   Pancolitis (HCC)   Anxiety disorder   Dysphagia     Plan   ## Psychiatric Medication Recommendations:  -- start seroquel 100 at bedtime  -- start home xanax ( pt on long slow outpt taper)  Hold remainder of psych meds   ## Medical Decision Making Capacity:  Not formally assessed  ## Further Work-up:  -- none currently     -- most recent EKG on 7/8 had QtC of 440 -- Pertinent labwork reviewed earlier this admission includes: AKI  ## Disposition:  -- There are no current psychiatric contraindications to discharge at this time  ## Behavioral / Environmental:  --   No specific recommendations at this time.     ## Safety and Observation Level:  - Based on my clinical evaluation, I estimate the patient to be at low risk of self harm in the current setting - At this time, we recommend Amy routine level of observation. This decision is based on my review of the chart including patient's history and current presentation, interview of the patient, mental status examination, and consideration of suicide risk including evaluating suicidal ideation, plan, intent, suicidal or self-harm behaviors, risk factors, and protective factors. This judgment is based on our ability to directly address suicide risk, implement suicide prevention strategies and develop Amy safety plan while the patient is in the clinical setting. Please contact our team if there is Amy concern that risk level has changed.    Amy Hansen Amy Hansen  Psychiatric and Social History   Relevant Aspects of Hospital  Course:  Admitted on 02/04/2023 for L flank pain, nausea and vomiting. They were extremely anxious in the ED and psychiatry was consulted.   Patient Report:  Patient was much calmer by the time that I saw her.  She was fully alert and oriented if Amy tad bit circumstantial.  She is able to  provide Amy good narrative history of events leading to her hospitalization, importantly sharing that she has not taken her psychiatric medications with any degree of regularity since she broke her arm several weeks ago.  By my best guess she has not really taken anything within the last month with Amy likely exception of her Xanax.  She consistently expresses Amy desire to get away from the number of sedating medications she is on which in addition to her Xanax included both hydrocodone and tizanidine.  We discussed Amy plan to treat her anxiety and sleep while she is in the hospital without worsening her nausea by restarting the rest of her quite serotonergic outpatient medication regimen which she has not taken for the last month.  Psych ROS:  Depression: Generally endorsed difficulty sleeping since she broke her arm, feelings of guilt, decreased energy/concentration/appetite.  Denied anhedonia.  Overall symptoms of depression are worsened by medical condition. Anxiety: Endorsed significant ruminative thought process.  Has infrequent panic attacks.  Feels that generalized anxiety is more disabling. Mania (lifetime and current): Remote episodes of mania including irritability, aggression, decreased sleep, impulsive decisions.  Not current. Episodes lasteda bout Amy week Psychosis: (lifetime and current): Had vivid and life like hallucinations when prescribed tizanidine and buspirone recently.  Prior to this no lifetime psychotic episodes.  Collateral information:  None today  Psychiatric History:  Information collected from pt, EMR.   Prev Dx/Sx: bipolar disorder, anxiety Current Psych Provider: Lowell Guitar, PA-C. Priorly Dr. Tomasa Hansen Home Meds (current):  Xanax 0.5-1 qAM and 0.75 qPM (tapering) Wellbutrin 450 qAM Quetiapine 300  mg at bedtime Zoloft 250 every day Lamotrigine 200 BID Previous Med Trials: Trazodone, Risperdal, Zoloft, Lunesta, prazosin, Sonata, Prozac, Depakote, Lamictal, lithium,  Wellbutrin, Xanax, Ambien, carbamazepine, Seroquel, Buspar caused falls and hallucinations, Gabapentin-she doesn't want to take   Therapy: not currently  Prior Psych Hospitalization: no  Prior Self Harm: remote SI (decades) Prior Violence: used to throw things at husband when manic (decades)  Family Psych History: father with unspecified dx Family Hx suicide: cousins no 1st degree relatives  Social History:  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 Finds work deeply rewarding.  Living Situation: Husband and daughter Spiritual Hx: yes, strong spiritual beliefs against suicide Access to weapons: under lock and key. She does not have key  Substance History Tobacco use: no Alcohol use: no Drug use: no   Exam Findings   Psychiatric Specialty Exam:  Presentation  General Appearance: Appropriate for Environment; Fairly Groomed  Eye Contact:Good  Speech:Clear and Coherent  Speech Volume:Normal  Handedness:No data recorded  Mood and Affect  Mood:-- (feeling Amy lot better)  Affect:Congruent (mildly anxious)   Thought Process  Thought Processes:-- (circumstantial)  Descriptions of Associations:Intact  Orientation:Full (Time, Place and Person)  Thought Content:-- (devoid of delusions, paranoia)  Hallucinations:Hallucinations: None  Ideas of Reference:None  Suicidal Thoughts:Suicidal Thoughts: No  Homicidal Thoughts:Homicidal Thoughts: No   Sensorium  Memory:Immediate Fair; Recent Fair; Remote Good  Judgment:Fair  Insight:Fair   Executive Functions  Concentration:Fair  Attention Span:Fair  Recall:Fair  Fund of Knowledge:Good  Language:Good   Psychomotor Activity  Psychomotor Activity:Psychomotor Activity:  Normal   Assets  Assets:Communication Skills; Desire for Improvement; Social Support; Talents/Skills; Financial Resources/Insurance; Vocational/Educational   Sleep  Sleep:Sleep:  Poor    Physical Exam: Vital signs:  Temp:  [98 F (36.7 C)-98.9 F (37.2 C)] 98.5 F (36.9 C) (07/09 1456) Pulse Rate:  [104-121] 120 (07/09 1456) Resp:  [15-24] 15 (07/09 1456) BP: (163-186)/(95-117) 166/95 (07/09 1456) SpO2:  [91 %-100 %] 100 % (07/09 1456) Physical Exam HENT:     Head: Normocephalic.  Pulmonary:     Effort: Pulmonary effort is normal.  Neurological:     Mental Status: She is alert.     Blood pressure (!) 166/95, pulse (!) 120, temperature 98.5 F (36.9 C), resp. rate 15, height 5' (1.524 m), weight 49.4 kg, SpO2 100 %. Body mass index is 21.29 kg/m.   Other History   These have been pulled in through the EMR, reviewed, and updated if appropriate.   Family History:  The patient's family history includes Colon cancer in her mother; Heart failure in her sister; Stroke in her father.  Medical History: Past Medical History:  Diagnosis Date   Anemia    Anxiety    Arthritis    Blood transfusion without reported diagnosis    Cataract    CHF (congestive heart failure) (HCC)    Chronic kidney disease    UTI, hematuria in urine   Colitis    Crohn's disease (HCC)    Depression    Diabetes (HCC)    Diverticulosis    Frequent headaches    Interstitial cystitis    Recurrent UTI    Restless leg syndrome    TIA (transient ischemic attack) 02/20/2021   Urinary frequency     Surgical History: Past Surgical History:  Procedure Laterality Date   bariatric bypass  2012   BIOPSY  05/03/2020   Procedure: BIOPSY;  Surgeon: Rachael Fee, MD;  Location: [facility];  Service: Endoscopy;;   CARPAL TUNNEL RELEASE Right 2003   CARPAL TUNNEL RELEASE Right    2008   CHOLECYSTECTOMY  1975   COLONOSCOPY     COLONOSCOPY WITH PROPOFOL N/Amy 05/03/2020   Procedure: COLONOSCOPY WITH PROPOFOL;  Surgeon: Rachael Fee, MD;  Location: [facility];  Service: Endoscopy;  Laterality: N/Amy;   CYSTOSCOPY W/ RETROGRADES Bilateral 06/06/2015   Procedure: CYSTOSCOPY  WITH RETROGRADE PYELOGRAM;  Surgeon: Jerilee Field, MD;  Location: [facility] ORS;  Service: Urology;  Laterality: Bilateral;   EYE SURGERY Left 07/06/2022   FL INJ LEFT KNEE CT ARTHROGRAM ([facility] HX) Left    1995   GASTRIC BYPASS  2010   HAND SURGERY Left 01/19/2021   Thumb   HEMORRHOID SURGERY  2013   KNEE ARTHROSCOPY Left 1996   TONSILLECTOMY     TOTAL ABDOMINAL HYSTERECTOMY W/ BILATERAL SALPINGOOPHORECTOMY      Medications:   Current Facility-Administered Medications:    acetaminophen (TYLENOL) tablet 650 mg, 650 mg, Oral, Q6H PRN, 650 mg at 02/05/23 1132 **OR** acetaminophen (TYLENOL) suppository 650 mg, 650 mg, Rectal, Q6H PRN, Jonah Blue, MD   ALPRAZolam Prudy Feeler) tablet 0.5 mg, 0.5 mg, Oral, Daily, Jonah Blue, MD   ALPRAZolam Prudy Feeler) tablet 0.75 mg, 0.75 mg, Oral, QHS, Pham, Minh Q, RPH-CPP   aspirin EC tablet 81 mg, 81 mg, Oral, Daily, Jonah Blue, MD   bisacodyl (DULCOLAX) EC tablet 5 mg, 5 mg, Oral, Daily PRN, Jonah Blue, MD   enoxaparin (LOVENOX) injection 30 mg, 30 mg, Subcutaneous, Q24H, Jonah Blue, MD, 30 mg at 02/05/23 1132  hydrALAZINE (APRESOLINE) injection 5 mg, 5 mg, Intravenous, Q4H PRN, Jonah Blue, MD, 5 mg at 02/05/23 1132   lactated ringers infusion, , Intravenous, Continuous, Jonah Blue, MD, Last Rate: 75 mL/hr at 02/05/23 1208, Infusion Verify at 02/05/23 1208   ondansetron (ZOFRAN) injection 4 mg, 4 mg, Intravenous, Q6H PRN, Jonah Blue, MD, 4 mg at 02/05/23 0801   polyethylene glycol (MIRALAX / GLYCOLAX) packet 17 g, 17 g, Oral, BID, Jonah Blue, MD   pramipexole (MIRAPEX) tablet 1 mg, 1 mg, Oral, QHS, Jonah Blue, MD   QUEtiapine (SEROQUEL) tablet 100 mg, 100 mg, Oral, QHS, Lyndsay Talamante Amy   rosuvastatin (CRESTOR) tablet 5 mg, 5 mg, Oral, Daily, Jonah Blue, MD   senna-docusate (Senokot-S) tablet 1 tablet, 1 tablet, Oral, Daily, Jonah Blue, MD   sodium chloride flush (NS) 0.9 % injection 3 mL, 3 mL,  Intravenous, Q12H, Jonah Blue, MD, 3 mL at 02/05/23 1132  Allergies: Allergies  Allergen Reactions   Avelox [Moxifloxacin Hcl In Nacl] Anaphylaxis   Bactrim [Sulfamethoxazole-Trimethoprim] Anaphylaxis   Ciprofloxacin Other (See Comments)    Pt states she was told never to take this as it is in the same family as Avelox.    Buspar [Buspirone] Other (See Comments)    hallucinations   Neomycin-Polymyxin-Dexameth Other (See Comments)    Burning in the eyes   Aquaphor [Lanolin-Petrolatum] Itching and Rash   Depakote [Divalproex Sodium] Other (See Comments)    Unknown Reaction   Imitrex [Sumatriptan] Other (See Comments)    Neck and shoulder pain   Stadol [Butorphanol] Rash

## 2023-02-05 NOTE — Evaluation (Signed)
Physical Therapy Evaluation Patient Details Name: Amy Hansen MRN: [id number] DOB: [birth year] Today's Date: 02/05/2023  History of Present Illness  67 yo female presents to [facility] on 7/8 with n/v x3 weeks, flank pain. Workup for AKI. PMH includes anxiety, HTN, DM, colitis, recurrent falls, HF, TIA.  Clinical Impression   Pt presents with generalized weakness, impaired balance with history of frequent falls, impaired activity tolerance, and anxiety with mobility. Pt to benefit from acute PT to address deficits. Pt ambulated hallway distance with use of SPC and occasional steadying assist. Pt reporting anxiety during session, and states "I feel like I am dying, I can't live like this". Pt would benefit from [facility] services for strengthening, balance, LUE rehab. PT to progress mobility as tolerated, and will continue to follow acutely.          Assistance Recommended at Discharge Frequent or constant Supervision/Assistance  If plan is discharge home, recommend the following:  Can travel by private vehicle  A little help with walking and/or transfers;A little help with bathing/dressing/bathroom        Equipment Recommendations None recommended by PT  Recommendations for Other Services       Functional Status Assessment Patient has had a recent decline in their functional status and demonstrates the ability to make significant improvements in function in a reasonable and predictable amount of time.     Precautions / Restrictions Precautions Precautions: Fall Precaution Comments: L humeral fx 12/21/2022 Required Braces or Orthoses: Sling Restrictions Weight Bearing Restrictions: Yes LUE Weight Bearing: Non weight bearing (per pt report)      Mobility  Bed Mobility Overal bed mobility: Needs Assistance Bed Mobility: Supine to Sit, Sit to Supine     Supine to sit: Supervision, HOB elevated Sit to supine: Supervision, HOB elevated   General bed mobility comments: use of bedrails and  increased time    Transfers Overall transfer level: Needs assistance Equipment used: Straight cane Transfers: Sit to/from Stand Sit to Stand: Min assist           General transfer comment: assist to rise and steady    Ambulation/Gait Ambulation/Gait assistance: Min Chemical engineer (Feet): 200 Feet Assistive device: Straight cane Gait Pattern/deviations: Step-through pattern, Decreased stride length, Drifts right/left, Narrow base of support Gait velocity: decr     General Gait Details: assist to periodically steady pt, especially with head turns or change in gaze. Inconsistent placement and use of [facility]  Stairs            Wheelchair Mobility     Tilt Bed    Modified Rankin (Stroke Patients Only)       Balance Overall balance assessment: Needs assistance, History of Falls Sitting-balance support: No upper extremity supported, Feet supported Sitting balance-Leahy Scale: Fair     Standing balance support: Single extremity supported, During functional activity, Reliant on assistive device for balance Standing balance-Leahy Scale: Poor                               Pertinent Vitals/Pain Pain Assessment Pain Assessment: Faces Faces Pain Scale: Hurts little more Pain Location: L arm, back Pain Descriptors / Indicators: Sore, Discomfort Pain Intervention(s): Limited activity within patient's tolerance, Monitored during session, Repositioned    Home Living Family/patient expects to be discharged to:: Private residence Living Arrangements: Children;Spouse/significant other Available Help at Discharge: Family Type of Home: House Home Access: Stairs to enter Entrance Stairs-Rails: None Entrance Stairs-Number of  Steps: 3   Home Layout: One level;Laundry or work area in [employer] Equipment: Agricultural consultant (2 wheels);BSC/3in1;Shower seat;Cane - single point Additional Comments: husband, dtr, and 2 granddaughters    Prior Function Prior  Level of Function : Needs assist;History of Falls (last six months)             Mobility Comments: reports several falls, uses SPC vs 3 wheeled rollator ADLs Comments: has needed assist for ADLs due to LUE fx, prior to she was indep     Hand Dominance   Dominant Hand: Right    Extremity/Trunk Assessment   Upper Extremity Assessment Upper Extremity Assessment: LUE deficits/detail LUE Deficits / Details: L humeral fx, arm in sling. Pt self reports NWB and immobile at the shoulder. Elbow, wrist and hand are [facility]    Lower Extremity Assessment Lower Extremity Assessment: Generalized weakness    Cervical / Trunk Assessment Cervical / Trunk Assessment: Kyphotic  Communication   Communication: No difficulties  Cognition Arousal/Alertness: Awake/alert Behavior During Therapy: Anxious Overall Cognitive Status: Impaired/Different from baseline Area of Impairment: Memory, Attention, Problem solving                   Current Attention Level: Sustained Memory: Decreased short-term memory       Problem Solving: Requires verbal cues, Requires tactile cues General Comments: pt internally distracted about figuring about what is going on medically with her, states she feels like "I am dying, I can't live feeling like this". Highly anxious        General Comments General comments (skin integrity, edema, etc.): asking for anxiety medication    Exercises     Assessment/Plan    PT Assessment Patient needs continued PT services  PT Problem List Decreased strength;Decreased mobility;Decreased activity tolerance;Decreased balance;Decreased knowledge of use of DME;Pain;Decreased safety awareness       PT Treatment Interventions DME instruction;Therapeutic activities;Gait training;Therapeutic exercise;Patient/family education;Balance training;Stair training;Functional mobility training;Neuromuscular re-education    PT Goals (Current goals can be found in the Care Plan section)   Acute Rehab PT Goals Patient Stated Goal: home PT Goal Formulation: With patient Time For Goal Achievement: 02/19/23 Potential to Achieve Goals: Good    Frequency Min 3X/week     Co-evaluation               AM-PAC PT "6 Clicks" Mobility  Outcome Measure Help needed turning from your back to your side while in a flat bed without using bedrails?: A Little Help needed moving from lying on your back to sitting on the side of a flat bed without using bedrails?: A Little Help needed moving to and from a bed to a chair (including a wheelchair)?: A Little Help needed standing up from a chair using your arms (e.g., wheelchair or bedside chair)?: A Little Help needed to walk in hospital room?: A Little Help needed climbing 3-5 steps with a railing? : A Lot 6 Click Score: 17    End of Session Equipment Utilized During Treatment: Other (comment) (pt belt as gait belt) Activity Tolerance: Patient limited by fatigue Patient left: with call bell/phone within reach;in bed;with bed alarm set Nurse Communication: Mobility status PT Visit Diagnosis: Other abnormalities of gait and mobility (R26.89);Muscle weakness (generalized) (M62.81)    Time: 1131-1207 PT Time Calculation (min) (ACUTE ONLY): 36 min   Charges:   PT Evaluation $PT Eval Low Complexity: 1 Low PT Treatments $Therapeutic Activity: 8-22 mins PT General Charges $$ ACUTE PT VISIT: 1 Visit  Marye Round, PT DPT Acute Rehabilitation Services Secure Chat Preferred  Office [phone]   Truddie Coco 02/05/2023, 2:51 PM

## 2023-02-05 NOTE — Progress Notes (Addendum)
Transition of Care [facility]) - Inpatient Brief Assessment   Patient Details  Name: Amy Hansen MRN: [id number] Date of Birth: Feb 24, 1956  Transition of Care [facility]) CM/SW Contact:    Janae Bridgeman, RN Phone Number: 02/05/2023, 1:49 PM   Clinical Narrative: Patient was admitted for AKI and medical assessment to R/O pyelonephritis.  No TOC needs at this time.  CM will continue to follow as patient progresses.  02/05/23 1618 - CM met with the patient at the bedside after PT/OT evaluation.  Medicare choice regarding home health offered to the patient and she did not have a preference.  Frances Furbish was called and Kandee Keen, CM accepted for [facility] for PT/OT.  Orders placed to be co-signed by the MD.  The patient states that she injured her Left humerus and was seeing [facility] in [place], [place] due to worker's compensation injury but she is missed 3 appointments due to her ill health.  Patient normally drives.  Patient declines use of ETOH, drugs nor smoking.  CM will continue to follow the patient for TOC needs. - set up with [facility].  Patient plans on having family drive her home when she is medically stable for discharge.   Transition of Care Asessment:   Patient has primary care physician: (P) Yes Home environment has been reviewed: (P) Yes - live at home with spouse, family Prior level of function:: (P) Independent Prior/Current Home Services: (P) No current home services Social Determinants of Health Reivew: (P) SDOH reviewed no interventions necessary Readmission risk has been reviewed: (P) Yes Transition of care needs: (P) no transition of care needs at this time

## 2023-02-05 NOTE — Evaluation (Signed)
Clinical/Bedside Swallow Evaluation Patient Details  Name: Amy Hansen MRN: [id number] Date of Birth: Dec 23, 1955  Today's Date: 02/05/2023 Time: SLP Start Time (ACUTE ONLY): 1355 SLP Stop Time (ACUTE ONLY): 1412 SLP Time Calculation (min) (ACUTE ONLY): 17 min  Past Medical History:  Past Medical History:  Diagnosis Date   Anemia    Anxiety    Arthritis    Blood transfusion without reported diagnosis    Cataract    CHF (congestive heart failure) (HCC)    Chronic kidney disease    UTI, hematuria in urine   Colitis    Crohn's disease (HCC)    Depression    Diabetes (HCC)    Diverticulosis    Frequent headaches    Interstitial cystitis    Recurrent UTI    Restless leg syndrome    TIA (transient ischemic attack) 02/20/2021   Urinary frequency    Past Surgical History:  Past Surgical History:  Procedure Laterality Date   bariatric bypass  2012   BIOPSY  05/03/2020   Procedure: BIOPSY;  Surgeon: Rachael Fee, MD;  Location: [facility] ENDOSCOPY;  Service: Endoscopy;;   CARPAL TUNNEL RELEASE Right 2003   CARPAL TUNNEL RELEASE Right    2008   CHOLECYSTECTOMY  1975   COLONOSCOPY     COLONOSCOPY WITH PROPOFOL N/A 05/03/2020   Procedure: COLONOSCOPY WITH PROPOFOL;  Surgeon: Rachael Fee, MD;  Location: [facility] ENDOSCOPY;  Service: Endoscopy;  Laterality: N/A;   CYSTOSCOPY W/ RETROGRADES Bilateral 06/06/2015   Procedure: CYSTOSCOPY WITH RETROGRADE PYELOGRAM;  Surgeon: Jerilee Field, MD;  Location: [facility] ORS;  Service: Urology;  Laterality: Bilateral;   EYE SURGERY Left 07/06/2022   FL INJ LEFT KNEE CT ARTHROGRAM ([facility] HX) Left    1995   GASTRIC BYPASS  2010   HAND SURGERY Left 01/19/2021   Thumb   HEMORRHOID SURGERY  2013   KNEE ARTHROSCOPY Left 1996   TONSILLECTOMY     TOTAL ABDOMINAL HYSTERECTOMY W/ BILATERAL SALPINGOOPHORECTOMY     HPI:  67yo female admitted 02/04/23 with B flank pain, N/V x3 wks. PMH: chronic diastolic CHF, colitis, DM, TIA, anxiety/depression, headaches.  CXR - no acute abnormality.    Assessment / Plan / Recommendation  Clinical Impression  Pt seen at bedside for limited assessment of swallow safety. She reports her face is hot, but the rest of her body is cold. Cold washcloth and warm blanket provided. Pt verbalized "I think I am dying".   Pt reports history of difficulty swallowing for several years, describing globus sensation and regurgitation, raising suspicion for esophageal dysmotility. Pt's CN exam is unremarkable. She was leary of taking PO due to recent N/V, however, no overt s/s aspiration observed with sips of thin liquid. Additional textures not given due to N/V. Recommend consideration of esophageal work up to identify nature and extent of esophageal issues. SLP will follow up once results are available to provide education.  SLP Visit Diagnosis: Dysphagia, unspecified (R13.10)    Aspiration Risk  Mild aspiration risk    Diet Recommendation  (clear liquids)    Liquid Administration via: Cup;Straw Medication Administration: Other (Comment) (as tolerated) Supervision: Patient able to self feed Compensations: Minimize environmental distractions;Slow rate;Small sips/bites Postural Changes: Seated upright at 90 degrees;Remain upright for at least 30 minutes after po intake    Other  Recommendations Oral Care Recommendations: Oral care BID    Recommendations for follow up therapy are one component of a multi-disciplinary discharge planning process, led by the attending physician.  Recommendations  may be updated based on patient status, additional functional criteria and insurance authorization.  Follow up Recommendations No SLP follow up      Functional Status Assessment Patient has not had a recent decline in their functional status  Frequency and Duration min 1 x/week  1 week       Prognosis Prognosis for improved oropharyngeal function: Good      Swallow Study   General Date of Onset: 02/04/23 HPI: 67yo female  admitted 02/04/23 with B flank pain, N/V x3 wks. PMH: chronic diastolic CHF, colitis, DM, TIA, anxiety/depression, headaches. CXR - no acute abnormality. Previous Swallow Assessment: none Diet Prior to this Study: Clear liquid diet Temperature Spikes Noted: No Respiratory Status: Room air History of Recent Intubation: No Behavior/Cognition: Alert;Cooperative;Pleasant mood Oral Cavity Assessment: Within Functional Limits Vision: Functional for self-feeding Self-Feeding Abilities: Able to feed self Patient Positioning: Upright in bed Baseline Vocal Quality: Normal Volitional Cough: Weak Volitional Swallow: Able to elicit    Oral/Motor/Sensory Function Overall Oral Motor/Sensory Function: Within functional limits   Ice Chips Ice chips: Not tested   Thin Liquid Thin Liquid: Within functional limits Presentation: Straw    Nectar Thick Nectar Thick Liquid: Not tested   Honey Thick Honey Thick Liquid: Not tested   Puree Puree: Not tested   Solid     Solid: Not tested     Autrey Human B. Murvin Natal, [facility], CCC-SLP Speech Language Pathologist Office: [phone]  Leigh Aurora 02/05/2023,2:43 PM

## 2023-02-05 NOTE — Telephone Encounter (Signed)
Patient called stated she wanted to let Dr. Paulo Fruit know she has been admitted at [facility] due to nausea and vomiting not sure if it has anything to do with Colitis.

## 2023-02-05 NOTE — ED Provider Notes (Signed)
[place] EMERGENCY DEPARTMENT AT [facility] Provider Note   CSN: [id number] Arrival date & time: 02/04/23  1657     History  Chief Complaint  Patient presents with   Emesis   Chills    Amy Hansen is a 67 y.o. female.  Patient with a history of pretension, diabetes, chronic disease, chronic colitis, recurrent falls presenting from her PCP with concern for dehydration.  States she has not felt well for the past 2 to 3 weeks but significantly worse over the past 24 to 48 hours.  She complains of diffuse abdominal pain, back pain, nausea, vomiting, chills but no fever.  Has felt poorly for the past several weeks but especially worse today.  Sent by PCP with concern for dehydration.  Reports diffuse crampy abdominal pain and low back pain with nausea and vomiting.  Unable to quantify how many episodes.  States she has alternating diarrhea and constipation.  No known fever.  Does have pain with urination blood in the urine with history of same.  Has had kidney stones in the past.  Has pain all over and chills and feels very uncomfortable.  Denies any chest pain.  Denies any productive cough.  States unable to eat and drink at home due to ongoing vomiting and pain.   The history is provided by the patient.  Emesis Associated symptoms: abdominal pain, arthralgias, chills, diarrhea, headaches and myalgias   Associated symptoms: no cough and no fever        Home Medications Prior to Admission medications   Medication Sig Start Date End Date Taking? Authorizing Provider  ALPRAZolam Prudy Feeler) 0.5 MG tablet 1 po q am, and 1.5 po qhs 01/08/23   Hurst, Glade Nurse, PA-C  aspirin EC 81 MG tablet Take 1 tablet (81 mg) by mouth once daily. Swallow whole.    [provider]  buPROPion (WELLBUTRIN XL) 150 MG 24 hr tablet Take 3 tablets (450 mg total) by mouth daily. 11/30/21   Cherie Ouch, PA-C  clopidogrel (PLAVIX) 75 MG tablet  12/14/22   [provider]  conjugated  estrogens (PREMARIN) vaginal cream Apply one pea-sized amount around the opening of the urethra daily for 2 weeks, then 3 times weekly moving forward. 09/19/22   Vaillancourt, Lelon Mast, PA-C  furosemide (LASIX) 20 MG tablet Take 1 tablet (20 mg total) by mouth daily. May take an additional tablet (20mg ) AS NEEDED for weight gain and/or swelling. 08/13/22 08/08/23  Sondra Barges, PA-C  lamoTRIgine (LAMICTAL) 200 MG tablet Take 2 tablets (400 mg total) by mouth 2 (two) times daily. 10/18/22   Melony Overly T, PA-C  ondansetron (ZOFRAN-ODT) 8 MG disintegrating tablet Take 1 tablet (8 mg total) by mouth every 8 (eight) hours as needed for nausea or vomiting. 10/30/22   [name], NP  pramipexole (MIRAPEX) 1 MG tablet Take 1 mg by mouth at bedtime. 11/03/22   [provider]  promethazine (PHENERGAN) 25 MG tablet Take 1 tablet (25 mg total) by mouth every 8 (eight) hours as needed for nausea or vomiting. 07/04/22   Armbruster, Willaim Rayas, MD  QUEtiapine (SEROQUEL) 300 MG tablet Take 1 tablet (300 mg total) by mouth at bedtime. 10/18/22   Melony Overly T, PA-C  rOPINIRole (REQUIP) 1 MG tablet Take 1 tablet (1 mg total) by mouth at bedtime. 11/05/22   [name], NP  rosuvastatin (CRESTOR) 5 MG tablet Take 1 tablet by mouth once daily 11/30/22   [name], NP  senna-docusate (SENOKOT-S)  8.6-50 MG tablet Take 1 tablet by mouth daily. 12/21/22   Elayne Snare K, DO  sertraline (ZOLOFT) 100 MG tablet Take 2.5 tablets (250 mg total) by mouth daily. 01/08/23   Melony Overly T, PA-C  tirzepatide [facility]) 5 MG/0.5ML Pen INJECT 1 SYRINGE SUBCUTANEOUSLY ONCE A WEEK 11/09/22   [name], NP  vedolizumab (ENTYVIO) 300 MG injection Inject into the vein. Every 4 weeks    [provider]      Allergies    Avelox [moxifloxacin hcl in nacl], Bactrim [sulfamethoxazole-trimethoprim], Ciprofloxacin, Buspar [buspirone], Neomycin-polymyxin-dexameth, Aquaphor [lanolin-petrolatum], Depakote  [divalproex sodium], Imitrex [sumatriptan], and Stadol [butorphanol]    Review of Systems   Review of Systems  Constitutional:  Positive for activity change, appetite change, chills and fatigue. Negative for fever.  HENT:  Negative for congestion.   Respiratory:  Negative for cough, chest tightness and shortness of breath.   Cardiovascular:  Negative for chest pain.  Gastrointestinal:  Positive for abdominal pain, diarrhea, nausea and vomiting.  Genitourinary:  Negative for dysuria and hematuria.  Musculoskeletal:  Positive for arthralgias, back pain and myalgias.  Skin:  Negative for rash.  Neurological:  Positive for weakness, light-headedness and headaches. Negative for dizziness.   all other systems are negative except as noted in the HPI and PMH.    Physical Exam Updated Vital Signs BP (!) 163/117 (BP Location: Right Arm)   Pulse (!) 113   Temp 98.9 F (37.2 C) (Oral)   Resp (!) 21   Ht 5' (1.524 m)   Wt 49.4 kg   LMP  (LMP Unknown)   SpO2 91%   BMI 21.29 kg/m  Physical Exam Vitals and nursing note reviewed.  Constitutional:      General: She is not in acute distress.    Appearance: She is well-developed. She is ill-appearing.     Comments: Ill-appearing, shaking chills, tremor  HENT:     Head: Normocephalic and atraumatic.     Mouth/Throat:     Mouth: Mucous membranes are dry.     Pharynx: No oropharyngeal exudate.  Eyes:     Conjunctiva/sclera: Conjunctivae normal.     Pupils: Pupils are equal, round, and reactive to light.  Neck:     Comments: No meningismus. Cardiovascular:     Rate and Rhythm: Regular rhythm. Tachycardia present.     Heart sounds: Normal heart sounds. No murmur heard. Pulmonary:     Effort: Pulmonary effort is normal. No respiratory distress.     Breath sounds: Normal breath sounds.  Chest:     Chest wall: No tenderness.  Abdominal:     Palpations: Abdomen is soft.     Tenderness: There is abdominal tenderness. There is no guarding or  rebound.     Comments: Diffuse abdominal tenderness with voluntary guarding through  Musculoskeletal:        General: No tenderness. Normal range of motion.     Cervical back: Normal range of motion and neck supple.     Comments: Bilateral low back pain.  Full range of motion of bilateral lower extremities including hips, ankles and knees bilaterally.  Intact DP and PT pulses.  Skin:    General: Skin is warm.     Findings: No rash.  Neurological:     Mental Status: She is alert and oriented to person, place, and time.     Cranial Nerves: No cranial nerve deficit.     Motor: No abnormal muscle tone.     Coordination: Coordination normal.  Comments: No ataxia on finger to nose bilaterally. No pronator drift. 5/5 strength throughout. CN 2-12 intact.Equal grip strength. Sensation intact.   Psychiatric:        Behavior: Behavior normal.     ED Results / Procedures / Treatments   Labs (all labs ordered are listed, but only abnormal results are displayed) Labs Reviewed  COMPREHENSIVE METABOLIC PANEL - Abnormal; Notable for the following components:      Result Value   Sodium 132 (*)    CO2 21 (*)    Glucose, Bld 159 (*)    Creatinine, Ser 1.34 (*)    Calcium 8.6 (*)    Albumin 3.0 (*)    Alkaline Phosphatase 205 (*)    GFR, Estimated 44 (*)    All other components within normal limits  URINALYSIS, ROUTINE W REFLEX MICROSCOPIC - Abnormal; Notable for the following components:   Hgb urine dipstick MODERATE (*)    Leukocytes,Ua MODERATE (*)    Bacteria, UA RARE (*)    All other components within normal limits  LACTIC ACID, PLASMA - Abnormal; Notable for the following components:   Lactic Acid, Venous 2.1 (*)    All other components within normal limits  CBG MONITORING, ED - Abnormal; Notable for the following components:   Glucose-Capillary 158 (*)    All other components within normal limits  CBG MONITORING, ED - Abnormal; Notable for the following components:    Glucose-Capillary 240 (*)    All other components within normal limits  TROPONIN I (HIGH SENSITIVITY) - Abnormal; Notable for the following components:   Troponin I (High Sensitivity) 31 (*)    All other components within normal limits  TROPONIN I (HIGH SENSITIVITY) - Abnormal; Notable for the following components:   Troponin I (High Sensitivity) 51 (*)    All other components within normal limits  CULTURE, BLOOD (ROUTINE X 2)  CULTURE, BLOOD (ROUTINE X 2)  LIPASE, BLOOD  CBC  LACTIC ACID, PLASMA  CK  RAPID URINE DRUG SCREEN, HOSP PERFORMED    EKG EKG Interpretation Date/Time:  Monday February 04 2023 18:00:32 EDT Ventricular Rate:  117 PR Interval:  168 QRS Duration:  76 QT Interval:  316 QTC Calculation: 440 R Axis:   3  Text Interpretation: Sinus tachycardia Cannot rule out Anterior infarct , age undetermined Abnormal ECG When compared with ECG of 25-Apr-2020 11:44, PREVIOUS ECG IS PRESENT Rate faster Confirmed by Glynn Octave [phone]) on 02/05/2023 4:22:35 AM  Radiology CT HEAD WO CONTRAST ( )  Result Date: 02/05/2023 CLINICAL DATA:  Multiple falls, facial trauma EXAM: CT HEAD WITHOUT CONTRAST TECHNIQUE: Contiguous axial images were obtained from the base of the skull through the vertex without intravenous contrast. RADIATION DOSE REDUCTION: This exam was performed according to the departmental dose-optimization program which includes automated exposure control, adjustment of the mA and/or kV according to patient size and/or use of iterative reconstruction technique. COMPARISON:  12/14/2022 CTA head and neck FINDINGS: Brain: No evidence of acute infarction, hemorrhage, mass, mass effect, or midline shift. No hydrocephalus or extra-axial fluid collection. Periventricular white matter changes, likely the sequela of chronic small vessel ischemic disease. Vascular: No hyperdense vessel. Atherosclerotic calcifications in the intracranial carotid and vertebral arteries. Skull: Negative  for fracture or focal lesion. Sinuses/Orbits: No acute finding. Other: The mastoid air cells are well aerated. IMPRESSION: No acute intracranial process. Electronically Signed   By: Wiliam Ke M.D.   On: 02/05/2023 03:17   CT Renal Stone Study  Result Date: 02/05/2023 CLINICAL DATA:  Abdominal and flank pain vomiting and chills. Evaluate for pancreatitis. EXAM: CT ABDOMEN AND PELVIS WITHOUT CONTRAST TECHNIQUE: Multidetector CT imaging of the abdomen and pelvis was performed following the standard protocol without IV contrast. RADIATION DOSE REDUCTION: This exam was performed according to the departmental dose-optimization program which includes automated exposure control, adjustment of the mA and/or kV according to patient size and/or use of iterative reconstruction technique. COMPARISON:  CT abdomen and pelvis 11/13/2022 FINDINGS: Lower chest: No acute abnormality. Hepatobiliary: No focal liver abnormality is seen. Status post cholecystectomy. No biliary dilatation. Pancreas: Unremarkable. No pancreatic ductal dilatation or surrounding inflammatory changes. Spleen: Normal in size without focal abnormality. Adrenals/Urinary Tract: There is a 6 mm calculus in the left kidney. There is no hydronephrosis or perinephric fluid. Otherwise, the kidneys, adrenal glands, and bladder are within normal limits. 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 There is no evidence for bowel obstruction, pneumatosis or free air. The appendix is not visualized. There is a large amount of stool throughout the colon. Scattered colonic diverticula are present. Vascular/Lymphatic: Aortic atherosclerosis. No enlarged abdominal or pelvic lymph nodes. Reproductive: Status post hysterectomy. No adnexal masses. Other: No abdominal wall hernia or abnormality. No abdominopelvic ascites. 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 L2-L3 degenerative endplate changes are similar to the prior study. IMPRESSION:  1. No acute localizing process in the abdomen or pelvis. 2. Nonobstructing left renal calculus. 3. Gastric bypass changes. Aortic Atherosclerosis (ICD10-I70.0). Electronically Signed   By: Darliss Cheney M.D.   On: 02/05/2023 02:27   DG Abdomen 1 View  Result Date: 02/04/2023 CLINICAL DATA:  Evaluate for obstruction.  Vomiting EXAM: ABDOMEN - 1 VIEW COMPARISON:  None Available. FINDINGS: Nonobstructive bowel gas pattern. No organomegaly or free air. No suspicious calcification or acute bony abnormality. Levoscoliosis in the upper lumbar spine. IMPRESSION: No evidence of bowel obstruction or free air. Electronically Signed   By: Charlett Nose M.D.   On: 02/04/2023 19:00    Procedures Procedures    Medications Ordered in ED Medications  lactated ringers bolus 1,000 mL (has no administration in time range)  lactated ringers bolus 1,000 mL (has no administration in time range)  ondansetron (ZOFRAN) injection 4 mg (has no administration in time range)  fentaNYL (SUBLIMAZE) injection 50 mcg (has no administration in time range)  cefTRIAXone (ROCEPHIN) 1 g in sodium chloride 0.9 % 100 mL IVPB (has no administration in time range)  ondansetron (ZOFRAN-ODT) disintegrating tablet 4 mg (4 mg Oral Given 02/04/23 1837)    ED Course/ Medical Decision Making/ A&P                             Medical Decision Making Amount and/or Complexity of Data Reviewed Labs: ordered. Decision-making details documented in ED Course. Radiology: ordered and independent interpretation performed. Decision-making details documented in ED Course. ECG/medicine tests: ordered and independent interpretation performed. Decision-making details documented in ED Course.  Risk Prescription drug management. Decision regarding hospitalization.   Several weeks of feeling poorly with generalized weakness, abdominal pain, nausea, vomiting and chills.  Tachycardic on arrival but not febrile.  Abdomen soft without peritoneal  signs.  Patient with an IV fluids, pain and nausea medication.  Work from triage shows normal head CT, CT abdomen pelvis without acute abnormality.  Urinalysis questionable for infection with large blood and leukocyte esterase.  Hyperglycemia without DKA. Lactate 2.1. AKI noted.  IVF and IV antibiotics given.  Patient still with significant pain  and nausea and vomiting.   Consider pyelonephritis with dehydration and AKI. No ureteral stones.  Unable to tolerate PO. Remains tachycardic.  Continue IVF and antibiotics.   Admission d/w Dr. Janalyn Shy.         Final Clinical Impression(s) / ED Diagnoses Final diagnoses:  Pyelonephritis  Nausea and vomiting, unspecified vomiting type    Rx / DC Orders ED Discharge Orders     None         Thirza Pellicano, Jeannett Senior, MD 02/05/23 [phone]

## 2023-02-05 NOTE — ED Notes (Signed)
ED TO INPATIENT HANDOFF REPORT  ED Nurse Name and Phone #: Kianni Lheureux [phone]  S Name/Age/Gender Amy Hansen 67 y.o. female Room/Bed: H020C/H020C  Code Status   Code Status: Full Code  Home/SNF/Other Home Patient oriented to: self, place, time, and situation Is this baseline? Yes   Triage Complete: Triage complete  Chief Complaint AKI (acute kidney injury) (HCC) [N17.9]  Triage Note Pt came to ED for vomiting and chills for 3 weeks. Pt denies fever. Pt went to PCP and states they are concerned for pancreatitis. Pt states abd tender, hx of chron's and colitis. Axox4.    Allergies Allergies  Allergen Reactions   Avelox [Moxifloxacin Hcl In Nacl] Anaphylaxis   Bactrim [Sulfamethoxazole-Trimethoprim] Anaphylaxis   Ciprofloxacin Other (See Comments)    Pt states she was told never to take this as it is in the same family as Avelox.    Buspar [Buspirone] Other (See Comments)    hallucinations   Neomycin-Polymyxin-Dexameth Other (See Comments)    Burning in the eyes   Aquaphor [Lanolin-Petrolatum] Itching and Rash   Depakote [Divalproex Sodium] Other (See Comments)    Unknown Reaction   Imitrex [Sumatriptan] Other (See Comments)    Neck and shoulder pain   Stadol [Butorphanol] Rash    Level of Care/Admitting Diagnosis ED Disposition     ED Disposition  Admit   Condition  --   Comment  Hospital Area: [facility] [[id number]]  Level of Care: Telemetry Medical [[id number]]  May place patient in observation at [facility] or [facility] if equivalent level of care is available:: No  Covid Evaluation: Asymptomatic - no recent exposure (last 10 days) testing not required  Diagnosis: AKI (acute kidney injury) [facility]) [[id number]]  Admitting Physician: Jonah Blue [[id number]]  Attending Physician: Jonah Blue [[id number]]          B Medical/Surgery History Past Medical History:  Diagnosis Date   Anemia    Anxiety    Arthritis    Blood transfusion without reported  diagnosis    Cataract    CHF (congestive heart failure) (HCC)    Chronic kidney disease    UTI, hematuria in urine   Colitis    Crohn's disease (HCC)    Depression    Diabetes (HCC)    Diverticulosis    Frequent headaches    Interstitial cystitis    Recurrent UTI    Restless leg syndrome    TIA (transient ischemic attack) 02/20/2021   Urinary frequency    Past Surgical History:  Procedure Laterality Date   bariatric bypass  2012   BIOPSY  05/03/2020   Procedure: BIOPSY;  Surgeon: Rachael Fee, MD;  Location: [facility] ENDOSCOPY;  Service: Endoscopy;;   CARPAL TUNNEL RELEASE Right 2003   CARPAL TUNNEL RELEASE Right    2008   CHOLECYSTECTOMY  1975   COLONOSCOPY     COLONOSCOPY WITH PROPOFOL N/A 05/03/2020   Procedure: COLONOSCOPY WITH PROPOFOL;  Surgeon: Rachael Fee, MD;  Location: [facility] ENDOSCOPY;  Service: Endoscopy;  Laterality: N/A;   CYSTOSCOPY W/ RETROGRADES Bilateral 06/06/2015   Procedure: CYSTOSCOPY WITH RETROGRADE PYELOGRAM;  Surgeon: Jerilee Field, MD;  Location: [facility] ORS;  Service: Urology;  Laterality: Bilateral;   EYE SURGERY Left 07/06/2022   FL INJ LEFT KNEE CT ARTHROGRAM ([facility] HX) Left    1995   GASTRIC BYPASS  2010   HAND SURGERY Left 01/19/2021   Thumb   HEMORRHOID SURGERY  2013   KNEE ARTHROSCOPY Left 1996   TONSILLECTOMY  TOTAL ABDOMINAL HYSTERECTOMY W/ BILATERAL SALPINGOOPHORECTOMY       A IV Location/Drains/Wounds Patient Lines/Drains/Airways Status     Active Line/Drains/Airways     Name Placement date Placement time Site Days   Peripheral IV 02/05/23 18 G 1.16" Left Forearm 02/05/23  0450  Forearm  less than 1   Peripheral IV 02/05/23 18 G 1.16" Right Forearm 02/05/23  0510  Forearm  less than 1            Intake/Output Last 24 hours  Intake/Output Summary (Last 24 hours) at 02/05/2023 1007 Last data filed at 02/05/2023 1610 Gross per 24 hour  Intake 2100.33 ml  Output --  Net 2100.33 ml    Labs/Imaging Results for orders  placed or performed during the hospital encounter of 02/04/23 (from the past 48 hour(s))  Urinalysis, Routine w reflex microscopic -Urine, Clean Catch     Status: Abnormal   Collection Time: 02/04/23  5:44 PM  Result Value Ref Range   Color, Urine YELLOW YELLOW   APPearance CLEAR CLEAR   Specific Gravity, Urine 1.006 1.005 - 1.030   pH 5.0 5.0 - 8.0   Glucose, UA NEGATIVE NEGATIVE mg/dL   Hgb urine dipstick MODERATE (A) NEGATIVE   Bilirubin Urine NEGATIVE NEGATIVE   Ketones, ur NEGATIVE NEGATIVE mg/dL   Protein, ur NEGATIVE NEGATIVE mg/dL   Nitrite NEGATIVE NEGATIVE   Leukocytes,Ua MODERATE (A) NEGATIVE   RBC / HPF 6-10 0 - 5 RBC/hpf   WBC, UA 11-20 0 - 5 WBC/hpf   Bacteria, UA RARE (A) NONE SEEN   Squamous Epithelial / HPF 0-5 0 - 5 /HPF   Mucus PRESENT     Comment: Performed at [facility], [address] [place], [place] [address]  Lipase, blood     Status: None   Collection Time: 02/04/23  5:58 PM  Result Value Ref Range   Lipase 32 11 - 51 U/L    Comment: Performed at [facility], [address] [place], [place] [address]  Comprehensive metabolic panel     Status: Abnormal   Collection Time: 02/04/23  5:58 PM  Result Value Ref Range   Sodium 132 (L) 135 - 145 mmol/L   Potassium 3.8 3.5 - 5.1 mmol/L   Chloride 99 98 - 111 mmol/L   CO2 21 (L) 22 - 32 mmol/L   Glucose, Bld 159 (H) 70 - 99 mg/dL    Comment: Glucose reference range applies only to samples taken after fasting for at least 8 hours.   BUN 18 8 - 23 mg/dL   Creatinine, Ser 1.91 (H) 0.44 - 1.00 mg/dL   Calcium 8.6 (L) 8.9 - 10.3 mg/dL   Total Protein 6.5 6.5 - 8.1 g/dL   Albumin 3.0 (L) 3.5 - 5.0 g/dL   AST 31 15 - 41 U/L   ALT 24 0 - 44 U/L   Alkaline Phosphatase 205 (H) 38 - 126 U/L   Total Bilirubin 0.4 0.3 - 1.2 mg/dL   GFR, Estimated 44 (L) >60 mL/min    Comment: (NOTE) Calculated using the CKD-EPI Creatinine Equation (2021)    Anion gap 12 5 - 15    Comment: Performed at [facility], [address] [place], [place] [address]  CBC     Status: None   Collection Time: 02/04/23  5:58 PM  Result Value Ref Range   WBC 8.2 4.0 - 10.5 K/uL   RBC 4.46 3.87 - 5.11 MIL/uL  Hemoglobin 12.5 12.0 - 15.0 g/dL   HCT 16.1 09.6 - 04.5 %   MCV 88.6 80.0 - 100.0 fL   MCH 28.0 26.0 - 34.0 pg   MCHC 31.6 30.0 - 36.0 g/dL   RDW 40.9 81.1 - 91.4 %   Platelets 346 150 - 400 K/uL   nRBC 0.0 0.0 - 0.2 %    Comment: Performed at [facility], [address], [place], [place] [address]  CBG monitoring, ED     Status: Abnormal   Collection Time: 02/04/23 10:35 PM  Result Value Ref Range   Glucose-Capillary 158 (H) 70 - 99 mg/dL    Comment: Glucose reference range applies only to samples taken after fasting for at least 8 hours.  CBG monitoring, ED     Status: Abnormal   Collection Time: 02/05/23  2:25 AM  Result Value Ref Range   Glucose-Capillary 240 (H) 70 - 99 mg/dL    Comment: Glucose reference range applies only to samples taken after fasting for at least 8 hours.  Lactic acid, plasma     Status: Abnormal   Collection Time: 02/05/23  4:50 AM  Result Value Ref Range   Lactic Acid, Venous 2.1 (HH) 0.5 - 1.9 mmol/L    Comment: CRITICAL RESULT CALLED TO, READ BACK BY AND VERIFIED WITH [place], A. RN @ [phone] 02/05/23 JBUTLER Performed at [facility], [address] [place], [place] [address]   CK     Status: None   Collection Time: 02/05/23  4:50 AM  Result Value Ref Range   Total CK 72 38 - 234 U/L    Comment: Performed at [facility], [address], [place], [place] [address]  Troponin I (High Sensitivity)     Status: Abnormal   Collection Time: 02/05/23  4:50 AM  Result Value Ref Range   Troponin I (High Sensitivity) 31 (H) <18 ng/L    Comment: (NOTE) Elevated high sensitivity troponin I (hsTnI) values and significant  changes across serial measurements may suggest ACS but many other  chronic and acute conditions are known to elevate hsTnI results.   Refer to the "Links" section for chest pain algorithms and additional  guidance. Performed at [facility], [address], [place], [place] [address]   Lactic acid, plasma     Status: None   Collection Time: 02/05/23  6:22 AM  Result Value Ref Range   Lactic Acid, Venous 1.8 0.5 - 1.9 mmol/L    Comment: Performed at [facility], [address], [place], [place] [address]  Troponin I (High Sensitivity)     Status: Abnormal   Collection Time: 02/05/23  6:49 AM  Result Value Ref Range   Troponin I (High Sensitivity) 51 (H) <18 ng/L    Comment: RESULT CALLED TO, READ BACK BY AND VERIFIED WITH Macallister Ashmead RN.@[email or web address]  ON 7.9.24 BY TCALDWELL MT. (NOTE) Elevated high sensitivity troponin I (hsTnI) values and significant  changes across serial measurements may suggest ACS but many other  chronic and acute conditions are known to elevate hsTnI results.  Refer to the "Links" section for chest pain algorithms and additional  guidance. Performed at [facility], [address] [place], [place] [address]    DG Chest 2 View  Result Date: 02/05/2023 CLINICAL DATA:  67 year old female with vomiting, weakness. EXAM: CHEST - 2 VIEW COMPARISON:  Chest radiograph 09/26/2022 and earlier. FINDINGS: Seated upright AP and lateral views of the chest  at 0638 hours. Lung volumes and mediastinal contours remain normal. Visualized tracheal air column is within normal limits. Lung markings do not appear significantly changed from 2013 radiographs, with no pneumothorax, pulmonary edema, pleural effusion, or confluent lung opacity. Subacute or chronic appearing deformity of the left humeral neck. Periosteal new bone formation there. Fracture there was demonstrated on left humerus series 12/21/2022. No new osseous abnormality identified. Negative visible bowel gas. IMPRESSION: 1. No acute cardiopulmonary abnormality. 2. Proximal left humerus fracture which occurred in May. Electronically Signed   By: Odessa Fleming M.D.   On: 02/05/2023 06:53   CT HEAD WO CONTRAST ( )  Result Date: 02/05/2023 CLINICAL DATA:  Multiple falls, facial trauma EXAM: CT HEAD WITHOUT CONTRAST TECHNIQUE: Contiguous axial images were obtained from the base of the skull through the vertex without intravenous contrast. RADIATION DOSE REDUCTION: This exam was performed according to the departmental dose-optimization program which includes automated exposure control, adjustment of the mA and/or kV according to patient size and/or use of iterative reconstruction technique. COMPARISON:  12/14/2022 CTA head and neck FINDINGS: Brain: No evidence of acute infarction, hemorrhage, mass, mass effect, or midline shift. No hydrocephalus or extra-axial fluid collection. Periventricular white matter changes, likely the sequela of chronic small vessel ischemic disease. Vascular: No hyperdense vessel. Atherosclerotic calcifications in the intracranial carotid and vertebral arteries. Skull: Negative for fracture or focal lesion. Sinuses/Orbits: No acute finding. Other: The mastoid air cells are well aerated. IMPRESSION: No acute intracranial process. Electronically Signed   By: Wiliam Ke M.D.   On: 02/05/2023 03:17   CT Renal Stone Study  Result Date: 02/05/2023 CLINICAL DATA:  Abdominal and flank pain vomiting and chills. Evaluate for pancreatitis. EXAM: CT ABDOMEN AND PELVIS WITHOUT CONTRAST TECHNIQUE: Multidetector CT imaging of the abdomen and pelvis was performed following the standard protocol without IV contrast. RADIATION DOSE REDUCTION: This exam was performed according to the departmental dose-optimization program which includes automated exposure control, adjustment of the mA and/or kV according to patient size and/or use of iterative reconstruction technique. COMPARISON:  CT abdomen and pelvis 11/13/2022 FINDINGS: Lower chest: No acute abnormality. Hepatobiliary: No focal liver abnormality is seen. Status post cholecystectomy. No biliary  dilatation. Pancreas: Unremarkable. No pancreatic ductal dilatation or surrounding inflammatory changes. Spleen: Normal in size without focal abnormality. Adrenals/Urinary Tract: There is a 6 mm calculus in the left kidney. There is no hydronephrosis or perinephric fluid. Otherwise, the kidneys, adrenal glands, and bladder are within normal limits. 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 There is no evidence for bowel obstruction, pneumatosis or free air. The appendix is not visualized. There is a large amount of stool throughout the colon. Scattered colonic diverticula are present. Vascular/Lymphatic: Aortic atherosclerosis. No enlarged abdominal or pelvic lymph nodes. Reproductive: Status post hysterectomy. No adnexal masses. Other: No abdominal wall hernia or abnormality. No abdominopelvic ascites. 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 L2-L3 degenerative endplate changes are similar to the prior study. IMPRESSION: 1. No acute localizing process in the abdomen or pelvis. 2. Nonobstructing left renal calculus. 3. Gastric bypass changes. Aortic Atherosclerosis (ICD10-I70.0). Electronically Signed   By: Darliss Cheney M.D.   On: 02/05/2023 02:27   DG Abdomen 1 View  Result Date: 02/04/2023 CLINICAL DATA:  Evaluate for obstruction.  Vomiting EXAM: ABDOMEN - 1 VIEW COMPARISON:  None Available. FINDINGS: Nonobstructive bowel gas pattern. No organomegaly or free air. No suspicious calcification or acute bony abnormality. Levoscoliosis in the upper lumbar spine. IMPRESSION: No evidence of bowel obstruction or  free air. Electronically Signed   By: Charlett Nose M.D.   On: 02/04/2023 19:00    Pending Labs Unresulted Labs (From admission, onward)     Start     Ordered   02/06/23 0500  Comprehensive metabolic panel  Tomorrow morning,   R        02/05/23 0937   02/06/23 0500  CBC  Tomorrow morning,   R        02/05/23 0937   02/05/23 0936  HIV Antibody (routine testing w  rflx)  (HIV Antibody (Routine testing w reflex) panel)  Once,   R        02/05/23 0937   02/05/23 0805  Rapid urine drug screen (hospital performed)  ONCE - STAT,   STAT        02/05/23 0804   02/05/23 0422  Blood culture (routine x 2)  BLOOD CULTURE X 2,   R (with STAT occurrences)      02/05/23 0421            Vitals/Pain Today's Vitals   02/04/23 2152 02/04/23 2232 02/05/23 0410 02/05/23 0743  BP: (!) 183/105 (!) 165/111 (!) 163/117 (!) 183/116  Pulse: (!) 104 (!) 109 (!) 113 (!) 121  Resp: 16 (!) 24 (!) 21 18  Temp: 98.4 F (36.9 C)  98.9 F (37.2 C) 98 F (36.7 C)  TempSrc:   Oral Oral  SpO2: 99% 99% 91% 99%  Weight:      Height:      PainSc:        Isolation Precautions No active isolations  Medications Medications  ondansetron (ZOFRAN) injection 4 mg (4 mg Intravenous Given 02/05/23 0801)  enoxaparin (LOVENOX) injection 30 mg (has no administration in time range)  sodium chloride flush (NS) 0.9 % injection 3 mL (has no administration in time range)  lactated ringers infusion (has no administration in time range)  acetaminophen (TYLENOL) tablet 650 mg (has no administration in time range)    Or  acetaminophen (TYLENOL) suppository 650 mg (has no administration in time range)  polyethylene glycol (MIRALAX / GLYCOLAX) packet 17 g (has no administration in time range)  bisacodyl (DULCOLAX) EC tablet 5 mg (has no administration in time range)  hydrALAZINE (APRESOLINE) injection 5 mg (has no administration in time range)  LORazepam (ATIVAN) injection 0.5 mg (has no administration in time range)  ondansetron (ZOFRAN-ODT) disintegrating tablet 4 mg (4 mg Oral Given 02/04/23 1837)  lactated ringers bolus 1,000 mL ( Intravenous Stopped 02/05/23 0614)  lactated ringers bolus 1,000 mL ( Intravenous Stopped 02/05/23 0614)  ondansetron (ZOFRAN) injection 4 mg (4 mg Intravenous Given 02/05/23 0451)  fentaNYL (SUBLIMAZE) injection 50 mcg (50 mcg Intravenous Given 02/05/23 0453)   cefTRIAXone (ROCEPHIN) 1 g in sodium chloride 0.9 % 100 mL IVPB (0 g Intravenous Stopped 02/05/23 0552)  promethazine (PHENERGAN) injection 12.5 mg (12.5 mg Intramuscular Given 02/05/23 0649)  LORazepam (ATIVAN) injection 0.5 mg (0.5 mg Intravenous Given 02/05/23 0843)    Mobility walks     Focused Assessments GI focused  R Recommendations: See Admitting Provider Note  Report given to:   Additional Notes:

## 2023-02-06 DIAGNOSIS — N179 Acute kidney failure, unspecified: Secondary | ICD-10-CM | POA: Diagnosis not present

## 2023-02-06 DIAGNOSIS — R1310 Dysphagia, unspecified: Secondary | ICD-10-CM | POA: Diagnosis not present

## 2023-02-06 DIAGNOSIS — R112 Nausea with vomiting, unspecified: Secondary | ICD-10-CM

## 2023-02-06 DIAGNOSIS — Z91148 Patient's other noncompliance with medication regimen for other reason: Secondary | ICD-10-CM

## 2023-02-06 DIAGNOSIS — F319 Bipolar disorder, unspecified: Secondary | ICD-10-CM

## 2023-02-06 DIAGNOSIS — K5100 Ulcerative (chronic) pancolitis without complications: Secondary | ICD-10-CM | POA: Diagnosis not present

## 2023-02-06 DIAGNOSIS — F419 Anxiety disorder, unspecified: Secondary | ICD-10-CM

## 2023-02-06 DIAGNOSIS — F411 Generalized anxiety disorder: Secondary | ICD-10-CM

## 2023-02-06 LAB — CBC
HCT: 36.4 % (ref 36.0–46.0)
Hemoglobin: 12 g/dL (ref 12.0–15.0)
MCH: 28 pg (ref 26.0–34.0)
MCHC: 33 g/dL (ref 30.0–36.0)
MCV: 84.8 fL (ref 80.0–100.0)
Platelets: 331 10*3/uL (ref 150–400)
RBC: 4.29 MIL/uL (ref 3.87–5.11)
RDW: 13.5 % (ref 11.5–15.5)
WBC: 6 10*3/uL (ref 4.0–10.5)
nRBC: 0 % (ref 0.0–0.2)

## 2023-02-06 LAB — COMPREHENSIVE METABOLIC PANEL
ALT: 21 U/L (ref 0–44)
AST: 26 U/L (ref 15–41)
Albumin: 2.9 g/dL — ABNORMAL LOW (ref 3.5–5.0)
Alkaline Phosphatase: 193 U/L — ABNORMAL HIGH (ref 38–126)
Anion gap: 9 (ref 5–15)
BUN: <5 mg/dL — ABNORMAL LOW (ref 8–23)
CO2: 24 mmol/L (ref 22–32)
Calcium: 8.7 mg/dL — ABNORMAL LOW (ref 8.9–10.3)
Chloride: 101 mmol/L (ref 98–111)
Creatinine, Ser: 0.67 mg/dL (ref 0.44–1.00)
GFR, Estimated: >60 mL/min (ref >60)
Glucose, Bld: 165 mg/dL — ABNORMAL HIGH (ref 70–99)
Potassium: 3.1 mmol/L — ABNORMAL LOW (ref 3.5–5.1)
Sodium: 134 mmol/L — ABNORMAL LOW (ref 135–145)
Total Bilirubin: 0.6 mg/dL (ref 0.3–1.2)
Total Protein: 6 g/dL — ABNORMAL LOW (ref 6.5–8.1)

## 2023-02-06 LAB — RAPID URINE DRUG SCREEN, HOSP PERFORMED
Amphetamines: NOT DETECTED
Barbiturates: NOT DETECTED
Benzodiazepines: POSITIVE — AB
Cocaine: NOT DETECTED
Opiates: NOT DETECTED
Tetrahydrocannabinol: NOT DETECTED

## 2023-02-06 LAB — GLUCOSE, CAPILLARY
Glucose-Capillary: 157 mg/dL — ABNORMAL HIGH (ref 70–99)
Glucose-Capillary: 161 mg/dL — ABNORMAL HIGH (ref 70–99)

## 2023-02-06 MED ORDER — BOOST / RESOURCE BREEZE PO LIQD CUSTOM: 1.0000 | Freq: Three times a day (TID) | ORAL | Status: DC

## 2023-02-06 MED ORDER — INSULIN ASPART 100 UNIT/ML IJ SOLN: 0.0000 [IU] | Freq: Every day | INTRAMUSCULAR | Status: DC

## 2023-02-06 MED ORDER — LORAZEPAM 2 MG/ML IJ SOLN
1.0000 mg | INTRAMUSCULAR | Status: DC | PRN
  Administered 2023-02-06: 1 mg via INTRAVENOUS
  Administered 2023-02-07: 2 mg via INTRAVENOUS
  Filled 2023-02-06 (×2): qty 1

## 2023-02-06 MED ORDER — THIAMINE HCL 100 MG/ML IJ SOLN: 100.0000 mg | Freq: Every day | INTRAMUSCULAR | Status: DC

## 2023-02-06 MED ORDER — METOPROLOL TARTRATE 25 MG PO TABS
25.0000 mg | ORAL_TABLET | Freq: Two times a day (BID) | ORAL | Status: DC
  Administered 2023-02-06 – 2023-02-08 (×5): 25 mg via ORAL
  Filled 2023-02-06 (×5): qty 1

## 2023-02-06 MED ORDER — INSULIN ASPART 100 UNIT/ML IJ SOLN
0.0000 [IU] | Freq: Three times a day (TID) | INTRAMUSCULAR | Status: DC
  Administered 2023-02-07: 3 [IU] via SUBCUTANEOUS
  Administered 2023-02-08: 2 [IU] via SUBCUTANEOUS

## 2023-02-06 MED ORDER — METOPROLOL TARTRATE 5 MG/5ML IV SOLN
5.0000 mg | Freq: Once | INTRAVENOUS | Status: AC
  Administered 2023-02-06: 5 mg via INTRAVENOUS
  Filled 2023-02-06: qty 5

## 2023-02-06 MED ORDER — FOLIC ACID 1 MG PO TABS
1.0000 mg | ORAL_TABLET | Freq: Every day | ORAL | Status: DC
  Administered 2023-02-06 – 2023-02-08 (×3): 1 mg via ORAL
  Filled 2023-02-06 (×3): qty 1

## 2023-02-06 MED ORDER — LORAZEPAM 1 MG PO TABS
1.0000 mg | ORAL_TABLET | Freq: Every morning | ORAL | Status: DC
  Administered 2023-02-07 – 2023-02-08 (×2): 1 mg via ORAL
  Filled 2023-02-06 (×2): qty 1

## 2023-02-06 MED ORDER — ADULT MULTIVITAMIN W/MINERALS CH
1.0000 | ORAL_TABLET | Freq: Every day | ORAL | Status: DC
  Administered 2023-02-06 – 2023-02-08 (×3): 1 via ORAL
  Filled 2023-02-06 (×3): qty 1

## 2023-02-06 MED ORDER — LORAZEPAM 1 MG PO TABS
1.0000 mg | ORAL_TABLET | ORAL | Status: DC | PRN
  Administered 2023-02-06 – 2023-02-07 (×2): 1 mg via ORAL
  Filled 2023-02-06 (×3): qty 1

## 2023-02-06 MED ORDER — POTASSIUM CHLORIDE CRYS ER 20 MEQ PO TBCR
40.0000 meq | EXTENDED_RELEASE_TABLET | Freq: Once | ORAL | Status: AC
  Administered 2023-02-06: 40 meq via ORAL
  Filled 2023-02-06: qty 2

## 2023-02-06 MED ORDER — LORAZEPAM 1 MG PO TABS
1.5000 mg | ORAL_TABLET | Freq: Every day | ORAL | Status: DC
  Administered 2023-02-06 – 2023-02-07 (×2): 1.5 mg via ORAL
  Filled 2023-02-06 (×2): qty 1

## 2023-02-06 MED ORDER — THIAMINE MONONITRATE 100 MG PO TABS
100.0000 mg | ORAL_TABLET | Freq: Every day | ORAL | Status: DC
  Administered 2023-02-06 – 2023-02-08 (×3): 100 mg via ORAL
  Filled 2023-02-06 (×3): qty 1

## 2023-02-06 NOTE — Progress Notes (Signed)
Initial Nutrition Assessment  DOCUMENTATION CODES:   Not applicable  INTERVENTION:  - Add Boost Breeze po TID, each supplement provides 250 kcal and 9 grams of protein  NUTRITION DIAGNOSIS:   Inadequate oral intake related to inability to eat, dysphagia as evidenced by per patient/family report.  GOAL:   Patient will meet greater than or equal to 90% of their needs  MONITOR:   PO intake, Diet advancement, Supplement acceptance  REASON FOR ASSESSMENT:   Consult Assessment of nutrition requirement/status  ASSESSMENT:   67 y.o. female admits related to flank pain. PMH includes: CHF, colitis, DM, and TIA. Pt is is currently receiving medical management related to AKI.  Meds reviewed: folic acid, MVI, Klor-con, miralax, klor-con, senokot, thiamine. Labs reviewed: Na low, K low, BUN low.   RD attempted to call pt's room but no answer. Per record, pt has experienced a 7% wt loss in the past 3 months which does not meet the criteria for malnutrition. Per H&P note, pt with dysphagia, nausea, vomiting and colitis. Pt is currently on a CL diet. RD will add Boost Breeze for now. Will continue to monitor diet advancement and attempt to gather nutrition hx details and at follow up.   NUTRITION - FOCUSED PHYSICAL EXAM:  Remote assessment.   Diet Order:   Diet Order             Diet clear liquid Room service appropriate? Yes; Fluid consistency: Thin  Diet effective now                   EDUCATION NEEDS:   Not appropriate for education at this time  Skin:  Skin Assessment: Reviewed RN Assessment  Last BM:  7/9  Height:   Ht Readings from Last 1 Encounters:  02/04/23 5' (1.524 m)    Weight:   Wt Readings from Last 1 Encounters:  02/04/23 49.4 kg    Ideal Body Weight:     BMI:  Body mass index is 21.29 kg/m.  Estimated Nutritional Needs:   Kcal:  1235-1500 kcals  Protein:  60-75 gm  Fluid:  >/= 1.2 L  Bethann Humble, RD, LDN, CNSC.

## 2023-02-06 NOTE — Telephone Encounter (Signed)
Okay thanks for letting me know. Looks like she is admitted and our team was consulted on her case and will see her.

## 2023-02-06 NOTE — Consult Note (Signed)
Consultation  Referring Provider: TRH/ Yates Primary Care Physician:  [name], NP Primary Gastroenterologist:  Dr.Armbruster  Reason for Consultation: Dysphagia, nausea vomiting, weight loss back pain  HPI: Amy Hansen is a 67 y.o. female, known to Dr. 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  Last seen in our office in April 2024 and at that time had been complaining of persistent weight loss.  She had been on Mounjaro over the past year and she was to discuss stopping this with her PCP.  Also known to have mildly elevated LFTs with elevated alk phos and minimal transaminase elevation.  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  Patient has not been doing well over the past few weeks, related that she been having intermittent problems with nausea and vomiting and poor appetite over the past several months, now down to 108 pounds.  Her nausea and vomiting had worsened over the past couple of weeks to being to the point of daily with significant decreased oral intake.  She was also having difficulty keeping down her pills as she would throw them back up.  She had fallen and fractured her arm about 6 weeks ago, had been taking narcotic analgesic fairly regularly for pain over the past 5 weeks.  She says she has fallen 3 times at home over the past couple of weeks 2 of these landing flat on her back. She finally stopped Mounjaro 2 days ago, had taken a dose the week before.  She also stopped taking the hydrocodone. Initially gone to urgent care yesterday and was then directed to the emergency room.  She says she has history of recurrent cystitis and had recently taken a 10-day course of doxycycline.  Her primary complaint is bilateral lower back pain which has worsened over the past week, in addition to the ongoing daily  nausea and vomiting symptoms.  She has not been having any diarrhea, more on the constipated side not noting any melena or hematochezia.  No documented fever at home but had been having chills.  On discussion regarding dysphagia she says she has had some dysphagia symptoms for a few years.  She only notes sensation of being unable to make a swallowing motion which occurs intermittently usually after she is lying down in bed at night.  Sometimes she has to jump up because she panics when this happens and will get a glass of water to drink.  She says this may occur a couple of times and 1 night when she has the symptoms and then may go for weeks without symptoms.  She denies any difficulty with solids or liquids during the daytime with dysphagia. Did have an EGD in fall 2023 with a normal-appearing esophagus, had mild reactive gastropathy no H. pylori.  Last colonoscopy May 2023 with mildly active colitis, and Entyvio dosing interval was decreased at that time.  In the ER yesterday with CT of the abdomen pelvis noncontrasted shows a 6 mm stone in the left kidney, no pyelonephritis obvious she is status post gastric bypass, noted to have increased stool in the colon  Labs 02/04/2023--- WBC 8.2, globin 12.5/hematocrit 39.5 Sodium 132/potassium 3.8/BUN 18/creatinine 1.34, albumin 3.0 Alk phos 205/AST 31/ALT 24/T. bili 0.4 Lipase within normal limits UA abnormal with moderate leukocytes/6-10 RBCs/11-12 WBCs, culture pending Blood culture pending Tate 2.1 on admit Rug screen  pending CRP 1.6/fecal calprotectin needs to be collected  Chest x-ray negative noting proximal left humerus fracture  Past Medical History:  Diagnosis Date   Anemia    Anxiety    Arthritis    Blood transfusion without reported diagnosis    Cataract    CHF (congestive heart failure) (HCC)    Chronic kidney disease    UTI, hematuria in urine   Colitis    Crohn's disease (HCC)    Depression    Diabetes (HCC)     Diverticulosis    Frequent headaches    Interstitial cystitis    Recurrent UTI    Restless leg syndrome    TIA (transient ischemic attack) 02/20/2021   Urinary frequency     Past Surgical History:  Procedure Laterality Date   bariatric bypass  2012   BIOPSY  05/03/2020   Procedure: BIOPSY;  Surgeon: Rachael Fee, MD;  Location: [facility] ENDOSCOPY;  Service: Endoscopy;;   CARPAL TUNNEL RELEASE Right 2003   CARPAL TUNNEL RELEASE Right    2008   CHOLECYSTECTOMY  1975   COLONOSCOPY     COLONOSCOPY WITH PROPOFOL N/A 05/03/2020   Procedure: COLONOSCOPY WITH PROPOFOL;  Surgeon: Rachael Fee, MD;  Location: [facility] ENDOSCOPY;  Service: Endoscopy;  Laterality: N/A;   CYSTOSCOPY W/ RETROGRADES Bilateral 06/06/2015   Procedure: CYSTOSCOPY WITH RETROGRADE PYELOGRAM;  Surgeon: Jerilee Field, MD;  Location: [facility] ORS;  Service: Urology;  Laterality: Bilateral;   EYE SURGERY Left 07/06/2022   FL INJ LEFT KNEE CT ARTHROGRAM ([facility] HX) Left    1995   GASTRIC BYPASS  2010   HAND SURGERY Left 01/19/2021   Thumb   HEMORRHOID SURGERY  2013   KNEE ARTHROSCOPY Left 1996   TONSILLECTOMY     TOTAL ABDOMINAL HYSTERECTOMY W/ BILATERAL SALPINGOOPHORECTOMY      Prior to Admission medications   Medication Sig Start Date End Date Taking? Authorizing Provider  ALPRAZolam Prudy Feeler) 0.5 MG tablet 1 po q am, and 1.5 po qhs 01/08/23  Yes Hurst, Glade Nurse, PA-C  aspirin EC 81 MG tablet Take 1 tablet (81 mg) by mouth once daily. Swallow whole.   Yes [provider]  buPROPion (WELLBUTRIN XL) 150 MG 24 hr tablet Take 3 tablets (450 mg total) by mouth daily. 11/30/21  Yes Melony Overly T, PA-C  conjugated estrogens (PREMARIN) vaginal cream Apply one pea-sized amount around the opening of the urethra daily for 2 weeks, then 3 times weekly moving forward. 09/19/22  Yes Vaillancourt, Lelon Mast, PA-C  furosemide (LASIX) 20 MG tablet Take 1 tablet (20 mg total) by mouth daily. May take an additional tablet (20mg ) AS  NEEDED for weight gain and/or swelling. 08/13/22 08/08/23 Yes Dunn, Raymon Mutton, PA-C  lamoTRIgine (LAMICTAL) 200 MG tablet Take 2 tablets (400 mg total) by mouth 2 (two) times daily. 10/18/22  Yes Hurst, Rosey Bath T, PA-C  ondansetron (ZOFRAN-ODT) 8 MG disintegrating tablet Take 1 tablet (8 mg total) by mouth every 8 (eight) hours as needed for nausea or vomiting. 10/30/22  Yes [name], NP  pramipexole (MIRAPEX) 1 MG tablet Take 1 mg by mouth at bedtime. 11/03/22  Yes [provider]  promethazine (PHENERGAN) 25 MG tablet Take 1 tablet (25 mg total) by mouth every 8 (eight) hours as needed for nausea or vomiting. 07/04/22  Yes Armbruster, Willaim Rayas, MD  QUEtiapine (SEROQUEL) 300 MG tablet Take 1 tablet (300 mg total) by mouth at bedtime. 10/18/22  Yes Hurst, Rosey Bath T, PA-C  rOPINIRole (REQUIP) 1 MG  tablet Take 1 tablet (1 mg total) by mouth at bedtime. 11/05/22  Yes [name], NP  rosuvastatin (CRESTOR) 5 MG tablet Take 1 tablet by mouth once daily 11/30/22  Yes [name], NP  senna-docusate (SENOKOT-S) 8.6-50 MG tablet Take 1 tablet by mouth daily. 12/21/22  Yes Theresia Lo, Benetta Spar K, DO  sertraline (ZOLOFT) 100 MG tablet Take 2.5 tablets (250 mg total) by mouth daily. 01/08/23  Yes Hurst, Teresa T, PA-C  vedolizumab (ENTYVIO) 300 MG injection Inject into the vein. Every 4 weeks   Yes [provider]    Current Facility-Administered Medications  Medication Dose Route Frequency Provider Last Rate Last Admin   acetaminophen (TYLENOL) tablet 650 mg  650 mg Oral Q6H PRN Jonah Blue, MD   650 mg at 02/05/23 2040   Or   acetaminophen (TYLENOL) suppository 650 mg  650 mg Rectal Q6H PRN Jonah Blue, MD       ALPRAZolam Prudy Feeler) tablet 0.5 mg  0.5 mg Oral Daily Jonah Blue, MD   0.5 mg at 02/06/23 1028   ALPRAZolam (XANAX) tablet 0.75 mg  0.75 mg Oral QHS Pham, Minh Q, RPH-CPP   0.75 mg at 02/05/23 2039   aspirin EC tablet 81 mg  81 mg Oral Daily Jonah Blue, MD   81 mg  at 02/06/23 1028   bisacodyl (DULCOLAX) EC tablet 5 mg  5 mg Oral Daily PRN Jonah Blue, MD       folic acid (FOLVITE) tablet 1 mg  1 mg Oral Daily Julian Reil, Jared M, DO   1 mg at 02/06/23 1028   hydrALAZINE (APRESOLINE) injection 5 mg  5 mg Intravenous Q4H PRN Jonah Blue, MD   5 mg at 02/05/23 1132   lactated ringers infusion   Intravenous Continuous Jonah Blue, MD 75 mL/hr at 02/06/23 0542 Infusion Verify at 02/06/23 0542   LORazepam (ATIVAN) tablet 1-4 mg  1-4 mg Oral Q1H PRN Hillary Bow, DO   1 mg at 02/06/23 2956   Or   LORazepam (ATIVAN) injection 1-4 mg  1-4 mg Intravenous Q1H PRN Hillary Bow, DO   1 mg at 02/06/23 0111   multivitamin with minerals tablet 1 tablet  1 tablet Oral Daily Lyda Perone M, DO   1 tablet at 02/06/23 1028   ondansetron (ZOFRAN) injection 4 mg  4 mg Intravenous Q6H PRN Jonah Blue, MD   4 mg at 02/06/23 1024   polyethylene glycol (MIRALAX / GLYCOLAX) packet 17 g  17 g Oral BID Jonah Blue, MD   17 g at 02/06/23 1028   pramipexole (MIRAPEX) tablet 1 mg  1 mg Oral QHS Jonah Blue, MD   1 mg at 02/05/23 2039   QUEtiapine (SEROQUEL) tablet 100 mg  100 mg Oral QHS Cinderella, Margaret A   100 mg at 02/05/23 2040   rosuvastatin (CRESTOR) tablet 5 mg  5 mg Oral Daily Jonah Blue, MD   5 mg at 02/06/23 1028   senna-docusate (Senokot-S) tablet 1 tablet  1 tablet Oral Daily Jonah Blue, MD   1 tablet at 02/06/23 1028   sodium chloride flush (NS) 0.9 % injection 3 mL  3 mL Intravenous Steva Colder, MD   3 mL at 02/06/23 1029   thiamine (VITAMIN B1) tablet 100 mg  100 mg Oral Daily Lyda Perone M, DO   100 mg at 02/06/23 1028   Or   thiamine (VITAMIN B1) injection 100 mg  100 mg Intravenous Daily Hillary Bow, DO  Allergies as of 02/04/2023 - Review Complete 02/04/2023  Allergen Reaction Noted   Avelox [moxifloxacin hcl in nacl] Anaphylaxis 04/19/2015   Bactrim [sulfamethoxazole-trimethoprim] Anaphylaxis  06/17/2018   Ciprofloxacin Other (See Comments) 10/15/2018   Buspar [buspirone] Other (See Comments) 08/30/2022   Neomycin-polymyxin-dexameth Other (See Comments) 05/07/2022   Aquaphor [lanolin-petrolatum] Itching and Rash 08/13/2022   Depakote [divalproex sodium] Other (See Comments) 06/01/2018   Imitrex [sumatriptan] Other (See Comments) 05/21/2017   Stadol [butorphanol] Rash 04/19/2015    Family History  Problem Relation Age of Onset   Colon cancer Mother    Stroke Father    Heart failure Sister    Bladder Cancer Neg Hx    Kidney disease Neg Hx    Prostate cancer Neg Hx    Kidney cancer Neg Hx    Pancreatic cancer Neg Hx    Esophageal cancer Neg Hx    Stomach cancer Neg Hx    Rectal cancer Neg Hx    Breast cancer Neg Hx     Social History   Socioeconomic History   Marital status: Married    Spouse name: Not on file   Number of children: Not on file   Years of education: Not on file   Highest education level: Not on file  Occupational History   Not on file  Tobacco Use   Smoking status: Former    Packs/day: .5    Types: Cigarettes    Quit date: 04/25/1975    Years since quitting: 47.8   Smokeless tobacco: Never   Tobacco comments:    quit 40 years ago  Vaping Use   Vaping Use: Never used  Substance and Sexual Activity   Alcohol use: No    Alcohol/week: 0.0 standard drinks of alcohol   Drug use: No   Sexual activity: Not Currently    Birth control/protection: Post-menopausal, Surgical  Other Topics Concern   Not on file  Social History Narrative   Caffeine 5 servings per day.   Social Determinants of Health   Financial Resource Strain: Medium Risk (04/12/2021)   Overall Financial Resource Strain (CARDIA)    Difficulty of Paying Living Expenses: Somewhat hard  Food Insecurity: No Food Insecurity (02/05/2023)   Hunger Vital Sign    Worried About Running Out of Food in the Last Year: Never true    Ran Out of Food in the Last Year: Never true   Transportation Needs: No Transportation Needs (02/05/2023)   PRAPARE - Administrator, Civil Service (Medical): No    Lack of Transportation (Non-Medical): No  Physical Activity: Inactive (04/12/2021)   Exercise Vital Sign    Days of Exercise per Week: 0 days    Minutes of Exercise per Session: 0 min  Stress: No Stress Concern Present (03/06/2022)   [employer] of Occupational Health - Occupational Stress Questionnaire    Feeling of Stress : Only a little  Social Connections: Moderately Integrated (03/06/2022)   Social Connection and Isolation Panel [NHANES]    Frequency of Communication with Friends and Family: More than three times a week    Frequency of Social Gatherings with Friends and Family: More than three times a week    Attends Religious Services: More than 4 times per year    Active Member of [employer] or Organizations: No    Attends Banker Meetings: Never    Marital Status: Married  Catering manager Violence: Not At Risk (02/05/2023)   Humiliation, Afraid, Rape, and Kick questionnaire  Fear of Current or Ex-Partner: No    Emotionally Abused: No    Physically Abused: No    Sexually Abused: No    Review of Systems: Pertinent positive and negative review of systems were noted in the above HPI section.  All other review of systems was otherwise negative.   Physical Exam: Vital signs in last 24 hours: Temp:  [98.1 F (36.7 C)-99.2 F (37.3 C)] 98.9 F (37.2 C) (07/10 0845) Pulse Rate:  [113-134] 121 (07/10 0845) Resp:  [15-19] 18 (07/10 0845) BP: (153-173)/(95-112) 159/112 (07/10 0845) SpO2:  [98 %-100 %] 99 % (07/10 0845) Last BM Date : 02/05/23 General:   Alert,  Well-developed, very thin/frail appearing older white female pleasant and cooperative in NAD Head:  Normocephalic and atraumatic. Eyes:  Sclera clear, no icterus.   Conjunctiva pink. Ears:  Normal auditory acuity. Nose:  No deformity, discharge,  or lesions. Mouth:  No deformity or  lesions.   Neck:  Supple; no masses or thyromegaly. Lungs:  Clear throughout to auscultation.   No wheezes, crackles, or rhonchi.  Heart:  Regular rate and rhythm; no murmurs, clicks, rubs,  or gallops. Abdomen:  Soft, nondistended, no focal tenderness, BS active,nonpalp mass or hsm.   Rectal: Not done Msk:  Symmetrical without gross deformities. . Pulses:  Normal pulses noted. Extremities:  Without clubbing or edema.  Left upper extremity in a sling Neurologic:  Alert and  oriented x4;  grossly normal neurologically. Skin:  Intact without significant lesions or rashes.. Psych:  Alert and cooperative. Normal mood and affect.  Intake/Output from previous day: 07/09 0701 - 07/10 0700 In: 1347.9 [I.V.:1347.9] Out: 2 [Urine:1; Stool:1] Intake/Output this shift: No intake/output data recorded.  Lab Results: Recent Labs    02/04/23 1518 02/04/23 1758 02/06/23 0313  WBC 9.0 8.2 6.0  HGB 13.2 12.5 12.0  HCT 39.2 39.5 36.4  PLT 352 346 331   BMET Recent Labs    02/04/23 1545 02/04/23 1758 02/06/23 0313  NA 134 132* 134*  K 4.1 3.8 3.1*  CL 98 99 101  CO2 20 21* 24  GLUCOSE 114* 159* 165*  BUN 19 18 <5*  CREATININE 1.38* 1.34* 0.67  CALCIUM 8.6* 8.6* 8.7*   LFT Recent Labs    02/06/23 0313  PROT 6.0*  ALBUMIN 2.9*  AST 26  ALT 21  ALKPHOS 193*  BILITOT 0.6   PT/INR No results for input(s): "LABPROT", "INR" in the last 72 hours. Hepatitis Panel No results for input(s): "HEPBSAG", "HCVAB", "HEPAIGM", "HEPBIGM" in the last 72 hours.   IMPRESSION:  #45 67 year old white female with indeterminant colitis, maintained on Entyvio every 4 weeks with good control of symptoms  #2 recent progressive daily nausea vomiting inability to keep down pills, then onset of worsening back pain last week. Had been on Mounjaro for multiple months, finally stopped this 2 days ago, in addition she had been taking narcotic analgesic regularly over the past 5 weeks after she fractured her  left humerus.  Suspect the nausea and vomiting symptoms are drug-induced due to combination of Mounjaro and hydrocodone both of which she has stopped over the past few days. Addition she had been on several psychotropic meds which she had not been able to keep down and may have had component of withdrawal symptoms from these.  #3 dysphagia-she gives just a very specific symptom of sense of not being able to make a swallowing motion which occurs intermittently at nighttime after she has gone to bed, this causes  her to panic, gets up and tries to drink water for relief of symptoms. She does not give any history of daytime symptoms of dysphagia to liquids and solids and had normal esophagus on EGD in October 2023.  Symptoms may be functional, more panic related-certainly do a barium swallow that does not sound like an ongoing motility issue by current symptoms.  #4 history of recurrent UTIs-abnormal UA on 02/04/2023-no evidence of pyelo on CT Culture pending  #5 bilateral low back pain-this may be musculoskeletal in etiology with 3 falls at home over the past few weeks landing on her back.  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 Bipolar disorder and severe anxiety-noticed that she had an anxiety/panic attack last p.m. feels much better today  #8 diabetes mellitus 9.  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 Can discuss liver biopsy patient would prefer to have prior to discharge    Stoy Fenn PA-C 02/06/2023, 10:54 AM

## 2023-02-06 NOTE — Progress Notes (Signed)
PROGRESS NOTE    Amy Hansen  NWG:[id number] DOB: 1956/04/19 DOA: 02/04/2023 PCP: [name], NP    Brief Narrative:  Amy Hansen is a 67 y.o. female with medical history significant of chronic diastolic CHF, colitis, DM, and TIA presenting with flank pain.  Patient has a pan-positive ROS and reports anxiety most of all.  +headache, + chills, vision changes, "frequent TIAs" with memory loss, tinnitus, dysphagia, weight loss, palpitations, SOB, cough, abdominal pain (not present on exam while distracted), intractable n/v, urinary frequency, rash (not present on exam), bilateral flank pain (not present on exam while distracted), numbness, severe anxiety.  She went to be seen at urology recently and was treated with doxy x 10 days.   Apparently overnight had melena per RNs.    Assessment and Plan: AKI -Patient without baseline compromised renal function  -Current creatinine is increased at least 1.5 times compared to baseline and presumed to have occurred within the last 7 days -Likely due to prerenal secondary to severe dehydration in the setting of dysphagia and n/v  -resolved -no sign of infection so will not give any further abx   Melena -GI consulted -lovenox held  Anxiety -She had a diffusely positive ROS and obvious frank anxiety  -She is on significant psychotropic medications at baseline including alprazolam, bupropion, lamotrigine, Quetiapine, sertraline -Psychiatry consult requested:  start seroquel 100 at bedtime,  start home xanax ( pt on long slow outpt taper)   Dysphagia, N/V, Colitis -She has had significant colitis but this appears to be fairly well controlled on Entyvio - fecal calprotectin and CRP pending -She has a large amount of stool on CT and so likely needs a bowel regimen, which might help her n/v - Senekot S (home med) + Miralax BID -GI consulted   HTN -She does not appear to be taking medications for this issue at this time    DM -She has  had significant weight loss and is quite thin -She was taking Mounjaro but has stopped this (appropriately) -Nutrition consult ordered -SSI while in the hospital   Chronic diastolic CHF -Appears compenstated   HLD -Continue rosuvastatin   Hypokalemia -replete     DVT prophylaxis: Place and maintain sequential compression device Start: 02/06/23 0138    Code Status: Full Code   Disposition Plan:  Level of care: Telemetry Medical Status is: Observation The patient will require care spanning > 2 midnights and should be moved to inpatient     Consultants:  Psych GI   Subjective: C/o nausea and anxiety  Objective: Vitals:   02/05/23 1955 02/06/23 0000 02/06/23 0404 02/06/23 0845  BP: (!) 163/99 (!) 157/98 (!) 153/95 (!) 159/112  Pulse:  (!) 134 (!) 113 (!) 121  Resp: 17 16 19 18   Temp: 99.2 F (37.3 C) 98.7 F (37.1 C) 98.1 F (36.7 C) 98.9 F (37.2 C)  TempSrc:   Oral Oral  SpO2: 100% 98% 98% 99%  Weight:      Height:        Intake/Output Summary (Last 24 hours) at 02/06/2023 1135 Last data filed at 02/06/2023 0542 Gross per 24 hour  Intake 1347.91 ml  Output 2 ml  Net 1345.91 ml   Filed Weights   02/04/23 1741  Weight: 49.4 kg    Examination:   General: Appearance:    Well developed, well nourished female in no acute distress     Lungs:     Clear to auscultation bilaterally, respirations unlabored  Heart:  Tachycardic. Normal rhythm. No murmurs, rubs, or gallops.    MS:   All extremities are intact.    Neurologic:   Awake, alert, oriented x 3. No apparent focal neurological           defect.        Data Reviewed: I have personally reviewed following labs and imaging studies  CBC: Recent Labs  Lab 02/04/23 1518 02/04/23 1758 02/06/23 0313  WBC 9.0 8.2 6.0  NEUTROABS 6.1  --   --   HGB 13.2 12.5 12.0  HCT 39.2 39.5 36.4  MCV 87 88.6 84.8  PLT 352 346 331   Basic Metabolic Panel: Recent Labs  Lab 02/04/23 1545 02/04/23 1758  02/06/23 0313  NA 134 132* 134*  K 4.1 3.8 3.1*  CL 98 99 101  CO2 20 21* 24  GLUCOSE 114* 159* 165*  BUN 19 18 <5*  CREATININE 1.38* 1.34* 0.67  CALCIUM 8.6* 8.6* 8.7*   GFR: Estimated Creatinine Clearance: 50.4 mL/min (by C-G formula based on SCr of 0.67 mg/dL). Liver Function Tests: Recent Labs  Lab 02/04/23 1545 02/04/23 1758 02/06/23 0313  AST 29 31 26   ALT 22 24 21   ALKPHOS 263* 205* 193*  BILITOT 0.2 0.4 0.6  PROT 6.5 6.5 6.0*  ALBUMIN 3.4* 3.0* 2.9*   Recent Labs  Lab 02/04/23 1545 02/04/23 1758  LIPASE 34 32  AMYLASE 47  --    No results for input(s): "AMMONIA" in the last 168 hours. Coagulation Profile: No results for input(s): "INR", "PROTIME" in the last 168 hours. Cardiac Enzymes: Recent Labs  Lab 02/05/23 0450  CKTOTAL 72   BNP (last 3 results) No results for input(s): "PROBNP" in the last 8760 hours. HbA1C: Recent Labs    02/04/23 1510  HGBA1C 6.5*   CBG: Recent Labs  Lab 02/04/23 2235 02/05/23 0225  GLUCAP 158* 240*   Lipid Profile: Recent Labs    02/04/23 1545  CHOL 112  HDL 44  LDLCALC 37  TRIG 192*   Thyroid Function Tests: No results for input(s): "TSH", "T4TOTAL", "FREET4", "T3FREE", "THYROIDAB" in the last 72 hours. Anemia Panel: Recent Labs    02/04/23 1545  VITAMINB12 345   Sepsis Labs: Recent Labs  Lab 02/05/23 0450 02/05/23 0622  LATICACIDVEN 2.1* 1.8    Recent Results (from the past 240 hour(s))  Microscopic Examination     Status: Abnormal   Collection Time: 02/04/23  3:17 PM   Urine  Result Value Ref Range Status   WBC, UA 6-10 (A) 0 - 5 /hpf Final   RBC, Urine 0-2 0 - 2 /hpf Final   Epithelial Cells (non renal) 0-10 0 - 10 /hpf Final   Casts Present (A) None seen /lpf Final   Cast Type Hyaline casts N/A Final   Bacteria, UA None seen None seen/Few Final  Blood culture (routine x 2)     Status: None (Preliminary result)   Collection Time: 02/05/23  4:50 AM   Specimen: BLOOD LEFT FOREARM   Result Value Ref Range Status   Specimen Description BLOOD LEFT FOREARM  Final   Special Requests   Final    BOTTLES DRAWN AEROBIC AND ANAEROBIC Blood Culture adequate volume   Culture   Final    NO GROWTH < 24 HOURS Performed at [facility], [address] [place], [place] [address]    Report Status PENDING  Incomplete  Blood culture (routine x 2)     Status: None (Preliminary result)  Collection Time: 02/05/23  5:10 AM   Specimen: BLOOD RIGHT FOREARM  Result Value Ref Range Status   Specimen Description BLOOD RIGHT FOREARM  Final   Special Requests   Final    BOTTLES DRAWN AEROBIC AND ANAEROBIC Blood Culture adequate volume   Culture   Final    NO GROWTH < 24 HOURS Performed at [facility], [address] [place], [place] [address]    Report Status PENDING  Incomplete         Radiology Studies: DG Chest 2 View  Result Date: 02/05/2023 CLINICAL DATA:  67 year old female with vomiting, weakness. EXAM: CHEST - 2 VIEW COMPARISON:  Chest radiograph 09/26/2022 and earlier. FINDINGS: Seated upright AP and lateral views of the chest at 0638 hours. Lung volumes and mediastinal contours remain normal. Visualized tracheal air column is within normal limits. Lung markings do not appear significantly changed from 2013 radiographs, with no pneumothorax, pulmonary edema, pleural effusion, or confluent lung opacity. Subacute or chronic appearing deformity of the left humeral neck. Periosteal new bone formation there. Fracture there was demonstrated on left humerus series 12/21/2022. No new osseous abnormality identified. Negative visible bowel gas. IMPRESSION: 1. No acute cardiopulmonary abnormality. 2. Proximal left humerus fracture which occurred in May. Electronically Signed   By: Odessa Fleming M.D.   On: 02/05/2023 06:53   CT HEAD WO CONTRAST ( )  Result Date: 02/05/2023 CLINICAL DATA:  Multiple falls, facial trauma EXAM: CT HEAD WITHOUT CONTRAST TECHNIQUE: Contiguous axial  images were obtained from the base of the skull through the vertex without intravenous contrast. RADIATION DOSE REDUCTION: This exam was performed according to the departmental dose-optimization program which includes automated exposure control, adjustment of the mA and/or kV according to patient size and/or use of iterative reconstruction technique. COMPARISON:  12/14/2022 CTA head and neck FINDINGS: Brain: No evidence of acute infarction, hemorrhage, mass, mass effect, or midline shift. No hydrocephalus or extra-axial fluid collection. Periventricular white matter changes, likely the sequela of chronic small vessel ischemic disease. Vascular: No hyperdense vessel. Atherosclerotic calcifications in the intracranial carotid and vertebral arteries. Skull: Negative for fracture or focal lesion. Sinuses/Orbits: No acute finding. Other: The mastoid air cells are well aerated. IMPRESSION: No acute intracranial process. Electronically Signed   By: Wiliam Ke M.D.   On: 02/05/2023 03:17   CT Renal Stone Study  Result Date: 02/05/2023 CLINICAL DATA:  Abdominal and flank pain vomiting and chills. Evaluate for pancreatitis. EXAM: CT ABDOMEN AND PELVIS WITHOUT CONTRAST TECHNIQUE: Multidetector CT imaging of the abdomen and pelvis was performed following the standard protocol without IV contrast. RADIATION DOSE REDUCTION: This exam was performed according to the departmental dose-optimization program which includes automated exposure control, adjustment of the mA and/or kV according to patient size and/or use of iterative reconstruction technique. COMPARISON:  CT abdomen and pelvis 11/13/2022 FINDINGS: Lower chest: No acute abnormality. Hepatobiliary: No focal liver abnormality is seen. Status post cholecystectomy. No biliary dilatation. Pancreas: Unremarkable. No pancreatic ductal dilatation or surrounding inflammatory changes. Spleen: Normal in size without focal abnormality. Adrenals/Urinary Tract: There is a 6 mm  calculus in the left kidney. There is no hydronephrosis or perinephric fluid. Otherwise, the kidneys, adrenal glands, and bladder are within normal limits. 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 There is no evidence for bowel obstruction, pneumatosis or free air. The appendix is not visualized. There is a large amount of stool throughout the colon. Scattered colonic diverticula are present. Vascular/Lymphatic: Aortic atherosclerosis. No enlarged abdominal or pelvic lymph nodes.  Reproductive: Status post hysterectomy. No adnexal masses. Other: No abdominal wall hernia or abnormality. No abdominopelvic ascites. 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 L2-L3 degenerative endplate changes are similar to the prior study. IMPRESSION: 1. No acute localizing process in the abdomen or pelvis. 2. Nonobstructing left renal calculus. 3. Gastric bypass changes. Aortic Atherosclerosis (ICD10-I70.0). Electronically Signed   By: Darliss Cheney M.D.   On: 02/05/2023 02:27   DG Abdomen 1 View  Result Date: 02/04/2023 CLINICAL DATA:  Evaluate for obstruction.  Vomiting EXAM: ABDOMEN - 1 VIEW COMPARISON:  None Available. FINDINGS: Nonobstructive bowel gas pattern. No organomegaly or free air. No suspicious calcification or acute bony abnormality. Levoscoliosis in the upper lumbar spine. IMPRESSION: No evidence of bowel obstruction or free air. Electronically Signed   By: Charlett Nose M.D.   On: 02/04/2023 19:00        Scheduled Meds:  ALPRAZolam  0.5 mg Oral Daily   ALPRAZolam  0.75 mg Oral QHS   aspirin EC  81 mg Oral Daily   feeding supplement  1 Container Oral TID BM   folic acid  1 mg Oral Daily   insulin aspart  0-15 Units Subcutaneous TID WC   insulin aspart  0-5 Units Subcutaneous QHS   multivitamin with minerals  1 tablet Oral Daily   polyethylene glycol  17 g Oral BID   pramipexole  1 mg Oral QHS   QUEtiapine  100 mg Oral QHS   rosuvastatin  5 mg Oral Daily    senna-docusate  1 tablet Oral Daily   sodium chloride flush  3 mL Intravenous Q12H   thiamine  100 mg Oral Daily   Or   thiamine  100 mg Intravenous Daily   Continuous Infusions:  lactated ringers 75 mL/hr at 02/06/23 0542     LOS: 0 days    Time spent: 45 minutes spent on chart review, discussion with nursing staff, consultants, updating family and interview/physical exam; more than 50% of that time was spent in counseling and/or coordination of care.    Joseph Art, DO [facility] Available via Epic secure chat 7am-7pm After these hours, please refer to coverage provider listed on [email or web address] 02/06/2023, 11:35 AM

## 2023-02-06 NOTE — Consult Note (Addendum)
[facility] Consult Evaluation  Service Date: February 06, 2023 LOS:  LOS: 0 days    Primary Psychiatric Diagnoses  Bipolar Disorder 2.  Anxiety 3.  BZD w/d  Assessment  ADIE VILAR is a 67 y.o. female admitted medically for 02/04/2023  5:26 PM for bilateral flank pain after about a week of N+V. She carries the psychiatric diagnoses of bipolar disorder and GAD and has a past medical history of  arthritis, cataracts, CHF, colitis/chrohn's, diabetes, headaches, recurrent UTIs and restless leg syndrome. Psychiatry was consulted for palpable anxiety by Dr. Ophelia Charter.   Her current presentation of worsening psychiatric symptoms particularly anxiety over the last month is most consistent with medication noncompliance (patient unable to hold pills down).  Thankfully nausea was somewhat improved by the time that I saw her.  She meets criteria for bipolar disorder and generalized anxiety disorder based on clinical interview.  Current outpatient psychotropic medications include Xanax, Wellbutrin, Seroquel, pramipexole, Requip, Zoloft, Lamictal and historically she has had a fair response to these medications.On initial examination, patient had responded fairly well to IV Ativan and was able to engage well in extended interview.  Using joint decision-making, we set the goals of treating patient's anxiety, helping patient's sleep, and avoiding triggering a manic episode or worsening nausea (do not want to start several serotonergic agents at once).  I explained in detail to patient that as she has been off of her extensive regimen for about a month we will be restarting medication slowly and she may or may not be discharged on her prior outpatient regimen.  please see plan below for detailed recommendations.   7/10: looks much better, swapping xanax for ativan, no other changes. Maybe inc seroquel next. Continue to follow.   Diagnoses:  Active Hospital problems: Principal Problem:   AKI (acute kidney  injury) (HCC) Active Problems:   Restless legs syndrome (RLS)   Diabetes mellitus type 2 in nonobese (HCC)   Hypertension associated with type 2 diabetes mellitus (HCC)   Chronic heart failure with preserved ejection fraction (HCC)   Pancolitis (HCC)   Anxiety disorder   Dysphagia     Plan   ## Psychiatric Medication Recommendations:  -- start seroquel 100 at bedtime  -- STOP home xanax ( pt on long slow outpt taper) -- START lorazepam 1 qAM 1.5 q PM (equivalent to 0.5/0.75 of xanax)  Hold remainder of psych meds. Needs to totally retitrate lamictal as was off for several weeks as outpt. Next step is probably increase seroquel.    ## Medical Decision Making Capacity:  Not formally assessed  ## Further Work-up:  -- none currently     -- most recent EKG on 7/8 had QtC of 440 -- Pertinent labwork reviewed earlier this admission includes: AKI  ## Disposition:  -- There are no current psychiatric contraindications to discharge at this time  ## Behavioral / Environmental:  --   No specific recommendations at this time.    Place patient is very early ## Safety and Observation Level:  - Based on my clinical evaluation, I estimate the patient to be at low risk of self harm in the current setting - At this time, we recommend a routine level of observation. This decision is based on my review of the chart including patient's history and current presentation, interview of the patient, mental status examination, and consideration of suicide risk including evaluating suicidal ideation, plan, intent, suicidal or self-harm behaviors, risk factors, and protective factors. This judgment is  based on our ability to directly address suicide risk, implement suicide prevention strategies and develop a safety plan while the patient is in the clinical setting. Please contact our team if there is a concern that risk level has changed.    Reymundo Winship A Janna Oak  Psychiatric and Social History    Relevant Aspects of Hospital Course:  Admitted on 02/04/2023 for L flank pain, nausea and vomiting. They were extremely anxious in the ED and psychiatry was consulted.   Patient Report:  Pt seen in the afternoon. She was again calmer than when I saw yesterday, less circumstantial. States she slept well last night for the first time in a few weeks, and has been napping through the day to catch up. Discussed not sleeping past around 2 PM to preserve sleep/wake cycle. We went over changes to regimen (mostly sparked by long noncompliance/nausea) and goals of getting her to sleep, treating anxiety, not making her more nauseous - so far success on all fronts, No SI, but ruminations about mortality due to fall and decreased physical health. Discussed focusing on living a life without regrets and spending time with family. Asks about swapping xanax to ativan as she felt it made her calmer, discussed we can do dose-equivalent swap but will not increase BZD load. No HI, AH/VH.   Psych ROS:  As above   Collateral information:  None today  Psychiatric History:  Information collected from pt, EMR.   Prev Dx/Sx: bipolar disorder, anxiety Current Psych Provider: Lowell Guitar, PA-C. Priorly Dr. Tomasa Rand Home Meds (current):  Xanax 0.5-1 qAM and 0.75 qPM (tapering) Wellbutrin 450 qAM Quetiapine 300  mg at bedtime Zoloft 250 every day Lamotrigine 200 BID Previous Med Trials: Trazodone, Risperdal, Zoloft, Lunesta, prazosin, Sonata, Prozac, Depakote, Lamictal, lithium, Wellbutrin, Xanax, Ambien, carbamazepine, Seroquel, Buspar caused falls and hallucinations, Gabapentin-she doesn't want to take   Therapy: not currently  Prior Psych Hospitalization: no  Prior Self Harm: remote SI (decades) Prior Violence: used to throw things at husband when manic (decades)  Family Psych History: father with unspecified dx Family Hx suicide: cousins no 1st degree relatives  Social History:  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 Finds work deeply rewarding.  Living Situation: Husband and daughter Spiritual Hx: yes, strong spiritual beliefs against suicide Access to weapons: under lock and key. She does not have key  Substance History Tobacco use: no Alcohol use: no Drug use: no   Exam Findings   Psychiatric Specialty Exam:  Presentation  General Appearance: Appropriate for Environment; Well Groomed  Eye Contact:Good  Speech:Clear and Coherent  Speech Volume:Normal  Handedness:No data recorded  Mood and Affect  Mood:-- (Much calmer)  Affect:Congruent   Thought Process  Thought Processes:Linear; Goal Directed; Coherent  Descriptions of Associations:Intact  Orientation:Full (Time, Place and Person)  Thought Content:-- (devoid of delusions, paranoia)  Hallucinations:Hallucinations: None  Ideas of Reference:None  Suicidal Thoughts:Suicidal Thoughts: No  Homicidal Thoughts:Homicidal Thoughts: No   Sensorium  Memory:Immediate Fair; Recent Fair; Remote Fair  Judgment:Fair  Insight:Fair   Executive Functions  Concentration:Fair  Attention Span:Fair  Recall:Fair  Fund of Knowledge:Good  Language:Good   Psychomotor Activity  Psychomotor Activity:Psychomotor Activity: Normal   Assets  Assets:Communication Skills; Desire for Improvement   Sleep  Sleep:Sleep: Good    Physical Exam: Vital signs:  Temp:  [98.1 F (36.7 C)-99.2 F (37.3 C)] 98.9 F (37.2 C) (07/10 0845) Pulse Rate:  [113-134] 121 (07/10 0845) Resp:  [16-19] 18 (07/10 0845)  BP: (153-163)/(95-112) 159/112 (07/10 0845) SpO2:  [98 %-100 %] 99 % (07/10 0845) Physical Exam HENT:     Head: Normocephalic.  Pulmonary:     Effort: Pulmonary effort is normal.  Neurological:     Mental Status: She is alert.     Blood pressure (!) 159/112, pulse (!) 121, temperature 98.9 F (37.2 C), temperature source Oral, resp. rate 18, height  5' (1.524 m), weight 49.4 kg, SpO2 99 %. Body mass index is 21.29 kg/m.   Other History   These have been pulled in through the EMR, reviewed, and updated if appropriate.   Family History:  The patient's family history includes Colon cancer in her mother; Heart failure in her sister; Stroke in her father.  Medical History: Past Medical History:  Diagnosis Date   Anemia    Anxiety    Arthritis    Blood transfusion without reported diagnosis    Cataract    CHF (congestive heart failure) (HCC)    Chronic kidney disease    UTI, hematuria in urine   Colitis    Crohn's disease (HCC)    Depression    Diabetes (HCC)    Diverticulosis    Frequent headaches    Interstitial cystitis    Recurrent UTI    Restless leg syndrome    TIA (transient ischemic attack) 02/20/2021   Urinary frequency     Surgical History: Past Surgical History:  Procedure Laterality Date   bariatric bypass  2012   BIOPSY  05/03/2020   Procedure: BIOPSY;  Surgeon: Rachael Fee, MD;  Location: [facility] ENDOSCOPY;  Service: Endoscopy;;   CARPAL TUNNEL RELEASE Right 2003   CARPAL TUNNEL RELEASE Right    2008   CHOLECYSTECTOMY  1975   COLONOSCOPY     COLONOSCOPY WITH PROPOFOL N/A 05/03/2020   Procedure: COLONOSCOPY WITH PROPOFOL;  Surgeon: Rachael Fee, MD;  Location: [facility] ENDOSCOPY;  Service: Endoscopy;  Laterality: N/A;   CYSTOSCOPY W/ RETROGRADES Bilateral 06/06/2015   Procedure: CYSTOSCOPY WITH RETROGRADE PYELOGRAM;  Surgeon: Jerilee Field, MD;  Location: [facility] ORS;  Service: Urology;  Laterality: Bilateral;   EYE SURGERY Left 07/06/2022   FL INJ LEFT KNEE CT ARTHROGRAM ([facility] HX) Left    1995   GASTRIC BYPASS  2010   HAND SURGERY Left 01/19/2021   Thumb   HEMORRHOID SURGERY  2013   KNEE ARTHROSCOPY Left 1996   TONSILLECTOMY     TOTAL ABDOMINAL HYSTERECTOMY W/ BILATERAL SALPINGOOPHORECTOMY      Medications:   Current Facility-Administered Medications:    acetaminophen (TYLENOL) tablet 650 mg,  650 mg, Oral, Q6H PRN, 650 mg at 02/05/23 2040 **OR** acetaminophen (TYLENOL) suppository 650 mg, 650 mg, Rectal, Q6H PRN, Jonah Blue, MD   aspirin EC tablet 81 mg, 81 mg, Oral, Daily, Jonah Blue, MD, 81 mg at 02/06/23 1028   bisacodyl (DULCOLAX) EC tablet 5 mg, 5 mg, Oral, Daily PRN, Jonah Blue, MD   feeding supplement (BOOST / RESOURCE BREEZE) liquid 1 Container, 1 Container, Oral, TID BM, Vann, Jessica U, DO   folic acid (FOLVITE) tablet 1 mg, 1 mg, Oral, Daily, Julian Reil, Jared M, DO, 1 mg at 02/06/23 1028   hydrALAZINE (APRESOLINE) injection 5 mg, 5 mg, Intravenous, Q4H PRN, Jonah Blue, MD, 5 mg at 02/05/23 1132   insulin aspart (novoLOG) injection 0-15 Units, 0-15 Units, Subcutaneous, TID WC, Vann, Jessica U, DO   insulin aspart (novoLOG) injection 0-5 Units, 0-5 Units, Subcutaneous, QHS, Vann, Jessica U, DO   lactated ringers infusion, ,  Intravenous, Continuous, Jonah Blue, MD, Last Rate: 75 mL/hr at 02/06/23 0542, Infusion Verify at 02/06/23 0542   LORazepam (ATIVAN) tablet 1-4 mg, 1-4 mg, Oral, Q1H PRN, 1 mg at 02/06/23 0428 **OR** LORazepam (ATIVAN) injection 1-4 mg, 1-4 mg, Intravenous, Q1H PRN, Hillary Bow, DO, 1 mg at 02/06/23 0111   LORazepam (ATIVAN) tablet 1 mg, 1 mg, Oral, q morning, Chevette Fee A   LORazepam (ATIVAN) tablet 1.5 mg, 1.5 mg, Oral, QHS, Iveliz Garay A   metoprolol tartrate (LOPRESSOR) tablet 25 mg, 25 mg, Oral, BID, Vann, Jessica U, DO   multivitamin with minerals tablet 1 tablet, 1 tablet, Oral, Daily, Julian Reil, Jared M, DO, 1 tablet at 02/06/23 1028   ondansetron (ZOFRAN) injection 4 mg, 4 mg, Intravenous, Q6H PRN, Jonah Blue, MD, 4 mg at 02/06/23 1024   polyethylene glycol (MIRALAX / GLYCOLAX) packet 17 g, 17 g, Oral, BID, Jonah Blue, MD, 17 g at 02/06/23 1028   pramipexole (MIRAPEX) tablet 1 mg, 1 mg, Oral, QHS, Jonah Blue, MD, 1 mg at 02/05/23 2039   QUEtiapine (SEROQUEL) tablet 100 mg, 100 mg, Oral, QHS,  Huyen Perazzo A, 100 mg at 02/05/23 2040   rosuvastatin (CRESTOR) tablet 5 mg, 5 mg, Oral, Daily, Jonah Blue, MD, 5 mg at 02/06/23 1028   senna-docusate (Senokot-S) tablet 1 tablet, 1 tablet, Oral, Daily, Jonah Blue, MD, 1 tablet at 02/06/23 1028   sodium chloride flush (NS) 0.9 % injection 3 mL, 3 mL, Intravenous, Q12H, Jonah Blue, MD, 3 mL at 02/06/23 1029   thiamine (VITAMIN B1) tablet 100 mg, 100 mg, Oral, Daily, 100 mg at 02/06/23 1028 **OR** thiamine (VITAMIN B1) injection 100 mg, 100 mg, Intravenous, Daily, Lyda Perone M, DO  Allergies: Allergies  Allergen Reactions   Avelox [Moxifloxacin Hcl In Nacl] Anaphylaxis   Bactrim [Sulfamethoxazole-Trimethoprim] Anaphylaxis   Ciprofloxacin Other (See Comments)    Pt states she was told never to take this as it is in the same family as Avelox.    Buspar [Buspirone] Other (See Comments)    hallucinations   Neomycin-Polymyxin-Dexameth Other (See Comments)    Burning in the eyes   Aquaphor [Lanolin-Petrolatum] Itching and Rash   Depakote [Divalproex Sodium] Other (See Comments)    Unknown Reaction   Imitrex [Sumatriptan] Other (See Comments)    Neck and shoulder pain   Stadol [Butorphanol] Rash

## 2023-02-07 DIAGNOSIS — Z9884 Bariatric surgery status: Secondary | ICD-10-CM | POA: Diagnosis not present

## 2023-02-07 DIAGNOSIS — E876 Hypokalemia: Secondary | ICD-10-CM | POA: Diagnosis present

## 2023-02-07 DIAGNOSIS — F411 Generalized anxiety disorder: Secondary | ICD-10-CM | POA: Diagnosis present

## 2023-02-07 DIAGNOSIS — G2581 Restless legs syndrome: Secondary | ICD-10-CM | POA: Diagnosis present

## 2023-02-07 DIAGNOSIS — K529 Noninfective gastroenteritis and colitis, unspecified: Secondary | ICD-10-CM

## 2023-02-07 DIAGNOSIS — F29 Unspecified psychosis not due to a substance or known physiological condition: Secondary | ICD-10-CM | POA: Diagnosis present

## 2023-02-07 DIAGNOSIS — E119 Type 2 diabetes mellitus without complications: Secondary | ICD-10-CM | POA: Diagnosis present

## 2023-02-07 DIAGNOSIS — R634 Abnormal weight loss: Secondary | ICD-10-CM

## 2023-02-07 DIAGNOSIS — F319 Bipolar disorder, unspecified: Secondary | ICD-10-CM | POA: Diagnosis present

## 2023-02-07 DIAGNOSIS — R112 Nausea with vomiting, unspecified: Secondary | ICD-10-CM

## 2023-02-07 DIAGNOSIS — E785 Hyperlipidemia, unspecified: Secondary | ICD-10-CM | POA: Diagnosis present

## 2023-02-07 DIAGNOSIS — W19XXXA Unspecified fall, initial encounter: Secondary | ICD-10-CM | POA: Diagnosis present

## 2023-02-07 DIAGNOSIS — K5090 Crohn's disease, unspecified, without complications: Secondary | ICD-10-CM | POA: Diagnosis present

## 2023-02-07 DIAGNOSIS — R1310 Dysphagia, unspecified: Secondary | ICD-10-CM

## 2023-02-07 DIAGNOSIS — F419 Anxiety disorder, unspecified: Secondary | ICD-10-CM | POA: Diagnosis present

## 2023-02-07 DIAGNOSIS — E871 Hypo-osmolality and hyponatremia: Secondary | ICD-10-CM | POA: Diagnosis present

## 2023-02-07 DIAGNOSIS — K311 Adult hypertrophic pyloric stenosis: Secondary | ICD-10-CM | POA: Diagnosis present

## 2023-02-07 DIAGNOSIS — E860 Dehydration: Secondary | ICD-10-CM | POA: Diagnosis present

## 2023-02-07 DIAGNOSIS — I509 Heart failure, unspecified: Secondary | ICD-10-CM

## 2023-02-07 DIAGNOSIS — N179 Acute kidney failure, unspecified: Secondary | ICD-10-CM | POA: Diagnosis present

## 2023-02-07 DIAGNOSIS — Z881 Allergy status to other antibiotic agents status: Secondary | ICD-10-CM | POA: Diagnosis not present

## 2023-02-07 DIAGNOSIS — Z8673 Personal history of transient ischemic attack (TIA), and cerebral infarction without residual deficits: Secondary | ICD-10-CM | POA: Diagnosis not present

## 2023-02-07 DIAGNOSIS — R748 Abnormal levels of other serum enzymes: Secondary | ICD-10-CM

## 2023-02-07 DIAGNOSIS — K921 Melena: Secondary | ICD-10-CM | POA: Diagnosis not present

## 2023-02-07 DIAGNOSIS — K279 Peptic ulcer, site unspecified, unspecified as acute or chronic, without hemorrhage or perforation: Secondary | ICD-10-CM | POA: Diagnosis present

## 2023-02-07 DIAGNOSIS — Z87891 Personal history of nicotine dependence: Secondary | ICD-10-CM | POA: Diagnosis not present

## 2023-02-07 DIAGNOSIS — F410 Panic disorder [episodic paroxysmal anxiety] without agoraphobia: Secondary | ICD-10-CM | POA: Diagnosis present

## 2023-02-07 DIAGNOSIS — I5032 Chronic diastolic (congestive) heart failure: Secondary | ICD-10-CM | POA: Diagnosis present

## 2023-02-07 DIAGNOSIS — I110 Hypertensive heart disease with heart failure: Secondary | ICD-10-CM | POA: Diagnosis present

## 2023-02-07 DIAGNOSIS — R200 Anesthesia of skin: Secondary | ICD-10-CM | POA: Diagnosis present

## 2023-02-07 LAB — BASIC METABOLIC PANEL
Anion gap: 7 (ref 5–15)
BUN: <5 mg/dL — ABNORMAL LOW (ref 8–23)
CO2: 26 mmol/L (ref 22–32)
Calcium: 8.3 mg/dL — ABNORMAL LOW (ref 8.9–10.3)
Chloride: 100 mmol/L (ref 98–111)
Creatinine, Ser: 0.56 mg/dL (ref 0.44–1.00)
GFR, Estimated: >60 mL/min (ref >60)
Glucose, Bld: 100 mg/dL — ABNORMAL HIGH (ref 70–99)
Potassium: 2.9 mmol/L — ABNORMAL LOW (ref 3.5–5.1)
Sodium: 133 mmol/L — ABNORMAL LOW (ref 135–145)

## 2023-02-07 LAB — CBC
HCT: 35.4 % — ABNORMAL LOW (ref 36.0–46.0)
Hemoglobin: 11.7 g/dL — ABNORMAL LOW (ref 12.0–15.0)
MCH: 28.5 pg (ref 26.0–34.0)
MCHC: 33.1 g/dL (ref 30.0–36.0)
MCV: 86.1 fL (ref 80.0–100.0)
Platelets: 287 10*3/uL (ref 150–400)
RBC: 4.11 MIL/uL (ref 3.87–5.11)
RDW: 13.2 % (ref 11.5–15.5)
WBC: 4.6 10*3/uL (ref 4.0–10.5)
nRBC: 0 % (ref 0.0–0.2)

## 2023-02-07 LAB — GLUCOSE, CAPILLARY
Glucose-Capillary: 102 mg/dL — ABNORMAL HIGH (ref 70–99)
Glucose-Capillary: 184 mg/dL — ABNORMAL HIGH (ref 70–99)
Glucose-Capillary: 82 mg/dL (ref 70–99)

## 2023-02-07 LAB — GAMMA GT: GGT: 53 U/L — ABNORMAL HIGH (ref 7–50)

## 2023-02-07 LAB — MAGNESIUM: Magnesium: 1.8 mg/dL (ref 1.7–2.4)

## 2023-02-07 MED ORDER — METHOCARBAMOL 500 MG PO TABS
500.0000 mg | ORAL_TABLET | Freq: Once | ORAL | Status: AC
  Administered 2023-02-07: 500 mg via ORAL
  Filled 2023-02-07: qty 1

## 2023-02-07 MED ORDER — POTASSIUM CHLORIDE CRYS ER 20 MEQ PO TBCR
40.0000 meq | EXTENDED_RELEASE_TABLET | ORAL | Status: AC
  Administered 2023-02-07 (×2): 40 meq via ORAL
  Filled 2023-02-07 (×3): qty 2

## 2023-02-07 NOTE — Progress Notes (Signed)
Physical Therapy Treatment Patient Details Name: Amy Hansen MRN: [id number] DOB: January 22, 1956 Today's Date: 02/07/2023   History of Present Illness 67 yo female presents to [facility] on 7/8 with n/v x3 weeks, flank pain. Workup for AKI. PMH includes anxiety, HTN, DM, colitis, recurrent falls, HF, TIA.    PT Comments  Pt greeted resting in bed and agreeable to session with continued progress towards acute goals. Pt continues to be limited by anxiety with mobility, poor balance/postural reactions and decreased activity tolerance. Pt needing up to mod A to steady throughout gait and transfers as well as cues to slow as pt with LOB needing mod A to prevent fall as pt standing and beginning gait quickly. Continued education on safety with mobility and appropriate activity progression with pt verbalizing understanding. Pt continues to benefit from skilled PT services to progress toward functional mobility goals.      Assistance Recommended at Discharge Frequent or constant Supervision/Assistance  If plan is discharge home, recommend the following:  Can travel by private vehicle    A little help with walking and/or transfers;A little help with bathing/dressing/bathroom      Equipment Recommendations  None recommended by PT    Recommendations for Other Services       Precautions / Restrictions Precautions Precautions: Fall Precaution Comments: L humeral fx 12/21/2022 Required Braces or Orthoses: Sling Restrictions Weight Bearing Restrictions: Yes LUE Weight Bearing: Non weight bearing     Mobility  Bed Mobility Overal bed mobility: Needs Assistance Bed Mobility: Supine to Sit, Sit to Supine     Supine to sit: Supervision, HOB elevated Sit to supine: Supervision, HOB elevated   General bed mobility comments: increased time and use of bed rails and cues not to use LUE    Transfers Overall transfer level: Needs assistance Equipment used: None, 1 person hand held assist Transfers:  Sit to/from Stand Sit to Stand: Min guard           General transfer comment: min guard and no assistive device    Ambulation/Gait Ambulation/Gait assistance: Mod assist Gait Distance (Feet): 300 Feet Assistive device: None, IV Pole (pt reaching for rail) Gait Pattern/deviations: Step-through pattern, Decreased stride length, Drifts right/left, Narrow base of support Gait velocity: decr     General Gait Details: assist to periodically steady pt, especially with head turns or change in gaze. pt reaching for rail use in hall   Stairs             Wheelchair Mobility     Tilt Bed    Modified Rankin (Stroke Patients Only)       Balance Overall balance assessment: Needs assistance, History of Falls Sitting-balance support: No upper extremity supported, Feet supported Sitting balance-Leahy Scale: Fair     Standing balance support: Single extremity supported, During functional activity, Reliant on assistive device for balance Standing balance-Leahy Scale: Poor                              Cognition Arousal/Alertness: Awake/alert Behavior During Therapy: Anxious Overall Cognitive Status: Impaired/Different from baseline Area of Impairment: Memory, Attention, Problem solving                   Current Attention Level: Sustained Memory: Decreased short-term memory       Problem Solving: Requires verbal cues, Requires tactile cues General Comments: pleasant and talkative during some anxiety with mobility        Exercises  General Comments General comments (skin integrity, edema, etc.): asking for anxiety medication      Pertinent Vitals/Pain Pain Assessment Pain Assessment: Faces Faces Pain Scale: Hurts little more Pain Location: LUE Pain Descriptors / Indicators: Grimacing, Guarding Pain Intervention(s): Monitored during session, Limited activity within patient's tolerance    Home Living                           Prior Function            PT Goals (current goals can now be found in the care plan section) Acute Rehab PT Goals PT Goal Formulation: With patient Time For Goal Achievement: 02/19/23 Progress towards PT goals: Progressing toward goals    Frequency    Min 3X/week      PT Plan Current plan remains appropriate    Co-evaluation              AM-PAC PT "6 Clicks" Mobility   Outcome Measure  Help needed turning from your back to your side while in a flat bed without using bedrails?: A Little Help needed moving from lying on your back to sitting on the side of a flat bed without using bedrails?: A Little Help needed moving to and from a bed to a chair (including a wheelchair)?: A Little Help needed standing up from a chair using your arms (e.g., wheelchair or bedside chair)?: A Little Help needed to walk in hospital room?: A Little Help needed climbing 3-5 steps with a railing? : A Lot 6 Click Score: 17    End of Session Equipment Utilized During Treatment: Gait belt Activity Tolerance: Patient limited by fatigue Patient left: with call bell/phone within reach;in bed;with bed alarm set Nurse Communication: Mobility status;Patient requests pain meds PT Visit Diagnosis: Other abnormalities of gait and mobility (R26.89);Muscle weakness (generalized) (M62.81)     Time: 4098-1191 PT Time Calculation (min) (ACUTE ONLY): 27 min  Charges:    $Gait Training: 8-22 mins $Therapeutic Activity: 8-22 mins PT General Charges $$ ACUTE PT VISIT: 1 Visit                     Demya Scruggs R. PTA Acute Rehabilitation Services Office: [phone]   Catalina Antigua 02/07/2023, 3:54 PM

## 2023-02-07 NOTE — Progress Notes (Signed)
Speech Language Pathology Treatment: Dysphagia  Patient Details Name: Amy Hansen MRN: [id number] DOB: 10-19-1955 Today's Date: 02/07/2023 Time: 4098-1191 SLP Time Calculation (min) (ACUTE ONLY): 19 min  Assessment / Plan / Recommendation Clinical Impression  Pt seen for f/u dysphagia tx with pt self-feeding soft consistency and mod I with esophageal precautions/swallowing precautions to A with transit into pharynx with pill/puree, soft solids and thin via straw.  Pt exhibiting globus sensation, required liquid wash, repetitive swallows and frequent belching during po consumption all suggestive of potential esophageal dysphagia.  Pt denoted hiatal hernia, but denies heartburn/GERD during meals.  Pt stated she will have esophagram as an OP, so SLP educated pt with esophageal swallowing precautions including repetitive swallows/effortful swallow, liquid wash, upright positioning, etc) and left hand-out in room.  ST will s/o at this time as pt has met goals with diet progression/tolerance and education re: swallowing precautions/strategies to A with dysphagia symptoms.  Pt in agreement and appreciative.    HPI HPI: 67yo female admitted 02/04/23 with B flank pain, N/V x3 wks. PMH: chronic diastolic CHF, colitis, DM, TIA, anxiety/depression, headaches. CXR - no acute abnormality. ST consulted for BSE with clear liquid recommendation; pt advanced to soft/thin; ST f/u for esophageal prec education/diet check.      SLP Plan  Discharge SLP treatment due to (goals met)      Recommendations for follow up therapy are one component of a multi-disciplinary discharge planning process, led by the attending physician.  Recommendations may be updated based on patient status, additional functional criteria and insurance authorization.    Recommendations  Diet recommendations: Regular;Thin liquid;Other(comment) (soft, pt preferred) Liquids provided via: Cup;Straw Medication Administration: Whole meds with puree  (for ease of transition) Supervision: Patient able to self feed Compensations: Slow rate;Small sips/bites;Multiple dry swallows after each bite/sip;Follow solids with liquid;Effortful swallow Postural Changes and/or Swallow Maneuvers: Seated upright 90 degrees;Upright 30-60 min after meal                  Oral care BID;Patient independent with oral care     Dysphagia, unspecified (R13.10)     Discharge SLP treatment due to (comment)     Pat Amy Hansen,M.S., CCC-SLP  02/07/2023, 12:03 PM

## 2023-02-07 NOTE — Consult Note (Signed)
Amy Hansen Psychiatry Consult Evaluation  Service Date: February 07, 2023 LOS:  LOS: 0 days    Primary Psychiatric Diagnoses  Bipolar Disorder 2.  Anxiety 3.  BZD w/d  Assessment  Amy Hansen is a 67 y.o. female admitted medically for 02/04/2023  5:26 PM for bilateral flank pain after about a week of N+V. She carries the psychiatric diagnoses of bipolar disorder and GAD and has a past medical history of  arthritis, cataracts, CHF, colitis/chrohn's, diabetes, headaches, recurrent UTIs and restless leg syndrome. Psychiatry was consulted for palpable anxiety by Dr. Ophelia Charter.  Her current presentation of worsening psychiatric symptoms particularly anxiety over the last month is most consistent with medication noncompliance (patient unable to hold pills down).  Thankfully nausea was somewhat improved by the time that I saw her.  She meets criteria for bipolar disorder and generalized anxiety disorder based on clinical interview.  Current outpatient psychotropic medications include Xanax, Wellbutrin, Seroquel, pramipexole, Requip, Zoloft, Lamictal and historically she has had a fair response to these medications.On initial examination, patient had responded fairly well to IV Ativan and was able to engage well in extended interview.  Using joint decision-making, we set the goals of treating patient's anxiety, helping patient's sleep, and avoiding triggering a manic episode or worsening nausea (do not want to start several serotonergic agents at once).  I explained in detail to patient that as she has been off of her extensive regimen for about a month we will be restarting medication slowly and she may or may not be discharged on her prior outpatient regimen.  please see plan below for detailed recommendations.   7/10: Reports improved anxiety this morning, currently on BID dosing of ativan. No changes to seroquel as patient reports adequate sedation, anxiolytic effects, and sleep on seroquel 100 mg.We will  sign off at this time. Please reconsult if need for ativan taper in future.    Diagnoses:  Active Hospital problems: Principal Problem:   AKI (acute kidney injury) (HCC) Active Problems:   Restless legs syndrome (RLS)   Diabetes mellitus type 2 in nonobese (HCC)   Hypertension associated with type 2 diabetes mellitus (HCC)   Chronic heart failure with preserved ejection fraction (HCC)   Pancolitis (HCC)   Anxiety disorder   Dysphagia   Anxiety     Plan   ## Psychiatric Medication Recommendations:   -- Continue seroquel 100 at bedtime  -- STOP home xanax ( pt on long slow outpt taper) -- START lorazepam 1 qAM 1.5 q PM (equivalent to 0.5/0.75 of xanax)  Hold remainder of psych meds. Needs to totally retitrate lamictal as was off for several weeks as outpt.   ## Medical Decision Making Capacity:  Not formally assessed  ## Further Work-up:  -- none currently   -- most recent EKG on 7/8 had QtC of 440 -- Pertinent labwork reviewed earlier this admission includes: AKI  ## Disposition:  -- There are no current psychiatric contraindications to discharge at this time  ## Behavioral / Environmental:  --   No specific recommendations at this time.    Place patient is very early ## Safety and Observation Level:  - Based on my clinical evaluation, I estimate the patient to be at low risk of self harm in the current setting - At this time, we recommend a routine level of observation. This decision is based on my review of the chart including patient's history and current presentation, interview of the patient, mental status examination, and consideration  of suicide risk including evaluating suicidal ideation, plan, intent, suicidal or self-harm behaviors, risk factors, and protective factors. This judgment is based on our ability to directly address suicide risk, implement suicide prevention strategies and develop a safety plan while the patient is in the clinical setting. Please  contact our team if there is a concern that risk level has changed.    Lorri Frederick, MD  Psychiatric and Social History   Relevant Aspects of Hospital Course:  Admitted on 02/04/2023 for L flank pain, nausea and vomiting. They were extremely anxious in the ED and psychiatry was consulted.   Patient Report:  Patient seen at bedside this AM.  She describes feeling anxious waiting for her morning dose of Ativan, but reports overall improvements in anxiety symptoms with her current regimen.  She describes a restful sleep, has found the combination of Ativan and Seroquel at night helpful.  She request that her Seroquel be kept at 100 mg, as she has found more benefits at that dose than when she was previously on 300 mg.  She has no acute concerns, denies side effects to currently prescribed psychotropic medications.  On assessment, patient denies suicidal ideation.  She denies homicidal ideation.  She denies auditory and visual hallucinations presently.  She denies paranoia and delusional thought processes.  Psych ROS:  As above   Collateral information:  None today  Psychiatric History:  Information collected from pt, EMR.   Prev Dx/Sx: bipolar disorder, anxiety Current Psych Provider: Lowell Guitar, PA-C. Priorly Dr. Tomasa Rand Home Meds (current):  Xanax 0.5-1 qAM and 0.75 qPM (tapering) Wellbutrin 450 qAM Quetiapine 300  mg at bedtime Zoloft 250 every day Lamotrigine 200 BID Previous Med Trials: Trazodone, Risperdal, Zoloft, Lunesta, prazosin, Sonata, Prozac, Depakote, Lamictal, lithium, Wellbutrin, Xanax, Ambien, carbamazepine, Seroquel, Buspar caused falls and hallucinations, Gabapentin-she doesn't want to take   Therapy: not currently  Prior Psych Hospitalization: no  Prior Self Harm: remote SI (decades) Prior Violence: used to throw things at husband when manic (decades)  Family Psych History: father with unspecified dx Family Hx suicide: cousins no 1st degree  relatives  Social History:  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 Finds work deeply rewarding.  Living Situation: Husband and daughter Spiritual Hx: yes, strong spiritual beliefs against suicide Access to weapons: under lock and key. She does not have key  Substance History Tobacco use: no Alcohol use: no Drug use: no   Exam Findings   Psychiatric Specialty Exam:  Presentation  General Appearance: Appropriate for Environment; Casual; Fairly Groomed  Eye Contact:Fair  Speech:Clear and Coherent; Normal Rate  Speech Volume:Normal  Handedness:Right   Mood and Affect  Mood:-- ("I'm doing alright")  Affect:Appropriate; Full Range; Congruent   Thought Process  Thought Processes:Coherent; Goal Directed; Linear  Descriptions of Associations:Intact  Orientation:Full (Time, Place and Person)  Thought Content:Logical; WDL  Hallucinations:Hallucinations: None  Ideas of Reference:None  Suicidal Thoughts:Suicidal Thoughts: No  Homicidal Thoughts:Homicidal Thoughts: No   Sensorium  Memory:Immediate Fair  Judgment:Fair  Insight:Fair   Executive Functions  Concentration:Good  Attention Span:Fair  Recall:Fair  Fund of Knowledge:Fair  Language:Fair   Psychomotor Activity  Psychomotor Activity:Psychomotor Activity: Normal   Assets  Assets:Communication Skills; Desire for Improvement; Resilience   Sleep  Sleep:Sleep: Good    Physical Exam: Vital signs:  Temp:  [98.3 F (36.8 C)-99.2 F (37.3 C)] 98.8 F (37.1 C) (07/11 1552) Pulse Rate:  [79-156] 103 (07/11 1552) Resp:  [18-20] 18 (07/11 0529) BP: (  127-159)/(81-110) 127/81 (07/11 1552) SpO2:  [96 %-100 %] 100 % (07/11 1552) Weight:  [49 kg] 49 kg (07/11 0529) Physical Exam HENT:     Head: Normocephalic.  Pulmonary:     Effort: Pulmonary effort is normal.  Neurological:     Mental Status: She is alert.     Blood pressure  127/81, pulse (!) 103, temperature 98.8 F (37.1 C), resp. rate 18, height 5' (1.524 m), weight 49 kg, SpO2 100%. Body mass index is 21.09 kg/m.   Other History   These have been pulled in through the EMR, reviewed, and updated if appropriate.   Family History:  The patient's family history includes Colon cancer in her mother; Heart failure in her sister; Stroke in her father.  Medical History: Past Medical History:  Diagnosis Date   Anemia    Anxiety    Arthritis    Blood transfusion without reported diagnosis    Cataract    CHF (congestive heart failure) (HCC)    Chronic kidney disease    UTI, hematuria in urine   Colitis    Crohn's disease (HCC)    Depression    Diabetes (HCC)    Diverticulosis    Frequent headaches    Interstitial cystitis    Recurrent UTI    Restless leg syndrome    TIA (transient ischemic attack) 02/20/2021   Urinary frequency     Surgical History: Past Surgical History:  Procedure Laterality Date   bariatric bypass  2012   BIOPSY  05/03/2020   Procedure: BIOPSY;  Surgeon: Rachael Fee, MD;  Location: [facility] ENDOSCOPY;  Service: Endoscopy;;   CARPAL TUNNEL RELEASE Right 2003   CARPAL TUNNEL RELEASE Right    2008   CHOLECYSTECTOMY  1975   COLONOSCOPY     COLONOSCOPY WITH PROPOFOL N/A 05/03/2020   Procedure: COLONOSCOPY WITH PROPOFOL;  Surgeon: Rachael Fee, MD;  Location: [facility] ENDOSCOPY;  Service: Endoscopy;  Laterality: N/A;   CYSTOSCOPY W/ RETROGRADES Bilateral 06/06/2015   Procedure: CYSTOSCOPY WITH RETROGRADE PYELOGRAM;  Surgeon: Jerilee Field, MD;  Location: [facility] ORS;  Service: Urology;  Laterality: Bilateral;   EYE SURGERY Left 07/06/2022   FL INJ LEFT KNEE CT ARTHROGRAM ([facility] HX) Left    1995   GASTRIC BYPASS  2010   HAND SURGERY Left 01/19/2021   Thumb   HEMORRHOID SURGERY  2013   KNEE ARTHROSCOPY Left 1996   TONSILLECTOMY     TOTAL ABDOMINAL HYSTERECTOMY W/ BILATERAL SALPINGOOPHORECTOMY      Medications:   Current  Facility-Administered Medications:    acetaminophen (TYLENOL) tablet 650 mg, 650 mg, Oral, Q6H PRN, 650 mg at 02/06/23 1627 **OR** acetaminophen (TYLENOL) suppository 650 mg, 650 mg, Rectal, Q6H PRN, Jonah Blue, MD   aspirin EC tablet 81 mg, 81 mg, Oral, Daily, Jonah Blue, MD, 81 mg at 02/07/23 1053   bisacodyl (DULCOLAX) EC tablet 5 mg, 5 mg, Oral, Daily PRN, Jonah Blue, MD   feeding supplement (BOOST / RESOURCE BREEZE) liquid 1 Container, 1 Container, Oral, TID BM, Vann, Jessica U, DO   folic acid (FOLVITE) tablet 1 mg, 1 mg, Oral, Daily, Julian Reil, Jared M, DO, 1 mg at 02/07/23 1052   hydrALAZINE (APRESOLINE) injection 5 mg, 5 mg, Intravenous, Q4H PRN, Jonah Blue, MD, 5 mg at 02/05/23 1132   insulin aspart (novoLOG) injection 0-15 Units, 0-15 Units, Subcutaneous, TID WC, Vann, Jessica U, DO   insulin aspart (novoLOG) injection 0-5 Units, 0-5 Units, Subcutaneous, QHS, Vann, Jessica U, DO   LORazepam (  ATIVAN) tablet 1-4 mg, 1-4 mg, Oral, Q1H PRN, 1 mg at 02/07/23 1526 **OR** LORazepam (ATIVAN) injection 1-4 mg, 1-4 mg, Intravenous, Q1H PRN, Hillary Bow, DO, 2 mg at 02/07/23 0349   LORazepam (ATIVAN) tablet 1 mg, 1 mg, Oral, q morning, Cinderella, Margaret A, 1 mg at 02/07/23 1053   LORazepam (ATIVAN) tablet 1.5 mg, 1.5 mg, Oral, QHS, Cinderella, Margaret A, 1.5 mg at 02/06/23 2100   metoprolol tartrate (LOPRESSOR) tablet 25 mg, 25 mg, Oral, BID, Vann, Jessica U, DO, 25 mg at 02/07/23 1053   multivitamin with minerals tablet 1 tablet, 1 tablet, Oral, Daily, Julian Reil, Jared M, DO, 1 tablet at 02/07/23 1053   ondansetron (ZOFRAN) injection 4 mg, 4 mg, Intravenous, Q6H PRN, Jonah Blue, MD, 4 mg at 02/07/23 0337   polyethylene glycol (MIRALAX / GLYCOLAX) packet 17 g, 17 g, Oral, BID, Jonah Blue, MD, 17 g at 02/06/23 2102   potassium chloride SA (KLOR-CON M) CR tablet 40 mEq, 40 mEq, Oral, Q4H, Vann, Jessica U, DO, 40 mEq at 02/07/23 1527   pramipexole (MIRAPEX) tablet 1  mg, 1 mg, Oral, QHS, Jonah Blue, MD, 1 mg at 02/06/23 2100   QUEtiapine (SEROQUEL) tablet 100 mg, 100 mg, Oral, QHS, Cinderella, Margaret A, 100 mg at 02/06/23 2059   rosuvastatin (CRESTOR) tablet 5 mg, 5 mg, Oral, Daily, Jonah Blue, MD, 5 mg at 02/07/23 1053   senna-docusate (Senokot-S) tablet 1 tablet, 1 tablet, Oral, Daily, Jonah Blue, MD, 1 tablet at 02/07/23 1053   sodium chloride flush (NS) 0.9 % injection 3 mL, 3 mL, Intravenous, Q12H, Jonah Blue, MD, 3 mL at 02/07/23 1054   thiamine (VITAMIN B1) tablet 100 mg, 100 mg, Oral, Daily, 100 mg at 02/07/23 1054 **OR** thiamine (VITAMIN B1) injection 100 mg, 100 mg, Intravenous, Daily, Lyda Perone M, DO  Allergies: Allergies  Allergen Reactions   Avelox [Moxifloxacin Hcl In Nacl] Anaphylaxis   Bactrim [Sulfamethoxazole-Trimethoprim] Anaphylaxis   Ciprofloxacin Other (See Comments)    Pt states she was told never to take this as it is in the same family as Avelox.    Buspar [Buspirone] Other (See Comments)    hallucinations   Neomycin-Polymyxin-Dexameth Other (See Comments)    Burning in the eyes   Aquaphor [Lanolin-Petrolatum] Itching and Rash   Depakote [Divalproex Sodium] Other (See Comments)    Unknown Reaction   Imitrex [Sumatriptan] Other (See Comments)    Neck and shoulder pain   Stadol [Butorphanol] Rash

## 2023-02-07 NOTE — Progress Notes (Signed)
Patient ID: JAHLISA ROSSITTO, female   DOB: 1956-07-09, 67 y.o.   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 other than small amounts of soda.  She thinks that if she could have crackers or something similar that she could keep that down, not feeling as nauseated and using Zofran as needed, no significant abdominal pain, still complaining of low back pain  WBC 4.6/hemoglobin 11.7/hematocrit 35.4 Sodium 133/potassium 2.9/creatinine 0.56   Objective   Vital signs in last 24 hours: Temp:  [98.3 F (36.8 C)-99.2 F (37.3 C)] 99.2 F (37.3 C) (07/11 0737) Pulse Rate:  [79-156] 94 (07/11 0737) Resp:  [18-20] 18 (07/11 0529) BP: (141-159)/(92-110) 149/96 (07/11 0737) SpO2:  [96 %-99 %] 96 % (07/11 0737) Weight:  [49 kg] 49 kg (07/11 0529) Last BM Date : 02/06/23 General:    white female in NAD, calm and appropriate Heart:  Regular rate and rhythm; no murmurs Lungs: Respirations even and unlabored, lungs CTA bilaterally Abdomen:  Soft, nontender and nondistended. Normal bowel sounds. Extremities:  Without edema. Neurologic:  Alert and oriented,  grossly normal neurologically. Psych:  Cooperative. Normal mood and affect.  Intake/Output from previous day: 07/10 0701 - 07/11 0700 In: 868.2 [I.V.:868.2] Out: 1 [Urine:1] Intake/Output this shift: No intake/output data recorded.  Lab Results: Recent Labs    02/04/23 1758 02/06/23 0313 02/07/23 0848  WBC 8.2 6.0 4.6  HGB 12.5 12.0 11.7*  HCT 39.5 36.4 35.4*  PLT 346 331 287   BMET Recent Labs    02/04/23 1758 02/06/23 0313 02/07/23 0848  NA 132* 134* 133*  K 3.8 3.1* 2.9*  CL 99 101 100  CO2 21* 24 26  GLUCOSE 159* 165* 100*  BUN 18 <5* <5*  CREATININE 1.34* 0.67 0.56  CALCIUM 8.6*  8.7* 8.3*   LFT Recent Labs    02/06/23 0313  PROT 6.0*  ALBUMIN 2.9*  AST 26  ALT 21  ALKPHOS 193*  BILITOT 0.6   PT/INR No results for input(s): "LABPROT", "INR" in the last 72 hours.    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  Suspect her nausea and vomiting are drug-induced due to combination of Mounjaro, hydrocodone and some component of withdrawal from psychotropics.  She is improving but is still not taking much p.o.   Acute Kidney injury improved  #3 dysphagia-very specific symptom of only noticing that when she is supine after she has gone to bed at night and this occurs very sporadically and infrequently, causes her to panic, gets up and drinks water.  Denies any daytime symptoms EGD October 2023 normal esophagus  I suspect this is functional, but we can do a barium swallow when she is taking p.o.'s well to assure no other structural issues  #4 status  post Roux-en-Y gastric bypass #5 diabetes mellitus 6.  Compensated congestive heart failure 7 bipolar disorder and severe anxiety #8 persistent alkaline phosphatase elevation, she has previously had very mild elevation of AST and/or ALT Check GGT, 5 prime nucleotidase Dr Adela Lank had mentioned liver biopsy at some point  Plan; gradually advance her diet as tolerated, will change to full liquids and add applesauce mashed potatoes crackers etc. she can pick and choose  GGT and 5 prime nucleotidase I think she  would be best served not having an elective liver biopsy done during this admission, when she has been quite sick and extremely anxious with recent panic attacks etc.  We did not discuss liver biopsy today, favor doing this as an outpatient when she is significantly better.     Principal Problem:   AKI (acute kidney injury) (HCC) Active Problems:   Restless legs syndrome (RLS)   Diabetes mellitus type 2 in nonobese [facility])   Hypertension associated with type 2 diabetes mellitus (HCC)   Chronic heart failure with preserved ejection fraction (HCC)   Pancolitis (HCC)   Anxiety disorder   Dysphagia     LOS: 0 days   Olivia Pavelko PA-C 02/07/2023, 11:31 AM

## 2023-02-07 NOTE — Plan of Care (Signed)
  Problem: Activity: Goal: Risk for activity intolerance will decrease 02/07/2023 0441 by Jazelle Achey, [place], RN Outcome: Progressing 02/07/2023 0441 by Cher Egnor, [place], RN Outcome: Progressing   Problem: Nutrition: Goal: Adequate nutrition will be maintained 02/07/2023 0441 by Kerney Hopfensperger, [place], RN Outcome: Progressing 02/07/2023 0441 by Danyel Griess, [place], RN Outcome: Progressing   Problem: Elimination: Goal: Will not experience complications related to bowel motility 02/07/2023 0441 by Grete Bosko, [place], RN Outcome: Progressing 02/07/2023 0441 by Jefferson Fullam, [place], RN Outcome: Progressing   Problem: Elimination: Goal: Will not experience complications related to urinary retention 02/07/2023 0441 by Kip Kautzman, [place], RN Outcome: Progressing 02/07/2023 0441 by Xane Amsden, [place], RN Outcome: Progressing

## 2023-02-07 NOTE — Progress Notes (Signed)
Occupational Therapy Treatment Patient Details Name: Amy Hansen MRN: [id number] DOB: 09-11-1955 Today's Date: 02/07/2023   History of present illness 67 yo female presents to [facility] on 7/8 with n/v x3 weeks, flank pain. Workup for AKI. PMH includes anxiety, HTN, DM, colitis, recurrent falls, HF, TIA.   OT comments  Patient participated well with OT treatment with patient able to manage sling and able to perform grooming tasks with min guard assist standing at sink and made gains with LB dressing. Patient performed mobility in room with no assistive device and occasional IV pole for stability. Discharge recommendations continue to be appropriate. Acute OT to continue to follow.    Recommendations for follow up therapy are one component of a multi-disciplinary discharge planning process, led by the attending physician.  Recommendations may be updated based on patient status, additional functional criteria and insurance authorization.    Assistance Recommended at Discharge Frequent or constant Supervision/Assistance  Patient can return home with the following  A little help with walking and/or transfers;A little help with bathing/dressing/bathroom;Assistance with cooking/housework;Direct supervision/assist for medications management;Direct supervision/assist for financial management;Assist for transportation;Help with stairs or ramp for entrance   Equipment Recommendations  None recommended by OT    Recommendations for Other Services      Precautions / Restrictions Precautions Precautions: Fall Precaution Comments: L humeral fx 12/21/2022 Required Braces or Orthoses: Sling Restrictions Weight Bearing Restrictions: Yes LUE Weight Bearing: Non weight bearing       Mobility Bed Mobility Overal bed mobility: Needs Assistance Bed Mobility: Supine to Sit, Sit to Supine     Supine to sit: Supervision, HOB elevated Sit to supine: Supervision, HOB elevated   General bed mobility  comments: increased time and use of bed rails and cues not to use LUE    Transfers Overall transfer level: Needs assistance Equipment used: None Transfers: Sit to/from Stand Sit to Stand: Min guard           General transfer comment: mobility and transfers performed in room with  min guard and no assistive device     Balance Overall balance assessment: Needs assistance, History of Falls Sitting-balance support: No upper extremity supported, Feet supported Sitting balance-Leahy Scale: Fair     Standing balance support: Single extremity supported, During functional activity, Reliant on assistive device for balance Standing balance-Leahy Scale: Poor                             ADL either performed or assessed with clinical judgement   ADL Overall ADL's : Needs assistance/impaired Eating/Feeding: Independent   Grooming: Wash/dry hands;Wash/dry face;Oral care;Brushing hair;Min guard;Standing       Lower Body Bathing: Supervison/ safety;Sitting/lateral leans Lower Body Bathing Details (indicate cue type and reason): changed socks         Toilet Transfer: Min Pension scheme manager Details (indicate cue type and reason): ambulated to bathroom and simulated transfers           General ADL Comments: Patient tolerated 15 minutes of standing ADL tasks before sitting.    Extremity/Trunk Assessment              Vision       Perception     Praxis      Cognition Arousal/Alertness: Awake/alert Behavior During Therapy: Anxious Overall Cognitive Status: Impaired/Different from baseline Area of Impairment: Memory, Attention, Problem solving  Current Attention Level: Sustained Memory: Decreased short-term memory       Problem Solving: Requires verbal cues, Requires tactile cues General Comments: pleasant and talkative during visit        Exercises      Shoulder Instructions       General Comments      Pertinent  Vitals/ Pain       Pain Assessment Pain Assessment: Faces Faces Pain Scale: Hurts little more Pain Location: LUE Pain Descriptors / Indicators: Grimacing, Guarding Pain Intervention(s): Limited activity within patient's tolerance, Monitored during session, Repositioned  Home Living                                          Prior Functioning/Environment              Frequency  Min 2X/week        Progress Toward Goals  OT Goals(current goals can now be found in the care plan section)  Progress towards OT goals: Progressing toward goals  Acute Rehab OT Goals Patient Stated Goal: go home OT Goal Formulation: With patient Time For Goal Achievement: 02/19/23 Potential to Achieve Goals: Good ADL Goals Pt Will Perform Lower Body Dressing: Independently;sit to/from stand Pt Will Transfer to Toilet: with modified independence;ambulating Additional ADL Goal #1: Pt will tolerate at least 8 minutes of OOB activity to faciliate safe discharge home Additional ADL Goal #2: Pt will follow 3 step way-finding task as a precursor to higher level IADLs  Plan Discharge plan remains appropriate    Co-evaluation                 AM-PAC OT "6 Clicks" Daily Activity     Outcome Measure   Help from another person eating meals?: None Help from another person taking care of personal grooming?: A Little Help from another person toileting, which includes using toliet, bedpan, or urinal?: A Little Help from another person bathing (including washing, rinsing, drying)?: A Little Help from another person to put on and taking off regular upper body clothing?: A Little Help from another person to put on and taking off regular lower body clothing?: A Little 6 Click Score: 19    End of Session Equipment Utilized During Treatment: Gait belt  OT Visit Diagnosis: Unsteadiness on feet (R26.81);Other abnormalities of gait and mobility (R26.89);Muscle weakness (generalized)  (M62.81);History of falling (Z91.81);Repeated falls (R29.6);Pain Pain - Right/Left: Left Pain - part of body: Arm   Activity Tolerance Patient tolerated treatment well   Patient Left in bed;with call bell/phone within reach;with bed alarm set   Nurse Communication Mobility status        Time: 4098-1191 OT Time Calculation (min): 31 min  Charges: OT General Charges $OT Visit: 1 Visit OT Treatments $Self Care/Home Management : 8-22 mins $Therapeutic Activity: 8-22 mins  Alfonse Flavors, OTA Acute Rehabilitation Services  Office [phone]   Dewain Penning 02/07/2023, 12:11 PM

## 2023-02-07 NOTE — Progress Notes (Signed)
PROGRESS NOTE    Amy Hansen  QVZ:[id number] DOB: 1955-11-22 DOA: 02/04/2023 PCP: [name], NP    Brief Narrative:  Amy Hansen is a 67 y.o. female with medical history significant of chronic diastolic CHF, colitis, DM, and TIA presenting with flank pain.  Patient has a pan-positive ROS and reports anxiety most of all.  +headache, + chills, vision changes, "frequent TIAs" with memory loss, tinnitus, dysphagia, weight loss, palpitations, SOB, cough, abdominal pain (not present on exam while distracted), intractable n/v, urinary frequency, rash (not present on exam), bilateral flank pain (not present on exam while distracted), numbness, severe anxiety.  She went to be seen at urology recently and was treated with doxy x 10 days.   Denies further melena    Assessment and Plan: AKI -Patient without baseline compromised renal function  -Current creatinine is increased at least 1.5 times compared to baseline and presumed to have occurred within the last 7 days -Likely due to prerenal secondary to severe dehydration in the setting of dysphagia and n/v  -resolved -no sign of infection so will not give any further abx   Melena -GI consulted -lovenox held -appears resolved  Anxiety -She had a diffusely positive ROS and obvious frank anxiety  -She is on significant psychotropic medications at baseline including alprazolam, bupropion, lamotrigine, Quetiapine, sertraline -Psychiatry consult requested:  start seroquel 100 at bedtime,  change to PO ativan   Dysphagia, N/V, Colitis -She has had significant colitis but this appears to be fairly well controlled on Entyvio - fecal calprotectin and CRP pending -She has a large amount of stool on CT and so likely needs a bowel regimen, which might help her n/v - Senekot S (home med) + Miralax BID -GI consulted   HTN -She does not appear to be taking medications for this issue at this time    DM -She has had significant weight loss  and is quite thin -She was taking Mounjaro but has stopped this (appropriately) -Nutrition consult ordered -SSI while in the hospital   Chronic diastolic CHF -Appears compenstated   HLD -Continue rosuvastatin   Hypokalemia -replete again     DVT prophylaxis: Place and maintain sequential compression device Start: 02/06/23 0138    Code Status: Full Code   Disposition Plan:  Level of care: Med-Surg Status is: inpt    Consultants:  Psych GI   Subjective: Feeling better, asking for diet advancement  Objective: Vitals:   02/06/23 2044 02/07/23 0343 02/07/23 0529 02/07/23 0737  BP: (!) 155/92 (!) 153/110 (!) 159/98 (!) 149/96  Pulse: 79 (!) 118 92 94  Resp: 18 20 18    Temp: 98.5 F (36.9 C) 98.6 F (37 C) 98.9 F (37.2 C) 99.2 F (37.3 C)  TempSrc: Oral  Oral   SpO2: 99% 96% 97% 96%  Weight:   49 kg   Height:        Intake/Output Summary (Last 24 hours) at 02/07/2023 1225 Last data filed at 02/07/2023 0630 Gross per 24 hour  Intake 868.2 ml  Output 1 ml  Net 867.2 ml   Filed Weights   02/04/23 1741 02/07/23 0529  Weight: 49.4 kg 49 kg    Examination:   General: Appearance:    Well developed, well nourished female in no acute distress     Lungs:      respirations unlabored  Heart:    Normal heart rate. Normal rhythm. No murmurs, rubs, or gallops.    MS:   All extremities are intact.  Neurologic:   Awake, alert, oriented x 3. No apparent focal neurological           defect.        Data Reviewed: I have personally reviewed following labs and imaging studies  CBC: Recent Labs  Lab 02/04/23 1518 02/04/23 1758 02/06/23 0313 02/07/23 0848  WBC 9.0 8.2 6.0 4.6  NEUTROABS 6.1  --   --   --   HGB 13.2 12.5 12.0 11.7*  HCT 39.2 39.5 36.4 35.4*  MCV 87 88.6 84.8 86.1  PLT 352 346 331 287   Basic Metabolic Panel: Recent Labs  Lab 02/04/23 1545 02/04/23 1758 02/06/23 0313 02/07/23 0848  NA 134 132* 134* 133*  K 4.1 3.8 3.1* 2.9*  CL  98 99 101 100  CO2 20 21* 24 26  GLUCOSE 114* 159* 165* 100*  BUN 19 18 <5* <5*  CREATININE 1.38* 1.34* 0.67 0.56  CALCIUM 8.6* 8.6* 8.7* 8.3*   GFR: Estimated Creatinine Clearance: 50.4 mL/min (by C-G formula based on SCr of 0.56 mg/dL). Liver Function Tests: Recent Labs  Lab 02/04/23 1545 02/04/23 1758 02/06/23 0313  AST 29 31 26   ALT 22 24 21   ALKPHOS 263* 205* 193*  BILITOT 0.2 0.4 0.6  PROT 6.5 6.5 6.0*  ALBUMIN 3.4* 3.0* 2.9*   Recent Labs  Lab 02/04/23 1545 02/04/23 1758  LIPASE 34 32  AMYLASE 47  --    No results for input(s): "AMMONIA" in the last 168 hours. Coagulation Profile: No results for input(s): "INR", "PROTIME" in the last 168 hours. Cardiac Enzymes: Recent Labs  Lab 02/05/23 0450  CKTOTAL 72   BNP (last 3 results) No results for input(s): "PROBNP" in the last 8760 hours. HbA1C: Recent Labs    02/04/23 1510  HGBA1C 6.5*   CBG: Recent Labs  Lab 02/04/23 2235 02/05/23 0225 02/06/23 1258 02/06/23 2047 02/07/23 0813  GLUCAP 158* 240* 157* 161* 102*   Lipid Profile: Recent Labs    02/04/23 1545  CHOL 112  HDL 44  LDLCALC 37  TRIG 192*   Thyroid Function Tests: No results for input(s): "TSH", "T4TOTAL", "FREET4", "T3FREE", "THYROIDAB" in the last 72 hours. Anemia Panel: Recent Labs    02/04/23 1545  VITAMINB12 345   Sepsis Labs: Recent Labs  Lab 02/05/23 0450 02/05/23 0622  LATICACIDVEN 2.1* 1.8    Recent Results (from the past 240 hour(s))  Microscopic Examination     Status: Abnormal   Collection Time: 02/04/23  3:17 PM   Urine  Result Value Ref Range Status   WBC, UA 6-10 (A) 0 - 5 /hpf Final   RBC, Urine 0-2 0 - 2 /hpf Final   Epithelial Cells (non renal) 0-10 0 - 10 /hpf Final   Casts Present (A) None seen /lpf Final   Cast Type Hyaline casts N/A Final   Bacteria, UA None seen None seen/Few Final  Blood culture (routine x 2)     Status: None (Preliminary result)   Collection Time: 02/05/23  4:50 AM    Specimen: BLOOD LEFT FOREARM  Result Value Ref Range Status   Specimen Description BLOOD LEFT FOREARM  Final   Special Requests   Final    BOTTLES DRAWN AEROBIC AND ANAEROBIC Blood Culture adequate volume   Culture   Final    NO GROWTH 2 DAYS Performed at [facility], [address], [place], [place] [address]    Report Status PENDING  Incomplete  Blood culture (routine x 2)  Status: None (Preliminary result)   Collection Time: 02/05/23  5:10 AM   Specimen: BLOOD RIGHT FOREARM  Result Value Ref Range Status   Specimen Description BLOOD RIGHT FOREARM  Final   Special Requests   Final    BOTTLES DRAWN AEROBIC AND ANAEROBIC Blood Culture adequate volume   Culture   Final    NO GROWTH 2 DAYS Performed at [facility], [address], [place], [place] [address]    Report Status PENDING  Incomplete         Radiology Studies: No results found.      Scheduled Meds:  aspirin EC  81 mg Oral Daily   feeding supplement  1 Container Oral TID BM   folic acid  1 mg Oral Daily   insulin aspart  0-15 Units Subcutaneous TID WC   insulin aspart  0-5 Units Subcutaneous QHS   LORazepam  1 mg Oral q morning   LORazepam  1.5 mg Oral QHS   methocarbamol  500 mg Oral Once   metoprolol tartrate  25 mg Oral BID   multivitamin with minerals  1 tablet Oral Daily   polyethylene glycol  17 g Oral BID   potassium chloride  40 mEq Oral Q4H   pramipexole  1 mg Oral QHS   QUEtiapine  100 mg Oral QHS   rosuvastatin  5 mg Oral Daily   senna-docusate  1 tablet Oral Daily   sodium chloride flush  3 mL Intravenous Q12H   thiamine  100 mg Oral Daily   Or   thiamine  100 mg Intravenous Daily   Continuous Infusions:     LOS: 0 days    Time spent: 45 minutes spent on chart review, discussion with nursing staff, consultants, updating family and interview/physical exam; more than 50% of that time was spent in counseling and/or coordination of care.    Joseph Art, DO [facility] Available via Epic secure chat 7am-7pm After these hours, please refer to coverage provider listed on [email or web address] 02/07/2023, 12:25 PM

## 2023-02-08 ENCOUNTER — Encounter: Payer: Self-pay | Admitting: Oncology

## 2023-02-08 ENCOUNTER — Encounter: Payer: Self-pay | Admitting: Gastroenterology

## 2023-02-08 ENCOUNTER — Ambulatory Visit: Payer: BC Managed Care – PPO | Admitting: Physician Assistant

## 2023-02-08 ENCOUNTER — Other Ambulatory Visit (HOSPITAL_COMMUNITY): Payer: Self-pay

## 2023-02-08 DIAGNOSIS — N179 Acute kidney failure, unspecified: Secondary | ICD-10-CM | POA: Diagnosis not present

## 2023-02-08 LAB — BASIC METABOLIC PANEL
Anion gap: 8 (ref 5–15)
BUN: <5 mg/dL — ABNORMAL LOW (ref 8–23)
CO2: 25 mmol/L (ref 22–32)
Calcium: 8.4 mg/dL — ABNORMAL LOW (ref 8.9–10.3)
Chloride: 100 mmol/L (ref 98–111)
Creatinine, Ser: 0.65 mg/dL (ref 0.44–1.00)
GFR, Estimated: >60 mL/min (ref >60)
Glucose, Bld: 126 mg/dL — ABNORMAL HIGH (ref 70–99)
Potassium: 3.9 mmol/L (ref 3.5–5.1)
Sodium: 133 mmol/L — ABNORMAL LOW (ref 135–145)

## 2023-02-08 LAB — URINE CULTURE

## 2023-02-08 LAB — CULTURE, BLOOD (ROUTINE X 2): Culture: NO GROWTH

## 2023-02-08 LAB — OCCULT BLOOD X 1 CARD TO LAB, STOOL: Fecal Occult Bld: POSITIVE — AB

## 2023-02-08 LAB — GLUCOSE, CAPILLARY: Glucose-Capillary: 136 mg/dL — ABNORMAL HIGH (ref 70–99)

## 2023-02-08 LAB — NUCLEOTIDASE, 5', BLOOD: 5-Nucleotidase: 4 IU/L (ref 0–11)

## 2023-02-08 MED ORDER — METOPROLOL TARTRATE 25 MG PO TABS
25.0000 mg | ORAL_TABLET | Freq: Two times a day (BID) | ORAL | 0 refills | Status: DC
  Filled 2023-02-08: qty 60, 30d supply, fill #0

## 2023-02-08 MED ORDER — LORAZEPAM 0.5 MG PO TABS
ORAL_TABLET | ORAL | 0 refills | Status: DC
  Filled 2023-02-08: qty 15, 3d supply, fill #0

## 2023-02-08 MED ORDER — QUETIAPINE FUMARATE 100 MG PO TABS
100.0000 mg | ORAL_TABLET | Freq: Every day | ORAL | 0 refills | Status: DC
  Filled 2023-02-08: qty 30, 30d supply, fill #0

## 2023-02-08 MED ORDER — POLYETHYLENE GLYCOL 3350 17 G PO PACK: 17.0000 g | PACK | Freq: Two times a day (BID) | ORAL | Status: DC

## 2023-02-08 NOTE — Discharge Summary (Signed)
Physician Discharge Summary  Amy Hansen:[id number] DOB: 1956-03-02 DOA: 02/04/2023  PCP: [name], NP  Admit date: 02/04/2023 Discharge date: 02/08/2023  Admitted From: home Discharge disposition: home   Recommendations for Outpatient Follow-Up:   Keep appointment Monday with psych to continue ativan script Bowel regimen patient would need to hold aspirin for 5 days, inpatient liver biopsy not feasible. This can be done non-urgently as outpatient.    Discharge Diagnosis:   Principal Problem:   AKI (acute kidney injury) (HCC) Active Problems:   Restless legs syndrome (RLS)   Diabetes mellitus type 2 in nonobese (HCC)   Hypertension associated with type 2 diabetes mellitus (HCC)   Chronic heart failure with preserved ejection fraction (HCC)   Pancolitis (HCC)   Anxiety disorder   Dysphagia   Anxiety    Discharge Condition: Improved.  Diet recommendation: Regular.  Wound care: None.  Code status: Full.   History of Present Illness:   Amy Hansen is a 67 y.o. female with medical history significant of chronic diastolic CHF, colitis, DM, and TIA presenting with flank pain.  Patient has a pan-positive ROS and reports anxiety most of all.  +headache, + chills, vision changes, "frequent TIAs" with memory loss, tinnitus, dysphagia, weight loss, palpitations, SOB, cough, abdominal pain (not present on exam while distracted), intractable n/v, urinary frequency, rash (not present on exam), bilateral flank pain (not present on exam while distracted), numbness, severe anxiety.  She went to be seen at urology recently and was treated with doxy x 10 days.  She also went to urgent care but they turned her away when they learned that GI told her to go to the ER.    Hospital Course by Problem:   AKI -Patient without baseline compromised renal function  -Current creatinine is increased at least 1.5 times compared to baseline and presumed to have occurred within  the last 7 days -Likely due to prerenal secondary to severe dehydration in the setting of dysphagia and n/v  -resolved -no sign of infection so will not give any further abx   Melena -GI consulted -appears resolved   Anxiety -She had a diffusely positive ROS and obvious frank anxiety  -She is on significant psychotropic medications at baseline including alprazolam, bupropion, lamotrigine, Quetiapine, sertraline -Psychiatry consult requested:  start seroquel 100 at bedtime,  change to PO ativan   Dysphagia, N/V, Colitis -She has had significant colitis but this appears to be fairly well controlled on Entyvio -She has a large amount of stool on CT and so likely needs a bowel regimen, which might help her n/v - Senekot S (home med) + Miralax BID -GI consulted   HTN -added BB for now -adjust as needed   DM -She has had significant weight loss and is quite thin -She was taking Mounjaro but has stopped this (appropriately) -Nutrition consult ordered -SSI while in the hospital   Chronic diastolic CHF -Appears compenstated   HLD -Continue rosuvastatin   Hypokalemia -repleted again    Medical Consultants:   psych GI  Discharge Exam:   Vitals:   02/08/23 0529 02/08/23 0718  BP: 133/87 (!) 143/91  Pulse: 82 89  Resp: 18 18  Temp: 98.3 F (36.8 C) 98.1 F (36.7 C)  SpO2: 100% 99%   Vitals:   02/07/23 1935 02/08/23 0025 02/08/23 0529 02/08/23 0718  BP: 120/84 127/87 133/87 (!) 143/91  Pulse: 90 85 82 89  Resp: 18 18 18 18   Temp: 98.6 F (  37 C) 98 F (36.7 C) 98.3 F (36.8 C) 98.1 F (36.7 C)  TempSrc: Oral Oral Oral Oral  SpO2: 97% 99% 100% 99%  Weight:   49.3 kg   Height:        General exam: Appears calm and comfortable.    The results of significant diagnostics from this hospitalization (including imaging, microbiology, ancillary and laboratory) are listed below for reference.     Procedures and Diagnostic Studies:   DG Chest 2 View  Result  Date: 02/05/2023 CLINICAL DATA:  67 year old female with vomiting, weakness. EXAM: CHEST - 2 VIEW COMPARISON:  Chest radiograph 09/26/2022 and earlier. FINDINGS: Seated upright AP and lateral views of the chest at 0638 hours. Lung volumes and mediastinal contours remain normal. Visualized tracheal air column is within normal limits. Lung markings do not appear significantly changed from 2013 radiographs, with no pneumothorax, pulmonary edema, pleural effusion, or confluent lung opacity. Subacute or chronic appearing deformity of the left humeral neck. Periosteal new bone formation there. Fracture there was demonstrated on left humerus series 12/21/2022. No new osseous abnormality identified. Negative visible bowel gas. IMPRESSION: 1. No acute cardiopulmonary abnormality. 2. Proximal left humerus fracture which occurred in May. Electronically Signed   By: Odessa Fleming M.D.   On: 02/05/2023 06:53   CT HEAD WO CONTRAST ( )  Result Date: 02/05/2023 CLINICAL DATA:  Multiple falls, facial trauma EXAM: CT HEAD WITHOUT CONTRAST TECHNIQUE: Contiguous axial images were obtained from the base of the skull through the vertex without intravenous contrast. RADIATION DOSE REDUCTION: This exam was performed according to the departmental dose-optimization program which includes automated exposure control, adjustment of the mA and/or kV according to patient size and/or use of iterative reconstruction technique. COMPARISON:  12/14/2022 CTA head and neck FINDINGS: Brain: No evidence of acute infarction, hemorrhage, mass, mass effect, or midline shift. No hydrocephalus or extra-axial fluid collection. Periventricular white matter changes, likely the sequela of chronic small vessel ischemic disease. Vascular: No hyperdense vessel. Atherosclerotic calcifications in the intracranial carotid and vertebral arteries. Skull: Negative for fracture or focal lesion. Sinuses/Orbits: No acute finding. Other: The mastoid air cells are well aerated.  IMPRESSION: No acute intracranial process. Electronically Signed   By: Wiliam Ke M.D.   On: 02/05/2023 03:17   CT Renal Stone Study  Result Date: 02/05/2023 CLINICAL DATA:  Abdominal and flank pain vomiting and chills. Evaluate for pancreatitis. EXAM: CT ABDOMEN AND PELVIS WITHOUT CONTRAST TECHNIQUE: Multidetector CT imaging of the abdomen and pelvis was performed following the standard protocol without IV contrast. RADIATION DOSE REDUCTION: This exam was performed according to the departmental dose-optimization program which includes automated exposure control, adjustment of the mA and/or kV according to patient size and/or use of iterative reconstruction technique. COMPARISON:  CT abdomen and pelvis 11/13/2022 FINDINGS: Lower chest: No acute abnormality. Hepatobiliary: No focal liver abnormality is seen. Status post cholecystectomy. No biliary dilatation. Pancreas: Unremarkable. No pancreatic ductal dilatation or surrounding inflammatory changes. Spleen: Normal in size without focal abnormality. Adrenals/Urinary Tract: There is a 6 mm calculus in the left kidney. There is no hydronephrosis or perinephric fluid. Otherwise, the kidneys, adrenal glands, and bladder are within normal limits. 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 There is no evidence for bowel obstruction, pneumatosis or free air. The appendix is not visualized. There is a large amount of stool throughout the colon. Scattered colonic diverticula are present. Vascular/Lymphatic: Aortic atherosclerosis. No enlarged abdominal or pelvic lymph nodes. Reproductive: Status post hysterectomy. No adnexal masses. Other: No abdominal wall  hernia or abnormality. No abdominopelvic ascites. 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 L2-L3 degenerative endplate changes are similar to the prior study. IMPRESSION: 1. No acute localizing process in the abdomen or pelvis. 2. Nonobstructing left renal calculus. 3. Gastric  bypass changes. Aortic Atherosclerosis (ICD10-I70.0). Electronically Signed   By: Darliss Cheney M.D.   On: 02/05/2023 02:27   DG Abdomen 1 View  Result Date: 02/04/2023 CLINICAL DATA:  Evaluate for obstruction.  Vomiting EXAM: ABDOMEN - 1 VIEW COMPARISON:  None Available. FINDINGS: Nonobstructive bowel gas pattern. No organomegaly or free air. No suspicious calcification or acute bony abnormality. Levoscoliosis in the upper lumbar spine. IMPRESSION: No evidence of bowel obstruction or free air. Electronically Signed   By: Charlett Nose M.D.   On: 02/04/2023 19:00     Labs:   Basic Metabolic Panel: Recent Labs  Lab 02/04/23 1545 02/04/23 1758 02/06/23 0313 02/07/23 0848 02/07/23 1206 02/08/23 0715  NA 134 132* 134* 133*  --  133*  K 4.1 3.8 3.1* 2.9*  --  3.9  CL 98 99 101 100  --  100  CO2 20 21* 24 26  --  25  GLUCOSE 114* 159* 165* 100*  --  126*  BUN 19 18 <5* <5*  --  <5*  CREATININE 1.38* 1.34* 0.67 0.56  --  0.65  CALCIUM 8.6* 8.6* 8.7* 8.3*  --  8.4*  MG  --   --   --   --  1.8  --    GFR Estimated Creatinine Clearance: 50.4 mL/min (by C-G formula based on SCr of 0.65 mg/dL). Liver Function Tests: Recent Labs  Lab 02/04/23 1545 02/04/23 1758 02/06/23 0313  AST 29 31 26   ALT 22 24 21   ALKPHOS 263* 205* 193*  BILITOT 0.2 0.4 0.6  PROT 6.5 6.5 6.0*  ALBUMIN 3.4* 3.0* 2.9*   Recent Labs  Lab 02/04/23 1545 02/04/23 1758  LIPASE 34 32  AMYLASE 47  --    No results for input(s): "AMMONIA" in the last 168 hours. Coagulation profile No results for input(s): "INR", "PROTIME" in the last 168 hours.  CBC: Recent Labs  Lab 02/04/23 1518 02/04/23 1758 02/06/23 0313 02/07/23 0848  WBC 9.0 8.2 6.0 4.6  NEUTROABS 6.1  --   --   --   HGB 13.2 12.5 12.0 11.7*  HCT 39.2 39.5 36.4 35.4*  MCV 87 88.6 84.8 86.1  PLT 352 346 331 287   Cardiac Enzymes: Recent Labs  Lab 02/05/23 0450  CKTOTAL 72   BNP: Invalid input(s): "POCBNP" CBG: Recent Labs  Lab  02/06/23 2047 02/07/23 0813 02/07/23 1703 02/07/23 1942 02/08/23 0720  GLUCAP 161* 102* 184* 82 136*   D-Dimer No results for input(s): "DDIMER" in the last 72 hours. Hgb A1c No results for input(s): "HGBA1C" in the last 72 hours. Lipid Profile No results for input(s): "CHOL", "HDL", "LDLCALC", "TRIG", "CHOLHDL", "LDLDIRECT" in the last 72 hours. Thyroid function studies No results for input(s): "TSH", "T4TOTAL", "T3FREE", "THYROIDAB" in the last 72 hours.  Invalid input(s): "FREET3" Anemia work up No results for input(s): "VITAMINB12", "FOLATE", "FERRITIN", "TIBC", "IRON", "RETICCTPCT" in the last 72 hours. Microbiology Recent Results (from the past 240 hour(s))  Microscopic Examination     Status: Abnormal   Collection Time: 02/04/23  3:17 PM   Urine  Result Value Ref Range Status   WBC, UA 6-10 (A) 0 - 5 /hpf Final   RBC, Urine 0-2 0 - 2 /hpf Final   Epithelial  Cells (non renal) 0-10 0 - 10 /hpf Final   Casts Present (A) None seen /lpf Final   Cast Type Hyaline casts N/A Final   Bacteria, UA None seen None seen/Few Final  Blood culture (routine x 2)     Status: None (Preliminary result)   Collection Time: 02/05/23  4:50 AM   Specimen: BLOOD LEFT FOREARM  Result Value Ref Range Status   Specimen Description BLOOD LEFT FOREARM  Final   Special Requests   Final    BOTTLES DRAWN AEROBIC AND ANAEROBIC Blood Culture adequate volume   Culture   Final    NO GROWTH 3 DAYS Performed at [facility], [address], [place], [place] [address]    Report Status PENDING  Incomplete  Blood culture (routine x 2)     Status: None (Preliminary result)   Collection Time: 02/05/23  5:10 AM   Specimen: BLOOD RIGHT FOREARM  Result Value Ref Range Status   Specimen Description BLOOD RIGHT FOREARM  Final   Special Requests   Final    BOTTLES DRAWN AEROBIC AND ANAEROBIC Blood Culture adequate volume   Culture   Final    NO GROWTH 3 DAYS Performed at [facility], [address], [place], [place] [address]    Report Status PENDING  Incomplete  Urine Culture     Status: Abnormal (Preliminary result)   Collection Time: 02/05/23  8:10 AM   Specimen: Urine   UR  Result Value Ref Range Status   Urine Culture, Routine Preliminary report (A)  Preliminary   Organism ID, Bacteria Proteus mirabilis (A)  Preliminary    Comment: 25,000-50,000 colony forming units per mL     Discharge Instructions:   Discharge Instructions     Diet general   Complete by: As directed    Increase activity slowly   Complete by: As directed       Allergies as of 02/08/2023       Reactions   Avelox [moxifloxacin Hcl In Nacl] Anaphylaxis   Bactrim [sulfamethoxazole-trimethoprim] Anaphylaxis   Ciprofloxacin Other (See Comments)   Pt states she was told never to take this as it is in the same family as Avelox.    Buspar [buspirone] Other (See Comments)   hallucinations   Neomycin-polymyxin-dexameth Other (See Comments)   Burning in the eyes   Aquaphor [lanolin-petrolatum] Itching, Rash   Depakote [divalproex Sodium] Other (See Comments)   Unknown Reaction   Imitrex [sumatriptan] Other (See Comments)   Neck and shoulder pain   Stadol [butorphanol] Rash        Medication List     STOP taking these medications    ALPRAZolam 0.5 MG tablet Commonly known as: Xanax   buPROPion 150 MG 24 hr tablet Commonly known as: WELLBUTRIN XL   lamoTRIgine 200 MG tablet Commonly known as: LAMICTAL   Premarin vaginal cream Generic drug: conjugated estrogens   rOPINIRole 1 MG tablet Commonly known as: REQUIP   sertraline 100 MG tablet Commonly known as: ZOLOFT       TAKE these medications    aspirin EC 81 MG tablet Take 1 tablet (81 mg) by mouth once daily. Swallow whole.   furosemide 20 MG tablet Commonly known as: LASIX Take 1 tablet (20 mg total) by mouth daily. May take an additional tablet (20mg ) AS NEEDED for weight gain and/or swelling.   LORazepam 0.5 MG  tablet Commonly known as: ATIVAN 1mg  q AM and 1.5mg  q PM Start taking on:  February 09, 2023   metoprolol tartrate 25 MG tablet Commonly known as: LOPRESSOR Take 1 tablet (25 mg total) by mouth 2 (two) times daily.   ondansetron 8 MG disintegrating tablet Commonly known as: ZOFRAN-ODT Take 1 tablet (8 mg total) by mouth every 8 (eight) hours as needed for nausea or vomiting.   polyethylene glycol 17 g packet Commonly known as: MIRALAX / GLYCOLAX Take 17 g by mouth 2 (two) times daily.   pramipexole 1 MG tablet Commonly known as: MIRAPEX Take 1 mg by mouth at bedtime.   promethazine 25 MG tablet Commonly known as: PHENERGAN Take 1 tablet (25 mg total) by mouth every 8 (eight) hours as needed for nausea or vomiting.   QUEtiapine 100 MG tablet Commonly known as: SEROQUEL Take 1 tablet (100 mg total) by mouth at bedtime. What changed:  medication strength how much to take   rosuvastatin 5 MG tablet Commonly known as: CRESTOR Take 1 tablet by mouth once daily   senna-docusate 8.6-50 MG tablet Commonly known as: Senokot-S Take 1 tablet by mouth daily.   vedolizumab 300 MG injection Commonly known as: ENTYVIO Inject into the vein. Every 4 weeks        Follow-up Information     [facility], [facility] Follow up.   Specialty: Home Health Services Why: [facility] will be providing home health services.  They will call you in the next 24-48 hours to set up services. Contact information: [address] [place] [address] [phone]                  Time coordinating discharge: 45 min  Signed:  Joseph Art DO  [facility] 02/08/2023, 11:40 AM

## 2023-02-08 NOTE — Progress Notes (Signed)
Admitted to hospital at this time with pyelo.

## 2023-02-08 NOTE — Plan of Care (Signed)
  Problem: Pain Managment: Goal: General experience of comfort will improve Outcome: Progressing   Problem: Skin Integrity: Goal: Risk for impaired skin integrity will decrease Outcome: Progressing   Problem: Coping: Goal: Ability to adjust to condition or change in health will improve Outcome: Progressing

## 2023-02-09 LAB — CULTURE, BLOOD (ROUTINE X 2)

## 2023-02-10 ENCOUNTER — Telehealth: Payer: Self-pay | Admitting: Physician Assistant

## 2023-02-10 ENCOUNTER — Emergency Department: Payer: BC Managed Care – PPO

## 2023-02-10 ENCOUNTER — Other Ambulatory Visit: Payer: Self-pay

## 2023-02-10 DIAGNOSIS — N200 Calculus of kidney: Secondary | ICD-10-CM | POA: Insufficient documentation

## 2023-02-10 DIAGNOSIS — Z9884 Bariatric surgery status: Secondary | ICD-10-CM | POA: Diagnosis not present

## 2023-02-10 DIAGNOSIS — R1032 Left lower quadrant pain: Secondary | ICD-10-CM | POA: Diagnosis not present

## 2023-02-10 DIAGNOSIS — Z8673 Personal history of transient ischemic attack (TIA), and cerebral infarction without residual deficits: Secondary | ICD-10-CM | POA: Insufficient documentation

## 2023-02-10 DIAGNOSIS — I700 Atherosclerosis of aorta: Secondary | ICD-10-CM | POA: Diagnosis not present

## 2023-02-10 DIAGNOSIS — Z79899 Other long term (current) drug therapy: Secondary | ICD-10-CM | POA: Insufficient documentation

## 2023-02-10 DIAGNOSIS — I517 Cardiomegaly: Secondary | ICD-10-CM | POA: Insufficient documentation

## 2023-02-10 DIAGNOSIS — R7989 Other specified abnormal findings of blood chemistry: Secondary | ICD-10-CM

## 2023-02-10 DIAGNOSIS — K5730 Diverticulosis of large intestine without perforation or abscess without bleeding: Secondary | ICD-10-CM | POA: Insufficient documentation

## 2023-02-10 DIAGNOSIS — R1012 Left upper quadrant pain: Secondary | ICD-10-CM | POA: Diagnosis not present

## 2023-02-10 DIAGNOSIS — K5090 Crohn's disease, unspecified, without complications: Secondary | ICD-10-CM | POA: Insufficient documentation

## 2023-02-10 DIAGNOSIS — I672 Cerebral atherosclerosis: Secondary | ICD-10-CM | POA: Diagnosis not present

## 2023-02-10 DIAGNOSIS — R200 Anesthesia of skin: Secondary | ICD-10-CM | POA: Diagnosis not present

## 2023-02-10 DIAGNOSIS — K449 Diaphragmatic hernia without obstruction or gangrene: Secondary | ICD-10-CM | POA: Insufficient documentation

## 2023-02-10 DIAGNOSIS — E119 Type 2 diabetes mellitus without complications: Secondary | ICD-10-CM | POA: Diagnosis not present

## 2023-02-10 DIAGNOSIS — R000 Tachycardia, unspecified: Secondary | ICD-10-CM | POA: Diagnosis not present

## 2023-02-10 LAB — CBC WITH DIFFERENTIAL/PLATELET
Abs Immature Granulocytes: 0.04 10*3/uL (ref 0.00–0.07)
Basophils Absolute: 0 10*3/uL (ref 0.0–0.1)
Basophils Relative: 0 %
Eosinophils Absolute: 0.1 10*3/uL (ref 0.0–0.5)
Eosinophils Relative: 1 %
HCT: 35.2 % — ABNORMAL LOW (ref 36.0–46.0)
Hemoglobin: 11.4 g/dL — ABNORMAL LOW (ref 12.0–15.0)
Immature Granulocytes: 0 %
Lymphocytes Relative: 15 %
Lymphs Abs: 1.8 10*3/uL (ref 0.7–4.0)
MCH: 28.5 pg (ref 26.0–34.0)
MCHC: 32.4 g/dL (ref 30.0–36.0)
MCV: 88 fL (ref 80.0–100.0)
Monocytes Absolute: 0.5 10*3/uL (ref 0.1–1.0)
Monocytes Relative: 4 %
Neutro Abs: 9.5 10*3/uL — ABNORMAL HIGH (ref 1.7–7.7)
Neutrophils Relative %: 80 %
Platelets: 308 10*3/uL (ref 150–400)
RBC: 4 MIL/uL (ref 3.87–5.11)
RDW: 13.5 % (ref 11.5–15.5)
WBC: 11.9 10*3/uL — ABNORMAL HIGH (ref 4.0–10.5)
nRBC: 0 % (ref 0.0–0.2)

## 2023-02-10 LAB — URINALYSIS, ROUTINE W REFLEX MICROSCOPIC
Bilirubin Urine: NEGATIVE
Glucose, UA: 50 mg/dL — AB
Hgb urine dipstick: NEGATIVE
Ketones, ur: NEGATIVE mg/dL
Nitrite: NEGATIVE
Protein, ur: NEGATIVE mg/dL
Specific Gravity, Urine: 1.001 — ABNORMAL LOW (ref 1.005–1.030)
pH: 6 (ref 5.0–8.0)

## 2023-02-10 LAB — CULTURE, BLOOD (ROUTINE X 2)
Culture: NO GROWTH
Special Requests: ADEQUATE
Special Requests: ADEQUATE

## 2023-02-10 LAB — COMPREHENSIVE METABOLIC PANEL
ALT: 15 U/L (ref 0–44)
AST: 15 U/L (ref 15–41)
Albumin: 3 g/dL — ABNORMAL LOW (ref 3.5–5.0)
Alkaline Phosphatase: 158 U/L — ABNORMAL HIGH (ref 38–126)
Anion gap: 6 (ref 5–15)
BUN: <5 mg/dL — ABNORMAL LOW (ref 8–23)
CO2: 24 mmol/L (ref 22–32)
Calcium: 8.2 mg/dL — ABNORMAL LOW (ref 8.9–10.3)
Chloride: 107 mmol/L (ref 98–111)
Creatinine, Ser: 0.58 mg/dL (ref 0.44–1.00)
GFR, Estimated: >60 mL/min (ref >60)
Glucose, Bld: 165 mg/dL — ABNORMAL HIGH (ref 70–99)
Potassium: 2.6 mmol/L — CL (ref 3.5–5.1)
Sodium: 137 mmol/L (ref 135–145)
Total Bilirubin: 0.6 mg/dL (ref 0.3–1.2)
Total Protein: 6.1 g/dL — ABNORMAL LOW (ref 6.5–8.1)

## 2023-02-10 LAB — URINE CULTURE

## 2023-02-10 LAB — CBG MONITORING, ED: Glucose-Capillary: 144 mg/dL — ABNORMAL HIGH (ref 70–99)

## 2023-02-10 NOTE — Telephone Encounter (Signed)
Patient called this afternoon with recurrence of nausea and vomiting today She was just discharged from the hospital on Friday after 5-day stay with intractable nausea vomiting, acute kidney injury etc.  Please see hospital notes for details  She has Zofran at home but says that does not work for her, she is very anxious this afternoon with recurrence of the nausea and vomiting, took 1 Xanax and says she feels a little bit better She is hoping not to have to go back to the emergency room as she had a long wait last time and she also has an appointment with her psychiatrist tomorrow morning which she needs to keep  I have called in Phenergan suppositories 25 mg, use rectally twice daily as needed, 1 box/no refills to [place], [employer].  Patient knows if she cannot keep any p.o.'s down by tomorrow she may need to come back to the emergency room

## 2023-02-10 NOTE — ED Triage Notes (Signed)
Patient presents with numbness around her lips and to the RIGHT side of her face that started approx. 1 hour ago; Patient does have history of TIA's; She took her nightly Seroquel and Xanax before she came in tonight and her speech is slow and slightly slurred (family says that this is how she typically speaks after taking these medications); Spoke with ED physician about patient, no code stroke initiated at this time

## 2023-02-10 NOTE — Telephone Encounter (Signed)
Thanks Amy. Agree with your plan. Hopefully she improves, if not she should call us back tomorrow for further advice

## 2023-02-11 ENCOUNTER — Encounter: Payer: Medicare Other | Admitting: Physician Assistant

## 2023-02-11 ENCOUNTER — Ambulatory Visit (INDEPENDENT_AMBULATORY_CARE_PROVIDER_SITE_OTHER): Payer: Self-pay | Admitting: Physician Assistant

## 2023-02-11 ENCOUNTER — Telehealth: Payer: Self-pay | Admitting: *Deleted

## 2023-02-11 ENCOUNTER — Encounter: Payer: Self-pay | Admitting: Oncology

## 2023-02-11 ENCOUNTER — Emergency Department: Payer: BC Managed Care – PPO

## 2023-02-11 ENCOUNTER — Emergency Department
Admission: EM | Admit: 2023-02-11 | Discharge: 2023-02-11 | Disposition: A | Discharge status: Home / Self Care | Payer: BC Managed Care – PPO | Attending: Emergency Medicine | Admitting: Emergency Medicine

## 2023-02-11 ENCOUNTER — Encounter: Payer: Self-pay | Admitting: Gastroenterology

## 2023-02-11 DIAGNOSIS — K449 Diaphragmatic hernia without obstruction or gangrene: Secondary | ICD-10-CM | POA: Diagnosis not present

## 2023-02-11 DIAGNOSIS — R1032 Left lower quadrant pain: Secondary | ICD-10-CM | POA: Diagnosis not present

## 2023-02-11 DIAGNOSIS — Z91199 Patient's noncompliance with other medical treatment and regimen due to unspecified reason: Secondary | ICD-10-CM

## 2023-02-11 DIAGNOSIS — N200 Calculus of kidney: Secondary | ICD-10-CM | POA: Diagnosis not present

## 2023-02-11 DIAGNOSIS — K5730 Diverticulosis of large intestine without perforation or abscess without bleeding: Secondary | ICD-10-CM | POA: Diagnosis not present

## 2023-02-11 DIAGNOSIS — R1012 Left upper quadrant pain: Secondary | ICD-10-CM

## 2023-02-11 LAB — MAGNESIUM: Magnesium: 2.1 mg/dL (ref 1.7–2.4)

## 2023-02-11 MED ORDER — SODIUM CHLORIDE 0.9 % IV BOLUS
1000.0000 mL | Freq: Once | INTRAVENOUS | Status: AC
  Administered 2023-02-11: 1000 mL via INTRAVENOUS

## 2023-02-11 MED ORDER — ONDANSETRON HCL 4 MG/2ML IJ SOLN
4.0000 mg | Freq: Once | INTRAMUSCULAR | Status: AC
  Administered 2023-02-11: 4 mg via INTRAVENOUS
  Filled 2023-02-11: qty 2

## 2023-02-11 MED ORDER — FAMOTIDINE 20 MG PO TABS: 20.0000 mg | ORAL_TABLET | Freq: Two times a day (BID) | ORAL | 0 refills | Status: DC

## 2023-02-11 MED ORDER — IOHEXOL 300 MG/ML  SOLN
80.0000 mL | Freq: Once | INTRAMUSCULAR | Status: AC | PRN
  Administered 2023-02-11: 80 mL via INTRAVENOUS

## 2023-02-11 MED ORDER — POTASSIUM CHLORIDE 20 MEQ PO PACK
60.0000 meq | PACK | ORAL | Status: AC
  Administered 2023-02-11: 60 meq via ORAL
  Filled 2023-02-11: qty 3

## 2023-02-11 NOTE — ED Provider Notes (Signed)
[facility] Provider Note    Event Date/Time   First MD Initiated Contact with Patient 02/11/23 0221     (approximate)   History   Chief Complaint: Numbness (Patient presents with numbness around her lips and to the RIGHT side of her face that started approx. 1 hour ago; Patient does have history of TIA's; She took her nightly Seroquel and Xanax before she came in tonight and her speech is slow and slightly slurred (family says that this is how she typically speaks after taking these medications); Spoke with ED physician about patient, no code stroke initiated at this time)   HPI  Amy Hansen is a 67 y.o. female with a history of anxiety, depression, recurrent UTI, diabetes, Crohn's who comes ED complaining of tingling all over her entire face starting at about 10:00 PM tonight.  Waxing and waning.  No motor weakness.  No change in vision or speech.  Also complains of left upper quadrant abdominal pain which has been present for several weeks.  She seen her doctor, had an outpatient CT scan without contrast 1 week ago.  Reports frequent vomiting and poor oral intake.     Physical Exam   Triage Vital Signs: ED Triage Vitals  Encounter Vitals Group     BP 02/10/23 2234 108/76     Systolic BP Percentile --      Diastolic BP Percentile --      Pulse Rate 02/10/23 2234 (!) 140     Resp 02/10/23 2234 18     Temp 02/10/23 2234 98.5 F (36.9 C)     Temp Source 02/10/23 2234 Oral     SpO2 02/10/23 2234 99 %     Weight 02/10/23 2249 108 lb 7.5 oz (49.2 kg)     Height 02/10/23 2249 5' (1.524 m)     Head Circumference --      Peak Flow --      Pain Score --      Pain Loc --      Pain Education --      Exclude from Growth Chart --     Most recent vital signs: Vitals:   02/11/23 0300 02/11/23 0605  BP: 100/71 101/80  Pulse: (!) 101 90  Resp:  18  Temp:  98.3 F (36.8 C)  SpO2: 100% 100%    General: Awake, no distress.  CV:  Good peripheral  perfusion.  Tachycardia heart rate 115, normal distal pulses Resp:  Normal effort.  Clear to auscultation bilaterally Abd:  No distention.  Soft with left upper quadrant tenderness Other:  Cranial nerves II through XII intact.  No motor drift.  Normal cerebellar function.  Normal speech and language.  NIH stroke scale 0   ED Results / Procedures / Treatments   Labs (all labs ordered are listed, but only abnormal results are displayed) Labs Reviewed  CBC WITH DIFFERENTIAL/PLATELET - Abnormal; Notable for the following components:      Result Value   WBC 11.9 (*)    Hemoglobin 11.4 (*)    HCT 35.2 (*)    Neutro Abs 9.5 (*)    All other components within normal limits  COMPREHENSIVE METABOLIC PANEL - Abnormal; Notable for the following components:   Potassium 2.6 (*)    Glucose, Bld 165 (*)    BUN <5 (*)    Calcium 8.2 (*)    Total Protein 6.1 (*)    Albumin 3.0 (*)    Alkaline Phosphatase 158 (*)  All other components within normal limits  URINALYSIS, ROUTINE W REFLEX MICROSCOPIC - Abnormal; Notable for the following components:   Color, Urine STRAW (*)    APPearance CLEAR (*)    Specific Gravity, Urine 1.001 (*)    Glucose, UA 50 (*)    Leukocytes,Ua SMALL (*)    Bacteria, UA FEW (*)    All other components within normal limits  CBG MONITORING, ED - Abnormal; Notable for the following components:   Glucose-Capillary 144 (*)    All other components within normal limits  MAGNESIUM     EKG Interpreted by me Sinus tachycardia rate 140.  Left axis, normal intervals.  Poor R wave progression.  Normal ST segments and T waves.   RADIOLOGY CT head interpreted by me, negative for hemorrhage or intracranial mass.  Radiology report reviewed.  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  No other acute findings.   PROCEDURES:  Procedures   MEDICATIONS ORDERED IN ED: Medications  sodium chloride 0.9 % bolus 1,000 mL (0 mLs Intravenous  Stopped 02/11/23 0344)  potassium chloride (KLOR-CON) packet 60 mEq (60 mEq Oral Given 02/11/23 0315)  ondansetron (ZOFRAN) injection 4 mg (4 mg Intravenous Given 02/11/23 0314)  iohexol (OMNIPAQUE) 300 MG/ML solution 80 mL (80 mLs Intravenous Contrast Given 02/11/23 0345)     IMPRESSION / MDM / ASSESSMENT AND PLAN / ED COURSE  I reviewed the triage vital signs and the nursing notes.  DDx: Colitis, diverticulitis, bowel obstruction, intra-abdominal abscess, pancreatitis, dehydration, AKI, electrolyte abnormality  Patient's presentation is most consistent with acute presentation with potential threat to life or bodily function.  Patient presents with abdominal pain and poor oral intake.  Clinically appears mildly dehydrated.  Will give IV fluids.  Oral potassium replacement due to K of 2.6.  Other labs are unremarkable.  Will need CT abdomen pelvis   Clinical Course as of 02/11/23 0723  Mon Feb 11, 2023  0554 Feeling better, resting comfortably.  Vital stable, tachycardia improving.  Tolerating oral intake.  CT reassuring, does reveal hiatal hernia of Roux-en-Y gastric bypass without signs of obstruction or ischemia.  This corresponds to her symptoms, attributable to GERD resulting from the hernia segment. [PS]    Clinical Course User Index [PS] Sharman Cheek, MD      FINAL CLINICAL IMPRESSION(S) / ED DIAGNOSES   Final diagnoses:  LUQ abdominal pain  Hiatal hernia without gangrene or obstruction     Rx / DC Orders   ED Discharge Orders          Ordered    famotidine (PEPCID) 20 MG tablet  2 times daily        02/11/23 0554             Note:  This document was prepared using Dragon voice recognition software and may include unintentional dictation errors.   Sharman Cheek, MD 02/11/23 [phone]

## 2023-02-11 NOTE — Progress Notes (Signed)
No show

## 2023-02-11 NOTE — Telephone Encounter (Signed)
Patient called stated she is in really bad shape and is wanting to see of Dr. Adela Lank can order a liver bx as he mentioned he wanted to do at some point for her. Please advise.

## 2023-02-11 NOTE — Transitions of Care (Post Inpatient/ED Visit) (Signed)
   02/11/2023  Name: Amy Hansen MRN: [id number] DOB: 05-Apr-1956  Today's TOC FU Call Status: Today's TOC FU Call Status:: Unsuccessul Call (1st Attempt) Unsuccessful Call (1st Attempt) Date: 02/11/23  Attempted to reach the patient regarding the most recent Inpatient/ED visit.  Follow Up Plan: Additional outreach attempts will be made to reach the patient to complete the Transitions of Care (Post Inpatient/ED visit) call.   Gean Maidens BSN RN [facility] [phone]

## 2023-02-12 ENCOUNTER — Telehealth: Payer: Self-pay | Admitting: *Deleted

## 2023-02-12 LAB — CALPROTECTIN, FECAL: Calprotectin, Fecal: 62 ug/g (ref 0–120)

## 2023-02-12 NOTE — Telephone Encounter (Signed)
Okay thank you for clarifying, appreciate you taking the time to do that.  She has had a complicated hospital course recently.  Her white blood cell count was elevated just the other day, I recommend she talk with her primary care about this especially if she is feeling that she may have had some fever.  She should call her PCP for further instructions about that, want to make sure she is not having an infection somewhere.  The liver enzymes are not an urgent issue, they are just barely elevated when she was in the hospital.  I would not recommend doing a liver biopsy right now until her more acute issues are addressed.  Please tell her we will address it but I do not want to do it now until she is feeling better in regards to what is causing some of her other symptoms.  I think it is okay to continue Entyvio if she has tolerated that well that should not have any significant infectious risk.  Can continue antiemetics if needed.  She probably needs repeat labs given her symptoms but she had AKI and pyelonephritis at her last admission, she needs to contact her primary care about her current symptoms I am not convinced her GI tract is driving these problems currently.

## 2023-02-12 NOTE — Telephone Encounter (Signed)
Called and spoke with patient. Pt was seen in the ER yesterday. Pt reports that she feels "sick". I asked patient to clarify what she meant by "sick", pt states that she has had chills for about 4 weeks, no fever, cheeks are flushed, and her body is warm. Patient states that she tried to eat solids yesterday for the first time, she made herself some french fries. About 30 minutes after eating this she had nausea and vomiting. Pt was prescribed Phenergan rectal suppositories that seem to help some, but does not resolve symptoms completely. Pt did not have any further vomiting yesterday. Pt is tolerating liquids with no issue. Patient has stopped several of her medications because they are too large for her to swallow. Patient has only been taking Pepcid 20 mg BID and xanax orally. Prescribed a short course of Ativan after hospital visit. Pt has an appt with her PCP on 7/22 to discuss medication management and to follow up her abdominal pain. Pt was informed by ER that she could possibly be having withdrawal symptoms due to stopping medications abruptly. Pt is concerned with ongoing symptoms and no solution. Pt does still want to proceed with liver biopsy, she understands that this will not fix her symptoms but may give [name] an explanation for her elevated liver enzymes. Patient knows to expect a call from radiology scheduling to set up liver biopsy. Pt is also scheduled for Entyvio infusion on 02/14/23, which she typically tolerates well and wishes to proceed with. Please advise, thanks.   Liver biopsy order in epic. Secure staff message sent to radiology scheduling to contact patient to set up appt.

## 2023-02-12 NOTE — Telephone Encounter (Signed)
Brookyln can you help clarify what symptoms she is having and what is bothering her so I can clarify next steps.   Liver biopsy won't make her feel any better but would be only considered to help clarify what is driving her liver enzyme elevation, but that may not be related to why she is feeling poorly.

## 2023-02-12 NOTE — Addendum Note (Signed)
Addended by: Missy Sabins on: 02/12/2023 02:40 PM   Modules accepted: Orders

## 2023-02-13 ENCOUNTER — Ambulatory Visit: Payer: Self-pay | Admitting: *Deleted

## 2023-02-13 ENCOUNTER — Encounter: Payer: Self-pay | Admitting: Nurse Practitioner

## 2023-02-13 MED ORDER — AMPICILLIN 500 MG PO CAPS: 500.0000 mg | ORAL_CAPSULE | Freq: Four times a day (QID) | ORAL | 0 refills | Status: AC

## 2023-02-13 MED ORDER — AMPICILLIN 500 MG PO CAPS: 500.0000 mg | ORAL_CAPSULE | Freq: Four times a day (QID) | ORAL | 0 refills | Status: DC

## 2023-02-13 NOTE — Telephone Encounter (Signed)
Returned call to patient. We reviewed recommendations as outlined below. Pt is aware that we have cancelled liver biopsy until her acute issues are addressed. Pt knows to proceed with [facility] as scheduled. Pt mentioned that she has an appt with her PCP and urologist next week. I advised pt to contact her PCP today to see if they can see her this week due to her ongoing symptoms. Pt states that she was woken up out of her sleep with lower back pain, I told her that this was likely related to her kidney infection. I assured patient that her current symptoms are not GI related and she really needs to contact PCP especially for repeat labs and possible repeat UA. Pt stated that she will call PCP's office today. Pt verbalized understanding and had no concerns at the end of the call.

## 2023-02-13 NOTE — Telephone Encounter (Signed)
  Chief Complaint: UTI Symptoms: 02/11/23 in ED. UTI Frequency: 02/11/23 Pertinent Negatives: Patient denies  Disposition: [] ED /[] Urgent Care (no appt availability in office) / [] Appointment(In office/virtual)/ []  [place] [facility]/ [] Home Care/ [] Refused Recommended Disposition /[] [place] [facility]/ [x]  Follow-up with PCP Additional Notes: Pt states in ED 02/11/23, UTI symptoms. Lab results available in EPIC. States was not given ATB as Cx not resulted. Reports same symptoms; dysuria, urgency, frequency. Has lower back pain "Makes me yell out at times." Radiates to flank areas. Expresses frustration cannot get help with this issue. States she would like Jolene to look at this "As I think she will be proactive." Appt was available today at 4, pt had other appt scheduled at that time.Pt states she should not have to come in as info is provided from ED. CAre advise provided, pt verbalizes understanding. Please advise.  Reason for Disposition  Side (flank) or lower back pain present  Answer Assessment - Initial Assessment Questions 1. SYMPTOM: "What's the main symptom you're concerned about?" (e.g., frequency, incontinence)     ED, has UTII 2. ONSET: "When did the    start?"     ED 02/11/23 3. PAIN: "Is there any pain?" If Yes, ask: "How bad is it?" (Scale: 1-10; mild, moderate, severe)     yes 4. CAUSE: "What do you think is causing the symptoms?"     UTI 5. OTHER SYMPTOMS: "Do you have any other symptoms?" (e.g., blood in urine, fever, flank pain, pain with urination)     Lower back pain, radiates to flank. "I yell out at times with it." Frequency and urgency, bladder pressure, vaginal itching.  Protocols used: Urinary Symptoms-A-AH

## 2023-02-13 NOTE — Transitions of Care (Post Inpatient/ED Visit) (Signed)
02/13/2023  Name: Amy Hansen MRN: [id number] DOB: 03-24-1956  Today's TOC FU Call Status: Today's TOC FU Call Status:: Successful TOC FU Call Competed TOC FU Call Complete Date: 02/13/23  Transition Care Management Follow-up Telephone Call Date of Discharge: 02/08/23 Discharge Facility: [facility]) Type of Discharge: Inpatient Admission Primary Inpatient Discharge Diagnosis:: hiatal hernia How have you been since you were released from the hospital?: Worse Any questions or concerns?: Yes Patient Questions/Concerns:: Patient stated that gastro had called and told her she has UTI and need to call PCP for ATBXX. Patient has called and left message for PCP. RN made Patient aware that it is documented that they received the call. Patient Questions/Concerns Addressed: Other:  Items Reviewed: Did you receive and understand the discharge instructions provided?: Yes Medications obtained,verified, and reconciled?: Yes (Medications Reviewed) Any new allergies since your discharge?: No Dietary orders reviewed?: No Do you have support at home?: Yes People in Home: spouse Name of Support/Comfort Primary Source: Benita Gutter  Medications Reviewed Today: Medications Reviewed Today     Reviewed by Luella Cook, RN (Case Manager) on 02/13/23 at 1119  Med List Status: <None>   Medication Order Taking? Sig Documenting Provider Last Dose Status Informant  aspirin EC 81 MG tablet [id number] Yes Take 1 tablet (81 mg) by mouth once daily. Swallow whole. [provider] Taking Active Self, Pharmacy Records  famotidine (PEPCID) 20 MG tablet [id number] Yes Take 1 tablet (20 mg total) by mouth 2 (two) times daily. Sharman Cheek, MD Taking Active   furosemide (LASIX) 20 MG tablet [id number] Yes Take 1 tablet (20 mg total) by mouth daily. May take an additional tablet (20mg ) AS NEEDED for weight gain and/or swelling. Sondra Barges, PA-C Taking Active Self, Pharmacy Records  LORazepam  (ATIVAN) 0.5 MG tablet [id number] Yes take 2 tablets by mouth in the morning and 3 tablets by mouth at night Marlin Canary U, DO Taking Active   metoprolol tartrate (LOPRESSOR) 25 MG tablet [id number] Yes Take 1 tablet (25 mg total) by mouth 2 (two) times daily. Joseph Art, DO Taking Active   ondansetron (ZOFRAN-ODT) 8 MG disintegrating tablet [id number] Yes Take 1 tablet (8 mg total) by mouth every 8 (eight) hours as needed for nausea or vomiting. [name], NP Taking Active Self, Pharmacy Records  polyethylene glycol (MIRALAX / GLYCOLAX) 17 g packet [id number] Yes Take 17 g by mouth 2 (two) times daily. Joseph Art, DO Taking Active   pramipexole (MIRAPEX) 1 MG tablet [id number] Yes Take 1 mg by mouth at bedtime. [provider] Taking Active Self, Pharmacy Records  promethazine (PHENERGAN) 25 MG tablet [id number] Yes Take 1 tablet (25 mg total) by mouth every 8 (eight) hours as needed for nausea or vomiting. Benancio Deeds, MD Taking Active Self, Pharmacy Records  QUEtiapine (SEROQUEL) 100 MG tablet [id number] Yes Take 1 tablet (100 mg total) by mouth at bedtime. Joseph Art, DO Taking Active   rosuvastatin (CRESTOR) 5 MG tablet [id number] Yes Take 1 tablet by mouth once daily [name], NP Taking Active Self, Pharmacy Records  senna-docusate (SENOKOT-S) 8.6-50 MG tablet [id number] Yes Take 1 tablet by mouth daily. Rexford Maus, DO Taking Active Self, Pharmacy Records  vedolizumab (ENTYVIO) 300 MG injection [id number] Yes Inject into the vein. Every 4 weeks [provider] Taking Active Self, Pharmacy Records            Home Care and Equipment/Supplies: Were Home Health Services Ordered?: NA Any new equipment  or medical supplies ordered?: NA  Functional Questionnaire: Do you need assistance with bathing/showering or dressing?: Yes Do you need assistance with meal preparation?: Yes Do you need assistance with eating?: No Do you have  difficulty maintaining continence: No Do you need assistance with getting out of bed/getting out of a chair/moving?: No Do you have difficulty managing or taking your medications?: No  Follow up appointments reviewed: PCP Follow-up appointment confirmed?: Yes Date of PCP follow-up appointment?: 02/18/23 Follow-up Provider: [facility] Follow-up appointment confirmed?: Yes Date of Specialist follow-up appointment?: 02/18/23 Follow-Up Specialty Provider:: Dr McDiarmid Do you need transportation to your follow-up appointment?: No Do you understand care options if your condition(s) worsen?: Yes-patient verbalized understanding  SDOH Interventions Today    Flowsheet Row Most Recent Value  SDOH Interventions   Food Insecurity Interventions Intervention Not Indicated  Housing Interventions Intervention Not Indicated  Transportation Interventions Intervention Not Indicated, Patient Resources (Friends/Family)     .hni TOC Interventions Today    Flowsheet Row Most Recent Value  TOC Interventions   TOC Interventions Discussed/Reviewed TOC Interventions Discussed, TOC Interventions Reviewed        Gean Maidens BSN RN [facility] [phone]

## 2023-02-13 NOTE — Telephone Encounter (Signed)
 Noted  

## 2023-02-14 ENCOUNTER — Encounter: Payer: Self-pay | Admitting: Physician Assistant

## 2023-02-14 ENCOUNTER — Ambulatory Visit (INDEPENDENT_AMBULATORY_CARE_PROVIDER_SITE_OTHER): Payer: BC Managed Care – PPO

## 2023-02-14 ENCOUNTER — Ambulatory Visit (INDEPENDENT_AMBULATORY_CARE_PROVIDER_SITE_OTHER): Payer: Medicare Other | Admitting: Physician Assistant

## 2023-02-14 ENCOUNTER — Encounter: Payer: Self-pay | Admitting: Nurse Practitioner

## 2023-02-14 VITALS — BP 134/81 | HR 88 | Temp 98.1°F | Resp 18 | Ht 60.0 in | Wt 107.6 lb

## 2023-02-14 VITALS — Wt 106.0 lb

## 2023-02-14 DIAGNOSIS — F1320 Sedative, hypnotic or anxiolytic dependence, uncomplicated: Secondary | ICD-10-CM | POA: Diagnosis not present

## 2023-02-14 DIAGNOSIS — G2581 Restless legs syndrome: Secondary | ICD-10-CM

## 2023-02-14 DIAGNOSIS — F411 Generalized anxiety disorder: Secondary | ICD-10-CM | POA: Diagnosis not present

## 2023-02-14 DIAGNOSIS — R296 Repeated falls: Secondary | ICD-10-CM

## 2023-02-14 DIAGNOSIS — Z09 Encounter for follow-up examination after completed treatment for conditions other than malignant neoplasm: Secondary | ICD-10-CM

## 2023-02-14 DIAGNOSIS — K5100 Ulcerative (chronic) pancolitis without complications: Secondary | ICD-10-CM

## 2023-02-14 DIAGNOSIS — G4700 Insomnia, unspecified: Secondary | ICD-10-CM

## 2023-02-14 DIAGNOSIS — R413 Other amnesia: Secondary | ICD-10-CM

## 2023-02-14 MED ORDER — VEDOLIZUMAB 300 MG IV SOLR
300.0000 mg | Freq: Once | INTRAVENOUS | Status: AC
  Administered 2023-02-14: 300 mg via INTRAVENOUS
  Filled 2023-02-14: qty 5

## 2023-02-14 MED ORDER — ALPRAZOLAM 0.5 MG PO TABS: 0.5000 mg | ORAL_TABLET | Freq: Two times a day (BID) | ORAL | 0 refills | Status: DC | PRN

## 2023-02-14 MED ORDER — SERTRALINE HCL 25 MG PO TABS: 25.0000 mg | ORAL_TABLET | Freq: Every day | ORAL | 0 refills | Status: DC

## 2023-02-14 NOTE — Progress Notes (Signed)
Diagnosis: Pancolitis  Provider:  Chilton Greathouse MD  Procedure: IV Infusion  IV Type: Peripheral, IV Location: L Antecubital  Entyvio (Vedolizumab), Dose: 300 mg  Infusion Start Time: 1443  Infusion Stop Time: 1518  Post Infusion IV Care: Peripheral IV Discontinued  Discharge: Condition: Good, Destination: Home . AVS Provided  Performed by:  Adriana Mccallum, RN

## 2023-02-14 NOTE — Progress Notes (Signed)
[facility] Med Check  Patient ID: Amy Hansen,  MRN: [id number]  PCP: [name], NP  Date of Evaluation: 02/14/2023  Time spent:45 minutes  Chief Complaint:  Chief Complaint   Follow-up    HISTORY/CURRENT STATUS: Hosp f/u.  Her husband accompanies her.  Myldred was admitted to the hospital 02/04/2023 due to nausea and vomiting, pyelonephritis.  She was inpatient for 4 days.  States she had been symptomatic for 2 to 3 weeks, had difficulty keeping any of her medications down.  She was having chills and felt horrible all over.  She is not sure if it was due to the illness or lack of having her mental health medications for 3 weeks or so.  It is difficult to say how she is doing mentally right now.  She was changed to Ativan on hospital discharge, was given enough for 3 days.  She had Xanax left over from our previous visit so she restarted that.  We have been very gradually weaning her off of the Xanax over the past 5 months or so.  On discharge, she was told to stop Xanax, Wellbutrin, ropinirole, Lamictal, and Zoloft.  She was to have continued Seroquel but she only took it 1 night, she felt like she slept better without it.  She was on a higher dose prior to the hospitalization.  She slept better without the Seroquel so she stopped it.  She was also told to continue pramipexole, which she is taking.  She brought in a bag of medications but I asked repeatedly what she was taking at this time.  She kept going back to what she was on before the hospitalization or even months ago.  After redirecting her several times the information above was attained.  Her husband also tried to help clarify.  Patient states she will not be able to go back to work and will be seeking disability.  Her most recent job was at [employer].  The anxiety is too much for her but she also reports physical issues with increased falls which we have known about for months.  She broke her arm in May after she  tripped over a vacuum cleaner and fell.  She also has a history of stroke.  It is hard to say how her mental health is right now, she has not been out of the hospital but for about a week.  She is sad and tearful when talking about what has happened to her.  Her energy and motivation are very low.  Personal hygiene is normal.  She has help around the house with her family so ADLs are being taken care of but no housework right now.  Her appetite is still low.  States she has lost a lot of weight from the nausea and vomiting that happened before the hospitalization.  She is able to eat and drink a little more, still with clear liquids for the most part though.  This has been bad enough that she carries a "vomit bag" with her all the time.  Her memory is still bad, but no worse.  She has a hard time staying focused.  No suicidal or homicidal thoughts.  She is still very anxious.  She does not think the Ativan work as well as the Xanax.  She felt like it was stronger actually.  Her startle reflex is increased.  Her husband corroborates.  She will jump if someone shuts the door for example.  She is not having panic attacks  to the point of having shortness of breath or chest pain.  No withdrawals from the Xanax as she took her last 1 yesterday.  Patient denies increased energy with decreased need for sleep, increased talkativeness, racing thoughts, impulsivity or risky behaviors, increased spending, increased libido, grandiosity, increased irritability or anger, paranoia, or hallucinations.  Review of Systems  Constitutional:  Positive for malaise/fatigue and weight loss.  HENT: Negative.    Eyes: Negative.   Respiratory: Negative.    Cardiovascular: Negative.   Gastrointestinal:  Positive for nausea and vomiting.  Genitourinary: Negative.   Musculoskeletal:  Positive for falls.  Skin: Negative.   Neurological:  Positive for weakness.       See HPI  Endo/Heme/Allergies: Negative.    Psychiatric/Behavioral:         See HPI   Individual Medical History/ Review of Systems: Changes? :Yes    see HPI and hosp records.   Past medications for mental health diagnoses include: Trazodone, Risperdal, Zoloft, Lunesta, prazosin, Sonata, Prozac, Depakote, Lamictal, lithium, Wellbutrin, Xanax, Ambien, carbamazepine, Seroquel, Buspar caused falls and hallucinations, Gabapentin-she doesn't want to take  Allergies: Avelox [moxifloxacin hcl in nacl], Bactrim [sulfamethoxazole-trimethoprim], Ciprofloxacin, Buspar [buspirone], Neomycin-polymyxin-dexameth, Aquaphor [lanolin-petrolatum], Depakote [divalproex sodium], Imitrex [sumatriptan], and Stadol [butorphanol]  Current Medications:  Current Outpatient Medications:    ALPRAZolam (XANAX) 0.5 MG tablet, Take 1 tablet (0.5 mg total) by mouth 2 (two) times daily as needed for anxiety., Disp: 30 tablet, Rfl: 0   ampicillin (PRINCIPEN) 500 MG capsule, Take 1 capsule (500 mg total) by mouth 4 (four) times daily for 7 days., Disp: 28 capsule, Rfl: 0   famotidine (PEPCID) 20 MG tablet, Take 1 tablet (20 mg total) by mouth 2 (two) times daily., Disp: 60 tablet, Rfl: 0   polyethylene glycol (MIRALAX / GLYCOLAX) 17 g packet, Take 17 g by mouth 2 (two) times daily., Disp: , Rfl:    pramipexole (MIRAPEX) 1 MG tablet, Take 1 mg by mouth at bedtime., Disp: , Rfl:    promethazine (PHENERGAN) 25 MG tablet, Take 1 tablet (25 mg total) by mouth every 8 (eight) hours as needed for nausea or vomiting., Disp: 20 tablet, Rfl: 0   sertraline (ZOLOFT) 25 MG tablet, Take 1 tablet (25 mg total) by mouth daily., Disp: 30 tablet, Rfl: 0   vedolizumab (ENTYVIO) 300 MG injection, Inject into the vein. Every 4 weeks, Disp: , Rfl:    aspirin EC 81 MG tablet, Take 1 tablet (81 mg) by mouth once daily. Swallow whole. (Patient not taking: Reported on 02/14/2023), Disp: , Rfl:    furosemide (LASIX) 20 MG tablet, Take 1 tablet (20 mg total) by mouth daily. May take an additional  tablet (20mg ) AS NEEDED for weight gain and/or swelling. (Patient not taking: Reported on 02/14/2023), Disp: 90 tablet, Rfl: 3   metoprolol tartrate (LOPRESSOR) 25 MG tablet, Take 1 tablet (25 mg total) by mouth 2 (two) times daily. (Patient not taking: Reported on 02/14/2023), Disp: 60 tablet, Rfl: 0   ondansetron (ZOFRAN-ODT) 8 MG disintegrating tablet, Take 1 tablet (8 mg total) by mouth every 8 (eight) hours as needed for nausea or vomiting. (Patient not taking: Reported on 02/14/2023), Disp: 60 tablet, Rfl: 1   QUEtiapine (SEROQUEL) 100 MG tablet, Take 1 tablet (100 mg total) by mouth at bedtime. (Patient not taking: Reported on 02/14/2023), Disp: 30 tablet, Rfl: 0   rosuvastatin (CRESTOR) 5 MG tablet, Take 1 tablet by mouth once daily (Patient not taking: Reported on 02/14/2023), Disp: 90 tablet, Rfl: 1  senna-docusate (SENOKOT-S) 8.6-50 MG tablet, Take 1 tablet by mouth daily. (Patient not taking: Reported on 02/14/2023), Disp: 30 tablet, Rfl: 0 No current facility-administered medications for this visit.  Facility-Administered Medications Ordered in Other Visits:    vedolizumab (ENTYVIO) 300 mg in sodium chloride 0.9 % 250 mL infusion, 300 mg, Intravenous, Once, Mannam, Praveen, MD Medication Side Effects: none  Family Medical/ Social History: Changes?  no  MENTAL HEALTH EXAM:  Weight 106 lb (48.1 kg).Body mass index is 20.7 kg/m.  General Appearance: Casual and Well Groomed  Eye Contact:  Good  Speech:  Clear and Coherent and Normal Rate  Volume:  Normal  Mood:   sad  Affect:   Tearful but easily consolable  Thought Process:  Goal Directed and Descriptions of Associations: Tangential  Orientation:  Full (Time, Place, and Person)  Thought Content: Logical and Tangential   Suicidal Thoughts:  No  Homicidal Thoughts:  No  Memory:   forgetful but seems stable  Judgement:  Good  Insight:  Good  Psychomotor Activity:  Normal  Concentration:  Concentration: Fair and Attention Span: Good   Recall:  Good  Fund of Knowledge: Good  Language: Good  Assets:  Communication Skills Desire for Improvement Financial Resources/Insurance Housing Transportation   ADL's:  Intact  Cognition: WNL  Prognosis:  Good   Reviewed hospital notes including labs and imaging studies  DIAGNOSES:    ICD-10-CM   1. Generalized anxiety disorder  F41.1     2. Hospital discharge follow-up  Z09     3. Benzodiazepine dependence (HCC)  F13.20     4. Restless legs syndrome (RLS)  G25.81     5. Memory changes  R41.3     6. Insomnia, unspecified type  G47.00     7. Frequent falls  R29.6      Receiving Psychotherapy: No   RECOMMENDATIONS:  PDMP reviewed.  Ativan filled 02/08/2023. Oxycodone filled since LOV. I provided 45 minutes of face to face time during this encounter, including time spent before and after the visit in records review, medical decision making, counseling pertinent to today's visit, and charting.   We had a long discussion concerning her medications and the fact that she has been off of Wellbutrin and Zoloft for approximately 3 to 4 weeks now.  The Seroquel was given inpatient but she has only taken it once as an outpatient.  There are no concerns of mania at this time.  I do not want to add medications back that may not be needed or could worsen other symptoms such as Wellbutrin may worsen the anxiety.  I recommend starting back on a low dose of Zoloft which will help both anxiety and depression although we may need to increase the dose.  Again no rapid changes since she is still having some nausea and vomiting although much better.  We will continue the Xanax wean as we had been prior to her hospitalization.  Our goal is still to get her off completely in a safe manner, due to frequent falls.  She and her husband understand and agree with this plan.  We may have to restart Lamictal or Seroquel depending on her symptoms of bipolar disorder.  However I do not think it is necessary  to focus on that at this time.  Decrease Xanax 0.5 mg, to 1 (0.5 mg) po q am, and 1 (0.5 mg) p.o. nightly. Continue pramipexole 1 mg, 1 p.o. nightly. Hold Seroquel for now. Restart Zoloft 25 mg, 1 p.o.  daily. Recommend counseling. Return in 2 weeks.   Melony Overly, PA-C

## 2023-02-15 ENCOUNTER — Telehealth: Payer: Self-pay | Admitting: Nurse Practitioner

## 2023-02-15 ENCOUNTER — Other Ambulatory Visit: Payer: Self-pay | Admitting: Nurse Practitioner

## 2023-02-15 MED ORDER — PRAMIPEXOLE DIHYDROCHLORIDE 1 MG PO TABS: 1.0000 mg | ORAL_TABLET | Freq: Every day | ORAL | 1 refills | Status: DC

## 2023-02-15 NOTE — Telephone Encounter (Signed)
Rx sent to [facility] today.

## 2023-02-15 NOTE — Telephone Encounter (Signed)
Requested medication (s) are due for refill today: -  Requested medication (s) are on the active medication list: historical med   Last refill:  11/19/22  Future visit scheduled:yes  Notes to clinic:  historical provider   Requested Prescriptions  Pending Prescriptions Disp Refills   pramipexole (MIRAPEX) 1 MG tablet [Pharmacy Med Name: Pramipexole Dihydrochloride 1 MG Oral Tablet] 90 tablet 0    Sig: TAKE 1 TABLET BY MOUTH AT BEDTIME     Neurology:  Parkinsonian Agents Passed - 02/15/2023 11:10 AM      Passed - Last BP in normal range    BP Readings from Last 1 Encounters:  02/14/23 134/81         Passed - Last Heart Rate in normal range    Pulse Readings from Last 1 Encounters:  02/14/23 88         Passed - Valid encounter within last 12 months    Recent Outpatient Visits           1 week ago Poorly controlled type 2 diabetes mellitus (HCC)   [facility] [facility], Quanah T, NP   1 month ago Closed supracondylar fracture of left humerus with routine healing, subsequent encounter   [place] [facility] [name], NP   3 months ago Benzodiazepine withdrawal without complication [facility])   [facility] [name], NP   4 months ago Benzodiazepine withdrawal without complication [facility])   [facility] [name], NP   4 months ago Recurrent UTI   [place] [facility] [name], NP       Future Appointments             In 3 days MacDiarmid, Lorin Picket, MD [facility] [place]   In 3 days [place], Dorie Rank, NP [place] [facility]

## 2023-02-15 NOTE — Telephone Encounter (Signed)
Copied from CRM [id number]. Topic: General - Other >> Feb 15, 2023  4:33 PM Everette C wrote: Reason for CRM: Medication Refill - Medication: pramipexole (MIRAPEX) 1 MG tablet [[id number]]  Has the patient contacted their pharmacy? Yes.   (Agent: If no, request that the patient contact the pharmacy for the refill. If patient does not wish to contact the pharmacy document the reason why and proceed with request.) (Agent: If yes, when and what did the pharmacy advise?)  Preferred Pharmacy (with phone number or street name): [facility] - [place], [place] - [address] [place] [address] Phone: [phone] Fax: [phone] Hours: Not open 24 hours   Has the patient been seen for an appointment in the last year OR does the patient have an upcoming appointment? Yes.    Agent: Please be advised that RX refills may take up to 3 business days. We ask that you follow-up with your pharmacy.

## 2023-02-17 NOTE — Patient Instructions (Signed)
Urinary Tract Infection, Adult A urinary tract infection (UTI) is an infection of any part of the urinary tract. The urinary tract includes: The kidneys. The ureters. The bladder. The urethra. These organs make, store, and get rid of pee (urine) in the body. What are the causes? This infection is caused by germs (bacteria) in your genital area. These germs grow and cause swelling (inflammation) of your urinary tract. What increases the risk? The following factors may make you more likely to develop this condition: Using a small, thin tube (catheter) to drain pee. Not being able to control when you pee or poop (incontinence). Being female. If you are female, these things can increase the risk: Using these methods to prevent pregnancy: A medicine that kills sperm (spermicide). A device that blocks sperm (diaphragm). Having low levels of a female hormone (estrogen). Being pregnant. You are more likely to develop this condition if: You have genes that add to your risk. You are sexually active. You take antibiotic medicines. You have trouble peeing because of: A prostate that is bigger than normal, if you are female. A blockage in the part of your body that drains pee from the bladder. A kidney stone. A nerve condition that affects your bladder. Not getting enough to drink. Not peeing often enough. You have other conditions, such as: Diabetes. A weak disease-fighting system (immune system). Sickle cell disease. Gout. Injury of the spine. What are the signs or symptoms? Symptoms of this condition include: Needing to pee right away. Peeing small amounts often. Pain or burning when peeing. Blood in the pee. Pee that smells bad or not like normal. Trouble peeing. Pee that is cloudy. Fluid coming from the vagina, if you are female. Pain in the belly or lower back. Other symptoms include: Vomiting. Not feeling hungry. Feeling mixed up (confused). This may be the first symptom in  older adults. Being tired and grouchy (irritable). A fever. Watery poop (diarrhea). How is this treated? Taking antibiotic medicine. Taking other medicines. Drinking enough water. In some cases, you may need to see a specialist. Follow these instructions at home:  Medicines Take over-the-counter and prescription medicines only as told by your doctor. If you were prescribed an antibiotic medicine, take it as told by your doctor. Do not stop taking it even if you start to feel better. General instructions Make sure you: Pee until your bladder is empty. Do not hold pee for a long time. Empty your bladder after sex. Wipe from front to back after peeing or pooping if you are a female. Use each tissue one time when you wipe. Drink enough fluid to keep your pee pale yellow. Keep all follow-up visits. Contact a doctor if: You do not get better after 1-2 days. Your symptoms go away and then come back. Get help right away if: You have very bad back pain. You have very bad pain in your lower belly. You have a fever. You have chills. You feeling like you will vomit or you vomit. Summary A urinary tract infection (UTI) is an infection of any part of the urinary tract. This condition is caused by germs in your genital area. There are many risk factors for a UTI. Treatment includes antibiotic medicines. Drink enough fluid to keep your pee pale yellow. This information is not intended to replace advice given to you by your health care provider. Make sure you discuss any questions you have with your health care provider. Document Revised: 02/21/2020 Document Reviewed: 02/26/2020 Elsevier Patient Education    2024 Elsevier Inc.  

## 2023-02-18 ENCOUNTER — Encounter: Payer: Self-pay | Admitting: Nurse Practitioner

## 2023-02-18 ENCOUNTER — Ambulatory Visit (INDEPENDENT_AMBULATORY_CARE_PROVIDER_SITE_OTHER): Payer: BC Managed Care – PPO | Admitting: Nurse Practitioner

## 2023-02-18 ENCOUNTER — Ambulatory Visit (INDEPENDENT_AMBULATORY_CARE_PROVIDER_SITE_OTHER): Payer: BC Managed Care – PPO | Admitting: Urology

## 2023-02-18 VITALS — BP 130/84 | HR 92 | Ht 60.0 in | Wt 111.0 lb

## 2023-02-18 VITALS — BP 116/76 | HR 80

## 2023-02-18 DIAGNOSIS — N390 Urinary tract infection, site not specified: Secondary | ICD-10-CM

## 2023-02-18 DIAGNOSIS — N179 Acute kidney failure, unspecified: Secondary | ICD-10-CM | POA: Diagnosis not present

## 2023-02-18 DIAGNOSIS — K5100 Ulcerative (chronic) pancolitis without complications: Secondary | ICD-10-CM

## 2023-02-18 DIAGNOSIS — E119 Type 2 diabetes mellitus without complications: Secondary | ICD-10-CM | POA: Diagnosis not present

## 2023-02-18 DIAGNOSIS — N952 Postmenopausal atrophic vaginitis: Secondary | ICD-10-CM | POA: Diagnosis not present

## 2023-02-18 DIAGNOSIS — E876 Hypokalemia: Secondary | ICD-10-CM | POA: Diagnosis not present

## 2023-02-18 DIAGNOSIS — R310 Gross hematuria: Secondary | ICD-10-CM | POA: Diagnosis not present

## 2023-02-18 DIAGNOSIS — F329 Major depressive disorder, single episode, unspecified: Secondary | ICD-10-CM

## 2023-02-18 DIAGNOSIS — N958 Other specified menopausal and perimenopausal disorders: Secondary | ICD-10-CM | POA: Diagnosis not present

## 2023-02-18 DIAGNOSIS — N12 Tubulo-interstitial nephritis, not specified as acute or chronic: Secondary | ICD-10-CM

## 2023-02-18 DIAGNOSIS — Z7984 Long term (current) use of oral hypoglycemic drugs: Secondary | ICD-10-CM

## 2023-02-18 MED ORDER — SITAGLIPTIN PHOSPHATE 100 MG PO TABS: 100.0000 mg | ORAL_TABLET | Freq: Every day | ORAL | 4 refills | Status: DC

## 2023-02-18 MED ORDER — FLUCONAZOLE 100 MG PO TABS: ORAL_TABLET | ORAL | 0 refills | Status: DC

## 2023-02-18 NOTE — Assessment & Plan Note (Signed)
>>  ASSESSMENT AND PLAN FOR DIABETES MELLITUS TYPE 2 IN NONOBESE (HCC) WRITTEN ON 02/18/2023  5:15 PM BY CANNADY, JOLENE T, NP  Chronic, stable with A1c at 6.5% one week ago.  She stopped Mounjaro due to further weight loss and increased nausea.  Can not take Metformin due to GI effects.  Would avoid SGLT2 at this time due to her recurrent UTI and recent pyelonephritis.  At this time: - Trial Januvia 100 MG daily, this may benefit lowering sugars and hae less side effects.  Plan for recheck of A1c in 3 months and if trend up consider low dose Glipizide, although prefer to avoid this and insulin if possible. - Recommend heavy focus on diet and regular activity daily. - Check BS 2-3 times daily and document for provider visits.   - No ACE or ARB on board, could consider in future, will hold off at this time due to recent illness.  Maintain Statin. - Eye and foot exams up to date.

## 2023-02-18 NOTE — Assessment & Plan Note (Signed)
Acute and improving.  Continue collaboration with urology, recent note and plan reviewed.  They obtained urine sample today.  In office will check CBC and CMP.

## 2023-02-18 NOTE — Assessment & Plan Note (Signed)
Acute and improving, secondary to pyelonephritis.  Recheck labs today.

## 2023-02-18 NOTE — Assessment & Plan Note (Signed)
Chronic, stable with A1c at 6.5% one week ago.  She stopped Mounjaro due to further weight loss and increased nausea.  Can not take Metformin due to GI effects.  Would avoid SGLT2 at this time due to her recurrent UTI and recent pyelonephritis.  At this time: - Trial Januvia 100 MG daily, this may benefit lowering sugars and hae less side effects.  Plan for recheck of A1c in 3 months and if trend up consider low dose Glipizide, although prefer to avoid this and insulin if possible. - Recommend heavy focus on diet and regular activity daily. - Check BS 2-3 times daily and document for provider visits.   - No ACE or ARB on board, could consider in future, will hold off at this time due to recent illness.  Maintain Statin. - Eye and foot exams up to date.

## 2023-02-18 NOTE — Patient Instructions (Addendum)
Please call Infectious Disease for an appointment [phone]  [facility] taking ampicillin then start back on Keflex daily

## 2023-02-18 NOTE — Progress Notes (Signed)
BP 130/84   Pulse 92   Ht 5' (1.524 m)   Wt 111 lb (50.3 kg)   LMP  (LMP Unknown)   SpO2 100%   BMI 21.68 kg/m    Subjective:    Patient ID: Amy Hansen, female    DOB: 10/15/1955, 67 y.o.   MRN: [id number]  HPI: Amy Hansen is a 67 y.o. female  Chief Complaint  Patient presents with   Abdominal Pain    Here for follow up, Patient states that the abd pain has gotten better.    [facility] Follow up.  Admitted to hospital on 02/04/23 for pyelonephritis.  Was seen by urology this morning and urine assessed.  Was given Diflucan for yeast.  The plan is to go back on vaginal Estrace 2-3 times a week and daily Keflex.  She will follow-up with her infectious disease provider in [place].  Abdominal pain has improved at this time.  Endorses some dysuria at present due to current yeast.  Continues to have chills and mild nausea.  No emesis.  In hospital they stopped Requip and continue Pramipexole.    Her sugars have been elevated coming off Mounjaro -- the injectables caused GI upset and issues.  Metformin caused GI issues.  Sugars at home have fasting 119 to 2 hours after eating 190 to 271.  A1c recently was 6.5%.   "Amy Hansen is a 67 y.o. female with medical history significant of chronic diastolic CHF, colitis, DM, and TIA presenting with flank pain.  Patient has a pan-positive ROS and reports anxiety most of all.  +headache, + chills, vision changes, "frequent TIAs" with memory loss, tinnitus, dysphagia, weight loss, palpitations, SOB, cough, abdominal pain (not present on exam while distracted), intractable n/v, urinary frequency, rash (not present on exam), bilateral flank pain (not present on exam while distracted), numbness, severe anxiety.  She went to be seen at urology recently and was treated with doxy x 10 days.  She also went to urgent care but they turned her away when they learned that GI told her to go to the ER.      Hospital Course by Problem:     AKI -Patient without baseline compromised renal function  -Current creatinine is increased at least 1.5 times compared to baseline and presumed to have occurred within the last 7 days -Likely due to prerenal secondary to severe dehydration in the setting of dysphagia and n/v  -resolved -no sign of infection so will not give any further abx   Melena -GI consulted -appears resolved   Anxiety -She had a diffusely positive ROS and obvious frank anxiety  -She is on significant psychotropic medications at baseline including alprazolam, bupropion, lamotrigine, Quetiapine, sertraline -Psychiatry consult requested:  start seroquel 100 at bedtime,  change to PO ativan   Dysphagia, N/V, Colitis -She has had significant colitis but this appears to be fairly well controlled on Entyvio -She has a large amount of stool on CT and so likely needs a bowel regimen, which might help her n/v - Senekot S (home med) + Miralax BID -GI consulted   HTN -added BB for now -adjust as needed   DM -She has had significant weight loss and is quite thin -She was taking Mounjaro but has stopped this (appropriately) -Nutrition consult ordered -SSI while in the hospital   Chronic diastolic CHF -Appears compenstated   HLD -Continue rosuvastatin   Hypokalemia -repleted again"  Hospital/Facility: [facility] D/C Physician: Dr. Benjamine Mola  D/C Date: 02/08/23  Records Requested: 02/18/23 Records Received: 02/18/23 Records Reviewed: 02/18/23  Diagnoses on Discharge: AKI (acute kidney injury)   Date of interactive Contact within 48 hours of discharge:  Contact was through: phone  Date of 7 day or 14 day face-to-face visit:    within 14 days  Outpatient Encounter Medications as of 02/18/2023  Medication Sig   ALPRAZolam (XANAX) 0.5 MG tablet Take 1 tablet (0.5 mg total) by mouth 2 (two) times daily as needed for anxiety.   ampicillin (PRINCIPEN) 500 MG capsule Take 1 capsule (500 mg total) by mouth 4 (four)  times daily for 7 days.   aspirin EC 81 MG tablet Take 1 tablet (81 mg) by mouth once daily. Swallow whole.   cephALEXin (KEFLEX) 250 MG capsule Take 250 mg by mouth daily.   estradiol (ESTRACE) 0.1 MG/GM vaginal cream Estrogen Cream Instruction  Discard applicator  Apply pea sized amount to tip of finger to urethra before bed. Wash hands well after application. Use Monday, Wednesday and Friday   famotidine (PEPCID) 20 MG tablet Take 1 tablet (20 mg total) by mouth 2 (two) times daily.   fluconazole (DIFLUCAN) 100 MG tablet Take once daily for 3 days   furosemide (LASIX) 20 MG tablet Take 1 tablet (20 mg total) by mouth daily. May take an additional tablet (20mg ) AS NEEDED for weight gain and/or swelling.   metoprolol tartrate (LOPRESSOR) 25 MG tablet Take 1 tablet (25 mg total) by mouth 2 (two) times daily.   ondansetron (ZOFRAN-ODT) 8 MG disintegrating tablet Take 1 tablet (8 mg total) by mouth every 8 (eight) hours as needed for nausea or vomiting.   pramipexole (MIRAPEX) 1 MG tablet Take 1 tablet (1 mg total) by mouth at bedtime.   promethazine (PHENERGAN) 25 MG tablet Take 1 tablet (25 mg total) by mouth every 8 (eight) hours as needed for nausea or vomiting.   rosuvastatin (CRESTOR) 5 MG tablet Take 1 tablet by mouth once daily   sertraline (ZOLOFT) 25 MG tablet Take 1 tablet (25 mg total) by mouth daily.   sitaGLIPtin (JANUVIA) 100 MG tablet Take 1 tablet (100 mg total) by mouth daily.   vedolizumab (ENTYVIO) 300 MG injection Inject into the vein. Every 4 weeks   [DISCONTINUED] QUEtiapine (SEROQUEL) 100 MG tablet Take 1 tablet (100 mg total) by mouth at bedtime.   [DISCONTINUED] senna-docusate (SENOKOT-S) 8.6-50 MG tablet Take 1 tablet by mouth daily.   [DISCONTINUED] polyethylene glycol (MIRALAX / GLYCOLAX) 17 g packet Take 17 g by mouth 2 (two) times daily.   No facility-administered encounter medications on file as of 02/18/2023.    Diagnostic Tests Reviewed/Disposition:      Latest Ref Rng & Units 02/10/2023   10:53 PM 02/07/2023    8:48 AM 02/06/2023    3:13 AM  CBC  WBC 4.0 - 10.5 K/uL 11.9  4.6  6.0   Hemoglobin 12.0 - 15.0 g/dL 40.9  81.1  91.4   Hematocrit 36.0 - 46.0 % 35.2  35.4  36.4   Platelets 150 - 400 K/uL 308  287  331        Latest Ref Rng & Units 02/10/2023   10:53 PM 02/08/2023    7:15 AM 02/07/2023    8:48 AM  CMP  Glucose 70 - 99 mg/dL 782  956  213   BUN 8 - 23 mg/dL <5  <5  <5   Creatinine 0.44 - 1.00 mg/dL 0.86  5.78  4.69   Sodium  135 - 145 mmol/L 137  133  133   Potassium 3.5 - 5.1 mmol/L 2.6  3.9  2.9   Chloride 98 - 111 mmol/L 107  100  100   CO2 22 - 32 mmol/L 24  25  26    Calcium 8.9 - 10.3 mg/dL 8.2  8.4  8.3   Total Protein 6.5 - 8.1 g/dL 6.1     Total Bilirubin 0.3 - 1.2 mg/dL 0.6     Alkaline Phos 38 - 126 U/L 158     AST 15 - 41 U/L 15     ALT 0 - 44 U/L 15       Consults: none  Discharge Instructions: Follow-up with PCP and urology  Disease/illness Education: Reviewed with patient at length today  Home Health/Community Services Discussions/Referrals: none  Establishment or re-establishment of referral orders for community resources: none  Discussion with other health care providers: Reviewed all recent notes  Assessment and Support of treatment regimen adherence: Reviewed with patient at length today  Appointments Coordinated with: Reviewed with patient at length today  Education for self-management, independent living, and ADLs:  Reviewed with patient at length today  Relevant past medical, surgical, family and social history reviewed and updated as indicated. Interim medical history since our last visit reviewed. Allergies and medications reviewed and updated.  Review of Systems  Constitutional:  Positive for chills. Negative for activity change, appetite change, diaphoresis, fatigue and fever.  Respiratory:  Negative for cough, chest tightness and shortness of breath.   Cardiovascular:  Negative for  chest pain, palpitations and leg swelling.  Gastrointestinal:  Positive for nausea (improving). Negative for abdominal distention, abdominal pain, constipation, diarrhea and vomiting.  Endocrine: Negative for polydipsia, polyphagia and polyuria.  Genitourinary:  Positive for dysuria (improving). Negative for frequency, hematuria and urgency.  Neurological: Negative.   Psychiatric/Behavioral: Negative.      Per HPI unless specifically indicated above     Objective:    BP 130/84   Pulse 92   Ht 5' (1.524 m)   Wt 111 lb (50.3 kg)   LMP  (LMP Unknown)   SpO2 100%   BMI 21.68 kg/m   Wt Readings from Last 3 Encounters:  02/18/23 111 lb (50.3 kg)  02/14/23 107 lb 9.6 oz (48.8 kg)  02/14/23 106 lb (48.1 kg)    Physical Exam Vitals and nursing note reviewed.  Constitutional:      General: She is awake. She is not in acute distress.    Appearance: She is well-developed, well-groomed and underweight. She is not ill-appearing or toxic-appearing.  HENT:     Head: Normocephalic.     Right Ear: Hearing and external ear normal.     Left Ear: Hearing and external ear normal.  Eyes:     General: Lids are normal.        Right eye: No discharge.        Left eye: No discharge.     Conjunctiva/sclera: Conjunctivae normal.     Pupils: Pupils are equal, round, and reactive to light.  Neck:     Thyroid: No thyromegaly.     Vascular: No carotid bruit.  Cardiovascular:     Rate and Rhythm: Normal rate and regular rhythm.     Heart sounds: Normal heart sounds. No murmur heard.    No gallop.  Pulmonary:     Effort: Pulmonary effort is normal. No accessory muscle usage or respiratory distress.     Breath sounds: Normal breath sounds. No  decreased breath sounds, wheezing or rhonchi.  Abdominal:     General: Bowel sounds are normal. There is no distension.     Palpations: Abdomen is soft.     Tenderness: There is generalized abdominal tenderness. There is no right CVA tenderness or left CVA  tenderness.     Comments: Tenderness noted generalized and diffuse, but improved from previous exam.  Musculoskeletal:     Cervical back: Normal range of motion and neck supple.     Right lower leg: No edema.     Left lower leg: No edema.  Lymphadenopathy:     Cervical: No cervical adenopathy.  Skin:    General: Skin is warm and dry.  Neurological:     Mental Status: She is alert and oriented to person, place, and time.     Deep Tendon Reflexes: Reflexes are normal and symmetric.     Reflex Scores:      Brachioradialis reflexes are 2+ on the right side and 2+ on the left side.      Patellar reflexes are 2+ on the right side and 2+ on the left side. Psychiatric:        Attention and Perception: Attention normal.        Mood and Affect: Mood normal.        Speech: Speech normal.        Behavior: Behavior normal. Behavior is cooperative.        Thought Content: Thought content normal.     Results for orders placed or performed during the hospital encounter of 02/11/23  CBC with Differential  Result Value Ref Range   WBC 11.9 (H) 4.0 - 10.5 K/uL   RBC 4.00 3.87 - 5.11 MIL/uL   Hemoglobin 11.4 (L) 12.0 - 15.0 g/dL   HCT 19.1 (L) 47.8 - 29.5 %   MCV 88.0 80.0 - 100.0 fL   MCH 28.5 26.0 - 34.0 pg   MCHC 32.4 30.0 - 36.0 g/dL   RDW 62.1 30.8 - 65.7 %   Platelets 308 150 - 400 K/uL   nRBC 0.0 0.0 - 0.2 %   Neutrophils Relative % 80 %   Neutro Abs 9.5 (H) 1.7 - 7.7 K/uL   Lymphocytes Relative 15 %   Lymphs Abs 1.8 0.7 - 4.0 K/uL   Monocytes Relative 4 %   Monocytes Absolute 0.5 0.1 - 1.0 K/uL   Eosinophils Relative 1 %   Eosinophils Absolute 0.1 0.0 - 0.5 K/uL   Basophils Relative 0 %   Basophils Absolute 0.0 0.0 - 0.1 K/uL   Immature Granulocytes 0 %   Abs Immature Granulocytes 0.04 0.00 - 0.07 K/uL  Comprehensive metabolic panel  Result Value Ref Range   Sodium 137 135 - 145 mmol/L   Potassium 2.6 (LL) 3.5 - 5.1 mmol/L   Chloride 107 98 - 111 mmol/L   CO2 24 22 - 32  mmol/L   Glucose, Bld 165 (H) 70 - 99 mg/dL   BUN <5 (L) 8 - 23 mg/dL   Creatinine, Ser 8.46 0.44 - 1.00 mg/dL   Calcium 8.2 (L) 8.9 - 10.3 mg/dL   Total Protein 6.1 (L) 6.5 - 8.1 g/dL   Albumin 3.0 (L) 3.5 - 5.0 g/dL   AST 15 15 - 41 U/L   ALT 15 0 - 44 U/L   Alkaline Phosphatase 158 (H) 38 - 126 U/L   Total Bilirubin 0.6 0.3 - 1.2 mg/dL   GFR, Estimated >96 >29 mL/min   Anion gap 6  5 - 15  Urinalysis, Routine w reflex microscopic -Urine, Clean Catch  Result Value Ref Range   Color, Urine STRAW (A) YELLOW   APPearance CLEAR (A) CLEAR   Specific Gravity, Urine 1.001 (L) 1.005 - 1.030   pH 6.0 5.0 - 8.0   Glucose, UA 50 (A) NEGATIVE mg/dL   Hgb urine dipstick NEGATIVE NEGATIVE   Bilirubin Urine NEGATIVE NEGATIVE   Ketones, ur NEGATIVE NEGATIVE mg/dL   Protein, ur NEGATIVE NEGATIVE mg/dL   Nitrite NEGATIVE NEGATIVE   Leukocytes,Ua SMALL (A) NEGATIVE   RBC / HPF 0-5 0 - 5 RBC/hpf   WBC, UA 0-5 0 - 5 WBC/hpf   Bacteria, UA FEW (A) NONE SEEN   Squamous Epithelial / HPF 0-5 0 - 5 /HPF  Magnesium  Result Value Ref Range   Magnesium 2.1 1.7 - 2.4 mg/dL  CBG monitoring, ED  Result Value Ref Range   Glucose-Capillary 144 (H) 70 - 99 mg/dL      Assessment & Plan:   Problem List Items Addressed This Visit       Endocrine   Diabetes mellitus type 2 in nonobese (HCC) (Chronic)    Chronic, stable with A1c at 6.5% one week ago.  She stopped Mounjaro due to further weight loss and increased nausea.  Can not take Metformin due to GI effects.  Would avoid SGLT2 at this time due to her recurrent UTI and recent pyelonephritis.  At this time: - Trial Januvia 100 MG daily, this may benefit lowering sugars and hae less side effects.  Plan for recheck of A1c in 3 months and if trend up consider low dose Glipizide, although prefer to avoid this and insulin if possible. - Recommend heavy focus on diet and regular activity daily. - Check BS 2-3 times daily and document for provider visits.   -  No ACE or ARB on board, could consider in future, will hold off at this time due to recent illness.  Maintain Statin. - Eye and foot exams up to date.      Relevant Medications   sitaGLIPtin (JANUVIA) 100 MG tablet     Genitourinary   AKI (acute kidney injury) (HCC) - Primary    Acute and improving, secondary to pyelonephritis.  Recheck labs today.      Relevant Orders   CBC with Differential/Platelet   Comprehensive metabolic panel   Atrophic vaginitis    Chronic and ongoing.  She is to restart Estrace per urology.      Pyelonephritis    Acute and improving.  Continue collaboration with urology, recent note and plan reviewed.  They obtained urine sample today.  In office will check CBC and CMP.      Recurrent UTI    Chronic and ongoing.  Continue collaboration with urology and current treatment regimen as recommended by them.  Recent notes reviewed from today.  They obtained a UA.      Other Visit Diagnoses     Hypokalemia       Check CMP today and replace as needed.   Relevant Orders   Comprehensive metabolic panel        Follow up plan: Return in about 4 weeks (around 03/18/2023) for T2DM -- added Januvia.

## 2023-02-18 NOTE — Progress Notes (Signed)
02/18/2023 9:12 AM   Amy Hansen 25-Dec-1955 [id number]  Referring provider: [name], NP [address] [place],  [place] [address]  Chief Complaint  Patient presents with   Recurrent UTI    HPI: Reviewed my last note It appears the patient was given [facility] February 2024.  On March 28 she had Ceftin 250 mg twice a day for 7 days and then back in February she also had doxycycline.  She was referred to infectious disease by someone but did not go.  Her last culture was sensitive to sulfa Macrodantin ciprofloxacin and Augmentin and ampicillin as well as doxycycline.  She is allergic to moxifloxacin Bactrim Cipro with anaphylaxis to the moxifloxacin.  The culture prior to this in February demonstrated 2 organisms with different sensitivity and resistance patterns Urine sent for culture. Patient was given Augmentin 1 tablet twice a day for 7 days. Patient will then add on daily Keflex. Hopefully this helps decrease infections. We might have to go back to Macrobid if needed. She can take Macrodantin and Keflex. She has not been on prophylaxis for many months   Today Frequency stable.  Urine culture was + April 22.  It was sensitive to doxycycline and resistant to Augmentin.  She had a positive culture February 05 2023 with 2 positive organisms.  Both sensitive to sulfa Levaquin ciprofloxacin.  Patient went to the emergency room July 2024.  She had numbness to her face.  She also had left upper quadrant pain present for many weeks.  It was felt to be GI in origin.  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  Then she went to the emergency room here in [place] in July.  He is currently treated with primary care with amoxicillin for urinary tract infection.  She says she has low back pain  improving cloudy urine and burning.  She is not taking her Estrace cream.  I did not see another urine culture since July 9.  The amoxicillin she is on would cover her Proteus from the last culture but not the Klebsiella.  Patient has not been on her daily Keflex since April.         PMH: Past Medical History:  Diagnosis Date   Anemia    Anxiety    Arthritis    Blood transfusion without reported diagnosis    Cataract    CHF (congestive heart failure) (HCC)    Chronic kidney disease    UTI, hematuria in urine   Colitis    Crohn's disease (HCC)    Depression    Diabetes (HCC)    Diverticulosis    Frequent headaches    Interstitial cystitis    Recurrent UTI    Restless leg syndrome    TIA (transient ischemic attack) 02/20/2021   Urinary frequency     Surgical History: Past Surgical History:  Procedure Laterality Date   bariatric bypass  2012   BIOPSY  05/03/2020   Procedure: BIOPSY;  Surgeon: Rachael Fee, MD;  Location: [facility] ENDOSCOPY;  Service: Endoscopy;;   CARPAL TUNNEL RELEASE Right 2003   CARPAL TUNNEL RELEASE Right    2008   CHOLECYSTECTOMY  1975   COLONOSCOPY     COLONOSCOPY WITH PROPOFOL N/A 05/03/2020   Procedure: COLONOSCOPY WITH PROPOFOL;  Surgeon: Rachael Fee, MD;  Location: [facility] ENDOSCOPY;  Service: Endoscopy;  Laterality: N/A;   CYSTOSCOPY W/ RETROGRADES Bilateral 06/06/2015   Procedure: CYSTOSCOPY WITH RETROGRADE PYELOGRAM;  Surgeon: Jerilee Field, MD;  Location: [facility] ORS;  Service: Urology;  Laterality: Bilateral;   EYE SURGERY Left 07/06/2022   FL INJ LEFT KNEE CT ARTHROGRAM ([facility] HX) Left    1995   GASTRIC BYPASS  2010   HAND SURGERY Left 01/19/2021   Thumb   HEMORRHOID SURGERY  2013   KNEE ARTHROSCOPY Left 1996   TONSILLECTOMY     TOTAL ABDOMINAL HYSTERECTOMY W/ BILATERAL SALPINGOOPHORECTOMY      Home Medications:  Allergies as of 02/18/2023       Reactions   Avelox [moxifloxacin Hcl In Nacl] Anaphylaxis   Bactrim  [sulfamethoxazole-trimethoprim] Anaphylaxis   Ciprofloxacin Other (See Comments)   Pt states she was told never to take this as it is in the same family as Avelox.    Buspar [buspirone] Other (See Comments)   hallucinations   Neomycin-polymyxin-dexameth Other (See Comments)   Burning in the eyes   Aquaphor [lanolin-petrolatum] Itching, Rash   Depakote [divalproex Sodium] Other (See Comments)   Unknown Reaction   Imitrex [sumatriptan] Other (See Comments)   Neck and shoulder pain   Stadol [butorphanol] Rash        Medication List        Accurate as of February 18, 2023  9:12 AM. If you have any questions, ask your nurse or doctor.          STOP taking these medications    QUEtiapine 100 MG tablet Commonly known as: SEROQUEL Stopped by: Marjie Skiff       TAKE these medications    ALPRAZolam 0.5 MG tablet Commonly known as: Xanax Take 1 tablet (0.5 mg total) by mouth 2 (two) times daily as needed for anxiety.   ampicillin 500 MG capsule Commonly known as: PRINCIPEN Take 1 capsule (500 mg total) by mouth 4 (four) times daily for 7 days.   aspirin EC 81 MG tablet Take 1 tablet (81 mg) by mouth once daily. Swallow whole.   famotidine 20 MG tablet Commonly known as: PEPCID Take 1 tablet (20 mg total) by mouth 2 (two) times daily.   furosemide 20 MG tablet Commonly known as: LASIX Take 1 tablet (20 mg total) by mouth daily. May take an additional tablet (20mg ) AS NEEDED for weight gain and/or swelling.   metoprolol tartrate 25 MG tablet Commonly known as: LOPRESSOR Take 1 tablet (25 mg total) by mouth 2 (two) times daily.   ondansetron 8 MG disintegrating tablet Commonly known as: ZOFRAN-ODT Take 1 tablet (8 mg total) by mouth every 8 (eight) hours as needed for nausea or vomiting.   polyethylene glycol 17 g packet Commonly known as: MIRALAX / GLYCOLAX Take 17 g by mouth 2 (two) times daily.   pramipexole 1 MG tablet Commonly known as: MIRAPEX Take 1  tablet (1 mg total) by mouth at bedtime.   promethazine 25 MG tablet Commonly known as: PHENERGAN Take 1 tablet (25 mg total) by mouth every 8 (eight) hours as needed for nausea or vomiting.   rosuvastatin 5 MG tablet Commonly known as: CRESTOR Take 1 tablet by mouth once daily   senna-docusate 8.6-50 MG tablet Commonly known as: Senokot-S Take 1 tablet by mouth daily.   sertraline 25 MG tablet Commonly known as: Zoloft Take 1 tablet (25 mg total) by mouth daily.   vedolizumab 300 MG injection Commonly known as: ENTYVIO Inject  into the vein. Every 4 weeks        Allergies:  Allergies  Allergen Reactions   Avelox [Moxifloxacin Hcl In Nacl] Anaphylaxis   Bactrim [Sulfamethoxazole-Trimethoprim] Anaphylaxis   Ciprofloxacin Other (See Comments)    Pt states she was told never to take this as it is in the same family as Avelox.    Buspar [Buspirone] Other (See Comments)    hallucinations   Neomycin-Polymyxin-Dexameth Other (See Comments)    Burning in the eyes   Aquaphor [Lanolin-Petrolatum] Itching and Rash   Depakote [Divalproex Sodium] Other (See Comments)    Unknown Reaction   Imitrex [Sumatriptan] Other (See Comments)    Neck and shoulder pain   Stadol [Butorphanol] Rash    Family History: Family History  Problem Relation Age of Onset   Colon cancer Mother    Stroke Father    Heart failure Sister    Bladder Cancer Neg Hx    Kidney disease Neg Hx    Prostate cancer Neg Hx    Kidney cancer Neg Hx    Pancreatic cancer Neg Hx    Esophageal cancer Neg Hx    Stomach cancer Neg Hx    Rectal cancer Neg Hx    Breast cancer Neg Hx     Social History:  reports that she quit smoking about 47 years ago. Her smoking use included cigarettes. She has never used smokeless tobacco. She reports that she does not drink alcohol and does not use drugs.  ROS:                                        Physical Exam: LMP  (LMP Unknown)   Constitutional:   Alert and oriented, No acute distress. HEENT: [place] AT, moist mucus membranes.  Trachea midline, no masses.   Laboratory Data: Lab Results  Component Value Date   WBC 11.9 (H) 02/10/2023   HGB 11.4 (L) 02/10/2023   HCT 35.2 (L) 02/10/2023   MCV 88.0 02/10/2023   PLT 308 02/10/2023    Lab Results  Component Value Date   CREATININE 0.58 02/10/2023    No results found for: "PSA"  No results found for: "TESTOSTERONE"  Lab Results  Component Value Date   HGBA1C 6.5 (H) 02/04/2023    Urinalysis    Component Value Date/Time   COLORURINE STRAW (A) 02/10/2023 2252   APPEARANCEUR CLEAR (A) 02/10/2023 2252   APPEARANCEUR Cloudy (A) 02/04/2023 1517   LABSPEC 1.001 (L) 02/10/2023 2252   LABSPEC 1.003 01/09/2012 2126   PHURINE 6.0 02/10/2023 2252   GLUCOSEU 50 (A) 02/10/2023 2252   GLUCOSEU Negative 01/09/2012 2126   HGBUR NEGATIVE 02/10/2023 2252   BILIRUBINUR NEGATIVE 02/10/2023 2252   BILIRUBINUR Negative 02/04/2023 1517   BILIRUBINUR Negative 01/09/2012 2126   KETONESUR NEGATIVE 02/10/2023 2252   PROTEINUR NEGATIVE 02/10/2023 2252   NITRITE NEGATIVE 02/10/2023 2252   LEUKOCYTESUR SMALL (A) 02/10/2023 2252   LEUKOCYTESUR Negative 01/09/2012 2126    Pertinent Imaging:   Assessment & Plan: Urine reviewed and sent for culture.  Patient understands he has a difficult recurrent UTI issue.  Go back on Estrace 2-3 times a week.  See infectious disease for long-term follow-up and treatment of these difficult cultures.  Call if culture differs.  Reassess 6 months  Patient wants to go back on Estrace and daily Keflex recognizing limitations especially of the prophylactic antibiotic.  She is going  to see her infectious disease physician in [facility] Dr. Luciana Axe.  Will call with culture results.  She is having some vaginal itchiness and I gave her 3 days of Diflucan.  1. Recurrent UTI  - Urinalysis, Complete   No follow-ups on file.  Martina Sinner, MD  [facility]  [facility] [address], [address] [place], [place] [address] [phone]

## 2023-02-18 NOTE — Assessment & Plan Note (Signed)
Chronic and ongoing.  She is to restart Estrace per urology.

## 2023-02-18 NOTE — Assessment & Plan Note (Addendum)
Chronic and ongoing.  Continue collaboration with urology and current treatment regimen as recommended by them.  Recent notes reviewed from today.  They obtained a UA.

## 2023-02-19 LAB — CBC WITH DIFFERENTIAL/PLATELET
Basophils Absolute: 0 10*3/uL (ref 0.0–0.2)
Basos: 1 %
EOS (ABSOLUTE): 0.1 10*3/uL (ref 0.0–0.4)
Eos: 1 %
Hematocrit: 33.9 % — ABNORMAL LOW (ref 34.0–46.6)
Hemoglobin: 10.5 g/dL — ABNORMAL LOW (ref 11.1–15.9)
Immature Grans (Abs): 0 10*3/uL (ref 0.0–0.1)
Immature Granulocytes: 1 %
Lymphocytes Absolute: 1.8 10*3/uL (ref 0.7–3.1)
Lymphs: 41 %
MCH: 27.7 pg (ref 26.6–33.0)
MCHC: 31 g/dL — ABNORMAL LOW (ref 31.5–35.7)
MCV: 89 fL (ref 79–97)
Monocytes Absolute: 0.3 10*3/uL (ref 0.1–0.9)
Monocytes: 6 %
Neutrophils Absolute: 2.2 10*3/uL (ref 1.4–7.0)
Neutrophils: 50 %
Platelets: 464 10*3/uL — ABNORMAL HIGH (ref 150–450)
RBC: 3.79 x10E6/uL (ref 3.77–5.28)
RDW: 13.3 % (ref 11.7–15.4)
WBC: 4.3 10*3/uL (ref 3.4–10.8)

## 2023-02-19 LAB — COMPREHENSIVE METABOLIC PANEL
ALT: 84 IU/L — ABNORMAL HIGH (ref 0–32)
AST: 164 IU/L — ABNORMAL HIGH (ref 0–40)
Albumin: 3.3 g/dL — ABNORMAL LOW (ref 3.9–4.9)
Alkaline Phosphatase: 202 IU/L — ABNORMAL HIGH (ref 44–121)
BUN/Creatinine Ratio: 14 (ref 12–28)
BUN: 7 mg/dL — ABNORMAL LOW (ref 8–27)
Bilirubin Total: <0.2 mg/dL (ref 0.0–1.2)
CO2: 21 mmol/L (ref 20–29)
Calcium: 8.2 mg/dL — ABNORMAL LOW (ref 8.7–10.3)
Chloride: 105 mmol/L (ref 96–106)
Creatinine, Ser: 0.51 mg/dL — ABNORMAL LOW (ref 0.57–1.00)
Globulin, Total: 2.6 g/dL (ref 1.5–4.5)
Glucose: 339 mg/dL — ABNORMAL HIGH (ref 70–99)
Potassium: 3.3 mmol/L — ABNORMAL LOW (ref 3.5–5.2)
Sodium: 139 mmol/L (ref 134–144)
Total Protein: 5.9 g/dL — ABNORMAL LOW (ref 6.0–8.5)
eGFR: 104 mL/min/{1.73_m2} (ref >59)

## 2023-02-19 LAB — URINALYSIS, COMPLETE
Bilirubin, UA: NEGATIVE
Ketones, UA: NEGATIVE
Nitrite, UA: NEGATIVE
Protein,UA: NEGATIVE
Specific Gravity, UA: 1.01 (ref 1.005–1.030)
Urobilinogen, Ur: 0.2 mg/dL (ref 0.2–1.0)
pH, UA: 5.5 (ref 5.0–7.5)

## 2023-02-19 LAB — MICROSCOPIC EXAMINATION

## 2023-02-19 NOTE — Progress Notes (Signed)
Contacted via MyChart   Good afternoon Amy Hansen, your labs have returned: - Hemoglobin and hematocrit have decreased a little, anemia, please ensure you are scheduled to follow-up with hematology to monitor this closely. Continue supplements at home.  Platelets are mildly elevated and we can continue to monitor these. - Liver function has trended up -- this may be related to recent illness and sugars trending up.  Start Januvia as we discussed and if this causes issues we may need to consider insulin.  We will recheck next visit.  - Potassium remains a little low.  I will send in 5 days of supplement to help this.  Do you prefer powder vs the oral pills?  Let me know.  We will recheck next visit.   - Kidney function is stable. Any questions?

## 2023-02-20 ENCOUNTER — Encounter: Payer: Self-pay | Admitting: Urology

## 2023-02-20 LAB — CULTURE, URINE COMPREHENSIVE

## 2023-02-20 MED ORDER — POTASSIUM CHLORIDE CRYS ER 10 MEQ PO TBCR: 10.0000 meq | EXTENDED_RELEASE_TABLET | Freq: Every day | ORAL | 0 refills | Status: DC

## 2023-02-20 NOTE — Addendum Note (Signed)
Addended by: Aura Dials T on: 02/20/2023 08:10 AM   Modules accepted: Orders

## 2023-02-20 NOTE — Telephone Encounter (Signed)
-----   Message from [facility] Marylu Lund H sent at 11/26/2022  3:16 PM EDT ----- Regarding: labs due in late July Patient due for cbc and ibc/ferritin within the week

## 2023-02-20 NOTE — Telephone Encounter (Signed)
MyChart message sent to patient to go to the lab 

## 2023-02-21 ENCOUNTER — Telehealth: Payer: Self-pay | Admitting: Internal Medicine

## 2023-02-21 ENCOUNTER — Encounter: Payer: Self-pay | Admitting: Physician Assistant

## 2023-02-21 ENCOUNTER — Ambulatory Visit (INDEPENDENT_AMBULATORY_CARE_PROVIDER_SITE_OTHER): Payer: BC Managed Care – PPO | Admitting: Physician Assistant

## 2023-02-21 VITALS — BP 127/82 | HR 87 | Ht 60.0 in | Wt 110.2 lb

## 2023-02-21 DIAGNOSIS — N390 Urinary tract infection, site not specified: Secondary | ICD-10-CM

## 2023-02-21 DIAGNOSIS — R82998 Other abnormal findings in urine: Secondary | ICD-10-CM

## 2023-02-21 DIAGNOSIS — N958 Other specified menopausal and perimenopausal disorders: Secondary | ICD-10-CM

## 2023-02-21 DIAGNOSIS — Z87440 Personal history of urinary (tract) infections: Secondary | ICD-10-CM | POA: Diagnosis not present

## 2023-02-21 LAB — CULTURE, URINE COMPREHENSIVE

## 2023-02-21 MED ORDER — FUROSEMIDE 20 MG PO TABS: 20.0000 mg | ORAL_TABLET | Freq: Every day | ORAL | 0 refills | Status: DC

## 2023-02-21 NOTE — Patient Instructions (Addendum)
Restart topical vaginal estrogen cream. Apply this daily for two weeks, then Monday, Wednesday, and Friday forever. Use coconut oil for moisture and itching relief. Follow up with infectious diseases and gynecology. Continue Keflex. Ok to stop ampicillin.

## 2023-02-21 NOTE — Progress Notes (Signed)
02/21/2023 5:12 PM   Amy Hansen [birth year] [id number]  CC: Chief Complaint  Patient presents with   Follow-up   HPI: Amy Hansen is a 67 y.o. female with PMH recurrent UTI versus urinary colonization on suppressive Keflex, IC, left renal stone, and gross hematuria with benign workup earlier this year who presents today for follow-up of vulvovaginal irritation.   She was seen in clinic by Dr. Sherron Monday 3 days ago and told to resume estrogen cream and daily Keflex, follow-up with ID, and treat reported vaginal pruritus with 3 days of Diflucan p.o.  Today she reports she has not yet picked up her estrogen cream.  She had been taking ampicillin per her PCP for possible UTI, but she stopped this yesterday because she did not know if it was contributing to her vulvovaginal irritation.  She completed 3 days of Diflucan per Dr. Sherron Monday but has not noticed any improvement in her symptoms.  She reports this is so severe that she is not able to tolerate underwear.  Notably, urine culture from earlier this week grew low colony counts of MDR Klebsiella resistant to Augmentin.  In-office catheterized UA today positive for 1+ leukocytes, 2+ glucose; urine microscopy with >30 WBCs/HPF and many bacteria.  Measured residual 70mL.  PMH: Past Medical History:  Diagnosis Date   Anemia    Anxiety    Arthritis    Blood transfusion without reported diagnosis    Cataract    CHF (congestive heart failure) (HCC)    Chronic kidney disease    UTI, hematuria in urine   Colitis    Crohn's disease (HCC)    Depression    Diabetes (HCC)    Diverticulosis    Frequent headaches    Interstitial cystitis    Recurrent UTI    Restless leg syndrome    TIA (transient ischemic attack) 02/20/2021   Urinary frequency     Surgical History: Past Surgical History:  Procedure Laterality Date   bariatric bypass  2012   BIOPSY  05/03/2020   Procedure: BIOPSY;  Surgeon: Rachael Fee, MD;  Location:  [facility] ENDOSCOPY;  Service: Endoscopy;;   CARPAL TUNNEL RELEASE Right 2003   CARPAL TUNNEL RELEASE Right    2008   CHOLECYSTECTOMY  1975   COLONOSCOPY     COLONOSCOPY WITH PROPOFOL N/A 05/03/2020   Procedure: COLONOSCOPY WITH PROPOFOL;  Surgeon: Rachael Fee, MD;  Location: [facility] ENDOSCOPY;  Service: Endoscopy;  Laterality: N/A;   CYSTOSCOPY W/ RETROGRADES Bilateral 06/06/2015   Procedure: CYSTOSCOPY WITH RETROGRADE PYELOGRAM;  Surgeon: Jerilee Field, MD;  Location: [facility] ORS;  Service: Urology;  Laterality: Bilateral;   EYE SURGERY Left 07/06/2022   FL INJ LEFT KNEE CT ARTHROGRAM ([facility] HX) Left    1995   GASTRIC BYPASS  2010   HAND SURGERY Left 01/19/2021   Thumb   HEMORRHOID SURGERY  2013   KNEE ARTHROSCOPY Left 1996   TONSILLECTOMY     TOTAL ABDOMINAL HYSTERECTOMY W/ BILATERAL SALPINGOOPHORECTOMY      Home Medications:  Allergies as of 02/21/2023       Reactions   Avelox [moxifloxacin Hcl In Nacl] Anaphylaxis   Bactrim [sulfamethoxazole-trimethoprim] Anaphylaxis   Ciprofloxacin Other (See Comments)   Pt states she was told never to take this as it is in the same family as Avelox.    Buspar [buspirone] Other (See Comments)   hallucinations   Neomycin-polymyxin-dexameth Other (See Comments)   Burning in the eyes   Aquaphor [lanolin-petrolatum] Itching, Rash  Depakote [divalproex Sodium] Other (See Comments)   Unknown Reaction   Imitrex [sumatriptan] Other (See Comments)   Neck and shoulder pain   Stadol [butorphanol] Rash        Medication List        Accurate as of February 21, 2023  5:12 PM. If you have any questions, ask your nurse or doctor.          ALPRAZolam 0.5 MG tablet Commonly known as: Xanax Take 1 tablet (0.5 mg total) by mouth 2 (two) times daily as needed for anxiety.   aspirin EC 81 MG tablet Take 1 tablet (81 mg) by mouth once daily. Swallow whole.   cephALEXin 250 MG capsule Commonly known as: KEFLEX Take 250 mg by mouth daily.    estradiol 0.1 MG/GM vaginal cream Commonly known as: ESTRACE Estrogen Cream Instruction  Discard applicator  Apply pea sized amount to tip of finger to urethra before bed. Wash hands well after application. Use Monday, Wednesday and Friday   famotidine 20 MG tablet Commonly known as: PEPCID Take 1 tablet (20 mg total) by mouth 2 (two) times daily.   fluconazole 100 MG tablet Commonly known as: DIFLUCAN Take once daily for 3 days   furosemide 20 MG tablet Commonly known as: LASIX Take 1 tablet (20 mg total) by mouth daily. May take an additional tablet (20mg ) AS NEEDED for weight gain and/or swelling.   metoprolol tartrate 25 MG tablet Commonly known as: LOPRESSOR Take 1 tablet (25 mg total) by mouth 2 (two) times daily.   ondansetron 8 MG disintegrating tablet Commonly known as: ZOFRAN-ODT Take 1 tablet (8 mg total) by mouth every 8 (eight) hours as needed for nausea or vomiting.   potassium chloride 10 MEQ tablet Commonly known as: KLOR-CON M Take 1 tablet (10 mEq total) by mouth daily.   potassium chloride 10 MEQ tablet Commonly known as: KLOR-CON Take 10 mEq by mouth daily.   pramipexole 1 MG tablet Commonly known as: MIRAPEX Take 1 tablet (1 mg total) by mouth at bedtime.   Premarin vaginal cream Generic drug: conjugated estrogens SMARTSIG:1 Vaginal Daily   promethazine 25 MG tablet Commonly known as: PHENERGAN Take 1 tablet (25 mg total) by mouth every 8 (eight) hours as needed for nausea or vomiting.   rosuvastatin 5 MG tablet Commonly known as: CRESTOR Take 1 tablet by mouth once daily   sertraline 25 MG tablet Commonly known as: Zoloft Take 1 tablet (25 mg total) by mouth daily.   sitaGLIPtin 100 MG tablet Commonly known as: Januvia Take 1 tablet (100 mg total) by mouth daily.   vedolizumab 300 MG injection Commonly known as: ENTYVIO Inject into the vein. Every 4 weeks        Allergies:  Allergies  Allergen Reactions   Avelox  [Moxifloxacin Hcl In Nacl] Anaphylaxis   Bactrim [Sulfamethoxazole-Trimethoprim] Anaphylaxis   Ciprofloxacin Other (See Comments)    Pt states she was told never to take this as it is in the same family as Avelox.    Buspar [Buspirone] Other (See Comments)    hallucinations   Neomycin-Polymyxin-Dexameth Other (See Comments)    Burning in the eyes   Aquaphor [Lanolin-Petrolatum] Itching and Rash   Depakote [Divalproex Sodium] Other (See Comments)    Unknown Reaction   Imitrex [Sumatriptan] Other (See Comments)    Neck and shoulder pain   Stadol [Butorphanol] Rash    Family History: Family History  Problem Relation Age of Onset   Colon cancer Mother  Stroke Father    Heart failure Sister    Bladder Cancer Neg Hx    Kidney disease Neg Hx    Prostate cancer Neg Hx    Kidney cancer Neg Hx    Pancreatic cancer Neg Hx    Esophageal cancer Neg Hx    Stomach cancer Neg Hx    Rectal cancer Neg Hx    Breast cancer Neg Hx     Social History:   reports that she quit smoking about 47 years ago. Her smoking use included cigarettes. She has never used smokeless tobacco. She reports that she does not drink alcohol and does not use drugs.  Physical Exam: BP 127/82   Pulse 87   Ht 5' (1.524 m)   Wt 110 lb 3.2 oz (50 kg)   LMP  (LMP Unknown)   BMI 21.52 kg/m   Constitutional:  Alert and oriented, no acute distress, nontoxic appearing HEENT: , AT Cardiovascular: No clubbing, cyanosis, or edema Respiratory: Normal respiratory effort, no increased work of breathing GU: Sparse pubic hair.  Labia are pale, shiny, and dry.  Apparent loss of volume of the labia majora.  Moderate clitoral phimosis.  Some fusion of the labia minora.  Erythema and thickening at the borders of the labia majora. Skin: No rashes, bruises or suspicious lesions Neurologic: Grossly intact, no focal deficits, moving all 4 extremities Psychiatric: Normal mood and affect  Laboratory Data: Results for orders placed  or performed in visit on 02/21/23  Microscopic Examination   Urine  Result Value Ref Range   WBC, UA >30 (A) 0 - 5 /hpf   RBC, Urine 0-2 0 - 2 /hpf   Epithelial Cells (non renal) 0-10 0 - 10 /hpf   Bacteria, UA Many (A) None seen/Few  Urinalysis, Complete  Result Value Ref Range   Specific Gravity, UA 1.015 1.005 - 1.030   pH, UA 6.0 5.0 - 7.5   Color, UA Yellow Yellow   Appearance Ur Cloudy (A) Clear   Leukocytes,UA 1+ (A) Negative   Protein,UA Negative Negative/Trace   Glucose, UA 2+ (A) Negative   Ketones, UA Negative Negative   RBC, UA Negative Negative   Bilirubin, UA Negative Negative   Urobilinogen, Ur 0.2 0.2 - 1.0 mg/dL   Nitrite, UA Negative Negative   Microscopic Examination See below:    In and Out Catheterization  Patient is present today for a I & O catheterization due to rUTI. Patient was cleaned and prepped in a sterile fashion with betadine . A 14FR cath was inserted no complications were noted , 70ml of urine return was noted, urine was yellow in color. A clean urine sample was collected for UA/culture. Bladder was drained and catheter was removed without difficulty.    Performed by: Carman Ching, PA-C   Assessment & Plan:   1. Genitourinary syndrome of menopause Vulvovaginal atrophy noted on physical exam today.  I think this is largely contributory to her symptoms.  Labia majora have a chronically irritated, thickened appearance today.  Unclear if there is a candidal element to this earlier in the week, but I would anticipate that 3 doses of oral Diflucan would have cleared this.  We discussed the importance of resuming topical vaginal estrogen cream and I encouraged her to also use coconut oil to restore moisture and create a barrier for symptom relief.  I did encourage her to follow-up with gynecology for further evaluation and input as well.  2. Recurrent UTI Recurrent UTI versus colonization versus  chronic cystitis.  Her UA is suspicious today,  which is typical for her at baseline.  I would rather repeat a culture with this catheterized sample and let that drive antibiotic therapy then put her on another round of empiric antibiotics.  I do think she can stay off the ampicillin based on culture results from Monday.  I agree with having her follow-up with ID, as her bladder picture is very complex. - Urinalysis, Complete - CULTURE, URINE COMPREHENSIVE   Return for Will call with results.  Carman Ching, PA-C  [facility] [place] [address], [address] [place], [place] [address] [phone]

## 2023-02-21 NOTE — Telephone Encounter (Signed)
*  STAT* If patient is at the pharmacy, call can be transferred to refill team.   1. Which medications need to be refilled? (please list name of each medication and dose if known)   furosemide (LASIX) 20 MG tablet   2. Would you like to learn more about the convenience, safety, & potential cost savings by using the [facility]?   3. Are you open to using the [facility] (Type [facility]. ).   4. Which pharmacy/location (including street and city if local pharmacy) is medication to be sent to?  [facility] - [place], [place] - [address]   5. Do they need a 30 day or 90 day supply?   90 day  Patient stated she is completely out of this medication.  Patient has appointment scheduled on 9/4.

## 2023-02-22 LAB — MICROSCOPIC EXAMINATION: WBC, UA: >30 /hpf — AB (ref 0–5)

## 2023-02-25 ENCOUNTER — Other Ambulatory Visit: Payer: Self-pay

## 2023-02-25 MED ORDER — DOXYCYCLINE HYCLATE 100 MG PO CAPS: 100.0000 mg | ORAL_CAPSULE | Freq: Two times a day (BID) | ORAL | 0 refills | Status: DC

## 2023-02-26 ENCOUNTER — Ambulatory Visit: Payer: Medicare Other | Admitting: Physician Assistant

## 2023-02-26 ENCOUNTER — Encounter: Payer: Self-pay | Admitting: Gastroenterology

## 2023-02-26 ENCOUNTER — Other Ambulatory Visit: Payer: Self-pay

## 2023-02-26 ENCOUNTER — Telehealth: Payer: Self-pay | Admitting: Physician Assistant

## 2023-02-26 ENCOUNTER — Encounter: Payer: Self-pay | Admitting: Oncology

## 2023-02-26 DIAGNOSIS — N898 Other specified noninflammatory disorders of vagina: Secondary | ICD-10-CM | POA: Diagnosis not present

## 2023-02-26 MED ORDER — ALPRAZOLAM 0.5 MG PO TABS: 0.5000 mg | ORAL_TABLET | Freq: Two times a day (BID) | ORAL | 0 refills | Status: DC | PRN

## 2023-02-26 NOTE — Telephone Encounter (Signed)
Pended.

## 2023-02-26 NOTE — Telephone Encounter (Signed)
Next appt is 03/29/23. Amy Hansen is requesting a refill on her Alprazolam 0.5 mg called to:  [facility] - [place], [place] - [address]   Phone: [phone]  Fax: [phone]

## 2023-02-27 ENCOUNTER — Ambulatory Visit (HOSPITAL_COMMUNITY): Payer: BC Managed Care – PPO

## 2023-02-28 DIAGNOSIS — I639 Cerebral infarction, unspecified: Secondary | ICD-10-CM | POA: Diagnosis not present

## 2023-02-28 DIAGNOSIS — G459 Transient cerebral ischemic attack, unspecified: Secondary | ICD-10-CM | POA: Diagnosis not present

## 2023-02-28 DIAGNOSIS — R200 Anesthesia of skin: Secondary | ICD-10-CM | POA: Diagnosis not present

## 2023-02-28 DIAGNOSIS — G629 Polyneuropathy, unspecified: Secondary | ICD-10-CM | POA: Diagnosis not present

## 2023-03-01 ENCOUNTER — Encounter: Payer: Self-pay | Admitting: Gastroenterology

## 2023-03-01 ENCOUNTER — Encounter: Payer: Self-pay | Admitting: Oncology

## 2023-03-05 ENCOUNTER — Other Ambulatory Visit (HOSPITAL_COMMUNITY): Payer: Self-pay

## 2023-03-07 ENCOUNTER — Telehealth: Payer: Self-pay | Admitting: Pharmacy Technician

## 2023-03-07 ENCOUNTER — Other Ambulatory Visit: Payer: Self-pay | Admitting: Physician Assistant

## 2023-03-07 NOTE — Telephone Encounter (Signed)
Auth Submission: APPROVED Site of care: Site of care: [facility] Payer: [employer]BS Medication & CPT/J Code(s) submitted: Entyvio (Vedolizumab) [id number] Route of submission (phone, fax, portal):  Phone # Fax # Auth type: Buy/Bill PB Units/visits requested: 3900 UNITS 12 DOSES Reference number: [id number] Approval from: 03/06/23 to 03/05/24

## 2023-03-11 ENCOUNTER — Other Ambulatory Visit: Payer: Self-pay | Admitting: Physician Assistant

## 2023-03-12 ENCOUNTER — Other Ambulatory Visit: Payer: Self-pay | Admitting: Physician Assistant

## 2023-03-12 NOTE — Telephone Encounter (Signed)
Next visit is 03/29/23. Tea is requesting refill on Alprazolam 0.5 mg called to:  [facility] - [place], [place] - [address]   Phone: [phone]  Fax: [phone]    She is totally out of it now.

## 2023-03-14 ENCOUNTER — Ambulatory Visit (INDEPENDENT_AMBULATORY_CARE_PROVIDER_SITE_OTHER): Payer: BC Managed Care – PPO

## 2023-03-14 VITALS — BP 121/82 | HR 78 | Temp 98.0°F | Resp 16 | Ht 60.0 in | Wt 116.4 lb

## 2023-03-14 DIAGNOSIS — K5100 Ulcerative (chronic) pancolitis without complications: Secondary | ICD-10-CM

## 2023-03-14 MED ORDER — VEDOLIZUMAB 300 MG IV SOLR
300.0000 mg | Freq: Once | INTRAVENOUS | Status: AC
  Administered 2023-03-14: 300 mg via INTRAVENOUS
  Filled 2023-03-14: qty 5

## 2023-03-14 NOTE — Progress Notes (Signed)
Diagnosis: Pancolitis   Provider:  Chilton Greathouse MD  Procedure: IV Infusion  IV Type: Peripheral, IV Location: L Antecubital  Entyvio (Vedolizumab), Dose: 300 mg  Infusion Start Time: 1427  Infusion Stop Time: 1503  Post Infusion IV Care: Patient declined observation. PIV removed.  Discharge: Condition: Good, Destination: Home . AVS Provided  Performed by:  Rico Ala, LPN

## 2023-03-18 ENCOUNTER — Ambulatory Visit (INDEPENDENT_AMBULATORY_CARE_PROVIDER_SITE_OTHER): Payer: BC Managed Care – PPO | Admitting: Physician Assistant

## 2023-03-18 VITALS — BP 127/78 | HR 108 | Ht 60.0 in | Wt 116.8 lb

## 2023-03-18 DIAGNOSIS — I5032 Chronic diastolic (congestive) heart failure: Secondary | ICD-10-CM | POA: Diagnosis not present

## 2023-03-18 DIAGNOSIS — E119 Type 2 diabetes mellitus without complications: Secondary | ICD-10-CM | POA: Diagnosis not present

## 2023-03-18 DIAGNOSIS — Z7984 Long term (current) use of oral hypoglycemic drugs: Secondary | ICD-10-CM | POA: Diagnosis not present

## 2023-03-18 NOTE — Progress Notes (Signed)
Acute Office Visit   Patient: Amy Hansen   DOB: [birth year]   67 y.o. Female  MRN: [id number] Visit Date: 03/18/2023  Today's healthcare provider: Oswaldo Conroy Kiah Keay, PA-C  Introduced myself to the patient as a Secondary school teacher and provided education on APPs in clinical practice.    Chief Complaint  Patient presents with   Diabetes   Foot Swelling    Patient says she is having swelling in both of her feet. Patient says she has never have any redness with the swelling before and she is concerned about that since she is a Diabetic. Patient says when she flex it is tight and it is sore.    Subjective    HPI HPI     Foot Swelling    Additional comments: Patient says she is having swelling in both of her feet. Patient says she has never have any redness with the swelling before and she is concerned about that since she is a Diabetic. Patient says when she flex it is tight and it is sore.       Last edited by Malen Gauze, CMA on 03/18/2023  1:23 PM.       Diabetes  A1c was 6.5 in July 2024  She was started on Januvia 100 mg  PO every day about a month ago  She checked her glucose this AM after breakfast, was 165  She denies side effects at this time- reports she does have some nausea at times but has not been throwing up lately   She reports bilateral lower extremity edema  She states she has had this ongoing since she came out of the hospital She has taken 2 of her Lasix this AM - she has an apt with with Cardiology in Sept She states that she does sometimes have swelling over the level of her knee and is concerned about this   Medications: Outpatient Medications Prior to Visit  Medication Sig   ALPRAZolam (XANAX) 0.5 MG tablet Take 1 tablet (0.5 mg total) by mouth 2 (two) times daily as neededfor anxiety.   aspirin EC 81 MG tablet Take 1 tablet (81 mg) by mouth once daily. Swallow whole.   cephALEXin (KEFLEX) 250 MG capsule Take 250 mg by mouth daily.   estradiol (ESTRACE)  0.1 MG/GM vaginal cream Estrogen Cream Instruction  Discard applicator  Apply pea sized amount to tip of finger to urethra before bed. Wash hands well after application. Use Monday, Wednesday and Friday   famotidine (PEPCID) 20 MG tablet Take 1 tablet (20 mg total) by mouth 2 (two) times daily.   furosemide (LASIX) 20 MG tablet Take 1 tablet (20 mg total) by mouth daily. May take an additional tablet (20mg ) AS NEEDED for weight gain and/or swelling.   metoprolol tartrate (LOPRESSOR) 25 MG tablet Take 1 tablet (25 mg total) by mouth 2 (two) times daily.   ondansetron (ZOFRAN-ODT) 8 MG disintegrating tablet Take 1 tablet (8 mg total) by mouth every 8 (eight) hours as needed for nausea or vomiting.   potassium chloride (KLOR-CON M) 10 MEQ tablet Take 1 tablet (10 mEq total) by mouth daily.   pramipexole (MIRAPEX) 1 MG tablet Take 1 tablet (1 mg total) by mouth at bedtime.   QUEtiapine (SEROQUEL) 100 MG tablet Take 100 mg by mouth at bedtime.   Rimegepant Sulfate 75 MG TBDP Take 75 mg as needed for headache rescue   rosuvastatin (CRESTOR) 5 MG tablet Take 1 tablet  by mouth once daily   sertraline (ZOLOFT) 25 MG tablet Take 1 tablet (25 mg total) by mouth daily.   sitaGLIPtin (JANUVIA) 100 MG tablet Take 1 tablet (100 mg total) by mouth daily.   vedolizumab (ENTYVIO) 300 MG injection Inject into the vein. Every 4 weeks   doxycycline (VIBRAMYCIN) 100 MG capsule Take 1 capsule (100 mg total) by mouth 2 (two) times daily. (Patient not taking: Reported on 03/18/2023)   fluconazole (DIFLUCAN) 100 MG tablet Take once daily for 3 days (Patient not taking: Reported on 03/18/2023)   potassium chloride (KLOR-CON) 10 MEQ tablet Take 10 mEq by mouth daily. (Patient not taking: Reported on 03/18/2023)   PREMARIN vaginal cream SMARTSIG:1 Vaginal Daily (Patient not taking: Reported on 03/18/2023)   promethazine (PHENERGAN) 25 MG tablet Take 1 tablet (25 mg total) by mouth every 8 (eight) hours as needed for nausea or  vomiting. (Patient not taking: Reported on 03/18/2023)   No facility-administered medications prior to visit.    Review of Systems  Cardiovascular:  Positive for leg swelling. Negative for chest pain and palpitations.  Gastrointestinal:  Positive for nausea. Negative for diarrhea and vomiting.  Neurological:  Negative for dizziness, facial asymmetry and light-headedness.        Objective    BP 127/78   Pulse (!) 108   Ht 5' (1.524 m)   Wt 116 lb 12.8 oz (53 kg)   LMP  (LMP Unknown)   SpO2 98%   BMI 22.81 kg/m     Physical Exam Vitals reviewed.  Constitutional:      General: She is awake.     Appearance: Normal appearance. She is well-developed and well-groomed.  HENT:     Head: Normocephalic and atraumatic.  Cardiovascular:     Rate and Rhythm: Normal rate and regular rhythm.     Pulses:          Radial pulses are 2+ on the right side and 2+ on the left side.       Dorsalis pedis pulses are 1+ on the right side and 1+ on the left side.     Heart sounds: Normal heart sounds. No murmur heard.    No friction rub. No gallop.  Pulmonary:     Effort: Pulmonary effort is normal.     Breath sounds: Normal breath sounds. No decreased air movement. No decreased breath sounds, wheezing, rhonchi or rales.  Musculoskeletal:     Right lower leg: 1+ Pitting Edema present.     Left lower leg: 1+ Pitting Edema present.  Neurological:     Mental Status: She is alert.  Psychiatric:        Attention and Perception: Attention and perception normal.        Mood and Affect: Mood and affect normal.        Speech: Speech normal.        Behavior: Behavior normal. Behavior is cooperative.       No results found for any visits on 03/18/23.  Assessment & Plan      Return in about 3 months (around 06/18/2023) for DM, HTN, HLD.     Problem List Items Addressed This Visit       Cardiovascular and Mediastinum   Chronic heart failure with preserved ejection fraction (HCC)     Chronic, historic condition She reports increased swelling in her lower extremities that is not improving despite taking 2 Lasix 20 mg PO tablets today  She has follow up with Cardiology in Sept  for this  Recommend wearing compression stockings and continuing with Lasix as directed to manage until she can be seen by Cariology for follow up       Relevant Orders   Comp Met (CMET)   CBC w/Diff     Endocrine   Diabetes mellitus type 2 in nonobese (HCC) - Primary (Chronic)    Chronic, historic condition She is checking glucose levels at home irregularly- This AM postprandial was 165 She has been taking Januvia 100 mg PO every day and appears to be tolerating well- continue current regimen Will recheck A1c in about 2-3 months for monitoring Follow up in 3 months or sooner if concerns arise.        Relevant Orders   Comp Met (CMET)   CBC w/Diff     Return in about 3 months (around 06/18/2023) for DM, HTN, HLD.   I, Tevis Dunavan E Kristie Bracewell, PA-C, have reviewed all documentation for this visit. The documentation on 03/18/23 for the exam, diagnosis, procedures, and orders are all accurate and complete.   Jacquelin Hawking, MHS, PA-C [facility]

## 2023-03-18 NOTE — Progress Notes (Unsigned)
Cardiology Office Note    Date:  03/19/2023   ID:  Amy Hansen, Amy Hansen Sep 10, 1955, MRN [id number]  PCP:  [name], NP  Cardiologist:  Yvonne Kendall, MD  Electrophysiologist:  None   Chief Complaint: Follow up  History of Present Illness:   Amy Hansen is a 67 y.o. female with history of HFpEF, atypical chest pain, PSVT, CVA/TIA, DM2, anemia, indeterminate colitis, elevated LFTs, RLS, hiatal hernia, anxiety, depression, and GERD who presents for evaluation of pedal and ankle edema.   She was previously followed by Dr. Alvino Chapel with echo in 07/2016 showing an EF of 55 to 60%, no regional wall motion abnormalities, grade 1 diastolic dysfunction, mild mitral regurgitation, and a mildly dilated left atrium.  Treadmill MPI in 08/2016 showed no significant ischemia with adequate exercise tolerance, and was overall low risk.  Zio patch in 07/2019 showed a predominant rhythm of sinus with an average rate of 101 bpm, a single episode of NSVT lasting 9 beats, 20 episodes of SVT lasting up to 11 beats, and rare PACs/PVCs.  Patient triggered events corresponded to sinus rhythm with PACs.  Echo from 03/2021 demonstrated an EF of 60 to 65%, no regional wall motion abnormalities, grade 1 diastolic dysfunction, normal RV systolic function and ventricular cavity size, mild aortic valve sclerosis without evidence of stenosis, and an estimated right atrial pressure of 3 mmHg.      Prior MRI of the brain in 2022 demonstrated small chronic cortical/subcortical infarct within the left parietal lobe as well as a small chronic lacunar infarcts within the bilateral basal ganglia and changes consistent with chronic small vessel ischemia.     She was seen in the office in 05/2022 and was without symptoms of angina or decompensation.  She did report a 1 week history of exertional shortness of breath when going up or down a flight of stairs.  She also reported an episode during the summer where she woke up on the  floor after a lamp fell on her.  At that time, CT of the head and MRI of the face showed no acute process.  It was unclear if this was associated with a syncopal episode or not.  There was no cardiac pro or postdrome.  Zio patch in 05/2022 showed a predominant rhythm of sinus with an average rate of 103 bpm (range 74 to 150 bpm in sinus), single episode of NSVT occurred lasting 4 beats with a maximum rate of 222 bpm, 11 episodes of SVT lasting up to 7 beats with a maximum rate of 148 bpm, rare PACs and PVCs, and no sustained arrhythmias or prolonged pauses.  Patient triggered events corresponded to sinus rhythm and blocked PAC.   She was seen at outside ED on 07/16/2022 with intermittent confusion and difficulty saying specific words.  CT of the head showed no acute intracranial abnormality.  High-sensitivity troponin negative.  She followed up with PCP and was found to have UTI.  Subsequent MRI of the brain in 09/2022 showed no evidence of acute intracranial abnormality with redemonstrated tiny chronic cortical infarct within the left parietal lobe, redemonstrated tiny chronic lacunar infarct within the right thalamus, and similar appearance of likely chronic small vessel ischemia.   Echo on 07/24/2022 demonstrated an EF of 50 to 55%, no regional wall motion abnormalities, mild LVH, grade 1 diastolic dysfunction, normal RV systolic function and ventricular cavity size, mildly dilated left atrium, and trivial mitral regurgitation.  She was last seen in the office in 07/2022  and was without symptoms of angina or cardiac decompensation.  She continued to report episodes of transient confusion associated with dysarthria without weakness.  CTA head and neck in 11/2022 showed no acute intracranial finding with chronic small vessel ischemic changes.  No intracranial large vessel occlusion or proximal stenosis.  No suspected significant carotid artery stenosis with aortic atherosclerosis noted.  She was admitted to  the hospital in 01/2023 with flank pain found to have pan positive review of systems.  She was treated for AKI presumed to be secondary to dehydration in the setting of dysphagia with nausea and vomiting.  Found to have large amount of stool burden on CT which was likely contributing to her nausea and vomiting.  GI was consulted with very low suspicion for underlying organic GI disorder with symptoms felt to be related to medication side effects and anxiety.  From a CHF perspective, she appeared compensated.  High-sensitivity troponin trended to 51.  She was evaluated by her PCP yesterday and reported increased bilateral pedal and ankle swelling despite taking 40 mg of Lasix.  She comes in today noting intermittent bilateral pedal and ankle swelling that has been present since her hospital admission last month.  Swelling went wax and wane.  However, swelling became significantly more pronounced 2 to 3 days ago.  Without progressive dyspnea, abdominal distention, orthopnea.  No chest pain or palpitations.  Over the past 2 to 3 weeks she has been taking furosemide 40 mg daily with stable renal function noted on labs yesterday.  Throughout the day today she has been sitting with her legs elevated and notes a significant improvement in bilateral pedal and ankle swelling, though she does continue to note some swelling along the ankles.  No surrounding erythema or cording.  Her weight is down 22 pounds by our scale today when compared to her visit in 07/2022.  Has not been taking Lopressor over the past several days with noted stable blood pressure, prefers to avoid this medication moving forward.  She does occasionally add salt to food.   Labs independently reviewed: 02/2023 - Hgb 11.4, PLT 316, potassium 3.6, BUN 9, serum creatinine 0.6, albumin 3.3, AST 144, ALT 112 01/2023 - magnesium 2.1, TC 112, TG 192, HDL 44, LDL 37, A1c 6.5 11/2022 - TSH normal  Past Medical History:  Diagnosis Date   Anemia    Anxiety     Arthritis    Blood transfusion without reported diagnosis    Cataract    CHF (congestive heart failure) (HCC)    Chronic kidney disease    UTI, hematuria in urine   Colitis    Crohn's disease (HCC)    Depression    Diabetes (HCC)    Diverticulosis    Frequent headaches    Interstitial cystitis    Recurrent UTI    Restless leg syndrome    TIA (transient ischemic attack) 02/20/2021   Urinary frequency     Past Surgical History:  Procedure Laterality Date   bariatric bypass  2012   BIOPSY  05/03/2020   Procedure: BIOPSY;  Surgeon: Rachael Fee, MD;  Location: [facility] ENDOSCOPY;  Service: Endoscopy;;   CARPAL TUNNEL RELEASE Right 2003   CARPAL TUNNEL RELEASE Right    2008   CHOLECYSTECTOMY  1975   COLONOSCOPY     COLONOSCOPY WITH PROPOFOL N/A 05/03/2020   Procedure: COLONOSCOPY WITH PROPOFOL;  Surgeon: Rachael Fee, MD;  Location: [facility] ENDOSCOPY;  Service: Endoscopy;  Laterality: N/A;   CYSTOSCOPY W/ RETROGRADES  Bilateral 06/06/2015   Procedure: CYSTOSCOPY WITH RETROGRADE PYELOGRAM;  Surgeon: Jerilee Field, MD;  Location: [facility] ORS;  Service: Urology;  Laterality: Bilateral;   EYE SURGERY Left 07/06/2022   FL INJ LEFT KNEE CT ARTHROGRAM ([facility] HX) Left    1995   GASTRIC BYPASS  2010   HAND SURGERY Left 01/19/2021   Thumb   HEMORRHOID SURGERY  2013   KNEE ARTHROSCOPY Left 1996   TONSILLECTOMY     TOTAL ABDOMINAL HYSTERECTOMY W/ BILATERAL SALPINGOOPHORECTOMY      Current Medications: Current Meds  Medication Sig   ALPRAZolam (XANAX) 0.5 MG tablet Take 1 tablet (0.5 mg total) by mouth 2 (two) times daily as neededfor anxiety.   aspirin EC 81 MG tablet Take 1 tablet (81 mg) by mouth once daily. Swallow whole.   cephALEXin (KEFLEX) 250 MG capsule Take 250 mg by mouth daily.   estradiol (ESTRACE) 0.1 MG/GM vaginal cream Estrogen Cream Instruction  Discard applicator  Apply pea sized amount to tip of finger to urethra before bed. Wash hands well after  application. Use Monday, Wednesday and Friday   famotidine (PEPCID) 20 MG tablet Take 1 tablet (20 mg total) by mouth 2 (two) times daily.   furosemide (LASIX) 20 MG tablet Take 1 tablet (20 mg total) by mouth 2 (two) times daily.   ondansetron (ZOFRAN-ODT) 8 MG disintegrating tablet Take 1 tablet (8 mg total) by mouth every 8 (eight) hours as needed for nausea or vomiting.   potassium chloride (KLOR-CON M) 10 MEQ tablet Take 1 tablet (10 mEq total) by mouth daily.   pramipexole (MIRAPEX) 1 MG tablet Take 1 tablet (1 mg total) by mouth at bedtime.   QUEtiapine (SEROQUEL) 100 MG tablet Take 100 mg by mouth at bedtime.   rosuvastatin (CRESTOR) 5 MG tablet Take 1 tablet by mouth once daily   sertraline (ZOLOFT) 25 MG tablet Take 1 tablet (25 mg total) by mouth daily.   sitaGLIPtin (JANUVIA) 100 MG tablet Take 1 tablet (100 mg total) by mouth daily.   vedolizumab (ENTYVIO) 300 MG injection Inject into the vein. Every 4 weeks   [DISCONTINUED] furosemide (LASIX) 20 MG tablet Take 1 tablet (20 mg total) by mouth daily. May take an additional tablet (20mg ) AS NEEDED for weight gain and/or swelling.    Allergies:   Avelox [moxifloxacin hcl in nacl], Bactrim [sulfamethoxazole-trimethoprim], Ciprofloxacin, Buspar [buspirone], Neomycin-polymyxin-dexameth, Aquaphor [lanolin-petrolatum], Depakote [divalproex sodium], Imitrex [sumatriptan], and Stadol [butorphanol]   Social History   Socioeconomic History   Marital status: Married    Spouse name: Not on file   Number of children: Not on file   Years of education: Not on file   Highest education level: Not on file  Occupational History   Not on file  Tobacco Use   Smoking status: Former    Current packs/day: 0.00    Types: Cigarettes    Quit date: 04/25/1975    Years since quitting: 47.9   Smokeless tobacco: Never   Tobacco comments:    quit 40 years ago  Vaping Use   Vaping status: Never Used  Substance and Sexual Activity   Alcohol use: No     Alcohol/week: 0.0 standard drinks of alcohol   Drug use: No   Sexual activity: Not Currently    Birth control/protection: Post-menopausal, Surgical  Other Topics Concern   Not on file  Social History Narrative   Caffeine 5 servings per day.   Social Determinants of Health   Financial Resource Strain: Medium Risk (04/12/2021)  Overall Financial Resource Strain (CARDIA)    Difficulty of Paying Living Expenses: Somewhat hard  Food Insecurity: No Food Insecurity (02/13/2023)   Hunger Vital Sign    Worried About Running Out of Food in the Last Year: Never true    Ran Out of Food in the Last Year: Never true  Transportation Needs: No Transportation Needs (02/13/2023)   PRAPARE - Administrator, Civil Service (Medical): No    Lack of Transportation (Non-Medical): No  Physical Activity: Inactive (04/12/2021)   Exercise Vital Sign    Days of Exercise per Week: 0 days    Minutes of Exercise per Session: 0 min  Stress: No Stress Concern Present (03/06/2022)   [employer] of Occupational Health - Occupational Stress Questionnaire    Feeling of Stress : Only a little  Social Connections: Moderately Integrated (03/06/2022)   Social Connection and Isolation Panel [NHANES]    Frequency of Communication with Friends and Family: More than three times a week    Frequency of Social Gatherings with Friends and Family: More than three times a week    Attends Religious Services: More than 4 times per year    Active Member of [employer] or Organizations: No    Attends Banker Meetings: Never    Marital Status: Married     Family History:  The patient's family history includes Colon cancer in her mother; Heart failure in her sister; Stroke in her father. There is no history of Bladder Cancer, Kidney disease, Prostate cancer, Kidney cancer, Pancreatic cancer, Esophageal cancer, Stomach cancer, Rectal cancer, or Breast cancer.  ROS:   Review of Systems  Constitutional:  Positive  for chills, malaise/fatigue and weight loss. Negative for diaphoresis and fever.  HENT:  Negative for congestion.   Eyes:  Negative for discharge and redness.  Respiratory:  Positive for shortness of breath. Negative for cough, hemoptysis, sputum production and wheezing.   Cardiovascular:  Positive for chest pain, palpitations and leg swelling. Negative for orthopnea, claudication and PND.  Gastrointestinal:  Positive for abdominal pain, constipation, nausea and vomiting. Negative for blood in stool, heartburn and melena.  Genitourinary:  Negative for dysuria, frequency, hematuria and urgency.  Musculoskeletal:  Positive for back pain and falls. Negative for myalgias.  Skin:  Negative for rash.  Neurological:  Positive for dizziness, tingling, sensory change, weakness and headaches. Negative for tremors, speech change, focal weakness and loss of consciousness.  Endo/Heme/Allergies:  Does not bruise/bleed easily.  Psychiatric/Behavioral:  Negative for substance abuse. The patient is nervous/anxious.   All other systems reviewed and are negative.     EKGs/Labs/Other Studies Reviewed:    Studies reviewed were summarized above. The additional studies were reviewed today:  2D echo 07/24/2022: 1. Left ventricular ejection fraction, by estimation, is 50 to 55%. Left  ventricular ejection fraction by 2D MOD biplane is 53.8 %. The left  ventricle has low normal function. The left ventricle has no regional wall  motion abnormalities. There is mild left ventricular hypertrophy. Left ventricular diastolic parameters are consistent with Grade I diastolic dysfunction (impaired relaxation). The average left ventricular global longitudinal strain is -16.8 %.   2. Right ventricular systolic function is normal. The right ventricular size is normal.   3. Left atrial size was mildly dilated.   4. The mitral valve is normal in structure. Trivial mitral valve regurgitation.   5. The aortic valve is tricuspid.  Aortic valve regurgitation is not visualized. __________    Luci Bank patch 05/2022:  The patient was monitored for 13 days, 23 hours.   The predominant rhythm was sinus with an average rate of 103 bpm (range 74-150 bpm in sinus).   There were rare PAC's and PVC's.   A single episode of nonsustained ventricular tachycardia occurred, lasting 4 beats with a maximum rate of 222 bpm.   There were 11 supraventricular runs, lasting up to 7 beats with a maximum rate of 148 bpm.   No sustained arrhythmia or prolonged pause was observed.   Patient triggered events correspond to sinus rhythm and blocked PAC.   Predominantly sinus rhythm with elevated average heart rate.  Rare PAC's and PVC's were observed, as well as a few brief episodes of NSVT and PSVT, as detailed above. __________   2D echo 04/20/2021: 1. Left ventricular ejection fraction, by estimation, is 60 to 65%. Left  ventricular ejection fraction by 2D MOD biplane is 64.3 %. The left  ventricle has normal function. The left ventricle has no regional wall  motion abnormalities. Left ventricular  diastolic parameters are consistent with Grade I diastolic dysfunction  (impaired relaxation).   2. Right ventricular systolic function is normal. The right ventricular  size is normal.   3. The mitral valve is grossly normal. No evidence of mitral valve  regurgitation.   4. The aortic valve is tricuspid. Aortic valve regurgitation is not  visualized. Mild aortic valve sclerosis is present, with no evidence of  aortic valve stenosis.   5. The inferior vena cava is normal in size with greater than 50%  respiratory variability, suggesting right atrial pressure of 3 mmHg.   Comparison(s): LVEF 55-60%.  __________   Luci Bank patch 07/2019: The patient was monitored for 12 days, 14 hours. The predominant rhythm was sinus with an average rate of 101 bpm (range 66 to 150 bpm in sinus). Rare PACs and PVCs were noted. A single episode of nonsustained  ventricular tachycardia lasting nine beats occurred, with a maximum rate of 152 bpm. There were 20 episodes of supraventricular tachycardia lasting up to 11 beats with a maximal rate of 176 bpm. No sustained arrhythmia or prolonged pause was observed. Patient triggered events correspond to sinus rhythm with PACs.   Predominantly sinus rhythm with rare PACs and PVCs as well as brief PSVT and NSVT.  Patient triggered events correspond to sinus rhythm with PACs. __________   Treadmill MPI 08/30/2016: Exercise  myocardial perfusion imaging study with no significant  ischemia Normal wall motion, EF estimated at 50% No EKG changes concerning for ischemia at peak stress or in recovery. Target heart rate achieved Adequate exercise tolerance, exercised for 5:50 min Low risk scan __________   2D echo 08/23/2016: - Left ventricle: The cavity size was normal. Wall thickness was    normal. Systolic function was normal. The estimated ejection    fraction was in the range of 55% to 60%. Wall motion was normal;    there were no regional wall motion abnormalities. Doppler    parameters are consistent with abnormal left ventricular    relaxation (grade 1 diastolic dysfunction).  - Mitral valve: There was mild regurgitation.  - Left atrium: The atrium was mildly dilated.    EKG:  EKG is ordered today.  The EKG ordered today demonstrates sinus tachycardia, 109 bpm, nonspecific inferior st/t changes  Recent Labs: 02/10/2023: Magnesium 2.1 03/18/2023: ALT 112; BUN 9; Creatinine, Ser 0.60; Hemoglobin 11.4; Platelets 316; Potassium 3.6; Sodium 138  Recent Lipid Panel    Component Value Date/Time  CHOL 112 02/04/2023 1545   CHOL 139 07/17/2016 1408   CHOL 148 01/10/2012 0717   TRIG 192 (H) 02/04/2023 1545   TRIG 99 07/17/2016 1408   TRIG 110 01/10/2012 0717   HDL 44 02/04/2023 1545   HDL 47 01/10/2012 0717   CHOLHDL 3.0 05/01/2022 0943   CHOLHDL 2.0 04/26/2020 0339   VLDL 13 04/26/2020 0339   VLDL  20 07/17/2016 1408   VLDL 22 01/10/2012 0717   LDLCALC 37 02/04/2023 1545   LDLCALC 79 01/10/2012 0717    PHYSICAL EXAM:    VS:  BP 130/78 (BP Location: Left Arm, Patient Position: Sitting, Cuff Size: Small)   Pulse (!) 109   Wt 110 lb 6.4 oz (50.1 kg)   LMP  (LMP Unknown)   SpO2 97%   BMI 21.56 kg/m   BMI: Body mass index is 21.56 kg/m.  Physical Exam Constitutional:      Appearance: She is well-developed.  HENT:     Head: Normocephalic and atraumatic.  Eyes:     General:        Right eye: No discharge.        Left eye: No discharge.  Neck:     Vascular: No JVD.  Cardiovascular:     Rate and Rhythm: Regular rhythm. Tachycardia present.     Heart sounds: Normal heart sounds, S1 normal and S2 normal. Heart sounds not distant. No midsystolic click and no opening snap. No murmur heard.    No friction rub.  Pulmonary:     Effort: Pulmonary effort is normal. No respiratory distress.     Breath sounds: Normal breath sounds. No decreased breath sounds, wheezing or rales.  Chest:     Chest wall: No tenderness.  Abdominal:     General: There is no distension.     Palpations: Abdomen is soft.     Tenderness: There is no abdominal tenderness.  Musculoskeletal:     Cervical back: Normal range of motion.     Right lower leg: Edema present.     Left lower leg: Edema present.     Comments: 1+ bilateral ankle edema.  Skin:    General: Skin is warm and dry.     Nails: There is no clubbing.  Neurological:     Mental Status: She is alert and oriented to person, place, and time.  Psychiatric:        Speech: Speech normal.        Behavior: Behavior normal.        Thought Content: Thought content normal.        Judgment: Judgment normal.     Wt Readings from Last 3 Encounters:  03/19/23 110 lb 6.4 oz (50.1 kg)  03/18/23 116 lb 12.8 oz (53 kg)  03/14/23 116 lb 6.4 oz (52.8 kg)     ASSESSMENT & PLAN:   HFpEF/lower extremity swelling: She reports intermittent bilateral pedal  and ankle swelling since her hospital admission in 2023-02-24 that became more pronounced over the past 2 to 3 days and has significantly improved with leg elevation.  No evidence of significant hypoalbuminemia or symptomatic anemia on labs obtained yesterday.  Obtain echo to evaluate for new cardiomyopathy and to evaluate RVSP/RV systolic function given prior noted abnormalities.  For now, take furosemide 20 mg twice daily with a follow-up BMP in 1 week.  Not on calcium channel blocker.  History of elevated troponin: Never with symptoms of angina.  Obtain echo as outlined above to start with recommendation  for ischemic testing based on results.  She remains on aspirin and rosuvastatin.  PSVT: Quiescent.  Prefers to avoid metoprolol.  History of CVA/TIA: Followed by neurology.  Remains on aspirin and statin.  Prior cardiac monitoring has not demonstrated evidence of sustained arrhythmia.  Elevated LFTs: Followed by GI.  Plan for echo as outlined above to evaluate for RV dysfunction/new cardiomyopathy.   Disposition: F/u with Dr. Okey Dupre or an APP in 1 month.   Medication Adjustments/Labs and Tests Ordered: Current medicines are reviewed at length with the patient today.  Concerns regarding medicines are outlined above. Medication changes, Labs and Tests ordered today are summarized above and listed in the Patient Instructions accessible in Encounters.   Signed, Eula Listen, PA-C 03/19/2023 4:57 PM     [facility] [place] [address] [place], [place] [address] [phone]

## 2023-03-18 NOTE — Assessment & Plan Note (Signed)
>>  ASSESSMENT AND PLAN FOR DIABETES MELLITUS TYPE 2 IN NONOBESE (HCC) WRITTEN ON 03/18/2023  1:46 PM BY MECUM, ERIN E, PA-C  Chronic, historic condition She is checking glucose levels at home irregularly- This AM postprandial was 165 She has been taking Januvia 100 mg PO every day and appears to be tolerating well- continue current regimen Will recheck A1c in about 2-3 months for monitoring Follow up in 3 months or sooner if concerns arise.

## 2023-03-18 NOTE — Assessment & Plan Note (Signed)
Chronic, historic condition She reports increased swelling in her lower extremities that is not improving despite taking 2 Lasix 20 mg PO tablets today  She has follow up with Cardiology in Sept for this  Recommend wearing compression stockings and continuing with Lasix as directed to manage until she can be seen by Cariology for follow up

## 2023-03-18 NOTE — Assessment & Plan Note (Signed)
Chronic, historic condition She is checking glucose levels at home irregularly- This AM postprandial was 165 She has been taking Januvia 100 mg PO every day and appears to be tolerating well- continue current regimen Will recheck A1c in about 2-3 months for monitoring Follow up in 3 months or sooner if concerns arise.

## 2023-03-19 ENCOUNTER — Ambulatory Visit: Payer: BC Managed Care – PPO | Attending: Physician Assistant | Admitting: Physician Assistant

## 2023-03-19 ENCOUNTER — Telehealth: Payer: Self-pay | Admitting: Gastroenterology

## 2023-03-19 ENCOUNTER — Encounter: Payer: Self-pay | Admitting: Physician Assistant

## 2023-03-19 VITALS — BP 130/78 | HR 109 | Wt 110.4 lb

## 2023-03-19 DIAGNOSIS — Z8673 Personal history of transient ischemic attack (TIA), and cerebral infarction without residual deficits: Secondary | ICD-10-CM

## 2023-03-19 DIAGNOSIS — M7989 Other specified soft tissue disorders: Secondary | ICD-10-CM

## 2023-03-19 DIAGNOSIS — R7989 Other specified abnormal findings of blood chemistry: Secondary | ICD-10-CM

## 2023-03-19 DIAGNOSIS — I471 Supraventricular tachycardia, unspecified: Secondary | ICD-10-CM | POA: Diagnosis not present

## 2023-03-19 DIAGNOSIS — I5032 Chronic diastolic (congestive) heart failure: Secondary | ICD-10-CM

## 2023-03-19 LAB — COMPREHENSIVE METABOLIC PANEL
ALT: 112 IU/L — ABNORMAL HIGH (ref 0–32)
AST: 144 IU/L — ABNORMAL HIGH (ref 0–40)
Albumin: 3.3 g/dL — ABNORMAL LOW (ref 3.9–4.9)
Alkaline Phosphatase: 176 IU/L — ABNORMAL HIGH (ref 44–121)
BUN/Creatinine Ratio: 15 (ref 12–28)
BUN: 9 mg/dL (ref 8–27)
Bilirubin Total: 0.2 mg/dL (ref 0.0–1.2)
CO2: 23 mmol/L (ref 20–29)
Calcium: 8.5 mg/dL — ABNORMAL LOW (ref 8.7–10.3)
Chloride: 100 mmol/L (ref 96–106)
Creatinine, Ser: 0.6 mg/dL (ref 0.57–1.00)
Globulin, Total: 2.3 g/dL (ref 1.5–4.5)
Glucose: 160 mg/dL — ABNORMAL HIGH (ref 70–99)
Potassium: 3.6 mmol/L (ref 3.5–5.2)
Sodium: 138 mmol/L (ref 134–144)
Total Protein: 5.6 g/dL — ABNORMAL LOW (ref 6.0–8.5)
eGFR: 100 mL/min/{1.73_m2} (ref >59)

## 2023-03-19 LAB — CBC WITH DIFFERENTIAL/PLATELET
Basophils Absolute: 0 10*3/uL (ref 0.0–0.2)
Basos: 0 %
EOS (ABSOLUTE): 0 10*3/uL (ref 0.0–0.4)
Eos: 0 %
Hematocrit: 35.7 % (ref 34.0–46.6)
Hemoglobin: 11.4 g/dL (ref 11.1–15.9)
Immature Grans (Abs): 0 10*3/uL (ref 0.0–0.1)
Immature Granulocytes: 0 %
Lymphocytes Absolute: 1.4 10*3/uL (ref 0.7–3.1)
Lymphs: 35 %
MCH: 28.4 pg (ref 26.6–33.0)
MCHC: 31.9 g/dL (ref 31.5–35.7)
MCV: 89 fL (ref 79–97)
Monocytes Absolute: 0.3 10*3/uL (ref 0.1–0.9)
Monocytes: 7 %
Neutrophils Absolute: 2.3 10*3/uL (ref 1.4–7.0)
Neutrophils: 58 %
Platelets: 316 10*3/uL (ref 150–450)
RBC: 4.01 x10E6/uL (ref 3.77–5.28)
RDW: 14.8 % (ref 11.7–15.4)
WBC: 4 10*3/uL (ref 3.4–10.8)

## 2023-03-19 MED ORDER — FUROSEMIDE 20 MG PO TABS: 20.0000 mg | ORAL_TABLET | Freq: Two times a day (BID) | ORAL | 3 refills | Status: DC

## 2023-03-19 NOTE — Patient Instructions (Signed)
Medication Instructions:  Your physician recommends the following medication changes.  STOP TAKING: Metoprolol  INCREASE: Lasix 20 mg twice daily   *If you need a refill on your cardiac medications before your next appointment, please call your pharmacy*   Lab Work: Your provider would like for you to return in 1 week to have the following labs drawn: BMP.   Please go to [facility] [address] ([facility] [address] [facility]) [address], [place] [address] You do not need an appointment.  They are open from 7:30 am-4 pm.  Lunch from 1:00 pm- 2:00 pm You don't need to be fasting.   You may also go to any of these [facility] locations:  [employer]  - [address] [employer] [address] Chief Technology Officer)     If you have labs (blood work) drawn today and your tests are completely normal, you will receive your results only by: [employer] (if you have MyChart) OR A paper copy in the mail If you have any lab test that is abnormal or we need to change your treatment, we will call you to review the results.   Testing/Procedures: Your physician has requested that you have an echocardiogram. Echocardiography is a painless test that uses sound waves to create images of your heart. It provides your doctor with information about the size and shape of your heart and how well your heart's chambers and valves are working.   You may receive an ultrasound enhancing agent through an IV if needed to better visualize your heart during the echo. This procedure takes approximately one hour.  There are no restrictions for this procedure.  This will take place at [address] [facility] ([facility] [address], [place] [address]    Follow-Up: At [facility], you and your health needs are our priority.  As part of our continuing mission to provide you with exceptional heart care, we have created designated Provider Care Teams.  These Care Teams include your primary Cardiologist  (physician) and Advanced Practice Providers (APPs -  Physician Assistants and Nurse Practitioners) who all work together to provide you with the care you need, when you need it.  We recommend signing up for the patient portal called "MyChart".  Sign up information is provided on this After Visit Summary.  MyChart is used to connect with patients for Virtual Visits (Telemedicine).  Patients are able to view lab/test results, encounter notes, upcoming appointments, etc.  Non-urgent messages can be sent to your provider as well.   To learn more about what you can do with MyChart, go to [email or web address].    Your next appointment:   1 month(s)  Provider:   Eula Listen, PA-C

## 2023-03-19 NOTE — Telephone Encounter (Signed)
Inbound call from patient states she had labs done with her PCP yesterday. And wanted to make provider and nurse aware  her results are in [place].

## 2023-03-19 NOTE — Telephone Encounter (Signed)
I reviewed them.  Her liver enzymes were elevated during her recent hospitalization, they seem slightly lower but certainly not normal.  We had discussed potentially doing a liver biopsy at some point.  I would like to have her LFTs repeated in 2 weeks to make sure stable.  Can you order and let her know?  Thanks

## 2023-03-20 NOTE — Telephone Encounter (Signed)
Called and spoke with patient regarding Dr. Lanetta Inch recommendations. Patient is aware that I will remind her about repeat labs in 2 weeks ( around 04/03/23). Pt verbalized understanding and had no concerns at the end of the call.   2-week lab reminder and order in epic.

## 2023-03-20 NOTE — Progress Notes (Signed)
Your CBC was normal- no signs of anemia at this time  Your potassium is in normal range- please continue your diet and medications Your calcium and protein were a bit low. I recommend trying to increase your intake of protein and calcium either through diet or supplements Your liver enzymes were elevated but appear to be improving since the last time they were checked We should continue to monitor this at your next apt

## 2023-03-27 ENCOUNTER — Other Ambulatory Visit: Payer: Self-pay | Admitting: Physician Assistant

## 2023-03-27 ENCOUNTER — Telehealth: Payer: Self-pay

## 2023-03-27 NOTE — Telephone Encounter (Signed)
MyChart message to patient to go to the lab next week. Order is in

## 2023-03-27 NOTE — Telephone Encounter (Signed)
-----   Message from [facility] Garwood H sent at 01/01/2023  4:41 PM EDT ----- Regarding: LFTs Due one day next week for LFTs

## 2023-03-28 ENCOUNTER — Telehealth: Payer: Self-pay | Admitting: Physician Assistant

## 2023-03-28 NOTE — Telephone Encounter (Signed)
Script approved at 10:44 today.

## 2023-03-28 NOTE — Telephone Encounter (Signed)
Amy Hansen has an appt tomorrow w/Teresa. She said she has no more Alprazolam for today. She took the last 1/2 pill today. She is requesting a refill on this sent to:  [facility] - [place], [place] - [address]   Phone: [phone]  Fax: [phone]

## 2023-03-29 ENCOUNTER — Ambulatory Visit (INDEPENDENT_AMBULATORY_CARE_PROVIDER_SITE_OTHER): Payer: Medicare Other | Admitting: Physician Assistant

## 2023-03-29 ENCOUNTER — Encounter: Payer: Self-pay | Admitting: Gastroenterology

## 2023-03-29 ENCOUNTER — Encounter: Payer: Self-pay | Admitting: Oncology

## 2023-03-29 ENCOUNTER — Encounter: Payer: Self-pay | Admitting: Physician Assistant

## 2023-03-29 DIAGNOSIS — F319 Bipolar disorder, unspecified: Secondary | ICD-10-CM | POA: Diagnosis not present

## 2023-03-29 DIAGNOSIS — F411 Generalized anxiety disorder: Secondary | ICD-10-CM

## 2023-03-29 DIAGNOSIS — G2581 Restless legs syndrome: Secondary | ICD-10-CM | POA: Diagnosis not present

## 2023-03-29 DIAGNOSIS — G4700 Insomnia, unspecified: Secondary | ICD-10-CM

## 2023-03-29 DIAGNOSIS — F1520 Other stimulant dependence, uncomplicated: Secondary | ICD-10-CM

## 2023-03-29 MED ORDER — TRAZODONE HCL 50 MG PO TABS
25.0000 mg | ORAL_TABLET | Freq: Every evening | ORAL | 1 refills | Status: DC | PRN
Start: 2023-03-29 — End: 2023-04-11

## 2023-03-29 NOTE — Telephone Encounter (Signed)
Pt informed, voiced understanding

## 2023-03-29 NOTE — Progress Notes (Signed)
[facility] Med Check  Patient ID: Amy Hansen,  MRN: [id number]  PCP: [name], NP  Date of Evaluation: 03/29/2023  Time spent:30 minutes  Chief Complaint:  Chief Complaint   Anxiety; Depression; Follow-up    HISTORY/CURRENT STATUS: For routine med check  Feels good mentally. Patient is able to enjoy things.  Energy and motivation are fair to good depending on how much she's slept the night before.  No extreme sadness, tearfulness, or feelings of hopelessness.  ADLs and personal hygiene are normal.   Her memory is stable although it is still not great.  Cannot remember what she went into a room for or remember what she was saying.  Seems to be the usual things that come along with age.  Appetite has not changed.  Weight is stable.  Denies suicidal or homicidal thoughts.  She has tolerated this lower dose of Xanax for approximately 1 month.  Our goal is to wean her completely off by the end of the year.  She still feels anxious, mostly jittery if someone touches her or if she hears a loud noise.  She is not having panic attacks though.  So overall the anxiety is controlled.  Trouble with sleep still. Might even be worse. Doesn't take naps. She falls asleep ok, but can't stay asleep. Goes to bed early, around 8 PM, but always has so it is no different.  She does admit to drinking caffeine all day, even up to bedtime when she takes her last doses of medication.  She might be getting 4 to 5 hours of sleep in 24 hours.  Broken up by several hours in the middle of the night.  Patient denies increased energy with decreased need for sleep, increased talkativeness, racing thoughts, impulsivity or risky behaviors, increased spending, increased libido, grandiosity, increased irritability or anger, paranoia, or hallucinations.  Denies dizziness, syncope, seizures, numbness, tingling, tremor, tics, unsteady gait, slurred speech, confusion. Denies muscle or joint pain, stiffness, or  dystonia. Denies unexplained weight loss, frequent infections, or sores that heal slowly.  No polyphagia, polydipsia, or polyuria. Denies visual changes or paresthesias.   Individual Medical History/ Review of Systems: Changes? :No        Past medications for mental health diagnoses include: Trazodone, Risperdal, Zoloft, Lunesta, prazosin, Sonata, Prozac, Depakote, Lamictal, lithium, Wellbutrin, Xanax, Ambien, carbamazepine, Seroquel, Buspar caused falls and hallucinations, Gabapentin-she doesn't want to take  Allergies: Avelox [moxifloxacin hcl in nacl], Bactrim [sulfamethoxazole-trimethoprim], Ciprofloxacin, Buspar [buspirone], Neomycin-polymyxin-dexameth, Aquaphor [lanolin-petrolatum], Depakote [divalproex sodium], Imitrex [sumatriptan], and Stadol [butorphanol]  Current Medications:  Current Outpatient Medications:    ALPRAZolam (XANAX) 0.5 MG tablet, Take 1 tablet (0.5 mg total) by mouth 2 (two) times daily as needed for anxiety., Disp: 30 tablet, Rfl: 0   aspirin EC 81 MG tablet, Take 1 tablet (81 mg) by mouth once daily. Swallow whole., Disp: , Rfl:    cephALEXin (KEFLEX) 250 MG capsule, Take 250 mg by mouth daily., Disp: , Rfl:    estradiol (ESTRACE) 0.1 MG/GM vaginal cream, Estrogen Cream Instruction  Discard applicator  Apply pea sized amount to tip of finger to urethra before bed. Wash hands well after application. Use Monday, Wednesday and Friday, Disp: , Rfl:    famotidine (PEPCID) 20 MG tablet, Take 1 tablet (20 mg total) by mouth 2 (two) times daily., Disp: 60 tablet, Rfl: 0   furosemide (LASIX) 20 MG tablet, Take 1 tablet (20 mg total) by mouth 2 (two) times daily., Disp: 90 tablet, Rfl: 3  potassium chloride (KLOR-CON M) 10 MEQ tablet, Take 1 tablet (10 mEq total) by mouth daily., Disp: 5 tablet, Rfl: 0   potassium chloride (KLOR-CON) 10 MEQ tablet, Take 10 mEq by mouth daily., Disp: , Rfl:    pramipexole (MIRAPEX) 1 MG tablet, Take 1 tablet (1 mg total) by mouth at bedtime.,  Disp: 90 tablet, Rfl: 1   QUEtiapine (SEROQUEL) 100 MG tablet, Take 300 mg by mouth at bedtime., Disp: , Rfl:    rosuvastatin (CRESTOR) 5 MG tablet, Take 1 tablet by mouth once daily, Disp: 90 tablet, Rfl: 1   sertraline (ZOLOFT) 25 MG tablet, Take 1 tablet (25 mg total) by mouth daily. (Patient taking differently: Take 100 mg by mouth daily.), Disp: 30 tablet, Rfl: 0   sitaGLIPtin (JANUVIA) 100 MG tablet, Take 1 tablet (100 mg total) by mouth daily., Disp: 45 tablet, Rfl: 4   traZODone (DESYREL) 50 MG tablet, Take 0.5-2 tablets (25-100 mg total) by mouth at bedtime as needed for sleep., Disp: 60 tablet, Rfl: 1   vedolizumab (ENTYVIO) 300 MG injection, Inject into the vein. Every 4 weeks, Disp: , Rfl:    doxycycline (VIBRAMYCIN) 100 MG capsule, Take 1 capsule (100 mg total) by mouth 2 (two) times daily. (Patient not taking: Reported on 03/19/2023), Disp: 14 capsule, Rfl: 0   fluconazole (DIFLUCAN) 100 MG tablet, Take once daily for 3 days (Patient not taking: Reported on 03/18/2023), Disp: 3 tablet, Rfl: 0   ondansetron (ZOFRAN-ODT) 8 MG disintegrating tablet, Take 1 tablet (8 mg total) by mouth every 8 (eight) hours as needed for nausea or vomiting. (Patient not taking: Reported on 03/29/2023), Disp: 60 tablet, Rfl: 1   PREMARIN vaginal cream, SMARTSIG:1 Vaginal Daily (Patient not taking: Reported on 03/18/2023), Disp: , Rfl:    promethazine (PHENERGAN) 25 MG tablet, Take 1 tablet (25 mg total) by mouth every 8 (eight) hours as needed for nausea or vomiting. (Patient not taking: Reported on 03/18/2023), Disp: 20 tablet, Rfl: 0   Rimegepant Sulfate 75 MG TBDP, Take 75 mg as needed for headache rescue (Patient not taking: Reported on 03/19/2023), Disp: , Rfl:  Medication Side Effects: none  Family Medical/ Social History: Changes?  no  MENTAL HEALTH EXAM:  There were no vitals taken for this visit.There is no height or weight on file to calculate BMI.  General Appearance: Casual and Well Groomed  Eye  Contact:  Good  Speech:  Clear and Coherent and Normal Rate  Volume:  Normal  Mood:  Euthymic  Affect:  Congruent  Thought Process:  Goal Directed and Descriptions of Associations: Circumstantial  Orientation:  Full (Time, Place, and Person)  Thought Content: Logical   Suicidal Thoughts:  No  Homicidal Thoughts:  No  Memory:   at her baseline  Judgement:  Good  Insight:  Good  Psychomotor Activity:  Normal  Concentration:  Concentration: Fair and Attention Span: Good  Recall:  Good  Fund of Knowledge: Good  Language: Good  Assets:  Communication Skills Desire for Improvement Financial Resources/Insurance Housing Transportation   ADL's:  Intact  Cognition: WNL  Prognosis:  Good   Labs 03/18/2023 CBC nl CMP Glu 160, Ca 8.5, Protein and albumin low, Alk phos 176, AST 144, ALT 112  DIAGNOSES:    ICD-10-CM   1. Generalized anxiety disorder  F41.1     2. Insomnia, unspecified type  G47.00     3. Restless legs syndrome (RLS)  G25.81     4. 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  Xanax filled 03/28/2023.  Ativan filled 02/08/2023.  Oxycodone filled 01/24/2023. I provided 30 minutes of face to face time during this encounter, including time spent before and after the visit in records review, medical decision making, counseling pertinent to today's visit, and charting.   Sleep hygiene discussed.  No caffeine after 2 PM for sure and even earlier is better.  We discussed the fact that caffeine is a drug, a stimulant, and there is more sensitivity to it for some people the older they get.  Recommend starting trazodone.  She has taken that at some point in the past but she does not remember when or what the effects were.  Benefits, risk and side effects were discussed and she accepts.  I recommend leaving the Xanax the same for now.  We will continue to decrease the dose when she is  stable in other areas of her mental health.  She understands and wants to continue this treatment plan.  Continue Xanax 0.5 mg, 1 p.o. twice daily.      Continue pramipexole 1 mg, 1 p.o. nightly. Continue Seroquel 100 mg, 3 p.o. nightly. Continue Zoloft 100 mg, 1 p.o. daily. Start trazodone 50 mg, one half up to 2 p.o. nightly as needed sleep. Recommend counseling. Return in 4 weeks.  Melony Overly, PA-C

## 2023-04-03 ENCOUNTER — Ambulatory Visit: Payer: BC Managed Care – PPO | Admitting: Physician Assistant

## 2023-04-03 NOTE — Telephone Encounter (Signed)
MyChart message to patient to go to lab

## 2023-04-03 NOTE — Telephone Encounter (Signed)
Duplicate/error

## 2023-04-03 NOTE — Telephone Encounter (Signed)
-----   Message from [facility] Marylu Lund H sent at 01/07/2023  4:46 PM EDT ----- Regarding: LFTs Due for LFTs in mid Sept

## 2023-04-03 NOTE — Telephone Encounter (Signed)
-----   Message from Nurse [place] P sent at 03/20/2023  8:48 AM EDT ----- Regarding: 2-week labs Hepatic function panel due - order in epic

## 2023-04-08 ENCOUNTER — Other Ambulatory Visit: Payer: Self-pay | Admitting: Physician Assistant

## 2023-04-10 ENCOUNTER — Ambulatory Visit: Payer: BC Managed Care – PPO | Attending: Physician Assistant

## 2023-04-10 DIAGNOSIS — I5032 Chronic diastolic (congestive) heart failure: Secondary | ICD-10-CM | POA: Diagnosis not present

## 2023-04-10 LAB — ECHOCARDIOGRAM COMPLETE
Area-P 1/2: 3.6 cm2
S' Lateral: 2 cm

## 2023-04-11 ENCOUNTER — Telehealth: Payer: Self-pay | Admitting: Physician Assistant

## 2023-04-11 ENCOUNTER — Other Ambulatory Visit (INDEPENDENT_AMBULATORY_CARE_PROVIDER_SITE_OTHER): Payer: BC Managed Care – PPO

## 2023-04-11 ENCOUNTER — Encounter: Payer: Self-pay | Admitting: Internal Medicine

## 2023-04-11 ENCOUNTER — Ambulatory Visit: Payer: BC Managed Care – PPO | Attending: Internal Medicine | Admitting: Internal Medicine

## 2023-04-11 ENCOUNTER — Ambulatory Visit (INDEPENDENT_AMBULATORY_CARE_PROVIDER_SITE_OTHER): Payer: BC Managed Care – PPO

## 2023-04-11 VITALS — BP 112/66 | HR 91 | Ht 60.0 in | Wt 110.6 lb

## 2023-04-11 VITALS — BP 113/79 | HR 76 | Temp 98.5°F | Resp 16 | Ht 60.0 in | Wt 112.4 lb

## 2023-04-11 DIAGNOSIS — K5100 Ulcerative (chronic) pancolitis without complications: Secondary | ICD-10-CM

## 2023-04-11 DIAGNOSIS — I5032 Chronic diastolic (congestive) heart failure: Secondary | ICD-10-CM

## 2023-04-11 DIAGNOSIS — I471 Supraventricular tachycardia, unspecified: Secondary | ICD-10-CM | POA: Diagnosis not present

## 2023-04-11 DIAGNOSIS — R002 Palpitations: Secondary | ICD-10-CM

## 2023-04-11 DIAGNOSIS — R072 Precordial pain: Secondary | ICD-10-CM

## 2023-04-11 DIAGNOSIS — R7989 Other specified abnormal findings of blood chemistry: Secondary | ICD-10-CM

## 2023-04-11 DIAGNOSIS — Z8673 Personal history of transient ischemic attack (TIA), and cerebral infarction without residual deficits: Secondary | ICD-10-CM

## 2023-04-11 DIAGNOSIS — K50119 Crohn's disease of large intestine with unspecified complications: Secondary | ICD-10-CM

## 2023-04-11 DIAGNOSIS — D509 Iron deficiency anemia, unspecified: Secondary | ICD-10-CM | POA: Diagnosis not present

## 2023-04-11 LAB — IBC + FERRITIN
Ferritin: 42.2 ng/mL (ref 10.0–291.0)
Iron: 56 ug/dL (ref 42–145)
Saturation Ratios: 16.2 % — ABNORMAL LOW (ref 20.0–50.0)
TIBC: 345.8 ug/dL (ref 250.0–450.0)
Transferrin: 247 mg/dL (ref 212.0–360.0)

## 2023-04-11 LAB — HEPATIC FUNCTION PANEL
ALT: 61 U/L — ABNORMAL HIGH (ref 0–35)
AST: 50 U/L — ABNORMAL HIGH (ref 0–37)
Albumin: 3.3 g/dL — ABNORMAL LOW (ref 3.5–5.2)
Alkaline Phosphatase: 163 U/L — ABNORMAL HIGH (ref 39–117)
Bilirubin, Direct: 0.1 mg/dL (ref 0.0–0.3)
Total Bilirubin: 0.3 mg/dL (ref 0.2–1.2)
Total Protein: 6.2 g/dL (ref 6.0–8.3)

## 2023-04-11 MED ORDER — METOPROLOL TARTRATE 25 MG PO TABS: 25.0000 mg | ORAL_TABLET | Freq: Two times a day (BID) | ORAL | 3 refills | Status: DC

## 2023-04-11 MED ORDER — VEDOLIZUMAB 300 MG IV SOLR
300.0000 mg | Freq: Once | INTRAVENOUS | Status: AC
  Administered 2023-04-11: 300 mg via INTRAVENOUS
  Filled 2023-04-11: qty 0

## 2023-04-11 MED ORDER — METOPROLOL TARTRATE 100 MG PO TABS: ORAL_TABLET | ORAL | 0 refills | Status: DC

## 2023-04-11 NOTE — Telephone Encounter (Signed)
Rx was sent yesterday. LVM for patient.

## 2023-04-11 NOTE — Patient Instructions (Addendum)
Medication Instructions:  Your physician recommends the following medication changes.  START TAKING: Metoprolol tartrate 25 mg by mouth twice a day  *If you need a refill on your cardiac medications before your next appointment, please call your pharmacy*   Lab Work: Your provider would like for you to have following labs drawn today (BMP).     Testing/Procedures: Cardiac CT Angiography (CTA), is a special type of CT scan that uses a computer to produce multi-dimensional views of major blood vessels throughout the body. In CT angiography, a contrast material is injected through an IV to help visualize the blood vessels Please see instructions below   Follow-Up: At [facility], you and your health needs are our priority.  As part of our continuing mission to provide you with exceptional heart care, we have created designated Provider Care Teams.  These Care Teams include your primary Cardiologist (physician) and Advanced Practice Providers (APPs -  Physician Assistants and Nurse Practitioners) who all work together to provide you with the care you need, when you need it.  We recommend signing up for the patient portal called "MyChart".  Sign up information is provided on this After Visit Summary.  MyChart is used to connect with patients for Virtual Visits (Telemedicine).  Patients are able to view lab/test results, encounter notes, upcoming appointments, etc.  Non-urgent messages can be sent to your provider as well.   To learn more about what you can do with MyChart, go to [email or web address].    Your next appointment:   3 month(s)  Provider:   You may see Yvonne Kendall, MD or one of the following Advanced Practice Providers on your designated Care Team:   Nicolasa Ducking, NP Eula Listen, PA-C Cadence Fransico Michael, PA-C Charlsie Quest, NP      Your cardiac CT will be scheduled at one of the below locations:   [facility] [address] [place], [place] [address] [phone]  OR   [facility] [address] [place], [place] [address] [phone]  If scheduled at [facility], please arrive at the [facility] and Children's Entrance (Entrance C2) of [facility] 30 minutes prior to test start time. You can use the FREE valet parking offered at entrance C (encouraged to control the heart rate for the test)  Proceed to the [facility] Radiology Department (first floor) to check-in and test prep.  All radiology patients and guests should use entrance C2 at [facility], accessed from [facility], even though the hospital's physical address listed is [address].    If scheduled at [facility] or [facility], please arrive 15 mins early for check-in and test prep.  There is spacious parking and easy access to the radiology department from the [facility] Heart and Vascular entrance. Please enter here and check-in with the desk attendant.   Please follow these instructions carefully (unless otherwise directed):  On the Night Before the Test: Be sure to Drink plenty of water. Do not consume any caffeinated/decaffeinated beverages or chocolate 12 hours prior to your test. Do not take any antihistamines 12 hours prior to your test.  On the Day of the Test: Drink plenty of water until 1 hour prior to the test. Do not eat any food 1 hour prior to test. You may take your regular medications prior to the test.  Take metoprolol (Lopressor) 100 mg two hours prior to test (Please  hold Metoprolol 25 mg this morning of procedure). If you take Furosemide/Hydrochlorothiazide/Spironolactone, please HOLD on the morning of the test. FEMALES- please wear underwire-free bra if available, avoid dresses & tight clothing       After the Test: Drink plenty of water. After receiving IV contrast, you may experience a mild  flushed feeling. This is normal. On occasion, you may experience a mild rash up to 24 hours after the test. This is not dangerous. If this occurs, you can take Benadryl 25 mg and increase your fluid intake. If you experience trouble breathing, this can be serious. If it is severe call 911 IMMEDIATELY. If it is mild, please call our office. If you take any of these medications: Glipizide/Metformin, Avandament, Glucavance, please do not take 48 hours after completing test unless otherwise instructed.  We will call to schedule your test 2-4 weeks out understanding that some insurance companies will need an authorization prior to the service being performed.   For more information and frequently asked questions, please visit our website : [email or web address]  For non-scheduling related questions, please contact the cardiac imaging nurse navigator should you have any questions/concerns: Cardiac Imaging Nurse Navigators Direct Office Dial: [phone]   For scheduling needs, including cancellations and rescheduling, please call [place], [phone].

## 2023-04-11 NOTE — Progress Notes (Signed)
Cardiology Office Note:  .   Date:  04/11/2023  ID:  Amy Hansen, DOB [birth year], MRN [id number] PCP: Amy Grooms, NP  [facility] Providers Cardiologist:  Amy Kendall, MD     History of Present Illness: .   Amy Hansen is a 67 y.o. female with history of HFpEF, atypical chest pain, PSVT, stroke, type 2 diabetes mellitus, anemia, indeterminate colitis, abnormal LFTs, GERD, hiatal hernia, restless leg syndrome, anxiety, and depression, who presents for follow-up of leg edema.  She was last seen 3 weeks ago by Amy Listen, PA, at which time she complained of intermittent foot and ankle swelling present since her hospitalization month earlier.  Her weight was noted to be down 22 pounds since her last visit with [name] in January.  She was referred for echocardiogram and instructed to take furosemide 20 mg twice daily.  Echocardiogram yesterday was unremarkable other than mild to moderate mitral and tricuspid regurgitation (see details below).  Today, Amy Hansen complains of intermittent episodes of pain under the left breast that radiates to the left arm.  The pain is not exertional, occurring randomly; it typically lasts a few minutes and resolves on its own.  She wonders if some of the pain could be due to a prior left arm fracture.  She also reports an occasional "sinking" feeling, almost like she is going to pass out.  She had been falling frequently on account of this up until ~6 months ago.  Fortunately, the "sinking feeling" and falls have improved.  She has been weaning off alprazolam and wonders if this is helping her balance.  She notes occasional palpitations without associated symptoms.  She denies shortness of breath but continues to have waxing and waning edema in her legs.  Her weight fluctuates by a couple of pounds but overall has been fairly stable.  ROS: See HPI  Studies Reviewed: [name]   EKG Interpretation Date/Time:  Thursday April 11 2023 15:25:37  EDT Ventricular Rate:  91 PR Interval:  172 QRS Duration:  80 QT Interval:  354 QTC Calculation: 435 R Axis:   -18  Text Interpretation: Normal sinus rhythm Minimal voltage criteria for LVH, may be normal variant ( R in aVL ) Borderline ECG When compared with ECG of 19-Mar-2023 14:45, No significant change was found Confirmed by Marielouise Amey, Cristal Deer [phone]) on 04/11/2023 3:31:04 PM    TTE (04/10/2023): Normal LV size and wall thickness.  LVEF 60-65% with grade 1 diastolic dysfunction and normal wall motion.  Normal RV size and function.  Unable to assess RV pressure.  Normal biatrial size.  No pericardial effusion.  Moderate mitral annular calcification with mild to moderate regurgitation.  Mild to moderate tricuspid regurgitation.  Normal CVP.  Risk Assessment/Calculations:             Physical Exam:   VS:  BP 112/66 (BP Location: Left Arm, Patient Position: Sitting, Cuff Size: Normal)   Pulse 91   Ht 5' (1.524 m)   Wt 110 lb 9.6 oz (50.2 kg)   LMP  (LMP Unknown)   SpO2 98%   BMI 21.60 kg/m    Wt Readings from Last 3 Encounters:  04/11/23 110 lb 9.6 oz (50.2 kg)  04/11/23 112 lb 6.4 oz (51 kg)  03/19/23 110 lb 6.4 oz (50.1 kg)    General:  NAD. Neck: No JVD or HJR. Lungs: Clear to auscultation bilaterally without wheezes or crackles. Heart: Regular rate and rhythm without murmurs, rubs, or gallops. Abdomen: Soft, nontender,  nondistended. Extremities: No lower extremity edema.  ASSESSMENT AND PLAN: .    Precordial pain: Pain is not typical for coronary insufficiency.  However, given multiple risk factors, we have agreed to obtain a coronary CTA for further evaluation.  I have also recommended resuming metoprolol tartrate 25 mg twice daily.  Palpitations and PSVT: Resume metoprolol tartrate 25 mg twice daily, as this had offered her some relief in the past but was discontinued at her request.  She wishes to try it again.  Chronic HFpEF: Edema continues to come and go, though  Amy Hansen appears euvolemic today.  She should continue taking furosemide and limiting her sodium intake.  History of stroke: Continue aspirin and statin therapy per PCP.    Dispo: Return to clinic in 3 months.  Signed, Amy Kendall, MD

## 2023-04-11 NOTE — Telephone Encounter (Signed)
Next visit is 04/29/23. Requesting refill on Alprazolam called to:   [facility] - [place], [place] - [address]    Phone: [phone]  Fax: [phone]

## 2023-04-11 NOTE — Progress Notes (Signed)
Diagnosis: Crohn's Disease  Provider:  Chilton Greathouse MD  Procedure: IV Infusion  IV Type: Peripheral, IV Location: L Antecubital  Entyvio (Vedolizumab), Dose: 300 mg  Infusion Start Time: 1130  Infusion Stop Time: 1201  Post Infusion IV Care: Peripheral IV Discontinued  Discharge: Condition: Good, Destination: Home . AVS Declined  Performed by:  Loney Hering, LPN

## 2023-04-12 ENCOUNTER — Other Ambulatory Visit: Payer: Self-pay

## 2023-04-12 DIAGNOSIS — R7989 Other specified abnormal findings of blood chemistry: Secondary | ICD-10-CM

## 2023-04-12 LAB — BASIC METABOLIC PANEL
BUN/Creatinine Ratio: 14 (ref 12–28)
BUN: 8 mg/dL (ref 8–27)
CO2: 23 mmol/L (ref 20–29)
Calcium: 8.5 mg/dL — ABNORMAL LOW (ref 8.7–10.3)
Chloride: 103 mmol/L (ref 96–106)
Creatinine, Ser: 0.58 mg/dL (ref 0.57–1.00)
Glucose: 138 mg/dL — ABNORMAL HIGH (ref 70–99)
Potassium: 4 mmol/L (ref 3.5–5.2)
Sodium: 138 mmol/L (ref 134–144)
eGFR: 100 mL/min/{1.73_m2} (ref >59)

## 2023-04-13 ENCOUNTER — Encounter: Payer: Self-pay | Admitting: Internal Medicine

## 2023-04-13 DIAGNOSIS — R002 Palpitations: Secondary | ICD-10-CM | POA: Insufficient documentation

## 2023-04-13 DIAGNOSIS — I471 Supraventricular tachycardia, unspecified: Secondary | ICD-10-CM | POA: Insufficient documentation

## 2023-04-13 DIAGNOSIS — R072 Precordial pain: Secondary | ICD-10-CM | POA: Insufficient documentation

## 2023-04-19 ENCOUNTER — Ambulatory Visit: Payer: BC Managed Care – PPO | Admitting: Physician Assistant

## 2023-04-22 ENCOUNTER — Other Ambulatory Visit: Payer: Self-pay | Admitting: Physician Assistant

## 2023-04-23 ENCOUNTER — Encounter: Payer: Self-pay | Admitting: Gastroenterology

## 2023-04-23 ENCOUNTER — Ambulatory Visit (INDEPENDENT_AMBULATORY_CARE_PROVIDER_SITE_OTHER): Payer: BC Managed Care – PPO | Admitting: Gastroenterology

## 2023-04-23 VITALS — BP 112/64 | HR 76 | Ht 60.0 in | Wt 116.6 lb

## 2023-04-23 DIAGNOSIS — R7989 Other specified abnormal findings of blood chemistry: Secondary | ICD-10-CM | POA: Diagnosis not present

## 2023-04-23 DIAGNOSIS — K50119 Crohn's disease of large intestine with unspecified complications: Secondary | ICD-10-CM

## 2023-04-23 NOTE — Patient Instructions (Addendum)
Please return for your labs in 2 weeks. Labs are located on the basement.   _______________________________________________________  If your blood pressure at your visit was 140/90 or greater, please contact your primary care physician to follow up on this.  _______________________________________________________  If you are age 66 or older, your body mass index should be between 23-30. Your Body mass index is 22.77 kg/m. If this is out of the aforementioned range listed, please consider follow up with your Primary Care Provider.  If you are age 42 or younger, your body mass index should be between 19-25. Your Body mass index is 22.77 kg/m. If this is out of the aformentioned range listed, please consider follow up with your Primary Care Provider.   ________________________________________________________  The [facility] providers would like to encourage you to use [facility] to communicate with providers for non-urgent requests or questions.  Due to long hold times on the telephone, sending your provider a message by [facility] may be a faster and more efficient way to get a response.  Please allow 48 business hours for a response.  Please remember that this is for non-urgent requests.  _______________________________________________________ It was a pleasure to see you today!  Thank you for trusting me with your gastrointestinal care!

## 2023-04-23 NOTE — Progress Notes (Signed)
Agree with assessment and plan as outlined.  

## 2023-04-23 NOTE — Progress Notes (Signed)
04/23/2023 Amy Hansen [id number] [birth year]   HISTORY OF PRESENT ILLNESS: This is a 67 year old female who is a patient of Dr. 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  She has also been followed with him most recently for elevated LFTs thought to possibly be due to [facility] secondary to Lamictal or Seroquel.  She is coming off of some of her psychotropic medications and they have been following her LFTs, discussion of liver biopsy, etc.  She was seen by our team in the hospital for couple of days in July for complaints of nausea, vomiting, inability to keep p.o.'s, and weight loss.  CT scan of the abdomen and pelvis with contrast was performed and did not show any cause of her symptoms.  She also had a humerus fracture back in May so was taking pain medication for that, had been on Mounjaro as well.  It was thought maybe combination of those medications and inability to tolerate her psychotropics was causing some withdrawal could have been causing these nausea and vomiting symptoms.  Symptoms turned around quickly.  She is here for follow-up of all that.  Is doing well, no further nausea and vomiting since then.  Is coming off of some of her medications as above.  Last colonoscopy May 2023 with mildly active colitis and Entyvio dosing interval was decreased at that time.  EGD in 04/2022 with a normal-appearing esophagus, had mild reactive gastropathy but no H. pylori.  Prior work-up  Colonoscopy 05/03/2020: - Mild to moderate inflammation from anus to cecum with a normal appearing terminal ileum. Multiple biopsies taken.   A. SMALL BOWEL, TERMINAL ILEUM, BIOPSY:  - Focally active nonspecific ileitis  - Negative for features of chronicity or granulomas   B. COLON, RIGHT, BIOPSY:  - Patchy active nonspecific colitis with focal ulceration  - Focal microscopic aggregate of epithelioid histiocytes  suggestive of a  microadenoma  - See comment   C. COLON, LEFT, BIOPSY:  - Patchy mildly active nonspecific colitis  - Negative for features of chronicity or granulomas  COMMENT:   A, B and C.   Taken together, the ileocolonic biopsies show patchy  active colitis with microgranulomas within the right colon.  Diagnostic  histologic features of idiopathic inflammatory bowel disease, including  lamina propria and basal lymphoplasmacytosis or crypt architectural  distortion are not present.  Differential diagnosis can include  infection, drug effect and evolving/early inflammatory bowel disease  (e.g. Crohn's disease).  AFB and GMS special stains are negative for  acid-fast bacilli and fungal organisms respectively.  Clinical-radiologic correlation and patient follow-up is suggested.      CTAP with contrast 04/21/2020: 1. Diffuse circumferential wall thickening of virtually all of the colon, most notably at the level of the cecum and ascending colon, consistent with infectious or inflammatory colitis. 2. There is a punctate focus of gas within the urinary bladder. Correlate for history of recent instrumentation.  Aortic Atherosclerosis      Colonoscopy 10/06/20 - The perianal and digital rectal examinations were normal. - The terminal ileum appeared normal. - Patchy inflammation characterized by erosions, granularity, mucus and aphthous ulcerations was found in the entire colon, most prominent in the left colon. Biopsies were taken with a cold forceps for histology. - Scattered medium-mouthed diverticula were found in the entire colon. - The exam was otherwise without abnormality.   1. Surgical [P], random right sided sites - CHRONIC ACTIVE  COLITIS - NO GRANULOMATA, DYSPLASIA OR MALIGNANCY IDENTIFIED 2. Surgical [P], colon, transverse - CHRONIC ACTIVE COLITIS - NO GRANULOMATA, DYSPLASIA OR MALIGNANCY IDENTIFIED 3. Surgical [P], random left sided sites - CHRONIC ACTIVE COLITIS -  NO GRANULOMATA, DYSPLASIA OR MALIGNANCY IDENTIFIED       Colonoscopy 12/26/21: - The perianal and digital rectal examinations were normal. - The terminal ileum appeared normal. - Multiple small-mouthed diverticula were found in the entire colon. - Localized mild inflammation characterized by erosions and erythema was found in the transverse colon in one very focal area. Biopsies were taken with a cold forceps for histology. - A 4 mm polyp was found in the distal rectum. The polyp was sessile. The polyp was removed with a cold biopsy forceps. Resection and retrieval were complete. - The exam was otherwise without abnormality in regards to inflammatory burden. No overt inflammation seen elsewhere. There was significant residual stool. The prep was adequate enough to assess for inflammatory changes but small or flat lesions may not have been appreciated in some portions of the colon. - Biopsies were taken with a cold forceps for histology in the right, transverse, and left colon.   1. Surgical [P], random right colon sites - COLONIC MUCOSA WITH NO SPECIFIC HISTOPATHOLOGIC CHANGES - NEGATIVE FOR ACUTE INFLAMMATION, FEATURES OF CHRONICITY, GRANULOMAS OR DYSPLASIA 2. Surgical [P], colon, transverse - INACTIVE CHRONIC COLITIS, MANIFESTED ONLY BY FOCAL MILD ARCHITECTURAL CHANGES - NEGATIVE FOR GRANULOMAS OR DYSPLASIA 3. Surgical [P], random left colon sites - COLONIC MUCOSA WITH NO SPECIFIC HISTOPATHOLOGIC CHANGES - NEGATIVE FOR ACUTE INFLAMMATION, FEATURES OF CHRONICITY, GRANULOMAS OR DYSPLASIA 4. Surgical [P], colon, rectum, polyp (1) - HYPERPLASTIC POLYP     Entyvio levels 01/17/22 - level of 7.7, AB undetectable - increased dosing to q 4 weeks this past summer- July   EGD 05/02/22: - The exam of the esophagus was otherwise normal. - Evidence of a Roux-en-Y gastrojejunostomy was found. The gastrojejunal anastomosis was characterized by 2 small areas of ulceration. - The gastric pouch was  otherwise small but normal. - Biopsies were taken with a cold forceps for Helicobacter pylori testing. - The examined small bowel limb and blind loop were normal.   Fecal calprotectin 09/25/22 - 59  Past Medical History:  Diagnosis Date   Anemia    Anxiety    Arthritis    Blood transfusion without reported diagnosis    Cataract    CHF (congestive heart failure) (HCC)    Chronic kidney disease    UTI, hematuria in urine   Colitis    Crohn's disease (HCC)    Depression    Diabetes (HCC)    Diverticulosis    Frequent headaches    Interstitial cystitis    Recurrent UTI    Restless leg syndrome    TIA (transient ischemic attack) 02/20/2021   Urinary frequency    Past Surgical History:  Procedure Laterality Date   bariatric bypass  2012   BIOPSY  05/03/2020   Procedure: BIOPSY;  Surgeon: Rachael Fee, MD;  Location: [facility] ENDOSCOPY;  Service: Endoscopy;;   CARPAL TUNNEL RELEASE Right 2003   CARPAL TUNNEL RELEASE Right    2008   CHOLECYSTECTOMY  1975   COLONOSCOPY     COLONOSCOPY WITH PROPOFOL N/A 05/03/2020   Procedure: COLONOSCOPY WITH PROPOFOL;  Surgeon: Rachael Fee, MD;  Location: [facility] ENDOSCOPY;  Service: Endoscopy;  Laterality: N/A;   CYSTOSCOPY W/ RETROGRADES Bilateral 06/06/2015   Procedure: CYSTOSCOPY WITH RETROGRADE PYELOGRAM;  Surgeon: Jerilee Field, MD;  Location: [facility] ORS;  Service: Urology;  Laterality: Bilateral;   EYE SURGERY Left 07/06/2022   FL INJ LEFT KNEE CT ARTHROGRAM ([facility] HX) Left    1995   GASTRIC BYPASS  2010   HAND SURGERY Left 01/19/2021   Thumb   HEMORRHOID SURGERY  2013   KNEE ARTHROSCOPY Left 1996   TONSILLECTOMY     TOTAL ABDOMINAL HYSTERECTOMY W/ BILATERAL SALPINGOOPHORECTOMY      reports that she quit smoking about 48 years ago. Her smoking use included cigarettes. She has never used smokeless tobacco. She reports that she does not drink alcohol and does not use drugs. family history includes Colon cancer in her mother; Heart  failure in her sister; Stroke in her father. Allergies  Allergen Reactions   Avelox [Moxifloxacin Hcl In Nacl] Anaphylaxis   Bactrim [Sulfamethoxazole-Trimethoprim] Anaphylaxis   Ciprofloxacin Other (See Comments)    Pt states she was told never to take this as it is in the same family as Avelox.    Buspar [Buspirone] Other (See Comments)    hallucinations   Neomycin-Polymyxin-Dexameth Other (See Comments)    Burning in the eyes   Aquaphor [Lanolin-Petrolatum] Itching and Rash   Depakote [Divalproex Sodium] Other (See Comments)    Unknown Reaction   Imitrex [Sumatriptan] Other (See Comments)    Neck and shoulder pain   Stadol [Butorphanol] Rash      Outpatient Encounter Medications as of 04/23/2023  Medication Sig   ALPRAZolam (XANAX) 0.5 MG tablet Take 1 tablet (0.5 mg total) by mouth 2 (two) times daily as needed for anxiety.   aspirin EC 81 MG tablet Take 1 tablet (81 mg) by mouth once daily. Swallow whole.   cephALEXin (KEFLEX) 250 MG capsule Take 250 mg by mouth daily.   famotidine (PEPCID) 20 MG tablet Take 1 tablet (20 mg total) by mouth 2 (two) times daily.   furosemide (LASIX) 20 MG tablet Take 1 tablet (20 mg total) by mouth 2 (two) times daily. (Patient taking differently: Take 20 mg by mouth 2 (two) times daily. 1 to 2 per day QAM)   metoprolol tartrate (LOPRESSOR) 100 MG tablet TAKE 1 TABLET 2 HR PRIOR TO CARDIAC PROCEDURE   metoprolol tartrate (LOPRESSOR) 25 MG tablet Take 1 tablet (25 mg total) by mouth 2 (two) times daily.   ondansetron (ZOFRAN-ODT) 8 MG disintegrating tablet Take 1 tablet (8 mg total) by mouth every 8 (eight) hours as needed for nausea or vomiting.   pramipexole (MIRAPEX) 1 MG tablet Take 1 tablet (1 mg total) by mouth at bedtime.   PREMARIN vaginal cream    QUEtiapine (SEROQUEL) 100 MG tablet Take 300 mg by mouth at bedtime.   rosuvastatin (CRESTOR) 5 MG tablet Take 1 tablet by mouth once daily   sertraline (ZOLOFT) 25 MG tablet Take 1 tablet (25 mg  total) by mouth daily. (Patient taking differently: Take 100 mg by mouth daily.)   sitaGLIPtin (JANUVIA) 100 MG tablet Take 1 tablet (100 mg total) by mouth daily.   vedolizumab (ENTYVIO) 300 MG injection Inject into the vein. Every 4 weeks   No facility-administered encounter medications on file as of 04/23/2023.    REVIEW OF SYSTEMS  : All other systems reviewed and negative except where noted in the History of Present Illness.   PHYSICAL EXAM: BP 112/64   Pulse 76   Ht 5' (1.524 m)   Wt 116 lb 9.6 oz (52.9 kg)   LMP  (LMP Unknown)   BMI 22.77 kg/m  General:  Well developed white female in no acute distress Head: Normocephalic and atraumatic Eyes:  Sclerae anicteric, conjunctiva pink. Ears: Normal auditory acuity Lungs: Clear throughout to auscultation; no W/R/R. Heart: Regular rate and rhythm; no M/R/G. Abdomen: Soft, non-distended.  BS present.  Mild diffuse TTP. Musculoskeletal: Symmetrical with no gross deformities  Skin: No lesions on visible extremities Extremities: No edema  Neurological: Alert oriented x 4, grossly non-focal Psychological:  Alert and cooperative. 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  Symptoms much improved/resolved at this point. *Elevated LFTs: Thought possibly from [facility], she is coming off of a lot of her psychotropic medications, etc.  Plan already in place with Dr. Adela Lank recheck LFTs in 2 more weeks possibly liver biopsy if they remain elevated.  CC:  [name], NP

## 2023-04-29 ENCOUNTER — Encounter: Payer: Self-pay | Admitting: Physician Assistant

## 2023-04-29 ENCOUNTER — Ambulatory Visit (INDEPENDENT_AMBULATORY_CARE_PROVIDER_SITE_OTHER): Payer: BC Managed Care – PPO | Admitting: Physician Assistant

## 2023-04-29 DIAGNOSIS — G4700 Insomnia, unspecified: Secondary | ICD-10-CM

## 2023-04-29 DIAGNOSIS — F1520 Other stimulant dependence, uncomplicated: Secondary | ICD-10-CM

## 2023-04-29 DIAGNOSIS — F1320 Sedative, hypnotic or anxiolytic dependence, uncomplicated: Secondary | ICD-10-CM

## 2023-04-29 DIAGNOSIS — F411 Generalized anxiety disorder: Secondary | ICD-10-CM

## 2023-04-29 DIAGNOSIS — G2581 Restless legs syndrome: Secondary | ICD-10-CM

## 2023-04-29 DIAGNOSIS — F319 Bipolar disorder, unspecified: Secondary | ICD-10-CM | POA: Diagnosis not present

## 2023-04-29 NOTE — Progress Notes (Signed)
[facility] Med Check  Patient ID: Amy Hansen,  MRN: [id number]  PCP: [name], NP  Date of Evaluation: 04/29/2023  Time spent: 33 minutes  Chief Complaint:  Chief Complaint   Anxiety; Depression; Insomnia; Follow-up    HISTORY/CURRENT STATUS: For routine med check.  Seen alone.  States she's more anxious since restarting metoprolol about 2 weeks ago for palpitations, by cardiology. She'd been on it in the hospital and it seemed to help then. Is having LE edema, on Lasix. Has Cardiac CT on 05/02/2023.  She is anxious about what they will find.  Has taken "a few extra Xanax" than she was supposed to since the last visit.  States she's drinking Diet Dr. Reino Kent, Diet Cheerwine, and Diet Mt Dew, approx 5 per day.    States she's taking 200 mg zoloft.  According to my records she is supposed to be on 100 but she is doing fine at this dose.  Patient is able to enjoy things.  Energy and motivation are good.  She is not working.  States she does not think she can anymore because of her physical and mental health.  No extreme sadness, tearfulness, or feelings of hopelessness.  Sleeps fairly well.  ADLs and personal hygiene are normal.   No change in memory.  Appetite has not changed.  Weight is stable.  Denies suicidal or homicidal thoughts.  Patient denies increased energy with decreased need for sleep, increased talkativeness, racing thoughts, impulsivity or risky behaviors, increased spending, increased libido, grandiosity, increased irritability or anger, paranoia, or hallucinations.  Denies dizziness, syncope, seizures, numbness, tingling, tremor, tics, unsteady gait, slurred speech, confusion.  No recent falls.  Denies muscle or joint pain, stiffness, or dystonia. Denies unexplained weight loss, frequent infections, or sores that heal slowly.  No polyphagia, polydipsia, or polyuria. Denies visual changes or paresthesias.   Individual Medical History/ Review of Systems: Changes?  :Yes   seeing cardiology, see notes  Past medications for mental health diagnoses include: Trazodone, Risperdal, Zoloft, Lunesta, prazosin, Sonata, Prozac, Depakote, Lamictal, lithium, Wellbutrin, Xanax, Ambien, carbamazepine, Seroquel, Buspar caused falls and hallucinations, Gabapentin-she doesn't want to take  Allergies: Avelox [moxifloxacin hcl in nacl], Bactrim [sulfamethoxazole-trimethoprim], Ciprofloxacin, Buspar [buspirone], Neomycin-polymyxin-dexameth, Aquaphor [lanolin-petrolatum], Depakote [divalproex sodium], Imitrex [sumatriptan], and Stadol [butorphanol]  Current Medications:  Current Outpatient Medications:    ALPRAZolam (XANAX) 0.5 MG tablet, Take 1 tablet (0.5 mg total) by mouth 2 (two) times daily as needed for anxiety., Disp: 30 tablet, Rfl: 0   aspirin EC 81 MG tablet, Take 1 tablet (81 mg) by mouth once daily. Swallow whole., Disp: , Rfl:    cephALEXin (KEFLEX) 250 MG capsule, Take 250 mg by mouth daily., Disp: , Rfl:    famotidine (PEPCID) 20 MG tablet, Take 1 tablet (20 mg total) by mouth 2 (two) times daily., Disp: 60 tablet, Rfl: 0   furosemide (LASIX) 20 MG tablet, Take 1 tablet (20 mg total) by mouth 2 (two) times daily. (Patient taking differently: Take 20 mg by mouth 2 (two) times daily. 1 to 2 per day QAM), Disp: 90 tablet, Rfl: 3   metoprolol tartrate (LOPRESSOR) 25 MG tablet, Take 1 tablet (25 mg total) by mouth 2 (two) times daily., Disp: 180 tablet, Rfl: 3   ondansetron (ZOFRAN-ODT) 8 MG disintegrating tablet, Take 1 tablet (8 mg total) by mouth every 8 (eight) hours as needed for nausea or vomiting., Disp: 60 tablet, Rfl: 1   pramipexole (MIRAPEX) 1 MG tablet, Take 1 tablet (1 mg total) by  mouth at bedtime., Disp: 90 tablet, Rfl: 1   PREMARIN vaginal cream, , Disp: , Rfl:    QUEtiapine (SEROQUEL) 100 MG tablet, Take 300 mg by mouth at bedtime., Disp: , Rfl:    rosuvastatin (CRESTOR) 5 MG tablet, Take 1 tablet by mouth once daily, Disp: 90 tablet, Rfl: 1    sertraline (ZOLOFT) 25 MG tablet, Take 1 tablet (25 mg total) by mouth daily. (Patient taking differently: Take 200 mg by mouth daily.), Disp: 30 tablet, Rfl: 0   sitaGLIPtin (JANUVIA) 100 MG tablet, Take 1 tablet (100 mg total) by mouth daily., Disp: 45 tablet, Rfl: 4   vedolizumab (ENTYVIO) 300 MG injection, Inject into the vein. Every 4 weeks, Disp: , Rfl:    metoprolol tartrate (LOPRESSOR) 100 MG tablet, TAKE 1 TABLET 2 HR PRIOR TO CARDIAC PROCEDURE (Patient not taking: Reported on 04/29/2023), Disp: 1 tablet, Rfl: 0 Medication Side Effects: none  Family Medical/ Social History: Changes?  no  MENTAL HEALTH EXAM:  There were no vitals taken for this visit.There is no height or weight on file to calculate BMI.  General Appearance: Casual and Well Groomed  Eye Contact:  Good  Speech:  Clear and Coherent and Normal Rate  Volume:  Normal  Mood:  Euthymic  Affect:  Congruent  Thought Process:  Goal Directed and Descriptions of Associations: Circumstantial  Orientation:  Full (Time, Place, and Person)  Thought Content: Logical   Suicidal Thoughts:  No  Homicidal Thoughts:  No  Memory:   at her baseline  Judgement:  Good  Insight:  Good  Psychomotor Activity:  Normal  Concentration:  Concentration: Fair and Attention Span: Good  Recall:  Good  Fund of Knowledge: Good  Language: Good  Assets:  Desire for Improvement Financial Resources/Insurance Housing Transportation   ADL's:  Intact  Cognition: WNL  Prognosis:  Good   Labs from 04/11/2023 reviewed.  DIAGNOSES:    ICD-10-CM   1. Generalized anxiety disorder  F41.1     2. Bipolar I disorder (HCC)  F31.9     3. Insomnia, unspecified type  G47.00     4. 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 Benzodiazepine dependence (HCC)  F13.20       Receiving Psychotherapy: No   RECOMMENDATIONS:  PDMP reviewed.  Xanax filled 04/23/2023. I provided 33 minutes of face to face time during  this encounter, including time spent before and after the visit in records review, medical decision making, counseling pertinent to today's visit, and charting.   I explained that caffeine is a stimulant, the most widely abused, legal drug, and is most likely a huge cause of her anxiety.  Don't stop cold [place], but decrease to 4 daily for 5 days, then 3 daily for 5 days, then 2 daily for 5 days, then 1 daily. If she doesn't have palpitations or feel really anxious, she can continue with 1 daily, but if it continues, stop caffeine completely.   Looking at the PDMP, I see a pattern of her getting the Xanax a few days early almost every time it is filled.  Patient was asked about this and said she did not know it was happening.  Our goal is still to completely get her off Xanax, but I have felt that she has needed it and have not decreased the dose in several months.  At this point I am leaving at the same, however if there  are any more problems I will start prescribing only a 1 week supply at a time and will continue to wean until she is completely off.  Continue Xanax 0.5 mg, 1 p.o. twice daily.  DON'T FILL EARLY. EVER. Not due until 10/7. Continue pramipexole 1 mg, 1 p.o. nightly. Continue Seroquel 100 mg, 3 p.o. nightly. Continue Zoloft 200 mg, 1 p.o. daily. Strongly recommend counseling. Return in 4 weeks.  Melony Overly, PA-C

## 2023-04-30 ENCOUNTER — Telehealth: Payer: Self-pay | Admitting: Internal Medicine

## 2023-04-30 NOTE — Telephone Encounter (Signed)
New Message:      Patient called, pt said she was given her instructions for her CT Test, She said she lost them. She wants to know if she can pick another one today, please?

## 2023-04-30 NOTE — Telephone Encounter (Signed)
Pt informed instructions will be placed up front for pick up. Pt verbalized understanding.

## 2023-05-01 ENCOUNTER — Telehealth (HOSPITAL_COMMUNITY): Payer: Self-pay | Admitting: Emergency Medicine

## 2023-05-01 NOTE — Telephone Encounter (Signed)
Attempted to call patient regarding upcoming cardiac CT appointment. °Left message on voicemail with name and callback number °Dwan Fennel RN Navigator Cardiac Imaging °[facility] °[phone] Office °[phone] Cell ° °

## 2023-05-02 ENCOUNTER — Other Ambulatory Visit: Payer: Self-pay | Admitting: Internal Medicine

## 2023-05-02 ENCOUNTER — Ambulatory Visit
Admission: RE | Admit: 2023-05-02 | Discharge: 2023-05-02 | Disposition: A | Discharge status: Home / Self Care | Payer: BC Managed Care – PPO | Source: Ambulatory Visit | Attending: Internal Medicine | Admitting: Internal Medicine

## 2023-05-02 DIAGNOSIS — R072 Precordial pain: Secondary | ICD-10-CM | POA: Diagnosis not present

## 2023-05-02 DIAGNOSIS — I2510 Atherosclerotic heart disease of native coronary artery without angina pectoris: Secondary | ICD-10-CM

## 2023-05-02 DIAGNOSIS — R931 Abnormal findings on diagnostic imaging of heart and coronary circulation: Secondary | ICD-10-CM | POA: Diagnosis not present

## 2023-05-02 MED ORDER — NITROGLYCERIN 0.4 MG SL SUBL
0.4000 mg | SUBLINGUAL_TABLET | Freq: Once | SUBLINGUAL | Status: AC
  Administered 2023-05-02: 0.4 mg via SUBLINGUAL

## 2023-05-02 MED ORDER — SODIUM CHLORIDE 0.9 % IV BOLUS
150.0000 mL | Freq: Once | INTRAVENOUS | Status: AC
  Administered 2023-05-02: 150 mL via INTRAVENOUS

## 2023-05-02 MED ORDER — IOHEXOL 350 MG/ML SOLN
75.0000 mL | Freq: Once | INTRAVENOUS | Status: AC | PRN
  Administered 2023-05-02: 75 mL via INTRAVENOUS

## 2023-05-02 NOTE — Progress Notes (Signed)
Patient tolerated procedure well. Ambulate w/o difficulty. Denies any lightheadedness or being dizzy. Pt denies any pain at this time. Sitting in chair, drinking water provided. P is encouraged to drink additional water throughout the day and reason explained to patient. Patient verbalized understanding and all questions answered. ABC intact. No further needs at this time. Discharge from procedure area w/o issues.  °

## 2023-05-06 ENCOUNTER — Other Ambulatory Visit: Payer: Self-pay | Admitting: Physician Assistant

## 2023-05-07 NOTE — Telephone Encounter (Signed)
Rx sent 

## 2023-05-07 NOTE — Telephone Encounter (Signed)
Pt called to request RF again. She is out of Xanax.

## 2023-05-09 ENCOUNTER — Ambulatory Visit (INDEPENDENT_AMBULATORY_CARE_PROVIDER_SITE_OTHER): Payer: BC Managed Care – PPO

## 2023-05-09 ENCOUNTER — Other Ambulatory Visit (INDEPENDENT_AMBULATORY_CARE_PROVIDER_SITE_OTHER): Payer: BC Managed Care – PPO

## 2023-05-09 VITALS — BP 131/83 | HR 78 | Temp 98.0°F | Resp 16 | Ht 60.0 in | Wt 115.8 lb

## 2023-05-09 DIAGNOSIS — R7989 Other specified abnormal findings of blood chemistry: Secondary | ICD-10-CM

## 2023-05-09 DIAGNOSIS — K5100 Ulcerative (chronic) pancolitis without complications: Secondary | ICD-10-CM | POA: Diagnosis not present

## 2023-05-09 LAB — HEPATIC FUNCTION PANEL
ALT: 51 U/L — ABNORMAL HIGH (ref 0–35)
AST: 39 U/L — ABNORMAL HIGH (ref 0–37)
Albumin: 3.2 g/dL — ABNORMAL LOW (ref 3.5–5.2)
Alkaline Phosphatase: 167 U/L — ABNORMAL HIGH (ref 39–117)
Bilirubin, Direct: 0 mg/dL (ref 0.0–0.3)
Total Bilirubin: 0.2 mg/dL (ref 0.2–1.2)
Total Protein: 5.6 g/dL — ABNORMAL LOW (ref 6.0–8.3)

## 2023-05-09 MED ORDER — VEDOLIZUMAB 300 MG IV SOLR
300.0000 mg | Freq: Once | INTRAVENOUS | Status: AC
  Administered 2023-05-09: 300 mg via INTRAVENOUS
  Filled 2023-05-09: qty 5

## 2023-05-09 NOTE — Progress Notes (Signed)
Diagnosis: Pancolitis   Provider:  Chilton Greathouse MD  Procedure: IV Infusion  IV Type: Peripheral, IV Location: L Antecubital  Entyvio (Vedolizumab), Dose: 300 mg  Infusion Start Time: 1122  Infusion Stop Time: 1155  Post Infusion IV Care: Peripheral IV Discontinued  Discharge: Condition: Good, Destination: Home . AVS Declined  Performed by:  Rico Ala, LPN

## 2023-05-12 NOTE — H&P (View-Only) (Signed)
Cardiology Office Note:  .   Date:  05/13/2023  ID:  ANAIDA CEJA, DOB 1955-10-31, MRN [id number] PCP: [name], NP  [facility] Providers Cardiologist:  Yvonne Kendall, MD     History of Present Illness: .   Discussed the use of AI scribe software for clinical note transcription with the patient, who gave verbal consent to proceed.  LUCILA POPLAR is a 67 y.o. female with history of HFpEF, atypical chest pain, PSVT, stroke, type 2 diabetes mellitus, anemia, indeterminate colitis, abnormal LFTs, GERD, hiatal hernia, restless leg syndrome, anxiety, and depression, who present for follow-up of abnormal coronary CTA.  I saw her about a month ago at which time Ms. Mcgourty complained of intermittent pain under the left breast radiating to the left arm.  We elected to perform a coronary CTA, which showed severe disease isolated to OM1 branch (CT-FR 0.77).  Today, Ms. Melnichuk complains of continued intermittent episodes of chest discomfort and palpitations. The discomfort is described as an 'electrical charge' sensation, located behind the left breast and radiating to the left arm. These episodes occur at rest and during activity, with no identifiable triggers. The patient also reports an episode of fatigue and dyspnea, severe enough to prevent her from completing a shopping trip. On further questioning, she noted some pain under her left breast that worsens with doing chores around the house. It has even been present at rest at times, particularly when she is anxious about something. She has been taking metoprolol prescribed at our last visit but is unsure as to whether this medication has had any effect on the symptoms. She also reports a persistent headache, which has been longstanding. She notes that she will need to get a refill for her migraine medication.  In addition to these symptoms, Ms. Celli has been dealing with swelling in her feet, managed with as-needed furosemide.  Swelling has been well-controlled since our last visit. She titrates frequency of her furosemide based on the degree of edema.     ROS: See HPI  Studies Reviewed: [name]   EKG Interpretation Date/Time:  Monday May 13 2023 10:37:52 EDT Ventricular Rate:  77 PR Interval:  160 QRS Duration:  76 QT Interval:  374 QTC Calculation: 423 R Axis:   -20  Text Interpretation: Normal sinus rhythm Minimal voltage criteria for LVH, may be normal variant ( R in aVL ) Possible Anterior infarct , age undetermined Abnormal ECG When compared with ECG of 11-Apr-2023 15:25, No significant change was found Confirmed by Denean Pavon, Cristal Deer [phone]) on 05/13/2023 11:27:27 AM    Coronary CTA (05/02/2023): Severe single-vessel CAD with > 70% stenosis involving OM1 (CT-FFR 0.77).  No significant disease involving LMCA, LAD, or RCA.  Coronary calcium score 29.3 (68th percentile).  Risk Assessment/Calculations:             Physical Exam:   VS:  BP 118/78 (BP Location: Left Arm, Patient Position: Sitting)   Pulse 77   Ht 5' (1.524 m)   Wt 114 lb 3.2 oz (51.8 kg)   LMP  (LMP Unknown)   SpO2 99%   BMI 22.30 kg/m    Wt Readings from Last 3 Encounters:  05/13/23 114 lb 3.2 oz (51.8 kg)  05/09/23 115 lb 12.8 oz (52.5 kg)  04/23/23 116 lb 9.6 oz (52.9 kg)    General:  Anxious-appearing woman seated on exam table. Neck: No JVD or HJR. Lungs: Clear to auscultation bilaterally without wheezes or crackles. Heart: Regular rate and rhythm  without murmurs, rubs, or gallops. Abdomen: Soft, nontender, nondistended. Extremities: No lower extremity edema.  ASSESSMENT AND PLAN: .    Coronary artery disease with unstable angina: Ms. Vanblaricum continues to have frequent episodes of chest and left arm pain.  Some of the symptoms are atypical and most likely noncardiac in nature.  However, the discomfort under her left chest that worsens when she is anxious or doing chores around the house is concerning for angina.  Her recent  coronary CTA showed high-grade disease involving a moderate-caliber OM1 branch.  We have discussed continued medical therapy, referral to the ED for further evaluation, and cardiac catheterization; given further escalation of her symptoms, with pain now sometimes present at rest, we have agreed to move forward with cardiac catheterization later this week.  She does not wish to go to the ER at this time.  We will increase metoprolol tartrate to 50 mg twice daily.  I have also provided her with a prescription for sublingual nitroglycerin to be used as needed for chest pain.  I advised her to call 911 or seek immediate medical attention if she has worsening chest pain in the meantime.  We will check a CBC and BMP today in anticipation of catheterization.  PSVT: Intermittent palpitations still present.  Ms. Mise is not sure if addition of metoprolol at our last visit has helped.  We will increase metoprolol to tartrate to 50 mg twice daily.  Chronic HFpEF: Ms. Zambelli appears euvolemic with stable NYHA class II symptoms.  Continue furosemide 20 mg twice daily, which she titrates based on severity of her edema.  History of stroke: No new focal neurologic deficits.  Continue aspirin and statin therapy for secondary prevention.    Informed Consent   Shared Decision Making/Informed Consent The risks [stroke (1 in 1000), death (1 in 1000), kidney failure [usually temporary] (1 in 500), bleeding (1 in 200), allergic reaction [possibly serious] (1 in 200)], benefits (diagnostic support and management of coronary artery disease) and alternatives of a cardiac catheterization were discussed in detail with Ms. Dantes and she is willing to proceed.     Dispo: Return to clinic in 2-3 weeks.  Signed, Yvonne Kendall, MD

## 2023-05-12 NOTE — Progress Notes (Unsigned)
Cardiology Office Note:  .   Date:  05/13/2023  ID:  Amy Hansen, DOB 1956/02/02, MRN [id number] PCP: [name], NP  [facility] Providers Cardiologist:  Yvonne Kendall, MD     History of Present Illness: .   Discussed the use of AI scribe software for clinical note transcription with the patient, who gave verbal consent to proceed.  Amy Hansen is a 67 y.o. female with history of HFpEF, atypical chest pain, PSVT, stroke, type 2 diabetes mellitus, anemia, indeterminate colitis, abnormal LFTs, GERD, hiatal hernia, restless leg syndrome, anxiety, and depression, who present for follow-up of abnormal coronary CTA.  I saw her about a month ago at which time Amy Hansen complained of intermittent pain under the left breast radiating to the left arm.  We elected to perform a coronary CTA, which showed severe disease isolated to OM1 branch (CT-FR 0.77).  Today, Amy Hansen complains of continued intermittent episodes of chest discomfort and palpitations. The discomfort is described as an 'electrical charge' sensation, located behind the left breast and radiating to the left arm. These episodes occur at rest and during activity, with no identifiable triggers. The patient also reports an episode of fatigue and dyspnea, severe enough to prevent her from completing a shopping trip. On further questioning, she noted some pain under her left breast that worsens with doing chores around the house. It has even been present at rest at times, particularly when she is anxious about something. She has been taking metoprolol prescribed at our last visit but is unsure as to whether this medication has had any effect on the symptoms. She also reports a persistent headache, which has been longstanding. She notes that she will need to get a refill for her migraine medication.  In addition to these symptoms, Amy Hansen has been dealing with swelling in her feet, managed with as-needed furosemide.  Swelling has been well-controlled since our last visit. She titrates frequency of her furosemide based on the degree of edema.     ROS: See HPI  Studies Reviewed: [name]   EKG Interpretation Date/Time:  Monday May 13 2023 10:37:52 EDT Ventricular Rate:  77 PR Interval:  160 QRS Duration:  76 QT Interval:  374 QTC Calculation: 423 R Axis:   -20  Text Interpretation: Normal sinus rhythm Minimal voltage criteria for LVH, may be normal variant ( R in aVL ) Possible Anterior infarct , age undetermined Abnormal ECG When compared with ECG of 11-Apr-2023 15:25, No significant change was found Confirmed by Hrishikesh Hoeg, Cristal Deer [phone]) on 05/13/2023 11:27:27 AM    Coronary CTA (05/02/2023): Severe single-vessel CAD with > 70% stenosis involving OM1 (CT-FFR 0.77).  No significant disease involving LMCA, LAD, or RCA.  Coronary calcium score 29.3 (68th percentile).  Risk Assessment/Calculations:             Physical Exam:   VS:  BP 118/78 (BP Location: Left Arm, Patient Position: Sitting)   Pulse 77   Ht 5' (1.524 m)   Wt 114 lb 3.2 oz (51.8 kg)   LMP  (LMP Unknown)   SpO2 99%   BMI 22.30 kg/m    Wt Readings from Last 3 Encounters:  05/13/23 114 lb 3.2 oz (51.8 kg)  05/09/23 115 lb 12.8 oz (52.5 kg)  04/23/23 116 lb 9.6 oz (52.9 kg)    General:  Anxious-appearing woman seated on exam table. Neck: No JVD or HJR. Lungs: Clear to auscultation bilaterally without wheezes or crackles. Heart: Regular rate and rhythm  without murmurs, rubs, or gallops. Abdomen: Soft, nontender, nondistended. Extremities: No lower extremity edema.  ASSESSMENT AND PLAN: .    Coronary artery disease with unstable angina: Amy Hansen continues to have frequent episodes of chest and left arm pain.  Some of the symptoms are atypical and most likely noncardiac in nature.  However, the discomfort under her left chest that worsens when she is anxious or doing chores around the house is concerning for angina.  Her recent  coronary CTA showed high-grade disease involving a moderate-caliber OM1 branch.  We have discussed continued medical therapy, referral to the ED for further evaluation, and cardiac catheterization; given further escalation of her symptoms, with pain now sometimes present at rest, we have agreed to move forward with cardiac catheterization later this week.  She does not wish to go to the ER at this time.  We will increase metoprolol tartrate to 50 mg twice daily.  I have also provided her with a prescription for sublingual nitroglycerin to be used as needed for chest pain.  I advised her to call 911 or seek immediate medical attention if she has worsening chest pain in the meantime.  We will check a CBC and BMP today in anticipation of catheterization.  PSVT: Intermittent palpitations still present.  Amy Hansen is not sure if addition of metoprolol at our last visit has helped.  We will increase metoprolol to tartrate to 50 mg twice daily.  Chronic HFpEF: Amy Hansen appears euvolemic with stable NYHA class II symptoms.  Continue furosemide 20 mg twice daily, which she titrates based on severity of her edema.  History of stroke: No new focal neurologic deficits.  Continue aspirin and statin therapy for secondary prevention.    Informed Consent   Shared Decision Making/Informed Consent The risks [stroke (1 in 1000), death (1 in 1000), kidney failure [usually temporary] (1 in 500), bleeding (1 in 200), allergic reaction [possibly serious] (1 in 200)], benefits (diagnostic support and management of coronary artery disease) and alternatives of a cardiac catheterization were discussed in detail with Amy Hansen and she is willing to proceed.     Dispo: Return to clinic in 2-3 weeks.  Signed, Yvonne Kendall, MD

## 2023-05-13 ENCOUNTER — Ambulatory Visit: Payer: BC Managed Care – PPO | Attending: Internal Medicine | Admitting: Internal Medicine

## 2023-05-13 ENCOUNTER — Encounter: Payer: Self-pay | Admitting: Internal Medicine

## 2023-05-13 VITALS — BP 118/78 | HR 77 | Ht 60.0 in | Wt 114.2 lb

## 2023-05-13 DIAGNOSIS — I471 Supraventricular tachycardia, unspecified: Secondary | ICD-10-CM | POA: Diagnosis not present

## 2023-05-13 DIAGNOSIS — I5032 Chronic diastolic (congestive) heart failure: Secondary | ICD-10-CM | POA: Diagnosis not present

## 2023-05-13 DIAGNOSIS — I25110 Atherosclerotic heart disease of native coronary artery with unstable angina pectoris: Secondary | ICD-10-CM | POA: Diagnosis not present

## 2023-05-13 DIAGNOSIS — Z8673 Personal history of transient ischemic attack (TIA), and cerebral infarction without residual deficits: Secondary | ICD-10-CM

## 2023-05-13 DIAGNOSIS — R079 Chest pain, unspecified: Secondary | ICD-10-CM | POA: Diagnosis not present

## 2023-05-13 MED ORDER — METOPROLOL TARTRATE 50 MG PO TABS: 50.0000 mg | ORAL_TABLET | Freq: Two times a day (BID) | ORAL | 3 refills | Status: DC

## 2023-05-13 MED ORDER — NITROGLYCERIN 0.4 MG SL SUBL: 0.4000 mg | SUBLINGUAL_TABLET | SUBLINGUAL | 3 refills | Status: DC | PRN

## 2023-05-13 NOTE — Addendum Note (Signed)
Addended by: Miryam Mcelhinney A on: 05/13/2023 02:02 PM   Modules accepted: Orders

## 2023-05-13 NOTE — Patient Instructions (Signed)
Medication Instructions:  Your physician recommends the following medication changes.  START TAKING: A prescription has been sent in for Nitroglycerin.  If you have chest pain that doesn't relieve quickly, place one tablet under your tongue and allow it to dissolve.  If no relief after 5 minutes, you may take another pill.  If no relief after 5 minutes, you may take a 3rd dose but you need to call 911 and report to ER immediately.  INCREASE: Metoprolol to 50 mg by mouth twice a day  *If you need a refill on your cardiac medications before your next appointment, please call your pharmacy*   Lab Work: Your provider would like for you to have following labs drawn today (BMP, CBC).  .   Testing/Procedures: Your physician has requested that you have a cardiac catheterization. Cardiac catheterization is used to diagnose and/or treat various heart conditions. Doctors may recommend this procedure for a number of different reasons. The most common reason is to evaluate chest pain. Chest pain can be a symptom of coronary artery disease (CAD), and cardiac catheterization can show whether plaque is narrowing or blocking your heart's arteries. This procedure is also used to evaluate the valves, as well as measure the blood flow and oxygen levels in different parts of your heart. For further information please visit [email or web address] Please follow instruction sheet, as given. Please see instructions below   Follow-Up: At [facility], you and your health needs are our priority.  As part of our continuing mission to provide you with exceptional heart care, we have created designated Provider Care Teams.  These Care Teams include your primary Cardiologist (physician) and Advanced Practice Providers (APPs -  Physician Assistants and Nurse Practitioners) who all work together to provide you with the care you need, when you need it.  We recommend signing up for the patient portal called "MyChart".   Sign up information is provided on this After Visit Summary.  MyChart is used to connect with patients for Virtual Visits (Telemedicine).  Patients are able to view lab/test results, encounter notes, upcoming appointments, etc.  Non-urgent messages can be sent to your provider as well.   To learn more about what you can do with MyChart, go to [email or web address].    Your next appointment:   2-3 week(s)  Provider:   You may see Yvonne Kendall, MD or one of the following Advanced Practice Providers on your designated Care Team:   Nicolasa Ducking, NP Eula Listen, PA-C Cadence Fransico Michael, PA-C Charlsie Quest, NP     [facility] A DEPT OF [place]. [facility] AT [facility] [address] [place] [address] Dept: [phone] Loc: [phone]  ONIE KASPAREK  05/13/2023  You are scheduled for a Cardiac Catheterization on Wednesday, October 16 with Dr. Cristal Deer End.  1. Please arrive at the [facility] Entrance of [facility], [address], [place] [address] at 10:30 AM (This is 1 hour(s) prior to your procedure time).  Proceed to the Check-In Desk directly inside the entrance.  Procedure Parking: Use the entrance off of the [address] side of the hospital. Turn right upon entering and follow the driveway to parking that is directly in front of the [facility] Center. There is no valet parking available at this entrance, however there is an awning directly in front of the Heart & Vascular Center for drop off/ pick up for patients.  Special note: Every effort is  made to have your procedure done on time. Please understand that emergencies sometimes delay scheduled procedures.  2. Diet: Do not eat solid foods after midnight.  The patient may have clear liquids until 5am upon the day of the procedure.  3. Labs: You will need to have blood drawn today (CBC, BMP)  4. Medication instructions in  preparation for your procedure:   Contrast Allergy: No  Hold lasix and Junuvia the morning of procedure   On the morning of your procedure, take your Aspirin 81 mg and any morning medicines NOT listed above.  You may use sips of water.  5. Plan to go home the same day, you will only stay overnight if medically necessary. 6. Bring a current list of your medications and current insurance cards. 7. You MUST have a responsible person to drive you home. 8. Someone MUST be with you the first 24 hours after you arrive home or your discharge will be delayed. 9. Please wear clothes that are easy to get on and off and wear slip-on shoes.  Thank you for allowing [name] to care for you!   -- [facility]

## 2023-05-14 LAB — BASIC METABOLIC PANEL
BUN/Creatinine Ratio: 13 (ref 12–28)
BUN: 10 mg/dL (ref 8–27)
CO2: 23 mmol/L (ref 20–29)
Calcium: 8.7 mg/dL (ref 8.7–10.3)
Chloride: 102 mmol/L (ref 96–106)
Creatinine, Ser: 0.77 mg/dL (ref 0.57–1.00)
Glucose: 114 mg/dL — ABNORMAL HIGH (ref 70–99)
Potassium: 4.1 mmol/L (ref 3.5–5.2)
Sodium: 140 mmol/L (ref 134–144)
eGFR: 86 mL/min/{1.73_m2} (ref >59)

## 2023-05-14 LAB — CBC
Hematocrit: 41.1 % (ref 34.0–46.6)
Hemoglobin: 12.4 g/dL (ref 11.1–15.9)
MCH: 28.5 pg (ref 26.6–33.0)
MCHC: 30.2 g/dL — ABNORMAL LOW (ref 31.5–35.7)
MCV: 95 fL (ref 79–97)
Platelets: 315 10*3/uL (ref 150–450)
RBC: 4.35 x10E6/uL (ref 3.77–5.28)
RDW: 12.7 % (ref 11.7–15.4)
WBC: 5.2 10*3/uL (ref 3.4–10.8)

## 2023-05-15 ENCOUNTER — Other Ambulatory Visit: Payer: Self-pay

## 2023-05-15 ENCOUNTER — Encounter
Admission: RE | Disposition: A | Discharge status: Home / Self Care | Payer: Self-pay | Source: Home / Self Care | Attending: Internal Medicine

## 2023-05-15 ENCOUNTER — Observation Stay
Admission: RE | Admit: 2023-05-15 | Discharge: 2023-05-16 | Disposition: A | Discharge status: Home / Self Care | Payer: BC Managed Care – PPO | Attending: Internal Medicine | Admitting: Internal Medicine

## 2023-05-15 DIAGNOSIS — I25110 Atherosclerotic heart disease of native coronary artery with unstable angina pectoris: Principal | ICD-10-CM

## 2023-05-15 DIAGNOSIS — R9439 Abnormal result of other cardiovascular function study: Secondary | ICD-10-CM

## 2023-05-15 DIAGNOSIS — Z8673 Personal history of transient ischemic attack (TIA), and cerebral infarction without residual deficits: Secondary | ICD-10-CM | POA: Insufficient documentation

## 2023-05-15 DIAGNOSIS — E119 Type 2 diabetes mellitus without complications: Secondary | ICD-10-CM | POA: Insufficient documentation

## 2023-05-15 DIAGNOSIS — I5032 Chronic diastolic (congestive) heart failure: Secondary | ICD-10-CM | POA: Insufficient documentation

## 2023-05-15 HISTORY — PX: LEFT HEART CATH AND CORONARY ANGIOGRAPHY: CATH118249

## 2023-05-15 HISTORY — PX: CORONARY STENT INTERVENTION: CATH118234

## 2023-05-15 LAB — POCT ACTIVATED CLOTTING TIME
Activated Clotting Time: 214 s
Activated Clotting Time: 244 s
Activated Clotting Time: 281 s

## 2023-05-15 LAB — GLUCOSE, CAPILLARY
Glucose-Capillary: 77 mg/dL (ref 70–99)
Glucose-Capillary: 82 mg/dL (ref 70–99)
Glucose-Capillary: 100 mg/dL — ABNORMAL HIGH (ref 70–99)
Glucose-Capillary: 90 mg/dL (ref 70–99)

## 2023-05-15 SURGERY — LEFT HEART CATH AND CORONARY ANGIOGRAPHY
Anesthesia: Moderate Sedation

## 2023-05-15 MED ORDER — MIDAZOLAM HCL 2 MG/2ML IJ SOLN
INTRAMUSCULAR | Status: DC | PRN
  Administered 2023-05-15 (×3): 1 mg via INTRAVENOUS

## 2023-05-15 MED ORDER — ALUM & MAG HYDROXIDE-SIMETH 200-200-20 MG/5ML PO SUSP
ORAL | Status: AC
  Filled 2023-05-15: qty 30

## 2023-05-15 MED ORDER — ASPIRIN 81 MG PO CHEW: 81.0000 mg | CHEWABLE_TABLET | ORAL | Status: DC

## 2023-05-15 MED ORDER — SODIUM CHLORIDE 0.9 % IV SOLN: INTRAVENOUS | Status: AC

## 2023-05-15 MED ORDER — IOHEXOL 300 MG/ML  SOLN
INTRAMUSCULAR | Status: DC | PRN
  Administered 2023-05-15: 160 mL

## 2023-05-15 MED ORDER — ROSUVASTATIN CALCIUM 5 MG PO TABS
5.0000 mg | ORAL_TABLET | Freq: Every day | ORAL | Status: DC
  Administered 2023-05-15 – 2023-05-16 (×2): 5 mg via ORAL
  Filled 2023-05-15 (×2): qty 1

## 2023-05-15 MED ORDER — SODIUM CHLORIDE 0.9% FLUSH
3.0000 mL | Freq: Two times a day (BID) | INTRAVENOUS | Status: DC
  Administered 2023-05-15 – 2023-05-16 (×2): 3 mL via INTRAVENOUS

## 2023-05-15 MED ORDER — PRAMIPEXOLE DIHYDROCHLORIDE 1 MG PO TABS
1.0000 mg | ORAL_TABLET | Freq: Every day | ORAL | Status: DC
  Administered 2023-05-15: 1 mg via ORAL
  Filled 2023-05-15: qty 1

## 2023-05-15 MED ORDER — CLOPIDOGREL BISULFATE 75 MG PO TABS
75.0000 mg | ORAL_TABLET | Freq: Every day | ORAL | Status: DC
  Administered 2023-05-16: 75 mg via ORAL
  Filled 2023-05-15: qty 1

## 2023-05-15 MED ORDER — FENTANYL CITRATE (PF) 100 MCG/2ML IJ SOLN
INTRAMUSCULAR | Status: DC | PRN
  Administered 2023-05-15 (×3): 25 ug via INTRAVENOUS

## 2023-05-15 MED ORDER — SERTRALINE HCL 50 MG PO TABS
50.0000 mg | ORAL_TABLET | Freq: Every day | ORAL | Status: DC
  Administered 2023-05-16: 50 mg via ORAL
  Filled 2023-05-15: qty 1

## 2023-05-15 MED ORDER — ASPIRIN 81 MG PO TBEC
81.0000 mg | DELAYED_RELEASE_TABLET | Freq: Every day | ORAL | Status: DC
  Administered 2023-05-16: 81 mg via ORAL
  Filled 2023-05-15: qty 1

## 2023-05-15 MED ORDER — SODIUM CHLORIDE 0.9 % WEIGHT BASED INFUSION: 1.0000 mL/kg/h | INTRAVENOUS | Status: DC

## 2023-05-15 MED ORDER — LABETALOL HCL 5 MG/ML IV SOLN: 10.0000 mg | INTRAVENOUS | Status: AC | PRN

## 2023-05-15 MED ORDER — SODIUM CHLORIDE 0.9 % IV SOLN: 250.0000 mL | INTRAVENOUS | Status: DC | PRN

## 2023-05-15 MED ORDER — LIDOCAINE HCL 1 % IJ SOLN
INTRAMUSCULAR | Status: AC
  Filled 2023-05-15: qty 20

## 2023-05-15 MED ORDER — INSULIN ASPART 100 UNIT/ML IJ SOLN: 0.0000 [IU] | Freq: Every day | INTRAMUSCULAR | Status: DC

## 2023-05-15 MED ORDER — INSULIN ASPART 100 UNIT/ML IJ SOLN: 0.0000 [IU] | Freq: Three times a day (TID) | INTRAMUSCULAR | Status: DC

## 2023-05-15 MED ORDER — FENTANYL CITRATE (PF) 100 MCG/2ML IJ SOLN
INTRAMUSCULAR | Status: AC
  Filled 2023-05-15: qty 2

## 2023-05-15 MED ORDER — NITROGLYCERIN 1 MG/10 ML FOR IR/CATH LAB
INTRA_ARTERIAL | Status: AC
  Filled 2023-05-15: qty 10

## 2023-05-15 MED ORDER — QUETIAPINE FUMARATE 300 MG PO TABS
300.0000 mg | ORAL_TABLET | Freq: Every day | ORAL | Status: DC
  Administered 2023-05-15: 300 mg via ORAL
  Filled 2023-05-15: qty 1

## 2023-05-15 MED ORDER — VERAPAMIL HCL 2.5 MG/ML IV SOLN
INTRAVENOUS | Status: DC | PRN
  Administered 2023-05-15 (×2): 2.5 mg via INTRA_ARTERIAL

## 2023-05-15 MED ORDER — NITROGLYCERIN 0.4 MG SL SUBL: 0.4000 mg | SUBLINGUAL_TABLET | SUBLINGUAL | Status: DC | PRN

## 2023-05-15 MED ORDER — HYDRALAZINE HCL 20 MG/ML IJ SOLN: 10.0000 mg | INTRAMUSCULAR | Status: AC | PRN

## 2023-05-15 MED ORDER — ONDANSETRON HCL 4 MG/2ML IJ SOLN: 4.0000 mg | Freq: Four times a day (QID) | INTRAMUSCULAR | Status: DC | PRN

## 2023-05-15 MED ORDER — NITROGLYCERIN 1 MG/10 ML FOR IR/CATH LAB
INTRA_ARTERIAL | Status: DC | PRN
  Administered 2023-05-15 (×2): 200 ug via INTRACORONARY

## 2023-05-15 MED ORDER — CLOPIDOGREL BISULFATE 75 MG PO TABS
ORAL_TABLET | ORAL | Status: AC
  Filled 2023-05-15: qty 8

## 2023-05-15 MED ORDER — HEPARIN SODIUM (PORCINE) 1000 UNIT/ML IJ SOLN
INTRAMUSCULAR | Status: DC | PRN
  Administered 2023-05-15 (×2): 3000 [IU] via INTRAVENOUS
  Administered 2023-05-15 (×2): 2500 [IU] via INTRAVENOUS
  Administered 2023-05-15: 1000 [IU] via INTRAVENOUS

## 2023-05-15 MED ORDER — MIDAZOLAM HCL 2 MG/2ML IJ SOLN
INTRAMUSCULAR | Status: AC
  Filled 2023-05-15: qty 2

## 2023-05-15 MED ORDER — HEPARIN SODIUM (PORCINE) 1000 UNIT/ML IJ SOLN
INTRAMUSCULAR | Status: AC
  Filled 2023-05-15: qty 10

## 2023-05-15 MED ORDER — SODIUM CHLORIDE 0.9% FLUSH: 3.0000 mL | INTRAVENOUS | Status: DC | PRN

## 2023-05-15 MED ORDER — HEPARIN (PORCINE) IN NACL 1000-0.9 UT/500ML-% IV SOLN
INTRAVENOUS | Status: AC
  Filled 2023-05-15: qty 1000

## 2023-05-15 MED ORDER — SODIUM CHLORIDE 0.9 % WEIGHT BASED INFUSION
3.0000 mL/kg/h | INTRAVENOUS | Status: DC
  Administered 2023-05-15: 3 mL/kg/h via INTRAVENOUS

## 2023-05-15 MED ORDER — HEPARIN (PORCINE) IN NACL 1000-0.9 UT/500ML-% IV SOLN
INTRAVENOUS | Status: DC | PRN
  Administered 2023-05-15 (×2): 500 mL

## 2023-05-15 MED ORDER — ALPRAZOLAM 0.5 MG PO TABS
0.5000 mg | ORAL_TABLET | Freq: Two times a day (BID) | ORAL | Status: DC | PRN
  Administered 2023-05-15: 0.5 mg via ORAL
  Filled 2023-05-15: qty 1

## 2023-05-15 MED ORDER — ACETAMINOPHEN 325 MG PO TABS
ORAL_TABLET | ORAL | Status: AC
  Filled 2023-05-15: qty 2

## 2023-05-15 MED ORDER — METOPROLOL TARTRATE 50 MG PO TABS
50.0000 mg | ORAL_TABLET | Freq: Two times a day (BID) | ORAL | Status: DC
  Administered 2023-05-15 – 2023-05-16 (×2): 50 mg via ORAL
  Filled 2023-05-15 (×2): qty 1

## 2023-05-15 MED ORDER — ALUM & MAG HYDROXIDE-SIMETH 200-200-20 MG/5ML PO SUSP
ORAL | Status: DC | PRN
  Administered 2023-05-15: 30 mL via ORAL

## 2023-05-15 MED ORDER — CLOPIDOGREL BISULFATE 75 MG PO TABS
ORAL_TABLET | ORAL | Status: DC | PRN
  Administered 2023-05-15: 600 mg via ORAL

## 2023-05-15 MED ORDER — VERAPAMIL HCL 2.5 MG/ML IV SOLN
INTRAVENOUS | Status: AC
  Filled 2023-05-15: qty 2

## 2023-05-15 MED ORDER — ACETAMINOPHEN 325 MG PO TABS
650.0000 mg | ORAL_TABLET | ORAL | Status: DC | PRN
  Administered 2023-05-15 – 2023-05-16 (×2): 650 mg via ORAL
  Filled 2023-05-15: qty 2

## 2023-05-15 MED ORDER — CEPHALEXIN 250 MG PO CAPS
250.0000 mg | ORAL_CAPSULE | Freq: Every day | ORAL | Status: DC
  Administered 2023-05-16: 250 mg via ORAL
  Filled 2023-05-15: qty 1

## 2023-05-15 MED ORDER — FAMOTIDINE 20 MG PO TABS
20.0000 mg | ORAL_TABLET | Freq: Two times a day (BID) | ORAL | Status: DC
  Administered 2023-05-15 – 2023-05-16 (×2): 20 mg via ORAL
  Filled 2023-05-15 (×2): qty 1

## 2023-05-15 SURGICAL SUPPLY — 24 items
BALLN EUPHORA RX 2.0X12 (BALLOONS) ×2
BALLN ~~LOC~~ TREK NEO RX 2.5X12 (BALLOONS) ×2
BALLOON EUPHORA RX 2.0X12 (BALLOONS) IMPLANT
BALLOON ~~LOC~~ TREK NEO RX 2.5X12 (BALLOONS) IMPLANT
CATH 5FR JL3.5 JR4 ANG PIG MP (CATHETERS) IMPLANT
CATH LAUNCHER 5F EBU3.0 (CATHETERS) IMPLANT
CATH LAUNCHER 5F EBU3.5 (CATHETERS) IMPLANT
CATHETER LAUNCHER 5F EBU3.0 (CATHETERS) ×2
DEVICE RAD TR BAND REGULAR (VASCULAR PRODUCTS) IMPLANT
DRAPE BRACHIAL (DRAPES) IMPLANT
GLIDESHEATH SLEND SS 6F .021 (SHEATH) IMPLANT
GUIDEWIRE INQWIRE 1.5J.035X260 (WIRE) IMPLANT
INQWIRE 1.5J .035X260CM (WIRE) ×2
KIT ENCORE 26 ADVANTAGE (KITS) IMPLANT
KIT SYRINGE INJ CVI SPIKEX1 (MISCELLANEOUS) IMPLANT
PACK CARDIAC CATH (CUSTOM PROCEDURE TRAY) ×2 IMPLANT
PROTECTION STATION PRESSURIZED (MISCELLANEOUS) ×2
SET ATX-X65L (MISCELLANEOUS) IMPLANT
SHEATH GLIDE SLENDER 4/5FR (SHEATH) IMPLANT
STATION PROTECTION PRESSURIZED (MISCELLANEOUS) IMPLANT
STENT ONYX FRONTIER 2.25X18 (Permanent Stent) IMPLANT
WIRE G HI TQ BMW 190 (WIRE) IMPLANT
WIRE HITORQ VERSACORE ST 145CM (WIRE) IMPLANT
WIRE RUNTHROUGH IZANAI 014 180 (WIRE) IMPLANT

## 2023-05-15 NOTE — Progress Notes (Signed)
Per Dr. Okey Dupre, ok to stop IV fluids once bag runs out. Encourage patient to drink water

## 2023-05-15 NOTE — Progress Notes (Signed)
Patient admitted to 255 from cath lab; patient is alert and oriented; vital signs are stable and within patient baseline; no signs or symptoms of distress noted; patient placed on cardiac monitor; will continue to monitor and assist as needed.

## 2023-05-15 NOTE — Interval H&P Note (Signed)
History and Physical Interval Note:  05/15/2023 11:42 AM  [name]  has presented today for surgery, with the diagnosis of unstable angina and abnormal cardiac CTA.  The various methods of treatment have been discussed with the patient and family. After consideration of risks, benefits and other options for treatment, the patient has consented to  Procedure(s): LEFT HEART CATH AND CORONARY ANGIOGRAPHY (Left) as a surgical intervention.  The patient's history has been reviewed, patient examined, no change in status, stable for surgery.  I have reviewed the patient's chart and labs.  Questions were answered to the patient's satisfaction.    Cath Lab Visit (complete for each Cath Lab visit)  Clinical Evaluation Leading to the Procedure:   ACS: No.  Non-ACS:    Anginal Classification: CCS IV  Anti-ischemic medical therapy: Minimal Therapy (1 class of medications)  Non-Invasive Test Results: Coronary CTA with significant OM1 disease by CT-FFR  Prior CABG: Previous CABG  Tekisha Darcey

## 2023-05-15 NOTE — Plan of Care (Signed)
  Problem: Tissue Perfusion: Goal: Adequacy of tissue perfusion will improve Outcome: Progressing   Problem: Safety: Goal: Ability to remain free from injury will improve Outcome: Progressing   Problem: Activity: Goal: Ability to return to baseline activity level will improve Outcome: Progressing   Problem: Cardiovascular: Goal: Ability to achieve and maintain adequate cardiovascular perfusion will improve Outcome: Progressing   Problem: Cardiovascular: Goal: Vascular access site(s) Level 0-1 will be maintained Outcome: Progressing   Problem: Clinical Measurements: Goal: Ability to maintain clinical measurements within normal limits will improve Outcome: Progressing

## 2023-05-16 ENCOUNTER — Encounter: Payer: Self-pay | Admitting: Internal Medicine

## 2023-05-16 ENCOUNTER — Other Ambulatory Visit: Payer: Self-pay

## 2023-05-16 DIAGNOSIS — Z8673 Personal history of transient ischemic attack (TIA), and cerebral infarction without residual deficits: Secondary | ICD-10-CM | POA: Diagnosis not present

## 2023-05-16 DIAGNOSIS — I25110 Atherosclerotic heart disease of native coronary artery with unstable angina pectoris: Secondary | ICD-10-CM | POA: Diagnosis not present

## 2023-05-16 DIAGNOSIS — I5032 Chronic diastolic (congestive) heart failure: Secondary | ICD-10-CM | POA: Diagnosis not present

## 2023-05-16 DIAGNOSIS — E119 Type 2 diabetes mellitus without complications: Secondary | ICD-10-CM | POA: Diagnosis not present

## 2023-05-16 LAB — CBC
HCT: 34.5 % — ABNORMAL LOW (ref 36.0–46.0)
Hemoglobin: 11.2 g/dL — ABNORMAL LOW (ref 12.0–15.0)
MCH: 29.3 pg (ref 26.0–34.0)
MCHC: 32.5 g/dL (ref 30.0–36.0)
MCV: 90.3 fL (ref 80.0–100.0)
Platelets: 259 10*3/uL (ref 150–400)
RBC: 3.82 MIL/uL — ABNORMAL LOW (ref 3.87–5.11)
RDW: 14.4 % (ref 11.5–15.5)
WBC: 4.1 10*3/uL (ref 4.0–10.5)
nRBC: 0 % (ref 0.0–0.2)

## 2023-05-16 LAB — BASIC METABOLIC PANEL
Anion gap: 8 (ref 5–15)
BUN: 13 mg/dL (ref 8–23)
CO2: 25 mmol/L (ref 22–32)
Calcium: 8.3 mg/dL — ABNORMAL LOW (ref 8.9–10.3)
Chloride: 106 mmol/L (ref 98–111)
Creatinine, Ser: 0.56 mg/dL (ref 0.44–1.00)
GFR, Estimated: >60 mL/min (ref >60)
Glucose, Bld: 95 mg/dL (ref 70–99)
Potassium: 3.6 mmol/L (ref 3.5–5.1)
Sodium: 139 mmol/L (ref 135–145)

## 2023-05-16 LAB — HEMOGLOBIN A1C
Hgb A1c MFr Bld: 7.1 % — ABNORMAL HIGH (ref 4.8–5.6)
Mean Plasma Glucose: 157.07 mg/dL

## 2023-05-16 LAB — GLUCOSE, CAPILLARY: Glucose-Capillary: 95 mg/dL (ref 70–99)

## 2023-05-16 MED ORDER — CLOPIDOGREL BISULFATE 75 MG PO TABS: 75.0000 mg | ORAL_TABLET | Freq: Every day | ORAL | 3 refills | Status: DC

## 2023-05-16 MED ORDER — SERTRALINE HCL 50 MG PO TABS: 50.0000 mg | ORAL_TABLET | Freq: Every day | ORAL | 0 refills | Status: DC

## 2023-05-16 NOTE — Discharge Summary (Addendum)
Discharge Summary    Patient ID: Amy Hansen MRN: [id number]; DOB: 04/05/1956  Admit date: 05/15/2023 Discharge date: 05/16/2023  Primary Care Provider: [name], NP  Primary Cardiologist: Yvonne Kendall, MD  Primary Electrophysiologist:  None   Discharge Diagnoses    Principal Problem:   Coronary artery disease involving native coronary artery of native heart with unstable angina pectoris [facility])   Diagnostic Studies/Procedures   LHC 05/15/23 Conclusions: Severe single-vessel coronary artery disease with 90% stenosis of proximal OM1.  There is mild-moderate disease involving the LAD and RCA.  Sluggish flow in the distal LAD on initial images resolved with intracoronary nitroglycerin suggesting some element of coronary vasospasm. Normal left ventricular systolic function (LVEF 55-65%) with upper normal filling pressure (LVEDP 15 mmHg). Successful PCI to OM1 using Onyx Frontier 2.25 x 18 mm drug-eluting stent (postdilated to 2.6 mm) with 0% residual stenosis and TIMI-3 flow. Small right radial artery necessitating use of 30F sheath/catheters.  Consider alternative access for future catheterizations, particularly if larger catheters are necessary.   Recommendations: Overnight observation. Dual antiplatelet therapy with aspirin and clopidogrel for at least 6 months. Aggressive secondary prevention of coronary artery disease.  Will continue low-dose rosuvastatin given very low LDL and concern for myopathy with higher intensity statin therapy.   Yvonne Kendall, MD [facility]  Diagnostic Dominance: Right  Intervention   _____________   History of Present Illness     Amy Hansen is a 67 y.o. female with history of HFpEF, atypical chest pain, PSVT, stroke, type 2 diabetes, anemia, indeterminate colitis, abnormality, GERD, hiatal hernia, restless leg syndrome, anxiety/depression, abnormal coronary CTA, and coronary artery disease, who was just hospitalized  for overnight observation after a left heart catheterization were completed on 05/15/2023.  Hospital Course     Consultants:    Ms. Lineman was just seen in clinic 05/13/2023 by Dr. Okey Dupre.  At that time she continued to have intermittent episodes of chest discomfort and palpitations.  The discomfort was described as an electrical charge sensation located on the left breast and radiating to her left arm.  Her episodes occurred with rest and during activity with no identifiable triggers.  She also reported an episode of fatigue and dyspnea that was severe enough to prevent her from completing her recent shopping trip.  She also noted pain under her left breast that worsens with doing chores around the house that have been present at rest at times particularly when she was anxious about something.  She been taking metoprolol as prescribed.  She underwent a coronary CTA 05/02/2023 which evaluated severe single-vessel CAD with greater than 70% stenosis involving the OM1, no significant disease involving LMCA, LAD, RCA.  Calcium score of 29.3 over the 68th percentile.  This show decision making she had opted for a left heart catheterization.  She came into the facility on 05/15/2023 for an elective left heart catheterization for concerns of unstable angina as well as abnormal coronary CTA.  Cath revealed severe single-vessel coronary disease with 90% stenosis of the proximal OM1.  There was mild-moderate disease involving the LAD and RCA.  Sluggish flow in the distal LAD on initial images resolved with intracoronary nitroglycerin suggesting some element of coronary vasospasm.  Normal left ventricular systolic function by visual account and 55 to 65%.  She underwent successful PCI/DES to the OM1 with residual stenosis of 0% and TIMI-3 flow.  The recommendation was for overnight observation, dual antiplatelet therapy with aspirin and clopidogrel for minimum of 6  months and aggressive secondary prevention of coronary  artery disease.  She was continued on low-dose rosuvastatin given very low LDL and concern for myopathy with higher intensity statin therapy.  She was placed on the progressive care unit for overnight observation.  Vital signs have remained stable with occasional soft blood pressure throughout the night while she was sleeping that resolved with waking.  She denied any chest pain or shortness of breath.  She did have a small hematoma to the right radial cath site which required further pressure to be held to that area.  Hematoma resolved and the area was bruised and soft with palpable radial pulse of 2+.  There was no requirement for further initiation of TR band.  Labs remained stable overnight without any further incident.  Patient was evaluated and was recommended for discharge.  Did the patient have an acute coronary syndrome (MI, NSTEMI, STEMI, etc) this admission?:  No                               Did the patient have a percutaneous coronary intervention (stent / angioplasty)?:  Yes.     Cath/PCI Registry Performance & Quality Measures: Aspirin prescribed? - Yes ADP Receptor Inhibitor (Plavix/Clopidogrel, Brilinta/Ticagrelor or Effient/Prasugrel) prescribed (includes medically managed patients)? - Yes High Intensity Statin (Lipitor 40-80mg  or Crestor 20-40mg ) prescribed? - No - on low dose statin due to low LDL and high incidence of myopathy along with history of elevated LFTs For EF <40%, was ACEI/ARB prescribed? - Not Applicable (EF >/= 40%) For EF <40%, Aldosterone Antagonist (Spironolactone or Eplerenone) prescribed? - Not Applicable (EF >/= 40%) Cardiac Rehab Phase II ordered? - Yes  _____________  Physical Exam   Discharge Vitals Blood pressure 110/79, pulse 84, temperature 97.9 F (36.6 C), temperature source Oral, resp. rate 20, height 5' (1.524 m), weight 50.8 kg, SpO2 99%.  Filed Weights   05/15/23 1055  Weight: 50.8 kg    GEN: Well nourished, well developed, in no acute  distress.  HEENT: Grossly normal.  Neck: Supple, no JVD, carotid bruits, or masses. Cardiac: RRR, no murmurs, rubs, or gallops. No clubbing, cyanosis, edema.  Radials/DP/PT 2+ and equal bilaterally. Right wrist cath site with large amount of bruising, area is soft without bleeding, small gauze dressing to site Respiratory:  Respirations regular and unlabored, clear to auscultation bilaterally. GI: Soft, nontender, nondistended, BS + x 4. MS: no deformity or atrophy. Skin: warm and dry, no rash. Neuro:  Strength and sensation are intact. Psych: AAOx3.  Normal affect.  Labs & Radiologic Studies    CBC Recent Labs    05/13/23 1143 05/16/23 0329  WBC 5.2 4.1  HGB 12.4 11.2*  HCT 41.1 34.5*  MCV 95 90.3  PLT 315 259   Basic Metabolic Panel Recent Labs    16/10/96 1143 05/16/23 0329  NA 140 139  K 4.1 3.6  CL 102 106  CO2 23 25  GLUCOSE 114* 95  BUN 10 13  CREATININE 0.77 0.56  CALCIUM 8.7 8.3*   Liver Function Tests No results for input(s): "AST", "ALT", "ALKPHOS", "BILITOT", "PROT", "ALBUMIN" in the last 72 hours. No results for input(s): "LIPASE", "AMYLASE" in the last 72 hours. High Sensitivity Troponin:   No results for input(s): "TROPONINIHS" in the last 720 hours.  BNP Invalid input(s): "POCBNP" D-Dimer No results for input(s): "DDIMER" in the last 72 hours. Hemoglobin A1C No results for input(s): "HGBA1C" in the last 72  hours. Fasting Lipid Panel No results for input(s): "CHOL", "HDL", "LDLCALC", "TRIG", "CHOLHDL", "LDLDIRECT" in the last 72 hours. Thyroid Function Tests No results for input(s): "TSH", "T4TOTAL", "T3FREE", "THYROIDAB" in the last 72 hours.  Invalid input(s): "FREET3" _____________  CARDIAC CATHETERIZATION  Result Date: 05/15/2023 Conclusions: Severe single-vessel coronary artery disease with 90% stenosis of proximal OM1.  There is mild-moderate disease involving the LAD and RCA.  Sluggish flow in the distal LAD on initial images resolved  with intracoronary nitroglycerin suggesting some element of coronary vasospasm. Normal left ventricular systolic function (LVEF 55-65%) with upper normal filling pressure (LVEDP 15 mmHg). Successful PCI to OM1 using Onyx Frontier 2.25 x 18 mm drug-eluting stent (postdilated to 2.6 mm) with 0% residual stenosis and TIMI-3 flow. Small right radial artery necessitating use of 74F sheath/catheters.  Consider alternative access for future catheterizations, particularly if larger catheters are necessary. Recommendations: Overnight observation. Dual antiplatelet therapy with aspirin and clopidogrel for at least 6 months. Aggressive secondary prevention of coronary artery disease.  Will continue low-dose rosuvastatin given very low LDL and concern for myopathy with higher intensity statin therapy. Yvonne Kendall, MD [facility]  CT CORONARY FFR DATA PREP & FLUID ANALYSIS  Result Date: 05/02/2023 EXAM: CT FFR ANALYSIS CLINICAL DATA:  Abnormal CCTA FINDINGS: FFRct analysis was performed on the original cardiac CT angiogram dataset. Diagrammatic representation of the FFRct analysis is provided in a separate PDF document in PACS. This dictation was created using the PDF document and an interactive 3D model of the results. 3D model is not available in the EMR/PACS. Normal FFR range is >0.80. 1. Left Main:  No significant stenosis. 2. LAD: No significant stenosis.  FFRct 0.95 3. LCX: significant stenosis in ostial OM1. FFRct 0.77 4. RCA: No significant stenosis.  FFRct 0.92 IMPRESSION: 1. CT FFR analysis showed significant stenosis in the proximal first obtuse marginal branch (OM1). FFRct 0.77 2.  Cardiac catheterization recommended. Electronically Signed   By: Debbe Odea M.D.   On: 05/02/2023 16:58   CT CORONARY MORPH W/CTA COR W/SCORE W/CA W/CM &/OR WO/CM  Result Date: 05/02/2023 CLINICAL DATA:  Chest pain EXAM: Cardiac/Coronary  CTA TECHNIQUE: The patient was scanned on a Siemens Somatom go.Top scanner. : A  retrospective scan was triggered in the ascending thoracic aorta. Axial non-contrast 3 mm slices were carried out through the heart. The data set was analyzed on a dedicated work station and scored using the Agatson method. Gantry rotation speed was 330 msecs and collimation was .6 mm. 100mg  of metoprolol and 0.8 mg of sl NTG was given. The 3D data set was reconstructed in 5% intervals of the 60-95 % of the R-R cycle. Diastolic phases were analyzed on a dedicated work station using MPR, MIP and VRT modes. The patient received 75 cc of contrast. FINDINGS: Aorta: Normal size. Mild aortic wall calcifications. No dissection. Aortic Valve:  Trileaflet.  No calcifications. Coronary Arteries:  Normal coronary origin.  Right dominance. RCA is a dominant artery. There is calcified plaque proximally causing minimal stenosis (<25%). Left main gives rise to LAD and LCX arteries. LM has no disease. LAD has no plaque. LCX is a non-dominant artery. Minimal proximal LCx stenosis (<25%). There is non calcified plaque in the proximal OM1 causing severe stenosis (>70%). Other findings: Normal pulmonary vein drainage into the left atrium. Normal left atrial appendage without a thrombus. Normal size of the pulmonary artery. IMPRESSION: 1. Coronary calcium score of 29.3. This was 68th percentile for age and sex matched control. 2.  Normal coronary origin with right dominance. 3. Severe stenosis of proximal OM1 branch (>70%). 4. Minimal RCA and LCx stenosis (<25%) 5. CAD-RADS 4 Severe stenosis. (70-99% or > 50% left main). Cardiac catheterization is recommended. Consider symptom-guided anti-ischemic pharmacotherapy as well as risk factor modification per guideline directed care. 6. Additional analysis with CT FFR will be submitted and reported separately. Electronically Signed   By: Debbe Odea M.D.   On: 05/02/2023 14:27   Disposition   Pt is being discharged home today in good condition.  Follow-up Plans & Appointments      Follow-up Information     End, Cristal Deer, MD. Schedule an appointment as soon as possible for a visit in 2 week(s).   Specialty: Cardiology Contact information: [address] [place] [address] [phone]                Discharge Instructions     AMB Referral to Cardiac Rehabilitation - Phase II   Complete by: As directed    Diagnosis: Coronary Stents   After initial evaluation and assessments completed: Virtual Based Care may be provided alone or in conjunction with Phase 2 Cardiac Rehab based on patient barriers.: Yes   Intensive Cardiac Rehabilitation (ICR) [facility] location only OR Traditional Cardiac Rehabilitation (TCR) *If criteria for ICR are not met will enroll in TCR [facility] only): Yes   Call MD for:  difficulty breathing, headache or visual disturbances   Complete by: As directed    Call MD for:  extreme fatigue   Complete by: As directed    Call MD for:  persistant dizziness or light-headedness   Complete by: As directed    Call MD for:  persistant nausea and vomiting   Complete by: As directed    Call MD for:  redness, tenderness, or signs of infection (pain, swelling, redness, odor or green/yellow discharge around incision site)   Complete by: As directed    Call MD for:  severe uncontrolled pain   Complete by: As directed    Call MD for:  temperature >100.4   Complete by: As directed    Diet - low sodium heart healthy   Complete by: As directed    Increase activity slowly   Complete by: As directed        Discharge Medications   Allergies as of 05/16/2023       Reactions   Avelox [moxifloxacin Hcl In Nacl] Anaphylaxis   Bactrim [sulfamethoxazole-trimethoprim] Anaphylaxis   Ciprofloxacin Other (See Comments)   Pt states she was told never to take this as it is in the same family as Avelox.    Buspar [buspirone] Other (See Comments)   hallucinations   Neomycin-polymyxin-dexameth Other (See Comments)   Burning in the eyes   Aquaphor  [lanolin-petrolatum] Itching, Rash   Depakote [divalproex Sodium] Other (See Comments)   Unknown Reaction   Imitrex [sumatriptan] Other (See Comments)   Neck and shoulder pain   Stadol [butorphanol] Rash        Medication List     TAKE these medications    ALPRAZolam 0.5 MG tablet Commonly known as: XANAX Take 1 tablet (0.5 mg total) by mouth 2 (two) times daily as needed for anxiety.   aspirin EC 81 MG tablet Take 1 tablet (81 mg) by mouth once daily. Swallow whole.   cephALEXin 250 MG capsule Commonly known as: KEFLEX Take 250 mg by mouth daily.   clopidogrel 75 MG tablet Commonly known as: PLAVIX Take 1 tablet (  75 mg total) by mouth daily with breakfast.   famotidine 20 MG tablet Commonly known as: PEPCID Take 1 tablet (20 mg total) by mouth 2 (two) times daily.   furosemide 20 MG tablet Commonly known as: LASIX Take 20 mg by mouth 2 (two) times daily.   metoprolol tartrate 50 MG tablet Commonly known as: LOPRESSOR Take 1 tablet (50 mg total) by mouth 2 (two) times daily.   nitroGLYCERIN 0.4 MG SL tablet Commonly known as: NITROSTAT Place 1 tablet (0.4 mg total) under the tongue every 5 (five) minutes as needed.   ondansetron 8 MG disintegrating tablet Commonly known as: ZOFRAN-ODT Take 1 tablet (8 mg total) by mouth every 8 (eight) hours as needed for nausea or vomiting.   pramipexole 1 MG tablet Commonly known as: MIRAPEX Take 1 tablet (1 mg total) by mouth at bedtime.   Premarin vaginal cream Generic drug: conjugated estrogens   QUEtiapine 100 MG tablet Commonly known as: SEROQUEL Take 300 mg by mouth at bedtime.   rosuvastatin 5 MG tablet Commonly known as: CRESTOR Take 1 tablet by mouth once daily   sertraline 50 MG tablet Commonly known as: ZOLOFT Take 1 tablet (50 mg total) by mouth daily. What changed:  medication strength how much to take   sitaGLIPtin 100 MG tablet Commonly known as: Januvia Take 1 tablet (100 mg total) by mouth  daily.   vedolizumab 300 MG injection Commonly known as: ENTYVIO Inject into the vein. Every 4 weeks           Outstanding Labs/Studies    Duration of Discharge Encounter   MD Time:   NP/PA Time:  30 minutes  Signed, Rudell Ortman, NP 05/16/2023, 9:15 AM

## 2023-05-16 NOTE — Plan of Care (Signed)
Problem: Education: Goal: Understanding of CV disease, CV risk reduction, and recovery process will improve Outcome: Progressing Goal: Individualized Educational Video(s) Outcome: Progressing   Problem: Activity: Goal: Ability to return to baseline activity level will improve Outcome: Progressing   Problem: Cardiovascular: Goal: Ability to achieve and maintain adequate cardiovascular perfusion will improve Outcome: Progressing Goal: Vascular access site(s) Level 0-1 will be maintained Outcome: Progressing   Problem: Health Behavior/Discharge Planning: Goal: Ability to safely manage health-related needs after discharge will improve Outcome: Progressing   Problem: Education: Goal: Knowledge of General Education information will improve Description: Including pain rating scale, medication(s)/side effects and non-pharmacologic comfort measures Outcome: Progressing   Problem: Health Behavior/Discharge Planning: Goal: Ability to manage health-related needs will improve Outcome: Progressing   Problem: Clinical Measurements: Goal: Ability to maintain clinical measurements within normal limits will improve Outcome: Progressing Goal: Will remain free from infection Outcome: Progressing Goal: Diagnostic test results will improve Outcome: Progressing Goal: Respiratory complications will improve Outcome: Progressing Goal: Cardiovascular complication will be avoided Outcome: Progressing   Problem: Activity: Goal: Risk for activity intolerance will decrease Outcome: Progressing   Problem: Nutrition: Goal: Adequate nutrition will be maintained Outcome: Progressing   Problem: Coping: Goal: Level of anxiety will decrease Outcome: Progressing   Problem: Elimination: Goal: Will not experience complications related to bowel motility Outcome: Progressing Goal: Will not experience complications related to urinary retention Outcome: Progressing   Problem: Pain Managment: Goal:  General experience of comfort will improve Outcome: Progressing   Problem: Safety: Goal: Ability to remain free from injury will improve Outcome: Progressing   Problem: Skin Integrity: Goal: Risk for impaired skin integrity will decrease Outcome: Progressing   Problem: Education: Goal: Ability to describe self-care measures that may prevent or decrease complications (Diabetes Survival Skills Education) will improve Outcome: Progressing Goal: Individualized Educational Video(s) Outcome: Progressing   Problem: Coping: Goal: Ability to adjust to condition or change in health will improve Outcome: Progressing   Problem: Fluid Volume: Goal: Ability to maintain a balanced intake and output will improve Outcome: Progressing   Problem: Health Behavior/Discharge Planning: Goal: Ability to identify and utilize available resources and services will improve Outcome: Progressing Goal: Ability to manage health-related needs will improve Outcome: Progressing   Problem: Metabolic: Goal: Ability to maintain appropriate glucose levels will improve Outcome: Progressing   Problem: Nutritional: Goal: Maintenance of adequate nutrition will improve Outcome: Progressing Goal: Progress toward achieving an optimal weight will improve Outcome: Progressing   Problem: Skin Integrity: Goal: Risk for impaired skin integrity will decrease Outcome: Progressing   Problem: Tissue Perfusion: Goal: Adequacy of tissue perfusion will improve Outcome: Progressing

## 2023-05-18 NOTE — Plan of Care (Signed)
[facility] Tonsillectomy/Adenoidectomy, Postoperative PEDS care plan entered in error.

## 2023-05-20 ENCOUNTER — Other Ambulatory Visit: Payer: Self-pay | Admitting: Physician Assistant

## 2023-05-20 ENCOUNTER — Telehealth: Payer: Self-pay | Admitting: Internal Medicine

## 2023-05-20 LAB — LIPOPROTEIN A (LPA): Lipoprotein (a): 31.8 nmol/L — ABNORMAL HIGH (ref <75.0)

## 2023-05-20 NOTE — Telephone Encounter (Signed)
Patient called stating she hand a stent done last week, and wants to know if she needs to be on a special diet.  She states that every meal she has had since she has had the stent has made her sick. Please advise.

## 2023-05-20 NOTE — Telephone Encounter (Signed)
The patient called inquiring whether, after stent placement, she is required to be on a special diet. The patient stated that prior to the stent, she experienced episodes of a burning sensation in her chest. However, she reported that her symptoms have worsened after eating since the procedure, requiring daily use of Alka-Seltzer. The patient mentioned that since stopping Monjuro, she has regained her appetite but is unsure if her symptoms have improved since she has been eating larger portions. The patient reported that her symptoms were so severe yesterday that she vomited. She denied blood in the vomit but stated that she has fresh blood in her stool when she wipes, which she believes is related to a tear, as she reports that her rectum is tender to the touch.   The nurse informed the patient that we typically recommend a heart-healthy diet after stent placement, but would forward the request to the MD for further recommendations

## 2023-05-21 NOTE — Telephone Encounter (Signed)
Left a message for the patient to call back.  

## 2023-05-21 NOTE — Telephone Encounter (Signed)
The symptoms sound GI in nature.  I recommend that Ms. Nygaard stop taking famotidine and that we prescribe pantoprazole 40 mg daily.  If her symptoms persist after 4 weeks of therapy, we will need to consider abdominal ultrasound and referral to GI.  Yvonne Kendall, MD [facility]

## 2023-05-23 ENCOUNTER — Encounter: Payer: Self-pay | Admitting: *Deleted

## 2023-05-23 ENCOUNTER — Encounter: Payer: BC Managed Care – PPO | Attending: Internal Medicine | Admitting: *Deleted

## 2023-05-23 DIAGNOSIS — Z955 Presence of coronary angioplasty implant and graft: Secondary | ICD-10-CM

## 2023-05-23 MED ORDER — PANTOPRAZOLE SODIUM 40 MG PO TBEC: 40.0000 mg | DELAYED_RELEASE_TABLET | Freq: Every day | ORAL | 0 refills | Status: DC

## 2023-05-23 NOTE — Telephone Encounter (Signed)
Pt made aware of MD's recommendations and verbalized understanding.  Pepcid D/c Protonix 40 mg daily sent to requested pharmacy

## 2023-05-23 NOTE — Progress Notes (Signed)
Virtual orientation call completed today. shehas an appointment on Date: 05/27/2023  for EP eval and gym Orientation.  Documentation of diagnosis can be found in [facility] Date: 05/15/2023 .

## 2023-05-24 ENCOUNTER — Other Ambulatory Visit: Payer: Self-pay | Admitting: Physician Assistant

## 2023-05-26 NOTE — Telephone Encounter (Signed)
Has appt. tomorrow

## 2023-05-27 ENCOUNTER — Encounter: Payer: Self-pay | Admitting: Physician Assistant

## 2023-05-27 ENCOUNTER — Ambulatory Visit (INDEPENDENT_AMBULATORY_CARE_PROVIDER_SITE_OTHER): Payer: BC Managed Care – PPO | Admitting: Physician Assistant

## 2023-05-27 DIAGNOSIS — F411 Generalized anxiety disorder: Secondary | ICD-10-CM | POA: Diagnosis not present

## 2023-05-27 DIAGNOSIS — G4700 Insomnia, unspecified: Secondary | ICD-10-CM

## 2023-05-27 DIAGNOSIS — G2581 Restless legs syndrome: Secondary | ICD-10-CM | POA: Diagnosis not present

## 2023-05-27 DIAGNOSIS — F1320 Sedative, hypnotic or anxiolytic dependence, uncomplicated: Secondary | ICD-10-CM

## 2023-05-27 DIAGNOSIS — F319 Bipolar disorder, unspecified: Secondary | ICD-10-CM | POA: Diagnosis not present

## 2023-05-27 MED ORDER — QUETIAPINE FUMARATE 100 MG PO TABS: 300.0000 mg | ORAL_TABLET | Freq: Every day | ORAL | 0 refills | Status: DC

## 2023-05-27 MED ORDER — ALPRAZOLAM 0.5 MG PO TABS: 0.5000 mg | ORAL_TABLET | Freq: Two times a day (BID) | ORAL | 0 refills | Status: DC | PRN

## 2023-05-27 NOTE — Progress Notes (Signed)
[facility]  Patient ID: Amy Hansen,  MRN: [id number]  PCP: [name], NP  Date of Evaluation: 05/27/2023  Time spent:35 minutes  Chief Complaint:  Chief Complaint   Anxiety; Depression; Follow-up    HISTORY/CURRENT STATUS: For routine med check.    On 05/15/2023, she was admitted d/t CP, had cardiac cath with stent placement. States she feels much better physically. Not as tired. Thinks more clearly. Has more stamina.   This cardiac event has made her even more anxious though. "Knowing that something like that could happen again is scary." Still taking the Xanax as prescribed. We've been weaning off but that has stalled in the past few months for one reason or another. Compared to what she started at, she's on a low dose, hasn't had any more falls, and feels that the xanax is working. "I take it just like it's prescribed." Not having PA but states she probably would if it weren't for the xanax. Mostly feels overwhelmed, a sense of dread.   She's decided to retire.  No extreme sadness, tearfulness, or feelings of hopelessness.  Sleeps well now. ADLs and personal hygiene are normal.   Denies any changes in concentration, making decisions, or remembering things.  Appetite has not changed.  Weight is stable.  Denies suicidal or homicidal thoughts.  Patient denies increased energy with decreased need for sleep, increased talkativeness, racing thoughts, impulsivity or risky behaviors, increased spending, increased libido, grandiosity, increased irritability or anger, paranoia, or hallucinations.  Denies dizziness, syncope, seizures, numbness, tingling, tremor, tics, unsteady gait, slurred speech, confusion. Denies muscle or joint pain, stiffness, or dystonia.  Individual Medical History/ Review of Systems: Changes? :Yes   see HPI and notes on chart.   Past medications for mental health diagnoses include: Trazodone, Risperdal, Zoloft, Lunesta, prazosin, Sonata,  Prozac, Depakote, Lamictal, lithium, Wellbutrin, Xanax, Ambien, carbamazepine, Seroquel, Buspar caused falls and hallucinations, Gabapentin-she doesn't want to take  Allergies: Avelox [moxifloxacin hcl in nacl], Bactrim [sulfamethoxazole-trimethoprim], Ciprofloxacin, Buspar [buspirone], Neomycin-polymyxin-dexameth, Aquaphor [lanolin-petrolatum], Depakote [divalproex sodium], Imitrex [sumatriptan], and Stadol [butorphanol]  Current Medications:  Current Outpatient Medications:    aspirin EC 81 MG tablet, Take 1 tablet (81 mg) by mouth once daily. Swallow whole., Disp: , Rfl:    cephALEXin (KEFLEX) 250 MG capsule, Take 250 mg by mouth daily., Disp: , Rfl:    clopidogrel (PLAVIX) 75 MG tablet, Take 1 tablet (75 mg total) by mouth daily with breakfast., Disp: 30 tablet, Rfl: 3   furosemide (LASIX) 20 MG tablet, Take 20 mg by mouth 2 (two) times daily., Disp: , Rfl:    metoprolol tartrate (LOPRESSOR) 50 MG tablet, Take 1 tablet (50 mg total) by mouth 2 (two) times daily., Disp: 180 tablet, Rfl: 3   nitroGLYCERIN (NITROSTAT) 0.4 MG SL tablet, Place 1 tablet (0.4 mg total) under the tongue every 5 (five) minutes as needed., Disp: 25 tablet, Rfl: 3   ondansetron (ZOFRAN-ODT) 8 MG disintegrating tablet, Take 1 tablet (8 mg total) by mouth every 8 (eight) hours as needed for nausea or vomiting., Disp: 60 tablet, Rfl: 1   pantoprazole (PROTONIX) 40 MG tablet, Take 1 tablet (40 mg total) by mouth daily., Disp: 30 tablet, Rfl: 0   pramipexole (MIRAPEX) 1 MG tablet, Take 1 tablet (1 mg total) by mouth at bedtime., Disp: 90 tablet, Rfl: 1   PREMARIN vaginal cream, , Disp: , Rfl:    rosuvastatin (CRESTOR) 5 MG tablet, Take 1 tablet by mouth once daily, Disp: 90 tablet, Rfl: 1  sertraline (ZOLOFT) 50 MG tablet, Take 1 tablet (50 mg total) by mouth daily., Disp: 30 tablet, Rfl: 0   sitaGLIPtin (JANUVIA) 100 MG tablet, Take 1 tablet (100 mg total) by mouth daily., Disp: 45 tablet, Rfl: 4   vedolizumab (ENTYVIO) 300  MG injection, Inject into the vein. Every 4 weeks, Disp: , Rfl:    ALPRAZolam (XANAX) 0.5 MG tablet, Take 1 tablet (0.5 mg total) by mouth 2 (two) times daily as needed for anxiety., Disp: 30 tablet, Rfl: 0   QUEtiapine (SEROQUEL) 100 MG tablet, Take 3 tablets (300 mg total) by mouth at bedtime., Disp: 30 tablet, Rfl: 0 Medication Side Effects: none  Family Medical/ Social History: Changes?  no  MENTAL HEALTH EXAM:  There were no vitals taken for this visit.There is no height or weight on file to calculate BMI.  General Appearance: Casual and Well Groomed  Eye Contact:  Good  Speech:  Clear and Coherent and Normal Rate  Volume:  Normal  Mood:  Euthymic  Affect:  Congruent  Thought Process:  Goal Directed and Descriptions of Associations: Circumstantial  Orientation:  Full (Time, Place, and Person)  Thought Content: Logical   Suicidal Thoughts:  No  Homicidal Thoughts:  No  Memory:   at her baseline  Judgement:  Good  Insight:  Good  Psychomotor Activity:  Normal  Concentration:  Concentration: Fair and Attention Span: Good  Recall:  Good  Fund of Knowledge: Good  Language: Good  Assets:  Desire for Improvement Financial Resources/Insurance Housing Resilience Transportation   ADL's:  Intact  Cognition: WNL  Prognosis:  Good   Discharge summary from 05/15/2023 admission reviewed.   DIAGNOSES:    ICD-10-CM   1. Generalized anxiety disorder  F41.1     2. Bipolar I disorder (HCC)  F31.9     3. Restless legs syndrome (RLS)  G25.81     4. Insomnia, unspecified type  G47.00     5. Benzodiazepine dependence (HCC)  F13.20      Receiving Psychotherapy: No   RECOMMENDATIONS:  PDMP reviewed.  Xanax filled 05/21/2023. I provided 35 minutes of face to face time during this encounter, including time spent before and after the visit in records review, medical decision making, counseling pertinent to today's visit, and charting.   On discharge, the Zoloft was decreased from  200 mg to 50 mg. I've sent a note to cardiology NP to clarify. I also wonder if Seroquel is safe for her with the cardiac issues. It can lead to prolonged QT interval and I'm not sure if that's a concern or not.   Because of this new health problem, I recommend she stay on the same dose of Xanax.   Continue Xanax 0.5 mg, 1 p.o. twice daily.  DON'T FILL EARLY. EVER. Not due until 10/7. Continue pramipexole 1 mg, 1 p.o. nightly. Continue Seroquel 100 mg, 3 p.o. nightly. (Also discuss w/ cardiology-any concerns of taking it s/p cardiac stent.) Continue Zoloft 50 mg, 1 daily.               Strongly recommend counseling. Return in 4 weeks.  Melony Overly, PA-C

## 2023-05-29 ENCOUNTER — Ambulatory Visit: Payer: BC Managed Care – PPO | Admitting: Medical

## 2023-05-30 ENCOUNTER — Encounter: Payer: Self-pay | Admitting: Internal Medicine

## 2023-05-30 MED ORDER — METOPROLOL TARTRATE 25 MG PO TABS: 25.0000 mg | ORAL_TABLET | Freq: Two times a day (BID) | ORAL | Status: DC

## 2023-05-30 NOTE — Addendum Note (Signed)
Addended by: Sandi Mariscal on: 05/30/2023 03:55 PM   Modules accepted: Orders

## 2023-05-30 NOTE — Telephone Encounter (Signed)
Please let Ms. Cassedy know that I recommend decreasing her metoprolol to 25 mg BID.  If she continues to have hallucinations when she sees Ryan next week, we can consider stopping it.  If she stops it altogether without tapering down, we run the risk of rebound tachycardia.  Yvonne Kendall, MD [facility]

## 2023-05-31 ENCOUNTER — Other Ambulatory Visit: Payer: Self-pay | Admitting: Physician Assistant

## 2023-05-31 DIAGNOSIS — M5416 Radiculopathy, lumbar region: Secondary | ICD-10-CM | POA: Diagnosis not present

## 2023-05-31 NOTE — Progress Notes (Unsigned)
Cardiology Office Note    Date:  06/04/2023   ID:  KEYLIN FERRYMAN, DOB [birth year], MRN [id number]  PCP:  [name], NP  Cardiologist:  Yvonne Kendall, MD  Electrophysiologist:  None   Chief Complaint: Follow-up  History of Present Illness:   Amy Hansen is a 67 y.o. female with history of CAD status post recent PCI/DES to OM1 in 04/2023, HFpEF, PSVT, CVA/TIA, DM2, anemia, indeterminate colitis, elevated LFTs, RLS, hiatal hernia, anxiety, depression, and GERD who presents for follow-up of LHC.  She was previously followed by Dr. Alvino Chapel with echo in 07/2016 showing an EF of 55 to 60%, no regional wall motion abnormalities, grade 1 diastolic dysfunction, mild mitral regurgitation, and a mildly dilated left atrium.  Treadmill MPI in 08/2016 showed no significant ischemia with adequate exercise tolerance, and was overall low risk.  Zio patch in 07/2019 showed a predominant rhythm of sinus with an average rate of 101 bpm, a single episode of NSVT lasting 9 beats, 20 episodes of SVT lasting up to 11 beats, and rare PACs/PVCs.  Patient triggered events corresponded to sinus rhythm with PACs.  Echo from 03/2021 demonstrated an EF of 60 to 65%, no regional wall motion abnormalities, grade 1 diastolic dysfunction, normal RV systolic function and ventricular cavity size, mild aortic valve sclerosis without evidence of stenosis, and an estimated right atrial pressure of 3 mmHg.      Prior MRI of the brain in 2022 demonstrated small chronic cortical/subcortical infarct within the left parietal lobe as well as a small chronic lacunar infarcts within the bilateral basal ganglia and changes consistent with chronic small vessel ischemia.     She was seen in the office in 05/2022 and was without symptoms of angina or decompensation.  She did report a 1 week history of exertional shortness of breath when going up or down a flight of stairs.  She also reported an episode during the summer where she woke up on  the floor after a lamp fell on her.  At that time, CT of the head and MRI of the face showed no acute process.  It was unclear if this was associated with a syncopal episode or not.  There was no cardiac pro or postdrome.  Zio patch in 05/2022 showed a predominant rhythm of sinus with an average rate of 103 bpm (range 74 to 150 bpm in sinus), single episode of NSVT occurred lasting 4 beats with a maximum rate of 222 bpm, 11 episodes of SVT lasting up to 7 beats with a maximum rate of 148 bpm, rare PACs and PVCs, and no sustained arrhythmias or prolonged pauses.  Patient triggered events corresponded to sinus rhythm and blocked PAC.   She was seen at outside ED on 07/16/2022 with intermittent confusion and difficulty saying specific words.  CT of the head showed no acute intracranial abnormality.  High-sensitivity troponin negative.  She followed up with PCP and was found to have UTI.  Subsequent MRI of the brain in 09/2022 showed no evidence of acute intracranial abnormality with redemonstrated tiny chronic cortical infarct within the left parietal lobe, redemonstrated tiny chronic lacunar infarct within the right thalamus, and similar appearance of likely chronic small vessel ischemia.   Echo on 07/24/2022 demonstrated an EF of 50 to 55%, no regional wall motion abnormalities, mild LVH, grade 1 diastolic dysfunction, normal RV systolic function and ventricular cavity size, mildly dilated left atrium, and trivial mitral regurgitation.   She was seen in the office in  07/2022 and continued to report episodes of transient confusion associated with dysarthria without weakness.  CTA head and neck in 11/2022 showed no acute intracranial finding with chronic small vessel ischemic changes.  No intracranial large vessel occlusion or proximal stenosis.  No suspected significant carotid artery stenosis with aortic atherosclerosis noted.   She was admitted to the hospital in 01/2023 with flank pain found to have pan  positive review of systems.  She was treated for AKI presumed to be secondary to dehydration in the setting of dysphagia with nausea and vomiting.  Found to have large amount of stool burden on CT which was likely contributing to her nausea and vomiting.  GI was consulted with very low suspicion for underlying organic GI disorder with symptoms felt to be related to medication side effects and anxiety.  From a CHF perspective, she appeared compensated.  High-sensitivity troponin trended to 51.  She was seen in the office in 02/2023 noting increase in lower extremity edema despite taking 40 mg of furosemide for several weeks.  Her weight was down 22 pounds by our scale when compared to her visit in 07/2022.  She was advised to take furosemide 20 mg twice daily.  Echo on 04/10/2023 showed an EF of 60 to 65%, no regional wall motion abnormalities, grade 1 diastolic dysfunction, normal RV systolic function and ventricular cavity size, mild to moderate mitral regurgitation, moderate mitral annular calcification, mild to moderate tricuspid regurgitation, and an estimated right atrial pressure of 3 mmHg.  She was seen in the office on 04/11/2023 noting intermittent episodes of pain under the left breast that radiated to the left arm.  She was without symptoms of dyspnea and continue to note waxing and waning edema in her lower extremities.  Her weight has been relatively stable.  In the setting of palpitations noted off metoprolol, this was resumed.  Subsequent coronary CTA on 05/02/2023 showed a calcium score of 29.3 which was the 68th percentile.  There was less than 25% LCx and RCA stenosis with severe stenosis estimated at greater than 70% of proximal OM1.  ctFFR of 0.77 in the ostial OM1 with normal ctFFR of the LAD and RCA.  LHC on 05/15/2023 showed severe single-vessel CAD with 90% stenosis of proximal OM1.  There was mild to moderate disease involving the LAD and RCA as outlined below.  Sluggish flow in the distal  LAD on initial images resolved with intracoronary nitroglycerin suggesting some element of coronary vasospasm.  Normal LVEF estimated at 55 to 65% with upper normal filling pressure with an LVEDP of 15 mmHg.  She underwent successful PCI/DES to OM1.  She comes in doing well from a cardiac perspective and is currently without symptoms of angina or cardiac decompensation.  Since undergoing PCI she reports having had 2 episodes of chest discomfort that occurred at rest.  During these episodes she ended up taking 2 sublingual nitroglycerin with subsequent symptomatic improvement.  No associated dyspnea, dizziness, presyncope, or syncope.  She continues to have bilateral ankle swelling, this is improved from prior.  Weight is stable.  No progressive orthopnea.  No falls or symptoms concerning for bleeding.  Adherent to aspirin and clopidogrel.   Labs independently reviewed: 04/2023 - A1c 7.1, LP(a) 31, Hgb 11.2, PLT 259, potassium 3.6, BUN 13, serum creatinine 0.56, albumin 3.2, AST 39, ALT 51 01/2023 - magnesium 2.1, TC 112, TG 192, HDL 44, LDL 37, A1c 6.5 11/2022 - TSH normal  Past Medical History:  Diagnosis Date   Anemia  Anxiety    Arthritis    Blood transfusion without reported diagnosis    Cataract    CHF (congestive heart failure) (HCC)    Chronic kidney disease    UTI, hematuria in urine   Colitis    Crohn's disease (HCC)    Depression    Diabetes (HCC)    Diverticulosis    Frequent headaches    Interstitial cystitis    Recurrent UTI    Restless leg syndrome    TIA (transient ischemic attack) 02/20/2021   Urinary frequency     Past Surgical History:  Procedure Laterality Date   bariatric bypass  2012   BIOPSY  05/03/2020   Procedure: BIOPSY;  Surgeon: Rachael Fee, MD;  Location: [facility] ENDOSCOPY;  Service: Endoscopy;;   CARPAL TUNNEL RELEASE Right 2003   CARPAL TUNNEL RELEASE Right    2008   CHOLECYSTECTOMY  1975   COLONOSCOPY     COLONOSCOPY WITH PROPOFOL N/A  05/03/2020   Procedure: COLONOSCOPY WITH PROPOFOL;  Surgeon: Rachael Fee, MD;  Location: [facility] ENDOSCOPY;  Service: Endoscopy;  Laterality: N/A;   CORONARY STENT INTERVENTION N/A 05/15/2023   Procedure: CORONARY STENT INTERVENTION;  Surgeon: Yvonne Kendall, MD;  Location: [facility] INVASIVE CV LAB;  Service: Cardiovascular;  Laterality: N/A;   CYSTOSCOPY W/ RETROGRADES Bilateral 06/06/2015   Procedure: CYSTOSCOPY WITH RETROGRADE PYELOGRAM;  Surgeon: Jerilee Field, MD;  Location: [facility] ORS;  Service: Urology;  Laterality: Bilateral;   EYE SURGERY Left 07/06/2022   FL INJ LEFT KNEE CT ARTHROGRAM ([facility] HX) Left    1995   GASTRIC BYPASS  2010   HAND SURGERY Left 01/19/2021   Thumb   HEMORRHOID SURGERY  2013   KNEE ARTHROSCOPY Left 1996   LEFT HEART CATH AND CORONARY ANGIOGRAPHY Left 05/15/2023   Procedure: LEFT HEART CATH AND CORONARY ANGIOGRAPHY;  Surgeon: Yvonne Kendall, MD;  Location: [facility] INVASIVE CV LAB;  Service: Cardiovascular;  Laterality: Left;   TONSILLECTOMY     TOTAL ABDOMINAL HYSTERECTOMY W/ BILATERAL SALPINGOOPHORECTOMY      Current Medications: Current Meds  Medication Sig   ALPRAZolam (XANAX) 0.5 MG tablet Take 1 tablet (0.5 mg total) by mouth 2 (two) times daily as needed for anxiety.   aspirin EC 81 MG tablet Take 1 tablet (81 mg) by mouth once daily. Swallow whole.   cephALEXin (KEFLEX) 250 MG capsule Take 250 mg by mouth daily.   clopidogrel (PLAVIX) 75 MG tablet Take 1 tablet (75 mg total) by mouth daily with breakfast.   conjugated estrogens (PREMARIN) vaginal cream 3 (three) times a week   furosemide (LASIX) 20 MG tablet Take 20 mg by mouth 2 (two) times daily.   isosorbide mononitrate (IMDUR) 30 MG 24 hr tablet Take 0.5 tablets (15 mg total) by mouth daily.   methylPREDNISolone (MEDROL DOSEPAK) 4 MG TBPK tablet Take by mouth.   metoprolol tartrate (LOPRESSOR) 25 MG tablet Take 1 tablet (25 mg total) by mouth 2 (two) times daily.   nitroGLYCERIN (NITROSTAT) 0.4  MG SL tablet Place 1 tablet (0.4 mg total) under the tongue every 5 (five) minutes as needed.   ondansetron (ZOFRAN-ODT) 8 MG disintegrating tablet Take 1 tablet (8 mg total) by mouth every 8 (eight) hours as needed for nausea or vomiting.   pantoprazole (PROTONIX) 40 MG tablet Take 1 tablet (40 mg total) by mouth daily.   pramipexole (MIRAPEX) 1 MG tablet Take 1 tablet (1 mg total) by mouth at bedtime.   pregabalin (LYRICA) 25 MG capsule Start taking  Lyrica 25 mg nightly for 1 week, then increase to 25 mg twice daily and continue for neuropathy.   PREMARIN vaginal cream    QUEtiapine (SEROQUEL) 100 MG tablet Take 3 tablets (300 mg total) by mouth at bedtime.   Rimegepant Sulfate 75 MG TBDP Take 75 mg as needed for headache rescue   rOPINIRole (REQUIP) 1 MG tablet Take 1 mg by mouth 2 (two) times daily.   rosuvastatin (CRESTOR) 5 MG tablet Take 1 tablet by mouth once daily   sertraline (ZOLOFT) 50 MG tablet Take 1 tablet (50 mg total) by mouth daily.   sitaGLIPtin (JANUVIA) 100 MG tablet Take 1 tablet (100 mg total) by mouth daily.   vedolizumab (ENTYVIO) 300 MG injection Inject into the vein. Every 4 weeks    Allergies:   Avelox [moxifloxacin hcl in nacl], Bactrim [sulfamethoxazole-trimethoprim], Ciprofloxacin, Buspar [buspirone], Neomycin-polymyxin-dexameth, Aquaphor [lanolin-petrolatum], Depakote [divalproex sodium], Imitrex [sumatriptan], and Stadol [butorphanol]   Social History   Socioeconomic History   Marital status: Married    Spouse name: Not on file   Number of children: Not on file   Years of education: Not on file   Highest education level: Not on file  Occupational History   Not on file  Tobacco Use   Smoking status: Former    Current packs/day: 0.00    Types: Cigarettes    Quit date: 04/25/1975    Years since quitting: 48.1   Smokeless tobacco: Never   Tobacco comments:    quit 40 years ago  Vaping Use   Vaping status: Never Used  Substance and Sexual Activity    Alcohol use: No    Alcohol/week: 0.0 standard drinks of alcohol   Drug use: No   Sexual activity: Not Currently    Birth control/protection: Post-menopausal, Surgical  Other Topics Concern   Not on file  Social History Narrative   Caffeine 5 servings per day.   Social Determinants of Health   Financial Resource Strain: Medium Risk (04/12/2021)   Overall Financial Resource Strain (CARDIA)    Difficulty of Paying Living Expenses: Somewhat hard  Food Insecurity: No Food Insecurity (05/15/2023)   Hunger Vital Sign    Worried About Running Out of Food in the Last Year: Never true    Ran Out of Food in the Last Year: Never true  Transportation Needs: No Transportation Needs (05/15/2023)   PRAPARE - Administrator, Civil Service (Medical): No    Lack of Transportation (Non-Medical): No  Physical Activity: Inactive (04/12/2021)   Exercise Vital Sign    Days of Exercise per Week: 0 days    Minutes of Exercise per Session: 0 min  Stress: No Stress Concern Present (03/06/2022)   [employer] of Occupational Health - Occupational Stress Questionnaire    Feeling of Stress : Only a little  Social Connections: Moderately Integrated (03/06/2022)   Social Connection and Isolation Panel [NHANES]    Frequency of Communication with Friends and Family: More than three times a week    Frequency of Social Gatherings with Friends and Family: More than three times a week    Attends Religious Services: More than 4 times per year    Active Member of [employer] or Organizations: No    Attends Banker Meetings: Never    Marital Status: Married     Family History:  The patient's family history includes Colon cancer in her mother; Heart failure in her sister; Stroke in her father. There is no history of Bladder  Cancer, Kidney disease, Prostate cancer, Kidney cancer, Pancreatic cancer, Esophageal cancer, Stomach cancer, Rectal cancer, or Breast cancer.  ROS:   12-point review of systems  is negative unless otherwise noted in the HPI.   EKGs/Labs/Other Studies Reviewed:    Studies reviewed were summarized above. The additional studies were reviewed today:  LHC 05/15/2023: Conclusions: Severe single-vessel coronary artery disease with 90% stenosis of proximal OM1.  There is mild-moderate disease involving the LAD and RCA.  Sluggish flow in the distal LAD on initial images resolved with intracoronary nitroglycerin suggesting some element of coronary vasospasm. Normal left ventricular systolic function (LVEF 55-65%) with upper normal filling pressure (LVEDP 15 mmHg). Successful PCI to OM1 using Onyx Frontier 2.25 x 18 mm drug-eluting stent (postdilated to 2.6 mm) with 0% residual stenosis and TIMI-3 flow. Small right radial artery necessitating use of 46F sheath/catheters.  Consider alternative access for future catheterizations, particularly if larger catheters are necessary.   Recommendations: Overnight observation. Dual antiplatelet therapy with aspirin and clopidogrel for at least 6 months. Aggressive secondary prevention of coronary artery disease.  Will continue low-dose rosuvastatin given very low LDL and concern for myopathy with higher intensity statin therapy. __________  Coronary 05/02/2023: FINDINGS: Aorta: Normal size. Mild aortic wall calcifications. No dissection.   Aortic Valve:  Trileaflet.  No calcifications.   Coronary Arteries:  Normal coronary origin.  Right dominance.   RCA is a dominant artery. There is calcified plaque proximally causing minimal stenosis (<25%).   Left main gives rise to LAD and LCX arteries. LM has no disease.   LAD has no plaque.   LCX is a non-dominant artery. Minimal proximal LCx stenosis (<25%). There is non calcified plaque in the proximal OM1 causing severe stenosis (>70%).   Other findings:   Normal pulmonary vein drainage into the left atrium.   Normal left atrial appendage without a thrombus.   Normal size of  the pulmonary artery.   IMPRESSION: 1. Coronary calcium score of 29.3. This was 68th percentile for age and sex matched control. 2. Normal coronary origin with right dominance. 3. Severe stenosis of proximal OM1 branch (>70%). 4. Minimal RCA and LCx stenosis (<25%) 5. CAD-RADS 4 Severe stenosis. (70-99% or > 50% left main). Cardiac catheterization is recommended. Consider symptom-guided anti-ischemic pharmacotherapy as well as risk factor modification per guideline directed care. 6. Additional analysis with CT FFR will be submitted and reported separately.   crFFR: 1. Left Main:  No significant stenosis.   2. LAD: No significant stenosis.  FFRct 0.95 3. LCX: significant stenosis in ostial OM1. FFRct 0.77 4. RCA: No significant stenosis.  FFRct 0.92   IMPRESSION: 1. CT FFR analysis showed significant stenosis in the proximal first obtuse marginal branch (OM1). FFRct 0.77 2.  Cardiac catheterization recommended. __________  2D echo 04/10/2023: 1. Left ventricular ejection fraction, by estimation, is 60 to 65%. The  left ventricle has normal function. The left ventricle has no regional  wall motion abnormalities. Left ventricular diastolic parameters are  consistent with Grade I diastolic  dysfunction (impaired relaxation).   2. Right ventricular systolic function is normal. The right ventricular  size is normal. Tricuspid regurgitation signal is inadequate for assessing  PA pressure.   3. The mitral valve is normal in structure. Mild to moderate mitral valve  regurgitation. No evidence of mitral stenosis. Moderate mitral annular  calcification.   4. Tricuspid valve regurgitation is mild to moderate.   5. The aortic valve is tricuspid. Aortic valve regurgitation is not  visualized.  No aortic stenosis is present.   6. The inferior vena cava is normal in size with greater than 50%  respiratory variability, suggesting right atrial pressure of 3 mmHg.  __________  2D echo  07/24/2022: 1. Left ventricular ejection fraction, by estimation, is 50 to 55%. Left  ventricular ejection fraction by 2D MOD biplane is 53.8 %. The left  ventricle has low normal function. The left ventricle has no regional wall  motion abnormalities. There is mild left ventricular hypertrophy. Left ventricular diastolic parameters are consistent with Grade I diastolic dysfunction (impaired relaxation). The average left ventricular global longitudinal strain is -16.8 %.   2. Right ventricular systolic function is normal. The right ventricular size is normal.   3. Left atrial size was mildly dilated.   4. The mitral valve is normal in structure. Trivial mitral valve regurgitation.   5. The aortic valve is tricuspid. Aortic valve regurgitation is not visualized. __________    Luci Bank patch 05/2022:   The patient was monitored for 13 days, 23 hours.   The predominant rhythm was sinus with an average rate of 103 bpm (range 74-150 bpm in sinus).   There were rare PAC's and PVC's.   A single episode of nonsustained ventricular tachycardia occurred, lasting 4 beats with a maximum rate of 222 bpm.   There were 11 supraventricular runs, lasting up to 7 beats with a maximum rate of 148 bpm.   No sustained arrhythmia or prolonged pause was observed.   Patient triggered events correspond to sinus rhythm and blocked PAC.   Predominantly sinus rhythm with elevated average heart rate.  Rare PAC's and PVC's were observed, as well as a few brief episodes of NSVT and PSVT, as detailed above. __________   2D echo 04/20/2021: 1. Left ventricular ejection fraction, by estimation, is 60 to 65%. Left  ventricular ejection fraction by 2D MOD biplane is 64.3 %. The left  ventricle has normal function. The left ventricle has no regional wall  motion abnormalities. Left ventricular  diastolic parameters are consistent with Grade I diastolic dysfunction  (impaired relaxation).   2. Right ventricular systolic function  is normal. The right ventricular  size is normal.   3. The mitral valve is grossly normal. No evidence of mitral valve  regurgitation.   4. The aortic valve is tricuspid. Aortic valve regurgitation is not  visualized. Mild aortic valve sclerosis is present, with no evidence of  aortic valve stenosis.   5. The inferior vena cava is normal in size with greater than 50%  respiratory variability, suggesting right atrial pressure of 3 mmHg.   Comparison(s): LVEF 55-60%.  __________   Luci Bank patch 07/2019: The patient was monitored for 12 days, 14 hours. The predominant rhythm was sinus with an average rate of 101 bpm (range 66 to 150 bpm in sinus). Rare PACs and PVCs were noted. A single episode of nonsustained ventricular tachycardia lasting nine beats occurred, with a maximum rate of 152 bpm. There were 20 episodes of supraventricular tachycardia lasting up to 11 beats with a maximal rate of 176 bpm. No sustained arrhythmia or prolonged pause was observed. Patient triggered events correspond to sinus rhythm with PACs.   Predominantly sinus rhythm with rare PACs and PVCs as well as brief PSVT and NSVT.  Patient triggered events correspond to sinus rhythm with PACs. __________   Treadmill MPI 08/30/2016: Exercise  myocardial perfusion imaging study with no significant  ischemia Normal wall motion, EF estimated at 50% No EKG changes concerning for  ischemia at peak stress or in recovery. Target heart rate achieved Adequate exercise tolerance, exercised for 5:50 min Low risk scan __________   2D echo 08/23/2016: - Left ventricle: The cavity size was normal. Wall thickness was    normal. Systolic function was normal. The estimated ejection    fraction was in the range of 55% to 60%. Wall motion was normal;    there were no regional wall motion abnormalities. Doppler    parameters are consistent with abnormal left ventricular    relaxation (grade 1 diastolic dysfunction).  - Mitral valve:  There was mild regurgitation.  - Left atrium: The atrium was mildly dilated.    EKG: Post procedure EKG from 05/16/2023 is reviewed and shows sinus rhythm with no acute ST-T changes.    Recent Labs: 02/10/2023: Magnesium 2.1 05/09/2023: ALT 51 05/16/2023: BUN 13; Creatinine, Ser 0.56; Hemoglobin 11.2; Platelets 259; Potassium 3.6; Sodium 139  Recent Lipid Panel    Component Value Date/Time   CHOL 112 02/04/2023 1545   CHOL 139 07/17/2016 1408   CHOL 148 01/10/2012 0717   TRIG 192 (H) 02/04/2023 1545   TRIG 99 07/17/2016 1408   TRIG 110 01/10/2012 0717   HDL 44 02/04/2023 1545   HDL 47 01/10/2012 0717   CHOLHDL 3.0 05/01/2022 0943   CHOLHDL 2.0 04/26/2020 0339   VLDL 13 04/26/2020 0339   VLDL 20 07/17/2016 1408   VLDL 22 01/10/2012 0717   LDLCALC 37 02/04/2023 1545   LDLCALC 79 01/10/2012 0717    PHYSICAL EXAM:    VS:  BP 116/72 (BP Location: Left Arm, Patient Position: Sitting, Cuff Size: Normal)   Pulse 81   Ht 5' (1.524 m)   Wt 115 lb 12.8 oz (52.5 kg)   LMP  (LMP Unknown)   SpO2 97%   BMI 22.62 kg/m   BMI: Body mass index is 22.62 kg/m.  Physical Exam Vitals reviewed.  Constitutional:      Appearance: She is well-developed.  HENT:     Head: Normocephalic and atraumatic.  Eyes:     General:        Right eye: No discharge.        Left eye: No discharge.  Neck:     Vascular: No JVD.  Cardiovascular:     Rate and Rhythm: Normal rate and regular rhythm.     Heart sounds: Normal heart sounds, S1 normal and S2 normal. Heart sounds not distant. No midsystolic click and no opening snap. No murmur heard.    No friction rub.  Pulmonary:     Effort: Pulmonary effort is normal. No respiratory distress.     Breath sounds: Normal breath sounds. No decreased breath sounds, wheezing, rhonchi or rales.  Chest:     Chest wall: No tenderness.  Abdominal:     General: There is no distension.  Musculoskeletal:     Cervical back: Normal range of motion.     Comments:  Trivial bilateral ankle edema.  Skin:    General: Skin is warm and dry.     Nails: There is no clubbing.  Neurological:     Mental Status: She is alert and oriented to person, place, and time.  Psychiatric:        Speech: Speech normal.        Behavior: Behavior normal.        Thought Content: Thought content normal.        Judgment: Judgment normal.     Wt Readings from Last 3  Encounters:  06/04/23 115 lb 12.8 oz (52.5 kg)  05/15/23 112 lb (50.8 kg)  05/13/23 114 lb 3.2 oz (51.8 kg)     ASSESSMENT & PLAN:   CAD involving the native coronary arteries without angina: She is doing well and without current symptoms of angina or cardiac decompensation.  She has had 2 episodes of chest discomfort since undergoing PCI that improved with SL NTG x 2 each.  Query potential vasospasm component given LHC findings.  In an effort to minimize exacerbation of lower extremity swelling we will defer addition of calcium channel blocker at this time.  Trial of Imdur 15 mg daily.  Continue DAPT with aspirin and clopidogrel without interruption for a minimum of 6 months dating back to date of PCI (05/15/2023).  Continue aggressive risk factor modification with rosuvastatin and metoprolol.  Okay to participate with cardiac rehab.  No indication for further ischemic testing at this time.  HFpEF: She appears euvolemic and well compensated with NYHA class II symptoms.  She remains on furosemide 20 mg twice daily with stable labs.  Recommend leg elevation and compression socks to help with lower extremity swelling.  Palpitations with PSVT: Remains on Lopressor 25 mg twice daily rather than previous titration of 50 mg twice daily at last visit.  Overall stable.  History of CVA: No new deficits.  Remains on aspirin and statin therapy.  Elevated LFTs: Improving.    Disposition: F/u with Dr. Okey Dupre or an APP in 2 months.   Medication Adjustments/Labs and Tests Ordered: Current medicines are reviewed at length  with the patient today.  Concerns regarding medicines are outlined above. Medication changes, Labs and Tests ordered today are summarized above and listed in the Patient Instructions accessible in Encounters.   Signed, Eula Listen, PA-C 06/04/2023 4:35 PM     [facility] [place] [address] [place], [place] [address] [phone]

## 2023-06-03 DIAGNOSIS — R200 Anesthesia of skin: Secondary | ICD-10-CM | POA: Diagnosis not present

## 2023-06-03 DIAGNOSIS — G629 Polyneuropathy, unspecified: Secondary | ICD-10-CM | POA: Diagnosis not present

## 2023-06-03 DIAGNOSIS — R296 Repeated falls: Secondary | ICD-10-CM | POA: Diagnosis not present

## 2023-06-03 DIAGNOSIS — G459 Transient cerebral ischemic attack, unspecified: Secondary | ICD-10-CM | POA: Diagnosis not present

## 2023-06-04 ENCOUNTER — Other Ambulatory Visit: Payer: Self-pay | Admitting: Physician Assistant

## 2023-06-04 ENCOUNTER — Encounter: Payer: Self-pay | Admitting: Physician Assistant

## 2023-06-04 ENCOUNTER — Ambulatory Visit: Payer: BC Managed Care – PPO | Attending: Medical | Admitting: Physician Assistant

## 2023-06-04 VITALS — BP 116/72 | HR 81 | Ht 60.0 in | Wt 115.8 lb

## 2023-06-04 DIAGNOSIS — I2510 Atherosclerotic heart disease of native coronary artery without angina pectoris: Secondary | ICD-10-CM | POA: Diagnosis not present

## 2023-06-04 DIAGNOSIS — Z8673 Personal history of transient ischemic attack (TIA), and cerebral infarction without residual deficits: Secondary | ICD-10-CM | POA: Diagnosis not present

## 2023-06-04 DIAGNOSIS — I5032 Chronic diastolic (congestive) heart failure: Secondary | ICD-10-CM | POA: Diagnosis not present

## 2023-06-04 DIAGNOSIS — R7989 Other specified abnormal findings of blood chemistry: Secondary | ICD-10-CM

## 2023-06-04 DIAGNOSIS — I471 Supraventricular tachycardia, unspecified: Secondary | ICD-10-CM

## 2023-06-04 MED ORDER — ISOSORBIDE MONONITRATE ER 30 MG PO TB24: 15.0000 mg | ORAL_TABLET | Freq: Every day | ORAL | 3 refills | Status: DC

## 2023-06-04 NOTE — Patient Instructions (Signed)
Medication Instructions:  Your physician recommends the following medication changes.  START TAKING: Imdur 15 mg daily  *If you need a refill on your cardiac medications before your next appointment, please call your pharmacy*   Lab Work: None ordered   Follow-Up: At [facility], you and your health needs are our priority.  As part of our continuing mission to provide you with exceptional heart care, we have created designated Provider Care Teams.  These Care Teams include your primary Cardiologist (physician) and Advanced Practice Providers (APPs -  Physician Assistants and Nurse Practitioners) who all work together to provide you with the care you need, when you need it.  We recommend signing up for the patient portal called "MyChart".  Sign up information is provided on this After Visit Summary.  MyChart is used to connect with patients for Virtual Visits (Telemedicine).  Patients are able to view lab/test results, encounter notes, upcoming appointments, etc.  Non-urgent messages can be sent to your provider as well.   To learn more about what you can do with MyChart, go to [email or web address].    Your next appointment:   2 month(s)  Provider:   You may see Yvonne Kendall, MD or one of the following Advanced Practice Providers on your designated Care Team:   Eula Listen, [place]

## 2023-06-05 ENCOUNTER — Telehealth: Payer: Self-pay | Admitting: Psychiatry

## 2023-06-05 ENCOUNTER — Other Ambulatory Visit: Payer: Self-pay | Admitting: Physician Assistant

## 2023-06-05 ENCOUNTER — Telehealth: Payer: Self-pay | Admitting: Physician Assistant

## 2023-06-05 NOTE — Telephone Encounter (Signed)
PT lvm asking for a refill on her seroquel. She had a  heart catherization and and a stint put in. The pharmacy needs to know if it is ok to give her this medication.

## 2023-06-06 ENCOUNTER — Encounter: Payer: Self-pay | Admitting: Gastroenterology

## 2023-06-06 ENCOUNTER — Ambulatory Visit (INDEPENDENT_AMBULATORY_CARE_PROVIDER_SITE_OTHER): Payer: BC Managed Care – PPO | Admitting: Gastroenterology

## 2023-06-06 ENCOUNTER — Other Ambulatory Visit (INDEPENDENT_AMBULATORY_CARE_PROVIDER_SITE_OTHER): Payer: BC Managed Care – PPO

## 2023-06-06 ENCOUNTER — Telehealth: Payer: Self-pay | Admitting: Physician Assistant

## 2023-06-06 ENCOUNTER — Ambulatory Visit: Payer: BC Managed Care – PPO

## 2023-06-06 ENCOUNTER — Other Ambulatory Visit: Payer: Self-pay

## 2023-06-06 VITALS — BP 118/62 | HR 82 | Ht 60.0 in | Wt 120.0 lb

## 2023-06-06 DIAGNOSIS — K523 Indeterminate colitis: Secondary | ICD-10-CM

## 2023-06-06 DIAGNOSIS — Z7902 Long term (current) use of antithrombotics/antiplatelets: Secondary | ICD-10-CM | POA: Diagnosis not present

## 2023-06-06 DIAGNOSIS — R7989 Other specified abnormal findings of blood chemistry: Secondary | ICD-10-CM

## 2023-06-06 LAB — HEPATIC FUNCTION PANEL
ALT: 136 U/L — ABNORMAL HIGH (ref 0–35)
AST: 92 U/L — ABNORMAL HIGH (ref 0–37)
Albumin: 3.1 g/dL — ABNORMAL LOW (ref 3.5–5.2)
Alkaline Phosphatase: 199 U/L — ABNORMAL HIGH (ref 39–117)
Bilirubin, Direct: 0.1 mg/dL (ref 0.0–0.3)
Total Bilirubin: 0.3 mg/dL (ref 0.2–1.2)
Total Protein: 5.9 g/dL — ABNORMAL LOW (ref 6.0–8.3)

## 2023-06-06 MED ORDER — QUETIAPINE FUMARATE 300 MG PO TABS: 300.0000 mg | ORAL_TABLET | Freq: Every day | ORAL | 0 refills | Status: DC

## 2023-06-06 MED ORDER — ALPRAZOLAM 0.5 MG PO TABS: 0.5000 mg | ORAL_TABLET | Freq: Two times a day (BID) | ORAL | 0 refills | Status: DC | PRN

## 2023-06-06 NOTE — Patient Instructions (Addendum)
If your blood pressure at your visit was 140/90 or greater, please contact your primary care physician to follow up on this. _______________________________________________________  If you are age 65 or older, your body mass index should be between 23-30. Your Body mass index is 23.44 kg/m. If this is out of the aforementioned range listed, please consider follow up with your Primary Care Provider.  If you are age 60 or younger, your body mass index should be between 19-25. Your Body mass index is 23.44 kg/m. If this is out of the aformentioned range listed, please consider follow up with your Primary Care Provider.  ______________________________________________________  The [facility] [place] [facility] providers would like to encourage you to use [facility] to communicate with providers for non-urgent requests or questions.  Due to long hold times on the telephone, sending your provider a message by [facility] may be a faster and more efficient way to get a response.  Please allow 48 business hours for a response.  Please remember that this is for non-urgent requests.  _______________________________________________________   Please go to the lab in the basement of our building to have lab work done as you leave today. Hit "B" for basement when you get on the elevator.  When the doors open the lab is on your left.  We will call you with the results. Thank you.  Continue Entyvio  Please follow up in 6 months.  Thank you for entrusting me with your care and for choosing [facility], Dr. Ileene Patrick

## 2023-06-06 NOTE — Telephone Encounter (Signed)
Pt called at 2:25p.  She said she gets 2 wks of Alprazolam and she gets 10 days of Seroquel at a time.  She said she is going out of town on Sunday and is asking for the refills on both medicines to be sent before she leaves town.  Send to   [facility] - [place], [place] - [address], [place] [address] Phone: [phone]  Fax: [phone]   Next appt 12/3

## 2023-06-06 NOTE — Progress Notes (Signed)
HPI :  67 y/o female here for a follow up visit:   Colitis History: Admitted October 2021 for iron deficiency anemia and diarrhea/colitis.  She had an infectious work-up performed which was negative.  CT scan showed pancolitis at the time.  She underwent a colonoscopy with Dr. Christella Hartigan showing focal active nonspecific ileitis (on biopsies only - endoscopically ileum was normal), patchy nonspecific colitis in the right and left colon on biopsies, pancolonic inflammation grossly.  She was started on prednisone and then transitioned eventually to Lialda maintenance therapy. Colonoscopy with me on March 2022 for surveillance, showed a normal ileum, but with pancolitis that is active, worse in the left colon.  Biopsies showed chronic active colitis throughout.  She was hospitalized 7/13 through 02/10/2021 with acute exacerbation of diarrhea.  There was suspicion for C. difficile and she had been started on empiric vancomycin just prior to admission as well as prednisone.  GI path panel was negative and C. difficile quick screen also negative.  She did complete a course of vancomycin. Eventually transitioned to [facility] once she got health care insurance. Has responded well to it, had some mild active disease on surveillance colonoscopy 2023 with mildly low Entyvio levels, dosing increased to every 4 weeks.   SINCE THE LAST VISIT:   Patient here is to follow-up for a few different issues.  Recall she has had some fluctuating liver enzymes in recent years.  In 2021 2022 she had mild elevation in alk phos with transaminitis which led to serologic workup.  Equivocal elevation antimitochondrial antibody was repeated and negative.  Unclear etiology, thought to be potential medication reaction at the time, her liver enzymes normalized however over the past 1 to 2 years again they have elevated again.  Lamictal and Seroquel in the past have thought to been potentially related.  Looks like she came off Lamictal.  She had  an ultrasound and then an MRCP this past April which showed mild CBD dilation in the setting of postcholecystectomy but otherwise negative.  She has had other imaging for other issues ongoing over the past few months, last had imaging of her liver in July and was normal.  We had discussed doing a liver biopsy and she was thinking about that.  Since have last seen her in October she was having chest pain, seen by cardiology and had an abnormal CTA which led to cardiac catheterization and she had a stent placed in her coronary artery within the last 2 weeks.  She is now on aspirin and Plavix, told she can stop Plavix for the next 6 months or so.  She has been recovering from that.  She states she was a bit weary of considering a liver biopsy to begin with but now that she is on antiplatelet therapy that can be stopped it is not an option.  From looking at her med list she continues to take Seroquel amongst numerous other medicines without.  She has had what she thinks are several changes to her regimen over the past year.  She is no longer on Mounjaro, now on Januvia.  Otherwise she has been maintained on Entyvio every 4 weeks and states her bowel symptoms are pretty well-controlled.  If anything she has constipation at times.  She had a fecal calprotectin checked in July and that was 62.  At time of diagnosis of her colitis she was almost 6000.  She tolerates the regimen and is doing well in this regard. Recall that she has been  on IV iron in the past for iron deficiency.  She has a history of Roux-en-Y gastric bypass, she has a history of marginal ulcers there, colonoscopy had last showed interval healing.  Iron deficiency thought to be related to marginal ulcers in her gastric bypass status.   She has been on omeprazole capsules 40 mg opened prior to ingestion daily.  Hemoglobin has been stable in recent months   Prior work-up  Colonoscopy 05/03/2020: - Mild to moderate inflammation from anus to cecum  with a normal appearing terminal ileum. Multiple biopsies taken.   A. SMALL BOWEL, TERMINAL ILEUM, BIOPSY:  - Focally active nonspecific ileitis  - Negative for features of chronicity or granulomas   B. COLON, RIGHT, BIOPSY:  - Patchy active nonspecific colitis with focal ulceration  - Focal microscopic aggregate of epithelioid histiocytes suggestive of a  microadenoma  - See comment   C. COLON, LEFT, BIOPSY:  - Patchy mildly active nonspecific colitis  - Negative for features of chronicity or granulomas  COMMENT:   A, B and C.   Taken together, the ileocolonic biopsies show patchy  active colitis with microgranulomas within the right colon.  Diagnostic  histologic features of idiopathic inflammatory bowel disease, including  lamina propria and basal lymphoplasmacytosis or crypt architectural  distortion are not present.  Differential diagnosis can include  infection, drug effect and evolving/early inflammatory bowel disease  (e.g. Crohn's disease).  AFB and GMS special stains are negative for  acid-fast bacilli and fungal organisms respectively.  Clinical-radiologic correlation and patient follow-up is suggested.      CTAP with contrast 04/21/2020: 1. Diffuse circumferential wall thickening of virtually all of the colon, most notably at the level of the cecum and ascending colon, consistent with infectious or inflammatory colitis. 2. There is a punctate focus of gas within the urinary bladder. Correlate for history of recent instrumentation.  Aortic Atherosclerosis      Colonoscopy 10/06/20 - The perianal and digital rectal examinations were normal. - The terminal ileum appeared normal. - Patchy inflammation characterized by erosions, granularity, mucus and aphthous ulcerations was found in the entire colon, most prominent in the left colon. Biopsies were taken with a cold forceps for histology. - Scattered medium-mouthed diverticula were found in the entire colon. - The  exam was otherwise without abnormality.   1. Surgical [P], random right sided sites - CHRONIC ACTIVE COLITIS - NO GRANULOMATA, DYSPLASIA OR MALIGNANCY IDENTIFIED 2. Surgical [P], colon, transverse - CHRONIC ACTIVE COLITIS - NO GRANULOMATA, DYSPLASIA OR MALIGNANCY IDENTIFIED 3. Surgical [P], random left sided sites - CHRONIC ACTIVE COLITIS - NO GRANULOMATA, DYSPLASIA OR MALIGNANCY IDENTIFIED       Colonoscopy 12/26/21: - The perianal and digital rectal examinations were normal. - The terminal ileum appeared normal. - Multiple small-mouthed diverticula were found in the entire colon. - Localized mild inflammation characterized by erosions and erythema was found in the transverse colon in one very focal area. Biopsies were taken with a cold forceps for histology. - A 4 mm polyp was found in the distal rectum. The polyp was sessile. The polyp was removed with a cold biopsy forceps. Resection and retrieval were complete. - The exam was otherwise without abnormality in regards to inflammatory burden. No overt inflammation seen elsewhere. There was significant residual stool. The prep was adequate enough to assess for inflammatory changes but small or flat lesions may not have been appreciated in some portions of the colon. - Biopsies were taken with a cold forceps for histology in  the right, transverse, and left colon.   1. Surgical [P], random right colon sites - COLONIC MUCOSA WITH NO SPECIFIC HISTOPATHOLOGIC CHANGES - NEGATIVE FOR ACUTE INFLAMMATION, FEATURES OF CHRONICITY, GRANULOMAS OR DYSPLASIA 2. Surgical [P], colon, transverse - INACTIVE CHRONIC COLITIS, MANIFESTED ONLY BY FOCAL MILD ARCHITECTURAL CHANGES - NEGATIVE FOR GRANULOMAS OR DYSPLASIA 3. Surgical [P], random left colon sites - COLONIC MUCOSA WITH NO SPECIFIC HISTOPATHOLOGIC CHANGES - NEGATIVE FOR ACUTE INFLAMMATION, FEATURES OF CHRONICITY, GRANULOMAS OR DYSPLASIA 4. Surgical [P], colon, rectum, polyp (1) - HYPERPLASTIC  POLYP     Entyvio levels 01/17/22 - level of 7.7, AB undetectable - increased dosing to q 4 weeks this past summer- July   EGD 05/02/22: - The exam of the esophagus was otherwise normal. - Evidence of a Roux-en-Y gastrojejunostomy was found. The gastrojejunal anastomosis was characterized by 2 small areas of ulceration. - The gastric pouch was otherwise small but normal. - Biopsies were taken with a cold forceps for Helicobacter pylori testing. - The examined small bowel limb and blind loop were normal.   Fecal calprotectin 09/25/22 - 59   RUQ [name] 09/15/22 - normal   MRCP 10/30/22: IMPRESSION: Prior cholecystectomy.   Mild biliary ductal dilatation, with common bile duct measuring 12 mm. No radiographic evidence of choledocholithiasis, stricture, or other obstructing etiology.     Past Medical History:  Diagnosis Date   Anemia    Anxiety    Arthritis    Blood transfusion without reported diagnosis    Cataract    CHF (congestive heart failure) (HCC)    Chronic kidney disease    UTI, hematuria in urine   Colitis    Crohn's disease (HCC)    Depression    Diabetes (HCC)    Diverticulosis    Frequent headaches    Interstitial cystitis    Recurrent UTI    Restless leg syndrome    TIA (transient ischemic attack) 02/20/2021   Urinary frequency      Past Surgical History:  Procedure Laterality Date   bariatric bypass  2012   BIOPSY  05/03/2020   Procedure: BIOPSY;  Surgeon: Rachael Fee, MD;  Location: [facility] ENDOSCOPY;  Service: Endoscopy;;   CARPAL TUNNEL RELEASE Right 2003   CARPAL TUNNEL RELEASE Right    2008   CHOLECYSTECTOMY  1975   COLONOSCOPY     COLONOSCOPY WITH PROPOFOL N/A 05/03/2020   Procedure: COLONOSCOPY WITH PROPOFOL;  Surgeon: Rachael Fee, MD;  Location: [facility] ENDOSCOPY;  Service: Endoscopy;  Laterality: N/A;   CORONARY STENT INTERVENTION N/A 05/15/2023   Procedure: CORONARY STENT INTERVENTION;  Surgeon: Yvonne Kendall, MD;  Location: [facility] INVASIVE  CV LAB;  Service: Cardiovascular;  Laterality: N/A;   CYSTOSCOPY W/ RETROGRADES Bilateral 06/06/2015   Procedure: CYSTOSCOPY WITH RETROGRADE PYELOGRAM;  Surgeon: Jerilee Field, MD;  Location: [facility] ORS;  Service: Urology;  Laterality: Bilateral;   EYE SURGERY Left 07/06/2022   FL INJ LEFT KNEE CT ARTHROGRAM ([facility] HX) Left    1995   GASTRIC BYPASS  2010   HAND SURGERY Left 01/19/2021   Thumb   HEMORRHOID SURGERY  2013   KNEE ARTHROSCOPY Left 1996   LEFT HEART CATH AND CORONARY ANGIOGRAPHY Left 05/15/2023   Procedure: LEFT HEART CATH AND CORONARY ANGIOGRAPHY;  Surgeon: Yvonne Kendall, MD;  Location: [facility] INVASIVE CV LAB;  Service: Cardiovascular;  Laterality: Left;   TONSILLECTOMY     TOTAL ABDOMINAL HYSTERECTOMY W/ BILATERAL SALPINGOOPHORECTOMY     Family History  Problem Relation Age of Onset  Colon cancer Mother    Stroke Father    Heart failure Sister    Bladder Cancer Neg Hx    Kidney disease Neg Hx    Prostate cancer Neg Hx    Kidney cancer Neg Hx    Pancreatic cancer Neg Hx    Esophageal cancer Neg Hx    Stomach cancer Neg Hx    Rectal cancer Neg Hx    Breast cancer Neg Hx    Social History   Tobacco Use   Smoking status: Former    Current packs/day: 0.00    Types: Cigarettes    Quit date: 04/25/1975    Years since quitting: 48.1   Smokeless tobacco: Never   Tobacco comments:    quit 40 years ago  Vaping Use   Vaping status: Never Used  Substance Use Topics   Alcohol use: No    Alcohol/week: 0.0 standard drinks of alcohol   Drug use: No   Current Outpatient Medications  Medication Sig Dispense Refill   ALPRAZolam (XANAX) 0.5 MG tablet Take 1 tablet (0.5 mg total) by mouth 2 (two) times daily as needed for anxiety. 30 tablet 0   aspirin EC 81 MG tablet Take 1 tablet (81 mg) by mouth once daily. Swallow whole.     cephALEXin (KEFLEX) 250 MG capsule Take 250 mg by mouth daily.     clopidogrel (PLAVIX) 75 MG tablet Take 1 tablet (75 mg total) by mouth daily  with breakfast. 30 tablet 3   conjugated estrogens (PREMARIN) vaginal cream 3 (three) times a week     furosemide (LASIX) 20 MG tablet Take 20 mg by mouth 2 (two) times daily.     isosorbide mononitrate (IMDUR) 30 MG 24 hr tablet Take 0.5 tablets (15 mg total) by mouth daily. 45 tablet 3   methylPREDNISolone (MEDROL DOSEPAK) 4 MG TBPK tablet Take by mouth.     metoprolol tartrate (LOPRESSOR) 25 MG tablet Take 1 tablet (25 mg total) by mouth 2 (two) times daily.     nitroGLYCERIN (NITROSTAT) 0.4 MG SL tablet Place 1 tablet (0.4 mg total) under the tongue every 5 (five) minutes as needed. 25 tablet 3   ondansetron (ZOFRAN-ODT) 8 MG disintegrating tablet Take 1 tablet (8 mg total) by mouth every 8 (eight) hours as needed for nausea or vomiting. 60 tablet 1   pantoprazole (PROTONIX) 40 MG tablet Take 1 tablet (40 mg total) by mouth daily. 30 tablet 0   pramipexole (MIRAPEX) 1 MG tablet Take 1 tablet (1 mg total) by mouth at bedtime. 90 tablet 1   pregabalin (LYRICA) 25 MG capsule Start taking Lyrica 25 mg nightly for 1 week, then increase to 25 mg twice daily and continue for neuropathy.     PREMARIN vaginal cream      QUEtiapine (SEROQUEL) 100 MG tablet Take 3 tablets (300 mg total) by mouth at bedtime. 30 tablet 0   Rimegepant Sulfate 75 MG TBDP Take 75 mg as needed for headache rescue     rOPINIRole (REQUIP) 1 MG tablet Take 1 mg by mouth 2 (two) times daily.     rosuvastatin (CRESTOR) 5 MG tablet Take 1 tablet by mouth once daily 90 tablet 1   sertraline (ZOLOFT) 50 MG tablet Take 1 tablet (50 mg total) by mouth daily. 30 tablet 0   sitaGLIPtin (JANUVIA) 100 MG tablet Take 1 tablet (100 mg total) by mouth daily. 45 tablet 4   vedolizumab (ENTYVIO) 300 MG injection Inject into the vein. Every 4  weeks     No current facility-administered medications for this visit.   Allergies  Allergen Reactions   Avelox [Moxifloxacin Hcl In Nacl] Anaphylaxis   Bactrim [Sulfamethoxazole-Trimethoprim]  Anaphylaxis   Ciprofloxacin Other (See Comments)    Pt states she was told never to take this as it is in the same family as Avelox.    Buspar [Buspirone] Other (See Comments)    hallucinations   Neomycin-Polymyxin-Dexameth Other (See Comments)    Burning in the eyes   Aquaphor [Lanolin-Petrolatum] Itching and Rash   Depakote [Divalproex Sodium] Other (See Comments)    Unknown Reaction   Imitrex [Sumatriptan] Other (See Comments)    Neck and shoulder pain   Stadol [Butorphanol] Rash     Review of Systems: All systems reviewed and negative except where noted in HPI.    CARDIAC CATHETERIZATION  Result Date: 05/15/2023 Conclusions: Severe single-vessel coronary artery disease with 90% stenosis of proximal OM1.  There is mild-moderate disease involving the LAD and RCA.  Sluggish flow in the distal LAD on initial images resolved with intracoronary nitroglycerin suggesting some element of coronary vasospasm. Normal left ventricular systolic function (LVEF 55-65%) with upper normal filling pressure (LVEDP 15 mmHg). Successful PCI to OM1 using Onyx Frontier 2.25 x 18 mm drug-eluting stent (postdilated to 2.6 mm) with 0% residual stenosis and TIMI-3 flow. Small right radial artery necessitating use of 73F sheath/catheters.  Consider alternative access for future catheterizations, particularly if larger catheters are necessary. Recommendations: Overnight observation. Dual antiplatelet therapy with aspirin and clopidogrel for at least 6 months. Aggressive secondary prevention of coronary artery disease.  Will continue low-dose rosuvastatin given very low LDL and concern for myopathy with higher intensity statin therapy. Yvonne Kendall, MD [facility]   Lab Results  Component Value Date   ALT 51 (H) 05/09/2023   AST 39 (H) 05/09/2023   GGT 53 (H) 02/07/2023   ALKPHOS 167 (H) 05/09/2023   BILITOT 0.2 05/09/2023    Lab Results  Component Value Date   NA 139 05/16/2023   CL 106 05/16/2023    K 3.6 05/16/2023   CO2 25 05/16/2023   BUN 13 05/16/2023   CREATININE 0.56 05/16/2023   GFRNONAA >60 05/16/2023   CALCIUM 8.3 (L) 05/16/2023   ALBUMIN 3.2 (L) 05/09/2023   GLUCOSE 95 05/16/2023    Lab Results  Component Value Date   WBC 4.1 05/16/2023   HGB 11.2 (L) 05/16/2023   HCT 34.5 (L) 05/16/2023   MCV 90.3 05/16/2023   PLT 259 05/16/2023      Physical Exam: BP 118/62   Pulse 82   Ht 5' (1.524 m)   Wt 120 lb (54.4 kg)   LMP  (LMP Unknown)   BMI 23.44 kg/m  Constitutional: Pleasant,well-developed, female in no acute distress. Neurological: Alert and oriented to person place and time. Psychiatric: Normal mood and affect. Behavior is normal.   ASSESSMENT: 67 y.o. female here for assessment of the following  1. Elevated LFTs   2. Indeterminate colitis   3. Antiplatelet or antithrombotic long-term use    As above, intermittently elevated over time, negative serologic workup to date.  MRCP without clear cause.  She is on numerous medications for multiple medical problems, could be DILI however unclear on her regimen what would be driving this.  She is now off Lamictal.  Her enzymes have been trending down over several months.  We had discussed consideration for a liver biopsy, she was concerned about bleeding risk.  Now that she  just had PCI with stent placement and on aspirin and Plavix she is not a candidate for liver biopsy right now.  Hopefully her enzymes continue to trend down, will repeat them now to see where they are going.  Will also check alpha 1 antitrypsin as I do not think she has had that in the past, albeit unlikely.  I will also repeat an AMA as she had borderline /equivocal value remotely.  As long as the LAE is continue to downtrend we will continue to monitor.  Once she is available to stop aspirin Plavix can consider liver biopsy if this persists.  May consider elastography over time pending course however imaging has shown no evidence of cirrhosis.   After full discussion of this she agrees.  Her colitis is otherwise pretty well-controlled clinically.  Fecal calprotectin recently normal.  She is feeling well and tolerating Entyvio every 4 weeks.  Will continue the regimen for now.  Follow-up in 6 months or sooner with any issues   PLAN: - lab today - LFTs, alpha one antitrypsin, AMA - can't do a liver biopsy because of aspirin / plavix for coronary stent, will monitor for now and avoid hepatotoxic medications.  - consider elastography pending course - continue Entyvio - fecal calprotectin recently normal and she is asymptomatic - f/u 6 months  Harlin Rain, MD [facility]

## 2023-06-06 NOTE — Telephone Encounter (Signed)
Pended medications. Lvm to rc

## 2023-06-06 NOTE — Telephone Encounter (Signed)
Error

## 2023-06-06 NOTE — Telephone Encounter (Signed)
Please let her know I touched base with her cardiology provider, who said it's fine to stay on the same dose of Seroquel. So we'll change back to 300 mg, 1 at bedtime.  #30, 1 po at bedtime.  No RF.  Also pend Xanax same dose, #30, 1 po bid prn, no RF. Fill 06/08/2023.  Thanks

## 2023-06-07 ENCOUNTER — Encounter: Payer: Self-pay | Admitting: Gastroenterology

## 2023-06-07 ENCOUNTER — Ambulatory Visit: Payer: BC Managed Care – PPO | Admitting: Physician Assistant

## 2023-06-07 ENCOUNTER — Ambulatory Visit (INDEPENDENT_AMBULATORY_CARE_PROVIDER_SITE_OTHER): Payer: BC Managed Care – PPO | Admitting: *Deleted

## 2023-06-07 VITALS — BP 119/78 | HR 81 | Temp 98.6°F | Resp 16 | Ht 60.0 in | Wt 119.2 lb

## 2023-06-07 DIAGNOSIS — K5100 Ulcerative (chronic) pancolitis without complications: Secondary | ICD-10-CM | POA: Diagnosis not present

## 2023-06-07 MED ORDER — VEDOLIZUMAB 300 MG IV SOLR
300.0000 mg | Freq: Once | INTRAVENOUS | Status: AC
Start: 2023-06-07 — End: 2023-06-07
  Administered 2023-06-07: 300 mg via INTRAVENOUS
  Filled 2023-06-07: qty 5

## 2023-06-07 NOTE — Telephone Encounter (Signed)
Reviewed Teresa's note

## 2023-06-07 NOTE — Progress Notes (Signed)
Diagnosis: Crohn's Disease  Provider:  Chilton Greathouse MD  Procedure: IV Infusion  IV Type: Peripheral, IV Location: L Antecubital  Entyvio (Vedolizumab), Dose: 300 mg  Infusion Start Time: 0933 am  Infusion Stop Time: 1020 am  Post Infusion IV Care: Observation period completed and Peripheral IV Discontinued  Discharge: Condition: Good, Destination: Home . AVS Declined  Performed by:  Forrest Moron, RN

## 2023-06-10 ENCOUNTER — Other Ambulatory Visit: Payer: Self-pay | Admitting: Internal Medicine

## 2023-06-10 NOTE — Telephone Encounter (Signed)
LVM

## 2023-06-11 NOTE — Telephone Encounter (Signed)
Relayed information to pt

## 2023-06-12 LAB — ALPHA-1-ANTITRYPSIN: A-1 Antitrypsin, Ser: 154 mg/dL (ref 83–199)

## 2023-06-12 LAB — MITOCHONDRIAL ANTIBODIES: Mitochondrial M2 Ab, IgG: 28.9 U — ABNORMAL HIGH (ref <=20.0)

## 2023-06-13 ENCOUNTER — Other Ambulatory Visit: Payer: Self-pay | Admitting: *Deleted

## 2023-06-13 DIAGNOSIS — R7989 Other specified abnormal findings of blood chemistry: Secondary | ICD-10-CM

## 2023-06-13 DIAGNOSIS — K50119 Crohn's disease of large intestine with unspecified complications: Secondary | ICD-10-CM

## 2023-06-14 ENCOUNTER — Telehealth: Payer: Self-pay | Admitting: Internal Medicine

## 2023-06-14 ENCOUNTER — Other Ambulatory Visit: Payer: Self-pay | Admitting: Internal Medicine

## 2023-06-14 ENCOUNTER — Telehealth: Payer: Self-pay | Admitting: Gastroenterology

## 2023-06-14 NOTE — Telephone Encounter (Signed)
See lab result note from 06/06/23

## 2023-06-14 NOTE — Telephone Encounter (Signed)
Patient is returning you call regarding her lab results. She is requesting a call back. Please advise.

## 2023-06-14 NOTE — Telephone Encounter (Signed)
Left a message for the patient to call back.  

## 2023-06-14 NOTE — Telephone Encounter (Signed)
Pt states she is having swelling in her legs and ankles. She states she had a cath and a stint in and she is in a lot of pain. Please advise

## 2023-06-17 NOTE — Telephone Encounter (Signed)
OK to refill by Dr. Okey Dupre or defer to PCP?  Last office visit 06/04/23  next office visit:  08/05/23

## 2023-06-18 ENCOUNTER — Encounter: Payer: Self-pay | Admitting: Nurse Practitioner

## 2023-06-18 ENCOUNTER — Ambulatory Visit: Payer: BC Managed Care – PPO | Admitting: Nurse Practitioner

## 2023-06-18 ENCOUNTER — Encounter: Payer: Self-pay | Admitting: Oncology

## 2023-06-18 ENCOUNTER — Other Ambulatory Visit (INDEPENDENT_AMBULATORY_CARE_PROVIDER_SITE_OTHER): Payer: BC Managed Care – PPO

## 2023-06-18 ENCOUNTER — Encounter: Payer: Self-pay | Admitting: Gastroenterology

## 2023-06-18 VITALS — BP 118/81 | HR 93 | Temp 97.7°F | Ht 60.0 in | Wt 122.4 lb

## 2023-06-18 DIAGNOSIS — I25110 Atherosclerotic heart disease of native coronary artery with unstable angina pectoris: Secondary | ICD-10-CM

## 2023-06-18 DIAGNOSIS — E559 Vitamin D deficiency, unspecified: Secondary | ICD-10-CM | POA: Diagnosis not present

## 2023-06-18 DIAGNOSIS — D509 Iron deficiency anemia, unspecified: Secondary | ICD-10-CM

## 2023-06-18 DIAGNOSIS — I5032 Chronic diastolic (congestive) heart failure: Secondary | ICD-10-CM

## 2023-06-18 DIAGNOSIS — R7989 Other specified abnormal findings of blood chemistry: Secondary | ICD-10-CM | POA: Diagnosis not present

## 2023-06-18 DIAGNOSIS — K50119 Crohn's disease of large intestine with unspecified complications: Secondary | ICD-10-CM | POA: Diagnosis not present

## 2023-06-18 DIAGNOSIS — E119 Type 2 diabetes mellitus without complications: Secondary | ICD-10-CM

## 2023-06-18 DIAGNOSIS — E1159 Type 2 diabetes mellitus with other circulatory complications: Secondary | ICD-10-CM | POA: Diagnosis not present

## 2023-06-18 DIAGNOSIS — I152 Hypertension secondary to endocrine disorders: Secondary | ICD-10-CM

## 2023-06-18 DIAGNOSIS — E781 Pure hyperglyceridemia: Secondary | ICD-10-CM

## 2023-06-18 DIAGNOSIS — Z7984 Long term (current) use of oral hypoglycemic drugs: Secondary | ICD-10-CM

## 2023-06-18 LAB — HEPATIC FUNCTION PANEL
ALT: 92 U/L — ABNORMAL HIGH (ref 0–35)
AST: 57 U/L — ABNORMAL HIGH (ref 0–37)
Albumin: 3.3 g/dL — ABNORMAL LOW (ref 3.5–5.2)
Alkaline Phosphatase: 204 U/L — ABNORMAL HIGH (ref 39–117)
Bilirubin, Direct: 0.1 mg/dL (ref 0.0–0.3)
Total Bilirubin: 0.4 mg/dL (ref 0.2–1.2)
Total Protein: 6.3 g/dL (ref 6.0–8.3)

## 2023-06-18 MED ORDER — ONDANSETRON 8 MG PO TBDP: 8.0000 mg | ORAL_TABLET | Freq: Three times a day (TID) | ORAL | 1 refills | Status: AC | PRN

## 2023-06-18 NOTE — Assessment & Plan Note (Signed)
Chronic, historic condition She has follow up with Cardiology tomorrow for this  Recommend wearing compression stockings and continuing with Lasix as directed to manage until she can be seen by Cariology for follow up  - Reminded to call for an overnight weight gain of >2 pounds or a weekly weight gain of >5 pounds - not adding salt to food and read food labels. Reviewed the importance of keeping daily sodium intake to 2000mg  daily. - Avoid Ibuprofen products.

## 2023-06-18 NOTE — Assessment & Plan Note (Signed)
Labs ordered at visit today.  Will make recommendations based on lab results.   

## 2023-06-18 NOTE — Telephone Encounter (Signed)
I spoke with the patient, who reports still experiencing bilateral lower extremity edema from her knees down. She stated that she is wearing compression stockings daily and elevating her legs, but the swelling does not resolve. The patient also reported shortness of breath with exertion and a 4-6 pound weight gain overnight. She mentioned that due to the shortness of breath, she has experienced panic attacks today. Additionally, the patient reported on-and-off sharp chest pain, which occurs almost daily. She stated that last week, the chest pain lasted 10 minutes and required two doses of nitroglycerin. The patient was unable to determine any precipitating or alleviating factors but mentioned she is under a lot of stress.  Appointment scheduled for tomorrow 06/19/23 for further evaluations.  Pt also made aware of ED precaution should any new symptoms develop or worsen.

## 2023-06-18 NOTE — Assessment & Plan Note (Signed)
Chronic, Stable. She is checking glucose levels at home irregularly. She has been taking Januvia 100 mg PO every day and appears to be tolerating well- continue current regimen Will recheck A1c in about 2-3 months for monitoring Follow up in 3 months or sooner if concerns arise.

## 2023-06-18 NOTE — Assessment & Plan Note (Signed)
>>  ASSESSMENT AND PLAN FOR DIABETES MELLITUS TYPE 2 IN NONOBESE (HCC) WRITTEN ON 06/18/2023  2:08 PM BY HOLDSWORTH, KAREN, NP  Chronic, Stable. She is checking glucose levels at home irregularly. She has been taking Januvia 100 mg PO every day and appears to be tolerating well- continue current regimen Will recheck A1c in about 2-3 months for monitoring Follow up in 3 months or sooner if concerns arise.

## 2023-06-18 NOTE — Assessment & Plan Note (Signed)
Chronic with levels on lower side recently and some orthostatic changes, suspected related to dehydration due to current N&V.  Recommend to hold Lasix.  Check BP 2 times daily a home and document for visits.  DASH diet at home.  LABS: CBC and CMP.  Blood pressure is controlled.  Recommend increasing water intake and moving with caution when changing positions.

## 2023-06-18 NOTE — Assessment & Plan Note (Signed)
Chronic. Continue with Plavix and rosuvastatin.  Would benefit from increased dose of Rosuvastatin.  Will discuss at next visit.  Labs ordered today.

## 2023-06-18 NOTE — Progress Notes (Signed)
BP 118/81 (BP Location: Left Arm, Patient Position: Sitting, Cuff Size: Normal)   Pulse 93   Temp 97.7 F (36.5 C) (Oral)   Ht 5' (1.524 m)   Wt 122 lb 6.4 oz (55.5 kg)   LMP  (LMP Unknown)   SpO2 97%   BMI 23.90 kg/m    Subjective:    Patient ID: Amy Hansen, female    DOB: [birth year], 67 y.o.   MRN: [id number]  HPI: Amy Hansen is a 67 y.o. female  Chief Complaint  Patient presents with   3 month follow up         Diabetes   Medication Refill    protonix   DIABETES Last A1c 7.1% in October.  Has stopped Mounjaro as made her feel bad, no dose this week -- significant weight loss and abdominal discomfort.  Has been throwing up off and on for one year, has stopped all oral medications recently as was taking and throwing up -- She had an episode of NV yesterday.  Now on Januvia instead of Mounjaro.  Hypoglycemic episodes:no Polydipsia/polyuria: no Visual disturbance: no Chest pain: no Paresthesias: no Glucose Monitoring: yes  Accucheck frequency: once a week  Fasting glucose:  Post prandial:  Evening:  Before meals: Taking Insulin?: no  Long acting insulin:  Short acting insulin: Blood Pressure Monitoring: not checking Retinal Examination: Up to Date -- Dr. Patsi Sears Exam: Up to Date Pneumovax: Up to Date Influenza: Up to Date Aspirin: no   HYPERTENSION / HYPERLIPIDEMIA/HF Currently has Lasix to take and Rosuvastatin. Her weight is fluctuating.  States when she wakes up in the morning she weighs 115-116.   Satisfied with current treatment? yes Duration of hypertension: chronic BP monitoring frequency: rarely BP range:  BP medication side effects: no Duration of hyperlipidemia: chronic Cholesterol medication side effects: no Cholesterol supplements: none Medication compliance: fair compliance Aspirin: no Recent stressors: yes Recurrent headaches: no Visual changes: no Palpitations: no Dyspnea: yes occasional recently Chest pain: no Lower  extremity edema: no Dizzy/lightheaded: yes    DEPRESSION Has Seroquel, Wellbutrin, Lamictal to take at home.  She visits [facility] and they are weaning her off Xanax -- is tolerating this she reports.  Patient states she had two panic attacks this morning and had to take two xanax this morning.  Her medication hasn't been weaned recently.    No longer taking Vitamin D or B12 at home.  Could not tolerate. Mood status: stable Satisfied with current treatment?: yes Symptom severity: moderate  Duration of current treatment : chronic Side effects: no Medication compliance: good compliance Psychotherapy/counseling: yes in the past Depressed mood: yes Anxious mood: yes Anhedonia: yes Significant weight loss or gain: no Insomnia: yes hard to stay asleep Fatigue: yes Feelings of worthlessness or guilt: no Impaired concentration/indecisiveness: no Suicidal ideations: no Hopelessness: no Crying spells: yes    01/04/2023    2:54 PM 12/31/2022    2:13 PM 07/18/2022    8:48 AM 01/18/2022    3:24 PM 08/01/2021    9:34 AM  Depression screen PHQ 2/9  Decreased Interest 2 2 1 2  0  Down, Depressed, Hopeless 3 3 1 2  0  PHQ - 2 Score 5 5 2 4  0  Altered sleeping 3 3 3 2 1   Tired, decreased energy 2 2 3 3 3   Change in appetite 3 3 1 3 1   Feeling bad or failure about yourself  3 3 0 3 0  Trouble concentrating 3 3  3 3 3   Moving slowly or fidgety/restless 0 3 1 3 1   Suicidal thoughts 2 3 0 1 0  PHQ-9 Score 21 25 13 22 9   Difficult doing work/chores Extremely dIfficult Extremely dIfficult Somewhat difficult Extremely dIfficult Not difficult at all       12/31/2022    2:15 PM 01/18/2022    3:25 PM 08/01/2021    9:36 AM 05/06/2020    1:26 PM  GAD 7 : Generalized Anxiety Score  Nervous, Anxious, on Edge 3 3 3 3   Control/stop worrying 3 2 3 3   Worry too much - different things 3 3 3 3   Trouble relaxing 3 2 3 2   Restless 3 3 3  0  Easily annoyed or irritable 3 3 1 2   Afraid - awful might  happen 3 3 1 3   Total GAD 7 Score 21 19 17 16   Anxiety Difficulty Extremely difficult Very difficult Somewhat difficult Not difficult at all   Relevant past medical, surgical, family and social history reviewed and updated as indicated. Interim medical history since our last visit reviewed. Allergies and medications reviewed and updated.  Review of Systems  Constitutional:  Positive for appetite change, chills and fatigue. Negative for activity change, diaphoresis, fever and unexpected weight change.  Respiratory:  Positive for shortness of breath (occasional at baseline). Negative for cough, chest tightness and wheezing.   Cardiovascular:  Negative for chest pain, palpitations and leg swelling.  Gastrointestinal:  Positive for nausea and vomiting. Negative for abdominal distention, abdominal pain, blood in stool, constipation, diarrhea and rectal pain.  Endocrine: Negative for cold intolerance, heat intolerance, polydipsia, polyphagia and polyuria.  Neurological:  Positive for dizziness and weakness. Negative for tremors, speech difficulty and headaches.  Psychiatric/Behavioral:  Positive for sleep disturbance. Negative for decreased concentration, self-injury and suicidal ideas. The patient is nervous/anxious.     Per HPI unless specifically indicated above     Objective:    BP 118/81 (BP Location: Left Arm, Patient Position: Sitting, Cuff Size: Normal)   Pulse 93   Temp 97.7 F (36.5 C) (Oral)   Ht 5' (1.524 m)   Wt 122 lb 6.4 oz (55.5 kg)   LMP  (LMP Unknown)   SpO2 97%   BMI 23.90 kg/m   Wt Readings from Last 3 Encounters:  06/18/23 122 lb 6.4 oz (55.5 kg)  06/07/23 119 lb 3.2 oz (54.1 kg)  06/06/23 120 lb (54.4 kg)    Physical Exam Vitals and nursing note reviewed.  Constitutional:      General: She is awake. She is not in acute distress.    Appearance: She is well-developed, well-groomed and underweight. She is not ill-appearing or toxic-appearing.  HENT:     Head:  Normocephalic.     Right Ear: Hearing and external ear normal.     Left Ear: Hearing and external ear normal.  Eyes:     General: Lids are normal.        Right eye: No discharge.        Left eye: No discharge.     Conjunctiva/sclera: Conjunctivae normal.     Pupils: Pupils are equal, round, and reactive to light.  Neck:     Thyroid: No thyromegaly.     Vascular: No carotid bruit.  Cardiovascular:     Rate and Rhythm: Regular rhythm. Tachycardia present.     Heart sounds: Normal heart sounds. No murmur heard.    No gallop.     Comments: Heart rate on auscultation 123  with moving from chair to exam table.  Regular. Pulmonary:     Effort: Pulmonary effort is normal. No accessory muscle usage or respiratory distress.     Breath sounds: Normal breath sounds. No decreased breath sounds, wheezing or rhonchi.  Abdominal:     General: Bowel sounds are normal. There is no distension.     Palpations: Abdomen is soft.     Tenderness: There is generalized abdominal tenderness. There is no right CVA tenderness or left CVA tenderness.     Comments: Overall tenderness on exam to all quadrants, worse to LUQ.  Musculoskeletal:     Cervical back: Normal range of motion and neck supple.     Right lower leg: No edema.     Left lower leg: No edema.  Lymphadenopathy:     Cervical: No cervical adenopathy.  Skin:    General: Skin is warm and dry.  Neurological:     Mental Status: She is alert and oriented to person, place, and time.     Deep Tendon Reflexes: Reflexes are normal and symmetric.     Reflex Scores:      Brachioradialis reflexes are 2+ on the right side and 2+ on the left side.      Patellar reflexes are 2+ on the right side and 2+ on the left side. Psychiatric:        Attention and Perception: Attention normal.        Mood and Affect: Mood normal.        Speech: Speech normal.        Behavior: Behavior normal. Behavior is cooperative.        Thought Content: Thought content normal.      Results for orders placed or performed in visit on 06/06/23  Mitochondrial antibodies  Result Value Ref Range   Mitochondrial M2 Ab, IgG 28.9 (H) <=20.0 U  Alpha-1-antitrypsin  Result Value Ref Range   A-1 Antitrypsin, Ser 154 83 - 199 mg/dL  Hepatic function panel  Result Value Ref Range   Total Bilirubin 0.3 0.2 - 1.2 mg/dL   Bilirubin, Direct 0.1 0.0 - 0.3 mg/dL   Alkaline Phosphatase 199 (H) 39 - 117 U/L   AST 92 (H) 0 - 37 U/L   ALT 136 (H) 0 - 35 U/L   Total Protein 5.9 (L) 6.0 - 8.3 g/dL   Albumin 3.1 (L) 3.5 - 5.2 g/dL      Assessment & Plan:   Problem List Items Addressed This Visit       Cardiovascular and Mediastinum   Hypertension associated with type 2 diabetes mellitus (HCC)    Chronic with levels on lower side recently and some orthostatic changes, suspected related to dehydration due to current N&V.  Recommend to hold Lasix.  Check BP 2 times daily a home and document for visits.  DASH diet at home.  LABS: CBC and CMP.  Blood pressure is controlled.  Recommend increasing water intake and moving with caution when changing positions.      Chronic heart failure with preserved ejection fraction (HCC) - Primary    Chronic, historic condition She has follow up with Cardiology tomorrow for this  Recommend wearing compression stockings and continuing with Lasix as directed to manage until she can be seen by Cariology for follow up  - Reminded to call for an overnight weight gain of >2 pounds or a weekly weight gain of >5 pounds - not adding salt to food and read food labels. Reviewed the importance  of keeping daily sodium intake to 2000mg  daily. - Avoid Ibuprofen products.       Coronary artery disease involving native coronary artery of native heart with unstable angina pectoris (HCC)    Chronic. Continue with Plavix and rosuvastatin.  Would benefit from increased dose of Rosuvastatin.  Will discuss at next visit.  Labs ordered today.          Endocrine    Diabetes mellitus type 2 in nonobese (HCC) (Chronic)    Chronic, Stable. She is checking glucose levels at home irregularly. She has been taking Januvia 100 mg PO every day and appears to be tolerating well- continue current regimen Will recheck A1c in about 2-3 months for monitoring Follow up in 3 months or sooner if concerns arise.       Relevant Orders   Comp Met (CMET)     Other   Vitamin D deficiency    Labs ordered at visit today.  Will make recommendations based on lab results.        Relevant Orders   Vitamin D (25 hydroxy)   Iron deficiency anemia    Labs ordered at visit today.  Will make recommendations based on lab results.        Relevant Orders   CBC w/Diff   Hypertriglyceridemia    Labs ordered at visit today.  Will make recommendations based on lab results.        Relevant Orders   Lipid Profile     Follow up plan: Return in about 3 months (around 09/18/2023) for HTN, HLD, DM2 FU, MWV.

## 2023-06-19 ENCOUNTER — Ambulatory Visit: Payer: BC Managed Care – PPO | Attending: Internal Medicine | Admitting: Internal Medicine

## 2023-06-19 ENCOUNTER — Telehealth: Payer: Self-pay

## 2023-06-19 ENCOUNTER — Encounter: Payer: Self-pay | Admitting: Internal Medicine

## 2023-06-19 VITALS — BP 137/84 | HR 72 | Ht 60.0 in | Wt 120.6 lb

## 2023-06-19 DIAGNOSIS — I25708 Atherosclerosis of coronary artery bypass graft(s), unspecified, with other forms of angina pectoris: Secondary | ICD-10-CM

## 2023-06-19 DIAGNOSIS — R7989 Other specified abnormal findings of blood chemistry: Secondary | ICD-10-CM | POA: Diagnosis not present

## 2023-06-19 DIAGNOSIS — I5033 Acute on chronic diastolic (congestive) heart failure: Secondary | ICD-10-CM | POA: Diagnosis not present

## 2023-06-19 DIAGNOSIS — I471 Supraventricular tachycardia, unspecified: Secondary | ICD-10-CM

## 2023-06-19 DIAGNOSIS — E785 Hyperlipidemia, unspecified: Secondary | ICD-10-CM | POA: Diagnosis not present

## 2023-06-19 DIAGNOSIS — Z79899 Other long term (current) drug therapy: Secondary | ICD-10-CM

## 2023-06-19 LAB — CBC WITH DIFFERENTIAL/PLATELET
Basophils Absolute: 0 10*3/uL (ref 0.0–0.2)
Basos: 1 %
EOS (ABSOLUTE): 0.1 10*3/uL (ref 0.0–0.4)
Eos: 1 %
Hematocrit: 36.5 % (ref 34.0–46.6)
Hemoglobin: 11 g/dL — ABNORMAL LOW (ref 11.1–15.9)
Immature Grans (Abs): 0 10*3/uL (ref 0.0–0.1)
Immature Granulocytes: 1 %
Lymphocytes Absolute: 1.3 10*3/uL (ref 0.7–3.1)
Lymphs: 32 %
MCH: 28.7 pg (ref 26.6–33.0)
MCHC: 30.1 g/dL — ABNORMAL LOW (ref 31.5–35.7)
MCV: 95 fL (ref 79–97)
Monocytes Absolute: 0.3 10*3/uL (ref 0.1–0.9)
Monocytes: 7 %
Neutrophils Absolute: 2.3 10*3/uL (ref 1.4–7.0)
Neutrophils: 58 %
Platelets: 284 10*3/uL (ref 150–450)
RBC: 3.83 x10E6/uL (ref 3.77–5.28)
RDW: 12.6 % (ref 11.7–15.4)
WBC: 3.9 10*3/uL (ref 3.4–10.8)

## 2023-06-19 LAB — COMPREHENSIVE METABOLIC PANEL
ALT: 95 [IU]/L — ABNORMAL HIGH (ref 0–32)
AST: 61 [IU]/L — ABNORMAL HIGH (ref 0–40)
Albumin: 3.1 g/dL — ABNORMAL LOW (ref 3.9–4.9)
Alkaline Phosphatase: 227 [IU]/L — ABNORMAL HIGH (ref 44–121)
BUN/Creatinine Ratio: 11 — ABNORMAL LOW (ref 12–28)
BUN: 8 mg/dL (ref 8–27)
Bilirubin Total: 0.2 mg/dL (ref 0.0–1.2)
CO2: 23 mmol/L (ref 20–29)
Calcium: 8.1 mg/dL — ABNORMAL LOW (ref 8.7–10.3)
Chloride: 100 mmol/L (ref 96–106)
Creatinine, Ser: 0.7 mg/dL (ref 0.57–1.00)
Globulin, Total: 2.4 g/dL (ref 1.5–4.5)
Glucose: 372 mg/dL — ABNORMAL HIGH (ref 70–99)
Potassium: 3.3 mmol/L — ABNORMAL LOW (ref 3.5–5.2)
Sodium: 137 mmol/L (ref 134–144)
Total Protein: 5.5 g/dL — ABNORMAL LOW (ref 6.0–8.5)
eGFR: 95 mL/min/{1.73_m2} (ref >59)

## 2023-06-19 LAB — LIPID PANEL
Chol/HDL Ratio: 2 ratio (ref 0.0–4.4)
Cholesterol, Total: 92 mg/dL — ABNORMAL LOW (ref 100–199)
HDL: 47 mg/dL (ref >39)
LDL Chol Calc (NIH): 27 mg/dL (ref 0–99)
Triglycerides: 96 mg/dL (ref 0–149)
VLDL Cholesterol Cal: 18 mg/dL (ref 5–40)

## 2023-06-19 LAB — VITAMIN D 25 HYDROXY (VIT D DEFICIENCY, FRACTURES): Vit D, 25-Hydroxy: 9.2 ng/mL — ABNORMAL LOW (ref 30.0–100.0)

## 2023-06-19 MED ORDER — FUROSEMIDE 40 MG PO TABS: 40.0000 mg | ORAL_TABLET | Freq: Two times a day (BID) | ORAL | 3 refills | Status: DC

## 2023-06-19 MED ORDER — POTASSIUM CHLORIDE CRYS ER 20 MEQ PO TBCR: 20.0000 meq | EXTENDED_RELEASE_TABLET | Freq: Two times a day (BID) | ORAL | 3 refills | Status: DC

## 2023-06-19 NOTE — Telephone Encounter (Signed)
Patient has an appointment with [facility] on 10-10-23 at 1:30pm.

## 2023-06-19 NOTE — Addendum Note (Signed)
Addended by: [name] on: 06/19/2023 01:41 PM   Modules accepted: Orders

## 2023-06-19 NOTE — Telephone Encounter (Signed)
-----   Message from [name] sent at 06/19/2023  1:41 PM EST ----- HI Ms. Amy Hansen.  Your lab work shows that your liver enzymes are abnormal.  But you are following up with the liver specialist for that.  Your potassium was a little low.  I would like you to come back next week and have that rechecked just as a lab visit.   Your vitamin D is also very low.  I have sent in a prescription for vitamin D 50,000 international units  twice weekly to help with this.  You will take this for 12 weeks.

## 2023-06-19 NOTE — Patient Instructions (Addendum)
Medication Instructions:  Your physician recommends the following medication changes.  START TAKING: Potassium 20 meq by mouth twice a day  INCREASE: Lasix to 40 mg by mouth twice a day   *If you need a refill on your cardiac medications before your next appointment, please call your pharmacy*   Lab Work: Your provider would like for you to return in 1-2 weeks to have the following labs drawn: (BMP).   Please go to [facility] [address] [address], [place] [address] You do not need an appointment.  They are open from 7:30 am-4 pm.  Lunch from 1:00 pm- 2:00 pm You will not need to be fasting.   Testing/Procedures: No test ordered today    Follow-Up: At [facility], you and your health needs are our priority.  As part of our continuing mission to provide you with exceptional heart care, we have created designated Provider Care Teams.  These Care Teams include your primary Cardiologist (physician) and Advanced Practice Providers (APPs -  Physician Assistants and Nurse Practitioners) who all work together to provide you with the care you need, when you need it.  We recommend signing up for the patient portal called "MyChart".  Sign up information is provided on this After Visit Summary.  MyChart is used to connect with patients for Virtual Visits (Telemedicine).  Patients are able to view lab/test results, encounter notes, upcoming appointments, etc.  Non-urgent messages can be sent to your provider as well.   To learn more about what you can do with MyChart, go to [email or web address].    Your next appointment:   1 -2 week(s)  Provider:   You may see Yvonne Kendall, MD or one of the following Advanced Practice Providers on your designated Care Team:   Nicolasa Ducking, NP Eula Listen, PA-C Cadence Fransico Michael, PA-C Charlsie Quest, NP Carlos Levering, NP    Dr. Okey Dupre recommends limiting your fluid intake to 2 L a day

## 2023-06-19 NOTE — Progress Notes (Unsigned)
Cardiology Office Note:  .   Date:  06/20/2023  ID:  [name], DOB 04-01-1956, MRN [id number] PCP: [name], NP  [facility] Providers Cardiologist:  Yvonne Kendall, MD     History of Present Illness: .   Amy Hansen is a 67 y.o. female with history of HFpEF, coronary artery disease s/p PCI to OM1 in 04/2023, PSVT, stroke, type 2 diabetes mellitus, anemia, indeterminate colitis, abnormal LFTs, GERD, hiatal hernia, restless leg syndrome, anxiety, and depression, who presents for urgent evaluation of leg edema accompanied by weight gain, shortness of breath, and chest pain.  She was seen in our office 2 weeks ago by Eula Listen, PA, for follow-up of her CAD/PCI last month.  At that time, she was doing well but noted 2 episodes of chest pain at rest since her PCI that prompted her to take sublingual nitroglycerin.  She noted bilateral ankle swelling that had improved from prior visits.  She was placed on isosorbide mononitrate 15 mg daily.  She reached out to our office at the [facility] of last week complaining of worsening leg edema, weight gain, and shortness of breath, as well as "on and off" chest pain.  Today, his Volkert reports that her swelling never really got any better since her last visit with [name].  Anything, her weight has gone up over the last month.  Her legs now feel tight and painful.  She continues to use furosemide 20 mg twice daily.  She notes occasional exertional dyspnea when doing housework.  She also has continued sporadic chest pain that typically only lasts a second or 2 and comes on randomly.  Overall, she is feeling better since her PCI to OM1 last month.  She seems to be tolerating isosorbide mononitrate well without side effects.  She has experienced more tingling in her arms and the legs, which prompted her neurologist to add pregabalin earlier this month.  In hindsight, it seems like her leg swelling worsened when she began taking this.  ROS: See  HPI  Studies Reviewed: [name]   EKG Interpretation Date/Time:  Wednesday June 19 2023 15:40:09 EST Ventricular Rate:  72 PR Interval:  158 QRS Duration:  78 QT Interval:  386 QTC Calculation: 422 R Axis:   -13  Text Interpretation: Normal sinus rhythm Normal ECG When compared with ECG of 16-May-2023 05:38, No significant change was found Confirmed by Nao Linz [phone]) on 06/20/2023 7:35:29 AM    LHC/PCI (05/15/2023): Severe single-vessel coronary artery disease with 90% stenosis of proximal OM1.  There is mild-moderate disease involving the LAD and RCA.  Sluggish flow in the distal LAD on initial images resolved with intracoronary nitroglycerin suggesting some element of coronary vasospasm. Normal left ventricular systolic function (LVEF 55-65%) with upper normal filling pressure (LVEDP 15 mmHg). Successful PCI to OM1 using Onyx Frontier 2.25 x 18 mm drug-eluting stent (postdilated to 2.6 mm) with 0% residual stenosis and TIMI-3 flow. Small right radial artery necessitating use of 73F sheath/catheters.  Consider alternative access for future catheterizations, particularly if larger catheters are necessary.  Risk Assessment/Calculations:             Physical Exam:   VS:  BP 137/84 (BP Location: Left Arm, Patient Position: Sitting, Cuff Size: Normal)   Pulse 72   Ht 5' (1.524 m)   Wt 120 lb 9.6 oz (54.7 kg)   LMP  (LMP Unknown)   SpO2 97%   BMI 23.55 kg/m    Wt Readings from Last  3 Encounters:  06/19/23 120 lb 9.6 oz (54.7 kg)  06/18/23 122 lb 6.4 oz (55.5 kg)  06/07/23 119 lb 3.2 oz (54.1 kg)    General:  NAD. Neck: JVP ~6 cm with positive HJR. Lungs: Clear to auscultation bilaterally without wheezes or crackles. Heart: Regular rate and rhythm without murmurs, rubs, or gallops. Abdomen: Soft, nontender, nondistended. Extremities: 1-2+ pretibial edema bilaterally.  ASSESSMENT AND PLAN: .    Coronary artery disease and atypical chest pain: Ms. Wattley overall feels  better following her PCI to OM1 last month.  She still has sporadic chest pain that only lasts a few seconds at a time and is unlikely to be related to coronary insufficiency.  We will plan to continue her low-dose isosorbide mononitrate as she is tolerating this well.  Defer initiation of cardiac rehab pending improvement of her acute on chronic HFpEF as detailed.  Acute on chronic HFpEF: Ms. Marble appears volume overloaded on examination today and has put on 5 pounds since her last visit 2 weeks ago and 8 pounds since her PCI in October.  We have agreed to increase furosemide to 40 mg twice daily.  Labs checked 2 days ago were notable for a creatinine of 0.7 and potassium of 3.3.  We will have Ms. Klug begin potassium chloride 20 mEq twice daily with escalation of furosemide.  BMP should be rechecked at her follow-up visit in 1-2 weeks.  If swelling does not improve, Ms. Gundy will need to speak with her neurologist about changing/stopping pregabalin, as this can also cause peripheral edema.  Importance of fluid restriction reinforced.  Abnormal liver function tests: Repeat labs show persistently elevated AST, ALT, and alkaline phosphatase.  Ms. Zufall has been referred to hepatology for further evaluation.  Hyperlipidemia: We will plan to continue rosuvastatin 5 mg daily for now and defer medication changes pending hepatology evaluation in the setting of persistently abnormal LFTs.    Cardiac Rehabilitation Eligibility Assessment  The patient is NOT ready to start cardiac rehabilitation due to: Other    Dispo: Return to clinic in 1-2 weeks with BMP at that time.  Signed, Yvonne Kendall, MD

## 2023-06-20 ENCOUNTER — Encounter: Payer: Self-pay | Admitting: Internal Medicine

## 2023-06-20 DIAGNOSIS — R7989 Other specified abnormal findings of blood chemistry: Secondary | ICD-10-CM | POA: Insufficient documentation

## 2023-06-24 ENCOUNTER — Other Ambulatory Visit: Payer: Self-pay | Admitting: Physician Assistant

## 2023-06-25 LAB — ANTI-NUCLEAR AB-TITER (ANA TITER): ANA Titer 1: 1:40 {titer} — ABNORMAL HIGH

## 2023-06-25 LAB — ANTI-MICROSOMAL ANTIBODY LIVER / KIDNEY: LKM1 Ab: <=20 U (ref <=20.0)

## 2023-06-25 LAB — ANA: Anti Nuclear Antibody (ANA): POSITIVE — AB

## 2023-06-25 LAB — IGG: IgG (Immunoglobin G), Serum: 1225 mg/dL (ref 600–1540)

## 2023-06-25 LAB — ANTI-SMOOTH MUSCLE ANTIBODY, IGG: Actin (Smooth Muscle) Antibody (IGG): <20 U (ref <20)

## 2023-06-26 ENCOUNTER — Ambulatory Visit: Payer: Self-pay

## 2023-06-26 ENCOUNTER — Telehealth: Payer: Self-pay | Admitting: Nurse Practitioner

## 2023-06-26 MED ORDER — TIRZEPATIDE 2.5 MG/0.5ML ~~LOC~~ SOAJ: 2.5000 mg | SUBCUTANEOUS | 0 refills | Status: DC

## 2023-06-26 MED ORDER — TIRZEPATIDE 5 MG/0.5ML ~~LOC~~ SOAJ: 5.0000 mg | SUBCUTANEOUS | 0 refills | Status: DC

## 2023-06-26 NOTE — Telephone Encounter (Signed)
Patient called, left VM to return the call to the office to speak to the NT.    Summary: Blood sugar high   Pt called in says Blood sugar is high, says Blood sugar is 394, and A1c is at 8. She is asking for Dr Caren Griffins to put her on the [facility]

## 2023-06-26 NOTE — Telephone Encounter (Signed)
Pt is calling in because she went to the pharmacy to pick up [facility]) and was told that the medication needs a Prior Authorization before it can be filled. Pt is requesting that process be started so she can get the medication as soon as possible.

## 2023-06-26 NOTE — Telephone Encounter (Signed)
PA for [facility] initiated and submitted via Cover My Meds. Key: [id number]  Called and notified patient that prior Berkley Harvey has been submitted and to check with her pharmacy on Friday or Saturday to see if medication was approved since the office will be closed until Monday. Patient verbalized understanding.

## 2023-06-26 NOTE — Telephone Encounter (Signed)
Mounjaro sent to the pharmacy.  Patient needs to start back at the 2.5mg  dose then increase to 5mg  after 1 month.

## 2023-06-26 NOTE — Telephone Encounter (Signed)
Reason for Disposition  [1] Blood glucose > 300 mg/dL (21.3 mmol/L) AND [0] two or more times in a row  Answer Assessment - Initial Assessment Questions 1. BLOOD GLUCOSE: "What is your blood glucose level?"      I was on Mounjaro before.   I've been sick for a while but I'm cleared up now.       I'm on the pill.   I thought the Gregg[facility] Keen was making me sick but it wasn't it.   I'm wondering if she would put me back on the [facility].   I saw a MyChart message that I'm to have blood work done after Thanksgiving.   I have an appt with my heart doctor.   Maybe she could let my heart doctor know what she needs drawn.   That appt is next Wed or Thur. Next week.    I've been off of the Mounjaro 3 months or longer.    My glucose has been elevated.  394 and 392 when checked so I could have an injection in my shoulder.   But because it was so high they would not give me the injections either time.      I'm not checking my glucose at home.   The 2 readings were from procedures and that would not do it because of my glucose.  I'm having bad swelling in my feet and ankles.   I'm am thirsty a lot.   I have to limit my fluids due to my heart.   I'm taking Lasix 40 mg twice a day.     I'm urinating more frequently than my usual due to the swelling and Lasix.  There is 4 lbs or 6 lb difference in the mornings for the past month or longer with my wt. Being up.    I'll wait and see what [name] advises before making an appt.    2. ONSET: "When did you check the blood glucose?"     It was done before 2 procedures I was to have done but they didn't do it because my glucose was over 250.   It's an injection I get in my shoulder.   They won't do it if the glucose is over 250.    3. USUAL RANGE: "What is your glucose level usually?" (e.g., usual fasting morning value, usual evening value)     When on the [facility] I didn't have problems with my glucose or A1C but now both are elevated.   That's why I would like to go  back on it.   4. KETONES: "Do you check for ketones (urine or blood test strips)?" If Yes, ask: "What does the test show now?"      Not asked 5. TYPE 1 or 2:  "Do you know what type of diabetes you have?"  (e.g., Type 1, Type 2, Gestational; doesn't know)      Not asked 6. INSULIN: "Do you take insulin?" "What type of insulin(s) do you use? What is the mode of delivery? (syringe, pen; injection or pump)?"      Not asked 7. DIABETES PILLS: "Do you take any pills for your diabetes?" If Yes, ask: "Have you missed taking any pills recently?"     Yes  8. OTHER SYMPTOMS: "Do you have any symptoms?" (e.g., fever, frequent urination, difficulty breathing, dizziness, weakness, vomiting)     Thirsty, urinary frequency but I have to limit my fluid intake to no more than 2 Liters total a day  due to my heart.   My feet and ankles are swollen. 9. PREGNANCY: "Is there any chance you are pregnant?" "When was your last menstrual period?"     N/A due to age  Protocols used: Diabetes - High Blood Sugar-A-AH  Chief Complaint  Wants to go back on Mounjaro.    She has blood work ordered by [name].   Her heart doctor wants her to have blood work too.  Can Clydie Braun let me heart doctor know what she needs so I can have the blood work for both doctors done at the same time?  Symptoms: Glucose is elevated  394 and 392   See notes Frequency: Before 2 procedures I needed to have done. Pertinent Negatives: Patient denies N/A Disposition: [] ED /[] Urgent Care (no appt availability in office) / [] Appointment(In office/virtual)/ []  [facility]/ [] Home Care/ [] Refused Recommended Disposition /[] [facility]/ [x]  Follow-up with PCP Additional Notes: Message sent to [name], NP

## 2023-06-26 NOTE — Telephone Encounter (Signed)
Spoke with patient and advised that provider sent in 2 strengths of Mounjaro and that she should start at the 2.5 mg dose for 28 days once weekly then increase to the 5 mg for the 2nd month. She is aware it has been sent to her preferred pharmacy.   No further questions or concerns.

## 2023-06-28 ENCOUNTER — Other Ambulatory Visit: Payer: Self-pay

## 2023-06-28 ENCOUNTER — Encounter: Payer: Self-pay | Admitting: Emergency Medicine

## 2023-06-28 ENCOUNTER — Encounter: Payer: Self-pay | Admitting: Nurse Practitioner

## 2023-06-28 ENCOUNTER — Observation Stay
Admission: EM | Admit: 2023-06-28 | Discharge: 2023-06-29 | Disposition: A | Discharge status: Home / Self Care | Payer: BC Managed Care – PPO | Attending: Internal Medicine | Admitting: Internal Medicine

## 2023-06-28 DIAGNOSIS — E559 Vitamin D deficiency, unspecified: Secondary | ICD-10-CM | POA: Diagnosis not present

## 2023-06-28 DIAGNOSIS — F419 Anxiety disorder, unspecified: Secondary | ICD-10-CM | POA: Diagnosis not present

## 2023-06-28 DIAGNOSIS — E1100 Type 2 diabetes mellitus with hyperosmolarity without nonketotic hyperglycemic-hyperosmolar coma (NKHHC): Secondary | ICD-10-CM | POA: Diagnosis not present

## 2023-06-28 DIAGNOSIS — Z7982 Long term (current) use of aspirin: Secondary | ICD-10-CM | POA: Insufficient documentation

## 2023-06-28 DIAGNOSIS — E876 Hypokalemia: Secondary | ICD-10-CM | POA: Diagnosis not present

## 2023-06-28 DIAGNOSIS — I2510 Atherosclerotic heart disease of native coronary artery without angina pectoris: Secondary | ICD-10-CM | POA: Insufficient documentation

## 2023-06-28 DIAGNOSIS — N390 Urinary tract infection, site not specified: Secondary | ICD-10-CM | POA: Diagnosis not present

## 2023-06-28 DIAGNOSIS — E1165 Type 2 diabetes mellitus with hyperglycemia: Secondary | ICD-10-CM | POA: Diagnosis not present

## 2023-06-28 DIAGNOSIS — K219 Gastro-esophageal reflux disease without esophagitis: Secondary | ICD-10-CM | POA: Insufficient documentation

## 2023-06-28 DIAGNOSIS — Z794 Long term (current) use of insulin: Secondary | ICD-10-CM | POA: Diagnosis not present

## 2023-06-28 DIAGNOSIS — R739 Hyperglycemia, unspecified: Secondary | ICD-10-CM | POA: Diagnosis not present

## 2023-06-28 DIAGNOSIS — G2581 Restless legs syndrome: Secondary | ICD-10-CM | POA: Diagnosis not present

## 2023-06-28 DIAGNOSIS — G629 Polyneuropathy, unspecified: Secondary | ICD-10-CM

## 2023-06-28 DIAGNOSIS — E1142 Type 2 diabetes mellitus with diabetic polyneuropathy: Secondary | ICD-10-CM | POA: Insufficient documentation

## 2023-06-28 LAB — GLUCOSE, CAPILLARY
Glucose-Capillary: 329 mg/dL — ABNORMAL HIGH (ref 70–99)
Glucose-Capillary: 154 mg/dL — ABNORMAL HIGH (ref 70–99)
Glucose-Capillary: 133 mg/dL — ABNORMAL HIGH (ref 70–99)

## 2023-06-28 LAB — BASIC METABOLIC PANEL
Anion gap: 12 (ref 5–15)
Anion gap: 10 (ref 5–15)
BUN: 16 mg/dL (ref 8–23)
BUN: 15 mg/dL (ref 8–23)
CO2: 21 mmol/L — ABNORMAL LOW (ref 22–32)
CO2: 25 mmol/L (ref 22–32)
Calcium: 8.8 mg/dL — ABNORMAL LOW (ref 8.9–10.3)
Calcium: 8.3 mg/dL — ABNORMAL LOW (ref 8.9–10.3)
Chloride: 92 mmol/L — ABNORMAL LOW (ref 98–111)
Chloride: 96 mmol/L — ABNORMAL LOW (ref 98–111)
Creatinine, Ser: 0.93 mg/dL (ref 0.44–1.00)
Creatinine, Ser: 0.7 mg/dL (ref 0.44–1.00)
GFR, Estimated: >60 mL/min (ref >60)
GFR, Estimated: >60 mL/min (ref >60)
Glucose, Bld: 626 mg/dL (ref 70–99)
Glucose, Bld: 314 mg/dL — ABNORMAL HIGH (ref 70–99)
Potassium: 3.9 mmol/L (ref 3.5–5.1)
Potassium: 3.2 mmol/L — ABNORMAL LOW (ref 3.5–5.1)
Sodium: 125 mmol/L — ABNORMAL LOW (ref 135–145)
Sodium: 131 mmol/L — ABNORMAL LOW (ref 135–145)

## 2023-06-28 LAB — URINALYSIS, ROUTINE W REFLEX MICROSCOPIC
Bilirubin Urine: NEGATIVE
Glucose, UA: >=500 mg/dL — AB
Hgb urine dipstick: NEGATIVE
Ketones, ur: NEGATIVE mg/dL
Nitrite: NEGATIVE
Protein, ur: NEGATIVE mg/dL
Specific Gravity, Urine: 1.018 (ref 1.005–1.030)
pH: 6 (ref 5.0–8.0)

## 2023-06-28 LAB — CBC
HCT: 39.4 % (ref 36.0–46.0)
HCT: 33 % — ABNORMAL LOW (ref 36.0–46.0)
Hemoglobin: 12.6 g/dL (ref 12.0–15.0)
Hemoglobin: 11.1 g/dL — ABNORMAL LOW (ref 12.0–15.0)
MCH: 29.2 pg (ref 26.0–34.0)
MCH: 29.4 pg (ref 26.0–34.0)
MCHC: 32 g/dL (ref 30.0–36.0)
MCHC: 33.6 g/dL (ref 30.0–36.0)
MCV: 91.4 fL (ref 80.0–100.0)
MCV: 87.3 fL (ref 80.0–100.0)
Platelets: 383 10*3/uL (ref 150–400)
Platelets: 324 10*3/uL (ref 150–400)
RBC: 4.31 MIL/uL (ref 3.87–5.11)
RBC: 3.78 MIL/uL — ABNORMAL LOW (ref 3.87–5.11)
RDW: 13.3 % (ref 11.5–15.5)
RDW: 13 % (ref 11.5–15.5)
WBC: 5 10*3/uL (ref 4.0–10.5)
WBC: 5 10*3/uL (ref 4.0–10.5)
nRBC: 0 % (ref 0.0–0.2)
nRBC: 0 % (ref 0.0–0.2)

## 2023-06-28 LAB — MRSA NEXT GEN BY PCR, NASAL: MRSA by PCR Next Gen: NOT DETECTED

## 2023-06-28 LAB — CBG MONITORING, ED
Glucose-Capillary: 578 mg/dL (ref 70–99)
Glucose-Capillary: 409 mg/dL — ABNORMAL HIGH (ref 70–99)

## 2023-06-28 LAB — OSMOLALITY: Osmolality: 295 mosm/kg (ref 275–295)

## 2023-06-28 MED ORDER — POTASSIUM CHLORIDE 10 MEQ/100ML IV SOLN: 10.0000 meq | INTRAVENOUS | Status: AC

## 2023-06-28 MED ORDER — ROSUVASTATIN CALCIUM 10 MG PO TABS
5.0000 mg | ORAL_TABLET | Freq: Every day | ORAL | Status: DC
  Administered 2023-06-29: 5 mg via ORAL
  Filled 2023-06-28: qty 1

## 2023-06-28 MED ORDER — ONDANSETRON HCL 4 MG/2ML IJ SOLN: 4.0000 mg | INTRAMUSCULAR | Status: DC | PRN

## 2023-06-28 MED ORDER — DEXTROSE 50 % IV SOLN: 0.0000 mL | INTRAVENOUS | Status: DC | PRN

## 2023-06-28 MED ORDER — TRAZODONE HCL 50 MG PO TABS: 25.0000 mg | ORAL_TABLET | Freq: Every evening | ORAL | Status: DC | PRN

## 2023-06-28 MED ORDER — SERTRALINE HCL 50 MG PO TABS
50.0000 mg | ORAL_TABLET | Freq: Every day | ORAL | Status: DC
  Administered 2023-06-29: 50 mg via ORAL
  Filled 2023-06-28: qty 1

## 2023-06-28 MED ORDER — LACTATED RINGERS IV BOLUS
20.0000 mL/kg | Freq: Once | INTRAVENOUS | Status: AC
  Administered 2023-06-28: 1094 mL via INTRAVENOUS

## 2023-06-28 MED ORDER — CLOPIDOGREL BISULFATE 75 MG PO TABS
75.0000 mg | ORAL_TABLET | Freq: Every day | ORAL | Status: DC
  Administered 2023-06-29: 75 mg via ORAL
  Filled 2023-06-28: qty 1

## 2023-06-28 MED ORDER — INSULIN REGULAR(HUMAN) IN NACL 100-0.9 UT/100ML-% IV SOLN: INTRAVENOUS | Status: DC

## 2023-06-28 MED ORDER — FUROSEMIDE 20 MG PO TABS: 40.0000 mg | ORAL_TABLET | Freq: Two times a day (BID) | ORAL | Status: DC

## 2023-06-28 MED ORDER — SODIUM CHLORIDE 0.9 % IV SOLN
1.0000 g | INTRAVENOUS | Status: DC
  Filled 2023-06-28: qty 10

## 2023-06-28 MED ORDER — DEXTROSE IN LACTATED RINGERS 5 % IV SOLN: INTRAVENOUS | Status: DC

## 2023-06-28 MED ORDER — MAGNESIUM HYDROXIDE 400 MG/5ML PO SUSP: 30.0000 mL | Freq: Every day | ORAL | Status: DC | PRN

## 2023-06-28 MED ORDER — NITROGLYCERIN 0.4 MG SL SUBL: 0.4000 mg | SUBLINGUAL_TABLET | SUBLINGUAL | Status: DC | PRN

## 2023-06-28 MED ORDER — PRAMIPEXOLE DIHYDROCHLORIDE 1 MG PO TABS
1.0000 mg | ORAL_TABLET | Freq: Every day | ORAL | Status: DC
  Administered 2023-06-28: 1 mg via ORAL
  Filled 2023-06-28: qty 1

## 2023-06-28 MED ORDER — QUETIAPINE FUMARATE 100 MG PO TABS
300.0000 mg | ORAL_TABLET | Freq: Every day | ORAL | Status: DC
  Administered 2023-06-28: 300 mg via ORAL
  Filled 2023-06-28: qty 3

## 2023-06-28 MED ORDER — ASPIRIN 81 MG PO TBEC
81.0000 mg | DELAYED_RELEASE_TABLET | Freq: Every day | ORAL | Status: DC
  Administered 2023-06-29: 81 mg via ORAL
  Filled 2023-06-28: qty 1

## 2023-06-28 MED ORDER — ROPINIROLE HCL 1 MG PO TABS
1.0000 mg | ORAL_TABLET | Freq: Two times a day (BID) | ORAL | Status: DC
  Administered 2023-06-28 – 2023-06-29 (×2): 1 mg via ORAL
  Filled 2023-06-28 (×2): qty 1

## 2023-06-28 MED ORDER — LACTATED RINGERS IV SOLN: INTRAVENOUS | Status: DC

## 2023-06-28 MED ORDER — ENOXAPARIN SODIUM 40 MG/0.4ML IJ SOSY
40.0000 mg | PREFILLED_SYRINGE | INTRAMUSCULAR | Status: DC
  Administered 2023-06-28: 40 mg via SUBCUTANEOUS
  Filled 2023-06-28: qty 0.4

## 2023-06-28 MED ORDER — SODIUM CHLORIDE 0.9 % IV SOLN
2.0000 g | Freq: Once | INTRAVENOUS | Status: AC
  Administered 2023-06-28: 2 g via INTRAVENOUS
  Filled 2023-06-28: qty 20

## 2023-06-28 MED ORDER — ACETAMINOPHEN 325 MG PO TABS
650.0000 mg | ORAL_TABLET | ORAL | Status: DC | PRN
  Administered 2023-06-28: 650 mg via ORAL
  Filled 2023-06-28: qty 2

## 2023-06-28 MED ORDER — PREGABALIN 25 MG PO CAPS
25.0000 mg | ORAL_CAPSULE | Freq: Two times a day (BID) | ORAL | Status: DC
  Filled 2023-06-28 (×2): qty 1

## 2023-06-28 MED ORDER — ALPRAZOLAM 0.5 MG PO TABS
0.5000 mg | ORAL_TABLET | Freq: Two times a day (BID) | ORAL | Status: DC | PRN
  Administered 2023-06-28 – 2023-06-29 (×2): 0.5 mg via ORAL
  Filled 2023-06-28 (×2): qty 1

## 2023-06-28 MED ORDER — INSULIN REGULAR(HUMAN) IN NACL 100-0.9 UT/100ML-% IV SOLN
INTRAVENOUS | Status: DC
  Administered 2023-06-28: 8.5 [IU]/h via INTRAVENOUS
  Filled 2023-06-28: qty 100

## 2023-06-28 MED ORDER — METOPROLOL TARTRATE 25 MG PO TABS
25.0000 mg | ORAL_TABLET | Freq: Two times a day (BID) | ORAL | Status: DC
  Administered 2023-06-28 – 2023-06-29 (×2): 25 mg via ORAL
  Filled 2023-06-28 (×2): qty 1

## 2023-06-28 MED ORDER — POTASSIUM CHLORIDE CRYS ER 20 MEQ PO TBCR
20.0000 meq | EXTENDED_RELEASE_TABLET | Freq: Two times a day (BID) | ORAL | Status: DC
  Administered 2023-06-28: 20 meq via ORAL
  Filled 2023-06-28: qty 1

## 2023-06-28 MED ORDER — ISOSORBIDE MONONITRATE ER 30 MG PO TB24
15.0000 mg | ORAL_TABLET | Freq: Every day | ORAL | Status: DC
  Administered 2023-06-29: 15 mg via ORAL
  Filled 2023-06-28: qty 1

## 2023-06-28 MED ORDER — PANTOPRAZOLE SODIUM 40 MG PO TBEC
40.0000 mg | DELAYED_RELEASE_TABLET | Freq: Every day | ORAL | Status: DC
  Administered 2023-06-29: 40 mg via ORAL
  Filled 2023-06-28: qty 1

## 2023-06-28 NOTE — Assessment & Plan Note (Addendum)
-   We will continue Mirapex and Requip.

## 2023-06-28 NOTE — ED Provider Notes (Signed)
[facility] Provider Note    Event Date/Time   First MD Initiated Contact with Patient 06/28/23 1856     (approximate)   History   Hyperglycemia   HPI  Amy Hansen is a 67 y.o. female with diabetes who comes in with concern for elevated sugar.  Patient reports that she is currently only on Januvia.  She states that previously she was on [facility] but had stopped taking it a few months ago and she has been trying to get back on it but is having difficulties with insurance.  She states previously being on insulins but being taken off of it.  She states that her sugars have been running high and recently they have been even higher in the 600s.  She denies any other severe symptoms.  She does report that she has not been on any steroids in the last 3 weeks.  She does have a history of CHF.  Physical Exam   Triage Vital Signs: ED Triage Vitals [06/28/23 1732]  Encounter Vitals Group     BP 123/88     Systolic BP Percentile      Diastolic BP Percentile      Pulse Rate (!) 107     Resp 16     Temp 98.2 F (36.8 C)     Temp Source Oral     SpO2 96 %     Weight      Height      Head Circumference      Peak Flow      Pain Score 4     Pain Loc      Pain Education      Exclude from Growth Chart     Most recent vital signs: Vitals:   06/28/23 1732  BP: 123/88  Pulse: (!) 107  Resp: 16  Temp: 98.2 F (36.8 C)  SpO2: 96%     General: Awake, no distress.  CV:  Good peripheral perfusion.  Resp:  Normal effort.  Abd:  No distention.  Other:  Swelling noted in the lower EXTR worse on the right   ED Results / Procedures / Treatments   Labs (all labs ordered are listed, but only abnormal results are displayed) Labs Reviewed  BASIC METABOLIC PANEL - Abnormal; Notable for the following components:      Result Value   Sodium 125 (*)    Chloride 92 (*)    CO2 21 (*)    Glucose, Bld 626 (*)    Calcium 8.8 (*)    All other components within  normal limits  CBG MONITORING, ED - Abnormal; Notable for the following components:   Glucose-Capillary 578 (*)    All other components within normal limits  CBC  URINALYSIS, ROUTINE W REFLEX MICROSCOPIC  CBG MONITORING, ED  CBG MONITORING, ED     PROCEDURES:  Critical Care performed: Yes, see critical care procedure note(s)  .Critical Care  Performed by: Concha Se, MD Authorized by: Concha Se, MD   Critical care provider statement:    Critical care time (minutes):  30   Critical care was necessary to treat or prevent imminent or life-threatening deterioration of the following conditions:  Endocrine crisis   Critical care was time spent personally by me on the following activities:  Development of treatment plan with patient or surrogate, discussions with consultants, evaluation of patient's response to treatment, examination of patient, ordering and review of laboratory studies, ordering and review of radiographic  studies, ordering and performing treatments and interventions, pulse oximetry, re-evaluation of patient's condition and review of old charts    MEDICATIONS ORDERED IN ED: Medications  insulin regular, human (MYXREDLIN) 100 units/ 100 mL infusion (has no administration in time range)  dextrose 50 % solution 0-50 mL (has no administration in time range)  lactated ringers infusion (has no administration in time range)  dextrose 5 % in lactated ringers infusion (has no administration in time range)  cefTRIAXone (ROCEPHIN) 2 g in sodium chloride 0.9 % 100 mL IVPB (has no administration in time range)  enoxaparin (LOVENOX) injection 40 mg (has no administration in time range)  insulin regular, human (MYXREDLIN) 100 units/ 100 mL infusion (has no administration in time range)  lactated ringers infusion (has no administration in time range)  potassium chloride 10 mEq in 100 mL IVPB (has no administration in time range)  acetaminophen (TYLENOL) tablet 650 mg (has no  administration in time range)  traZODone (DESYREL) tablet 25 mg (has no administration in time range)  ondansetron (ZOFRAN) injection 4 mg (has no administration in time range)  magnesium hydroxide (MILK OF MAGNESIA) suspension 30 mL (has no administration in time range)  lactated ringers bolus 1,094 mL (1,094 mLs Intravenous New Bag/Given 06/28/23 1948)     IMPRESSION / MDM / ASSESSMENT AND PLAN / ED COURSE  I reviewed the triage vital signs and the nursing notes.   Patient's presentation is most consistent with acute presentation with potential threat to life or bodily function.   Patient comes in with hyperglycemia I suspect related to inadequate medications.  I suspect patient will need to be restarted on insulin.  Given his the weekend we discussed given how high her sugar is that if I corrected that today we will just go back up by tomorrow.  She does not have any obvious signs of DKA.  Her urine looks concerning for possibly a UTI.  She has significant CHF and has some swelling noted in her legs.  She declines DVT ultrasound states that this is normal for her from her CHF.  This will limit how much fluid we can give her.  Will start her on a insulin drip given how high her sugars are and give her some some potassium  Patient comes in with elevated sugar.  Her bicarb is slightly low but her anion gap is normal.  I did discuss the hospital team for admission.  The patient is on the cardiac monitor to evaluate for evidence of arrhythmia and/or significant heart rate changes.      FINAL CLINICAL IMPRESSION(S) / ED DIAGNOSES   Final diagnoses:  Hyperglycemia     Rx / DC Orders   ED Discharge Orders     None        Note:  This document was prepared using Dragon voice recognition software and may include unintentional dictation errors.   Concha Se, MD 06/28/23 Rosamaria Lints

## 2023-06-28 NOTE — ED Triage Notes (Signed)
Pt with blood sugars about 1pm today at 582 and about 5 read "high"  Pt states she could not get approval for her mounjaro to pick it up today because insurance was closed.  Pt reports pain left arm that is from a fall and break in May.

## 2023-06-28 NOTE — Assessment & Plan Note (Signed)
-   This is associated with nonketotic hyperosmolar hyperglycemia. - The patient will be admitted to stepdown unit bed. - We will continue her on IV insulin drip. - Will continue aggressive hydration with IV lactated ringer. - Potassium replacement will be provided. - We will follow BMPs.

## 2023-06-28 NOTE — Assessment & Plan Note (Signed)
-   Will continue PPI therapy. 

## 2023-06-28 NOTE — Assessment & Plan Note (Deleted)
-   This is associated with nonketotic hyperosmolar hyperglycemia. - The patient will be admitted to stepdown unit bed. - We will continue her on IV insulin drip. - Will continue aggressive hydration with IV lactated ringer. - Potassium replacement will be provided. - We will follow BMPs.

## 2023-06-28 NOTE — Assessment & Plan Note (Signed)
-   The patient will be placed on IV Rocephin and will follow urine culture and sensitivity.

## 2023-06-28 NOTE — H&P (Addendum)
[place]   PATIENT NAME: Amy Hansen    MR#:  [id number]  DATE OF BIRTH:  Nov 13, 1955  DATE OF ADMISSION:  06/28/2023  PRIMARY CARE PHYSICIAN: [name], NP   Patient is coming from: Home  REQUESTING/REFERRING PHYSICIAN: Artis Delay, MD  CHIEF COMPLAINT:   Chief Complaint  Patient presents with   Hyperglycemia    HISTORY OF PRESENT ILLNESS:  Amy Hansen is a 67 y.o. Caucasian female with medical history significant for type 2 diabetes mellitus, CHF, diverticulosis, RLS, anxiety and TIA, who presented to the emergency room with acute onset of glycemia with blood glucose reading more than 600.  She denied any nausea or vomiting or diarrhea.  She has been placed on p.o. Januvia which she has been taking regularly.  A while ago she has been on [facility] and it was stopped due to nausea and vomiting on 5 mg.  She was recently advised to restart it however due to the holiday and need for preauthorization it was not started yet.  She admits to polyuria and polydipsia.  She denied any fever or chills.  She has been having mild dry cough and lower extremity edema without orthopnea or paroxysmal nocturnal dyspnea.  She denied any chest pain or palpitations.  No dysuria or hematuria or flank pain.  ED Course: Upon presentation to the emergency room, vital signs were within normal.  Labs revealed hyponatremia 125 with a hypochloremia of 92 and CO2 21 with blood glucose of 626 and calcium of 8.8.  CBC was within normal.  UA was positive for UTI. EKG as reviewed by me : None Imaging: None  The patient was given 2 g of IV Rocephin and 2 L bolus of IV left finger.  She will be admitted to a stepdown unit bed for further evaluation and management. PAST MEDICAL HISTORY:   Past Medical History:  Diagnosis Date   Anemia    Anxiety    Arthritis    Blood transfusion without reported diagnosis    Cataract    CHF (congestive heart failure) (HCC)    Chronic kidney disease    UTI,  hematuria in urine   Colitis    Crohn's disease (HCC)    Depression    Diabetes (HCC)    Diverticulosis    Frequent headaches    Interstitial cystitis    Recurrent UTI    Restless leg syndrome    TIA (transient ischemic attack) 02/20/2021   Urinary frequency     PAST SURGICAL HISTORY:   Past Surgical History:  Procedure Laterality Date   bariatric bypass  2012   BIOPSY  05/03/2020   Procedure: BIOPSY;  Surgeon: Rachael Fee, MD;  Location: [facility] ENDOSCOPY;  Service: Endoscopy;;   CARPAL TUNNEL RELEASE Right 2003   CARPAL TUNNEL RELEASE Right    2008   CHOLECYSTECTOMY  1975   COLONOSCOPY     COLONOSCOPY WITH PROPOFOL N/A 05/03/2020   Procedure: COLONOSCOPY WITH PROPOFOL;  Surgeon: Rachael Fee, MD;  Location: [facility] ENDOSCOPY;  Service: Endoscopy;  Laterality: N/A;   CORONARY STENT INTERVENTION N/A 05/15/2023   Procedure: CORONARY STENT INTERVENTION;  Surgeon: Yvonne Kendall, MD;  Location: [facility] INVASIVE CV LAB;  Service: Cardiovascular;  Laterality: N/A;   CYSTOSCOPY W/ RETROGRADES Bilateral 06/06/2015   Procedure: CYSTOSCOPY WITH RETROGRADE PYELOGRAM;  Surgeon: Jerilee Field, MD;  Location: [facility] ORS;  Service: Urology;  Laterality: Bilateral;   EYE SURGERY Left 07/06/2022   FL INJ LEFT KNEE CT  ARTHROGRAM ([facility] HX) Left    1995   GASTRIC BYPASS  2010   HAND SURGERY Left 01/19/2021   Thumb   HEMORRHOID SURGERY  2013   KNEE ARTHROSCOPY Left 1996   LEFT HEART CATH AND CORONARY ANGIOGRAPHY Left 05/15/2023   Procedure: LEFT HEART CATH AND CORONARY ANGIOGRAPHY;  Surgeon: Yvonne Kendall, MD;  Location: [facility] INVASIVE CV LAB;  Service: Cardiovascular;  Laterality: Left;   TONSILLECTOMY     TOTAL ABDOMINAL HYSTERECTOMY W/ BILATERAL SALPINGOOPHORECTOMY      SOCIAL HISTORY:   Social History   Tobacco Use   Smoking status: Former    Current packs/day: 0.00    Types: Cigarettes    Quit date: 04/25/1975    Years since quitting: 48.2   Smokeless tobacco: Never    Tobacco comments:    quit 40 years ago  Substance Use Topics   Alcohol use: No    Alcohol/week: 0.0 standard drinks of alcohol    FAMILY HISTORY:   Family History  Problem Relation Age of Onset   Colon cancer Mother    Stroke Father    Heart failure Sister    Bladder Cancer Neg Hx    Kidney disease Neg Hx    Prostate cancer Neg Hx    Kidney cancer Neg Hx    Pancreatic cancer Neg Hx    Esophageal cancer Neg Hx    Stomach cancer Neg Hx    Rectal cancer Neg Hx    Breast cancer Neg Hx     DRUG ALLERGIES:   Allergies  Allergen Reactions   Avelox [Moxifloxacin Hcl In Nacl] Anaphylaxis   Bactrim [Sulfamethoxazole-Trimethoprim] Anaphylaxis   Ciprofloxacin Other (See Comments)    Pt states she was told never to take this as it is in the same family as Avelox.    Buspar [Buspirone] Other (See Comments)    hallucinations   Neomycin-Polymyxin-Dexameth Other (See Comments)    Burning in the eyes   Aquaphor [Lanolin-Petrolatum] Itching and Rash   Depakote [Divalproex Sodium] Other (See Comments)    Unknown Reaction   Imitrex [Sumatriptan] Other (See Comments)    Neck and shoulder pain   Stadol [Butorphanol] Rash    REVIEW OF SYSTEMS:   ROS As per history of present illness. All pertinent systems were reviewed above. Constitutional, HEENT, cardiovascular, respiratory, GI, GU, musculoskeletal, neuro, psychiatric, endocrine, integumentary and hematologic systems were reviewed and are otherwise negative/unremarkable except for positive findings mentioned above in the HPI.   MEDICATIONS AT HOME:   Prior to Admission medications   Medication Sig Start Date End Date Taking? Authorizing Provider  ALPRAZolam Prudy Feeler) 0.5 MG tablet Take 1 tablet (0.5 mg total) by mouth 2 (two) times daily as needed for anxiety. Must last 15 days. 06/27/23   Cherie Ouch, PA-C  aspirin EC 81 MG tablet Take 1 tablet (81 mg) by mouth once daily. Swallow whole.    [provider]  clopidogrel  (PLAVIX) 75 MG tablet Take 1 tablet (75 mg total) by mouth daily with breakfast. 05/16/23   Charlsie Quest, NP  conjugated estrogens (PREMARIN) vaginal cream 3 (three) times a week 02/18/23   [provider]  furosemide (LASIX) 40 MG tablet Take 1 tablet (40 mg total) by mouth 2 (two) times daily. 06/19/23   End, Cristal Deer, MD  isosorbide mononitrate (IMDUR) 30 MG 24 hr tablet Take 0.5 tablets (15 mg total) by mouth daily. 06/04/23 09/02/23  Sondra Barges, PA-C  metoprolol tartrate (LOPRESSOR) 25 MG tablet Take 1  tablet (25 mg total) by mouth 2 (two) times daily. 05/30/23 08/28/23  End, Cristal Deer, MD  nitroGLYCERIN (NITROSTAT) 0.4 MG SL tablet Place 1 tablet (0.4 mg total) under the tongue every 5 (five) minutes as needed. 05/13/23 08/11/23  End, Cristal Deer, MD  ondansetron (ZOFRAN-ODT) 8 MG disintegrating tablet Take 1 tablet (8 mg total) by mouth every 8 (eight) hours as needed for nausea or vomiting. 06/18/23   [name], NP  pantoprazole (PROTONIX) 40 MG tablet Take 1 tablet (40 mg total) by mouth daily. 06/20/23   End, Cristal Deer, MD  potassium chloride SA (KLOR-CON M20) 20 MEQ tablet Take 1 tablet (20 mEq total) by mouth 2 (two) times daily. 06/19/23 09/17/23  End, Cristal Deer, MD  pramipexole (MIRAPEX) 1 MG tablet Take 1 tablet (1 mg total) by mouth at bedtime. 02/15/23   Cannady, Corrie Dandy T, NP  pregabalin (LYRICA) 25 MG capsule Start taking Lyrica 25 mg nightly for 1 week, then increase to 25 mg twice daily and continue for neuropathy. 06/03/23   [provider]  QUEtiapine (SEROQUEL) 300 MG tablet Take 1 tablet (300 mg total) by mouth at bedtime. 06/06/23   Cherie Ouch, PA-C  Rimegepant Sulfate 75 MG TBDP Take 75 mg as needed for headache rescue 06/03/23   [provider]  rOPINIRole (REQUIP) 1 MG tablet Take 1 mg by mouth 2 (two) times daily. 05/21/23   [provider]  rosuvastatin (CRESTOR) 5 MG tablet Take 1 tablet by mouth once daily 11/30/22    [name], NP  sertraline (ZOLOFT) 50 MG tablet Take 1 tablet (50 mg total) by mouth daily. 05/16/23   Hammock, Lavonna Rua, NP  sitaGLIPtin (JANUVIA) 100 MG tablet Take 1 tablet (100 mg total) by mouth daily. 02/18/23   Cannady, Corrie Dandy T, NP  tirzepatide [facility]) 2.5 MG/0.5ML Pen Inject 2.5 mg into the skin once a week. 06/26/23   [name], NP  tirzepatide [facility]) 5 MG/0.5ML Pen Inject 5 mg into the skin once a week. 06/26/23   [name], NP  vedolizumab (ENTYVIO) 300 MG injection Inject into the vein. Every 4 weeks    [provider]      VITAL SIGNS:  Blood pressure 106/77, pulse 87, temperature 98.2 F (36.8 C), temperature source Oral, resp. rate 11, SpO2 98%.  PHYSICAL EXAMINATION:  Physical Exam  GENERAL:  67 y.o.-year-old Caucasian female patient lying in the bed with no acute distress.  EYES: Pupils equal, round, reactive to light and accommodation. No scleral icterus. Extraocular muscles intact.  HEENT: Head atraumatic, normocephalic. Oropharynx and nasopharynx clear.  NECK:  Supple, no jugular venous distention. No thyroid enlargement, no tenderness.  LUNGS: Normal breath sounds bilaterally, no wheezing, rales,rhonchi or crepitation. No use of accessory muscles of respiration.  CARDIOVASCULAR: Regular rate and rhythm, S1, S2 normal. No murmurs, rubs, or gallops.  ABDOMEN: Soft, nondistended, nontender. Bowel sounds present. No organomegaly or mass.  EXTREMITIES: No pedal edema, cyanosis, or clubbing.  NEUROLOGIC: Cranial nerves II through XII are intact. Muscle strength 5/5 in all extremities. Sensation intact. Gait not checked.  PSYCHIATRIC: The patient is alert and oriented x 3.  Normal affect and good eye contact. SKIN: No obvious rash, lesion, or ulcer.   LABORATORY PANEL:   CBC Recent Labs  Lab 06/28/23 2127  WBC 5.0  HGB 11.1*  HCT 33.0*  PLT 324    ------------------------------------------------------------------------------------------------------------------  Chemistries  Recent Labs  Lab 06/28/23 2127  NA 131*  K 3.2*  CL 96*  CO2 25  GLUCOSE  314*  BUN 15  CREATININE 0.70  CALCIUM 8.3*   ------------------------------------------------------------------------------------------------------------------  Cardiac Enzymes No results for input(s): "TROPONINI" in the last 168 hours. ------------------------------------------------------------------------------------------------------------------  RADIOLOGY:  No results found.    IMPRESSION AND PLAN:  Assessment and Plan: * Type 2 diabetes mellitus with hyperosmolar nonketotic hyperglycemia (HCC) - This is associated with nonketotic hyperosmolar hyperglycemia. - The patient will be admitted to stepdown unit bed. - We will continue her on IV insulin drip. - Will continue aggressive hydration with IV lactated ringer. - Potassium replacement will be provided. - We will follow BMPs.  Recurrent UTI - The patient will be placed on IV Rocephin and will follow urine culture and sensitivity.  Coronary artery disease - We will continue aspirin and Plavix, Imdur and Lopressor.  Peripheral neuropathy - We will continue Lyrica.  Anxiety - We will continue Xanax.  GERD without esophagitis - Will continue PPI therapy.  Restless leg syndrome - We will continue Mirapex and Requip.    DVT prophylaxis: Lovenox. Advanced Care Planning:  Code Status: full code. Family Communication:  The plan of care was discussed in details with the patient (and family). I answered all questions. The patient agreed to proceed with the above mentioned plan. Further management will depend upon hospital course. Disposition Plan: Back to previous home environment Consults called: none. All the records are reviewed and case discussed with ED provider.  Status is: Inpatient   At the  time of the admission, it appears that the appropriate admission status for this patient is inpatient.  This is judged to be reasonable and necessary in order to provide the required intensity of service to ensure the patient's safety given the presenting symptoms, physical exam findings and initial radiographic and laboratory data in the context of comorbid conditions.  The patient requires inpatient status due to high intensity of service, high risk of further deterioration and high frequency of surveillance required.  I certify that at the time of admission, it is my clinical judgment that the patient will require inpatient hospital care extending more than 2 midnights.                            Dispo: The patient is from: Home              Anticipated d/c is to: Home              Patient currently is not medically stable to d/c.              Difficult to place patient: No  Authorized and performed by: Valente David, MD Total critical care time:    45    minutes. Due to a high probability of clinically significant, life-threatening deterioration, the patient required my highest level of preparedness to intervene emergently and I personally spent this critical care time directly and personally managing the patient.  This critical care time included obtaining a history, examining the patient, pulse oximetry, ordering and review of studies, arranging urgent treatment with development of management plan, evaluation of patient's response to treatment, frequent reassessment, and discussions with other providers. This critical care time was performed to assess and manage the high probability of imminent, life-threatening deterioration that could result in multiorgan failure.  It was exclusive of separately billable procedures and treating other patients and teaching time.   Hannah Beat M.D on 06/28/2023 at 11:22 PM  [facility]   From 7 PM-7 AM,  contact night-coverage [email or web address]  CC: Primary  care physician; [name], NP

## 2023-06-28 NOTE — Assessment & Plan Note (Signed)
-   We will continue Xanax. 

## 2023-06-28 NOTE — Assessment & Plan Note (Signed)
-   We will continue Lyrica. 

## 2023-06-28 NOTE — Assessment & Plan Note (Signed)
-   We will continue aspirin and Plavix, Imdur and Lopressor.

## 2023-06-28 NOTE — ED Notes (Signed)
Lab called with an elevated glucose level of 626

## 2023-06-29 DIAGNOSIS — F419 Anxiety disorder, unspecified: Secondary | ICD-10-CM | POA: Diagnosis not present

## 2023-06-29 DIAGNOSIS — N390 Urinary tract infection, site not specified: Secondary | ICD-10-CM | POA: Diagnosis not present

## 2023-06-29 DIAGNOSIS — Z7982 Long term (current) use of aspirin: Secondary | ICD-10-CM | POA: Diagnosis not present

## 2023-06-29 DIAGNOSIS — E1100 Type 2 diabetes mellitus with hyperosmolarity without nonketotic hyperglycemic-hyperosmolar coma (NKHHC): Secondary | ICD-10-CM | POA: Diagnosis not present

## 2023-06-29 DIAGNOSIS — K219 Gastro-esophageal reflux disease without esophagitis: Secondary | ICD-10-CM | POA: Diagnosis not present

## 2023-06-29 DIAGNOSIS — E1142 Type 2 diabetes mellitus with diabetic polyneuropathy: Secondary | ICD-10-CM | POA: Diagnosis not present

## 2023-06-29 DIAGNOSIS — G2581 Restless legs syndrome: Secondary | ICD-10-CM | POA: Diagnosis not present

## 2023-06-29 DIAGNOSIS — Z794 Long term (current) use of insulin: Secondary | ICD-10-CM | POA: Diagnosis not present

## 2023-06-29 DIAGNOSIS — E1165 Type 2 diabetes mellitus with hyperglycemia: Secondary | ICD-10-CM | POA: Diagnosis not present

## 2023-06-29 DIAGNOSIS — R739 Hyperglycemia, unspecified: Secondary | ICD-10-CM | POA: Diagnosis not present

## 2023-06-29 DIAGNOSIS — I2510 Atherosclerotic heart disease of native coronary artery without angina pectoris: Secondary | ICD-10-CM | POA: Diagnosis not present

## 2023-06-29 DIAGNOSIS — E876 Hypokalemia: Secondary | ICD-10-CM | POA: Diagnosis not present

## 2023-06-29 DIAGNOSIS — E559 Vitamin D deficiency, unspecified: Secondary | ICD-10-CM | POA: Diagnosis not present

## 2023-06-29 LAB — BASIC METABOLIC PANEL
Anion gap: 7 (ref 5–15)
BUN: 14 mg/dL (ref 8–23)
CO2: 27 mmol/L (ref 22–32)
Calcium: 8.1 mg/dL — ABNORMAL LOW (ref 8.9–10.3)
Chloride: 102 mmol/L (ref 98–111)
Creatinine, Ser: 0.57 mg/dL (ref 0.44–1.00)
GFR, Estimated: >60 mL/min (ref >60)
Glucose, Bld: 129 mg/dL — ABNORMAL HIGH (ref 70–99)
Potassium: 3.2 mmol/L — ABNORMAL LOW (ref 3.5–5.1)
Sodium: 136 mmol/L (ref 135–145)

## 2023-06-29 LAB — GLUCOSE, CAPILLARY
Glucose-Capillary: 117 mg/dL — ABNORMAL HIGH (ref 70–99)
Glucose-Capillary: 124 mg/dL — ABNORMAL HIGH (ref 70–99)
Glucose-Capillary: 134 mg/dL — ABNORMAL HIGH (ref 70–99)
Glucose-Capillary: 137 mg/dL — ABNORMAL HIGH (ref 70–99)
Glucose-Capillary: 138 mg/dL — ABNORMAL HIGH (ref 70–99)
Glucose-Capillary: 150 mg/dL — ABNORMAL HIGH (ref 70–99)
Glucose-Capillary: 151 mg/dL — ABNORMAL HIGH (ref 70–99)
Glucose-Capillary: 153 mg/dL — ABNORMAL HIGH (ref 70–99)
Glucose-Capillary: 155 mg/dL — ABNORMAL HIGH (ref 70–99)
Glucose-Capillary: 161 mg/dL — ABNORMAL HIGH (ref 70–99)
Glucose-Capillary: 183 mg/dL — ABNORMAL HIGH (ref 70–99)
Glucose-Capillary: 311 mg/dL — ABNORMAL HIGH (ref 70–99)

## 2023-06-29 MED ORDER — FUROSEMIDE 40 MG PO TABS: 20.0000 mg | ORAL_TABLET | Freq: Two times a day (BID) | ORAL | 1 refills | Status: DC

## 2023-06-29 MED ORDER — VITAMIN D 25 MCG (1000 UNIT) PO TABS
1000.0000 [IU] | ORAL_TABLET | Freq: Every day | ORAL | Status: DC
  Administered 2023-06-29: 1000 [IU] via ORAL
  Filled 2023-06-29: qty 1

## 2023-06-29 MED ORDER — INSULIN ASPART 100 UNIT/ML IJ SOLN
0.0000 [IU] | Freq: Every day | INTRAMUSCULAR | Status: DC
Start: 2023-06-29 — End: 2023-06-29

## 2023-06-29 MED ORDER — POTASSIUM CHLORIDE 20 MEQ PO PACK
40.0000 meq | PACK | Freq: Once | ORAL | Status: DC
  Filled 2023-06-29: qty 2

## 2023-06-29 MED ORDER — CHLORHEXIDINE GLUCONATE CLOTH 2 % EX PADS: 6.0000 | MEDICATED_PAD | Freq: Every day | CUTANEOUS | Status: DC

## 2023-06-29 MED ORDER — VITAMIN D3 25 MCG PO TABS: 1000.0000 [IU] | ORAL_TABLET | Freq: Every day | ORAL | 3 refills | Status: DC

## 2023-06-29 MED ORDER — INSULIN GLARGINE-YFGN 100 UNIT/ML ~~LOC~~ SOLN
15.0000 [IU] | Freq: Every day | SUBCUTANEOUS | Status: DC
  Administered 2023-06-29: 15 [IU] via SUBCUTANEOUS
  Filled 2023-06-29: qty 0.15

## 2023-06-29 MED ORDER — INSULIN GLARGINE 100 UNIT/ML SOLOSTAR PEN: 15.0000 [IU] | PEN_INJECTOR | Freq: Every day | SUBCUTANEOUS | 11 refills | Status: DC

## 2023-06-29 MED ORDER — POTASSIUM CHLORIDE CRYS ER 20 MEQ PO TBCR
40.0000 meq | EXTENDED_RELEASE_TABLET | Freq: Once | ORAL | Status: AC
  Administered 2023-06-29: 40 meq via ORAL
  Filled 2023-06-29 (×2): qty 2

## 2023-06-29 MED ORDER — INSULIN ASPART 100 UNIT/ML IJ SOLN: 0.0000 [IU] | Freq: Three times a day (TID) | INTRAMUSCULAR | Status: DC

## 2023-06-29 MED ORDER — INSULIN ASPART 100 UNIT/ML IJ SOLN
0.0000 [IU] | Freq: Three times a day (TID) | INTRAMUSCULAR | Status: DC
  Administered 2023-06-29: 11 [IU] via SUBCUTANEOUS

## 2023-06-29 MED ORDER — CEPHALEXIN 250 MG PO CAPS
250.0000 mg | ORAL_CAPSULE | Freq: Every day | ORAL | Status: DC
  Administered 2023-06-29: 250 mg via ORAL
  Filled 2023-06-29: qty 1

## 2023-06-29 MED ORDER — FUROSEMIDE 20 MG PO TABS
20.0000 mg | ORAL_TABLET | Freq: Two times a day (BID) | ORAL | Status: DC
  Administered 2023-06-29: 20 mg via ORAL
  Filled 2023-06-29: qty 1

## 2023-06-29 NOTE — Progress Notes (Signed)
DC instructions provided to patient and husband; all questions answered. [facility] via [facility] for husband to DC home. All PIV's removed; no bleeding or complications.

## 2023-06-29 NOTE — Discharge Instructions (Addendum)
Patient will continue Januvia and take Lantus Solostar every day till her Amy Keen PA is completed by PCP patient advised to keep log of sugars at home. Discussed further diabetes management with your nurse practitioner

## 2023-06-29 NOTE — Plan of Care (Signed)
  Problem: Education: Goal: Ability to describe self-care measures that may prevent or decrease complications (Diabetes Survival Skills Education) will improve Outcome: Progressing   Problem: Coping: Goal: Ability to adjust to condition or change in health will improve Outcome: Progressing   Problem: Fluid Volume: Goal: Ability to maintain a balanced intake and output will improve Outcome: Progressing   Problem: Metabolic: Goal: Ability to maintain appropriate glucose levels will improve Outcome: Progressing   Problem: Nutritional: Goal: Maintenance of adequate nutrition will improve Outcome: Not Progressing    Problem: Skin Integrity: Goal: Risk for impaired skin integrity will decrease Outcome: Progressing   Problem: Tissue Perfusion: Goal: Adequacy of tissue perfusion will improve Outcome: Progressing   Problem: Education: Goal: Ability to describe self-care measures that may prevent or decrease complications (Diabetes Survival Skills Education) will improve Outcome: Progressing   Problem: Cardiac: Goal: Ability to maintain an adequate cardiac output will improve Outcome: Progressing

## 2023-06-29 NOTE — Discharge Summary (Addendum)
Physician Discharge Summary   Patient: Amy Hansen MRN: [id number] DOB: [birth year]  Admit date:     06/28/2023  Discharge date: 06/29/23  Discharge Physician: Enedina Finner   PCP: [name], NP   Recommendations at discharge:     --Patient will continue Januvia and take Lantus Solostar every day till her Greggory Keen PA is completed by PCP --patient advised to keep log of sugars at home. Discuss further diabetes management with your nurse practitioner -- follow-up PCP in one week  Discharge Diagnoses: Principal Problem:   Type 2 diabetes mellitus with hyperosmolar nonketotic hyperglycemia (HCC) Active Problems:   Recurrent UTI   Coronary artery disease   Peripheral neuropathy   Anxiety   Restless leg syndrome   GERD without esophagitis  Amy Hansen is a 67 y.o. Caucasian female with medical history significant for type 2 diabetes mellitus, CHF, diverticulosis, RLS, anxiety and TIA, who presented to the emergency room with acute onset of glycemia with blood glucose reading more than 600.   Patient was not able to get her prescription for Moounjara filled and due to prior authorization with the holiday weekend.  Type 2 diabetes mellitus with hyperosmolar nonketotic hyperglycemia (HCC) hypokalemia -- patient was on insulin drip. Now change to Lantus 15 units daily. -- Sugars much improved -- replace potassium -- discussed with patient to continue Lantus daily for now till her prior authorization is completed for [facility] and defer diabetes management per nurse practitioner as outpatient. Patient did voice understanding --will resume Januvia   Recurrent UTI chronic UTI prophylaxis - patient currently does not have symptoms of dysuria. -- Will switch her back to Keflex 250 PO daily that she takes for prophylaxis prescribed by urology   Coronary artery disease -  continue aspirin and Plavix, Imdur and Lopressor.   Peripheral neuropathy -  continue Lyrica.    Anxiety - continue Xanax. -- Patient follows with psychiatry as outpatient   GERD without esophagitis -  continue PPI therapy.   Restless leg syndrome - continue Mirapex and Requip.  Vitamin D deficiency --pt started on po vitamin d3  Overall feeling better. So far hemodynamically stable. She is eager to go home.          Disposition: home Diet recommendation:  Discharge Diet Orders (From admission, onward)     Start     Ordered   06/29/23 0000  Diet - low sodium heart healthy        06/29/23 1032   06/29/23 0000  Diet Carb Modified        06/29/23 1032           Cardiac and Carb modified diet DISCHARGE MEDICATION: Allergies as of 06/29/2023       Reactions   Avelox [moxifloxacin Hcl In Nacl] Anaphylaxis   Bactrim [sulfamethoxazole-trimethoprim] Anaphylaxis   Ciprofloxacin Other (See Comments)   Pt states she was told never to take this as it is in the same family as Avelox.    Buspar [buspirone] Other (See Comments)   hallucinations   Neomycin-polymyxin-dexameth Other (See Comments)   Burning in the eyes   Aquaphor [lanolin-petrolatum] Itching, Rash   Depakote [divalproex Sodium] Other (See Comments)   Unknown Reaction   Imitrex [sumatriptan] Other (See Comments)   Neck and shoulder pain   Stadol [butorphanol] Rash        Medication List     STOP taking these medications    pregabalin 25 MG capsule Commonly known as: LYRICA  TAKE these medications    ALPRAZolam 0.5 MG tablet Commonly known as: XANAX Take 1 tablet (0.5 mg total) by mouth 2 (two) times daily as needed for anxiety. Must last 15 days.   aspirin EC 81 MG tablet Take 1 tablet (81 mg) by mouth once daily. Swallow whole.   cephALEXin 250 MG capsule Commonly known as: KEFLEX Take 250 mg by mouth daily.   clopidogrel 75 MG tablet Commonly known as: PLAVIX Take 1 tablet (75 mg total) by mouth daily with breakfast.   furosemide 40 MG tablet Commonly known as:  LASIX Take 0.5 tablets (20 mg total) by mouth 2 (two) times daily. What changed: how much to take   insulin glargine 100 UNIT/ML Solostar Pen Commonly known as: LANTUS Inject 15 Units into the skin daily. Start taking on: June 30, 2023   isosorbide mononitrate 30 MG 24 hr tablet Commonly known as: IMDUR Take 0.5 tablets (15 mg total) by mouth daily.   metoprolol tartrate 25 MG tablet Commonly known as: LOPRESSOR Take 1 tablet (25 mg total) by mouth 2 (two) times daily.   nitroGLYCERIN 0.4 MG SL tablet Commonly known as: NITROSTAT Place 1 tablet (0.4 mg total) under the tongue every 5 (five) minutes as needed.   ondansetron 8 MG disintegrating tablet Commonly known as: ZOFRAN-ODT Take 1 tablet (8 mg total) by mouth every 8 (eight) hours as needed for nausea or vomiting.   pantoprazole 40 MG tablet Commonly known as: PROTONIX Take 1 tablet (40 mg total) by mouth daily.   potassium chloride SA 20 MEQ tablet Commonly known as: [place]-Con M20 Take 1 tablet (20 mEq total) by mouth 2 (two) times daily.   pramipexole 1 MG tablet Commonly known as: MIRAPEX Take 1 tablet (1 mg total) by mouth at bedtime.   QUEtiapine 300 MG tablet Commonly known as: SEROquel Take 1 tablet (300 mg total) by mouth at bedtime.   rOPINIRole 1 MG tablet Commonly known as: REQUIP Take 1 mg by mouth 2 (two) times daily.   rosuvastatin 5 MG tablet Commonly known as: CRESTOR Take 1 tablet by mouth once daily   sertraline 50 MG tablet Commonly known as: ZOLOFT Take 1 tablet (50 mg total) by mouth daily.   sitaGLIPtin 100 MG tablet Commonly known as: Januvia Take 1 tablet (100 mg total) by mouth daily.   tirzepatide 2.5 MG/0.5ML Pen Commonly known as: MOUNJARO Inject 2.5 mg into the skin once a week.   tirzepatide 5 MG/0.5ML Pen Commonly known as: MOUNJARO Inject 5 mg into the skin once a week.   vitamin D3 25 MCG tablet Commonly known as: CHOLECALCIFEROL Take 1 tablet (1,000 Units  total) by mouth daily. Start taking on: June 30, 2023        Follow-up Information     [name], NP. Schedule an appointment as soon as possible for a visit in 1 week(s).   Specialty: Nurse Practitioner Contact information: [address] [place] [address] [phone]                Discharge Exam: Ceasar Mons Weights   06/28/23 2058  Weight: 50.9 kg   Alert and oriented times three respiratory clear to auscultation cardiovascular both heart sounds normal neuro- grossly intact   Condition at discharge: fair  The results of significant diagnostics from this hospitalization (including imaging, microbiology, ancillary and laboratory) are listed below for reference.   Imaging Studies: No results found.  Microbiology: Results for orders placed or performed during the hospital  encounter of 06/28/23  MRSA Next Gen by PCR, Nasal     Status: None   Collection Time: 06/28/23  8:59 PM   Specimen: Nasal Mucosa; Nasal Swab  Result Value Ref Range Status   MRSA by PCR Next Gen NOT DETECTED NOT DETECTED Final    Comment: (NOTE) The GeneXpert MRSA Assay (FDA approved for NASAL specimens only), is one component of a comprehensive MRSA colonization surveillance program. It is not intended to diagnose MRSA infection nor to guide or monitor treatment for MRSA infections. Test performance is not FDA approved in patients less than 74 years old. Performed at [facility], [address] [place], [place] [address]    *Note: Due to a large number of results and/or encounters for the requested time period, some results have not been displayed. A complete set of results can be found in Results Review.    Labs: CBC: Recent Labs  Lab 06/28/23 1735 06/28/23 2127  WBC 5.0 5.0  HGB 12.6 11.1*  HCT 39.4 33.0*  MCV 91.4 87.3  PLT 383 324   Basic Metabolic Panel: Recent Labs  Lab 06/28/23 1735 06/28/23 2127 06/29/23 0346  NA 125* 131* 136  K 3.9 3.2*  3.2*  CL 92* 96* 102  CO2 21* 25 27  GLUCOSE 626* 314* 129*  BUN 16 15 14   CREATININE 0.93 0.70 0.57  CALCIUM 8.8* 8.3* 8.1*   CBG: Recent Labs  Lab 06/29/23 0532 06/29/23 0629 06/29/23 0742 06/29/23 0855 06/29/23 0942  GLUCAP 151* 137* 134* 153* 161*    Discharge time spent: greater than 30 minutes.  Signed: Enedina Finner, MD [facility] 06/29/2023

## 2023-06-29 NOTE — Inpatient Diabetes Management (Signed)
Inpatient Diabetes Program Recommendations  AACE/ADA: New Consensus Statement on Inpatient Glycemic Control (2015)  Target Ranges:  Prepandial:   less than 140 mg/dL      Peak postprandial:   less than 180 mg/dL (1-2 hours)      Critically ill patients:  140 - 180 mg/dL   Lab Results  Component Value Date   GLUCAP 311 (H) 06/29/2023   HGBA1C 7.1 (H) 05/16/2023    Review of Glycemic Control  Latest Reference Range & Units 06/29/23 04:29 06/29/23 05:32 06/29/23 06:29 06/29/23 07:42 06/29/23 08:55 06/29/23 09:42 06/29/23 11:10  Glucose-Capillary 70 - 99 mg/dL 578 (H) 469 (H) 629 (H) 134 (H) 153 (H) 161 (H) 311 (H)  (H): Data is abnormally high  Diabetes history: DM Outpatient Diabetes medications: Januvia 100 mg every day, Mounjaro  Current orders for Inpatient glycemic control: Transitioned to SQ insulin from IV insulin-Semglee 15 units QD  Noted consult for Diabetes Coordinator. Diabetes Coordinator is not on campus over the weekend but available by pager from 8am to 5pm for questions or concerns. Chart reviewed.   Spoke with patient over the phone.  She is discharging today with Lantus 15 units every day.  She is in the process of obtaining Mounjaro; it is requiring a PA.  She was prescribed Mounjaro on Monday by her PCP.    She has used insulin in the past with the insulin pen and is aware how to administer.  She should take it once daily QAM.  She will reach out to her PCP on Monday.  If she is unable to obtain Lantus at the pharmacy due to a PA, she can obtain NPH insulin at [facility] in the meantime.  The pharmacist can assist her with dosing compared to her Lantus dose.  Attached the [employer] brand information to her DC paperwork.    Reviewed hypoglycemia, < 70 mg/dL, signs, symptoms and treatments.   Will continue to follow while inpatient.  Thank you, Dulce Sellar, MSN, CDCES Diabetes Coordinator Inpatient Diabetes Program [phone] (team pager from 8a-5p)

## 2023-06-30 ENCOUNTER — Encounter: Payer: Self-pay | Admitting: Nurse Practitioner

## 2023-07-01 ENCOUNTER — Telehealth (HOSPITAL_COMMUNITY): Payer: Self-pay | Admitting: Pharmacy Technician

## 2023-07-01 ENCOUNTER — Other Ambulatory Visit (HOSPITAL_COMMUNITY): Payer: Self-pay

## 2023-07-01 ENCOUNTER — Emergency Department
Admission: EM | Admit: 2023-07-01 | Discharge: 2023-07-01 | Disposition: A | Discharge status: Home / Self Care | Payer: BC Managed Care – PPO | Attending: Emergency Medicine | Admitting: Emergency Medicine

## 2023-07-01 ENCOUNTER — Other Ambulatory Visit: Payer: Self-pay

## 2023-07-01 ENCOUNTER — Telehealth: Payer: Self-pay | Admitting: Nurse Practitioner

## 2023-07-01 DIAGNOSIS — R739 Hyperglycemia, unspecified: Secondary | ICD-10-CM | POA: Diagnosis not present

## 2023-07-01 DIAGNOSIS — E1165 Type 2 diabetes mellitus with hyperglycemia: Secondary | ICD-10-CM | POA: Diagnosis not present

## 2023-07-01 LAB — BLOOD GAS, VENOUS
Acid-Base Excess: 3.2 mmol/L — ABNORMAL HIGH (ref 0.0–2.0)
Bicarbonate: 30.6 mmol/L — ABNORMAL HIGH (ref 20.0–28.0)
O2 Saturation: 21.4 %
Patient temperature: 37
pCO2, Ven: 58 mm[Hg] (ref 44–60)
pH, Ven: 7.33 (ref 7.25–7.43)

## 2023-07-01 LAB — URINE CULTURE: Culture: >=100000 — AB

## 2023-07-01 LAB — CBC
HCT: 35.6 % — ABNORMAL LOW (ref 36.0–46.0)
Hemoglobin: 11.3 g/dL — ABNORMAL LOW (ref 12.0–15.0)
MCH: 29.7 pg (ref 26.0–34.0)
MCHC: 31.7 g/dL (ref 30.0–36.0)
MCV: 93.7 fL (ref 80.0–100.0)
Platelets: 331 10*3/uL (ref 150–400)
RBC: 3.8 MIL/uL — ABNORMAL LOW (ref 3.87–5.11)
RDW: 13.4 % (ref 11.5–15.5)
WBC: 4.8 10*3/uL (ref 4.0–10.5)
nRBC: 0 % (ref 0.0–0.2)

## 2023-07-01 LAB — CBG MONITORING, ED
Glucose-Capillary: 397 mg/dL — ABNORMAL HIGH (ref 70–99)
Glucose-Capillary: 247 mg/dL — ABNORMAL HIGH (ref 70–99)

## 2023-07-01 LAB — COMPREHENSIVE METABOLIC PANEL
ALT: 43 U/L (ref 0–44)
AST: 47 U/L — ABNORMAL HIGH (ref 15–41)
Albumin: 3.2 g/dL — ABNORMAL LOW (ref 3.5–5.0)
Alkaline Phosphatase: 158 U/L — ABNORMAL HIGH (ref 38–126)
Anion gap: 8 (ref 5–15)
BUN: 18 mg/dL (ref 8–23)
CO2: 26 mmol/L (ref 22–32)
Calcium: 9 mg/dL (ref 8.9–10.3)
Chloride: 98 mmol/L (ref 98–111)
Creatinine, Ser: 0.8 mg/dL (ref 0.44–1.00)
GFR, Estimated: >60 mL/min (ref >60)
Glucose, Bld: 433 mg/dL — ABNORMAL HIGH (ref 70–99)
Potassium: 4.1 mmol/L (ref 3.5–5.1)
Sodium: 132 mmol/L — ABNORMAL LOW (ref 135–145)
Total Bilirubin: 0.6 mg/dL (ref <1.2)
Total Protein: 6.7 g/dL (ref 6.5–8.1)

## 2023-07-01 LAB — BETA-HYDROXYBUTYRIC ACID: Beta-Hydroxybutyric Acid: <0.05 mmol/L — ABNORMAL LOW (ref 0.05–0.27)

## 2023-07-01 MED ORDER — SODIUM CHLORIDE 0.9 % IV BOLUS
1000.0000 mL | Freq: Once | INTRAVENOUS | Status: AC
  Administered 2023-07-01: 1000 mL via INTRAVENOUS

## 2023-07-01 MED ORDER — PEN NEEDLES 3/16" 31G X 5 MM MISC: 1.0000 [IU] | 2 refills | Status: DC | PRN

## 2023-07-01 MED ORDER — INSULIN ASPART 100 UNIT/ML IJ SOLN
3.0000 [IU] | Freq: Once | INTRAMUSCULAR | Status: DC
  Filled 2023-07-01: qty 1

## 2023-07-01 MED ORDER — INSULIN GLARGINE 100 UNIT/ML SOLOSTAR PEN: 17.0000 [IU] | PEN_INJECTOR | Freq: Every day | SUBCUTANEOUS | 11 refills | Status: DC

## 2023-07-01 MED ORDER — INSULIN ASPART 100 UNIT/ML IJ SOLN
3.0000 [IU] | Freq: Once | INTRAMUSCULAR | Status: AC
  Administered 2023-07-01: 3 [IU] via SUBCUTANEOUS

## 2023-07-01 MED ORDER — NOVOLOG FLEXPEN 100 UNIT/ML ~~LOC~~ SOPN: 1.0000 [IU] | PEN_INJECTOR | Freq: Three times a day (TID) | SUBCUTANEOUS | 11 refills | Status: DC

## 2023-07-01 NOTE — Discharge Instructions (Signed)
Please seek medical attention for any high fevers, chest pain, shortness of breath, change in behavior, persistent vomiting, bloody stool or any other new or concerning symptoms.  

## 2023-07-01 NOTE — Telephone Encounter (Signed)
Attempted to reach patient, LVM to call office back to schedule appointment regarding her sugar levels being high.  Put in CRM.

## 2023-07-01 NOTE — ED Provider Notes (Signed)
[facility] Provider Note    Event Date/Time   First MD Initiated Contact with Patient 07/01/23 [phone]     (approximate)   History   Hyperglycemia   HPI  Amy Hansen is a 67 y.o. female who presented to the emergency department today because of concerns for elevated blood sugar.  The patient had been admitted to the hospital over the weekend for elevated blood sugar.  She states when she was discharged she was given a long-acting insulin however no short acting insulin.  This morning her blood sugar was reading high on her glucometer.  She denies any nausea or vomiting.  Denies any fevers.     Physical Exam   Triage Vital Signs: ED Triage Vitals [07/01/23 0909]  Encounter Vitals Group     BP 110/66     Systolic BP Percentile      Diastolic BP Percentile      Pulse Rate 100     Resp 18     Temp 97.8 F (36.6 C)     Temp src      SpO2 96 %     Weight 110 lb 3.7 oz (50 kg)     Height 5' (1.524 m)     Head Circumference      Peak Flow      Pain Score 0   Most recent vital signs: Vitals:   07/01/23 0909  BP: 110/66  Pulse: 100  Resp: 18  Temp: 97.8 F (36.6 C)  SpO2: 96%   General: Awake, alert, oriented. CV:  Good peripheral perfusion. Regular rate and rhythm. Resp:  Normal effort. Lungs clear. Abd:  No distention.    ED Results / Procedures / Treatments   Labs (all labs ordered are listed, but only abnormal results are displayed) Labs Reviewed  CBC - Abnormal; Notable for the following components:      Result Value   RBC 3.80 (*)    Hemoglobin 11.3 (*)    HCT 35.6 (*)    All other components within normal limits  COMPREHENSIVE METABOLIC PANEL - Abnormal; Notable for the following components:   Sodium 132 (*)    Glucose, Bld 433 (*)    Albumin 3.2 (*)    AST 47 (*)    Alkaline Phosphatase 158 (*)    All other components within normal limits  CBG MONITORING, ED - Abnormal; Notable for the following components:    Glucose-Capillary 397 (*)    All other components within normal limits  BETA-HYDROXYBUTYRIC ACID  BLOOD GAS, VENOUS     EKG  None   RADIOLOGY None   PROCEDURES:  Critical Care performed: No    MEDICATIONS ORDERED IN ED: Medications  sodium chloride 0.9 % bolus 1,000 mL (1,000 mLs Intravenous New Bag/Given 07/01/23 0934)     IMPRESSION / MDM / ASSESSMENT AND PLAN / ED COURSE  I reviewed the triage vital signs and the nursing notes.                              Differential diagnosis includes, but is not limited to, DKA, hyperglycemia  Patient's presentation is most consistent with acute presentation with potential threat to life or bodily function.   Patient presented to the emergency department today because of concerns for hyperglycemia.  Blood work without signs concerning for DKA.  She was given fluids which did start to bring her blood sugar down.  Did  ask the diabetes management to talk to the patient.  They were kind enough to come and evaluate the patient.  They did give recommendations for adding a short acting sliding scale.  Will give patient dose of insulin here and discharged with prescription for sliding scale.     FINAL CLINICAL IMPRESSION(S) / ED DIAGNOSES   Final diagnoses:  Hyperglycemia      Note:  This document was prepared using Dragon voice recognition software and may include unintentional dictation errors.    Phineas Semen, MD 07/01/23 [phone]

## 2023-07-01 NOTE — Telephone Encounter (Signed)
Patient Product/process development scientist completed.    The patient is insured through [facility]. Patient has [employer], may use a copay card, and/or apply for patient assistance if available.    Ran test claim for Novolog Flexpen and the current 30 day co-pay is $0.00.   This test claim was processed through [facility]- copay amounts may vary at other pharmacies due to pharmacy/plan contracts, or as the patient moves through the different stages of their insurance plan.     Roland Earl, CPHT Pharmacy Technician III Certified Patient Advocate [facility] Patient Advocate Team Direct Number: [phone]  Fax: [phone]

## 2023-07-01 NOTE — Inpatient Diabetes Management (Signed)
Inpatient Diabetes Program Recommendations  AACE/ADA: New Consensus Statement on Inpatient Glycemic Control (2015)  Target Ranges:  Prepandial:   less than 140 mg/dL      Peak postprandial:   less than 180 mg/dL (1-2 hours)      Critically ill patients:  140 - 180 mg/dL    Latest Reference Range & Units 07/01/23 09:05  Sodium 135 - 145 mmol/L 132 (L)  Potassium 3.5 - 5.1 mmol/L 4.1  Chloride 98 - 111 mmol/L 98  CO2 22 - 32 mmol/L 26  Glucose 70 - 99 mg/dL 010 (H)  BUN 8 - 23 mg/dL 18  Creatinine 2.72 - 5.36 mg/dL 6.44  Calcium 8.9 - 03.4 mg/dL 9.0  Anion gap 5 - 15  8  (L): Data is abnormally low (H): Data is abnormally high   To ED with Hyperglycemia (was just here 11/29 thru 11/30 with Hyperglycemia--Has issues getting Mounjaro (needed PA)--MD sent her home on Lantus 15 units Daily until PA for Mounjaro comes through)  Home: Lantus 15 units QAM + Januvia 100 mg Daily + Mounjaro (not taking yet)      Lab Glu 433 in the ED (AG 8/ CO2 26)--Beta-Hydroxy level Pending  Given 1L NS IVF bolus in the ED   PCP: [name], NP [facility]   MD/ RN: Recommend the following:  1. Check CBG post-IVF administration  2. If CBG >200, please give small dose of Novolog SSI based on the below scale  3. Increase pt's home Lantus dose very slightly to 17 units Daily  4. Pt needs Rx for Insulin Pen Needles for home: Order Number [id number]  4. Start Novolog SSI for pt for home TID with meals based on pre-meal CBG check Pt can get Novolog Insulin pens for $0 co-pay Novolog Order Number: [id number]  Novolog: 120-150- 1 unit 151-200- 2 units 201-250- 3 units 251-300- 4 units 301-350- 5 units 351-400- 6 units >400, take 6 units and repeat CBG in 1 hour   Met w/ pt in the ED.  Pt told me she has taken insulin in the past and does not mind taking insulin for now.  Did not have questions about using insulin pens at home.  Has taken Lantus 15 units daily at home the last  2 days.  States her CBG was 201 this AM prior to eating--Ate 1/2c cup mini-wheats and stated her CBG rose quickly to 600 range on her home CBG meter.  We discussed how she likely needs rapid-acting insulin at home to help manage her CBGs until she can get her Greggory Keen restarted (waiting on PA still).  Explained how Lantus (long-acting) insulin differs from Novolog/Humalog (rapid-acting insulin).  Explained that we can give pt SSI to use at home for the interim--explained what a sliding scale is and how it works.  Also explained to pt that after she restarts the [facility] at home, she may need to wean down and/or possibly stop the Lantus and possibly the Novolog.  Asked pt to keep track of her CBGs TID AC and to call her PCP if she has any issues with Hypoglycemia.  Reviewed HYPO signs and symptoms and how to treat HYPO as well.    --Will follow patient during hospitalization--  Ambrose Finland RN, MSN, CDCES Diabetes Coordinator Inpatient Glycemic Control Team Team Pager: [phone] (8a-5p)

## 2023-07-01 NOTE — ED Notes (Signed)
Diabetes Coordinator services contacted per EDP request, will visit bedside to discuss medications with patient and make recommendations to EDP for DC planning.

## 2023-07-01 NOTE — ED Notes (Signed)
Pt ambulatory to restroom

## 2023-07-01 NOTE — ED Triage Notes (Signed)
Pt to ED [facility] from home for hyperglycemia. Recently admitted and d/c for same. Has been taking long acting insulin, waiting on insurance approval for short acting insulin.  Pt alert and oriented. NAD noted

## 2023-07-01 NOTE — Telephone Encounter (Signed)
Attempted to reach patient, LVM to call office back to schedule appointment regarding her sugar levels being high.  I do see that she is already in the ER.  Just an FYI.

## 2023-07-01 NOTE — Telephone Encounter (Signed)
Patient called in regards to her medication, Mounjaro. Patient states her insurance company denied authorization so patient contacted her pharmacy and her pharmacy advised her to reach out to PCP office and have the nurse re-submit the authorization for [facility]. Patient states [employer] was closed for the Holiday, but she will be reaching out to them to see why they have denied this for her. Patient states she has ended up in the Hospital and ICU over the weekend and is currently hospitalized today for her sugars being so high. Patient states she will be discussing with a dietician about a short acting insulin but would like to get back on the [facility] and wants the nurse to re-submit her prior authorization. Please advise and follow back up with the patient at phone # [phone].

## 2023-07-02 ENCOUNTER — Ambulatory Visit: Payer: Self-pay | Admitting: *Deleted

## 2023-07-02 ENCOUNTER — Encounter: Payer: Self-pay | Admitting: Pediatrics

## 2023-07-02 ENCOUNTER — Encounter: Payer: Self-pay | Admitting: Physician Assistant

## 2023-07-02 ENCOUNTER — Telehealth (INDEPENDENT_AMBULATORY_CARE_PROVIDER_SITE_OTHER): Payer: BC Managed Care – PPO | Admitting: Physician Assistant

## 2023-07-02 ENCOUNTER — Ambulatory Visit (INDEPENDENT_AMBULATORY_CARE_PROVIDER_SITE_OTHER): Payer: BC Managed Care – PPO | Admitting: Pediatrics

## 2023-07-02 VITALS — BP 105/71 | HR 80 | Temp 98.7°F | Resp 16 | Wt 123.0 lb

## 2023-07-02 DIAGNOSIS — R350 Frequency of micturition: Secondary | ICD-10-CM | POA: Diagnosis not present

## 2023-07-02 DIAGNOSIS — G2581 Restless legs syndrome: Secondary | ICD-10-CM | POA: Diagnosis not present

## 2023-07-02 DIAGNOSIS — F319 Bipolar disorder, unspecified: Secondary | ICD-10-CM

## 2023-07-02 DIAGNOSIS — Z794 Long term (current) use of insulin: Secondary | ICD-10-CM | POA: Diagnosis not present

## 2023-07-02 DIAGNOSIS — G4700 Insomnia, unspecified: Secondary | ICD-10-CM

## 2023-07-02 DIAGNOSIS — Z1330 Encounter for screening examination for mental health and behavioral disorders, unspecified: Secondary | ICD-10-CM | POA: Diagnosis not present

## 2023-07-02 DIAGNOSIS — R739 Hyperglycemia, unspecified: Secondary | ICD-10-CM | POA: Diagnosis not present

## 2023-07-02 DIAGNOSIS — F1320 Sedative, hypnotic or anxiolytic dependence, uncomplicated: Secondary | ICD-10-CM

## 2023-07-02 DIAGNOSIS — E1165 Type 2 diabetes mellitus with hyperglycemia: Secondary | ICD-10-CM | POA: Diagnosis not present

## 2023-07-02 DIAGNOSIS — F411 Generalized anxiety disorder: Secondary | ICD-10-CM

## 2023-07-02 LAB — URINALYSIS, ROUTINE W REFLEX MICROSCOPIC
Bilirubin, UA: NEGATIVE
Glucose, UA: NEGATIVE
Ketones, UA: NEGATIVE
Nitrite, UA: NEGATIVE
Protein,UA: NEGATIVE
RBC, UA: NEGATIVE
Specific Gravity, UA: <1.005 — ABNORMAL LOW (ref 1.005–1.030)
Urobilinogen, Ur: 0.2 mg/dL (ref 0.2–1.0)
pH, UA: 5.5 (ref 5.0–7.5)

## 2023-07-02 LAB — MICROSCOPIC EXAMINATION
Bacteria, UA: NONE SEEN
RBC, Urine: NONE SEEN /[HPF] (ref 0–2)

## 2023-07-02 MED ORDER — TIRZEPATIDE 5 MG/0.5ML ~~LOC~~ SOAJ: 5.0000 mg | SUBCUTANEOUS | 0 refills | Status: DC

## 2023-07-02 MED ORDER — ALPRAZOLAM 0.5 MG PO TABS: 0.5000 mg | ORAL_TABLET | Freq: Two times a day (BID) | ORAL | 1 refills | Status: DC | PRN

## 2023-07-02 MED ORDER — QUETIAPINE FUMARATE 300 MG PO TABS: 300.0000 mg | ORAL_TABLET | Freq: Every day | ORAL | 1 refills | Status: DC

## 2023-07-02 MED ORDER — SERTRALINE HCL 100 MG PO TABS: 100.0000 mg | ORAL_TABLET | Freq: Every day | ORAL | 1 refills | Status: DC

## 2023-07-02 NOTE — Progress Notes (Signed)
Office Visit  BP 105/71 (BP Location: Left Arm, Patient Position: Sitting, Cuff Size: Normal)   Pulse 80   Temp 98.7 F (37.1 C) (Oral)   Resp 16   Wt 123 lb (55.8 kg)   LMP  (LMP Unknown)   SpO2 97%   BMI 24.02 kg/m    Subjective:    Patient ID: Amy Hansen, female    DOB: 1955-11-21, 67 y.o.   MRN: [id number]  HPI: Amy Hansen is a 67 y.o. female  Chief Complaint  Patient presents with   Diabetes Management Plan    Last checked around at 2:00 and it was 340.     Discussed the use of AI scribe software for clinical note transcription with the patient, who gave verbal consent to proceed.  History of Present Illness   The patient, recently hospitalized for poorly controlled diabetes, was discharged on a new regimen of fast-acting and long-acting insulin. She reports administering 15 units of long-acting insulin and 4-6 units of fast-acting insulin based on her glucose readings. She also takes Januvia, which she reports as ineffective. Previously, she was on [facility], which she self-discontinued due to suspected side effects. However, she has continued to experience nausea, suggesting that her symptoms may be related to her elevated glucose levels. She has also experienced episodes of hypoglycemia, waking up shaking and sweating, although these episodes are infrequent compared to her hyperglycemic episodes.  The patient reports a lack of appetite and irregular eating habits, typically consuming only one small meal a day. She also reports lethargy, falling asleep frequently even while standing up, and feeling unsteady on her feet. She has a history of falls, including one that resulted in a broken humerus.  In addition to her diabetes, the patient has a history of cardiac issues, including a recent cardiac catheterization that revealed a 90% blockage, for which a stent was placed. She also has a history of urinary tract infections, requiring hospital admission for IV antibiotics  in the past.     Relevant past medical, surgical, family and social history reviewed and updated as indicated. Interim medical history since our last visit reviewed. Allergies and medications reviewed and updated.  ROS per HPI unless specifically indicated above     Objective:    BP 105/71 (BP Location: Left Arm, Patient Position: Sitting, Cuff Size: Normal)   Pulse 80   Temp 98.7 F (37.1 C) (Oral)   Resp 16   Wt 123 lb (55.8 kg)   LMP  (LMP Unknown)   SpO2 97%   BMI 24.02 kg/m   Wt Readings from Last 3 Encounters:  07/02/23 123 lb (55.8 kg)  07/01/23 110 lb 3.7 oz (50 kg)  06/28/23 112 lb 3.4 oz (50.9 kg)     Physical Exam Constitutional:      Appearance: Normal appearance.  Pulmonary:     Effort: Pulmonary effort is normal.  Musculoskeletal:        General: Normal range of motion.  Skin:    Comments: Normal skin color  Neurological:     General: No focal deficit present.     Mental Status: She is alert. Mental status is at baseline.  Psychiatric:        Mood and Affect: Mood normal.        Behavior: Behavior normal.        Thought Content: Thought content normal.         01/04/2023    2:54 PM 12/31/2022    2:13  PM 07/18/2022    8:48 AM 01/18/2022    3:24 PM 08/01/2021    9:34 AM  Depression screen PHQ 2/9  Decreased Interest 2 2 1 2  0  Down, Depressed, Hopeless 3 3 1 2  0  PHQ - 2 Score 5 5 2 4  0  Altered sleeping 3 3 3 2 1   Tired, decreased energy 2 2 3 3 3   Change in appetite 3 3 1 3 1   Feeling bad or failure about yourself  3 3 0 3 0  Trouble concentrating 3 3 3 3 3   Moving slowly or fidgety/restless 0 3 1 3 1   Suicidal thoughts 2 3 0 1 0  PHQ-9 Score 21 25 13 22 9   Difficult doing work/chores Extremely dIfficult Extremely dIfficult Somewhat difficult Extremely dIfficult Not difficult at all       12/31/2022    2:15 PM 01/18/2022    3:25 PM 08/01/2021    9:36 AM 05/06/2020    1:26 PM  GAD 7 : Generalized Anxiety Score  Nervous, Anxious, on Edge 3 3 3  3   Control/stop worrying 3 2 3 3   Worry too much - different things 3 3 3 3   Trouble relaxing 3 2 3 2   Restless 3 3 3  0  Easily annoyed or irritable 3 3 1 2   Afraid - awful might happen 3 3 1 3   Total GAD 7 Score 21 19 17 16   Anxiety Difficulty Extremely difficult Very difficult Somewhat difficult Not difficult at all       Assessment & Plan:  Assessment & Plan   Type 2 diabetes mellitus with hyperglycemia, with long-term current use of insulin (HCC) Urinary frequency Recent hospitalization with high blood glucose levels. Currently on long-acting and short-acting insulin, with inconsistent eating habits and recent hypoglycemic episodes. Januvia not effective at glycemic control. Previously controlled on Mounjaro, but self-discontinued due to perceived side effects which persisted and now likely attributable to poor glycemic control. Plan to switch back to [place] and restart mounjaro. Did not make adjustments to insulin given inconsistent eating and hypoglycemic episodes. -Discontinue Januvia. -Initiate Mounjaro, with first month's supply provided as samples while awaiting insurance approval. -Continue current insulin regimen with sliding scale dosing. -Schedule follow-up in 1 week to monitor blood glucose levels. - U/A to r/o ketones today -     Tirzepatide; Inject 5 mg into the skin once a week.  Dispense: 6 mL; Refill: 0 -     Urinalysis, Routine w reflex microscopic  Encounter for behavioral health screening As part of their intake evaluation, the patient was screened for depression, anxiety.  PHQ9 SCORE 21, GAD7 SCORE 21. Screening results positive for tested conditions. Stable from prior. Currently on treatment. Follow up with PCP.  Follow up plan: Return in about 1 week (around 07/09/2023) for DM.  Cleotilde Spadaccini Howell Pringle, MD

## 2023-07-02 NOTE — Progress Notes (Signed)
[facility]  Patient ID: Amy Hansen,  MRN: [id number]  PCP: [name], NP  Date of Evaluation: 07/02/2023  Time spent:30 minutes  Chief Complaint:  Chief Complaint   Anxiety; Follow-up; Depression   Virtual Visit via Telehealth  I connected with patient by a video enabled telemedicine application with their informed consent, and verified patient privacy and that I am speaking with the correct person using two identifiers.  I am private, in my office and the patient is at home.  I discussed the limitations, risks, security and privacy concerns of performing an evaluation and management service by video and the availability of in person appointments. I also discussed with the patient that there may be a patient responsible charge related to this service. The patient expressed understanding and agreed to proceed.   I discussed the assessment and treatment plan with the patient. The patient was provided an opportunity to ask questions and all were answered. The patient agreed with the plan and demonstrated an understanding of the instructions.   The patient was advised to call back or seek an in-person evaluation if the symptoms worsen or if the condition fails to improve as anticipated.  I provided 30 minutes of non-face-to-face time during this encounter.  HISTORY/CURRENT STATUS: For routine med check.    Feels really tired all the time. Had to go ER (see ROS) for increased glucose. Hard time enjoying things mostly d/t her physical health.  She sleeps well.  Personal hygiene is normal.  Appetite is normal and weight is stable.  She does not cry easily.  No feelings of hopelessness.  No reports of suicidal or homicidal thoughts.  Anxiety is still an issue.  She is not having panic attacks but gets so overwhelmed with things going on in her physical health plus stress at home.  Over the past 8 months or so we have decreased the Xanax and I have only given her enough  for 2 weeks at a time so there has been no overuse to my knowledge.  Patient denies increased energy with decreased need for sleep, increased talkativeness, racing thoughts, impulsivity or risky behaviors, increased spending, increased libido, grandiosity, increased irritability or anger, paranoia, or hallucinations.  Denies dizziness, syncope, seizures, numbness, tingling, tremor, tics, unsteady gait, slurred speech, confusion. Denies muscle or joint pain, stiffness, or dystonia.  Individual Medical History/ Review of Systems: Changes? :Yes   she was hospitalized on 06/28/2023-06/29/2023 with severe hyperglycemia.  Blood sugar was over 600 in the ER.  On 07/01/2023 she returned to the ER for the same issue.  Her blood sugar was 433 at that time.  She was sent home with an addition of sliding scale insulin. For full records, see chart.  Past medications for mental health diagnoses include: Trazodone, Risperdal, Zoloft, Lunesta, prazosin, Sonata, Prozac, Depakote, Lamictal, lithium, Wellbutrin, Xanax, Ambien, carbamazepine, Seroquel, Buspar caused falls and hallucinations, Gabapentin-she doesn't want to take  Allergies: Avelox [moxifloxacin hcl in nacl], Bactrim [sulfamethoxazole-trimethoprim], Ciprofloxacin, Buspar [buspirone], Neomycin-polymyxin-dexameth, Aquaphor [lanolin-petrolatum], Depakote [divalproex sodium], Imitrex [sumatriptan], and Stadol [butorphanol]  Current Medications:  Current Outpatient Medications:    aspirin EC 81 MG tablet, Take 1 tablet (81 mg) by mouth once daily. Swallow whole., Disp: , Rfl:    cephALEXin (KEFLEX) 250 MG capsule, Take 250 mg by mouth daily., Disp: , Rfl:    cholecalciferol (CHOLECALCIFEROL) 25 MCG tablet, Take 1 tablet (1,000 Units total) by mouth daily., Disp: 30 tablet, Rfl: 3   clopidogrel (PLAVIX) 75 MG tablet, Take  1 tablet (75 mg total) by mouth daily with breakfast., Disp: 30 tablet, Rfl: 3   furosemide (LASIX) 40 MG tablet, Take 0.5 tablets (20 mg  total) by mouth 2 (two) times daily., Disp: 60 tablet, Rfl: 1   insulin aspart (NOVOLOG FLEXPEN) 100 UNIT/ML FlexPen, Inject 1 Units into the skin 3 (three) times daily with meals. Based on premeal blood sugar: 120-150- 1 unit 151-200- 2 units 201-250- 3 units 251-300- 4 units 301-350- 5 units 351-400- 6 units >400, take 6 units and repeat CBG in 1 hour, Disp: 15 mL, Rfl: 11   insulin glargine (LANTUS) 100 UNIT/ML Solostar Pen, Inject 17 Units into the skin daily., Disp: 17 mL, Rfl: 11   Insulin Pen Needle (PEN NEEDLES 3/16") 31G X 5 MM MISC, 1 Units by Does not apply route as needed., Disp: 100 each, Rfl: 2   isosorbide mononitrate (IMDUR) 30 MG 24 hr tablet, Take 0.5 tablets (15 mg total) by mouth daily., Disp: 45 tablet, Rfl: 3   magnesium 30 MG tablet, Take 30 mg by mouth 2 (two) times daily., Disp: , Rfl:    metoprolol tartrate (LOPRESSOR) 25 MG tablet, Take 1 tablet (25 mg total) by mouth 2 (two) times daily., Disp: , Rfl:    nitroGLYCERIN (NITROSTAT) 0.4 MG SL tablet, Place 1 tablet (0.4 mg total) under the tongue every 5 (five) minutes as needed., Disp: 25 tablet, Rfl: 3   ondansetron (ZOFRAN-ODT) 8 MG disintegrating tablet, Take 1 tablet (8 mg total) by mouth every 8 (eight) hours as needed for nausea or vomiting., Disp: 60 tablet, Rfl: 1   pantoprazole (PROTONIX) 40 MG tablet, Take 1 tablet (40 mg total) by mouth daily., Disp: 90 tablet, Rfl: 3   potassium chloride SA (KLOR-CON M20) 20 MEQ tablet, Take 1 tablet (20 mEq total) by mouth 2 (two) times daily., Disp: 90 tablet, Rfl: 3   pramipexole (MIRAPEX) 1 MG tablet, Take 1 tablet (1 mg total) by mouth at bedtime., Disp: 90 tablet, Rfl: 1   rOPINIRole (REQUIP) 1 MG tablet, Take 1 mg by mouth 2 (two) times daily., Disp: , Rfl:    rosuvastatin (CRESTOR) 5 MG tablet, Take 1 tablet by mouth once daily, Disp: 90 tablet, Rfl: 1   sertraline (ZOLOFT) 100 MG tablet, Take 1 tablet (100 mg total) by mouth daily., Disp: 30 tablet, Rfl: 1   sitaGLIPtin  (JANUVIA) 100 MG tablet, Take 1 tablet (100 mg total) by mouth daily., Disp: 45 tablet, Rfl: 4   ALPRAZolam (XANAX) 0.5 MG tablet, Take 1 tablet (0.5 mg total) by mouth 2 (two) times daily as needed for anxiety. Must last 15 days., Disp: 30 tablet, Rfl: 1   QUEtiapine (SEROQUEL) 300 MG tablet, Take 1 tablet (300 mg total) by mouth at bedtime., Disp: 30 tablet, Rfl: 1   tirzepatide (MOUNJARO) 2.5 MG/0.5ML Pen, Inject 2.5 mg into the skin once a week. (Patient not taking: Reported on 07/02/2023), Disp: 2 mL, Rfl: 0   tirzepatide (MOUNJARO) 5 MG/0.5ML Pen, Inject 5 mg into the skin once a week. (Patient not taking: Reported on 07/02/2023), Disp: 6 mL, Rfl: 0 Medication Side Effects: none  Family Medical/ Social History: Changes?  no  MENTAL HEALTH EXAM:  There were no vitals taken for this visit.There is no height or weight on file to calculate BMI.  General Appearance: Casual and Well Groomed  Eye Contact:  Good  Speech:  Clear and Coherent and Normal Rate  Volume:  Normal  Mood:  Euthymic  Affect:  Congruent  Thought Process:  Goal Directed and Descriptions of Associations: Circumstantial  Orientation:  Full (Time, Place, and Person)  Thought Content: Logical   Suicidal Thoughts:  No  Homicidal Thoughts:  No  Memory:   at her baseline  Judgement:  Good  Insight:  Good  Psychomotor Activity:  Normal  Concentration:  Concentration: Fair and Attention Span: Good  Recall:  Good  Fund of Knowledge: Good  Language: Good  Assets:  Communication Skills Desire for Improvement Financial Resources/Insurance Housing Resilience Transportation   ADL's:  Intact  Cognition: WNL  Prognosis:  Good   Labs from above-noted ER visit and hospitalization on 06/28/2023 reviewed.  DIAGNOSES:    ICD-10-CM   1. Generalized anxiety disorder  F41.1     2. Bipolar I disorder (HCC)  F31.9     3. Restless legs syndrome (RLS)  G25.81     4. Insomnia, unspecified type  G47.00     5. Benzodiazepine  dependence (HCC)  F13.20       Receiving Psychotherapy: No   RECOMMENDATIONS:  PDMP reviewed.  Xanax filled 06/28/2023.   I provided 30 minutes of non-face-to-face time during this encounter, including time spent before and after the visit in records review, medical decision making, counseling pertinent to today's visit, and charting.   We discussed her mental health medications.  I recommend increasing the Zoloft.  I am still unsure why it was decreased when she was hospitalized a few months ago, and it is not effective at the current dose.  She would like to bump it up.  I do not advise decreasing the Xanax anymore at this time.  With her physical health and the holidays, I am sure she is more anxious.  She has not had further falls or increased confusion so leaving the dose the same is appropriate.   Continue Xanax 0.5 mg, 1 p.o. twice daily.  DON'T FILL EARLY. EVER.   Continue pramipexole 1 mg, 1 p.o. nightly. Continue Seroquel 100 mg, 3 p.o. nightly. (Also discuss w/ cardiology-any concerns of taking it s/p cardiac stent.) Continue ropinirole 1 mg, 1 p.o. twice daily. Increase Zoloft to 100 mg p.o. daily.   Strongly recommend counseling. Return in 4 to 6 weeks.  Melony Overly, PA-C

## 2023-07-02 NOTE — Progress Notes (Signed)
Noted  

## 2023-07-02 NOTE — Progress Notes (Signed)
Amy Hansen, please call [name] about the Seroquel. This morning I sent a new Rx for 300 mg, 1 at bedtime.  If she hasn't picked it up, lets send in 100 mg, #10, have her take 2 for 3 nights, then 1 for 3 nights, then stop.  If she's already picked up the 300 mg, have her take 1/2 for 3 nights, 1/4 (if she's able to cut and if not, let me know) for 3 nights, then stop.  If she has insomnia after stopping it, I may need to send in 25 mg for sleep only.  Copied to Nicole Cella and Dr. Adela Lank as Lorain Childes

## 2023-07-02 NOTE — Telephone Encounter (Signed)
Reason for Disposition  [1] Blood glucose > 300 mg/dL (40.9 mmol/L) AND [8] two or more times in a row  Answer Assessment - Initial Assessment Questions 1. BLOOD GLUCOSE: "What is your blood glucose level?"      329 this morning after I ate.   Normally it's been all over the place.   Only time it's not been high was when I was on the [facility].   I went off of it.   Clydie Braun agreed to start me back on it.  I was in ICU over Thanksgiving Holiday.  Insurance not approve [employer].   They didn't get it ready in time to pick up on Wed.   Thur. Closed for Thanksgiving.   No insulin.   It got over 600.   My meter says "high" if over 600.    I went to ED yesterday by EMS.    Yesterday reading was "high" again.   EMS came checked it it was 578.   I take a long acting insulin I take every morning 15 units and a short acting insulin that I take before I eat a meal.    I check my sugar and give insulin based on the reading.    What do I do?    I'm randomly checking my sugars during the day.   I'm not sure what to do.   If I need it to come down what I do?    If it's over 600 what do I do?      I've been so lethargic for a week.   When I sit down to read I fall asleep.   I can't concentrate.   I can't get everything done.   Urinary frequency but I'm on Lasix 80 mg a day.  I have serious swelling in my feet and ankles.   So they are doing good.    My heart dr limits my fluids to 2 liters a day.    It takes almost 2 liters to take my medicine in the mornings.   I have an appt Friday with my heart dr and with Aura Dials, NP.    I'm very dizzy this morning and having dry heaves.      2. ONSET: "When did you check the blood glucose?"     This morning it was 329.    3. USUAL RANGE: "What is your glucose level usually?" (e.g., usual fasting morning value, usual evening value)     It's been all over the place lately.   I'm limited on how much fluids I can take in.   Only 2 liters a day per my cardiologist.  I know drinking  water helps to bring my glucose down but I don't know what to do. 4. KETONES: "Do you check for ketones (urine or blood test strips)?" If Yes, ask: "What does the test show now?"      Not asked 5. TYPE 1 or 2:  "Do you know what type of diabetes you have?"  (e.g., Type 1, Type 2, Gestational; doesn't know)      Type 2 6. INSULIN: "Do you take insulin?" "What type of insulin(s) do you use? What is the mode of delivery? (syringe, pen; injection or pump)?"      Yes    See above 7. DIABETES PILLS: "Do you take any pills for your diabetes?" If Yes, ask: "Have you missed taking any pills recently?"     Januvia 8. OTHER SYMPTOMS: "Do you have  any symptoms?" (e.g., fever, frequent urination, difficulty breathing, dizziness, weakness, vomiting)     See above 9. PREGNANCY: "Is there any chance you are pregnant?" "When was your last menstrual period?"     N/A due to age  Protocols used: Diabetes - High Blood Sugar-A-AH

## 2023-07-02 NOTE — Telephone Encounter (Addendum)
  Chief Complaint: This morning it's 329 after eating.   Glucose meter reading "High" which means it's over 600.   Pt. Was in ICU over Thanksgiving due to very elevated glucose.   Suppose to be on [place] but insurance didn't approve it in time for it to be ready on Wed. Before Thanksgiving.   Of course the pharmacy was closed Thanksgiving and I was without insulin or anything.  My glucose got very elevated so I had to call 911 to take me to the ED and I was admitted.   I'm limited to 2 liters of fluid a day per my cardiologist.   I'm needing advice on what to do to stay out of the ED.    I'm seeking advice from [name] NP.   I don't want to end up back in the ED.  (See triage notes for details) Symptoms: Dizziness, glucose readings "high" meaning over 600 on meter, urinating frequently but on Lasix 80 mg daily due to swollen legs and ankles which are fine today.  Very very lethargic.   I sit down at the couch and fall asleep.   "I can't get anything done".   I'm having nausea and dry heaves.   No vomiting. Frequency: Glucose elevated this morning.   Seeking advice Pertinent Negatives: Patient denies having the Mounjaro.   My sugars did well when I was on it before. Disposition: [] ED /[] Urgent Care (no appt availability in office) / [] Appointment(In office/virtual)/ []  [facility]/ [] Home Care/ [] Refused Recommended Disposition /[] [facility]/ [x]  Follow-up with PCP Additional Notes: High priority message sent to [name] NP.   Pt. Seeking advice so she doesn't end up in the ED again.    I called into the practice to be sure my notes had gone through and they did so Jolene could be made aware of pt's situation.

## 2023-07-02 NOTE — Patient Instructions (Addendum)
Plan to restart mounjaro 2.5mg  for for weeks (once a week) Stop Januvia

## 2023-07-03 DIAGNOSIS — M25612 Stiffness of left shoulder, not elsewhere classified: Secondary | ICD-10-CM | POA: Diagnosis not present

## 2023-07-03 DIAGNOSIS — M25512 Pain in left shoulder: Secondary | ICD-10-CM | POA: Diagnosis not present

## 2023-07-03 NOTE — Telephone Encounter (Signed)
Remains in process with insurance for redetermination. Patient was seen in office on 07/02/2023 by Dr. Evelene Croon.

## 2023-07-03 NOTE — Progress Notes (Signed)
Cardiology Office Note    Date:  07/05/2023   ID:  Amy Hansen, DOB 12-Apr-1956, MRN [id number]  PCP:  Amy Grooms, NP  Cardiologist:  Amy Kendall, MD  Electrophysiologist:  None   Chief Complaint: Follow-up  History of Present Illness:   Amy Hansen is a 67 y.o. female with history of CAD status post recent PCI/DES to OM1 in 04/2023, HFpEF, PSVT, CVA/TIA, DM2, anemia, indeterminate colitis, elevated LFTs, RLS, hiatal hernia, anxiety, depression, and GERD who presents for follow-up of CAD and HFpEF.  She was previously followed by Dr. Alvino Hansen with echo in 07/2016 showing an EF of 55 to 60%, no regional wall motion abnormalities, grade 1 diastolic dysfunction, mild mitral regurgitation, and a mildly dilated left atrium.  Treadmill MPI in 08/2016 showed no significant ischemia with adequate exercise tolerance, and was overall low risk.  Zio patch in 07/2019 showed a predominant rhythm of sinus with an average rate of 101 bpm, a single episode of NSVT lasting 9 beats, 20 episodes of SVT lasting up to 11 beats, and rare PACs/PVCs.  Patient triggered events corresponded to sinus rhythm with PACs.  Echo from 03/2021 demonstrated an EF of 60 to 65%, no regional wall motion abnormalities, grade 1 diastolic dysfunction, normal RV systolic function and ventricular cavity size, mild aortic valve sclerosis without evidence of stenosis, and an estimated right atrial pressure of 3 mmHg.      Prior MRI of the brain in 2022 demonstrated small chronic cortical/subcortical infarct within the left parietal lobe as well as a small chronic lacunar infarcts within the bilateral basal ganglia and changes consistent with chronic small vessel ischemia.     She was seen in the office in 05/2022 and was without symptoms of angina or decompensation.  She did report a 1 week history of exertional shortness of breath when going up or down a flight of stairs.  She also reported an episode during the summer where she  woke up on the floor after a lamp fell on her.  At that time, CT of the head and MRI of the face showed no acute process.  It was unclear if this was associated with a syncopal episode or not.  There was no cardiac pro or postdrome.  Zio patch in 05/2022 showed a predominant rhythm of sinus with an average rate of 103 bpm (range 74 to 150 bpm in sinus), single episode of NSVT occurred lasting 4 beats with a maximum rate of 222 bpm, 11 episodes of SVT lasting up to 7 beats with a maximum rate of 148 bpm, rare PACs and PVCs, and no sustained arrhythmias or prolonged pauses.  Patient triggered events corresponded to sinus rhythm and blocked PAC.   She was seen at outside ED on 07/16/2022 with intermittent confusion and difficulty saying specific words.  CT of the head showed no acute intracranial abnormality.  High-sensitivity troponin negative.  She followed up with PCP and was found to have UTI.  Subsequent MRI of the brain in 09/2022 showed no evidence of acute intracranial abnormality with redemonstrated tiny chronic cortical infarct within the left parietal lobe, redemonstrated tiny chronic lacunar infarct within the right thalamus, and similar appearance of likely chronic small vessel ischemia.   Echo on 07/24/2022 demonstrated an EF of 50 to 55%, no regional wall motion abnormalities, mild LVH, grade 1 diastolic dysfunction, normal RV systolic function and ventricular cavity size, mildly dilated left atrium, and trivial mitral regurgitation.   She was seen in the  office in 07/2022 and continued to report episodes of transient confusion associated with dysarthria without weakness.  CTA head and neck in 11/2022 showed no acute intracranial finding with chronic small vessel ischemic changes.  No intracranial large vessel occlusion or proximal stenosis.  No suspected significant carotid artery stenosis with aortic atherosclerosis noted.   She was admitted to the hospital in 01/2023 with flank pain found to have  pan positive review of systems.  She was treated for AKI presumed to be secondary to dehydration in the setting of dysphagia with nausea and vomiting.  Found to have large amount of stool burden on CT which was likely contributing to her nausea and vomiting.  GI was consulted with very low suspicion for underlying organic GI disorder with symptoms felt to be related to medication side effects and anxiety.  From a CHF perspective, she appeared compensated.  High-sensitivity troponin trended to 51.   She was seen in the office in 02/2023 noting increase in lower extremity edema despite taking 40 mg of furosemide for several weeks.  Her weight was down 22 pounds by our scale when compared to her visit in 07/2022.  She was advised to take furosemide 20 mg twice daily.  Echo on 04/10/2023 showed an EF of 60 to 65%, no regional wall motion abnormalities, grade 1 diastolic dysfunction, normal RV systolic function and ventricular cavity size, mild to moderate mitral regurgitation, moderate mitral annular calcification, mild to moderate tricuspid regurgitation, and an estimated right atrial pressure of 3 mmHg.   She was seen in the office on 04/11/2023 noting intermittent episodes of pain under the left breast that radiated to the left arm.  She was without symptoms of dyspnea and continue to note waxing and waning edema in her lower extremities.  Her weight has been relatively stable.  In the setting of palpitations noted off metoprolol, this was resumed.  Subsequent coronary CTA on 05/02/2023 showed a calcium score of 29.3 which was the 68th percentile.  There was less than 25% LCx and RCA stenosis with severe stenosis estimated at greater than 70% of proximal OM1.  ctFFR of 0.77 in the ostial OM1 with normal ctFFR of the LAD and RCA.  LHC on 05/15/2023 showed severe single-vessel CAD with 90% stenosis of proximal OM1.  There was mild to moderate disease involving the LAD and RCA as outlined below.  Sluggish flow in the  distal LAD on initial images resolved with intracoronary nitroglycerin suggesting some element of coronary vasospasm.  Normal LVEF estimated at 55 to 65% with upper normal filling pressure with an LVEDP of 15 mmHg.  She underwent successful PCI/DES to OM1.  Following cardiac cath that she was started on Imdur 15 mg daily in the setting of 2 episodes of chest discomfort that prompted her to take SL NTG.  Previously noted ankle swelling had improved from prior visits.  She was last seen in our office on 06/19/2023 noting ongoing lower extremity swelling and weight gain of 5 pounds by our scale when compared to her visit earlier in the month.  She had recently been started on pregabalin by neurology.  She was taking furosemide 20 mg twice daily.  She continued to note sporadic chest pain that would last only few seconds in duration that was felt to be unlikely related to coronary insufficiency.  She seemed to be tolerating Imdur and was overall feeling better since undergoing PCI to OM1 in 04/2023.  She was volume overloaded with recommendation to increase furosemide to  40 mg twice daily and replete potassium.  She was admitted to the hospital in late 05/2023 with hyperosmolar nonketotic hyperglycemia with blood glucose reading greater than 600 and treated with insulin drip.  She was seen in the ED on 07/01/2023 with continued hyperglycemia with a glucose of 433, treated with IV fluids and addition of short acting sliding scale insulin.  Management of her diabetes has been challenging with irregular eating habits and with prior self discontinuation of Mounjaro.  Renal function remained stable throughout the above hospital admission and ED visit.  She comes in doing well from a cardiac perspective and is without symptoms of angina or cardiac decompensation.  Since her last visit she notes resolution of lower extremity swelling.  She has also discontinued pregabalin.  Her weight is now 5 pounds today when compared  to her last clinic visit.  No palpitations, dizziness, presyncope, or syncope.  She notes daytime somnolence and easily napping.  She also self-reports having fell asleep while standing up or reading.  No prior evaluation for sleep disturbances.  She continues to report difficult to control blood sugars.  No falls, hematochezia, or melena.  Adherent to DAPT.  Overall feels like she is doing well from a cardiac perspective.   Labs independently reviewed: 06/2023 - potassium 4.1, BUN 18, serum creatinine 0.8, albumin 3.2, AST 47, ALT normal, Hgb 11.3, PLT 331 05/2023 - TC 92, TG 96, HDL 47, LDL 27 04/2023 - A1c 7.1, LP(a) 31.8 11/2022 - TSH normal   Past Medical History:  Diagnosis Date   Anemia    Anxiety    Arthritis    Blood transfusion without reported diagnosis    Cataract    CHF (congestive heart failure) (HCC)    Chronic kidney disease    UTI, hematuria in urine   Colitis    Crohn's disease (HCC)    Depression    Diabetes (HCC)    Diverticulosis    Frequent headaches    Interstitial cystitis    Recurrent UTI    Restless leg syndrome    TIA (transient ischemic attack) 02/20/2021   Urinary frequency     Past Surgical History:  Procedure Laterality Date   bariatric bypass  2012   BIOPSY  05/03/2020   Procedure: BIOPSY;  Surgeon: Rachael Fee, MD;  Location: [facility] ENDOSCOPY;  Service: Endoscopy;;   CARPAL TUNNEL RELEASE Right 2003   CARPAL TUNNEL RELEASE Right    2008   CHOLECYSTECTOMY  1975   COLONOSCOPY     COLONOSCOPY WITH PROPOFOL N/A 05/03/2020   Procedure: COLONOSCOPY WITH PROPOFOL;  Surgeon: Rachael Fee, MD;  Location: [facility] ENDOSCOPY;  Service: Endoscopy;  Laterality: N/A;   CORONARY STENT INTERVENTION N/A 05/15/2023   Procedure: CORONARY STENT INTERVENTION;  Surgeon: Amy Kendall, MD;  Location: [facility] INVASIVE CV LAB;  Service: Cardiovascular;  Laterality: N/A;   CYSTOSCOPY W/ RETROGRADES Bilateral 06/06/2015   Procedure: CYSTOSCOPY WITH RETROGRADE  PYELOGRAM;  Surgeon: Jerilee Field, MD;  Location: [facility] ORS;  Service: Urology;  Laterality: Bilateral;   EYE SURGERY Left 07/06/2022   FL INJ LEFT KNEE CT ARTHROGRAM ([facility] HX) Left    1995   GASTRIC BYPASS  2010   HAND SURGERY Left 01/19/2021   Thumb   HEMORRHOID SURGERY  2013   KNEE ARTHROSCOPY Left 1996   LEFT HEART CATH AND CORONARY ANGIOGRAPHY Left 05/15/2023   Procedure: LEFT HEART CATH AND CORONARY ANGIOGRAPHY;  Surgeon: Amy Kendall, MD;  Location: [facility] INVASIVE CV LAB;  Service: Cardiovascular;  Laterality: Left;   TONSILLECTOMY     TOTAL ABDOMINAL HYSTERECTOMY W/ BILATERAL SALPINGOOPHORECTOMY      Current Medications: Current Meds  Medication Sig   ALPRAZolam (XANAX) 0.5 MG tablet Take 1 tablet (0.5 mg total) by mouth 2 (two) times daily as needed for anxiety. Must last 15 days.   aspirin EC 81 MG tablet Take 1 tablet (81 mg) by mouth once daily. Swallow whole.   cephALEXin (KEFLEX) 250 MG capsule Take 250 mg by mouth daily.   cholecalciferol (CHOLECALCIFEROL) 25 MCG tablet Take 1 tablet (1,000 Units total) by mouth daily.   clopidogrel (PLAVIX) 75 MG tablet Take 1 tablet (75 mg total) by mouth daily with breakfast.   insulin aspart (NOVOLOG FLEXPEN) 100 UNIT/ML FlexPen Inject 1 Units into the skin 3 (three) times daily with meals. Based on premeal blood sugar: 120-150- 1 unit 151-200- 2 units 201-250- 3 units 251-300- 4 units 301-350- 5 units 351-400- 6 units >400, take 6 units and repeat CBG in 1 hour   insulin glargine (LANTUS) 100 UNIT/ML Solostar Pen Inject 17 Units into the skin daily.   Insulin Pen Needle (PEN NEEDLES 3/16") 31G X 5 MM MISC 1 Units by Does not apply route as needed.   isosorbide mononitrate (IMDUR) 30 MG 24 hr tablet Take 0.5 tablets (15 mg total) by mouth daily.   magnesium 30 MG tablet Take 30 mg by mouth 2 (two) times daily.   metoprolol tartrate (LOPRESSOR) 25 MG tablet Take 1 tablet (25 mg total) by mouth 2 (two) times daily.   nitroGLYCERIN  (NITROSTAT) 0.4 MG SL tablet Place 1 tablet (0.4 mg total) under the tongue every 5 (five) minutes as needed.   ondansetron (ZOFRAN-ODT) 8 MG disintegrating tablet Take 1 tablet (8 mg total) by mouth every 8 (eight) hours as needed for nausea or vomiting.   pantoprazole (PROTONIX) 40 MG tablet Take 1 tablet (40 mg total) by mouth daily.   potassium chloride SA (KLOR-CON M20) 20 MEQ tablet Take 1 tablet (20 mEq total) by mouth 2 (two) times daily.   pramipexole (MIRAPEX) 1 MG tablet Take 1 tablet (1 mg total) by mouth at bedtime.   rOPINIRole (REQUIP) 1 MG tablet Take 1 mg by mouth 2 (two) times daily.   rosuvastatin (CRESTOR) 5 MG tablet Take 1 tablet by mouth once daily   sertraline (ZOLOFT) 100 MG tablet Take 1 tablet (100 mg total) by mouth daily.   tirzepatide [facility]) 2.5 MG/0.5ML Pen Inject 2.5 mg into the skin once a week.   tirzepatide [facility]) 5 MG/0.5ML Pen Inject 5 mg into the skin once a week.   [DISCONTINUED] furosemide (LASIX) 40 MG tablet Take 0.5 tablets (20 mg total) by mouth 2 (two) times daily. (Patient taking differently: Take 40 mg by mouth 2 (two) times daily.)    Allergies:   Avelox [moxifloxacin hcl in nacl], Bactrim [sulfamethoxazole-trimethoprim], Ciprofloxacin, Buspar [buspirone], Neomycin-polymyxin-dexameth, Aquaphor [lanolin-petrolatum], Depakote [divalproex sodium], Imitrex [sumatriptan], and Stadol [butorphanol]   Social History   Socioeconomic History   Marital status: Married    Spouse name: Not on file   Number of children: Not on file   Years of education: Not on file   Highest education level: Not on file  Occupational History   Not on file  Tobacco Use   Smoking status: Former    Current packs/day: 0.00    Types: Cigarettes    Quit date: 04/25/1975    Years since quitting: 48.2   Smokeless tobacco: Never   Tobacco  comments:    quit 40 years ago  Vaping Use   Vaping status: Never Used  Substance and Sexual Activity   Alcohol use: No     Alcohol/week: 0.0 standard drinks of alcohol   Drug use: No   Sexual activity: Not Currently    Birth control/protection: Post-menopausal, Surgical  Other Topics Concern   Not on file  Social History Narrative   Caffeine 5 servings per day.   Social Determinants of Health   Financial Resource Strain: Medium Risk (04/12/2021)   Overall Financial Resource Strain (CARDIA)    Difficulty of Paying Living Expenses: Somewhat hard  Food Insecurity: No Food Insecurity (05/15/2023)   Hunger Vital Sign    Worried About Running Out of Food in the Last Year: Never true    Ran Out of Food in the Last Year: Never true  Transportation Needs: No Transportation Needs (05/15/2023)   PRAPARE - Administrator, Civil Service (Medical): No    Lack of Transportation (Non-Medical): No  Physical Activity: Inactive (04/12/2021)   Exercise Vital Sign    Days of Exercise per Week: 0 days    Minutes of Exercise per Session: 0 min  Stress: No Stress Concern Present (03/06/2022)   [employer] of Occupational Health - Occupational Stress Questionnaire    Feeling of Stress : Only a little  Social Connections: Moderately Integrated (03/06/2022)   Social Connection and Isolation Panel [NHANES]    Frequency of Communication with Friends and Family: More than three times a week    Frequency of Social Gatherings with Friends and Family: More than three times a week    Attends Religious Services: More than 4 times per year    Active Member of [employer] or Organizations: No    Attends Banker Meetings: Never    Marital Status: Married     Family History:  The patient's family history includes Colon cancer in her mother; Heart failure in her sister; Stroke in her father. There is no history of Bladder Cancer, Kidney disease, Prostate cancer, Kidney cancer, Pancreatic cancer, Esophageal cancer, Stomach cancer, Rectal cancer, or Breast cancer.  ROS:   12-point review of systems is negative unless  otherwise noted in the HPI.   EKGs/Labs/Other Studies Reviewed:    Studies reviewed were summarized above. The additional studies were reviewed today:  LHC 05/15/2023: Conclusions: Severe single-vessel coronary artery disease with 90% stenosis of proximal OM1.  There is mild-moderate disease involving the LAD and RCA.  Sluggish flow in the distal LAD on initial images resolved with intracoronary nitroglycerin suggesting some element of coronary vasospasm. Normal left ventricular systolic function (LVEF 55-65%) with upper normal filling pressure (LVEDP 15 mmHg). Successful PCI to OM1 using Onyx Frontier 2.25 x 18 mm drug-eluting stent (postdilated to 2.6 mm) with 0% residual stenosis and TIMI-3 flow. Small right radial artery necessitating use of 58F sheath/catheters.  Consider alternative access for future catheterizations, particularly if larger catheters are necessary.   Recommendations: Overnight observation. Dual antiplatelet therapy with aspirin and clopidogrel for at least 6 months. Aggressive secondary prevention of coronary artery disease.  Will continue low-dose rosuvastatin given very low LDL and concern for myopathy with higher intensity statin therapy. __________   Coronary 05/02/2023: FINDINGS: Aorta: Normal size. Mild aortic wall calcifications. No dissection.   Aortic Valve:  Trileaflet.  No calcifications.   Coronary Arteries:  Normal coronary origin.  Right dominance.   RCA is a dominant artery. There is calcified plaque proximally causing minimal stenosis (<  25%).   Left main gives rise to LAD and LCX arteries. LM has no disease.   LAD has no plaque.   LCX is a non-dominant artery. Minimal proximal LCx stenosis (<25%). There is non calcified plaque in the proximal OM1 causing severe stenosis (>70%).   Other findings:   Normal pulmonary vein drainage into the left atrium.   Normal left atrial appendage without a thrombus.   Normal size of the pulmonary  artery.   IMPRESSION: 1. Coronary calcium score of 29.3. This was 68th percentile for age and sex matched control. 2. Normal coronary origin with right dominance. 3. Severe stenosis of proximal OM1 branch (>70%). 4. Minimal RCA and LCx stenosis (<25%) 5. CAD-RADS 4 Severe stenosis. (70-99% or > 50% left main). Cardiac catheterization is recommended. Consider symptom-guided anti-ischemic pharmacotherapy as well as risk factor modification per guideline directed care. 6. Additional analysis with CT FFR will be submitted and reported separately.     crFFR: 1. Left Main:  No significant stenosis.   2. LAD: No significant stenosis.  FFRct 0.95 3. LCX: significant stenosis in ostial OM1. FFRct 0.77 4. RCA: No significant stenosis.  FFRct 0.92   IMPRESSION: 1. CT FFR analysis showed significant stenosis in the proximal first obtuse marginal branch (OM1). FFRct 0.77 2.  Cardiac catheterization recommended. __________   2D echo 04/10/2023: 1. Left ventricular ejection fraction, by estimation, is 60 to 65%. The  left ventricle has normal function. The left ventricle has no regional  wall motion abnormalities. Left ventricular diastolic parameters are  consistent with Grade I diastolic  dysfunction (impaired relaxation).   2. Right ventricular systolic function is normal. The right ventricular  size is normal. Tricuspid regurgitation signal is inadequate for assessing  PA pressure.   3. The mitral valve is normal in structure. Mild to moderate mitral valve  regurgitation. No evidence of mitral stenosis. Moderate mitral annular  calcification.   4. Tricuspid valve regurgitation is mild to moderate.   5. The aortic valve is tricuspid. Aortic valve regurgitation is not  visualized. No aortic stenosis is present.   6. The inferior vena cava is normal in size with greater than 50%  respiratory variability, suggesting right atrial pressure of 3 mmHg.  __________   2D echo  07/24/2022: 1. Left ventricular ejection fraction, by estimation, is 50 to 55%. Left  ventricular ejection fraction by 2D MOD biplane is 53.8 %. The left  ventricle has low normal function. The left ventricle has no regional wall  motion abnormalities. There is mild left ventricular hypertrophy. Left ventricular diastolic parameters are consistent with Grade I diastolic dysfunction (impaired relaxation). The average left ventricular global longitudinal strain is -16.8 %.   2. Right ventricular systolic function is normal. The right ventricular size is normal.   3. Left atrial size was mildly dilated.   4. The mitral valve is normal in structure. Trivial mitral valve regurgitation.   5. The aortic valve is tricuspid. Aortic valve regurgitation is not visualized. __________    Luci Bank patch 05/2022:   The patient was monitored for 13 days, 23 hours.   The predominant rhythm was sinus with an average rate of 103 bpm (range 74-150 bpm in sinus).   There were rare PAC's and PVC's.   A single episode of nonsustained ventricular tachycardia occurred, lasting 4 beats with a maximum rate of 222 bpm.   There were 11 supraventricular runs, lasting up to 7 beats with a maximum rate of 148 bpm.   No sustained  arrhythmia or prolonged pause was observed.   Patient triggered events correspond to sinus rhythm and blocked PAC.   Predominantly sinus rhythm with elevated average heart rate.  Rare PAC's and PVC's were observed, as well as a few brief episodes of NSVT and PSVT, as detailed above. __________   2D echo 04/20/2021: 1. Left ventricular ejection fraction, by estimation, is 60 to 65%. Left  ventricular ejection fraction by 2D MOD biplane is 64.3 %. The left  ventricle has normal function. The left ventricle has no regional wall  motion abnormalities. Left ventricular  diastolic parameters are consistent with Grade I diastolic dysfunction  (impaired relaxation).   2. Right ventricular systolic function  is normal. The right ventricular  size is normal.   3. The mitral valve is grossly normal. No evidence of mitral valve  regurgitation.   4. The aortic valve is tricuspid. Aortic valve regurgitation is not  visualized. Mild aortic valve sclerosis is present, with no evidence of  aortic valve stenosis.   5. The inferior vena cava is normal in size with greater than 50%  respiratory variability, suggesting right atrial pressure of 3 mmHg.   Comparison(s): LVEF 55-60%.  __________   Luci Bank patch 07/2019: The patient was monitored for 12 days, 14 hours. The predominant rhythm was sinus with an average rate of 101 bpm (range 66 to 150 bpm in sinus). Rare PACs and PVCs were noted. A single episode of nonsustained ventricular tachycardia lasting nine beats occurred, with a maximum rate of 152 bpm. There were 20 episodes of supraventricular tachycardia lasting up to 11 beats with a maximal rate of 176 bpm. No sustained arrhythmia or prolonged pause was observed. Patient triggered events correspond to sinus rhythm with PACs.   Predominantly sinus rhythm with rare PACs and PVCs as well as brief PSVT and NSVT.  Patient triggered events correspond to sinus rhythm with PACs. __________   Treadmill MPI 08/30/2016: Exercise  myocardial perfusion imaging study with no significant  ischemia Normal wall motion, EF estimated at 50% No EKG changes concerning for ischemia at peak stress or in recovery. Target heart rate achieved Adequate exercise tolerance, exercised for 5:50 min Low risk scan __________   2D echo 08/23/2016: - Left ventricle: The cavity size was normal. Wall thickness was    normal. Systolic function was normal. The estimated ejection    fraction was in the range of 55% to 60%. Wall motion was normal;    there were no regional wall motion abnormalities. Doppler    parameters are consistent with abnormal left ventricular    relaxation (grade 1 diastolic dysfunction).  - Mitral valve:  There was mild regurgitation.  - Left atrium: The atrium was mildly dilated.    EKG:  EKG is not ordered today.    Recent Labs: 02/10/2023: Magnesium 2.1 07/01/2023: ALT 43 07/05/2023: BUN 16; Creatinine, Ser 0.73; Hemoglobin 12.2; Platelets 338; Potassium 4.2; Sodium 137  Recent Lipid Panel    Component Value Date/Time   CHOL 92 (L) 06/18/2023 1337   CHOL 139 07/17/2016 1408   CHOL 148 01/10/2012 0717   TRIG 96 06/18/2023 1337   TRIG 99 07/17/2016 1408   TRIG 110 01/10/2012 0717   HDL 47 06/18/2023 1337   HDL 47 01/10/2012 0717   CHOLHDL 2.0 06/18/2023 1337   CHOLHDL 2.0 04/26/2020 0339   VLDL 13 04/26/2020 0339   VLDL 20 07/17/2016 1408   VLDL 22 01/10/2012 0717   LDLCALC 27 06/18/2023 1337   LDLCALC 79 01/10/2012  1324    PHYSICAL EXAM:    VS:  BP 120/70 (BP Location: Left Arm, Patient Position: Sitting, Cuff Size: Normal)   Pulse 93   Ht 5' (1.524 m)   Wt 115 lb 9.6 oz (52.4 kg)   LMP  (LMP Unknown)   SpO2 97%   BMI 22.58 kg/m   BMI: Body mass index is 22.58 kg/m.  Physical Exam Vitals reviewed.  Constitutional:      Appearance: She is well-developed.  HENT:     Head: Normocephalic and atraumatic.  Eyes:     General:        Right eye: No discharge.        Left eye: No discharge.  Neck:     Vascular: No JVD.  Cardiovascular:     Rate and Rhythm: Normal rate and regular rhythm.     Pulses:          Posterior tibial pulses are 2+ on the right side and 2+ on the left side.     Heart sounds: Normal heart sounds, S1 normal and S2 normal. Heart sounds not distant. No midsystolic click and no opening snap. No murmur heard.    No friction rub.  Pulmonary:     Effort: Pulmonary effort is normal. No respiratory distress.     Breath sounds: Normal breath sounds. No decreased breath sounds, wheezing, rhonchi or rales.  Chest:     Chest wall: No tenderness.  Abdominal:     General: There is no distension.     Palpations: Abdomen is soft.     Tenderness: There is  no abdominal tenderness.  Musculoskeletal:     Cervical back: Normal range of motion.     Right lower leg: No edema.     Left lower leg: No edema.  Skin:    General: Skin is warm and dry.     Nails: There is no clubbing.  Neurological:     Mental Status: She is alert and oriented to person, place, and time.  Psychiatric:        Speech: Speech normal.        Behavior: Behavior normal.        Thought Content: Thought content normal.        Judgment: Judgment normal.     Wt Readings from Last 3 Encounters:  07/05/23 115 lb 9.6 oz (52.4 kg)  07/02/23 123 lb (55.8 kg)  07/01/23 110 lb 3.7 oz (50 kg)     ASSESSMENT & PLAN:   HFpEF: Volume status is much improved.  She appears euvolemic and well compensated.  With significant improvement in lower extremity swelling we will reduce furosemide back to 20 mg twice daily.  Defer escalation of GDMT at this time with SGLT2 inhibitor or MRA in the setting of multiple medication intolerances.  This can be revisited in follow-up.  CAD involving the native coronary arteries without angina: She is doing well and without symptoms concerning for angina.  Continue aggressive risk factor modification and secondary prevention including aspirin and clopidogrel for a minimum of 6 months dating back to date of PCI (05/15/2023).  She will otherwise continue Imdur 15 mg Lopressor 25 mg twice daily, and rosuvastatin 5 mg.  No indication for further ischemic testing at this time.  Palpitations with PSVT: Quiescent on Lopressor as outlined above.  History of CVA: No new deficits.  Remains on aspirin and statin therapy.  Elevated LFTs: Improving on most recent check earlier this month.  Previously referred to  hepatology for further evaluation.  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  Sleep disturbance: Query if symptoms are related to untreated OSA versus narcolepsy.  She is encouraged to follow-up  with neurology.     Disposition: F/u with Dr. Okey Dupre or an APP in 1 month.   Medication Adjustments/Labs and Tests Ordered: Current medicines are reviewed at length with the patient today.  Concerns regarding medicines are outlined above. Medication changes, Labs and Tests ordered today are summarized above and listed in the Patient Instructions accessible in Encounters.   Signed, Eula Listen, PA-C 07/05/2023 12:48 PM     [facility] [place] [address] [place], [place] [address] [phone]

## 2023-07-04 ENCOUNTER — Telehealth: Payer: Self-pay | Admitting: Physician Assistant

## 2023-07-04 NOTE — Telephone Encounter (Signed)
Patient was approved for [facility] prescription.

## 2023-07-04 NOTE — Telephone Encounter (Signed)
Llana reported she discontinued her Seroquel due to her liver enzymes numbers. She is experiencing anxiety, and lack of sleep. She is requesting an alternative for sleep. Please advise.

## 2023-07-05 ENCOUNTER — Encounter: Payer: Self-pay | Admitting: Physician Assistant

## 2023-07-05 ENCOUNTER — Ambulatory Visit: Payer: BC Managed Care – PPO | Attending: Physician Assistant | Admitting: Physician Assistant

## 2023-07-05 ENCOUNTER — Other Ambulatory Visit
Admission: RE | Admit: 2023-07-05 | Discharge: 2023-07-05 | Disposition: A | Discharge status: Home / Self Care | Payer: BC Managed Care – PPO | Source: Ambulatory Visit | Attending: Physician Assistant | Admitting: Physician Assistant

## 2023-07-05 ENCOUNTER — Ambulatory Visit: Payer: BC Managed Care – PPO | Admitting: Nurse Practitioner

## 2023-07-05 VITALS — BP 120/70 | HR 93 | Ht 60.0 in | Wt 115.6 lb

## 2023-07-05 DIAGNOSIS — I25810 Atherosclerosis of coronary artery bypass graft(s) without angina pectoris: Secondary | ICD-10-CM | POA: Diagnosis not present

## 2023-07-05 DIAGNOSIS — Z794 Long term (current) use of insulin: Secondary | ICD-10-CM

## 2023-07-05 DIAGNOSIS — E1165 Type 2 diabetes mellitus with hyperglycemia: Secondary | ICD-10-CM

## 2023-07-05 DIAGNOSIS — Z79899 Other long term (current) drug therapy: Secondary | ICD-10-CM | POA: Insufficient documentation

## 2023-07-05 DIAGNOSIS — I5032 Chronic diastolic (congestive) heart failure: Secondary | ICD-10-CM

## 2023-07-05 DIAGNOSIS — Z8673 Personal history of transient ischemic attack (TIA), and cerebral infarction without residual deficits: Secondary | ICD-10-CM

## 2023-07-05 DIAGNOSIS — G479 Sleep disorder, unspecified: Secondary | ICD-10-CM

## 2023-07-05 DIAGNOSIS — I471 Supraventricular tachycardia, unspecified: Secondary | ICD-10-CM | POA: Diagnosis not present

## 2023-07-05 DIAGNOSIS — R7989 Other specified abnormal findings of blood chemistry: Secondary | ICD-10-CM

## 2023-07-05 LAB — CBC
HCT: 38.1 % (ref 36.0–46.0)
Hemoglobin: 12.2 g/dL (ref 12.0–15.0)
MCH: 29.1 pg (ref 26.0–34.0)
MCHC: 32 g/dL (ref 30.0–36.0)
MCV: 90.9 fL (ref 80.0–100.0)
Platelets: 338 10*3/uL (ref 150–400)
RBC: 4.19 MIL/uL (ref 3.87–5.11)
RDW: 12.8 % (ref 11.5–15.5)
WBC: 4.4 10*3/uL (ref 4.0–10.5)
nRBC: 0 % (ref 0.0–0.2)

## 2023-07-05 LAB — BASIC METABOLIC PANEL
Anion gap: 9 (ref 5–15)
BUN: 16 mg/dL (ref 8–23)
CO2: 27 mmol/L (ref 22–32)
Calcium: 9.2 mg/dL (ref 8.9–10.3)
Chloride: 101 mmol/L (ref 98–111)
Creatinine, Ser: 0.73 mg/dL (ref 0.44–1.00)
GFR, Estimated: >60 mL/min (ref >60)
Glucose, Bld: 132 mg/dL — ABNORMAL HIGH (ref 70–99)
Potassium: 4.2 mmol/L (ref 3.5–5.1)
Sodium: 137 mmol/L (ref 135–145)

## 2023-07-05 MED ORDER — FUROSEMIDE 20 MG PO TABS: 20.0000 mg | ORAL_TABLET | Freq: Two times a day (BID) | ORAL | 3 refills | Status: DC

## 2023-07-05 NOTE — Telephone Encounter (Signed)
LVM to RC 

## 2023-07-05 NOTE — Patient Instructions (Signed)
Medication Instructions:  Your physician recommends the following medication changes.  DECREASE: Lasix 20 mg twice daily  *If you need a refill on your cardiac medications before your next appointment, please call your pharmacy*   Lab Work: Your provider would like for you to have following labs drawn today BMET and CBC in the hospital.   If you have labs (blood work) drawn today and your tests are completely normal, you will receive your results only by: MyChart Message (if you have MyChart) OR A paper copy in the mail If you have any lab test that is abnormal or we need to change your treatment, we will call you to review the results.   Follow-Up: At [facility], you and your health needs are our priority.  As part of our continuing mission to provide you with exceptional heart care, we have created designated Provider Care Teams.  These Care Teams include your primary Cardiologist (physician) and Advanced Practice Providers (APPs -  Physician Assistants and Nurse Practitioners) who all work together to provide you with the care you need, when you need it.  We recommend signing up for the patient portal called "MyChart".  Sign up information is provided on this After Visit Summary.  MyChart is used to connect with patients for Virtual Visits (Telemedicine).  Patients are able to view lab/test results, encounter notes, upcoming appointments, etc.  Non-urgent messages can be sent to your provider as well.   To learn more about what you can do with MyChart, go to [email or web address].    Your next appointment:   1 month(s)  Provider:   You may see Yvonne Kendall, MD or one of the following Advanced Practice Providers on your designated Care Team:   Eula Listen, [place]

## 2023-07-08 DIAGNOSIS — M1711 Unilateral primary osteoarthritis, right knee: Secondary | ICD-10-CM | POA: Diagnosis not present

## 2023-07-08 MED ORDER — TIRZEPATIDE 2.5 MG/0.5ML ~~LOC~~ SOAJ: 2.5000 mg | SUBCUTANEOUS | Status: DC

## 2023-07-08 NOTE — Telephone Encounter (Signed)
Please call patient to schedule FU. Is due in 4-6 weeks.

## 2023-07-08 NOTE — Progress Notes (Signed)
Medication Samples have been provided to the patient.  Drug name: Mounjaro 2.5 mg   Qty: 1 box   LOT: [id number]  Exp.Date: 01/22/2025  The patient has been instructed regarding the correct time, dose, and frequency of taking this medication, including desired effects and most common side effects.   Medication sample provided by provider Modena Nunnery, MD.

## 2023-07-08 NOTE — Telephone Encounter (Signed)
Was able to reach patient and she said to disregard message. She needs FU in 4-6 weeks, will have admin call.

## 2023-07-08 NOTE — Addendum Note (Signed)
Addended by: Ginette Pitman on: 07/08/2023 01:17 PM   Modules accepted: Orders

## 2023-07-09 ENCOUNTER — Telehealth: Payer: Self-pay | Admitting: Internal Medicine

## 2023-07-09 NOTE — Telephone Encounter (Signed)
Pt made aware of MD's recommendations and stated she would prefer to hold off on metoprolol for now. Medication list updated

## 2023-07-09 NOTE — Telephone Encounter (Signed)
Pt c/o medication issue:  1. Name of Medication:  metoprolol tartrate (LOPRESSOR) 25 MG tablet  2. How are you currently taking this medication (dosage and times per day)?  Twice daily, as prescribed  3. Are you having a reaction (difficulty breathing--STAT)?   4. What is your medication issue?  Patient states Metoprolol causes too many side effects and she will not be able to continue taking it. She states her sugar got up above 600 and she was in the ICU. She has also had swelling in her legs and feet. She has been fatigued and completely lethargic. She assumes Metoprolol has caused these symptoms based on information she read and she stopped taking it on Saturday, 12/07. She states she feels much better and her sugar level/swelling has gone down since stopping it.

## 2023-07-09 NOTE — Telephone Encounter (Signed)
Please let Ms. Altic know that metoprolol is not a typical cause of hyperglycemia.  Per the hospital notes, it sounds like she was not able to get the [facility] prescription filled over the holiday weekend (this is a diabetes medication intended to lower her blood glucose).  I suspect that not having enough diabetic medication in her system is the more likely reason for her marked hyperglycemia.  It is fine if she wishes to defer restarting metoprolol for now.  She can follow-up with [name] in the office as previously scheduled, sooner if new concerns arise.  I recommend that she speak with her PCP and/or endocrinologist (if she has 1) about improving her glycemic control.

## 2023-07-10 ENCOUNTER — Encounter: Payer: Self-pay | Admitting: Gastroenterology

## 2023-07-10 ENCOUNTER — Encounter: Payer: Self-pay | Admitting: Oncology

## 2023-07-11 ENCOUNTER — Other Ambulatory Visit: Payer: Self-pay | Admitting: Pediatrics

## 2023-07-11 ENCOUNTER — Encounter: Payer: Self-pay | Admitting: Pediatrics

## 2023-07-11 ENCOUNTER — Ambulatory Visit (INDEPENDENT_AMBULATORY_CARE_PROVIDER_SITE_OTHER): Payer: BC Managed Care – PPO | Admitting: Pediatrics

## 2023-07-11 VITALS — BP 99/66 | HR 99 | Temp 97.4°F | Resp 14 | Wt 120.8 lb

## 2023-07-11 DIAGNOSIS — E1165 Type 2 diabetes mellitus with hyperglycemia: Secondary | ICD-10-CM | POA: Diagnosis not present

## 2023-07-11 DIAGNOSIS — R399 Unspecified symptoms and signs involving the genitourinary system: Secondary | ICD-10-CM | POA: Diagnosis not present

## 2023-07-11 DIAGNOSIS — Z794 Long term (current) use of insulin: Secondary | ICD-10-CM

## 2023-07-11 DIAGNOSIS — N949 Unspecified condition associated with female genital organs and menstrual cycle: Secondary | ICD-10-CM | POA: Diagnosis not present

## 2023-07-11 DIAGNOSIS — G43009 Migraine without aura, not intractable, without status migrainosus: Secondary | ICD-10-CM | POA: Diagnosis not present

## 2023-07-11 DIAGNOSIS — Z1330 Encounter for screening examination for mental health and behavioral disorders, unspecified: Secondary | ICD-10-CM | POA: Diagnosis not present

## 2023-07-11 DIAGNOSIS — R2681 Unsteadiness on feet: Secondary | ICD-10-CM

## 2023-07-11 LAB — MICROALBUMIN, URINE WAIVED
Creatinine, Urine Waived: 50 mg/dL (ref 10–300)
Microalb, Ur Waived: 30 mg/L — ABNORMAL HIGH (ref 0–19)

## 2023-07-11 LAB — URINALYSIS, ROUTINE W REFLEX MICROSCOPIC
Bilirubin, UA: NEGATIVE
Ketones, UA: NEGATIVE
Nitrite, UA: POSITIVE — AB
Protein,UA: NEGATIVE
Specific Gravity, UA: 1.015 (ref 1.005–1.030)
Urobilinogen, Ur: 0.2 mg/dL (ref 0.2–1.0)
pH, UA: 5.5 (ref 5.0–7.5)

## 2023-07-11 LAB — WET PREP FOR TRICH, YEAST, CLUE
Clue Cell Exam: NEGATIVE
Trichomonas Exam: NEGATIVE
Yeast Exam: NEGATIVE

## 2023-07-11 LAB — MICROSCOPIC EXAMINATION

## 2023-07-11 NOTE — Patient Instructions (Signed)
Continue taking mounjaro and checking your sugars

## 2023-07-11 NOTE — Assessment & Plan Note (Signed)
Persistent hyperglycemia despite Mounjaro therapy. Symptoms of lethargy, polyuria, and polydipsia likely secondary to hyperglycemia. Possible UTI due to high sugars creating an environment for infection. Glucose coming down this past week. Continue mounjaro therapy. Will assess if has recurrent UTI contributing to symptoms.  -Test for UTI today due to symptoms of burning and cloudy urine. -Continue Mounjaro and monitor blood glucose levels. -Plan for follow-up in 2 weeks to reassess blood glucose control and symptoms.

## 2023-07-11 NOTE — Progress Notes (Signed)
Office Visit  BP 99/66 (BP Location: Left Arm, Patient Position: Sitting, Cuff Size: Normal)   Pulse 99   Temp (!) 97.4 F (36.3 C) (Oral)   Resp 14   Wt 120 lb 12.8 oz (54.8 kg)   LMP  (LMP Unknown)   SpO2 99%   BMI 23.59 kg/m    Subjective:    Patient ID: Amy Hansen, female    DOB: 21-Jul-1956, 67 y.o.   MRN: [id number]  HPI: Amy Hansen is a 67 y.o. female  Chief Complaint  Patient presents with   Diabetes    2 doses of 2.5 mg Mounjaro. No noticed any particular symptoms since starting.     Discussed the use of AI scribe software for clinical note transcription with the patient, who gave verbal consent to proceed.  History of Present Illness   The patient, with a history of diabetes, stroke, and a recent hospital admission, presents with extreme lethargy that started the week of her hospital admission. She reports high blood sugar levels, despite being on Mounjaro, with recent readings in the 300s that have decreased to mid 160s in the last 3 days. Only checking fasting levels. The patient also reports some burning during urination and describes her urine as cloudy and very yellow. She denies any vaginal discomfort or itchiness.  The patient also reports a persistent headache since Monday, which has not responded to Tylenol and ibuprofen. She expresses concern about her slow speech and lack of motivation, fearing she might be having a stroke. She also reports numbness and tingling in her face, feet, hands, and arms.  The patient has a history of falls, with approximately 15-20 incidents in the past year. She reports feeling unsteady and weak, particularly in her left arm, which she broke recently. She expresses concern about severe nerve damage in this arm, which has left her feeling handicapped as she is left-handed.  The patient also reports feeling extremely tired and unable to complete tasks such as grocery shopping. She expresses a desire for some form of exercise  program for seniors, as she feels her muscles are weak. She also reports pins and needles sensation in her right hand joint, extending to her finger.  The patient has recently retired and is concerned about losing her health insurance. She reports feeling pressure in her stomach, which she describes as similar to anxiety.      Relevant past medical, surgical, family and social history reviewed and updated as indicated. Interim medical history since our last visit reviewed. Allergies and medications reviewed and updated.  ROS per HPI unless specifically indicated above     Objective:    BP 99/66 (BP Location: Left Arm, Patient Position: Sitting, Cuff Size: Normal)   Pulse 99   Temp (!) 97.4 F (36.3 C) (Oral)   Resp 14   Wt 120 lb 12.8 oz (54.8 kg)   LMP  (LMP Unknown)   SpO2 99%   BMI 23.59 kg/m   Wt Readings from Last 3 Encounters:  07/11/23 120 lb 12.8 oz (54.8 kg)  07/05/23 115 lb 9.6 oz (52.4 kg)  07/02/23 123 lb (55.8 kg)     Physical Exam Constitutional:      Appearance: Normal appearance.  HENT:     Head: Normocephalic and atraumatic.  Eyes:     Pupils: Pupils are equal, round, and reactive to light.  Cardiovascular:     Rate and Rhythm: Normal rate and regular rhythm.     Pulses: Normal  pulses.     Heart sounds: Normal heart sounds.  Pulmonary:     Effort: Pulmonary effort is normal.     Breath sounds: Normal breath sounds.  Abdominal:     General: Abdomen is flat.     Palpations: Abdomen is soft.  Musculoskeletal:        General: Normal range of motion.     Cervical back: Normal range of motion.  Skin:    General: Skin is warm and dry.     Capillary Refill: Capillary refill takes less than 2 seconds.  Neurological:     General: No focal deficit present.     Mental Status: She is alert and oriented to person, place, and time. Mental status is at baseline.     Cranial Nerves: Cranial nerves 2-12 are intact.     Sensory: Sensation is intact.     Motor:  Motor function is intact.     Coordination: Coordination is intact. Romberg sign negative. Coordination normal. Finger-Nose-Finger Test and Heel to [facility] Test normal. Rapid alternating movements normal.     Gait: Gait is intact.     Deep Tendon Reflexes: Reflexes are normal and symmetric.     Comments: Motor testing induced shoulder pain on left upper extremity otherwise no positive findings during testing.  Psychiatric:        Mood and Affect: Mood normal.        Behavior: Behavior normal.         07/11/2023   10:41 AM 01/04/2023    2:54 PM 12/31/2022    2:13 PM 07/18/2022    8:48 AM 01/18/2022    3:24 PM  Depression screen PHQ 2/9  Decreased Interest 3 2 2 1 2   Down, Depressed, Hopeless 3 3 3 1 2   PHQ - 2 Score 6 5 5 2 4   Altered sleeping 3 3 3 3 2   Tired, decreased energy 3 2 2 3 3   Change in appetite 3 3 3 1 3   Feeling bad or failure about yourself  3 3 3  0 3  Trouble concentrating 3 3 3 3 3   Moving slowly or fidgety/restless 3 0 3 1 3   Suicidal thoughts 3 2 3  0 1  PHQ-9 Score 27 21 25 13 22   Difficult doing work/chores Extremely dIfficult Extremely dIfficult Extremely dIfficult Somewhat difficult Extremely dIfficult       07/11/2023   10:45 AM 12/31/2022    2:15 PM 01/18/2022    3:25 PM 08/01/2021    9:36 AM  GAD 7 : Generalized Anxiety Score  Nervous, Anxious, on Edge 3 3 3 3   Control/stop worrying 3 3 2 3   Worry too much - different things 3 3 3 3   Trouble relaxing 3 3 2 3   Restless 3 3 3 3   Easily annoyed or irritable 3 3 3 1   Afraid - awful might happen 3 3 3 1   Total GAD 7 Score 21 21 19 17   Anxiety Difficulty Extremely difficult Extremely difficult Very difficult Somewhat difficult       Assessment & Plan:  Assessment & Plan   Type 2 diabetes mellitus with hyperglycemia, with long-term current use of insulin (HCC) Urinary tract infection symptoms Assessment & Plan: Persistent hyperglycemia despite Mounjaro therapy. Symptoms of lethargy, polyuria, and  polydipsia likely secondary to hyperglycemia. Possible UTI due to high sugars creating an environment for infection. Glucose coming down this past week. Continue mounjaro therapy. Will assess if has recurrent UTI contributing to symptoms. Encouraged hydration, as  suspect low BP from dehydration. -Test for UTI today due to symptoms of burning and cloudy urine. -Continue Mounjaro and monitor blood glucose levels. -Plan for follow-up in 2 weeks to reassess blood glucose control and symptoms. -Encourage increased fluid intake, particularly water.  Orders: -     Microalbumin, Urine Waived -     Urine Culture -     Urinalysis, Routine w reflex microscopic  Migraine without aura and without status migrainosus, not intractable Assessment & Plan: New onset headache since Monday, unresponsive to Tylenol and ibuprofen. She is very concerned about a stroke given her history and comorbid fatigue. No neurologic deficits on examination. Offered head imaging given her concerns but opts to work on DM control as I have highest suspicion that poor glycemic control is driving fatigue vs a stroke. Previously on nurtec but had insurance issues, this worked for her before.  -Provide sample of Nurtec to help manage current headache. -Consider head imaging if headache persists or worsens, or if new neurologic symptoms develop.  Vaginal discomfort Unable to characterize sx between vaginal and urinary etiology. Will send swabs.  -     WET PREP FOR TRICH, YEAST, CLUE  Unsteady gait Chronic issue. History of multiple falls. She is interested in fall risk assessment. -Refer to physical therapy for fall evaluation. -Contact neurologist for recommendations regarding nerve damage and weakness. -     Ambulatory referral to Physical Therapy  Encounter for behavioral health screening As part of their intake evaluation, the patient was screened for depression, anxiety.  PHQ9 SCORE 27, GAD7 SCORE 21. Screening results  positive for tested conditions. Follows with psychiatry. Encouraged to move up appointment. Worsened symptoms in setting of worsened medical conditions. No acute safety concerns at this time.  Follow up plan: Return in about 2 weeks (around 07/25/2023) for DM, headaches.  Gennett Garcia Howell Pringle, MD

## 2023-07-11 NOTE — Assessment & Plan Note (Addendum)
New onset headache since Monday, unresponsive to Tylenol and ibuprofen. She is very concerned about a stroke given her history and comorbid fatigue. No neurologic deficits on examination. Offered head imaging given her concerns but opts to work on DM control as I have highest suspicion that poor glycemic control is driving fatigue vs a stroke. Previously on nurtec but had insurance issues, this worked for her before.  -Provide sample of Nurtec to help manage current headache. -Consider head imaging if headache persists or worsens, or if new neurologic symptoms develop.

## 2023-07-12 ENCOUNTER — Other Ambulatory Visit: Payer: Self-pay | Admitting: Pediatrics

## 2023-07-12 ENCOUNTER — Other Ambulatory Visit: Payer: Self-pay

## 2023-07-12 ENCOUNTER — Telehealth: Payer: Self-pay | Admitting: Nurse Practitioner

## 2023-07-12 ENCOUNTER — Telehealth: Payer: Self-pay | Admitting: Physician Assistant

## 2023-07-12 DIAGNOSIS — N3000 Acute cystitis without hematuria: Secondary | ICD-10-CM

## 2023-07-12 MED ORDER — FOSFOMYCIN TROMETHAMINE 3 G PO PACK
3.0000 g | PACK | Freq: Once | ORAL | 0 refills | Status: AC
Start: 2023-07-12 — End: 2023-07-12

## 2023-07-12 MED ORDER — ALPRAZOLAM 0.5 MG PO TABS: 0.5000 mg | ORAL_TABLET | Freq: Two times a day (BID) | ORAL | 0 refills | Status: DC | PRN

## 2023-07-12 NOTE — Telephone Encounter (Signed)
Routing to provider to advise.  

## 2023-07-12 NOTE — Progress Notes (Signed)
   07/12/2023  Patient ID: Amy Hansen, female   DOB: May 26, 1956, 67 y.o.   MRN: [id number]  Dr. Evelene Croon inquiring about antibiotic recommendation for UTI.  Patient has severe allergies to sulfa and fluoroquinolone antibiotics.  Sensitiviyy results have not yet returned, but patient wishes to start therapy ASAP.  Klebsiella aerogenes bacteria present, and last sensitivity report shows sensitivity to only oral antibiotics patient is allergic to.  I suggested fosfomycin since it is broad spectrum and most klebsiella aerogenes are not resistant to it- it also does not have known cross-sensitivity relative to her antibiotic allergies listed.  Information provided to Dr. Evelene Croon.  Lenna Gilford, PharmD, DPLA

## 2023-07-12 NOTE — Telephone Encounter (Signed)
Patient called in for refill on Alprazolam 0.5mg  states she only has three left enough to get her through tomorrow. Pharmacy closes at 2pm tomorrow and she would like prescription sent over. Ph: [phone] Appt 1/28 Pharmacy [facility] [address] [place], [place]

## 2023-07-12 NOTE — Telephone Encounter (Signed)
Pended Alprazolam 0.5 mg to requested pharmacy.

## 2023-07-12 NOTE — Progress Notes (Signed)
Fosfomycin 3g 1 dose sent for acute cystitis while cultures pending.  Falesha Schommer Howell Pringle, MD

## 2023-07-12 NOTE — Telephone Encounter (Signed)
Patient called and states she saw Dr Evelene Croon yesterday 07/11/2023 and states they are waiting for her Culture to come back but patient states it being Friday and at lunch that she needs medication sent in, anything for her UTI. Patient does not want to get caught through the weekend without any type of medication and she states she is worried she will wind up in the Hospital. Please advise and follow back up with the patient at phone # [phone].

## 2023-07-12 NOTE — Telephone Encounter (Signed)
Called and notified patient about medication being sent in.

## 2023-07-15 DIAGNOSIS — M1711 Unilateral primary osteoarthritis, right knee: Secondary | ICD-10-CM | POA: Diagnosis not present

## 2023-07-17 LAB — URINE CULTURE

## 2023-07-19 ENCOUNTER — Telehealth: Payer: Self-pay | Admitting: Nurse Practitioner

## 2023-07-19 NOTE — Telephone Encounter (Signed)
Copied from CRM [id number]. Topic: General - Other >> Jul 19, 2023 11:27 AM Everette C wrote: Reason for CRM: The patient would like to speak with a member of clinical staff when possible about their recent labs from 07/11/23  Please contact further when available

## 2023-07-19 NOTE — Telephone Encounter (Signed)
Called and discussed lab results with the patient based on Dr. Carie Caddy result message.

## 2023-07-22 DIAGNOSIS — M1711 Unilateral primary osteoarthritis, right knee: Secondary | ICD-10-CM | POA: Diagnosis not present

## 2023-07-25 ENCOUNTER — Ambulatory Visit (INDEPENDENT_AMBULATORY_CARE_PROVIDER_SITE_OTHER): Payer: BC Managed Care – PPO | Admitting: Nurse Practitioner

## 2023-07-25 ENCOUNTER — Encounter: Payer: Self-pay | Admitting: Nurse Practitioner

## 2023-07-25 ENCOUNTER — Telehealth: Payer: Self-pay | Admitting: Internal Medicine

## 2023-07-25 VITALS — BP 118/75 | HR 96 | Temp 98.2°F | Ht 60.0 in | Wt 119.2 lb

## 2023-07-25 DIAGNOSIS — R079 Chest pain, unspecified: Secondary | ICD-10-CM | POA: Diagnosis not present

## 2023-07-25 DIAGNOSIS — R300 Dysuria: Secondary | ICD-10-CM | POA: Diagnosis not present

## 2023-07-25 LAB — URINALYSIS, ROUTINE W REFLEX MICROSCOPIC
Bilirubin, UA: NEGATIVE
Ketones, UA: NEGATIVE
Leukocytes,UA: NEGATIVE
Nitrite, UA: NEGATIVE
Protein,UA: NEGATIVE
RBC, UA: NEGATIVE
Specific Gravity, UA: 1.01 (ref 1.005–1.030)
Urobilinogen, Ur: 0.2 mg/dL (ref 0.2–1.0)
pH, UA: 6 (ref 5.0–7.5)

## 2023-07-25 NOTE — Telephone Encounter (Signed)
Pt called c/o chest pressure x 2 days. She stated she would experience off and on pain, but the pressure was constant. Pt denies symptoms currently. She stated she is currently at her pcp office and having difficulty breathing walking from her car into the office.   Pt stated she will have her pcp evaluate her but wanted to make Dr. Okey Dupre aware.

## 2023-07-25 NOTE — Telephone Encounter (Signed)
Patient seen today by PCP, who felt that symptoms were most likely anxiety related.  However, if symptoms worsen, she should go to the ED for further evaluation.  Otherwise, we will follow-up as planned next month.  Yvonne Kendall, MD [facility]

## 2023-07-25 NOTE — Telephone Encounter (Signed)
  Pt c/o of Chest Pain: STAT if active CP, including tightness, pressure, jaw pain, radiating pain to shoulder/upper arm/back, CP unrelieved by Nitro. Symptoms reported of SOB, nausea, vomiting, sweating.  1. Are you having CP right now?   No  2. Are you experiencing any other symptoms (ex. SOB, nausea, vomiting, sweating)?   SOB with movement  3. Is your CP continuous or coming and going?   Coming and going  4. Have you taken Nitroglycerin?   Yes, at 11:40 am today  5. How long have you been experiencing CP?   Since Christmas Eve  6. If NO CP at time of call then end call with telling Pt to call back or call 911 if Chest pain returns prior to return call from triage team.   Patient stated wanted to let her cardiologist know that she is having chest pains and is on her way to her primary care doctor's office for a scheduled visit.  Patient noted she had stopped taking metoprolol for 2 days several weeks ago due to symptoms but has since been taking the medication.

## 2023-07-25 NOTE — Progress Notes (Signed)
BP 118/75 (BP Location: Left Arm, Patient Position: Sitting, Cuff Size: Normal)   Pulse 96   Temp 98.2 F (36.8 C) (Oral)   Ht 5' (1.524 m)   Wt 119 lb 3.2 oz (54.1 kg)   LMP  (LMP Unknown)   SpO2 98%   BMI 23.28 kg/m    Subjective:    Patient ID: Amy Hansen, female    DOB: 02/07/1956, 67 y.o.   MRN: [id number]  HPI: Amy Hansen is a 67 y.o. female  Chief Complaint  Patient presents with   2 Week Follow-up   Headache   Diabetes   possible UTI   Chest Pain    Has been going on since christmas eve, has taken nitroglycerin for the past 3 days    DIABETES Patient states her sugars are improving.  She was running low 200s then had a steroid shot on Friday December 20 and that made her sugars go up to 300-500.  Patient is tolerating Mounjaro 2.5mg  well.  She will increase to [facility] 5mg  this weekend.   Hypoglycemic episodes:no Polydipsia/polyuria: yes Visual disturbance: yes Chest pain: yes  CHEST PAIN Patient states she started having chest pain on Christmas Eve.  States she is having trouble with mental clarity.  She has had to take nitroglycerin 3 x since christmas eve.  She feels a lot of pressure and feeling a stabbing sensation between her shoulders in her back.  She is having a lot of anxiety.    Patient is having dysuria when she went to the bathroom this morning. She is already on keflex for prevention.  Symptoms had resolved after completing the course of fosfomycin, but returned this morning.    Relevant past medical, surgical, family and social history reviewed and updated as indicated. Interim medical history since our last visit reviewed. Allergies and medications reviewed and updated.  Review of Systems  Per HPI unless specifically indicated above     Objective:    BP 118/75 (BP Location: Left Arm, Patient Position: Sitting, Cuff Size: Normal)   Pulse 96   Temp 98.2 F (36.8 C) (Oral)   Ht 5' (1.524 m)   Wt 119 lb 3.2 oz (54.1 kg)   LMP  (LMP  Unknown)   SpO2 98%   BMI 23.28 kg/m   Wt Readings from Last 3 Encounters:  07/25/23 119 lb 3.2 oz (54.1 kg)  07/11/23 120 lb 12.8 oz (54.8 kg)  07/05/23 115 lb 9.6 oz (52.4 kg)    Physical Exam  Results for orders placed or performed in visit on 07/11/23  Urine Culture   Collection Time: 07/11/23 11:16 AM   Specimen: Urine   UR  Result Value Ref Range   Urine Culture, Routine Final report (A)    Organism ID, Bacteria Klebsiella aerogenes (A)    Antimicrobial Susceptibility Comment   Microscopic Examination   Collection Time: 07/11/23 11:16 AM   Urine  Result Value Ref Range   WBC, UA 0-5 0 - 5 /hpf   RBC, Urine 0-2 0 - 2 /hpf   Epithelial Cells (non renal) 0-10 0 - 10 /hpf   Mucus, UA Present (A) Not Estab.   Bacteria, UA Many (A) None seen/Few  Urinalysis, Routine w reflex microscopic   Collection Time: 07/11/23 11:16 AM  Result Value Ref Range   Specific Gravity, UA 1.015 1.005 - 1.030   pH, UA 5.5 5.0 - 7.5   Color, UA Yellow Yellow   Appearance Ur Cloudy (A) Clear  Leukocytes,UA 1+ (A) Negative   Protein,UA Negative Negative/Trace   Glucose, UA Trace (A) Negative   Ketones, UA Negative Negative   RBC, UA Trace (A) Negative   Bilirubin, UA Negative Negative   Urobilinogen, Ur 0.2 0.2 - 1.0 mg/dL   Nitrite, UA Positive (A) Negative   Microscopic Examination See below:   WET PREP FOR TRICH, YEAST, CLUE   Collection Time: 07/11/23 11:17 AM   Specimen: Sterile Swab   Sterile Swab  Result Value Ref Range   Trichomonas Exam Negative Negative   Yeast Exam Negative Negative   Clue Cell Exam Negative Negative  Microalbumin, Urine Waived (STAT)   Collection Time: 07/11/23 11:20 AM  Result Value Ref Range   Microalb, Ur Waived 30 (H) 0 - 19 mg/L   Creatinine, Urine Waived 50 10 - 300 mg/dL   Microalb/Creat Ratio 30-300 (H) <30 mg/g      Assessment & Plan:   Problem List Items Addressed This Visit   None Visit Diagnoses       Chest pain, unspecified type     -  Primary Has used Nitro x 3 in the last 2 days with improvement in symptoms. EKG done in office today showed NSR with a R BBB. Suspect it is related to anxiety which is not well controlled. Will have patient follow up with Cardiology for further evaluation and management. Has an appt with Cardiology on January 6.  Also recommend following up with Psychiatry to get anxiety better controlled.   Relevant Orders   EKG 12-Lead     Dysuria    UA checked at visit today.  Will treat based on results.  Patient already on Keflex for prevention.   Relevant Orders   Urinalysis, Routine w reflex microscopic        Follow up plan: No follow-ups on file.

## 2023-07-26 NOTE — Telephone Encounter (Signed)
Pt made aware and verbalized understanding.

## 2023-07-29 DIAGNOSIS — N898 Other specified noninflammatory disorders of vagina: Secondary | ICD-10-CM | POA: Diagnosis not present

## 2023-07-29 DIAGNOSIS — R350 Frequency of micturition: Secondary | ICD-10-CM | POA: Diagnosis not present

## 2023-07-29 DIAGNOSIS — N390 Urinary tract infection, site not specified: Secondary | ICD-10-CM | POA: Diagnosis not present

## 2023-08-01 ENCOUNTER — Encounter: Payer: Self-pay | Admitting: Gastroenterology

## 2023-08-01 ENCOUNTER — Other Ambulatory Visit: Payer: Self-pay | Admitting: Physician Assistant

## 2023-08-01 ENCOUNTER — Encounter: Payer: Self-pay | Admitting: Oncology

## 2023-08-01 NOTE — Telephone Encounter (Signed)
 Called pt, she said she is taking trazodone 50 mg.

## 2023-08-02 ENCOUNTER — Ambulatory Visit: Payer: BC Managed Care – PPO | Admitting: Family Medicine

## 2023-08-02 ENCOUNTER — Ambulatory Visit: Payer: Self-pay

## 2023-08-02 NOTE — Telephone Encounter (Signed)
  Chief Complaint: continued severe itching to inside and outside vagina and vulva, vulva appears purple in color Symptoms: itching, blood noted (pt stated more than a few drops) and burning, keeping her from sleeping Frequency: 07/27/23 Pertinent Negatives: Patient denies vaginal discharge, abd pain, fever Disposition: [] ED /[] Urgent Care (no appt availability in office) / [x] Appointment(In office/virtual)/ []  [place] [facility]/ [] Home Care/ [] Refused Recommended Disposition /[] [place] [facility]/ []  Follow-up with PCP Additional Notes:Pt was seen Monday wand see lab results. Pt was started on Doxycycline  and has been on it x 5 days without improvement.  Reason for Disposition  [1] Vaginal itching AND [2] not improved > 3 days following Care Advice  Answer Assessment - Initial Assessment Questions 1. SYMPTOM: What's the main symptom you're concerned about? (e.g., pain, itching, dryness)     itching 2. LOCATION: Where is the  itching located? (e.g., inside/outside, left/right)     Inside/outside and vulva- vulvar is purple.  3. ONSET: When did the  itching   start?     07/27/23 4. PAIN: Is there any pain? If Yes, ask: How bad is it? (Scale: 1-10; mild, moderate, severe)   -  MILD (1-3): Doesn't interfere with normal activities.    -  MODERATE (4-7): Interferes with normal activities (e.g., work or school) or awakens from sleep.     -  SEVERE (8-10): Excruciating pain, unable to do any normal activities.     Burning from scratching 5. ITCHING: Is there any itching? If Yes, ask: How bad is it? (Scale: 1-10; mild, moderate, severe)     yes 6. CAUSE: What do you think is causing the discharge? Have you had the same problem before? What happened then?     No discharge 7. OTHER SYMPTOMS: Do you have any other symptoms? (e.g., fever, itching, vaginal bleeding, pain with urination, injury to genital area, vaginal foreign body)     Itching, bleeding more than  spots) 8. PREGNANCY: Is there any chance you are pregnant? When was your last menstrual period?     N/a  Protocols used: Vaginal Symptoms-A-AH

## 2023-08-05 ENCOUNTER — Ambulatory Visit: Payer: BC Managed Care – PPO | Admitting: Internal Medicine

## 2023-08-06 ENCOUNTER — Encounter: Payer: Self-pay | Admitting: Oncology

## 2023-08-06 ENCOUNTER — Encounter: Payer: Self-pay | Admitting: Gastroenterology

## 2023-08-06 ENCOUNTER — Ambulatory Visit (INDEPENDENT_AMBULATORY_CARE_PROVIDER_SITE_OTHER): Payer: Medicare Other | Admitting: Physician Assistant

## 2023-08-06 VITALS — BP 103/68 | HR 78 | Ht 65.0 in | Wt 119.0 lb

## 2023-08-06 DIAGNOSIS — L292 Pruritus vulvae: Secondary | ICD-10-CM | POA: Diagnosis not present

## 2023-08-06 DIAGNOSIS — N390 Urinary tract infection, site not specified: Secondary | ICD-10-CM

## 2023-08-06 DIAGNOSIS — Z87440 Personal history of urinary (tract) infections: Secondary | ICD-10-CM

## 2023-08-06 DIAGNOSIS — N950 Postmenopausal bleeding: Secondary | ICD-10-CM | POA: Diagnosis not present

## 2023-08-06 DIAGNOSIS — N958 Other specified menopausal and perimenopausal disorders: Secondary | ICD-10-CM | POA: Diagnosis not present

## 2023-08-06 LAB — MICROSCOPIC EXAMINATION
Epithelial Cells (non renal): >10 /[HPF] — AB (ref 0–10)
WBC, UA: >30 /[HPF] — AB (ref 0–5)

## 2023-08-06 LAB — URINALYSIS, COMPLETE
Bilirubin, UA: NEGATIVE
Glucose, UA: NEGATIVE
Ketones, UA: NEGATIVE
Nitrite, UA: NEGATIVE
RBC, UA: NEGATIVE
Specific Gravity, UA: 1.02 (ref 1.005–1.030)
Urobilinogen, Ur: 0.2 mg/dL (ref 0.2–1.0)
pH, UA: 6.5 (ref 5.0–7.5)

## 2023-08-06 MED ORDER — ESTRADIOL 0.1 MG/GM VA CREA: TOPICAL_CREAM | VAGINAL | 12 refills | Status: AC

## 2023-08-06 MED ORDER — FLUCONAZOLE 150 MG PO TABS: 150.0000 mg | ORAL_TABLET | Freq: Once | ORAL | 0 refills | Status: AC

## 2023-08-06 NOTE — Progress Notes (Signed)
 08/06/2023 10:08 AM   Amy Hansen 1956/07/27 [id number]  CC: Chief Complaint  Patient presents with   Other   HPI: Amy Hansen is a 68 y.o. female with PMH recurrent UTI versus urinary colonization on suppressive Keflex , GSM on estrogen cream, IC, left renal stone, and gross hematuria with benign workup in 2024 who presents today for 95-month follow-up.   She was recently treated for UTI per her PCP with fosfomycin and doxycycline .  She states she was dysuria at the time.  Ever since, she has had severe vulvovaginal itching and discharge.  She has been using coconut oil and Vagisil and continuing topical vaginal estrogen cream, however these are not helping.  She has also noticed 2 episodes of vaginal bleeding and is scheduled to see gynecology for this and her chronic vulvovaginal irritation/itch next month.  PMH: Past Medical History:  Diagnosis Date   Acute on chronic heart failure with preserved ejection fraction (HFpEF) (HCC) 05/16/2018   AKI (acute kidney injury) (HCC) 02/05/2023   Anemia    Anxiety    Arthritis    Blood transfusion without reported diagnosis    Cataract    CHF (congestive heart failure) (HCC)    Chronic kidney disease    UTI, hematuria in urine   Colitis    Crohn's disease (HCC)    Depression    Diabetes (HCC)    Diverticulosis    Frequent headaches    Interstitial cystitis    Recurrent UTI    Restless leg syndrome    TIA (transient ischemic attack) 02/20/2021   Urinary frequency     Surgical History: Past Surgical History:  Procedure Laterality Date   bariatric bypass  2012   BIOPSY  05/03/2020   Procedure: BIOPSY;  Surgeon: Teressa Toribio SQUIBB, MD;  Location: [facility] ENDOSCOPY;  Service: Endoscopy;;   CARPAL TUNNEL RELEASE Right 2003   CARPAL TUNNEL RELEASE Right    2008   CHOLECYSTECTOMY  1975   COLONOSCOPY     COLONOSCOPY WITH PROPOFOL  N/A 05/03/2020   Procedure: COLONOSCOPY WITH PROPOFOL ;  Surgeon: Teressa Toribio SQUIBB, MD;  Location:  [facility] ENDOSCOPY;  Service: Endoscopy;  Laterality: N/A;   CORONARY STENT INTERVENTION N/A 05/15/2023   Procedure: CORONARY STENT INTERVENTION;  Surgeon: Mady Bruckner, MD;  Location: [facility] INVASIVE CV LAB;  Service: Cardiovascular;  Laterality: N/A;   CYSTOSCOPY W/ RETROGRADES Bilateral 06/06/2015   Procedure: CYSTOSCOPY WITH RETROGRADE PYELOGRAM;  Surgeon: Donnice Brooks, MD;  Location: [facility] ORS;  Service: Urology;  Laterality: Bilateral;   EYE SURGERY Left 07/06/2022   FL INJ LEFT KNEE CT ARTHROGRAM ([facility] HX) Left    1995   GASTRIC BYPASS  2010   HAND SURGERY Left 01/19/2021   Thumb   HEMORRHOID SURGERY  2013   KNEE ARTHROSCOPY Left 1996   LEFT HEART CATH AND CORONARY ANGIOGRAPHY Left 05/15/2023   Procedure: LEFT HEART CATH AND CORONARY ANGIOGRAPHY;  Surgeon: Mady Bruckner, MD;  Location: [facility] INVASIVE CV LAB;  Service: Cardiovascular;  Laterality: Left;   TONSILLECTOMY     TOTAL ABDOMINAL HYSTERECTOMY W/ BILATERAL SALPINGOOPHORECTOMY      Home Medications:  Allergies as of 08/06/2023       Reactions   Avelox [moxifloxacin Hcl In Nacl] Anaphylaxis   Bactrim  [sulfamethoxazole -trimethoprim ] Anaphylaxis   Ciprofloxacin  Other (See Comments)   Pt states she was told never to take this as it is in the same family as Avelox.    Buspar  [buspirone ] Other (See Comments)   hallucinations   Neomycin -polymyxin-dexameth  Other (See Comments)   Burning in the eyes   Aquaphor [lanolin-petrolatum] Itching, Rash   Depakote [divalproex Sodium] Other (See Comments)   Unknown Reaction   Imitrex  [sumatriptan ] Other (See Comments)   Neck and shoulder pain   Stadol [butorphanol] Rash        Medication List        Accurate as of August 06, 2023 10:08 AM. If you have any questions, ask your nurse or doctor.          ALPRAZolam  0.5 MG tablet Commonly known as: XANAX  Take 1 tablet (0.5 mg total) by mouth 2 (two) times daily as needed for anxiety. Must last 15 days.   aspirin  EC 81 MG  tablet Take 1 tablet (81 mg) by mouth once daily. Swallow whole.   cephALEXin  250 MG capsule Commonly known as: KEFLEX  Take 250 mg by mouth daily.   clopidogrel  75 MG tablet Commonly known as: PLAVIX  Take 1 tablet (75 mg total) by mouth daily with breakfast.   furosemide  20 MG tablet Commonly known as: LASIX  Take 1 tablet (20 mg total) by mouth 2 (two) times daily.   insulin  glargine 100 UNIT/ML Solostar Pen Commonly known as: LANTUS  Inject 17 Units into the skin daily.   isosorbide  mononitrate 30 MG 24 hr tablet Commonly known as: IMDUR  Take 0.5 tablets (15 mg total) by mouth daily.   magnesium  30 MG tablet Take 30 mg by mouth 2 (two) times daily.   nitroGLYCERIN  0.4 MG SL tablet Commonly known as: NITROSTAT  Place 1 tablet (0.4 mg total) under the tongue every 5 (five) minutes as needed.   NovoLOG  FlexPen 100 UNIT/ML FlexPen Generic drug: insulin  aspart Inject 1 Units into the skin 3 (three) times daily with meals. Based on premeal blood sugar: 120-150- 1 unit 151-200- 2 units 201-250- 3 units 251-300- 4 units 301-350- 5 units 351-400- 6 units >400, take 6 units and repeat CBG in 1 hour   ondansetron  8 MG disintegrating tablet Commonly known as: ZOFRAN -ODT Take 1 tablet (8 mg total) by mouth every 8 (eight) hours as needed for nausea or vomiting.   pantoprazole  40 MG tablet Commonly known as: PROTONIX  Take 1 tablet (40 mg total) by mouth daily.   Pen Needles 3/16 31G X 5 MM Misc 1 Units by Does not apply route as needed.   potassium chloride  SA 20 MEQ tablet Commonly known as: Klor-Con  M20 Take 1 tablet (20 mEq total) by mouth 2 (two) times daily.   pramipexole  1 MG tablet Commonly known as: MIRAPEX  Take 1 tablet (1 mg total) by mouth at bedtime.   rOPINIRole  1 MG tablet Commonly known as: REQUIP  Take 1 mg by mouth 2 (two) times daily.   rosuvastatin  5 MG tablet Commonly known as: CRESTOR  Take 1 tablet by mouth once daily   sertraline  100 MG  tablet Commonly known as: Zoloft  Take 1 tablet (100 mg total) by mouth daily.   tirzepatide  2.5 MG/0.5ML Pen Commonly known as: MOUNJARO  Inject 2.5 mg into the skin once a week.   tirzepatide  5 MG/0.5ML Pen Commonly known as: MOUNJARO  Inject 5 mg into the skin once a week.   tirzepatide  2.5 MG/0.5ML Pen Commonly known as: MOUNJARO  Inject 2.5 mg into the skin once a week.   traZODone  50 MG tablet Commonly known as: DESYREL  Take 0.5-2 tablets (25-100 mg total) by mouth at bedtime as needed for sleep.   vitamin D3 25 MCG tablet Commonly known as: CHOLECALCIFEROL  Take 1 tablet (1,000 Units total) by mouth  daily.        Allergies:  Allergies  Allergen Reactions   Avelox [Moxifloxacin Hcl In Nacl] Anaphylaxis   Bactrim  [Sulfamethoxazole -Trimethoprim ] Anaphylaxis   Ciprofloxacin  Other (See Comments)    Pt states she was told never to take this as it is in the same family as Avelox.    Buspar  [Buspirone ] Other (See Comments)    hallucinations   Neomycin -Polymyxin-Dexameth Other (See Comments)    Burning in the eyes   Aquaphor [Lanolin-Petrolatum] Itching and Rash   Depakote [Divalproex Sodium] Other (See Comments)    Unknown Reaction   Imitrex  [Sumatriptan ] Other (See Comments)    Neck and shoulder pain   Stadol [Butorphanol] Rash    Family History: Family History  Problem Relation Age of Onset   Colon cancer Mother    Stroke Father    Heart failure Sister    Bladder Cancer Neg Hx    Kidney disease Neg Hx    Prostate cancer Neg Hx    Kidney cancer Neg Hx    Pancreatic cancer Neg Hx    Esophageal cancer Neg Hx    Stomach cancer Neg Hx    Rectal cancer Neg Hx    Breast cancer Neg Hx     Social History:   reports that she quit smoking about 48 years ago. Her smoking use included cigarettes. She has never used smokeless tobacco. She reports that she does not drink alcohol and does not use drugs.  Physical Exam: BP 103/68   Pulse 78   Ht 5' 5 (1.651 m)   Wt  119 lb (54 kg)   LMP  (LMP Unknown)   BMI 19.80 kg/m   Constitutional:  Alert and oriented, no acute distress, nontoxic appearing HEENT: [place], AT Cardiovascular: No clubbing, cyanosis, or edema Respiratory: Normal respiratory effort, no increased work of breathing Skin: No rashes, bruises or suspicious lesions Neurologic: Grossly intact, no focal deficits, moving all 4 extremities Psychiatric: Normal mood and affect  Assessment & Plan:   1. Recurrent UTI (Primary) Recently treated for breakthrough UTI.  Continue suppressive Keflex .  She asked to leave a UA on the way out today, will contact her with the results. - Urinalysis, Complete  2. Vulvovaginal pruritus Suspect vulvovaginal candidiasis in the setting of antibiotic use.  Will treat with 1 dose of oral Diflucan .  We discussed that it can take up to a week for her to feel better. - fluconazole  (DIFLUCAN ) 150 MG tablet; Take 1 tablet (150 mg total) by mouth once for 1 dose.  Dispense: 1 tablet; Refill: 0  3. Genitourinary syndrome of menopause I encouraged her to continue topical vaginal estrogen cream and I am refilling this today. - estradiol  (ESTRACE ) 0.1 MG/GM vaginal cream; Apply one pea-sized amount around the opening of the urethra 3 times weekly.  Dispense: 42.5 g; Refill: 12  4. Postmenopausal bleeding Agree with follow-up with gynecology next month for evaluation of postmenopausal bleeding and chronic vulvovaginal irritation/itch despite estrogen cream.  Return in about 6 months (around 02/03/2024) for rUTI follow up with Dr. Gaston.  Lucie Hones, PA-C  [facility] [place] [address], [address] [place], [place] [address] [phone]

## 2023-08-09 ENCOUNTER — Encounter: Payer: Self-pay | Admitting: Oncology

## 2023-08-09 ENCOUNTER — Encounter: Payer: Self-pay | Admitting: Physician Assistant

## 2023-08-09 ENCOUNTER — Ambulatory Visit (INDEPENDENT_AMBULATORY_CARE_PROVIDER_SITE_OTHER): Payer: Medicare Other | Admitting: Physician Assistant

## 2023-08-09 ENCOUNTER — Encounter: Payer: Self-pay | Admitting: Gastroenterology

## 2023-08-09 DIAGNOSIS — Z79899 Other long term (current) drug therapy: Secondary | ICD-10-CM

## 2023-08-09 DIAGNOSIS — G2581 Restless legs syndrome: Secondary | ICD-10-CM | POA: Diagnosis not present

## 2023-08-09 DIAGNOSIS — G4700 Insomnia, unspecified: Secondary | ICD-10-CM | POA: Diagnosis not present

## 2023-08-09 DIAGNOSIS — F411 Generalized anxiety disorder: Secondary | ICD-10-CM | POA: Diagnosis not present

## 2023-08-09 MED ORDER — ALPRAZOLAM 0.5 MG PO TABS: 0.5000 mg | ORAL_TABLET | Freq: Two times a day (BID) | ORAL | 5 refills | Status: DC | PRN

## 2023-08-09 NOTE — Progress Notes (Signed)
 [facility] Med Check  Patient ID: Amy Hansen,  MRN: [id number]  PCP: Melvin Pao, NP  Date of Evaluation: 08/09/2023 Time spent:25 minutes  Chief Complaint:  Chief Complaint   Anxiety; Follow-up    HISTORY/CURRENT STATUS: For routine med check.    Is feeling a lot better! Not taking the Xanax  twice a day every day now. Still most of the time, but happy to be able to take less.  That's a huge deal for her. Not having PA often. But still gets overwhelmed easily.  Increasing the Zoloft  at the last visit has been helpful with the anxiety too.  Patient is able to enjoy things.  Energy and motivation are good.  Has decided to retire.  No extreme sadness, tearfulness, or feelings of hopelessness.  Sleeps well with the Trazodone . ADLs and personal hygiene are normal.   Denies any changes in concentration, making decisions, or remembering things.  Appetite has not changed.  Weight is stable.   Denies suicidal or homicidal thoughts.  Denies dizziness, syncope, seizures, numbness, tingling, tremor, tics, unsteady gait, slurred speech, confusion. Denies muscle or joint pain, stiffness, or dystonia.  Blood sugars have been much better controlled.  Denies unexplained weight loss or sores that heal slowly.  No polyphagia, polydipsia, or polyuria. Denies visual changes or paresthesias.   Individual Medical History/ Review of Systems: Changes? :Yes   UTI and prob yeast vaginitis since LOV  Past medications for mental health diagnoses include: Trazodone , Risperdal , Zoloft , Lunesta, prazosin, Sonata, Prozac, Depakote, Lamictal , lithium, Wellbutrin , Xanax , Ambien , carbamazepine , Seroquel , Buspar  caused falls and hallucinations, Gabapentin -she doesn't want to take  Allergies: Avelox [moxifloxacin hcl in nacl], Bactrim  [sulfamethoxazole -trimethoprim ], Ciprofloxacin , Buspar  [buspirone ], Neomycin -polymyxin-dexameth, Aquaphor [lanolin-petrolatum], Depakote [divalproex sodium], Imitrex  [sumatriptan ],  and Stadol [butorphanol]  Current Medications:  Current Outpatient Medications:    aspirin  EC 81 MG tablet, Take 1 tablet (81 mg) by mouth once daily. Swallow whole., Disp: , Rfl:    cephALEXin  (KEFLEX ) 250 MG capsule, Take 250 mg by mouth daily., Disp: , Rfl:    cholecalciferol  (CHOLECALCIFEROL ) 25 MCG tablet, Take 1 tablet (1,000 Units total) by mouth daily., Disp: 30 tablet, Rfl: 3   clopidogrel  (PLAVIX ) 75 MG tablet, Take 1 tablet (75 mg total) by mouth daily with breakfast., Disp: 30 tablet, Rfl: 3   estradiol  (ESTRACE ) 0.1 MG/GM vaginal cream, Apply one pea-sized amount around the opening of the urethra 3 times weekly., Disp: 42.5 g, Rfl: 12   furosemide  (LASIX ) 20 MG tablet, Take 1 tablet (20 mg total) by mouth 2 (two) times daily., Disp: 180 tablet, Rfl: 3   insulin  aspart (NOVOLOG  FLEXPEN) 100 UNIT/ML FlexPen, Inject 1 Units into the skin 3 (three) times daily with meals. Based on premeal blood sugar: 120-150- 1 unit 151-200- 2 units 201-250- 3 units 251-300- 4 units 301-350- 5 units 351-400- 6 units >400, take 6 units and repeat CBG in 1 hour, Disp: 15 mL, Rfl: 11   insulin  glargine (LANTUS ) 100 UNIT/ML Solostar Pen, Inject 17 Units into the skin daily., Disp: 17 mL, Rfl: 11   Insulin  Pen Needle (PEN NEEDLES 3/16) 31G X 5 MM MISC, 1 Units by Does not apply route as needed., Disp: 100 each, Rfl: 2   isosorbide  mononitrate (IMDUR ) 30 MG 24 hr tablet, Take 0.5 tablets (15 mg total) by mouth daily., Disp: 45 tablet, Rfl: 3   magnesium  30 MG tablet, Take 30 mg by mouth 2 (two) times daily., Disp: , Rfl:    ondansetron  (ZOFRAN -ODT) 8 MG disintegrating tablet, Take  1 tablet (8 mg total) by mouth every 8 (eight) hours as needed for nausea or vomiting., Disp: 60 tablet, Rfl: 1   pantoprazole  (PROTONIX ) 40 MG tablet, Take 1 tablet (40 mg total) by mouth daily., Disp: 90 tablet, Rfl: 3   potassium chloride  SA (KLOR-CON  M20) 20 MEQ tablet, Take 1 tablet (20 mEq total) by mouth 2 (two) times daily.,  Disp: 90 tablet, Rfl: 3   pramipexole  (MIRAPEX ) 1 MG tablet, Take 1 tablet (1 mg total) by mouth at bedtime., Disp: 90 tablet, Rfl: 1   rOPINIRole  (REQUIP ) 1 MG tablet, Take 1 mg by mouth 2 (two) times daily., Disp: , Rfl:    rosuvastatin  (CRESTOR ) 5 MG tablet, Take 1 tablet by mouth once daily, Disp: 90 tablet, Rfl: 1   sertraline  (ZOLOFT ) 100 MG tablet, Take 1 tablet (100 mg total) by mouth daily., Disp: 30 tablet, Rfl: 1   tirzepatide  (MOUNJARO ) 2.5 MG/0.5ML Pen, Inject 2.5 mg into the skin once a week., Disp: 2 mL, Rfl: 0   tirzepatide  (MOUNJARO ) 2.5 MG/0.5ML Pen, Inject 2.5 mg into the skin once a week., Disp: , Rfl:    tirzepatide  (MOUNJARO ) 5 MG/0.5ML Pen, Inject 5 mg into the skin once a week., Disp: 6 mL, Rfl: 0   traZODone  (DESYREL ) 50 MG tablet, Take 0.5-2 tablets (25-100 mg total) by mouth at bedtime as needed for sleep., Disp: 60 tablet, Rfl: 0   ALPRAZolam  (XANAX ) 0.5 MG tablet, Take 1 tablet (0.5 mg total) by mouth 2 (two) times daily as needed for anxiety. Must last 15 days., Disp: 30 tablet, Rfl: 5   nitroGLYCERIN  (NITROSTAT ) 0.4 MG SL tablet, Place 1 tablet (0.4 mg total) under the tongue every 5 (five) minutes as needed. (Patient not taking: Reported on 08/09/2023), Disp: 25 tablet, Rfl: 3 Medication Side Effects: none  Family Medical/ Social History: Changes?  no  MENTAL HEALTH EXAM:  There were no vitals taken for this visit.There is no height or weight on file to calculate BMI.  General Appearance: Casual and Well Groomed  Eye Contact:  Good  Speech:  Clear and Coherent and Normal Rate  Volume:  Normal  Mood:  Euthymic  Affect:  Congruent  Thought Process:  Goal Directed and Descriptions of Associations: Circumstantial  Orientation:  Full (Time, Place, and Person)  Thought Content: Logical   Suicidal Thoughts:  No  Homicidal Thoughts:  No  Memory:   at her baseline  Judgement:  Good  Insight:  Good  Psychomotor Activity:  Normal  Concentration:  Concentration:  Good and Attention Span: Good  Recall:  Good  Fund of Knowledge: Good  Language: Good  Assets:  Communication Skills Desire for Improvement Financial Resources/Insurance Housing Resilience Transportation   ADL's:  Intact  Cognition: WNL  Prognosis:  Good   DIAGNOSES:    ICD-10-CM   1. Generalized anxiety disorder  F41.1     2. Insomnia, unspecified type  G47.00     3. Restless legs syndrome (RLS)  G25.81     4. Chronic prescription benzodiazepine use  Z79.899       Receiving Psychotherapy: No   RECOMMENDATIONS:  PDMP reviewed.  Xanax  filled 07/27/2023. I provided 25 minutes of face to face time during this encounter, including time spent before and after the visit in records review, medical decision making, counseling pertinent to today's visit, and charting.   I am glad to see her doing much better!  No changes in treatment at this time.  Continue Xanax  0.5 mg, 1  p.o. twice daily as needed.  DON'T FILL EARLY. EVER.   Continue pramipexole  1 mg, 1 p.o. nightly. Continue ropinirole  1 mg, 1 p.o. twice daily. Continue Zoloft  100 mg p.o. daily. Continue trazodone  50 mg, up to 2 p.o. nightly as needed sleep. Strongly recommend counseling. Return in 2 months.  Verneita Cooks, PA-C

## 2023-08-16 ENCOUNTER — Encounter: Payer: Self-pay | Admitting: Oncology

## 2023-08-16 ENCOUNTER — Encounter: Payer: Self-pay | Admitting: Physician Assistant

## 2023-08-16 ENCOUNTER — Other Ambulatory Visit: Payer: Self-pay | Admitting: Physician Assistant

## 2023-08-16 ENCOUNTER — Ambulatory Visit: Payer: Medicare Other

## 2023-08-16 ENCOUNTER — Encounter: Payer: Self-pay | Admitting: Gastroenterology

## 2023-08-16 ENCOUNTER — Ambulatory Visit
Admission: RE | Admit: 2023-08-16 | Discharge: 2023-08-16 | Disposition: A | Discharge status: Home / Self Care | Payer: Medicare Other | Source: Ambulatory Visit | Attending: Physician Assistant | Admitting: Physician Assistant

## 2023-08-16 DIAGNOSIS — Z7689 Persons encountering health services in other specified circumstances: Secondary | ICD-10-CM

## 2023-08-16 DIAGNOSIS — F4489 Other dissociative and conversion disorders: Secondary | ICD-10-CM

## 2023-08-16 DIAGNOSIS — M542 Cervicalgia: Secondary | ICD-10-CM

## 2023-08-16 DIAGNOSIS — R6883 Chills (without fever): Secondary | ICD-10-CM

## 2023-08-16 DIAGNOSIS — R4781 Slurred speech: Secondary | ICD-10-CM

## 2023-08-16 DIAGNOSIS — M436 Torticollis: Secondary | ICD-10-CM

## 2023-08-16 DIAGNOSIS — R29818 Other symptoms and signs involving the nervous system: Secondary | ICD-10-CM

## 2023-08-16 DIAGNOSIS — R519 Headache, unspecified: Secondary | ICD-10-CM

## 2023-08-19 ENCOUNTER — Telehealth: Payer: Self-pay

## 2023-08-19 ENCOUNTER — Ambulatory Visit: Payer: Medicare Other | Admitting: Urology

## 2023-08-19 DIAGNOSIS — N390 Urinary tract infection, site not specified: Secondary | ICD-10-CM

## 2023-08-19 MED ORDER — DOXYCYCLINE HYCLATE 100 MG PO CAPS: 100.0000 mg | ORAL_CAPSULE | Freq: Two times a day (BID) | ORAL | 0 refills | Status: AC

## 2023-08-19 NOTE — Telephone Encounter (Signed)
Pt seen at Neurology [facility]- on 1/15.  Pt was having UTI sx.   + U/C  Final report Abnormal    Result 1 - [facility] Klebsiella aerogenes Abnormal   Multiple allergies.   Pt called neurology to get treated and they stated they do not treat UTI.   [facility] or [facility] [address]  Pls advise.

## 2023-08-19 NOTE — Telephone Encounter (Signed)
Doxy 100mg  BID x7 days sent to [facility] in [place]

## 2023-08-19 NOTE — Telephone Encounter (Signed)
Pt aware.

## 2023-08-20 ENCOUNTER — Encounter: Payer: Self-pay | Admitting: *Deleted

## 2023-08-22 ENCOUNTER — Telehealth: Payer: Self-pay

## 2023-08-22 ENCOUNTER — Ambulatory Visit: Payer: Self-pay

## 2023-08-22 NOTE — Telephone Encounter (Signed)
Called pt, LVM for pt to return call

## 2023-08-22 NOTE — Telephone Encounter (Signed)
Pt wanted SV to know the following....  On 1/20 SV rxed Doxycycline 100mg  bid x 7 for UTI.-   Klebsiella aerogenes Abnormal     Pt took 2 pills on 1/20 in the pm. She is taking med as rxed.   This morning she woke up and noticed yellow in the white of her eyes.   Swollen eyelids x 7 days.  Swollen ankles - ongoing. On lasix per cardiologist. (CHF) Itching all over- ongoing No SOB.   Lower back pain- since dx with UTI She has an appt with her pcp tomorrow at 10am. She states if she feels worse she may go to the ED.  She does not think these sx are bc of the ATB. She wanted SV to know what was going on with her.   Will send to SV.

## 2023-08-22 NOTE — Telephone Encounter (Signed)
Yellow in the whites of her eyes is concerning for jaundice. She needs to go to the ED for further evaluation.

## 2023-08-22 NOTE — Telephone Encounter (Signed)
LM informing pt.   Called husband cell n/a.

## 2023-08-22 NOTE — Telephone Encounter (Signed)
Tried contacting pt again, LVM for pt to return call.

## 2023-08-22 NOTE — Telephone Encounter (Signed)
Called pt husband, LVM for him to return call.

## 2023-08-22 NOTE — Telephone Encounter (Signed)
  Chief Complaint: eye swelling Symptoms: eyelid swelling, yellowing of white part of eyes, LE edema Frequency: last week  Pertinent Negatives: Patient denies redness or drainage to eyes  Disposition: [] ED /[] Urgent Care (no appt availability in office) / [x] Appointment(In office/virtual)/ []  [facility]/ [] Home Care/ [] Refused Recommended Disposition /[] [facility]/ []  Follow-up with PCP Additional Notes: pt states she is sick and has been dealing with UTI and is currently on 2nd round of abx. Pt states she is unsure about some other sx she has going on and would like to be seen. Advised of appt for tomorrow at 1000 with PCP. Scheduled OV. Advised if sx get worse or pt feels she needs to go to ED to please call back to cancel appt. Pt verbalized understanding.    Reason for Disposition  [1] MILD eyelid swelling (puffiness) AND [2] persists > 3 days  (Exception: Suspect mosquito bites.)  Answer Assessment - Initial Assessment Questions 1. ONSET: "When did the swelling start?" (e.g., minutes, hours, days)     Last week  2. LOCATION: "What part of the eyelids is swollen?"     Upper eyelids  3. SEVERITY: "How swollen is it?"     Mild to moderate  4. ITCHING: "Is there any itching?" If Yes, ask: "How much?"   (Scale 1-10; mild, moderate or severe)     no 5. PAIN: "Is the swelling painful to touch?" If Yes, ask: "How painful is it?"   (Scale 1-10; mild, moderate or severe)     no 9. OTHER SYMPTOMS: "Do you have any other symptoms?" (e.g., blurred vision, eye discharge, rash, runny nose)     Yellowing to white part of eye, LE swelling  Protocols used: Eye - Swelling-A-AH

## 2023-08-23 ENCOUNTER — Ambulatory Visit (INDEPENDENT_AMBULATORY_CARE_PROVIDER_SITE_OTHER): Payer: Medicare Other | Admitting: Nurse Practitioner

## 2023-08-23 ENCOUNTER — Ambulatory Visit
Admission: RE | Admit: 2023-08-23 | Discharge: 2023-08-23 | Disposition: A | Discharge status: Home / Self Care | Payer: Medicare Other | Source: Ambulatory Visit | Attending: Physician Assistant | Admitting: Physician Assistant

## 2023-08-23 ENCOUNTER — Encounter: Payer: Self-pay | Admitting: Nurse Practitioner

## 2023-08-23 VITALS — BP 119/78 | HR 85 | Temp 98.2°F | Ht 65.0 in | Wt 117.0 lb

## 2023-08-23 DIAGNOSIS — M542 Cervicalgia: Secondary | ICD-10-CM

## 2023-08-23 DIAGNOSIS — R748 Abnormal levels of other serum enzymes: Secondary | ICD-10-CM

## 2023-08-23 DIAGNOSIS — R4781 Slurred speech: Secondary | ICD-10-CM

## 2023-08-23 DIAGNOSIS — M436 Torticollis: Secondary | ICD-10-CM

## 2023-08-23 DIAGNOSIS — R29818 Other symptoms and signs involving the nervous system: Secondary | ICD-10-CM

## 2023-08-23 DIAGNOSIS — R6883 Chills (without fever): Secondary | ICD-10-CM

## 2023-08-23 DIAGNOSIS — R519 Headache, unspecified: Secondary | ICD-10-CM

## 2023-08-23 DIAGNOSIS — R946 Abnormal results of thyroid function studies: Secondary | ICD-10-CM | POA: Diagnosis not present

## 2023-08-23 DIAGNOSIS — F4489 Other dissociative and conversion disorders: Secondary | ICD-10-CM

## 2023-08-23 DIAGNOSIS — F419 Anxiety disorder, unspecified: Secondary | ICD-10-CM

## 2023-08-23 MED ORDER — GADOPICLENOL 0.5 MMOL/ML IV SOLN
5.0000 mL | Freq: Once | INTRAVENOUS | Status: AC | PRN
  Administered 2023-08-23: 5 mL via INTRAVENOUS

## 2023-08-23 NOTE — Assessment & Plan Note (Addendum)
Chronic. Not well controlled.  Suspect many of patient's symptoms are from uncontrolled anxiety.  She has recently been taken off Seroquel due to elevated liver enzymes and put on Trazodone.  Anxiety has continued to worsen over the last year since weaning won on her Xanax.  Suspect patient's pain symptoms are related to anxiety.  Recommend patient follow up with psychiatrist for reevaluation and medication adjustment.

## 2023-08-23 NOTE — Telephone Encounter (Signed)
Patient returned calls from yesterday (1/23), she had fallen asleep and didn't get messages until this morning. She wanted to let office know tht she has an appointment with her primary doctor this morning.

## 2023-08-23 NOTE — Progress Notes (Unsigned)
BP 119/78   Pulse 85   Temp 98.2 F (36.8 C)   Ht 5\' 5"  (1.651 m)   Wt 117 lb (53.1 kg)   LMP  (LMP Unknown)   BMI 19.47 kg/m    Subjective:    Patient ID: Amy Hansen, female    DOB: 08/28/1955, 68 y.o.   MRN: [id number]  HPI: Amy Hansen is a 68 y.o. female  Chief Complaint  Patient presents with   Eye Problem    Bilateral, present a couple weeks   Medical Management of Chronic Issues   Pain   Patient states she was taken off seroquel due to elevated liver enzymes. She is on Trazodone.  States she is lucky if she gets 3 hours of sleep a night.  She goes days where she is only getting 3 hours of sleep a night.  This is managed by Melony Overly.    Patient states the pain in her neck started about 2 weeks ago.  She feels swollen in her neck.  She saw Neurology who wanted her to be seen in the ED but patient declined.  CT of the head was ordered and patient has an MRI today.      Relevant past medical, surgical, family and social history reviewed and updated as indicated. Interim medical history since our last visit reviewed. Allergies and medications reviewed and updated.  Review of Systems  Musculoskeletal:  Positive for neck pain.  Psychiatric/Behavioral:  Positive for dysphoric mood and sleep disturbance. The patient is nervous/anxious.     Per HPI unless specifically indicated above     Objective:    BP 119/78   Pulse 85   Temp 98.2 F (36.8 C)   Ht 5\' 5"  (1.651 m)   Wt 117 lb (53.1 kg)   LMP  (LMP Unknown)   BMI 19.47 kg/m   Wt Readings from Last 3 Encounters:  08/24/23 116 lb (52.6 kg)  08/23/23 117 lb (53.1 kg)  08/06/23 119 lb (54 kg)    Physical Exam Vitals and nursing note reviewed.  Constitutional:      General: She is not in acute distress.    Appearance: Normal appearance. She is normal weight. She is not ill-appearing, toxic-appearing or diaphoretic.  HENT:     Head: Normocephalic.     Right Ear: External ear normal.     Left Ear:  External ear normal.     Nose: Nose normal.     Mouth/Throat:     Mouth: Mucous membranes are moist.     Pharynx: Oropharynx is clear.  Eyes:     General:        Right eye: No discharge.        Left eye: No discharge.     Extraocular Movements: Extraocular movements intact.     Conjunctiva/sclera: Conjunctivae normal.     Pupils: Pupils are equal, round, and reactive to light.  Neck:     Comments: Pain with extremely light palpation of any part of the neck. No swelling observed on exam. Cardiovascular:     Rate and Rhythm: Normal rate and regular rhythm.     Heart sounds: No murmur heard. Pulmonary:     Effort: Pulmonary effort is normal. No respiratory distress.     Breath sounds: Normal breath sounds. No wheezing or rales.  Musculoskeletal:     Cervical back: Normal range of motion and neck supple.  Skin:    General: Skin is warm and dry.  Capillary Refill: Capillary refill takes less than 2 seconds.  Neurological:     General: No focal deficit present.     Mental Status: She is alert and oriented to person, place, and time. Mental status is at baseline.  Psychiatric:        Mood and Affect: Mood normal.        Behavior: Behavior normal.        Thought Content: Thought content normal.        Judgment: Judgment normal.     Results for orders placed or performed in visit on 08/23/23  Thyroid Panel With TSH   Collection Time: 08/23/23 10:29 AM  Result Value Ref Range   TSH 1.430 0.450 - 4.500 uIU/mL   T4, Total 7.9 4.5 - 12.0 ug/dL   T3 Uptake Ratio 29 24 - 39 %   Free Thyroxine Index 2.3 1.2 - 4.9  Thyroid peroxidase antibody   Collection Time: 08/23/23 10:29 AM  Result Value Ref Range   Thyroperoxidase Ab SerPl-aCnc 24 0 - 34 IU/mL  Hepatic function panel   Collection Time: 08/23/23 10:29 AM  Result Value Ref Range   Total Protein 6.6 6.0 - 8.5 g/dL   Albumin 4.0 3.9 - 4.9 g/dL   Bilirubin Total 0.2 0.0 - 1.2 mg/dL   Bilirubin, Direct 0.10 0.00 - 0.40 mg/dL    Alkaline Phosphatase 243 (H) 44 - 121 IU/L   AST 47 (H) 0 - 40 IU/L   ALT 113 (H) 0 - 32 IU/L  CBC w/Diff   Collection Time: 08/23/23 10:29 AM  Result Value Ref Range   WBC 4.8 3.4 - 10.8 x10E3/uL   RBC 4.34 3.77 - 5.28 x10E6/uL   Hemoglobin 12.1 11.1 - 15.9 g/dL   Hematocrit 27.2 53.6 - 46.6 %   MCV 88 79 - 97 fL   MCH 27.9 26.6 - 33.0 pg   MCHC 31.8 31.5 - 35.7 g/dL   RDW 64.4 03.4 - 74.2 %   Platelets 292 150 - 450 x10E3/uL   Neutrophils 62 Not Estab. %   Lymphs 29 Not Estab. %   Monocytes 8 Not Estab. %   Eos 1 Not Estab. %   Basos 0 Not Estab. %   Neutrophils Absolute 3.0 1.4 - 7.0 x10E3/uL   Lymphocytes Absolute 1.4 0.7 - 3.1 x10E3/uL   Monocytes Absolute 0.4 0.1 - 0.9 x10E3/uL   EOS (ABSOLUTE) 0.0 0.0 - 0.4 x10E3/uL   Basophils Absolute 0.0 0.0 - 0.2 x10E3/uL   Immature Granulocytes 0 Not Estab. %   Immature Grans (Abs) 0.0 0.0 - 0.1 x10E3/uL   *Note: Due to a large number of results and/or encounters for the requested time period, some results have not been displayed. A complete set of results can be found in Results Review.      Assessment & Plan:   Problem List Items Addressed This Visit       Other   Anxiety disorder (Chronic)   Chronic. Not well controlled.  Suspect many of patient's symptoms are from uncontrolled anxiety.  She has recently been taken off Seroquel due to elevated liver enzymes and put on Trazodone.  Anxiety has continued to worsen over the last year since weaning won on her Xanax.  Suspect patient's pain symptoms are related to anxiety.  Recommend patient follow up with psychiatrist for reevaluation and medication adjustment.      Other Visit Diagnoses       Thyroid function test abnormal    -  Primary   Labs rechecked at visit today.   Relevant Orders   Thyroid Panel With TSH (Completed)   Thyroid peroxidase antibody (Completed)   CBC w/Diff (Completed)     Elevated liver enzymes       Repeated at visit today.  Will send to GI once  resulted.   Relevant Orders   Hepatic function panel (Completed)   CBC w/Diff (Completed)        Follow up plan: No follow-ups on file.

## 2023-08-24 ENCOUNTER — Emergency Department: Payer: Medicare Other

## 2023-08-24 ENCOUNTER — Other Ambulatory Visit: Payer: Self-pay

## 2023-08-24 ENCOUNTER — Emergency Department
Admission: EM | Admit: 2023-08-24 | Discharge: 2023-08-24 | Disposition: A | Discharge status: Home / Self Care | Payer: Medicare Other | Attending: Emergency Medicine | Admitting: Emergency Medicine

## 2023-08-24 DIAGNOSIS — R209 Unspecified disturbances of skin sensation: Secondary | ICD-10-CM | POA: Diagnosis not present

## 2023-08-24 DIAGNOSIS — R531 Weakness: Secondary | ICD-10-CM | POA: Diagnosis present

## 2023-08-24 DIAGNOSIS — R079 Chest pain, unspecified: Secondary | ICD-10-CM | POA: Insufficient documentation

## 2023-08-24 DIAGNOSIS — E119 Type 2 diabetes mellitus without complications: Secondary | ICD-10-CM | POA: Insufficient documentation

## 2023-08-24 DIAGNOSIS — R208 Other disturbances of skin sensation: Secondary | ICD-10-CM

## 2023-08-24 DIAGNOSIS — R0602 Shortness of breath: Secondary | ICD-10-CM | POA: Diagnosis not present

## 2023-08-24 DIAGNOSIS — R29898 Other symptoms and signs involving the musculoskeletal system: Secondary | ICD-10-CM

## 2023-08-24 LAB — CBC WITH DIFFERENTIAL/PLATELET
Abs Immature Granulocytes: 0.02 10*3/uL (ref 0.00–0.07)
Basophils Absolute: 0 10*3/uL (ref 0.0–0.2)
Basophils Absolute: 0 10*3/uL (ref 0.0–0.1)
Basophils Relative: 0 %
Basos: 0 %
EOS (ABSOLUTE): 0 10*3/uL (ref 0.0–0.4)
Eos: 1 %
Eosinophils Absolute: 0 10*3/uL (ref 0.0–0.5)
Eosinophils Relative: 1 %
HCT: 36.4 % (ref 36.0–46.0)
Hematocrit: 38.1 % (ref 34.0–46.6)
Hemoglobin: 12.1 g/dL (ref 11.1–15.9)
Hemoglobin: 11.7 g/dL — ABNORMAL LOW (ref 12.0–15.0)
Immature Grans (Abs): 0 10*3/uL (ref 0.0–0.1)
Immature Granulocytes: 0 %
Immature Granulocytes: 1 %
Lymphocytes Absolute: 1.4 10*3/uL (ref 0.7–3.1)
Lymphocytes Relative: 33 %
Lymphs Abs: 1.3 10*3/uL (ref 0.7–4.0)
Lymphs: 29 %
MCH: 27.9 pg (ref 26.6–33.0)
MCH: 28.1 pg (ref 26.0–34.0)
MCHC: 31.8 g/dL (ref 31.5–35.7)
MCHC: 32.1 g/dL (ref 30.0–36.0)
MCV: 88 fL (ref 79–97)
MCV: 87.5 fL (ref 80.0–100.0)
Monocytes Absolute: 0.4 10*3/uL (ref 0.1–0.9)
Monocytes Absolute: 0.3 10*3/uL (ref 0.1–1.0)
Monocytes Relative: 6 %
Monocytes: 8 %
Neutro Abs: 2.4 10*3/uL (ref 1.7–7.7)
Neutrophils Absolute: 3 10*3/uL (ref 1.4–7.0)
Neutrophils Relative %: 59 %
Neutrophils: 62 %
Platelets: 292 10*3/uL (ref 150–450)
Platelets: 243 10*3/uL (ref 150–400)
RBC: 4.34 x10E6/uL (ref 3.77–5.28)
RBC: 4.16 MIL/uL (ref 3.87–5.11)
RDW: 12 % (ref 11.7–15.4)
RDW: 12.7 % (ref 11.5–15.5)
WBC: 4.8 10*3/uL (ref 3.4–10.8)
WBC: 4 10*3/uL (ref 4.0–10.5)
nRBC: 0 % (ref 0.0–0.2)

## 2023-08-24 LAB — BASIC METABOLIC PANEL
Anion gap: 10 (ref 5–15)
BUN: 9 mg/dL (ref 8–23)
CO2: 23 mmol/L (ref 22–32)
Calcium: 9.2 mg/dL (ref 8.9–10.3)
Chloride: 101 mmol/L (ref 98–111)
Creatinine, Ser: 0.69 mg/dL (ref 0.44–1.00)
GFR, Estimated: >60 mL/min (ref >60)
Glucose, Bld: 396 mg/dL — ABNORMAL HIGH (ref 70–99)
Potassium: 3.6 mmol/L (ref 3.5–5.1)
Sodium: 134 mmol/L — ABNORMAL LOW (ref 135–145)

## 2023-08-24 LAB — THYROID PANEL WITH TSH
Free Thyroxine Index: 2.3 (ref 1.2–4.9)
T3 Uptake Ratio: 29 % (ref 24–39)
T4, Total: 7.9 ug/dL (ref 4.5–12.0)
TSH: 1.43 u[IU]/mL (ref 0.450–4.500)

## 2023-08-24 LAB — BRAIN NATRIURETIC PEPTIDE: B Natriuretic Peptide: 28.9 pg/mL (ref 0.0–100.0)

## 2023-08-24 LAB — HEPATIC FUNCTION PANEL
ALT: 113 [IU]/L — ABNORMAL HIGH (ref 0–32)
AST: 47 [IU]/L — ABNORMAL HIGH (ref 0–40)
Albumin: 4 g/dL (ref 3.9–4.9)
Alkaline Phosphatase: 243 [IU]/L — ABNORMAL HIGH (ref 44–121)
Bilirubin Total: 0.2 mg/dL (ref 0.0–1.2)
Bilirubin, Direct: 0.12 mg/dL (ref 0.00–0.40)
Total Protein: 6.6 g/dL (ref 6.0–8.5)

## 2023-08-24 LAB — TROPONIN I (HIGH SENSITIVITY)
Troponin I (High Sensitivity): 4 ng/L (ref <18)
Troponin I (High Sensitivity): 4 ng/L (ref <18)

## 2023-08-24 LAB — THYROID PEROXIDASE ANTIBODY: Thyroperoxidase Ab SerPl-aCnc: 24 [IU]/mL (ref 0–34)

## 2023-08-24 NOTE — ED Provider Triage Note (Signed)
Emergency Medicine Provider Triage Evaluation Note  Amy Hansen , a 68 y.o. female  was evaluated in triage.  Pt complains of CP that began yesterday. Improved with 2 nitro. CP began again today while walking into the kitchen, didn't take nitroglycerin and resolved. +SOB. +nausea. Reports anxiety.  Review of Systems  Positive: CP, SOB Negative: Abd pain, back pain  Physical Exam  LMP  (LMP Unknown)  Gen:   Awake, no distress   Resp:  Normal effort  MSK:   Moves extremities without difficulty  Other:    Medical Decision Making  Medically screening exam initiated at 1:33 PM.  Appropriate orders placed.  Amy Hansen was informed that the remainder of the evaluation will be completed by another provider, this initial triage assessment does not replace that evaluation, and the importance of remaining in the ED until their evaluation is complete.     Jackelyn Hoehn, PA-C 08/24/23 1334

## 2023-08-24 NOTE — ED Provider Notes (Signed)
[facility] Provider Note    Event Date/Time   First MD Initiated Contact with Patient 08/24/23 1721     (approximate)   History   No chief complaint on file.   HPI  Amy Hansen is a 68 y.o. female history of anxiety, prior CVA, diabetes, ulcerative colitis, presenting with chest pain yesterday that was associated with shortness of breath.  She denies any nausea, vomiting.  No fevers, cough.  States that she was sent in by her neurologist because she has been having about a week of left-sided decreased sensation, also feels some shooting pain all over her body but worse is shooting pain down her left lower extremity with associated tingling and also feels like her left lower extremity is also little weak.  The symptoms have been ongoing for more than a week.  She is still ambulatory.  Also states that she has some bilateral neck aching particularly along the bilateral trapezius region.  States that she felt very anxious about all the symptoms never having, had some chest pain yesterday that was associated with shortness of breath that she thinks was due to anxiety.  Took some nitroglycerin and felt better after that.  She denies any chest pain or shortness of breath now.  Called her neurologist to discuss the results of her MRI brain as well as cervical spine and was told to come to the emergency department for examination.  Is also currently on doxycycline for UTI.  States that she is sometimes incontinent of stool but that is consistent for her from her ulcerative colitis, she denies any saddle anesthesia or incontinence.  Independent review of the neurology telephone call and MRI results, MRI brain with and without IV contrast noted no acute intracranial process, no evidence of acute or subacute stroke.  Did note some fluid in the right mastoid air cells but patient has no mastoid tenderness or erythema.  MRI of the cervical spine noted no cord compression, did note  some bulging disks at 3-4, 5-6, 6 7.     Physical Exam   Triage Vital Signs: ED Triage Vitals  Encounter Vitals Group     BP 08/24/23 1339 100/63     Systolic BP Percentile --      Diastolic BP Percentile --      Pulse Rate 08/24/23 1339 83     Resp 08/24/23 1339 18     Temp 08/24/23 1339 98.4 F (36.9 C)     Temp Source 08/24/23 1339 Oral     SpO2 08/24/23 1339 94 %     Weight 08/24/23 1334 116 lb (52.6 kg)     Height 08/24/23 1334 5' (1.524 m)     Head Circumference --      Peak Flow --      Pain Score 08/24/23 1333 6     Pain Loc --      Pain Education --      Exclude from Growth Chart --     Most recent vital signs: Vitals:   08/24/23 1339 08/24/23 1749  BP: 100/63 119/71  Pulse: 83 91  Resp: 18 18  Temp: 98.4 F (36.9 C) 98.1 F (36.7 C)  SpO2: 94% 95%     General: Awake, no distress.  CV:  Good peripheral perfusion.  Resp:  Normal effort.   Abd:  No distention.  Soft nontender Other:  She has decreased sensation to her left upper and lower face, left upper extremity, left lower  extremity, she has mild weakness to the left lower extremity.  She has good and equal distal pulses.  Upper extremity strength is intact and equal bilaterally.  No dysmetria.  Pupils are equal and reactive, extraocular movements are intact, she has known mastoid tenderness or erythema.  Her TMs are clear bilaterally.  She has no midline spinal tenderness, no saddle anesthesia.  She does have tenderness over her bilateral trapezius.   ED Results / Procedures / Treatments   Labs (all labs ordered are listed, but only abnormal results are displayed) Labs Reviewed  BASIC METABOLIC PANEL - Abnormal; Notable for the following components:      Result Value   Sodium 134 (*)    Glucose, Bld 396 (*)    All other components within normal limits  CBC WITH DIFFERENTIAL/PLATELET - Abnormal; Notable for the following components:   Hemoglobin 11.7 (*)    All other components within normal  limits  BRAIN NATRIURETIC PEPTIDE  TROPONIN I (HIGH SENSITIVITY)  TROPONIN I (HIGH SENSITIVITY)     EKG  Sinus rhythm, reassigning 4, normal QS, normal QTc, no ischemic ST elevation, no obvious T wave changes, not significant change compared to prior   RADIOLOGY Chest x-ray my interpretation without focal consolidation   PROCEDURES:  Critical Care performed: No  Procedures   MEDICATIONS ORDERED IN ED: Medications - No data to display   IMPRESSION / MDM / ASSESSMENT AND PLAN / ED COURSE  I reviewed the triage vital signs and the nursing notes.                              Differential diagnosis includes, but is not limited to, considered CVA, TIA, patient has no findings on MRI done yesterday, also considered radiculopathy, peripheral neuropathy, no evidence of cauda equina or conus medullaris at this time.  Suspect that she has some muscle strain or not to her bilateral trapezius.  Will get labs, EKG, troponin, chest x-ray.  Patient's presentation is most consistent with acute presentation with potential threat to life or bodily function.  Independent review of labs, troponin x 2 is negative, electrolytes not severely deranged, no leukocytosis, BNP is not elevated.  Shared decision making done with patient and offered MRI to look at lumbar spine but patient would rather follow-up with her neurologist and get this done outpatient.  Considered but no indication for inpatient mission at this time, she safer outpatient management.  She return precautions given.  Will discharge.      FINAL CLINICAL IMPRESSION(S) / ED DIAGNOSES   Final diagnoses:  Decreased sensation  Weakness of left lower extremity  Chest pain, unspecified type     Rx / DC Orders   ED Discharge Orders     None        Note:  This document was prepared using Dragon voice recognition software and may include unintentional dictation errors.    Claybon Jabs, MD 08/24/23 [phone]

## 2023-08-24 NOTE — Discharge Instructions (Signed)
Please follow-up with your neurologist for further management of your symptoms.  You might also need a referral to the spine doctor for further management of your bulging disks.  Please return if you have any numbness around your groin, if you have new neurological symptoms, if you are incontinent of urine, chest pain, difficulty breathing, or have any additional concerns.

## 2023-08-24 NOTE — ED Triage Notes (Signed)
Pt presents to ER from home with chest pain, reports she was walking from kitchen to bedroom. Feels short o f breath. Pt reports she developed chest pain yesterday and took 2 nitroglycerin reports second nitroglycerin helped. Pt also reports her MD called and referred her to ER due to MRI result to be seen in the hospital.  Pt talks in complete sentences no distress noted. Pt reports she is taking Doxycycline for UTI

## 2023-08-26 ENCOUNTER — Telehealth: Payer: Self-pay | Admitting: Gastroenterology

## 2023-08-26 DIAGNOSIS — R197 Diarrhea, unspecified: Secondary | ICD-10-CM

## 2023-08-26 DIAGNOSIS — K523 Indeterminate colitis: Secondary | ICD-10-CM

## 2023-08-26 NOTE — Telephone Encounter (Signed)
Reviewed DOD -Lets get stool for C. Difficile, GI pathogens (since she has been on antibiotics) -Please check on Entyvio and start ASAP -I have reviewed labs.  She had abnormal LFTs with negative CT/MRCP in past.  Can you please call [facility] and see if they openings for earlier appointment. -For now she can try OTC Imodium A-D 1 tab TID. -Recommendations on Dr. Lanetta Inch return. RG

## 2023-08-26 NOTE — Telephone Encounter (Signed)
Dr. Chales Abrahams as DOD PM of 08/26/23 - please see note below and advise. Thanks  Dr. Lanetta Inch patient with a history of indeterminate colitis. Patient's symptoms were controlled on Entyvio every 4 weeks. Pt informed me that her last infusion was 06/07/23, not sure why she hasn't received any more infusions. I am checking with the infusion center to see if it was a coverage issue. Pt reports that she has been on antibiotics the last 2 months for a recurrent UTI and thinks this started the colitis flare up. Patient reports a change in bowel habits over the last month, abdomen is tender at times, pt reports "deep pain" when moving. Pt reports mucous in the stool, but denies blood. Pt reports that she will "dump" 1-2x/day. Large volume stools, first one was a lot of stool, second BM was mostly liquid. Pt is aware that we are checking on Entyvio coverage, and she may need medication to help control her symptoms until she can be rescheduled. Patient also mentioned that she had labs drawn recently (08/23/23) and LFTs are elevated again. Results are in epic. [facility] consultation is not until March. Please advise in Dr. Lanetta Inch absence. Thanks        @[name] , pt's last Entyvio infusion was on 06/07/23. She was getting Entyvio every 4 weeks. Are you able to see why she was not scheduled after that? Patient currently has [employer] Medicare A/B, her supplement will begin on 08/31/23.  Let me know, thanks!

## 2023-08-26 NOTE — Telephone Encounter (Signed)
Patient called requesting to speak with a nurse stated she think she might be having a flare up.

## 2023-08-26 NOTE — Telephone Encounter (Signed)
MyChart message sent to patient with recommendations.  [employer] targeted order printed and placed with stool kit/instructions on 2nd floor.  Will call [facility] in the morning to discuss sooner appt for patient.  This telephone encounter has been sent to Amy Hansen to follow up on Entyvio infusions.

## 2023-08-27 ENCOUNTER — Ambulatory Visit: Payer: BC Managed Care – PPO | Admitting: Physician Assistant

## 2023-08-27 ENCOUNTER — Ambulatory Visit: Payer: Medicare Other | Admitting: Physician Assistant

## 2023-08-27 ENCOUNTER — Telehealth: Payer: Self-pay | Admitting: Pharmacy Technician

## 2023-08-27 NOTE — Telephone Encounter (Signed)
[name], Unfortunately, scheduling team is un-sure why she was not rescheduled.    She does have Medicare A/B now and will be responsible for 20%.  I have informed [facility] to enroll her in the PAP (free drug). I have instructed them to fax the forms to you for MD signature.  Thanks Selena Batten

## 2023-08-27 NOTE — Telephone Encounter (Signed)
[name],  Patient will need Entyvio PAP (free drug). [facility] will be sending the forms to fax: [phone].  Once patient is approved she will be scheduled as soon as possible.  Amy Hansen

## 2023-08-27 NOTE — Telephone Encounter (Signed)
Called and spoke with [facility], appt has been moved to 09/25/23. That is the soonest appt at this time.   Called and spoke with patient. She will come pick up stool kit and follow previous recommendations.

## 2023-08-28 ENCOUNTER — Other Ambulatory Visit: Payer: Self-pay | Admitting: Nurse Practitioner

## 2023-08-28 ENCOUNTER — Telehealth: Payer: Self-pay | Admitting: Physician Assistant

## 2023-08-28 ENCOUNTER — Other Ambulatory Visit: Payer: Self-pay

## 2023-08-28 MED ORDER — PREGABALIN 75 MG PO CAPS: ORAL_CAPSULE | ORAL | 0 refills | Status: DC

## 2023-08-28 NOTE — Telephone Encounter (Signed)
She said that she was on lyrica in the past- she said that it had her sugars sky high and her cardiologist took her off to get her sugar under control. She said she is open to trying it again. I will pend it.   Please schedule pt within the next 2 weeks.

## 2023-08-28 NOTE — Telephone Encounter (Signed)
Raven or Clarisse Gouge, Please give Amy Hansen call and let her know her PCP, [name], NP, and I have discussed her anxiety.  We agree that adding Lyrica will be helpful for anxiety as well as the pain she is having.  I would like to start her on that.  It will be 75 mg nightly for 1 week and then increase to 75 mg twice daily.  If the daytime dose is too sedating then let me know and we can change the dose or timing that she takes it.  Please Pend #[phone] with no refills. Also she needs to be seen sooner than originally planned, so after you speak with her, ask the admin staff to call her with an urgent appointment within the next 2 weeks. Thank you.

## 2023-08-29 ENCOUNTER — Encounter: Payer: Self-pay | Admitting: Gastroenterology

## 2023-08-29 ENCOUNTER — Other Ambulatory Visit: Payer: Self-pay | Admitting: Physician Assistant

## 2023-08-29 ENCOUNTER — Encounter: Payer: Self-pay | Admitting: Oncology

## 2023-08-29 NOTE — Telephone Encounter (Signed)
Requested Prescriptions  Pending Prescriptions Disp Refills   pramipexole (MIRAPEX) 1 MG tablet [Pharmacy Med Name: PRAMIPEXOLE 1 MG TABLET] 90 tablet 0    Sig: Take 1 tablet (1 mg total) by mouth at bedtime.     Neurology:  Parkinsonian Agents Passed - 08/29/2023  3:05 PM      Passed - Last BP in normal range    BP Readings from Last 1 Encounters:  08/24/23 109/73         Passed - Last Heart Rate in normal range    Pulse Readings from Last 1 Encounters:  08/24/23 83         Passed - Valid encounter within last 12 months    Recent Outpatient Visits           6 days ago Thyroid function test abnormal   [place] [facility] [name], NP   1 month ago Chest pain, unspecified type   [facility] [name], NP   1 month ago Type 2 diabetes mellitus with hyperglycemia, with long-term current use of insulin [facility])   [facility] Jackolyn Confer, MD   1 month ago Type 2 diabetes mellitus with hyperglycemia, with long-term current use of insulin [facility])   [facility] Jackolyn Confer, MD   2 months ago Chronic heart failure with preserved ejection fraction [facility])   [place] [facility] [name], NP       Future Appointments             In 1 week [name], NP [facility]   In 1 week Dunn, Raymon Mutton, PA-C [facility] [place]   In 2 weeks [name], NP  [facility]   In 5 months MacDiarmid, Lorin Picket, MD [facility]

## 2023-08-30 ENCOUNTER — Telehealth: Payer: Self-pay | Admitting: *Deleted

## 2023-08-30 ENCOUNTER — Encounter: Payer: Self-pay | Admitting: Gastroenterology

## 2023-08-30 ENCOUNTER — Encounter: Payer: Self-pay | Admitting: Oncology

## 2023-08-30 ENCOUNTER — Telehealth: Payer: Self-pay

## 2023-08-30 ENCOUNTER — Ambulatory Visit: Payer: BC Managed Care – PPO | Admitting: Nurse Practitioner

## 2023-08-30 DIAGNOSIS — I152 Hypertension secondary to endocrine disorders: Secondary | ICD-10-CM

## 2023-08-30 NOTE — Progress Notes (Signed)
Complex Care Management Note  Care Guide Note 08/30/2023 Name: ZULEMA PULASKI MRN: [id number] DOB: [birth year]  [name] is a 68 y.o. year old female who sees [name], NP for primary care. I reached out to [name] by phone today to offer complex care management services.  Ms. Sachdeva was given information about Complex Care Management services today including:   The Complex Care Management services include support from the care team which includes your Nurse Coordinator, Clinical Social Worker, or Pharmacist.  The Complex Care Management team is here to help remove barriers to the health concerns and goals most important to you. Complex Care Management services are voluntary, and the patient may decline or stop services at any time by request to their care team member.   Complex Care Management Consent Status: Patient agreed to services and verbal consent obtained.   Follow up plan:  Telephone appointment with complex care management team member scheduled for:  09/12/2023  Encounter Outcome:  Patient Scheduled  Burman Nieves, CMA, Care Guide [facility]  [facility], [facility] Guide Direct Dial: [phone]  Fax: [phone] Website: [email or web address]

## 2023-08-31 ENCOUNTER — Other Ambulatory Visit: Payer: Self-pay | Admitting: Nurse Practitioner

## 2023-09-02 ENCOUNTER — Ambulatory Visit: Payer: BC Managed Care – PPO | Admitting: Nurse Practitioner

## 2023-09-02 NOTE — Telephone Encounter (Signed)
Requested Prescriptions  Pending Prescriptions Disp Refills   rosuvastatin (CRESTOR) 5 MG tablet [Pharmacy Med Name: ROSUVASTATIN CALCIUM 5 MG TAB] 90 tablet 1    Sig: TAKE 1 TABLET DAILY     Cardiovascular:  Antilipid - Statins 2 Failed - 09/02/2023  3:26 PM      Failed - Lipid Panel in normal range within the last 12 months    Cholesterol, Total  Date Value Ref Range Status  06/18/2023 92 (L) 100 - 199 mg/dL Final   Cholesterol  Date Value Ref Range Status  01/10/2012 148 0 - 200 mg/dL Final   Cholesterol Piccolo, Waived  Date Value Ref Range Status  07/17/2016 139 <200 mg/dL Final    Comment:                            Desirable                <200                         Borderline High      200- 239                         High                     >239    Ldl Cholesterol, Calc  Date Value Ref Range Status  01/10/2012 79 0 - 100 mg/dL Final   LDL Chol Calc (NIH)  Date Value Ref Range Status  06/18/2023 27 0 - 99 mg/dL Final   HDL Cholesterol  Date Value Ref Range Status  01/10/2012 47 40 - 60 mg/dL Final   HDL  Date Value Ref Range Status  06/18/2023 47 >39 mg/dL Final   Triglycerides  Date Value Ref Range Status  06/18/2023 96 0 - 149 mg/dL Final  29/56/2130 865 0 - 200 mg/dL Final   Triglycerides Piccolo,Waived  Date Value Ref Range Status  07/17/2016 99 <150 mg/dL Final    Comment:                            Normal                   <150                         Borderline High     150 - 199                         High                200 - 499                         Very High                >499          Passed - Cr in normal range and within 360 days    Creatinine  Date Value Ref Range Status  01/10/2012 0.58 (L) 0.60 - 1.30 mg/dL Final   Creatinine, Ser  Date Value Ref Range Status  08/24/2023 0.69 0.44 - 1.00 mg/dL Final         Passed - Patient is not pregnant  Passed - Valid encounter within last 12 months    Recent Outpatient  Visits           1 week ago Thyroid function test abnormal   [facility] [name], NP   1 month ago Chest pain, unspecified type   [facility] [name], NP   1 month ago Type 2 diabetes mellitus with hyperglycemia, with long-term current use of insulin [facility])   [facility] Jackolyn Confer, MD   2 months ago Type 2 diabetes mellitus with hyperglycemia, with long-term current use of insulin [facility])   [facility] Jackolyn Confer, MD   2 months ago Chronic heart failure with preserved ejection fraction [facility])   [facility] [name], NP       Future Appointments             In 1 week [name], NP [place] [facility]   In 1 week Dunn, Raymon Mutton, PA-C [facility] [place]   In 2 weeks [name], NP  [facility]   In 5 months MacDiarmid, Lorin Picket, MD [facility]

## 2023-09-02 NOTE — Telephone Encounter (Signed)
No forms received. I have reached out to [facility] to see if forms can be re-faxed to me at [phone].

## 2023-09-03 ENCOUNTER — Encounter: Payer: Self-pay | Admitting: Oncology

## 2023-09-03 ENCOUNTER — Encounter: Payer: Self-pay | Admitting: Gastroenterology

## 2023-09-04 NOTE — Telephone Encounter (Signed)
I still have not received any forms. Can they email the forms to me instead?

## 2023-09-05 NOTE — Telephone Encounter (Signed)
 Noted, thanks!

## 2023-09-05 NOTE — Telephone Encounter (Signed)
 [name], I spoke with [employer] and the paperwork is being processed.  If we need anything else I will reach out to you.

## 2023-09-07 ENCOUNTER — Encounter: Payer: Self-pay | Admitting: Gastroenterology

## 2023-09-07 ENCOUNTER — Encounter: Payer: Self-pay | Admitting: Oncology

## 2023-09-09 ENCOUNTER — Encounter: Payer: Self-pay | Admitting: Nurse Practitioner

## 2023-09-09 ENCOUNTER — Ambulatory Visit (INDEPENDENT_AMBULATORY_CARE_PROVIDER_SITE_OTHER): Payer: Medicare Other

## 2023-09-09 ENCOUNTER — Ambulatory Visit (INDEPENDENT_AMBULATORY_CARE_PROVIDER_SITE_OTHER): Payer: Medicare Other | Admitting: Nurse Practitioner

## 2023-09-09 VITALS — BP 104/67 | HR 80 | Ht 60.0 in | Wt 121.8 lb

## 2023-09-09 VITALS — BP 122/73 | HR 77 | Temp 98.0°F | Resp 16 | Ht 60.0 in | Wt 123.0 lb

## 2023-09-09 DIAGNOSIS — K5100 Ulcerative (chronic) pancolitis without complications: Secondary | ICD-10-CM | POA: Diagnosis not present

## 2023-09-09 DIAGNOSIS — F419 Anxiety disorder, unspecified: Secondary | ICD-10-CM

## 2023-09-09 MED ORDER — VEDOLIZUMAB 300 MG IV SOLR
300.0000 mg | Freq: Once | INTRAVENOUS | Status: AC
  Administered 2023-09-09: 300 mg via INTRAVENOUS
  Filled 2023-09-09: qty 5

## 2023-09-09 NOTE — Progress Notes (Signed)
Diagnosis: Pancolitis  Provider:  Chilton Greathouse MD  Procedure: IV Infusion  IV Type: Peripheral, IV Location: L Antecubital  Entyvio (Vedolizumab), Dose: 300 mg  Infusion Start Time: 1318  Infusion Stop Time: 1351  Post Infusion IV Care: Patient declined observation and Peripheral IV Discontinued  Discharge: Condition: Good, Destination: Home . AVS Provided  Performed by:  Wyvonne Lenz, RN

## 2023-09-09 NOTE — Assessment & Plan Note (Addendum)
 Chronic.  Improved some from last visit with being on Lyrica .  She agrees that symptoms are related to her anxiety but needs something else to help other than Lyrica .  Discussed moving up March appt with her psychiatrist.  Patient agrees and plans to call to get scheduled sooner.

## 2023-09-09 NOTE — Progress Notes (Signed)
 BP 104/67 (BP Location: Left Arm, Patient Position: Sitting, Cuff Size: Normal)   Pulse 80   Ht 5' (1.524 m)   Wt 121 lb 12.8 oz (55.2 kg)   LMP  (LMP Unknown)   SpO2 100%   BMI 23.79 kg/m    Subjective:    Patient ID: [name], female    DOB: 07-26-1956, 68 y.o.   MRN: [id number]  HPI: IDELLA BISWELL is a 68 y.o. female  Chief Complaint  Patient presents with   1 month follow up   Foot Swelling    Cardiologist suggested that she stop the lyrica    Medication Management    Would like to know if there is another alternative to Lyrica     Patient states she was started on Lyrica  but states her cardiologist wanted her to stop it due to leg swelling.  States she was also having hallucinations on the medication.  Her neck pain has improved.  She does feel like it may be related to being on her phone so much.  She does feel like the Lyrica  helped with her anxiety.     Relevant past medical, surgical, family and social history reviewed and updated as indicated. Interim medical history since our last visit reviewed. Allergies and medications reviewed and updated.  Review of Systems  Musculoskeletal:  Positive for neck pain.  Psychiatric/Behavioral:  The patient is nervous/anxious.     Per HPI unless specifically indicated above     Objective:    BP 104/67 (BP Location: Left Arm, Patient Position: Sitting, Cuff Size: Normal)   Pulse 80   Ht 5' (1.524 m)   Wt 121 lb 12.8 oz (55.2 kg)   LMP  (LMP Unknown)   SpO2 100%   BMI 23.79 kg/m   Wt Readings from Last 3 Encounters:  09/09/23 121 lb 12.8 oz (55.2 kg)  08/24/23 116 lb (52.6 kg)  08/23/23 117 lb (53.1 kg)    Physical Exam Vitals and nursing note reviewed.  Constitutional:      General: She is not in acute distress.    Appearance: Normal appearance. She is normal weight. She is not ill-appearing, toxic-appearing or diaphoretic.  HENT:     Head: Normocephalic.     Right Ear: External ear normal.     Left Ear:  External ear normal.     Nose: Nose normal.     Mouth/Throat:     Mouth: Mucous membranes are moist.     Pharynx: Oropharynx is clear.  Eyes:     General:        Right eye: No discharge.        Left eye: No discharge.     Extraocular Movements: Extraocular movements intact.     Conjunctiva/sclera: Conjunctivae normal.     Pupils: Pupils are equal, round, and reactive to light.  Cardiovascular:     Rate and Rhythm: Normal rate and regular rhythm.     Heart sounds: No murmur heard. Pulmonary:     Effort: Pulmonary effort is normal. No respiratory distress.     Breath sounds: Normal breath sounds. No wheezing or rales.  Musculoskeletal:     Cervical back: Normal range of motion and neck supple.  Skin:    General: Skin is warm and dry.     Capillary Refill: Capillary refill takes less than 2 seconds.  Neurological:     General: No focal deficit present.     Mental Status: She is alert and oriented to person,  place, and time. Mental status is at baseline.  Psychiatric:        Mood and Affect: Mood normal.        Behavior: Behavior normal.        Thought Content: Thought content normal.        Judgment: Judgment normal.     Results for orders placed or performed during the hospital encounter of 08/24/23  Basic metabolic panel   Collection Time: 08/24/23  1:33 PM  Result Value Ref Range   Sodium 134 (L) 135 - 145 mmol/L   Potassium 3.6 3.5 - 5.1 mmol/L   Chloride 101 98 - 111 mmol/L   CO2 23 22 - 32 mmol/L   Glucose, Bld 396 (H) 70 - 99 mg/dL   BUN 9 8 - 23 mg/dL   Creatinine, Ser 2.95 0.44 - 1.00 mg/dL   Calcium  9.2 8.9 - 10.3 mg/dL   GFR, Estimated >62 >13 mL/min   Anion gap 10 5 - 15  CBC with Differential   Collection Time: 08/24/23  1:33 PM  Result Value Ref Range   WBC 4.0 4.0 - 10.5 K/uL   RBC 4.16 3.87 - 5.11 MIL/uL   Hemoglobin 11.7 (L) 12.0 - 15.0 g/dL   HCT 08.6 57.8 - 46.9 %   MCV 87.5 80.0 - 100.0 fL   MCH 28.1 26.0 - 34.0 pg   MCHC 32.1 30.0 - 36.0 g/dL    RDW 62.9 52.8 - 41.3 %   Platelets 243 150 - 400 K/uL   nRBC 0.0 0.0 - 0.2 %   Neutrophils Relative % 59 %   Neutro Abs 2.4 1.7 - 7.7 K/uL   Lymphocytes Relative 33 %   Lymphs Abs 1.3 0.7 - 4.0 K/uL   Monocytes Relative 6 %   Monocytes Absolute 0.3 0.1 - 1.0 K/uL   Eosinophils Relative 1 %   Eosinophils Absolute 0.0 0.0 - 0.5 K/uL   Basophils Relative 0 %   Basophils Absolute 0.0 0.0 - 0.1 K/uL   Immature Granulocytes 1 %   Abs Immature Granulocytes 0.02 0.00 - 0.07 K/uL  Brain natriuretic peptide   Collection Time: 08/24/23  1:33 PM  Result Value Ref Range   B Natriuretic Peptide 28.9 0.0 - 100.0 pg/mL  Troponin I (High Sensitivity)   Collection Time: 08/24/23  1:33 PM  Result Value Ref Range   Troponin I (High Sensitivity) 4 <18 ng/L  Troponin I (High Sensitivity)   Collection Time: 08/24/23  4:23 PM  Result Value Ref Range   Troponin I (High Sensitivity) 4 <18 ng/L   *Note: Due to a large number of results and/or encounters for the requested time period, some results have not been displayed. A complete set of results can be found in Results Review.      Assessment & Plan:   Problem List Items Addressed This Visit       Other   Anxiety disorder - Primary (Chronic)   Chronic.  Improved some from last visit with being on Lyrica .  She agrees that symptoms are related to her anxiety but needs something else to help other than Lyrica .  Discussed moving up March appt with her psychiatrist.  Patient agrees and plans to call to get scheduled sooner.        Follow up plan: Return in about 3 months (around 12/07/2023) for HTN, HLD, DM2 FU.

## 2023-09-10 NOTE — Progress Notes (Deleted)
 Cardiology Office Note    Date:  09/10/2023   ID:  Amy Hansen, DOB November 08, 1955, MRN [id number]  PCP:  [name], NP  Cardiologist:  Yvonne Kendall, MD  Electrophysiologist:  None   Chief Complaint: Follow-up  History of Present Illness:   Amy Hansen is a 68 y.o. female with history of CAD status post recent PCI/DES to OM1 in 04/2023, HFpEF, PSVT, CVA/TIA, DM2, anemia, indeterminate colitis, elevated LFTs, RLS, hiatal hernia, anxiety, depression, and GERD who presents for follow-up of CAD and HFpEF.   She was previously followed by Dr. Alvino Chapel with echo in 07/2016 showing an EF of 55 to 60%, no regional wall motion abnormalities, grade 1 diastolic dysfunction, mild mitral regurgitation, and a mildly dilated left atrium.  Treadmill MPI in 08/2016 showed no significant ischemia with adequate exercise tolerance, and was overall low risk.  Zio patch in 07/2019 showed a predominant rhythm of sinus with an average rate of 101 bpm, a single episode of NSVT lasting 9 beats, 20 episodes of SVT lasting up to 11 beats, and rare PACs/PVCs.  Patient triggered events corresponded to sinus rhythm with PACs.  Echo from 03/2021 demonstrated an EF of 60 to 65%, no regional wall motion abnormalities, grade 1 diastolic dysfunction, normal RV systolic function and ventricular cavity size, mild aortic valve sclerosis without evidence of stenosis, and an estimated right atrial pressure of 3 mmHg.      Prior MRI of the brain in 2022 demonstrated small chronic cortical/subcortical infarct within the left parietal lobe as well as a small chronic lacunar infarcts within the bilateral basal ganglia and changes consistent with chronic small vessel ischemia.     She was seen in the office in 05/2022 and was without symptoms of angina or decompensation.  She did report a 1 week history of exertional shortness of breath when going up or down a flight of stairs.  She also reported an episode during the summer where she  woke up on the floor after a lamp fell on her.  At that time, CT of the head and MRI of the face showed no acute process.  It was unclear if this was associated with a syncopal episode or not.  There was no cardiac pro or postdrome.  Zio patch in 05/2022 showed a predominant rhythm of sinus with an average rate of 103 bpm (range 74 to 150 bpm in sinus), single episode of NSVT occurred lasting 4 beats with a maximum rate of 222 bpm, 11 episodes of SVT lasting up to 7 beats with a maximum rate of 148 bpm, rare PACs and PVCs, and no sustained arrhythmias or prolonged pauses.  Patient triggered events corresponded to sinus rhythm and blocked PAC.   She was seen at outside ED on 07/16/2022 with intermittent confusion and difficulty saying specific words.  CT of the head showed no acute intracranial abnormality.  High-sensitivity troponin negative.  She followed up with PCP and was found to have UTI.  Subsequent MRI of the brain in 09/2022 showed no evidence of acute intracranial abnormality with redemonstrated tiny chronic cortical infarct within the left parietal lobe, redemonstrated tiny chronic lacunar infarct within the right thalamus, and similar appearance of likely chronic small vessel ischemia.   Echo on 07/24/2022 demonstrated an EF of 50 to 55%, no regional wall motion abnormalities, mild LVH, grade 1 diastolic dysfunction, normal RV systolic function and ventricular cavity size, mildly dilated left atrium, and trivial mitral regurgitation.   She was seen in  the office in 07/2022 and continued to report episodes of transient confusion associated with dysarthria without weakness.  CTA head and neck in 11/2022 showed no acute intracranial finding with chronic small vessel ischemic changes.  No intracranial large vessel occlusion or proximal stenosis.  No suspected significant carotid artery stenosis with aortic atherosclerosis noted.   She was admitted to the hospital in 01/2023 with flank pain found to have  pan positive review of systems.  She was treated for AKI presumed to be secondary to dehydration in the setting of dysphagia with nausea and vomiting.  Found to have large amount of stool burden on CT which was likely contributing to her nausea and vomiting.  GI was consulted with very low suspicion for underlying organic GI disorder with symptoms felt to be related to medication side effects and anxiety.  From a CHF perspective, she appeared compensated.  High-sensitivity troponin trended to 51.   She was seen in the office in 02/2023 noting increase in lower extremity edema despite taking 40 mg of furosemide for several weeks.  Her weight was down 22 pounds by our scale when compared to her visit in 07/2022.  She was advised to take furosemide 20 mg twice daily.  Echo on 04/10/2023 showed an EF of 60 to 65%, no regional wall motion abnormalities, grade 1 diastolic dysfunction, normal RV systolic function and ventricular cavity size, mild to moderate mitral regurgitation, moderate mitral annular calcification, mild to moderate tricuspid regurgitation, and an estimated right atrial pressure of 3 mmHg.   She was seen in the office on 04/11/2023 noting intermittent episodes of pain under the left breast that radiated to the left arm.  She was without symptoms of dyspnea and continue to note waxing and waning edema in her lower extremities.  Her weight has been relatively stable.  In the setting of palpitations noted off metoprolol, this was resumed.  Subsequent coronary CTA on 05/02/2023 showed a calcium score of 29.3 which was the 68th percentile.  There was less than 25% LCx and RCA stenosis with severe stenosis estimated at greater than 70% of proximal OM1.  ctFFR of 0.77 in the ostial OM1 with normal ctFFR of the LAD and RCA.  LHC on 05/15/2023 showed severe single-vessel CAD with 90% stenosis of proximal OM1.  There was mild to moderate disease involving the LAD and RCA as outlined below.  Sluggish flow in the  distal LAD on initial images resolved with intracoronary nitroglycerin suggesting some element of coronary vasospasm.  Normal LVEF estimated at 55 to 65% with upper normal filling pressure with an LVEDP of 15 mmHg.  She underwent successful PCI/DES to OM1.  Following cardiac cath that she was started on Imdur 15 mg daily in the setting of 2 episodes of chest discomfort that prompted her to take SL NTG.  Previously noted ankle swelling had improved from prior visits.   She was seen in our office on 06/19/2023 noting ongoing lower extremity swelling and weight gain of 5 pounds by our scale when compared to her visit earlier in the month.  She had recently been started on pregabalin by neurology.  She was taking furosemide 20 mg twice daily.  She continued to note sporadic chest pain that would last only few seconds in duration that was felt to be unlikely related to coronary insufficiency.  She seemed to be tolerating Imdur and was overall feeling better since undergoing PCI to OM1 in 04/2023.  She was volume overloaded with recommendation to increase furosemide  to 40 mg twice daily and replete potassium.   She was admitted to the hospital in late 05/2023 with hyperosmolar nonketotic hyperglycemia with blood glucose reading greater than 600 and treated with insulin drip.  She was seen in the ED on 07/01/2023 with continued hyperglycemia with a glucose of 433, treated with IV fluids and addition of short acting sliding scale insulin.  Management of her diabetes has been challenging with irregular eating habits and with prior self discontinuation of Mounjaro.   She was last seen in the visit in 06/2023 and noted improvement in lower extremity swelling.  She had discontinued pregabalin.  Her weight was down 5 pounds when compared to her prior clinic visit.  She also reported daytime somnolence and falling asleep while standing up or reading.  She was encouraged to follow-up with neurology.  She subsequently  contacted our office indicating she had self discontinued metoprolol indicating she felt like this was contributing to her blood sugars of greater than 600 as well as fatigue.  She was advised by our office that the metoprolol was unlikely to be the cause of her complaints with recommendation for her to follow-up with her PCP and endocrinologist.  She followed up with neurology in 07/2023 for symptoms of memory loss and numbness/tingling.  MRI of the brain showed no acute intracranial process with no evidence of acute or subacute infarct.  CT of the head showed no acute finding.  MRI of the cervical spine showed no cord compression or focal cord lesion.  After undergoing MRI she presented to the ED a day later with multiple complaints including chest pain, shortness of breath, numbness and tingling with decreased left-sided sensation and pain shooting all over her body as well as generalized malaise and fatigue.  Cardiac workup was reassuring including negative high-sensitivity troponin x 2, normal BNP of 28, and EKG showing sinus rhythm with no acute changes.  ***   Labs independently reviewed: 07/2023 - troponin normal x 2, BNP 28, Hgb 11.7, PLT 243, potassium 3.6, BUN 9, serum creatinine 0.69, albumin 4.0, AST 47, ALT 113, TSH normal 05/2023 - TC 92, TG 96, HDL 47, LDL 27 04/2023 - A1c 7.1, LP(a) 31.8  Past Medical History:  Diagnosis Date   Acute on chronic heart failure with preserved ejection fraction (HFpEF) (HCC) 05/16/2018   AKI (acute kidney injury) (HCC) 02/05/2023   Anemia    Anxiety    Arthritis    Blood transfusion without reported diagnosis    Cataract    CHF (congestive heart failure) (HCC)    Chronic kidney disease    UTI, hematuria in urine   Colitis    Crohn's disease (HCC)    Depression    Diabetes (HCC)    Diverticulosis    Frequent headaches    Interstitial cystitis    Recurrent UTI    Restless leg syndrome    TIA (transient ischemic attack) 02/20/2021   Urinary  frequency     Past Surgical History:  Procedure Laterality Date   bariatric bypass  2012   BIOPSY  05/03/2020   Procedure: BIOPSY;  Surgeon: Rachael Fee, MD;  Location: [facility] ENDOSCOPY;  Service: Endoscopy;;   CARPAL TUNNEL RELEASE Right 2003   CARPAL TUNNEL RELEASE Right    2008   CHOLECYSTECTOMY  1975   COLONOSCOPY     COLONOSCOPY WITH PROPOFOL N/A 05/03/2020   Procedure: COLONOSCOPY WITH PROPOFOL;  Surgeon: Rachael Fee, MD;  Location: [facility] ENDOSCOPY;  Service: Endoscopy;  Laterality: N/A;  CORONARY STENT INTERVENTION N/A 05/15/2023   Procedure: CORONARY STENT INTERVENTION;  Surgeon: Yvonne Kendall, MD;  Location: [facility] INVASIVE CV LAB;  Service: Cardiovascular;  Laterality: N/A;   CYSTOSCOPY W/ RETROGRADES Bilateral 06/06/2015   Procedure: CYSTOSCOPY WITH RETROGRADE PYELOGRAM;  Surgeon: Jerilee Field, MD;  Location: [facility] ORS;  Service: Urology;  Laterality: Bilateral;   EYE SURGERY Left 07/06/2022   FL INJ LEFT KNEE CT ARTHROGRAM ([facility] HX) Left    1995   GASTRIC BYPASS  2010   HAND SURGERY Left 01/19/2021   Thumb   HEMORRHOID SURGERY  2013   KNEE ARTHROSCOPY Left 1996   LEFT HEART CATH AND CORONARY ANGIOGRAPHY Left 05/15/2023   Procedure: LEFT HEART CATH AND CORONARY ANGIOGRAPHY;  Surgeon: Yvonne Kendall, MD;  Location: [facility] INVASIVE CV LAB;  Service: Cardiovascular;  Laterality: Left;   TONSILLECTOMY     TOTAL ABDOMINAL HYSTERECTOMY W/ BILATERAL SALPINGOOPHORECTOMY      Current Medications: No outpatient medications have been marked as taking for the 09/11/23 encounter (Appointment) with Sondra Barges, PA-C.    Allergies:   Avelox [moxifloxacin hcl in nacl], Bactrim [sulfamethoxazole-trimethoprim], Ciprofloxacin, Buspar [buspirone], Neomycin-polymyxin-dexameth, Aquaphor [lanolin-petrolatum], Depakote [divalproex sodium], Imitrex [sumatriptan], and Stadol [butorphanol]   Social History   Socioeconomic History   Marital status: Married    Spouse name: Not on  file   Number of children: Not on file   Years of education: Not on file   Highest education level: Some college, no degree  Occupational History   Not on file  Tobacco Use   Smoking status: Former    Current packs/day: 0.00    Types: Cigarettes    Quit date: 04/25/1975    Years since quitting: 48.4   Smokeless tobacco: Never   Tobacco comments:    quit 40 years ago  Vaping Use   Vaping status: Never Used  Substance and Sexual Activity   Alcohol use: No    Alcohol/week: 0.0 standard drinks of alcohol   Drug use: No   Sexual activity: Not Currently    Birth control/protection: Post-menopausal, Surgical  Other Topics Concern   Not on file  Social History Narrative   Caffeine 5 servings per day.   Social Drivers of Health   Financial Resource Strain: Medium Risk (07/09/2023)   Overall Financial Resource Strain (CARDIA)    Difficulty of Paying Living Expenses: Somewhat hard  Food Insecurity: Patient Declined (07/09/2023)   Hunger Vital Sign    Worried About Running Out of Food in the Last Year: Patient declined    Ran Out of Food in the Last Year: Patient declined  Transportation Needs: Unmet Transportation Needs (07/09/2023)   PRAPARE - Administrator, Civil Service (Medical): Yes    Lack of Transportation (Non-Medical): Patient declined  Physical Activity: Insufficiently Active (07/09/2023)   Exercise Vital Sign    Days of Exercise per Week: 3 days    Minutes of Exercise per Session: 20 min  Stress: Stress Concern Present (07/09/2023)   [employer] of Occupational Health - Occupational Stress Questionnaire    Feeling of Stress : Very much  Social Connections: Socially Integrated (07/09/2023)   Social Connection and Isolation Panel [NHANES]    Frequency of Communication with Friends and Family: More than three times a week    Frequency of Social Gatherings with Friends and Family: Patient declined    Attends Religious Services: More than 4 times per  year    Active Member of [employer] or Organizations: Yes  Attends Engineer, structural: More than 4 times per year    Marital Status: Married     Family History:  The patient's family history includes Colon cancer in her mother; Heart failure in her sister; Stroke in her father. There is no history of Bladder Cancer, Kidney disease, Prostate cancer, Kidney cancer, Pancreatic cancer, Esophageal cancer, Stomach cancer, Rectal cancer, or Breast cancer.  ROS:   12-point review of systems is negative unless otherwise noted in the HPI.   EKGs/Labs/Other Studies Reviewed:    Studies reviewed were summarized above. The additional studies were reviewed today:  LHC 05/15/2023: Conclusions: Severe single-vessel coronary artery disease with 90% stenosis of proximal OM1.  There is mild-moderate disease involving the LAD and RCA.  Sluggish flow in the distal LAD on initial images resolved with intracoronary nitroglycerin suggesting some element of coronary vasospasm. Normal left ventricular systolic function (LVEF 55-65%) with upper normal filling pressure (LVEDP 15 mmHg). Successful PCI to OM1 using Onyx Frontier 2.25 x 18 mm drug-eluting stent (postdilated to 2.6 mm) with 0% residual stenosis and TIMI-3 flow. Small right radial artery necessitating use of 91F sheath/catheters.  Consider alternative access for future catheterizations, particularly if larger catheters are necessary.   Recommendations: Overnight observation. Dual antiplatelet therapy with aspirin and clopidogrel for at least 6 months. Aggressive secondary prevention of coronary artery disease.  Will continue low-dose rosuvastatin given very low LDL and concern for myopathy with higher intensity statin therapy. __________   Coronary 05/02/2023: FINDINGS: Aorta: Normal size. Mild aortic wall calcifications. No dissection.   Aortic Valve:  Trileaflet.  No calcifications.   Coronary Arteries:  Normal coronary origin.  Right  dominance.   RCA is a dominant artery. There is calcified plaque proximally causing minimal stenosis (<25%).   Left main gives rise to LAD and LCX arteries. LM has no disease.   LAD has no plaque.   LCX is a non-dominant artery. Minimal proximal LCx stenosis (<25%). There is non calcified plaque in the proximal OM1 causing severe stenosis (>70%).   Other findings:   Normal pulmonary vein drainage into the left atrium.   Normal left atrial appendage without a thrombus.   Normal size of the pulmonary artery.   IMPRESSION: 1. Coronary calcium score of 29.3. This was 68th percentile for age and sex matched control. 2. Normal coronary origin with right dominance. 3. Severe stenosis of proximal OM1 branch (>70%). 4. Minimal RCA and LCx stenosis (<25%) 5. CAD-RADS 4 Severe stenosis. (70-99% or > 50% left main). Cardiac catheterization is recommended. Consider symptom-guided anti-ischemic pharmacotherapy as well as risk factor modification per guideline directed care. 6. Additional analysis with CT FFR will be submitted and reported separately.     crFFR: 1. Left Main:  No significant stenosis.   2. LAD: No significant stenosis.  FFRct 0.95 3. LCX: significant stenosis in ostial OM1. FFRct 0.77 4. RCA: No significant stenosis.  FFRct 0.92   IMPRESSION: 1. CT FFR analysis showed significant stenosis in the proximal first obtuse marginal branch (OM1). FFRct 0.77 2.  Cardiac catheterization recommended. __________   2D echo 04/10/2023: 1. Left ventricular ejection fraction, by estimation, is 60 to 65%. The  left ventricle has normal function. The left ventricle has no regional  wall motion abnormalities. Left ventricular diastolic parameters are  consistent with Grade I diastolic  dysfunction (impaired relaxation).   2. Right ventricular systolic function is normal. The right ventricular  size is normal. Tricuspid regurgitation signal is inadequate for assessing  PA  pressure.   3. The mitral valve is normal in structure. Mild to moderate mitral valve  regurgitation. No evidence of mitral stenosis. Moderate mitral annular  calcification.   4. Tricuspid valve regurgitation is mild to moderate.   5. The aortic valve is tricuspid. Aortic valve regurgitation is not  visualized. No aortic stenosis is present.   6. The inferior vena cava is normal in size with greater than 50%  respiratory variability, suggesting right atrial pressure of 3 mmHg.  __________   2D echo 07/24/2022: 1. Left ventricular ejection fraction, by estimation, is 50 to 55%. Left  ventricular ejection fraction by 2D MOD biplane is 53.8 %. The left  ventricle has low normal function. The left ventricle has no regional wall  motion abnormalities. There is mild left ventricular hypertrophy. Left ventricular diastolic parameters are consistent with Grade I diastolic dysfunction (impaired relaxation). The average left ventricular global longitudinal strain is -16.8 %.   2. Right ventricular systolic function is normal. The right ventricular size is normal.   3. Left atrial size was mildly dilated.   4. The mitral valve is normal in structure. Trivial mitral valve regurgitation.   5. The aortic valve is tricuspid. Aortic valve regurgitation is not visualized. __________    Luci Bank patch 05/2022:   The patient was monitored for 13 days, 23 hours.   The predominant rhythm was sinus with an average rate of 103 bpm (range 74-150 bpm in sinus).   There were rare PAC's and PVC's.   A single episode of nonsustained ventricular tachycardia occurred, lasting 4 beats with a maximum rate of 222 bpm.   There were 11 supraventricular runs, lasting up to 7 beats with a maximum rate of 148 bpm.   No sustained arrhythmia or prolonged pause was observed.   Patient triggered events correspond to sinus rhythm and blocked PAC.   Predominantly sinus rhythm with elevated average heart rate.  Rare PAC's and PVC's  were observed, as well as a few brief episodes of NSVT and PSVT, as detailed above. __________   2D echo 04/20/2021: 1. Left ventricular ejection fraction, by estimation, is 60 to 65%. Left  ventricular ejection fraction by 2D MOD biplane is 64.3 %. The left  ventricle has normal function. The left ventricle has no regional wall  motion abnormalities. Left ventricular  diastolic parameters are consistent with Grade I diastolic dysfunction  (impaired relaxation).   2. Right ventricular systolic function is normal. The right ventricular  size is normal.   3. The mitral valve is grossly normal. No evidence of mitral valve  regurgitation.   4. The aortic valve is tricuspid. Aortic valve regurgitation is not  visualized. Mild aortic valve sclerosis is present, with no evidence of  aortic valve stenosis.   5. The inferior vena cava is normal in size with greater than 50%  respiratory variability, suggesting right atrial pressure of 3 mmHg.   Comparison(s): LVEF 55-60%.  __________   Luci Bank patch 07/2019: The patient was monitored for 12 days, 14 hours. The predominant rhythm was sinus with an average rate of 101 bpm (range 66 to 150 bpm in sinus). Rare PACs and PVCs were noted. A single episode of nonsustained ventricular tachycardia lasting nine beats occurred, with a maximum rate of 152 bpm. There were 20 episodes of supraventricular tachycardia lasting up to 11 beats with a maximal rate of 176 bpm. No sustained arrhythmia or prolonged pause was observed. Patient triggered events correspond to sinus rhythm with PACs.   Predominantly  sinus rhythm with rare PACs and PVCs as well as brief PSVT and NSVT.  Patient triggered events correspond to sinus rhythm with PACs. __________   Treadmill MPI 08/30/2016: Exercise  myocardial perfusion imaging study with no significant  ischemia Normal wall motion, EF estimated at 50% No EKG changes concerning for ischemia at peak stress or in  recovery. Target heart rate achieved Adequate exercise tolerance, exercised for 5:50 min Low risk scan __________   2D echo 08/23/2016: - Left ventricle: The cavity size was normal. Wall thickness was    normal. Systolic function was normal. The estimated ejection    fraction was in the range of 55% to 60%. Wall motion was normal;    there were no regional wall motion abnormalities. Doppler    parameters are consistent with abnormal left ventricular    relaxation (grade 1 diastolic dysfunction).  - Mitral valve: There was mild regurgitation.  - Left atrium: The atrium was mildly dilated.    EKG:  EKG is ordered today.  The EKG ordered today demonstrates ***  Recent Labs: 02/10/2023: Magnesium 2.1 08/23/2023: ALT 113; TSH 1.430 08/24/2023: B Natriuretic Peptide 28.9; BUN 9; Creatinine, Ser 0.69; Hemoglobin 11.7; Platelets 243; Potassium 3.6; Sodium 134  Recent Lipid Panel    Component Value Date/Time   CHOL 92 (L) 06/18/2023 1337   CHOL 139 07/17/2016 1408   CHOL 148 01/10/2012 0717   TRIG 96 06/18/2023 1337   TRIG 99 07/17/2016 1408   TRIG 110 01/10/2012 0717   HDL 47 06/18/2023 1337   HDL 47 01/10/2012 0717   CHOLHDL 2.0 06/18/2023 1337   CHOLHDL 2.0 04/26/2020 0339   VLDL 13 04/26/2020 0339   VLDL 20 07/17/2016 1408   VLDL 22 01/10/2012 0717   LDLCALC 27 06/18/2023 1337   LDLCALC 79 01/10/2012 0717    PHYSICAL EXAM:    VS:  LMP  (LMP Unknown)   BMI: There is no height or weight on file to calculate BMI.  Physical Exam  Wt Readings from Last 3 Encounters:  09/09/23 123 lb (55.8 kg)  09/09/23 121 lb 12.8 oz (55.2 kg)  08/24/23 116 lb (52.6 kg)     ASSESSMENT & PLAN:   CAD involving the native coronary arteries with ***:  HFpEF:  Palpitations with PSVT:  History of CVA:  Sleep disturbance:  Uncontrolled insulin-dependent diabetes mellitus:  Elevated LFTs:   {Are you ordering a CV Procedure (e.g. stress test, cath, DCCV, TEE, etc)?   Press F2         :[id number]}     Disposition: F/u with Dr. Okey Dupre or an APP in ***.   Medication Adjustments/Labs and Tests Ordered: Current medicines are reviewed at length with the patient today.  Concerns regarding medicines are outlined above. Medication changes, Labs and Tests ordered today are summarized above and listed in the Patient Instructions accessible in Encounters.   Signed, Eula Listen, PA-C 09/10/2023 4:59 PM     [facility] [place] [address] [place], [place] [address] [phone]

## 2023-09-11 ENCOUNTER — Ambulatory Visit: Payer: Medicare Other | Admitting: Physician Assistant

## 2023-09-12 ENCOUNTER — Telehealth: Payer: Self-pay

## 2023-09-12 ENCOUNTER — Ambulatory Visit: Payer: Self-pay | Admitting: *Deleted

## 2023-09-12 NOTE — Telephone Encounter (Signed)
The nurse was approached by the patient's sister, who reported that the patient is experiencing leg swelling. The patient's sister consulted with Alycia Rossetti, [place], and showed him a picture of the swelling. Per Alycia Rossetti, he recommended that the patient report to the ER for further evaluation.  The nurse contacted the patient, who reported bilateral leg swelling up to the knees for the past week, with more swelling on the left leg than the right. The patient also reported shortness of breath with exertion and a 3-pound weight gain overnight, going from 116 to 119, and as of today, 121 pounds.  The patient stated that she was recently seen by her PCP 2-3 weeks ago, who prescribed her Lyrica. She took the medication for 1 1/2 weeks and noticed the swelling developing. The patient reported she took her last dose on Monday night, recalling that she was previously prescribed this medication by her neurologist. However, Dr. Okey Dupre recommended stopping the medication due to the swelling.  The nurse informed the patient of Ryan's recommendation to report to the ER, but the patient refused, stating that she would prefer to be seen in the office.  An appointment is scheduled for Monday, 3/17, with Dr. Mariah Milling (DOD) for further evaluation. The patient was also made aware of ED precautions should any new symptoms develop or worsen.

## 2023-09-12 NOTE — Patient Outreach (Signed)
  Care Coordination   09/12/2023 Name: Amy Hansen MRN: [id number] DOB: 09-28-1955   Care Coordination Outreach Attempts:  An unsuccessful telephone outreach was attempted today to offer the patient information about available complex care management services.  Follow Up Plan:  Additional outreach attempts will be made to offer the patient complex care management information and services.   Encounter Outcome:  No Answer   Care Coordination Interventions:  No, not indicated     Ester Mabe, LCSW   [facility], [facility] Licensed Clinical Social Worker Care Coordinator  Direct Dial: [phone]

## 2023-09-12 NOTE — Telephone Encounter (Signed)
Pt called stating she is unable to attend appointment scheduled for 09/16/23 due to a prior arranged appointment. Appointment rescheduled for 09/17/23 at 11am .

## 2023-09-12 NOTE — Telephone Encounter (Signed)
Pt called in stating she cannot make appt because she has another appt.

## 2023-09-16 ENCOUNTER — Ambulatory Visit: Payer: Medicare Other | Admitting: Cardiovascular Disease

## 2023-09-16 ENCOUNTER — Encounter: Payer: Self-pay | Admitting: Gastroenterology

## 2023-09-16 NOTE — Progress Notes (Unsigned)
Cardiology Office Note  Date:  09/17/2023   ID:  Devynne, Sturdivant 14-Aug-1955, MRN [id number]  PCP:  [name], NP   Chief Complaint  Patient presents with   Bilateral LE edema     Patient c/o bilateral LE edema this past weekend. Patient started Lyrica 2 weeks ago, she started to notice an increase in swelling, since stopping the Lyrica, the swelling in legs has subsided significantly. The patient was instructed to increase Lasix to 40 mg twice a day.      HPI:  PRISCELLA DONNA is a 68 y.o. female with history of  CAD status post recent PCI/DES to OM1 in 04/2023,  HFpEF,  PSVT,  CVA/TIA,  DM2,  anemia,  colitis, elevated LFTs, RLS, hiatal hernia, anxiety, depression, and GERD  who presents for follow-up of CAD and HFpEF, leg swelling  Husband with flu Long history of  anxiety followed by therapist In the past, stopped lyrica secondary to hallucinations , months ago For worsening anxiety, insomnia, hypersensitivity to noise, was restarted on Lyrica double dose approximately 2 weeks ago Since then with worsening leg swelling, severe this past weekend Stopped lyrica, 2/14, Swelling coming down over the past week, close to baseline Lasix 40 BID up form lasix 20 BID past few days   EKG personally reviewed by myself on todays visit EKG Interpretation Date/Time:  Tuesday September 17 2023 11:18:24 EST Ventricular Rate:  72 PR Interval:  164 QRS Duration:  84 QT Interval:  384 QTC Calculation: 420 R Axis:   -12  Text Interpretation: Normal sinus rhythm Normal ECG When compared with ECG of 24-Aug-2023 17:33, No significant change was found Confirmed by Julien Nordmann [phone]) on 09/17/2023 11:35:26 AM    Labs independently reviewed: 06/2023 - potassium 4.1, BUN 18, serum creatinine 0.8, albumin 3.2, AST 47, ALT normal, Hgb 11.3, PLT 331 05/2023 - TC 92, TG 96, HDL 47, LDL 27 04/2023 - A1c 7.1, LP(a) 31.8 11/2022 - TSH normal   PMH:   has a past medical history of  Acute on chronic heart failure with preserved ejection fraction (HFpEF) (HCC) (05/16/2018), AKI (acute kidney injury) (HCC) (02/05/2023), Anemia, Anxiety, Arthritis, Blood transfusion without reported diagnosis, Cataract, CHF (congestive heart failure) (HCC), Chronic kidney disease, Colitis, Crohn's disease (HCC), Depression, Diabetes (HCC), Diverticulosis, Frequent headaches, Interstitial cystitis, Recurrent UTI, Restless leg syndrome, TIA (transient ischemic attack) (02/20/2021), and Urinary frequency.  PSH:    Past Surgical History:  Procedure Laterality Date   bariatric bypass  2012   BIOPSY  05/03/2020   Procedure: BIOPSY;  Surgeon: Rachael Fee, MD;  Location: [facility] ENDOSCOPY;  Service: Endoscopy;;   CARPAL TUNNEL RELEASE Right 2003   CARPAL TUNNEL RELEASE Right    2008   CHOLECYSTECTOMY  1975   COLONOSCOPY     COLONOSCOPY WITH PROPOFOL N/A 05/03/2020   Procedure: COLONOSCOPY WITH PROPOFOL;  Surgeon: Rachael Fee, MD;  Location: [facility] ENDOSCOPY;  Service: Endoscopy;  Laterality: N/A;   CORONARY STENT INTERVENTION N/A 05/15/2023   Procedure: CORONARY STENT INTERVENTION;  Surgeon: Yvonne Kendall, MD;  Location: [facility] INVASIVE CV LAB;  Service: Cardiovascular;  Laterality: N/A;   CYSTOSCOPY W/ RETROGRADES Bilateral 06/06/2015   Procedure: CYSTOSCOPY WITH RETROGRADE PYELOGRAM;  Surgeon: Jerilee Field, MD;  Location: [facility] ORS;  Service: Urology;  Laterality: Bilateral;   EYE SURGERY Left 07/06/2022   FL INJ LEFT KNEE CT ARTHROGRAM ([facility] HX) Left    1995   GASTRIC BYPASS  2010   HAND SURGERY Left 01/19/2021  Thumb   HEMORRHOID SURGERY  2013   KNEE ARTHROSCOPY Left 1996   LEFT HEART CATH AND CORONARY ANGIOGRAPHY Left 05/15/2023   Procedure: LEFT HEART CATH AND CORONARY ANGIOGRAPHY;  Surgeon: Yvonne Kendall, MD;  Location: [facility] INVASIVE CV LAB;  Service: Cardiovascular;  Laterality: Left;   TONSILLECTOMY     TOTAL ABDOMINAL HYSTERECTOMY W/ BILATERAL SALPINGOOPHORECTOMY       Current Outpatient Medications  Medication Sig Dispense Refill   ALPRAZolam (XANAX) 0.5 MG tablet Take 1 tablet (0.5 mg total) by mouth 2 (two) times daily as needed for anxiety. Must last 15 days. 30 tablet 5   aspirin EC 81 MG tablet Take 1 tablet (81 mg) by mouth once daily. Swallow whole.     cephALEXin (KEFLEX) 250 MG capsule Take 250 mg by mouth daily.     cholecalciferol (CHOLECALCIFEROL) 25 MCG tablet Take 1 tablet (1,000 Units total) by mouth daily. 30 tablet 3   clopidogrel (PLAVIX) 75 MG tablet Take 1 tablet (75 mg total) by mouth daily with breakfast. 30 tablet 3   estradiol (ESTRACE) 0.1 MG/GM vaginal cream Apply one pea-sized amount around the opening of the urethra 3 times weekly. 42.5 g 12   furosemide (LASIX) 40 MG tablet Take 40 mg by mouth 2 (two) times daily.     insulin aspart (NOVOLOG FLEXPEN) 100 UNIT/ML FlexPen Inject 1 Units into the skin 3 (three) times daily with meals. Based on premeal blood sugar: 120-150- 1 unit 151-200- 2 units 201-250- 3 units 251-300- 4 units 301-350- 5 units 351-400- 6 units >400, take 6 units and repeat CBG in 1 hour 15 mL 11   insulin glargine (LANTUS) 100 UNIT/ML Solostar Pen Inject 17 Units into the skin daily. 17 mL 11   isosorbide mononitrate (IMDUR) 30 MG 24 hr tablet Take 0.5 tablets (15 mg total) by mouth daily. 45 tablet 3   nitroGLYCERIN (NITROSTAT) 0.4 MG SL tablet Place 1 tablet (0.4 mg total) under the tongue every 5 (five) minutes as needed. 25 tablet 3   ondansetron (ZOFRAN-ODT) 8 MG disintegrating tablet Take 1 tablet (8 mg total) by mouth every 8 (eight) hours as needed for nausea or vomiting. 60 tablet 1   pantoprazole (PROTONIX) 40 MG tablet Take 1 tablet (40 mg total) by mouth daily. 90 tablet 3   pramipexole (MIRAPEX) 1 MG tablet Take 1 tablet (1 mg total) by mouth at bedtime. 90 tablet 0   rOPINIRole (REQUIP) 1 MG tablet Take 1 mg by mouth 2 (two) times daily.     rosuvastatin (CRESTOR) 5 MG tablet TAKE 1 TABLET DAILY 90  tablet 1   sertraline (ZOLOFT) 100 MG tablet Take 1 tablet (100 mg total) by mouth daily. 30 tablet 0   tirzepatide (MOUNJARO) 5 MG/0.5ML Pen Inject 5 mg into the skin once a week. 6 mL 0   traZODone (DESYREL) 50 MG tablet Take 0.5-2 tablets (25-100 mg total) by mouth at bedtime as needed for sleep. 60 tablet 0   Insulin Pen Needle (PEN NEEDLES 3/16") 31G X 5 MM MISC 1 Units by Does not apply route as needed. (Patient not taking: Reported on 09/17/2023) 100 each 2   potassium chloride SA (KLOR-CON M20) 20 MEQ tablet Take 1 tablet (20 mEq total) by mouth 2 (two) times daily. (Patient not taking: Reported on 09/17/2023) 90 tablet 3   pregabalin (LYRICA) 75 MG capsule 1 po at bedtime for 1 week, then increase to 1 po bid. (Patient not taking: Reported on 09/17/2023) 60 capsule  0   No current facility-administered medications for this visit.    Allergies:   Avelox [moxifloxacin hcl in nacl], Bactrim [sulfamethoxazole-trimethoprim], Ciprofloxacin, Buspar [buspirone], Neomycin-polymyxin-dexameth, Aquaphor [lanolin-petrolatum], Depakote [divalproex sodium], Imitrex [sumatriptan], and Stadol [butorphanol]   Social History:  The patient  reports that she quit smoking about 48 years ago. Her smoking use included cigarettes. She has never used smokeless tobacco. She reports that she does not drink alcohol and does not use drugs.   Family History:   family history includes Colon cancer in her mother; Heart failure in her sister; Stroke in her father.    Review of Systems: Review of Systems  Constitutional: Negative.   HENT: Negative.    Respiratory: Negative.    Cardiovascular:  Positive for leg swelling.  Gastrointestinal: Negative.   Musculoskeletal: Negative.   Neurological: Negative.   Psychiatric/Behavioral: Negative.    All other systems reviewed and are negative.    PHYSICAL EXAM: VS:  BP 100/60 (BP Location: Left Arm, Patient Position: Sitting, Cuff Size: Normal)   Pulse 72   Ht 5' (1.524  m)   Wt 120 lb 8 oz (54.7 kg)   LMP  (LMP Unknown)   SpO2 98%   BMI 23.53 kg/m  , BMI Body mass index is 23.53 kg/m. GEN: Well nourished, well developed, in no acute distress HEENT: normal Neck: no JVD, carotid bruits, or masses Cardiac: RRR; no murmurs, rubs, or gallops, Trace ankle swelling above the sock line Respiratory:  clear to auscultation bilaterally, normal work of breathing GI: soft, nontender, nondistended, + BS MS: no deformity or atrophy Skin: warm and dry, no rash. Neuro:  Strength and sensation are intact Psych: euthymic mood, full affect   Recent Labs: 02/10/2023: Magnesium 2.1 08/23/2023: ALT 113; TSH 1.430 08/24/2023: B Natriuretic Peptide 28.9; BUN 9; Creatinine, Ser 0.69; Hemoglobin 11.7; Platelets 243; Potassium 3.6; Sodium 134    Lipid Panel Lab Results  Component Value Date   CHOL 92 (L) 06/18/2023   HDL 47 06/18/2023   LDLCALC 27 06/18/2023   TRIG 96 06/18/2023      Wt Readings from Last 3 Encounters:  09/17/23 120 lb 8 oz (54.7 kg)  09/09/23 123 lb (55.8 kg)  09/09/23 121 lb 12.8 oz (55.2 kg)      ASSESSMENT AND PLAN:  Problem List Items Addressed This Visit       Cardiology Problems   Coronary artery disease   Relevant Medications   furosemide (LASIX) 40 MG tablet   Other Relevant Orders   EKG 12-Lead (Completed)   PSVT (paroxysmal supraventricular tachycardia) (HCC)   Relevant Medications   furosemide (LASIX) 40 MG tablet   Other Relevant Orders   EKG 12-Lead (Completed)     Other   Type 2 diabetes mellitus with hyperglycemia (HCC) (Chronic)   History of stroke   Other Visit Diagnoses       Chronic heart failure with preserved ejection fraction (HCC)    -  Primary   Relevant Medications   furosemide (LASIX) 40 MG tablet   Other Relevant Orders   EKG 12-Lead (Completed)     Elevated LFTs         Chronic diastolic CHF (congestive heart failure) (HCC)       Relevant Medications   furosemide (LASIX) 40 MG tablet   Other  Relevant Orders   EKG 12-Lead (Completed)      Chronic diastolic CHF Worsening leg swelling over the past weeks likely secondary to  Lyrica She did not tolerate Lyrica  secondary to hallucinations in the past Reports having some hallucinations again this time with worsening leg swelling Legs swelling has resolved by holding Lyrica and taking Lasix 40 twice daily Close to baseline, weight around 120 pounds same as it was back in October 2024 with Recommend she consider going back to Lasix 20 twice daily tomorrow  Coronary artery disease with stable angina Currently with no symptoms of angina. No further workup at this time. Continue current medication regimen.  Hyperlipidemia Cholesterol at goal   Signed, Dossie Arbour, M.D., Ph.D. [facility] [place], [place] [phone]

## 2023-09-17 ENCOUNTER — Ambulatory Visit: Payer: Medicare Other | Attending: Cardiovascular Disease | Admitting: Cardiovascular Disease

## 2023-09-17 ENCOUNTER — Telehealth: Payer: Self-pay | Admitting: *Deleted

## 2023-09-17 ENCOUNTER — Encounter: Payer: Self-pay | Admitting: Cardiovascular Disease

## 2023-09-17 VITALS — BP 100/60 | HR 72 | Ht 60.0 in | Wt 120.5 lb

## 2023-09-17 DIAGNOSIS — Z8673 Personal history of transient ischemic attack (TIA), and cerebral infarction without residual deficits: Secondary | ICD-10-CM

## 2023-09-17 DIAGNOSIS — Z794 Long term (current) use of insulin: Secondary | ICD-10-CM

## 2023-09-17 DIAGNOSIS — I5032 Chronic diastolic (congestive) heart failure: Secondary | ICD-10-CM

## 2023-09-17 DIAGNOSIS — R7989 Other specified abnormal findings of blood chemistry: Secondary | ICD-10-CM | POA: Diagnosis not present

## 2023-09-17 DIAGNOSIS — E1165 Type 2 diabetes mellitus with hyperglycemia: Secondary | ICD-10-CM

## 2023-09-17 DIAGNOSIS — I25708 Atherosclerosis of coronary artery bypass graft(s), unspecified, with other forms of angina pectoris: Secondary | ICD-10-CM

## 2023-09-17 DIAGNOSIS — I471 Supraventricular tachycardia, unspecified: Secondary | ICD-10-CM | POA: Diagnosis not present

## 2023-09-17 NOTE — Progress Notes (Unsigned)
Complex Care Management Care Guide Note  09/17/2023 Name: [name] MRN: [id number] DOB: June 10, 1956  Amy Hansen is a 68 y.o. year old female who is a primary care patient of [name], NP and is actively engaged with the care management team. I reached out to [name] by phone today to assist with re-scheduling  with the Licensed Clinical Child psychotherapist.  Follow up plan: Unsuccessful telephone outreach attempt made. A HIPAA compliant phone message was left for the patient providing contact information and requesting a return call.  Burman Nieves, CMA, Care Guide [facility]  [facility], [facility] Guide Direct Dial: [phone]  Fax: [phone] Website: [email or web address]

## 2023-09-17 NOTE — Patient Instructions (Addendum)
Medication Instructions:  No changes Consider decreasing lasix back to 20 mg twice a day tomorrow if swelling better Take with potassium  If you need a refill on your cardiac medications before your next appointment, please call your pharmacy.   Lab work: No new labs needed  Testing/Procedures: No new testing needed  Follow-Up: At [facility], you and your health needs are our priority.  As part of our continuing mission to provide you with exceptional heart care, we have created designated Provider Care Teams.  These Care Teams include your primary Cardiologist (physician) and Advanced Practice Providers (APPs -  Physician Assistants and Nurse Practitioners) who all work together to provide you with the care you need, when you need it.  You will need a follow up appointment in 6 months with Dr. Okey Dupre  Providers on your designated Care Team:   Nicolasa Ducking, NP Eula Listen, PA-C Cadence Fransico Michael, [place]  COVID-19 Vaccine Information can be found at: [email or web address] For questions related to vaccine distribution or appointments, please email [email or web address] or call [phone].

## 2023-09-18 ENCOUNTER — Ambulatory Visit: Payer: Self-pay | Admitting: Nurse Practitioner

## 2023-09-18 NOTE — Progress Notes (Signed)
Complex Care Management Care Guide Note  09/18/2023 Name: [name] MRN: [id number] DOB: March 04, 1956  Amy Hansen is a 68 y.o. year old female who is a primary care patient of [name], NP and is actively engaged with the care management team. I reached out to [name] by phone today to assist with re-scheduling  with the Licensed Clinical Child psychotherapist.  Follow up plan: Telephone appointment with complex care management team member scheduled for:  09/26/2023  Burman Nieves, CMA, Care Guide [facility], [facility] Guide Direct Dial: [phone]  Fax: [phone] Website: [email or web address]

## 2023-09-18 NOTE — Progress Notes (Signed)
Complex Care Management Care Guide Note  09/18/2023 Name: Amy Hansen MRN: [id number] DOB: [birth year]  Amy Hansen is a 68 y.o. year old female who is a primary care patient of [name], NP and is actively engaged with the care management team. I reached out to Amy Hansen by phone today to assist with re-scheduling  with the Licensed Clinical Child psychotherapist.  Follow up plan: unsuccessful telephone outreach attempt made. A HIPAA compliant phone message was left for the patient providing contact information and requesting a return call. NO additional outreach attempts will be made due to inability to maintain patient contact.   Burman Nieves, CMA, Care Guide [facility]  [facility], [facility] Guide Direct Dial: [phone]  Fax: [phone] Website: [email or web address]

## 2023-09-19 ENCOUNTER — Telehealth: Payer: Self-pay | Admitting: Pharmacy Technician

## 2023-09-19 NOTE — Telephone Encounter (Signed)
[name],  I have faxed forms for MD signature for replacement vials.  Please have Dr. Adela Lank complete and sign the forms and return to me. Forms have been faxed to [phone]  Thanks Selena Batten

## 2023-09-19 NOTE — Telephone Encounter (Signed)
Thanks. I signed it this afternoon. In my outbox. Thanks

## 2023-09-19 NOTE — Telephone Encounter (Signed)
Forms received, placed in Dr. Lanetta Inch office for signatures.

## 2023-09-20 ENCOUNTER — Other Ambulatory Visit: Payer: Self-pay | Admitting: Cardiology

## 2023-09-20 NOTE — Telephone Encounter (Signed)
All signed forms have been faxed back to [employer] at [phone].

## 2023-09-22 DIAGNOSIS — L600 Ingrowing nail: Secondary | ICD-10-CM | POA: Diagnosis not present

## 2023-09-25 ENCOUNTER — Encounter: Payer: Self-pay | Admitting: Podiatry

## 2023-09-25 ENCOUNTER — Encounter: Payer: Self-pay | Admitting: Gastroenterology

## 2023-09-25 ENCOUNTER — Encounter: Payer: Self-pay | Admitting: Oncology

## 2023-09-25 ENCOUNTER — Ambulatory Visit: Payer: Self-pay | Admitting: Podiatry

## 2023-09-25 DIAGNOSIS — R748 Abnormal levels of other serum enzymes: Secondary | ICD-10-CM | POA: Diagnosis not present

## 2023-09-25 DIAGNOSIS — L03032 Cellulitis of left toe: Secondary | ICD-10-CM

## 2023-09-25 NOTE — Progress Notes (Signed)
  Subjective:  Patient ID: Amy Hansen, female    DOB: September 24, 1955,  MRN: [id number]  Chief Complaint  Patient presents with   Diabetes    "The big left toe, I started cutting it.  I tried to remove it.  I made it worse."    68 y.o. female presents with the above complaint. History confirmed with patient.  She got a prescription for Augmentin and is taking this currently  Objective:  Physical Exam: warm, good capillary refill, no trophic changes or ulcerative lesions, normal DP and PT pulses, normal sensory exam, and ingrown nail left hallux medial border.  Assessment:   1. Paronychia of great toe of left foot      Plan:  Patient was evaluated and treated and all questions answered.    Ingrown Nail, left -Patient elects to proceed with minor surgery to remove ingrown toenail today. Consent reviewed and signed by patient. -Ingrown nail excised. See procedure note. -Educated on post-procedure care including soaking. Written instructions provided and reviewed. -Complete Augmentin Rx  Procedure: Excision of Ingrown Toenail Location: Left 1st toe medial nail borders. Anesthesia: Lidocaine 1% plain; 1.5 mL and Marcaine 0.5% plain; 1.5 mL, digital block. Skin Prep: Betadine. Dressing: Silvadene; telfa; dry, sterile, compression dressing. Technique: Following skin prep, the toe was exsanguinated and a tourniquet was secured at the base of the toe. The affected nail border was freed, split with a nail splitter, and excised.  No matrixectomy was performed. The tourniquet was then removed and sterile dressing applied. Disposition: Patient tolerated procedure well.    Return if symptoms worsen or fail to improve.

## 2023-09-25 NOTE — Patient Instructions (Signed)
 Soak Instructions    THE DAY AFTER THE PROCEDURE  Place 1/4 cup of epsom salts (or betadine, or white vinegar) in a quart of warm tap water.  Submerge your foot or feet with outer bandage intact for the initial soak; this will allow the bandage to become moist and wet for easy lift off.  Once you remove your bandage, continue to soak in the solution for 20 minutes.  This soak should be done twice a day.  Next, remove your foot or feet from solution, blot dry the affected area and cover.  You may use a band aid large enough to cover the area or use gauze and tape.  Apply other medications to the area as directed by the doctor such as polysporin neosporin.  IF YOUR SKIN BECOMES IRRITATED WHILE USING THESE INSTRUCTIONS, IT IS OKAY TO SWITCH TO  WHITE VINEGAR AND WATER. Or you may use antibacterial soap and water to keep the toe clean  Monitor for any signs/symptoms of infection. Call the office immediately if any occur or go directly to the emergency room. Call with any questions/concerns.

## 2023-09-26 ENCOUNTER — Telehealth: Payer: Self-pay | Admitting: *Deleted

## 2023-09-26 ENCOUNTER — Encounter: Payer: Self-pay | Admitting: *Deleted

## 2023-09-26 NOTE — Patient Outreach (Signed)
 Care Coordination   09/26/2023 Name: Amy Hansen MRN: [id number] DOB: September 05, 1955   Care Coordination Outreach Attempts:  A second unsuccessful outreach was attempted today to offer the patient with information about available complex care management services.  Follow Up Plan:  Additional outreach attempts will be made to offer the patient complex care management information and services.   Encounter Outcome:  No Answer   Care Coordination Interventions:  No, not indicated     Kiran Lapine, LCSW [place]  [facility], [facility] Licensed Clinical Social Worker Care Coordinator  Direct Dial: [phone]

## 2023-09-27 ENCOUNTER — Encounter: Payer: Self-pay | Admitting: Oncology

## 2023-09-27 ENCOUNTER — Encounter: Payer: Self-pay | Admitting: Gastroenterology

## 2023-09-30 DIAGNOSIS — M1711 Unilateral primary osteoarthritis, right knee: Secondary | ICD-10-CM | POA: Diagnosis not present

## 2023-10-02 ENCOUNTER — Other Ambulatory Visit: Payer: Self-pay | Admitting: Physician Assistant

## 2023-10-02 ENCOUNTER — Telehealth: Payer: Self-pay | Admitting: Podiatry

## 2023-10-02 MED ORDER — AMOXICILLIN-POT CLAVULANATE 875-125 MG PO TABS
1.0000 | ORAL_TABLET | Freq: Two times a day (BID) | ORAL | 0 refills | Status: DC
Start: 2023-10-02 — End: 2023-11-26

## 2023-10-02 NOTE — Telephone Encounter (Signed)
 Pt called stating she saw Dr Lilian Kapur last week and had half her toe nail taken off. Pt was on antibiotics and they are gone. Pt states today it is extremely sore she had a sharp pain in it. Pt states she feels like it still looks angry and she wants to know what to do.

## 2023-10-03 ENCOUNTER — Other Ambulatory Visit: Payer: Self-pay | Admitting: Physician Assistant

## 2023-10-04 ENCOUNTER — Encounter: Payer: Self-pay | Admitting: Physician Assistant

## 2023-10-04 ENCOUNTER — Ambulatory Visit (INDEPENDENT_AMBULATORY_CARE_PROVIDER_SITE_OTHER): Payer: Medicare Other | Admitting: Physician Assistant

## 2023-10-04 DIAGNOSIS — F411 Generalized anxiety disorder: Secondary | ICD-10-CM | POA: Diagnosis not present

## 2023-10-04 DIAGNOSIS — G2581 Restless legs syndrome: Secondary | ICD-10-CM | POA: Diagnosis not present

## 2023-10-04 DIAGNOSIS — G4700 Insomnia, unspecified: Secondary | ICD-10-CM | POA: Diagnosis not present

## 2023-10-04 DIAGNOSIS — F319 Bipolar disorder, unspecified: Secondary | ICD-10-CM

## 2023-10-04 MED ORDER — ALPRAZOLAM 0.5 MG PO TABS: 0.5000 mg | ORAL_TABLET | Freq: Two times a day (BID) | ORAL | 5 refills | Status: DC | PRN

## 2023-10-04 MED ORDER — SERTRALINE HCL 100 MG PO TABS
100.0000 mg | ORAL_TABLET | Freq: Every day | ORAL | 5 refills | Status: DC
Start: 2023-10-04 — End: 2024-04-02

## 2023-10-04 MED ORDER — QUETIAPINE FUMARATE 300 MG PO TABS: 300.0000 mg | ORAL_TABLET | Freq: Every day | ORAL | 5 refills | Status: DC

## 2023-10-04 NOTE — Progress Notes (Signed)
 [facility]  Patient ID: Amy Hansen,  MRN: [id number]  PCP: [name], NP  Date of Evaluation: 10/04/2023 Time spent:20 minutes  Chief Complaint:  Chief Complaint   Anxiety; Depression; Insomnia; Follow-up    HISTORY/CURRENT STATUS: For routine med check.  Accompanied by granddaughter, [name].  [name] is doing well.  She's had some physical issues and had to f/u with her neuro and cardiologist. But from a mental health standpoint, she's feeling good. The current dose of Xanax is helpful. No PA.  Sometimes she has pills leftover at the time she can get it RF.  Denies fall or confusion.  Patient is able to enjoy things.  Energy and motivation are good.  No extreme sadness, tearfulness, or feelings of hopelessness.  Sleeps well most of the time. ADLs and personal hygiene are normal.   No change in memory.  Appetite has not changed.  Weight is stable. Denies suicidal or homicidal thoughts.  Patient denies increased energy with decreased need for sleep, increased talkativeness, racing thoughts, impulsivity or risky behaviors, increased spending, increased libido, grandiosity, increased irritability or anger, paranoia, or hallucinations.  Denies dizziness, syncope, seizures, numbness, tingling, tremor, tics, unsteady gait, slurred speech, confusion. Denies muscle or joint pain, stiffness, or dystonia.   Individual Medical History/ Review of Systems: Changes? :Yes   see chart  Past medications for mental health diagnoses include: Trazodone, Risperdal, Zoloft, Lunesta, prazosin, Sonata, Prozac, Depakote, Lamictal, lithium, Wellbutrin, Xanax, Ambien, carbamazepine, Seroquel, Buspar caused falls and hallucinations, Gabapentin-she doesn't want to take  Allergies: Avelox [moxifloxacin hcl in nacl], Bactrim [sulfamethoxazole-trimethoprim], Ciprofloxacin, Buspar [buspirone], Neomycin-polymyxin-dexameth, Aquaphor [lanolin-petrolatum], Depakote [divalproex sodium], Imitrex  [sumatriptan], and Stadol [butorphanol]  Current Medications:  Current Outpatient Medications:    amoxicillin-clavulanate (AUGMENTIN) 875-125 MG tablet, Take 1 tablet by mouth 2 (two) times daily., Disp: 28 tablet, Rfl: 0   aspirin EC 81 MG tablet, Take 1 tablet (81 mg) by mouth once daily. Swallow whole., Disp: , Rfl:    cholecalciferol (CHOLECALCIFEROL) 25 MCG tablet, Take 1 tablet (1,000 Units total) by mouth daily., Disp: 30 tablet, Rfl: 3   clopidogrel (PLAVIX) 75 MG tablet, Take 1 tablet (75 mg total) by mouth daily with breakfast., Disp: 90 tablet, Rfl: 3   estradiol (ESTRACE) 0.1 MG/GM vaginal cream, Apply one pea-sized amount around the opening of the urethra 3 times weekly., Disp: 42.5 g, Rfl: 12   furosemide (LASIX) 40 MG tablet, Take 40 mg by mouth 2 (two) times daily., Disp: , Rfl:    insulin aspart (NOVOLOG FLEXPEN) 100 UNIT/ML FlexPen, Inject 1 Units into the skin 3 (three) times daily with meals. Based on premeal blood sugar: 120-150- 1 unit 151-200- 2 units 201-250- 3 units 251-300- 4 units 301-350- 5 units 351-400- 6 units >400, take 6 units and repeat CBG in 1 hour, Disp: 15 mL, Rfl: 11   insulin glargine (LANTUS) 100 UNIT/ML Solostar Pen, Inject 17 Units into the skin daily., Disp: 17 mL, Rfl: 11   Insulin Pen Needle (PEN NEEDLES 3/16") 31G X 5 MM MISC, 1 Units by Does not apply route as needed., Disp: 100 each, Rfl: 2   ondansetron (ZOFRAN-ODT) 8 MG disintegrating tablet, Take 1 tablet (8 mg total) by mouth every 8 (eight) hours as needed for nausea or vomiting., Disp: 60 tablet, Rfl: 1   pantoprazole (PROTONIX) 40 MG tablet, Take 1 tablet (40 mg total) by mouth daily., Disp: 90 tablet, Rfl: 3   pramipexole (MIRAPEX) 1 MG tablet, Take 1 tablet (1 mg total) by  mouth at bedtime., Disp: 90 tablet, Rfl: 0   rOPINIRole (REQUIP) 1 MG tablet, Take 1 mg by mouth 2 (two) times daily., Disp: , Rfl:    rosuvastatin (CRESTOR) 5 MG tablet, TAKE 1 TABLET DAILY, Disp: 90 tablet, Rfl: 1    tirzepatide (MOUNJARO) 5 MG/0.5ML Pen, Inject 5 mg into the skin once a week., Disp: 6 mL, Rfl: 0   ALPRAZolam (XANAX) 0.5 MG tablet, Take 1 tablet (0.5 mg total) by mouth 2 (two) times daily as needed for anxiety. Must last 15 days., Disp: 30 tablet, Rfl: 5   isosorbide mononitrate (IMDUR) 30 MG 24 hr tablet, Take 0.5 tablets (15 mg total) by mouth daily., Disp: 45 tablet, Rfl: 3   nitroGLYCERIN (NITROSTAT) 0.4 MG SL tablet, Place 1 tablet (0.4 mg total) under the tongue every 5 (five) minutes as needed. (Patient not taking: Reported on 10/04/2023), Disp: 25 tablet, Rfl: 3   potassium chloride SA (KLOR-CON M20) 20 MEQ tablet, Take 1 tablet (20 mEq total) by mouth 2 (two) times daily., Disp: 90 tablet, Rfl: 3   pregabalin (LYRICA) 75 MG capsule, 1 po at bedtime for 1 week, then increase to 1 po bid., Disp: 60 capsule, Rfl: 0   QUEtiapine (SEROQUEL) 300 MG tablet, Take 1 tablet (300 mg total) by mouth at bedtime., Disp: 30 tablet, Rfl: 5   sertraline (ZOLOFT) 100 MG tablet, Take 1 tablet (100 mg total) by mouth daily., Disp: 30 tablet, Rfl: 5   traZODone (DESYREL) 50 MG tablet, Take 0.5-2 tablets (25-100 mg total) by mouth at bedtime as needed for sleep. (Patient not taking: Reported on 10/04/2023), Disp: 60 tablet, Rfl: 0   ursodiol (ACTIGALL) 300 MG capsule, Take by mouth., Disp: , Rfl:  Medication Side Effects: none  Family Medical/ Social History: Changes?  no  MENTAL HEALTH EXAM:  There were no vitals taken for this visit.There is no height or weight on file to calculate BMI.  General Appearance: Casual and Well Groomed  Eye Contact:  Good  Speech:  Clear and Coherent and Normal Rate  Volume:  Normal  Mood:  Euthymic  Affect:  Congruent  Thought Process:  Goal Directed and Descriptions of Associations: Circumstantial  Orientation:  Full (Time, Place, and Person)  Thought Content: Logical   Suicidal Thoughts:  No  Homicidal Thoughts:  No  Memory:   at her baseline  Judgement:  Good   Insight:  Good  Psychomotor Activity:  Normal  Concentration:  Concentration: Good and Attention Span: Good  Recall:  Good  Fund of Knowledge: Good  Language: Good  Assets:  Communication Skills Desire for Improvement Financial Resources/Insurance Housing Resilience Social Support Transportation   ADL's:  Intact  Cognition: WNL  Prognosis:  Good   DIAGNOSES:    ICD-10-CM   1. Generalized anxiety disorder  F41.1     2. Insomnia, unspecified type  G47.00     3. Restless legs syndrome (RLS)  G25.81     4. Bipolar I disorder (HCC)  F31.9      Receiving Psychotherapy: No   RECOMMENDATIONS:  PDMP reviewed.  Xanax filled 09/28/2023. I provided 20 minutes of face to face time during this encounter, including time spent before and after the visit in records review, medical decision making, counseling pertinent to today's visit, and charting.   From a mental health perspective, she's doing well so no changes need to be made.   Continue Xanax 0.5 mg, 1 p.o. twice daily as needed.  DON'T FILL EARLY. EVER.  Continue pramipexole 1 mg, 1 p.o. nightly. Continue ropinirole 1 mg, 1 p.o. twice daily. Continue Zoloft 100 mg p.o. daily. Continue trazodone 50 mg, up to 2 p.o. nightly as needed sleep. Strongly recommend counseling. Return in 2 months.  Melony Overly, PA-C

## 2023-10-07 ENCOUNTER — Telehealth: Payer: Self-pay | Admitting: Pharmacy Technician

## 2023-10-07 ENCOUNTER — Encounter: Payer: Self-pay | Admitting: Oncology

## 2023-10-07 ENCOUNTER — Ambulatory Visit (INDEPENDENT_AMBULATORY_CARE_PROVIDER_SITE_OTHER): Payer: Medicare Other

## 2023-10-07 ENCOUNTER — Encounter: Payer: Self-pay | Admitting: Gastroenterology

## 2023-10-07 VITALS — BP 111/70 | HR 73 | Temp 98.2°F | Resp 16 | Ht 60.0 in | Wt 119.4 lb

## 2023-10-07 DIAGNOSIS — K5100 Ulcerative (chronic) pancolitis without complications: Secondary | ICD-10-CM | POA: Diagnosis not present

## 2023-10-07 MED ORDER — VEDOLIZUMAB 300 MG IV SOLR
300.0000 mg | Freq: Once | INTRAVENOUS | Status: AC
  Administered 2023-10-07: 300 mg via INTRAVENOUS
  Filled 2023-10-07: qty 5

## 2023-10-07 NOTE — Progress Notes (Signed)
 Diagnosis: Pancolitis  Provider:  Chilton Greathouse MD  Procedure: IV Infusion  IV Type: Peripheral, IV Location: R Antecubital  Entyvio (Vedolizumab), Dose: 300 mg  Infusion Start Time: 1057  Infusion Stop Time: 1134  Post Infusion IV Care: Peripheral IV Discontinued  Discharge: Condition: Good, Destination: Home . AVS Provided  Performed by:  Adriana Mccallum, RN

## 2023-10-07 NOTE — Telephone Encounter (Addendum)
 Entyvio  Free Drug:  ENTYVIO  CONNE[facility] APPROVED 09/28/23 - 07/29/25

## 2023-10-09 ENCOUNTER — Telehealth: Payer: Self-pay

## 2023-10-09 ENCOUNTER — Ambulatory Visit (INDEPENDENT_AMBULATORY_CARE_PROVIDER_SITE_OTHER): Admitting: Podiatry

## 2023-10-09 ENCOUNTER — Encounter: Payer: Self-pay | Admitting: Podiatry

## 2023-10-09 DIAGNOSIS — L03032 Cellulitis of left toe: Secondary | ICD-10-CM

## 2023-10-09 NOTE — Telephone Encounter (Signed)
 LVM and my chart message

## 2023-10-09 NOTE — Progress Notes (Signed)
  Subjective:  Patient ID: Amy Hansen, female    DOB: 01-Jun-1956,  MRN: [id number]  Chief Complaint  Patient presents with   Nail Problem    "I think it's doing well."    68 y.o. female presents with the above complaint. History confirmed with patient.  She returns for follow-up and is doing much better  Objective:  Physical Exam: warm, good capillary refill, no trophic changes or ulcerative lesions, normal DP and PT pulses, normal sensory exam, and ingrown nail left hallux medial border healing well.  Assessment:   1. Paronychia of great toe of left foot      Plan:  Patient was evaluated and treated and all questions answered.    Ingrown Nail, left -May leave open to air and discontinue soaks and ointment at this point.  No further antibiotic therapy needed.  Follow-up with me as needed if there is recurrence.  She would like to return for follow-up for diabetic footcare and this will be scheduled with Dr. Eloy End.  Return if symptoms worsen or fail to improve.

## 2023-10-10 DIAGNOSIS — M25561 Pain in right knee: Secondary | ICD-10-CM | POA: Diagnosis not present

## 2023-10-11 DIAGNOSIS — R748 Abnormal levels of other serum enzymes: Secondary | ICD-10-CM | POA: Diagnosis not present

## 2023-10-16 NOTE — Progress Notes (Unsigned)
 Cardiology Office Note    Date:  10/17/2023   ID:  Amy Hansen, Amy Hansen [birth year], MRN [id number]  PCP:  [name], NP  Cardiologist:  Yvonne Kendall, MD  Electrophysiologist:  None   Chief Complaint: Follow up  History of Present Illness:   Amy Hansen is a 68 y.o. female with history of CAD status post recent PCI/DES to OM1 in 04/2023, HFpEF, PSVT, CVA/TIA, DM2, anemia, indeterminate colitis, elevated LFTs, RLS, hiatal hernia, anxiety, depression, and GERD who presents for follow-up of CAD and HFpEF.   She was previously followed by Dr. Alvino Chapel with echo in 07/2016 showing an EF of 55 to 60%, no regional wall motion abnormalities, grade 1 diastolic dysfunction, mild mitral regurgitation, and a mildly dilated left atrium.  Treadmill MPI in 08/2016 showed no significant ischemia with adequate exercise tolerance, and was overall low risk.  Zio patch in 07/2019 showed a predominant rhythm of sinus with an average rate of 101 bpm, a single episode of NSVT lasting 9 beats, 20 episodes of SVT lasting up to 11 beats, and rare PACs/PVCs.  Patient triggered events corresponded to sinus rhythm with PACs.  Echo from 03/2021 demonstrated an EF of 60 to 65%, no regional wall motion abnormalities, grade 1 diastolic dysfunction, normal RV systolic function and ventricular cavity size, mild aortic valve sclerosis without evidence of stenosis, and an estimated right atrial pressure of 3 mmHg.      Prior MRI of the brain in 2022 demonstrated small chronic cortical/subcortical infarct within the left parietal lobe as well as a small chronic lacunar infarcts within the bilateral basal ganglia and changes consistent with chronic small vessel ischemia.     She was seen in the office in 05/2022 and was without symptoms of angina or decompensation.  She did report a 1 week history of exertional shortness of breath when going up or down a flight of stairs.  She also reported an episode during the summer where she  woke up on the floor after a lamp fell on her.  At that time, CT of the head and MRI of the face showed no acute process.  It was unclear if this was associated with a syncopal episode or not.  There was no cardiac pro or postdrome.  Zio patch in 05/2022 showed a predominant rhythm of sinus with an average rate of 103 bpm (range 74 to 150 bpm in sinus), single episode of NSVT occurred lasting 4 beats with a maximum rate of 222 bpm, 11 episodes of SVT lasting up to 7 beats with a maximum rate of 148 bpm, rare PACs and PVCs, and no sustained arrhythmias or prolonged pauses.  Patient triggered events corresponded to sinus rhythm and blocked PAC.   She was seen at outside ED on 07/16/2022 with intermittent confusion and difficulty saying specific words.  CT of the head showed no acute intracranial abnormality.  High-sensitivity troponin negative.  She followed up with PCP and was found to have UTI.  Subsequent MRI of the brain in 09/2022 showed no evidence of acute intracranial abnormality with redemonstrated tiny chronic cortical infarct within the left parietal lobe, redemonstrated tiny chronic lacunar infarct within the right thalamus, and similar appearance of likely chronic small vessel ischemia.   Echo on 07/24/2022 demonstrated an EF of 50 to 55%, no regional wall motion abnormalities, mild LVH, grade 1 diastolic dysfunction, normal RV systolic function and ventricular cavity size, mildly dilated left atrium, and trivial mitral regurgitation.   She was seen  in the office in 07/2022 and continued to report episodes of transient confusion associated with dysarthria without weakness.  CTA head and neck in 11/2022 showed no acute intracranial finding with chronic small vessel ischemic changes.  No intracranial large vessel occlusion or proximal stenosis.  No suspected significant carotid artery stenosis with aortic atherosclerosis noted.   She was admitted to the hospital in 01/2023 with flank pain found to have  pan positive review of systems.  She was treated for AKI presumed to be secondary to dehydration in the setting of dysphagia with nausea and vomiting.  Found to have large amount of stool burden on CT which was likely contributing to her nausea and vomiting.  GI was consulted with very low suspicion for underlying organic GI disorder with symptoms felt to be related to medication side effects and anxiety.  From a CHF perspective, she appeared compensated.  High-sensitivity troponin trended to 51.   She was seen in the office in 02/2023 noting increase in lower extremity edema despite taking 40 mg of furosemide for several weeks.  Her weight was down 22 pounds by our scale when compared to her visit in 07/2022.  She was advised to take furosemide 20 mg twice daily.  Echo on 04/10/2023 showed an EF of 60 to 65%, no regional wall motion abnormalities, grade 1 diastolic dysfunction, normal RV systolic function and ventricular cavity size, mild to moderate mitral regurgitation, moderate mitral annular calcification, mild to moderate tricuspid regurgitation, and an estimated right atrial pressure of 3 mmHg.   She was seen in the office on 04/11/2023 noting intermittent episodes of pain under the left breast that radiated to the left arm.  She was without symptoms of dyspnea and continue to note waxing and waning edema in her lower extremities.  Her weight has been relatively stable.  In the setting of palpitations noted off metoprolol, this was resumed.  Subsequent coronary CTA on 05/02/2023 showed a calcium score of 29.3 which was the 68th percentile.  There was less than 25% LCx and RCA stenosis with severe stenosis estimated at greater than 70% of proximal OM1.  ctFFR of 0.77 in the ostial OM1 with normal ctFFR of the LAD and RCA.  LHC on 05/15/2023 showed severe single-vessel CAD with 90% stenosis of proximal OM1.  There was mild to moderate disease involving the LAD and RCA as outlined below.  Sluggish flow in the  distal LAD on initial images resolved with intracoronary nitroglycerin suggesting some element of coronary vasospasm.  Normal LVEF estimated at 55 to 65% with upper normal filling pressure with an LVEDP of 15 mmHg.  She underwent successful PCI/DES to OM1.  Following cardiac cath that she was started on Imdur 15 mg daily in the setting of 2 episodes of chest discomfort that prompted her to take SL NTG.  Previously noted ankle swelling had improved from prior visits.   She was seen in our office on 06/19/2023 noting ongoing lower extremity swelling and weight gain of 5 pounds by our scale when compared to her visit earlier in the month.  She had recently been started on pregabalin by neurology.  She was taking furosemide 20 mg twice daily.  She continued to note sporadic chest pain that would last only few seconds in duration that was felt to be unlikely related to coronary insufficiency.  She seemed to be tolerating Imdur and was overall feeling better since undergoing PCI to OM1 in 04/2023.  She was volume overloaded with recommendation to increase  furosemide to 40 mg twice daily and replete potassium.  With discontinuation of pregabalin the lower extremity swelling improved.   She was admitted to the hospital in late 05/2023 with hyperosmolar nonketotic hyperglycemia with blood glucose reading greater than 600 and treated with insulin drip.  She was seen in the ED on 07/01/2023 with continued hyperglycemia with a glucose of 433, treated with IV fluids and addition of short acting sliding scale insulin.  Management of her diabetes has been challenging with irregular eating habits and with prior self discontinuation of Mounjaro.  She was most recently seen in the office in 08/2023 noting recurrence in lower extremity swelling following the reinitiation of pregabalin.  She had also increased her furosemide to 40 mg twice daily in this setting.  It was recommended she discontinue pregabalin with subsequent  improvement in lower extremity swelling noted.  She reduced her furosemide back down to 20 mg twice daily.  She comes into the office today and is doing well from a cardiac perspective, without symptoms of angina or cardiac decompensation.  Weight remains stable around 120 pounds.  She does add salt to food and consumes pretzels on a regular basis.  No progressive orthopnea.  She is noting some early satiety, though continues to deal with GI upset.  She notes mild intermittent ankle swelling.  She is undergoing further evaluation for abnormal LFT through [facility] and reports that she may ultimately need liver biopsy down the road.  She also notes some intermittent BRBPR and has follow-up with GI next month.  Denies melena.  Adherent to DAPT.    Labs independently reviewed: 08/2023 - albumin 4.4, AST 70, AST 67 07/2023 - Hgb 11.7, PLT 243, potassium 3.6, BUN 9, serum creatinine 0.69, albumin 4.0, AST 47, ALT 113, TSH normal 05/2023 - TC 92, TG 96, HDL 47, LDL 27  Past Medical History:  Diagnosis Date   Acute on chronic heart failure with preserved ejection fraction (HFpEF) (HCC) 05/16/2018   AKI (acute kidney injury) (HCC) 02/05/2023   Anemia    Anxiety    Arthritis    Blood transfusion without reported diagnosis    Cataract    CHF (congestive heart failure) (HCC)    Chronic kidney disease    UTI, hematuria in urine   Colitis    Crohn's disease (HCC)    Depression    Diabetes (HCC)    Diverticulosis    Frequent headaches    Interstitial cystitis    Recurrent UTI    Restless leg syndrome    TIA (transient ischemic attack) 02/20/2021   Urinary frequency     Past Surgical History:  Procedure Laterality Date   bariatric bypass  2012   BIOPSY  05/03/2020   Procedure: BIOPSY;  Surgeon: Rachael Fee, MD;  Location: [facility] ENDOSCOPY;  Service: Endoscopy;;   CARPAL TUNNEL RELEASE Right 2003   CARPAL TUNNEL RELEASE Right    2008   CHOLECYSTECTOMY  1975   COLONOSCOPY      COLONOSCOPY WITH PROPOFOL N/A 05/03/2020   Procedure: COLONOSCOPY WITH PROPOFOL;  Surgeon: Rachael Fee, MD;  Location: [facility] ENDOSCOPY;  Service: Endoscopy;  Laterality: N/A;   CORONARY STENT INTERVENTION N/A 05/15/2023   Procedure: CORONARY STENT INTERVENTION;  Surgeon: Yvonne Kendall, MD;  Location: [facility] INVASIVE CV LAB;  Service: Cardiovascular;  Laterality: N/A;   CYSTOSCOPY W/ RETROGRADES Bilateral 06/06/2015   Procedure: CYSTOSCOPY WITH RETROGRADE PYELOGRAM;  Surgeon: Jerilee Field, MD;  Location: [facility] ORS;  Service: Urology;  Laterality: Bilateral;  EYE SURGERY Left 07/06/2022   FL INJ LEFT KNEE CT ARTHROGRAM ([facility] HX) Left    1995   GASTRIC BYPASS  2010   HAND SURGERY Left 01/19/2021   Thumb   HEMORRHOID SURGERY  2013   KNEE ARTHROSCOPY Left 1996   LEFT HEART CATH AND CORONARY ANGIOGRAPHY Left 05/15/2023   Procedure: LEFT HEART CATH AND CORONARY ANGIOGRAPHY;  Surgeon: Yvonne Kendall, MD;  Location: [facility] INVASIVE CV LAB;  Service: Cardiovascular;  Laterality: Left;   TONSILLECTOMY     TOTAL ABDOMINAL HYSTERECTOMY W/ BILATERAL SALPINGOOPHORECTOMY      Current Medications: Current Meds  Medication Sig   ALPRAZolam (XANAX) 0.5 MG tablet Take 1 tablet (0.5 mg total) by mouth 2 (two) times daily as needed for anxiety. Must last 15 days.   amoxicillin-clavulanate (AUGMENTIN) 875-125 MG tablet Take 1 tablet by mouth 2 (two) times daily.   aspirin EC 81 MG tablet Take 1 tablet (81 mg) by mouth once daily. Swallow whole.   cholecalciferol (CHOLECALCIFEROL) 25 MCG tablet Take 1 tablet (1,000 Units total) by mouth daily.   clopidogrel (PLAVIX) 75 MG tablet Take 1 tablet (75 mg total) by mouth daily with breakfast.   estradiol (ESTRACE) 0.1 MG/GM vaginal cream Apply one pea-sized amount around the opening of the urethra 3 times weekly.   furosemide (LASIX) 20 MG tablet Take 1 tablet (20 mg total) by mouth daily as needed (for swelling in the legs accompanied by a weight gain of 3  pounds overnight -- USE SPARINGLY).   furosemide (LASIX) 40 MG tablet Take 40 mg by mouth 2 (two) times daily.   insulin aspart (NOVOLOG FLEXPEN) 100 UNIT/ML FlexPen Inject 1 Units into the skin 3 (three) times daily with meals. Based on premeal blood sugar: 120-150- 1 unit 151-200- 2 units 201-250- 3 units 251-300- 4 units 301-350- 5 units 351-400- 6 units >400, take 6 units and repeat CBG in 1 hour   insulin glargine (LANTUS) 100 UNIT/ML Solostar Pen Inject 17 Units into the skin daily.   Insulin Pen Needle (PEN NEEDLES 3/16") 31G X 5 MM MISC 1 Units by Does not apply route as needed.   nitroGLYCERIN (NITROSTAT) 0.4 MG SL tablet Place 1 tablet (0.4 mg total) under the tongue every 5 (five) minutes as needed.   ondansetron (ZOFRAN-ODT) 8 MG disintegrating tablet Take 1 tablet (8 mg total) by mouth every 8 (eight) hours as needed for nausea or vomiting.   pantoprazole (PROTONIX) 40 MG tablet Take 1 tablet (40 mg total) by mouth daily.   pramipexole (MIRAPEX) 1 MG tablet Take 1 tablet (1 mg total) by mouth at bedtime.   pregabalin (LYRICA) 75 MG capsule 1 po at bedtime for 1 week, then increase to 1 po bid.   QUEtiapine (SEROQUEL) 300 MG tablet Take 1 tablet (300 mg total) by mouth at bedtime.   rOPINIRole (REQUIP) 1 MG tablet Take 1 mg by mouth 2 (two) times daily.   rosuvastatin (CRESTOR) 5 MG tablet TAKE 1 TABLET DAILY   sertraline (ZOLOFT) 100 MG tablet Take 1 tablet (100 mg total) by mouth daily.   tirzepatide [facility]) 5 MG/0.5ML Pen Inject 5 mg into the skin once a week.   traZODone (DESYREL) 50 MG tablet Take 0.5-2 tablets (25-100 mg total) by mouth at bedtime as needed for sleep.   ursodiol (ACTIGALL) 300 MG capsule Take by mouth.    Allergies:   Avelox [moxifloxacin hcl in nacl], Bactrim [sulfamethoxazole-trimethoprim], Ciprofloxacin, Buspar [buspirone], Neomycin-polymyxin-dexameth, Aquaphor [lanolin-petrolatum], Depakote [divalproex sodium], Imitrex [sumatriptan],  and Stadol  [butorphanol]   Social History   Socioeconomic History   Marital status: Married    Spouse name: Not on file   Number of children: Not on file   Years of education: Not on file   Highest education level: Some college, no degree  Occupational History   Not on file  Tobacco Use   Smoking status: Former    Current packs/day: 0.00    Types: Cigarettes    Quit date: 04/25/1975    Years since quitting: 48.5   Smokeless tobacco: Never   Tobacco comments:    quit 40 years ago  Vaping Use   Vaping status: Never Used  Substance and Sexual Activity   Alcohol use: No    Alcohol/week: 0.0 standard drinks of alcohol   Drug use: No   Sexual activity: Not Currently    Birth control/protection: Post-menopausal, Surgical  Other Topics Concern   Not on file  Social History Narrative   Caffeine 5 servings per day.   Social Drivers of Health   Financial Resource Strain: Medium Risk (07/09/2023)   Overall Financial Resource Strain (CARDIA)    Difficulty of Paying Living Expenses: Somewhat hard  Food Insecurity: Low Risk  (09/25/2023)   Received from [facility]   Hunger Vital Sign    Within the past 12 months, you worried that your food would run out before you got money to buy more: Not on file    Ran Out of Food in the Last Year: Never true  Transportation Needs: No Transportation Needs (09/25/2023)   Received from [facility]    In the past 12 months, has lack of reliable transportation kept you from medical appointments, meetings, work or from getting things needed for daily living? : No  Recent Concern: Transportation Needs - Unmet Transportation Needs (07/09/2023)   PRAPARE - Transportation    Lack of Transportation (Medical): Yes    Lack of Transportation (Non-Medical): Patient declined  Physical Activity: Insufficiently Active (07/09/2023)   Exercise Vital Sign    Days of Exercise per Week: 3 days    Minutes of Exercise per Session: 20 min  Stress: Stress  Concern Present (07/09/2023)   [employer] of Occupational Health - Occupational Stress Questionnaire    Feeling of Stress : Very much  Social Connections: Socially Integrated (07/09/2023)   Social Connection and Isolation Panel [NHANES]    Frequency of Communication with Friends and Family: More than three times a week    Frequency of Social Gatherings with Friends and Family: Patient declined    Attends Religious Services: More than 4 times per year    Active Member of [employer] or Organizations: Yes    Attends Engineer, structural: More than 4 times per year    Marital Status: Married     Family History:  The patient's family history includes Colon cancer in her mother; Heart failure in her sister; Stroke in her father. There is no history of Bladder Cancer, Kidney disease, Prostate cancer, Kidney cancer, Pancreatic cancer, Esophageal cancer, Stomach cancer, Rectal cancer, or Breast cancer.  ROS:   12-point review of systems is negative unless otherwise noted in the HPI.   EKGs/Labs/Other Studies Reviewed:    Studies reviewed were summarized above. The additional studies were reviewed today:  LHC 05/15/2023: Conclusions: Severe single-vessel coronary artery disease with 90% stenosis of proximal OM1.  There is mild-moderate disease involving the LAD and RCA.  Sluggish flow in the distal LAD on initial  images resolved with intracoronary nitroglycerin suggesting some element of coronary vasospasm. Normal left ventricular systolic function (LVEF 55-65%) with upper normal filling pressure (LVEDP 15 mmHg). Successful PCI to OM1 using Onyx Frontier 2.25 x 18 mm drug-eluting stent (postdilated to 2.6 mm) with 0% residual stenosis and TIMI-3 flow. Small right radial artery necessitating use of 65F sheath/catheters.  Consider alternative access for future catheterizations, particularly if larger catheters are necessary.   Recommendations: Overnight observation. Dual antiplatelet  therapy with aspirin and clopidogrel for at least 6 months. Aggressive secondary prevention of coronary artery disease.  Will continue low-dose rosuvastatin given very low LDL and concern for myopathy with higher intensity statin therapy. __________   Coronary 05/02/2023: FINDINGS: Aorta: Normal size. Mild aortic wall calcifications. No dissection.   Aortic Valve:  Trileaflet.  No calcifications.   Coronary Arteries:  Normal coronary origin.  Right dominance.   RCA is a dominant artery. There is calcified plaque proximally causing minimal stenosis (<25%).   Left main gives rise to LAD and LCX arteries. LM has no disease.   LAD has no plaque.   LCX is a non-dominant artery. Minimal proximal LCx stenosis (<25%). There is non calcified plaque in the proximal OM1 causing severe stenosis (>70%).   Other findings:   Normal pulmonary vein drainage into the left atrium.   Normal left atrial appendage without a thrombus.   Normal size of the pulmonary artery.   IMPRESSION: 1. Coronary calcium score of 29.3. This was 68th percentile for age and sex matched control. 2. Normal coronary origin with right dominance. 3. Severe stenosis of proximal OM1 branch (>70%). 4. Minimal RCA and LCx stenosis (<25%) 5. CAD-RADS 4 Severe stenosis. (70-99% or > 50% left main). Cardiac catheterization is recommended. Consider symptom-guided anti-ischemic pharmacotherapy as well as risk factor modification per guideline directed care. 6. Additional analysis with CT FFR will be submitted and reported separately.     crFFR: 1. Left Main:  No significant stenosis.   2. LAD: No significant stenosis.  FFRct 0.95 3. LCX: significant stenosis in ostial OM1. FFRct 0.77 4. RCA: No significant stenosis.  FFRct 0.92   IMPRESSION: 1. CT FFR analysis showed significant stenosis in the proximal first obtuse marginal branch (OM1). FFRct 0.77 2.  Cardiac catheterization recommended. __________   2D echo  04/10/2023: 1. Left ventricular ejection fraction, by estimation, is 60 to 65%. The  left ventricle has normal function. The left ventricle has no regional  wall motion abnormalities. Left ventricular diastolic parameters are  consistent with Grade I diastolic  dysfunction (impaired relaxation).   2. Right ventricular systolic function is normal. The right ventricular  size is normal. Tricuspid regurgitation signal is inadequate for assessing  PA pressure.   3. The mitral valve is normal in structure. Mild to moderate mitral valve  regurgitation. No evidence of mitral stenosis. Moderate mitral annular  calcification.   4. Tricuspid valve regurgitation is mild to moderate.   5. The aortic valve is tricuspid. Aortic valve regurgitation is not  visualized. No aortic stenosis is present.   6. The inferior vena cava is normal in size with greater than 50%  respiratory variability, suggesting right atrial pressure of 3 mmHg.  __________   2D echo 07/24/2022: 1. Left ventricular ejection fraction, by estimation, is 50 to 55%. Left  ventricular ejection fraction by 2D MOD biplane is 53.8 %. The left  ventricle has low normal function. The left ventricle has no regional wall  motion abnormalities. There is mild left ventricular  hypertrophy. Left ventricular diastolic parameters are consistent with Grade I diastolic dysfunction (impaired relaxation). The average left ventricular global longitudinal strain is -16.8 %.   2. Right ventricular systolic function is normal. The right ventricular size is normal.   3. Left atrial size was mildly dilated.   4. The mitral valve is normal in structure. Trivial mitral valve regurgitation.   5. The aortic valve is tricuspid. Aortic valve regurgitation is not visualized. __________    Luci Bank patch 05/2022:   The patient was monitored for 13 days, 23 hours.   The predominant rhythm was sinus with an average rate of 103 bpm (range 74-150 bpm in sinus).   There  were rare PAC's and PVC's.   A single episode of nonsustained ventricular tachycardia occurred, lasting 4 beats with a maximum rate of 222 bpm.   There were 11 supraventricular runs, lasting up to 7 beats with a maximum rate of 148 bpm.   No sustained arrhythmia or prolonged pause was observed.   Patient triggered events correspond to sinus rhythm and blocked PAC.   Predominantly sinus rhythm with elevated average heart rate.  Rare PAC's and PVC's were observed, as well as a few brief episodes of NSVT and PSVT, as detailed above. __________   2D echo 04/20/2021: 1. Left ventricular ejection fraction, by estimation, is 60 to 65%. Left  ventricular ejection fraction by 2D MOD biplane is 64.3 %. The left  ventricle has normal function. The left ventricle has no regional wall  motion abnormalities. Left ventricular  diastolic parameters are consistent with Grade I diastolic dysfunction  (impaired relaxation).   2. Right ventricular systolic function is normal. The right ventricular  size is normal.   3. The mitral valve is grossly normal. No evidence of mitral valve  regurgitation.   4. The aortic valve is tricuspid. Aortic valve regurgitation is not  visualized. Mild aortic valve sclerosis is present, with no evidence of  aortic valve stenosis.   5. The inferior vena cava is normal in size with greater than 50%  respiratory variability, suggesting right atrial pressure of 3 mmHg.   Comparison(s): LVEF 55-60%.  __________   Luci Bank patch 07/2019: The patient was monitored for 12 days, 14 hours. The predominant rhythm was sinus with an average rate of 101 bpm (range 66 to 150 bpm in sinus). Rare PACs and PVCs were noted. A single episode of nonsustained ventricular tachycardia lasting nine beats occurred, with a maximum rate of 152 bpm. There were 20 episodes of supraventricular tachycardia lasting up to 11 beats with a maximal rate of 176 bpm. No sustained arrhythmia or prolonged pause was  observed. Patient triggered events correspond to sinus rhythm with PACs.   Predominantly sinus rhythm with rare PACs and PVCs as well as brief PSVT and NSVT.  Patient triggered events correspond to sinus rhythm with PACs. __________   Treadmill MPI 08/30/2016: Exercise  myocardial perfusion imaging study with no significant  ischemia Normal wall motion, EF estimated at 50% No EKG changes concerning for ischemia at peak stress or in recovery. Target heart rate achieved Adequate exercise tolerance, exercised for 5:50 min Low risk scan __________   2D echo 08/23/2016: - Left ventricle: The cavity size was normal. Wall thickness was    normal. Systolic function was normal. The estimated ejection    fraction was in the range of 55% to 60%. Wall motion was normal;    there were no regional wall motion abnormalities. Doppler    parameters are consistent  with abnormal left ventricular    relaxation (grade 1 diastolic dysfunction).  - Mitral valve: There was mild regurgitation.  - Left atrium: The atrium was mildly dilated.    EKG:  EKG is not ordered today.    Recent Labs: 02/10/2023: Magnesium 2.1 08/23/2023: ALT 113; TSH 1.430 08/24/2023: B Natriuretic Peptide 28.9; BUN 9; Creatinine, Ser 0.69; Hemoglobin 11.7; Platelets 243; Potassium 3.6; Sodium 134  Recent Lipid Panel    Component Value Date/Time   CHOL 92 (L) 06/18/2023 1337   CHOL 139 07/17/2016 1408   CHOL 148 01/10/2012 0717   TRIG 96 06/18/2023 1337   TRIG 99 07/17/2016 1408   TRIG 110 01/10/2012 0717   HDL 47 06/18/2023 1337   HDL 47 01/10/2012 0717   CHOLHDL 2.0 06/18/2023 1337   CHOLHDL 2.0 04/26/2020 0339   VLDL 13 04/26/2020 0339   VLDL 20 07/17/2016 1408   VLDL 22 01/10/2012 0717   LDLCALC 27 06/18/2023 1337   LDLCALC 79 01/10/2012 0717    PHYSICAL EXAM:    VS:  BP 90/68   Pulse 85   Ht 5' (1.524 m)   Wt 122 lb 3.2 oz (55.4 kg)   LMP  (LMP Unknown)   SpO2 98%   BMI 23.87 kg/m   BMI: Body mass index is  23.87 kg/m.  Physical Exam Vitals reviewed.  Constitutional:      Appearance: She is well-developed.  HENT:     Head: Normocephalic and atraumatic.  Eyes:     General:        Right eye: No discharge.        Left eye: No discharge.  Cardiovascular:     Rate and Rhythm: Normal rate and regular rhythm.     Heart sounds: Normal heart sounds, S1 normal and S2 normal. Heart sounds not distant. No midsystolic click and no opening snap. No murmur heard.    No friction rub.  Pulmonary:     Effort: Pulmonary effort is normal. No respiratory distress.     Breath sounds: Normal breath sounds. No decreased breath sounds, wheezing, rhonchi or rales.  Chest:     Chest wall: No tenderness.  Musculoskeletal:     Cervical back: Normal range of motion.     Right lower leg: No edema.     Left lower leg: No edema.  Skin:    General: Skin is warm and dry.     Nails: There is no clubbing.  Neurological:     Mental Status: She is alert and oriented to person, place, and time.  Psychiatric:        Speech: Speech normal.        Behavior: Behavior normal.        Thought Content: Thought content normal.        Judgment: Judgment normal.     Wt Readings from Last 3 Encounters:  10/17/23 122 lb 3.2 oz (55.4 kg)  10/07/23 119 lb 6.4 oz (54.2 kg)  09/17/23 120 lb 8 oz (54.7 kg)     ASSESSMENT & PLAN:   CAD involving the native coronary arteries without angina: She is without symptoms of angina or cardiac decompensation.  Continue aggressive risk factor modification and secondary prevention including aspirin and clopidogrel for a minimum of 6 months dating back to date of PCI (05/15/2023).  She will otherwise continue Imdur 15 mg daily and rosuvastatin 5 mg.  No indication for further ischemic testing at this time.  HFpEF: Appears euvolemic and well compensated on  furosemide 20 mg twice daily.  Okay to take an additional 20 mg of furosemide for weight gain greater than 3 pounds or increase in lower  extremity swelling.  Use sparingly.  Defer addition of MRA or SGLT2 inhibitor at this time given concern for off target effect and in the context of of multiple medication intolerances.  Her previously noted lower extremity swelling is resolved following discontinuation of Lyrica, would not rechallenge patient with this medication.  PSVT: Quiescent.  No longer on metoprolol, she self discontinued in late 2024.  History of CVA: No new deficits.  She remains on aspirin and statin therapy.  Elevated LFT: Undergoing evaluation through [facility].  If biopsy is needed, ideally would wait until 6 months from PCI (this was performed on 05/15/2023) before holding clopidogrel.  Reported hematochezia: Check CBC.  Advised patient to follow-up with GI.      Disposition: F/u with Dr. Okey Dupre or an APP in 6 months.   Medication Adjustments/Labs and Tests Ordered: Current medicines are reviewed at length with the patient today.  Concerns regarding medicines are outlined above. Medication changes, Labs and Tests ordered today are summarized above and listed in the Patient Instructions accessible in Encounters.   Signed, Eula Listen, PA-C 10/17/2023 1:02 PM     [facility] [address] [place], [place] [address] [phone]

## 2023-10-17 ENCOUNTER — Ambulatory Visit: Payer: Medicare Other | Attending: Physician Assistant | Admitting: Physician Assistant

## 2023-10-17 ENCOUNTER — Encounter: Payer: Self-pay | Admitting: Physician Assistant

## 2023-10-17 VITALS — BP 90/68 | HR 85 | Ht 60.0 in | Wt 122.2 lb

## 2023-10-17 DIAGNOSIS — K921 Melena: Secondary | ICD-10-CM

## 2023-10-17 DIAGNOSIS — I5032 Chronic diastolic (congestive) heart failure: Secondary | ICD-10-CM

## 2023-10-17 DIAGNOSIS — Z8673 Personal history of transient ischemic attack (TIA), and cerebral infarction without residual deficits: Secondary | ICD-10-CM | POA: Diagnosis not present

## 2023-10-17 DIAGNOSIS — I25810 Atherosclerosis of coronary artery bypass graft(s) without angina pectoris: Secondary | ICD-10-CM

## 2023-10-17 DIAGNOSIS — I471 Supraventricular tachycardia, unspecified: Secondary | ICD-10-CM

## 2023-10-17 DIAGNOSIS — R7989 Other specified abnormal findings of blood chemistry: Secondary | ICD-10-CM

## 2023-10-17 MED ORDER — FUROSEMIDE 20 MG PO TABS
20.0000 mg | ORAL_TABLET | Freq: Every day | ORAL | 1 refills | Status: AC | PRN
Start: 2023-10-17 — End: ?

## 2023-10-17 NOTE — Patient Instructions (Addendum)
 Medication Instructions:  Your physician recommends the following medication changes.  START TAKING: Lasix 20 mg as needed for swelling in the legs accompanied by a weight gain of 3 pounds overnight -- USE SPARINGLY  *If you need a refill on your cardiac medications before your next appointment, please call your pharmacy*   Lab Work: Your provider would like for you to have following labs drawn today CBC.   If you have labs (blood work) drawn today and your tests are completely normal, you will receive your results only by: MyChart Message (if you have MyChart) OR A paper copy in the mail If you have any lab test that is abnormal or we need to change your treatment, we will call you to review the results.     Follow-Up: At [facility], you and your health needs are our priority.  As part of our continuing mission to provide you with exceptional heart care, we have created designated Provider Care Teams.  These Care Teams include your primary Cardiologist (physician) and Advanced Practice Providers (APPs -  Physician Assistants and Nurse Practitioners) who all work together to provide you with the care you need, when you need it.    Your next appointment:   6 month(s)  Provider:   You may see Yvonne Kendall, MD or Eula Listen, PA-C

## 2023-10-18 ENCOUNTER — Encounter: Payer: Self-pay | Admitting: Gastroenterology

## 2023-10-18 ENCOUNTER — Encounter: Payer: Self-pay | Admitting: Oncology

## 2023-10-18 LAB — CBC
Hematocrit: 35.9 % (ref 34.0–46.6)
Hemoglobin: 11.2 g/dL (ref 11.1–15.9)
MCH: 26.6 pg (ref 26.6–33.0)
MCHC: 31.2 g/dL — ABNORMAL LOW (ref 31.5–35.7)
MCV: 85 fL (ref 79–97)
Platelets: 287 10*3/uL (ref 150–450)
RBC: 4.21 x10E6/uL (ref 3.77–5.28)
RDW: 13.1 % (ref 11.7–15.4)
WBC: 5.7 10*3/uL (ref 3.4–10.8)

## 2023-10-22 ENCOUNTER — Other Ambulatory Visit: Payer: Self-pay | Admitting: Nurse Practitioner

## 2023-10-22 ENCOUNTER — Encounter: Payer: Self-pay | Admitting: Oncology

## 2023-10-22 ENCOUNTER — Encounter: Payer: Self-pay | Admitting: Gastroenterology

## 2023-10-22 ENCOUNTER — Other Ambulatory Visit (HOSPITAL_COMMUNITY): Payer: Self-pay

## 2023-10-23 ENCOUNTER — Telehealth: Payer: Self-pay | Admitting: Nurse Practitioner

## 2023-10-23 MED ORDER — ROPINIROLE HCL 1 MG PO TABS: 1.0000 mg | ORAL_TABLET | Freq: Two times a day (BID) | ORAL | 1 refills | Status: DC

## 2023-10-23 NOTE — Telephone Encounter (Signed)
 Rx 08/29/23 #90- too soon Requested Prescriptions  Pending Prescriptions Disp Refills   pramipexole (MIRAPEX) 1 MG tablet [Pharmacy Med Name: PRAMIPEXOLE 1 MG TABLET] 90 tablet 0    Sig: Take 1 tablet (1 mg total) by mouth at bedtime.     Neurology:  Parkinsonian Agents Passed - 10/23/2023  2:10 PM      Passed - Last BP in normal range    BP Readings from Last 1 Encounters:  10/17/23 90/68         Passed - Last Heart Rate in normal range    Pulse Readings from Last 1 Encounters:  10/17/23 85         Passed - Valid encounter within last 12 months    Recent Outpatient Visits           2 months ago Thyroid function test abnormal   [facility] [name], NP   3 months ago Chest pain, unspecified type   [facility] [name], NP   3 months ago Type 2 diabetes mellitus with hyperglycemia, with long-term current use of insulin [facility])   [place] [facility] Jackolyn Confer, MD   3 months ago Type 2 diabetes mellitus with hyperglycemia, with long-term current use of insulin [facility])   [place] [facility] [place] [facility] Jackolyn Confer, MD   4 months ago Chronic heart failure with preserved ejection fraction [facility])    [facility] [name], NP       Future Appointments             In 1 month [name], NP  [facility]   In 3 months MacDiarmid, Lorin Picket, MD [facility]

## 2023-10-23 NOTE — Telephone Encounter (Signed)
 Medication sent to the pharmacy.

## 2023-10-23 NOTE — Telephone Encounter (Signed)
 Requested medication (s) are due for refill today - unsure  Requested medication (s) are on the active medication list -yes  Future visit scheduled -yes  Last refill: unknown  Notes to clinic: listed as historical medication   Requested Prescriptions  Pending Prescriptions Disp Refills   rOPINIRole (REQUIP) 1 MG tablet [Pharmacy Med Name: ROPINIROLE HCL 1 MG TABLET] 180 tablet 0    Sig: TAKE 1 TABLET TWICE A DAY     Neurology:  Parkinsonian Agents Passed - 10/23/2023  2:12 PM      Passed - Last BP in normal range    BP Readings from Last 1 Encounters:  10/17/23 90/68         Passed - Last Heart Rate in normal range    Pulse Readings from Last 1 Encounters:  10/17/23 85         Passed - Valid encounter within last 12 months    Recent Outpatient Visits           2 months ago Thyroid function test abnormal   [place] [facility] [name], NP   3 months ago Chest pain, unspecified type   [place] [facility] [name], NP   3 months ago Type 2 diabetes mellitus with hyperglycemia, with long-term current use of insulin (HCC)   [place] [facility] Jackolyn Confer, MD   3 months ago Type 2 diabetes mellitus with hyperglycemia, with long-term current use of insulin [facility])   [place] [facility] Jackolyn Confer, MD   4 months ago Chronic heart failure with preserved ejection fraction [facility])   [place] [facility] [name], NP       Future Appointments             In 1 month [name], NP [place] [facility]   In 3 months MacDiarmid, Lorin Picket, MD [facility] [place]               Requested Prescriptions  Pending Prescriptions Disp Refills   rOPINIRole (REQUIP) 1 MG tablet [Pharmacy Med Name: ROPINIROLE HCL 1 MG TABLET] 180 tablet 0    Sig: TAKE 1 TABLET TWICE A DAY     Neurology:  Parkinsonian Agents Passed - 10/23/2023  2:12  PM      Passed - Last BP in normal range    BP Readings from Last 1 Encounters:  10/17/23 90/68         Passed - Last Heart Rate in normal range    Pulse Readings from Last 1 Encounters:  10/17/23 85         Passed - Valid encounter within last 12 months    Recent Outpatient Visits           2 months ago Thyroid function test abnormal   [place] [facility] [name], NP   3 months ago Chest pain, unspecified type   [place] [facility] [name], NP   3 months ago Type 2 diabetes mellitus with hyperglycemia, with long-term current use of insulin [facility])   [place] [facility] Jackolyn Confer, MD   3 months ago Type 2 diabetes mellitus with hyperglycemia, with long-term current use of insulin [facility])   [place] [facility] Jackolyn Confer, MD   4 months ago Chronic heart failure with preserved ejection fraction [facility])   [place] [facility] [name], NP  Future Appointments             In 1 month [name], NP [place] [facility]   In 3 months MacDiarmid, Lorin Picket, MD [facility]

## 2023-10-23 NOTE — Telephone Encounter (Signed)
 [place] [address] call and stated that she needs a medication refill for Ropinirole 1 mg Please advise

## 2023-10-24 ENCOUNTER — Other Ambulatory Visit: Payer: Self-pay | Admitting: Nurse Practitioner

## 2023-10-24 NOTE — Telephone Encounter (Signed)
 Requested Prescriptions  Pending Prescriptions Disp Refills   pramipexole (MIRAPEX) 1 MG tablet [Pharmacy Med Name: PRAMIPEXOLE 1 MG TABLET] 90 tablet 0    Sig: Take 1 tablet (1 mg total) by mouth at bedtime.     Neurology:  Parkinsonian Agents Passed - 10/24/2023 12:15 PM      Passed - Last BP in normal range    BP Readings from Last 1 Encounters:  10/17/23 90/68         Passed - Last Heart Rate in normal range    Pulse Readings from Last 1 Encounters:  10/17/23 85         Passed - Valid encounter within last 12 months    Recent Outpatient Visits           1 month ago Anxiety disorder, unspecified type   [place] [facility] [name], NP       Future Appointments             In 1 month [name], NP [place] [facility]   In 3 months MacDiarmid, Lorin Picket, MD [facility]

## 2023-10-25 ENCOUNTER — Telehealth: Payer: Self-pay | Admitting: Pharmacy Technician

## 2023-10-25 NOTE — Telephone Encounter (Signed)
 We received Key: [id number] in cmm notifying us to do a prior auth. I talked to the pharmacist at [facility] [address] and they said they have been filling it for 90 for 30 days but still with the prescription saying 1 a day. I archived this key since no prior auth needed and the pharmacist will fix the fill to say 90 for 90 days.

## 2023-10-31 ENCOUNTER — Ambulatory Visit: Admitting: Physician Assistant

## 2023-10-31 VITALS — BP 110/74 | HR 78

## 2023-10-31 DIAGNOSIS — N390 Urinary tract infection, site not specified: Secondary | ICD-10-CM | POA: Diagnosis not present

## 2023-10-31 DIAGNOSIS — R310 Gross hematuria: Secondary | ICD-10-CM

## 2023-10-31 DIAGNOSIS — R319 Hematuria, unspecified: Secondary | ICD-10-CM | POA: Diagnosis not present

## 2023-10-31 DIAGNOSIS — L819 Disorder of pigmentation, unspecified: Secondary | ICD-10-CM | POA: Diagnosis not present

## 2023-10-31 LAB — URINALYSIS, COMPLETE
Bilirubin, UA: NEGATIVE
Glucose, UA: NEGATIVE
Ketones, UA: NEGATIVE
Nitrite, UA: POSITIVE — AB
Protein,UA: NEGATIVE
Specific Gravity, UA: 1.015 (ref 1.005–1.030)
Urobilinogen, Ur: 0.2 mg/dL (ref 0.2–1.0)
pH, UA: 6 (ref 5.0–7.5)

## 2023-10-31 LAB — MICROSCOPIC EXAMINATION
RBC, Urine: >30 /HPF — AB (ref 0–2)
WBC, UA: >30 /HPF — AB (ref 0–5)

## 2023-10-31 MED ORDER — FLUCONAZOLE 150 MG PO TABS: 150.0000 mg | ORAL_TABLET | Freq: Once | ORAL | 0 refills | Status: AC

## 2023-10-31 MED ORDER — DOXYCYCLINE HYCLATE 100 MG PO CAPS: 100.0000 mg | ORAL_CAPSULE | Freq: Two times a day (BID) | ORAL | 0 refills | Status: AC

## 2023-10-31 NOTE — Progress Notes (Signed)
 10/31/2023 2:09 PM   Amy Hansen [birth year] [id number]  CC: Chief Complaint  Patient presents with   Follow-up   HPI: Amy Hansen is a 68 y.o. female with PMH recurrent UTI versus urinary colonization previously on suppressive Keflex, GSM on estrogen cream, IC, left renal stone, and gross hematuria with benign workup in 2024 who presents today for valuation of gross hematuria.   Today she reports about 2 weeks of gross hematuria and LLQ pain with nausea.  She denies fever, chills, vomiting, dysuria, or malodorous urine.  She has recently been under the care of Dr. Carrie Mew for elevated LFTs.  She feels that she has become increasingly tanned since last seeing him about 6 weeks ago and is concerned this could indicate jaundice.  In-office UA today positive for 3+ blood, nitrates, and 1+ leukocytes; urine microscopy with >30 WBCs/HPF, >30 RBCs/HPF, and many bacteria.  PMH: Past Medical History:  Diagnosis Date   Acute on chronic heart failure with preserved ejection fraction (HFpEF) (HCC) 05/16/2018   AKI (acute kidney injury) (HCC) 02/05/2023   Anemia    Anxiety    Arthritis    Blood transfusion without reported diagnosis    Cataract    CHF (congestive heart failure) (HCC)    Chronic kidney disease    UTI, hematuria in urine   Colitis    Crohn's disease (HCC)    Depression    Diabetes (HCC)    Diverticulosis    Frequent headaches    Interstitial cystitis    Recurrent UTI    Restless leg syndrome    TIA (transient ischemic attack) 02/20/2021   Urinary frequency     Surgical History: Past Surgical History:  Procedure Laterality Date   bariatric bypass  2012   BIOPSY  05/03/2020   Procedure: BIOPSY;  Surgeon: Rachael Fee, MD;  Location[facility] END[facility]COPY;  Service: Endoscopy;;   CARPAL TUNNEL RELEASE Right 2003   CARPAL TUNNEL RELEASE Right    2008   CHOLECYSTECTOMY  1975   COLONOSCOPY     COLONOSCOPY WITH PROPOFOL N/A 05/03/2020   Procedure: COLONOSCOPY  WITH PROPOFOL;  Surgeon: Rachael Fee, MD;  Location: [facility] END[facility]COPY;  Service: Endoscopy;  Laterality: N/A;   CORONARY STENT INTERVENTION N/A 05/15/2023   Procedure: CORONARY STENT INTERVENTION;  Surgeon: Yvonne Kendall, MD;  Location: [facility] INVASIVE CV LAB;  Service: Cardiovascular;  Laterality: N/A;   CYSTOSCOPY W/ RETROGRADES Bilateral 06/06/2015   Procedure: CYSTOSCOPY WITH RETROGRADE PYELOGRAM;  Surgeon: Jerilee Field, MD;  Location: [facility] ORS;  Service: Urology;  Laterality: Bilateral;   EYE SURGERY Left 07/06/2022   FL INJ LEFT KNEE CT ARTHROGRAM ([facility] HX) Left    1995   GASTRIC BYPASS  2010   HAND SURGERY Left 01/19/2021   Thumb   HEMORRHOID SURGERY  2013   KNEE ARTHROSCOPY Left 1996   LEFT HEART CATH AND CORONARY ANGIOGRAPHY Left 05/15/2023   Procedure: LEFT HEART CATH AND CORONARY ANGIOGRAPHY;  Surgeon: Yvonne Kendall, MD;  Location: [facility] INVASIVE CV LAB;  Service: Cardiovascular;  Laterality: Left;   TONSILLECTOMY     TOTAL ABDOMINAL HYSTERECTOMY W/ BILATERAL SALPINGOOPHORECTOMY      Home Medications:  Allergies as of 10/31/2023       Reactions   Avelox [moxifloxacin Hcl In Nacl] Anaphylaxis   Bactrim [sulfamethoxazole-trimethoprim] Anaphylaxis   Ciprofloxacin Other (See Comments)   Pt states she was told never to take this as it is in the same family as Avelox.    Buspar [buspirone]  Other (See Comments)   hallucinations   Neomycin-polymyxin-dexameth Other (See Comments)   Burning in the eyes   Aquaphor [lanolin-petrolatum] Itching, Rash   Depakote [divalproex Sodium] Other (See Comments)   Unknown Reaction   Imitrex [sumatriptan] Other (See Comments)   Neck and shoulder pain   Stadol [butorphanol] Rash        Medication List        Accurate as of October 31, 2023  2:09 PM. If you have any questions, ask your nurse or doctor.          ALPRAZolam 0.5 MG tablet Commonly known as: XANAX Take 1 tablet (0.5 mg total) by mouth 2 (two) times daily as needed  for anxiety. Must last 15 days.   amoxicillin-clavulanate 875-125 MG tablet Commonly known as: AUGMENTIN Take 1 tablet by mouth 2 (two) times daily.   aspirin EC 81 MG tablet Take 1 tablet (81 mg) by mouth once daily. Swallow whole.   clopidogrel 75 MG tablet Commonly known as: PLAVIX Take 1 tablet (75 mg total) by mouth daily with breakfast.   conjugated estrogens vaginal cream Commonly known as: PREMARIN Place vaginally.   estradiol 0.1 MG/GM vaginal cream Commonly known as: ESTRACE Apply one pea-sized amount around the opening of the urethra 3 times weekly.   furosemide 40 MG tablet Commonly known as: LASIX Take 40 mg by mouth 2 (two) times daily.   furosemide 20 MG tablet Commonly known as: LASIX Take 1 tablet (20 mg total) by mouth daily as needed (for swelling in the legs accompanied by a weight gain of 3 pounds overnight -- USE SPARINGLY).   insulin glargine 100 UNIT/ML Solostar Pen Commonly known as: LANTUS Inject 17 Units into the skin daily.   isosorbide mononitrate 30 MG 24 hr tablet Commonly known as: IMDUR Take 0.5 tablets (15 mg total) by mouth daily.   metoprolol tartrate 25 MG tablet Commonly known as: LOPRESSOR Take by mouth.   nitroGLYCERIN 0.4 MG SL tablet Commonly known as: NITROSTAT Place 1 tablet (0.4 mg total) under the tongue every 5 (five) minutes as needed.   NovoLOG FlexPen 100 UNIT/ML FlexPen Generic drug: insulin aspart Inject 1 Units into the skin 3 (three) times daily with meals. Based on premeal blood sugar: 120-150- 1 unit 151-200- 2 units 201-250- 3 units 251-300- 4 units 301-350- 5 units 351-400- 6 units >400, take 6 units and repeat CBG in 1 hour   ondansetron 8 MG disintegrating tablet Commonly known as: ZOFRAN-ODT Take 1 tablet (8 mg total) by mouth every 8 (eight) hours as needed for nausea or vomiting.   pantoprazole 40 MG tablet Commonly known as: PROTONIX Take 1 tablet (40 mg total) by mouth daily.   Pen Needles 3/16"  31G X 5 MM Misc 1 Units by Does not apply route as needed.   potassium chloride SA 20 MEQ tablet Commonly known as: Klor-Con M20 Take 1 tablet (20 mEq total) by mouth 2 (two) times daily.   pramipexole 1 MG tablet Commonly known as: MIRAPEX Take 1 tablet (1 mg total) by mouth at bedtime.   pregabalin 75 MG capsule Commonly known as: Lyrica 1 po at bedtime for 1 week, then increase to 1 po bid.   QUEtiapine 300 MG tablet Commonly known as: SEROQUEL Take 1 tablet (300 mg total) by mouth at bedtime.   rOPINIRole 1 MG tablet Commonly known as: REQUIP Take 1 tablet (1 mg total) by mouth 2 (two) times daily.   rosuvastatin 5 MG tablet Commonly  known as: CRESTOR TAKE 1 TABLET DAILY   sertraline 100 MG tablet Commonly known as: ZOLOFT Take 1 tablet (100 mg total) by mouth daily.   tirzepatide 5 MG/0.5ML Pen Commonly known as: MOUNJARO Inject 5 mg into the skin once a week.   traZODone 50 MG tablet Commonly known as: DESYREL Take 0.5-2 tablets (25-100 mg total) by mouth at bedtime as needed for sleep.   ursodiol 300 MG capsule Commonly known as: ACTIGALL Take by mouth.   vitamin D3 25 MCG tablet Commonly known as: CHOLECALCIFEROL Take 1 tablet (1,000 Units total) by mouth daily.        Allergies:  Allergies  Allergen Reactions   Avelox [Moxifloxacin Hcl In Nacl] Anaphylaxis   Bactrim [Sulfamethoxazole-Trimethoprim] Anaphylaxis   Ciprofloxacin Other (See Comments)    Pt states she was told never to take this as it is in the same family as Avelox.    Buspar [Buspirone] Other (See Comments)    hallucinations   Neomycin-Polymyxin-Dexameth Other (See Comments)    Burning in the eyes   Aquaphor [Lanolin-Petrolatum] Itching and Rash   Depakote [Divalproex Sodium] Other (See Comments)    Unknown Reaction   Imitrex [Sumatriptan] Other (See Comments)    Neck and shoulder pain   Stadol [Butorphanol] Rash    Family History: Family History  Problem Relation Age of  Onset   Colon cancer Mother    Stroke Father    Heart failure Sister    Bladder Cancer Neg Hx    Kidney disease Neg Hx    Prostate cancer Neg Hx    Kidney cancer Neg Hx    Pancreatic cancer Neg Hx    Esophageal cancer Neg Hx    Stomach cancer Neg Hx    Rectal cancer Neg Hx    Breast cancer Neg Hx     Social History:   reports that she quit smoking about 48 years ago. Her smoking use included cigarettes. She has never used smokeless tobacco. She reports that she does not drink alcohol and does not use drugs.  Physical Exam: BP 110/74   Pulse 78   LMP  (LMP Unknown)   Constitutional:  Alert and oriented, no acute distress, nontoxic appearing HEENT: [place], AT, no scleral icterus Cardiovascular: No clubbing, cyanosis, or edema Respiratory: Normal respiratory effort, no increased work of breathing Skin: No rashes, bruises or suspicious lesions; no jaundice Neurologic: Grossly intact, no focal deficits, moving all 4 extremities Psychiatric: Normal mood and affect  Laboratory Data: Results for orders placed or performed in visit on 10/31/23  Microscopic Examination   Collection Time: 10/31/23  1:57 PM   Urine  Result Value Ref Range   WBC, UA >30 (A) 0 - 5 /hpf   RBC, Urine >30 (A) 0 - 2 /hpf   Epithelial Cells (non renal) 0-10 0 - 10 /hpf   Bacteria, UA Many (A) None seen/Few  Urinalysis, Complete   Collection Time: 10/31/23  1:57 PM  Result Value Ref Range   Specific Gravity, UA 1.015 1.005 - 1.030   pH, UA 6.0 5.0 - 7.5   Color, UA Yellow Yellow   Appearance Ur Cloudy (A) Clear   Leukocytes,UA 1+ (A) Negative   Protein,UA Negative Negative/Trace   Glucose, UA Negative Negative   Ketones, UA Negative Negative   RBC, UA 3+ (A) Negative   Bilirubin, UA Negative Negative   Urobilinogen, Ur 0.2 0.2 - 1.0 mg/dL   Nitrite, UA Positive (A) Negative   Microscopic Examination See below:    *  Note: Due to a large number of results and/or encounters for the requested time period,  some results have not been displayed. A complete set of results can be found in Results Review.   Assessment & Plan:   1. Recurrent UTI (Primary) UA appears grossly infected, not unusual for her.  Will start empiric Doxy and send for culture for further evaluation.  Also sending in Diflucan per patient request, though we discussed that this could be harsh on the liver. - Urinalysis, Complete - CULTURE, URINE COMPREHENSIVE - doxycycline (VIBRAMYCIN) 100 MG capsule; Take 1 capsule (100 mg total) by mouth 2 (two) times daily for 7 days.  Dispense: 14 capsule; Refill: 0 - fluconazole (DIFLUCAN) 150 MG tablet; Take 1 tablet (150 mg total) by mouth once for 1 dose.  Dispense: 1 tablet; Refill: 0  2. Gross hematuria Will obtain renal ultrasound to evaluate for possible left hydronephrosis that would indicate an obstructing ureteral stone. - US RENAL; Future  3. Discoloration of skin I do not appreciate any jaundice or scleral icterus on physical exam today, though will check her hepatic function and contact with results. - Hepatic function panel   Return for Will call with results.  Carman Ching, PA-C  [facility]  [address] [place], [place] [address] [phone]

## 2023-11-01 LAB — HEPATIC FUNCTION PANEL
ALT: 26 IU/L (ref 0–32)
AST: 30 IU/L (ref 0–40)
Albumin: 4 g/dL (ref 3.9–4.9)
Alkaline Phosphatase: 251 IU/L — ABNORMAL HIGH (ref 44–121)
Bilirubin Total: 0.4 mg/dL (ref 0.0–1.2)
Bilirubin, Direct: 0.19 mg/dL (ref 0.00–0.40)
Total Protein: 6.6 g/dL (ref 6.0–8.5)

## 2023-11-04 ENCOUNTER — Ambulatory Visit

## 2023-11-04 LAB — CULTURE, URINE COMPREHENSIVE

## 2023-11-05 ENCOUNTER — Ambulatory Visit (INDEPENDENT_AMBULATORY_CARE_PROVIDER_SITE_OTHER)

## 2023-11-05 VITALS — BP 106/69 | HR 80 | Temp 97.8°F | Resp 16 | Ht 60.0 in | Wt 116.0 lb

## 2023-11-05 DIAGNOSIS — K5100 Ulcerative (chronic) pancolitis without complications: Secondary | ICD-10-CM

## 2023-11-05 MED ORDER — VEDOLIZUMAB 300 MG IV SOLR
300.0000 mg | Freq: Once | INTRAVENOUS | Status: AC
  Administered 2023-11-05: 300 mg via INTRAVENOUS
  Filled 2023-11-05: qty 5

## 2023-11-05 NOTE — Progress Notes (Signed)
 Diagnosis: Pancolitis  Provider:  Chilton Greathouse MD  Procedure: IV Infusion  IV Type: Peripheral, IV Location: L Antecubital  Entyvio (Vedolizumab), Dose: 360 mg  Infusion Start Time: 1351  Infusion Stop Time: 1427  Post Infusion IV Care: Peripheral IV Discontinued  Discharge: Condition: Good, Destination: Home . AVS Declined  Performed by:  Adriana Mccallum, RN

## 2023-11-06 ENCOUNTER — Ambulatory Visit
Admission: RE | Admit: 2023-11-06 | Discharge: 2023-11-06 | Disposition: A | Discharge status: Home / Self Care | Source: Ambulatory Visit | Attending: Physician Assistant | Admitting: Physician Assistant

## 2023-11-06 DIAGNOSIS — R310 Gross hematuria: Secondary | ICD-10-CM | POA: Insufficient documentation

## 2023-11-06 DIAGNOSIS — N200 Calculus of kidney: Secondary | ICD-10-CM | POA: Diagnosis not present

## 2023-11-06 DIAGNOSIS — R1032 Left lower quadrant pain: Secondary | ICD-10-CM | POA: Diagnosis not present

## 2023-11-12 ENCOUNTER — Encounter: Payer: Self-pay | Admitting: Oncology

## 2023-11-12 ENCOUNTER — Encounter: Payer: Self-pay | Admitting: Gastroenterology

## 2023-11-21 ENCOUNTER — Telehealth: Payer: Self-pay | Admitting: Cardiovascular Disease

## 2023-11-21 DIAGNOSIS — Z03818 Encounter for observation for suspected exposure to other biological agents ruled out: Secondary | ICD-10-CM | POA: Diagnosis not present

## 2023-11-21 NOTE — Telephone Encounter (Signed)
 Do not know much better, but if she is having symptoms of possible fluid her blood pressure is that low.  Probably just needs to be seen by PCP or urgent care to ensure that she is not actively ill.  Make sure she does have a good hydrates and holds her Lasix  today and tomorrow. During Ryan's visit her blood pressure was 90s systolic so that is not that low for her.  Randene Bustard, MD

## 2023-11-21 NOTE — Telephone Encounter (Signed)
 Left a message for the patient to call back.

## 2023-11-21 NOTE — Telephone Encounter (Signed)
 Pt returning call to a nurse

## 2023-11-21 NOTE — Telephone Encounter (Signed)
 Pt calling to report BP was 94/68 and hr 104

## 2023-11-21 NOTE — Telephone Encounter (Signed)
 Pt called back with improved diastolic BP (99/59 now 94/68) and HR (118 now 104) and reports some cough now with 2-3 episodes of diarrhea in the last 24 hours.  Pt advised on hydration methods (sugar free Gatorade, electrolyte replacement) to improve BP and lower HR.  Pt will continue with tylenol  to mitigate symptoms.  ED precautions given.  COVID and influenza swabs still pending from [facility].  Pt taking Imdur  15 daily and Metoprolol  tartrate 25 mg daily and reports typical BP 120/70.  Please advise on holding BP meds while pressure soft.  Thank you!

## 2023-11-21 NOTE — Telephone Encounter (Signed)
 Pt called back (at store at this time)   Pt reports feeling better now and no more "flutter" feeling, pt reports s/sx prior to going to walk-in clinic were: sore throat, "stuffy head", and body aches -- pt took tylenol  and reports her son and grandson were sick earlier this week  - pending Flu and Covid swab tests  Pt given ED precautions and reports will call us  when she gets home to reports BP and HR

## 2023-11-21 NOTE — Telephone Encounter (Signed)
 STAT if HR is under 50 or over 120  (normal HR is 60-100 beats per minute)  What is your heart rate?  118 bpm this morning  Do you have a log of your heart rate readings (document readings)?   Do you have any other symptoms?  Body aches started yesterday. Patient says she went to a walk-in clinic and they refused to see her and advised her to go to the ED. HR was high and BP was low at 99/59. Patient says she is not going to the ED and would like to know if she can be seen in office. Patient mentions having flutters earlier this morning. Patient assumes she has a cold. She says they didn't swab her for COVID/flu, but her husband tested negative and he was having symptoms a few days before her.

## 2023-11-22 ENCOUNTER — Ambulatory Visit (INDEPENDENT_AMBULATORY_CARE_PROVIDER_SITE_OTHER): Admitting: Podiatry

## 2023-11-22 DIAGNOSIS — Z91199 Patient's noncompliance with other medical treatment and regimen due to unspecified reason: Secondary | ICD-10-CM

## 2023-11-22 NOTE — Telephone Encounter (Signed)
 Agree with all prior recommendations including holding furosemide  for today or as long as blood pressure remains less than 100 mmHg systolic.  Sounds like she has a noncardiac illness with recommendation for her to be evaluated in the ED, urgent care, or PCP's office.

## 2023-11-22 NOTE — Telephone Encounter (Signed)
 Pt made aware and verbalized understanding.

## 2023-11-25 ENCOUNTER — Ambulatory Visit: Payer: Self-pay

## 2023-11-25 NOTE — Telephone Encounter (Signed)
 Chief Complaint: Cough Symptoms: Productive cough, yellow sputum, body aches, wheezing  Frequency: Constant, onset last week Pertinent Negatives: Patient denies fever, chest pain, nausea, vomiting Disposition: [] ED /[] Urgent Care (no appt availability in office) / [x] Appointment(In office/virtual)/ []  [facility]/ [] Home Care/ [] Refused Recommended Disposition /[] [facility]/ []  Follow-up with PCP Additional Notes: Patient went to [facility] walk-in clinic on 11/22/23, per note ED was advised due to low BP and high heart rate. Patient refused ED and stated she would call Cardiologist to report findings. Patient called Dr. Adolphus Hoops office to report the information and ED, urgent care or PCP visit was recommended. Patient states she does not need to go to the ED she just need to be seen by her PCP for this cough and be treated. Care advice was given and patient has been scheduled with tomorrow with Jolene.  Copied from CRM [id number]. Topic: Clinical - Red Word Triage >> Nov 25, 2023 12:00 PM Lotus Round B wrote: [facility] that prompted transfer to Nurse Triage: Very sick . Uncontrollable cough, on and off high fever , shortness of breath . Reason for Disposition  [1] Continuous (nonstop) coughing interferes with work or school AND [2] no improvement using cough treatment per Care Advice  Answer Assessment - Initial Assessment Questions 1. ONSET: "When did the cough begin?"      Last week  2. SEVERITY: "How bad is the cough today?"      Moderate to Severe 3. SPUTUM: "Describe the color of your sputum" (none, dry cough; clear, white, yellow, green)     Yellow  4. HEMOPTYSIS: "Are you coughing up any blood?" If so ask: "How much?" (flecks, streaks, tablespoons, etc.)     No  5. DIFFICULTY BREATHING: "Are you having difficulty breathing?" If Yes, ask: "How bad is it?" (e.g., mild, moderate, severe)    - MILD: No SOB at rest, mild SOB with walking, speaks normally in sentences, can lie  down, no retractions, pulse < 100.    - MODERATE: SOB at rest, SOB with minimal exertion and prefers to sit, cannot lie down flat, speaks in phrases, mild retractions, audible wheezing, pulse 100-120.    - SEVERE: Very SOB at rest, speaks in single words, struggling to breathe, sitting hunched forward, retractions, pulse > 120      Mild SOB 6. FEVER: "Do you have a fever?" If Yes, ask: "What is your temperature, how was it measured, and when did it start?"     Comes and goes  7. CARDIAC HISTORY: "Do you have any history of heart disease?" (e.g., heart attack, congestive heart failure)      N/A 8. LUNG HISTORY: "Do you have any history of lung disease?"  (e.g., pulmonary embolus, asthma, emphysema)     No  9. PE RISK FACTORS: "Do you have a history of blood clots?" (or: recent major surgery, recent prolonged travel, bedridden)     N/A 10. OTHER SYMPTOMS: "Do you have any other symptoms?" (e.g., runny nose, wheezing, chest pain)       Runny nose, Wheezing, body ache 11. PREGNANCY: "Is there any chance you are pregnant?" "When was your last menstrual period?"       N/A 12. TRAVEL: "Have you traveled out of the country in the last month?" (e.g., travel history, exposures)       N/A  Protocols used: Cough - Acute Productive-A-AH

## 2023-11-26 ENCOUNTER — Ambulatory Visit
Admission: RE | Admit: 2023-11-26 | Discharge: 2023-11-26 | Disposition: A | Discharge status: Home / Self Care | Source: Ambulatory Visit | Attending: Nurse Practitioner | Admitting: Nurse Practitioner

## 2023-11-26 ENCOUNTER — Telehealth: Payer: Self-pay | Admitting: Nurse Practitioner

## 2023-11-26 ENCOUNTER — Encounter: Payer: Self-pay | Admitting: Nurse Practitioner

## 2023-11-26 ENCOUNTER — Ambulatory Visit
Admission: RE | Admit: 2023-11-26 | Discharge: 2023-11-26 | Disposition: A | Discharge status: Home / Self Care | Attending: Nurse Practitioner | Admitting: Nurse Practitioner

## 2023-11-26 ENCOUNTER — Ambulatory Visit (INDEPENDENT_AMBULATORY_CARE_PROVIDER_SITE_OTHER): Admitting: Nurse Practitioner

## 2023-11-26 VITALS — BP 92/62 | HR 108 | Temp 98.2°F | Ht 60.0 in | Wt 110.8 lb

## 2023-11-26 DIAGNOSIS — R052 Subacute cough: Secondary | ICD-10-CM | POA: Diagnosis not present

## 2023-11-26 DIAGNOSIS — J189 Pneumonia, unspecified organism: Secondary | ICD-10-CM | POA: Diagnosis not present

## 2023-11-26 DIAGNOSIS — J40 Bronchitis, not specified as acute or chronic: Secondary | ICD-10-CM | POA: Diagnosis not present

## 2023-11-26 MED ORDER — DOXYCYCLINE HYCLATE 100 MG PO TABS: 100.0000 mg | ORAL_TABLET | Freq: Two times a day (BID) | ORAL | 0 refills | Status: DC

## 2023-11-26 MED ORDER — AMOXICILLIN-POT CLAVULANATE 875-125 MG PO TABS
1.0000 | ORAL_TABLET | Freq: Two times a day (BID) | ORAL | 0 refills | Status: DC
Start: 2023-11-26 — End: 2023-12-03

## 2023-11-26 NOTE — Patient Instructions (Addendum)
 Diabetic Tussin and Coricidin  Community-Acquired Pneumonia, Adult Pneumonia is an infection of the lungs. It causes irritation and swelling in the airways of the lungs. Mucus and fluid may also build up inside the airways. This may cause coughing and trouble breathing. One type of pneumonia can happen while you are in a hospital. A different type can happen when you are not in a hospital (community-acquired pneumonia). What are the causes?  This condition is caused by germs (viruses, bacteria, or fungi). Some types of germs can spread from person to person. Pneumonia is not thought to spread from person to person. What increases the risk? You have a long-term (chronic) disease, such as: Disease of the lungs. This may be chronic obstructive pulmonary disease (COPD) or asthma. Heart failure. Cystic fibrosis. Diabetes. Kidney disease. Sickle cell disease. HIV. You have other health problems, such as: Your body's defense system (immune system) is weak. A condition that may cause you to breathe in fluids from your mouth and nose. You had your spleen taken out. You do not take good care of your teeth and mouth (poor dental hygiene). You use or have used tobacco products. You go where the germs that cause this illness are common. You are older than 67 years of age. What are the signs or symptoms? A cough. A fever. Sweating or chills. Chest pain, often when you breathe deeply or cough. Breathing problems, such as: Fast breathing. Trouble breathing. Shortness of breath. Feeling tired (fatigued). Muscle aches. How is this treated? Treatment for this condition depends on many things, such as: The cause of your illness. Your medicines. Your other health problems. Most adults can be treated at home. Sometimes, treatment must happen in a hospital. Treatment may include medicines to kill germs. Medicines may depend on which germ caused your illness. Very bad pneumonia is rare. If you  get it, you may: Have a machine to help you breathe. Have fluid taken away from around your lungs. Follow these instructions at home: Medicines Take over-the-counter and prescription medicines only as told by your doctor. Take cough medicine only if you are losing sleep. Cough medicine can keep your body from taking mucus away from your lungs. If you were prescribed antibiotics, take them as told by your doctor. Do not stop taking them even if you start to feel better. Lifestyle     Do not smoke or use any products that contain nicotine or tobacco. If you need help quitting, ask your doctor. Do not drink alcohol. Eat a healthy diet. This includes a lot of vegetables, fruits, whole grains, low-fat dairy products, and low-fat (lean) protein. General instructions  Rest a lot. Sleep for at least 8 hours each night. Sleep with your head and neck raised. Put a few pillows under your head or sleep in a reclining chair. Return to your normal activities as told by your doctor. Ask your doctor what activities are safe for you. Drink enough fluid to keep your pee (urine) pale yellow. If your throat is sore, gargle with a mixture of salt and water 3-4 times a day or as needed. To make salt water, completely dissolve -1 tsp (3-6 g) of salt in 1 cup (237 mL) of warm water. Keep all follow-up visits. How is this prevented? Getting the pneumonia shot (vaccine). These shots have different types and schedules. Ask your doctor what works best for you. Think about getting this shot if: You are older than 67 years of age. You are 67-34 years of age  and: You are being treated for cancer. You have long-term lung disease. You have other problems that affect your body's defense system. Ask your doctor if you have one of these. Getting your flu shot every year. Ask your doctor which type of shot is best for you. Going to the dentist as often as told. Washing your hands often with soap and water for at least  20 seconds. If you cannot use soap and water, use hand sanitizer. Contact a doctor if: You have a fever. You lose sleep because your cough medicine does not help. Get help right away if: You are short of breath and this gets worse. You have more chest pain. Your sickness gets worse. This is very serious if: You are an older adult. Your body's defense system is weak. You cough up blood. These symptoms may be an emergency. Get help right away. Call 911. Do not wait to see if the symptoms will go away. Do not drive yourself to the hospital. Summary Pneumonia is an infection of the lungs. Community-acquired pneumonia affects people who have not been in the hospital. Certain germs can cause this infection. This condition may be treated with medicines that kill germs. For very bad pneumonia, you may need a hospital stay and treatment to help with breathing. This information is not intended to replace advice given to you by your health care provider. Make sure you discuss any questions you have with your health care provider. Document Revised: 09/13/2021 Document Reviewed: 09/13/2021 Elsevier Patient Education  2024 [employer].

## 2023-11-26 NOTE — Assessment & Plan Note (Signed)
 Acute for 2 weeks with no improvement, husband has PNA.  Recent respiratory screening all negative at [facility].  Curb-65 currently = 1, can consider outpatient PNA treatment.  Suspect PNA based on exam.  Will start Augmentin  BID for 7 days and add on Doxycycline  after imaging if PNA present.  Imaging ordered.  Avoid Zpack due to Seroquel  use.  LABS: CBC, BMP.  Recommend: - Increased rest - Increasing Fluids - Acetaminophen  as needed for fever/pain.  - Salt water gargling, chloraseptic spray and throat lozenges - OTC Coricidin or Diabetic Tussin - Humidifying the air - Strict ER precautions provided and she is aware if any worsening symptoms to immediately go to ER.

## 2023-11-26 NOTE — Progress Notes (Signed)
 BP 92/62   Pulse (!) 108   Temp 98.2 F (36.8 C) (Oral)   Ht 5' (1.524 m)   Wt 110 lb 12.8 oz (50.3 kg)   LMP  (LMP Unknown)   SpO2 98%   BMI 21.64 kg/m    Subjective:    Patient ID: Amy Hansen, female    DOB: 10-Feb-1956, 68 y.o.   MRN: [id number]  HPI: Amy Hansen is a 68 y.o. female  Chief Complaint  Patient presents with   Cough    Patient states she has been having a cough, SOB, chest congestion, and fatigue for the last 2 weeks. States her husband has pneumonia.    UPPER RESPIRATORY TRACT INFECTION Has been sick, going into week 2.  Her husband has pneumonia.  Went to urgent care and they would not treat her due to low BP and elevated HR, they recommended ER to which she refused.  She did call her cardiologist after and they told her to hydrate well and monitor, they have been keeping in touch she says.  Negative Flu and Covid + RSV at [facility]. Has been trying to push fluids and drinking Pedialyte. Fever: yes off and on for past week Cough: yes Shortness of breath: yes Wheezing: yes Chest pain: no Chest tightness: yes Chest congestion: yes Nasal congestion: yes Runny nose: yes Post nasal drip: yes Sneezing: no Sore throat: yes Swollen glands: no Sinus pressure: yes Headache: yes Face pain: no Toothache:  occasional tooth discomfort Ear pain: none Ear pressure: none Eyes red/itching:no Eye drainage/crusting: no  Vomiting: no Rash: no Fatigue: yes Sick contacts: yes Strep contacts: no  Context: fluctuating, yesterday was the worst day Recurrent sinusitis: no Relief with OTC cold/cough medications: some relief  Treatments attempted: Delsym and Mucinex    Relevant past medical, surgical, family and social history reviewed and updated as indicated. Interim medical history since our last visit reviewed. Allergies and medications reviewed and updated.  Review of Systems  Constitutional:  Positive for chills, fatigue and fever. Negative for activity change  and appetite change.  HENT:  Positive for congestion, postnasal drip, rhinorrhea, sinus pressure, sinus pain and sore throat. Negative for ear discharge, ear pain, facial swelling, sneezing and voice change.   Respiratory:  Positive for cough, chest tightness, shortness of breath and wheezing.   Cardiovascular:  Negative for chest pain, palpitations and leg swelling.  Gastrointestinal: Negative.   Musculoskeletal:  Positive for myalgias.  Neurological:  Positive for headaches. Negative for dizziness and numbness.  Psychiatric/Behavioral: Negative.      Per HPI unless specifically indicated above     Objective:    BP 92/62   Pulse (!) 108   Temp 98.2 F (36.8 C) (Oral)   Ht 5' (1.524 m)   Wt 110 lb 12.8 oz (50.3 kg)   LMP  (LMP Unknown)   SpO2 98%   BMI 21.64 kg/m   Wt Readings from Last 3 Encounters:  11/26/23 110 lb 12.8 oz (50.3 kg)  11/05/23 116 lb (52.6 kg)  10/17/23 122 lb 3.2 oz (55.4 kg)    Physical Exam Vitals and nursing note reviewed.  Constitutional:      General: She is awake. She is not in acute distress.    Appearance: She is well-developed and well-groomed. She is obese. She is ill-appearing. She is not toxic-appearing.     Comments: Hoarseness present.  HENT:     Head: Normocephalic.     Right Ear: Hearing, ear canal and  external ear normal. A middle ear effusion is present. Tympanic membrane is not injected or perforated.     Left Ear: Hearing, ear canal and external ear normal. A middle ear effusion is present. Tympanic membrane is not injected or perforated.     Nose: Rhinorrhea present. Rhinorrhea is clear.     Right Sinus: No maxillary sinus tenderness or frontal sinus tenderness.     Left Sinus: No maxillary sinus tenderness or frontal sinus tenderness.     Mouth/Throat:     Mouth: Mucous membranes are moist.     Pharynx: Posterior oropharyngeal erythema (mild) present. No pharyngeal swelling or oropharyngeal exudate.  Eyes:     General: Lids are  normal.        Right eye: No discharge.        Left eye: No discharge.     Conjunctiva/sclera: Conjunctivae normal.     Pupils: Pupils are equal, round, and reactive to light.  Neck:     Thyroid : No thyromegaly.     Vascular: No carotid bruit.  Cardiovascular:     Rate and Rhythm: Regular rhythm. Tachycardia present.     Heart sounds: Normal heart sounds. No murmur heard.    No gallop.  Pulmonary:     Effort: Pulmonary effort is normal. No accessory muscle usage or respiratory distress.     Breath sounds: Wheezing and rales present. No decreased breath sounds or rhonchi.     Comments: Expiratory wheezes intermittently throughout and coarse rales bilateral lower lobes L>R.  No SOB with talking or walking.  Frequent coarse cough present. Abdominal:     General: Bowel sounds are normal.     Palpations: Abdomen is soft. There is no hepatomegaly or splenomegaly.  Musculoskeletal:     Cervical back: Normal range of motion and neck supple.     Right lower leg: No edema.     Left lower leg: No edema.  Lymphadenopathy:     Head:     Right side of head: No submental, submandibular, tonsillar, preauricular or posterior auricular adenopathy.     Left side of head: No submental, submandibular, tonsillar, preauricular or posterior auricular adenopathy.     Cervical: No cervical adenopathy.  Skin:    General: Skin is warm and dry.  Neurological:     Mental Status: She is alert and oriented to person, place, and time.  Psychiatric:        Attention and Perception: Attention normal.        Mood and Affect: Mood normal.        Speech: Speech normal.        Behavior: Behavior normal. Behavior is cooperative.        Thought Content: Thought content normal.     Results for orders placed or performed in visit on 10/31/23  Microscopic Examination   Collection Time: 10/31/23  1:57 PM   Urine  Result Value Ref Range   WBC, UA >30 (A) 0 - 5 /hpf   RBC, Urine >30 (A) 0 - 2 /hpf   Epithelial  Cells (non renal) 0-10 0 - 10 /hpf   Bacteria, UA Many (A) None seen/Few  Urinalysis, Complete   Collection Time: 10/31/23  1:57 PM  Result Value Ref Range   Specific Gravity, UA 1.015 1.005 - 1.030   pH, UA 6.0 5.0 - 7.5   Color, UA Yellow Yellow   Appearance Ur Cloudy (A) Clear   Leukocytes,UA 1+ (A) Negative   Protein,UA Negative Negative/Trace  Glucose, UA Negative Negative   Ketones, UA Negative Negative   RBC, UA 3+ (A) Negative   Bilirubin, UA Negative Negative   Urobilinogen, Ur 0.2 0.2 - 1.0 mg/dL   Nitrite, UA Positive (A) Negative   Microscopic Examination See below:   Hepatic function panel   Collection Time: 10/31/23  2:41 PM  Result Value Ref Range   Total Protein 6.6 6.0 - 8.5 g/dL   Albumin 4.0 3.9 - 4.9 g/dL   Bilirubin Total 0.4 0.0 - 1.2 mg/dL   Bilirubin, Direct 4.09 0.00 - 0.40 mg/dL   Alkaline Phosphatase 251 (H) 44 - 121 IU/L   AST 30 0 - 40 IU/L   ALT 26 0 - 32 IU/L  CULTURE, URINE COMPREHENSIVE   Collection Time: 10/31/23  4:04 PM   Specimen: Urine   UR  Result Value Ref Range   Urine Culture, Comprehensive Final report (A)    Organism ID, Bacteria Klebsiella aerogenes (A)    Organism ID, Bacteria Not applicable    ANTIMICROBIAL SUSCEPTIBILITY Comment    *Note: Due to a large number of results and/or encounters for the requested time period, some results have not been displayed. A complete set of results can be found in Results Review.      Assessment & Plan:   Problem List Items Addressed This Visit       Other   Subacute cough - Primary   Acute for 2 weeks with no improvement, husband has PNA.  Recent respiratory screening all negative at [facility].  Curb-65 currently = 1, can consider outpatient PNA treatment.  Suspect PNA based on exam.  Will start Augmentin  BID for 7 days and add on Doxycycline  after imaging if PNA present.  Imaging ordered.  Avoid Zpack due to Seroquel  use.  LABS: CBC, BMP.  Recommend: - Increased rest - Increasing Fluids -  Acetaminophen  as needed for fever/pain.  - Salt water gargling, chloraseptic spray and throat lozenges - OTC Coricidin or Diabetic Tussin - Humidifying the air - Strict ER precautions provided and she is aware if any worsening symptoms to immediately go to ER.      Relevant Orders   DG Chest 2 View   CBC with Differential/Platelet   Basic metabolic panel with GFR     Follow up plan: Return in about 3 days (around 11/29/2023) for Pneumonia.

## 2023-11-26 NOTE — Telephone Encounter (Signed)
 Spoke to patient via telephone and reported chest imaging results noting bilateral pneumonia.  She is scheduled to follow-up on Friday.  Will treat with Augmentin  and Doxycyline.  Can not use Zpack due to Seroquel  use.  Double therapy for CAP.  Current CURB-65 = 1.  She is aware that if any worsening symptoms she is immediately to go to ER and she agrees with this plan.  Was able to verbalize back plan of care.

## 2023-11-27 ENCOUNTER — Ambulatory Visit: Payer: Self-pay

## 2023-11-27 ENCOUNTER — Other Ambulatory Visit: Payer: Self-pay

## 2023-11-27 ENCOUNTER — Encounter: Payer: Self-pay | Admitting: Nurse Practitioner

## 2023-11-27 ENCOUNTER — Observation Stay (HOSPITAL_BASED_OUTPATIENT_CLINIC_OR_DEPARTMENT_OTHER)
Admission: EM | Admit: 2023-11-27 | Discharge: 2023-11-28 | Disposition: A | Discharge status: Home / Self Care | Source: Home / Self Care | Attending: Emergency Medicine | Admitting: Emergency Medicine

## 2023-11-27 ENCOUNTER — Emergency Department

## 2023-11-27 DIAGNOSIS — K219 Gastro-esophageal reflux disease without esophagitis: Secondary | ICD-10-CM | POA: Diagnosis present

## 2023-11-27 DIAGNOSIS — E1159 Type 2 diabetes mellitus with other circulatory complications: Secondary | ICD-10-CM | POA: Diagnosis not present

## 2023-11-27 DIAGNOSIS — I2510 Atherosclerotic heart disease of native coronary artery without angina pectoris: Secondary | ICD-10-CM | POA: Insufficient documentation

## 2023-11-27 DIAGNOSIS — E1142 Type 2 diabetes mellitus with diabetic polyneuropathy: Secondary | ICD-10-CM | POA: Insufficient documentation

## 2023-11-27 DIAGNOSIS — Z87891 Personal history of nicotine dependence: Secondary | ICD-10-CM | POA: Insufficient documentation

## 2023-11-27 DIAGNOSIS — A419 Sepsis, unspecified organism: Secondary | ICD-10-CM | POA: Diagnosis not present

## 2023-11-27 DIAGNOSIS — F329 Major depressive disorder, single episode, unspecified: Secondary | ICD-10-CM | POA: Diagnosis present

## 2023-11-27 DIAGNOSIS — E876 Hypokalemia: Secondary | ICD-10-CM | POA: Diagnosis not present

## 2023-11-27 DIAGNOSIS — Z7901 Long term (current) use of anticoagulants: Secondary | ICD-10-CM | POA: Insufficient documentation

## 2023-11-27 DIAGNOSIS — I10 Essential (primary) hypertension: Secondary | ICD-10-CM | POA: Insufficient documentation

## 2023-11-27 DIAGNOSIS — J189 Pneumonia, unspecified organism: Principal | ICD-10-CM

## 2023-11-27 DIAGNOSIS — R0781 Pleurodynia: Secondary | ICD-10-CM | POA: Diagnosis not present

## 2023-11-27 DIAGNOSIS — I110 Hypertensive heart disease with heart failure: Secondary | ICD-10-CM | POA: Diagnosis not present

## 2023-11-27 DIAGNOSIS — N12 Tubulo-interstitial nephritis, not specified as acute or chronic: Secondary | ICD-10-CM | POA: Diagnosis present

## 2023-11-27 DIAGNOSIS — I152 Hypertension secondary to endocrine disorders: Secondary | ICD-10-CM | POA: Diagnosis present

## 2023-11-27 DIAGNOSIS — G2581 Restless legs syndrome: Secondary | ICD-10-CM | POA: Diagnosis not present

## 2023-11-27 DIAGNOSIS — F331 Major depressive disorder, recurrent, moderate: Secondary | ICD-10-CM | POA: Insufficient documentation

## 2023-11-27 DIAGNOSIS — F429 Obsessive-compulsive disorder, unspecified: Secondary | ICD-10-CM | POA: Insufficient documentation

## 2023-11-27 DIAGNOSIS — F419 Anxiety disorder, unspecified: Secondary | ICD-10-CM | POA: Diagnosis not present

## 2023-11-27 DIAGNOSIS — E1165 Type 2 diabetes mellitus with hyperglycemia: Secondary | ICD-10-CM | POA: Diagnosis present

## 2023-11-27 DIAGNOSIS — R918 Other nonspecific abnormal finding of lung field: Secondary | ICD-10-CM | POA: Diagnosis not present

## 2023-11-27 DIAGNOSIS — J188 Other pneumonia, unspecified organism: Secondary | ICD-10-CM | POA: Insufficient documentation

## 2023-11-27 DIAGNOSIS — E860 Dehydration: Secondary | ICD-10-CM | POA: Diagnosis not present

## 2023-11-27 DIAGNOSIS — I5033 Acute on chronic diastolic (congestive) heart failure: Secondary | ICD-10-CM | POA: Diagnosis not present

## 2023-11-27 DIAGNOSIS — I700 Atherosclerosis of aorta: Secondary | ICD-10-CM | POA: Diagnosis not present

## 2023-11-27 DIAGNOSIS — Z1152 Encounter for screening for COVID-19: Secondary | ICD-10-CM | POA: Insufficient documentation

## 2023-11-27 DIAGNOSIS — Z9189 Other specified personal risk factors, not elsewhere classified: Secondary | ICD-10-CM

## 2023-11-27 DIAGNOSIS — R0602 Shortness of breath: Secondary | ICD-10-CM | POA: Diagnosis not present

## 2023-11-27 DIAGNOSIS — R000 Tachycardia, unspecified: Secondary | ICD-10-CM | POA: Diagnosis not present

## 2023-11-27 DIAGNOSIS — Z7902 Long term (current) use of antithrombotics/antiplatelets: Secondary | ICD-10-CM | POA: Diagnosis not present

## 2023-11-27 DIAGNOSIS — Z7982 Long term (current) use of aspirin: Secondary | ICD-10-CM | POA: Diagnosis not present

## 2023-11-27 DIAGNOSIS — Z79899 Other long term (current) drug therapy: Secondary | ICD-10-CM | POA: Insufficient documentation

## 2023-11-27 DIAGNOSIS — Z7985 Long-term (current) use of injectable non-insulin antidiabetic drugs: Secondary | ICD-10-CM | POA: Diagnosis not present

## 2023-11-27 DIAGNOSIS — E1140 Type 2 diabetes mellitus with diabetic neuropathy, unspecified: Secondary | ICD-10-CM | POA: Diagnosis not present

## 2023-11-27 DIAGNOSIS — Z794 Long term (current) use of insulin: Secondary | ICD-10-CM | POA: Insufficient documentation

## 2023-11-27 DIAGNOSIS — G629 Polyneuropathy, unspecified: Secondary | ICD-10-CM

## 2023-11-27 DIAGNOSIS — K5090 Crohn's disease, unspecified, without complications: Secondary | ICD-10-CM | POA: Diagnosis not present

## 2023-11-27 DIAGNOSIS — Z79890 Hormone replacement therapy (postmenopausal): Secondary | ICD-10-CM | POA: Diagnosis not present

## 2023-11-27 DIAGNOSIS — J9601 Acute respiratory failure with hypoxia: Secondary | ICD-10-CM | POA: Diagnosis not present

## 2023-11-27 DIAGNOSIS — R1310 Dysphagia, unspecified: Secondary | ICD-10-CM | POA: Diagnosis not present

## 2023-11-27 DIAGNOSIS — H547 Unspecified visual loss: Secondary | ICD-10-CM | POA: Diagnosis not present

## 2023-11-27 LAB — RESP PANEL BY RT-PCR (RSV, FLU A&B, COVID)  RVPGX2
Influenza A by PCR: NEGATIVE
Influenza B by PCR: NEGATIVE
Resp Syncytial Virus by PCR: NEGATIVE
SARS Coronavirus 2 by RT PCR: NEGATIVE

## 2023-11-27 LAB — LACTIC ACID, PLASMA
Lactic Acid, Venous: 1.7 mmol/L (ref 0.5–1.9)
Lactic Acid, Venous: 1.7 mmol/L (ref 0.5–1.9)

## 2023-11-27 LAB — CBC
HCT: 35.3 % — ABNORMAL LOW (ref 36.0–46.0)
Hemoglobin: 11.4 g/dL — ABNORMAL LOW (ref 12.0–15.0)
MCH: 25.7 pg — ABNORMAL LOW (ref 26.0–34.0)
MCHC: 32.3 g/dL (ref 30.0–36.0)
MCV: 79.5 fL — ABNORMAL LOW (ref 80.0–100.0)
Platelets: 376 10*3/uL (ref 150–400)
RBC: 4.44 MIL/uL (ref 3.87–5.11)
RDW: 15.9 % — ABNORMAL HIGH (ref 11.5–15.5)
WBC: 5.1 10*3/uL (ref 4.0–10.5)
nRBC: 0 % (ref 0.0–0.2)

## 2023-11-27 LAB — TROPONIN I (HIGH SENSITIVITY)
Troponin I (High Sensitivity): 7 ng/L (ref <18)
Troponin I (High Sensitivity): 6 ng/L (ref <18)

## 2023-11-27 LAB — BASIC METABOLIC PANEL WITH GFR
Anion gap: 5 (ref 5–15)
BUN/Creatinine Ratio: 8 — ABNORMAL LOW (ref 12–28)
BUN: 6 mg/dL — ABNORMAL LOW (ref 8–27)
BUN: 7 mg/dL — ABNORMAL LOW (ref 8–23)
CO2: 16 mmol/L — ABNORMAL LOW (ref 20–29)
CO2: 20 mmol/L — ABNORMAL LOW (ref 22–32)
Calcium: 8.1 mg/dL — ABNORMAL LOW (ref 8.7–10.3)
Calcium: 8.1 mg/dL — ABNORMAL LOW (ref 8.9–10.3)
Chloride: 99 mmol/L (ref 96–106)
Chloride: 106 mmol/L (ref 98–111)
Creatinine, Ser: 0.73 mg/dL (ref 0.57–1.00)
Creatinine, Ser: 0.63 mg/dL (ref 0.44–1.00)
GFR, Estimated: >60 mL/min (ref >60)
Glucose, Bld: 205 mg/dL — ABNORMAL HIGH (ref 70–99)
Glucose: 227 mg/dL — ABNORMAL HIGH (ref 70–99)
Potassium: 3.3 mmol/L — ABNORMAL LOW (ref 3.5–5.2)
Potassium: 3.1 mmol/L — ABNORMAL LOW (ref 3.5–5.1)
Sodium: 134 mmol/L (ref 134–144)
Sodium: 131 mmol/L — ABNORMAL LOW (ref 135–145)
eGFR: 91 mL/min/{1.73_m2} (ref >59)

## 2023-11-27 LAB — CBC WITH DIFFERENTIAL/PLATELET
Basophils Absolute: 0 10*3/uL (ref 0.0–0.2)
Basos: 0 %
EOS (ABSOLUTE): 0 10*3/uL (ref 0.0–0.4)
Eos: 0 %
Hematocrit: 35.1 % (ref 34.0–46.6)
Hemoglobin: 10.9 g/dL — ABNORMAL LOW (ref 11.1–15.9)
Immature Grans (Abs): 0 10*3/uL (ref 0.0–0.1)
Immature Granulocytes: 1 %
Lymphocytes Absolute: 0.7 10*3/uL (ref 0.7–3.1)
Lymphs: 16 %
MCH: 25.8 pg — ABNORMAL LOW (ref 26.6–33.0)
MCHC: 31.1 g/dL — ABNORMAL LOW (ref 31.5–35.7)
MCV: 83 fL (ref 79–97)
Monocytes Absolute: 0.2 10*3/uL (ref 0.1–0.9)
Monocytes: 4 %
Neutrophils Absolute: 3.5 10*3/uL (ref 1.4–7.0)
Neutrophils: 79 %
Platelets: 301 10*3/uL (ref 150–450)
RBC: 4.22 x10E6/uL (ref 3.77–5.28)
RDW: 14.8 % (ref 11.7–15.4)
WBC: 4.4 10*3/uL (ref 3.4–10.8)

## 2023-11-27 LAB — GLUCOSE, CAPILLARY
Glucose-Capillary: 163 mg/dL — ABNORMAL HIGH (ref 70–99)
Glucose-Capillary: 133 mg/dL — ABNORMAL HIGH (ref 70–99)

## 2023-11-27 MED ORDER — METOPROLOL TARTRATE 25 MG PO TABS
25.0000 mg | ORAL_TABLET | Freq: Every day | ORAL | Status: DC
  Administered 2023-11-28: 25 mg via ORAL
  Filled 2023-11-27: qty 1

## 2023-11-27 MED ORDER — HYDROCOD POLI-CHLORPHE POLI ER 10-8 MG/5ML PO SUER
5.0000 mL | Freq: Once | ORAL | Status: AC
  Administered 2023-11-27: 5 mL via ORAL
  Filled 2023-11-27: qty 5

## 2023-11-27 MED ORDER — ACETAMINOPHEN 325 MG PO TABS
650.0000 mg | ORAL_TABLET | Freq: Four times a day (QID) | ORAL | Status: DC | PRN
  Administered 2023-11-27: 650 mg via ORAL
  Filled 2023-11-27: qty 2

## 2023-11-27 MED ORDER — SERTRALINE HCL 50 MG PO TABS
100.0000 mg | ORAL_TABLET | Freq: Every day | ORAL | Status: DC
  Administered 2023-11-27 – 2023-11-28 (×2): 100 mg via ORAL
  Filled 2023-11-27 (×2): qty 2

## 2023-11-27 MED ORDER — ENOXAPARIN SODIUM 40 MG/0.4ML IJ SOSY
40.0000 mg | PREFILLED_SYRINGE | INTRAMUSCULAR | Status: DC
  Administered 2023-11-27: 40 mg via SUBCUTANEOUS
  Filled 2023-11-27: qty 0.4

## 2023-11-27 MED ORDER — HYDROCOD POLI-CHLORPHE POLI ER 10-8 MG/5ML PO SUER
5.0000 mL | Freq: Every evening | ORAL | Status: DC | PRN
  Administered 2023-11-27: 5 mL via ORAL
  Filled 2023-11-27: qty 5

## 2023-11-27 MED ORDER — HYDRALAZINE HCL 20 MG/ML IJ SOLN: 5.0000 mg | Freq: Four times a day (QID) | INTRAMUSCULAR | Status: DC | PRN

## 2023-11-27 MED ORDER — QUETIAPINE FUMARATE 300 MG PO TABS
300.0000 mg | ORAL_TABLET | Freq: Every day | ORAL | Status: DC
  Administered 2023-11-27: 300 mg via ORAL
  Filled 2023-11-27: qty 1

## 2023-11-27 MED ORDER — INSULIN ASPART 100 UNIT/ML IJ SOLN: 0.0000 [IU] | Freq: Every day | INTRAMUSCULAR | Status: DC

## 2023-11-27 MED ORDER — PANTOPRAZOLE SODIUM 40 MG PO TBEC
40.0000 mg | DELAYED_RELEASE_TABLET | Freq: Every day | ORAL | Status: DC
  Administered 2023-11-28: 40 mg via ORAL
  Filled 2023-11-27: qty 1

## 2023-11-27 MED ORDER — LACTATED RINGERS IV SOLN: INTRAVENOUS | Status: DC

## 2023-11-27 MED ORDER — SODIUM CHLORIDE 0.9 % IV SOLN
500.0000 mg | Freq: Once | INTRAVENOUS | Status: AC
  Administered 2023-11-27: 500 mg via INTRAVENOUS
  Filled 2023-11-27: qty 5

## 2023-11-27 MED ORDER — IPRATROPIUM-ALBUTEROL 0.5-2.5 (3) MG/3ML IN SOLN
3.0000 mL | Freq: Once | RESPIRATORY_TRACT | Status: AC
  Administered 2023-11-27: 3 mL via RESPIRATORY_TRACT
  Filled 2023-11-27: qty 3

## 2023-11-27 MED ORDER — SENNOSIDES-DOCUSATE SODIUM 8.6-50 MG PO TABS: 1.0000 | ORAL_TABLET | Freq: Every evening | ORAL | Status: DC | PRN

## 2023-11-27 MED ORDER — INSULIN ASPART 100 UNIT/ML IJ SOLN: 0.0000 [IU] | Freq: Three times a day (TID) | INTRAMUSCULAR | Status: DC

## 2023-11-27 MED ORDER — ONDANSETRON HCL 4 MG PO TABS: 4.0000 mg | ORAL_TABLET | Freq: Four times a day (QID) | ORAL | Status: DC | PRN

## 2023-11-27 MED ORDER — GUAIFENESIN 100 MG/5ML PO LIQD
5.0000 mL | Freq: Four times a day (QID) | ORAL | Status: DC | PRN
  Administered 2023-11-28: 5 mL via ORAL
  Filled 2023-11-27: qty 10

## 2023-11-27 MED ORDER — SODIUM CHLORIDE 0.9 % IV SOLN
2.0000 g | INTRAVENOUS | Status: DC
  Administered 2023-11-28: 2 g via INTRAVENOUS
  Filled 2023-11-27: qty 20

## 2023-11-27 MED ORDER — ISOSORBIDE MONONITRATE ER 30 MG PO TB24
15.0000 mg | ORAL_TABLET | Freq: Every day | ORAL | Status: DC
  Administered 2023-11-28: 15 mg via ORAL
  Filled 2023-11-27: qty 1

## 2023-11-27 MED ORDER — ROPINIROLE HCL 1 MG PO TABS
1.0000 mg | ORAL_TABLET | Freq: Two times a day (BID) | ORAL | Status: DC
  Administered 2023-11-27 – 2023-11-28 (×2): 1 mg via ORAL
  Filled 2023-11-27 (×2): qty 1

## 2023-11-27 MED ORDER — SODIUM CHLORIDE 0.9 % IV SOLN
500.0000 mg | INTRAVENOUS | Status: DC
  Administered 2023-11-28: 500 mg via INTRAVENOUS
  Filled 2023-11-27: qty 5

## 2023-11-27 MED ORDER — ROSUVASTATIN CALCIUM 5 MG PO TABS
5.0000 mg | ORAL_TABLET | Freq: Every day | ORAL | Status: DC
  Administered 2023-11-27: 5 mg via ORAL
  Filled 2023-11-27: qty 1

## 2023-11-27 MED ORDER — SODIUM CHLORIDE 0.9 % IV SOLN
2.0000 g | Freq: Once | INTRAVENOUS | Status: AC
  Administered 2023-11-27: 2 g via INTRAVENOUS
  Filled 2023-11-27: qty 20

## 2023-11-27 MED ORDER — ACETAMINOPHEN 650 MG RE SUPP: 650.0000 mg | Freq: Four times a day (QID) | RECTAL | Status: DC | PRN

## 2023-11-27 MED ORDER — NITROGLYCERIN 0.4 MG SL SUBL: 0.4000 mg | SUBLINGUAL_TABLET | SUBLINGUAL | Status: DC | PRN

## 2023-11-27 MED ORDER — METOPROLOL TARTRATE 5 MG/5ML IV SOLN
5.0000 mg | INTRAVENOUS | Status: DC | PRN
Start: 2023-11-27 — End: 2023-11-28

## 2023-11-27 MED ORDER — CLOPIDOGREL BISULFATE 75 MG PO TABS
75.0000 mg | ORAL_TABLET | Freq: Every day | ORAL | Status: DC
  Administered 2023-11-28: 75 mg via ORAL
  Filled 2023-11-27: qty 1

## 2023-11-27 MED ORDER — ONDANSETRON HCL 4 MG/2ML IJ SOLN: 4.0000 mg | Freq: Four times a day (QID) | INTRAMUSCULAR | Status: DC | PRN

## 2023-11-27 MED ORDER — ALPRAZOLAM 0.5 MG PO TABS
0.5000 mg | ORAL_TABLET | Freq: Two times a day (BID) | ORAL | Status: DC | PRN
  Administered 2023-11-27 – 2023-11-28 (×2): 0.5 mg via ORAL
  Filled 2023-11-27 (×2): qty 1

## 2023-11-27 MED ORDER — PRAMIPEXOLE DIHYDROCHLORIDE 1 MG PO TABS: 1.0000 mg | ORAL_TABLET | Freq: Every day | ORAL | Status: DC

## 2023-11-27 MED ORDER — IPRATROPIUM-ALBUTEROL 0.5-2.5 (3) MG/3ML IN SOLN
3.0000 mL | Freq: Four times a day (QID) | RESPIRATORY_TRACT | Status: DC | PRN
  Administered 2023-11-28: 3 mL via RESPIRATORY_TRACT
  Filled 2023-11-27: qty 3

## 2023-11-27 MED ORDER — SODIUM CHLORIDE 0.9 % IV BOLUS (SEPSIS)
1000.0000 mL | Freq: Once | INTRAVENOUS | Status: AC
  Administered 2023-11-27: 1000 mL via INTRAVENOUS

## 2023-11-27 MED ORDER — IOHEXOL 350 MG/ML SOLN
75.0000 mL | Freq: Once | INTRAVENOUS | Status: AC | PRN
  Administered 2023-11-27: 75 mL via INTRAVENOUS

## 2023-11-27 MED ORDER — PREGABALIN 75 MG PO CAPS
75.0000 mg | ORAL_CAPSULE | Freq: Every day | ORAL | Status: DC
  Administered 2023-11-27: 75 mg via ORAL
  Filled 2023-11-27: qty 1

## 2023-11-27 NOTE — Hospital Course (Addendum)
 Amy Hansen is a 68 year old female with history of hypertension, hyperlipidemia, depression, neuropathy, who presents emergency department from home for chief concerns of cough and shortness of breath with fever at home.  Vitals in the ED showed T of 98.3, respiration rate 20, heart rate 116, blood pressure 129/84, SpO2 99% on room air.  Serum sodium is 131, potassium 3.1, chloride 106, bicarb 20, BUN of 7, serum creatinine 0.63, eGFR greater than 60, nonfasting blood glucose 205, WBC 5.1, hemoglobin of 11.4, platelet 376.  ED treatment: Ceftriaxone  2 g IV one-time dose, azithromycin 500 mg IV one-time dose, DuoNebs, Tussionex

## 2023-11-27 NOTE — H&P (Signed)
 History and Physical   Amy Hansen ZOX:[id number] DOB: 27-Nov-1955 DOA: 11/27/2023  PCP: Aileen Alexanders, NP Outpatient Specialists: Dr. Gollan, cardiology Patient coming from: home   I have personally briefly reviewed patient's old medical records in [facility] EMR.  Chief Concern: shortness of breath, cough  HPI: Amy Hansen is a 68 year old female with history of hypertension, hyperlipidemia, depression, neuropathy, who presents emergency department from home for chief concerns of cough and shortness of breath with fever at home.  Vitals in the ED showed T of 98.3, respiration rate 20, heart rate 116, blood pressure 129/84, SpO2 99% on room air.  Serum sodium is 131, potassium 3.1, chloride 106, bicarb 20, BUN of 7, serum creatinine 0.63, eGFR greater than 60, nonfasting blood glucose 205, WBC 5.1, hemoglobin of 11.4, platelet 376.  ED treatment: Ceftriaxone  2 g IV one-time dose, azithromycin 500 mg IV one-time dose, DuoNebs, Tussionex ------------------------------ At bedside, patient was able to tell me her first and last name, age, location.  She was able to identify her husband, Amy Hansen at bedside.  She reports she has been having on and off cough and shortness of breath for at least 8 days.  She reports it is productive cough of yellow sputum.  She denies known sick contacts though her husband was also recently diagnosed with pneumonia.  She reports fever on and off over the last 8 days as well, with Tmax of 101.  She has been taking acetaminophen  for fever relief.  She denies nausea, vomiting, chest pain, dysuria, hematuria, diarrhea.  She reports shortness of breath that is especially worse with exertion or prolonged talking.  She reports she is never felt this way before.  She endorses generalized muscle aches all over.  Social history: She lives at home with her husband of 50 years. She denies tobacco, etoh, and recreational drug use. She is retired and formerly a  Actor.   ROS: Constitutional: no weight change, no fever ENT/Mouth: no sore throat, no rhinorrhea Eyes: no eye pain, no vision changes Cardiovascular: no chest pain, + dyspnea,  no edema, no palpitations Respiratory: + cough, + sputum, no wheezing Gastrointestinal: no nausea, no vomiting, no diarrhea, no constipation Genitourinary: no urinary incontinence, no dysuria, no hematuria Musculoskeletal: no arthralgias, no myalgias Skin: no skin lesions, no pruritus, Neuro: + weakness, no loss of consciousness, no syncope Psych: no anxiety, no depression, + decrease appetite Heme/Lymph: no bruising, no bleeding  ED Course: Discussed with EDP, patient needs hospitalization for multilobar pneumonia.   Assessment/Plan  Principal Problem:   Multilobar lung infiltrate Active Problems:   Coronary artery disease   Peripheral neuropathy   Restless legs syndrome (RLS)   Hypertension associated with type 2 diabetes mellitus (HCC)   GERD (gastroesophageal reflux disease)   Major depression, chronic   OCD (obsessive compulsive disorder)   Anxiety disorder   Pyelonephritis   Type 2 diabetes mellitus with hyperglycemia (HCC)   At risk for polypharmacy   Assessment and Plan:  * Multilobar lung infiltrate Continue with ceftriaxone  2 g IV daily, azithromycin 500 mg IV daily to complete a 5-day course DuoNebs every 6 hours as needed for shortness of breath and wheezing, 3 days ordered Tussionex p.o. nightly as needed for cough, 2 days ordered Guaifenesin 5 mL p.o. every 6 hours as needed to loosen phlegm, 3 days ordered LR 125 mg IV continuously, 1 day Oxygen supplementation as needed to maintain SpO2 greater than 92% Flutter valve, incentive spirometry  Coronary artery disease Plavix  75 mg  daily, rosuvastatin  5 mg nightly resumed  Type 2 diabetes mellitus with hyperglycemia (HCC) Insulin  SSI with at bedtime coverage ordered on admission Goal inpatient blood glucose levels  140-180  Anxiety disorder Home sertraline  100 mg daily, alprazolam  0.5 mg p.o. twice daily as needed for anxiety  GERD (gastroesophageal reflux disease) Home PPI resumed  Hypertension associated with type 2 diabetes mellitus (HCC) Home Imdur  15 mg daily, metoprolol  tartrate 25 mg daily were resumed on admission Hydralazine  5 mg IV every 6 hours as needed for heart rate greater than 170, 5 days ordered  Restless legs syndrome (RLS) Home ropinirole  1 mg p.o. twice daily resumed  Chart reviewed.   DVT prophylaxis: Enoxaparin  40 mg subcutaneous every 24 hours Code Status: Full code Diet: Heart healthy diet Family Communication: Updated husband at bedside with patient's permission Disposition Plan: Pending clinical course; guarded prognosis Consults called: None at this time Admission status: Telemetry medical, observation  Past Medical History:  Diagnosis Date   Acute on chronic heart failure with preserved ejection fraction (HFpEF) (HCC) 05/16/2018   AKI (acute kidney injury) (HCC) 02/05/2023   Anemia    Anxiety    Arthritis    Blood transfusion without reported diagnosis    Cataract    CHF (congestive heart failure) (HCC)    Chronic kidney disease    UTI, hematuria in urine   Colitis    Crohn's disease (HCC)    Depression    Diabetes (HCC)    Diverticulosis    Frequent headaches    Interstitial cystitis    Recurrent UTI    Restless leg syndrome    TIA (transient ischemic attack) 02/20/2021   Urinary frequency    Past Surgical History:  Procedure Laterality Date   bariatric bypass  2012   BIOPSY  05/03/2020   Procedure: BIOPSY;  Surgeon: Janel Medford, MD;  Location: [facility] ENDOSCOPY;  Service: Endoscopy;;   CARPAL TUNNEL RELEASE Right 2003   CARPAL TUNNEL RELEASE Right    2008   CHOLECYSTECTOMY  1975   COLONOSCOPY     COLONOSCOPY WITH PROPOFOL  N/A 05/03/2020   Procedure: COLONOSCOPY WITH PROPOFOL ;  Surgeon: Janel Medford, MD;  Location: [facility] ENDOSCOPY;   Service: Endoscopy;  Laterality: N/A;   CORONARY STENT INTERVENTION N/A 05/15/2023   Procedure: CORONARY STENT INTERVENTION;  Surgeon: Sammy Crisp, MD;  Location: [facility] INVASIVE CV LAB;  Service: Cardiovascular;  Laterality: N/A;   CYSTOSCOPY W/ RETROGRADES Bilateral 06/06/2015   Procedure: CYSTOSCOPY WITH RETROGRADE PYELOGRAM;  Surgeon: Christina Coyer, MD;  Location: [facility] ORS;  Service: Urology;  Laterality: Bilateral;   EYE SURGERY Left 07/06/2022   FL INJ LEFT KNEE CT ARTHROGRAM ([facility] HX) Left    1995   GASTRIC BYPASS  2010   HAND SURGERY Left 01/19/2021   Thumb   HEMORRHOID SURGERY  2013   KNEE ARTHROSCOPY Left 1996   LEFT HEART CATH AND CORONARY ANGIOGRAPHY Left 05/15/2023   Procedure: LEFT HEART CATH AND CORONARY ANGIOGRAPHY;  Surgeon: Sammy Crisp, MD;  Location: [facility] INVASIVE CV LAB;  Service: Cardiovascular;  Laterality: Left;   TONSILLECTOMY     TOTAL ABDOMINAL HYSTERECTOMY W/ BILATERAL SALPINGOOPHORECTOMY      Social History:  reports that she quit smoking about 48 years ago. Her smoking use included cigarettes. She has never used smokeless tobacco. She reports that she does not drink alcohol and does not use drugs.  Allergies  Allergen Reactions   Avelox [Moxifloxacin Hcl In Nacl] Anaphylaxis   Bactrim  [Sulfamethoxazole -Trimethoprim ] Anaphylaxis   Ciprofloxacin   Other (See Comments)    Pt states she was told never to take this as it is in the same family as Avelox.    Buspar  [Buspirone ] Other (See Comments)    hallucinations   Neomycin -Polymyxin-Dexameth Other (See Comments)    Burning in the eyes   Aquaphor [Lanolin-Petrolatum] Itching and Rash   Depakote [Divalproex Sodium] Other (See Comments)    Unknown Reaction   Imitrex  [Sumatriptan ] Other (See Comments)    Neck and shoulder pain   Stadol [Butorphanol] Rash   Family History  Problem Relation Age of Onset   Colon cancer Mother    Stroke Father    Heart disease Father    Heart failure Sister     Diabetes Paternal Grandmother    Bladder Cancer Neg Hx    Kidney disease Neg Hx    Prostate cancer Neg Hx    Kidney cancer Neg Hx    Pancreatic cancer Neg Hx    Esophageal cancer Neg Hx    Stomach cancer Neg Hx    Rectal cancer Neg Hx    Breast cancer Neg Hx    Family history: Family history reviewed and not pertinent.  Prior to Admission medications   Medication Sig Start Date End Date Taking? Authorizing Provider  ALPRAZolam  (XANAX ) 0.5 MG tablet Take 1 tablet (0.5 mg total) by mouth 2 (two) times daily as needed for anxiety. Must last 15 days. 10/04/23   Marvia Slocumb T, PA-C  amoxicillin -clavulanate (AUGMENTIN ) 875-125 MG tablet Take 1 tablet by mouth 2 (two) times daily for 7 days. 11/26/23 12/03/23  Cannady, Jolene T, NP  aspirin  EC 81 MG tablet Take 1 tablet (81 mg) by mouth once daily. Swallow whole.    [provider]  cholecalciferol  (CHOLECALCIFEROL ) 25 MCG tablet Take 1 tablet (1,000 Units total) by mouth daily. 06/30/23   Patel, Sona, MD  clopidogrel  (PLAVIX ) 75 MG tablet Take 1 tablet (75 mg total) by mouth daily with breakfast. 09/20/23   Gollan, Timothy J, MD  conjugated estrogens  (PREMARIN ) vaginal cream Place vaginally.    [provider]  doxycycline  (VIBRA -TABS) 100 MG tablet Take 1 tablet (100 mg total) by mouth 2 (two) times daily for 7 days. 11/26/23 12/03/23  Cannady, Jolene T, NP  estradiol  (ESTRACE ) 0.1 MG/GM vaginal cream Apply one pea-sized amount around the opening of the urethra 3 times weekly. 08/06/23   Vaillancourt, Samantha, PA-C  furosemide  (LASIX ) 20 MG tablet Take 1 tablet (20 mg total) by mouth daily as needed (for swelling in the legs accompanied by a weight gain of 3 pounds overnight -- USE SPARINGLY). 10/17/23 01/15/24  Roark Chick, PA-C  furosemide  (LASIX ) 40 MG tablet Take 40 mg by mouth 2 (two) times daily.    [provider]  isosorbide  mononitrate (IMDUR ) 30 MG 24 hr tablet Take 0.5 tablets (15 mg total) by mouth daily. 06/04/23  09/17/23  Roark Chick, PA-C  metoprolol  tartrate (LOPRESSOR ) 25 MG tablet Take by mouth.    [provider]  nitroGLYCERIN  (NITROSTAT ) 0.4 MG SL tablet Place 1 tablet (0.4 mg total) under the tongue every 5 (five) minutes as needed. 05/13/23   End, Veryl Gottron, MD  ondansetron  (ZOFRAN -ODT) 8 MG disintegrating tablet Take 1 tablet (8 mg total) by mouth every 8 (eight) hours as needed for nausea or vomiting. 06/18/23   Aileen Alexanders, NP  pantoprazole  (PROTONIX ) 40 MG tablet Take 1 tablet (40 mg total) by mouth daily. 06/20/23   End, Veryl Gottron, MD  potassium chloride  SA (KLOR-CON   M20) 20 MEQ tablet Take 1 tablet (20 mEq total) by mouth 2 (two) times daily. 06/19/23 09/25/23  End, Veryl Gottron, MD  pramipexole  (MIRAPEX ) 1 MG tablet Take 1 tablet (1 mg total) by mouth at bedtime. 10/24/23   Aileen Alexanders, NP  pregabalin  (LYRICA ) 75 MG capsule 1 po at bedtime for 1 week, then increase to 1 po bid. 08/28/23   Marvia Slocumb T, PA-C  QUEtiapine  (SEROQUEL ) 300 MG tablet Take 1 tablet (300 mg total) by mouth at bedtime. 10/04/23   Marvia Slocumb T, PA-C  rOPINIRole  (REQUIP ) 1 MG tablet Take 1 tablet (1 mg total) by mouth 2 (two) times daily. 10/23/23   Aileen Alexanders, NP  rosuvastatin  (CRESTOR ) 5 MG tablet TAKE 1 TABLET DAILY 09/02/23   Aileen Alexanders, NP  sertraline  (ZOLOFT ) 100 MG tablet Take 1 tablet (100 mg total) by mouth daily. 10/04/23   Marvia Slocumb T, PA-C  tirzepatide  (MOUNJARO ) 5 MG/0.5ML Pen Inject 5 mg into the skin once a week. 07/02/23   Hadassah Letters, MD  ursodiol (ACTIGALL) 300 MG capsule Take 2 capsules in the AM and 1 capsule in the PM    [provider]    Physical Exam: Vitals:   11/27/23 1111 11/27/23 1330  BP: 129/84 118/75  Pulse: (!) 116 97  Resp: 20 18  Temp: 98.3 F (36.8 C)   TempSrc: Oral   SpO2: 98% 98%   Constitutional: appears age-appropriate, frail, anxious Eyes: PERRL, lids and conjunctivae normal ENMT: Mucous membranes are moist. Posterior  pharynx clear of any exudate or lesions. Age-appropriate dentition. Hearing appropriate Neck: normal, supple, no masses, no thyromegaly Respiratory: Generalized decreased lung sounds bilaterally, no wheezing, no crackles. Normal respiratory effort. No accessory muscle use.  Cardiovascular: Regular rate and rhythm, no murmurs / rubs / gallops. No extremity edema. 2+ pedal pulses. No carotid bruits.  Abdomen: no tenderness, no masses palpated, no hepatosplenomegaly. Bowel sounds positive.  Musculoskeletal: no clubbing / cyanosis. No joint deformity upper and lower extremities. Good ROM, no contractures, no atrophy. Normal muscle tone.  Skin: no rashes, lesions, ulcers. No induration Neurologic: Sensation intact. Strength 5/5 in all 4.  Psychiatric: Normal judgment and insight. Alert and oriented x 3. Normal mood.   EKG: independently reviewed, showing sinus tachycardia with rate of 111, QTc 476  Chest x-ray on Admission: I personally reviewed and I agree with radiologist reading as below.  CT Angio Chest PE W and/or Wo Contrast Result Date: 11/27/2023 CLINICAL DATA:  Concern for pulmonary embolism. EXAM: CT ANGIOGRAPHY CHEST WITH CONTRAST TECHNIQUE: Multidetector CT imaging of the chest was performed using the standard protocol during bolus administration of intravenous contrast. Multiplanar CT image reconstructions and MIPs were obtained to evaluate the vascular anatomy. RADIATION DOSE REDUCTION: This exam was performed according to the departmental dose-optimization program which includes automated exposure control, adjustment of the mA and/or kV according to patient size and/or use of iterative reconstruction technique. CONTRAST:  75mL OMNIPAQUE  IOHEXOL  350 MG/ML SOLN COMPARISON:  Chest radiograph dated 11/26/2023. FINDINGS: Cardiovascular: There is no cardiomegaly or pericardial effusion. There is coronary vascular calcification and stent. Mild atherosclerotic calcification of the thoracic aorta. No  aneurysmal dilatation or dissection. The origins of the great vessels of the aortic arch are patent. No pulmonary artery embolus identified. Mediastinum/Nodes: No hilar or mediastinal adenopathy. Small hiatal hernia. The esophagus is grossly unremarkable. No mediastinal fluid collection. Lungs/Pleura: Bilateral patchy ground-glass densities consistent with multifocal pneumonia. Follow-up to resolution recommended. No pleural effusion or pneumothorax. The central airways  are patent. Upper Abdomen: Severe fatty liver. Postsurgical changes of gastric bypass. A 1 cm left renal upper pole stone. Musculoskeletal: Osteopenia with degenerative changes. No acute osseous pathology. Review of the MIP images confirms the above findings. IMPRESSION: 1. No CT evidence of pulmonary artery embolus. 2. Bilateral multifocal pneumonia. Follow-up to resolution recommended. 3. Severe fatty liver. 4. A 1 cm left renal upper pole stone. 5.  Aortic Atherosclerosis (ICD10-I70.0). Electronically Signed   By: Angus Bark M.D.   On: 11/27/2023 14:17   DG Chest 2 View Result Date: 11/26/2023 CLINICAL DATA:  Pneumonia. EXAM: CHEST - 2 VIEW COMPARISON:  Chest radiograph dated 08/24/2023. FINDINGS: There is diffuse interstitial coarsening and bronchitic changes. Bilateral streaky and interstitial densities consistent with pneumonia, possibly atypical in etiology. Clinical correlation is recommended. No pleural effusion or pneumothorax. The cardiac silhouette is within normal limits. No acute osseous pathology. IMPRESSION: Bilateral pneumonia. Electronically Signed   By: Angus Bark M.D.   On: 11/26/2023 11:40   Labs on Admission: I have personally reviewed following labs CBC: Recent Labs  Lab 11/26/23 1011 11/27/23 1113  WBC 4.4 5.1  NEUTROABS 3.5  --   HGB 10.9* 11.4*  HCT 35.1 35.3*  MCV 83 79.5*  PLT 301 376   Basic Metabolic Panel: Recent Labs  Lab 11/26/23 1011 11/27/23 1113  NA 134 131*  K 3.3* 3.1*  CL 99  106  CO2 16* 20*  GLUCOSE 227* 205*  BUN 6* 7*  CREATININE 0.73 0.63  CALCIUM  8.1* 8.1*   GFR: Estimated Creatinine Clearance: 49.7 mL/min (by C-G formula based on SCr of 0.63 mg/dL).  Urine analysis:    Component Value Date/Time   COLORURINE YELLOW (A) 06/28/2023 1845   APPEARANCEUR Cloudy (A) 10/31/2023 1357   LABSPEC 1.018 06/28/2023 1845   LABSPEC 1.003 01/09/2012 2126   PHURINE 6.0 06/28/2023 1845   GLUCOSEU Negative 10/31/2023 1357   GLUCOSEU Negative 01/09/2012 2126   HGBUR NEGATIVE 06/28/2023 1845   BILIRUBINUR Negative 10/31/2023 1357   BILIRUBINUR Negative 01/09/2012 2126   KETONESUR NEGATIVE 06/28/2023 1845   PROTEINUR Negative 10/31/2023 1357   PROTEINUR NEGATIVE 06/28/2023 1845   NITRITE Positive (A) 10/31/2023 1357   NITRITE NEGATIVE 06/28/2023 1845   LEUKOCYTESUR 1+ (A) 10/31/2023 1357   LEUKOCYTESUR SMALL (A) 06/28/2023 1845   LEUKOCYTESUR Negative 01/09/2012 2126   This document was prepared using Dragon Voice Recognition software and may include unintentional dictation errors.  Dr. Reinhold Carbine [facility]  If 7PM-7AM, please contact overnight-coverage provider If 7AM-7PM, please contact day attending provider [email or web address]  11/27/2023, 3:58 PM

## 2023-11-27 NOTE — Assessment & Plan Note (Signed)
 Home Imdur  15 mg daily, metoprolol  tartrate 25 mg daily were resumed on admission Hydralazine  5 mg IV every 6 hours as needed for heart rate greater than 170, 5 days ordered

## 2023-11-27 NOTE — Telephone Encounter (Signed)
 Copied from CRM [id number]. Topic: Clinical - Red Word Triage >> Nov 27, 2023  8:11 AM Amy Hansen T wrote: [facility] that prompted transfer to Nurse Triage: patient was diagnosed with double pneumonia. She has a bad cough but having a hard time catching her breath.    Chief Complaint: shortnesss of breath Symptoms: severe shortness of breath Frequency: constant Pertinent Negatives: Patient denies . Disposition: [x] ED /[] Urgent Care (no appt availability in office) / [] Appointment(In office/virtual)/ []  [place] [facility]/ [] Home Care/ [] Refused Recommended Disposition /[] [place] [facility]/ []  Follow-up with PCP Additional Notes: Unable to complete triage assessment. Pt reporting increased shortness of breath after OV yesterday, worsening through the night and severe this morning. RN noted patient speaking in single words and sounds short of breath. RN advising ED. Pt is agreeable. RN offered to call EMS, pt declined states that she will have her husband or granddaughter take her.    Additional Information  Commented on: All Negative - Go to ED Now (Notify PCP)    Worse3ning shortness of breath  Answer Assessment - Initial Assessment Questions 1. SYMPTOM: "What's the main symptom you're concerned about?" (e.g., breathing difficulty, fever, weakness)     Breathing difficulty  2. ONSET: "When did the  *No Answer*  start?"     *No Answer* 3. BETTER-SAME-WORSE: "Are you getting better, staying the same, or getting worse compared to the day you were discharged?"     Getting worse from yesterday  4. BREATHING DIFFICULTY: "Are you having any difficulty breathing?" If Yes, ask: "How bad is it?"  (e.g., none, mild, moderate, severe)   - MILD: No SOB at rest, mild SOB with walking, speaks normally in sentences, can lie down, no retractions, pulse < 100.    - MODERATE: SOB at rest, SOB with minimal exertion and prefers to sit, cannot lie down flat, speaks in phrases, mild retractions, audible  wheezing, pulse 100-120.    - SEVERE: Very SOB at rest, speaks in single words, struggling to breathe, sitting hunched forward, retractions, pulse > 120      *No Answer* 5. FEVER: "Do you have a fever?" If Yes, ask: "What is your temperature, how was it measured, and when did it start?"     *No Answer* 6. SPUTUM: "Describe the color of your sputum" (clear, white, yellow, green, blood-tinged)     *No Answer* 7. DIAGNOSIS CONFIRMATION: "When was the pneumonia diagnosed?" "By whom?"     *No Answer* 8. ANTIBIOTIC: "Are you taking an antibiotic?"  If Yes, ask: "Which one?" "When was it started?"     *No Answer* 9. OTHER TREATMENT: "Are you receiving any other treatment for the pneumonia?" (e.g., albuterol  nebulizer, oxygen) If Yes, ask: "How often?" and "Does it help?"     *No Answer* 10. HOSPITAL ADMISSION: "Were you hospitalized for this pneumonia?" If Yes, ask: "When were you discharged home from the hospital?"       *No Answer* 11. O2 SATURATION MONITOR:  "Do you use an oxygen saturation monitor (pulse oximeter) at home?" If Yes, "What is your reading (oxygen level) today?" "What is your usual oxygen saturation reading?" (e.g., 95%)       *No Answer*  Protocols used: Pneumonia Follow-up Call-A-[facility]

## 2023-11-27 NOTE — Assessment & Plan Note (Signed)
 Plavix  75 mg daily, rosuvastatin  5 mg nightly resumed

## 2023-11-27 NOTE — Assessment & Plan Note (Addendum)
 Continue with ceftriaxone  2 g IV daily, azithromycin 500 mg IV daily to complete a 5-day course DuoNebs every 6 hours as needed for shortness of breath and wheezing, 3 days ordered Tussionex p.o. nightly as needed for cough, 2 days ordered Guaifenesin 5 mL p.o. every 6 hours as needed to loosen phlegm, 3 days ordered LR 125 mg IV continuously, 1 day Oxygen supplementation as needed to maintain SpO2 greater than 92% Flutter valve, incentive spirometry

## 2023-11-27 NOTE — Assessment & Plan Note (Signed)
 Home sertraline  100 mg daily, alprazolam  0.5 mg p.o. twice daily as needed for anxiety

## 2023-11-27 NOTE — Assessment & Plan Note (Signed)
 Home PPI resumed

## 2023-11-27 NOTE — Assessment & Plan Note (Signed)
 Home ropinirole  1 mg p.o. twice daily resumed

## 2023-11-27 NOTE — ED Triage Notes (Signed)
 Pt to ED via POV from home. Pt reports aches, cough, fever and SOB. Pt reports seen at PCP yesterday and did chest xray and dx with PNA. Pt reports here due to worsening SOB.

## 2023-11-27 NOTE — Telephone Encounter (Signed)
 Patient currently waiting in the ER.

## 2023-11-27 NOTE — Assessment & Plan Note (Signed)
 Insulin  SSI with at bedtime coverage ordered on admission Goal inpatient blood glucose levels 140-180

## 2023-11-27 NOTE — Progress Notes (Signed)
 Contacted via MyChart   Good afternoon Amy Hansen, your labs returned and definitely some levels that are off.  Hemoglobin a little low this check.  Potassium a little low and glucose elevated.  I know you are in ER currently for which I am glad you went, I think based on hearing your symptoms are worsening that is the best decision.  Any questions? Keep being amazing!!  Thank you for allowing me to participate in your care.  I appreciate you. Kindest regards, Evens Meno

## 2023-11-27 NOTE — ED Provider Notes (Signed)
 [facility] Provider Note    Event Date/Time   First MD Initiated Contact with Patient 11/27/23 1254     (approximate)  History   Chief Complaint: Pneumonia  HPI  Amy Hansen is a 68 y.o. female with a past medical history of CHF, anxiety, anemia, diabetes, presents to the emergency department for worsening cough and shortness of breath.  According to the patient for the past 1 week she has had cough and shortness of breath.  She went to her doctor yesterday who ordered a chest x-ray that showed bilateral pneumonia and the patient was started on antibiotics.  Patient states she has had low-grade fevers at home around 100.0.  Afebrile currently.  Patient states some chest pain mostly with coughing or attempting to take deep breaths.  Patient states he felt more short of breath today so she came to the emergency department for evaluation.  Physical Exam   Triage Vital Signs: ED Triage Vitals [11/27/23 1111]  Encounter Vitals Group     BP 129/84     Systolic BP Percentile      Diastolic BP Percentile      Pulse Rate (!) 116     Resp 20     Temp 98.3 F (36.8 C)     Temp Source Oral     SpO2 98 %     Weight      Height      Head Circumference      Peak Flow      Pain Score 2     Pain Loc      Pain Education      Exclude from Growth Chart     Most recent vital signs: Vitals:   11/27/23 1111 11/27/23 1330  BP: 129/84 118/75  Pulse: (!) 116 97  Resp: 20 18  Temp: 98.3 F (36.8 C)   SpO2: 98% 98%    General: Awake, no distress.  Occasional cough during exam. CV:  Good peripheral perfusion.  Regular rate and rhythm  Resp:  Normal effort.  Equal breath sounds bilaterally.  No obvious wheeze rales or rhonchi. Abd:  No distention.  Soft, nontender.  No rebound or guarding.  ED Results / Procedures / Treatments   EKG  EKG viewed and interpreted by myself shows sinus tachycardia at 111 bpm with a narrow QRS, left axis deviation, largely normal  intervals, no concerning ST changes.  RADIOLOGY  I reviewed the chest x-ray from yesterday there are some mild opacities bilaterally although they look fairly minimal.   MEDICATIONS ORDERED IN ED: Medications  ipratropium-albuterol  (DUONEB) 0.5-2.5 (3) MG/3ML nebulizer solution 3 mL (3 mLs Nebulization Given 11/27/23 1333)  chlorpheniramine-HYDROcodone  (TUSSIONEX) 10-8 MG/5ML suspension 5 mL (5 mLs Oral Given 11/27/23 1333)  iohexol  (OMNIPAQUE ) 350 MG/ML injection 75 mL (75 mLs Intravenous Contrast Given 11/27/23 1344)     IMPRESSION / MDM / ASSESSMENT AND PLAN / ED COURSE  I reviewed the triage vital signs and the nursing notes.  Patient's presentation is most consistent with acute presentation with potential threat to life or bodily function.  Patient presents emergency department for cough and worsening shortness of breath over the past 1 week.  Patient diagnosed with pneumonia yesterday that started her on oral antibiotics.  Patient states worsening shortness of breath much worse with any attempted movement.  Chest x-ray yesterday does not appear to show any significant opacity although there are small opacities seen.  Given the patient's worsening symptoms and minimal findings  on chest x-ray we will proceed with a CTA of the chest to rule out pulmonary embolism.  Patient agreeable to plan.  This will also allow us  to get a better view of the lungs to evaluate for more advanced infection.  Patient's workup today shows a reassuring CBC with a normal white blood cell count, reassuring chemistry.  Negative troponin x 2.  I have reviewed the CT of the chest.  Patient appears to have multifocal pneumonia.  We will start on IV antibiotics.  I do not appreciate a large PE.  Awaiting radiology read.  CT confirms multifocal pneumonia.  FINAL CLINICAL IMPRESSION(S) / ED DIAGNOSES   Multifocal pneumonia     Note:  This document was prepared using Dragon voice recognition software and may  include unintentional dictation errors.   Ruth Cove, MD 11/27/23 1435

## 2023-11-28 DIAGNOSIS — J189 Pneumonia, unspecified organism: Secondary | ICD-10-CM | POA: Diagnosis not present

## 2023-11-28 DIAGNOSIS — F419 Anxiety disorder, unspecified: Secondary | ICD-10-CM

## 2023-11-28 LAB — BASIC METABOLIC PANEL WITH GFR
Anion gap: 5 (ref 5–15)
BUN: <5 mg/dL — ABNORMAL LOW (ref 8–23)
CO2: 22 mmol/L (ref 22–32)
Calcium: 7.7 mg/dL — ABNORMAL LOW (ref 8.9–10.3)
Chloride: 110 mmol/L (ref 98–111)
Creatinine, Ser: 0.54 mg/dL (ref 0.44–1.00)
GFR, Estimated: >60 mL/min (ref >60)
Glucose, Bld: 109 mg/dL — ABNORMAL HIGH (ref 70–99)
Potassium: 2.6 mmol/L — CL (ref 3.5–5.1)
Sodium: 137 mmol/L (ref 135–145)

## 2023-11-28 LAB — CBC
HCT: 33.1 % — ABNORMAL LOW (ref 36.0–46.0)
Hemoglobin: 11 g/dL — ABNORMAL LOW (ref 12.0–15.0)
MCH: 26.4 pg (ref 26.0–34.0)
MCHC: 33.2 g/dL (ref 30.0–36.0)
MCV: 79.6 fL — ABNORMAL LOW (ref 80.0–100.0)
Platelets: 395 10*3/uL (ref 150–400)
RBC: 4.16 MIL/uL (ref 3.87–5.11)
RDW: 15.9 % — ABNORMAL HIGH (ref 11.5–15.5)
WBC: 4.9 10*3/uL (ref 4.0–10.5)
nRBC: 0 % (ref 0.0–0.2)

## 2023-11-28 LAB — GLUCOSE, CAPILLARY
Glucose-Capillary: 111 mg/dL — ABNORMAL HIGH (ref 70–99)
Glucose-Capillary: 137 mg/dL — ABNORMAL HIGH (ref 70–99)

## 2023-11-28 LAB — MAGNESIUM: Magnesium: 2.1 mg/dL (ref 1.7–2.4)

## 2023-11-28 LAB — HEMOGLOBIN A1C
Hgb A1c MFr Bld: 9.8 % — ABNORMAL HIGH (ref 4.8–5.6)
Mean Plasma Glucose: 234.56 mg/dL

## 2023-11-28 MED ORDER — POTASSIUM CHLORIDE 10 MEQ/100ML IV SOLN
10.0000 meq | INTRAVENOUS | Status: AC
  Administered 2023-11-28 (×4): 10 meq via INTRAVENOUS
  Filled 2023-11-28: qty 100

## 2023-11-28 MED ORDER — ALBUTEROL SULFATE HFA 108 (90 BASE) MCG/ACT IN AERS: 2.0000 | INHALATION_SPRAY | Freq: Four times a day (QID) | RESPIRATORY_TRACT | 2 refills | Status: DC | PRN

## 2023-11-28 MED ORDER — POTASSIUM CHLORIDE 20 MEQ PO PACK
40.0000 meq | PACK | Freq: Two times a day (BID) | ORAL | Status: DC
  Administered 2023-11-28: 40 meq via ORAL
  Filled 2023-11-28: qty 2

## 2023-11-28 MED ORDER — GUAIFENESIN 100 MG/5ML PO LIQD: 5.0000 mL | Freq: Four times a day (QID) | ORAL | 0 refills | Status: DC | PRN

## 2023-11-28 MED ORDER — AZITHROMYCIN 500 MG PO TABS: 500.0000 mg | ORAL_TABLET | Freq: Every day | ORAL | 0 refills | Status: DC

## 2023-11-28 MED ORDER — AMOXICILLIN-POT CLAVULANATE 875-125 MG PO TABS: 1.0000 | ORAL_TABLET | Freq: Two times a day (BID) | ORAL | 0 refills | Status: DC

## 2023-11-28 NOTE — Discharge Summary (Signed)
 Physician Discharge Summary   Patient: Amy Hansen MRN: [id number] DOB: 07/29/1956  Admit date:     11/27/2023  Discharge date: 11/28/23  Discharge Physician: Jodeane Mulligan   PCP: Aileen Alexanders, NP   Recommendations at discharge:    Pt to be discharged home.   If you experience worsening fever, chills, chest pain, shortness of breath, or other concerning symptoms, please call your PCP or go to the emergency department immediately.  Discharge Diagnoses: Principal Problem:   Multilobar lung infiltrate Active Problems:   Coronary artery disease   Peripheral neuropathy   Restless legs syndrome (RLS)   Hypertension associated with type 2 diabetes mellitus (HCC)   GERD (gastroesophageal reflux disease)   Major depression, chronic   OCD (obsessive compulsive disorder)   Anxiety disorder   Pyelonephritis   Type 2 diabetes mellitus with hyperglycemia (HCC)   At risk for polypharmacy  Resolved Problems:   * No resolved hospital problems. *   Hospital Course:  68 year old female with history of hypertension, hyperlipidemia, depression, neuropathy, who presents emergency department from home for chief concerns of cough and shortness of breath with fever at home.   Assessment and Plan:   Focal pneumonia -Noted on CT.  Initiated on empiric ceftriaxone  plus azithromycin .  IV fluids, nebulizers.  Patient showing remarkable improvement in her symptoms, breathing easier.  Will transition patient over to p.o. Augmentin  plus azithromycin  to take as directed upon discharge.  Was given prescription for guaifenesin  and albuterol  MDI.  Coronary artery disease -Plavix  75 mg daily, rosuvastatin  5 mg nightly resumed   Type 2 diabetes mellitus with hyperglycemia -Resume home medication regimen.  Anxiety disorder -Resume home regiment   GERD (gastroesophageal reflux disease) -Home PPI resumed   Hypertension associated with type 2 diabetes mellitus (HCC) -Resume home medication  regiment   Restless legs syndrome (RLS) Home ropinirole  1 mg p.o. twice daily resumed  Consultants: None Procedures performed: None Disposition: Home Diet recommendation:  Discharge Diet Orders (From admission, onward)     Start     Ordered   11/28/23 0000  Diet - low sodium heart healthy        11/28/23 1050           Cardiac and Carb modified diet  DISCHARGE MEDICATION: Allergies as of 11/28/2023       Reactions   Avelox [moxifloxacin Hcl In Nacl] Anaphylaxis   Bactrim  [sulfamethoxazole -trimethoprim ] Anaphylaxis   Ciprofloxacin  Other (See Comments)   Pt states she was told never to take this as it is in the same family as Avelox.    Buspar  [buspirone ] Other (See Comments)   hallucinations   Neomycin -polymyxin-dexameth Other (See Comments)   Burning in the eyes   Aquaphor [lanolin-petrolatum] Itching, Rash   Depakote [divalproex Sodium] Other (See Comments)   Unknown Reaction   Imitrex  [sumatriptan ] Other (See Comments)   Neck and shoulder pain   Stadol [butorphanol] Rash        Medication List     STOP taking these medications    doxycycline  100 MG tablet Commonly known as: VIBRA -TABS       TAKE these medications    albuterol  108 (90 Base) MCG/ACT inhaler Commonly known as: VENTOLIN  HFA Inhale 2 puffs into the lungs every 6 (six) hours as needed for wheezing or shortness of breath.   ALPRAZolam  0.5 MG tablet Commonly known as: XANAX  Take 1 tablet (0.5 mg total) by mouth 2 (two) times daily as needed for anxiety. Must last 15 days.  amoxicillin -clavulanate 875-125 MG tablet Commonly known as: AUGMENTIN  Take 1 tablet by mouth 2 (two) times daily.   aspirin  EC 81 MG tablet Take 1 tablet (81 mg) by mouth once daily. Swallow whole.   azithromycin  500 MG tablet Commonly known as: Zithromax  Take 1 tablet (500 mg total) by mouth daily.   clopidogrel  75 MG tablet Commonly known as: PLAVIX  Take 1 tablet (75 mg total) by mouth daily with  breakfast.   conjugated estrogens  vaginal cream Commonly known as: PREMARIN  Place vaginally.   estradiol  0.1 MG/GM vaginal cream Commonly known as: ESTRACE  Apply one pea-sized amount around the opening of the urethra 3 times weekly.   furosemide  20 MG tablet Commonly known as: LASIX  Take 1 tablet (20 mg total) by mouth daily as needed (for swelling in the legs accompanied by a weight gain of 3 pounds overnight -- USE SPARINGLY). What changed: Another medication with the same name was removed. Continue taking this medication, and follow the directions you see here.   guaiFENesin  100 MG/5ML liquid Commonly known as: ROBITUSSIN Take 5 mLs by mouth every 6 (six) hours as needed for to loosen phlegm.   isosorbide  mononitrate 30 MG 24 hr tablet Commonly known as: IMDUR  Take 0.5 tablets (15 mg total) by mouth daily.   metoprolol  tartrate 25 MG tablet Commonly known as: LOPRESSOR  Take by mouth.   nitroGLYCERIN  0.4 MG SL tablet Commonly known as: NITROSTAT  Place 1 tablet (0.4 mg total) under the tongue every 5 (five) minutes as needed.   ondansetron  8 MG disintegrating tablet Commonly known as: ZOFRAN -ODT Take 1 tablet (8 mg total) by mouth every 8 (eight) hours as needed for nausea or vomiting.   pantoprazole  40 MG tablet Commonly known as: PROTONIX  Take 1 tablet (40 mg total) by mouth daily.   potassium chloride  SA 20 MEQ tablet Commonly known as: Klor-Con  M20 Take 1 tablet (20 mEq total) by mouth 2 (two) times daily.   pramipexole  1 MG tablet Commonly known as: MIRAPEX  Take 1 tablet (1 mg total) by mouth at bedtime.   pregabalin  75 MG capsule Commonly known as: Lyrica  1 po at bedtime for 1 week, then increase to 1 po bid.   QUEtiapine  300 MG tablet Commonly known as: SEROQUEL  Take 1 tablet (300 mg total) by mouth at bedtime.   rOPINIRole  1 MG tablet Commonly known as: REQUIP  Take 1 tablet (1 mg total) by mouth 2 (two) times daily.   rosuvastatin  5 MG  tablet Commonly known as: CRESTOR  TAKE 1 TABLET DAILY   sertraline  100 MG tablet Commonly known as: ZOLOFT  Take 1 tablet (100 mg total) by mouth daily.   tirzepatide  5 MG/0.5ML Pen Commonly known as: MOUNJARO  Inject 5 mg into the skin once a week.   ursodiol 300 MG capsule Commonly known as: ACTIGALL Take 2 capsules in the AM and 1 capsule in the PM   vitamin D3 25 MCG tablet Commonly known as: CHOLECALCIFEROL  Take 1 tablet (1,000 Units total) by mouth daily.         Discharge Exam: There were no vitals filed for this visit.  GENERAL:  Alert, pleasant, no acute distress, thin HEENT:  EOMI CARDIOVASCULAR:  RRR, no murmurs appreciated RESPIRATORY:  Clear to auscultation, no wheezing, rales, or rhonchi GASTROINTESTINAL:  Soft, nontender, nondistended EXTREMITIES:  No LE edema bilaterally NEURO:  No new focal deficits appreciated SKIN:  No rashes noted PSYCH:  Appropriate mood and affect    Condition at discharge: improving  The results of significant diagnostics from  this hospitalization (including imaging, microbiology, ancillary and laboratory) are listed below for reference.   Imaging Studies: CT Angio Chest PE W and/or Wo Contrast Result Date: 11/27/2023 CLINICAL DATA:  Concern for pulmonary embolism. EXAM: CT ANGIOGRAPHY CHEST WITH CONTRAST TECHNIQUE: Multidetector CT imaging of the chest was performed using the standard protocol during bolus administration of intravenous contrast. Multiplanar CT image reconstructions and MIPs were obtained to evaluate the vascular anatomy. RADIATION DOSE REDUCTION: This exam was performed according to the departmental dose-optimization program which includes automated exposure control, adjustment of the mA and/or kV according to patient size and/or use of iterative reconstruction technique. CONTRAST:  75mL OMNIPAQUE  IOHEXOL  350 MG/ML SOLN COMPARISON:  Chest radiograph dated 11/26/2023. FINDINGS: Cardiovascular: There is no cardiomegaly  or pericardial effusion. There is coronary vascular calcification and stent. Mild atherosclerotic calcification of the thoracic aorta. No aneurysmal dilatation or dissection. The origins of the great vessels of the aortic arch are patent. No pulmonary artery embolus identified. Mediastinum/Nodes: No hilar or mediastinal adenopathy. Small hiatal hernia. The esophagus is grossly unremarkable. No mediastinal fluid collection. Lungs/Pleura: Bilateral patchy ground-glass densities consistent with multifocal pneumonia. Follow-up to resolution recommended. No pleural effusion or pneumothorax. The central airways are patent. Upper Abdomen: Severe fatty liver. Postsurgical changes of gastric bypass. A 1 cm left renal upper pole stone. Musculoskeletal: Osteopenia with degenerative changes. No acute osseous pathology. Review of the MIP images confirms the above findings. IMPRESSION: 1. No CT evidence of pulmonary artery embolus. 2. Bilateral multifocal pneumonia. Follow-up to resolution recommended. 3. Severe fatty liver. 4. A 1 cm left renal upper pole stone. 5.  Aortic Atherosclerosis (ICD10-I70.0). Electronically Signed   By: Angus Bark M.D.   On: 11/27/2023 14:17   DG Chest 2 View Result Date: 11/26/2023 CLINICAL DATA:  Pneumonia. EXAM: CHEST - 2 VIEW COMPARISON:  Chest radiograph dated 08/24/2023. FINDINGS: There is diffuse interstitial coarsening and bronchitic changes. Bilateral streaky and interstitial densities consistent with pneumonia, possibly atypical in etiology. Clinical correlation is recommended. No pleural effusion or pneumothorax. The cardiac silhouette is within normal limits. No acute osseous pathology. IMPRESSION: Bilateral pneumonia. Electronically Signed   By: Angus Bark M.D.   On: 11/26/2023 11:40   US  RENAL Result Date: 11/07/2023 CLINICAL DATA:  Left lower quadrant abdomen pain.  Gross hematuria. EXAM: RENAL / URINARY TRACT ULTRASOUND COMPLETE COMPARISON:  CT abdomen and pelvis February 11, 2023 FINDINGS: Right Kidney: Renal measurements: 10.4 x 5.5 x 5.3 cm = volume: 157.7 mL. Echogenicity within normal limits. No mass or hydronephrosis visualized. Left Kidney: Renal measurements: 9.6 x 5.2 x 4.9 cm = volume: 129.3 mL. There is a 1 cm nonobstructing stone in the left kidney. Echogenicity within normal limits. No mass or hydronephrosis visualized. Bladder: Appears normal for degree of bladder distention. Other: None. IMPRESSION: 1. No acute abnormality identified. 2. 1 cm nonobstructing stone in the left kidney. Electronically Signed   By: Anna Barnes M.D.   On: 11/07/2023 13:59    Microbiology: Results for orders placed or performed during the hospital encounter of 11/27/23  Blood Culture (routine x 2)     Status: None (Preliminary result)   Collection Time: 11/27/23  2:07 PM   Specimen: BLOOD  Result Value Ref Range Status   Specimen Description BLOOD RIGHT ANTECUBITAL  Final   Special Requests   Final    BOTTLES DRAWN AEROBIC AND ANAEROBIC Blood Culture results may not be optimal due to an inadequate volume of blood received in culture bottles   Culture  Final    NO GROWTH < 24 HOURS Performed at [facility], [address] [place], [place] [address]    Report Status PENDING  Incomplete  Resp panel by RT-PCR (RSV, Flu A&B, Covid) Anterior Nasal Swab     Status: None   Collection Time: 11/27/23  2:08 PM   Specimen: Anterior Nasal Swab  Result Value Ref Range Status   SARS Coronavirus 2 by RT PCR NEGATIVE NEGATIVE Final    Comment: (NOTE) SARS-CoV-2 target nucleic acids are NOT DETECTED.  The SARS-CoV-2 RNA is generally detectable in upper respiratory specimens during the acute phase of infection. The lowest concentration of SARS-CoV-2 viral copies this assay can detect is 138 copies/mL. A negative result does not preclude SARS-Cov-2 infection and should not be used as the sole basis for treatment or other patient management decisions. A negative result  may occur with  improper specimen collection/handling, submission of specimen other than nasopharyngeal swab, presence of viral mutation(s) within the areas targeted by this assay, and inadequate number of viral copies(<138 copies/mL). A negative result must be combined with clinical observations, patient history, and epidemiological information. The expected result is Negative.  Fact Sheet for Patients:  BloggerCourse.com  Fact Sheet for Healthcare Providers:  SeriousBroker.it  This test is no t yet approved or cleared by the [place]  FDA and  has been authorized for detection and/or diagnosis of SARS-CoV-2 by FDA under an Emergency Use Authorization (EUA). This EUA will remain  in effect (meaning this test can be used) for the duration of the COVID-19 declaration under Section 564(b)(1) of the Act, 21 U.S.C.section 360bbb-3(b)(1), unless the authorization is terminated  or revoked sooner.       Influenza A by PCR NEGATIVE NEGATIVE Final   Influenza B by PCR NEGATIVE NEGATIVE Final    Comment: (NOTE) The Xpert Xpress SARS-CoV-2/FLU/RSV plus assay is intended as an aid in the diagnosis of influenza from Nasopharyngeal swab specimens and should not be used as a sole basis for treatment. Nasal washings and aspirates are unacceptable for Xpert Xpress SARS-CoV-2/FLU/RSV testing.  Fact Sheet for Patients: BloggerCourse.com  Fact Sheet for Healthcare Providers: SeriousBroker.it  This test is not yet approved or cleared by the [place]  FDA and has been authorized for detection and/or diagnosis of SARS-CoV-2 by FDA under an Emergency Use Authorization (EUA). This EUA will remain in effect (meaning this test can be used) for the duration of the COVID-19 declaration under Section 564(b)(1) of the Act, 21 U.S.C. section 360bbb-3(b)(1), unless the authorization is terminated  or revoked.     Resp Syncytial Virus by PCR NEGATIVE NEGATIVE Final    Comment: (NOTE) Fact Sheet for Patients: BloggerCourse.com  Fact Sheet for Healthcare Providers: SeriousBroker.it  This test is not yet approved or cleared by the [place]  FDA and has been authorized for detection and/or diagnosis of SARS-CoV-2 by FDA under an Emergency Use Authorization (EUA). This EUA will remain in effect (meaning this test can be used) for the duration of the COVID-19 declaration under Section 564(b)(1) of the Act, 21 U.S.C. section 360bbb-3(b)(1), unless the authorization is terminated or revoked.  Performed at [facility], [address] [place], [place] [address]   Blood Culture (routine x 2)     Status: None (Preliminary result)   Collection Time: 11/27/23  2:10 PM   Specimen: BLOOD  Result Value Ref Range Status   Specimen Description BLOOD BLOOD RIGHT FOREARM  Final   Special Requests   Final  BOTTLES DRAWN AEROBIC AND ANAEROBIC Blood Culture results may not be optimal due to an inadequate volume of blood received in culture bottles   Culture   Final    NO GROWTH < 24 HOURS Performed at [facility], [address] [place], [place] [address]    Report Status PENDING  Incomplete   *Note: Due to a large number of results and/or encounters for the requested time period, some results have not been displayed. A complete set of results can be found in Results Review.    Labs: CBC: Recent Labs  Lab 11/26/23 1011 11/27/23 1113 11/28/23 0531  WBC 4.4 5.1 4.9  NEUTROABS 3.5  --   --   HGB 10.9* 11.4* 11.0*  HCT 35.1 35.3* 33.1*  MCV 83 79.5* 79.6*  PLT 301 376 395   Basic Metabolic Panel: Recent Labs  Lab 11/26/23 1011 11/27/23 1113 11/28/23 0531  NA 134 131* 137  K 3.3* 3.1* 2.6*  CL 99 106 110  CO2 16* 20* 22  GLUCOSE 227* 205* 109*  BUN 6* 7* <5*  CREATININE 0.73 0.63 0.54  CALCIUM   8.1* 8.1* 7.7*  MG  --   --  2.1   Liver Function Tests: No results for input(s): "AST", "ALT", "ALKPHOS", "BILITOT", "PROT", "ALBUMIN" in the last 168 hours. CBG: Recent Labs  Lab 11/27/23 1647 11/27/23 2023 11/28/23 0810  GLUCAP 163* 133* 111*    Discharge time spent: 26 minutes.  Signed: Jodeane Mulligan, DO [facility] 11/28/2023

## 2023-11-28 NOTE — TOC Transition Note (Signed)
 Transition of Care [facility]) - Discharge Note   Patient Details  Name: Amy Hansen MRN: [id number] Date of Birth: 01/08/1956  [facility] CM/SW Contact:  Alexandra Ice, RN Phone Number: 11/28/2023, 12:02 PM   Clinical Narrative:    Patient to discharge home. She lives with spouse. No TOC needs identified.     Barriers to Discharge: Barriers Resolved   Patient Goals and CMS Choice        Expected Discharge Plan and Services           Expected Discharge Date: 11/28/23                 DME Agency: NA       HH Arranged: NA          Prior Living Arrangements/Services                       Activities of Daily Living   ADL Screening (condition at time of admission) Independently performs ADLs?: Yes (appropriate for developmental age) Is the patient deaf or have difficulty hearing?: No Does the patient have difficulty seeing, even when wearing glasses/contacts?: No Does the patient have difficulty concentrating, remembering, or making decisions?: No  Permission Sought/Granted                  Emotional Assessment              Admission diagnosis:  Multifocal pneumonia [J18.9] Multilobar lung infiltrate [R91.8] Patient Active Problem List   Diagnosis Date Noted   Multilobar lung infiltrate 11/27/2023   At risk for polypharmacy 11/27/2023   Subacute cough 11/26/2023   Coronary artery disease 06/28/2023   Peripheral neuropathy 06/28/2023   Coronary artery disease involving native coronary artery of native heart with unstable angina pectoris (HCC) 05/13/2023   PSVT (paroxysmal supraventricular tachycardia) (HCC) 04/13/2023   Pyelonephritis 02/18/2023   Anxiety disorder 02/05/2023   Dysphagia 02/05/2023   Benzodiazepine dependence (HCC) 02/02/2023   Allergic dermatitis 08/29/2022   History of stroke 05/01/2022   Hyperlipidemia LDL goal <70 04/30/2022   Memory changes 01/18/2022   Primary osteoarthritis of right knee  08/27/2021   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/2020   Major depression, chronic 06/01/2018   OCD (obsessive compulsive disorder) 06/01/2018   GERD (gastroesophageal reflux disease) 02/18/2017   Iron deficiency anemia 10/18/2016   Hypertension associated with type 2 diabetes mellitus (HCC) 03/21/2016   Vitamin D  deficiency 02/21/2016   B12 deficiency 02/21/2016   Type 2 diabetes mellitus with hyperglycemia (HCC) 09/13/2015   Migraines 05/23/2015   Restless legs syndrome (RLS) 05/23/2015   Recurrent UTI 04/26/2015   Atrophic vaginitis 04/26/2015   PCP:  Aileen Alexanders, NP Pharmacy:   [facility] - [place], [place] - [address] [place] [address] Phone: [phone] Fax: [phone]  [facility] [address] [place], [place] - [address] [address] Phone: [phone] Fax: [phone]     Social Determinants of Health (SDOH) Interventions    Readmission Risk Interventions     No data to display           Final next level of care: Home/Self Care Barriers to Discharge: Barriers Resolved   Patient Goals and CMS Choice            Discharge Placement                  Name of family  member notified: Lenise Quince, spouse Patient and family notified of of transfer: 11/28/23  Discharge Plan and Services Additional resources added to the After Visit Summary for                    DME Agency: NA       HH Arranged: NA          Social Drivers of Health (SDOH) Interventions SDOH Screenings   Food Insecurity: No Food Insecurity (11/27/2023)  Housing: Low Risk  (11/27/2023)  Transportation Needs: No Transportation Needs (11/27/2023)  Utilities: Not At Risk (11/27/2023)  Alcohol Screen: Low Risk  (03/06/2022)  Depression (PHQ2-9): Low Risk  (10/07/2023)  Recent Concern: Depression (PHQ2-9) - High Risk (07/11/2023)  Financial Resource Strain: Medium Risk (07/09/2023)  Physical Activity: Insufficiently Active  (07/09/2023)  Social Connections: Socially Integrated (11/27/2023)  Stress: Stress Concern Present (07/09/2023)  Tobacco Use: Medium Risk (11/27/2023)     Readmission Risk Interventions     No data to display

## 2023-11-28 NOTE — Progress Notes (Signed)
 1. No-show for appointment   No show #1.

## 2023-11-28 NOTE — Plan of Care (Signed)
   Problem: Coping: Goal: Ability to adjust to condition or change in health will improve Outcome: Progressing   Problem: Nutritional: Goal: Maintenance of adequate nutrition will improve Outcome: Progressing

## 2023-11-28 NOTE — Care Management Obs Status (Signed)
 MEDICARE OBSERVATION STATUS NOTIFICATION   Patient Details  Name: Amy Hansen MRN: [id number] Date of Birth: June 28, 1956   Medicare Observation Status Notification Given:  Rudolph Cost, CMA 11/28/2023, 10:55 AM

## 2023-11-29 ENCOUNTER — Encounter: Payer: Self-pay | Admitting: Nurse Practitioner

## 2023-11-29 ENCOUNTER — Ambulatory Visit (INDEPENDENT_AMBULATORY_CARE_PROVIDER_SITE_OTHER): Admitting: Nurse Practitioner

## 2023-11-29 ENCOUNTER — Ambulatory Visit: Admitting: Nurse Practitioner

## 2023-11-29 ENCOUNTER — Inpatient Hospital Stay
Admission: EM | Admit: 2023-11-29 | Discharge: 2023-12-01 | DRG: 871 | Disposition: A | Discharge status: Home / Self Care | Attending: Internal Medicine | Admitting: Internal Medicine

## 2023-11-29 ENCOUNTER — Other Ambulatory Visit: Payer: Self-pay

## 2023-11-29 ENCOUNTER — Emergency Department

## 2023-11-29 VITALS — BP 128/82 | HR 128 | Temp 98.0°F | Resp 20 | Wt 111.0 lb

## 2023-11-29 DIAGNOSIS — Z7985 Long-term (current) use of injectable non-insulin antidiabetic drugs: Secondary | ICD-10-CM

## 2023-11-29 DIAGNOSIS — Z1152 Encounter for screening for COVID-19: Secondary | ICD-10-CM

## 2023-11-29 DIAGNOSIS — E1159 Type 2 diabetes mellitus with other circulatory complications: Secondary | ICD-10-CM | POA: Diagnosis present

## 2023-11-29 DIAGNOSIS — Z8701 Personal history of pneumonia (recurrent): Secondary | ICD-10-CM

## 2023-11-29 DIAGNOSIS — I5033 Acute on chronic diastolic (congestive) heart failure: Secondary | ICD-10-CM | POA: Diagnosis not present

## 2023-11-29 DIAGNOSIS — Z87891 Personal history of nicotine dependence: Secondary | ICD-10-CM

## 2023-11-29 DIAGNOSIS — J9601 Acute respiratory failure with hypoxia: Secondary | ICD-10-CM | POA: Diagnosis present

## 2023-11-29 DIAGNOSIS — Z9049 Acquired absence of other specified parts of digestive tract: Secondary | ICD-10-CM

## 2023-11-29 DIAGNOSIS — E1165 Type 2 diabetes mellitus with hyperglycemia: Secondary | ICD-10-CM | POA: Insufficient documentation

## 2023-11-29 DIAGNOSIS — Z91199 Patient's noncompliance with other medical treatment and regimen due to unspecified reason: Secondary | ICD-10-CM

## 2023-11-29 DIAGNOSIS — Z90710 Acquired absence of both cervix and uterus: Secondary | ICD-10-CM

## 2023-11-29 DIAGNOSIS — E876 Hypokalemia: Secondary | ICD-10-CM | POA: Diagnosis present

## 2023-11-29 DIAGNOSIS — I2510 Atherosclerotic heart disease of native coronary artery without angina pectoris: Secondary | ICD-10-CM | POA: Diagnosis present

## 2023-11-29 DIAGNOSIS — E785 Hyperlipidemia, unspecified: Secondary | ICD-10-CM | POA: Diagnosis present

## 2023-11-29 DIAGNOSIS — N12 Tubulo-interstitial nephritis, not specified as acute or chronic: Secondary | ICD-10-CM | POA: Diagnosis present

## 2023-11-29 DIAGNOSIS — K5090 Crohn's disease, unspecified, without complications: Secondary | ICD-10-CM | POA: Diagnosis present

## 2023-11-29 DIAGNOSIS — I110 Hypertensive heart disease with heart failure: Secondary | ICD-10-CM | POA: Diagnosis present

## 2023-11-29 DIAGNOSIS — J189 Pneumonia, unspecified organism: Principal | ICD-10-CM | POA: Insufficient documentation

## 2023-11-29 DIAGNOSIS — Z888 Allergy status to other drugs, medicaments and biological substances status: Secondary | ICD-10-CM

## 2023-11-29 DIAGNOSIS — F329 Major depressive disorder, single episode, unspecified: Secondary | ICD-10-CM | POA: Diagnosis present

## 2023-11-29 DIAGNOSIS — Z955 Presence of coronary angioplasty implant and graft: Secondary | ICD-10-CM

## 2023-11-29 DIAGNOSIS — Z800 Family history of malignant neoplasm of digestive organs: Secondary | ICD-10-CM

## 2023-11-29 DIAGNOSIS — Z8673 Personal history of transient ischemic attack (TIA), and cerebral infarction without residual deficits: Secondary | ICD-10-CM

## 2023-11-29 DIAGNOSIS — R0602 Shortness of breath: Secondary | ICD-10-CM

## 2023-11-29 DIAGNOSIS — Z881 Allergy status to other antibiotic agents status: Secondary | ICD-10-CM

## 2023-11-29 DIAGNOSIS — Z7982 Long term (current) use of aspirin: Secondary | ICD-10-CM

## 2023-11-29 DIAGNOSIS — Z833 Family history of diabetes mellitus: Secondary | ICD-10-CM

## 2023-11-29 DIAGNOSIS — K219 Gastro-esophageal reflux disease without esophagitis: Secondary | ICD-10-CM | POA: Diagnosis present

## 2023-11-29 DIAGNOSIS — J188 Other pneumonia, unspecified organism: Secondary | ICD-10-CM | POA: Insufficient documentation

## 2023-11-29 DIAGNOSIS — A419 Sepsis, unspecified organism: Principal | ICD-10-CM | POA: Insufficient documentation

## 2023-11-29 DIAGNOSIS — Z8249 Family history of ischemic heart disease and other diseases of the circulatory system: Secondary | ICD-10-CM

## 2023-11-29 DIAGNOSIS — E860 Dehydration: Secondary | ICD-10-CM | POA: Diagnosis present

## 2023-11-29 DIAGNOSIS — R1310 Dysphagia, unspecified: Secondary | ICD-10-CM | POA: Diagnosis not present

## 2023-11-29 DIAGNOSIS — Z7902 Long term (current) use of antithrombotics/antiplatelets: Secondary | ICD-10-CM

## 2023-11-29 DIAGNOSIS — F419 Anxiety disorder, unspecified: Secondary | ICD-10-CM | POA: Diagnosis present

## 2023-11-29 DIAGNOSIS — G2581 Restless legs syndrome: Secondary | ICD-10-CM | POA: Diagnosis present

## 2023-11-29 DIAGNOSIS — R918 Other nonspecific abnormal finding of lung field: Secondary | ICD-10-CM

## 2023-11-29 DIAGNOSIS — Z9884 Bariatric surgery status: Secondary | ICD-10-CM

## 2023-11-29 DIAGNOSIS — Z87892 Personal history of anaphylaxis: Secondary | ICD-10-CM

## 2023-11-29 DIAGNOSIS — Z79890 Hormone replacement therapy (postmenopausal): Secondary | ICD-10-CM

## 2023-11-29 DIAGNOSIS — E1140 Type 2 diabetes mellitus with diabetic neuropathy, unspecified: Secondary | ICD-10-CM | POA: Diagnosis present

## 2023-11-29 DIAGNOSIS — H547 Unspecified visual loss: Secondary | ICD-10-CM | POA: Diagnosis present

## 2023-11-29 DIAGNOSIS — Z87440 Personal history of urinary (tract) infections: Secondary | ICD-10-CM

## 2023-11-29 DIAGNOSIS — Z823 Family history of stroke: Secondary | ICD-10-CM

## 2023-11-29 DIAGNOSIS — Z79899 Other long term (current) drug therapy: Secondary | ICD-10-CM

## 2023-11-29 DIAGNOSIS — Z882 Allergy status to sulfonamides status: Secondary | ICD-10-CM

## 2023-11-29 DIAGNOSIS — F429 Obsessive-compulsive disorder, unspecified: Secondary | ICD-10-CM | POA: Diagnosis present

## 2023-11-29 DIAGNOSIS — I152 Hypertension secondary to endocrine disorders: Secondary | ICD-10-CM | POA: Diagnosis present

## 2023-11-29 LAB — CBC
HCT: 32.9 % — ABNORMAL LOW (ref 36.0–46.0)
Hemoglobin: 10.5 g/dL — ABNORMAL LOW (ref 12.0–15.0)
MCH: 25.7 pg — ABNORMAL LOW (ref 26.0–34.0)
MCHC: 31.9 g/dL (ref 30.0–36.0)
MCV: 80.6 fL (ref 80.0–100.0)
Platelets: 501 10*3/uL — ABNORMAL HIGH (ref 150–400)
RBC: 4.08 MIL/uL (ref 3.87–5.11)
RDW: 15.8 % — ABNORMAL HIGH (ref 11.5–15.5)
WBC: 7 10*3/uL (ref 4.0–10.5)
nRBC: 0 % (ref 0.0–0.2)

## 2023-11-29 LAB — MAGNESIUM: Magnesium: 1.7 mg/dL (ref 1.7–2.4)

## 2023-11-29 LAB — RESP PANEL BY RT-PCR (RSV, FLU A&B, COVID)  RVPGX2
Influenza A by PCR: NEGATIVE
Influenza B by PCR: NEGATIVE
Resp Syncytial Virus by PCR: NEGATIVE
SARS Coronavirus 2 by RT PCR: NEGATIVE

## 2023-11-29 LAB — BASIC METABOLIC PANEL WITH GFR
Anion gap: 13 (ref 5–15)
BUN: <5 mg/dL — ABNORMAL LOW (ref 8–23)
CO2: 18 mmol/L — ABNORMAL LOW (ref 22–32)
Calcium: 8.3 mg/dL — ABNORMAL LOW (ref 8.9–10.3)
Chloride: 101 mmol/L (ref 98–111)
Creatinine, Ser: 0.61 mg/dL (ref 0.44–1.00)
GFR, Estimated: >60 mL/min (ref >60)
Glucose, Bld: 234 mg/dL — ABNORMAL HIGH (ref 70–99)
Potassium: 3 mmol/L — ABNORMAL LOW (ref 3.5–5.1)
Sodium: 132 mmol/L — ABNORMAL LOW (ref 135–145)

## 2023-11-29 LAB — TROPONIN I (HIGH SENSITIVITY)
Troponin I (High Sensitivity): 13 ng/L (ref <18)
Troponin I (High Sensitivity): 12 ng/L (ref <18)

## 2023-11-29 LAB — BRAIN NATRIURETIC PEPTIDE: B Natriuretic Peptide: 141.2 pg/mL — ABNORMAL HIGH (ref 0.0–100.0)

## 2023-11-29 LAB — CBG MONITORING, ED: Glucose-Capillary: 225 mg/dL — ABNORMAL HIGH (ref 70–99)

## 2023-11-29 LAB — LACTIC ACID, PLASMA
Lactic Acid, Venous: 2 mmol/L (ref 0.5–1.9)
Lactic Acid, Venous: 1.9 mmol/L (ref 0.5–1.9)

## 2023-11-29 MED ORDER — GUAIFENESIN 100 MG/5ML PO LIQD
5.0000 mL | Freq: Four times a day (QID) | ORAL | Status: DC | PRN
  Administered 2023-11-30: 5 mL via ORAL
  Filled 2023-11-29: qty 10

## 2023-11-29 MED ORDER — ENOXAPARIN SODIUM 40 MG/0.4ML IJ SOSY
40.0000 mg | PREFILLED_SYRINGE | INTRAMUSCULAR | Status: DC
  Administered 2023-11-29 – 2023-11-30 (×2): 40 mg via SUBCUTANEOUS
  Filled 2023-11-29 (×2): qty 0.4

## 2023-11-29 MED ORDER — INSULIN ASPART 100 UNIT/ML IJ SOLN
0.0000 [IU] | Freq: Three times a day (TID) | INTRAMUSCULAR | Status: DC
  Administered 2023-11-30 (×2): 2 [IU] via SUBCUTANEOUS
  Administered 2023-11-30: 5 [IU] via SUBCUTANEOUS
  Administered 2023-12-01: 2 [IU] via SUBCUTANEOUS
  Filled 2023-11-29 (×4): qty 1

## 2023-11-29 MED ORDER — INSULIN GLARGINE-YFGN 100 UNIT/ML ~~LOC~~ SOLN
10.0000 [IU] | Freq: Every day | SUBCUTANEOUS | Status: DC
  Administered 2023-11-29 – 2023-11-30 (×2): 10 [IU] via SUBCUTANEOUS
  Filled 2023-11-29 (×4): qty 0.1

## 2023-11-29 MED ORDER — FUROSEMIDE 10 MG/ML IJ SOLN
60.0000 mg | Freq: Two times a day (BID) | INTRAMUSCULAR | Status: DC
  Administered 2023-11-29 – 2023-12-01 (×3): 60 mg via INTRAVENOUS
  Filled 2023-11-29: qty 6
  Filled 2023-11-29 (×3): qty 8

## 2023-11-29 MED ORDER — CEFTRIAXONE SODIUM 2 G IJ SOLR
2.0000 g | Freq: Once | INTRAMUSCULAR | Status: AC
  Administered 2023-11-29: 2 g via INTRAVENOUS
  Filled 2023-11-29: qty 20

## 2023-11-29 MED ORDER — CLOPIDOGREL BISULFATE 75 MG PO TABS
75.0000 mg | ORAL_TABLET | Freq: Every day | ORAL | Status: DC
  Administered 2023-11-30 – 2023-12-01 (×2): 75 mg via ORAL
  Filled 2023-11-29 (×2): qty 1

## 2023-11-29 MED ORDER — METOPROLOL TARTRATE 25 MG PO TABS
25.0000 mg | ORAL_TABLET | Freq: Two times a day (BID) | ORAL | Status: DC
  Administered 2023-11-29 – 2023-12-01 (×4): 25 mg via ORAL
  Filled 2023-11-29 (×4): qty 1

## 2023-11-29 MED ORDER — SERTRALINE HCL 50 MG PO TABS
100.0000 mg | ORAL_TABLET | Freq: Every day | ORAL | Status: DC
  Administered 2023-11-30 – 2023-12-01 (×2): 100 mg via ORAL
  Filled 2023-11-29 (×2): qty 2

## 2023-11-29 MED ORDER — POTASSIUM CHLORIDE 20 MEQ PO PACK
40.0000 meq | PACK | Freq: Two times a day (BID) | ORAL | Status: DC
  Administered 2023-11-29 – 2023-11-30 (×2): 40 meq via ORAL
  Filled 2023-11-29 (×3): qty 2

## 2023-11-29 MED ORDER — ASPIRIN 81 MG PO TBEC
81.0000 mg | DELAYED_RELEASE_TABLET | Freq: Every day | ORAL | Status: DC
  Administered 2023-11-30 – 2023-12-01 (×2): 81 mg via ORAL
  Filled 2023-11-29 (×2): qty 1

## 2023-11-29 MED ORDER — PRAMIPEXOLE DIHYDROCHLORIDE 1 MG PO TABS
1.0000 mg | ORAL_TABLET | Freq: Every day | ORAL | Status: DC
  Administered 2023-11-29 – 2023-11-30 (×2): 1 mg via ORAL
  Filled 2023-11-29 (×3): qty 1

## 2023-11-29 MED ORDER — ONDANSETRON 8 MG PO TBDP: 8.0000 mg | ORAL_TABLET | Freq: Three times a day (TID) | ORAL | Status: DC | PRN

## 2023-11-29 MED ORDER — LACTATED RINGERS IV BOLUS
500.0000 mL | Freq: Once | INTRAVENOUS | Status: AC
  Administered 2023-11-29: 500 mL via INTRAVENOUS

## 2023-11-29 MED ORDER — DOXYCYCLINE HYCLATE 100 MG PO TABS
100.0000 mg | ORAL_TABLET | Freq: Two times a day (BID) | ORAL | Status: DC
  Administered 2023-11-29 – 2023-12-01 (×4): 100 mg via ORAL
  Filled 2023-11-29 (×4): qty 1

## 2023-11-29 MED ORDER — IPRATROPIUM-ALBUTEROL 0.5-2.5 (3) MG/3ML IN SOLN
3.0000 mL | Freq: Four times a day (QID) | RESPIRATORY_TRACT | Status: DC
  Administered 2023-11-29 – 2023-12-01 (×6): 3 mL via RESPIRATORY_TRACT
  Filled 2023-11-29 (×8): qty 3

## 2023-11-29 MED ORDER — SODIUM CHLORIDE 0.9 % IV SOLN
100.0000 mg | Freq: Once | INTRAVENOUS | Status: AC
  Administered 2023-11-29: 100 mg via INTRAVENOUS
  Filled 2023-11-29: qty 100

## 2023-11-29 MED ORDER — QUETIAPINE FUMARATE 300 MG PO TABS
300.0000 mg | ORAL_TABLET | Freq: Every day | ORAL | Status: DC
  Administered 2023-11-29 – 2023-11-30 (×2): 300 mg via ORAL
  Filled 2023-11-29 (×2): qty 1

## 2023-11-29 MED ORDER — TRAZODONE HCL 50 MG PO TABS
25.0000 mg | ORAL_TABLET | Freq: Every evening | ORAL | Status: DC | PRN
  Administered 2023-11-29: 25 mg via ORAL
  Filled 2023-11-29: qty 1

## 2023-11-29 MED ORDER — PANTOPRAZOLE SODIUM 40 MG PO TBEC
40.0000 mg | DELAYED_RELEASE_TABLET | Freq: Every day | ORAL | Status: DC
  Administered 2023-11-30 – 2023-12-01 (×2): 40 mg via ORAL
  Filled 2023-11-29 (×2): qty 1

## 2023-11-29 MED ORDER — ROSUVASTATIN CALCIUM 5 MG PO TABS
5.0000 mg | ORAL_TABLET | Freq: Every day | ORAL | Status: DC
  Administered 2023-11-30 – 2023-12-01 (×2): 5 mg via ORAL
  Filled 2023-11-29 (×3): qty 1

## 2023-11-29 MED ORDER — ALPRAZOLAM 0.5 MG PO TABS
0.5000 mg | ORAL_TABLET | Freq: Two times a day (BID) | ORAL | Status: DC | PRN
  Administered 2023-11-30: 0.5 mg via ORAL
  Filled 2023-11-29: qty 1

## 2023-11-29 MED ORDER — ALBUTEROL SULFATE (2.5 MG/3ML) 0.083% IN NEBU
2.5000 mg | INHALATION_SOLUTION | Freq: Four times a day (QID) | RESPIRATORY_TRACT | Status: DC | PRN
  Administered 2023-11-29 – 2023-12-01 (×2): 2.5 mg via RESPIRATORY_TRACT
  Filled 2023-11-29 (×2): qty 3

## 2023-11-29 MED ORDER — POTASSIUM CHLORIDE CRYS ER 20 MEQ PO TBCR
40.0000 meq | EXTENDED_RELEASE_TABLET | Freq: Once | ORAL | Status: AC
  Administered 2023-11-29: 40 meq via ORAL
  Filled 2023-11-29: qty 2

## 2023-11-29 MED ORDER — IPRATROPIUM-ALBUTEROL 0.5-2.5 (3) MG/3ML IN SOLN
6.0000 mL | Freq: Once | RESPIRATORY_TRACT | Status: AC
  Administered 2023-11-29: 6 mL via RESPIRATORY_TRACT
  Filled 2023-11-29: qty 3

## 2023-11-29 NOTE — ED Provider Notes (Signed)
 [facility] Provider Note    Event Date/Time   First MD Initiated Contact with Patient 11/29/23 1510     (approximate)   History   Shortness of Breath   HPI  Amy Hansen is a 68 y.o. female with history of CHF, diabetes, Crohn's disease, presenting with shortness of breath and cough.  States that she had a temperature of 100 yesterday.  States that she was discharged yesterday from the hospital where she was being treated for pneumonia, did take her antibiotics this morning.  States that she has tried the inhalers that are prescribed for her without any relief with her shortness of breath.  Feels like the shortness of breath and coughing are getting worse.  At triage patient had to stop between sentences to speak and was quite tachypneic for them as well as tachycardic.  She denies any chest pain.  On independent review, she was discharged yesterday after she was admitted for multilobar pneumonia, she was discharged with Augmentin  and azithromycin  as well as albuterol  MDI and guaifenesin .     Physical Exam   Triage Vital Signs: ED Triage Vitals  Encounter Vitals Group     BP 11/29/23 1238 136/79     Systolic BP Percentile --      Diastolic BP Percentile --      Pulse Rate 11/29/23 1238 (!) 131     Resp 11/29/23 1238 (!) 22     Temp 11/29/23 1238 98.5 F (36.9 C)     Temp src --      SpO2 11/29/23 1237 96 %     Weight 11/29/23 1239 114 lb (51.7 kg)     Height 11/29/23 1239 5' (1.524 m)     Head Circumference --      Peak Flow --      Pain Score 11/29/23 1237 5     Pain Loc --      Pain Education --      Exclude from Growth Chart --     Most recent vital signs: Vitals:   11/29/23 1551 11/29/23 1614  BP: 130/81   Pulse: (!) 105   Resp: 16   Temp:  97.6 F (36.4 C)  SpO2: 100%      General: Awake, no distress.  CV:  Good peripheral perfusion.  Resp:  Normal effort.  Tachypneic, crackles at the bases bilaterally Abd:  No distention.   Soft nontender Other:  No unilateral calf swelling or tenderness, no lower extremity edema   ED Results / Procedures / Treatments   Labs (all labs ordered are listed, but only abnormal results are displayed) Labs Reviewed  BASIC METABOLIC PANEL WITH GFR - Abnormal; Notable for the following components:      Result Value   Sodium 132 (*)    Potassium 3.0 (*)    CO2 18 (*)    Glucose, Bld 234 (*)    BUN <5 (*)    Calcium  8.3 (*)    All other components within normal limits  CBC - Abnormal; Notable for the following components:   Hemoglobin 10.5 (*)    HCT 32.9 (*)    MCH 25.7 (*)    RDW 15.8 (*)    Platelets 501 (*)    All other components within normal limits  BRAIN NATRIURETIC PEPTIDE - Abnormal; Notable for the following components:   B Natriuretic Peptide 141.2 (*)    All other components within normal limits  LACTIC ACID, PLASMA - Abnormal;  Notable for the following components:   Lactic Acid, Venous 2.0 (*)    All other components within normal limits  RESP PANEL BY RT-PCR (RSV, FLU A&B, COVID)  RVPGX2  CULTURE, BLOOD (ROUTINE X 2)  CULTURE, BLOOD (ROUTINE X 2)  MAGNESIUM   LACTIC ACID, PLASMA  TROPONIN I (HIGH SENSITIVITY)     EKG  Sinus tachycardia, rate 125, normal QS, normal QTc, no ischemic ST elevation, T wave flattening in 3, not significant change compared to prior   RADIOLOGY Chest x-ray on my independent interpretation shows bilateral opacities   PROCEDURES:  Critical Care performed: No  Procedures   MEDICATIONS ORDERED IN ED: Medications  doxycycline  (VIBRAMYCIN ) 100 mg in sodium chloride  0.9 % 250 mL IVPB (100 mg Intravenous New Bag/Given 11/29/23 1612)  lactated ringers  bolus 500 mL (500 mLs Intravenous New Bag/Given 11/29/23 1612)  cefTRIAXone  (ROCEPHIN ) 2 g in sodium chloride  0.9 % 100 mL IVPB (0 g Intravenous Stopped 11/29/23 1613)  ipratropium-albuterol  (DUONEB) 0.5-2.5 (3) MG/3ML nebulizer solution 6 mL (6 mLs Nebulization Given 11/29/23 1543)   potassium chloride  SA (KLOR-CON  M) CR tablet 40 mEq (40 mEq Oral Given 11/29/23 1530)     IMPRESSION / MDM / ASSESSMENT AND PLAN / ED COURSE  I reviewed the triage vital signs and the nursing notes.                              Differential diagnosis includes, but is not limited to, worsening pneumonia, early sepsis, viral illness, atypical ACS, consider CHF but patient does not appear volume overloaded at this time, also consider dehydration or electrolyte derangements.  Labs, EKG, troponin, chest x-ray, blood cultures, lactic acid.  Will give her some IV fluids as well as IV ceftriaxone  and doxycycline .  She will likely need to be admitted for further management.  Will also give her a nebulizer treatment.  Patient's presentation is most consistent with acute presentation with potential threat to life or bodily function.  Independent review of labs and imaging below.  Given her worsening shortness of breath, worsening right middle lobe pneumonia with persistent bilateral pneumonia, she is at high risk and will need to be admitted for further management.  Consult the hospitalist was agreeable with the plan for admission will evaluate the patient.  She is admitted.  The patient is on the cardiac monitor to evaluate for evidence of arrhythmia and/or significant heart rate changes.   Clinical Course as of 11/29/23 1639  Fri Nov 29, 2023  1513 DG Chest 2 View IMPRESSION: Bilateral pneumonia, increased in the right middle lobe and mildly improved elsewhere   [TT]  1619 Independent review of labs, respiratory viral panel is negative, troponins not elevated, magnesium  is normal, no leukocytosis, she does have mild hyponatremia, hypokalemia, lactate is 2. [TT]    Clinical Course User Index [TT] Drenda Gentle, Richard Champion, MD     FINAL CLINICAL IMPRESSION(S) / ED DIAGNOSES   Final diagnoses:  SOB (shortness of breath)  Multifocal pneumonia     Rx / DC Orders   ED Discharge Orders     None         Note:  This document was prepared using Dragon voice recognition software and may include unintentional dictation errors.     Shane Darling, MD 11/29/23 1640

## 2023-11-29 NOTE — Hospital Course (Addendum)
 69 y.o. female with history of diabetes mellitus, Crohn's disease, CKD, HFpEF, recent diagnosis of multifocal pneumonia discharged from [facility] 11/28/2023 presenting back to ED with symptoms of worsening shortness of breath.    Assessment and Plan:   Concern for sepsis - Initial tachycardia, tachypnea, lactate 2.0, give concern for sepsis.  Was given initial IV fluid bolus, blood cultures, empiric antibiotics.  Monitor blood pressure closely.  Trend lactate.   Possible acute exacerbation of chronic HFpEF - Bilateral congestion on x-ray could be interpreted as worsening pulmonary edema.  Mildly elevated BNP, noted basilar crackles bilaterally.  Noted noncompliance with cardiac regimen as well.  Currently not hypoxic.  Will resume patient's metoprolol  tartrate 25 mg twice daily.  Initiate IV Lasix  60 mg every 12 hours.  1500 mL fluid restriction.  Monitor urine output.   Multifocal pneumonia (POA) - Initiated on antibiotic coverage at prior admission, discharged 5/1 on p.o. Augmentin  plus azithromycin .  Chest x-ray personally reviewed looks very similar to previous admission.  No leukocytosis, purulent sputum.  Rapid COVID/flu/RSV negative.  Will change antibiotics to p.o. doxycycline .  Schedule DuoNeb every 6 hours, albuterol  nebulizers as needed.  No active wheezing appreciated, will hold off on IV Solu-Medrol .   Dysphagia with dehydration - Patient reported decreased p.o. intake as well as a inability to take her home medication regiment for over a week.  Admits to pills feeling stuck in her esophagus.  Suspect mild effective ketosis/dehydration.  Received IV fluid bolus.  Will order swallow eval.   Mild hypokalemia - 3.0 on presentation.  Will order p.o. potassium supplementation 40 equivalents twice daily.  Given diuresis above, may promote potassium wasting.  Will recheck BMP and magnesium  in AM.   Diabetes mellitus, uncontrolled - A1c 9.8 suggesting very poor control.  Patient admits that her  stress levels very high and her blood sugars have been running higher than normal.  Apparently only uses about 0 injection.  Will initiate on insulin  glargine 10 units at bedtime, insulin  sliding scale.  Carb consistent diet.   Anxiety - Likely contributing substantially to her symptoms.  Patient states she has not been able to take her medications.  Will restart patient's anxiety medications.   Hypertension - Metoprolol  started as noted above.  Will await volume resuscitation before restarting other home medication regimen.   GERD - Protonix  40 mg daily initiated.  May be contributing to patient's dysphagia.   Restless leg syndrome - Pramipexole  1 mg at night initiated.

## 2023-11-29 NOTE — ED Triage Notes (Addendum)
 Pt comes with increased sob. Pt was dx with double pneumonia. Pt was seen here and treated and dc home. Pt felt she may have been sent home  too soon. Pt states she is sicker and was advised to come here. Pt did have breathing treatment earlier today. Pt having to stop between sentences and speaking. Pt has labored breathing noted and accessory muscle use.

## 2023-11-29 NOTE — Assessment & Plan Note (Signed)
 Acute with worsening lung sounds on exam today and SOB with talking + unable to take deep breaths without pain.  Provided nebulizer in office to assist and advised to return to ER due to ongoing symptoms, with exam showing worsening lung fields compared to last visit.  Would benefit further testing.

## 2023-11-29 NOTE — Progress Notes (Signed)
 BP 128/82 (BP Location: Right Arm, Patient Position: Sitting, Cuff Size: Small)   Pulse (!) 128   Temp 98 F (36.7 C) (Oral)   Resp 20   Wt 111 lb (50.3 kg)   LMP  (LMP Unknown)   SpO2 (!) 89%   BMI 21.68 kg/m    Subjective:    Patient ID: STORI PARAYNO, female    DOB: 12-16-1955, 68 y.o.   MRN: [id number]  HPI: DILLYNN SCHULLO is a 68 y.o. female  Chief Complaint  Patient presents with   Pneumonia    Should not of come home.    Transition of Care Hospital Follow up.  Admitted to hospital on 11/27/23 for worsening pneumonia and went home on 11/28/23.  She reports she should not have come home, but states she was given the option to stay or go home. Initial O2 sat when came into office 89% and HR tachy. Continues to report feeling bad.  Is having shortness of breath, was given inhaler which she is using every 4 hours (Albuterol ), but is not helping much.  Sent home on Augmentin  and Azithromycin .  States she is feeling worse and it hurts to take deep breaths + feels short of breath with talking.    Hospital/Facility: [facility] D/C Physician: Dr. Macarthur Savory D/C Date: 11/28/23  Records Requested: 11/29/23 Records Received: 11/29/23 Records Reviewed: 11/29/23  Diagnoses on Discharge: Multifocal Pneumonia  Date of interactive Contact within 48 hours of discharge:  Contact was through: none  Date of 7 day or 14 day face-to-face visit:    within 7 days  Outpatient Encounter Medications as of 11/29/2023  Medication Sig Note   albuterol  (VENTOLIN  HFA) 108 (90 Base) MCG/ACT inhaler Inhale 2 puffs into the lungs every 6 (six) hours as needed for wheezing or shortness of breath.    ALPRAZolam  (XANAX ) 0.5 MG tablet Take 1 tablet (0.5 mg total) by mouth 2 (two) times daily as needed for anxiety. Must last 15 days.    amoxicillin -clavulanate (AUGMENTIN ) 875-125 MG tablet Take 1 tablet by mouth 2 (two) times daily.    aspirin  EC 81 MG tablet Take 1 tablet (81 mg) by mouth once daily. Swallow whole.     azithromycin  (ZITHROMAX ) 500 MG tablet Take 1 tablet (500 mg total) by mouth daily.    cholecalciferol  (CHOLECALCIFEROL ) 25 MCG tablet Take 1 tablet (1,000 Units total) by mouth daily.    clopidogrel  (PLAVIX ) 75 MG tablet Take 1 tablet (75 mg total) by mouth daily with breakfast.    conjugated estrogens  (PREMARIN ) vaginal cream Place vaginally.    estradiol  (ESTRACE ) 0.1 MG/GM vaginal cream Apply one pea-sized amount around the opening of the urethra 3 times weekly.    furosemide  (LASIX ) 20 MG tablet Take 1 tablet (20 mg total) by mouth daily as needed (for swelling in the legs accompanied by a weight gain of 3 pounds overnight -- USE SPARINGLY).    guaiFENesin  (ROBITUSSIN) 100 MG/5ML liquid Take 5 mLs by mouth every 6 (six) hours as needed for to loosen phlegm.    metoprolol  tartrate (LOPRESSOR ) 25 MG tablet Take by mouth.    nitroGLYCERIN  (NITROSTAT ) 0.4 MG SL tablet Place 1 tablet (0.4 mg total) under the tongue every 5 (five) minutes as needed. 11/27/2023: PRN   ondansetron  (ZOFRAN -ODT) 8 MG disintegrating tablet Take 1 tablet (8 mg total) by mouth every 8 (eight) hours as needed for nausea or vomiting. 11/27/2023: PRN   pantoprazole  (PROTONIX ) 40 MG tablet Take 1 tablet (40 mg total)  by mouth daily.    pramipexole  (MIRAPEX ) 1 MG tablet Take 1 tablet (1 mg total) by mouth at bedtime.    pregabalin  (LYRICA ) 75 MG capsule 1 po at bedtime for 1 week, then increase to 1 po bid.    QUEtiapine  (SEROQUEL ) 300 MG tablet Take 1 tablet (300 mg total) by mouth at bedtime.    rOPINIRole  (REQUIP ) 1 MG tablet Take 1 tablet (1 mg total) by mouth 2 (two) times daily.    rosuvastatin  (CRESTOR ) 5 MG tablet TAKE 1 TABLET DAILY    sertraline  (ZOLOFT ) 100 MG tablet Take 1 tablet (100 mg total) by mouth daily.    tirzepatide  (MOUNJARO ) 5 MG/0.5ML Pen Inject 5 mg into the skin once a week.    ursodiol (ACTIGALL) 300 MG capsule Take 2 capsules in the AM and 1 capsule in the PM    isosorbide  mononitrate (IMDUR ) 30 MG 24  hr tablet Take 0.5 tablets (15 mg total) by mouth daily.    potassium chloride  SA (KLOR-CON  M20) 20 MEQ tablet Take 1 tablet (20 mEq total) by mouth 2 (two) times daily. 09/17/2023: Patient having trouble swallowing    No facility-administered encounter medications on file as of 11/29/2023.   Diagnostic Tests Reviewed/Disposition:     Latest Ref Rng & Units 11/28/2023    5:31 AM 11/27/2023   11:13 AM 11/26/2023   10:11 AM  CBC  WBC 4.0 - 10.5 K/uL 4.9  5.1  4.4   Hemoglobin 12.0 - 15.0 g/dL 11.9  14.7  82.9   Hematocrit 36.0 - 46.0 % 33.1  35.3  35.1   Platelets 150 - 400 K/uL 395  376  301     CMP     Component Value Date/Time   NA 137 11/28/2023 0531   NA 134 11/26/2023 1011   NA 140 01/10/2012 0717   K 2.6 (LL) 11/28/2023 0531   K 4.1 01/10/2012 0717   CL 110 11/28/2023 0531   CL 105 01/10/2012 0717   CO2 22 11/28/2023 0531   CO2 28 01/10/2012 0717   GLUCOSE 109 (H) 11/28/2023 0531   GLUCOSE 68 01/10/2012 0717   BUN <5 (L) 11/28/2023 0531   BUN 6 (L) 11/26/2023 1011   BUN 4 (L) 01/10/2012 0717   CREATININE 0.54 11/28/2023 0531   CREATININE 0.58 (L) 01/10/2012 0717   CALCIUM  7.7 (L) 11/28/2023 0531   CALCIUM  8.8 01/10/2012 0717   PROT 6.6 10/31/2023 1441   ALBUMIN 4.0 10/31/2023 1441   AST 30 10/31/2023 1441   ALT 26 10/31/2023 1441   ALKPHOS 251 (H) 10/31/2023 1441   BILITOT 0.4 10/31/2023 1441   GFR 88.73 03/30/2021 1205   EGFR 91 11/26/2023 1011   GFRNONAA >60 11/28/2023 0531   GFRNONAA >60 01/10/2012 0717    Consults: none  Discharge Instructions: Follow-up with PCP  Disease/illness Education: Educated patient at length today on pneumonia and provider concerns at present  Home Health/Community Services Discussions/Referrals: none  Establishment or re-establishment of referral orders for community resources: none  Discussion with other health care providers: Reviewed all recent notes  Assessment and Support of treatment regimen adherence: Reviewed with  patient at length and she agrees with return to hospital  Appointments Coordinated with: patient  Education for self-management, independent living, and ADLs:  Reviewed with patient  Relevant past medical, surgical, family and social history reviewed and updated as indicated. Interim medical history since our last visit reviewed. Allergies and medications reviewed and updated.  Review of Systems  Constitutional:  Positive for activity change, appetite change, fatigue and fever. Negative for diaphoresis.  HENT:  Positive for congestion, sinus pressure and voice change.   Respiratory:  Positive for cough, chest tightness, shortness of breath and wheezing.   Cardiovascular:  Positive for chest pain (with breathing) and palpitations (at one point today). Negative for leg swelling.  Gastrointestinal: Negative.   Neurological:  Positive for dizziness and weakness. Negative for syncope, speech difficulty, light-headedness and headaches.  Psychiatric/Behavioral: Negative.      Per HPI unless specifically indicated above     Objective:    BP 128/82 (BP Location: Right Arm, Patient Position: Sitting, Cuff Size: Small)   Pulse (!) 128   Temp 98 F (36.7 C) (Oral)   Resp 20   Wt 111 lb (50.3 kg)   LMP  (LMP Unknown)   SpO2 (!) 89%   BMI 21.68 kg/m   Wt Readings from Last 3 Encounters:  11/29/23 111 lb (50.3 kg)  11/26/23 110 lb 12.8 oz (50.3 kg)  11/05/23 116 lb (52.6 kg)    Physical Exam Vitals and nursing note reviewed.  Constitutional:      General: She is awake. She is not in acute distress.    Appearance: She is well-developed and well-groomed. She is obese. She is ill-appearing. She is not toxic-appearing.     Comments: Hoarseness present.  HENT:     Head: Normocephalic.     Right Ear: Hearing, ear canal and external ear normal. A middle ear effusion is present. Tympanic membrane is not injected or perforated.     Left Ear: Hearing, ear canal and external ear normal. A  middle ear effusion is present. Tympanic membrane is not injected or perforated.     Nose: Rhinorrhea present. Rhinorrhea is clear.     Right Sinus: No maxillary sinus tenderness or frontal sinus tenderness.     Left Sinus: No maxillary sinus tenderness or frontal sinus tenderness.     Mouth/Throat:     Mouth: Mucous membranes are moist.     Pharynx: Posterior oropharyngeal erythema (mild) present. No pharyngeal swelling or oropharyngeal exudate.  Eyes:     General: Lids are normal.        Right eye: No discharge.        Left eye: No discharge.     Conjunctiva/sclera: Conjunctivae normal.     Pupils: Pupils are equal, round, and reactive to light.  Neck:     Thyroid : No thyromegaly.     Vascular: No carotid bruit.  Cardiovascular:     Rate and Rhythm: Regular rhythm. Tachycardia present.     Heart sounds: Normal heart sounds. No murmur heard.    No gallop.  Pulmonary:     Effort: Accessory muscle usage present. No respiratory distress.     Breath sounds: Decreased air movement present. Wheezing and rales present. No decreased breath sounds or rhonchi.     Comments: Coarse rales bilateral lower lobes and decreased air entry throughout. Discomfort noted when attempting to take deep breath. SOB with talking and walking noted. Frequent coarse cough present and hoarseness. Abdominal:     General: Bowel sounds are normal.     Palpations: Abdomen is soft. There is no hepatomegaly or splenomegaly.  Musculoskeletal:     Cervical back: Normal range of motion and neck supple.     Right lower leg: No edema.     Left lower leg: No edema.  Lymphadenopathy:     Head:     Right side of  head: Submandibular adenopathy present. No submental, tonsillar, preauricular or posterior auricular adenopathy.     Left side of head: Submandibular adenopathy present. No submental, tonsillar, preauricular or posterior auricular adenopathy.     Cervical: No cervical adenopathy.  Skin:    General: Skin is warm  and dry.  Neurological:     Mental Status: She is alert and oriented to person, place, and time.  Psychiatric:        Attention and Perception: Attention normal.        Mood and Affect: Mood normal.        Speech: Speech normal.        Behavior: Behavior normal. Behavior is cooperative.        Thought Content: Thought content normal.    Results for orders placed or performed during the hospital encounter of 11/27/23  Basic metabolic panel   Collection Time: 11/27/23 11:13 AM  Result Value Ref Range   Sodium 131 (L) 135 - 145 mmol/L   Potassium 3.1 (L) 3.5 - 5.1 mmol/L   Chloride 106 98 - 111 mmol/L   CO2 20 (L) 22 - 32 mmol/L   Glucose, Bld 205 (H) 70 - 99 mg/dL   BUN 7 (L) 8 - 23 mg/dL   Creatinine, Ser 4.09 0.44 - 1.00 mg/dL   Calcium  8.1 (L) 8.9 - 10.3 mg/dL   GFR, Estimated >81 >19 mL/min   Anion gap 5 5 - 15  CBC   Collection Time: 11/27/23 11:13 AM  Result Value Ref Range   WBC 5.1 4.0 - 10.5 K/uL   RBC 4.44 3.87 - 5.11 MIL/uL   Hemoglobin 11.4 (L) 12.0 - 15.0 g/dL   HCT 14.7 (L) 82.9 - 56.2 %   MCV 79.5 (L) 80.0 - 100.0 fL   MCH 25.7 (L) 26.0 - 34.0 pg   MCHC 32.3 30.0 - 36.0 g/dL   RDW 13.0 (H) 86.5 - 78.4 %   Platelets 376 150 - 400 K/uL   nRBC 0.0 0.0 - 0.2 %  Troponin I (High Sensitivity)   Collection Time: 11/27/23 11:13 AM  Result Value Ref Range   Troponin I (High Sensitivity) 7 <18 ng/L  Troponin I (High Sensitivity)   Collection Time: 11/27/23  1:08 PM  Result Value Ref Range   Troponin I (High Sensitivity) 6 <18 ng/L  Blood Culture (routine x 2)   Collection Time: 11/27/23  2:07 PM   Specimen: BLOOD  Result Value Ref Range   Specimen Description BLOOD RIGHT ANTECUBITAL    Special Requests      BOTTLES DRAWN AEROBIC AND ANAEROBIC Blood Culture results may not be optimal due to an inadequate volume of blood received in culture bottles   Culture      NO GROWTH 2 DAYS Performed at [facility], [address] [place], [place] [address]     Report Status PENDING   Resp panel by RT-PCR (RSV, Flu A&B, Covid) Anterior Nasal Swab   Collection Time: 11/27/23  2:08 PM   Specimen: Anterior Nasal Swab  Result Value Ref Range   SARS Coronavirus 2 by RT PCR NEGATIVE NEGATIVE   Influenza A by PCR NEGATIVE NEGATIVE   Influenza B by PCR NEGATIVE NEGATIVE   Resp Syncytial Virus by PCR NEGATIVE NEGATIVE  Lactic acid, plasma   Collection Time: 11/27/23  2:08 PM  Result Value Ref Range   Lactic Acid, Venous 1.7 0.5 - 1.9 mmol/L  Blood Culture (routine x 2)   Collection Time: 11/27/23  2:10 PM   Specimen: BLOOD  Result Value Ref Range   Specimen Description BLOOD BLOOD RIGHT FOREARM    Special Requests      BOTTLES DRAWN AEROBIC AND ANAEROBIC Blood Culture results may not be optimal due to an inadequate volume of blood received in culture bottles   Culture      NO GROWTH 2 DAYS Performed at [facility], [address] [place], [place] [address]    Report Status PENDING   Lactic acid, plasma   Collection Time: 11/27/23  4:42 PM  Result Value Ref Range   Lactic Acid, Venous 1.7 0.5 - 1.9 mmol/L  Glucose, capillary   Collection Time: 11/27/23  4:47 PM  Result Value Ref Range   Glucose-Capillary 163 (H) 70 - 99 mg/dL  Glucose, capillary   Collection Time: 11/27/23  8:23 PM  Result Value Ref Range   Glucose-Capillary 133 (H) 70 - 99 mg/dL  Basic metabolic panel   Collection Time: 11/28/23  5:31 AM  Result Value Ref Range   Sodium 137 135 - 145 mmol/L   Potassium 2.6 (LL) 3.5 - 5.1 mmol/L   Chloride 110 98 - 111 mmol/L   CO2 22 22 - 32 mmol/L   Glucose, Bld 109 (H) 70 - 99 mg/dL   BUN <5 (L) 8 - 23 mg/dL   Creatinine, Ser 0.25 0.44 - 1.00 mg/dL   Calcium  7.7 (L) 8.9 - 10.3 mg/dL   GFR, Estimated >42 >70 mL/min   Anion gap 5 5 - 15  CBC   Collection Time: 11/28/23  5:31 AM  Result Value Ref Range   WBC 4.9 4.0 - 10.5 K/uL   RBC 4.16 3.87 - 5.11 MIL/uL   Hemoglobin 11.0 (L) 12.0 - 15.0 g/dL   HCT 62.3 (L)  76.2 - 46.0 %   MCV 79.6 (L) 80.0 - 100.0 fL   MCH 26.4 26.0 - 34.0 pg   MCHC 33.2 30.0 - 36.0 g/dL   RDW 83.1 (H) 51.7 - 61.6 %   Platelets 395 150 - 400 K/uL   nRBC 0.0 0.0 - 0.2 %  Hemoglobin A1c   Collection Time: 11/28/23  5:31 AM  Result Value Ref Range   Hgb A1c MFr Bld 9.8 (H) 4.8 - 5.6 %   Mean Plasma Glucose 234.56 mg/dL  Magnesium    Collection Time: 11/28/23  5:31 AM  Result Value Ref Range   Magnesium  2.1 1.7 - 2.4 mg/dL  Glucose, capillary   Collection Time: 11/28/23  8:10 AM  Result Value Ref Range   Glucose-Capillary 111 (H) 70 - 99 mg/dL  Glucose, capillary   Collection Time: 11/28/23 11:54 AM  Result Value Ref Range   Glucose-Capillary 137 (H) 70 - 99 mg/dL   *Note: Due to a large number of results and/or encounters for the requested time period, some results have not been displayed. A complete set of results can be found in Results Review.      Assessment & Plan:   Problem List Items Addressed This Visit       Respiratory   Multilobar lung infiltrate - Primary   Acute with worsening lung sounds on exam today and SOB with talking + unable to take deep breaths without pain.  Provided nebulizer in office to assist and advised to return to ER due to ongoing symptoms, with exam showing worsening lung fields compared to last visit.  Would benefit further testing.        Follow up plan: Return for return  after ER or hospitalization.

## 2023-11-29 NOTE — H&P (Signed)
 History and Physical    Patient: Amy Hansen:[id number] DOB: June 19, 1956 DOA: 11/29/2023 DOS: the patient was seen and examined on 11/29/2023 PCP: Aileen Alexanders, NP  Patient coming from: Home  Chief Complaint:  Chief Complaint  Patient presents with   Shortness of Breath    HPI: Amy Hansen is a 68 y.o. female with history of diabetes mellitus, Crohn's disease, CKD, HFpEF, recent diagnosis of multifocal pneumonia discharged from [facility] 11/28/2023 presenting back to ED with symptoms of worsening shortness of breath.  Patient was feeling well prior to discharge yesterday however on returning home felt that her cough and shortness of breath became progressively worse.  Was able to pick up her antibiotics and inhaler and use them as prescribed without any improvement.  Denies any purulent sputum, fever, chest pain, nausea, vomiting, abdominal pain.  Does admit that she has had trouble swallowing and has not taken any of her other medications for the past week including her cardiac medications and Lasix .  States that when she tries to swallow pills they get stuck and she cannot get them down.  Upon evaluation emergency department, patient initially tachycardic, tachypneic, however O2 sats 96% on room air.  Labs remarkable for WBC 7.0, potassium 3.0, bicarb 18, glucose 234, lactate 2.0.  COVID/flu/RSV rapid swab negative.  Review of Systems: As mentioned in the history of present illness. All other systems reviewed and are negative. Past Medical History:  Diagnosis Date   Acute on chronic heart failure with preserved ejection fraction (HFpEF) (HCC) 05/16/2018   AKI (acute kidney injury) (HCC) 02/05/2023   Anemia    Anxiety    Arthritis    Blood transfusion without reported diagnosis    Cataract    CHF (congestive heart failure) (HCC)    Chronic kidney disease    UTI, hematuria in urine   Colitis    Crohn's disease (HCC)    Depression    Diabetes (HCC)    Diverticulosis    Frequent  headaches    Interstitial cystitis    Recurrent UTI    Restless leg syndrome    TIA (transient ischemic attack) 02/20/2021   Urinary frequency    Past Surgical History:  Procedure Laterality Date   bariatric bypass  2012   BIOPSY  05/03/2020   Procedure: BIOPSY;  Surgeon: Janel Medford, MD;  Location: [facility] ENDOSCOPY;  Service: Endoscopy;;   CARPAL TUNNEL RELEASE Right 2003   CARPAL TUNNEL RELEASE Right    2008   CHOLECYSTECTOMY  1975   COLONOSCOPY     COLONOSCOPY WITH PROPOFOL  N/A 05/03/2020   Procedure: COLONOSCOPY WITH PROPOFOL ;  Surgeon: Janel Medford, MD;  Location: [facility] ENDOSCOPY;  Service: Endoscopy;  Laterality: N/A;   CORONARY STENT INTERVENTION N/A 05/15/2023   Procedure: CORONARY STENT INTERVENTION;  Surgeon: Sammy Crisp, MD;  Location: [facility] INVASIVE CV LAB;  Service: Cardiovascular;  Laterality: N/A;   CYSTOSCOPY W/ RETROGRADES Bilateral 06/06/2015   Procedure: CYSTOSCOPY WITH RETROGRADE PYELOGRAM;  Surgeon: Christina Coyer, MD;  Location: [facility] ORS;  Service: Urology;  Laterality: Bilateral;   EYE SURGERY Left 07/06/2022   FL INJ LEFT KNEE CT ARTHROGRAM ([facility] HX) Left    1995   GASTRIC BYPASS  2010   HAND SURGERY Left 01/19/2021   Thumb   HEMORRHOID SURGERY  2013   KNEE ARTHROSCOPY Left 1996   LEFT HEART CATH AND CORONARY ANGIOGRAPHY Left 05/15/2023   Procedure: LEFT HEART CATH AND CORONARY ANGIOGRAPHY;  Surgeon: Sammy Crisp, MD;  Location: [facility] INVASIVE  CV LAB;  Service: Cardiovascular;  Laterality: Left;   TONSILLECTOMY     TOTAL ABDOMINAL HYSTERECTOMY W/ BILATERAL SALPINGOOPHORECTOMY     Social History:  reports that she quit smoking about 48 years ago. Her smoking use included cigarettes. She has never used smokeless tobacco. She reports that she does not drink alcohol and does not use drugs.  Allergies  Allergen Reactions   Avelox [Moxifloxacin Hcl In Nacl] Anaphylaxis   Bactrim  [Sulfamethoxazole -Trimethoprim ] Anaphylaxis   Ciprofloxacin  Other  (See Comments)    Pt states she was told never to take this as it is in the same family as Avelox.    Buspar  [Buspirone ] Other (See Comments)    hallucinations   Neomycin -Polymyxin-Dexameth Other (See Comments)    Burning in the eyes   Aquaphor [Lanolin-Petrolatum] Itching and Rash   Depakote [Divalproex Sodium] Other (See Comments)    Unknown Reaction   Imitrex  [Sumatriptan ] Other (See Comments)    Neck and shoulder pain   Stadol [Butorphanol] Rash    Family History  Problem Relation Age of Onset   Colon cancer Mother    Stroke Father    Heart disease Father    Heart failure Sister    Diabetes Paternal Grandmother    Bladder Cancer Neg Hx    Kidney disease Neg Hx    Prostate cancer Neg Hx    Kidney cancer Neg Hx    Pancreatic cancer Neg Hx    Esophageal cancer Neg Hx    Stomach cancer Neg Hx    Rectal cancer Neg Hx    Breast cancer Neg Hx     Prior to Admission medications   Medication Sig Start Date End Date Taking? Authorizing Provider  albuterol  (VENTOLIN  HFA) 108 (90 Base) MCG/ACT inhaler Inhale 2 puffs into the lungs every 6 (six) hours as needed for wheezing or shortness of breath. 11/28/23   Jodeane Mulligan, DO  ALPRAZolam  (XANAX ) 0.5 MG tablet Take 1 tablet (0.5 mg total) by mouth 2 (two) times daily as needed for anxiety. Must last 15 days. 10/04/23   Verneda Golder, PA-C  amoxicillin -clavulanate (AUGMENTIN ) 875-125 MG tablet Take 1 tablet by mouth 2 (two) times daily. 11/28/23   Jodeane Mulligan, DO  aspirin  EC 81 MG tablet Take 1 tablet (81 mg) by mouth once daily. Swallow whole.    [provider]  azithromycin  (ZITHROMAX ) 500 MG tablet Take 1 tablet (500 mg total) by mouth daily. 11/28/23   Jodeane Mulligan, DO  cholecalciferol  (CHOLECALCIFEROL ) 25 MCG tablet Take 1 tablet (1,000 Units total) by mouth daily. 06/30/23   Patel, Sona, MD  clopidogrel  (PLAVIX ) 75 MG tablet Take 1 tablet (75 mg total) by mouth daily with breakfast. 09/20/23   Gollan, Timothy J, MD   conjugated estrogens  (PREMARIN ) vaginal cream Place vaginally.    [provider]  estradiol  (ESTRACE ) 0.1 MG/GM vaginal cream Apply one pea-sized amount around the opening of the urethra 3 times weekly. 08/06/23   Vaillancourt, Samantha, PA-C  furosemide  (LASIX ) 20 MG tablet Take 1 tablet (20 mg total) by mouth daily as needed (for swelling in the legs accompanied by a weight gain of 3 pounds overnight -- USE SPARINGLY). 10/17/23 01/15/24  Roark Chick, PA-C  guaiFENesin  (ROBITUSSIN) 100 MG/5ML liquid Take 5 mLs by mouth every 6 (six) hours as needed for to loosen phlegm. 11/28/23   Jodeane Mulligan, DO  isosorbide  mononitrate (IMDUR ) 30 MG 24 hr tablet Take 0.5 tablets (15 mg total) by mouth daily. 06/04/23  11/27/23  Roark Chick, PA-C  metoprolol  tartrate (LOPRESSOR ) 25 MG tablet Take by mouth.    [provider]  nitroGLYCERIN  (NITROSTAT ) 0.4 MG SL tablet Place 1 tablet (0.4 mg total) under the tongue every 5 (five) minutes as needed. 05/13/23   End, Veryl Gottron, MD  ondansetron  (ZOFRAN -ODT) 8 MG disintegrating tablet Take 1 tablet (8 mg total) by mouth every 8 (eight) hours as needed for nausea or vomiting. 06/18/23   Aileen Alexanders, NP  pantoprazole  (PROTONIX ) 40 MG tablet Take 1 tablet (40 mg total) by mouth daily. 06/20/23   End, Veryl Gottron, MD  potassium chloride  SA (KLOR-CON  M20) 20 MEQ tablet Take 1 tablet (20 mEq total) by mouth 2 (two) times daily. 06/19/23 11/27/23  End, Veryl Gottron, MD  pramipexole  (MIRAPEX ) 1 MG tablet Take 1 tablet (1 mg total) by mouth at bedtime. 10/24/23   Aileen Alexanders, NP  pregabalin  (LYRICA ) 75 MG capsule 1 po at bedtime for 1 week, then increase to 1 po bid. 08/28/23   Marvia Slocumb T, PA-C  QUEtiapine  (SEROQUEL ) 300 MG tablet Take 1 tablet (300 mg total) by mouth at bedtime. 10/04/23   Marvia Slocumb T, PA-C  rOPINIRole  (REQUIP ) 1 MG tablet Take 1 tablet (1 mg total) by mouth 2 (two) times daily. 10/23/23   Aileen Alexanders, NP  rosuvastatin   (CRESTOR ) 5 MG tablet TAKE 1 TABLET DAILY 09/02/23   Aileen Alexanders, NP  sertraline  (ZOLOFT ) 100 MG tablet Take 1 tablet (100 mg total) by mouth daily. 10/04/23   Marvia Slocumb T, PA-C  tirzepatide  (MOUNJARO ) 5 MG/0.5ML Pen Inject 5 mg into the skin once a week. 07/02/23   Hadassah Letters, MD  ursodiol (ACTIGALL) 300 MG capsule Take 2 capsules in the AM and 1 capsule in the PM    [provider]    Physical Exam:  Vitals:   11/29/23 1551 11/29/23 1600 11/29/23 1614 11/29/23 1630  BP: 130/81 132/80  127/71  Pulse: (!) 105 (!) 116  (!) 121  Resp: 16   (!) 22  Temp:   97.6 F (36.4 C)   TempSrc:   Oral   SpO2: 100% 99%  92%  Weight:      Height:        GENERAL:  Alert, pleasant, anxious HEENT:  EOMI CARDIOVASCULAR: Regular and tachycardic RESPIRATORY: Dry cough, basilar crackles bilaterally, no wheezing appreciated GASTROINTESTINAL:  Soft, nontender, nondistended EXTREMITIES:  No LE edema bilaterally NEURO:  No new focal deficits appreciated SKIN:  No rashes noted PSYCH:  Appropriate mood and affect, anxious   Data Reviewed:  Chest x-ray personally reviewed looks very similar to previous admission, bilateral patchy opacities slightly worse on the right but otherwise improved.  Assessment and Plan:  Concern for sepsis - Initial tachycardia, tachypnea, lactate 2.0, give concern for sepsis.  Was given initial IV fluid bolus, blood cultures, empiric antibiotics.  Monitor blood pressure closely.  Trend lactate.  Possible acute exacerbation of chronic HFpEF - Bilateral congestion on x-ray could be interpreted as worsening pulmonary edema.  Mildly elevated BNP, noted basilar crackles bilaterally.  Noted noncompliance with cardiac regimen as well.  Currently not hypoxic.  Will resume patient's metoprolol  tartrate 25 mg twice daily.  Initiate IV Lasix  60 mg every 12 hours.  1500 mL fluid restriction.  Monitor urine output.  Multifocal pneumonia (POA) - Initiated on antibiotic  coverage at prior admission, discharged 5/1 on p.o. Augmentin  plus azithromycin .  Chest x-ray personally reviewed looks very similar to previous admission.  No leukocytosis, purulent  sputum.  Rapid COVID/flu/RSV negative.  Will change antibiotics to p.o. doxycycline .  Schedule DuoNeb every 6 hours, albuterol  nebulizers as needed.  No active wheezing appreciated, will hold off on IV Solu-Medrol .  Dysphagia with dehydration - Patient reported decreased p.o. intake as well as a inability to take her home medication regiment for over a week.  Admits to pills feeling stuck in her esophagus.  Suspect mild effective ketosis/dehydration.  Received IV fluid bolus.  Will order swallow eval.  Mild hypokalemia - 3.0 on presentation.  Will order p.o. potassium supplementation 40 equivalents twice daily.  Given diuresis above, may promote potassium wasting.  Will recheck BMP and magnesium  in AM.  Diabetes mellitus, uncontrolled - A1c 9.8 suggesting very poor control.  Patient admits that her stress levels very high and her blood sugars have been running higher than normal.  Apparently only uses about 0 injection.  Will initiate on insulin  glargine 10 units at bedtime, insulin  sliding scale.  Carb consistent diet.  Anxiety - Likely contributing substantially to her symptoms.  Patient states she has not been able to take her medications.  Will restart patient's anxiety medications.  Hypertension - Metoprolol  started as noted above.  Will await volume resuscitation before restarting other home medication regimen.  GERD - Protonix  40 mg daily initiated.  May be contributing to patient's dysphagia.  Restless leg syndrome - Pramipexole  1 mg at night initiated.   Advance Care Planning:   Code Status: Full Code   Consults: None  Family Communication: None  Severity of Illness: The appropriate patient status for this patient is OBSERVATION. Observation status is judged to be reasonable and necessary in order  to provide the required intensity of service to ensure the patient's safety. The patient's presenting symptoms, physical exam findings, and initial radiographic and laboratory data in the context of their medical condition is felt to place them at decreased risk for further clinical deterioration. Furthermore, it is anticipated that the patient will be medically stable for discharge from the hospital within 2 midnights of admission.   Author: Jodeane Mulligan, DO 11/29/2023 5:19 PM  For on call review [email or web address].

## 2023-11-29 NOTE — Patient Instructions (Signed)

## 2023-11-29 NOTE — ED Notes (Signed)
 Pt is tachy, tachypneic and states that she is sob, applied 2L [place] at this time.

## 2023-11-29 NOTE — ED Notes (Signed)
 CCMD called to initiate cardiac monitoring

## 2023-11-29 NOTE — ED Notes (Signed)
 Pt Aox4 feeling short of breath with current saturation at 88 on room air. Placed on 2l nasal canula, saturation up to 98%. MD made aware

## 2023-11-30 DIAGNOSIS — F419 Anxiety disorder, unspecified: Secondary | ICD-10-CM | POA: Diagnosis present

## 2023-11-30 DIAGNOSIS — Z79890 Hormone replacement therapy (postmenopausal): Secondary | ICD-10-CM | POA: Diagnosis not present

## 2023-11-30 DIAGNOSIS — J9601 Acute respiratory failure with hypoxia: Secondary | ICD-10-CM | POA: Diagnosis present

## 2023-11-30 DIAGNOSIS — N12 Tubulo-interstitial nephritis, not specified as acute or chronic: Secondary | ICD-10-CM | POA: Diagnosis present

## 2023-11-30 DIAGNOSIS — G2581 Restless legs syndrome: Secondary | ICD-10-CM | POA: Diagnosis present

## 2023-11-30 DIAGNOSIS — E1159 Type 2 diabetes mellitus with other circulatory complications: Secondary | ICD-10-CM | POA: Diagnosis present

## 2023-11-30 DIAGNOSIS — E860 Dehydration: Secondary | ICD-10-CM | POA: Diagnosis present

## 2023-11-30 DIAGNOSIS — R0602 Shortness of breath: Secondary | ICD-10-CM | POA: Diagnosis present

## 2023-11-30 DIAGNOSIS — E876 Hypokalemia: Secondary | ICD-10-CM | POA: Diagnosis present

## 2023-11-30 DIAGNOSIS — I5033 Acute on chronic diastolic (congestive) heart failure: Secondary | ICD-10-CM | POA: Diagnosis present

## 2023-11-30 DIAGNOSIS — I152 Hypertension secondary to endocrine disorders: Secondary | ICD-10-CM | POA: Diagnosis present

## 2023-11-30 DIAGNOSIS — H547 Unspecified visual loss: Secondary | ICD-10-CM | POA: Diagnosis present

## 2023-11-30 DIAGNOSIS — R0781 Pleurodynia: Secondary | ICD-10-CM

## 2023-11-30 DIAGNOSIS — E1165 Type 2 diabetes mellitus with hyperglycemia: Secondary | ICD-10-CM | POA: Diagnosis present

## 2023-11-30 DIAGNOSIS — K5090 Crohn's disease, unspecified, without complications: Secondary | ICD-10-CM | POA: Diagnosis present

## 2023-11-30 DIAGNOSIS — I110 Hypertensive heart disease with heart failure: Secondary | ICD-10-CM | POA: Diagnosis present

## 2023-11-30 DIAGNOSIS — Z7902 Long term (current) use of antithrombotics/antiplatelets: Secondary | ICD-10-CM | POA: Diagnosis not present

## 2023-11-30 DIAGNOSIS — Z7985 Long-term (current) use of injectable non-insulin antidiabetic drugs: Secondary | ICD-10-CM | POA: Diagnosis not present

## 2023-11-30 DIAGNOSIS — A419 Sepsis, unspecified organism: Secondary | ICD-10-CM | POA: Diagnosis present

## 2023-11-30 DIAGNOSIS — K219 Gastro-esophageal reflux disease without esophagitis: Secondary | ICD-10-CM | POA: Diagnosis present

## 2023-11-30 DIAGNOSIS — F329 Major depressive disorder, single episode, unspecified: Secondary | ICD-10-CM | POA: Diagnosis present

## 2023-11-30 DIAGNOSIS — J189 Pneumonia, unspecified organism: Secondary | ICD-10-CM | POA: Diagnosis present

## 2023-11-30 DIAGNOSIS — R1310 Dysphagia, unspecified: Secondary | ICD-10-CM | POA: Diagnosis present

## 2023-11-30 DIAGNOSIS — E1140 Type 2 diabetes mellitus with diabetic neuropathy, unspecified: Secondary | ICD-10-CM | POA: Diagnosis present

## 2023-11-30 DIAGNOSIS — Z7982 Long term (current) use of aspirin: Secondary | ICD-10-CM | POA: Diagnosis not present

## 2023-11-30 DIAGNOSIS — Z1152 Encounter for screening for COVID-19: Secondary | ICD-10-CM | POA: Diagnosis not present

## 2023-11-30 LAB — CBC
HCT: 27.7 % — ABNORMAL LOW (ref 36.0–46.0)
Hemoglobin: 9.1 g/dL — ABNORMAL LOW (ref 12.0–15.0)
MCH: 26.3 pg (ref 26.0–34.0)
MCHC: 32.9 g/dL (ref 30.0–36.0)
MCV: 80.1 fL (ref 80.0–100.0)
Platelets: 484 10*3/uL — ABNORMAL HIGH (ref 150–400)
RBC: 3.46 MIL/uL — ABNORMAL LOW (ref 3.87–5.11)
RDW: 15.7 % — ABNORMAL HIGH (ref 11.5–15.5)
WBC: 4.6 10*3/uL (ref 4.0–10.5)
nRBC: 0 % (ref 0.0–0.2)

## 2023-11-30 LAB — CBG MONITORING, ED
Glucose-Capillary: 139 mg/dL — ABNORMAL HIGH (ref 70–99)
Glucose-Capillary: 122 mg/dL — ABNORMAL HIGH (ref 70–99)
Glucose-Capillary: 227 mg/dL — ABNORMAL HIGH (ref 70–99)

## 2023-11-30 LAB — BASIC METABOLIC PANEL WITH GFR
Anion gap: 10 (ref 5–15)
BUN: <5 mg/dL — ABNORMAL LOW (ref 8–23)
CO2: 24 mmol/L (ref 22–32)
Calcium: 8 mg/dL — ABNORMAL LOW (ref 8.9–10.3)
Chloride: 101 mmol/L (ref 98–111)
Creatinine, Ser: 0.53 mg/dL (ref 0.44–1.00)
GFR, Estimated: >60 mL/min (ref >60)
Glucose, Bld: 112 mg/dL — ABNORMAL HIGH (ref 70–99)
Potassium: 3.5 mmol/L (ref 3.5–5.1)
Sodium: 135 mmol/L (ref 135–145)

## 2023-11-30 LAB — GLUCOSE, CAPILLARY: Glucose-Capillary: 96 mg/dL (ref 70–99)

## 2023-11-30 MED ORDER — ACETAMINOPHEN 325 MG PO TABS
650.0000 mg | ORAL_TABLET | Freq: Four times a day (QID) | ORAL | Status: DC | PRN
  Administered 2023-11-30: 650 mg via ORAL
  Filled 2023-11-30: qty 2

## 2023-11-30 MED ORDER — KETOROLAC TROMETHAMINE 15 MG/ML IJ SOLN
15.0000 mg | Freq: Four times a day (QID) | INTRAMUSCULAR | Status: AC
  Administered 2023-11-30 – 2023-12-01 (×4): 15 mg via INTRAVENOUS
  Filled 2023-11-30 (×4): qty 1

## 2023-11-30 NOTE — ED Notes (Signed)
 Pt now c/o left sided flank pain. Pt states she thinks she pulled a muscle a few days ago coughing and the pain has been coming and going. Dr. Boston Byers notified to see if something can be given for pain.

## 2023-11-30 NOTE — Progress Notes (Addendum)
 Progress Note   Patient: Amy Hansen ZOX:[id number] DOB: [birth year] DOA: 11/29/2023     0 DOS: the patient was seen and examined on 11/30/2023   Brief hospital course:  68 y.o. female with history of diabetes mellitus, Crohn's disease, CKD, HFpEF, recent diagnosis of multifocal pneumonia discharged from [facility] 11/28/2023 presenting back to ED with symptoms of worsening shortness of breath.    Assessment and Plan:   Concern for sepsis - Initial tachycardia, tachypnea, lactate 2.0, give concern for sepsis.  Was given initial IV fluid bolus, blood cultures, empiric antibiotics.  Monitor blood pressure closely.  Trend lactate.  Acute hypoxic respiratory failure - Noted worsening dyspnea overnight, required to maintain O2 sats greater than 90%..  Will continue to wean O2 as tolerated.   Possible acute exacerbation of chronic HFpEF - Bilateral congestion on x-ray could be interpreted as worsening pulmonary edema.  Mildly elevated BNP, noted basilar crackles bilaterally.  Noted noncompliance with cardiac regimen as well.  Slightly worsening hypoxia.  Continue metoprolol  tartrate 25 mg twice daily.  Hopefully responding well to IV Lasix  60 mg every 12 hours.  No urine output charted unfortunately.  1500 mL fluid restriction.  Monitor urine output.   Multifocal pneumonia (POA) - Initiated on antibiotic coverage at prior admission, discharged 5/1 on p.o. Augmentin  plus azithromycin .  Chest x-ray personally reviewed looks very similar to previous admission.  No leukocytosis, purulent sputum.  Rapid COVID/flu/RSV negative.  Changed antibiotics to p.o. doxycycline .  Schedule DuoNeb every 6 hours, albuterol  nebulizers as needed.  No active wheezing appreciated, will hold off on IV Solu-Medrol .  Pleuritic chest pain - Patient with sharp stabbing type pain exacerbated by deep inhalation and coughing.  Likely pleuritic in nature secondary to previous pneumonia.  Will order IV Toradol  15 mg every 6 hours.    Dysphagia with dehydration - Patient reported decreased p.o. intake as well as a inability to take her home medication regiment for over a week.  Admits to pills feeling stuck in her esophagus.  Suspect mild effective ketosis/dehydration.  Received IV fluid bolus.  Evaluated by speech/swallow.  Recommending outpatient follow-up with GI.  Patient counseled on good practices, slow rate, small bites, elevated head of bed during and after intake.  Mild hypokalemia - 3.0 on presentation.  Showed improvement with replenishment.  Continue p.o. potassium supplementation 40 equivalents twice daily.  Given diuresis above, may promote potassium wasting.  Will recheck BMP and magnesium  in AM.   Diabetes mellitus, uncontrolled - A1c 9.8 suggesting very poor control.  Patient admits that her stress levels very high and her blood sugars have been running higher than normal.  Apparently only uses monjero injection.  Continue on insulin  glargine 10 units at bedtime, insulin  sliding scale.  Carb consistent diet.   Anxiety - Likely contributing substantially to her symptoms.  Patient states she has not been able to take her medications.  Restarted patient's anxiety medications.   Hypertension - Metoprolol  started as noted above.  Will await volume resuscitation before restarting other home medication regimen.   GERD - Protonix  40 mg daily initiated.  May be contributing to patient's dysphagia.   Restless leg syndrome - Pramipexole  1 mg at night initiated.   Subjective: Patient feeling slightly improved today however overnight felt more short of breath requiring supplemental oxygen.  Continues on 2 L supplemental O2.  Complaining of sharp lower left chest pain, worse with deep breaths.  States urine output has been good however nothing has been charted.  Denies purulent  sputum, fever, nausea, vomiting, abdominal pain.  Physical Exam: Vitals:   11/30/23 0927 11/30/23 0941 11/30/23 1200 11/30/23 1342  BP:  129/70  101/74   Pulse: (!) 103  83   Resp: 19  (!) 21   Temp:  98.2 F (36.8 C)  98.4 F (36.9 C)  TempSrc:  Oral  Oral  SpO2: 98%  96%   Weight:      Height:        GENERAL:  Alert, pleasant, anxious HEENT:  EOMI, nasal cannula CARDIOVASCULAR: Regular and tachycardic RESPIRATORY: Dry cough, basilar crackles bilaterally, no wheezing appreciated GASTROINTESTINAL:  Soft, nontender, nondistended EXTREMITIES:  No LE edema bilaterally NEURO:  No new focal deficits appreciated SKIN:  No rashes noted PSYCH:  Appropriate mood and affect, anxious   Data Reviewed:  No new imaging to reviewed  Labs: CBC: Recent Labs  Lab 11/26/23 1011 11/27/23 1113 11/28/23 0531 11/29/23 1238 11/30/23 0540  WBC 4.4 5.1 4.9 7.0 4.6  NEUTROABS 3.5  --   --   --   --   HGB 10.9* 11.4* 11.0* 10.5* 9.1*  HCT 35.1 35.3* 33.1* 32.9* 27.7*  MCV 83 79.5* 79.6* 80.6 80.1  PLT 301 376 395 501* 484*   Basic Metabolic Panel: Recent Labs  Lab 11/26/23 1011 11/27/23 1113 11/28/23 0531 11/29/23 1238 11/30/23 0540  NA 134 131* 137 132* 135  K 3.3* 3.1* 2.6* 3.0* 3.5  CL 99 106 110 101 101  CO2 16* 20* 22 18* 24  GLUCOSE 227* 205* 109* 234* 112*  BUN 6* 7* <5* <5* <5*  CREATININE 0.73 0.63 0.54 0.61 0.53  CALCIUM  8.1* 8.1* 7.7* 8.3* 8.0*  MG  --   --  2.1 1.7  --    Liver Function Tests: No results for input(s): "AST", "ALT", "ALKPHOS", "BILITOT", "PROT", "ALBUMIN" in the last 168 hours. CBG: Recent Labs  Lab 11/28/23 0810 11/28/23 1154 11/29/23 2243 11/30/23 0751 11/30/23 1147  GLUCAP 111* 137* 225* 139* 122*    Scheduled Meds:  aspirin  EC  81 mg Oral Daily   clopidogrel   75 mg Oral Q breakfast   doxycycline   100 mg Oral Q12H   enoxaparin  (LOVENOX ) injection  40 mg Subcutaneous Q24H   furosemide   60 mg Intravenous Q12H   insulin  aspart  0-15 Units Subcutaneous TID WC   insulin  glargine-yfgn  10 Units Subcutaneous QHS   ipratropium-albuterol   3 mL Nebulization Q6H   ketorolac    15 mg Intravenous Q6H   metoprolol  tartrate  25 mg Oral BID   pantoprazole   40 mg Oral Daily   potassium chloride   40 mEq Oral BID   pramipexole   1 mg Oral QHS   QUEtiapine   300 mg Oral QHS   rosuvastatin   5 mg Oral Daily   sertraline   100 mg Oral Daily   Continuous Infusions: PRN Meds:.acetaminophen , albuterol , ALPRAZolam , guaiFENesin , ondansetron , traZODone   Family Communication: None at bedside  Disposition: Status is: Inpatient Remains inpatient appropriate because: Acute hypoxic respiratory failure     Time spent: 52 minutes  Author: Jodeane Mulligan, DO 11/30/2023 2:51 PM  For on call review [email or web address].

## 2023-11-30 NOTE — Evaluation (Signed)
 Clinical/Bedside Swallow Evaluation Patient Details  Name: Amy Hansen MRN: [id number] Date of Birth: [birth year]  Today's Date: 11/30/2023 Time: SLP Start Time (ACUTE ONLY): 1000 SLP Stop Time (ACUTE ONLY): 1030 SLP Time Calculation (min) (ACUTE ONLY): 30 min  Past Medical History:  Past Medical History:  Diagnosis Date   Acute on chronic heart failure with preserved ejection fraction (HFpEF) (HCC) 05/16/2018   AKI (acute kidney injury) (HCC) 02/05/2023   Anemia    Anxiety    Arthritis    Blood transfusion without reported diagnosis    Cataract    CHF (congestive heart failure) (HCC)    Chronic kidney disease    UTI, hematuria in urine   Colitis    Crohn's disease (HCC)    Depression    Diabetes (HCC)    Diverticulosis    Frequent headaches    Interstitial cystitis    Recurrent UTI    Restless leg syndrome    TIA (transient ischemic attack) 02/20/2021   Urinary frequency    Past Surgical History:  Past Surgical History:  Procedure Laterality Date   bariatric bypass  2012   BIOPSY  05/03/2020   Procedure: BIOPSY;  Surgeon: Janel Medford, MD;  Location: [facility] ENDOSCOPY;  Service: Endoscopy;;   CARPAL TUNNEL RELEASE Right 2003   CARPAL TUNNEL RELEASE Right    2008   CHOLECYSTECTOMY  1975   COLONOSCOPY     COLONOSCOPY WITH PROPOFOL  N/A 05/03/2020   Procedure: COLONOSCOPY WITH PROPOFOL ;  Surgeon: Janel Medford, MD;  Location: [facility] ENDOSCOPY;  Service: Endoscopy;  Laterality: N/A;   CORONARY STENT INTERVENTION N/A 05/15/2023   Procedure: CORONARY STENT INTERVENTION;  Surgeon: Sammy Crisp, MD;  Location: [facility] INVASIVE CV LAB;  Service: Cardiovascular;  Laterality: N/A;   CYSTOSCOPY W/ RETROGRADES Bilateral 06/06/2015   Procedure: CYSTOSCOPY WITH RETROGRADE PYELOGRAM;  Surgeon: Christina Coyer, MD;  Location: [facility] ORS;  Service: Urology;  Laterality: Bilateral;   EYE SURGERY Left 07/06/2022   FL INJ LEFT KNEE CT ARTHROGRAM ([facility] HX) Left    1995   GASTRIC BYPASS   2010   HAND SURGERY Left 01/19/2021   Thumb   HEMORRHOID SURGERY  2013   KNEE ARTHROSCOPY Left 1996   LEFT HEART CATH AND CORONARY ANGIOGRAPHY Left 05/15/2023   Procedure: LEFT HEART CATH AND CORONARY ANGIOGRAPHY;  Surgeon: Sammy Crisp, MD;  Location: [facility] INVASIVE CV LAB;  Service: Cardiovascular;  Laterality: Left;   TONSILLECTOMY     TOTAL ABDOMINAL HYSTERECTOMY W/ BILATERAL SALPINGOOPHORECTOMY     HPI:  Per H&P: "Amy Hansen is a 67 y.o. female with history of diabetes mellitus, Crohn's disease, CKD, HFpEF, recent diagnosis of multifocal pneumonia discharged from [facility] 11/28/2023 presenting back to ED with symptoms of worsening shortness of breath.  Patient was feeling well prior to discharge yesterday however on returning home felt that her cough and shortness of breath became progressively worse.  Was able to pick up her antibiotics and inhaler and use them as prescribed without any improvement.  Denies any purulent sputum, fever, chest pain, nausea, vomiting, abdominal pain.  Does admit that she has had trouble swallowing and has not taken any of her other medications for the past week including her cardiac medications and Lasix .  States that when she tries to swallow pills they get stuck and she cannot get them down. 11/29/23: "Bilateral pneumonia, increased in the right middle lobe and mildly improved elsewhere."    Assessment / Plan / Recommendation  Clinical Impression  Pt seen  for bedside swallow assessment in the setting of concern for dysphagia (specifically regarding globus sensation with pill administration). 2L nasal canula in place, with O2 saturations maintained between 94-96 for duration of session. Pt seen with trials of thin liquids (via straw) and regular solids. No overt or subtle s/sx pharyngeal dysphagia noted. No change to vocal quality across trials. Vitals stable for duration of trials. Oral phase grossly intact- with complete manipulation and clearance of regular solid  from oral cavity. Pt reporting globus sensation and discomfort with regular solids (similar to that reported with medication administration). Education provided for [employer] selection (soft, moist solids) and options for medication administration. Pt reports plan to complete esophageal assessment (as was the plan following BSE in 2024), but is has not been completed yet.   Based on esophageal concerns/GERD, current resp/pulmonary status, and debility, pt is at increased risk of aspiration during and after the swallow. This risk is managed with esophageal and aspiration precautions (slow rate, small bites, elevated HOB during/after intake, and alert for PO intake). Education shared regarding precautions- pt reported understanding. Continue with current unrestricted diet. Medications whole in puree as able. MD and RN aware of recommendations. No further acute SLP services indicated at this time. SLP Visit Diagnosis: Dysphagia, unspecified (R13.10) (suspect esophageal)    Aspiration Risk  Moderate aspiration risk    Diet Recommendation   Thin;Age appropriate regular  Medication Administration: Whole meds with puree    Other  Recommendations Recommended Consults: Consider GI evaluation;Consider esophageal assessment (OP vx inpatient) Oral Care Recommendations: Oral care BID    Recommendations for follow up therapy are one component of a multi-disciplinary discharge planning process, led by the attending physician.  Recommendations may be updated based on patient status, additional functional criteria and insurance authorization.  Follow up Recommendations No SLP follow up      Assistance Recommended at Discharge  Ind with PO intake  Functional Status Assessment Patient has not had a recent decline in their functional status (suspect baseline esophageal concerns (at least starting in July 2024))    Swallow Study   General Date of Onset: 11/30/23 HPI: Per H&P: "Amy Hansen is a 68  y.o. female with history of diabetes mellitus, Crohn's disease, CKD, HFpEF, recent diagnosis of multifocal pneumonia discharged from [facility] 11/28/2023 presenting back to ED with symptoms of worsening shortness of breath.  Patient was feeling well prior to discharge yesterday however on returning home felt that her cough and shortness of breath became progressively worse.  Was able to pick up her antibiotics and inhaler and use them as prescribed without any improvement.  Denies any purulent sputum, fever, chest pain, nausea, vomiting, abdominal pain.  Does admit that she has had trouble swallowing and has not taken any of her other medications for the past week including her cardiac medications and Lasix .  States that when she tries to swallow pills they get stuck and she cannot get them down. 11/29/23: "Bilateral pneumonia, increased in the right middle lobe and mildly improved elsewhere." Type of Study: Bedside Swallow Evaluation Previous Swallow Assessment: Pt seen 02/05/23-02/07/23- with pt discharged on thin liquids and regular solids and reported plan to complete a esophagram as an OP. Diet Prior to this Study: Regular;Thin liquids (Level 0) Temperature Spikes Noted: No (99 (4.6 WBC)) Respiratory Status: Nasal cannula (2L) History of Recent Intubation: No Behavior/Cognition: Alert;Cooperative;Pleasant mood Oral Cavity Assessment: Within Functional Limits Oral Care Completed by SLP: Recent completion by staff Oral Cavity - Dentition: Adequate natural  dentition Vision: Functional for self-feeding Self-Feeding Abilities: Able to feed self Patient Positioning: Upright in bed Baseline Vocal Quality: Normal Volitional Cough: Strong Volitional Swallow: Able to elicit    Oral/Motor/Sensory Function Overall Oral Motor/Sensory Function: Within functional limits   Ice Chips Ice chips: Not tested   Thin Liquid Thin Liquid: Within functional limits Presentation: Straw;Self Fed    Nectar Thick Nectar Thick  Liquid: Not tested   Honey Thick Honey Thick Liquid: Not tested   Puree Puree: Not tested   Solid     Solid: Within functional limits Presentation: Self Fed     [place] Romolo Sieling Clapp, MS, CCC-SLP Speech Language Pathologist Rehab Services; [facility] [place] [phone] (ascom)   [place] J Clapp 11/30/2023,12:08 PM

## 2023-12-01 DIAGNOSIS — J189 Pneumonia, unspecified organism: Secondary | ICD-10-CM | POA: Diagnosis not present

## 2023-12-01 DIAGNOSIS — J9601 Acute respiratory failure with hypoxia: Secondary | ICD-10-CM | POA: Diagnosis not present

## 2023-12-01 DIAGNOSIS — I5033 Acute on chronic diastolic (congestive) heart failure: Secondary | ICD-10-CM | POA: Diagnosis not present

## 2023-12-01 DIAGNOSIS — F419 Anxiety disorder, unspecified: Secondary | ICD-10-CM

## 2023-12-01 DIAGNOSIS — R1310 Dysphagia, unspecified: Secondary | ICD-10-CM | POA: Diagnosis not present

## 2023-12-01 LAB — CBC
HCT: 33.7 % — ABNORMAL LOW (ref 36.0–46.0)
Hemoglobin: 11 g/dL — ABNORMAL LOW (ref 12.0–15.0)
MCH: 25.8 pg — ABNORMAL LOW (ref 26.0–34.0)
MCHC: 32.6 g/dL (ref 30.0–36.0)
MCV: 78.9 fL — ABNORMAL LOW (ref 80.0–100.0)
Platelets: 671 10*3/uL — ABNORMAL HIGH (ref 150–400)
RBC: 4.27 MIL/uL (ref 3.87–5.11)
RDW: 15.6 % — ABNORMAL HIGH (ref 11.5–15.5)
WBC: 4.6 10*3/uL (ref 4.0–10.5)
nRBC: 0 % (ref 0.0–0.2)

## 2023-12-01 LAB — BASIC METABOLIC PANEL WITH GFR
Anion gap: 12 (ref 5–15)
BUN: 10 mg/dL (ref 8–23)
CO2: 24 mmol/L (ref 22–32)
Calcium: 8.5 mg/dL — ABNORMAL LOW (ref 8.9–10.3)
Chloride: 99 mmol/L (ref 98–111)
Creatinine, Ser: 0.67 mg/dL (ref 0.44–1.00)
GFR, Estimated: >60 mL/min (ref >60)
Glucose, Bld: 100 mg/dL — ABNORMAL HIGH (ref 70–99)
Potassium: 3.5 mmol/L (ref 3.5–5.1)
Sodium: 135 mmol/L (ref 135–145)

## 2023-12-01 LAB — GLUCOSE, CAPILLARY
Glucose-Capillary: 137 mg/dL — ABNORMAL HIGH (ref 70–99)
Glucose-Capillary: 63 mg/dL — ABNORMAL LOW (ref 70–99)

## 2023-12-01 LAB — MAGNESIUM: Magnesium: 1.9 mg/dL (ref 1.7–2.4)

## 2023-12-01 MED ORDER — DOXYCYCLINE HYCLATE 100 MG PO TABS: 100.0000 mg | ORAL_TABLET | Freq: Two times a day (BID) | ORAL | 0 refills | Status: DC

## 2023-12-01 MED ORDER — ALBUTEROL SULFATE (2.5 MG/3ML) 0.083% IN NEBU: 2.5000 mg | INHALATION_SOLUTION | Freq: Four times a day (QID) | RESPIRATORY_TRACT | 3 refills | Status: DC | PRN

## 2023-12-01 MED ORDER — COMPRESSOR/NEBULIZER MISC: 1.0000 | 0 refills | Status: DC

## 2023-12-01 MED ORDER — FUROSEMIDE 40 MG PO TABS: 40.0000 mg | ORAL_TABLET | Freq: Two times a day (BID) | ORAL | 0 refills | Status: DC

## 2023-12-01 MED ORDER — COMPRESSOR/NEBULIZER MISC: 1.0000 | 0 refills | Status: AC

## 2023-12-01 MED ORDER — FUROSEMIDE 40 MG PO TABS
40.0000 mg | ORAL_TABLET | Freq: Two times a day (BID) | ORAL | 0 refills | Status: DC
Start: 2023-12-01 — End: 2023-12-01

## 2023-12-01 NOTE — Plan of Care (Signed)

## 2023-12-01 NOTE — Discharge Summary (Signed)
 Physician Discharge Summary   Patient: Amy Hansen MRN: [id number] DOB: March 08, 1956  Admit date:     11/29/2023  Discharge date: 12/01/23  Discharge Physician: Jodeane Mulligan   PCP: Aileen Alexanders, NP   Recommendations at discharge:    Pt to be discharged home.   If you experience worsening fever, chills, chest pain, shortness of breath, or other concerning symptoms, please call your PCP or go to the emergency department immediately.  Discharge Diagnoses: Principal Problem:   Acute hypoxic respiratory failure (HCC) Active Problems:   Dysphagia   Uncontrolled type 2 diabetes mellitus with hyperglycemia, without long-term current use of insulin  (HCC)   Acute on chronic heart failure with preserved ejection fraction (HFpEF) (HCC)   Sepsis (HCC)   Multifocal pneumonia  Resolved Problems:   * No resolved hospital problems. [facility] Course:  68 y.o. female with history of diabetes mellitus, Crohn's disease, CKD, HFpEF, recent diagnosis of multifocal pneumonia discharged from [facility] 11/28/2023 presenting back to ED with symptoms of worsening shortness of breath.     Assessment and Plan:   Concern for sepsis - Initial tachycardia, tachypnea, lactate 2.0, give concern for sepsis.  Was given initial IV fluid bolus, blood cultures, empiric antibiotics.  Monitor blood pressure closely.  Resolved  Acute hypoxic respiratory failure - Noted worsening dyspnea , required 2l maintain O2 sats greater than 90%..  Was able to be weaned back to room air.  Hypoxia resolved.  Possible acute exacerbation of chronic HFpEF - Bilateral congestion on x-ray could be interpreted as worsening pulmonary edema.  Mildly elevated BNP, noted basilar crackles bilaterally.  Noted noncompliance with cardiac regimen as well.  Slightly worsening hypoxia.  Continue metoprolol  tartrate 25 mg twice daily.  Responded well to IV Lasix  60 mg every 12 hours.  Will transition patient to p.o. furosemide  40 mg twice daily  for the next 3 days then transition back to patient's normal furosemide  20 mg as needed.  Continue other cardiac home medication regiment.  Multifocal pneumonia (POA) - Initiated on antibiotic coverage at prior admission, discharged 5/1 on p.o. Augmentin  plus azithromycin .  Chest x-ray personally reviewed looks very similar to previous admission.  No leukocytosis, purulent sputum.  Rapid COVID/flu/RSV negative.  Changed antibiotics to p.o. doxycycline .  Schedule DuoNeb every 6 hours, albuterol  nebulizers as needed.  No active wheezing appreciated, will hold off on IV Solu-Medrol .  Will discharge patient with prescription for p.o. doxycycline  to take for the next 5 days.  Will give prescription for home nebulizer and albuterol  nebs as patient feels improved efficacy with the nebulizer.   Pleuritic chest pain - Patient with sharp stabbing type pain exacerbated by deep inhalation and coughing.  Likely pleuritic in nature secondary to previous pneumonia.  Resolved after IV Toradol  15 mg.   Dysphagia with dehydration - Patient reported decreased p.o. intake as well as a inability to take her home medication regiment for over a week.  Admits to pills feeling stuck in her esophagus.  Suspect mild effective ketosis/dehydration.  Received IV fluid bolus.  Evaluated by speech/swallow.  Recommending outpatient follow-up with GI.  Patient counseled on good practices, slow rate, small bites, elevated head of bed during and after intake.   Mild hypokalemia - 3.0 on presentation.  Resolved after replenishment.  Diabetes mellitus, uncontrolled - A1c 9.8 suggesting very poor control.  Patient admits that her stress levels very high and her blood sugars have been running higher than normal.  Apparently only uses monjero injection.  Received insulin  glargine 10 units at bedtime, insulin  sliding scale.  Carb consistent diet.  Would recommend that patient follow closely with outpatient provider/primary care provider to  work on diabetic regiment.  Informed patient about long-term consequences of uncontrolled diabetes including advanced kidney disease, blindness, neuropathy, and increased cardiovascular risk of stroke and heart attack.   Recommend yearly foot/eye/urine protein exams.   Anxiety - Likely contributing substantially to her symptoms.  Restarted patient's anxiety medications.   Hypertension - Metoprolol  started as noted above.  Will await volume resuscitation before restarting other home medication regimen.   GERD - Resume Protonix .  May be contributing to patient's dysphagia.  Patient to follow-up with GI in the outpatient setting.   Restless leg syndrome - Pramipexole  1 mg at night.   Consultants: None Procedures performed: None Disposition: Home Diet recommendation:  Discharge Diet Orders (From admission, onward)     Start     Ordered   12/01/23 0000  Diet - low sodium heart healthy        12/01/23 1121           Carb modified diet  DISCHARGE MEDICATION: Allergies as of 12/01/2023       Reactions   Avelox [moxifloxacin Hcl In Nacl] Anaphylaxis   Bactrim  [sulfamethoxazole -trimethoprim ] Anaphylaxis   Ciprofloxacin  Other (See Comments)   Pt states she was told never to take this as it is in the same family as Avelox.    Buspar  [buspirone ] Other (See Comments)   hallucinations   Neomycin -polymyxin-dexameth Other (See Comments)   Burning in the eyes   Aquaphor [lanolin-petrolatum] Itching, Rash   Depakote [divalproex Sodium] Other (See Comments)   Unknown Reaction   Imitrex  [sumatriptan ] Other (See Comments)   Neck and shoulder pain   Stadol [butorphanol] Rash        Medication List     STOP taking these medications    amoxicillin -clavulanate 875-125 MG tablet Commonly known as: AUGMENTIN    azithromycin  500 MG tablet Commonly known as: Zithromax        TAKE these medications    albuterol  108 (90 Base) MCG/ACT inhaler Commonly known as: VENTOLIN  HFA Inhale  2 puffs into the lungs every 6 (six) hours as needed for wheezing or shortness of breath. What changed: Another medication with the same name was added. Make sure you understand how and when to take each.   albuterol  (2.5 MG/3ML) 0.083% nebulizer solution Commonly known as: PROVENTIL  Take 3 mLs (2.5 mg total) by nebulization every 6 (six) hours as needed for wheezing or shortness of breath. What changed: You were already taking a medication with the same name, and this prescription was added. Make sure you understand how and when to take each.   ALPRAZolam  0.5 MG tablet Commonly known as: XANAX  Take 1 tablet (0.5 mg total) by mouth 2 (two) times daily as needed for anxiety. Must last 15 days.   aspirin  EC 81 MG tablet Take 1 tablet (81 mg) by mouth once daily. Swallow whole.   clopidogrel  75 MG tablet Commonly known as: PLAVIX  Take 1 tablet (75 mg total) by mouth daily with breakfast.   Compressor/Nebulizer Misc 1 each by Does not apply route as directed.   doxycycline  100 MG tablet Commonly known as: VIBRA -TABS Take 1 tablet (100 mg total) by mouth every 12 (twelve) hours.   estradiol  0.1 MG/GM vaginal cream Commonly known as: ESTRACE  Apply one pea-sized amount around the opening of the urethra 3 times weekly. What changed:  how much to take how  to take this when to take this   furosemide  20 MG tablet Commonly known as: LASIX  Take 1 tablet (20 mg total) by mouth daily as needed (for swelling in the legs accompanied by a weight gain of 3 pounds overnight -- USE SPARINGLY). What changed: reasons to take this   furosemide  40 MG tablet Commonly known as: Lasix  Take 1 tablet (40 mg total) by mouth 2 (two) times daily for 2 days. What changed: You were already taking a medication with the same name, and this prescription was added. Make sure you understand how and when to take each.   guaiFENesin  100 MG/5ML liquid Commonly known as: ROBITUSSIN Take 5 mLs by mouth every 6  (six) hours as needed for to loosen phlegm. What changed: reasons to take this   insulin  aspart 100 UNIT/ML injection Commonly known as: novoLOG  Inject 0-6 Units into the skin as directed. Only used as needed, sliding scale   insulin  glargine 100 UNIT/ML injection Commonly known as: LANTUS  Inject 1-15 Units into the skin as directed. Sliding scale as needed   isosorbide  mononitrate 30 MG 24 hr tablet Commonly known as: IMDUR  Take 0.5 tablets (15 mg total) by mouth daily.   metoprolol  tartrate 25 MG tablet Commonly known as: LOPRESSOR  Take 25 mg by mouth 2 (two) times daily.   nitroGLYCERIN  0.4 MG SL tablet Commonly known as: NITROSTAT  Place 1 tablet (0.4 mg total) under the tongue every 5 (five) minutes as needed. What changed: reasons to take this   ondansetron  8 MG disintegrating tablet Commonly known as: ZOFRAN -ODT Take 1 tablet (8 mg total) by mouth every 8 (eight) hours as needed for nausea or vomiting.   pantoprazole  40 MG tablet Commonly known as: PROTONIX  Take 1 tablet (40 mg total) by mouth daily.   pramipexole  1 MG tablet Commonly known as: MIRAPEX  Take 1 tablet (1 mg total) by mouth at bedtime.   QUEtiapine  300 MG tablet Commonly known as: SEROQUEL  Take 1 tablet (300 mg total) by mouth at bedtime.   rOPINIRole  1 MG tablet Commonly known as: REQUIP  Take 1 tablet (1 mg total) by mouth 2 (two) times daily.   rosuvastatin  5 MG tablet Commonly known as: CRESTOR  TAKE 1 TABLET DAILY   sertraline  100 MG tablet Commonly known as: ZOLOFT  Take 1 tablet (100 mg total) by mouth daily.   tirzepatide  5 MG/0.5ML Pen Commonly known as: MOUNJARO  Inject 5 mg into the skin once a week.   ursodiol 300 MG capsule Commonly known as: ACTIGALL Take 300 mg by mouth 2 (two) times daily. Take 2 capsules in the AM and 1 capsule in the PM         Discharge Exam: Filed Weights   11/29/23 1239  Weight: 51.7 kg    GENERAL:  Alert, pleasant, anxious HEENT:  EOMI,  nasal cannula CARDIOVASCULAR: Regular and tachycardic RESPIRATORY: Dry cough, basilar crackles bilaterally, no wheezing appreciated GASTROINTESTINAL:  Soft, nontender, nondistended EXTREMITIES:  No LE edema bilaterally NEURO:  No new focal deficits appreciated SKIN:  No rashes noted PSYCH:  Appropriate mood and affect, anxious   Condition at discharge: improving  The results of significant diagnostics from this hospitalization (including imaging, microbiology, ancillary and laboratory) are listed below for reference.   Imaging Studies: DG Chest 2 View Result Date: 11/29/2023 CLINICAL DATA:  Increased shortness of breath. Diagnosed with bilateral pneumonia. EXAM: CHEST - 2 VIEW COMPARISON:  11/26/2023 FINDINGS: Normal sized heart. Partially calcified thoracic aorta. Moderate patchy density in the right middle lobe, increased.  Minimal patchy densities elsewhere in both lungs with mild interval improvement. No pleural fluid. Old left humeral neck fracture. IMPRESSION: Bilateral pneumonia, increased in the right middle lobe and mildly improved elsewhere. Electronically Signed   By: Catherin Closs M.D.   On: 11/29/2023 13:43   CT Angio Chest PE W and/or Wo Contrast Result Date: 11/27/2023 CLINICAL DATA:  Concern for pulmonary embolism. EXAM: CT ANGIOGRAPHY CHEST WITH CONTRAST TECHNIQUE: Multidetector CT imaging of the chest was performed using the standard protocol during bolus administration of intravenous contrast. Multiplanar CT image reconstructions and MIPs were obtained to evaluate the vascular anatomy. RADIATION DOSE REDUCTION: This exam was performed according to the departmental dose-optimization program which includes automated exposure control, adjustment of the mA and/or kV according to patient size and/or use of iterative reconstruction technique. CONTRAST:  75mL OMNIPAQUE  IOHEXOL  350 MG/ML SOLN COMPARISON:  Chest radiograph dated 11/26/2023. FINDINGS: Cardiovascular: There is no cardiomegaly  or pericardial effusion. There is coronary vascular calcification and stent. Mild atherosclerotic calcification of the thoracic aorta. No aneurysmal dilatation or dissection. The origins of the great vessels of the aortic arch are patent. No pulmonary artery embolus identified. Mediastinum/Nodes: No hilar or mediastinal adenopathy. Small hiatal hernia. The esophagus is grossly unremarkable. No mediastinal fluid collection. Lungs/Pleura: Bilateral patchy ground-glass densities consistent with multifocal pneumonia. Follow-up to resolution recommended. No pleural effusion or pneumothorax. The central airways are patent. Upper Abdomen: Severe fatty liver. Postsurgical changes of gastric bypass. A 1 cm left renal upper pole stone. Musculoskeletal: Osteopenia with degenerative changes. No acute osseous pathology. Review of the MIP images confirms the above findings. IMPRESSION: 1. No CT evidence of pulmonary artery embolus. 2. Bilateral multifocal pneumonia. Follow-up to resolution recommended. 3. Severe fatty liver. 4. A 1 cm left renal upper pole stone. 5.  Aortic Atherosclerosis (ICD10-I70.0). Electronically Signed   By: Angus Bark M.D.   On: 11/27/2023 14:17   DG Chest 2 View Result Date: 11/26/2023 CLINICAL DATA:  Pneumonia. EXAM: CHEST - 2 VIEW COMPARISON:  Chest radiograph dated 08/24/2023. FINDINGS: There is diffuse interstitial coarsening and bronchitic changes. Bilateral streaky and interstitial densities consistent with pneumonia, possibly atypical in etiology. Clinical correlation is recommended. No pleural effusion or pneumothorax. The cardiac silhouette is within normal limits. No acute osseous pathology. IMPRESSION: Bilateral pneumonia. Electronically Signed   By: Angus Bark M.D.   On: 11/26/2023 11:40   US  RENAL Result Date: 11/07/2023 CLINICAL DATA:  Left lower quadrant abdomen pain.  Gross hematuria. EXAM: RENAL / URINARY TRACT ULTRASOUND COMPLETE COMPARISON:  CT abdomen and pelvis February 11, 2023 FINDINGS: Right Kidney: Renal measurements: 10.4 x 5.5 x 5.3 cm = volume: 157.7 mL. Echogenicity within normal limits. No mass or hydronephrosis visualized. Left Kidney: Renal measurements: 9.6 x 5.2 x 4.9 cm = volume: 129.3 mL. There is a 1 cm nonobstructing stone in the left kidney. Echogenicity within normal limits. No mass or hydronephrosis visualized. Bladder: Appears normal for degree of bladder distention. Other: None. IMPRESSION: 1. No acute abnormality identified. 2. 1 cm nonobstructing stone in the left kidney. Electronically Signed   By: Anna Barnes M.D.   On: 11/07/2023 13:59    Microbiology: Results for orders placed or performed during the hospital encounter of 11/29/23  Resp panel by RT-PCR (RSV, Flu A&B, Covid) Anterior Nasal Swab     Status: None   Collection Time: 11/29/23 12:39 PM   Specimen: Anterior Nasal Swab  Result Value Ref Range Status   SARS Coronavirus 2 by RT PCR NEGATIVE  NEGATIVE Final    Comment: (NOTE) SARS-CoV-2 target nucleic acids are NOT DETECTED.  The SARS-CoV-2 RNA is generally detectable in upper respiratory specimens during the acute phase of infection. The lowest concentration of SARS-CoV-2 viral copies this assay can detect is 138 copies/mL. A negative result does not preclude SARS-Cov-2 infection and should not be used as the sole basis for treatment or other patient management decisions. A negative result may occur with  improper specimen collection/handling, submission of specimen other than nasopharyngeal swab, presence of viral mutation(s) within the areas targeted by this assay, and inadequate number of viral copies(<138 copies/mL). A negative result must be combined with clinical observations, patient history, and epidemiological information. The expected result is Negative.  Fact Sheet for Patients:  BloggerCourse.com  Fact Sheet for Healthcare Providers:   SeriousBroker.it  This test is no t yet approved or cleared by the [place]  FDA and  has been authorized for detection and/or diagnosis of SARS-CoV-2 by FDA under an Emergency Use Authorization (EUA). This EUA will remain  in effect (meaning this test can be used) for the duration of the COVID-19 declaration under Section 564(b)(1) of the Act, 21 U.S.C.section 360bbb-3(b)(1), unless the authorization is terminated  or revoked sooner.       Influenza A by PCR NEGATIVE NEGATIVE Final   Influenza B by PCR NEGATIVE NEGATIVE Final    Comment: (NOTE) The Xpert Xpress SARS-CoV-2/FLU/RSV plus assay is intended as an aid in the diagnosis of influenza from Nasopharyngeal swab specimens and should not be used as a sole basis for treatment. Nasal washings and aspirates are unacceptable for Xpert Xpress SARS-CoV-2/FLU/RSV testing.  Fact Sheet for Patients: BloggerCourse.com  Fact Sheet for Healthcare Providers: SeriousBroker.it  This test is not yet approved or cleared by the [place]  FDA and has been authorized for detection and/or diagnosis of SARS-CoV-2 by FDA under an Emergency Use Authorization (EUA). This EUA will remain in effect (meaning this test can be used) for the duration of the COVID-19 declaration under Section 564(b)(1) of the Act, 21 U.S.C. section 360bbb-3(b)(1), unless the authorization is terminated or revoked.     Resp Syncytial Virus by PCR NEGATIVE NEGATIVE Final    Comment: (NOTE) Fact Sheet for Patients: BloggerCourse.com  Fact Sheet for Healthcare Providers: SeriousBroker.it  This test is not yet approved or cleared by the [place]  FDA and has been authorized for detection and/or diagnosis of SARS-CoV-2 by FDA under an Emergency Use Authorization (EUA). This EUA will remain in effect (meaning this test can be used) for  the duration of the COVID-19 declaration under Section 564(b)(1) of the Act, 21 U.S.C. section 360bbb-3(b)(1), unless the authorization is terminated or revoked.  Performed at [facility], [address] [place], [place] [address]   Culture, blood (routine x 2)     Status: None (Preliminary result)   Collection Time: 11/29/23  3:25 PM   Specimen: BLOOD  Result Value Ref Range Status   Specimen Description BLOOD BLOOD LEFT ARM  Final   Special Requests   Final    BOTTLES DRAWN AEROBIC AND ANAEROBIC Blood Culture adequate volume   Culture   Final    NO GROWTH 2 DAYS Performed at [facility], [address], [place], [place] [address]    Report Status PENDING  Incomplete  Culture, blood (routine x 2)     Status: None (Preliminary result)   Collection Time: 11/29/23  3:26 PM   Specimen: BLOOD  Result Value Ref Range Status  Specimen Description BLOOD BLOOD LEFT ARM  Final   Special Requests   Final    BOTTLES DRAWN AEROBIC AND ANAEROBIC Blood Culture adequate volume   Culture   Final    NO GROWTH 2 DAYS Performed at [facility], [address], [place], [place] [address]    Report Status PENDING  Incomplete   *Note: Due to a large number of results and/or encounters for the requested time period, some results have not been displayed. A complete set of results can be found in Results Review.    Labs: CBC: Recent Labs  Lab 11/26/23 1011 11/27/23 1113 11/28/23 0531 11/29/23 1238 11/30/23 0540 12/01/23 0517  WBC 4.4 5.1 4.9 7.0 4.6 4.6  NEUTROABS 3.5  --   --   --   --   --   HGB 10.9* 11.4* 11.0* 10.5* 9.1* 11.0*  HCT 35.1 35.3* 33.1* 32.9* 27.7* 33.7*  MCV 83 79.5* 79.6* 80.6 80.1 78.9*  PLT 301 376 395 501* 484* 671*   Basic Metabolic Panel: Recent Labs  Lab 11/27/23 1113 11/28/23 0531 11/29/23 1238 11/30/23 0540 12/01/23 0517  NA 131* 137 132* 135 135  K 3.1* 2.6* 3.0* 3.5 3.5  CL 106 110 101 101 99  CO2 20* 22 18* 24 24   GLUCOSE 205* 109* 234* 112* 100*  BUN 7* <5* <5* <5* 10  CREATININE 0.63 0.54 0.61 0.53 0.67  CALCIUM  8.1* 7.7* 8.3* 8.0* 8.5*  MG  --  2.1 1.7  --  1.9   Liver Function Tests: No results for input(s): "AST", "ALT", "ALKPHOS", "BILITOT", "PROT", "ALBUMIN" in the last 168 hours. CBG: Recent Labs  Lab 11/30/23 0751 11/30/23 1147 11/30/23 1629 11/30/23 2111 12/01/23 0813  GLUCAP 139* 122* 227* 96 137*    Discharge time spent: 26 minutes.  Signed: Jodeane Mulligan, DO [facility] 12/01/2023

## 2023-12-02 ENCOUNTER — Telehealth: Payer: Self-pay

## 2023-12-02 ENCOUNTER — Ambulatory Visit: Payer: Self-pay

## 2023-12-02 DIAGNOSIS — J189 Pneumonia, unspecified organism: Secondary | ICD-10-CM | POA: Diagnosis not present

## 2023-12-02 DIAGNOSIS — J9601 Acute respiratory failure with hypoxia: Secondary | ICD-10-CM | POA: Diagnosis not present

## 2023-12-02 LAB — CULTURE, BLOOD (ROUTINE X 2)
Culture: NO GROWTH
Culture: NO GROWTH

## 2023-12-02 NOTE — Transitions of Care (Post Inpatient/ED Visit) (Signed)
 12/02/2023  Name: Amy Hansen MRN: [id number] DOB: [birth year]  Today's TOC FU Call Status: Today's TOC FU Call Status:: Successful TOC FU Call Completed TOC FU Call Complete Date: 12/02/23 Patient's Name and Date of Birth confirmed.  Transition Care Management Follow-up Telephone Call Date of Discharge: 11/28/23 Discharge Facility: [facility]) Type of Discharge: Emergency Department Reason for ED Visit: Respiratory Respiratory Diagnosis: Pnuemonia How have you been since you were released from the hospital?: Better Any questions or concerns?: No  Items Reviewed: Did you receive and understand the discharge instructions provided?: Yes Medications obtained,verified, and reconciled?: Yes (Medications Reviewed) Any new allergies since your discharge?: No Dietary orders reviewed?: Yes Do you have support at home?: Yes People in Home [RPT]: spouse  Medications Reviewed Today: Medications Reviewed Today     Reviewed by Darrall Ellison, LPN (Licensed Practical Nurse) on 12/02/23 at 1559  Med List Status: <None>   Medication Order Taking? Sig Documenting Provider Last Dose Status Informant  albuterol  (PROVENTIL ) (2.5 MG/3ML) 0.083% nebulizer solution [id number]  Take 3 mLs (2.5 mg total) by nebulization every 6 (six) hours as needed for wheezing or shortness of breath. Jodeane Mulligan, DO  Active   albuterol  (VENTOLIN  HFA) 108 [phone] Base) MCG/ACT inhaler [id number] No Inhale 2 puffs into the lungs every 6 (six) hours as needed for wheezing or shortness of breath. Jodeane Mulligan, DO Unknown Active Self, Pharmacy Records  ALPRAZolam  (XANAX ) 0.5 MG tablet [id number] No Take 1 tablet (0.5 mg total) by mouth 2 (two) times daily as needed for anxiety. Must last 15 days. Verneda Golder, PA-C Unknown Active Self, Pharmacy Records  aspirin  EC 81 MG tablet [id number] No Take 1 tablet (81 mg) by mouth once daily. Swallow whole. [provider] Past Week Active  Self, Pharmacy Records  clopidogrel  (PLAVIX ) 75 MG tablet [id number] No Take 1 tablet (75 mg total) by mouth daily with breakfast. Gollan, Timothy J, MD Past Week Active Self, Pharmacy Records  doxycycline  (VIBRA -TABS) 100 MG tablet [id number]  Take 1 tablet (100 mg total) by mouth every 12 (twelve) hours. Jodeane Mulligan, DO  Active   estradiol  (ESTRACE ) 0.1 MG/GM vaginal cream [id number] No Apply one pea-sized amount around the opening of the urethra 3 times weekly.  Patient taking differently: Place 1 Applicatorful vaginally 3 (three) times a week. Apply one pea-sized amount around the opening of the urethra 3 times weekly.   Vaillancourt, Samantha, PA-C Past Week Active Self, Pharmacy Records  furosemide  (LASIX ) 20 MG tablet [id number] No Take 1 tablet (20 mg total) by mouth daily as needed (for swelling in the legs accompanied by a weight gain of 3 pounds overnight -- USE SPARINGLY).  Patient taking differently: Take 20 mg by mouth daily as needed for fluid or edema (for swelling in the legs accompanied by a weight gain of 3 pounds overnight -- USE SPARINGLY).   Roark Chick, PA-C Unknown Active Self, Pharmacy Records  furosemide  (LASIX ) 40 MG tablet [id number]  Take 1 tablet (40 mg total) by mouth 2 (two) times daily for 2 days. Jodeane Mulligan, DO  Active   guaiFENesin  (ROBITUSSIN) 100 MG/5ML liquid [id number] No Take 5 mLs by mouth every 6 (six) hours as needed for to loosen phlegm.  Patient taking differently: Take 5 mLs by mouth every 6 (six) hours as needed for to loosen phlegm or cough.   Jodeane Mulligan, DO Unknown Active Self, Pharmacy Records  insulin  aspart (NOVOLOG ) 100 UNIT/ML injection [id number]  No Inject 0-6 Units into the skin as directed. Only used as needed, sliding scale [provider] Unknown Active Self, Pharmacy Records  insulin  glargine (LANTUS ) 100 UNIT/ML injection [id number] No Inject 1-15 Units into the skin as directed. Sliding scale as needed [provider] Unknown Active Self, Pharmacy Records  isosorbide  mononitrate (IMDUR ) 30 MG 24 hr tablet [id number] No Take 0.5 tablets (15 mg total) by mouth daily. Roark Chick, PA-C Past Week Expired 11/29/23 2359 Self, Pharmacy Records  metoprolol  tartrate (LOPRESSOR ) 25 MG tablet [id number] No Take 25 mg by mouth 2 (two) times daily. [provider] Past Week Active Self, Pharmacy Records  Nebulizers (COMPRESSOR/NEBULIZER) MISC [id number]  1 each by Does not apply route as directed. Jodeane Mulligan, DO  Active   nitroGLYCERIN  (NITROSTAT ) 0.4 MG SL tablet [id number] No Place 1 tablet (0.4 mg total) under the tongue every 5 (five) minutes as needed.  Patient taking differently: Place 0.4 mg under the tongue every 5 (five) minutes as needed for chest pain.   End, Veryl Gottron, MD Unknown Active Self, Pharmacy Records           Med Note [facility], MELISSA A   Fri Nov 29, 2023  5:44 PM)    ondansetron  (ZOFRAN -ODT) 8 MG disintegrating tablet [id number] No Take 1 tablet (8 mg total) by mouth every 8 (eight) hours as needed for nausea or vomiting. Aileen Alexanders, NP Unknown Active Self, Pharmacy Records           Med Note [facility], MELISSA A   Fri Nov 29, 2023  5:45 PM)    pantoprazole  (PROTONIX ) 40 MG tablet [id number] No Take 1 tablet (40 mg total) by mouth daily. End, Veryl Gottron, MD Past Week Active Self, Pharmacy Records  pramipexole  (MIRAPEX ) 1 MG tablet [id number] No Take 1 tablet (1 mg total) by mouth at bedtime. Aileen Alexanders, NP Past Week Active Self, Pharmacy Records  QUEtiapine  (SEROQUEL ) 300 MG tablet [id number] No Take 1 tablet (300 mg total) by mouth at bedtime. Verneda Golder, PA-C Past Week Active Self, Pharmacy Records  rOPINIRole  (REQUIP ) 1 MG tablet [id number] No Take 1 tablet (1 mg total) by mouth 2 (two) times daily. Aileen Alexanders, NP Past Week Active Self, Pharmacy Records  rosuvastatin  (CRESTOR ) 5 MG tablet [id number] No TAKE 1 TABLET DAILY Aileen Alexanders, NP Past Week  Active Self, Pharmacy Records  sertraline  (ZOLOFT ) 100 MG tablet [id number] No Take 1 tablet (100 mg total) by mouth daily. Verneda Golder, PA-C Past Week Active Self, Pharmacy Records  tirzepatide  (MOUNJARO ) 5 MG/0.5ML Pen [id number] No Inject 5 mg into the skin once a week. Hadassah Letters, MD Past Week Active Self, Pharmacy Records  ursodiol (ACTIGALL) 300 MG capsule [id number] No Take 300 mg by mouth 2 (two) times daily. Take 2 capsules in the AM and 1 capsule in the PM [provider] Past Week Active Self, Pharmacy Records            Home Care and Equipment/Supplies: Were Home Health Services Ordered?: NA Any new equipment or medical supplies ordered?: NA  Functional Questionnaire: Do you need assistance with bathing/showering or dressing?: No Do you need assistance with meal preparation?: No Do you need assistance with eating?: No Do you have difficulty maintaining continence: No Do you need assistance with getting out of bed/getting out of a chair/moving?: No Do you have difficulty managing or taking your medications?: No  Follow up appointments reviewed: PCP Follow-up appointment confirmed?: Yes Date of PCP follow-up appointment?:  12/05/23 Follow-up Provider: [facility] Follow-up appointment confirmed?: NA Do you need transportation to your follow-up appointment?: No Do you understand care options if your condition(s) worsen?: Yes-patient verbalized understanding    SIGNATURE Darrall Ellison, LPN [facility] Nurse Health Advisor Direct Dial [phone]

## 2023-12-02 NOTE — Telephone Encounter (Signed)
  Chief Complaint: Shortness of breath, fever, Pneumonia follow up Symptoms: SOB, Fever Frequency: n/a Pertinent Negatives: Patient denies n/a Disposition: [x] ED /[] Urgent Care (no appt availability in office) / [] Appointment(In office/virtual)/ []  [facility]/ [] Home Care/ [] Refused Recommended Disposition /[] [facility] [facility]/ []  Follow-up with PCP Additional Notes: Patient called in with shortness of breath (inability to speak in full sentences, speaking in phrases) and stating her temperature is going back up (currently 99.9). Patient was discharged yesterday from the hospital for pneumonia. This RN advised patient return to ED asap due to audible shortness of breath and breathing difficulty. Patient states she has another adult who can drive her now.    Reason for Disposition  MODERATE difficulty breathing (e.g., speaks in phrases, SOB even at rest, pulse 100-120)  Answer Assessment - Initial Assessment Questions 1. SYMPTOM: "What's the main symptom you're concerned about?" (e.g., breathing difficulty, fever, weakness)     Shortness of breath, fever 4. BREATHING DIFFICULTY: "Are you having any difficulty breathing?" If Yes, ask: "How bad is it?"  (e.g., none, mild, moderate, severe)   - MILD: No SOB at rest, mild SOB with walking, speaks normally in sentences, can lie down, no retractions, pulse < 100.    - MODERATE: SOB at rest, SOB with minimal exertion and prefers to sit, cannot lie down flat, speaks in phrases, mild retractions, audible wheezing, pulse 100-120.    - SEVERE: Very SOB at rest, speaks in single words, struggling to breathe, sitting hunched forward, retractions, pulse > 120      Patient speaking in phrases 5. FEVER: "Do you have a fever?" If Yes, ask: "What is your temperature, how was it measured, and when did it start?"     99.9 6. SPUTUM: "Describe the color of your sputum" (clear, white, yellow, green, blood-tinged)     No 7. DIAGNOSIS CONFIRMATION:  "When was the pneumonia diagnosed?" "By whom?"     11/29/23 8. ANTIBIOTIC: "Are you taking an antibiotic?"  If Yes, ask: "Which one?" "When was it started?"     Doxycycline  9. OTHER TREATMENT: "Are you receiving any other treatment for the pneumonia?" (e.g., albuterol  nebulizer, oxygen) If Yes, ask: "How often?" and "Does it help?"     Nebulizer  10. HOSPITAL ADMISSION: "Were you hospitalized for this pneumonia?" If Yes, ask: "When were you discharged home from the hospital?"       Yes from 05/02-05/04 11. O2 SATURATION MONITOR:  "Do you use an oxygen saturation monitor (pulse oximeter) at home?" If Yes, "What is your reading (oxygen level) today?" "What is your usual oxygen saturation reading?" (e.g., 95%)       N/a  Protocols used: Pneumonia Follow-up Call-A-[facility]

## 2023-12-03 ENCOUNTER — Ambulatory Visit

## 2023-12-03 NOTE — Telephone Encounter (Signed)
 Called and followed up with patient. States she is feeling a little bit better today and is not as short of breath as she has been. Confirmed follow up appointment on 12/05/23 with us .

## 2023-12-04 LAB — CULTURE, BLOOD (ROUTINE X 2)
Culture: NO GROWTH
Culture: NO GROWTH
Special Requests: ADEQUATE
Special Requests: ADEQUATE

## 2023-12-05 ENCOUNTER — Ambulatory Visit: Admitting: Nurse Practitioner

## 2023-12-05 ENCOUNTER — Encounter: Payer: Self-pay | Admitting: Nurse Practitioner

## 2023-12-05 ENCOUNTER — Encounter: Payer: Self-pay | Admitting: Physician Assistant

## 2023-12-05 ENCOUNTER — Telehealth (INDEPENDENT_AMBULATORY_CARE_PROVIDER_SITE_OTHER): Admitting: Physician Assistant

## 2023-12-05 VITALS — BP 106/68 | HR 80 | Ht 60.0 in | Wt 109.4 lb

## 2023-12-05 DIAGNOSIS — G4700 Insomnia, unspecified: Secondary | ICD-10-CM

## 2023-12-05 DIAGNOSIS — G2581 Restless legs syndrome: Secondary | ICD-10-CM | POA: Diagnosis not present

## 2023-12-05 DIAGNOSIS — E1165 Type 2 diabetes mellitus with hyperglycemia: Secondary | ICD-10-CM

## 2023-12-05 DIAGNOSIS — Z09 Encounter for follow-up examination after completed treatment for conditions other than malignant neoplasm: Secondary | ICD-10-CM

## 2023-12-05 DIAGNOSIS — Z7985 Long-term (current) use of injectable non-insulin antidiabetic drugs: Secondary | ICD-10-CM

## 2023-12-05 DIAGNOSIS — J189 Pneumonia, unspecified organism: Secondary | ICD-10-CM

## 2023-12-05 DIAGNOSIS — R748 Abnormal levels of other serum enzymes: Secondary | ICD-10-CM | POA: Diagnosis not present

## 2023-12-05 DIAGNOSIS — F411 Generalized anxiety disorder: Secondary | ICD-10-CM | POA: Diagnosis not present

## 2023-12-05 DIAGNOSIS — Z9289 Personal history of other medical treatment: Secondary | ICD-10-CM

## 2023-12-05 DIAGNOSIS — F319 Bipolar disorder, unspecified: Secondary | ICD-10-CM | POA: Diagnosis not present

## 2023-12-05 NOTE — Progress Notes (Addendum)
 BP 106/68 (BP Location: Left Arm, Patient Position: Sitting, Cuff Size: Small)   Pulse 80   Ht 5' (1.524 m)   Wt 109 lb 6.4 oz (49.6 kg)   LMP  (LMP Unknown)   SpO2 100%   BMI 21.37 kg/m    Subjective:    Patient ID: Amy Hansen, female    DOB: 02-Dec-1955, 68 y.o.   MRN: [id number]  HPI: Amy Hansen is a 68 y.o. female  Chief Complaint  Patient presents with   Hospitalization Follow-up    Admitted with pneumonia     Shortness of Breath   Transition of Care Hospital Follow up.   Hospital/Facility: [facility] D/C Physician: Dr. Macarthur Savory D/C Date: 11/28/23 and 12/01/23  Records Requested: NA Records Received: NA Records Reviewed: Yes  Diagnoses on Discharge:   Concern for sepsis- resolved - Initial tachycardia, tachypnea, lactate 2.0, give concern for sepsis.  Was given initial IV fluid bolus, blood cultures, empiric antibiotics.  Monitor blood pressure closely.  Resolved   Acute hypoxic respiratory failure- resolved - Noted worsening dyspnea , required 2l maintain O2 sats greater than 90%..  Was able to be weaned back to room air.  Hypoxia resolved.   Possible acute exacerbation of chronic HFpEF - Bilateral congestion on x-ray could be interpreted as worsening pulmonary edema.  Mildly elevated BNP, noted basilar crackles bilaterally.  Noted noncompliance with cardiac regimen as well.  Slightly worsening hypoxia.  Continue metoprolol  tartrate 25 mg twice daily.  Responded well to IV Lasix  60 mg every 12 hours.  Will transition patient to p.o. furosemide  40 mg twice daily for the next 3 days then transition back to patient's normal furosemide  20 mg as needed.  Continue other cardiac home medication regiment.   Multifocal pneumonia (POA)- complete course of doxycycline . Patient is feeling better.  Does have some coughing.  No chest pain.  Does get winded doing things at home. - Initiated on antibiotic coverage at prior admission, discharged 5/1 on p.o. Augmentin  plus  azithromycin .  Chest x-ray personally reviewed looks very similar to previous admission.  No leukocytosis, purulent sputum.  Rapid COVID/flu/RSV negative.  Changed antibiotics to p.o. doxycycline .  Schedule DuoNeb every 6 hours, albuterol  nebulizers as needed.  No active wheezing appreciated, will hold off on IV Solu-Medrol .  Will discharge patient with prescription for p.o. doxycycline  to take for the next 5 days.  Will give prescription for home nebulizer and albuterol  nebs as patient feels improved efficacy with the nebulizer.   Pleuritic chest pain- resolved - Patient with sharp stabbing type pain exacerbated by deep inhalation and coughing.  Likely pleuritic in nature secondary to previous pneumonia.  Resolved after IV Toradol  15 mg.   Dysphagia with dehydration- improved - Patient reported decreased p.o. intake as well as a inability to take her home medication regiment for over a week.  Admits to pills feeling stuck in her esophagus.  Suspect mild effective ketosis/dehydration.  Received IV fluid bolus.  Evaluated by speech/swallow.  Recommending outpatient follow-up with GI.  Patient counseled on good practices, slow rate, small bites, elevated head of bed during and after intake.   Mild hypokalemia- rechecked at visit today - 3.0 on presentation.  Resolved after replenishment.   Diabetes mellitus, uncontrolled- Likely elevated due to steroid use. Continue with Mounjaro .  Stop insulin .  - A1c 9.8 suggesting very poor control.  Patient admits that her stress levels very high and her blood sugars have been running higher than normal.    Anxiety -  Likely contributing substantially to her symptoms.  Restarted patient's anxiety medications.   Hypertension - Metoprolol  started as noted above.  Will await volume resuscitation before restarting other home medication regimen.   GERD - Resume Protonix .  May be contributing to patient's dysphagia.  Patient to follow-up with GI in the outpatient  setting.   Restless leg syndrome - Pramipexole  1 mg at night.      Date of interactive Contact within 48 hours of discharge:  Contact was through: phone  Date of 7 day or 14 day face-to-face visit:    within 7 days  Outpatient Encounter Medications as of 12/05/2023  Medication Sig Note   ALPRAZolam  (XANAX ) 0.5 MG tablet Take 1 tablet (0.5 mg total) by mouth 2 (two) times daily as needed for anxiety. Must last 15 days.    aspirin  EC 81 MG tablet Take 1 tablet (81 mg) by mouth once daily. Swallow whole.    clopidogrel  (PLAVIX ) 75 MG tablet Take 1 tablet (75 mg total) by mouth daily with breakfast.    estradiol  (ESTRACE ) 0.1 MG/GM vaginal cream Apply one pea-sized amount around the opening of the urethra 3 times weekly. (Patient taking differently: Place 1 Applicatorful vaginally 3 (three) times a week. Apply one pea-sized amount around the opening of the urethra 3 times weekly.)    furosemide  (LASIX ) 20 MG tablet Take 1 tablet (20 mg total) by mouth daily as needed (for swelling in the legs accompanied by a weight gain of 3 pounds overnight -- USE SPARINGLY).    isosorbide  mononitrate (IMDUR ) 30 MG 24 hr tablet Take 0.5 tablets (15 mg total) by mouth daily.    metoprolol  tartrate (LOPRESSOR ) 25 MG tablet Take 25 mg by mouth 2 (two) times daily.    Nebulizers (COMPRESSOR/NEBULIZER) MISC 1 each by Does not apply route as directed.    ondansetron  (ZOFRAN -ODT) 8 MG disintegrating tablet Take 1 tablet (8 mg total) by mouth every 8 (eight) hours as needed for nausea or vomiting.    pantoprazole  (PROTONIX ) 40 MG tablet Take 1 tablet (40 mg total) by mouth daily.    pramipexole  (MIRAPEX ) 1 MG tablet Take 1 tablet (1 mg total) by mouth at bedtime.    QUEtiapine  (SEROQUEL ) 300 MG tablet Take 1 tablet (300 mg total) by mouth at bedtime.    rOPINIRole  (REQUIP ) 1 MG tablet Take 1 tablet (1 mg total) by mouth 2 (two) times daily.    rosuvastatin  (CRESTOR ) 5 MG tablet TAKE 1 TABLET DAILY    sertraline   (ZOLOFT ) 100 MG tablet Take 1 tablet (100 mg total) by mouth daily.    ursodiol (ACTIGALL) 300 MG capsule Take 300 mg by mouth 2 (two) times daily. Take 2 capsules in the AM and 1 capsule in the PM    [DISCONTINUED] albuterol  (PROVENTIL ) (2.5 MG/3ML) 0.083% nebulizer solution Take 3 mLs (2.5 mg total) by nebulization every 6 (six) hours as needed for wheezing or shortness of breath.    [DISCONTINUED] albuterol  (VENTOLIN  HFA) 108 (90 Base) MCG/ACT inhaler Inhale 2 puffs into the lungs every 6 (six) hours as needed for wheezing or shortness of breath.    [DISCONTINUED] doxycycline  (VIBRAMYCIN ) 100 MG capsule Take 100 mg by mouth 2 (two) times daily.    [DISCONTINUED] insulin  aspart (NOVOLOG ) 100 UNIT/ML injection Inject 0-6 Units into the skin as directed. Only used as needed, sliding scale    [DISCONTINUED] insulin  glargine (LANTUS ) 100 UNIT/ML injection Inject 1-15 Units into the skin as directed. Sliding scale as needed    [  DISCONTINUED] tirzepatide  (MOUNJARO ) 5 MG/0.5ML Pen Inject 5 mg into the skin once a week.    nitroGLYCERIN  (NITROSTAT ) 0.4 MG SL tablet Place 1 tablet (0.4 mg total) under the tongue every 5 (five) minutes as needed. (Patient not taking: Reported on 12/20/2023) 12/20/2023: On hand   [DISCONTINUED] doxycycline  (VIBRA -TABS) 100 MG tablet Take 1 tablet (100 mg total) by mouth every 12 (twelve) hours. (Patient not taking: Reported on 12/05/2023)    [DISCONTINUED] furosemide  (LASIX ) 40 MG tablet Take 1 tablet (40 mg total) by mouth 2 (two) times daily for 2 days.    [DISCONTINUED] guaiFENesin  (ROBITUSSIN) 100 MG/5ML liquid Take 5 mLs by mouth every 6 (six) hours as needed for to loosen phlegm. (Patient taking differently: Take 5 mLs by mouth every 6 (six) hours as needed for to loosen phlegm or cough.)    No facility-administered encounter medications on file as of 12/05/2023.    Diagnostic Tests Reviewed/Disposition: Reviewed  Consults: Reviewed  Discharge Instructions: Reviewed with  patient during visit  Disease/illness Education: Provided during visit  Home Health/Community Services Discussions/Referrals: NA  Establishment or re-establishment of referral orders for community resources: NA  Discussion with other health care providers: NA  Assessment and Support of treatment regimen adherence: Discussed during visit  Appointments Coordinated with: Patient  Education for self-management, independent living, and ADLs: Discussed during visit  Relevant past medical, surgical, family and social history reviewed and updated as indicated. Interim medical history since our last visit reviewed. Allergies and medications reviewed and updated.  Review of Systems  Respiratory:  Positive for cough and shortness of breath.   Cardiovascular:  Negative for chest pain.    Per HPI unless specifically indicated above     Objective:     BP 106/68 (BP Location: Left Arm, Patient Position: Sitting, Cuff Size: Small)   Pulse 80   Ht 5' (1.524 m)   Wt 109 lb 6.4 oz (49.6 kg)   LMP  (LMP Unknown)   SpO2 100%   BMI 21.37 kg/m   Wt Readings from Last 3 Encounters:  12/20/23 106 lb 4 oz (48.2 kg)  12/11/23 104 lb (47.2 kg)  12/06/23 108 lb (49 kg)    Physical Exam Vitals and nursing note reviewed.  Constitutional:      General: She is not in acute distress.    Appearance: Normal appearance. She is normal weight. She is not ill-appearing, toxic-appearing or diaphoretic.  HENT:     Head: Normocephalic.     Right Ear: External ear normal.     Left Ear: External ear normal.     Nose: Nose normal.     Mouth/Throat:     Mouth: Mucous membranes are moist.     Pharynx: Oropharynx is clear.  Eyes:     General:        Right eye: No discharge.        Left eye: No discharge.     Extraocular Movements: Extraocular movements intact.     Conjunctiva/sclera: Conjunctivae normal.     Pupils: Pupils are equal, round, and reactive to light.  Cardiovascular:     Rate and Rhythm:  Normal rate and regular rhythm.     Heart sounds: No murmur heard. Pulmonary:     Effort: Pulmonary effort is normal. No respiratory distress.     Breath sounds: Normal breath sounds. No wheezing or rales.  Musculoskeletal:     Cervical back: Normal range of motion and neck supple.  Skin:    General: Skin is  warm and dry.     Capillary Refill: Capillary refill takes less than 2 seconds.  Neurological:     General: No focal deficit present.     Mental Status: She is alert and oriented to person, place, and time. Mental status is at baseline.  Psychiatric:        Mood and Affect: Mood normal.        Behavior: Behavior normal.        Thought Content: Thought content normal.        Judgment: Judgment normal.     Results for orders placed or performed during the hospital encounter of 11/29/23  Basic metabolic panel   Collection Time: 11/29/23 12:38 PM  Result Value Ref Range   Sodium 132 (L) 135 - 145 mmol/L   Potassium 3.0 (L) 3.5 - 5.1 mmol/L   Chloride 101 98 - 111 mmol/L   CO2 18 (L) 22 - 32 mmol/L   Glucose, Bld 234 (H) 70 - 99 mg/dL   BUN <5 (L) 8 - 23 mg/dL   Creatinine, Ser 2.95 0.44 - 1.00 mg/dL   Calcium  8.3 (L) 8.9 - 10.3 mg/dL   GFR, Estimated >62 >13 mL/min   Anion gap 13 5 - 15  CBC   Collection Time: 11/29/23 12:38 PM  Result Value Ref Range   WBC 7.0 4.0 - 10.5 K/uL   RBC 4.08 3.87 - 5.11 MIL/uL   Hemoglobin 10.5 (L) 12.0 - 15.0 g/dL   HCT 08.6 (L) 57.8 - 46.9 %   MCV 80.6 80.0 - 100.0 fL   MCH 25.7 (L) 26.0 - 34.0 pg   MCHC 31.9 30.0 - 36.0 g/dL   RDW 62.9 (H) 52.8 - 41.3 %   Platelets 501 (H) 150 - 400 K/uL   nRBC 0.0 0.0 - 0.2 %  Brain natriuretic peptide   Collection Time: 11/29/23 12:38 PM  Result Value Ref Range   B Natriuretic Peptide 141.2 (H) 0.0 - 100.0 pg/mL  Magnesium    Collection Time: 11/29/23 12:38 PM  Result Value Ref Range   Magnesium  1.7 1.7 - 2.4 mg/dL  Troponin I (High Sensitivity)   Collection Time: 11/29/23 12:38 PM  Result  Value Ref Range   Troponin I (High Sensitivity) 13 <18 ng/L  Resp panel by RT-PCR (RSV, Flu A&B, Covid) Anterior Nasal Swab   Collection Time: 11/29/23 12:39 PM   Specimen: Anterior Nasal Swab  Result Value Ref Range   SARS Coronavirus 2 by RT PCR NEGATIVE NEGATIVE   Influenza A by PCR NEGATIVE NEGATIVE   Influenza B by PCR NEGATIVE NEGATIVE   Resp Syncytial Virus by PCR NEGATIVE NEGATIVE  Culture, blood (routine x 2)   Collection Time: 11/29/23  3:25 PM   Specimen: BLOOD  Result Value Ref Range   Specimen Description BLOOD BLOOD LEFT ARM    Special Requests      BOTTLES DRAWN AEROBIC AND ANAEROBIC Blood Culture adequate volume   Culture      NO GROWTH 5 DAYS Performed at [facility], [address], [place], [place] [address]    Report Status 12/04/2023 FINAL   Lactic acid, plasma   Collection Time: 11/29/23  3:25 PM  Result Value Ref Range   Lactic Acid, Venous 2.0 (HH) 0.5 - 1.9 mmol/L  Lactic acid, plasma   Collection Time: 11/29/23  3:25 PM  Result Value Ref Range   Lactic Acid, Venous 1.9 0.5 - 1.9 mmol/L  Culture, blood (routine x 2)   Collection Time:  11/29/23  3:26 PM   Specimen: BLOOD  Result Value Ref Range   Specimen Description BLOOD BLOOD LEFT ARM    Special Requests      BOTTLES DRAWN AEROBIC AND ANAEROBIC Blood Culture adequate volume   Culture      NO GROWTH 5 DAYS Performed at [facility], [address] [place], [place] [address]    Report Status 12/04/2023 FINAL   Troponin I (High Sensitivity)   Collection Time: 11/29/23  3:26 PM  Result Value Ref Range   Troponin I (High Sensitivity) 12 <18 ng/L  CBG monitoring, ED   Collection Time: 11/29/23 10:43 PM  Result Value Ref Range   Glucose-Capillary 225 (H) 70 - 99 mg/dL  Basic metabolic panel   Collection Time: 11/30/23  5:40 AM  Result Value Ref Range   Sodium 135 135 - 145 mmol/L   Potassium 3.5 3.5 - 5.1 mmol/L   Chloride 101 98 - 111 mmol/L   CO2 24 22 - 32 mmol/L    Glucose, Bld 112 (H) 70 - 99 mg/dL   BUN <5 (L) 8 - 23 mg/dL   Creatinine, Ser 5.64 0.44 - 1.00 mg/dL   Calcium  8.0 (L) 8.9 - 10.3 mg/dL   GFR, Estimated >33 >29 mL/min   Anion gap 10 5 - 15  CBC   Collection Time: 11/30/23  5:40 AM  Result Value Ref Range   WBC 4.6 4.0 - 10.5 K/uL   RBC 3.46 (L) 3.87 - 5.11 MIL/uL   Hemoglobin 9.1 (L) 12.0 - 15.0 g/dL   HCT 51.8 (L) 84.1 - 66.0 %   MCV 80.1 80.0 - 100.0 fL   MCH 26.3 26.0 - 34.0 pg   MCHC 32.9 30.0 - 36.0 g/dL   RDW 63.0 (H) 16.0 - 10.9 %   Platelets 484 (H) 150 - 400 K/uL   nRBC 0.0 0.0 - 0.2 %  CBG monitoring, ED   Collection Time: 11/30/23  7:51 AM  Result Value Ref Range   Glucose-Capillary 139 (H) 70 - 99 mg/dL  CBG monitoring, ED   Collection Time: 11/30/23 11:47 AM  Result Value Ref Range   Glucose-Capillary 122 (H) 70 - 99 mg/dL  CBG monitoring, ED   Collection Time: 11/30/23  4:29 PM  Result Value Ref Range   Glucose-Capillary 227 (H) 70 - 99 mg/dL  Glucose, capillary   Collection Time: 11/30/23  9:11 PM  Result Value Ref Range   Glucose-Capillary 96 70 - 99 mg/dL  CBC   Collection Time: 12/01/23  5:17 AM  Result Value Ref Range   WBC 4.6 4.0 - 10.5 K/uL   RBC 4.27 3.87 - 5.11 MIL/uL   Hemoglobin 11.0 (L) 12.0 - 15.0 g/dL   HCT 32.3 (L) 55.7 - 32.2 %   MCV 78.9 (L) 80.0 - 100.0 fL   MCH 25.8 (L) 26.0 - 34.0 pg   MCHC 32.6 30.0 - 36.0 g/dL   RDW 02.5 (H) 42.7 - 06.2 %   Platelets 671 (H) 150 - 400 K/uL   nRBC 0.0 0.0 - 0.2 %  Basic metabolic panel with GFR   Collection Time: 12/01/23  5:17 AM  Result Value Ref Range   Sodium 135 135 - 145 mmol/L   Potassium 3.5 3.5 - 5.1 mmol/L   Chloride 99 98 - 111 mmol/L   CO2 24 22 - 32 mmol/L   Glucose, Bld 100 (H) 70 - 99 mg/dL   BUN 10 8 - 23 mg/dL   Creatinine,  Ser 0.67 0.44 - 1.00 mg/dL   Calcium  8.5 (L) 8.9 - 10.3 mg/dL   GFR, Estimated >16 >10 mL/min   Anion gap 12 5 - 15  Magnesium    Collection Time: 12/01/23  5:17 AM  Result Value Ref Range    Magnesium  1.9 1.7 - 2.4 mg/dL  Glucose, capillary   Collection Time: 12/01/23  8:13 AM  Result Value Ref Range   Glucose-Capillary 137 (H) 70 - 99 mg/dL  Glucose, capillary   Collection Time: 12/01/23 11:57 AM  Result Value Ref Range   Glucose-Capillary 63 (L) 70 - 99 mg/dL   *Note: Due to a large number of results and/or encounters for the requested time period, some results have not been displayed. A complete set of results can be found in Results Review.      Assessment & Plan:   Problem List Items Addressed This Visit       Respiratory   Multifocal pneumonia   Complete course of doxycyline.          Endocrine   Uncontrolled type 2 diabetes mellitus with hyperglycemia, without long-term current use of insulin  (HCC)   Continue with Mounjaro .  Follow up in 1 month for repeat A1c.  Suspect it is elevated due to recent steroid use.      Other Visit Diagnoses       Hospital discharge follow-up    -  Primary   CMP and CBC checked at visit today.  Compelte course of doxycyline.   Relevant Orders   Comp Met (CMET)   CBC w/Diff     Elevated liver enzymes       Following up with Hepatology at [facility]. On [facility].  Conitnue to follow up with specialist.  Notes reviewed from visit.        Follow up plan: No follow-ups on file.

## 2023-12-05 NOTE — Assessment & Plan Note (Signed)
 Complete course of doxycyline.

## 2023-12-05 NOTE — Assessment & Plan Note (Signed)
 Continue with Mounjaro .  Follow up in 1 month for repeat A1c.  Suspect it is elevated due to recent steroid use.

## 2023-12-05 NOTE — Progress Notes (Signed)
 [facility]  Patient ID: Amy Hansen,  MRN: [id number]  PCP: Aileen Alexanders, NP  Date of Evaluation: 12/05/2023 Time spent:20 minutes  Chief Complaint:  Chief Complaint   Anxiety; Depression   Virtual Visit via Telehealth  I connected with patient by a video enabled telemedicine application  with their informed consent, and verified patient privacy and that I am speaking with the correct person using two identifiers.  I am private, in my office and the patient is at home.  I discussed the limitations, risks, security and privacy concerns of performing an evaluation and management service by  video and the availability of in person appointments. I also discussed with the patient that there may be a patient responsible charge related to this service. The patient expressed understanding and agreed to proceed.   I discussed the assessment and treatment plan with the patient. The patient was provided an opportunity to ask questions and all were answered. The patient agreed with the plan and demonstrated an understanding of the instructions.   The patient was advised to call back or seek an in-person evaluation if the symptoms worsen or if the condition fails to improve as anticipated.  I provided 20  minutes of non-face-to-face time during this encounter.  HISTORY/CURRENT STATUS: For routine med check.   Was hospitalized for pneumonia a few weeks ago.  She is home now and is getting better every day.  Up until this physical illness she was doing well with her mental health.  She had been enjoying spending time with her grandkids.  Energy and motivation are fair to good, depending on the day.  No extreme sadness, tearfulness, or feelings of hopelessness.  Sleeps well.  ADLs and personal hygiene are normal.   Denies any changes in memory.  Appetite has not changed.  Weight is stable.  Anxiety is controlled w/ current meds.  Denies suicidal or homicidal thoughts.  Patient  denies increased energy with decreased need for sleep, increased talkativeness, racing thoughts, impulsivity or risky behaviors, increased spending, increased libido, grandiosity, increased irritability or anger, paranoia, or hallucinations.  Review of Systems  Constitutional:  Positive for malaise/fatigue.  HENT: Negative.    Eyes: Negative.   Respiratory:         Recent pneumonia requiring hospitalization.  Cardiovascular: Negative.   Gastrointestinal: Negative.   Genitourinary: Negative.   Musculoskeletal: Negative.   Skin: Negative.   Neurological: Negative.   Endo/Heme/Allergies: Negative.   Psychiatric/Behavioral:         See HPI   Individual Medical History/ Review of Systems: Changes? :Yes   see chart.  Was hospitalized for pneumonia on 11/29/2023.  Past medications for mental health diagnoses include: Trazodone , Risperdal , Zoloft , Lunesta, prazosin, Sonata, Prozac, Depakote, Lamictal , lithium, Wellbutrin , Xanax , Ambien , carbamazepine , Seroquel , Buspar  caused falls and hallucinations, Gabapentin -she doesn't want to take  Allergies: Avelox [moxifloxacin hcl in nacl], Bactrim  [sulfamethoxazole -trimethoprim ], Ciprofloxacin , Buspar  [buspirone ], Neomycin -polymyxin-dexameth, Aquaphor [lanolin-petrolatum], Depakote [divalproex sodium], Imitrex  [sumatriptan ], and Stadol [butorphanol]  Current Medications:  Current Outpatient Medications:    albuterol  (PROVENTIL ) (2.5 MG/3ML) 0.083% nebulizer solution, Take 3 mLs (2.5 mg total) by nebulization every 6 (six) hours as needed for wheezing or shortness of breath., Disp: 75 mL, Rfl: 3   albuterol  (VENTOLIN  HFA) 108 (90 Base) MCG/ACT inhaler, Inhale 2 puffs into the lungs every 6 (six) hours as needed for wheezing or shortness of breath., Disp: 8 g, Rfl: 2   ALPRAZolam  (XANAX ) 0.5 MG tablet, Take 1 tablet (0.5 mg total) by mouth 2 (  two) times daily as needed for anxiety. Must last 15 days., Disp: 30 tablet, Rfl: 5   aspirin  EC 81 MG tablet, Take  1 tablet (81 mg) by mouth once daily. Swallow whole., Disp: , Rfl:    clopidogrel  (PLAVIX ) 75 MG tablet, Take 1 tablet (75 mg total) by mouth daily with breakfast., Disp: 90 tablet, Rfl: 3   doxycycline  (VIBRA -TABS) 100 MG tablet, Take 1 tablet (100 mg total) by mouth every 12 (twelve) hours., Disp: 10 tablet, Rfl: 0   doxycycline  (VIBRAMYCIN ) 100 MG capsule, Take 100 mg by mouth 2 (two) times daily., Disp: , Rfl:    estradiol  (ESTRACE ) 0.1 MG/GM vaginal cream, Apply one pea-sized amount around the opening of the urethra 3 times weekly. (Patient taking differently: Place 1 Applicatorful vaginally 3 (three) times a week. Apply one pea-sized amount around the opening of the urethra 3 times weekly.), Disp: 42.5 g, Rfl: 12   furosemide  (LASIX ) 20 MG tablet, Take 1 tablet (20 mg total) by mouth daily as needed (for swelling in the legs accompanied by a weight gain of 3 pounds overnight -- USE SPARINGLY)., Disp: 45 tablet, Rfl: 1   guaiFENesin  (ROBITUSSIN) 100 MG/5ML liquid, Take 5 mLs by mouth every 6 (six) hours as needed for to loosen phlegm. (Patient taking differently: Take 5 mLs by mouth every 6 (six) hours as needed for to loosen phlegm or cough.), Disp: 120 mL, Rfl: 0   insulin  aspart (NOVOLOG ) 100 UNIT/ML injection, Inject 0-6 Units into the skin as directed. Only used as needed, sliding scale, Disp: , Rfl:    insulin  glargine (LANTUS ) 100 UNIT/ML injection, Inject 1-15 Units into the skin as directed. Sliding scale as needed, Disp: , Rfl:    metoprolol  tartrate (LOPRESSOR ) 25 MG tablet, Take 25 mg by mouth 2 (two) times daily., Disp: , Rfl:    Nebulizers (COMPRESSOR/NEBULIZER) MISC, 1 each by Does not apply route as directed., Disp: 1 each, Rfl: 0   ondansetron  (ZOFRAN -ODT) 8 MG disintegrating tablet, Take 1 tablet (8 mg total) by mouth every 8 (eight) hours as needed for nausea or vomiting., Disp: 60 tablet, Rfl: 1   pantoprazole  (PROTONIX ) 40 MG tablet, Take 1 tablet (40 mg total) by mouth daily.,  Disp: 90 tablet, Rfl: 3   pramipexole  (MIRAPEX ) 1 MG tablet, Take 1 tablet (1 mg total) by mouth at bedtime., Disp: 90 tablet, Rfl: 0   QUEtiapine  (SEROQUEL ) 300 MG tablet, Take 1 tablet (300 mg total) by mouth at bedtime., Disp: 30 tablet, Rfl: 5   rOPINIRole  (REQUIP ) 1 MG tablet, Take 1 tablet (1 mg total) by mouth 2 (two) times daily., Disp: 180 tablet, Rfl: 1   rosuvastatin  (CRESTOR ) 5 MG tablet, TAKE 1 TABLET DAILY, Disp: 90 tablet, Rfl: 1   sertraline  (ZOLOFT ) 100 MG tablet, Take 1 tablet (100 mg total) by mouth daily., Disp: 30 tablet, Rfl: 5   tirzepatide  (MOUNJARO ) 5 MG/0.5ML Pen, Inject 5 mg into the skin once a week., Disp: 6 mL, Rfl: 0   ursodiol (ACTIGALL) 300 MG capsule, Take 300 mg by mouth 2 (two) times daily. Take 2 capsules in the AM and 1 capsule in the PM, Disp: , Rfl:    furosemide  (LASIX ) 40 MG tablet, Take 1 tablet (40 mg total) by mouth 2 (two) times daily for 2 days., Disp: 6 tablet, Rfl: 0   isosorbide  mononitrate (IMDUR ) 30 MG 24 hr tablet, Take 0.5 tablets (15 mg total) by mouth daily., Disp: 45 tablet, Rfl: 3   nitroGLYCERIN  (NITROSTAT )  0.4 MG SL tablet, Place 1 tablet (0.4 mg total) under the tongue every 5 (five) minutes as needed. (Patient not taking: Reported on 12/05/2023), Disp: 25 tablet, Rfl: 3 Medication Side Effects: none  Family Medical/ Social History: Changes?  no  MENTAL HEALTH EXAM:  There were no vitals taken for this visit.There is no height or weight on file to calculate BMI.  General Appearance: Casual and Well Groomed  Eye Contact:  Good  Speech:  Clear and Coherent and Normal Rate  Volume:  Normal  Mood:  Euthymic  Affect:  Congruent  Thought Process:  Goal Directed and Descriptions of Associations: Circumstantial  Orientation:  Full (Time, Place, and Person)  Thought Content: Logical   Suicidal Thoughts:  No  Homicidal Thoughts:  No  Memory:  at her baseline  Judgement:  Good  Insight:  Good  Psychomotor Activity:  Normal   Concentration:  Concentration: Good and Attention Span: Good  Recall:  Good  Fund of Knowledge: Good  Language: Good  Assets:  Communication Skills Desire for Improvement Financial Resources/Insurance Housing Resilience Transportation   ADL's:  Intact  Cognition: WNL  Prognosis:  Good   PCP follows labs  DIAGNOSES:    ICD-10-CM   1. Generalized anxiety disorder  F41.1     2. Bipolar I disorder (HCC)  F31.9     3. Insomnia, unspecified type  G47.00     4. Restless legs syndrome (RLS)  G25.81     5. History of recent hospitalization  Z92.89      Receiving Psychotherapy: No   RECOMMENDATIONS:  PDMP reviewed.  Xanax  filled 11/23/2023. I provided 20 minutes of non-face-to-face time during this encounter, including time spent before and after the visit in records review, medical decision making, counseling pertinent to today's visit, and charting.   As far as the mental health is concerned she is doing well so no changes will be made. I wished her well with a speedy recovery from the pneumonia.  Continue Xanax  0.5 mg, 1 p.o. twice daily as needed.  DON'T FILL EARLY. EVER.   Continue pramipexole  1 mg, 1 p.o. nightly. Continue ropinirole  1 mg, 1 p.o. twice daily. Continue Zoloft  100 mg p.o. daily. Continue trazodone  50 mg, up to 2 p.o. nightly as needed sleep. Return in 3 months.  Marvia Slocumb, PA-C

## 2023-12-06 ENCOUNTER — Other Ambulatory Visit: Payer: Self-pay

## 2023-12-06 ENCOUNTER — Emergency Department
Admission: EM | Admit: 2023-12-06 | Discharge: 2023-12-07 | Disposition: A | Discharge status: Home / Self Care | Attending: Emergency Medicine | Admitting: Emergency Medicine

## 2023-12-06 DIAGNOSIS — R918 Other nonspecific abnormal finding of lung field: Secondary | ICD-10-CM | POA: Insufficient documentation

## 2023-12-06 DIAGNOSIS — R1032 Left lower quadrant pain: Secondary | ICD-10-CM | POA: Insufficient documentation

## 2023-12-06 DIAGNOSIS — N200 Calculus of kidney: Secondary | ICD-10-CM | POA: Diagnosis not present

## 2023-12-06 DIAGNOSIS — K5730 Diverticulosis of large intestine without perforation or abscess without bleeding: Secondary | ICD-10-CM | POA: Diagnosis not present

## 2023-12-06 DIAGNOSIS — R109 Unspecified abdominal pain: Secondary | ICD-10-CM | POA: Diagnosis not present

## 2023-12-06 DIAGNOSIS — R110 Nausea: Secondary | ICD-10-CM | POA: Diagnosis not present

## 2023-12-06 DIAGNOSIS — R1030 Lower abdominal pain, unspecified: Secondary | ICD-10-CM | POA: Diagnosis not present

## 2023-12-06 DIAGNOSIS — K449 Diaphragmatic hernia without obstruction or gangrene: Secondary | ICD-10-CM | POA: Diagnosis not present

## 2023-12-06 LAB — COMPREHENSIVE METABOLIC PANEL WITH GFR
ALT: 28 U/L (ref 0–44)
AST: 41 U/L (ref 15–41)
Albumin: 2.4 g/dL — ABNORMAL LOW (ref 3.5–5.0)
Alkaline Phosphatase: 145 U/L — ABNORMAL HIGH (ref 38–126)
Anion gap: 9 (ref 5–15)
BUN: 13 mg/dL (ref 8–23)
CO2: 22 mmol/L (ref 22–32)
Calcium: 7.9 mg/dL — ABNORMAL LOW (ref 8.9–10.3)
Chloride: 103 mmol/L (ref 98–111)
Creatinine, Ser: 0.91 mg/dL (ref 0.44–1.00)
GFR, Estimated: >60 mL/min (ref >60)
Glucose, Bld: 185 mg/dL — ABNORMAL HIGH (ref 70–99)
Potassium: 4.1 mmol/L (ref 3.5–5.1)
Sodium: 134 mmol/L — ABNORMAL LOW (ref 135–145)
Total Bilirubin: 0.6 mg/dL (ref 0.0–1.2)
Total Protein: 6.3 g/dL — ABNORMAL LOW (ref 6.5–8.1)

## 2023-12-06 LAB — URINALYSIS, ROUTINE W REFLEX MICROSCOPIC
Bilirubin Urine: NEGATIVE
Glucose, UA: NEGATIVE mg/dL
Ketones, ur: NEGATIVE mg/dL
Nitrite: NEGATIVE
Protein, ur: NEGATIVE mg/dL
RBC / HPF: >50 RBC/hpf (ref 0–5)
Specific Gravity, Urine: 1.019 (ref 1.005–1.030)
pH: 6 (ref 5.0–8.0)

## 2023-12-06 LAB — CBC
HCT: 33.4 % — ABNORMAL LOW (ref 36.0–46.0)
Hemoglobin: 10.5 g/dL — ABNORMAL LOW (ref 12.0–15.0)
MCH: 25.7 pg — ABNORMAL LOW (ref 26.0–34.0)
MCHC: 31.4 g/dL (ref 30.0–36.0)
MCV: 81.7 fL (ref 80.0–100.0)
Platelets: 733 10*3/uL — ABNORMAL HIGH (ref 150–400)
RBC: 4.09 MIL/uL (ref 3.87–5.11)
RDW: 15.5 % (ref 11.5–15.5)
WBC: 3.8 10*3/uL — ABNORMAL LOW (ref 4.0–10.5)
nRBC: 0 % (ref 0.0–0.2)

## 2023-12-06 LAB — LIPASE, BLOOD: Lipase: 38 U/L (ref 11–51)

## 2023-12-06 NOTE — ED Triage Notes (Signed)
 Pt from home via EMS for c/o lower abdominal about 3 hours ago that improved slightly after BM. Pt c/o nausea, no diarrhea or emesis. Pt diabetic. Pt reports eating shrimp today. Pt is currently being treated for pneumonia.  192- bgl Gallbladder  98.1 125/85

## 2023-12-06 NOTE — ED Provider Notes (Signed)
 [facility] Provider Note    Event Date/Time   First MD Initiated Contact with Patient 12/06/23 2257     (approximate)   History   Abdominal Pain   HPI Amy Hansen is a 68 y.o. female recently admitted for acute hypoxic respiratory failure due to multifocal pneumonia, has been discharged for 4 to 5 days.  She presents for acute onset of severe generalized lower abdominal pain.  She said it was the worst pain of her life and felt like Jaggers in her abdomen.  She feels that it lasted about 45 minutes and it almost completely resolved after having a bowel movement.  Her abdomen still feels painful if she moves around too quickly but it is much better than it was before.  The pain was accompanied with nausea but no vomiting.  She said that her stools have been softer recently due to the antibiotics she has been taking for her pneumonia and she does not feel constipated.  She does not take any narcotic pain medication.  She has no fever, chest pain, nor shortness of breath at this time.  She has a history of kidney stones.  No dysuria.  She is still taking antibiotics for her pneumonia.     Physical Exam   Triage Vital Signs: ED Triage Vitals  Encounter Vitals Group     BP 12/06/23 1958 113/85     Systolic BP Percentile --      Diastolic BP Percentile --      Pulse Rate 12/06/23 1958 (!) 106     Resp 12/06/23 1958 17     Temp 12/06/23 1958 98.2 F (36.8 C)     Temp Source 12/06/23 1958 Oral     SpO2 12/06/23 1958 96 %     Weight 12/06/23 1959 49 kg (108 lb)     Height 12/06/23 1959 1.524 m (5')     Head Circumference --      Peak Flow --      Pain Score 12/06/23 1958 4     Pain Loc --      Pain Education --      Exclude from Growth Chart --     Most recent vital signs: Vitals:   12/07/23 0000 12/07/23 0110  BP: 122/83 (!) 140/89  Pulse: 88 96  Resp: 18 20  Temp:  98.1 F (36.7 C)  SpO2: 100% 100%    General: Awake, obvious distress,  appears somewhat chronically ill but nontoxic. CV:  Good peripheral perfusion.  Regular rate and rhythm. Resp:  Normal effort. Speaking easily and comfortably, no accessory muscle usage nor intercostal retractions.   Abd:  No distention.  Patient has some voluntary guarding but it is better with distraction.  Patient reports some tenderness to palpation all throughout the lower abdomen with no localized peritonitis.  Abdomen is soft.   ED Results / Procedures / Treatments   Labs (all labs ordered are listed, but only abnormal results are displayed) Labs Reviewed  COMPREHENSIVE METABOLIC PANEL WITH GFR - Abnormal; Notable for the following components:      Result Value   Sodium 134 (*)    Glucose, Bld 185 (*)    Calcium  7.9 (*)    Total Protein 6.3 (*)    Albumin 2.4 (*)    Alkaline Phosphatase 145 (*)    All other components within normal limits  CBC - Abnormal; Notable for the following components:   WBC 3.8 (*)  Hemoglobin 10.5 (*)    HCT 33.4 (*)    MCH 25.7 (*)    Platelets 733 (*)    All other components within normal limits  URINALYSIS, ROUTINE W REFLEX MICROSCOPIC - Abnormal; Notable for the following components:   Color, Urine YELLOW (*)    APPearance HAZY (*)    Hgb urine dipstick MODERATE (*)    Leukocytes,Ua SMALL (*)    Bacteria, UA RARE (*)    All other components within normal limits  LIPASE, BLOOD     RADIOLOGY See ED course for details regarding CT abdomen/pelvis   PROCEDURES:  Critical Care performed: No  Procedures    IMPRESSION / MDM / ASSESSMENT AND PLAN / ED COURSE  I reviewed the triage vital signs and the nursing notes.                              Differential diagnosis includes, but is not limited to, renal/ureteral colic, UTI/plan nephritis, SBO/ileus, diverticulitis, appendicitis.  Patient's presentation is most consistent with acute presentation with potential threat to life or bodily function.  Labs/studies  ordered: Urinalysis, CMP, lipase, CBC, CT abdomen pelvis  Interventions/Medications given:  Medications  iohexol  (OMNIPAQUE ) 300 MG/ML solution 100 mL (100 mLs Intravenous Contrast Given 12/07/23 0015)  morphine  (PF) 4 MG/ML injection 4 mg (4 mg Intravenous Given 12/07/23 0110)  ondansetron  (ZOFRAN ) injection 4 mg (4 mg Intravenous Given 12/07/23 0110)    (Note:  hospital course my include additional interventions and/or labs/studies not listed above.)   Patient declines pain medication and nausea medication at this time.  Vital signs are stable and all within normal limits.  Based on the HPI, I suspect she may have had some ureteral colic.  She has moderate hemoglobinuria, no clear infection, and she is currently on antibiotics.  We will evaluate with a CT of the abdomen and pelvis.  She understands and agrees with the plan.         FINAL CLINICAL IMPRESSION(S) / ED DIAGNOSES   Final diagnoses:  Left lower quadrant abdominal pain  Multilobar lung infiltrate     Rx / DC Orders   ED Discharge Orders          Ordered    amoxicillin -clavulanate (AUGMENTIN ) 875-125 MG tablet  2 times daily        12/07/23 0324    HYDROcodone -acetaminophen  (NORCO/VICODIN) 5-325 MG tablet  Every 6 hours PRN        12/07/23 0324             Note:  This document was prepared using Dragon voice recognition software and may include unintentional dictation errors.   Lynnda Sas, MD 12/07/23 [phone]

## 2023-12-07 ENCOUNTER — Emergency Department

## 2023-12-07 DIAGNOSIS — N200 Calculus of kidney: Secondary | ICD-10-CM | POA: Diagnosis not present

## 2023-12-07 DIAGNOSIS — R1030 Lower abdominal pain, unspecified: Secondary | ICD-10-CM | POA: Diagnosis not present

## 2023-12-07 DIAGNOSIS — K449 Diaphragmatic hernia without obstruction or gangrene: Secondary | ICD-10-CM | POA: Diagnosis not present

## 2023-12-07 DIAGNOSIS — K5730 Diverticulosis of large intestine without perforation or abscess without bleeding: Secondary | ICD-10-CM | POA: Diagnosis not present

## 2023-12-07 MED ORDER — AMOXICILLIN-POT CLAVULANATE 875-125 MG PO TABS: 1.0000 | ORAL_TABLET | Freq: Two times a day (BID) | ORAL | 0 refills | Status: AC

## 2023-12-07 MED ORDER — MORPHINE SULFATE (PF) 4 MG/ML IV SOLN
4.0000 mg | Freq: Once | INTRAVENOUS | Status: AC
  Administered 2023-12-07: 4 mg via INTRAVENOUS
  Filled 2023-12-07: qty 1

## 2023-12-07 MED ORDER — IOHEXOL 300 MG/ML  SOLN
100.0000 mL | Freq: Once | INTRAMUSCULAR | Status: AC | PRN
  Administered 2023-12-07: 100 mL via INTRAVENOUS

## 2023-12-07 MED ORDER — ONDANSETRON HCL 4 MG/2ML IJ SOLN
4.0000 mg | INTRAMUSCULAR | Status: AC
  Administered 2023-12-07: 4 mg via INTRAVENOUS
  Filled 2023-12-07: qty 2

## 2023-12-07 MED ORDER — HYDROCODONE-ACETAMINOPHEN 5-325 MG PO TABS: 2.0000 | ORAL_TABLET | Freq: Four times a day (QID) | ORAL | 0 refills | Status: DC | PRN

## 2023-12-07 NOTE — Discharge Instructions (Signed)
 As we discussed, your evaluation was generally reassuring.  You may have had a small kidney stone that you passed but which cause your acute pain.  Overall you are appropriate for discharge and outpatient follow-up.  We discussed the ongoing treatment for your pneumonia as well.  Given the persistence of the infection in your lungs seen on the imaging today, we added Augmentin  to your current regimen.  Please continue treatment with the doxycycline  you were prescribed at hospital discharge and also take the full course of Augmentin  we prescribed tonight.  Take Norco as prescribed for severe pain. Do not drink alcohol, drive or participate in any other potentially dangerous activities while taking this medication as it may make you sleepy. Do not take this medication with any other sedating medications, either prescription or over-the-counter. If you were prescribed Percocet or Vicodin, do not take these with acetaminophen  (Tylenol ) as it is already contained within these medications.   This medication is an opiate (or narcotic) pain medication and can be habit forming.  Use it as little as possible to achieve adequate pain control.  Do not use or use it with extreme caution if you have a history of opiate abuse or dependence.  If you are on a pain contract with your primary care doctor or a pain specialist, be sure to let them know you were prescribed this medication today from the [facility] Emergency Department.  This medication is intended for your use only - do not give any to anyone else and keep it in a secure place where nobody else, especially children, have access to it.  It will also cause or worsen constipation, so you may want to consider taking an over-the-counter stool softener while you are taking this medication.     Return to the emergency department if you develop new or worsening symptoms that concern you.

## 2023-12-11 ENCOUNTER — Encounter: Payer: Self-pay | Admitting: Nurse Practitioner

## 2023-12-11 ENCOUNTER — Telehealth: Payer: Self-pay | Admitting: Nurse Practitioner

## 2023-12-11 ENCOUNTER — Ambulatory Visit (INDEPENDENT_AMBULATORY_CARE_PROVIDER_SITE_OTHER): Payer: Medicare Other | Admitting: Nurse Practitioner

## 2023-12-11 ENCOUNTER — Ambulatory Visit: Payer: Self-pay

## 2023-12-11 VITALS — BP 116/76 | HR 78 | Temp 98.4°F | Resp 15 | Ht 60.0 in | Wt 104.0 lb

## 2023-12-11 DIAGNOSIS — E1165 Type 2 diabetes mellitus with hyperglycemia: Secondary | ICD-10-CM

## 2023-12-11 DIAGNOSIS — D509 Iron deficiency anemia, unspecified: Secondary | ICD-10-CM

## 2023-12-11 DIAGNOSIS — F1320 Sedative, hypnotic or anxiolytic dependence, uncomplicated: Secondary | ICD-10-CM

## 2023-12-11 DIAGNOSIS — E119 Type 2 diabetes mellitus without complications: Secondary | ICD-10-CM

## 2023-12-11 DIAGNOSIS — I152 Hypertension secondary to endocrine disorders: Secondary | ICD-10-CM | POA: Diagnosis not present

## 2023-12-11 DIAGNOSIS — Z0000 Encounter for general adult medical examination without abnormal findings: Secondary | ICD-10-CM

## 2023-12-11 DIAGNOSIS — E1159 Type 2 diabetes mellitus with other circulatory complications: Secondary | ICD-10-CM

## 2023-12-11 DIAGNOSIS — K5100 Ulcerative (chronic) pancolitis without complications: Secondary | ICD-10-CM

## 2023-12-11 DIAGNOSIS — Z7985 Long-term (current) use of injectable non-insulin antidiabetic drugs: Secondary | ICD-10-CM

## 2023-12-11 DIAGNOSIS — K529 Noninfective gastroenteritis and colitis, unspecified: Secondary | ICD-10-CM

## 2023-12-11 MED ORDER — NYSTATIN 100000 UNIT/GM EX CREA: 1.0000 | TOPICAL_CREAM | Freq: Two times a day (BID) | CUTANEOUS | 0 refills | Status: DC

## 2023-12-11 MED ORDER — ALBUTEROL SULFATE (2.5 MG/3ML) 0.083% IN NEBU: 2.5000 mg | INHALATION_SOLUTION | Freq: Four times a day (QID) | RESPIRATORY_TRACT | 3 refills | Status: AC | PRN

## 2023-12-11 MED ORDER — ALBUTEROL SULFATE HFA 108 (90 BASE) MCG/ACT IN AERS: 2.0000 | INHALATION_SPRAY | Freq: Four times a day (QID) | RESPIRATORY_TRACT | 5 refills | Status: AC | PRN

## 2023-12-11 NOTE — Patient Instructions (Signed)
 You have an order for:  []   2D Mammogram  []   3D Mammogram  [x]   Bone Density     Please call for appointment:  [facility]  [address] [place] [address] [phone] [facility] [address] [place], [place] [address] [phone] [facility] [address] [place]anie [place], [place] [address] [phone]   Make sure to wear two-piece clothing.  No lotions, powders, or deodorants the day of the appointment. Make sure to bring picture ID and insurance card.  Bring list of medications you are currently taking including any supplements.   Schedule your [place] screening mammogram through MyChart!   Log into your MyChart account.  Go to 'Visit' (or 'Appointments' if on mobile App) --> Schedule an Appointment  Under 'Select a Reason for Visit' choose the Mammogram Screening option.  Complete the pre-visit questions and select the time and place that best fits your schedule.

## 2023-12-11 NOTE — Assessment & Plan Note (Signed)
 Chronic.  Well controlled at this time.  Recommend checking blood pressures at home regularly.  Concern for dehydration given that patient is on two antibiotics from the ER.  DASH diet at home.  LABS: CBC and CMP.  Blood pressure is controlled.  Recommend increasing water intake and moving with caution when changing positions.  Follow up in 1 month.  Call sooner if concerns arise.

## 2023-12-11 NOTE — Assessment & Plan Note (Signed)
 Followed by Psychiatry.  Encouraged her not to use extra medication.  Recent note reviewed from her visit on 12/05/23.  Discussed that her breathing concerns are likely anxiety related.

## 2023-12-11 NOTE — Progress Notes (Signed)
 BP 116/76 (BP Location: Left Arm, Patient Position: Sitting, Cuff Size: Normal)   Pulse 78   Temp 98.4 F (36.9 C) (Oral)   Resp 15   Ht 5' (1.524 m)   Wt 104 lb (47.2 kg)   LMP  (LMP Unknown)   SpO2 100%   BMI 20.31 kg/m    Subjective:    Patient ID: Amy Hansen, female    DOB: 12/07/1955, 68 y.o.   MRN: [id number]  HPI: Amy Hansen is a 68 y.o. female  Chief Complaint  Patient presents with   Chills    Started this morning but has bene off and on with the pneumonia.     Hypertension    Thinks it might be running but not really feeling anything.    Diabetes    Has not been checking at home.    Hospitalization Follow-up    Believed to have passed a kidney stone and CT showed still inflammation in her lungs.    DIABETES Last A1c 9.8% May.  She is currently taking her Mounjaro .  Has been in the hospital and on steroids recently for pneumonia.  Endorses Polyuria and Polydipsia.  She has lost about 5lbs since her recent hospitalization.   She is followed by Dr. General Kenner for GI, for her pancolitis.  She did miss her recent appt. (takes Entyvio ) -- feels feverish, weakness. No N/V and is able to keep food and fluids down.  Having significant diarrhea due to being on Augmentin  and Doxycycline  from her recent ER visit for colitis.    Hypoglycemic episodes:no Polydipsia/polyuria: yes Visual disturbance: no Chest pain: no Paresthesias: no Glucose Monitoring: yes  Accucheck frequency: not checking  Fasting glucose:  Post prandial:  Evening:  Before meals: Taking Insulin ?: no  Long acting insulin :  Short acting insulin : Blood Pressure Monitoring: not checking Retinal Examination: Up to Date -- Dr. Napolean Backbone Exam: Up to Date Pneumovax: Up to Date Influenza: Up to Date Aspirin : no   HYPERTENSION / HYPERLIPIDEMIA/HF Currently has Lasix  to take and Rosuvastatin . Satisfied with current treatment? yes Duration of hypertension: chronic BP monitoring frequency:  rarely BP range:  BP medication side effects: no Duration of hyperlipidemia: chronic Cholesterol medication side effects: no Cholesterol supplements: none Medication compliance: fair compliance Aspirin : no Recent stressors: yes Recurrent headaches: no Visual changes: no Palpitations: no Dyspnea: yes occasional recently Chest pain: no Lower extremity edema: no Dizzy/lightheaded: yes   DEPRESSION Has Seroquel , Wellbutrin , Lamictal  to take at home.  She visits [facility] and they are weaning her off Xanax  -- is tolerating this she reports.  She is is very down right now due to her health.  She can't seem to get an upper hand on her current health issues.     Mood status: stable Satisfied with current treatment?: yes Symptom severity: moderate  Duration of current treatment : chronic Side effects: no Medication compliance: good compliance Psychotherapy/counseling: yes in the past Depressed mood: yes Anxious mood: yes Anhedonia: yes Significant weight loss or gain: no Insomnia: yes hard to stay asleep Fatigue: yes Feelings of worthlessness or guilt: no Impaired concentration/indecisiveness: no Suicidal ideations: no Hopelessness: no Crying spells: yes    12/11/2023   10:23 AM 10/07/2023   10:27 AM 07/11/2023   10:41 AM 01/04/2023    2:54 PM 12/31/2022    2:13 PM  Depression screen PHQ 2/9  Decreased Interest 0 0 3 2 2   Down, Depressed, Hopeless 0 0 3 3 3   PHQ - 2  Score 0 0 6 5 5   Altered sleeping   3 3 3   Tired, decreased energy   3 2 2   Change in appetite   3 3 3   Feeling bad or failure about yourself    3 3 3   Trouble concentrating   3 3 3   Moving slowly or fidgety/restless   3 0 3  Suicidal thoughts   3 2 3   PHQ-9 Score   27 21 25   Difficult doing work/chores   Extremely dIfficult Extremely dIfficult Extremely dIfficult       07/11/2023   10:45 AM 12/31/2022    2:15 PM 01/18/2022    3:25 PM 08/01/2021    9:36 AM  GAD 7 : Generalized Anxiety Score   Nervous, Anxious, on Edge 3 3 3 3   Control/stop worrying 3 3 2 3   Worry too much - different things 3 3 3 3   Trouble relaxing 3 3 2 3   Restless 3 3 3 3   Easily annoyed or irritable 3 3 3 1   Afraid - awful might happen 3 3 3 1   Total GAD 7 Score 21 21 19 17   Anxiety Difficulty Extremely difficult Extremely difficult Very difficult Somewhat difficult   Relevant past medical, surgical, family and social history reviewed and updated as indicated. Interim medical history since our last visit reviewed. Allergies and medications reviewed and updated.  Review of Systems  Constitutional:  Positive for unexpected weight change.  Eyes:  Negative for visual disturbance.  Respiratory:  Negative for cough, chest tightness and shortness of breath.   Cardiovascular:  Negative for chest pain, palpitations and leg swelling.  Gastrointestinal:  Positive for diarrhea. Negative for nausea and vomiting.  Endocrine: Positive for polydipsia and polyuria.  Neurological:  Negative for dizziness, numbness and headaches.  Psychiatric/Behavioral:  Positive for dysphoric mood. The patient is nervous/anxious.     Per HPI unless specifically indicated above     Objective:    BP 116/76 (BP Location: Left Arm, Patient Position: Sitting, Cuff Size: Normal)   Pulse 78   Temp 98.4 F (36.9 C) (Oral)   Resp 15   Ht 5' (1.524 m)   Wt 104 lb (47.2 kg)   LMP  (LMP Unknown)   SpO2 100%   BMI 20.31 kg/m   Wt Readings from Last 3 Encounters:  12/11/23 104 lb (47.2 kg)  12/06/23 108 lb (49 kg)  12/05/23 109 lb 6.4 oz (49.6 kg)    Physical Exam Vitals and nursing note reviewed.  Constitutional:      General: She is awake. She is not in acute distress.    Appearance: Normal appearance. She is well-developed, well-groomed and underweight. She is not ill-appearing or toxic-appearing.  HENT:     Head: Normocephalic.     Right Ear: Hearing and external ear normal.     Left Ear: Hearing and external ear normal.   Eyes:     General: Lids are normal.        Right eye: No discharge.        Left eye: No discharge.     Conjunctiva/sclera: Conjunctivae normal.     Pupils: Pupils are equal, round, and reactive to light.  Neck:     Thyroid : No thyromegaly.     Vascular: No carotid bruit.  Cardiovascular:     Rate and Rhythm: Regular rhythm. Tachycardia present.     Heart sounds: Normal heart sounds. No murmur heard.    No gallop.     Comments: Heart  rate on auscultation 123 with moving from chair to exam table.  Regular. Pulmonary:     Effort: Pulmonary effort is normal. No accessory muscle usage or respiratory distress.     Breath sounds: Normal breath sounds. No decreased breath sounds, wheezing or rhonchi.  Abdominal:     General: Bowel sounds are normal. There is no distension.     Palpations: Abdomen is soft.     Tenderness: There is generalized abdominal tenderness. There is no right CVA tenderness or left CVA tenderness.     Comments: Overall tenderness on exam to all quadrants, worse to LUQ.  Musculoskeletal:     Cervical back: Normal range of motion and neck supple.     Right lower leg: No edema.     Left lower leg: No edema.  Lymphadenopathy:     Cervical: No cervical adenopathy.  Skin:    General: Skin is warm and dry.  Neurological:     Mental Status: She is alert and oriented to person, place, and time.     Deep Tendon Reflexes: Reflexes are normal and symmetric.     Reflex Scores:      Brachioradialis reflexes are 2+ on the right side and 2+ on the left side.      Patellar reflexes are 2+ on the right side and 2+ on the left side. Psychiatric:        Attention and Perception: Attention normal.        Mood and Affect: Mood normal.        Speech: Speech normal.        Behavior: Behavior normal. Behavior is cooperative.        Thought Content: Thought content normal.     Results for orders placed or performed during the hospital encounter of 12/06/23  Lipase, blood    Collection Time: 12/06/23  8:03 PM  Result Value Ref Range   Lipase 38 11 - 51 U/L  Comprehensive metabolic panel   Collection Time: 12/06/23  8:03 PM  Result Value Ref Range   Sodium 134 (L) 135 - 145 mmol/L   Potassium 4.1 3.5 - 5.1 mmol/L   Chloride 103 98 - 111 mmol/L   CO2 22 22 - 32 mmol/L   Glucose, Bld 185 (H) 70 - 99 mg/dL   BUN 13 8 - 23 mg/dL   Creatinine, Ser 1.61 0.44 - 1.00 mg/dL   Calcium  7.9 (L) 8.9 - 10.3 mg/dL   Total Protein 6.3 (L) 6.5 - 8.1 g/dL   Albumin 2.4 (L) 3.5 - 5.0 g/dL   AST 41 15 - 41 U/L   ALT 28 0 - 44 U/L   Alkaline Phosphatase 145 (H) 38 - 126 U/L   Total Bilirubin 0.6 0.0 - 1.2 mg/dL   GFR, Estimated >09 >60 mL/min   Anion gap 9 5 - 15  CBC   Collection Time: 12/06/23  8:03 PM  Result Value Ref Range   WBC 3.8 (L) 4.0 - 10.5 K/uL   RBC 4.09 3.87 - 5.11 MIL/uL   Hemoglobin 10.5 (L) 12.0 - 15.0 g/dL   HCT 45.4 (L) 09.8 - 11.9 %   MCV 81.7 80.0 - 100.0 fL   MCH 25.7 (L) 26.0 - 34.0 pg   MCHC 31.4 30.0 - 36.0 g/dL   RDW 14.7 82.9 - 56.2 %   Platelets 733 (H) 150 - 400 K/uL   nRBC 0.0 0.0 - 0.2 %  Urinalysis, Routine w reflex microscopic -Urine, Clean Catch   Collection  Time: 12/06/23  8:03 PM  Result Value Ref Range   Color, Urine YELLOW (A) YELLOW   APPearance HAZY (A) CLEAR   Specific Gravity, Urine 1.019 1.005 - 1.030   pH 6.0 5.0 - 8.0   Glucose, UA NEGATIVE NEGATIVE mg/dL   Hgb urine dipstick MODERATE (A) NEGATIVE   Bilirubin Urine NEGATIVE NEGATIVE   Ketones, ur NEGATIVE NEGATIVE mg/dL   Protein, ur NEGATIVE NEGATIVE mg/dL   Nitrite NEGATIVE NEGATIVE   Leukocytes,Ua SMALL (A) NEGATIVE   RBC / HPF >50 0 - 5 RBC/hpf   WBC, UA 11-20 0 - 5 WBC/hpf   Bacteria, UA RARE (A) NONE SEEN   Squamous Epithelial / HPF 6-10 0 - 5 /HPF   Mucus PRESENT    Hyaline Casts, UA PRESENT    *Note: Due to a large number of results and/or encounters for the requested time period, some results have not been displayed. A complete set of results can be  found in Results Review.      Assessment & Plan:   Problem List Items Addressed This Visit       Cardiovascular and Mediastinum   Hypertension associated with type 2 diabetes mellitus (HCC) - Primary   Chronic.  Well controlled at this time.  Recommend checking blood pressures at home regularly.  Concern for dehydration given that patient is on two antibiotics from the ER.  DASH diet at home.  LABS: CBC and CMP.  Blood pressure is controlled.  Recommend increasing water intake and moving with caution when changing positions.  Follow up in 1 month.  Call sooner if concerns arise.       Relevant Orders   Comprehensive metabolic panel with GFR     Digestive   Pancolitis (HCC)   Chronic.  Followed by GI.  Currently on Doxycyline and Augmentin .  Missed recent GI appt due to hospitalization.  Continues on Entyvio .  Last Infusion was 11/05/23.  Recommend making new appointment with GI.        Relevant Orders   Hepatic function panel     Endocrine   Uncontrolled type 2 diabetes mellitus with hyperglycemia, without long-term current use of insulin  (HCC)   Chronic. Not well controlled.  Last A1c was 9.8%.  Currently on Mounjaro  5mg .  Hesitant to increase dose today due to current GI upset with Augmentin  and Doxycyline.  Labs ordered today.  Follow up in 1 month.  Call sooner if concerns arise.       Relevant Orders   Lipid panel   HgB A1c   Diabetes mellitus treated with injections of non-insulin  medication (HCC)     Other   Iron deficiency anemia   Chronic. ER lab work showed Low Hemoglobin of 10.5 and Hematocrit of 33.4.  Repeat CBC checked at visit today.        Relevant Orders   CBC w/Diff   Benzodiazepine dependence (HCC)   Followed by Psychiatry.  Encouraged her not to use extra medication.  Recent note reviewed from her visit on 12/05/23.  Discussed that her breathing concerns are likely anxiety related.        Other Visit Diagnoses       Encounter for Medicare annual  wellness exam            Follow up plan: Return in 1 year (on 12/10/2024).

## 2023-12-11 NOTE — Telephone Encounter (Signed)
 Please see other phone call and mychart messages with the patient.

## 2023-12-11 NOTE — Assessment & Plan Note (Signed)
 Chronic. Not well controlled.  Last A1c was 9.8%.  Currently on Mounjaro  5mg .  Hesitant to increase dose today due to current GI upset with Augmentin  and Doxycyline.  Labs ordered today.  Follow up in 1 month.  Call sooner if concerns arise.

## 2023-12-11 NOTE — Telephone Encounter (Signed)
 Patient called  stating that she did not get a RX for Nystatin  Cream from [facility]. They started that it was not sent with  the others. Please advise

## 2023-12-11 NOTE — Telephone Encounter (Signed)
 See mychart message that was sent to patient as a reply. RX was called in verbally to the pharmacy.

## 2023-12-11 NOTE — Assessment & Plan Note (Signed)
 Chronic. ER lab work showed Low Hemoglobin of 10.5 and Hematocrit of 33.4.  Repeat CBC checked at visit today.

## 2023-12-11 NOTE — Telephone Encounter (Signed)
 Copied from CRM [id number]. Topic: Clinical - Red Word Triage >> Dec 11, 2023  4:11 PM DeAngela L wrote: Red Word that prompted transfer to Nurse Triage: patient having reation the anti biotics to the medication she has been taking and was prescribed a prescription of cream after her appt this morning and when the patient went to pick up the prescription not available and now the area is is burning and itching more now   Chief Complaint: medication issue  Additional Notes: Per patient, pharmacy has not rec'd prescription. Per chart review, RX was sent electronically at 10:15am. Call made to [facility], spoke with Iris who advises that someone will call the pharmacy and provide verbal order. Pt made aware and will follow-up again with her pharmacy within the hour.    Reason for Disposition  [1] Prescription prescribed recently is not at pharmacy AND [2] triager has access to patient's EMR AND [3] prescription is recorded in the EMR  Answer Assessment - Initial Assessment Questions 1. NAME of MEDICINE: "What medicine(s) are you calling about?"     Nystatin   2. QUESTION: "What is your question?" (e.g., double dose of medicine, side effect)     Prescription status  3. PRESCRIBER: "Who prescribed the medicine?" Reason: if prescribed by specialist, call should be referred to that group.     Mariah Shines, NP  4. SYMPTOMS: "Do you have any symptoms?" If Yes, ask: "What symptoms are you having?"  "How bad are the symptoms (e.g., mild, moderate, severe)     Burning and itching  5. PREGNANCY:  "Is there any chance that you are pregnant?" "When was your last menstrual period?"     No  Protocols used: Medication Question Call-A-AH

## 2023-12-11 NOTE — Progress Notes (Signed)
 Subjective:   Amy Hansen is a 68 y.o. female who presents for Medicare Annual (Subsequent) preventive examination.  Visit Complete: In person  Patient Medicare AWV questionnaire was completed by the patient on 12/11/23 ; I have confirmed that all information answered by patient is correct and no changes since this date.  Cardiac Risk Factors include: advanced age (>80men, >77 women);diabetes mellitus;hypertension;sedentary lifestyle     Objective:     Today's Vitals   12/11/23 0944 12/11/23 1018  BP: 116/76   Pulse: 78   Resp: 15   Temp: 98.4 F (36.9 C)   TempSrc: Oral   SpO2: 100%   Weight: 104 lb (47.2 kg)   Height: 5' (1.524 m)   PainSc: 0-No pain 0-No pain   Body mass index is 20.31 kg/m.     12/11/2023   10:22 AM 12/06/2023    8:00 PM 11/27/2023    3:00 PM 11/27/2023   11:12 AM 10/07/2023   10:27 AM 09/09/2023   12:51 PM 08/24/2023    1:34 PM  Advanced Directives  Does Patient Have a Medical Advance Directive? No No No No No No No  Would patient like information on creating a medical advance directive? No - Patient declined No - Patient declined No - Patient declined No - Patient declined No - Patient declined Yes (MAU/Ambulatory/Procedural Areas - Information given)     Current Medications (verified) Outpatient Encounter Medications as of 12/11/2023  Medication Sig   ALPRAZolam  (XANAX ) 0.5 MG tablet Take 1 tablet (0.5 mg total) by mouth 2 (two) times daily as needed for anxiety. Must last 15 days.   amoxicillin -clavulanate (AUGMENTIN ) 875-125 MG tablet Take 1 tablet by mouth 2 (two) times daily for 10 days.   aspirin  EC 81 MG tablet Take 1 tablet (81 mg) by mouth once daily. Swallow whole.   clopidogrel  (PLAVIX ) 75 MG tablet Take 1 tablet (75 mg total) by mouth daily with breakfast.   doxycycline  (VIBRAMYCIN ) 100 MG capsule Take 100 mg by mouth 2 (two) times daily.   estradiol  (ESTRACE ) 0.1 MG/GM vaginal cream Apply one pea-sized amount around the opening of  the urethra 3 times weekly. (Patient taking differently: Place 1 Applicatorful vaginally 3 (three) times a week. Apply one pea-sized amount around the opening of the urethra 3 times weekly.)   furosemide  (LASIX ) 20 MG tablet Take 1 tablet (20 mg total) by mouth daily as needed (for swelling in the legs accompanied by a weight gain of 3 pounds overnight -- USE SPARINGLY).   HYDROcodone -acetaminophen  (NORCO/VICODIN) 5-325 MG tablet Take 2 tablets by mouth every 6 (six) hours as needed for moderate pain (pain score 4-6) or severe pain (pain score 7-10).   metoprolol  tartrate (LOPRESSOR ) 25 MG tablet Take 25 mg by mouth 2 (two) times daily.   Nebulizers (COMPRESSOR/NEBULIZER) MISC 1 each by Does not apply route as directed.   nitroGLYCERIN  (NITROSTAT ) 0.4 MG SL tablet Place 1 tablet (0.4 mg total) under the tongue every 5 (five) minutes as needed.   nystatin  cream (MYCOSTATIN ) Apply 1 Application topically 2 (two) times daily.   ondansetron  (ZOFRAN -ODT) 8 MG disintegrating tablet Take 1 tablet (8 mg total) by mouth every 8 (eight) hours as needed for nausea or vomiting.   pantoprazole  (PROTONIX ) 40 MG tablet Take 1 tablet (40 mg total) by mouth daily.   pramipexole  (MIRAPEX ) 1 MG tablet Take 1 tablet (1 mg total) by mouth at bedtime.   QUEtiapine  (SEROQUEL ) 300 MG tablet Take 1 tablet (300 mg total)  by mouth at bedtime.   rOPINIRole  (REQUIP ) 1 MG tablet Take 1 tablet (1 mg total) by mouth 2 (two) times daily.   rosuvastatin  (CRESTOR ) 5 MG tablet TAKE 1 TABLET DAILY   sertraline  (ZOLOFT ) 100 MG tablet Take 1 tablet (100 mg total) by mouth daily.   tirzepatide  (MOUNJARO ) 5 MG/0.5ML Pen Inject 5 mg into the skin once a week.   ursodiol (ACTIGALL) 300 MG capsule Take 300 mg by mouth 2 (two) times daily. Take 2 capsules in the AM and 1 capsule in the PM   [DISCONTINUED] albuterol  (PROVENTIL ) (2.5 MG/3ML) 0.083% nebulizer solution Take 3 mLs (2.5 mg total) by nebulization every 6 (six) hours as needed for  wheezing or shortness of breath.   [DISCONTINUED] albuterol  (VENTOLIN  HFA) 108 (90 Base) MCG/ACT inhaler Inhale 2 puffs into the lungs every 6 (six) hours as needed for wheezing or shortness of breath.   albuterol  (PROVENTIL ) (2.5 MG/3ML) 0.083% nebulizer solution Take 3 mLs (2.5 mg total) by nebulization every 6 (six) hours as needed for wheezing or shortness of breath.   albuterol  (VENTOLIN  HFA) 108 (90 Base) MCG/ACT inhaler Inhale 2 puffs into the lungs every 6 (six) hours as needed for wheezing or shortness of breath.   isosorbide  mononitrate (IMDUR ) 30 MG 24 hr tablet Take 0.5 tablets (15 mg total) by mouth daily.   No facility-administered encounter medications on file as of 12/11/2023.    Allergies (verified) Avelox [moxifloxacin hcl in nacl], Bactrim  [sulfamethoxazole -trimethoprim ], Ciprofloxacin , Buspar  [buspirone ], Neomycin -polymyxin-dexameth, Aquaphor [lanolin-petrolatum], Depakote [divalproex sodium], Imitrex  [sumatriptan ], and Stadol [butorphanol]   History: Past Medical History:  Diagnosis Date   Acute on chronic heart failure with preserved ejection fraction (HFpEF) (HCC) 05/16/2018   AKI (acute kidney injury) (HCC) 02/05/2023   Anemia    Anxiety    Arthritis    Blood transfusion without reported diagnosis    Cataract    CHF (congestive heart failure) (HCC)    Chronic kidney disease    UTI, hematuria in urine   Colitis    Crohn's disease (HCC)    Depression    Diabetes (HCC)    Diverticulosis    Frequent headaches    Interstitial cystitis    Recurrent UTI    Restless leg syndrome    TIA (transient ischemic attack) 02/20/2021   Urinary frequency    Past Surgical History:  Procedure Laterality Date   bariatric bypass  2012   BIOPSY  05/03/2020   Procedure: BIOPSY;  Surgeon: Janel Medford, MD;  Location: [facility] ENDOSCOPY;  Service: Endoscopy;;   CARPAL TUNNEL RELEASE Right 2003   CARPAL TUNNEL RELEASE Right    2008   CHOLECYSTECTOMY  1975   COLONOSCOPY      COLONOSCOPY WITH PROPOFOL  N/A 05/03/2020   Procedure: COLONOSCOPY WITH PROPOFOL ;  Surgeon: Janel Medford, MD;  Location: [facility] ENDOSCOPY;  Service: Endoscopy;  Laterality: N/A;   CORONARY STENT INTERVENTION N/A 05/15/2023   Procedure: CORONARY STENT INTERVENTION;  Surgeon: Sammy Crisp, MD;  Location: [facility] INVASIVE CV LAB;  Service: Cardiovascular;  Laterality: N/A;   CYSTOSCOPY W/ RETROGRADES Bilateral 06/06/2015   Procedure: CYSTOSCOPY WITH RETROGRADE PYELOGRAM;  Surgeon: Christina Coyer, MD;  Location: [facility] ORS;  Service: Urology;  Laterality: Bilateral;   EYE SURGERY Left 07/06/2022   FL INJ LEFT KNEE CT ARTHROGRAM ([facility] HX) Left    1995   GASTRIC BYPASS  2010   HAND SURGERY Left 01/19/2021   Thumb   HEMORRHOID SURGERY  2013   KNEE ARTHROSCOPY Left 1996  LEFT HEART CATH AND CORONARY ANGIOGRAPHY Left 05/15/2023   Procedure: LEFT HEART CATH AND CORONARY ANGIOGRAPHY;  Surgeon: Sammy Crisp, MD;  Location: [facility] INVASIVE CV LAB;  Service: Cardiovascular;  Laterality: Left;   TONSILLECTOMY     TOTAL ABDOMINAL HYSTERECTOMY W/ BILATERAL SALPINGOOPHORECTOMY     Family History  Problem Relation Age of Onset   Colon cancer Mother    Stroke Father    Heart disease Father    Heart failure Sister    Diabetes Paternal Grandmother    Bladder Cancer Neg Hx    Kidney disease Neg Hx    Prostate cancer Neg Hx    Kidney cancer Neg Hx    Pancreatic cancer Neg Hx    Esophageal cancer Neg Hx    Stomach cancer Neg Hx    Rectal cancer Neg Hx    Breast cancer Neg Hx    Social History   Socioeconomic History   Marital status: Married    Spouse name: Not on file   Number of children: Not on file   Years of education: Not on file   Highest education level: Some college, no degree  Occupational History   Not on file  Tobacco Use   Smoking status: Former    Current packs/day: 0.00    Types: Cigarettes    Quit date: 04/25/1975    Years since quitting: 48.6   Smokeless tobacco: Never    Tobacco comments:    quit 40 years ago  Vaping Use   Vaping status: Never Used  Substance and Sexual Activity   Alcohol use: No    Alcohol/week: 0.0 standard drinks of alcohol   Drug use: No   Sexual activity: Not Currently    Birth control/protection: Post-menopausal, Surgical  Other Topics Concern   Not on file  Social History Narrative   Caffeine  5 servings per day.   Social Drivers of Health   Financial Resource Strain: Medium Risk (07/09/2023)   Overall Financial Resource Strain (CARDIA)    Difficulty of Paying Living Expenses: Somewhat hard  Food Insecurity: No Food Insecurity (11/27/2023)   Hunger Vital Sign    Worried About Running Out of Food in the Last Year: Never true    Ran Out of Food in the Last Year: Never true  Transportation Needs: No Transportation Needs (11/27/2023)   PRAPARE - Administrator, Civil Service (Medical): No    Lack of Transportation (Non-Medical): No  Physical Activity: Insufficiently Active (07/09/2023)   Exercise Vital Sign    Days of Exercise per Week: 3 days    Minutes of Exercise per Session: 20 min  Stress: Stress Concern Present (07/09/2023)   [employer] of Occupational Health - Occupational Stress Questionnaire    Feeling of Stress : Very much  Social Connections: Socially Integrated (11/27/2023)   Social Connection and Isolation Panel [NHANES]    Frequency of Communication with Friends and Family: More than three times a week    Frequency of Social Gatherings with Friends and Family: Twice a week    Attends Religious Services: More than 4 times per year    Active Member of [employer] or Organizations: Yes    Attends Engineer, structural: More than 4 times per year    Marital Status: Married    Tobacco Counseling Counseling given: Not Answered Tobacco comments: quit 40 years ago   Clinical Intake:     Pain : No/denies pain Pain Score: 0-No pain     BMI - recorded:  20.31 Nutritional Status: BMI  of 19-24  Normal Nutritional Risks: None Diabetes: Yes CBG done?: No Did pt. bring in CBG monitor from home?: No  How often do you need to have someone help you when you read instructions, pamphlets, or other written materials from your doctor or pharmacy?: 1 - Never  Interpreter Needed?: No      Activities of Daily Living    12/11/2023   10:19 AM 11/27/2023    3:00 PM  In your present state of health, do you have any difficulty performing the following activities:  Hearing? 0 0  Vision? 0 0  Difficulty concentrating or making decisions? 0 0  Walking or climbing stairs? 1   Comment Slower than usual   Dressing or bathing? 0   Doing errands, shopping? 0 0  Preparing Food and eating ? N   Using the Toilet? N   In the past six months, have you accidently leaked urine? N   Do you have problems with loss of bowel control? N   Managing your Medications? N   Managing your Finances? N   Housekeeping or managing your Housekeeping? N     Patient Care Team: Aileen Alexanders, NP as PCP - General End, Veryl Gottron, MD as PCP - Cardiology (Cardiology) Burnie Cartwright, RN as Registered Nurse Arlette Benders, RN (Inactive) as Registered Nurse  Indicate any recent Medical Services you may have received from other than [facility] providers in the past year (date may be approximate).     Assessment:    This is a routine wellness examination for Dezera.  Hearing/Vision screen No results found.   Goals Addressed               This Visit's Progress     care coordination activities   On track     Interventions Today    Flowsheet Row Most Recent Value  Chronic Disease   Chronic disease during today's visit Other  [broken left arm, currently in a brace, due to type of brace cannot put in a cast 4-6 weeks projected healing process]  General Interventions   General Interventions Discussed/Reviewed General Interventions Discussed, Doctor Visits  Doctor Visits Discussed/Reviewed Doctor  Visits Reviewed  [appt on 6/10 [facility], will look at x-ray to see if surgery is needed]  Mental Health Interventions   Mental Health Discussed/Reviewed --  [medication management f/u scheduled for 6/11, patient agreeable to follow up with a therapist as well, emotional support provided, ongoing mental health follow up encouraged]  Nutrition Interventions   Nutrition Discussed/Reviewed Nutrition Discussed  [patient discused poor appetite, states will add more protein to diet -encouraged patient to discuss  option with provider]  Safety Interventions   Safety Discussed/Reviewed Safety Discussed  [encouraged patient to contact [phone] in the event of a mental health counseling]            PharmD "I can't afford my medications" (pt-stated)   On track     CARE PLAN ENTRY (see longtitudinal plan of care for additional care plan information)  Current Barriers:  Polypharmacy; complex patient with multiple comorbidities including HFpEF, T2DM, depression, OCD, RLS Financial concerns - patient is uninsured. Over income for insulin  assistance. Utilizing [employer] My$99 insulin  program. Confirmed on Wednesday that they pharmacy ran this through appropriately. Most recent eGFR: ~90 T2DM: A1c 14%, metformin  1000 mg BID, glipizide  5 mg BID, Toujeo  22 units daily  Depression: Managed by Marvia Slocumb, PA, psychiatry. sertraline  150 mg daily, bupropion  XL 450 mg daily,  lamotrigine  400 mg QPM, risperidone  3 mg QPM, alprazolam  0.5 mg up to 4 times daily PRN, using 2-3 times daily  HFpEF: furosemide  20 mg daily, prescribed metoprolol  succinate 12.5 mg daily per Dr. Nolan Battle after Zio placement, but patient reports that she didn't pick it up or ever start it RLS: pramipexole  1 mg QPM  Pharmacist Clinical Goal(s):  Over the next 90 days, patient will work with PharmD and provider towards optimized medication management  Interventions: Patient with adequate supply of Toujeo . Denied any other questions or concerns at  this time.   Patient Self Care Activities:  Patient will take medications as prescribed  Please see past updates related to this goal by clicking on the "Past Updates" button in the selected goal        SW-Matintain My Quality of Life   On track     Follow Up Date- 90 days from 05/06/20   - check out options for in-home help, long-term care or hospice - complete a living will - discuss my treatment options with the doctor or nurse - do one enjoyable thing every day - do something different, like talking to a new person or going to a new place, every day - learn something new by asking, reading and searching the Internet every day - make an audio or video recording for my loved ones - make shared treatment decisions with doctor - meditate daily - name a health care proxy (decision maker) - share memories using a picture album or scrapbook with my loved ones - spend time with a child every day, borrow one if I have to - spend time outdoors at least 3 times a week - strengthen or fix relationships with loved ones    Why is this important?   Having a long-term illness can be scary.  It can also be stressful for you and your caregiver.  These steps may help.    Notes: Patient has had several recent hospital admissions. She was admitted for a GI bleed and was diagnosed with Colitis and Crohn's disease. Patient wishes for RNCM involvement to provide education and material on new disorders. LCSW will place new referral.       SW-Protect My Health   On track     Follow Up Date 90 days from 05/06/20   - schedule appointment for flu shot - schedule appointment for vaccines needed due to my age or health - schedule recommended health tests (blood work, mammogram, colonoscopy, pap test) - schedule and keep appointment for annual check-up    Why is this important?   Screening tests can find diseases early when they are easier to treat.  Your doctor or nurse will talk with you about which  tests are important for you.  Getting shots for common diseases like the flu and shingles will help prevent them.     Notes: Patient's caregiver is spouse. She reports that he is a great support to her. She states that she was having difficulty walking two weeks ago but is now able to walk again without her walker. Patient is using her deceased mother's medical equipment for mobility assistance.       SW-Track and Manage My Symptoms   On track     Timeframe:  Long-Range Goal Priority:  Medium Start Date:   11/10/20                       Expected End Date:  02/09/21  Follow Up Date- 01/09/21  Current barriers:   Chronic Mental Health needs related to Depression and Anxiety Needs Support, Education, and Care Coordination in order to meet unmet mental health needs. Lacks knowledge of where and how to connect for mental health support resources  Clinical Goal(s): Over the next 120 days, patient will work with SW to reduce or manage symptoms of anxiety and depression and increase knowledge and/or ability of: coping skills, healthy habits, self-management skills, and stress reduction.until connected for ongoing counseling.   Clinical Interventions:  Assessed patient's previous treatment, needs, coping skills, current treatment, support system and barriers to care   brief mental health assessment;Suicidal Ideation/Homicidal Ideation: No ) Provided basic mental health support, education  Provided interventions: Active listening / Reflection utilized , Emotional Supportive Provided, Behavioral Activation, Motivational Interviewing, and Verbalization of feelings encouraged  ; Patient interviewed and appropriate assessments performed Assisted patient/caregiver with obtaining information about health plan benefits Provided education and assistance to client regarding Advanced Directives. Provided education to patient/caregiver regarding level of care options. Discussed several  options for long term counseling based on need and insurance.  Reviewed mental health medications with patient prescribed and discussed compliance. Collaboration with PCP regarding development and update of comprehensive plan of care as evidenced by provider attestation and co-signature Patient was informed that current CCM LCSW will be leaving position next month and her next CCM Social Work follow up visit will be with another LCSW. Patient was appreciative of support provided and receptive to news Inter-disciplinary care team collaboration (see longitudinal plan of care) Patient has a long term mental health provider that she sees regular. Patient sees Marvia Slocumb at [facility] in [place], [place]. Patient is currently on several medications to treat her anxiety/depression which include: Lamictal , Wellbutrin , Zoloft , Risperdal  and Xanax .   Patient Goals/Self-Care Activities: Over the next 120 days - avoid negative self-talk - develop a personal safety plan - develop a plan to deal with triggers like holidays, anniversaries - exercise at least 2 to 3 times per week - have a plan for how to handle bad days - journal feelings and what helps to feel better or worse - spend time or talk with others at least 2 to 3 times per week - spend time or talk with others every day - watch for early signs of feeling worse - write in journal every day        Depression Screen    12/11/2023   10:23 AM 10/07/2023   10:27 AM 09/09/2023   12:52 PM 07/11/2023   10:41 AM 06/07/2023    9:19 AM 02/18/2023    3:23 PM 01/04/2023    2:54 PM  PHQ 2/9 Scores  PHQ - 2 Score 0 0  6   5  PHQ- 9 Score    27   21  Exception Documentation   Patient refusal  Patient refusal Patient refusal     Fall Risk    12/11/2023    9:46 AM 10/07/2023   10:27 AM 09/09/2023   12:51 PM 05/23/2023   10:06 AM 01/04/2023    2:38 PM  Fall Risk   Falls in the past year? 0 0 1 1 1   Number falls in past yr: 0 0 1 1 1   Injury with  Fall? 0  1 1 1   Comment    broke arm   May at work.  tripped over vacuum cleaner cord   Risk for fall due to : No Fall Risks No Fall Risks History  of fall(s) Medication side effect;History of fall(s);Other (Comment)   Risk for fall due to: Comment    history of strokes in past , was falling for several month.  during this time.  much improved now.   Follow up Falls evaluation completed Falls evaluation completed Falls evaluation completed Falls evaluation completed;Education provided;Falls prevention discussed     MEDICARE RISK AT HOME: Medicare Risk at Home Any stairs in or around the home?: Yes If so, are there any without handrails?: Yes Home free of loose throw rugs in walkways, pet beds, electrical cords, etc?: Yes Adequate lighting in your home to reduce risk of falls?: Yes Life alert?: No Use of a cane, walker or w/c?: No Grab bars in the bathroom?: No Shower chair or bench in shower?: Yes Elevated toilet seat or a handicapped toilet?: No  TIMED UP AND GO:  Was the test performed?  Yes  Length of time to ambulate 10 feet: 10 sec Gait slow and steady without use of assistive device    Cognitive Function:    08/16/2022    1:29 PM  MMSE - Mini Mental State Exam  Orientation to time   Orientation to Place   Registration   Attention/ Calculation   Recall   Language- name 2 objects   Language- repeat   Language- follow 3 step command   Language- read & follow direction   Write a sentence      Information is confidential and restricted. Go to Review Flowsheets to unlock data.        12/11/2023   10:23 AM  6CIT Screen  What Year? 0 points  What month? 0 points  What time? 0 points  Count back from 20 0 points  Months in reverse 2 points  Repeat phrase 8 points  Total Score 10 points    Immunizations Immunization History  Administered Date(s) Administered   Influenza,inj,Quad PF,6+ Mos 05/23/2015, 05/30/2016, 05/21/2017, 03/25/2018, 05/03/2020, 05/01/2022    Influenza-Unspecified 05/30/2016, 05/08/2021   MMR 02/23/2000   PFIZER Comirnaty(Gray Top)Covid-19 Tri-Sucrose Vaccine 08/29/2020   PFIZER(Purple Top)SARS-COV-2 Vaccination 10/16/2019, 11/10/2019, 08/29/2020   PNEUMOCOCCAL CONJUGATE-20 07/18/2022   Pneumococcal Polysaccharide-23 06/12/2005   Tdap 05/02/2011, 05/01/2022    TDAP status: Up to date  Flu Vaccine status: Up to date  Pneumococcal vaccine status: Up to date  Covid-19 vaccine status: Completed vaccines  Qualifies for Shingles Vaccine? Yes   Zostavax completed No   Shingrix Completed?: No.    Education has been provided regarding the importance of this vaccine. Patient has been advised to call insurance company to determine out of pocket expense if they have not yet received this vaccine. Advised may also receive vaccine at local pharmacy or Health Dept. Verbalized acceptance and understanding.  Screening Tests Health Maintenance  Topic Date Due   Zoster Vaccines- Shingrix (1 of 2) Never done   DEXA SCAN  Never done   COVID-19 Vaccine (5 - 2024-25 season) 03/31/2023   Colonoscopy  12/27/2023   OPHTHALMOLOGY EXAM  12/27/2023   INFLUENZA VACCINE  02/28/2024   HEMOGLOBIN A1C  05/30/2024   MAMMOGRAM  06/08/2024   Diabetic kidney evaluation - Urine ACR  07/10/2024   FOOT EXAM  09/08/2024   Diabetic kidney evaluation - eGFR measurement  12/05/2024   Medicare Annual Wellness (AWV)  12/10/2024   DTaP/Tdap/Td (3 - Td or Tdap) 05/01/2032   Pneumonia Vaccine 31+ Years old  Completed   Hepatitis C Screening  Completed   HPV VACCINES  Aged Out   Meningococcal  B Vaccine  Aged Out    Health Maintenance  Health Maintenance Due  Topic Date Due   Zoster Vaccines- Shingrix (1 of 2) Never done   DEXA SCAN  Never done   COVID-19 Vaccine (5 - 2024-25 season) 03/31/2023   Colonoscopy  12/27/2023    Colorectal cancer screening: Type of screening: Colonoscopy. Completed 12/26/2021. Repeat every 2 years  Mammogram status:  Completed 06/09/2023. Repeat every year.  Bone Density status: Ordered 02/04/2023. Pt provided with contact info and advised to call to schedule appt.  Lung Cancer Screening: (Low Dose CT Chest recommended if Age 19-80 years, 20 pack-year currently smoking OR have quit w/in 15years.) does not qualify.    Additional Screening:  Hepatitis C Screening: does qualify; Completed 03/30/2021  Vision Screening: Recommended annual ophthalmology exams for early detection of glaucoma and other disorders of the eye. Is the patient up to date with their annual eye exam?  Yes   Dental Screening: Recommended annual dental exams for proper oral hygiene  Diabetic Foot Exam:   Community Resource Referral / Chronic Care Management: CRR required this visit?  No   CCM required this visit?  No     Plan:     I have personally reviewed and noted the following in the patient's chart:   Medical and social history Use of alcohol, tobacco or illicit drugs  Current medications and supplements including opioid prescriptions. Patient is not currently taking opioid prescriptions. Functional ability and status Nutritional status Physical activity Advanced directives List of other physicians Hospitalizations, surgeries, and ER visits in previous 12 months Vitals Screenings to include cognitive, depression, and falls Referrals and appointments  In addition, I have reviewed and discussed with patient certain preventive protocols, quality metrics, and best practice recommendations. A written personalized care plan for preventive services as well as general preventive health recommendations were provided to patient.     Jilda Most Kien Mirsky, CMA   12/11/2023   After Visit Summary: (In Person-Printed) AVS printed and given to the patient

## 2023-12-11 NOTE — Assessment & Plan Note (Signed)
 Chronic.  Followed by GI.  Currently on Doxycyline and Augmentin .  Missed recent GI appt due to hospitalization.  Continues on Entyvio .  Last Infusion was 11/05/23.  Recommend making new appointment with GI.

## 2023-12-12 ENCOUNTER — Ambulatory Visit: Payer: Self-pay | Admitting: Nurse Practitioner

## 2023-12-12 LAB — COMPREHENSIVE METABOLIC PANEL WITH GFR
ALT: 35 IU/L — ABNORMAL HIGH (ref 0–32)
AST: 48 IU/L — ABNORMAL HIGH (ref 0–40)
Albumin: 3.1 g/dL — ABNORMAL LOW (ref 3.9–4.9)
Alkaline Phosphatase: 187 IU/L — ABNORMAL HIGH (ref 44–121)
BUN/Creatinine Ratio: 8 — ABNORMAL LOW (ref 12–28)
BUN: 5 mg/dL — ABNORMAL LOW (ref 8–27)
Bilirubin Total: 0.2 mg/dL (ref 0.0–1.2)
CO2: 21 mmol/L (ref 20–29)
Calcium: 8.6 mg/dL — ABNORMAL LOW (ref 8.7–10.3)
Chloride: 102 mmol/L (ref 96–106)
Creatinine, Ser: 0.59 mg/dL (ref 0.57–1.00)
Globulin, Total: 2.8 g/dL (ref 1.5–4.5)
Glucose: 127 mg/dL — ABNORMAL HIGH (ref 70–99)
Potassium: 4 mmol/L (ref 3.5–5.2)
Sodium: 136 mmol/L (ref 134–144)
Total Protein: 5.9 g/dL — ABNORMAL LOW (ref 6.0–8.5)
eGFR: 99 mL/min/{1.73_m2} (ref >59)

## 2023-12-12 LAB — HEPATIC FUNCTION PANEL: Bilirubin, Direct: 0.12 mg/dL (ref 0.00–0.40)

## 2023-12-12 LAB — CBC WITH DIFFERENTIAL/PLATELET
Basophils Absolute: 0 10*3/uL (ref 0.0–0.2)
Basos: 0 %
EOS (ABSOLUTE): 0 10*3/uL (ref 0.0–0.4)
Eos: 0 %
Hematocrit: 34.6 % (ref 34.0–46.6)
Hemoglobin: 10.6 g/dL — ABNORMAL LOW (ref 11.1–15.9)
Immature Grans (Abs): 0 10*3/uL (ref 0.0–0.1)
Immature Granulocytes: 0 %
Lymphocytes Absolute: 1.2 10*3/uL (ref 0.7–3.1)
Lymphs: 41 %
MCH: 26.4 pg — ABNORMAL LOW (ref 26.6–33.0)
MCHC: 30.6 g/dL — ABNORMAL LOW (ref 31.5–35.7)
MCV: 86 fL (ref 79–97)
Monocytes Absolute: 0.2 10*3/uL (ref 0.1–0.9)
Monocytes: 6 %
Neutrophils Absolute: 1.5 10*3/uL (ref 1.4–7.0)
Neutrophils: 53 %
Platelets: 496 10*3/uL — ABNORMAL HIGH (ref 150–450)
RBC: 4.02 x10E6/uL (ref 3.77–5.28)
RDW: 14.4 % (ref 11.7–15.4)
WBC: 2.9 10*3/uL — ABNORMAL LOW (ref 3.4–10.8)

## 2023-12-12 LAB — LIPID PANEL
Chol/HDL Ratio: 2.4 ratio (ref 0.0–4.4)
Cholesterol, Total: 120 mg/dL (ref 100–199)
HDL: 50 mg/dL (ref >39)
LDL Chol Calc (NIH): 45 mg/dL (ref 0–99)
Triglycerides: 150 mg/dL — ABNORMAL HIGH (ref 0–149)
VLDL Cholesterol Cal: 25 mg/dL (ref 5–40)

## 2023-12-12 LAB — HEMOGLOBIN A1C
Est. average glucose Bld gHb Est-mCnc: 214 mg/dL
Hgb A1c MFr Bld: 9.1 % — ABNORMAL HIGH (ref 4.8–5.6)

## 2023-12-14 ENCOUNTER — Other Ambulatory Visit: Payer: Self-pay | Admitting: Nurse Practitioner

## 2023-12-14 DIAGNOSIS — E1165 Type 2 diabetes mellitus with hyperglycemia: Secondary | ICD-10-CM

## 2023-12-17 ENCOUNTER — Encounter: Payer: Self-pay | Admitting: Oncology

## 2023-12-17 ENCOUNTER — Encounter: Payer: Self-pay | Admitting: Gastroenterology

## 2023-12-17 NOTE — Telephone Encounter (Signed)
 Requested medication (s) are due for refill today: yes  Requested medication (s) are on the active medication list: yes  Last refill:  07/02/23  Future visit scheduled: no  Notes to clinic:   Medication not assigned to a protocol, review manually.      Requested Prescriptions  Pending Prescriptions Disp Refills   MOUNJARO  5 MG/0.5ML Pen [Pharmacy Med Name: Mounjaro  5 MG/0.5ML Subcutaneous Solution Pen-injector] 12 mL 0    Sig: INJECT ONE SYRINGEFUL INTO THE SKIN ONCE WEEKLY     Off-Protocol Failed - 12/17/2023 10:36 AM      Failed - Medication not assigned to a protocol, review manually.      Passed - Valid encounter within last 12 months    Recent Outpatient Visits           6 days ago Hypertension associated with type 2 diabetes mellitus (HCC)   [place] [facility] Aileen Alexanders, NP   1 week ago Hospital discharge follow-up   [place] [facility] Aileen Alexanders, NP   2 weeks ago Multilobar lung infiltrate   [place] [facility] [place], Lavelle Posey, NP   3 weeks ago Subacute cough   [place] [facility] [place], Sanjuan Crumbly T, NP   3 months ago Anxiety disorder, unspecified type   [place] [facility] Aileen Alexanders, NP       Future Appointments             In 1 month MacDiarmid, Geralyn Knee, MD [facility]

## 2023-12-20 ENCOUNTER — Ambulatory Visit: Payer: Self-pay | Admitting: Nurse Practitioner

## 2023-12-20 ENCOUNTER — Telehealth: Payer: Self-pay | Admitting: Physician Assistant

## 2023-12-20 ENCOUNTER — Encounter: Payer: Self-pay | Admitting: Nurse Practitioner

## 2023-12-20 ENCOUNTER — Ambulatory Visit: Admitting: Nurse Practitioner

## 2023-12-20 ENCOUNTER — Other Ambulatory Visit (INDEPENDENT_AMBULATORY_CARE_PROVIDER_SITE_OTHER)

## 2023-12-20 VITALS — BP 100/60 | HR 100 | Ht 59.0 in | Wt 106.2 lb

## 2023-12-20 DIAGNOSIS — R1310 Dysphagia, unspecified: Secondary | ICD-10-CM

## 2023-12-20 DIAGNOSIS — K760 Fatty (change of) liver, not elsewhere classified: Secondary | ICD-10-CM

## 2023-12-20 DIAGNOSIS — D631 Anemia in chronic kidney disease: Secondary | ICD-10-CM

## 2023-12-20 DIAGNOSIS — R760 Raised antibody titer: Secondary | ICD-10-CM | POA: Diagnosis not present

## 2023-12-20 DIAGNOSIS — R748 Abnormal levels of other serum enzymes: Secondary | ICD-10-CM

## 2023-12-20 DIAGNOSIS — K523 Indeterminate colitis: Secondary | ICD-10-CM

## 2023-12-20 DIAGNOSIS — R1011 Right upper quadrant pain: Secondary | ICD-10-CM | POA: Diagnosis not present

## 2023-12-20 DIAGNOSIS — D509 Iron deficiency anemia, unspecified: Secondary | ICD-10-CM

## 2023-12-20 DIAGNOSIS — R197 Diarrhea, unspecified: Secondary | ICD-10-CM

## 2023-12-20 LAB — CBC
HCT: 33.6 % — ABNORMAL LOW (ref 36.0–46.0)
Hemoglobin: 10.8 g/dL — ABNORMAL LOW (ref 12.0–15.0)
MCHC: 32.3 g/dL (ref 30.0–36.0)
MCV: 80.3 fl (ref 78.0–100.0)
Platelets: 343 10*3/uL (ref 150.0–400.0)
RBC: 4.19 Mil/uL (ref 3.87–5.11)
RDW: 15.7 % — ABNORMAL HIGH (ref 11.5–15.5)
WBC: 3.7 10*3/uL — ABNORMAL LOW (ref 4.0–10.5)

## 2023-12-20 LAB — COMPREHENSIVE METABOLIC PANEL WITH GFR
ALT: 14 U/L (ref 0–35)
AST: 17 U/L (ref 0–37)
Albumin: 3.8 g/dL (ref 3.5–5.2)
Alkaline Phosphatase: 138 U/L — ABNORMAL HIGH (ref 39–117)
BUN: 7 mg/dL (ref 6–23)
CO2: 24 meq/L (ref 19–32)
Calcium: 8.7 mg/dL (ref 8.4–10.5)
Chloride: 102 meq/L (ref 96–112)
Creatinine, Ser: 0.75 mg/dL (ref 0.40–1.20)
GFR: 82.89 mL/min (ref >60.00)
Glucose, Bld: 142 mg/dL — ABNORMAL HIGH (ref 70–99)
Potassium: 3.8 meq/L (ref 3.5–5.1)
Sodium: 134 meq/L — ABNORMAL LOW (ref 135–145)
Total Bilirubin: 0.4 mg/dL (ref 0.2–1.2)
Total Protein: 7 g/dL (ref 6.0–8.3)

## 2023-12-20 LAB — B12 AND FOLATE PANEL
Folate: 13 ng/mL (ref >5.9)
Vitamin B-12: 154 pg/mL — ABNORMAL LOW (ref 211–911)

## 2023-12-20 LAB — IBC + FERRITIN
Ferritin: 15.2 ng/mL (ref 10.0–291.0)
Iron: 36 ug/dL — ABNORMAL LOW (ref 42–145)
Saturation Ratios: 7.7 % — ABNORMAL LOW (ref 20.0–50.0)
TIBC: 467.6 ug/dL — ABNORMAL HIGH (ref 250.0–450.0)
Transferrin: 334 mg/dL (ref 212.0–360.0)

## 2023-12-20 NOTE — Telephone Encounter (Signed)
 Ok to fill today

## 2023-12-20 NOTE — Patient Instructions (Signed)
 Your provider has requested that you go to the basement level for lab work before leaving today. Press "B" on the elevator. The lab is located at the first door on the left as you exit the elevator.   You have been scheduled for a Barium Esophogram at [facility] (1st floor of the hospital) on 12/26/23 at 11 am. Please arrive 30 minutes prior to your appointment for registration. Make certain not to have anything to eat or drink 3 hours prior to your test. If you need to reschedule for any reason, please contact radiology at [phone] to do so. __________________________________________________________________ A barium swallow is an examination that concentrates on views of the esophagus. This tends to be a double contrast exam (barium and two liquids which, when combined, create a gas to distend the wall of the oesophagus) or single contrast (non-ionic iodine based). The study is usually tailored to your symptoms so a good history is essential. Attention is paid during the study to the form, structure and configuration of the esophagus, looking for functional disorders (such as aspiration, dysphagia, achalasia, motility and reflux) EXAMINATION You may be asked to change into a gown, depending on the type of swallow being performed. A radiologist and radiographer will perform the procedure. The radiologist will advise you of the type of contrast selected for your procedure and direct you during the exam. You will be asked to stand, sit or lie in several different positions and to hold a small amount of fluid in your mouth before being asked to swallow while the imaging is performed .In some instances you may be asked to swallow barium coated marshmallows to assess the motility of a solid food bolus. The exam can be recorded as a digital or video fluoroscopy procedure. POST PROCEDURE It will take 1-2 days for the barium to pass through your system. To facilitate this, it is important, unless  otherwise directed, to increase your fluids for the next 24-48hrs and to resume your normal diet.  This test typically takes about 30 minutes to perform. __________________________________________________________________________________   Your provider has ordered "Diatherix" stool testing for you. You have received a kit from our office today containing all necessary supplies to complete this test. Please carefully read the stool collection instructions provided in the kit before opening the accompanying materials. In addition, be sure there is a label providing your full name and date of birth on the "puritan opti-swab" tube that is supplied in the kit (if you do not see a label with this information on your test tube, please make us  aware before test collection!). After completing the test, you should secure the purtian tube into the specimen biohazard bag. The [facility] E-Req sheet (including date and time of specimen collection) should be placed into the outside pocket of the specimen biohazard bag and returned to the [place] lab (basement floor of [facility]) within 3 days of collection. Please make sure to give the specimen to a staff member at the lab. DO NOT leave the specimen on the counter.   If the specimen date and time (can be found in the upper right boxed portion of the sheet) are not filled out on the E-Req sheet, the test will NOT be performed.   _______________________________________________________  If your blood pressure at your visit was 140/90 or greater, please contact your primary care physician to follow up on this.  _______________________________________________________  If you are age 67 or older, your body mass index should  be between 23-30. Your Body mass index is 21.46 kg/m. If this is out of the aforementioned range listed, please consider follow up with your Primary Care Provider.  If you are age 20 or younger, your body mass  index should be between 19-25. Your Body mass index is 21.46 kg/m. If this is out of the aformentioned range listed, please consider follow up with your Primary Care Provider.   ________________________________________________________  The [facility] providers would like to encourage you to use MYCHART to communicate with providers for non-urgent requests or questions.  Due to long hold times on the telephone, sending your provider a message by [facility] may be a faster and more efficient way to get a response.  Please allow 48 business hours for a response.  Please remember that this is for non-urgent requests.  _______________________________________________________

## 2023-12-20 NOTE — Progress Notes (Signed)
 12/20/2023 MARGARET COCKERILL [id number] 1956-06-24   Chief Complaint: Colitis follow up, discuss future liver biopsy   History of Present Illness: Amy Hansen is a 68 year old female with a past medical history of anxiety, hypertension, coronary artery disease s/p stent placement 04/2023 on Plavix  and ASA, HFpEF, DM type II, restless leg syndrome, IDA, GERD and indeterminate colitis and elevated LFTs. She is known by Dr. General Kenner. She remains on Entyvio  every 4 weeks for indeterminate colitis, however, due to her recent hospital admission with acute respiratory failure secondary to bilateral pneumonia 5/2 - 12/01/2023, Entyvio  infusion last week was cancelled and was rescheduled to 5/28.   She presents today for further GI follow up. Prior to her hospital admission as noted above, she was passing soft stools daily and since receiving IV and oral antibiotics for pneumonia she has been passing 1 to 3 nonbloody loose stools daily. She has chronic RUQ pain and had acute generalized lower abdominal pain 12/06/2023 therefore she presented to the ED for further evaluation. WBC 3.8. Hg 10.5. Alk phos 145. Moderate urine Hg. CTAP with contrast showed right middle and bilateral lobe lung infiltrates, fatty liver without biliary ductal dilatation, post cholecystectomy physiology, a nonobstructing left renal stone and diverticulosis without evidence of diverticulitis. Ureteral colic was suspected, she received Morphine , Zofran  and IV fluids. Her abdominal pain improved and she was discharged home.   Her most recent colonoscopy was 12/26/2021 which showed mild inflammation to he transverse colon and a 4mm hyperplastic polyp was removed from the rectum. Biopsies showed inactive chronic colitis. A repeat colonoscopy in 2 years was recommended.   Next Entyvio  infusion is scheduled on Wed 5/28.  She is on Mounjaro .  She endorses having recurrent chronic dysphagia with solids, liquids and pills since she had a  CVA or TIA. EGD 04/2022 showed a normal esophagus.   She is followed by hepatologist Dr. Rochele Christmas and Duey Ghent NP at [facility] for elevated LFTs and positive AMA, started on Ursodiol 08/2023. A liver biopsy has been considered in the past but was deferred because she is on Plavix . She communicated with her cardiologist and she stated she would be able to hold Plavix  in order to proceed with a liver biopsy.  Prior work-up  Colonoscopy 05/03/2020: - Mild to moderate inflammation from anus to cecum with a normal appearing terminal ileum. Multiple biopsies taken.   A. SMALL BOWEL, TERMINAL ILEUM, BIOPSY:  - Focally active nonspecific ileitis  - Negative for features of chronicity or granulomas   B. COLON, RIGHT, BIOPSY:  - Patchy active nonspecific colitis with focal ulceration  - Focal microscopic aggregate of epithelioid histiocytes suggestive of a  microadenoma  - See comment   C. COLON, LEFT, BIOPSY:  - Patchy mildly active nonspecific colitis  - Negative for features of chronicity or granulomas  COMMENT:   A, B and C.   Taken together, the ileocolonic biopsies show patchy  active colitis with microgranulomas within the right colon.  Diagnostic  histologic features of idiopathic inflammatory bowel disease, including  lamina propria and basal lymphoplasmacytosis or crypt architectural  distortion are not present.  Differential diagnosis can include  infection, drug effect and evolving/early inflammatory bowel disease  (e.g. Crohn's disease).  AFB and GMS special stains are negative for  acid-fast bacilli and fungal organisms respectively.  Clinical-radiologic correlation and patient follow-up is suggested.     CTAP with contrast 04/21/2020: 1. Diffuse circumferential wall thickening of virtually all of the  colon, most notably at the level of the cecum and ascending colon, consistent with infectious or inflammatory colitis. 2. There is a punctate focus of gas within the  urinary bladder. Correlate for history of recent instrumentation.  Aortic Atherosclerosis     Colonoscopy 10/06/20 - The perianal and digital rectal examinations were normal. - The terminal ileum appeared normal. - Patchy inflammation characterized by erosions, granularity, mucus and aphthous ulcerations was found in the entire colon, most prominent in the left colon. Biopsies were taken with a cold forceps for histology. - Scattered medium-mouthed diverticula were found in the entire colon. - The exam was otherwise without abnormality.   1. Surgical [P], random right sided sites - CHRONIC ACTIVE COLITIS - NO GRANULOMATA, DYSPLASIA OR MALIGNANCY IDENTIFIED 2. Surgical [P], colon, transverse - CHRONIC ACTIVE COLITIS - NO GRANULOMATA, DYSPLASIA OR MALIGNANCY IDENTIFIED 3. Surgical [P], random left sided sites - CHRONIC ACTIVE COLITIS - NO GRANULOMATA, DYSPLASIA OR MALIGNANCY IDENTIFIED    Colonoscopy 12/26/21: - The perianal and digital rectal examinations were normal. - The terminal ileum appeared normal. - Multiple small-mouthed diverticula were found in the entire colon. - Localized mild inflammation characterized by erosions and erythema was found in the transverse colon in one very focal area. Biopsies were taken with a cold forceps for histology. - A 4 mm polyp was found in the distal rectum. The polyp was sessile. The polyp was removed with a cold biopsy forceps. Resection and retrieval were complete. - The exam was otherwise without abnormality in regards to inflammatory burden. No overt inflammation seen elsewhere. There was significant residual stool. The prep was adequate enough to assess for inflammatory changes but small or flat lesions may not have been appreciated in some portions of the colon. - Biopsies were taken with a cold forceps for histology in the right, transverse, and left colon.   1. Surgical [P], random right colon sites - COLONIC MUCOSA WITH NO SPECIFIC  HISTOPATHOLOGIC CHANGES - NEGATIVE FOR ACUTE INFLAMMATION, FEATURES OF CHRONICITY, GRANULOMAS OR DYSPLASIA 2. Surgical [P], colon, transverse - INACTIVE CHRONIC COLITIS, MANIFESTED ONLY BY FOCAL MILD ARCHITECTURAL CHANGES - NEGATIVE FOR GRANULOMAS OR DYSPLASIA 3. Surgical [P], random left colon sites - COLONIC MUCOSA WITH NO SPECIFIC HISTOPATHOLOGIC CHANGES - NEGATIVE FOR ACUTE INFLAMMATION, FEATURES OF CHRONICITY, GRANULOMAS OR DYSPLASIA 4. Surgical [P], colon, rectum, polyp (1) - HYPERPLASTIC POLYP    Entyvio  levels 01/17/22 - level of 7.7, AB undetectable - increased dosing to q 4 weeks this past summer- July   EGD 05/02/22: - The exam of the esophagus was otherwise normal. - Evidence of a Roux-en-Y gastrojejunostomy was found. The gastrojejunal anastomosis was characterized by 2 small areas of ulceration. - The gastric pouch was otherwise small but normal. - Biopsies were taken with a cold forceps for Helicobacter pylori testing. - The examined small bowel limb and blind loop were normal.   Fecal calprotectin 09/25/22 - 59   RUQ US  09/15/22 - normal   MRCP 10/30/22: IMPRESSION: Prior cholecystectomy.   Mild biliary ductal dilatation, with common bile duct measuring 12 mm. No radiographic evidence of choledocholithiasis, stricture, or other obstructing etiology.  CTAP with contrast 12/07/2023: FINDINGS: Lower chest: Moderate severity multifocal lingular, right middle lobe and bilateral lower lobe infiltrates are seen.   Hepatobiliary: There is diffuse fatty infiltration of the liver parenchyma. No focal liver abnormality is seen. Status post cholecystectomy. No biliary dilatation.   Pancreas: Unremarkable. No pancreatic ductal dilatation or surrounding inflammatory changes.   Spleen: Normal in size without  focal abnormality.   Adrenals/Urinary Tract: Adrenal glands are unremarkable. Kidneys are normal in size, without obstructing renal calculi, focal lesion,  or hydronephrosis. A 9 mm nonobstructing renal calculus is again seen within pole of the left kidney. A mild amount of air is seen within the lumen of a moderately distended urinary bladder.   Stomach/Bowel: There is a small hiatal hernia. Surgical sutures are seen throughout the gastric region with surgically anastomosed bowel present within the anterior aspect of the mid left abdomen. Appendix appears normal. No evidence of bowel wall thickening, distention, or inflammatory changes. Noninflamed diverticula are seen throughout the large bowel.   Vascular/Lymphatic: Aortic atherosclerosis. No enlarged abdominal or pelvic lymph nodes.   Reproductive: Status post hysterectomy. No adnexal masses.   Other: No abdominal wall hernia or abnormality. No abdominopelvic ascites.   Musculoskeletal: Moderate to marked severity degenerative changes are seen at the level of L2-L3. A chronic deformity is seen involving the superior endplate of the T12 vertebral body.   IMPRESSION: 1. Moderate severity multifocal areas of lingular, right middle lobe and bilateral lower lobe infiltrates. 2. Hepatic steatosis. 3. Evidence of prior cholecystectomy. 4. Nonobstructing left renal calculus. 5. Colonic diverticulosis. 6. Small hiatal hernia. 7. Aortic atherosclerosis.         Latest Ref Rng & Units 12/11/2023   10:29 AM 12/06/2023    8:03 PM 12/01/2023    5:17 AM  CBC  WBC 3.4 - 10.8 x10E3/uL 2.9  3.8  4.6   Hemoglobin 11.1 - 15.9 g/dL 16.1  09.6  04.5   Hematocrit 34.0 - 46.6 % 34.6  33.4  33.7   Platelets 150 - 450 x10E3/uL 496  733  671        Latest Ref Rng & Units 12/11/2023   10:29 AM 12/06/2023    8:03 PM 10/31/2023    2:41 PM  Hepatic Function  Total Protein 6.0 - 8.5 g/dL 5.9  6.3  6.6   Albumin 3.9 - 4.9 g/dL 3.1  2.4  4.0   AST 0 - 40 IU/L 48  41  30   ALT 0 - 32 IU/L 35  28  26   Alk Phosphatase 44 - 121 IU/L 187  145  251   Total Bilirubin 0.0 - 1.2 mg/dL 0.2  0.6  0.4    Bilirubin, Direct 0.00 - 0.40 mg/dL 4.09   8.11        Latest Ref Rng & Units 12/11/2023   10:29 AM 12/06/2023    8:03 PM 12/01/2023    5:17 AM  CMP  Glucose 70 - 99 mg/dL 914  782  956   BUN 8 - 27 mg/dL 5  13  10    Creatinine 0.57 - 1.00 mg/dL 2.13  0.86  5.78   Sodium 134 - 144 mmol/L 136  134  135   Potassium 3.5 - 5.2 mmol/L 4.0  4.1  3.5   Chloride 96 - 106 mmol/L 102  103  99   CO2 20 - 29 mmol/L 21  22  24    Calcium  8.7 - 10.3 mg/dL 8.6  7.9  8.5   Total Protein 6.0 - 8.5 g/dL 5.9  6.3    Total Bilirubin 0.0 - 1.2 mg/dL 0.2  0.6    Alkaline Phos 44 - 121 IU/L 187  145    AST 0 - 40 IU/L 48  41    ALT 0 - 32 IU/L 35  28       Past Medical  History:  Diagnosis Date   Acute on chronic heart failure with preserved ejection fraction (HFpEF) (HCC) 05/16/2018   AKI (acute kidney injury) (HCC) 02/05/2023   Anemia    Anxiety    Arthritis    Blood transfusion without reported diagnosis    Cataract    CHF (congestive heart failure) (HCC)    Chronic kidney disease    UTI, hematuria in urine   Colitis    Crohn's disease (HCC)    Depression    Diabetes (HCC)    Diverticulosis    Frequent headaches    Interstitial cystitis    Recurrent UTI    Restless leg syndrome    TIA (transient ischemic attack) 02/20/2021   Urinary frequency    Past Surgical History:  Procedure Laterality Date   bariatric bypass  2012   BIOPSY  05/03/2020   Procedure: BIOPSY;  Surgeon: Janel Medford, MD;  Location: [facility] ENDOSCOPY;  Service: Endoscopy;;   CARPAL TUNNEL RELEASE Right 2003   CARPAL TUNNEL RELEASE Right    2008   CHOLECYSTECTOMY  1975   COLONOSCOPY     COLONOSCOPY WITH PROPOFOL  N/A 05/03/2020   Procedure: COLONOSCOPY WITH PROPOFOL ;  Surgeon: Janel Medford, MD;  Location: [facility] ENDOSCOPY;  Service: Endoscopy;  Laterality: N/A;   CORONARY STENT INTERVENTION N/A 05/15/2023   Procedure: CORONARY STENT INTERVENTION;  Surgeon: Sammy Crisp, MD;  Location: [facility] INVASIVE CV LAB;   Service: Cardiovascular;  Laterality: N/A;   CYSTOSCOPY W/ RETROGRADES Bilateral 06/06/2015   Procedure: CYSTOSCOPY WITH RETROGRADE PYELOGRAM;  Surgeon: Christina Coyer, MD;  Location: [facility] ORS;  Service: Urology;  Laterality: Bilateral;   EYE SURGERY Left 07/06/2022   FL INJ LEFT KNEE CT ARTHROGRAM ([facility] HX) Left    1995   GASTRIC BYPASS  2010   HAND SURGERY Left 01/19/2021   Thumb   HEMORRHOID SURGERY  2013   KNEE ARTHROSCOPY Left 1996   LEFT HEART CATH AND CORONARY ANGIOGRAPHY Left 05/15/2023   Procedure: LEFT HEART CATH AND CORONARY ANGIOGRAPHY;  Surgeon: Sammy Crisp, MD;  Location: [facility] INVASIVE CV LAB;  Service: Cardiovascular;  Laterality: Left;   TONSILLECTOMY     TOTAL ABDOMINAL HYSTERECTOMY W/ BILATERAL SALPINGOOPHORECTOMY     Current Outpatient Medications on File Prior to Visit  Medication Sig Dispense Refill   albuterol  (PROVENTIL ) (2.5 MG/3ML) 0.083% nebulizer solution Take 3 mLs (2.5 mg total) by nebulization every 6 (six) hours as needed for wheezing or shortness of breath. 150 mL 3   albuterol  (VENTOLIN  HFA) 108 (90 Base) MCG/ACT inhaler Inhale 2 puffs into the lungs every 6 (six) hours as needed for wheezing or shortness of breath. 18 g 5   ALPRAZolam  (XANAX ) 0.5 MG tablet Take 1 tablet (0.5 mg total) by mouth 2 (two) times daily as needed for anxiety. Must last 15 days. 30 tablet 5   aspirin  EC 81 MG tablet Take 1 tablet (81 mg) by mouth once daily. Swallow whole.     clopidogrel  (PLAVIX ) 75 MG tablet Take 1 tablet (75 mg total) by mouth daily with breakfast. 90 tablet 3   estradiol  (ESTRACE ) 0.1 MG/GM vaginal cream Apply one pea-sized amount around the opening of the urethra 3 times weekly. (Patient taking differently: Place 1 Applicatorful vaginally 3 (three) times a week. Apply one pea-sized amount around the opening of the urethra 3 times weekly.) 42.5 g 12   furosemide  (LASIX ) 20 MG tablet Take 1 tablet (20 mg total) by mouth daily as needed (for swelling in the  legs  accompanied by a weight gain of 3 pounds overnight -- USE SPARINGLY). 45 tablet 1   isosorbide  mononitrate (IMDUR ) 30 MG 24 hr tablet Take 0.5 tablets (15 mg total) by mouth daily. 45 tablet 3   metoprolol  tartrate (LOPRESSOR ) 25 MG tablet Take 25 mg by mouth 2 (two) times daily.     MOUNJARO  5 MG/0.5ML Pen INJECT ONE SYRINGEFUL INTO THE SKIN ONCE WEEKLY 12 mL 0   Nebulizers (COMPRESSOR/NEBULIZER) MISC 1 each by Does not apply route as directed. 1 each 0   nystatin  cream (MYCOSTATIN ) Apply 1 Application topically 2 (two) times daily. 60 g 0   ondansetron  (ZOFRAN -ODT) 8 MG disintegrating tablet Take 1 tablet (8 mg total) by mouth every 8 (eight) hours as needed for nausea or vomiting. 60 tablet 1   pantoprazole  (PROTONIX ) 40 MG tablet Take 1 tablet (40 mg total) by mouth daily. 90 tablet 3   pramipexole  (MIRAPEX ) 1 MG tablet Take 1 tablet (1 mg total) by mouth at bedtime. 90 tablet 0   QUEtiapine  (SEROQUEL ) 300 MG tablet Take 1 tablet (300 mg total) by mouth at bedtime. 30 tablet 5   rOPINIRole  (REQUIP ) 1 MG tablet Take 1 tablet (1 mg total) by mouth 2 (two) times daily. 180 tablet 1   rosuvastatin  (CRESTOR ) 5 MG tablet TAKE 1 TABLET DAILY 90 tablet 1   sertraline  (ZOLOFT ) 100 MG tablet Take 1 tablet (100 mg total) by mouth daily. 30 tablet 5   ursodiol (ACTIGALL) 300 MG capsule Take 300 mg by mouth 2 (two) times daily. Take 2 capsules in the AM and 1 capsule in the PM     vedolizumab  (ENTYVIO ) 300 MG injection Inject 300 mg into the vein every 30 (thirty) days.     nitroGLYCERIN  (NITROSTAT ) 0.4 MG SL tablet Place 1 tablet (0.4 mg total) under the tongue every 5 (five) minutes as needed. (Patient not taking: Reported on 12/20/2023) 25 tablet 3   No current facility-administered medications on file prior to visit.   Allergies  Allergen Reactions   Avelox [Moxifloxacin Hcl In Nacl] Anaphylaxis   Bactrim  [Sulfamethoxazole -Trimethoprim ] Anaphylaxis   Ciprofloxacin  Other (See Comments)    Pt  states she was told never to take this as it is in the same family as Avelox.    Buspar  [Buspirone ] Other (See Comments)    hallucinations   Neomycin -Polymyxin-Dexameth Other (See Comments)    Burning in the eyes   Aquaphor [Lanolin-Petrolatum] Itching and Rash   Depakote [Divalproex Sodium] Other (See Comments)    Unknown Reaction   Imitrex  [Sumatriptan ] Other (See Comments)    Neck and shoulder pain   Stadol [Butorphanol] Rash   Current Medications, Allergies, Past Medical History, Past Surgical History, Family History and Social History were reviewed in [employer] record.  Review of Systems:   Constitutional: Negative for fever, sweats, chills or weight loss.  Respiratory: Negative for shortness of breath.   Cardiovascular: Negative for chest pain, palpitations and leg swelling.  Gastrointestinal: See HPI.  Musculoskeletal: Negative for back pain or muscle aches.  Neurological: Negative for dizziness, headaches or paresthesias.   Physical Exam: BP 100/60 (BP Location: Left Arm, Patient Position: Sitting, Cuff Size: Normal)   Pulse 100   Ht 4\' 11"  (1.499 m) Comment: height measured without shoes  Wt 106 lb 4 oz (48.2 kg)   LMP  (LMP Unknown)   BMI 21.46 kg/m  General: Thin 69 year old female in no acute distress. Head: Normocephalic and atraumatic. Eyes: No scleral icterus.  Conjunctiva pink . Ears: Normal auditory acuity. Mouth: No ulcers or lesions.  Lungs: Clear throughout to auscultation. Heart: Regular rate and rhythm, no murmur. Abdomen: Soft, nondistended. Mild central abdominal tenderness without rebound or guarding. No masses or hepatomegaly. Normal bowel sounds x 4 quadrants.  Rectal: Deferred.  Musculoskeletal: Symmetrical with no gross deformities. Extremities: No edema. Neurological: Alert oriented x 4. No focal deficits.  Psychological: Alert and cooperative. Normal mood and affect  Assessment and Recommendations:  Indeterminate  colitis on Entyvio  Q 4 weeks, loose stools since hospital admission for hypoxic respiratory failure secondary to bilateral pneumonia 5/2 - 5/4 treated with IV and oral antibiotics. Acute lower abdominal pain 2 weeks ago. CTAP 12/07/2023 showed diverticulosis without evidence of diverticulitis or colitis. Last Entyvio  infusion was 5 weeks ago, next infusion scheduled 12/25/2023. Her most recent colonoscopy was 12/26/2021 which showed mild inflammation to he transverse colon and a 4mm hyperplastic polyp was removed from the rectum. Biopsies showed inactive chronic colitis -[employer] C. Diff -Will need to rescheduled Entyvio  infusion in a few weeks if C. Diff positive  -Push fluids, bland diet  -She is due for a colonoscopy 11/2023, defer for now as she is recovering from bilateral pneumonia and C. Diff needs to be rule out  Chronically elevated LFTs with positive AMA level and hepatic steatosis per imaging, followed by hepatology at [facility]. Alk phos 187. AST 48. ALT 35. On Ursodiol since 08/2023. Prior MRCP 10/2022 showed mild bilary ductal dilatation. CTAP 12/07/2023 showed hepatic steatosis without biliary ductal dilatation.  -Patient to follow up with hepatologist Dr. Erlinda Haws, considering a liver biopsy if cardiology approves Plavix  hold  -CMP  Dysphagia, chronic. EGD 04/2022 showed a normal esophagus.  -Barium swallow study with tablet  CAD s/p stent placement 04/2023 on Plavix  and ASA  Bilateral pneumonia, treated with IV and oral antibiotics including a course of Doxycycline  when discharged from the hospital. Respiratory status stable. CT 5/10 showed moderate severity multifocal areas of lingular, right middle lobe and bilateral lower lobe infiltrates. -Continue follow up with PCP  Chronic anemia secondary to CKD, past Roue-en-Y gastric bypass surgery with marginal ulcers. On PPI every day. -CBC, IBC + ferritin, B12 and folate  -Continue Pantoprazole  40mg  QD  Hyperplastic rectal  polyp per colonoscopy 11/2021

## 2023-12-20 NOTE — Telephone Encounter (Signed)
 Pt is asking for early RF of her alprazolam . Pharmacy said she picked up 5/10, is 2 days too early.   She was in the hospital for 2 days for respiratory failure and then had an ED visit for abdominal pain. She was prescribed #10 hydrocodone  from the ED visit on 5/9.

## 2023-12-20 NOTE — Telephone Encounter (Signed)
 Amy Hansen called at 9:25 to request approval of early refill of her Xanax .  She was admitted to the hospital for double pneumonia and and is home but having to do breathing treatments every 6 hrs.  She has taken extra Xanax  during this ordeal so she is out early and doesn't have any at all.  She has a refill at the pharmacy but it will need to be approved for early refill.  Will you please authorize the refill?  Next appt 8/8.  Please let her know if you will ok this.   [facility] - [place], [place] - [address]

## 2023-12-20 NOTE — Telephone Encounter (Signed)
Authorized early RF. Patient notified.

## 2023-12-20 NOTE — Telephone Encounter (Signed)
 12/07/2023 12/07/2023 1  Hydrocodone -Acetamin 5-325 Mg 10.00 2 Un Hos [id number] Sou [phone]) 0/0 25.00 MME Comm Ins  12/07/2023 10/04/2023 1  Alprazolam  0.5 Mg Tablet 30.00 15

## 2023-12-21 ENCOUNTER — Encounter: Payer: Self-pay | Admitting: Nurse Practitioner

## 2023-12-22 MED ORDER — FERROUS SULFATE 325 (65 FE) MG PO TABS: 325.0000 mg | ORAL_TABLET | Freq: Every day | ORAL | 2 refills | Status: DC

## 2023-12-23 NOTE — Progress Notes (Signed)
 Agree with assessment and plan as outlined.

## 2023-12-24 ENCOUNTER — Telehealth: Payer: Self-pay | Admitting: Nurse Practitioner

## 2023-12-24 NOTE — Telephone Encounter (Signed)
 Refer to to office visit 12/20/2023. I called patient for sx update. She has not completed the C. Diff stool test. She continues to pass 3 to 4 nonbloody loose stools daily. Since her OV 5/23, she was prescribed another antibiotic for a toe infection. She is still recovering from her recent hospital admission. I advised patient to cancel her Entyvio  infusion scheduled 5/28, complete the C. Diff stool test within the next day or two and reschedule Entyvio  infusion in one week.

## 2023-12-25 ENCOUNTER — Ambulatory Visit

## 2023-12-25 DIAGNOSIS — R197 Diarrhea, unspecified: Secondary | ICD-10-CM | POA: Diagnosis not present

## 2023-12-26 ENCOUNTER — Inpatient Hospital Stay (HOSPITAL_COMMUNITY): Admission: RE | Admit: 2023-12-26 | Source: Ambulatory Visit

## 2023-12-30 ENCOUNTER — Ambulatory Visit: Payer: Self-pay

## 2023-12-30 ENCOUNTER — Ambulatory Visit: Admitting: Nurse Practitioner

## 2023-12-30 ENCOUNTER — Telehealth: Payer: Self-pay

## 2023-12-30 DIAGNOSIS — R413 Other amnesia: Secondary | ICD-10-CM

## 2023-12-30 DIAGNOSIS — Z8673 Personal history of transient ischemic attack (TIA), and cerebral infarction without residual deficits: Secondary | ICD-10-CM

## 2023-12-30 DIAGNOSIS — R748 Abnormal levels of other serum enzymes: Secondary | ICD-10-CM | POA: Diagnosis not present

## 2023-12-30 NOTE — Telephone Encounter (Signed)
 Called and spoke to patient in regards to scheduled appointment. Discussed with patient that we cannot adjust her mental health medications as she follows with psychiatry per Jolene and Mariah Shines. Patient states she has not reached out to them yet but will. Patient ok'd to have today's appointment cancelled with us .   While on the phone, patient states that she would like to be referred to a new neurologist. Currently a patient at [facility] with Dr. Walden Guise and his assistant. Patient states that she feels like her health is being managed appropriately by them. States she is not being monitored like she feels like she should be with her history of strokes. Patient does not want to see Dr. Walden Guise or Dr. Mason Sole, but would like referral elsewhere.

## 2023-12-30 NOTE — Telephone Encounter (Signed)
 Patient notified of referral. Advised patient that she should receive a call to be scheduled. Also advised patient to be on the lookout for a letter in her mychart from out referral team with the details on her referral.

## 2023-12-30 NOTE — Telephone Encounter (Signed)
 Copied from CRM [id number]. Topic: Clinical - Red Word Triage >> Dec 30, 2023  8:38 AM Jenice Mitts wrote: Red Word that prompted transfer to Nurse Triage: Mental Health   Chief Complaint: Mental Health Symptoms: depression, difficulty sleeping, difficulty eating, difficulty doing daily tasks, thoughts this weekend about suicidal ideations. Frequency: x about a week Pertinent Negatives: Patient denies difficulty breathing, chest pain, nausea, vomiting, fever,  Disposition: [] ED /[] Urgent Care (no appt availability in office) / [] Appointment(In office/virtual)/ []  [facility]/ [] Home Care/ [] Refused Recommended Disposition /[]  Mobile Bus/ []  Follow-up with PCP Additional Notes: Patient called and advised that she has had a rough week with her stomach and she wanted to come in and see her PCP to talk about her issues.  [facility]. She states that she is "just not in a good place right now". She hasn't been able to see her psychiatrist. She states that she has been admitted in the past month for double pneumonia and was only able to see her psychiatrist via video. Patient started talking about her medical conditions that she has had over the past few years---strokes, heart issues, and having to be put in ICU on an Insulin  drip due to her blood sugar being too high. Patient states her weight has gone from 119 to 102 in about a month--being sick she states. She advised that she had thoughts about harming herself this past weekend by "taking medication and going to sleep" Patient states that her husband is there with her and her daughter is also there. Patient denies any thoughts about harming herself at this present moment while talking to this RN. Patient states that she just wants to see someone for some help with her anxiety about being sick with different illnesses. Patient feels like a burden for the past few years with all her sicknesses.  Patient  states that she is just going to run some errands today and she states that the appointment at 3 PM is fine. She states that her husband is planning on taking her to run these errands. Patient states that she is able to function but she just feels like her anxiety and depression are getting worse.  Patient is advised that her PCP Aileen Alexanders, NP doesn't have any appointments open today and this RN advised her that it would be best for her to be seen today and patient agreed. She states that she has seen Jolene before and she advised that Jolene has an opening at 3 PM today 12/30/2023.  Patient agrees to this time slot.  This RN went over resources with the patient and she was familiar with [phone] and understood to call them if needed as well. Patient is advised that if anything gets worse or she starts to have any thoughts about harming herself or any negative thoughts to call us  back or go to the Emergency Room to be evaluated, have support, and to make sure that nothing else is going on. Patient verbalized understanding of this.      Reason for Disposition  [1] Depression AND [2] worsening (e.g., sleeping poorly, less able to do activities of daily living)  Answer Assessment - Initial Assessment Questions 1. CONCERN: "What happened that made you call today?"     Recent illnesses causing depression 2. DEPRESSION SYMPTOM SCREENING: "How are you feeling overall?" (e.g., decreased energy, increased sleeping or difficulty sleeping, difficulty concentrating, feelings of sadness, guilt, hopelessness, or worthlessness)     Very  depressed  3. RISK OF HARM - SUICIDAL IDEATION:  "Do you ever have thoughts of hurting or killing yourself?"  (e.g., yes, no, no but preoccupation with thoughts about death)   - INTENT:  "Do you have thoughts of hurting or killing yourself right NOW?" (e.g., yes, no, N/A)   - PLAN: "Do you have a specific plan for how you would do this?" (e.g., gun, knife, overdose, no plan,  N/A)     Yes---"taking medication and going to bed" 4. RISK OF HARM - HOMICIDAL IDEATION:  "Do you ever have thoughts of hurting or killing someone else?"  (e.g., yes, no, no but preoccupation with thoughts about death)   - INTENT:  "Do you have thoughts of hurting or killing someone right NOW?" (e.g., yes, no, N/A)   - PLAN: "Do you have a specific plan for how you would do this?" (e.g., gun, knife, no plan, N/A)      Patient denies 5. FUNCTIONAL IMPAIRMENT: "How have things been going for you overall? Have you had more difficulty than usual doing your normal daily activities?"  (e.g., better, same, worse; self-care, school, work, interactions)     Recent illnesses piling up 6. SUPPORT: "Who is with you now?" "Who do you live with?" "Do you have family or friends who you can talk to?"      She states that she has one friend that she saw last night-- 7. THERAPIST: "Do you have a counselor or therapist? Name?"     [facility] in [place] with them about two weeks ago 8. STRESSORS: "Has there been any new stress or recent changes in your life?"     Recent illnesses 9. ALCOHOL USE OR SUBSTANCE USE (DRUG USE): "Do you drink alcohol or use any illegal drugs?"     ---- 10. OTHER: "Do you have any other physical symptoms right now?" (e.g., fever)       ----  Protocols used: Depression-A-AH

## 2023-12-30 NOTE — Telephone Encounter (Signed)
 The next option would be [facility].  I placed the referral. Please let her know that it will be sent there.

## 2023-12-31 ENCOUNTER — Telehealth: Payer: Self-pay | Admitting: Internal Medicine

## 2023-12-31 NOTE — Telephone Encounter (Signed)
 Pt c/o medication issue:  1. Name of Medication:   clopidogrel  (PLAVIX ) 75 MG tablet    2. How are you currently taking this medication (dosage and times per day)?   3. Are you having a reaction (difficulty breathing--STAT)?   4. What is your medication issue? Pt called in stating she has not been taking this medication for a few weeks (around May) because she some bleeding issues. She asked if she can go ahead have this discontinued. She states she was only supposed to be on it for 6 months after stent and she stated she is still taking aspirin . Please advise.

## 2023-12-31 NOTE — Telephone Encounter (Signed)
 The patient called stating that due to recently being sick, she has been experiencing issues with swallowing. She reported that she stopped taking many of her oral medications but is now adding them back with applesauce. However, the patient noted that she was only supposed to take Plavix  for 6 months and stopped taking it in May when she became sick. The patient is questioning whether it is okay to discontinue the medication, as she stated she doesn't want to take any medication that isn't necessary.  Will forward to [facility] for recommendations.

## 2023-12-31 NOTE — Telephone Encounter (Signed)
 As it has been more than 6 months since her PCI, it is fine to stop clopidogrel  and continue single antiplatelet therapy with aspirin  81 mg daily.  Sammy Crisp, MD [facility]

## 2023-12-31 NOTE — Telephone Encounter (Signed)
Pt made aware and verbalized understanding Medication list updated

## 2024-01-02 ENCOUNTER — Encounter: Payer: Self-pay | Admitting: Oncology

## 2024-01-02 ENCOUNTER — Encounter: Payer: Self-pay | Admitting: Physician Assistant

## 2024-01-02 ENCOUNTER — Ambulatory Visit (INDEPENDENT_AMBULATORY_CARE_PROVIDER_SITE_OTHER): Admitting: Physician Assistant

## 2024-01-02 ENCOUNTER — Encounter: Payer: Self-pay | Admitting: Gastroenterology

## 2024-01-02 DIAGNOSIS — G2581 Restless legs syndrome: Secondary | ICD-10-CM

## 2024-01-02 DIAGNOSIS — F411 Generalized anxiety disorder: Secondary | ICD-10-CM

## 2024-01-02 DIAGNOSIS — F319 Bipolar disorder, unspecified: Secondary | ICD-10-CM

## 2024-01-02 DIAGNOSIS — G4700 Insomnia, unspecified: Secondary | ICD-10-CM

## 2024-01-02 DIAGNOSIS — F1320 Sedative, hypnotic or anxiolytic dependence, uncomplicated: Secondary | ICD-10-CM

## 2024-01-02 NOTE — Progress Notes (Signed)
 [facility] Med Check  Patient ID: Amy Hansen,  MRN: [id number]  PCP: Aileen Alexanders, NP  Date of Evaluation: 01/02/2024 Time spent:20 minutes  Chief Complaint:  Chief Complaint   Anxiety; Depression; Follow-up    HISTORY/CURRENT STATUS: For routine med check.   Since she had pneumonia last month plus a kidney stone, she has felt more sad.  There's discord in the family; one of the granddaughters left her husband and their 67 yo son who is autistic.  That breaks her heart.  The granddaughter is also mad because they have political differences and she won't have anything to do w/ Elinda now.  Patient is able to enjoy some things.  Energy and motivation are fair.  No extreme sadness, tearfulness, or feelings of hopelessness.  Sleeps well most of the time. ADLs and personal hygiene are normal.   Denies any changes in concentration, making decisions, or remembering things.  Appetite has not changed.  Weight is stable.  Anxiety is well-controlled. The Xanax  is helpful.  When she used the Albuterol  for the pneumonia she was more anxious and took 3 of Xanax  a few days. Denies suicidal or homicidal thoughts.  Patient denies increased energy with decreased need for sleep, increased talkativeness, racing thoughts, impulsivity or risky behaviors, increased spending, increased libido, grandiosity, increased irritability or anger, paranoia, or hallucinations.  Denies dizziness, syncope, seizures, numbness, tingling, tremor, tics, unsteady gait, slurred speech, confusion. Denies muscle or joint pain, stiffness, or dystonia. Denies unexplained weight loss, frequent infections, or sores that heal slowly.  No polyphagia, polydipsia, or polyuria. Denies visual changes or paresthesias.   Individual Medical History/ Review of Systems: Changes? :Yes   passed a kidney stone, went to ER 12/06/2023  Past medications for mental health diagnoses include: Trazodone , Risperdal , Zoloft , Lunesta, prazosin, Sonata,  Prozac, Depakote, Lamictal , lithium, Wellbutrin , Xanax , Ambien , carbamazepine , Seroquel , Buspar  caused falls and hallucinations, Gabapentin -she doesn't want to take  Allergies: Avelox [moxifloxacin hcl in nacl], Bactrim  [sulfamethoxazole -trimethoprim ], Ciprofloxacin , Buspar  [buspirone ], Neomycin -polymyxin-dexameth, Aquaphor [lanolin-petrolatum], Depakote [divalproex sodium], Imitrex  [sumatriptan ], and Stadol [butorphanol]  Current Medications:  Current Outpatient Medications:    albuterol  (PROVENTIL ) (2.5 MG/3ML) 0.083% nebulizer solution, Take 3 mLs (2.5 mg total) by nebulization every 6 (six) hours as needed for wheezing or shortness of breath., Disp: 150 mL, Rfl: 3   albuterol  (VENTOLIN  HFA) 108 (90 Base) MCG/ACT inhaler, Inhale 2 puffs into the lungs every 6 (six) hours as needed for wheezing or shortness of breath., Disp: 18 g, Rfl: 5   ALPRAZolam  (XANAX ) 0.5 MG tablet, Take 1 tablet (0.5 mg total) by mouth 2 (two) times daily as needed for anxiety. Must last 15 days., Disp: 30 tablet, Rfl: 5   aspirin  EC 81 MG tablet, Take 1 tablet (81 mg) by mouth once daily. Swallow whole., Disp: , Rfl:    estradiol  (ESTRACE ) 0.1 MG/GM vaginal cream, Apply one pea-sized amount around the opening of the urethra 3 times weekly. (Patient taking differently: Place 1 Applicatorful vaginally 3 (three) times a week. Apply one pea-sized amount around the opening of the urethra 3 times weekly.), Disp: 42.5 g, Rfl: 12   ferrous sulfate  325 (65 FE) MG tablet, Take 1 tablet (325 mg total) by mouth daily with breakfast., Disp: 30 tablet, Rfl: 2   furosemide  (LASIX ) 20 MG tablet, Take 1 tablet (20 mg total) by mouth daily as needed (for swelling in the legs accompanied by a weight gain of 3 pounds overnight -- USE SPARINGLY)., Disp: 45 tablet, Rfl: 1   metoprolol  tartrate (  LOPRESSOR ) 25 MG tablet, Take 25 mg by mouth 2 (two) times daily., Disp: , Rfl:    MOUNJARO  5 MG/0.5ML Pen, INJECT ONE SYRINGEFUL INTO THE SKIN ONCE  WEEKLY, Disp: 12 mL, Rfl: 0   Nebulizers (COMPRESSOR/NEBULIZER) MISC, 1 each by Does not apply route as directed., Disp: 1 each, Rfl: 0   nystatin  cream (MYCOSTATIN ), Apply 1 Application topically 2 (two) times daily., Disp: 60 g, Rfl: 0   ondansetron  (ZOFRAN -ODT) 8 MG disintegrating tablet, Take 1 tablet (8 mg total) by mouth every 8 (eight) hours as needed for nausea or vomiting., Disp: 60 tablet, Rfl: 1   pantoprazole  (PROTONIX ) 40 MG tablet, Take 1 tablet (40 mg total) by mouth daily., Disp: 90 tablet, Rfl: 3   pramipexole  (MIRAPEX ) 1 MG tablet, Take 1 tablet (1 mg total) by mouth at bedtime., Disp: 90 tablet, Rfl: 0   QUEtiapine  (SEROQUEL ) 300 MG tablet, Take 1 tablet (300 mg total) by mouth at bedtime., Disp: 30 tablet, Rfl: 5   rOPINIRole  (REQUIP ) 1 MG tablet, Take 1 tablet (1 mg total) by mouth 2 (two) times daily., Disp: 180 tablet, Rfl: 1   rosuvastatin  (CRESTOR ) 5 MG tablet, TAKE 1 TABLET DAILY, Disp: 90 tablet, Rfl: 1   sertraline  (ZOLOFT ) 100 MG tablet, Take 1 tablet (100 mg total) by mouth daily., Disp: 30 tablet, Rfl: 5   ursodiol (ACTIGALL) 300 MG capsule, Take 300 mg by mouth 2 (two) times daily. Take 2 capsules in the AM and 1 capsule in the PM, Disp: , Rfl:    vedolizumab  (ENTYVIO ) 300 MG injection, Inject 300 mg into the vein every 30 (thirty) days., Disp: , Rfl:    isosorbide  mononitrate (IMDUR ) 30 MG 24 hr tablet, Take 0.5 tablets (15 mg total) by mouth daily., Disp: 45 tablet, Rfl: 3   nitroGLYCERIN  (NITROSTAT ) 0.4 MG SL tablet, Place 1 tablet (0.4 mg total) under the tongue every 5 (five) minutes as needed. (Patient not taking: Reported on 12/20/2023), Disp: 25 tablet, Rfl: 3 Medication Side Effects: none  Family Medical/ Social History: Changes?  no  MENTAL HEALTH EXAM:  There were no vitals taken for this visit.There is no height or weight on file to calculate BMI.  General Appearance: Casual and Well Groomed  Eye Contact:  Good  Speech:  Clear and Coherent and Normal  Rate  Volume:  Normal  Mood:  Euthymic  Affect:  Congruent  Thought Process:  Goal Directed and Descriptions of Associations: Circumstantial  Orientation:  Full (Time, Place, and Person)  Thought Content: Logical   Suicidal Thoughts:  No  Homicidal Thoughts:  No  Memory:  at her baseline  Judgement:  Good  Insight:  Good  Psychomotor Activity:  Normal  Concentration:  Concentration: Good and Attention Span: Good  Recall:  Good  Fund of Knowledge: Good  Language: Good  Assets:  Communication Skills Desire for Improvement Financial Resources/Insurance Housing Resilience Social Support Transportation   ADL's:  Intact  Cognition: WNL  Prognosis:  Good   PCP follows labs  DIAGNOSES:    ICD-10-CM   1. Generalized anxiety disorder  F41.1     2. Bipolar I disorder (HCC)  F31.9     3. Insomnia, unspecified type  G47.00     4. Restless legs syndrome (RLS)  G25.81     5. Benzodiazepine dependence (HCC)  F13.20      Receiving Psychotherapy: No   RECOMMENDATIONS:  PDMP reviewed.  Xanax  filled 12/20/2023.  Hydrocodone  given 12/07/2023. I provided 20 minutes of  face to face time during this encounter, including time spent before and after the visit in records review, medical decision making, counseling pertinent to today's visit, and charting.   She's stable for the most part.  The albuterol  caused increase anxiety, but since she's not needing that as much, the anxiety isn't as bad. We agree to make no changes in meds.   Continue Xanax  0.5 mg, 1 p.o. twice daily as needed.  DON'T FILL EARLY. EVER.   Continue pramipexole  1 mg, 1 p.o. nightly. Continue Seroquel  300 mg, 1 at bedtime.  Continue ropinirole  1 mg, 1 p.o. twice daily. Continue Zoloft  100 mg p.o. daily. Return in 3 months.    Marvia Slocumb, PA-C

## 2024-01-08 ENCOUNTER — Encounter: Payer: Self-pay | Admitting: Podiatry

## 2024-01-08 ENCOUNTER — Ambulatory Visit: Admitting: Podiatry

## 2024-01-08 DIAGNOSIS — L600 Ingrowing nail: Secondary | ICD-10-CM | POA: Diagnosis not present

## 2024-01-09 ENCOUNTER — Ambulatory Visit (INDEPENDENT_AMBULATORY_CARE_PROVIDER_SITE_OTHER)

## 2024-01-09 VITALS — BP 115/74 | HR 78 | Temp 97.9°F | Resp 20 | Ht 59.0 in | Wt 112.6 lb

## 2024-01-09 DIAGNOSIS — K5100 Ulcerative (chronic) pancolitis without complications: Secondary | ICD-10-CM | POA: Diagnosis not present

## 2024-01-09 MED ORDER — VEDOLIZUMAB 300 MG IV SOLR
300.0000 mg | Freq: Once | INTRAVENOUS | Status: AC
  Administered 2024-01-09: 300 mg via INTRAVENOUS
  Filled 2024-01-09: qty 5

## 2024-01-09 NOTE — Progress Notes (Signed)
 Diagnosis: Pancolitis  Provider:  Mannam, Praveen MD  Procedure: IV Infusion  IV Type: Peripheral, IV Location: L Antecubital  Entyvio  (Vedolizumab ), Dose: 300 mg  Infusion Start Time: 0934  Infusion Stop Time: 1010  Post Infusion IV Care: Peripheral IV Discontinued  Discharge: Condition: Good, Destination: Home . AVS Provided  Performed by:  Lendel Quant, RN

## 2024-01-10 DIAGNOSIS — G459 Transient cerebral ischemic attack, unspecified: Secondary | ICD-10-CM | POA: Diagnosis not present

## 2024-01-10 DIAGNOSIS — R519 Headache, unspecified: Secondary | ICD-10-CM | POA: Diagnosis not present

## 2024-01-10 DIAGNOSIS — R4781 Slurred speech: Secondary | ICD-10-CM | POA: Diagnosis not present

## 2024-01-10 DIAGNOSIS — M542 Cervicalgia: Secondary | ICD-10-CM | POA: Diagnosis not present

## 2024-01-12 NOTE — Progress Notes (Signed)
 Chief Complaint  Patient presents with   Ingrown Toenail    RM#9 Right great toe ingrown showing signs of redness patient is concerned due to her being a diabetic. Symptoms have been present for two weeks.    Subjective: Patient presents today for evaluation of pain to the right great toe. Patient is concerned for possible ingrown nail.  It is very sensitive to touch.  Patient presents today for further treatment and evaluation.  Past Medical History:  Diagnosis Date   Acute on chronic heart failure with preserved ejection fraction (HFpEF) (HCC) 05/16/2018   AKI (acute kidney injury) (HCC) 02/05/2023   Anemia    Anxiety    Arthritis    Blood transfusion without reported diagnosis    Cataract    CHF (congestive heart failure) (HCC)    Chronic kidney disease    UTI, hematuria in urine   Colitis    Crohn's disease (HCC)    Depression    Diabetes (HCC)    Diverticulosis    Frequent headaches    Interstitial cystitis    Recurrent UTI    Restless leg syndrome    TIA (transient ischemic attack) 02/20/2021   Urinary frequency     Past Surgical History:  Procedure Laterality Date   bariatric bypass  2012   BIOPSY  05/03/2020   Procedure: BIOPSY;  Surgeon: Janel Medford, MD;  Location: [facility] ENDOSCOPY;  Service: Endoscopy;;   CARPAL TUNNEL RELEASE Right 2003   CARPAL TUNNEL RELEASE Right    2008   CHOLECYSTECTOMY  1975   COLONOSCOPY     COLONOSCOPY WITH PROPOFOL  N/A 05/03/2020   Procedure: COLONOSCOPY WITH PROPOFOL ;  Surgeon: Janel Medford, MD;  Location[facility] ENDOSCOPY;  Service: Endoscopy;  Laterality: N/A;   CORONARY STENT INTERVENTION N/A 05/15/2023   Procedure: CORONARY STENT INTERVENTION;  Surgeon: Sammy Crisp, MD;  Location: [facility] INVASIVE CV LAB;  Service: Cardiovascular;  Laterality: N/A;   CYSTOSCOPY W/ RETROGRADES Bilateral 06/06/2015   Procedure: CYSTOSCOPY WITH RETROGRADE PYELOGRAM;  Surgeon: Christina Coyer, MD;  Location: [facility] ORS;  Service: Urology;   Laterality: Bilateral;   EYE SURGERY Left 07/06/2022   FL INJ LEFT KNEE CT ARTHROGRAM ([facility] HX) Left    1995   GASTRIC BYPASS  2010   HAND SURGERY Left 01/19/2021   Thumb   HEMORRHOID SURGERY  2013   KNEE ARTHROSCOPY Left 1996   LEFT HEART CATH AND CORONARY ANGIOGRAPHY Left 05/15/2023   Procedure: LEFT HEART CATH AND CORONARY ANGIOGRAPHY;  Surgeon: Sammy Crisp, MD;  Location: [facility] INVASIVE CV LAB;  Service: Cardiovascular;  Laterality: Left;   TONSILLECTOMY     TOTAL ABDOMINAL HYSTERECTOMY W/ BILATERAL SALPINGOOPHORECTOMY      Allergies  Allergen Reactions   Avelox [Moxifloxacin Hcl In Nacl] Anaphylaxis   Bactrim  [Sulfamethoxazole -Trimethoprim ] Anaphylaxis   Ciprofloxacin  Other (See Comments)    Pt states she was told never to take this as it is in the same family as Avelox.    Buspar  [Buspirone ] Other (See Comments)    hallucinations   Neomycin -Polymyxin-Dexameth Other (See Comments)    Burning in the eyes   Aquaphor [Lanolin-Petrolatum] Itching and Rash   Depakote [Divalproex Sodium] Other (See Comments)    Unknown Reaction   Imitrex  [Sumatriptan ] Other (See Comments)    Neck and shoulder pain   Stadol [Butorphanol] Rash    Objective:  General: Well developed, nourished, in no acute distress, alert and oriented x3   Dermatology: Skin is warm, dry and supple bilateral.  Right  great toe is tender with evidence of an ingrowing nail. Pain on palpation noted to the border of the nail fold. The remaining nails appear unremarkable at this time.   Vascular: DP and PT pulses palpable.  No clinical evidence of vascular compromise  Neruologic: Grossly intact via light touch bilateral.  Musculoskeletal: No pedal deformity noted  Assesement: #1 Paronychia with ingrowing nail right great toe  Plan of Care:  -Patient evaluated.  -Discussed treatment alternatives and plan of care. Explained nail avulsion procedure and post procedure course to patient. - For now we are going  to pursue conservative treatment and simple debridement of the nail to see if this alleviates her symptoms.  The offending border of the nail plate was debrided.  The patient felt immediate relief -Return to clinic next scheduled appointment for routine footcare  Dot Gazella, DPM [facility]  Dr. Dot Gazella, DPM    [address] [place], [place] [address]                Office [phone]  Fax [phone]

## 2024-01-13 ENCOUNTER — Telehealth: Payer: Self-pay | Admitting: Nurse Practitioner

## 2024-01-13 DIAGNOSIS — K080 Exfoliation of teeth due to systemic causes: Secondary | ICD-10-CM | POA: Diagnosis not present

## 2024-01-13 NOTE — Telephone Encounter (Signed)
 Called and Left detailed message for patient with neg C Diff results. Scheduled her for a F/U appointment on 7-11 with Dr. General Kenner bc Dr. Tena Feeling schedule is so full. Asked her to call back to reschedule if this date does not work for her. Let her know his next appointment at this time is late August.

## 2024-01-13 NOTE — Telephone Encounter (Signed)
 Amy Hansen, pls contact patient and let her know with [facility] C. Diff test was negative. Please schedule her for a follow up appointment with Dr. General Kenner in 2 months and he can decide timing of next colonoscopy (patient was due for a colonoscopy 11/2023 but was deferred due to hospital admission with bilateral pneumonia which she was still recovering from when I saw her in office 12/20/2023. THX.

## 2024-01-16 ENCOUNTER — Encounter: Payer: Self-pay | Admitting: Nurse Practitioner

## 2024-01-16 ENCOUNTER — Ambulatory Visit (INDEPENDENT_AMBULATORY_CARE_PROVIDER_SITE_OTHER): Admitting: Nurse Practitioner

## 2024-01-16 ENCOUNTER — Telehealth: Payer: Self-pay | Admitting: Nurse Practitioner

## 2024-01-16 VITALS — BP 112/69 | HR 89 | Ht 59.0 in | Wt 112.0 lb

## 2024-01-16 DIAGNOSIS — I152 Hypertension secondary to endocrine disorders: Secondary | ICD-10-CM | POA: Diagnosis not present

## 2024-01-16 DIAGNOSIS — F419 Anxiety disorder, unspecified: Secondary | ICD-10-CM

## 2024-01-16 DIAGNOSIS — E1159 Type 2 diabetes mellitus with other circulatory complications: Secondary | ICD-10-CM | POA: Diagnosis not present

## 2024-01-16 DIAGNOSIS — I25110 Atherosclerotic heart disease of native coronary artery with unstable angina pectoris: Secondary | ICD-10-CM

## 2024-01-16 DIAGNOSIS — F1320 Sedative, hypnotic or anxiolytic dependence, uncomplicated: Secondary | ICD-10-CM | POA: Diagnosis not present

## 2024-01-16 DIAGNOSIS — Z7985 Long-term (current) use of injectable non-insulin antidiabetic drugs: Secondary | ICD-10-CM

## 2024-01-16 DIAGNOSIS — E1165 Type 2 diabetes mellitus with hyperglycemia: Secondary | ICD-10-CM | POA: Diagnosis not present

## 2024-01-16 DIAGNOSIS — E119 Type 2 diabetes mellitus without complications: Secondary | ICD-10-CM

## 2024-01-16 MED ORDER — TIRZEPATIDE 7.5 MG/0.5ML ~~LOC~~ SOAJ: 7.5000 mg | SUBCUTANEOUS | 1 refills | Status: DC

## 2024-01-16 NOTE — Assessment & Plan Note (Signed)
 Chronic. Continue with Plavix  and rosuvastatin .  Would benefit from increased dose of Rosuvastatin .

## 2024-01-16 NOTE — Assessment & Plan Note (Signed)
 Chronic.  Followed by psychiatry.  Symptoms have improved since stopping Albuterol .  Continue with current medication regimen.  Continue to follow up with specialist.  Recent note reviewed from psychiatry appointment.

## 2024-01-16 NOTE — Addendum Note (Signed)
 Addended by: Aileen Alexanders on: 01/16/2024 10:58 AM   Modules accepted: Orders

## 2024-01-16 NOTE — Progress Notes (Signed)
 BP 112/69   Pulse 89   Ht 4' 11 (1.499 m)   Wt 112 lb (50.8 kg)   LMP  (LMP Unknown)   BMI 22.62 kg/m    Subjective:    Patient ID: Amy Hansen, female    DOB: Nov 30, 1955, 68 y.o.   MRN: [id number]  HPI: Amy Hansen is a 68 y.o. female  Chief Complaint  Patient presents with   Medical Management of Chronic Issues    Concerned about swollen glands   DIABETES Last A1c 9.1% May.  She is currently taking her Mounjaro .  She is tolerating the medication well and okay with increasing the dose.   She is followed by Dr. General Kenner for GI, for her pancolitis.  She did miss her recent appt. (takes Entyvio )  Hypoglycemic episodes:no Polydipsia/polyuria: yes Visual disturbance: no Chest pain: no Paresthesias: no Glucose Monitoring: yes  Accucheck frequency: sometimes  Fasting glucose: 109-160  Post prandial:  Evening:  Before meals: Taking Insulin ?: no  Long acting insulin :  Short acting insulin : Blood Pressure Monitoring: not checking Retinal Examination: Up to Date -- Dr. Napolean Backbone Exam: Up to Date Pneumovax: Up to Date Influenza: Up to Date Aspirin : no   HYPERTENSION / HYPERLIPIDEMIA/HF Currently has Lasix  to take and Rosuvastatin . On Imdur .  Satisfied with current treatment? yes Duration of hypertension: chronic BP monitoring frequency: rarely BP range:  BP medication side effects: no Duration of hyperlipidemia: chronic Cholesterol medication side effects: no Cholesterol supplements: none Medication compliance: fair compliance Aspirin : no Recent stressors: yes Recurrent headaches: no Visual changes: no Palpitations: no Dyspnea: yes occasional recently Chest pain: no Lower extremity edema: no Dizzy/lightheaded: yes   DEPRESSION Patient states she was having an increase in her anxiety from the pneumonia and using the albuterol  every 6 hours.  She is feeling better now.  She does not appear as down as today as she has been in the past.   Has  Seroquel , Wellbutrin , Lamictal  to take at home.  She was previously being weaned off Xanax  but it appears she is on a stable dose at this time.  Mood status: stable Satisfied with current treatment?: yes Symptom severity: moderate  Duration of current treatment : chronic Side effects: no Medication compliance: good compliance Psychotherapy/counseling: yes in the past Depressed mood: yes Anxious mood: yes Anhedonia: yes Significant weight loss or gain: no Insomnia: yes hard to stay asleep Fatigue: yes Feelings of worthlessness or guilt: no Impaired concentration/indecisiveness: no Suicidal ideations: no Hopelessness: no Crying spells: yes    12/11/2023   10:23 AM 10/07/2023   10:27 AM 07/11/2023   10:41 AM 01/04/2023    2:54 PM 12/31/2022    2:13 PM  Depression screen PHQ 2/9  Decreased Interest 0 0 3 2 2   Down, Depressed, Hopeless 0 0 3 3 3   PHQ - 2 Score 0 0 6 5 5   Altered sleeping   3 3 3   Tired, decreased energy   3 2 2   Change in appetite   3 3 3   Feeling bad or failure about yourself    3 3 3   Trouble concentrating   3 3 3   Moving slowly or fidgety/restless   3 0 3  Suicidal thoughts   3 2 3   PHQ-9 Score   27 21 25   Difficult doing work/chores   Extremely dIfficult Extremely dIfficult Extremely dIfficult       07/11/2023   10:45 AM 12/31/2022    2:15 PM 01/18/2022    3:25  PM 08/01/2021    9:36 AM  GAD 7 : Generalized Anxiety Score  Nervous, Anxious, on Edge 3 3 3 3   Control/stop worrying 3 3 2 3   Worry too much - different things 3 3 3 3   Trouble relaxing 3 3 2 3   Restless 3 3 3 3   Easily annoyed or irritable 3 3 3 1   Afraid - awful might happen 3 3 3 1   Total GAD 7 Score 21 21 19 17   Anxiety Difficulty Extremely difficult Extremely difficult Very difficult Somewhat difficult   Relevant past medical, surgical, family and social history reviewed and updated as indicated. Interim medical history since our last visit reviewed. Allergies and medications reviewed and  updated.  Review of Systems  Eyes:  Negative for visual disturbance.  Respiratory:  Negative for cough, chest tightness and shortness of breath.   Cardiovascular:  Negative for chest pain, palpitations and leg swelling.  Gastrointestinal:  Negative for diarrhea, nausea and vomiting.  Endocrine: Positive for polydipsia and polyuria.  Neurological:  Negative for dizziness, numbness and headaches.  Psychiatric/Behavioral:  Positive for dysphoric mood. The patient is nervous/anxious.     Per HPI unless specifically indicated above     Objective:    BP 112/69   Pulse 89   Ht 4' 11 (1.499 m)   Wt 112 lb (50.8 kg)   LMP  (LMP Unknown)   BMI 22.62 kg/m   Wt Readings from Last 3 Encounters:  01/16/24 112 lb (50.8 kg)  01/09/24 112 lb 9.6 oz (51.1 kg)  12/20/23 106 lb 4 oz (48.2 kg)    Physical Exam Vitals and nursing note reviewed.  Constitutional:      General: She is awake. She is not in acute distress.    Appearance: Normal appearance. She is well-developed, well-groomed and underweight. She is not ill-appearing or toxic-appearing.  HENT:     Head: Normocephalic.     Right Ear: Hearing and external ear normal.     Left Ear: Hearing and external ear normal.   Eyes:     General: Lids are normal.        Right eye: No discharge.        Left eye: No discharge.     Conjunctiva/sclera: Conjunctivae normal.     Pupils: Pupils are equal, round, and reactive to light.   Neck:     Thyroid : No thyromegaly.     Vascular: No carotid bruit.   Cardiovascular:     Rate and Rhythm: Regular rhythm. Tachycardia present.     Heart sounds: Normal heart sounds. No murmur heard.    No gallop.     Comments: Heart rate on auscultation 123 with moving from chair to exam table.  Regular. Pulmonary:     Effort: Pulmonary effort is normal. No accessory muscle usage or respiratory distress.     Breath sounds: Normal breath sounds. No decreased breath sounds, wheezing or rhonchi.  Abdominal:      General: Bowel sounds are normal. There is no distension.     Palpations: Abdomen is soft.     Tenderness: There is generalized abdominal tenderness. There is no right CVA tenderness or left CVA tenderness.     Comments: Overall tenderness on exam to all quadrants, worse to LUQ.   Musculoskeletal:     Cervical back: Normal range of motion and neck supple.     Right lower leg: No edema.     Left lower leg: No edema.  Lymphadenopathy:  Cervical: No cervical adenopathy.   Skin:    General: Skin is warm and dry.   Neurological:     Mental Status: She is alert and oriented to person, place, and time.     Deep Tendon Reflexes: Reflexes are normal and symmetric.     Reflex Scores:      Brachioradialis reflexes are 2+ on the right side and 2+ on the left side.      Patellar reflexes are 2+ on the right side and 2+ on the left side.  Psychiatric:        Attention and Perception: Attention normal.        Mood and Affect: Mood normal.        Speech: Speech normal.        Behavior: Behavior normal. Behavior is cooperative.        Thought Content: Thought content normal.     Results for orders placed or performed in visit on 12/20/23  B12 and Folate Panel   Collection Time: 12/20/23  3:03 PM  Result Value Ref Range   Vitamin B-12 154 (L) 211 - 911 pg/mL   Folate 13.0 >5.9 ng/mL  IBC + Ferritin   Collection Time: 12/20/23  3:03 PM  Result Value Ref Range   Iron 36 (L) 42 - 145 ug/dL   Transferrin 604.5 409.8 - 360.0 mg/dL   Saturation Ratios 7.7 (L) 20.0 - 50.0 %   Ferritin 15.2 10.0 - 291.0 ng/mL   TIBC 467.6 (H) 250.0 - 450.0 mcg/dL  Comp Met (CMET)   Collection Time: 12/20/23  3:03 PM  Result Value Ref Range   Sodium 134 (L) 135 - 145 mEq/L   Potassium 3.8 3.5 - 5.1 mEq/L   Chloride 102 96 - 112 mEq/L   CO2 24 19 - 32 mEq/L   Glucose, Bld 142 (H) 70 - 99 mg/dL   BUN 7 6 - 23 mg/dL   Creatinine, Ser 1.19 0.40 - 1.20 mg/dL   Total Bilirubin 0.4 0.2 - 1.2 mg/dL    Alkaline Phosphatase 138 (H) 39 - 117 U/L   AST 17 0 - 37 U/L   ALT 14 0 - 35 U/L   Total Protein 7.0 6.0 - 8.3 g/dL   Albumin 3.8 3.5 - 5.2 g/dL   GFR 14.78 >29.56 mL/min   Calcium  8.7 8.4 - 10.5 mg/dL  CBC   Collection Time: 12/20/23  3:03 PM  Result Value Ref Range   WBC 3.7 (L) 4.0 - 10.5 K/uL   RBC 4.19 3.87 - 5.11 Mil/uL   Platelets 343.0 150.0 - 400.0 K/uL   Hemoglobin 10.8 (L) 12.0 - 15.0 g/dL   HCT 21.3 (L) 08.6 - 57.8 %   MCV 80.3 78.0 - 100.0 fl   MCHC 32.3 30.0 - 36.0 g/dL   RDW 46.9 (H) 62.9 - 52.8 %   *Note: Due to a large number of results and/or encounters for the requested time period, some results have not been displayed. A complete set of results can be found in Results Review.      Assessment & Plan:   Problem List Items Addressed This Visit       Cardiovascular and Mediastinum   Hypertension associated with type 2 diabetes mellitus (HCC) - Primary   Chronic.  Well controlled at this time.  Recommend checking blood pressures at home regularly.   Follow up in 3 months.  Call sooner if concerns arise.       Relevant Medications   tirzepatide  (  MOUNJARO ) 7.5 MG/0.5ML Pen   Coronary artery disease involving native coronary artery of native heart with unstable angina pectoris (HCC)   Chronic. Continue with Plavix  and rosuvastatin .  Would benefit from increased dose of Rosuvastatin .           Endocrine   Uncontrolled type 2 diabetes mellitus with hyperglycemia, without long-term current use of insulin  (HCC)   Chronic. Not well controlled.  Last A1c was 9.1%.  Currently on Mounjaro  5mg .  Will increase dose of Mounjaro  to 7.5mg  weekly.  Labs ordered today.  Follow up in 3 months.  Call sooner if concerns arise.       Relevant Medications   tirzepatide  (MOUNJARO ) 7.5 MG/0.5ML Pen   Diabetes mellitus treated with injections of non-insulin  medication (HCC)   Relevant Medications   tirzepatide  (MOUNJARO ) 7.5 MG/0.5ML Pen     Other   Anxiety disorder (Chronic)    Chronic.  Followed by psychiatry.  Symptoms have improved since stopping Albuterol .  Continue with current medication regimen.  Continue to follow up with specialist.  Recent note reviewed from psychiatry appointment.       Benzodiazepine dependence (HCC)   Chronic.  Followed by psychiatry.  Symptoms have improved since stopping Albuterol .  Continue with current medication regimen.  Continue to follow up with specialist.  Recent note reviewed from psychiatry appointment.         Follow up plan: Return in about 3 months (around 04/17/2024) for HTN, HLD, DM2 FU.

## 2024-01-16 NOTE — Telephone Encounter (Signed)
 error

## 2024-01-16 NOTE — Assessment & Plan Note (Signed)
 Chronic.  Well controlled at this time.  Recommend checking blood pressures at home regularly.   Follow up in 3 months.  Call sooner if concerns arise.

## 2024-01-16 NOTE — Assessment & Plan Note (Signed)
 Chronic. Not well controlled.  Last A1c was 9.1%.  Currently on Mounjaro  5mg .  Will increase dose of Mounjaro  to 7.5mg  weekly.  Labs ordered today.  Follow up in 3 months.  Call sooner if concerns arise.

## 2024-01-18 ENCOUNTER — Other Ambulatory Visit: Payer: Self-pay | Admitting: Physician Assistant

## 2024-01-23 ENCOUNTER — Other Ambulatory Visit: Payer: Self-pay | Admitting: Nurse Practitioner

## 2024-01-24 NOTE — Telephone Encounter (Signed)
 Requested Prescriptions  Pending Prescriptions Disp Refills   pramipexole  (MIRAPEX ) 1 MG tablet [Pharmacy Med Name: PRAMIPEXOLE  1 MG TABLET] 90 tablet 1    Sig: Take 1 tablet (1 mg total) by mouth at bedtime.     Neurology:  Parkinsonian Agents Passed - 01/24/2024 12:14 PM      Passed - Last BP in normal range    BP Readings from Last 1 Encounters:  01/16/24 112/69         Passed - Last Heart Rate in normal range    Pulse Readings from Last 1 Encounters:  01/16/24 89         Passed - Valid encounter within last 12 months    Recent Outpatient Visits           1 week ago Hypertension associated with type 2 diabetes mellitus (HCC)   [place] [facility] Melvin Pao, NP   1 month ago Hypertension associated with type 2 diabetes mellitus (HCC)   [place] [facility] Melvin Pao, NP   1 month ago Hospital discharge follow-up   [place] [facility] Melvin Pao, NP   1 month ago Multilobar lung infiltrate   [place] [facility] Desha, [place] T, NP   1 month ago Subacute cough   [place] [facility] [place], Melanie DASEN, NP       Future Appointments             In 1 week MacDiarmid, [place], MD [facility]

## 2024-01-28 DIAGNOSIS — K080 Exfoliation of teeth due to systemic causes: Secondary | ICD-10-CM | POA: Diagnosis not present

## 2024-01-29 ENCOUNTER — Telehealth: Payer: Self-pay | Admitting: Nurse Practitioner

## 2024-01-29 ENCOUNTER — Ambulatory Visit: Admitting: Podiatry

## 2024-01-29 NOTE — Telephone Encounter (Deleted)
 Inbound call from patient, states she can come by tomorrow for labs.

## 2024-01-29 NOTE — Telephone Encounter (Signed)
 Left message for patient to call back.  She was to go for labwork around 01/22/24 for recheck of iron studies.  This will help us  determine if she can hold oral iron, if she needs iron infusions etc.

## 2024-01-29 NOTE — Telephone Encounter (Signed)
 Patient called and stated that she is wanting to stop taking her iron medication called Ferrous Sulfate  due to that medication making her severely ill. Patient would like to know if she is able to stop taking her iron medication and get iron infusion instead. Patient would like a call back. Please advise.

## 2024-01-29 NOTE — Telephone Encounter (Signed)
 Patient advised she should come for labwork to help better determine her need for continued iron. Orders already in EPIC.

## 2024-01-30 ENCOUNTER — Other Ambulatory Visit: Payer: Self-pay | Admitting: Physician Assistant

## 2024-01-30 ENCOUNTER — Encounter: Payer: Self-pay | Admitting: Nurse Practitioner

## 2024-01-30 ENCOUNTER — Other Ambulatory Visit: Payer: Self-pay | Admitting: Podiatry

## 2024-01-30 ENCOUNTER — Telehealth: Payer: Self-pay | Admitting: Podiatry

## 2024-01-30 DIAGNOSIS — H2513 Age-related nuclear cataract, bilateral: Secondary | ICD-10-CM | POA: Diagnosis not present

## 2024-01-30 DIAGNOSIS — E119 Type 2 diabetes mellitus without complications: Secondary | ICD-10-CM | POA: Diagnosis not present

## 2024-01-30 DIAGNOSIS — H25013 Cortical age-related cataract, bilateral: Secondary | ICD-10-CM | POA: Diagnosis not present

## 2024-01-30 LAB — HM DIABETES EYE EXAM

## 2024-01-30 MED ORDER — CEPHALEXIN 500 MG PO CAPS
500.0000 mg | ORAL_CAPSULE | Freq: Three times a day (TID) | ORAL | 2 refills | Status: DC
Start: 2024-01-30 — End: 2024-04-10

## 2024-01-30 NOTE — Telephone Encounter (Signed)
 Patient states right foot big toe may be infected. She is diabetic and the toe is oozing and red. Could an antibiotic be sent to the pharmacy ?

## 2024-02-01 ENCOUNTER — Other Ambulatory Visit: Payer: Self-pay | Admitting: Physician Assistant

## 2024-02-03 ENCOUNTER — Ambulatory Visit (INDEPENDENT_AMBULATORY_CARE_PROVIDER_SITE_OTHER): Payer: Self-pay | Admitting: Urology

## 2024-02-03 ENCOUNTER — Other Ambulatory Visit (INDEPENDENT_AMBULATORY_CARE_PROVIDER_SITE_OTHER)

## 2024-02-03 ENCOUNTER — Encounter: Payer: Self-pay | Admitting: Nurse Practitioner

## 2024-02-03 VITALS — BP 131/84 | HR 74 | Ht 60.0 in | Wt 113.5 lb

## 2024-02-03 DIAGNOSIS — N390 Urinary tract infection, site not specified: Secondary | ICD-10-CM

## 2024-02-03 DIAGNOSIS — R310 Gross hematuria: Secondary | ICD-10-CM

## 2024-02-03 DIAGNOSIS — D509 Iron deficiency anemia, unspecified: Secondary | ICD-10-CM | POA: Diagnosis not present

## 2024-02-03 LAB — CBC
HCT: 34.4 % — ABNORMAL LOW (ref 36.0–46.0)
Hemoglobin: 11.1 g/dL — ABNORMAL LOW (ref 12.0–15.0)
MCHC: 32.4 g/dL (ref 30.0–36.0)
MCV: 81.8 fl (ref 78.0–100.0)
Platelets: 267 K/uL (ref 150.0–400.0)
RBC: 4.21 Mil/uL (ref 3.87–5.11)
RDW: 16.6 % — ABNORMAL HIGH (ref 11.5–15.5)
WBC: 3 K/uL — ABNORMAL LOW (ref 4.0–10.5)

## 2024-02-03 LAB — URINALYSIS, COMPLETE
Bilirubin, UA: NEGATIVE
Ketones, UA: NEGATIVE
Nitrite, UA: POSITIVE — AB
Specific Gravity, UA: 1.02 (ref 1.005–1.030)
Urobilinogen, Ur: 1 mg/dL (ref 0.2–1.0)
pH, UA: 6 (ref 5.0–7.5)

## 2024-02-03 LAB — MICROSCOPIC EXAMINATION
Epithelial Cells (non renal): >10 /HPF — AB (ref 0–10)
RBC, Urine: >30 /HPF — AB (ref 0–2)
WBC, UA: >30 /HPF — AB (ref 0–5)

## 2024-02-03 LAB — IBC + FERRITIN
Ferritin: 10.2 ng/mL (ref 10.0–291.0)
Iron: 24 ug/dL — ABNORMAL LOW (ref 42–145)
Saturation Ratios: 5.5 % — ABNORMAL LOW (ref 20.0–50.0)
TIBC: 434 ug/dL (ref 250.0–450.0)
Transferrin: 310 mg/dL (ref 212.0–360.0)

## 2024-02-03 NOTE — Progress Notes (Signed)
 02/03/2024 9:07 AM   Amy Hansen Oct 05, 1955 [id number]  Referring provider: Melvin Pao, NP [address] [place],  [place] [address]  Chief Complaint  Patient presents with   Follow-up   Recurrent UTI    HPI: Reviewed my last note It appears the patient was given Macrobid  February 2024.  On March 28 she had Ceftin  250 mg twice a day for 7 days and then back in February she also had doxycycline .  She was referred to infectious disease by someone but did not go.  Her last culture was sensitive to sulfa  Macrodantin  ciprofloxacin  and Augmentin  and ampicillin  as well as doxycycline .  She is allergic to moxifloxacin Bactrim  Cipro  with anaphylaxis to the moxifloxacin.  The culture prior to this in February demonstrated 2 organisms with different sensitivity and resistance patterns Urine sent for culture. Patient was given Augmentin  1 tablet twice a day for 7 days. Patient will then add on daily Keflex . Hopefully this helps decrease infections. We might have to go back to Macrobid  if needed. She can take Macrodantin  and Keflex . She has not been on prophylaxis for many months    Today Frequency stable.  Urine culture was + April 22.  It was sensitive to doxycycline  and resistant to Augmentin .  She had a positive culture February 05 2023 with 2 positive organisms.  Both sensitive to sulfa  Levaquin ciprofloxacin .  Patient went to the emergency room July 2024.  She had numbness to her face.  She also had left upper quadrant pain present for many weeks.  It was felt to be GI in origin.  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  Then she went to the emergency room here in [place] in July.  He is currently treated with primary care with amoxicillin  for urinary tract infection.  She says she has low  back pain improving cloudy urine and burning.  She is not taking her Estrace  cream.  I did not see another urine culture since July 9.  The amoxicillin  she is on would cover her Proteus from the last culture but not the Klebsiella.   Patient has not been on her daily Keflex  since April.    Urine reviewed and sent for culture.  Patient understands he has a difficult recurrent UTI issue.  Go back on Estrace  2-3 times a week.  See infectious disease for long-term follow-up and treatment of these difficult cultures.  Call if culture differs.  Reassess 6 months   Patient wants to go back on Estrace  and daily Keflex  recognizing limitations especially of the prophylactic antibiotic.  She is going to see her infectious disease physician in [facility] Dr. Efrain.  Will call with culture results.  She is having some vaginal itchiness and I gave her 3 days of Diflucan .  Today I last saw the patient in July 2024.  Patient has seen our extender many times since.  She was seen in April with cystitis symptoms including hematuria.  She was given doxycycline  and Diflucan  per her request.  Patient had a CT scan with contrast Dec 06, 2023 and she still had 9 mm nonobstructing stone left kidney.  Urine culture was positive in April 2025.  Urine culture was + December 2024.  She sometimes is treated with fosfomycin or doxycycline   Today was a routine appointment.  She does not think she is infected.  She says she sees blood in the urine for many months more so in the morning.  She is on daily aspirin .  She does not take a blood thinner anymore.  She quit smoking many years ago.    PMH: Past Medical History:  Diagnosis Date   Acute on chronic heart failure with preserved ejection fraction (HFpEF) (HCC) 05/16/2018   AKI (acute kidney injury) (HCC) 02/05/2023   Anemia    Anxiety    Arthritis    Blood transfusion without reported diagnosis    Cataract    CHF (congestive heart failure) (HCC)    Chronic kidney disease     UTI, hematuria in urine   Colitis    Crohn's disease (HCC)    Depression    Diabetes (HCC)    Diverticulosis    Frequent headaches    Interstitial cystitis    Recurrent UTI    Restless leg syndrome    TIA (transient ischemic attack) 02/20/2021   Urinary frequency     Surgical History: Past Surgical History:  Procedure Laterality Date   bariatric bypass  2012   BIOPSY  05/03/2020   Procedure: BIOPSY;  Surgeon: Teressa Toribio SQUIBB, MD;  Location[facility] ENDOSCOPY;  Service: Endoscopy;;   CARPAL TUNNEL RELEASE Right 2003   CARPAL TUNNEL RELEASE Right    2008   CHOLECYSTECTOMY  1975   COLONOSCOPY     COLONOSCOPY WITH PROPOFOL  N/A 05/03/2020   Procedure: COLONOSCOPY WITH PROPOFOL ;  Surgeon: Teressa Toribio SQUIBB, MD;  Location: [facility] ENDOSCOPY;  Service: Endoscopy;  Laterality: N/A;   CORONARY STENT INTERVENTION N/A 05/15/2023   Procedure: CORONARY STENT INTERVENTION;  Surgeon: Mady Bruckner, MD;  Location: [facility] INVASIVE CV LAB;  Service: Cardiovascular;  Laterality: N/A;   CYSTOSCOPY W/ RETROGRADES Bilateral 06/06/2015   Procedure: CYSTOSCOPY WITH RETROGRADE PYELOGRAM;  Surgeon: Donnice Brooks, MD;  Location: [facility] ORS;  Service: Urology;  Laterality: Bilateral;   EYE SURGERY Left 07/06/2022   FL INJ LEFT KNEE CT ARTHROGRAM ([facility] HX) Left    1995   GASTRIC BYPASS  2010   HAND SURGERY Left 01/19/2021   Thumb   HEMORRHOID SURGERY  2013   KNEE ARTHROSCOPY Left 1996   LEFT HEART CATH AND CORONARY ANGIOGRAPHY Left 05/15/2023   Procedure: LEFT HEART CATH AND CORONARY ANGIOGRAPHY;  Surgeon: Mady Bruckner, MD;  Location: [facility] INVASIVE CV LAB;  Service: Cardiovascular;  Laterality: Left;   TONSILLECTOMY     TOTAL ABDOMINAL HYSTERECTOMY W/ BILATERAL SALPINGOOPHORECTOMY      Home Medications:  Allergies as of 02/03/2024       Reactions   Avelox [moxifloxacin Hcl In Nacl] Anaphylaxis   Bactrim  [sulfamethoxazole -trimethoprim ] Anaphylaxis   Ciprofloxacin  Other (See Comments)   Pt states  she was told never to take this as it is in the same family as Avelox.    Bacitracin  Swelling   rash   Buspar  [buspirone ] Other (See Comments)   hallucinations   Neomycin -polymyxin-dexameth Other (See Comments)   Burning in the eyes   Neosporin Original [neomycin -bacitracin  Zn-polymyx] Swelling   Aquaphor [lanolin-petrolatum] Itching, Rash   Depakote [divalproex Sodium] Other (See Comments)   Unknown Reaction   Imitrex  [sumatriptan ] Other (See Comments)   Neck and shoulder pain   Stadol [butorphanol] Rash        Medication List        Accurate as of February 03, 2024  9:07 AM. If you have any questions, ask your nurse or doctor.  albuterol  (2.5 MG/3ML) 0.083% nebulizer solution Commonly known as: PROVENTIL  Take 3 mLs (2.5 mg total) by nebulization every 6 (six) hours as needed for wheezing or shortness of breath.   albuterol  108 (90 Base) MCG/ACT inhaler Commonly known as: VENTOLIN  HFA Inhale 2 puffs into the lungs every 6 (six) hours as needed for wheezing or shortness of breath.   ALPHA LIPOIC ACID PO Take by mouth.   ALPRAZolam  0.5 MG tablet Commonly known as: XANAX  Take 1 tablet (0.5 mg total) by mouth 2 (two) times daily as needed for anxiety. Must last 15 days.   aspirin  EC 81 MG tablet Take 1 tablet (81 mg) by mouth once daily. Swallow whole.   cephALEXin  500 MG capsule Commonly known as: KEFLEX  Take 1 capsule (500 mg total) by mouth 3 (three) times daily.   Compressor/Nebulizer Misc 1 each by Does not apply route as directed.   Entyvio  300 MG injection Generic drug: vedolizumab  Inject 300 mg into the vein every 30 (thirty) days.   estradiol  0.1 MG/GM vaginal cream Commonly known as: ESTRACE  Apply one pea-sized amount around the opening of the urethra 3 times weekly. What changed:  how much to take how to take this when to take this   ferrous sulfate  325 (65 FE) MG tablet Take 1 tablet (325 mg total) by mouth daily with breakfast.    furosemide  20 MG tablet Commonly known as: LASIX  Take 1 tablet (20 mg total) by mouth daily as needed (for swelling in the legs accompanied by a weight gain of 3 pounds overnight -- USE SPARINGLY).   isosorbide  mononitrate 30 MG 24 hr tablet Commonly known as: IMDUR  Take 0.5 tablets (15 mg total) by mouth daily.   MAGNESIUM  GLUCONATE PO Take by mouth.   metoprolol  tartrate 25 MG tablet Commonly known as: LOPRESSOR  Take 25 mg by mouth 2 (two) times daily.   nitroGLYCERIN  0.4 MG SL tablet Commonly known as: NITROSTAT  Place 1 tablet (0.4 mg total) under the tongue every 5 (five) minutes as needed.   nystatin  cream Commonly known as: MYCOSTATIN  Apply 1 Application topically 2 (two) times daily.   ondansetron  8 MG disintegrating tablet Commonly known as: ZOFRAN -ODT Take 1 tablet (8 mg total) by mouth every 8 (eight) hours as needed for nausea or vomiting.   pantoprazole  40 MG tablet Commonly known as: PROTONIX  Take 1 tablet (40 mg total) by mouth daily.   pramipexole  1 MG tablet Commonly known as: MIRAPEX  Take 1 tablet (1 mg total) by mouth at bedtime.   QUEtiapine  300 MG tablet Commonly known as: SEROQUEL  Take 1 tablet (300 mg total) by mouth at bedtime.   rOPINIRole  1 MG tablet Commonly known as: REQUIP  Take 1 tablet (1 mg total) by mouth 2 (two) times daily.   rosuvastatin  5 MG tablet Commonly known as: CRESTOR  TAKE 1 TABLET DAILY   sertraline  100 MG tablet Commonly known as: ZOLOFT  Take 1 tablet (100 mg total) by mouth daily.   tirzepatide  7.5 MG/0.5ML Pen Commonly known as: MOUNJARO  Inject 7.5 mg into the skin once a week.   ursodiol 300 MG capsule Commonly known as: ACTIGALL Take 300 mg by mouth 2 (two) times daily. Take 2 capsules in the AM and 1 capsule in the PM   VITAMIN B-12 PO Take by mouth.        Allergies:  Allergies  Allergen Reactions   Avelox [Moxifloxacin Hcl In Nacl] Anaphylaxis   Bactrim  [Sulfamethoxazole -Trimethoprim ]  Anaphylaxis   Ciprofloxacin  Other (See Comments)    Pt  states she was told never to take this as it is in the same family as Avelox.    Bacitracin  Swelling    rash   Buspar  [Buspirone ] Other (See Comments)    hallucinations   Neomycin -Polymyxin-Dexameth Other (See Comments)    Burning in the eyes   Neosporin Original [Neomycin -Bacitracin  Zn-Polymyx] Swelling   Aquaphor [Lanolin-Petrolatum] Itching and Rash   Depakote [Divalproex Sodium] Other (See Comments)    Unknown Reaction   Imitrex  [Sumatriptan ] Other (See Comments)    Neck and shoulder pain   Stadol [Butorphanol] Rash    Family History: Family History  Problem Relation Age of Onset   Colon cancer Mother    Stroke Father    Heart disease Father    Heart failure Sister    Diabetes Paternal Grandmother    Bladder Cancer Neg Hx    Kidney disease Neg Hx    Prostate cancer Neg Hx    Kidney cancer Neg Hx    Pancreatic cancer Neg Hx    Esophageal cancer Neg Hx    Stomach cancer Neg Hx    Rectal cancer Neg Hx    Breast cancer Neg Hx     Social History:  reports that she quit smoking about 48 years ago. Her smoking use included cigarettes. She has never used smokeless tobacco. She reports that she does not drink alcohol and does not use drugs.  ROS:                                        Physical Exam: BP 131/84   Pulse 74   Ht 5' (1.524 m)   Wt 51.5 kg   LMP  (LMP Unknown)   BMI 22.17 kg/m   Constitutional:  Alert and oriented, No acute distress. HEENT: [place] AT, moist mucus membranes.  Trachea midline, no masses.   Laboratory Data: Lab Results  Component Value Date   WBC 3.7 (L) 12/20/2023   HGB 10.8 (L) 12/20/2023   HCT 33.6 (L) 12/20/2023   MCV 80.3 12/20/2023   PLT 343.0 12/20/2023    Lab Results  Component Value Date   CREATININE 0.75 12/20/2023    No results found for: PSA  No results found for: TESTOSTERONE  Lab Results  Component Value Date   HGBA1C 9.1 (H)  12/11/2023    Urinalysis    Component Value Date/Time   COLORURINE YELLOW (A) 12/06/2023 2003   APPEARANCEUR HAZY (A) 12/06/2023 2003   APPEARANCEUR Cloudy (A) 10/31/2023 1357   LABSPEC 1.019 12/06/2023 2003   LABSPEC 1.003 01/09/2012 2126   PHURINE 6.0 12/06/2023 2003   GLUCOSEU NEGATIVE 12/06/2023 2003   GLUCOSEU Negative 01/09/2012 2126   HGBUR MODERATE (A) 12/06/2023 2003   BILIRUBINUR NEGATIVE 12/06/2023 2003   BILIRUBINUR Negative 10/31/2023 1357   BILIRUBINUR Negative 01/09/2012 2126   KETONESUR NEGATIVE 12/06/2023 2003   PROTEINUR NEGATIVE 12/06/2023 2003   NITRITE NEGATIVE 12/06/2023 2003   LEUKOCYTESUR SMALL (A) 12/06/2023 2003   LEUKOCYTESUR Negative 01/09/2012 2126    Pertinent Imaging: Urine positive sent for culture  Assessment & Plan: I would like to treat the patient culture and have her come back for cystoscopy on an antibiotic.  I do not want a false positive today so I did not perform cystoscopy today.  She has a 9 mm stone left kidney and her bladder looked normal on CT scan.  She has reported vaginal bleeding  before.  Recognizing it takes time to get the culture I will have her come back in 2 weeks for cystoscopy  The patient has had a previous sling.  She has a history recurrent UTIs interstitial cystitis and multiple positive cultures.  It looks like she was evaluated for hematuria in 2016 with retrogrades and cystoscopy and CT scan.  I looked through the medical records and am not sure if she has had a cystoscopy since then but certainly not in the last few years.  1. Recurrent UTI (Primary)  - Urinalysis, Complete   No follow-ups on file.  Glendia DELENA Elizabeth, MD  [facility] [address] [place], [place] [address] [phone]

## 2024-02-03 NOTE — Patient Instructions (Signed)

## 2024-02-06 ENCOUNTER — Ambulatory Visit (INDEPENDENT_AMBULATORY_CARE_PROVIDER_SITE_OTHER)

## 2024-02-06 VITALS — BP 120/77 | HR 80 | Temp 97.9°F | Resp 16 | Ht 60.0 in | Wt 115.0 lb

## 2024-02-06 DIAGNOSIS — K5100 Ulcerative (chronic) pancolitis without complications: Secondary | ICD-10-CM

## 2024-02-06 LAB — CULTURE, URINE COMPREHENSIVE

## 2024-02-06 MED ORDER — VEDOLIZUMAB 300 MG IV SOLR
300.0000 mg | Freq: Once | INTRAVENOUS | Status: AC
  Administered 2024-02-06: 300 mg via INTRAVENOUS
  Filled 2024-02-06: qty 5

## 2024-02-06 NOTE — Progress Notes (Signed)
 Diagnosis: Pancolitis (HCC)   Provider:  Lonna Coder MD  Procedure: IV Infusion  IV Type: Peripheral, IV Location: L Antecubital  Entyvio  (Vedolizumab ), Dose: 300 mg  Infusion Start Time: 0923  Infusion Stop Time: 1008  Post Infusion IV Care: Peripheral IV Discontinued  Discharge: Condition: Good, Destination: Home . AVS Provided  Performed by:  Maximiano JONELLE Pouch, LPN

## 2024-02-07 ENCOUNTER — Encounter: Payer: Self-pay | Admitting: Gastroenterology

## 2024-02-07 ENCOUNTER — Telehealth: Payer: Self-pay | Admitting: *Deleted

## 2024-02-07 ENCOUNTER — Other Ambulatory Visit: Payer: Self-pay | Admitting: *Deleted

## 2024-02-07 ENCOUNTER — Telehealth: Payer: Self-pay

## 2024-02-07 ENCOUNTER — Ambulatory Visit: Admitting: Gastroenterology

## 2024-02-07 VITALS — BP 94/64 | HR 84 | Ht 60.0 in | Wt 115.1 lb

## 2024-02-07 DIAGNOSIS — D509 Iron deficiency anemia, unspecified: Secondary | ICD-10-CM

## 2024-02-07 DIAGNOSIS — K523 Indeterminate colitis: Secondary | ICD-10-CM | POA: Diagnosis not present

## 2024-02-07 DIAGNOSIS — R748 Abnormal levels of other serum enzymes: Secondary | ICD-10-CM | POA: Diagnosis not present

## 2024-02-07 NOTE — Telephone Encounter (Signed)
-----   Message from Elspeth SHAUNNA Naval sent at 02/07/2024 12:16 PM EDT ----- Regarding: iv iron Can someone please help set this patient up for IV iron infusion, specific type does not matter to me, up to her insurance.  I would like to repeat CBC and TIBC ferritin roughly 1 month after done with IV iron infusions.  Thanks

## 2024-02-07 NOTE — Patient Instructions (Addendum)
 Continue Entyvio .  We will set you to get IV iron. You will be contacted for scheduling.   Please follow up in 6 months.  Thank you for entrusting me with your care and for choosing [facility], Dr. Elspeth Naval    If your blood pressure at your visit was 140/90 or greater, please contact your primary care physician to follow up on this. ______________________________________________________  If you are age 67 or older, your body mass index should be between 23-30. Your Body mass index is 22.48 kg/m. If this is out of the aforementioned range listed, please consider follow up with your Primary Care Provider.  If you are age 15 or younger, your body mass index should be between 19-25. Your Body mass index is 22.48 kg/m. If this is out of the aformentioned range listed, please consider follow up with your Primary Care Provider.  ________________________________________________________  The [facility] providers would like to encourage you to use MYCHART to communicate with providers for non-urgent requests or questions.  Due to long hold times on the telephone, sending your provider a message by [facility] may be a faster and more efficient way to get a response.  Please allow 48 business hours for a response.  Please remember that this is for non-urgent requests.  _______________________________________________________  Due to recent changes in healthcare laws, you may see the results of your imaging and laboratory studies on MyChart before your provider has had a chance to review them.  We understand that in some cases there may be results that are confusing or concerning to you. Not all laboratory results come back in the same time frame and the provider may be waiting for multiple results in order to interpret others.  Please give us  48 hours in order for your provider to thoroughly review all the results before contacting the office for clarification of your results.

## 2024-02-07 NOTE — Progress Notes (Signed)
 HPI :  68 year old female here for follow-up visit:  Colitis History: Admitted October 2021 for iron deficiency anemia and diarrhea/colitis.  She had an infectious work-up performed which was negative.  CT scan showed pancolitis at the time.  She underwent a colonoscopy with Dr. Teressa showing focal active nonspecific ileitis (on biopsies only - endoscopically ileum was normal), patchy nonspecific colitis in the right and left colon on biopsies, pancolonic inflammation grossly.  She was started on prednisone  and then transitioned eventually to Lialda  maintenance therapy. Colonoscopy with me on March 2022 for surveillance, showed a normal ileum, but with pancolitis that is active, worse in the left colon.  Biopsies showed chronic active colitis throughout.  She was hospitalized 7/13 through 02/10/2021 with acute exacerbation of diarrhea.  There was suspicion for C. difficile and she had been started on empiric vancomycin  just prior to admission as well as prednisone .  GI path panel was negative and C. difficile quick screen also negative.  She did complete a course of vancomycin . Eventually transitioned to Entyvio  once she got health care insurance. Has responded well to it, had some mild active disease on surveillance colonoscopy 2023 with mildly low Entyvio  levels, dosing increased to every 4 weeks.  SINCE THE LAST VISIT:   She is accompanied by family today.  She was last seen by Elida Nyle Sharps in May.  She was admitted to the hospital with pneumonia and respiratory failure in May.  She missed a dose or 2 of Entyvio  around that time.  She is since resumed it in June and has had 2 doses.  She states that she is doing pretty well from her bowel perspective.  She has been on oral iron which can upset her stomach and constipate her, for her iron deficiency, but other than that her bowels have been pretty stable and she states that her colitis feels that it is well-controlled.  Recall she has had  some fluctuating liver enzymes in recent years. In 2021 2022 she had mild elevation in alk phos with transaminitis which led to serologic workup. Equivocal elevation antimitochondrial antibody was repeated and negative. Unclear etiology, thought to be potential medication reaction at the time, her liver enzymes normalized however over the past 1 to 2 years again they have elevated again. Lamictal  and Seroquel  in the past have thought to been potentially related. Looks like she came off Lamictal . She had an ultrasound and then an MRCP last year which showed mild CBD dilation in the setting of postcholecystectomy but otherwise negative. She has had other imaging for other issues ongoing over the past few months, last had imaging of her liver in July and was normal. We had discussed doing a liver biopsy and she was thinking about that.   Recall she had a stent placed in her coronary artery in October and was placed on Plavix  and aspirin , so she could not get a liver biopsy.  She was seen by [facility] hepatology, Dr. March for her enzymes and positive AMA.  They started her empirically on ursodiol in February of this year.  They have been holding off on liver biopsy because the Plavix  issue.  She stopped the Plavix  in May and questions if she needs a liver biopsy.  She recently had liver enzymes drawn which showed interval improvement.  Her alk phos is 140, AST 21, ALT 12 at this point.  She is not sure if she has follow-up with them coordinated.  Of note back in May she had  a hemoglobin of 10.8 with iron deficiency.  On oral iron her hemoglobin has risen slightly to 11.1 as of 4 days ago, her iron studies remain deficient.  She has needed IV iron in the past.  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  She does take Mounjaro  for history of diabetes, she currently weighs 115 pounds but states her weight has been stable on the regimen.  Her most recent colonoscopy was 12/26/2021 which showed mild  inflammation to he transverse colon and a 4mm hyperplastic polyp was removed from the rectum. Biopsies showed inactive chronic colitis. A repeat colonoscopy in 2 years was recommended.      Prior work-up  Colonoscopy 05/03/2020: - Mild to moderate inflammation from anus to cecum with a normal appearing terminal ileum. Multiple biopsies taken.   A. SMALL BOWEL, TERMINAL ILEUM, BIOPSY:  - Focally active nonspecific ileitis  - Negative for features of chronicity or granulomas   B. COLON, RIGHT, BIOPSY:  - Patchy active nonspecific colitis with focal ulceration  - Focal microscopic aggregate of epithelioid histiocytes suggestive of a  microadenoma  - See comment   C. COLON, LEFT, BIOPSY:  - Patchy mildly active nonspecific colitis  - Negative for features of chronicity or granulomas  COMMENT:   A, B and C.   Taken together, the ileocolonic biopsies show patchy  active colitis with microgranulomas within the right colon.  Diagnostic  histologic features of idiopathic inflammatory bowel disease, including  lamina propria and basal lymphoplasmacytosis or crypt architectural  distortion are not present.  Differential diagnosis can include  infection, drug effect and evolving/early inflammatory bowel disease  (e.g. Crohn's disease).  AFB and GMS special stains are negative for  acid-fast bacilli and fungal organisms respectively.  Clinical-radiologic correlation and patient follow-up is suggested.     CTAP with contrast 04/21/2020: 1. Diffuse circumferential wall thickening of virtually all of the colon, most notably at the level of the cecum and ascending colon, consistent with infectious or inflammatory colitis. 2. There is a punctate focus of gas within the urinary bladder. Correlate for history of recent instrumentation.  Aortic Atherosclerosis     Colonoscopy 10/06/20 - The perianal and digital rectal examinations were normal. - The terminal ileum appeared normal. - Patchy  inflammation characterized by erosions, granularity, mucus and aphthous ulcerations was found in the entire colon, most prominent in the left colon. Biopsies were taken with a cold forceps for histology. - Scattered medium-mouthed diverticula were found in the entire colon. - The exam was otherwise without abnormality.   1. Surgical [P], random right sided sites - CHRONIC ACTIVE COLITIS - NO GRANULOMATA, DYSPLASIA OR MALIGNANCY IDENTIFIED 2. Surgical [P], colon, transverse - CHRONIC ACTIVE COLITIS - NO GRANULOMATA, DYSPLASIA OR MALIGNANCY IDENTIFIED 3. Surgical [P], random left sided sites - CHRONIC ACTIVE COLITIS - NO GRANULOMATA, DYSPLASIA OR MALIGNANCY IDENTIFIED    Colonoscopy 12/26/21: - The perianal and digital rectal examinations were normal. - The terminal ileum appeared normal. - Multiple small-mouthed diverticula were found in the entire colon. - Localized mild inflammation characterized by erosions and erythema was found in the transverse colon in one very focal area. Biopsies were taken with a cold forceps for histology. - A 4 mm polyp was found in the distal rectum. The polyp was sessile. The polyp was removed with a cold biopsy forceps. Resection and retrieval were complete. - The exam was otherwise without abnormality in regards to inflammatory burden. No overt inflammation seen elsewhere. There was significant residual stool. The  prep was adequate enough to assess for inflammatory changes but small or flat lesions may not have been appreciated in some portions of the colon. - Biopsies were taken with a cold forceps for histology in the right, transverse, and left colon.   1. Surgical [P], random right colon sites - COLONIC MUCOSA WITH NO SPECIFIC HISTOPATHOLOGIC CHANGES - NEGATIVE FOR ACUTE INFLAMMATION, FEATURES OF CHRONICITY, GRANULOMAS OR DYSPLASIA 2. Surgical [P], colon, transverse - INACTIVE CHRONIC COLITIS, MANIFESTED ONLY BY FOCAL MILD ARCHITECTURAL CHANGES -  NEGATIVE FOR GRANULOMAS OR DYSPLASIA 3. Surgical [P], random left colon sites - COLONIC MUCOSA WITH NO SPECIFIC HISTOPATHOLOGIC CHANGES - NEGATIVE FOR ACUTE INFLAMMATION, FEATURES OF CHRONICITY, GRANULOMAS OR DYSPLASIA 4. Surgical [P], colon, rectum, polyp (1) - HYPERPLASTIC POLYP    Entyvio  levels 01/17/22 - level of 7.7, AB undetectable - increased dosing to q 4 weeks this past summer- July   EGD 05/02/22: - The exam of the esophagus was otherwise normal. - Evidence of a Roux-en-Y gastrojejunostomy was found. The gastrojejunal anastomosis was characterized by 2 small areas of ulceration. - The gastric pouch was otherwise small but normal. - Biopsies were taken with a cold forceps for Helicobacter pylori testing. - The examined small bowel limb and blind loop were normal.   Fecal calprotectin 09/25/22 - 59   RUQ US  09/15/22 - normal   MRCP 10/30/22: IMPRESSION: Prior cholecystectomy.   Mild biliary ductal dilatation, with common bile duct measuring 12 mm. No radiographic evidence of choledocholithiasis, stricture, or other obstructing etiology.   CTAP with contrast 12/07/2023: FINDINGS: Lower chest: Moderate severity multifocal lingular, right middle lobe and bilateral lower lobe infiltrates are seen.   Hepatobiliary: There is diffuse fatty infiltration of the liver parenchyma. No focal liver abnormality is seen. Status post cholecystectomy. No biliary dilatation.   Pancreas: Unremarkable. No pancreatic ductal dilatation or surrounding inflammatory changes.   Spleen: Normal in size without focal abnormality.   Adrenals/Urinary Tract: Adrenal glands are unremarkable. Kidneys are normal in size, without obstructing renal calculi, focal lesion, or hydronephrosis. A 9 mm nonobstructing renal calculus is again seen within pole of the left kidney. A mild amount of air is seen within the lumen of a moderately distended urinary bladder.   Stomach/Bowel: There is a small hiatal  hernia. Surgical sutures are seen throughout the gastric region with surgically anastomosed bowel present within the anterior aspect of the mid left abdomen. Appendix appears normal. No evidence of bowel wall thickening, distention, or inflammatory changes. Noninflamed diverticula are seen throughout the large bowel.   Vascular/Lymphatic: Aortic atherosclerosis. No enlarged abdominal or pelvic lymph nodes.   Reproductive: Status post hysterectomy. No adnexal masses.   Other: No abdominal wall hernia or abnormality. No abdominopelvic ascites.   Musculoskeletal: Moderate to marked severity degenerative changes are seen at the level of L2-L3. A chronic deformity is seen involving the superior endplate of the T12 vertebral body.   IMPRESSION: 1. Moderate severity multifocal areas of lingular, right middle lobe and bilateral lower lobe infiltrates. 2. Hepatic steatosis. 3. Evidence of prior cholecystectomy. 4. Nonobstructing left renal calculus. 5. Colonic diverticulosis. 6. Small hiatal hernia. 7. Aortic atherosclerosis.    Past Medical History:  Diagnosis Date   Acute on chronic heart failure with preserved ejection fraction (HFpEF) (HCC) 05/16/2018   AKI (acute kidney injury) (HCC) 02/05/2023   Anemia    Anxiety    Arthritis    Blood transfusion without reported diagnosis    Cataract    CHF (congestive heart failure) (HCC)  Chronic kidney disease    UTI, hematuria in urine   Colitis    Crohn's disease (HCC)    Depression    Diabetes (HCC)    Diverticulosis    Frequent headaches    Interstitial cystitis    Recurrent UTI    Restless leg syndrome    TIA (transient ischemic attack) 02/20/2021   Urinary frequency      Past Surgical History:  Procedure Laterality Date   bariatric bypass  2012   BIOPSY  05/03/2020   Procedure: BIOPSY;  Surgeon: Teressa Toribio SQUIBB, MD;  Location: [facility] ENDOSCOPY;  Service: Endoscopy;;   CARPAL TUNNEL RELEASE Right 2003   CARPAL TUNNEL  RELEASE Right    2008   CHOLECYSTECTOMY  1975   COLONOSCOPY     COLONOSCOPY WITH PROPOFOL  N/A 05/03/2020   Procedure: COLONOSCOPY WITH PROPOFOL ;  Surgeon: Teressa Toribio SQUIBB, MD;  Location: [facility] ENDOSCOPY;  Service: Endoscopy;  Laterality: N/A;   CORONARY STENT INTERVENTION N/A 05/15/2023   Procedure: CORONARY STENT INTERVENTION;  Surgeon: Mady Bruckner, MD;  Location: [facility] INVASIVE CV LAB;  Service: Cardiovascular;  Laterality: N/A;   CYSTOSCOPY W/ RETROGRADES Bilateral 06/06/2015   Procedure: CYSTOSCOPY WITH RETROGRADE PYELOGRAM;  Surgeon: Donnice Brooks, MD;  Location: [facility] ORS;  Service: Urology;  Laterality: Bilateral;   EYE SURGERY Left 07/06/2022   FL INJ LEFT KNEE CT ARTHROGRAM ([facility] HX) Left    1995   GASTRIC BYPASS  2010   HAND SURGERY Left 01/19/2021   Thumb   HEMORRHOID SURGERY  2013   KNEE ARTHROSCOPY Left 1996   LEFT HEART CATH AND CORONARY ANGIOGRAPHY Left 05/15/2023   Procedure: LEFT HEART CATH AND CORONARY ANGIOGRAPHY;  Surgeon: Mady Bruckner, MD;  Location: [facility] INVASIVE CV LAB;  Service: Cardiovascular;  Laterality: Left;   TONSILLECTOMY     TOTAL ABDOMINAL HYSTERECTOMY W/ BILATERAL SALPINGOOPHORECTOMY     Family History  Problem Relation Age of Onset   Colon cancer Mother    Stroke Father    Heart disease Father    Heart failure Sister    Diabetes Paternal Grandmother    Bladder Cancer Neg Hx    Kidney disease Neg Hx    Prostate cancer Neg Hx    Kidney cancer Neg Hx    Pancreatic cancer Neg Hx    Esophageal cancer Neg Hx    Stomach cancer Neg Hx    Rectal cancer Neg Hx    Breast cancer Neg Hx    Social History   Tobacco Use   Smoking status: Former    Current packs/day: 0.00    Types: Cigarettes    Quit date: 04/25/1975    Years since quitting: 48.8   Smokeless tobacco: Never   Tobacco comments:    quit 48 years ago  Vaping Use   Vaping status: Never Used  Substance Use Topics   Alcohol use: No    Alcohol/week: 0.0 standard drinks of  alcohol   Drug use: No   Current Outpatient Medications  Medication Sig Dispense Refill   albuterol  (PROVENTIL ) (2.5 MG/3ML) 0.083% nebulizer solution Take 3 mLs (2.5 mg total) by nebulization every 6 (six) hours as needed for wheezing or shortness of breath. 150 mL 3   albuterol  (VENTOLIN  HFA) 108 (90 Base) MCG/ACT inhaler Inhale 2 puffs into the lungs every 6 (six) hours as needed for wheezing or shortness of breath. 18 g 5   ALPHA LIPOIC ACID PO Take by mouth.     ALPRAZolam  (XANAX ) 0.5 MG tablet Take  1 tablet (0.5 mg total) by mouth 2 (two) times daily as needed for anxiety. Must last 15 days. 30 tablet 4   aspirin  EC 81 MG tablet Take 1 tablet (81 mg) by mouth once daily. Swallow whole.     cephALEXin  (KEFLEX ) 500 MG capsule Take 1 capsule (500 mg total) by mouth 3 (three) times daily. 30 capsule 2   Cyanocobalamin  (VITAMIN B-12 PO) Take by mouth.     estradiol  (ESTRACE ) 0.1 MG/GM vaginal cream Apply one pea-sized amount around the opening of the urethra 3 times weekly. 42.5 g 12   furosemide  (LASIX ) 20 MG tablet Take 1 tablet (20 mg total) by mouth daily as needed (for swelling in the legs accompanied by a weight gain of 3 pounds overnight -- USE SPARINGLY). 45 tablet 1   isosorbide  mononitrate (IMDUR ) 30 MG 24 hr tablet Take 0.5 tablets (15 mg total) by mouth daily. 45 tablet 3   MAGNESIUM  GLUCONATE PO Take by mouth.     metoprolol  tartrate (LOPRESSOR ) 25 MG tablet Take 25 mg by mouth 2 (two) times daily.     Nebulizers (COMPRESSOR/NEBULIZER) MISC 1 each by Does not apply route as directed. 1 each 0   nitroGLYCERIN  (NITROSTAT ) 0.4 MG SL tablet Place 1 tablet (0.4 mg total) under the tongue every 5 (five) minutes as needed. 25 tablet 3   nystatin  cream (MYCOSTATIN ) Apply 1 Application topically 2 (two) times daily. 60 g 0   ondansetron  (ZOFRAN -ODT) 8 MG disintegrating tablet Take 1 tablet (8 mg total) by mouth every 8 (eight) hours as needed for nausea or vomiting. 60 tablet 1    pantoprazole  (PROTONIX ) 40 MG tablet Take 1 tablet (40 mg total) by mouth daily. 90 tablet 3   pramipexole  (MIRAPEX ) 1 MG tablet Take 1 tablet (1 mg total) by mouth at bedtime. 90 tablet 1   QUEtiapine  (SEROQUEL ) 300 MG tablet Take 1 tablet (300 mg total) by mouth at bedtime. 30 tablet 5   rOPINIRole  (REQUIP ) 1 MG tablet Take 1 tablet (1 mg total) by mouth 2 (two) times daily. 180 tablet 1   rosuvastatin  (CRESTOR ) 5 MG tablet TAKE 1 TABLET DAILY 90 tablet 1   sertraline  (ZOLOFT ) 100 MG tablet Take 1 tablet (100 mg total) by mouth daily. 30 tablet 5   tirzepatide  (MOUNJARO ) 7.5 MG/0.5ML Pen Inject 7.5 mg into the skin once a week. 6 mL 1   ursodiol (ACTIGALL) 300 MG capsule Take 300 mg by mouth 2 (two) times daily. Take 2 capsules in the AM and 1 capsule in the PM     vedolizumab  (ENTYVIO ) 300 MG injection Inject 300 mg into the vein every 30 (thirty) days.     No current facility-administered medications for this visit.   Allergies  Allergen Reactions   Avelox [Moxifloxacin Hcl In Nacl] Anaphylaxis   Bactrim  [Sulfamethoxazole -Trimethoprim ] Anaphylaxis   Ciprofloxacin  Other (See Comments)    Pt states she was told never to take this as it is in the same family as Avelox.    Bacitracin  Swelling    rash   Buspar  [Buspirone ] Other (See Comments)    hallucinations   Neomycin -Polymyxin-Dexameth Other (See Comments)    Burning in the eyes   Neosporin Original [Neomycin -Bacitracin  Zn-Polymyx] Swelling   Aquaphor [Lanolin-Petrolatum] Itching and Rash   Depakote [Divalproex Sodium] Other (See Comments)    Unknown Reaction   Imitrex  [Sumatriptan ] Other (See Comments)    Neck and shoulder pain   Stadol [Butorphanol] Rash     Review of Systems:  All systems reviewed and negative except where noted in HPI.   Labs reviewed in Epic.  Lab Results  Component Value Date   WBC 3.0 (L) 02/03/2024   HGB 11.1 (L) 02/03/2024   HCT 34.4 (L) 02/03/2024   MCV 81.8 02/03/2024   PLT 267.0 02/03/2024     Lab Results  Component Value Date   IRON 24 (L) 02/03/2024   TIBC 434.0 02/03/2024   FERRITIN 10.2 02/03/2024     Physical Exam: BP 94/64   Pulse 84   Ht 5' (1.524 m)   Wt 115 lb 2 oz (52.2 kg)   LMP  (LMP Unknown)   SpO2 95%   BMI 22.48 kg/m  Constitutional: Pleasant,well-developed, female in no acute distress. Neurological: Alert and oriented to person place and time. Psychiatric: Normal mood and affect. Behavior is normal.   ASSESSMENT: 68 y.o. female here for assessment of the following  1. Indeterminate colitis   2. Iron deficiency anemia, unspecified iron deficiency anemia type   3. Elevated liver enzymes    As above, had to hold some Entyvio  around her acute illness in the hospital in May, has since resumed it and her bowels are pretty regular and doing well.  Iron supplementation is led to constipation but her disease seems well-controlled.  We discussed timing of next colonoscopy or evaluation of her colon.  Given she had missed some Entyvio  I rather have her on maintenance therapy for a few more months before considering an exam.  Generally she is doing well and tolerating the regimen.  I will see her back in 6 months and we can discuss if she wants to do a colonoscopy or fecal calprotectin at that time.  On Entyvio  her fecal calprotectin went from nearly 6000 to 50s.  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  Will need to refer her for IV iron and check labs 1 month after infusion.  She will be contacted for scheduling that.  Recall, elevated AMA with fluctuating liver enzymes in recent years.  We could not do a liver biopsy in the past year due to Plavix  use which she has since stopped.  She was placed on ursodiol empirically by hepatology, her liver enzymes are much better.  Defer to them if they still would want to pursue a liver biopsy now that she has stopped Plavix .  I asked her to  follow-up with hepatology so they can discuss that further, I defer that decision to them in regards to if it will influence her long-term management.    Otherwise she seems stable at this point and definitely improved in regards to the rest of her health since we have last seen her.  Will continue the regimen for now, I will see her in 6 months and again we can discuss at that time whether she would want to pursue colonoscopy or use fecal calprotectin to measure her response to more frequent dosing of Entyvio  q 4 weeks.   PLAN: - continue Entyvio  - holding off on colonoscopy / fecal calprotectin since she missed some dosing, now resumed - follow up in 6 months to determine if we are going to do a colonoscopy or fecal calpro then - refer her for IV iron, as oral is not repleting her well enough - follow up with hepatology - LAEs improved on ursodiol, liver biopsy possible now off plavix , defer to hepatology if they want to pursue a biopsy -  f/u 6 months  Marcey Naval, MD [facility]

## 2024-02-07 NOTE — Telephone Encounter (Signed)
 Great, thank you!

## 2024-02-07 NOTE — Telephone Encounter (Signed)
 Dr. Leigh and Amy Hansen, patient will be scheduled as soon as possible.  Auth Submission: NO AUTH NEEDED Site of care: Site of care: [facility] Payer: [employer] medicare Medication & CPT/J Code(s) submitted: Feraheme  (ferumoxytol ) [id number] Diagnosis Code:  Route of submission (phone, fax, portal):  Phone # Fax # Auth type: Buy/Bill PB Units/visits requested: 510mg  x 2 doses Reference number:  Approval from: 02/07/24 to 06/09/24

## 2024-02-07 NOTE — Telephone Encounter (Signed)
 Orders for feraheme  placed in infusion navigator set in EPIC. CBC, IBC, ferritin orders placed in epic to be completed around 02/2024.

## 2024-02-07 NOTE — Telephone Encounter (Signed)
 PA team- Could you please help with advising which iron formulation insurance will cover? Will place infusion orders based on your recommendation. Thank you!

## 2024-02-09 ENCOUNTER — Ambulatory Visit: Payer: Self-pay | Admitting: Nurse Practitioner

## 2024-02-10 ENCOUNTER — Ambulatory Visit: Payer: Self-pay

## 2024-02-10 MED ORDER — DOXYCYCLINE HYCLATE 100 MG PO CAPS: 100.0000 mg | ORAL_CAPSULE | Freq: Two times a day (BID) | ORAL | 0 refills | Status: AC

## 2024-02-11 ENCOUNTER — Telehealth: Payer: Self-pay

## 2024-02-11 DIAGNOSIS — K080 Exfoliation of teeth due to systemic causes: Secondary | ICD-10-CM | POA: Diagnosis not present

## 2024-02-11 NOTE — Telephone Encounter (Signed)
 Incoming call from pt on triage line who states that she is having LT sided flank pain which she believes is related to her LT sided kidney stone. She states she has tried Tylenol  and Aleve  with no relief. Please advise.

## 2024-02-11 NOTE — Telephone Encounter (Signed)
 Informed pt that as soon as we get a response from Dr.MacDiarmid, we will give her a call. Informed pt that our next available appointment is next week and if she is in pain or uncomfortable, I would recommend to go to the ER. Pt voiced understanding.

## 2024-02-12 NOTE — Telephone Encounter (Signed)
 Pt presented her self at our clinic c/o of pain. Cory reiterated to the pt that she will have to go to the ER. Pt voiced understanding.

## 2024-02-13 NOTE — Telephone Encounter (Signed)
 Placed called call to advise pt to go to the ER. Per Clotilda Salles, PA, there is not a medication to help with her kidney stones. Pt has a 9mm kidney stone and is in significant pain and has been going on for days. Pt voiced understanding.

## 2024-02-13 NOTE — Telephone Encounter (Signed)
 Patient called again and states she is in significant pain and wants to know if a medication can be prescribed until she is seen on 02/24/24. Patient was advised per MacDiarmid to go to ED but patient is refused. Please advise

## 2024-02-14 ENCOUNTER — Ambulatory Visit (INDEPENDENT_AMBULATORY_CARE_PROVIDER_SITE_OTHER)

## 2024-02-14 VITALS — BP 121/83 | HR 89 | Temp 97.9°F | Resp 16 | Ht 60.0 in | Wt 113.2 lb

## 2024-02-14 DIAGNOSIS — D509 Iron deficiency anemia, unspecified: Secondary | ICD-10-CM | POA: Diagnosis not present

## 2024-02-14 MED ORDER — SODIUM CHLORIDE 0.9 % IV SOLN
510.0000 mg | Freq: Once | INTRAVENOUS | Status: AC
  Administered 2024-02-14: 510 mg via INTRAVENOUS
  Filled 2024-02-14: qty 17

## 2024-02-14 NOTE — Progress Notes (Signed)
 Diagnosis: Iron Deficiency Anemia  Provider:  Praveen Mannam MD  Procedure: IV Infusion  IV Type: Peripheral, IV Location: L Antecubital  Feraheme  (Ferumoxytol ), Dose: 510 mg  Infusion Start Time: 0930  Infusion Stop Time: 0949  Post Infusion IV Care: Observation period completed  Discharge: Condition: Good, Destination: Home . AVS Provided  Performed by:  Maximiano JONELLE Pouch, LPN

## 2024-02-19 ENCOUNTER — Encounter: Payer: Self-pay | Admitting: Ophthalmology

## 2024-02-19 NOTE — Anesthesia Preprocedure Evaluation (Signed)
 Anesthesia Evaluation    Airway        Dental   Pulmonary former smoker          Cardiovascular   04-10-23 echo 1. Left ventricular ejection fraction, by estimation, is 60 to 65%. The  left ventricle has normal function. The left ventricle has no regional  wall motion abnormalities. Left ventricular diastolic parameters are  consistent with Grade I diastolic  dysfunction (impaired relaxation).   2. Right ventricular systolic function is normal. The right ventricular  size is normal. Tricuspid regurgitation signal is inadequate for assessing  PA pressure.   3. The mitral valve is normal in structure. Mild to moderate mitral valve  regurgitation. No evidence of mitral stenosis. Moderate mitral annular  calcification.   4. Tricuspid valve regurgitation is mild to moderate.   5. The aortic valve is tricuspid. Aortic valve regurgitation is not  visualized. No aortic stenosis is present.   6. The inferior vena cava is normal in size with greater than 50%  respiratory variability, suggesting right atrial pressure of 3 mmHg.   05-15-23 heart cath Conclusions: 1. Severe single-vessel coronary artery disease with 90% stenosis of proximal OM1.  There is mild-moderate disease involving the LAD and RCA.  Sluggish flow in the distal LAD on initial images resolved with intracoronary nitroglycerin  suggesting some element of coronary vasospasm. 2. Normal left ventricular systolic function (LVEF 55-65%) with upper normal filling pressure (LVEDP 15 mmHg). 3. Successful PCI to OM1 using Onyx Frontier 2.25 x 18 mm drug-eluting stent (postdilated to 2.6 mm) with 0% residual stenosis and TIMI-3 flow. 4. Small right radial artery necessitating use of 26F sheath/catheters.  Consider alternative access for future catheterizations, particularly if larger catheters are necessary.   Recommendations: 1. Overnight observation. 2. Dual antiplatelet therapy with  aspirin  and clopidogrel  for at least 6 months. 3. Aggressive secondary prevention of coronary artery disease.  Will continue low-dose rosuvastatin  given very low LDL and concern for myopathy with higher intensity statin therapy.   Lonni Hanson, MD    Neuro/Psych    GI/Hepatic   Endo/Other  diabetes    Renal/GU      Musculoskeletal   Abdominal   Peds  Hematology   Anesthesia Other Findings   Anxiety Depression Diabetes (HCC) Frequent headaches Recurrent UTI Urinary frequency Chronic kidney disease Anemia Diverticulosis Restless leg syndrome Interstitial cystitis Crohn's disease (HCC) Colitis Arthritis Blood transfusion without reported diagnosis Cataract CHF (congestive heart failure) (HCC) TIA (transient ischemic attack) Acute on chronic heart failure with preserved ejection fraction (HFpEF) AKI (acute kidney injury) PONV (postoperative nausea and vomiting)  Primary biliary cirrhosis (HCC) Elevated liver enzymes      Reproductive/Obstetrics                              Anesthesia Physical Anesthesia Plan  ASA: 3  Anesthesia Plan: MAC   Post-op Pain Management:    Induction: Intravenous  PONV Risk Score and Plan:   Airway Management Planned: Natural Airway and Nasal Cannula  Additional Equipment:   Intra-op Plan:   Post-operative Plan:   Informed Consent: I have reviewed the patients History and Physical, chart, labs and discussed the procedure including the risks, benefits and alternatives for the proposed anesthesia with the patient or authorized representative who has indicated his/her understanding and acceptance.     Dental Advisory Given  Plan Discussed with: Anesthesiologist, CRNA and Surgeon  Anesthesia Plan Comments: (Patient consented for risks of  anesthesia including but not limited to:  - adverse reactions to medications - damage to eyes, teeth, lips or other oral mucosa - nerve damage due to positioning   - sore throat or hoarseness - Damage to heart, brain, nerves, lungs, other parts of body or loss of life  Patient voiced understanding and assent.)         Anesthesia Quick Evaluation

## 2024-02-21 ENCOUNTER — Ambulatory Visit

## 2024-02-21 VITALS — BP 120/76 | HR 74 | Temp 97.9°F | Resp 18 | Ht 60.0 in | Wt 114.0 lb

## 2024-02-21 DIAGNOSIS — D509 Iron deficiency anemia, unspecified: Secondary | ICD-10-CM | POA: Diagnosis not present

## 2024-02-21 MED ORDER — SODIUM CHLORIDE 0.9 % IV SOLN
510.0000 mg | Freq: Once | INTRAVENOUS | Status: AC
Start: 2024-02-21 — End: 2024-02-21
  Administered 2024-02-21: 510 mg via INTRAVENOUS
  Filled 2024-02-21: qty 17

## 2024-02-21 NOTE — Progress Notes (Signed)
 Diagnosis: Iron Deficiency Anemia  Provider:  Praveen Mannam MD  Procedure: IV Infusion  IV Type: Peripheral, IV Location: L Antecubital  Feraheme  (Ferumoxytol ), Dose: 510 mg  Infusion Start Time: 1327  Infusion Stop Time: 1344  Post Infusion IV Care: Patient declined observation and Peripheral IV Discontinued  Discharge: Condition: Good, Destination: Home . AVS Declined  Performed by:  Antar Milks, RN

## 2024-02-24 ENCOUNTER — Encounter: Payer: Self-pay | Admitting: Urology

## 2024-02-24 ENCOUNTER — Ambulatory Visit: Admitting: Urology

## 2024-02-24 VITALS — Ht 62.0 in

## 2024-02-24 DIAGNOSIS — N200 Calculus of kidney: Secondary | ICD-10-CM | POA: Diagnosis not present

## 2024-02-24 DIAGNOSIS — R300 Dysuria: Secondary | ICD-10-CM | POA: Diagnosis not present

## 2024-02-24 DIAGNOSIS — N390 Urinary tract infection, site not specified: Secondary | ICD-10-CM

## 2024-02-24 DIAGNOSIS — R310 Gross hematuria: Secondary | ICD-10-CM

## 2024-02-24 LAB — URINALYSIS, COMPLETE
Bilirubin, UA: NEGATIVE
Glucose, UA: NEGATIVE
Ketones, UA: NEGATIVE
Nitrite, UA: POSITIVE — AB
Protein,UA: NEGATIVE
Specific Gravity, UA: 1.01 (ref 1.005–1.030)
Urobilinogen, Ur: 0.2 mg/dL (ref 0.2–1.0)
pH, UA: 6 (ref 5.0–7.5)

## 2024-02-24 LAB — MICROSCOPIC EXAMINATION: RBC, Urine: >30 /HPF — AB (ref 0–2)

## 2024-02-24 NOTE — Patient Instructions (Signed)
Call [phone] to schedule ultrasound.

## 2024-02-24 NOTE — Progress Notes (Addendum)
 02/24/2024 8:51 AM   Amy Hansen 07-Nov-1955 [id number]  Referring provider: Melvin Pao, NP [address] [place],  [place] [address]  Chief Complaint  Patient presents with   Cysto    HPI: Reviewed my last note It appears the patient was given Macrobid  February 2024.  On March 28 she had Ceftin  250 mg twice a day for 7 days and then back in February she also had doxycycline .  She was referred to infectious disease by someone but did not go.  Her last culture was sensitive to sulfa  Macrodantin  ciprofloxacin  and Augmentin  and ampicillin  as well as doxycycline .  She is allergic to moxifloxacin Bactrim  Cipro  with anaphylaxis to the moxifloxacin.  The culture prior to this in February demonstrated 2 organisms with different sensitivity and resistance patterns Urine sent for culture. Patient was given Augmentin  1 tablet twice a day for 7 days. Patient will then add on daily Keflex . Hopefully this helps decrease infections. We might have to go back to Macrobid  if needed. She can take Macrodantin  and Keflex . She has not been on prophylaxis for many months    Today Frequency stable.  Urine culture was + April 22.  It was sensitive to doxycycline  and resistant to Augmentin .  She had a positive culture February 05 2023 with 2 positive organisms.  Both sensitive to sulfa  Levaquin ciprofloxacin .  Patient went to the emergency room July 2024.  She had numbness to her face.  She also had left upper quadrant pain present for many weeks.  It was felt to be GI in origin.  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  Then she went to the emergency room here in [place] in July.  He is currently treated with primary care with amoxicillin  for urinary tract infection.  She says she has low back pain improving  cloudy urine and burning.  She is not taking her Estrace  cream.  I did not see another urine culture since July 9.  The amoxicillin  she is on would cover her Proteus from the last culture but not the Klebsiella.   Patient has not been on her daily Keflex  since April.     Urine reviewed and sent for culture.  Patient understands he has a difficult recurrent UTI issue.  Go back on Estrace  2-3 times a week.  See infectious disease for long-term follow-up and treatment of these difficult cultures.  Call if culture differs.  Reassess 6 months   Patient wants to go back on Estrace  and daily Keflex  recognizing limitations especially of the prophylactic antibiotic.  She is going to see her infectious disease physician in [facility] Dr. Efrain.  Will call with culture results.  She is having some vaginal itchiness and I gave her 3 days of Diflucan .   Today I last saw the patient in July 2024.  Patient has seen our extender many times since.  She was seen in April with cystitis symptoms including hematuria.  She was given doxycycline  and Diflucan  per her request.  Patient had a CT scan with contrast Dec 06, 2023 and she still had 9 mm nonobstructing stone left kidney.  Urine culture was positive in April 2025.  Urine culture was + December 2024.  She sometimes is treated with fosfomycin or doxycycline    Today was a routine appointment.  She  does not think she is infected.  She says she sees blood in the urine for many months more so in the morning.  She is on daily aspirin .  She does not take a blood thinner anymore.  She quit smoking many years ago.  I would like to treat the patient culture and have her come back for cystoscopy on an antibiotic.  I do not want a false positive today so I did not perform cystoscopy today.  She has a 9 mm stone left kidney and her bladder looked normal on CT scan.  She has reported vaginal bleeding before.  Recognizing it takes time to get the culture I will have her come back in 2  weeks for cystoscopy   The patient has had a previous sling.  She has a history recurrent UTIs interstitial cystitis and multiple positive cultures.  It looks like she was evaluated for hematuria in 2016 with retrogrades and cystoscopy and CT scan.  I looked through the medical records and am not sure if she has had a cystoscopy since then but certainly not in the last few years.  Today Frequency stable.  When I saw her in July 7 she did have a positive culture sensitive to sulfa , doxycycline  and ciprofloxacin .  It was treated with doxycycline   Patient says she does not think she is infected today but gets a little bit of burning near the urethra but she thinks it may be from wiping.  She said the last 2 weeks she has had intermittent left flank pain.  She says sometimes the pain is significant.  Patient says she saw blood in the urine with wiping last week once.  No significant left CVA tenderness today.  She had a renal ultrasound in November 07, 2023 for left lower quadrant pain and she had a nonobstructing stone left kidney with no hydronephrosis  On pelvic examination she has good vaginal length and high-grade 1 cystocele.  She had modest atrophy.  I saw no source of blood and no blood on speculum  Cystoscopy: Patient underwent flexible cystoscopy.  There was burning when the scope was inserted that was significant but then things settle down.  She did states she had some left lower quadrant and low back pain during bladder filling but overall did very well.  I did have a very careful look around her bladder.  She had findings of mild cystitis cystica.  There was some white flecks in her urine.  Urothelium was within normal limits.  There was little bit of hypervascularity.  No carcinoma.  No blood.  I did not see any foreign body in the bladder  Urine positive sent for culture         PMH: Past Medical History:  Diagnosis Date   Acute on chronic heart failure with preserved ejection  fraction (HFpEF) (HCC) 05/16/2018   AKI (acute kidney injury) (HCC) 02/05/2023   Anemia    Anxiety    Arthritis    Blood transfusion without reported diagnosis    Cataract    CHF (congestive heart failure) (HCC)    Chronic kidney disease    UTI, hematuria in urine   Colitis    Crohn's disease (HCC)    Depression    Diabetes (HCC)    Diverticulosis    Elevated liver enzymes    Frequent headaches    Grade I diastolic dysfunction    Interstitial cystitis    PONV (postoperative nausea and vomiting)    Primary biliary cirrhosis (  HCC)    Recurrent UTI    Restless leg syndrome    TIA (transient ischemic attack) 02/20/2021   Urinary frequency     Surgical History: Past Surgical History:  Procedure Laterality Date   bariatric bypass  2012   BIOPSY  05/03/2020   Procedure: BIOPSY;  Surgeon: Teressa Toribio SQUIBB, MD;  Location: [facility] ENDOSCOPY;  Service: Endoscopy;;   CARPAL TUNNEL RELEASE Right 2003   CARPAL TUNNEL RELEASE Right    2008   CHOLECYSTECTOMY  1975   COLONOSCOPY     COLONOSCOPY WITH PROPOFOL  N/A 05/03/2020   Procedure: COLONOSCOPY WITH PROPOFOL ;  Surgeon: Teressa Toribio SQUIBB, MD;  Location: [facility] ENDOSCOPY;  Service: Endoscopy;  Laterality: N/A;   CORONARY STENT INTERVENTION N/A 05/15/2023   Procedure: CORONARY STENT INTERVENTION;  Surgeon: Mady Bruckner, MD;  Location: [facility] INVASIVE CV LAB;  Service: Cardiovascular;  Laterality: N/A;   CYSTOSCOPY W/ RETROGRADES Bilateral 06/06/2015   Procedure: CYSTOSCOPY WITH RETROGRADE PYELOGRAM;  Surgeon: Donnice Brooks, MD;  Location: [facility] ORS;  Service: Urology;  Laterality: Bilateral;   EYE SURGERY Left 07/06/2022   FL INJ LEFT KNEE CT ARTHROGRAM ([facility] HX) Left    1995   GASTRIC BYPASS  2010   HAND SURGERY Left 01/19/2021   Thumb   HEMORRHOID SURGERY  2013   KNEE ARTHROSCOPY Left 1996   LEFT HEART CATH AND CORONARY ANGIOGRAPHY Left 05/15/2023   Procedure: LEFT HEART CATH AND CORONARY ANGIOGRAPHY;  Surgeon: Mady Bruckner, MD;   Location: [facility] INVASIVE CV LAB;  Service: Cardiovascular;  Laterality: Left;   TONSILLECTOMY     TOTAL ABDOMINAL HYSTERECTOMY W/ BILATERAL SALPINGOOPHORECTOMY      Home Medications:  Allergies as of 02/24/2024       Reactions   Avelox [moxifloxacin Hcl In Nacl] Anaphylaxis   Bactrim  [sulfamethoxazole -trimethoprim ] Anaphylaxis   Ciprofloxacin  Other (See Comments)   Pt states she was told never to take this as it is in the same family as Avelox.    Bacitracin  Swelling   rash   Buspar  [buspirone ] Other (See Comments)   hallucinations   Neomycin -polymyxin-dexameth Other (See Comments)   Burning in the eyes   Neosporin Original [neomycin -bacitracin  Zn-polymyx] Swelling   Aquaphor [lanolin-petrolatum] Itching, Rash   Depakote [divalproex Sodium] Other (See Comments)   Unknown Reaction   Imitrex  [sumatriptan ] Other (See Comments)   Neck and shoulder pain   Stadol [butorphanol] Rash        Medication List        Accurate as of February 24, 2024  8:51 AM. If you have any questions, ask your nurse or doctor.          albuterol  (2.5 MG/3ML) 0.083% nebulizer solution Commonly known as: PROVENTIL  Take 3 mLs (2.5 mg total) by nebulization every 6 (six) hours as needed for wheezing or shortness of breath.   albuterol  108 (90 Base) MCG/ACT inhaler Commonly known as: VENTOLIN  HFA Inhale 2 puffs into the lungs every 6 (six) hours as needed for wheezing or shortness of breath.   ALPHA LIPOIC ACID PO Take by mouth.   ALPRAZolam  0.5 MG tablet Commonly known as: XANAX  Take 1 tablet (0.5 mg total) by mouth 2 (two) times daily as needed for anxiety. Must last 15 days.   aspirin  EC 81 MG tablet Take 1 tablet (81 mg) by mouth once daily. Swallow whole.   cephALEXin  500 MG capsule Commonly known as: KEFLEX  Take 1 capsule (500 mg total) by mouth 3 (three) times daily.   Compressor/Nebulizer Misc 1 each by  Does not apply route as directed.   Entyvio  300 MG injection Generic drug:  vedolizumab  Inject 300 mg into the vein every 30 (thirty) days.   estradiol  0.1 MG/GM vaginal cream Commonly known as: ESTRACE  Apply one pea-sized amount around the opening of the urethra 3 times weekly.   furosemide  20 MG tablet Commonly known as: LASIX  Take 1 tablet (20 mg total) by mouth daily as needed (for swelling in the legs accompanied by a weight gain of 3 pounds overnight -- USE SPARINGLY).   isosorbide  mononitrate 30 MG 24 hr tablet Commonly known as: IMDUR  Take 0.5 tablets (15 mg total) by mouth daily.   MAGNESIUM  GLUCONATE PO Take by mouth.   metoprolol  tartrate 25 MG tablet Commonly known as: LOPRESSOR  Take 25 mg by mouth 2 (two) times daily.   nitroGLYCERIN  0.4 MG SL tablet Commonly known as: NITROSTAT  Place 1 tablet (0.4 mg total) under the tongue every 5 (five) minutes as needed.   nystatin  cream Commonly known as: MYCOSTATIN  Apply 1 Application topically 2 (two) times daily.   ondansetron  8 MG disintegrating tablet Commonly known as: ZOFRAN -ODT Take 1 tablet (8 mg total) by mouth every 8 (eight) hours as needed for nausea or vomiting.   pantoprazole  40 MG tablet Commonly known as: PROTONIX  Take 1 tablet (40 mg total) by mouth daily.   pramipexole  1 MG tablet Commonly known as: MIRAPEX  Take 1 tablet (1 mg total) by mouth at bedtime.   QUEtiapine  300 MG tablet Commonly known as: SEROQUEL  Take 1 tablet (300 mg total) by mouth at bedtime.   rOPINIRole  1 MG tablet Commonly known as: REQUIP  Take 1 tablet (1 mg total) by mouth 2 (two) times daily.   rosuvastatin  5 MG tablet Commonly known as: CRESTOR  TAKE 1 TABLET DAILY   sertraline  100 MG tablet Commonly known as: ZOLOFT  Take 1 tablet (100 mg total) by mouth daily.   tirzepatide  7.5 MG/0.5ML Pen Commonly known as: MOUNJARO  Inject 7.5 mg into the skin once a week.   ursodiol 300 MG capsule Commonly known as: ACTIGALL Take 300 mg by mouth 2 (two) times daily. Take 2 capsules in the AM and 1  capsule in the PM   VITAMIN B-12 PO Take by mouth.        Allergies:  Allergies  Allergen Reactions   Avelox [Moxifloxacin Hcl In Nacl] Anaphylaxis   Bactrim  [Sulfamethoxazole -Trimethoprim ] Anaphylaxis   Ciprofloxacin  Other (See Comments)    Pt states she was told never to take this as it is in the same family as Avelox.    Bacitracin  Swelling    rash   Buspar  [Buspirone ] Other (See Comments)    hallucinations   Neomycin -Polymyxin-Dexameth Other (See Comments)    Burning in the eyes   Neosporin Original [Neomycin -Bacitracin  Zn-Polymyx] Swelling   Aquaphor [Lanolin-Petrolatum] Itching and Rash   Depakote [Divalproex Sodium] Other (See Comments)    Unknown Reaction   Imitrex  [Sumatriptan ] Other (See Comments)    Neck and shoulder pain   Stadol [Butorphanol] Rash    Family History: Family History  Problem Relation Age of Onset   Colon cancer Mother    Stroke Father    Heart disease Father    Heart failure Sister    Diabetes Paternal Grandmother    Bladder Cancer Neg Hx    Kidney disease Neg Hx    Prostate cancer Neg Hx    Kidney cancer Neg Hx    Pancreatic cancer Neg Hx    Esophageal cancer Neg Hx  Stomach cancer Neg Hx    Rectal cancer Neg Hx    Breast cancer Neg Hx     Social History:  reports that she quit smoking about 48 years ago. Her smoking use included cigarettes. She has never used smokeless tobacco. She reports that she does not drink alcohol and does not use drugs.  ROS:                                        Physical Exam: Ht 5' 2 (1.575 m)   LMP  (LMP Unknown)   BMI 20.85 kg/m   Constitutional:  Alert and oriented, No acute distress. HEENT: [place] AT, moist mucus membranes.  Trachea midline, no masses.   Laboratory Data: Lab Results  Component Value Date   WBC 3.0 (L) 02/03/2024   HGB 11.1 (L) 02/03/2024   HCT 34.4 (L) 02/03/2024   MCV 81.8 02/03/2024   PLT 267.0 02/03/2024    Lab Results  Component Value  Date   CREATININE 0.75 12/20/2023    No results found for: PSA  No results found for: TESTOSTERONE  Lab Results  Component Value Date   HGBA1C 9.1 (H) 12/11/2023    Urinalysis    Component Value Date/Time   COLORURINE YELLOW (A) 12/06/2023 2003   APPEARANCEUR Cloudy (A) 02/03/2024 0905   LABSPEC 1.019 12/06/2023 2003   LABSPEC 1.003 01/09/2012 2126   PHURINE 6.0 12/06/2023 2003   GLUCOSEU Trace (A) 02/03/2024 0905   GLUCOSEU Negative 01/09/2012 2126   HGBUR MODERATE (A) 12/06/2023 2003   BILIRUBINUR Negative 02/03/2024 0905   BILIRUBINUR Negative 01/09/2012 2126   KETONESUR NEGATIVE 12/06/2023 2003   PROTEINUR Trace 02/03/2024 0905   PROTEINUR NEGATIVE 12/06/2023 2003   NITRITE Positive (A) 02/03/2024 0905   NITRITE NEGATIVE 12/06/2023 2003   LEUKOCYTESUR 1+ (A) 02/03/2024 0905   LEUKOCYTESUR SMALL (A) 12/06/2023 2003   LEUKOCYTESUR Negative 01/09/2012 2126    Pertinent Imaging: Urine positive sent for culture  Assessment & Plan: I am concerned that the patient is still having blood..  In my opinion she likely still has a positive culture but like to wait for the results especially in this circumstance.  She may have an element of urethritis with her interstitial cystitis.  I think it is important that she gets a renal ultrasound soon and sees our extender to make certain she does not have an obstructing stone for her left flank pain on and off for the last 2 weeks.  I will have Sam speak to the other physicians about possible ESWL recognizing that it may not be the source of her pain or source of UTIs but still a good idea to treat based upon size.  Patient will stay on her once a day antibiotic  Patient and I both agree that it is time to have lithotripsy for a number of reasons.  It may or may not be the source of pain, recurrent hematuria, UTIs    1. Recurrent UTI (Primary)  - Urinalysis, Complete  2. Gross hematuria  - Urinalysis, Complete   No follow-ups  on file.  Amy DELENA Elizabeth, MD  [facility] [address] [place], [place] [address] [phone]

## 2024-02-27 ENCOUNTER — Ambulatory Visit
Admission: RE | Admit: 2024-02-27 | Discharge: 2024-02-27 | Disposition: A | Discharge status: Home / Self Care | Source: Ambulatory Visit | Attending: Urology | Admitting: Urology

## 2024-02-27 DIAGNOSIS — R300 Dysuria: Secondary | ICD-10-CM | POA: Diagnosis not present

## 2024-02-27 DIAGNOSIS — N390 Urinary tract infection, site not specified: Secondary | ICD-10-CM | POA: Insufficient documentation

## 2024-02-27 DIAGNOSIS — R310 Gross hematuria: Secondary | ICD-10-CM | POA: Diagnosis not present

## 2024-02-27 DIAGNOSIS — N200 Calculus of kidney: Secondary | ICD-10-CM | POA: Insufficient documentation

## 2024-02-27 DIAGNOSIS — N132 Hydronephrosis with renal and ureteral calculous obstruction: Secondary | ICD-10-CM | POA: Diagnosis not present

## 2024-02-27 NOTE — Discharge Instructions (Signed)

## 2024-02-28 ENCOUNTER — Ambulatory Visit (INDEPENDENT_AMBULATORY_CARE_PROVIDER_SITE_OTHER): Admitting: Physician Assistant

## 2024-02-28 ENCOUNTER — Other Ambulatory Visit: Payer: Self-pay

## 2024-02-28 VITALS — BP 122/75 | HR 83 | Ht 60.0 in | Wt 113.0 lb

## 2024-02-28 DIAGNOSIS — N201 Calculus of ureter: Secondary | ICD-10-CM

## 2024-02-28 LAB — CULTURE, URINE COMPREHENSIVE

## 2024-02-28 MED ORDER — OXYCODONE HCL 5 MG PO TABS: 5.0000 mg | ORAL_TABLET | Freq: Four times a day (QID) | ORAL | 0 refills | Status: DC | PRN

## 2024-02-28 MED ORDER — TAMSULOSIN HCL 0.4 MG PO CAPS: 0.4000 mg | ORAL_CAPSULE | Freq: Every day | ORAL | 0 refills | Status: DC

## 2024-02-28 MED ORDER — NITROFURANTOIN MONOHYD MACRO 100 MG PO CAPS: 100.0000 mg | ORAL_CAPSULE | Freq: Two times a day (BID) | ORAL | 0 refills | Status: AC

## 2024-02-28 NOTE — Progress Notes (Signed)
 02/28/2024 2:43 PM   Amy Hansen [birth year] [id number]  CC: Chief Complaint  Patient presents with   Follow-up   Recurrent UTI   HPI: Amy Hansen is a 68 y.o. 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   She saw Dr. MacDiarmid for cystoscopy 4 days ago and was reporting 1 to 2 weeks of left flank pain and gross hematuria.  Renal ultrasound was ordered, which shows new left hydronephrosis.  Today she reports 1 week of left flank pain, nausea, and vomiting.  She denies fevers.  She is on tirzapetide, last dose 6 days ago.  Urine culture from her office visit earlier this week grew MDR Klebsiella aerogenes.  PMH: Past Medical History:  Diagnosis Date   Acute on chronic heart failure with preserved ejection fraction (HFpEF) (HCC) 05/16/2018   AKI (acute kidney injury) (HCC) 02/05/2023   Anemia    Anxiety    Arthritis    Blood transfusion without reported diagnosis    Cataract    CHF (congestive heart failure) (HCC)    Chronic kidney disease    UTI, hematuria in urine   Colitis    Crohn's disease (HCC)    Depression    Diabetes (HCC)    Diverticulosis    Elevated liver enzymes    Frequent headaches    Grade I diastolic dysfunction    Interstitial cystitis    PONV (postoperative nausea and vomiting)    Primary biliary cirrhosis (HCC)    Recurrent UTI    Restless leg syndrome    TIA (transient ischemic attack) 02/20/2021   Urinary frequency     Surgical History: Past Surgical History:  Procedure Laterality Date   bariatric bypass  2012   BIOPSY  05/03/2020   Procedure: BIOPSY;  Surgeon: Teressa Toribio SQUIBB, MD;  Location: [facility] ENDOSCOPY;  Service: Endoscopy;;   CARPAL TUNNEL RELEASE Right 2003   CARPAL TUNNEL RELEASE Right    2008   CHOLECYSTECTOMY  1975   COLONOSCOPY     COLONOSCOPY WITH PROPOFOL  N/A  05/03/2020   Procedure: COLONOSCOPY WITH PROPOFOL ;  Surgeon: Teressa Toribio SQUIBB, MD;  Location: [facility] ENDOSCOPY;  Service: Endoscopy;  Laterality: N/A;   CORONARY STENT INTERVENTION N/A 05/15/2023   Procedure: CORONARY STENT INTERVENTION;  Surgeon: Mady Bruckner, MD;  Location: [facility] INVASIVE CV LAB;  Service: Cardiovascular;  Laterality: N/A;   CYSTOSCOPY W/ RETROGRADES Bilateral 06/06/2015   Procedure: CYSTOSCOPY WITH RETROGRADE PYELOGRAM;  Surgeon: Donnice Brooks, MD;  Location: [facility] ORS;  Service: Urology;  Laterality: Bilateral;   EYE SURGERY Left 07/06/2022   FL INJ LEFT KNEE CT ARTHROGRAM ([facility] HX) Left    1995   GASTRIC BYPASS  2010   HAND SURGERY Left 01/19/2021   Thumb   HEMORRHOID SURGERY  2013   KNEE ARTHROSCOPY Left 1996   LEFT HEART CATH AND CORONARY ANGIOGRAPHY Left 05/15/2023   Procedure: LEFT HEART CATH AND CORONARY ANGIOGRAPHY;  Surgeon: Mady Bruckner, MD;  Location: [facility] INVASIVE CV LAB;  Service: Cardiovascular;  Laterality: Left;   TONSILLECTOMY     TOTAL ABDOMINAL HYSTERECTOMY W/ BILATERAL SALPINGOOPHORECTOMY      Home Medications:  Allergies as of 02/28/2024       Reactions   Avelox [moxifloxacin Hcl In Nacl] Anaphylaxis   Bactrim  [sulfamethoxazole -trimethoprim ] Anaphylaxis   Ciprofloxacin  Other (See Comments)   Pt states she was told  never to take this as it is in the same family as Avelox.    Bacitracin  Swelling   rash   Buspar  [buspirone ] Other (See Comments)   hallucinations   Neomycin -polymyxin-dexameth Other (See Comments)   Burning in the eyes   Neosporin Original [neomycin -bacitracin  Zn-polymyx] Swelling   Aquaphor [lanolin-petrolatum] Itching, Rash   Depakote [divalproex Sodium] Other (See Comments)   Unknown Reaction   Imitrex  [sumatriptan ] Other (See Comments)   Neck and shoulder pain   Stadol [butorphanol] Rash        Medication List        Accurate as of February 28, 2024  2:43 PM. If you have any questions, ask your nurse or doctor.           albuterol  (2.5 MG/3ML) 0.083% nebulizer solution Commonly known as: PROVENTIL  Take 3 mLs (2.5 mg total) by nebulization every 6 (six) hours as needed for wheezing or shortness of breath.   albuterol  108 (90 Base) MCG/ACT inhaler Commonly known as: VENTOLIN  HFA Inhale 2 puffs into the lungs every 6 (six) hours as needed for wheezing or shortness of breath.   ALPHA LIPOIC ACID PO Take by mouth.   ALPRAZolam  0.5 MG tablet Commonly known as: XANAX  Take 1 tablet (0.5 mg total) by mouth 2 (two) times daily as needed for anxiety. Must last 15 days.   aspirin  EC 81 MG tablet Take 1 tablet (81 mg) by mouth once daily. Swallow whole.   cephALEXin  500 MG capsule Commonly known as: KEFLEX  Take 1 capsule (500 mg total) by mouth 3 (three) times daily.   Compressor/Nebulizer Misc 1 each by Does not apply route as directed.   Entyvio  300 MG injection Generic drug: vedolizumab  Inject 300 mg into the vein every 30 (thirty) days.   estradiol  0.1 MG/GM vaginal cream Commonly known as: ESTRACE  Apply one pea-sized amount around the opening of the urethra 3 times weekly.   furosemide  20 MG tablet Commonly known as: LASIX  Take 1 tablet (20 mg total) by mouth daily as needed (for swelling in the legs accompanied by a weight gain of 3 pounds overnight -- USE SPARINGLY).   isosorbide  mononitrate 30 MG 24 hr tablet Commonly known as: IMDUR  Take 0.5 tablets (15 mg total) by mouth daily.   MAGNESIUM  GLUCONATE PO Take by mouth.   metoprolol  tartrate 25 MG tablet Commonly known as: LOPRESSOR  Take 25 mg by mouth 2 (two) times daily.   nitroGLYCERIN  0.4 MG SL tablet Commonly known as: NITROSTAT  Place 1 tablet (0.4 mg total) under the tongue every 5 (five) minutes as needed.   nystatin  cream Commonly known as: MYCOSTATIN  Apply 1 Application topically 2 (two) times daily.   ondansetron  8 MG disintegrating tablet Commonly known as: ZOFRAN -ODT Take 1 tablet (8 mg total) by mouth  every 8 (eight) hours as needed for nausea or vomiting.   pantoprazole  40 MG tablet Commonly known as: PROTONIX  Take 1 tablet (40 mg total) by mouth daily.   pramipexole  1 MG tablet Commonly known as: MIRAPEX  Take 1 tablet (1 mg total) by mouth at bedtime.   QUEtiapine  300 MG tablet Commonly known as: SEROQUEL  Take 1 tablet (300 mg total) by mouth at bedtime.   rOPINIRole  1 MG tablet Commonly known as: REQUIP  Take 1 tablet (1 mg total) by mouth 2 (two) times daily.   rosuvastatin  5 MG tablet Commonly known as: CRESTOR  TAKE 1 TABLET DAILY   sertraline  100 MG tablet Commonly known as: ZOLOFT  Take 1 tablet (100 mg total) by mouth  daily.   tirzepatide  7.5 MG/0.5ML Pen Commonly known as: MOUNJARO  Inject 7.5 mg into the skin once a week.   ursodiol 300 MG capsule Commonly known as: ACTIGALL Take 300 mg by mouth 2 (two) times daily. Take 2 capsules in the AM and 1 capsule in the PM   VITAMIN B-12 PO Take by mouth.        Allergies:  Allergies  Allergen Reactions   Avelox [Moxifloxacin Hcl In Nacl] Anaphylaxis   Bactrim  [Sulfamethoxazole -Trimethoprim ] Anaphylaxis   Ciprofloxacin  Other (See Comments)    Pt states she was told never to take this as it is in the same family as Avelox.    Bacitracin  Swelling    rash   Buspar  [Buspirone ] Other (See Comments)    hallucinations   Neomycin -Polymyxin-Dexameth Other (See Comments)    Burning in the eyes   Neosporin Original [Neomycin -Bacitracin  Zn-Polymyx] Swelling   Aquaphor [Lanolin-Petrolatum] Itching and Rash   Depakote [Divalproex Sodium] Other (See Comments)    Unknown Reaction   Imitrex  [Sumatriptan ] Other (See Comments)    Neck and shoulder pain   Stadol [Butorphanol] Rash    Family History: Family History  Problem Relation Age of Onset   Colon cancer Mother    Stroke Father    Heart disease Father    Heart failure Sister    Diabetes Paternal Grandmother    Bladder Cancer Neg Hx    Kidney disease Neg Hx     Prostate cancer Neg Hx    Kidney cancer Neg Hx    Pancreatic cancer Neg Hx    Esophageal cancer Neg Hx    Stomach cancer Neg Hx    Rectal cancer Neg Hx    Breast cancer Neg Hx     Social History:   reports that she quit smoking about 48 years ago. Her smoking use included cigarettes. She has never used smokeless tobacco. She reports that she does not drink alcohol and does not use drugs.  Physical Exam: BP 122/75 (BP Location: Left Arm, Patient Position: Sitting, Cuff Size: Normal)   Pulse 83   Ht 5' (1.524 m)   Wt 113 lb (51.3 kg)   LMP  (LMP Unknown)   SpO2 99%   BMI 22.07 kg/m   Constitutional:  Alert and oriented, no acute distress, nontoxic appearing HEENT: [place], AT Cardiovascular: No clubbing, cyanosis, or edema Respiratory: Normal respiratory effort, no increased work of breathing GI: Abdomen is soft, nontender, nondistended, no abdominal masses GU: No CVA tenderness Lymph: No cervical or inguinal lymphadenopathy Skin: No rashes, bruises or suspicious lesions Neurologic: Grossly intact, no focal deficits, moving all 4 extremities Psychiatric: Normal mood and affect  Laboratory Data: Results for orders placed or performed in visit on 02/24/24  Microscopic Examination   Collection Time: 02/24/24  8:41 AM   Urine  Result Value Ref Range   WBC, UA 6-10 (A) 0 - 5 /hpf   RBC, Urine >30 (A) 0 - 2 /hpf   Epithelial Cells (non renal) 0-10 0 - 10 /hpf   Casts Present (A) None seen /lpf   Cast Type Hyaline casts N/A   Bacteria, UA Moderate (A) None seen/Few  Urinalysis, Complete   Collection Time: 02/24/24  8:41 AM  Result Value Ref Range   Specific Gravity, UA 1.010 1.005 - 1.030   pH, UA 6.0 5.0 - 7.5   Color, UA Yellow Yellow   Appearance Ur Clear Clear   Leukocytes,UA Trace (A) Negative   Protein,UA Negative Negative/Trace   Glucose,  UA Negative Negative   Ketones, UA Negative Negative   RBC, UA 3+ (A) Negative   Bilirubin, UA Negative Negative    Urobilinogen, Ur 0.2 0.2 - 1.0 mg/dL   Nitrite, UA Positive (A) Negative   Microscopic Examination See below:   CULTURE, URINE COMPREHENSIVE   Collection Time: 02/24/24  9:12 AM   Specimen: Urine   UR  Result Value Ref Range   Urine Culture, Comprehensive Final report (A)    Organism ID, Bacteria Klebsiella aerogenes (A)    Organism ID, Bacteria Not applicable    ANTIMICROBIAL SUSCEPTIBILITY Comment    *Note: Due to a large number of results and/or encounters for the requested time period, some results have not been displayed. A complete set of results can be found in Results Review.   Pertinent Imaging: RUS, 02/27/2024: See Epic  I personally reviewed the images referenced above and note new left hydronephrosis.  Assessment & Plan:   1. Left ureteral stone (Primary) Left flank pain, nausea, vomiting, and new left hydronephrosis in the setting of a known 9 mm left renal stone consistent with acute stone episode due to left ureteral stone.  Given her known Klebsiella colonization, she is at high risk for complicated UTI and I do not recommend trial of passage.  We discussed various treatment options for her stone including ESWL vs. ureteroscopy with laser lithotripsy and stent.   We specifically discussed that ESWL is a less invasive procedure that requires less anesthesia, however patients have to pass their residual stone fragments, which may take several weeks and be associated with continued renal colic. Additionally, we discussed the limitations of ESWL including the low, 10-20% chance of treatment failure requiring repeat ESWL versus ureteroscopy in the future. By comparison, ureteroscopy is a more invasive surgical procedure that requires more anesthesia, but it does require placement of a ureteral stent, which will remain in place for approximately 3-10 days and can be associated with flank pain, bladder pain, dysuria, urgency, frequency, urinary leakage, and gross  hematuria.  Based on this conversation, she would like to proceed with ureteroscopy.  I am putting in orders for surgery with Dr. Francisca in 3 days.  I am starting her on culture appropriate Macrobid  based on urine culture from earlier this week.  I advised her to hold her GLP-1 in advance of surgery.  I am prescribing Flomax and oxycodone  in the interim for symptom control and we discussed return precautions including fever, uncontrolled pain, and uncontrolled nausea/vomiting. - Ambulatory Referral For Surgery Scheduling - nitrofurantoin , macrocrystal-monohydrate, (MACROBID ) 100 MG capsule; Take 1 capsule (100 mg total) by mouth 2 (two) times daily for 5 days.  Dispense: 10 capsule; Refill: 0 - oxyCODONE  (ROXICODONE ) 5 MG immediate release tablet; Take 1-2 tablets (5-10 mg total) by mouth every 6 (six) hours as needed for severe pain (pain score 7-10).  Dispense: 20 tablet; Refill: 0 - tamsulosin (FLOMAX) 0.4 MG CAPS capsule; Take 1 capsule (0.4 mg total) by mouth daily.  Dispense: 30 capsule; Refill: 0   Return in 3 days (on 03/02/2024) for Left ureteroscopy with Dr. Francisca.  Lucie Hones, PA-C  [facility] [place] [address] [place], [place] [address] [phone]

## 2024-02-28 NOTE — Patient Instructions (Signed)
 We are planning for left ureteroscopy with laser lithotripsy and stent placement with Dr. Francisca on Monday, 03/02/2024.  In the meantime, please do the following: -Take Flomax 0.4mg  daily -Treat any pain with Advil (ibuprofen), Tylenol  (acetaminophen ), or oxycodone  -Treat any nausea with Zofran   Please go to the Emergency Department if you develop any of the following: -Fever/chills -Nausea and/or vomiting uncontrollable with Zofran  -Pain uncontrollable with oxycodone 

## 2024-02-28 NOTE — H&P (View-Only) (Signed)
 02/28/2024 2:43 PM   Amy Hansen [birth year] [id number]  CC: Chief Complaint  Patient presents with   Follow-up   Recurrent UTI   HPI: Amy Hansen is a 68 y.o. 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   She saw Dr. MacDiarmid for cystoscopy 4 days ago and was reporting 1 to 2 weeks of left flank pain and gross hematuria.  Renal ultrasound was ordered, which shows new left hydronephrosis.  Today she reports 1 week of left flank pain, nausea, and vomiting.  She denies fevers.  She is on tirzapetide, last dose 6 days ago.  Urine culture from her office visit earlier this week grew MDR Klebsiella aerogenes.  PMH: Past Medical History:  Diagnosis Date   Acute on chronic heart failure with preserved ejection fraction (HFpEF) (HCC) 05/16/2018   AKI (acute kidney injury) (HCC) 02/05/2023   Anemia    Anxiety    Arthritis    Blood transfusion without reported diagnosis    Cataract    CHF (congestive heart failure) (HCC)    Chronic kidney disease    UTI, hematuria in urine   Colitis    Crohn's disease (HCC)    Depression    Diabetes (HCC)    Diverticulosis    Elevated liver enzymes    Frequent headaches    Grade I diastolic dysfunction    Interstitial cystitis    PONV (postoperative nausea and vomiting)    Primary biliary cirrhosis (HCC)    Recurrent UTI    Restless leg syndrome    TIA (transient ischemic attack) 02/20/2021   Urinary frequency     Surgical History: Past Surgical History:  Procedure Laterality Date   bariatric bypass  2012   BIOPSY  05/03/2020   Procedure: BIOPSY;  Surgeon: Teressa Toribio SQUIBB, MD;  Location: [facility] ENDOSCOPY;  Service: Endoscopy;;   CARPAL TUNNEL RELEASE Right 2003   CARPAL TUNNEL RELEASE Right    2008   CHOLECYSTECTOMY  1975   COLONOSCOPY     COLONOSCOPY WITH PROPOFOL  N/A  05/03/2020   Procedure: COLONOSCOPY WITH PROPOFOL ;  Surgeon: Teressa Toribio SQUIBB, MD;  Location: [facility] ENDOSCOPY;  Service: Endoscopy;  Laterality: N/A;   CORONARY STENT INTERVENTION N/A 05/15/2023   Procedure: CORONARY STENT INTERVENTION;  Surgeon: Mady Bruckner, MD;  Location: [facility] INVASIVE CV LAB;  Service: Cardiovascular;  Laterality: N/A;   CYSTOSCOPY W/ RETROGRADES Bilateral 06/06/2015   Procedure: CYSTOSCOPY WITH RETROGRADE PYELOGRAM;  Surgeon: Donnice Brooks, MD;  Location: [facility] ORS;  Service: Urology;  Laterality: Bilateral;   EYE SURGERY Left 07/06/2022   FL INJ LEFT KNEE CT ARTHROGRAM ([facility] HX) Left    1995   GASTRIC BYPASS  2010   HAND SURGERY Left 01/19/2021   Thumb   HEMORRHOID SURGERY  2013   KNEE ARTHROSCOPY Left 1996   LEFT HEART CATH AND CORONARY ANGIOGRAPHY Left 05/15/2023   Procedure: LEFT HEART CATH AND CORONARY ANGIOGRAPHY;  Surgeon: Mady Bruckner, MD;  Location: [facility] INVASIVE CV LAB;  Service: Cardiovascular;  Laterality: Left;   TONSILLECTOMY     TOTAL ABDOMINAL HYSTERECTOMY W/ BILATERAL SALPINGOOPHORECTOMY      Home Medications:  Allergies as of 02/28/2024       Reactions   Avelox [moxifloxacin Hcl In Nacl] Anaphylaxis   Bactrim  [sulfamethoxazole -trimethoprim ] Anaphylaxis   Ciprofloxacin  Other (See Comments)   Pt states she was told  never to take this as it is in the same family as Avelox.    Bacitracin  Swelling   rash   Buspar  [buspirone ] Other (See Comments)   hallucinations   Neomycin -polymyxin-dexameth Other (See Comments)   Burning in the eyes   Neosporin Original [neomycin -bacitracin  Zn-polymyx] Swelling   Aquaphor [lanolin-petrolatum] Itching, Rash   Depakote [divalproex Sodium] Other (See Comments)   Unknown Reaction   Imitrex  [sumatriptan ] Other (See Comments)   Neck and shoulder pain   Stadol [butorphanol] Rash        Medication List        Accurate as of February 28, 2024  2:43 PM. If you have any questions, ask your nurse or doctor.           albuterol  (2.5 MG/3ML) 0.083% nebulizer solution Commonly known as: PROVENTIL  Take 3 mLs (2.5 mg total) by nebulization every 6 (six) hours as needed for wheezing or shortness of breath.   albuterol  108 (90 Base) MCG/ACT inhaler Commonly known as: VENTOLIN  HFA Inhale 2 puffs into the lungs every 6 (six) hours as needed for wheezing or shortness of breath.   ALPHA LIPOIC ACID PO Take by mouth.   ALPRAZolam  0.5 MG tablet Commonly known as: XANAX  Take 1 tablet (0.5 mg total) by mouth 2 (two) times daily as needed for anxiety. Must last 15 days.   aspirin  EC 81 MG tablet Take 1 tablet (81 mg) by mouth once daily. Swallow whole.   cephALEXin  500 MG capsule Commonly known as: KEFLEX  Take 1 capsule (500 mg total) by mouth 3 (three) times daily.   Compressor/Nebulizer Misc 1 each by Does not apply route as directed.   Entyvio  300 MG injection Generic drug: vedolizumab  Inject 300 mg into the vein every 30 (thirty) days.   estradiol  0.1 MG/GM vaginal cream Commonly known as: ESTRACE  Apply one pea-sized amount around the opening of the urethra 3 times weekly.   furosemide  20 MG tablet Commonly known as: LASIX  Take 1 tablet (20 mg total) by mouth daily as needed (for swelling in the legs accompanied by a weight gain of 3 pounds overnight -- USE SPARINGLY).   isosorbide  mononitrate 30 MG 24 hr tablet Commonly known as: IMDUR  Take 0.5 tablets (15 mg total) by mouth daily.   MAGNESIUM  GLUCONATE PO Take by mouth.   metoprolol  tartrate 25 MG tablet Commonly known as: LOPRESSOR  Take 25 mg by mouth 2 (two) times daily.   nitroGLYCERIN  0.4 MG SL tablet Commonly known as: NITROSTAT  Place 1 tablet (0.4 mg total) under the tongue every 5 (five) minutes as needed.   nystatin  cream Commonly known as: MYCOSTATIN  Apply 1 Application topically 2 (two) times daily.   ondansetron  8 MG disintegrating tablet Commonly known as: ZOFRAN -ODT Take 1 tablet (8 mg total) by mouth  every 8 (eight) hours as needed for nausea or vomiting.   pantoprazole  40 MG tablet Commonly known as: PROTONIX  Take 1 tablet (40 mg total) by mouth daily.   pramipexole  1 MG tablet Commonly known as: MIRAPEX  Take 1 tablet (1 mg total) by mouth at bedtime.   QUEtiapine  300 MG tablet Commonly known as: SEROQUEL  Take 1 tablet (300 mg total) by mouth at bedtime.   rOPINIRole  1 MG tablet Commonly known as: REQUIP  Take 1 tablet (1 mg total) by mouth 2 (two) times daily.   rosuvastatin  5 MG tablet Commonly known as: CRESTOR  TAKE 1 TABLET DAILY   sertraline  100 MG tablet Commonly known as: ZOLOFT  Take 1 tablet (100 mg total) by mouth  daily.   tirzepatide  7.5 MG/0.5ML Pen Commonly known as: MOUNJARO  Inject 7.5 mg into the skin once a week.   ursodiol 300 MG capsule Commonly known as: ACTIGALL Take 300 mg by mouth 2 (two) times daily. Take 2 capsules in the AM and 1 capsule in the PM   VITAMIN B-12 PO Take by mouth.        Allergies:  Allergies  Allergen Reactions   Avelox [Moxifloxacin Hcl In Nacl] Anaphylaxis   Bactrim  [Sulfamethoxazole -Trimethoprim ] Anaphylaxis   Ciprofloxacin  Other (See Comments)    Pt states she was told never to take this as it is in the same family as Avelox.    Bacitracin  Swelling    rash   Buspar  [Buspirone ] Other (See Comments)    hallucinations   Neomycin -Polymyxin-Dexameth Other (See Comments)    Burning in the eyes   Neosporin Original [Neomycin -Bacitracin  Zn-Polymyx] Swelling   Aquaphor [Lanolin-Petrolatum] Itching and Rash   Depakote [Divalproex Sodium] Other (See Comments)    Unknown Reaction   Imitrex  [Sumatriptan ] Other (See Comments)    Neck and shoulder pain   Stadol [Butorphanol] Rash    Family History: Family History  Problem Relation Age of Onset   Colon cancer Mother    Stroke Father    Heart disease Father    Heart failure Sister    Diabetes Paternal Grandmother    Bladder Cancer Neg Hx    Kidney disease Neg Hx     Prostate cancer Neg Hx    Kidney cancer Neg Hx    Pancreatic cancer Neg Hx    Esophageal cancer Neg Hx    Stomach cancer Neg Hx    Rectal cancer Neg Hx    Breast cancer Neg Hx     Social History:   reports that she quit smoking about 48 years ago. Her smoking use included cigarettes. She has never used smokeless tobacco. She reports that she does not drink alcohol and does not use drugs.  Physical Exam: BP 122/75 (BP Location: Left Arm, Patient Position: Sitting, Cuff Size: Normal)   Pulse 83   Ht 5' (1.524 m)   Wt 113 lb (51.3 kg)   LMP  (LMP Unknown)   SpO2 99%   BMI 22.07 kg/m   Constitutional:  Alert and oriented, no acute distress, nontoxic appearing HEENT: [place], AT Cardiovascular: No clubbing, cyanosis, or edema Respiratory: Normal respiratory effort, no increased work of breathing GI: Abdomen is soft, nontender, nondistended, no abdominal masses GU: No CVA tenderness Lymph: No cervical or inguinal lymphadenopathy Skin: No rashes, bruises or suspicious lesions Neurologic: Grossly intact, no focal deficits, moving all 4 extremities Psychiatric: Normal mood and affect  Laboratory Data: Results for orders placed or performed in visit on 02/24/24  Microscopic Examination   Collection Time: 02/24/24  8:41 AM   Urine  Result Value Ref Range   WBC, UA 6-10 (A) 0 - 5 /hpf   RBC, Urine >30 (A) 0 - 2 /hpf   Epithelial Cells (non renal) 0-10 0 - 10 /hpf   Casts Present (A) None seen /lpf   Cast Type Hyaline casts N/A   Bacteria, UA Moderate (A) None seen/Few  Urinalysis, Complete   Collection Time: 02/24/24  8:41 AM  Result Value Ref Range   Specific Gravity, UA 1.010 1.005 - 1.030   pH, UA 6.0 5.0 - 7.5   Color, UA Yellow Yellow   Appearance Ur Clear Clear   Leukocytes,UA Trace (A) Negative   Protein,UA Negative Negative/Trace   Glucose,  UA Negative Negative   Ketones, UA Negative Negative   RBC, UA 3+ (A) Negative   Bilirubin, UA Negative Negative    Urobilinogen, Ur 0.2 0.2 - 1.0 mg/dL   Nitrite, UA Positive (A) Negative   Microscopic Examination See below:   CULTURE, URINE COMPREHENSIVE   Collection Time: 02/24/24  9:12 AM   Specimen: Urine   UR  Result Value Ref Range   Urine Culture, Comprehensive Final report (A)    Organism ID, Bacteria Klebsiella aerogenes (A)    Organism ID, Bacteria Not applicable    ANTIMICROBIAL SUSCEPTIBILITY Comment    *Note: Due to a large number of results and/or encounters for the requested time period, some results have not been displayed. A complete set of results can be found in Results Review.   Pertinent Imaging: RUS, 02/27/2024: See Epic  I personally reviewed the images referenced above and note new left hydronephrosis.  Assessment & Plan:   1. Left ureteral stone (Primary) Left flank pain, nausea, vomiting, and new left hydronephrosis in the setting of a known 9 mm left renal stone consistent with acute stone episode due to left ureteral stone.  Given her known Klebsiella colonization, she is at high risk for complicated UTI and I do not recommend trial of passage.  We discussed various treatment options for her stone including ESWL vs. ureteroscopy with laser lithotripsy and stent.   We specifically discussed that ESWL is a less invasive procedure that requires less anesthesia, however patients have to pass their residual stone fragments, which may take several weeks and be associated with continued renal colic. Additionally, we discussed the limitations of ESWL including the low, 10-20% chance of treatment failure requiring repeat ESWL versus ureteroscopy in the future. By comparison, ureteroscopy is a more invasive surgical procedure that requires more anesthesia, but it does require placement of a ureteral stent, which will remain in place for approximately 3-10 days and can be associated with flank pain, bladder pain, dysuria, urgency, frequency, urinary leakage, and gross  hematuria.  Based on this conversation, she would like to proceed with ureteroscopy.  I am putting in orders for surgery with Dr. Francisca in 3 days.  I am starting her on culture appropriate Macrobid  based on urine culture from earlier this week.  I advised her to hold her GLP-1 in advance of surgery.  I am prescribing Flomax and oxycodone  in the interim for symptom control and we discussed return precautions including fever, uncontrolled pain, and uncontrolled nausea/vomiting. - Ambulatory Referral For Surgery Scheduling - nitrofurantoin , macrocrystal-monohydrate, (MACROBID ) 100 MG capsule; Take 1 capsule (100 mg total) by mouth 2 (two) times daily for 5 days.  Dispense: 10 capsule; Refill: 0 - oxyCODONE  (ROXICODONE ) 5 MG immediate release tablet; Take 1-2 tablets (5-10 mg total) by mouth every 6 (six) hours as needed for severe pain (pain score 7-10).  Dispense: 20 tablet; Refill: 0 - tamsulosin (FLOMAX) 0.4 MG CAPS capsule; Take 1 capsule (0.4 mg total) by mouth daily.  Dispense: 30 capsule; Refill: 0   Return in 3 days (on 03/02/2024) for Left ureteroscopy with Dr. Francisca.  Lucie Hones, PA-C  [facility] [place] [address] [place], [place] [address] [phone]

## 2024-03-01 MED ORDER — GENTAMICIN SULFATE 40 MG/ML IJ SOLN
5.0000 mg/kg | INTRAVENOUS | Status: AC
  Administered 2024-03-02: 256.4 mg via INTRAVENOUS
  Filled 2024-03-01: qty 6.5

## 2024-03-01 MED ORDER — SODIUM CHLORIDE 0.9 % IV SOLN: INTRAVENOUS | Status: DC

## 2024-03-01 MED ORDER — CHLORHEXIDINE GLUCONATE 0.12 % MT SOLN
15.0000 mL | Freq: Once | OROMUCOSAL | Status: AC
  Administered 2024-03-02: 15 mL via OROMUCOSAL

## 2024-03-01 MED ORDER — ARMC OPHTHALMIC DILATING DROPS: 1.0000 | OPHTHALMIC | Status: DC | PRN

## 2024-03-01 MED ORDER — ORAL CARE MOUTH RINSE: 15.0000 mL | Freq: Once | OROMUCOSAL | Status: AC

## 2024-03-01 MED ORDER — TETRACAINE HCL 0.5 % OP SOLN: 1.0000 [drp] | OPHTHALMIC | Status: DC | PRN

## 2024-03-02 ENCOUNTER — Ambulatory Visit

## 2024-03-02 ENCOUNTER — Ambulatory Visit
Admission: RE | Admit: 2024-03-02 | Discharge: 2024-03-02 | Disposition: A | Discharge status: Home / Self Care | Attending: Urology | Admitting: Urology

## 2024-03-02 ENCOUNTER — Encounter: Payer: Self-pay | Admitting: Urology

## 2024-03-02 ENCOUNTER — Ambulatory Visit: Payer: Self-pay

## 2024-03-02 ENCOUNTER — Other Ambulatory Visit: Payer: Self-pay

## 2024-03-02 ENCOUNTER — Encounter
Admission: RE | Disposition: A | Discharge status: Home / Self Care | Payer: Self-pay | Source: Home / Self Care | Attending: Urology

## 2024-03-02 DIAGNOSIS — K219 Gastro-esophageal reflux disease without esophagitis: Secondary | ICD-10-CM | POA: Diagnosis not present

## 2024-03-02 DIAGNOSIS — N189 Chronic kidney disease, unspecified: Secondary | ICD-10-CM | POA: Insufficient documentation

## 2024-03-02 DIAGNOSIS — N201 Calculus of ureter: Secondary | ICD-10-CM | POA: Insufficient documentation

## 2024-03-02 DIAGNOSIS — N390 Urinary tract infection, site not specified: Secondary | ICD-10-CM

## 2024-03-02 DIAGNOSIS — E1122 Type 2 diabetes mellitus with diabetic chronic kidney disease: Secondary | ICD-10-CM | POA: Insufficient documentation

## 2024-03-02 DIAGNOSIS — Z7985 Long-term (current) use of injectable non-insulin antidiabetic drugs: Secondary | ICD-10-CM | POA: Diagnosis not present

## 2024-03-02 DIAGNOSIS — I130 Hypertensive heart and chronic kidney disease with heart failure and stage 1 through stage 4 chronic kidney disease, or unspecified chronic kidney disease: Secondary | ICD-10-CM | POA: Diagnosis not present

## 2024-03-02 DIAGNOSIS — F32A Depression, unspecified: Secondary | ICD-10-CM | POA: Insufficient documentation

## 2024-03-02 DIAGNOSIS — I471 Supraventricular tachycardia, unspecified: Secondary | ICD-10-CM | POA: Diagnosis not present

## 2024-03-02 DIAGNOSIS — Z79899 Other long term (current) drug therapy: Secondary | ICD-10-CM | POA: Diagnosis not present

## 2024-03-02 DIAGNOSIS — Z87891 Personal history of nicotine dependence: Secondary | ICD-10-CM | POA: Diagnosis not present

## 2024-03-02 DIAGNOSIS — F419 Anxiety disorder, unspecified: Secondary | ICD-10-CM | POA: Insufficient documentation

## 2024-03-02 DIAGNOSIS — N200 Calculus of kidney: Secondary | ICD-10-CM | POA: Diagnosis not present

## 2024-03-02 DIAGNOSIS — Z7984 Long term (current) use of oral hypoglycemic drugs: Secondary | ICD-10-CM | POA: Diagnosis not present

## 2024-03-02 DIAGNOSIS — I5032 Chronic diastolic (congestive) heart failure: Secondary | ICD-10-CM | POA: Insufficient documentation

## 2024-03-02 HISTORY — DX: Abnormal levels of other serum enzymes: R74.8

## 2024-03-02 HISTORY — DX: Other ill-defined heart diseases: I51.89

## 2024-03-02 HISTORY — PX: CYSTOSCOPY/URETEROSCOPY/HOLMIUM LASER/STENT PLACEMENT: SHX6546

## 2024-03-02 LAB — GLUCOSE, CAPILLARY
Glucose-Capillary: 156 mg/dL — ABNORMAL HIGH (ref 70–99)
Glucose-Capillary: 93 mg/dL (ref 70–99)

## 2024-03-02 SURGERY — CYSTOSCOPY/URETEROSCOPY/HOLMIUM LASER/STENT PLACEMENT
Anesthesia: General | Site: Ureter | Laterality: Left

## 2024-03-02 MED ORDER — LACTATED RINGERS IV SOLN: INTRAVENOUS | Status: DC | PRN

## 2024-03-02 MED ORDER — ROCURONIUM BROMIDE 100 MG/10ML IV SOLN
INTRAVENOUS | Status: DC | PRN
Start: 2024-03-02 — End: 2024-03-02
  Administered 2024-03-02: 50 mg via INTRAVENOUS

## 2024-03-02 MED ORDER — SUGAMMADEX SODIUM 200 MG/2ML IV SOLN
INTRAVENOUS | Status: DC | PRN
Start: 2024-03-02 — End: 2024-03-02
  Administered 2024-03-02: 200 mg via INTRAVENOUS

## 2024-03-02 MED ORDER — KETOROLAC TROMETHAMINE 30 MG/ML IJ SOLN
INTRAMUSCULAR | Status: AC
  Filled 2024-03-02: qty 1

## 2024-03-02 MED ORDER — FENTANYL CITRATE (PF) 100 MCG/2ML IJ SOLN
INTRAMUSCULAR | Status: AC
  Filled 2024-03-02: qty 2

## 2024-03-02 MED ORDER — DROPERIDOL 2.5 MG/ML IJ SOLN: 0.6250 mg | Freq: Once | INTRAMUSCULAR | Status: DC | PRN

## 2024-03-02 MED ORDER — CHLORHEXIDINE GLUCONATE 0.12 % MT SOLN
OROMUCOSAL | Status: AC
  Filled 2024-03-02: qty 15

## 2024-03-02 MED ORDER — FENTANYL CITRATE (PF) 100 MCG/2ML IJ SOLN
INTRAMUSCULAR | Status: AC
Start: 2024-03-02 — End: 2024-03-02
  Filled 2024-03-02: qty 2

## 2024-03-02 MED ORDER — FENTANYL CITRATE (PF) 100 MCG/2ML IJ SOLN
25.0000 ug | INTRAMUSCULAR | Status: DC | PRN
  Administered 2024-03-02 (×2): 50 ug via INTRAVENOUS

## 2024-03-02 MED ORDER — OXYCODONE HCL 5 MG PO TABS
ORAL_TABLET | ORAL | Status: AC
  Filled 2024-03-02: qty 1

## 2024-03-02 MED ORDER — ONDANSETRON HCL 4 MG/2ML IJ SOLN
INTRAMUSCULAR | Status: DC | PRN
  Administered 2024-03-02: 4 mg via INTRAVENOUS

## 2024-03-02 MED ORDER — PROPOFOL 1000 MG/100ML IV EMUL
INTRAVENOUS | Status: AC
  Filled 2024-03-02: qty 100

## 2024-03-02 MED ORDER — PHENYLEPHRINE 80 MCG/ML (10ML) SYRINGE FOR IV PUSH (FOR BLOOD PRESSURE SUPPORT)
PREFILLED_SYRINGE | INTRAVENOUS | Status: AC
  Filled 2024-03-02: qty 30

## 2024-03-02 MED ORDER — SODIUM CHLORIDE 0.9 % IR SOLN
Status: DC | PRN
  Administered 2024-03-02: 3000 mL

## 2024-03-02 MED ORDER — DEXAMETHASONE SODIUM PHOSPHATE 10 MG/ML IJ SOLN
INTRAMUSCULAR | Status: DC | PRN
Start: 2024-03-02 — End: 2024-03-02
  Administered 2024-03-02: 10 mg via INTRAVENOUS

## 2024-03-02 MED ORDER — FENTANYL CITRATE (PF) 100 MCG/2ML IJ SOLN
INTRAMUSCULAR | Status: DC | PRN
  Administered 2024-03-02: 50 ug via INTRAVENOUS

## 2024-03-02 MED ORDER — KETOROLAC TROMETHAMINE 15 MG/ML IJ SOLN
INTRAMUSCULAR | Status: DC | PRN
  Administered 2024-03-02: 15 mg via INTRAVENOUS

## 2024-03-02 MED ORDER — ONDANSETRON HCL 4 MG/2ML IJ SOLN
INTRAMUSCULAR | Status: AC
  Filled 2024-03-02: qty 2

## 2024-03-02 MED ORDER — IOHEXOL 180 MG/ML  SOLN
INTRAMUSCULAR | Status: DC | PRN
  Administered 2024-03-02: 10 mL

## 2024-03-02 MED ORDER — PROPOFOL 10 MG/ML IV BOLUS
INTRAVENOUS | Status: AC
  Filled 2024-03-02: qty 20

## 2024-03-02 MED ORDER — MIDAZOLAM HCL 2 MG/2ML IJ SOLN
INTRAMUSCULAR | Status: AC
  Filled 2024-03-02: qty 2

## 2024-03-02 MED ORDER — PROPOFOL 10 MG/ML IV BOLUS
INTRAVENOUS | Status: DC | PRN
  Administered 2024-03-02: 100 mg via INTRAVENOUS

## 2024-03-02 MED ORDER — OXYCODONE HCL 5 MG PO TABS: 5.0000 mg | ORAL_TABLET | Freq: Three times a day (TID) | ORAL | 0 refills | Status: AC | PRN

## 2024-03-02 MED ORDER — MIDAZOLAM HCL 2 MG/2ML IJ SOLN
INTRAMUSCULAR | Status: DC | PRN
  Administered 2024-03-02: 2 mg via INTRAVENOUS

## 2024-03-02 MED ORDER — DEXAMETHASONE SODIUM PHOSPHATE 10 MG/ML IJ SOLN
INTRAMUSCULAR | Status: AC
  Filled 2024-03-02: qty 1

## 2024-03-02 MED ORDER — PHENYLEPHRINE 80 MCG/ML (10ML) SYRINGE FOR IV PUSH (FOR BLOOD PRESSURE SUPPORT)
PREFILLED_SYRINGE | INTRAVENOUS | Status: DC | PRN
Start: 2024-03-02 — End: 2024-03-02
  Administered 2024-03-02 (×2): 80 ug via INTRAVENOUS

## 2024-03-02 MED ORDER — ROCURONIUM BROMIDE 10 MG/ML (PF) SYRINGE
PREFILLED_SYRINGE | INTRAVENOUS | Status: AC
Start: 2024-03-02 — End: 2024-03-02
  Filled 2024-03-02: qty 10

## 2024-03-02 MED ORDER — OXYCODONE HCL 5 MG PO TABS
5.0000 mg | ORAL_TABLET | Freq: Once | ORAL | Status: AC
  Administered 2024-03-02: 5 mg via ORAL

## 2024-03-02 MED ORDER — LIDOCAINE HCL (CARDIAC) PF 100 MG/5ML IV SOSY
PREFILLED_SYRINGE | INTRAVENOUS | Status: DC | PRN
  Administered 2024-03-02: 100 mg via INTRAVENOUS

## 2024-03-02 SURGICAL SUPPLY — 27 items
ADHESIVE MASTISOL STRL (MISCELLANEOUS) IMPLANT
BAG DRAIN SIEMENS DORNER NS (MISCELLANEOUS) ×1 IMPLANT
BAG PRESSURE INF REUSE 3000 (BAG) ×1 IMPLANT
BRUSH SCRUB EZ 4% CHG (MISCELLANEOUS) ×1 IMPLANT
CATH URET FLEX-TIP 2 LUMEN 10F (CATHETERS) IMPLANT
CATH URETL OPEN 5X70 (CATHETERS) IMPLANT
CNTNR URN SCR LID CUP LEK RST (MISCELLANEOUS) IMPLANT
DRAPE UTILITY 15X26 TOWEL STRL (DRAPES) ×1 IMPLANT
DRSG TEGADERM 2-3/8X2-3/4 SM (GAUZE/BANDAGES/DRESSINGS) IMPLANT
FIBER LASER MOSES 200 DFL (Laser) IMPLANT
FIBER LASER MOSES 365 DFL (Laser) IMPLANT
GLOVE BIOGEL PI IND STRL 7.5 (GLOVE) ×1 IMPLANT
GOWN STRL REUS W/ TWL LRG LVL3 (GOWN DISPOSABLE) ×1 IMPLANT
GOWN STRL REUS W/ TWL XL LVL3 (GOWN DISPOSABLE) ×1 IMPLANT
GUIDEWIRE STR DUAL SENSOR (WIRE) ×1 IMPLANT
KIT TURNOVER CYSTO (KITS) ×1 IMPLANT
PACK CYSTO AR (MISCELLANEOUS) ×1 IMPLANT
SET CYSTO W/LG BORE CLAMP LF (SET/KITS/TRAYS/PACK) ×1 IMPLANT
SHEATH NAVIGATOR HD 12/14X36 (SHEATH) IMPLANT
SOL .9 NS 3000ML IRR UROMATIC (IV SOLUTION) ×1 IMPLANT
STENT URET 6FRX22 CONTOUR (STENTS) IMPLANT
STENT URET 6FRX24 CONTOUR (STENTS) IMPLANT
STENT URET 6FRX26 CONTOUR (STENTS) IMPLANT
SURGILUBE 2OZ TUBE FLIPTOP (MISCELLANEOUS) ×1 IMPLANT
SYR 10ML LL (SYRINGE) ×1 IMPLANT
VALVE UROSEAL ADJ ENDO (VALVE) IMPLANT
WATER STERILE IRR 500ML POUR (IV SOLUTION) ×1 IMPLANT

## 2024-03-02 NOTE — Interval H&P Note (Signed)
 UROLOGY H&P UPDATE  Agree with prior H&P dated 02/28/2024.  Patient with recurrent UTI, 9 mm left renal stone, new left flank pain and ultrasound showing left-sided hydronephrosis, suspect 9 mm stone has migrated to the left ureter.  On culture appropriate antibiotics, no fevers, chills, UTI symptoms.  Cardiac: RRR Lungs: CTA bilaterally  Laterality: Left Procedure: Cystoscopy, left ureteroscopy, laser lithotripsy, stent placement  We specifically discussed the risks ureteroscopy including bleeding, infection/sepsis, stent related symptoms including flank pain/urgency/frequency/incontinence/dysuria, ureteral injury, ureteral stricture, inability to access stone, or need for staged or additional procedures.   Amy JAYSON Burnet, MD 03/02/2024

## 2024-03-02 NOTE — Anesthesia Procedure Notes (Signed)
 Procedure Name: Intubation Date/Time: 03/02/2024 2:57 PM  Performed by: Blair Suzen BRAVO, RNPre-anesthesia Checklist: Patient identified, Emergency Drugs available, Suction available and Patient being monitored Patient Re-evaluated:Patient Re-evaluated prior to induction Oxygen Delivery Method: Circle system utilized Preoxygenation: Pre-oxygenation with 100% oxygen Induction Type: IV induction Ventilation: Mask ventilation without difficulty Laryngoscope Size: Mac and 3 Grade View: Grade I Tube type: Oral Tube size: 7.0 mm Number of attempts: 1 Airway Equipment and Method: Stylet and Oral airway Placement Confirmation: ETT inserted through vocal cords under direct vision, positive ETCO2 and breath sounds checked- equal and bilateral Secured at: 22 cm Tube secured with: Tape Dental Injury: Teeth and Oropharynx as per pre-operative assessment

## 2024-03-02 NOTE — Transfer of Care (Signed)
 Immediate Anesthesia Transfer of Care Note  Patient: Amy Hansen  Procedure(s) Performed: CYSTOSCOPY/URETEROSCOPY/HOLMIUM LASER/STENT PLACEMENT (Left: Ureter)  Patient Location: PACU  Anesthesia Type:General  Level of Consciousness: drowsy and patient cooperative  Airway & Oxygen Therapy: Patient Spontanous Breathing and Patient connected to face mask oxygen  Post-op Assessment: Report given to RN and Post -op Vital signs reviewed and stable  Post vital signs: Reviewed and stable  Last Vitals:  Vitals Value Taken Time  BP 115/72 03/02/24 15:45  Temp 36.3 C 03/02/24 15:39  Pulse 78 03/02/24 15:47  Resp 15 03/02/24 15:47  SpO2 100 % 03/02/24 15:47  Vitals shown include unfiled device data.  Last Pain:  Vitals:   03/02/24 1048  TempSrc: Temporal  PainSc: 7          Complications: No notable events documented.

## 2024-03-02 NOTE — OR Nursing (Signed)
 Patient right great toe noted extemely red in color; patient unable to tolerate sock over the extremity.  Patient states she has been on antibiotics for ingrown toenail procedure for several weeks.  Follow up appointment was canceled due to OR procedure today.  Dr Francisca at bedside; patient states that the NP in the office stated that IV antibiotics would be used during the procedure to help with infection and would possibly help her toe at the same time.

## 2024-03-02 NOTE — Anesthesia Preprocedure Evaluation (Signed)
 Anesthesia Evaluation  Patient identified by MRN, date of birth, ID band Patient awake    Reviewed: Allergy & Precautions, H&P , NPO status , Patient's Chart, lab work & pertinent test results, reviewed documented beta blocker date and time   History of Anesthesia Complications (+) PONV and history of anesthetic complications  Airway Mallampati: II  TM Distance: >3 FB Neck ROM: full    Dental  (+) Dental Advidsory Given, Edentulous Upper, Upper Dentures, Missing, Poor Dentition   Pulmonary neg shortness of breath, pneumonia, resolved, neg COPD, neg recent URI, former smoker   Pulmonary exam normal breath sounds clear to auscultation       Cardiovascular Exercise Tolerance: Good hypertension, (-) angina + CAD, + Cardiac Stents and +CHF  (-) Past MI Normal cardiovascular exam(-) dysrhythmias (-) Valvular Problems/Murmurs Rhythm:regular Rate:Normal  04-10-23 echo 1. Left ventricular ejection fraction, by estimation, is 60 to 65%. The  left ventricle has normal function. The left ventricle has no regional  wall motion abnormalities. Left ventricular diastolic parameters are  consistent with Grade I diastolic  dysfunction (impaired relaxation).   2. Right ventricular systolic function is normal. The right ventricular  size is normal. Tricuspid regurgitation signal is inadequate for assessing  PA pressure.   3. The mitral valve is normal in structure. Mild to moderate mitral valve  regurgitation. No evidence of mitral stenosis. Moderate mitral annular  calcification.   4. Tricuspid valve regurgitation is mild to moderate.   5. The aortic valve is tricuspid. Aortic valve regurgitation is not  visualized. No aortic stenosis is present.   6. The inferior vena cava is normal in size with greater than 50%  respiratory variability, suggesting right atrial pressure of 3 mmHg.   05-15-23 heart cath Conclusions: 1. Severe single-vessel  coronary artery disease with 90% stenosis of proximal OM1.  There is mild-moderate disease involving the LAD and RCA.  Sluggish flow in the distal LAD on initial images resolved with intracoronary nitroglycerin  suggesting some element of coronary vasospasm. 2. Normal left ventricular systolic function (LVEF 55-65%) with upper normal filling pressure (LVEDP 15 mmHg). 3. Successful PCI to OM1 using Onyx Frontier 2.25 x 18 mm drug-eluting stent (postdilated to 2.6 mm) with 0% residual stenosis and TIMI-3 flow. 4. Small right radial artery necessitating use of 69F sheath/catheters.  Consider alternative access for future catheterizations, particularly if larger catheters are necessary.   Recommendations: 1. Overnight observation. 2. Dual antiplatelet therapy with aspirin  and clopidogrel  for at least 6 months. 3. Aggressive secondary prevention of coronary artery disease.  Will continue low-dose rosuvastatin  given very low LDL and concern for myopathy with higher intensity statin therapy.   Lonni Hanson, MD    Neuro/Psych  Headaches, neg Seizures PSYCHIATRIC DISORDERS Anxiety Depression    TIA Neuromuscular disease (neuropathy)    GI/Hepatic PUD,GERD  Medicated and Controlled,,(+) Cirrhosis       , Hepatitis - (primary biliary cholangitis)  Endo/Other  diabetes, Type 2, Oral Hypoglycemic Agents    Renal/GU CRFRenal disease  negative genitourinary   Musculoskeletal   Abdominal   Peds  Hematology negative hematology ROS (+)   Anesthesia Other Findings   Anxiety Depression Diabetes (HCC) Frequent headaches Recurrent UTI Urinary frequency Chronic kidney disease Anemia Diverticulosis Restless leg syndrome Interstitial cystitis Crohn's disease (HCC) Colitis Arthritis Blood transfusion without reported diagnosis Cataract CHF (congestive heart failure) (HCC) TIA (transient ischemic attack) Acute on chronic heart failure with preserved ejection fraction (HFpEF) AKI (acute kidney  injury) PONV (postoperative nausea and vomiting)  Primary biliary cirrhosis (HCC) Elevated liver enzymes      Reproductive/Obstetrics negative OB ROS                              Anesthesia Physical Anesthesia Plan  ASA: 3  Anesthesia Plan: General   Post-op Pain Management:    Induction: Intravenous  PONV Risk Score and Plan: 4 or greater and Ondansetron , Dexamethasone , Midazolam  and Treatment may vary due to age or medical condition  Airway Management Planned: Oral ETT and LMA  Additional Equipment:   Intra-op Plan:   Post-operative Plan: Extubation in OR  Informed Consent: I have reviewed the patients History and Physical, chart, labs and discussed the procedure including the risks, benefits and alternatives for the proposed anesthesia with the patient or authorized representative who has indicated his/her understanding and acceptance.     Dental Advisory Given  Plan Discussed with: Anesthesiologist, CRNA and Surgeon  Anesthesia Plan Comments: (Patient consented for risks of anesthesia including but not limited to:  - adverse reactions to medications - damage to eyes, teeth, lips or other oral mucosa - nerve damage due to positioning  - sore throat or hoarseness - Damage to heart, brain, nerves, lungs, other parts of body or loss of life  Patient voiced understanding and assent.)         Anesthesia Quick Evaluation

## 2024-03-02 NOTE — Op Note (Signed)
 Date of procedure: 03/02/24  Preoperative diagnosis:  Left renal stone  Postoperative diagnosis:  Same  Procedure: Cystoscopy, left ureteroscopy, laser lithotripsy of renal stone, left retrograde pyelogram with intraoperative interpretation, left ureteral stent placement  Surgeon: Redell Burnet, MD  Anesthesia: General  Complications: None  Intraoperative findings:  Normal bladder, uncomplicated dusting of left upper pole stone  EBL: Minimal  Specimens: None  Drains: Left 6 French by 22 cm ureteral stent  Indication: Amy Hansen is a 68 y.o. patient with left-sided flank pain, prior CT showing a 9 mm left upper pole stone, and ultrasound showing left hydronephrosis.  She opted for ureteroscopy for suspected ureteral stone.  After reviewing the management options for treatment, they elected to proceed with the above surgical procedure(s). We have discussed the potential benefits and risks of the procedure, side effects of the proposed treatment, the likelihood of the patient achieving the goals of the procedure, and any potential problems that might occur during the procedure or recuperation. Informed consent has been obtained.  Description of procedure:  The patient was taken to the operating room and general anesthesia was induced. SCDs were placed for DVT prophylaxis. The patient was placed in the dorsal lithotomy position, prepped and draped in the usual sterile fashion, and preoperative antibiotics were administered. A preoperative time-out was performed.   A 21 French rigid cystoscope was used to intubate the urethra and thorough cystoscopy was performed.  The bladder was grossly normal.  A sensor wire was advanced into the left ureteral orifice and passed up to the kidney under fluoroscopic vision.  A semirigid long ureteroscope was advanced alongside the wire and normal-appearing ureter was followed up to the kidney.  There was no evidence of ureteral stricture or stone.  I  then advanced a digital single-channel flexible ureteroscope over the wire and thorough pyeloscopy was performed.  There was a 9 mm stone in the left upper pole.  A 365 m laser fiber on settings of 0.8 J and 80 Hz was used to methodically dust the stone.  All fragments were smaller than the laser fiber.  Retrograde pyelogram performed from the proximal ureter showed no extravasation or filling defects.  A wire was replaced through the scope, pullback ureteroscopy showed no other abnormalities.  A 6 French by 22 cm ureteral stent was placed fluoroscopically with a shepherd's hook in the lower pole and a curl in the bladder.  The bladder was drained, Dangler secured to the groin and this concluded our procedure.  Disposition: Stable to PACU  Plan: Can remove stent at home on Saturday morning  Redell Burnet, MD

## 2024-03-03 ENCOUNTER — Encounter: Payer: Self-pay | Admitting: Urology

## 2024-03-03 MED ORDER — ONDANSETRON HCL 4 MG PO TABS: 4.0000 mg | ORAL_TABLET | Freq: Four times a day (QID) | ORAL | 0 refills | Status: AC

## 2024-03-03 MED ORDER — NITROFURANTOIN MACROCRYSTAL 100 MG PO CAPS: 100.0000 mg | ORAL_CAPSULE | Freq: Two times a day (BID) | ORAL | 0 refills | Status: AC

## 2024-03-03 NOTE — Progress Notes (Signed)
 Patient verbalized that she had a fever of 102 last night and that she called the Dr's office, she also stated that the office called her back this morning and told her that they had sent an antibiotic prescription to the pharmacy,  she stated that her fever came down to 99 today. Patient was advised to call the office if she had any questions or concerns in the near future.

## 2024-03-03 NOTE — Addendum Note (Signed)
 Addended byBETHA CORIE PLATER on: 03/03/2024 02:41 PM   Modules accepted: Orders

## 2024-03-03 NOTE — Telephone Encounter (Signed)
 Returning HM call.

## 2024-03-04 ENCOUNTER — Telehealth: Payer: Self-pay

## 2024-03-04 NOTE — Telephone Encounter (Signed)
 Pt LVM on the triage line stating she just had surgery and is having a lot of pain wanted someone to call her back.  LVM for pt to return all to triage symptoms.

## 2024-03-05 ENCOUNTER — Ambulatory Visit

## 2024-03-05 NOTE — Anesthesia Postprocedure Evaluation (Signed)
 Anesthesia Post Note  Patient: Amy Hansen  Procedure(s) Performed: CYSTOSCOPY/URETEROSCOPY/HOLMIUM LASER/STENT PLACEMENT (Left: Ureter)  Patient location during evaluation: PACU Anesthesia Type: General Level of consciousness: awake Pain management: satisfactory to patient Vital Signs Assessment: post-procedure vital signs reviewed and stable Respiratory status: spontaneous breathing Cardiovascular status: stable Anesthetic complications: no   No notable events documented.   Last Vitals:  Vitals:   03/02/24 1630 03/02/24 1649  BP: (!) 126/97 (!) 145/84  Pulse: 87 86  Resp: 16 18  Temp: 36.6 C 36.7 C  SpO2: 99% 100%    Last Pain:  Vitals:   03/03/24 1540  TempSrc:   PainSc: 0-No pain                 VAN STAVEREN,Brandyce Dimario

## 2024-03-06 ENCOUNTER — Ambulatory Visit: Admitting: Physician Assistant

## 2024-03-07 ENCOUNTER — Other Ambulatory Visit: Payer: Self-pay | Admitting: Nurse Practitioner

## 2024-03-07 DIAGNOSIS — Z794 Long term (current) use of insulin: Secondary | ICD-10-CM

## 2024-03-09 DIAGNOSIS — R519 Headache, unspecified: Secondary | ICD-10-CM | POA: Diagnosis not present

## 2024-03-09 DIAGNOSIS — K1370 Unspecified lesions of oral mucosa: Secondary | ICD-10-CM | POA: Diagnosis not present

## 2024-03-09 DIAGNOSIS — L989 Disorder of the skin and subcutaneous tissue, unspecified: Secondary | ICD-10-CM | POA: Diagnosis not present

## 2024-03-10 DIAGNOSIS — H2513 Age-related nuclear cataract, bilateral: Secondary | ICD-10-CM | POA: Diagnosis not present

## 2024-03-10 DIAGNOSIS — H5711 Ocular pain, right eye: Secondary | ICD-10-CM | POA: Diagnosis not present

## 2024-03-10 NOTE — Telephone Encounter (Signed)
 Refused Mounjaro  5 mg because it was discontinued on 01/16/2024 due to a dose change.   Now on 7.5 mg

## 2024-03-12 ENCOUNTER — Ambulatory Visit (INDEPENDENT_AMBULATORY_CARE_PROVIDER_SITE_OTHER)

## 2024-03-12 VITALS — BP 121/76 | HR 79 | Temp 97.9°F | Resp 18 | Ht 60.0 in | Wt 117.0 lb

## 2024-03-12 DIAGNOSIS — K5100 Ulcerative (chronic) pancolitis without complications: Secondary | ICD-10-CM | POA: Diagnosis not present

## 2024-03-12 MED ORDER — VEDOLIZUMAB 300 MG IV SOLR
300.0000 mg | Freq: Once | INTRAVENOUS | Status: AC
  Administered 2024-03-12: 300 mg via INTRAVENOUS
  Filled 2024-03-12: qty 5

## 2024-03-12 NOTE — Progress Notes (Signed)
 Diagnosis: Crohn's Disease  Provider:  Praveen Mannam MD  Procedure: IV Infusion  IV Type: Peripheral, IV Location: L Antecubital  Entyvio  (Vedolizumab ), Dose: 300 mg  Infusion Start Time: 1434  Infusion Stop Time: 1512  Post Infusion IV Care: Peripheral IV Discontinued  Discharge: Condition: Good, Destination: Home . AVS Provided  Performed by:  Donny Childes, RN

## 2024-03-16 ENCOUNTER — Encounter: Payer: Self-pay | Admitting: Anesthesiology

## 2024-03-16 ENCOUNTER — Ambulatory Visit: Admission: RE | Admit: 2024-03-16 | Source: Home / Self Care | Admitting: Ophthalmology

## 2024-03-16 HISTORY — DX: Other ill-defined heart diseases: I51.89

## 2024-03-16 HISTORY — DX: Other specified postprocedural states: Z98.890

## 2024-03-16 HISTORY — DX: Abnormal levels of other serum enzymes: R74.8

## 2024-03-16 HISTORY — DX: Primary biliary cirrhosis: K74.3

## 2024-03-16 HISTORY — DX: Nausea with vomiting, unspecified: R11.2

## 2024-03-16 SURGERY — PHACOEMULSIFICATION, CATARACT, WITH IOL INSERTION
Anesthesia: Topical | Laterality: Left

## 2024-03-18 ENCOUNTER — Encounter: Payer: Self-pay | Admitting: Family Medicine

## 2024-03-18 ENCOUNTER — Ambulatory Visit: Payer: Self-pay

## 2024-03-18 ENCOUNTER — Ambulatory Visit (INDEPENDENT_AMBULATORY_CARE_PROVIDER_SITE_OTHER): Admitting: Family Medicine

## 2024-03-18 VITALS — BP 137/84 | HR 80 | Temp 98.2°F | Ht 60.0 in | Wt 114.6 lb

## 2024-03-18 DIAGNOSIS — G2581 Restless legs syndrome: Secondary | ICD-10-CM | POA: Diagnosis not present

## 2024-03-18 DIAGNOSIS — D509 Iron deficiency anemia, unspecified: Secondary | ICD-10-CM | POA: Diagnosis not present

## 2024-03-18 DIAGNOSIS — E559 Vitamin D deficiency, unspecified: Secondary | ICD-10-CM | POA: Diagnosis not present

## 2024-03-18 MED ORDER — ROPINIROLE HCL 1 MG PO TABS: 1.0000 mg | ORAL_TABLET | Freq: Three times a day (TID) | ORAL | 1 refills | Status: DC

## 2024-03-18 NOTE — Progress Notes (Signed)
 BP 137/84   Pulse 80   Temp 98.2 F (36.8 C) (Oral)   Ht 5' (1.524 m)   Wt 114 lb 9.6 oz (52 kg)   LMP  (LMP Unknown)   SpO2 100%   BMI 22.38 kg/m    Subjective:    Patient ID: Amy Hansen, female    DOB: 05-Aug-1955, 68 y.o.   MRN: [id number]  HPI: LADELL BEY is a 68 y.o. female  Chief Complaint  Patient presents with   Restless leg syndrome    RESTLESS LEGS Duration: chronic but significantly worse in the past week Discomfort description:  Creeping and crawling Pain: yes Location: lower legs Bilateral: yes Symmetric: yes Severity: severe Onset:  sudden Frequency:  constant Symptoms only occur while legs at rest: yes Sudden unintentional leg jerking: yes Bed partner bothered by leg movements: yes LE numbness: yes Decreased sensation: yes Weakness: yes Insomnia: yes Daytime somnolence: no Fatigue: yes Status: worse Treatments attempted: iron infusions and requip   Relevant past medical, surgical, family and social history reviewed and updated as indicated. Interim medical history since our last visit reviewed. Allergies and medications reviewed and updated.  Review of Systems  Constitutional: Negative.   Respiratory: Negative.    Cardiovascular: Negative.   Musculoskeletal:  Positive for myalgias. Negative for arthralgias, back pain, gait problem, joint swelling, neck pain and neck stiffness.  Skin: Negative.   Neurological:  Positive for numbness. Negative for dizziness, tremors, seizures, syncope, facial asymmetry, speech difficulty, weakness, light-headedness and headaches.  Psychiatric/Behavioral: Negative.      Per HPI unless specifically indicated above     Objective:    BP 137/84   Pulse 80   Temp 98.2 F (36.8 C) (Oral)   Ht 5' (1.524 m)   Wt 114 lb 9.6 oz (52 kg)   LMP  (LMP Unknown)   SpO2 100%   BMI 22.38 kg/m   Wt Readings from Last 3 Encounters:  03/18/24 114 lb 9.6 oz (52 kg)  03/12/24 117 lb (53.1 kg)  03/02/24 113 lb  (51.3 kg)    Physical Exam Vitals and nursing note reviewed.  Constitutional:      General: She is not in acute distress.    Appearance: Normal appearance. She is not ill-appearing, toxic-appearing or diaphoretic.     Comments: Visibly uncomfortable at this time  HENT:     Head: Normocephalic and atraumatic.     Right Ear: External ear normal.     Left Ear: External ear normal.     Nose: Nose normal.     Mouth/Throat:     Mouth: Mucous membranes are moist.     Pharynx: Oropharynx is clear.  Eyes:     General: No scleral icterus.       Right eye: No discharge.        Left eye: No discharge.     Extraocular Movements: Extraocular movements intact.     Conjunctiva/sclera: Conjunctivae normal.     Pupils: Pupils are equal, round, and reactive to light.  Cardiovascular:     Rate and Rhythm: Normal rate and regular rhythm.     Pulses: Normal pulses.     Heart sounds: Normal heart sounds. No murmur heard.    No friction rub. No gallop.  Pulmonary:     Effort: Pulmonary effort is normal. No respiratory distress.     Breath sounds: Normal breath sounds. No stridor. No wheezing, rhonchi or rales.  Chest:     Chest wall: No tenderness.  Musculoskeletal:        General: Normal range of motion.     Cervical back: Normal range of motion and neck supple.  Skin:    General: Skin is warm and dry.     Capillary Refill: Capillary refill takes less than 2 seconds.     Coloration: Skin is not jaundiced or pale.     Findings: No bruising, erythema, lesion or rash.  Neurological:     General: No focal deficit present.     Mental Status: She is alert and oriented to person, place, and time. Mental status is at baseline.  Psychiatric:        Mood and Affect: Mood normal.        Behavior: Behavior normal.        Thought Content: Thought content normal.        Judgment: Judgment normal.     Results for orders placed or performed during the hospital encounter of 03/02/24  Glucose, capillary    Collection Time: 03/02/24 10:29 AM  Result Value Ref Range   Glucose-Capillary 156 (H) 70 - 99 mg/dL  Glucose, capillary   Collection Time: 03/02/24  3:43 PM  Result Value Ref Range   Glucose-Capillary 93 70 - 99 mg/dL   *Note: Due to a large number of results and/or encounters for the requested time period, some results have not been displayed. A complete set of results can be found in Results Review.      Assessment & Plan:   Problem List Items Addressed This Visit       Other   Restless legs syndrome (RLS) - Primary   Acutely exacerbated. Will check labs to look for cause- has history of iron deficency requiring infusions and vitamin D  def. Will also check for tick diseases. Increase requip  to 1mg  TID. Follow up with PCP next month as scheduled.       Relevant Orders   Comprehensive metabolic panel with GFR   Magnesium    Phosphorus   Lyme Disease Serology w/Reflex   Spotted Fever Group Antibodies   TSH   Vitamin D  deficiency   Relevant Orders   VITAMIN D  25 Hydroxy (Vit-D Deficiency, Fractures)   Iron deficiency anemia   Relevant Orders   CBC with Differential/Platelet   Ferritin   Iron Binding Cap (TIBC)([facility])     Follow up plan: Return for As scheduled.

## 2024-03-18 NOTE — Telephone Encounter (Signed)
 FYI Only or Action Required?: FYI only for provider.  Patient was last seen in primary care on 01/16/2024 by Melvin Pao, NP.  Called Nurse Triage reporting Leg Pain.  Symptoms began Sunday.  Interventions attempted: Prescription medications: Requip and Rest, hydration, or home remedies.  Symptoms are: gradually worsening.  Triage Disposition: See HCP Within 4 Hours (Or PCP Triage)  Patient/caregiver understands and will follow disposition?: Yes  Copied from CRM #[id number]. Topic: Clinical - Red Word Triage >> Mar 18, 2024  9:15 AM Cynthia K wrote: Red Word that prompted transfer to Nurse Triage:  Her restless legs started Sunday night giving her pain all day and night. Her medications are not working.  Pain level is a 10. Pain in both legs. Reason for Disposition  [1] SEVERE pain (e.g., excruciating, unable to do any normal activities) AND [2] not improved after 2 hours of pain medicine  Answer Assessment - Initial Assessment Questions 1. ONSET: When did the pain start?      Restless leg started back up on Sunday 2. LOCATION: Where is the pain located?      Both legs 3. PAIN: How bad is the pain?    (Scale 1-10; or mild, moderate, severe)     10  4. WORK OR EXERCISE: Has there been any recent work or exercise that involved this part of the body?      no 5. CAUSE: What do you think is causing the leg pain?     Restless leg 6. OTHER SYMPTOMS: Do you have any other symptoms? (e.g., chest pain, back pain, breathing difficulty, swelling, rash, fever, numbness, weakness)     No other symptoms.  Patient reports severe pain with her restless leg. Patient states the sensations have been bad since Sunday. Patient reports she is on medication for restless leg but it hasn't been helping. Same day acute appointment scheduled for 10 AM for today with another provider.  Protocols used: Leg Pain-A-AH

## 2024-03-18 NOTE — Assessment & Plan Note (Signed)
 Acutely exacerbated. Will check labs to look for cause- has history of iron deficency requiring infusions and vitamin D  def. Will also check for tick diseases. Increase requip  to 1mg  TID. Follow up with PCP next month as scheduled.

## 2024-03-20 LAB — CBC WITH DIFFERENTIAL/PLATELET
Basophils Absolute: 0 x10E3/uL (ref 0.0–0.2)
Basos: 1 %
EOS (ABSOLUTE): 0 x10E3/uL (ref 0.0–0.4)
Eos: 0 %
Hematocrit: 34.4 % (ref 34.0–46.6)
Hemoglobin: 10.4 g/dL — ABNORMAL LOW (ref 11.1–15.9)
Immature Grans (Abs): 0 x10E3/uL (ref 0.0–0.1)
Immature Granulocytes: 0 %
Lymphocytes Absolute: 1.4 x10E3/uL (ref 0.7–3.1)
Lymphs: 42 %
MCH: 27.4 pg (ref 26.6–33.0)
MCHC: 30.2 g/dL — ABNORMAL LOW (ref 31.5–35.7)
MCV: 91 fL (ref 79–97)
Monocytes Absolute: 0.3 x10E3/uL (ref 0.1–0.9)
Monocytes: 8 %
Neutrophils Absolute: 1.6 x10E3/uL (ref 1.4–7.0)
Neutrophils: 49 %
Platelets: 395 x10E3/uL (ref 150–450)
RBC: 3.79 x10E6/uL (ref 3.77–5.28)
RDW: 15.8 % — ABNORMAL HIGH (ref 11.7–15.4)
WBC: 3.3 x10E3/uL — ABNORMAL LOW (ref 3.4–10.8)

## 2024-03-20 LAB — COMPREHENSIVE METABOLIC PANEL WITH GFR
ALT: 20 IU/L (ref 0–32)
AST: 20 IU/L (ref 0–40)
Albumin: 3.6 g/dL — ABNORMAL LOW (ref 3.9–4.9)
Alkaline Phosphatase: 214 IU/L — ABNORMAL HIGH (ref 44–121)
BUN/Creatinine Ratio: 26 (ref 12–28)
BUN: 14 mg/dL (ref 8–27)
Bilirubin Total: <0.2 mg/dL (ref 0.0–1.2)
CO2: 21 mmol/L (ref 20–29)
Calcium: 8.5 mg/dL — ABNORMAL LOW (ref 8.7–10.3)
Chloride: 102 mmol/L (ref 96–106)
Creatinine, Ser: 0.54 mg/dL — ABNORMAL LOW (ref 0.57–1.00)
Globulin, Total: 2.5 g/dL (ref 1.5–4.5)
Glucose: 119 mg/dL — ABNORMAL HIGH (ref 70–99)
Potassium: 4.2 mmol/L (ref 3.5–5.2)
Sodium: 137 mmol/L (ref 134–144)
Total Protein: 6.1 g/dL (ref 6.0–8.5)
eGFR: 101 mL/min/1.73 (ref >59)

## 2024-03-20 LAB — IRON AND TIBC
Iron Saturation: 17 % (ref 15–55)
Iron: 51 ug/dL (ref 27–139)
Total Iron Binding Capacity: 305 ug/dL (ref 250–450)
UIBC: 254 ug/dL (ref 118–369)

## 2024-03-20 LAB — LYME DISEASE SEROLOGY W/REFLEX: Lyme Total Antibody EIA: NEGATIVE

## 2024-03-20 LAB — VITAMIN D 25 HYDROXY (VIT D DEFICIENCY, FRACTURES): Vit D, 25-Hydroxy: 11.6 ng/mL — ABNORMAL LOW (ref 30.0–100.0)

## 2024-03-20 LAB — SPOTTED FEVER GROUP ANTIBODIES
Spotted Fever Group IgG: <1:64 {titer}
Spotted Fever Group IgM: <1:64 {titer}

## 2024-03-20 LAB — FERRITIN: Ferritin: 431 ng/mL — ABNORMAL HIGH (ref 15–150)

## 2024-03-20 LAB — TSH: TSH: 0.64 u[IU]/mL (ref 0.450–4.500)

## 2024-03-20 LAB — MAGNESIUM: Magnesium: 2 mg/dL (ref 1.6–2.3)

## 2024-03-20 LAB — PHOSPHORUS: Phosphorus: 3.4 mg/dL (ref 3.0–4.3)

## 2024-03-24 ENCOUNTER — Encounter: Payer: Self-pay | Admitting: Oncology

## 2024-03-24 ENCOUNTER — Encounter: Payer: Self-pay | Admitting: Gastroenterology

## 2024-03-24 NOTE — Telephone Encounter (Signed)
 Spoke to patient who states she will come this week for b12 lab. She has not been taking B12 supplementation thus far.

## 2024-03-24 NOTE — Telephone Encounter (Signed)
 Left message for patient to call back.  B12 orders placed in EPIC.

## 2024-03-24 NOTE — Addendum Note (Signed)
 Addended by: CLAUDENE NAOMIE SAILOR on: 03/24/2024 10:17 AM   Modules accepted: Orders

## 2024-03-24 NOTE — Telephone Encounter (Signed)
===  View-only below this line=== ----- Message ----- From: Leigh Elspeth SQUIBB, MD Sent: 03/23/2024   2:02 PM EDT To: Naomie LOISE Sharps, RN  Thanks Dottie - iron studies now normal but her anemia persists. She should be taking B12 which was low previously. Can you have her come to the lab for a B12 level? Thanks ----- Message ----- From: Sharps Naomie LOISE, RN Sent: 03/23/2024  12:38 PM EDT To: Elspeth SQUIBB Leigh, MD  Dr DELENA- You requested repeat cbc, ibc, ferritin be completed on this patient following iron infusions. Looks like another provider ordered these. Please advise of any recommendations you may have.

## 2024-03-26 ENCOUNTER — Ambulatory Visit: Payer: Self-pay | Admitting: Family Medicine

## 2024-03-26 MED ORDER — VITAMIN D (ERGOCALCIFEROL) 1.25 MG (50000 UNIT) PO CAPS: 50000.0000 [IU] | ORAL_CAPSULE | ORAL | 1 refills | Status: AC

## 2024-03-27 ENCOUNTER — Other Ambulatory Visit: Payer: Self-pay | Admitting: Physician Assistant

## 2024-03-31 ENCOUNTER — Ambulatory Visit: Admitting: Nurse Practitioner

## 2024-03-31 ENCOUNTER — Encounter: Payer: Self-pay | Admitting: Nurse Practitioner

## 2024-03-31 VITALS — BP 128/83 | HR 77 | Temp 97.9°F | Ht 60.0 in | Wt 112.2 lb

## 2024-03-31 DIAGNOSIS — E538 Deficiency of other specified B group vitamins: Secondary | ICD-10-CM

## 2024-03-31 DIAGNOSIS — Z7985 Long-term (current) use of injectable non-insulin antidiabetic drugs: Secondary | ICD-10-CM

## 2024-03-31 DIAGNOSIS — T148XXA Other injury of unspecified body region, initial encounter: Secondary | ICD-10-CM

## 2024-03-31 DIAGNOSIS — E1165 Type 2 diabetes mellitus with hyperglycemia: Secondary | ICD-10-CM | POA: Diagnosis not present

## 2024-03-31 MED ORDER — TRIAMCINOLONE ACETONIDE 0.1 % EX CREA: 1.0000 | TOPICAL_CREAM | Freq: Two times a day (BID) | CUTANEOUS | 0 refills | Status: AC

## 2024-03-31 NOTE — Progress Notes (Signed)
 BP 128/83   Pulse 77   Temp 97.9 F (36.6 C) (Oral)   Ht 5' (1.524 m)   Wt 112 lb 3.2 oz (50.9 kg)   LMP  (LMP Unknown)   SpO2 98%   BMI 21.91 kg/m    Subjective:    Patient ID: Amy Hansen, female    DOB: 1955/09/09, 68 y.o.   MRN: [id number]  HPI: Amy Hansen is a 68 y.o. female  Chief Complaint  Patient presents with   Wound Check    Patient states she has a wound on her R forearm from an IV. States she has been trying all kinds of OTC treatments and they have not helped at all. Patient states she thinks the area is getting worse and is worried about cellulitis.    Patient states she had an IV a couple of weeks ago.  She is afraid that it might be infected.  States she is having a hard time letting it heal.   DIABETES Hypoglycemic episodes:no Polydipsia/polyuria: no Visual disturbance: no Chest pain: no Paresthesias: no Glucose Monitoring: no  Accucheck frequency: TID  Fasting glucose:  Post prandial:  Evening:  Before meals: Taking Insulin ?: no  Long acting insulin :  Short acting insulin :     Relevant past medical, surgical, family and social history reviewed and updated as indicated. Interim medical history since our last visit reviewed. Allergies and medications reviewed and updated.  Review of Systems  Eyes:  Negative for visual disturbance.  Cardiovascular:  Negative for chest pain.  Endocrine: Negative for polydipsia and polyuria.  Skin:        Small cut on right wrist  Neurological:  Negative for numbness.    Per HPI unless specifically indicated above     Objective:    BP 128/83   Pulse 77   Temp 97.9 F (36.6 C) (Oral)   Ht 5' (1.524 m)   Wt 112 lb 3.2 oz (50.9 kg)   LMP  (LMP Unknown)   SpO2 98%   BMI 21.91 kg/m   Wt Readings from Last 3 Encounters:  03/31/24 112 lb 3.2 oz (50.9 kg)  03/18/24 114 lb 9.6 oz (52 kg)  03/12/24 117 lb (53.1 kg)    Physical Exam Vitals and nursing note reviewed.  Constitutional:       General: She is not in acute distress.    Appearance: Normal appearance. She is normal weight. She is not ill-appearing, toxic-appearing or diaphoretic.  HENT:     Head: Normocephalic.     Right Ear: External ear normal.     Left Ear: External ear normal.     Nose: Nose normal.     Mouth/Throat:     Mouth: Mucous membranes are moist.     Pharynx: Oropharynx is clear.  Eyes:     General:        Right eye: No discharge.        Left eye: No discharge.     Extraocular Movements: Extraocular movements intact.     Conjunctiva/sclera: Conjunctivae normal.     Pupils: Pupils are equal, round, and reactive to light.  Cardiovascular:     Rate and Rhythm: Normal rate and regular rhythm.     Heart sounds: No murmur heard. Pulmonary:     Effort: Pulmonary effort is normal. No respiratory distress.     Breath sounds: Normal breath sounds. No wheezing or rales.  Musculoskeletal:     Cervical back: Normal range of motion and neck  supple.  Skin:    General: Skin is warm and dry.     Capillary Refill: Capillary refill takes less than 2 seconds.      Neurological:     General: No focal deficit present.     Mental Status: She is alert and oriented to person, place, and time. Mental status is at baseline.  Psychiatric:        Mood and Affect: Mood normal.        Behavior: Behavior normal.        Thought Content: Thought content normal.        Judgment: Judgment normal.     Results for orders placed or performed in visit on 03/18/24  CBC with Differential/Platelet   Collection Time: 03/18/24 10:48 AM  Result Value Ref Range   WBC 3.3 (L) 3.4 - 10.8 x10E3/uL   RBC 3.79 3.77 - 5.28 x10E6/uL   Hemoglobin 10.4 (L) 11.1 - 15.9 g/dL   Hematocrit 65.5 65.9 - 46.6 %   MCV 91 79 - 97 fL   MCH 27.4 26.6 - 33.0 pg   MCHC 30.2 (L) 31.5 - 35.7 g/dL   RDW 84.1 (H) 88.2 - 84.5 %   Platelets 395 150 - 450 x10E3/uL   Neutrophils 49 Not Estab. %   Lymphs 42 Not Estab. %   Monocytes 8 Not Estab. %    Eos 0 Not Estab. %   Basos 1 Not Estab. %   Neutrophils Absolute 1.6 1.4 - 7.0 x10E3/uL   Lymphocytes Absolute 1.4 0.7 - 3.1 x10E3/uL   Monocytes Absolute 0.3 0.1 - 0.9 x10E3/uL   EOS (ABSOLUTE) 0.0 0.0 - 0.4 x10E3/uL   Basophils Absolute 0.0 0.0 - 0.2 x10E3/uL   Immature Granulocytes 0 Not Estab. %   Immature Grans (Abs) 0.0 0.0 - 0.1 x10E3/uL  Comprehensive metabolic panel with GFR   Collection Time: 03/18/24 10:48 AM  Result Value Ref Range   Glucose 119 (H) 70 - 99 mg/dL   BUN 14 8 - 27 mg/dL   Creatinine, Ser 9.45 (L) 0.57 - 1.00 mg/dL   eGFR 898 >40 fO/fpw/8.26   BUN/Creatinine Ratio 26 12 - 28   Sodium 137 134 - 144 mmol/L   Potassium 4.2 3.5 - 5.2 mmol/L   Chloride 102 96 - 106 mmol/L   CO2 21 20 - 29 mmol/L   Calcium  8.5 (L) 8.7 - 10.3 mg/dL   Total Protein 6.1 6.0 - 8.5 g/dL   Albumin 3.6 (L) 3.9 - 4.9 g/dL   Globulin, Total 2.5 1.5 - 4.5 g/dL   Bilirubin Total <9.7 0.0 - 1.2 mg/dL   Alkaline Phosphatase 214 (H) 44 - 121 IU/L   AST 20 0 - 40 IU/L   ALT 20 0 - 32 IU/L  Ferritin   Collection Time: 03/18/24 10:48 AM  Result Value Ref Range   Ferritin 431 (H) 15 - 150 ng/mL  Iron Binding Cap (TIBC)([facility])   Collection Time: 03/18/24 10:48 AM  Result Value Ref Range   Total Iron Binding Capacity 305 250 - 450 ug/dL   UIBC 745 881 - 630 ug/dL   Iron 51 27 - 860 ug/dL   Iron Saturation 17 15 - 55 %  Magnesium    Collection Time: 03/18/24 10:48 AM  Result Value Ref Range   Magnesium  2.0 1.6 - 2.3 mg/dL  Phosphorus   Collection Time: 03/18/24 10:48 AM  Result Value Ref Range   Phosphorus 3.4 3.0 - 4.3 mg/dL  Lyme Disease Serology w/Reflex  Collection Time: 03/18/24 10:48 AM  Result Value Ref Range   Lyme Total Antibody EIA Negative Negative  Spotted Fever Group Antibodies   Collection Time: 03/18/24 10:48 AM  Result Value Ref Range   Spotted Fever Group IgG <1:64 Neg:<1:64   Spotted Fever Group IgM <1:64 Neg:<1:64   Result Comment Comment    VITAMIN D  25 Hydroxy (Vit-D Deficiency, Fractures)   Collection Time: 03/18/24 10:48 AM  Result Value Ref Range   Vit D, 25-Hydroxy 11.6 (L) 30.0 - 100.0 ng/mL  TSH   Collection Time: 03/18/24 10:48 AM  Result Value Ref Range   TSH 0.640 0.450 - 4.500 uIU/mL   *Note: Due to a large number of results and/or encounters for the requested time period, some results have not been displayed. A complete set of results can be found in Results Review.      Assessment & Plan:   Problem List Items Addressed This Visit       Endocrine   Uncontrolled type 2 diabetes mellitus with hyperglycemia, without long-term current use of insulin  (HCC) - Primary   Chronic. Not well controlled.  Continue with Mounjaro  7.5mg  weekly.  Labs ordered today.  Will make recommendations based on results.       Relevant Orders   Comp Met (CMET)   HgB A1c     Other   B12 deficiency   Labs ordered at visit today.  Will make recommendations based on lab results.  Will send to GI once results have returned.      Relevant Orders   B12   Other Visit Diagnoses       Abrasion       Continue to keep wound clean dry and intact.  Leave open to air.  Discussed signs and symptoms to monitor for.        Follow up plan: Return in about 3 months (around 06/30/2024) for HTN, HLD, DM2 FU.

## 2024-03-31 NOTE — Addendum Note (Signed)
 Addended by: MELVIN PAO on: 03/31/2024 02:00 PM   Modules accepted: Orders

## 2024-03-31 NOTE — Assessment & Plan Note (Signed)
 Chronic. Not well controlled.  Continue with Mounjaro  7.5mg  weekly.  Labs ordered today.  Will make recommendations based on results.

## 2024-03-31 NOTE — Assessment & Plan Note (Signed)
 Labs ordered at visit today.  Will make recommendations based on lab results.  Will send to GI once results have returned.

## 2024-04-01 ENCOUNTER — Ambulatory Visit: Payer: Self-pay | Admitting: Nurse Practitioner

## 2024-04-01 LAB — COMPREHENSIVE METABOLIC PANEL WITH GFR
ALT: 19 IU/L (ref 0–32)
AST: 21 IU/L (ref 0–40)
Albumin: 4.1 g/dL (ref 3.9–4.9)
Alkaline Phosphatase: 217 IU/L — ABNORMAL HIGH (ref 44–121)
BUN/Creatinine Ratio: 11 — ABNORMAL LOW (ref 12–28)
BUN: 8 mg/dL (ref 8–27)
Bilirubin Total: 0.2 mg/dL (ref 0.0–1.2)
CO2: 24 mmol/L (ref 20–29)
Calcium: 9.1 mg/dL (ref 8.7–10.3)
Chloride: 100 mmol/L (ref 96–106)
Creatinine, Ser: 0.72 mg/dL (ref 0.57–1.00)
Globulin, Total: 2.5 g/dL (ref 1.5–4.5)
Glucose: 155 mg/dL — ABNORMAL HIGH (ref 70–99)
Potassium: 4 mmol/L (ref 3.5–5.2)
Sodium: 137 mmol/L (ref 134–144)
Total Protein: 6.6 g/dL (ref 6.0–8.5)
eGFR: 92 mL/min/1.73 (ref >59)

## 2024-04-01 LAB — VITAMIN B12: Vitamin B-12: 1439 pg/mL — ABNORMAL HIGH (ref 232–1245)

## 2024-04-01 LAB — HEMOGLOBIN A1C
Est. average glucose Bld gHb Est-mCnc: 177 mg/dL
Hgb A1c MFr Bld: 7.8 % — ABNORMAL HIGH (ref 4.8–5.6)

## 2024-04-02 ENCOUNTER — Encounter: Payer: Self-pay | Admitting: Physician Assistant

## 2024-04-02 ENCOUNTER — Ambulatory Visit: Admitting: Physician Assistant

## 2024-04-02 ENCOUNTER — Telehealth: Payer: Self-pay | Admitting: Internal Medicine

## 2024-04-02 DIAGNOSIS — F411 Generalized anxiety disorder: Secondary | ICD-10-CM

## 2024-04-02 DIAGNOSIS — G2581 Restless legs syndrome: Secondary | ICD-10-CM

## 2024-04-02 DIAGNOSIS — F319 Bipolar disorder, unspecified: Secondary | ICD-10-CM | POA: Diagnosis not present

## 2024-04-02 DIAGNOSIS — G4700 Insomnia, unspecified: Secondary | ICD-10-CM

## 2024-04-02 DIAGNOSIS — F1320 Sedative, hypnotic or anxiolytic dependence, uncomplicated: Secondary | ICD-10-CM

## 2024-04-02 MED ORDER — QUETIAPINE FUMARATE 300 MG PO TABS: 300.0000 mg | ORAL_TABLET | Freq: Every day | ORAL | 5 refills | Status: DC

## 2024-04-02 MED ORDER — SERTRALINE HCL 100 MG PO TABS: 100.0000 mg | ORAL_TABLET | Freq: Every day | ORAL | 5 refills | Status: DC

## 2024-04-02 MED ORDER — ALPRAZOLAM 0.5 MG PO TABS: 0.5000 mg | ORAL_TABLET | Freq: Two times a day (BID) | ORAL | 4 refills | Status: DC | PRN

## 2024-04-02 NOTE — Telephone Encounter (Signed)
  Pt c/o Syncope: STAT if syncope occurred within 24 hours and pt complains of lightheadedness  Did you pass out today? No, just felt faint   When is the last time you passed out? Have not passed out  Has this occurred multiple times? No, just this morning   Did you have any symptoms prior to passing out?  Patient is calling stating that she had an episode of a sinking feeling like her heart dropped into her stomach. She does state that she has some nausea as well and did get a bit dizzy and fell over on the couch during the episode. She states she did feel like she was kind of faint but it has only happened that one time.  Denies any chest pain or any other symptoms. States she did take her blood pressure and that was fine along with her heart rate being fine.   5. Did you fall? If so, are you on a blood thinner? Only takes an aspirin . Was taken off of Plavix  in May

## 2024-04-02 NOTE — Telephone Encounter (Signed)
 Spoke with the patient on the triage phone.  Patient states that she is on her way to an appointment in [place], and had the symptoms while at home washing dishes.  Patient denies any chest pain, or shortness of breath.  Patient states that heart rate, blood pressure, and O2 sats were normal.  Patient stated feeling like my heart dropped. Patient was asked about what her blood glucose was, and patient stated that it didn't even occur to me to check.  Patient advised to go the emergency room for a thorough evaluation.  Patient states that she will monitor her symptoms and will do so later on if she feels it necessary.  Patient then advised to stop and get an orange juice prior to reaching her appointment and drinking about 8 oz and see if that helps with that feeling.  Patient also advised to stay well hydrated and not drive or do any activity that may require balance and coordination.  Patient was again advised to go to the emergency room for a thorough evaluation.  Patient verbalized understanding with all questions and concerns addressed at this time.

## 2024-04-02 NOTE — Progress Notes (Signed)
 [facility]  Patient ID: Amy Hansen,  MRN: [id number]  PCP: Melvin Pao, NP  Date of Evaluation: 04/02/2024 Time spent:20 minutes  Chief Complaint:  Chief Complaint   Anxiety; Depression; Follow-up    HISTORY/CURRENT STATUS: For routine med check.   She's doing really well!  The past few weeks especially. She has been more able to enjoy things, like baking.  Energy and motivation are better.   No extreme sadness, tearfulness, or feelings of hopelessness.  Sleeps ok. ADLs and personal hygiene are normal.   Denies any changes in concentration, making decisions, or remembering things.  Appetite has not changed.  Weight is stable.  No SI/HI.  Anxiety is well controlled. Still uses the Xanax  prn, but hasn't been needing it as much in the mornings as she did. Still gets overwhelmed with certain situations. Tries to avoid triggered situations as best she can.   Patient denies increased energy with decreased need for sleep, increased talkativeness, racing thoughts, impulsivity or risky behaviors, increased spending, grandiosity, increased irritability or anger, paranoia, or hallucinations.  Individual Medical History/ Review of Systems: Changes? :No      Past medications for mental health diagnoses include: Trazodone , Risperdal , Zoloft , Lunesta, prazosin, Sonata, Prozac, Depakote, Lamictal , lithium, Wellbutrin , Xanax , Ambien , carbamazepine , Seroquel , Buspar  caused falls and hallucinations, Gabapentin -she doesn't want to take  Allergies: Avelox [moxifloxacin hcl in nacl], Bactrim  [sulfamethoxazole -trimethoprim ], Ciprofloxacin , Bacitracin , Buspar  [buspirone ], Neomycin -polymyxin-dexameth, Neosporin original [neomycin -bacitracin  zn-polymyx], Shellfish allergy, Aquaphor [lanolin-petrolatum], Depakote [divalproex sodium], Imitrex  [sumatriptan ], and Stadol [butorphanol]  Current Medications:  Current Outpatient Medications:    albuterol  (PROVENTIL ) (2.5 MG/3ML) 0.083% nebulizer  solution, Take 3 mLs (2.5 mg total) by nebulization every 6 (six) hours as needed for wheezing or shortness of breath., Disp: 150 mL, Rfl: 3   albuterol  (VENTOLIN  HFA) 108 (90 Base) MCG/ACT inhaler, Inhale 2 puffs into the lungs every 6 (six) hours as needed for wheezing or shortness of breath., Disp: 18 g, Rfl: 5   ALPHA LIPOIC ACID PO, Take 1 capsule by mouth daily., Disp: , Rfl:    aspirin  EC 81 MG tablet, Take 1 tablet (81 mg) by mouth once daily. Swallow whole., Disp: , Rfl:    Cyanocobalamin  (VITAMIN B-12 PO), Take 1 tablet by mouth daily., Disp: , Rfl:    estradiol  (ESTRACE ) 0.1 MG/GM vaginal cream, Apply one pea-sized amount around the opening of the urethra 3 times weekly., Disp: 42.5 g, Rfl: 12   furosemide  (LASIX ) 20 MG tablet, Take 1 tablet (20 mg total) by mouth daily as needed (for swelling in the legs accompanied by a weight gain of 3 pounds overnight -- USE SPARINGLY)., Disp: 45 tablet, Rfl: 1   isosorbide  mononitrate (IMDUR ) 30 MG 24 hr tablet, Take 0.5 tablets (15 mg total) by mouth daily., Disp: 45 tablet, Rfl: 3   metoprolol  tartrate (LOPRESSOR ) 25 MG tablet, Take 25 mg by mouth 2 (two) times daily., Disp: , Rfl:    Nebulizers (COMPRESSOR/NEBULIZER) MISC, 1 each by Does not apply route as directed., Disp: 1 each, Rfl: 0   nitroGLYCERIN  (NITROSTAT ) 0.4 MG SL tablet, Place 1 tablet (0.4 mg total) under the tongue every 5 (five) minutes as needed., Disp: 25 tablet, Rfl: 3   ondansetron  (ZOFRAN ) 4 MG tablet, Take 1 tablet (4 mg total) by mouth every 6 (six) hours., Disp: 10 tablet, Rfl: 0   ondansetron  (ZOFRAN -ODT) 8 MG disintegrating tablet, Take 1 tablet (8 mg total) by mouth every 8 (eight) hours as needed for nausea or vomiting., Disp: 60 tablet,  Rfl: 1   pantoprazole  (PROTONIX ) 40 MG tablet, Take 1 tablet (40 mg total) by mouth daily., Disp: 90 tablet, Rfl: 3   pramipexole  (MIRAPEX ) 1 MG tablet, Take 1 tablet (1 mg total) by mouth at bedtime., Disp: 90 tablet, Rfl: 1    prednisoLONE acetate (PRED FORTE) 1 % ophthalmic suspension, Place 1 drop into both eyes 2 (two) times daily., Disp: , Rfl:    rOPINIRole  (REQUIP ) 1 MG tablet, Take 1 tablet (1 mg total) by mouth 3 (three) times daily., Disp: 90 tablet, Rfl: 1   tirzepatide  (MOUNJARO ) 7.5 MG/0.5ML Pen, Inject 7.5 mg into the skin once a week., Disp: 6 mL, Rfl: 1   triamcinolone  cream (KENALOG ) 0.1 %, Apply 1 Application topically 2 (two) times daily., Disp: 30 g, Rfl: 0   ursodiol (ACTIGALL) 300 MG capsule, Take 300 mg by mouth 2 (two) times daily. Take 2 capsules in the AM and 1 capsule in the PM, Disp: , Rfl:    vedolizumab  (ENTYVIO ) 300 MG injection, Inject 300 mg into the vein every 30 (thirty) days., Disp: , Rfl:    Vitamin D , Ergocalciferol , (DRISDOL ) 1.25 MG (50000 UNIT) CAPS capsule, Take 1 capsule (50,000 Units total) by mouth every 7 (seven) days., Disp: 12 capsule, Rfl: 1   ALPRAZolam  (XANAX ) 0.5 MG tablet, Take 1 tablet (0.5 mg total) by mouth 2 (two) times daily as needed for anxiety. Must last 15 days., Disp: 30 tablet, Rfl: 4   amoxicillin -clavulanate (AUGMENTIN ) 875-125 MG tablet, Take 1 tablet by mouth 2 (two) times daily., Disp: 10 tablet, Rfl: 0   oxyCODONE  (ROXICODONE ) 5 MG immediate release tablet, Take 1-2 tablets (5-10 mg total) by mouth every 6 (six) hours as needed for severe pain (pain score 7-10). (Patient not taking: Reported on 04/10/2024), Disp: 20 tablet, Rfl: 0   QUEtiapine  (SEROQUEL ) 300 MG tablet, Take 1 tablet (300 mg total) by mouth at bedtime., Disp: 30 tablet, Rfl: 5   rosuvastatin  (CRESTOR ) 5 MG tablet, TAKE 1 TABLET DAILY, Disp: 90 tablet, Rfl: 0   sertraline  (ZOLOFT ) 100 MG tablet, Take 1 tablet (100 mg total) by mouth daily., Disp: 30 tablet, Rfl: 5   trimethoprim -polymyxin b (POLYTRIM) ophthalmic solution, SMARTSIG:In Eye(s), Disp: , Rfl:  Medication Side Effects: none  Family Medical/ Social History: Changes?  no  MENTAL HEALTH EXAM:  There were no vitals taken for this  visit.There is no height or weight on file to calculate BMI.  General Appearance: Casual and Well Groomed  Eye Contact:  Good  Speech:  Clear and Coherent and Normal Rate  Volume:  Normal  Mood:  Euthymic  Affect:  Congruent  Thought Process:  Goal Directed and Descriptions of Associations: Circumstantial  Orientation:  Full (Time, Place, and Person)  Thought Content: Logical   Suicidal Thoughts:  No  Homicidal Thoughts:  No  Memory:  at her baseline  Judgement:  Good  Insight:  Good  Psychomotor Activity:  Normal  Concentration:  Concentration: Good and Attention Span: Good  Recall:  Good  Fund of Knowledge: Good  Language: Good  Assets:  Communication Skills Desire for Improvement Financial Resources/Insurance Housing Leisure Time Resilience Social Support Transportation   ADL's:  Intact  Cognition: WNL  Prognosis:  Good   PCP follows labs  DIAGNOSES:    ICD-10-CM   1. Generalized anxiety disorder  F41.1     2. Bipolar I disorder (HCC)  F31.9     3. Insomnia, unspecified type  G47.00     4. Restless  legs syndrome (RLS)  G25.81     5. Benzodiazepine dependence (HCC)  F13.20      Receiving Psychotherapy: No   RECOMMENDATIONS:  PDMP reviewed.  Xanax  filled 03/31/2024.  H oxycodone  filled 03/02/2024. I provided approximately 20  minutes of face to face time during this encounter, including time spent before and after the visit in records review, medical decision making, counseling pertinent to today's visit, and charting.   She's doing well with current meds so no changes are needed.  Continue Xanax  0.5 mg, 1 p.o. twice daily as needed.  DON'T FILL EARLY. EVER.   Continue pramipexole  1 mg, 1 p.o. nightly. Continue Seroquel  300 mg, 1 at bedtime.  Continue ropinirole  1 mg, 1 p.o. twice daily. Continue Zoloft  100 mg p.o. daily. Return in 2 months.     Verneita Cooks, PA-C

## 2024-04-06 ENCOUNTER — Ambulatory Visit: Admit: 2024-04-06 | Admitting: Ophthalmology

## 2024-04-06 SURGERY — PHACOEMULSIFICATION, CATARACT, WITH IOL INSERTION
Anesthesia: Topical | Laterality: Right

## 2024-04-07 ENCOUNTER — Other Ambulatory Visit: Payer: Self-pay | Admitting: Nurse Practitioner

## 2024-04-08 NOTE — Telephone Encounter (Signed)
 Lipid panel in date.  Requested Prescriptions  Pending Prescriptions Disp Refills   rosuvastatin  (CRESTOR ) 5 MG tablet [Pharmacy Med Name: ROSUVASTATIN  CALCIUM  5 MG TAB] 90 tablet 0    Sig: TAKE 1 TABLET DAILY     Cardiovascular:  Antilipid - Statins 2 Failed - 04/08/2024  2:02 PM      Failed - Lipid Panel in normal range within the last 12 months    Cholesterol, Total  Date Value Ref Range Status  12/11/2023 120 100 - 199 mg/dL Final   Cholesterol  Date Value Ref Range Status  01/10/2012 148 0 - 200 mg/dL Final   Cholesterol Piccolo, Waived  Date Value Ref Range Status  07/17/2016 139 <200 mg/dL Final    Comment:                            Desirable                <200                         Borderline High      200- 239                         High                     >239    Ldl Cholesterol, Calc  Date Value Ref Range Status  01/10/2012 79 0 - 100 mg/dL Final   LDL Chol Calc (NIH)  Date Value Ref Range Status  12/11/2023 45 0 - 99 mg/dL Final   HDL Cholesterol  Date Value Ref Range Status  01/10/2012 47 40 - 60 mg/dL Final   HDL  Date Value Ref Range Status  12/11/2023 50 >39 mg/dL Final   Triglycerides  Date Value Ref Range Status  12/11/2023 150 (H) 0 - 149 mg/dL Final  93/86/7986 889 0 - 200 mg/dL Final   Triglycerides Piccolo,Waived  Date Value Ref Range Status  07/17/2016 99 <150 mg/dL Final    Comment:                            Normal                   <150                         Borderline High     150 - 199                         High                200 - 499                         Very High                >499          Passed - Cr in normal range and within 360 days    Creatinine  Date Value Ref Range Status  01/10/2012 0.58 (L) 0.60 - 1.30 mg/dL Final   Creatinine, Ser  Date Value Ref Range Status  03/31/2024 0.72 0.57 - 1.00 mg/dL Final         Passed -  Patient is not pregnant      Passed - Valid encounter within last 12 months     Recent Outpatient Visits           1 week ago Uncontrolled type 2 diabetes mellitus with hyperglycemia, without long-term current use of insulin  [facility])   [place] [facility] Melvin Pao, NP   3 weeks ago Restless legs syndrome (RLS)   [place] [facility] Heeia, Megan P, DO   2 months ago Hypertension associated with type 2 diabetes mellitus [facility])   [place] [facility] Melvin Pao, NP   3 months ago Hypertension associated with type 2 diabetes mellitus [facility])   [place] [facility] Melvin Pao, NP   4 months ago Hospital discharge follow-up   [facility] Melvin Pao, NP

## 2024-04-09 ENCOUNTER — Ambulatory Visit (INDEPENDENT_AMBULATORY_CARE_PROVIDER_SITE_OTHER)

## 2024-04-09 ENCOUNTER — Ambulatory Visit: Admitting: Podiatry

## 2024-04-09 VITALS — BP 101/62 | HR 77 | Temp 97.7°F | Resp 16 | Ht 60.0 in | Wt 114.4 lb

## 2024-04-09 DIAGNOSIS — K5100 Ulcerative (chronic) pancolitis without complications: Secondary | ICD-10-CM

## 2024-04-09 MED ORDER — VEDOLIZUMAB 300 MG IV SOLR
300.0000 mg | Freq: Once | INTRAVENOUS | Status: AC
  Administered 2024-04-09: 300 mg via INTRAVENOUS
  Filled 2024-04-09: qty 5

## 2024-04-09 NOTE — Telephone Encounter (Signed)
 Spoke with patient and Informed her of opening tomorrow 09/12 @ 11:20 am. Patient accepted appointment and voiced understanding.

## 2024-04-09 NOTE — Progress Notes (Signed)
 Diagnosis: Pancolitis  Provider:  Praveen Mannam MD  Procedure: IV Infusion  IV Type: Peripheral, IV Location: L Antecubital  Entyvio  (Vedolizumab ), Dose: 300 mg  Infusion Start Time: 1328  Infusion Stop Time: 1400  Post Infusion IV Care: Peripheral IV Discontinued  Discharge: Condition: Good, Destination: Home . AVS Provided  Performed by:  Eleanor DELENA Bloch, RN

## 2024-04-10 ENCOUNTER — Other Ambulatory Visit

## 2024-04-10 ENCOUNTER — Telehealth: Payer: Self-pay | Admitting: Gastroenterology

## 2024-04-10 ENCOUNTER — Other Ambulatory Visit: Payer: Self-pay

## 2024-04-10 ENCOUNTER — Ambulatory Visit (INDEPENDENT_AMBULATORY_CARE_PROVIDER_SITE_OTHER): Admitting: Physician Assistant

## 2024-04-10 ENCOUNTER — Encounter: Payer: Self-pay | Admitting: Physician Assistant

## 2024-04-10 VITALS — BP 121/80 | HR 82 | Ht 60.0 in | Wt 108.0 lb

## 2024-04-10 DIAGNOSIS — R1032 Left lower quadrant pain: Secondary | ICD-10-CM

## 2024-04-10 DIAGNOSIS — N390 Urinary tract infection, site not specified: Secondary | ICD-10-CM | POA: Diagnosis not present

## 2024-04-10 DIAGNOSIS — R197 Diarrhea, unspecified: Secondary | ICD-10-CM

## 2024-04-10 LAB — URINALYSIS, COMPLETE
Bilirubin, UA: NEGATIVE
Glucose, UA: NEGATIVE
Ketones, UA: NEGATIVE
Nitrite, UA: NEGATIVE
Protein,UA: NEGATIVE
RBC, UA: NEGATIVE
Specific Gravity, UA: 1.01 (ref 1.005–1.030)
Urobilinogen, Ur: 0.2 mg/dL (ref 0.2–1.0)
pH, UA: 6 (ref 5.0–7.5)

## 2024-04-10 LAB — MICROSCOPIC EXAMINATION

## 2024-04-10 MED ORDER — AMOXICILLIN-POT CLAVULANATE 875-125 MG PO TABS: 1.0000 | ORAL_TABLET | Freq: Two times a day (BID) | ORAL | 0 refills | Status: DC

## 2024-04-10 NOTE — Telephone Encounter (Signed)
 Hard to say for sure what is going on. I don't believe she has had diverticulitis before. She has IBD, and with diarrhea, may be less likely diverticulitis. Not sure if she has been on antibiotics recently? Would like to send stool for C Diff, GI pathogen panel, and fecal calprotectin to further evaluate, if she can come to the lab. This will not be back until next week however. Heading into the weekend, I would monitor. If worsening pain, or fevers, could give her empiric antibiotics to take if she notes this (without knowing for sure however what is causing this). She has a cipro  allergy. Could give her a script for Augmentin  to take BID for 5 days if symptoms worsen throughout the weekend. If worsens despite that or severe pain she would need to go to the ED for imaging. Thanks

## 2024-04-10 NOTE — Telephone Encounter (Signed)
 Pt reports she has surgery for a kidney stone a while back. On Tuesday she started having LLQ pain and diarrhea. She has not had any fever but has had some chills. Urology saw her to day and she does not have another stone and her urine was clear. Urology told pt that thought she had diverticulitis and to cotact her GI doc. Dr. Leigh please advise.

## 2024-04-10 NOTE — Progress Notes (Signed)
 04/10/2024 11:21 AM   Amy Hansen [birth year] [id number]  CC: Chief Complaint  Patient presents with   Urinary Tract Infection   HPI: Amy Hansen is a 68 y.o. female with PMH MDR Klebsiella urinary colonization, GSM on estrogen cream, IC, nephrolithiasis who underwent left ureteroscopy with Dr. Francisca on 03/02/2024 for management of a 9 mm left renal stone, gross hematuria with benign workup in 2024, Crohn's disease, and diverticulitis who presents today for evaluation of possible UTI.   Today she reports 3 days of dysuria, left flank and left sided abdominal pain, subjective fever, chills, and watery diarrhea every time she eats.  She has not noticed any blood in her stool.  In-office UA today positive for trace leukocytes; urine microscopy pan negative.  PMH: Past Medical History:  Diagnosis Date   Acute on chronic heart failure with preserved ejection fraction (HFpEF) (HCC) 05/16/2018   AKI (acute kidney injury) (HCC) 02/05/2023   Anemia    Anxiety    Arthritis    Blood transfusion without reported diagnosis    Cataract    CHF (congestive heart failure) (HCC)    Chronic kidney disease    UTI, hematuria in urine   Colitis    Crohn's disease (HCC)    Depression    Diabetes (HCC)    Diverticulosis    Elevated liver enzymes    Frequent headaches    Grade I diastolic dysfunction    Interstitial cystitis    PONV (postoperative nausea and vomiting)    Primary biliary cirrhosis (HCC)    Recurrent UTI    Restless leg syndrome    TIA (transient ischemic attack) 02/20/2021   Urinary frequency     Surgical History: Past Surgical History:  Procedure Laterality Date   bariatric bypass  2012   BIOPSY  05/03/2020   Procedure: BIOPSY;  Surgeon: Teressa Toribio SQUIBB, MD;  [facility] ENDOSCOPY;  Service: Endoscopy;;   CARPAL TUNNEL RELEASE Right 2003   CARPAL TUNNEL RELEASE Right    2008   CHOLECYSTECTOMY  1975   COLONOSCOPY     COLONOSCOPY WITH PROPOFOL  N/A 05/03/2020    Procedure: COLONOSCOPY WITH PROPOFOL ;  Surgeon: Teressa Toribio SQUIBB, MD;  Location: [facility] ENDOSCOPY;  Service: Endoscopy;  Laterality: N/A;   CORONARY STENT INTERVENTION N/A 05/15/2023   Procedure: CORONARY STENT INTERVENTION;  Surgeon: Mady Bruckner, MD;  Location: [facility] INVASIVE CV LAB;  Service: Cardiovascular;  Laterality: N/A;   CYSTOSCOPY W/ RETROGRADES Bilateral 06/06/2015   Procedure: CYSTOSCOPY WITH RETROGRADE PYELOGRAM;  Surgeon: Donnice Brooks, MD;  Location: [facility] ORS;  Service: Urology;  Laterality: Bilateral;   CYSTOSCOPY/URETEROSCOPY/HOLMIUM LASER/STENT PLACEMENT Left 03/02/2024   Procedure: CYSTOSCOPY/URETEROSCOPY/HOLMIUM LASER/STENT PLACEMENT;  Surgeon: Francisca Redell BROCKS, MD;  Location: [facility] ORS;  Service: Urology;  Laterality: Left;   EYE SURGERY Left 07/06/2022   FL INJ LEFT KNEE CT ARTHROGRAM ([facility] HX) Left    1995   GASTRIC BYPASS  2010   HAND SURGERY Left 01/19/2021   Thumb   HEMORRHOID SURGERY  2013   KNEE ARTHROSCOPY Left 1996   LEFT HEART CATH AND CORONARY ANGIOGRAPHY Left 05/15/2023   Procedure: LEFT HEART CATH AND CORONARY ANGIOGRAPHY;  Surgeon: Mady Bruckner, MD;  Location: [facility] INVASIVE CV LAB;  Service: Cardiovascular;  Laterality: Left;   TONSILLECTOMY     TOTAL ABDOMINAL HYSTERECTOMY W/ BILATERAL SALPINGOOPHORECTOMY      Home Medications:  Allergies as of 04/10/2024       Reactions   Avelox [moxifloxacin Hcl In Nacl] Anaphylaxis  Bactrim  [sulfamethoxazole -trimethoprim ] Anaphylaxis   Ciprofloxacin  Other (See Comments)   Pt states she was told never to take this as it is in the same family as Avelox.    Bacitracin  Swelling   rash   Buspar  [buspirone ] Other (See Comments)   hallucinations   Neomycin -polymyxin-dexameth Other (See Comments)   Burning in the eyes   Neosporin Original [neomycin -bacitracin  Zn-polymyx] Swelling   Shellfish Allergy Hives, Swelling   Face and lips   Aquaphor [lanolin-petrolatum] Itching, Rash   Depakote [divalproex Sodium]  Other (See Comments)   Unknown Reaction   Imitrex  [sumatriptan ] Other (See Comments)   Neck and shoulder pain   Stadol [butorphanol] Rash        Medication List        Accurate as of April 10, 2024 11:21 AM. If you have any questions, ask your nurse or doctor.          albuterol  (2.5 MG/3ML) 0.083% nebulizer solution Commonly known as: PROVENTIL  Take 3 mLs (2.5 mg total) by nebulization every 6 (six) hours as needed for wheezing or shortness of breath.   albuterol  108 (90 Base) MCG/ACT inhaler Commonly known as: VENTOLIN  HFA Inhale 2 puffs into the lungs every 6 (six) hours as needed for wheezing or shortness of breath.   ALPHA LIPOIC ACID PO Take 1 capsule by mouth daily.   ALPRAZolam  0.5 MG tablet Commonly known as: XANAX  Take 1 tablet (0.5 mg total) by mouth 2 (two) times daily as needed for anxiety. Must last 15 days.   aspirin  EC 81 MG tablet Take 1 tablet (81 mg) by mouth once daily. Swallow whole.   cephALEXin  500 MG capsule Commonly known as: KEFLEX  Take 1 capsule (500 mg total) by mouth 3 (three) times daily.   Compressor/Nebulizer Misc 1 each by Does not apply route as directed.   Entyvio  300 MG injection Generic drug: vedolizumab  Inject 300 mg into the vein every 30 (thirty) days.   estradiol  0.1 MG/GM vaginal cream Commonly known as: ESTRACE  Apply one pea-sized amount around the opening of the urethra 3 times weekly.   furosemide  20 MG tablet Commonly known as: LASIX  Take 1 tablet (20 mg total) by mouth daily as needed (for swelling in the legs accompanied by a weight gain of 3 pounds overnight -- USE SPARINGLY).   isosorbide  mononitrate 30 MG 24 hr tablet Commonly known as: IMDUR  Take 0.5 tablets (15 mg total) by mouth daily.   metoprolol  tartrate 25 MG tablet Commonly known as: LOPRESSOR  Take 25 mg by mouth 2 (two) times daily.   nitroGLYCERIN  0.4 MG SL tablet Commonly known as: NITROSTAT  Place 1 tablet (0.4 mg total) under the  tongue every 5 (five) minutes as needed.   ondansetron  4 MG tablet Commonly known as: Zofran  Take 1 tablet (4 mg total) by mouth every 6 (six) hours.   ondansetron  8 MG disintegrating tablet Commonly known as: ZOFRAN -ODT Take 1 tablet (8 mg total) by mouth every 8 (eight) hours as needed for nausea or vomiting.   oxyCODONE  5 MG immediate release tablet Commonly known as: Roxicodone  Take 1-2 tablets (5-10 mg total) by mouth every 6 (six) hours as needed for severe pain (pain score 7-10).   pantoprazole  40 MG tablet Commonly known as: PROTONIX  Take 1 tablet (40 mg total) by mouth daily.   pramipexole  1 MG tablet Commonly known as: MIRAPEX  Take 1 tablet (1 mg total) by mouth at bedtime.   prednisoLONE acetate 1 % ophthalmic suspension Commonly known as: PRED FORTE Place 1  drop into both eyes 2 (two) times daily.   QUEtiapine  300 MG tablet Commonly known as: SEROQUEL  Take 1 tablet (300 mg total) by mouth at bedtime.   rOPINIRole  1 MG tablet Commonly known as: REQUIP  Take 1 tablet (1 mg total) by mouth 3 (three) times daily.   rosuvastatin  5 MG tablet Commonly known as: CRESTOR  TAKE 1 TABLET DAILY   sertraline  100 MG tablet Commonly known as: ZOLOFT  Take 1 tablet (100 mg total) by mouth daily.   tirzepatide  7.5 MG/0.5ML Pen Commonly known as: MOUNJARO  Inject 7.5 mg into the skin once a week.   triamcinolone  cream 0.1 % Commonly known as: KENALOG  Apply 1 Application topically 2 (two) times daily.   trimethoprim -polymyxin b ophthalmic solution Commonly known as: POLYTRIM SMARTSIG:In Eye(s)   ursodiol 300 MG capsule Commonly known as: ACTIGALL Take 300 mg by mouth 2 (two) times daily. Take 2 capsules in the AM and 1 capsule in the PM   VITAMIN B-12 PO Take 1 tablet by mouth daily.   Vitamin D  (Ergocalciferol ) 1.25 MG (50000 UNIT) Caps capsule Commonly known as: DRISDOL  Take 1 capsule (50,000 Units total) by mouth every 7 (seven) days.        Allergies:   Allergies  Allergen Reactions   Avelox [Moxifloxacin Hcl In Nacl] Anaphylaxis   Bactrim  [Sulfamethoxazole -Trimethoprim ] Anaphylaxis   Ciprofloxacin  Other (See Comments)    Pt states she was told never to take this as it is in the same family as Avelox.    Bacitracin  Swelling    rash   Buspar  [Buspirone ] Other (See Comments)    hallucinations   Neomycin -Polymyxin-Dexameth Other (See Comments)    Burning in the eyes   Neosporin Original [Neomycin -Bacitracin  Zn-Polymyx] Swelling   Shellfish Allergy Hives and Swelling    Face and lips   Aquaphor [Lanolin-Petrolatum] Itching and Rash   Depakote [Divalproex Sodium] Other (See Comments)    Unknown Reaction   Imitrex  [Sumatriptan ] Other (See Comments)    Neck and shoulder pain   Stadol [Butorphanol] Rash    Family History: Family History  Problem Relation Age of Onset   Colon cancer Mother    Stroke Father    Heart disease Father    Heart failure Sister    Diabetes Paternal Grandmother    Bladder Cancer Neg Hx    Kidney disease Neg Hx    Prostate cancer Neg Hx    Kidney cancer Neg Hx    Pancreatic cancer Neg Hx    Esophageal cancer Neg Hx    Stomach cancer Neg Hx    Rectal cancer Neg Hx    Breast cancer Neg Hx     Social History:   reports that she quit smoking about 48 years ago. Her smoking use included cigarettes. She has never used smokeless tobacco. She reports that she does not drink alcohol and does not use drugs.  Physical Exam: BP 121/80   Pulse 82   Ht 5' (1.524 m)   Wt 108 lb (49 kg)   LMP  (LMP Unknown)   BMI 21.09 kg/m   Constitutional:  Alert and oriented, no acute distress, nontoxic appearing HEENT: [place], AT Cardiovascular: No clubbing, cyanosis, or edema Respiratory: Normal respiratory effort, no increased work of breathing Skin: No rashes, bruises or suspicious lesions Neurologic: Grossly intact, no focal deficits, moving all 4 extremities Psychiatric: Normal mood and affect  Laboratory  Data: Results for orders placed or performed in visit on 04/10/24  CULTURE, URINE COMPREHENSIVE   Collection Time: 04/10/24  11:09 AM   Specimen: Urine   UR  Result Value Ref Range   Urine Culture, Comprehensive Final report (A)    Organism ID, Bacteria Proteus mirabilis (A)    ANTIMICROBIAL SUSCEPTIBILITY Comment   Microscopic Examination   Collection Time: 04/10/24 11:09 AM   Urine  Result Value Ref Range   WBC, UA 0-5 0 - 5 /hpf   RBC, Urine 0-2 0 - 2 /hpf   Epithelial Cells (non renal) 0-10 0 - 10 /hpf   Bacteria, UA Few None seen/Few  Urinalysis, Complete   Collection Time: 04/10/24 11:09 AM  Result Value Ref Range   Specific Gravity, UA 1.010 1.005 - 1.030   pH, UA 6.0 5.0 - 7.5   Color, UA Yellow Yellow   Appearance Ur Clear Clear   Leukocytes,UA Trace (A) Negative   Protein,UA Negative Negative/Trace   Glucose, UA Negative Negative   Ketones, UA Negative Negative   RBC, UA Negative Negative   Bilirubin, UA Negative Negative   Urobilinogen, Ur 0.2 0.2 - 1.0 mg/dL   Nitrite, UA Negative Negative   Microscopic Examination See below:    *Note: Due to a large number of results and/or encounters for the requested time period, some results have not been displayed. A complete set of results can be found in Results Review.   Assessment & Plan:   1. Recurrent UTI (Primary) Her UA is bland today, low suspicion for UTI.  I think her symptoms are more likely GI in origin and I encouraged her to follow-up with her gastroenterologist for further evaluation.  She agreed. - CULTURE, URINE COMPREHENSIVE - Urinalysis, Complete   Return if symptoms worsen or fail to improve.  Lucie Hones, PA-C  [facility] [place] [address] [place], [place] [address] [phone]

## 2024-04-10 NOTE — Telephone Encounter (Signed)
 Spoke with pt and she is aware of recommendations per Dr. Leigh. Lab orders in epic and prescription sent to pharmacy.

## 2024-04-10 NOTE — Telephone Encounter (Signed)
 Inbound call from patient stating that she seen her urologist today because she thought she was having reoccurring UTI. She states she has been having  left sided abdominal pain and watery diarrhea when she eats and she has not changed her diet. Patients urologist believes she may be having a diverticulitis flare. Patient is requesting a call back to discuss. Please advise.

## 2024-04-12 LAB — CLOSTRIDIUM DIFFICILE BY PCR: Toxigenic C. Difficile by PCR: NEGATIVE

## 2024-04-13 ENCOUNTER — Ambulatory Visit: Payer: Self-pay | Admitting: Gastroenterology

## 2024-04-14 LAB — CALPROTECTIN, FECAL: Calprotectin, Fecal: 123 ug/g — ABNORMAL HIGH (ref 0–120)

## 2024-04-15 DIAGNOSIS — K743 Primary biliary cirrhosis: Secondary | ICD-10-CM | POA: Diagnosis not present

## 2024-04-15 LAB — CULTURE, URINE COMPREHENSIVE

## 2024-04-17 ENCOUNTER — Telehealth: Payer: Self-pay | Admitting: Physician Assistant

## 2024-04-17 ENCOUNTER — Ambulatory Visit: Payer: Self-pay | Admitting: Physician Assistant

## 2024-04-17 NOTE — Telephone Encounter (Signed)
 LVM to [facility]
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
## 2024-04-20 NOTE — Telephone Encounter (Signed)
 Left second VM to [facility]
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
## 2024-04-21 ENCOUNTER — Ambulatory Visit: Admitting: Nurse Practitioner

## 2024-04-21 ENCOUNTER — Encounter: Payer: Self-pay | Admitting: Gastroenterology

## 2024-04-21 ENCOUNTER — Encounter: Payer: Self-pay | Admitting: Physician Assistant

## 2024-04-21 ENCOUNTER — Encounter: Payer: Self-pay | Admitting: Oncology

## 2024-04-21 NOTE — Progress Notes (Unsigned)
 Cardiology Office Note    Date:  04/22/2024   ID:  Amy Hansen, DOB 18-Apr-1956, MRN [id number]  PCP:  Melvin Pao, NP  Cardiologist:  Lonni Hanson, MD  Electrophysiologist:  None   Chief Complaint: Follow up  History of Present Illness:   Amy Hansen is a 68 y.o. 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   She was previously followed by Dr. Monette with echo in 07/2016 showing an EF of 55 to 60%, no regional wall motion abnormalities, grade 1 diastolic dysfunction, mild mitral regurgitation, and a mildly dilated left atrium.  Treadmill MPI in 08/2016 showed no significant ischemia with adequate exercise tolerance, and was overall low risk.  Zio patch in 07/2019 showed a predominant rhythm of sinus with an average rate of 101 bpm, a single episode of NSVT lasting 9 beats, 20 episodes of SVT lasting up to 11 beats, and rare PACs/PVCs.  Patient triggered events corresponded to sinus rhythm with PACs.  Echo from 03/2021 demonstrated an EF of 60 to 65%, no regional wall motion abnormalities, grade 1 diastolic dysfunction, normal RV systolic function and ventricular cavity size, mild aortic valve sclerosis without evidence of stenosis, and an estimated right atrial pressure of 3 mmHg.      Prior MRI of the brain in 2022 demonstrated small chronic cortical/subcortical infarct within the left parietal lobe as well as a small chronic lacunar infarcts within the bilateral basal ganglia and changes consistent with chronic small vessel ischemia.     She was seen in the office in 05/2022 and reported a 1 week history of exertional shortness of breath when going up or down a flight of stairs.  She also reported an episode during the summer where she woke up on the  floor after a lamp fell on her.  At that time, CT of the head and MRI of the face showed no acute process.  It was unclear if this was associated with a syncopal episode or not.  There was no cardiac pro or postdrome.  Zio patch in 05/2022 showed a predominant rhythm of sinus with an average rate of 103 bpm (range 74 to 150 bpm in sinus), single episode of NSVT occurred lasting 4 beats with a maximum rate of 222 bpm, 11 episodes of SVT lasting up to 7 beats with a maximum rate of 148 bpm, rare PACs and PVCs, and no sustained arrhythmias or prolonged pauses.  Patient triggered events corresponded to sinus rhythm and blocked PAC.   She was seen at outside ED on 07/16/2022 with intermittent confusion and difficulty saying specific words.  CT of the head showed no acute intracranial abnormality.  High-sensitivity troponin negative.  She followed up with PCP and was found to have UTI.  Subsequent MRI of the brain in 09/2022 showed no evidence of acute intracranial abnormality with redemonstrated tiny chronic cortical infarct within the left parietal lobe, redemonstrated tiny chronic lacunar infarct within the right thalamus, and similar appearance of likely chronic small vessel ischemia.   Echo on 07/24/2022 demonstrated an EF of 50 to 55%, no regional wall motion abnormalities, mild LVH, grade 1 diastolic dysfunction, normal RV systolic function and ventricular cavity size, mildly dilated left atrium, and trivial mitral regurgitation.   She was seen in the office in 07/2022 and  continued to report episodes of transient confusion associated with dysarthria without weakness.  CTA head and neck in 11/2022 showed no acute intracranial finding with chronic small vessel ischemic changes.  No intracranial large vessel occlusion or proximal stenosis.  No suspected significant carotid artery stenosis with aortic atherosclerosis noted.   She was admitted to the hospital in 01/2023 with flank pain found to have pan positive  review of systems.  She was treated for AKI presumed to be secondary to dehydration in the setting of dysphagia with nausea and vomiting.  Found to have large amount of stool burden on CT which was likely contributing to her nausea and vomiting.  GI was consulted with very low suspicion for underlying organic GI disorder with symptoms felt to be related to medication side effects and anxiety.     She was seen in the office in 02/2023 noting increase in lower extremity edema despite taking 40 mg of furosemide  for several weeks.  Her weight was down 22 pounds by our scale when compared to her visit in 07/2022.  She was advised to take furosemide  20 mg twice daily.  Echo on 04/10/2023 showed an EF of 60 to 65%, no regional wall motion abnormalities, grade 1 diastolic dysfunction, normal RV systolic function and ventricular cavity size, mild to moderate mitral regurgitation, moderate mitral annular calcification, mild to moderate tricuspid regurgitation, and an estimated right atrial pressure of 3 mmHg.   She was seen in the office on 04/11/2023 noting intermittent episodes of pain under the left breast that radiated to the left arm.  She was without symptoms of dyspnea and continue to note waxing and waning edema in her lower extremities.  Her weight has been relatively stable.  In the setting of palpitations noted off metoprolol , this was resumed.  Subsequent coronary CTA on 05/02/2023 showed a calcium  score of 29.3 which was the 68th percentile.  There was less than 25% LCx and RCA stenosis with severe stenosis estimated at greater than 70% of proximal OM1.  ctFFR of 0.77 in the ostial OM1 with normal ctFFR of the LAD and RCA.  LHC on 05/15/2023 showed severe single-vessel CAD with 90% stenosis of proximal OM1.  There was mild to moderate disease involving the LAD and RCA as outlined below.  Sluggish flow in the distal LAD on initial images resolved with intracoronary nitroglycerin  suggesting some element of coronary  vasospasm.  Normal LVEF estimated at 55 to 65% with upper normal filling pressure with an LVEDP of 15 mmHg.  She underwent successful PCI/DES to OM1.  Following cardiac cath that she was started on Imdur  15 mg daily in the setting of 2 episodes of chest discomfort that prompted her to take SL NTG.  Previously noted ankle swelling had improved from prior visits.   She was seen in our office on 06/19/2023 noting ongoing lower extremity swelling and weight gain of 5 pounds by our scale when compared to her visit earlier in the month.  She had recently been started on pregabalin  by neurology.  She was taking furosemide  20 mg twice daily.  She continued to note sporadic chest pain that would last only few seconds in duration that was felt to be unlikely related to coronary insufficiency.  She seemed to be tolerating Imdur  and was overall feeling better since undergoing PCI to OM1 in 04/2023.  She was volume overloaded with recommendation to increase furosemide  to 40 mg twice daily and replete potassium.  With discontinuation of pregabalin  the lower extremity swelling  improved.   She was admitted to the hospital in late 05/2023 with hyperosmolar nonketotic hyperglycemia with blood glucose reading greater than 600 and treated with insulin  drip.  She was seen in the ED on 07/01/2023 with continued hyperglycemia with a glucose of 433, treated with IV fluids and addition of short acting sliding scale insulin .  Management of her diabetes has been challenging with irregular eating habits and with prior self discontinuation of Mounjaro .   She was seen in the office in 08/2023 noting recurrence in lower extremity swelling following the reinitiation of pregabalin .  She had also increased her furosemide  to 40 mg twice daily in this setting.  It was recommended she discontinue pregabalin  with subsequent improvement in lower extremity swelling noted.  She reduced her furosemide  back down to 20 mg twice daily.    She was last  seen in the office in 09/2023 and was without symptoms of angina or cardiac decompensation.  She was consuming a diet high in sodium.  Clopidogrel  was discontinued in 11/2023 with recommendation to continue aspirin  81 mg.  She was admitted to the hospital in late 10/2023 and again in 11/2023 multifocal pneumonia.  High-sensitivity troponin negative during those admissions.  BNP 141.    She comes in today and is without symptoms of angina or cardiac decompensation.  She does note some intermittent dizziness and fatigue.  Blood pressure is soft at 88/60.  She has not been checking her blood pressure at home (does have a BP cuff).  She has not been checking her blood sugars at home.  No significant lower extremity swelling or progressive orthopnea.  Her weight is down 13 pounds today when compared to her last clinic visit in 09/2023.  She remains on GLP-1 therapy with diminished appetite.  She has been taking furosemide  20 mg every day rather than as needed since her last visit.  There has been some associated near syncope without frank syncope.  Remains active at baseline.   Labs independently reviewed: 03/2024 - BUN 6, serum creatinine 0.61, potassium 4.0, albumin 3.8, AST/ALT normal, A1c 7.8 02/2024 - TSH normal, magnesium  2.0, Hgb 10.4, PLT 395 11/2023 - TC 120, TG 150, HDL 50, LDL 45  Past Medical History:  Diagnosis Date   Acute on chronic heart failure with preserved ejection fraction (HFpEF) (HCC) 05/16/2018   AKI (acute kidney injury) 02/05/2023   Anemia    Anxiety    Arthritis    Blood transfusion without reported diagnosis    Cataract    CHF (congestive heart failure) (HCC)    Chronic kidney disease    UTI, hematuria in urine   Colitis    Crohn's disease (HCC)    Depression    Diabetes (HCC)    Diverticulosis    Elevated liver enzymes    Frequent headaches    Grade I diastolic dysfunction    Interstitial cystitis    PONV (postoperative nausea and vomiting)    Primary biliary cirrhosis  (HCC)    Recurrent UTI    Restless leg syndrome    TIA (transient ischemic attack) 02/20/2021   Urinary frequency     Past Surgical History:  Procedure Laterality Date   bariatric bypass  2012   BIOPSY  05/03/2020   Procedure: BIOPSY;  Surgeon: Teressa Toribio SQUIBB, MD;  Location: [facility] ENDOSCOPY;  Service: Endoscopy;;   CARPAL TUNNEL RELEASE Right 2003   CARPAL TUNNEL RELEASE Right    2008   CHOLECYSTECTOMY  1975   COLONOSCOPY  COLONOSCOPY WITH PROPOFOL  N/A 05/03/2020   Procedure: COLONOSCOPY WITH PROPOFOL ;  Surgeon: Teressa Toribio SQUIBB, MD;  Location: [facility] ENDOSCOPY;  Service: Endoscopy;  Laterality: N/A;   CORONARY STENT INTERVENTION N/A 05/15/2023   Procedure: CORONARY STENT INTERVENTION;  Surgeon: Mady Bruckner, MD;  Location: [facility] INVASIVE CV LAB;  Service: Cardiovascular;  Laterality: N/A;   CYSTOSCOPY W/ RETROGRADES Bilateral 06/06/2015   Procedure: CYSTOSCOPY WITH RETROGRADE PYELOGRAM;  Surgeon: Donnice Brooks, MD;  Location: [facility] ORS;  Service: Urology;  Laterality: Bilateral;   CYSTOSCOPY/URETEROSCOPY/HOLMIUM LASER/STENT PLACEMENT Left 03/02/2024   Procedure: CYSTOSCOPY/URETEROSCOPY/HOLMIUM LASER/STENT PLACEMENT;  Surgeon: Francisca Redell BROCKS, MD;  Location: [facility] ORS;  Service: Urology;  Laterality: Left;   EYE SURGERY Left 07/06/2022   FL INJ LEFT KNEE CT ARTHROGRAM ([facility] HX) Left    1995   GASTRIC BYPASS  2010   HAND SURGERY Left 01/19/2021   Thumb   HEMORRHOID SURGERY  2013   KNEE ARTHROSCOPY Left 1996   LEFT HEART CATH AND CORONARY ANGIOGRAPHY Left 05/15/2023   Procedure: LEFT HEART CATH AND CORONARY ANGIOGRAPHY;  Surgeon: Mady Bruckner, MD;  Location: [facility] INVASIVE CV LAB;  Service: Cardiovascular;  Laterality: Left;   TONSILLECTOMY     TOTAL ABDOMINAL HYSTERECTOMY W/ BILATERAL SALPINGOOPHORECTOMY      Current Medications: Current Meds  Medication Sig   albuterol  (PROVENTIL ) (2.5 MG/3ML) 0.083% nebulizer solution Take 3 mLs (2.5 mg total) by nebulization every 6  (six) hours as needed for wheezing or shortness of breath.   albuterol  (VENTOLIN  HFA) 108 (90 Base) MCG/ACT inhaler Inhale 2 puffs into the lungs every 6 (six) hours as needed for wheezing or shortness of breath.   ALPHA LIPOIC ACID PO Take 1 capsule by mouth daily.   ALPRAZolam  (XANAX ) 0.5 MG tablet Take 1 tablet (0.5 mg total) by mouth 2 (two) times daily as needed for anxiety. Must last 15 days.   amoxicillin -clavulanate (AUGMENTIN ) 875-125 MG tablet Take 1 tablet by mouth 2 (two) times daily.   aspirin  EC 81 MG tablet Take 1 tablet (81 mg) by mouth once daily. Swallow whole.   Cyanocobalamin  (VITAMIN B-12 PO) Take 1 tablet by mouth daily.   estradiol  (ESTRACE ) 0.1 MG/GM vaginal cream Apply one pea-sized amount around the opening of the urethra 3 times weekly.   furosemide  (LASIX ) 20 MG tablet Take 1 tablet (20 mg total) by mouth daily as needed (for swelling in the legs accompanied by a weight gain of 3 pounds overnight -- USE SPARINGLY).   isosorbide  mononitrate (IMDUR ) 30 MG 24 hr tablet Take 0.5 tablets (15 mg total) by mouth daily.   metoprolol  tartrate (LOPRESSOR ) 25 MG tablet Take 25 mg by mouth 2 (two) times daily.   Nebulizers (COMPRESSOR/NEBULIZER) MISC 1 each by Does not apply route as directed.   nitroGLYCERIN  (NITROSTAT ) 0.4 MG SL tablet Place 1 tablet (0.4 mg total) under the tongue every 5 (five) minutes as needed.   ondansetron  (ZOFRAN ) 4 MG tablet Take 1 tablet (4 mg total) by mouth every 6 (six) hours.   ondansetron  (ZOFRAN -ODT) 8 MG disintegrating tablet Take 1 tablet (8 mg total) by mouth every 8 (eight) hours as needed for nausea or vomiting.   oxyCODONE  (ROXICODONE ) 5 MG immediate release tablet Take 1-2 tablets (5-10 mg total) by mouth every 6 (six) hours as needed for severe pain (pain score 7-10).   pantoprazole  (PROTONIX ) 40 MG tablet Take 1 tablet (40 mg total) by mouth daily.   pramipexole  (MIRAPEX ) 1 MG tablet Take 1 tablet (1 mg total) by  mouth at bedtime.    prednisoLONE acetate (PRED FORTE) 1 % ophthalmic suspension Place 1 drop into both eyes 2 (two) times daily.   QUEtiapine  (SEROQUEL ) 300 MG tablet Take 1 tablet (300 mg total) by mouth at bedtime.   rOPINIRole  (REQUIP ) 1 MG tablet Take 1 tablet (1 mg total) by mouth 3 (three) times daily.   rosuvastatin  (CRESTOR ) 5 MG tablet TAKE 1 TABLET DAILY   sertraline  (ZOLOFT ) 100 MG tablet Take 1 tablet (100 mg total) by mouth daily.   tirzepatide  (MOUNJARO ) 7.5 MG/0.5ML Pen Inject 7.5 mg into the skin once a week.   triamcinolone  cream (KENALOG ) 0.1 % Apply 1 Application topically 2 (two) times daily.   trimethoprim -polymyxin b (POLYTRIM) ophthalmic solution SMARTSIG:In Eye(s)   ursodiol (ACTIGALL) 300 MG capsule Take 300 mg by mouth 2 (two) times daily. Take 2 capsules in the AM and 1 capsule in the PM   vedolizumab  (ENTYVIO ) 300 MG injection Inject 300 mg into the vein every 30 (thirty) days.   Vitamin D , Ergocalciferol , (DRISDOL ) 1.25 MG (50000 UNIT) CAPS capsule Take 1 capsule (50,000 Units total) by mouth every 7 (seven) days.    Allergies:   Avelox [moxifloxacin hcl in nacl], Bactrim  [sulfamethoxazole -trimethoprim ], Ciprofloxacin , Bacitracin , Buspar  [buspirone ], Neomycin -polymyxin-dexameth, Neosporin original [neomycin -bacitracin  zn-polymyx], Shellfish allergy, Aquaphor [lanolin-petrolatum], Depakote [divalproex sodium], Imitrex  [sumatriptan ], and Stadol [butorphanol]   Social History   Socioeconomic History   Marital status: Married    Spouse name: Not on file   Number of children: Not on file   Years of education: Not on file   Highest education level: Some college, no degree  Occupational History   Not on file  Tobacco Use   Smoking status: Former    Current packs/day: 0.00    Types: Cigarettes    Quit date: 04/25/1975    Years since quitting: 49.0   Smokeless tobacco: Never   Tobacco comments:    quit 48 years ago  Vaping Use   Vaping status: Never Used  Substance and Sexual  Activity   Alcohol use: No    Alcohol/week: 0.0 standard drinks of alcohol   Drug use: No   Sexual activity: Not Currently    Birth control/protection: Post-menopausal, Surgical  Other Topics Concern   Not on file  Social History Narrative   Caffeine  5 servings per day.   Social Drivers of Health   Financial Resource Strain: Medium Risk (07/09/2023)   Overall Financial Resource Strain (CARDIA)    Difficulty of Paying Living Expenses: Somewhat hard  Food Insecurity: No Food Insecurity (11/27/2023)   Hunger Vital Sign    Worried About Running Out of Food in the Last Year: Never true    Ran Out of Food in the Last Year: Never true  Transportation Needs: No Transportation Needs (11/27/2023)   PRAPARE - Administrator, Civil Service (Medical): No    Lack of Transportation (Non-Medical): No  Physical Activity: Insufficiently Active (07/09/2023)   Exercise Vital Sign    Days of Exercise per Week: 3 days    Minutes of Exercise per Session: 20 min  Stress: Stress Concern Present (07/09/2023)   [employer] of Occupational Health - Occupational Stress Questionnaire    Feeling of Stress : Very much  Social Connections: Socially Integrated (11/27/2023)   Social Connection and Isolation Panel    Frequency of Communication with Friends and Family: More than three times a week    Frequency of Social Gatherings with Friends and Family: Twice a week  Attends Religious Services: More than 4 times per year    Active Member of Clubs or Organizations: Yes    Attends Banker Meetings: More than 4 times per year    Marital Status: Married     Family History:  The patient's family history includes Colon cancer in her mother; Diabetes in her paternal grandmother; Heart disease in her father; Heart failure in her sister; Stroke in her father. There is no history of Bladder Cancer, Kidney disease, Prostate cancer, Kidney cancer, Pancreatic cancer, Esophageal cancer, Stomach  cancer, Rectal cancer, or Breast cancer.  ROS:   12-point review of systems is negative unless otherwise noted in the HPI.   EKGs/Labs/Other Studies Reviewed:    Studies reviewed were summarized above. The additional studies were reviewed today:  LHC 05/15/2023: Conclusions: Severe single-vessel coronary artery disease with 90% stenosis of proximal OM1.  There is mild-moderate disease involving the LAD and RCA.  Sluggish flow in the distal LAD on initial images resolved with intracoronary nitroglycerin  suggesting some element of coronary vasospasm. Normal left ventricular systolic function (LVEF 55-65%) with upper normal filling pressure (LVEDP 15 mmHg). Successful PCI to OM1 using Onyx Frontier 2.25 x 18 mm drug-eluting stent (postdilated to 2.6 mm) with 0% residual stenosis and TIMI-3 flow. Small right radial artery necessitating use of 1F sheath/catheters.  Consider alternative access for future catheterizations, particularly if larger catheters are necessary.   Recommendations: Overnight observation. Dual antiplatelet therapy with aspirin  and clopidogrel  for at least 6 months. Aggressive secondary prevention of coronary artery disease.  Will continue low-dose rosuvastatin  given very low LDL and concern for myopathy with higher intensity statin therapy. __________   Coronary 05/02/2023: FINDINGS: Aorta: Normal size. Mild aortic wall calcifications. No dissection.   Aortic Valve:  Trileaflet.  No calcifications.   Coronary Arteries:  Normal coronary origin.  Right dominance.   RCA is a dominant artery. There is calcified plaque proximally causing minimal stenosis (<25%).   Left main gives rise to LAD and LCX arteries. LM has no disease.   LAD has no plaque.   LCX is a non-dominant artery. Minimal proximal LCx stenosis (<25%). There is non calcified plaque in the proximal OM1 causing severe stenosis (>70%).   Other findings:   Normal pulmonary vein drainage into the left  atrium.   Normal left atrial appendage without a thrombus.   Normal size of the pulmonary artery.   IMPRESSION: 1. Coronary calcium  score of 29.3. This was 68th percentile for age and sex matched control. 2. Normal coronary origin with right dominance. 3. Severe stenosis of proximal OM1 branch (>70%). 4. Minimal RCA and LCx stenosis (<25%) 5. CAD-RADS 4 Severe stenosis. (70-99% or > 50% left main). Cardiac catheterization is recommended. Consider symptom-guided anti-ischemic pharmacotherapy as well as risk factor modification per guideline directed care. 6. Additional analysis with CT FFR will be submitted and reported separately.     crFFR: 1. Left Main:  No significant stenosis.   2. LAD: No significant stenosis.  FFRct 0.95 3. LCX: significant stenosis in ostial OM1. FFRct 0.77 4. RCA: No significant stenosis.  FFRct 0.92   IMPRESSION: 1. CT FFR analysis showed significant stenosis in the proximal first obtuse marginal branch (OM1). FFRct 0.77 2.  Cardiac catheterization recommended. __________   2D echo 04/10/2023: 1. Left ventricular ejection fraction, by estimation, is 60 to 65%. The  left ventricle has normal function. The left ventricle has no regional  wall motion abnormalities. Left ventricular diastolic parameters are  consistent with  Grade I diastolic  dysfunction (impaired relaxation).   2. Right ventricular systolic function is normal. The right ventricular  size is normal. Tricuspid regurgitation signal is inadequate for assessing  PA pressure.   3. The mitral valve is normal in structure. Mild to moderate mitral valve  regurgitation. No evidence of mitral stenosis. Moderate mitral annular  calcification.   4. Tricuspid valve regurgitation is mild to moderate.   5. The aortic valve is tricuspid. Aortic valve regurgitation is not  visualized. No aortic stenosis is present.   6. The inferior vena cava is normal in size with greater than 50%  respiratory  variability, suggesting right atrial pressure of 3 mmHg.  __________   2D echo 07/24/2022: 1. Left ventricular ejection fraction, by estimation, is 50 to 55%. Left  ventricular ejection fraction by 2D MOD biplane is 53.8 %. The left  ventricle has low normal function. The left ventricle has no regional wall  motion abnormalities. There is mild left ventricular hypertrophy. Left ventricular diastolic parameters are consistent with Grade I diastolic dysfunction (impaired relaxation). The average left ventricular global longitudinal strain is -16.8 %.   2. Right ventricular systolic function is normal. The right ventricular size is normal.   3. Left atrial size was mildly dilated.   4. The mitral valve is normal in structure. Trivial mitral valve regurgitation.   5. The aortic valve is tricuspid. Aortic valve regurgitation is not visualized. __________    Zio patch 05/2022:   The patient was monitored for 13 days, 23 hours.   The predominant rhythm was sinus with an average rate of 103 bpm (range 74-150 bpm in sinus).   There were rare PAC's and PVC's.   A single episode of nonsustained ventricular tachycardia occurred, lasting 4 beats with a maximum rate of 222 bpm.   There were 11 supraventricular runs, lasting up to 7 beats with a maximum rate of 148 bpm.   No sustained arrhythmia or prolonged pause was observed.   Patient triggered events correspond to sinus rhythm and blocked PAC.   Predominantly sinus rhythm with elevated average heart rate.  Rare PAC's and PVC's were observed, as well as a few brief episodes of NSVT and PSVT, as detailed above. __________   2D echo 04/20/2021: 1. Left ventricular ejection fraction, by estimation, is 60 to 65%. Left  ventricular ejection fraction by 2D MOD biplane is 64.3 %. The left  ventricle has normal function. The left ventricle has no regional wall  motion abnormalities. Left ventricular  diastolic parameters are consistent with Grade I  diastolic dysfunction  (impaired relaxation).   2. Right ventricular systolic function is normal. The right ventricular  size is normal.   3. The mitral valve is grossly normal. No evidence of mitral valve  regurgitation.   4. The aortic valve is tricuspid. Aortic valve regurgitation is not  visualized. Mild aortic valve sclerosis is present, with no evidence of  aortic valve stenosis.   5. The inferior vena cava is normal in size with greater than 50%  respiratory variability, suggesting right atrial pressure of 3 mmHg.   Comparison(s): LVEF 55-60%.  __________   Zio patch 07/2019: The patient was monitored for 12 days, 14 hours. The predominant rhythm was sinus with an average rate of 101 bpm (range 66 to 150 bpm in sinus). Rare PACs and PVCs were noted. A single episode of nonsustained ventricular tachycardia lasting nine beats occurred, with a maximum rate of 152 bpm. There were 20 episodes of supraventricular  tachycardia lasting up to 11 beats with a maximal rate of 176 bpm. No sustained arrhythmia or prolonged pause was observed. Patient triggered events correspond to sinus rhythm with PACs.   Predominantly sinus rhythm with rare PACs and PVCs as well as brief PSVT and NSVT.  Patient triggered events correspond to sinus rhythm with PACs. __________   Treadmill MPI 08/30/2016: Exercise  myocardial perfusion imaging study with no significant  ischemia Normal wall motion, EF estimated at 50% No EKG changes concerning for ischemia at peak stress or in recovery. Target heart rate achieved Adequate exercise tolerance, exercised for 5:50 min Low risk scan __________   2D echo 08/23/2016: - Left ventricle: The cavity size was normal. Wall thickness was    normal. Systolic function was normal. The estimated ejection    fraction was in the range of 55% to 60%. Wall motion was normal;    there were no regional wall motion abnormalities. Doppler    parameters are consistent with  abnormal left ventricular    relaxation (grade 1 diastolic dysfunction).  - Mitral valve: There was mild regurgitation.  - Left atrium: The atrium was mildly dilated.    EKG:  EKG is ordered today.  The EKG ordered today demonstrates NSR, 73 bpm, no acute ST-T changes  Recent Labs: 11/29/2023: B Natriuretic Peptide 141.2 03/18/2024: Hemoglobin 10.4; Magnesium  2.0; Platelets 395; TSH 0.640 03/31/2024: ALT 19; BUN 8; Creatinine, Ser 0.72; Potassium 4.0; Sodium 137  Recent Lipid Panel    Component Value Date/Time   CHOL 120 12/11/2023 1029   CHOL 139 07/17/2016 1408   CHOL 148 01/10/2012 0717   TRIG 150 (H) 12/11/2023 1029   TRIG 99 07/17/2016 1408   TRIG 110 01/10/2012 0717   HDL 50 12/11/2023 1029   HDL 47 01/10/2012 0717   CHOLHDL 2.4 12/11/2023 1029   CHOLHDL 2.0 04/26/2020 0339   VLDL 13 04/26/2020 0339   VLDL 20 07/17/2016 1408   VLDL 22 01/10/2012 0717   LDLCALC 45 12/11/2023 1029   LDLCALC 79 01/10/2012 0717    PHYSICAL EXAM:    VS:  BP (!) 88/60 (BP Location: Left Arm, Patient Position: Sitting, Cuff Size: Small)   Pulse 73   Ht 5' (1.524 m)   Wt 109 lb (49.4 kg)   LMP  (LMP Unknown)   SpO2 99%   BMI 21.29 kg/m   BMI: Body mass index is 21.29 kg/m.  Physical Exam Vitals reviewed.  Constitutional:      Appearance: She is well-developed.  HENT:     Head: Normocephalic and atraumatic.  Eyes:     General:        Right eye: No discharge.        Left eye: No discharge.  Cardiovascular:     Rate and Rhythm: Normal rate and regular rhythm.     Heart sounds: Normal heart sounds, S1 normal and S2 normal. Heart sounds not distant. No midsystolic click and no opening snap. No murmur heard.    No friction rub.  Pulmonary:     Effort: Pulmonary effort is normal. No respiratory distress.     Breath sounds: Normal breath sounds. No decreased breath sounds, wheezing, rhonchi or rales.  Musculoskeletal:     Cervical back: Normal range of motion.     Right lower leg: No  edema.     Left lower leg: No edema.  Skin:    General: Skin is warm and dry.     Nails: There is no clubbing.  Neurological:     Mental Status: She is alert and oriented to person, place, and time.  Psychiatric:        Speech: Speech normal.        Behavior: Behavior normal.        Thought Content: Thought content normal.        Judgment: Judgment normal.     Wt Readings from Last 3 Encounters:  04/22/24 109 lb (49.4 kg)  04/10/24 108 lb (49 kg)  04/09/24 114 lb 6.4 oz (51.9 kg)     ASSESSMENT & PLAN:   Hypotension: Likely in the setting of ongoing weight loss associate with GLP-1 therapy and dehydration, drinking 6 diet Dr. Buster daily without any other fluid intake, and in the setting of daily furosemide  usage.  Discontinue Lopressor .  Check CBC and BMP.  Recommend she stop daily furosemide .  CAD involving the native coronary arteries without angina/HLD: No symptoms suggestive of angina or cardiac decompensation.  Continue aggressive risk factor modification and secondary prevention including aspirin  81 mg and rosuvastatin  5 mg.  May need to discontinue Imdur  moving forward if blood pressure remains soft.  HFpEF: Euvolemic and well compensated, possibly volume depleted.  She has been taking furosemide  20 mg daily since her last visit with minimal oral intake.  Check BMP.  Further recommendations regarding diuretic pending labs.  Defer addition of MRA or SGLT2 inhibitor at this time given concern for off target effect in the context of multiple medication intolerances as well as with hypotension.  PSVT: Quiescent.  Discontinue beta-blocker given hypotension as above.  History of CVA: No new deficits.  Remains on aspirin  and statin as above.     Disposition: F/u with Dr. Mady or an APP in 1 month.   Medication Adjustments/Labs and Tests Ordered: Current medicines are reviewed at length with the patient today.  Concerns regarding medicines are outlined above. Medication  changes, Labs and Tests ordered today are summarized above and listed in the Patient Instructions accessible in Encounters.   Signed, Bernardino Bring, PA-C 04/22/2024 2:33 PM     [facility] [place] [address] [place], [place] [address] [phone]

## 2024-04-21 NOTE — Telephone Encounter (Signed)
 Sent MyChart message with recommendation

## 2024-04-21 NOTE — Telephone Encounter (Signed)
 Increase the Pramipexole , take 1/2 in the morning and continue 1 at night. In 1 week, take 1 po bid.

## 2024-04-22 ENCOUNTER — Other Ambulatory Visit

## 2024-04-22 ENCOUNTER — Ambulatory Visit: Attending: Physician Assistant | Admitting: Physician Assistant

## 2024-04-22 ENCOUNTER — Encounter: Payer: Self-pay | Admitting: Physician Assistant

## 2024-04-22 VITALS — BP 88/60 | HR 73 | Ht 60.0 in | Wt 109.0 lb

## 2024-04-22 DIAGNOSIS — N390 Urinary tract infection, site not specified: Secondary | ICD-10-CM | POA: Diagnosis not present

## 2024-04-22 DIAGNOSIS — I5032 Chronic diastolic (congestive) heart failure: Secondary | ICD-10-CM | POA: Diagnosis not present

## 2024-04-22 DIAGNOSIS — Z79899 Other long term (current) drug therapy: Secondary | ICD-10-CM | POA: Diagnosis not present

## 2024-04-22 DIAGNOSIS — I25810 Atherosclerosis of coronary artery bypass graft(s) without angina pectoris: Secondary | ICD-10-CM | POA: Diagnosis not present

## 2024-04-22 DIAGNOSIS — Z8673 Personal history of transient ischemic attack (TIA), and cerebral infarction without residual deficits: Secondary | ICD-10-CM

## 2024-04-22 DIAGNOSIS — I471 Supraventricular tachycardia, unspecified: Secondary | ICD-10-CM

## 2024-04-22 DIAGNOSIS — I959 Hypotension, unspecified: Secondary | ICD-10-CM

## 2024-04-22 DIAGNOSIS — E785 Hyperlipidemia, unspecified: Secondary | ICD-10-CM

## 2024-04-22 LAB — MICROSCOPIC EXAMINATION

## 2024-04-22 NOTE — Patient Instructions (Signed)
 Medication Instructions:  Your physician recommends the following medication changes.  STOP TAKING: Lopressor   *If you need a refill on your cardiac medications before your next appointment, please call your pharmacy*  Lab Work: Your provider would like for you to have following labs drawn today BMP and CBC.   If you have labs (blood work) drawn today and your tests are completely normal, you will receive your results only by: MyChart Message (if you have MyChart) OR A paper copy in the mail If you have any lab test that is abnormal or we need to change your treatment, we will call you to review the results.  Follow-Up: At [facility], you and your health needs are our priority.  As part of our continuing mission to provide you with exceptional heart care, our providers are all part of one team.  This team includes your primary Cardiologist (physician) and Advanced Practice Providers or APPs (Physician Assistants and Nurse Practitioners) who all work together to provide you with the care you need, when you need it.  Your next appointment:   2 month(s)  Provider:   You may see Lonni Hanson, MD or Bernardino Bring, PA-C

## 2024-04-23 ENCOUNTER — Ambulatory Visit: Payer: Self-pay | Admitting: Physician Assistant

## 2024-04-23 LAB — BASIC METABOLIC PANEL WITH GFR
BUN/Creatinine Ratio: 19 (ref 12–28)
BUN: 14 mg/dL (ref 8–27)
CO2: 25 mmol/L (ref 20–29)
Calcium: 9 mg/dL (ref 8.7–10.3)
Chloride: 101 mmol/L (ref 96–106)
Creatinine, Ser: 0.74 mg/dL (ref 0.57–1.00)
Glucose: 118 mg/dL — ABNORMAL HIGH (ref 70–99)
Potassium: 4.3 mmol/L (ref 3.5–5.2)
Sodium: 138 mmol/L (ref 134–144)
eGFR: 89 mL/min/1.73 (ref >59)

## 2024-04-23 LAB — MICROSCOPIC EXAMINATION

## 2024-04-23 LAB — CBC
Hematocrit: 39.6 % (ref 34.0–46.6)
Hemoglobin: 12.4 g/dL (ref 11.1–15.9)
MCH: 28.8 pg (ref 26.6–33.0)
MCHC: 31.3 g/dL — ABNORMAL LOW (ref 31.5–35.7)
MCV: 92 fL (ref 79–97)
Platelets: 266 x10E3/uL (ref 150–450)
RBC: 4.31 x10E6/uL (ref 3.77–5.28)
RDW: 15.8 % — ABNORMAL HIGH (ref 11.7–15.4)
WBC: 4.9 x10E3/uL (ref 3.4–10.8)

## 2024-04-23 LAB — URINALYSIS, COMPLETE
Bilirubin, UA: NEGATIVE
Glucose, UA: NEGATIVE
Ketones, UA: NEGATIVE
Nitrite, UA: NEGATIVE
Protein,UA: NEGATIVE
RBC, UA: NEGATIVE
Specific Gravity, UA: 1.01 (ref 1.005–1.030)
Urobilinogen, Ur: 0.2 mg/dL (ref 0.2–1.0)
pH, UA: 6 (ref 5.0–7.5)

## 2024-04-24 ENCOUNTER — Telehealth: Payer: Self-pay | Admitting: Gastroenterology

## 2024-04-24 ENCOUNTER — Other Ambulatory Visit: Payer: Self-pay | Admitting: Physician Assistant

## 2024-04-24 DIAGNOSIS — N390 Urinary tract infection, site not specified: Secondary | ICD-10-CM

## 2024-04-24 MED ORDER — CEPHALEXIN 500 MG PO CAPS: 500.0000 mg | ORAL_CAPSULE | Freq: Two times a day (BID) | ORAL | 0 refills | Status: AC

## 2024-04-24 NOTE — Telephone Encounter (Signed)
 Left message to call back.

## 2024-04-24 NOTE — Telephone Encounter (Signed)
 Thanks Dottie for the update, I agree with your recommendations.

## 2024-04-24 NOTE — Telephone Encounter (Signed)
 Patient calls stating that she just saw her Mychart message from last week regarding her fecal calprotectin and c diff results. States that she has been dealing with a myriad of health difficulties recently. States she had a kidney stone which had to be removed. Continued having left sided abdominal pain so went back to urology and was told that they thought she had diverticulitis flare so she called our office and rx was sent for antibiotics. States she took the antibotic in its entirety but continues with symptoms. Urology told her that her urine was okay so she thought maybe symptoms were GI in nature. States urology called her at lunchtime today to tell her that she does, in fact, have urinary tract infection and they are sending her an antibiotic.  Patient complains of 3 weeks left sided abdominal pain from the waist down which can be sharp, intense and is intermittent. +tenderness. Has had alternating constipation/diarrhea, though more loose stool while on augmentin . No hematochezia or melena. Denies any fever though does endorse some chills. Also c/o nausea and vomiting intermittently x 3 weeks. +dysuria  I advised patient that with constellation of symptoms, it does sound like her symptoms are more genitourinary in origin. Advised she pick up and begin antibiotic sent for UTI as soon as possible. Increase water intake to at least 64 oz daily. Follow a bland diet for now as not to cause additional GI upset while on antibiotic and having nausea/vomiting and to let us  know if her symptoms do not improve on the current antibiotic regimen. She verbalizes understanding.

## 2024-04-24 NOTE — Progress Notes (Signed)
 UA drop-off this week weakly positive but she is having ongoing UTI symptoms.  Urine culture is pending, but growing GNR's.  I am sending in empiric Keflex  to a pharmacy in [place] where she will be traveling this weekend.

## 2024-04-24 NOTE — Progress Notes (Signed)
 Amy Hansen                                          MRN: [id number]   04/24/2024   The [facility]BCI Quality Team Specialist reviewed this patient medical record for the purposes of chart review for care gap closure. The following were reviewed: abstraction for care gap closure-glycemic status assessment.    VBCI Quality Team

## 2024-04-24 NOTE — Telephone Encounter (Signed)
 Inbound call from patient requesting to speak with Nurse Alwyn. Patient is requesting a call back. please advise.

## 2024-04-24 NOTE — Telephone Encounter (Signed)
 PT is calling to update on her condition. She has not seen any improvement and she still feels horrible. Requesting to further discuss her symptoms. Please advise.

## 2024-04-25 LAB — CULTURE, URINE COMPREHENSIVE

## 2024-04-30 ENCOUNTER — Ambulatory Visit: Admitting: Physician Assistant

## 2024-04-30 ENCOUNTER — Encounter: Payer: Self-pay | Admitting: Physician Assistant

## 2024-04-30 DIAGNOSIS — F3162 Bipolar disorder, current episode mixed, moderate: Secondary | ICD-10-CM

## 2024-04-30 DIAGNOSIS — G2581 Restless legs syndrome: Secondary | ICD-10-CM | POA: Diagnosis not present

## 2024-04-30 DIAGNOSIS — F411 Generalized anxiety disorder: Secondary | ICD-10-CM

## 2024-04-30 DIAGNOSIS — R454 Irritability and anger: Secondary | ICD-10-CM

## 2024-04-30 MED ORDER — ALPRAZOLAM 0.5 MG PO TABS: 0.5000 mg | ORAL_TABLET | Freq: Two times a day (BID) | ORAL | Status: DC | PRN

## 2024-04-30 MED ORDER — SERTRALINE HCL 100 MG PO TABS: 150.0000 mg | ORAL_TABLET | Freq: Every day | ORAL | Status: AC
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
## 2024-05-07 ENCOUNTER — Ambulatory Visit

## 2024-05-07 VITALS — BP 139/85 | HR 84 | Temp 97.7°F | Resp 16 | Ht 60.0 in | Wt 119.2 lb

## 2024-05-07 DIAGNOSIS — K743 Primary biliary cirrhosis: Secondary | ICD-10-CM | POA: Diagnosis not present

## 2024-05-07 DIAGNOSIS — K529 Noninfective gastroenteritis and colitis, unspecified: Secondary | ICD-10-CM | POA: Diagnosis not present

## 2024-05-07 DIAGNOSIS — R748 Abnormal levels of other serum enzymes: Secondary | ICD-10-CM | POA: Diagnosis not present

## 2024-05-07 MED ORDER — VEDOLIZUMAB 300 MG IV SOLR
300.0000 mg | Freq: Once | INTRAVENOUS | Status: AC
  Administered 2024-05-07: 300 mg via INTRAVENOUS
  Filled 2024-05-07: qty 5

## 2024-05-07 NOTE — Progress Notes (Signed)
 Diagnosis: Ulcerative Colitis  Provider:  Mannam, Praveen MD  Procedure: IV Infusion  IV Type: Peripheral, IV Location: L Antecubital  Entyvio  (Vedolizumab ), Dose: 300 mg  Infusion Start Time: 1120  Infusion Stop Time: 1156  Post Infusion IV Care: Peripheral IV Discontinued  Discharge: Condition: Stable, Destination: Home . AVS Provided  Performed by:  Rocky FORBES Sar, RN

## 2024-05-15 DIAGNOSIS — R300 Dysuria: Secondary | ICD-10-CM | POA: Diagnosis not present

## 2024-05-15 DIAGNOSIS — N390 Urinary tract infection, site not specified: Secondary | ICD-10-CM | POA: Diagnosis not present

## 2024-05-15 NOTE — Progress Notes (Signed)
 Amy Hansen                                          MRN: [id number]   05/15/2024   The VBCI Quality Team Specialist reviewed this patient medical record for the purposes of chart review for care gap closure. The following were reviewed: abstraction for care gap closure-kidney health evaluation for diabetes:eGFR  and uACR.    VBCI Quality Team

## 2024-05-19 ENCOUNTER — Ambulatory Visit: Admitting: Physician Assistant

## 2024-05-28 DIAGNOSIS — L03032 Cellulitis of left toe: Secondary | ICD-10-CM | POA: Diagnosis not present

## 2024-05-28 DIAGNOSIS — R300 Dysuria: Secondary | ICD-10-CM | POA: Diagnosis not present

## 2024-05-28 DIAGNOSIS — N390 Urinary tract infection, site not specified: Secondary | ICD-10-CM | POA: Diagnosis not present

## 2024-06-02 ENCOUNTER — Encounter: Payer: Self-pay | Admitting: Physician Assistant

## 2024-06-02 ENCOUNTER — Ambulatory Visit: Admitting: Physician Assistant

## 2024-06-02 DIAGNOSIS — F319 Bipolar disorder, unspecified: Secondary | ICD-10-CM | POA: Diagnosis not present

## 2024-06-02 DIAGNOSIS — F411 Generalized anxiety disorder: Secondary | ICD-10-CM | POA: Diagnosis not present

## 2024-06-02 DIAGNOSIS — G2581 Restless legs syndrome: Secondary | ICD-10-CM

## 2024-06-02 MED ORDER — ALPRAZOLAM 0.5 MG PO TABS: 0.5000 mg | ORAL_TABLET | Freq: Two times a day (BID) | ORAL | 5 refills | Status: DC | PRN

## 2024-06-02 NOTE — Progress Notes (Signed)
 [facility]  Patient ID: Amy Hansen,  MRN: [id number]  PCP: Melvin Pao, NP  Date of Evaluation: 06/02/2024 Time spent:20 minutes  Chief Complaint:  Chief Complaint   Anxiety; Depression; Follow-up    HISTORY/CURRENT STATUS: For 1 month med check  We increased the Zoloft  a month ago.  States she feels a lot better. At least 50% better.  Anxiety is controlled.  Things at home are about the same, still stressful but she's coping better. Prays a lot, reads the Bible which help.  Energy and motivation are pretty good.  Cleans the house w/o problems but when she's tired or her back hurts, she sits down.  No extreme sadness, tearfulness, or feelings of hopelessness.  Sleeps well since she's started taking Mg. Personal hygiene is normal.   Denies any changes in concentration, making decisions, or remembering things.  Appetite has not changed.  Weight is stable.  No SI/HI.  No reports of increased energy with decreased need for sleep, increased talkativeness, racing thoughts, impulsivity or risky behaviors, increased spending, grandiosity, increased irritability or anger, paranoia, or hallucinations.  Individual Medical History/ Review of Systems: Changes? :No      Past medications for mental health diagnoses include: Trazodone , Risperdal , Zoloft , Lunesta, prazosin, Sonata, Prozac, Depakote, Lamictal , lithium, Wellbutrin , Xanax , Ambien , carbamazepine , Seroquel , Buspar  caused falls and hallucinations, Gabapentin -she doesn't want to take  Allergies: Avelox [moxifloxacin hcl in nacl], Bactrim  [sulfamethoxazole -trimethoprim ], Ciprofloxacin , Bacitracin , Buspar  [buspirone ], Neomycin -polymyxin-dexameth, Neosporin original [neomycin -bacitracin  zn-polymyx], Shellfish allergy, Aquaphor [lanolin-petrolatum], Depakote [divalproex sodium], Imitrex  [sumatriptan ], and Stadol [butorphanol]  Current Medications:  Current Outpatient Medications:    albuterol  (PROVENTIL ) (2.5 MG/3ML) 0.083%  nebulizer solution, Take 3 mLs (2.5 mg total) by nebulization every 6 (six) hours as needed for wheezing or shortness of breath., Disp: 150 mL, Rfl: 3   albuterol  (VENTOLIN  HFA) 108 (90 Base) MCG/ACT inhaler, Inhale 2 puffs into the lungs every 6 (six) hours as needed for wheezing or shortness of breath., Disp: 18 g, Rfl: 5   ALPHA LIPOIC ACID PO, Take 1 capsule by mouth daily., Disp: , Rfl:    amoxicillin -clavulanate (AUGMENTIN ) 875-125 MG tablet, Take 1 tablet by mouth 2 (two) times daily., Disp: 10 tablet, Rfl: 0   aspirin  EC 81 MG tablet, Take 1 tablet (81 mg) by mouth once daily. Swallow whole., Disp: , Rfl:    Cyanocobalamin  (VITAMIN B-12 PO), Take 1 tablet by mouth daily., Disp: , Rfl:    estradiol  (ESTRACE ) 0.1 MG/GM vaginal cream, Apply one pea-sized amount around the opening of the urethra 3 times weekly., Disp: 42.5 g, Rfl: 12   furosemide  (LASIX ) 20 MG tablet, Take 1 tablet (20 mg total) by mouth daily as needed (for swelling in the legs accompanied by a weight gain of 3 pounds overnight -- USE SPARINGLY)., Disp: 45 tablet, Rfl: 1   isosorbide  mononitrate (IMDUR ) 30 MG 24 hr tablet, Take 0.5 tablets (15 mg total) by mouth daily., Disp: 45 tablet, Rfl: 3   MAGNESIUM  PO, Take by mouth., Disp: , Rfl:    Nebulizers (COMPRESSOR/NEBULIZER) MISC, 1 each by Does not apply route as directed., Disp: 1 each, Rfl: 0   nitroGLYCERIN  (NITROSTAT ) 0.4 MG SL tablet, Place 1 tablet (0.4 mg total) under the tongue every 5 (five) minutes as needed., Disp: 25 tablet, Rfl: 3   ondansetron  (ZOFRAN ) 4 MG tablet, Take 1 tablet (4 mg total) by mouth every 6 (six) hours., Disp: 10 tablet, Rfl: 0   ondansetron  (ZOFRAN -ODT) 8 MG disintegrating tablet, Take 1 tablet (8  mg total) by mouth every 8 (eight) hours as needed for nausea or vomiting., Disp: 60 tablet, Rfl: 1   oxyCODONE  (ROXICODONE ) 5 MG immediate release tablet, Take 1-2 tablets (5-10 mg total) by mouth every 6 (six) hours as needed for severe pain (pain score  7-10)., Disp: 20 tablet, Rfl: 0   pantoprazole  (PROTONIX ) 40 MG tablet, Take 1 tablet (40 mg total) by mouth daily., Disp: 90 tablet, Rfl: 3   pramipexole  (MIRAPEX ) 1 MG tablet, Take 1 tablet (1 mg total) by mouth at bedtime., Disp: 90 tablet, Rfl: 1   prednisoLONE acetate (PRED FORTE) 1 % ophthalmic suspension, Place 1 drop into both eyes 2 (two) times daily., Disp: , Rfl:    QUEtiapine  (SEROQUEL ) 300 MG tablet, Take 1 tablet (300 mg total) by mouth at bedtime., Disp: 30 tablet, Rfl: 5   rOPINIRole  (REQUIP ) 1 MG tablet, Take 1 tablet (1 mg total) by mouth 3 (three) times daily., Disp: 90 tablet, Rfl: 1   rosuvastatin  (CRESTOR ) 5 MG tablet, TAKE 1 TABLET DAILY, Disp: 90 tablet, Rfl: 0   sertraline  (ZOLOFT ) 100 MG tablet, Take 1.5 tablets (150 mg total) by mouth daily., Disp: , Rfl:    tirzepatide  (MOUNJARO ) 7.5 MG/0.5ML Pen, Inject 7.5 mg into the skin once a week., Disp: 6 mL, Rfl: 1   triamcinolone  cream (KENALOG ) 0.1 %, Apply 1 Application topically 2 (two) times daily., Disp: 30 g, Rfl: 0   trimethoprim -polymyxin b (POLYTRIM) ophthalmic solution, SMARTSIG:In Eye(s), Disp: , Rfl:    ursodiol (ACTIGALL) 300 MG capsule, Take 300 mg by mouth 2 (two) times daily. Take 2 capsules in the AM and 1 capsule in the PM, Disp: , Rfl:    vedolizumab  (ENTYVIO ) 300 MG injection, Inject 300 mg into the vein every 30 (thirty) days., Disp: , Rfl:    Vitamin D , Ergocalciferol , (DRISDOL ) 1.25 MG (50000 UNIT) CAPS capsule, Take 1 capsule (50,000 Units total) by mouth every 7 (seven) days., Disp: 12 capsule, Rfl: 1   [START ON 06/06/2024] ALPRAZolam  (XANAX ) 0.5 MG tablet, Take 1 tablet (0.5 mg total) by mouth 2 (two) times daily as needed for anxiety. Must last 15 days., Disp: 30 tablet, Rfl: 5 Medication Side Effects: none  Family Medical/ Social History: Changes?  no  MENTAL HEALTH EXAM:  There were no vitals taken for this visit.There is no height or weight on file to calculate BMI.  General Appearance: Casual  and Well Groomed  Eye Contact:  Good  Speech:  Clear and Coherent and Normal Rate  Volume:  Normal  Mood:  Euthymic  Affect:  Congruent  Thought Process:  Goal Directed and Descriptions of Associations: Circumstantial  Orientation:  Full (Time, Place, and Person)  Thought Content: Logical   Suicidal Thoughts:  No  Homicidal Thoughts:  No  Memory:  at her baseline  Judgement:  Good  Insight:  Good  Psychomotor Activity:  Normal  Concentration:  Concentration: Good and Attention Span: Good  Recall:  Good  Fund of Knowledge: Good  Language: Good  Assets:  Communication Skills Desire for Improvement Financial Resources/Insurance Housing Leisure Time Resilience Social Support Transportation   ADL's:  Intact  Cognition: WNL  Prognosis:  Good   PCP follows labs  DIAGNOSES:    ICD-10-CM   1. Bipolar I disorder (HCC)  F31.9     2. Generalized anxiety disorder  F41.1     3. Restless legs syndrome (RLS)  G25.81       Receiving Psychotherapy: No   RECOMMENDATIONS:  PDMP reviewed.  Xanax  filled 05/26/2024. I provided approximately 20 minutes of face to face time during this encounter, including time spent before and after the visit in records review, medical decision making, counseling pertinent to today's visit, and charting.   She's doing better, so no changes are needed.   Continue Xanax  0.5 mg, 1 p.o. twice daily as needed.  DON'T FILL EARLY. EVER.  I'm making an exception today b/c she's going to [place] next week, when it's due.  Continue pramipexole  1 mg, 1 p.o. nightly. Continue Seroquel  300 mg, 1 at bedtime.  Continue ropinirole  1 mg, 1 p.o. twice daily. Continue Zoloft   100 mg, 1.5 pills daily. Recommend counseling.  Return in 3 months.  Verneita Cooks, PA-C

## 2024-06-04 ENCOUNTER — Ambulatory Visit (INDEPENDENT_AMBULATORY_CARE_PROVIDER_SITE_OTHER)

## 2024-06-04 ENCOUNTER — Ambulatory Visit (INDEPENDENT_AMBULATORY_CARE_PROVIDER_SITE_OTHER): Admitting: Physician Assistant

## 2024-06-04 VITALS — BP 133/78 | HR 74 | Temp 98.2°F | Resp 14 | Ht 60.0 in | Wt 113.0 lb

## 2024-06-04 DIAGNOSIS — K529 Noninfective gastroenteritis and colitis, unspecified: Secondary | ICD-10-CM | POA: Diagnosis not present

## 2024-06-04 MED ORDER — VEDOLIZUMAB 300 MG IV SOLR
300.0000 mg | Freq: Once | INTRAVENOUS | Status: AC
  Administered 2024-06-04: 300 mg via INTRAVENOUS
  Filled 2024-06-04: qty 5

## 2024-06-04 NOTE — Progress Notes (Addendum)
 Diagnosis: Pancolitis   Provider:  Praveen Mannam MD  Procedure: IV Infusion  IV Type: Peripheral, IV Location: L Antecubital  Entyvio  (Vedolizumab ), Dose: 300 mg  Infusion Start Time: 1133  Infusion Stop Time: 1204  Post Infusion IV Care: Peripheral IV Discontinued  Discharge: Condition: Good, Destination: Home . AVS Provided  Performed by:  Maximiano JONELLE Pouch, LPN

## 2024-06-11 ENCOUNTER — Other Ambulatory Visit: Payer: Self-pay | Admitting: Family Medicine

## 2024-06-13 NOTE — Telephone Encounter (Signed)
 Requested Prescriptions  Pending Prescriptions Disp Refills   rOPINIRole  (REQUIP ) 1 MG tablet [Pharmacy Med Name: ROPINIROLE  HCL 1 MG TABLET] 270 tablet 1    Sig: Take 1 tablet (1 mg total) by mouth 3 (three) times daily.     Neurology:  Parkinsonian Agents Passed - 06/13/2024  8:19 AM      Passed - Last BP in normal range    BP Readings from Last 1 Encounters:  06/04/24 133/78         Passed - Last Heart Rate in normal range    Pulse Readings from Last 1 Encounters:  06/04/24 74         Passed - Valid encounter within last 12 months    Recent Outpatient Visits           2 months ago Uncontrolled type 2 diabetes mellitus with hyperglycemia, without long-term current use of insulin  [facility])   [place] [facility] Melvin Pao, NP   2 months ago Restless legs syndrome (RLS)   [place] [facility] [place], Megan P, DO   4 months ago Hypertension associated with type 2 diabetes mellitus [facility])   [place] [facility] Melvin Pao, NP   6 months ago Hypertension associated with type 2 diabetes mellitus [facility])   [place] [facility] Melvin Pao, NP   6 months ago Hospital discharge follow-up   [place] [facility] Melvin Pao, NP       Future Appointments             In 1 week Dunn, Bernardino HERO, PA-C [facility]

## 2024-06-23 ENCOUNTER — Ambulatory Visit: Attending: Physician Assistant | Admitting: Physician Assistant

## 2024-06-23 ENCOUNTER — Encounter: Payer: Self-pay | Admitting: Physician Assistant

## 2024-06-23 VITALS — BP 99/60 | HR 91 | Ht 60.0 in | Wt 117.2 lb

## 2024-06-23 DIAGNOSIS — I959 Hypotension, unspecified: Secondary | ICD-10-CM | POA: Diagnosis not present

## 2024-06-23 DIAGNOSIS — Z79899 Other long term (current) drug therapy: Secondary | ICD-10-CM | POA: Diagnosis not present

## 2024-06-23 DIAGNOSIS — I25810 Atherosclerosis of coronary artery bypass graft(s) without angina pectoris: Secondary | ICD-10-CM | POA: Diagnosis not present

## 2024-06-23 DIAGNOSIS — I5032 Chronic diastolic (congestive) heart failure: Secondary | ICD-10-CM | POA: Diagnosis not present

## 2024-06-23 DIAGNOSIS — Z8673 Personal history of transient ischemic attack (TIA), and cerebral infarction without residual deficits: Secondary | ICD-10-CM

## 2024-06-23 DIAGNOSIS — I471 Supraventricular tachycardia, unspecified: Secondary | ICD-10-CM

## 2024-06-23 NOTE — Patient Instructions (Signed)
 Medication Instructions:  Your physician recommends that you continue on your current medications as directed. Please refer to the Current Medication list given to you today.   *If you need a refill on your cardiac medications before your next appointment, please call your pharmacy*  Lab Work: Your provider would like for you to have following labs drawn today BMeT.   If you have labs (blood work) drawn today and your tests are completely normal, you will receive your results only by: MyChart Message (if you have MyChart) OR A paper copy in the mail If you have any lab test that is abnormal or we need to change your treatment, we will call you to review the results.  Follow-Up: At [facility], you and your health needs are our priority.  As part of our continuing mission to provide you with exceptional heart care, our providers are all part of one team.  This team includes your primary Cardiologist (physician) and Advanced Practice Providers or APPs (Physician Assistants and Nurse Practitioners) who all work together to provide you with the care you need, when you need it.  Your next appointment:   6 month(s)  Provider:   You may see Lonni Hanson, MD or Bernardino Bring, PA-C  We recommend signing up for the patient portal called MyChart.  Sign up information is provided on this After Visit Summary.  MyChart is used to connect with patients for Virtual Visits (Telemedicine).  Patients are able to view lab/test results, encounter notes, upcoming appointments, etc.  Non-urgent messages can be sent to your provider as well.   To learn more about what you can do with MyChart, go to [email or web address].

## 2024-06-23 NOTE — Progress Notes (Signed)
 Cardiology Office Note    Date:  06/23/2024   ID:  Amy Hansen, DOB August 03, 1955, MRN [id number]  PCP:  Melvin Pao, NP  Cardiologist:  Lonni Hanson, MD  Electrophysiologist:  None   Chief Complaint: Follow up  History of Present Illness:   Amy Hansen is a 68 y.o. 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    She was previously followed by Dr. Monette with echo in 07/2016 showing an EF of 55 to 60%, no regional wall motion abnormalities, grade 1 diastolic dysfunction, mild mitral regurgitation, and a mildly dilated left atrium.  Treadmill MPI in 08/2016 showed no significant ischemia with adequate exercise tolerance, and was overall low risk.  Zio patch in 07/2019 showed a predominant rhythm of sinus with an average rate of 101 bpm, a single episode of NSVT lasting 9 beats, 20 episodes of SVT lasting up to 11 beats, and rare PACs/PVCs.  Patient triggered events corresponded to sinus rhythm with PACs.  Echo from 03/2021 demonstrated an EF of 60 to 65%, no regional wall motion abnormalities, grade 1 diastolic dysfunction, normal RV systolic function and ventricular cavity size, mild aortic valve sclerosis without evidence of stenosis, and an estimated right atrial pressure of 3 mmHg.      Prior MRI of the brain in 2022 demonstrated small chronic cortical/subcortical infarct within the left parietal lobe as well as a small chronic lacunar infarcts within the bilateral basal ganglia and changes consistent with chronic small vessel ischemia.     She was seen in the office in 05/2022 and reported a 1 week history of exertional shortness of breath when going up or down a flight of stairs.  She also reported an episode during the summer where she woke up on the floor  after a lamp fell on her.  At that time, CT of the head and MRI of the face showed no acute process.  It was unclear if this was associated with a syncopal episode or not.  There was no cardiac pro or postdrome.  Zio patch in 05/2022 showed a predominant rhythm of sinus with an average rate of 103 bpm (range 74 to 150 bpm in sinus), single episode of NSVT occurred lasting 4 beats with a maximum rate of 222 bpm, 11 episodes of SVT lasting up to 7 beats with a maximum rate of 148 bpm, rare PACs and PVCs, and no sustained arrhythmias or prolonged pauses.  Patient triggered events corresponded to sinus rhythm and blocked PAC.   She was seen at outside ED on 07/16/2022 with intermittent confusion and difficulty saying specific words.  CT of the head showed no acute intracranial abnormality.  High-sensitivity troponin negative.  She followed up with PCP and was found to have UTI.  Subsequent MRI of the brain in 09/2022 showed no evidence of acute intracranial abnormality with redemonstrated tiny chronic cortical infarct within the left parietal lobe, redemonstrated tiny chronic lacunar infarct within the right thalamus, and similar appearance of likely chronic small vessel ischemia.   Echo on 07/24/2022 demonstrated an EF of 50 to 55%, no regional wall motion abnormalities, mild LVH, grade 1 diastolic dysfunction, normal RV systolic function and ventricular cavity size, mildly dilated left atrium, and trivial mitral regurgitation.   She was seen in the office in 07/2022 and  continued to report episodes of transient confusion associated with dysarthria without weakness.  CTA head and neck in 11/2022 showed no acute intracranial finding with chronic small vessel ischemic changes.  No intracranial large vessel occlusion or proximal stenosis.  No suspected significant carotid artery stenosis with aortic atherosclerosis noted.   She was admitted to the hospital in 01/2023 with flank pain found to have pan positive review  of systems.  She was treated for AKI presumed to be secondary to dehydration in the setting of dysphagia with nausea and vomiting.  Found to have large amount of stool burden on CT which was likely contributing to her nausea and vomiting.  GI was consulted with very low suspicion for underlying organic GI disorder with symptoms felt to be related to medication side effects and anxiety.     She was seen in the office in 02/2023 noting increase in lower extremity edema despite taking 40 mg of furosemide  for several weeks.  Her weight was down 22 pounds by our scale when compared to her visit in 07/2022.  She was advised to take furosemide  20 mg twice daily.  Echo on 04/10/2023 showed an EF of 60 to 65%, no regional wall motion abnormalities, grade 1 diastolic dysfunction, normal RV systolic function and ventricular cavity size, mild to moderate mitral regurgitation, moderate mitral annular calcification, mild to moderate tricuspid regurgitation, and an estimated right atrial pressure of 3 mmHg.   She was seen in the office on 04/11/2023 noting intermittent episodes of pain under the left breast that radiated to the left arm.  She was without symptoms of dyspnea and continue to note waxing and waning edema in her lower extremities.  Her weight has been relatively stable.  In the setting of palpitations noted off metoprolol , this was resumed.  Subsequent coronary CTA on 05/02/2023 showed a calcium  score of 29.3 which was the 68th percentile.  There was less than 25% LCx and RCA stenosis with severe stenosis estimated at greater than 70% of proximal OM1.  ctFFR of 0.77 in the ostial OM1 with normal ctFFR of the LAD and RCA.  LHC on 05/15/2023 showed severe single-vessel CAD with 90% stenosis of proximal OM1.  There was mild to moderate disease involving the LAD and RCA as outlined below.  Sluggish flow in the distal LAD on initial images resolved with intracoronary nitroglycerin  suggesting some element of coronary  vasospasm.  Normal LVEF estimated at 55 to 65% with upper normal filling pressure with an LVEDP of 15 mmHg.  She underwent successful PCI/DES to OM1.  Following cardiac cath that she was started on Imdur  15 mg daily in the setting of 2 episodes of chest discomfort that prompted her to take SL NTG.  Previously noted ankle swelling had improved from prior visits.   She was seen in our office on 06/19/2023 noting ongoing lower extremity swelling and weight gain of 5 pounds by our scale when compared to her visit earlier in the month.  She had recently been started on pregabalin  by neurology.  She was taking furosemide  20 mg twice daily.  She continued to note sporadic chest pain that would last only few seconds in duration that was felt to be unlikely related to coronary insufficiency.  She seemed to be tolerating Imdur  and was overall feeling better since undergoing PCI to OM1 in 04/2023.  She was volume overloaded with recommendation to increase furosemide  to 40 mg twice daily and replete potassium.  With discontinuation of pregabalin  the lower extremity swelling  improved.   She was admitted to the hospital in late 05/2023 with hyperosmolar nonketotic hyperglycemia with blood glucose reading greater than 600 and treated with insulin  drip.  She was seen in the ED on 07/01/2023 with continued hyperglycemia with a glucose of 433, treated with IV fluids and addition of short acting sliding scale insulin .  Management of her diabetes has been challenging with irregular eating habits and with prior self discontinuation of Mounjaro .   She was seen in the office in 08/2023 noting recurrence in lower extremity swelling following the reinitiation of pregabalin .  She had also increased her furosemide  to 40 mg twice daily in this setting.  It was recommended she discontinue pregabalin  with subsequent improvement in lower extremity swelling noted.  She reduced her furosemide  back down to 20 mg twice daily.     She was seen  in the office in 09/2023 and was consuming a diet high in sodium.  Clopidogrel  was discontinued in 11/2023 with recommendation to continue aspirin  81 mg.  She was admitted to the hospital in late 10/2023 and again in 11/2023 multifocal pneumonia.  High-sensitivity troponin negative during those admissions.  BNP 141.    She was last seen in the office in 03/2024 with intermittent dizziness and fatigue.  BP was soft at 88/60.  She had not been checking her blood pressure or blood sugars at home.  Her weight was down 13 pounds compared to her visit in 09/2023 on GLP-1 therapy with a diminished appetite.  She was taking furosemide  20 mg every day rather than as needed.  She was drinking 6 diet Dr. Buster daily without any other fluid intake.  It was recommended that she stop Lopressor  and take furosemide  as needed as previously recommended.  She comes in doing very well from a cardiac perspective and is without symptoms of angina or cardiac decompensation.  She reports this is the best she has felt in years.  Reports 2 days after discontinuation of metoprolol  she woke up and started cleaning her house, and indicates that she has not stopped since.  Notes significant improvement in energy coming off of metoprolol .  Her weight is up 8 pounds today when compared to her visit in 03/2023, though she suspects she was likely underweight at her last visit and thinks this may be her baseline weight.  No dizziness, presyncope, or syncope.  She does report some intermittent lower extremity swelling with no swelling noted today.  No progressive orthopnea.  No falls or symptoms concerning for bleeding.  She does continue to take furosemide  20 mg daily rather than as needed.  She is very pleased with the way she feels and does not have any acute concerns at this time.   Labs independently reviewed: 03/2024 - Hgb 12.4, PLT 266, BUN 14, serum creatinine 0.74, potassium 4.3, albumin 3.8, AST/ALT normal, A1c 7.8 02/2024 - TSH normal,  magnesium  2.0 11/2023 - TC 120, TG 150, HDL 50, LDL 45  Past Medical History:  Diagnosis Date   Acute on chronic heart failure with preserved ejection fraction (HFpEF) (HCC) 05/16/2018   AKI (acute kidney injury) 02/05/2023   Anemia    Anxiety    Arthritis    Blood transfusion without reported diagnosis    Cataract    CHF (congestive heart failure) (HCC)    Chronic kidney disease    UTI, hematuria in urine   Colitis    Crohn's disease (HCC)    Depression    Diabetes (HCC)    Diverticulosis  Elevated liver enzymes    Frequent headaches    Grade I diastolic dysfunction    Interstitial cystitis    PONV (postoperative nausea and vomiting)    Primary biliary cirrhosis (HCC)    Recurrent UTI    Restless leg syndrome    TIA (transient ischemic attack) 02/20/2021   Urinary frequency     Past Surgical History:  Procedure Laterality Date   bariatric bypass  2012   BIOPSY  05/03/2020   Procedure: BIOPSY;  Surgeon: Teressa Toribio SQUIBB, MD;  Location: [facility] ENDOSCOPY;  Service: Endoscopy;;   CARPAL TUNNEL RELEASE Right 2003   CARPAL TUNNEL RELEASE Right    2008   CHOLECYSTECTOMY  1975   COLONOSCOPY     COLONOSCOPY WITH PROPOFOL  N/A 05/03/2020   Procedure: COLONOSCOPY WITH PROPOFOL ;  Surgeon: Teressa Toribio SQUIBB, MD;  Location: [facility] ENDOSCOPY;  Service: Endoscopy;  Laterality: N/A;   CORONARY STENT INTERVENTION N/A 05/15/2023   Procedure: CORONARY STENT INTERVENTION;  Surgeon: Mady Bruckner, MD;  Location: [facility] INVASIVE CV LAB;  Service: Cardiovascular;  Laterality: N/A;   CYSTOSCOPY W/ RETROGRADES Bilateral 06/06/2015   Procedure: CYSTOSCOPY WITH RETROGRADE PYELOGRAM;  Surgeon: Donnice Brooks, MD;  Location: [facility] ORS;  Service: Urology;  Laterality: Bilateral;   CYSTOSCOPY/URETEROSCOPY/HOLMIUM LASER/STENT PLACEMENT Left 03/02/2024   Procedure: CYSTOSCOPY/URETEROSCOPY/HOLMIUM LASER/STENT PLACEMENT;  Surgeon: Francisca Redell BROCKS, MD;  Location: [facility] ORS;  Service: Urology;  Laterality: Left;    EYE SURGERY Left 07/06/2022   FL INJ LEFT KNEE CT ARTHROGRAM ([facility] HX) Left    1995   GASTRIC BYPASS  2010   HAND SURGERY Left 01/19/2021   Thumb   HEMORRHOID SURGERY  2013   KNEE ARTHROSCOPY Left 1996   LEFT HEART CATH AND CORONARY ANGIOGRAPHY Left 05/15/2023   Procedure: LEFT HEART CATH AND CORONARY ANGIOGRAPHY;  Surgeon: Mady Bruckner, MD;  Location: [facility] INVASIVE CV LAB;  Service: Cardiovascular;  Laterality: Left;   TONSILLECTOMY     TOTAL ABDOMINAL HYSTERECTOMY W/ BILATERAL SALPINGOOPHORECTOMY      Current Medications: Current Meds  Medication Sig   albuterol  (PROVENTIL ) (2.5 MG/3ML) 0.083% nebulizer solution Take 3 mLs (2.5 mg total) by nebulization every 6 (six) hours as needed for wheezing or shortness of breath.   albuterol  (VENTOLIN  HFA) 108 (90 Base) MCG/ACT inhaler Inhale 2 puffs into the lungs every 6 (six) hours as needed for wheezing or shortness of breath.   ALPHA LIPOIC ACID PO Take 1 capsule by mouth daily.   ALPRAZolam  (XANAX ) 0.5 MG tablet Take 1 tablet (0.5 mg total) by mouth 2 (two) times daily as needed for anxiety. Must last 15 days.   amoxicillin -clavulanate (AUGMENTIN ) 875-125 MG tablet Take 1 tablet by mouth 2 (two) times daily.   aspirin  EC 81 MG tablet Take 1 tablet (81 mg) by mouth once daily. Swallow whole.   Cyanocobalamin  (VITAMIN B-12 PO) Take 1 tablet by mouth daily.   estradiol  (ESTRACE ) 0.1 MG/GM vaginal cream Apply one pea-sized amount around the opening of the urethra 3 times weekly.   furosemide  (LASIX ) 20 MG tablet Take 1 tablet (20 mg total) by mouth daily as needed (for swelling in the legs accompanied by a weight gain of 3 pounds overnight -- USE SPARINGLY).   isosorbide  mononitrate (IMDUR ) 30 MG 24 hr tablet Take 0.5 tablets (15 mg total) by mouth daily.   MAGNESIUM  PO Take by mouth.   Nebulizers (COMPRESSOR/NEBULIZER) MISC 1 each by Does not apply route as directed.   nitroGLYCERIN  (NITROSTAT ) 0.4 MG SL tablet Place 1 tablet (  0.4 mg  total) under the tongue every 5 (five) minutes as needed.   ondansetron  (ZOFRAN ) 4 MG tablet Take 1 tablet (4 mg total) by mouth every 6 (six) hours.   ondansetron  (ZOFRAN -ODT) 8 MG disintegrating tablet Take 1 tablet (8 mg total) by mouth every 8 (eight) hours as needed for nausea or vomiting.   oxyCODONE  (ROXICODONE ) 5 MG immediate release tablet Take 1-2 tablets (5-10 mg total) by mouth every 6 (six) hours as needed for severe pain (pain score 7-10).   pantoprazole  (PROTONIX ) 40 MG tablet Take 1 tablet (40 mg total) by mouth daily.   pramipexole  (MIRAPEX ) 1 MG tablet Take 1 tablet (1 mg total) by mouth at bedtime.   prednisoLONE acetate (PRED FORTE) 1 % ophthalmic suspension Place 1 drop into both eyes 2 (two) times daily.   QUEtiapine  (SEROQUEL ) 300 MG tablet Take 1 tablet (300 mg total) by mouth at bedtime.   rOPINIRole  (REQUIP ) 1 MG tablet Take 1 tablet (1 mg total) by mouth 3 (three) times daily.   rosuvastatin  (CRESTOR ) 5 MG tablet TAKE 1 TABLET DAILY   sertraline  (ZOLOFT ) 100 MG tablet Take 1.5 tablets (150 mg total) by mouth daily.   tirzepatide  (MOUNJARO ) 7.5 MG/0.5ML Pen Inject 7.5 mg into the skin once a week.   triamcinolone  cream (KENALOG ) 0.1 % Apply 1 Application topically 2 (two) times daily.   trimethoprim -polymyxin b (POLYTRIM) ophthalmic solution SMARTSIG:In Eye(s)   ursodiol (ACTIGALL) 300 MG capsule Take 300 mg by mouth 2 (two) times daily. Take 2 capsules in the AM and 1 capsule in the PM   vedolizumab  (ENTYVIO ) 300 MG injection Inject 300 mg into the vein every 30 (thirty) days.   Vitamin D , Ergocalciferol , (DRISDOL ) 1.25 MG (50000 UNIT) CAPS capsule Take 1 capsule (50,000 Units total) by mouth every 7 (seven) days.    Allergies:   Avelox [moxifloxacin hcl in nacl], Bactrim  [sulfamethoxazole -trimethoprim ], Ciprofloxacin , Bacitracin , Buspar  [buspirone ], Neomycin -polymyxin-dexameth, Neosporin original [neomycin -bacitracin  zn-polymyx], Shellfish allergy, Aquaphor  [lanolin-petrolatum], Depakote [divalproex sodium], Imitrex  [sumatriptan ], and Stadol [butorphanol]   Social History   Socioeconomic History   Marital status: Married    Spouse name: Not on file   Number of children: Not on file   Years of education: Not on file   Highest education level: Some college, no degree  Occupational History   Not on file  Tobacco Use   Smoking status: Former    Current packs/day: 0.00    Types: Cigarettes    Quit date: 04/25/1975    Years since quitting: 49.1   Smokeless tobacco: Never   Tobacco comments:    quit 48 years ago  Vaping Use   Vaping status: Never Used  Substance and Sexual Activity   Alcohol use: No    Alcohol/week: 0.0 standard drinks of alcohol   Drug use: No   Sexual activity: Not Currently    Birth control/protection: Post-menopausal, Surgical  Other Topics Concern   Not on file  Social History Narrative   Caffeine  5 servings per day.   Social Drivers of Health   Financial Resource Strain: Medium Risk (07/09/2023)   Overall Financial Resource Strain (CARDIA)    Difficulty of Paying Living Expenses: Somewhat hard  Food Insecurity: No Food Insecurity (11/27/2023)   Hunger Vital Sign    Worried About Running Out of Food in the Last Year: Never true    Ran Out of Food in the Last Year: Never true  Transportation Needs: No Transportation Needs (11/27/2023)   PRAPARE - Transportation  Lack of Transportation (Medical): No    Lack of Transportation (Non-Medical): No  Physical Activity: Insufficiently Active (07/09/2023)   Exercise Vital Sign    Days of Exercise per Week: 3 days    Minutes of Exercise per Session: 20 min  Stress: Stress Concern Present (07/09/2023)   [employer] of Occupational Health - Occupational Stress Questionnaire    Feeling of Stress : Very much  Social Connections: Socially Integrated (11/27/2023)   Social Connection and Isolation Panel    Frequency of Communication with Friends and Family: More  than three times a week    Frequency of Social Gatherings with Friends and Family: Twice a week    Attends Religious Services: More than 4 times per year    Active Member of [employer] or Organizations: Yes    Attends Engineer, Structural: More than 4 times per year    Marital Status: Married     Family History:  The patient's family history includes Colon cancer in her mother; Diabetes in her paternal grandmother; Heart disease in her father; Heart failure in her sister; Stroke in her father. There is no history of Bladder Cancer, Kidney disease, Prostate cancer, Kidney cancer, Pancreatic cancer, Esophageal cancer, Stomach cancer, Rectal cancer, or Breast cancer.  ROS:   12-point review of systems is negative unless otherwise noted in the HPI.   EKGs/Labs/Other Studies Reviewed:    Studies reviewed were summarized above. The additional studies were reviewed today:  LHC 05/15/2023: Conclusions: Severe single-vessel coronary artery disease with 90% stenosis of proximal OM1.  There is mild-moderate disease involving the LAD and RCA.  Sluggish flow in the distal LAD on initial images resolved with intracoronary nitroglycerin  suggesting some element of coronary vasospasm. Normal left ventricular systolic function (LVEF 55-65%) with upper normal filling pressure (LVEDP 15 mmHg). Successful PCI to OM1 using Onyx Frontier 2.25 x 18 mm drug-eluting stent (postdilated to 2.6 mm) with 0% residual stenosis and TIMI-3 flow. Small right radial artery necessitating use of 69F sheath/catheters.  Consider alternative access for future catheterizations, particularly if larger catheters are necessary.   Recommendations: Overnight observation. Dual antiplatelet therapy with aspirin  and clopidogrel  for at least 6 months. Aggressive secondary prevention of coronary artery disease.  Will continue low-dose rosuvastatin  given very low LDL and concern for myopathy with higher intensity statin  therapy. __________   Coronary 05/02/2023: FINDINGS: Aorta: Normal size. Mild aortic wall calcifications. No dissection.   Aortic Valve:  Trileaflet.  No calcifications.   Coronary Arteries:  Normal coronary origin.  Right dominance.   RCA is a dominant artery. There is calcified plaque proximally causing minimal stenosis (<25%).   Left main gives rise to LAD and LCX arteries. LM has no disease.   LAD has no plaque.   LCX is a non-dominant artery. Minimal proximal LCx stenosis (<25%). There is non calcified plaque in the proximal OM1 causing severe stenosis (>70%).   Other findings:   Normal pulmonary vein drainage into the left atrium.   Normal left atrial appendage without a thrombus.   Normal size of the pulmonary artery.   IMPRESSION: 1. Coronary calcium  score of 29.3. This was 68th percentile for age and sex matched control. 2. Normal coronary origin with right dominance. 3. Severe stenosis of proximal OM1 branch (>70%). 4. Minimal RCA and LCx stenosis (<25%) 5. CAD-RADS 4 Severe stenosis. (70-99% or > 50% left main). Cardiac catheterization is recommended. Consider symptom-guided anti-ischemic pharmacotherapy as well as risk factor modification per guideline directed care. 6. Additional  analysis with CT FFR will be submitted and reported separately.     crFFR: 1. Left Main:  No significant stenosis.   2. LAD: No significant stenosis.  FFRct 0.95 3. LCX: significant stenosis in ostial OM1. FFRct 0.77 4. RCA: No significant stenosis.  FFRct 0.92   IMPRESSION: 1. CT FFR analysis showed significant stenosis in the proximal first obtuse marginal branch (OM1). FFRct 0.77 2.  Cardiac catheterization recommended. __________   2D echo 04/10/2023: 1. Left ventricular ejection fraction, by estimation, is 60 to 65%. The  left ventricle has normal function. The left ventricle has no regional  wall motion abnormalities. Left ventricular diastolic parameters are   consistent with Grade I diastolic  dysfunction (impaired relaxation).   2. Right ventricular systolic function is normal. The right ventricular  size is normal. Tricuspid regurgitation signal is inadequate for assessing  PA pressure.   3. The mitral valve is normal in structure. Mild to moderate mitral valve  regurgitation. No evidence of mitral stenosis. Moderate mitral annular  calcification.   4. Tricuspid valve regurgitation is mild to moderate.   5. The aortic valve is tricuspid. Aortic valve regurgitation is not  visualized. No aortic stenosis is present.   6. The inferior vena cava is normal in size with greater than 50%  respiratory variability, suggesting right atrial pressure of 3 mmHg.  __________   2D echo 07/24/2022: 1. Left ventricular ejection fraction, by estimation, is 50 to 55%. Left  ventricular ejection fraction by 2D MOD biplane is 53.8 %. The left  ventricle has low normal function. The left ventricle has no regional wall  motion abnormalities. There is mild left ventricular hypertrophy. Left ventricular diastolic parameters are consistent with Grade I diastolic dysfunction (impaired relaxation). The average left ventricular global longitudinal strain is -16.8 %.   2. Right ventricular systolic function is normal. The right ventricular size is normal.   3. Left atrial size was mildly dilated.   4. The mitral valve is normal in structure. Trivial mitral valve regurgitation.   5. The aortic valve is tricuspid. Aortic valve regurgitation is not visualized. __________    Zio patch 05/2022:   The patient was monitored for 13 days, 23 hours.   The predominant rhythm was sinus with an average rate of 103 bpm (range 74-150 bpm in sinus).   There were rare PAC's and PVC's.   A single episode of nonsustained ventricular tachycardia occurred, lasting 4 beats with a maximum rate of 222 bpm.   There were 11 supraventricular runs, lasting up to 7 beats with a maximum rate of  148 bpm.   No sustained arrhythmia or prolonged pause was observed.   Patient triggered events correspond to sinus rhythm and blocked PAC.   Predominantly sinus rhythm with elevated average heart rate.  Rare PAC's and PVC's were observed, as well as a few brief episodes of NSVT and PSVT, as detailed above. __________   2D echo 04/20/2021: 1. Left ventricular ejection fraction, by estimation, is 60 to 65%. Left  ventricular ejection fraction by 2D MOD biplane is 64.3 %. The left  ventricle has normal function. The left ventricle has no regional wall  motion abnormalities. Left ventricular  diastolic parameters are consistent with Grade I diastolic dysfunction  (impaired relaxation).   2. Right ventricular systolic function is normal. The right ventricular  size is normal.   3. The mitral valve is grossly normal. No evidence of mitral valve  regurgitation.   4. The aortic valve is tricuspid.  Aortic valve regurgitation is not  visualized. Mild aortic valve sclerosis is present, with no evidence of  aortic valve stenosis.   5. The inferior vena cava is normal in size with greater than 50%  respiratory variability, suggesting right atrial pressure of 3 mmHg.   Comparison(s): LVEF 55-60%.  __________   Zio patch 07/2019: The patient was monitored for 12 days, 14 hours. The predominant rhythm was sinus with an average rate of 101 bpm (range 66 to 150 bpm in sinus). Rare PACs and PVCs were noted. A single episode of nonsustained ventricular tachycardia lasting nine beats occurred, with a maximum rate of 152 bpm. There were 20 episodes of supraventricular tachycardia lasting up to 11 beats with a maximal rate of 176 bpm. No sustained arrhythmia or prolonged pause was observed. Patient triggered events correspond to sinus rhythm with PACs.   Predominantly sinus rhythm with rare PACs and PVCs as well as brief PSVT and NSVT.  Patient triggered events correspond to sinus rhythm with  PACs. __________   Treadmill MPI 08/30/2016: Exercise  myocardial perfusion imaging study with no significant  ischemia Normal wall motion, EF estimated at 50% No EKG changes concerning for ischemia at peak stress or in recovery. Target heart rate achieved Adequate exercise tolerance, exercised for 5:50 min Low risk scan __________   2D echo 08/23/2016: - Left ventricle: The cavity size was normal. Wall thickness was    normal. Systolic function was normal. The estimated ejection    fraction was in the range of 55% to 60%. Wall motion was normal;    there were no regional wall motion abnormalities. Doppler    parameters are consistent with abnormal left ventricular    relaxation (grade 1 diastolic dysfunction).  - Mitral valve: There was mild regurgitation.  - Left atrium: The atrium was mildly dilated.   EKG:  EKG is not ordered today.    Recent Labs: 11/29/2023: B Natriuretic Peptide 141.2 03/18/2024: Magnesium  2.0; TSH 0.640 03/31/2024: ALT 19 04/22/2024: BUN 14; Creatinine, Ser 0.74; Hemoglobin 12.4; Platelets 266; Potassium 4.3; Sodium 138  Recent Lipid Panel    Component Value Date/Time   CHOL 120 12/11/2023 1029   CHOL 139 07/17/2016 1408   CHOL 148 01/10/2012 0717   TRIG 150 (H) 12/11/2023 1029   TRIG 99 07/17/2016 1408   TRIG 110 01/10/2012 0717   HDL 50 12/11/2023 1029   HDL 47 01/10/2012 0717   CHOLHDL 2.4 12/11/2023 1029   CHOLHDL 2.0 04/26/2020 0339   VLDL 13 04/26/2020 0339   VLDL 20 07/17/2016 1408   VLDL 22 01/10/2012 0717   LDLCALC 45 12/11/2023 1029   LDLCALC 79 01/10/2012 0717    PHYSICAL EXAM:    VS:  BP 99/60 (BP Location: Left Arm, Patient Position: Sitting, Cuff Size: Small)   Pulse 91   Ht 5' (1.524 m)   Wt 117 lb 3.2 oz (53.2 kg)   LMP  (LMP Unknown)   SpO2 98%   BMI 22.89 kg/m   BMI: Body mass index is 22.89 kg/m.  Physical Exam Vitals reviewed.  Constitutional:      Appearance: She is well-developed.  HENT:     Head: Normocephalic  and atraumatic.  Eyes:     General:        Right eye: No discharge.        Left eye: No discharge.  Cardiovascular:     Rate and Rhythm: Normal rate and regular rhythm.     Heart sounds: Normal heart sounds,  S1 normal and S2 normal. Heart sounds not distant. No midsystolic click and no opening snap. No murmur heard.    No friction rub.  Pulmonary:     Effort: Pulmonary effort is normal. No respiratory distress.     Breath sounds: Normal breath sounds. No decreased breath sounds, wheezing, rhonchi or rales.  Musculoskeletal:     Cervical back: Normal range of motion.     Right lower leg: No edema.     Left lower leg: No edema.  Skin:    General: Skin is warm and dry.     Nails: There is no clubbing.  Neurological:     Mental Status: She is alert and oriented to person, place, and time.  Psychiatric:        Speech: Speech normal.        Behavior: Behavior normal.        Thought Content: Thought content normal.        Judgment: Judgment normal.     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  LDL 45 in 11/2023.  Remains on aspirin  81 mg, Imdur  15 mg, and rosuvastatin  5 mg.  No indication for further ischemic testing at this time.  HFpEF: Euvolemic and well compensated.  Continues to take furosemide  20 mg daily rather than as needed.  Reports her legs swell if she does not take this daily.  Check BMP today.  Defer addition of MRA or SGLT2 inhibitor at this time given concern for off target effect and in the context of multiple medication intolerances as well as with relative hypotension.  PSVT: Quiescent, no longer on Lopressor  given relative hypotension.  Hypotension: Likely in the setting of ongoing weight loss with BP  largely stabilized.  Notes significant improvement in energy following discontinuation of metoprolol .  Maintain adequate hydration.  History of CVA: No new deficits.  Remains on aspirin  81 mg and rosuvastatin  milligram.    Disposition: F/u with Dr. Mady or an APP in 6 months.   Medication Adjustments/Labs and Tests Ordered: Current medicines are reviewed at length with the patient today.  Concerns regarding medicines are outlined above. Medication changes, Labs and Tests ordered today are summarized above and listed in the Patient Instructions accessible in Encounters.   Signed, Bernardino Bring, PA-C 06/23/2024 4:13 PM     [facility] [place] [address] [place], [place] [address] [phone]

## 2024-06-24 ENCOUNTER — Ambulatory Visit: Payer: Self-pay | Admitting: Physician Assistant

## 2024-06-24 LAB — BASIC METABOLIC PANEL WITH GFR
BUN/Creatinine Ratio: 20 (ref 12–28)
BUN: 14 mg/dL (ref 8–27)
CO2: 22 mmol/L (ref 20–29)
Calcium: 9 mg/dL (ref 8.7–10.3)
Chloride: 97 mmol/L (ref 96–106)
Creatinine, Ser: 0.71 mg/dL (ref 0.57–1.00)
Glucose: 103 mg/dL — ABNORMAL HIGH (ref 70–99)
Potassium: 4.3 mmol/L (ref 3.5–5.2)
Sodium: 135 mmol/L (ref 134–144)
eGFR: 93 mL/min/1.73 (ref >59)

## 2024-06-26 ENCOUNTER — Other Ambulatory Visit: Payer: Self-pay | Admitting: Nurse Practitioner

## 2024-06-30 ENCOUNTER — Telehealth: Payer: Self-pay | Admitting: Nurse Practitioner

## 2024-06-30 NOTE — Telephone Encounter (Signed)
 Requested medication (s) are due for refill today: yes  Requested medication (s) are on the active medication list: yes  Last refill:  01/16/24  Future visit scheduled: {Yes  Notes to clinic:  Medication not assigned to a protocol, review manually.      Requested Prescriptions  Pending Prescriptions Disp Refills   MOUNJARO  7.5 MG/0.5ML Pen [Pharmacy Med Name: Mounjaro  7.5 MG/0.5ML Subcutaneous Solution Auto-injector] 12 mL 0    Sig: INJECT 7.5MG  SUBCUTANEOUSLY  ONCE A WEEK     Off-Protocol Failed - 06/30/2024  1:40 PM      Failed - Medication not assigned to a protocol, review manually.      Passed - Valid encounter within last 12 months    Recent Outpatient Visits           3 months ago Uncontrolled type 2 diabetes mellitus with hyperglycemia, without long-term current use of insulin  [facility])   [place] [facility] Melvin Pao, NP   3 months ago Restless legs syndrome (RLS)   [place] [facility], Megan P, DO   5 months ago Hypertension associated with type 2 diabetes mellitus [facility])   [place] [facility] Melvin Pao, NP   6 months ago Hypertension associated with type 2 diabetes mellitus [facility])   [place] [facility] Melvin Pao, NP   6 months ago Hospital discharge follow-up   [place] [facility] Melvin Pao, NP       Future Appointments             In 5 months Dunn, Bernardino HERO, PA-C [facility]

## 2024-06-30 NOTE — Telephone Encounter (Signed)
 Patient dropped off Disability Parking Form to be filled out by provider. Patient is requesting a call back at [phone] within 2-5 days when completed. Document is located in providers folder.

## 2024-07-01 NOTE — Telephone Encounter (Signed)
 Form received. Placed in provider's folder to review and sign.

## 2024-07-01 NOTE — Telephone Encounter (Signed)
 Form signed by provider. Copy placed in scan bin and original placed up front for patient to pick up.   Called and LVM letting patient know that her form was ready to be picked up.

## 2024-07-02 ENCOUNTER — Ambulatory Visit: Admitting: Nurse Practitioner

## 2024-07-03 ENCOUNTER — Other Ambulatory Visit: Payer: Self-pay | Admitting: Nurse Practitioner

## 2024-07-07 ENCOUNTER — Ambulatory Visit

## 2024-07-07 VITALS — BP 117/74 | HR 78 | Temp 98.2°F | Resp 14 | Ht <= 58 in | Wt 113.5 lb

## 2024-07-07 DIAGNOSIS — K529 Noninfective gastroenteritis and colitis, unspecified: Secondary | ICD-10-CM

## 2024-07-07 MED ORDER — VEDOLIZUMAB 300 MG IV SOLR
300.0000 mg | Freq: Once | INTRAVENOUS | Status: AC
  Administered 2024-07-07: 300 mg via INTRAVENOUS
  Filled 2024-07-07: qty 5

## 2024-07-07 NOTE — Progress Notes (Signed)
 Diagnosis:   Pancolitis    Provider:  Praveen Mannam MD  Procedure: IV Infusion  IV Type: Peripheral, IV Location: L Antecubital   Entyvio  (Vedolizumab ), Dose: 300 mg  Infusion Start Time: 1127  Infusion Stop Time: 1205  Post Infusion IV Care: Peripheral IV Discontinued  Discharge: Condition: Good, Destination: Home . AVS Declined  Performed by:  Woodward Klem, RN

## 2024-07-08 ENCOUNTER — Other Ambulatory Visit: Payer: Self-pay | Admitting: Nurse Practitioner

## 2024-07-08 ENCOUNTER — Other Ambulatory Visit: Payer: Self-pay | Admitting: Internal Medicine

## 2024-07-09 NOTE — Telephone Encounter (Signed)
 Requested Prescriptions  Pending Prescriptions Disp Refills   pramipexole  (MIRAPEX ) 1 MG tablet [Pharmacy Med Name: PRAMIPEXOLE  1 MG TABLET] 90 tablet 1    Sig: Take 1 tablet (1 mg total) by mouth at bedtime.     Neurology:  Parkinsonian Agents Passed - 07/09/2024  5:32 PM      Passed - Last BP in normal range    BP Readings from Last 1 Encounters:  07/07/24 117/74         Passed - Last Heart Rate in normal range    Pulse Readings from Last 1 Encounters:  07/07/24 78         Passed - Valid encounter within last 12 months    Recent Outpatient Visits           3 months ago Uncontrolled type 2 diabetes mellitus with hyperglycemia, without long-term current use of insulin  (HCC)   [place] [facility] Melvin Pao, NP   3 months ago Restless legs syndrome (RLS)   [place] [facility] [place], Megan P, DO   5 months ago Hypertension associated with type 2 diabetes mellitus [facility])   [place] [facility] Melvin Pao, NP   7 months ago Hypertension associated with type 2 diabetes mellitus [facility])   [place] [facility] Melvin Pao, NP   7 months ago Hospital discharge follow-up   [facility] Melvin Pao, NP       Future Appointments             In 5 months Dunn, Bernardino HERO, PA-C  [facility] at [facility]

## 2024-07-10 DIAGNOSIS — N390 Urinary tract infection, site not specified: Secondary | ICD-10-CM | POA: Diagnosis not present

## 2024-07-10 DIAGNOSIS — R300 Dysuria: Secondary | ICD-10-CM | POA: Diagnosis not present

## 2024-07-27 ENCOUNTER — Encounter: Payer: Self-pay | Admitting: Nurse Practitioner

## 2024-07-27 ENCOUNTER — Ambulatory Visit (INDEPENDENT_AMBULATORY_CARE_PROVIDER_SITE_OTHER): Admitting: Nurse Practitioner

## 2024-07-27 VITALS — BP 136/88 | HR 73 | Temp 97.6°F | Ht 60.0 in | Wt 118.0 lb

## 2024-07-27 DIAGNOSIS — F1320 Sedative, hypnotic or anxiolytic dependence, uncomplicated: Secondary | ICD-10-CM

## 2024-07-27 DIAGNOSIS — I25110 Atherosclerotic heart disease of native coronary artery with unstable angina pectoris: Secondary | ICD-10-CM | POA: Diagnosis not present

## 2024-07-27 DIAGNOSIS — I152 Hypertension secondary to endocrine disorders: Secondary | ICD-10-CM | POA: Diagnosis not present

## 2024-07-27 DIAGNOSIS — E785 Hyperlipidemia, unspecified: Secondary | ICD-10-CM | POA: Diagnosis not present

## 2024-07-27 DIAGNOSIS — E1159 Type 2 diabetes mellitus with other circulatory complications: Secondary | ICD-10-CM | POA: Diagnosis not present

## 2024-07-27 DIAGNOSIS — D509 Iron deficiency anemia, unspecified: Secondary | ICD-10-CM

## 2024-07-27 DIAGNOSIS — Z7985 Long-term (current) use of injectable non-insulin antidiabetic drugs: Secondary | ICD-10-CM | POA: Diagnosis not present

## 2024-07-27 DIAGNOSIS — G2581 Restless legs syndrome: Secondary | ICD-10-CM

## 2024-07-27 NOTE — Assessment & Plan Note (Signed)
 Chronic.  Controlled.  Continue with current medication regimen.  Labs ordered today.  Return to clinic in 6 months for reevaluation.  Call sooner if concerns arise.  ? ?

## 2024-07-27 NOTE — Assessment & Plan Note (Signed)
Chronic.  Controlled.  Continue with current medication regimen of Rosuvastatin.  Labs ordered today.  Return to clinic in 6 months for reevaluation.  Call sooner if concerns arise.    

## 2024-07-27 NOTE — Assessment & Plan Note (Signed)
 Chronic.  Controlled.  Continue with current medication regimen of Mounjaro .  Last A1c was 7.8%.  Labs ordered today.  Return to clinic in 3 months for reevaluation.  Call sooner if concerns arise.

## 2024-07-27 NOTE — Assessment & Plan Note (Signed)
 Chronic. Continue with Plavix  and rosuvastatin .  Would benefit from increased dose of Rosuvastatin .

## 2024-07-27 NOTE — Assessment & Plan Note (Signed)
 Chronic.  Controlled.  Continue with current medication regimen of Mirapex , Requip  and Magnesium .  Labs ordered today.  Return to clinic in 6 months for reevaluation.  Call sooner if concerns arise.

## 2024-07-27 NOTE — Assessment & Plan Note (Signed)
 Chronic.  Well controlled at this time.  She is no longer on Metoprolol  due to hypotension.  Recommend checking blood pressures at home regularly.   Follow up in 3 months.  Call sooner if concerns arise.

## 2024-07-27 NOTE — Assessment & Plan Note (Signed)
 Chronic.  Followed by psychiatry.  Aware of the risks of taking long term Benzodiazepine. Continue with current medication regimen.  Continue to follow up with specialist.  Recent note reviewed from psychiatry appointment.

## 2024-07-27 NOTE — Progress Notes (Signed)
 "  BP 136/88 (BP Location: Left Arm, Cuff Size: Small)   Pulse 73   Temp 97.6 F (36.4 C) (Oral)   Ht 5' (1.524 m)   Wt 118 lb (53.5 kg)   LMP  (LMP Unknown)   SpO2 95%   BMI 23.05 kg/m    Subjective:    Patient ID: Amy Hansen, female    DOB: [birth year], 68 y.o.   MRN: [id number]  HPI: Amy Hansen is a 68 y.o. female  Chief Complaint  Patient presents with   Hypertension   Diabetes   Hyperlipidemia   Headache   DIABETES Last A1c 7.8%in September.  She is currently taking her Mounjaro .  She is tolerating the medication well and okay with increasing the dose. She feels like she is doing great.    She is followed by Dr. Leigh for GI, for her pancolitis.  She did miss her recent appt. (takes Entyvio )  Hypoglycemic episodes:no Polydipsia/polyuria: no Visual disturbance: no Chest pain: no Paresthesias: no Glucose Monitoring: yes  Accucheck frequency: sometimes  Fasting glucose: 109-160  Post prandial:  Evening:  Before meals: Taking Insulin ?: no  Long acting insulin :  Short acting insulin : Blood Pressure Monitoring: not checking Retinal Examination: Up to Date -- Dr. Carolee New Exam: Up to Date Pneumovax: Up to Date Influenza: Up to Date Aspirin : no   HYPERTENSION / HYPERLIPIDEMIA/HF Currently has Lasix  to take and Rosuvastatin . On Imdur . She is no longer on Metoprolol .  Satisfied with current treatment? yes Duration of hypertension: chronic BP monitoring frequency: rarely BP range:  BP medication side effects: no Duration of hyperlipidemia: chronic Cholesterol medication side effects: no Cholesterol supplements: none Medication compliance: fair compliance Aspirin : no Recent stressors: yes Recurrent headaches: no Visual changes: no Palpitations: no Dyspnea: yes occasional recently Chest pain: no Lower extremity edema: no Dizzy/lightheaded: yes   DEPRESSION Patient states she feels like her mood has been great.  She does still have anxiety.   She still gets jerky with anxiety.  She isn't taking less medications.  She is using less Xanax .    Has Sertraline , Seroquel , and Xanax .  She was previously being weaned off Xanax  but it appears she is on a stable dose at this time.  Mood status: stable Satisfied with current treatment?: yes Symptom severity: moderate  Duration of current treatment : chronic Side effects: no Medication compliance: good compliance Psychotherapy/counseling: yes in the past Depressed mood: yes Anxious mood: yes Anhedonia: yes Significant weight loss or gain: no Insomnia: yes hard to stay asleep Fatigue: yes Feelings of worthlessness or guilt: no Impaired concentration/indecisiveness: no Suicidal ideations: no Hopelessness: no Crying spells: yes    12/11/2023   10:23 AM 10/07/2023   10:27 AM 07/11/2023   10:41 AM 01/04/2023    2:54 PM 12/31/2022    2:13 PM  Depression screen PHQ 2/9  Decreased Interest 0 0 3 2 2   Down, Depressed, Hopeless 0 0 3 3 3   PHQ - 2 Score 0 0 6 5 5   Altered sleeping   3 3 3   Tired, decreased energy   3 2 2   Change in appetite   3 3 3   Feeling bad or failure about yourself    3 3 3   Trouble concentrating   3 3 3   Moving slowly or fidgety/restless   3 0 3  Suicidal thoughts   3 2 3   PHQ-9 Score   27  21  25    Difficult doing work/chores   Extremely dIfficult Extremely  dIfficult Extremely dIfficult     Data saved with a previous flowsheet row definition       07/11/2023   10:45 AM 12/31/2022    2:15 PM 01/18/2022    3:25 PM 08/01/2021    9:36 AM  GAD 7 : Generalized Anxiety Score  Nervous, Anxious, on Edge 3 3 3 3   Control/stop worrying 3 3 2 3   Worry too much - different things 3 3 3 3   Trouble relaxing 3 3 2 3   Restless 3 3 3 3   Easily annoyed or irritable 3 3 3 1   Afraid - awful might happen 3 3 3 1   Total GAD 7 Score 21 21 19 17   Anxiety Difficulty Extremely difficult Extremely difficult Very difficult Somewhat difficult   RESTLESS LEGS Doing well with Requip ,  Mirapex , and Magnesium  Duration: years Discomfort description:  Creeping Pain: no Location: lower legs Bilateral: yes Symmetric: yes Severity: 6/10 Onset:  gradual Frequency:  intermittent Symptoms only occur while legs at rest: yes Sudden unintentional leg jerking: yes Bed partner bothered by leg movements: yes LE numbness: no Decreased sensation: yes Weakness: yes Insomnia: yes Daytime somnolence: yes Fatigue: yes Alleviating factors:  Aggravating factors:  Status: stable Treatments attempted:    Relevant past medical, surgical, family and social history reviewed and updated as indicated. Interim medical history since our last visit reviewed. Allergies and medications reviewed and updated.  Review of Systems  Eyes:  Negative for visual disturbance.  Respiratory:  Negative for cough, chest tightness and shortness of breath.   Cardiovascular:  Negative for chest pain, palpitations and leg swelling.  Gastrointestinal:  Negative for diarrhea, nausea and vomiting.  Endocrine: Negative for polydipsia and polyuria.  Neurological:  Negative for dizziness, numbness and headaches.  Psychiatric/Behavioral:  Positive for dysphoric mood. The patient is nervous/anxious.     Per HPI unless specifically indicated above     Objective:    BP 136/88 (BP Location: Left Arm, Cuff Size: Small)   Pulse 73   Temp 97.6 F (36.4 C) (Oral)   Ht 5' (1.524 m)   Wt 118 lb (53.5 kg)   LMP  (LMP Unknown)   SpO2 95%   BMI 23.05 kg/m   Wt Readings from Last 3 Encounters:  07/27/24 118 lb (53.5 kg)  07/07/24 113 lb 8 oz (51.5 kg)  06/23/24 117 lb 3.2 oz (53.2 kg)    Physical Exam Vitals and nursing note reviewed.  Constitutional:      General: She is awake. She is not in acute distress.    Appearance: Normal appearance. She is well-developed, well-groomed and underweight. She is not ill-appearing or toxic-appearing.  HENT:     Head: Normocephalic.     Right Ear: Hearing and external  ear normal.     Left Ear: Hearing and external ear normal.  Eyes:     General: Lids are normal.        Right eye: No discharge.        Left eye: No discharge.     Conjunctiva/sclera: Conjunctivae normal.     Pupils: Pupils are equal, round, and reactive to light.  Neck:     Thyroid : No thyromegaly.     Vascular: No carotid bruit.  Cardiovascular:     Rate and Rhythm: Regular rhythm. Tachycardia present.     Heart sounds: Normal heart sounds. No murmur heard.    No gallop.     Comments: Heart rate on auscultation 123 with moving from chair to exam table.  Regular. Pulmonary:     Effort: Pulmonary effort is normal. No accessory muscle usage or respiratory distress.     Breath sounds: Normal breath sounds. No decreased breath sounds, wheezing or rhonchi.  Abdominal:     General: Abdomen is flat. Bowel sounds are normal. There is no distension.     Palpations: Abdomen is soft.     Tenderness: There is no abdominal tenderness. There is no right CVA tenderness or left CVA tenderness.  Musculoskeletal:     Cervical back: Normal range of motion and neck supple.     Right lower leg: No edema.     Left lower leg: No edema.  Lymphadenopathy:     Cervical: No cervical adenopathy.  Skin:    General: Skin is warm and dry.  Neurological:     Mental Status: She is alert and oriented to person, place, and time.     Deep Tendon Reflexes: Reflexes are normal and symmetric.     Reflex Scores:      Brachioradialis reflexes are 2+ on the right side and 2+ on the left side.      Patellar reflexes are 2+ on the right side and 2+ on the left side. Psychiatric:        Attention and Perception: Attention normal.        Mood and Affect: Mood normal.        Speech: Speech normal.        Behavior: Behavior normal. Behavior is cooperative.        Thought Content: Thought content normal.     Results for orders placed or performed in visit on 06/23/24  Basic metabolic panel with GFR   Collection Time:  06/23/24  2:57 PM  Result Value Ref Range   Glucose 103 (H) 70 - 99 mg/dL   BUN 14 8 - 27 mg/dL   Creatinine, Ser 9.28 0.57 - 1.00 mg/dL   eGFR 93 >40 fO/fpw/8.26   BUN/Creatinine Ratio 20 12 - 28   Sodium 135 134 - 144 mmol/L   Potassium 4.3 3.5 - 5.2 mmol/L   Chloride 97 96 - 106 mmol/L   CO2 22 20 - 29 mmol/L   Calcium  9.0 8.7 - 10.3 mg/dL   *Note: Due to a large number of results and/or encounters for the requested time period, some results have not been displayed. A complete set of results can be found in Results Review.      Assessment & Plan:   Problem List Items Addressed This Visit       Cardiovascular and Mediastinum   Hypertension associated with type 2 diabetes mellitus (HCC)   Chronic.  Well controlled at this time.  She is no longer on Metoprolol  due to hypotension.  Recommend checking blood pressures at home regularly.   Follow up in 3 months.  Call sooner if concerns arise.       Relevant Orders   Comprehensive metabolic panel with GFR   Coronary artery disease involving native coronary artery of native heart with unstable angina pectoris (HCC)   Chronic. Continue with Plavix  and rosuvastatin .  Would benefit from increased dose of Rosuvastatin .           Endocrine   Diabetes mellitus treated with injections of non-insulin  medication (HCC)   Chronic.  Controlled.  Continue with current medication regimen of Mounjaro .  Last A1c was 7.8%.  Labs ordered today.  Return to clinic in 3 months for reevaluation.  Call sooner if concerns arise.  Relevant Orders   Hemoglobin A1c     Other   Restless legs syndrome (RLS)   Chronic.  Controlled.  Continue with current medication regimen of Mirapex , Requip  and Magnesium .  Labs ordered today.  Return to clinic in 6 months for reevaluation.  Call sooner if concerns arise.        Iron deficiency anemia - Primary   Chronic.  Controlled.  Continue with current medication regimen.  Labs ordered today.  Return to  clinic in 6 months for reevaluation.  Call sooner if concerns arise.        Relevant Orders   CBC With Diff/Platelet   Hyperlipidemia LDL goal <70   Chronic.  Controlled.  Continue with current medication regimen of Rosuvastatin .  Labs ordered today.  Return to clinic in 6 months for reevaluation.  Call sooner if concerns arise.        Relevant Orders   Lipid panel   Benzodiazepine dependence (HCC)   Chronic.  Followed by psychiatry.  Aware of the risks of taking long term Benzodiazepine. Continue with current medication regimen.  Continue to follow up with specialist.  Recent note reviewed from psychiatry appointment.         Follow up plan: Return in about 3 months (around 10/25/2024) for HTN, HLD, DM2 FU.      "

## 2024-07-28 ENCOUNTER — Ambulatory Visit: Payer: Self-pay | Admitting: Nurse Practitioner

## 2024-07-28 LAB — LIPID PANEL
Chol/HDL Ratio: 2 ratio (ref 0.0–4.4)
Cholesterol, Total: 97 mg/dL — ABNORMAL LOW (ref 100–199)
HDL: 49 mg/dL
LDL Chol Calc (NIH): 30 mg/dL (ref 0–99)
Triglycerides: 93 mg/dL (ref 0–149)
VLDL Cholesterol Cal: 18 mg/dL (ref 5–40)

## 2024-07-28 LAB — HEMOGLOBIN A1C
Est. average glucose Bld gHb Est-mCnc: 117 mg/dL
Hgb A1c MFr Bld: 5.7 % — ABNORMAL HIGH (ref 4.8–5.6)

## 2024-07-28 LAB — COMPREHENSIVE METABOLIC PANEL WITH GFR
ALT: 18 IU/L (ref 0–32)
AST: 26 IU/L (ref 0–40)
Albumin: 4.2 g/dL (ref 3.9–4.9)
Alkaline Phosphatase: 166 IU/L — ABNORMAL HIGH (ref 49–135)
BUN/Creatinine Ratio: 16 (ref 12–28)
BUN: 11 mg/dL (ref 8–27)
Bilirubin Total: 0.3 mg/dL (ref 0.0–1.2)
CO2: 25 mmol/L (ref 20–29)
Calcium: 9.4 mg/dL (ref 8.7–10.3)
Chloride: 97 mmol/L (ref 96–106)
Creatinine, Ser: 0.69 mg/dL (ref 0.57–1.00)
Globulin, Total: 2.4 g/dL (ref 1.5–4.5)
Glucose: 124 mg/dL — ABNORMAL HIGH (ref 70–99)
Potassium: 4.3 mmol/L (ref 3.5–5.2)
Sodium: 135 mmol/L (ref 134–144)
Total Protein: 6.6 g/dL (ref 6.0–8.5)
eGFR: 95 mL/min/1.73

## 2024-07-28 LAB — CBC WITH DIFF/PLATELET
Basophils Absolute: 0 x10E3/uL (ref 0.0–0.2)
Basos: 0 %
EOS (ABSOLUTE): 0.1 x10E3/uL (ref 0.0–0.4)
Eos: 1 %
Hematocrit: 36.2 % (ref 34.0–46.6)
Hemoglobin: 11.5 g/dL (ref 11.1–15.9)
Immature Grans (Abs): 0 x10E3/uL (ref 0.0–0.1)
Immature Granulocytes: 0 %
Lymphocytes Absolute: 1.7 x10E3/uL (ref 0.7–3.1)
Lymphs: 35 %
MCH: 30.3 pg (ref 26.6–33.0)
MCHC: 31.8 g/dL (ref 31.5–35.7)
MCV: 95 fL (ref 79–97)
Monocytes Absolute: 0.3 x10E3/uL (ref 0.1–0.9)
Monocytes: 6 %
Neutrophils Absolute: 2.8 x10E3/uL (ref 1.4–7.0)
Neutrophils: 58 %
Platelets: 261 x10E3/uL (ref 150–450)
RBC: 3.8 x10E6/uL (ref 3.77–5.28)
RDW: 12.3 % (ref 11.7–15.4)
WBC: 4.8 x10E3/uL (ref 3.4–10.8)

## 2024-08-01 ENCOUNTER — Other Ambulatory Visit: Payer: Self-pay | Admitting: Internal Medicine

## 2024-08-04 ENCOUNTER — Ambulatory Visit

## 2024-08-06 ENCOUNTER — Ambulatory Visit (INDEPENDENT_AMBULATORY_CARE_PROVIDER_SITE_OTHER)

## 2024-08-06 ENCOUNTER — Ambulatory Visit: Admitting: Physician Assistant

## 2024-08-06 ENCOUNTER — Encounter: Payer: Self-pay | Admitting: Physician Assistant

## 2024-08-06 VITALS — BP 99/66 | HR 90 | Ht 60.0 in | Wt 113.9 lb

## 2024-08-06 VITALS — BP 113/71 | HR 78 | Temp 97.8°F | Resp 14 | Ht 60.0 in | Wt 113.0 lb

## 2024-08-06 DIAGNOSIS — K529 Noninfective gastroenteritis and colitis, unspecified: Secondary | ICD-10-CM

## 2024-08-06 DIAGNOSIS — N390 Urinary tract infection, site not specified: Secondary | ICD-10-CM

## 2024-08-06 LAB — URINALYSIS, COMPLETE
Bilirubin, UA: NEGATIVE
Ketones, UA: NEGATIVE
Leukocytes,UA: NEGATIVE
Nitrite, UA: NEGATIVE
Protein,UA: NEGATIVE
RBC, UA: NEGATIVE
Specific Gravity, UA: 1.015 (ref 1.005–1.030)
Urobilinogen, Ur: 0.2 mg/dL (ref 0.2–1.0)
pH, UA: 5.5 (ref 5.0–7.5)

## 2024-08-06 LAB — MICROSCOPIC EXAMINATION

## 2024-08-06 MED ORDER — VEDOLIZUMAB 300 MG IV SOLR
300.0000 mg | Freq: Once | INTRAVENOUS | Status: AC
  Administered 2024-08-06: 300 mg via INTRAVENOUS
  Filled 2024-08-06: qty 5

## 2024-08-06 NOTE — Progress Notes (Signed)
 Diagnosis: Pancolitis   Provider:  Praveen Mannam MD  Procedure: IV Infusion  IV Type: Peripheral, IV Location: L Antecubital   Entyvio  (Vedolizumab ), Dose: 300 mg  Infusion Start Time: 1158  Infusion Stop Time: 1234  Post Infusion IV Care: Peripheral IV Discontinued  Discharge: Condition: Good, Destination: Home . AVS Declined  Performed by:  Maximiano JONELLE Pouch, LPN

## 2024-08-06 NOTE — Patient Instructions (Signed)
 Recurrent UTI Prevention Strategies  Stay well hydrated. Start taking an over-the-counter cranberry supplement, with or without d-mannose, for urinary tract health. Take this once or twice daily on an empty stomach, e.g. right before bed. Continue vaginal estrogen cream. Apply a pea-sized amount around the opening of the urethra three times weekly forever.

## 2024-08-06 NOTE — Progress Notes (Signed)
 "  08/06/2024 9:25 AM   Amy Hansen [birth year] [id number]  CC: Chief Complaint  Patient presents with   Recurrent UTI   HPI: Amy Hansen is a 69 y.o. 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   Today she reports she was treated for UTI over Christmas.  Urine culture grew Proteus.  She is mostly feeling better, but admits to some occasional burning discomfort in her bladder and just wanted her urine checked today to make sure she did not still have an infection.  He denies fevers.  Notably, she was previously colonized with MDR Klebsiella prior to treating her left kidney stone in August 2025.  Since then, she has had multiple Proteus positive urine cultures.  In-office UA today positive for trace glucose; urine microscopy with many bacteria.   PMH: Past Medical History:  Diagnosis Date   Acute on chronic heart failure with preserved ejection fraction (HFpEF) (HCC) 05/16/2018   AKI (acute kidney injury) 02/05/2023   Anemia    Anxiety    Arthritis    Blood transfusion without reported diagnosis    Cataract    CHF (congestive heart failure) (HCC)    Chronic kidney disease    UTI, hematuria in urine   Colitis    Crohn's disease (HCC)    Depression    Diabetes (HCC)    Diverticulosis    Elevated liver enzymes    Frequent headaches    Grade I diastolic dysfunction    Interstitial cystitis    PONV (postoperative nausea and vomiting)    Primary biliary cirrhosis (HCC)    Recurrent UTI    Restless leg syndrome    TIA (transient ischemic attack) 02/20/2021   Urinary frequency     Surgical History: Past Surgical History:  Procedure Laterality Date   bariatric bypass  2012   BIOPSY  05/03/2020   Procedure: BIOPSY;  Surgeon: Teressa Toribio SQUIBB, MD;  Location: [facility] ENDOSCOPY;  Service: Endoscopy;;   CARPAL  TUNNEL RELEASE Right 2003   CARPAL TUNNEL RELEASE Right    2008   CHOLECYSTECTOMY  1975   COLONOSCOPY     COLONOSCOPY WITH PROPOFOL  N/A 05/03/2020   Procedure: COLONOSCOPY WITH PROPOFOL ;  Surgeon: Teressa Toribio SQUIBB, MD;  Location: [facility] ENDOSCOPY;  Service: Endoscopy;  Laterality: N/A;   CORONARY STENT INTERVENTION N/A 05/15/2023   Procedure: CORONARY STENT INTERVENTION;  Surgeon: Mady Bruckner, MD;  Location: [facility] INVASIVE CV LAB;  Service: Cardiovascular;  Laterality: N/A;   CYSTOSCOPY W/ RETROGRADES Bilateral 06/06/2015   Procedure: CYSTOSCOPY WITH RETROGRADE PYELOGRAM;  Surgeon: Donnice Brooks, MD;  Location: [facility] ORS;  Service: Urology;  Laterality: Bilateral;   CYSTOSCOPY/URETEROSCOPY/HOLMIUM LASER/STENT PLACEMENT Left 03/02/2024   Procedure: CYSTOSCOPY/URETEROSCOPY/HOLMIUM LASER/STENT PLACEMENT;  Surgeon: Francisca Redell BROCKS, MD;  Location: [facility] ORS;  Service: Urology;  Laterality: Left;   EYE SURGERY Left 07/06/2022   FL INJ LEFT KNEE CT ARTHROGRAM ([facility] HX) Left    1995   GASTRIC BYPASS  2010   HAND SURGERY Left 01/19/2021   Thumb   HEMORRHOID SURGERY  2013   KNEE ARTHROSCOPY Left 1996   LEFT HEART CATH AND CORONARY ANGIOGRAPHY Left 05/15/2023   Procedure: LEFT HEART CATH AND CORONARY ANGIOGRAPHY;  Surgeon: Mady Bruckner, MD;  Location: [facility] INVASIVE CV LAB;  Service: Cardiovascular;  Laterality: Left;   TONSILLECTOMY     TOTAL ABDOMINAL HYSTERECTOMY W/ BILATERAL  SALPINGOOPHORECTOMY      Home Medications:  Allergies as of 08/06/2024       Reactions   Avelox [moxifloxacin Hcl In Nacl] Anaphylaxis   Bactrim  [sulfamethoxazole -trimethoprim ] Anaphylaxis   Ciprofloxacin  Other (See Comments)   Pt states she was told never to take this as it is in the same family as Avelox.    Bacitracin  Swelling   rash   Buspar  [buspirone ] Other (See Comments)   hallucinations   Neomycin -polymyxin-dexameth Other (See Comments)   Burning in the eyes   Neosporin Original [neomycin -bacitracin   Zn-polymyx] Swelling   Shellfish Allergy Hives, Swelling   Face and lips   Aquaphor [lanolin-petrolatum] Itching, Rash   Depakote [divalproex Sodium] Other (See Comments)   Unknown Reaction   Imitrex  [sumatriptan ] Other (See Comments)   Neck and shoulder pain   Stadol [butorphanol] Rash        Medication List        Accurate as of August 06, 2024  9:25 AM. If you have any questions, ask your nurse or doctor.          pramipexole  1 MG tablet Commonly known as: MIRAPEX  Take 1 tablet (1 mg total) by mouth at bedtime. The timing of this medication is very important.   rOPINIRole  1 MG tablet Commonly known as: REQUIP  Take 1 tablet (1 mg total) by mouth 3 (three) times daily. The timing of this medication is very important.   albuterol  (2.5 MG/3ML) 0.083% nebulizer solution Commonly known as: PROVENTIL  Take 3 mLs (2.5 mg total) by nebulization every 6 (six) hours as needed for wheezing or shortness of breath.   albuterol  108 (90 Base) MCG/ACT inhaler Commonly known as: VENTOLIN  HFA Inhale 2 puffs into the lungs every 6 (six) hours as needed for wheezing or shortness of breath.   ALPHA LIPOIC ACID PO Take 1 capsule by mouth daily.   ALPRAZolam  0.5 MG tablet Commonly known as: XANAX  Take 1 tablet (0.5 mg total) by mouth 2 (two) times daily as needed for anxiety. Must last 15 days.   aspirin  EC 81 MG tablet Take 1 tablet (81 mg) by mouth once daily. Swallow whole.   Compressor/Nebulizer Misc 1 each by Does not apply route as directed.   Entyvio  300 MG injection Generic drug: vedolizumab  Inject 300 mg into the vein every 30 (thirty) days.   estradiol  0.1 MG/GM vaginal cream Commonly known as: ESTRACE  Apply one pea-sized amount around the opening of the urethra 3 times weekly.   furosemide  20 MG tablet Commonly known as: LASIX  Take 1 tablet (20 mg total) by mouth daily as needed (for swelling in the legs accompanied by a weight gain of 3 pounds overnight -- USE  SPARINGLY).   isosorbide  mononitrate 30 MG 24 hr tablet Commonly known as: IMDUR  Take 0.5 tablets (15 mg total) by mouth daily.   MAGNESIUM  PO Take by mouth.   Mounjaro  7.5 MG/0.5ML Pen Generic drug: tirzepatide  INJECT 7.5MG  SUBCUTANEOUSLY  ONCE A WEEK   nitroGLYCERIN  0.4 MG SL tablet Commonly known as: NITROSTAT  Place 1 tablet (0.4 mg total) under the tongue every 5 (five) minutes as needed.   ondansetron  4 MG tablet Commonly known as: Zofran  Take 1 tablet (4 mg total) by mouth every 6 (six) hours.   ondansetron  8 MG disintegrating tablet Commonly known as: ZOFRAN -ODT Take 1 tablet (8 mg total) by mouth every 8 (eight) hours as needed for nausea or vomiting.   pantoprazole  40 MG tablet Commonly known as: PROTONIX  Take 1 tablet (40 mg total)  by mouth daily.   prednisoLONE acetate 1 % ophthalmic suspension Commonly known as: PRED FORTE Place 1 drop into both eyes 2 (two) times daily.   QUEtiapine  300 MG tablet Commonly known as: SEROQUEL  Take 1 tablet (300 mg total) by mouth at bedtime.   rosuvastatin  5 MG tablet Commonly known as: CRESTOR  TAKE 1 TABLET DAILY   sertraline  100 MG tablet Commonly known as: ZOLOFT  Take 1.5 tablets (150 mg total) by mouth daily.   triamcinolone  cream 0.1 % Commonly known as: KENALOG  Apply 1 Application topically 2 (two) times daily.   trimethoprim -polymyxin b ophthalmic solution Commonly known as: POLYTRIM SMARTSIG:In Eye(s)   ursodiol 300 MG capsule Commonly known as: ACTIGALL Take 300 mg by mouth 2 (two) times daily. Take 2 capsules in the AM and 1 capsule in the PM   VITAMIN B-12 PO Take 1 tablet by mouth daily.   Vitamin D  (Ergocalciferol ) 1.25 MG (50000 UNIT) Caps capsule Commonly known as: DRISDOL  Take 1 capsule (50,000 Units total) by mouth every 7 (seven) days.        Allergies:  Allergies[1]  Family History: Family History  Problem Relation Age of Onset   Colon cancer Mother    Stroke Father    Heart  disease Father    Heart failure Sister    Diabetes Paternal Grandmother    Bladder Cancer Neg Hx    Kidney disease Neg Hx    Prostate cancer Neg Hx    Kidney cancer Neg Hx    Pancreatic cancer Neg Hx    Esophageal cancer Neg Hx    Stomach cancer Neg Hx    Rectal cancer Neg Hx    Breast cancer Neg Hx     Social History:   reports that she quit smoking about 49 years ago. Her smoking use included cigarettes. She smoked an average of 0.5 packs per day. She has never used smokeless tobacco. She reports that she does not drink alcohol and does not use drugs.  Physical Exam: BP 99/66   Pulse 90   Ht 5' (1.524 m)   Wt 113 lb 14.4 oz (51.7 kg)   LMP  (LMP Unknown)   BMI 22.24 kg/m   Constitutional:  Alert and oriented, no acute distress, nontoxic appearing HEENT: [place], AT Cardiovascular: No clubbing, cyanosis, or edema Respiratory: Normal respiratory effort, no increased work of breathing Skin: No rashes, bruises or suspicious lesions Neurologic: Grossly intact, no focal deficits, moving all 4 extremities Psychiatric: Normal mood and affect  Laboratory Data: Results for orders placed or performed in visit on 08/06/24  Microscopic Examination   Collection Time: 08/06/24  9:07 AM   Urine  Result Value Ref Range   WBC, UA 0-5 0 - 5 /hpf   RBC, Urine 0-2 0 - 2 /hpf   Epithelial Cells (non renal) 0-10 0 - 10 /hpf   Bacteria, UA Many (A) None seen/Few  Urinalysis, Complete   Collection Time: 08/06/24  9:07 AM  Result Value Ref Range   Specific Gravity, UA 1.015 1.005 - 1.030   pH, UA 5.5 5.0 - 7.5   Color, UA Yellow Yellow   Appearance Ur Clear Clear   Leukocytes,UA Negative Negative   Protein,UA Negative Negative/Trace   Glucose, UA Trace (A) Negative   Ketones, UA Negative Negative   RBC, UA Negative Negative   Bilirubin, UA Negative Negative   Urobilinogen, Ur 0.2 0.2 - 1.0 mg/dL   Nitrite, UA Negative Negative   Microscopic Examination Comment  Microscopic Examination  See below:    *Note: Due to a large number of results and/or encounters for the requested time period, some results have not been displayed. A complete set of results can be found in Results Review.   Assessment & Plan:   1. Recurrent UTI (Primary) UA today notable only for bacteriuria, low suspicion for infection in the absence of pyuria.  That said, will send urine for culture and reassess symptoms when it results, treating if indicated.  Notably, with her recently recurrent Proteus positive urine cultures and history of stones, I will suggest repeat imaging when I contact her to rule out stone nidus - Urinalysis, Complete - CULTURE, URINE COMPREHENSIVE   Return for Will call with results.  Lucie Hones, PA-C  [facility] [place] [address], [address] [place], [place] [address] [phone]      [1]  Allergies Allergen Reactions   Avelox [Moxifloxacin Hcl In Nacl] Anaphylaxis   Bactrim  [Sulfamethoxazole -Trimethoprim ] Anaphylaxis   Ciprofloxacin  Other (See Comments)    Pt states she was told never to take this as it is in the same family as Avelox.    Bacitracin  Swelling    rash   Buspar  [Buspirone ] Other (See Comments)    hallucinations   Neomycin -Polymyxin-Dexameth Other (See Comments)    Burning in the eyes   Neosporin Original [Neomycin -Bacitracin  Zn-Polymyx] Swelling   Shellfish Allergy Hives and Swelling    Face and lips   Aquaphor [Lanolin-Petrolatum] Itching and Rash   Depakote [Divalproex Sodium] Other (See Comments)    Unknown Reaction   Imitrex  [Sumatriptan ] Other (See Comments)    Neck and shoulder pain   Stadol [Butorphanol] Rash   "

## 2024-08-11 ENCOUNTER — Encounter: Payer: Self-pay | Admitting: Physician Assistant

## 2024-08-11 ENCOUNTER — Ambulatory Visit: Admitting: Physician Assistant

## 2024-08-11 DIAGNOSIS — G4700 Insomnia, unspecified: Secondary | ICD-10-CM

## 2024-08-11 DIAGNOSIS — F411 Generalized anxiety disorder: Secondary | ICD-10-CM | POA: Diagnosis not present

## 2024-08-11 DIAGNOSIS — G2581 Restless legs syndrome: Secondary | ICD-10-CM | POA: Diagnosis not present

## 2024-08-11 DIAGNOSIS — F3110 Bipolar disorder, current episode manic without psychotic features, unspecified: Secondary | ICD-10-CM

## 2024-08-11 MED ORDER — ALPRAZOLAM 0.5 MG PO TABS: 0.5000 mg | ORAL_TABLET | Freq: Two times a day (BID) | ORAL | 5 refills | Status: AC | PRN

## 2024-08-11 MED ORDER — QUETIAPINE FUMARATE 400 MG PO TABS: 400.0000 mg | ORAL_TABLET | Freq: Every day | ORAL | 1 refills | Status: AC

## 2024-08-11 NOTE — Progress Notes (Signed)
 "     [facility] Med Check  Patient ID: Amy Hansen,  MRN: [id number]  PCP: Melvin Pao, NP  Date of Evaluation: 08/11/2024 Time spent:20 minutes  Chief Complaint:  Chief Complaint   Anxiety; Depression; Follow-up    HISTORY/CURRENT STATUS: For 2 month med check  Doing well. More energetic.  No extreme sadness, tearfulness, or feelings of hopelessness.  Not sleeping well.  Cleaning a lot.  Feels little manic.  Spending more, but not getting in trouble/debt b/c of it.  OCD is about the same.  She has been a little impulsive and had some irritability and anger, twice in the past few months she blew up about some things going on at home.  ADLs and personal hygiene are normal.   Denies any changes in concentration, making decisions, or remembering things.  Appetite has not changed.  No hallucinations.  No SI/HI.  Individual Medical History/ Review of Systems: Changes? :No      Past medications for mental health diagnoses include: Trazodone , Risperdal , Zoloft , Lunesta, prazosin, Sonata, Prozac, Depakote, Lamictal , lithium, Wellbutrin , Xanax , Ambien , carbamazepine , Seroquel , Buspar  caused falls and hallucinations, Gabapentin -she doesn't want to take  Allergies: Avelox [moxifloxacin hcl in nacl], Bactrim  [sulfamethoxazole -trimethoprim ], Ciprofloxacin , Bacitracin , Buspar  [buspirone ], Neomycin -polymyxin-dexameth, Neosporin original [neomycin -bacitracin  zn-polymyx], Shellfish allergy, Aquaphor [lanolin-petrolatum], Depakote [divalproex sodium], Imitrex  [sumatriptan ], and Stadol [butorphanol]  Current Medications:  Current Outpatient Medications:    albuterol  (PROVENTIL ) (2.5 MG/3ML) 0.083% nebulizer solution, Take 3 mLs (2.5 mg total) by nebulization every 6 (six) hours as needed for wheezing or shortness of breath., Disp: 150 mL, Rfl: 3   albuterol  (VENTOLIN  HFA) 108 (90 Base) MCG/ACT inhaler, Inhale 2 puffs into the lungs every 6 (six) hours as needed for wheezing or shortness of  breath., Disp: 18 g, Rfl: 5   ALPHA LIPOIC ACID PO, Take 1 capsule by mouth daily., Disp: , Rfl:    aspirin  EC 81 MG tablet, Take 1 tablet (81 mg) by mouth once daily. Swallow whole., Disp: , Rfl:    Cyanocobalamin  (VITAMIN B-12 PO), Take 1 tablet by mouth daily., Disp: , Rfl:    estradiol  (ESTRACE ) 0.1 MG/GM vaginal cream, Apply one pea-sized amount around the opening of the urethra 3 times weekly., Disp: 42.5 g, Rfl: 12   furosemide  (LASIX ) 20 MG tablet, Take 1 tablet (20 mg total) by mouth daily as needed (for swelling in the legs accompanied by a weight gain of 3 pounds overnight -- USE SPARINGLY)., Disp: 45 tablet, Rfl: 1   isosorbide  mononitrate (IMDUR ) 30 MG 24 hr tablet, Take 0.5 tablets (15 mg total) by mouth daily., Disp: 45 tablet, Rfl: 3   MAGNESIUM  PO, Take by mouth., Disp: , Rfl:    MOUNJARO  7.5 MG/0.5ML Pen, INJECT 7.5MG  SUBCUTANEOUSLY  ONCE A WEEK, Disp: 12 mL, Rfl: 0   Nebulizers (COMPRESSOR/NEBULIZER) MISC, 1 each by Does not apply route as directed., Disp: 1 each, Rfl: 0   nitroGLYCERIN  (NITROSTAT ) 0.4 MG SL tablet, Place 1 tablet (0.4 mg total) under the tongue every 5 (five) minutes as needed., Disp: 25 tablet, Rfl: 0   ondansetron  (ZOFRAN ) 4 MG tablet, Take 1 tablet (4 mg total) by mouth every 6 (six) hours., Disp: 10 tablet, Rfl: 0   ondansetron  (ZOFRAN -ODT) 8 MG disintegrating tablet, Take 1 tablet (8 mg total) by mouth every 8 (eight) hours as needed for nausea or vomiting., Disp: 60 tablet, Rfl: 1   pantoprazole  (PROTONIX ) 40 MG tablet, Take 1 tablet (40 mg total) by mouth daily., Disp: 90 tablet, Rfl:  0   pramipexole  (MIRAPEX ) 1 MG tablet, Take 1 tablet (1 mg total) by mouth at bedtime., Disp: 90 tablet, Rfl: 1   prednisoLONE acetate (PRED FORTE) 1 % ophthalmic suspension, Place 1 drop into both eyes 2 (two) times daily., Disp: , Rfl:    QUEtiapine  (SEROQUEL ) 400 MG tablet, Take 1 tablet (400 mg total) by mouth at bedtime., Disp: 30 tablet, Rfl: 1   rOPINIRole  (REQUIP ) 1  MG tablet, Take 1 tablet (1 mg total) by mouth 3 (three) times daily., Disp: 270 tablet, Rfl: 1   rosuvastatin  (CRESTOR ) 5 MG tablet, TAKE 1 TABLET DAILY, Disp: 90 tablet, Rfl: 0   sertraline  (ZOLOFT ) 100 MG tablet, Take 1.5 tablets (150 mg total) by mouth daily., Disp: , Rfl:    triamcinolone  cream (KENALOG ) 0.1 %, Apply 1 Application topically 2 (two) times daily., Disp: 30 g, Rfl: 0   trimethoprim -polymyxin b (POLYTRIM) ophthalmic solution, SMARTSIG:In Eye(s), Disp: , Rfl:    ursodiol (ACTIGALL) 300 MG capsule, Take 300 mg by mouth 2 (two) times daily. Take 2 capsules in the AM and 1 capsule in the PM, Disp: , Rfl:    vedolizumab  (ENTYVIO ) 300 MG injection, Inject 300 mg into the vein every 30 (thirty) days., Disp: , Rfl:    Vitamin D , Ergocalciferol , (DRISDOL ) 1.25 MG (50000 UNIT) CAPS capsule, Take 1 capsule (50,000 Units total) by mouth every 7 (seven) days., Disp: 12 capsule, Rfl: 1   ALPRAZolam  (XANAX ) 0.5 MG tablet, Take 1 tablet (0.5 mg total) by mouth 2 (two) times daily as needed for anxiety. Must last 15 days., Disp: 30 tablet, Rfl: 5 Medication Side Effects: none  Family Medical/ Social History: Changes?  no  MENTAL HEALTH EXAM:  There were no vitals taken for this visit.There is no height or weight on file to calculate BMI.  General Appearance: Casual and Well Groomed  Eye Contact:  Good  Speech:  Clear and Coherent and Normal Rate  Volume:  Normal  Mood:  Euthymic  Affect:  Congruent  Thought Process:  Goal Directed and Descriptions of Associations: Circumstantial  Orientation:  Full (Time, Place, and Person)  Thought Content: Logical   Suicidal Thoughts:  No  Homicidal Thoughts:  No  Memory:  at her baseline  Judgement:  Good  Insight:  Good  Psychomotor Activity:  Normal  Concentration:  Concentration: Good and Attention Span: Good  Recall:  Good  Fund of Knowledge: Good  Language: Good  Assets:  Communication Skills Desire for Improvement Financial  Resources/Insurance Housing Leisure Time Resilience Transportation   ADL's:  Intact  Cognition: WNL  Prognosis:  Good   PCP follows labs  DIAGNOSES:    ICD-10-CM   1. Bipolar I disorder with mania (HCC)  F31.10     2. Generalized anxiety disorder  F41.1     3. Restless legs syndrome (RLS)  G25.81     4. Insomnia, unspecified type  G47.00       Receiving Psychotherapy: No   RECOMMENDATIONS:  PDMP reviewed.  Xanax  filled 08/03/2024. I provided approximately 20 minutes of face to face time during this encounter, including time spent before and after the visit in records review, medical decision making, counseling pertinent to today's visit, and charting.   We discussed the mania.  I recommend increasing the Seroquel .  She agrees.  Continue Xanax  0.5 mg, 1 p.o. twice daily as needed.  DON'T FILL EARLY. EVER.     Continue Xanax  0.5 mg, 1 p.o. twice daily as needed. Continue pramipexole   1 mg, 1 p.o. nightly. Increase Seroquel  to 400 mg, 1 at bedtime.  Continue ropinirole  1 mg, 1 p.o. twice daily. Continue Zoloft   100 mg, 1.5 pills daily. Recommend counseling.  Return in 6 weeks.  Verneita Cooks, PA-C  "

## 2024-08-12 ENCOUNTER — Other Ambulatory Visit: Payer: Self-pay | Admitting: Gastroenterology

## 2024-08-12 ENCOUNTER — Ambulatory Visit (INDEPENDENT_AMBULATORY_CARE_PROVIDER_SITE_OTHER): Admitting: Nurse Practitioner

## 2024-08-12 ENCOUNTER — Encounter: Payer: Self-pay | Admitting: Nurse Practitioner

## 2024-08-12 ENCOUNTER — Ambulatory Visit
Admission: RE | Admit: 2024-08-12 | Discharge: 2024-08-12 | Disposition: A | Discharge status: Home / Self Care | Source: Ambulatory Visit | Attending: Nurse Practitioner | Admitting: Nurse Practitioner

## 2024-08-12 ENCOUNTER — Other Ambulatory Visit: Payer: Self-pay | Admitting: Physician Assistant

## 2024-08-12 VITALS — BP 120/77 | HR 86 | Temp 98.0°F | Ht 60.0 in | Wt 119.6 lb

## 2024-08-12 DIAGNOSIS — M25572 Pain in left ankle and joints of left foot: Secondary | ICD-10-CM | POA: Diagnosis not present

## 2024-08-12 DIAGNOSIS — D229 Melanocytic nevi, unspecified: Secondary | ICD-10-CM | POA: Diagnosis not present

## 2024-08-12 DIAGNOSIS — K743 Primary biliary cirrhosis: Secondary | ICD-10-CM

## 2024-08-12 NOTE — Progress Notes (Signed)
 "  BP 120/77 (BP Location: Left Arm, Patient Position: Sitting, Cuff Size: Normal)   Pulse 86   Temp 98 F (36.7 C) (Oral)   Ht 5' (1.524 m)   Wt 119 lb 9.6 oz (54.3 kg)   LMP  (LMP Unknown)   SpO2 99%   BMI 23.36 kg/m    Subjective:    Patient ID: Amy Hansen, female    DOB: [birth year], 69 y.o.   MRN: [id number]  HPI: Amy Hansen is a 69 y.o. female  Chief Complaint  Patient presents with   Acute Visit    Spot on left leg. Right side of ankle is bruised. Patient stated the spot on her leg was red for 3 months, but just recently became bruised. It is sensitive to the touch   Patient presents to clinic with complaints of bruising on her left ankle.  She doesn't remember any injuries.  But she has been in a manic episode and doing a lot more cleaning and up and down the stairs.  She does have bad neuropathy and wouldn't always notice when she hits herself.  She also has a round area on her left leg that has changed into bruising and raised now.      Relevant past medical, surgical, family and social history reviewed and updated as indicated. Interim medical history since our last visit reviewed. Allergies and medications reviewed and updated.  Review of Systems  Musculoskeletal:        Left ankle pain  Skin:  Positive for rash.    Per HPI unless specifically indicated above     Objective:    BP 120/77 (BP Location: Left Arm, Patient Position: Sitting, Cuff Size: Normal)   Pulse 86   Temp 98 F (36.7 C) (Oral)   Ht 5' (1.524 m)   Wt 119 lb 9.6 oz (54.3 kg)   LMP  (LMP Unknown)   SpO2 99%   BMI 23.36 kg/m   Wt Readings from Last 3 Encounters:  08/12/24 119 lb 9.6 oz (54.3 kg)  08/06/24 113 lb (51.3 kg)  08/06/24 113 lb 14.4 oz (51.7 kg)    Physical Exam Vitals and nursing note reviewed.  Constitutional:      General: She is not in acute distress.    Appearance: Normal appearance. She is normal weight. She is not ill-appearing, toxic-appearing or  diaphoretic.  HENT:     Head: Normocephalic.     Right Ear: External ear normal.     Left Ear: External ear normal.     Nose: Nose normal.     Mouth/Throat:     Mouth: Mucous membranes are moist.     Pharynx: Oropharynx is clear.  Eyes:     General:        Right eye: No discharge.        Left eye: No discharge.     Extraocular Movements: Extraocular movements intact.     Conjunctiva/sclera: Conjunctivae normal.     Pupils: Pupils are equal, round, and reactive to light.  Cardiovascular:     Rate and Rhythm: Normal rate and regular rhythm.     Heart sounds: No murmur heard. Pulmonary:     Effort: Pulmonary effort is normal. No respiratory distress.     Breath sounds: Normal breath sounds. No wheezing or rales.  Musculoskeletal:     Cervical back: Normal range of motion and neck supple.       Legs:     Comments: Left ankle pain  with palpation of medial malleolus. Bruising below medial malleolus.   Skin:    General: Skin is warm and dry.     Capillary Refill: Capillary refill takes less than 2 seconds.  Neurological:     General: No focal deficit present.     Mental Status: She is alert and oriented to person, place, and time. Mental status is at baseline.  Psychiatric:        Mood and Affect: Mood normal.        Behavior: Behavior normal.        Thought Content: Thought content normal.        Judgment: Judgment normal.     Results for orders placed or performed in visit on 08/06/24  Microscopic Examination   Collection Time: 08/06/24  9:07 AM   Urine  Result Value Ref Range   WBC, UA 0-5 0 - 5 /hpf   RBC, Urine 0-2 0 - 2 /hpf   Epithelial Cells (non renal) 0-10 0 - 10 /hpf   Bacteria, UA Many (A) None seen/Few  Urinalysis, Complete   Collection Time: 08/06/24  9:07 AM  Result Value Ref Range   Specific Gravity, UA 1.015 1.005 - 1.030   pH, UA 5.5 5.0 - 7.5   Color, UA Yellow Yellow   Appearance Ur Clear Clear   Leukocytes,UA Negative Negative   Protein,UA  Negative Negative/Trace   Glucose, UA Trace (A) Negative   Ketones, UA Negative Negative   RBC, UA Negative Negative   Bilirubin, UA Negative Negative   Urobilinogen, Ur 0.2 0.2 - 1.0 mg/dL   Nitrite, UA Negative Negative   Microscopic Examination Comment    Microscopic Examination See below:   CULTURE, URINE COMPREHENSIVE   Collection Time: 08/06/24  9:44 AM   Specimen: Urine   UR  Result Value Ref Range   Urine Culture, Comprehensive Preliminary report (A)    Organism ID, Bacteria Enterococcus faecalis (A)    Organism ID, Bacteria Comment    Organism ID, Bacteria Comment    ANTIMICROBIAL SUSCEPTIBILITY Comment    *Note: Due to a large number of results and/or encounters for the requested time period, some results have not been displayed. A complete set of results can be found in Results Review.      Assessment & Plan:   Problem List Items Addressed This Visit   None Visit Diagnoses       Acute left ankle pain    -  Primary   Will order xray. May need to send to Orthopedics for further evaluation and management.   Relevant Orders   DG Ankle Complete Left     Atypical mole       Referral placed for dermatology.   Relevant Orders   Ambulatory referral to Dermatology        Follow up plan: No follow-ups on file.      "

## 2024-08-13 MED ORDER — ISOSORBIDE MONONITRATE ER 30 MG PO TB24: 15.0000 mg | ORAL_TABLET | Freq: Every day | ORAL | 3 refills | Status: AC

## 2024-08-14 ENCOUNTER — Ambulatory Visit: Payer: Self-pay | Admitting: Physician Assistant

## 2024-08-14 DIAGNOSIS — R895 Abnormal microbiological findings in specimens from other organs, systems and tissues: Secondary | ICD-10-CM

## 2024-08-14 DIAGNOSIS — N200 Calculus of kidney: Secondary | ICD-10-CM

## 2024-08-14 LAB — CULTURE, URINE COMPREHENSIVE

## 2024-08-14 MED ORDER — NITROFURANTOIN MONOHYD MACRO 100 MG PO CAPS: 100.0000 mg | ORAL_CAPSULE | Freq: Two times a day (BID) | ORAL | 0 refills | Status: AC

## 2024-08-14 NOTE — Telephone Encounter (Signed)
 Spoke with patient about any urinary symptoms she is currently having. Patient stated she is still having the burning, discomfort, lower back/flank pain. Macrobid  100 mg sent into [facility]. Advised patient to take this antibiotic twice daily x 5 days. Patient verbalized understanding also stated she has been to the walk-in clinic 3 times as well so this is her 4th time being treated for a UTI and in the past she had to hospitalized to receive IV antibiotic.   Also, informed patient that several of her urine cultures since she had her ureteroscopy have grown Proteus, which is a bacteria that can be associated with kidney stones. Renal ultrasound order put in. Patient advised to call scheduling to schedule her an appointment. Scheduling number provided. Patient informed once results are received and reviewed a member from our clinical team will be in touch with next steps. Patient verbalized understanding.

## 2024-08-17 ENCOUNTER — Ambulatory Visit
Admission: RE | Admit: 2024-08-17 | Discharge: 2024-08-17 | Disposition: A | Discharge status: Home / Self Care | Source: Ambulatory Visit | Attending: Physician Assistant | Admitting: Physician Assistant

## 2024-08-17 DIAGNOSIS — N200 Calculus of kidney: Secondary | ICD-10-CM | POA: Insufficient documentation

## 2024-08-18 ENCOUNTER — Ambulatory Visit: Payer: Self-pay | Admitting: Physician Assistant

## 2024-08-19 ENCOUNTER — Ambulatory Visit: Payer: Self-pay | Admitting: Nurse Practitioner

## 2024-08-19 DIAGNOSIS — M25572 Pain in left ankle and joints of left foot: Secondary | ICD-10-CM

## 2024-08-24 ENCOUNTER — Ambulatory Visit

## 2024-08-25 ENCOUNTER — Ambulatory Visit: Admitting: Podiatry

## 2024-08-26 ENCOUNTER — Telehealth: Payer: Self-pay

## 2024-08-26 NOTE — Telephone Encounter (Signed)
 Pt LM on triage line she thinks she has a uti. Took at home test + leuks and + nitrites.   Last treated with Macrobid  on 1/16. [facility] - need to ask about sx.   Per SV - no ATBS at this time.

## 2024-08-27 ENCOUNTER — Other Ambulatory Visit: Payer: Self-pay

## 2024-08-27 ENCOUNTER — Other Ambulatory Visit

## 2024-08-27 ENCOUNTER — Ambulatory Visit

## 2024-08-27 DIAGNOSIS — L821 Other seborrheic keratosis: Secondary | ICD-10-CM | POA: Diagnosis not present

## 2024-08-27 DIAGNOSIS — L820 Inflamed seborrheic keratosis: Secondary | ICD-10-CM

## 2024-08-27 DIAGNOSIS — R300 Dysuria: Secondary | ICD-10-CM

## 2024-08-27 DIAGNOSIS — I872 Venous insufficiency (chronic) (peripheral): Secondary | ICD-10-CM | POA: Diagnosis not present

## 2024-08-27 LAB — URINALYSIS, COMPLETE
Bilirubin, UA: NEGATIVE
Glucose, UA: NEGATIVE
Ketones, UA: NEGATIVE
Nitrite, UA: POSITIVE — AB
Protein,UA: NEGATIVE
RBC, UA: NEGATIVE
Specific Gravity, UA: 1.015 (ref 1.005–1.030)
Urobilinogen, Ur: 0.2 mg/dL (ref 0.2–1.0)
pH, UA: 5.5 (ref 5.0–7.5)

## 2024-08-27 LAB — MICROSCOPIC EXAMINATION

## 2024-08-27 MED ORDER — TRIAMCINOLONE ACETONIDE 0.1 % EX OINT: TOPICAL_OINTMENT | CUTANEOUS | 5 refills | Status: AC

## 2024-08-27 NOTE — Progress Notes (Signed)
" °  °  Subjective   Amy Hansen is a 69 y.o. female who presents for the following: Lesion(s) of concern . Patient is new patient  Today patient reports: LOC on left lower extremity  LOC on right lower extremity   Review of Systems:    No other skin or systemic complaints except as noted in HPI or Assessment and Plan.  The following portions of the chart were reviewed this encounter and updated as appropriate: medications, allergies, medical history  Relevant Medical History:  CKD, Cirrhosis, T2DM, CHF  Objective  (SKPE) Well appearing patient in no apparent distress; mood and affect are within normal limits. Examination was performed of the: Focused Exam of: bilateral lower extremity    Examination notable for: Seborrheic Keratosis(es): Stuck-on appearing keratotic papule(s) on the trunk, none  irritated with redness, crusting, edema, and/or partial avulsion, Stasis dermatitis: Woody induration of lower legs with marked hyperpigmentation and orange discoloration. No ulcerations  Examination limited by: Clothing and Patient deferred removal       Assessment & Plan  (SKAP)   Stasis dermatitis - chronic, flaring, not at goal  - Informed the patient that this condition is caused by swelling in his legs and reducing this swelling is the ultimate treatment - Encouraged to elevate their legs for 1 hour three times a day - Recommend compression stockings daily - start triamcinolone  ointment 0.1% twice daily to affected areas of skin Discussed side effect of potent topical steroids including atrophy, dyspigmentation, striae, telangectasia, folliculitis, loss of skin pigment, hair growth, tachyphylaxis, risk of systemic absorption with missuse. - Recommend discussion of lasix  dose adjustment w/PCP or nephrologist   Seborrheic keratosis of the left lower extremity  - Discussed diagnosis, typical course, and treatment options for this condition - Reassurance, benign, monitor - ABCDE's  discussed - treatment deferred as lesion is not bothersome at this time       Was sun protection counseling provided?: No   Level of service outlined above   Patient instructions (SKPI)   Procedures, orders, diagnosis for this visit:    There are no diagnoses linked to this encounter.  Return to clinic: Return if symptoms worsen or fail to improve.  I, Emerick Ege, CMA am acting as scribe for Lauraine JAYSON Kanaris, MD.   Documentation: I have reviewed the above documentation for accuracy and completeness, and I agree with the above.  Lauraine JAYSON Kanaris, MD  "

## 2024-08-27 NOTE — Patient Instructions (Signed)

## 2024-08-28 ENCOUNTER — Ambulatory Visit: Admitting: Physician Assistant

## 2024-08-28 VITALS — BP 118/73 | HR 85 | Ht 60.0 in | Wt 116.0 lb

## 2024-08-28 DIAGNOSIS — R300 Dysuria: Secondary | ICD-10-CM | POA: Diagnosis not present

## 2024-08-28 NOTE — Progress Notes (Signed)
 "  08/28/2024 10:03 AM   Amy Hansen 12/23/1955 [id number]  CC: Chief Complaint  Patient presents with   Dysuria   HPI: Amy Hansen is a 69 y.o. 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   She has completed several recent rounds of antibiotics for possible UTI, most recently 5 days of culture appropriate Macrobid  earlier this month for a polymicrobial urine culture, though based on colony counts I suspect this may have been a contaminated sample.  She had a renal ultrasound on 08/17/2024 that showed no hydronephrosis or shadowing stones.  Today she reports persistent dysuria, bladder spasms, and back/left flank ache but can be rather severe.  She has been having chills, but denies fevers.  She has been having rather marked diarrhea.  She does monthly infusions for her Crohn's and has been staying well-hydrated.  She voids frequently due to Lasix .  In-office UA yesterday notable for 1+ leukocytes and nitrites; urine microscopy with 11-30 WBCs/hpf and many bacteria.  PMH: Past Medical History:  Diagnosis Date   Acute on chronic heart failure with preserved ejection fraction (HFpEF) (HCC) 05/16/2018   AKI (acute kidney injury) 02/05/2023   Anemia    Anxiety    Arthritis    Blood transfusion without reported diagnosis    Cataract    CHF (congestive heart failure) (HCC)    Chronic kidney disease    UTI, hematuria in urine   Colitis    Crohn's disease (HCC)    Depression    Diabetes (HCC)    Diverticulosis    Elevated liver enzymes    Frequent headaches    Grade I diastolic dysfunction    Interstitial cystitis    PONV (postoperative nausea and vomiting)    Primary biliary cirrhosis (HCC)    Recurrent UTI    Restless leg syndrome    TIA (transient ischemic attack) 02/20/2021    Urinary frequency     Surgical History: Past Surgical History:  Procedure Laterality Date   bariatric bypass  2012   BIOPSY  05/03/2020   Procedure: BIOPSY;  Surgeon: Teressa Toribio SQUIBB, MD;  Location: [facility] ENDOSCOPY;  Service: Endoscopy;;   CARPAL TUNNEL RELEASE Right 2003   CARPAL TUNNEL RELEASE Right    2008   CHOLECYSTECTOMY  1975   COLONOSCOPY     COLONOSCOPY WITH PROPOFOL  N/A 05/03/2020   Procedure: COLONOSCOPY WITH PROPOFOL ;  Surgeon: Teressa Toribio SQUIBB, MD;  Location: [facility] ENDOSCOPY;  Service: Endoscopy;  Laterality: N/A;   CORONARY STENT INTERVENTION N/A 05/15/2023   Procedure: CORONARY STENT INTERVENTION;  Surgeon: Mady Bruckner, MD;  Location: [facility] INVASIVE CV LAB;  Service: Cardiovascular;  Laterality: N/A;   CYSTOSCOPY W/ RETROGRADES Bilateral 06/06/2015   Procedure: CYSTOSCOPY WITH RETROGRADE PYELOGRAM;  Surgeon: Donnice Brooks, MD;  Location: [facility] ORS;  Service: Urology;  Laterality: Bilateral;   CYSTOSCOPY/URETEROSCOPY/HOLMIUM LASER/STENT PLACEMENT Left 03/02/2024   Procedure: CYSTOSCOPY/URETEROSCOPY/HOLMIUM LASER/STENT PLACEMENT;  Surgeon: Francisca Redell BROCKS, MD;  Location: [facility] ORS;  Service: Urology;  Laterality: Left;   EYE SURGERY Left 07/06/2022   FL INJ LEFT KNEE CT ARTHROGRAM ([facility] HX) Left    1995   GASTRIC BYPASS  2010   HAND SURGERY Left 01/19/2021   Thumb   HEMORRHOID SURGERY  2013   KNEE ARTHROSCOPY Left 1996   LEFT HEART CATH AND CORONARY ANGIOGRAPHY Left 05/15/2023   Procedure: LEFT  HEART CATH AND CORONARY ANGIOGRAPHY;  Surgeon: Mady Bruckner, MD;  Location: [facility] INVASIVE CV LAB;  Service: Cardiovascular;  Laterality: Left;   TONSILLECTOMY     TOTAL ABDOMINAL HYSTERECTOMY W/ BILATERAL SALPINGOOPHORECTOMY      Home Medications:  Allergies as of 08/28/2024       Reactions   Avelox [moxifloxacin Hcl In Nacl] Anaphylaxis   Bactrim  [sulfamethoxazole -trimethoprim ] Anaphylaxis   Ciprofloxacin  Other (See Comments)   Pt states she was told never to take  this as it is in the same family as Avelox.    Bacitracin  Swelling   rash   Buspar  [buspirone ] Other (See Comments)   hallucinations   Neomycin -polymyxin-dexameth Other (See Comments)   Burning in the eyes   Neosporin Original [neomycin -bacitracin  Zn-polymyx] Swelling   Shellfish Allergy Hives, Swelling   Face and lips   Aquaphor [lanolin-petrolatum] Itching, Rash   Depakote [divalproex Sodium] Other (See Comments)   Unknown Reaction   Imitrex  [sumatriptan ] Other (See Comments)   Neck and shoulder pain   Stadol [butorphanol] Rash        Medication List        Accurate as of August 28, 2024 10:03 AM. If you have any questions, ask your nurse or doctor.          STOP taking these medications    prednisoLONE acetate 1 % ophthalmic suspension Commonly known as: PRED FORTE   trimethoprim -polymyxin b ophthalmic solution Commonly known as: POLYTRIM       TAKE these medications    pramipexole  1 MG tablet Commonly known as: MIRAPEX  Take 1 tablet (1 mg total) by mouth at bedtime. The timing of this medication is very important.   rOPINIRole  1 MG tablet Commonly known as: REQUIP  Take 1 tablet (1 mg total) by mouth 3 (three) times daily. The timing of this medication is very important.   albuterol  (2.5 MG/3ML) 0.083% nebulizer solution Commonly known as: PROVENTIL  Take 3 mLs (2.5 mg total) by nebulization every 6 (six) hours as needed for wheezing or shortness of breath.   albuterol  108 (90 Base) MCG/ACT inhaler Commonly known as: VENTOLIN  HFA Inhale 2 puffs into the lungs every 6 (six) hours as needed for wheezing or shortness of breath.   ALPHA LIPOIC ACID PO Take 1 capsule by mouth daily.   ALPRAZolam  0.5 MG tablet Commonly known as: XANAX  Take 1 tablet (0.5 mg total) by mouth 2 (two) times daily as needed for anxiety. Must last 15 days.   aspirin  EC 81 MG tablet Take 1 tablet (81 mg) by mouth once daily. Swallow whole.   Compressor/Nebulizer Misc 1  each by Does not apply route as directed.   Entyvio  300 MG injection Generic drug: vedolizumab  Inject 300 mg into the vein every 30 (thirty) days.   estradiol  0.1 MG/GM vaginal cream Commonly known as: ESTRACE  Apply one pea-sized amount around the opening of the urethra 3 times weekly.   furosemide  20 MG tablet Commonly known as: LASIX  Take 1 tablet (20 mg total) by mouth daily as needed (for swelling in the legs accompanied by a weight gain of 3 pounds overnight -- USE SPARINGLY).   isosorbide  mononitrate 30 MG 24 hr tablet Commonly known as: IMDUR  Take 0.5 tablets (15 mg total) by mouth daily.   MAGNESIUM  PO Take by mouth.   Mounjaro  7.5 MG/0.5ML Pen Generic drug: tirzepatide  INJECT 7.5MG  SUBCUTANEOUSLY  ONCE A WEEK   nitroGLYCERIN  0.4 MG SL tablet Commonly known as: NITROSTAT  Place 1 tablet (0.4 mg total) under the tongue  every 5 (five) minutes as needed.   ondansetron  4 MG tablet Commonly known as: Zofran  Take 1 tablet (4 mg total) by mouth every 6 (six) hours.   ondansetron  8 MG disintegrating tablet Commonly known as: ZOFRAN -ODT Take 1 tablet (8 mg total) by mouth every 8 (eight) hours as needed for nausea or vomiting.   pantoprazole  40 MG tablet Commonly known as: PROTONIX  Take 1 tablet (40 mg total) by mouth daily.   QUEtiapine  400 MG tablet Commonly known as: SEROQUEL  Take 1 tablet (400 mg total) by mouth at bedtime.   QUEtiapine  200 MG tablet Commonly known as: SEROQUEL  Take 200 mg by mouth 2 (two) times daily.   rosuvastatin  5 MG tablet Commonly known as: CRESTOR  TAKE 1 TABLET DAILY   sertraline  100 MG tablet Commonly known as: ZOLOFT  Take 1.5 tablets (150 mg total) by mouth daily.   triamcinolone  cream 0.1 % Commonly known as: KENALOG  Apply 1 Application topically 2 (two) times daily.   triamcinolone  ointment 0.1 % Commonly known as: KENALOG  Apply 1 gram twice daily to affected areas of skin. Stop once resolved and restart as needed for  flares. Avoid use on face, armpits, groin unless otherwise indicated.   ursodiol 300 MG capsule Commonly known as: ACTIGALL Take 300 mg by mouth 2 (two) times daily. Take 2 capsules in the AM and 1 capsule in the PM   VITAMIN B-12 PO Take 1 tablet by mouth daily.   Vitamin D  (Ergocalciferol ) 1.25 MG (50000 UNIT) Caps capsule Commonly known as: DRISDOL  Take 1 capsule (50,000 Units total) by mouth every 7 (seven) days.        Allergies:  Allergies[1]  Family History: Family History  Problem Relation Age of Onset   Colon cancer Mother    Stroke Father    Heart disease Father    Heart failure Sister    Diabetes Paternal Grandmother    Bladder Cancer Neg Hx    Kidney disease Neg Hx    Prostate cancer Neg Hx    Kidney cancer Neg Hx    Pancreatic cancer Neg Hx    Esophageal cancer Neg Hx    Stomach cancer Neg Hx    Rectal cancer Neg Hx    Breast cancer Neg Hx     Social History:   reports that she quit smoking about 49 years ago. Her smoking use included cigarettes. She smoked an average of 0.5 packs per day. She has never used smokeless tobacco. She reports that she does not drink alcohol and does not use drugs.  Physical Exam: BP 118/73 (BP Location: Left Arm, Patient Position: Sitting, Cuff Size: Normal)   Pulse 85   Ht 5' (1.524 m)   Wt 116 lb (52.6 kg)   LMP  (LMP Unknown)   SpO2 92%   BMI 22.65 kg/m   Constitutional:  Alert and oriented, no acute distress, nontoxic appearing HEENT: [place], AT Cardiovascular: No clubbing, cyanosis, or edema Respiratory: Normal respiratory effort, no increased work of breathing Skin: No rashes, bruises or suspicious lesions Neurologic: Grossly intact, no focal deficits, moving all 4 extremities Psychiatric: Normal mood and affect  Laboratory Data: Results for orders placed or performed in visit on 08/27/24  Microscopic Examination   Collection Time: 08/27/24 10:33 AM   Urine  Result Value Ref Range   WBC, UA 11-30 (A) 0 - 5  /hpf   RBC, Urine 0-2 0 - 2 /hpf   Epithelial Cells (non renal) 0-10 0 - 10 /hpf   Bacteria,  UA Many (A) None seen/Few  Urinalysis, Complete   Collection Time: 08/27/24 10:33 AM  Result Value Ref Range   Specific Gravity, UA 1.015 1.005 - 1.030   pH, UA 5.5 5.0 - 7.5   Color, UA Yellow Yellow   Appearance Ur Clear Clear   Leukocytes,UA 1+ (A) Negative   Protein,UA Negative Negative/Trace   Glucose, UA Negative Negative   Ketones, UA Negative Negative   RBC, UA Negative Negative   Bilirubin, UA Negative Negative   Urobilinogen, Ur 0.2 0.2 - 1.0 mg/dL   Nitrite, UA Positive (A) Negative   Microscopic Examination See below:    *Note: Due to a large number of results and/or encounters for the requested time period, some results have not been displayed. A complete set of results can be found in Results Review.   Assessment & Plan:   1. Dysuria (Primary) Diagnosis is very challenging in this case with her chronic bacteriuria, IC, and Crohn's, however given no symptomatic improvement with culture appropriate antibiotics, my suspicion for an infectious etiology of her symptoms is low.  I think much more likely would be an IC flare or Crohn's.  Unclear if diarrhea is due to Crohn's flare or recent antibiotics, and probiotics are contraindicated with her Crohn's.  Unfortunately, she remains a poor candidate for amitriptyline due to other serotonergic medications.  I offered her bladder rescue to treat a possible IC flare, and we discussed that if this is not successful it makes me more strongly suspect Crohn's as the etiology of her symptoms.  She agrees and would like to defer additional antibiotics at this time, which is reasonable.  I encouraged her to follow-up with GI as well to get their input.  Return for Bladder rescue installations 2-3x/week x3 weeks.  Lucie Hones, PA-C  [facility] [place] [address], [address] [place], [place] [address] [phone]      [1]  Allergies Allergen Reactions   Avelox [Moxifloxacin Hcl In Nacl] Anaphylaxis   Bactrim  [Sulfamethoxazole -Trimethoprim ] Anaphylaxis   Ciprofloxacin  Other (See Comments)    Pt states she was told never to take this as it is in the same family as Avelox.    Bacitracin  Swelling    rash   Buspar  [Buspirone ] Other (See Comments)    hallucinations   Neomycin -Polymyxin-Dexameth Other (See Comments)    Burning in the eyes   Neosporin Original [Neomycin -Bacitracin  Zn-Polymyx] Swelling   Shellfish Allergy Hives and Swelling    Face and lips   Aquaphor [Lanolin-Petrolatum] Itching and Rash   Depakote [Divalproex Sodium] Other (See Comments)    Unknown Reaction   Imitrex  [Sumatriptan ] Other (See Comments)    Neck and shoulder pain   Stadol [Butorphanol] Rash   "

## 2024-08-31 ENCOUNTER — Ambulatory Visit: Admitting: Gastroenterology

## 2024-08-31 ENCOUNTER — Ambulatory Visit: Admitting: Physician Assistant

## 2024-08-31 LAB — CULTURE, URINE COMPREHENSIVE

## 2024-09-01 ENCOUNTER — Ambulatory Visit: Admitting: Podiatry

## 2024-09-02 ENCOUNTER — Ambulatory Visit (INDEPENDENT_AMBULATORY_CARE_PROVIDER_SITE_OTHER)

## 2024-09-02 ENCOUNTER — Ambulatory Visit: Admitting: Physician Assistant

## 2024-09-02 VITALS — BP 106/67 | HR 87 | Temp 98.2°F | Resp 18 | Ht 60.0 in | Wt 123.4 lb

## 2024-09-02 DIAGNOSIS — K529 Noninfective gastroenteritis and colitis, unspecified: Secondary | ICD-10-CM

## 2024-09-02 MED ORDER — VEDOLIZUMAB 300 MG IV SOLR
300.0000 mg | Freq: Once | INTRAVENOUS | Status: AC
  Administered 2024-09-02: 300 mg via INTRAVENOUS
  Filled 2024-09-02: qty 5

## 2024-09-02 NOTE — Progress Notes (Signed)
 Diagnosis: Pancolitis   Provider:  Praveen Mannam MD  Procedure: IV Infusion  IV Type: Peripheral, IV Location: L Antecubital  Entyvio  (Vedolizumab ), Dose: 300 mg  Infusion Start Time: 1119  Infusion Stop Time: 1152  Post Infusion IV Care: Peripheral IV Discontinued  Discharge: Condition: Good, Destination: Home . AVS Provided  Performed by:  Tawyna Pellot, RN

## 2024-09-03 ENCOUNTER — Ambulatory Visit

## 2024-09-04 ENCOUNTER — Ambulatory Visit: Admitting: Physician Assistant

## 2024-09-04 DIAGNOSIS — R300 Dysuria: Secondary | ICD-10-CM

## 2024-09-07 ENCOUNTER — Ambulatory Visit: Admitting: Physician Assistant

## 2024-09-09 ENCOUNTER — Ambulatory Visit: Admitting: Physician Assistant

## 2024-09-11 ENCOUNTER — Ambulatory Visit: Admitting: Urology

## 2024-09-14 ENCOUNTER — Ambulatory Visit

## 2024-09-16 ENCOUNTER — Ambulatory Visit

## 2024-09-18 ENCOUNTER — Ambulatory Visit: Admitting: Physician Assistant

## 2024-09-21 ENCOUNTER — Ambulatory Visit: Admitting: Physician Assistant

## 2024-09-23 ENCOUNTER — Ambulatory Visit: Admitting: Physician Assistant

## 2024-10-02 ENCOUNTER — Ambulatory Visit

## 2024-10-26 ENCOUNTER — Ambulatory Visit: Admitting: Nurse Practitioner

## 2024-12-18 ENCOUNTER — Ambulatory Visit: Admitting: Physician Assistant
